# Patient Record
Sex: Male | Born: 1965 | Race: Black or African American | Hispanic: No | Marital: Single | Smoking: Never smoker
Health system: Southern US, Community
[De-identification: ages and names within clinical notes are randomized; demographics above are authoritative.]

## PROBLEM LIST (undated history)

## (undated) DIAGNOSIS — N186 End stage renal disease: Secondary | ICD-10-CM

## (undated) DIAGNOSIS — M4640 Discitis, unspecified, site unspecified: Secondary | ICD-10-CM

## (undated) DIAGNOSIS — E119 Type 2 diabetes mellitus without complications: Secondary | ICD-10-CM

## (undated) DIAGNOSIS — I10 Essential (primary) hypertension: Secondary | ICD-10-CM

## (undated) DIAGNOSIS — Z992 Dependence on renal dialysis: Secondary | ICD-10-CM

## (undated) DIAGNOSIS — Z9189 Other specified personal risk factors, not elsewhere classified: Secondary | ICD-10-CM

## (undated) DIAGNOSIS — G4730 Sleep apnea, unspecified: Secondary | ICD-10-CM

## (undated) DIAGNOSIS — J9621 Acute and chronic respiratory failure with hypoxia: Secondary | ICD-10-CM

## (undated) HISTORY — PX: OTHER SURGICAL HISTORY: SHX169

## (undated) HISTORY — DX: Other specified personal risk factors, not elsewhere classified: Z91.89

---
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
## 2000-02-13 ENCOUNTER — Emergency Department (HOSPITAL_COMMUNITY): Admission: EM | Admit: 2000-02-13 | Discharge: 2000-02-13 | Payer: Self-pay | Admitting: Emergency Medicine

## 2003-08-15 ENCOUNTER — Emergency Department (HOSPITAL_COMMUNITY): Admission: EM | Admit: 2003-08-15 | Discharge: 2003-08-15 | Payer: Self-pay | Admitting: Emergency Medicine

## 2003-10-16 ENCOUNTER — Emergency Department (HOSPITAL_COMMUNITY): Admission: EM | Admit: 2003-10-16 | Discharge: 2003-10-16 | Payer: Self-pay | Admitting: Emergency Medicine

## 2003-10-18 ENCOUNTER — Emergency Department (HOSPITAL_COMMUNITY): Admission: EM | Admit: 2003-10-18 | Discharge: 2003-10-18 | Payer: Self-pay | Admitting: Emergency Medicine

## 2003-10-27 ENCOUNTER — Emergency Department (HOSPITAL_COMMUNITY): Admission: EM | Admit: 2003-10-27 | Discharge: 2003-10-27 | Payer: Self-pay | Admitting: *Deleted

## 2004-05-30 ENCOUNTER — Emergency Department (HOSPITAL_COMMUNITY): Admission: AD | Admit: 2004-05-30 | Discharge: 2004-05-31 | Payer: Self-pay | Admitting: Emergency Medicine

## 2004-06-02 ENCOUNTER — Emergency Department (HOSPITAL_COMMUNITY): Admission: EM | Admit: 2004-06-02 | Discharge: 2004-06-02 | Payer: Self-pay | Admitting: Emergency Medicine

## 2004-06-19 ENCOUNTER — Emergency Department (HOSPITAL_COMMUNITY): Admission: EM | Admit: 2004-06-19 | Discharge: 2004-06-19 | Payer: Self-pay | Admitting: Emergency Medicine

## 2004-06-28 ENCOUNTER — Ambulatory Visit: Payer: Self-pay | Admitting: *Deleted

## 2004-07-20 ENCOUNTER — Ambulatory Visit: Payer: Self-pay | Admitting: Family Medicine

## 2004-07-21 ENCOUNTER — Emergency Department (HOSPITAL_COMMUNITY): Admission: EM | Admit: 2004-07-21 | Discharge: 2004-07-21 | Payer: Self-pay | Admitting: Emergency Medicine

## 2004-07-28 ENCOUNTER — Ambulatory Visit: Payer: Self-pay | Admitting: Family Medicine

## 2004-11-08 ENCOUNTER — Ambulatory Visit: Payer: Self-pay | Admitting: Family Medicine

## 2005-04-01 ENCOUNTER — Emergency Department (HOSPITAL_COMMUNITY): Admission: EM | Admit: 2005-04-01 | Discharge: 2005-04-01 | Payer: Self-pay | Admitting: Emergency Medicine

## 2005-05-25 ENCOUNTER — Ambulatory Visit: Payer: Self-pay | Admitting: Family Medicine

## 2005-07-13 ENCOUNTER — Ambulatory Visit: Payer: Self-pay | Admitting: Family Medicine

## 2005-12-02 ENCOUNTER — Ambulatory Visit: Payer: Self-pay | Admitting: Family Medicine

## 2005-12-17 ENCOUNTER — Emergency Department (HOSPITAL_COMMUNITY): Admission: EM | Admit: 2005-12-17 | Discharge: 2005-12-17 | Payer: Self-pay | Admitting: *Deleted

## 2005-12-18 ENCOUNTER — Emergency Department (HOSPITAL_COMMUNITY): Admission: EM | Admit: 2005-12-18 | Discharge: 2005-12-18 | Payer: Self-pay | Admitting: *Deleted

## 2006-02-03 ENCOUNTER — Ambulatory Visit: Payer: Self-pay | Admitting: Family Medicine

## 2006-03-30 ENCOUNTER — Ambulatory Visit: Payer: Self-pay | Admitting: Family Medicine

## 2006-04-06 ENCOUNTER — Ambulatory Visit: Payer: Self-pay | Admitting: Family Medicine

## 2007-06-07 ENCOUNTER — Telehealth (INDEPENDENT_AMBULATORY_CARE_PROVIDER_SITE_OTHER): Payer: Self-pay | Admitting: *Deleted

## 2007-06-13 ENCOUNTER — Ambulatory Visit: Payer: Self-pay | Admitting: Family Medicine

## 2007-06-20 DIAGNOSIS — I10 Essential (primary) hypertension: Secondary | ICD-10-CM

## 2007-06-20 DIAGNOSIS — Z9189 Other specified personal risk factors, not elsewhere classified: Secondary | ICD-10-CM

## 2007-06-20 DIAGNOSIS — R809 Proteinuria, unspecified: Secondary | ICD-10-CM

## 2007-06-20 DIAGNOSIS — E1159 Type 2 diabetes mellitus with other circulatory complications: Secondary | ICD-10-CM | POA: Insufficient documentation

## 2007-06-20 DIAGNOSIS — E785 Hyperlipidemia, unspecified: Secondary | ICD-10-CM

## 2007-06-20 DIAGNOSIS — E1169 Type 2 diabetes mellitus with other specified complication: Secondary | ICD-10-CM

## 2007-06-20 HISTORY — DX: Other specified personal risk factors, not elsewhere classified: Z91.89

## 2007-06-27 ENCOUNTER — Encounter (INDEPENDENT_AMBULATORY_CARE_PROVIDER_SITE_OTHER): Payer: Self-pay | Admitting: *Deleted

## 2007-11-02 ENCOUNTER — Ambulatory Visit: Payer: Self-pay | Admitting: Nurse Practitioner

## 2007-11-27 ENCOUNTER — Ambulatory Visit: Payer: Self-pay | Admitting: Family Medicine

## 2007-12-03 ENCOUNTER — Encounter (INDEPENDENT_AMBULATORY_CARE_PROVIDER_SITE_OTHER): Payer: Self-pay | Admitting: Family Medicine

## 2007-12-03 LAB — CONVERTED CEMR LAB
ALT: 12 units/L (ref 0–53)
AST: 12 units/L (ref 0–37)
Albumin: 3.7 g/dL (ref 3.5–5.2)
Alkaline Phosphatase: 49 units/L (ref 39–117)
BUN: 16 mg/dL (ref 6–23)
Basophils Absolute: 0 10*3/uL (ref 0.0–0.1)
Basophils Relative: 0 % (ref 0–1)
CO2: 27 meq/L (ref 19–32)
Calcium: 9.3 mg/dL (ref 8.4–10.5)
Chloride: 101 meq/L (ref 96–112)
Cholesterol: 187 mg/dL (ref 0–200)
Creatinine, Ser: 1 mg/dL (ref 0.40–1.50)
Eosinophils Absolute: 0.5 10*3/uL (ref 0.0–0.7)
Eosinophils Relative: 5 % (ref 0–5)
Glucose, Bld: 207 mg/dL — ABNORMAL HIGH (ref 70–99)
HCT: 42.8 % (ref 39.0–52.0)
HDL: 64 mg/dL (ref >39)
Hemoglobin: 13.4 g/dL (ref 13.0–17.0)
LDL Cholesterol: 91 mg/dL (ref 0–99)
Lymphocytes Relative: 36 % (ref 12–46)
Lymphs Abs: 3.2 10*3/uL (ref 0.7–4.0)
MCHC: 31.3 g/dL (ref 30.0–36.0)
MCV: 89.7 fL (ref 78.0–100.0)
Monocytes Absolute: 0.5 10*3/uL (ref 0.1–1.0)
Monocytes Relative: 5 % (ref 3–12)
Neutro Abs: 4.7 10*3/uL (ref 1.7–7.7)
Neutrophils Relative %: 54 % (ref 43–77)
Platelets: 329 10*3/uL (ref 150–400)
Potassium: 4.8 meq/L (ref 3.5–5.3)
RBC: 4.77 M/uL (ref 4.22–5.81)
RDW: 13.3 % (ref 11.5–15.5)
Sodium: 139 meq/L (ref 135–145)
TSH: 2.431 microintl units/mL (ref 0.350–5.50)
Total Bilirubin: 0.5 mg/dL (ref 0.3–1.2)
Total CHOL/HDL Ratio: 2.9
Total Protein: 7.4 g/dL (ref 6.0–8.3)
Triglycerides: 162 mg/dL — ABNORMAL HIGH (ref <150)
VLDL: 32 mg/dL (ref 0–40)
WBC: 8.8 10*3/uL (ref 4.0–10.5)

## 2007-12-11 LAB — CONVERTED CEMR LAB: Hgb A1c MFr Bld: 9.7 % — ABNORMAL HIGH (ref 4.6–6.1)

## 2007-12-17 ENCOUNTER — Ambulatory Visit: Payer: Self-pay | Admitting: Nurse Practitioner

## 2007-12-17 LAB — CONVERTED CEMR LAB: Blood Glucose, Fingerstick: 222

## 2008-06-20 ENCOUNTER — Ambulatory Visit: Payer: Self-pay | Admitting: Nurse Practitioner

## 2008-06-20 LAB — CONVERTED CEMR LAB
Bilirubin Urine: NEGATIVE
Blood Glucose, Fingerstick: 136
Glucose, Urine, Semiquant: NEGATIVE
Hgb A1c MFr Bld: 9.3 %
Nitrite: NEGATIVE
Protein, U semiquant: >=300
Specific Gravity, Urine: >=1.03
Urobilinogen, UA: 1
WBC Urine, dipstick: NEGATIVE
pH: 5.5

## 2011-08-30 ENCOUNTER — Emergency Department (INDEPENDENT_AMBULATORY_CARE_PROVIDER_SITE_OTHER)
Admission: EM | Admit: 2011-08-30 | Discharge: 2011-08-30 | Disposition: A | Discharge status: Nursing Home (Includes ICF) | Payer: Self-pay | Source: Home / Self Care | Attending: Emergency Medicine | Admitting: Emergency Medicine

## 2011-08-30 ENCOUNTER — Encounter: Payer: Self-pay | Admitting: Emergency Medicine

## 2011-08-30 ENCOUNTER — Encounter (HOSPITAL_COMMUNITY): Payer: Self-pay | Admitting: Emergency Medicine

## 2011-08-30 ENCOUNTER — Emergency Department (HOSPITAL_COMMUNITY)
Admission: EM | Admit: 2011-08-30 | Discharge: 2011-08-31 | Disposition: A | Discharge status: Home / Self Care | Payer: Self-pay | Attending: Emergency Medicine | Admitting: Emergency Medicine

## 2011-08-30 DIAGNOSIS — N179 Acute kidney failure, unspecified: Secondary | ICD-10-CM | POA: Insufficient documentation

## 2011-08-30 DIAGNOSIS — Z79899 Other long term (current) drug therapy: Secondary | ICD-10-CM | POA: Insufficient documentation

## 2011-08-30 DIAGNOSIS — I10 Essential (primary) hypertension: Secondary | ICD-10-CM | POA: Insufficient documentation

## 2011-08-30 DIAGNOSIS — E119 Type 2 diabetes mellitus without complications: Secondary | ICD-10-CM | POA: Insufficient documentation

## 2011-08-30 DIAGNOSIS — N19 Unspecified kidney failure: Secondary | ICD-10-CM

## 2011-08-30 DIAGNOSIS — Z794 Long term (current) use of insulin: Secondary | ICD-10-CM | POA: Insufficient documentation

## 2011-08-30 HISTORY — DX: Essential (primary) hypertension: I10

## 2011-08-30 LAB — POCT I-STAT, CHEM 8
BUN: 46 mg/dL — ABNORMAL HIGH (ref 6–23)
Calcium, Ion: 1.1 mmol/L — ABNORMAL LOW (ref 1.12–1.32)
Chloride: 103 mEq/L (ref 96–112)
Creatinine, Ser: 4 mg/dL — ABNORMAL HIGH (ref 0.50–1.35)
Glucose, Bld: 116 mg/dL — ABNORMAL HIGH (ref 70–99)
HCT: 35 % — ABNORMAL LOW (ref 39.0–52.0)
Hemoglobin: 11.9 g/dL — ABNORMAL LOW (ref 13.0–17.0)
Potassium: 4.4 mEq/L (ref 3.5–5.1)
Sodium: 139 mEq/L (ref 135–145)
TCO2: 30 mmol/L (ref 0–100)

## 2011-08-30 LAB — GLUCOSE, CAPILLARY
Glucose-Capillary: 135 mg/dL — ABNORMAL HIGH (ref 70–99)
Glucose-Capillary: 126 mg/dL — ABNORMAL HIGH (ref 70–99)

## 2011-08-30 MED ORDER — PREDNISOLONE SODIUM PHOSPHATE 15 MG/5ML PO SOLN
ORAL | Status: AC
  Filled 2011-08-30: qty 1

## 2011-08-30 MED ORDER — ALBUTEROL SULFATE (5 MG/ML) 0.5% IN NEBU
INHALATION_SOLUTION | RESPIRATORY_TRACT | Status: AC
  Filled 2011-08-30: qty 0.5

## 2011-08-30 NOTE — ED Provider Notes (Cosign Needed)
History     CSN: [id number] Arrival date & time: 08/30/2011  6:12 PM   First MD Initiated Contact with Patient 08/30/11 1632      Chief Complaint  Patient presents with  . Allergic Reaction    HPI Comments: Pt with intermittent tingling in hands, thigh cramps lasting seconds and resolving x 2 weeks. Pt recently started on lasix, amlodipine, vitamin D2 by PMD at the [place]. Also started exercising and has changed his diet during this time. No CP, palpitations, cough, wheeze, SOB, abd pain, urinary c/o, change in urine color, periorbital tingling, diarrhea, constipation. No presyncope, syncope. States glucose greatly improved since he started exercising and watching what he ate. Has been running in 100's- 130's.   Patient is a 45 y.o. male presenting with allergic reaction. The history is provided by the patient.  Allergic Reaction The primary symptoms do not include wheezing, shortness of breath, cough or dizziness. The current episode started more than 2 days ago. The problem has not changed since onset.This is a new problem.  The onset of the reaction was associated with a new medication. Significant symptoms that are not present include eye redness, flushing, rhinorrhea or itching.    Past Medical History  Diagnosis Date  . Diabetes mellitus   . Hypertension     History reviewed. No pertinent past surgical history.  History reviewed. No pertinent family history.  History  Substance Use Topics  . Smoking status: Never Smoker   . Smokeless tobacco: Not on file  . Alcohol Use: No      Review of Systems  Constitutional: Negative for fever and fatigue.  HENT: Negative for rhinorrhea, trouble swallowing and voice change.   Eyes: Negative for redness.  Respiratory: Negative for cough, chest tightness, shortness of breath and wheezing.   Cardiovascular: Negative for chest pain and leg swelling.  Gastrointestinal: Negative.   Genitourinary: Negative.   Skin: Negative for  flushing, itching and pallor.  Neurological: Negative for dizziness, tremors and weakness.    Allergies  Review of patient's allergies indicates no known allergies.  Home Medications   Current Outpatient Rx  Name Route Sig Dispense Refill  . AMLODIPINE BESYLATE 5 MG PO TABS Oral Take 5 mg by mouth daily.      . ERGOCALCIFEROL 50000 UNITS PO CAPS Oral Take 50,000 Units by mouth once a week.      . FE FUMARATE-B12-VIT C-FA-IFC PO CAPS Oral Take 1 capsule by mouth daily before breakfast.      . FUROSEMIDE 20 MG PO TABS Oral Take 20 mg by mouth 2 (two) times daily.      Marland Kitchen PRAVASTATIN SODIUM 20 MG PO TABS Oral Take 20 mg by mouth daily.      Marland Kitchen VITAMIN D (ERGOCALCIFEROL) 50000 UNITS PO CAPS Oral Take 50,000 Units by mouth.        BP 180/82  Pulse 97  Temp(Src) 97.9 F (36.6 C) (Oral)  Resp 18  SpO2 98%  Physical Exam  Nursing note and vitals reviewed. Constitutional: He is oriented to person, place, and time. He appears well-developed and well-nourished.  HENT:  Head: Normocephalic and atraumatic.  Nose: Nose normal.  Eyes: Conjunctivae and EOM are normal. Pupils are equal, round, and reactive to light.  Neck: Normal range of motion. Neck supple.  Cardiovascular: Normal rate, regular rhythm, normal heart sounds and intact distal pulses.  Exam reveals no gallop and no friction rub.   No murmur heard. Pulmonary/Chest: Effort normal and breath sounds normal. No  respiratory distress.  Abdominal: Soft. Bowel sounds are normal. He exhibits no distension. There is no tenderness. There is no rebound and no guarding.  Musculoskeletal: Normal range of motion. He exhibits no edema and no tenderness.  Neurological: He is alert and oriented to person, place, and time. Coordination normal.  Skin: Skin is warm and dry.  Psychiatric: He has a normal mood and affect. His behavior is normal. Judgment and thought content normal.    ED Course  Procedures (including critical care time)  Labs  Reviewed  GLUCOSE, CAPILLARY - Abnormal; Notable for the following:    Glucose-Capillary 135 (*)    All other components within normal limits  POCT I-STAT, CHEM 8 - Abnormal; Notable for the following:    BUN 46 (*)    Creatinine, Ser 4.00 (*)    Glucose, Bld 116 (*)    Calcium, Ion 1.10 (*)    Hemoglobin 11.9 (*)    HCT 35.0 (*)    All other components within normal limits  POCT CBG MONITORING  I-STAT, CHEM 8   No results found.   1. Acute renal failure (ARF)     MDM   Pt in ARF. Suspect lasix, dehydration from increased physical activity.  Prev BUN/cr normal. K normal. Transferring to ED.  Cherly Beach, MD 08/30/11 2049

## 2011-08-30 NOTE — ED Notes (Signed)
Pt. Stated, I think I'm having a allergic reaction or something to the new medicine I'm taking Lasix now.  My hands feel tight and my legs will cramp.

## 2011-08-30 NOTE — ED Notes (Signed)
PT. TRANSFERRED FROM [facility] FOR FURTHER EVALUATION , BLOOD TEST DONE AT URGENT CARE - ELEVATED BUN/CREATININE , DENIES PAIN OR DISCOMFORT.

## 2011-08-31 LAB — GLUCOSE, CAPILLARY: Glucose-Capillary: 126 mg/dL — ABNORMAL HIGH (ref 70–99)

## 2011-08-31 MED ORDER — AZITHROMYCIN 250 MG PO TABS: 250.0000 mg | ORAL_TABLET | Freq: Every day | ORAL | Status: DC

## 2011-08-31 MED ORDER — ALBUTEROL SULFATE HFA 108 (90 BASE) MCG/ACT IN AERS: 1.0000 | INHALATION_SPRAY | Freq: Four times a day (QID) | RESPIRATORY_TRACT | Status: DC | PRN

## 2011-08-31 NOTE — ED Notes (Signed)
Pt states he was feeling jittery earlier, but feels fine now. Pt denies pain, sob, or any other discomfort. Pt states [facility] and he is following up in 3 days as per his and PA's discussion.

## 2011-08-31 NOTE — ED Provider Notes (Signed)
History     CSN: [id number] Arrival date & time: 08/30/2011  9:42 PM   First MD Initiated Contact with Patient 08/31/11 0011      Chief Complaint  Patient presents with  . Allergic Reaction    (Consider location/radiation/quality/duration/timing/severity/associated sxs/prior treatment) Patient is a 45 y.o. male presenting with allergic reaction. The history is provided by the patient.  Allergic Reaction    Pt presents to the Emergency Department as a transfer from the Urgent Care for Acute Renal failure. The patient states that he takes lasix and that he recently started some other medications. He says that he has a PCP at the [place]. He denies chest pain, coughing, wheezing, lower extremity pain.  Past Medical History  Diagnosis Date  . Diabetes mellitus   . Hypertension     History reviewed. No pertinent past surgical history.  No family history on file.  History  Substance Use Topics  . Smoking status: Never Smoker   . Smokeless tobacco: Not on file  . Alcohol Use: No      Review of Systems  All other systems reviewed and are negative.    Allergies  Review of patient's allergies indicates no known allergies.  Home Medications   Current Outpatient Rx  Name Route Sig Dispense Refill  . AMLODIPINE BESYLATE 5 MG PO TABS Oral Take 5 mg by mouth daily.      . ERGOCALCIFEROL 50000 UNITS PO CAPS Oral Take 50,000 Units by mouth once a week.      . FE FUMARATE-B12-VIT C-FA-IFC PO CAPS Oral Take 1 capsule by mouth daily before breakfast.      . FUROSEMIDE 20 MG PO TABS Oral Take 20 mg by mouth daily.     . INSULIN ASPART 100 UNIT/ML [place] SOLN Subcutaneous Inject 20 Units into the skin 3 (three) times daily before meals. Sliding scale based on cbg     . INSULIN GLARGINE 100 UNIT/ML [place] SOLN Subcutaneous Inject 60 Units into the skin at bedtime.      Marland Kitchen PRAVASTATIN SODIUM 20 MG PO TABS Oral Take 20 mg by mouth daily.      Marland Kitchen VITAMIN D (ERGOCALCIFEROL) 50000 UNITS PO CAPS Oral  Take 50,000 Units by mouth daily.       BP 198/105  Pulse 86  Temp(Src) 97.4 F (36.3 C) (Oral)  SpO2 100%  Physical Exam  Nursing note and vitals reviewed. Constitutional: He is oriented to person, place, and time. He appears well-developed and well-nourished.  HENT:  Head: Normocephalic and atraumatic.  Eyes: EOM are normal. Pupils are equal, round, and reactive to light.  Neck: Normal range of motion.  Cardiovascular: Normal rate and regular rhythm.   Pulmonary/Chest: Effort normal and breath sounds normal.  Musculoskeletal: Normal range of motion.  Neurological: He is alert and oriented to person, place, and time.  Skin: Skin is warm and dry.    ED Course  Procedures (including critical care time)  Labs Reviewed  GLUCOSE, CAPILLARY - Abnormal; Notable for the following:    Glucose-Capillary 126 (*)    All other components within normal limits  POCT CBG MONITORING   No results found.   No diagnosis found.    MDM  Pt has known renal failure, no complaints at the moment, felt jittery earlier today. Will see PCP Dec 10, has agreed to return to ER or [facility]gent Care for lab recheck in 2-3 days and sooner if complications. Has been instructed to D/C lasix. Pt is stable. Pts potassium  is within normal range.        Linus Mako, PA 08/31/11 [place], PA 08/31/11 [id number]

## 2011-08-31 NOTE — ED Provider Notes (Signed)
Medical screening examination/treatment/procedure(s) were performed by non-physician practitioner and as supervising physician I was immediately available for consultation/collaboration.   Blanchie Dessert, MD 08/31/11 [phone]

## 2011-09-05 ENCOUNTER — Emergency Department (INDEPENDENT_AMBULATORY_CARE_PROVIDER_SITE_OTHER)
Admission: EM | Admit: 2011-09-05 | Discharge: 2011-09-05 | Disposition: A | Discharge status: Home / Self Care | Payer: Self-pay | Source: Home / Self Care | Attending: Family Medicine | Admitting: Family Medicine

## 2011-09-05 ENCOUNTER — Encounter (HOSPITAL_COMMUNITY): Payer: Self-pay

## 2011-09-05 DIAGNOSIS — N289 Disorder of kidney and ureter, unspecified: Secondary | ICD-10-CM

## 2011-09-05 LAB — POCT I-STAT, CHEM 8
Creatinine, Ser: 3.7 mg/dL — ABNORMAL HIGH (ref 0.50–1.35)
Hemoglobin: 12.9 g/dL — ABNORMAL LOW (ref 13.0–17.0)
Sodium: 137 mEq/L (ref 135–145)
TCO2: 29 mmol/L (ref 0–100)

## 2011-09-05 NOTE — ED Provider Notes (Signed)
History     CSN: [id number] Arrival date & time: 09/05/2011  1:42 PM   First MD Initiated Contact with Patient 09/05/11 1322      Chief Complaint  Patient presents with  . Follow-up    (Consider location/radiation/quality/duration/timing/severity/associated sxs/prior treatment) HPI Comments: Patient here for recheck of bun /creatine. Hx of acute renal failure. See old notes . Denies problems. [facility] Has stopped his [facility].   The history is provided by the patient.    Past Medical History  Diagnosis Date  . Diabetes mellitus   . Hypertension     History reviewed. No pertinent past surgical history.  History reviewed. No pertinent family history.  History  Substance Use Topics  . Smoking status: Never Smoker   . Smokeless tobacco: Not on file  . Alcohol Use: No      Review of Systems  Constitutional: Negative.   HENT: Negative.   Respiratory: Negative.   Cardiovascular: Negative.   Gastrointestinal: Negative.   Musculoskeletal: Negative.   Neurological: Negative.     Allergies  Review of patient's allergies indicates no known allergies.  Home Medications   Current Outpatient Rx  Name Route Sig Dispense Refill  . AMLODIPINE BESYLATE 5 MG PO TABS Oral Take 5 mg by mouth daily.      . ERGOCALCIFEROL 50000 UNITS PO CAPS Oral Take 50,000 Units by mouth once a week.      . FE FUMARATE-B12-VIT C-FA-IFC PO CAPS Oral Take 1 capsule by mouth daily before breakfast.      . INSULIN ASPART 100 UNIT/ML [place] SOLN Subcutaneous Inject 20 Units into the skin 3 (three) times daily before meals. Sliding scale based on cbg     . INSULIN GLARGINE 100 UNIT/ML [place] SOLN Subcutaneous Inject 60 Units into the skin at bedtime.      Marland Kitchen PRAVASTATIN SODIUM 20 MG PO TABS Oral Take 20 mg by mouth daily.      Marland Kitchen VITAMIN D (ERGOCALCIFEROL) 50000 UNITS PO CAPS Oral Take 50,000 Units by mouth daily.     . FUROSEMIDE 20 MG PO TABS Oral Take 20 mg by mouth daily.       BP  174/91  Pulse 80  Temp(Src) 98.8 F (37.1 C) (Oral)  Resp 16  SpO2 95%  Physical Exam  Nursing note and vitals reviewed. Constitutional: He appears well-developed and well-nourished. No distress.  Cardiovascular: Normal rate.   Pulmonary/Chest: Effort normal.  Musculoskeletal: He exhibits no edema.    ED Course  Procedures (including critical care time)  Labs Reviewed  POCT I-STAT, CHEM 8 - Abnormal; Notable for the following:    BUN 43 (*)    Creatinine, Ser 3.70 (*)    Glucose, Bld 127 (*)    Calcium, Ion 1.10 (*)    Hemoglobin 12.9 (*)    HCT 38.0 (*)    All other components within normal limits  I-STAT, CHEM 8   No results found.   1. Renal insufficiency       MDM          Luna Glasgow, MD 09/05/11 1430

## 2011-09-05 NOTE — ED Notes (Signed)
Pt states he was seen here 5 days ago and told his kidneys were not functioning properly.  He was instructed to stop furosemide and return in 5 days for recheck

## 2012-01-12 ENCOUNTER — Emergency Department (INDEPENDENT_AMBULATORY_CARE_PROVIDER_SITE_OTHER)
Admission: EM | Admit: 2012-01-12 | Discharge: 2012-01-12 | Disposition: A | Discharge status: Home Health | Payer: Self-pay | Source: Home / Self Care | Attending: Family Medicine | Admitting: Family Medicine

## 2012-01-12 ENCOUNTER — Encounter (HOSPITAL_COMMUNITY): Payer: Self-pay

## 2012-01-12 DIAGNOSIS — J40 Bronchitis, not specified as acute or chronic: Secondary | ICD-10-CM

## 2012-01-12 DIAGNOSIS — J069 Acute upper respiratory infection, unspecified: Secondary | ICD-10-CM

## 2012-01-12 MED ORDER — GUAIFENESIN-CODEINE 100-10 MG/5ML PO SYRP: ORAL_SOLUTION | ORAL | Status: DC

## 2012-01-12 MED ORDER — AZITHROMYCIN 250 MG PO TABS: 250.0000 mg | ORAL_TABLET | Freq: Every day | ORAL | Status: AC

## 2012-01-12 NOTE — Discharge Instructions (Signed)
Tylenol or motrin  As needed. Only take chlorcedin hbp for decongestant. Avoid caffeine and milk products.

## 2012-01-12 NOTE — ED Provider Notes (Signed)
History     CSN: [id number]  Arrival date & time 01/12/12  1048   First MD Initiated Contact with Patient 01/12/12 1149      Chief Complaint  Patient presents with  . Cough    (Consider location/radiation/quality/duration/timing/severity/associated sxs/prior treatment) HPI Comments: Patient reports he has had a slight cough for the past 2 wks. Now with sinus congestin. Has a lot of post nasal drainage. No fever. 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 He took a decongestant his boss gave him today. Does not know the name. He states his cough has occasionally been productive.   The history is provided by the patient.    Past Medical History  Diagnosis Date  . Diabetes mellitus   . Hypertension     History reviewed. No pertinent past surgical history.  History reviewed. No pertinent family history.  History  Substance Use Topics  . Smoking status: Never Smoker   . Smokeless tobacco: Not on file  . Alcohol Use: No      Review of Systems  Constitutional: Negative.   HENT: Positive for congestion, rhinorrhea, sneezing and postnasal drip. Negative for sore throat.   Respiratory: Positive for cough. Negative for wheezing.   Cardiovascular: Negative.   Gastrointestinal: Negative.   Genitourinary: Negative.   Skin: Negative.     Allergies  Review of patient's allergies indicates no known allergies.  Home Medications   Current Outpatient Rx  Name Route Sig Dispense Refill  . AMLODIPINE BESYLATE 5 MG PO TABS Oral Take 5 mg by mouth daily.      . AZITHROMYCIN 250 MG PO TABS Oral Take 1 tablet (250 mg total) by mouth daily. Take first 2 tablets together, then 1 every day until finished. 6 tablet 0  . ERGOCALCIFEROL 50000 UNITS PO CAPS Oral Take 50,000 Units by mouth once a week.      . FE FUMARATE-B12-VIT C-FA-IFC PO CAPS Oral Take 1 capsule by mouth daily before breakfast.      . GUAIFENESIN-CODEINE 100-10 MG/5ML PO  SYRP  1-2 tsp po q 6 hrs prn cough 120 mL 0  . INSULIN ASPART 100 UNIT/ML [place] SOLN Subcutaneous Inject 20 Units into the skin 3 (three) times daily before meals. Sliding scale based on cbg     . INSULIN GLARGINE 100 UNIT/ML [place] SOLN Subcutaneous Inject 60 Units into the skin at bedtime.      Marland Kitchen PRAVASTATIN SODIUM 20 MG PO TABS Oral Take 20 mg by mouth daily.      Marland Kitchen VITAMIN D (ERGOCALCIFEROL) 50000 UNITS PO CAPS Oral Take 50,000 Units by mouth daily.       BP 196/71  Pulse 90  Temp(Src) 98.2 F (36.8 C) (Oral)  Resp 20  SpO2 96%  Physical Exam  Nursing note and vitals reviewed. Constitutional: He appears well-developed and well-nourished. No distress.       Obese. bp elevated. States he took medication today. Admits to taking a decongestant his boss gave him.   HENT:       Ears clear, nose congested, throat mild erythema. Post nasal drainage noted.   Cardiovascular: Normal rate and regular rhythm.   Pulmonary/Chest: Effort normal and breath sounds normal. He has no wheezes.       Congested cough  Skin: Skin is warm and dry.    ED Course  Procedures (including critical care time)  Labs Reviewed - No data to display No results  found.   1. URI (upper respiratory infection)   2. Bronchitis       MDM          Luna Glasgow, MD 01/12/12 [phone]

## 2012-01-12 NOTE — ED Notes (Signed)
Pt c/o cough for past couple of weeks.  Pt states cough is productive with white sputum.  Pt denies fever.

## 2012-04-30 ENCOUNTER — Inpatient Hospital Stay (HOSPITAL_COMMUNITY)
Admission: EM | Admit: 2012-04-30 | Discharge: 2012-05-05 | DRG: 673 | Disposition: A | Discharge status: Home / Self Care | Payer: Non-veteran care | Attending: Internal Medicine | Admitting: Internal Medicine

## 2012-04-30 ENCOUNTER — Inpatient Hospital Stay (HOSPITAL_COMMUNITY): Payer: Non-veteran care

## 2012-04-30 ENCOUNTER — Emergency Department (HOSPITAL_COMMUNITY): Payer: Non-veteran care

## 2012-04-30 ENCOUNTER — Encounter (HOSPITAL_COMMUNITY): Payer: Self-pay | Admitting: Emergency Medicine

## 2012-04-30 DIAGNOSIS — I10 Essential (primary) hypertension: Secondary | ICD-10-CM | POA: Diagnosis present

## 2012-04-30 DIAGNOSIS — M948X9 Other specified disorders of cartilage, unspecified sites: Secondary | ICD-10-CM | POA: Diagnosis present

## 2012-04-30 DIAGNOSIS — E1169 Type 2 diabetes mellitus with other specified complication: Secondary | ICD-10-CM | POA: Diagnosis present

## 2012-04-30 DIAGNOSIS — Z79899 Other long term (current) drug therapy: Secondary | ICD-10-CM

## 2012-04-30 DIAGNOSIS — Z7982 Long term (current) use of aspirin: Secondary | ICD-10-CM

## 2012-04-30 DIAGNOSIS — Z6841 Body Mass Index (BMI) 40.0 and over, adult: Secondary | ICD-10-CM

## 2012-04-30 DIAGNOSIS — Z794 Long term (current) use of insulin: Secondary | ICD-10-CM

## 2012-04-30 DIAGNOSIS — D649 Anemia, unspecified: Secondary | ICD-10-CM

## 2012-04-30 DIAGNOSIS — E875 Hyperkalemia: Secondary | ICD-10-CM | POA: Diagnosis present

## 2012-04-30 DIAGNOSIS — N185 Chronic kidney disease, stage 5: Secondary | ICD-10-CM

## 2012-04-30 DIAGNOSIS — I120 Hypertensive chronic kidney disease with stage 5 chronic kidney disease or end stage renal disease: Principal | ICD-10-CM | POA: Diagnosis present

## 2012-04-30 DIAGNOSIS — N186 End stage renal disease: Secondary | ICD-10-CM | POA: Diagnosis present

## 2012-04-30 DIAGNOSIS — Z992 Dependence on renal dialysis: Secondary | ICD-10-CM | POA: Diagnosis present

## 2012-04-30 DIAGNOSIS — E162 Hypoglycemia, unspecified: Secondary | ICD-10-CM

## 2012-04-30 DIAGNOSIS — I509 Heart failure, unspecified: Secondary | ICD-10-CM

## 2012-04-30 DIAGNOSIS — D509 Iron deficiency anemia, unspecified: Secondary | ICD-10-CM | POA: Diagnosis present

## 2012-04-30 DIAGNOSIS — D631 Anemia in chronic kidney disease: Secondary | ICD-10-CM | POA: Diagnosis present

## 2012-04-30 DIAGNOSIS — I152 Hypertension secondary to endocrine disorders: Secondary | ICD-10-CM | POA: Diagnosis present

## 2012-04-30 DIAGNOSIS — E46 Unspecified protein-calorie malnutrition: Secondary | ICD-10-CM | POA: Diagnosis present

## 2012-04-30 DIAGNOSIS — E119 Type 2 diabetes mellitus without complications: Secondary | ICD-10-CM

## 2012-04-30 DIAGNOSIS — N179 Acute kidney failure, unspecified: Secondary | ICD-10-CM

## 2012-04-30 LAB — PRO B NATRIURETIC PEPTIDE: Pro B Natriuretic peptide (BNP): 15902 pg/mL — ABNORMAL HIGH (ref 0–125)

## 2012-04-30 LAB — COMPREHENSIVE METABOLIC PANEL
ALT: 31 U/L (ref 0–53)
AST: 29 U/L (ref 0–37)
Albumin: 2.9 g/dL — ABNORMAL LOW (ref 3.5–5.2)
Calcium: 6.3 mg/dL — CL (ref 8.4–10.5)
Sodium: 138 mEq/L (ref 135–145)
Total Protein: 7.3 g/dL (ref 6.0–8.3)

## 2012-04-30 LAB — GLUCOSE, CAPILLARY
Glucose-Capillary: 127 mg/dL — ABNORMAL HIGH (ref 70–99)
Glucose-Capillary: 86 mg/dL (ref 70–99)
Glucose-Capillary: 69 mg/dL — ABNORMAL LOW (ref 70–99)
Glucose-Capillary: 85 mg/dL (ref 70–99)
Glucose-Capillary: 52 mg/dL — ABNORMAL LOW (ref 70–99)
Glucose-Capillary: 103 mg/dL — ABNORMAL HIGH (ref 70–99)
Glucose-Capillary: 75 mg/dL (ref 70–99)

## 2012-04-30 LAB — APTT: aPTT: 46 seconds — ABNORMAL HIGH (ref 24–37)

## 2012-04-30 LAB — URINALYSIS, ROUTINE W REFLEX MICROSCOPIC
Glucose, UA: NEGATIVE mg/dL
Ketones, ur: NEGATIVE mg/dL
Protein, ur: >300 mg/dL — AB
Urobilinogen, UA: 0.2 mg/dL (ref 0.0–1.0)

## 2012-04-30 LAB — CBC WITH DIFFERENTIAL/PLATELET
Basophils Absolute: 0.1 10*3/uL (ref 0.0–0.1)
Basophils Relative: 1 % (ref 0–1)
Eosinophils Absolute: 0.1 10*3/uL (ref 0.0–0.7)
Eosinophils Relative: 1 % (ref 0–5)
Lymphocytes Relative: 11 % — ABNORMAL LOW (ref 12–46)
MCHC: 33.1 g/dL (ref 30.0–36.0)
MCV: 84.7 fL (ref 78.0–100.0)
Platelets: 267 10*3/uL (ref 150–400)
RDW: 14.4 % (ref 11.5–15.5)
WBC: 9.8 10*3/uL (ref 4.0–10.5)

## 2012-04-30 LAB — URINE MICROSCOPIC-ADD ON

## 2012-04-30 LAB — FOLATE: Folate: 12.4 ng/mL

## 2012-04-30 LAB — PHOSPHORUS: Phosphorus: 7.9 mg/dL — ABNORMAL HIGH (ref 2.3–4.6)

## 2012-04-30 LAB — VITAMIN B12: Vitamin B-12: 735 pg/mL (ref 211–911)

## 2012-04-30 LAB — RETICULOCYTES
RBC.: 3.08 MIL/uL — ABNORMAL LOW (ref 4.22–5.81)
Retic Count, Absolute: 55.4 10*3/uL (ref 19.0–186.0)

## 2012-04-30 LAB — ALT: ALT: 25 U/L (ref 0–53)

## 2012-04-30 LAB — PROTIME-INR: INR: 1.11 (ref 0.00–1.49)

## 2012-04-30 MED ORDER — SODIUM CHLORIDE 0.9 % IJ SOLN
3.0000 mL | Freq: Two times a day (BID) | INTRAMUSCULAR | Status: DC
  Administered 2012-04-30 – 2012-05-04 (×9): 3 mL via INTRAVENOUS

## 2012-04-30 MED ORDER — DOCUSATE SODIUM 100 MG PO CAPS
100.0000 mg | ORAL_CAPSULE | Freq: Two times a day (BID) | ORAL | Status: DC
  Administered 2012-04-30 – 2012-05-04 (×7): 100 mg via ORAL
  Filled 2012-04-30 (×12): qty 1

## 2012-04-30 MED ORDER — SODIUM CHLORIDE 0.9 % IJ SOLN: 3.0000 mL | Freq: Two times a day (BID) | INTRAMUSCULAR | Status: DC

## 2012-04-30 MED ORDER — FUROSEMIDE 10 MG/ML IJ SOLN
40.0000 mg | Freq: Once | INTRAMUSCULAR | Status: AC
  Administered 2012-04-30: 40 mg via INTRAVENOUS
  Filled 2012-04-30: qty 4

## 2012-04-30 MED ORDER — FUROSEMIDE 10 MG/ML IJ SOLN
160.0000 mg | Freq: Four times a day (QID) | INTRAVENOUS | Status: DC
  Administered 2012-04-30 – 2012-05-03 (×11): 160 mg via INTRAVENOUS
  Filled 2012-04-30 (×19): qty 16

## 2012-04-30 MED ORDER — MIDAZOLAM HCL 2 MG/2ML IJ SOLN
INTRAMUSCULAR | Status: AC
  Filled 2012-04-30: qty 4

## 2012-04-30 MED ORDER — ONDANSETRON HCL 4 MG/2ML IJ SOLN: 4.0000 mg | Freq: Four times a day (QID) | INTRAMUSCULAR | Status: DC | PRN

## 2012-04-30 MED ORDER — MIDAZOLAM HCL 5 MG/5ML IJ SOLN
INTRAMUSCULAR | Status: DC | PRN
  Administered 2012-04-30: 1 mg via INTRAVENOUS

## 2012-04-30 MED ORDER — ACETAMINOPHEN 325 MG PO TABS
650.0000 mg | ORAL_TABLET | Freq: Four times a day (QID) | ORAL | Status: DC | PRN
  Administered 2012-05-03: 650 mg via ORAL
  Filled 2012-04-30: qty 2

## 2012-04-30 MED ORDER — FENTANYL CITRATE 0.05 MG/ML IJ SOLN
INTRAMUSCULAR | Status: AC | PRN
  Administered 2012-04-30 (×2): 50 ug via INTRAVENOUS

## 2012-04-30 MED ORDER — INSULIN ASPART 100 UNIT/ML ~~LOC~~ SOLN
0.0000 [IU] | Freq: Three times a day (TID) | SUBCUTANEOUS | Status: DC
  Administered 2012-05-01: 2 [IU] via SUBCUTANEOUS

## 2012-04-30 MED ORDER — DEXTROSE 50 % IV SOLN
25.0000 mL | Freq: Once | INTRAVENOUS | Status: AC | PRN
  Administered 2012-04-30: 25 mL via INTRAVENOUS

## 2012-04-30 MED ORDER — CALCIUM CARBONATE 1250 (500 CA) MG PO TABS
500.0000 mg | ORAL_TABLET | Freq: Three times a day (TID) | ORAL | Status: DC
  Administered 2012-05-01 – 2012-05-05 (×10): 500 mg via ORAL
  Filled 2012-04-30 (×17): qty 1

## 2012-04-30 MED ORDER — CEFAZOLIN SODIUM 1-5 GM-% IV SOLN
1.0000 g | Freq: Three times a day (TID) | INTRAVENOUS | Status: DC
  Administered 2012-04-30 – 2012-05-02 (×6): 1 g via INTRAVENOUS
  Filled 2012-04-30 (×8): qty 50

## 2012-04-30 MED ORDER — FENTANYL CITRATE 0.05 MG/ML IJ SOLN
INTRAMUSCULAR | Status: AC
  Filled 2012-04-30: qty 4

## 2012-04-30 MED ORDER — MIDAZOLAM HCL 5 MG/5ML IJ SOLN
INTRAMUSCULAR | Status: AC | PRN
  Administered 2012-04-30: 2 mg via INTRAVENOUS
  Administered 2012-04-30: 1 mg via INTRAVENOUS

## 2012-04-30 MED ORDER — DEXTROSE 50 % IV SOLN
INTRAVENOUS | Status: AC
  Administered 2012-04-30: 25 mL
  Filled 2012-04-30: qty 50

## 2012-04-30 MED ORDER — ASPIRIN EC 81 MG PO TBEC
81.0000 mg | DELAYED_RELEASE_TABLET | Freq: Every day | ORAL | Status: DC
  Administered 2012-04-30 – 2012-05-05 (×6): 81 mg via ORAL
  Filled 2012-04-30 (×6): qty 1

## 2012-04-30 MED ORDER — CEFAZOLIN SODIUM 1-5 GM-% IV SOLN
INTRAVENOUS | Status: AC
  Filled 2012-04-30: qty 50

## 2012-04-30 MED ORDER — ONDANSETRON HCL 4 MG PO TABS: 4.0000 mg | ORAL_TABLET | Freq: Four times a day (QID) | ORAL | Status: DC | PRN

## 2012-04-30 MED ORDER — ACETAMINOPHEN 650 MG RE SUPP: 650.0000 mg | Freq: Four times a day (QID) | RECTAL | Status: DC | PRN

## 2012-04-30 MED ORDER — HEPARIN SODIUM (PORCINE) 1000 UNIT/ML IJ SOLN
INTRAMUSCULAR | Status: AC
  Filled 2012-04-30: qty 1

## 2012-04-30 MED ORDER — ONDANSETRON HCL 4 MG/2ML IJ SOLN: 4.0000 mg | Freq: Three times a day (TID) | INTRAMUSCULAR | Status: DC | PRN

## 2012-04-30 MED ORDER — SODIUM CHLORIDE 0.9 % IJ SOLN: 3.0000 mL | INTRAMUSCULAR | Status: DC | PRN

## 2012-04-30 MED ORDER — INSULIN GLARGINE 100 UNIT/ML ~~LOC~~ SOLN: 50.0000 [IU] | Freq: Every day | SUBCUTANEOUS | Status: DC

## 2012-04-30 NOTE — Procedures (Signed)
Successful placement of right IJ approach dialysis catheter with tip at superior aspect of the right atrium. The catheter is ready for immediate use. No immediate post procedural complications.

## 2012-04-30 NOTE — Progress Notes (Signed)
CBG: 52  Treatment: D50 IV 25 mL  Symptoms: None  Follow-up CBG: [id number] CBG Result:70  Possible Reasons for Event: Other: NPO  Comments/MD notified:Monitor, Pt off unit for recheck    Seth [name] Michael [place]

## 2012-04-30 NOTE — H&P (Signed)
Hospital Admission Note Date: 04/30/2012  Patient name: Seth [name] Medical record number: [id number] Date of birth: July 16, 1966 Age: 46 y.o. Gender: male PCP: [facility] Service: Internal Medicine Teaching Service, [facility] Service  Attending physician:  Dr. Larey Dresser    1st Contact: Dr. Jule Ser  Pager: [phone] 2nd Contact: Dr. Chilton Greathouse Pager: [phone] After 5 pm or weekends: 1st Contact:      Pager: [phone] 2nd Contact:      Pager: [phone]  Chief Complaint: Shortness of breath, increased lower extremity swelling  History of Present Illness:Seth [name] is a 46 year old man with a PMH significant for Diabetes Mellitus type II, Hypertension, Hyperlipidemia, morbid obesity, and chronic kidney disease with a baseline creatinine of about 3.5-4 who presents to the [facility] ED with complaints of about 1 week of increasing shortness of breath with minimal exertion.  He states that over the last week he has had problems with becoming very short of breath with minimal exertion, problems breathing at night, problems breathing while lying flat, and increased swelling in his legs bilaterally.  He denies any chest pain over this time, increasing fullness in his abdomen, fever, chills, vomiting, sleepiness, or cough.  He does state that he has been feeling more weak lately, more tired during the day, and has had a vague feeling of nausea.  He follows with the [facility] and gets his medications through them.    Meds: Medications Prior to Admission  Medication Sig Dispense Refill  . amLODipine (NORVASC) 10 MG tablet Take 10 mg by mouth daily.      Marland Kitchen aspirin EC 81 MG tablet Take 81 mg by mouth daily.      Marland Kitchen CALCIUM CARBONATE PO Take 1 tablet by mouth 3 (three) times daily.      . ferrous sulfate 325 (65 FE) MG tablet Take 325 mg by mouth daily with breakfast.      . insulin aspart (NOVOLOG) 100 UNIT/ML injection Inject 20-30 Units into the skin 3 (three) times daily before  meals. Sliding scale based on cbg      . insulin glargine (LANTUS) 100 UNIT/ML injection Inject 60 Units into the skin at bedtime.        . Tamsulosin HCl (FLOMAX) 0.4 MG CAPS Take 0.4 mg by mouth daily.       Allergies: Allergies as of 04/30/2012  . (No Known Allergies)   Past Medical History  Diagnosis Date  . Diabetes mellitus   . Hypertension    No past surgical history on file. Family History  Problem Relation Age of Onset  . Diabetes Mother   . Diabetes Sister   . Diabetes Brother    History   Social History  . Marital Status: Single    Spouse Name: N/A    Number of Children: N/A  . Years of Education: N/A   Occupational History  . Not on file.   Social History Main Topics  . Smoking status: Never Smoker   . Smokeless tobacco: Not on file  . Alcohol Use: No  . Drug Use: Yes    Special: Marijuana  . Sexually Active:    Other Topics Concern  . 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  Divorce with 5 children, 3 boys and 2 girls.  Lives in  [place].    Review of Systems: Constitutional: Positive for fatigue.  Denies fever, chills, diaphoresis, appetite change.  HEENT: Denies photophobia, eye pain, redness, hearing loss, ear pain, congestion, sore throat, rhinorrhea, sneezing, mouth sores, trouble swallowing, neck pain, neck stiffness and tinnitus.   Respiratory: Positive for SOB.  Denies DOE, cough, chest tightness,  and wheezing.   Cardiovascular: Denies chest pain, palpitations and leg swelling.  Gastrointestinal: Positive for nausea.  Denies vomiting, abdominal pain, diarrhea, constipation, blood in stool and abdominal distention.  Genitourinary: Denies dysuria, urgency, frequency, hematuria, flank pain and difficulty urinating.  Musculoskeletal: Denies myalgias, back pain, joint swelling,  arthralgias and gait problem.  Skin: Denies pallor, rash and wound.  Neurological: Denies dizziness, seizures, syncope, weakness, light-headedness, numbness and headaches.  Hematological: Denies adenopathy. Easy bruising, personal or family bleeding history  Psychiatric/Behavioral: Denies suicidal ideation, mood changes, confusion, nervousness, sleep disturbance and agitation  Physical Exam: Blood pressure 142/89, pulse 80, temperature 98.4 F (36.9 C), temperature source Oral, resp. rate 23, SpO2 98.00%. Constitutional: Vital signs reviewed.  Patient is a well-developed and well-nourished Ple[place], morbidly obese man in no acute distress and cooperative with exam. Alert and oriented x3.  Head: Normocephalic and atraumatic Ear: TM normal bilaterally Mouth: no erythema or exudates, MMM Eyes: PERRL, EOMI, conjunctivae normal, No scleral icterus.  Neck: Supple, JVD to the angle of the jaw. Trachea midline normal ROM, No mass, thyromegaly, or carotid bruit present.  Cardiovascular: RRR, S1 normal, S2 normal, no MRG, pulses symmetric and intact bilaterally Pulmonary/Chest: difficult to hear given body habitus but mild crackles in the bilateral bases. no wheezes, or rhonchi Abdominal: Soft. Obese, Non-tender, non-distended, bowel sounds are normal, no masses, organomegaly, or guarding present.  GU: no CVA tenderness Musculoskeletal: 3+ pitting edema in the bilateral lower extremities to the mid thigh.  No joint deformities, erythema, or stiffness, ROM full and no nontender Hematology: no cervical, inginal, or axillary adenopathy.  Neurological: A&O x3, Strength is normal and symmetric bilaterally, cranial nerve II-XII are grossly intact, no focal motor deficit, sensory intact to light touch bilaterally.  Skin: Warm, dry and intact. No rash, cyanosis, or clubbing.  Psychiatric: Normal mood and affect. speech and behavior is normal. Judgment and thought content normal. Cognition and memory are normal.    Lab results: Basic Metabolic Panel:  [facility] 04/30/12 0516  NA 138  K 5.4*  CL 99  CO2 23  GLUCOSE 114*  BUN 90*  CREATININE 9.27*  CALCIUM 6.3*  MG --  PHOS --   Liver Function Tests:  [facility] 04/30/12 0516  AST 29  ALT 31  ALKPHOS 55  BILITOT 0.3  PROT 7.3  ALBUMIN 2.9*   CBC:  Basename 04/30/12 0516  WBC 9.8  NEUTROABS 8.2*  HGB 8.6*  HCT 26.0*  MCV 84.7  PLT 267   Cardiac Enzymes:  Basename 04/30/12 0516  CKTOTAL --  CKMB --  CKMBINDEX --  TROPONINI <0.30   BNP:  Basename 04/30/12 0516  PROBNP 15902.0*   CBG:  Basename 04/30/12 0814 04/30/12 0555 04/30/12 0426  GLUCAP 86 127* 47*   Imaging results:  Dg Chest 2 View  04/30/2012  *RADIOLOGY REPORT*  Clinical Data: Drop in blood sugar  CHEST - 2 VIEW  Comparison: Chest radiograph 04/30/2012  Findings: Stable enlarged heart silhouette.  There is central venous congestion similar to prior.  No focal consolidation.  There is mild interstitial edema.  There is ill-defined density in the right upper lobe  IMPRESSION:  1.  Cardiomegaly.  2.  Central venous congestion and mild pulmonary edema. 3.  Nodular density in the left upper lobe.  Recommend follow-up radiographs after acute symptomology resolves to determine if this is a true lesion.  Original Report Authenticated By: Suzy Bouchard, M.D.   Other results: EKG: Sinus rhythm with rate of 86.  QTc 512.  Left atrial enlargement. Lateral T wave inversions, no previous EKG  Assessment & Plan by Problem: 1. Chronic kidney disease, Stage V:  Seth [name] is a 46 year old man with the PMH listed above who presents with increasing shortness of breath and increased lower extremity swelling.  Creatinine today is 9.27 and BUN is 90.  His last known baseline was Cr of 3.70ish and BUN of 43.  He is fluid overloaded on exam today and is having orthopnea because of the fluid overload.  He continues to saturate well on [place] O2 and is very short of breath currently.  His  potassium is 5.4 which is not bad and he has no EKG changes consistent with hyperkalemia.  Bicarb is 23 so he is not overly acidotic.  He has some nausea, weakness, and fatigue but is alert and oriented appropriately.  I spoke with Dr. Justin Mend from [facility] who will see him.  He doesn't have any requirement for emergent dialysis at this time but will likely need to get the ball rolling during this admission.    - Admit to Tele  - Consult Renal (done)  - Attempt to get records from the [facility] intact PTH, renal U/S, Urine protein/creatinine ratio, SPEP, and UPEP.  - Lasix 160 mg IV Q6  - Likely discuss case with Vascular surgery for placement of temporary dialysis access as well as AV fistula  2. Diabetes Mellitus type II:  He is insulin controlled diabetes with no last known A1C.  On arrival to the ED his blood sugar was low.  We will cut back his Lantus today to 50 units and cover him with SSI during his stay.  If he does start dialysis he may need adjustment of his insulin requirements during his stay.  3.  Hypocalcemia:  Likely secondary to his ESRD.  He is on calcium supplements with meals.  Plan to check Intact PTH and phosphorus.    4.  Hypertension:  Blood pressure on arrival to the ED had SBP in the 140s but with administration of Lasix he had a drop in his SBP to 110.  We will hold his Amlodipine and Tamsulosin with the Lasix diuresis and continue to monitor.    5. VTE: SCDs.  Signed: Yazmine Sorey 04/30/2012, 11:54 AM

## 2012-04-30 NOTE — H&P (Signed)
Seth [name] is an 46 y.o. male.   Chief Complaint: aggressive renal failure N; SOB Scheduled for permanent hemodialysis catheter placement HPI: DM; HTN; obese; renal failure  Past Medical History  Diagnosis Date  . Diabetes mellitus   . Hypertension     No past surgical history on file.  Family History  Problem Relation Age of Onset  . Diabetes Mother   . Diabetes Sister   . Diabetes Brother    Social History:  reports that he has never smoked. He does not have any smokeless tobacco history on file. He reports that he uses illicit drugs (Marijuana). He reports that he does not drink alcohol.  Allergies: No Known Allergies  Medications Prior to Admission  Medication Sig Dispense Refill  . amLODipine (NORVASC) 10 MG tablet Take 10 mg by mouth daily.      Marland Kitchen aspirin EC 81 MG tablet Take 81 mg by mouth daily.      Marland Kitchen CALCIUM CARBONATE PO Take 1 tablet by mouth 3 (three) times daily.      . ferrous sulfate 325 (65 FE) MG tablet Take 325 mg by mouth daily with breakfast.      . insulin aspart (NOVOLOG) 100 UNIT/ML injection Inject 20-30 Units into the skin 3 (three) times daily before meals. Sliding scale based on cbg      . insulin glargine (LANTUS) 100 UNIT/ML injection Inject 60 Units into the skin at bedtime.        . Tamsulosin HCl (FLOMAX) 0.4 MG CAPS Take 0.4 mg by mouth daily.        Results for orders placed during the hospital encounter of 04/30/12 (from the past 48 hour(s))  GLUCOSE, CAPILLARY     Status: Abnormal   Collection Time   04/30/12  4:26 AM      Component Value Range Comment   Glucose-Capillary 47 (*) 70 - 99 mg/dL   CBC WITH DIFFERENTIAL     Status: Abnormal   Collection Time   04/30/12  5:16 AM      Component Value Range Comment   WBC 9.8  4.0 - 10.5 K/uL    RBC 3.07 (*) 4.22 - 5.81 MIL/uL    Hemoglobin 8.6 (*) 13.0 - 17.0 g/dL    HCT 26.0 (*) 39.0 - 52.0 %    MCV 84.7  78.0 - 100.0 fL    MCH 28.0  26.0 - 34.0 pg    MCHC 33.1  30.0 - 36.0 g/dL    RDW 14.4  11.5 - 15.5 %    Platelets 267  150 - 400 K/uL    Neutrophils Relative 83 (*) 43 - 77 %    Neutro Abs 8.2 (*) 1.7 - 7.7 K/uL    Lymphocytes Relative 11 (*) 12 - 46 %    Lymphs Abs 1.1  0.7 - 4.0 K/uL    Monocytes Relative 5  3 - 12 %    Monocytes Absolute 0.5  0.1 - 1.0 K/uL    Eosinophils Relative 1  0 - 5 %    Eosinophils Absolute 0.1  0.0 - 0.7 K/uL    Basophils Relative 1  0 - 1 %    Basophils Absolute 0.1  0.0 - 0.1 K/uL   COMPREHENSIVE METABOLIC PANEL     Status: Abnormal   Collection Time   04/30/12  5:16 AM      Component Value Range Comment   Sodium 138  135 - 145 mEq/L    Potassium 5.4 (*)  3.5 - 5.1 mEq/L    Chloride 99  96 - 112 mEq/L    CO2 23  19 - 32 mEq/L    Glucose, Bld 114 (*) 70 - 99 mg/dL    BUN 90 (*) 6 - 23 mg/dL    Creatinine, Ser 9.27 (*) 0.50 - 1.35 mg/dL    Calcium 6.3 (*) 8.4 - 10.5 mg/dL    Total Protein 7.3  6.0 - 8.3 g/dL    Albumin 2.9 (*) 3.5 - 5.2 g/dL    AST 29  0 - 37 U/L    ALT 31  0 - 53 U/L    Alkaline Phosphatase 55  39 - 117 U/L    Total Bilirubin 0.3  0.3 - 1.2 mg/dL    GFR calc non Af Amer 6 (*) >90 mL/min    GFR calc Af Amer 7 (*) >90 mL/min   TROPONIN I     Status: Normal   Collection Time   04/30/12  5:16 AM      Component Value Range Comment   Troponin I <0.30  <0.30 ng/mL   PRO B NATRIURETIC PEPTIDE     Status: Abnormal   Collection Time   04/30/12  5:16 AM      Component Value Range Comment   Pro B Natriuretic peptide (BNP) 15902.0 (*) 0 - 125 pg/mL   GLUCOSE, CAPILLARY     Status: Abnormal   Collection Time   04/30/12  5:55 AM      Component Value Range Comment   Glucose-Capillary 127 (*) 70 - 99 mg/dL   GLUCOSE, CAPILLARY     Status: Normal   Collection Time   04/30/12  8:14 AM      Component Value Range Comment   Glucose-Capillary 86  70 - 99 mg/dL    Dg Chest 2 View  04/30/2012  *RADIOLOGY REPORT*  Clinical Data: Drop in blood sugar  CHEST - 2 VIEW  Comparison: Chest radiograph 04/30/2012  Findings: Stable  enlarged heart silhouette.  There is central venous congestion similar to prior.  No focal consolidation.  There is mild interstitial edema.  There is ill-defined density in the right upper lobe  IMPRESSION:  1.  Cardiomegaly. 2.  Central venous congestion and mild pulmonary edema. 3.  Nodular density in the left upper lobe.  Recommend follow-up radiographs after acute symptomology resolves to determine if this is a true lesion.  Original Report Authenticated By: Suzy Bouchard, M.D.    Review of Systems  Constitutional: Negative for fever.  Respiratory: Positive for shortness of breath.   Cardiovascular: Positive for chest pain.  Gastrointestinal: Positive for nausea. Negative for vomiting and abdominal pain.  Neurological: Positive for weakness and headaches.  Psychiatric/Behavioral: The patient is nervous/anxious.     Blood pressure 167/89, pulse 82, temperature 97.4 F (36.3 C), temperature source Oral, resp. rate 26, height 5\' 8"  (1.727 m), weight 378 lb 12 oz (171.8 kg), SpO2 100.00%. Physical Exam  Constitutional: He is oriented to person, place, and time.       Obese; 378 lbs  Cardiovascular: Normal rate and regular rhythm.   No murmur heard.      Distant   Respiratory: Effort normal. He has wheezes.  GI: Soft. Bowel sounds are normal. He exhibits distension. There is no tenderness.  Musculoskeletal: Normal range of motion.       Weak; slow  Neurological: He is alert and oriented to person, place, and time.  Psychiatric: He has a normal mood and  affect. His behavior is normal. Judgment and thought content normal.     Assessment/Plan 46 yo male with aggressive renal failure Nausea; short of breath Scheduled for Permanent HD cath per Dr Justin Mend Pt aware of procedure benefits and risks and agreeable to proceed Consent signed and in chart  [name] A 04/30/2012, 12:33 PM

## 2012-04-30 NOTE — ED Notes (Signed)
62/m brought to er by ems for c/o sob x 30 min pta. Pt also hypoglycemic, fsbs 64. EMS administered oral glucose pta for hypoglycemia. SPO2 PTA 88%, current SPO2 92% 2L [place].

## 2012-04-30 NOTE — ED Notes (Signed)
Pt given 2 more orange juices

## 2012-04-30 NOTE — Consult Note (Signed)
Referring Provider: No ref. provider found Primary Care Physician:  [facility]phrologist[facility]  Reason for Consultation:  Progressive renal failure  HPI: [name] is a 46 year old man with a PMH significant for Diabetes Mellitus type II, Hypertension, Hyperlipidemia, morbid obesity, and chronic kidney disease with a baseline creatinine of about 3.5-4 who presents to the [facility] ED with complaints of about 1 week of increasing shortness of breath with minimal exertion.   Past Medical History  Diagnosis Date  . Diabetes mellitus   . Hypertension     No past surgical history on file.  Prior to Admission medications   Medication Sig Start Date End Date Taking? Authorizing Provider  amLODipine (NORVASC) 10 MG tablet Take 10 mg by mouth daily.   Yes Historical Provider, MD  aspirin EC 81 MG tablet Take 81 mg by mouth daily.   Yes Historical Provider, MD  CALCIUM CARBONATE PO Take 1 tablet by mouth 3 (three) times daily.   Yes Historical Provider, MD  ferrous sulfate 325 (65 FE) MG tablet Take 325 mg by mouth daily with breakfast.   Yes Historical Provider, MD  insulin aspart (NOVOLOG) 100 UNIT/ML injection Inject 20-30 Units into the skin 3 (three) times daily before meals. Sliding scale based on cbg   Yes Historical Provider, MD  insulin glargine (LANTUS) 100 UNIT/ML injection Inject 60 Units into the skin at bedtime.     Yes Historical Provider, MD  Tamsulosin HCl (FLOMAX) 0.4 MG CAPS Take 0.4 mg by mouth daily.   Yes Historical Provider, MD    Current Facility-Administered Medications  Medication Dose Route Frequency Provider Last Rate Last Dose  . acetaminophen (TYLENOL) tablet 650 mg  650 mg Oral Q6H PRN Trish Fountain, MD       Or  . acetaminophen (TYLENOL) suppository 650 mg  650 mg Rectal Q6H PRN Trish Fountain, MD      . aspirin EC tablet 81 mg  81 mg Oral Daily Trish Fountain, MD      . calcium carbonate (OS-CAL - dosed in mg of elemental calcium)  tablet 500 mg of elemental calcium  500 mg of elemental calcium Oral TID WC Trish Fountain, MD      . docusate sodium (COLACE) capsule 100 mg  100 mg Oral BID Trish Fountain, MD      . furosemide (LASIX) 160 mg in dextrose 5 % 50 mL IVPB  160 mg Intravenous Q6H Trish Fountain, MD      . furosemide (LASIX) injection 40 mg  40 mg Intravenous Once Delora Fuel, MD   40 mg at [id number]  . insulin aspart (novoLOG) injection 0-15 Units  0-15 Units Subcutaneous TID WC Trish Fountain, MD      . insulin glargine (LANTUS) injection 50 Units  50 Units Subcutaneous QHS Trish Fountain, MD      . ondansetron [facility]) tablet 4 mg  4 mg Oral Q6H PRN Trish Fountain, MD       Or  . ondansetron [facility]) injection 4 mg  4 mg Intravenous Q6H PRN Trish Fountain, MD      . sodium chloride 0.9 % injection 3 mL  3 mL Intravenous Q12H Trish Fountain, MD      . sodium chloride 0.9 % injection 3 mL  3 mL Intravenous Q12H Trish Fountain, MD      . sodium chloride 0.9 % injection 3 mL  3 mL Intravenous PRN Trish Fountain, MD      . DISCONTD:  ondansetron (ZOFRAN) injection 4 mg  4 mg Intravenous [id number] PRN Delora Fuel, MD        Allergies as of 04/30/2012  . (No Known Allergies)    Family History  Problem Relation Age of Onset  . Diabetes Mother   . Diabetes Sister   . Diabetes Brother     History   Social History  . Marital Status: Single    Spouse Name: N/A    Number of Children: N/A  . Years of Education: N/A   Occupational History  . Not on file.   Social History Main Topics  . Smoking status: Never Smoker   . Smokeless tobacco: Not on file  . Alcohol Use: No  . Drug Use: Yes    Special: Marijuana  . Sexually Active:    Other Topics Concern  . 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  Divorce with 5 children, 3 boys and 2 girls.  Lives in [place].     Review of Systems: Gen: Fatigue and weakness HEENT: No visual complaints, No history of Retinopathy. Normal external appearance No Epistaxis or Sore throat. No sinusitis.   CV: Dyspnea on minimal exertion Resp dyspnea at rest, dyspnea with exercise,no  cough, sputum, wheezing, coughing up blood, and pleurisy. GI: Denies vomiting blood, jaundice, and fecal incontinence.   Denies dysphagia or odynophagia. GU : Denies urinary burning, blood in urine, urinary frequency, urinary hesitancy, nocturnal urination, and urinary incontinence.  No renal calculi. MS:Not been ambulating for several days Derm: Denies rash, itching, dry skin, hives, moles, warts, or unhealing ulcers.  Psych: Denies depression, anxiety, memory loss, suicidal ideation, hallucinations, paranoia, and confusion. Heme: Denies bruising, bleeding, and enlarged lymph nodes. Neuro: No headache.  No diplopia. No dysarthria.  No dysphasia.  No history of CVA.  No Seizures. No paresthesias.  No weakness. Endocrine Poorly controlled dialbetes  Physical Exam: Vital signs in last 24 hours: Temp:  [97.4 F (36.3 C)-98.4 F (36.9 C)] 97.4 F (36.3 C) (07/22 1156) Pulse Rate:  [78-95] 82  (07/22 1156) Resp:  [16-26] 26  (07/22 1156) BP: (110-167)/(78-104) 167/89 mmHg (07/22 1156) SpO2:  [96 %-100 %] 100 % (07/22 1156) Weight:  [171.8 kg (378 lb 12 oz)] 171.8 kg (378 lb 12 oz) (07/22 1156) Last BM Date: 04/29/12 General:  Morbid obese Head:  Normocephalic and atraumatic. Eyes:  Sclera clear, no icterus.   Conjunctiva pink. Ears:  Normal auditory acuity. Nose:  No deformity, discharge,  or lesions. Mouth:  No deformity or lesions, dentition normal. Neck:  Supple; no masses or thyromegaly. JVP mildly elevated Lungs:  Diminished throughout Heart:  Regular rate and rhythm; no murmurs, clicks, rubs,  or gallops. Abdomen:  Obese but non tender Msk:   Symmetrical without gross deformities. Normal posture. Pulses:  No carotid, renal, femoral bruits. DP and PT symmetrical and equal Extremities:  3+ EDEMA Neurologic:  Alert and  oriented x4;  grossly normal neurologically. Skin:  Intact without significant lesions or rashes. Cervical Nodes:  No significant cervical adenopathy. Psych:  Alert and cooperative. Normal mood and affect.  Intake/Output from previous day:   Intake/Output this shift: Total I/O In: -  Out: 850 [Urine:850]  Lab Results:  [facility] 04/30/12 0516  WBC 9.8  HGB 8.6*  HCT 26.0*  PLT 267   BMET  Basename 04/30/12 0516  NA 138  K 5.4*  CL 99  CO2 23  GLUCOSE 114*  BUN 90*  CREATININE 9.27*  CALCIUM 6.3*  PHOS --   LFT  Basename 04/30/12 0516  PROT 7.3  ALBUMIN 2.9*  AST 29  ALT 31  ALKPHOS 55  BILITOT 0.3  BILIDIR --  IBILI --   PT/INR No results found for this basename: LABPROT:2,INR:2 in the last 72 hours Hepatitis Panel No results found for this basename: HEPBSAG,HCVAB,HEPAIGM,HEPBIGM in the last 72 hours  Studies/Results: Dg Chest 2 View  04/30/2012  *RADIOLOGY REPORT*  Clinical Data: Drop in blood sugar  CHEST - 2 VIEW  Comparison: Chest radiograph 04/30/2012  Findings: Stable enlarged heart silhouette.  There is central venous congestion similar to prior.  No focal consolidation.  There is mild interstitial edema.  There is ill-defined density in the right upper lobe  IMPRESSION:  1.  Cardiomegaly. 2.  Central venous congestion and mild pulmonary edema. 3.  Nodular density in the left upper lobe.  Recommend follow-up radiographs after acute symptomology resolves to determine if this is a true lesion.  Original Report Authenticated By: Suzy Bouchard, M.D.    Assessment/Plan:  ESRD- progressive renal failure with a fairly rapid decline in renal function agree with SPEP and UPEP will aslso check HIV and Hepatitis status  ANEMIA-check iron levels  MBD-check PTH  HTN/VOL-Appears  clinically volume overloaded will need dialysis  ACCESS-Permcath to be placed will have [facility]VS see in AM   LOS: 0 Karysa Heft,Heine W @TODAY @12 :20 PM

## 2012-04-30 NOTE — Progress Notes (Signed)
Utilization review completed.  

## 2012-04-30 NOTE — Progress Notes (Signed)
Initial review for inpatient status is complete. 

## 2012-04-30 NOTE — Progress Notes (Signed)
CBG: 69  Treatment: D50 IV 25 mL  Symptoms: None  Follow-up CBG: Time:1419 CBG Result:85  Possible Reasons for Event: Other: NPO  Comments/MD notified: Monitior    Lotoya Casella, Michael Boston

## 2012-04-30 NOTE — ED Provider Notes (Signed)
History     CSN: [id number]  Arrival date & time 04/30/12  0417   First MD Initiated Contact with Patient 04/30/12 [phone]      Chief Complaint  Patient presents with  . Shortness of Breath    (Consider location/radiation/quality/duration/timing/severity/associated sxs/prior treatment) Patient is a 46 y.o. male presenting with shortness of breath. The history is provided by the patient.  Shortness of Breath  Associated symptoms include shortness of breath.   46 year old male is brought to the ED by ambulance after waking up about one hour ago with dyspnea. He also felt weak. EMS noted CBG 69 and he was given oral glucose. He has had a non-productive cough. No chest pain, fever, chills, sweats, nausea, vomiting. Nothing made him feel better, nothing made him feel worse. He has noted increasing leg swelling over the last week. Past Medical History  Diagnosis Date  . Diabetes mellitus   . Hypertension     No past surgical history on file.  No family history on file.  History  Substance Use Topics  . Smoking status: Never Smoker   . Smokeless tobacco: Not on file  . Alcohol Use: No      Review of Systems  Respiratory: Positive for shortness of breath.   All other systems reviewed and are negative.    Allergies  Review of patient's allergies indicates no known allergies.  Home Medications   Current Outpatient Rx  Name Route Sig Dispense Refill  . AMLODIPINE BESYLATE 5 MG PO TABS Oral Take 5 mg by mouth daily.      . ERGOCALCIFEROL 50000 UNITS PO CAPS Oral Take 50,000 Units by mouth once a week.      . FE FUMARATE-B12-VIT C-FA-IFC PO CAPS Oral Take 1 capsule by mouth daily before breakfast.      . GUAIFENESIN-CODEINE 100-10 MG/5ML PO SYRP  1-2 tsp po q 6 hrs prn cough 120 mL 0  . INSULIN ASPART 100 UNIT/ML [place] SOLN Subcutaneous Inject 20 Units into the skin 3 (three) times daily before meals. Sliding scale based on cbg     . INSULIN GLARGINE 100 UNIT/ML [place] SOLN  Subcutaneous Inject 60 Units into the skin at bedtime.      Marland Kitchen PRAVASTATIN SODIUM 20 MG PO TABS Oral Take 20 mg by mouth daily.      Marland Kitchen VITAMIN D (ERGOCALCIFEROL) 50000 UNITS PO CAPS Oral Take 50,000 Units by mouth daily.       BP 157/91  Pulse 90  Temp 98 F (36.7 C) (Oral)  Resp 20  SpO2 96%  Physical Exam  Nursing note and vitals reviewed. Morbidly obese 46 year old male who appears dyspneic. Vital signs are significant for hypertension with BP 157//91. Respiratory rate is recorded at 20, but was 30 when I examined him. Oxygen saturation is 96% which is normal. Head is normocephalic, atraumatic. PERRLA, EOMI. Neck is nontender and supple, no JVD. Back is nontender. Lungs are clear without rales, wheezes or rhonchi. Heart has RRR, no murmur. Abdomen is obese, soft, nontender without masses or organomegaly. Extremities have full ROM, 3+ pitting edema. Skin is warm and dry without rash. Neurologic: Mental status is normal, CN are intact, there are no motor or sensory deficits.  ED Course  Procedures (including critical care time)  Results for orders placed during the hospital encounter of 04/30/12  GLUCOSE, CAPILLARY      Component Value Range   Glucose-Capillary 47 (*) 70 - 99 mg/dL  CBC WITH DIFFERENTIAL  Component Value Range   WBC 9.8  4.0 - 10.5 K/uL   RBC 3.07 (*) 4.22 - 5.81 MIL/uL   Hemoglobin 8.6 (*) 13.0 - 17.0 g/dL   HCT 26.0 (*) 39.0 - 52.0 %   MCV 84.7  78.0 - 100.0 fL   MCH 28.0  26.0 - 34.0 pg   MCHC 33.1  30.0 - 36.0 g/dL   RDW 14.4  11.5 - 15.5 %   Platelets 267  150 - 400 K/uL   Neutrophils Relative 83 (*) 43 - 77 %   Neutro Abs 8.2 (*) 1.7 - 7.7 K/uL   Lymphocytes Relative 11 (*) 12 - 46 %   Lymphs Abs 1.1  0.7 - 4.0 K/uL   Monocytes Relative 5  3 - 12 %   Monocytes Absolute 0.5  0.1 - 1.0 K/uL   Eosinophils Relative 1  0 - 5 %   Eosinophils Absolute 0.1  0.0 - 0.7 K/uL   Basophils Relative 1  0 - 1 %   Basophils Absolute 0.1  0.0 - 0.1 K/uL    COMPREHENSIVE METABOLIC PANEL      Component Value Range   Sodium 138  135 - 145 mEq/L   Potassium 5.4 (*) 3.5 - 5.1 mEq/L   Chloride 99  96 - 112 mEq/L   CO2 23  19 - 32 mEq/L   Glucose, Bld 114 (*) 70 - 99 mg/dL   BUN 90 (*) 6 - 23 mg/dL   Creatinine, Ser 9.27 (*) 0.50 - 1.35 mg/dL   Calcium 6.3 (*) 8.4 - 10.5 mg/dL   Total Protein 7.3  6.0 - 8.3 g/dL   Albumin 2.9 (*) 3.5 - 5.2 g/dL   AST 29  0 - 37 U/L   ALT 31  0 - 53 U/L   Alkaline Phosphatase 55  39 - 117 U/L   Total Bilirubin 0.3  0.3 - 1.2 mg/dL   GFR calc non Af Amer 6 (*) >90 mL/min   GFR calc Af Amer 7 (*) >90 mL/min  TROPONIN I      Component Value Range   Troponin I <0.30  <0.30 ng/mL  PRO B NATRIURETIC PEPTIDE      Component Value Range   Pro B Natriuretic peptide (BNP) 15902.0 (*) 0 - 125 pg/mL  GLUCOSE, CAPILLARY      Component Value Range   Glucose-Capillary 127 (*) 70 - 99 mg/dL   Dg Chest 2 View  04/30/2012  *RADIOLOGY REPORT*  Clinical Data: Drop in blood sugar  CHEST - 2 VIEW  Comparison: Chest radiograph 04/30/2012  Findings: Stable enlarged heart silhouette.  There is central venous congestion similar to prior.  No focal consolidation.  There is mild interstitial edema.  There is ill-defined density in the right upper lobe  IMPRESSION:  1.  Cardiomegaly. 2.  Central venous congestion and mild pulmonary edema. 3.  Nodular density in the left upper lobe.  Recommend follow-up radiographs after acute symptomology resolves to determine if this is a true lesion.  Original Report Authenticated By: Suzy Bouchard, M.D.      Date: 04/30/2012  Rate: 86  Rhythm: normal sinus rhythm  QRS Axis: normal  Intervals: QT prolonged  ST/T Wave abnormalities: normal  Conduction Disutrbances:none  Narrative Interpretation: Prolonged QT interval, no old ECG available for comparison.  Old EKG Reviewed: none available    1. CHF (congestive heart failure)   2. Acute renal failure   3. Hypoglycemia   4. Hypocalcemia  MDM  Acute dyspnea, most likely CHF exacerbation. Laboratory workup, CXR, and ECG have been ordered.   Hypoglycemia. He is given a meal and glucose will be followed.  Old charts reviewed. He has an urgent care visit for bronchitis.  He was given a dose of Lasix, and has had a moderate diuresis. Labs show renal failure. Elevated BNP is, at least partly, due to renal failure. Hypoglycemia is also related to renal failure, with longer half life of Insulin. Anemia suggests renal failure is chronic. When informed about lab findings, patient says his PCP knows his kidneys are failing and that he may need to go on dialysis one day. Baseline creatinine is 4. He will need hospitalization.  Hypocalcemia is partly due to hypoalbuminemia, but there is true hypocalcemia present.  Case is discussed with Dr. Obie Dredge of [place], who agrees to admit the patient.   Delora Fuel, MD [id number]

## 2012-04-30 NOTE — ED Notes (Signed)
Patient transported to X-ray 

## 2012-04-30 NOTE — Progress Notes (Signed)
CBG: 60  Treatment: 15 GM carbohydrate snack  Symptoms: None  Follow-up CBG: Time:1938 CBG Result:103  Possible Reasons for Event: Other: NPO  Comments/MD notified:Monitor    Aking Klabunde, Michael [place]

## 2012-04-30 NOTE — Progress Notes (Signed)
Patient arrived to room via stretcher from ED.  Placed on monitor.Oriented to unit

## 2012-04-30 NOTE — ED Notes (Signed)
MD at bedside. 

## 2012-04-30 NOTE — ED Notes (Signed)
Pt eating sandwich and drinking juice. 

## 2012-05-01 ENCOUNTER — Encounter (HOSPITAL_COMMUNITY): Payer: Self-pay | Admitting: *Deleted

## 2012-05-01 ENCOUNTER — Inpatient Hospital Stay (HOSPITAL_COMMUNITY): Payer: Non-veteran care

## 2012-05-01 DIAGNOSIS — N186 End stage renal disease: Secondary | ICD-10-CM

## 2012-05-01 LAB — HEPATITIS B SURFACE ANTIBODY,QUALITATIVE

## 2012-05-01 LAB — HEPATITIS B SURFACE ANTIGEN: Hepatitis B Surface Ag: NEGATIVE

## 2012-05-01 LAB — PTH, INTACT AND CALCIUM: PTH: 337.2 pg/mL — ABNORMAL HIGH (ref 14.0–72.0)

## 2012-05-01 LAB — BASIC METABOLIC PANEL
CO2: 28 mEq/L (ref 19–32)
Chloride: 101 mEq/L (ref 96–112)
Creatinine, Ser: 8.69 mg/dL — ABNORMAL HIGH (ref 0.50–1.35)
GFR calc Af Amer: 7 mL/min — ABNORMAL LOW (ref >90)
Potassium: 5.3 mEq/L — ABNORMAL HIGH (ref 3.5–5.1)
Sodium: 143 mEq/L (ref 135–145)

## 2012-05-01 LAB — HIV ANTIBODY (ROUTINE TESTING W REFLEX): HIV: NONREACTIVE

## 2012-05-01 LAB — HEMOGLOBIN A1C: Hgb A1c MFr Bld: 5.8 % — ABNORMAL HIGH (ref <5.7)

## 2012-05-01 LAB — PROTEIN / CREATININE RATIO, URINE
Creatinine, Urine: 36.82 mg/dL
Protein Creatinine Ratio: 6.11 — ABNORMAL HIGH (ref 0.00–0.15)

## 2012-05-01 LAB — GLUCOSE, CAPILLARY
Glucose-Capillary: 133 mg/dL — ABNORMAL HIGH (ref 70–99)
Glucose-Capillary: 109 mg/dL — ABNORMAL HIGH (ref 70–99)

## 2012-05-01 MED ORDER — BIOTENE DRY MOUTH MT LIQD
15.0000 mL | Freq: Two times a day (BID) | OROMUCOSAL | Status: DC
  Administered 2012-05-01 – 2012-05-04 (×5): 15 mL via OROMUCOSAL

## 2012-05-01 MED ORDER — INSULIN ASPART 100 UNIT/ML ~~LOC~~ SOLN
0.0000 [IU] | Freq: Three times a day (TID) | SUBCUTANEOUS | Status: DC
  Administered 2012-05-02: 2 [IU] via SUBCUTANEOUS
  Administered 2012-05-02: 1 [IU] via SUBCUTANEOUS
  Administered 2012-05-02 – 2012-05-03 (×2): 2 [IU] via SUBCUTANEOUS
  Administered 2012-05-04: 3 [IU] via SUBCUTANEOUS
  Administered 2012-05-04: 1 [IU] via SUBCUTANEOUS
  Administered 2012-05-04: 2 [IU] via SUBCUTANEOUS

## 2012-05-01 MED ORDER — SODIUM CHLORIDE 0.9 % IV SOLN
125.0000 mg | INTRAVENOUS | Status: DC
  Administered 2012-05-01 – 2012-05-05 (×3): 125 mg via INTRAVENOUS
  Filled 2012-05-01 (×4): qty 10

## 2012-05-01 NOTE — H&P (Signed)
Internal Medicine Teaching Service Attending Note Date: 05/01/2012  Patient name: Seth [name]  Medical record number: [id number]  Date of birth: 04/14/1966   I have seen and evaluated Seth [name] and discussed their care with the Residency Team. Please see Dr Jill Alexanders H&P for full details. Briefly, Seth [name] was admitted for acute on chronic renal failure. He has been dialyzed and is getting vascular access. He feels better and is being educated on HD.   PMHx, Meds, allergies, soc hx, and fam hx were reviewed  Filed Vitals:   05/01/12 1100 05/01/12 1121 05/01/12 1130 05/01/12 1558  BP: 148/86 156/87 173/89 178/105  Pulse: 71 72 74 82  Temp:   97.8 F (36.6 C) 98 F (36.7 C)  TempSrc:   Oral Oral  Resp: 17 19 17 20   Height:      Weight:   356 lb 7.7 oz (161.7 kg)   SpO2: 100% 100% 100% 97%   GEN : sitting SOB and in NAD Obese HRRR LCTAB Neuro : no focal  Labs and imaging reviewed  Assessment and Plan: I agree with the formulated Assessment and Plan with the following changes:   1. Acute on chronic renal failure - now on HD and symptomatically better. Access is being arranged. Outpt HD being arranged.   2. DM II - A1C was 5.8 and insulin has had to be held 2/2 hypoglycemia. Follow CBG's  3. HTN - meds are being held 2.2 hypotension. Will need to follow since pt is starting HD.   4. Dispo - pt will F/U with Dr Justin Mend and is interested in bc a pt in the [facility] for primary care. We will arrange that prior to D/C.  Bartholomew Crews, MD 7/23/20134:38 PM

## 2012-05-01 NOTE — Progress Notes (Signed)
  Echocardiogram 2D Echocardiogram has been performed.  Seth [name] FRANCES 05/01/2012, 5:58 PM

## 2012-05-01 NOTE — Progress Notes (Signed)
CRITICAL VALUE ALERT  Critical value received:  PTH intact and calcium. Calcium 6.2  Date of notification:  05/01/2012  Time of notification:  1521  Critical value read back:yes  Nurse who received alert:  Gae Gallop RN   MD notified (1st page):  Dr. Juleen China   Time of first page:  1537  MD notified (2nd page):  Resident on call after 5pm  Time of second page:  1700  Responding MD:  Resident on call after 5pm   Time MD responded:  1703

## 2012-05-01 NOTE — Consult Note (Signed)
[facility] [place] CONSULT NOTE 05/01/2012 DOB: [id number] MRN : [id number]  CC:CKD who needs AVF or AVG placement Referring Physician:Dr. Justin Mend  History of Present Illness: Seth [name] is a 46 year old man with a PMH significant for Diabetes Mellitus type II, Hypertension, Hyperlipidemia, morbid obesity, and chronic kidney disease with a baseline creatinine of about 3.5-4 who presents to the [facility] ED with complaints of about 1 week of increasing shortness of breath with minimal exertion. ESRD- progressive renal failure with a fairly rapid decline in renal function agree with SPEP and UPEP will aslso check HIV and Hepatitis status.      Past Medical History  Diagnosis Date  . Diabetes mellitus   . Hypertension     History reviewed. No pertinent past surgical history.   ROS: [x]  Positive  [ ]  Denies    General: [ ]  Weight loss, [ ]  Fever, [ ]  chills Neurologic: [ ]  Dizziness, [ ]  Blackouts, [ ]  Seizure [ ]  Stroke, [ ]  "Mini stroke", [ ]  Slurred speech, [ ]  Temporary blindness; [ ]  weakness in arms or legs, [ ]  Hoarseness Cardiac: [ ]  Chest pain/pressure, [ ]  Shortness of breath at rest [x ] Shortness of breath with exertion, [ ]  Atrial fibrillation or irregular heartbeat Vascular: [ ]  Pain in legs with walking, [ ]  Pain in legs at rest, [ ]  Pain in legs at night,  [ ]  Non-healing ulcer, [ ]  Blood clot in vein/DVT,   Pulmonary: [ ]  Home oxygen, [ ]  Productive cough, [ ]  Coughing up blood, [ ]  Asthma,  [ ]  Wheezing Musculoskeletal:  [ ]  Arthritis, [ ]  Low back pain, [ ]  Joint pain Hematologic: [ ]  Easy Bruising, [ ]  Anemia; [ ]  Hepatitis Gastrointestinal: [ ]  Blood in stool, [ ]  Gastroesophageal Reflux/heartburn, [ ]  Trouble swallowing Urinary: [ ]  chronic Kidney disease, [x ] on HD - [ ]  MWF or [x ] TTHS, [ ]  Burning with urination, [ ]  Difficulty urinating Skin: [ ]  Rashes, [ ]  Wounds Psychological: [ ]  Anxiety, [ ]  Depression  Social History History  Substance  Use Topics  . Smoking status: Never Smoker   . Smokeless tobacco: Not on file  . Alcohol Use: 0.6 oz/week    1 Cans of beer per week    Family History Family History  Problem Relation Age of Onset  . Diabetes Mother   . Diabetes Sister   . Diabetes Brother     No Known Allergies  Current Facility-Administered Medications  Medication Dose Route Frequency Provider Last Rate Last Dose  . acetaminophen (TYLENOL) tablet 650 mg  650 mg Oral Q6H PRN Trish Fountain, MD       Or  . acetaminophen (TYLENOL) suppository 650 mg  650 mg Rectal Q6H PRN Trish Fountain, MD      . antiseptic oral rinse (BIOTENE) solution 15 mL  15 mL Mouth Rinse BID Trish Fountain, MD      . aspirin EC tablet 81 mg  81 mg Oral Daily Trish Fountain, MD   81 mg at 04/30/12 1410  . calcium carbonate (OS-CAL - dosed in mg of elemental calcium) tablet 500 mg of elemental calcium  500 mg of elemental calcium Oral TID WC Trish Fountain, MD      . ceFAZolin (ANCEF) 1-5 GM-% IVPB           . ceFAZolin (ANCEF) IVPB 1 g/50 mL premix  1 g Intravenous Q8H Lavonia Drafts, PA   1  g at 04/30/12 2337  . dextrose 50 % solution 25 mL  25 mL Intravenous Once PRN Bartholomew Crews, MD   25 mL at 04/30/12 1351  . dextrose 50 % solution        25 mL at 04/30/12 1612  . docusate sodium (COLACE) capsule 100 mg  100 mg Oral BID Trish Fountain, MD   100 mg at 04/30/12 2336  . fentaNYL (SUBLIMAZE) 0.05 MG/ML injection           . fentaNYL (SUBLIMAZE) injection   Intravenous PRN Sandi Mariscal, MD   50 mcg at 04/30/12 1709  . furosemide (LASIX) 160 mg in dextrose 5 % 50 mL IVPB  160 mg Intravenous Q6H Trish Fountain, MD   160 mg at 05/01/12 0048  . heparin 1000 UNIT/ML injection           . insulin aspart (novoLOG) injection 0-15 Units  0-15 Units Subcutaneous TID WC Trish Fountain, MD      . midazolam (VERSED) 2 MG/2ML injection           . midazolam (VERSED) 5 MG/5ML injection   Intravenous PRN Sandi Mariscal, MD   1 mg at 04/30/12 1709  . midazolam (VERSED) 5 MG/5ML injection   Intravenous PRN Sandi Mariscal, MD   1 mg at 04/30/12 1710  . ondansetron (ZOFRAN) tablet 4 mg  4 mg Oral Q6H PRN Trish Fountain, MD       Or  . ondansetron [facility]) injection 4 mg  4 mg Intravenous Q6H PRN Trish Fountain, MD      . sodium chloride 0.9 % injection 3 mL  3 mL Intravenous Q12H Trish Fountain, MD   3 mL at 04/30/12 2357  . sodium chloride 0.9 % injection 3 mL  3 mL Intravenous Q12H Trish Fountain, MD      . sodium chloride 0.9 % injection 3 mL  3 mL Intravenous PRN Trish Fountain, MD      . DISCONTD: insulin glargine (LANTUS) injection 50 Units  50 Units Subcutaneous QHS Trish Fountain, MD      . DISCONTD: ondansetron [facility]) injection 4 mg  4 mg Intravenous [id number] PRN Delora Fuel, MD         Imaging: Dg Chest 2 View  04/30/2012  *RADIOLOGY REPORT*  Clinical Data: Drop in blood sugar  CHEST - 2 VIEW  Comparison: Chest radiograph 04/30/2012  Findings: Stable enlarged heart silhouette.  There is central venous congestion similar to prior.  No focal consolidation.  There is mild interstitial edema.  There is ill-defined density in the right upper lobe  IMPRESSION:  1.  Cardiomegaly. 2.  Central venous congestion and mild pulmonary edema. 3.  Nodular density in the left upper lobe.  Recommend follow-up radiographs after acute symptomology resolves to determine if this is a true lesion.  Original Report Authenticated By: Suzy Bouchard, M.D.   US Renal  04/30/2012  *RADIOLOGY REPORT*  Clinical Data: Renal failure  RENAL/URINARY TRACT ULTRASOUND COMPLETE  Comparison:  None.  Findings:  Right Kidney:  12.0 cm.  Negative for obstruction.  Suboptimal anatomical visualization due to patient size.  Left Kidney:  11.7 cm in length.  Negative for obstruction.  Left kidney is poorly visualized due to body habitus.  Bladder:  Negative  IMPRESSION: Limited evaluation of the kidneys.  Negative for  hydronephrosis.  Original Report Authenticated By: Truett Perna, M.D.   Ir Fluoro Guide Cv Line Right  04/30/2012  *RADIOLOGY REPORT*  Indication: Acute renal  insufficiency, in need of access for hemodialysis.  TUNNELED CENTRAL VENOUS HEMODIALYSIS CATHETER PLACEMENT WITH ULTRASOUND AND FLUOROSCOPIC GUIDANCE  Medications: Versed 4 mg IV; Fentanyl 100 mcg IV; Ancef 1 gm IV; The IV antibiotic was given in an appropriate time interval prior to skin puncture.  Contrast: None  Total Moderate Sedation Time: 20 minutes.  Fluoroscopy Time: 1 minutes.  Complications:  None immediate  Findings / Procedure:  Informed written consent was obtained from the patient after a discussion of the risks, benefits, and alternatives to treatment. Questions regarding the procedure were encouraged and answered. The right neck and chest were prepped with chlorhexidine in a sterile fashion, and a sterile drape was applied covering the operative field.  Maximum barrier sterile technique with sterile gowns and gloves were used for the procedure.  A timeout was performed prior to the initiation of the procedure.  After creating a small venotomy incision, a micropuncture kit was utilized to access the right internal jugular vein under direct, real-time ultrasound guidance after the overlying soft tissues were anesthetized with 1% lidocaine with epinephrine.  Ultrasound image documentation was performed.  The microwire was kinked to measure appropriate catheter length.  A stiff glidewire was advanced to the level of the IVC and the micropuncture sheath was exchanged for a peel-away sheath.  A HemoSplit tunneled hemodialysis catheter measuring 23 cm from tip to cuff was tunneled in a retrograde fashion from the anterior chest wall to the venotomy incision.  The catheter was then placed through the peel-away sheath with tips ultimately positioned within the superior aspect of the right atrium.  Final catheter positioning was confirmed and  documented with a spot radiographic image.  The catheter aspirates and flushes normally.  The catheter was flushed with appropriate volume heparin dwells.  The catheter exit site was secured with a 0-Prolene retention suture.  The venotomy incision was closed with an interrupted 4-0 Vicryl, Dermabond and Steri-strips.  Dressings were applied.  The patient tolerated the procedure well without immediate post procedural complication.  IMPRESSION:  Successful placement of 23 cm tip to cuff tunneled hemodialysis catheter via the right internal jugular vein with tips terminating within the superior aspect of the right atrium.  The catheter is ready for immediate use.  Original Report Authenticated By: Rachel Moulds, M.D.   Ir US Guide Vasc Access Right  04/30/2012  *RADIOLOGY REPORT*  Indication: Acute renal insufficiency, in need of access for hemodialysis.  TUNNELED CENTRAL VENOUS HEMODIALYSIS CATHETER PLACEMENT WITH ULTRASOUND AND FLUOROSCOPIC GUIDANCE  Medications: Versed 4 mg IV; Fentanyl 100 mcg IV; Ancef 1 gm IV; The IV antibiotic was given in an appropriate time interval prior to skin puncture.  Contrast: None  Total Moderate Sedation Time: 20 minutes.  Fluoroscopy Time: 1 minutes.  Complications:  None immediate  Findings / Procedure:  Informed written consent was obtained from the patient after a discussion of the risks, benefits, and alternatives to treatment. Questions regarding the procedure were encouraged and answered. The right neck and chest were prepped with chlorhexidine in a sterile fashion, and a sterile drape was applied covering the operative field.  Maximum barrier sterile technique with sterile gowns and gloves were used for the procedure.  A timeout was performed prior to the initiation of the procedure.  After creating a small venotomy incision, a micropuncture kit was utilized to access the right internal jugular vein under direct, real-time ultrasound guidance after the overlying soft  tissues were anesthetized with 1% lidocaine with epinephrine.  Ultrasound  image documentation was performed.  The microwire was kinked to measure appropriate catheter length.  A stiff glidewire was advanced to the level of the IVC and the micropuncture sheath was exchanged for a peel-away sheath.  A HemoSplit tunneled hemodialysis catheter measuring 23 cm from tip to cuff was tunneled in a retrograde fashion from the anterior chest wall to the venotomy incision.  The catheter was then placed through the peel-away sheath with tips ultimately positioned within the superior aspect of the right atrium.  Final catheter positioning was confirmed and documented with a spot radiographic image.  The catheter aspirates and flushes normally.  The catheter was flushed with appropriate volume heparin dwells.  The catheter exit site was secured with a 0-Prolene retention suture.  The venotomy incision was closed with an interrupted 4-0 Vicryl, Dermabond and Steri-strips.  Dressings were applied.  The patient tolerated the procedure well without immediate post procedural complication.  IMPRESSION:  Successful placement of 23 cm tip to cuff tunneled hemodialysis catheter via the right internal jugular vein with tips terminating within the superior aspect of the right atrium.  The catheter is ready for immediate use.  Original Report Authenticated By: Rachel Moulds, M.D.    Significant Diagnostic Studies: CBC Lab Results  Component Value Date   WBC 9.8 04/30/2012   HGB 8.6* 04/30/2012   HCT 26.0* 04/30/2012   MCV 84.7 04/30/2012   PLT 267 04/30/2012    BMET    Component Value Date/Time   NA 143 05/01/2012 0528   K 5.3* 05/01/2012 0528   CL 101 05/01/2012 0528   CO2 28 05/01/2012 0528   GLUCOSE 90 05/01/2012 0528   BUN 80* 05/01/2012 0528   CREATININE 8.69* 05/01/2012 0528   CALCIUM 6.8* 05/01/2012 0528   GFRNONAA 6* 05/01/2012 0528   GFRAA 7* 05/01/2012 0528    COAG Lab Results  Component Value Date   INR 1.11  04/30/2012   No results found for this basename: PTT     Physical Examination BP Readings from Last 3 Encounters:  05/01/12 145/81  01/12/12 196/71  09/05/11 174/91   Temp Readings from Last 3 Encounters:  05/01/12 97.1 F (36.2 C) Oral  01/12/12 98.2 F (36.8 C) Oral  09/05/11 98.8 F (37.1 C) Oral   SpO2 Readings from Last 3 Encounters:  05/01/12 99%  01/12/12 96%  09/05/11 95%   Pulse Readings from Last 3 Encounters:  05/01/12 79  01/12/12 90  09/05/11 80    General:  WDWN in NAD HENT: WNL Eyes: Pupils equal Pulmonary: normal non-labored breathing , without Rales, rhonchi,  wheezing Cardiac: RRR Abdomen: soft, NT, no masses Skin: no rashes, ulcers noted Vascular Exam/Pulses: bilateral upper extremity radial and ulnar pulses intact and equal Left hand dominant Extremities without ischemic changes, no Gangrene , no cellulitis; no open wounds;  Musculoskeletal: no muscle wasting or atrophy  Neurologic: A&O X 3; Appropriate Affect ;  SENSATION: normal; MOTOR FUNCTION: Pt has good and equal strength in all extremities - 5/5 Speech is fluent/normal  Non-Invasive Vascular Imaging: Pending vein mapping of bilateral upper extremities.  ASSESSMENT/PLAN: Accessment based on up coming vein mapping for AVF or AVG based on Dr. Nicole Cella opinion. COLLINS, EMMA MAUREEN PA-C   Agree with above. Has an IV in his right antecubital vein. He is left handed.  Vein map is pending.  Has [employer]  Judeth Cornfield. Scot Dock, [place], [place] [phone] 05/01/2012

## 2012-05-01 NOTE — Progress Notes (Signed)
Subjective:    Interval Events:  Seth [name] says he is feeling good this morning.  He underwent dialysis again this morning, and says he feels much better, especially after dialysis.  He denies chest pain, dyspnea, dizziness, headaches, and abdominal pain.  I spoke with nephrologist.  Patient will need fistula for dialysis.  He gets his healthcare from the [place], and he will need placement for outpatient dialysis before discharge.    Objective:    Vital Signs:   Temp:  [97 F (36.1 C)-98 F (36.7 C)] 97.8 F (36.6 C) (07/23 1130) Pulse Rate:  [71-91] 74  (07/23 1130) Resp:  [12-25] 17  (07/23 1130) BP: (145-204)/(77-139) 173/89 mmHg (07/23 1130) SpO2:  [89 %-100 %] 100 % (07/23 1130) Weight:  [356 lb 7.7 oz (161.7 kg)-378 lb 15.5 oz (171.9 kg)] 356 lb 7.7 oz (161.7 kg) (07/23 1130) Last BM Date: 04/30/12   24-hour weight change: Weight change:  -3.7kg  Filed Weights   05/01/12 0553 05/01/12 0830 05/01/12 1130  Weight: 364 lb 10.3 oz (165.4 kg) 361 lb 8.9 oz (164 kg) 356 lb 7.7 oz (161.7 kg)    Intake/Output:   Intake/Output Summary (Last 24 hours) at 05/01/12 1638 Last data filed at 05/01/12 1322  Gross per 24 hour  Intake    363 ml  Output   8574 ml  Net  -8211 ml      Physical Exam: General appearance: alert, cooperative and no distress Resp: rales bibasilar Cardio: regular rate and rhythm, S1, S2 normal, no murmur, click, rub or gallop GI: soft, non-tender; bowel sounds normal; no masses,  no organomegaly    Labs: Basic Metabolic Panel:  Lab [id number] 0528 04/30/12 1233 04/30/12 0516  NA 143 -- 138  K 5.3* -- 5.4*  CL 101 -- 99  CO2 28 -- 23  GLUCOSE 90 -- 114*  BUN 80* -- 90*  CREATININE 8.69* -- 9.27*  CALCIUM 6.8* 6.2* 6.3*  MG -- 1.9 --  PHOS -- 7.9* --   CBG:  Lab 05/01/12 0550 04/30/12 2201 04/30/12 1938 04/30/12 1845 04/30/12 1641  GLUCAP 86 75 103* 60* 70     Medications:     Scheduled Medications:    . antiseptic oral rinse  15  mL Mouth Rinse BID  . aspirin EC  81 mg Oral Daily  . calcium carbonate  500 mg of elemental calcium Oral TID WC  . ceFAZolin      .  ceFAZolin (ANCEF) IV  1 g Intravenous Q8H  . dextrose      . docusate sodium  100 mg Oral BID  . fentaNYL      . ferric gluconate (FERRLECIT/NULECIT) IV  125 mg Intravenous Q T,Th,Sa-HD  . furosemide  160 mg Intravenous Q6H  . heparin      . insulin aspart  0-15 Units Subcutaneous TID WC  . midazolam      . sodium chloride  3 mL Intravenous Q12H  . sodium chloride  3 mL Intravenous Q12H  . DISCONTD: insulin glargine  50 Units Subcutaneous QHS     PRN Medications: acetaminophen, acetaminophen, dextrose, fentaNYL, midazolam, midazolam, ondansetron (ZOFRAN) IV, ondansetron, sodium chloride   Assessment/ Plan:    1.   Chronic kidney disease, Stage V:  Seth [name] is a 46 year old man who presents with increasing shortness of breath and increased lower extremity swelling. Creatinine today is 9.27 and BUN is 90. His last known baseline Cr was ~ 3.70 and BUN of ~  43.  UPC ratio was significantly elevated at 6.11.  Dr. Justin Mend of nephrology was consulted, and an IJ dialysis catheter was placed and the patient underwent HD.  A second round of HD occurred this morning.  He will need a fistula made for long-term dialysis as an outpatient and placement for same.  HBV and HIV were negative.  Renal ultrasound, B12, folate, and TSH were normal. - SPEP and UPEP pending - Continue Lasix 160mg  IV q6 - Vascular surgery will likely place AV fistula - Daily RFP  2.   Metabolic bone disease:  Secondary to #1.  Phosphorus is high at 7.9, correct calcium is low at 7.2, and PTH is very high at 337. - Continue OS-CAL - Check Vit-D tomorrow morning  3.   Anemia:  Baseline hemoglobin was ~12 in November, 2012.  Now it is 8.6 with an MCV of 85. Iron low at 29, TIBC normal at 283, and %Sat low at 10.  Reticulocyte % is 1.8 (55.4 absolute), indicating poor hematopoetic response to  this anemia.  There is likely a component of iron deficiency as well as anemia secondary to CKD. - IV ferric gluconate with HD - CBC tomorrow  4.   Diabetes Mellitus type II:  He is insulin controlled diabetes with no last known A1C. On arrival to the ED his blood sugar was low. We have stopped his Lantus and are covering him with SSI.  His CBG was 133 at lunchtime and required 2 units of aspart.  It was 109 this evening. - Continue SSI - Continue aspirin  5. Hypertension: Blood pressure on arrival to the ED had SBP in the 140s but with administration of Lasix he had a drop in his SBP to 110. We are holding his amlodipine and tamsulosin, but he was hypertensive today up to 178/105.  6.   Prophylaxis: - SCDs - docusate  7.   Disposition:  Pending fistula placement and outpatient HD coordination.  ANCEF IRON   Length of Stay: 1 days   Signed by:  Shann Medal. Juleen China, MD PGY-I, Internal Medicine Pager [phone]  05/01/2012, 12:31 PM

## 2012-05-01 NOTE — Progress Notes (Signed)
I cosign for Seth [name]'s assessment, med administration, notes, I/O, and care plan education. 

## 2012-05-01 NOTE — Progress Notes (Signed)
[facility] ROUNDING NOTE   Subjective:   Interval History:much better today  Objective:  Vital signs in last 24 hours:  Temp:  [97 F (36.1 C)-98 F (36.7 C)] 97.1 F (36.2 C) (07/23 0830) Pulse Rate:  [77-91] 77  (07/23 0905) Resp:  [12-26] 18  (07/23 0905) BP: (142-204)/(77-139) 152/83 mmHg (07/23 0905) SpO2:  [89 %-100 %] 99 % (07/23 0905) Weight:  [164 kg (361 lb 8.9 oz)-171.9 kg (378 lb 15.5 oz)] 164 kg (361 lb 8.9 oz) (07/23 0830)  Weight change:  Filed Weights   04/30/12 2125 05/01/12 0553 05/01/12 0830  Weight: 168.2 kg (370 lb 13 oz) 165.4 kg (364 lb 10.3 oz) 164 kg (361 lb 8.9 oz)    Intake/Output: I/O last 3 completed shifts: In: 3 [I.V.:3] Out: 6136 [Urine:3125; Other:3011]   Intake/Output this shift:  Total I/O In: 120 [P.O.:120] Out: 425 [Urine:425]  CVS- RRR RS- CTA ABD- BS present soft non-distended EXT- no edema   Basic Metabolic Panel:  Lab [id number] 0528 04/30/12 1233 04/30/12 0516  NA 143 -- 138  K 5.3* -- 5.4*  CL 101 -- 99  CO2 28 -- 23  GLUCOSE 90 -- 114*  BUN 80* -- 90*  CREATININE 8.69* -- 9.27*  CALCIUM 6.8* -- 6.3*  MG -- 1.9 --  PHOS -- 7.9* --    Liver Function Tests:  Lab 04/30/12 1924 04/30/12 0516  AST -- 29  ALT 25 31  ALKPHOS -- 55  BILITOT -- 0.3  PROT -- 7.3  ALBUMIN -- 2.9*   No results found for this basename: LIPASE:5,AMYLASE:5 in the last 168 hours No results found for this basename: AMMONIA:3 in the last 168 hours  CBC:  Lab 04/30/12 0516  WBC 9.8  NEUTROABS 8.2*  HGB 8.6*  HCT 26.0*  MCV 84.7  PLT 267    Cardiac Enzymes:  Lab 04/30/12 0516  CKTOTAL --  CKMB --  CKMBINDEX --  TROPONINI <0.30    BNP: No components found with this basename: POCBNP:5  CBG:  Lab 05/01/12 0550 04/30/12 2201 04/30/12 1938 04/30/12 1845 04/30/12 1641  GLUCAP 86 75 103* 60* 70    Microbiology: No results found for this or any previous visit.  Coagulation Studies:  Basename 04/30/12  1233  LABPROT 14.5  INR 1.11    Urinalysis:  Basename 04/30/12 2310  COLORURINE YELLOW  LABSPEC 1.008  PHURINE 6.5  GLUCOSEU NEGATIVE  HGBUR MODERATE*  BILIRUBINUR NEGATIVE  KETONESUR NEGATIVE  PROTEINUR >300*  UROBILINOGEN 0.2  NITRITE NEGATIVE  LEUKOCYTESUR TRACE*      Imaging: Dg Chest 2 View  04/30/2012  *RADIOLOGY REPORT*  Clinical Data: Drop in blood sugar  CHEST - 2 VIEW  Comparison: Chest radiograph 04/30/2012  Findings: Stable enlarged heart silhouette.  There is central venous congestion similar to prior.  No focal consolidation.  There is mild interstitial edema.  There is ill-defined density in the right upper lobe  IMPRESSION:  1.  Cardiomegaly. 2.  Central venous congestion and mild pulmonary edema. 3.  Nodular density in the left upper lobe.  Recommend follow-up radiographs after acute symptomology resolves to determine if this is a true lesion.  Original Report Authenticated By: Suzy Bouchard, M.D.   US Renal  04/30/2012  *RADIOLOGY REPORT*  Clinical Data: Renal failure  RENAL/URINARY TRACT ULTRASOUND COMPLETE  Comparison:  None.  Findings:  Right Kidney:  12.0 cm.  Negative for obstruction.  Suboptimal anatomical visualization due to patient size.  Left Kidney:  11.7  cm in length.  Negative for obstruction.  Left kidney is poorly visualized due to body habitus.  Bladder:  Negative  IMPRESSION: Limited evaluation of the kidneys.  Negative for hydronephrosis.  Original Report Authenticated By: Truett Perna, M.D.   Ir Fluoro Guide Cv Line Right  04/30/2012  *RADIOLOGY REPORT*  Indication: Acute renal insufficiency, in need of access for hemodialysis.  TUNNELED CENTRAL VENOUS HEMODIALYSIS CATHETER PLACEMENT WITH ULTRASOUND AND FLUOROSCOPIC GUIDANCE  Medications: Versed 4 mg IV; Fentanyl 100 mcg IV; Ancef 1 gm IV; The IV antibiotic was given in an appropriate time interval prior to skin puncture.  Contrast: None  Total Moderate Sedation Time: 20 minutes.  Fluoroscopy  Time: 1 minutes.  Complications:  None immediate  Findings / Procedure:  Informed written consent was obtained from the patient after a discussion of the risks, benefits, and alternatives to treatment. Questions regarding the procedure were encouraged and answered. The right neck and chest were prepped with chlorhexidine in a sterile fashion, and a sterile drape was applied covering the operative field.  Maximum barrier sterile technique with sterile gowns and gloves were used for the procedure.  A timeout was performed prior to the initiation of the procedure.  After creating a small venotomy incision, a micropuncture kit was utilized to access the right internal jugular vein under direct, real-time ultrasound guidance after the overlying soft tissues were anesthetized with 1% lidocaine with epinephrine.  Ultrasound image documentation was performed.  The microwire was kinked to measure appropriate catheter length.  A stiff glidewire was advanced to the level of the IVC and the micropuncture sheath was exchanged for a peel-away sheath.  A HemoSplit tunneled hemodialysis catheter measuring 23 cm from tip to cuff was tunneled in a retrograde fashion from the anterior chest wall to the venotomy incision.  The catheter was then placed through the peel-away sheath with tips ultimately positioned within the superior aspect of the right atrium.  Final catheter positioning was confirmed and documented with a spot radiographic image.  The catheter aspirates and flushes normally.  The catheter was flushed with appropriate volume heparin dwells.  The catheter exit site was secured with a 0-Prolene retention suture.  The venotomy incision was closed with an interrupted 4-0 Vicryl, Dermabond and Steri-strips.  Dressings were applied.  The patient tolerated the procedure well without immediate post procedural complication.  IMPRESSION:  Successful placement of 23 cm tip to cuff tunneled hemodialysis catheter via the right  internal jugular vein with tips terminating within the superior aspect of the right atrium.  The catheter is ready for immediate use.  Original Report Authenticated By: Rachel Moulds, M.D.   Ir US Guide Vasc Access Right  04/30/2012  *RADIOLOGY REPORT*  Indication: Acute renal insufficiency, in need of access for hemodialysis.  TUNNELED CENTRAL VENOUS HEMODIALYSIS CATHETER PLACEMENT WITH ULTRASOUND AND FLUOROSCOPIC GUIDANCE  Medications: Versed 4 mg IV; Fentanyl 100 mcg IV; Ancef 1 gm IV; The IV antibiotic was given in an appropriate time interval prior to skin puncture.  Contrast: None  Total Moderate Sedation Time: 20 minutes.  Fluoroscopy Time: 1 minutes.  Complications:  None immediate  Findings / Procedure:  Informed written consent was obtained from the patient after a discussion of the risks, benefits, and alternatives to treatment. Questions regarding the procedure were encouraged and answered. The right neck and chest were prepped with chlorhexidine in a sterile fashion, and a sterile drape was applied covering the operative field.  Maximum barrier sterile technique with sterile  gowns and gloves were used for the procedure.  A timeout was performed prior to the initiation of the procedure.  After creating a small venotomy incision, a micropuncture kit was utilized to access the right internal jugular vein under direct, real-time ultrasound guidance after the overlying soft tissues were anesthetized with 1% lidocaine with epinephrine.  Ultrasound image documentation was performed.  The microwire was kinked to measure appropriate catheter length.  A stiff glidewire was advanced to the level of the IVC and the micropuncture sheath was exchanged for a peel-away sheath.  A HemoSplit tunneled hemodialysis catheter measuring 23 cm from tip to cuff was tunneled in a retrograde fashion from the anterior chest wall to the venotomy incision.  The catheter was then placed through the peel-away sheath with tips  ultimately positioned within the superior aspect of the right atrium.  Final catheter positioning was confirmed and documented with a spot radiographic image.  The catheter aspirates and flushes normally.  The catheter was flushed with appropriate volume heparin dwells.  The catheter exit site was secured with a 0-Prolene retention suture.  The venotomy incision was closed with an interrupted 4-0 Vicryl, Dermabond and Steri-strips.  Dressings were applied.  The patient tolerated the procedure well without immediate post procedural complication.  IMPRESSION:  Successful placement of 23 cm tip to cuff tunneled hemodialysis catheter via the right internal jugular vein with tips terminating within the superior aspect of the right atrium.  The catheter is ready for immediate use.  Original Report Authenticated By: Rachel Moulds, M.D.     Medications:        . antiseptic oral rinse  15 mL Mouth Rinse BID  . aspirin EC  81 mg Oral Daily  . calcium carbonate  500 mg of elemental calcium Oral TID WC  . ceFAZolin      .  ceFAZolin (ANCEF) IV  1 g Intravenous Q8H  . dextrose      . docusate sodium  100 mg Oral BID  . fentaNYL      . furosemide  160 mg Intravenous Q6H  . heparin      . insulin aspart  0-15 Units Subcutaneous TID WC  . midazolam      . sodium chloride  3 mL Intravenous Q12H  . sodium chloride  3 mL Intravenous Q12H  . DISCONTD: insulin glargine  50 Units Subcutaneous QHS   acetaminophen, acetaminophen, dextrose, fentaNYL, midazolam, midazolam, ondansetron (ZOFRAN) IV, ondansetron, sodium chloride, DISCONTD: ondansetron (ZOFRAN) IV  Assessment/ Plan:  Mr. Winckler is a 46 year old man with a PMH significant for Diabetes Mellitus type II, Hypertension, Hyperlipidemia, morbid obesity, and chronic kidney disease with a baseline creatinine of about 3.5-4 who presents to the [facility] ED with complaints of about 1 week of increasing shortness of breath with minimal exertion    ESRD-  progressive renal failure with a fairly rapid decline in renal function agree with SPEP and UPEP will aslso check HIV negative. Hep B negative  ANEMIA-check iron levels, iron deficient replete MBD-check PTH  HTN/VOL-improved  ACCESS-Permcath to be placed will have VVS see, discussed with Dr Early   LOS: 1 Seth [name],Seth [name] @TODAY @10 :04 AM

## 2012-05-02 DIAGNOSIS — Z01810 Encounter for preprocedural cardiovascular examination: Secondary | ICD-10-CM

## 2012-05-02 LAB — PROTEIN ELECTROPHORESIS, SERUM
Albumin ELP: 45 % — ABNORMAL LOW (ref 55.8–66.1)
Alpha-1-Globulin: 7 % — ABNORMAL HIGH (ref 2.9–4.9)
Alpha-2-Globulin: 16.9 % — ABNORMAL HIGH (ref 7.1–11.8)
Beta 2: 6 % (ref 3.2–6.5)
Beta Globulin: 5.6 % (ref 4.7–7.2)
Gamma Globulin: 19.5 % — ABNORMAL HIGH (ref 11.1–18.8)
M-Spike, %: NOT DETECTED g/dL
Total Protein ELP: 6.8 g/dL (ref 6.0–8.3)

## 2012-05-02 LAB — GLUCOSE, CAPILLARY

## 2012-05-02 LAB — CBC
HCT: 28.7 % — ABNORMAL LOW (ref 39.0–52.0)
Hemoglobin: 9.1 g/dL — ABNORMAL LOW (ref 13.0–17.0)
MCH: 27.2 pg (ref 26.0–34.0)
MCHC: 31.7 g/dL (ref 30.0–36.0)

## 2012-05-02 LAB — RENAL FUNCTION PANEL
Albumin: 3 g/dL — ABNORMAL LOW (ref 3.5–5.2)
Calcium: 7.1 mg/dL — ABNORMAL LOW (ref 8.4–10.5)
Creatinine, Ser: 7.84 mg/dL — ABNORMAL HIGH (ref 0.50–1.35)
GFR calc Af Amer: 8 mL/min — ABNORMAL LOW (ref >90)
GFR calc non Af Amer: 7 mL/min — ABNORMAL LOW (ref >90)
Phosphorus: 7.3 mg/dL — ABNORMAL HIGH (ref 2.3–4.6)
Sodium: 142 mEq/L (ref 135–145)

## 2012-05-02 MED ORDER — ALTEPLASE 2 MG IJ SOLR
2.0000 mg | Freq: Once | INTRAMUSCULAR | Status: DC | PRN
  Filled 2012-05-02: qty 2

## 2012-05-02 MED ORDER — LIDOCAINE-PRILOCAINE 2.5-2.5 % EX CREA
1.0000 "application " | TOPICAL_CREAM | CUTANEOUS | Status: DC | PRN
  Filled 2012-05-02: qty 5

## 2012-05-02 MED ORDER — SODIUM CHLORIDE 0.9 % IV SOLN: 100.0000 mL | INTRAVENOUS | Status: DC | PRN

## 2012-05-02 MED ORDER — FENTANYL CITRATE 0.05 MG/ML IJ SOLN: 50.0000 ug | INTRAMUSCULAR | Status: DC | PRN

## 2012-05-02 MED ORDER — HEPARIN SODIUM (PORCINE) 1000 UNIT/ML DIALYSIS
1000.0000 [IU] | INTRAMUSCULAR | Status: DC | PRN
  Filled 2012-05-02: qty 1

## 2012-05-02 MED ORDER — DEXTROSE 5 % IV SOLN
1.5000 g | INTRAVENOUS | Status: AC
  Administered 2012-05-03: 1.5 g via INTRAVENOUS
  Filled 2012-05-02: qty 1.5

## 2012-05-02 MED ORDER — PENTAFLUOROPROP-TETRAFLUOROETH EX AERO: 1.0000 "application " | INHALATION_SPRAY | CUTANEOUS | Status: DC | PRN

## 2012-05-02 MED ORDER — LIDOCAINE HCL (PF) 1 % IJ SOLN: 5.0000 mL | INTRAMUSCULAR | Status: DC | PRN

## 2012-05-02 MED ORDER — AMLODIPINE BESYLATE 10 MG PO TABS
10.0000 mg | ORAL_TABLET | Freq: Every day | ORAL | Status: DC
  Administered 2012-05-02 – 2012-05-05 (×4): 10 mg via ORAL
  Filled 2012-05-02 (×5): qty 1

## 2012-05-02 MED ORDER — MIDAZOLAM HCL 2 MG/2ML IJ SOLN: 1.0000 mg | INTRAMUSCULAR | Status: DC | PRN

## 2012-05-02 MED ORDER — NEPRO/CARBSTEADY PO LIQD
237.0000 mL | ORAL | Status: DC | PRN
  Filled 2012-05-02: qty 237

## 2012-05-02 MED ORDER — SODIUM CHLORIDE 0.9 % IJ SOLN
3.0000 mL | INTRAMUSCULAR | Status: DC | PRN
  Administered 2012-05-02: 3 mL via INTRAVENOUS

## 2012-05-02 NOTE — Progress Notes (Signed)
Subjective:    Interval Events:  Patient feels great this morning.  He is without complaint and specifically denies dyspnea, chest pain, abdominal pain, headache, and dizziness.  He was dialyzed yesterday.      Objective:    Vital Signs:   Temp:  [97.3 F (36.3 C)-98 F (36.7 C)] 97.7 F (36.5 C) (07/24 0458) Pulse Rate:  [71-91] 81  (07/24 0653) Resp:  [16-22] 20  (07/24 0458) BP: (145-178)/(77-105) 151/91 mmHg (07/24 0653) SpO2:  [95 %-100 %] 96 % (07/24 0458) Weight:  [353 lb 6.4 oz (160.3 kg)-356 lb 7.7 oz (161.7 kg)] 353 lb 6.4 oz (160.3 kg) (07/24 0458) Last BM Date: 05/01/12   24-hour weight change: Weight change: -17 lb 3.1 oz (-7.8 kg)  Filed Weights   05/01/12 0830 05/01/12 1130 05/02/12 0458  Weight: 361 lb 8.9 oz (164 kg) 356 lb 7.7 oz (161.7 kg) 353 lb 6.4 oz (160.3 kg)    Intake/Output:   Intake/Output Summary (Last 24 hours) at 05/02/12 0845 Last data filed at 05/02/12 0820  Gross per 24 hour  Intake   1175 ml  Output   6963 ml  Net  -5788 ml     Net since admission:  -12.2L  Physical Exam: General appearance: alert, cooperative and no distress Resp: clear to auscultation bilaterally Cardio: regular rate and rhythm, S1, S2 normal, no murmur, click, rub or gallop GI: soft, non-tender; bowel sounds normal; no masses,  no organomegaly Extremities: edema 2+    Labs: Basic Metabolic Panel:  Lab [id number] 0625 05/01/12 0528 04/30/12 1233  NA 142 143 --  K 4.9 5.3* --  CL 99 101 --  CO2 28 28 --  GLUCOSE 118* 90 --  BUN 67* 80* --  CREATININE 7.84* 8.69* --  CALCIUM 7.1* 6.8* 6.2*  MG -- -- 1.9  PHOS 7.3* -- 7.9*   Liver Function Tests:  Lab 05/02/12 0625 04/30/12 1924 04/30/12 0516  AST -- -- 29  ALT -- 25 31  ALKPHOS -- -- 55  BILITOT -- -- 0.3  PROT -- -- 7.3  ALBUMIN 3.0* -- 2.9*   CBC:  Lab 05/02/12 0625 04/30/12 0516  WBC 8.7 9.8  NEUTROABS -- 8.2*  HGB 9.1* 8.6*  HCT 28.7* 26.0*  MCV 85.9 84.7  PLT 281 267    CBG:  Lab 05/02/12 0607 05/01/12 2054 05/01/12 1641 05/01/12 1231 05/01/12 0550  GLUCAP 122* 183* 109* 133* 86     Medications:    Scheduled Medications:    . antiseptic oral rinse  15 mL Mouth Rinse BID  . aspirin EC  81 mg Oral Daily  . calcium carbonate  500 mg of elemental calcium Oral TID WC  .  ceFAZolin (ANCEF) IV  1 g Intravenous Q8H  . docusate sodium  100 mg Oral BID  . ferric gluconate (FERRLECIT/NULECIT) IV  125 mg Intravenous Q T,Th,Sa-HD  . furosemide  160 mg Intravenous Q6H  . insulin aspart  0-9 Units Subcutaneous TID WC  . sodium chloride  3 mL Intravenous Q12H  . DISCONTD: insulin aspart  0-15 Units Subcutaneous TID WC  . DISCONTD: sodium chloride  3 mL Intravenous Q12H    PRN Medications: acetaminophen, acetaminophen, midazolam, ondansetron (ZOFRAN) IV, ondansetron, sodium chloride, DISCONTD: sodium chloride   Assessment/ Plan:    1.   Chronic kidney disease, Stage V:  Mr. Nivar is a 46 year old man who presents with increasing shortness of breath and increased lower extremity swelling. Creatinine today is  7.8 and BUN is 67. His last known baseline Cr was ~ 3.70 and BUN of ~ 43. UPC ratio was significantly elevated at 6.11. Dr. Justin Mend of nephrology was consulted, and an IJ dialysis catheter was placed and HD was initiated. He will need a fistula made for long-term dialysis as an outpatient and placement for same. HBV and HIV were negative. Renal ultrasound, B12, folate, and TSH were normal.  - SPEP and UPEP are pending  - Continue Lasix 160mg  IV q6  - Vascular mapping pending - Dr. Scot Dock to make AVF tomorrow around 10:00 - Daily RFP   2.   Metabolic bone disease:  Secondary to #1. Phosphorus is high at 7.9, correct calcium is low at 7.2, and PTH is very high at 337.  - Continue OS-CAL  - Vitamin D pending   3.   Anemia:  Baseline hemoglobin was ~12 in November, 2012. Now it is 8.6 with an MCV of 85. Iron low at 29, TIBC normal at 283, and %Sat low at  10. Reticulocyte % is 1.8 (55.4 absolute), indicating poor hematopoetic response to this anemia. There is likely a component of iron deficiency as well as anemia secondary to CKD.  Hgb is up to 9.1 this morning. - IV ferric gluconate with HD   4.   Diabetes Mellitus type II:  He is insulin controlled diabetes with no last known A1C. On arrival to the ED his blood sugar was low. We have stopped his Lantus and are covering him with SSI.  CBG this AM was ~120 and he received 1 unit of aspart. - Continue SSI  - Continue aspirin   5.   Hypertension:  Blood pressure on arrival to the ED had SBP in the 140s but with administration of Lasix he had a drop in his SBP to 110. We are holding his amlodipine and tamsulosin, but he is now hypertensive. - Consider restarting amlodipine and tamsulosin  6.   Prophylaxis:  - SCDs  - docusate  7.   Disposition:  Pending fistula placement and outpatient HD coordination.   Length of Stay: 2 days   Signed by:  Shann Medal. Juleen China, MD PGY-I, Internal Medicine Pager [phone]  05/02/2012, 8:45 AM

## 2012-05-02 NOTE — Plan of Care (Signed)
Problem: Phase III Progression Outcomes Goal: Voiding independently Outcome: Completed/Met Date Met:  05/02/12 Pt voids independently, no c/o pain or discomfort with urination, urine clear/yellow, goal met 05/02/2012 Montez Hageman, RN

## 2012-05-02 NOTE — Plan of Care (Signed)
Problem: Phase III Progression Outcomes Goal: Foley discontinued Outcome: Not Applicable Date Met:  [id number] Pt does not have foley, goal NA 05/02/2012 Montez Hageman, RN

## 2012-05-02 NOTE — Plan of Care (Signed)
Problem: Food- and Nutrition-Related Knowledge Deficit (NB-1.1) Goal: Nutrition education Formal process to instruct or train a patient/client in a skill or to impart knowledge to help patients/clients voluntarily manage or modify food choices and eating behavior to maintain or improve health.  Outcome: Completed/Met Date Met:  05/02/12 Nutrition Education Note  RD picked up pt for Renal Education through progression rounds, pt is new to HD and has not had diet education previously for a renal diet. Provided Choose-A-Meal Booklet to patient. Reviewed food groups and provided written recommended serving sizes specifically determined for patient's current nutritional status.   Explained why diet restrictions are needed and provided lists of foods to limit/avoid that are high potassium, sodium, and phosphorus. Provided specific recommendations on safer alternatives of these foods. Strongly encouraged compliance of this diet.   Discussed importance of protein intake at each meal and snack. Provided examples of how to maximize protein intake throughout the day. Discussed need for fluid restriction with dialysis, importance of minimizing weight gain between HD treatments, and renal-friendly beverage options.  Encouraged pt to discuss specific diet questions/concerns with RD at HD outpatient facility.   Pt with limited participation in education, did not read booklet provided. Closed eyes several times during education. Did not ask any questions, but did say "your taking away all of my food" several times. Pt seemed a little overwhelmed with needed dietary changed, encouraged pt to continue to talk with RD at HD center to help with diet changes. Encouraged pt to have slow weight loss. Pt denied any needs for DM education. States his BS is usually between 120-180 at home.  Lab Results  Component Value Date    HGBA1C 5.8* 04/30/2012     Expect fair compliance.  Body mass index is 53.73 kg/(m^2). Pt  meets criteria for morbid obesity based on current BMI.  Current diet order is Renal 60/70 2-2-2 1200, patient is consuming approximately 100% of meals at this time. Labs and medications reviewed. No further nutrition interventions warranted at this time. RD contact information provided. If additional nutrition issues arise, please re-consult RD.  Orson Slick RD, LDN Pager [phone] After Hours pager [phone]

## 2012-05-02 NOTE — Progress Notes (Signed)
[facility] ROUNDING NOTE   Subjective:   Interval History: none.  Objective:  Vital signs in last 24 hours:  Temp:  [97.3 F (36.3 C)-98 F (36.7 C)] 97.7 F (36.5 C) (07/24 0458) Pulse Rate:  [71-91] 81  (07/24 0653) Resp:  [17-22] 20  (07/24 0458) BP: (148-178)/(77-105) 151/91 mmHg (07/24 0653) SpO2:  [95 %-100 %] 96 % (07/24 0458) Weight:  [160.3 kg (353 lb 6.4 oz)-161.7 kg (356 lb 7.7 oz)] 160.3 kg (353 lb 6.4 oz) (07/24 0458)  Weight change: -7.8 kg (-17 lb 3.1 oz) Filed Weights   05/01/12 0830 05/01/12 1130 05/02/12 0458  Weight: 164 kg (361 lb 8.9 oz) 161.7 kg (356 lb 7.7 oz) 160.3 kg (353 lb 6.4 oz)    Intake/Output: I/O last 3 completed shifts: In: 1178 [P.O.:940; I.V.:6; IV Piggyback:232] Out: [id number] [Urine:5500; Other:6274]   Intake/Output this shift:  Total I/O In: 123 [P.O.:120; I.V.:3] Out: 500 [Urine:500]  CVS- RRR RS- CTA ABD- BS present soft non-distended EXT- no edema   Basic Metabolic Panel:  Lab [id number] 0625 05/01/12 0528 04/30/12 1233  NA 142 143 --  K 4.9 5.3* --  CL 99 101 --  CO2 28 28 --  GLUCOSE 118* 90 --  BUN 67* 80* --  CREATININE 7.84* 8.69* --  CALCIUM 7.1* 6.8* 6.2*  MG -- -- 1.9  PHOS 7.3* -- 7.9*    Liver Function Tests:  Lab 05/02/12 0625 04/30/12 1924 04/30/12 0516  AST -- -- 29  ALT -- 25 31  ALKPHOS -- -- 55  BILITOT -- -- 0.3  PROT -- -- 7.3  ALBUMIN 3.0* -- 2.9*   No results found for this basename: LIPASE:5,AMYLASE:5 in the last 168 hours No results found for this basename: AMMONIA:3 in the last 168 hours  CBC:  Lab 05/02/12 0625 04/30/12 0516  WBC 8.7 9.8  NEUTROABS -- 8.2*  HGB 9.1* 8.6*  HCT 28.7* 26.0*  MCV 85.9 84.7  PLT 281 267    Cardiac Enzymes:  Lab 04/30/12 0516  CKTOTAL --  CKMB --  CKMBINDEX --  TROPONINI <0.30    BNP: No components found with this basename: POCBNP:5  CBG:  Lab 05/02/12 0607 05/01/12 2054 05/01/12 1641 05/01/12 1231 05/01/12 0550  GLUCAP  122* 183* 109* 133* 86    Microbiology: No results found for this or any previous visit.  Coagulation Studies:  Basename 04/30/12 1233  LABPROT 14.5  INR 1.11    Urinalysis:  Basename 04/30/12 2310  COLORURINE YELLOW  LABSPEC 1.008  PHURINE 6.5  GLUCOSEU NEGATIVE  HGBUR MODERATE*  BILIRUBINUR NEGATIVE  KETONESUR NEGATIVE  PROTEINUR >300*  UROBILINOGEN 0.2  NITRITE NEGATIVE  LEUKOCYTESUR TRACE*      Imaging: X-ray Chest Pa And Lateral   05/01/2012  *RADIOLOGY REPORT*  Clinical Data: Shortness of breath, dialysis patient, diabetes, hypertension  CHEST - 2 VIEW  Comparison: 04/30/2012  Findings: Cardiomegaly noted with vascular and interstitial prominence, improved since yesterday consistent with resolving CHF. No enlarging effusion or pneumothorax.  Right IJ dialysis catheter tips in the SVC RA junction.  Trachea midline.  Degenerative changes of the spine.  IMPRESSION: Improving CHF pattern  Original Report Authenticated By: Jerilynn Mages. Daryll Brod, M.D.   US Renal  04/30/2012  *RADIOLOGY REPORT*  Clinical Data: Renal failure  RENAL/URINARY TRACT ULTRASOUND COMPLETE  Comparison:  None.  Findings:  Right Kidney:  12.0 cm.  Negative for obstruction.  Suboptimal anatomical visualization due to patient size.  Left Kidney:  11.7 cm  in length.  Negative for obstruction.  Left kidney is poorly visualized due to body habitus.  Bladder:  Negative  IMPRESSION: Limited evaluation of the kidneys.  Negative for hydronephrosis.  Original Report Authenticated By: Truett Perna, M.D.   Ir Fluoro Guide Cv Line Right  04/30/2012  *RADIOLOGY REPORT*  Indication: Acute renal insufficiency, in need of access for hemodialysis.  TUNNELED CENTRAL VENOUS HEMODIALYSIS CATHETER PLACEMENT WITH ULTRASOUND AND FLUOROSCOPIC GUIDANCE  Medications: Versed 4 mg IV; Fentanyl 100 mcg IV; Ancef 1 gm IV; The IV antibiotic was given in an appropriate time interval prior to skin puncture.  Contrast: None  Total Moderate  Sedation Time: 20 minutes.  Fluoroscopy Time: 1 minutes.  Complications:  None immediate  Findings / Procedure:  Informed written consent was obtained from the patient after a discussion of the risks, benefits, and alternatives to treatment. Questions regarding the procedure were encouraged and answered. The right neck and chest were prepped with chlorhexidine in a sterile fashion, and a sterile drape was applied covering the operative field.  Maximum barrier sterile technique with sterile gowns and gloves were used for the procedure.  A timeout was performed prior to the initiation of the procedure.  After creating a small venotomy incision, a micropuncture kit was utilized to access the right internal jugular vein under direct, real-time ultrasound guidance after the overlying soft tissues were anesthetized with 1% lidocaine with epinephrine.  Ultrasound image documentation was performed.  The microwire was kinked to measure appropriate catheter length.  A stiff glidewire was advanced to the level of the IVC and the micropuncture sheath was exchanged for a peel-away sheath.  A HemoSplit tunneled hemodialysis catheter measuring 23 cm from tip to cuff was tunneled in a retrograde fashion from the anterior chest wall to the venotomy incision.  The catheter was then placed through the peel-away sheath with tips ultimately positioned within the superior aspect of the right atrium.  Final catheter positioning was confirmed and documented with a spot radiographic image.  The catheter aspirates and flushes normally.  The catheter was flushed with appropriate volume heparin dwells.  The catheter exit site was secured with a 0-Prolene retention suture.  The venotomy incision was closed with an interrupted 4-0 Vicryl, Dermabond and Steri-strips.  Dressings were applied.  The patient tolerated the procedure well without immediate post procedural complication.  IMPRESSION:  Successful placement of 23 cm tip to cuff tunneled  hemodialysis catheter via the right internal jugular vein with tips terminating within the superior aspect of the right atrium.  The catheter is ready for immediate use.  Original Report Authenticated By: Rachel Moulds, M.D.   Ir US Guide Vasc Access Right  04/30/2012  *RADIOLOGY REPORT*  Indication: Acute renal insufficiency, in need of access for hemodialysis.  TUNNELED CENTRAL VENOUS HEMODIALYSIS CATHETER PLACEMENT WITH ULTRASOUND AND FLUOROSCOPIC GUIDANCE  Medications: Versed 4 mg IV; Fentanyl 100 mcg IV; Ancef 1 gm IV; The IV antibiotic was given in an appropriate time interval prior to skin puncture.  Contrast: None  Total Moderate Sedation Time: 20 minutes.  Fluoroscopy Time: 1 minutes.  Complications:  None immediate  Findings / Procedure:  Informed written consent was obtained from the patient after a discussion of the risks, benefits, and alternatives to treatment. Questions regarding the procedure were encouraged and answered. The right neck and chest were prepped with chlorhexidine in a sterile fashion, and a sterile drape was applied covering the operative field.  Maximum barrier sterile technique with sterile gowns  and gloves were used for the procedure.  A timeout was performed prior to the initiation of the procedure.  After creating a small venotomy incision, a micropuncture kit was utilized to access the right internal jugular vein under direct, real-time ultrasound guidance after the overlying soft tissues were anesthetized with 1% lidocaine with epinephrine.  Ultrasound image documentation was performed.  The microwire was kinked to measure appropriate catheter length.  A stiff glidewire was advanced to the level of the IVC and the micropuncture sheath was exchanged for a peel-away sheath.  A HemoSplit tunneled hemodialysis catheter measuring 23 cm from tip to cuff was tunneled in a retrograde fashion from the anterior chest wall to the venotomy incision.  The catheter was then placed  through the peel-away sheath with tips ultimately positioned within the superior aspect of the right atrium.  Final catheter positioning was confirmed and documented with a spot radiographic image.  The catheter aspirates and flushes normally.  The catheter was flushed with appropriate volume heparin dwells.  The catheter exit site was secured with a 0-Prolene retention suture.  The venotomy incision was closed with an interrupted 4-0 Vicryl, Dermabond and Steri-strips.  Dressings were applied.  The patient tolerated the procedure well without immediate post procedural complication.  IMPRESSION:  Successful placement of 23 cm tip to cuff tunneled hemodialysis catheter via the right internal jugular vein with tips terminating within the superior aspect of the right atrium.  The catheter is ready for immediate use.  Original Report Authenticated By: Rachel Moulds, M.D.     Medications:        . amLODipine  10 mg Oral Daily  . antiseptic oral rinse  15 mL Mouth Rinse BID  . aspirin EC  81 mg Oral Daily  . calcium carbonate  500 mg of elemental calcium Oral TID WC  .  ceFAZolin (ANCEF) IV  1 g Intravenous Q8H  . docusate sodium  100 mg Oral BID  . ferric gluconate (FERRLECIT/NULECIT) IV  125 mg Intravenous Q T,Th,Sa-HD  . furosemide  160 mg Intravenous Q6H  . insulin aspart  0-9 Units Subcutaneous TID WC  . sodium chloride  3 mL Intravenous Q12H  . DISCONTD: insulin aspart  0-15 Units Subcutaneous TID WC  . DISCONTD: sodium chloride  3 mL Intravenous Q12H   acetaminophen, acetaminophen, midazolam, ondansetron (ZOFRAN) IV, ondansetron, sodium chloride, DISCONTD: sodium chloride  Assessment/ Plan:  Mr. Muccio is a 46 year old man with a PMH significant for Diabetes Mellitus type II, Hypertension, Hyperlipidemia, morbid obesity, and chronic kidney disease with a baseline creatinine of about 3.5-4 who presents to the [facility] ED with complaints of about 1 week of increasing shortness of breath with  minimal exertion  ESRD- progressive renal failure with a fairly rapid decline in renal function agree with SPEP and UPEP will aslso check HIV negative. Hep B negative  ANEMIA-check iron levels, iron deficient replete  MBD-check PTH  HTN/VOL-improved  ACCESS-Permcath to be placed will have VVS see, discussed with Dr Early  AVF to be placed in AM and dialysis tomorrow     LOS: 2 Seth [name],[place] W @TODAY @10 :35 AM

## 2012-05-02 NOTE — Progress Notes (Signed)
VASCULAR LAB PRELIMINARY  PRELIMINARY  PRELIMINARY  PRELIMINARY  Right  Upper Extremity Vein Map    Cephalic  Segment Diameter Depth Comment  1. Axilla 3.76mm    2. Mid upper arm 3.86mm    3. Above AC 5.26mm    4. In AC 3.65mm    5. Below AC 3.45mm    6. Mid forearm 3.22mm    7. Wrist 2.46mm                    Basilic  Segment Diameter Depth Comment       2. Mid upper arm 5.62mm 36mm Enters Brachial  3. Above AC 4.24mm 6.94mm   4. In AC 4.39mm 4.2mm   5. Below AC 3.20mm 2.34mm   6. Mid forearm 2.20mm 2.35mm   7. Wrist 2.66mm 4.20mm                    Left Upper Extremity Vein Map    Cephalic  Segment Diameter Depth Comment  1. Axilla 3.50mm    2. Mid upper arm 3.33mm    3. Above AC 3.76mm    4. In [facility] 3.34mm    5. Below AC 3.68mm    6. Mid forearm 2.21mm    7. Wrist 2.34mm                    Basilic  Segment Diameter Depth Comment       2. Mid upper arm 5.32mm 31mm Enters Brachial  3. Above [facility] 5.60mm 7.72mm Branch  4. In [facility] 2.38mm 3.58mm Multiple Branching  5. Below AC 3.54mm 2.66mm   6. Mid forearm 2.75mm 2.44mm   7. Wrist 3.4mm 2.39mm                   Bilateral cephalic and basilic veins appear adequate is size, compressible, and thrombus free.   Manly Nestle, RVS 05/02/2012, 1:45 PM

## 2012-05-02 NOTE — Progress Notes (Signed)
VASCULAR PROGRESS NOTE  SUBJECTIVE: No complaints  PHYSICAL EXAM: Filed Vitals:   05/01/12 2056 05/02/12 0030 05/02/12 0458 05/02/12 0653  BP: 167/97 155/77 178/79 151/91  Pulse: 85  91 81  Temp: 97.3 F (36.3 C)  97.7 F (36.5 C)   TempSrc: Oral  Oral   Resp: 20  20   Height:      Weight:   353 lb 6.4 oz (160.3 kg)   SpO2: 95%  96%    No change in exam. Palpable radial pulses.  LABS: Lab Results  Component Value Date   WBC 8.7 05/02/2012   HGB 9.1* 05/02/2012   HCT 28.7* 05/02/2012   MCV 85.9 05/02/2012   PLT 281 05/02/2012   Lab Results  Component Value Date   CREATININE 8.69* 05/01/2012   Lab Results  Component Value Date   INR 1.11 04/30/2012   CBG (last 3)   Basename 05/02/12 0607 05/01/12 2054 05/01/12 1641  GLUCAP 122* 183* 109*     ASSESSMENT/PLAN: 1.Vein map pending 2. Plan left AVF/AVG tomorrow pending the results of the vein map. 3. Please dialyze Friday if possible instead of tomorrow or early AM Thursday. His surgery will be around 10 AM tomorrow.   Deitra Mayo, MD, FACS Beeper: [phone] 05/02/2012

## 2012-05-03 ENCOUNTER — Inpatient Hospital Stay (HOSPITAL_COMMUNITY): Payer: Non-veteran care | Admitting: Anesthesiology

## 2012-05-03 ENCOUNTER — Encounter (HOSPITAL_COMMUNITY): Payer: Self-pay | Admitting: Anesthesiology

## 2012-05-03 ENCOUNTER — Inpatient Hospital Stay (HOSPITAL_COMMUNITY): Payer: Non-veteran care

## 2012-05-03 ENCOUNTER — Encounter (HOSPITAL_COMMUNITY)
Admission: EM | Disposition: A | Discharge status: Home / Self Care | Payer: Self-pay | Source: Home / Self Care | Attending: Internal Medicine

## 2012-05-03 HISTORY — PX: AV FISTULA PLACEMENT: SHX1204

## 2012-05-03 LAB — GLUCOSE, CAPILLARY
Glucose-Capillary: 202 mg/dL — ABNORMAL HIGH (ref 70–99)
Glucose-Capillary: 123 mg/dL — ABNORMAL HIGH (ref 70–99)
Glucose-Capillary: 111 mg/dL — ABNORMAL HIGH (ref 70–99)

## 2012-05-03 LAB — CBC
HCT: 27.9 % — ABNORMAL LOW (ref 39.0–52.0)
Hemoglobin: 9 g/dL — ABNORMAL LOW (ref 13.0–17.0)
MCH: 27.5 pg (ref 26.0–34.0)
Platelets: 280 10*3/uL (ref 150–400)
RBC: 3.27 MIL/uL — ABNORMAL LOW (ref 4.22–5.81)

## 2012-05-03 LAB — UIFE/LIGHT CHAINS/TP QN, 24-HR UR
Albumin, U: DETECTED
Alpha 2, Urine: DETECTED — AB
Beta, Urine: DETECTED — AB
Gamma Globulin, Urine: DETECTED — AB

## 2012-05-03 LAB — RENAL FUNCTION PANEL
CO2: 28 mEq/L (ref 19–32)
Chloride: 95 mEq/L — ABNORMAL LOW (ref 96–112)
Creatinine, Ser: 8.68 mg/dL — ABNORMAL HIGH (ref 0.50–1.35)
GFR calc Af Amer: 7 mL/min — ABNORMAL LOW (ref >90)
GFR calc non Af Amer: 6 mL/min — ABNORMAL LOW (ref >90)
Sodium: 139 mEq/L (ref 135–145)

## 2012-05-03 LAB — OCCULT BLOOD X 1 CARD TO LAB, STOOL: Fecal Occult Bld: NEGATIVE

## 2012-05-03 SURGERY — ARTERIOVENOUS (AV) FISTULA CREATION
Anesthesia: Monitor Anesthesia Care | Site: Arm Lower | Laterality: Right | Wound class: Clean

## 2012-05-03 MED ORDER — HYDROCODONE-ACETAMINOPHEN 10-325 MG PO TABS: 1.0000 | ORAL_TABLET | Freq: Four times a day (QID) | ORAL | Status: DC | PRN

## 2012-05-03 MED ORDER — SODIUM CHLORIDE 0.9 % IV SOLN
INTRAVENOUS | Status: DC | PRN
  Administered 2012-05-03: 10:00:00 via INTRAVENOUS

## 2012-05-03 MED ORDER — VITAMIN D (ERGOCALCIFEROL) 1.25 MG (50000 UNIT) PO CAPS
50000.0000 [IU] | ORAL_CAPSULE | ORAL | Status: DC
  Administered 2012-05-03: 50000 [IU] via ORAL
  Filled 2012-05-03 (×2): qty 1

## 2012-05-03 MED ORDER — LIDOCAINE HCL (CARDIAC) 20 MG/ML IV SOLN
INTRAVENOUS | Status: DC | PRN
  Administered 2012-05-03: 60 mg via INTRAVENOUS

## 2012-05-03 MED ORDER — SODIUM CHLORIDE 0.9 % IR SOLN
Status: DC | PRN
  Administered 2012-05-03: 11:00:00

## 2012-05-03 MED ORDER — 0.9 % SODIUM CHLORIDE (POUR BTL) OPTIME
TOPICAL | Status: DC | PRN
  Administered 2012-05-03: 1000 mL

## 2012-05-03 MED ORDER — LIDOCAINE HCL (PF) 1 % IJ SOLN
INTRAMUSCULAR | Status: AC
  Filled 2012-05-03: qty 30

## 2012-05-03 MED ORDER — MIDAZOLAM HCL 5 MG/5ML IJ SOLN
INTRAMUSCULAR | Status: DC | PRN
  Administered 2012-05-03: 2 mg via INTRAVENOUS

## 2012-05-03 MED ORDER — PROPOFOL 10 MG/ML IV EMUL
INTRAVENOUS | Status: DC | PRN
  Administered 2012-05-03: 30 mg via INTRAVENOUS

## 2012-05-03 MED ORDER — PROPOFOL 10 MG/ML IV EMUL
INTRAVENOUS | Status: DC | PRN
  Administered 2012-05-03: 25 ug/kg/min via INTRAVENOUS

## 2012-05-03 MED ORDER — FENTANYL CITRATE 0.05 MG/ML IJ SOLN
INTRAMUSCULAR | Status: DC | PRN
  Administered 2012-05-03: 100 ug via INTRAVENOUS

## 2012-05-03 MED ORDER — MEPERIDINE HCL 25 MG/ML IJ SOLN: 6.2500 mg | INTRAMUSCULAR | Status: DC | PRN

## 2012-05-03 MED ORDER — TAMSULOSIN HCL 0.4 MG PO CAPS
0.4000 mg | ORAL_CAPSULE | Freq: Every day | ORAL | Status: DC
  Administered 2012-05-03 – 2012-05-05 (×3): 0.4 mg via ORAL
  Filled 2012-05-03 (×3): qty 1

## 2012-05-03 MED ORDER — PROMETHAZINE HCL 25 MG/ML IJ SOLN
6.2500 mg | INTRAMUSCULAR | Status: DC | PRN
  Filled 2012-05-03: qty 1

## 2012-05-03 MED ORDER — LIDOCAINE-EPINEPHRINE (PF) 1 %-1:200000 IJ SOLN
INTRAMUSCULAR | Status: DC | PRN
  Administered 2012-05-03: 30 mL

## 2012-05-03 MED ORDER — FENTANYL CITRATE 0.05 MG/ML IJ SOLN: 25.0000 ug | INTRAMUSCULAR | Status: DC | PRN

## 2012-05-03 MED ORDER — HYDROCODONE-ACETAMINOPHEN 7.5-325 MG PO TABS
1.0000 | ORAL_TABLET | Freq: Four times a day (QID) | ORAL | Status: DC | PRN
  Administered 2012-05-03 – 2012-05-04 (×3): 1 via ORAL
  Filled 2012-05-03 (×3): qty 1

## 2012-05-03 MED ORDER — LIDOCAINE-EPINEPHRINE (PF) 1 %-1:200000 IJ SOLN
INTRAMUSCULAR | Status: AC
  Filled 2012-05-03: qty 10

## 2012-05-03 SURGICAL SUPPLY — 35 items
CANISTER SUCTION 2500CC (MISCELLANEOUS) ×2 IMPLANT
CLIP TI MEDIUM 6 (CLIP) ×2 IMPLANT
CLIP TI WIDE RED SMALL 6 (CLIP) ×2 IMPLANT
CLOTH BEACON ORANGE TIMEOUT ST (SAFETY) ×2 IMPLANT
COVER PROBE W GEL 5X96 (DRAPES) IMPLANT
COVER SURGICAL LIGHT HANDLE (MISCELLANEOUS) ×2 IMPLANT
DERMABOND ADVANCED (GAUZE/BANDAGES/DRESSINGS) ×1
DERMABOND ADVANCED .7 DNX12 (GAUZE/BANDAGES/DRESSINGS) ×1 IMPLANT
DRAIN PENROSE 1/2X12 LTX STRL (WOUND CARE) ×4 IMPLANT
ELECT REM PT RETURN 9FT ADLT (ELECTROSURGICAL) ×2
ELECTRODE REM PT RTRN 9FT ADLT (ELECTROSURGICAL) ×1 IMPLANT
GEL ULTRASOUND 20GR AQUASONIC (MISCELLANEOUS) IMPLANT
GLOVE BIO SURGEON STRL SZ 6.5 (GLOVE) ×2 IMPLANT
GLOVE BIOGEL PI IND STRL 6.5 (GLOVE) ×1 IMPLANT
GLOVE BIOGEL PI IND STRL 7.0 (GLOVE) ×2 IMPLANT
GLOVE BIOGEL PI IND STRL 7.5 (GLOVE) ×1 IMPLANT
GLOVE BIOGEL PI INDICATOR 6.5 (GLOVE) ×1
GLOVE BIOGEL PI INDICATOR 7.0 (GLOVE) ×2
GLOVE BIOGEL PI INDICATOR 7.5 (GLOVE) ×1
GLOVE SS BIOGEL STRL SZ 7 (GLOVE) ×2 IMPLANT
GLOVE SUPERSENSE BIOGEL SZ 7 (GLOVE) ×2
GOWN STRL NON-REIN LRG LVL3 (GOWN DISPOSABLE) ×6 IMPLANT
KIT BASIN OR (CUSTOM PROCEDURE TRAY) ×2 IMPLANT
KIT ROOM TURNOVER OR (KITS) ×2 IMPLANT
NS IRRIG 1000ML POUR BTL (IV SOLUTION) ×2 IMPLANT
PACK CV ACCESS (CUSTOM PROCEDURE TRAY) ×2 IMPLANT
PAD ARMBOARD 7.5X6 YLW CONV (MISCELLANEOUS) ×4 IMPLANT
SPONGE GAUZE 4X4 12PLY (GAUZE/BANDAGES/DRESSINGS) ×2 IMPLANT
SUT PROLENE 6 0 BV (SUTURE) ×2 IMPLANT
SUT VIC AB 3-0 SH 27 (SUTURE) ×1
SUT VIC AB 3-0 SH 27X BRD (SUTURE) ×1 IMPLANT
TOWEL OR 17X24 6PK STRL BLUE (TOWEL DISPOSABLE) ×2 IMPLANT
TOWEL OR 17X26 10 PK STRL BLUE (TOWEL DISPOSABLE) ×2 IMPLANT
UNDERPAD 30X30 INCONTINENT (UNDERPADS AND DIAPERS) ×2 IMPLANT
WATER STERILE IRR 1000ML POUR (IV SOLUTION) ×2 IMPLANT

## 2012-05-03 NOTE — Op Note (Signed)
OPERATIVE REPORT  Date of Surgery: 04/30/2012 - 05/03/2012  Surgeon: Tinnie Gens, MD  Assistant: Gerri Lins pa  Pre-op Diagnosis: End Stage Renal Disease  Post-op Diagnosis: End Stage Renal Disease  Procedure: Procedure(s): ARTERIOVENOUS (AV) FISTULA CREATION-right radial artery to cephalic vein oCimino Anesthesia: General  EBL: Minimal  Complications: None  Procedure Details: Patient to the operating room placed in supine position at which time right upper extremity was prepped with Betadine scrub and solution draped in routine sterile manner. After infiltration with 1% Xylocaine with epinephrine a short longitudinal incision was made proximal to the wrist carried down through subcutaneous tissue the cephalic vein was identified and dissected free. His branches were ligated with 3 and 4-0 silk ties and divided. There was an approximate 3 mm vein. Radial artery was exposed beneath the fascia and it was a 3 mm artery with a good pulse. Was then occluded temporarily proximally and distally with vascular clamps opened longitudinally with a 15 blade extended with Potts scissors. A 3 dilator would pass proximally without difficulty. Vein was carefully measured spatulated and anastomosed end to side 6-0 Prolene. Clamps were then released there was good pulse and thrill in the fistula with good Doppler flow up to the antecubital area. Adequate hemostasis was achieved wound closed in layers with Vicryl in a subcuticular fashion with Dermabond the patient to the recovery room in stable condition  Tinnie Gens, MD 05/03/2012 11:22 AM

## 2012-05-03 NOTE — Preoperative (Signed)
Beta Blockers   Reason not to administer Beta Blockers:Not Applicable. No home beta blockers 

## 2012-05-03 NOTE — Interval H&P Note (Signed)
History and Physical Interval Note:  05/03/2012 9:46 AM  Seth [name]  has presented today for surgery, with the diagnosis of End Stage Renal Disease  The various methods of treatment have been discussed with the patient and family. After consideration of risks, benefits and other options for treatment, the patient has consented to  Procedure(s) (LRB): ARTERIOVENOUS (AV) FISTULA CREATION (Right) as a surgical intervention .  The patient's history has been reviewed, patient examined, no change in status, stable for surgery.  I have reviewed the patient's chart and labs.  Questions were answered to the patient's satisfaction.   Patient is left-handed and requested attempt at fistula in right upper extremity. Vein mapping appears possibly satisfactory for either forearm or upper arm fistula on the right side. Plan attempted AV fistula right upper extremity  LAWSON, JAMES

## 2012-05-03 NOTE — Procedures (Signed)
I have seen and examined this patient and agree with the plan of care seen on dialysis.  Azan Maneri,[place] W 05/03/2012, 9:15 AM

## 2012-05-03 NOTE — H&P (View-Only) (Signed)
VASCULAR PROGRESS NOTE  SUBJECTIVE: No complaints  PHYSICAL EXAM: Filed Vitals:   05/01/12 2056 05/02/12 0030 05/02/12 0458 05/02/12 0653  BP: 167/97 155/77 178/79 151/91  Pulse: 85  91 81  Temp: 97.3 F (36.3 C)  97.7 F (36.5 C)   TempSrc: Oral  Oral   Resp: 20  20   Height:      Weight:   353 lb 6.4 oz (160.3 kg)   SpO2: 95%  96%    No change in exam. Palpable radial pulses.  LABS: Lab Results  Component Value Date   WBC 8.7 05/02/2012   HGB 9.1* 05/02/2012   HCT 28.7* 05/02/2012   MCV 85.9 05/02/2012   PLT 281 05/02/2012   Lab Results  Component Value Date   CREATININE 8.69* 05/01/2012   Lab Results  Component Value Date   INR 1.11 04/30/2012   CBG (last 3)   Basename 05/02/12 0607 05/01/12 2054 05/01/12 1641  GLUCAP 122* 183* 109*     ASSESSMENT/PLAN: 1.Vein map pending 2. Plan left AVF/AVG tomorrow pending the results of the vein map. 3. Please dialyze Friday if possible instead of tomorrow or early AM Thursday. His surgery will be around 10 AM tomorrow.   Deitra Mayo, MD, FACS Beeper: [phone] 05/02/2012

## 2012-05-03 NOTE — Anesthesia Postprocedure Evaluation (Signed)
  Anesthesia Post-op Note  Patient: Seth [name]  Procedure(s) Performed: Procedure(s) (LRB): ARTERIOVENOUS (AV) FISTULA CREATION (Right)  Patient Location: PACU  Anesthesia Type: General  Level of Consciousness: awake  Airway and Oxygen Therapy: Patient Spontanous Breathing  Post-op Pain: mild  Post-op Assessment: Post-op Vital signs reviewed  Post-op Vital Signs: stable  Complications: No apparent anesthesia complications

## 2012-05-03 NOTE — Progress Notes (Addendum)
05/03/2012 12:36 PM Hemodialysis Outpatient note: This patient has been accepted at the [facility] dialysis center on a MWF 2nd shift schedule. The center can begin treatment on Monday 07.29.13 at 10:30 am. Patient's insurance has been approved and verified. Thank you. Gordy Savers 05/03/2012 2:41 PM Hemodialysis addendum: please have patient bring his home meds with him to the dialysis center on his first day of treatment. Thank you. Gordy Savers

## 2012-05-03 NOTE — Anesthesia Preprocedure Evaluation (Addendum)
Anesthesia Evaluation  Patient identified by MRN, date of birth, ID band Patient awake    Reviewed: Allergy & Precautions, H&P , NPO status , Patient's Chart, lab work & pertinent test results, reviewed documented beta blocker date and time   History of Anesthesia Complications Negative for: history of anesthetic complications  Airway Mallampati: II  Neck ROM: Full    Dental  (+) Teeth Intact and Dental Advisory Given   Pulmonary    + decreased breath sounds      Cardiovascular hypertension, Pt. on medications Rhythm:Regular     Neuro/Psych    GI/Hepatic   Endo/Other  Well Controlled, Insulin DependentMorbid obesity  Renal/GU      Musculoskeletal   Abdominal (+) + obese,   Peds  Hematology   Anesthesia Other Findings   Reproductive/Obstetrics                          Anesthesia Physical Anesthesia Plan  ASA: IV  Anesthesia Plan: MAC   Post-op Pain Management:    Induction: Intravenous  Airway Management Planned: Simple Face Mask  Additional Equipment:   Intra-op Plan:   Post-operative Plan:   Informed Consent: I have reviewed the patients History and Physical, chart, labs and discussed the procedure including the risks, benefits and alternatives for the proposed anesthesia with the patient or authorized representative who has indicated his/her understanding and acceptance.   Dental advisory given  Plan Discussed with: CRNA and Surgeon  Anesthesia Plan Comments:         Anesthesia Quick Evaluation

## 2012-05-03 NOTE — Progress Notes (Signed)
Utilization review completed.  

## 2012-05-03 NOTE — OR Nursing (Signed)
Patient's consent originally stated left arm. Upon Dr. Evelena Leyden pre-op assessment, [name] and Dr. Kellie Simmering discussed and concluded that creation of AV fistula would instead be created on the right arm. Consent updated. Patient's questions were answered and with no further questions. Pre-procedural verification and time out performed for right arm as well as the time out in the operating room.

## 2012-05-03 NOTE — Progress Notes (Signed)
[facility] ROUNDING NOTE   Subjective:   Interval History:none  Objective:  Vital signs in last 24 hours:  Temp:  [97.1 F (36.2 C)-97.8 F (36.6 C)] 97.1 F (36.2 C) (07/25 0919) Pulse Rate:  [78-95] 86  (07/25 0919) Resp:  [13-25] 22  (07/25 0919) BP: (133-195)/(62-107) 164/82 mmHg (07/25 0919) SpO2:  [94 %-100 %] 98 % (07/25 0919) Weight:  [155.5 kg (342 lb 13 oz)-159.9 kg (352 lb 8.3 oz)] 155.5 kg (342 lb 13 oz) (07/25 0919)  Weight change: -5.105 kg (-11 lb 4.1 oz) Filed Weights   05/03/12 0449 05/03/12 0530 05/03/12 0919  Weight: 158.895 kg (350 lb 4.8 oz) 159.9 kg (352 lb 8.3 oz) 155.5 kg (342 lb 13 oz)    Intake/Output: I/O last 3 completed shifts: In: 1387 [P.O.:1080; I.V.:9; IV Piggyback:298] Out: 5526 [Urine:5525; Stool:1]   Intake/Output this shift:  Total I/O In: -  Out: 3300 [Other:3300]  CVS- RRR RS- CTA ABD- BS present soft non-distended EXT- no edema   Basic Metabolic Panel:  Lab [id number] 0635 05/02/12 0625 05/01/12 0528 04/30/12 1233 04/30/12 0516  NA 139 142 143 -- 138  K 4.2 4.9 5.3* -- 5.4*  CL 95* 99 101 -- 99  CO2 28 28 28  -- 23  GLUCOSE 129* 118* 90 -- 114*  BUN 74* 67* 80* -- 90*  CREATININE 8.68* 7.84* 8.69* -- 9.27*  CALCIUM 6.9* 7.1* 6.8* -- --  MG -- -- -- 1.9 --  PHOS 7.3* 7.3* -- 7.9* --    Liver Function Tests:  Lab 05/03/12 0635 05/02/12 0625 04/30/12 1924 04/30/12 0516  AST -- -- -- 29  ALT -- -- 25 31  ALKPHOS -- -- -- 55  BILITOT -- -- -- 0.3  PROT -- -- -- 7.3  ALBUMIN 3.0* 3.0* -- 2.9*   No results found for this basename: LIPASE:5,AMYLASE:5 in the last 168 hours No results found for this basename: AMMONIA:3 in the last 168 hours  CBC:  Lab 05/03/12 0635 05/02/12 0625 04/30/12 0516  WBC 8.3 8.7 9.8  NEUTROABS -- -- 8.2*  HGB 9.0* 9.1* 8.6*  HCT 27.9* 28.7* 26.0*  MCV 85.3 85.9 84.7  PLT 280 281 267    Cardiac Enzymes:  Lab 04/30/12 0516  CKTOTAL --  CKMB --  CKMBINDEX --    TROPONINI <0.30    BNP: No components found with this basename: POCBNP:5  CBG:  Lab 05/03/12 0512 05/02/12 2153 05/02/12 1623 05/02/12 1126 05/02/12 0607  GLUCAP 123* 202* 182* 193* 122*    Microbiology: Results for orders placed during the hospital encounter of 04/30/12  SURGICAL PCR SCREEN     Status: Normal   Collection Time   05/03/12  4:56 AM      Component Value Range Status Comment   MRSA, PCR NEGATIVE  NEGATIVE Final    Staphylococcus aureus NEGATIVE  NEGATIVE Final     Coagulation Studies:  Basename 04/30/12 1233  LABPROT 14.5  INR 1.11    Urinalysis:  Basename 04/30/12 2310  COLORURINE YELLOW  LABSPEC 1.008  PHURINE 6.5  GLUCOSEU NEGATIVE  HGBUR MODERATE*  BILIRUBINUR NEGATIVE  KETONESUR NEGATIVE  PROTEINUR >300*  UROBILINOGEN 0.2  NITRITE NEGATIVE  LEUKOCYTESUR TRACE*      Imaging: X-ray Chest Pa And Lateral   05/01/2012  *RADIOLOGY REPORT*  Clinical Data: Shortness of breath, dialysis patient, diabetes, hypertension  CHEST - 2 VIEW  Comparison: 04/30/2012  Findings: Cardiomegaly noted with vascular and interstitial prominence, improved since yesterday consistent with  resolving CHF. No enlarging effusion or pneumothorax.  Right IJ dialysis catheter tips in the SVC RA junction.  Trachea midline.  Degenerative changes of the spine.  IMPRESSION: Improving CHF pattern  Original Report Authenticated By: Jerilynn Mages. Daryll Brod, M.D.     Medications:        . amLODipine  10 mg Oral Daily  . antiseptic oral rinse  15 mL Mouth Rinse BID  . aspirin EC  81 mg Oral Daily  . calcium carbonate  500 mg of elemental calcium Oral TID WC  . cefUROXime (ZINACEF)  IV  1.5 g Intravenous On Call to OR  . docusate sodium  100 mg Oral BID  . ferric gluconate (FERRLECIT/NULECIT) IV  125 mg Intravenous Q T,Th,Sa-HD  . furosemide  160 mg Intravenous Q6H  . insulin aspart  0-9 Units Subcutaneous TID WC  . sodium chloride  3 mL Intravenous Q12H  . Vitamin D (Ergocalciferol)   50,000 Units Oral Q7 days  . DISCONTD:  ceFAZolin (ANCEF) IV  1 g Intravenous Q8H   sodium chloride, sodium chloride, acetaminophen, acetaminophen, alteplase, feeding supplement (NEPRO CARB STEADY), fentaNYL, heparin, lidocaine, lidocaine-prilocaine, midazolam, midazolam, ondansetron (ZOFRAN) IV, ondansetron, pentafluoroprop-tetrafluoroeth, sodium chloride, DISCONTD: sodium chloride, DISCONTD: sodium chloride, DISCONTD: alteplase, DISCONTD: feeding supplement (NEPRO CARB STEADY), DISCONTD: heparin, DISCONTD: lidocaine DISCONTD: lidocaine-prilocaine, DISCONTD: pentafluoroprop-tetrafluoroeth  Assessment/ Plan:  Mr. Pierog is a 46 year old man with a PMH significant for Diabetes Mellitus type II, Hypertension, Hyperlipidemia, morbid obesity, and chronic kidney disease with a baseline creatinine of about 3.5-4 who presents to the [facility] ED with complaints of about 1 week of increasing shortness of breath with minimal exertion   ESRD- progressive renal failure with a fairly rapid decline in renal function agree with SPEP and UPEP will aslso check HIV negative. Hep B negative   ANEMIA-check iron levels, iron deficient replete   MBD-check PTH   HTN/VOL-improved   ACCESS- AVF to be placed today  Seen in dialysis today   LOS: 3 Zakara Parkey,[place] W @TODAY @9 :56 AM

## 2012-05-03 NOTE — Progress Notes (Signed)
Subjective:    Interval Events:  Patient feels great this morning. He is without complaint and specifically denies dyspnea, chest pain, abdominal pain, headache, and dizziness. He was dialyzed this morning and had a radial to cephalic AVF created by Dr. Kellie Simmering.    Objective:    Vital Signs:   Temp:  [97 F (36.1 C)-98.1 F (36.7 C)] 97.7 F (36.5 C) (07/25 1423) Pulse Rate:  [78-90] 90  (07/25 1423) Resp:  [13-25] 20  (07/25 1423) BP: (133-195)/(62-107) 191/98 mmHg (07/25 1423) SpO2:  [93 %-100 %] 97 % (07/25 1423) Weight:  [342 lb 13 oz (155.5 kg)-352 lb 8.3 oz (159.9 kg)] 342 lb 13 oz (155.5 kg) (07/25 0919) Last BM Date: 05/02/12   24-hour weight change: Admission weight:  172kg  Weight change: -11 lb 4.1 oz (-5.105 kg)  Filed Weights   05/03/12 0449 05/03/12 0530 05/03/12 0919  Weight: 350 lb 4.8 oz (158.895 kg) 352 lb 8.3 oz (159.9 kg) 342 lb 13 oz (155.5 kg)    Intake/Output:   Intake/Output Summary (Last 24 hours) at 05/03/12 1425 Last data filed at 05/03/12 1316  Gross per 24 hour  Intake    914 ml  Output   5630 ml  Net  -4716 ml       Physical Exam: General appearance: alert, cooperative and no distress  Resp: clear to auscultation bilaterally  Cardio: regular rate and rhythm, S1, S2 normal, no murmur, click, rub or gallop  GI: soft, non-tender; bowel sounds normal; no masses, no organomegaly  Extremities: incision over right arm from AVF creation; lower extremity edema 2+   Labs: Basic Metabolic Panel:  Lab [id number] 0635 05/02/12 0625 05/01/12 0528 04/30/12 1233 04/30/12 0516  NA 139 142 143 -- 138  K 4.2 4.9 5.3* -- 5.4*  CL 95* 99 101 -- 99  CO2 28 28 28  -- 23  GLUCOSE 129* 118* 90 -- 114*  BUN 74* 67* 80* -- 90*  CREATININE 8.68* 7.84* 8.69* -- 9.27*  CALCIUM 6.9* 7.1* 6.8* -- --  MG -- -- -- 1.9 --  PHOS 7.3* 7.3* -- 7.9* --    Liver Function Tests:  Lab 05/03/12 0635 05/02/12 0625 04/30/12 1924 04/30/12 0516  AST -- -- -- 29    ALT -- -- 25 31  ALKPHOS -- -- -- 55  BILITOT -- -- -- 0.3  PROT -- -- -- 7.3  ALBUMIN 3.0* 3.0* -- 2.9*   CBC:  Lab 05/03/12 0635 05/02/12 0625 04/30/12 0516  WBC 8.3 8.7 9.8  NEUTROABS -- -- 8.2*  HGB 9.0* 9.1* 8.6*  HCT 27.9* 28.7* 26.0*  MCV 85.3 85.9 84.7  PLT 280 281 267   Cardiac Enzymes:  Lab 04/30/12 0516  CKTOTAL --  CKMB --  CKMBINDEX --  TROPONINI <0.30   CBG:  Lab 05/03/12 1134 05/03/12 0512 05/02/12 2153 05/02/12 1623 05/02/12 1126  GLUCAP 111* 123* 202* 182* 193*   Vitamin-D Vit D, 25-Hydroxy 13    Medications:    Scheduled Medications:    . amLODipine  10 mg Oral Daily  . antiseptic oral rinse  15 mL Mouth Rinse BID  . aspirin EC  81 mg Oral Daily  . calcium carbonate  500 mg of elemental calcium Oral TID WC  . cefUROXime (ZINACEF)  IV  1.5 g Intravenous On Call to OR  . docusate sodium  100 mg Oral BID  . ferric gluconate (FERRLECIT/NULECIT) IV  125 mg Intravenous Q T,Th,Sa-HD  . furosemide  160 mg Intravenous Q6H  . insulin aspart  0-9 Units Subcutaneous TID WC  . sodium chloride  3 mL Intravenous Q12H  . Tamsulosin HCl  0.4 mg Oral Daily  . Vitamin D (Ergocalciferol)  50,000 Units Oral Q7 days     PRN Medications: acetaminophen, acetaminophen, ondansetron (ZOFRAN) IV, ondansetron, sodium chloride, DISCONTD: sodium chloride, DISCONTD: sodium chloride, DISCONTD: 0.9 % irrigation (POUR BTL), DISCONTD: alteplase, DISCONTD: feeding supplement (NEPRO CARB STEADY), DISCONTD: fentaNYL, DISCONTD: fentaNYL, DISCONTD: heparin 6000 unit irrigation, DISCONTD: heparin, DISCONTD: lidocaine, DISCONTD: lidocaine-EPINEPHrine, DISCONTD: lidocaine-prilocaine DISCONTD: meperidine (DEMEROL) injection, DISCONTD: midazolam, DISCONTD: midazolam, DISCONTD: pentafluoroprop-tetrafluoroeth, DISCONTD: promethazine   Assessment/ Plan:    1.  Chronic kidney disease, Stage V:  Seth [name] is a 46 year old man who presents with increasing shortness of breath and  increased lower extremity swelling. Creatinine at admission was 9.3. His last known baseline Cr was ~ 3.70. UPC ratio was significantly elevated at 6.11. Dr. Justin Mend of nephrology was consulted, and an IJ dialysis catheter was placed and HD was initiated. A radial-cephalic AVF was created by Dr. Kellie Simmering of [facility] on July 25, and outpatient dialysis was arranged at [facility] for M/W/F to begin July 29.  HBV and HIV were negative.  Renal ultrasound, B12, folate, and TSH were normal.  SPEP and UPEP are still pending. - SPEP and UPEP are pending  - Continue Lasix 160mg  IV q6  - Daily RFP   2.   Metabolic bone disease:  Secondary to #1. Phosphorus is high at 7.9, correct calcium is low at 7.2, and PTH is very high at 337.  Vitamin D was low at 13.  - Continue OS-CAL, 1 tablet, qAC - Vitamin D 50,000U weekly for six weeks  3.   Anemia:  Baseline hemoglobin was ~12 in November, 2012. Now it is 8.6 with an MCV of 85. Iron low at 29, TIBC normal at 283, and %Sat low at 10. Reticulocyte % is 1.8 (55.4 absolute), indicating poor hematopoetic response to this anemia. There is likely a component of iron deficiency as well as anemia secondary to CKD. Hgb is down to 9.0 this morning.  FOBT is negative x1. - IV ferric gluconate 125mg  3 times weekly with HD - FOBT x2  4.   Diabetes Mellitus type II:  He is insulin controlled diabetes with no last known A1C. On arrival to the ED his blood sugar was low. We have stopped his Lantus and are covering him with SSI. - Continue SSI  - Continue aspirin   5.   Hypertension:  Blood pressure on arrival to the ED had SBP in the 140s but with administration of Lasix he had a drop in his SBP to 110.  Since then, he has become hypertensive.  We have restarted his amlodipine 10mg  and tamsulosin 0.4mg .  6.   Prophylaxis:  - SCDs  - docusate  7.   Disposition:   Possible discharge tomorrow after HD.    Length of Stay: 3 days   Signed by:  Shann Medal. Juleen China,  MD PGY-I, Internal Medicine Pager [phone]  05/03/2012, 2:25 PM

## 2012-05-03 NOTE — Transfer of Care (Signed)
Immediate Anesthesia Transfer of Care Note  Patient: Seth [name]  Procedure(s) Performed: Procedure(s) (LRB): ARTERIOVENOUS (AV) FISTULA CREATION (Right)  Patient Location: PACU  Anesthesia Type: MAC  Level of Consciousness: awake, alert  and oriented  Airway & Oxygen Therapy: Patient Spontanous Breathing and Patient connected to nasal cannula oxygen  Post-op Assessment: Report given to PACU RN and Post -op Vital signs reviewed and stable  Post vital signs: Reviewed and stable  Complications: No apparent anesthesia complications

## 2012-05-04 ENCOUNTER — Encounter (HOSPITAL_COMMUNITY): Payer: Self-pay | Admitting: Vascular Surgery

## 2012-05-04 LAB — RENAL FUNCTION PANEL
BUN: 48 mg/dL — ABNORMAL HIGH (ref 6–23)
CO2: 29 mEq/L (ref 19–32)
Calcium: 7.6 mg/dL — ABNORMAL LOW (ref 8.4–10.5)
Creatinine, Ser: 7.01 mg/dL — ABNORMAL HIGH (ref 0.50–1.35)
Glucose, Bld: 163 mg/dL — ABNORMAL HIGH (ref 70–99)
Phosphorus: 6.7 mg/dL — ABNORMAL HIGH (ref 2.3–4.6)

## 2012-05-04 LAB — GLUCOSE, CAPILLARY: Glucose-Capillary: 136 mg/dL — ABNORMAL HIGH (ref 70–99)

## 2012-05-04 MED ORDER — CALCIUM ACETATE 667 MG PO CAPS
667.0000 mg | ORAL_CAPSULE | Freq: Three times a day (TID) | ORAL | Status: DC
  Administered 2012-05-04 – 2012-05-05 (×4): 667 mg via ORAL
  Filled 2012-05-04 (×6): qty 1

## 2012-05-04 MED ORDER — HEPARIN SODIUM (PORCINE) 1000 UNIT/ML DIALYSIS
20.0000 [IU]/kg | INTRAMUSCULAR | Status: DC | PRN
  Administered 2012-05-05: 3100 [IU] via INTRAVENOUS_CENTRAL
  Filled 2012-05-04: qty 4

## 2012-05-04 NOTE — Progress Notes (Signed)
Pt in [place] this morning Page MD to inform of change from NSR to [place]. Order 12 EKG showed NSR

## 2012-05-04 NOTE — Progress Notes (Signed)
[facility] ROUNDING NOTE   Subjective:   Interval History: improved  Objective:  Vital signs in last 24 hours:  Temp:  [97 F (36.1 C)-98.1 F (36.7 C)] 97.3 F (36.3 C) (07/26 0616) Pulse Rate:  [80-94] 89  (07/26 0616) Resp:  [17-22] 20  (07/26 0616) BP: (118-191)/(67-98) 146/67 mmHg (07/26 0616) SpO2:  [93 %-100 %] 98 % (07/26 0616) Weight:  [155.5 kg (342 lb 13 oz)-156.2 kg (344 lb 5.7 oz)] 156.2 kg (344 lb 5.7 oz) (07/26 0616)  Weight change: -3.395 kg (-7 lb 7.8 oz) Filed Weights   05/03/12 0530 05/03/12 0919 05/04/12 0616  Weight: 159.9 kg (352 lb 8.3 oz) 155.5 kg (342 lb 13 oz) 156.2 kg (344 lb 5.7 oz)    Intake/Output: I/O last 3 completed shifts: In: [id number] [P.O.:1320; I.V.:131; IV Piggyback:198] Out: [id number] [Urine:2550; Other:3300; Blood:5]   Intake/Output this shift:  Total I/O In: 360 [P.O.:360] Out: 400 [Urine:400]  CVS- RRR RS- CTA ABD- BS present soft non-distended EXT- no edema   Basic Metabolic Panel:  Lab 0000000 0630 05/03/12 0635 05/02/12 0625 05/01/12 0528 04/30/12 1233 04/30/12 0516  NA 138 139 142 143 -- 138  K 4.7 4.2 4.9 5.3* -- 5.4*  CL 95* 95* 99 101 -- 99  CO2 29 28 28 28  -- 23  GLUCOSE 163* 129* 118* 90 -- 114*  BUN 48* 74* 67* 80* -- 90*  CREATININE 7.01* 8.68* 7.84* 8.69* -- 9.27*  CALCIUM 7.6* 6.9* 7.1* -- -- --  MG -- -- -- -- 1.9 --  PHOS 6.7* 7.3* 7.3* -- 7.9* --    Liver Function Tests:  Lab 05/04/12 0630 05/03/12 0635 05/02/12 0625 04/30/12 1924 04/30/12 0516  AST -- -- -- -- 29  ALT -- -- -- 25 31  ALKPHOS -- -- -- -- 55  BILITOT -- -- -- -- 0.3  PROT -- -- -- -- 7.3  ALBUMIN 3.1* 3.0* 3.0* -- 2.9*   No results found for this basename: LIPASE:5,AMYLASE:5 in the last 168 hours No results found for this basename: AMMONIA:3 in the last 168 hours  CBC:  Lab 05/03/12 0635 05/02/12 0625 04/30/12 0516  WBC 8.3 8.7 9.8  NEUTROABS -- -- 8.2*  HGB 9.0* 9.1* 8.6*  HCT 27.9* 28.7* 26.0*  MCV 85.3 85.9  84.7  PLT 280 281 267    Cardiac Enzymes:  Lab 04/30/12 0516  CKTOTAL --  CKMB --  CKMBINDEX --  TROPONINI <0.30    BNP: No components found with this basename: POCBNP:5  CBG:  Lab 05/04/12 0621 05/03/12 2134 05/03/12 1632 05/03/12 1134 05/03/12 0512  GLUCAP 136* 193* 197* 111* 123*    Microbiology: Results for orders placed during the hospital encounter of 04/30/12  SURGICAL PCR SCREEN     Status: Normal   Collection Time   05/03/12  4:56 AM      Component Value Range Status Comment   MRSA, PCR NEGATIVE  NEGATIVE Final    Staphylococcus aureus NEGATIVE  NEGATIVE Final     Coagulation Studies: No results found for this basename: LABPROT:5,INR:5 in the last 72 hours  Urinalysis: No results found for this basename: COLORURINE:2,APPERANCEUR:2,LABSPEC:2,PHURINE:2,GLUCOSEU:2,HGBUR:2,BILIRUBINUR:2,KETONESUR:2,PROTEINUR:2,UROBILINOGEN:2,NITRITE:2,LEUKOCYTESUR:2 in the last 72 hours    Imaging: No results found.   Medications:        . amLODipine  10 mg Oral Daily  . antiseptic oral rinse  15 mL Mouth Rinse BID  . aspirin EC  81 mg Oral Daily  . calcium carbonate  500 mg of elemental  calcium Oral TID WC  . cefUROXime (ZINACEF)  IV  1.5 g Intravenous On Call to OR  . docusate sodium  100 mg Oral BID  . ferric gluconate (FERRLECIT/NULECIT) IV  125 mg Intravenous Q T,Th,Sa-HD  . insulin aspart  0-9 Units Subcutaneous TID WC  . sodium chloride  3 mL Intravenous Q12H  . Tamsulosin HCl  0.4 mg Oral Daily  . Vitamin D (Ergocalciferol)  50,000 Units Oral Q7 days  . DISCONTD: furosemide  160 mg Intravenous Q6H   acetaminophen, acetaminophen, heparin, HYDROcodone-acetaminophen, ondansetron (ZOFRAN) IV, ondansetron, sodium chloride, DISCONTD: sodium chloride, DISCONTD: sodium chloride, DISCONTD: 0.9 % irrigation (POUR BTL), DISCONTD: alteplase, DISCONTD: feeding supplement (NEPRO CARB STEADY), DISCONTD: fentaNYL, DISCONTD: fentaNYL, DISCONTD: heparin 6000 unit irrigation,  DISCONTD: heparin, DISCONTD: HYDROcodone-acetaminophen, DISCONTD: lidocaine DISCONTD: lidocaine-EPINEPHrine, DISCONTD: lidocaine-prilocaine, DISCONTD: meperidine (DEMEROL) injection, DISCONTD: midazolam, DISCONTD: midazolam, DISCONTD: pentafluoroprop-tetrafluoroeth, DISCONTD: promethazine  Assessment/ Plan:  [name] is a 46 year old man with a PMH significant for Diabetes Mellitus type II, Hypertension, Hyperlipidemia, morbid obesity, and chronic kidney disease with a baseline creatinine of about 3.5-4 who presents to the [facility] ED with complaints of about 1 week of increasing shortness of breath with minimal exertion  ESRD- progressive renal failure with a fairly rapid decline in renal function agree with SPEP and UPEP will aslso check HIV negative. Hep B negative  ANEMIA- iron deficient replete  MBD- PTH  300   Phos increased start binders HTN/VOL-improved  ACCESS-Permcath and AVF  AVF to be placed in AM and dialysis tomorrow      LOS: 4 Terrina Docter,[place] W @TODAY @8 :44 AM

## 2012-05-04 NOTE — Progress Notes (Signed)
Consult sent to wrong dept for f/up education with Int Medicine clinic.  Entered the order to the correct dept.

## 2012-05-04 NOTE — Progress Notes (Signed)
Met with patient and son. Patient able to verbalize main concepts of previous education on renal diet by inpatient dietitian. He resides with his mother who assists with food shopping and preparation and they have already been discussing changes they will need to make. He agrees to follow up as outpatient for reinforcement. Encouraged patient to self monitor blood sugars frequently during transition from inpatient to outpatient and call office if blood sugars too low or too high. Have scheduled patient with CDE on same day as hospital follow up: 05/17/12 at 10:30 AM.

## 2012-05-04 NOTE — Progress Notes (Signed)
  VASCULAR AND VEIN SURGERY PROGRESS NOTE  POST-OP HEMODIALYSIS ACCESS  Date of Surgery: 04/30/2012 - 05/03/2012 Surgeon: Juliann Mule): Mal [name], MD 1 Day Post-Op Right  ARTERIOVENOUS (AV) FISTULA CREATION   HPI: Seth [name] is a 46 y.o. male who is 1 Day Post-Op creation/revision of right upper extremity Hemodialysis access. The patient denies symptoms of numbness, tingling, weakness; denies pain in the operative limb.   Significant Diagnostic Studies: CBC Lab Results  Component Value Date   WBC 8.3 05/03/2012   HGB 9.0* 05/03/2012   HCT 27.9* 05/03/2012   MCV 85.3 05/03/2012   PLT 280 05/03/2012    BMET    Component Value Date/Time   NA 138 05/04/2012 0630   K 4.7 05/04/2012 0630   CL 95* 05/04/2012 0630   CO2 29 05/04/2012 0630   GLUCOSE 163* 05/04/2012 0630   BUN 48* 05/04/2012 0630   CREATININE 7.01* 05/04/2012 0630   CALCIUM 7.6* 05/04/2012 0630   CALCIUM 6.2* 04/30/2012 1233   GFRNONAA 8* 05/04/2012 0630   GFRAA 10* 05/04/2012 0630    COAG Lab Results  Component Value Date   INR 1.11 04/30/2012   No results found for this basename: PTT    Vital Signs  BP Readings from Last 3 Encounters:  05/04/12 146/67  05/04/12 146/67  01/12/12 196/71   Temp Readings from Last 3 Encounters:  05/04/12 97.3 F (36.3 C) Oral  05/04/12 97.3 F (36.3 C) Oral  01/12/12 98.2 F (36.8 C) Oral   SpO2 Readings from Last 3 Encounters:  05/04/12 98%  05/04/12 98%  01/12/12 96%   Pulse Readings from Last 3 Encounters:  05/04/12 89  05/04/12 89  01/12/12 90     Physical Examination  right upper Incision is healing well, skin color is normal , hand grip is 5/5, sensation in digits is intact;  There is a good thrill and good bruit in the right radiocephalic AVF.  Assessment/Plan Seth [name] is a 46 y.o. year old male who presents s/p creation of right upper extremity Hemodialysis access. Follow-up in 4 weeks  The patient's access will be ready for use in 12  weeks  Call for any further assistance.  [place] J 05/04/2012 8:02 AM

## 2012-05-04 NOTE — Progress Notes (Signed)
Subjective:    Interval Events:  Patient feels great this morning. He is without complaint and specifically denies dyspnea, chest pain, abdominal pain, headache, and dizziness. He was dialyzed yesterday and had a radial to cephalic AVF created by Dr. Kellie Simmering.  His paperwork for outpatient dialysis has not been finalized.    Objective:    Vital Signs:   Temp:  [97 F (36.1 C)-98.1 F (36.7 C)] 97.3 F (36.3 C) (07/26 0616) Pulse Rate:  [80-94] 89  (07/26 0616) Resp:  [15-25] 20  (07/26 0616) BP: (118-191)/(67-102) 146/67 mmHg (07/26 0616) SpO2:  [93 %-100 %] 98 % (07/26 0616) Weight:  [342 lb 13 oz (155.5 kg)-344 lb 5.7 oz (156.2 kg)] 344 lb 5.7 oz (156.2 kg) (07/26 0616) Last BM Date: 05/03/12   Weights: Admission weight: 172kg  24-hour Weight change: -7 lb 7.8 oz (-3.395 kg)  Filed Weights   05/03/12 0530 05/03/12 0919 05/04/12 0616  Weight: 352 lb 8.3 oz (159.9 kg) 342 lb 13 oz (155.5 kg) 344 lb 5.7 oz (156.2 kg)    Intake/Output:   Intake/Output Summary (Last 24 hours) at 05/04/12 0648 Last data filed at 05/04/12 0630  Gross per 24 hour  Intake   1340 ml  Output   4305 ml  Net  -2965 ml      Physical Exam: General appearance: alert, cooperative and no distress  Resp: clear to auscultation bilaterally  Cardio: regular rate and rhythm, S1, S2 normal, no murmur, click, rub or gallop  GI: soft, non-tender; bowel sounds normal; no masses, no organomegaly  Extremities: incision over right arm from AVF creation is without drainage or tenderness; thrill palpated over new fistula; lower extremity edema 2+   Labs: Basic Metabolic Panel:  Lab 0000000 0630 05/03/12 0635 05/02/12 0625 05/01/12 0528 04/30/12 1233 04/30/12 0516  NA 138 139 142 143 -- 138  K 4.7 4.2 4.9 5.3* -- 5.4*  CL 95* 95* 99 101 -- 99  CO2 29 28 28 28  -- 23  GLUCOSE 163* 129* 118* 90 -- 114*  BUN 48* 74* 67* 80* -- 90*  CREATININE 7.01* 8.68* 7.84* 8.69* -- 9.27*  CALCIUM 7.6* 6.9* 7.1* -- --  --  MG -- -- -- -- 1.9 --  PHOS 6.7* 7.3* 7.3* -- 7.9* --   Liver Function Tests:  Lab 05/04/12 0630 05/03/12 0635 05/02/12 0625 04/30/12 1924 04/30/12 0516  AST -- -- -- -- 29  ALT -- -- -- 25 31  ALKPHOS -- -- -- -- 55  BILITOT -- -- -- -- 0.3  PROT -- -- -- -- 7.3  ALBUMIN 3.1* 3.0* 3.0* -- 2.9*   CBG:  Lab 05/04/12 0621 05/03/12 2134 05/03/12 1632 05/03/12 1134 05/03/12 0512  GLUCAP 136* 193* 197* 111* 123*   FOBT: Fecal Occult Bld NEGATIVE NEGATIVE     Medications:    Scheduled Medications:    . amLODipine  10 mg Oral Daily  . antiseptic oral rinse  15 mL Mouth Rinse BID  . aspirin EC  81 mg Oral Daily  . calcium carbonate  500 mg of elemental calcium Oral TID WC  . cefUROXime (ZINACEF)  IV  1.5 g Intravenous On Call to OR  . docusate sodium  100 mg Oral BID  . ferric gluconate (FERRLECIT/NULECIT) IV  125 mg Intravenous Q T,Th,Sa-HD  . furosemide  160 mg Intravenous Q6H  . insulin aspart  0-9 Units Subcutaneous TID WC  . sodium chloride  3 mL Intravenous Q12H  . Tamsulosin  HCl  0.4 mg Oral Daily  . Vitamin D (Ergocalciferol)  50,000 Units Oral Q7 days    PRN Medications: acetaminophen, acetaminophen, HYDROcodone-acetaminophen, ondansetron (ZOFRAN) IV, ondansetron, sodium chloride, DISCONTD: sodium chloride, DISCONTD: sodium chloride, DISCONTD: 0.9 % irrigation (POUR BTL), DISCONTD: alteplase, DISCONTD: feeding supplement (NEPRO CARB STEADY), DISCONTD: fentaNYL, DISCONTD: fentaNYL, DISCONTD: heparin 6000 unit irrigation, DISCONTD: heparin, DISCONTD: HYDROcodone-acetaminophen, DISCONTD: lidocaine DISCONTD: lidocaine-EPINEPHrine, DISCONTD: lidocaine-prilocaine, DISCONTD: meperidine (DEMEROL) injection, DISCONTD: midazolam, DISCONTD: midazolam, DISCONTD: pentafluoroprop-tetrafluoroeth, DISCONTD: promethazine   Assessment/ Plan:    1.   Chronic kidney disease, Stage V:  [name] is a 46 year old man who presents with increasing shortness of breath and increased  lower extremity swelling. Creatinine at admission was 9.3. His last known baseline Cr was ~ 3.70. UPC ratio was significantly elevated at 6.11. Dr. Justin Mend of nephrology was consulted, and an IJ dialysis catheter was placed and HD was initiated. A radial-cephalic AVF was created by Dr. Kellie Simmering of [facility] on July 25, and outpatient dialysis was arranged at [facility] for M/W/F to begin July 29. HBV and HIV were negative. Renal ultrasound, B12, folate, and TSH were normal. SPEP and UPEP are still pending.  - SPEP and UPEP are pending  - Continue Lasix 160mg  IV q6  - Daily RFP pending - HD tomorrow per renal  2.   Metabolic bone disease:  Secondary to #1. Phosphorus is high at 7.9, correct calcium is low at 7.2, and PTH is very high at 337. Vitamin D was low at 13.  - Continue OS-CAL, 1 tablet, qAC  - Vitamin D 50,000U weekly for six weeks   3.   Anemia:  Baseline hemoglobin was ~12 in November, 2012. Now it is 8.6 with an MCV of 85. Iron low at 29, TIBC normal at 283, and %Sat low at 10. Reticulocyte % is 1.8 (55.4 absolute), indicating poor hematopoetic response to this anemia. There is likely a component of iron deficiency as well as anemia secondary to CKD. Hgb is down to 9.0 this morning. FOBT is negative x1.  - IV ferric gluconate 125mg  3 times weekly with HD  - FOBT x1   4.   Diabetes Mellitus type II:  He is insulin controlled diabetes with no last known A1C. On arrival to the ED his blood sugar was low. We have stopped his Lantus and are covering him with SSI.  - Continue SSI  - Continue aspirin - Consider restarting Lantus at a low dose (< 5U)   5.   Hypertension:  Blood pressure on arrival to the ED had SBP in the 140s but with administration of Lasix he had a drop in his SBP to 110. Since then, he has become hypertensive. We have restarted his amlodipine 10mg  and tamsulosin 0.4mg .  Blood pressures are improving.  6.   Prophylaxis:  - SCDs  - docusate   7.   Disposition:   Discharge pending outpatient dialysis coordination.   Length of Stay: 4 days   Signed by:  Shann Medal. Juleen China, MD PGY-I, Internal Medicine Pager [phone]  05/04/2012, 6:48 AM

## 2012-05-05 ENCOUNTER — Inpatient Hospital Stay (HOSPITAL_COMMUNITY): Payer: Non-veteran care

## 2012-05-05 LAB — GLUCOSE, CAPILLARY
Glucose-Capillary: 167 mg/dL — ABNORMAL HIGH (ref 70–99)
Glucose-Capillary: 118 mg/dL — ABNORMAL HIGH (ref 70–99)

## 2012-05-05 LAB — RENAL FUNCTION PANEL
CO2: 27 mEq/L (ref 19–32)
Calcium: 7.6 mg/dL — ABNORMAL LOW (ref 8.4–10.5)
GFR calc Af Amer: 8 mL/min — ABNORMAL LOW (ref >90)
GFR calc non Af Amer: 7 mL/min — ABNORMAL LOW (ref >90)
Phosphorus: 6.2 mg/dL — ABNORMAL HIGH (ref 2.3–4.6)
Sodium: 137 mEq/L (ref 135–145)

## 2012-05-05 MED ORDER — INSULIN PEN NEEDLE 31G X 8 MM MISC: Status: DC

## 2012-05-05 MED ORDER — INSULIN ASPART 100 UNIT/ML ~~LOC~~ SOLN
0.0000 [IU] | Freq: Three times a day (TID) | SUBCUTANEOUS | Status: DC
  Administered 2012-05-05: 3 [IU] via SUBCUTANEOUS

## 2012-05-05 MED ORDER — CALCIUM ACETATE 667 MG PO CAPS: 667.0000 mg | ORAL_CAPSULE | Freq: Three times a day (TID) | ORAL | Status: DC

## 2012-05-05 MED ORDER — VITAMIN D (ERGOCALCIFEROL) 1.25 MG (50000 UNIT) PO CAPS: 50000.0000 [IU] | ORAL_CAPSULE | ORAL | Status: DC

## 2012-05-05 MED ORDER — INSULIN ASPART 100 UNIT/ML ~~LOC~~ SOLN: 0.0000 [IU] | Freq: Three times a day (TID) | SUBCUTANEOUS | Status: DC

## 2012-05-05 MED ORDER — HYDROCODONE-ACETAMINOPHEN 5-325 MG PO TABS
ORAL_TABLET | ORAL | Status: AC
  Administered 2012-05-05: 2
  Filled 2012-05-05: qty 2

## 2012-05-05 NOTE — Discharge Summary (Signed)
Patient Name:  Seth [name] MRN: [id number]  PCP: [facility] DOB:  [birth year]       Date of Admission:  04/30/2012  Date of Discharge:  05/05/2012      Attending Physician: Bartholomew Crews, MD         DISCHARGE DIAGNOSES: 1.   Stage V CKD - Now on dialysis via IJ cath - Fistula placed, maturing  2.   Metabolic bone disease - Started phosphate binder - Replacing vitamin D with 50,000U weekly for 6 weeks  3.   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   Type II diabetes - Insulin requirements recently decreased - We stopped Lantus - Home on NovoLog moderate sliding scale  5.   Hypertension - Continued amlodipine 10mg   DISPOSITION AND FOLLOW-UP: Seth [name] is to follow-up with the listed providers as detailed below, at which time, the following should be addressed:   1. Follow-up visits:  1. INTERNAL MEDICINE 1. DM:  Evaluate insulin requirements.  He may be able to achieve glycemic control on oral medications. 2. HTN:  Evaluate need for additional pharmacotherapy once his is being regularly dialyzed.  2. VASCULAR SURGERY 1. Right radial-cephalic AVF created by Dr. Kellie Simmering on July 25.  2. Labs / imaging needed:  1. RENAL FUNCTION PANEL: 1. Ca/Phos levels for MBD  2. VITAMIN D:  In September after vitamin D replacement finished.   Follow-up Information    Follow up with Vertell Novak, MD on 05/17/2012. (INTERNAL MEDICINE OUTPATIENT CLINIC - Your appointment is on Thursday, August 8 at 9:15 --> please arrive no later than 9:00)    Contact information:   [address] [place] [phone]       Follow up with Tinnie Gens, MD on 05/31/2012. (VASCULAR SURGERY - Your appointment is at 10:15 on Tuesday, August 22.  This is follow-up for your fistula.)    Contact information:   [address] [place] [phone]          Discharge Orders    Future Appointments: Provider: Department: Dept Phone: Center:   05/17/2012 9:15 AM [name], [place] [phone] [facility]   05/17/2012 10:30 AM [place], RD [facility] [phone] [facility]   06/05/2012 10:15 AM Mal [name], MD [facility] [phone] [facility]     Future Orders Please Complete By Expires   Ambulatory Referral to DSME/T      Comments:   Referral sent to diabetes in-pt program to establish pt with IM teaching clinic.  Therefore I am passing this referral to the correct dept.   Questions: Responses:   Check all special needs that apply to patient requiring 1 on 1 DSME/T additional hours requested   DSME/T Content Diabetes as disease process   Complications/Comorbidities Kidney disease   Choose type of training services and number of hours requested Follow-up DSME/T:  enter hours in comments       DISCHARGE MEDICATIONS: Medication List  As of 05/05/2012  2:20 PM   STOP taking these medications         CALCIUM CARBONATE PO      ferrous sulfate 325 (65 FE) MG tablet      insulin glargine 100 UNIT/ML injection         TAKE these medications         amLODipine 10 MG  tablet   Commonly known as: NORVASC   Take 10 mg by mouth daily.      aspirin EC 81 MG tablet   Take 81 mg by mouth daily.      calcium acetate 667 MG capsule   Commonly known as: PHOSLO   Take 1 capsule (667 mg total) by mouth 3 (three) times daily with meals.      insulin aspart 100 UNIT/ML injection   Commonly known as: novoLOG   Inject 0-15 Units into the skin 3 (three) times daily with meals.      Insulin Pen Needle 31G X 8 MM Misc   Use to inject sliding scale insulin three times daily before meals.      Tamsulosin HCl 0.4 MG Caps   Commonly known as: FLOMAX   Take 0.4 mg by mouth daily.      Vitamin D (Ergocalciferol) 50000 UNITS Caps   Commonly known as: DRISDOL   Take 1 capsule (50,000 Units total) by mouth every 7 (seven) days.              CONSULTS: 1. Nephrology 2. Vascular Surgery   PROCEDURES PERFORMED:  X-ray Chest Pa And Lateral  05/01/2012 IMPRESSION: Improving CHF pattern  Dg Chest 2 View 04/30/2012 IMPRESSION:  1.  Cardiomegaly. 2.  Central venous congestion and mild pulmonary edema. 3.  Nodular density in the left upper lobe.  Recommend follow-up radiographs after acute symptomology resolves to determine if this is a true lesion.   US Renal 04/30/2012 IMPRESSION: Limited evaluation of the kidneys.  Negative for hydronephrosis.  Ir Fluoro Guide Cv Line Right 04/30/2012 IMPRESSION:  Successful placement of 23 cm tip to cuff tunneled hemodialysis catheter via the right internal jugular vein with tips terminating within the superior aspect of the right atrium.  The catheter is ready for immediate use.    ADMISSION DATA: H&P: [name] is a 46 year old man with a PMH significant for Diabetes Mellitus type II, Hypertension, Hyperlipidemia, morbid obesity, and chronic kidney disease with a baseline creatinine of about 3.5-4 who presents to the [facility] ED with complaints of about 1 week of increasing shortness of breath with minimal exertion. He states that over the last week he has had problems with becoming very short of breath with minimal exertion, problems breathing at night, problems breathing while lying flat, and increased swelling in his legs bilaterally. He denies any chest pain over this time, increasing fullness in his abdomen, fever, chills, vomiting, sleepiness, or cough. He does state that he has been feeling more weak lately, more tired during the day, and has had a vague feeling of nausea. [facility] with [facility] and gets his medications through them.   Physical Exam: Vitals: Blood pressure 142/89, pulse 80, temperature 98.4 F (36.9 C), temperature source Oral, resp. rate 23, SpO2 98.00%.  Constitutional: Vital signs reviewed. Patient is a well-developed and well-nourished [place],  morbidly obese man in no acute distress and cooperative with exam. Alert and oriented x3.  Head: Normocephalic and atraumatic  Ear: TM normal bilaterally  Mouth: no erythema or exudates, MMM  Eyes: PERRL, EOMI, conjunctivae normal, No scleral icterus.  Neck: Supple, JVD to the angle of the jaw. Trachea midline normal ROM, No mass, thyromegaly, or carotid bruit present.  Cardiovascular: RRR, S1 normal, S2 normal, no MRG, pulses symmetric and intact bilaterally  Pulmonary/Chest: difficult to hear given body habitus but mild crackles in the bilateral bases. no wheezes, or rhonchi  Abdominal:  Soft. Obese, Non-tender, non-distended, bowel sounds are normal, no masses, organomegaly, or guarding present.  GU: no CVA tenderness Musculoskeletal: 3+ pitting edema in the bilateral lower extremities to the mid thigh. No joint deformities, erythema, or stiffness, ROM full and no nontender Hematology: no cervical, inginal, or axillary adenopathy.  Neurological: A&O x3, Strength is normal and symmetric bilaterally, cranial nerve II-XII are grossly intact, no focal motor deficit, sensory intact to light touch bilaterally.  Skin: Warm, dry and intact. No rash, cyanosis, or clubbing.  Psychiatric: Normal mood and affect. speech and behavior is normal. Judgment and thought content normal. Cognition and memory are normal.   Labs: Basic Metabolic Panel:  Basename  04/30/12 0516   NA  138   K  5.4*   CL  99   CO2  23   GLUCOSE  114*   BUN  90*   CREATININE  9.27*   CALCIUM  6.3*   MG  --   PHOS  --    Liver Function Tests:  [facility]  04/30/12 0516   AST  29   ALT  31   ALKPHOS  55   BILITOT  0.3   PROT  7.3   ALBUMIN  2.9*    CBC:  Basename  04/30/12 0516   WBC  9.8   NEUTROABS  8.2*   HGB  8.6*   HCT  26.0*   MCV  84.7   PLT  267    Cardiac Enzymes:  Basename  04/30/12 0516   CKTOTAL  --   CKMB  --   CKMBINDEX  --   TROPONINI  <0.30    BNP:  Basename  04/30/12 0516   PROBNP   15902.0*    CBG:  Basename  04/30/12 0814  04/30/12 0555  04/30/12 0426   GLUCAP  86  127*  47*     HOSPITAL COURSE: 1. Chronic kidney disease, Stage V: Mr. Dinardi is a 47 year old man who presents with increasing shortness of breath and increased lower extremity swelling. Creatinine at admission was 9.3. His last known baseline Cr was ~ 3.70. UPC ratio was significantly elevated at 6.11. Dr. Justin Mend of nephrology was consulted, and an IJ dialysis catheter was placed and HD was initiated. A radial-cephalic AVF was created by Dr. Kellie Simmering of [facility] on July 25, and outpatient dialysis was arranged at [facility] for M/W/F to begin July 29. HBV and HIV were negative. Renal ultrasound, B12, folate, and TSH were normal. SPEP and UPEP were unremarkable.   2. Metabolic bone disease: Secondary to #1. Phosphorus has been high, corrected calcium low, and PTH very high at 337. Vitamin D was low at 13. He was discharged on calcium acetate qAC, and we are replacing his vitamin D with 50,000U ergocalciferol once weekly for six weeks. A repeat vitamin D level will be needed in September.  3. Anemia: Baseline hemoglobin was ~12 in November, 2012. Now it is 8.6 with an MCV of 85. Iron low at 29, TIBC normal at 283, and %Sat low at 10. Reticulocyte % is 1.8 (55.4 absolute), indicating poor hematopoetic response to this anemia. There is likely a component of iron deficiency as well as anemia secondary to CKD. FOBT has been negative x2. He is getting IV ferric gluconate with HD.  4. Diabetes Mellitus type II: He is insulin controlled diabetes with no last known A1C. On arrival to the ED his blood sugar was low. We stopped his Lantus and covered him  with SSI. He was discharge home on a moderate sliding scale with NovoLog. At follow-up, we need to asses his insulin requirements; he may be able to achieve adequate glycemic control on oral medications. He is also taking aspirin daily.   5. Hypertension: Blood pressure  on arrival at the ED was elevated with SBP in the 140s, but with administration of Lasix, this dropped to 110. Since then, he become hypertensive. We restarted his amlodipine 10mg  and tamsulosin 0.4mg . Blood pressures are better but still elevated at times. This lability may be coming from his labile fluid status. We will address blood pressures once a regular dialysis schedule is achieved.  He was discharged home on amlodipine 10mg .   DISCHARGE DATA: Vital Signs: BP 135/88  Pulse 86  Temp 97 F (36.1 C) (Oral)  Resp 18  Ht 5\' 8"  (1.727 m)  Wt 348 lb 1.7 oz (157.9 kg)  BMI 52.93 kg/m2  SpO2 99%  Labs: Results for orders placed during the hospital encounter of 04/30/12 (from the past 24 hour(s))  GLUCOSE, CAPILLARY     Status: Abnormal   Collection Time   05/04/12  4:04 PM      Component Value Range   Glucose-Capillary 174 (*) 70 - 99 mg/dL   Comment 1 Notify RN     Comment 2 Documented in Chart    GLUCOSE, CAPILLARY     Status: Abnormal   Collection Time   05/04/12 10:12 PM      Component Value Range   Glucose-Capillary 185 (*) 70 - 99 mg/dL   Comment 1 Notify RN    GLUCOSE, CAPILLARY     Status: Abnormal   Collection Time   05/05/12  5:31 AM      Component Value Range   Glucose-Capillary 167 (*) 70 - 99 mg/dL  RENAL FUNCTION PANEL     Status: Abnormal   Collection Time   05/05/12  6:15 AM      Component Value Range   Sodium 137  135 - 145 mEq/L   Potassium 4.6  3.5 - 5.1 mEq/L   Chloride 94 (*) 96 - 112 mEq/L   CO2 27  19 - 32 mEq/L   Glucose, Bld 168 (*) 70 - 99 mg/dL   BUN 63 (*) 6 - 23 mg/dL   Creatinine, Ser 8.26 (*) 0.50 - 1.35 mg/dL   Calcium 7.6 (*) 8.4 - 10.5 mg/dL   Phosphorus 6.2 (*) 2.3 - 4.6 mg/dL   Albumin 2.8 (*) 3.5 - 5.2 g/dL   GFR calc non Af Amer 7 (*) >90 mL/min   GFR calc Af Amer 8 (*) >90 mL/min     Time spent on discharge: 30 minutes   Signed by:  Shann Medal. Juleen China, MD PGY-I, Internal Medicine  05/05/2012, 2:20 PM

## 2012-05-05 NOTE — Progress Notes (Signed)
[facility] ROUNDING NOTE   Subjective:   Interval History: improved  Objective:  Vital signs in last 24 hours:  Temp:  [97.5 F (36.4 C)-98.1 F (36.7 C)] 97.6 F (36.4 C) (07/27 0453) Pulse Rate:  [87-93] 87  (07/27 0453) Resp:  [18-19] 18  (07/27 0453) BP: (122-151)/(9-86) 151/82 mmHg (07/27 0453) SpO2:  [92 %-99 %] 99 % (07/27 0453) Weight:  [158.033 kg (348 lb 6.4 oz)] 158.033 kg (348 lb 6.4 oz) (07/27 0453)  Weight change: 2.533 kg (5 lb 9.4 oz) Filed Weights   05/03/12 0919 05/04/12 0616 05/05/12 0453  Weight: 155.5 kg (342 lb 13 oz) 156.2 kg (344 lb 5.7 oz) 158.033 kg (348 lb 6.4 oz)    Intake/Output: I/O last 3 completed shifts: In: 1389 [P.O.:1320; I.V.:3; IV Piggyback:66] Out: 1900 [Urine:1900]   Intake/Output this shift:     CVS- RRR RS- CTA ABD- BS present soft non-distended EXT- no edema   Basic Metabolic Panel:  Lab 0000000 0630 05/03/12 0635 05/02/12 0625 05/01/12 0528 04/30/12 1233 04/30/12 0516  NA 138 139 142 143 -- 138  K 4.7 4.2 4.9 5.3* -- 5.4*  CL 95* 95* 99 101 -- 99  CO2 29 28 28 28  -- 23  GLUCOSE 163* 129* 118* 90 -- 114*  BUN 48* 74* 67* 80* -- 90*  CREATININE 7.01* 8.68* 7.84* 8.69* -- 9.27*  CALCIUM 7.6* 6.9* 7.1* -- -- --  MG -- -- -- -- 1.9 --  PHOS 6.7* 7.3* 7.3* -- 7.9* --    Liver Function Tests:  Lab 05/04/12 0630 05/03/12 0635 05/02/12 0625 04/30/12 1924 04/30/12 0516  AST -- -- -- -- 29  ALT -- -- -- 25 31  ALKPHOS -- -- -- -- 55  BILITOT -- -- -- -- 0.3  PROT -- -- -- -- 7.3  ALBUMIN 3.1* 3.0* 3.0* -- 2.9*   No results found for this basename: LIPASE:5,AMYLASE:5 in the last 168 hours No results found for this basename: AMMONIA:3 in the last 168 hours  CBC:  Lab 05/03/12 0635 05/02/12 0625 04/30/12 0516  WBC 8.3 8.7 9.8  NEUTROABS -- -- 8.2*  HGB 9.0* 9.1* 8.6*  HCT 27.9* 28.7* 26.0*  MCV 85.3 85.9 84.7  PLT 280 281 267    Cardiac Enzymes:  Lab 04/30/12 0516  CKTOTAL --  CKMB --    CKMBINDEX --  TROPONINI <0.30    BNP: No components found with this basename: POCBNP:5  CBG:  Lab 05/05/12 0531 05/04/12 2212 05/04/12 1604 05/04/12 1105 05/04/12 0621  GLUCAP 167* 185* 174* 203* 136*    Microbiology: Results for orders placed during the hospital encounter of 04/30/12  SURGICAL PCR SCREEN     Status: Normal   Collection Time   05/03/12  4:56 AM      Component Value Range Status Comment   MRSA, PCR NEGATIVE  NEGATIVE Final    Staphylococcus aureus NEGATIVE  NEGATIVE Final     Coagulation Studies: No results found for this basename: LABPROT:5,INR:5 in the last 72 hours  Urinalysis: No results found for this basename: COLORURINE:2,APPERANCEUR:2,LABSPEC:2,PHURINE:2,GLUCOSEU:2,HGBUR:2,BILIRUBINUR:2,KETONESUR:2,PROTEINUR:2,UROBILINOGEN:2,NITRITE:2,LEUKOCYTESUR:2 in the last 72 hours    Imaging: No results found.   Medications:        . amLODipine  10 mg Oral Daily  . antiseptic oral rinse  15 mL Mouth Rinse BID  . aspirin EC  81 mg Oral Daily  . calcium acetate  667 mg Oral TID WC  . calcium carbonate  500 mg of elemental calcium Oral  TID WC  . docusate sodium  100 mg Oral BID  . ferric gluconate (FERRLECIT/NULECIT) IV  125 mg Intravenous Q T,Th,Sa-HD  . insulin aspart  0-15 Units Subcutaneous TID WC  . sodium chloride  3 mL Intravenous Q12H  . Tamsulosin HCl  0.4 mg Oral Daily  . Vitamin D (Ergocalciferol)  50,000 Units Oral Q7 days  . DISCONTD: furosemide  160 mg Intravenous Q6H  . DISCONTD: insulin aspart  0-9 Units Subcutaneous TID WC   acetaminophen, acetaminophen, heparin, HYDROcodone-acetaminophen, ondansetron (ZOFRAN) IV, ondansetron, sodium chloride  Assessment/ Plan:  Mr. 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 Will dialyze today   LOS: 5 Thomasenia Dowse,[place] W @TODAY @8 :38 AM

## 2012-05-05 NOTE — Progress Notes (Signed)
Subjective:    Interval Events:  Patient feels great this morning. He is without complaint and specifically denies dyspnea, chest pain, abdominal pain, headache, and dizziness.  He has a slot at [facility] for dialysis M/W/F.    Objective:    Vital Signs:   Temp:  [97 F (36.1 C)-98.1 F (36.7 C)] 97 F (36.1 C) (07/27 0905) Pulse Rate:  [84-93] 84  (07/27 1020) Resp:  [18-19] 18  (07/27 0905) BP: (121-178)/(56-94) 121/66 mmHg (07/27 1020) SpO2:  [92 %-99 %] 99 % (07/27 0905) Weight:  [348 lb 1.7 oz (157.9 kg)-348 lb 6.4 oz (158.033 kg)] 348 lb 1.7 oz (157.9 kg) (07/27 0905) Last BM Date: 05/03/12   Weights: Admission weight: 172kg  24-hour Weight change: 5 lb 9.4 oz (2.533 kg)  Filed Weights   05/04/12 0616 05/05/12 0453 05/05/12 0905  Weight: 344 lb 5.7 oz (156.2 kg) 348 lb 6.4 oz (158.033 kg) 348 lb 1.7 oz (157.9 kg)    Intake/Output:   Intake/Output Summary (Last 24 hours) at 05/05/12 1037 Last data filed at 05/05/12 0800  Gross per 24 hour  Intake    840 ml  Output    900 ml  Net    -60 ml      Physical Exam: General appearance: alert, cooperative and no distress  Resp: clear to auscultation bilaterally  Cardio: regular rate and rhythm, S1, S2 normal, no murmur, click, rub or gallop  GI: soft, non-tender; bowel sounds normal; no masses, no organomegaly  Extremities: incision over right arm from AVF creation is without drainage or tenderness; thrill palpated over new fistula; lower extremity edema 2+   Labs: Basic Metabolic Panel:  Lab [id number] 0615 05/04/12 0630 05/03/12 0635 05/02/12 0625 05/01/12 0528 04/30/12 1233  NA 137 138 139 142 143 --  K 4.6 4.7 4.2 4.9 5.3* --  CL 94* 95* 95* 99 101 --  CO2 27 29 28 28 28  --  GLUCOSE 168* 163* 129* 118* 90 --  BUN 63* 48* 74* 67* 80* --  CREATININE 8.26* 7.01* 8.68* 7.84* 8.69* --  CALCIUM 7.6* 7.6* 6.9* -- -- --  MG -- -- -- -- -- 1.9  PHOS 6.2* 6.7* 7.3* 7.3* -- 7.9*   Liver Function Tests:  Lab  05/05/12 0615 05/04/12 0630 05/03/12 0635 05/02/12 0625 04/30/12 1924 04/30/12 0516  AST -- -- -- -- -- 29  ALT -- -- -- -- 25 31  ALKPHOS -- -- -- -- -- 55  BILITOT -- -- -- -- -- 0.3  PROT -- -- -- -- -- 7.3  ALBUMIN 2.8* 3.1* 3.0* 3.0* -- 2.9*   CBG:  Lab 05/05/12 0531 05/04/12 2212 05/04/12 1604 05/04/12 1105 05/04/12 0621  GLUCAP 167* 185* 174* 203* 136*    Other results: EKG: normal EKG, normal sinus rhythm.   Imaging: No results found.    Medications:    Scheduled Medications:    . amLODipine  10 mg Oral Daily  . antiseptic oral rinse  15 mL Mouth Rinse BID  . aspirin EC  81 mg Oral Daily  . calcium acetate  667 mg Oral TID WC  . calcium carbonate  500 mg of elemental calcium Oral TID WC  . docusate sodium  100 mg Oral BID  . ferric gluconate (FERRLECIT/NULECIT) IV  125 mg Intravenous Q T,Th,Sa-HD  . HYDROcodone-acetaminophen      . insulin aspart  0-15 Units Subcutaneous TID WC  . sodium chloride  3 mL Intravenous Q12H  . Tamsulosin  HCl  0.4 mg Oral Daily  . Vitamin D (Ergocalciferol)  50,000 Units Oral Q7 days  . DISCONTD: insulin aspart  0-9 Units Subcutaneous TID WC     PRN Medications: acetaminophen, acetaminophen, heparin, HYDROcodone-acetaminophen, ondansetron (ZOFRAN) IV, ondansetron, sodium chloride   Assessment/ Plan:    1. Chronic kidney disease, Stage V: [name] is a 46 year old man who presents with increasing shortness of breath and increased lower extremity swelling. Creatinine at admission was 9.3. His last known baseline Cr was ~ 3.70. UPC ratio was significantly elevated at 6.11. Dr. Justin Mend of nephrology was consulted, and an IJ dialysis catheter was placed and HD was initiated. A radial-cephalic AVF was created by Dr. Kellie Simmering of [facility] on July 25, and outpatient dialysis was arranged at [facility] for M/W/F to begin July 29. HBV and HIV were negative. Renal ultrasound, B12, folate, and TSH were normal. SPEP and UPEP were  unremarkable.  2. Metabolic bone disease: Secondary to #1. Phosphorus has been hight, correct calcium has been low, and PTH is very high at 337. Vitamin D was low at 13. He is taking calcium acetate and calcium carbonate qAC, and we are replacing his vitamin D with 50,000U ergocalciferol once weekly for six weeks.  3. Anemia: Baseline hemoglobin was ~12 in November, 2012. Now it is 8.6 with an MCV of 85. Iron low at 29, TIBC normal at 283, and %Sat low at 10. Reticulocyte % is 1.8 (55.4 absolute), indicating poor hematopoetic response to this anemia. There is likely a component of iron deficiency as well as anemia secondary to CKD. FOBT has been negative x2.  He is getting IV ferric gluconate with HD.   4. Diabetes Mellitus type II: He is insulin controlled diabetes with no last known A1C. On arrival to the ED his blood sugar was low. We have stopped his Lantus and are covering him with SSI. We will discharge him home on a moderate sliding scale with NovoLog, and the assess his insulin requirements at follow-up.  He is also taking aspirin daily.  5. Hypertension: Blood pressure on arrival to the ED had SBP in the 140s but with administration of Lasix he had a drop in his SBP to 110. Since then, he has become hypertensive. We have restarted his amlodipine 10mg  and tamsulosin 0.4mg . Blood pressures are better but still elevated at times.  This lability may be coming from his labile fluid status.  We will address blood pressures once a regular dialysis schedule is achieved.  6. Prophylaxis:  - SCDs  - docusate   7. Disposition: Discharge today after HD.   Length of Stay: 5 days   Signed by:  Shann Medal. Juleen China, MD PGY-I, Internal Medicine Pager [phone]  05/05/2012, 10:36 AM

## 2012-05-07 LAB — GLUCOSE, CAPILLARY

## 2012-05-17 ENCOUNTER — Encounter: Payer: Self-pay | Admitting: Dietician

## 2012-05-17 ENCOUNTER — Ambulatory Visit (INDEPENDENT_AMBULATORY_CARE_PROVIDER_SITE_OTHER): Payer: Non-veteran care | Admitting: Dietician

## 2012-05-17 ENCOUNTER — Ambulatory Visit (INDEPENDENT_AMBULATORY_CARE_PROVIDER_SITE_OTHER): Payer: Non-veteran care | Admitting: Internal Medicine

## 2012-05-17 ENCOUNTER — Encounter: Payer: Self-pay | Admitting: Internal Medicine

## 2012-05-17 VITALS — BP 123/71 | HR 93 | Temp 97.1°F | Ht 67.0 in | Wt 334.5 lb

## 2012-05-17 DIAGNOSIS — I120 Hypertensive chronic kidney disease with stage 5 chronic kidney disease or end stage renal disease: Secondary | ICD-10-CM

## 2012-05-17 DIAGNOSIS — N186 End stage renal disease: Secondary | ICD-10-CM

## 2012-05-17 DIAGNOSIS — Z23 Encounter for immunization: Secondary | ICD-10-CM

## 2012-05-17 DIAGNOSIS — E119 Type 2 diabetes mellitus without complications: Secondary | ICD-10-CM

## 2012-05-17 DIAGNOSIS — I10 Essential (primary) hypertension: Secondary | ICD-10-CM

## 2012-05-17 MED ORDER — CALCIUM ACETATE 667 MG PO CAPS: 667.0000 mg | ORAL_CAPSULE | Freq: Three times a day (TID) | ORAL | Status: AC

## 2012-05-17 MED ORDER — CALCIUM ACETATE 667 MG PO CAPS: 667.0000 mg | ORAL_CAPSULE | Freq: Three times a day (TID) | ORAL | Status: DC

## 2012-05-17 NOTE — Assessment & Plan Note (Signed)
Patient was started on dialysis during recent hospital stay and does continue to go on Monday Wednesday Fridays. He does have temporary dialysis catheter in right chest. He also has fistula maturing on right forearm. He will followup with Dr. Kellie Simmering at the end of this month. Did refill his PhosLo at today's visit.

## 2012-05-17 NOTE — Assessment & Plan Note (Signed)
Patient states he has lost some weight since initiating dialysis. I believe that this is water weight however he does seem motivated to lose weight and I did encourage this. He states his goal is 100 pounds.

## 2012-05-17 NOTE — Assessment & Plan Note (Signed)
Blood pressure well controlled on single agent amlodipine. We'll continue to follow.

## 2012-05-17 NOTE — Progress Notes (Signed)
Medical Nutrition Therapy:  Appt start time: 1030 end time:  1100.  Assessment:  Primary concerns today: Meal planning.  Patient started dialysis. Using only correction insulin reports 150-200 3 units, 201-250- 5 units, 251-300- 8 units and so on. Blood sugars 190-244 fasting x 1 week,               lower 127-169 at lunch  High again @197 -227 before dinner Usual eating pattern includes 3 meals and 1 snacks per day. weight decreased 2#.  Usual physical activity includes playing basket ball, walking and ADLs. Everyday foods include eggs, cereal,bread, water.  Avoided foods include salt, convenience foods. 24-hr recall: B   Eggs x2, liver pudding, toast    L - [place] sand, fruit and water   D - baked chicken, rice and broccoli  Snk -Graham crackers  Diet estimated to be appropriate in carbs, protein, potassium, phosphorus and sodium. Likely inadequate in vitamins and minerals. Nephrovite not listed on medication list.  Note vitamin D. Lipids, blood pressure and a1C at target. Albumin 2.8 on 05/05/12.  Progress Towards Goal(s):  In progress.   Nutritional Diagnosis:  [place]-2.2 Altered nutrition-related laboratory As related to protein malnutrition, Chronic kidney disease and recent hospitalization.  As evidenced by low albumin.     Intervention:  1-  Nutrition education encouraging more fruits, dairy and vegetables. 2- Nutrition education about high phosphorus foods to avoid./limit 3- NUtrition education about how  to liven up food with spices, lemon and vinegar 4- Coordination of care- feel patient may benefit form small dose of basal insulin at bedtime to improve his blood sugars and nephrovite vitamin once daily,   Monitoring/Evaluation:  Dietary intake, exercise, blood sugars, and body weight in 2 month(s).

## 2012-05-17 NOTE — Assessment & Plan Note (Signed)
Patient is taking NovoLog per moderate sliding scale. Insulin requirement has diminished since initiation of dialysis. He will follow up with our diabetic educator at today's visit. Did do foot exam and give Pneumovax at today's visit.

## 2012-05-17 NOTE — Patient Instructions (Addendum)
You were seen for a hospital follow up and we are not making any changes to your regimen today. Continue to monitor your sugars and only give yourself insulin as needed. Continue to go to dialysis. Feel free to call our office with any questions or concerns. Our number is [phone]. We did give you a pneumonia vaccine today.

## 2012-05-17 NOTE — Progress Notes (Signed)
Subjective:     [name], male   DOB: [birth year], 46 y.o.   MRN: [id number]  HPI The patient is a 46 year old man who is new to our clinic today after a hospital stay. He did initiate dialysis during this hospital stay and is currently going Monday Wednesday Fridays to dialysis and did have fistula formation during this hospital stay. It is currently maturing. He has not had any complaints since leaving the hospital and states that he feels like a new man after initiating dialysis. He is has more energy, has lost 30 pounds. He states that he is able to play with his kids and walk around now and was previously unable to do this. No falls no concerns no problems breathing at home. No problems with dialysis. He states he maintains a positive attitude in order to remain happy in life. He states that his drug Carmen for insulin has decreased dramatically since starting dialysis. He is using a sliding scale and checking his sugars prior to mealtimes and at bedtime. He states that he only uses about 15 units of Novolin daily as compared to 45 units prior to initiating dialysis. He does need refill of his PhosLo at today's visit. He states that he gets his medications from the [place] however they take several weeks to arrive and therefore he has been having to pay for them out of pocket which is a hardship for him.  Review of Systems  Constitutional: Positive for activity change. Negative for fever, chills, diaphoresis, appetite change, fatigue and unexpected weight change.       Patient has been able to do more activities at home recently.  HENT: Negative.   Eyes: Negative.   Respiratory: Negative for cough, chest tightness, shortness of breath and wheezing.   Cardiovascular: Negative for chest pain, palpitations and leg swelling.  Gastrointestinal: Negative for nausea, vomiting, abdominal pain, diarrhea, constipation and abdominal distention.  Musculoskeletal: Negative.   Skin: Negative.     Neurological: Negative.  Negative for dizziness.  Hematological: Negative.        Objective:   Physical Exam  Nursing note and vitals reviewed. Constitutional: He is oriented to person, place, and time. He appears well-developed and well-nourished.       Morbidly obese.  HENT:  Head: Normocephalic and atraumatic.  Eyes: EOM are normal. Pupils are equal, round, and reactive to light.  Neck: Normal range of motion. Neck supple.  Cardiovascular: Normal rate and regular rhythm.        Patient did have good thrill and bruit in the fistula on right forearm.  Distant heart sounds.  Pulmonary/Chest: Effort normal and breath sounds normal.  Abdominal: Soft. Bowel sounds are normal.  Musculoskeletal: Normal range of motion.       Foot exam done at today's visit, please see documentation.  Neurological: He is alert and oriented to person, place, and time.       Assessment/Plan:   1. Please see problem oriented charting.  2. Disposition-the face and will be seen back in 2 months for followup. He also has appointment with diabetic educator immediately following this visit. He was given Pneumovax at today's visit however none of his other medications were changed.

## 2012-05-18 ENCOUNTER — Encounter: Payer: Non-veteran care | Admitting: Internal Medicine

## 2012-06-04 ENCOUNTER — Encounter: Payer: Self-pay | Admitting: Vascular Surgery

## 2012-06-05 ENCOUNTER — Ambulatory Visit: Payer: Non-veteran care | Admitting: Vascular Surgery

## 2012-06-07 ENCOUNTER — Other Ambulatory Visit: Payer: Self-pay | Admitting: Internal Medicine

## 2012-08-20 ENCOUNTER — Encounter: Payer: Self-pay | Admitting: Vascular Surgery

## 2012-08-21 ENCOUNTER — Ambulatory Visit (INDEPENDENT_AMBULATORY_CARE_PROVIDER_SITE_OTHER): Payer: Non-veteran care | Admitting: Vascular Surgery

## 2012-08-21 ENCOUNTER — Encounter: Payer: Self-pay | Admitting: Vascular Surgery

## 2012-08-21 VITALS — BP 173/81 | HR 81 | Ht 67.0 in | Wt 325.0 lb

## 2012-08-21 DIAGNOSIS — N186 End stage renal disease: Secondary | ICD-10-CM | POA: Insufficient documentation

## 2012-08-21 NOTE — Progress Notes (Signed)
Subjective:     [name], male   DOB: [birth year], 46 y.o.   MRN: [id number]  HPI this 46 year old male returns for initial followup regarding his right radial-cephalic AV fistula which I created 05/03/2012. He denies any numbness in the hand. He has no pain in the right hand. He is left-handed. The official has not been utilized. He started on hemodialysis in July of 2013 and has a catheter in his right IJ.  Past Medical History  Diagnosis Date  . Diabetes mellitus   . Hypertension   . Chronic kidney disease   . RETINAL DETACHMENT, HX OF 06/20/2007    Qualifier: Diagnosis of  By: Vinetta Bergamo RN, [name]      History  Substance Use Topics  . Smoking status: Never Smoker   . Smokeless tobacco: Never Used  . Alcohol Use: 0.6 oz/week    1 Cans of beer per week    Family History  Problem Relation Age of Onset  . Diabetes Mother   . Diabetes Sister   . Diabetes Brother     No Known Allergies  Current outpatient prescriptions:amLODipine (NORVASC) 10 MG tablet, Take 10 mg by mouth daily., Disp: , Rfl: ;  aspirin EC 81 MG tablet, Take 81 mg by mouth daily., Disp: , Rfl: ;  calcium acetate (PHOSLO) 667 MG capsule, Take 1 capsule (667 mg total) by mouth 3 (three) times daily with meals., Disp: 90 capsule, Rfl: 3 insulin aspart (NOVOLOG) 100 UNIT/ML injection, Inject 0-15 Units into the skin 3 (three) times daily with meals., Disp: 1 vial, Rfl: 0;  Insulin Pen Needle 31G X 8 MM MISC, Use to inject sliding scale insulin three times daily before meals., Disp: 100 each, Rfl: 0;  Tamsulosin HCl (FLOMAX) 0.4 MG CAPS, Take 0.4 mg by mouth daily., Disp: , Rfl:  Vitamin D, Ergocalciferol, (DRISDOL) 50000 UNITS CAPS, take 1 capsule by mouth every week, Disp: 5 capsule, Rfl: 0  BP 173/81  Pulse 81  Ht 5\' 7"  (1.702 m)  Wt 325 lb (147.419 kg)  BMI 50.90 kg/m2  SpO2 100%  Body mass index is 50.90 kg/(m^2).           Review of Systems denies chest pain, dyspnea on exertion, PND,  orthopnea. Has lost 50 pounds since starting on dialysis 3-1/2 months ago. Diabetes is under better control.     Objective:   Physical Exam blood pressure 173/81 heart rate 81 respirations 18 General alert and oriented x3 in no apparent distress Lungs no rhonchi or wheezing Cardiovascular regular and no murmurs carotid pulses 3+ no bruits Right upper extremity with well-healed distal incision over radial artery. AV fistula has excellent pulse and palpable thrill at the antecubital area. No evidence of steal distally.     Assessment:     Nicely functioning right radial artery to cephalic vein AV fistula in patient with end-stage renal disease-on hemodialysis since July 2013    Plan:     Okay to use right arm fistula at any time. Fistula has matured nicely. Patient to return to see [place] on when necessary basis

## 2012-10-01 ENCOUNTER — Other Ambulatory Visit (HOSPITAL_COMMUNITY): Payer: Self-pay | Admitting: Nephrology

## 2012-10-01 DIAGNOSIS — N186 End stage renal disease: Secondary | ICD-10-CM

## 2012-10-09 ENCOUNTER — Ambulatory Visit (HOSPITAL_COMMUNITY): Admission: RE | Admit: 2012-10-09 | Payer: Non-veteran care | Source: Ambulatory Visit

## 2012-10-11 ENCOUNTER — Ambulatory Visit (HOSPITAL_COMMUNITY)
Admission: RE | Admit: 2012-10-11 | Discharge: 2012-10-11 | Disposition: A | Discharge status: Home / Self Care | Payer: Non-veteran care | Source: Ambulatory Visit | Attending: Nephrology | Admitting: Nephrology

## 2012-10-11 DIAGNOSIS — N186 End stage renal disease: Secondary | ICD-10-CM

## 2012-10-11 DIAGNOSIS — N19 Unspecified kidney failure: Secondary | ICD-10-CM | POA: Insufficient documentation

## 2012-10-11 DIAGNOSIS — Z4901 Encounter for fitting and adjustment of extracorporeal dialysis catheter: Secondary | ICD-10-CM | POA: Insufficient documentation

## 2012-10-11 MED ORDER — CHLORHEXIDINE GLUCONATE 4 % EX LIQD
CUTANEOUS | Status: AC
  Filled 2012-10-11: qty 45

## 2012-10-11 NOTE — Procedures (Signed)
RIJV HD catheter removal

## 2013-03-14 ENCOUNTER — Encounter (HOSPITAL_COMMUNITY): Payer: Self-pay | Admitting: Emergency Medicine

## 2013-03-14 ENCOUNTER — Emergency Department (INDEPENDENT_AMBULATORY_CARE_PROVIDER_SITE_OTHER)
Admission: EM | Admit: 2013-03-14 | Discharge: 2013-03-14 | Disposition: A | Discharge status: Home / Self Care | Payer: Medicare Other | Source: Home / Self Care | Attending: Family Medicine | Admitting: Family Medicine

## 2013-03-14 DIAGNOSIS — IMO0001 Reserved for inherently not codable concepts without codable children: Secondary | ICD-10-CM | POA: Diagnosis not present

## 2013-03-14 DIAGNOSIS — L089 Local infection of the skin and subcutaneous tissue, unspecified: Secondary | ICD-10-CM

## 2013-03-14 MED ORDER — DOXYCYCLINE HYCLATE 100 MG PO CAPS: 100.0000 mg | ORAL_CAPSULE | Freq: Two times a day (BID) | ORAL | Status: DC

## 2013-03-14 MED ORDER — MUPIROCIN 2 % EX OINT: TOPICAL_OINTMENT | Freq: Three times a day (TID) | CUTANEOUS | Status: DC

## 2013-03-14 MED ORDER — TRIAMCINOLONE ACETONIDE 0.5 % EX OINT: TOPICAL_OINTMENT | Freq: Two times a day (BID) | CUTANEOUS | Status: DC

## 2013-03-14 NOTE — ED Provider Notes (Signed)
History     CSN: [id number]  Arrival date & time 03/14/13  1250   First MD Initiated Contact with Patient 03/14/13 1415      Chief Complaint  Patient presents with  . Abscess    (Consider location/radiation/quality/duration/timing/severity/associated sxs/prior treatment) HPI Comments: 47 year old diabetic male also on hemodialysis. Here complaining of multiple areas in upper extremities where he sustained insect bites while at the beach 1 week ago. Has had some areas with swelling, redness and pus drainage. Otherwise denies malaise, fever or chills. Has not taken any medications for his symptoms. States that an area in his left lower arm drained last night while he was at the shower.   Past Medical History  Diagnosis Date  . Diabetes mellitus   . Hypertension   . Chronic kidney disease   . 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 Av fistula placement  05/03/2012    Procedure: ARTERIOVENOUS (AV) FISTULA CREATION;  Surgeon: Mal [name], MD;  Location: [facility] OR;  Service: Vascular;  Laterality: Right;    Family History  Problem Relation Age of Onset  . Diabetes Mother   . Diabetes Sister   . Diabetes Brother     History  Substance Use Topics  . Smoking status: Never Smoker   . Smokeless tobacco: Never Used  . Alcohol Use: 0.6 oz/week    1 Cans of beer per week      Review of Systems  Constitutional: Negative for fever, chills, diaphoresis, activity change and appetite change.  Gastrointestinal: Negative for nausea and vomiting.  Musculoskeletal: Negative for myalgias and arthralgias.  Skin: Positive for rash.  Neurological: Negative for dizziness.    Allergies  Review of patient's allergies indicates no known allergies.  Home Medications   Current Outpatient Rx  Name  Route  Sig  Dispense  Refill  . amLODipine (NORVASC) 10 MG tablet   Oral   Take 10 mg by  mouth daily.         Marland Kitchen aspirin EC 81 MG tablet   Oral   Take 81 mg by mouth daily.         . calcium acetate (PHOSLO) 667 MG capsule   Oral   Take 1 capsule (667 mg total) by mouth 3 (three) times daily with meals.   90 capsule   3   . doxycycline (VIBRAMYCIN) 100 MG capsule   Oral   Take 1 capsule (100 mg total) by mouth 2 (two) times daily.   20 capsule   0   . insulin aspart (NOVOLOG) 100 UNIT/ML injection   Subcutaneous   Inject 0-15 Units into the skin 3 (three) times daily with meals.   1 vial   0   . Insulin Pen Needle 31G X 8 MM MISC      Use to inject sliding scale insulin three times daily before meals.   100 each   0   . mupirocin ointment (BACTROBAN) 2 %   Topical   Apply topically 3 (three) times daily.   22 g   0   . Tamsulosin HCl (FLOMAX) 0.4 MG CAPS   Oral   Take 0.4 mg by mouth daily.         Marland Kitchen triamcinolone ointment (KENALOG) 0.5 %   Topical   Apply topically 2 (two) times daily.   30 g   0   .  Vitamin D, Ergocalciferol, (DRISDOL) 50000 UNITS CAPS      take 1 capsule by mouth every week   5 capsule   0     BP 115/71  Pulse 92  Temp(Src) 97.5 F (36.4 C) (Oral)  Resp 18  SpO2 94%  Physical Exam  Nursing note and vitals reviewed. Constitutional: He is oriented to person, place, and time.  Morbidly obese  Eyes: Conjunctivae are normal.  Cardiovascular: Normal heart sounds.   Pulmonary/Chest: He has no wheezes.  Coarse breath sounds, no respiratory distress no rales or wheezing.   Neurological: He is alert and oriented to person, place, and time.  Skin:  There are scattered insect bite like lesions in both arms there are few lesions with excoriations and pustules on top. There is a small abscess in left lateral forearm that has drained spontaneously. No areas with tenderness, erythema induration or fluctuations.    ED Course  Procedures (including critical care time)  Labs Reviewed - No data to display No results  found.   1. Infected insect bites of multiple sites       MDM  Treated with doxycycline, mupirocin and triamcinolone ointment. Recommended insect repellent for outdoor activities. Supportive care and red flags that should prompt his return to medical attention discussed with patient and provided in writing.        Randa Spike, MD 03/16/13 0221

## 2013-03-14 NOTE — ED Notes (Signed)
Pt c/o multiple abscess on left arm onset 1 week... Reports getting new tattoos and believes it maybe infected sxs include: pain, pus drainage  Denies: fevers... He is alert and oriented w/no signs of acute distress.

## 2013-08-19 ENCOUNTER — Encounter (HOSPITAL_COMMUNITY): Payer: Self-pay | Admitting: Emergency Medicine

## 2013-08-19 ENCOUNTER — Inpatient Hospital Stay (HOSPITAL_COMMUNITY)
Admission: EM | Admit: 2013-08-19 | Discharge: 2013-08-28 | DRG: 003 | Disposition: A | Discharge status: Other Acute Inpatient Hospital | Payer: Medicare Other | Attending: Internal Medicine | Admitting: Internal Medicine

## 2013-08-19 ENCOUNTER — Emergency Department (HOSPITAL_COMMUNITY): Payer: Medicare Other

## 2013-08-19 DIAGNOSIS — E785 Hyperlipidemia, unspecified: Secondary | ICD-10-CM

## 2013-08-19 DIAGNOSIS — J9620 Acute and chronic respiratory failure, unspecified whether with hypoxia or hypercapnia: Principal | ICD-10-CM

## 2013-08-19 DIAGNOSIS — E871 Hypo-osmolality and hyponatremia: Secondary | ICD-10-CM | POA: Diagnosis present

## 2013-08-19 DIAGNOSIS — E8729 Other acidosis: Secondary | ICD-10-CM

## 2013-08-19 DIAGNOSIS — T884XXA Failed or difficult intubation, initial encounter: Secondary | ICD-10-CM

## 2013-08-19 DIAGNOSIS — R61 Generalized hyperhidrosis: Secondary | ICD-10-CM | POA: Diagnosis present

## 2013-08-19 DIAGNOSIS — Z794 Long term (current) use of insulin: Secondary | ICD-10-CM

## 2013-08-19 DIAGNOSIS — T884XXS Failed or difficult intubation, sequela: Secondary | ICD-10-CM

## 2013-08-19 DIAGNOSIS — N186 End stage renal disease: Secondary | ICD-10-CM

## 2013-08-19 DIAGNOSIS — R0989 Other specified symptoms and signs involving the circulatory and respiratory systems: Secondary | ICD-10-CM | POA: Diagnosis not present

## 2013-08-19 DIAGNOSIS — I498 Other specified cardiac arrhythmias: Secondary | ICD-10-CM | POA: Diagnosis not present

## 2013-08-19 DIAGNOSIS — E872 Acidosis, unspecified: Secondary | ICD-10-CM | POA: Diagnosis present

## 2013-08-19 DIAGNOSIS — Y832 Surgical operation with anastomosis, bypass or graft as the cause of abnormal reaction of the patient, or of later complication, without mention of misadventure at the time of the procedure: Secondary | ICD-10-CM | POA: Diagnosis not present

## 2013-08-19 DIAGNOSIS — F1210 Cannabis abuse, uncomplicated: Secondary | ICD-10-CM | POA: Diagnosis present

## 2013-08-19 DIAGNOSIS — Z6841 Body Mass Index (BMI) 40.0 and over, adult: Secondary | ICD-10-CM

## 2013-08-19 DIAGNOSIS — I120 Hypertensive chronic kidney disease with stage 5 chronic kidney disease or end stage renal disease: Secondary | ICD-10-CM | POA: Diagnosis present

## 2013-08-19 DIAGNOSIS — R0602 Shortness of breath: Secondary | ICD-10-CM | POA: Diagnosis not present

## 2013-08-19 DIAGNOSIS — D649 Anemia, unspecified: Secondary | ICD-10-CM

## 2013-08-19 DIAGNOSIS — N2581 Secondary hyperparathyroidism of renal origin: Secondary | ICD-10-CM | POA: Diagnosis present

## 2013-08-19 DIAGNOSIS — N390 Urinary tract infection, site not specified: Secondary | ICD-10-CM | POA: Diagnosis present

## 2013-08-19 DIAGNOSIS — G9340 Encephalopathy, unspecified: Secondary | ICD-10-CM

## 2013-08-19 DIAGNOSIS — J189 Pneumonia, unspecified organism: Secondary | ICD-10-CM | POA: Diagnosis present

## 2013-08-19 DIAGNOSIS — G4733 Obstructive sleep apnea (adult) (pediatric): Secondary | ICD-10-CM | POA: Diagnosis present

## 2013-08-19 DIAGNOSIS — J9600 Acute respiratory failure, unspecified whether with hypoxia or hypercapnia: Secondary | ICD-10-CM

## 2013-08-19 DIAGNOSIS — E875 Hyperkalemia: Secondary | ICD-10-CM | POA: Diagnosis present

## 2013-08-19 DIAGNOSIS — I509 Heart failure, unspecified: Secondary | ICD-10-CM | POA: Diagnosis present

## 2013-08-19 DIAGNOSIS — J209 Acute bronchitis, unspecified: Secondary | ICD-10-CM | POA: Diagnosis not present

## 2013-08-19 DIAGNOSIS — Z7982 Long term (current) use of aspirin: Secondary | ICD-10-CM

## 2013-08-19 DIAGNOSIS — E662 Morbid (severe) obesity with alveolar hypoventilation: Secondary | ICD-10-CM | POA: Diagnosis present

## 2013-08-19 DIAGNOSIS — E119 Type 2 diabetes mellitus without complications: Secondary | ICD-10-CM | POA: Diagnosis not present

## 2013-08-19 DIAGNOSIS — T82898A Other specified complication of vascular prosthetic devices, implants and grafts, initial encounter: Secondary | ICD-10-CM | POA: Diagnosis not present

## 2013-08-19 DIAGNOSIS — E46 Unspecified protein-calorie malnutrition: Secondary | ICD-10-CM

## 2013-08-19 DIAGNOSIS — I10 Essential (primary) hypertension: Secondary | ICD-10-CM

## 2013-08-19 DIAGNOSIS — T884XXD Failed or difficult intubation, subsequent encounter: Secondary | ICD-10-CM

## 2013-08-19 DIAGNOSIS — D631 Anemia in chronic kidney disease: Secondary | ICD-10-CM | POA: Diagnosis present

## 2013-08-19 DIAGNOSIS — Z833 Family history of diabetes mellitus: Secondary | ICD-10-CM

## 2013-08-19 DIAGNOSIS — IMO0002 Reserved for concepts with insufficient information to code with codable children: Secondary | ICD-10-CM | POA: Diagnosis not present

## 2013-08-19 DIAGNOSIS — R1310 Dysphagia, unspecified: Secondary | ICD-10-CM | POA: Diagnosis not present

## 2013-08-19 DIAGNOSIS — I5033 Acute on chronic diastolic (congestive) heart failure: Secondary | ICD-10-CM | POA: Diagnosis present

## 2013-08-19 DIAGNOSIS — J811 Chronic pulmonary edema: Secondary | ICD-10-CM

## 2013-08-19 DIAGNOSIS — Z79899 Other long term (current) drug therapy: Secondary | ICD-10-CM

## 2013-08-19 DIAGNOSIS — Y9210 Unspecified residential institution as the place of occurrence of the external cause: Secondary | ICD-10-CM | POA: Diagnosis not present

## 2013-08-19 DIAGNOSIS — Z992 Dependence on renal dialysis: Secondary | ICD-10-CM

## 2013-08-19 DIAGNOSIS — G9349 Other encephalopathy: Secondary | ICD-10-CM | POA: Diagnosis present

## 2013-08-19 LAB — POCT I-STAT 3, ART BLOOD GAS (G3+)
TCO2: 30 mmol/L (ref 0–100)
pCO2 arterial: 61.5 mmHg (ref 35.0–45.0)
pH, Arterial: 7.261 — ABNORMAL LOW (ref 7.350–7.450)
pO2, Arterial: 77 mmHg — ABNORMAL LOW (ref 80.0–100.0)

## 2013-08-19 LAB — URINALYSIS, ROUTINE W REFLEX MICROSCOPIC
Glucose, UA: NEGATIVE mg/dL
Specific Gravity, Urine: >1.03 — ABNORMAL HIGH (ref 1.005–1.030)
Urobilinogen, UA: 0.2 mg/dL (ref 0.0–1.0)
pH: 5 (ref 5.0–8.0)

## 2013-08-19 LAB — COMPREHENSIVE METABOLIC PANEL
AST: 17 U/L (ref 0–37)
Albumin: 4.1 g/dL (ref 3.5–5.2)
Chloride: 86 mEq/L — ABNORMAL LOW (ref 96–112)
Creatinine, Ser: 10.88 mg/dL — ABNORMAL HIGH (ref 0.50–1.35)
Potassium: 5.4 mEq/L — ABNORMAL HIGH (ref 3.5–5.1)
Total Bilirubin: 0.3 mg/dL (ref 0.3–1.2)
Total Protein: 9.7 g/dL — ABNORMAL HIGH (ref 6.0–8.3)

## 2013-08-19 LAB — CBC
HCT: 34.9 % — ABNORMAL LOW (ref 39.0–52.0)
Hemoglobin: 11.5 g/dL — ABNORMAL LOW (ref 13.0–17.0)
MCH: 31.1 pg (ref 26.0–34.0)
Platelets: 238 10*3/uL (ref 150–400)
RBC: 3.7 MIL/uL — ABNORMAL LOW (ref 4.22–5.81)

## 2013-08-19 LAB — CG4 I-STAT (LACTIC ACID): Lactic Acid, Venous: 1 mmol/L (ref 0.5–2.2)

## 2013-08-19 LAB — URINE MICROSCOPIC-ADD ON

## 2013-08-19 LAB — LACTIC ACID, PLASMA: Lactic Acid, Venous: 0.7 mmol/L (ref 0.5–2.2)

## 2013-08-19 MED ORDER — VANCOMYCIN HCL 1000 MG IV SOLR
1250.0000 mg | INTRAVENOUS | Status: DC
  Filled 2013-08-19: qty 1250

## 2013-08-19 MED ORDER — VANCOMYCIN HCL 10 G IV SOLR
2500.0000 mg | Freq: Once | INTRAVENOUS | Status: AC
  Administered 2013-08-19: 2500 mg via INTRAVENOUS
  Filled 2013-08-19: qty 2500

## 2013-08-19 MED ORDER — ACETAMINOPHEN 325 MG PO TABS
650.0000 mg | ORAL_TABLET | Freq: Once | ORAL | Status: AC
  Administered 2013-08-19: 650 mg via ORAL
  Filled 2013-08-19: qty 2

## 2013-08-19 MED ORDER — PIPERACILLIN-TAZOBACTAM 3.375 G IVPB 30 MIN
3.3750 g | Freq: Once | INTRAVENOUS | Status: AC
  Administered 2013-08-20: 3.375 g via INTRAVENOUS
  Filled 2013-08-19: qty 50

## 2013-08-19 NOTE — ED Notes (Signed)
EDP into room, sleeping with sonorous resps, arousable to light touch and voice with multiple attempts and coaching, pt with some confusion, sleepy, poor concentration, diaphoretic.

## 2013-08-19 NOTE — ED Notes (Signed)
RT at [facility], pt tolerating Bipap.

## 2013-08-19 NOTE — ED Notes (Signed)
Pt talking about going home, making a case for going home, Dr. Lake Bells discussing risks of leaving and not being admitted, including worsening & death.

## 2013-08-19 NOTE — Consult Note (Signed)
PULMONARY  / CRITICAL CARE MEDICINE  Name: Seth [name] MRN: [id number] DOB: 1966/02/05    ADMISSION DATE:  08/19/2013 CONSULTATION DATE:  08/19/2013  REFERRING MD :  TRH/EDP PRIMARY SERVICE: TRH  CHIEF COMPLAINT:  Cough, congestion  BRIEF PATIENT DESCRIPTION: 47 y/o male with OSA, ESRD presented to the [facility] ED on 11/10 with fever, cough, and chest congestion.  In the ED he developed hypercapnic respiratory failure requiring BIPAP briefly.  PCCM asked to evaluate for ICU.  SIGNIFICANT EVENTS / STUDIES:    LINES / TUBES:   CULTURES: 11/10 blood >>  ANTIBIOTICS: 11/10 vanc >> 11/10 zosyn >>  HISTORY OF PRESENT ILLNESS:  47 y/o male with OSA, ESRD presented to the [facility] ED on 11/10 with fever, cough, and chest congestion.  In the ED he developed hypercapnic respiratory failure requiring BIPAP briefly.  PCCM asked to evaluate for ICU admission.  He notes that he caught a cold from a colleague several days ago at work and has had fever, cough and chest congestion.  He has had a lot of mucus build up in his throat but denies shortness of breath or a sore throat.  He says that his voice has changed mostly because of all the mucus in his throat.  He denies chest pain, headache.  In the ED he was noted to be hypercapnic so BIPAP was started.  His mental status improved greatly within minutes.  He actually removed the BIPAP machine himself.  He notes that he recently started CPAP 2 weeks ago and has been using it regularly since.  He went to dialysis on 11/10.    PAST MEDICAL HISTORY :  Past Medical History  Diagnosis Date  . Diabetes mellitus   . Hypertension   . Chronic kidney disease   . 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 Av fistula placement  05/03/2012    Procedure: ARTERIOVENOUS (AV) FISTULA CREATION;  Surgeon: Mal [name], MD;  Location: [facility];  Service: Vascular;   Laterality: Right;   Prior to Admission medications   Medication Sig Start Date End Date Taking? Authorizing Provider  amLODipine (NORVASC) 10 MG tablet Take 10 mg by mouth daily.   Yes Historical Provider, MD  aspirin EC 81 MG tablet Take 81 mg by mouth daily.   Yes Historical Provider, MD  insulin aspart (NOVOLOG) 100 UNIT/ML injection Inject 0-15 Units into the skin 3 (three) times daily with meals. 05/05/12  Yes [name], DO  Tamsulosin HCl (FLOMAX) 0.4 MG CAPS Take 0.4 mg by mouth daily.   Yes Historical Provider, MD  Vitamin D, Ergocalciferol, (DRISDOL) 50000 UNITS CAPS take 1 capsule by mouth every week 06/07/12  Yes [name], MD  Insulin Pen Needle 31G X 8 MM MISC Use to inject sliding scale insulin three times daily before meals. 05/05/12   [name], DO   No Known Allergies  FAMILY HISTORY:  Family History  Problem Relation Age of Onset  . Diabetes Mother   . Diabetes Sister   . Diabetes Brother    SOCIAL HISTORY:  reports that he has never smoked. He has never used smokeless tobacco. He reports that he drinks about 0.6 ounces of alcohol per week. He reports that he uses illicit drugs (Marijuana).  REVIEW OF SYSTEMS:   Gen: + fever, + chills, weight change, + fatigue, night sweats  HEENT: Denies blurred vision, double vision, hearing loss, tinnitus, sinus congestion, rhinorrhea, sore throat, neck stiffness, dysphagia PULM: Denies shortness of breath, + cough, + sputum production, hemoptysis, wheezing CV: Denies chest pain, edema, orthopnea, paroxysmal nocturnal dyspnea, palpitations GI: Denies abdominal pain, nausea, vomiting, diarrhea, hematochezia, melena, constipation, change in bowel habits GU: Denies dysuria, hematuria, polyuria, oliguria, urethral discharge Endocrine: Denies hot or cold intolerance, polyuria, polyphagia or appetite change Derm: Denies rash, dry skin, scaling or peeling skin change Heme: Denies easy bruising, bleeding,  bleeding gums Neuro: Denies headache, numbness, weakness, slurred speech, loss of memory or consciousness   SUBJECTIVE:   VITAL SIGNS: Temp:  [97.8 F (36.6 C)-100.4 F (38 C)] 100.4 F (38 C) (11/10 2048) Pulse Rate:  [75-103] 98 (11/10 2224) Resp:  [21-31] 21 (11/10 2224) BP: (106-131)/(56-90) 123/81 mmHg (11/10 2130) SpO2:  [79 %-99 %] 97 % (11/10 2224) Weight:  [153.854 kg (339 lb 3 oz)] 153.854 kg (339 lb 3 oz) (11/10 1848) HEMODYNAMICS:   VENTILATOR SETTINGS:   INTAKE / OUTPUT: Intake/Output   None     PHYSICAL EXAMINATION:  Gen: comfortable off of bipap HEENT: NCAT, PERRL EOMI, OP clear  PULM: distant breath sounds, few scattered wheezes CV: RRR, distant heart sounds AB: BS+, soft, nontender Ext: warm, trace edema Derm: multiple tattoos, no rash Neuro: A&Ox4, maew  LABS:  CBC  Recent Labs Lab 08/19/13 1901  WBC 12.5*  HGB 11.5*  HCT 34.9*  PLT 238   Coag's No results found for this basename: APTT, INR,  in the last 168 hours BMET  Recent Labs Lab 08/19/13 1901  NA 130*  K 5.4*  CL 86*  CO2 25  BUN 55*  CREATININE 10.88*  GLUCOSE 248*   Electrolytes  Recent Labs Lab 08/19/13 1901  CALCIUM 8.8   Sepsis Markers  Recent Labs Lab 08/19/13 2312  LATICACIDVEN 1.00   ABG  Recent Labs Lab 08/19/13 2154  PHART 7.261*  PCO2ART 61.5*  PO2ART 77.0*   Liver Enzymes  Recent Labs Lab 08/19/13 1901  AST 17  ALT 19  ALKPHOS 61  BILITOT 0.3  ALBUMIN 4.1   Cardiac Enzymes  Recent Labs Lab 08/19/13 2012  PROBNP 5311.0*   Glucose  Recent Labs Lab 08/19/13 1901  GLUCAP 219*    Imaging Dg Chest 2 View  08/19/2013   CLINICAL DATA:  Shortness of breath.  EXAM: CHEST  2 VIEW  COMPARISON:  05/01/2012  FINDINGS: Cardiomegaly with vascular congestion. Diffuse interstitial prominence could reflect early interstitial edema. No confluent opacities or effusions. No acute bony abnormality.  IMPRESSION: Cardiomegaly with vascular  congestion and possible or interstitial edema.   Electronically Signed   By: Rolm Baptise M.D.   On: 08/19/2013 20:34     CXR: Cardiomegally, interstitial edema  ASSESSMENT / PLAN:  PULMONARY A: Chronic hypercapnic respiratory failure> Most likely due to underlying untreated obesity hypoventilation syndrome.  Currently breathing comfortably and does not appear to have acute decompensation.  Likely has some pulmonary edema and with cough/bronchitis suspect all this is somehow contributing to the slight worsening in his hypercapnea.    Baseline CO2 likely around 50 based on ABG. Acute tracheobronchitis P:   -would use BIPAP prn and qHS and repeat an ABG in the morning -will need BIPAP titration study as an outpatient, would request that he see a sleep specialist after discharge -see ID -mucinex prn -albuterol neb prn  CARDIOVASCULAR A: pulmonary edema likely due to volume overload from ESRD, consider diastolic CHF P:  -  would discuss volume status with renal and dialyze as needed  RENAL A:  ESRD P:   -as above  INFECTIOUS A:  Acute tracheobronchitis P:   -test for flu -considering severity of illness, would treat with antibiotics such as doxycycline 100mg  po bid   NEUROLOGIC A:  Acute encephalopathy in setting of hypercapnea, resolved P:   -BIPAP again overnight   PCCM will see again in morning 11/11 to f/u repeat ABG and provide final recs   TODAY'S SUMMARY: I agree that he should be admitted to step down for observation overnight, treat with antibiotics for tracheobronchitis, and discuss need for more HD with renal prior to discharge.  Needs outpatient sleep evaluation for BIPAP rather than CPAP for obesity hypoventilation syndrome.  Lemmie [facility] Pager: [phone]  08/19/2013, 11:15 PM

## 2013-08-19 NOTE — ED Notes (Signed)
Gave pt. Urinal to use when he got ready so that we could collect urine specimen

## 2013-08-19 NOTE — ED Notes (Signed)
Dr. Fayne Mediate PCCM at [facility], pt took Bipap off of himself, RT called to [facility], upper airway noises continue, congested speech. abx infusing.

## 2013-08-19 NOTE — ED Notes (Signed)
Pt sleeping/ arousable to voice, SPO2 dropping with sonorous resps, mentions having a new CPAP at home (new to CPAP for OSA), head covered with sweat droplets, temp noted to be high, HR 98-106 NSR, [place] O2 placed on pt.

## 2013-08-19 NOTE — ED Notes (Signed)
Pt went to dialysis today, but states that he has been feeling bad and reports bilateral hand tremors, was told his speech does not sound same.  Pt sounds sob, pt states his brother noticed change in his speech a couple of weeks ago.  No pain, no facial droop

## 2013-08-19 NOTE — ED Notes (Signed)
Dr. Hal Hope at [facility], speaking with pt about admission, pt talking about leaving and going home. Pt trialed off of Bipap per RT & PCCM.

## 2013-08-19 NOTE — ED Notes (Signed)
Pt back to room from triage, back via w/c, self transferred from w/c to stretcher, steady gait, obese, NAD, calm, transporter here to take pt to xray.

## 2013-08-19 NOTE — ED Provider Notes (Signed)
CSN: [id number]     Arrival date & time 08/19/13  1832 History   First MD Initiated Contact with Patient 08/19/13 2000     Chief Complaint  Patient presents with  . Shortness of Breath  . speech not right    (Consider location/radiation/quality/duration/timing/severity/associated sxs/prior Treatment) HPI Comments: 47 yo male with DM, ESRD, dialysis today presents with feeling generally unwell and sob since today.  Family noticed thick speech the past two weeks, pt new CPAP at home.  Fever new today with sweating.  No cp.  Mild sob similar to previous.  No ha.  Pt produces urine.  Sxs worse with lying flat.    The history is provided by the patient.    Past Medical History  Diagnosis Date  . Diabetes mellitus   . Hypertension   . Chronic kidney disease   . 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 Av fistula placement  05/03/2012    Procedure: ARTERIOVENOUS (AV) FISTULA CREATION;  Surgeon: Mal [name], MD;  Location: [facility] OR;  Service: Vascular;  Laterality: Right;   Family History  Problem Relation Age of Onset  . Diabetes Mother   . Diabetes Sister   . Diabetes Brother    History  Substance Use Topics  . Smoking status: Never Smoker   . Smokeless tobacco: Never Used  . Alcohol Use: 0.6 oz/week    1 Cans of beer per week    Review of Systems  Constitutional: Positive for fever, appetite change and fatigue. Negative for chills.  HENT: Negative for congestion.   Eyes: Negative for visual disturbance.  Respiratory: Positive for cough and shortness of breath.   Cardiovascular: Negative for chest pain.  Gastrointestinal: Negative for vomiting and abdominal pain.  Genitourinary: Positive for difficulty urinating.  Musculoskeletal: Negative for back pain.  Skin: Negative for rash.  Neurological: Positive for light-headedness. Negative for headaches.    Allergies  Review  of patient's allergies indicates no known allergies.  Home Medications   Current Outpatient Rx  Name  Route  Sig  Dispense  Refill  . amLODipine (NORVASC) 10 MG tablet   Oral   Take 10 mg by mouth daily.         Marland Kitchen aspirin EC 81 MG tablet   Oral   Take 81 mg by mouth daily.         . insulin aspart (NOVOLOG) 100 UNIT/ML injection   Subcutaneous   Inject 0-15 Units into the skin 3 (three) times daily with meals.         . Tamsulosin HCl (FLOMAX) 0.4 MG CAPS   Oral   Take 0.4 mg by mouth daily.         . Vitamin D, Ergocalciferol, (DRISDOL) 50000 UNITS CAPS      take 1 capsule by mouth every week   5 capsule   0   . Insulin Pen Needle 31G X 8 MM MISC      Use to inject sliding scale insulin three times daily before meals.   100 each   0    BP 124/56  Pulse 75  Temp(Src) 100.4 F (38 C) (Oral)  Resp 21  Wt 339 lb 3 oz (153.854 kg)  SpO2 94% Physical Exam  Nursing note and vitals reviewed. Constitutional: He is oriented to person, place, and time. He appears well-developed and well-nourished.  HENT:  Head: Normocephalic and atraumatic.  Thick neck No posterior pharyngeal edema or exudate  Eyes: Conjunctivae are normal. Right eye exhibits no discharge. Left eye exhibits no discharge.  Neck: Normal range of motion. Neck supple. No tracheal deviation present.  Cardiovascular: Normal rate and regular rhythm.   Pulmonary/Chest: Effort normal. He has rales (few at bases bilateral).  Abdominal: Soft. He exhibits no distension. There is no tenderness. There is no guarding.  Musculoskeletal: He exhibits edema (mild bilateral LE).  Neurological: He is alert and oriented to person, place, and time. He has normal strength. No cranial nerve deficit. GCS eye subscore is 4. GCS verbal subscore is 5. GCS motor subscore is 6.  Sleepy but arousable  Skin: Skin is warm.  diaphoresis  Psychiatric: He has a normal mood and affect.    ED Course  Procedures (including  critical care time) Labs Review Labs Reviewed  CBC - Abnormal; Notable for the following:    WBC 12.5 (*)    RBC 3.70 (*)    Hemoglobin 11.5 (*)    HCT 34.9 (*)    All other components within normal limits  COMPREHENSIVE METABOLIC PANEL - Abnormal; Notable for the following:    Sodium 130 (*)    Potassium 5.4 (*)    Chloride 86 (*)    Glucose, Bld 248 (*)    BUN 55 (*)    Creatinine, Ser 10.88 (*)    Total Protein 9.7 (*)    GFR calc non Af Amer 5 (*)    GFR calc Af Amer 6 (*)    All other components within normal limits  GLUCOSE, CAPILLARY - Abnormal; Notable for the following:    Glucose-Capillary 219 (*)    All other components within normal limits  PRO B NATRIURETIC PEPTIDE - Abnormal; Notable for the following:    Pro B Natriuretic peptide (BNP) 5311.0 (*)    All other components within normal limits  URINALYSIS, ROUTINE W REFLEX MICROSCOPIC - Abnormal; Notable for the following:    Color, Urine AMBER (*)    APPearance TURBID (*)    Specific Gravity, Urine >1.030 (*)    Hgb urine dipstick LARGE (*)    Bilirubin Urine SMALL (*)    Ketones, ur 15 (*)    Protein, ur 100 (*)    Nitrite POSITIVE (*)    Leukocytes, UA SMALL (*)    All other components within normal limits  URINE MICROSCOPIC-ADD ON - Abnormal; Notable for the following:    Squamous Epithelial / LPF MANY (*)    Bacteria, UA MANY (*)    Casts HYALINE CASTS (*)    All other components within normal limits  POCT I-STAT 3, BLOOD GAS (G3+) - Abnormal; Notable for the following:    pH, Arterial 7.261 (*)    pCO2 arterial 61.5 (*)    pO2, Arterial 77.0 (*)    Bicarbonate 27.7 (*)    All other components within normal limits  CULTURE, BLOOD (ROUTINE X 2)  CULTURE, BLOOD (ROUTINE X 2)  URINE CULTURE  LACTIC ACID, PLASMA  BLOOD GAS, ARTERIAL  CG4 I-STAT (LACTIC ACID)   Imaging Review Dg Chest 2 View  08/19/2013   CLINICAL DATA:  Shortness of breath.  EXAM: CHEST  2 VIEW  COMPARISON:  05/01/2012  FINDINGS:  Cardiomegaly with vascular congestion. Diffuse interstitial prominence could reflect early interstitial edema. No confluent opacities or effusions. No acute bony abnormality.  IMPRESSION: Cardiomegaly with vascular congestion and possible or interstitial edema.   Electronically  Signed   By: Rolm Baptise M.D.   On: 08/19/2013 20:34    EKG Interpretation   None       MDM   1. Acute-on-chronic respiratory failure   2. Acute encephalopathy   3. Acute tracheobronchitis   4. End stage renal disease   5. Pulmonary edema    Pt in mid 80s, no O2 at home.  Combination of mild pulm edema, possible pneumonia and sleep apnea features. Fever in ED. Tylenol given.  Pt sleepy but arousable, abg ordered.  Non focal neuro exam.   With mild lethargy ABG to look for hypercarbia/ acidosis. BIPAP trial ordered.  Pt on [place], stable in ED.  Rechecked, no acute changes, responds to loud verbal discussion.   ICU rec step down.  TRIAD admitting.  Empiric abx for SIRS.  Filed Vitals:   08/19/13 2224 08/19/13 2345 08/19/13 2346 08/20/13 0033  BP:  138/93 138/93   Pulse: 98 90 90 90  Temp:      TempSrc:      Resp: 21 19  24   Height: 5\' 7"  (1.702 m)     Weight:      SpO2: 97% 100%  98%   The patients results and plan were reviewed and discussed.   Any x-rays performed were personally reviewed by myself.   Differential diagnosis were considered with the presenting HPI.  Diagnosis: Acute respiratory failure, Pulm edema, Acute resp acidosis, ESRD  EKG: reviewed  Admission/ observation were discussed with the admitting physician, patient and/or family and they are comfortable with the plan.    Mariea Clonts, MD 08/20/13 [phone]

## 2013-08-19 NOTE — ED Notes (Addendum)
Denies CP or sob, denies other sx. States, "had HD today, not sure if I had a full tx or not", some increased wob with exertion against body habitus while repositioning. Pt to xray.

## 2013-08-19 NOTE — ED Notes (Signed)
I stat lactic acid results given to Dr. Zavitz by B. Junice Fei, EMT 

## 2013-08-19 NOTE — Progress Notes (Addendum)
ANTIBIOTIC CONSULT NOTE - INITIAL  Pharmacy Consult for vancomycin and cefepime Indication: rule out sepsis  No Known Allergies  Patient Measurements: Height: 5\' 7"  (170.2 cm) Weight: 339 lb 3 oz (153.854 kg) IBW/kg (Calculated) : 66.1  Vital Signs: Temp: 100.4 F (38 C) (11/10 2048) Temp src: Oral (11/10 2048) BP: 123/81 mmHg (11/10 2130) Pulse Rate: 98 (11/10 2224)  Labs:  Recent Labs  08/19/13 1901  WBC 12.5*  HGB 11.5*  PLT 238  CREATININE 10.88*   Estimated Creatinine Clearance: 11.8 ml/min (by C-G formula based on Cr of 10.88).   Microbiology: No results found for this or any previous visit (from the past 720 hour(s)).  Medical History: Past Medical History  Diagnosis Date  . Diabetes mellitus   . Hypertension   . Chronic kidney disease   . RETINAL DETACHMENT, HX OF 06/20/2007    Qualifier: Diagnosis of  By: Vinetta Bergamo RN, [name]      Assessment: 47yo male c/o "feeling bad" with bilateral hand tremors, had HD today but unsure if he rec'd full tx, concern for sepsis, to begin IV ABX.  Goal of Therapy:  Pre-HD vanc level 15-25  Plan:  Will give vancomycin 2500mg  IV x1 then 1250mg  IV after each HD as well as cefepime 2g now and after each HD and monitor CBC, Cx, levels prn.  Wynona Neat, PharmD, BCPS  08/19/2013,10:28 PM

## 2013-08-19 NOTE — ED Notes (Signed)
BC x2 obtained, 1st by PBT, 2nd by RN. vanc received from pharmacy, pt tolerating Bipap.

## 2013-08-20 ENCOUNTER — Inpatient Hospital Stay (HOSPITAL_COMMUNITY): Payer: Medicare Other | Admitting: Anesthesiology

## 2013-08-20 ENCOUNTER — Inpatient Hospital Stay (HOSPITAL_COMMUNITY): Payer: Medicare Other

## 2013-08-20 ENCOUNTER — Encounter (HOSPITAL_COMMUNITY): Payer: Self-pay | Admitting: Internal Medicine

## 2013-08-20 ENCOUNTER — Encounter (HOSPITAL_COMMUNITY)
Admission: EM | Disposition: A | Discharge status: Nursing Home (Includes ICF) | Payer: Self-pay | Source: Home / Self Care | Attending: Internal Medicine

## 2013-08-20 ENCOUNTER — Encounter (HOSPITAL_COMMUNITY): Payer: Medicare Other | Admitting: Anesthesiology

## 2013-08-20 DIAGNOSIS — E872 Acidosis: Secondary | ICD-10-CM

## 2013-08-20 DIAGNOSIS — I5033 Acute on chronic diastolic (congestive) heart failure: Secondary | ICD-10-CM | POA: Diagnosis not present

## 2013-08-20 DIAGNOSIS — E119 Type 2 diabetes mellitus without complications: Secondary | ICD-10-CM

## 2013-08-20 DIAGNOSIS — J189 Pneumonia, unspecified organism: Secondary | ICD-10-CM | POA: Diagnosis not present

## 2013-08-20 DIAGNOSIS — G9349 Other encephalopathy: Secondary | ICD-10-CM | POA: Diagnosis not present

## 2013-08-20 DIAGNOSIS — T889XXS Complication of surgical and medical care, unspecified, sequela: Secondary | ICD-10-CM | POA: Diagnosis not present

## 2013-08-20 DIAGNOSIS — J9819 Other pulmonary collapse: Secondary | ICD-10-CM | POA: Diagnosis not present

## 2013-08-20 DIAGNOSIS — N186 End stage renal disease: Secondary | ICD-10-CM | POA: Diagnosis not present

## 2013-08-20 DIAGNOSIS — T888XXA Other specified complications of surgical and medical care, not elsewhere classified, initial encounter: Secondary | ICD-10-CM

## 2013-08-20 DIAGNOSIS — D631 Anemia in chronic kidney disease: Secondary | ICD-10-CM | POA: Diagnosis not present

## 2013-08-20 DIAGNOSIS — J9600 Acute respiratory failure, unspecified whether with hypoxia or hypercapnia: Secondary | ICD-10-CM

## 2013-08-20 DIAGNOSIS — J209 Acute bronchitis, unspecified: Secondary | ICD-10-CM | POA: Diagnosis not present

## 2013-08-20 DIAGNOSIS — T884XXA Failed or difficult intubation, initial encounter: Secondary | ICD-10-CM

## 2013-08-20 DIAGNOSIS — G9340 Encephalopathy, unspecified: Secondary | ICD-10-CM | POA: Diagnosis not present

## 2013-08-20 DIAGNOSIS — Z4682 Encounter for fitting and adjustment of non-vascular catheter: Secondary | ICD-10-CM | POA: Diagnosis not present

## 2013-08-20 DIAGNOSIS — J9620 Acute and chronic respiratory failure, unspecified whether with hypoxia or hypercapnia: Secondary | ICD-10-CM | POA: Diagnosis not present

## 2013-08-20 DIAGNOSIS — I120 Hypertensive chronic kidney disease with stage 5 chronic kidney disease or end stage renal disease: Secondary | ICD-10-CM | POA: Diagnosis not present

## 2013-08-20 HISTORY — PX: TRACHEOSTOMY TUBE PLACEMENT: SHX814

## 2013-08-20 LAB — GLUCOSE, CAPILLARY
Glucose-Capillary: 280 mg/dL — ABNORMAL HIGH (ref 70–99)
Glucose-Capillary: 250 mg/dL — ABNORMAL HIGH (ref 70–99)

## 2013-08-20 LAB — COMPREHENSIVE METABOLIC PANEL
ALT: 18 U/L (ref 0–53)
ALT: 18 U/L (ref 0–53)
AST: 20 U/L (ref 0–37)
Albumin: 3.8 g/dL (ref 3.5–5.2)
Albumin: 3.6 g/dL (ref 3.5–5.2)
Alkaline Phosphatase: 60 U/L (ref 39–117)
Alkaline Phosphatase: 54 U/L (ref 39–117)
CO2: 24 mEq/L (ref 19–32)
Calcium: 8.4 mg/dL (ref 8.4–10.5)
Chloride: 89 mEq/L — ABNORMAL LOW (ref 96–112)
GFR calc Af Amer: 5 mL/min — ABNORMAL LOW (ref >90)
GFR calc non Af Amer: 4 mL/min — ABNORMAL LOW (ref >90)
GFR calc non Af Amer: 4 mL/min — ABNORMAL LOW (ref >90)
Potassium: 6 mEq/L — ABNORMAL HIGH (ref 3.5–5.1)
Potassium: 7.3 mEq/L (ref 3.5–5.1)
Sodium: 130 mEq/L — ABNORMAL LOW (ref 135–145)
Sodium: 132 mEq/L — ABNORMAL LOW (ref 135–145)
Total Bilirubin: 0.4 mg/dL (ref 0.3–1.2)
Total Protein: 9.2 g/dL — ABNORMAL HIGH (ref 6.0–8.3)

## 2013-08-20 LAB — PHOSPHORUS
Phosphorus: 11.3 mg/dL — ABNORMAL HIGH (ref 2.3–4.6)
Phosphorus: 12.7 mg/dL — ABNORMAL HIGH (ref 2.3–4.6)

## 2013-08-20 LAB — POCT I-STAT 3, ART BLOOD GAS (G3+)
Bicarbonate: 28.8 mEq/L — ABNORMAL HIGH (ref 20.0–24.0)
Bicarbonate: 28.3 mEq/L — ABNORMAL HIGH (ref 20.0–24.0)
O2 Saturation: 96 %
TCO2: 31 mmol/L (ref 0–100)
TCO2: 30 mmol/L (ref 0–100)
pCO2 arterial: 65.6 mmHg (ref 35.0–45.0)
pCO2 arterial: 47.3 mmHg — ABNORMAL HIGH (ref 35.0–45.0)
pH, Arterial: 7.251 — ABNORMAL LOW (ref 7.350–7.450)
pH, Arterial: 7.387 (ref 7.350–7.450)
pO2, Arterial: 86 mmHg (ref 80.0–100.0)

## 2013-08-20 LAB — CBC
HCT: 33.1 % — ABNORMAL LOW (ref 39.0–52.0)
Platelets: 217 10*3/uL (ref 150–400)
RDW: 13.6 % (ref 11.5–15.5)
WBC: 12.2 10*3/uL — ABNORMAL HIGH (ref 4.0–10.5)

## 2013-08-20 LAB — BLOOD GAS, ARTERIAL
Acid-base deficit: 4.1 mmol/L — ABNORMAL HIGH (ref 0.0–2.0)
Bicarbonate: 25.3 mEq/L — ABNORMAL HIGH (ref 20.0–24.0)
FIO2: 0.4 %
Mode: POSITIVE
O2 Saturation: 93 %
TCO2: 28.3 mmol/L (ref 0–100)
pCO2 arterial: 95.4 mmHg (ref 35.0–45.0)
pH, Arterial: 7.053 — CL (ref 7.350–7.450)
pO2, Arterial: 86.9 mmHg (ref 80.0–100.0)

## 2013-08-20 LAB — CBC WITH DIFFERENTIAL/PLATELET
Basophils Absolute: 0 10*3/uL (ref 0.0–0.1)
Basophils Relative: 0 % (ref 0–1)
Eosinophils Relative: 1 % (ref 0–5)
HCT: 34.7 % — ABNORMAL LOW (ref 39.0–52.0)
Hemoglobin: 11.3 g/dL — ABNORMAL LOW (ref 13.0–17.0)
Lymphocytes Relative: 15 % (ref 12–46)
Lymphs Abs: 2 10*3/uL (ref 0.7–4.0)
MCHC: 32.6 g/dL (ref 30.0–36.0)
MCV: 97.5 fL (ref 78.0–100.0)
Monocytes Absolute: 1.4 10*3/uL — ABNORMAL HIGH (ref 0.1–1.0)
Monocytes Relative: 10 % (ref 3–12)
Neutro Abs: 9.8 10*3/uL — ABNORMAL HIGH (ref 1.7–7.7)
Platelets: 230 10*3/uL (ref 150–400)
RBC: 3.56 MIL/uL — ABNORMAL LOW (ref 4.22–5.81)
RDW: 13.7 % (ref 11.5–15.5)

## 2013-08-20 LAB — TROPONIN I: Troponin I: <0.3 ng/mL (ref <0.30)

## 2013-08-20 LAB — MRSA PCR SCREENING: MRSA by PCR: NEGATIVE

## 2013-08-20 SURGERY — CREATION, TRACHEOSTOMY
Anesthesia: General | Site: Neck | Wound class: Contaminated

## 2013-08-20 MED ORDER — SODIUM CHLORIDE 0.9 % IV SOLN: 1.0000 mg/h | INTRAVENOUS | Status: DC

## 2013-08-20 MED ORDER — HEPARIN SODIUM (PORCINE) 1000 UNIT/ML DIALYSIS
1000.0000 [IU] | INTRAMUSCULAR | Status: DC | PRN
  Filled 2013-08-20: qty 1

## 2013-08-20 MED ORDER — PENTAFLUOROPROP-TETRAFLUOROETH EX AERO: 1.0000 "application " | INHALATION_SPRAY | CUTANEOUS | Status: DC | PRN

## 2013-08-20 MED ORDER — ACETAMINOPHEN 325 MG PO TABS
650.0000 mg | ORAL_TABLET | Freq: Four times a day (QID) | ORAL | Status: DC | PRN
  Administered 2013-08-21: 650 mg via ORAL
  Filled 2013-08-20: qty 2

## 2013-08-20 MED ORDER — 0.9 % SODIUM CHLORIDE (POUR BTL) OPTIME
TOPICAL | Status: DC | PRN
  Administered 2013-08-20: 1000 mL

## 2013-08-20 MED ORDER — CEFEPIME HCL 2 G IJ SOLR
2.0000 g | INTRAMUSCULAR | Status: DC
  Administered 2013-08-20 – 2013-08-21 (×2): 2 g via INTRAVENOUS
  Filled 2013-08-20 (×2): qty 2

## 2013-08-20 MED ORDER — FENTANYL CITRATE 0.05 MG/ML IJ SOLN
100.0000 ug | Freq: Once | INTRAMUSCULAR | Status: AC
  Administered 2013-08-20: 100 ug via INTRAVENOUS

## 2013-08-20 MED ORDER — SODIUM CHLORIDE 0.9 % IV SOLN
INTRAVENOUS | Status: DC | PRN
  Administered 2013-08-20: 17:00:00 via INTRAVENOUS

## 2013-08-20 MED ORDER — INSULIN ASPART 100 UNIT/ML ~~LOC~~ SOLN
0.0000 [IU] | Freq: Three times a day (TID) | SUBCUTANEOUS | Status: DC
  Administered 2013-08-20: 3 [IU] via SUBCUTANEOUS
  Administered 2013-08-21: 5 [IU] via SUBCUTANEOUS
  Administered 2013-08-22: 1 [IU] via SUBCUTANEOUS
  Administered 2013-08-22: 3 [IU] via SUBCUTANEOUS
  Administered 2013-08-22: 1 [IU] via SUBCUTANEOUS
  Administered 2013-08-23: 2 [IU] via SUBCUTANEOUS
  Administered 2013-08-24: 1 [IU] via SUBCUTANEOUS
  Administered 2013-08-24: 2 [IU] via SUBCUTANEOUS
  Administered 2013-08-24 – 2013-08-25 (×2): 7 [IU] via SUBCUTANEOUS
  Administered 2013-08-25: 3 [IU] via SUBCUTANEOUS
  Administered 2013-08-25: 5 [IU] via SUBCUTANEOUS

## 2013-08-20 MED ORDER — LIDOCAINE-PRILOCAINE 2.5-2.5 % EX CREA: 1.0000 "application " | TOPICAL_CREAM | CUTANEOUS | Status: DC | PRN

## 2013-08-20 MED ORDER — ONDANSETRON HCL 4 MG PO TABS: 4.0000 mg | ORAL_TABLET | Freq: Four times a day (QID) | ORAL | Status: DC | PRN

## 2013-08-20 MED ORDER — SODIUM CHLORIDE 0.9 % IJ SOLN
3.0000 mL | Freq: Two times a day (BID) | INTRAMUSCULAR | Status: DC
  Administered 2013-08-20: 3 mL via INTRAVENOUS

## 2013-08-20 MED ORDER — DEXTROSE 5 % IV SOLN
2.0000 g | Freq: Once | INTRAVENOUS | Status: AC
  Administered 2013-08-20: 2 g via INTRAVENOUS
  Filled 2013-08-20: qty 2

## 2013-08-20 MED ORDER — SODIUM CHLORIDE 0.9 % IV SOLN: 100.0000 mL | INTRAVENOUS | Status: DC | PRN

## 2013-08-20 MED ORDER — FENTANYL CITRATE 0.05 MG/ML IJ SOLN: 50.0000 ug | Freq: Once | INTRAMUSCULAR | Status: DC

## 2013-08-20 MED ORDER — FENTANYL CITRATE 0.05 MG/ML IJ SOLN
INTRAMUSCULAR | Status: AC
  Administered 2013-08-20: 200 ug
  Filled 2013-08-20: qty 4

## 2013-08-20 MED ORDER — MIDAZOLAM HCL 2 MG/2ML IJ SOLN
INTRAMUSCULAR | Status: AC
  Administered 2013-08-20: 2 mg
  Filled 2013-08-20: qty 4

## 2013-08-20 MED ORDER — SODIUM BICARBONATE 8.4 % IV SOLN
100.0000 meq | Freq: Once | INTRAVENOUS | Status: AC
  Administered 2013-08-20: 50 meq via INTRAVENOUS

## 2013-08-20 MED ORDER — SODIUM POLYSTYRENE SULFONATE 15 GM/60ML PO SUSP
30.0000 g | Freq: Once | ORAL | Status: AC
  Filled 2013-08-20: qty 120

## 2013-08-20 MED ORDER — ROCURONIUM BROMIDE 100 MG/10ML IV SOLN
INTRAVENOUS | Status: DC | PRN
  Administered 2013-08-20: 50 mg via INTRAVENOUS

## 2013-08-20 MED ORDER — BIOTENE DRY MOUTH MT LIQD
15.0000 mL | Freq: Four times a day (QID) | OROMUCOSAL | Status: DC
  Administered 2013-08-21 – 2013-08-28 (×29): 15 mL via OROMUCOSAL

## 2013-08-20 MED ORDER — AMLODIPINE BESYLATE 10 MG PO TABS
10.0000 mg | ORAL_TABLET | Freq: Every day | ORAL | Status: DC
Start: 2013-08-20 — End: 2013-08-20
  Filled 2013-08-20: qty 1

## 2013-08-20 MED ORDER — ROCURONIUM BROMIDE 50 MG/5ML IV SOLN
50.0000 mg | Freq: Once | INTRAVENOUS | Status: AC
  Administered 2013-08-20: 50 mg via INTRAVENOUS
  Filled 2013-08-20: qty 5

## 2013-08-20 MED ORDER — LIDOCAINE-EPINEPHRINE 1 %-1:100000 IJ SOLN
INTRAMUSCULAR | Status: AC
  Filled 2013-08-20: qty 1

## 2013-08-20 MED ORDER — MIDAZOLAM HCL 2 MG/2ML IJ SOLN: 4.0000 mg | Freq: Once | INTRAMUSCULAR | Status: DC

## 2013-08-20 MED ORDER — FENTANYL CITRATE 0.05 MG/ML IJ SOLN
INTRAMUSCULAR | Status: AC
  Administered 2013-08-20: 100 ug
  Filled 2013-08-20: qty 4

## 2013-08-20 MED ORDER — CHLORHEXIDINE GLUCONATE 0.12 % MT SOLN
15.0000 mL | Freq: Two times a day (BID) | OROMUCOSAL | Status: DC
  Administered 2013-08-20 – 2013-08-28 (×15): 15 mL via OROMUCOSAL
  Filled 2013-08-20 (×17): qty 15

## 2013-08-20 MED ORDER — ATROPINE SULFATE 0.1 MG/ML IJ SOLN
INTRAMUSCULAR | Status: AC
  Filled 2013-08-20: qty 10

## 2013-08-20 MED ORDER — FENTANYL BOLUS VIA INFUSION
50.0000 ug | INTRAVENOUS | Status: DC | PRN
  Administered 2013-08-20: 100 ug via INTRAVENOUS
  Administered 2013-08-21: 50 ug via INTRAVENOUS
  Filled 2013-08-20: qty 100

## 2013-08-20 MED ORDER — HEPARIN SODIUM (PORCINE) 1000 UNIT/ML DIALYSIS
100.0000 [IU]/kg | INTRAMUSCULAR | Status: DC | PRN
  Administered 2013-08-21: 3100 [IU] via INTRAVENOUS_CENTRAL
  Filled 2013-08-20: qty 16

## 2013-08-20 MED ORDER — SODIUM CHLORIDE 0.9 % IV SOLN
0.0000 ug/h | INTRAVENOUS | Status: DC
  Administered 2013-08-20: 100 ug/h via INTRAVENOUS
  Filled 2013-08-20 (×2): qty 50

## 2013-08-20 MED ORDER — CALCIUM CHLORIDE 10 % IV SOLN
1.0000 g | Freq: Once | INTRAVENOUS | Status: AC
  Administered 2013-08-20: 1 g via INTRAVENOUS

## 2013-08-20 MED ORDER — ONDANSETRON HCL 4 MG/2ML IJ SOLN: 4.0000 mg | Freq: Four times a day (QID) | INTRAMUSCULAR | Status: DC | PRN

## 2013-08-20 MED ORDER — SODIUM CHLORIDE 0.9 % IV SOLN
0.0000 mg/h | INTRAVENOUS | Status: DC
  Administered 2013-08-20 (×2): 3 mg/h via INTRAVENOUS
  Filled 2013-08-20 (×2): qty 10

## 2013-08-20 MED ORDER — PHENYLEPHRINE HCL 10 MG/ML IJ SOLN
30.0000 ug/min | INTRAMUSCULAR | Status: DC
  Filled 2013-08-20: qty 2

## 2013-08-20 MED ORDER — TAMSULOSIN HCL 0.4 MG PO CAPS
0.4000 mg | ORAL_CAPSULE | Freq: Every day | ORAL | Status: DC
  Filled 2013-08-20: qty 1

## 2013-08-20 MED ORDER — VANCOMYCIN HCL IN DEXTROSE 1-5 GM/200ML-% IV SOLN
1000.0000 mg | Freq: Once | INTRAVENOUS | Status: AC
  Administered 2013-08-20: 1000 mg via INTRAVENOUS
  Filled 2013-08-20: qty 200

## 2013-08-20 MED ORDER — SODIUM CHLORIDE 0.9 % IJ SOLN
3.0000 mL | Freq: Two times a day (BID) | INTRAMUSCULAR | Status: DC
  Administered 2013-08-21 – 2013-08-22 (×3): 3 mL via INTRAVENOUS

## 2013-08-20 MED ORDER — SODIUM CHLORIDE 0.9 % IV SOLN: 25.0000 ug/h | INTRAVENOUS | Status: DC

## 2013-08-20 MED ORDER — LIDOCAINE HCL (PF) 1 % IJ SOLN: 5.0000 mL | INTRAMUSCULAR | Status: DC | PRN

## 2013-08-20 MED ORDER — ETOMIDATE 2 MG/ML IV SOLN
20.0000 mg | Freq: Once | INTRAVENOUS | Status: AC
  Administered 2013-08-20: 20 mg via INTRAVENOUS

## 2013-08-20 MED ORDER — SODIUM POLYSTYRENE SULFONATE 15 GM/60ML PO SUSP
30.0000 g | Freq: Once | ORAL | Status: DC
  Filled 2013-08-20: qty 120

## 2013-08-20 MED ORDER — PANTOPRAZOLE SODIUM 40 MG IV SOLR
40.0000 mg | INTRAVENOUS | Status: DC
  Administered 2013-08-20 – 2013-08-24 (×5): 40 mg via INTRAVENOUS
  Filled 2013-08-20 (×7): qty 40

## 2013-08-20 MED ORDER — ACETAMINOPHEN 650 MG RE SUPP
650.0000 mg | Freq: Four times a day (QID) | RECTAL | Status: DC | PRN
  Administered 2013-08-21: 650 mg via RECTAL
  Filled 2013-08-20: qty 1

## 2013-08-20 MED ORDER — SODIUM BICARBONATE 8.4 % IV SOLN
INTRAVENOUS | Status: DC
  Administered 2013-08-20: 13:00:00 via INTRAVENOUS
  Filled 2013-08-20 (×2): qty 150

## 2013-08-20 MED ORDER — ASPIRIN EC 81 MG PO TBEC
81.0000 mg | DELAYED_RELEASE_TABLET | Freq: Every day | ORAL | Status: DC
  Filled 2013-08-20: qty 1

## 2013-08-20 MED ORDER — ROCURONIUM BROMIDE 50 MG/5ML IV SOLN
50.0000 mg | Freq: Once | INTRAVENOUS | Status: AC
  Administered 2013-08-20: 50 mg via INTRAVENOUS

## 2013-08-20 MED ORDER — SODIUM BICARBONATE 8.4 % IV SOLN
INTRAVENOUS | Status: AC
  Administered 2013-08-20: 50 meq
  Administered 2013-08-20: 50 meq via INTRAVENOUS
  Filled 2013-08-20: qty 100

## 2013-08-20 MED ORDER — ENOXAPARIN SODIUM 30 MG/0.3ML ~~LOC~~ SOLN
30.0000 mg | SUBCUTANEOUS | Status: DC
  Filled 2013-08-20: qty 0.3

## 2013-08-20 SURGICAL SUPPLY — 46 items
1% LIDOCAINE WITH EPINEPHRINE 1:100,000 IMPLANT
BLADE SURG 15 STRL LF DISP TIS (BLADE) ×1 IMPLANT
BLADE SURG 15 STRL SS (BLADE) ×1
BLADE SURG ROTATE 9660 (MISCELLANEOUS) IMPLANT
CANISTER SUCTION 2500CC (MISCELLANEOUS) ×2 IMPLANT
CLEANER TIP ELECTROSURG 2X2 (MISCELLANEOUS) ×2 IMPLANT
CLOTH BEACON ORANGE TIMEOUT ST (SAFETY) ×2 IMPLANT
COVER SURGICAL LIGHT HANDLE (MISCELLANEOUS) ×2 IMPLANT
CRADLE DONUT ADULT HEAD (MISCELLANEOUS) IMPLANT
DECANTER SPIKE VIAL GLASS SM (MISCELLANEOUS) ×2 IMPLANT
ELECT COATED BLADE 2.86 ST (ELECTRODE) ×2 IMPLANT
ELECT REM PT RETURN 9FT ADLT (ELECTROSURGICAL) ×2
ELECTRODE REM PT RTRN 9FT ADLT (ELECTROSURGICAL) ×1 IMPLANT
GAUZE SPONGE 4X4 16PLY XRAY LF (GAUZE/BANDAGES/DRESSINGS) ×2 IMPLANT
GLOVE BIO SURGEON STRL SZ7.5 (GLOVE) ×2 IMPLANT
GLOVE BIOGEL PI IND STRL 6.5 (GLOVE) ×4 IMPLANT
GLOVE BIOGEL PI INDICATOR 6.5 (GLOVE) ×4
GLOVE SS N UNI LF 6.5 STRL (GLOVE) ×6 IMPLANT
GOWN STRL NON-REIN LRG LVL3 (GOWN DISPOSABLE) ×6 IMPLANT
HOLDER TRACH TUBE VELCRO 19.5 (MISCELLANEOUS) ×4 IMPLANT
HOVERMATT SINGLE USE (MISCELLANEOUS) ×2 IMPLANT
KIT BASIN OR (CUSTOM PROCEDURE TRAY) ×2 IMPLANT
KIT ROOM TURNOVER OR (KITS) ×2 IMPLANT
KIT SUCTION CATH 14FR (SUCTIONS) IMPLANT
NEEDLE HYPO 25GX1X1/2 BEV (NEEDLE) ×2 IMPLANT
NS IRRIG 1000ML POUR BTL (IV SOLUTION) ×2 IMPLANT
PACK EENT II TURBAN DRAPE (CUSTOM PROCEDURE TRAY) ×2 IMPLANT
PAD ARMBOARD 7.5X6 YLW CONV (MISCELLANEOUS) ×4 IMPLANT
PENCIL BUTTON HOLSTER BLD 10FT (ELECTRODE) ×2 IMPLANT
SPONGE DRAIN TRACH 4X4 STRL 2S (GAUZE/BANDAGES/DRESSINGS) ×2 IMPLANT
SPONGE INTESTINAL PEANUT (DISPOSABLE) IMPLANT
SUT MON AB 3-0 SH 27 (SUTURE) ×1
SUT MON AB 3-0 SH27 (SUTURE) ×1 IMPLANT
SUT SILK 0 FSL (SUTURE) ×4 IMPLANT
SUT SILK 2 0 REEL (SUTURE) ×2 IMPLANT
SUT SILK 2 0 SH CR/8 (SUTURE) ×2 IMPLANT
SUT VIC AB 2-0 FS1 27 (SUTURE) IMPLANT
SYR 20ML ECCENTRIC (SYRINGE) ×2 IMPLANT
SYR BULB 3OZ (MISCELLANEOUS) ×2 IMPLANT
SYR CONTROL 10ML LL (SYRINGE) ×2 IMPLANT
TOWEL OR 17X24 6PK STRL BLUE (TOWEL DISPOSABLE) ×2 IMPLANT
TOWEL OR 17X26 10 PK STRL BLUE (TOWEL DISPOSABLE) ×2 IMPLANT
TUBE CONNECTING 12X1/4 (SUCTIONS) ×2 IMPLANT
TUBE TRACH 8.0 EXL PROX  CUF (TUBING) ×1
TUBE TRACH 8.0 EXL PROX CUF (TUBING) ×1 IMPLANT
WATER STERILE IRR 1000ML POUR (IV SOLUTION) ×2 IMPLANT

## 2013-08-20 NOTE — Anesthesia Preprocedure Evaluation (Signed)
Anesthesia Evaluation  Patient identified by MRN, date of birth, ID band  Reviewed: Unable to perform ROS - Chart review only  Airway       Dental   Pulmonary shortness of breath, pneumonia -, Recent URI ,  Emergency trach performed this am, now to OR for completion trach   + decreased breath sounds      Cardiovascular hypertension, Rate:Tachycardia     Neuro/Psych    GI/Hepatic   Endo/Other  diabetesMorbid obesity  Renal/GU ESRF and DialysisRenal disease     Musculoskeletal   Abdominal   Peds  Hematology  (+) anemia ,   Anesthesia Other Findings   Reproductive/Obstetrics                           Anesthesia Physical Anesthesia Plan  ASA: IV  Anesthesia Plan: General   Post-op Pain Management:    Induction: Inhalational  Airway Management Planned: Tracheostomy  Additional Equipment:   Intra-op Plan:   Post-operative Plan: Post-operative intubation/ventilation  Informed Consent: I have reviewed the patients History and Physical, chart, labs and discussed the procedure including the risks, benefits and alternatives for the proposed anesthesia with the patient or authorized representative who has indicated his/her understanding and acceptance.   Dental advisory given  Plan Discussed with: CRNA and Surgeon  Anesthesia Plan Comments:         Anesthesia Quick Evaluation

## 2013-08-20 NOTE — Progress Notes (Signed)
[facility] PROGRESS NOTE  Seth [name] [id number] DOB: 1966/06/03 DOA: 08/19/2013 PCP: [facility]  Assessment/Plan: 1. Acute hypercarbic hypoxemic respiratory failure. Patient with history of obstructive sleep apnea, obesity hypoventilation syndrome having a worsening ABG this morning. PH of 7.053. Pulmonary critical care immediately consulted, patient transferred to the intensive care unit, undergoing rapid sequence intubation.his chest x-ray overnight showed cardiomegaly with vascular congestion and possible interstitial edema. Plan to repeat chest x-ray post intubation. Patient will be transferred to the pulmonary critical care service, appreciate assistance. Of note patient on broad-spectrum empiric antibiotic therapy with IV cefepime. 2. Acute encephalopathy, likely secondary from hypercarbic respiratory failure. Patient's lethargic, becoming agitated, confused and disoriented. He has undergone rapid sequence intubation. 3. End-stage renal disease. Nephrology has been consulted, plan to continue hemodialysis during this hospitalization. 4. Hyponatremia. Suspect secondary to volume overload 5. Hyperkalemia. A.m. Lab work showing a potassium of 6.0, will require hemodialysis. Nephrology has been consulted. 6. Urinary tract infection. Will continue empiric IV antibiotic therapy with cefepime, blood cultures and urine cultures will need to be followed.  Code Status: full code Family Communication: family members could not be contacted this morning despite attempts from nursing staff. Disposition Plan: transferring patient to intensive care unit   Consultants:  Pulmonary critical care medicine  Procedures:  Plan for rapid sequence intubation  Antibiotics:  Cefepime  Vancomycin   HPI/Subjective: Patient is a pleasant 47 year old gentleman with a past medical history of morbid obesity, obesity hypoventilation syndrome/obstructive sleep apnea, end-stage renal disease on  hemodialysis who was admitted overnight, presented with complaints of shortness of breath and cough. He had stated not missing hemodialysis recently. On presentation he was found to be lethargic, as ABG showed a pH of 7.26, PCO2 of 61.5 and a PO2 of 77. He was admitted to the step down unit and started on NPPV. This morning patient remaining lethargic, confused, disoriented, as an ABG was repeated this time showing a worsening in his respiratory status. His pH was 7.05 with PCO2 of 95.4 and a PO2 of 86.9. I immediately consulted pulmonary critical care as he was transferred to the intensive care unit for rapid sequence intubation. Nursing staff attempted to get a hold of family members.  Objective: Filed Vitals:   08/20/13 0907  BP:   Pulse: 111  Temp:   Resp: 18    Intake/Output Summary (Last 24 hours) at 08/20/13 1011 Last data filed at 08/20/13 0346  Gross per 24 hour  Intake    718 ml  Output      0 ml  Net    718 ml   Filed Weights   08/19/13 1848 08/20/13 0226  Weight: 153.854 kg (339 lb 3 oz) 155 kg (341 lb 11.4 oz)    Exam:   General:  Patient lethargic, confused disoriented, becoming combative upon transferring to intensive care unit  Cardiovascular: regular rate rhythm normal S1-S2 no murmurs rubs or gallops  Respiratory: diminished breath sounds bilaterally, few expiratory wheezes  Abdomen: obese soft nontender nondistended  Musculoskeletal: positive edema to lower extremities  Data Reviewed: Basic Metabolic Panel:  Recent Labs Lab 08/19/13 1901 08/20/13 0355  NA 130* 130*  K 5.4* 6.0*  CL 86* 89*  CO2 25 24  GLUCOSE 248* 313*  BUN 55* 63*  CREATININE 10.88* 12.27*  CALCIUM 8.8 8.4   Liver Function Tests:  Recent Labs Lab 08/19/13 1901 08/20/13 0355  AST 17 18  ALT 19 18  ALKPHOS 61 60  BILITOT 0.3 0.3  PROT  9.7* 9.2*  ALBUMIN 4.1 3.8   No results found for this basename: LIPASE, AMYLASE,  in the last 168 hours No results found for this  basename: AMMONIA,  in the last 168 hours CBC:  Recent Labs Lab 08/19/13 1901 08/20/13 0355  WBC 12.5* 13.3*  NEUTROABS  --  9.8*  HGB 11.5* 11.3*  HCT 34.9* 34.7*  MCV 94.3 97.5  PLT 238 230   Cardiac Enzymes: No results found for this basename: CKTOTAL, CKMB, CKMBINDEX, TROPONINI,  in the last 168 hours BNP (last 3 results)  Recent Labs  08/19/13 2012  PROBNP 5311.0*   CBG:  Recent Labs Lab 08/19/13 1901 08/20/13 0759  GLUCAP 219* 280*    Recent Results (from the past 240 hour(s))  MRSA PCR SCREENING     Status: None   Collection Time    08/20/13  5:55 AM      Result Value Range Status   MRSA by PCR NEGATIVE  NEGATIVE Final   Comment:            The GeneXpert MRSA Assay (FDA     approved for NASAL specimens     only), is one component of a     comprehensive MRSA colonization     surveillance program. It is not     intended to diagnose MRSA     infection nor to guide or     monitor treatment for     MRSA infections.     Studies: Dg Chest 2 View  08/19/2013   CLINICAL DATA:  Shortness of breath.  EXAM: CHEST  2 VIEW  COMPARISON:  05/01/2012  FINDINGS: Cardiomegaly with vascular congestion. Diffuse interstitial prominence could reflect early interstitial edema. No confluent opacities or effusions. No acute bony abnormality.  IMPRESSION: Cardiomegaly with vascular congestion and possible or interstitial edema.   Electronically Signed   By: Rolm Baptise M.D.   On: 08/19/2013 20:34    Scheduled Meds: . amLODipine  10 mg Oral Daily  . aspirin EC  81 mg Oral Daily  . ceFEPime (MAXIPIME) IV  2 g Intravenous Q M,W,F-2000  . enoxaparin (LOVENOX) injection  30 mg Subcutaneous Q24H  . fentaNYL      . insulin aspart  0-9 Units Subcutaneous TID WC  . midazolam      . sodium chloride  3 mL Intravenous Q12H  . sodium chloride  3 mL Intravenous Q12H  . tamsulosin  0.4 mg Oral Daily  . [START ON 08/21/2013] vancomycin  1,250 mg Intravenous Q M,W,F-HD   Continuous  Infusions:   Principal Problem:   Acute-on-chronic respiratory failure Active Problems:   End stage renal disease   Acute tracheobronchitis   Pulmonary edema   Acute encephalopathy    Time spent: 60 minutes    [name]  [facility] Pager [phone] . If 7PM-7AM, please contact night-coverage at [email or web address], password [facility] 08/20/2013, 10:11 AM  LOS: 1 day

## 2013-08-20 NOTE — Consult Note (Signed)
Seth [name] 08/20/2013 [name]:  [place]  Reason for Consult:  ESRD, Respiratory Failure HPI: 61M ESRD MWF [name] via AVF  Pt attended all treatments last week and yesterday 10/10.  EDW is 148.5 and pt left yesterday at 153.4kg.  Pt with large IDWG on consistent basis and unable to achieve EDW historically.  Treatment yesterday was cut short ending after 3h70min.  He has not been using his CPAP during Tx.  By report pt with 2-3d of cough, lethargy, low grade fevers.  In ED was placed on BiPAP and given ABX for hypercapnea.    Upon my evaluation pt is lethargic and with worsened hypercapenia.  He is being taken to MICU for intubation.  In MICU had resp decompensation and required emergent surgical airway.  Now to go to OR to secure airway.  He received IV HCO3 during treatment.    I/O last 3 completed shifts: In: [id number] [P.O.:118; I.V.:550; IV Piggyback:50] Out: [name] Weights   08/19/13 1848 08/20/13 0226  Weight: 153.854 kg (339 lb 3 oz) 155 kg (341 lb 11.4 oz)    ROS Pt unable to provide  Outpt HD Orders Unit: [place] Time: 5h 79min Dialyzer: F200 EDW: 148.5kg K/Ca:  2k/2.25Ca Access: AVF RFA BFR: 550 UF Proflie: none VDRA: Hectorol 10 qTx EPO: 3000 qTx IV Fe: Venofer 50 qWk  PMH  Past Medical History  Diagnosis Date  . Diabetes mellitus   . Hypertension   . Chronic kidney disease   . 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 Av fistula placement  05/03/2012    Procedure: ARTERIOVENOUS (AV) FISTULA CREATION;  Surgeon: Mal [name], MD;  Location: [facility] OR;  Service: Vascular;  Laterality: Right;   FH  Family History  Problem Relation Age of Onset  . Diabetes Mother   . Diabetes Sister   . Diabetes Brother    SH  reports that he has never smoked. He has never used smokeless tobacco. He reports that he drinks about 0.6  ounces of alcohol per week. He reports that he uses illicit drugs (Marijuana). Allergies No Known Allergies Home medications Prior to Admission medications   Medication Sig Start Date End Date Taking? Authorizing Provider  amLODipine (NORVASC) 10 MG tablet Take 10 mg by mouth daily.   Yes Historical Provider, MD  aspirin EC 81 MG tablet Take 81 mg by mouth daily.   Yes Historical Provider, MD  insulin aspart (NOVOLOG) 100 UNIT/ML injection Inject 0-15 Units into the skin 3 (three) times daily with meals. 05/05/12  Yes [name], DO  Tamsulosin HCl (FLOMAX) 0.4 MG CAPS Take 0.4 mg by mouth daily.   Yes Historical Provider, MD  Vitamin D, Ergocalciferol, (DRISDOL) 50000 UNITS CAPS take 1 capsule by mouth every week 06/07/12  Yes [name], MD  Insulin Pen Needle 31G X 8 MM MISC Use to inject sliding scale insulin three times daily before meals. 05/05/12   [name], DO    Current Medications Current Facility-Administered Medications  Medication Dose Route Frequency Provider Last Rate Last Dose  . acetaminophen (TYLENOL) tablet 650 mg  650 mg Oral Q6H PRN [name], MD       Or  . acetaminophen (TYLENOL) suppository 650 mg  650 mg Rectal Q6H PRN [name], MD      .  amLODipine (NORVASC) tablet 10 mg  10 mg Oral Daily [name], MD      . aspirin EC tablet 81 mg  81 mg Oral Daily [name], MD      . ceFEPIme (MAXIPIME) 2 g in dextrose 5 % 50 mL IVPB  2 g Intravenous Q M,W,F-2000 Rogue Bussing, RPH   2 g at 08/20/13 0346  . enoxaparin (LOVENOX) injection 30 mg  30 mg Subcutaneous Q24H [name], MD      . insulin aspart (novoLOG) injection 0-9 Units  0-9 Units Subcutaneous TID WC [name], MD      . ondansetron [facility]) tablet 4 mg  4 mg Oral Q6H PRN [name], MD       Or  . ondansetron [facility]) injection 4 mg  4 mg Intravenous Q6H PRN [name], MD      . sodium chloride  0.9 % injection 3 mL  3 mL Intravenous Q12H [name], MD      . sodium chloride 0.9 % injection 3 mL  3 mL Intravenous Q12H [name], MD   3 mL at 08/20/13 0347  . tamsulosin (FLOMAX) capsule 0.4 mg  0.4 mg Oral Daily [name], MD      . Derrill Memo ON 08/21/2013] vancomycin (VANCOCIN) 1,250 mg in sodium chloride 0.9 % 250 mL IVPB  1,250 mg Intravenous Q M,W,F-HD Rogue Bussing, [facility]         CBC  Recent Labs Lab 08/19/13 1901 08/20/13 0355  WBC 12.5* 13.3*  NEUTROABS  --  9.8*  HGB 11.5* 11.3*  HCT 34.9* 34.7*  MCV 94.3 97.5  PLT 238 [id number]   Basic Metabolic Panel  Recent Labs Lab 08/19/13 1901 08/20/13 0355  NA 130* 130*  K 5.4* 6.0*  CL 86* 89*  CO2 25 24  GLUCOSE 248* 313*  BUN 55* 63*  CREATININE 10.88* 12.27*  CALCIUM 8.8 8.4    Physical Exam  Blood pressure 139/50, pulse 111, temperature 97.5 F (36.4 C), temperature source Axillary, resp. rate 18, height 5\' 8"  (1.727 m), weight 155 kg (341 lb 11.4 oz), SpO2 100.00%. GEN: morbidly obese,  On BiPAP.  Some agitation ENT: BiPAP mask on.   EYES: eyes open CV: RRR. Nl s1s2 PULM: coarse bs b/l ABD: obese, soft SKIN: ETT in midline of neck.   EXT:no LEE.  RFA AVF +b/t   A/P 1. ESRD: Tentative plan for HD today after OR.  Goal UF 4L.  If tolerates likely will need another session tomorrow.   2. HTN/Vol: Large gains and inability to achieve a reasonable EDW a chronic problem.  Will use this hospitalization to challenge his EDW as much as possible once more stable.   3. 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  Add on Phos.  Pt on home cinacalcet per HD notes.  On PHosLo, can resume once resp status improves.   5. URI/PNA and hypercapneic RF: per CCM: on ABX.   6. Surgical airway: ENT to take to OR today to secure.    [name] MD 08/20/2013, 9:46 AM

## 2013-08-20 NOTE — Procedures (Addendum)
Intubation Procedure Note TRAYSEN SHIERS [id number] 1966-03-07  Procedure: Intubation Indications: Respiratory insufficiency  Procedure Details Consent: Unable to obtain consent because of emergent medical necessity. Time Out: Verified patient identification, verified procedure, site/side was marked, verified correct patient position, special equipment/implants available, medications/allergies/relevent history reviewed, required imaging and test results available.  Performed  Maximum sterile technique was used including antiseptics, gloves, hand hygiene and mask.  MAC    Evaluation Hemodynamic Status: Transient hypotension resolved spontaneously; O2 sats: stable throughout Patient's Current Condition: stable Complications: Complications of Please see separate note.  Unable to intubate. Patient did tolerate procedure well. Chest X-ray ordered to verify placement.  CXR: pending.   YACOUB,WESAM 08/20/2013

## 2013-08-20 NOTE — Significant Event (Signed)
Rapid Response Event Note  Overview:  Called by Nursing Staff to assist with possible intubation on the floor and transfer to ICU Time Called: 0940 Arrival Time: 0942 Event Type: Respiratory  Initial Focused Assessment:  On arrival - patient sitting upright in bed on BiPap.  Dr. Coralyn Pear and Dr. Nelda Marseille present.  W/d.  Arouses.  Confused and somewhat agitated.  MAE x 4.  Follows commands - not co-operative.  Moderate distress.  Currently VSS.  Fighting with the RN re: moving to another unit.  Transferred with RN x 3 and MD and RT - patient pulled off Bipap - speaking full sentences - agitated.     Interventions:  Stat transfer to 2M13 with Dr. Nelda Marseille.  Prepared for intubation with Versed 2mg  IV and 100 mcg Fentanyl per Dr. Nelda Marseille.  Difficult airway - see CCM notes.  Remained with patient throughout intubation event - drugs pushed as ordered.  IV site maintained.  Assisted ICU staff with care of patient.  Handoff to [employer].     Event Summary: Name of Physician Notified: Dr. Coralyn Pear at  [facility])  Name of Consulting Physician Notified: Dr, Nelda Marseille at [phone]  Outcome: Transferred (Comment)  Event End Time: 1245  Quin Hoop

## 2013-08-20 NOTE — ED Notes (Signed)
Pt sitting on edge of bed, "feels OK, feels better", denies pain, c/c is congestion, describes phlegm as hard to get up, white/ yellow thick tenacious hard to get up, agreeable to admit plan, denies questions.

## 2013-08-20 NOTE — Procedures (Signed)
Bronchoscopy Procedure Note EFSTRATIOS NAKASHIMA [id number] 07/02/1966  Procedure: Bronchoscopy Indications: Remove secretions  Procedure Details Consent: Unable to obtain consent because of emergent medical necessity. Time Out: Verified patient identification, verified procedure, site/side was marked, verified correct patient position, special equipment/implants available, medications/allergies/relevent history reviewed, required imaging and test results available.  Performed  Patient difficult to bag, bronch placed through ET tube, large amount of blood clots removed.  In preparation for procedure, patient was given 100% FiO2 and bronchoscope lubricated. Sedation: Benzodiazepines, Muscle relaxants and Etomidate  Airway entered and the following bronchi were examined: RUL, RML, RLL, LUL, LLL and Bronchi.   Procedures performed: Brushings performed Bronchoscope removed.    Evaluation Hemodynamic Status: BP stable throughout; O2 sats: stable throughout Patient's Current Condition: stable Specimens:  Sent serosanguinous fluid Complications: No apparent complications Patient did tolerate procedure well.   YACOUB,WESAM 08/20/2013

## 2013-08-20 NOTE — Procedures (Signed)
I was present at this dialysis session. I have reviewed the session itself and made appropriate changes.   Pt on HD with K 7.3 before going to OR.  Plan for HD x 12min and then check stat K to see if improved.  Can provide more thorough HD after securing airway.  AVF Qb 450 SBP 100s.    [name]  MD 08/20/2013, 2:52 PM

## 2013-08-20 NOTE — Progress Notes (Signed)
ANTIBIOTIC CONSULT NOTE - FOLLOW UP  Pharmacy Consult for Vancomycin, Cefepime Indication: rule out sepsis  No Known Allergies Patient Measurements: Height: 5\' 8"  (172.7 cm) Weight: 340 lb 13.3 oz (154.6 kg) IBW/kg (Calculated) : 68.4 Vital Signs: Temp: 99.2 F (37.3 C) (11/11 1357) Temp src: Axillary (11/11 1357) BP: 93/47 mmHg (11/11 1357) Pulse Rate: 79 (11/11 1400) Intake/Output from previous day: 11/10 0701 - 11/11 0700 In: 718 [P.O.:118; I.V.:550; IV Piggyback:50] Out: -  Labs:  Recent Labs  08/19/13 1901 08/20/13 0355 08/20/13 1131  WBC 12.5* 13.3* 12.2*  HGB 11.5* 11.3* 10.5*  PLT 238 230 217  CREATININE 10.88* 12.27* 13.21*   Estimated Creatinine Clearance: 9.8 ml/min (by C-G formula based on Cr of 13.21). Microbiology: Recent Results (from the past 720 hour(s))  MRSA PCR SCREENING     Status: None   Collection Time    08/20/13  5:55 AM      Result Value Range Status   MRSA by PCR NEGATIVE  NEGATIVE Final   Comment:            The GeneXpert MRSA Assay (FDA     approved for NASAL specimens     only), is one component of a     comprehensive MRSA colonization     surveillance program. It is not     intended to diagnose MRSA     infection nor to guide or     monitor treatment for     MRSA infections.    Anti-infectives   Start     Dose/Rate Route Frequency Ordered Stop   08/21/13 1200  vancomycin (VANCOCIN) 1,250 mg in sodium chloride 0.9 % 250 mL IVPB     1,250 mg 250 mL/hr over 60 Minutes Intravenous Every M-W-F (Hemodialysis) 08/19/13 2231     08/20/13 1415  ceFEPIme (MAXIPIME) 2 g in dextrose 5 % 50 mL IVPB     2 g 100 mL/hr over 30 Minutes Intravenous  Once 08/20/13 1406     08/20/13 1415  vancomycin (VANCOCIN) IVPB 1000 mg/200 mL premix     1,000 mg 200 mL/hr over 60 Minutes Intravenous  Once 08/20/13 1406     08/20/13 0300  ceFEPIme (MAXIPIME) 2 g in dextrose 5 % 50 mL IVPB     2 g 100 mL/hr over 30 Minutes Intravenous Every M-W-F (2000)  08/20/13 0246     08/19/13 2230  vancomycin (VANCOCIN) 2,500 mg in sodium chloride 0.9 % 500 mL IVPB     2,500 mg 250 mL/hr over 120 Minutes Intravenous  Once 08/19/13 2222 08/20/13 0146   08/19/13 2215  piperacillin-tazobactam (ZOSYN) IVPB 3.375 g     3.375 g 100 mL/hr over 30 Minutes Intravenous  Once 08/19/13 2205 08/20/13 0149     Assessment: 56 YOM s/p emergent difficult intubation to go to OR today for trach also with ESRD s/p HD on Monday and for HD again today prior to the OR for hyperkalemia. Patient on vancomycin and cefepime empiric for sepsis. Patient received 2.5g of vancomycin at 22:49 on 11/10. Patient really needs full load close to 3.5g based on TBW. Patient also received 2g Cefepime at 3AM.   Plan for 5hr of HD or 1 hr HD and if K has come down may go directly to OR.   Goal of Therapy:  Vancomycin trough level 15-20 mcg/ml  Plan:  1. Will give another 1g of vancomycin post HD today - regardless of time on HD.  2. Will give another 2g  of Cefepime post-HD today- regardless of time on HD.  3. Will check random vancomycin level with AM labs to reassess further dosing.  4. Follow-up HD plans and need for further antibiotic adjustments. 5. Follow-up clinical status, culture data, and narrow as able.   Sloan Leiter, PharmD, BCPS Clinical Pharmacist [phone] 08/20/2013,2:08 PM

## 2013-08-20 NOTE — Progress Notes (Signed)
Transfer to 89M Attempted to notifiy family- unable to reach numbers in system are wrong. Pt provided number-left messaged no return call at this time.

## 2013-08-20 NOTE — Progress Notes (Signed)
Notified mother of patients new room.

## 2013-08-20 NOTE — Brief Op Note (Signed)
08/19/2013 - 08/20/2013  5:44 PM  PATIENT:  Seth [name]  47 y.o. male  PRE-OPERATIVE DIAGNOSIS:  revision emergency airway  POST-OPERATIVE DIAGNOSIS:  Respiratory failure, Status post slash surgical airway, Revision of emergeny airway  PROCEDURE:  Procedure(s): TRACHEOSTOMY Revision (N/A)  SURGEON:  Surgeon(s) and Role:    * Melida Quitter, MD - Primary  PHYSICIAN ASSISTANT:   ASSISTANTS: none   ANESTHESIA:   general  EBL:  Total I/O In: 612 [I.V.:362; IV Piggyback:250] Out: 754 [Other:754]  BLOOD ADMINISTERED:none  DRAINS: none   LOCAL MEDICATIONS USED:  NONE  SPECIMEN:  No Specimen  DISPOSITION OF SPECIMEN:  N/A  COUNTS:  YES  TOURNIQUET:  * No tourniquets in log *  DICTATION: .Other Dictation: Dictation Number [id number]  PLAN OF CARE: Return to ICU  PATIENT DISPOSITION:  ICU - intubated and hemodynamically stable.   Delay start of Pharmacological VTE agent (>24hrs) due to surgical blood loss or risk of bleeding: no

## 2013-08-20 NOTE — Progress Notes (Signed)
Pt transferred from the ED, admitted to Rm/3S11. Pt comes from home with family. He is alert and oriented, ambulatory with standby assist. No skin breakdown noted. Placed on telemetry, NSR. Placed on BiPap per orders. Oriented to room, instructed to call for assistance before getting out of bed. Resting comfortably at this time, will continue to monitor

## 2013-08-20 NOTE — H&P (Addendum)
[facility] History and Physical  GARED GAZDA [id number] DOB: October 26, 1965 DOA: 08/19/2013  Referring physician: ER physician. PCP: [facility]  Specialists: Nephrologist for dialysis.  Chief Complaint: Shortness of breath.  HPI: Seth [name] is a 47 y.o. male was brought to the ER after patient was found to have increasing shortness of breath. Patient has been having dialysis regularly and states he has not missed his dialysis. He has been having some nonproductive cough for last 2-3 days with some fever and chills. He also was recently placed on CPAP for his obstructive sleep apnea which he has been regularly using for last 2 weeks. He has noticed some voice changes with his cough. In the ER patient was found to be lethargic and ABG showed hypercapnia and patient was placed on BiPAP. After a brief time patient's mental status improved largely. Critical care was consulted and at this time patient has been diabetic for further management. Patient denies any chest pain nausea vomiting abdominal pain. In the ER patient was found to be febrile and was placed on empiric antibiotics after blood cultures were taken. Chest x-ray shows features consistent with chest congestion.   Review of Systems: As presented in the history of presenting illness, rest negative.  Past Medical History  Diagnosis Date  . Diabetes mellitus   . Hypertension   . Chronic kidney disease   . 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 Av fistula placement  05/03/2012    Procedure: ARTERIOVENOUS (AV) FISTULA CREATION;  Surgeon: Mal [name], MD;  Location: [place];  Service: Vascular;  Laterality: Right;   Social History:  reports that he has never smoked. He has never used smokeless tobacco. He reports that he drinks about 0.6 ounces of alcohol per week. He reports that he uses illicit drugs  (Marijuana). Where does patient live home. Can patient participate in ADLs? Yes.  No Known Allergies  Family History:  Family History  Problem Relation Age of Onset  . Diabetes Mother   . Diabetes Sister   . Diabetes Brother       Prior to Admission medications   Medication Sig Start Date End Date Taking? Authorizing Provider  amLODipine (NORVASC) 10 MG tablet Take 10 mg by mouth daily.   Yes Historical Provider, MD  aspirin EC 81 MG tablet Take 81 mg by mouth daily.   Yes Historical Provider, MD  insulin aspart (NOVOLOG) 100 UNIT/ML injection Inject 0-15 Units into the skin 3 (three) times daily with meals. 05/05/12  Yes [name], DO  Tamsulosin HCl (FLOMAX) 0.4 MG CAPS Take 0.4 mg by mouth daily.   Yes Historical Provider, MD  Vitamin D, Ergocalciferol, (DRISDOL) 50000 UNITS CAPS take 1 capsule by mouth every week 06/07/12  Yes [name], MD  Insulin Pen Needle 31G X 8 MM MISC Use to inject sliding scale insulin three times daily before meals. 05/05/12   Annamarie Dawley, DO    Physical Exam: Filed Vitals:   08/19/13 2224 08/19/13 2345 08/19/13 2346 08/20/13 0033  BP:  138/93 138/93   Pulse: 98 90 90 90  Temp:      TempSrc:      Resp: 21 19  24   Height: 5\' 7"  (1.702 m)     Weight:      SpO2: 97% 100%  98%     General:  Well-developed well-nourished.  Eyes: Anicteric no pallor.  ENT: No discharge from the ears eyes nose mouth.  Neck: No mass felt.  Cardiovascular: S1-S2 heard.  Respiratory: No rhonchi or crepitations.  Abdomen: Soft nontender bowel sounds present.  Skin: No rash.  Musculoskeletal: No edema.  Psychiatric: Appears normal.  Neurologic: Alert awake oriented to time place and person. Moves all extremities.  Labs on Admission:  Basic Metabolic Panel:  Recent Labs Lab 08/19/13 1901  NA 130*  K 5.4*  CL 86*  CO2 25  GLUCOSE 248*  BUN 55*  CREATININE 10.88*  CALCIUM 8.8   Liver Function Tests:  Recent  Labs Lab 08/19/13 1901  AST 17  ALT 19  ALKPHOS 61  BILITOT 0.3  PROT 9.7*  ALBUMIN 4.1   No results found for this basename: LIPASE, AMYLASE,  in the last 168 hours No results found for this basename: AMMONIA,  in the last 168 hours CBC:  Recent Labs Lab 08/19/13 1901  WBC 12.5*  HGB 11.5*  HCT 34.9*  MCV 94.3  PLT 238   Cardiac Enzymes: No results found for this basename: CKTOTAL, CKMB, CKMBINDEX, TROPONINI,  in the last 168 hours  BNP (last 3 results)  Recent Labs  08/19/13 2012  PROBNP 5311.0*   CBG:  Recent Labs Lab 08/19/13 1901  GLUCAP 219*    Radiological Exams on Admission: Dg Chest 2 View  08/19/2013   CLINICAL DATA:  Shortness of breath.  EXAM: CHEST  2 VIEW  COMPARISON:  05/01/2012  FINDINGS: Cardiomegaly with vascular congestion. Diffuse interstitial prominence could reflect early interstitial edema. No confluent opacities or effusions. No acute bony abnormality.  IMPRESSION: Cardiomegaly with vascular congestion and possible or interstitial edema.   Electronically Signed   By: Rolm Baptise M.D.   On: 08/19/2013 20:34     Assessment/Plan Principal Problem:   Acute-on-chronic respiratory failure Active Problems:   End stage renal disease   Acute tracheobronchitis   Pulmonary edema   Acute encephalopathy   1. Acute on chronic respiratory failure with hypercapnia - probably secondary to combination of OSA and mild pulmonary edema secondary to his ESRD. Presently we will closely monitor him and step down and as advised by critical care patient will be on when necessary BiPAP and repeat ABG in a.m. Consult nephrologist for dialysis. 2. Acute encephalopathy - probably from hypercarbia which has improved. 3. Fever probably from tracheobronchitis and possible UTI - continue empiric antibiotics for now. Check influenza PCR. Follow cultures. 4. ESRD on hemodialysis - consult nephrologist for dialysis. 5. OSA - presently on BiPAP. 6. Diabetes mellitus  type 2 - on sliding-scale coverage. 7. Anemia probably from ESRD - closely follow CBC.    Code Status: Full code.  Family Communication: None.  Disposition Plan: Admit to inpatient.    Relda Agosto N. [facility] Pager [phone].  If 7PM-7AM, please contact night-coverage [email or web address] Password [email or web address] 08/20/2013, 12:38 AM

## 2013-08-20 NOTE — Progress Notes (Signed)
Report provided to [facility] RN. Belongs brought to unit.

## 2013-08-20 NOTE — Transfer of Care (Signed)
Immediate Anesthesia Transfer of Care Note  Patient: Seth [name]  Procedure(s) Performed: Procedure(s): TRACHEOSTOMY Revision (N/A)  Patient Location: ICU  Anesthesia Type:General  Level of Consciousness: sedated and unresponsive  Airway & Oxygen Therapy: Patient remains intubated per anesthesia plan  Post-op Assessment: Report given to PACU RN and Post -op Vital signs reviewed and stable  Post vital signs: Reviewed and stable  Complications: No apparent anesthesia complications

## 2013-08-20 NOTE — Procedures (Signed)
Central Venous Catheter Insertion Procedure Note Seth [name] [id number] [birth year]  Procedure: Insertion of Central Venous Catheter Indications: Drug and/or fluid administration  Procedure Details Consent: Unable to obtain consent because of emergent medical necessity. Time Out: Verified patient identification, verified procedure, site/side was marked, verified correct patient position, special equipment/implants available, medications/allergies/relevent history reviewed, required imaging and test results available.  Performed  Maximum sterile technique was used including antiseptics, cap, gloves, gown, hand hygiene, mask and sheet. Skin prep: Chlorhexidine; local anesthetic administered A antimicrobial bonded/coated triple lumen catheter was placed in the right femoral vein due to emergent situation using the Seldinger technique.  Evaluation Blood flow good Complications: No apparent complications Patient did tolerate procedure well. Chest X-ray ordered to verify placement.  CXR: pending.  YACOUB,WESAM 08/20/2013, 11:16 AM

## 2013-08-20 NOTE — Procedures (Signed)
Bronchoscopy Procedure Note ODARIUS BAUERMEISTER [id number] 1966/04/18  Procedure: Bronchoscopy Indications: Diagnostic evaluation of the airways  Procedure Details Consent: Unable to obtain consent because of emergent medical necessity. Time Out: Verified patient identification, verified procedure, site/side was marked, verified correct patient position, special equipment/implants available, medications/allergies/relevent history reviewed, required imaging and test results available.  Performed  In preparation for procedure, patient was given 100% FiO2 and bronchoscope lubricated. Sedation: Benzodiazepines, Muscle relaxants and Etomidate  Airway entered and the following bronchi were examined: RUL, RML, RLL, LUL, LLL and Bronchi.   Procedures performed: Brushings performed Bronchoscope removed.  , Patient placed back on 100% FiO2 at conclusion of procedure.    Evaluation Hemodynamic Status: BP stable throughout; O2 sats: stable throughout Patient's Current Condition: stable Specimens:  None Complications: No apparent complications Patient did tolerate procedure well.  Bronchoscope placed through the ET tube and visualized 2 cm above carina.  YACOUB,WESAM 08/20/2013

## 2013-08-20 NOTE — Progress Notes (Signed)
CRITICAL VALUE ALERT  Critical value received:  CO2 95.4 and pH 7.05  Date of notification:  08/20/2013  Time of notification:  0935  Critical value read back: yes  Nurse who received alert:  Cherly Beach, RN  MD notified (1st page):  Dr. Coralyn Pear   Time of first page:  0935  MD notified (2nd page):   Time of second page:  Responding MD:  Dr Coralyn Pear   Time MD responded: [phone]  Nephrology at bedside aware of results Dr. Coralyn Pear notifying CCM of results.   RN at bedside, setting up suction, and await further instruction.

## 2013-08-20 NOTE — Procedures (Signed)
Emergency surgical Airway  Multiple emergent efforts for airway, difficult airway, trauma to airway Loletha Grayer to 30's, desat No time sterilization, attempted chloraprep vertical 1.5 cm incision made over what I thought was cricoid Dissected with kelly clamp to slight above trachea. Very difficult anatomy to discern crich or trach below Placed 18 gauge needle I think below cricoid Air back Placed wire, without resistance Punch dilator placed in /out 14 french Placed progressive rhino dilator over glider and wire in /out Attempted to place 6 trach over 26 dila, did not pass Removed trach Placed 6 ETT over wire and glider easily Placed bronc through , confirmed carina Sutured best can monofilament suture Blood loss 20 cc  Dy Nelda Marseille to call ENT to revise   Lavon Paganini. Titus Mould, MD, [place] Pgr: [facility] '

## 2013-08-20 NOTE — Procedures (Signed)
Arterial Catheter Insertion Procedure Note Seth [name] [id number] September 06, 1966  Procedure: Insertion of Arterial Catheter  Indications: Blood pressure monitoring  Procedure Details Consent: Unable to obtain consent because of emergent medical necessity. Time Out: Verified patient identification, verified procedure, site/side was marked, verified correct patient position, special equipment/implants available, medications/allergies/relevent history reviewed, required imaging and test results available.  Performed  Maximum sterile technique was used including antiseptics, cap, gloves, gown, hand hygiene, mask and sheet. Skin prep: Chlorhexidine; local anesthetic administered 20 gauge catheter was inserted into right radial artery using the Seldinger technique.  Evaluation Blood flow good; BP tracing good. Complications: No apparent complications.   Seth [name] 08/20/2013

## 2013-08-20 NOTE — Progress Notes (Signed)
CRITICAL VALUE ALERT  Critical value received:  K 7.3   Date of notification:  12:28PM  Time of notification:  08/20/2013   Critical value read back:yes  Nurse who received alert:  Vicente Masson RN  MD notified (1st page):  Dr Nelda Marseille  Time of first page:  12:29PM  MD notified (2nd page):  Time of second page:  Responding MD:  Dr. Nelda Marseille  Time MD responded:  12:29PM

## 2013-08-20 NOTE — Consult Note (Addendum)
Reason for Consult:Surgical airway Referring Physician: PCCM  Seth [name] is an 47 y.o. male.  HPI: 47 year old male was admitted last night due to altered mental status.  He had hemodialysis yesterday but came to the ER due to feeling bad and altered mental status.  He was observed overnight in stepdown unit trying to treat respiratory failure with BiPAP without success.  This morning, he was moved to the ICU due to worsening of respiratory status.  He was given Etomidate and intubation was attempted but could not be successfully performed.  With worsening condition, a slash surgical airway was required.  Revision has been requested.  Prior to going to the OR, however, his extremely high potassium level required treatment, per anesthesiology, via hemodialysis.  That has now been completed.  Past Medical History  Diagnosis Date  . Diabetes mellitus   . Hypertension   . Chronic kidney disease   . 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 Av fistula placement  05/03/2012    Procedure: ARTERIOVENOUS (AV) FISTULA CREATION;  Surgeon: Mal [name], MD;  Location: [facility] OR;  Service: Vascular;  Laterality: Right;    Family History  Problem Relation Age of Onset  . Diabetes Mother   . Diabetes Sister   . Diabetes Brother     Social History:  reports that he has never smoked. He has never used smokeless tobacco. He reports that he drinks about 0.6 ounces of alcohol per week. He reports that he uses illicit drugs (Marijuana).  Allergies: No Known Allergies  Medications: I have reviewed the patient's current medications.  Results for orders placed during the hospital encounter of 08/19/13 (from the past 48 hour(s))  CBC     Status: Abnormal   Collection Time    08/19/13  7:01 PM      Result Value Range   WBC 12.5 (*) 4.0 - 10.5 K/uL   RBC 3.70 (*) 4.22 - 5.81 MIL/uL   Hemoglobin 11.5  (*) 13.0 - 17.0 g/dL   HCT 34.9 (*) 39.0 - 52.0 %   MCV 94.3  78.0 - 100.0 fL   MCH 31.1  26.0 - 34.0 pg   MCHC 33.0  30.0 - 36.0 g/dL   RDW 13.5  11.5 - 15.5 %   Platelets 238  150 - 400 K/uL  COMPREHENSIVE METABOLIC PANEL     Status: Abnormal   Collection Time    08/19/13  7:01 PM      Result Value Range   Sodium 130 (*) 135 - 145 mEq/L   Potassium 5.4 (*) 3.5 - 5.1 mEq/L   Chloride 86 (*) 96 - 112 mEq/L   CO2 25  19 - 32 mEq/L   Glucose, Bld 248 (*) 70 - 99 mg/dL   BUN 55 (*) 6 - 23 mg/dL   Creatinine, Ser 10.88 (*) 0.50 - 1.35 mg/dL   Calcium 8.8  8.4 - 10.5 mg/dL   Total Protein 9.7 (*) 6.0 - 8.3 g/dL   Albumin 4.1  3.5 - 5.2 g/dL   AST 17  0 - 37 U/L   ALT 19  0 - 53 U/L   Alkaline Phosphatase 61  39 - 117 U/L   Total Bilirubin 0.3  0.3 - 1.2 mg/dL   GFR calc non Af Amer 5 (*) >90 mL/min   GFR calc Af Amer 6 (*) >  90 mL/min   Comment: (NOTE)     The eGFR has been calculated using the CKD EPI equation.     This calculation has not been validated in all clinical situations.     eGFR's persistently <90 mL/min signify possible Chronic Kidney     Disease.  GLUCOSE, CAPILLARY     Status: Abnormal   Collection Time    08/19/13  7:01 PM      Result Value Range   Glucose-Capillary 219 (*) 70 - 99 mg/dL  PRO B NATRIURETIC PEPTIDE     Status: Abnormal   Collection Time    08/19/13  8:12 PM      Result Value Range   Pro B Natriuretic peptide (BNP) 5311.0 (*) 0 - 125 pg/mL  POCT I-STAT 3, BLOOD GAS (G3+)     Status: Abnormal   Collection Time    08/19/13  9:54 PM      Result Value Range   pH, Arterial 7.261 (*) 7.350 - 7.450   pCO2 arterial 61.5 (*) 35.0 - 45.0 mmHg   pO2, Arterial 77.0 (*) 80.0 - 100.0 mmHg   Bicarbonate 27.7 (*) 20.0 - 24.0 mEq/L   TCO2 30  0 - 100 mmol/L   O2 Saturation 93.0     Acid-base deficit 1.0  0.0 - 2.0 mmol/L   Patient temperature 98.6 F     Collection site RADIAL, ALLEN'S TEST ACCEPTABLE     Drawn by RT     Sample type ARTERIAL      Comment NOTIFIED PHYSICIAN    LACTIC ACID, PLASMA     Status: None   Collection Time    08/19/13 10:45 PM      Result Value Range   Lactic Acid, Venous 0.7  0.5 - 2.2 mmol/L  URINALYSIS, ROUTINE W REFLEX MICROSCOPIC     Status: Abnormal   Collection Time    08/19/13 10:54 PM      Result Value Range   Color, Urine AMBER (*) YELLOW   Comment: BIOCHEMICALS MAY BE AFFECTED BY COLOR   APPearance TURBID (*) CLEAR   Specific Gravity, Urine >1.030 (*) 1.005 - 1.030   pH 5.0  5.0 - 8.0   Glucose, UA NEGATIVE  NEGATIVE mg/dL   Hgb urine dipstick LARGE (*) NEGATIVE   Bilirubin Urine SMALL (*) NEGATIVE   Ketones, ur 15 (*) NEGATIVE mg/dL   Protein, ur 100 (*) NEGATIVE mg/dL   Urobilinogen, UA 0.2  0.0 - 1.0 mg/dL   Nitrite POSITIVE (*) NEGATIVE   Leukocytes, UA SMALL (*) NEGATIVE  URINE MICROSCOPIC-ADD ON     Status: Abnormal   Collection Time    08/19/13 10:54 PM      Result Value Range   Squamous Epithelial / LPF MANY (*) RARE   WBC, UA 21-50  <3 WBC/hpf   RBC / HPF TOO NUMEROUS TO COUNT  <3 RBC/hpf   Bacteria, UA MANY (*) RARE   Casts HYALINE CASTS (*) NEGATIVE   Urine-Other LESS THAN 10 mL OF URINE SUBMITTED    CG4 I-STAT (LACTIC ACID)     Status: None   Collection Time    08/19/13 11:12 PM      Result Value Range   Lactic Acid, Venous 1.00  0.5 - 2.2 mmol/L  COMPREHENSIVE METABOLIC PANEL     Status: Abnormal   Collection Time    08/20/13  3:55 AM      Result Value Range   Sodium 130 (*) 135 - 145 mEq/L  Potassium 6.0 (*) 3.5 - 5.1 mEq/L   Chloride 89 (*) 96 - 112 mEq/L   CO2 24  19 - 32 mEq/L   Glucose, Bld 313 (*) 70 - 99 mg/dL   BUN 63 (*) 6 - 23 mg/dL   Creatinine, Ser 12.27 (*) 0.50 - 1.35 mg/dL   Calcium 8.4  8.4 - 10.5 mg/dL   Total Protein 9.2 (*) 6.0 - 8.3 g/dL   Albumin 3.8  3.5 - 5.2 g/dL   AST 18  0 - 37 U/L   ALT 18  0 - 53 U/L   Alkaline Phosphatase 60  39 - 117 U/L   Total Bilirubin 0.3  0.3 - 1.2 mg/dL   GFR calc non Af Amer 4 (*) >90 mL/min   GFR  calc Af Amer 5 (*) >90 mL/min   Comment: (NOTE)     The eGFR has been calculated using the CKD EPI equation.     This calculation has not been validated in all clinical situations.     eGFR's persistently <90 mL/min signify possible Chronic Kidney     Disease.  CBC WITH DIFFERENTIAL     Status: Abnormal   Collection Time    08/20/13  3:55 AM      Result Value Range   WBC 13.3 (*) 4.0 - 10.5 K/uL   RBC 3.56 (*) 4.22 - 5.81 MIL/uL   Hemoglobin 11.3 (*) 13.0 - 17.0 g/dL   HCT 34.7 (*) 39.0 - 52.0 %   MCV 97.5  78.0 - 100.0 fL   MCH 31.7  26.0 - 34.0 pg   MCHC 32.6  30.0 - 36.0 g/dL   RDW 13.7  11.5 - 15.5 %   Platelets 230  150 - 400 K/uL   Neutrophils Relative % 73  43 - 77 %   Neutro Abs 9.8 (*) 1.7 - 7.7 K/uL   Lymphocytes Relative 15  12 - 46 %   Lymphs Abs 2.0  0.7 - 4.0 K/uL   Monocytes Relative 10  3 - 12 %   Monocytes Absolute 1.4 (*) 0.1 - 1.0 K/uL   Eosinophils Relative 1  0 - 5 %   Eosinophils Absolute 0.1  0.0 - 0.7 K/uL   Basophils Relative 0  0 - 1 %   Basophils Absolute 0.0  0.0 - 0.1 K/uL  TSH     Status: None   Collection Time    08/20/13  3:55 AM      Result Value Range   TSH 0.642  0.350 - 4.500 uIU/mL   Comment: Performed at [facility]  PHOSPHORUS     Status: Abnormal   Collection Time    08/20/13  3:55 AM      Result Value Range   Phosphorus 11.3 (*) 2.3 - 4.6 mg/dL  MRSA PCR SCREENING     Status: None   Collection Time    08/20/13  5:55 AM      Result Value Range   MRSA by PCR NEGATIVE  NEGATIVE   Comment:            The GeneXpert MRSA Assay (FDA     approved for NASAL specimens     only), is one component of a     comprehensive MRSA colonization     surveillance program. It is not     intended to diagnose MRSA     infection nor to guide or     monitor treatment for  MRSA infections.  GLUCOSE, CAPILLARY     Status: Abnormal   Collection Time    08/20/13  7:59 AM      Result Value Range   Glucose-Capillary 280 (*) 70 - 99 mg/dL    Comment 1 Documented in Chart     Comment 2 Notify RN    BLOOD GAS, ARTERIAL     Status: Abnormal   Collection Time    08/20/13  9:18 AM      Result Value Range   FIO2 0.40     Delivery systems BILEVEL POSITIVE AIRWAY PRESSURE     Mode BILEVEL POSITIVE AIRWAY PRESSURE     Inspiratory PAP 12.0     Expiratory PAP 6.0     pH, Arterial 7.053 (*) 7.350 - 7.450   Comment: CRITICAL RESULT CALLED TO, READ BACK BY AND VERIFIED WITH:     Cherly Beach, RN AT [phone] BY Shea Stakes, RRT, RCP ON 08/20/2013   pCO2 arterial 95.4 (*) 35.0 - 45.0 mmHg   Comment: CRITICAL RESULT CALLED TO, READ BACK BY AND VERIFIED WITH:     Cherly Beach, RN AT [phone] BY Shea Stakes, RRT,RCP ON 08/20/2013   pO2, Arterial 86.9  80.0 - 100.0 mmHg   Bicarbonate 25.3 (*) 20.0 - 24.0 mEq/L   TCO2 28.3  0 - 100 mmol/L   Acid-base deficit 4.1 (*) 0.0 - 2.0 mmol/L   O2 Saturation 93.0     Patient temperature 98.6     Collection site LEFT RADIAL     Drawn by [phone]     Sample type ARTERIAL DRAW     Allens test (pass/fail) PASS  PASS  LACTIC ACID, PLASMA     Status: None   Collection Time    08/20/13 11:30 AM      Result Value Range   Lactic Acid, Venous 1.0  0.5 - 2.2 mmol/L  CBC     Status: Abnormal   Collection Time    08/20/13 11:31 AM      Result Value Range   WBC 12.2 (*) 4.0 - 10.5 K/uL   RBC 3.42 (*) 4.22 - 5.81 MIL/uL   Hemoglobin 10.5 (*) 13.0 - 17.0 g/dL   HCT 33.1 (*) 39.0 - 52.0 %   MCV 96.8  78.0 - 100.0 fL   MCH 30.7  26.0 - 34.0 pg   MCHC 31.7  30.0 - 36.0 g/dL   RDW 13.6  11.5 - 15.5 %   Platelets 217  150 - 400 K/uL  COMPREHENSIVE METABOLIC PANEL     Status: Abnormal   Collection Time    08/20/13 11:31 AM      Result Value Range   Sodium 132 (*) 135 - 145 mEq/L   Potassium 7.3 (*) 3.5 - 5.1 mEq/L   Comment: REPEATED TO VERIFY     CRITICAL RESULT CALLED TO, READ BACK BY AND VERIFIED WITH:     C.MCKEOWN,RN 08/20/13 1228 BY BSLADE     NO VISIBLE HEMOLYSIS   Chloride 89 (*) 96 - 112 mEq/L    CO2 28  19 - 32 mEq/L   Glucose, Bld 296 (*) 70 - 99 mg/dL   BUN 69 (*) 6 - 23 mg/dL   Creatinine, Ser 13.21 (*) 0.50 - 1.35 mg/dL   Calcium 8.6  8.4 - 10.5 mg/dL   Total Protein 8.6 (*) 6.0 - 8.3 g/dL   Albumin 3.6  3.5 - 5.2 g/dL   AST 20  0 - 37 U/L   ALT  18  0 - 53 U/L   Alkaline Phosphatase 54  39 - 117 U/L   Total Bilirubin 0.4  0.3 - 1.2 mg/dL   GFR calc non Af Amer 4 (*) >90 mL/min   GFR calc Af Amer 4 (*) >90 mL/min   Comment: (NOTE)     The eGFR has been calculated using the CKD EPI equation.     This calculation has not been validated in all clinical situations.     eGFR's persistently <90 mL/min signify possible Chronic Kidney     Disease.  MAGNESIUM     Status: None   Collection Time    08/20/13 11:31 AM      Result Value Range   Magnesium 2.4  1.5 - 2.5 mg/dL  PHOSPHORUS     Status: Abnormal   Collection Time    08/20/13 11:31 AM      Result Value Range   Phosphorus 12.7 (*) 2.3 - 4.6 mg/dL  PROTIME-INR     Status: None   Collection Time    08/20/13 11:31 AM      Result Value Range   Prothrombin Time 13.9  11.6 - 15.2 seconds   INR 1.09  0.00 - 1.49  TROPONIN I     Status: None   Collection Time    08/20/13 12:16 PM      Result Value Range   Troponin I <0.30  <0.30 ng/mL   Comment:            Due to the release kinetics of cTnI,     a negative result within the first hours     of the onset of symptoms does not rule out     myocardial infarction with certainty.     If myocardial infarction is still suspected,     repeat the test at appropriate intervals.  POCT I-STAT 3, BLOOD GAS (G3+)     Status: Abnormal   Collection Time    08/20/13 12:31 PM      Result Value Range   pH, Arterial 7.251 (*) 7.350 - 7.450   pCO2 arterial 65.6 (*) 35.0 - 45.0 mmHg   pO2, Arterial 332.0 (*) 80.0 - 100.0 mmHg   Bicarbonate 28.8 (*) 20.0 - 24.0 mEq/L   TCO2 31  0 - 100 mmol/L   O2 Saturation 100.0     Patient temperature 98.6 F     Collection site ARTERIAL LINE      Drawn by Operator     Sample type ARTERIAL     Comment NOTIFIED PHYSICIAN    GLUCOSE, CAPILLARY     Status: Abnormal   Collection Time    08/20/13  1:01 PM      Result Value Range   Glucose-Capillary 250 (*) 70 - 99 mg/dL  POTASSIUM     Status: Abnormal   Collection Time    08/20/13  3:06 PM      Result Value Range   Potassium 3.3 (*) 3.5 - 5.1 mEq/L    Dg Chest 2 View  08/19/2013   CLINICAL DATA:  Shortness of breath.  EXAM: CHEST  2 VIEW  COMPARISON:  05/01/2012  FINDINGS: Cardiomegaly with vascular congestion. Diffuse interstitial prominence could reflect early interstitial edema. No confluent opacities or effusions. No acute bony abnormality.  IMPRESSION: Cardiomegaly with vascular congestion and possible or interstitial edema.   Electronically Signed   By: Rolm Baptise M.D.   On: 08/19/2013 20:34   Dg Chest Port 1 View  08/20/2013  CLINICAL DATA:  Endotracheal tube placement  EXAM: PORTABLE CHEST - 1 VIEW  COMPARISON:  08/19/2013  FINDINGS: Endotracheal tube tip is low lying at the carina level and potentially entering the proximal right mainstem bronchus. Recommend retracting endotracheal tube tip by 3.5 cm. No secondary findings of esophageal intubation.  Right upper lobe subsegmental atelectasis. There may be a small amount of fluid within the right fissures.  Pulmonary vascular congestion/ pulmonary edema.  No gross pneumothorax.  Cardiomegaly.  IMPRESSION: Endotracheal tube tip is low lying at the carina level and potentially entering the proximal right mainstem bronchus. Recommend retracting endotracheal tube tip by 3.5 cm. No secondary findings of esophageal intubation.  Right upper lobe subsegmental atelectasis. There may be a small amount of fluid within the right fissures.  Pulmonary vascular congestion/ pulmonary edema.  Cardiomegaly.  These results were called by telephone at the time of interpretation on 03-Sep-2013 at 12:21 PM to [facility] patient's nurse, who verbally  acknowledged these results.   Electronically Signed   By: Chauncey Cruel M.D.   On: 2013/09/03 12:25   Dg Abd Portable 1v  09-03-2013   CLINICAL DATA:  Nasogastric tube place  EXAM: PORTABLE ABDOMEN - 1 VIEW  COMPARISON:  None.  FINDINGS: The nasogastric tube is noted coiled in the fundus of stomach. Air-filled nondilated small bowel loops are identified.  IMPRESSION: Nasogastric tube with distal tip coiled in the fundus of stomach.   Electronically Signed   By: Abelardo Diesel M.D.   On: 2013-09-03 14:38    Review of Systems  Unable to perform ROS: intubated   Blood pressure 106/51, pulse 82, temperature 99.2 F (37.3 C), temperature source Axillary, resp. rate 35, height 5\' 8"  (1.727 m), weight 154.6 kg (340 lb 13.3 oz), SpO2 97.00%. Physical Exam  Constitutional: He appears well-developed and well-nourished. No distress.  Morbidly obese.  Sedated.  HENT:  Head: Normocephalic and atraumatic.  Right Ear: External ear normal.  Left Ear: External ear normal.  Nose: Nose normal.  Mouth/Throat: Oropharynx is clear and moist.  Eyes: Conjunctivae are normal.  Neck: Neck supple.  #6.5 ETT in wound in lower central neck, secured with suture.  Cardiovascular: Normal rate.   Respiratory:  On ventilator.  GI:  Did not examine.  Genitourinary:  Did not examine.  Musculoskeletal:  Does not follow commands.  Neurological:  Sedated.  Skin: Skin is warm and dry.  Psychiatric:  Cannot be assessed.    Assessment/Plan: Respiratory failure s/p slash surgical airway. Having corrected his potassium level, we will proceed to the operating room for revision of his tracheostomy.  I left a telephone message with his mother explaining what was needed but she has not called me back at this point.  We will proceed without written consent due to the emergency nature of his problem.  His intubation is not stable and loss of the tube position would result in death.  Seth [name] September 03, 2013, 4:20 PM     I just spoke with his mother on the phone about his situation and discussed the plan.  She understands and voiced her agreement to proceed.

## 2013-08-20 NOTE — Progress Notes (Addendum)
PULMONARY  / CRITICAL CARE MEDICINE  Name: Seth [name] MRN: [id number] DOB: 08/23/1966    ADMISSION DATE:  08/19/2013 CONSULTATION DATE:  08/19/2013  REFERRING MD :  TRH/EDP PRIMARY SERVICE: TRH-->PCCM  CHIEF COMPLAINT:  Cough, congestion  BRIEF PATIENT DESCRIPTION: 47 y/o male with OSA, ESRD presented to the [facility] ED on 11/10 with fever, cough, and chest congestion.  In the ED he developed hypercapnic respiratory failure requiring BIPAP briefly.  PCCM asked to evaluate for ICU.  SIGNIFICANT EVENTS / STUDIES:  11/11 - Admit to [facility] with SOB, cough, fever, SOB. Decompensated on SDU requiring emergent airway   LINES / TUBES: R Fem TLC (clean) 11/11>>>  R Rad ALine 11/11>>>  OETT in Trach 11/>>>  CULTURES: BCx2 11/11>>>  UC 11/11>>>  ANTIBIOTICS: Cefepime 11/11>>>  Vanco 11/11>>>  SUBJECTIVE: Unable to breath and confused.  VITAL SIGNS: Temp:  [97.5 F (36.4 C)-100.4 F (38 C)] 97.5 F (36.4 C) (11/11 0747) Pulse Rate:  [75-111] 103 (11/11 1100) Resp:  [18-31] 30 (11/11 1100) BP: (105-139)/(50-93) 139/50 mmHg (11/11 0747) SpO2:  [79 %-100 %] 100 % (11/11 1100) FiO2 (%):  [100 %] 100 % (11/11 1100) Weight:  [339 lb 3 oz (153.854 kg)-341 lb 11.4 oz (155 kg)] 341 lb 11.4 oz (155 kg) (11/11 0226)  HEMODYNAMICS:    VENTILATOR SETTINGS: Vent Mode:  [-] PRVC FiO2 (%):  [100 %] 100 % Set Rate:  [30 bmp] 30 bmp Vt Set:  [450 mL] 450 mL PEEP:  [10 cmH20] 10 cmH20 Plateau Pressure:  [31 cmH20] 31 cmH20  INTAKE / OUTPUT: Intake/Output     11/10 0701 - 11/11 0700 11/11 0701 - 11/12 0700   P.O. 118    I.V. (mL/kg) 550 (3.5)    IV Piggyback 50    Total Intake(mL/kg) 718 (4.6)    Net +718           PHYSICAL EXAMINATION:  Gen: comfortable off of bipap HEENT: NCAT, PERRL EOMI, OP clear, ETT in trach site PULM: distant breath sounds, few scattered wheezes CV: RRR, distant heart sounds AB: BS+, soft, nontender Ext: warm, trace edema Derm: multiple tattoos, no  rash Neuro: A&Ox4, maew  LABS:  CBC  Recent Labs Lab 08/19/13 1901 08/20/13 0355 08/20/13 1131  WBC 12.5* 13.3* 12.2*  HGB 11.5* 11.3* 10.5*  HCT 34.9* 34.7* 33.1*  PLT 238 230 217   Coag's  Recent Labs Lab 08/20/13 1131  INR 1.09   BMET  Recent Labs Lab 08/19/13 1901 08/20/13 0355 08/20/13 1131  NA 130* 130* 132*  K 5.4* 6.0* PENDING  CL 86* 89* 89*  CO2 25 24 28   BUN 55* 63* 69*  CREATININE 10.88* 12.27* 13.21*  GLUCOSE 248* 313* 296*   Electrolytes  Recent Labs Lab 08/19/13 1901 08/20/13 0355 08/20/13 1131  CALCIUM 8.8 8.4 8.6  MG  --   --  2.4  PHOS  --  11.3* 12.7*   Sepsis Markers  Recent Labs Lab 08/19/13 2245 08/19/13 2312 08/20/13 1130  LATICACIDVEN 0.7 1.00 1.0   ABG  Recent Labs Lab 08/19/13 2154 08/20/13 0918  PHART 7.261* 7.053*  PCO2ART 61.5* 95.4*  PO2ART 77.0* 86.9   Liver Enzymes  Recent Labs Lab 08/19/13 1901 08/20/13 0355 08/20/13 1131  AST 17 18 20   ALT 19 18 18   ALKPHOS 61 60 54  BILITOT 0.3 0.3 0.4  ALBUMIN 4.1 3.8 3.6   Cardiac Enzymes  Recent Labs Lab 08/19/13 2012  PROBNP 5311.0*   Glucose  Recent  Labs Lab 08/19/13 1901 08/20/13 0759  GLUCAP 219* 280*   Imaging Dg Chest 2 View  08/19/2013   CLINICAL DATA:  Shortness of breath.  EXAM: CHEST  2 VIEW  COMPARISON:  05/01/2012  FINDINGS: Cardiomegaly with vascular congestion. Diffuse interstitial prominence could reflect early interstitial edema. No confluent opacities or effusions. No acute bony abnormality.  IMPRESSION: Cardiomegaly with vascular congestion and possible or interstitial edema.   Electronically Signed   By: Rolm Baptise M.D.   On: 08/19/2013 20:34   CXR: Cardiomegally, interstitial edema  ASSESSMENT / PLAN:  PULMONARY A:  Acute on Chronic Hypercapnic Respiratory Failure - baseline CO2 likely approx 50 based on ABG.  Difficult Airway - ! S/p emergent bedside trach Obesity hypoventilation syndrome  Pulmonary edema and with  cough/bronchitis -  suspect all this is contributing to the slight worsening in his hypercapnea   Acute tracheobronchitis P:   - Full vent support, paralytics if needed to maintain vent synchrony while ETT in stoma. - ENT consulted to establish / explore airway in OR & trach placement, pending OR at 3PM. - ABG now. - PRN BD's. - Sedation protocol. - See ID.  CARDIOVASCULAR A:  Pulmonary edema - likely due to volume overload from ESRD, consider diastolic CHF Transient Hypotension - related to sedation / resolved.  P:  - Nephrology Consult. - Likely will need HD 11/11. - Hold norvasc. - Neosynephrine for MAP >65.   RENAL A:   ESRD Hyperkalemia Metabolic Acidosis P:   - HD 11/11 today. - F/U ABG now. - Repeat labs pending.  INFECTIOUS A:   Acute Tracheobronchitis P:   - Flu studies pending. - Cultures as above. - Abx as above.  GI A: Morbid Obesity P: - NPO. - PPI.  ENDOCRINE A: Diabetes Mellitus P: - SSI.  NEUROLOGIC A:   Acute encephalopathy - initially in of setting of hypercapnea.  Then post difficult airway with sedation. P:   - Monitor mental status. - Supportive care  Noe Gens, NP-C [facility] Pgr: [phone] or [phone]  08/20/2013, 12:20 PM  Please see note regarding intubation conditions.  ENT called to explore neck and place proper trach.    Total CC time of 120 min excluding procedure.  Patient seen and examined, agree with above note.  I dictated the care and orders written for this patient under my direction.  Rush Farmer, MD [phone]

## 2013-08-20 NOTE — Progress Notes (Signed)
Inpatient Diabetes Program Recommendations  AACE/ADA: New Consensus Statement on Inpatient Glycemic Control (2013)  Target Ranges:  Prepandial:   less than 140 mg/dL      Peak postprandial:   less than 180 mg/dL (1-2 hours)      Critically ill patients:  140 - 180 mg/dL  Results for TARUS, ENTIN (MRN [id number]) as of 08/20/2013 09:37  Ref. Range 08/19/2013 19:01 08/20/2013 07:59  Glucose-Capillary Latest Range: 70-99 mg/dL 219 (H) 280 (H)   Inpatient Diabetes Program Recommendations Insulin - Basal: consider low dose basal Levemir 10 units  Thank you  Raoul Pitch BSN, RN,CDE Inpatient Diabetes Coordinator [phone] (team pager)

## 2013-08-20 NOTE — ED Notes (Signed)
pending bed assignment/ clean ready room.  While off Bipap, pt given happy meal. Given [place] sandwich, apple sauce, and apple juice.

## 2013-08-20 NOTE — Progress Notes (Signed)
Called by patient's primary for respiratory failure with a pH of 7.0.  Patient was obtunded.  Patient was moved down to the ICU.  Attempted on multiple occassions to intubate with great deal of difficulty.  Even paralyzed the patient was unable to open mouth properly, tongue, teeth and redundant tissue was very enlarged and patient's body habitus was poor.  After multiple attempts anesthesia was called however in the meantime, patient starting becoming bradycardic and decision was made to place an emergent surgical airway.  See Dr. Janit Pagan note for details on surgical airway placement.  Attempted bronchoscopic intubation but was unable to insert tube with bleeding.  Rush Farmer, M.D. [name] Pulmonary/Critical Care Medicine. Pager: [phone]. After hours pager: [phone].

## 2013-08-21 ENCOUNTER — Inpatient Hospital Stay (HOSPITAL_COMMUNITY): Payer: Medicare Other

## 2013-08-21 ENCOUNTER — Encounter (HOSPITAL_COMMUNITY): Payer: Self-pay | Admitting: Otolaryngology

## 2013-08-21 DIAGNOSIS — J9819 Other pulmonary collapse: Secondary | ICD-10-CM | POA: Diagnosis not present

## 2013-08-21 DIAGNOSIS — J9600 Acute respiratory failure, unspecified whether with hypoxia or hypercapnia: Secondary | ICD-10-CM | POA: Diagnosis not present

## 2013-08-21 DIAGNOSIS — D631 Anemia in chronic kidney disease: Secondary | ICD-10-CM | POA: Diagnosis not present

## 2013-08-21 DIAGNOSIS — I359 Nonrheumatic aortic valve disorder, unspecified: Secondary | ICD-10-CM

## 2013-08-21 DIAGNOSIS — Z4682 Encounter for fitting and adjustment of non-vascular catheter: Secondary | ICD-10-CM | POA: Diagnosis not present

## 2013-08-21 DIAGNOSIS — N186 End stage renal disease: Secondary | ICD-10-CM | POA: Diagnosis not present

## 2013-08-21 DIAGNOSIS — T889XXS Complication of surgical and medical care, unspecified, sequela: Secondary | ICD-10-CM

## 2013-08-21 DIAGNOSIS — I120 Hypertensive chronic kidney disease with stage 5 chronic kidney disease or end stage renal disease: Secondary | ICD-10-CM | POA: Diagnosis not present

## 2013-08-21 LAB — CBC
Hemoglobin: 9.5 g/dL — ABNORMAL LOW (ref 13.0–17.0)
MCH: 30.7 pg (ref 26.0–34.0)
MCV: 94.2 fL (ref 78.0–100.0)
RBC: 3.09 MIL/uL — ABNORMAL LOW (ref 4.22–5.81)
RDW: 13.6 % (ref 11.5–15.5)

## 2013-08-21 LAB — URINE CULTURE: Culture: NO GROWTH

## 2013-08-21 LAB — BASIC METABOLIC PANEL
BUN: 60 mg/dL — ABNORMAL HIGH (ref 6–23)
CO2: 25 mEq/L (ref 19–32)
Calcium: 8.1 mg/dL — ABNORMAL LOW (ref 8.4–10.5)
Chloride: 93 mEq/L — ABNORMAL LOW (ref 96–112)
Creatinine, Ser: 12.13 mg/dL — ABNORMAL HIGH (ref 0.50–1.35)
GFR calc Af Amer: 5 mL/min — ABNORMAL LOW (ref >90)
GFR calc non Af Amer: 4 mL/min — ABNORMAL LOW (ref >90)

## 2013-08-21 LAB — POCT I-STAT 3, ART BLOOD GAS (G3+)
pCO2 arterial: 55.9 mmHg — ABNORMAL HIGH (ref 35.0–45.0)
pH, Arterial: 7.349 — ABNORMAL LOW (ref 7.350–7.450)
pO2, Arterial: 97 mmHg (ref 80.0–100.0)

## 2013-08-21 LAB — GLUCOSE, CAPILLARY
Glucose-Capillary: 156 mg/dL — ABNORMAL HIGH (ref 70–99)
Glucose-Capillary: 153 mg/dL — ABNORMAL HIGH (ref 70–99)
Glucose-Capillary: 168 mg/dL — ABNORMAL HIGH (ref 70–99)
Glucose-Capillary: 103 mg/dL — ABNORMAL HIGH (ref 70–99)
Glucose-Capillary: 125 mg/dL — ABNORMAL HIGH (ref 70–99)

## 2013-08-21 LAB — MAGNESIUM: Magnesium: 1.9 mg/dL (ref 1.5–2.5)

## 2013-08-21 LAB — TROPONIN I: Troponin I: <0.3 ng/mL (ref <0.30)

## 2013-08-21 LAB — VANCOMYCIN, RANDOM: Vancomycin Rm: 39 ug/mL

## 2013-08-21 MED ORDER — DARBEPOETIN ALFA-POLYSORBATE 40 MCG/0.4ML IJ SOLN
40.0000 ug | INTRAMUSCULAR | Status: DC
  Administered 2013-08-21: 40 ug via INTRAVENOUS
  Filled 2013-08-21 (×2): qty 0.4

## 2013-08-21 MED ORDER — ACETAMINOPHEN 160 MG/5ML PO SOLN: 650.0000 mg | Freq: Four times a day (QID) | ORAL | Status: DC | PRN

## 2013-08-21 MED ORDER — HEPARIN SODIUM (PORCINE) 5000 UNIT/ML IJ SOLN
5000.0000 [IU] | Freq: Three times a day (TID) | INTRAMUSCULAR | Status: DC
  Administered 2013-08-21 – 2013-08-28 (×21): 5000 [IU] via SUBCUTANEOUS
  Filled 2013-08-21 (×26): qty 1

## 2013-08-21 MED ORDER — DARBEPOETIN ALFA-POLYSORBATE 40 MCG/0.4ML IJ SOLN: 40.0000 ug | INTRAMUSCULAR | Status: DC

## 2013-08-21 MED ORDER — PERFLUTREN LIPID MICROSPHERE
1.0000 mL | INTRAVENOUS | Status: AC | PRN
  Administered 2013-08-21: 2 mL via INTRAVENOUS
  Filled 2013-08-21: qty 10

## 2013-08-21 NOTE — Progress Notes (Signed)
Fentanyl 200cc wasted via sink. Witnessed by Emmaline Kluver, RN Versed 25 cc wasted via sink. Witnessed by Emmaline Kluver, RN

## 2013-08-21 NOTE — Progress Notes (Signed)
RT attempted ATC at this time. Pt did not tolerate. Pt became very anxious and started to desat to 74% RT increased Fio2 and pt Spo2 did not rise with change of Fio2. Pt was then placed back on the vent on PS/CPAP at 50% and PS of 16, PEEP of 5. Pt had low volumes but is tolerating well. MD made aware. RT will continue to monitor.

## 2013-08-21 NOTE — Progress Notes (Signed)
Report called to receiving nurse via phone (2C10). Past medical history and present hospitalization reported. All questions answered at this time. HD in progress. Will transfer via bed with all belongings post HD treatment.

## 2013-08-21 NOTE — Progress Notes (Signed)
SLP Cancellation Note  Patient Details Name: Seth [name] MRN: [id number] DOB: December 23, 1965   Cancelled evaluation:      Pt with trach; remains on ventilator.  Will review progress and evaluate for PMSV per Dr. Titus Mould when warranted.      Juan Quam Laurice 08/21/2013, 3:52 PM

## 2013-08-21 NOTE — Progress Notes (Signed)
[facility] PROGRESS NOTE  NASHAWN DEALMEIDA [id number] DOB: [birth year] DOA: 08/19/2013 PCP: [facility] Events of 11/11 noted, patient found to be in acute hypercarbic hypoxemic respiratory failure-pH of 7.053 PCCM consulted and he was admitted to ICU>> discussed with Dr. Titus Mould this morning and he is on his service at this time. Appreciate the assistance, please call at [facility] as needed.   [facility] Pager [phone]. If 7PM-7AM, please contact night-coverage at [email or web address], password [facility] 08/21/2013, 9:46 AM  LOS: 2 days

## 2013-08-21 NOTE — Anesthesia Postprocedure Evaluation (Signed)
  Anesthesia Post-op Note  Patient: Seth [name]  Procedure(s) Performed: Procedure(s): TRACHEOSTOMY Revision (N/A)  Patient Location: ICU  Anesthesia Type General  Level of Consciousness: sedated  Airway and Oxygen Therapy: Patient placed on Ventilator (see vital sign flow sheet for setting)  Post-op Pain: none  Post-op Assessment: Post-op Vital signs reviewed, Patient's Cardiovascular Status Stable, Respiratory Function Stable, Patent Airway, No signs of Nausea or vomiting and Pain level controlled  Post-op Vital Signs: Reviewed and stable  Complications: No apparent anesthesia complications

## 2013-08-21 NOTE — Progress Notes (Signed)
Mother made aware of transfer to 2C10 via phone by patients aunt. Nurse at bedside verifying correct room number for transfer.

## 2013-08-21 NOTE — Procedures (Signed)
I was present at this dialysis session. I have reviewed the session itself and made appropriate changes.   Pt at beginning of Tx.  Critline relatively flat and UF goal of 6L written for over a 5h treatment.  SBP in 120s.  Using AVF.    [name]  MD 08/21/2013, 1:47 PM

## 2013-08-21 NOTE — Progress Notes (Signed)
Hemodialysis Tx complete. Goal of 6L not met due to pt cramping and wanting to come out of UF. VS remained stable throughout Tx and resp status improved. Ran for 5hr with a final UF of [id number]

## 2013-08-21 NOTE — Progress Notes (Signed)
PULMONARY  / CRITICAL CARE MEDICINE  Name: Seth [name] MRN: [id number] DOB: 1966-08-13    ADMISSION DATE:  08/19/2013 CONSULTATION DATE:  08/19/2013  REFERRING MD :  TRH/EDP PRIMARY SERVICE: TRH-->PCCM  CHIEF COMPLAINT:  Cough, congestion  BRIEF PATIENT DESCRIPTION: 47 y/o male with OSA, ESRD presented to the [facility] ED on 11/10 with fever, cough, and chest congestion.  In the ED he developed hypercapnic respiratory failure requiring BIPAP briefly.  PCCM asked to evaluate for ICU.  SIGNIFICANT EVENTS / STUDIES:  11/11 - Admit to [facility] with SOB, cough, fever, SOB. Decompensated on SDU requiring emergent surgical  airway 11/11 >> OR for revision of tracheostomy, hyperkalemia 11/12>>>awake, alert, cooperative  LINES / TUBES: R Fem TLC (clean) 11/11>>>  R Rad ALine 11/11>>>  OETT in Trach 11/11>>>  CULTURES: BCx2 11/11>>>  UC 11/11>>>  ANTIBIOTICS: Cefepime 11/11>>>  Vanco 11/11>>>  SUBJECTIVE: Pt on vent s/p emergent airway for respiratory distress. Revised trach successful  VITAL SIGNS: Temp:  [98.9 F (37.2 C)-100.3 F (37.9 C)] 99.4 F (37.4 C) (11/12 0410) Pulse Rate:  [70-111] 86 (11/12 0700) Resp:  [14-35] 35 (11/12 0700) BP: (90-171)/(44-101) 96/44 mmHg (11/12 0320) SpO2:  [91 %-100 %] 96 % (11/12 0700) Arterial Line BP: (70-187)/(42-78) 99/78 mmHg (11/12 0700) FiO2 (%):  [30 %-100 %] 40 % (11/12 0320) Weight:  [339 lb 1.1 oz (153.8 kg)-341 lb 11.4 oz (155 kg)] 341 lb 11.4 oz (155 kg) (11/12 0500)  HEMODYNAMICS:    VENTILATOR SETTINGS: Vent Mode:  [-] PRVC FiO2 (%):  [30 %-100 %] 40 % Set Rate:  [30 bmp-35 bmp] 35 bmp Vt Set:  [450 mL] 450 mL PEEP:  [5 cmH20-10 cmH20] 5 cmH20 Plateau Pressure:  [22 cmH20-31 cmH20] 22 cmH20  INTAKE / OUTPUT: Intake/Output     11/11 0701 - 11/12 0700 11/12 0701 - 11/13 0700   P.O.     I.V. (mL/kg) 1559 (10.1)    IV Piggyback 250    Total Intake(mL/kg) 1809 (11.7)    Other 754    Blood 15    Total Output 769     Net  +1040           PHYSICAL EXAMINATION:  Gen: Trach in place, on vent  HEENT: NCAT, ETT in trach site PULM: minimal wheezing bibasilar, good air movement  CV: RRR, distant heart sounds AB: BS+, soft, nontender Ext: warm, trace edema Derm: no rash Neuro: on Vent, rass 0, nonfocal exam, Alert, Ox 3  LABS:  CBC  Recent Labs Lab 08/20/13 0355 08/20/13 1131 08/21/13 0500  WBC 13.3* 12.2* 13.4*  HGB 11.3* 10.5* 9.5*  HCT 34.7* 33.1* 29.1*  PLT 230 217 200   Coag's  Recent Labs Lab 08/20/13 1131  INR 1.09   BMET  Recent Labs Lab 08/20/13 0355 08/20/13 1131 08/20/13 1506 08/21/13 0500  NA 130* 132*  --  137  K 6.0* 7.3* 3.3* 4.3  CL 89* 89*  --  93*  CO2 24 28  --  25  BUN 63* 69*  --  60*  CREATININE 12.27* 13.21*  --  12.13*  GLUCOSE 313* 296*  --  157*   Electrolytes  Recent Labs Lab 08/20/13 0355 08/20/13 1131 08/21/13 0500  CALCIUM 8.4 8.6 8.1*  MG  --  2.4 1.9  PHOS 11.3* 12.7* 6.1*   Sepsis Markers  Recent Labs Lab 08/19/13 2245 08/19/13 2312 08/20/13 1130  LATICACIDVEN 0.7 1.00 1.0   ABG  Recent Labs Lab 08/20/13 0918 08/20/13  1231 08/20/13 2224  PHART 7.053* 7.251* 7.387  PCO2ART 95.4* 65.6* 47.3*  PO2ART 86.9 332.0* 86.0   Liver Enzymes  Recent Labs Lab 08/19/13 1901 08/20/13 0355 08/20/13 1131  AST 17 18 20   ALT 19 18 18   ALKPHOS 61 60 54  BILITOT 0.3 0.3 0.4  ALBUMIN 4.1 3.8 3.6   Cardiac Enzymes  Recent Labs Lab 08/19/13 2012 08/20/13 0016 08/20/13 1216  TROPONINI  --  <0.30 <0.30  PROBNP 5311.0*  --   --    Glucose  Recent Labs Lab 08/20/13 0759 08/20/13 1301 08/20/13 1626 08/20/13 1937 08/21/13 0036 08/21/13 0410  GLUCAP 280* 250* 167* 152* 158* 156*   Imaging Dg Chest 2 View  08/19/2013   CLINICAL DATA:  Shortness of breath.  EXAM: CHEST  2 VIEW  COMPARISON:  05/01/2012  FINDINGS: Cardiomegaly with vascular congestion. Diffuse interstitial prominence could reflect early interstitial edema. No  confluent opacities or effusions. No acute bony abnormality.  IMPRESSION: Cardiomegaly with vascular congestion and possible or interstitial edema.   Electronically Signed   By: Rolm Baptise M.D.   On: 08/19/2013 20:34   Dg Chest Port 1 View  08/21/2013   CLINICAL DATA:  Tracheostomy, ventilatory support  EXAM: PORTABLE CHEST - 1 VIEW  COMPARISON:  08/20/2013  FINDINGS: Tracheostomy is in the mid trachea at the T3 level, well above the carina. NG tube within the stomach. Cardiomegaly present with vascular congestion and basilar atelectasis. Interval improvement in the right upper lobe atelectasis. No enlarging effusion or pneumothorax.  IMPRESSION: Endotracheal tube has been retracted and is in the midtrachea approximately 5 cm above the carina.  Resolving right upper lobe atelectasis  Stable cardiomegaly with vascular congestion and residual basilar atelectasis   Electronically Signed   By: Daryll Brod M.D.   On: 08/21/2013 07:40   Dg Chest Port 1 View  08/20/2013   CLINICAL DATA:  Endotracheal tube placement  EXAM: PORTABLE CHEST - 1 VIEW  COMPARISON:  08/19/2013  FINDINGS: Endotracheal tube tip is low lying at the carina level and potentially entering the proximal right mainstem bronchus. Recommend retracting endotracheal tube tip by 3.5 cm. No secondary findings of esophageal intubation.  Right upper lobe subsegmental atelectasis. There may be a small amount of fluid within the right fissures.  Pulmonary vascular congestion/ pulmonary edema.  No gross pneumothorax.  Cardiomegaly.  IMPRESSION: Endotracheal tube tip is low lying at the carina level and potentially entering the proximal right mainstem bronchus. Recommend retracting endotracheal tube tip by 3.5 cm. No secondary findings of esophageal intubation.  Right upper lobe subsegmental atelectasis. There may be a small amount of fluid within the right fissures.  Pulmonary vascular congestion/ pulmonary edema.  Cardiomegaly.  These results were  called by telephone at the time of interpretation on 08/20/2013 at 12:21 PM to [facility] patient's nurse, who verbally acknowledged these results.   Electronically Signed   By: Chauncey Cruel M.D.   On: 08/20/2013 12:25   Dg Abd Portable 1v  08/20/2013   CLINICAL DATA:  Nasogastric tube place  EXAM: PORTABLE ABDOMEN - 1 VIEW  COMPARISON:  None.  FINDINGS: The nasogastric tube is noted coiled in the fundus of stomach. Air-filled nondilated small bowel loops are identified.  IMPRESSION: Nasogastric tube with distal tip coiled in the fundus of stomach.   Electronically Signed   By: Abelardo Diesel M.D.   On: 08/20/2013 14:38    ASSESSMENT / PLAN:  PULMONARY A:  Acute on Chronic Hypercapnic Respiratory Failure -  baseline CO2 likely approx 50 based on ABG.   Difficult Airway - ! S/p emergent bedside trach 11/11, revised by ENT 11/11 Obesity hypoventilation syndrome  Pulmonary edema and with cough/bronchitis  Acute tracheobronchitis P:   - on baseline MV, abg wnl -consider trach collar as likely edema improved and osa driving factors -likely life lon trach unless wt loss - PRN BD's. -consider further neg balance on HD per renal -pcxr in am for fluid status-abg on 2-4 hrs TC -goal if TC successful is speech, pmv, cuff down  CARDIOVASCULAR A: trop neg Pulmonary edema - likely due to volume overload from ESRD, consider diastolic CHF P:  - Hold norvasc - Consider 2 D Echo when stable -consider a line after abg on TC    RENAL A:   ESRD Hyperkalemia Metabolic Acidosis P:   - Renal Consult, appreciate recs - F/U ABG on TC -pcxr for volume -KVO  INFECTIOUS A:   Acute Tracheobronchitis Emergent surgical airway without significant prep P:   - Cultures as above. - Abx as above., nosocomial, will narrow -will likely give 5 days abx  GI A: Morbid Obesity P: - NPO -to trach collar, then slp if successful - PPI.  ENDOCRINE A: Diabetes Mellitus P: - SSI Ensure tsh  wnl  NEUROLOGIC A:   Acute encephalopathy - resolving  P:   - Monitor mental status. - Supportive care -dc all sedation, wua Fent prn  Heme: dvt prevention, re add sub q hep Cbc in am   Bryan R. Hess, DO of [facility] 08/21/2013, 8:03 AM  I have fully examined this patient and agree with above findings.    And edited in full  Ccm time 30 min   Lavon Paganini. Titus Mould, MD, [place] Pgr: [place] [facility]

## 2013-08-21 NOTE — Progress Notes (Signed)
ANTIBIOTIC CONSULT NOTE - FOLLOW UP  Pharmacy Consult for Vancomycin Indication: rule out sepsis  No Known Allergies Patient Measurements: Height: 5\' 8"  (172.7 cm) Weight: 341 lb 11.4 oz (155 kg) IBW/kg (Calculated) : 68.4 Vital Signs: Temp: 99.4 F (37.4 C) (11/12 0410) Temp src: Axillary (11/12 0410) BP: 96/44 mmHg (11/12 0320) Pulse Rate: 86 (11/12 0700) Intake/Output from previous day: 11/11 0701 - 11/12 0700 In: 1809 [I.V.:1559; IV Piggyback:250] Out: 769 [Blood:15] Labs:  Recent Labs  08/20/13 0355 08/20/13 1131 08/21/13 0500  WBC 13.3* 12.2* 13.4*  HGB 11.3* 10.5* 9.5*  PLT 230 217 200  CREATININE 12.27* 13.21* 12.13*   Estimated Creatinine Clearance: 10.7 ml/min (by C-G formula based on Cr of 12.13). Microbiology: Recent Results (from the past 720 hour(s))  URINE CULTURE     Status: None   Collection Time    08/19/13 10:54 PM      Result Value Range Status   Specimen Description URINE, RANDOM   Final   Special Requests CX ADDED AT 2329 ON [id number]   Final   Culture  Setup Time     Final   Value: 08/19/2013 23:45     Performed at [place]     Final   Value: NO GROWTH     Performed at [facility]   Report Status 08/21/2013 FINAL   Final  MRSA PCR SCREENING     Status: None   Collection Time    08/20/13  5:55 AM      Result Value Range Status   MRSA by PCR NEGATIVE  NEGATIVE Final   Comment:            The GeneXpert MRSA Assay (FDA     approved for NASAL specimens     only), is one component of a     comprehensive MRSA colonization     surveillance program. It is not     intended to diagnose MRSA     infection nor to guide or     monitor treatment for     MRSA infections.    Anti-infectives   Start     Dose/Rate Route Frequency Ordered Stop   08/21/13 1200  vancomycin (VANCOCIN) 1,250 mg in sodium chloride 0.9 % 250 mL IVPB  Status:  Discontinued     1,250 mg 250 mL/hr over 60 Minutes Intravenous Every M-W-F  (Hemodialysis) 08/19/13 2231 08/21/13 0741   08/20/13 1415  ceFEPIme (MAXIPIME) 2 g in dextrose 5 % 50 mL IVPB     2 g 100 mL/hr over 30 Minutes Intravenous  Once 08/20/13 1406 08/20/13 1627   08/20/13 1415  vancomycin (VANCOCIN) IVPB 1000 mg/200 mL premix     1,000 mg 200 mL/hr over 60 Minutes Intravenous  Once 08/20/13 1406 08/20/13 1740   08/20/13 0300  ceFEPIme (MAXIPIME) 2 g in dextrose 5 % 50 mL IVPB     2 g 100 mL/hr over 30 Minutes Intravenous Every M-W-F (2000) 08/20/13 0246     08/19/13 2230  vancomycin (VANCOCIN) 2,500 mg in sodium chloride 0.9 % 500 mL IVPB     2,500 mg 250 mL/hr over 120 Minutes Intravenous  Once 08/19/13 2222 08/20/13 0146   08/19/13 2215  piperacillin-tazobactam (ZOSYN) IVPB 3.375 g     3.375 g 100 mL/hr over 30 Minutes Intravenous  Once 08/19/13 2205 08/20/13 0149     Assessment: 55 YOM s/p emergent difficult intubation who went to OR yesterday for trach. He is also  with ESRD s/p HD yesterday. He is on empiric vanc + cefepime. A random vanc was checked this AM and is elevated 39. Pts Tmax is 100.3 and WNC is 13.4.  Cefepime 11/11 >> Vanc 11/11 >>   11/11 Blood >> pending 11/11 Urine >> NEG  11/12 VR = 39  Goal of Therapy:  Pre-HD Vancomycin level 15-25 mcg/ml  Plan:  1. Hold vancomycin today 2. F/u HD plans 3. Will reorder a vanc level depending up on renal plans  Salome Arnt, PharmD, BCPS Pager # [phone] 08/21/2013 7:43 AM

## 2013-08-21 NOTE — Progress Notes (Signed)
Echocardiogram 2D Echocardiogram with Definity has been performed.  Seth [name] 08/21/2013, 12:04 PM

## 2013-08-21 NOTE — Care Management Note (Signed)
  Page 1 of 1   08/21/2013     3:52:47 PM   CARE MANAGEMENT NOTE 08/21/2013  Patient:  Seth [name], Seth [name]   Account Number:  [id number]  Date Initiated:  08/20/2013  Documentation initiated by:  Luz Lex  Subjective/Objective Assessment:   SOB - requiring intubation and emergent trach.     Action/Plan:   Anticipated DC Date:  08/27/2013   Anticipated DC Plan:  LONG TERM ACUTE CARE (LTAC)      DC Planning Services  CM consult      Choice offered to / List presented to:             Status of service:  In process, will continue to follow Medicare Important Message given?   (If response is "NO", the following Medicare IM given date fields will be blank) Date Medicare IM given:   Date Additional Medicare IM given:    Discharge Disposition:    Per UR Regulation:  Reviewed for med. necessity/level of care/duration of stay  If discussed at [place] of Stay Meetings, dates discussed:    Comments:  ContactDenyse Amass Mother [phone] - disconnected                                      New number [phone]  08-21-13 2:45am Luz Lex, RNBSN [phone] Phone listed above for mother has been disconnected.  Aunt who is here states she is hospital.  Awaiting for her to return to room. Discussed Lt[facility] option for discharge with physician - physician agrees with good candidate.  [facility]. [facility] B[facility]. Talked with patient  Trach/vent - understands - able to write notes.  Communicated by note that he would like [place] to call and talk to his mother.  Received new number to reach mother from patient.  Message left for call back.

## 2013-08-21 NOTE — Progress Notes (Signed)
Admit: 08/19/2013 LOS: 2  68M ESRD, morbid obesity, admitted with hypercapneic RF, volume overload, required emergent surgical airway (11/11).    Subjective:  Tracheostomy in OR yesterday Today pt awake and alert, interacting with staff Brief HD yesterday to treat hyperkalemia before OR  On vanc/cefepime Normotensive, not on pressors  11/11 0701 - 11/12 0700 In: 1809 [I.V.:1559; IV Piggyback:250] Out: 769 [Blood:15]  Filed Weights   08/20/13 1357 08/20/13 1630 08/21/13 0500  Weight: 154.6 kg (340 lb 13.3 oz) 153.8 kg (339 lb 1.1 oz) 155 kg (341 lb 11.4 oz)    Current meds: reviewed Current Labs: reviewed  Outpt HD Orders  Unit: [place] MWF Time: 5h 36min  Dialyzer: F200  EDW: 148.5kg  K/Ca: 2k/2.25Ca  Access: AVF RFA  BFR: 550  UF Proflie: none  VDRA: Hectorol 10 qTx  EPO: 3000 qTx  IV Fe: Venofer 50 qWk   Physical Exam:  Blood pressure 96/44, pulse 86, temperature 99.4 F (37.4 C), temperature source Axillary, resp. rate 35, height 5\' 8"  (1.727 m), weight 155 kg (341 lb 11.4 oz), SpO2 96.00%. GEN: Morbidly obese, awake and interactive ENT: Trach in place CV: RRR, nl s1s2 LUNGS: coarse bs b/l ABD: obese, soft NEURO: interactive, mae, nf EXT: RFA AVF +b/t EYES: EOMI, muddy sclera  Assessment/Plan 1. ESRD on HD MWF: Plan for full HD today.  Goal EDW 148.5 and will target that but might take more than one session to obtain.  Likely will need to be challenged as well.   2. Anemia of CKD: Cont ESA as aranesp 40 qwk, give today 3. HTN/Vol: as above.  Outpt EDW will be challenged as able in house 4. Hypercapneif RF s/p tracheostomy and URI: per CCM and ENT.  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  Not eating currently.      [name] MD 08/21/2013, 8:00 AM   Recent Labs Lab 08/20/13 0355 08/20/13 1131 08/20/13 1506 08/21/13 0500  NA 130* 132*  --  137  K 6.0* 7.3* 3.3* 4.3  CL 89* 89*  --  93*  CO2 24 28  --  25  GLUCOSE 313* 296*  --  157*  BUN 63*  69*  --  60*  CREATININE 12.27* 13.21*  --  12.13*  CALCIUM 8.4 8.6  --  8.1*  PHOS 11.3* 12.7*  --  6.1*    Recent Labs Lab 08/20/13 0355 08/20/13 1131 08/21/13 0500  WBC 13.3* 12.2* 13.4*  NEUTROABS 9.8*  --   --   HGB 11.3* 10.5* 9.5*  HCT 34.7* 33.1* 29.1*  MCV 97.5 96.8 94.2  PLT 230 217 200    Current Facility-Administered Medications  Medication Dose Route Frequency Provider Last Rate Last Dose  . 0.9 %  sodium chloride infusion  100 mL Intravenous PRN Rexene Agent, MD      . 0.9 %  sodium chloride infusion  100 mL Intravenous PRN Rexene Agent, MD      . acetaminophen (TYLENOL) tablet 650 mg  650 mg Oral Q6H PRN [name], MD       Or  . acetaminophen (TYLENOL) suppository 650 mg  650 mg Rectal Q6H PRN [name], MD      . antiseptic oral rinse (BIOTENE) solution 15 mL  15 mL Mouth Rinse QID Juanito Doom, MD   15 mL at 08/21/13 0400  . ceFEPIme (MAXIPIME) 2 g in dextrose 5 % 50 mL IVPB  2 g Intravenous Q M,W,F-2000  Rogue Bussing, RPH   2 g at 08/20/13 0346  . chlorhexidine (PERIDEX) 0.12 % solution 15 mL  15 mL Mouth Rinse BID Juanito Doom, MD   15 mL at 08/21/13 0752  . fentaNYL (SUBLIMAZE) 10 mcg/mL in sodium chloride 0.9 % 250 mL infusion  0-400 mcg/hr Intravenous Continuous Rush Farmer, MD 20 mL/hr at 08/20/13 2000 200 mcg/hr at 08/20/13 2000  . fentaNYL (SUBLIMAZE) bolus via infusion 50-100 mcg  50-100 mcg Intravenous Q1H PRN Rush Farmer, MD   100 mcg at 08/20/13 1315  . heparin injection 1,000 Units  1,000 Units Dialysis PRN Rexene Agent, MD      . heparin injection 15,500 Units  100 Units/kg Dialysis PRN Rexene Agent, MD      . insulin aspart (novoLOG) injection 0-9 Units  0-9 Units Subcutaneous TID WC [name], MD   3 Units at 08/20/13 1230  . lidocaine (PF) (XYLOCAINE) 1 % injection 5 mL  5 mL Intradermal PRN Rexene Agent, MD      . lidocaine-prilocaine (EMLA) cream 1 application  1 application Topical  PRN Rexene Agent, MD      . midazolam (VERSED) 1 mg/mL in sodium chloride 0.9 % 50 mL infusion  0-10 mg/hr Intravenous Continuous Rush Farmer, MD 3 mL/hr at 08/20/13 2148 3 mg/hr at 08/20/13 2148  . midazolam (VERSED) injection 4 mg  4 mg Intravenous Once Rush Farmer, MD      . ondansetron [facility]) tablet 4 mg  4 mg Oral Q6H PRN [name], MD       Or  . ondansetron [facility]) injection 4 mg  4 mg Intravenous Q6H PRN [name], MD      . pantoprazole (PROTONIX) injection 40 mg  40 mg Intravenous Q24H Donita Brooks, NP   40 mg at 08/20/13 2149  . pentafluoroprop-tetrafluoroeth (GEBAUERS) aerosol 1 application  1 application Topical PRN Rexene Agent, MD      . phenylephrine (NEO-SYNEPHRINE) 20 mg in dextrose 5 % 250 mL infusion  30-200 mcg/min Intravenous Titrated [name], MD      . sodium chloride 0.9 % injection 3 mL  3 mL Intravenous Q12H [name], MD

## 2013-08-22 DIAGNOSIS — D631 Anemia in chronic kidney disease: Secondary | ICD-10-CM | POA: Diagnosis not present

## 2013-08-22 DIAGNOSIS — I120 Hypertensive chronic kidney disease with stage 5 chronic kidney disease or end stage renal disease: Secondary | ICD-10-CM | POA: Diagnosis not present

## 2013-08-22 DIAGNOSIS — J9600 Acute respiratory failure, unspecified whether with hypoxia or hypercapnia: Secondary | ICD-10-CM | POA: Diagnosis not present

## 2013-08-22 DIAGNOSIS — N186 End stage renal disease: Secondary | ICD-10-CM | POA: Diagnosis not present

## 2013-08-22 LAB — GLUCOSE, CAPILLARY
Glucose-Capillary: 133 mg/dL — ABNORMAL HIGH (ref 70–99)
Glucose-Capillary: 135 mg/dL — ABNORMAL HIGH (ref 70–99)
Glucose-Capillary: 137 mg/dL — ABNORMAL HIGH (ref 70–99)
Glucose-Capillary: 150 mg/dL — ABNORMAL HIGH (ref 70–99)

## 2013-08-22 LAB — CBC WITH DIFFERENTIAL/PLATELET
Basophils Absolute: 0.1 10*3/uL (ref 0.0–0.1)
Eosinophils Absolute: 0.3 10*3/uL (ref 0.0–0.7)
Lymphocytes Relative: 11 % — ABNORMAL LOW (ref 12–46)
Lymphs Abs: 1.6 10*3/uL (ref 0.7–4.0)
MCH: 31.8 pg (ref 26.0–34.0)
MCV: 96.4 fL (ref 78.0–100.0)
Neutro Abs: 11.4 10*3/uL — ABNORMAL HIGH (ref 1.7–7.7)
Neutrophils Relative %: 75 % (ref 43–77)
Platelets: 231 10*3/uL (ref 150–400)
RBC: 3.59 MIL/uL — ABNORMAL LOW (ref 4.22–5.81)
WBC: 15.2 10*3/uL — ABNORMAL HIGH (ref 4.0–10.5)

## 2013-08-22 LAB — VANCOMYCIN, RANDOM: Vancomycin Rm: 27 ug/mL

## 2013-08-22 MED ORDER — DEXTROSE 5 % IV SOLN
1.0000 g | INTRAVENOUS | Status: DC
  Administered 2013-08-23 – 2013-08-27 (×5): 1 g via INTRAVENOUS
  Filled 2013-08-22 (×7): qty 1

## 2013-08-22 MED ORDER — ACETAMINOPHEN 650 MG RE SUPP
650.0000 mg | RECTAL | Status: DC | PRN
  Administered 2013-08-22 (×2): 650 mg via RECTAL
  Filled 2013-08-22 (×2): qty 1

## 2013-08-22 MED ORDER — ACETAMINOPHEN 325 MG PO TABS: 650.0000 mg | ORAL_TABLET | Freq: Four times a day (QID) | ORAL | Status: DC | PRN

## 2013-08-22 NOTE — Evaluation (Signed)
Passy-Mu[facility] Speaking Valve - Evaluation Patient Details  Name: Seth [name] MRN: [id number] Date of Birth: 09-10-1966  Today's Date: 08/22/2013 Time: 1510-1606 SLP Time Calculation (min): 35 min  Past Medical History:  Past Medical History  Diagnosis Date  . Diabetes mellitus   . Hypertension   . Chronic kidney disease   . 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 Av fistula placement  05/03/2012    Procedure: ARTERIOVENOUS (AV) FISTULA CREATION;  Surgeon: Mal [name], MD;  Location: [place];  Service: Vascular;  Laterality: Right;  . Tracheostomy tube placement N/A 08/20/2013    Procedure: TRACHEOSTOMY Revision;  Surgeon: Melida Quitter, MD;  Location: [place];  Service: ENT;  Laterality: N/A;   HPI:  Seth [name] is a 47 y.o. male was brought to the ER after patient was found to have increasing shortness of breath. Patient has been having dialysis regularly and states he has not missed his dialysis. He has been having some nonproductive cough for last 2-3 days with some fever and chills. He also was recently placed on CPAP for his obstructive sleep apnea which he has been regularly using for last 2 weeks. He has noticed some voice changes with his cough. In the ER patient was found to be lethargic and ABG showed hypercapnia and patient was placed on BiPAP. After a brief time patient's mental status improved largely. Critical care was consulted and at this time patient has been diabetic for further management. Patient denies any chest pain nausea vomiting abdominal pain. In the ER patient was found to be febrile and was placed on empiric antibiotics after blood cultures were taken. Chest x-ray shows features consistent with chest congestion.   Assessment / Plan / Recommendation Clinical Impression  Pt. had immediate phonation after coughing/clearing secretions.   Has good, strong cough and deep suction was not required.  Pt. required min. cues to take deep breath prior to speaking, and to increase volume.  Pt. stated, "I'm so blessed.  I"m lucky to be alive.  I was so helpless when I couldn't talk."  Pt. called his mother and grandmother on the phone, and would not allow SLP to remove PMSV at end of this session!  Educated pt. to need to have RN remove before he sleeps, and if at any point he feels increased WOB.  Will schedule FEES  for 11/14 am, and will complete education re: placement, removal, and cleaning of PMSV 11/14 as well.    SLP Assessment  Patient needs continued Speech Lanaguage Pathology Services    Follow Up Recommendations  24 hour supervision/assistance    Frequency and Duration min 2x/week  2 weeks   Pertinent Vitals/Pain SpO2  Increased from 98-100% and remained at 100% throughout this exam.    SLP Goals Potential to Achieve Goals: Good   PMSV Trial  PMSV was placed for: 40 minutes Able to redirect subglottic air through upper airway: Yes Able to Attain Phonation: Yes Voice Quality: Low vocal intensity Able to Expectorate Secretions: Yes Level of Secretion Expectoration with PMSV: Oral Breath Support for Phonation: Mildly decreased Intelligibility: Intelligibility reduced Phrase: 75-100% accurate Sentence: 75-100% accurate Conversation: 75-100% accurate Respirations During Trial: 22 SpO2 During Trial: 100 % Pulse During Trial: 97   Tracheostomy Tube       Vent Dependency  FiO2 (%): 35 %  Cuff Deflation Trial Tolerated Cuff Deflation: Yes Length of Time for Cuff Deflation Trial:  (Mostly deflated when slp entered room (sm. amt. removed)) Behavior: Alert;Cooperative;Expresses self well;Good eye contact;Oriented X3;Responsive to questions;Smiling;Tearful   Quinn Axe T 08/22/2013, 4:07 PM

## 2013-08-22 NOTE — Progress Notes (Signed)
Admit: 08/19/2013 LOS: 3  54M ESRD, morbid obesity, admitted with hypercapneic RF, volume overload, required emergent surgical airway (11/11).    Subjective:  Out of MICU to stepdown Fever to 102.3 overnight Pt on trach collar this AM Interactive, communicates by writing HD yesterday with 4.7L UF.  Remains at least 3kg above outpt EDW WBC up slightly  11/12 0701 - 11/13 0700 In: 158.1 [I.V.:48.1; NG/GT:60; IV Piggyback:50] Out: [id number] [Urine:100; Emesis/NG output:750]  Filed Weights   08/21/13 1245 08/21/13 1815 08/22/13 0500  Weight: 156.9 kg (345 lb 14.4 oz) 158.3 kg (348 lb 15.8 oz) 151.7 kg (334 lb 7 oz)    Current meds: reviewed Current Labs: reviewed  Outpt HD Orders  Unit: [place] MWF Time: 5h 92min  Dialyzer: F200  EDW: 148.5kg  K/Ca: 2k/2.25Ca  Access: AVF RFA  BFR: 550  UF Proflie: none  VDRA: Hectorol 10 qTx  EPO: 3000 qTx  IV Fe: Venofer 50 qWk   Physical Exam:  Blood pressure 107/57, pulse 101, temperature 99.4 F (37.4 C), temperature source Oral, resp. rate 31, height 5\' 8"  (1.727 m), weight 151.7 kg (334 lb 7 oz), SpO2 99.00%. GEN: Morbidly obese, awake and interactive ENT: Trach in place, on trach collar CV: RRR, nl s1s2 LUNGS: coarse bs b/l ABD: obese, soft NEURO: interactive, mae, nf EXT: RFA AVF +b/t EYES: EOMI, muddy sclera  Assessment/Plan 1. ESRD on HD MWF: Given soft BP and fever overnight will not have extra HD today.  Challenge EDW tomorrow.   2. Anemia of CKD: Cont ESA as aranesp 40 qwk, next 11/19 3. HTN/Vol: as above.  Outpt EDW will be challenged as able in house 4. Hypercapneif RF s/p tracheostomy and URI: per CCM and ENT.  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  Not eating currently.      [name] MD 08/22/2013, 8:03 AM   Recent Labs Lab 08/20/13 0355 08/20/13 1131 08/20/13 1506 08/21/13 0500  NA 130* 132*  --  137  K 6.0* 7.3* 3.3* 4.3  CL 89* 89*  --  93*  CO2 24 28  --  25  GLUCOSE 313* 296*  --  157*  BUN  63* 69*  --  60*  CREATININE 12.27* 13.21*  --  12.13*  CALCIUM 8.4 8.6  --  8.1*  PHOS 11.3* 12.7*  --  6.1*    Recent Labs Lab 08/20/13 0355 08/20/13 1131 08/21/13 0500 08/22/13 0500  WBC 13.3* 12.2* 13.4* 15.2*  NEUTROABS 9.8*  --   --  11.4*  HGB 11.3* 10.5* 9.5* 11.4*  HCT 34.7* 33.1* 29.1* 34.6*  MCV 97.5 96.8 94.2 96.4  PLT 230 217 200 231    Current Facility-Administered Medications  Medication Dose Route Frequency Provider Last Rate Last Dose  . 0.9 %  sodium chloride infusion  100 mL Intravenous PRN Rexene Agent, MD      . 0.9 %  sodium chloride infusion  100 mL Intravenous PRN Rexene Agent, MD      . acetaminophen (TYLENOL) solution 650 mg  650 mg Oral Q6H PRN Raylene Miyamoto, MD      . acetaminophen (TYLENOL) suppository 650 mg  650 mg Rectal Q4H PRN Rigoberto Noel, MD   650 mg at 08/22/13 0507   Or  . acetaminophen (TYLENOL) tablet 650 mg  650 mg Oral Q6H PRN Rigoberto Noel, MD      . antiseptic oral rinse (BIOTENE) solution 15 mL  15 mL Mouth Rinse  QID Juanito Doom, MD   15 mL at 08/22/13 0400  . ceFEPIme (MAXIPIME) 2 g in dextrose 5 % 50 mL IVPB  2 g Intravenous Q M,W,F-2000 Rogue Bussing, RPH   2 g at 08/21/13 2052  . chlorhexidine (PERIDEX) 0.12 % solution 15 mL  15 mL Mouth Rinse BID Juanito Doom, MD   15 mL at 08/21/13 2017  . darbepoetin (ARANESP) injection 40 mcg  40 mcg Intravenous Q Wed-HD Rexene Agent, MD   40 mcg at 08/21/13 1631  . fentaNYL (SUBLIMAZE) 10 mcg/mL in sodium chloride 0.9 % 250 mL infusion  0-400 mcg/hr Intravenous Continuous Rush Farmer, MD   100 mcg/hr at 08/21/13 0800  . fentaNYL (SUBLIMAZE) bolus via infusion 50-100 mcg  50-100 mcg Intravenous Q1H PRN Rush Farmer, MD   50 mcg at 08/21/13 0905  . heparin injection 1,000 Units  1,000 Units Dialysis PRN Rexene Agent, MD      . heparin injection 15,500 Units  100 Units/kg Dialysis PRN Rexene Agent, MD   3,100 Units at 08/21/13 1351  . heparin injection 5,000  Units  5,000 Units Subcutaneous Q8H Raylene Miyamoto, MD   5,000 Units at 08/22/13 0507  . insulin aspart (novoLOG) injection 0-9 Units  0-9 Units Subcutaneous TID WC [name], MD   5 Units at 08/21/13 1224  . lidocaine (PF) (XYLOCAINE) 1 % injection 5 mL  5 mL Intradermal PRN Rexene Agent, MD      . lidocaine-prilocaine (EMLA) cream 1 application  1 application Topical PRN Rexene Agent, MD      . midazolam (VERSED) 1 mg/mL in sodium chloride 0.9 % 50 mL infusion  0-10 mg/hr Intravenous Continuous Rush Farmer, MD   3 mg/hr at 08/20/13 2148  . midazolam (VERSED) injection 4 mg  4 mg Intravenous Once Rush Farmer, MD      . ondansetron [facility]) tablet 4 mg  4 mg Oral Q6H PRN [name], MD       Or  . ondansetron [facility]) injection 4 mg  4 mg Intravenous Q6H PRN [name], MD      . pantoprazole (PROTONIX) injection 40 mg  40 mg Intravenous Q24H Donita Brooks, NP   40 mg at 08/21/13 2206  . pentafluoroprop-tetrafluoroeth (GEBAUERS) aerosol 1 application  1 application Topical PRN Rexene Agent, MD      . phenylephrine (NEO-SYNEPHRINE) 20 mg in dextrose 5 % 250 mL infusion  30-200 mcg/min Intravenous Titrated [name], MD      . sodium chloride 0.9 % injection 3 mL  3 mL Intravenous Q12H [name], MD   3 mL at 08/21/13 2206

## 2013-08-22 NOTE — Progress Notes (Signed)
PULMONARY  / CRITICAL CARE MEDICINE  Name: Seth [name] MRN: [id number] DOB: 08/22/1966    ADMISSION DATE:  08/19/2013 CONSULTATION DATE:  08/19/2013  REFERRING MD :  EDP PRIMARY SERVICE: PCCM  CHIEF COMPLAINT:  Cough, congestion  BRIEF PATIENT DESCRIPTION: 47 yo with OSA, ESRD presented to the [facility] ED on 11/10 with fever, cough, and chest congestion.  In the ED he developed hypercapnic respiratory failure requiring BIPAP briefly.  PCCM asked to evaluate for ICU.  SIGNIFICANT EVENTS / STUDIES:  11/11   Admited with SOB, cough, fever. Decompensated on SDU requiring emergent surgical airway. 11/11   OR >>> Revision of tracheostomy 11/12   Transferred to SDU  LINES / TUBES: R Fem TLC 11/11>>>  R Rad A line 11/11>>> 11/12 OETT in Trach 11/11>>>11/11 Trach 11/11 >>>  CULTURES: 11/11  Blood >>>  11/11  Urine >>>  ANTIBIOTICS: Cefepime 11/11 >>>  Vanco 11/11 >>> Cefepime 11/11 >>>  SUBJECTIVE: No active issues overnight.  VITAL SIGNS: Temp:  [98.7 F (37.1 C)-102.3 F (39.1 C)] 98.9 F (37.2 C) (11/13 0837) Pulse Rate:  [78-103] 95 (11/13 1204) Resp:  [16-31] 27 (11/13 1204) BP: (107-155)/(57-110) 149/73 mmHg (11/13 1204) SpO2:  [95 %-100 %] 95 % (11/13 1204) Arterial Line BP: (88)/(85) 88/85 mmHg (11/12 1400) FiO2 (%):  [35 %-40 %] 35 % (11/13 1204) Weight:  [151.7 kg (334 lb 7 oz)-158.3 kg (348 lb 15.8 oz)] 151.7 kg (334 lb 7 oz) (11/13 0500)  HEMODYNAMICS:   VENTILATOR SETTINGS: Vent Mode:  [-] PRVC FiO2 (%):  [35 %-40 %] 35 % Set Rate:  [35 bmp] 35 bmp Vt Set:  [450 mL] 450 mL PEEP:  [5 cmH20] 5 cmH20 Pressure Support:  [16 cmH20] 16 cmH20 Plateau Pressure:  [12 cmH20-32 cmH20] 32 cmH20  INTAKE / OUTPUT: Intake/Output     11/12 0701 - 11/13 0700 11/13 0701 - 11/14 0700   I.V. (mL/kg) 48.1 (0.3) 30 (0.2)   NG/GT 60    IV Piggyback 50    Total Intake(mL/kg) 158.1 (1) 30 (0.2)   Urine (mL/kg/hr) 100 (0)    Emesis/NG output 750 (0.2)    Other 4674 (1.3)     Blood     Total Output 5524     Net -5365.9 +30         PHYSICAL EXAMINATION:  Gen: No distress, comfortable HEENT: NCAT, tracheostomy site intact PULM: Bilateral diminished air entry CV: RRR, distant heart sounds AB: Obese, non-tender, bowel sounds present Ext: Warm, trace edema Derm: Skin intact  Neuro: Non focal  LABS:  CBC  Recent Labs Lab 08/20/13 1131 08/21/13 0500 08/22/13 0500  WBC 12.2* 13.4* 15.2*  HGB 10.5* 9.5* 11.4*  HCT 33.1* 29.1* 34.6*  PLT 217 200 231   Coag's  Recent Labs Lab 08/20/13 1131  INR 1.09   BMET  Recent Labs Lab 08/20/13 0355 08/20/13 1131 08/20/13 1506 08/21/13 0500  NA 130* 132*  --  137  K 6.0* 7.3* 3.3* 4.3  CL 89* 89*  --  93*  CO2 24 28  --  25  BUN 63* 69*  --  60*  CREATININE 12.27* 13.21*  --  12.13*  GLUCOSE 313* 296*  --  157*   Electrolytes  Recent Labs Lab 08/20/13 0355 08/20/13 1131 08/21/13 0500  CALCIUM 8.4 8.6 8.1*  MG  --  2.4 1.9  PHOS 11.3* 12.7* 6.1*   Sepsis Markers  Recent Labs Lab 08/19/13 2245 08/19/13 2312 08/20/13 1130  LATICACIDVEN 0.7  1.00 1.0   ABG  Recent Labs Lab 08/20/13 1231 08/20/13 2224 08/21/13 1421  PHART 7.251* 7.387 7.349*  PCO2ART 65.6* 47.3* 55.9*  PO2ART 332.0* 86.0 97.0   Liver Enzymes  Recent Labs Lab 08/19/13 1901 08/20/13 0355 08/20/13 1131  AST 17 18 20   ALT 19 18 18   ALKPHOS 61 60 54  BILITOT 0.3 0.3 0.4  ALBUMIN 4.1 3.8 3.6   Cardiac Enzymes  Recent Labs Lab 08/19/13 2012 08/20/13 0016 08/20/13 1216  TROPONINI  --  <0.30 <0.30  PROBNP 5311.0*  --   --    Glucose  Recent Labs Lab 08/21/13 1215 08/21/13 1547 08/21/13 2119 08/22/13 0029 08/22/13 0449 08/22/13 0834  GLUCAP 168* 103* 125* 133* 150* 227*   CXR:    ASSESSMENT / PLAN:  PULMONARY A: Acute on conic hpercapnic respiratory failure. Difficult Airway, sp emergent bedside trach, revised by ENT.Obesity hypoventilation syndrome. Acute tracheobronchitis.   Pulmonary edema in setting of ESRD / fluid overload - resolved. P:   Goal SpO2>92 Supplemental oxygen via trach collar Vent PRN PMV  CARDIOVASCULAR A: Suspected acute on chronic diastolic heart failure. P:  Hold norvasc TTE D/c Neo-Synephrine   RENAL A:  ESRD on HD. P:   Renal following  INFECTIOUS A:  Acute racheobronchitis P:   Cx / abx as above  GI A: Morbid Obesity P: NPO SLP eval Protonix for GI Px D/c NGT  HEMATOLOGIC A:  Anemia, chronic, stable. P:  Trend CBC Heparin for DVT Px Aranesp  ENDOCRINE A: Diabetes mellitus. P: SSI  NEUROLOGIC A:  Acute encephalopathy - resolving.  P:   D/c Fentanyl / Versed Fentanyl PRN  I have personally obtained history, examined patient, evaluated and interpreted laboratory and imaging results, reviewed medical records, formulated assessment / plan and placed orders.  Doree Fudge, MD Pulmonary and [place] Pager: [phone]  08/22/2013, 12:51 PM

## 2013-08-22 NOTE — Progress Notes (Signed)
ANTIBIOTIC CONSULT NOTE - FOLLOW UP  Pharmacy Consult for Vancomycin Indication: rule out sepsis  No Known Allergies Patient Measurements: Height: 5\' 8"  (172.7 cm) Weight: 334 lb 7 oz (151.7 kg) IBW/kg (Calculated) : 68.4 Vital Signs: Temp: 98.9 F (37.2 C) (11/13 0837) Temp src: Oral (11/13 0837) BP: 129/85 mmHg (11/13 0837) Pulse Rate: 97 (11/13 0837) Intake/Output from previous day: 11/12 0701 - 11/13 0700 In: 158.1 [I.V.:48.1; NG/GT:60; IV Piggyback:50] Out: [id number] [Urine:100; Emesis/NG output:750] Labs:  Recent Labs  08/20/13 0355 08/20/13 1131 08/21/13 0500 08/22/13 0500  WBC 13.3* 12.2* 13.4* 15.2*  HGB 11.3* 10.5* 9.5* 11.4*  PLT 230 217 200 231  CREATININE 12.27* 13.21* 12.13*  --    Estimated Creatinine Clearance: 10.6 ml/min (by C-G formula based on Cr of 12.13). Microbiology: Recent Results (from the past 720 hour(s))  CULTURE, BLOOD (ROUTINE X 2)     Status: None   Collection Time    08/19/13 10:40 PM      Result Value Range Status   Specimen Description BLOOD ARM RIGHT   Final   Special Requests BOTTLES DRAWN AEROBIC ONLY 20CC   Final   Culture  Setup Time     Final   Value: 08/20/2013 02:36     Performed at [facility]   Culture     Final   Value:        BLOOD CULTURE RECEIVED NO GROWTH TO DATE CULTURE WILL BE HELD FOR 5 DAYS BEFORE ISSUING A FINAL NEGATIVE REPORT     Performed at [facility]   Report Status PENDING   Incomplete  CULTURE, BLOOD (ROUTINE X 2)     Status: None   Collection Time    08/19/13 10:47 PM      Result Value Range Status   Specimen Description BLOOD WRIST LEFT   Final   Special Requests BOTTLES DRAWN AEROBIC AND ANAEROBIC 10CC EA   Final   Culture  Setup Time     Final   Value: 08/20/2013 02:36     Performed at [facility]   Culture     Final   Value:        BLOOD CULTURE RECEIVED NO GROWTH TO DATE CULTURE WILL BE HELD FOR 5 DAYS BEFORE ISSUING A FINAL NEGATIVE REPORT     Performed at [facility]   Report Status PENDING   Incomplete  URINE CULTURE     Status: None   Collection Time    08/19/13 10:54 PM      Result Value Range Status   Specimen Description URINE, RANDOM   Final   Special Requests CX ADDED AT 2329 ON [id number]   Final   Culture  Setup Time     Final   Value: 08/19/2013 23:45     Performed at [facility]   Culture     Final   Value: NO GROWTH     Performed at [facility]   Report Status 08/21/2013 FINAL   Final  MRSA PCR SCREENING     Status: None   Collection Time    08/20/13  5:55 AM      Result Value Range Status   MRSA by PCR NEGATIVE  NEGATIVE Final   Comment:            The GeneXpert MRSA Assay (FDA     approved for NASAL specimens     only), is one component of a     comprehensive MRSA  colonization     surveillance program. It is not     intended to diagnose MRSA     infection nor to guide or     monitor treatment for     MRSA infections.    Anti-infectives   Start     Dose/Rate Route Frequency Ordered Stop   08/23/13 1000  ceFEPIme (MAXIPIME) 1 g in dextrose 5 % 50 mL IVPB     1 g 100 mL/hr over 30 Minutes Intravenous Every 24 hours 08/22/13 0924     08/21/13 1200  vancomycin (VANCOCIN) 1,250 mg in sodium chloride 0.9 % 250 mL IVPB  Status:  Discontinued     1,250 mg 250 mL/hr over 60 Minutes Intravenous Every M-W-F (Hemodialysis) 08/19/13 2231 08/21/13 0741   08/20/13 1415  ceFEPIme (MAXIPIME) 2 g in dextrose 5 % 50 mL IVPB     2 g 100 mL/hr over 30 Minutes Intravenous  Once 08/20/13 1406 08/20/13 1627   08/20/13 1415  vancomycin (VANCOCIN) IVPB 1000 mg/200 mL premix     1,000 mg 200 mL/hr over 60 Minutes Intravenous  Once 08/20/13 1406 08/20/13 1740   08/20/13 0300  ceFEPIme (MAXIPIME) 2 g in dextrose 5 % 50 mL IVPB  Status:  Discontinued     2 g 100 mL/hr over 30 Minutes Intravenous Every M-W-F (2000) 08/20/13 0246 08/22/13 0924   08/19/13 2230  vancomycin (VANCOCIN) 2,500 mg in sodium chloride 0.9 % 500 mL  IVPB     2,500 mg 250 mL/hr over 120 Minutes Intravenous  Once 08/19/13 2222 08/20/13 0146   08/19/13 2215  piperacillin-tazobactam (ZOSYN) IVPB 3.375 g     3.375 g 100 mL/hr over 30 Minutes Intravenous  Once 08/19/13 2205 08/20/13 0149     Assessment: 82 YOM s/p emergent difficult intubation who went to OR yesterday for trach. He is also with ESRD s/p HD yesterday. He is on empiric vanc + cefepime. Random vanc is still 27. No plan for HD today. Will change cefepime around too until HD schedule is consistent.   Cefepime 11/11 >> Vanc 11/11 >>   11/11 Blood >> pending 11/11 Urine >> NEG   Goal of Therapy:  Pre-HD Vancomycin level 15-25 mcg/ml  Plan:   Cont to hold vanc Change cefepime to 1g q24

## 2013-08-22 NOTE — Progress Notes (Signed)
SPEECH PATHOLOGY  Order received for swallow evaluation with MBS if needed.  Due to pt's body habitus, a MBS can not be completed.  MD, please order FEES if you agree.  Quinn Axe T

## 2013-08-23 DIAGNOSIS — N186 End stage renal disease: Secondary | ICD-10-CM | POA: Diagnosis not present

## 2013-08-23 DIAGNOSIS — I120 Hypertensive chronic kidney disease with stage 5 chronic kidney disease or end stage renal disease: Secondary | ICD-10-CM | POA: Diagnosis not present

## 2013-08-23 DIAGNOSIS — J9600 Acute respiratory failure, unspecified whether with hypoxia or hypercapnia: Secondary | ICD-10-CM | POA: Diagnosis not present

## 2013-08-23 DIAGNOSIS — D631 Anemia in chronic kidney disease: Secondary | ICD-10-CM | POA: Diagnosis not present

## 2013-08-23 LAB — GLUCOSE, CAPILLARY: Glucose-Capillary: 155 mg/dL — ABNORMAL HIGH (ref 70–99)

## 2013-08-23 MED ORDER — HEPARIN SODIUM (PORCINE) 1000 UNIT/ML DIALYSIS
20.0000 [IU]/kg | INTRAMUSCULAR | Status: DC | PRN
  Filled 2013-08-23: qty 4

## 2013-08-23 MED ORDER — VANCOMYCIN HCL IN DEXTROSE 1-5 GM/200ML-% IV SOLN
1000.0000 mg | Freq: Once | INTRAVENOUS | Status: DC
  Filled 2013-08-23: qty 200

## 2013-08-23 NOTE — Progress Notes (Signed)
SLP Cancellation Note  Patient Details Name: TEXAS HUTTNER MRN: [id number] DOB: 10/20/1965   Cancelled treatment:       Reason Eval/Treat Not Completed: Patient at procedure or test/unavailable (in HD. Will f/u 11/15. )  Gabriel Rainwater [place], [place] [phone]   Analyn Matusek Meryl 08/23/2013, 2:34 PM

## 2013-08-23 NOTE — Progress Notes (Signed)
PULMONARY  / CRITICAL CARE MEDICINE  Name: Seth [name] MRN: [id number] DOB: 12-Jul-1966    ADMISSION DATE:  08/19/2013 CONSULTATION DATE:  08/19/2013  REFERRING MD :  EDP PRIMARY SERVICE: PCCM  CHIEF COMPLAINT:  Cough, congestion  BRIEF PATIENT DESCRIPTION: 47 yo with OSA, ESRD presented to the [facility] ED on 11/10 with fever, cough, and chest congestion.  In the ED he developed hypercapnic respiratory failure requiring BIPAP briefly.  PCCM asked to evaluate for ICU.  SIGNIFICANT EVENTS / STUDIES:  11/11   Admited with SOB, cough, fever. Decompensated on SDU requiring emergent surgical airway. 11/11   OR >>> Revision of tracheostomy 11/12   Transferred to SDU  LINES / TUBES: R Fem TLC 11/11>>>  R Rad A line 11/11>>> 11/12 OETT in Trach 11/11>>>11/11 Trach 11/11 >>>  CULTURES: 11/11  Blood >>>  11/11  Urine >>>neg  ANTIBIOTICS: Cefepime 11/11 >>>  Vanco 11/11 >>>11/14 Cefepime 11/11 >>  SUBJECTIVE: No active issues overnight. Feels at baseline   VITAL SIGNS: Temp:  [98.3 F (36.8 C)-99.2 F (37.3 C)] 98.6 F (37 C) (11/14 0432) Pulse Rate:  [90-100] 97 (11/14 1128) Resp:  [15-27] 20 (11/14 1128) BP: (131-156)/(65-101) 139/101 mmHg (11/14 0800) SpO2:  [10 %-100 %] 100 % (11/14 1128) FiO2 (%):  [28 %-35 %] 28 % (11/14 1128) Weight:  [161.4 kg (355 lb 13.2 oz)] 161.4 kg (355 lb 13.2 oz) (11/14 0500)  HEMODYNAMICS:   VENTILATOR SETTINGS: Vent Mode:  [-]  FiO2 (%):  [28 %-35 %] 28 %  INTAKE / OUTPUT: Intake/Output     11/13 0701 - 11/14 0700 11/14 0701 - 11/15 0700   I.V. (mL/kg) 30 (0.2) 30 (0.2)   NG/GT 0    IV Piggyback     Total Intake(mL/kg) 30 (0.2) 30 (0.2)   Urine (mL/kg/hr)     Emesis/NG output     Other     Total Output       Net +30 +30         PHYSICAL EXAMINATION:  Gen: No distress, comfortable HEENT: NCAT, tracheostomy site intact, no bleeding  PULM: Bilateral diminished air entry CV: RRR, distant heart sounds AB: Obese, non-tender,  bowel sounds present Ext: Warm, trace edema Derm: Skin intact  Neuro: Non focal  LABS:  CBC  Recent Labs Lab 08/20/13 1131 08/21/13 0500 08/22/13 0500  WBC 12.2* 13.4* 15.2*  HGB 10.5* 9.5* 11.4*  HCT 33.1* 29.1* 34.6*  PLT 217 200 231   Coag's  Recent Labs Lab 08/20/13 1131  INR 1.09   BMET  Recent Labs Lab 08/20/13 0355 08/20/13 1131 08/20/13 1506 08/21/13 0500  NA 130* 132*  --  137  K 6.0* 7.3* 3.3* 4.3  CL 89* 89*  --  93*  CO2 24 28  --  25  BUN 63* 69*  --  60*  CREATININE 12.27* 13.21*  --  12.13*  GLUCOSE 313* 296*  --  157*   Electrolytes  Recent Labs Lab 08/20/13 0355 08/20/13 1131 08/21/13 0500  CALCIUM 8.4 8.6 8.1*  MG  --  2.4 1.9  PHOS 11.3* 12.7* 6.1*   Sepsis Markers  Recent Labs Lab 08/19/13 2245 08/19/13 2312 08/20/13 1130  LATICACIDVEN 0.7 1.00 1.0   ABG  Recent Labs Lab 08/20/13 1231 08/20/13 2224 08/21/13 1421  PHART 7.251* 7.387 7.349*  PCO2ART 65.6* 47.3* 55.9*  PO2ART 332.0* 86.0 97.0   Liver Enzymes  Recent Labs Lab 08/19/13 1901 08/20/13 0355 08/20/13 1131  AST 17 18  20  ALT 19 18 18   ALKPHOS 61 60 54  BILITOT 0.3 0.3 0.4  ALBUMIN 4.1 3.8 3.6   Cardiac Enzymes  Recent Labs Lab 08/19/13 2012 08/20/13 0016 08/20/13 1216  TROPONINI  --  <0.30 <0.30  PROBNP 5311.0*  --   --    Glucose  Recent Labs Lab 08/22/13 0449 08/22/13 0834 08/22/13 1319 08/22/13 1619 08/22/13 2036 08/23/13 0813  GLUCAP 150* 227* 139* 135* 137* 155*   CXR:    ASSESSMENT / PLAN:  PULMONARY A: Acute on conic hpercapnic respiratory failure. Difficult Airway, sp emergent bedside trach, revised by ENT.Obesity hypoventilation syndrome. Acute tracheobronchitis.  Pulmonary edema in setting of ESRD / fluid overload - resolved. P:   Not a candidate for de-cannulation  Goal SpO2>92 Supplemental oxygen via trach collar Vent PRN PMV Trach care teaching   CARDIOVASCULAR A: Suspected acute on chronic diastolic heart  failure. P:  Hold norvasc TTE   RENAL A:  ESRD on HD. P:   Renal following  INFECTIOUS A:  Acute tracheobronchitis P:   Cx / abx as above Will d/c vanc   GI A: Morbid obesity. Suspected dysphagia. P: FEES  Advance diet as tolerated   HEMATOLOGIC A:  Anemia, chronic, stable. P:  Trend CBC Heparin for DVT Px Aranesp  ENDOCRINE A: Diabetes mellitus. P: SSI  NEUROLOGIC A:  Acute encephalopathy - resolving.  P:   Supportive care   [place]  08/23/2013, 12:17 PM  I have personally obtained history, examined patient, evaluated and interpreted laboratory and imaging results, reviewed medical records, formulated assessment / plan and placed orders.  Doree Fudge, MD Pulmonary and [place] Pager: [phone]  08/23/2013, 2:35 PM

## 2013-08-23 NOTE — Progress Notes (Signed)
PT Cancellation Note  Patient Details Name: Seth [name] MRN: [id number] DOB: January 19, 1966   Cancelled Treatment:    Reason Eval/Treat Not Completed: Patient at procedure or test/unavailable (pt in HD and unable to be seen at this time)   Melford Aase 08/23/2013, 1:15 PM Elwyn Reach, [place]

## 2013-08-23 NOTE — Procedures (Signed)
I was present at this dialysis session. I have reviewed the session itself and made appropriate changes.   Pt doing well.  Using PM valve and talking.  No c/o.  AVF.    [name]  MD 08/23/2013, 3:26 PM

## 2013-08-23 NOTE — Op Note (Signed)
NAMEMarland Kitchen  Seth, [name] NO.:  [id number]  MEDICAL RECORD NO.:  [id number]  LOCATION:  2M15C                        FACILITY:  [facility]  PHYSICIAN:  Onnie Graham, MD     DATE OF BIRTH:  19-Apr-1966  DATE OF PROCEDURE:  08/19/2013 DATE OF DISCHARGE:                              OPERATIVE REPORT   PREOPERATIVE DIAGNOSIS: 1. Respiratory failure. 2. Status pose emergency surgical airway.  POSTOPERATIVE DIAGNOSIS: 1. Respiratory failure. 2. Status pose emergency surgical airway.  PROCEDURE:  Revision tracheostomy.  SURGEON:  Onnie Graham, MD  ANESTHESIA:  General endotracheal anesthesia.  COMPLICATIONS:  None.  INDICATION:  The patient is a 47 year old male with multiple medical problems including renal failure, and morbid obesity, who was admitted to the hospital last night with altered mental status and breathing difficulty.  He was treated through the night with various methods to try to help him breathe including BiPAP but worsened through the night. This morning, he was brought to the intensive care unit, and attempt was made to intubate the patient but was unsuccessful.  As he was worsening, an emergency surgical airway was performed at the bedside, and an endotracheal tube placed into the airway.  He has been relatively stable since then, sedated on a ventilator and presents to the operating room for revision tracheostomy for stabilizing airway.  Findings:  Orotracheal intubation was performed by the anesthesia team using the GlideScope with a fair amount of difficulty.  The patient has a small mouth, large tongue, and a great deal of redundant tissue throughout his pharynx.  Ultimately, the tube was able to be placed through the glottis and the tube in his neck was able to be removed safely.  Exploring the wound ultimately revealed that the emergency airway was placed through the trachea between rings 3 and 4 in a very precise fashion.  The same  wound of the trachea was used for placement of his tracheostomy tube.  DESCRIPTION OF PROCEDURE:  The patient was identified in the intensive care unit and brought to the operating room and placed in operative suite in supine position.  A shoulder roll was placed, and the patient was positioned.  The anesthesia team then performed laryngoscopy using a glide scope and after a few different attempts was able to visualize the larynx enough to feel comfortable placing endotracheal tube through the larynx.  A small endotracheal tube was placed.  The balloon was inflated and the balloon on the endotracheal tube through his neck was deflated. The patient is able to be successfully ventilated through the orotracheal endotracheal tube.  Thus, the balloon on the orotracheal tube was again deflated and the tube through his neck was removed by removing the sutures and backing the tube out safely.  The cuff of the orotracheal tube was reinflated and the patient was successfully ventilated throughout the remainder of the case without difficulty.  The neck was prepped and draped in sterile fashion.  The neck wound was extended superiorly and inferiorly slightly with Bovie electrocautery. Soft tissues were developed, divided in that direction down to the strap muscles where the division through the strap muscles was also extended with the Bovie electrocautery.  This allowed visualization of the thyroid isthmus which was partially incised already but was completed but the incision was completed with Bovie electrocautery.  The trachea was then easily seen and the wound in the trachea seemed to be in a position seemingly between rings 3 and 4.  The wound was widened slightly in the trachea using Bovie electrocautery.  The upper rounding of the wound was then grabbed with a 2-0 silk suture, which was clamped superiorly.  The same was then done in the inferior ring.  Endotracheal tube balloon was then  deflated and the tube slowly backed out to just above the tracheal incision.  A proximal extended length #8 Shiley trach tube was then placed into the airway and the cuff inflated.  The inner cannula was placed and the patient was then attached to the anesthesia circuit and successfully ventilated.  The stay sutures that were placed in the trachea were then tied with 2 knots above and 1 knot below the tracheostomy site.  The trach flange was secured to the neck using 0 silk suture in 4 quadrants.  The patient was then cleaned off and a trach dressing and trachea tie were added.  The patient was returned to anesthesia for transport and was moved to intensive care unit in stable condition.     Onnie Graham, MD     DDB/MEDQ  D:  08/20/2013  T:  08/21/2013  Job:  [id number]

## 2013-08-23 NOTE — Progress Notes (Signed)
Pt seen by trach team. Pt awake and alert. No plans to discharge at this time. Will follow back up on 08/26/13. Appropriate trach equipment at bedside.

## 2013-08-23 NOTE — Progress Notes (Signed)
Pt has remained off of vent all night on 35% ATC. Was decreased to 28% ATC. HR 96, RR 22, Sats 96%. Pt has rested and appears to be in no distress. RT will continue to monitor

## 2013-08-24 DIAGNOSIS — D631 Anemia in chronic kidney disease: Secondary | ICD-10-CM | POA: Diagnosis not present

## 2013-08-24 DIAGNOSIS — I120 Hypertensive chronic kidney disease with stage 5 chronic kidney disease or end stage renal disease: Secondary | ICD-10-CM | POA: Diagnosis not present

## 2013-08-24 DIAGNOSIS — I10 Essential (primary) hypertension: Secondary | ICD-10-CM

## 2013-08-24 DIAGNOSIS — J9600 Acute respiratory failure, unspecified whether with hypoxia or hypercapnia: Secondary | ICD-10-CM | POA: Diagnosis not present

## 2013-08-24 DIAGNOSIS — E40 Kwashiorkor: Secondary | ICD-10-CM

## 2013-08-24 DIAGNOSIS — N186 End stage renal disease: Secondary | ICD-10-CM | POA: Diagnosis not present

## 2013-08-24 DIAGNOSIS — Z5189 Encounter for other specified aftercare: Secondary | ICD-10-CM

## 2013-08-24 LAB — GLUCOSE, CAPILLARY
Glucose-Capillary: 191 mg/dL — ABNORMAL HIGH (ref 70–99)
Glucose-Capillary: 269 mg/dL — ABNORMAL HIGH (ref 70–99)

## 2013-08-24 LAB — BASIC METABOLIC PANEL
BUN: 76 mg/dL — ABNORMAL HIGH (ref 6–23)
Calcium: 10.7 mg/dL — ABNORMAL HIGH (ref 8.4–10.5)
GFR calc Af Amer: 4 mL/min — ABNORMAL LOW (ref >90)
GFR calc non Af Amer: 4 mL/min — ABNORMAL LOW (ref >90)
Glucose, Bld: 189 mg/dL — ABNORMAL HIGH (ref 70–99)
Potassium: 5.3 mEq/L — ABNORMAL HIGH (ref 3.5–5.1)
Sodium: 135 mEq/L (ref 135–145)

## 2013-08-24 MED ORDER — ALTEPLASE 2 MG IJ SOLR
2.0000 mg | Freq: Once | INTRAMUSCULAR | Status: AC
  Administered 2013-08-24: 2 mg
  Filled 2013-08-24: qty 2

## 2013-08-24 NOTE — Progress Notes (Signed)
Speech Language Pathology Treatment: Dysphagia  Patient Details Name: Seth [name] MRN: [id number] DOB: [birth year] Today's Date: 08/24/2013 Time: 0950-1000 SLP Time Calculation (min): 10 min  Assessment / Plan / Recommendation Clinical Impression  Following bedside swallow evaluation, SLP observed pt. With regular texture and thin liquids.  He needed min verbal reminders to take smaller sips especially via straw and explained reasoning.  No cough, throat clear or wet vocal quality exhibited with numerous boluses.  Continue regular texture and thin liquids.  SLP will continue to treat for dysphagia and PMSV.   HPI HPI: [name] is a 47 y.o. male was brought to the ER after patient was found to have increasing shortness of breath. Patient has been having dialysis regularly and states he has not missed his dialysis. He has been having some nonproductive cough for last 2-3 days with some fever and chills. He also was recently placed on CPAP for his obstructive sleep apnea which he has been regularly using for last 2 weeks. He has noticed some voice changes with his cough. In the ER patient was found to be lethargic and ABG showed hypercapnia and patient was placed on BiPAP. After a brief time patient's mental status improved largely. Critical care was consulted and at this time patient has been diabetic for further management. Patient denies any chest pain nausea vomiting abdominal pain. In the ER patient was found to be febrile and was placed on empiric antibiotics after blood cultures were taken. Chest x-ray shows features consistent with chest congestion.   Pertinent Vitals WDL  SLP Plan  Continue with current plan of care    Recommendations Diet recommendations: Regular;Thin liquid Liquids provided via: Straw;Cup Medication Administration: Whole meds with liquid Supervision: Patient able to self feed;Intermittent supervision to cue for compensatory strategies Compensations: Slow  rate;Small sips/bites (Valve donned with all po's) Postural Changes and/or Swallow Maneuvers: Seated upright 90 degrees;Upright 30-60 min after meal      PMSV Supervision: Full       Oral Care Recommendations: Oral care BID Follow up Recommendations: 24 hour supervision/assistance Plan: Continue with current plan of care    GO     Orbie Pyo Teresa Lemmerman M.Ed [employer] [phone]  08/24/2013

## 2013-08-24 NOTE — Progress Notes (Signed)
PULMONARY  / CRITICAL CARE MEDICINE  Name: Seth [name] MRN: [id number] DOB: 07/07/1966    ADMISSION DATE:  08/19/2013 CONSULTATION DATE:  08/19/2013  REFERRING MD :  EDP PRIMARY SERVICE: PCCM  CHIEF COMPLAINT:  Cough, congestion  BRIEF PATIENT DESCRIPTION: 47 yo with OSA, ESRD presented to the [facility] ED on 11/10 with fever, cough, and chest congestion.  In the ED he developed hypercapnic respiratory failure requiring BIPAP briefly.  PCCM asked to evaluate for ICU.  SIGNIFICANT EVENTS / STUDIES:  11/11   Admited with SOB, cough, fever. Decompensated on SDU requiring emergent surgical airway. 11/11   OR >>> Revision of tracheostomy 11/12   Transferred to SDU  LINES / TUBES: R Fem TLC 11/11>>>  R Rad A line 11/11>>> 11/12 OETT in Trach 11/11>>>11/11 Trach 11/11 >>>  CULTURES: 11/11  Blood >>>  11/11  Urine >>>neg  ANTIBIOTICS: Cefepime 11/11 >>>  Vanco 11/11 >>>11/14 Cefepime 11/11 >>  SUBJECTIVE: No active issues overnight. Feels at baseline   VITAL SIGNS: Temp:  [98.1 F (36.7 C)-99.1 F (37.3 C)] 98.5 F (36.9 C) (11/15 0800) Pulse Rate:  [71-106] 87 (11/15 0810) Resp:  [16-29] 24 (11/15 0810) BP: (87-145)/(55-100) 131/71 mmHg (11/15 0800) SpO2:  [95 %-100 %] 100 % (11/15 0516) FiO2 (%):  [28 %] 28 % (11/15 0810) Weight:  [149.6 kg (329 lb 12.9 oz)] 149.6 kg (329 lb 12.9 oz) (11/15 0408)  HEMODYNAMICS:   VENTILATOR SETTINGS: Vent Mode:  [-]  FiO2 (%):  [28 %] 28 %  INTAKE / OUTPUT: Intake/Output     11/14 0701 - 11/15 0700 11/15 0701 - 11/16 0700   I.V. (mL/kg) 30 (0.2)    IV Piggyback 50    Total Intake(mL/kg) 80 (0.5)    Other 3231    Total Output 3231     Net -3151          Stool Occurrence 1 x     PHYSICAL EXAMINATION:  Gen: No distress, comfortable HEENT: NCAT, tracheostomy site intact, no bleeding  PULM: Bilateral diminished air entry CV: RRR, distant heart sounds AB: Obese, non-tender, bowel sounds present Ext: Warm, trace edema Derm:  Skin intact  Neuro: Non focal  LABS:  CBC  Recent Labs Lab 08/20/13 1131 08/21/13 0500 08/22/13 0500  WBC 12.2* 13.4* 15.2*  HGB 10.5* 9.5* 11.4*  HCT 33.1* 29.1* 34.6*  PLT 217 200 231   Coag's  Recent Labs Lab 08/20/13 1131  INR 1.09   BMET  Recent Labs Lab 08/20/13 1131 08/20/13 1506 08/21/13 0500 08/24/13 0645  NA 132*  --  137 135  K 7.3* 3.3* 4.3 5.3*  CL 89*  --  93* 87*  CO2 28  --  25 21  BUN 69*  --  60* 76*  CREATININE 13.21*  --  12.13* 13.20*  GLUCOSE 296*  --  157* 189*   Electrolytes  Recent Labs Lab 08/20/13 0355 08/20/13 1131 08/21/13 0500 08/24/13 0645  CALCIUM 8.4 8.6 8.1* 10.7*  MG  --  2.4 1.9  --   PHOS 11.3* 12.7* 6.1*  --    Sepsis Markers  Recent Labs Lab 08/19/13 2245 08/19/13 2312 08/20/13 1130  LATICACIDVEN 0.7 1.00 1.0   ABG  Recent Labs Lab 08/20/13 1231 08/20/13 2224 08/21/13 1421  PHART 7.251* 7.387 7.349*  PCO2ART 65.6* 47.3* 55.9*  PO2ART 332.0* 86.0 97.0   Liver Enzymes  Recent Labs Lab 08/19/13 1901 08/20/13 0355 08/20/13 1131  AST 17 18 20   ALT 19 18  18  ALKPHOS 61 60 54  BILITOT 0.3 0.3 0.4  ALBUMIN 4.1 3.8 3.6   Cardiac Enzymes  Recent Labs Lab 08/19/13 2012 08/20/13 0016 08/20/13 1216  TROPONINI  --  <0.30 <0.30  PROBNP 5311.0*  --   --    Glucose  Recent Labs Lab 08/22/13 0834 08/22/13 1319 08/22/13 1619 08/22/13 2036 08/23/13 0813 08/23/13 1257  GLUCAP 227* 139* 135* 137* 155* 149*   CXR 08/21/13>  IMPRESSION:  Endotracheal tube has been retracted and is in the midtrachea approximately 5 cm above the carina.  Resolving right upper lobe atelectasis. Stable cardiomegaly with vascular congestion and residual basilar atelectasis    ASSESSMENT / PLAN:  PULMONARY A: Acute on chronic hypercapnic respiratory failure. Difficult Airway, sp emergent bedside trach, revised by ENT. Obesity hypoventilation syndrome. Acute tracheobronchitis.  Pulmonary edema in setting of  ESRD / fluid overload - resolved. P:   Not a candidate for de-cannulation  Goal SpO2>92 Supplemental oxygen via trach collar Vent PRN PMV Trach care teaching   CARDIOVASCULAR A: Suspected acute on chronic diastolic heart failure. P:  Hold norvasc TTE   RENAL A:  ESRD on HD. P:   Renal following  INFECTIOUS A:  Acute tracheobronchitis P:   Cx / abx as above Will d/c vanc   GI A: Morbid obesity. Suspected dysphagia. P: FEES  Advance diet as tolerated   HEMATOLOGIC A:  Anemia, chronic, stable. P:  Trend CBC Heparin for DVT Px Aranesp  ENDOCRINE A: Diabetes mellitus. P: SSI  NEUROLOGIC A:  Acute encephalopathy - resolving.  P:   Supportive care     Noralee Space, MD [facility] Pager: [phone] 08/24/2013, 10:53 AM

## 2013-08-24 NOTE — Progress Notes (Signed)
Speech Language Pathology  Patient Details Name: MCCORMICK RACHLIN MRN: [id number] DOB: [birth year] Today's Date: 08/24/2013 Time:  -    FEES unavailable to be completed, therefore, bedside swallow assessment performed.  Please see report for details.  Orbie Pyo [place].Ed [employer] [phone]  08/24/2013

## 2013-08-24 NOTE — Procedures (Signed)
Objective Swallowing Evaluation: Bedside swallow evaluation  Patient Details  Name: Seth [name] MRN: [id number] Date of Birth: Feb 22, 1966  Today's Date: 08/24/2013 Time: [id number] SLP Time Calculation (min): 40 min  Past Medical History:  Past Medical History  Diagnosis Date  . Diabetes mellitus   . Hypertension   . Chronic kidney disease   . 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 Av fistula placement  05/03/2012    Procedure: ARTERIOVENOUS (AV) FISTULA CREATION;  Surgeon: Mal [name], MD;  Location: [facility];  Service: Vascular;  Laterality: Right;  . Tracheostomy tube placement N/A 08/20/2013    Procedure: TRACHEOSTOMY Revision;  Surgeon: Melida Quitter, MD;  Location: [facility];  Service: ENT;  Laterality: N/A;   HPI:  Seth [name] is a 47 y.o. male was brought to the ER after patient was found to have increasing shortness of breath. Patient has been having dialysis regularly and states he has not missed his dialysis. He has been having some nonproductive cough for last 2-3 days with some fever and chills. He also was recently placed on CPAP for his obstructive sleep apnea which he has been regularly using for last 2 weeks. He has noticed some voice changes with his cough. In the ER patient was found to be lethargic and ABG showed hypercapnia and patient was placed on BiPAP. After a brief time patient's mental status improved largely. Critical care was consulted and at this time patient has been diabetic for further management. Patient denies any chest pain nausea vomiting abdominal pain. In the ER patient was found to be febrile and was placed on empiric antibiotics after blood cultures were taken. Chest x-ray shows features consistent with chest congestion.     Assessment / Plan / Recommendation Clinical Impression       Treatment Recommendation  Therapy as outlined in treatment plan below    Diet Recommendation Regular;Thin liquid   Liquid Administration via: Cup;Straw Medication Administration: Whole meds with liquid Supervision: Patient able to self feed;Intermittent supervision to cue for compensatory strategies Compensations: Slow rate;Small sips/bites Postural Changes and/or Swallow Maneuvers: Seated upright 90 degrees;Upright 30-60 min after meal    Other  Recommendations Oral Care Recommendations: Oral care BID   Follow Up Recommendations  24 hour supervision/assistance    Frequency and Duration min 2x/week  2 weeks   Pertinent Vitals/Pain WL          Reason for Referral     Oral Phase     Pharyngeal Phase    Cervical Esophageal Phase    GO              Orbie Pyo Briony Parveen M.Ed [employer] [phone]  08/24/2013

## 2013-08-24 NOTE — Progress Notes (Addendum)
Admit: 08/19/2013 LOS: 5  3M ESRD, morbid obesity, admitted with hypercapneic RF, volume overload, required emergent surgical airway (11/11).    Subjective:  Doing well HD yesterday, UF 3.2L limited by some cramping In chair, 28% TC.  Able to ambulate Using PM valve and speaking well  11/14 0701 - 11/15 0700 In: 80 [I.V.:30; IV Piggyback:50] Out: 3231   Filed Weights   08/22/13 0500 08/23/13 0500 08/24/13 0408  Weight: 151.7 kg (334 lb 7 oz) 161.4 kg (355 lb 13.2 oz) 149.6 kg (329 lb 12.9 oz)    Current meds: reviewed including cefepime Current Labs: reviewed  Outpt HD Orders  Unit: [place] MWF Time: 5h 46min  Dialyzer: F200  EDW: 148.5kg  K/Ca: 2k/2.25Ca  Access: AVF RFA  BFR: 550  UF Proflie: none  VDRA: Hectorol 10 qTx  EPO: 3000 qTx  IV Fe: Venofer 50 qWk   Physical Exam:  Blood pressure 131/71, pulse 87, temperature 98.5 F (36.9 C), temperature source Oral, resp. rate 24, height 5\' 8"  (1.727 m), weight 149.6 kg (329 lb 12.9 oz), SpO2 100.00%. GEN: Morbidly obese, awake and interactive, cheerful ENT: Trach in place, on trach collar and with PM valve on CV: RRR, nl s1s2 LUNGS: coarse bs b/l ABD: obese, soft NEURO: interactive, mae, nf EXT: RFA AVF +b/t EYES: EOMI, muddy sclera  Assessment/Plan 1. ESRD on HD MWF: On schedule.  K 5.3 this AM.  Check BMP tomorrow. Nearing outpt EDW at treatments.  Next on Monday for now.   2. Anemia of CKD: Cont ESA as aranesp 40 qwk, next 11/19 3. HTN/Vol: as above.  Outpt EDW will be challenged as able in house 4. Hypercapneif RF s/p tracheostomy and URI: per CCM and ENT. 5. 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  Starting to eat.  AM phos tomorrow.      [name] MD 08/24/2013, 10:11 AM   Recent Labs Lab 08/20/13 0355 08/20/13 1131 08/20/13 1506 08/21/13 0500 08/24/13 0645  NA 130* 132*  --  137 135  K 6.0* 7.3* 3.3* 4.3 5.3*  CL 89* 89*  --  93* 87*  CO2 24 28  --  25 21   GLUCOSE 313* 296*  --  157* 189*  BUN 63* 69*  --  60* 76*  CREATININE 12.27* 13.21*  --  12.13* 13.20*  CALCIUM 8.4 8.6  --  8.1* 10.7*  PHOS 11.3* 12.7*  --  6.1*  --     Recent Labs Lab 08/20/13 0355 08/20/13 1131 08/21/13 0500 08/22/13 0500  WBC 13.3* 12.2* 13.4* 15.2*  NEUTROABS 9.8*  --   --  11.4*  HGB 11.3* 10.5* 9.5* 11.4*  HCT 34.7* 33.1* 29.1* 34.6*  MCV 97.5 96.8 94.2 96.4  PLT 230 217 200 231    Current Facility-Administered Medications  Medication Dose Route Frequency Provider Last Rate Last Dose  . 0.9 %  sodium chloride infusion  100 mL Intravenous PRN Rexene Agent, MD      . 0.9 %  sodium chloride infusion  100 mL Intravenous PRN Rexene Agent, MD      . acetaminophen (TYLENOL) solution 650 mg  650 mg Oral Q6H PRN Raylene Miyamoto, MD      . antiseptic oral rinse (BIOTENE) solution 15 mL  15 mL Mouth Rinse QID Juanito Doom, MD   15 mL at 08/24/13 0400  . ceFEPIme (MAXIPIME) 1 g in dextrose 5 % 50 mL IVPB  1 g Intravenous Q24H Raylene Miyamoto, MD   1 g at 08/23/13 2004  . chlorhexidine (PERIDEX) 0.12 % solution 15 mL  15 mL Mouth Rinse BID Juanito Doom, MD   15 mL at 08/24/13 0844  . darbepoetin (ARANESP) injection 40 mcg  40 mcg Intravenous Q Wed-HD Rexene Agent, MD   40 mcg at 08/21/13 1631  . fentaNYL (SUBLIMAZE) bolus via infusion 50-100 mcg  50-100 mcg Intravenous Q1H PRN Rush Farmer, MD   50 mcg at 08/21/13 0905  . heparin injection 1,000 Units  1,000 Units Dialysis PRN Rexene Agent, MD      . heparin injection 15,500 Units  100 Units/kg Dialysis PRN Rexene Agent, MD   3,100 Units at 08/21/13 1351  . heparin injection 5,000 Units  5,000 Units Subcutaneous Q8H Raylene Miyamoto, MD   5,000 Units at 08/24/13 [phone]  . insulin aspart (novoLOG) injection 0-9 Units  0-9 Units Subcutaneous TID WC [name], MD   1 Units at 08/24/13 [phone]  . lidocaine (PF) (XYLOCAINE) 1 % injection 5 mL  5 mL Intradermal PRN Rexene Agent, MD       . lidocaine-prilocaine (EMLA) cream 1 application  1 application Topical PRN Rexene Agent, MD      . ondansetron [facility]) tablet 4 mg  4 mg Oral Q6H PRN [name], MD       Or  . ondansetron [facility]) injection 4 mg  4 mg Intravenous Q6H PRN [name], MD      . pantoprazole (PROTONIX) injection 40 mg  40 mg Intravenous Q24H Donita Brooks, NP   40 mg at 08/23/13 2240  . pentafluoroprop-tetrafluoroeth (GEBAUERS) aerosol 1 application  1 application Topical PRN Rexene Agent, MD

## 2013-08-24 NOTE — Evaluation (Signed)
Physical Therapy Evaluation Patient Details Name: Seth [name] MRN: [id number] DOB: [birth year] Today's Date: 08/24/2013 Time: [id number] PT Time Calculation (min): 34 min  PT Assessment / Plan / Recommendation History of Present Illness  Pt admit for respiratory failure with trach.  HD M,W,F.    Clinical Impression  Pt admitted with above. Pt currently with functional limitations due to the deficits listed below (see PT Problem List). Pt will need HHPT f/u and bariatric 3N1 and RW.  Overall doing very well.    Pt will benefit from skilled PT to increase their independence and safety with mobility to allow discharge to the venue listed below.     PT Assessment  Patient needs continued PT services    Follow Up Recommendations  Home health PT;Supervision/Assistance - 24 hour (safety eval)                Equipment Recommendations  Rolling walker with 5" wheels;3in1 (PT) (Equipment needs to be bariatric as pt is 329 lbs.  )         Frequency Min 3X/week    Precautions / Restrictions Precautions Precautions: Fall Restrictions Weight Bearing Restrictions: No   Pertinent Vitals/Pain VSS, No pain      Mobility  Bed Mobility Bed Mobility: Not assessed Transfers Transfers: Sit to Stand;Stand to Sit Sit to Stand: 5: Supervision;With upper extremity assist;With armrests;From chair/3-in-1 Stand to Sit: 5: Supervision;With upper extremity assist;With armrests;To chair/3-in-1 Details for Transfer Assistance: Pt needed cues for hand placement.Uses momentum to perform sit to stand. Ambulation/Gait Ambulation/Gait Assistance: 4: Min guard Ambulation Distance (Feet): 165 Feet Assistive device: Rolling walker Ambulation/Gait Assistance Details: Pt ambulated with RW with cues to stay close to RW.  DOE 3/4 at end of walk.  OVerall does well with ambulation.   Gait Pattern: Step-through pattern;Decreased stride length;Wide base of support;Trunk flexed Stairs: No Wheelchair  Mobility Wheelchair Mobility: No         PT Diagnosis: Generalized weakness  PT Problem List: Decreased activity tolerance;Decreased balance;Decreased mobility;Decreased knowledge of use of DME;Decreased safety awareness;Decreased knowledge of precautions PT Treatment Interventions: DME instruction;Gait training;Functional mobility training;Therapeutic activities;Therapeutic exercise;Balance training;Patient/family education     PT Goals(Current goals can be found in the care plan section) Acute Rehab PT Goals Patient Stated Goal: to go home PT Goal Formulation: With patient Time For Goal Achievement: 08/31/13 Potential to Achieve Goals: Good  Visit Information  Last PT Received On: 08/24/13 Assistance Needed: +1 History of Present Illness: Pt admit for respiratory failure with trach.  HD M,W,F.         Prior Functioning  Home Living Family/patient expects to be discharged to:: Private residence Living Arrangements: Other relatives Available Help at Discharge: Family;Available 24 hours/day (mom) Type of Home: House Home Access: Stairs to enter [employer] of Steps: 1 Entrance Stairs-Rails: None Home Layout: One level Home Equipment: None Prior Function Level of Independence: Independent Communication Communication: No difficulties;Tracheostomy;Passy-Muir valve    Cognition  Cognition Arousal/Alertness: Awake/alert Behavior During Therapy: WFL for tasks assessed/performed Overall Cognitive Status: Within Functional Limits for tasks assessed    Extremity/Trunk Assessment Upper Extremity Assessment Upper Extremity Assessment: Defer to OT evaluation Lower Extremity Assessment Lower Extremity Assessment: Generalized weakness Cervical / Trunk Assessment Cervical / Trunk Assessment: Normal   Balance Static Standing Balance Static Standing - Balance Support: Bilateral upper extremity supported;During functional activity Static Standing - Level of Assistance:  5: Stand by assistance Static Standing - Comment/# of Minutes: 2  End of Session PT - End of  Session Equipment Utilized During Treatment: Gait belt;Oxygen Activity Tolerance: Patient limited by fatigue Patient left: in chair;with call bell/phone within reach Nurse Communication: Mobility status       Seth [name] 08/24/2013, 4:03 PM [facility] Acute Rehabilitation [phone] (pager)

## 2013-08-25 ENCOUNTER — Inpatient Hospital Stay (HOSPITAL_COMMUNITY): Payer: Medicare Other

## 2013-08-25 DIAGNOSIS — J9600 Acute respiratory failure, unspecified whether with hypoxia or hypercapnia: Secondary | ICD-10-CM | POA: Diagnosis not present

## 2013-08-25 DIAGNOSIS — I120 Hypertensive chronic kidney disease with stage 5 chronic kidney disease or end stage renal disease: Secondary | ICD-10-CM | POA: Diagnosis not present

## 2013-08-25 DIAGNOSIS — N186 End stage renal disease: Secondary | ICD-10-CM | POA: Diagnosis not present

## 2013-08-25 DIAGNOSIS — D631 Anemia in chronic kidney disease: Secondary | ICD-10-CM | POA: Diagnosis not present

## 2013-08-25 LAB — GLUCOSE, CAPILLARY
Glucose-Capillary: 283 mg/dL — ABNORMAL HIGH (ref 70–99)
Glucose-Capillary: 345 mg/dL — ABNORMAL HIGH (ref 70–99)
Glucose-Capillary: 299 mg/dL — ABNORMAL HIGH (ref 70–99)

## 2013-08-25 LAB — BASIC METABOLIC PANEL
BUN: 103 mg/dL — ABNORMAL HIGH (ref 6–23)
CO2: 19 mEq/L (ref 19–32)
Creatinine, Ser: 14.79 mg/dL — ABNORMAL HIGH (ref 0.50–1.35)
GFR calc Af Amer: 4 mL/min — ABNORMAL LOW (ref >90)
GFR calc non Af Amer: 3 mL/min — ABNORMAL LOW (ref >90)
Glucose, Bld: 219 mg/dL — ABNORMAL HIGH (ref 70–99)
Potassium: 4.8 mEq/L (ref 3.5–5.1)
Sodium: 127 mEq/L — ABNORMAL LOW (ref 135–145)

## 2013-08-25 MED ORDER — CALCIUM ACETATE 667 MG PO CAPS
1334.0000 mg | ORAL_CAPSULE | Freq: Three times a day (TID) | ORAL | Status: DC
  Administered 2013-08-25 – 2013-08-28 (×6): 1334 mg via ORAL
  Filled 2013-08-25 (×12): qty 2

## 2013-08-25 MED ORDER — PANTOPRAZOLE SODIUM 40 MG PO TBEC
40.0000 mg | DELAYED_RELEASE_TABLET | Freq: Every day | ORAL | Status: DC
  Administered 2013-08-25 – 2013-08-28 (×2): 40 mg via ORAL
  Filled 2013-08-25 (×3): qty 1

## 2013-08-25 NOTE — Progress Notes (Signed)
IV team attempted to place a peripheral IV prior to removing the femoral line without success. The femoral line is still in place.

## 2013-08-25 NOTE — Progress Notes (Signed)
Admit: 08/19/2013 LOS: 6  21M ESRD, morbid obesity, admitted with hypercapneic RF, volume overload, required emergent surgical airway (11/11).    Subjective:  Pt cont to feel well TC, 28%, in chair Off Vanc.  Still on cefepime K4.8 this AM   11/15 0701 - 11/16 0700 In: 770 [P.O.:720; IV Piggyback:50] Out: 450 [Urine:450]  Filed Weights   08/23/13 0500 08/24/13 0408 08/25/13 0408  Weight: 161.4 kg (355 lb 13.2 oz) 149.6 kg (329 lb 12.9 oz) 149.1 kg (328 lb 11.3 oz)    Current meds: reviewed including cefepime Current Labs: reviewed  Outpt HD Orders  Unit: [place] MWF Time: 5h 50min  Dialyzer: F200  EDW: 148.5kg  K/Ca: 2k/2.25Ca  Access: AVF RFA  BFR: 550  UF Proflie: none  VDRA: Hectorol 10 qTx  EPO: 3000 qTx  IV Fe: Venofer 50 qWk   Physical Exam:  Blood pressure 136/7, pulse 81, temperature 98 F (36.7 C), temperature source Axillary, resp. rate 23, height 5\' 8"  (1.727 m), weight 149.1 kg (328 lb 11.3 oz), SpO2 96.00%. GEN: Morbidly obese, awake and interactive, cheerful ENT: Trach in place, on trach collar and with PM valve on CV: RRR, nl s1s2 LUNGS: coarse bs b/l ABD: obese, soft NEURO: interactive, mae, nf EXT: RFA AVF +b/t EYES: EOMI, muddy sclera  Assessment/Plan 1. ESRD on HD MWF: On schedule.  Next tx tomorrow.  Will challenge EDW by 1kg.   2. Anemia of CKD: Cont ESA as aranesp 40 qwk, next 11/19 3. HTN/Vol: as above.  As above 4. Hypercapneif RF s/p tracheostomy and URI/PNA: per CCM and ENT. 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  Ca 9.7.  Phos 12.1.  Restart PhosLo.  Have been holding hectorol to this point.  Eating well.      [name] MD 08/25/2013, 8:25 AM   Recent Labs Lab 08/20/13 1131  08/21/13 0500 08/24/13 0645 08/25/13 0700  NA 132*  --  137 135 127*  K 7.3*  < > 4.3 5.3* 4.8  CL 89*  --  93* 87* 81*  CO2 28  --  25 21 19   GLUCOSE 296*  --  157* 189* 219*  BUN 69*  --  60* 76* PENDING  CREATININE 13.21*  --  12.13* 13.20* 14.79*   CALCIUM 8.6  --  8.1* 10.7* 9.7  PHOS 12.7*  --  6.1*  --  12.1*  < > = values in this interval not displayed.  Recent Labs Lab 08/20/13 0355 08/20/13 1131 08/21/13 0500 08/22/13 0500  WBC 13.3* 12.2* 13.4* 15.2*  NEUTROABS 9.8*  --   --  11.4*  HGB 11.3* 10.5* 9.5* 11.4*  HCT 34.7* 33.1* 29.1* 34.6*  MCV 97.5 96.8 94.2 96.4  PLT 230 217 200 231    Current Facility-Administered Medications  Medication Dose Route Frequency Provider Last Rate Last Dose  . 0.9 %  sodium chloride infusion  100 mL Intravenous PRN Rexene Agent, MD      . 0.9 %  sodium chloride infusion  100 mL Intravenous PRN Rexene Agent, MD      . acetaminophen (TYLENOL) solution 650 mg  650 mg Oral Q6H PRN Raylene Miyamoto, MD      . antiseptic oral rinse (BIOTENE) solution 15 mL  15 mL Mouth Rinse QID Juanito Doom, MD   15 mL at 08/25/13 0000  . calcium acetate (PHOSLO) capsule 1,334 mg  1,334 mg Oral TID WC Rexene Agent, MD      .  ceFEPIme (MAXIPIME) 1 g in dextrose 5 % 50 mL IVPB  1 g Intravenous Q24H Raylene Miyamoto, MD   1 g at 08/24/13 2010  . chlorhexidine (PERIDEX) 0.12 % solution 15 mL  15 mL Mouth Rinse BID Juanito Doom, MD   15 mL at 08/24/13 2010  . darbepoetin (ARANESP) injection 40 mcg  40 mcg Intravenous Q Wed-HD Rexene Agent, MD   40 mcg at 08/21/13 1631  . fentaNYL (SUBLIMAZE) bolus via infusion 50-100 mcg  50-100 mcg Intravenous Q1H PRN Rush Farmer, MD   50 mcg at 08/21/13 0905  . heparin injection 1,000 Units  1,000 Units Dialysis PRN Rexene Agent, MD      . heparin injection 15,500 Units  100 Units/kg Dialysis PRN Rexene Agent, MD   3,100 Units at 08/21/13 1351  . heparin injection 5,000 Units  5,000 Units Subcutaneous Q8H Raylene Miyamoto, MD   5,000 Units at 08/25/13 0600  . insulin aspart (novoLOG) injection 0-9 Units  0-9 Units Subcutaneous TID WC [name], MD   2 Units at 08/24/13 1708  . lidocaine (PF) (XYLOCAINE) 1 % injection 5 mL  5 mL Intradermal  PRN Rexene Agent, MD      . lidocaine-prilocaine (EMLA) cream 1 application  1 application Topical PRN Rexene Agent, MD      . ondansetron [facility]) tablet 4 mg  4 mg Oral Q6H PRN [name], MD       Or  . ondansetron [facility]) injection 4 mg  4 mg Intravenous Q6H PRN [name], MD      . pantoprazole (PROTONIX) injection 40 mg  40 mg Intravenous Q24H Donita Brooks, NP   40 mg at 08/24/13 2148  . pentafluoroprop-tetrafluoroeth (GEBAUERS) aerosol 1 application  1 application Topical PRN Rexene Agent, MD

## 2013-08-25 NOTE — Progress Notes (Signed)
PULMONARY  / CRITICAL CARE MEDICINE  Name: Seth [name] MRN: [id number] DOB: 04-06-1966    ADMISSION DATE:  08/19/2013 CONSULTATION DATE:  08/19/2013  REFERRING MD :  EDP PRIMARY SERVICE: PCCM  CHIEF COMPLAINT:  Cough, congestion  BRIEF PATIENT DESCRIPTION: 47 yo with OSA, ESRD presented to the [facility] ED on 11/10 with fever, cough, and chest congestion.  In the ED he developed hypercapnic respiratory failure requiring BIPAP briefly.  PCCM asked to evaluate for ICU.  SIGNIFICANT EVENTS / STUDIES:  11/11   Admited with SOB, cough, fever. Decompensated on SDU requiring emergent surgical airway. 11/11   OR >>> Revision of tracheostomy 11/12   Transferred to SDU  LINES / TUBES: R Fem TLC 11/11>>>  R Rad A line 11/11>>> 11/12 OETT in Trach 11/11>>>11/11 Trach 11/11 >>>  CULTURES: 11/11  Blood >>>  11/11  Urine >>>neg  ANTIBIOTICS: Cefepime 11/11 >>>  Vanco 11/11 >>>11/14 Cefepime 11/11 >>  SUBJECTIVE: No active issues overnight. Feels at baseline   VITAL SIGNS: Temp:  [97.5 F (36.4 C)-98.4 F (36.9 C)] 98 F (36.7 C) (11/16 0408) Pulse Rate:  [78-89] 81 (11/16 0805) Resp:  [17-24] 23 (11/16 0805) BP: (74-136)/(7-84) 136/7 mmHg (11/16 0408) SpO2:  [93 %-100 %] 96 % (11/16 0805) FiO2 (%):  [28 %] 28 % (11/16 0805) Weight:  [149.1 kg (328 lb 11.3 oz)] 149.1 kg (328 lb 11.3 oz) (11/16 0408)  HEMODYNAMICS:   VENTILATOR SETTINGS: Vent Mode:  [-]  FiO2 (%):  [28 %] 28 %  INTAKE / OUTPUT: Intake/Output     11/15 0701 - 11/16 0700 11/16 0701 - 11/17 0700   P.O. 720    I.V. (mL/kg)     IV Piggyback 50    Total Intake(mL/kg) 770 (5.2)    Urine (mL/kg/hr) 450 (0.1)    Other     Total Output 450     Net +320          Stool Occurrence 2 x     PHYSICAL EXAMINATION:  Gen: No distress, comfortable HEENT: NCAT, tracheostomy site intact, no bleeding  PULM: Bilateral diminished air entry CV: RRR, distant heart sounds AB: Obese, non-tender, bowel sounds present Ext:  Warm, trace edema Derm: Skin intact  Neuro: Non focal  LABS:  CBC  Recent Labs Lab 08/20/13 1131 08/21/13 0500 08/22/13 0500  WBC 12.2* 13.4* 15.2*  HGB 10.5* 9.5* 11.4*  HCT 33.1* 29.1* 34.6*  PLT 217 200 231   Coag's  Recent Labs Lab 08/20/13 1131  INR 1.09   BMET  Recent Labs Lab 08/21/13 0500 08/24/13 0645 08/25/13 0700  NA 137 135 127*  K 4.3 5.3* 4.8  CL 93* 87* 81*  CO2 25 21 19   BUN 60* 76* 103*  CREATININE 12.13* 13.20* 14.79*  GLUCOSE 157* 189* 219*   Electrolytes  Recent Labs Lab 08/20/13 1131 08/21/13 0500 08/24/13 0645 08/25/13 0700  CALCIUM 8.6 8.1* 10.7* 9.7  MG 2.4 1.9  --   --   PHOS 12.7* 6.1*  --  12.1*   Sepsis Markers  Recent Labs Lab 08/19/13 2245 08/19/13 2312 08/20/13 1130  LATICACIDVEN 0.7 1.00 1.0   ABG  Recent Labs Lab 08/20/13 1231 08/20/13 2224 08/21/13 1421  PHART 7.251* 7.387 7.349*  PCO2ART 65.6* 47.3* 55.9*  PO2ART 332.0* 86.0 97.0   Liver Enzymes  Recent Labs Lab 08/19/13 1901 08/20/13 0355 08/20/13 1131  AST 17 18 20   ALT 19 18 18   ALKPHOS 61 60 54  BILITOT 0.3 0.3  0.4  ALBUMIN 4.1 3.8 3.6   Cardiac Enzymes  Recent Labs Lab 08/19/13 2012 08/20/13 0016 08/20/13 1216  TROPONINI  --  <0.30 <0.30  PROBNP 5311.0*  --   --    Glucose  Recent Labs Lab 08/23/13 0813 08/23/13 1257 08/24/13 1303 08/24/13 1643 08/24/13 1957 08/25/13 0828  GLUCAP 155* 149* 341* 191* 269* 242*   CXR 08/21/13>  IMPRESSION:  Endotracheal tube has been retracted and is in the midtrachea approximately 5 cm above the carina.  Resolving right upper lobe atelectasis. Stable cardiomegaly with vascular congestion and residual basilar atelectasis    ASSESSMENT / PLAN:  PULMONARY A: Acute on chronic hypercapnic respiratory failure. Difficult Airway, sp emergent bedside trach, revised by ENT. Obesity hypoventilation syndrome. Acute tracheobronchitis.  Pulmonary edema in setting of ESRD / fluid overload -  resolved. P:   Not a candidate for de-cannulation  Goal SpO2>92 Supplemental oxygen via trach collar Vent PRN PMV Trach care teaching  F/u CXR ordered...  CARDIOVASCULAR A: Suspected acute on chronic diastolic heart failure. P:  Hold norvasc TTE   RENAL A:  ESRD on HD. P:   Renal following  INFECTIOUS A:  Acute tracheobronchitis P:   Cx / abx as above Will d/c vanc   GI A: Morbid obesity. Suspected dysphagia. P: FEES  Advance diet as tolerated   HEMATOLOGIC A:  Anemia, chronic, stable. P:  Trend CBC Heparin for DVT Px Aranesp  ENDOCRINE A: Diabetes mellitus. P: SSI  NEUROLOGIC A:  Acute encephalopathy - resolving.  P:   Supportive care     Noralee Space, MD [place] [facility] Pager: [phone] 08/25/2013, 9:31 AM

## 2013-08-26 DIAGNOSIS — N186 End stage renal disease: Secondary | ICD-10-CM | POA: Diagnosis not present

## 2013-08-26 LAB — CULTURE, BLOOD (ROUTINE X 2): Culture: NO GROWTH

## 2013-08-26 LAB — CBC
HCT: 27.3 % — ABNORMAL LOW (ref 39.0–52.0)
MCH: 31.2 pg (ref 26.0–34.0)
MCHC: 34.4 g/dL (ref 30.0–36.0)
MCV: 90.7 fL (ref 78.0–100.0)
Platelets: 189 10*3/uL (ref 150–400)
RBC: 3.01 MIL/uL — ABNORMAL LOW (ref 4.22–5.81)
RDW: 13.1 % (ref 11.5–15.5)

## 2013-08-26 LAB — RENAL FUNCTION PANEL
Albumin: 2.5 g/dL — ABNORMAL LOW (ref 3.5–5.2)
BUN: 96 mg/dL — ABNORMAL HIGH (ref 6–23)
Calcium: 8.1 mg/dL — ABNORMAL LOW (ref 8.4–10.5)
Creatinine, Ser: 13.15 mg/dL — ABNORMAL HIGH (ref 0.50–1.35)
Phosphorus: 9.1 mg/dL — ABNORMAL HIGH (ref 2.3–4.6)

## 2013-08-26 LAB — GLUCOSE, CAPILLARY
Glucose-Capillary: 250 mg/dL — ABNORMAL HIGH (ref 70–99)
Glucose-Capillary: 371 mg/dL — ABNORMAL HIGH (ref 70–99)
Glucose-Capillary: 474 mg/dL — ABNORMAL HIGH (ref 70–99)

## 2013-08-26 MED ORDER — INSULIN GLARGINE 100 UNIT/ML ~~LOC~~ SOLN
15.0000 [IU] | Freq: Every day | SUBCUTANEOUS | Status: DC
  Administered 2013-08-26 – 2013-08-27 (×2): 15 [IU] via SUBCUTANEOUS
  Filled 2013-08-26 (×4): qty 0.15

## 2013-08-26 MED ORDER — INSULIN ASPART 100 UNIT/ML ~~LOC~~ SOLN
0.0000 [IU] | SUBCUTANEOUS | Status: DC
  Administered 2013-08-26: 5 [IU] via SUBCUTANEOUS
  Administered 2013-08-26 (×2): 15 [IU] via SUBCUTANEOUS
  Administered 2013-08-27: 5 [IU] via SUBCUTANEOUS
  Administered 2013-08-27: 8 [IU] via SUBCUTANEOUS
  Administered 2013-08-27 (×2): 3 [IU] via SUBCUTANEOUS
  Administered 2013-08-27 – 2013-08-28 (×2): 5 [IU] via SUBCUTANEOUS
  Administered 2013-08-28: 15 [IU] via SUBCUTANEOUS
  Administered 2013-08-28: 3 [IU] via SUBCUTANEOUS
  Administered 2013-08-28: 5 [IU] via SUBCUTANEOUS
  Administered 2013-08-28: 3 [IU] via SUBCUTANEOUS

## 2013-08-26 MED ORDER — FENTANYL CITRATE 0.05 MG/ML IJ SOLN
50.0000 ug | Freq: Once | INTRAMUSCULAR | Status: AC
  Administered 2013-08-26: 50 ug via INTRAVENOUS
  Filled 2013-08-26: qty 2

## 2013-08-26 MED ORDER — BENZONATATE 100 MG PO CAPS
100.0000 mg | ORAL_CAPSULE | Freq: Two times a day (BID) | ORAL | Status: DC | PRN
  Administered 2013-08-26 – 2013-08-28 (×3): 100 mg via ORAL
  Filled 2013-08-26 (×5): qty 1

## 2013-08-26 NOTE — Progress Notes (Signed)
PULMONARY  / CRITICAL CARE MEDICINE  Name: Seth [name] MRN: [id number] DOB: 02-22-1966    ADMISSION DATE:  08/19/2013 CONSULTATION DATE:  08/19/2013  REFERRING MD :  EDP PRIMARY SERVICE: PCCM  CHIEF COMPLAINT:  Cough, congestion  BRIEF PATIENT DESCRIPTION: 47 yo with OSA, ESRD presented to the [facility] ED on 11/10 with fever, cough, and chest congestion.  In the ED he developed hypercapnic respiratory failure requiring BIPAP briefly.  PCCM asked to evaluate for ICU.  SIGNIFICANT EVENTS / STUDIES:  11/11   Admited with SOB, cough, fever. Decompensated on SDU requiring emergent surgical airway. 11/11   OR >>> Revision of tracheostomy 11/12   Transferred to SDU  LINES / TUBES: R Fem TLC 11/11>>>  R Rad A line 11/11>>> 11/12 OETT in Trach 11/11>>>11/11 Trach 11/11 >>>  CULTURES: 11/11  Blood >>>  11/11  Urine >>>neg  ANTIBIOTICS: Cefepime 11/11 >>>  Vanco 11/11 >>>11/14 Cefepime 11/11 >>  SUBJECTIVE: No active issues overnight. Feels at baseline   VITAL SIGNS: Temp:  [97.8 F (36.6 C)-98.2 F (36.8 C)] 97.8 F (36.6 C) (11/17 0820) Pulse Rate:  [76-86] 83 (11/17 1200) Resp:  [14-29] 29 (11/17 1200) BP: (106-145)/(55-91) 106/64 mmHg (11/17 1200) SpO2:  [94 %-100 %] 95 % (11/17 1200) FiO2 (%):  [28 %] 28 % (11/17 1200)  HEMODYNAMICS:   VENTILATOR SETTINGS: Vent Mode:  [-]  FiO2 (%):  [28 %] 28 %  INTAKE / OUTPUT: Intake/Output     11/16 0701 - 11/17 0700 11/17 0701 - 11/18 0700   P.O. 720    IV Piggyback 50    Total Intake(mL/kg) 770 (5.2)    Urine (mL/kg/hr)     Total Output       Net +770           PHYSICAL EXAMINATION:  Gen: No distress, comfortable HEENT: NCAT, tracheostomy site intact, no bleeding  PULM: Bilateral diminished air entry CV: RRR, distant heart sounds AB: Obese, non-tender, bowel sounds present Ext: Warm, trace edema Derm: Skin intact  Neuro: Non focal  LABS:  CBC  Recent Labs Lab 08/21/13 0500 08/22/13 0500 08/26/13 0903   WBC 13.4* 15.2* 11.9*  HGB 9.5* 11.4* 9.4*  HCT 29.1* 34.6* 27.3*  PLT 200 231 189   Coag's  Recent Labs Lab 08/20/13 1131  INR 1.09   BMET  Recent Labs Lab 08/24/13 0645 08/25/13 0700 08/26/13 0903  NA 135 127* 128*  K 5.3* 4.8 4.5  CL 87* 81* 86*  CO2 21 19 21   BUN 76* 103* 96*  CREATININE 13.20* 14.79* 13.15*  GLUCOSE 189* 219* 233*   Electrolytes  Recent Labs Lab 08/20/13 1131 08/21/13 0500 08/24/13 0645 08/25/13 0700 08/26/13 0903  CALCIUM 8.6 8.1* 10.7* 9.7 8.1*  MG 2.4 1.9  --   --   --   PHOS 12.7* 6.1*  --  12.1* 9.1*   Sepsis Markers  Recent Labs Lab 08/19/13 2245 08/19/13 2312 08/20/13 1130  LATICACIDVEN 0.7 1.00 1.0   ABG  Recent Labs Lab 08/20/13 1231 08/20/13 2224 08/21/13 1421  PHART 7.251* 7.387 7.349*  PCO2ART 65.6* 47.3* 55.9*  PO2ART 332.0* 86.0 97.0   Liver Enzymes  Recent Labs Lab 08/19/13 1901 08/20/13 0355 08/20/13 1131 08/26/13 0903  AST 17 18 20   --   ALT 19 18 18   --   ALKPHOS 61 60 54  --   BILITOT 0.3 0.3 0.4  --   ALBUMIN 4.1 3.8 3.6 2.5*   Cardiac Enzymes  Recent  Labs Lab 08/19/13 2012 08/20/13 0016 08/20/13 1216  TROPONINI  --  <0.30 <0.30  PROBNP 5311.0*  --   --    Glucose  Recent Labs Lab 08/24/13 1643 08/24/13 1957 08/25/13 0828 08/25/13 1209 08/25/13 1645 08/25/13 2154  GLUCAP 191* 269* 242* 283* 345* 299*   CXR 08/21/13>  IMPRESSION:  Endotracheal tube has been retracted and is in the midtrachea approximately 5 cm above the carina.  Resolving right upper lobe atelectasis. Stable cardiomegaly with vascular congestion and residual basilar atelectasis  ASSESSMENT / PLAN:  PULMONARY A: Acute on chronic hypercapnic respiratory failure. Difficult Airway, sp emergent bedside trach, revised by ENT. Obesity hypoventilation syndrome. Acute tracheobronchitis.  Pulmonary edema in setting of ESRD / fluid overload - resolved. P:   - Not a candidate for de-cannulation unless looses a  significant amount of weight. - Goal SpO2>92. - Supplemental oxygen via trach collar. - Vent PRN. - PMV. Lurline Idol care teaching underway.  CARDIOVASCULAR A: Suspected acute on chronic diastolic heart failure. P:  - Hold norvasc. - TTE EF 55-60%.   RENAL A:  ESRD on HD. P:   - Renal following.  INFECTIOUS A:  Acute tracheobronchitis P:   - Cx / abx as above - D/c vanc continue cefepime for now.  GI A: Morbid obesity. Suspected dysphagia. P: - FEES. - Advance diet as tolerated  HEMATOLOGIC A:  Anemia, chronic, stable. P:  - Trend CBC. - Heparin for DVT Px. - Aranesp.  ENDOCRINE A: Diabetes mellitus. P: - SSI.  NEUROLOGIC A:  Acute encephalopathy - resolving.  P:   - Supportive care.  Will likely d/c in AM once trach teaching is complete.  Rush Farmer, M.D. [facility] Pulmonary/Critical Care Medicine. Pager: [phone]. After hours pager: [phone].

## 2013-08-26 NOTE — Progress Notes (Signed)
[place] Progress Note Patient Name: Seth [name] DOB: [birth year] MRN: [id number]  Date of Service  08/26/2013   HPI/Events of Note  C/O of cough and back pain not relieved with tylenol   eICU Interventions  Plan: Order for prn Tessalon Pearles One time dose of fentanyl 50 mcg IV for back pain   Intervention Category Minor Interventions: Routine modifications to care plan (e.g. PRN medications for pain, fever)  DETERDING,ELIZABETH 08/26/2013, 12:17 AM

## 2013-08-26 NOTE — Discharge Summary (Signed)
Physician Discharge Summary  [name] MRN: [id number] DOB/AGE: 1966-02-27 47 y.o.  Admit date: 08/19/2013 Discharge date: 08/28/2013    Discharge Diagnoses:  Acute on Chronic Hypercapnic Respiratory Failure Difficult Airway Tracheostomy Status Obesity Hypoventilation Syndrome Acute Tracheobronchitis Pulmonary Edema / Volume Overload Suspected acute on chronic diastolic CHF ESRD Morbid Obesity Dysphagia Anemia Diabetes Mellitus Acute Encephalopathy                                                                       DISCHARGE PLAN BY DIAGNOSIS     Acute on Chronic Hypercapnic Respiratory Failure Difficult Airway Tracheostomy Status Obesity Hypoventilation Syndrome Acute Tracheobronchitis Pulmonary Edema / Volume Overload  Discharge Plan: -would not consider decannulation until patient had significant weight / risk reduction -continue SLP for ongoing trach needs  -follow up with Dr. Redmond Baseman for trach change in office  -trach care QD with inner cannula change & PRN -suction PRN -add nasal steroid at d/c for congestion -Would recommend he not be de-cannulated unless he has had significant weight reduction (>150lbs) due to the difficultly with airway during intubation.  Even then, would not de-cannulate without coordinated effort of ENT & Pulmonary.      Suspected acute on chronic diastolic CHF  Discharge Plan: -continue norvasc  ESRD  Discharge Plan: -continue HD as scheduled  Morbid Obesity Dysphagia  Discharge Plan: -SLP efforts -regular texture diet / thin liquids, small sips -encourage weight loss   Anemia  Discharge Plan: -follow up with PCP post discharge -follow H/H  Diabetes Mellitus  Discharge Plan: -Continue SSI  Acute Encephalopathy Deconditioning   Discharge Plan: -home PT -mobilize as able                                     DISCHARGE SUMMARY   Seth [name] is a 47 y.o. y/o male with a PMH of OSA,  ESRD, and morbid obesity who presented to the [facility] ED on 11/10 with fever, cough, and chest congestion. In the ED he developed hypercapnic respiratory failure requiring BIPAP briefly with improvement in mental / respiratory status. He was admitted to SDU per TRH.  Findings consistent with pulmonary edema / volume overload in the setting of ESRD complicated by underlying OSA / OSH.  Unfortunately, he clinically worsened from a respiratory standpoint and was transferred to ICU.  Given worsening of clinical status, attempts were made at intubation but were unsuccessful.  Patient is a very difficult airway (small mouth, short thick neck, redundant posterior oral tissue.  Decision was made to emergently place surgical airway at bedside and an endotracheal tube was placed into the airway.  Post stabilization, he was taken to the OR per Dr. Redmond Baseman and revision of emergent airway was placed - #8 proximal extended Shiley trach.  Post OR placement of trach, patient was weaned off sedation and transferred to SDU on 11/12.  Patient was able to remain off mechanical ventilation without distress. Would recommend he not be de-cannulated unless he has had significant weight reduction (>100lbs) due to the difficultly with airway during intubation.  Even then would not without coordinated effort of ENT & Pulmonary.  Prior to discharge due to deconditioning of critical illness, patient was evaluated by physical therapy.  Due to HD & Lurline Idol needs he was cleared for discharge to [facility] for rehab / HD needs. Lurline Idol was changed per Dr. Redmond Baseman prior to discharge.  Recommend ENT follow up for trach change.   CONSULTS ENT -  Dr. Redmond Baseman Nephrology - Dr. Jonnie Finner  SIGNIFICANT EVENTS / STUDIES:  11/11 Admited with SOB, cough, fever. Decompensated on SDU requiring emergent surgical airway.  11/11 OR >>> Revision of tracheostomy  11/12 Transferred to SDU   LINES / TUBES:  R Fem TLC 11/11>>>  R Rad A line 11/11>>> 11/12   OETT in [place] 11/11>>>11/11  Trach 11/11 (Dr. Bates)>>>   CULTURES:  11/11 Blood >>>neg  11/11 Urine >>>neg   ANTIBIOTICS:  Vanco 11/11 >>>11/14  Cefepime 11/11 >>1117  Discharge Exam: Gen: No distress, comfortable  HEENT: NCAT, tracheostomy site intact, no bleeding  PULM: Bilateral diminished air entry  CV: RRR, distant heart sounds  AB: Obese, non-tender, bowel sounds present  Ext: Warm, trace edema  Derm: Skin intact  Neuro: Non focal   Filed Vitals:   08/28/13 0442 08/28/13 0811 08/28/13 0853 08/28/13 1108  BP: 138/85 142/80 142/80   Pulse: 87 84 84 88  Temp: 98.6 F (37 C) 98.4 F (36.9 C)    TempSrc: Oral Oral    Resp: 18 23 30 26   Height:      Weight:      SpO2: 92% 97% 94% 100%   Discharge Labs  BMET  Recent Labs Lab 08/24/13 0645 08/25/13 0700 08/26/13 0903  NA 135 127* 128*  K 5.3* 4.8 4.5  CL 87* 81* 86*  CO2 21 19 21   GLUCOSE 189* 219* 233*  BUN 76* 103* 96*  CREATININE 13.20* 14.79* 13.15*  CALCIUM 10.7* 9.7 8.1*  PHOS  --  12.1* 9.1*   CBC  Recent Labs Lab 08/22/13 0500 08/26/13 0903  HGB 11.4* 9.4*  HCT 34.6* 27.3*  WBC 15.2* 11.9*  PLT 231 189      Medication List    STOP taking these medications       Insulin Pen Needle 31G X 8 MM Misc      TAKE these medications       amLODipine 10 MG tablet  Commonly known as:  NORVASC  Take 10 mg by mouth daily.     aspirin EC 81 MG tablet  Take 81 mg by mouth daily.     benzonatate 100 MG capsule  Commonly known as:  TESSALON  Take 1 capsule (100 mg total) by mouth 2 (two) times daily as needed for cough.     calcium acetate 667 MG capsule  Commonly known as:  PHOSLO  Take 2 capsules (1,334 mg total) by mouth 3 (three) times daily with meals.     darbepoetin 40 MCG/0.4ML Soln injection  Commonly known as:  ARANESP  Inject 0.4 mLs (40 mcg total) into the vein every Wednesday with hemodialysis.     feeding supplement (NEPRO CARB STEADY) Liqd  Take 237 mLs by mouth as  needed (missed meal during dialysis.).     fluticasone 50 MCG/ACT nasal spray  Commonly known as:  FLONASE  Place 2 sprays into both nostrils daily.     heparin 5000 UNIT/ML injection  Inject 1 mL (5,000 Units total) into the skin every 8 (eight) hours.     insulin aspart 100 UNIT/ML injection  Commonly known as:  novoLOG  Inject  0-15 Units into the skin 3 (three) times daily with meals.     insulin glargine 100 UNIT/ML injection  Commonly known as:  LANTUS  Inject 0.15 mLs (15 Units total) into the skin at bedtime.     pantoprazole 40 MG tablet  Commonly known as:  PROTONIX  Take 1 tablet (40 mg total) by mouth daily at 12 noon.     tamsulosin 0.4 MG Caps capsule  Commonly known as:  FLOMAX  Take 0.4 mg by mouth daily.     Vitamin D (Ergocalciferol) 50000 UNITS Caps capsule  Commonly known as:  DRISDOL  take 1 capsule by mouth every week       Follow-up Information   Follow up with Melida Quitter, MD On 10/07/2013. (Appt at 2:10 - arrive 15 minutes prior to appointment)    Specialty:  Otolaryngology   Contact information:   [address] [place]  [address] [phone]       Schedule an appointment as soon as possible for a visit with PARRETT,TAMMY, NP. (Post discharge from [facility] follow up)    Specialty:  Nurse Practitioner   Contact information:   [address] [address] [phone]         Disposition: Discharge to [facility]   Discharged Condition: Seth [name] has met maximum benefit of inpatient care and is medically stable and cleared for discharge.  Patient is pending follow up as above.      Time spent on disposition:  Greater than 35 minutes.   Signed: Noe Gens, NP-C [facility] Pgr: [phone] Office: [phone]

## 2013-08-26 NOTE — Progress Notes (Signed)
Admit: 08/19/2013 LOS: 7  17M ESRD, morbid obesity, admitted with hypercapneic RF, volume overload, required emergent surgical airway (11/11).    Subjective:  On dialysis, dry cough, no sob , wt 149kg yest   11/16 0701 - 11/17 0700 In: 770 [P.O.:720; IV Piggyback:50] Out: -   Filed Weights   08/23/13 0500 08/24/13 0408 08/25/13 0408  Weight: 161.4 kg (355 lb 13.2 oz) 149.6 kg (329 lb 12.9 oz) 149.1 kg (328 lb 11.3 oz)    Current meds: reviewed including cefepime Current Labs: reviewed  Outpt HD Orders  Unit: [place] MWF Time: 5h 21min  Dialyzer: F200  EDW: 148.5kg  K/Ca: 2k/2.25Ca  Access: AVF RFA  BFR: 550  UF Proflie: none  VDRA: Hectorol 10 qTx  EPO: 3000 qTx  IV Fe: Venofer 50 qWk   Physical Exam:  Blood pressure 116/91, pulse 76, temperature 97.8 F (36.6 C), temperature source Oral, resp. rate 18, height 5\' 8"  (1.727 m), weight 149.1 kg (328 lb 11.3 oz), SpO2 99.00%. GEN: Morbidly obese, awake and interactive, cheerful ENT: Trach in place, on trach collar and with PM valve on CV: RRR, nl s1s2 LUNGS: coarse bs b/l ABD: obese, soft NEURO: interactive, mae, nf EXT: RFA AVF +b/t EYES: EOMI, muddy sclera  Assessment/Plan 1. ESRD on HD MWF: HD today, challenge EDW by 1kg.   2. Anemia of CKD: Cont ESA as aranesp 40 qwk, next 11/19 3. HTN/Vol: as above 4. HypercapneicRF s/p tracheostomy and URI/PNA: per CCM and ENT. 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  Ca 8.1, Phos 9.1.  Restarted PhosLo.  Have been holding hectorol to this point      Kelly Splinter MD  pager [phone]    cell [phone]  08/26/2013, 9:34 AM    Recent Labs Lab 08/21/13 0500 08/24/13 0645 08/25/13 0700 08/26/13 0903  NA 137 135 127* 128*  K 4.3 5.3* 4.8 4.5  CL 93* 87* 81* 86*  CO2 25 21 19 21   GLUCOSE 157* 189* 219* 233*  BUN 60* 76* 103* PENDING  CREATININE 12.13* 13.20* 14.79* 13.15*  CALCIUM 8.1* 10.7* 9.7 8.1*  PHOS 6.1*  --  12.1* 9.1*    Recent Labs Lab 08/20/13 0355   08/21/13 0500 08/22/13 0500 08/26/13 0903  WBC 13.3*  < > 13.4* 15.2* 11.9*  NEUTROABS 9.8*  --   --  11.4*  --   HGB 11.3*  < > 9.5* 11.4* 9.4*  HCT 34.7*  < > 29.1* 34.6* 27.3*  MCV 97.5  < > 94.2 96.4 90.7  PLT 230  < > 200 231 189  < > = values in this interval not displayed.  Current Facility-Administered Medications  Medication Dose Route Frequency Provider Last Rate Last Dose  . 0.9 %  sodium chloride infusion  100 mL Intravenous PRN Rexene Agent, MD      . 0.9 %  sodium chloride infusion  100 mL Intravenous PRN Rexene Agent, MD      . acetaminophen (TYLENOL) solution 650 mg  650 mg Oral Q6H PRN Raylene Miyamoto, MD      . antiseptic oral rinse (BIOTENE) solution 15 mL  15 mL Mouth Rinse QID Juanito Doom, MD   15 mL at 08/26/13 0400  . benzonatate (TESSALON) capsule 100 mg  100 mg Oral BID PRN Colbert Coyer, MD      . calcium acetate (PHOSLO) capsule 1,334 mg  1,334 mg Oral TID WC Rexene Agent, MD   1,334 mg at  08/25/13 1658  . ceFEPIme (MAXIPIME) 1 g in dextrose 5 % 50 mL IVPB  1 g Intravenous Q24H Raylene Miyamoto, MD   1 g at 08/25/13 2104  . chlorhexidine (PERIDEX) 0.12 % solution 15 mL  15 mL Mouth Rinse BID Juanito Doom, MD   15 mL at 08/25/13 2104  . darbepoetin (ARANESP) injection 40 mcg  40 mcg Intravenous Q Wed-HD Rexene Agent, MD   40 mcg at 08/21/13 1631  . heparin injection 1,000 Units  1,000 Units Dialysis PRN Rexene Agent, MD      . heparin injection 15,500 Units  100 Units/kg Dialysis PRN Rexene Agent, MD   3,100 Units at 08/21/13 1351  . heparin injection 5,000 Units  5,000 Units Subcutaneous Q8H Raylene Miyamoto, MD   5,000 Units at 08/26/13 [phone]  . insulin aspart (novoLOG) injection 0-9 Units  0-9 Units Subcutaneous TID WC [name], MD   7 Units at 08/25/13 1646  . lidocaine (PF) (XYLOCAINE) 1 % injection 5 mL  5 mL Intradermal PRN Rexene Agent, MD      . lidocaine-prilocaine (EMLA) cream 1 application  1  application Topical PRN Rexene Agent, MD      . ondansetron [facility]) tablet 4 mg  4 mg Oral Q6H PRN [name], MD       Or  . ondansetron [facility]) injection 4 mg  4 mg Intravenous Q6H PRN [name], MD      . pantoprazole (PROTONIX) EC tablet 40 mg  40 mg Oral Q1200 Raylene Miyamoto, MD   40 mg at 08/25/13 1658  . pentafluoroprop-tetrafluoroeth (GEBAUERS) aerosol 1 application  1 application Topical PRN Rexene Agent, MD

## 2013-08-26 NOTE — Procedures (Signed)
I was present at this dialysis session, have reviewed the session itself and made  appropriate changes   Kelly Splinter MD  pager [phone]    cell [phone]  08/26/2013, 9:36 AM

## 2013-08-26 NOTE — Progress Notes (Signed)
Sutures in place, Trach secured no breakdown noted at trach site.

## 2013-08-26 NOTE — Progress Notes (Signed)
Speech Language Pathology    :  Cancel Note for 11/17 Patient Details Name: Seth [name] MRN: [id number] DOB: September 24, 1966 Today's Date: 08/26/2013 Time:  -    Attempted to see for donn/doff training for PMSV and dysphagia.  Pt. Currently in HD.  Will check next date  GO     08/26/2013, 1:58 PM

## 2013-08-26 NOTE — Progress Notes (Signed)
Inpatient Diabetes Program Recommendations  AACE/ADA: New Consensus Statement on Inpatient Glycemic Control (2013)  Target Ranges:  Prepandial:   less than 140 mg/dL      Peak postprandial:   less than 180 mg/dL (1-2 hours)      Critically ill patients:  140 - 180 mg/dL  Results for JOEANGEL, PROROK (MRN [id number]) as of 08/26/2013 12:45  Ref. Range 08/24/2013 19:57 08/25/2013 08:28 08/25/2013 12:09 08/25/2013 16:45 08/25/2013 21:54  Glucose-Capillary Latest Range: 70-99 mg/dL 269 (H) 242 (H) 283 (H) 345 (H) 299 (H)   Inpatient Diabetes Program Recommendations Insulin - Basal: consider low dose basal Levemir 10 units  Thank you  Raoul Pitch BSN, RN,CDE Inpatient Diabetes Coordinator [phone] (team pager)

## 2013-08-26 NOTE — Progress Notes (Signed)
Patient has not required ventilator support > 48 hrs. Tolerating 28% ATC well with no distress.

## 2013-08-27 ENCOUNTER — Inpatient Hospital Stay (HOSPITAL_COMMUNITY): Payer: Medicare Other

## 2013-08-27 ENCOUNTER — Encounter (HOSPITAL_COMMUNITY): Payer: Self-pay | Admitting: Radiology

## 2013-08-27 DIAGNOSIS — I120 Hypertensive chronic kidney disease with stage 5 chronic kidney disease or end stage renal disease: Secondary | ICD-10-CM | POA: Diagnosis not present

## 2013-08-27 DIAGNOSIS — J9600 Acute respiratory failure, unspecified whether with hypoxia or hypercapnia: Secondary | ICD-10-CM | POA: Diagnosis not present

## 2013-08-27 DIAGNOSIS — D631 Anemia in chronic kidney disease: Secondary | ICD-10-CM | POA: Diagnosis not present

## 2013-08-27 DIAGNOSIS — N049 Nephrotic syndrome with unspecified morphologic changes: Secondary | ICD-10-CM | POA: Diagnosis not present

## 2013-08-27 DIAGNOSIS — N186 End stage renal disease: Secondary | ICD-10-CM | POA: Diagnosis not present

## 2013-08-27 DIAGNOSIS — T82898A Other specified complication of vascular prosthetic devices, implants and grafts, initial encounter: Secondary | ICD-10-CM | POA: Diagnosis not present

## 2013-08-27 LAB — GLUCOSE, CAPILLARY
Glucose-Capillary: 295 mg/dL — ABNORMAL HIGH (ref 70–99)
Glucose-Capillary: 162 mg/dL — ABNORMAL HIGH (ref 70–99)

## 2013-08-27 MED ORDER — SODIUM CHLORIDE 0.9 % IV SOLN: 100.0000 mL | INTRAVENOUS | Status: DC | PRN

## 2013-08-27 MED ORDER — HEPARIN SODIUM (PORCINE) 1000 UNIT/ML IJ SOLN
INTRAMUSCULAR | Status: AC | PRN
  Administered 2013-08-27: 3000 [IU] via INTRAVENOUS

## 2013-08-27 MED ORDER — ALTEPLASE 100 MG IV SOLR
2.0000 mg | Freq: Once | INTRAVENOUS | Status: AC
  Administered 2013-08-27: 2 mg
  Filled 2013-08-27: qty 2

## 2013-08-27 MED ORDER — LIDOCAINE HCL (PF) 1 % IJ SOLN: 5.0000 mL | INTRAMUSCULAR | Status: DC | PRN

## 2013-08-27 MED ORDER — ALTEPLASE 2 MG IJ SOLR: 2.0000 mg | Freq: Once | INTRAMUSCULAR | Status: AC | PRN

## 2013-08-27 MED ORDER — HEPARIN SODIUM (PORCINE) 1000 UNIT/ML DIALYSIS
3000.0000 [IU] | INTRAMUSCULAR | Status: DC | PRN
  Administered 2013-08-28: 3000 [IU] via INTRAVENOUS_CENTRAL

## 2013-08-27 MED ORDER — HEPARIN SODIUM (PORCINE) 1000 UNIT/ML IJ SOLN
INTRAMUSCULAR | Status: AC
  Filled 2013-08-27: qty 1

## 2013-08-27 MED ORDER — LIDOCAINE-PRILOCAINE 2.5-2.5 % EX CREA: 1.0000 "application " | TOPICAL_CREAM | CUTANEOUS | Status: DC | PRN

## 2013-08-27 MED ORDER — HEPARIN SODIUM (PORCINE) 1000 UNIT/ML DIALYSIS
1000.0000 [IU] | INTRAMUSCULAR | Status: DC | PRN
  Filled 2013-08-27: qty 1

## 2013-08-27 MED ORDER — MIDAZOLAM HCL 2 MG/2ML IJ SOLN
INTRAMUSCULAR | Status: AC
  Filled 2013-08-27: qty 2

## 2013-08-27 MED ORDER — HEPARIN SODIUM (PORCINE) 1000 UNIT/ML DIALYSIS: 100.0000 [IU]/kg | INTRAMUSCULAR | Status: DC | PRN

## 2013-08-27 MED ORDER — FENTANYL CITRATE 0.05 MG/ML IJ SOLN
INTRAMUSCULAR | Status: AC
  Filled 2013-08-27: qty 2

## 2013-08-27 MED ORDER — DOXERCALCIFEROL 4 MCG/2ML IV SOLN
10.0000 ug | INTRAVENOUS | Status: DC
  Filled 2013-08-27: qty 6

## 2013-08-27 MED ORDER — NEPRO/CARBSTEADY PO LIQD: 237.0000 mL | ORAL | Status: DC | PRN

## 2013-08-27 MED ORDER — PENTAFLUOROPROP-TETRAFLUOROETH EX AERO: 1.0000 "application " | INHALATION_SPRAY | CUTANEOUS | Status: DC | PRN

## 2013-08-27 MED ORDER — IOHEXOL 300 MG/ML  SOLN
100.0000 mL | Freq: Once | INTRAMUSCULAR | Status: AC | PRN
  Administered 2013-08-27: 85 mL via INTRAVENOUS

## 2013-08-27 MED ORDER — CHLORHEXIDINE GLUCONATE 4 % EX LIQD
CUTANEOUS | Status: AC
  Filled 2013-08-27: qty 15

## 2013-08-27 NOTE — Progress Notes (Signed)
Spoke with pt's primary nephrologist at [facility] and he is not a candidate to return to outpatient HD at this time.  Plan is to reassess in a week or so to see how he is handling secretions, what his suctioning requirements are, can he wear a Passy-Muir valve for 6hrs at a time, etc.  Therefore he cannot be d/c'd to home or SNF at this time, only other possibility would be LTAC where they do dialysis in the same facility.     Kelly Splinter MD  pager [phone]    cell [phone]  08/27/2013, 11:44 AM

## 2013-08-27 NOTE — Progress Notes (Signed)
PULMONARY  / CRITICAL CARE MEDICINE  Name: Seth [name] MRN: [id number] DOB: [birth year]    ADMISSION DATE:  08/19/2013 CONSULTATION DATE:  08/19/2013  REFERRING MD :  EDP PRIMARY SERVICE: PCCM  CHIEF COMPLAINT:  Cough, congestion  BRIEF PATIENT DESCRIPTION: 47 yo with OSA, ESRD presented to the [facility] ED on 11/10 with fever, cough, and chest congestion.  In the ED he developed hypercapnic respiratory failure requiring BIPAP briefly.  PCCM asked to evaluate for ICU.  SIGNIFICANT EVENTS / STUDIES:  11/11 -  Admited with SOB, cough, fever. Decompensated on SDU requiring emergent surgical airway. 11/11 -  OR >>> Revision of tracheostomy 11/12 -  Transferred to SDU 11/18 -  Trach changed per Dr. Redmond Baseman, cuffed trach placed  LINES / TUBES: R Fem TLC 11/11>>>  R Rad A line 11/11>>> 11/12 OETT in [place] 11/11>>>11/11 Trach 11/11 (ENT)>>>  CULTURES: 11/11  Blood >>>neg 11/11  Urine >>>neg  ANTIBIOTICS: Cefepime 11/11 >>>11/18  Vanco 11/11 >>>11/14   SUBJECTIVE: No active issues overnight. "Ready to go home"  VITAL SIGNS: Temp:  [97.5 F (36.4 C)-98.6 F (37 C)] 97.8 F (36.6 C) (11/18 1208) Pulse Rate:  [76-110] 76 (11/18 1300) Resp:  [13-28] 20 (11/18 1300) BP: (117-140)/(32-88) 132/74 mmHg (11/18 1208) SpO2:  [96 %-100 %] 96 % (11/18 1300) FiO2 (%):  [28 %] 28 % (11/18 0754) On RA  INTAKE / OUTPUT: Intake/Output     11/17 0701 - 11/18 0700 11/18 0701 - 11/19 0700   P.O. 480    IV Piggyback 50    Total Intake(mL/kg) 530 (3.6)    Other 1900    Total Output 1900     Net -1370          Urine Occurrence 1 x    Stool Occurrence 2 x     PHYSICAL EXAMINATION:  Gen: No distress, comfortable HEENT: NCAT, tracheostomy site intact, no bleeding, tol PMV PULM: Bilateral diminished air entry CV: RRR, distant heart sounds AB: Obese, non-tender, bowel sounds present Ext: Warm, trace edema Derm: Skin intact  Neuro: Non focal  LABS:  CBC  Recent Labs Lab  08/21/13 0500 08/22/13 0500 08/26/13 0903  WBC 13.4* 15.2* 11.9*  HGB 9.5* 11.4* 9.4*  HCT 29.1* 34.6* 27.3*  PLT 200 231 189   BMET  Recent Labs Lab 08/24/13 0645 08/25/13 0700 08/26/13 0903  NA 135 127* 128*  K 5.3* 4.8 4.5  CL 87* 81* 86*  CO2 21 19 21   BUN 76* 103* 96*  CREATININE 13.20* 14.79* 13.15*  GLUCOSE 189* 219* 233*   Electrolytes  Recent Labs Lab 08/21/13 0500 08/24/13 0645 08/25/13 0700 08/26/13 0903  CALCIUM 8.1* 10.7* 9.7 8.1*  MG 1.9  --   --   --   PHOS 6.1*  --  12.1* 9.1*   ABG  Recent Labs Lab 08/20/13 2224 08/21/13 1421  PHART 7.387 7.349*  PCO2ART 47.3* 55.9*  PO2ART 86.0 97.0   Liver Enzymes  Recent Labs Lab 08/26/13 0903  ALBUMIN 2.5*   Glucose  Recent Labs Lab 08/26/13 1705 08/26/13 2056 08/26/13 2351 08/27/13 0518 08/27/13 0758 08/27/13 1207  GLUCAP 474* 371* 250* 163* 227* 295*   CXR 11/16 > resolved atx, no airspace disease  ASSESSMENT / PLAN:  PULMONARY A:  Acute on chronic hypercapnic respiratory failure.  Difficult Airway, sp emergent bedside trach, revised by ENT.  Obesity hypoventilation syndrome.  Acute tracheobronchitis.   Pulmonary edema in setting of ESRD / fluid overload - resolved. P:   -  Not a candidate for de-cannulation unless looses a significant amount of weight. - Goal SpO2>92. - PMV. Lurline Idol care teaching underway - plan for d/c home once HD center arranged - F/U with Dr. Redmond Baseman for trach change  CARDIOVASCULAR A:  Suspected acute on chronic diastolic heart failure. P:  - Hold norvasc. - TTE EF 55-60%.   RENAL A:   ESRD on HD. P:   - Renal following, pending arrangement for outpt HD with trach  INFECTIOUS A:   Acute tracheobronchitis P:   -d/c abx -follow clinically  GI A:  Morbid obesity.  Suspected dysphagia. P: - Advance diet as tolerated  HEMATOLOGIC A:   Anemia, chronic, stable. P:  - Trend CBC. - Heparin for DVT Px. - Aranesp.  ENDOCRINE A:   Diabetes mellitus. P: - SSI.  NEUROLOGIC A:   Acute encephalopathy - resolved.  P:   - Supportive care.  D/C pending placement of outpt HD center that will take trach.   Noe Gens, NP-C [facility] Pgr: [phone] or [phone]  Patient seen and examined, agree with above note.  I dictated the care and orders written for this patient under my direction.  Rush Farmer, MD [phone]

## 2013-08-27 NOTE — Procedures (Signed)
Successful RT FA AVF lysis, 96mm pta, and thrombectomy to restore flow No comp Stable Full report in pacs

## 2013-08-27 NOTE — Progress Notes (Signed)
  [facility] Progress Note    Subjective: No complaints, up in chair. Had probs with AVF late in HD yest, pulling clots, wouldn't run , HD cut short by 1 hour   Exam  Blood pressure 128/78, pulse 83, temperature 97.5 F (36.4 C), temperature source Oral, resp. rate 22, height 5\' 8"  (1.727 m), weight 149.1 kg (328 lb 11.3 oz), SpO2 97.00%. GEN: Morbidly obese, awake and interactive, cheerful  ENT: trach in place, on trach collar and with PM valve on  CV: RRR, nl s1s2  LUNGS: coarse bs b/l  ABD: obese, soft  NEURO: interactive, mae, nf  EXT: no bruit over R forearm AVF  EYES: EOMI, muddy sclera  [facility]patient HD:  [facility] 5hr 35min   148.5kg   F200   2K/2.25Ca   RFA AVF   No profile  Heparin 8000 and 4000 midRx Hect 10ug   EPO 3000   Venofer 50/wk  Assessment/Plan 1. Clotted AVF: consulting IR for this 2. ESRD: HD tomorrow, will give 50% usual hep dose, need to arrange outpt HD where they will take trach pts, we are working on this. Cannot be discharged at this time 3. Anemia of CKD: cont ESA w aranesp 40 qwk, next 11/19  4. HTN/Vol: at dry wt  5. Hypercapneic RF / PNA: per CCM and ENT.  6. 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 Ca 8.1, Phos 9.1. Restarted PhosLo. Have been holding hectorol to this point d/t high phos    Kelly Splinter MD  pager [phone]    cell [phone]  08/27/2013, 10:42 AM   Recent Labs Lab 08/21/13 0500 08/24/13 0645 08/25/13 0700 08/26/13 0903  NA 137 135 127* 128*  K 4.3 5.3* 4.8 4.5  CL 93* 87* 81* 86*  CO2 25 21 19 21   GLUCOSE 157* 189* 219* 233*  BUN 60* 76* 103* 96*  CREATININE 12.13* 13.20* 14.79* 13.15*  CALCIUM 8.1* 10.7* 9.7 8.1*  PHOS 6.1*  --  12.1* 9.1*    Recent Labs Lab 08/20/13 1131 08/26/13 0903  AST 20  --   ALT 18  --   ALKPHOS 54  --   BILITOT 0.4  --   PROT 8.6*  --   ALBUMIN 3.6 2.5*    Recent Labs Lab 08/21/13 0500 08/22/13 0500 08/26/13 0903  WBC 13.4* 15.2* 11.9*  NEUTROABS  --  11.4*  --   HGB  9.5* 11.4* 9.4*  HCT 29.1* 34.6* 27.3*  MCV 94.2 96.4 90.7  PLT 200 231 189   . antiseptic oral rinse  15 mL Mouth Rinse QID  . calcium acetate  1,334 mg Oral TID WC  . ceFEPime (MAXIPIME) IV  1 g Intravenous Q24H  . chlorhexidine  15 mL Mouth Rinse BID  . darbepoetin (ARANESP) injection - DIALYSIS  40 mcg Intravenous Q Wed-HD  . heparin subcutaneous  5,000 Units Subcutaneous Q8H  . insulin aspart  0-15 Units Subcutaneous Q4H  . insulin glargine  15 Units Subcutaneous QHS  . pantoprazole  40 mg Oral Q1200     sodium chloride, sodium chloride, acetaminophen (TYLENOL) oral liquid 160 mg/5 mL, benzonatate, heparin, heparin, lidocaine (PF), lidocaine-prilocaine, ondansetron (ZOFRAN) IV, ondansetron, pentafluoroprop-tetrafluoroeth

## 2013-08-27 NOTE — Progress Notes (Signed)
At 1250, patient was discovered to be sleeping, sitting up in the chair, without the Passy Muir Valve on his trach.  The patient's O2 saturations dropped to 83% while the nurse was in the room.  When woken up, the patient's O2 began to level off.  He was instructed to take some deep breaths in and his O2 sats returned to 98% without requiring supplemental O2. He has been alert and oriented since that time and continues to maintain saturation levels in the upper 90s.  He is stable at this time.  Will continue to monitor. Emilia Beck, RN

## 2013-08-27 NOTE — Progress Notes (Signed)
PT Cancellation Note  Patient Details Name: Seth [name] MRN: [id number] DOB: [birth year]   Cancelled Treatment:    Reason Eval/Treat Not Completed: Other (comment) (refused secondary to going to procedure and pt upset ).  Nursing agreed to just let pt make the decision to get up or not as pt was going home but cannot due to dialysis location issues.  Will check back tomorrow and see as able.  Thanks.   INGOLD,Ilithyia Titzer 08/27/2013, 1:39 PM [facility] [phone] (pager)

## 2013-08-27 NOTE — Progress Notes (Signed)
Subjective: Doing well off ventilator.  Discharge home coming soon.  Breathing comfortably.  Tolerating P-M valve.  Tolerating oral diet.  Objective: Vital signs in last 24 hours: Temp:  [97.5 F (36.4 C)-98.6 F (37 C)] 97.5 F (36.4 C) (11/18 0754) Pulse Rate:  [79-110] 79 (11/18 0754) Resp:  [13-29] 22 (11/18 0754) BP: (98-140)/(32-88) 128/78 mmHg (11/18 0600) SpO2:  [94 %-100 %] 96 % (11/18 0754) FiO2 (%):  [28 %] 28 % (11/18 0754) Weight change:  Last BM Date: 08/25/13  Intake/Output from previous day: 11/17 0701 - 11/18 0700 In: 530 [P.O.:480; IV Piggyback:50] Out: 1900  Intake/Output this shift:    General appearance: alert, cooperative and no distress Neck: #8 proximal extended length cuffed Shiley tube in place.  Some wound breakdown inferiorly.  Lab Results:  Recent Labs  08/26/13 0903  WBC 11.9*  HGB 9.4*  HCT 27.3*  PLT 189   BMET  Recent Labs  08/25/13 0700 08/26/13 0903  NA 127* 128*  K 4.8 4.5  CL 81* 86*  CO2 19 21  GLUCOSE 219* 233*  BUN 103* 96*  CREATININE 14.79* 13.15*  CALCIUM 9.7 8.1*    Studies/Results: Dg Chest Port 1 View  08/25/2013   CLINICAL DATA:  Acute on chronic respiratory failure.  EXAM: PORTABLE CHEST - 1 VIEW  COMPARISON:  08/21/2013 and 08/20/2013  FINDINGS: Tracheostomy tube is unchanged.  NG tube has been removed.  Heart size is within normal limits. There is slight prominence of the pulmonary vascularity. No pulmonary edema. Atelectasis has resolved bilaterally. No effusions.  IMPRESSION: Slight pulmonary vascular prominence. Resolution of atelectasis. No pulmonary edema.   Electronically Signed   By: Rozetta Nunnery M.D.   On: 08/25/2013 15:57    Medications: I have reviewed the patient's current medications.  Assessment/Plan: S/p tracheostomy I changed him to a fresh #8 proximal extended length cuffed Shiley tube without difficulty.  Sutures all removed.  Discharge pending.  Continue trach dressing, wound should  improve.  Trach care instruction.  Follow-up with me next week.   LOS: 8 days   Seth [name] 08/27/2013, 9:01 AM

## 2013-08-27 NOTE — ED Notes (Signed)
Pt comfortable does not want medication for procedure

## 2013-08-27 NOTE — Progress Notes (Signed)
Speech Language Pathology Treatment: Nada Boozer Speaking valve  Patient Details Name: Seth [name] MRN: [id number] DOB: July 10, 1966 Today's Date: 08/27/2013 Time: [id number] SLP Time Calculation (min): 20 min  Assessment / Plan / Recommendation Clinical Impression  Pt. Seen for skilled treatment for education regarding donn and doff of PMSV.  Using a table top mirror, SLP demonstration and mild visual and verbal cues, pt. placed and removed valve without difficulty (leash attached to valve) and demonstrated multiple times.  Vocal quality normal and functional intensity. ST will continue to see pt. For education of PMSV.   HPI HPI: Seth [name] is a 47 y.o. male was brought to the ER after patient was found to have increasing shortness of breath. Patient has been having dialysis regularly and states he has not missed his dialysis. He has been having some nonproductive cough for last 2-3 days with some fever and chills. He also was recently placed on CPAP for his obstructive sleep apnea which he has been regularly using for last 2 weeks. He has noticed some voice changes with his cough. In the ER patient was found to be lethargic and ABG showed hypercapnia and patient was placed on BiPAP. After a brief time patient's mental status improved largely. Critical care was consulted and at this time patient has been diabetic for further management. Patient denies any chest pain nausea vomiting abdominal pain. In the ER patient was found to be febrile and was placed on empiric antibiotics after blood cultures were taken. Chest x-ray shows features consistent with chest congestion.   Pertinent Vitals WDL  SLP Plan  Continue with current plan of care    Recommendations        Patient may use Passy-Muir Speech Valve: During all waking hours (remove during sleep) PMSV Supervision: Intermittent MD: Please consider changing trach tube to : Smaller size       Oral Care Recommendations: Oral care  BID Follow up Recommendations: 24 hour supervision/assistance Plan: Continue with current plan of care    GO    Orbie Pyo Shriya Aker M.Ed [employer] [phone]  08/27/2013

## 2013-08-27 NOTE — H&P (Signed)
Seth [name] is an 47 y.o. male.   Chief Complaint: ESRD; Rt arm fistula running slow and pulling clots per dialysis session yesterday. Pt states last evaluation and intervention was done at outside facility weeks ago. Scheduled now for Rt arm dialysis fistula thrombolysis and possible angioplasty/stent placement. Possible dialysis catheter placement if needed.  HPI: ESRD; DM; HTN; Trach  Past Medical History  Diagnosis Date  . Diabetes mellitus   . Hypertension   . Chronic kidney disease   . 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 Av fistula placement  05/03/2012    Procedure: ARTERIOVENOUS (AV) FISTULA CREATION;  Surgeon: Mal [name], MD;  Location: [place];  Service: Vascular;  Laterality: Right;  . Tracheostomy tube placement N/A 08/20/2013    Procedure: TRACHEOSTOMY Revision;  Surgeon: Melida Quitter, MD;  Location: [facility] OR;  Service: ENT;  Laterality: N/A;    Family History  Problem Relation Age of Onset  . Diabetes Mother   . Diabetes Sister   . Diabetes Brother    Social History:  reports that he has never smoked. He has never used smokeless tobacco. He reports that he drinks about 0.6 ounces of alcohol per week. He reports that he uses illicit drugs (Marijuana).  Allergies: No Known Allergies  Medications Prior to Admission  Medication Sig Dispense Refill  . amLODipine (NORVASC) 10 MG tablet Take 10 mg by mouth daily.      Marland Kitchen aspirin EC 81 MG tablet Take 81 mg by mouth daily.      . insulin aspart (NOVOLOG) 100 UNIT/ML injection Inject 0-15 Units into the skin 3 (three) times daily with meals.      . Tamsulosin HCl (FLOMAX) 0.4 MG CAPS Take 0.4 mg by mouth daily.      . Vitamin D, Ergocalciferol, (DRISDOL) 50000 UNITS CAPS take 1 capsule by mouth every week  5 capsule  0  . Insulin Pen Needle 31G X 8 MM MISC Use to inject sliding scale insulin three times daily before  meals.  100 each  0    Results for orders placed during the hospital encounter of 08/19/13 (from the past 48 hour(s))  GLUCOSE, CAPILLARY     Status: Abnormal   Collection Time    08/25/13 12:09 PM      Result Value Range   Glucose-Capillary 283 (*) 70 - 99 mg/dL  GLUCOSE, CAPILLARY     Status: Abnormal   Collection Time    08/25/13  4:45 PM      Result Value Range   Glucose-Capillary 345 (*) 70 - 99 mg/dL  GLUCOSE, CAPILLARY     Status: Abnormal   Collection Time    08/25/13  9:54 PM      Result Value Range   Glucose-Capillary 299 (*) 70 - 99 mg/dL  RENAL FUNCTION PANEL     Status: Abnormal   Collection Time    08/26/13  9:03 AM      Result Value Range   Sodium 128 (*) 135 - 145 mEq/L   Potassium 4.5  3.5 - 5.1 mEq/L   Chloride 86 (*) 96 - 112 mEq/L   CO2 21  19 - 32 mEq/L   Glucose, Bld 233 (*) 70 - 99 mg/dL   BUN 96 (*) 6 - 23 mg/dL   Creatinine, Ser 13.15 (*) 0.50 - 1.35 mg/dL   Calcium  8.1 (*) 8.4 - 10.5 mg/dL   Phosphorus 9.1 (*) 2.3 - 4.6 mg/dL   Albumin 2.5 (*) 3.5 - 5.2 g/dL   GFR calc non Af Amer 4 (*) >90 mL/min   GFR calc Af Amer 4 (*) >90 mL/min   Comment: (NOTE)     The eGFR has been calculated using the CKD EPI equation.     This calculation has not been validated in all clinical situations.     eGFR's persistently <90 mL/min signify possible Chronic Kidney     Disease.  CBC     Status: Abnormal   Collection Time    08/26/13  9:03 AM      Result Value Range   WBC 11.9 (*) 4.0 - 10.5 K/uL   RBC 3.01 (*) 4.22 - 5.81 MIL/uL   Hemoglobin 9.4 (*) 13.0 - 17.0 g/dL   HCT 27.3 (*) 39.0 - 52.0 %   MCV 90.7  78.0 - 100.0 fL   MCH 31.2  26.0 - 34.0 pg   MCHC 34.4  30.0 - 36.0 g/dL   RDW 13.1  11.5 - 15.5 %   Platelets 189  150 - 400 K/uL  GLUCOSE, CAPILLARY     Status: Abnormal   Collection Time    08/26/13  5:05 PM      Result Value Range   Glucose-Capillary 474 (*) 70 - 99 mg/dL   Comment 1 Notify RN    GLUCOSE, CAPILLARY     Status: Abnormal    Collection Time    08/26/13  8:56 PM      Result Value Range   Glucose-Capillary 371 (*) 70 - 99 mg/dL   Comment 1 Notify RN    GLUCOSE, CAPILLARY     Status: Abnormal   Collection Time    08/26/13 11:51 PM      Result Value Range   Glucose-Capillary 250 (*) 70 - 99 mg/dL   Comment 1 Notify RN    GLUCOSE, CAPILLARY     Status: Abnormal   Collection Time    08/27/13  5:18 AM      Result Value Range   Glucose-Capillary 163 (*) 70 - 99 mg/dL  GLUCOSE, CAPILLARY     Status: Abnormal   Collection Time    08/27/13  7:58 AM      Result Value Range   Glucose-Capillary 227 (*) 70 - 99 mg/dL   Dg Chest Port 1 View  08/25/2013   CLINICAL DATA:  Acute on chronic respiratory failure.  EXAM: PORTABLE CHEST - 1 VIEW  COMPARISON:  08/21/2013 and 08/20/2013  FINDINGS: Tracheostomy tube is unchanged.  NG tube has been removed.  Heart size is within normal limits. There is slight prominence of the pulmonary vascularity. No pulmonary edema. Atelectasis has resolved bilaterally. No effusions.  IMPRESSION: Slight pulmonary vascular prominence. Resolution of atelectasis. No pulmonary edema.   Electronically Signed   By: Rozetta Nunnery M.D.   On: 08/25/2013 15:57    Review of Systems  Constitutional: Negative for fever and weight loss.  Respiratory: Positive for sputum production. Negative for cough.   Cardiovascular: Negative for chest pain.  Gastrointestinal: Negative for nausea and vomiting.  Neurological: Negative for headaches.    Blood pressure 128/78, pulse 83, temperature 97.5 F (36.4 C), temperature source Oral, resp. rate 22, height 5\' 8"  (1.727 m), weight 328 lb 11.3 oz (149.1 kg), SpO2 97.00%. Physical Exam  Constitutional: He is oriented to person, place, and time.  Obese; trach  Neck:  trach  Cardiovascular: Normal rate and regular rhythm.   Respiratory: Effort normal and breath sounds normal. He has no wheezes.  GI: Soft. There is no tenderness.  Musculoskeletal: Normal range of  motion.  Rt arm fistula +pulse  Neurological: He is alert and oriented to person, place, and time.  Skin: Skin is warm.  Psychiatric: He has a normal mood and affect. His behavior is normal. Judgment and thought content normal.     Assessment/Plan Rt arm fistula pulling clots in dialysis yesterday Request for fistulogram with poss declot/ pta/stent. Poss dialysis catheter if needed Pt aware of procedure benefits and risks and agreeable to proceed Consent signed and in chart  Adonis Ryther A 08/27/2013, 11:45 AM

## 2013-08-27 NOTE — Progress Notes (Signed)
AV fistula now declotted. Was NPO prior to procedure. RN Says awake enough tio diet  Plan Resume renal diet   Dr. Brand Males, M.D., [facility].C.P Pulmonary and Critical Care Medicine Staff Physician [place] [facility]monary [facility] Critical [facility] Pager: [phone], If no answer or between  15:00h - 7:00h: call [phone]  08/27/2013 5:48 PM

## 2013-08-28 DIAGNOSIS — G4733 Obstructive sleep apnea (adult) (pediatric): Secondary | ICD-10-CM | POA: Diagnosis not present

## 2013-08-28 DIAGNOSIS — N179 Acute kidney failure, unspecified: Secondary | ICD-10-CM | POA: Diagnosis not present

## 2013-08-28 DIAGNOSIS — M899 Disorder of bone, unspecified: Secondary | ICD-10-CM | POA: Diagnosis not present

## 2013-08-28 DIAGNOSIS — J40 Bronchitis, not specified as acute or chronic: Secondary | ICD-10-CM | POA: Diagnosis not present

## 2013-08-28 DIAGNOSIS — D649 Anemia, unspecified: Secondary | ICD-10-CM | POA: Diagnosis not present

## 2013-08-28 DIAGNOSIS — M1990 Unspecified osteoarthritis, unspecified site: Secondary | ICD-10-CM | POA: Diagnosis not present

## 2013-08-28 DIAGNOSIS — N049 Nephrotic syndrome with unspecified morphologic changes: Secondary | ICD-10-CM | POA: Diagnosis not present

## 2013-08-28 DIAGNOSIS — Z6841 Body Mass Index (BMI) 40.0 and over, adult: Secondary | ICD-10-CM | POA: Diagnosis not present

## 2013-08-28 DIAGNOSIS — E119 Type 2 diabetes mellitus without complications: Secondary | ICD-10-CM | POA: Diagnosis not present

## 2013-08-28 DIAGNOSIS — E875 Hyperkalemia: Secondary | ICD-10-CM | POA: Diagnosis not present

## 2013-08-28 DIAGNOSIS — D631 Anemia in chronic kidney disease: Secondary | ICD-10-CM | POA: Diagnosis not present

## 2013-08-28 DIAGNOSIS — J9509 Other tracheostomy complication: Secondary | ICD-10-CM | POA: Diagnosis not present

## 2013-08-28 DIAGNOSIS — I10 Essential (primary) hypertension: Secondary | ICD-10-CM | POA: Diagnosis not present

## 2013-08-28 DIAGNOSIS — I5033 Acute on chronic diastolic (congestive) heart failure: Secondary | ICD-10-CM | POA: Diagnosis not present

## 2013-08-28 DIAGNOSIS — F4310 Post-traumatic stress disorder, unspecified: Secondary | ICD-10-CM | POA: Diagnosis not present

## 2013-08-28 DIAGNOSIS — N186 End stage renal disease: Secondary | ICD-10-CM | POA: Diagnosis not present

## 2013-08-28 DIAGNOSIS — Z95818 Presence of other cardiac implants and grafts: Secondary | ICD-10-CM | POA: Diagnosis not present

## 2013-08-28 DIAGNOSIS — Z430 Encounter for attention to tracheostomy: Secondary | ICD-10-CM | POA: Diagnosis not present

## 2013-08-28 DIAGNOSIS — R1310 Dysphagia, unspecified: Secondary | ICD-10-CM | POA: Diagnosis not present

## 2013-08-28 DIAGNOSIS — I5030 Unspecified diastolic (congestive) heart failure: Secondary | ICD-10-CM | POA: Diagnosis not present

## 2013-08-28 DIAGNOSIS — Z4682 Encounter for fitting and adjustment of non-vascular catheter: Secondary | ICD-10-CM | POA: Diagnosis not present

## 2013-08-28 DIAGNOSIS — I120 Hypertensive chronic kidney disease with stage 5 chronic kidney disease or end stage renal disease: Secondary | ICD-10-CM | POA: Diagnosis not present

## 2013-08-28 DIAGNOSIS — J9620 Acute and chronic respiratory failure, unspecified whether with hypoxia or hypercapnia: Secondary | ICD-10-CM | POA: Diagnosis not present

## 2013-08-28 DIAGNOSIS — J988 Other specified respiratory disorders: Secondary | ICD-10-CM | POA: Diagnosis not present

## 2013-08-28 DIAGNOSIS — E662 Morbid (severe) obesity with alveolar hypoventilation: Secondary | ICD-10-CM | POA: Diagnosis not present

## 2013-08-28 DIAGNOSIS — R0989 Other specified symptoms and signs involving the circulatory and respiratory systems: Secondary | ICD-10-CM | POA: Diagnosis not present

## 2013-08-28 DIAGNOSIS — J9600 Acute respiratory failure, unspecified whether with hypoxia or hypercapnia: Secondary | ICD-10-CM | POA: Diagnosis not present

## 2013-08-28 LAB — HEPATITIS B SURFACE ANTIGEN: Hepatitis B Surface Ag: NEGATIVE

## 2013-08-28 LAB — RENAL FUNCTION PANEL
BUN: 138 mg/dL — ABNORMAL HIGH (ref 6–23)
Calcium: 9 mg/dL (ref 8.4–10.5)
Glucose, Bld: 231 mg/dL — ABNORMAL HIGH (ref 70–99)
Phosphorus: 10.7 mg/dL — ABNORMAL HIGH (ref 2.3–4.6)
Potassium: 6.1 mEq/L — ABNORMAL HIGH (ref 3.5–5.1)

## 2013-08-28 LAB — CBC
HCT: 29.3 % — ABNORMAL LOW (ref 39.0–52.0)
MCHC: 34.5 g/dL (ref 30.0–36.0)
MCV: 90.2 fL (ref 78.0–100.0)
RDW: 13.2 % (ref 11.5–15.5)

## 2013-08-28 LAB — GLUCOSE, CAPILLARY
Glucose-Capillary: 211 mg/dL — ABNORMAL HIGH (ref 70–99)
Glucose-Capillary: 193 mg/dL — ABNORMAL HIGH (ref 70–99)
Glucose-Capillary: 243 mg/dL — ABNORMAL HIGH (ref 70–99)
Glucose-Capillary: 242 mg/dL — ABNORMAL HIGH (ref 70–99)

## 2013-08-28 MED ORDER — CALCIUM ACETATE 667 MG PO CAPS: 1334.0000 mg | ORAL_CAPSULE | Freq: Three times a day (TID) | ORAL | Status: DC

## 2013-08-28 MED ORDER — FLUTICASONE PROPIONATE 50 MCG/ACT NA SUSP
2.0000 | Freq: Every day | NASAL | Status: DC
  Filled 2013-08-28: qty 16

## 2013-08-28 MED ORDER — NEPRO/CARBSTEADY PO LIQD: 237.0000 mL | ORAL | Status: DC | PRN

## 2013-08-28 MED ORDER — DOXERCALCIFEROL 4 MCG/2ML IV SOLN
INTRAVENOUS | Status: AC
  Administered 2013-08-28: 10 ug
  Filled 2013-08-28: qty 6

## 2013-08-28 MED ORDER — HEPARIN SODIUM (PORCINE) 5000 UNIT/ML IJ SOLN: 5000.0000 [IU] | Freq: Three times a day (TID) | INTRAMUSCULAR | Status: DC

## 2013-08-28 MED ORDER — PANTOPRAZOLE SODIUM 40 MG PO TBEC: 40.0000 mg | DELAYED_RELEASE_TABLET | Freq: Every day | ORAL | Status: DC

## 2013-08-28 MED ORDER — DARBEPOETIN ALFA-POLYSORBATE 40 MCG/0.4ML IJ SOLN
INTRAMUSCULAR | Status: AC
  Administered 2013-08-28: 40 ug
  Filled 2013-08-28: qty 0.4

## 2013-08-28 MED ORDER — INSULIN GLARGINE 100 UNIT/ML ~~LOC~~ SOLN: 15.0000 [IU] | Freq: Every day | SUBCUTANEOUS | Status: DC

## 2013-08-28 MED ORDER — FLUTICASONE PROPIONATE 50 MCG/ACT NA SUSP: 2.0000 | Freq: Every day | NASAL | Status: DC

## 2013-08-28 MED ORDER — DOXERCALCIFEROL 4 MCG/2ML IV SOLN: 10.0000 ug | INTRAVENOUS | Status: DC

## 2013-08-28 MED ORDER — BENZONATATE 100 MG PO CAPS: 100.0000 mg | ORAL_CAPSULE | Freq: Two times a day (BID) | ORAL | Status: DC | PRN

## 2013-08-28 MED ORDER — HEPARIN SODIUM (PORCINE) 1000 UNIT/ML IJ SOLN
3000.0000 [IU] | Freq: Once | INTRAMUSCULAR | Status: AC
  Administered 2013-08-28: 3000 [IU] via INTRAVENOUS

## 2013-08-28 MED ORDER — DARBEPOETIN ALFA-POLYSORBATE 40 MCG/0.4ML IJ SOLN: 40.0000 ug | INTRAMUSCULAR | Status: DC

## 2013-08-28 NOTE — Progress Notes (Signed)
Trach secured. No breakdown at trach site noted.

## 2013-08-28 NOTE — Procedures (Signed)
I was present at this dialysis session, have reviewed the session itself and made  appropriate changes   Kelly Splinter MD  pager [phone]    cell [phone]  08/28/2013, 4:05 PM

## 2013-08-28 NOTE — Progress Notes (Signed)
PT Cancellation Note  Patient Details Name: Seth [name] MRN: [id number] DOB: 1966-03-06   Cancelled Treatment:    Reason Eval/Treat Not Completed: Patient declined, no reason specified ("saving energy for dialysis, and woke up with a head cold.")   Faustino Congress K 08/28/2013, 10:53 AM  Laureen Abrahams, PT, DPT [phone]

## 2013-08-28 NOTE — Progress Notes (Addendum)
Trach secure. No breakdown noted at trach site. Pt in dialysis

## 2013-08-28 NOTE — Progress Notes (Signed)
Trach secure, site WNL, no breakdown noted at trach site. PMV on trach.

## 2013-08-30 ENCOUNTER — Inpatient Hospital Stay: Payer: Medicare Other | Admitting: Adult Health

## 2013-09-07 NOTE — Discharge Summary (Signed)
Patient seen and examined, agree with above note.  I dictated the care and orders written for this patient under my direction.  Brennan Karam G Gedeon Brandow, MD [phone] 

## 2013-09-09 DIAGNOSIS — N186 End stage renal disease: Secondary | ICD-10-CM | POA: Diagnosis not present

## 2013-09-16 ENCOUNTER — Inpatient Hospital Stay (HOSPITAL_COMMUNITY)
Admission: AD | Admit: 2013-09-16 | Discharge: 2013-09-19 | DRG: 205 | Disposition: A | Discharge status: Home / Self Care | Payer: Medicare Other | Source: Other Acute Inpatient Hospital | Attending: Pulmonary Disease | Admitting: Pulmonary Disease

## 2013-09-16 ENCOUNTER — Encounter (HOSPITAL_COMMUNITY): Payer: Self-pay

## 2013-09-16 ENCOUNTER — Inpatient Hospital Stay (HOSPITAL_COMMUNITY): Payer: Medicare Other

## 2013-09-16 ENCOUNTER — Inpatient Hospital Stay: Admit: 2013-09-16 | Payer: Self-pay | Admitting: Internal Medicine

## 2013-09-16 DIAGNOSIS — J811 Chronic pulmonary edema: Secondary | ICD-10-CM

## 2013-09-16 DIAGNOSIS — E46 Unspecified protein-calorie malnutrition: Secondary | ICD-10-CM

## 2013-09-16 DIAGNOSIS — J399 Disease of upper respiratory tract, unspecified: Secondary | ICD-10-CM | POA: Diagnosis present

## 2013-09-16 DIAGNOSIS — J9620 Acute and chronic respiratory failure, unspecified whether with hypoxia or hypercapnia: Secondary | ICD-10-CM

## 2013-09-16 DIAGNOSIS — Z7901 Long term (current) use of anticoagulants: Secondary | ICD-10-CM

## 2013-09-16 DIAGNOSIS — G4733 Obstructive sleep apnea (adult) (pediatric): Secondary | ICD-10-CM | POA: Diagnosis present

## 2013-09-16 DIAGNOSIS — E785 Hyperlipidemia, unspecified: Secondary | ICD-10-CM

## 2013-09-16 DIAGNOSIS — D631 Anemia in chronic kidney disease: Secondary | ICD-10-CM | POA: Diagnosis present

## 2013-09-16 DIAGNOSIS — Z794 Long term (current) use of insulin: Secondary | ICD-10-CM

## 2013-09-16 DIAGNOSIS — Z992 Dependence on renal dialysis: Secondary | ICD-10-CM

## 2013-09-16 DIAGNOSIS — N2581 Secondary hyperparathyroidism of renal origin: Secondary | ICD-10-CM | POA: Diagnosis present

## 2013-09-16 DIAGNOSIS — Z6841 Body Mass Index (BMI) 40.0 and over, adult: Secondary | ICD-10-CM

## 2013-09-16 DIAGNOSIS — N186 End stage renal disease: Secondary | ICD-10-CM

## 2013-09-16 DIAGNOSIS — E8729 Other acidosis: Secondary | ICD-10-CM

## 2013-09-16 DIAGNOSIS — Z930 Tracheostomy status: Secondary | ICD-10-CM

## 2013-09-16 DIAGNOSIS — Z79899 Other long term (current) drug therapy: Secondary | ICD-10-CM

## 2013-09-16 DIAGNOSIS — J988 Other specified respiratory disorders: Secondary | ICD-10-CM

## 2013-09-16 DIAGNOSIS — D649 Anemia, unspecified: Secondary | ICD-10-CM

## 2013-09-16 DIAGNOSIS — J209 Acute bronchitis, unspecified: Secondary | ICD-10-CM

## 2013-09-16 DIAGNOSIS — Z833 Family history of diabetes mellitus: Secondary | ICD-10-CM

## 2013-09-16 DIAGNOSIS — J9503 Malfunction of tracheostomy stoma: Principal | ICD-10-CM | POA: Diagnosis present

## 2013-09-16 DIAGNOSIS — Z7982 Long term (current) use of aspirin: Secondary | ICD-10-CM

## 2013-09-16 DIAGNOSIS — J989 Respiratory disorder, unspecified: Secondary | ICD-10-CM | POA: Diagnosis present

## 2013-09-16 DIAGNOSIS — I10 Essential (primary) hypertension: Secondary | ICD-10-CM

## 2013-09-16 DIAGNOSIS — I120 Hypertensive chronic kidney disease with stage 5 chronic kidney disease or end stage renal disease: Secondary | ICD-10-CM | POA: Diagnosis present

## 2013-09-16 DIAGNOSIS — G9340 Encephalopathy, unspecified: Secondary | ICD-10-CM

## 2013-09-16 DIAGNOSIS — E872 Acidosis: Secondary | ICD-10-CM

## 2013-09-16 DIAGNOSIS — E119 Type 2 diabetes mellitus without complications: Secondary | ICD-10-CM

## 2013-09-16 DIAGNOSIS — J9600 Acute respiratory failure, unspecified whether with hypoxia or hypercapnia: Secondary | ICD-10-CM

## 2013-09-16 DIAGNOSIS — F1210 Cannabis abuse, uncomplicated: Secondary | ICD-10-CM | POA: Diagnosis present

## 2013-09-16 LAB — COMPREHENSIVE METABOLIC PANEL
Albumin: 3.2 g/dL — ABNORMAL LOW (ref 3.5–5.2)
Alkaline Phosphatase: 54 U/L (ref 39–117)
BUN: 34 mg/dL — ABNORMAL HIGH (ref 6–23)
CO2: 29 mEq/L (ref 19–32)
Chloride: 92 mEq/L — ABNORMAL LOW (ref 96–112)
Creatinine, Ser: 10.35 mg/dL — ABNORMAL HIGH (ref 0.50–1.35)
GFR calc Af Amer: 6 mL/min — ABNORMAL LOW (ref >90)
GFR calc non Af Amer: 5 mL/min — ABNORMAL LOW (ref >90)
Glucose, Bld: 107 mg/dL — ABNORMAL HIGH (ref 70–99)
Potassium: 4.5 mEq/L (ref 3.5–5.1)
Sodium: 131 mEq/L — ABNORMAL LOW (ref 135–145)
Total Bilirubin: 0.2 mg/dL — ABNORMAL LOW (ref 0.3–1.2)
Total Protein: 7.6 g/dL (ref 6.0–8.3)

## 2013-09-16 LAB — CBC WITH DIFFERENTIAL/PLATELET
Basophils Absolute: 0.1 10*3/uL (ref 0.0–0.1)
Eosinophils Absolute: 0.4 10*3/uL (ref 0.0–0.7)
Eosinophils Relative: 6 % — ABNORMAL HIGH (ref 0–5)
HCT: 27 % — ABNORMAL LOW (ref 39.0–52.0)
Hemoglobin: 8.6 g/dL — ABNORMAL LOW (ref 13.0–17.0)
Lymphocytes Relative: 24 % (ref 12–46)
Lymphs Abs: 1.6 10*3/uL (ref 0.7–4.0)
MCH: 30.3 pg (ref 26.0–34.0)
MCV: 95.1 fL (ref 78.0–100.0)
Monocytes Relative: 8 % (ref 3–12)
Neutro Abs: 4.2 10*3/uL (ref 1.7–7.7)
Neutrophils Relative %: 62 % (ref 43–77)
Platelets: 200 10*3/uL (ref 150–400)
RBC: 2.84 MIL/uL — ABNORMAL LOW (ref 4.22–5.81)
RDW: 12.7 % (ref 11.5–15.5)
WBC: 6.8 10*3/uL (ref 4.0–10.5)

## 2013-09-16 LAB — MRSA PCR SCREENING: MRSA by PCR: NEGATIVE

## 2013-09-16 LAB — GLUCOSE, CAPILLARY: Glucose-Capillary: 178 mg/dL — ABNORMAL HIGH (ref 70–99)

## 2013-09-16 LAB — APTT: aPTT: 40 seconds — ABNORMAL HIGH (ref 24–37)

## 2013-09-16 LAB — PROTIME-INR
INR: 0.93 (ref 0.00–1.49)
Prothrombin Time: 12.3 seconds (ref 11.6–15.2)

## 2013-09-16 MED ORDER — FLUTICASONE PROPIONATE 50 MCG/ACT NA SUSP
2.0000 | Freq: Every day | NASAL | Status: DC
  Administered 2013-09-17 – 2013-09-19 (×3): 2 via NASAL
  Filled 2013-09-16: qty 16

## 2013-09-16 MED ORDER — HEPARIN SODIUM (PORCINE) 5000 UNIT/ML IJ SOLN
5000.0000 [IU] | Freq: Three times a day (TID) | INTRAMUSCULAR | Status: DC
  Administered 2013-09-16 – 2013-09-17 (×2): 5000 [IU] via SUBCUTANEOUS
  Filled 2013-09-16 (×5): qty 1

## 2013-09-16 MED ORDER — BENZONATATE 100 MG PO CAPS
100.0000 mg | ORAL_CAPSULE | Freq: Two times a day (BID) | ORAL | Status: DC | PRN
  Filled 2013-09-16: qty 1

## 2013-09-16 MED ORDER — LIDOCAINE VISCOUS 2 % MT SOLN
15.0000 mL | Freq: Once | OROMUCOSAL | Status: AC
  Administered 2013-09-16: 2 mL via OROMUCOSAL
  Filled 2013-09-16: qty 15

## 2013-09-16 MED ORDER — CALCIUM ACETATE 667 MG PO CAPS
1334.0000 mg | ORAL_CAPSULE | Freq: Three times a day (TID) | ORAL | Status: DC
  Administered 2013-09-17 – 2013-09-19 (×8): 1334 mg via ORAL
  Filled 2013-09-16 (×11): qty 2

## 2013-09-16 MED ORDER — CHLORHEXIDINE GLUCONATE 0.12 % MT SOLN
15.0000 mL | Freq: Two times a day (BID) | OROMUCOSAL | Status: DC
  Administered 2013-09-16 – 2013-09-17 (×3): 15 mL via OROMUCOSAL
  Filled 2013-09-16 (×2): qty 15

## 2013-09-16 MED ORDER — INSULIN ASPART 100 UNIT/ML ~~LOC~~ SOLN: 0.0000 [IU] | Freq: Three times a day (TID) | SUBCUTANEOUS | Status: DC

## 2013-09-16 MED ORDER — BIOTENE DRY MOUTH MT LIQD
15.0000 mL | Freq: Two times a day (BID) | OROMUCOSAL | Status: DC
  Administered 2013-09-17 (×2): 15 mL via OROMUCOSAL

## 2013-09-16 MED ORDER — PANTOPRAZOLE SODIUM 40 MG IV SOLR: 40.0000 mg | INTRAVENOUS | Status: DC

## 2013-09-16 MED ORDER — ASPIRIN EC 81 MG PO TBEC
81.0000 mg | DELAYED_RELEASE_TABLET | Freq: Every day | ORAL | Status: DC
  Administered 2013-09-16 – 2013-09-19 (×4): 81 mg via ORAL
  Filled 2013-09-16 (×5): qty 1

## 2013-09-16 MED ORDER — MIDAZOLAM HCL 2 MG/2ML IJ SOLN
INTRAMUSCULAR | Status: AC
  Filled 2013-09-16: qty 4

## 2013-09-16 MED ORDER — AMLODIPINE BESYLATE 10 MG PO TABS
10.0000 mg | ORAL_TABLET | Freq: Every day | ORAL | Status: DC
  Administered 2013-09-16 – 2013-09-19 (×4): 10 mg via ORAL
  Filled 2013-09-16 (×4): qty 1

## 2013-09-16 MED ORDER — NEPRO/CARBSTEADY PO LIQD
237.0000 mL | ORAL | Status: DC | PRN
  Filled 2013-09-16: qty 237

## 2013-09-16 MED ORDER — FENTANYL CITRATE 0.05 MG/ML IJ SOLN
INTRAMUSCULAR | Status: AC
  Filled 2013-09-16: qty 4

## 2013-09-16 MED ORDER — INSULIN GLARGINE 100 UNIT/ML ~~LOC~~ SOLN
15.0000 [IU] | Freq: Every day | SUBCUTANEOUS | Status: DC
  Administered 2013-09-16 – 2013-09-18 (×3): 15 [IU] via SUBCUTANEOUS
  Filled 2013-09-16 (×4): qty 0.15

## 2013-09-16 MED ORDER — PANTOPRAZOLE SODIUM 40 MG PO TBEC: 40.0000 mg | DELAYED_RELEASE_TABLET | Freq: Every day | ORAL | Status: DC

## 2013-09-16 MED ORDER — TAMSULOSIN HCL 0.4 MG PO CAPS
0.4000 mg | ORAL_CAPSULE | Freq: Every day | ORAL | Status: DC
  Administered 2013-09-16 – 2013-09-19 (×4): 0.4 mg via ORAL
  Filled 2013-09-16 (×4): qty 1

## 2013-09-16 NOTE — Procedures (Signed)
Bronchoscopy Procedure Note Seth [name] [id number] 1966/08/12  Procedure: Bronchoscopy Indications: Diagnostic evaluation of the airways  Procedure Details Consent: Risks of procedure as well as the alternatives and risks of each were explained to the (patient/caregiver).  Consent for procedure obtained. Time Out: Verified patient identification, verified procedure, site/side was marked, verified correct patient position, special equipment/implants available, medications/allergies/relevent history reviewed, required imaging and test results available.  Performed  In preparation for procedure, patient was given 100% FiO2 and bronchoscope lubricated. Sedation: None  Airway entered and the following bronchi were examined: RUL, RML, RLL, LUL, LLL and Bronchi.   Bronchoscope removed.    Evaluation Hemodynamic Status: BP stable throughout; O2 sats: stable throughout Patient's Current Condition: stable Specimens:  None Complications: No apparent complications Patient did tolerate procedure well.  Laureano Hetzer 09/16/2013

## 2013-09-16 NOTE — H&P (Signed)
PULMONARY  / CRITICAL CARE MEDICINE  Name: Seth [name] MRN: [id number] DOB: Sep 24, 1966    ADMISSION DATE:  09/16/2013  REFERRING MD :  [facility] PRIMARY SERVICE:  PCCM  CHIEF COMPLAINT:  Tracheostomy Malfunction  BRIEF PATIENT DESCRIPTION: 47 y/o M with recent admit for respiratory failure requiring emergent placement of bedside trach / revision per ENT.  Returns post trach change at [facility] with obstructed trach.    SIGNIFICANT EVENTS / STUDIES:  12/08 - Admit with malfunctioning trach   LINES / TUBES: Chronic Trach>>>  ANTIBIOTICS:   HISTORY OF PRESENT ILLNESS:  47 y/o M with PMH of DM, HTN, CKD, Remote Retinal Detachement with recent admit for respiratory failure requiring emergent placement of bedside trach / revision per ENT.  Returns from [facility] with trach malfunction.  Lurline Idol was changed to #4 short.  Unable to pass suction cath. And no air movement from trach.  Patient was up on am of admit, ambulatory, doing yoga.  [facility] had been capping pts trach and placing him on bipap QHS.  Tx to [facility] for eval. No acute complaints.   PAST MEDICAL HISTORY :  Past Medical History  Diagnosis Date  . Diabetes mellitus   . Hypertension   . Chronic kidney disease   . 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 Av fistula placement  05/03/2012    Procedure: ARTERIOVENOUS (AV) FISTULA CREATION;  Surgeon: Mal [name], MD;  Location: [place];  Service: Vascular;  Laterality: Right;  . Tracheostomy tube placement N/A 08/20/2013    Procedure: TRACHEOSTOMY Revision;  Surgeon: Melida Quitter, MD;  Location: [place];  Service: ENT;  Laterality: N/A;   Prior to Admission medications   Medication Sig Start Date End Date Taking? Authorizing Provider  amLODipine (NORVASC) 10 MG tablet Take 10 mg by mouth daily.   Yes Historical Provider, MD  aspirin EC 81 MG tablet Take 81 mg by mouth daily.    Yes Historical Provider, MD  benzonatate (TESSALON) 100 MG capsule Take 100 mg by mouth 2 (two) times daily as needed for cough.   Yes Historical Provider, MD  calcium acetate (PHOSLO) 667 MG capsule Take 1,334 mg by mouth 3 (three) times daily with meals.   Yes Historical Provider, MD  darbepoetin (ARANESP) 40 MCG/0.4ML SOLN injection Inject 40 mcg into the skin every Wednesday. *with hemodialysis*   Yes Historical Provider, MD  fluticasone (FLONASE) 50 MCG/ACT nasal spray Place 2 sprays into both nostrils daily.   Yes Historical Provider, MD  heparin 5000 UNIT/ML injection Inject 5,000 Units into the skin every 8 (eight) hours.   Yes Historical Provider, MD  insulin aspart (NOVOLOG) 100 UNIT/ML injection Inject 0-15 Units into the skin 3 (three) times daily with meals. 05/05/12  Yes [name], DO  insulin glargine (LANTUS) 100 UNIT/ML injection Inject 15 Units into the skin at bedtime.   Yes Historical Provider, MD  Nutritional Supplements (FEEDING SUPPLEMENT, NEPRO CARB STEADY,) LIQD Take 237 mLs by mouth as needed (for missed meal during dialysis).   Yes Historical Provider, MD  pantoprazole (PROTONIX) 40 MG tablet Take 40 mg by mouth daily.   Yes Historical Provider, MD  Tamsulosin HCl (FLOMAX) 0.4 MG CAPS Take 0.4 mg by mouth daily.   Yes Historical Provider, MD  Vitamin D, Ergocalciferol, (DRISDOL) 50000 UNITS CAPS capsule Take 50,000 Units by mouth every 7 (seven)  days.   Yes Historical Provider, MD   No Known Allergies  FAMILY HISTORY:  Family History  Problem Relation Age of Onset  . Diabetes Mother   . Diabetes Sister   . Diabetes Brother    SOCIAL HISTORY:  reports that he has never smoked. He has never used smokeless tobacco. He reports that he drinks about 0.6 ounces of alcohol per week. He reports that he uses illicit drugs (Marijuana).  REVIEW OF SYSTEMS:   Constitutional: Negative for fever, chills, weight loss, malaise/fatigue and diaphoresis.  HENT: Negative  for hearing loss, ear pain, nosebleeds, congestion, sore throat, neck pain, tinnitus and ear discharge.   Eyes: Negative for blurred vision, double vision, photophobia, pain, discharge and redness.  Respiratory: Negative for cough, hemoptysis, sputum production, shortness of breath, wheezing and stridor.   Cardiovascular: Negative for chest pain, palpitations, orthopnea, claudication, leg swelling and PND.  Gastrointestinal: Negative for heartburn, nausea, vomiting, abdominal pain, diarrhea, constipation, blood in stool and melena.  Genitourinary: Negative for dysuria, urgency, frequency, hematuria and flank pain.  Musculoskeletal: Negative for myalgias, back pain, joint pain and falls.  Skin: Negative for itching and rash.  Neurological: Negative for dizziness, tingling, tremors, sensory change, speech change, focal weakness, seizures, loss of consciousness, weakness and headaches.  Endo/Heme/Allergies: Negative for environmental allergies and polydipsia. Does not bruise/bleed easily.  SUBJECTIVE:   VITAL SIGNS:    PHYSICAL EXAMINATION: General:  Obese male in NAD Neuro:  AAOx4, speech clear, MAE HEENT:  Mm pink/moist, short thick neck, #6 proximal XL trach placed at bedside Cardiovascular:  s1s2 rrr, no m/r/g Lungs:  resp's even/non-labored, lungs bilaterally clear Abdomen:  Obese/soft, bsx4 active Musculoskeletal:  No acute deformities Skin:  Warm/dry, no edema  No results found for this basename: NA, K, CL, CO2, BUN, CREATININE, GLUCOSE,  in the last 168 hours No results found for this basename: HGB, HCT, WBC, PLT,  in the last 168 hours No results found.  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  S/P emergent trach placement in 08/2013.  Revision per ENT.    Plan: -viscous lidocaine into trach -change trach over bronch -#6 XLT trach - patient needs proximal length due to body habitus. -would not consider change to standard trach  length unless significant weight reduction (>100lbs) -Would recommend he not be de-cannulated unless he has had significant weight reduction (>100lbs) due to difficult airway history. Even then would not without coordinated effort of ENT & Pulmonary.  -CXR post trach placement   Likely d/c in am back to [facility].    Noe Gens, NP-C [place] [facility] Pgr: [phone] or [phone]  09/16/2013, 5:19 PM  Upper airway obstruction due to tracheostomy placed that is too short and was not in the airway.  Changed as above.  Will hold in the ICU for observation overnight then likely d/c in AM.  CC time 45 min.  Patient seen and examined, agree with above note.  I dictated the care and orders written for this patient under my direction.  Rush Farmer, MD [phone]

## 2013-09-17 DIAGNOSIS — J988 Other specified respiratory disorders: Secondary | ICD-10-CM | POA: Diagnosis not present

## 2013-09-17 DIAGNOSIS — I120 Hypertensive chronic kidney disease with stage 5 chronic kidney disease or end stage renal disease: Secondary | ICD-10-CM | POA: Diagnosis not present

## 2013-09-17 DIAGNOSIS — N186 End stage renal disease: Secondary | ICD-10-CM | POA: Diagnosis not present

## 2013-09-17 DIAGNOSIS — Z930 Tracheostomy status: Secondary | ICD-10-CM | POA: Diagnosis not present

## 2013-09-17 DIAGNOSIS — J9600 Acute respiratory failure, unspecified whether with hypoxia or hypercapnia: Secondary | ICD-10-CM | POA: Diagnosis not present

## 2013-09-17 DIAGNOSIS — D631 Anemia in chronic kidney disease: Secondary | ICD-10-CM | POA: Diagnosis not present

## 2013-09-17 LAB — GLUCOSE, CAPILLARY: Glucose-Capillary: 111 mg/dL — ABNORMAL HIGH (ref 70–99)

## 2013-09-17 MED ORDER — HEPARIN SODIUM (PORCINE) 1000 UNIT/ML DIALYSIS
5000.0000 [IU] | INTRAMUSCULAR | Status: DC | PRN
  Administered 2013-09-17: 5000 [IU] via INTRAVENOUS_CENTRAL
  Filled 2013-09-17: qty 5

## 2013-09-17 MED ORDER — INSULIN ASPART 100 UNIT/ML ~~LOC~~ SOLN
0.0000 [IU] | Freq: Three times a day (TID) | SUBCUTANEOUS | Status: DC
  Administered 2013-09-17: 3 [IU] via SUBCUTANEOUS
  Administered 2013-09-18: 4 [IU] via SUBCUTANEOUS
  Administered 2013-09-19 (×3): 3 [IU] via SUBCUTANEOUS

## 2013-09-17 NOTE — Progress Notes (Signed)
Patient taken to Hemodialysis via wheelchair. Onuchukwu, Lonny Prude, RN

## 2013-09-17 NOTE — Progress Notes (Signed)
PULMONARY  / CRITICAL CARE MEDICINE  Name: Seth [name] MRN: [id number] DOB: 11-Feb-1966    ADMISSION DATE:  09/16/2013  REFERRING MD :  [facility] PRIMARY SERVICE:  PCCM  CHIEF COMPLAINT:  Tracheostomy Malfunction  BRIEF PATIENT DESCRIPTION: 47 y/o M with recent admit for respiratory failure requiring emergent placement of bedside trach / revision per ENT.  Returns post trach change at [facility] with obstructed trach.    SIGNIFICANT EVENTS / STUDIES:  12/08 - Admit with malfunctioning trach.  Trach changed over bronch to #6 XLT  LINES / TUBES: Trach Change #6 XLT 12/8>>>   SUBJECTIVE: Pt denies acute c/o's.  No distress.   VITAL SIGNS: Temp:  [98 F (36.7 C)-99.1 F (37.3 C)] 98 F (36.7 C) (12/09 0909) Pulse Rate:  [83-101] 92 (12/09 0700) Resp:  [17-30] 21 (12/09 0700) BP: (125-147)/(51-78) 147/78 mmHg (12/09 0910) SpO2:  [91 %-100 %] 97 % (12/09 0700) Weight:  [344 lb 2.2 oz (156.1 kg)] 344 lb 2.2 oz (156.1 kg) (12/09 0500)  PHYSICAL EXAMINATION: General:  Obese male in NAD Neuro:  AAOx4, speech clear, MAE HEENT:  Mm pink/moist, short thick neck, #6 proximal XL trach c/d/i Cardiovascular:  s1s2 rrr, no m/r/g Lungs:  resp's even/non-labored, lungs bilaterally clear Abdomen:  Obese/soft, bsx4 active Musculoskeletal:  No acute deformities Skin:  Warm/dry, no edema   Recent Labs Lab 09/16/13 1841  NA 131*  K 4.5  CL 92*  CO2 29  BUN 34*  CREATININE 10.35*  GLUCOSE 107*    Recent Labs Lab 09/16/13 1841  HGB 8.6*  HCT 27.0*  WBC 6.8  PLT 200   Dg Chest Port 1 View  09/16/2013   CLINICAL DATA:  Trach placement  EXAM: PORTABLE CHEST - 1 VIEW  COMPARISON:  08/25/2013  FINDINGS: Tracheostomy tip in the mid trachea at approximately T3. This is roughly 5.4 cm above the carina. Stable cardiomegaly with vascular congestion. No focal pneumonia, collapse or consolidation. No effusion or pneumothorax.  IMPRESSION: Tracheostomy in the mid trachea.  Cardiomegaly with  vascular congestion   Electronically Signed   By: Daryll Brod M.D.   On: 09/16/2013 18:21    ASSESSMENT / PLAN:  Tracheostomy Malfunction - history of extremely difficult airway.  S/P emergent trach placement in 08/2013.  Revision per ENT.   OSA  Plan: -#6 XLT trach - patient needs proximal length due to body habitus.  Would not reduce size further.   -have partnered with patient that if he can lose weight to 275 lbs, we will de-cannulate and then return him to bipap for OSA -wound consult for trach site home care regimen -attempting to arrange for PCP with the [facility] [phone])  -plan for trach clinic follow up   Morbid Obesity   Plan: -have called [facility] and awaiting call for outpt assistance exercise -arrange for home PT to give pt exercise program for home   ESRD  Plan: -renal consult.  Have discussed case with Dr. Clover Mealy regarding outpatient need for HD.  They would like to observe him during HD while inpatient tomorrow before consideration for outpatient HD with tracheostomy status.  -If needed, I will come to the HD Center and provide trach education for the nursing staff Noe Gens - pgr [phone])   HTN  Plan: -continue norvasc -ASA   Transfer to Medical Floor.  Hopeful for discharge to home after HD 12/10.  Feel this is in patients best interest.    Noe Gens, NP-C  [place] [facility] Pgr: [phone] or [phone]  09/17/2013, 9:58 AM   PCCM ATTENDING: I have interviewed and examined the patient and reviewed the database. I have formulated the assessment and plan as reflected in the note above with amendments made by me. 40 mins of direct critical care time provided  Merton Border, MD;  PCCM service; Mobile [phone]

## 2013-09-17 NOTE — Consult Note (Addendum)
WOC wound consult note Reason for Consult: Consult requested for wound near trach.  Wound type:Full thickness wound near trach site; located at 6:00 o'clock and under faceplate. Wound appears to be a result of chronic moisture and pressure; difficult to reduce pressure R/T way pt's neck bends, size of neck, and location of trach plastic faceplate. Has been admitted from another facility. Measurement: 1X1X.1cm Wound bed: 100% red and moist Drainage (amount, consistency, odor) Mod amt yellow drainage, no odor. Periwound:Intact skin surrounding. Dressing procedure/placement/frequency: Aquacel to absorb drainage and provide antimicrobial benefits.  Foam dressing to protect site and reduce pressure. Pt plans to have home health assistance with dressing changes according to progress notes. Please re-consult if further assistance is needed.  Thank-you,  Julien Girt MSN, [place], [place], [place], [place]

## 2013-09-17 NOTE — Progress Notes (Signed)
Patient stated that he didn't know his pressure for CPAP.  Applied the CPAP  With a nasal mask on Auto with a max pressure of 20 and minimum of 6.  Pt vitals are stable and no distress noted. RT will continue to monitor.

## 2013-09-17 NOTE — Progress Notes (Signed)
09/17/2013- Resp Care Note- Trach teaching done with patient.  Booklet on trach teaching reviewed and left with patient.  No issues noted at time of teaching.  Pt very receptive with education provided.  S Zeya Balles rrt, rcp

## 2013-09-17 NOTE — Procedures (Signed)
Patient seen on Hemodialysis. QB 350, UF goal 3.5L Treatment adjusted as needed.  Elmarie Shiley MD [facility]. Office # [phone] Pager # [phone] 4:32 PM

## 2013-09-17 NOTE — Progress Notes (Signed)
Pt admitted to the unit from 47M. Pt is alert and oriented. Pt oriented to room, staff, and call bell. Educated on fall safety plan. Call bell with in reach. Educated to call for assists. Will continue to monitor. Mady Gemma, RN

## 2013-09-17 NOTE — Care Management Note (Signed)
  Page 2 of 2   09/17/2013     2:13:42 PM   CARE MANAGEMENT NOTE 09/17/2013  Patient:  Seth [name], Seth [name]   Account Number:  [id number]  Date Initiated:  09/17/2013  Documentation initiated by:  [facility]  Subjective/Objective Assessment:   Readmission from [facility] with malfunctioning trach.  Placed on bipap - hypercapnea     Action/Plan:   Anticipated DC Date:  09/18/2013   Anticipated DC Plan:  LONG TERM ACUTE CARE (LTAC)      DC Planning Services  CM consult      Choice offered to / List presented to:     DME arranged  [place]      DME agency  [place] arranged  HH-1 RN  HH-10 DISEASE MANAGEMENT  HH-2 PT  [place].   Status of service:  In process, will continue to follow Medicare Important Message given?   (If response is "NO", the following Medicare IM given date fields will be blank) Date Medicare IM given:   Date Additional Medicare IM given:    Discharge Disposition:    Per UR Regulation:  Reviewed for med. necessity/level of care/duration of stay  If discussed at [place] of Stay Meetings, dates discussed:    Comments:  09-17-13 12:10pm Luz Lex, [place] [phone] Plan for patient to go home with [facility] from [facility] per choice after dialysis tomorrow and once confirmed that dialysis center will take trach.  Referral called to [facility]  Lurline Idol is plugged during day and unplugged at night.  Does not need home oxygen.  Talked with respiratory therapy about making sure pateint has had and is able to care for trach at home. Will also be sending home with him for emergency purposes - home suction and a smaller size trach. Appt made at [facility] and Wellness clinic for followup on Jan 5th at 4pm. - On discharge summary. Respiratory confirmed that they have gone over trach care and he is good for discharge.

## 2013-09-17 NOTE — Consult Note (Signed)
Reason for Consult: Continuity of ESRD care Referring Physician: Dr. Karrie Doffing care medicine  HPI:  47 year old African American man with past medical history significant for morbid obesity status post tracheostomy for chronic hypercapnic respiratory failure from severe obstructive sleep apnea. He also has end-stage renal disease on hemodialysis -Monday/Wednesday/Friday most recently at [facility]. He was brought to the hospital yesterday with a blocked/malfunctioning tracheostomy and underwent exchange over bronchoscopy 2 tracheostomy that is now functioning well and currently capped. The patient has done well overnight and not had any respiratory decompensation. He was dialyzed yesterday. The question also arises as to whether he is safe and in a position to be cared for at outpatient hemodialysis.  I reviewed his notes from [facility] and unfortunately do not have any dialysis flow sheets-requested.  Past Medical History  Diagnosis Date  . Diabetes mellitus   . Hypertension   . Chronic kidney disease   . 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 Av fistula placement  05/03/2012    Procedure: ARTERIOVENOUS (AV) FISTULA CREATION;  Surgeon: Mal [name], MD;  Location: [facility];  Service: Vascular;  Laterality: Right;  . Tracheostomy tube placement N/A 08/20/2013    Procedure: TRACHEOSTOMY Revision;  Surgeon: Melida Quitter, MD;  Location: [facility] OR;  Service: ENT;  Laterality: N/A;    Family History  Problem Relation Age of Onset  . Diabetes Mother   . Diabetes Sister   . Diabetes Brother     Social History:  reports that he has never smoked. He has never used smokeless tobacco. He reports that he drinks about 0.6 ounces of alcohol per week. He reports that he uses illicit drugs (Marijuana).  Allergies: No Known Allergies  Medications:  Scheduled: . amLODipine  10 mg  Oral Daily  . antiseptic oral rinse  15 mL Mouth Rinse q12n4p  . aspirin EC  81 mg Oral Daily  . calcium acetate  1,334 mg Oral TID WC  . chlorhexidine  15 mL Mouth Rinse BID  . fluticasone  2 spray Each Nare Daily  . insulin aspart  0-15 Units Subcutaneous TID WC  . insulin glargine  15 Units Subcutaneous QHS  . tamsulosin  0.4 mg Oral Daily    Results for orders placed during the hospital encounter of 09/16/13 (from the past 48 hour(s))  MRSA PCR SCREENING     Status: None   Collection Time    09/16/13  4:52 PM      Result Value Range   MRSA by PCR NEGATIVE  NEGATIVE   Comment:            The GeneXpert MRSA Assay (FDA     approved for NASAL specimens     only), is one component of a     comprehensive MRSA colonization     surveillance program. It is not     intended to diagnose MRSA     infection nor to guide or     monitor treatment for     MRSA infections.  GLUCOSE, CAPILLARY     Status: Abnormal   Collection Time    09/16/13  5:02 PM      Result Value Range   Glucose-Capillary 178 (*) 70 - 99 mg/dL  APTT     Status: Abnormal   Collection Time    09/16/13  6:41 PM  Result Value Range   aPTT 40 (*) 24 - 37 seconds   Comment:            IF BASELINE aPTT IS ELEVATED,     SUGGEST PATIENT RISK ASSESSMENT     BE USED TO DETERMINE APPROPRIATE     ANTICOAGULANT THERAPY.  PROTIME-INR     Status: None   Collection Time    09/16/13  6:41 PM      Result Value Range   Prothrombin Time 12.3  11.6 - 15.2 seconds   INR 0.93  0.00 - 1.49  COMPREHENSIVE METABOLIC PANEL     Status: Abnormal   Collection Time    09/16/13  6:41 PM      Result Value Range   Sodium 131 (*) 135 - 145 mEq/L   Potassium 4.5  3.5 - 5.1 mEq/L   Chloride 92 (*) 96 - 112 mEq/L   CO2 29  19 - 32 mEq/L   Glucose, Bld 107 (*) 70 - 99 mg/dL   BUN 34 (*) 6 - 23 mg/dL   Creatinine, Ser 10.35 (*) 0.50 - 1.35 mg/dL   Calcium 8.4  8.4 - 10.5 mg/dL   Total Protein 7.6  6.0 - 8.3 g/dL   Albumin 3.2 (*)  3.5 - 5.2 g/dL   AST 24  0 - 37 U/L   ALT 29  0 - 53 U/L   Alkaline Phosphatase 54  39 - 117 U/L   Total Bilirubin 0.2 (*) 0.3 - 1.2 mg/dL   GFR calc non Af Amer 5 (*) >90 mL/min   GFR calc Af Amer 6 (*) >90 mL/min   Comment: (NOTE)     The eGFR has been calculated using the CKD EPI equation.     This calculation has not been validated in all clinical situations.     eGFR's persistently <90 mL/min signify possible Chronic Kidney     Disease.  CBC WITH DIFFERENTIAL     Status: Abnormal   Collection Time    09/16/13  6:41 PM      Result Value Range   WBC 6.8  4.0 - 10.5 K/uL   RBC 2.84 (*) 4.22 - 5.81 MIL/uL   Hemoglobin 8.6 (*) 13.0 - 17.0 g/dL   HCT 27.0 (*) 39.0 - 52.0 %   MCV 95.1  78.0 - 100.0 fL   MCH 30.3  26.0 - 34.0 pg   MCHC 31.9  30.0 - 36.0 g/dL   RDW 12.7  11.5 - 15.5 %   Platelets 200  150 - 400 K/uL   Neutrophils Relative % 62  43 - 77 %   Neutro Abs 4.2  1.7 - 7.7 K/uL   Lymphocytes Relative 24  12 - 46 %   Lymphs Abs 1.6  0.7 - 4.0 K/uL   Monocytes Relative 8  3 - 12 %   Monocytes Absolute 0.5  0.1 - 1.0 K/uL   Eosinophils Relative 6 (*) 0 - 5 %   Eosinophils Absolute 0.4  0.0 - 0.7 K/uL   Basophils Relative 1  0 - 1 %   Basophils Absolute 0.1  0.0 - 0.1 K/uL    Dg Chest Port 1 View  09/16/2013   CLINICAL DATA:  Trach placement  EXAM: PORTABLE CHEST - 1 VIEW  COMPARISON:  08/25/2013  FINDINGS: Tracheostomy tip in the mid trachea at approximately T3. This is roughly 5.4 cm above the carina. Stable cardiomegaly with vascular congestion. No focal pneumonia, collapse or consolidation. No  effusion or pneumothorax.  IMPRESSION: Tracheostomy in the mid trachea.  Cardiomegaly with vascular congestion   Electronically Signed   By: Daryll Brod M.D.   On: 09/16/2013 18:21    Review of Systems  Constitutional: Negative.   HENT: Negative.   Eyes: Negative.   Respiratory: Positive for cough and shortness of breath. Negative for hemoptysis, sputum production and  wheezing.   Cardiovascular: Negative.   Gastrointestinal: Negative.   Genitourinary: Negative.   Musculoskeletal: Negative.   Skin: Negative.   Neurological: Negative.   Endo/Heme/Allergies: Negative.   Psychiatric/Behavioral: Negative.    Blood pressure 147/78, pulse 92, temperature 98.7 F (37.1 C), temperature source Oral, resp. rate 21, height 5\' 8"  (1.727 m), weight 156.1 kg (344 lb 2.2 oz), SpO2 97.00%. Physical Exam  Nursing note and vitals reviewed. Constitutional: He is oriented to person, place, and time. He appears well-developed. No distress.  Morbidly obese middle aged man  HENT:  Head: Normocephalic and atraumatic.  Mouth/Throat: Oropharynx is clear and moist.  Eyes: Conjunctivae are normal. Pupils are equal, round, and reactive to light. No scleral icterus.  Neck: No JVD present.  Tracheostomy in-situ  Cardiovascular: Normal rate, regular rhythm and normal heart sounds.  Exam reveals no friction rub.   No murmur heard. Respiratory: Effort normal. No respiratory distress. He has no wheezes. He has no rales.  transmitted sounds bilaterally  GI: Soft. Bowel sounds are normal. He exhibits no distension. There is no tenderness. There is no rebound and no guarding.  Musculoskeletal: Normal range of motion. He exhibits no edema and no tenderness.  Neurological: He is alert and oriented to person, place, and time. No cranial nerve deficit. Coordination normal.  Skin: Skin is warm and dry. No rash noted. No erythema.  Psychiatric: He has a normal mood and affect. His behavior is normal.    Assessment/Plan: 1. Malfunctioning tracheostomy with worsening of hypercarbic respiratory failure-improved rapidly with exchange of tracheostomy. He is currently saturating well on room air and speaking/performing activities without any obvious shortness of breath. He appears capable of self tracheostomy care. With regards to his morbid obesity/OSA-there is currently a plan in place to try  and get him lose weight and eventually wean him off tracheostomy. 2. End-stage renal disease: Will try a short run of hemodialysis today and a conventional hemodialysis tomorrow in order to try and gauge his safety at dialysis. This will also help [place] assess how much active participation in tracheostomy care is required by the dialysis nurses. 3. Anemia of chronic kidney disease: Restart ESA therapy, check iron stores. 4. Metabolic bone disease: We'll check a phosphorus level (add on to previously drawn labs) and adjust binders as needed. Review vitamin D receptor analogs dosing. 5. Hypertension: Blood pressures fairly controlled on the current regime-we'll attempt challenging dry weight.  Seth [name] K. 09/17/2013, 12:23 PM

## 2013-09-17 NOTE — Progress Notes (Signed)
No distress. No complaints  Exam normal except obesity Trach tube functioning  Labs reviewed  I do not think he should be required to go back to [facility] for the sole purpose of undergoing HD  The presence of a trach tube does not, in any way, increase any risks of outpt hemodialysis. This trach is in place solely for treatment of OSA.   I have strongly encouraged Renal Service to find a solution to this problem  The PCCM service has offered to provide any education to the dialysis staff if needed   Merton Border, MD ; [facility] [phone].  After 5:30 PM or weekends, call [phone]

## 2013-09-17 NOTE — Progress Notes (Signed)
Patient instructed on how to care for his trach.  Supplies at bedside.  Handouts on trach care given to patient. All questions answered. Will continue to monitor.  Dorothey Baseman, RN

## 2013-09-17 NOTE — Discharge Summary (Signed)
Physician Discharge Summary  [name] MRN: [id number] DOB/AGE: 1966-08-24 47 y.o.  Admit date: 09/16/2013 Discharge date: 09/19/2013    Discharge Diagnoses:  Tracheostomy Malfunction OSA Difficult Airway Morbid Obesity ESRD Anemia of Chronic Kidney Disease HTN                                                                       DISCHARGE PLAN BY DIAGNOSIS     Tracheostomy Malfunction Tracheostomy Wound OSA Difficult Airway Morbid Obesity  Discharge Plan: -#6 XLT trach - patient needs proximal length due to body habitus. Would not reduce size further.  -have partnered with patient that if he can lose weight to 275 lbs, we will de-cannulate and then return him to bipap for OSA  -Trach follow up with Dr. Redmond Baseman in January for change.  Last change to 6 XLT in ICU on 12/8 over bronchoscopy.   -Pt is going to [employer] for outpt assistance exercise program -Home PT to give pt exercise program for home -Aquacel to absorb drainage and provide antimicrobial benefits. Foam dressing to protect site and reduce pressure.  Change QD & PRN.   -follow up with Pulmonary at end of Feb / first of March for OSA - have called office for follow up call / appt with Dr. Halford Chessman for sleep eval -daily trach care and PRN with inner cannula change -arranged for home suction, trach supplies etc if needed.  Currently, he is not utilizing suction.   ESRD Anemia of Chronic Kidney Disease  Discharge Plan: -outpatient HD at [facility]: he will be temporarily dialyzed on T/Th/S schedule until center can be trach trained and then switched back to his M/W/F schedule at 3PM -patient is willing to come to HD on Sat 12/13.  He will report on Friday to do preliminary paperwork.   HTN  Discharge Plan: -continue norvasc  -ASA                   DISCHARGE SUMMARY   Seth [name] is a 47 y.o. y/o male with a PMH of DM, HTN, CKD, Remote Retinal Detachement with  recent admit (08/2013) for respiratory failure requiring emergent placement of bedside trach / revision per ENT.  Patient was discharged to [facility] for rehab and HD.  He was making great progress with physical activity.  At [facility], they had been capping his tracheostomy and putting him on bipap at night.  With tracheostomy status, he does not need nocturnal bipap.  If ever de-cannulated, he will need nocturnal bipap.  Seth [name] was noted to have a malfunctioning tracheostomy after it was changed to a #4 portex (short).  They were unable to pass suction cath and no air movement from trach. Patient was up on am of admit, ambulatory, doing yoga. He was transferred to [facility] for further evaluation.    At [facility] tracheostomy was evaluated and unable to pass suction catheter.  A bronchoscopy was placed into trach and noted excess tissue at end of tracheostomy obstructing airway.  Patient was not in distress and breathing without distress.  A #6 XLT was placed over tracheostomy and into airway without difficulty.   The regular tracheostomy was simply not long enough to  accommodate his anatomy. Due to his dialysis status, discharge was prolonged in order to find suitable placement for outpatient hemodialysis.  He will not be able to return to his [place] area HD center due to tracheostomy status.  He is agreeable to drive to [facility] for HD.    Patient was noted to have small clean wound under lower portion of tracheostomy phalange.  He was evaluated by [facility] for home wound care recommendations.   See plan as above.    SIGNIFICANT EVENTS / STUDIES:  12/08 - Admit with malfunctioning trach. Trach changed over bronch to #6 XLT   LINES / TUBES:  Trach Change #6 XLT 12/8>>>   CONSULTS Nephrology - Dr. Posey Pronto  Discharge Exam: General: Obese male in NAD  Neuro: AAOx4, speech clear, MAE  HEENT: Mm pink/moist, short thick neck, #6 proximal XL trach in place, capped c/d/i Cardiovascular: s1s2 rrr, no m/r/g   Lungs: resp's even/non-labored, lungs bilaterally clear  Abdomen: Obese/soft, bsx4 active  Musculoskeletal: No acute deformities  Skin: Warm/dry, no edema    Filed Vitals:   09/19/13 0010 09/19/13 0412 09/19/13 0500 09/19/13 0900  BP:   137/76 141/73  Pulse: 90 88 94 91  Temp:   98 F (36.7 C) 97.6 F (36.4 C)  TempSrc:   Oral Oral  Resp: 16 16 18    Height:      Weight:      SpO2: 98% 98% 96%    Discharge Labs  BMET  Recent Labs Lab 09/16/13 1841 09/18/13 0811  NA 131* 135  K 4.5 4.2  CL 92* 93*  CO2 29 29  GLUCOSE 107* 114*  BUN 34* 26*  CREATININE 10.35* 9.56*  CALCIUM 8.4 9.3  PHOS  --  3.3   CBC  Recent Labs Lab 09/16/13 1841 09/18/13 0811  HGB 8.6* 9.0*  HCT 27.0* 28.3*  WBC 6.8 6.8  PLT 200 225   Anti-Coagulation  Recent Labs Lab 09/16/13 1841  INR 0.93       Follow-up Information   Follow up with [employer]. (Present to fill out an application for membership.  They will work with you on potential scholarship for dues.  )    Contact information:   Ask to speak with Sampson Goon - Director       Follow up with [name]     On 10/14/2013. (Appt at 5 PM)    Contact information:   [address] [place] [address] [phone]      Follow up with Melida Quitter, MD On 11/01/2013. (Appt at 2:20 PM.  Please bring new trach with you to appointment.  #6 XLT uncuffed. )    Specialty:  Otolaryngology   Contact information:   [address] [place] [address] [phone]       Follow up with Chesley Mires, MD. (Office should call you at the end of February to be seen in first of March.  If you do not hear from them, please call to be seen regarding sleep apnea. )    Specialty:  Pulmonary Disease   Contact information:   [address] [place] [address] [phone]         Medication List    STOP taking these medications       feeding supplement (NEPRO CARB  STEADY) Liqd     heparin 5000 UNIT/ML injection     pantoprazole 40 MG tablet  Commonly known as:  PROTONIX  TAKE these medications       amLODipine 10 MG tablet  Commonly known as:  NORVASC  Take 1 tablet (10 mg total) by mouth daily.     AQUACEL EXTRA HYDROFIBER 2"X2" Pads  Apply 1 application topically daily as needed.     aspirin EC 81 MG tablet  Take 81 mg by mouth daily.     calcium acetate 667 MG capsule  Commonly known as:  PHOSLO  Take 2 capsules (1,334 mg total) by mouth 3 (three) times daily with meals.     darbepoetin 40 MCG/0.4ML Soln injection  Commonly known as:  ARANESP  Inject 0.4 mLs (40 mcg total) into the skin every Wednesday. **GIVEN AT HEMODIALYSIS**     fluticasone 50 MCG/ACT nasal spray  Commonly known as:  FLONASE  Place 2 sprays into both nostrils daily.     Foam Dressing Bordered Pads  Apply 1 each topically daily.     insulin aspart 100 UNIT/ML injection  Commonly known as:  novoLOG  - Inject 0-15 Units into the skin 3 (three) times daily with meals. CBG < 70: drink orange juice and recheck glucose or use glucose tabs  - CBG > 400: call MD   -   - CBG 70 - 120: 0 units  - CBG 121 - 150: 3 units  - CBG 151 - 200: 4 units  - CBG 201 - 250: 7 units  - CBG 251 - 300: 11 units  - CBG 301 - 350: 15 units  - CBG 351 - 400: 20 units     insulin glargine 100 UNIT/ML injection  Commonly known as:  LANTUS  Inject 0.15 mLs (15 Units total) into the skin at bedtime.     tamsulosin 0.4 MG Caps capsule  Commonly known as:  FLOMAX  Take 1 capsule (0.4 mg total) by mouth daily.     Vitamin D (Ergocalciferol) 50000 UNITS Caps capsule  Commonly known as:  DRISDOL  Take 1 capsule (50,000 Units total) by mouth every 7 (seven) days. *takes on Friday*        Disposition:  Home.  Patient will be set up with home health RN for trach / wound care, RT, and PT for exercise regimen.    Discharged Condition: Seth [name] has met maximum  benefit of inpatient care and is medically stable and cleared for discharge.  Patient is pending follow up as above.      Time spent on disposition:  Greater than 35 minutes.   Signed: Noe Gens, NP-C [facility] Pgr: [phone] Office: [phone]  Independently examined pt, evaluated data & formulated above discharge care plan with NP who scribed this note & edited by me.  Jamielee Mchale V.

## 2013-09-17 NOTE — Progress Notes (Signed)
UR Completed.  Seth [name] [id number] 09/17/2013

## 2013-09-18 ENCOUNTER — Encounter: Payer: Self-pay | Admitting: Pulmonary Disease

## 2013-09-18 DIAGNOSIS — I120 Hypertensive chronic kidney disease with stage 5 chronic kidney disease or end stage renal disease: Secondary | ICD-10-CM | POA: Diagnosis not present

## 2013-09-18 DIAGNOSIS — J9620 Acute and chronic respiratory failure, unspecified whether with hypoxia or hypercapnia: Secondary | ICD-10-CM | POA: Diagnosis not present

## 2013-09-18 DIAGNOSIS — N186 End stage renal disease: Secondary | ICD-10-CM

## 2013-09-18 DIAGNOSIS — Z930 Tracheostomy status: Secondary | ICD-10-CM | POA: Diagnosis not present

## 2013-09-18 DIAGNOSIS — N2581 Secondary hyperparathyroidism of renal origin: Secondary | ICD-10-CM | POA: Diagnosis not present

## 2013-09-18 DIAGNOSIS — J9503 Malfunction of tracheostomy stoma: Secondary | ICD-10-CM | POA: Diagnosis not present

## 2013-09-18 DIAGNOSIS — D631 Anemia in chronic kidney disease: Secondary | ICD-10-CM | POA: Diagnosis not present

## 2013-09-18 DIAGNOSIS — J9600 Acute respiratory failure, unspecified whether with hypoxia or hypercapnia: Secondary | ICD-10-CM | POA: Diagnosis not present

## 2013-09-18 LAB — RENAL FUNCTION PANEL
Albumin: 3.5 g/dL (ref 3.5–5.2)
Calcium: 9.3 mg/dL (ref 8.4–10.5)
Creatinine, Ser: 9.56 mg/dL — ABNORMAL HIGH (ref 0.50–1.35)
GFR calc Af Amer: 7 mL/min — ABNORMAL LOW (ref >90)
GFR calc non Af Amer: 6 mL/min — ABNORMAL LOW (ref >90)
Phosphorus: 3.3 mg/dL (ref 2.3–4.6)

## 2013-09-18 LAB — CBC
MCH: 30.3 pg (ref 26.0–34.0)
MCV: 95.3 fL (ref 78.0–100.0)
Platelets: 225 10*3/uL (ref 150–400)
RDW: 12.8 % (ref 11.5–15.5)

## 2013-09-18 LAB — GLUCOSE, CAPILLARY
Glucose-Capillary: 124 mg/dL — ABNORMAL HIGH (ref 70–99)
Glucose-Capillary: 209 mg/dL — ABNORMAL HIGH (ref 70–99)

## 2013-09-18 MED ORDER — DARBEPOETIN ALFA-POLYSORBATE 40 MCG/0.4ML IJ SOLN: 40.0000 ug | INTRAMUSCULAR | Status: DC

## 2013-09-18 MED ORDER — SODIUM CHLORIDE 0.9 % IV SOLN: 100.0000 mL | INTRAVENOUS | Status: DC | PRN

## 2013-09-18 MED ORDER — LIDOCAINE-PRILOCAINE 2.5-2.5 % EX CREA: 1.0000 "application " | TOPICAL_CREAM | CUTANEOUS | Status: DC | PRN

## 2013-09-18 MED ORDER — LIDOCAINE HCL (PF) 1 % IJ SOLN: 5.0000 mL | INTRAMUSCULAR | Status: DC | PRN

## 2013-09-18 MED ORDER — INSULIN ASPART 100 UNIT/ML ~~LOC~~ SOLN: 0.0000 [IU] | Freq: Three times a day (TID) | SUBCUTANEOUS | Status: DC

## 2013-09-18 MED ORDER — "AQUACEL EXTRA HYDROFIBER 2""X2"" EX PADS": 1.0000 "application " | MEDICATED_PAD | Freq: Every day | CUTANEOUS | Status: DC | PRN

## 2013-09-18 MED ORDER — HEPARIN SODIUM (PORCINE) 1000 UNIT/ML DIALYSIS
5000.0000 [IU] | INTRAMUSCULAR | Status: DC | PRN
  Administered 2013-09-18: 5000 [IU] via INTRAVENOUS_CENTRAL
  Filled 2013-09-18: qty 5

## 2013-09-18 MED ORDER — ALTEPLASE 2 MG IJ SOLR
2.0000 mg | Freq: Once | INTRAMUSCULAR | Status: DC | PRN
  Filled 2013-09-18: qty 2

## 2013-09-18 MED ORDER — PENTAFLUOROPROP-TETRAFLUOROETH EX AERO: 1.0000 "application " | INHALATION_SPRAY | CUTANEOUS | Status: DC | PRN

## 2013-09-18 MED ORDER — TUBERCULIN PPD 5 UNIT/0.1ML ID SOLN
5.0000 [IU] | Freq: Once | INTRADERMAL | Status: DC
  Administered 2013-09-18: 5 [IU] via INTRADERMAL
  Filled 2013-09-18 (×2): qty 0.1

## 2013-09-18 MED ORDER — NEPRO/CARBSTEADY PO LIQD
237.0000 mL | ORAL | Status: DC | PRN
  Filled 2013-09-18: qty 237

## 2013-09-18 MED ORDER — FOAM DRESSING BORDERED EX PADS: 1.0000 | MEDICATED_PAD | Freq: Every day | CUTANEOUS | Status: DC

## 2013-09-18 MED ORDER — HEPARIN SODIUM (PORCINE) 1000 UNIT/ML DIALYSIS: 1000.0000 [IU] | INTRAMUSCULAR | Status: DC | PRN

## 2013-09-18 NOTE — Progress Notes (Signed)
Placed pt. On CPAP auto titrate Min: 6, max: 20 via nasal mask. Pt. Is tolerating CPAP well at this time without any complications.

## 2013-09-18 NOTE — Progress Notes (Signed)
PULMONARY  / CRITICAL CARE MEDICINE  Name: Seth [name] MRN: [id number] DOB: 04-Jun-1966    ADMISSION DATE:  09/16/2013  REFERRING MD :  [facility] PRIMARY SERVICE:  PCCM  CHIEF COMPLAINT:  Tracheostomy Malfunction  BRIEF PATIENT DESCRIPTION: 47 y/o M with recent admit for respiratory failure requiring emergent placement of bedside trach / revision per ENT.  Returns post trach change at [facility] with obstructed trach.    SIGNIFICANT EVENTS / STUDIES:  12/08 - Admit with malfunctioning trach.  Trach changed over bronch to #6 XLT  LINES / TUBES: Trach Change #6 XLT 12/8>>>   SUBJECTIVE: Pt denies acute c/o's.  No distress. Examined on HD Frustrated by lack of progress with HD plans  VITAL SIGNS: Temp:  [97.2 F (36.2 C)-98.2 F (36.8 C)] 97.8 F (36.6 C) (12/10 1313) Pulse Rate:  [83-102] 102 (12/10 1313) Resp:  [18-20] 20 (12/10 1313) BP: (108-170)/(27-88) 115/81 mmHg (12/10 1313) SpO2:  [94 %-100 %] 95 % (12/10 1313) Weight:  [333 lb 1.8 oz (151.1 kg)-339 lb 4.6 oz (153.9 kg)] 333 lb 1.8 oz (151.1 kg) (12/10 1215)  PHYSICAL EXAMINATION: General:  Obese male in NAD Neuro:  AAOx4, speech clear, MAE HEENT:  Mm pink/moist, short thick neck, #6 proximal XL trach c/d/i Cardiovascular:  s1s2 rrr, no m/r/g Lungs:  resp's even/non-labored, lungs bilaterally clear Abdomen:  Obese/soft, bsx4 active Musculoskeletal:  No acute deformities Skin:  Warm/dry, no edema   Recent Labs Lab 09/16/13 1841 09/18/13 0811  NA 131* 135  K 4.5 4.2  CL 92* 93*  CO2 29 29  BUN 34* 26*  CREATININE 10.35* 9.56*  GLUCOSE 107* 114*    Recent Labs Lab 09/16/13 1841 09/18/13 0811  HGB 8.6* 9.0*  HCT 27.0* 28.3*  WBC 6.8 6.8  PLT 200 225   Dg Chest Port 1 View  09/16/2013   CLINICAL DATA:  Trach placement  EXAM: PORTABLE CHEST - 1 VIEW  COMPARISON:  08/25/2013  FINDINGS: Tracheostomy tip in the mid trachea at approximately T3. This is roughly 5.4 cm above the carina. Stable cardiomegaly  with vascular congestion. No focal pneumonia, collapse or consolidation. No effusion or pneumothorax.  IMPRESSION: Tracheostomy in the mid trachea.  Cardiomegaly with vascular congestion   Electronically Signed   By: Daryll Brod M.D.   On: 09/16/2013 18:21    ASSESSMENT / PLAN:  Tracheostomy Malfunction - history of extremely difficult airway.  S/P emergent trach placement in 08/2013.  Revision per ENT.   OSA  Plan: -#6 XLT trach - patient needs proximal length due to body habitus.  Would not reduce size further.   - if loses weight to 275 lbs, can consider de-cannulation and then return him to bipap for OSA -wound consult for trach site home care regimen -attempting to arrange for PCP with the [facility] [phone])  -plan for trach clinic follow up -leave uncapped during sleep/ naps   Morbid Obesity   Plan: -have called [employer] and awaiting call for outpt assistance exercise -arrange for home PT to give pt exercise program for home   ESRD  Plan: -renal consult.  Have discussed case with Dr. Clover Mealy regarding outpatient need for HD.   outpatient HD placement with tracheostomy status is difficult.     HTN  Plan: -continue norvasc -ASA   Transfer to Medical Floor.  Hopeful for discharge to home after outpatient HD placement   Kara Mead MD. FCCP. [facility] Pager [phone] If  no response call [phone]    09/18/2013, 5:27 PM

## 2013-09-18 NOTE — Progress Notes (Signed)
SLP Cancellation Note  Patient Details Name: RUTH ENGELHARD MRN: [id number] DOB: 07/12/1966   Cancelled eval:    Demonstrated to pt how to occlude trach with finger to achieve voice; no PMSV needed.  Will sign off.        Juan Quam Laurice 09/18/2013, 3:08 PM

## 2013-09-18 NOTE — Care Management Note (Signed)
   CARE MANAGEMENT NOTE 09/18/2013  Patient:  Seth [name], Seth [name]   Account Number:  [id number]  Date Initiated:  09/17/2013  Documentation initiated by:  [facility]  Subjective/Objective Assessment:   Readmission from [facility] with malfunctioning trach.  Placed on bipap - hypercapnea     Action/Plan:   Pt will need outpatient HD at a center that will accept pt with trachs.   Anticipated DC Date:     Anticipated DC Plan:  [place]  CM consult      Choice offered to / List presented to:     DME arranged  [place]      DME agency  [place] arranged  HH-1 RN  HH-10 DISEASE MANAGEMENT  HH-2 PT  [place].   Status of service:  In process, will continue to follow Medicare Important Message given?   (If response is "NO", the following Medicare IM given date fields will be blank) Date Medicare IM given:   Date Additional Medicare IM given:    Discharge Disposition:    Per UR Regulation:  Reviewed for med. necessity/level of care/duration of stay  If discussed at [place] of Stay Meetings, dates discussed:    Comments:    09/18/2013 This CM contacted [facility] and was informed that they are able to provide hemodialysis for pt with a trach. Application faxed , this CM completed and with assist of unit staff and other CM A Roland was able to complete the application and fax to [facility] @ approximately 1345. [facility] repre called and confirmed that info had been received and forwarded to their medical director. This case manger called again approx [phone] and was informed that the medical director had not responded and this if approved the center would need to restock trach supplies and review procedures with the nursing staff in preparation for this pt. They would not be able to start serving this pt until early next week.  Brandy PA notified and she will discuss with pt.               Seth Pang RN MPH, case manager, [phone]  09-17-13 12:10pm Luz Lex, RNBSN [phone] Plan for patient to go home with [facility] from [facility] per choice after dialysis tomorrow and once confirmed that dialysis center will take trach.  Referral called to [facility].  Lurline Idol is plugged during day and unplugged at night.  Does not need home oxygen.  Talked with respiratory therapy about making sure pateint has had and is able to care for trach at home. Will also be sending home with him for emergency purposes - home suction and a smaller size trach. Appt made at [facility] for followup on Jan 5th at 4pm. - On discharge summary. Respiratory confirmed that they have gone over trach care and he is good for discharge.

## 2013-09-18 NOTE — Progress Notes (Signed)
[name], male   DOB: 08/05/1966, 47 y.o.   MRN: [id number]  [facility] Progress Note   Assessment/ Plan:   1. Malfunctioning tracheostomy with worsening of hypercarbic respiratory failure-improved rapidly with exchange of tracheostomy. Doing well on HD with limited trach care needed. Unfortunately, cannot be placed at any local [facility] units and will need help from SW (informed) for placement to [facility] (the unit to my knowledge with that has the ability for trach care)  2. End-stage renal disease: Continue MWF HD  3. Anemia of chronic kidney disease: Restarted ESA therapy, will check iron stores.  4. Metabolic bone disease: Phosphorus at goal and VDRA restarted- continue on current binder dose.  5. Hypertension: Blood pressures fairly controlled on the current regime-we'll attempt challenging dry weight.  Subjective:   Reports to be feeling well and "ready to go home as soon as possible"   Objective:   BP 108/55  Pulse 102  Temp(Src) 98.2 F (36.8 C) (Oral)  Resp 20  Ht 5\' 8"  (1.727 m)  Wt 153.9 kg (339 lb 4.6 oz)  BMI 51.60 kg/m2  SpO2 97%  Physical Exam: [id number] resting on recliner at dialysis-not on supplemental oxygen or requiring any trach care. [id number] RRR, Normal S1 with loud S2 Resp:CTA bilaterally, no rales/rhonchi [id number], obese, non-tender, BS normal Ext:No LE edema.  Labs: BMET  Recent Labs Lab 09/16/13 1841 09/18/13 0811  NA 131* 135  K 4.5 4.2  CL 92* 93*  CO2 29 29  GLUCOSE 107* 114*  BUN 34* 26*  CREATININE 10.35* 9.56*  CALCIUM 8.4 9.3  PHOS  --  3.3   CBC  Recent Labs Lab 09/16/13 1841 09/18/13 0811  WBC 6.8 6.8  NEUTROABS 4.2  --   HGB 8.6* 9.0*  HCT 27.0* 28.3*  MCV 95.1 95.3  PLT 200 225   Medications:    . amLODipine  10 mg Oral Daily  . aspirin EC  81 mg Oral Daily  . calcium acetate  1,334 mg Oral TID WC  . fluticasone  2 spray Each Nare Daily  . insulin aspart  0-20 Units  Subcutaneous TID WC  . insulin glargine  15 Units Subcutaneous QHS  . tamsulosin  0.4 mg Oral Daily   Elmarie Shiley, MD 09/18/2013, 10:07 AM

## 2013-09-19 DIAGNOSIS — D631 Anemia in chronic kidney disease: Secondary | ICD-10-CM | POA: Diagnosis not present

## 2013-09-19 DIAGNOSIS — I120 Hypertensive chronic kidney disease with stage 5 chronic kidney disease or end stage renal disease: Secondary | ICD-10-CM | POA: Diagnosis not present

## 2013-09-19 DIAGNOSIS — J9600 Acute respiratory failure, unspecified whether with hypoxia or hypercapnia: Secondary | ICD-10-CM | POA: Diagnosis not present

## 2013-09-19 DIAGNOSIS — J9620 Acute and chronic respiratory failure, unspecified whether with hypoxia or hypercapnia: Secondary | ICD-10-CM | POA: Diagnosis not present

## 2013-09-19 DIAGNOSIS — N186 End stage renal disease: Secondary | ICD-10-CM | POA: Diagnosis not present

## 2013-09-19 DIAGNOSIS — Z930 Tracheostomy status: Secondary | ICD-10-CM | POA: Diagnosis not present

## 2013-09-19 MED ORDER — INSULIN ASPART 100 UNIT/ML ~~LOC~~ SOLN: 0.0000 [IU] | Freq: Three times a day (TID) | SUBCUTANEOUS | Status: DC

## 2013-09-19 MED ORDER — VITAMIN D (ERGOCALCIFEROL) 1.25 MG (50000 UNIT) PO CAPS: 50000.0000 [IU] | ORAL_CAPSULE | ORAL | Status: DC

## 2013-09-19 MED ORDER — FLUTICASONE PROPIONATE 50 MCG/ACT NA SUSP: 2.0000 | Freq: Every day | NASAL | Status: DC

## 2013-09-19 MED ORDER — CALCIUM ACETATE 667 MG PO CAPS: 1334.0000 mg | ORAL_CAPSULE | Freq: Three times a day (TID) | ORAL | Status: DC

## 2013-09-19 MED ORDER — AMLODIPINE BESYLATE 10 MG PO TABS: 10.0000 mg | ORAL_TABLET | Freq: Every day | ORAL | Status: DC

## 2013-09-19 MED ORDER — TAMSULOSIN HCL 0.4 MG PO CAPS: 0.4000 mg | ORAL_CAPSULE | Freq: Every day | ORAL | Status: DC

## 2013-09-19 MED ORDER — ERGOCALCIFEROL 1.25 MG (50000 UT) PO CAPS: 50000.0000 [IU] | ORAL_CAPSULE | ORAL | Status: DC

## 2013-09-19 MED ORDER — INSULIN GLARGINE 100 UNIT/ML ~~LOC~~ SOLN: 15.0000 [IU] | Freq: Every day | SUBCUTANEOUS | Status: DC

## 2013-09-19 NOTE — Progress Notes (Signed)
Pt with multiple episodes of desaturation to 82-84% with trach uncapped while sleeping. Pt placed on the c-pap at bedside and O2 saturations remain at 98% while sleeping. Assessed pt to try again sleeping uncapped on RA again and pt requested to leave on. Dorthey Sawyer, RN

## 2013-09-19 NOTE — Progress Notes (Signed)
Pt. States that MD told him that if he took the cap off of his trach tonight he didn't have to wear CPAP. Pt. Stated that he wanted to take the cap off & not wear CPAP tonight. RN is aware. CPAP is in pt.'s room on standby.

## 2013-09-19 NOTE — Progress Notes (Signed)
Patient discharge Home per Md order.  Discharge instructions reviewed with patient and family.  Copies of all forms given and explained. Patient/family voiced understanding of all instructions.  Discharge in no acute distress. Adelene Idler, RN [facility], BSN, MSN

## 2013-09-19 NOTE — Progress Notes (Signed)
PULMONARY  / CRITICAL CARE MEDICINE  Name: Seth [name] MRN: [id number] DOB: [birth year]    ADMISSION DATE:  09/16/2013  REFERRING MD :  [facility] PRIMARY SERVICE:  PCCM  CHIEF COMPLAINT:  Tracheostomy Malfunction  BRIEF PATIENT DESCRIPTION: 47 y/o M with recent admit for respiratory failure requiring emergent placement of bedside trach / revision per ENT.  Returns post trach change at [facility] with obstructed trach.    SIGNIFICANT EVENTS / STUDIES:  12/08 - Admit with malfunctioning trach.  Trach changed over bronch to #6 XLT  LINES / TUBES: Trach Change #6 XLT 12/8>>>   SUBJECTIVE: Pt denies acute c/o's.  No distress. Examined on HD Frustrated by lack of progress with HD plans  VITAL SIGNS: Temp:  [97.6 F (36.4 C)-98.2 F (36.8 C)] 97.6 F (36.4 C) (12/11 0900) Pulse Rate:  [79-102] 91 (12/11 0900) Resp:  [16-20] 18 (12/11 0500) BP: (115-154)/(53-83) 141/73 mmHg (12/11 0900) SpO2:  [95 %-99 %] 96 % (12/11 0500) Weight:  [333 lb 1.8 oz (151.1 kg)] 333 lb 1.8 oz (151.1 kg) (12/10 1215)  PHYSICAL EXAMINATION: General:  Obese male in NAD Neuro:  AAOx4, speech clear, MAE HEENT:  Mm pink/moist, short thick neck, #6 proximal XL trach c/d/i Cardiovascular:  s1s2 rrr, no m/r/g Lungs:  resp's even/non-labored, lungs bilaterally clear Abdomen:  Obese/soft, bsx4 active Musculoskeletal:  No acute deformities Skin:  Warm/dry, no edema   Recent Labs Lab 09/16/13 1841 09/18/13 0811  NA 131* 135  K 4.5 4.2  CL 92* 93*  CO2 29 29  BUN 34* 26*  CREATININE 10.35* 9.56*  GLUCOSE 107* 114*    Recent Labs Lab 09/16/13 1841 09/18/13 0811  HGB 8.6* 9.0*  HCT 27.0* 28.3*  WBC 6.8 6.8  PLT 200 225   No results found.  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  S/P emergent trach placement in 08/2013.  Revision per ENT.   OSA  Plan: -#6 XLT trach - patient needs proximal length due to body habitus.  Would not reduce size  further.   - if loses weight to 275 lbs, can consider de-cannulation and then return him to bipap for OSA -wound consult for trach site home care regimen -attempting to arrange for PCP with the [place] [phone])  -plan for trach clinic follow up -leave uncapped during sleep/ naps   Morbid Obesity   Plan: -have called [facility] and awaiting call for outpt assistance exercise -arrange for home PT to give pt exercise program for home   ESRD  Plan: -renal consult.    outpatient HD placement with tracheostomy status is difficult - ongoing efforts    HTN  Plan: -continue norvasc -ASA   Transfer to Medical Floor.  Hopeful for discharge to home after outpatient HD placement . Await arrangements.  Kara Mead MD. Shade Flood. [facility] Pager [phone] If no response call [phone]    09/19/2013, 10:18 AM

## 2013-09-19 NOTE — Progress Notes (Signed)
[name], male   DOB: [birth year], 47 y.o.   MRN: [id number]   [facility] Progress Note   Assessment/ Plan:   1. Malfunctioning tracheostomy with worsening of hypercarbic respiratory failure-improved rapidly with exchange of tracheostomy. Minimal tracheostomy care needed since exchange done. 2. End-stage renal disease: Continue MWF HD -it appears that it may be accepted to the [facility] unit capable of tracheostomy care if needed. Paperwork pending acceptance by Market researcher at that facility. 3. Anemia of chronic kidney disease: Restarted ESA therapy, no overt losses-continue to monitor 4. Metabolic bone disease: Phosphorus at goal and VDRA restarted- continue on current binder dose.  5. Hypertension: Blood pressures fairly controlled on the current regime-we'll attempt challenging dry weight.   Subjective:   Reports to be feeling well, anxious for discharge home    Objective:   BP 141/73  Pulse 91  Temp(Src) 97.6 F (36.4 C) (Oral)  Resp 18  Ht 5\' 8"  (1.727 m)  Wt 151.1 kg (333 lb 1.8 oz)  BMI 50.66 kg/m2  SpO2 96%  Physical Exam: Gen: Comfortably sitting up in his recliner, reading Bible CVS: Pulse regular in rate and rhythm, S1 and S2 normal Resp: Transmitted breath sounds bilaterally-no wheeze or rales Abd: Soft, obese, nontender and bowel sounds are normal Ext: No lower extremity edema  Labs: BMET  Recent Labs Lab 09/16/13 1841 09/18/13 0811  NA 131* 135  K 4.5 4.2  CL 92* 93*  CO2 29 29  GLUCOSE 107* 114*  BUN 34* 26*  CREATININE 10.35* 9.56*  CALCIUM 8.4 9.3  PHOS  --  3.3   CBC  Recent Labs Lab 09/16/13 1841 09/18/13 0811  WBC 6.8 6.8  NEUTROABS 4.2  --   HGB 8.6* 9.0*  HCT 27.0* 28.3*  MCV 95.1 95.3  PLT 200 225   Medications:    . amLODipine  10 mg Oral Daily  . aspirin EC  81 mg Oral Daily  . calcium acetate  1,334 mg Oral TID WC  . fluticasone  2 spray Each Nare Daily  . insulin aspart  0-20  Units Subcutaneous TID WC  . insulin glargine  15 Units Subcutaneous QHS  . tamsulosin  0.4 mg Oral Daily  . tuberculin  5 Units Intradermal Once   Elmarie Shiley, MD 09/19/2013, 11:25 AM

## 2013-09-20 DIAGNOSIS — Z930 Tracheostomy status: Secondary | ICD-10-CM | POA: Diagnosis not present

## 2013-09-20 DIAGNOSIS — N186 End stage renal disease: Secondary | ICD-10-CM | POA: Diagnosis not present

## 2013-09-20 DIAGNOSIS — Z992 Dependence on renal dialysis: Secondary | ICD-10-CM | POA: Diagnosis not present

## 2013-09-20 DIAGNOSIS — D631 Anemia in chronic kidney disease: Secondary | ICD-10-CM | POA: Diagnosis not present

## 2013-09-20 DIAGNOSIS — L8992 Pressure ulcer of unspecified site, stage 2: Secondary | ICD-10-CM | POA: Diagnosis not present

## 2013-09-20 DIAGNOSIS — Z794 Long term (current) use of insulin: Secondary | ICD-10-CM | POA: Diagnosis not present

## 2013-09-20 DIAGNOSIS — E1129 Type 2 diabetes mellitus with other diabetic kidney complication: Secondary | ICD-10-CM | POA: Diagnosis not present

## 2013-09-20 DIAGNOSIS — L89899 Pressure ulcer of other site, unspecified stage: Secondary | ICD-10-CM | POA: Diagnosis not present

## 2013-09-23 ENCOUNTER — Telehealth: Payer: Self-pay

## 2013-09-23 DIAGNOSIS — N186 End stage renal disease: Secondary | ICD-10-CM | POA: Diagnosis not present

## 2013-09-23 DIAGNOSIS — L89899 Pressure ulcer of other site, unspecified stage: Secondary | ICD-10-CM | POA: Diagnosis not present

## 2013-09-23 DIAGNOSIS — Z930 Tracheostomy status: Secondary | ICD-10-CM | POA: Diagnosis not present

## 2013-09-23 DIAGNOSIS — D631 Anemia in chronic kidney disease: Secondary | ICD-10-CM | POA: Diagnosis not present

## 2013-09-23 DIAGNOSIS — L8992 Pressure ulcer of unspecified site, stage 2: Secondary | ICD-10-CM | POA: Diagnosis not present

## 2013-09-23 DIAGNOSIS — E1129 Type 2 diabetes mellitus with other diabetic kidney complication: Secondary | ICD-10-CM | POA: Diagnosis not present

## 2013-09-23 NOTE — Telephone Encounter (Signed)
Noted  

## 2013-09-25 DIAGNOSIS — D631 Anemia in chronic kidney disease: Secondary | ICD-10-CM | POA: Diagnosis not present

## 2013-09-25 DIAGNOSIS — N186 End stage renal disease: Secondary | ICD-10-CM | POA: Diagnosis not present

## 2013-09-25 DIAGNOSIS — Z930 Tracheostomy status: Secondary | ICD-10-CM | POA: Diagnosis not present

## 2013-09-25 DIAGNOSIS — L89899 Pressure ulcer of other site, unspecified stage: Secondary | ICD-10-CM | POA: Diagnosis not present

## 2013-09-25 DIAGNOSIS — L8992 Pressure ulcer of unspecified site, stage 2: Secondary | ICD-10-CM | POA: Diagnosis not present

## 2013-09-25 DIAGNOSIS — E1129 Type 2 diabetes mellitus with other diabetic kidney complication: Secondary | ICD-10-CM | POA: Diagnosis not present

## 2013-10-02 DIAGNOSIS — E1129 Type 2 diabetes mellitus with other diabetic kidney complication: Secondary | ICD-10-CM | POA: Diagnosis not present

## 2013-10-02 DIAGNOSIS — Z930 Tracheostomy status: Secondary | ICD-10-CM | POA: Diagnosis not present

## 2013-10-02 DIAGNOSIS — N186 End stage renal disease: Secondary | ICD-10-CM | POA: Diagnosis not present

## 2013-10-02 DIAGNOSIS — L8992 Pressure ulcer of unspecified site, stage 2: Secondary | ICD-10-CM | POA: Diagnosis not present

## 2013-10-02 DIAGNOSIS — L89899 Pressure ulcer of other site, unspecified stage: Secondary | ICD-10-CM | POA: Diagnosis not present

## 2013-10-02 DIAGNOSIS — D631 Anemia in chronic kidney disease: Secondary | ICD-10-CM | POA: Diagnosis not present

## 2013-10-05 DIAGNOSIS — D631 Anemia in chronic kidney disease: Secondary | ICD-10-CM | POA: Diagnosis not present

## 2013-10-05 DIAGNOSIS — L89899 Pressure ulcer of other site, unspecified stage: Secondary | ICD-10-CM | POA: Diagnosis not present

## 2013-10-05 DIAGNOSIS — E1129 Type 2 diabetes mellitus with other diabetic kidney complication: Secondary | ICD-10-CM | POA: Diagnosis not present

## 2013-10-05 DIAGNOSIS — L8992 Pressure ulcer of unspecified site, stage 2: Secondary | ICD-10-CM | POA: Diagnosis not present

## 2013-10-05 DIAGNOSIS — Z930 Tracheostomy status: Secondary | ICD-10-CM | POA: Diagnosis not present

## 2013-10-05 DIAGNOSIS — N186 End stage renal disease: Secondary | ICD-10-CM | POA: Diagnosis not present

## 2013-10-07 ENCOUNTER — Telehealth: Payer: Self-pay

## 2013-10-07 ENCOUNTER — Telehealth: Payer: Self-pay | Admitting: Internal Medicine

## 2013-10-07 NOTE — Telephone Encounter (Signed)
Order to be placed to [facility] Order to be scanned to pt chart.

## 2013-10-07 NOTE — Telephone Encounter (Signed)
lmomtcb x1 for Seth [name] See phone note we spoke with Seth [name] from [facility] as well

## 2013-10-07 NOTE — Telephone Encounter (Signed)
Spoke with rhonda at [facility] Requesting an order to be placed for weekly trach assessments and wound care. Suanne Marker states Per Medicare guidelines, they requires wound checks and assessments on trach sites. Dr Titus Mould is out on vacation until next week and [facility] needs this order.   Order needs to state : Skill Nurse to assess trach site and give wound care weekly. Aquacel AG needs to be applied to wound site. Order to be faxed to [phone]  ATTN: Suanne Marker  Dr Melvyn Novas are you okay with signing this order?

## 2013-10-07 NOTE — Telephone Encounter (Signed)
Ok to send order. 

## 2013-10-08 DIAGNOSIS — E1129 Type 2 diabetes mellitus with other diabetic kidney complication: Secondary | ICD-10-CM | POA: Diagnosis not present

## 2013-10-08 DIAGNOSIS — D631 Anemia in chronic kidney disease: Secondary | ICD-10-CM | POA: Diagnosis not present

## 2013-10-08 DIAGNOSIS — Z930 Tracheostomy status: Secondary | ICD-10-CM | POA: Diagnosis not present

## 2013-10-08 DIAGNOSIS — L89899 Pressure ulcer of other site, unspecified stage: Secondary | ICD-10-CM | POA: Diagnosis not present

## 2013-10-08 DIAGNOSIS — N186 End stage renal disease: Secondary | ICD-10-CM | POA: Diagnosis not present

## 2013-10-08 DIAGNOSIS — L8992 Pressure ulcer of unspecified site, stage 2: Secondary | ICD-10-CM | POA: Diagnosis not present

## 2013-10-08 NOTE — Telephone Encounter (Signed)
ATC, someone answered but then call was disconnected. Called back, NA. Tom Green Bing, CMA

## 2013-10-10 DIAGNOSIS — N186 End stage renal disease: Secondary | ICD-10-CM | POA: Diagnosis not present

## 2013-10-11 NOTE — Telephone Encounter (Signed)
ATC # provided above - NA -- phone continued to ring. WCB

## 2013-10-14 ENCOUNTER — Ambulatory Visit: Payer: Medicare Other | Attending: Internal Medicine | Admitting: Internal Medicine

## 2013-10-14 ENCOUNTER — Encounter: Payer: Self-pay | Admitting: Internal Medicine

## 2013-10-14 VITALS — BP 140/70 | HR 84 | Temp 98.4°F | Resp 16 | Wt 342.8 lb

## 2013-10-14 DIAGNOSIS — I10 Essential (primary) hypertension: Secondary | ICD-10-CM | POA: Diagnosis not present

## 2013-10-14 DIAGNOSIS — Z139 Encounter for screening, unspecified: Secondary | ICD-10-CM

## 2013-10-14 DIAGNOSIS — Z992 Dependence on renal dialysis: Secondary | ICD-10-CM

## 2013-10-14 DIAGNOSIS — E119 Type 2 diabetes mellitus without complications: Secondary | ICD-10-CM | POA: Diagnosis not present

## 2013-10-14 DIAGNOSIS — I120 Hypertensive chronic kidney disease with stage 5 chronic kidney disease or end stage renal disease: Secondary | ICD-10-CM | POA: Insufficient documentation

## 2013-10-14 DIAGNOSIS — Z930 Tracheostomy status: Secondary | ICD-10-CM

## 2013-10-14 DIAGNOSIS — G4733 Obstructive sleep apnea (adult) (pediatric): Secondary | ICD-10-CM

## 2013-10-14 DIAGNOSIS — E559 Vitamin D deficiency, unspecified: Secondary | ICD-10-CM

## 2013-10-14 DIAGNOSIS — N186 End stage renal disease: Secondary | ICD-10-CM | POA: Insufficient documentation

## 2013-10-14 LAB — POCT GLYCOSYLATED HEMOGLOBIN (HGB A1C): Hemoglobin A1C: 7

## 2013-10-14 NOTE — Progress Notes (Signed)
Patient here to establish care History of DM Renal disease Respiratory failure Has a trach

## 2013-10-14 NOTE — Progress Notes (Signed)
MRN: [id number] Name: Seth [name]  Sex: male Age: 48 y.o. DOB: [birth year]  Allergies: Review of patient's allergies indicates no known allergies.  Chief Complaint  Patient presents with  . new patient    HPI: Patient is 48 y.o. male  Who has history of diabetes hypertension CKD. on hemodialysis comes today for first time to establish medical care, recently hospitalized, had respiratory failure requiring immediate bedside trach, EMR reviewed, had continued prolonged stay in the hospital, blood work reviewed, he is on dialysis 3 times a week, already has scheduled appointment to see ENT. Patient denies any acute symptoms denies any chest pain shortness of breath. Patient is compliant with his medications, his hemoglobin A1c today is 7%. Patient is on sliding scale and is on Lantus.  Past Medical History  Diagnosis Date  . Diabetes mellitus   . Hypertension   . Chronic kidney disease   . 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 Av fistula placement  05/03/2012    Procedure: ARTERIOVENOUS (AV) FISTULA CREATION;  Surgeon: Mal [name], MD;  Location: [place];  Service: Vascular;  Laterality: Right;  . Tracheostomy tube placement N/A 08/20/2013    Procedure: TRACHEOSTOMY Revision;  Surgeon: Melida Quitter, MD;  Location: [place];  Service: ENT;  Laterality: N/A;      Medication List       This list is accurate as of: 10/14/13  5:11 PM.  Always use your most recent med list.               amLODipine 10 MG tablet  Commonly known as:  NORVASC  Take 1 tablet (10 mg total) by mouth daily.     AQUACEL EXTRA HYDROFIBER 2"X2" Pads  Apply 1 application topically daily as needed.     aspirin EC 81 MG tablet  Take 81 mg by mouth daily.     calcium acetate 667 MG capsule  Commonly known as:  PHOSLO  Take 2 capsules (1,334 mg total) by mouth 3 (three) times daily with meals.     darbepoetin 40 MCG/0.4ML Soln injection  Commonly known as:  ARANESP  Inject 0.4 mLs (40 mcg total) into the skin every Wednesday. **GIVEN AT HEMODIALYSIS**     fluticasone 50 MCG/ACT nasal spray  Commonly known as:  FLONASE  Place 2 sprays into both nostrils daily.     Foam Dressing Bordered Pads  Apply 1 each topically daily.     insulin aspart 100 UNIT/ML injection  Commonly known as:  novoLOG  - Inject 0-15 Units into the skin 3 (three) times daily with meals. CBG < 70: drink orange juice and recheck glucose or use glucose tabs  - CBG > 400: call MD   -   - CBG 70 - 120: 0 units  - CBG 121 - 150: 3 units  - CBG 151 - 200: 4 units  - CBG 201 - 250: 7 units  - CBG 251 - 300: 11 units  - CBG 301 - 350: 15 units  - CBG 351 - 400: 20 units     insulin glargine 100 UNIT/ML injection  Commonly known as:  LANTUS  Inject 0.15 mLs (15 Units total) into the skin at bedtime.     tamsulosin 0.4 MG Caps capsule  Commonly known as:  FLOMAX  Take 1 capsule (0.4 mg total) by mouth daily.  Vitamin D (Ergocalciferol) 50000 UNITS Caps capsule  Commonly known as:  DRISDOL  Take 1 capsule (50,000 Units total) by mouth every 7 (seven) days. *takes on Friday*        No orders of the defined types were placed in this encounter.    Immunization History  Administered Date(s) Administered  . PPD Test 09/18/2013  . Pneumococcal Polysaccharide-23 05/17/2012  . Td 05/10/2005    Family History  Problem Relation Age of Onset  . Diabetes Mother   . Diabetes Sister   . Diabetes Brother     History  Substance Use Topics  . Smoking status: Never Smoker   . Smokeless tobacco: Never Used  . Alcohol Use: 0.6 oz/week    1 Cans of beer per week    Review of Systems  As noted in HPI  Filed Vitals:   10/14/13 1650  BP: 140/70  Pulse:   Temp:   Resp:     Physical Exam  Physical Exam  Constitutional:  Obese male sitting comfortably not in acute distress  Eyes: EOM  are normal. Pupils are equal, round, and reactive to light.  Neck:  Trach in place  Cardiovascular: Normal rate and regular rhythm.   Pulmonary/Chest: He has no wheezes. He has no rales.  Musculoskeletal: He exhibits edema.    CBC    Component Value Date/Time   WBC 6.8 09/18/2013 0811   RBC 2.97* 09/18/2013 0811   RBC 3.08* 04/30/2012 1233   HGB 9.0* 09/18/2013 0811   HCT 28.3* 09/18/2013 0811   PLT 225 09/18/2013 0811   MCV 95.3 09/18/2013 0811   LYMPHSABS 1.6 09/16/2013 1841   MONOABS 0.5 09/16/2013 1841   EOSABS 0.4 09/16/2013 1841   BASOSABS 0.1 09/16/2013 1841    CMP     Component Value Date/Time   NA 135 09/18/2013 0811   K 4.2 09/18/2013 0811   CL 93* 09/18/2013 0811   CO2 29 09/18/2013 0811   GLUCOSE 114* 09/18/2013 0811   BUN 26* 09/18/2013 0811   CREATININE 9.56* 09/18/2013 0811   CALCIUM 9.3 09/18/2013 0811   CALCIUM 6.2* 04/30/2012 1233   PROT 7.6 09/16/2013 1841   ALBUMIN 3.5 09/18/2013 0811   AST 24 09/16/2013 1841   ALT 29 09/16/2013 1841   ALKPHOS 54 09/16/2013 1841   BILITOT 0.2* 09/16/2013 1841   GFRNONAA 6* 09/18/2013 0811   GFRAA 7* 09/18/2013 0811    Lab Results  Component Value Date/Time   CHOL 187 11/27/2007 10:26 PM    No components found with this basename: hga1c    Lab Results  Component Value Date/Time   AST 24 09/16/2013  6:41 PM    Assessment and Plan  DM (diabetes mellitus) - Plan: HgB A1c7% well-controlled continue with Lantus andNovoLog sliding scale  End stage renal disease on dialysis Follow with nephrology hemodialysis 3 times a week and do blood work  Tracheostomy status Patient scheduled to see ENT.  HYPERTENSION Continue with amlodipine repeat blood pressure is  140/70, advise For low salt diet.  Unspecified vitamin D deficiency  OSA (obstructive sleep apnea)     Return in about 6 weeks (around 11/25/2013).  Lorayne Marek, MD

## 2013-10-15 ENCOUNTER — Telehealth: Payer: Self-pay

## 2013-10-15 NOTE — Telephone Encounter (Signed)
Called # and received VM. Advised tcb if anything further needed

## 2013-10-16 DIAGNOSIS — Z930 Tracheostomy status: Secondary | ICD-10-CM | POA: Diagnosis not present

## 2013-10-16 DIAGNOSIS — N186 End stage renal disease: Secondary | ICD-10-CM | POA: Diagnosis not present

## 2013-10-16 DIAGNOSIS — D631 Anemia in chronic kidney disease: Secondary | ICD-10-CM | POA: Diagnosis not present

## 2013-10-16 DIAGNOSIS — E1165 Type 2 diabetes mellitus with hyperglycemia: Secondary | ICD-10-CM | POA: Diagnosis not present

## 2013-10-16 DIAGNOSIS — E1129 Type 2 diabetes mellitus with other diabetic kidney complication: Secondary | ICD-10-CM | POA: Diagnosis not present

## 2013-10-16 DIAGNOSIS — L8992 Pressure ulcer of unspecified site, stage 2: Secondary | ICD-10-CM | POA: Diagnosis not present

## 2013-10-16 DIAGNOSIS — L89899 Pressure ulcer of other site, unspecified stage: Secondary | ICD-10-CM | POA: Diagnosis not present

## 2013-10-23 DIAGNOSIS — L8992 Pressure ulcer of unspecified site, stage 2: Secondary | ICD-10-CM | POA: Diagnosis not present

## 2013-10-23 DIAGNOSIS — Z930 Tracheostomy status: Secondary | ICD-10-CM | POA: Diagnosis not present

## 2013-10-23 DIAGNOSIS — E1129 Type 2 diabetes mellitus with other diabetic kidney complication: Secondary | ICD-10-CM | POA: Diagnosis not present

## 2013-10-23 DIAGNOSIS — N186 End stage renal disease: Secondary | ICD-10-CM | POA: Diagnosis not present

## 2013-10-23 DIAGNOSIS — D631 Anemia in chronic kidney disease: Secondary | ICD-10-CM | POA: Diagnosis not present

## 2013-10-23 DIAGNOSIS — L89899 Pressure ulcer of other site, unspecified stage: Secondary | ICD-10-CM | POA: Diagnosis not present

## 2013-10-26 ENCOUNTER — Emergency Department (HOSPITAL_COMMUNITY): Payer: Medicare Other

## 2013-10-26 ENCOUNTER — Encounter (HOSPITAL_COMMUNITY): Payer: Self-pay | Admitting: Emergency Medicine

## 2013-10-26 ENCOUNTER — Emergency Department (HOSPITAL_COMMUNITY)
Admission: EM | Admit: 2013-10-26 | Discharge: 2013-10-26 | Disposition: A | Discharge status: Home / Self Care | Payer: Medicare Other | Attending: Emergency Medicine | Admitting: Emergency Medicine

## 2013-10-26 DIAGNOSIS — I129 Hypertensive chronic kidney disease with stage 1 through stage 4 chronic kidney disease, or unspecified chronic kidney disease: Secondary | ICD-10-CM | POA: Insufficient documentation

## 2013-10-26 DIAGNOSIS — N189 Chronic kidney disease, unspecified: Secondary | ICD-10-CM | POA: Diagnosis not present

## 2013-10-26 DIAGNOSIS — G4730 Sleep apnea, unspecified: Secondary | ICD-10-CM | POA: Diagnosis not present

## 2013-10-26 DIAGNOSIS — E119 Type 2 diabetes mellitus without complications: Secondary | ICD-10-CM | POA: Diagnosis not present

## 2013-10-26 DIAGNOSIS — Z794 Long term (current) use of insulin: Secondary | ICD-10-CM | POA: Diagnosis not present

## 2013-10-26 DIAGNOSIS — R0989 Other specified symptoms and signs involving the circulatory and respiratory systems: Secondary | ICD-10-CM | POA: Diagnosis not present

## 2013-10-26 DIAGNOSIS — Z7982 Long term (current) use of aspirin: Secondary | ICD-10-CM | POA: Diagnosis not present

## 2013-10-26 DIAGNOSIS — J9509 Other tracheostomy complication: Secondary | ICD-10-CM | POA: Insufficient documentation

## 2013-10-26 DIAGNOSIS — Z9189 Other specified personal risk factors, not elsewhere classified: Secondary | ICD-10-CM | POA: Diagnosis not present

## 2013-10-26 HISTORY — DX: Sleep apnea, unspecified: G47.30

## 2013-10-26 NOTE — Progress Notes (Signed)
Rt suctioned and lavaged pt and cleaned out inner cannula. Inner cannula is disposable however it was occluded and new XL inner cannulas have been ordered by RN. Pt placed CAP back on trach with no desat or difficulty breathing, pt able to speak and states this feels a lot better. RT will continue to monitor.

## 2013-10-26 NOTE — ED Provider Notes (Signed)
CSN: [id number]     Arrival date & time 10/26/13  1057 History   First MD Initiated Contact with Patient 10/26/13 1120     Chief Complaint  Patient presents with  . Shortness of Breath    When trach cap is placed   (Consider location/radiation/quality/duration/timing/severity/associated sxs/prior Treatment) HPI Comments: Patient presents with shortness of breath. He had a recently placed tracheostomy tube due to respiratory failure this past November. He also has a history of morbid obesity, sleep apnea, diabetes mellitus and end-stage renal disease. He states she's been doing fine with the tracheostomy tube. He is followed by Dr. Redmond Baseman. The plan is that once he loses a certain amount of weight to remove the tracheostomy tube. He currently states he is exercising regularly. He's noted yesterday and today that he has increased shortness of breath when his tracheostomy tube was capped. He states during the daytime he normally leaves the cap on, but occasionally takes it off. He states when the cap is off, his breathing is fine. He feels some shortness of breath only when the tube is capped. He has had a little bit of increased mucous production. He denies any chest pain or change in his orthopnea. He denies any increased leg swelling. No fevers/chills. No increased cough. He just came from dialysis where he finished a complete course. He's been working out at [employer] however yesterday and today he noticed increased shortness of breath when his tracheostomy was capped as he was working out.  Patient is a 48 y.o. male presenting with shortness of breath.  Shortness of Breath Associated symptoms: no abdominal pain, no chest pain, no cough, no diaphoresis, no fever, no headaches, no rash and no vomiting     Past Medical History  Diagnosis Date  . Diabetes mellitus   . Hypertension   . Chronic kidney disease   . 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  Sleep apnea    Past Surgical History  Procedure Laterality Date  . Av fistula placement  05/03/2012    Procedure: ARTERIOVENOUS (AV) FISTULA CREATION;  Surgeon: Mal [name], MD;  Location: [facility];  Service: Vascular;  Laterality: Right;  . Tracheostomy tube placement N/A 08/20/2013    Procedure: TRACHEOSTOMY Revision;  Surgeon: Melida Quitter, MD;  Location: [facility] OR;  Service: ENT;  Laterality: N/A;   Family History  Problem Relation Age of Onset  . Diabetes Mother   . Diabetes Sister   . Diabetes Brother    History  Substance Use Topics  . Smoking status: Never Smoker   . Smokeless tobacco: Never Used  . Alcohol Use: 0.6 oz/week    1 Cans of beer per week    Review of Systems  Constitutional: Negative for fever, chills, diaphoresis and fatigue.  HENT: Negative for congestion, rhinorrhea and sneezing.   Eyes: Negative.   Respiratory: Positive for shortness of breath. Negative for cough and chest tightness.   Cardiovascular: Negative for chest pain and leg swelling.  Gastrointestinal: Negative for nausea, vomiting, abdominal pain, diarrhea and blood in stool.  Genitourinary: Negative for frequency, hematuria, flank pain and difficulty urinating.  Musculoskeletal: Negative for arthralgias and back pain.  Skin: Negative for rash.  Neurological: Negative for dizziness, speech difficulty, weakness, numbness and headaches.    Allergies  Review of patient's allergies indicates no known allergies.  Home Medications   Current Outpatient Rx  Name  Route  Sig  Dispense  Refill  . amLODipine (NORVASC) 10 MG tablet   Oral   Take 1 tablet (10 mg total) by mouth daily.   30 tablet   0   . aspirin EC 81 MG tablet   Oral   Take 81 mg by mouth daily.         . calcium acetate (PHOSLO) 667 MG capsule   Oral   Take 2 capsules (1,334 mg total) by mouth 3 (three) times daily with meals.   180 capsule   0   . darbepoetin (ARANESP) 40 MCG/0.4ML SOLN injection   Subcutaneous    Inject 0.4 mLs (40 mcg total) into the skin every Wednesday. **GIVEN AT HEMODIALYSIS**   8.4 mL      . fluticasone (FLONASE) 50 MCG/ACT nasal spray   Each Nare   Place 2 sprays into both nostrils daily.   1 g   0   . insulin aspart (NOVOLOG) 100 UNIT/ML injection   Subcutaneous   Inject 0-15 Units into the skin 3 (three) times daily with meals. CBG < 70: drink orange juice and recheck glucose or use glucose tabs CBG > 400: call MD   CBG 70 - 120: 0 units CBG 121 - 150: 3 units CBG 151 - 200: 4 units CBG 201 - 250: 7 units CBG 251 - 300: 11 units CBG 301 - 350: 15 units CBG 351 - 400: 20 units   1 vial   12   . insulin glargine (LANTUS) 100 UNIT/ML injection   Subcutaneous   Inject 0.15 mLs (15 Units total) into the skin at bedtime.   10 mL   12   . Silver-Carboxymethylcellulose (AQUACEL EXTRA HYDROFIBER) 2"X2" PADS   Apply externally   Apply 1 application topically daily as needed.   30 each   3     May cut size to fit wound, then cover with foam dr ...   . tamsulosin (FLOMAX) 0.4 MG CAPS capsule   Oral   Take 1 capsule (0.4 mg total) by mouth daily.   30 capsule   0   . Vitamin D, Ergocalciferol, (DRISDOL) 50000 UNITS CAPS capsule   Oral   Take 1 capsule (50,000 Units total) by mouth every 7 (seven) days. *takes on Friday*   30 capsule   0   . Wound Dressings (FOAM DRESSING BORDERED) PADS   Apply externally   Apply 1 each topically daily.   30 each   3     Foam dressing with adhesive boarder 2x2 size.  Cut ...    BP 156/84  Pulse 84  Temp(Src) 97.5 F (36.4 C) (Oral)  Resp 23  Ht 5\' 7"  (1.702 m)  Wt 330 lb 2 oz (149.744 kg)  BMI 51.69 kg/m2  SpO2 93% Physical Exam  Constitutional: He is oriented to person, place, and time. He appears well-developed and well-nourished.  HENT:  Head: Normocephalic and atraumatic.  Eyes: Pupils are equal, round, and reactive to light.  Neck: Normal range of motion. Neck supple.  Tracheostomy tube in place   Cardiovascular: Normal rate, regular rhythm and normal heart sounds.   Pulmonary/Chest: Effort normal and breath sounds normal. No respiratory distress. He has no wheezes. He has no rales. He exhibits no tenderness.  Abdominal: Soft. Bowel sounds are normal. There is no tenderness. There is no rebound and no guarding.  Musculoskeletal: Normal range of motion. He exhibits no edema.  Lymphadenopathy:    He has no cervical adenopathy.  Neurological: He is  alert and oriented to person, place, and time.  Skin: Skin is warm and dry. No rash noted.  Psychiatric: He has a normal mood and affect.    ED Course  Procedures (including critical care time) Labs Review Labs Reviewed - No data to display Imaging Review Dg Chest 2 View  10/26/2013   CLINICAL DATA:  Diabetes. Shortness breath after the in suction through his tracheostomy.  EXAM: CHEST  2 VIEW  COMPARISON:  One-view chest 09/16/2013.  FINDINGS: Mild cardiac enlargement is again noted. Mild pulmonary vascular congestion is similar to the prior exam. No focal airspace disease is evident. Degenerative changes are noted in the thoracic spine. The tracheostomy tube is stable.  IMPRESSION: 1. Borderline cardiomegaly and mild pulmonary vascular congestion are similar to the prior exam. 2. Stable tracheostomy tube.   Electronically Signed   By: Lawrence Santiago M.D.   On: 10/26/2013 13:17    EKG Interpretation   None       MDM   1. Tracheostomy obstruction    Patient had his trach tube suctions. The cannula was changed out by respiratory therapy. He states he feels much better after that. He's able to keep it capped without any problem now. He feels back to baseline. His chest x-ray is unchanged. I encouraged him to followup with ENT as needed if his symptoms continue.    Malvin Johns, MD 10/26/13 [phone]

## 2013-10-26 NOTE — ED Notes (Signed)
Resp at beside, suctioned trach, orders for new inner cannula #6.

## 2013-10-26 NOTE — ED Notes (Signed)
Patient transported to X-ray 

## 2013-10-26 NOTE — ED Notes (Addendum)
Resp at beside to replace inner cannula #6. Pt has red cap on.

## 2013-10-26 NOTE — ED Notes (Signed)
Resp called to see pt for trach asessment.

## 2013-10-26 NOTE — ED Notes (Signed)
Pt states that starting yesterday he has noticed that when he caps his trach he feels SOB.  Pt states that when the cap is off he feels fine.

## 2013-10-26 NOTE — ED Notes (Addendum)
Pt just came from dialysis to get trach checked out.  Pt states he was working out yesterday and breathing felt restricted.  Had cap to trach on.  Took trach cap off and breathing is normal and is able to talk clear with cap off, normally does not talk as clear with cap on.  No c/o pain, shortness of breath or other s/s at this time. Pt did change cannula this am.

## 2013-10-31 DIAGNOSIS — N039 Chronic nephritic syndrome with unspecified morphologic changes: Secondary | ICD-10-CM | POA: Diagnosis not present

## 2013-10-31 DIAGNOSIS — L89899 Pressure ulcer of other site, unspecified stage: Secondary | ICD-10-CM | POA: Diagnosis not present

## 2013-10-31 DIAGNOSIS — D631 Anemia in chronic kidney disease: Secondary | ICD-10-CM | POA: Diagnosis not present

## 2013-10-31 DIAGNOSIS — E1129 Type 2 diabetes mellitus with other diabetic kidney complication: Secondary | ICD-10-CM | POA: Diagnosis not present

## 2013-10-31 DIAGNOSIS — Z930 Tracheostomy status: Secondary | ICD-10-CM | POA: Diagnosis not present

## 2013-10-31 DIAGNOSIS — L8992 Pressure ulcer of unspecified site, stage 2: Secondary | ICD-10-CM | POA: Diagnosis not present

## 2013-10-31 DIAGNOSIS — N186 End stage renal disease: Secondary | ICD-10-CM | POA: Diagnosis not present

## 2013-11-01 DIAGNOSIS — G4733 Obstructive sleep apnea (adult) (pediatric): Secondary | ICD-10-CM | POA: Diagnosis not present

## 2013-11-01 DIAGNOSIS — Z930 Tracheostomy status: Secondary | ICD-10-CM | POA: Diagnosis not present

## 2013-11-01 DIAGNOSIS — J9600 Acute respiratory failure, unspecified whether with hypoxia or hypercapnia: Secondary | ICD-10-CM | POA: Diagnosis not present

## 2013-11-10 DIAGNOSIS — N186 End stage renal disease: Secondary | ICD-10-CM | POA: Diagnosis not present

## 2013-11-13 ENCOUNTER — Ambulatory Visit (INDEPENDENT_AMBULATORY_CARE_PROVIDER_SITE_OTHER): Payer: Medicare Other | Admitting: Pulmonary Disease

## 2013-11-13 ENCOUNTER — Encounter: Payer: Self-pay | Admitting: Pulmonary Disease

## 2013-11-13 VITALS — BP 130/82 | HR 88 | Ht 68.0 in | Wt 333.0 lb

## 2013-11-13 DIAGNOSIS — G4733 Obstructive sleep apnea (adult) (pediatric): Secondary | ICD-10-CM | POA: Diagnosis not present

## 2013-11-13 DIAGNOSIS — Z930 Tracheostomy status: Secondary | ICD-10-CM

## 2013-11-13 NOTE — Progress Notes (Deleted)
   Subjective:    [name], male    DOB: [birth year], 48 y.o.   MRN: [id number]  HPI    Review of Systems  Constitutional: Negative for fever, chills, diaphoresis, activity change, appetite change, fatigue and unexpected weight change.  HENT: Negative for congestion, dental problem, ear discharge, ear pain, facial swelling, hearing loss, mouth sores, nosebleeds, postnasal drip, rhinorrhea, sinus pressure, sneezing, sore throat, tinnitus, trouble swallowing and voice change.   Eyes: Negative for photophobia, discharge, itching and visual disturbance.  Respiratory: Negative for apnea, cough, choking, chest tightness, shortness of breath, wheezing and stridor.   Cardiovascular: Negative for chest pain, palpitations and leg swelling.  Gastrointestinal: Negative for nausea, vomiting, abdominal pain, constipation, blood in stool and abdominal distention.  Genitourinary: Negative for dysuria, urgency, frequency, hematuria, flank pain, decreased urine volume and difficulty urinating.  Musculoskeletal: Negative for arthralgias, back pain, gait problem, joint swelling, myalgias, neck pain and neck stiffness.  Skin: Negative for color change, pallor and rash.  Neurological: Negative for dizziness, tremors, seizures, syncope, speech difficulty, weakness, light-headedness, numbness and headaches.  Hematological: Negative for adenopathy. Does not bruise/bleed easily.  Psychiatric/Behavioral: Negative for confusion, sleep disturbance and agitation. The patient is not nervous/anxious.        Objective:   Physical Exam        Assessment & Plan:

## 2013-11-13 NOTE — Progress Notes (Signed)
Chief Complaint  Patient presents with  . Sleep Consult    referred by Dr. Redmond Baseman for sleep issues. Epworth Score: 7.    History of Present Illness: Seth [name] is a 48 y.o. male with hx of OSA and s/p tracheostomy November 2014.  He has history of sleep apnea, and was using CPAP he got from [place] >> his machine is very old.  He had respiratory failure in November and required tracheostomy.  He has since changed his lifestyle and lost almost 50 lbs.  He still uses his CPAP at night with with tracheostomy capped sometimes.  His tracheostomy is capped during the day.  He has a #6 trach.   He goes to sleep at 1 am.  He falls asleep quickly.  He sleeps through the night.  He gets out of bed at 6 am.  He feels rested in the morning.  He denies morning headache.  He does not use anything to help him fall sleep or stay awake.  He denies sleep walking, sleep talking, bruxism, or nightmares.  There is no history of restless legs.  He denies sleep hallucinations, sleep paralysis, or cataplexy.  He gets dialysis on Monday, Wednesday, Friday.  The Epworth score is 7 out of 24.  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  Seth [name]  has past surgical history that includes AV fistula placement (05/03/2012) and Tracheostomy tube placement (N/A, 08/20/2013).  Prior to Admission medications   Medication Sig Start Date End Date Taking? Authorizing Provider  amLODipine (NORVASC) 10 MG tablet Take 1 tablet (10 mg total) by mouth daily. 09/19/13  Yes Donita Brooks, NP  aspirin EC 81 MG tablet Take 81 mg by mouth daily.   Yes Historical Provider, MD  calcium acetate (PHOSLO) 667 MG capsule Take 2 capsules (1,334 mg total) by mouth 3 (three) times daily with meals. 09/19/13  Yes Donita Brooks, NP  darbepoetin (ARANESP) 40 MCG/0.4ML SOLN injection Inject 0.4 mLs (40 mcg total) into the  skin every Wednesday. **GIVEN AT HEMODIALYSIS** 09/18/13  Yes Donita Brooks, NP  fluticasone (FLONASE) 50 MCG/ACT nasal spray Place 2 sprays into both nostrils daily. 09/19/13  Yes Donita Brooks, NP  insulin aspart (NOVOLOG) 100 UNIT/ML injection Inject 0-15 Units into the skin 3 (three) times daily with meals. CBG < 70: drink orange juice and recheck glucose or use glucose tabs CBG > 400: call MD   CBG 70 - 120: 0 units CBG 121 - 150: 3 units CBG 151 - 200: 4 units CBG 201 - 250: 7 units CBG 251 - 300: 11 units CBG 301 - 350: 15 units CBG 351 - 400: 20 units 09/19/13  Yes Donita Brooks, NP  insulin glargine (LANTUS) 100 UNIT/ML injection Inject 0.15 mLs (15 Units total) into the skin at bedtime. 09/19/13  Yes Donita Brooks, NP  Silver-Carboxymethylcellulose (AQUACEL EXTRA HYDROFIBER) 2"X2" PADS Apply 1 application topically daily as needed. 09/18/13  Yes Donita Brooks, NP  tamsulosin (FLOMAX) 0.4 MG CAPS capsule Take 1 capsule (0.4 mg total) by mouth daily. 09/19/13  Yes Donita Brooks, NP  Vitamin D, Ergocalciferol, (DRISDOL) 50000 UNITS CAPS capsule Take 1 capsule (50,000 Units total) by mouth every 7 (seven) days. *takes on Friday* 09/19/13  Yes Donita Brooks, NP  Wound Dressings (FOAM DRESSING BORDERED) PADS Apply 1 each topically daily. 09/18/13  Yes Brandi  Talbert Forest, NP    No Known Allergies  His family history includes Diabetes in his brother, mother, and sister.  He  reports that he has never smoked. He has never used smokeless tobacco. He reports that he drinks about 0.6 ounces of alcohol per week. He reports that he uses illicit drugs (Marijuana).  Review of Systems  Constitutional: Negative for fever, chills, diaphoresis, activity change, appetite change, fatigue and unexpected weight change.  HENT: Negative for congestion, dental problem, ear discharge, ear pain, facial swelling, hearing loss, mouth sores, nosebleeds, postnasal drip, rhinorrhea, sinus pressure, sneezing,  sore throat, tinnitus, trouble swallowing and voice change.   Eyes: Negative for photophobia, discharge, itching and visual disturbance.  Respiratory: Negative for apnea, cough, choking, chest tightness, shortness of breath, wheezing and stridor.   Cardiovascular: Negative for chest pain, palpitations and leg swelling.  Gastrointestinal: Negative for nausea, vomiting, abdominal pain, constipation, blood in stool and abdominal distention.  Genitourinary: Negative for dysuria, urgency, frequency, hematuria, flank pain, decreased urine volume and difficulty urinating.  Musculoskeletal: Negative for arthralgias, back pain, gait problem, joint swelling, myalgias, neck pain and neck stiffness.  Skin: Negative for color change, pallor and rash.  Neurological: Negative for dizziness, tremors, seizures, syncope, speech difficulty, weakness, light-headedness, numbness and headaches.  Hematological: Negative for adenopathy. Does not bruise/bleed easily.  Psychiatric/Behavioral: Negative for confusion, sleep disturbance and agitation. The patient is not nervous/anxious.    Physical Exam:  General - No distress ENT - No sinus tenderness, no oral exudate, no LAN, no thyromegaly, TM clear, pupils equal/reactive, MP 3, trach site clean Cardiac - s1s2 regular, no murmur, pulses symmetric Chest - No wheeze/rales/dullness, good air entry, normal respiratory excursion Back - No focal tenderness Abd - Soft, non-tender, no organomegaly, + bowel sounds Ext - No edema, AV graft Rt forearm Neuro - Normal strength, cranial nerves intact Skin - No rashes Psych - Normal mood, and behavior  Assessment/plan:  Chesley Mires, MD [place] 11/13/2013, 2:01 PM Pager:  [phone] After 3pm call: [phone]

## 2013-11-13 NOTE — Assessment & Plan Note (Signed)
He is followed by Dr. Redmond Baseman with ENT.  Hopefully, if he continues to lose weight, then he may be candidate for decannulation.

## 2013-11-13 NOTE — Assessment & Plan Note (Signed)
He has history of sleep apnea, and has been using CPAP.  He had acute respiratory failure in November 2014, and required tracheostomy.  He has since lost significant weight.  He is interested in continuing CPAP therapy, and is hopefully to be decannulated once he loses enough weight.  To further assess the status of his sleep apnea will arrange for sleep study with his tracheostomy capped.

## 2013-11-13 NOTE — Patient Instructions (Signed)
Will arrange for sleep study Will call to arrange for follow up after sleep study reviewed 

## 2013-11-18 DIAGNOSIS — D631 Anemia in chronic kidney disease: Secondary | ICD-10-CM | POA: Diagnosis not present

## 2013-11-18 DIAGNOSIS — L89899 Pressure ulcer of other site, unspecified stage: Secondary | ICD-10-CM | POA: Diagnosis not present

## 2013-11-18 DIAGNOSIS — Z930 Tracheostomy status: Secondary | ICD-10-CM | POA: Diagnosis not present

## 2013-11-18 DIAGNOSIS — E1129 Type 2 diabetes mellitus with other diabetic kidney complication: Secondary | ICD-10-CM | POA: Diagnosis not present

## 2013-11-18 DIAGNOSIS — N186 End stage renal disease: Secondary | ICD-10-CM | POA: Diagnosis not present

## 2013-11-18 DIAGNOSIS — L8992 Pressure ulcer of unspecified site, stage 2: Secondary | ICD-10-CM | POA: Diagnosis not present

## 2013-11-25 ENCOUNTER — Ambulatory Visit: Payer: Medicare Other | Admitting: Internal Medicine

## 2013-12-08 DIAGNOSIS — N186 End stage renal disease: Secondary | ICD-10-CM | POA: Diagnosis not present

## 2013-12-16 ENCOUNTER — Encounter (HOSPITAL_BASED_OUTPATIENT_CLINIC_OR_DEPARTMENT_OTHER): Payer: Medicare Other

## 2014-01-08 DIAGNOSIS — N186 End stage renal disease: Secondary | ICD-10-CM | POA: Diagnosis not present

## 2014-02-06 DIAGNOSIS — Z930 Tracheostomy status: Secondary | ICD-10-CM | POA: Diagnosis not present

## 2014-02-06 DIAGNOSIS — J9600 Acute respiratory failure, unspecified whether with hypoxia or hypercapnia: Secondary | ICD-10-CM | POA: Diagnosis not present

## 2014-02-06 DIAGNOSIS — G4733 Obstructive sleep apnea (adult) (pediatric): Secondary | ICD-10-CM | POA: Diagnosis not present

## 2014-02-07 DIAGNOSIS — N186 End stage renal disease: Secondary | ICD-10-CM | POA: Diagnosis not present

## 2014-02-12 DIAGNOSIS — Z930 Tracheostomy status: Secondary | ICD-10-CM | POA: Diagnosis not present

## 2014-02-12 DIAGNOSIS — G4733 Obstructive sleep apnea (adult) (pediatric): Secondary | ICD-10-CM | POA: Diagnosis not present

## 2014-03-10 DIAGNOSIS — N186 End stage renal disease: Secondary | ICD-10-CM | POA: Diagnosis not present

## 2014-04-09 DIAGNOSIS — N186 End stage renal disease: Secondary | ICD-10-CM | POA: Diagnosis not present

## 2014-05-10 DIAGNOSIS — N186 End stage renal disease: Secondary | ICD-10-CM | POA: Diagnosis not present

## 2014-05-10 DIAGNOSIS — Z992 Dependence on renal dialysis: Secondary | ICD-10-CM | POA: Diagnosis not present

## 2014-06-10 DIAGNOSIS — N186 End stage renal disease: Secondary | ICD-10-CM | POA: Diagnosis not present

## 2014-06-10 DIAGNOSIS — Z992 Dependence on renal dialysis: Secondary | ICD-10-CM | POA: Diagnosis not present

## 2015-05-01 ENCOUNTER — Encounter (HOSPITAL_COMMUNITY): Payer: Self-pay | Admitting: Nurse Practitioner

## 2015-05-01 ENCOUNTER — Emergency Department (HOSPITAL_COMMUNITY)
Admission: EM | Admit: 2015-05-01 | Discharge: 2015-05-01 | Disposition: A | Discharge status: Home / Self Care | Payer: Medicare Other | Attending: Emergency Medicine | Admitting: Emergency Medicine

## 2015-05-01 ENCOUNTER — Emergency Department (HOSPITAL_COMMUNITY): Payer: Medicare Other

## 2015-05-01 DIAGNOSIS — Y9289 Other specified places as the place of occurrence of the external cause: Secondary | ICD-10-CM | POA: Diagnosis not present

## 2015-05-01 DIAGNOSIS — R0781 Pleurodynia: Secondary | ICD-10-CM | POA: Diagnosis not present

## 2015-05-01 DIAGNOSIS — Y939 Activity, unspecified: Secondary | ICD-10-CM | POA: Diagnosis not present

## 2015-05-01 DIAGNOSIS — Y998 Other external cause status: Secondary | ICD-10-CM | POA: Diagnosis not present

## 2015-05-01 DIAGNOSIS — Z7982 Long term (current) use of aspirin: Secondary | ICD-10-CM | POA: Insufficient documentation

## 2015-05-01 DIAGNOSIS — X58XXXA Exposure to other specified factors, initial encounter: Secondary | ICD-10-CM | POA: Insufficient documentation

## 2015-05-01 DIAGNOSIS — Z8669 Personal history of other diseases of the nervous system and sense organs: Secondary | ICD-10-CM | POA: Insufficient documentation

## 2015-05-01 DIAGNOSIS — N189 Chronic kidney disease, unspecified: Secondary | ICD-10-CM | POA: Insufficient documentation

## 2015-05-01 DIAGNOSIS — S29001A Unspecified injury of muscle and tendon of front wall of thorax, initial encounter: Secondary | ICD-10-CM | POA: Diagnosis not present

## 2015-05-01 DIAGNOSIS — E119 Type 2 diabetes mellitus without complications: Secondary | ICD-10-CM | POA: Diagnosis not present

## 2015-05-01 DIAGNOSIS — Z79899 Other long term (current) drug therapy: Secondary | ICD-10-CM | POA: Insufficient documentation

## 2015-05-01 DIAGNOSIS — I129 Hypertensive chronic kidney disease with stage 1 through stage 4 chronic kidney disease, or unspecified chronic kidney disease: Secondary | ICD-10-CM | POA: Insufficient documentation

## 2015-05-01 DIAGNOSIS — T148 Other injury of unspecified body region: Secondary | ICD-10-CM | POA: Insufficient documentation

## 2015-05-01 DIAGNOSIS — T148XXA Other injury of unspecified body region, initial encounter: Secondary | ICD-10-CM

## 2015-05-01 DIAGNOSIS — S3992XA Unspecified injury of lower back, initial encounter: Secondary | ICD-10-CM | POA: Diagnosis not present

## 2015-05-01 DIAGNOSIS — Z794 Long term (current) use of insulin: Secondary | ICD-10-CM | POA: Diagnosis not present

## 2015-05-01 DIAGNOSIS — S39012A Strain of muscle, fascia and tendon of lower back, initial encounter: Secondary | ICD-10-CM | POA: Diagnosis not present

## 2015-05-01 MED ORDER — METHOCARBAMOL 500 MG PO TABS: 500.0000 mg | ORAL_TABLET | Freq: Two times a day (BID) | ORAL | Status: DC

## 2015-05-01 NOTE — ED Provider Notes (Signed)
CSN: [id number]     Arrival date & time 05/01/15  1126 History  This chart was scribed for non-physician practitioner, Abigail Butts, PA-C working with Daleen Bo, MD by Tula Nakayama, ED scribe. This patient was seen in room TR08C/TR08C and the patient's care was started at 1:25 PM   Chief Complaint  Patient presents with  . Back Pain   The history is provided by the patient. No language interpreter was used.    HPI Comments: Seth [name] is a 49 y.o. male with a history of DM, HTN and CKD who presents to the Emergency Department complaining of constant, moderate, sharp right-sided rib pain that radiates to his back and started 3 days ago. He states pain becomes worse with laughing, coughing and sneezing. Pt has not tried any treatment for his symptoms PTA. Pt reports onset of pain started after he was lifting weights and swimming at the gym, which is abnormal for him. He denies recent falls or tobacco use. Pt had dialysis this morning and denies any problems with the treatment. He also denies fever, chills, cough, SOB, numbness and tingling as associated symptoms.   Past Medical History  Diagnosis Date  . Diabetes mellitus   . Hypertension   . Chronic kidney disease   . 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 Sleep apnea    Past Surgical History  Procedure Laterality Date  . Av fistula placement  05/03/2012    Procedure: ARTERIOVENOUS (AV) FISTULA CREATION;  Surgeon: Mal [name], MD;  Location: [place];  Service: Vascular;  Laterality: Right;  . Tracheostomy tube placement N/A 08/20/2013    Procedure: TRACHEOSTOMY Revision;  Surgeon: Melida Quitter, MD;  Location: [facility] OR;  Service: ENT;  Laterality: N/A;   Family History  Problem Relation Age of Onset  . Diabetes Mother   . Diabetes Sister   . Diabetes Brother    History  Substance Use Topics  . Smoking status: Never Smoker   . Smokeless tobacco: Never Used  .  Alcohol Use: 0.6 oz/week    1 Cans of beer per week    Review of Systems  Constitutional: Negative for fever and fatigue.  Respiratory: Negative for chest tightness and shortness of breath.   Cardiovascular: Negative for chest pain.  Gastrointestinal: Negative for nausea, vomiting, abdominal pain and diarrhea.  Genitourinary: Negative for dysuria, urgency, frequency and hematuria.  Musculoskeletal: Negative for back pain, joint swelling, gait problem, neck pain and neck stiffness.       Right rib pain  Skin: Negative for rash.  Neurological: Negative for weakness, light-headedness, numbness and headaches.  All other systems reviewed and are negative.  Allergies  Review of patient's allergies indicates no known allergies.  Home Medications   Prior to Admission medications   Medication Sig Start Date End Date Taking? Authorizing Provider  amLODipine (NORVASC) 10 MG tablet Take 1 tablet (10 mg total) by mouth daily. 09/19/13   Donita Brooks, NP  aspirin EC 81 MG tablet Take 81 mg by mouth daily.    Historical Provider, MD  calcium acetate (PHOSLO) 667 MG capsule Take 2 capsules (1,334 mg total) by mouth 3 (three) times daily with meals. 09/19/13   Donita Brooks, NP  darbepoetin (ARANESP) 40 MCG/0.4ML SOLN injection Inject 0.4 mLs (40 mcg total) into the skin every Wednesday. **GIVEN AT HEMODIALYSIS** 09/18/13   Donita Brooks, NP  fluticasone (FLONASE) 50 MCG/ACT nasal  spray Place 2 sprays into both nostrils daily. 09/19/13   Donita Brooks, NP  insulin aspart (NOVOLOG) 100 UNIT/ML injection Inject 0-15 Units into the skin 3 (three) times daily with meals. CBG < 70: drink orange juice and recheck glucose or use glucose tabs CBG > 400: call MD   CBG 70 - 120: 0 units CBG 121 - 150: 3 units CBG 151 - 200: 4 units CBG 201 - 250: 7 units CBG 251 - 300: 11 units CBG 301 - 350: 15 units CBG 351 - 400: 20 units 09/19/13   Donita Brooks, NP  insulin glargine (LANTUS) 100 UNIT/ML  injection Inject 0.15 mLs (15 Units total) into the skin at bedtime. 09/19/13   Donita Brooks, NP  methocarbamol (ROBAXIN) 500 MG tablet Take 1 tablet (500 mg total) by mouth 2 (two) times daily. 05/01/15   Darryl Blumenstein, PA-C  Silver-Carboxymethylcellulose (AQUACEL EXTRA HYDROFIBER) 2"X2" PADS Apply 1 application topically daily as needed. 09/18/13   Donita Brooks, NP  tamsulosin (FLOMAX) 0.4 MG CAPS capsule Take 1 capsule (0.4 mg total) by mouth daily. 09/19/13   Donita Brooks, NP  Vitamin D, Ergocalciferol, (DRISDOL) 50000 UNITS CAPS capsule Take 1 capsule (50,000 Units total) by mouth every 7 (seven) days. *takes on Friday* 09/19/13   Donita Brooks, NP  Wound Dressings (FOAM DRESSING BORDERED) PADS Apply 1 each topically daily. 09/18/13   Donita Brooks, NP   BP 146/79 mmHg  Pulse 88  Temp(Src) 97.9 F (36.6 C) (Oral)  Resp 16  Ht 5\' 8"  (1.727 m)  Wt 320 lb 11.2 oz (145.469 kg)  BMI 48.77 kg/m2  SpO2 98% Physical Exam  Constitutional: He appears well-developed and well-nourished. No distress.  HENT:  Head: Normocephalic and atraumatic.  Mouth/Throat: Oropharynx is clear and moist. No oropharyngeal exudate.  Eyes: Conjunctivae are normal.  Neck: Normal range of motion. Neck supple.  Full ROM without pain  Cardiovascular: Normal rate, regular rhythm and intact distal pulses.   Pulmonary/Chest: Effort normal and breath sounds normal. No respiratory distress. He has no wheezes. He exhibits tenderness.  TTP of ribs 5-8 on the lateral and anterior side; no ecchymosis, contusion, palpable deformity or flail segment Equal chest rise; clear and equal breath sounds  Abdominal: Soft. He exhibits no distension. There is no tenderness.  Musculoskeletal:  Full range of motion of the T-spine and L-spine No tenderness to palpation of the spinous processes of the T-spine or L-spine No tenderness to palpation of the paraspinous muscles of the L-spine  Lymphadenopathy:    He has no  cervical adenopathy.  Neurological: He is alert. He has normal reflexes.  Reflex Scores:      Bicep reflexes are 2+ on the right side and 2+ on the left side.      Brachioradialis reflexes are 2+ on the right side and 2+ on the left side.      Patellar reflexes are 2+ on the right side and 2+ on the left side.      Achilles reflexes are 2+ on the right side and 2+ on the left side. Speech is clear and goal oriented, follows commands Normal 5/5 strength in upper and lower extremities bilaterally including dorsiflexion and plantar flexion, strong and equal grip strength Sensation normal to light and sharp touch Moves extremities without ataxia, coordination intact Normal gait Normal balance No Clonus   Skin: Skin is warm and dry. No rash noted. He is not diaphoretic. No erythema.  Psychiatric: He  has a normal mood and affect. His behavior is normal.  Nursing note and vitals reviewed.   ED Course  Procedures   DIAGNOSTIC STUDIES: Oxygen Saturation is 96% on RA, normal by my interpretation.    COORDINATION OF CARE: 1:31 PM Discussed treatment plan with pt which includes a chest x-ray. Pt agreed to plan.  2:17 PM Discussed x-ray results and discharge with pt, including prescription for muscle relaxant and Tylenol, as needed.  Labs Review Labs Reviewed - No data to display  Imaging Review Dg Ribs Unilateral W/chest Right  05/01/2015   CLINICAL DATA:  Two day history of right lower anterior rib pain. No recent trauma.  EXAM: RIGHT RIBS AND CHEST - 3+ VIEW  COMPARISON:  October 26, 2013 chest radiograph  FINDINGS: Frontal chest as well as oblique and cone-down lower rib images were obtained. There is no edema or consolidation. The heart size and pulmonary vascularity are normal. No adenopathy. No pneumothorax or effusion. There are no demonstrable rib fractures. There is evidence of old trauma involving the lateral right clavicle, stable.  IMPRESSION: No apparent rib fracture. No edema or  consolidation. Stable old trauma involving the lateral right clavicle.   Electronically Signed   By: Lowella Grip III M.D.   On: 05/01/2015 13:59     EKG Interpretation None      MDM   Final diagnoses:  Rib pain on right side  Muscle strain   [name] presents with right sided rib pain after beginning a new exercise regimen.  Pt with Hx of IDDM and ESRD on dialysis (last dialysis today without complication or change in symptoms), but denies central chest pain, SOB, DOE or orthopnea to suggest CHF.  No fevers or cough to indicate pneumonia.  Pain is reproducible with palpation and movement.  Will obtain CXR to r/o pleural effusion, mass or rib fracture.    CXR without acute abnormality.  Pt will be d/c home with muscle relaxer and instruction for conservative management.    BP 146/79 mmHg  Pulse 88  Temp(Src) 97.9 F (36.6 C) (Oral)  Resp 16  Ht 5\' 8"  (1.727 m)  Wt 320 lb 11.2 oz (145.469 kg)  BMI 48.77 kg/m2  SpO2 98%  I personally performed the services described in this documentation, which was scribed in my presence. The recorded information has been reviewed and is accurate.   Jarrett Soho Kennede Lusk, PA-C 05/01/15 [place], MD 05/01/15 [phone]

## 2015-05-01 NOTE — Discharge Instructions (Signed)
1. Medications: Robaxin, usual home medications 2. Treatment: rest, drink plenty of fluids, tylenol as needed for pain with muscle relaxer 3. Follow Up: Please followup with your primary doctor in 3 days for discussion of your diagnoses and further evaluation after today's visit; if you do not have a primary care doctor use the resource guide provided to find one; Please return to the ER for worsening symptoms, difficulty breathing or other concerns   Chest Wall Pain Chest wall pain is pain in or around the bones and muscles of your chest. It may take up to 6 weeks to get better. It may take longer if you must stay physically active in your work and activities.  CAUSES  Chest wall pain may happen on its own. However, it may be caused by:  A viral illness like the flu.  Injury.  Coughing.  Exercise.  Arthritis.  Fibromyalgia.  Shingles. HOME CARE INSTRUCTIONS   Avoid overtiring physical activity. Try not to strain or perform activities that cause pain. This includes any activities using your chest or your abdominal and side muscles, especially if heavy weights are used.  Put ice on the sore area.  Put ice in a plastic bag.  Place a towel between your skin and the bag.  Leave the ice on for 15-20 minutes per hour while awake for the first 2 days.  Only take over-the-counter or prescription medicines for pain, discomfort, or fever as directed by your caregiver. SEEK IMMEDIATE MEDICAL CARE IF:   Your pain increases, or you are very uncomfortable.  You have a fever.  Your chest pain becomes worse.  You have new, unexplained symptoms.  You have nausea or vomiting.  You feel sweaty or lightheaded.  You have a cough with phlegm (sputum), or you cough up blood. MAKE SURE YOU:   Understand these instructions.  Will watch your condition.  Will get help right away if you are not doing well or get worse. Document Released: 09/26/2005 Document Revised: 12/19/2011 Document  Reviewed: 05/23/2011 [facility] Patient Information 2015 [place], [place]. This information is not intended to replace advice given to you by your health care provider. Make sure you discuss any questions you have with your health care provider.

## 2015-05-01 NOTE — ED Notes (Signed)
He went swimming and lifted weights Tuesday, he began to notice sharp pain in Left lower back after that. States 'it feels like a muscle spasm." he has tried nothing for the pain at home

## 2015-06-09 ENCOUNTER — Encounter: Payer: Self-pay | Admitting: Pharmacist

## 2015-06-17 ENCOUNTER — Emergency Department (HOSPITAL_COMMUNITY): Payer: Medicare Other

## 2015-06-17 ENCOUNTER — Emergency Department (HOSPITAL_COMMUNITY)
Admission: EM | Admit: 2015-06-17 | Discharge: 2015-06-18 | Disposition: A | Discharge status: Home / Self Care | Payer: Medicare Other | Attending: Emergency Medicine | Admitting: Emergency Medicine

## 2015-06-17 ENCOUNTER — Encounter (HOSPITAL_COMMUNITY): Payer: Self-pay | Admitting: Family Medicine

## 2015-06-17 DIAGNOSIS — M545 Low back pain, unspecified: Secondary | ICD-10-CM

## 2015-06-17 DIAGNOSIS — Z7982 Long term (current) use of aspirin: Secondary | ICD-10-CM | POA: Diagnosis not present

## 2015-06-17 DIAGNOSIS — N189 Chronic kidney disease, unspecified: Secondary | ICD-10-CM | POA: Diagnosis not present

## 2015-06-17 DIAGNOSIS — M4316 Spondylolisthesis, lumbar region: Secondary | ICD-10-CM | POA: Insufficient documentation

## 2015-06-17 DIAGNOSIS — Z8669 Personal history of other diseases of the nervous system and sense organs: Secondary | ICD-10-CM | POA: Insufficient documentation

## 2015-06-17 DIAGNOSIS — I129 Hypertensive chronic kidney disease with stage 1 through stage 4 chronic kidney disease, or unspecified chronic kidney disease: Secondary | ICD-10-CM | POA: Diagnosis not present

## 2015-06-17 DIAGNOSIS — E119 Type 2 diabetes mellitus without complications: Secondary | ICD-10-CM | POA: Diagnosis not present

## 2015-06-17 DIAGNOSIS — Z79899 Other long term (current) drug therapy: Secondary | ICD-10-CM | POA: Diagnosis not present

## 2015-06-17 DIAGNOSIS — Z794 Long term (current) use of insulin: Secondary | ICD-10-CM | POA: Diagnosis not present

## 2015-06-17 DIAGNOSIS — Z7951 Long term (current) use of inhaled steroids: Secondary | ICD-10-CM | POA: Insufficient documentation

## 2015-06-17 LAB — COMPREHENSIVE METABOLIC PANEL
ALBUMIN: 3.7 g/dL (ref 3.5–5.0)
ALT: 11 U/L — ABNORMAL LOW (ref 17–63)
ANION GAP: 15 (ref 5–15)
AST: 13 U/L — AB (ref 15–41)
Alkaline Phosphatase: 89 U/L (ref 38–126)
BUN: 36 mg/dL — AB (ref 6–20)
CHLORIDE: 91 mmol/L — AB (ref 101–111)
CO2: 29 mmol/L (ref 22–32)
Calcium: 8.6 mg/dL — ABNORMAL LOW (ref 8.9–10.3)
Creatinine, Ser: 7.8 mg/dL — ABNORMAL HIGH (ref 0.61–1.24)
GFR calc Af Amer: 8 mL/min — ABNORMAL LOW (ref >60)
GFR calc non Af Amer: 7 mL/min — ABNORMAL LOW (ref >60)
GLUCOSE: 245 mg/dL — AB (ref 65–99)
POTASSIUM: 4.2 mmol/L (ref 3.5–5.1)
Sodium: 135 mmol/L (ref 135–145)
TOTAL PROTEIN: 9 g/dL — AB (ref 6.5–8.1)
Total Bilirubin: 0.8 mg/dL (ref 0.3–1.2)

## 2015-06-17 LAB — CBC
HEMATOCRIT: 34.2 % — AB (ref 39.0–52.0)
Hemoglobin: 11.1 g/dL — ABNORMAL LOW (ref 13.0–17.0)
MCH: 29.9 pg (ref 26.0–34.0)
MCHC: 32.5 g/dL (ref 30.0–36.0)
MCV: 92.2 fL (ref 78.0–100.0)
Platelets: 290 10*3/uL (ref 150–400)
RBC: 3.71 MIL/uL — ABNORMAL LOW (ref 4.22–5.81)
RDW: 13.7 % (ref 11.5–15.5)
WBC: 8.5 10*3/uL (ref 4.0–10.5)

## 2015-06-17 LAB — SEDIMENTATION RATE: Sed Rate: 110 mm/hr — ABNORMAL HIGH (ref 0–16)

## 2015-06-17 MED ORDER — METHOCARBAMOL 500 MG PO TABS
1000.0000 mg | ORAL_TABLET | Freq: Three times a day (TID) | ORAL | Status: DC | PRN
  Administered 2015-06-17: 1000 mg via ORAL
  Filled 2015-06-17: qty 2

## 2015-06-17 MED ORDER — DIAZEPAM 2 MG PO TABS: 2.0000 mg | ORAL_TABLET | Freq: Three times a day (TID) | ORAL | Status: DC | PRN

## 2015-06-17 NOTE — ED Provider Notes (Signed)
CSN: [id number]     Arrival date & time 06/17/15  1209 History   First MD Initiated Contact with Patient 06/17/15 1353     Chief Complaint  Patient presents with  . Back Pain    Patient is a 49 y.o. male presenting with back pain.  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 He was seen in the ED about 2 weeks ago and given methocarbamolfor muscle spasms which hasn't helped. He's lost about 100lbs in the last year which he says is from diet and exercise. He denies fever, IVDA, paresthesias, leg weakness, bowel/bladder incontinence.  Past Medical History  Diagnosis Date  . Diabetes mellitus   . Hypertension   . Chronic kidney disease   . 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 Sleep apnea    Past Surgical History  Procedure Laterality Date  . Av fistula placement  05/03/2012    Procedure: ARTERIOVENOUS (AV) FISTULA CREATION;  Surgeon: Mal [name], MD;  Location: [place];  Service: Vascular;  Laterality: Right;  . Tracheostomy tube placement N/A 08/20/2013    Procedure: TRACHEOSTOMY Revision;  Surgeon: Melida Quitter, MD;  Location: [facility] OR;  Service: ENT;  Laterality: N/A;   Family History  Problem Relation Age of Onset  . Diabetes Mother   . Diabetes Sister   . Diabetes Brother    Social History  Substance Use Topics  . Smoking status: Never Smoker   . Smokeless tobacco: Never Used  . Alcohol Use: 0.6 oz/week    1 Cans of beer per week    Review of Systems  Constitutional: Positive for activity change and unexpected weight change. Negative for fever and chills.  Respiratory: Negative for chest tightness and shortness of breath.   Cardiovascular: Negative for chest pain and leg swelling.  Gastrointestinal:  Negative for nausea, vomiting, abdominal pain and diarrhea.  Genitourinary: Negative for dysuria, flank pain and difficulty urinating.  Musculoskeletal: Positive for myalgias, back pain and gait problem. Negative for arthralgias, neck pain and neck stiffness.  Skin: Negative for rash.  Neurological: Negative for tremors, seizures, weakness, numbness and headaches.  Hematological: Negative for adenopathy.    Allergies  Review of patient's allergies indicates no known allergies.  Home Medications   Prior to Admission medications   Medication Sig Start Date End Date Taking? Authorizing Provider  amLODipine (NORVASC) 10 MG tablet Take 1 tablet (10 mg total) by mouth daily. 09/19/13   Donita Brooks, NP  aspirin EC 81 MG tablet Take 81 mg by mouth daily.    Historical Provider, MD  calcium acetate (PHOSLO) 667 MG capsule Take 2 capsules (1,334 mg total) by mouth 3 (three) times daily with meals. 09/19/13   Donita Brooks, NP  darbepoetin (ARANESP) 40 MCG/0.4ML SOLN injection Inject 0.4 mLs (40 mcg total) into the skin every Wednesday. **GIVEN AT HEMODIALYSIS** 09/18/13   Donita Brooks, NP  fluticasone (FLONASE) 50 MCG/ACT nasal spray Place 2 sprays into both nostrils daily. 09/19/13   Donita Brooks, NP  insulin aspart (NOVOLOG) 100 UNIT/ML injection Inject 0-15 Units into the skin 3 (three) times daily with meals. CBG < 70: drink orange juice and recheck glucose  or use glucose tabs CBG > 400: call MD   CBG 70 - 120: 0 units CBG 121 - 150: 3 units CBG 151 - 200: 4 units CBG 201 - 250: 7 units CBG 251 - 300: 11 units CBG 301 - 350: 15 units CBG 351 - 400: 20 units 09/19/13   Donita Brooks, NP  insulin glargine (LANTUS) 100 UNIT/ML injection Inject 0.15 mLs (15 Units total) into the skin at bedtime. 09/19/13   Donita Brooks, NP  methocarbamol (ROBAXIN) 500 MG tablet Take 1 tablet (500 mg total) by mouth 2 (two) times daily. 05/01/15   [name] Muthersbaugh, PA-C   Silver-Carboxymethylcellulose (AQUACEL EXTRA HYDROFIBER) 2"X2" PADS Apply 1 application topically daily as needed. 09/18/13   Donita Brooks, NP  tamsulosin (FLOMAX) 0.4 MG CAPS capsule Take 1 capsule (0.4 mg total) by mouth daily. 09/19/13   Donita Brooks, NP  Vitamin D, Ergocalciferol, (DRISDOL) 50000 UNITS CAPS capsule Take 1 capsule (50,000 Units total) by mouth every 7 (seven) days. *takes on Friday* 09/19/13   Donita Brooks, NP  Wound Dressings (FOAM DRESSING BORDERED) PADS Apply 1 each topically daily. 09/18/13   Donita Brooks, NP   BP 91/70 mmHg  Pulse 67  Temp(Src) 98.1 F (36.7 C) (Oral)  Resp 16  Ht 5\' 8"  (1.727 m)  Wt 300 lb (136.079 kg)  BMI 45.63 kg/m2  SpO2 95%   Physical Exam  Constitutional: He appears well-developed and well-nourished.  HENT:  Head: Normocephalic and atraumatic.  Eyes: Conjunctivae and EOM are normal.  Neck: Normal range of motion. Neck supple.  Cardiovascular: Normal rate and regular rhythm.   Pulmonary/Chest: Effort normal and breath sounds normal.  Abdominal: Soft. Bowel sounds are normal.  Musculoskeletal: He exhibits tenderness.  Point tenderness over L4-L5 spinous processes Pain on spinal rotation and flexion  Neurological: He is alert. He has normal reflexes. He displays normal reflexes. No cranial nerve deficit. He exhibits normal muscle tone. Coordination normal.  Skin: Skin is warm. No rash noted.  Psychiatric: He has a normal mood and affect. His behavior is normal.    ED Course  Procedures (including critical care time)  Labs Review Labs Reviewed  COMPREHENSIVE METABOLIC PANEL  SEDIMENTATION RATE  CBC    Imaging Review No results found. I have personally reviewed and evaluated these images and lab results as part of my medical decision-making.   EKG Interpretation None      MDM   Final diagnoses:  Back pain at L4-L5 level  Spondylolisthesis at L4-L5 level    49 yo M with ESRD on dialysis presents with  chronic lower back pain found to have spondylolisthesis on spinal x-ray. We obtained the x-ray because he had point tenderness and 100lb weight loss over the last year which he accredited to diet and exercise; however he did not have any other red flags concerning for infection or cord compromise. He was discharged home with contact information for [facility]ractor and 2mg  valium TID as needed for pain without refill.  Loleta Chance, MD 06/17/15 [id number]  Carmin Muskrat, MD 06/17/15 [phone]

## 2015-06-17 NOTE — Discharge Instructions (Signed)
Spondylolisthesis with Rehab The slipping of one or multiple vertebrae out of the correct anatomical position is a condition known as spondylolisthesis. Spondylolisthesis is most common in adolescents and is caused by a number of different reasons, such as vertebral fracture or something you are born with (congenital). Spondylolisthesis is diagnosed with the use of X-rays. SYMPTOMS   Dull, achy pain in the lower back.  Pain that worsens with extension of the spine.  Tightness of the muscles on the back of the thigh.  Lower back stiffness.  Signs of nerve damage: pain, numbness, or weakness affecting one or both lower extremities.  Muscle wasting (atrophy), uncommon.  Loss of stool (bowel) or urine (bladder) function. CAUSES  The symptoms of spondylolisthesis are caused by one or more vertebrae that are out of alignment, placing pressure on the spinal cord. Common mechanisms of injury include:  Congenital defect of the spine.  Degenerative process.  Stress fracture of the spine.  Fracture due to trauma to the spine. RISK INCREASES WITH:  Activities that have a risk of hyperextending the back.  Activities that have a risk of excessively rotating the spine.  Poor strength and flexibility.  Failure to warm up properly before activity.  Family history of spondylolysis or spondylolisthesis.  Improper sports technique. PREVENTION  Warm up and stretch properly before activity.  Allow for adequate recovery between workouts.  Maintain physical fitness:  Strength, flexibility, and endurance.  Cardiovascular fitness.  Learn and use proper technique. When possible, have a coach correct improper technique. PROGNOSIS  If treated properly, the spondylolisthesis usually resolves. RELATED COMPLICATIONS   Recurrent symptoms that result in a chronic problem.  Inability to compete in athletics.  Prolonged healing time, if improperly treated or reinjured.  Failure of the  fracture to heal (nonunion).  Healing of the fracture in a poor position (malunion). TREATMENT Treatment initially involves resting from any activities that aggravate the symptoms and the use of ice and medications to help reduce pain and inflammation. The use of strengthening and stretching exercises may help reduce pain with activity. These exercises may be performed at home or with referral to a therapist. It is important to learn how to use proper body mechanics as to not place undue stress on your spine. If the injury is severe, then your caregiver may recommend a back brace to allow for healing, or even surgery. Surgery often involves fusing two adjacent vertebrae so no movement is allowed between them.  MEDICATION   If pain medication is necessary, then nonsteroidal anti-inflammatory medications, such as aspirin and ibuprofen, or other minor pain relievers, such as acetaminophen, are often recommended.  Do not take pain medication for 7 days before surgery.  Prescription pain relievers may be given if deemed necessary by your caregiver. Use only as directed and only as much as you need. HEAT AND COLD  Cold treatment (icing) relieves pain and reduces inflammation. Cold treatment should be applied for 10 to 15 minutes every 2 to 3 hours for inflammation and pain and immediately after any activity that aggravates your symptoms. Use ice packs or massage the area with a piece of ice (ice massage).  Heat treatment may be used prior to performing the stretching and strengthening activities prescribed by your caregiver, physical therapist, or athletic trainer. Use a heat pack or soak the injury in warm water. SEEK MEDICAL CARE IF:  Treatment seems to offer no benefit, or the condition worsens.  Any medications produce adverse side effects.  Any complications from surgery  occur:  Pain, numbness, or coldness in the extremity operated upon.  Discoloration of the nail beds (they become blue or  gray) of the extremity operated upon.  Signs of infections (fever, pain, inflammation, redness, or persistent bleeding). EXERCISES RANGE OF MOTION (ROM) AND STRETCHING EXERCISES - Spondylolisthesis Most people with low back pain will find that their symptoms worsen with either excessive bending forward (flexion) or arching at the low back (extension). The exercises which will help resolve your symptoms will focus on the opposite motion. Your physician, physical therapist or athletic trainer will help you determine which exercises will be most helpful to resolve your low back pain. Do not complete any exercises without first consulting with your clinician. Discontinue any exercises which worsen your symptoms until you speak to your clinician. If you have pain, numbness or tingling which travels down into your buttocks, leg, or foot, the goal of the therapy is for these symptoms to move closer to your back and eventually resolve. Occasionally, these leg symptoms will get better, but your low back pain may worsen; this is typically an indication of progress in your rehabilitation. Be certain to be very alert to any changes in your symptoms and the activities in which you participated in the 24 hours prior to the change. Sharing this information with your clinician will allow him/her to most efficiently treat your condition. These exercises may help you when beginning to rehabilitate your injury. Your symptoms may resolve with or without further involvement from your physician, physical therapist or athletic trainer. While completing these exercises, remember:   Restoring tissue flexibility helps normal motion to return to the joints. This allows healthier, less painful movement and activity.  An effective stretch should be held for at least 30 seconds.  A stretch should never be painful. You should only feel a gentle lengthening or release in the stretched tissue. FLEXION RANGE OF MOTION AND STRETCHING  EXERCISES: STRETCH - Flexion, Single Knee to Chest  Lie on a firm bed or floor with both legs extended in front of you.  Keeping one leg in contact with the floor, bring your opposite knee to your chest. Hold your leg in place by either grabbing behind your thigh or at your knee.  Pull until you feel a gentle stretch in your low back. Hold __________ seconds. Slowly release your grasp and repeat the exercise with the opposite side. Repeat __________ times. Complete this exercise __________ times per day.  STRETCH - Flexion, Double Knee to Chest  Lie on a firm bed or floor with both legs extended in front of you.  Keeping one leg in contact with the floor, bring your opposite knee to your chest.  Tense your stomach muscles to support your back and then lift your other knee to your chest. Hold your legs in place by either grabbing behind your thighs or at your knees.  Pull both knees toward your chest until you feel a gentle stretch in your low back. Hold __________ seconds.  Tense your stomach muscles and slowly return one leg at a time to the floor. Repeat __________ times. Complete this exercise __________ times per day.  STRENGTHENING EXERCISES - Spondylolisthesis These exercises may help you when beginning to rehabilitate your injury. These exercises should be done near your "sweet spot." This is the neutral, low-back arch, somewhere between fully rounded and fully arched, that is your least painful position. When performed in this safe range of motion, these exercises can be used for people who have  either a flexion or extension based injury. These exercises may resolve your symptoms with or without further involvement from your physician, physical therapist or athletic trainer. While completing these exercises, remember:   Muscles can gain both the endurance and the strength needed for everyday activities through controlled exercises.  Complete these exercises as instructed by your  physician, physical therapist or athletic trainer. Progress the resistance and repetitions only as guided.  You may experience muscle soreness or fatigue, but the pain or discomfort you are trying to eliminate should never worsen during these exercises. If this pain does worsen, stop and make certain you are following the directions exactly. If the pain is still present after adjustments, discontinue the exercise until you can discuss the trouble with your clinician. STRENGTHENING - Deep Abdominals, Pelvic Tilt   Lie on a firm bed or floor. Keeping your legs in front of you, bend your knees so they are both pointed toward the ceiling and your feet are flat on the floor.  Tense your lower abdominal muscles to press your low back into the floor. This motion will rotate your pelvis so that your tail bone is scooping upwards rather than pointing at your feet or into the floor.  With a gentle tension and even breathing, hold this position for __________ seconds. Repeat __________ times. Complete this exercise __________ times per day.  STRENGTHENING - Abdominals, Crunches   Lie on a firm bed or floor. Keeping your legs in front of you, bend your knees so they are both pointed toward the ceiling and your feet are flat on the floor. Cross your arms over your chest.  Slightly tip your chin down without bending your neck.  Tense your abdominals and slowly lift your trunk high enough to just clear your shoulder blades. Lifting higher can put excessive stress on the low back and does not further strengthen your abdominal muscles.  Control your return to the starting position. Repeat __________ times. Complete this exercise __________ times per day.  STRENGTHENING - Quadruped, Opposite UE/LE Lift   Assume a hands and knees position on a firm surface. Keep your hands under your shoulders and your knees under your hips. You may place padding under your knees for comfort.  Find your neutral spine and  gently tense your abdominal muscles so that you can maintain this position. Your shoulders and hips should form a rectangle that is parallel with the floor and is not twisted.  Keeping your trunk steady, lift your right hand no higher than your shoulder and then your left leg no higher than your hip. Make sure you are not holding your breath. Hold this position __________ seconds.  Continuing to keep your abdominal muscles tense and your back steady, slowly return to your starting position. Repeat with the opposite arm and leg. Repeat __________ times. Complete this exercise __________ times per day.  STRENGTHENING - Lower Abdominals, Double Knee Lift  Lie on a firm bed or floor. Keeping your legs in front of you, bend your knees so they are both pointed toward the ceiling and your feet are flat on the floor.  Tense your abdominal muscles to brace your low back and slowly lift both of your knees until they come over your hips. Be certain not to hold your breath.  Hold __________ seconds. Using your abdominal muscles, return to the starting position in a slow and controlled manner. Repeat __________ times. Complete this exercise __________ times per day.  POSTURE AND BODY MECHANICS CONSIDERATIONS -  Spondylolisthesis Keeping correct posture when sitting, standing or completing your activities will reduce the stress put on different body tissues, allowing injured tissues a chance to heal and limiting painful experiences. The following are general guidelines for improved posture. Your physician or physical therapist will provide you with any instructions specific to your needs. While reading these guidelines, remember:  The exercises prescribed by your provider will help you have the flexibility and strength to maintain correct postures.  The correct posture provides the optimal environment for your joints to work. All of your joints have less wear and tear when properly supported by a spine with good  posture. This means you will experience a healthier, less painful body.  Correct posture must be practiced with all of your activities, especially prolonged sitting and standing. Correct posture is as important when doing repetitive low-stress activities (typing) as it is when doing a single heavy-load activity (lifting). PROPER SITTING POSTURE In order to minimize stress and discomfort on your spine, you must sit with correct posture. Sitting with good posture should be effortless for a healthy body. Returning to good posture is a gradual process. Many people can work toward this most comfortably by using various supports until they have the flexibility and strength to maintain this posture on their own. When sitting with proper posture, your ears will fall over your shoulders and your shoulders will fall over your hips. You should use the back of the chair to support your upper back. Your low back will be in a neutral position, just slightly arched. You may place a small pillow or folded towel at the base of your low back for  support.  When working at a desk, create an environment that supports good, upright posture. Without extra support, muscles fatigue and lead to excessive strain on joints and other tissues. Keep these recommendations in mind: CHAIR:  A chair should be able to slide under your desk when your back makes contact with the back of the chair. This allows you to work closely.  The chair's height should allow your eyes to be level with the upper part of your monitor and your hands to be slightly lower than your elbows. BODY POSITION  Your feet should make contact with the floor. If this is not possible, use a foot rest.  Keep your ears over your shoulders. This will reduce stress on your neck and low back. INCORRECT SITTING POSTURES If you are feeling tired and unable to assume a healthy sitting posture, do not slouch or slump. This puts excessive strain on your back tissues,  causing more damage and pain. Healthier options include:  Using more support, like a lumbar pillow.  Switching tasks to something that requires you to be upright or walking.  Taking a brief walk.  Lying down to rest in a neutral-spine position.  CORRECT LIFTING TECHNIQUES DO:   Assume a wide stance. This will provide you more stability and the opportunity to get as close as possible to the object which you are lifting.  Tense your abdominals to brace your spine; then bend at the knees and hips. Keeping your back locked in a neutral-spine position, lift using your leg muscles. Lift with your legs, keeping your back straight.  Test the weight of unknown objects before attempting to lift them.  Try to keep your elbows locked down at your sides in order get the best strength from your shoulders when carrying an object.  Always ask for help when lifting heavy or  awkward objects. INCORRECT LIFTING TECHNIQUES DO NOT:   Lock your knees when lifting, even if it is a small object.  Bend and twist. Pivot at your feet or move your feet when needing to change directions.  Assume that you cannot safely pick up a paperclip without proper posture. Document Released: 09/26/2005 Document Revised: 02/10/2014 Document Reviewed: 01/08/2009 [facility] Patient Information 2015 [place], [place]. This information is not intended to replace advice given to you by your health care provider. Make sure you discuss any questions you have with your health care provider.

## 2015-06-17 NOTE — ED Notes (Signed)
Pt requested water to drink and something to eat. OK per RN Angie Fava pt given water and [place] sandwich.

## 2015-06-17 NOTE — ED Notes (Signed)
MD at bedside. 

## 2015-06-17 NOTE — ED Notes (Signed)
Pt here for back spasms. sts radiates around to his stomach. Denies any N,V,D. Pt last dialysis this am.

## 2015-06-24 ENCOUNTER — Encounter (HOSPITAL_COMMUNITY): Payer: Self-pay | Admitting: Neurology

## 2015-06-24 ENCOUNTER — Emergency Department (HOSPITAL_COMMUNITY): Payer: Medicare Other

## 2015-06-24 ENCOUNTER — Emergency Department (HOSPITAL_COMMUNITY)
Admission: EM | Admit: 2015-06-24 | Discharge: 2015-06-24 | Disposition: A | Discharge status: Home / Self Care | Payer: Medicare Other | Attending: Emergency Medicine | Admitting: Emergency Medicine

## 2015-06-24 DIAGNOSIS — R52 Pain, unspecified: Secondary | ICD-10-CM | POA: Insufficient documentation

## 2015-06-24 DIAGNOSIS — E119 Type 2 diabetes mellitus without complications: Secondary | ICD-10-CM | POA: Diagnosis not present

## 2015-06-24 DIAGNOSIS — N186 End stage renal disease: Secondary | ICD-10-CM | POA: Insufficient documentation

## 2015-06-24 DIAGNOSIS — R059 Cough, unspecified: Secondary | ICD-10-CM

## 2015-06-24 DIAGNOSIS — Z9189 Other specified personal risk factors, not elsewhere classified: Secondary | ICD-10-CM | POA: Insufficient documentation

## 2015-06-24 DIAGNOSIS — R5383 Other fatigue: Secondary | ICD-10-CM | POA: Diagnosis not present

## 2015-06-24 DIAGNOSIS — R630 Anorexia: Secondary | ICD-10-CM | POA: Insufficient documentation

## 2015-06-24 DIAGNOSIS — Z7951 Long term (current) use of inhaled steroids: Secondary | ICD-10-CM | POA: Insufficient documentation

## 2015-06-24 DIAGNOSIS — E669 Obesity, unspecified: Secondary | ICD-10-CM | POA: Insufficient documentation

## 2015-06-24 DIAGNOSIS — Z7982 Long term (current) use of aspirin: Secondary | ICD-10-CM | POA: Insufficient documentation

## 2015-06-24 DIAGNOSIS — Z992 Dependence on renal dialysis: Secondary | ICD-10-CM | POA: Diagnosis not present

## 2015-06-24 DIAGNOSIS — Z8669 Personal history of other diseases of the nervous system and sense organs: Secondary | ICD-10-CM | POA: Diagnosis not present

## 2015-06-24 DIAGNOSIS — R05 Cough: Secondary | ICD-10-CM | POA: Diagnosis not present

## 2015-06-24 DIAGNOSIS — Z79899 Other long term (current) drug therapy: Secondary | ICD-10-CM | POA: Diagnosis not present

## 2015-06-24 DIAGNOSIS — I120 Hypertensive chronic kidney disease with stage 5 chronic kidney disease or end stage renal disease: Secondary | ICD-10-CM | POA: Insufficient documentation

## 2015-06-24 DIAGNOSIS — Z794 Long term (current) use of insulin: Secondary | ICD-10-CM | POA: Diagnosis not present

## 2015-06-24 LAB — BASIC METABOLIC PANEL
ANION GAP: 17 — AB (ref 5–15)
BUN: 29 mg/dL — AB (ref 6–20)
CALCIUM: 8.8 mg/dL — AB (ref 8.9–10.3)
CO2: 29 mmol/L (ref 22–32)
Chloride: 86 mmol/L — ABNORMAL LOW (ref 101–111)
Creatinine, Ser: 6.53 mg/dL — ABNORMAL HIGH (ref 0.61–1.24)
GFR calc Af Amer: 10 mL/min — ABNORMAL LOW (ref >60)
GFR, EST NON AFRICAN AMERICAN: 9 mL/min — AB (ref >60)
GLUCOSE: 341 mg/dL — AB (ref 65–99)
Potassium: 3.9 mmol/L (ref 3.5–5.1)
SODIUM: 132 mmol/L — AB (ref 135–145)

## 2015-06-24 LAB — CBC WITH DIFFERENTIAL/PLATELET
BASOS ABS: 0 10*3/uL (ref 0.0–0.1)
BASOS PCT: 0 %
EOS ABS: 0.4 10*3/uL (ref 0.0–0.7)
EOS PCT: 4 %
HCT: 34.6 % — ABNORMAL LOW (ref 39.0–52.0)
Hemoglobin: 11.3 g/dL — ABNORMAL LOW (ref 13.0–17.0)
LYMPHS PCT: 13 %
Lymphs Abs: 1.4 10*3/uL (ref 0.7–4.0)
MCH: 29.7 pg (ref 26.0–34.0)
MCHC: 32.7 g/dL (ref 30.0–36.0)
MCV: 91.1 fL (ref 78.0–100.0)
MONO ABS: 0.7 10*3/uL (ref 0.1–1.0)
Monocytes Relative: 7 %
Neutro Abs: 7.7 10*3/uL (ref 1.7–7.7)
Neutrophils Relative %: 76 %
PLATELETS: 283 10*3/uL (ref 150–400)
RBC: 3.8 MIL/uL — ABNORMAL LOW (ref 4.22–5.81)
RDW: 13.8 % (ref 11.5–15.5)
WBC: 10.2 10*3/uL (ref 4.0–10.5)

## 2015-06-24 LAB — I-STAT VENOUS BLOOD GAS, ED
Acid-Base Excess: 7 mmol/L — ABNORMAL HIGH (ref 0.0–2.0)
Bicarbonate: 33.1 mEq/L — ABNORMAL HIGH (ref 20.0–24.0)
O2 SAT: 22 %
PH VEN: 7.392 — AB (ref 7.250–7.300)
TCO2: 35 mmol/L (ref 0–100)
pCO2, Ven: 54.4 mmHg — ABNORMAL HIGH (ref 45.0–50.0)
pO2, Ven: 17 mmHg — CL (ref 30.0–45.0)

## 2015-06-24 LAB — CBG MONITORING, ED: GLUCOSE-CAPILLARY: 183 mg/dL — AB (ref 65–99)

## 2015-06-24 MED ORDER — INSULIN ASPART 100 UNIT/ML ~~LOC~~ SOLN: 5.0000 [IU] | Freq: Once | SUBCUTANEOUS | Status: DC

## 2015-06-24 MED ORDER — ACETAMINOPHEN 325 MG PO TABS
650.0000 mg | ORAL_TABLET | Freq: Once | ORAL | Status: AC
  Administered 2015-06-24: 650 mg via ORAL
  Filled 2015-06-24: qty 2

## 2015-06-24 MED ORDER — ACETAMINOPHEN 500 MG PO TABS: 500.0000 mg | ORAL_TABLET | Freq: Four times a day (QID) | ORAL | Status: DC | PRN

## 2015-06-24 NOTE — ED Notes (Signed)
Pt hooked back up to monitor with BP cuff and pulse ox

## 2015-06-24 NOTE — Discharge Instructions (Signed)
1. Medications: tylenol, usual home medications 2. Treatment: rest, drink plenty of fluids  3. Follow Up: please followup with your primary doctor for discussion of your diagnoses and further evaluation after today's visit;  please return to the ER for high fever, chest pain, shortness of breath, new or worsening symptoms   Cough, Adult  A cough is a reflex. It helps you clear your throat and airways. A cough can help heal your body. A cough can last 2 or 3 weeks (acute) or may last more than 8 weeks (chronic). Some common causes of a cough can include an infection, allergy, or a cold. HOME CARE  Only take medicine as told by your doctor.  If given, take your medicines (antibiotics) as told. Finish them even if you start to feel better.  Use a cold steam vaporizer or humidifier in your home. This can help loosen thick spit (secretions).  Sleep so you are almost sitting up (semi-upright). Use pillows to do this. This helps reduce coughing.  Rest as needed.  Stop smoking if you smoke. GET HELP RIGHT AWAY IF:  You have yellowish-white fluid (pus) in your thick spit.  Your cough gets worse.  Your medicine does not reduce coughing, and you are losing sleep.  You cough up blood.  You have trouble breathing.  Your pain gets worse and medicine does not help.  You have a fever. MAKE SURE YOU:   Understand these instructions.  Will watch your condition.  Will get help right away if you are not doing well or get worse. Document Released: 06/09/2011 Document Revised: 02/10/2014 Document Reviewed: 06/09/2011 [facility] Patient Information 2015 [place], [place]. This information is not intended to replace advice given to you by your health care provider. Make sure you discuss any questions you have with your health care provider.

## 2015-06-24 NOTE — ED Notes (Signed)
Pt had dialysis today, took flu vaccine on Friday. Since then has been having body aches, no fever, non-productive cough. Sent here for antibiotics.

## 2015-06-24 NOTE — ED Provider Notes (Signed)
CSN: [id number]     Arrival date & time 06/24/15  1200 History   First MD Initiated Contact with Patient 06/24/15 1408     Chief Complaint  Patient presents with  . Generalized Body Aches  . Cough    HPI   Seth MARINELARENA is a 49 y.o. male with a PMH of ESRD on HD, DM, HTN who presents to the ED with generalized body aches and cough productive of clear sputum. He states he had his flu vaccine on Friday at dialysis, and reports his symptoms have been present since that time. He has tried robitussin and Copywriter, advertising for symptom relief, which have been minimally effective. He denies fever, though reports intermittent chills. He denies headache, lightheadedness, dizziness, syncope, chest pain, shortness of breath, abdominal pain, N/V/D/C. He states his last dialysis session was today, which he tolerated well.   Past Medical History  Diagnosis Date  . Diabetes mellitus   . Hypertension   . Chronic kidney disease   . 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 Sleep apnea    Past Surgical History  Procedure Laterality Date  . Av fistula placement  05/03/2012    Procedure: ARTERIOVENOUS (AV) FISTULA CREATION;  Surgeon: Mal [name], MD;  Location: [place];  Service: Vascular;  Laterality: Right;  . Tracheostomy tube placement N/A 08/20/2013    Procedure: TRACHEOSTOMY Revision;  Surgeon: Melida Quitter, MD;  Location: [facility] OR;  Service: ENT;  Laterality: N/A;   Family History  Problem Relation Age of Onset  . Diabetes Mother   . Diabetes Sister   . Diabetes Brother    Social History  Substance Use Topics  . Smoking status: Never Smoker   . Smokeless tobacco: Never Used  . Alcohol Use: 0.6 oz/week    1 Cans of beer per week      Review of Systems  Constitutional: Positive for appetite change and fatigue. Negative for fever, chills and activity change.       Reports decreased appetite.  Respiratory: Positive for cough. Negative for  shortness of breath.   Cardiovascular: Negative for chest pain, palpitations and leg swelling.  Gastrointestinal: Negative for nausea, vomiting, abdominal pain, diarrhea, constipation and abdominal distention.  Genitourinary: Negative for dysuria, urgency and frequency.  Musculoskeletal: Positive for myalgias, back pain and arthralgias.       Reports back pain, unchanged from baseline. Reports generalized body aches.  Neurological: Negative for dizziness, syncope, weakness, light-headedness, numbness and headaches.  All other systems reviewed and are negative.     Allergies  Review of patient's allergies indicates no known allergies.  Home Medications   Prior to Admission medications   Medication Sig Start Date End Date Taking? Authorizing Provider  amLODipine (NORVASC) 10 MG tablet Take 1 tablet (10 mg total) by mouth daily. 09/19/13   Donita Brooks, NP  aspirin EC 81 MG tablet Take 81 mg by mouth daily.    Historical Provider, MD  calcium acetate (PHOSLO) 667 MG capsule Take 2 capsules (1,334 mg total) by mouth 3 (three) times daily with meals. 09/19/13   Donita Brooks, NP  darbepoetin (ARANESP) 40 MCG/0.4ML SOLN injection Inject 0.4 mLs (40 mcg total) into the skin every Wednesday. **GIVEN AT HEMODIALYSIS** 09/18/13   Donita Brooks, NP  diazepam (VALIUM) 2 MG tablet Take 1 tablet (2 mg total) by mouth every 8 (eight) hours as needed for muscle spasms. 06/17/15  Loleta Chance, MD  fluticasone [facility]) 50 MCG/ACT nasal spray Place 2 sprays into both nostrils daily. 09/19/13   Donita Brooks, NP  insulin aspart (NOVOLOG) 100 UNIT/ML injection Inject 0-15 Units into the skin 3 (three) times daily with meals. CBG < 70: drink orange juice and recheck glucose or use glucose tabs CBG > 400: call MD   CBG 70 - 120: 0 units CBG 121 - 150: 3 units CBG 151 - 200: 4 units CBG 201 - 250: 7 units CBG 251 - 300: 11 units CBG 301 - 350: 15 units CBG 351 - 400: 20 units 09/19/13   Donita Brooks,  NP  insulin glargine (LANTUS) 100 UNIT/ML injection Inject 0.15 mLs (15 Units total) into the skin at bedtime. 09/19/13   Donita Brooks, NP  Silver-Carboxymethylcellulose (AQUACEL EXTRA HYDROFIBER) 2"X2" PADS Apply 1 application topically daily as needed. 09/18/13   Donita Brooks, NP  tamsulosin (FLOMAX) 0.4 MG CAPS capsule Take 1 capsule (0.4 mg total) by mouth daily. 09/19/13   Donita Brooks, NP  Vitamin D, Ergocalciferol, (DRISDOL) 50000 UNITS CAPS capsule Take 1 capsule (50,000 Units total) by mouth every 7 (seven) days. *takes on Friday* 09/19/13   Donita Brooks, NP  Wound Dressings (FOAM DRESSING BORDERED) PADS Apply 1 each topically daily. 09/18/13   Donita Brooks, NP    BP 162/104 mmHg  Pulse 95  Temp(Src) 98.2 F (36.8 C) (Oral)  Resp 20  SpO2 95% Physical Exam  Constitutional: He is oriented to person, place, and time. No distress.  Obese male in no acute distress.  HENT:  Head: Normocephalic and atraumatic.  Right Ear: External ear normal.  Left Ear: External ear normal.  Nose: Nose normal.  Mouth/Throat: Uvula is midline, oropharynx is clear and moist and mucous membranes are normal.  Eyes: Conjunctivae, EOM and lids are normal. Pupils are equal, round, and reactive to light. Right eye exhibits no discharge. Left eye exhibits no discharge. No scleral icterus.  Neck: Normal range of motion. Neck supple.  Cardiovascular: Normal rate, regular rhythm, normal heart sounds, intact distal pulses and normal pulses.   Pulmonary/Chest: Effort normal. No respiratory distress. He has no wheezes. He has no rales.  Abdominal: Soft. Normal appearance and bowel sounds are normal. He exhibits no distension and no mass. There is no tenderness. There is no rigidity, no rebound and no guarding.  Musculoskeletal: Normal range of motion. He exhibits no edema or tenderness.  Neurological: He is alert and oriented to person, place, and time.  Skin: Skin is warm, dry and intact. No rash noted.  He is not diaphoretic. No erythema. No pallor.  Psychiatric: He has a normal mood and affect. His speech is normal and behavior is normal. Judgment and thought content normal.  Nursing note and vitals reviewed.   ED Course  Procedures (including critical care time)  Labs Review Labs Reviewed  CBC WITH DIFFERENTIAL/PLATELET - Abnormal; Notable for the following:    RBC 3.80 (*)    Hemoglobin 11.3 (*)    HCT 34.6 (*)    All other components within normal limits  BASIC METABOLIC PANEL - Abnormal; Notable for the following:    Sodium 132 (*)    Chloride 86 (*)    Glucose, Bld 341 (*)    BUN 29 (*)    Creatinine, Ser 6.53 (*)    Calcium 8.8 (*)    GFR calc non Af Amer 9 (*)    GFR calc Af Wyvonnia Lora  10 (*)    Anion gap 17 (*)    All other components within normal limits  CBG MONITORING, ED - Abnormal; Notable for the following:    Glucose-Capillary 183 (*)    All other components within normal limits  I-STAT VENOUS BLOOD GAS, ED - Abnormal; Notable for the following:    pH, Ven 7.392 (*)    pCO2, Ven 54.4 (*)    pO2, Ven 17.0 (*)    Bicarbonate 33.1 (*)    Acid-Base Excess 7.0 (*)    All other components within normal limits  BLOOD GAS, VENOUS    Imaging Review Dg Chest 2 View  06/24/2015   CLINICAL DATA:  Cough, body aches  EXAM: CHEST  2 VIEW  COMPARISON:  05/01/2015  FINDINGS: Cardiomediastinal silhouette is stable. There is streaky right basilar atelectasis or early infiltrate. No pulmonary edema. Mild degenerative changes thoracic spine.  IMPRESSION: Streaky right basilar atelectasis or infiltrate. No pulmonary edema. Degenerative changes thoracic spine.   Electronically Signed   By: Lahoma Crocker M.D.   On: 06/24/2015 15:08     I have personally reviewed and evaluated these images and lab results as part of my medical decision-making.   EKG Interpretation None      MDM   Final diagnoses:  Cough  Body aches    49 year old male presents with generalized body aches and  cough productive of clear sputum since getting his flu vaccine on Friday. He states he feels this way each time he gets his flu vaccine. Denies fever, headache, lightheadedness, dizziness, syncope, chest pain, shortness of breath, abdominal pain, N/V/D/C.  Patient is afebrile. Vital signs stable. Heart regular rate and rhythm, lungs clear to auscultation bilaterally, abdomen soft, non-tender, non-distended.   CBC remarkable for anemia with hemoglobin 11.3. BMP with glucose elevated at 341. Creatinine elevated, which appears stable from baseline. Anion gap 17. Repeat blood glucose 183. Venous blood gas demonstrates pH 7.39, CO2 54, bicarb 33. CXR with streaky right basilar atelectasis or infiltrate, no pulmonary edema.  Patient discussed with Dr. Winfred Leeds. Low suspicion for pneumonia at this time given the patient typically experiences these symptoms s/p flu vaccine and has minimal cough with a benign lung exam. Feel patient is stable for discharge. Patient to follow-up with PCP. Return precautions discussed.  BP 132/84 mmHg  Pulse 90  Temp(Src) 98.2 F (36.8 C) (Oral)  Resp 18  Ht 5\' 8"  (1.727 m)  Wt 300 lb (136.079 kg)  BMI 45.63 kg/m2  SpO2 95%       Marella Chimes, PA-C 06/24/15 [place], MD 06/25/15 [phone]

## 2015-06-24 NOTE — ED Notes (Signed)
CHECKED CBG 183

## 2015-06-24 NOTE — ED Provider Notes (Signed)
Patient had flu vaccine 5 days ago. He reports that 3 days ago he developed diffuse myalgias and slight cough. He has felt steadily better each day. He gets similar symptoms each here after getting flu vaccine. On exam patient is no distress lungs clear auscultation abdomen obese nontender. Right upper externa with dialysis fistula with good thrill. Doubt pneumonia. Favor atelectasis patient denies fever cough is minimal. No leukocytosis  Orlie Dakin, MD 06/24/15 [phone]

## 2015-07-03 ENCOUNTER — Encounter (HOSPITAL_COMMUNITY): Payer: Self-pay | Admitting: *Deleted

## 2015-07-03 ENCOUNTER — Inpatient Hospital Stay (HOSPITAL_COMMUNITY)
Admission: EM | Admit: 2015-07-03 | Discharge: 2015-07-14 | DRG: 871 | Disposition: A | Discharge status: Home Health | Payer: Medicare Other | Attending: Internal Medicine | Admitting: Internal Medicine

## 2015-07-03 ENCOUNTER — Emergency Department (HOSPITAL_COMMUNITY): Payer: Medicare Other

## 2015-07-03 DIAGNOSIS — G4733 Obstructive sleep apnea (adult) (pediatric): Secondary | ICD-10-CM | POA: Diagnosis present

## 2015-07-03 DIAGNOSIS — D631 Anemia in chronic kidney disease: Secondary | ICD-10-CM | POA: Diagnosis present

## 2015-07-03 DIAGNOSIS — Z7982 Long term (current) use of aspirin: Secondary | ICD-10-CM | POA: Diagnosis not present

## 2015-07-03 DIAGNOSIS — K029 Dental caries, unspecified: Secondary | ICD-10-CM | POA: Diagnosis present

## 2015-07-03 DIAGNOSIS — E1129 Type 2 diabetes mellitus with other diabetic kidney complication: Secondary | ICD-10-CM | POA: Diagnosis not present

## 2015-07-03 DIAGNOSIS — R059 Cough, unspecified: Secondary | ICD-10-CM

## 2015-07-03 DIAGNOSIS — E119 Type 2 diabetes mellitus without complications: Secondary | ICD-10-CM

## 2015-07-03 DIAGNOSIS — J069 Acute upper respiratory infection, unspecified: Secondary | ICD-10-CM | POA: Diagnosis present

## 2015-07-03 DIAGNOSIS — K089 Disorder of teeth and supporting structures, unspecified: Secondary | ICD-10-CM

## 2015-07-03 DIAGNOSIS — Z794 Long term (current) use of insulin: Secondary | ICD-10-CM

## 2015-07-03 DIAGNOSIS — D591 Other autoimmune hemolytic anemias: Secondary | ICD-10-CM | POA: Diagnosis present

## 2015-07-03 DIAGNOSIS — R7881 Bacteremia: Principal | ICD-10-CM | POA: Diagnosis present

## 2015-07-03 DIAGNOSIS — E875 Hyperkalemia: Secondary | ICD-10-CM | POA: Diagnosis present

## 2015-07-03 DIAGNOSIS — N186 End stage renal disease: Secondary | ICD-10-CM | POA: Diagnosis not present

## 2015-07-03 DIAGNOSIS — D649 Anemia, unspecified: Secondary | ICD-10-CM | POA: Diagnosis not present

## 2015-07-03 DIAGNOSIS — R05 Cough: Secondary | ICD-10-CM | POA: Diagnosis not present

## 2015-07-03 DIAGNOSIS — J40 Bronchitis, not specified as acute or chronic: Secondary | ICD-10-CM | POA: Diagnosis not present

## 2015-07-03 DIAGNOSIS — E662 Morbid (severe) obesity with alveolar hypoventilation: Secondary | ICD-10-CM | POA: Diagnosis present

## 2015-07-03 DIAGNOSIS — R0600 Dyspnea, unspecified: Secondary | ICD-10-CM | POA: Diagnosis not present

## 2015-07-03 DIAGNOSIS — N2581 Secondary hyperparathyroidism of renal origin: Secondary | ICD-10-CM | POA: Diagnosis present

## 2015-07-03 DIAGNOSIS — E1165 Type 2 diabetes mellitus with hyperglycemia: Secondary | ICD-10-CM | POA: Diagnosis present

## 2015-07-03 DIAGNOSIS — I120 Hypertensive chronic kidney disease with stage 5 chronic kidney disease or end stage renal disease: Secondary | ICD-10-CM | POA: Diagnosis present

## 2015-07-03 DIAGNOSIS — B957 Other staphylococcus as the cause of diseases classified elsewhere: Secondary | ICD-10-CM | POA: Diagnosis not present

## 2015-07-03 DIAGNOSIS — Z6841 Body Mass Index (BMI) 40.0 and over, adult: Secondary | ICD-10-CM | POA: Diagnosis not present

## 2015-07-03 DIAGNOSIS — E1122 Type 2 diabetes mellitus with diabetic chronic kidney disease: Secondary | ICD-10-CM | POA: Diagnosis present

## 2015-07-03 DIAGNOSIS — Z833 Family history of diabetes mellitus: Secondary | ICD-10-CM | POA: Diagnosis not present

## 2015-07-03 DIAGNOSIS — K088 Other specified disorders of teeth and supporting structures: Secondary | ICD-10-CM | POA: Diagnosis not present

## 2015-07-03 DIAGNOSIS — Z992 Dependence on renal dialysis: Secondary | ICD-10-CM

## 2015-07-03 DIAGNOSIS — E871 Hypo-osmolality and hyponatremia: Secondary | ICD-10-CM | POA: Diagnosis present

## 2015-07-03 DIAGNOSIS — E1159 Type 2 diabetes mellitus with other circulatory complications: Secondary | ICD-10-CM | POA: Diagnosis present

## 2015-07-03 DIAGNOSIS — D5912 Cold autoimmune hemolytic anemia: Secondary | ICD-10-CM | POA: Diagnosis present

## 2015-07-03 DIAGNOSIS — R0602 Shortness of breath: Secondary | ICD-10-CM | POA: Diagnosis not present

## 2015-07-03 DIAGNOSIS — I10 Essential (primary) hypertension: Secondary | ICD-10-CM

## 2015-07-03 LAB — CBC WITH DIFFERENTIAL/PLATELET
BASOS PCT: 1 %
Basophils Absolute: 0.2 10*3/uL — ABNORMAL HIGH (ref 0.0–0.1)
EOS ABS: 0.9 10*3/uL — AB (ref 0.0–0.7)
Eosinophils Relative: 5 %
HCT: 21.2 % — ABNORMAL LOW (ref 39.0–52.0)
Hemoglobin: 7.5 g/dL — ABNORMAL LOW (ref 13.0–17.0)
LYMPHS PCT: 12 %
Lymphs Abs: 2.2 10*3/uL (ref 0.7–4.0)
MCH: 34.9 pg — ABNORMAL HIGH (ref 26.0–34.0)
MCHC: 35.4 g/dL (ref 30.0–36.0)
MCV: 98.6 fL (ref 78.0–100.0)
MONO ABS: 0.7 10*3/uL (ref 0.1–1.0)
Monocytes Relative: 4 %
NEUTROS PCT: 78 %
Neutro Abs: 14.4 10*3/uL — ABNORMAL HIGH (ref 1.7–7.7)
PLATELETS: 501 10*3/uL — AB (ref 150–400)
RBC: 2.15 MIL/uL — AB (ref 4.22–5.81)
RDW: 18.6 % — AB (ref 11.5–15.5)
WBC: 18.4 10*3/uL — AB (ref 4.0–10.5)

## 2015-07-03 LAB — BASIC METABOLIC PANEL
Anion gap: 15 (ref 5–15)
BUN: 37 mg/dL — ABNORMAL HIGH (ref 6–20)
CALCIUM: 9.5 mg/dL (ref 8.9–10.3)
CO2: 27 mmol/L (ref 22–32)
CREATININE: 5.48 mg/dL — AB (ref 0.61–1.24)
Chloride: 88 mmol/L — ABNORMAL LOW (ref 101–111)
GFR, EST AFRICAN AMERICAN: 13 mL/min — AB (ref >60)
GFR, EST NON AFRICAN AMERICAN: 11 mL/min — AB (ref >60)
Glucose, Bld: 258 mg/dL — ABNORMAL HIGH (ref 65–99)
Potassium: 4.2 mmol/L (ref 3.5–5.1)
SODIUM: 130 mmol/L — AB (ref 135–145)

## 2015-07-03 LAB — MRSA PCR SCREENING: MRSA BY PCR: NEGATIVE

## 2015-07-03 LAB — GLUCOSE, CAPILLARY
Glucose-Capillary: 407 mg/dL — ABNORMAL HIGH (ref 65–99)
Glucose-Capillary: 522 mg/dL — ABNORMAL HIGH (ref 65–99)

## 2015-07-03 LAB — I-STAT TROPONIN, ED: TROPONIN I, POC: 0 ng/mL (ref 0.00–0.08)

## 2015-07-03 MED ORDER — CALCIUM ACETATE 667 MG PO CAPS: 1334.0000 mg | ORAL_CAPSULE | Freq: Three times a day (TID) | ORAL | Status: DC

## 2015-07-03 MED ORDER — DIAZEPAM 2 MG PO TABS: 2.0000 mg | ORAL_TABLET | Freq: Three times a day (TID) | ORAL | Status: DC | PRN

## 2015-07-03 MED ORDER — IPRATROPIUM BROMIDE 0.02 % IN SOLN
0.5000 mg | Freq: Once | RESPIRATORY_TRACT | Status: AC
  Administered 2015-07-03: 0.5 mg via RESPIRATORY_TRACT
  Filled 2015-07-03: qty 2.5

## 2015-07-03 MED ORDER — ASPIRIN EC 81 MG PO TBEC
81.0000 mg | DELAYED_RELEASE_TABLET | Freq: Every day | ORAL | Status: DC
  Administered 2015-07-04 – 2015-07-14 (×11): 81 mg via ORAL
  Filled 2015-07-03 (×12): qty 1

## 2015-07-03 MED ORDER — PREDNISONE 20 MG PO TABS
60.0000 mg | ORAL_TABLET | Freq: Once | ORAL | Status: AC
  Administered 2015-07-03: 60 mg via ORAL
  Filled 2015-07-03: qty 3

## 2015-07-03 MED ORDER — INSULIN ASPART 100 UNIT/ML ~~LOC~~ SOLN
9.0000 [IU] | Freq: Once | SUBCUTANEOUS | Status: AC
Start: 2015-07-03 — End: 2015-07-03
  Administered 2015-07-03: 9 [IU] via SUBCUTANEOUS

## 2015-07-03 MED ORDER — ALBUTEROL SULFATE (2.5 MG/3ML) 0.083% IN NEBU: 2.5000 mg | INHALATION_SOLUTION | RESPIRATORY_TRACT | Status: DC | PRN

## 2015-07-03 MED ORDER — ALBUTEROL SULFATE (2.5 MG/3ML) 0.083% IN NEBU
5.0000 mg | INHALATION_SOLUTION | Freq: Once | RESPIRATORY_TRACT | Status: AC
  Administered 2015-07-03: 5 mg via RESPIRATORY_TRACT
  Filled 2015-07-03: qty 6

## 2015-07-03 MED ORDER — ALBUTEROL SULFATE (2.5 MG/3ML) 0.083% IN NEBU
2.5000 mg | INHALATION_SOLUTION | Freq: Four times a day (QID) | RESPIRATORY_TRACT | Status: DC | PRN
Start: 2015-07-03 — End: 2015-07-14

## 2015-07-03 MED ORDER — ENOXAPARIN SODIUM 30 MG/0.3ML ~~LOC~~ SOLN
30.0000 mg | SUBCUTANEOUS | Status: DC
  Administered 2015-07-03 – 2015-07-08 (×6): 30 mg via SUBCUTANEOUS
  Filled 2015-07-03 (×7): qty 0.3

## 2015-07-03 MED ORDER — PNEUMOCOCCAL VAC POLYVALENT 25 MCG/0.5ML IJ INJ
0.5000 mL | INJECTION | INTRAMUSCULAR | Status: DC
  Filled 2015-07-03: qty 0.5

## 2015-07-03 MED ORDER — ASPIRIN EC 81 MG PO TBEC
81.0000 mg | DELAYED_RELEASE_TABLET | Freq: Every day | ORAL | Status: DC
  Administered 2015-07-03: 81 mg via ORAL

## 2015-07-03 MED ORDER — INSULIN ASPART 100 UNIT/ML ~~LOC~~ SOLN
0.0000 [IU] | Freq: Three times a day (TID) | SUBCUTANEOUS | Status: DC
  Administered 2015-07-04: 7 [IU] via SUBCUTANEOUS
  Administered 2015-07-04 – 2015-07-05 (×2): 3 [IU] via SUBCUTANEOUS
  Administered 2015-07-05 (×2): 7 [IU] via SUBCUTANEOUS

## 2015-07-03 MED ORDER — GUAIFENESIN 200 MG PO TABS
200.0000 mg | ORAL_TABLET | Freq: Once | ORAL | Status: AC
  Administered 2015-07-03: 200 mg via ORAL
  Filled 2015-07-03: qty 1

## 2015-07-03 MED ORDER — CALCIUM ACETATE (PHOS BINDER) 667 MG PO CAPS: 1334.0000 mg | ORAL_CAPSULE | Freq: Three times a day (TID) | ORAL | Status: DC

## 2015-07-03 MED ORDER — IPRATROPIUM-ALBUTEROL 0.5-2.5 (3) MG/3ML IN SOLN: 3.0000 mL | RESPIRATORY_TRACT | Status: DC | PRN

## 2015-07-03 MED ORDER — PREDNISONE 20 MG PO TABS: 40.0000 mg | ORAL_TABLET | Freq: Every day | ORAL | Status: DC

## 2015-07-03 NOTE — ED Notes (Signed)
Attempted report 

## 2015-07-03 NOTE — ED Provider Notes (Signed)
CSN: [id number]     Arrival date & time 07/03/15  1154 History   First MD Initiated Contact with Patient 07/03/15 1221     Chief Complaint  Patient presents with  . Shortness of Breath     (Consider location/radiation/quality/duration/timing/severity/associated sxs/prior Treatment) HPI   Mr. Ketelsen is a 49 year old male with history of diabetes, hypertension, end-stage renal disease with M, W, F dialysis, and OSA, he presents to the emergency department for increasing shortness of breath with cough which he has had for several weeks. On September 14 he was evaluated for cough and myalgias that occurred after getting a flu vaccine. Chart documentation states his lungs were clear with physical exam, and he was sent home with supportive treatment. He states that his symptoms have gradually worsened with productive cough with clear sputum, progressive exertional shortness of breath and generalized fatigue.  He denies any smoking history.  He denies any chest pain, lower extremity edema, orthopnea, PND abdominal pain, nausea, vomiting or diarrhea.    Past Medical History  Diagnosis Date  . Diabetes mellitus   . Hypertension   . Chronic kidney disease   . 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 Sleep apnea    Past Surgical History  Procedure Laterality Date  . Av fistula placement  05/03/2012    Procedure: ARTERIOVENOUS (AV) FISTULA CREATION;  Surgeon: Mal [name], MD;  Location: [place];  Service: Vascular;  Laterality: Right;  . Tracheostomy tube placement N/A 08/20/2013    Procedure: TRACHEOSTOMY Revision;  Surgeon: Melida Quitter, MD;  Location: [facility] OR;  Service: ENT;  Laterality: N/A;   Family History  Problem Relation Age of Onset  . Diabetes Mother   . Diabetes Sister   . Diabetes Brother    Social History  Substance Use Topics  . Smoking status: Never Smoker   . Smokeless tobacco: Never Used  . Alcohol Use: 0.6 oz/week    1  Cans of beer per week    Review of Systems  Constitutional: Positive for activity change and fatigue. Negative for fever and unexpected weight change.  HENT: Negative.   Eyes: Negative.   Respiratory: Positive for cough and shortness of breath. Negative for apnea, chest tightness, wheezing and stridor.   Cardiovascular: Negative for chest pain, palpitations and leg swelling.  Gastrointestinal: Negative.   Endocrine: Negative.   Genitourinary: Negative.   Neurological: Positive for dizziness, weakness and light-headedness. Negative for tremors, seizures, syncope, facial asymmetry, speech difficulty, numbness and headaches.  Hematological: Negative.   Psychiatric/Behavioral: Negative.    10 Systems reviewed and are negative for acute change except as noted in the HPI.      Allergies  Review of patient's allergies indicates no known allergies.  Home Medications   Prior to Admission medications   Medication Sig Start Date End Date Taking? Authorizing Provider  amLODipine (NORVASC) 10 MG tablet Take 1 tablet (10 mg total) by mouth daily. 09/19/13  Yes Donita Brooks, NP  aspirin EC 81 MG tablet Take 81 mg by mouth daily.   Yes Historical Provider, MD  calcium acetate (PHOSLO) 667 MG capsule Take 2 capsules (1,334 mg total) by mouth 3 (three) times daily with meals. 09/19/13  Yes Donita Brooks, NP  diazepam (VALIUM) 2 MG tablet Take 1 tablet (2 mg total) by mouth every 8 (eight) hours as needed for muscle spasms. 06/17/15  Yes Loleta Chance, MD  insulin aspart (NOVOLOG)  100 UNIT/ML injection Inject 0-15 Units into the skin 3 (three) times daily with meals. CBG < 70: drink orange juice and recheck glucose or use glucose tabs CBG > 400: call MD   CBG 70 - 120: 0 units CBG 121 - 150: 3 units CBG 151 - 200: 4 units CBG 201 - 250: 7 units CBG 251 - 300: 11 units CBG 301 - 350: 15 units CBG 351 - 400: 20 units 09/19/13  Yes Donita Brooks, NP  Vitamin D, Ergocalciferol, (DRISDOL) 50000  UNITS CAPS capsule Take 1 capsule (50,000 Units total) by mouth every 7 (seven) days. *takes on Friday* 09/19/13  Yes Donita Brooks, NP   BP 102/79 mmHg  Pulse 105  Temp(Src) 97.8 F (36.6 C) (Oral)  Resp 22  Wt 302 lb 0.5 oz (137 kg)  SpO2 94% Physical Exam  Constitutional: He is oriented to person, place, and time. He appears well-developed and well-nourished. No distress.  Obese, chronically ill-appearing male  HENT:  Head: Normocephalic and atraumatic.  Nose: Nose normal.  Mouth/Throat: Oropharynx is clear and moist. No oropharyngeal exudate.  Eyes: Conjunctivae and EOM are normal. Pupils are equal, round, and reactive to light. Right eye exhibits no discharge. Left eye exhibits no discharge. No scleral icterus.  Neck: Normal range of motion. No JVD present. No tracheal deviation present. No thyromegaly present.  Cardiovascular: Normal rate, regular rhythm, normal heart sounds and intact distal pulses.  Exam reveals no gallop and no friction rub.   No murmur heard. Pulmonary/Chest: Effort normal. He has wheezes. He has no rales. He exhibits no tenderness.  Tachypnea, diffuse rhonchi and expiratory wheeze, no chest wall tenderness, no rales  Abdominal: Soft. Bowel sounds are normal. He exhibits no distension and no mass. There is no tenderness. There is no rebound and no guarding.  Musculoskeletal: Normal range of motion. He exhibits no edema or tenderness.  Lymphadenopathy:    He has no cervical adenopathy.  Neurological: He is alert and oriented to person, place, and time. He has normal reflexes. No cranial nerve deficit. He exhibits normal muscle tone. Coordination normal.  Skin: Skin is warm. No rash noted. He is diaphoretic. No erythema. No pallor.  Psychiatric: He has a normal mood and affect. His behavior is normal. Judgment and thought content normal.  Nursing note and vitals reviewed.   ED Course  Procedures (including critical care time) Labs Review Labs Reviewed   BASIC METABOLIC PANEL - Abnormal; Notable for the following:    Sodium 130 (*)    Chloride 88 (*)    Glucose, Bld 258 (*)    BUN 37 (*)    Creatinine, Ser 5.48 (*)    GFR calc non Af Amer 11 (*)    GFR calc Af Amer 13 (*)    All other components within normal limits  CBC WITH DIFFERENTIAL/PLATELET  Randolm Idol, ED    Imaging Review Dg Chest 2 View  07/03/2015   CLINICAL DATA:  Dry cough for approximately 10 days. Hypotension after dialysis. Tremors today.  EXAM: CHEST  2 VIEW  COMPARISON:  PA and lateral chest 06/24/2015.  FINDINGS: Right basilar atelectasis seen on the comparison examination has improved. The left lung is clear. Heart size is upper normal. No pneumothorax or pleural effusion.  IMPRESSION: No acute disease.  Improved right basilar atelectasis.   Electronically Signed   By: Inge Rise M.D.   On: 07/03/2015 12:40   I have personally reviewed and evaluated these images and lab  results as part of my medical decision-making.   EKG Interpretation None      MDM   Final diagnoses:  None   Patient with shortness of breath, cough, orthopnea, exertional dyspnea, generalized fatigue Chest x-ray compared to one on September 14, he had improved atelectasis versus infiltrate in the RLLwithout antibiotic treatment. On exam he is diffuse rhonchi and wheeze, and clinical worsening. He presented with mild tachycardia, patient is afebrile. He complains of exertional dyspnea generalized fatigue, and also during dialysis today he states he was hypotensive and was lightheaded.  After multiple DuoNeb, patient's breath sounds are unchanged however he did report decreased tightness in his chest, and he ambulated without becoming hypoxic lab results still pending due to difficult lab draw  Labs are significant for leukocytosis of 18.4, anemia 7.5, mild hyponatremia, and mild hyperglycemia Patient is not able to be ruled out for PE with [facility], May need CT angio, however his  dyspnea and tachycardia may also be due to bronchitis and anemia.  Call was sent out to internal medicine for admission for further treatment and workup.         Delsa Grana, PA-C 07/10/15 AN:[id number]  Charlesetta Shanks, MD 07/22/15 [phone]

## 2015-07-03 NOTE — ED Notes (Signed)
Checked pulse oximetry while ambulating, pt remained above 90% the entire time. EDP aware. No significant distress noted and pt reports it felt good to walk around.

## 2015-07-03 NOTE — H&P (Signed)
Date: 07/03/2015               Patient Name:  Seth [name] MRN: [id number]  DOB: 14-Aug-1966 Age / Sex: 49 y.o., male   PCP: Tresa Garter, MD         Medical Service: Internal Medicine Teaching Service         Attending Physician: Dr. Raeanne Gathers, MD    First Contact: Dr. Jule Ser Pager: [phone]  Second Contact: Dr. Dellia Nims Pager: [phone]       After Hours (After 5p/  First Contact Pager: [phone]  weekends / holidays): Second Contact Pager: [phone]   Chief Complaint: shortness of breath  History of Present Illness: Seth [name] is a 49 y.o. AA male with past medical history of DM type II, HTN, ESRD with MWF HD, and OSA who presents to the ED with 2.5 weeks of feeling bad with what he describes as cold like symptoms, runny nose.  Denies any fever.  Also states that since this time he has had a productive cough with clear-like mucus.  Denies any blood in his sputum.  States all these symptoms began around the time he got his flu shot.  Today, he was feeling more sluggish than normal as well as dizzy, lightheaded, and short of breath.  He did have HD today and states they took off an extra 2 kilos from his dry weight.  He has been taking Azithromycin since Monday for these symptoms that he received at his dialysis appointment.  States that he thinks it has helped.  Did not take dose today which was to be his last dose.  Denies any chest pain, fever, chills, n/v/d, urinary complaints.  Only symptoms are as described above.  Meds: Current Facility-Administered Medications  Medication Dose Route Frequency Provider Last Rate Last Dose  . aspirin EC tablet 81 mg  81 mg Oral Daily Tasrif Ahmed, MD      . Derrill Memo ON 07/04/2015] calcium acetate (PHOSLO) capsule 1,334 mg  1,334 mg Oral TID WC Tasrif Ahmed, MD      . diazepam (VALIUM) tablet 2 mg  2 mg Oral Q8H PRN Tasrif Ahmed, MD      . Derrill Memo ON 07/04/2015] insulin aspart (novoLOG) injection 0-9 Units  0-9 Units  Subcutaneous TID WC Tasrif Ahmed, MD       Current Outpatient Prescriptions  Medication Sig Dispense Refill  . amLODipine (NORVASC) 10 MG tablet Take 1 tablet (10 mg total) by mouth daily. 30 tablet 0  . aspirin EC 81 MG tablet Take 81 mg by mouth daily.    . calcium acetate (PHOSLO) 667 MG capsule Take 2 capsules (1,334 mg total) by mouth 3 (three) times daily with meals. 180 capsule 0  . diazepam (VALIUM) 2 MG tablet Take 1 tablet (2 mg total) by mouth every 8 (eight) hours as needed for muscle spasms. 30 tablet 0  . insulin aspart (NOVOLOG) 100 UNIT/ML injection Inject 0-15 Units into the skin 3 (three) times daily with meals. CBG < 70: drink orange juice and recheck glucose or use glucose tabs CBG > 400: call MD   CBG 70 - 120: 0 units CBG 121 - 150: 3 units CBG 151 - 200: 4 units CBG 201 - 250: 7 units CBG 251 - 300: 11 units CBG 301 - 350: 15 units CBG 351 - 400: 20 units 1 vial 12  . Vitamin D, Ergocalciferol, (DRISDOL) 50000 UNITS CAPS capsule Take 1 capsule (50,000  Units total) by mouth every 7 (seven) days. *takes on Friday* 30 capsule 0  . albuterol (PROVENTIL) (2.5 MG/3ML) 0.083% nebulizer solution Take 3 mLs (2.5 mg total) by nebulization every 6 (six) hours as needed for wheezing or shortness of breath. 75 mL 12  . predniSONE (DELTASONE) 20 MG tablet Take 2 tablets (40 mg total) by mouth daily. 10 tablet 0    Allergies: Allergies as of 07/03/2015  . (No Known Allergies)   Past Medical History  Diagnosis Date  . Diabetes mellitus   . Hypertension   . Chronic kidney disease   . 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 Sleep apnea    Past Surgical History  Procedure Laterality Date  . Av fistula placement  05/03/2012    Procedure: ARTERIOVENOUS (AV) FISTULA CREATION;  Surgeon: Mal [name], MD;  Location: [facility];  Service: Vascular;  Laterality: Right;  . Tracheostomy tube placement N/A 08/20/2013    Procedure:  TRACHEOSTOMY Revision;  Surgeon: Melida Quitter, MD;  Location: [facility];  Service: ENT;  Laterality: N/A;   Family History  Problem Relation Age of Onset  . Diabetes Mother   . Diabetes Sister   . Diabetes Brother    Social History   Social History  . Marital Status: Single    Spouse Name: N/A  . Number of Children: N/A  . Years of Education: N/A   Occupational History  . Not on file.   Social History Main Topics  . Smoking status: Never Smoker   . Smokeless tobacco: Never Used  . Alcohol Use: 0.6 oz/week    1 Cans of beer per week  . Drug Use: Yes    Special: Marijuana     Comment: ocassionaly   . Sexual Activity: Not Currently   Other Topics Concern  . 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  Divorce with 5 children, 3 boys and 2 girls.  Lives in [place].     Review of Systems: Pertinent items are noted in HPI.  Physical Exam: Blood pressure 151/92, pulse 103, temperature 97.8 F (36.6 C), temperature source Oral, resp. rate 20, weight 302 lb 0.5 oz (137 kg), SpO2 92 %.  Filed Vitals:   07/03/15 1700 07/03/15 1730 07/03/15 1800 07/03/15 1830  BP:  138/72 141/83 151/92  Pulse: 103 102 104 103  Temp:      TempSrc:      Resp: 25 24 20    Weight:      SpO2: 100% 93% 95% 92%   Head: Normocephalic and atraumatic. Eyes: EOMI, conjunctivae normal, No scleral icterus.  Neck: Supple Cardiovascular: mildly tachycardic, regular rhythm, S1 normal, S2 normal, no murmurs, gallops, or rubs. Pulmonary/Chest: Air entry equal bilaterally, scattered wheezes and rhonchi Abdominal: Soft, non-tender, non-distended, BS +, no masses, organomegaly, or guarding present.  Extremities: No swelling or edema,  pulses symmetric and intact bilaterally. No cyanosis or clubbing. Neurological: A&O x3, Strength is normal and  symmetric bilaterally, cranial nerve II-XII are grossly intact, no focal motor deficit, sensory intact to light touch bilaterally.  Skin: Warm, dry and intact. No rashes or erythema. Psychiatric: Normal mood and affect. speech and behavior is normal. Cognition and memory are normal.    Lab results: Basic Metabolic Panel:  Recent Labs  07/03/15 1206  NA 130*  K 4.2  CL 88*  CO2 27  GLUCOSE 258*  BUN 37*  CREATININE 5.48*  CALCIUM 9.5   Liver Function Tests: No results for input(s): AST, ALT, ALKPHOS, BILITOT, PROT, ALBUMIN in the last 72 hours. No results for input(s): LIPASE, AMYLASE in the last 72 hours. No results for input(s): AMMONIA in the last 72 hours. CBC:  Recent Labs  07/03/15 1455  WBC 18.4*  NEUTROABS 14.4*  HGB 7.5*  HCT 21.2*  MCV 98.6  PLT 501*   Cardiac Enzymes: No results for input(s): CKTOTAL, CKMB, CKMBINDEX, TROPONINI in the last 72 hours. BNP: No results for input(s): PROBNP in the last 72 hours. D-Dimer: No results for input(s): DDIMER in the last 72 hours. CBG: No results for input(s): GLUCAP in the last 72 hours. Hemoglobin A1C: No results for input(s): HGBA1C in the last 72 hours. Fasting Lipid Panel: No results for input(s): CHOL, HDL, LDLCALC, TRIG, CHOLHDL, LDLDIRECT in the last 72 hours. Thyroid Function Tests: No results for input(s): TSH, T4TOTAL, FREET4, T3FREE, THYROIDAB in the last 72 hours. Anemia Panel: No results for input(s): VITAMINB12, FOLATE, FERRITIN, TIBC, IRON, RETICCTPCT in the last 72 hours. Coagulation: No results for input(s): LABPROT, INR in the last 72 hours. Urine Drug Screen: Drugs of Abuse  No results found for: LABOPIA, COCAINSCRNUR, LABBENZ, AMPHETMU, THCU, LABBARB  Alcohol Level: No results for input(s): ETH in the last 72 hours. Urinalysis: No results for input(s): COLORURINE, LABSPEC, PHURINE, GLUCOSEU, HGBUR, BILIRUBINUR, KETONESUR, PROTEINUR, UROBILINOGEN, NITRITE, LEUKOCYTESUR in the last 72  hours.  Invalid input(s): APPERANCEUR   Imaging results:  Dg Chest 2 View  07/03/2015   CLINICAL DATA:  Dry cough for approximately 10 days. Hypotension after dialysis. Tremors today.  EXAM: CHEST  2 VIEW  COMPARISON:  PA and lateral chest 06/24/2015.  FINDINGS: Right basilar atelectasis seen on the comparison examination has improved. The left lung is clear. Heart size is upper normal. No pneumothorax or pleural effusion.  IMPRESSION: No acute disease.  Improved right basilar atelectasis.   Electronically Signed   By: Inge Rise M.D.   On: 07/03/2015 12:40    Other results: EKG: Sinus tachycardia, Minimal voltage criteria for LVH  Assessment & Plan by Problem: Active Problems:   DM II (diabetes mellitus, type II), controlled   Essential hypertension   End stage renal disease on dialysis   Normocytic anemia   OSA (obstructive sleep apnea)   Shortness of breath  49 y.o. AA male with past medical history of DM type II, HTN, ESRD with MWF HD, and OSA who presents to the ED with 2.5 weeks of feeling bad with what he describes as cold like symptoms, runny nose.  Denies any fever.  Shortness of breath -chest x-ray today compared to 9/14 shows improved right bibasilar atelectasis, leukocytosis of 18.4, Hgb 7.5.  Breathing improved with breathing treatments and he was able to ambulate in the ED without hypoxia.  Suspect shortness of breath likely due to bronchitis or anemia.  Do not suspect PE as cause of his dyspnea or tachycardia with his Wells score for PE of 1.5 being low risk.   -received albuterol, duonebs, and steroids in the ED -continue Duonebs q4h prn -CBC in AM  Diabetes mellitus -glucose 258 on admission sliding scale insulin -check a1c  Hypertension -Holding amlodipine  Mild hyponatremia -130 on admission, corrected to 134 when accounting for hyperglycemia -BMP in the morning  OSA -CPAP   Dispo:  Disposition is deferred at this time, awaiting improvement of  current medical problems.   The patient does have a current PCP (Tresa Garter, MD) and does not need an [facility] hospital follow-up appointment after discharge.  The patient does not have transportation limitations that hinder transportation to clinic appointments.  Signed: Jule Ser, DO 07/03/2015, 7:24 PM

## 2015-07-03 NOTE — ED Notes (Signed)
Pt states that he has SOB, Dizziness and cough. Pt state that this has been ongoing for several weeks. Pt reports last dialysis today

## 2015-07-04 LAB — CBC WITH DIFFERENTIAL/PLATELET
BASOS PCT: 0 %
Basophils Absolute: 0 10*3/uL (ref 0.0–0.1)
EOS ABS: 0 10*3/uL (ref 0.0–0.7)
Eosinophils Relative: 0 %
HCT: 18 % — ABNORMAL LOW (ref 39.0–52.0)
Hemoglobin: 6.8 g/dL — CL (ref 13.0–17.0)
Lymphocytes Relative: 9 %
Lymphs Abs: 2.6 10*3/uL (ref 0.7–4.0)
MCH: 37.8 pg — AB (ref 26.0–34.0)
MCHC: 37.8 g/dL — AB (ref 30.0–36.0)
MCV: 100 fL (ref 78.0–100.0)
MONO ABS: 1.4 10*3/uL — AB (ref 0.1–1.0)
Monocytes Relative: 5 %
NEUTROS ABS: 24.4 10*3/uL — AB (ref 1.7–7.7)
NEUTROS PCT: 86 %
RBC: 1.8 MIL/uL — ABNORMAL LOW (ref 4.22–5.81)
WBC: 28.4 10*3/uL — ABNORMAL HIGH (ref 4.0–10.5)

## 2015-07-04 LAB — BASIC METABOLIC PANEL
ANION GAP: 19 — AB (ref 5–15)
Anion gap: 19 — ABNORMAL HIGH (ref 5–15)
Anion gap: 18 — ABNORMAL HIGH (ref 5–15)
BUN: 80 mg/dL — AB (ref 6–20)
BUN: 83 mg/dL — ABNORMAL HIGH (ref 6–20)
BUN: 63 mg/dL — AB (ref 6–20)
CALCIUM: 9.3 mg/dL (ref 8.9–10.3)
CALCIUM: 9.1 mg/dL (ref 8.9–10.3)
CHLORIDE: 84 mmol/L — AB (ref 101–111)
CHLORIDE: 87 mmol/L — AB (ref 101–111)
CO2: 22 mmol/L (ref 22–32)
CO2: 23 mmol/L (ref 22–32)
CO2: 23 mmol/L (ref 22–32)
CREATININE: 8.22 mg/dL — AB (ref 0.61–1.24)
CREATININE: 8.67 mg/dL — AB (ref 0.61–1.24)
CREATININE: 6.76 mg/dL — AB (ref 0.61–1.24)
Calcium: 9.6 mg/dL (ref 8.9–10.3)
Chloride: 82 mmol/L — ABNORMAL LOW (ref 101–111)
GFR calc Af Amer: 8 mL/min — ABNORMAL LOW (ref >60)
GFR calc non Af Amer: 7 mL/min — ABNORMAL LOW (ref >60)
GFR, EST AFRICAN AMERICAN: 7 mL/min — AB (ref >60)
GFR, EST AFRICAN AMERICAN: 10 mL/min — AB (ref >60)
GFR, EST NON AFRICAN AMERICAN: 6 mL/min — AB (ref >60)
GFR, EST NON AFRICAN AMERICAN: 8 mL/min — AB (ref >60)
Glucose, Bld: 402 mg/dL — ABNORMAL HIGH (ref 65–99)
Glucose, Bld: 370 mg/dL — ABNORMAL HIGH (ref 65–99)
Glucose, Bld: 299 mg/dL — ABNORMAL HIGH (ref 65–99)
Potassium: 6.6 mmol/L (ref 3.5–5.1)
Potassium: 4.7 mmol/L (ref 3.5–5.1)
Potassium: 3.5 mmol/L (ref 3.5–5.1)
SODIUM: 125 mmol/L — AB (ref 135–145)
SODIUM: 124 mmol/L — AB (ref 135–145)
SODIUM: 128 mmol/L — AB (ref 135–145)

## 2015-07-04 LAB — OCCULT BLOOD X 1 CARD TO LAB, STOOL: Fecal Occult Bld: NEGATIVE

## 2015-07-04 LAB — CBC
HEMATOCRIT: 17.7 % — AB (ref 39.0–52.0)
Hemoglobin: 6.4 g/dL — CL (ref 13.0–17.0)
MCH: 34 pg (ref 26.0–34.0)
MCHC: 36.2 g/dL — ABNORMAL HIGH (ref 30.0–36.0)
MCV: 94.1 fL (ref 78.0–100.0)
Platelets: 530 10*3/uL — ABNORMAL HIGH (ref 150–400)
RBC: 1.88 MIL/uL — ABNORMAL LOW (ref 4.22–5.81)
RDW: 16.9 % — AB (ref 11.5–15.5)
WBC: 30.1 10*3/uL — AB (ref 4.0–10.5)

## 2015-07-04 LAB — IRON AND TIBC
IRON: 193 ug/dL — AB (ref 45–182)
SATURATION RATIOS: 87 % — AB (ref 17.9–39.5)
TIBC: 221 ug/dL — AB (ref 250–450)
UIBC: 28 ug/dL

## 2015-07-04 LAB — GLUCOSE, CAPILLARY
GLUCOSE-CAPILLARY: 317 mg/dL — AB (ref 65–99)
GLUCOSE-CAPILLARY: 453 mg/dL — AB (ref 65–99)
Glucose-Capillary: 364 mg/dL — ABNORMAL HIGH (ref 65–99)
Glucose-Capillary: 371 mg/dL — ABNORMAL HIGH (ref 65–99)
Glucose-Capillary: 229 mg/dL — ABNORMAL HIGH (ref 65–99)
Glucose-Capillary: 347 mg/dL — ABNORMAL HIGH (ref 65–99)

## 2015-07-04 LAB — RETICULOCYTES
RBC.: 1.88 MIL/uL — AB (ref 4.22–5.81)
RETIC COUNT ABSOLUTE: 77.1 10*3/uL (ref 19.0–186.0)
Retic Ct Pct: 4.1 % — ABNORMAL HIGH (ref 0.4–3.1)

## 2015-07-04 LAB — HEPATIC FUNCTION PANEL
ALT: 19 U/L (ref 17–63)
AST: 19 U/L (ref 15–41)
Albumin: 3.2 g/dL — ABNORMAL LOW (ref 3.5–5.0)
Alkaline Phosphatase: 105 U/L (ref 38–126)
BILIRUBIN DIRECT: 0.3 mg/dL (ref 0.1–0.5)
BILIRUBIN INDIRECT: 1.1 mg/dL — AB (ref 0.3–0.9)
Total Bilirubin: 1.4 mg/dL — ABNORMAL HIGH (ref 0.3–1.2)
Total Protein: 10.1 g/dL — ABNORMAL HIGH (ref 6.5–8.1)

## 2015-07-04 LAB — PREPARE RBC (CROSSMATCH)

## 2015-07-04 LAB — LACTATE DEHYDROGENASE: LDH: 257 U/L — ABNORMAL HIGH (ref 98–192)

## 2015-07-04 LAB — TECHNOLOGIST SMEAR REVIEW

## 2015-07-04 MED ORDER — SODIUM CHLORIDE 0.9 % IV SOLN: 100.0000 mL | INTRAVENOUS | Status: DC | PRN

## 2015-07-04 MED ORDER — CALCIUM ACETATE (PHOS BINDER) 667 MG PO CAPS
1334.0000 mg | ORAL_CAPSULE | Freq: Three times a day (TID) | ORAL | Status: DC
  Administered 2015-07-05 – 2015-07-14 (×23): 1334 mg via ORAL
  Filled 2015-07-04 (×24): qty 2

## 2015-07-04 MED ORDER — SODIUM CHLORIDE 0.9 % IV SOLN: Freq: Once | INTRAVENOUS | Status: DC

## 2015-07-04 MED ORDER — SODIUM POLYSTYRENE SULFONATE 15 GM/60ML PO SUSP
30.0000 g | Freq: Once | ORAL | Status: AC
  Administered 2015-07-04: 30 g via ORAL
  Filled 2015-07-04: qty 120

## 2015-07-04 MED ORDER — LIDOCAINE-PRILOCAINE 2.5-2.5 % EX CREA
1.0000 | TOPICAL_CREAM | CUTANEOUS | Status: DC | PRN
Start: 2015-07-04 — End: 2015-07-04

## 2015-07-04 MED ORDER — SODIUM CHLORIDE 0.9 % IV SOLN
1.0000 g | Freq: Once | INTRAVENOUS | Status: AC
  Administered 2015-07-04: 1 g via INTRAVENOUS
  Filled 2015-07-04: qty 10

## 2015-07-04 MED ORDER — DM-GUAIFENESIN ER 30-600 MG PO TB12
1.0000 | ORAL_TABLET | Freq: Two times a day (BID) | ORAL | Status: DC
  Administered 2015-07-04 (×2): 1 via ORAL
  Filled 2015-07-04 (×2): qty 1

## 2015-07-04 MED ORDER — HEPARIN SODIUM (PORCINE) 1000 UNIT/ML DIALYSIS: 1000.0000 [IU] | INTRAMUSCULAR | Status: DC | PRN

## 2015-07-04 MED ORDER — LIDOCAINE HCL (PF) 1 % IJ SOLN: 5.0000 mL | INTRAMUSCULAR | Status: DC | PRN

## 2015-07-04 MED ORDER — PENTAFLUOROPROP-TETRAFLUOROETH EX AERO: 1.0000 "application " | INHALATION_SPRAY | CUTANEOUS | Status: DC | PRN

## 2015-07-04 MED ORDER — ALTEPLASE 2 MG IJ SOLR
2.0000 mg | Freq: Once | INTRAMUSCULAR | Status: DC | PRN
  Filled 2015-07-04: qty 2

## 2015-07-04 MED ORDER — HEPARIN SODIUM (PORCINE) 1000 UNIT/ML DIALYSIS: 4000.0000 [IU] | Freq: Once | INTRAMUSCULAR | Status: DC

## 2015-07-04 MED ORDER — GUAIFENESIN 200 MG PO TABS
200.0000 mg | ORAL_TABLET | Freq: Four times a day (QID) | ORAL | Status: DC | PRN
  Filled 2015-07-04: qty 1

## 2015-07-04 MED ORDER — INSULIN ASPART 100 UNIT/ML ~~LOC~~ SOLN
10.0000 [IU] | Freq: Once | SUBCUTANEOUS | Status: AC
  Administered 2015-07-04: 10 [IU] via SUBCUTANEOUS

## 2015-07-04 NOTE — Progress Notes (Signed)
CRITICAL VALUE ALERT  Critical value received:  Hgb 7.5  Date of notification:  07/04/15  Time of notification:  7:40  Critical value read back: yes  Nurse who received alert:  Cassell Smiles  MD notified (1st page):  Internal medicine  Time of first page:  07:46  MD notified (2nd page):  Time of second page:  Responding MD:  Ahmed  Time MD responded: 09:11

## 2015-07-04 NOTE — Consult Note (Signed)
Renal Service Consult Note Seth [name]  Seth [name] 07/04/2015 [name] D Requesting Physician:  Dr. Baxter [name], C.   Reason for Consult:  ESRD pt with anemia, fatigue HPI: The patient is a 49 y.o. year-old with hx of DM, HTN and ESRD on HD in [place] at [name] on MWF schedule. Goes to all his HD treatments.  He presented with fatigue, dizzy and SOB, slight cough and some URI symptoms. He had a flu shot 2-3 wks ago and he says his symptoms starting some time later after that. He has been on Zmax w/o any improvement.  In ED Hb was 7.4 last night and today is 6.4.  He had HD yesterday but has poor IV access. We are asked to see pt for transfusion of prbc's with HD.    He denies any SOB, bloody stool, melena, hematemesis recently .  No abd pain, no fevers, chills or sweats.  No rash.    Past Medical History  Past Medical History  Diagnosis Date  . Diabetes mellitus   . Hypertension   . Chronic kidney disease   . RETINAL DETACHMENT, HX OF 06/20/2007    Qualifier: Diagnosis of  By: Seth Bergamo RN, [name]    . Sleep apnea    Past Surgical History  Past Surgical History  Procedure Laterality Date  . Av fistula placement  05/03/2012    Procedure: ARTERIOVENOUS (AV) FISTULA CREATION;  Surgeon: Seth [name], MD;  Location: [facility];  Service: Vascular;  Laterality: Right;  . Tracheostomy tube placement N/A 08/20/2013    Procedure: TRACHEOSTOMY Revision;  Surgeon: Seth Quitter, MD;  Location: [name];  Service: ENT;  Laterality: N/A;   Family History  Family History  Problem Relation Age of Onset  . Diabetes Mother   . Diabetes Sister   . Diabetes Brother    Social History  reports that he has never smoked. He has never used smokeless tobacco. He reports that he drinks about 0.6 oz of alcohol per week. He reports that he uses illicit drugs (Marijuana). Allergies No Known Allergies Home medications Prior to Admission medications   Medication Sig Start Date End Date Taking?  Authorizing Provider  amLODipine (NORVASC) 10 MG tablet Take 1 tablet (10 mg total) by mouth daily. 09/19/13  Yes Seth Brooks, NP  aspirin EC 81 MG tablet Take 81 mg by mouth daily.   Yes Historical Provider, MD  calcium acetate (PHOSLO) 667 MG capsule Take 2 capsules (1,334 mg total) by mouth 3 (three) times daily with meals. 09/19/13  Yes Seth Brooks, NP  diazepam (VALIUM) 2 MG tablet Take 1 tablet (2 mg total) by mouth every 8 (eight) hours as needed for muscle spasms. 06/17/15  Yes Seth Chance, MD  insulin aspart (NOVOLOG) 100 UNIT/ML injection Inject 0-15 Units into the skin 3 (three) times daily with meals. CBG < 70: drink orange juice and recheck glucose or use glucose tabs CBG > 400: call MD   CBG 70 - 120: 0 units CBG 121 - 150: 3 units CBG 151 - 200: 4 units CBG 201 - 250: 7 units CBG 251 - 300: 11 units CBG 301 - 350: 15 units CBG 351 - 400: 20 units 09/19/13  Yes Seth Brooks, NP  Vitamin D, Ergocalciferol, (DRISDOL) 50000 UNITS CAPS capsule Take 1 capsule (50,000 Units total) by mouth every 7 (seven) days. *takes on Friday* 09/19/13  Yes Seth Brooks, NP  albuterol (PROVENTIL) (2.5 MG/3ML) 0.083% nebulizer solution Take 3 mLs (  2.5 mg total) by nebulization every 6 (six) hours as needed for wheezing or shortness of breath. 07/03/15   Seth Grana, Seth [name]  predniSONE (DELTASONE) 20 MG tablet Take 2 tablets (40 mg total) by mouth daily. 07/03/15   Seth Grana, Seth [name]   Liver Function Tests No results for input(s): AST, ALT, ALKPHOS, BILITOT, PROT, ALBUMIN in the last 168 hours. No results for input(s): LIPASE, AMYLASE in the last 168 hours. CBC  Recent Labs Lab 07/03/15 1455 07/04/15 0512 07/04/15 1010  WBC 18.4* 28.4* 30.1*  NEUTROABS 14.4* 24.4*  --   HGB 7.5* 6.8* 6.4*  HCT 21.2* 18.0* 17.7*  MCV 98.6 100.0 94.1  PLT 501* PLATELET CLUMPS NOTED ON SMEAR, COUNT APPEARS INCREASED 123456*   Basic Metabolic Panel  Recent Labs Lab 07/03/15 1206 07/04/15 0512  07/04/15 1010  NA 130* 125* 124*  K 4.2 6.6* 4.7  CL 88* 84* 82*  CO2 27 22 23   GLUCOSE 258* 402* 370*  BUN 37* 80* 83*  CREATININE 5.48* 8.22* 8.67*  CALCIUM 9.5 9.6 9.3    Filed Vitals:   07/03/15 1830 07/03/15 1938 07/04/15 0628 07/04/15 0807  BP: 151/92 153/72 157/62 142/81  Pulse: 103 102 90 97  Temp:  99.2 F (37.3 C) 98.2 F (36.8 C) 97.9 F (36.6 C)  TempSrc:  Oral Oral Oral  Resp:   18 18  Weight:      SpO2: 92% 94%  95%   Exam Alert, pleasant obese AAM no distress, up in chair No rash, cyanosis or gangrene Sclera anicteric, throat clear No jvd Chest clear bilat RRR no MRG Abd obese, soft ntnd no mass or ascites GU normal male Ext no LE edema, wounds or ulcers Neuro is alert , nf, Ox 3 R forearm AVF +patent  MWF [facility] [place]  4h  2/2.5 [place]  14 ga buttonhole   141kg   Heparin 4500 then 1000/hr EPO 1000/ hd Hect 2.5 ug tiw   Assessment: 1 Anemia - uncertain cause, hemolysis/ rule out GIB. 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

## 2015-07-04 NOTE — H&P (Signed)
Date: 07/04/2015  Patient name: Seth [name]  Medical record number: [id number]  Date of birth: 10-Mar-1966    Active Problems:   DM II (diabetes mellitus, type II), controlled   Essential hypertension   End stage renal disease on dialysis   Normocytic anemia   OSA (obstructive sleep apnea)   Shortness of breath    HPI: Seth [name] is a 49 y.o. male  with past medical history of DM type II, HTN, ESRD with MWF HD, and OSA who recently had his flu shot roughly 2-3 wk ago, feeling poorly thereafter. He has had several ED visits based on his symptoms, but not until recently given azithromycin for suspected atypical pulmoanry infection. Admitted on day 4 of 5 azithromycin. Thus, he has had  2.5 weeks of malaise, with  Cough, runny nose. Denies any fever. Also states that  Denies any blood in his sputum. he came into the hospital for feeling  lightheaded, and short of breath after his HD session where they also took extra 2 kilos from his dry weight.he thinks antibiotics has helped. Denies any chest pain, fever, chills, n/v/d, urinary complaints. in the ed, he was given steroids, albuterol for wheezing. His baseline labs showed wbc of 18k, 500 plt, but also hgb 7.5 where his baseline is thought to be 10-11 as noted on CBC from 06/24/15. He denies any blood in his stool. Labs also revealed hyperkalemia of 6.6 despite having HD. He was given kayexelate, which he has had bowel movements. He states that he is feeling better since admit.   Past Medical History  Diagnosis Date  . Diabetes mellitus   . Hypertension   . Chronic kidney disease   . 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 Sleep apnea     Allergies: No Known Allergies   MEDICATIONS: . sodium chloride   Intravenous Once  . sodium chloride   Intravenous Once  . aspirin EC  81 mg Oral Daily  . [START ON 07/05/2015] calcium acetate  1,334 mg Oral TID WC  .  dextromethorphan-guaiFENesin  1 tablet Oral BID  . enoxaparin (LOVENOX) injection  30 mg Subcutaneous Q24H  . insulin aspart  0-9 Units Subcutaneous TID WC  . pneumococcal 23 valent vaccine  0.5 mL Intramuscular Tomorrow-1000    Social History  Substance Use Topics  . Smoking status: Never Smoker   . Smokeless tobacco: Never Used  . Alcohol Use: 0.6 oz/week    1 Cans of beer per week    Family History  Problem Relation Age of Onset  . Diabetes Mother   . Diabetes Sister   . Diabetes Brother      Review of Systems  Constitutional: positive for malaise. Negative for fever, chills, diaphoresis, activity change, appetite change, fatigue and unexpected weight change.  HENT: Negative for congestion, sore throat, rhinorrhea, sneezing, trouble swallowing and sinus pressure.  Eyes: Negative for photophobia and visual disturbance.  Respiratory: positive for cough, shortness of breath but negative for chest tightness, wheezing and stridor.  Cardiovascular: Negative for chest pain, palpitations and leg swelling.  Gastrointestinal: Negative for nausea, vomiting, abdominal pain, diarrhea, constipation, blood in stool, abdominal distention and anal bleeding.  Genitourinary: Negative for dysuria, hematuria, flank pain and difficulty urinating.  Musculoskeletal: Negative for myalgias, back pain, joint swelling, arthralgias and gait problem.  Skin: Negative for color change, pallor, rash and wound.  Neurological: Negative for dizziness, tremors, weakness and  light-headedness.  Hematological: Negative for adenopathy. Does not bruise/bleed easily.  Psychiatric/Behavioral: Negative for behavioral problems, confusion, sleep disturbance, dysphoric mood, decreased concentration and agitation.    OBJECTIVE: Temp:  [97.9 F (36.6 C)-99.2 F (37.3 C)] 97.9 F (36.6 C) (09/24 0807) Pulse Rate:  [90-105] 97 (09/24 0807) Resp:  [13-25] 18 (09/24 0807) BP: (102-157)/(62-92) 142/81 mmHg (09/24  0807) SpO2:  [92 %-100 %] 95 % (09/24 0807) Gen = a xo by 3 in NAD Head: Normocephalic and atraumatic. Eyes: EOMI, conjunctivae normal, No scleral icterus.  Neck: Supple Cardiovascular: mildly tachycardic, regular rhythm, S1 normal, S2 normal, no murmurs, gallops, or rubs. Pulmonary/Chest: Air entry equal bilaterally, scattered wheezes and rhonchi Abdominal: Soft, non-tender, non-distended, BS +, no masses, organomegaly, or guarding present.  Extremities: No swelling or edema, pulses symmetric and intact bilaterally. No cyanosis or clubbing. Neurological: A&O x3, Strength is normal and symmetric bilaterally, cranial nerve II-XII are grossly intact, no focal motor deficit, sensory intact to light touch bilaterally.  Skin: Warm, dry and intact. No rashes or erythema. Psychiatric: Normal mood and affect. speech and behavior is normal. Cognition and memory are normal.  LABS: Results for orders placed or performed during the hospital encounter of 07/03/15 (from the past 48 hour(s))  Basic metabolic panel     Status: Abnormal   Collection Time: 07/03/15 12:06 PM  Result Value Ref Range   Sodium 130 (L) 135 - 145 mmol/L   Potassium 4.2 3.5 - 5.1 mmol/L   Chloride 88 (L) 101 - 111 mmol/L   CO2 27 22 - 32 mmol/L   Glucose, Bld 258 (H) 65 - 99 mg/dL   BUN 37 (H) 6 - 20 mg/dL   Creatinine, Ser 5.48 (H) 0.61 - 1.24 mg/dL   Calcium 9.5 8.9 - 10.3 mg/dL   GFR calc non Af Amer 11 (L) >60 mL/min   GFR calc Af Amer 13 (L) >60 mL/min    Comment: (NOTE) The eGFR has been calculated using the CKD EPI equation. This calculation has not been validated in all clinical situations. eGFR's persistently <60 mL/min signify possible Chronic Kidney Disease.    Anion gap 15 5 - 15  I-stat troponin, ED     Status: None   Collection Time: 07/03/15 12:16 PM  Result Value Ref Range   Troponin i, poc 0.00 0.00 - 0.08 ng/mL   Comment 3            Comment: Due to the release kinetics of cTnI, a negative result  within the first hours of the onset of symptoms does not rule out myocardial infarction with certainty. If myocardial infarction is still suspected, repeat the test at appropriate intervals.   CBC with Differential     Status: Abnormal   Collection Time: 07/03/15  2:55 PM  Result Value Ref Range   WBC 18.4 (H) 4.0 - 10.5 K/uL   RBC 2.15 (L) 4.22 - 5.81 MIL/uL   Hemoglobin 7.5 (L) 13.0 - 17.0 g/dL   HCT 21.2 (L) 39.0 - 52.0 %   MCV 98.6 78.0 - 100.0 fL   MCH 34.9 (H) 26.0 - 34.0 pg   MCHC 35.4 30.0 - 36.0 g/dL    Comment: CORRECTED FOR COLD AGGLUTININS   RDW 18.6 (H) 11.5 - 15.5 %   Platelets 501 (H) 150 - 400 K/uL   Neutrophils Relative % 78 %   Lymphocytes Relative 12 %   Monocytes Relative 4 %   Eosinophils Relative 5 %   Basophils Relative 1 %  Neutro Abs 14.4 (H) 1.7 - 7.7 K/uL   Lymphs Abs 2.2 0.7 - 4.0 K/uL   Monocytes Absolute 0.7 0.1 - 1.0 K/uL   Eosinophils Absolute 0.9 (H) 0.0 - 0.7 K/uL   Basophils Absolute 0.2 (H) 0.0 - 0.1 K/uL   RBC Morphology POLYCHROMASIA PRESENT     Comment: TARGET CELLS   WBC Morphology MILD LEFT SHIFT (1-5% METAS, OCC MYELO, OCC BANDS)   Type and screen     Status: None (Preliminary result)   Collection Time: 07/03/15  4:45 PM  Result Value Ref Range   ABO/RH(D) B POS    Antibody Screen POS    Sample Expiration 07/06/2015    Antibody Identification ANTI I    DAT, IgG NEG    Unit Number [id number]    Blood Component Type RED CELLS,LR    Unit division 00    Status of Unit ALLOCATED    Transfusion Status OK TO TRANSFUSE    Crossmatch Result COMPATIBLE    Unit Number [id number]    Blood Component Type RED CELLS,LR    Unit division 00    Status of Unit ALLOCATED    Transfusion Status OK TO TRANSFUSE    Crossmatch Result COMPATIBLE   MRSA PCR Screening     Status: None   Collection Time: 07/03/15  7:59 PM  Result Value Ref Range   MRSA by PCR NEGATIVE NEGATIVE    Comment:        The GeneXpert MRSA Assay (FDA approved for  NASAL specimens only), is one component of a comprehensive MRSA colonization surveillance program. It is not intended to diagnose MRSA infection nor to guide or monitor treatment for MRSA infections.   Glucose, capillary     Status: Abnormal   Collection Time: 07/03/15  8:13 PM  Result Value Ref Range   Glucose-Capillary 407 (H) 65 - 99 mg/dL  Glucose, capillary     Status: Abnormal   Collection Time: 07/03/15 10:10 PM  Result Value Ref Range   Glucose-Capillary 522 (H) 65 - 99 mg/dL  Glucose, capillary     Status: Abnormal   Collection Time: 07/04/15 12:19 AM  Result Value Ref Range   Glucose-Capillary 453 (H) 65 - 99 mg/dL  Basic metabolic panel     Status: Abnormal   Collection Time: 07/04/15  5:12 AM  Result Value Ref Range   Sodium 125 (L) 135 - 145 mmol/L   Potassium 6.6 (HH) 3.5 - 5.1 mmol/L    Comment: CRITICAL RESULT CALLED TO, READ BACK BY AND VERIFIED WITH: YOCUM,R RN 07/04/2015 0551 JORDANS    Chloride 84 (L) 101 - 111 mmol/L   CO2 22 22 - 32 mmol/L   Glucose, Bld 402 (H) 65 - 99 mg/dL   BUN 80 (H) 6 - 20 mg/dL   Creatinine, Ser 8.22 (H) 0.61 - 1.24 mg/dL   Calcium 9.6 8.9 - 10.3 mg/dL   GFR calc non Af Amer 7 (L) >60 mL/min   GFR calc Af Amer 8 (L) >60 mL/min    Comment: (NOTE) The eGFR has been calculated using the CKD EPI equation. This calculation has not been validated in all clinical situations. eGFR's persistently <60 mL/min signify possible Chronic Kidney Disease.    Anion gap 19 (H) 5 - 15  CBC WITH DIFFERENTIAL     Status: Abnormal   Collection Time: 07/04/15  5:12 AM  Result Value Ref Range   WBC 28.4 (H) 4.0 - 10.5 K/uL    Comment: REPEATED TO  VERIFY WHITE COUNT CONFIRMED ON SMEAR    RBC 1.80 (L) 4.22 - 5.81 MIL/uL   Hemoglobin 6.8 (LL) 13.0 - 17.0 g/dL    Comment: REPEATED TO VERIFY CONSISTENT WITH PREVIOUS RESULT CRITICAL RESULT CALLED TO, READ BACK BY AND VERIFIED WITH: REBECCA GREENWALT,RN AT 9935 07/04/15. K.PAXTON    HCT 18.0 (L)  39.0 - 52.0 %   MCV 100.0 78.0 - 100.0 fL   MCH 37.8 (H) 26.0 - 34.0 pg   MCHC 37.8 (H) 30.0 - 36.0 g/dL    Comment: RULED OUT INTERFERING SUBSTANCES   Platelets  150 - 400 K/uL    PLATELET CLUMPS NOTED ON SMEAR, COUNT APPEARS INCREASED   Neutrophils Relative % 86 %   Lymphocytes Relative 9 %   Monocytes Relative 5 %   Eosinophils Relative 0 %   Basophils Relative 0 %   Neutro Abs 24.4 (H) 1.7 - 7.7 K/uL   Lymphs Abs 2.6 0.7 - 4.0 K/uL   Monocytes Absolute 1.4 (H) 0.1 - 1.0 K/uL   Eosinophils Absolute 0.0 0.0 - 0.7 K/uL   Basophils Absolute 0.0 0.0 - 0.1 K/uL   RBC Morphology POLYCHROMASIA PRESENT   Glucose, capillary     Status: Abnormal   Collection Time: 07/04/15  6:23 AM  Result Value Ref Range   Glucose-Capillary 364 (H) 65 - 99 mg/dL  Glucose, capillary     Status: Abnormal   Collection Time: 07/04/15  7:59 AM  Result Value Ref Range   Glucose-Capillary 371 (H) 65 - 99 mg/dL  Lactate dehydrogenase     Status: Abnormal   Collection Time: 07/04/15 10:10 AM  Result Value Ref Range   LDH 257 (H) 98 - 192 U/L  Technologist smear review     Status: None   Collection Time: 07/04/15 10:10 AM  Result Value Ref Range   Tech Review MORPHOLOGY UNREMARKABLE   Reticulocytes     Status: Abnormal   Collection Time: 07/04/15 10:10 AM  Result Value Ref Range   Retic Ct Pct 4.1 (H) 0.4 - 3.1 %   RBC. 1.88 (L) 4.22 - 5.81 MIL/uL   Retic Count, Manual 77.1 19.0 - 186.0 K/uL  CBC     Status: Abnormal   Collection Time: 07/04/15 10:10 AM  Result Value Ref Range   WBC 30.1 (H) 4.0 - 10.5 K/uL   RBC 1.88 (L) 4.22 - 5.81 MIL/uL   Hemoglobin 6.4 (LL) 13.0 - 17.0 g/dL    Comment: REPEATED TO VERIFY CRITICAL VALUE NOTED.  VALUE IS CONSISTENT WITH PREVIOUSLY REPORTED AND CALLED VALUE.    HCT 17.7 (L) 39.0 - 52.0 %   MCV 94.1 78.0 - 100.0 fL   MCH 34.0 26.0 - 34.0 pg   MCHC 36.2 (H) 30.0 - 36.0 g/dL    Comment: CORRECTED FOR COLD AGGLUTININS   RDW 16.9 (H) 11.5 - 15.5 %   Platelets 530  (H) 150 - 400 K/uL  Basic metabolic panel     Status: Abnormal   Collection Time: 07/04/15 10:10 AM  Result Value Ref Range   Sodium 124 (L) 135 - 145 mmol/L   Potassium 4.7 3.5 - 5.1 mmol/L   Chloride 82 (L) 101 - 111 mmol/L   CO2 23 22 - 32 mmol/L   Glucose, Bld 370 (H) 65 - 99 mg/dL   BUN 83 (H) 6 - 20 mg/dL   Creatinine, Ser 8.67 (H) 0.61 - 1.24 mg/dL   Calcium 9.3 8.9 - 10.3 mg/dL   GFR calc non Af  Amer 6 (L) >60 mL/min   GFR calc Af Amer 7 (L) >60 mL/min    Comment: (NOTE) The eGFR has been calculated using the CKD EPI equation. This calculation has not been validated in all clinical situations. eGFR's persistently <60 mL/min signify possible Chronic Kidney Disease.    Anion gap 19 (H) 5 - 15  Glucose, capillary     Status: Abnormal   Collection Time: 07/04/15 11:40 AM  Result Value Ref Range   Glucose-Capillary 317 (H) 65 - 99 mg/dL  Prepare RBC     Status: None   Collection Time: 07/04/15  1:30 PM  Result Value Ref Range   Order Confirmation ORDER PROCESSED BY BLOOD BANK     IMAGING: Dg Chest 2 View  07/03/2015   CLINICAL DATA:  Dry cough for approximately 10 days. Hypotension after dialysis. Tremors today.  EXAM: CHEST  2 VIEW  COMPARISON:  PA and lateral chest 06/24/2015.  FINDINGS: Right basilar atelectasis seen on the comparison examination has improved. The left lung is clear. Heart size is upper normal. No pneumothorax or pleural effusion.  IMPRESSION: No acute disease.  Improved right basilar atelectasis.   Electronically Signed   By: Inge Rise M.D.   On: 07/03/2015 12:40   i personally reviewed cxr and agree that there is still some atelectesis on right base, no other infiltrate  Assessment and Plan: 49yo M with ESRD on HD, DM2 presents with shortness of breath that is worsened from the week prior, after having hemodialysis. He is finishing course of azithromycin for bronchitis. After receiving steroids, has marked leukocytosis, but no fevers, and improved  clinically  I have seen and evaluated the patient as outlined above. I agree with the formulated Assessment and Plan as detailed in the residents' admission note, with the following changes:   1. Anemia = appears having decreased in < 1 week. Will do anemia work up to see if he is hemolyzing. Or other signs of blood loss. Will consult nephrology to see if can also get blood transfusion via hemodialysis  2. Leukocytosis = likely response to steroids, though marked. Will watch for now to see if it trends down. No other signs of infection presently. Will hold on antibiotics, unless he has SIRS  3. Hyperglycemia in a DM2 patient = likely from steroids, will place on SSI for now  4. URI/bronchitis = he has finished course of azithromycin, though i suspect this might be viral. Will continue to monitor  5. Shortness of breath = now resolved  6. Hyperkalemia = will repeat BMP to ensure potassium has trended down. May need repeat kayexelate if not tredning down  Carlyle Basques, MD 9/24/20161:24 PM

## 2015-07-04 NOTE — Progress Notes (Signed)
Pt places self on/off cpap/ 

## 2015-07-04 NOTE — Procedures (Signed)
  I was present at this dialysis session, have reviewed the session itself and made  appropriate changes Kelly Splinter MD (pgr) [phone]    [phone] 07/04/2015, 3:34 PM

## 2015-07-04 NOTE — Progress Notes (Signed)
Subjective: Patient doing well this morning, only complaint is mild, lingering cough.  Reports having bowel movements after dose of Kayexalate.  Overnight, glucose was elevated to 522 and was asymptomatic.  Given 9 units Novolog and glucose decreased to 453.  This morning, potassium was found to be 6.6.  Given Kayexalate 30g x 1, 10 units of Novolog, and EKG was ordered.  EKG with no peaked T-waves.  Repeat BMP later this morning shows potassium of 4.7.  Other than mild cough, patient denies any chest pain, shortness of breath, urinary complaints.  States he did not have any melena or BRBPR.  No other noticeable bleeding.   Objective: Vital signs in last 24 hours: Filed Vitals:   07/03/15 1830 07/03/15 1938 07/04/15 0628 07/04/15 0807  BP: 151/92 153/72 157/62 142/81  Pulse: 103 102 90 97  Temp:  99.2 F (37.3 C) 98.2 F (36.8 C) 97.9 F (36.6 C)  TempSrc:  Oral Oral Oral  Resp:   18 18  Weight:      SpO2: 92% 94%  95%   Weight change:   Intake/Output Summary (Last 24 hours) at 07/04/15 1231 Last data filed at 07/04/15 1144  Gross per 24 hour  Intake    950 ml  Output      0 ml  Net    950 ml   General: sitting up in chair, no distress HEENT: EOMI, no scleral icterus Cardiac: RRR, no rubs, murmurs or gallops Pulm: clear to auscultation bilaterally, breath sounds improved since last night, moving normal volumes of air Abd: soft, nontender, nondistended, BS present Ext: warm and well perfused, no pedal edema Neuro: alert and oriented X3, cranial nerves II-XII grossly intact  Lab Results: Basic Metabolic Panel:7  Recent Labs Lab 07/04/15 0512 07/04/15 1010  NA 125* 124*  K 6.6* 4.7  CL 84* 82*  CO2 22 23  GLUCOSE 402* 370*  BUN 80* 83*  CREATININE 8.22* 8.67*  CALCIUM 9.6 9.3   Liver Function Tests: No results for input(s): AST, ALT, ALKPHOS, BILITOT, PROT, ALBUMIN in the last 168 hours. No results for input(s): LIPASE, AMYLASE in the last 168 hours. No results  for input(s): AMMONIA in the last 168 hours. CBC:  Recent Labs Lab 07/03/15 1455 07/04/15 0512 07/04/15 1010  WBC 18.4* 28.4* 30.1*  NEUTROABS 14.4* 24.4*  --   HGB 7.5* 6.8* 6.4*  HCT 21.2* 18.0* 17.7*  MCV 98.6 100.0 94.1  PLT 501* PLATELET CLUMPS NOTED ON SMEAR, COUNT APPEARS INCREASED 530*   Cardiac Enzymes: No results for input(s): CKTOTAL, CKMB, CKMBINDEX, TROPONINI in the last 168 hours. BNP: No results for input(s): PROBNP in the last 168 hours. D-Dimer: No results for input(s): DDIMER in the last 168 hours. CBG:  Recent Labs Lab 07/03/15 2013 07/03/15 2210 07/04/15 0019 07/04/15 0623 07/04/15 0759 07/04/15 1140  GLUCAP 407* 522* 453* 364* 371* 317*   Hemoglobin A1C: No results for input(s): HGBA1C in the last 168 hours. Fasting Lipid Panel: No results for input(s): CHOL, HDL, LDLCALC, TRIG, CHOLHDL, LDLDIRECT in the last 168 hours. Thyroid Function Tests: No results for input(s): TSH, T4TOTAL, FREET4, T3FREE, THYROIDAB in the last 168 hours. Coagulation: No results for input(s): LABPROT, INR in the last 168 hours. Anemia Panel:  Recent Labs Lab 07/04/15 1010  RETICCTPCT 4.1*   Urine Drug Screen: Drugs of Abuse  No results found for: LABOPIA, COCAINSCRNUR, LABBENZ, AMPHETMU, THCU, LABBARB  Alcohol Level: No results for input(s): ETH in the last 168 hours. Urinalysis: No  results for input(s): COLORURINE, LABSPEC, PHURINE, GLUCOSEU, HGBUR, BILIRUBINUR, KETONESUR, PROTEINUR, UROBILINOGEN, NITRITE, LEUKOCYTESUR in the last 168 hours.  Invalid input(s): APPERANCEUR  Micro Results: Recent Results (from the past 240 hour(s))  MRSA PCR Screening     Status: None   Collection Time: 07/03/15  7:59 PM  Result Value Ref Range Status   MRSA by PCR NEGATIVE NEGATIVE Final    Comment:        The GeneXpert MRSA Assay (FDA approved for NASAL specimens only), is one component of a comprehensive MRSA colonization surveillance program. It is not intended to  diagnose MRSA infection nor to guide or monitor treatment for MRSA infections.    Studies/Results: Dg Chest 2 View  07/03/2015   CLINICAL DATA:  Dry cough for approximately 10 days. Hypotension after dialysis. Tremors today.  EXAM: CHEST  2 VIEW  COMPARISON:  PA and lateral chest 06/24/2015.  FINDINGS: Right basilar atelectasis seen on the comparison examination has improved. The left lung is clear. Heart size is upper normal. No pneumothorax or pleural effusion.  IMPRESSION: No acute disease.  Improved right basilar atelectasis.   Electronically Signed   By: Inge Rise M.D.   On: 07/03/2015 12:40   Medications:  Scheduled Meds: . sodium chloride   Intravenous Once  . aspirin EC  81 mg Oral Daily  . [START ON 07/05/2015] calcium acetate  1,334 mg Oral TID WC  . dextromethorphan-guaiFENesin  1 tablet Oral BID  . enoxaparin (LOVENOX) injection  30 mg Subcutaneous Q24H  . insulin aspart  0-9 Units Subcutaneous TID WC  . pneumococcal 23 valent vaccine  0.5 mL Intramuscular Tomorrow-1000   Continuous Infusions:  PRN Meds:.ipratropium-albuterol Assessment/Plan: Active Problems:   DM II (diabetes mellitus, type II), controlled   Essential hypertension   End stage renal disease on dialysis   Normocytic anemia   OSA (obstructive sleep apnea)   Shortness of breath  49 y.o. AA male with past medical history of DM type II, HTN, ESRD with MWF HD, and OSA who presents to the ED with 2.5 weeks of feeling bad with what he describes as cold like symptoms, runny nose.  Leukocytosis -WBC at admission of 18.4 with 78% PMNs.  Received prednisone 60mg  in the ED.  WBC this morning 28.4 with 86% PMNs.  Follow up CBC with WBC 30.1.  Had an CBC done on 9/14 with WBC 10.2 -afebrile, no tachycardia, no tachypnea, normotensive, no obvious signs of infection -will check blood cultures x 2  Anemia -Hgb 7.5 on admission, down from 11.3 on 9/14.  Subsequent CBCs with Hgb 6.8 >> 6.4.  HCT 21.2  >>18>>17.7 -MCV 94.1 -LDH 257, Reticulocyte % 4.1 -Smear, haptoglobin pending -check hepatic panel -FOBT obtained, results pending -will tranfuse 2 units today  -check iron, TIBC pre-transfusion  Cough -chest x-ray on admission compared to 9/14 shows improved right bibasilar atelectasis -strep pneumo, legionella pending -Mucinex DM 1 tablet bid -continue Duonebs q4h prn  Diabetes mellitus -glucose 258 on admission -sliding scale insulin -check a1c  Hypertension -Holding amlodipine  Hypnoatremia -124 today with glucose of 370, corrected to 130 -likely due to hyperglycemia combined with possible increased volume from renal disease -continue to monitor  Hyperkalemia -potassium was found to be 6.6 this morning.  Given Kayexalate 30g x 1, 10 units of Novolog, and EKG was ordered.  EKG with no peaked T-waves.  Repeat BMP later this morning shows potassium of 4.7. -continue to monitor  OSA -CPAP  Dispo: Disposition is deferred at this  time, awaiting improvement of current medical problems.    The patient does have a current PCP (Tresa Garter, MD) and does not need an [facility] hospital follow-up appointment after discharge.  The patient does not have transportation limitations that hinder transportation to clinic appointments.    LOS: 1 day   Jule Ser, DO 07/04/2015, 12:31 PM

## 2015-07-04 NOTE — Progress Notes (Signed)
Patient did not receive IV Ca++ gluconate d/t loss of IV access. Patient refusing to have PIV placed.  Dr. Genene Churn aware.

## 2015-07-05 LAB — CBC
HCT: 20.3 % — ABNORMAL LOW (ref 39.0–52.0)
Hemoglobin: 7 g/dL — ABNORMAL LOW (ref 13.0–17.0)
MCH: 29.9 pg (ref 26.0–34.0)
MCHC: 34.5 g/dL (ref 30.0–36.0)
MCV: 86.8 fL (ref 78.0–100.0)
PLATELETS: 520 10*3/uL — AB (ref 150–400)
RBC: 2.34 MIL/uL — ABNORMAL LOW (ref 4.22–5.81)
RDW: 14.8 % (ref 11.5–15.5)
WBC: 20.6 10*3/uL — AB (ref 4.0–10.5)

## 2015-07-05 LAB — COMPREHENSIVE METABOLIC PANEL
ALBUMIN: 3.2 g/dL — AB (ref 3.5–5.0)
ALT: 22 U/L (ref 17–63)
AST: 26 U/L (ref 15–41)
Alkaline Phosphatase: 97 U/L (ref 38–126)
Anion gap: 16 — ABNORMAL HIGH (ref 5–15)
BUN: 77 mg/dL — AB (ref 6–20)
CHLORIDE: 87 mmol/L — AB (ref 101–111)
CO2: 26 mmol/L (ref 22–32)
CREATININE: 8.28 mg/dL — AB (ref 0.61–1.24)
Calcium: 9.3 mg/dL (ref 8.9–10.3)
GFR calc non Af Amer: 7 mL/min — ABNORMAL LOW (ref >60)
GFR, EST AFRICAN AMERICAN: 8 mL/min — AB (ref >60)
Glucose, Bld: 307 mg/dL — ABNORMAL HIGH (ref 65–99)
Potassium: 4.4 mmol/L (ref 3.5–5.1)
SODIUM: 129 mmol/L — AB (ref 135–145)
Total Bilirubin: 1.3 mg/dL — ABNORMAL HIGH (ref 0.3–1.2)
Total Protein: 9.2 g/dL — ABNORMAL HIGH (ref 6.5–8.1)

## 2015-07-05 LAB — CBC WITH DIFFERENTIAL/PLATELET
Basophils Absolute: 0.2 10*3/uL — ABNORMAL HIGH (ref 0.0–0.1)
Basophils Relative: 1 %
EOS ABS: 0.2 10*3/uL (ref 0.0–0.7)
Eosinophils Relative: 1 %
HCT: 21.4 % — ABNORMAL LOW (ref 39.0–52.0)
HEMOGLOBIN: 7.3 g/dL — AB (ref 13.0–17.0)
LYMPHS ABS: 3.6 10*3/uL (ref 0.7–4.0)
Lymphocytes Relative: 16 %
MCH: 30.2 pg (ref 26.0–34.0)
MCHC: 34.1 g/dL (ref 30.0–36.0)
MCV: 88.4 fL (ref 78.0–100.0)
MONOS PCT: 7 %
Monocytes Absolute: 1.5 10*3/uL — ABNORMAL HIGH (ref 0.1–1.0)
NEUTROS PCT: 75 %
Neutro Abs: 16.7 10*3/uL — ABNORMAL HIGH (ref 1.7–7.7)
Platelets: 490 10*3/uL — ABNORMAL HIGH (ref 150–400)
RBC: 2.42 MIL/uL — ABNORMAL LOW (ref 4.22–5.81)
RDW: 15 % (ref 11.5–15.5)
WBC: 22.1 10*3/uL — ABNORMAL HIGH (ref 4.0–10.5)

## 2015-07-05 LAB — GLUCOSE, CAPILLARY
Glucose-Capillary: 301 mg/dL — ABNORMAL HIGH (ref 65–99)
Glucose-Capillary: 325 mg/dL — ABNORMAL HIGH (ref 65–99)
Glucose-Capillary: 259 mg/dL — ABNORMAL HIGH (ref 65–99)
Glucose-Capillary: 283 mg/dL — ABNORMAL HIGH (ref 65–99)

## 2015-07-05 LAB — STREP PNEUMONIAE URINARY ANTIGEN: STREP PNEUMO URINARY ANTIGEN: NEGATIVE

## 2015-07-05 LAB — HAPTOGLOBIN: HAPTOGLOBIN: 231 mg/dL — AB (ref 34–200)

## 2015-07-05 MED ORDER — IPRATROPIUM-ALBUTEROL 0.5-2.5 (3) MG/3ML IN SOLN
3.0000 mL | Freq: Four times a day (QID) | RESPIRATORY_TRACT | Status: DC
  Administered 2015-07-05 – 2015-07-07 (×9): 3 mL via RESPIRATORY_TRACT
  Filled 2015-07-05 (×9): qty 3

## 2015-07-05 MED ORDER — ACETAMINOPHEN-CODEINE #3 300-30 MG PO TABS
2.0000 | ORAL_TABLET | Freq: Once | ORAL | Status: AC
  Administered 2015-07-05: 2 via ORAL
  Filled 2015-07-05: qty 2

## 2015-07-05 MED ORDER — VANCOMYCIN HCL 10 G IV SOLR
2500.0000 mg | Freq: Once | INTRAVENOUS | Status: AC
  Administered 2015-07-05: 2500 mg via INTRAVENOUS
  Filled 2015-07-05: qty 2500

## 2015-07-05 MED ORDER — CODEINE SULFATE 15 MG PO TABS
15.0000 mg | ORAL_TABLET | Freq: Once | ORAL | Status: AC
  Administered 2015-07-06: 15 mg via ORAL
  Filled 2015-07-05: qty 1

## 2015-07-05 MED ORDER — DM-GUAIFENESIN ER 30-600 MG PO TB12: 1.0000 | ORAL_TABLET | Freq: Two times a day (BID) | ORAL | Status: DC | PRN

## 2015-07-05 MED ORDER — INSULIN ASPART 100 UNIT/ML ~~LOC~~ SOLN
4.0000 [IU] | Freq: Three times a day (TID) | SUBCUTANEOUS | Status: DC
  Administered 2015-07-06 – 2015-07-14 (×20): 4 [IU] via SUBCUTANEOUS

## 2015-07-05 MED ORDER — GUAIFENESIN-CODEINE 100-10 MG/5ML PO SOLN
5.0000 mL | ORAL | Status: DC | PRN
  Administered 2015-07-05 – 2015-07-06 (×4): 5 mL via ORAL
  Filled 2015-07-05 (×5): qty 5

## 2015-07-05 MED ORDER — ACETAMINOPHEN-CODEINE #3 300-30 MG PO TABS: 2.0000 | ORAL_TABLET | Freq: Three times a day (TID) | ORAL | Status: DC | PRN

## 2015-07-05 MED ORDER — AMLODIPINE BESYLATE 10 MG PO TABS
10.0000 mg | ORAL_TABLET | Freq: Every day | ORAL | Status: DC
  Administered 2015-07-05 – 2015-07-11 (×6): 10 mg via ORAL
  Filled 2015-07-05 (×7): qty 1

## 2015-07-05 MED ORDER — INSULIN ASPART 100 UNIT/ML ~~LOC~~ SOLN
0.0000 [IU] | Freq: Every day | SUBCUTANEOUS | Status: DC
  Administered 2015-07-05: 3 [IU] via SUBCUTANEOUS
  Administered 2015-07-06: 2 [IU] via SUBCUTANEOUS
  Administered 2015-07-07: 3 [IU] via SUBCUTANEOUS
  Administered 2015-07-08 – 2015-07-09 (×2): 2 [IU] via SUBCUTANEOUS
  Administered 2015-07-10: 3 [IU] via SUBCUTANEOUS
  Administered 2015-07-11: 2 [IU] via SUBCUTANEOUS

## 2015-07-05 MED ORDER — DOXERCALCIFEROL 4 MCG/2ML IV SOLN
2.5000 ug | INTRAVENOUS | Status: DC
  Administered 2015-07-06 – 2015-07-10 (×3): 2.5 ug via INTRAVENOUS
  Filled 2015-07-05 (×4): qty 2

## 2015-07-05 MED ORDER — RENA-VITE PO TABS
1.0000 | ORAL_TABLET | Freq: Every day | ORAL | Status: DC
  Administered 2015-07-05 – 2015-07-13 (×9): 1 via ORAL
  Filled 2015-07-05 (×10): qty 1

## 2015-07-05 MED ORDER — DARBEPOETIN ALFA 150 MCG/0.3ML IJ SOSY
150.0000 ug | PREFILLED_SYRINGE | INTRAMUSCULAR | Status: DC
  Administered 2015-07-06: 150 ug via INTRAVENOUS
  Filled 2015-07-05 (×2): qty 0.3

## 2015-07-05 MED ORDER — INSULIN ASPART 100 UNIT/ML ~~LOC~~ SOLN
0.0000 [IU] | Freq: Three times a day (TID) | SUBCUTANEOUS | Status: DC
  Administered 2015-07-06 (×2): 5 [IU] via SUBCUTANEOUS
  Administered 2015-07-07: 7 [IU] via SUBCUTANEOUS
  Administered 2015-07-07 – 2015-07-08 (×3): 5 [IU] via SUBCUTANEOUS
  Administered 2015-07-08: 7 [IU] via SUBCUTANEOUS
  Administered 2015-07-09 (×3): 5 [IU] via SUBCUTANEOUS
  Administered 2015-07-10: 3 [IU] via SUBCUTANEOUS
  Administered 2015-07-10: 2 [IU] via SUBCUTANEOUS
  Administered 2015-07-11: 3 [IU] via SUBCUTANEOUS
  Administered 2015-07-11 – 2015-07-12 (×3): 2 [IU] via SUBCUTANEOUS
  Administered 2015-07-12: 1 [IU] via SUBCUTANEOUS
  Administered 2015-07-12 – 2015-07-13 (×4): 2 [IU] via SUBCUTANEOUS
  Administered 2015-07-14: 1 [IU] via SUBCUTANEOUS
  Administered 2015-07-14: 2 [IU] via SUBCUTANEOUS

## 2015-07-05 NOTE — Progress Notes (Signed)
CRITICAL VALUE ALERT  Critical value received:  B/c gram positive cocci  Date of notification:  07/05/15  Time of notification:  13:20  Critical value read back: yes  Nurse who received alert:  Nelida Meuse  MD notified (1st page):    Time of first page:  15:53  MD notified (2nd page):  Time of second page:  Responding MD:  Dr. Benjamine Mola  Time MD responded:  15:58

## 2015-07-05 NOTE — Progress Notes (Signed)
Subjective:  Tolerated HD, feels better post transfusion / dry cough  Only co / tolerated CPAP use last pm  Objective Vital signs in last 24 hours: Filed Vitals:   07/04/15 1659 07/04/15 2001 07/05/15 0428 07/05/15 0826  BP: 166/83 132/70 160/75 143/60  Pulse: 97 91 92 91  Temp: 97.7 F (36.5 C) 97.5 F (36.4 C) 98.6 F (37 C) 98.4 F (36.9 C)  TempSrc: Oral Axillary Oral Oral  Resp: 19 20 18 19   Height:      Weight:      SpO2: 97% 100% 99% 93%   Weight change: 4 kg (8 lb 13.1 oz)  Physical Exam: General: alert obese pleasant AA male NAD, [name]  Heart: RRR no mur , rub or gal Lungs: CTA bilat , nonlab breathing  Abdomen: obese , soft NT Extremities: no pedal edema Dialysis Access: R FA AVF pos bruit    OP HD=MWF [facility] 4h 2/2.5 Bath 14 ga buttonhole 141kg Heparin 4500 then 1000/hr EPO 1000/ hd Hect 2.5 ug tiw  Problem/Plan: 1.  Anemia-  Admit team wu/ hem e neg stool/[facility] 10.3 06/22/15/ on epo at  [facility] unit / aranesp  150 in am  Hd  2. ESRD - HD yest  For RBC transfusion / MWF HD schedule ( [facility]) 3. HTN/volume - on Norvasc and OP [facility] has been challenging his edw down per his request  4. Secondary hyperparathyroidism - Phoslo and Hectorol iv on hd 5. Cough / "bronchitis - admit team RX 6. DM  - per admit  7. Obesity with OSA  - use cpap/ trying to lose wt taper edw as bp allows  Ernest Haber, PA-C [place] [phone] 07/05/2015,8:34 AM  LOS: 2 days   Pt seen, examined and agree w A/P as above.  Kelly Splinter MD pager [phone]    cell [phone] 07/05/2015, 5:29 PM    Labs: Basic Metabolic Panel:  Recent Labs Lab 07/04/15 1010 07/04/15 1800 07/05/15 0453  NA 124* 128* 129*  K 4.7 3.5 4.4  CL 82* 87* 87*  CO2 23 23 26   GLUCOSE 370* 299* 307*  BUN 83* 63* 77*  CREATININE 8.67* 6.76* 8.28*  CALCIUM 9.3 9.1 9.3   Liver Function Tests:  Recent Labs Lab 07/04/15 1341  07/05/15 0453  AST 19 26  ALT 19 22  ALKPHOS 105 97  BILITOT 1.4* 1.3*  PROT 10.1* 9.2*  ALBUMIN 3.2* 3.2*    Recent Labs Lab 07/03/15 1455 07/04/15 0512 07/04/15 1010 07/05/15 0453  WBC 18.4* 28.4* 30.1* 22.1*  NEUTROABS 14.4* 24.4*  --  16.7*  HGB 7.5* 6.8* 6.4* 7.3*  HCT 21.2* 18.0* 17.7* 21.4*  MCV 98.6 100.0 94.1 88.4  PLT 501* PLATELET CLUMPS NOTED ON SMEAR, COUNT APPEARS INCREASED 530* 490*   Cardiac Enzymes: No results for input(s): CKTOTAL, CKMB, CKMBINDEX, TROPONINI in the last 168 hours. CBG:  Recent Labs Lab 07/04/15 0759 07/04/15 1140 07/04/15 1658 07/04/15 2000 07/05/15 0824  GLUCAP 371* 317* 229* 347* 301*    Studies/Results: Dg Chest 2 View  07/03/2015   CLINICAL DATA:  Dry cough for approximately 10 days. Hypotension after dialysis. Tremors today.  EXAM: CHEST  2 VIEW  COMPARISON:  PA and lateral chest 06/24/2015.  FINDINGS: Right basilar atelectasis seen on the comparison examination has improved. The left lung is clear. Heart size is upper normal. No pneumothorax or pleural effusion.  IMPRESSION: No acute disease.  Improved right basilar atelectasis.  Electronically Signed   By: Inge Rise M.D.   On: 07/03/2015 12:40   Medications:   . sodium chloride   Intravenous Once  . sodium chloride   Intravenous Once  . amLODipine  10 mg Oral Daily  . aspirin EC  81 mg Oral Daily  . calcium acetate  1,334 mg Oral TID WC  . dextromethorphan-guaiFENesin  1 tablet Oral BID  . enoxaparin (LOVENOX) injection  30 mg Subcutaneous Q24H  . insulin aspart  0-9 Units Subcutaneous TID WC  . pneumococcal 23 valent vaccine  0.5 mL Intramuscular Tomorrow-1000

## 2015-07-05 NOTE — Progress Notes (Signed)
Paged IM team regarding GPC in BC x 1. Received return call. They will discuss with attending and proceed with appropriate orders.  Jocabed Cheese S. Alford Highland, PharmD, [facility] Clinical Staff Pharmacist Pager [phone]

## 2015-07-05 NOTE — Consult Note (Signed)
ANTIBIOTIC CONSULT NOTE - INITIAL  Pharmacy Consult for vancomycin Indication: bacteremia  No Known Allergies  Patient Measurements: Height: 5\' 8"  (172.7 cm) Weight: (!) 310 lb 13.6 oz (141 kg) IBW/kg (Calculated) : 68.4  Vital Signs: Temp: 98.4 F (36.9 C) (09/25 0826) Temp Source: Oral (09/25 0826) BP: 143/60 mmHg (09/25 0826) Pulse Rate: 91 (09/25 0826) Intake/Output from previous day: 09/24 0701 - 09/25 0700 In: 1990 [P.O.:1320; Blood:670] Out: 869  Intake/Output from this shift: Total I/O In: 240 [P.O.:240] Out: 0   Labs:  Recent Labs  07/04/15 1010 07/04/15 1800 07/05/15 0453 07/05/15 1416  WBC 30.1*  --  22.1* 20.6*  HGB 6.4*  --  7.3* 7.0*  PLT 530*  --  490* 520*  CREATININE 8.67* 6.76* 8.28*  --    Estimated Creatinine Clearance: 14.5 mL/min (by C-G formula based on Cr of 8.28). No results for input(s): VANCOTROUGH, VANCOPEAK, VANCORANDOM, GENTTROUGH, GENTPEAK, GENTRANDOM, TOBRATROUGH, TOBRAPEAK, TOBRARND, AMIKACINPEAK, AMIKACINTROU, AMIKACIN in the last 72 hours.   Assessment: 49 yo male 2.5 week hx of malaise with cough runny nose. On Azithromycin PTA  PMH: DM2, HTN, ESRD MWF HD  ID: abx for bacteremia. WBC 20.6, AF  Vancomycin 9/25>>  9/24 BC 1/2 GPC  Renal: SCr 8.28, MWF HD  Goal of Therapy:  Pre-HD level 15 - 25  Plan:  Vancomycin 2500 mg x 1 No further doses (can d/c if result as CONS)  Levester Fresh, PharmD, BCPS Clinical Pharmacist Pager [phone] 07/05/2015 4:18 PM

## 2015-07-05 NOTE — Progress Notes (Addendum)
Subjective: Doing well. Vitals stable overnight. Received 2 units pRBCs yesterday in HD. FOBT neg. No bleeding, coughing still a lot. No sob, has some rib/chest pain when coughs.    Objective: Vital signs in last 24 hours: Filed Vitals:   07/04/15 1625 07/04/15 1659 07/04/15 2001 07/05/15 0428  BP: 138/81 166/83 132/70 160/75  Pulse: 92 97 Seth 92  Temp: 97.4 F (36.3 C) 97.7 F (36.5 C) 97.5 F (36.4 C) 98.6 F (37 C)  TempSrc: Axillary Oral Axillary Oral  Resp: 17 19 20 18   Height:      Weight:      SpO2: 97% 97% 100% 99%   Weight change: 8 lb 13.1 oz (4 kg)  Intake/Output Summary (Last 24 hours) at 07/05/15 0807 Last data filed at 07/05/15 0600  Gross per 24 hour  Intake   1750 ml  Output    869 ml  Net    881 ml   General: sitting up in chair, no distress HEENT: EOMI, no scleral icterus Cardiac: RRR, no rubs, murmurs or gallops Pulm: mild exp wheezing, no resp distress, moving normal volumes of air Abd: soft, nontender, nondistended, BS present Ext: warm and well perfused, no pedal edema Neuro: alert and oriented X3, cranial nerves II-XII grossly intact  Lab Results: Basic Metabolic Panel:7  Recent Labs Lab 07/04/15 1800 07/05/15 0453  NA 128* 129*  K 3.5 4.4  CL 87* 87*  CO2 23 26  GLUCOSE 299* 307*  BUN 63* 77*  CREATININE 6.76* 8.28*  CALCIUM 9.1 9.3   Liver Function Tests:  Recent Labs Lab 07/04/15 1341 07/05/15 0453  AST 19 26  ALT 19 22  ALKPHOS 105 97  BILITOT 1.4* 1.3*  PROT 10.1* 9.2*  ALBUMIN 3.2* 3.2*   No results for input(s): LIPASE, AMYLASE in the last 168 hours. No results for input(s): AMMONIA in the last 168 hours. CBC:  Recent Labs Lab 07/04/15 0512 07/04/15 1010 07/05/15 0453  WBC 28.4* 30.1* 22.1*  NEUTROABS 24.4*  --  16.7*  HGB 6.8* 6.4* 7.3*  HCT 18.0* 17.7* 21.4*  MCV 100.0 94.1 88.4  PLT PLATELET CLUMPS NOTED ON SMEAR, COUNT APPEARS INCREASED 530* 490*   Cardiac Enzymes: No results for input(s):  CKTOTAL, CKMB, CKMBINDEX, TROPONINI in the last 168 hours. BNP: No results for input(s): PROBNP in the last 168 hours. D-Dimer: No results for input(s): DDIMER in the last 168 hours. CBG:  Recent Labs Lab 07/04/15 0019 07/04/15 0623 07/04/15 0759 07/04/15 1140 07/04/15 1658 07/04/15 2000  GLUCAP 453* 364* 371* 317* 229* 347*   Hemoglobin A1C: No results for input(s): HGBA1C in the last 168 hours. Fasting Lipid Panel: No results for input(s): CHOL, HDL, LDLCALC, TRIG, CHOLHDL, LDLDIRECT in the last 168 hours. Thyroid Function Tests: No results for input(s): TSH, T4TOTAL, FREET4, T3FREE, THYROIDAB in the last 168 hours. Coagulation: No results for input(s): LABPROT, INR in the last 168 hours. Anemia Panel:  Recent Labs Lab 07/04/15 1010 07/04/15 1800  TIBC  --  221*  IRON  --  193*  RETICCTPCT 4.1*  --    Urine Drug Screen: Drugs of Abuse  No results found for: LABOPIA, COCAINSCRNUR, LABBENZ, AMPHETMU, THCU, LABBARB  Alcohol Level: No results for input(s): ETH in the last 168 hours. Urinalysis: No results for input(s): COLORURINE, LABSPEC, PHURINE, GLUCOSEU, HGBUR, BILIRUBINUR, KETONESUR, PROTEINUR, UROBILINOGEN, NITRITE, LEUKOCYTESUR in the last 168 hours.  Invalid input(s): APPERANCEUR  Micro Results: Recent Results (from the past 240 hour(s))  MRSA PCR Screening  Status: None   Collection Time: 07/03/15  7:59 PM  Result Value Ref Range Status   MRSA by PCR NEGATIVE NEGATIVE Final    Comment:        The GeneXpert MRSA Assay (FDA approved for NASAL specimens only), is one component of a comprehensive MRSA colonization surveillance program. It is not intended to diagnose MRSA infection nor to guide or monitor treatment for MRSA infections.    Studies/Results: Dg Chest 2 View  07/03/2015   CLINICAL DATA:  Dry cough for approximately 10 days. Hypotension after dialysis. Tremors today.  EXAM: CHEST  2 VIEW  COMPARISON:  PA and lateral chest 06/24/2015.   FINDINGS: Right basilar atelectasis seen on the comparison examination has improved. The left lung is clear. Heart size is upper normal. No pneumothorax or pleural effusion.  IMPRESSION: No acute disease.  Improved right basilar atelectasis.   Electronically Signed   By: Inge Rise M.D.   On: 07/03/2015 12:40   Medications:  Scheduled Meds: . sodium chloride   Intravenous Once  . sodium chloride   Intravenous Once  . aspirin EC  81 mg Oral Daily  . calcium acetate  1,334 mg Oral TID WC  . dextromethorphan-guaiFENesin  1 tablet Oral BID  . enoxaparin (LOVENOX) injection  30 mg Subcutaneous Q24H  . insulin aspart  0-9 Units Subcutaneous TID WC  . pneumococcal 23 valent vaccine  0.5 mL Intramuscular Tomorrow-1000   Continuous Infusions:  PRN Meds:.ipratropium-albuterol Assessment/Plan: Active Problems:   DM II (diabetes mellitus, type II), controlled   Essential hypertension   End stage renal disease on dialysis   Normocytic anemia   OSA (obstructive sleep apnea)   Shortness of breath  49 y.o. AA [name] with past medical history of DM type II, HTN, ESRD with MWF HD, and OSA who presents to the ED with 2.5 weeks of feeling bad with what he describes as cold like symptoms, runny nose.  Leukocytosis - unclear etiology -WBC at admission of 18.4 with 78% PMNs.  Received prednisone 60mg  in the ED. WBC trended up to 30 yesterday. Today down to 22.1.  -afebrile, no tachycardia, no tachypnea, normotensive, no obvious signs of infection -bcx x2 pending from 9/24   Acute worsening on Chronic Anemia Has baseline anemia from CKD, hgb last outpatient HD 10.3 on 9/12. On EPO outpatient HD. -Hgb 7.5 on admission, down from 11.3 on 9/14.  Subsequent CBCs with Hgb 6.8 >> 6.4.  Now s/p 2 units only upto 7.3 -MCV 94.1. Iron 193, LDH 257, Reticulocyte % 4.1, Smear, haptoglobin pending. FOBT neg. Tibil essentially normal 1.3  - monitor CBC closely.   Cough - likely viral bronchitis. S/p Azithromycin  x5 days outpatient. -chest x-ray on admission compared to 9/14 shows improved right bibasilar atelectasis -strep pneumo, legionella pending - Robitussin-codeine  -continue Duonebs q4h   Diabetes mellitus -sliding scale insulin -check a1c  ESRD on M,W,F HD at [facility] - had HD 9/24 for blood tx. Appreciate nephrology recs.  Hypertension -resumed amlodipine.   Hyponatremia - corrected sodium 132.  -likely due to hyperglycemia combined with possible increased volume from renal disease -continue to monitor  Hyperkalemia - improved with kayex + HD -potassium was found to be 6.6 initially. Improved with kayex and also HD.   OSA -CPAP  Dispo: Disposition is deferred at this time, awaiting improvement of current medical problems.    The patient does have a current PCP (Tresa Garter, MD) and does not need an [facility] hospital follow-up  appointment after discharge.  The patient does not have transportation limitations that hinder transportation to clinic appointments.    LOS: 2 days   Dellia Nims, MD 07/05/2015, 8:07 AM

## 2015-07-06 ENCOUNTER — Inpatient Hospital Stay (HOSPITAL_COMMUNITY): Payer: Medicare Other

## 2015-07-06 ENCOUNTER — Other Ambulatory Visit (HOSPITAL_COMMUNITY): Payer: Medicare Other

## 2015-07-06 DIAGNOSIS — J40 Bronchitis, not specified as acute or chronic: Secondary | ICD-10-CM | POA: Diagnosis present

## 2015-07-06 DIAGNOSIS — D591 Other autoimmune hemolytic anemias: Secondary | ICD-10-CM

## 2015-07-06 DIAGNOSIS — R0600 Dyspnea, unspecified: Secondary | ICD-10-CM

## 2015-07-06 DIAGNOSIS — D649 Anemia, unspecified: Secondary | ICD-10-CM

## 2015-07-06 DIAGNOSIS — D5912 Cold autoimmune hemolytic anemia: Secondary | ICD-10-CM | POA: Diagnosis present

## 2015-07-06 LAB — RENAL FUNCTION PANEL
ALBUMIN: 3.3 g/dL — AB (ref 3.5–5.0)
ANION GAP: 18 — AB (ref 5–15)
BUN: 99 mg/dL — ABNORMAL HIGH (ref 6–20)
CALCIUM: 9.2 mg/dL (ref 8.9–10.3)
CO2: 22 mmol/L (ref 22–32)
Chloride: 86 mmol/L — ABNORMAL LOW (ref 101–111)
Creatinine, Ser: 10.11 mg/dL — ABNORMAL HIGH (ref 0.61–1.24)
GFR calc non Af Amer: 5 mL/min — ABNORMAL LOW (ref >60)
GFR, EST AFRICAN AMERICAN: 6 mL/min — AB (ref >60)
GLUCOSE: 286 mg/dL — AB (ref 65–99)
PHOSPHORUS: 7.9 mg/dL — AB (ref 2.5–4.6)
Potassium: 4 mmol/L (ref 3.5–5.1)
SODIUM: 126 mmol/L — AB (ref 135–145)

## 2015-07-06 LAB — CBC WITH DIFFERENTIAL/PLATELET
BASOS PCT: 1 %
Basophils Absolute: 0.1 10*3/uL (ref 0.0–0.1)
Eosinophils Absolute: 0.3 10*3/uL (ref 0.0–0.7)
Eosinophils Relative: 2 %
HCT: 19.2 % — ABNORMAL LOW (ref 39.0–52.0)
HEMOGLOBIN: 6.5 g/dL — AB (ref 13.0–17.0)
LYMPHS ABS: 3.4 10*3/uL (ref 0.7–4.0)
Lymphocytes Relative: 21 %
MCH: 30.1 pg (ref 26.0–34.0)
MCHC: 33.9 g/dL (ref 30.0–36.0)
MCV: 88.9 fL (ref 78.0–100.0)
MONO ABS: 1.1 10*3/uL — AB (ref 0.1–1.0)
Monocytes Relative: 7 %
NEUTROS ABS: 11.6 10*3/uL — AB (ref 1.7–7.7)
Neutrophils Relative %: 69 %
Platelets: 471 10*3/uL — ABNORMAL HIGH (ref 150–400)
RBC: 2.16 MIL/uL — AB (ref 4.22–5.81)
RDW: 15.1 % (ref 11.5–15.5)
WBC: 16.6 10*3/uL — AB (ref 4.0–10.5)

## 2015-07-06 LAB — DIRECT ANTIGLOBULIN TEST (NOT AT ARMC)
DAT, COMPLEMENT: POSITIVE
DAT, IgG: POSITIVE

## 2015-07-06 LAB — HEPATIC FUNCTION PANEL
ALK PHOS: 98 U/L (ref 38–126)
ALT: 20 U/L (ref 17–63)
AST: 21 U/L (ref 15–41)
Albumin: 3.3 g/dL — ABNORMAL LOW (ref 3.5–5.0)
BILIRUBIN DIRECT: 0.4 mg/dL (ref 0.1–0.5)
BILIRUBIN TOTAL: 1.6 mg/dL — AB (ref 0.3–1.2)
Indirect Bilirubin: 1.2 mg/dL — ABNORMAL HIGH (ref 0.3–0.9)
Total Protein: 9 g/dL — ABNORMAL HIGH (ref 6.5–8.1)

## 2015-07-06 LAB — GLUCOSE, CAPILLARY
Glucose-Capillary: 258 mg/dL — ABNORMAL HIGH (ref 65–99)
Glucose-Capillary: 297 mg/dL — ABNORMAL HIGH (ref 65–99)
Glucose-Capillary: 249 mg/dL — ABNORMAL HIGH (ref 65–99)

## 2015-07-06 LAB — RETICULOCYTES
RBC.: 2.34 MIL/uL — ABNORMAL LOW (ref 4.22–5.81)
RETIC COUNT ABSOLUTE: 124 10*3/uL (ref 19.0–186.0)
Retic Ct Pct: 5.3 % — ABNORMAL HIGH (ref 0.4–3.1)

## 2015-07-06 LAB — PREPARE RBC (CROSSMATCH)

## 2015-07-06 LAB — LACTATE DEHYDROGENASE: LDH: 321 U/L — ABNORMAL HIGH (ref 98–192)

## 2015-07-06 LAB — SAVE SMEAR

## 2015-07-06 MED ORDER — SODIUM CHLORIDE 0.9 % IV SOLN: Freq: Once | INTRAVENOUS | Status: DC

## 2015-07-06 MED ORDER — VANCOMYCIN HCL IN DEXTROSE 1-5 GM/200ML-% IV SOLN
1000.0000 mg | INTRAVENOUS | Status: DC
  Administered 2015-07-06: 1000 mg via INTRAVENOUS
  Filled 2015-07-06 (×2): qty 200

## 2015-07-06 MED ORDER — DARBEPOETIN ALFA 150 MCG/0.3ML IJ SOSY
PREFILLED_SYRINGE | INTRAMUSCULAR | Status: AC
  Administered 2015-07-06: 150 ug via INTRAVENOUS
  Filled 2015-07-06: qty 0.3

## 2015-07-06 MED ORDER — PERFLUTREN LIPID MICROSPHERE
1.0000 mL | INTRAVENOUS | Status: AC | PRN
  Administered 2015-07-06: 2 mL via INTRAVENOUS
  Filled 2015-07-06: qty 10

## 2015-07-06 MED ORDER — DOXERCALCIFEROL 4 MCG/2ML IV SOLN
INTRAVENOUS | Status: AC
  Administered 2015-07-06: 2.5 ug via INTRAVENOUS
  Filled 2015-07-06: qty 2

## 2015-07-06 NOTE — Progress Notes (Signed)
Subjective: Seen and examined in dialysis. Doing well, vitals stable overnight.  Feels well.  Misses his children.  FOBT negative, no signs of active bleeding.  Still coughing but feels it has improved.  Does report some continued ribcage pain with cough.  No shortness of breath, n/v/d, chest pain.  Objective: Vital signs in last 24 hours: Filed Vitals:   07/06/15 1100 07/06/15 1121 07/06/15 1321 07/06/15 1415  BP: 107/60 107/60    Pulse: 102 100 106   Temp:  97.4 F (36.3 C)    TempSrc:  Oral    Resp:  19    Height:      Weight:  310 lb 6.5 oz (140.8 kg)    SpO2:  93% 94% 100%   Weight change: -8 lb 12 oz (-3.968 kg)  Intake/Output Summary (Last 24 hours) at 07/06/15 1438 Last data filed at 07/06/15 1207  Gross per 24 hour  Intake    720 ml  Output   3120 ml  Net  -2400 ml   General: sitting up in chair, no distress HEENT: EOMI, no scleral icterus Cardiac: RRR, no rubs, murmurs or gallops Pulm: CTAB, no respiratory distress, moving normal volumes of air Abd: soft, nontender, nondistended, BS present Ext: warm and well perfused, no pedal edema Neuro: alert and oriented X3, cranial nerves II-XII grossly intact  Lab Results: Basic Metabolic Panel:7  Recent Labs Lab 07/05/15 0453 07/06/15 0709  NA 129* 126*  K 4.4 4.0  CL 87* 86*  CO2 26 22  GLUCOSE 307* 286*  BUN 77* 99*  CREATININE 8.28* 10.11*  CALCIUM 9.3 9.2  PHOS  --  7.9*   Liver Function Tests:  Recent Labs Lab 07/05/15 0453 07/06/15 0709 07/06/15 1235  AST 26  --  21  ALT 22  --  20  ALKPHOS 97  --  98  BILITOT 1.3*  --  1.6*  PROT 9.2*  --  9.0*  ALBUMIN 3.2* 3.3* 3.3*   No results for input(s): LIPASE, AMYLASE in the last 168 hours. No results for input(s): AMMONIA in the last 168 hours. CBC:  Recent Labs Lab 07/05/15 0453 07/05/15 1416 07/06/15 0336  WBC 22.1* 20.6* 16.6*  NEUTROABS 16.7*  --  11.6*  HGB 7.3* 7.0* 6.5*  HCT 21.4* 20.3* 19.2*  MCV 88.4 86.8 88.9  PLT 490* 520*  471*   Cardiac Enzymes: No results for input(s): CKTOTAL, CKMB, CKMBINDEX, TROPONINI in the last 168 hours. BNP: No results for input(s): PROBNP in the last 168 hours. D-Dimer: No results for input(s): DDIMER in the last 168 hours. CBG:  Recent Labs Lab 07/04/15 2000 07/05/15 0824 07/05/15 1221 07/05/15 1655 07/05/15 2007 07/06/15 1150  GLUCAP 347* 301* 325* 259* 283* 258*   Hemoglobin A1C: No results for input(s): HGBA1C in the last 168 hours. Fasting Lipid Panel: No results for input(s): CHOL, HDL, LDLCALC, TRIG, CHOLHDL, LDLDIRECT in the last 168 hours. Thyroid Function Tests: No results for input(s): TSH, T4TOTAL, FREET4, T3FREE, THYROIDAB in the last 168 hours. Coagulation: No results for input(s): LABPROT, INR in the last 168 hours. Anemia Panel:  Recent Labs Lab 07/04/15 1800 07/06/15 1130  TIBC 221*  --   IRON 193*  --   RETICCTPCT  --  5.3*   Urine Drug Screen: Drugs of Abuse  No results found for: LABOPIA, COCAINSCRNUR, LABBENZ, AMPHETMU, THCU, LABBARB  Alcohol Level: No results for input(s): ETH in the last 168 hours. Urinalysis: No results for input(s): COLORURINE, LABSPEC, [place], [place], [place],  BILIRUBINUR, KETONESUR, PROTEINUR, UROBILINOGEN, NITRITE, LEUKOCYTESUR in the last 168 hours.  Invalid input(s): APPERANCEUR  Micro Results: Recent Results (from the past 240 hour(s))  MRSA PCR Screening     Status: None   Collection Time: 07/03/15  7:59 PM  Result Value Ref Range Status   MRSA by PCR NEGATIVE NEGATIVE Final    Comment:        The GeneXpert MRSA Assay (FDA approved for NASAL specimens only), is one component of a comprehensive MRSA colonization surveillance program. It is not intended to diagnose MRSA infection nor to guide or monitor treatment for MRSA infections.   Culture, blood (routine x 2)     Status: None (Preliminary result)   Collection Time: 07/04/15  1:30 PM  Result Value Ref Range Status   Specimen Description  BLOOD DIALYSIS  Final   Special Requests BOTTLES DRAWN AEROBIC AND ANAEROBIC 10CC  Final   Culture  Setup Time   Final    GRAM POSITIVE COCCI IN CLUSTERS AEROBIC BOTTLE ONLY CRITICAL RESULT CALLED TO, READ BACK BY AND VERIFIED WITH: A MINTZ 07/05/15 @ 39 M VESTAL    Culture GRAM POSITIVE COCCI  Final   Report Status PENDING  Incomplete  Culture, blood (routine x 2)     Status: None (Preliminary result)   Collection Time: 07/04/15  1:45 PM  Result Value Ref Range Status   Specimen Description BLOOD DIALYSIS  Final   Special Requests BOTTLES DRAWN AEROBIC AND ANAEROBIC 10CC  Final   Culture  Setup Time   Final    GRAM POSITIVE COCCI IN CLUSTERS AEROBIC BOTTLE ONLY CONFIRMED BY V WILKINS CRITICAL RESULT CALLED TO, READ BACK BY AND VERIFIED WITH: K HAYWOOD 07/06/15 @1114  M VESTAL    Culture GRAM POSITIVE COCCI  Final   Report Status PENDING  Incomplete   Studies/Results: No results found. Medications:  Scheduled Meds: . sodium chloride   Intravenous Once  . sodium chloride   Intravenous Once  . amLODipine  10 mg Oral Daily  . aspirin EC  81 mg Oral Daily  . calcium acetate  1,334 mg Oral TID WC  . darbepoetin (ARANESP) injection - DIALYSIS  150 mcg Intravenous Q Mon-HD  . doxercalciferol  2.5 mcg Intravenous Q M,W,F-HD  . enoxaparin (LOVENOX) injection  30 mg Subcutaneous Q24H  . insulin aspart  0-5 Units Subcutaneous QHS  . insulin aspart  0-9 Units Subcutaneous TID WC  . insulin aspart  4 Units Subcutaneous TID WC  . ipratropium-albuterol  3 mL Nebulization Q6H  . multivitamin  1 tablet Oral QHS  . pneumococcal 23 valent vaccine  0.5 mL Intramuscular Tomorrow-1000  . vancomycin  1,000 mg Intravenous Q M,W,F-HD   Continuous Infusions:  PRN Meds:.guaiFENesin-codeine Assessment/Plan: Active Problems:   DM II (diabetes mellitus, type II), controlled   Essential hypertension   End stage renal disease on dialysis   Normocytic anemia   OSA (obstructive sleep apnea)    Shortness of breath  49 y.o. AA male with past medical history of DM type II, HTN, ESRD with MWF HD, and OSA who presents to the ED with 2.5 weeks of feeling bad with what he describes as cold like symptoms, runny nose.  Leukocytosis - unclear etiology -WBC at admission of 18.4 with 78% PMNs.  Received prednisone 60mg  in the ED.  -WBC trend: 18.4 >> 30.1 >> 22.1 >> 20.6 >> 16.6 w/ 69% PMNs -afebrile, no tachycardia, no tachypnea, normotensive, no obvious signs of infection -Blood cultures 2/2 positive for  GPC in clusters, aerobic bottles -Started on Vancomycin per pharmacy pending speciations and sensitivities -Check ECHO for possible endocarditis   Acute worsening on Chronic Anemia Has baseline anemia from CKD, hgb last outpatient HD 10.3 on 9/12. On EPO outpatient HD. -Hgb 7.5 on admission, down from 11.3 on 9/14.  Subsequent CBCs with Hgb 6.8 >> 6.4 >> s/p 2 units up to 7.3 >> 7.0 >> 6.5 today.  Will give 1 unit and check post-transfusion CBC -MCV 94.1. Iron 193, LDH 257, Reticulocyte % 4.1 >> 5.3, Smear has no schistocytes, haptoglobin 430. FOBT neg.  -T-bili 1.3 >> 1.6 -Consult to Hematology, appreciate their recommendations -unlikely a delayed transfusion reaction -will check EBV IgM and IgG, Mycoplasma -use warmer during transfusion to prevent cold-agglutinins -monitor CBC closely.   Cough - likely viral bronchitis  -Recently finished Azithromycin x5 days outpatient. -chest x-ray on admission compared to 9/14 shows improved right bibasilar atelectasis -strep pneumo negative, legionella pending -mycoplasma pending -Robitussin-codeine  -continue Duonebs q4h   Diabetes mellitus -sliding scale insulin -Hbg A1c pending  ESRD on M,W,F HD at [facility] -HD 9/24 for blood transfusion. Appreciate nephrology recs. -HD today  Hypertension -Continue amlodipine.   Hyponatremia  -likely due to hyperglycemia combined with possible increased volume from renal  disease -continue to monitor  Hyperkalemia - improved with kayexalate + HD -Potassium was found to be 6.6 initially. Improved with kayexalate and also HD.  -Potassium 4.0 today prior to HD  OSA -CPAP  Dispo: Disposition is deferred at this time, awaiting improvement of current medical problems.    The patient does have a current PCP (Tresa Garter, MD) and does not need an [facility] hospital follow-up appointment after discharge.  The patient does not have transportation limitations that hinder transportation to clinic appointments.    LOS: 3 days   Jule Ser, DO 07/06/2015, 2:38 PM

## 2015-07-06 NOTE — Progress Notes (Signed)
Pt. States he can place cpap on himself. RT informed pt. To notify if he needed any assistance. 

## 2015-07-06 NOTE — Progress Notes (Signed)
Date: 07/06/2015  Patient name: Seth [name]  Medical record number: [id number]  Date of birth: [birth year]    Date of Admission:  07/03/2015   Total days of antibiotics 2        Day 2 vancomycin           ID: Seth [name] is a 49 y.o. male with ESRD on HD, well controlled DM admitted for malaise flu like illness found to have leukocytosis of 18K, new onset anemia. Initially treated for asthma with steroids. Now found to have bacteremia. insufficient response to blood transfusion Active Problems:   DM II (diabetes mellitus, type II), controlled   Essential hypertension   End stage renal disease on dialysis   Normocytic anemia   OSA (obstructive sleep apnea)   Shortness of breath    Subjective: Feels improved no shortness of breath. Denies fever.   O: started on vancomycin due to [facility] on blood cultures  Medications:  . sodium chloride   Intravenous Once  . sodium chloride   Intravenous Once  . sodium chloride   Intravenous Once  . amLODipine  10 mg Oral Daily  . aspirin EC  81 mg Oral Daily  . calcium acetate  1,334 mg Oral TID WC  . darbepoetin (ARANESP) injection - DIALYSIS  150 mcg Intravenous Q Mon-HD  . doxercalciferol  2.5 mcg Intravenous Q M,W,F-HD  . enoxaparin (LOVENOX) injection  30 mg Subcutaneous Q24H  . insulin aspart  0-5 Units Subcutaneous QHS  . insulin aspart  0-9 Units Subcutaneous TID WC  . insulin aspart  4 Units Subcutaneous TID WC  . ipratropium-albuterol  3 mL Nebulization Q6H  . multivitamin  1 tablet Oral QHS  . pneumococcal 23 valent vaccine  0.5 mL Intramuscular Tomorrow-1000  . vancomycin  1,000 mg Intravenous Q M,W,F-HD    Objective: Vital signs in last 24 hours: Temp:  [97 F (36.1 C)-98.1 F (36.7 C)] 97.4 F (36.3 C) (09/26 1121) Pulse Rate:  [82-106] 106 (09/26 1321) Resp:  [17-20] 19 (09/26 1121) BP: (106-164)/(49-88) 107/60 mmHg (09/26 1121) SpO2:  [93 %-100 %] 100 % (09/26 1415) FiO2 (%):  [21 %] 21 % (09/26  0214) Weight:  [302 lb 1.6 oz (137.032 kg)-316 lb 9.3 oz (143.6 kg)] 310 lb 6.5 oz (140.8 kg) (09/26 1121)  Physical Exam  Constitutional: He is oriented to person, place, and time. He appears well-developed and well-nourished. No distress. Sitting in chair during HD HENT:  Mouth/Throat: Oropharynx is clear and moist. No oropharyngeal exudate.  Cardiovascular: Normal rate, regular rhythm and normal heart sounds. Exam reveals no gallop and no friction rub.  No murmur heard.  Pulmonary/Chest: Effort normal and breath sounds normal. No respiratory distress. He has no wheezes.  Abdominal: Soft. Bowel sounds are normal. He exhibits no distension. There is no tenderness.  Lymphadenopathy:  He has no cervical adenopathy.  Neurological: He is alert and oriented to person, place, and time.  Skin: Skin is warm and dry. No rash noted. No erythema.  Psychiatric: He has a normal mood and affect. His behavior is normal.     Lab Results  Recent Labs  07/05/15 0453 07/05/15 1416 07/06/15 0336 07/06/15 0709  WBC 22.1* 20.6* 16.6*  --   HGB 7.3* 7.0* 6.5*  --   HCT 21.4* 20.3* 19.2*  --   NA 129*  --   --  126*  K 4.4  --   --  4.0  CL 87*  --   --  86*  CO2 26  --   --  22  BUN 77*  --   --  99*  CREATININE 8.28*  --   --  10.11*   Liver Panel  Recent Labs  07/04/15 1341 07/05/15 0453 07/06/15 0709 07/06/15 1235  PROT 10.1* 9.2*  --  9.0*  ALBUMIN 3.2* 3.2* 3.3* 3.3*  AST 19 26  --  21  ALT 19 22  --  20  ALKPHOS 105 97  --  98  BILITOT 1.4* 1.3*  --  1.6*  BILIDIR 0.3  --   --  0.4  IBILI 1.1*  --   --  1.2*    Microbiology: 9/23 blood cx GPCC x 2  Studies/Results: No results found.   Assessment/Plan: Gram positive bacteremia = will repeat blood culture. Continue on vancomycin until we get speciation. May need TTE and further work up if staph aureus. Possible etiology of why he felt so poorly, leukocytosis on admit, though he does report viral/uri clinical  picture  Anemia = patient did not have appropriate increase in his Hgb after blood transfusion and will likely need further work up for hemolysis with increased LDH and slight increase in indirect bili. Will consult hematology for further recs. Getting aranesp injection today.   ESRD on HD = continue with hemodialysis m-w-f, appreciate renal recs  Cough from URI = continue with supportive care   [facility]ax[facility] Flattery [facility] Cell: [phone] Pager: [phone]  07/06/2015, 4:36 PM      This patient's plan of care was discussed with the house staff. Please see their note for complete details. I concur with their findings.   Carlyle Basques, MD 07/06/2015, 4:36 PM

## 2015-07-06 NOTE — Progress Notes (Signed)
Inpatient Diabetes Program Recommendations  AACE/ADA: New Consensus Statement on Inpatient Glycemic Control (2015)  Target Ranges:  Prepandial:   less than 140 mg/dL      Peak postprandial:   less than 180 mg/dL (1-2 hours)      Critically ill patients:  140 - 180 mg/dL   Review of Glycemic Control  Results for Seth [name], [name] (MRN [id number]) as of 07/06/2015 15:22  Ref. Range 07/05/2015 08:24 07/05/2015 12:21 07/05/2015 16:55 07/05/2015 20:07 07/06/2015 11:50  Glucose-Capillary Latest Ref Range: 65-99 mg/dL 301 (H) 325 (H) 259 (H) 283 (H) 258 (H)     Inpatient Diabetes Program Recommendations:  Insulin - Basal: Add Lantus 10 units QHS HgbA1C: Pending   Will continue to follow. Thank you. Lorenda Peck, RD, LDN, CDE Inpatient Diabetes Coordinator [phone]

## 2015-07-06 NOTE — Progress Notes (Addendum)
Hemodialysis- Call from lab. Positive blood cultures (+cocci in clusters) 2nd set. Reported to MD.

## 2015-07-06 NOTE — Consult Note (Addendum)
Referring MD: Dr. Dellia Nims  PCP:  Angelica Chessman, MD   Reason for Referral: Acute anemia with poor response to transfusion   Chief Complaint  Patient presents with  . Shortness of Breath    HPI:  Pleasant, morbidly obese, 49 year old man admitted  here on September 23.  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 He was in the [employer] for 10 years. He traveled around this country, was in [place], and in the [place]. He was told he had malaria when he was in [place]. He was hypertensive. He developed diabetes in his 49s while in the [employer]. He has had most of his medical care at the [facility] By his account, about 2-1/2 years ago he developed acute renal failure and had to go on dialysis. Around the same time his diabetes progressed and he began insulin. He states that he never had a kidney biopsy.  He had a flu vaccine about 2 weeks ago. He began to develop a dry cough, polymyalgias, but no fever. No nausea vomiting or diarrhea. No skin rash. He was given a prescription for azithromycin which he took for about 4 days. He then developed significant fatigue and dyspnea. He presented to the emergency department right after his dialysis treatment for further evaluation.  He reports that he had a previous bad reaction to a flu vaccine with similar symptoms. He has no prior history of hepatitis, or yellow jaundice. No history of mononucleosis. Initial vital signs with temperature 98.1 orally, pulse 67, respirations 16, blood pressure 91/70. Oxygen saturation 95% on room air. other than his morbid obesity and some scattered wheezes and rhonchi over the lungs, admission exam unremarkable. He has been afebrile since admission.  However, survey blood cultures drawn on September 24 on now growing gram-positive cocci in clusters. He has had a persistent, nonproductive, hacking, cough.  A number of laboratory abnormalities noted on admission. Hemoglobin down  from his recent baseline of 11.3 g to admission value of 7.5. Subsequent fall to 6.8. He did receive some steroids in the emergency department for his respiratory symptoms. Initial white count was 18,400 not clear if this was done before the steroids. Platelet count was 501,000 up from September 14 emergency room visit when it was 283,000.  Total bilirubin 1.4, indirect 1.1, direct 0.3, LDH 257 (normal less than 192), reticulocyte count 4.1%, initial potassium 4. 2 repeat the next day 6.6. Initial BUN 37 creatinine 5.5. Serum total protein 10.1, repeat 9.2.  He was given a blood transfusion but peak a hemoglobin only up to 7.3 g and fell back to 6.5 g within 24 hours. He has had no recent blood transfusions prior to this admission. In fact, he does not recall ever receiving an erythropoietin stimulating agent with his dialysis. He did receive a dose of Aranesp today for the first time.  No family history of any blood disorders.    Past Medical History  Diagnosis Date  . Diabetes mellitus   . Hypertension   . Chronic kidney disease   . 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 Sleep apnea   : Emergency tracheostomy for acute respiratory failure 08/20/2013.   Past Surgical History  Procedure Laterality Date  . Av fistula placement  05/03/2012    Procedure: ARTERIOVENOUS (AV) FISTULA CREATION;  Surgeon: Mal [name], MD;  Location: [place];  Service: Vascular;  Laterality: Right;  . Tracheostomy tube  placement N/A 08/20/2013    Procedure: TRACHEOSTOMY Revision;  Surgeon: Melida Quitter, MD;  Location: [place];  Service: ENT;  Laterality: N/A;  :  . sodium chloride   Intravenous Once  . sodium chloride   Intravenous Once  . sodium chloride   Intravenous Once  . amLODipine  10 mg Oral Daily  . aspirin EC  81 mg Oral Daily  . calcium acetate  1,334 mg Oral TID WC  . darbepoetin (ARANESP) injection - DIALYSIS  150 mcg Intravenous Q Mon-HD  .  doxercalciferol  2.5 mcg Intravenous Q M,W,F-HD  . enoxaparin (LOVENOX) injection  30 mg Subcutaneous Q24H  . insulin aspart  0-5 Units Subcutaneous QHS  . insulin aspart  0-9 Units Subcutaneous TID WC  . insulin aspart  4 Units Subcutaneous TID WC  . ipratropium-albuterol  3 mL Nebulization Q6H  . multivitamin  1 tablet Oral QHS  . pneumococcal 23 valent vaccine  0.5 mL Intramuscular Tomorrow-1000  . vancomycin  1,000 mg Intravenous Q M,W,F-HD  : He was given muscle relaxants a few weeks ago in the emergency department when he was evaluated for back pain.   No Known Allergies:  Family History  Problem Relation Age of Onset  . Diabetes Mother   . Diabetes Sister   . Diabetes Brother   : He has 4 sisters who are healthy.   Social History   Social History  . Marital Status:  married     Spouse Name: N/A  . Number of Children:  5 healthy boys  . Years of Education:  through high school    Occupational History  .  currently disabled. 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 .   Social History Main Topics  . Smoking status: Never Smoker   . Smokeless tobacco: Never Used  . Alcohol Use: 0.6 oz/week    1 Cans of beer per week  . Drug Use: Yes    Special: Marijuana     Comment: ocassionaly   . Sexual Activity: Not Currently  He has been vaccinated against hepatitis B. Other Topics Concern  . 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  Divorce with 5 children, 3 boys and 2 girls.  Lives in [place].   :  ROS: See HPI Genitourinary:   Vitals: Filed Vitals:   07/06/15 1657  BP: 123/74  Pulse: 102  Temp: 97.7 F (36.5 C)  Resp:     PHYSICAL EXAM: General appearance: pleasant, morbidly obese, African American man HEENT: multiple carious teeth -  some only stumps; he is wearing earrings bilaterally. Pharynx no erythema, exudate, mass, or ulcer. Lymph Nodes: No cervical, supraclavicular, or axillary adenopathy Resp: Lungs clear to auscultation resonant to percussion Cardio: Regular cardiac rhythm no murmur gallop or rub Vascular: Carotids 2+ no bruits; vascular graft right forearm with palpable bruit Breasts: GI: Abdomen soft, obese, nontender, no obvious mass or organomegaly GU: Extremities: 1-2 plus ankle edema Neurologic: He is alert and oriented, cranial nerves grossly normal, PERRLA, motor strength 5 over 5 Skin: No rash or ecchymoses. Small tattoo right arm  Labs:   Recent Labs  07/05/15 1416 07/06/15 0336  WBC 20.6* 16.6*  HGB 7.0* 6.5*  HCT 20.3* 19.2*  PLT 520* 471*  Recent Labs  07/05/15 0453 07/06/15 0709  NA 129* 126*  K 4.4 4.0  CL 87* 86*  CO2 26 22  GLUCOSE 307* 286*  BUN 77* 99*  CREATININE 8.28* 10.11*  CALCIUM 9.3 9.2    Blood smear review:  Significant rouleaux. 2+ spherocytes. No schistocytes. 2+ polychromasia. Marked elevation of platelets. Neutrophils and lymphocytes appear mature.  Blood bank evaluation: Positive for the presence of a cold agglutinin. Direct antiglobulin test initially weakly positive for IgG and positive for complement. The cold agglutinin has anti-I specificity. It does not react with cord blood cells.(i) The cold agglutinin disappears when the blood is prewarmed. No abnormal antibodies are washed off the cells (negative eluate).  Images Studies/Results:  Normal chest x-ray on admission.       Assessment: Active Problems:   DM II (diabetes mellitus, type II), controlled   Essential hypertension   End stage renal disease on dialysis   Normocytic anemia   OSA (obstructive sleep apnea)   Shortness of breath   Impression: Complex case. He had a viral prodrome which began coincidently with an influenza vaccine. There is a history of previous reaction to  vaccines. He has had a persistent nonproductive cough. He has developed a cold agglutinin. Although this appears to be nonspecific by the blood bank's analysis, it is still possible that this was induced by the flu vaccine or another common viral or non-viral infection. Mononucleosis (EBV) and or mycoplasma would be the most common etiology of an IgM cold agglutinin associated with  hemolysis. In this particular case, I would have to consider that an abnormal antibody reaction was provoked by an atypical reaction to the influenza vaccine. On the other hand, benign cold agglutinins are also seen and usually only troublesome for the blood bank. They were able to cross match units for him when they warmed the sample. This plus the fact that there was no antibody seen on the eluate points to a more benign etiology of the cold agglutinin but does not explain his poor response to transfusion. There does appear to be at least low-grade hemolysis present. We do not appear to be dealing with a delayed hemolytic transfusion reaction based on the blood bank evaluation.  Current situation further complicated by the positive blood cultures for presumably staph. He has multiple carious teeth which may be the source of this infection. Certainly bacteremia could cause bone marrow suppression and partially explain his poor response to transfusion. There is no evidence for overt DIC. He has been started on vancomycin.    Recommendation:  I would use a blood warmer for any subsequent transfusions. I do not see any contraindication to additional transfusions if necessary. I would check a baseline DIC panel. I would check cold agglutinin titers. In general benign cold agglutinins are low titer and malignant cold agglutinins are high titer. I would check IgG and IgM titers against EBV and mycoplasma.  I will talk with the lab about obtaining the specimen so that they can  process them properly. Specimen has to be transported  to the lab warmed for reliable results.   Alyson Locket [facility] 07/06/2015, 6:58 PM

## 2015-07-06 NOTE — Progress Notes (Signed)
Subjective: Patient had HD today, he is frustrated that his hemoglobin is down again today and he will have to stay in the hospital. The patient reports he really misses his family, but they did come visit him yesterday. The patient continues to deny any bleeding or any blood in his stool, but he does report persistent coughing. . No sob, has some rib/chest pain when coughs.    Objective: Vital signs in last 24 hours: Filed Vitals:   07/06/15 1030 07/06/15 1100 07/06/15 1121 07/06/15 1321  BP: 135/68 107/60 107/60   Pulse: 101 102 100 106  Temp:   97.4 F (36.3 C)   TempSrc:   Oral   Resp:   19   Height:      Weight:   140.8 kg (310 lb 6.5 oz)   SpO2:   93% 94%   Weight change: -3.968 kg (-8 lb 12 oz)  Intake/Output Summary (Last 24 hours) at 07/06/15 1344 Last data filed at 07/06/15 1207  Gross per 24 hour  Intake    960 ml  Output   3120 ml  Net  -2160 ml   General: sitting up in chair at dialysis, no distress but tearful HEENT: EOMI, no scleral icterus Cardiac: RRR, no rubs, murmurs or gallops Pulm: mild exp wheezing, normal work of breathing.  Abd: soft, nontender, nondistended, BS present Ext: warm and well perfused, no pedal edema Neuro: alert and oriented X3  Lab Results: Basic Metabolic Panel:7  Recent Labs Lab 07/05/15 0453 07/06/15 0709  NA 129* 126*  K 4.4 4.0  CL 87* 86*  CO2 26 22  GLUCOSE 307* 286*  BUN 77* 99*  CREATININE 8.28* 10.11*  CALCIUM 9.3 9.2  PHOS  --  7.9*   Liver Function Tests:  Recent Labs Lab 07/04/15 1341 07/05/15 0453 07/06/15 0709  AST 19 26  --   ALT 19 22  --   ALKPHOS 105 97  --   BILITOT 1.4* 1.3*  --   PROT 10.1* 9.2*  --   ALBUMIN 3.2* 3.2* 3.3*   No results for input(s): LIPASE, AMYLASE in the last 168 hours. No results for input(s): AMMONIA in the last 168 hours. CBC:  Recent Labs Lab 07/05/15 0453 07/05/15 1416 07/06/15 0336  WBC 22.1* 20.6* 16.6*  NEUTROABS 16.7*  --  11.6*  HGB 7.3* 7.0* 6.5*    HCT 21.4* 20.3* 19.2*  MCV 88.4 86.8 88.9  PLT 490* 520* 471*   Cardiac Enzymes: No results for input(s): CKTOTAL, CKMB, CKMBINDEX, TROPONINI in the last 168 hours. BNP: No results for input(s): PROBNP in the last 168 hours. D-Dimer: No results for input(s): DDIMER in the last 168 hours. CBG:  Recent Labs Lab 07/04/15 2000 07/05/15 0824 07/05/15 1221 07/05/15 1655 07/05/15 2007 07/06/15 1150  GLUCAP 347* 301* 325* 259* 283* 258*   Hemoglobin A1C: No results for input(s): HGBA1C in the last 168 hours. Fasting Lipid Panel: No results for input(s): CHOL, HDL, LDLCALC, TRIG, CHOLHDL, LDLDIRECT in the last 168 hours. Thyroid Function Tests: No results for input(s): TSH, T4TOTAL, FREET4, T3FREE, THYROIDAB in the last 168 hours. Coagulation: No results for input(s): LABPROT, INR in the last 168 hours. Anemia Panel:  Recent Labs Lab 07/04/15 1800 07/06/15 1130  TIBC 221*  --   IRON 193*  --   RETICCTPCT  --  5.3*   Urine Drug Screen: Drugs of Abuse  No results found for: LABOPIA, COCAINSCRNUR, LABBENZ, AMPHETMU, THCU, LABBARB  Alcohol Level: No results for  input(s): ETH in the last 168 hours. Urinalysis: No results for input(s): COLORURINE, LABSPEC, PHURINE, GLUCOSEU, HGBUR, BILIRUBINUR, KETONESUR, PROTEINUR, UROBILINOGEN, NITRITE, LEUKOCYTESUR in the last 168 hours.  Invalid input(s): APPERANCEUR  Micro Results: Recent Results (from the past 240 hour(s))  MRSA PCR Screening     Status: None   Collection Time: 07/03/15  7:59 PM  Result Value Ref Range Status   MRSA by PCR NEGATIVE NEGATIVE Final    Comment:        The GeneXpert MRSA Assay (FDA approved for NASAL specimens only), is one component of a comprehensive MRSA colonization surveillance program. It is not intended to diagnose MRSA infection nor to guide or monitor treatment for MRSA infections.   Culture, blood (routine x 2)     Status: None (Preliminary result)   Collection Time: 07/04/15  1:30  PM  Result Value Ref Range Status   Specimen Description BLOOD DIALYSIS  Final   Special Requests BOTTLES DRAWN AEROBIC AND ANAEROBIC 10CC  Final   Culture  Setup Time   Final    GRAM POSITIVE COCCI IN CLUSTERS AEROBIC BOTTLE ONLY CRITICAL RESULT CALLED TO, READ BACK BY AND VERIFIED WITH: A MINTZ 07/05/15 @ 55 M VESTAL    Culture GRAM POSITIVE COCCI  Final   Report Status PENDING  Incomplete  Culture, blood (routine x 2)     Status: None (Preliminary result)   Collection Time: 07/04/15  1:45 PM  Result Value Ref Range Status   Specimen Description BLOOD DIALYSIS  Final   Special Requests BOTTLES DRAWN AEROBIC AND ANAEROBIC 10CC  Final   Culture  Setup Time   Final    GRAM POSITIVE COCCI IN CLUSTERS AEROBIC BOTTLE ONLY CONFIRMED BY V WILKINS CRITICAL RESULT CALLED TO, READ BACK BY AND VERIFIED WITH: K HAYWOOD 07/06/15 @1114  M VESTAL    Culture GRAM POSITIVE COCCI  Final   Report Status PENDING  Incomplete   Studies/Results: No results found. Medications:  Scheduled Meds: . sodium chloride   Intravenous Once  . sodium chloride   Intravenous Once  . amLODipine  10 mg Oral Daily  . aspirin EC  81 mg Oral Daily  . calcium acetate  1,334 mg Oral TID WC  . darbepoetin (ARANESP) injection - DIALYSIS  150 mcg Intravenous Q Mon-HD  . doxercalciferol  2.5 mcg Intravenous Q M,W,F-HD  . enoxaparin (LOVENOX) injection  30 mg Subcutaneous Q24H  . insulin aspart  0-5 Units Subcutaneous QHS  . insulin aspart  0-9 Units Subcutaneous TID WC  . insulin aspart  4 Units Subcutaneous TID WC  . ipratropium-albuterol  3 mL Nebulization Q6H  . multivitamin  1 tablet Oral QHS  . pneumococcal 23 valent vaccine  0.5 mL Intramuscular Tomorrow-1000  . vancomycin  1,000 mg Intravenous Q M,W,F-HD   Continuous Infusions:  PRN Meds:.guaiFENesin-codeine Assessment/Plan: Active Problems:   DM II (diabetes mellitus, type II), controlled   Essential hypertension   End stage renal disease on dialysis    Normocytic anemia   OSA (obstructive sleep apnea)   Shortness of breath  49 y.o. AA male with past medical history of DM type II, HTN, ESRD with MWF HD, and OSA who presents to the ED with 2.5 weeks of feeling bad with what he describes as cold like symptoms, runny nose.  Leukocytosis - unclear etiology -WBC at admission of 18.4 with 78% PMNs.  Received prednisone 60mg  in the ED. WBC trended up to 30 yesterday. Today down to 22.1.  -afebrile, no tachycardia,  no tachypnea, normotensive, no obvious signs of infection -bcx x2 pending from 9/24: 2nd blood culture from hemodialysis showed gram positive organisms in clusters. - started on vancomycin    Acute worsening on Chronic Anemia Has baseline anemia from CKD, hgb last outpatient HD 10.3 on 9/12. On EPO outpatient HD. -Hgb 7.5 on admission, down from 11.3 on 9/14.  Subsequent CBCs with Hgb 6.8 >> 6.4>s/p 2 unit transfusion> 7.3>>7.0>>6.5 -MCV 94.1. Iron 193, LDH 257>> 321, Reticulocyte % 4.1>> 5.3, Smear- no schistocytes seen, haptoglobin 430. FOBT neg. Tibil 1.3 >>1.6 , urine hemosiderin is pending - C/s hematology on 9/26, appreciate recommendations.             - unlikely this is a delayed transfusion reaction.   - will order EBV and Mycoplasma IgM and IgG, as well as C3/C4             - will use warmer during transfusion to prevent cold-agglutanins          - continue to monitor CBC closely.   Cough - likely viral bronchitis. S/p Azithromycin x5 days outpatient. -chest x-ray on admission compared to 9/14 shows improved right bibasilar atelectasis -strep pneumo, legionella pending - Robitussin-codeine  -continue Duonebs q4h   Diabetes mellitus -sliding scale insulin -a1c pending since 9/23 - elevated daily BG ranging 229-347  ESRD on M,W,F HD at [facility] - HD 9/24 for blood tx. Appreciate nephrology recs. - HD 9/26  Inpatient, will continue MWF   Hypertension -resumed amlodipine.   Hyponatremia - corrected sodium  132.  -likely due to hyperglycemia combined with possible increased volume from renal disease -continue to monitor  Hyperkalemia - improved with kayex + HD -potassium was found to be 6.6 initially. Improved with kayex and also HD.  - K is 4.0 on 9/26 prior to dialysis   OSA -CPAP nightly   Dispo: Disposition is deferred at this time, awaiting improvement of current medical problems.    The patient does have a current PCP (Tresa Garter, MD) and does not need an [facility] hospital follow-up appointment after discharge.  The patient does not have transportation limitations that hinder transportation to clinic appointments.    LOS: 3 days   Colin Ina, Med Student 07/06/2015, 1:44 PM

## 2015-07-06 NOTE — Progress Notes (Signed)
Hemodialysis- Hgb 6.5 today. Reported value to Dr. Justin Mend. Request hematology consult. Order placed via telephone order. Will hold off on transfusion for now per Dr. Justin Mend. Continue to monitor patient.

## 2015-07-06 NOTE — Progress Notes (Signed)
ANTIBIOTIC CONSULT NOTE  Pharmacy Consult for vancomycin Indication: bacteremia  No Known Allergies  Patient Measurements: Height: 5\' 8"  (172.7 cm) Weight: (!) 316 lb 9.3 oz (143.6 kg) IBW/kg (Calculated) : 68.4 Adjusted Body Weight:   Vital Signs: Temp: 97 F (36.1 C) (09/26 0704) Temp Source: Oral (09/26 0704) BP: 130/88 mmHg (09/26 1000) Pulse Rate: 96 (09/26 1000) Intake/Output from previous day: 09/25 0701 - 09/26 0700 In: 1200 [P.O.:1200] Out: 0  Intake/Output from this shift:    Labs:  Recent Labs  07/04/15 1800 07/05/15 0453 07/05/15 1416 07/06/15 0336 07/06/15 0709  WBC  --  22.1* 20.6* PENDING  --   HGB  --  7.3* 7.0* 6.5*  --   PLT  --  490* 520* 471*  --   CREATININE 6.76* 8.28*  --   --  10.11*   Estimated Creatinine Clearance: 12 mL/min (by C-G formula based on Cr of 10.11). No results for input(s): VANCOTROUGH, VANCOPEAK, VANCORANDOM, GENTTROUGH, GENTPEAK, GENTRANDOM, TOBRATROUGH, TOBRAPEAK, TOBRARND, AMIKACINPEAK, AMIKACINTROU, AMIKACIN in the last 72 hours.    Medical History: Past Medical History  Diagnosis Date  . Diabetes mellitus   . Hypertension   . Chronic kidney disease   . 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 Sleep apnea     Medications:  Prescriptions prior to admission  Medication Sig Dispense Refill Last Dose  . amLODipine (NORVASC) 10 MG tablet Take 1 tablet (10 mg total) by mouth daily. 30 tablet 0 07/03/2015 at Unknown time  . aspirin EC 81 MG tablet Take 81 mg by mouth daily.   07/03/2015 at Unknown time  . calcium acetate (PHOSLO) 667 MG capsule Take 2 capsules (1,334 mg total) by mouth 3 (three) times daily with meals. 180 capsule 0 07/03/2015 at Unknown time  . diazepam (VALIUM) 2 MG tablet Take 1 tablet (2 mg total) by mouth every 8 (eight) hours as needed for muscle spasms. 30 tablet 0 Past Month at Unknown time  . insulin aspart (NOVOLOG) 100 UNIT/ML injection Inject  0-15 Units into the skin 3 (three) times daily with meals. CBG < 70: drink orange juice and recheck glucose or use glucose tabs CBG > 400: call MD   CBG 70 - 120: 0 units CBG 121 - 150: 3 units CBG 151 - 200: 4 units CBG 201 - 250: 7 units CBG 251 - 300: 11 units CBG 301 - 350: 15 units CBG 351 - 400: 20 units 1 vial 12 07/02/2015 at Unknown time  . Vitamin D, Ergocalciferol, (DRISDOL) 50000 UNITS CAPS capsule Take 1 capsule (50,000 Units total) by mouth every 7 (seven) days. *takes on Friday* 30 capsule 0 Past Week at Unknown time   Assessment: 49 yo male on vancomycin day #2 for bacteremia. Current blood cultures are 2/2 GPC. Patient received vancomycin 2500 mg load yesterday. Pt is also ESRD on HD-MWF. Leukocytosis to 20.6, pt remains afebrile. Receiving 1 unit pRBC for hgb 6.5.   Goal of Therapy:  Pre-HD vancomycin random level 15-25 mcg/ml    Plan:  -Vancomycin 1 g IV qMWF with HD -Monitor culture speciation and sensitivities  -Monitor HD schedule -Will check VR if MRSA    Hughes Better, PharmD, BCPS Clinical Pharmacist Pager: [phone] 07/06/2015 10:28 AM

## 2015-07-06 NOTE — Progress Notes (Signed)
CRITICAL VALUE ALERT  Critical value received:  Hgb 6.5  Date of notification:  07/06/15  Time of notification:  0740  Critical value read back:yes  Nurse who received alert:  Blase Mess RN  MD notified (1st page):  Justin Mend  Time of first page: 0745  MD notified (2nd page):  Time of second page:  Responding MD:  Justin Mend  Time MD responded: 0800

## 2015-07-06 NOTE — Procedures (Signed)
I have seen and examined this patient and agree with the plan of care. Patient seen in dialysis   Hb 6.5 after transfusing 2 units. This is curious as hemolytic labs are not revealing and no overt blood loss . Reticulocyte count appears to be increased although maybe suboptimal.     WEBB,[place] W 07/06/2015, 12:29 PM

## 2015-07-06 NOTE — Progress Notes (Signed)
Echocardiogram 2D Echocardiogram with Definity has been performed.  Tresa Res 07/06/2015, 3:43 PM

## 2015-07-07 ENCOUNTER — Inpatient Hospital Stay (HOSPITAL_COMMUNITY): Payer: Medicare Other

## 2015-07-07 LAB — CULTURE, BLOOD (ROUTINE X 2)

## 2015-07-07 LAB — CBC WITH DIFFERENTIAL/PLATELET
BASOS PCT: 1 %
Basophils Absolute: 0.1 10*3/uL (ref 0.0–0.1)
Eosinophils Absolute: 0.3 10*3/uL (ref 0.0–0.7)
Eosinophils Relative: 2 %
HEMATOCRIT: 21.9 % — AB (ref 39.0–52.0)
HEMOGLOBIN: 7.4 g/dL — AB (ref 13.0–17.0)
LYMPHS ABS: 2.7 10*3/uL (ref 0.7–4.0)
Lymphocytes Relative: 16 %
MCH: 30.3 pg (ref 26.0–34.0)
MCHC: 33.8 g/dL (ref 30.0–36.0)
MCV: 89.8 fL (ref 78.0–100.0)
MONO ABS: 1.3 10*3/uL — AB (ref 0.1–1.0)
MONOS PCT: 8 %
NEUTROS ABS: 12.9 10*3/uL — AB (ref 1.7–7.7)
NEUTROS PCT: 75 %
Platelets: 454 10*3/uL — ABNORMAL HIGH (ref 150–400)
RBC: 2.44 MIL/uL — ABNORMAL LOW (ref 4.22–5.81)
RDW: 15 % (ref 11.5–15.5)
WBC: 17.3 10*3/uL — ABNORMAL HIGH (ref 4.0–10.5)

## 2015-07-07 LAB — BASIC METABOLIC PANEL
Anion gap: 14 (ref 5–15)
BUN: 66 mg/dL — AB (ref 6–20)
CHLORIDE: 89 mmol/L — AB (ref 101–111)
CO2: 25 mmol/L (ref 22–32)
CREATININE: 7.74 mg/dL — AB (ref 0.61–1.24)
Calcium: 9.1 mg/dL (ref 8.9–10.3)
GFR calc Af Amer: 8 mL/min — ABNORMAL LOW (ref >60)
GFR calc non Af Amer: 7 mL/min — ABNORMAL LOW (ref >60)
GLUCOSE: 277 mg/dL — AB (ref 65–99)
Potassium: 3.9 mmol/L (ref 3.5–5.1)
Sodium: 128 mmol/L — ABNORMAL LOW (ref 135–145)

## 2015-07-07 LAB — TYPE AND SCREEN
ABO/RH(D): B POS
ANTIBODY SCREEN: POSITIVE
DAT, IgG: NEGATIVE
UNIT DIVISION: 0
Unit division: 0
Unit division: 0

## 2015-07-07 LAB — PROTIME-INR
INR: 1.3 (ref 0.00–1.49)
Prothrombin Time: 16.4 seconds — ABNORMAL HIGH (ref 11.6–15.2)

## 2015-07-07 LAB — HAPTOGLOBIN: Haptoglobin: 65 mg/dL (ref 34–200)

## 2015-07-07 LAB — EPSTEIN-BARR VIRUS VCA, IGG: EBV VCA IGG: 195 U/mL — AB (ref 0.0–17.9)

## 2015-07-07 LAB — MYCOPLASMA PNEUMONIAE ANTIBODY, IGM

## 2015-07-07 LAB — GLUCOSE, CAPILLARY
GLUCOSE-CAPILLARY: 260 mg/dL — AB (ref 65–99)
GLUCOSE-CAPILLARY: 321 mg/dL — AB (ref 65–99)
GLUCOSE-CAPILLARY: 273 mg/dL — AB (ref 65–99)
GLUCOSE-CAPILLARY: 257 mg/dL — AB (ref 65–99)

## 2015-07-07 LAB — EPSTEIN-BARR VIRUS VCA, IGM

## 2015-07-07 LAB — FIBRINOGEN: FIBRINOGEN: 789 mg/dL — AB (ref 204–475)

## 2015-07-07 LAB — HEMOGLOBIN A1C
HEMOGLOBIN A1C: 8 % — AB (ref 4.8–5.6)
Mean Plasma Glucose: 183 mg/dL

## 2015-07-07 LAB — APTT: aPTT: 43 seconds — ABNORMAL HIGH (ref 24–37)

## 2015-07-07 LAB — D-DIMER, QUANTITATIVE: D-Dimer, Quant: 4.14 ug/mL-FEU — ABNORMAL HIGH (ref 0.00–0.48)

## 2015-07-07 MED ORDER — BENZONATATE 100 MG PO CAPS
100.0000 mg | ORAL_CAPSULE | Freq: Three times a day (TID) | ORAL | Status: DC
  Administered 2015-07-07 – 2015-07-09 (×6): 100 mg via ORAL
  Filled 2015-07-07 (×7): qty 1

## 2015-07-07 MED ORDER — HYDROCODONE-HOMATROPINE 5-1.5 MG/5ML PO SYRP
5.0000 mL | ORAL_SOLUTION | ORAL | Status: DC | PRN
  Administered 2015-07-07: 5 mL via ORAL
  Filled 2015-07-07: qty 5

## 2015-07-07 MED ORDER — DEXTROSE 5 % IV SOLN
2.0000 g | Freq: Four times a day (QID) | INTRAVENOUS | Status: DC
  Administered 2015-07-07 – 2015-07-08 (×3): 2 g via INTRAVENOUS
  Filled 2015-07-07 (×5): qty 2000

## 2015-07-07 MED ORDER — INSULIN GLARGINE 100 UNIT/ML ~~LOC~~ SOLN
10.0000 [IU] | Freq: Every day | SUBCUTANEOUS | Status: DC
  Administered 2015-07-07 – 2015-07-13 (×7): 10 [IU] via SUBCUTANEOUS
  Filled 2015-07-07 (×8): qty 0.1

## 2015-07-07 MED ORDER — CODEINE SULFATE 15 MG PO TABS
15.0000 mg | ORAL_TABLET | Freq: Once | ORAL | Status: AC
  Administered 2015-07-07: 15 mg via ORAL
  Filled 2015-07-07: qty 1

## 2015-07-07 MED ORDER — HYDROCODONE-HOMATROPINE 5-1.5 MG/5ML PO SYRP
5.0000 mL | ORAL_SOLUTION | ORAL | Status: DC
  Administered 2015-07-07 – 2015-07-09 (×11): 5 mL via ORAL
  Filled 2015-07-07 (×11): qty 5

## 2015-07-07 MED ORDER — IPRATROPIUM-ALBUTEROL 0.5-2.5 (3) MG/3ML IN SOLN
3.0000 mL | Freq: Four times a day (QID) | RESPIRATORY_TRACT | Status: DC | PRN
  Administered 2015-07-09: 3 mL via RESPIRATORY_TRACT
  Filled 2015-07-07: qty 3

## 2015-07-07 MED ORDER — HYDROCODONE-HOMATROPINE 5-1.5 MG/5ML PO SYRP: 5.0000 mL | ORAL_SOLUTION | Freq: Four times a day (QID) | ORAL | Status: DC

## 2015-07-07 NOTE — Care Management Important Message (Signed)
Important Message  Patient Details  Name: Seth [name] MRN: [id number] Date of Birth: December 05, 1965   Medicare Important Message Given:  Yes-second notification given    Delorse Lek 07/07/2015, 2:35 PM

## 2015-07-07 NOTE — Progress Notes (Signed)
[facility] ROUNDING NOTE   Subjective:   Interval History: no complaints today , reading bible   Some mild dry cough  Objective:  Vital signs in last 24 hours:  Temp:  [97.4 F (36.3 C)-98.2 F (36.8 C)] 98.2 F (36.8 C) (09/27 0901) Pulse Rate:  [87-106] 91 (09/27 0901) Resp:  [16-19] 19 (09/27 0901) BP: (102-149)/(44-74) 121/59 mmHg (09/27 0901) SpO2:  [93 %-100 %] 95 % (09/27 0901) Weight:  [140.8 kg (310 lb 6.5 oz)] 140.8 kg (310 lb 6.5 oz) (09/26 1121)  Weight change: 6.568 kg (14 lb 7.7 oz) Filed Weights   07/06/15 0624 07/06/15 0704 07/06/15 1121  Weight: 137.032 kg (302 lb 1.6 oz) 143.6 kg (316 lb 9.3 oz) 140.8 kg (310 lb 6.5 oz)    Intake/Output: I/O last 3 completed shifts: In: [id number] [P.O.:600; Blood:335] Out: 3120 [Urine:120; Other:3000]   Intake/Output this shift:  Total I/O In: 240 [P.O.:240] Out: 0   General: alert obese pleasant AA male NAD, [name]  Heart: RRR no mur , rub or gal Lungs: CTA bilat , nonlab breathing  Abdomen: obese , soft NT Extremities: no pedal edema Dialysis Access: R FA AVF pos bruit    Basic Metabolic Panel:  Recent Labs Lab 07/04/15 1010 07/04/15 1800 07/05/15 0453 07/06/15 0709 07/07/15 0846  NA 124* 128* 129* 126* 128*  K 4.7 3.5 4.4 4.0 3.9  CL 82* 87* 87* 86* 89*  CO2 23 23 26 22 25   GLUCOSE 370* 299* 307* 286* 277*  BUN 83* 63* 77* 99* 66*  CREATININE 8.67* 6.76* 8.28* 10.11* 7.74*  CALCIUM 9.3 9.1 9.3 9.2 9.1  PHOS  --   --   --  7.9*  --     Liver Function Tests:  Recent Labs Lab 07/04/15 1341 07/05/15 0453 07/06/15 0709 07/06/15 1235  AST 19 26  --  21  ALT 19 22  --  20  ALKPHOS 105 97  --  98  BILITOT 1.4* 1.3*  --  1.6*  PROT 10.1* 9.2*  --  9.0*  ALBUMIN 3.2* 3.2* 3.3* 3.3*   No results for input(s): LIPASE, AMYLASE in the last 168 hours. No results for input(s): AMMONIA in the last 168 hours.  CBC:  Recent Labs Lab 07/03/15 1455 07/04/15 0512 07/04/15 1010  07/05/15 0453 07/05/15 1416 07/06/15 0336 07/07/15 0846  WBC 18.4* 28.4* 30.1* 22.1* 20.6* 16.6* 17.3*  NEUTROABS 14.4* 24.4*  --  16.7*  --  11.6* 12.9*  HGB 7.5* 6.8* 6.4* 7.3* 7.0* 6.5* 7.4*  HCT 21.2* 18.0* 17.7* 21.4* 20.3* 19.2* 21.9*  MCV 98.6 100.0 94.1 88.4 86.8 88.9 89.8  PLT 501* PLATELET CLUMPS NOTED ON SMEAR, COUNT APPEARS INCREASED 530* 490* 520* 471* 454*    Cardiac Enzymes: No results for input(s): CKTOTAL, CKMB, CKMBINDEX, TROPONINI in the last 168 hours.  BNP: Invalid input(s): POCBNP  CBG:  Recent Labs Lab 07/05/15 2007 07/06/15 1150 07/06/15 1621 07/06/15 2050 07/07/15 0753  GLUCAP 283* 258* 297* 30* 260*    Microbiology: Results for orders placed or performed during the hospital encounter of 07/03/15  MRSA PCR Screening     Status: None   Collection Time: 07/03/15  7:59 PM  Result Value Ref Range Status   MRSA by PCR NEGATIVE NEGATIVE Final    Comment:        The GeneXpert MRSA Assay (FDA approved for NASAL specimens only), is one component of a comprehensive MRSA colonization surveillance program. It is not intended to diagnose MRSA  infection nor to guide or monitor treatment for MRSA infections.   Culture, blood (routine x 2)     Status: None   Collection Time: 07/04/15  1:30 PM  Result Value Ref Range Status   Specimen Description BLOOD DIALYSIS  Final   Special Requests BOTTLES DRAWN AEROBIC AND ANAEROBIC 10CC  Final   Culture  Setup Time   Final    GRAM POSITIVE COCCI IN CLUSTERS AEROBIC BOTTLE ONLY CRITICAL RESULT CALLED TO, READ BACK BY AND VERIFIED WITH: A MINTZ 07/05/15 @ [place]  Final   Report Status 07/07/2015 FINAL  Final   Organism ID, Bacteria STAPHYLOCOCCUS LUGDUNENSIS  Final      Susceptibility   Staphylococcus lugdunensis - MIC*    CIPROFLOXACIN <=0.5 SENSITIVE Sensitive     ERYTHROMYCIN <=0.25 SENSITIVE Sensitive     GENTAMICIN <=0.5 SENSITIVE Sensitive     OXACILLIN  0.5 SENSITIVE Sensitive     TETRACYCLINE <=1 SENSITIVE Sensitive     VANCOMYCIN <=0.5 SENSITIVE Sensitive     TRIMETH/SULFA <=10 SENSITIVE Sensitive     CLINDAMYCIN <=0.25 SENSITIVE Sensitive     RIFAMPIN <=0.5 SENSITIVE Sensitive     Inducible Clindamycin NEGATIVE Sensitive     * STAPHYLOCOCCUS LUGDUNENSIS  Culture, blood (routine x 2)     Status: None   Collection Time: 07/04/15  1:45 PM  Result Value Ref Range Status   Specimen Description BLOOD DIALYSIS  Final   Special Requests BOTTLES DRAWN AEROBIC AND ANAEROBIC 10CC  Final   Culture  Setup Time   Final    GRAM POSITIVE COCCI IN CLUSTERS AEROBIC BOTTLE ONLY CONFIRMED BY V WILKINS CRITICAL RESULT CALLED TO, READ BACK BY AND VERIFIED WITH: K HAYWOOD 07/06/15 @1114  M VESTAL    Culture   Final    STAPHYLOCOCCUS LUGDUNENSIS SUSCEPTIBILITIES PERFORMED ON PREVIOUS CULTURE WITHIN THE LAST 5 DAYS.    Report Status 07/07/2015 FINAL  Final    Coagulation Studies:  Recent Labs  07/07/15 0846  LABPROT 16.4*  INR 1.30    Urinalysis: No results for input(s): COLORURINE, LABSPEC, PHURINE, GLUCOSEU, HGBUR, BILIRUBINUR, KETONESUR, PROTEINUR, UROBILINOGEN, NITRITE, LEUKOCYTESUR in the last 72 hours.  Invalid input(s): APPERANCEUR    Imaging: No results found.   Medications:     . sodium chloride   Intravenous Once  . sodium chloride   Intravenous Once  . sodium chloride   Intravenous Once  . amLODipine  10 mg Oral Daily  . aspirin EC  81 mg Oral Daily  . calcium acetate  1,334 mg Oral TID WC  . darbepoetin (ARANESP) injection - DIALYSIS  150 mcg Intravenous Q Mon-HD  . doxercalciferol  2.5 mcg Intravenous Q M,W,F-HD  . enoxaparin (LOVENOX) injection  30 mg Subcutaneous Q24H  . HYDROcodone-homatropine  5 mL Oral Q6H  . insulin aspart  0-5 Units Subcutaneous QHS  . insulin aspart  0-9 Units Subcutaneous TID WC  . insulin aspart  4 Units Subcutaneous TID WC  . ipratropium-albuterol  3 mL Nebulization Q6H  . multivitamin   1 tablet Oral QHS  . pneumococcal 23 valent vaccine  0.5 mL Intramuscular Tomorrow-1000  . vancomycin  1,000 mg Intravenous Q M,W,F-HD     Assessment/ Plan:  1. Anemia- Admit team wu/ heme  2. ESRD -MWF HD schedule ( [facility]) 3. HTN/volume - controlled 4. Secondary hyperparathyroidism - Phoslo and Hectorol iv on hd 5. Cough / "bronchitis - admit team RX 6. DM - per admit  7. Obesity with OSA - use cpap/ trying to lose wt taper edw as bp allows   LOS: 4 WEBB,[place] W @TODAY @11 :12 AM

## 2015-07-07 NOTE — Progress Notes (Signed)
Date: 07/07/2015  Patient name: Seth [name]  Medical record number: [id number]  Date of birth: 1966-01-02    Date of Admission:  07/03/2015   Total days of antibiotics 2        Day 2 vancomycin           ID: Seth [name] is a 49 y.o. male with ESRD on HD, well controlled DM admitted for malaise flu like illness found to have leukocytosis of 18K, new onset anemia. Initially treated for asthma with steroids.  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 Denies fever.    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 sodium chloride   Intravenous Once  . sodium chloride   Intravenous Once  . sodium chloride   Intravenous Once  . amLODipine  10 mg Oral Daily  . aspirin EC  81 mg Oral Daily  . benzonatate  100 mg Oral TID  . calcium acetate  1,334 mg Oral TID WC  . darbepoetin (ARANESP) injection - DIALYSIS  150 mcg Intravenous Q Mon-HD  . doxercalciferol  2.5 mcg Intravenous Q M,W,F-HD  . enoxaparin (LOVENOX) injection  30 mg Subcutaneous Q24H  . HYDROcodone-homatropine  5 mL Oral Q4H  . insulin aspart  0-5 Units Subcutaneous QHS  . insulin aspart  0-9 Units Subcutaneous TID WC  . insulin aspart  4 Units Subcutaneous TID WC  . insulin glargine  10 Units Subcutaneous QHS  . ipratropium-albuterol  3 mL Nebulization Q6H  . multivitamin  1 tablet Oral QHS  . nafcillin IV  2 g Intravenous 4 times per day  . pneumococcal 23 valent vaccine  0.5 mL Intramuscular Tomorrow-1000    Objective: Vital signs in last 24 hours: Temp:  [97.4 F (36.3 C)-98.2 F (36.8 C)] 97.4 F (36.3 C) (09/27  1715) Pulse Rate:  [87-95] 90 (09/27 1715) Resp:  [16-19] 18 (09/27 1715) BP: (102-149)/(44-77) 135/77 mmHg (09/27 1715) SpO2:  [95 %-100 %] 100 % (09/27 1715)  Physical Exam  Constitutional: He is oriented to person, place, and time. He appears well-developed and well-nourished. No distress. Sitting in chair in room HENT:  Mouth/Throat: Oropharynx is clear and moist. No oropharyngeal exudate.  Cardiovascular: Normal rate, regular rhythm and normal heart sounds. Exam reveals no gallop and no friction rub.  No murmur heard.  Pulmonary/Chest: Effort normal and breath sounds normal. No respiratory distress. He has no wheezes.  Abdominal: Soft. Bowel sounds are normal. He exhibits no distension. There is no tenderness.  Lymphadenopathy:  He has no cervical adenopathy.  Neurological: He is alert and oriented to person, place, and time.  Skin: Skin is warm and dry. No rash noted. No erythema.  Psychiatric: He has a normal mood and affect. His behavior is normal.     Lab Results  Recent Labs  07/06/15 0336 07/06/15 0709 07/07/15 0846  WBC 16.6*  --  17.3*  HGB 6.5*  --  7.4*  HCT 19.2*  --  21.9*  NA  --  126* 128*  K  --  4.0 3.9  CL  --  86*  89*  CO2  --  22 25  BUN  --  99* 66*  CREATININE  --  10.11* 7.74*   Liver Panel  Recent Labs  07/05/15 0453 07/06/15 0709 07/06/15 1235  PROT 9.2*  --  9.0*  ALBUMIN 3.2* 3.3* 3.3*  AST 26  --  21  ALT 22  --  20  ALKPHOS 97  --  98  BILITOT 1.3*  --  1.6*  BILIDIR  --   --  0.4  IBILI  --   --  1.2*    Microbiology: 9/23 blood cx x2 staph ludgenes.  Studies/Results: Dg Orthopantogram  07/07/2015   CLINICAL DATA:  Decaying teeth.  Cardiac procedure tomorrow.  EXAM: ORTHOPANTOGRAM/PANORAMIC  COMPARISON:  CT 12/17/2005  FINDINGS: Mandible demonstrates no evidence of fracture. There is degenerative change of the left temporomandibular joint. There is poor dentition with multiple dental caries as well as multiple missing  upper and lower teeth. There is a 1.1 cm oval calcification just below the angle of the right mandible known to be within the superior aspect of the right mandibular gland on previous CT scan.  IMPRESSION: Poor dentition with multiple dental caries and multiple missing upper and lower teeth.  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  1.1 cm calcification over the superior aspect of the right mandibular gland.   Electronically Signed   By: Marin Olp M.D.   On: 07/07/2015 17:09     Assessment/Plan: Staph LUGDUNENSIS bacteremia = will narrow antibiotics to nafcillin. will repeat blood culture to document clearance. Will check panorex due to poor dentition. Will need TEE to rule out endocarditis. Likely cause of why he felt so poorly +/- 2nd infection for viral URI  Anemia = poor response to blood transfusion thought to be inpart to cold agglutinin. Still having some work up. Possibly related to recent URI. Mycoplasma and EBV pending. appreciate hematology recs. Getting aranesp injection Q2 wk now since hgb <10.  ESRD on HD = continue with hemodialysis m-w-f, appreciate renal recs  Cough from URI = continue with supportive care, will change cough medication to hycodin vs. Giving oxycodone or vicodin   Baxter Flattery [facility] Cell: [phone] Pager: [phone]  07/07/2015, 5:43 PM      This patient's plan of care was discussed with the house staff. Please see their note for complete details. I concur with their findings.   Carlyle Basques, MD 07/07/2015, 5:43 PM

## 2015-07-07 NOTE — Progress Notes (Signed)
Inpatient Diabetes Program Recommendations  AACE/ADA: New Consensus Statement on Inpatient Glycemic Control (2015)  Target Ranges:  Prepandial:   less than 140 mg/dL      Peak postprandial:   less than 180 mg/dL (1-2 hours)      Critically ill patients:  140 - 180 mg/dL   Review of Glycemic Control  Results for Seth [name], Seth [name] (MRN [id number]) as of 07/07/2015 15:20  Ref. Range 07/06/2015 11:50 07/06/2015 16:21 07/06/2015 20:50 07/07/2015 07:53 07/07/2015 11:19  Glucose-Capillary Latest Ref Range: 65-99 mg/dL 258 (H) 297 (H) 249 (H) 260 (H) 321 (H)  Needs insulin adjustment.  Recommendations: Increase Lantus to 30 units QHS.  Thank you. Lorenda Peck, RD, LDN, CDE Inpatient Diabetes Coordinator [phone]

## 2015-07-07 NOTE — Progress Notes (Signed)
    [facility] has been requested to perform a transesophageal echocardiogram on 07/07/2015 for bacteremia.  After careful review of history and examination, the risks and benefits of transesophageal echocardiogram have been explained including risks of esophageal damage, perforation (1:10,000 risk), bleeding, pharyngeal hematoma as well as other potential complications associated with conscious sedation including aspiration, arrhythmia, respiratory failure and death. Alternatives to treatment were discussed, questions were answered. Patient is willing to proceed.   49 yo male with PMH of ESRD, chronic anemia 2/2 ESRD, HTN and DM presented with SOB and lightheadedness. Lab shows severe leukocytosis, however no clear source. WBC peaked at 30. Blood culture gree GPC in clusters --> staph lugdunensis. TTE done shows normal EF. Cardiology consulted for TEE. Noted, he had hemoglobin of 6.5, received 1 units PRBC, today is 7.4. Platelet 454. Interestingly, patient denies any blood in the stool or urine. He denies any bleeding. Hematology consulted. His SBP 100-140s. Despite only 310 lbs, he appears very obese.  Discussed with card master, will ask Dr. Meda Coffee to review hemoglobin in AM before his procedure. CBC ordered to tomorrow. Also he may need general anesthesia vs sedation, will let Dr. Meda Coffee decide.    Almyra Deforest, PA-C 07/07/2015 2:28 PM

## 2015-07-07 NOTE — Progress Notes (Addendum)
Subjective: Patient doing okay this morning, no acute events overnight.  Received 1 unit of PRBCs overnight.  Only complaint this morning is his dry, non-productive cough that is worse at night.  Otherwise, does not bother him too much. States he is having some ribcage and back pain associated with cough.  No complaint of chest pain, shortness of breath, abdominal pain, n/v/d, urinary symptoms.  Objective: Vital signs in last 24 hours: Filed Vitals:   07/06/15 2249 07/07/15 0159 07/07/15 0519 07/07/15 0901  BP: 102/44 102/48 149/66 121/59  Pulse: 95 92 87 91  Temp: 98.2 F (36.8 C) 97.6 F (36.4 C) 97.8 F (36.6 C) 98.2 F (36.8 C)  TempSrc: Axillary Oral Oral Oral  Resp: 16 16 19 19   Height:      Weight:      SpO2: 99% 99% 97% 95%   Weight change: 14 lb 7.7 oz (6.568 kg)  Intake/Output Summary (Last 24 hours) at 07/07/15 1313 Last data filed at 07/07/15 1119  Gross per 24 hour  Intake    815 ml  Output      0 ml  Net    815 ml   General: sitting up in chair, watching TV, wearing CPAP, no distress HEENT: EOMI, no scleral icterus Cardiac: RRR, no rubs, murmurs or gallops Pulm: CTAB, no respiratory distress, moving normal volumes of air Abd: soft, nontender, nondistended, BS present Ext: warm and well perfused, no pedal edema Neuro: alert and oriented X3, cranial nerves II-XII grossly intact  Lab Results: Basic Metabolic Panel:7  Recent Labs Lab 07/06/15 0709 07/07/15 0846  NA 126* 128*  K 4.0 3.9  CL 86* 89*  CO2 22 25  GLUCOSE 286* 277*  BUN 99* 66*  CREATININE 10.11* 7.74*  CALCIUM 9.2 9.1  PHOS 7.9*  --    Liver Function Tests:  Recent Labs Lab 07/05/15 0453 07/06/15 0709 07/06/15 1235  AST 26  --  21  ALT 22  --  20  ALKPHOS 97  --  98  BILITOT 1.3*  --  1.6*  PROT 9.2*  --  9.0*  ALBUMIN 3.2* 3.3* 3.3*   No results for input(s): LIPASE, AMYLASE in the last 168 hours. No results for input(s): AMMONIA in the last 168 hours. CBC:  Recent  Labs Lab 07/06/15 0336 07/07/15 0846  WBC 16.6* 17.3*  NEUTROABS 11.6* 12.9*  HGB 6.5* 7.4*  HCT 19.2* 21.9*  MCV 88.9 89.8  PLT 471* 454*   Cardiac Enzymes: No results for input(s): CKTOTAL, CKMB, CKMBINDEX, TROPONINI in the last 168 hours. BNP: No results for input(s): PROBNP in the last 168 hours. D-Dimer:  Recent Labs Lab 07/07/15 0846  DDIMER 4.14*   CBG:  Recent Labs Lab 07/05/15 2007 07/06/15 1150 07/06/15 1621 07/06/15 2050 07/07/15 0753 07/07/15 1119  GLUCAP 283* 258* 297* 249* 260* 321*   Hemoglobin A1C:  Recent Labs Lab 07/03/15 1455  HGBA1C 8.0*   Fasting Lipid Panel: No results for input(s): CHOL, HDL, LDLCALC, TRIG, CHOLHDL, LDLDIRECT in the last 168 hours. Thyroid Function Tests: No results for input(s): TSH, T4TOTAL, FREET4, T3FREE, THYROIDAB in the last 168 hours. Coagulation:  Recent Labs Lab 07/07/15 0846  LABPROT 16.4*  INR 1.30   Anemia Panel:  Recent Labs Lab 07/04/15 1800 07/06/15 1130  TIBC 221*  --   IRON 193*  --   RETICCTPCT  --  5.3*   Urine Drug Screen: Drugs of Abuse  No results found for: LABOPIA, [place], [place], AMPHETMU, THCU, LABBARB  Alcohol Level: No results for input(s): ETH in the last 168 hours. Urinalysis: No results for input(s): COLORURINE, LABSPEC, PHURINE, GLUCOSEU, HGBUR, BILIRUBINUR, KETONESUR, PROTEINUR, UROBILINOGEN, NITRITE, LEUKOCYTESUR in the last 168 hours.  Invalid input(s): APPERANCEUR  Micro Results: Recent Results (from the past 240 hour(s))  MRSA PCR Screening     Status: None   Collection Time: 07/03/15  7:59 PM  Result Value Ref Range Status   MRSA by PCR NEGATIVE NEGATIVE Final    Comment:        The GeneXpert MRSA Assay (FDA approved for NASAL specimens only), is one component of a comprehensive MRSA colonization surveillance program. It is not intended to diagnose MRSA infection nor to guide or monitor treatment for MRSA infections.   Culture, blood (routine x  2)     Status: None   Collection Time: 07/04/15  1:30 PM  Result Value Ref Range Status   Specimen Description BLOOD DIALYSIS  Final   Special Requests BOTTLES DRAWN AEROBIC AND ANAEROBIC 10CC  Final   Culture  Setup Time   Final    GRAM POSITIVE COCCI IN CLUSTERS AEROBIC BOTTLE ONLY CRITICAL RESULT CALLED TO, READ BACK BY AND VERIFIED WITH: A MINTZ 07/05/15 @ [place]  Final   Report Status 07/07/2015 FINAL  Final   Organism ID, Bacteria STAPHYLOCOCCUS LUGDUNENSIS  Final      Susceptibility   Staphylococcus lugdunensis - MIC*    CIPROFLOXACIN <=0.5 SENSITIVE Sensitive     ERYTHROMYCIN <=0.25 SENSITIVE Sensitive     GENTAMICIN <=0.5 SENSITIVE Sensitive     OXACILLIN 0.5 SENSITIVE Sensitive     TETRACYCLINE <=1 SENSITIVE Sensitive     VANCOMYCIN <=0.5 SENSITIVE Sensitive     TRIMETH/SULFA <=10 SENSITIVE Sensitive     CLINDAMYCIN <=0.25 SENSITIVE Sensitive     RIFAMPIN <=0.5 SENSITIVE Sensitive     Inducible Clindamycin NEGATIVE Sensitive     * STAPHYLOCOCCUS LUGDUNENSIS  Culture, blood (routine x 2)     Status: None   Collection Time: 07/04/15  1:45 PM  Result Value Ref Range Status   Specimen Description BLOOD DIALYSIS  Final   Special Requests BOTTLES DRAWN AEROBIC AND ANAEROBIC 10CC  Final   Culture  Setup Time   Final    GRAM POSITIVE COCCI IN CLUSTERS AEROBIC BOTTLE ONLY CONFIRMED BY V WILKINS CRITICAL RESULT CALLED TO, READ BACK BY AND VERIFIED WITH: K HAYWOOD 07/06/15 @1114  M VESTAL    Culture   Final    STAPHYLOCOCCUS LUGDUNENSIS SUSCEPTIBILITIES PERFORMED ON PREVIOUS CULTURE WITHIN THE LAST 5 DAYS.    Report Status 07/07/2015 FINAL  Final   Studies/Results: No results found. Medications:  Scheduled Meds: . sodium chloride   Intravenous Once  . sodium chloride   Intravenous Once  . sodium chloride   Intravenous Once  . amLODipine  10 mg Oral Daily  . aspirin EC  81 mg Oral Daily  . calcium acetate  1,334 mg Oral TID  WC  . darbepoetin (ARANESP) injection - DIALYSIS  150 mcg Intravenous Q Mon-HD  . doxercalciferol  2.5 mcg Intravenous Q M,W,F-HD  . enoxaparin (LOVENOX) injection  30 mg Subcutaneous Q24H  . HYDROcodone-homatropine  5 mL Oral Q6H  . insulin aspart  0-5 Units Subcutaneous QHS  . insulin aspart  0-9 Units Subcutaneous TID WC  . insulin aspart  4 Units Subcutaneous TID WC  . ipratropium-albuterol  3 mL Nebulization Q6H  . multivitamin  1 tablet Oral QHS  .  pneumococcal 23 valent vaccine  0.5 mL Intramuscular Tomorrow-1000  . vancomycin  1,000 mg Intravenous Q M,W,F-HD   Continuous Infusions:  PRN Meds:. Assessment/Plan: Active Problems:   DM II (diabetes mellitus, type II), controlled   Essential hypertension   End stage renal disease on dialysis   Normocytic anemia   OSA (obstructive sleep apnea)   Shortness of breath   Absolute anemia   Cold agglutinin disease   Bronchitis  49 y.o. AA [name] with past medical history of DM type II, HTN, ESRD with MWF HD, and OSA who presents to the ED with 2.5 weeks of feeling bad with what he describes as cold like symptoms, runny nose.  Leukocytosis & GPC Bacteremia - unclear source -WBC at admission of 18.4 with 78% PMNs.  Received prednisone 60mg  in the ED.  -WBC trend: 18.4 >> 30.1 >> 22.1 >> 20.6 >> 16.6 >> 17.3 w/ 75% PMNs today -Tmax last 24 hours 98.2, vitals stable, no obvious sign of infection -Blood cultures 2/2 positive for GPC in clusters, aerobic bottles >> Staph Lugdunensis (pan sensitive) -Vancomycin per pharmacy (received 2 days of Vanc) >> Nafcillin 2g IV q6h -Trans thoracic echo without evidence of endocarditis as source of bacteremia -Obtain orthopantogram  -Consult Cardiology for possible TEE -Repeat blood cultures x 2   Acute worsening on Chronic Anemia Has baseline anemia from CKD, hgb last outpatient HD 10.3 on 9/12. On EPO outpatient HD. -Hgb 7.5 on admission, down from 11.3 on 9/14.  Subsequent CBCs with Hgb 6.8 >>  6.4 >> s/p 2 units up to 7.3 >> 7.0 >> 6.5 >> s/p 1 unit up to 7.4 -MCV Seth.1. Iron 193, LDH 257>>321, Reticulocyte % 4.1 >> 5.3, Smear has no schistocytes, haptoglobin 430>>65. FOBT neg.  -T-bili 1.3 >> 1.6 -Consult to Hematology, appreciate their recommendations -unlikely a delayed transfusion reaction -EBV IgM and IgG, Mycoplasma IgM pending -DIC Panel: PT/INR 16.4/1.3, APTT 43, Fibrinogen 789, D-dimer 4.14 -Cold agglutinin titers pending -use blood warmer for subsequent transfusion to prevent cold-agglutinins -monitor CBC closely.   Cough  -Recently finished Azithromycin x5 days outpatient. -chest x-ray on admission compared to 9/14 shows improved right bibasilar atelectasis -strep pneumo negative, legionella pending -mycoplasma pending -Hycodan q4h -Tessalon 100mg  TID -continue Duonebs q4h   Diabetes mellitus -sliding scale insulin -Lantus 10 units qhs -Hbg A1c pending  ESRD on M,W,F HD at [place] yesterday. Appreciate nephrology recs. -Given Aranesp yesterday  Hypertension -Continue amlodipine.   Hyponatremia  -likely due to hyperglycemia combined with possible increased volume from renal disease -continue to monitor  Hyperkalemia - improved with kayexalate + HD -Potassium was found to be 6.6 initially. Improved with kayexalate and also HD.  -Potassium 3.9 today  OSA -CPAP  Dispo: Disposition is deferred at this time, awaiting improvement of current medical problems.    The patient does have a current PCP (Tresa Garter, MD) and does not need an [facility] hospital follow-up appointment after discharge.  The patient does not have transportation limitations that hinder transportation to clinic appointments.    LOS: 4 days   Jule Ser, DO 07/07/2015, 1:13 PM

## 2015-07-07 NOTE — Procedures (Signed)
Pt on CPAP resting comfortably and tolerating well.

## 2015-07-08 ENCOUNTER — Encounter (HOSPITAL_COMMUNITY)
Admission: EM | Disposition: A | Discharge status: Home Health | Payer: Self-pay | Source: Home / Self Care | Attending: Internal Medicine

## 2015-07-08 ENCOUNTER — Other Ambulatory Visit (HOSPITAL_COMMUNITY): Payer: Medicare Other

## 2015-07-08 LAB — RENAL FUNCTION PANEL
ALBUMIN: 3.3 g/dL — AB (ref 3.5–5.0)
Anion gap: 18 — ABNORMAL HIGH (ref 5–15)
BUN: 88 mg/dL — AB (ref 6–20)
CO2: 20 mmol/L — ABNORMAL LOW (ref 22–32)
Calcium: 8.7 mg/dL — ABNORMAL LOW (ref 8.9–10.3)
Chloride: 87 mmol/L — ABNORMAL LOW (ref 101–111)
Creatinine, Ser: 9.84 mg/dL — ABNORMAL HIGH (ref 0.61–1.24)
GFR calc Af Amer: 6 mL/min — ABNORMAL LOW (ref >60)
GFR, EST NON AFRICAN AMERICAN: 5 mL/min — AB (ref >60)
Glucose, Bld: 289 mg/dL — ABNORMAL HIGH (ref 65–99)
PHOSPHORUS: 7.2 mg/dL — AB (ref 2.5–4.6)
POTASSIUM: 3.9 mmol/L (ref 3.5–5.1)
Sodium: 125 mmol/L — ABNORMAL LOW (ref 135–145)

## 2015-07-08 LAB — CBC WITH DIFFERENTIAL/PLATELET
BASOS ABS: 0.1 10*3/uL (ref 0.0–0.1)
BASOS PCT: 1 %
Eosinophils Absolute: 0.4 10*3/uL (ref 0.0–0.7)
Eosinophils Relative: 3 %
HEMATOCRIT: 19.2 % — AB (ref 39.0–52.0)
HEMOGLOBIN: 6.5 g/dL — AB (ref 13.0–17.0)
Lymphocytes Relative: 18 %
Lymphs Abs: 2.8 10*3/uL (ref 0.7–4.0)
MCH: 30.7 pg (ref 26.0–34.0)
MCHC: 33.9 g/dL (ref 30.0–36.0)
MCV: 90.6 fL (ref 78.0–100.0)
Monocytes Absolute: 0.8 10*3/uL (ref 0.1–1.0)
Monocytes Relative: 5 %
NEUTROS ABS: 11.5 10*3/uL — AB (ref 1.7–7.7)
NEUTROS PCT: 74 %
Platelets: 439 10*3/uL — ABNORMAL HIGH (ref 150–400)
RBC: 2.12 MIL/uL — ABNORMAL LOW (ref 4.22–5.81)
RDW: 15.3 % (ref 11.5–15.5)
WBC: 15.5 10*3/uL — ABNORMAL HIGH (ref 4.0–10.5)

## 2015-07-08 LAB — GLUCOSE, CAPILLARY
GLUCOSE-CAPILLARY: 300 mg/dL — AB (ref 65–99)
GLUCOSE-CAPILLARY: 325 mg/dL — AB (ref 65–99)
GLUCOSE-CAPILLARY: 228 mg/dL — AB (ref 65–99)
Glucose-Capillary: 206 mg/dL — ABNORMAL HIGH (ref 65–99)

## 2015-07-08 LAB — BASIC METABOLIC PANEL
ANION GAP: 16 — AB (ref 5–15)
BUN: 87 mg/dL — ABNORMAL HIGH (ref 6–20)
CALCIUM: 8.7 mg/dL — AB (ref 8.9–10.3)
CHLORIDE: 88 mmol/L — AB (ref 101–111)
CO2: 21 mmol/L — AB (ref 22–32)
Creatinine, Ser: 9.91 mg/dL — ABNORMAL HIGH (ref 0.61–1.24)
GFR calc non Af Amer: 5 mL/min — ABNORMAL LOW (ref >60)
GFR, EST AFRICAN AMERICAN: 6 mL/min — AB (ref >60)
Glucose, Bld: 293 mg/dL — ABNORMAL HIGH (ref 65–99)
Potassium: 3.9 mmol/L (ref 3.5–5.1)
Sodium: 125 mmol/L — ABNORMAL LOW (ref 135–145)

## 2015-07-08 LAB — COLD AGGLUTININ TITER: Cold Agglutinin Titer: 1:2048 {titer}

## 2015-07-08 LAB — LEGIONELLA PNEUMOPHILA SEROGP 1 UR AG: L. pneumophila Serogp 1 Ur Ag: NEGATIVE

## 2015-07-08 LAB — PREPARE RBC (CROSSMATCH)

## 2015-07-08 SURGERY — ECHOCARDIOGRAM, TRANSESOPHAGEAL
Anesthesia: Monitor Anesthesia Care

## 2015-07-08 MED ORDER — HEPARIN SODIUM (PORCINE) 1000 UNIT/ML DIALYSIS: 1000.0000 [IU] | INTRAMUSCULAR | Status: DC | PRN

## 2015-07-08 MED ORDER — CEFAZOLIN SODIUM-DEXTROSE 2-3 GM-% IV SOLR
2.0000 g | INTRAVENOUS | Status: DC
  Administered 2015-07-08 – 2015-07-13 (×2): 2 g via INTRAVENOUS
  Filled 2015-07-08 (×3): qty 50

## 2015-07-08 MED ORDER — SODIUM CHLORIDE 0.9 % IV SOLN: 100.0000 mL | INTRAVENOUS | Status: DC | PRN

## 2015-07-08 MED ORDER — PENTAFLUOROPROP-TETRAFLUOROETH EX AERO
1.0000 | INHALATION_SPRAY | CUTANEOUS | Status: DC | PRN
Start: 2015-07-08 — End: 2015-07-14

## 2015-07-08 MED ORDER — NAFCILLIN SODIUM 2 G IJ SOLR
2.0000 g | Freq: Four times a day (QID) | INTRAVENOUS | Status: DC
  Administered 2015-07-08 (×2): 2 g via INTRAVENOUS
  Filled 2015-07-08 (×4): qty 2000

## 2015-07-08 MED ORDER — LIDOCAINE-PRILOCAINE 2.5-2.5 % EX CREA
1.0000 | TOPICAL_CREAM | CUTANEOUS | Status: DC | PRN
Start: 2015-07-08 — End: 2015-07-14

## 2015-07-08 MED ORDER — DOXERCALCIFEROL 4 MCG/2ML IV SOLN
INTRAVENOUS | Status: AC
  Filled 2015-07-08: qty 2

## 2015-07-08 MED ORDER — ALTEPLASE 2 MG IJ SOLR
2.0000 mg | Freq: Once | INTRAMUSCULAR | Status: DC | PRN
  Filled 2015-07-08: qty 2

## 2015-07-08 MED ORDER — LIDOCAINE HCL (PF) 1 % IJ SOLN: 5.0000 mL | INTRAMUSCULAR | Status: DC | PRN

## 2015-07-08 MED ORDER — SENNOSIDES-DOCUSATE SODIUM 8.6-50 MG PO TABS
2.0000 | ORAL_TABLET | Freq: Every day | ORAL | Status: DC
  Administered 2015-07-08 – 2015-07-13 (×6): 2 via ORAL
  Filled 2015-07-08 (×6): qty 2

## 2015-07-08 NOTE — Progress Notes (Addendum)
Date: 07/08/2015  Patient name: Seth [name]  Medical record number: [id number]  Date of birth: [birth year]    Date of Admission:  07/03/2015   Total days of antibiotics 4        Day 2 nafcillin           ID: ZAHIER LOJEWSKI is a 49 y.o. male with ESRD on HD, well controlled DM admitted for malaise flu like illness found to have leukocytosis of 18K, new onset anemia. Initially treated for asthma with steroids.  insufficient response to blood transfusion, and also found to have staph ludgenescens  Active Problems:   DM II (diabetes mellitus, type II), controlled   Essential hypertension   End stage renal disease on dialysis   Normocytic anemia   OSA (obstructive sleep apnea)   Shortness of breath   Absolute anemia   Cold agglutinin disease   Bronchitis    Subjective: Cough is improving. Denies fever.    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 sodium chloride   Intravenous Once  . sodium chloride   Intravenous Once  . sodium chloride   Intravenous Once  . amLODipine  10 mg Oral Daily  . aspirin EC  81 mg Oral Daily  . benzonatate  100 mg Oral TID  . calcium acetate  1,334 mg Oral TID WC  . darbepoetin (ARANESP) injection - DIALYSIS  150 mcg Intravenous Q Mon-HD  . doxercalciferol  2.5 mcg Intravenous Q M,W,F-HD  . enoxaparin (LOVENOX) injection  30 mg Subcutaneous Q24H  . HYDROcodone-homatropine  5 mL Oral Q4H  . insulin aspart  0-5 Units Subcutaneous QHS  . insulin aspart  0-9 Units Subcutaneous TID WC  . insulin aspart  4 Units Subcutaneous TID WC  . insulin glargine  10 Units Subcutaneous QHS  . multivitamin  1 tablet Oral QHS  . nafcillin IV  2 g Intravenous 4 times per day  . pneumococcal 23 valent vaccine  0.5 mL Intramuscular Tomorrow-1000  . senna-docusate  2 tablet Oral QHS    Objective: Vital signs in last 24 hours: Temp:  [97.1 F (36.2 C)-98.5 F (36.9 C)] 97.6 F (36.4 C) (09/28 1645) Pulse Rate:  [85-105] 91  (09/28 1645) Resp:  [16-23] 18 (09/28 1645) BP: (103-146)/(58-91) 124/68 mmHg (09/28 1645) SpO2:  [98 %-100 %] 98 % (09/28 1645) Weight:  [312 lb 6.3 oz (141.7 kg)-319 lb 10.7 oz (145 kg)] 312 lb 6.3 oz (141.7 kg) (09/28 1043)  Physical Exam  Constitutional: He is oriented to person, place, and time. He appears well-developed and well-nourished. No distress. Sitting in chair for hemodialysis HENT:  Mouth/Throat: Oropharynx is clear and moist. No oropharyngeal exudate.  Cardiovascular: Normal rate, regular rhythm and normal heart sounds. Exam reveals no gallop and no friction rub.  No murmur heard.  Pulmonary/Chest: Effort normal and breath sounds normal. No respiratory distress. He has no wheezes.  Neurological: He is alert and oriented to person, place, and time.  Skin: Skin is warm and dry. No rash noted. No erythema.  Psychiatric: He has a normal mood and affect. His behavior is normal.     Lab Results  Recent Labs  07/07/15 0846 07/08/15 0720 07/08/15 0837  WBC 17.3* 15.5*  --   HGB 7.4* 6.5*  --   HCT 21.9* 19.2*  --   NA 128* 125* 125*  K 3.9 3.9 3.9  CL 89* 88* 87*  CO2 25 21* 20*  BUN 66* 87* 88*  CREATININE 7.74* 9.91* 9.84*   Liver Panel  Recent Labs  07/06/15 1235 07/08/15 0837  PROT 9.0*  --   ALBUMIN 3.3* 3.3*  AST 21  --   ALT 20  --   ALKPHOS 98  --   BILITOT 1.6*  --   BILIDIR 0.4  --   IBILI 1.2*  --     Microbiology: 9/24 blood cx x2 staph ludgenes. 9/27 blood cx pending Studies/Results: Dg Orthopantogram  07/07/2015   CLINICAL DATA:  Decaying teeth.  Cardiac procedure tomorrow.  EXAM: ORTHOPANTOGRAM/PANORAMIC  COMPARISON:  CT 12/17/2005  FINDINGS: Mandible demonstrates no evidence of fracture. There is degenerative change of the left temporomandibular joint. There is poor dentition with multiple dental caries as well as multiple missing upper and lower teeth. There is a 1.1 cm oval calcification just below the angle of the right mandible  known to be within the superior aspect of the right mandibular gland on previous CT scan.  IMPRESSION: Poor dentition with multiple dental caries and multiple missing upper and lower teeth.  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  1.1 cm calcification over the superior aspect of the right mandibular gland.   Electronically Signed   By: Marin Olp M.D.   On: 07/07/2015 17:09     Assessment/Plan: Staph LUGDUNENSIS bacteremia = will change antibiotics to cefazolin to minimize any other addn hemolytic anemia associated with nafcillin. Repeat blood pending. Will check panorex due to poor dentition. awaiting TEE to rule out endocarditis, since this isolate carries 50% endocarditis. Sensitivities suggest community acquired isolate. Likely cause of why he felt so poorly +/- 2nd infection for viral URI. If no other source found for cause of infection, we will treat as complicated bacteremia with 4 wk of IV antibiotics.  Anemia = poor response to blood transfusion thought to be inpart to cold agglutinin. Will get one unit today but anticipate to hold off unless symptomatic. Possibly related to recent URI but Mycoplasma and EBV negative. appreciate hematology recs. Getting aranesp injection Q2 wk now since hgb <10.  ESRD on HD = continue with hemodialysis m-w-f, appreciate renal recs  Cough from URI = continue with supportive care, will change cough medication to hycodin vs. Giving oxycodone or vicodin   Baxter Flattery [facility] Cell: [phone] Pager: [phone]  07/08/2015, 5:26 PM      This patient's plan of care was discussed with the house staff. Please see their note for complete details. I concur with their findings.   Carlyle Basques, MD 07/08/2015, 5:26 PM

## 2015-07-08 NOTE — Progress Notes (Signed)
Patient received his prescribed dialysis treatment. 3 kg have been removed successfully. 1 unit of PRBCs could not be given on HD due to time constraints with blood production regarding antibodies/cold agglutinin . Blood warming processes are not yet permitted in HD. PRBCs will be given outside of HD.

## 2015-07-08 NOTE — Progress Notes (Signed)
ANTIBIOTIC CONSULT NOTE - FOLLOW UP  Pharmacy Consult for Cefazolin Indication: bacteremia  No Known Allergies  Patient Measurements: Height: 5\' 8"  (172.7 cm) Weight: (!) 312 lb 6.3 oz (141.7 kg) IBW/kg (Calculated) : 68.4  Vital Signs: Temp: 97.6 F (36.4 C) (09/28 1645) Temp Source: Oral (09/28 1645) BP: 124/68 mmHg (09/28 1645) Pulse Rate: 91 (09/28 1645)  Labs:  Recent Labs  07/06/15 0336  07/07/15 0846 07/08/15 0720 07/08/15 0837  WBC 16.6*  --  17.3* 15.5*  --   HGB 6.5*  --  7.4* 6.5*  --   PLT 471*  --  454* 439*  --   CREATININE  --   < > 7.74* 9.91* 9.84*  < > = values in this interval not displayed.   Microbiology: Recent Results (from the past 720 hour(s))  MRSA PCR Screening     Status: None   Collection Time: 07/03/15  7:59 PM  Result Value Ref Range Status   MRSA by PCR NEGATIVE NEGATIVE Final    Comment:        The GeneXpert MRSA Assay (FDA approved for NASAL specimens only), is one component of a comprehensive MRSA colonization surveillance program. It is not intended to diagnose MRSA infection nor to guide or monitor treatment for MRSA infections.   Culture, blood (routine x 2)     Status: None   Collection Time: 07/04/15  1:30 PM  Result Value Ref Range Status   Specimen Description BLOOD DIALYSIS  Final   Special Requests BOTTLES DRAWN AEROBIC AND ANAEROBIC 10CC  Final   Culture  Setup Time   Final    GRAM POSITIVE COCCI IN CLUSTERS AEROBIC BOTTLE ONLY CRITICAL RESULT CALLED TO, READ BACK BY AND VERIFIED WITH: A MINTZ 07/05/15 @ [place]  Final   Report Status 07/07/2015 FINAL  Final   Organism ID, Bacteria STAPHYLOCOCCUS LUGDUNENSIS  Final      Susceptibility   Staphylococcus lugdunensis - MIC*    CIPROFLOXACIN <=0.5 SENSITIVE Sensitive     ERYTHROMYCIN <=0.25 SENSITIVE Sensitive     GENTAMICIN <=0.5 SENSITIVE Sensitive     OXACILLIN 0.5 SENSITIVE Sensitive     TETRACYCLINE <=1  SENSITIVE Sensitive     VANCOMYCIN <=0.5 SENSITIVE Sensitive     TRIMETH/SULFA <=10 SENSITIVE Sensitive     CLINDAMYCIN <=0.25 SENSITIVE Sensitive     RIFAMPIN <=0.5 SENSITIVE Sensitive     Inducible Clindamycin NEGATIVE Sensitive     * STAPHYLOCOCCUS LUGDUNENSIS  Culture, blood (routine x 2)     Status: None   Collection Time: 07/04/15  1:45 PM  Result Value Ref Range Status   Specimen Description BLOOD DIALYSIS  Final   Special Requests BOTTLES DRAWN AEROBIC AND ANAEROBIC 10CC  Final   Culture  Setup Time   Final    GRAM POSITIVE COCCI IN CLUSTERS AEROBIC BOTTLE ONLY CONFIRMED BY V WILKINS CRITICAL RESULT CALLED TO, READ BACK BY AND VERIFIED WITH: K HAYWOOD 07/06/15 @1114  M VESTAL    Culture   Final    STAPHYLOCOCCUS LUGDUNENSIS SUSCEPTIBILITIES PERFORMED ON PREVIOUS CULTURE WITHIN THE LAST 5 DAYS.    Report Status 07/07/2015 FINAL  Final  Culture, blood (routine x 2)     Status: None (Preliminary result)   Collection Time: 07/07/15  2:35 PM  Result Value Ref Range Status   Specimen Description BLOOD LEFT ANTECUBITAL  Final   Special Requests BOTTLES DRAWN AEROBIC ONLY  5CC  Final   Culture NO GROWTH <  12 HOURS  Final   Report Status PENDING  Incomplete  Culture, blood (routine x 2)     Status: None (Preliminary result)   Collection Time: 07/07/15  3:00 PM  Result Value Ref Range Status   Specimen Description BLOOD LEFT HAND  Final   Special Requests IN PEDIATRIC BOTTLE 1CC  Final   Culture NO GROWTH < 24 HOURS  Final   Report Status PENDING  Incomplete   Assessment:   Day # 4 antibiotics, Vanc 9/25>>9/27; Nafcillin 9/27>>9/28.   Changing from Nafcillin to Cefazolin for Staph lugdunensis bacteremia.   ESRD, usual MWF dialysis.  Already dialyzed this morning, but currently receiving transfusion.    Goal of Therapy:  appropriate Cefazolin dose for renal function and infection  Plan:   Cefazolin 2 grams IV MWF after hemodialysis.  Begin tonight after transfusion  completed.  Arty Baumgartner, [place] Pager: [phone] 07/08/2015,6:02 PM

## 2015-07-08 NOTE — Progress Notes (Signed)
07/08/2015  4:50 PM  This RN and Rapid Response set up the blood warmer for the patient to receive his one unit of blood per MD orders.  Blood warmer was indicated by Blood Bank.  No complications were noted at this time.  Will continue to assess and monitor the patient.   [employer], RN-BC, [employer] [facility] Phone [phone]

## 2015-07-08 NOTE — Progress Notes (Signed)
[name], male   DOB: [birth year], 49 y.o.   MRN: [id number] Hematology: Still with suboptimal response to transfusion. Haptoglobin decreased compared with baseline, retic count 5% so low grade hemolysis still a consideration. PT & PTT both elevated @ 16.4 & 43 seconds off heparin; D-dimer 4.1; fibrinogen 789 but still possible we are seeing low grade DIC to partially explain low grade hemolysis given positive blood cultures for staph. IgM antibody titers vs EBV & Mycoplasma are negative. ID on bug from blood: Staph Lugdunensis, penicillin sensitive. Cold agglutinin titers pending. Low titer would suggest benign etiology. Aranesp started - typically takes 3-4 weeks to show effect.  No new rec at this time. Continue to monitor labs/clinical status. Repeat LDH, bilirubin, haptoglobin Minimize subsequent transfusions since he is clinically asymptomatic despite low Hb.

## 2015-07-08 NOTE — Progress Notes (Signed)
Subjective: Patient seen and examined while receiving HD.  Patient states he continues to feel fine despite low hemoglobin.  States cough and MSK pain associated with cough have improved some today.  No other complaints.  Objective: Vital signs in last 24 hours: Filed Vitals:   07/08/15 0900 07/08/15 0930 07/08/15 1000 07/08/15 1043  BP: 117/77 145/80 143/86 103/80  Pulse: 98 100 105 101  Temp:    97.1 F (36.2 C)  TempSrc:    Oral  Resp: 21 20 22 19   Height:      Weight:    312 lb 6.3 oz (141.7 kg)  SpO2:    100%   Weight change: 3 lb 1.4 oz (1.4 kg)  Intake/Output Summary (Last 24 hours) at 07/08/15 1431 Last data filed at 07/08/15 1043  Gross per 24 hour  Intake    660 ml  Output   3010 ml  Net  -2350 ml   General: sitting up in chair at dialysis, normal mood and affect HEENT: EOMI, no scleral icterus Cardiac: distant heart sounds, RRR, no rubs, murmurs or gallops Pulm: CTAB, normal work of breathing.  Abd: soft, nontender, nondistended, BS present Ext: warm and well perfused, no pedal edema Neuro: alert and oriented X3  Lab Results: Basic Metabolic Panel:7  Recent Labs Lab 07/06/15 0709  07/08/15 0720 07/08/15 0837  NA 126*  < > 125* 125*  K 4.0  < > 3.9 3.9  CL 86*  < > 88* 87*  CO2 22  < > 21* 20*  GLUCOSE 286*  < > 293* 289*  BUN 99*  < > 87* 88*  CREATININE 10.11*  < > 9.91* 9.84*  CALCIUM 9.2  < > 8.7* 8.7*  PHOS 7.9*  --   --  7.2*  < > = values in this interval not displayed. Liver Function Tests:  Recent Labs Lab 07/05/15 0453  07/06/15 1235 07/08/15 0837  AST 26  --  21  --   ALT 22  --  20  --   ALKPHOS 97  --  98  --   BILITOT 1.3*  --  1.6*  --   PROT 9.2*  --  9.0*  --   ALBUMIN 3.2*  < > 3.3* 3.3*  < > = values in this interval not displayed. No results for input(s): LIPASE, AMYLASE in the last 168 hours. No results for input(s): AMMONIA in the last 168 hours. CBC:  Recent Labs Lab 07/07/15 0846 07/08/15 0720  WBC 17.3*  15.5*  NEUTROABS 12.9* 11.5*  HGB 7.4* 6.5*  HCT 21.9* 19.2*  MCV 89.8 90.6  PLT 454* 439*   Cardiac Enzymes: No results for input(s): CKTOTAL, CKMB, CKMBINDEX, TROPONINI in the last 168 hours. BNP: No results for input(s): PROBNP in the last 168 hours. D-Dimer:  Recent Labs Lab 07/07/15 0846  DDIMER 4.14*   CBG:  Recent Labs Lab 07/07/15 0753 07/07/15 1119 07/07/15 1713 07/07/15 2113 07/08/15 1115 07/08/15 1318  GLUCAP 260* 321* 273* 257* 206* 300*   Hemoglobin A1C:  Recent Labs Lab 07/03/15 1455  HGBA1C 8.0*   Fasting Lipid Panel: No results for input(s): CHOL, HDL, LDLCALC, TRIG, CHOLHDL, LDLDIRECT in the last 168 hours. Thyroid Function Tests: No results for input(s): TSH, T4TOTAL, FREET4, T3FREE, THYROIDAB in the last 168 hours. Coagulation:  Recent Labs Lab 07/07/15 0846  LABPROT 16.4*  INR 1.30   Anemia Panel:  Recent Labs Lab 07/04/15 1800 07/06/15 1130  TIBC 221*  --  IRON 193*  --   RETICCTPCT  --  5.3*   Urine Drug Screen: Drugs of Abuse  No results found for: LABOPIA, COCAINSCRNUR, LABBENZ, AMPHETMU, THCU, LABBARB  Alcohol Level: No results for input(s): ETH in the last 168 hours. Urinalysis: No results for input(s): COLORURINE, LABSPEC, PHURINE, GLUCOSEU, HGBUR, BILIRUBINUR, KETONESUR, PROTEINUR, UROBILINOGEN, NITRITE, LEUKOCYTESUR in the last 168 hours.  Invalid input(s): APPERANCEUR  Micro Results: Recent Results (from the past 240 hour(s))  MRSA PCR Screening     Status: None   Collection Time: 07/03/15  7:59 PM  Result Value Ref Range Status   MRSA by PCR NEGATIVE NEGATIVE Final    Comment:        The GeneXpert MRSA Assay (FDA approved for NASAL specimens only), is one component of a comprehensive MRSA colonization surveillance program. It is not intended to diagnose MRSA infection nor to guide or monitor treatment for MRSA infections.   Culture, blood (routine x 2)     Status: None   Collection Time: 07/04/15   1:30 PM  Result Value Ref Range Status   Specimen Description BLOOD DIALYSIS  Final   Special Requests BOTTLES DRAWN AEROBIC AND ANAEROBIC 10CC  Final   Culture  Setup Time   Final    GRAM POSITIVE COCCI IN CLUSTERS AEROBIC BOTTLE ONLY CRITICAL RESULT CALLED TO, READ BACK BY AND VERIFIED WITH: A MINTZ 07/05/15 @ [place]  Final   Report Status 07/07/2015 FINAL  Final   Organism ID, Bacteria STAPHYLOCOCCUS LUGDUNENSIS  Final      Susceptibility   Staphylococcus lugdunensis - MIC*    CIPROFLOXACIN <=0.5 SENSITIVE Sensitive     ERYTHROMYCIN <=0.25 SENSITIVE Sensitive     GENTAMICIN <=0.5 SENSITIVE Sensitive     OXACILLIN 0.5 SENSITIVE Sensitive     TETRACYCLINE <=1 SENSITIVE Sensitive     VANCOMYCIN <=0.5 SENSITIVE Sensitive     TRIMETH/SULFA <=10 SENSITIVE Sensitive     CLINDAMYCIN <=0.25 SENSITIVE Sensitive     RIFAMPIN <=0.5 SENSITIVE Sensitive     Inducible Clindamycin NEGATIVE Sensitive     * STAPHYLOCOCCUS LUGDUNENSIS  Culture, blood (routine x 2)     Status: None   Collection Time: 07/04/15  1:45 PM  Result Value Ref Range Status   Specimen Description BLOOD DIALYSIS  Final   Special Requests BOTTLES DRAWN AEROBIC AND ANAEROBIC 10CC  Final   Culture  Setup Time   Final    GRAM POSITIVE COCCI IN CLUSTERS AEROBIC BOTTLE ONLY CONFIRMED BY V WILKINS CRITICAL RESULT CALLED TO, READ BACK BY AND VERIFIED WITH: K HAYWOOD 07/06/15 @1114  M VESTAL    Culture   Final    STAPHYLOCOCCUS LUGDUNENSIS SUSCEPTIBILITIES PERFORMED ON PREVIOUS CULTURE WITHIN THE LAST 5 DAYS.    Report Status 07/07/2015 FINAL  Final  Culture, blood (routine x 2)     Status: None (Preliminary result)   Collection Time: 07/07/15  2:35 PM  Result Value Ref Range Status   Specimen Description BLOOD LEFT ANTECUBITAL  Final   Special Requests BOTTLES DRAWN AEROBIC ONLY  5CC  Final   Culture NO GROWTH < 24 HOURS  Final   Report Status PENDING  Incomplete  Culture, blood  (routine x 2)     Status: None (Preliminary result)   Collection Time: 07/07/15  3:00 PM  Result Value Ref Range Status   Specimen Description BLOOD LEFT HAND  Final   Special Requests IN PEDIATRIC BOTTLE [place]  Final   Culture  NO GROWTH < 24 HOURS  Final   Report Status PENDING  Incomplete   Studies/Results: Dg Orthopantogram  07/07/2015   CLINICAL DATA:  Decaying teeth.  Cardiac procedure tomorrow.  EXAM: ORTHOPANTOGRAM/PANORAMIC  COMPARISON:  CT 12/17/2005  FINDINGS: Mandible demonstrates no evidence of fracture. There is degenerative change of the left temporomandibular joint. There is poor dentition with multiple dental caries as well as multiple missing upper and lower teeth. There is a 1.1 cm oval calcification just below the angle of the right mandible known to be within the superior aspect of the right mandibular gland on previous CT scan.  IMPRESSION: Poor dentition with multiple dental caries and multiple missing upper and lower teeth.  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  1.1 cm calcification over the superior aspect of the right mandibular gland.   Electronically Signed   By: Marin Olp M.D.   On: 07/07/2015 17:09   Medications:  Scheduled Meds: . sodium chloride   Intravenous Once  . sodium chloride   Intravenous Once  . sodium chloride   Intravenous Once  . amLODipine  10 mg Oral Daily  . aspirin EC  81 mg Oral Daily  . benzonatate  100 mg Oral TID  . calcium acetate  1,334 mg Oral TID WC  . darbepoetin (ARANESP) injection - DIALYSIS  150 mcg Intravenous Q Mon-HD  . doxercalciferol  2.5 mcg Intravenous Q M,W,F-HD  . enoxaparin (LOVENOX) injection  30 mg Subcutaneous Q24H  . HYDROcodone-homatropine  5 mL Oral Q4H  . insulin aspart  0-5 Units Subcutaneous QHS  . insulin aspart  0-9 Units Subcutaneous TID WC  . insulin aspart  4 Units Subcutaneous TID WC  . insulin glargine  10 Units Subcutaneous QHS  . multivitamin  1 tablet Oral QHS  . nafcillin IV   2 g Intravenous 4 times per day  . pneumococcal 23 valent vaccine  0.5 mL Intramuscular Tomorrow-1000   Continuous Infusions:  PRN Meds:.sodium chloride, sodium chloride, alteplase, heparin, ipratropium-albuterol, lidocaine (PF), lidocaine-prilocaine, pentafluoroprop-tetrafluoroeth Assessment/Plan: Active Problems:   DM II (diabetes mellitus, type II), controlled   Essential hypertension   End stage renal disease on dialysis   Normocytic anemia   OSA (obstructive sleep apnea)   Shortness of breath   Absolute anemia   Cold agglutinin disease   Bronchitis  49 y.o. AA male with past medical history of DM type II, HTN, ESRD with MWF HD, and OSA who presents to the ED with 2.5 weeks of feeling bad with what he describes as cold like symptoms, runny nose.  Leukocytosis - likely 2/2 staph Lugdunesis bacteremia -WBC at admission of 18.4 with 78% PMNs trending down to 15.5 w 74% PMNs -Blood cultures 2/2 positive for GPC in clusters, aerobic bottles >> Staph Lugdunensis (pan sensitive) -Vancomycin per pharmacy (received 2 days of Vanc) >> Nafcillin 2g IV QID.  Will plan to treat for 4 weeks. -Trans thoracic echo without evidence of endocarditis as source of bacteremia -Orthopantogram: poor dentition with multiple dental caries and multiple missing and upper lower teeth.  Degenerative changes of the left TMJ -Plan for TEE -Repeat blood cultures x 2 drawn 9/27 NGTD  Acute worsening on Chronic Anemia Has baseline anemia from CKD, hgb last outpatient HD 10.3 on 9/12. -Hgb 7.5 on admission, down from 11.3 on 9/14.  Subsequent CBCs with Hgb 6.8 >> 6.4>s/p 2 unit transfusion> 7.3>>7.0>>6.5<<1 unit transfusion>> 7.4>>6.5, will transfuse 1 unit today -MCV 94.1. Iron 193, LDH 257>> 321, Reticulocyte % 4.1>> 5.3,  Smear- no schistocytes seen, haptoglobin 430. FOBT neg. Tibil 1.3 >>1.6 , urine hemosiderin is pending -Consult hematology on 9/26, appreciate recommendations.             - unlikely this is a  delayed transfusion reaction.   - EBV and Mycoplasma IgM and IgG negative, possibly benign              - will use warmer during transfusion to prevent cold-agglutanins          - continue to monitor CBC closely and transfuse as necessary -recheck LDH, bilirubin, haptoglobin per hematology recs -monitor CBC closely  Cough - likely viral bronchitis. S/p Azithromycin x5 days outpatient.: Patient reports cough is improving -chest x-ray on admission compared to 9/14 shows improved right bibasilar atelectasis -strep pneumo, legionella pending -mycoplasma negative -Hycodan q4h -Tessalon 100mg  TID -Duonebs q6h prn  Diabetes mellitus -sliding scale insulin, Lantus 10 units qhs, Novolog 4units TID with meals -Hgb A1c pending since 9/23  ESRD on M,W,F HD at [facility] recs. - HD 9/28  Inpatient, will continue MWF   Hypertension -Continue amlodipine.   Hyponatremia - corrected sodium 132.  -likely due to hyperglycemia combined with possible increased volume from renal disease -continue to monitor  Hyperkalemia - improved with kayex + HD -potassium was found to be 6.6 initially. Improved with kayex and also HD.  - K 3.9 today  OSA -CPAP nightly   Dispo: Disposition is deferred at this time, awaiting improvement of current medical problems.    The patient does have a current PCP (Tresa Garter, MD) and does not need an [facility] hospital follow-up appointment after discharge.  The patient does not have transportation limitations that hinder transportation to clinic appointments.    LOS: 5 days   Jule Ser, DO 07/08/2015, 2:31 PM

## 2015-07-08 NOTE — Progress Notes (Signed)
[facility] ROUNDING NOTE   Subjective:   Interval History: no complaints  Objective:  Vital signs in last 24 hours:  Temp:  [97.1 F (36.2 C)-98.5 F (36.9 C)] 97.1 F (36.2 C) (09/28 1043) Pulse Rate:  [85-105] 101 (09/28 1043) Resp:  [18-23] 19 (09/28 1043) BP: (103-146)/(58-91) 103/80 mmHg (09/28 1043) SpO2:  [99 %-100 %] 100 % (09/28 1043) Weight:  [141.7 kg (312 lb 6.3 oz)-145 kg (319 lb 10.7 oz)] 141.7 kg (312 lb 6.3 oz) (09/28 1043)  Weight change: 1.4 kg (3 lb 1.4 oz) Filed Weights   07/06/15 1121 07/08/15 0700 07/08/15 1043  Weight: 140.8 kg (310 lb 6.5 oz) 145 kg (319 lb 10.7 oz) 141.7 kg (312 lb 6.3 oz)    Intake/Output: I/O last 3 completed shifts: In: [id number] [P.O.:1080; Blood:335; IV Piggyback:300] Out: 0    Intake/Output this shift:  Total I/O In: -  Out: 3010 [Other:3010]  General: alert obese pleasant AA male NAD, [name]  Heart: RRR no mur , rub or gal Lungs: CTA bilat , nonlab breathing  Abdomen: obese , soft NT Extremities: no pedal edema Dialysis Access: R FA AVF pos bruit    Basic Metabolic Panel:  Recent Labs Lab 07/05/15 0453 07/06/15 0709 07/07/15 0846 07/08/15 0720 07/08/15 0837  NA 129* 126* 128* 125* 125*  K 4.4 4.0 3.9 3.9 3.9  CL 87* 86* 89* 88* 87*  CO2 26 22 25  21* 20*  GLUCOSE 307* 286* 277* 293* 289*  BUN 77* 99* 66* 87* 88*  CREATININE 8.28* 10.11* 7.74* 9.91* 9.84*  CALCIUM 9.3 9.2 9.1 8.7* 8.7*  PHOS  --  7.9*  --   --  7.2*    Liver Function Tests:  Recent Labs Lab 07/04/15 1341 07/05/15 0453 07/06/15 0709 07/06/15 1235 07/08/15 0837  AST 19 26  --  21  --   ALT 19 22  --  20  --   ALKPHOS 105 97  --  98  --   BILITOT 1.4* 1.3*  --  1.6*  --   PROT 10.1* 9.2*  --  9.0*  --   ALBUMIN 3.2* 3.2* 3.3* 3.3* 3.3*   No results for input(s): LIPASE, AMYLASE in the last 168 hours. No results for input(s): AMMONIA in the last 168 hours.  CBC:  Recent Labs Lab 07/04/15 0512  07/05/15 0453  07/05/15 1416 07/06/15 0336 07/07/15 0846 07/08/15 0720  WBC 28.4*  < > 22.1* 20.6* 16.6* 17.3* 15.5*  NEUTROABS 24.4*  --  16.7*  --  11.6* 12.9* 11.5*  HGB 6.8*  < > 7.3* 7.0* 6.5* 7.4* 6.5*  HCT 18.0*  < > 21.4* 20.3* 19.2* 21.9* 19.2*  MCV 100.0  < > 88.4 86.8 88.9 89.8 90.6  PLT PLATELET CLUMPS NOTED ON SMEAR, COUNT APPEARS INCREASED  < > 490* 520* 471* 454* 439*  < > = values in this interval not displayed.  Cardiac Enzymes: No results for input(s): CKTOTAL, CKMB, CKMBINDEX, TROPONINI in the last 168 hours.  BNP: Invalid input(s): POCBNP  CBG:  Recent Labs Lab 07/07/15 0753 07/07/15 1119 07/07/15 1713 07/07/15 2113 07/08/15 1115  GLUCAP 260* 321* 273* 41* 206*    Microbiology: Results for orders placed or performed during the hospital encounter of 07/03/15  MRSA PCR Screening     Status: None   Collection Time: 07/03/15  7:59 PM  Result Value Ref Range Status   MRSA by PCR NEGATIVE NEGATIVE Final    Comment:  The GeneXpert MRSA Assay (FDA approved for NASAL specimens only), is one component of a comprehensive MRSA colonization surveillance program. It is not intended to diagnose MRSA infection nor to guide or monitor treatment for MRSA infections.   Culture, blood (routine x 2)     Status: None   Collection Time: 07/04/15  1:30 PM  Result Value Ref Range Status   Specimen Description BLOOD DIALYSIS  Final   Special Requests BOTTLES DRAWN AEROBIC AND ANAEROBIC 10CC  Final   Culture  Setup Time   Final    GRAM POSITIVE COCCI IN CLUSTERS AEROBIC BOTTLE ONLY CRITICAL RESULT CALLED TO, READ BACK BY AND VERIFIED WITH: A MINTZ 07/05/15 @ [place]  Final   Report Status 07/07/2015 FINAL  Final   Organism ID, Bacteria STAPHYLOCOCCUS LUGDUNENSIS  Final      Susceptibility   Staphylococcus lugdunensis - MIC*    CIPROFLOXACIN <=0.5 SENSITIVE Sensitive     ERYTHROMYCIN <=0.25 SENSITIVE Sensitive     GENTAMICIN  <=0.5 SENSITIVE Sensitive     OXACILLIN 0.5 SENSITIVE Sensitive     TETRACYCLINE <=1 SENSITIVE Sensitive     VANCOMYCIN <=0.5 SENSITIVE Sensitive     TRIMETH/SULFA <=10 SENSITIVE Sensitive     CLINDAMYCIN <=0.25 SENSITIVE Sensitive     RIFAMPIN <=0.5 SENSITIVE Sensitive     Inducible Clindamycin NEGATIVE Sensitive     * STAPHYLOCOCCUS LUGDUNENSIS  Culture, blood (routine x 2)     Status: None   Collection Time: 07/04/15  1:45 PM  Result Value Ref Range Status   Specimen Description BLOOD DIALYSIS  Final   Special Requests BOTTLES DRAWN AEROBIC AND ANAEROBIC 10CC  Final   Culture  Setup Time   Final    GRAM POSITIVE COCCI IN CLUSTERS AEROBIC BOTTLE ONLY CONFIRMED BY V WILKINS CRITICAL RESULT CALLED TO, READ BACK BY AND VERIFIED WITH: K HAYWOOD 07/06/15 @1114  M VESTAL    Culture   Final    STAPHYLOCOCCUS LUGDUNENSIS SUSCEPTIBILITIES PERFORMED ON PREVIOUS CULTURE WITHIN THE LAST 5 DAYS.    Report Status 07/07/2015 FINAL  Final    Coagulation Studies:  Recent Labs  07/07/15 0846  LABPROT 16.4*  INR 1.30    Urinalysis: No results for input(s): COLORURINE, LABSPEC, PHURINE, GLUCOSEU, HGBUR, BILIRUBINUR, KETONESUR, PROTEINUR, UROBILINOGEN, NITRITE, LEUKOCYTESUR in the last 72 hours.  Invalid input(s): APPERANCEUR    Imaging: Dg Orthopantogram  07/07/2015   CLINICAL DATA:  Decaying teeth.  Cardiac procedure tomorrow.  EXAM: ORTHOPANTOGRAM/PANORAMIC  COMPARISON:  CT 12/17/2005  FINDINGS: Mandible demonstrates no evidence of fracture. There is degenerative change of the left temporomandibular joint. There is poor dentition with multiple dental caries as well as multiple missing upper and lower teeth. There is a 1.1 cm oval calcification just below the angle of the right mandible known to be within the superior aspect of the right mandibular gland on previous CT scan.  IMPRESSION: Poor dentition with multiple dental caries and multiple missing upper and lower teeth.  Degenerative  changes of the left temporomandibular joint.  1.1 cm calcification over the superior aspect of the right mandibular gland.   Electronically Signed   By: Marin Olp M.D.   On: 07/07/2015 17:09     Medications:     . sodium chloride   Intravenous Once  . sodium chloride   Intravenous Once  . sodium chloride   Intravenous Once  . amLODipine  10 mg Oral Daily  . aspirin EC  81 mg Oral  Daily  . benzonatate  100 mg Oral TID  . calcium acetate  1,334 mg Oral TID WC  . darbepoetin (ARANESP) injection - DIALYSIS  150 mcg Intravenous Q Mon-HD  . doxercalciferol  2.5 mcg Intravenous Q M,W,F-HD  . enoxaparin (LOVENOX) injection  30 mg Subcutaneous Q24H  . HYDROcodone-homatropine  5 mL Oral Q4H  . insulin aspart  0-5 Units Subcutaneous QHS  . insulin aspart  0-9 Units Subcutaneous TID WC  . insulin aspart  4 Units Subcutaneous TID WC  . insulin glargine  10 Units Subcutaneous QHS  . multivitamin  1 tablet Oral QHS  . nafcillin IV  2 g Intravenous 4 times per day  . pneumococcal 23 valent vaccine  0.5 mL Intramuscular Tomorrow-1000   sodium chloride, sodium chloride, alteplase, heparin, ipratropium-albuterol, lidocaine (PF), lidocaine-prilocaine, pentafluoroprop-tetrafluoroeth  Assessment/ Plan:  1. Anemia- Admit team wu/ heme  2. ESRD -MWF HD schedule ( [facility]) 3. HTN/volume - controlled 4. Secondary hyperparathyroidism - Phoslo and Hectorol iv on hd 5. Cough / "bronchitis - admit team RX 6. DM - per admit  7. Obesity with OSA - use cpap/ trying to lose wt taper edw as bp allows  Patient seen on dialysis with no issuds    LOS: 5 WEBB,[place] W @TODAY @11 :28 AM

## 2015-07-09 ENCOUNTER — Inpatient Hospital Stay (HOSPITAL_COMMUNITY): Payer: Medicare Other

## 2015-07-09 LAB — CBC WITH DIFFERENTIAL/PLATELET
BASOS ABS: 0.1 10*3/uL (ref 0.0–0.1)
BASOS PCT: 1 %
EOS ABS: 0.4 10*3/uL (ref 0.0–0.7)
Eosinophils Relative: 3 %
HEMATOCRIT: 23.8 % — AB (ref 39.0–52.0)
HEMOGLOBIN: 8.5 g/dL — AB (ref 13.0–17.0)
Lymphocytes Relative: 15 %
Lymphs Abs: 2.3 10*3/uL (ref 0.7–4.0)
MCH: 32.6 pg (ref 26.0–34.0)
MCHC: 35.7 g/dL (ref 30.0–36.0)
MCV: 91.2 fL (ref 78.0–100.0)
Monocytes Absolute: 1 10*3/uL (ref 0.1–1.0)
Monocytes Relative: 7 %
NEUTROS ABS: 11.2 10*3/uL — AB (ref 1.7–7.7)
NEUTROS PCT: 75 %
Platelets: 385 10*3/uL (ref 150–400)
RBC: 2.61 MIL/uL — ABNORMAL LOW (ref 4.22–5.81)
RDW: 15.9 % — AB (ref 11.5–15.5)
WBC: 15 10*3/uL — ABNORMAL HIGH (ref 4.0–10.5)

## 2015-07-09 LAB — GLUCOSE, CAPILLARY
GLUCOSE-CAPILLARY: 265 mg/dL — AB (ref 65–99)
GLUCOSE-CAPILLARY: 255 mg/dL — AB (ref 65–99)
GLUCOSE-CAPILLARY: 220 mg/dL — AB (ref 65–99)
Glucose-Capillary: 252 mg/dL — ABNORMAL HIGH (ref 65–99)

## 2015-07-09 LAB — BASIC METABOLIC PANEL
ANION GAP: 15 (ref 5–15)
BUN: 62 mg/dL — ABNORMAL HIGH (ref 6–20)
CALCIUM: 9.2 mg/dL (ref 8.9–10.3)
CO2: 23 mmol/L (ref 22–32)
Chloride: 90 mmol/L — ABNORMAL LOW (ref 101–111)
Creatinine, Ser: 8.01 mg/dL — ABNORMAL HIGH (ref 0.61–1.24)
GFR calc non Af Amer: 7 mL/min — ABNORMAL LOW (ref >60)
GFR, EST AFRICAN AMERICAN: 8 mL/min — AB (ref >60)
Glucose, Bld: 239 mg/dL — ABNORMAL HIGH (ref 65–99)
Potassium: 5 mmol/L (ref 3.5–5.1)
SODIUM: 128 mmol/L — AB (ref 135–145)

## 2015-07-09 LAB — HEPATITIS B SURFACE ANTIGEN: Hepatitis B Surface Ag: NEGATIVE

## 2015-07-09 LAB — LACTATE DEHYDROGENASE: LDH: 356 U/L — AB (ref 98–192)

## 2015-07-09 LAB — COLD AGGLUTININ TITER

## 2015-07-09 LAB — HEMOSIDERIN, URINE: Hemosiderin Qual, Ur: NEGATIVE

## 2015-07-09 MED ORDER — BENZONATATE 100 MG PO CAPS
200.0000 mg | ORAL_CAPSULE | Freq: Three times a day (TID) | ORAL | Status: DC
  Administered 2015-07-09 – 2015-07-14 (×15): 200 mg via ORAL
  Filled 2015-07-09 (×16): qty 2

## 2015-07-09 MED ORDER — GUAIFENESIN-DM 100-10 MG/5ML PO SYRP
5.0000 mL | ORAL_SOLUTION | ORAL | Status: DC
  Administered 2015-07-09 – 2015-07-10 (×5): 5 mL via ORAL
  Filled 2015-07-09 (×5): qty 5

## 2015-07-09 MED ORDER — ALBUTEROL SULFATE (2.5 MG/3ML) 0.083% IN NEBU
2.5000 mg | INHALATION_SOLUTION | Freq: Four times a day (QID) | RESPIRATORY_TRACT | Status: DC
  Administered 2015-07-09: 2.5 mg via RESPIRATORY_TRACT
  Filled 2015-07-09: qty 3

## 2015-07-09 MED ORDER — HYDROCODONE-ACETAMINOPHEN 5-325 MG PO TABS
1.0000 | ORAL_TABLET | Freq: Every day | ORAL | Status: DC
  Administered 2015-07-09 – 2015-07-13 (×5): 1 via ORAL
  Filled 2015-07-09 (×5): qty 1

## 2015-07-09 MED ORDER — IPRATROPIUM-ALBUTEROL 0.5-2.5 (3) MG/3ML IN SOLN
3.0000 mL | RESPIRATORY_TRACT | Status: DC | PRN
  Filled 2015-07-09: qty 3

## 2015-07-09 MED ORDER — HEPARIN SODIUM (PORCINE) 5000 UNIT/ML IJ SOLN
5000.0000 [IU] | Freq: Three times a day (TID) | INTRAMUSCULAR | Status: DC
  Administered 2015-07-09 – 2015-07-14 (×13): 5000 [IU] via SUBCUTANEOUS
  Filled 2015-07-09 (×13): qty 1

## 2015-07-09 NOTE — Interval H&P Note (Signed)
History and Physical Interval Note:  07/09/2015 3:48 PM  Seth [name]  has presented today for surgery, with the diagnosis of BACTERINA  The various methods of treatment have been discussed with the patient and family. After consideration of risks, benefits and other options for treatment, the patient has consented to  Procedure(s): TRANSESOPHAGEAL ECHOCARDIOGRAM (TEE) (N/A) as a surgical intervention .  The patient's history has been reviewed, patient examined, no change in status, stable for surgery.  I have reviewed the patient's chart and labs.  Questions were answered to the patient's satisfaction.     Jenkins Rouge

## 2015-07-09 NOTE — Progress Notes (Signed)
Subjective: Patient received 1 unit PRBCs yesterday.  Only complaint today is continued dry cough, worse at night and associated back pain.  Otherwise feels well.  Plan for TEE tomorrow.  Objective: Vital signs in last 24 hours: Filed Vitals:   07/08/15 2041 07/09/15 0548 07/09/15 1000 07/09/15 1154  BP: 125/84 120/76 136/75   Pulse: 91 96 92   Temp:  98.3 F (36.8 C) 98.7 F (37.1 C)   TempSrc:   Oral   Resp: 23 22 22    Height:      Weight: 317 lb (143.79 kg)     SpO2: 98% 94% 96% 94%   Weight change: -7 lb 4.4 oz (-3.3 kg)  Intake/Output Summary (Last 24 hours) at 07/09/15 1226 Last data filed at 07/09/15 0900  Gross per 24 hour  Intake    845 ml  Output      0 ml  Net    845 ml   General: sitting up in chair in room, normal mood and affect, no distress HEENT: EOMI, no scleral icterus Cardiac: distant heart sounds, RRR, no rubs, murmurs or gallops Pulm: coarse breath sounds on R side, normal work of breathing.  Abd: soft, nontender, nondistended, BS present Ext: warm and well perfused, no pedal edema Neuro: alert and oriented X3  Lab Results: Basic Metabolic Panel:7  Recent Labs Lab 07/06/15 0709  07/08/15 0837 07/09/15 0750  NA 126*  < > 125* 128*  K 4.0  < > 3.9 5.0  CL Seth*  < > 87* 90*  CO2 22  < > 20* 23  GLUCOSE 286*  < > 289* 239*  BUN 99*  < > 88* 62*  CREATININE 10.11*  < > 9.84* 8.01*  CALCIUM 9.2  < > 8.7* 9.2  PHOS 7.9*  --  7.2*  --   < > = values in this interval not displayed. Liver Function Tests:  Recent Labs Lab 07/05/15 0453  07/06/15 1235 07/08/15 0837  AST 26  --  21  --   ALT 22  --  20  --   ALKPHOS 97  --  98  --   BILITOT 1.3*  --  1.6*  --   PROT 9.2*  --  9.0*  --   ALBUMIN 3.2*  < > 3.3* 3.3*  < > = values in this interval not displayed. No results for input(s): LIPASE, AMYLASE in the last 168 hours. No results for input(s): AMMONIA in the last 168 hours. CBC:  Recent Labs Lab 07/08/15 0720 07/09/15 0750  WBC  15.5* 15.0*  NEUTROABS 11.5* 11.2*  HGB 6.5* 8.5*  HCT 19.2* 23.8*  MCV 90.6 91.2  PLT 439* 385   Cardiac Enzymes: No results for input(s): CKTOTAL, CKMB, CKMBINDEX, TROPONINI in the last 168 hours. BNP: No results for input(s): PROBNP in the last 168 hours. D-Dimer:  Recent Labs Lab 07/07/15 0846  DDIMER 4.14*   CBG:  Recent Labs Lab 07/08/15 1115 07/08/15 1318 07/08/15 1621 07/08/15 2036 07/09/15 0735 07/09/15 1139  GLUCAP 206* 300* 325* 228* 265* 255*   Hemoglobin A1C:  Recent Labs Lab 07/03/15 1455  HGBA1C 8.0*   Fasting Lipid Panel: No results for input(s): CHOL, HDL, LDLCALC, TRIG, CHOLHDL, LDLDIRECT in the last 168 hours. Thyroid Function Tests: No results for input(s): TSH, T4TOTAL, FREET4, T3FREE, THYROIDAB in the last 168 hours. Coagulation:  Recent Labs Lab 07/07/15 0846  LABPROT 16.4*  INR 1.30   Anemia Panel:  Recent Labs Lab 07/04/15 1800 07/06/15  1130  TIBC 221*  --   IRON 193*  --   RETICCTPCT  --  5.3*   Urine Drug Screen: Drugs of Abuse  No results found for: LABOPIA, COCAINSCRNUR, LABBENZ, AMPHETMU, THCU, LABBARB  Alcohol Level: No results for input(s): ETH in the last 168 hours. Urinalysis: No results for input(s): COLORURINE, LABSPEC, PHURINE, GLUCOSEU, HGBUR, BILIRUBINUR, KETONESUR, PROTEINUR, UROBILINOGEN, NITRITE, LEUKOCYTESUR in the last 168 hours.  Invalid input(s): APPERANCEUR  Micro Results: Recent Results (from the past 240 hour(s))  MRSA PCR Screening     Status: None   Collection Time: 07/03/15  7:59 PM  Result Value Ref Range Status   MRSA by PCR NEGATIVE NEGATIVE Final    Comment:        The GeneXpert MRSA Assay (FDA approved for NASAL specimens only), is one component of a comprehensive MRSA colonization surveillance program. It is not intended to diagnose MRSA infection nor to guide or monitor treatment for MRSA infections.   Culture, blood (routine x 2)     Status: None   Collection Time:  07/04/15  1:30 PM  Result Value Ref Range Status   Specimen Description BLOOD DIALYSIS  Final   Special Requests BOTTLES DRAWN AEROBIC AND ANAEROBIC 10CC  Final   Culture  Setup Time   Final    GRAM POSITIVE COCCI IN CLUSTERS AEROBIC BOTTLE ONLY CRITICAL RESULT CALLED TO, READ BACK BY AND VERIFIED WITH: A MINTZ 07/05/15 @ [place]  Final   Report Status 07/07/2015 FINAL  Final   Organism ID, Bacteria STAPHYLOCOCCUS LUGDUNENSIS  Final      Susceptibility   Staphylococcus lugdunensis - MIC*    CIPROFLOXACIN <=0.5 SENSITIVE Sensitive     ERYTHROMYCIN <=0.25 SENSITIVE Sensitive     GENTAMICIN <=0.5 SENSITIVE Sensitive     OXACILLIN 0.5 SENSITIVE Sensitive     TETRACYCLINE <=1 SENSITIVE Sensitive     VANCOMYCIN <=0.5 SENSITIVE Sensitive     TRIMETH/SULFA <=10 SENSITIVE Sensitive     CLINDAMYCIN <=0.25 SENSITIVE Sensitive     RIFAMPIN <=0.5 SENSITIVE Sensitive     Inducible Clindamycin NEGATIVE Sensitive     * STAPHYLOCOCCUS LUGDUNENSIS  Culture, blood (routine x 2)     Status: None   Collection Time: 07/04/15  1:45 PM  Result Value Ref Range Status   Specimen Description BLOOD DIALYSIS  Final   Special Requests BOTTLES DRAWN AEROBIC AND ANAEROBIC 10CC  Final   Culture  Setup Time   Final    GRAM POSITIVE COCCI IN CLUSTERS AEROBIC BOTTLE ONLY CONFIRMED BY V WILKINS CRITICAL RESULT CALLED TO, READ BACK BY AND VERIFIED WITH: K HAYWOOD 07/06/15 @1114  M VESTAL    Culture   Final    STAPHYLOCOCCUS LUGDUNENSIS SUSCEPTIBILITIES PERFORMED ON PREVIOUS CULTURE WITHIN THE LAST 5 DAYS.    Report Status 07/07/2015 FINAL  Final  Culture, blood (routine x 2)     Status: None (Preliminary result)   Collection Time: 07/07/15  2:35 PM  Result Value Ref Range Status   Specimen Description BLOOD LEFT ANTECUBITAL  Final   Special Requests BOTTLES DRAWN AEROBIC ONLY  5CC  Final   Culture NO GROWTH < 24 HOURS  Final   Report Status PENDING  Incomplete    Culture, blood (routine x 2)     Status: None (Preliminary result)   Collection Time: 07/07/15  3:00 PM  Result Value Ref Range Status   Specimen Description BLOOD LEFT HAND  Final   Special Requests  IN PEDIATRIC BOTTLE 1CC  Final   Culture NO GROWTH < 24 HOURS  Final   Report Status PENDING  Incomplete   Studies/Results: Dg Orthopantogram  07/07/2015   CLINICAL DATA:  Decaying teeth.  Cardiac procedure tomorrow.  EXAM: ORTHOPANTOGRAM/PANORAMIC  COMPARISON:  CT 12/17/2005  FINDINGS: Mandible demonstrates no evidence of fracture. There is degenerative change of the left temporomandibular joint. There is poor dentition with multiple dental caries as well as multiple missing upper and lower teeth. There is a 1.1 cm oval calcification just below the angle of the right mandible known to be within the superior aspect of the right mandibular gland on previous CT scan.  IMPRESSION: Poor dentition with multiple dental caries and multiple missing upper and lower teeth.  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  1.1 cm calcification over the superior aspect of the right mandibular gland.   Electronically Signed   By: Marin Olp M.D.   On: 07/07/2015 17:09   Medications:  Scheduled Meds: . sodium chloride   Intravenous Once  . sodium chloride   Intravenous Once  . sodium chloride   Intravenous Once  . albuterol  2.5 mg Nebulization Q6H  . amLODipine  10 mg Oral Daily  . aspirin EC  81 mg Oral Daily  . benzonatate  200 mg Oral TID  . calcium acetate  1,334 mg Oral TID WC  .  ceFAZolin (ANCEF) IV  2 g Intravenous Q M,W,F-HD  . darbepoetin (ARANESP) injection - DIALYSIS  150 mcg Intravenous Q Mon-HD  . doxercalciferol  2.5 mcg Intravenous Q M,W,F-HD  . enoxaparin (LOVENOX) injection  30 mg Subcutaneous Q24H  . guaiFENesin-dextromethorphan  5 mL Oral Q4H  . HYDROcodone-acetaminophen  1 tablet Oral QHS  . insulin aspart  0-5 Units Subcutaneous QHS  . insulin aspart  0-9 Units  Subcutaneous TID WC  . insulin aspart  4 Units Subcutaneous TID WC  . insulin glargine  10 Units Subcutaneous QHS  . multivitamin  1 tablet Oral QHS  . pneumococcal 23 valent vaccine  0.5 mL Intramuscular Tomorrow-1000  . senna-docusate  2 tablet Oral QHS   Continuous Infusions:  PRN Meds:.sodium chloride, sodium chloride, alteplase, heparin, ipratropium-albuterol, lidocaine (PF), lidocaine-prilocaine, pentafluoroprop-tetrafluoroeth Assessment/Plan: Active Problems:   DM II (diabetes mellitus, type II), controlled   Essential hypertension   End stage renal disease on dialysis   Normocytic anemia   OSA (obstructive sleep apnea)   Shortness of breath   Absolute anemia   Cold agglutinin disease   Bronchitis  49 y.o. AA [name] with past medical history of DM type II, HTN, ESRD with MWF HD, and OSA who presents to the ED with 2.5 weeks of feeling bad with what he describes as cold like symptoms, runny nose.  Leukocytosis - likely 2/2 staph Lugdunesis bacteremia -WBC at admission of 18.4 with 78% PMNs trending down to 15.0 today -Blood cultures 2/2 positive for GPC in clusters, aerobic bottles >> Staph Lugdunensis (pan sensitive) -Vancomycin per pharmacy (received 2 days of Vanc) >> Nafcillin 2g IV QID >> Cefazolin 2g every MWF w/ HD.  Will plan to treat for 4 weeks. -Trans thoracic echo without evidence of endocarditis as source of bacteremia -Orthopantogram: poor dentition with multiple dental caries and multiple missing and upper lower teeth.  Degenerative changes of the left TMJ -Plan for TEE tomorrow -Repeat blood cultures x 2 drawn 9/27 NGTD  Acute worsening on Chronic Anemia Has baseline anemia from CKD, hgb last outpatient HD 10.3 on 9/12. -Hgb  7.5 on admission, down from 11.3 on 9/14.  Subsequent CBCs with Hgb 6.8 >> 6.4>s/p 2 unit transfusion> 7.3>>7.0>>6.5<<1 unit transfusion>> 7.4>>6.5 >> 8.5 s/p 1 unit PRBCs -MCV 94.1. Iron 193, LDH 257>> 321, Reticulocyte % 4.1>> 5.3, Smear-  no schistocytes seen, haptoglobin 430. FOBT neg. Tibil 1.3 >>1.6 , urine hemosiderin is pending -Consult hematology on 9/26, appreciate recommendations.             - unlikely this is a delayed transfusion reaction.   - EBV and Mycoplasma IgM and IgG negative, possibly benign              - will use warmer during transfusion to prevent cold-agglutanins          - continue to monitor CBC closely and transfuse as necessary -repeat LDH 356. Recheck of bilirubin, haptoglobin per hematology recs pending -monitor CBC closely  Cough - likely viral bronchitis. S/p Azithromycin x5 days outpatient.: Patient reports cough is improving -chest x-ray on admission compared to 9/14 shows improved right bibasilar atelectasis -strep pneumo, legionella pending -mycoplasma negative -Robitussin DM q4h -Tessalon 200mg  TID -Albuterol nebs q6h -Duonebs q6h prn -Norco qhs -Repeat CXR today  Diabetes mellitus -sliding scale insulin, Lantus 10 units qhs, Novolog 4units TID with meals -Hgb A1c 8.0  ESRD on M,W,F HD at [facility] recs. - HD 9/28  Inpatient, will continue MWF   Hypertension -Continue amlodipine.   Hyponatremia  -likely due to hyperglycemia combined with possible increased volume from renal disease -continue to monitor  Hyperkalemia - improved with kayex + HD -potassium was found to be 6.6 initially. Improved with kayex and also HD.  - K 5.0 today  OSA -CPAP nightly   Dispo: Disposition is deferred at this time, awaiting improvement of current medical problems.    The patient does have a current PCP (Tresa Garter, MD) and does not need an [facility] hospital follow-up appointment after discharge.  The patient does not have transportation limitations that hinder transportation to clinic appointments.    LOS: 6 days   Jule Ser, DO 07/09/2015, 12:26 PM

## 2015-07-09 NOTE — Progress Notes (Signed)
Date: 07/09/2015  Patient name: Seth [name]  Medical record number: [id number]  Date of birth: 1965-11-03    Date of Admission:  07/03/2015   Total days of antibiotics 4        Day 2 nafcillin           ID: JAMORIAN HATTER is a 49 y.o. male with ESRD on HD, well controlled DM admitted for malaise flu like illness found to have leukocytosis of 18K, new onset anemia. Initially treated for asthma with steroids.  insufficient response to blood transfusion, and also found to have staph ludgenescens  Active Problems:   DM II (diabetes mellitus, type II), controlled   Essential hypertension   End stage renal disease on dialysis   Normocytic anemia   OSA (obstructive sleep apnea)   Shortness of breath   Absolute anemia   Cold agglutinin disease   Bronchitis    Subjective: He reports poor sleep due to persistent coughing worse at night. Cough syrup not as effective. Denies fever.    He had blood transfusion yesterday for which had appropriate response  Medications:  . sodium chloride   Intravenous Once  . sodium chloride   Intravenous Once  . sodium chloride   Intravenous Once  . amLODipine  10 mg Oral Daily  . aspirin EC  81 mg Oral Daily  . benzonatate  200 mg Oral TID  . calcium acetate  1,334 mg Oral TID WC  .  ceFAZolin (ANCEF) IV  2 g Intravenous Q M,W,F-HD  . darbepoetin (ARANESP) injection - DIALYSIS  150 mcg Intravenous Q Mon-HD  . doxercalciferol  2.5 mcg Intravenous Q M,W,F-HD  . guaiFENesin-dextromethorphan  5 mL Oral Q4H  . heparin subcutaneous  5,000 Units Subcutaneous 3 times per day  . HYDROcodone-acetaminophen  1 tablet Oral QHS  . insulin aspart  0-5 Units Subcutaneous QHS  . insulin aspart  0-9 Units Subcutaneous TID WC  . insulin aspart  4 Units Subcutaneous TID WC  . insulin glargine  10 Units Subcutaneous QHS  . multivitamin  1 tablet Oral QHS  . pneumococcal 23 valent vaccine  0.5 mL Intramuscular Tomorrow-1000  . senna-docusate  2 tablet Oral QHS      Objective: Vital signs in last 24 hours: Temp:  [98.2 F (36.8 C)-98.7 F (37.1 C)] 98.2 F (36.8 C) (09/29 2044) Pulse Rate:  [88-96] 88 (09/29 2044) Resp:  [19-22] 19 (09/29 2044) BP: (120-146)/(75-94) 146/94 mmHg (09/29 2044) SpO2:  [94 %-100 %] 100 % (09/29 2044)  Physical Exam  Constitutional: He is oriented to person, place, and time. He appears well-developed and well-nourished. No distress. Sitting in chair HENT:  Mouth/Throat: Oropharynx is clear and moist. No oropharyngeal exudate.  Cardiovascular: Normal rate, regular rhythm and normal heart sounds. Exam reveals no gallop and no friction rub.  No murmur heard.  Pulmonary/Chest: Effort normal and breath sounds normal. No respiratory distress. He has no wheezes. Decrease breath sounds at left base Neurological: He is alert and oriented to person, place, and time.  Skin: Skin is warm and dry. No rash noted. No erythema.  Psychiatric: He has a normal mood and affect. His behavior is normal.     Lab Results  Recent Labs  07/08/15 0720 07/08/15 0837 07/09/15 0750  WBC 15.5*  --  15.0*  HGB 6.5*  --  8.5*  HCT 19.2*  --  23.8*  NA 125* 125* 128*  K 3.9 3.9 5.0  CL 88* 87* 90*  CO2 21*  20* 23  BUN 87* 88* 62*  CREATININE 9.91* 9.84* 8.01*   Liver Panel  Recent Labs  07/08/15 0837  ALBUMIN 3.3*    Microbiology: 9/24 blood cx x2 staph ludgenes. 9/27 blood cx pending Studies/Results: Dg Chest 2 View  07/09/2015   CLINICAL DATA:  Cough, shortness of breath, hypertension, diabetes mellitus, sleep apnea  EXAM: CHEST  2 VIEW  COMPARISON:  07/03/2015  FINDINGS: Mild enlargement of cardiac silhouette.  Mediastinal contours and pulmonary vascularity normal.  Subsegmental atelectasis at RIGHT lung base versus cysts.  No acute infiltrate, pleural effusion, or pneumothorax.  Chronic compression deformity of the anterior aspect of an upper thoracic vertebral body appears unchanged  IMPRESSION: Persistent RIGHT basilar  atelectasis.  Mild enlargement of cardiac silhouette.   Electronically Signed   By: Lavonia Dana M.D.   On: 07/09/2015 12:59     Assessment/Plan: Staph LUGDUNENSIS bacteremia = currently on cefazolin to minimize any other addn hemolytic anemia associated with nafcillin. Repeat blood pending.  awaiting TEE to rule out endocarditis, since this isolate carries 50% endocarditis. Sensitivities suggest community acquired isolate. Likely cause of why he felt so poorly +/- 2nd infection for viral URI. If no other source found for cause of infection, we will treat as complicated bacteremia with 4 wk of IV antibiotics.  Anemia = improved response to blood transfusion thought to be inpart to cold agglutinin. Will get one unit today but anticipate to hold off unless symptomatic. Possibly related to recent URI but Mycoplasma and EBV negative. appreciate hematology recs. Getting aranesp injection Q2 wk now since hgb <10.  ESRD on HD = continue with hemodialysis m-w-f, appreciate renal recs  Cough from URI = continue with supportive care, will change cough medication to robitussin vs. Giving oxycodone or vicodin tonight to help with sleep. Will repeat cxr to ensure no evolving effusion   Baxter Flattery [facility] Cell: [phone] Pager: [phone]  07/09/2015, 9:15 PM      This patient's plan of care was discussed with the house staff. Please see their note for complete details. I concur with their findings.   Carlyle Basques, MD 07/09/2015, 9:15 PM

## 2015-07-09 NOTE — Progress Notes (Signed)
Patient has 3 labs ordered for today but phlebotomy has been unsuccessful in their attempts. Patient is declining to be stuck again today and is requesting to have them drawn in HD tomorrow. MD on call notified of this and stated this was fine.   Joellen Jersey, RN.

## 2015-07-09 NOTE — Progress Notes (Signed)
Pt stated he will place himself on cpap when ready. RT informed pt to call for RT if he needs any assistance during the night

## 2015-07-09 NOTE — Progress Notes (Signed)
Pt declined signing the consent for a TEE at this time. He stated that he would like to get his cough under control before proceeding with the procedure, and would like to discuss with the the physician in the morning. He understands that he will be NPO until a decision is made.

## 2015-07-09 NOTE — Progress Notes (Signed)
[facility] ROUNDING NOTE   Subjective:   Interval History: no complaints  Objective:  Vital signs in last 24 hours:  Temp:  [97.6 F (36.4 C)-98.7 F (37.1 C)] 98.7 F (37.1 C) (09/29 1000) Pulse Rate:  [91-100] 92 (09/29 1000) Resp:  [16-23] 22 (09/29 1000) BP: (120-140)/(68-90) 136/75 mmHg (09/29 1000) SpO2:  [94 %-99 %] 96 % (09/29 1000) Weight:  [143.79 kg (317 lb)] 143.79 kg (317 lb) (09/28 2041)  Weight change: -3.3 kg (-7 lb 4.4 oz) Filed Weights   07/08/15 0700 07/08/15 1043 07/08/15 2041  Weight: 145 kg (319 lb 10.7 oz) 141.7 kg (312 lb 6.3 oz) 143.79 kg (317 lb)    Intake/Output: I/O last 3 completed shifts: In: 1045 [P.O.:480; Blood:315; IV Piggyback:250] Out: 3010 [Other:3010]   Intake/Output this shift:  Total I/O In: 120 [P.O.:120] Out: -   General: alert obese pleasant AA male NAD, [name]  Heart: RRR no mur , rub or gal Lungs: CTA bilat , nonlab breathing  Abdomen: obese , soft NT Extremities: no pedal edema Dialysis Access: R FA AVF pos bruit    Basic Metabolic Panel:  Recent Labs Lab 07/06/15 0709 07/07/15 0846 07/08/15 0720 07/08/15 0837 07/09/15 0750  NA 126* 128* 125* 125* 128*  K 4.0 3.9 3.9 3.9 5.0  CL 86* 89* 88* 87* 90*  CO2 22 25 21* 20* 23  GLUCOSE 286* 277* 293* 289* 239*  BUN 99* 66* 87* 88* 62*  CREATININE 10.11* 7.74* 9.91* 9.84* 8.01*  CALCIUM 9.2 9.1 8.7* 8.7* 9.2  PHOS 7.9*  --   --  7.2*  --     Liver Function Tests:  Recent Labs Lab 07/04/15 1341 07/05/15 0453 07/06/15 0709 07/06/15 1235 07/08/15 0837  AST 19 26  --  21  --   ALT 19 22  --  20  --   ALKPHOS 105 97  --  98  --   BILITOT 1.4* 1.3*  --  1.6*  --   PROT 10.1* 9.2*  --  9.0*  --   ALBUMIN 3.2* 3.2* 3.3* 3.3* 3.3*   No results for input(s): LIPASE, AMYLASE in the last 168 hours. No results for input(s): AMMONIA in the last 168 hours.  CBC:  Recent Labs Lab 07/05/15 0453 07/05/15 1416 07/06/15 0336 07/07/15 0846  07/08/15 0720 07/09/15 0750  WBC 22.1* 20.6* 16.6* 17.3* 15.5* 15.0*  NEUTROABS 16.7*  --  11.6* 12.9* 11.5* 11.2*  HGB 7.3* 7.0* 6.5* 7.4* 6.5* 8.5*  HCT 21.4* 20.3* 19.2* 21.9* 19.2* 23.8*  MCV 88.4 86.8 88.9 89.8 90.6 91.2  PLT 490* 520* 471* 454* 439* 385    Cardiac Enzymes: No results for input(s): CKTOTAL, CKMB, CKMBINDEX, TROPONINI in the last 168 hours.  BNP: Invalid input(s): POCBNP  CBG:  Recent Labs Lab 07/08/15 1115 07/08/15 1318 07/08/15 1621 07/08/15 2036 07/09/15 0735  GLUCAP 206* 300* 325* 52* 265*    Microbiology: Results for orders placed or performed during the hospital encounter of 07/03/15  MRSA PCR Screening     Status: None   Collection Time: 07/03/15  7:59 PM  Result Value Ref Range Status   MRSA by PCR NEGATIVE NEGATIVE Final    Comment:        The GeneXpert MRSA Assay (FDA approved for NASAL specimens only), is one component of a comprehensive MRSA colonization surveillance program. It is not intended to diagnose MRSA infection nor to guide or monitor treatment for MRSA infections.   Culture, blood (routine x  2)     Status: None   Collection Time: 07/04/15  1:30 PM  Result Value Ref Range Status   Specimen Description BLOOD DIALYSIS  Final   Special Requests BOTTLES DRAWN AEROBIC AND ANAEROBIC 10CC  Final   Culture  Setup Time   Final    GRAM POSITIVE COCCI IN CLUSTERS AEROBIC BOTTLE ONLY CRITICAL RESULT CALLED TO, READ BACK BY AND VERIFIED WITH: A MINTZ 07/05/15 @ [place]  Final   Report Status 07/07/2015 FINAL  Final   Organism ID, Bacteria STAPHYLOCOCCUS LUGDUNENSIS  Final      Susceptibility   Staphylococcus lugdunensis - MIC*    CIPROFLOXACIN <=0.5 SENSITIVE Sensitive     ERYTHROMYCIN <=0.25 SENSITIVE Sensitive     GENTAMICIN <=0.5 SENSITIVE Sensitive     OXACILLIN 0.5 SENSITIVE Sensitive     TETRACYCLINE <=1 SENSITIVE Sensitive     VANCOMYCIN <=0.5 SENSITIVE Sensitive      TRIMETH/SULFA <=10 SENSITIVE Sensitive     CLINDAMYCIN <=0.25 SENSITIVE Sensitive     RIFAMPIN <=0.5 SENSITIVE Sensitive     Inducible Clindamycin NEGATIVE Sensitive     * STAPHYLOCOCCUS LUGDUNENSIS  Culture, blood (routine x 2)     Status: None   Collection Time: 07/04/15  1:45 PM  Result Value Ref Range Status   Specimen Description BLOOD DIALYSIS  Final   Special Requests BOTTLES DRAWN AEROBIC AND ANAEROBIC 10CC  Final   Culture  Setup Time   Final    GRAM POSITIVE COCCI IN CLUSTERS AEROBIC BOTTLE ONLY CONFIRMED BY V WILKINS CRITICAL RESULT CALLED TO, READ BACK BY AND VERIFIED WITH: K HAYWOOD 07/06/15 @1114  M VESTAL    Culture   Final    STAPHYLOCOCCUS LUGDUNENSIS SUSCEPTIBILITIES PERFORMED ON PREVIOUS CULTURE WITHIN THE LAST 5 DAYS.    Report Status 07/07/2015 FINAL  Final  Culture, blood (routine x 2)     Status: None (Preliminary result)   Collection Time: 07/07/15  2:35 PM  Result Value Ref Range Status   Specimen Description BLOOD LEFT ANTECUBITAL  Final   Special Requests BOTTLES DRAWN AEROBIC ONLY  5CC  Final   Culture NO GROWTH < 24 HOURS  Final   Report Status PENDING  Incomplete  Culture, blood (routine x 2)     Status: None (Preliminary result)   Collection Time: 07/07/15  3:00 PM  Result Value Ref Range Status   Specimen Description BLOOD LEFT HAND  Final   Special Requests IN PEDIATRIC BOTTLE 1CC  Final   Culture NO GROWTH < 24 HOURS  Final   Report Status PENDING  Incomplete    Coagulation Studies:  Recent Labs  07/07/15 0846  LABPROT 16.4*  INR 1.30    Urinalysis: No results for input(s): COLORURINE, LABSPEC, PHURINE, GLUCOSEU, HGBUR, BILIRUBINUR, KETONESUR, PROTEINUR, UROBILINOGEN, NITRITE, LEUKOCYTESUR in the last 72 hours.  Invalid input(s): APPERANCEUR    Imaging: Dg Orthopantogram  07/07/2015   CLINICAL DATA:  Decaying teeth.  Cardiac procedure tomorrow.  EXAM: ORTHOPANTOGRAM/PANORAMIC  COMPARISON:  CT 12/17/2005  FINDINGS: Mandible  demonstrates no evidence of fracture. There is degenerative change of the left temporomandibular joint. There is poor dentition with multiple dental caries as well as multiple missing upper and lower teeth. There is a 1.1 cm oval calcification just below the angle of the right mandible known to be within the superior aspect of the right mandibular gland on previous CT scan.  IMPRESSION: Poor dentition with multiple dental caries and multiple missing upper and  lower teeth.  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  1.1 cm calcification over the superior aspect of the right mandibular gland.   Electronically Signed   By: Marin Olp M.D.   On: 07/07/2015 17:09     Medications:     . sodium chloride   Intravenous Once  . sodium chloride   Intravenous Once  . sodium chloride   Intravenous Once  . albuterol  2.5 mg Nebulization Q6H  . amLODipine  10 mg Oral Daily  . aspirin EC  81 mg Oral Daily  . benzonatate  200 mg Oral TID  . calcium acetate  1,334 mg Oral TID WC  .  ceFAZolin (ANCEF) IV  2 g Intravenous Q M,W,F-HD  . darbepoetin (ARANESP) injection - DIALYSIS  150 mcg Intravenous Q Mon-HD  . doxercalciferol  2.5 mcg Intravenous Q M,W,F-HD  . enoxaparin (LOVENOX) injection  30 mg Subcutaneous Q24H  . HYDROcodone-homatropine  5 mL Oral Q4H  . insulin aspart  0-5 Units Subcutaneous QHS  . insulin aspart  0-9 Units Subcutaneous TID WC  . insulin aspart  4 Units Subcutaneous TID WC  . insulin glargine  10 Units Subcutaneous QHS  . multivitamin  1 tablet Oral QHS  . pneumococcal 23 valent vaccine  0.5 mL Intramuscular Tomorrow-1000  . senna-docusate  2 tablet Oral QHS   sodium chloride, sodium chloride, alteplase, heparin, ipratropium-albuterol, lidocaine (PF), lidocaine-prilocaine, pentafluoroprop-tetrafluoroeth  Assessment/ Plan:  1. Anemia- Admit team wu/ heme  2. ESRD -MWF HD schedule ( [facility]) 3. HTN/volume - controlled 4. Secondary hyperparathyroidism -  Phoslo and Hectorol iv on hd 5. Cough / "bronchitis - admit team RX 6. DM - per admit  7. Obesity with OSA - use cpap/ trying to lose wt taper edw as bp allows  Plan dialysis in AM    LOS: 6 WEBB,Fielden W @TODAY @11 :07 AM

## 2015-07-10 ENCOUNTER — Inpatient Hospital Stay (HOSPITAL_COMMUNITY): Payer: Medicare Other

## 2015-07-10 ENCOUNTER — Encounter (HOSPITAL_COMMUNITY): Payer: Self-pay

## 2015-07-10 ENCOUNTER — Encounter (HOSPITAL_COMMUNITY)
Admission: EM | Disposition: A | Discharge status: Home Health | Payer: Medicare Other | Source: Home / Self Care | Attending: Internal Medicine

## 2015-07-10 DIAGNOSIS — R0602 Shortness of breath: Secondary | ICD-10-CM

## 2015-07-10 HISTORY — PX: TEE WITHOUT CARDIOVERSION: SHX5443

## 2015-07-10 LAB — CBC WITH DIFFERENTIAL/PLATELET
BASOS ABS: 0.1 10*3/uL (ref 0.0–0.1)
BASOS PCT: 1 %
EOS PCT: 3 %
Eosinophils Absolute: 0.3 10*3/uL (ref 0.0–0.7)
HCT: 19.6 % — ABNORMAL LOW (ref 39.0–52.0)
Hemoglobin: 7 g/dL — ABNORMAL LOW (ref 13.0–17.0)
Lymphocytes Relative: 18 %
Lymphs Abs: 2 10*3/uL (ref 0.7–4.0)
MCH: 32.9 pg (ref 26.0–34.0)
MCHC: 35.7 g/dL (ref 30.0–36.0)
MCV: 92 fL (ref 78.0–100.0)
MONO ABS: 0.6 10*3/uL (ref 0.1–1.0)
MONOS PCT: 5 %
Neutro Abs: 8.2 10*3/uL — ABNORMAL HIGH (ref 1.7–7.7)
Neutrophils Relative %: 73 %
PLATELETS: 414 10*3/uL — AB (ref 150–400)
RBC: 2.13 MIL/uL — ABNORMAL LOW (ref 4.22–5.81)
RDW: 15.2 % (ref 11.5–15.5)
WBC: 11.1 10*3/uL — ABNORMAL HIGH (ref 4.0–10.5)

## 2015-07-10 LAB — BASIC METABOLIC PANEL
Anion gap: 15 (ref 5–15)
BUN: 80 mg/dL — AB (ref 6–20)
CALCIUM: 8.7 mg/dL — AB (ref 8.9–10.3)
CO2: 24 mmol/L (ref 22–32)
CREATININE: 11.19 mg/dL — AB (ref 0.61–1.24)
Chloride: 88 mmol/L — ABNORMAL LOW (ref 101–111)
GFR calc Af Amer: 5 mL/min — ABNORMAL LOW (ref >60)
GFR, EST NON AFRICAN AMERICAN: 5 mL/min — AB (ref >60)
GLUCOSE: 219 mg/dL — AB (ref 65–99)
Potassium: 3.9 mmol/L (ref 3.5–5.1)
Sodium: 127 mmol/L — ABNORMAL LOW (ref 135–145)

## 2015-07-10 LAB — PREPARE RBC (CROSSMATCH)

## 2015-07-10 LAB — BILIRUBIN, FRACTIONATED(TOT/DIR/INDIR)
BILIRUBIN DIRECT: 0.2 mg/dL (ref 0.1–0.5)
BILIRUBIN INDIRECT: 1.1 mg/dL — AB (ref 0.3–0.9)
BILIRUBIN TOTAL: 1.3 mg/dL — AB (ref 0.3–1.2)

## 2015-07-10 LAB — GLUCOSE, CAPILLARY
GLUCOSE-CAPILLARY: 204 mg/dL — AB (ref 65–99)
GLUCOSE-CAPILLARY: 209 mg/dL — AB (ref 65–99)
Glucose-Capillary: 191 mg/dL — ABNORMAL HIGH (ref 65–99)
Glucose-Capillary: 271 mg/dL — ABNORMAL HIGH (ref 65–99)

## 2015-07-10 SURGERY — ECHOCARDIOGRAM, TRANSESOPHAGEAL
Anesthesia: Moderate Sedation

## 2015-07-10 MED ORDER — GUAIFENESIN ER 600 MG PO TB12
600.0000 mg | ORAL_TABLET | Freq: Two times a day (BID) | ORAL | Status: DC
  Administered 2015-07-10 – 2015-07-14 (×9): 600 mg via ORAL
  Filled 2015-07-10 (×9): qty 1

## 2015-07-10 MED ORDER — FENTANYL CITRATE (PF) 100 MCG/2ML IJ SOLN
INTRAMUSCULAR | Status: DC | PRN
  Administered 2015-07-10: 50 ug via INTRAVENOUS
  Administered 2015-07-10 (×2): 25 ug via INTRAVENOUS

## 2015-07-10 MED ORDER — HYDROCOD POLST-CPM POLST ER 10-8 MG/5ML PO SUER
5.0000 mL | Freq: Two times a day (BID) | ORAL | Status: DC
  Administered 2015-07-10 – 2015-07-14 (×9): 5 mL via ORAL
  Filled 2015-07-10 (×9): qty 5

## 2015-07-10 MED ORDER — BUTAMBEN-TETRACAINE-BENZOCAINE 2-2-14 % EX AERO
INHALATION_SPRAY | CUTANEOUS | Status: DC | PRN
  Administered 2015-07-10: 2 via TOPICAL

## 2015-07-10 MED ORDER — HYDROCOD POLST-CPM POLST ER 10-8 MG/5ML PO SUER: 5.0000 mL | Freq: Four times a day (QID) | ORAL | Status: DC

## 2015-07-10 MED ORDER — POLYETHYLENE GLYCOL 3350 17 G PO PACK
17.0000 g | PACK | Freq: Every day | ORAL | Status: DC
  Administered 2015-07-11 – 2015-07-14 (×4): 17 g via ORAL
  Filled 2015-07-10 (×4): qty 1

## 2015-07-10 MED ORDER — FENTANYL CITRATE (PF) 100 MCG/2ML IJ SOLN
INTRAMUSCULAR | Status: AC
  Filled 2015-07-10: qty 2

## 2015-07-10 MED ORDER — MIDAZOLAM HCL 5 MG/ML IJ SOLN
INTRAMUSCULAR | Status: AC
  Filled 2015-07-10: qty 2

## 2015-07-10 MED ORDER — MIDAZOLAM HCL 10 MG/2ML IJ SOLN
INTRAMUSCULAR | Status: DC | PRN
Start: 2015-07-10 — End: 2015-07-10
  Administered 2015-07-10 (×3): 2 mg via INTRAVENOUS

## 2015-07-10 MED ORDER — SODIUM CHLORIDE 0.9 % IV SOLN: Freq: Once | INTRAVENOUS | Status: DC

## 2015-07-10 MED ORDER — DOXERCALCIFEROL 4 MCG/2ML IV SOLN
INTRAVENOUS | Status: AC
  Administered 2015-07-10: 2.5 ug via INTRAVENOUS
  Filled 2015-07-10: qty 2

## 2015-07-10 NOTE — Progress Notes (Addendum)
Subjective: Patient doing well today, still complaining of cough at night.  States he is trying to get up and walk more because legs are feeling weak from not being up as much.  Had TEE procedure today, going for HD today as well.  Objective: Vital signs in last 24 hours: Filed Vitals:   07/10/15 1110 07/10/15 1115 07/10/15 1120 07/10/15 1130  BP: 190/113 149/83 140/95 161/82  Pulse: 91 92 92 88  Temp:      TempSrc:      Resp: 22 19 25 19   Height:      Weight:      SpO2: 100% 99% 100% 99%   Weight change:   Intake/Output Summary (Last 24 hours) at 07/10/15 1450 Last data filed at 07/10/15 1300  Gross per 24 hour  Intake    580 ml  Output      0 ml  Net    580 ml   General: standing up in hospital room, no distress HEENT: EOMI, no scleral icterus Cardiac: distant heart sounds, RRR, no rubs, murmurs or gallops Pulm: CTAB with slightly diminished breath sounds, normal work of breathing.  Abd: soft, nontender, nondistended, BS present Ext: warm and well perfused, no pedal edema Neuro: alert and oriented X3  Lab Results: Basic Metabolic Panel:7  Recent Labs Lab 07/06/15 0709  07/08/15 0837 07/09/15 0750  NA 126*  < > 125* 128*  K 4.0  < > 3.9 5.0  CL 86*  < > 87* 90*  CO2 22  < > 20* 23  GLUCOSE 286*  < > 289* 239*  BUN 99*  < > 88* 62*  CREATININE 10.11*  < > 9.84* 8.01*  CALCIUM 9.2  < > 8.7* 9.2  PHOS 7.9*  --  7.2*  --   < > = values in this interval not displayed. Liver Function Tests:  Recent Labs Lab 07/05/15 0453  07/06/15 1235 07/08/15 0837  AST 26  --  21  --   ALT 22  --  20  --   ALKPHOS 97  --  98  --   BILITOT 1.3*  --  1.6*  --   PROT 9.2*  --  9.0*  --   ALBUMIN 3.2*  < > 3.3* 3.3*  < > = values in this interval not displayed. No results for input(s): LIPASE, AMYLASE in the last 168 hours. No results for input(s): AMMONIA in the last 168 hours. CBC:  Recent Labs Lab 07/08/15 0720 07/09/15 0750  WBC 15.5* 15.0*  NEUTROABS 11.5*  11.2*  HGB 6.5* 8.5*  HCT 19.2* 23.8*  MCV 90.6 91.2  PLT 439* 385   Cardiac Enzymes: No results for input(s): CKTOTAL, CKMB, CKMBINDEX, TROPONINI in the last 168 hours. BNP: No results for input(s): PROBNP in the last 168 hours. D-Dimer:  Recent Labs Lab 07/07/15 0846  DDIMER 4.14*   CBG:  Recent Labs Lab 07/09/15 0735 07/09/15 1139 07/09/15 1652 07/09/15 2046 07/10/15 0757 07/10/15 1151  GLUCAP 265* 255* 252* 220* 204* 191*   Hemoglobin A1C:  Recent Labs Lab 07/03/15 1455  HGBA1C 8.0*   Fasting Lipid Panel: No results for input(s): CHOL, HDL, LDLCALC, TRIG, CHOLHDL, LDLDIRECT in the last 168 hours. Thyroid Function Tests: No results for input(s): TSH, T4TOTAL, FREET4, T3FREE, THYROIDAB in the last 168 hours. Coagulation:  Recent Labs Lab 07/07/15 0846  LABPROT 16.4*  INR 1.30   Anemia Panel:  Recent Labs Lab 07/04/15 1800 07/06/15 1130  TIBC 221*  --  IRON 193*  --   RETICCTPCT  --  5.3*   Urine Drug Screen: Drugs of Abuse  No results found for: LABOPIA, COCAINSCRNUR, LABBENZ, AMPHETMU, THCU, LABBARB  Alcohol Level: No results for input(s): ETH in the last 168 hours. Urinalysis: No results for input(s): COLORURINE, LABSPEC, PHURINE, GLUCOSEU, HGBUR, BILIRUBINUR, KETONESUR, PROTEINUR, UROBILINOGEN, NITRITE, LEUKOCYTESUR in the last 168 hours.  Invalid input(s): APPERANCEUR  Micro Results: Recent Results (from the past 240 hour(s))  MRSA PCR Screening     Status: None   Collection Time: 07/03/15  7:59 PM  Result Value Ref Range Status   MRSA by PCR NEGATIVE NEGATIVE Final    Comment:        The GeneXpert MRSA Assay (FDA approved for NASAL specimens only), is one component of a comprehensive MRSA colonization surveillance program. It is not intended to diagnose MRSA infection nor to guide or monitor treatment for MRSA infections.   Culture, blood (routine x 2)     Status: None   Collection Time: 07/04/15  1:30 PM  Result Value Ref  Range Status   Specimen Description BLOOD DIALYSIS  Final   Special Requests BOTTLES DRAWN AEROBIC AND ANAEROBIC 10CC  Final   Culture  Setup Time   Final    GRAM POSITIVE COCCI IN CLUSTERS AEROBIC BOTTLE ONLY CRITICAL RESULT CALLED TO, READ BACK BY AND VERIFIED WITH: A MINTZ 07/05/15 @ [place]  Final   Report Status 07/07/2015 FINAL  Final   Organism ID, Bacteria STAPHYLOCOCCUS LUGDUNENSIS  Final      Susceptibility   Staphylococcus lugdunensis - MIC*    CIPROFLOXACIN <=0.5 SENSITIVE Sensitive     ERYTHROMYCIN <=0.25 SENSITIVE Sensitive     GENTAMICIN <=0.5 SENSITIVE Sensitive     OXACILLIN 0.5 SENSITIVE Sensitive     TETRACYCLINE <=1 SENSITIVE Sensitive     VANCOMYCIN <=0.5 SENSITIVE Sensitive     TRIMETH/SULFA <=10 SENSITIVE Sensitive     CLINDAMYCIN <=0.25 SENSITIVE Sensitive     RIFAMPIN <=0.5 SENSITIVE Sensitive     Inducible Clindamycin NEGATIVE Sensitive     * STAPHYLOCOCCUS LUGDUNENSIS  Culture, blood (routine x 2)     Status: None   Collection Time: 07/04/15  1:45 PM  Result Value Ref Range Status   Specimen Description BLOOD DIALYSIS  Final   Special Requests BOTTLES DRAWN AEROBIC AND ANAEROBIC 10CC  Final   Culture  Setup Time   Final    GRAM POSITIVE COCCI IN CLUSTERS AEROBIC BOTTLE ONLY CONFIRMED BY V WILKINS CRITICAL RESULT CALLED TO, READ BACK BY AND VERIFIED WITH: K HAYWOOD 07/06/15 @1114  M VESTAL    Culture   Final    STAPHYLOCOCCUS LUGDUNENSIS SUSCEPTIBILITIES PERFORMED ON PREVIOUS CULTURE WITHIN THE LAST 5 DAYS.    Report Status 07/07/2015 FINAL  Final  Culture, blood (routine x 2)     Status: None (Preliminary result)   Collection Time: 07/07/15  2:35 PM  Result Value Ref Range Status   Specimen Description BLOOD LEFT ANTECUBITAL  Final   Special Requests BOTTLES DRAWN AEROBIC ONLY  5CC  Final   Culture NO GROWTH 3 DAYS  Final   Report Status PENDING  Incomplete  Culture, blood (routine x 2)     Status: None  (Preliminary result)   Collection Time: 07/07/15  3:00 PM  Result Value Ref Range Status   Specimen Description BLOOD LEFT HAND  Final   Special Requests IN PEDIATRIC BOTTLE [place]  Final   Culture NO  GROWTH 3 DAYS  Final   Report Status PENDING  Incomplete   Studies/Results: Dg Chest 2 View  07/09/2015   CLINICAL DATA:  Cough, shortness of breath, hypertension, diabetes mellitus, sleep apnea  EXAM: CHEST  2 VIEW  COMPARISON:  07/03/2015  FINDINGS: Mild enlargement of cardiac silhouette.  Mediastinal contours and pulmonary vascularity normal.  Subsegmental atelectasis at RIGHT lung base versus cysts.  No acute infiltrate, pleural effusion, or pneumothorax.  Chronic compression deformity of the anterior aspect of an upper thoracic vertebral body appears unchanged  IMPRESSION: Persistent RIGHT basilar atelectasis.  Mild enlargement of cardiac silhouette.   Electronically Signed   By: Lavonia Dana M.D.   On: 07/09/2015 12:59   Medications:  Scheduled Meds: . sodium chloride   Intravenous Once  . sodium chloride   Intravenous Once  . sodium chloride   Intravenous Once  . amLODipine  10 mg Oral Daily  . aspirin EC  81 mg Oral Daily  . benzonatate  200 mg Oral TID  . calcium acetate  1,334 mg Oral TID WC  .  ceFAZolin (ANCEF) IV  2 g Intravenous Q M,W,F-HD  . chlorpheniramine-HYDROcodone  5 mL Oral Q12H  . darbepoetin (ARANESP) injection - DIALYSIS  150 mcg Intravenous Q Mon-HD  . doxercalciferol  2.5 mcg Intravenous Q M,W,F-HD  . guaiFENesin  600 mg Oral BID  . heparin subcutaneous  5,000 Units Subcutaneous 3 times per day  . HYDROcodone-acetaminophen  1 tablet Oral QHS  . insulin aspart  0-5 Units Subcutaneous QHS  . insulin aspart  0-9 Units Subcutaneous TID WC  . insulin aspart  4 Units Subcutaneous TID WC  . insulin glargine  10 Units Subcutaneous QHS  . multivitamin  1 tablet Oral QHS  . pneumococcal 23 valent vaccine  0.5 mL Intramuscular Tomorrow-1000  . polyethylene glycol  17 g Oral  Daily  . senna-docusate  2 tablet Oral QHS   Continuous Infusions:  PRN Meds:.sodium chloride, sodium chloride, alteplase, heparin, ipratropium-albuterol, lidocaine (PF), lidocaine-prilocaine, pentafluoroprop-tetrafluoroeth Assessment/Plan: Active Problems:   DM II (diabetes mellitus, type II), controlled   Essential hypertension   End stage renal disease on dialysis   Normocytic anemia   OSA (obstructive sleep apnea)   Shortness of breath   Absolute anemia   Cold agglutinin disease   Bronchitis  49 y.o. AA male with past medical history of DM type II, HTN, ESRD with MWF HD, and OSA who presents to the ED with 2.5 weeks of feeling bad with what he describes as cold like symptoms, runny nose.  Staph Lugdunesis bacteremia -WBC at admission of 18.4 with 78% PMNs trending down to 15.0 yesterday.  No labs from today as they will be drawn in HD -Blood cultures 2/2 positive for GPC in clusters, aerobic bottles >> Staph Lugdunensis (pan sensitive) -will treat for 14 days with Cefazolin 2g every MWF w/ HD.  Using 9/27(repeat blood cx negative at this time) as day 1. To stop antibiotics on Oct 11th. -Orthopantogram: poor dentition with multiple dental caries and multiple missing and upper lower teeth.  Degenerative changes of the left TMJ. Recommend dental follow up -TEE today w/ EF 60%, no evidence of thrombus or vegetation  Acute worsening on Chronic Anemia Has baseline anemia from CKD, hgb last outpatient HD 10.3 on 9/12. -Hgb 7.5 on admission, down from 11.3 on 9/14.  Subsequent CBCs with Hgb 6.8 >> 6.4>s/p 2 unit transfusion> 7.3>>7.0>>6.5<<1 unit transfusion>> 7.4>>6.5 >> 8.5 s/p 1 unit PRBCs >> 7.0 today.  Will transfuse 1 unit. -Consult hematology on 9/26, appreciate recommendations.             - unlikely this is a delayed transfusion reaction.   - EBV and Mycoplasma IgM and IgG negative, possibly benign              - will use warmer during transfusion to prevent cold-agglutanins           - continue to monitor CBC closely and transfuse as necessary -repeat LDH 356. Recheck of bilirubin, haptoglobin per hematology recs pending -monitor CBC closely  Cough - likely viral bronchitis. - S/p Azithromycin x5 days outpatient -chest x-ray on admission compared to 9/14 shows improved right bibasilar atelectasis.  Repeat CXR yesterday shows persistent right basilar atelectasis. -incentive spirometry -strep pneumo negative, legionella negative,-mycoplasma negative - continue with supportive care with Tussionex q12h, Mucinex 600mg  PO BID, Tessalon 200mg  TID, Duonebs q4h prn and Norco qhs  Diabetes mellitus -sliding scale insulin, Lantus 10 units qhs, Novolog 4units TID with meals -Hgb A1c 8.0  ESRD on M,W,F HD at [place] nephrology recs. - HD today inpatient, will continue MWF   Hypertension -Continue amlodipine.   Hyponatremia  -likely due to hyperglycemia combined with possible increased volume from renal disease -continue to monitor  Hyperkalemia - improved with kayex + HD -potassium was found to be 6.6 initially. Improved with kayex and also HD.   OSA -CPAP nightly   Dispo: Disposition is deferred at this time, awaiting improvement of current medical problems.    The patient does have a current PCP (Tresa Garter, MD) and does not need an [facility] hospital follow-up appointment after discharge.  The patient does not have transportation limitations that hinder transportation to clinic appointments.    LOS: 7 days   Jule Ser, DO 07/10/2015, 2:50 PM   ---------------------------------------------------  Date: 07/10/2015  Patient name: Seth [name]  Medical record number: [id number]  Date of birth: 1966/05/07   This patient's plan of care was discussed with the house staff. Please see their note for complete details. I concur with their findings.    Carlyle Basques, MD 07/10/2015, 4:24 PM

## 2015-07-10 NOTE — Procedures (Signed)
Pt will placed self on CPAP when ready.  Pt will notify RT is assistance is needed.  Sterile water added to chamber.

## 2015-07-10 NOTE — CV Procedure (Signed)
TEE: Fentanyl 100ug  Versed 6 mg  Normal EF 60% No aortic debris Trivial AR Normal MV, TV, PV No SBE No LAA thrombus NO ASD/PFO Normal RV No effusion

## 2015-07-10 NOTE — Care Management Important Message (Signed)
Important Message  Patient Details  Name: Seth [name] MRN: [id number] Date of Birth: 01/29/1966   Medicare Important Message Given:  Yes-second notification given    Delorse Lek 07/10/2015, 3:16 PM

## 2015-07-10 NOTE — Progress Notes (Signed)
  Echocardiogram Echocardiogram Transesophageal has been performed.  Donata Clay 07/10/2015, 11:44 AM

## 2015-07-10 NOTE — Consult Note (Addendum)
   [facility] CM Inpatient Consult   07/10/2015  Seth [name] 1965/11/22 [id number]   Went back to speak with patient while in HD. Explained [facility] services. Made him aware that follow up call to [facility] will need to be made on Monday for hospital follow up appointment. Consents signed for [place] [facility] services. He reports he goes to HD on Mondays, Wednesdays, and Fridays. Reports he lives with his mother. He goes to [employer] [facility] [employer]. Will request for patient to be assigned to [place] and [facility]. Discussed with patient that he will need to follow closely with the [facility] representative as well to inquire about his benefits. He states he will.  Confirmed best contact information as [phone].   Marthenia Rolling, MSN-Ed, RN,BSN [facility] Liaison [phone]

## 2015-07-10 NOTE — Consult Note (Signed)
   [facility] CM Inpatient Consult   07/10/2015  Seth [name] 1966-05-23 [id number]   Received referral for [place] [facility] services from inpatient RNCM. Spoke with patient at bedside who endorses he goes to the [facility] in [place]. Although, he has Primary Care MD listed as [facility]. [name] states he has attempted to make an appointment at [place] but was told to call back in October. Call made to [facility] to make appointment today but was told to call back on Monday. [name] apparently has not been to [place] in a year. Went back to room to make him aware of attempts to make appointment and to sign him up with [facility]. However, he was in the bathroom. Will come back at later time. Of note, [name] endorses he has issues affording his medications. States he cannot afford the out of pocket expense when the [facility] [place] has not delivered his medications sooner. He also reports he thinks he will qualify for Medicaid but is not sure. He has not applied for Medicaid as of yet. Will follow up.  Marthenia Rolling, MSN-Ed, RN,BSN [facility] Liaison [phone]

## 2015-07-10 NOTE — Procedures (Signed)
I have seen and examined this patient and agree with the plan of care  Patient seen on dialysis with no issues  WEBB,Mencer W 07/10/2015, 12:16 PM

## 2015-07-11 DIAGNOSIS — I10 Essential (primary) hypertension: Secondary | ICD-10-CM

## 2015-07-11 DIAGNOSIS — M4640 Discitis, unspecified, site unspecified: Secondary | ICD-10-CM

## 2015-07-11 DIAGNOSIS — R7881 Bacteremia: Principal | ICD-10-CM | POA: Diagnosis present

## 2015-07-11 DIAGNOSIS — B958 Unspecified staphylococcus as the cause of diseases classified elsewhere: Secondary | ICD-10-CM

## 2015-07-11 HISTORY — DX: Discitis, unspecified, site unspecified: M46.40

## 2015-07-11 LAB — CBC WITH DIFFERENTIAL/PLATELET
BASOS ABS: 0.1 10*3/uL (ref 0.0–0.1)
Basophils Relative: 1 %
EOS ABS: 0.4 10*3/uL (ref 0.0–0.7)
Eosinophils Relative: 4 %
HCT: 23 % — ABNORMAL LOW (ref 39.0–52.0)
HEMOGLOBIN: 7.8 g/dL — AB (ref 13.0–17.0)
LYMPHS ABS: 1.7 10*3/uL (ref 0.7–4.0)
LYMPHS PCT: 15 %
MCH: 32 pg (ref 26.0–34.0)
MCHC: 33.9 g/dL (ref 30.0–36.0)
MCV: 94.3 fL (ref 78.0–100.0)
Monocytes Absolute: 0.6 10*3/uL (ref 0.1–1.0)
Monocytes Relative: 5 %
NEUTROS PCT: 75 %
Neutro Abs: 8.3 10*3/uL — ABNORMAL HIGH (ref 1.7–7.7)
PLATELETS: 384 10*3/uL (ref 150–400)
RBC: 2.44 MIL/uL — ABNORMAL LOW (ref 4.22–5.81)
RDW: 17.2 % — ABNORMAL HIGH (ref 11.5–15.5)
WBC: 11.1 10*3/uL — AB (ref 4.0–10.5)

## 2015-07-11 LAB — HAPTOGLOBIN: Haptoglobin: <10 mg/dL — ABNORMAL LOW (ref 34–200)

## 2015-07-11 LAB — CBC
HEMATOCRIT: 19.2 % — AB (ref 39.0–52.0)
HEMOGLOBIN: 7 g/dL — AB (ref 13.0–17.0)
MCH: 34 pg (ref 26.0–34.0)
MCHC: 36.5 g/dL — ABNORMAL HIGH (ref 30.0–36.0)
MCV: 93.2 fL (ref 78.0–100.0)
Platelets: 387 10*3/uL (ref 150–400)
RBC: 2.06 MIL/uL — AB (ref 4.22–5.81)
RDW: 14.7 % (ref 11.5–15.5)
WBC: 8.9 10*3/uL (ref 4.0–10.5)

## 2015-07-11 LAB — GLUCOSE, CAPILLARY
GLUCOSE-CAPILLARY: 176 mg/dL — AB (ref 65–99)
GLUCOSE-CAPILLARY: 219 mg/dL — AB (ref 65–99)
GLUCOSE-CAPILLARY: 210 mg/dL — AB (ref 65–99)
Glucose-Capillary: 188 mg/dL — ABNORMAL HIGH (ref 65–99)

## 2015-07-11 LAB — BASIC METABOLIC PANEL
ANION GAP: 13 (ref 5–15)
BUN: 45 mg/dL — AB (ref 6–20)
CHLORIDE: 91 mmol/L — AB (ref 101–111)
CO2: 27 mmol/L (ref 22–32)
Calcium: 9.1 mg/dL (ref 8.9–10.3)
Creatinine, Ser: 7.35 mg/dL — ABNORMAL HIGH (ref 0.61–1.24)
GFR, EST AFRICAN AMERICAN: 9 mL/min — AB (ref >60)
GFR, EST NON AFRICAN AMERICAN: 8 mL/min — AB (ref >60)
Glucose, Bld: 183 mg/dL — ABNORMAL HIGH (ref 65–99)
POTASSIUM: 4.2 mmol/L (ref 3.5–5.1)
SODIUM: 131 mmol/L — AB (ref 135–145)

## 2015-07-11 LAB — HIV ANTIBODY (ROUTINE TESTING W REFLEX): HIV Screen 4th Generation wRfx: NONREACTIVE

## 2015-07-11 MED ORDER — AMLODIPINE BESYLATE 5 MG PO TABS
5.0000 mg | ORAL_TABLET | Freq: Every day | ORAL | Status: DC
  Administered 2015-07-11 – 2015-07-12 (×2): 5 mg via ORAL
  Filled 2015-07-11: qty 1

## 2015-07-11 NOTE — Progress Notes (Signed)
[facility] ROUNDING NOTE   Subjective:   Interval History:  Improved cough  Objective:  Vital signs in last 24 hours:  Temp:  [97.2 F (36.2 C)-98.6 F (37 C)] 98.6 F (37 C) (10/01 0605) Pulse Rate:  [63-103] 91 (10/01 0605) Resp:  [18-24] 22 (10/01 0605) BP: (119-168)/(71-123) 129/84 mmHg (10/01 0605) SpO2:  [98 %-100 %] 100 % (10/01 0605) Weight:  [143.3 kg (315 lb 14.7 oz)-144.8 kg (319 lb 3.6 oz)] 143.3 kg (315 lb 14.7 oz) (09/30 1925)  Weight change:  Filed Weights   07/08/15 2041 07/10/15 1437 07/10/15 1925  Weight: 143.79 kg (317 lb) 144.8 kg (319 lb 3.6 oz) 143.3 kg (315 lb 14.7 oz)    Intake/Output: I/O last 3 completed shifts: In: 760 [P.O.:480; Blood:280] Out: [id number] [Other:1705]   Intake/Output this shift:  Total I/O In: 240 [P.O.:240] Out: 0  obesity CVS- RRR RS- CTA ABD- BS present soft non-distended EXT- no edema   Basic Metabolic Panel:  Recent Labs Lab 07/06/15 0709  07/08/15 0720 07/08/15 0837 07/09/15 0750 07/10/15 1514 07/11/15 1051  NA 126*  < > 125* 125* 128* 127* 131*  K 4.0  < > 3.9 3.9 5.0 3.9 4.2  CL 86*  < > 88* 87* 90* 88* 91*  CO2 22  < > 21* 20* 23 24 27   GLUCOSE 286*  < > 293* 289* 239* 219* 183*  BUN 99*  < > 87* 88* 62* 80* 45*  CREATININE 10.11*  < > 9.91* 9.84* 8.01* 11.19* 7.35*  CALCIUM 9.2  < > 8.7* 8.7* 9.2 8.7* 9.1  PHOS 7.9*  --   --  7.2*  --   --   --   < > = values in this interval not displayed.  Liver Function Tests:  Recent Labs Lab 07/05/15 0453 07/06/15 0709 07/06/15 1235 07/08/15 0837 07/10/15 1514  AST 26  --  21  --   --   ALT 22  --  20  --   --   ALKPHOS 97  --  98  --   --   BILITOT 1.3*  --  1.6*  --  1.3*  PROT 9.2*  --  9.0*  --   --   ALBUMIN 3.2* 3.3* 3.3* 3.3*  --    No results for input(s): LIPASE, AMYLASE in the last 168 hours. No results for input(s): AMMONIA in the last 168 hours.  CBC:  Recent Labs Lab 07/07/15 0846 07/08/15 0720 07/09/15 0750  07/10/15 1514 07/10/15 2340 07/11/15 1051  WBC 17.3* 15.5* 15.0* 11.1* 8.9 11.1*  NEUTROABS 12.9* 11.5* 11.2* 8.2*  --  8.3*  HGB 7.4* 6.5* 8.5* 7.0* 7.0* 7.8*  HCT 21.9* 19.2* 23.8* 19.6* 19.2* 23.0*  MCV 89.8 90.6 91.2 92.0 93.2 94.3  PLT 454* 439* 385 414* 387 384    Cardiac Enzymes: No results for input(s): CKTOTAL, CKMB, CKMBINDEX, TROPONINI in the last 168 hours.  BNP: Invalid input(s): POCBNP  CBG:  Recent Labs Lab 07/10/15 1151 07/10/15 1954 07/10/15 2101 07/11/15 0736 07/11/15 1133  GLUCAP 191* 209* 23* 176* 37*    Microbiology: Results for orders placed or performed during the hospital encounter of 07/03/15  MRSA PCR Screening     Status: None   Collection Time: 07/03/15  7:59 PM  Result Value Ref Range Status   MRSA by PCR NEGATIVE NEGATIVE Final    Comment:        The GeneXpert MRSA Assay (FDA approved for NASAL specimens only),  is one component of a comprehensive MRSA colonization surveillance program. It is not intended to diagnose MRSA infection nor to guide or monitor treatment for MRSA infections.   Culture, blood (routine x 2)     Status: None   Collection Time: 07/04/15  1:30 PM  Result Value Ref Range Status   Specimen Description BLOOD DIALYSIS  Final   Special Requests BOTTLES DRAWN AEROBIC AND ANAEROBIC 10CC  Final   Culture  Setup Time   Final    GRAM POSITIVE COCCI IN CLUSTERS AEROBIC BOTTLE ONLY CRITICAL RESULT CALLED TO, READ BACK BY AND VERIFIED WITH: A MINTZ 07/05/15 @ [place]  Final   Report Status 07/07/2015 FINAL  Final   Organism ID, Bacteria STAPHYLOCOCCUS LUGDUNENSIS  Final      Susceptibility   Staphylococcus lugdunensis - MIC*    CIPROFLOXACIN <=0.5 SENSITIVE Sensitive     ERYTHROMYCIN <=0.25 SENSITIVE Sensitive     GENTAMICIN <=0.5 SENSITIVE Sensitive     OXACILLIN 0.5 SENSITIVE Sensitive     TETRACYCLINE <=1 SENSITIVE Sensitive     VANCOMYCIN <=0.5 SENSITIVE Sensitive      TRIMETH/SULFA <=10 SENSITIVE Sensitive     CLINDAMYCIN <=0.25 SENSITIVE Sensitive     RIFAMPIN <=0.5 SENSITIVE Sensitive     Inducible Clindamycin NEGATIVE Sensitive     * STAPHYLOCOCCUS LUGDUNENSIS  Culture, blood (routine x 2)     Status: None   Collection Time: 07/04/15  1:45 PM  Result Value Ref Range Status   Specimen Description BLOOD DIALYSIS  Final   Special Requests BOTTLES DRAWN AEROBIC AND ANAEROBIC 10CC  Final   Culture  Setup Time   Final    GRAM POSITIVE COCCI IN CLUSTERS AEROBIC BOTTLE ONLY CONFIRMED BY V WILKINS CRITICAL RESULT CALLED TO, READ BACK BY AND VERIFIED WITH: K HAYWOOD 07/06/15 @1114  M VESTAL    Culture   Final    STAPHYLOCOCCUS LUGDUNENSIS SUSCEPTIBILITIES PERFORMED ON PREVIOUS CULTURE WITHIN THE LAST 5 DAYS.    Report Status 07/07/2015 FINAL  Final  Culture, blood (routine x 2)     Status: None (Preliminary result)   Collection Time: 07/07/15  2:35 PM  Result Value Ref Range Status   Specimen Description BLOOD LEFT ANTECUBITAL  Final   Special Requests BOTTLES DRAWN AEROBIC ONLY  5CC  Final   Culture NO GROWTH 3 DAYS  Final   Report Status PENDING  Incomplete  Culture, blood (routine x 2)     Status: None (Preliminary result)   Collection Time: 07/07/15  3:00 PM  Result Value Ref Range Status   Specimen Description BLOOD LEFT HAND  Final   Special Requests IN PEDIATRIC BOTTLE 1CC  Final   Culture NO GROWTH 3 DAYS  Final   Report Status PENDING  Incomplete    Coagulation Studies: No results for input(s): LABPROT, INR in the last 72 hours.  Urinalysis: No results for input(s): COLORURINE, LABSPEC, PHURINE, GLUCOSEU, HGBUR, BILIRUBINUR, KETONESUR, PROTEINUR, UROBILINOGEN, NITRITE, LEUKOCYTESUR in the last 72 hours.  Invalid input(s): APPERANCEUR    Imaging: No results found.   Medications:     . sodium chloride   Intravenous Once  . sodium chloride   Intravenous Once  . sodium chloride   Intravenous Once  . sodium chloride    Intravenous Once  . amLODipine  10 mg Oral Daily  . aspirin EC  81 mg Oral Daily  . benzonatate  200 mg Oral TID  . calcium acetate  1,334 mg  Oral TID WC  .  ceFAZolin (ANCEF) IV  2 g Intravenous Q M,[name],F-HD  . chlorpheniramine-HYDROcodone  5 mL Oral Q12H  . darbepoetin (ARANESP) injection - DIALYSIS  150 mcg Intravenous Q Mon-HD  . doxercalciferol  2.5 mcg Intravenous Q M,[name],F-HD  . guaiFENesin  600 mg Oral BID  . heparin subcutaneous  5,000 Units Subcutaneous 3 times per day  . HYDROcodone-acetaminophen  1 tablet Oral QHS  . insulin aspart  0-5 Units Subcutaneous QHS  . insulin aspart  0-9 Units Subcutaneous TID WC  . insulin aspart  4 Units Subcutaneous TID WC  . insulin glargine  10 Units Subcutaneous QHS  . multivitamin  1 tablet Oral QHS  . pneumococcal 23 valent vaccine  0.5 mL Intramuscular Tomorrow-1000  . polyethylene glycol  17 g Oral Daily  . senna-docusate  2 tablet Oral QHS   sodium chloride, sodium chloride, alteplase, heparin, ipratropium-albuterol, lidocaine (PF), lidocaine-prilocaine, pentafluoroprop-tetrafluoroeth  Assessment/ Plan:   ESRD- MWF dialysis [place]- work up per hematology  MBD-stable  HTN/VOL- controlled  ACCESS-  Fistula  Staph lugdenseis bacteriemia   Ancef MWF    LOS: 8 Seth [name],Seth [name] @TODAY @1 :44 PM

## 2015-07-11 NOTE — Progress Notes (Signed)
[name], male   DOB: 08-01-1966, 49 y.o.   MRN: [id number] Medicine attending: Afebrile day 6 IV antibiotics for penicillin sensitive staph sepsis. Of note, he never had a fever. He continues to require intermittent blood transfusions. There has been a progressive fall in haptoglobin and rise in LDH and, until today, bilirubin to a  peake value of 1.6 consistent with a hemolytic process. Urine hemosiderin is negative arguing against a chronic hemolytic process. As noted, acute titers against EBV and mycoplasma were negative. Staph sepsis would be unusual reason for hemolysis unless there was concomitant DIC. He has mild elevation of PT and PTT and significant elevation of D-dimer but fibrinogen is elevated not decreased. Cold agglutinin titer has returned significantly elevated at 1:2048 drawn on September 27 with titer of 1:512 drawn the next day on September 28. 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 We will continue to monitor his clinical and laboratory status. Repeat coagulation profile, LDH.

## 2015-07-11 NOTE — Progress Notes (Signed)
Subjective: Seth [name]. Patient endorses chest soreness from continued cough.  He has no other complaints.  He is doing well s/p 1 U pRBCs transfused overnight.  He denies fever, chills, chest pain, or SOB.  Objective: Vital signs in last 24 hours: Filed Vitals:   07/11/15 0412 07/11/15 0445 07/11/15 0505 07/11/15 0605  BP: 135/86 168/88 132/73 129/84  Pulse: 93 98 92 91  Temp: 97.7 F (36.5 C) 97.7 F (36.5 C) 97.5 F (36.4 C) 98.6 F (37 C)  TempSrc:  Axillary Axillary Axillary  Resp: 24 20 22 22   Height:      Weight:      SpO2: 99% 98% 98% 100%   Weight change:   Intake/Output Summary (Last 24 hours) at 07/11/15 0939 Last data filed at 07/11/15 0505  Gross per 24 hour  Intake    520 ml  Output   1705 ml  Net  -1185 ml   Physical Exam  Constitutional: He is oriented to person, place, and time and well-developed, well-nourished, and in no distress. No distress.  Morbidly obese, sitting upright in chair.  Pleasant, in NAD  HENT:  Head: Normocephalic and atraumatic.  Eyes: EOM are normal. No scleral icterus.  Neck: No JVD present. No tracheal deviation present.  Cardiovascular: Normal rate, regular rhythm and normal heart sounds.   Heart sounds distant; no adventitious sounds appreciated  Pulmonary/Chest: Effort normal. No respiratory distress.  Moderate scattered wheezes throughout.  Abdominal: Soft. He exhibits no distension. There is no tenderness. There is no rebound and no guarding.  Musculoskeletal: He exhibits no edema.  Neurological: He is alert and oriented to person, place, and time.  Skin: Skin is warm and dry. No rash noted. He is not diaphoretic.    Lab Results: Basic Metabolic Panel:  Recent Labs Lab 07/06/15 0709  07/08/15 0837 07/09/15 0750 07/10/15 1514  NA 126*  < > 125* 128* 127*  K 4.0  < > 3.9 5.0 3.9  CL 86*  < > 87* 90* 88*  CO2 22  < > 20* 23 24  GLUCOSE 286*  < > 289* 239* 219*  BUN 99*  < > 88* 62* 80*  CREATININE 10.11*  < > 9.84*  8.01* 11.19*  CALCIUM 9.2  < > 8.7* 9.2 8.7*  PHOS 7.9*  --  7.2*  --   --   < > = values in this interval not displayed. Liver Function Tests:  Recent Labs Lab 07/05/15 0453  07/06/15 1235 07/08/15 0837 07/10/15 1514  AST 26  --  21  --   --   ALT 22  --  20  --   --   ALKPHOS 97  --  98  --   --   BILITOT 1.3*  --  1.6*  --  1.3*  PROT 9.2*  --  9.0*  --   --   ALBUMIN 3.2*  < > 3.3* 3.3*  --   < > = values in this interval not displayed. No results for input(s): LIPASE, AMYLASE in the last 168 hours. No results for input(s): AMMONIA in the last 168 hours. CBC:  Recent Labs Lab 07/09/15 0750 07/10/15 1514 07/10/15 2340  WBC 15.0* 11.1* 8.9  NEUTROABS 11.2* 8.2*  --   HGB 8.5* 7.0* 7.0*  HCT 23.8* 19.6* 19.2*  MCV 91.2 92.0 93.2  PLT 385 414* 387   Cardiac Enzymes: No results for input(s): CKTOTAL, CKMB, CKMBINDEX, TROPONINI in the last 168 hours. BNP: No results for  input(s): PROBNP in the last 168 hours. D-Dimer:  Recent Labs Lab 07/07/15 0846  DDIMER 4.14*   CBG:  Recent Labs Lab 07/09/15 2046 07/10/15 0757 07/10/15 1151 07/10/15 1954 07/10/15 2101 07/11/15 0736  GLUCAP 220* 204* 191* 209* 271* 176*   Hemoglobin A1C: No results for input(s): HGBA1C in the last 168 hours. Fasting Lipid Panel: No results for input(s): CHOL, HDL, LDLCALC, TRIG, CHOLHDL, LDLDIRECT in the last 168 hours. Thyroid Function Tests: No results for input(s): TSH, T4TOTAL, FREET4, T3FREE, THYROIDAB in the last 168 hours. Coagulation:  Recent Labs Lab 07/07/15 0846  LABPROT 16.4*  INR 1.30   Anemia Panel:  Recent Labs Lab 07/04/15 1800 07/06/15 1130  TIBC 221*  --   IRON 193*  --   RETICCTPCT  --  5.3*   Urine Drug Screen: Drugs of Abuse  No results found for: LABOPIA, COCAINSCRNUR, LABBENZ, AMPHETMU, THCU, LABBARB  Alcohol Level: No results for input(s): ETH in the last 168 hours. Urinalysis: No results for input(s): COLORURINE, LABSPEC, PHURINE,  GLUCOSEU, HGBUR, BILIRUBINUR, KETONESUR, PROTEINUR, UROBILINOGEN, NITRITE, LEUKOCYTESUR in the last 168 hours.  Invalid input(s): APPERANCEUR Misc. Labs:   Micro Results: Recent Results (from the past 240 hour(s))  MRSA PCR Screening     Status: None   Collection Time: 07/03/15  7:59 PM  Result Value Ref Range Status   MRSA by PCR NEGATIVE NEGATIVE Final    Comment:        The GeneXpert MRSA Assay (FDA approved for NASAL specimens only), is one component of a comprehensive MRSA colonization surveillance program. It is not intended to diagnose MRSA infection nor to guide or monitor treatment for MRSA infections.   Culture, blood (routine x 2)     Status: None   Collection Time: 07/04/15  1:30 PM  Result Value Ref Range Status   Specimen Description BLOOD DIALYSIS  Final   Special Requests BOTTLES DRAWN AEROBIC AND ANAEROBIC 10CC  Final   Culture  Setup Time   Final    GRAM POSITIVE COCCI IN CLUSTERS AEROBIC BOTTLE ONLY CRITICAL RESULT CALLED TO, READ BACK BY AND VERIFIED WITH: A MINTZ 07/05/15 @ [place]  Final   Report Status 07/07/2015 FINAL  Final   Organism ID, Bacteria STAPHYLOCOCCUS LUGDUNENSIS  Final      Susceptibility   Staphylococcus lugdunensis - MIC*    CIPROFLOXACIN <=0.5 SENSITIVE Sensitive     ERYTHROMYCIN <=0.25 SENSITIVE Sensitive     GENTAMICIN <=0.5 SENSITIVE Sensitive     OXACILLIN 0.5 SENSITIVE Sensitive     TETRACYCLINE <=1 SENSITIVE Sensitive     VANCOMYCIN <=0.5 SENSITIVE Sensitive     TRIMETH/SULFA <=10 SENSITIVE Sensitive     CLINDAMYCIN <=0.25 SENSITIVE Sensitive     RIFAMPIN <=0.5 SENSITIVE Sensitive     Inducible Clindamycin NEGATIVE Sensitive     * STAPHYLOCOCCUS LUGDUNENSIS  Culture, blood (routine x 2)     Status: None   Collection Time: 07/04/15  1:45 PM  Result Value Ref Range Status   Specimen Description BLOOD DIALYSIS  Final   Special Requests BOTTLES DRAWN AEROBIC AND ANAEROBIC 10CC   Final   Culture  Setup Time   Final    GRAM POSITIVE COCCI IN CLUSTERS AEROBIC BOTTLE ONLY CONFIRMED BY V WILKINS CRITICAL RESULT CALLED TO, READ BACK BY AND VERIFIED WITH: K HAYWOOD 07/06/15 @1114  M VESTAL    Culture   Final    STAPHYLOCOCCUS LUGDUNENSIS SUSCEPTIBILITIES PERFORMED ON PREVIOUS CULTURE WITHIN THE  LAST 5 DAYS.    Report Status 07/07/2015 FINAL  Final  Culture, blood (routine x 2)     Status: None (Preliminary result)   Collection Time: 07/07/15  2:35 PM  Result Value Ref Range Status   Specimen Description BLOOD LEFT ANTECUBITAL  Final   Special Requests BOTTLES DRAWN AEROBIC ONLY  5CC  Final   Culture NO GROWTH 3 DAYS  Final   Report Status PENDING  Incomplete  Culture, blood (routine x 2)     Status: None (Preliminary result)   Collection Time: 07/07/15  3:00 PM  Result Value Ref Range Status   Specimen Description BLOOD LEFT HAND  Final   Special Requests IN PEDIATRIC BOTTLE 1CC  Final   Culture NO GROWTH 3 DAYS  Final   Report Status PENDING  Incomplete   Studies/Results: Dg Chest 2 View  07/09/2015   CLINICAL DATA:  Cough, shortness of breath, hypertension, diabetes mellitus, sleep apnea  EXAM: CHEST  2 VIEW  COMPARISON:  07/03/2015  FINDINGS: Mild enlargement of cardiac silhouette.  Mediastinal contours and pulmonary vascularity normal.  Subsegmental atelectasis at RIGHT lung base versus cysts.  No acute infiltrate, pleural effusion, or pneumothorax.  Chronic compression deformity of the anterior aspect of an upper thoracic vertebral body appears unchanged  IMPRESSION: Persistent RIGHT basilar atelectasis.  Mild enlargement of cardiac silhouette.   Electronically Signed   By: Lavonia Dana M.D.   On: 07/09/2015 12:59   Medications: I have reviewed the patient's current medications. Scheduled Meds: . sodium chloride   Intravenous Once  . sodium chloride   Intravenous Once  . sodium chloride   Intravenous Once  . sodium chloride   Intravenous Once  . amLODipine   10 mg Oral Daily  . aspirin EC  81 mg Oral Daily  . benzonatate  200 mg Oral TID  . calcium acetate  1,334 mg Oral TID WC  .  ceFAZolin (ANCEF) IV  2 g Intravenous Q M,W,F-HD  . chlorpheniramine-HYDROcodone  5 mL Oral Q12H  . darbepoetin (ARANESP) injection - DIALYSIS  150 mcg Intravenous Q Mon-HD  . doxercalciferol  2.5 mcg Intravenous Q M,W,F-HD  . guaiFENesin  600 mg Oral BID  . heparin subcutaneous  5,000 Units Subcutaneous 3 times per day  . HYDROcodone-acetaminophen  1 tablet Oral QHS  . insulin aspart  0-5 Units Subcutaneous QHS  . insulin aspart  0-9 Units Subcutaneous TID WC  . insulin aspart  4 Units Subcutaneous TID WC  . insulin glargine  10 Units Subcutaneous QHS  . multivitamin  1 tablet Oral QHS  . pneumococcal 23 valent vaccine  0.5 mL Intramuscular Tomorrow-1000  . polyethylene glycol  17 g Oral Daily  . 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 male with past medical history of DM type II, HTN, ESRD with MWF HD, and OSA who presents to the ED with 2.5 weeks of feeling bad with what he describes as cold like symptoms, runny nose.  Staph Lugdunesis bacteremia, improving -WBC at admission of 18.4 with 78%  PMNs trending down to 8.9 yesterday. -Blood cultures 2/2 positive for GPC in clusters, aerobic bottles >> Staph Lugdunensis (pan sensitive) -Will treat for 14 days with Cefazolin 2g every MWF w/ HD. Using 9/27(repeat blood cx negative at this  time) as day 1 (9/27-10/11) -Orthopantogram: poor dentition with multiple dental caries and multiple missing and upper lower teeth. Degenerative changes of the left TMJ. Recommend dental follow up -TEE today w/ EF 60%, no evidence of thrombus or vegetation  Acute worsening on Chronic Anemia. Chronic anemia 2/2 CKD.  Hgb 7.0 on 9/30; LDH high, indirect Bilirubin high, haptoglobin low, c/w hemolysis.  Retic percentage elevated.  Cold agglutinin titers 1:512 on 9/28 (1:2048 on 9/27). Etiology remains unclear at this time. -Hgb 7.5 on admission, down from 11.3 on 9/14.Now s/p 5 U pRBCs. -Consult hematology on 9/26, appreciate recommendations.   - unlikely this is a delayed transfusion reaction.  - EBV and Mycoplasma IgM and IgG negative, possibly benign   - will use warmer during transfusion to prevent cold-agglutanins  - continue to monitor CBC closely and transfuse as necessary -monitor CBC closely and transfuse as necessary Hgb < 7.0. [ ]  HCV Ab  Cough, likely viral bronchitis. - S/p Azithromycin x5 days outpatient -chest x-ray on admission compared to 9/14 shows improved right bibasilar atelectasis. Repeat CXR yesterday shows persistent right basilar atelectasis. -incentive spirometry -strep pneumo negative, legionella negative, mycoplasma negative - continue with supportive care with Tussionex q12h, Mucinex 600mg  PO BID, Tessalon 200mg  TID, Duonebs q4h prn and Norco qhs  Diabetes mellitus -sliding scale insulin, Lantus 10 units qhs, Novolog 4units TID with meals -Hgb A1c 8.0  ESRD on M,W,F HD at [place] nephrology recs. - HD today inpatient, will continue MWF   Hypertension -Continue amlodipine.   Hyponatremia  -likely due to hyperglycemia combined with possible increased volume from renal disease -continue to monitor  Hyperkalemia - improved with kayex + HD -potassium was found to be 6.6 initially. Improved  with kayex and also HD.   OSA -CPAP nightly   Dispo: Disposition is deferred at this time, awaiting improvement of current medical problems.   The patient does have a current PCP (Tresa Garter, MD) and does not need an [facility] hospital follow-up appointment after discharge.  The patient does not have transportation limitations that hinder transportation to clinic appointments.    LOS: 8 days   Iline Oven, MD 07/11/2015, 9:39 AM

## 2015-07-11 NOTE — Progress Notes (Signed)
Pt had BP of 162/118. Pt  asymptomatic. MD paged.  Spoke with MD regarding BP. No new orders at this time. Will continue to assess and monitor pt.   Carole Civil, RN

## 2015-07-11 NOTE — Evaluation (Signed)
Physical Therapy Evaluation Patient Details Name: Seth [name] MRN: [id number] DOB: 1965/11/09 Today's Date: 07/11/2015   History of Present Illness  Patient is a 49 yo male admitted 07/03/15 with cough, SOB.  Patient with bacteremia (staph lugdunensis), anemia, hyperkalemia.    PMH:  DM type II, HTN, ESRD with MWF HD, anemia, morbid obesity, and OSA   Clinical Impression  Patient presents with problems listed below.  Will benefit from acute PT to maximize functional mobility prior to return home with family.  Patient with LE weakness impacting gait, balance, and safety.  Will try bari RW at next session.    Follow Up Recommendations Home health PT;Supervision for mobility/OOB    Equipment Recommendations  Rolling walker with 5" wheels;3in1 (PT) (Bariatric equipment)    Recommendations for Other Services       Precautions / Restrictions Precautions Precautions: Fall Restrictions Weight Bearing Restrictions: No      Mobility  Bed Mobility               General bed mobility comments: Patient sitting in bari recliner  Transfers Overall transfer level: Needs assistance Equipment used: 1 person hand held assist Transfers: Sit to/from Stand Sit to Stand: Min assist         General transfer comment: Assist to steady during transfer to standing.  Patient unsteady in stance initially.  Poor control during descent into chair following ambulation.  Ambulation/Gait Ambulation/Gait assistance: Min assist Ambulation Distance (Feet): 24 Feet Assistive device: None (Patient reaching for furniture in room for support) Gait Pattern/deviations: Step-through pattern;Decreased step length - right;Decreased step length - left;Decreased stride length;Shuffle;Staggering left;Staggering right;Trunk flexed Gait velocity: decreased Gait velocity interpretation: Below normal speed for age/gender General Gait Details: Patient with very unsteady gait, reaching for furniture in room for  support.  Patient reports "legs feel like noodles".  Ambulated 12' and required standing rest, leaning on sink in room.  Then able to ambulate 12' back to chair.    Stairs            Wheelchair Mobility    Modified Rankin (Stroke Patients Only)       Balance Overall balance assessment: Needs assistance         Standing balance support: Single extremity supported Standing balance-Leahy Scale: Poor Standing balance comment: Requires UE support to maintain balance.                             Pertinent Vitals/Pain Pain Assessment: 0-10 Pain Score: 10-Worst pain ever Pain Location: Back and Lt shoulder Pain Descriptors / Indicators: Aching;Sore Pain Intervention(s): Monitored during session;Repositioned    Home Living Family/patient expects to be discharged to:: Private residence Living Arrangements: Spouse/significant other;Children Available Help at Discharge: Family;Available 24 hours/day (Per patient, fiance' available 24*) Type of Home: House Home Access: Stairs to enter Entrance Stairs-Rails: None Entrance Stairs-Number of Steps: 3 Home Layout: One level        Prior Function Level of Independence: Independent               Hand Dominance        Extremity/Trunk Assessment   Upper Extremity Assessment: Generalized weakness           Lower Extremity Assessment: Generalized weakness         Communication   Communication: No difficulties  Cognition Arousal/Alertness: Awake/alert Behavior During Therapy: WFL for tasks assessed/performed Overall Cognitive Status: Within Functional Limits for tasks assessed  General Comments      Exercises        Assessment/Plan    PT Assessment Patient needs continued PT services  PT Diagnosis Difficulty walking;Generalized weakness;Acute pain   PT Problem List Decreased strength;Decreased activity tolerance;Decreased balance;Decreased mobility;Decreased  knowledge of use of DME;Cardiopulmonary status limiting activity;Obesity;Pain  PT Treatment Interventions DME instruction;Gait training;Functional mobility training;Therapeutic activities;Therapeutic exercise;Patient/family education   PT Goals (Current goals can be found in the Care Plan section) Acute Rehab PT Goals Patient Stated Goal: To get stronger PT Goal Formulation: With patient Time For Goal Achievement: 07/18/15 Potential to Achieve Goals: Good    Frequency Min 3X/week   Barriers to discharge        Co-evaluation               End of Session Equipment Utilized During Treatment: Gait belt Activity Tolerance: Patient limited by fatigue;Patient limited by pain Patient left: in chair;with call bell/phone within reach;with family/visitor present Nurse Communication: Mobility status;Patient requests pain meds (Poor balance)         Time: 1700-1711 PT Time Calculation (min) (ACUTE ONLY): 11 min   Charges:   PT Evaluation $Initial PT Evaluation Tier I: 1 Procedure     PT G CodesDespina Pole August 07, 2015, 7:34 PM Carita Pian. Sanjuana Kava, [place] Pager [phone]

## 2015-07-12 DIAGNOSIS — E1122 Type 2 diabetes mellitus with diabetic chronic kidney disease: Secondary | ICD-10-CM

## 2015-07-12 LAB — PROTIME-INR
INR: 1.25 (ref 0.00–1.49)
Prothrombin Time: 15.9 seconds — ABNORMAL HIGH (ref 11.6–15.2)

## 2015-07-12 LAB — TYPE AND SCREEN
ABO/RH(D): B POS
ANTIBODY SCREEN: NEGATIVE
DAT, IgG: NEGATIVE
Unit division: 0
Unit division: 0

## 2015-07-12 LAB — CULTURE, BLOOD (ROUTINE X 2)
CULTURE: NO GROWTH
Culture: NO GROWTH

## 2015-07-12 LAB — APTT: aPTT: 51 seconds — ABNORMAL HIGH (ref 24–37)

## 2015-07-12 LAB — BASIC METABOLIC PANEL
Anion gap: 14 (ref 5–15)
BUN: 59 mg/dL — AB (ref 6–20)
CHLORIDE: 91 mmol/L — AB (ref 101–111)
CO2: 25 mmol/L (ref 22–32)
CREATININE: 8.93 mg/dL — AB (ref 0.61–1.24)
Calcium: 8.8 mg/dL — ABNORMAL LOW (ref 8.9–10.3)
GFR calc Af Amer: 7 mL/min — ABNORMAL LOW (ref >60)
GFR, EST NON AFRICAN AMERICAN: 6 mL/min — AB (ref >60)
Glucose, Bld: 154 mg/dL — ABNORMAL HIGH (ref 65–99)
Potassium: 4.3 mmol/L (ref 3.5–5.1)
Sodium: 130 mmol/L — ABNORMAL LOW (ref 135–145)

## 2015-07-12 LAB — HEPATITIS C ANTIBODY: HCV Ab: <0.1 s/co ratio (ref 0.0–0.9)

## 2015-07-12 LAB — GLUCOSE, CAPILLARY
GLUCOSE-CAPILLARY: 124 mg/dL — AB (ref 65–99)
Glucose-Capillary: 158 mg/dL — ABNORMAL HIGH (ref 65–99)
Glucose-Capillary: 188 mg/dL — ABNORMAL HIGH (ref 65–99)
Glucose-Capillary: 164 mg/dL — ABNORMAL HIGH (ref 65–99)

## 2015-07-12 LAB — LACTATE DEHYDROGENASE: LDH: 301 U/L — ABNORMAL HIGH (ref 98–192)

## 2015-07-12 LAB — SAVE SMEAR

## 2015-07-12 MED ORDER — AMLODIPINE BESYLATE 10 MG PO TABS: 10.0000 mg | ORAL_TABLET | Freq: Every day | ORAL | Status: DC

## 2015-07-12 MED ORDER — AMLODIPINE BESYLATE 5 MG PO TABS: 5.0000 mg | ORAL_TABLET | Freq: Every day | ORAL | Status: DC

## 2015-07-12 NOTE — Progress Notes (Signed)
Physical Therapy Treatment Patient Details Name: Seth [name] MRN: [id number] DOB: [birth year] Today's Date: 07/12/2015    History of Present Illness Patient is a 49 yo male admitted 07/03/15 with cough, SOB.  Patient with bacteremia (staph lugdunensis), anemia, hyperkalemia.    PMH:  DM type II, HTN, ESRD with MWF HD, anemia, morbid obesity, and OSA     PT Comments    Encouragement to work on getting up and walking, but pt seemed happy that he pushed himself once walking was done; he became somewhat tearful with me, talking about how frustrating it is to feel like he has lost independence  Agreeable to meet with a Chaplain   Follow Up Recommendations  Home health PT;Supervision for mobility/OOB     Equipment Recommendations  Rolling walker with 5" wheels;3in1 (PT) (Bariatric equipment)    Recommendations for Other Services OT consult     Precautions / Restrictions Precautions Precautions: Fall Restrictions Weight Bearing Restrictions: No    Mobility  Bed Mobility               General bed mobility comments: Patient sitting in bari recliner  Transfers Overall transfer level: Needs assistance Equipment used: Rolling walker (2 wheeled) (Bariatric) Transfers: Sit to/from Stand Sit to Stand: Min assist         General transfer comment: Assist to steady during transfer to standing.  Cues for hand placement and safety and assist to steady RW; Patient unsteady in stance initially.  Poor control during descent into chair following ambulation.  Ambulation/Gait Ambulation/Gait assistance: Min assist;+2 safety/equipment (chair pushed behind) Ambulation Distance (Feet): 40 Feet Assistive device: Rolling walker (2 wheeled) Gait Pattern/deviations: Step-through pattern;Decreased stride length;Trunk flexed Gait velocity: decreased   General Gait Details: Continued reports of significant fatigue in LEs; Pt able to push himself more today -- boosted his confidence by  having a recliner pushed behind him "just in case" he needed to sit quickly (which, thankfully, he did not); very fatigued post amb   Stairs            Wheelchair Mobility    Modified Rankin (Stroke Patients Only)       Balance             Standing balance-Leahy Scale: Poor Standing balance comment: Requires UE support to maintain balance.                    Cognition Arousal/Alertness: Awake/alert Behavior During Therapy: WFL for tasks assessed/performed Overall Cognitive Status: Within Functional Limits for tasks assessed                      Exercises      General Comments        Pertinent Vitals/Pain Pain Assessment: Faces Faces Pain Scale: Hurts little more Pain Location: Back pain Pain Descriptors / Indicators: Aching Pain Intervention(s): Limited activity within patient's tolerance;Monitored during session;Repositioned    Home Living                      Prior Function            PT Goals (current goals can now be found in the care plan section) Acute Rehab PT Goals Patient Stated Goal: To get stronger PT Goal Formulation: With patient Time For Goal Achievement: 07/18/15 Potential to Achieve Goals: Good Progress towards PT goals: Progressing toward goals    Frequency  Min 3X/week    PT Plan Current plan remains appropriate  Co-evaluation             End of Session Equipment Utilized During Treatment: Gait belt Activity Tolerance: Patient limited by fatigue;Patient limited by pain Patient left: in chair;with call bell/phone within reach     Time: 1145-1209 PT Time Calculation (min) (ACUTE ONLY): 24 min  Charges:  $Gait Training: 8-22 mins $Therapeutic Activity: 8-22 mins                    G Codes:      Quin Hoop 07/12/2015, 1:54 PM  Roney Marion, [place] Pager [phone] Office [phone]

## 2015-07-12 NOTE — Progress Notes (Signed)
Pt states he can place CPAP on himself when ready. H2O added. I told him to call for further assistance.

## 2015-07-12 NOTE — Progress Notes (Signed)
[name], male   DOB: 1966-09-10, 49 y.o.   MRN: [id number] Medicine attending: I examined this patient this morning together with resident physician Dr. Viviano Simas and I concur with his evaluation and management plan which we discussed together. LDH is trending down. PT and PTT both remain mildly elevated and I wonder whether the cold agglutinins are causing interference with the lab tests? We reviewed the peripheral blood film from this morning. There is no longer any rouleaux. 1+ polychromasia. 1+ spherocytes. Rare red cell fragment but more than adequate platelets. Mature neutrophils and lymphocytes. No Red cell inclusions. No basophilic stippling. We are asking the laboratory to repeat direct and indirect Coombs testing and also to check for presence of an anti-P(Donath-Landsteiner) antibody to rule out [facility] [place] although this is very rare. He continues on cefazolin for penicillin sensitive Staphylococcus species. Negative TEE. Anticipate 14 day course. He continues on Monday Wednesday Friday dialysis for end-stage renal disease.

## 2015-07-12 NOTE — Progress Notes (Signed)
[facility] ROUNDING NOTE   Subjective:   Interval History: no complaints today  Objective:  Vital signs in last 24 hours:  Temp:  [97.7 F (36.5 C)-98.3 F (36.8 C)] 98.3 F (36.8 C) (10/02 0929) Pulse Rate:  [88-93] 91 (10/02 0929) Resp:  [20-22] 22 (10/02 0929) BP: (149-164)/(83-118) 164/97 mmHg (10/02 0929) SpO2:  [97 %-100 %] 100 % (10/02 0929)  Weight change:  Filed Weights   07/08/15 2041 07/10/15 1437 07/10/15 1925  Weight: 143.79 kg (317 lb) 144.8 kg (319 lb 3.6 oz) 143.3 kg (315 lb 14.7 oz)    Intake/Output: I/O last 3 completed shifts: In: 23 [P.O.:600; Blood:280] Out: [id number] [Other:1705]   Intake/Output this shift:     CVS- RRR RS- CTA ABD- BS present soft non-distended EXT- no edema   Basic Metabolic Panel:  Recent Labs Lab 07/06/15 0709  07/08/15 0837 07/09/15 0750 07/10/15 1514 07/11/15 1051 07/12/15 0345  NA 126*  < > 125* 128* 127* 131* 130*  K 4.0  < > 3.9 5.0 3.9 4.2 4.3  CL 86*  < > 87* 90* 88* 91* 91*  CO2 22  < > 20* 23 24 27 25   GLUCOSE 286*  < > 289* 239* 219* 183* 154*  BUN 99*  < > 88* 62* 80* 45* 59*  CREATININE 10.11*  < > 9.84* 8.01* 11.19* 7.35* 8.93*  CALCIUM 9.2  < > 8.7* 9.2 8.7* 9.1 8.8*  PHOS 7.9*  --  7.2*  --   --   --   --   < > = values in this interval not displayed.  Liver Function Tests:  Recent Labs Lab 07/06/15 0709 07/06/15 1235 07/08/15 0837 07/10/15 1514  AST  --  21  --   --   ALT  --  20  --   --   ALKPHOS  --  98  --   --   BILITOT  --  1.6*  --  1.3*  PROT  --  9.0*  --   --   ALBUMIN 3.3* 3.3* 3.3*  --    No results for input(s): LIPASE, AMYLASE in the last 168 hours. No results for input(s): AMMONIA in the last 168 hours.  CBC:  Recent Labs Lab 07/07/15 0846 07/08/15 0720 07/09/15 0750 07/10/15 1514 07/10/15 2340 07/11/15 1051  WBC 17.3* 15.5* 15.0* 11.1* 8.9 11.1*  NEUTROABS 12.9* 11.5* 11.2* 8.2*  --  8.3*  HGB 7.4* 6.5* 8.5* 7.0* 7.0* 7.8*  HCT 21.9* 19.2*  23.8* 19.6* 19.2* 23.0*  MCV 89.8 90.6 91.2 92.0 93.2 94.3  PLT 454* 439* 385 414* 387 384    Cardiac Enzymes: No results for input(s): CKTOTAL, CKMB, CKMBINDEX, TROPONINI in the last 168 hours.  BNP: Invalid input(s): POCBNP  CBG:  Recent Labs Lab 07/11/15 0736 07/11/15 1133 07/11/15 1659 07/11/15 2127 07/12/15 0741  GLUCAP 176* 188* 219* 210* 158*    Microbiology: Results for orders placed or performed during the hospital encounter of 07/03/15  MRSA PCR Screening     Status: None   Collection Time: 07/03/15  7:59 PM  Result Value Ref Range Status   MRSA by PCR NEGATIVE NEGATIVE Final    Comment:        The GeneXpert MRSA Assay (FDA approved for NASAL specimens only), is one component of a comprehensive MRSA colonization surveillance program. It is not intended to diagnose MRSA infection nor to guide or monitor treatment for MRSA infections.   Culture, blood (routine x 2)  Status: None   Collection Time: 07/04/15  1:30 PM  Result Value Ref Range Status   Specimen Description BLOOD DIALYSIS  Final   Special Requests BOTTLES DRAWN AEROBIC AND ANAEROBIC 10CC  Final   Culture  Setup Time   Final    GRAM POSITIVE COCCI IN CLUSTERS AEROBIC BOTTLE ONLY CRITICAL RESULT CALLED TO, READ BACK BY AND VERIFIED WITH: A MINTZ 07/05/15 @ [place]  Final   Report Status 07/07/2015 FINAL  Final   Organism ID, Bacteria STAPHYLOCOCCUS LUGDUNENSIS  Final      Susceptibility   Staphylococcus lugdunensis - MIC*    CIPROFLOXACIN <=0.5 SENSITIVE Sensitive     ERYTHROMYCIN <=0.25 SENSITIVE Sensitive     GENTAMICIN <=0.5 SENSITIVE Sensitive     OXACILLIN 0.5 SENSITIVE Sensitive     TETRACYCLINE <=1 SENSITIVE Sensitive     VANCOMYCIN <=0.5 SENSITIVE Sensitive     TRIMETH/SULFA <=10 SENSITIVE Sensitive     CLINDAMYCIN <=0.25 SENSITIVE Sensitive     RIFAMPIN <=0.5 SENSITIVE Sensitive     Inducible Clindamycin NEGATIVE Sensitive     *  STAPHYLOCOCCUS LUGDUNENSIS  Culture, blood (routine x 2)     Status: None   Collection Time: 07/04/15  1:45 PM  Result Value Ref Range Status   Specimen Description BLOOD DIALYSIS  Final   Special Requests BOTTLES DRAWN AEROBIC AND ANAEROBIC 10CC  Final   Culture  Setup Time   Final    GRAM POSITIVE COCCI IN CLUSTERS AEROBIC BOTTLE ONLY CONFIRMED BY V WILKINS CRITICAL RESULT CALLED TO, READ BACK BY AND VERIFIED WITH: K HAYWOOD 07/06/15 @1114  M VESTAL    Culture   Final    STAPHYLOCOCCUS LUGDUNENSIS SUSCEPTIBILITIES PERFORMED ON PREVIOUS CULTURE WITHIN THE LAST 5 DAYS.    Report Status 07/07/2015 FINAL  Final  Culture, blood (routine x 2)     Status: None (Preliminary result)   Collection Time: 07/07/15  2:35 PM  Result Value Ref Range Status   Specimen Description BLOOD LEFT ANTECUBITAL  Final   Special Requests BOTTLES DRAWN AEROBIC ONLY  5CC  Final   Culture NO GROWTH 4 DAYS  Final   Report Status PENDING  Incomplete  Culture, blood (routine x 2)     Status: None (Preliminary result)   Collection Time: 07/07/15  3:00 PM  Result Value Ref Range Status   Specimen Description BLOOD LEFT HAND  Final   Special Requests IN PEDIATRIC BOTTLE 1CC  Final   Culture NO GROWTH 4 DAYS  Final   Report Status PENDING  Incomplete    Coagulation Studies:  Recent Labs  07/12/15 0345  LABPROT 15.9*  INR 1.25    Urinalysis: No results for input(s): COLORURINE, LABSPEC, PHURINE, GLUCOSEU, HGBUR, BILIRUBINUR, KETONESUR, PROTEINUR, UROBILINOGEN, NITRITE, LEUKOCYTESUR in the last 72 hours.  Invalid input(s): APPERANCEUR    Imaging: No results found.   Medications:     . sodium chloride   Intravenous Once  . sodium chloride   Intravenous Once  . sodium chloride   Intravenous Once  . sodium chloride   Intravenous Once  . [START ON 07/13/2015] amLODipine  5 mg Oral Daily  . aspirin EC  81 mg Oral Daily  . benzonatate  200 mg Oral TID  . calcium acetate  1,334 mg Oral TID WC  .   ceFAZolin (ANCEF) IV  2 g Intravenous Q M,[name],F-HD  . chlorpheniramine-HYDROcodone  5 mL Oral Q12H  . darbepoetin (ARANESP) injection - DIALYSIS  150  mcg Intravenous Q Mon-HD  . doxercalciferol  2.5 mcg Intravenous Q M,[name],F-HD  . guaiFENesin  600 mg Oral BID  . heparin subcutaneous  5,000 Units Subcutaneous 3 times per day  . HYDROcodone-acetaminophen  1 tablet Oral QHS  . insulin aspart  0-5 Units Subcutaneous QHS  . insulin aspart  0-9 Units Subcutaneous TID WC  . insulin aspart  4 Units Subcutaneous TID WC  . insulin glargine  10 Units Subcutaneous QHS  . multivitamin  1 tablet Oral QHS  . pneumococcal 23 valent vaccine  0.5 mL Intramuscular Tomorrow-1000  . polyethylene glycol  17 g Oral Daily  . 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

## 2015-07-12 NOTE — Progress Notes (Signed)
Subjective: NAEON. Patient endorses chest soreness, though his cough has improved.  He denies fever, chills, chest pain, or SOB.  He has no other complaints.  Objective: Vital signs in last 24 hours: Filed Vitals:   07/11/15 2100 07/12/15 0500 07/12/15 0807 07/12/15 0929  BP: 149/84 150/88 160/94 164/97  Pulse: 91 88 89 91  Temp: 98.2 F (36.8 C) 97.8 F (36.6 C)  98.3 F (36.8 C)  TempSrc: Oral Oral  Oral  Resp: 20 20  22   Height:      Weight:      SpO2: 99% 97%  100%   Weight change:   Intake/Output Summary (Last 24 hours) at 07/12/15 1058 Last data filed at 07/11/15 2200  Gross per 24 hour  Intake    360 ml  Output      0 ml  Net    360 ml   Physical Exam  Constitutional: He is oriented to person, place, and time and well-developed, well-nourished, and in no distress. No distress.  Morbidly obese, sitting upright in chair.  Pleasant, in NAD  HENT:  Head: Normocephalic and atraumatic.  Eyes: EOM are normal. No scleral icterus.  Neck: No JVD present. No tracheal deviation present.  Cardiovascular: Normal rate, regular rhythm and normal heart sounds.   Heart sounds distant; no adventitious sounds appreciated  Pulmonary/Chest: Effort normal. No respiratory distress.  Moderate scattered wheezes throughout.  Abdominal: Soft. He exhibits no distension. There is no tenderness. There is no rebound and no guarding.  Musculoskeletal: He exhibits no edema.  Neurological: He is alert and oriented to person, place, and time.  Skin: Skin is warm and dry. No rash noted. He is not diaphoretic.    Lab Results: Basic Metabolic Panel:  Recent Labs Lab 07/06/15 0709  07/08/15 0837  07/11/15 1051 07/12/15 0345  NA 126*  < > 125*  < > 131* 130*  K 4.0  < > 3.9  < > 4.2 4.3  CL 86*  < > 87*  < > 91* 91*  CO2 22  < > 20*  < > 27 25  GLUCOSE 286*  < > 289*  < > 183* 154*  BUN 99*  < > 88*  < > 45* 59*  CREATININE 10.11*  < > 9.84*  < > 7.35* 8.93*  CALCIUM 9.2  < > 8.7*  < >  9.1 8.8*  PHOS 7.9*  --  7.2*  --   --   --   < > = values in this interval not displayed. Liver Function Tests:  Recent Labs Lab 07/06/15 1235 07/08/15 0837 07/10/15 1514  AST 21  --   --   ALT 20  --   --   ALKPHOS 98  --   --   BILITOT 1.6*  --  1.3*  PROT 9.0*  --   --   ALBUMIN 3.3* 3.3*  --    No results for input(s): LIPASE, AMYLASE in the last 168 hours. No results for input(s): AMMONIA in the last 168 hours. CBC:  Recent Labs Lab 07/10/15 1514 07/10/15 2340 07/11/15 1051  WBC 11.1* 8.9 11.1*  NEUTROABS 8.2*  --  8.3*  HGB 7.0* 7.0* 7.8*  HCT 19.6* 19.2* 23.0*  MCV 92.0 93.2 94.3  PLT 414* 387 384  CBC drawn for smear review shows Hgb 7.8. Smear review with Dr. Beryle Beams: 1+ spherocytes 1+ polychromasia Rare schistocytes Normal platelets No Rouleaux Mature PMNs and lymphocytes  Cardiac Enzymes: No results for input(s):  CKTOTAL, CKMB, CKMBINDEX, TROPONINI in the last 168 hours. BNP: No results for input(s): PROBNP in the last 168 hours. D-Dimer:  Recent Labs Lab 07/07/15 0846  DDIMER 4.14*   CBG:  Recent Labs Lab 07/10/15 2101 07/11/15 0736 07/11/15 1133 07/11/15 1659 07/11/15 2127 07/12/15 0741  GLUCAP 271* 176* 188* 219* 210* 158*   Hemoglobin A1C: No results for input(s): HGBA1C in the last 168 hours. Fasting Lipid Panel: No results for input(s): CHOL, HDL, LDLCALC, TRIG, CHOLHDL, LDLDIRECT in the last 168 hours. Thyroid Function Tests: No results for input(s): TSH, T4TOTAL, FREET4, T3FREE, THYROIDAB in the last 168 hours. Coagulation:  Recent Labs Lab 07/07/15 0846 07/12/15 0345  LABPROT 16.4* 15.9*  INR 1.30 1.25   Anemia Panel:  Recent Labs Lab 07/06/15 1130  RETICCTPCT 5.3*   Urine Drug Screen: Drugs of Abuse  No results found for: LABOPIA, COCAINSCRNUR, LABBENZ, AMPHETMU, THCU, LABBARB  Alcohol Level: No results for input(s): ETH in the last 168 hours. Urinalysis: No results for input(s): COLORURINE, LABSPEC,  PHURINE, GLUCOSEU, HGBUR, BILIRUBINUR, KETONESUR, PROTEINUR, UROBILINOGEN, NITRITE, LEUKOCYTESUR in the last 168 hours.  Invalid input(s): APPERANCEUR Misc. Labs: CBC drawn for smear review shows Hgb 7.8. Smear review with Dr. Beryle Beams: 1+ spherocytes 1+ polychromasia Rare schistocytes Normal platelets No Rouleaux Mature PMNs and lymphocytes  Micro Results: Recent Results (from the past 240 hour(s))  MRSA PCR Screening     Status: None   Collection Time: 07/03/15  7:59 PM  Result Value Ref Range Status   MRSA by PCR NEGATIVE NEGATIVE Final    Comment:        The GeneXpert MRSA Assay (FDA approved for NASAL specimens only), is one component of a comprehensive MRSA colonization surveillance program. It is not intended to diagnose MRSA infection nor to guide or monitor treatment for MRSA infections.   Culture, blood (routine x 2)     Status: None   Collection Time: 07/04/15  1:30 PM  Result Value Ref Range Status   Specimen Description BLOOD DIALYSIS  Final   Special Requests BOTTLES DRAWN AEROBIC AND ANAEROBIC 10CC  Final   Culture  Setup Time   Final    GRAM POSITIVE COCCI IN CLUSTERS AEROBIC BOTTLE ONLY CRITICAL RESULT CALLED TO, READ BACK BY AND VERIFIED WITH: A MINTZ 07/05/15 @ [facility]  Final   Report Status 07/07/2015 FINAL  Final   Organism ID, Bacteria STAPHYLOCOCCUS LUGDUNENSIS  Final      Susceptibility   Staphylococcus lugdunensis - MIC*    CIPROFLOXACIN <=0.5 SENSITIVE Sensitive     ERYTHROMYCIN <=0.25 SENSITIVE Sensitive     GENTAMICIN <=0.5 SENSITIVE Sensitive     OXACILLIN 0.5 SENSITIVE Sensitive     TETRACYCLINE <=1 SENSITIVE Sensitive     VANCOMYCIN <=0.5 SENSITIVE Sensitive     TRIMETH/SULFA <=10 SENSITIVE Sensitive     CLINDAMYCIN <=0.25 SENSITIVE Sensitive     RIFAMPIN <=0.5 SENSITIVE Sensitive     Inducible Clindamycin NEGATIVE Sensitive     * STAPHYLOCOCCUS LUGDUNENSIS  Culture, blood (routine x  2)     Status: None   Collection Time: 07/04/15  1:45 PM  Result Value Ref Range Status   Specimen Description BLOOD DIALYSIS  Final   Special Requests BOTTLES DRAWN AEROBIC AND ANAEROBIC 10CC  Final   Culture  Setup Time   Final    GRAM POSITIVE COCCI IN CLUSTERS AEROBIC BOTTLE ONLY CONFIRMED BY V WILKINS CRITICAL RESULT CALLED TO, READ BACK BY AND VERIFIED  WITH: K HAYWOOD 07/06/15 @1114  M VESTAL    Culture   Final    STAPHYLOCOCCUS LUGDUNENSIS SUSCEPTIBILITIES PERFORMED ON PREVIOUS CULTURE WITHIN THE LAST 5 DAYS.    Report Status 07/07/2015 FINAL  Final  Culture, blood (routine x 2)     Status: None (Preliminary result)   Collection Time: 07/07/15  2:35 PM  Result Value Ref Range Status   Specimen Description BLOOD LEFT ANTECUBITAL  Final   Special Requests BOTTLES DRAWN AEROBIC ONLY  5CC  Final   Culture NO GROWTH 4 DAYS  Final   Report Status PENDING  Incomplete  Culture, blood (routine x 2)     Status: None (Preliminary result)   Collection Time: 07/07/15  3:00 PM  Result Value Ref Range Status   Specimen Description BLOOD LEFT HAND  Final   Special Requests IN PEDIATRIC BOTTLE 1CC  Final   Culture NO GROWTH 4 DAYS  Final   Report Status PENDING  Incomplete   Studies/Results: No results found. Medications: I have reviewed the patient's current medications. Scheduled Meds: . sodium chloride   Intravenous Once  . sodium chloride   Intravenous Once  . sodium chloride   Intravenous Once  . sodium chloride   Intravenous Once  . amLODipine  5 mg Oral Daily  . aspirin EC  81 mg Oral Daily  . benzonatate  200 mg Oral TID  . calcium acetate  1,334 mg Oral TID WC  .  ceFAZolin (ANCEF) IV  2 g Intravenous Q M,W,F-HD  . chlorpheniramine-HYDROcodone  5 mL Oral Q12H  . darbepoetin (ARANESP) injection - DIALYSIS  150 mcg Intravenous Q Mon-HD  . doxercalciferol  2.5 mcg Intravenous Q M,W,F-HD  . guaiFENesin  600 mg Oral BID  . heparin subcutaneous  5,000 Units Subcutaneous 3 times  per day  . HYDROcodone-acetaminophen  1 tablet Oral QHS  . insulin aspart  0-5 Units Subcutaneous QHS  . insulin aspart  0-9 Units Subcutaneous TID WC  . insulin aspart  4 Units Subcutaneous TID WC  . insulin glargine  10 Units Subcutaneous QHS  . multivitamin  1 tablet Oral QHS  . pneumococcal 23 valent vaccine  0.5 mL Intramuscular Tomorrow-1000  . polyethylene glycol  17 g Oral Daily  . 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 male with past medical history of DM type II, HTN, ESRD with MWF HD, and OSA who presents to the ED with 2.5 weeks of feeling bad with what he describes as cold like symptoms, runny nose.  Staph Lugdunesis bacteremia, improving -WBC at admission of 18.4 with 78% PMNs trending down to 8.9 yesterday. -Blood cultures 2/2 positive for GPC in clusters, aerobic bottles >> Staph Lugdunensis (pan sensitive) -Will treat for 14 days with Cefazolin 2g every MWF w/ HD. Using 9/27(repeat blood cx negative at this time) as day 1 (9/27-10/11) -Orthopantogram: poor dentition with multiple dental caries and multiple missing and upper lower teeth. Degenerative changes of the left TMJ. Recommend dental follow up -TEE today w/ EF 60%, no  evidence of thrombus or vegetation  Acute worsening on Chronic Anemia. Stabilizing.  Chronic anemia 2/2 CKD.  Hgb 7.0 on 9/30; LDH high (now downtrending), indirect Bilirubin high, haptoglobin low, c/w  hemolysis.  Cold agglutinin titers 1:512 on 9/28 (1:2048 on 9/27). Etiology remains unclear at this time (benign cold agglutinin vs mixed AIHA vs cryoglobulins). -Hgb 7.5 on admission, down from 11.3 on 9/14.Now s/p 5 U pRBCs.  Hgb improved to 7.5 (10/2). - LDH peak 356 on 9/29.  Now 301 (10/2). - PT improving; PTT remains elevated -Consult hematology on 9/26, appreciate recommendations.   - unlikely this is a delayed transfusion reaction.  - EBV and Mycoplasma IgM and IgG negative, HCV negative, possibly benign   - will use warmer during transfusion to prevent cold agglutanins  - continue to monitor CBC closely and transfuse as necessary -monitor CBC closely and transfuse as necessary Hgb < 7.0. [ ]  anti-P evaluation for Paroxysmal Cold Hemoglobinuria, though low likelihood. [ ]  Repeat DAT/IAT   Cough, likely viral bronchitis. - S/p Azithromycin x5 days outpatient -chest x-ray on admission compared to 9/14 shows improved right bibasilar atelectasis. Repeat CXR yesterday shows persistent right basilar atelectasis. -incentive spirometry -strep pneumo negative, legionella negative, mycoplasma negative - continue with supportive care with Tussionex q12h, Mucinex 600mg  PO BID, Tessalon 200mg  TID, Duonebs q4h prn and Norco qhs  Diabetes mellitus -sliding scale insulin, Lantus 10 units qhs, Novolog 4units TID with meals -Hgb A1c 8.0  ESRD on M,W,F HD at [facility] recs. - HD today inpatient, will continue MWF   Hypertension - Increase to Amlodipine 10mg  daily.   Hyponatremia, chronic  -likely due to hyperglycemia combined with possible increased volume from renal disease -continue to monitor  Hyperkalemia - resolved with kayex + HD -potassium was found to be 6.6 initially. Improved with kayex and also HD.   OSA -CPAP nightly   Dispo: Disposition is deferred at this time, awaiting improvement of current  medical problems.   The patient does have a current PCP (Tresa Garter, MD) and does not need an [facility] hospital follow-up appointment after discharge.  The patient does not have transportation limitations that hinder transportation to clinic appointments.  Patient will need Home Health Physical Therapy and Supervision for mobility/OOB   LOS: 9 days   Iline Oven, MD 07/12/2015, 10:58 AM

## 2015-07-13 ENCOUNTER — Telehealth: Payer: Self-pay

## 2015-07-13 DIAGNOSIS — B957 Other staphylococcus as the cause of diseases classified elsewhere: Secondary | ICD-10-CM

## 2015-07-13 DIAGNOSIS — D591 Other autoimmune hemolytic anemias: Secondary | ICD-10-CM

## 2015-07-13 DIAGNOSIS — N186 End stage renal disease: Secondary | ICD-10-CM

## 2015-07-13 DIAGNOSIS — Z992 Dependence on renal dialysis: Secondary | ICD-10-CM

## 2015-07-13 LAB — GLUCOSE, CAPILLARY
GLUCOSE-CAPILLARY: 164 mg/dL — AB (ref 65–99)
GLUCOSE-CAPILLARY: 163 mg/dL — AB (ref 65–99)
Glucose-Capillary: 163 mg/dL — ABNORMAL HIGH (ref 65–99)
Glucose-Capillary: 140 mg/dL — ABNORMAL HIGH (ref 65–99)

## 2015-07-13 LAB — DIRECT ANTIGLOBULIN TEST (NOT AT ARMC)
DAT, COMPLEMENT: POSITIVE
DAT, IGG: NEGATIVE

## 2015-07-13 LAB — CBC
HCT: 25.3 % — ABNORMAL LOW (ref 39.0–52.0)
HEMOGLOBIN: 8.6 g/dL — AB (ref 13.0–17.0)
MCH: 32.1 pg (ref 26.0–34.0)
MCHC: 34 g/dL (ref 30.0–36.0)
MCV: 94.4 fL (ref 78.0–100.0)
PLATELETS: 370 10*3/uL (ref 150–400)
RBC: 2.68 MIL/uL — AB (ref 4.22–5.81)
RDW: 16.8 % — ABNORMAL HIGH (ref 11.5–15.5)
WBC: 10.8 10*3/uL — ABNORMAL HIGH (ref 4.0–10.5)

## 2015-07-13 MED ORDER — AMLODIPINE BESYLATE 5 MG PO TABS
5.0000 mg | ORAL_TABLET | Freq: Every day | ORAL | Status: DC
  Administered 2015-07-13: 5 mg via ORAL
  Filled 2015-07-13: qty 1

## 2015-07-13 NOTE — Procedures (Signed)
I was present at this session.  I have reviewed the session itself and made appropriate changes.  VOl xs, HTN,  Needs longer HD with higher UF  Seth [name] L 10/3/20169:10 AM

## 2015-07-13 NOTE — Progress Notes (Signed)
Subjective: Interval History: has complaints, weak.  Objective: Vital signs in last 24 hours: Temp:  [97.5 F (36.4 C)-98.4 F (36.9 C)] 97.5 F (36.4 C) (10/03 0500) Pulse Rate:  [87-91] 87 (10/03 0500) Resp:  [20-22] 20 (10/03 0500) BP: (143-164)/(87-97) 160/90 mmHg (10/03 0500) SpO2:  [96 %-100 %] 96 % (10/03 0500) Weight:  [147.102 kg (324 lb 4.8 oz)] 147.102 kg (324 lb 4.8 oz) (10/02 2100) Weight change:   Intake/Output from previous day: 10/02 0701 - 10/03 0700 In: 240 [P.O.:240] Out: 450 [Urine:450] Intake/Output this shift:    General appearance: alert, cooperative and morbidly obese Resp: diminished breath sounds bilaterally and rales bibasilar Cardio: S1, S2 normal and systolic murmur: holosystolic 2/6, blowing at apex GI: massive, pos bs, soft Extremities: edema 2-3+ and AVF RLA  Lab Results:  Recent Labs  07/11/15 1051 07/13/15 0130  WBC 11.1* 10.8*  HGB 7.8* 8.6*  HCT 23.0* 25.3*  PLT 384 370   BMET:  Recent Labs  07/11/15 1051 07/12/15 0345  NA 131* 130*  K 4.2 4.3  CL 91* 91*  CO2 27 25  GLUCOSE 183* 154*  BUN 45* 59*  CREATININE 7.35* 8.93*  CALCIUM 9.1 8.8*   No results for input(s): PTH in the last 72 hours. Iron Studies: No results for input(s): IRON, TIBC, TRANSFERRIN, FERRITIN in the last 72 hours.  Studies/Results: No results found.  I have reviewed the patient's current medications.  Assessment/Plan: 1 ESRD vol xs, HTN , needs longer HD, ^ UF 2 HTn arrange meds around HD 3 Anemia ? Cold agglut, ESA 4 Massive obesity 5 Staph sepsis ? BH, on Ancef 6DM per primary P HD, change timing  bp meds,  AB     LOS: 10 days   Latrisa Hellums L 07/13/2015,9:11 AM

## 2015-07-13 NOTE — Telephone Encounter (Signed)
Notified by Carmela Hurt, CM with [facility] that the patient needs a hospital follow up appointment.  An appointment has been scheduled for 07/23/15 @ 1000 at [facility] and a voice mail message was left for Marthenia Rolling, CM [facility] [address] informing her of the appointment.

## 2015-07-13 NOTE — Progress Notes (Signed)
[name], male   DOB: [birth year], 49 y.o.   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 amLODipine  5 mg Oral QHS  . aspirin EC  81 mg Oral Daily  . benzonatate  200 mg Oral TID  . calcium acetate  1,334 mg Oral TID WC  .  ceFAZolin (ANCEF) IV  2 g Intravenous Q M,W,F-HD  . chlorpheniramine-HYDROcodone  5 mL Oral Q12H  . darbepoetin (ARANESP) injection - DIALYSIS  150 mcg Intravenous Q Mon-HD  . doxercalciferol  2.5 mcg Intravenous Q M,W,F-HD  . guaiFENesin  600 mg Oral BID  . heparin subcutaneous  5,000 Units Subcutaneous 3 times per day  . HYDROcodone-acetaminophen  1 tablet Oral QHS  . insulin aspart  0-5 Units Subcutaneous QHS  . insulin aspart  0-9 Units Subcutaneous TID WC  . insulin aspart  4 Units Subcutaneous TID WC  . insulin glargine  10 Units Subcutaneous QHS  . multivitamin  1 tablet Oral QHS  . pneumococcal 23 valent vaccine  0.5 mL Intramuscular Tomorrow-1000  . polyethylene glycol  17 g Oral Daily  . senna-docusate  2 tablet Oral QHS    SUBJECTIVE: Seth [name] is currently on hemodialysis. He is feeling better.   Past Medical History  Diagnosis Date  . Diabetes mellitus   . Hypertension   . Chronic kidney disease   . 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 Sleep apnea     Social History  Substance Use Topics  . Smoking status: Never Smoker   . Smokeless tobacco: Never Used  . Alcohol Use: 0.6 oz/week    1 Cans of beer per week    Family History  Problem Relation Age of Onset    . Diabetes Mother   . Diabetes Sister   . Diabetes Brother    No Known Allergies  OBJECTIVE: Filed Vitals:   07/13/15 1100 07/13/15 1130 07/13/15 1256 07/13/15 1415  BP: 143/87 141/88 150/87 145/80  Pulse: 81 92 96 88  Temp:   98.1 F (36.7 C) 98.1 F (36.7 C)  TempSrc:   Oral Oral  Resp:   20 20  Height:      Weight:      SpO2:    98%   Body mass index is 49.32 kg/(m^2).  General: he is wearing his CPAP mask. He is in no distress Skin: no rash Lungs: clear Cor: regular S1 and S2 with no murmur  Lab Results Lab Results  Component Value Date   WBC 10.8* 07/13/2015   HGB 8.6* 07/13/2015   HCT 25.3* 07/13/2015   MCV 94.4 07/13/2015   PLT 370 07/13/2015    Lab Results  Component Value Date   CREATININE 8.93* 07/12/2015   BUN 59* 07/12/2015   NA 130* 07/12/2015   K 4.3 07/12/2015   CL 91* 07/12/2015   CO2 25 07/12/2015    Lab Results  Component Value Date  ALT 20 07/06/2015   AST 21 07/06/2015   ALKPHOS 98 07/06/2015   BILITOT 1.3* 07/10/2015     Microbiology: Recent Results (from the past 240 hour(s))  MRSA PCR Screening     Status: None   Collection Time: 07/03/15  7:59 PM  Result Value Ref Range Status   MRSA by PCR NEGATIVE NEGATIVE Final    Comment:        The GeneXpert MRSA Assay (FDA approved for NASAL specimens only), is one component of a comprehensive MRSA colonization surveillance program. It is not intended to diagnose MRSA infection nor to guide or monitor treatment for MRSA infections.   Culture, blood (routine x 2)     Status: None   Collection Time: 07/04/15  1:30 PM  Result Value Ref Range Status   Specimen Description BLOOD DIALYSIS  Final   Special Requests BOTTLES DRAWN AEROBIC AND ANAEROBIC 10CC  Final   Culture  Setup Time   Final    GRAM POSITIVE COCCI IN CLUSTERS AEROBIC BOTTLE ONLY CRITICAL RESULT CALLED TO, READ BACK BY AND VERIFIED WITH: A MINTZ 07/05/15 @ [facility]  Final    Report Status 07/07/2015 FINAL  Final   Organism ID, Bacteria STAPHYLOCOCCUS LUGDUNENSIS  Final      Susceptibility   Staphylococcus lugdunensis - MIC*    CIPROFLOXACIN <=0.5 SENSITIVE Sensitive     ERYTHROMYCIN <=0.25 SENSITIVE Sensitive     GENTAMICIN <=0.5 SENSITIVE Sensitive     OXACILLIN 0.5 SENSITIVE Sensitive     TETRACYCLINE <=1 SENSITIVE Sensitive     VANCOMYCIN <=0.5 SENSITIVE Sensitive     TRIMETH/SULFA <=10 SENSITIVE Sensitive     CLINDAMYCIN <=0.25 SENSITIVE Sensitive     RIFAMPIN <=0.5 SENSITIVE Sensitive     Inducible Clindamycin NEGATIVE Sensitive     * STAPHYLOCOCCUS LUGDUNENSIS  Culture, blood (routine x 2)     Status: None   Collection Time: 07/04/15  1:45 PM  Result Value Ref Range Status   Specimen Description BLOOD DIALYSIS  Final   Special Requests BOTTLES DRAWN AEROBIC AND ANAEROBIC 10CC  Final   Culture  Setup Time   Final    GRAM POSITIVE COCCI IN CLUSTERS AEROBIC BOTTLE ONLY CONFIRMED BY V WILKINS CRITICAL RESULT CALLED TO, READ BACK BY AND VERIFIED WITH: K HAYWOOD 07/06/15 @1114  M VESTAL    Culture   Final    STAPHYLOCOCCUS LUGDUNENSIS SUSCEPTIBILITIES PERFORMED ON PREVIOUS CULTURE WITHIN THE LAST 5 DAYS.    Report Status 07/07/2015 FINAL  Final  Culture, blood (routine x 2)     Status: None   Collection Time: 07/07/15  2:35 PM  Result Value Ref Range Status   Specimen Description BLOOD LEFT ANTECUBITAL  Final   Special Requests BOTTLES DRAWN AEROBIC ONLY  5CC  Final   Culture NO GROWTH 5 DAYS  Final   Report Status 07/12/2015 FINAL  Final  Culture, blood (routine x 2)     Status: None   Collection Time: 07/07/15  3:00 PM  Result Value Ref Range Status   Specimen Description BLOOD LEFT HAND  Final   Special Requests IN PEDIATRIC BOTTLE [place]  Final   Culture NO GROWTH 5 DAYS  Final   Report Status 07/12/2015 FINAL  Final     ASSESSMENT: 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 He had a recent upper respiratory  infection that may be a trigger. The exact cause is uncertain. I doubt that his staphylococcal bacteremia  is the cause. He has no evidence of complications of his bacteremia. He will stay on cefazolin after hemodialysis for 14 days total through 07/21/2015. If his hemoglobin remains stable without further evidence of hemolysis he may be okay for discharge soon.  PLAN: 1. Continue cefazolin through 07/21/2015 2. Monitor hemoglobin    Michel Bickers, [place] for [place] [phone] pager   [phone] cell 07/13/2015, 2:26 PM

## 2015-07-13 NOTE — Progress Notes (Signed)
ANTIBIOTIC CONSULT NOTE - FOLLOW UP  Pharmacy Consult for Cefazolin Indication: bacteremia  No Known Allergies  Patient Measurements: Height: 5\' 8"  (172.7 cm) Weight: (!) 324 lb 4.8 oz (147.102 kg) IBW/kg (Calculated) : 68.4  Vital Signs: Temp: 98.1 F (36.7 C) (10/03 1256) Temp Source: Oral (10/03 1256) BP: 150/87 mmHg (10/03 1256) Pulse Rate: 96 (10/03 1256)  Labs:  Recent Labs  07/10/15 1514 07/10/15 2340 07/11/15 1051 07/12/15 0345 07/13/15 0130  WBC 11.1* 8.9 11.1*  --  10.8*  HGB 7.0* 7.0* 7.8*  --  8.6*  PLT 414* 387 384  --  370  CREATININE 11.19*  --  7.35* 8.93*  --      Microbiology: Recent Results (from the past 720 hour(s))  MRSA PCR Screening     Status: None   Collection Time: 07/03/15  7:59 PM  Result Value Ref Range Status   MRSA by PCR NEGATIVE NEGATIVE Final    Comment:        The GeneXpert MRSA Assay (FDA approved for NASAL specimens only), is one component of a comprehensive MRSA colonization surveillance program. It is not intended to diagnose MRSA infection nor to guide or monitor treatment for MRSA infections.   Culture, blood (routine x 2)     Status: None   Collection Time: 07/04/15  1:30 PM  Result Value Ref Range Status   Specimen Description BLOOD DIALYSIS  Final   Special Requests BOTTLES DRAWN AEROBIC AND ANAEROBIC 10CC  Final   Culture  Setup Time   Final    GRAM POSITIVE COCCI IN CLUSTERS AEROBIC BOTTLE ONLY CRITICAL RESULT CALLED TO, READ BACK BY AND VERIFIED WITH: A MINTZ 07/05/15 @ [place]  Final   Report Status 07/07/2015 FINAL  Final   Organism ID, Bacteria STAPHYLOCOCCUS LUGDUNENSIS  Final      Susceptibility   Staphylococcus lugdunensis - MIC*    CIPROFLOXACIN <=0.5 SENSITIVE Sensitive     ERYTHROMYCIN <=0.25 SENSITIVE Sensitive     GENTAMICIN <=0.5 SENSITIVE Sensitive     OXACILLIN 0.5 SENSITIVE Sensitive     TETRACYCLINE <=1 SENSITIVE Sensitive     VANCOMYCIN  <=0.5 SENSITIVE Sensitive     TRIMETH/SULFA <=10 SENSITIVE Sensitive     CLINDAMYCIN <=0.25 SENSITIVE Sensitive     RIFAMPIN <=0.5 SENSITIVE Sensitive     Inducible Clindamycin NEGATIVE Sensitive     * STAPHYLOCOCCUS LUGDUNENSIS  Culture, blood (routine x 2)     Status: None   Collection Time: 07/04/15  1:45 PM  Result Value Ref Range Status   Specimen Description BLOOD DIALYSIS  Final   Special Requests BOTTLES DRAWN AEROBIC AND ANAEROBIC 10CC  Final   Culture  Setup Time   Final    GRAM POSITIVE COCCI IN CLUSTERS AEROBIC BOTTLE ONLY CONFIRMED BY V WILKINS CRITICAL RESULT CALLED TO, READ BACK BY AND VERIFIED WITH: K HAYWOOD 07/06/15 @1114  M VESTAL    Culture   Final    STAPHYLOCOCCUS LUGDUNENSIS SUSCEPTIBILITIES PERFORMED ON PREVIOUS CULTURE WITHIN THE LAST 5 DAYS.    Report Status 07/07/2015 FINAL  Final  Culture, blood (routine x 2)     Status: None   Collection Time: 07/07/15  2:35 PM  Result Value Ref Range Status   Specimen Description BLOOD LEFT ANTECUBITAL  Final   Special Requests BOTTLES DRAWN AEROBIC ONLY  5CC  Final   Culture NO GROWTH 5 DAYS  Final   Report Status 07/12/2015 FINAL  Final  Culture, blood (  routine x 2)     Status: None   Collection Time: 07/07/15  3:00 PM  Result Value Ref Range Status   Specimen Description BLOOD LEFT HAND  Final   Special Requests IN PEDIATRIC BOTTLE [place]  Final   Culture NO GROWTH 5 DAYS  Final   Report Status 07/12/2015 FINAL  Final   Assessment: 49 y/o male with ESRD on cefazolin for Staph lugdunensis bacteremia. Plan is to treat 2 weeks. He is afebrile and WBC are trending down. TEE neg for thrombus or vegetation.  Vancomycin 9/25 >> 9/27  Nafcillin 9/27>>9/28  Cefazolin 9/28>>   9/24 BCx: staph lugdunensis 2/2 - S-oxacillin  9/23 MRSA PCR neg     Goal of Therapy:  appropriate Cefazolin dose for renal function and infection  Plan:  Continue cefazolin 2 g IV after HD MWF - stop date of 07/21/15 put in place Pharmacy  signing off, please re-consult if needed  [facility], [place].D., BCPS Clinical Pharmacist Pager: [phone] 07/13/2015 1:35 PM

## 2015-07-13 NOTE — Progress Notes (Signed)
Patient stated that he will apply CPAP when he was ready.  RT filled water chamber with sterile water and fixed the straps for the patient. Patient on Auto titrate of 20-8cmH2O.  RT will continue to monitor.

## 2015-07-13 NOTE — Progress Notes (Signed)
   07/13/15 1600  Clinical Encounter Type  Visited With Patient and family together  Visit Type Spiritual support  Referral From (Physical therapist)  Spiritual Encounters  Spiritual Needs Prayer;Emotional  Stress Factors  Patient Stress Factors Family relationships;Financial concerns;Loss of control;Major life changes  Family Stress Factors Loss  Responded to page at 1600 after reviewing chart. Spoke to physical therapist prior to entering room; was informed patient making physical progress but confidence shaken. Patient cried as soon as I entered room. Sister, fiancee, and children were present. Pt shared difficulty of being in hospital when he had a family to support and concerns about fiancee who was also in tears. After some conversation, pt requested prayer. Prayed with him and fiancee. Assured him we were available. Patient's faith strong.

## 2015-07-13 NOTE — Progress Notes (Signed)
Subjective: Seth [name]. Patient denies complaints.  He ambulated with PT yesterday and enjoyed it.  Objective: Vital signs in last 24 hours: Filed Vitals:   07/13/15 1030 07/13/15 1100 07/13/15 1130 07/13/15 1256  BP: 160/80 143/87 141/88 150/87  Pulse: 94 81 92 96  Temp:    98.1 F (36.7 C)  TempSrc:    Oral  Resp:    20  Height:      Weight:      SpO2:       Weight change:   Intake/Output Summary (Last 24 hours) at 07/13/15 1319 Last data filed at 07/13/15 1256  Gross per 24 hour  Intake    240 ml  Output   4450 ml  Net  -4210 ml   Physical Exam  Constitutional: He is oriented to person, place, and time and well-developed, well-nourished, and in no distress. No distress.  Morbidly obese, sitting upright in chair.  Pleasant, in NAD  HENT:  Head: Normocephalic and atraumatic.  Eyes: EOM are normal. No scleral icterus.  Neck: No JVD present. No tracheal deviation present.  Cardiovascular: Normal rate, regular rhythm and normal heart sounds.   Heart sounds distant; no adventitious sounds appreciated  Pulmonary/Chest: Effort normal. No respiratory distress. He has no wheezes.  No wheezes appreciated.  Abdominal: Soft. He exhibits no distension. There is no tenderness. There is no rebound and no guarding.  Musculoskeletal: He exhibits no edema.  Neurological: He is alert and oriented to person, place, and time.  Skin: Skin is warm and dry. No rash noted. He is not diaphoretic.    Lab Results: Basic Metabolic Panel:  Recent Labs Lab 07/08/15 0837  07/11/15 1051 07/12/15 0345  NA 125*  < > 131* 130*  K 3.9  < > 4.2 4.3  CL 87*  < > 91* 91*  CO2 20*  < > 27 25  GLUCOSE 289*  < > 183* 154*  BUN 88*  < > 45* 59*  CREATININE 9.84*  < > 7.35* 8.93*  CALCIUM 8.7*  < > 9.1 8.8*  PHOS 7.2*  --   --   --   < > = values in this interval not displayed. Liver Function Tests:  Recent Labs Lab 07/08/15 0837 07/10/15 1514  BILITOT  --  1.3*  ALBUMIN 3.3*  --    No  results for input(s): LIPASE, AMYLASE in the last 168 hours. No results for input(s): AMMONIA in the last 168 hours. CBC:  Recent Labs Lab 07/10/15 1514  07/11/15 1051 07/13/15 0130  WBC 11.1*  < > 11.1* 10.8*  NEUTROABS 8.2*  --  8.3*  --   HGB 7.0*  < > 7.8* 8.6*  HCT 19.6*  < > 23.0* 25.3*  MCV 92.0  < > 94.3 94.4  PLT 414*  < > 384 370  < > = values in this interval not displayed.CBC drawn for smear review shows Hgb 7.8. Smear review with Dr. Beryle Beams (10/2): 1+ spherocytes 1+ polychromasia Rare schistocytes Normal platelets No Rouleaux Mature PMNs and lymphocytes  Cardiac Enzymes: No results for input(s): CKTOTAL, CKMB, CKMBINDEX, TROPONINI in the last 168 hours. BNP: No results for input(s): PROBNP in the last 168 hours. D-Dimer:  Recent Labs Lab 07/07/15 0846  DDIMER 4.14*   CBG:  Recent Labs Lab 07/11/15 2127 07/12/15 0741 07/12/15 1149 07/12/15 1643 07/12/15 2019 07/13/15 0737  GLUCAP 210* 158* 124* 188* 164* 163*   Hemoglobin A1C: No results for input(s): HGBA1C in the last 168 hours.  Fasting Lipid Panel: No results for input(s): CHOL, HDL, LDLCALC, TRIG, CHOLHDL, LDLDIRECT in the last 168 hours. Thyroid Function Tests: No results for input(s): TSH, T4TOTAL, FREET4, T3FREE, THYROIDAB in the last 168 hours. Coagulation:  Recent Labs Lab 07/07/15 0846 07/12/15 0345  LABPROT 16.4* 15.9*  INR 1.30 1.25   Anemia Panel: No results for input(s): VITAMINB12, FOLATE, FERRITIN, TIBC, IRON, RETICCTPCT in the last 168 hours. Urine Drug Screen: Drugs of Abuse  No results found for: LABOPIA, COCAINSCRNUR, LABBENZ, AMPHETMU, THCU, LABBARB  Alcohol Level: No results for input(s): ETH in the last 168 hours. Urinalysis: No results for input(s): COLORURINE, LABSPEC, PHURINE, GLUCOSEU, HGBUR, BILIRUBINUR, KETONESUR, PROTEINUR, UROBILINOGEN, NITRITE, LEUKOCYTESUR in the last 168 hours.  Invalid input(s): APPERANCEUR Misc. Labs: 07/12/2015 CBC drawn  for smear review shows Hgb 7.8. Smear review with Dr. Beryle Beams: 1+ spherocytes 1+ polychromasia Rare schistocytes Normal platelets No Rouleaux Mature PMNs and lymphocytes  Micro Results: Recent Results (from the past 240 hour(s))  MRSA PCR Screening     Status: None   Collection Time: 07/03/15  7:59 PM  Result Value Ref Range Status   MRSA by PCR NEGATIVE NEGATIVE Final    Comment:        The GeneXpert MRSA Assay (FDA approved for NASAL specimens only), is one component of a comprehensive MRSA colonization surveillance program. It is not intended to diagnose MRSA infection nor to guide or monitor treatment for MRSA infections.   Culture, blood (routine x 2)     Status: None   Collection Time: 07/04/15  1:30 PM  Result Value Ref Range Status   Specimen Description BLOOD DIALYSIS  Final   Special Requests BOTTLES DRAWN AEROBIC AND ANAEROBIC 10CC  Final   Culture  Setup Time   Final    GRAM POSITIVE COCCI IN CLUSTERS AEROBIC BOTTLE ONLY CRITICAL RESULT CALLED TO, READ BACK BY AND VERIFIED WITH: A MINTZ 07/05/15 @ [place]  Final   Report Status 07/07/2015 FINAL  Final   Organism ID, Bacteria STAPHYLOCOCCUS LUGDUNENSIS  Final      Susceptibility   Staphylococcus lugdunensis - MIC*    CIPROFLOXACIN <=0.5 SENSITIVE Sensitive     ERYTHROMYCIN <=0.25 SENSITIVE Sensitive     GENTAMICIN <=0.5 SENSITIVE Sensitive     OXACILLIN 0.5 SENSITIVE Sensitive     TETRACYCLINE <=1 SENSITIVE Sensitive     VANCOMYCIN <=0.5 SENSITIVE Sensitive     TRIMETH/SULFA <=10 SENSITIVE Sensitive     CLINDAMYCIN <=0.25 SENSITIVE Sensitive     RIFAMPIN <=0.5 SENSITIVE Sensitive     Inducible Clindamycin NEGATIVE Sensitive     * STAPHYLOCOCCUS LUGDUNENSIS  Culture, blood (routine x 2)     Status: None   Collection Time: 07/04/15  1:45 PM  Result Value Ref Range Status   Specimen Description BLOOD DIALYSIS  Final   Special Requests BOTTLES DRAWN AEROBIC  AND ANAEROBIC 10CC  Final   Culture  Setup Time   Final    GRAM POSITIVE COCCI IN CLUSTERS AEROBIC BOTTLE ONLY CONFIRMED BY V WILKINS CRITICAL RESULT CALLED TO, READ BACK BY AND VERIFIED WITH: K HAYWOOD 07/06/15 @1114  M VESTAL    Culture   Final    STAPHYLOCOCCUS LUGDUNENSIS SUSCEPTIBILITIES PERFORMED ON PREVIOUS CULTURE WITHIN THE LAST 5 DAYS.    Report Status 07/07/2015 FINAL  Final  Culture, blood (routine x 2)     Status: None   Collection Time: 07/07/15  2:35 PM  Result Value Ref Range Status  Specimen Description BLOOD LEFT ANTECUBITAL  Final   Special Requests BOTTLES DRAWN AEROBIC ONLY  5CC  Final   Culture NO GROWTH 5 DAYS  Final   Report Status 07/12/2015 FINAL  Final  Culture, blood (routine x 2)     Status: None   Collection Time: 07/07/15  3:00 PM  Result Value Ref Range Status   Specimen Description BLOOD LEFT HAND  Final   Special Requests IN PEDIATRIC BOTTLE [place]  Final   Culture NO GROWTH 5 DAYS  Final   Report Status 07/12/2015 FINAL  Final   Studies/Results: No results found. Medications: I have reviewed the patient's current medications. Scheduled Meds: . sodium chloride   Intravenous Once  . sodium chloride   Intravenous Once  . sodium chloride   Intravenous Once  . sodium chloride   Intravenous Once  . amLODipine  5 mg Oral QHS  . aspirin EC  81 mg Oral Daily  . benzonatate  200 mg Oral TID  . calcium acetate  1,334 mg Oral TID WC  .  ceFAZolin (ANCEF) IV  2 g Intravenous Q M,W,F-HD  . chlorpheniramine-HYDROcodone  5 mL Oral Q12H  . darbepoetin (ARANESP) injection - DIALYSIS  150 mcg Intravenous Q Mon-HD  . doxercalciferol  2.5 mcg Intravenous Q M,W,F-HD  . guaiFENesin  600 mg Oral BID  . heparin subcutaneous  5,000 Units Subcutaneous 3 times per day  . HYDROcodone-acetaminophen  1 tablet Oral QHS  . insulin aspart  0-5 Units Subcutaneous QHS  . insulin aspart  0-9 Units Subcutaneous TID WC  . insulin aspart  4 Units Subcutaneous TID WC  . insulin  glargine  10 Units Subcutaneous QHS  . multivitamin  1 tablet Oral QHS  . pneumococcal 23 valent vaccine  0.5 mL Intramuscular Tomorrow-1000  . polyethylene glycol  17 g Oral Daily  . 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 male with past medical history of DM type II, HTN, ESRD with MWF HD, and OSA who presents to the ED with 2.5 weeks of feeling bad with what he describes as cold like symptoms, runny nose.  Acute worsening on Chronic Anemia. improving.  Chronic anemia 2/2 CKD.  Hgb 7.0 on 9/30, now increasing to 8.6 (10/3). LDH peaked and now downtrending.  PT improving.  No transfusions needed for 48 hours.  - LDH high (now downtrending), indirect Bilirubin high, haptoglobin low, c/w hemolysis.  Cold agglutinin titers 1:512 on 9/28 (1:2048 on 9/27). Etiology remains unclear at this time (benign cold agglutinin vs mixed AIHA vs cryoglobulins).   - Repeat DAT/IAT (10/3) shows complement but no IgG. -Hgb 7.5 on admission, down from 11.3 on 9/14.Now s/p 5 U pRBCs.  Hgb improved to 8.6 (10/3). - LDH peak 356 on 9/29.  Now 301 (10/2). - PT improving; PTT remains elevated -Consult hematology on 9/26, appreciate recommendations.   - unlikely this is a delayed transfusion reaction.  - EBV and Mycoplasma IgM and IgG negative, HCV negative, possibly benign   - will  use warmer during transfusion to prevent cold  agglutanins  - continue to monitor CBC closely and transfuse as necessary -monitor CBC closely and transfuse as necessary Hgb < 7.0. [ ]  anti-P evaluation for Paroxysmal Cold Hemoglobinuria, though low likelihood.  Staph Lugdunesis bacteremia, improving.  Cefazolin 2g every MWF with dialysis (9/27 - 10/11). -WBC at admission of 18.4 with 78% PMNs trending down to 8.9 9/30. -Blood cultures 2/2 positive for GPC in clusters, aerobic bottles >> Staph Lugdunensis (pan sensitive) -Will treat for 14 days with Cefazolin 2g every MWF w/ HD. Using 9/27 (repeat blood cx negative at this time) as day 1 (9/27-10/11) -Orthopantogram: poor dentition with multiple dental caries and multiple missing and upper lower teeth. Degenerative changes of the left TMJ. Recommend dental follow up -TEE today w/ EF 60%, no evidence of thrombus or vegetation  Diabetes mellitus -sliding scale insulin, Lantus 10 units qhs, Novolog 4units TID with meals -Hgb A1c 8.0  ESRD on M,W,F HD at [facility] nephrology recs. - HD today inpatient, will continue MWF   Hypertension - Increase to Amlodipine 10mg  daily.   Hyponatremia, chronic  -likely due to hyperglycemia combined with possible increased volume from renal disease -continue to monitor  Cough, likely viral bronchitis. resolved - S/p Azithromycin x5 days outpatient -chest x-ray on admission compared to 9/14 shows improved right bibasilar atelectasis. Repeat CXR yesterday shows persistent right basilar atelectasis. -incentive spirometry -strep pneumo negative, legionella negative, mycoplasma negative - continue with supportive care with Tussionex q12h, Mucinex 600mg  PO BID, Tessalon 200mg  TID, Duonebs q4h prn and Norco qhs  Hyperkalemia, resolved with kayex + HD -potassium was found to be 6.6 initially. Improved with kayex and also HD.   OSA -CPAP nightly   Dispo: Disposition is deferred at this time, awaiting improvement of  current medical problems. Possible discharge in 1 day.  The patient does have a current PCP (Tresa Garter, MD) and does not need an [facility] hospital follow-up appointment after discharge.  The patient does not have transportation limitations that hinder transportation to clinic appointments.  Patient will need Home Health Physical Therapy and Supervision for mobility/OOB   LOS: 10 days   Iline Oven, MD 07/13/2015, 1:19 PM

## 2015-07-13 NOTE — Patient Outreach (Signed)
[facility]) Care Management  07/13/2015  Seth [name] 13-Jul-1966 [id number]   Referral from Marthenia Rolling, RN to assign Pharmacy and Community RN, assigned Harlow Asa, PharmD and Erenest Rasher, RN.  Thanks, Ronnell [place]. [place], [place] Assistant Phone: [phone] Fax: [phone]

## 2015-07-13 NOTE — Progress Notes (Signed)
Physical Therapy Treatment Patient Details Name: Seth [name] MRN: [id number] DOB: 1966-04-13 Today's Date: 07/13/2015    History of Present Illness Patient is a 49 yo male admitted 07/03/15 with cough, SOB.  Patient with bacteremia (staph lugdunensis), anemia, hyperkalemia.    PMH:  DM type II, HTN, ESRD with MWF HD, anemia, morbid obesity, and OSA     PT Comments    Noting continuing progress with endurance and amb distance; Still, quite fatigued and shaky toward end of walk, so continue to recommend chair follow for safety  Follow Up Recommendations  Home health PT;Supervision for mobility/OOB     Equipment Recommendations  Rolling walker with 5" wheels;3in1 (PT) (Bariatric equipment)    Recommendations for Other Services OT consult     Precautions / Restrictions Precautions Precautions: Fall Restrictions Weight Bearing Restrictions: No    Mobility  Bed Mobility               General bed mobility comments: Patient sitting in bari recliner  Transfers Overall transfer level: Needs assistance Equipment used: Rolling walker (2 wheeled) (Bariatric) Transfers: Sit to/from Stand Sit to Stand: Min assist         General transfer comment: Assist to steady during transfer to standing.  Cues for hand placement and safety and assist to steady RW; Patient unsteady in stance initially.  Pt attempted sit to stand without UE support, and was unable due to weakness -- reassured pt that we will work on this  Ambulation/Gait Ambulation/Gait assistance: Min guard;+2 safety/equipment (chair follow) Ambulation Distance (Feet): 80 Feet Assistive device: Rolling walker (2 wheeled) Gait Pattern/deviations: Step-through pattern (much more steady) Gait velocity: decreased   General Gait Details: More steady today, with less reprots of feeling that his legs are like noodles; cues to self-monitor for activity tolerance   Stairs            Wheelchair Mobility    Modified  [place] (Stroke Patients Only)       Balance             Standing balance-Leahy Scale: Poor                      Cognition Arousal/Alertness: Awake/alert Behavior During Therapy: WFL for tasks assessed/performed Overall Cognitive Status: Within Functional Limits for tasks assessed                      Exercises      General Comments General comments (skin integrity, edema, etc.): Pt seemed pleased with progress      Pertinent Vitals/Pain Pain Assessment: Faces Faces Pain Scale: Hurts a little bit Pain Location: did not specify; grimace with getting up Pain Descriptors / Indicators: Grimacing Pain Intervention(s): Monitored during session    Home Living                      Prior Function            PT Goals (current goals can now be found in the care plan section) Acute Rehab PT Goals Patient Stated Goal: To get stronger PT Goal Formulation: With patient Time For Goal Achievement: 07/18/15 Potential to Achieve Goals: Good Progress towards PT goals: Progressing toward goals    Frequency  Min 3X/week    PT Plan Current plan remains appropriate    Co-evaluation             End of Session Equipment Utilized During Treatment: Gait belt Activity  Tolerance: Patient limited by fatigue;Patient limited by pain Patient left: in chair;with call bell/phone within reach     Time: 1500-1517 PT Time Calculation (min) (ACUTE ONLY): 17 min  Charges:  $Gait Training: 8-22 mins                    G Codes:      Quin Hoop 07/13/2015, 4:48 PM  Roney [place], [place] Pager [phone] Office [phone]

## 2015-07-13 NOTE — Telephone Encounter (Signed)
The hospital follow up visit information was added to the A VS. He has an appt at the [facility] on 07/23/15 @1000  with Dr Jarold Song. A voice mail message was received from [facility], [place] with [facility] noting that she received the message about the patient's appointment and she would notify him of the appointment.

## 2015-07-13 NOTE — Consult Note (Signed)
   [facility] CM Inpatient Consult   07/13/2015  Seth [name] 1965-10-13 [id number]   Received voicemail from [facility] Liaison. Fortunately, she was able to make hospital follow up appointment for patient for 07/23/15 at 10 am at [facility]. Called into patient's room to make him aware. Mr. Swopes agreeable to date and time and states he will be able to make it to the appointment. Made him aware that it is appointment is listed on the AVS on his discharge paper work. Also left voicemail for him on his cell number. Made inpatient RNCM aware of appointment as well. Appreciative of assistance from [place].   Marthenia Rolling, MSN-Ed, RN,BSN [facility] Liaison [phone]

## 2015-07-13 NOTE — Care Management Important Message (Signed)
Important Message  Patient Details  Name: Seth [name] MRN: [id number] Date of Birth: 09/23/1966   Medicare Important Message Given:  Yes-third notification given    Delorse Lek 07/13/2015, 12:43 PM

## 2015-07-14 ENCOUNTER — Other Ambulatory Visit: Payer: Self-pay

## 2015-07-14 LAB — CBC
HCT: 23.9 % — ABNORMAL LOW (ref 39.0–52.0)
HEMOGLOBIN: 8 g/dL — AB (ref 13.0–17.0)
MCH: 32.4 pg (ref 26.0–34.0)
MCHC: 33.5 g/dL (ref 30.0–36.0)
MCV: 96.8 fL (ref 78.0–100.0)
Platelets: 325 10*3/uL (ref 150–400)
RBC: 2.47 MIL/uL — AB (ref 4.22–5.81)
RDW: 15.9 % — ABNORMAL HIGH (ref 11.5–15.5)
WBC: 7.6 10*3/uL (ref 4.0–10.5)

## 2015-07-14 LAB — GLUCOSE, CAPILLARY
GLUCOSE-CAPILLARY: 144 mg/dL — AB (ref 65–99)
GLUCOSE-CAPILLARY: 151 mg/dL — AB (ref 65–99)

## 2015-07-14 LAB — PROTIME-INR
INR: 1.32 (ref 0.00–1.49)
PROTHROMBIN TIME: 16.5 s — AB (ref 11.6–15.2)

## 2015-07-14 LAB — APTT: APTT: 45 s — AB (ref 24–37)

## 2015-07-14 LAB — LACTATE DEHYDROGENASE: LDH: 276 U/L — AB (ref 98–192)

## 2015-07-14 MED ORDER — CEFAZOLIN SODIUM-DEXTROSE 2-3 GM-% IV SOLR: 2.0000 g | INTRAVENOUS | Status: DC

## 2015-07-14 MED ORDER — PREDNISONE 20 MG PO TABS: 40.0000 mg | ORAL_TABLET | Freq: Every day | ORAL | Status: DC

## 2015-07-14 MED ORDER — ALBUTEROL SULFATE (2.5 MG/3ML) 0.083% IN NEBU: 2.5000 mg | INHALATION_SOLUTION | Freq: Four times a day (QID) | RESPIRATORY_TRACT | Status: DC | PRN

## 2015-07-14 MED ORDER — DARBEPOETIN ALFA 150 MCG/0.3ML IJ SOSY: 150.0000 ug | PREFILLED_SYRINGE | INTRAMUSCULAR | Status: DC

## 2015-07-14 NOTE — Progress Notes (Signed)
Subjective: Interval History: has no complaint .  Objective: Vital signs in last 24 hours: Temp:  [97.5 F (36.4 C)-98.2 F (36.8 C)] 97.5 F (36.4 C) (10/04 0531) Pulse Rate:  [81-96] 86 (10/04 0531) Resp:  [17-20] 17 (10/04 0531) BP: (121-160)/(77-88) 135/77 mmHg (10/04 0531) SpO2:  [98 %-100 %] 100 % (10/04 0531) Weight change:   Intake/Output from previous day: 10/03 0701 - 10/04 0700 In: 720 [P.O.:720] Out: 4000  Intake/Output this shift:    General appearance: alert, cooperative, no distress and morbidly obese Resp: diminished breath sounds bilaterally Cardio: S1, S2 normal and systolic murmur: holosystolic 2/6, blowing at apex GI: massive , umb hernia, soft Extremities: edema 2+  Lab Results:  Recent Labs  07/13/15 0130 07/14/15 0807  WBC 10.8* 7.6  HGB 8.6* 8.0*  HCT 25.3* 23.9*  PLT 370 325   BMET:  Recent Labs  07/11/15 1051 07/12/15 0345  NA 131* 130*  K 4.2 4.3  CL 91* 91*  CO2 27 25  GLUCOSE 183* 154*  BUN 45* 59*  CREATININE 7.35* 8.93*  CALCIUM 9.1 8.8*   No results for input(s): PTH in the last 72 hours. Iron Studies: No results for input(s): IRON, TIBC, TRANSFERRIN, FERRITIN in the last 72 hours.  Studies/Results: No results found.  I have reviewed the patient's current medications.  Assessment/Plan: 1 ESRD HD MWF needs longer HD and more UF. Will plan in am 2 HTN lower with lower vol. 3 DM per primary 4 Anemia pos Cold agglut, on ESA 5 Obesity 6 OSA CPAP 7 Staph sepsis P HD in am, Ancef at end of HD, d/c when ok with primary,    LOS: 11 days   Taira Knabe L 07/14/2015,10:21 AM

## 2015-07-14 NOTE — Discharge Summary (Signed)
Name: Seth [name] MRN: [id number] DOB: [birth year] 49 y.o. PCP: No primary care Seth [name] on file.  Date of Admission: 07/03/2015 12:15 PM Date of Discharge: 07/14/2015 Attending Physician: Michel Bickers, MD  Discharge Diagnosis: 1. Bacteremia due to Staphylococcus lugdunensis 2. 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     amLODipine 10 MG tablet  Commonly known as:  NORVASC  Take 1 tablet (10 mg total) by mouth daily.     aspirin EC 81 MG tablet  Take 81 mg by mouth daily.     calcium acetate 667 MG capsule  Commonly known as:  PHOSLO  Take 2 capsules (1,334 mg total) by mouth 3 (three) times daily with meals.     ceFAZolin 2-3 GM-% Solr  Commonly known as:  ANCEF  Inject 50 mLs (2 g total) into the vein every Monday, Wednesday, and Friday with hemodialysis.     Darbepoetin Alfa 150 MCG/0.3ML Sosy injection  Commonly known as:  ARANESP  Inject 0.3 mLs (150 mcg total) into the vein every Monday with hemodialysis.     diazepam 2 MG tablet  Commonly known as:  VALIUM  Take 1 tablet (2 mg total) by mouth every 8 (eight) hours as needed for muscle spasms.     insulin aspart 100 UNIT/ML injection  Commonly known as:  novoLOG  Inject 0-15 Units into the skin 3 (three) times daily with meals. CBG < 70: drink orange juice and recheck glucose or use glucose tabs CBG > 400: call MD    CBG 70 - 120: 0 units CBG 121 - 150: 3 units CBG 151 - 200: 4 units CBG 201 - 250: 7 units CBG 251 - 300: 11 units CBG 301 - 350: 15 units CBG 351 - 400: 20 units     predniSONE 20 MG tablet  Commonly known as:  DELTASONE  Take 2 tablets (40 mg total) by mouth daily.     Vitamin D (Ergocalciferol) 50000 UNITS Caps capsule  Commonly known as:  DRISDOL  Take 1 capsule (50,000 Units total) by mouth every 7 (seven) days. *takes on Friday*        Disposition and follow-up:   [name] was discharged from [facility] in Good condition.  At the hospital follow up visit please address:  1.  Anemia, physical therapy  2.  Labs / imaging needed at time of follow-up: CBC, reticulocyte count, LDH  3.  Pending labs/ test needing follow-up: none  Follow-up Appointments:     Follow-up Information    Follow up with Jule Ser, DO. Go on 07/20/2015 at 3:30 PM.   Specialty:  Internal Medicine   Why:  anemia, CBC, retic, LDH   Contact information:   [address] [place] [address] [phone]       Follow up with [facility]  On 07/15/2015.   Why:  for dialysis      Discharge Instructions: Discharge Instructions    AMB Referral to [facility]    Complete by:  As directed   Please assign to [place] and [facility] PharmD. [facility] RNCM for care coordination and disease management for DM. Consents signed. Please assign for PharmD as patient reports he has issues has affording meds. Also has [facility]. Goes to HD on M,W,F. Goes to [facility] on [address]. Please call with questions. Likely dc over the weekend. Thanks. Marthenia Rolling, [place], RN,BSN-[facility] Liaison-[phone]  Reason for consult:  Please assign to [place] and [facility] Pharm D  Diagnoses of:  Diabetes  Expected date of contact:  1-3 days (reserved for hospital discharges)     Call MD for:  difficulty breathing, headache or visual disturbances    Complete by:  As directed       Call MD for:  persistant dizziness or light-headedness    Complete by:  As directed      DME Nebulizer machine    Complete by:  As directed      Diet - low sodium heart healthy    Complete by:  As directed      Increase activity slowly    Complete by:  As directed            Consultations: Treatment Team:  Roney Jaffe, MD Annia Belt, MD  Procedures Performed:  Dg Orthopantogram  07/07/2015   CLINICAL DATA:  Decaying teeth.  Cardiac procedure tomorrow.  EXAM: ORTHOPANTOGRAM/PANORAMIC  COMPARISON:  CT 12/17/2005  FINDINGS: Mandible demonstrates no evidence of fracture. There is degenerative change of the left temporomandibular joint. There is poor dentition with multiple dental caries as well as multiple missing upper and lower teeth. There is a 1.1 cm oval calcification just below the angle of the right mandible known to be within the superior aspect of the right mandibular gland on previous CT scan.  IMPRESSION: Poor dentition with multiple dental caries and multiple missing upper and lower teeth.  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  1.1 cm calcification over the superior aspect of the right mandibular gland.   Electronically Signed   By: Marin Olp M.D.   On: 07/07/2015 17:09   Dg Chest 2 View  07/09/2015   CLINICAL DATA:  Cough, shortness of breath, hypertension, diabetes mellitus, sleep apnea  EXAM: CHEST  2 VIEW  COMPARISON:  07/03/2015  FINDINGS: Mild enlargement of cardiac silhouette.  Mediastinal contours and pulmonary vascularity normal.  Subsegmental atelectasis at RIGHT lung base versus cysts.  No acute infiltrate, pleural effusion, or pneumothorax.  Chronic compression deformity of the anterior aspect of an upper thoracic vertebral body appears unchanged  IMPRESSION: Persistent RIGHT basilar atelectasis.  Mild enlargement of cardiac silhouette.   Electronically Signed   By: Lavonia Dana M.D.   On: 07/09/2015 12:59   Dg Chest 2  View  07/03/2015   CLINICAL DATA:  Dry cough for approximately 10 days. Hypotension after dialysis. Tremors today.  EXAM: CHEST  2 VIEW  COMPARISON:  PA and lateral chest 06/24/2015.  FINDINGS: Right basilar atelectasis seen on the comparison examination has improved. The left lung is clear. Heart size is upper normal. No pneumothorax or pleural effusion.  IMPRESSION: No acute disease.  Improved right basilar atelectasis.   Electronically Signed   By: Inge Rise M.D.   On: 07/03/2015 12:40   Dg Chest 2 View  06/24/2015   CLINICAL DATA:  Cough, body aches  EXAM: CHEST  2 VIEW  COMPARISON:  05/01/2015  FINDINGS: Cardiomediastinal silhouette is stable. There is streaky right basilar atelectasis or early infiltrate. No pulmonary edema. Mild degenerative changes thoracic spine.  IMPRESSION: Streaky right basilar atelectasis or infiltrate. No pulmonary edema. Degenerative changes thoracic spine.   Electronically Signed   By: Lahoma Crocker M.D.   On: 06/24/2015 15:08   Dg Lumbar Spine 2-3 Views  06/17/2015   CLINICAL DATA:  Low back pain  EXAM: LUMBAR SPINE - 2-3 VIEW  COMPARISON:  None.  FINDINGS: Grade 1/2 slip L4-5 with disc and facet degeneration. No pars defect. Remaining alignment is normal. Remaining disc spaces are intact. Negative for fracture or mass.  IMPRESSION: Grade 1/2 slip at L4-5 due to disc and facet degeneration. Otherwise negative.   Electronically Signed   By: Franchot Gallo M.D.   On: 06/17/2015 15:20    2D Echo: none  Cardiac Cath: none  Admission HPI: Mr. ASHFORD MEDCALF is a 49 y.o. AA male with past medical history of DM type II, HTN, ESRD with MWF HD, and OSA who presents to the ED with 2.5 weeks of feeling bad with what he describes as cold like symptoms, runny nose. Denies any fever. Also states that since this time he has had a productive cough with clear-like mucus. Denies any blood in his sputum. States all these symptoms began around the time he got his flu shot.  Today, he was feeling more sluggish than normal as well as dizzy, lightheaded, and short of breath. He did have HD today and states they took off an extra 2 kilos from his dry weight. He has been taking Azithromycin since Monday for these symptoms that he received at his dialysis appointment. States that he thinks it has helped. Did not take dose today which was to be his last dose. Denies any chest pain, fever, chills, n/v/d, urinary complaints. Only symptoms are as described above.  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 Kilday is a 48 y. M with PMH significant for DMII, HTN, ESRD HD MWF, and OSA who presented to the ED on 9/23 with shortness of breath and reported 2.5 week history of malaise, cough, and rhinorrhea.   Acute worsening of Chronic Anemia: stable.  Hgb 7.5 on admission down from baseline Hgb of 11.3.  Haptoglobin low with elevated LDH, consistent with hemolysis.  Patient required 5 U pRBCs before Hgb stabilizing at ~8.  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 EBV, mycoplasma, and hepatitis C antibody tests were negative.  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  However, it appears to be a transient insult resulting in low level hemolysis.  Patient remained asymptomatically with regard to his anemia during hospitalization.  Patient received EPO on 9/26. Will need to continue EPO shots weekly at HD if Hgb is <10.   Staph Lugdunesis Bacteremia: Improving. Likely cause of leukocytosis. Complicated bacteremia as  source is unknown, will treat x 2 weeks with cefazolin 2g MWF at HD (9/24 -  10/11).  TTE and TEE negative for vegetation.   Cough: resolved. Likely viral bronchitis.  Patient s/p Azithromycin, strep pneumo, mycoplasma, legionella negative. Pt treated with duonebs, Tussionex, and Duonebs. Norco qhs. CXR 9/29 shows persistent atelectasis.  DMII: stable. Hyperglycemic during admission. SSI, Lantus 10 units qHs. Novolog 4 Units TID w meals. HbA1c obtained at admission 8.0.  ESRD: Dialysis MWF inpatient, will resume at [facility] outpatient. Patient will receive 2g Ancef during dialysis. Patient should receive EPO shots weekly at dialysis if Hgb < 10.  Discharge Vitals:   BP 137/99 mmHg  Pulse 90  Temp(Src) 98.8 F (37.1 C) (Oral)  Resp 20  Ht 5\' 8"  (1.727 m)  Wt 147.102 kg (324 lb 4.8 oz)  BMI 49.32 kg/m2  SpO2 99%  Discharge Labs:  Results for orders placed or performed during the hospital encounter of 07/03/15 (from the past 24 hour(s))  Glucose, capillary     Status: Abnormal   Collection Time: 07/13/15  4:39 PM  Result Value Ref Range   Glucose-Capillary 163 (H) 65 - 99 mg/dL  Glucose, capillary     Status: Abnormal   Collection Time: 07/13/15  9:17 PM  Result Value Ref Range   Glucose-Capillary 140 (H) 65 - 99 mg/dL  Glucose, capillary     Status: Abnormal   Collection Time: 07/14/15  7:46 AM  Result Value Ref Range   Glucose-Capillary 144 (H) 65 - 99 mg/dL  CBC     Status: Abnormal   Collection Time: 07/14/15  8:07 AM  Result Value Ref Range   WBC 7.6 4.0 - 10.5 K/uL   RBC 2.47 (L) 4.22 - 5.81 MIL/uL   Hemoglobin 8.0 (L) 13.0 - 17.0 g/dL   HCT 23.9 (L) 39.0 - 52.0 %   MCV 96.8 78.0 - 100.0 fL   MCH 32.4 26.0 - 34.0 pg   MCHC 33.5 30.0 - 36.0 g/dL   RDW 15.9 (H) 11.5 - 15.5 %   Platelets 325 150 - 400 K/uL  APTT     Status: Abnormal   Collection Time: 07/14/15  8:07 AM  Result Value Ref Range   aPTT 45 (H) 24 - 37 seconds  Protime-INR     Status: Abnormal    Collection Time: 07/14/15  8:07 AM  Result Value Ref Range   Prothrombin Time 16.5 (H) 11.6 - 15.2 seconds   INR 1.32 0.00 - 1.49  Lactate dehydrogenase     Status: Abnormal   Collection Time: 07/14/15  8:07 AM  Result Value Ref Range   LDH 276 (H) 98 - 192 U/L  Glucose, capillary     Status: Abnormal   Collection Time: 07/14/15 11:46 AM  Result Value Ref Range   Glucose-Capillary 151 (H) 65 - 99 mg/dL    Signed: Iline Oven, MD 07/14/2015, 1:41 PM    Services Ordered on Discharge: [place] on Discharge: Rolling walker with 5" wheels; 3in1 bariatric equipment

## 2015-07-14 NOTE — Progress Notes (Signed)
Physical Therapy Treatment Patient Details Name: Seth [name] MRN: [id number] DOB: May 28, 1966 Today's Date: 07/14/2015    History of Present Illness Patient is a 49 yo male admitted 07/03/15 with cough, SOB.  Patient with bacteremia (staph lugdunensis), anemia, hyperkalemia.    PMH:  DM type II, HTN, ESRD with MWF HD, anemia, morbid obesity, and OSA     PT Comments    Very good improvements in activity tolerance and endurance; Tony's confidence is back as well; Anticipate continuing progress after dc as well  Follow Up Recommendations  Home health PT;Supervision for mobility/OOB     Equipment Recommendations  Rolling walker with 5" wheels;3in1 (PT) (Bariatric equipment)    Recommendations for Other Services OT consult     Precautions / Restrictions Precautions Precaution Comments: Fall risk greatly reduced with use of RW Restrictions Weight Bearing Restrictions: No    Mobility  Bed Mobility               General bed mobility comments: Patient sitting in bari recliner  Transfers Overall transfer level: Needs assistance Equipment used: Rolling walker (2 wheeled) (Bariatric) Transfers: Sit to/from Stand Sit to Stand: Min guard         General transfer comment: Continued dependence on UE assist to stand; did not need anyone to steady RW  Ambulation/Gait Ambulation/Gait assistance: Min guard Ambulation Distance (Feet): 110 Feet Assistive device: Rolling walker (2 wheeled) Gait Pattern/deviations: Step-through pattern     General Gait Details: Cues to self- monitor for activity tolerance   Stairs            Wheelchair Mobility    Modified Rankin (Stroke Patients Only)       Balance Overall balance assessment: Needs assistance           Standing balance-Leahy Scale: Poor (approaching Fair)                      Cognition Arousal/Alertness: Awake/alert Behavior During Therapy: WFL for tasks assessed/performed Overall Cognitive  Status: Within Functional Limits for tasks assessed                      Exercises Other Exercises Other Exercises:  Provided Nicole Kindred with level 3 Theraband, and he performed bicep curls, chest presses, bil shoulder retraction (horizontal and diagonals, tricep extensions;  Return demonstrated well, reported he felt better after UE therex    General Comments        Pertinent Vitals/Pain Pain Assessment: No/denies pain    Home Living                      Prior Function            PT Goals (current goals can now be found in the care plan section) Acute Rehab PT Goals Patient Stated Goal: To get stronger PT Goal Formulation: With patient Time For Goal Achievement: 07/18/15 Potential to Achieve Goals: Good Progress towards PT goals: Progressing toward goals (will likely meet goals next session, consider upgrading goal)    Frequency  Min 3X/week    PT Plan Current plan remains appropriate    Co-evaluation             End of Session Equipment Utilized During Treatment: Gait belt Activity Tolerance: Patient tolerated treatment well Patient left: in chair;with call bell/phone within reach     Time: 1020-1050 PT Time Calculation (min) (ACUTE ONLY): 30 min  Charges:  $Gait Training: 8-22 mins $Therapeutic  Exercise: 8-22 mins                    G Codes:      Roney Marion [facility] 07/14/2015, 12:59 PM  Roney Marion, [place] Pager [phone] Office [phone]

## 2015-07-14 NOTE — Progress Notes (Addendum)
NURSING PROGRESS NOTE  Seth [name] [id number] Discharge Data: 07/14/2015 3:52 PM Attending Provider: Michel Bickers, MD PCP:No primary care provider on file.   [name] to be D/C'd Home per MD order via wheelchair by [facility]. All prescriptions sent to patient preferred pharmacy, patient verbalized understanding of attending follow up appointments and adherence to his medications. Patient refused flu and pneumonia vaccines.    All IV's will be discontinued and monitored for bleeding.  All belongings will be returned to patient for patient to take home.  Last Documented Vital Signs:  Blood pressure 137/99, pulse 90, temperature 98.8 F (37.1 C), temperature source Oral, resp. rate 20, height 5\' 8"  (1.727 m), weight 147.102 kg (324 lb 4.8 oz), SpO2 99 %.  Hendricks Limes RN, BS, BSN

## 2015-07-14 NOTE — Patient Outreach (Signed)
This RNCM was successful in contacting patient by telephone. Patient identified himself using HIPPA identifiers by providing date of birth and address. Patient advised this RNCM he was still a patient in [facility], ancipated discharge date is tomorrow, October 5.  This RNCM and patient agreed to make telephone contact on Friday, October 7 for [facility] Coordination assessment.

## 2015-07-14 NOTE — Discharge Instructions (Signed)
1. Continue physical therapy 2. Follow up with Dr. Juleen China on 07/20/15 at 3:30 pm at the Internal Medicine Clinic on the ground floor of [facility] 3. Go to dialysis tomorrow at [facility]. 4. Avoid flu shots in the future.

## 2015-07-14 NOTE — Progress Notes (Addendum)
Subjective: NAEON. Patient denies complaints.  He ambulated with PT today and enjoyed it.  He believes he is safe for discharge and understands to follow up.    Objective: Vital signs in last 24 hours: Filed Vitals:   07/13/15 1742 07/13/15 2121 07/14/15 0531 07/14/15 1000  BP: 125/87 121/80 135/77 137/99  Pulse: 90 92 86 90  Temp: 98.2 F (36.8 C) 98.2 F (36.8 C) 97.5 F (36.4 C) 98.8 F (37.1 C)  TempSrc: Oral Oral Oral Oral  Resp: 20 18 17 20   Height:      Weight:      SpO2: 98% 98% 100% 99%   Weight change:   Intake/Output Summary (Last 24 hours) at 07/14/15 1327 Last data filed at 07/14/15 0900  Gross per 24 hour  Intake    840 ml  Output      0 ml  Net    840 ml   Physical Exam  Constitutional: He is oriented to person, place, and time and well-developed, well-nourished, and in no distress. No distress.  Morbidly obese, sitting upright in chair.  Pleasant, in NAD  HENT:  Head: Normocephalic and atraumatic.  Eyes: EOM are normal. No scleral icterus.  Neck: No JVD present. No tracheal deviation present.  Cardiovascular: Normal rate, regular rhythm and normal heart sounds.   Heart sounds distant; no adventitious sounds appreciated  Pulmonary/Chest: Effort normal. No respiratory distress. He has no wheezes.  No wheezes appreciated.  Abdominal: Soft. He exhibits no distension. There is no tenderness. There is no rebound and no guarding.  Musculoskeletal: He exhibits no edema.  Neurological: He is alert and oriented to person, place, and time.  Ambulating with walker with PT.  Skin: Skin is warm and dry. No rash noted. He is not diaphoretic.    Lab Results: Basic Metabolic Panel:  Recent Labs Lab 07/08/15 0837  07/11/15 1051 07/12/15 0345  NA 125*  < > 131* 130*  K 3.9  < > 4.2 4.3  CL 87*  < > 91* 91*  CO2 20*  < > 27 25  GLUCOSE 289*  < > 183* 154*  BUN 88*  < > 45* 59*  CREATININE 9.84*  < > 7.35* 8.93*  CALCIUM 8.7*  < > 9.1 8.8*  PHOS 7.2*  --    --   --   < > = values in this interval not displayed. Liver Function Tests:  Recent Labs Lab 07/08/15 0837 07/10/15 1514  BILITOT  --  1.3*  ALBUMIN 3.3*  --    No results for input(s): LIPASE, AMYLASE in the last 168 hours. No results for input(s): AMMONIA in the last 168 hours. CBC:  Recent Labs Lab 07/10/15 1514  07/11/15 1051 07/13/15 0130 07/14/15 0807  WBC 11.1*  < > 11.1* 10.8* 7.6  NEUTROABS 8.2*  --  8.3*  --   --   HGB 7.0*  < > 7.8* 8.6* 8.0*  HCT 19.6*  < > 23.0* 25.3* 23.9*  MCV 92.0  < > 94.3 94.4 96.8  PLT 414*  < > 384 370 325  < > = values in this interval not displayed.CBC drawn for smear review shows Hgb 7.8. Smear review with Dr. Beryle Beams (10/2): 1+ spherocytes 1+ polychromasia Rare schistocytes Normal platelets No Rouleaux Mature PMNs and lymphocytes  Cardiac Enzymes: No results for input(s): CKTOTAL, CKMB, CKMBINDEX, TROPONINI in the last 168 hours. BNP: No results for input(s): PROBNP in the last 168 hours. D-Dimer: No results for input(s): DDIMER  in the last 168 hours. CBG:  Recent Labs Lab 07/13/15 0737 07/13/15 1334 07/13/15 1639 07/13/15 2117 07/14/15 0746 07/14/15 1146  GLUCAP 163* 164* 163* 140* 144* 151*   Hemoglobin A1C: No results for input(s): HGBA1C in the last 168 hours. Fasting Lipid Panel: No results for input(s): CHOL, HDL, LDLCALC, TRIG, CHOLHDL, LDLDIRECT in the last 168 hours. Thyroid Function Tests: No results for input(s): TSH, T4TOTAL, FREET4, T3FREE, THYROIDAB in the last 168 hours. Coagulation:  Recent Labs Lab 07/12/15 0345 07/14/15 0807  LABPROT 15.9* 16.5*  INR 1.25 1.32   Anemia Panel: No results for input(s): VITAMINB12, FOLATE, FERRITIN, TIBC, IRON, RETICCTPCT in the last 168 hours. Urine Drug Screen: Drugs of Abuse  No results found for: LABOPIA, COCAINSCRNUR, LABBENZ, AMPHETMU, THCU, LABBARB  Alcohol Level: No results for input(s): ETH in the last 168 hours. Urinalysis: No results  for input(s): COLORURINE, LABSPEC, PHURINE, GLUCOSEU, HGBUR, BILIRUBINUR, KETONESUR, PROTEINUR, UROBILINOGEN, NITRITE, LEUKOCYTESUR in the last 168 hours.  Invalid input(s): APPERANCEUR Misc. Labs: 07/12/2015 CBC drawn for smear review shows Hgb 7.8. Smear review with Dr. Beryle Beams: 1+ spherocytes 1+ polychromasia Rare schistocytes Normal platelets No Rouleaux Mature PMNs and lymphocytes  07/14/15: Send off blood tests for anti-P antibodies Previous anti-I antibodies present Cold auto-antibodies present No allo-antibodies observed No Anti-P antibodies  Micro Results: Recent Results (from the past 240 hour(s))  Culture, blood (routine x 2)     Status: None   Collection Time: 07/04/15  1:30 PM  Result Value Ref Range Status   Specimen Description BLOOD DIALYSIS  Final   Special Requests BOTTLES DRAWN AEROBIC AND ANAEROBIC 10CC  Final   Culture  Setup Time   Final    GRAM POSITIVE COCCI IN CLUSTERS AEROBIC BOTTLE ONLY CRITICAL RESULT CALLED TO, READ BACK BY AND VERIFIED WITH: A MINTZ 07/05/15 @ [place]  Final   Report Status 07/07/2015 FINAL  Final   Organism ID, Bacteria STAPHYLOCOCCUS LUGDUNENSIS  Final      Susceptibility   Staphylococcus lugdunensis - MIC*    CIPROFLOXACIN <=0.5 SENSITIVE Sensitive     ERYTHROMYCIN <=0.25 SENSITIVE Sensitive     GENTAMICIN <=0.5 SENSITIVE Sensitive     OXACILLIN 0.5 SENSITIVE Sensitive     TETRACYCLINE <=1 SENSITIVE Sensitive     VANCOMYCIN <=0.5 SENSITIVE Sensitive     TRIMETH/SULFA <=10 SENSITIVE Sensitive     CLINDAMYCIN <=0.25 SENSITIVE Sensitive     RIFAMPIN <=0.5 SENSITIVE Sensitive     Inducible Clindamycin NEGATIVE Sensitive     * STAPHYLOCOCCUS LUGDUNENSIS  Culture, blood (routine x 2)     Status: None   Collection Time: 07/04/15  1:45 PM  Result Value Ref Range Status   Specimen Description BLOOD DIALYSIS  Final   Special Requests BOTTLES DRAWN AEROBIC AND ANAEROBIC 10CC  Final     Culture  Setup Time   Final    GRAM POSITIVE COCCI IN CLUSTERS AEROBIC BOTTLE ONLY CONFIRMED BY V WILKINS CRITICAL RESULT CALLED TO, READ BACK BY AND VERIFIED WITH: K HAYWOOD 07/06/15 @1114  M VESTAL    Culture   Final    STAPHYLOCOCCUS LUGDUNENSIS SUSCEPTIBILITIES PERFORMED ON PREVIOUS CULTURE WITHIN THE LAST 5 DAYS.    Report Status 07/07/2015 FINAL  Final  Culture, blood (routine x 2)     Status: None   Collection Time: 07/07/15  2:35 PM  Result Value Ref Range Status   Specimen Description BLOOD LEFT ANTECUBITAL  Final   Special Requests BOTTLES DRAWN AEROBIC  ONLY  5CC  Final   Culture NO GROWTH 5 DAYS  Final   Report Status 07/12/2015 FINAL  Final  Culture, blood (routine x 2)     Status: None   Collection Time: 07/07/15  3:00 PM  Result Value Ref Range Status   Specimen Description BLOOD LEFT HAND  Final   Special Requests IN PEDIATRIC BOTTLE [place]  Final   Culture NO GROWTH 5 DAYS  Final   Report Status 07/12/2015 FINAL  Final   Studies/Results: No results found. Medications: I have reviewed the patient's current medications. Scheduled Meds: . amLODipine  5 mg Oral QHS  . aspirin EC  81 mg Oral Daily  . benzonatate  200 mg Oral TID  . calcium acetate  1,334 mg Oral TID WC  .  ceFAZolin (ANCEF) IV  2 g Intravenous Q M,W,F-HD  . chlorpheniramine-HYDROcodone  5 mL Oral Q12H  . darbepoetin (ARANESP) injection - DIALYSIS  150 mcg Intravenous Q Mon-HD  . doxercalciferol  2.5 mcg Intravenous Q M,W,F-HD  . guaiFENesin  600 mg Oral BID  . heparin subcutaneous  5,000 Units Subcutaneous 3 times per day  . HYDROcodone-acetaminophen  1 tablet Oral QHS  . insulin aspart  0-5 Units Subcutaneous QHS  . insulin aspart  0-9 Units Subcutaneous TID WC  . insulin aspart  4 Units Subcutaneous TID WC  . insulin glargine  10 Units Subcutaneous QHS  . multivitamin  1 tablet Oral QHS  . pneumococcal 23 valent vaccine  0.5 mL Intramuscular Tomorrow-1000  . polyethylene glycol  17 g Oral  Daily  . 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 male with past medical history of DM type II, HTN, ESRD with MWF HD, and OSA who presents to the ED with 2.5 weeks of feeling bad with what he describes as cold like symptoms, runny nose.  Acute worsening on Chronic Anemia. stable.  Chronic anemia 2/2 CKD.  Hgb 7.0 on 9/30, now increasing to 8.6 (10/3). LDH peaked and now downtrending.  Hgb, PT, and aPTT stable.  Low grade cold hemagglutinin hemolysis. No alloantibodies, therefore, unlikely Delayed Hemolytic Transfusion Reaction. - LDH high (now downtrending), indirect Bilirubin high, haptoglobin low, c/w hemolysis.  Cold agglutinin titers 1:512 on 9/28 (1:2048 on 9/27). Etiology still remains unclear at this time (benign cold agglutinin vs mixed AIHA vs cryoglobulins).   - Repeat DAT/IAT (10/3) shows complement but no IgG. -Hgb 7.5 on admission, down from 11.3 on 9/14.Now s/p 5 U pRBCs.  Hgb improved to 8.6 (10/3). - LDH peak 356 on 9/29.  301 (10/2).  276 (10/4). -Consult hematology on 9/26, appreciate recommendations.   - unlikely this is a delayed transfusion reaction.  - EBV and Mycoplasma IgM and IgG negative, HCV negative, likely benign   - Negative anti-P evaluation for Paroxysmal Cold Hemoglobinuria.  Staph Lugdunesis bacteremia, improving.  Cefazolin 2g every MWF with dialysis (9/27 - 10/11). -WBC at admission of 18.4 with 78%  PMNs trending down to 8.9 9/30. -Blood cultures 2/2 positive for GPC in clusters, aerobic bottles >>  Staph Lugdunensis (pan sensitive) -Will treat for 14 days with Cefazolin 2g every MWF w/ HD. Using 9/27 (repeat blood cx negative at this time) as day 1 (9/27-10/11) -Orthopantogram: poor dentition with multiple dental caries and multiple missing and upper lower teeth. Degenerative changes of the left TMJ. Recommend dental follow up -TEE today w/ EF 60%, no evidence of thrombus or vegetation  Diabetes mellitus -sliding scale insulin, Lantus 10 units qhs, Novolog 4units TID with meals -Hgb A1c 8.0  ESRD on M,W,F HD at [facility] recs. - HD today inpatient, will continue MWF   Hypertension - Increase to Amlodipine 10mg  daily.   Hyponatremia, chronic  -likely due to hyperglycemia combined with possible increased volume from renal disease -continue to monitor  Cough, likely viral bronchitis. resolved - S/p Azithromycin x5 days outpatient -chest x-ray on admission compared to 9/14 shows improved right bibasilar atelectasis. Repeat CXR yesterday shows persistent right basilar atelectasis. -incentive spirometry -strep pneumo negative, legionella negative, mycoplasma negative - continue with supportive care with Tussionex q12h, Mucinex 600mg  PO BID, Tessalon 200mg  TID, Duonebs q4h prn and Norco qhs  Hyperkalemia, resolved with kayex + HD -potassium was found to be 6.6 initially. Improved with kayex and also HD.   OSA -CPAP nightly   Dispo: Disposition is deferred at this time, awaiting improvement of current medical problems. Possible discharge in 1 day.  The patient does have a current PCP (Tresa Garter, MD) and does not need an [facility] hospital follow-up appointment after discharge.  The patient does not have transportation limitations that hinder transportation to clinic appointments.  Patient will need Home Health Physical Therapy and  Supervision for mobility/OOB   LOS: 11 days   Iline Oven, MD 07/14/2015, 1:27 PM

## 2015-07-14 NOTE — Care Management Note (Addendum)
Case Management Note  Patient Details  Name: Seth [name] MRN: [id number] Date of Birth: 1966/04/27  Subjective/Objective:     CM following for progression and d/c planning.               Action/Plan: 07/14/2015 Met with pt re d/c needs, pt given HH choices, [facility] selected with services to begin tomorrow 07/15/2015. Wide rolling walker and wide 3:1 ordered from [facility] for delivery to pt room.   Expected Discharge Date:  07/14/2015              Expected Discharge Plan:  [place]  In-House Referral:  NA  Discharge planning Services   [facility] has seen this pt and will follow in the community, pt is also a veteran and is seen at [facility], however plans to change to [facility]. [facility] has assisted this pt with a followup appointment at [place] for care during his transition to [place] and a local PCP. Pt can fill current prescriptions at [facility] as he has an appointment scheduled at that facility. He will later move prescriptions to the [place], however due to the delay with the [facility] he will need to obtain his own scripts at the time of d/c.  07/14/2015 2pm Notified by Dr Varney Daily that this pt will be followed by Internal Medicine and that appointment at [facility] has been cancelled and he has been scheduled at [facility]. Post Acute Care Choice:  Durable Medical Equipment Choice offered to:  Patient  DME Arranged:  3-N-1, Walker wide DME Agency:  [place] Arranged:  RN, PT, OT [facility] Agency:  Other - See comment  Status of Service:  Completed, signed off  Medicare Important Message Given:  Yes-third notification given Date Medicare IM Given:    Medicare IM give by:    Date Additional Medicare IM Given:    Additional Medicare Important Message give by:     If discussed at [place] of Stay Meetings, dates discussed:    Additional Comments: Pt  HHRN, PT and OT to be provided by [facility], with  services to begin tomorrow , 07/15/2015 per agency rep.   Elsworth Ledin, Rory Percy, RN 07/14/2015, 10:52 AM

## 2015-07-14 NOTE — Progress Notes (Signed)
[name], male   DOB: 03/22/1966, 49 y.o.   MRN: [id number]  Date: 07/14/2015  Patient name: Seth [name]  Medical record number: [id number]  Date of birth: 30-May-1966   This patient's plan of care was discussed with the house staff. Please see their note for complete details. I concur with their findings. [name] is currently feeling well and eager to go home. His hemoglobin is relatively stable at 8 and arrangements have been made for close followup with repeat CBC. 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   Michel Bickers, MD 07/14/2015, 1:59 PM

## 2015-07-15 ENCOUNTER — Other Ambulatory Visit: Payer: Self-pay | Admitting: Pharmacist

## 2015-07-15 DIAGNOSIS — D649 Anemia, unspecified: Secondary | ICD-10-CM | POA: Diagnosis not present

## 2015-07-15 DIAGNOSIS — Z794 Long term (current) use of insulin: Secondary | ICD-10-CM | POA: Diagnosis not present

## 2015-07-15 DIAGNOSIS — Z792 Long term (current) use of antibiotics: Secondary | ICD-10-CM | POA: Diagnosis not present

## 2015-07-15 DIAGNOSIS — R262 Difficulty in walking, not elsewhere classified: Secondary | ICD-10-CM | POA: Diagnosis not present

## 2015-07-15 DIAGNOSIS — I120 Hypertensive chronic kidney disease with stage 5 chronic kidney disease or end stage renal disease: Secondary | ICD-10-CM | POA: Diagnosis not present

## 2015-07-15 DIAGNOSIS — M6281 Muscle weakness (generalized): Secondary | ICD-10-CM | POA: Diagnosis not present

## 2015-07-15 DIAGNOSIS — N186 End stage renal disease: Secondary | ICD-10-CM | POA: Diagnosis not present

## 2015-07-15 DIAGNOSIS — E1165 Type 2 diabetes mellitus with hyperglycemia: Secondary | ICD-10-CM | POA: Diagnosis not present

## 2015-07-15 DIAGNOSIS — Z862 Personal history of diseases of the blood and blood-forming organs and certain disorders involving the immune mechanism: Secondary | ICD-10-CM | POA: Diagnosis not present

## 2015-07-15 DIAGNOSIS — Z992 Dependence on renal dialysis: Secondary | ICD-10-CM | POA: Diagnosis not present

## 2015-07-15 LAB — PARVOVIRUS B19 ANTIBODY, IGG AND IGM
PAROVIRUS B19 IGG ABS: 0.5 {index} (ref 0.0–0.8)
PAROVIRUS B19 IGM ABS: 0.1 {index} (ref 0.0–0.8)

## 2015-07-15 NOTE — Patient Outreach (Signed)
Seth [name] is a 49 y.o. male referred to pharmacy for medication assistance. Called and spoke with Seth [name] who states that he has Medicare and also has [employer] ([place]) benefits. Patient states that he does not have his red,white and blue Medicare card or any other Medicare cards on hand. Reports that he normally receives his prescriptions through the [place] by mail, but that he has trouble affording any medications that he needs on a more acute basis. Discuss the local [place] pharmacies from which the patient can pick up prescriptions, the [facility] and the [place].   Seth [name] reports that he was able to get all of the medications that were prescribed for him from his hospital discharge on 07/14/15 from [facility]. Patient reports that he needs to get an appointment scheduled with the [facility] for follow up. Advised patient to call the [place] today to schedule this appointment. Also advised patient to reach out to his [facility] primary care provider to let him know what new medications he is taking and request that his [place] provider prescribe these for him so that he can receive these medications from the [place] mail order as well. Patient states that he will make these phone calls to schedule his appointment and to his [place] primary care provider today when we get off of the phone.  Also spoke with Seth [name] about the Extra Help application, which could help him in the future should he have prescriptions that he needs acutely and is not able to wait to get through the [place]. Seth [name] stated that he would appreciate help with completing this application. Let him know that I will reach out to Care Management Assistant Damita Rhodie to ask her to help him with this. Provided Seth [name] with my contact information. Patient reports that he has no further medication questions for me at this time.  PLAN:  1) Will send and InBasket message to Care Management Assistant Damita Rhodie to ask her to help Seth [name]  with completing the Extra Help application  2) Let Seth [name] know that I will follow up with him in 1 month.  Harlow Asa, PharmD Clinical Pharmacist [place] [facility] [phone]

## 2015-07-16 ENCOUNTER — Other Ambulatory Visit
Admission: RE | Admit: 2015-07-16 | Discharge: 2015-07-16 | Disposition: A | Discharge status: Home / Self Care | Payer: Medicare Other | Source: Ambulatory Visit | Attending: Internal Medicine | Admitting: Internal Medicine

## 2015-07-16 DIAGNOSIS — N186 End stage renal disease: Secondary | ICD-10-CM | POA: Insufficient documentation

## 2015-07-16 DIAGNOSIS — M6281 Muscle weakness (generalized): Secondary | ICD-10-CM | POA: Diagnosis not present

## 2015-07-16 DIAGNOSIS — R262 Difficulty in walking, not elsewhere classified: Secondary | ICD-10-CM | POA: Diagnosis not present

## 2015-07-16 DIAGNOSIS — I120 Hypertensive chronic kidney disease with stage 5 chronic kidney disease or end stage renal disease: Secondary | ICD-10-CM | POA: Diagnosis not present

## 2015-07-16 DIAGNOSIS — D649 Anemia, unspecified: Secondary | ICD-10-CM | POA: Diagnosis not present

## 2015-07-16 DIAGNOSIS — E1165 Type 2 diabetes mellitus with hyperglycemia: Secondary | ICD-10-CM | POA: Diagnosis not present

## 2015-07-17 ENCOUNTER — Telehealth: Payer: Self-pay | Admitting: *Deleted

## 2015-07-17 ENCOUNTER — Other Ambulatory Visit: Payer: Self-pay

## 2015-07-17 DIAGNOSIS — E1165 Type 2 diabetes mellitus with hyperglycemia: Secondary | ICD-10-CM | POA: Diagnosis not present

## 2015-07-17 DIAGNOSIS — I120 Hypertensive chronic kidney disease with stage 5 chronic kidney disease or end stage renal disease: Secondary | ICD-10-CM | POA: Diagnosis not present

## 2015-07-17 DIAGNOSIS — M6281 Muscle weakness (generalized): Secondary | ICD-10-CM | POA: Diagnosis not present

## 2015-07-17 DIAGNOSIS — R262 Difficulty in walking, not elsewhere classified: Secondary | ICD-10-CM | POA: Diagnosis not present

## 2015-07-17 DIAGNOSIS — N186 End stage renal disease: Secondary | ICD-10-CM | POA: Diagnosis not present

## 2015-07-17 DIAGNOSIS — D649 Anemia, unspecified: Secondary | ICD-10-CM | POA: Diagnosis not present

## 2015-07-17 NOTE — Patient Outreach (Signed)
This RNCM reminded patient he has an appointment on October 13 at 10am at [facility].

## 2015-07-17 NOTE — Telephone Encounter (Signed)
Transition Care Management Follow-up Telephone Call   Date discharged?10/4   How have you been since you were released from the hospital? good   Do you understand why you were in the hospital? yes   Do you understand the discharge instructions? yes   Where were you discharged to? home   Items Reviewed:  Medications reviewed: yes  Allergies reviewed: yes  Dietary changes reviewed: yes  Referrals reviewed: yes   Functional Questionnaire:   Activities of Daily Living (ADLs):   He states they are independent in the following: fiancee assist States they require assistance with the following: fiancee assist   Any transportation issues/concerns?: no   Any patient concerns? yes, need a nebulizer machine   Confirmed importance and date/time of follow-up visits scheduled yes  Provider Appointment booked with  Confirmed with patient if condition begins to worsen call PCP or go to the ER.  Patient was given the office number and encouraged to call back with question or concerns.  : yes

## 2015-07-17 NOTE — Telephone Encounter (Signed)
Shana, when pt was called for tcm he stated "they were supposed to get me a nebulizer machine and didn't, would you look into this for him?

## 2015-07-20 ENCOUNTER — Ambulatory Visit (INDEPENDENT_AMBULATORY_CARE_PROVIDER_SITE_OTHER): Payer: Medicare Other | Admitting: Internal Medicine

## 2015-07-20 ENCOUNTER — Encounter: Payer: Self-pay | Admitting: Internal Medicine

## 2015-07-20 VITALS — BP 150/73 | HR 90 | Temp 98.2°F | Wt 310.0 lb

## 2015-07-20 DIAGNOSIS — E119 Type 2 diabetes mellitus without complications: Secondary | ICD-10-CM | POA: Diagnosis not present

## 2015-07-20 DIAGNOSIS — S29011A Strain of muscle and tendon of front wall of thorax, initial encounter: Secondary | ICD-10-CM

## 2015-07-20 DIAGNOSIS — I10 Essential (primary) hypertension: Secondary | ICD-10-CM | POA: Diagnosis not present

## 2015-07-20 DIAGNOSIS — D5912 Cold autoimmune hemolytic anemia: Secondary | ICD-10-CM

## 2015-07-20 DIAGNOSIS — N186 End stage renal disease: Secondary | ICD-10-CM

## 2015-07-20 DIAGNOSIS — E1122 Type 2 diabetes mellitus with diabetic chronic kidney disease: Secondary | ICD-10-CM

## 2015-07-20 DIAGNOSIS — B958 Unspecified staphylococcus as the cause of diseases classified elsewhere: Secondary | ICD-10-CM

## 2015-07-20 DIAGNOSIS — D591 Other autoimmune hemolytic anemias: Secondary | ICD-10-CM | POA: Diagnosis not present

## 2015-07-20 DIAGNOSIS — Z794 Long term (current) use of insulin: Secondary | ICD-10-CM | POA: Diagnosis not present

## 2015-07-20 DIAGNOSIS — E1110 Type 2 diabetes mellitus with ketoacidosis without coma: Secondary | ICD-10-CM | POA: Insufficient documentation

## 2015-07-20 DIAGNOSIS — R7881 Bacteremia: Secondary | ICD-10-CM

## 2015-07-20 DIAGNOSIS — Z992 Dependence on renal dialysis: Secondary | ICD-10-CM

## 2015-07-20 LAB — GLUCOSE, CAPILLARY: Glucose-Capillary: 155 mg/dL — ABNORMAL HIGH (ref 65–99)

## 2015-07-20 LAB — HM DIABETES EYE EXAM

## 2015-07-20 MED ORDER — NAPROXEN 500 MG PO TABS: 500.0000 mg | ORAL_TABLET | Freq: Two times a day (BID) | ORAL | Status: DC

## 2015-07-20 NOTE — Telephone Encounter (Signed)
Thank you.  I do not see an order on [name] EMR for a nebulizer.  He will need an order prior to [place].  Pt has an appointment today at [facility], I will request physician to determine medical necessity and place order if needed for nebulizer.  In addition, will need to confirm pt is establishing care at [facility], as [place] is noted as his PCP.  Thank you.

## 2015-07-20 NOTE — Patient Outreach (Signed)
[facility]) Care Management  07/20/2015  Seth [name] [birth year] [id number]   I called [name] today to enroll him into Extra Help.  He was willing to do it over the phone.  We completed the application online and submitted it today.  He should hear from social security administration in the next 2 to 4 weeks.  He is going to call me when he hears from them. I will follow up in 4 weeks.  [name] L. Maelani Yarbro, [place] Care Management Assistant

## 2015-07-20 NOTE — Progress Notes (Signed)
[name], male   DOB: [birth year], 49 y.o.   MRN: [id number]   Subjective:   [name] male   DOB: 01-31-1966 49 y.o.   MRN: [id number]  HPI: [name] is a 49 y.o. male with past medical history of DM type II, HTN, OSA, ESRD on HD (MWF) who presents to the clinic today for hospital follow up as well as to establish care with [facility].  Patient was admitted for shortness of breath and reported 2.5 week history of malaise, cough, and rhinorrhea.  He was suspected to have a viral bronchitis.  His hospital course was complicated by 1. Acute worsening of chronic anemia and 2. Staph lugdunesis bacteremia.  1.  Acute worsening of chronic anemia: Hgb 7.5 on admission down from baseline Hgb of 11.3. Haptoglobin low with elevated LDH, consistent with hemolysis. Patient required 5 U pRBCs before Hgb stabilizing at ~8. 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 EBV, mycoplasma, and hepatitis C antibody tests were negative. 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 However, it appears to be a transient insult resulting in low level hemolysis. Patient remained asymptomatic with regard to his anemia during hospitalization. Patient received EPO on 9/26. Will need to continue EPO shots weekly at HD if Hgb is <10.   2.  Staph lugdunesis bacteremia: likely the cause of his leukocytosis and malaise.  Unclear what the source of bacteremia was.  TTE and TEE were both negative for vegetations.  Patient being treated with 2 weeks of cefazolin 2g every MWF at HD (9/24-10/11).  Repeat blood cultures have been NGTD.  3.  Today, patient is complaining of chest wall pain that began during hospitalization.  States the pain is aggravated by movement (i.e. Repositioning himself in chair) and occasionally by coughing.  Pain is bilateral, reproducible with  palpation.  States pain can be relieved by stretching his chest and placing hands behind his head.  He has not tried any other remedies to relieve pain.  Denies any dyspnea on exertion, no pain at rest.  Past Medical History  Diagnosis Date  . Diabetes mellitus   . Hypertension   . Chronic kidney disease   . 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 Sleep apnea    Current Outpatient Prescriptions  Medication Sig Dispense Refill  . amLODipine (NORVASC) 10 MG tablet Take 1 tablet (10 mg total) by mouth daily. 30 tablet 0  . aspirin EC 81 MG tablet Take 81 mg by mouth daily.    . calcium acetate (PHOSLO) 667 MG capsule Take 2 capsules (1,334 mg total) by mouth 3 (three) times daily with meals. 180 capsule 0  . ceFAZolin (ANCEF) 2-3 GM-% SOLR Inject 50 mLs (2 g total) into the vein every Monday, Wednesday, and Friday with hemodialysis.    . Darbepoetin Alfa (ARANESP) 150 MCG/0.3ML SOSY injection Inject 0.3 mLs (150 mcg total) into the vein every Monday with hemodialysis. 1.68 mL 0  . insulin aspart (NOVOLOG) 100 UNIT/ML injection Inject 0-15 Units into the skin 3 (three) times daily with meals. CBG < 70: drink orange juice and recheck glucose or use glucose tabs CBG > 400: call MD   CBG 70 - 120: 0 units CBG 121 - 150: 3 units CBG 151 - 200: 4 units CBG 201 - 250: 7 units CBG  251 - 300: 11 units CBG 301 - 350: 15 units CBG 351 - 400: 20 units 1 vial 12  . Vitamin D, Ergocalciferol, (DRISDOL) 50000 UNITS CAPS capsule Take 1 capsule (50,000 Units total) by mouth every 7 (seven) days. *takes on Friday* 30 capsule 0  . albuterol (PROVENTIL) (2.5 MG/3ML) 0.083% nebulizer solution Take 3 mLs (2.5 mg total) by nebulization every 6 (six) hours as needed for wheezing or shortness of breath. (Patient not taking: Reported on 07/17/2015) 75 mL 12  . diazepam (VALIUM) 2 MG tablet Take 1 tablet (2 mg total) by mouth every 8 (eight) hours as needed for muscle  spasms. (Patient not taking: Reported on 07/17/2015) 30 tablet 0  . naproxen (NAPROSYN) 500 MG tablet Take 1 tablet (500 mg total) by mouth 2 (two) times daily with a meal. 20 tablet 0  . predniSONE (DELTASONE) 20 MG tablet Take 2 tablets (40 mg total) by mouth daily. (Patient not taking: Reported on 07/17/2015) 10 tablet 0   No current facility-administered medications for this visit.   Family History  Problem Relation Age of Onset  . Diabetes Mother   . Diabetes Sister   . Diabetes Brother    Social History   Social History  . Marital Status: Single    Spouse Name: N/A  . Number of Children: N/A  . Years of Education: N/A   Social History Main Topics  . Smoking status: Never Smoker   . Smokeless tobacco: Never Used  . Alcohol Use: 0.6 oz/week    1 Cans of beer per week  . Drug Use: Yes    Special: Marijuana     Comment: ocassionaly   . Sexual Activity: Not Currently   Other Topics Concern  . None   Social History Narrative   [employer] man during the early 75s with deployments to the [place].  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  Divorce with 5 children, 3 boys and 2 girls.  Lives in [place].    Review of Systems: Review of Systems  Constitutional: Negative for fever and chills.  HENT: Negative for congestion.   Eyes: Negative for blurred vision and pain.  Respiratory: Positive for cough. Negative for sputum production and wheezing.   Cardiovascular: Negative for chest pain and orthopnea.  Gastrointestinal: Negative for nausea, vomiting, diarrhea and constipation.  Genitourinary: Negative for dysuria and frequency.  Musculoskeletal:       Chest wall pain  Skin: Negative for rash.  Neurological: Negative for dizziness and headaches.  Psychiatric/Behavioral: Negative for depression.    Objective:  Physical Exam: Filed Vitals:   07/20/15 1449  BP: 150/73  Pulse: 90  Temp: 98.2 F (36.8 C)    TempSrc: Oral  Weight: 310 lb (140.615 kg)  SpO2: 100%   Physical Exam  Constitutional: He is oriented to person, place, and time. He appears well-developed and well-nourished. No distress.  Pleasant, obese gentleman sitting in wheel chair  HENT:  Head: Normocephalic and atraumatic.  Poor dentition  Eyes: Conjunctivae and EOM are normal.  Neck: Normal range of motion.  Cardiovascular: Normal rate, regular rhythm and normal heart sounds.   Pulmonary/Chest: Effort normal and breath sounds normal. He exhibits tenderness. He exhibits no swelling.  Abdominal: Soft. Bowel sounds are normal.  Neurological: He is alert and oriented to person, place, and time. No cranial nerve deficit.  Skin: Skin is warm and dry.  Psychiatric: He has a normal mood and affect.  Assessment & Plan:   Please see problem list for assessment and plan.

## 2015-07-20 NOTE — Patient Instructions (Signed)
Naproxen Sodium oral tablet, extended-release What is this medicine? NAPROXEN (na PROX en) is a non-steroidal anti-inflammatory drug (NSAID). It is used to reduce swelling and to treat pain. This medicine may be used for dental pain, headache, or painful monthly periods. It is also used for painful joint and muscular problems such as arthritis, tendinitis, bursitis, and gout. This medicine may be used for other purposes; ask your health care provider or pharmacist if you have questions. What should I tell my health care provider before I take this medicine? They need to know if you have any of these conditions: -asthma -cigarette smoker -drink more than 3 alcohol containing drinks a day -heart disease or circulation problems such as heart failure or leg edema (fluid retention) -high blood pressure -kidney disease -liver disease -stomach bleeding or ulcers -an unusual or allergic reaction to naproxen, aspirin, other NSAIDs, other medicines, foods, dyes, or preservatives -pregnant or trying to get pregnant -breast-feeding How should I use this medicine? Take this medicine by mouth with a glass of water. Follow the directions on the prescription label. Take this medicine with food if it upsets your stomach. Try to not lie down for at least 10 minutes after you take it. Take your medicine at regular intervals. Do not take your medicine more often than directed. Long-term, continuous use may increase the risk of heart attack or stroke. A special MedGuide will be given to you by the pharmacist with each prescription and refill. Be sure to read this information carefully each time. Talk to your pediatrician regarding the use of this medicine in children. Special care may be needed. Overdosage: If you think you have taken too much of this medicine contact a poison control center or emergency room at once. NOTE: This medicine is only for you. Do not share this medicine with others. What if I miss a  dose? If you miss a dose, take it as soon as you can. If it is almost time for your next dose, take only that dose. Do not take double or extra doses. What may interact with this medicine? -alcohol -aspirin -cidofovir -diuretics -lithium -methotrexate -other drugs for inflammation like ketorolac or prednisone -pemetrexed -probenecid -warfarin This list may not describe all possible interactions. Give your health care provider a list of all the medicines, herbs, non-prescription drugs, or dietary supplements you use. Also tell them if you smoke, drink alcohol, or use illegal drugs. Some items may interact with your medicine. What should I watch for while using this medicine? Tell your doctor or health care professional if your pain does not get better. Talk to your doctor before taking another medicine for pain. Do not treat yourself. This medicine does not prevent heart attack or stroke. In fact, this medicine may increase the chance of a heart attack or stroke. The chance may increase with longer use of this medicine and in people who have heart disease. If you take aspirin to prevent heart attack or stroke, talk with your doctor or health care professional. Do not take other medicines that contain aspirin, ibuprofen, or naproxen with this medicine. Side effects such as stomach upset, nausea, or ulcers may be more likely to occur. Many medicines available without a prescription should not be taken with this medicine. This medicine can cause ulcers and bleeding in the stomach and intestines at any time during treatment. Do not smoke cigarettes or drink alcohol. These increase irritation to your stomach and can make it more susceptible to damage from this medicine.  Ulcers and bleeding can happen without warning symptoms and can cause death. You may get drowsy or dizzy. Do not drive, use machinery, or do anything that needs mental alertness until you know how this medicine affects you. Do not stand  or sit up quickly, especially if you are an older patient. This reduces the risk of dizzy or fainting spells. This medicine can cause you to bleed more easily. Try to avoid damage to your teeth and gums when you brush or floss your teeth. What side effects may I notice from receiving this medicine? Side effects that you should report to your doctor or health care professional as soon as possible: -black or bloody stools, blood in the urine or vomit -blurred vision -chest pain -difficulty breathing or wheezing -nausea or vomiting -severe stomach pain -skin rash, skin redness, blistering or peeling skin, hives, or itching -slurred speech or weakness on one side of the body -swelling of eyelids, throat, lips -unexplained weight gain or swelling -unusually weak or tired -yellowing of eyes or skin Side effects that usually do not require medical attention (report to your doctor or health care professional if they continue or are bothersome): -constipation -headache -heartburn This list may not describe all possible side effects. Call your doctor for medical advice about side effects. You may report side effects to FDA at 1-800-FDA-1088. Where should I keep my medicine? Keep out of the reach of children. Store at room temperature between 20 and 25 degrees C (68 and 77 degrees F). Keep container tightly closed. Throw away any unused medicine after the expiration date. NOTE: This sheet is a summary. It may not cover all possible information. If you have questions about this medicine, talk to your doctor, pharmacist, or health care provider.    2016, Elsevier/Gold Standard. (2009-09-28 [id number])

## 2015-07-21 ENCOUNTER — Telehealth: Payer: Self-pay | Admitting: *Deleted

## 2015-07-21 DIAGNOSIS — S29011A Strain of muscle and tendon of front wall of thorax, initial encounter: Secondary | ICD-10-CM | POA: Insufficient documentation

## 2015-07-21 LAB — CULTURE, BLOOD (ROUTINE X 2)
CULTURE: NO GROWTH
Culture: NO GROWTH

## 2015-07-21 NOTE — Assessment & Plan Note (Signed)
Assessment: Hgb 7.5 on admission down from baseline Hgb of 11.3. Haptoglobin low with elevated LDH, consistent with hemolysis. Patient required 5 U pRBCs before Hgb stabilizing at ~8. 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 EBV, mycoplasma, and hepatitis C antibody tests were negative. 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 However, it appears to be a transient insult resulting in low level hemolysis. Patient remained asymptomatic with regard to his anemia during hospitalization. Patient received EPO on 9/26. Will need to continue EPO shots weekly at HD if Hgb is <10.   Plan:  -repeat CBC, LDH, reticulocyte count unable to be obtained at office visit due to difficulty obtaining blood draw -order given to patient to have these labs drawn at next HD session and faxed to [place].

## 2015-07-21 NOTE — Assessment & Plan Note (Signed)
Assessment: Bacteremia was likely the cause of his leukocytosis and malaise.  Unclear what the source of bacteremia was.  TTE and TEE were both negative for vegetations.  Patient being treated with 2 weeks of cefazolin 2g every MWF at HD (9/24-10/11).  Repeat blood cultures have been NGTD.  Plan: -continue course of antibiotics: cefazolin 2g MWF w/ HD (last dose 10/11)

## 2015-07-21 NOTE — Assessment & Plan Note (Signed)
Lab Results  Component Value Date   HGBA1C 8.0* 07/03/2015   HGBA1C 7.0 10/14/2013    Assessment:  Patient with Hgb A1c of 8.0 on 07/03/2015 presents today with CBG of 155.  Patient reports good compliance with medications.  Denies any recent symptomatic hypoglycemia or hyperglycemia.  Diabetes control: not controlled    Progress toward Hgb A1c goal: deteriorated from previous A1c of 7.0 in January 2015 Home glucose monitoring: Yes.  Average CBGs are not known  Frequency: TID  Timing: before meals Comments: patient did not bring meter to determine what his glucose average is at home.  Plan:  -Medication changes: none today.  Encouraged to bring meter to follow up appt.  -Hgb A1c 8.0 on 07/03/15.  Next Hgb A1c due 10/02/15 -BP 150/73 is not at goal <140/90, continue current meds for now -Last lipid panel was 2009.  LDL was 91 then but this will need to be checked at follow up. -Last annual eye exam: done today -Last annual foot exam: 2013 - due at next visit -Continue Aspirin 81mg  daily for secondary CVD prevention -Encourage regular exercise and healthy diet -Other plans: will need to check urine microalbumin at next visit  Medications after today's visit: 1. Novolog sliding scale TID with meals

## 2015-07-21 NOTE — Assessment & Plan Note (Signed)
Assessment: Patient is complaining of chest wall pain that began during hospitalization.  States the pain is aggravated by movement (i.e. Repositioning himself in chair) and occasionally by coughing.  Pain is bilateral, reproducible with palpation.  States pain can be relieved by stretching his chest and placing hands behind his head.  He has not tried any other remedies to relieve pain.  Denies any dyspnea on exertion, no pain at rest.  No tachycardia.  Plan: -likely MSK related given worsening with movement and reproducible with palpation.   -will attempt to treat symptoms with NSAIDs -Naproxen 500mg  BID x 7 days -if Naproxen unsuccessful, can try to treat with another class of NSAID (i.e. Meloxicam or Celebrex)

## 2015-07-21 NOTE — Telephone Encounter (Signed)
Yes. Thanks 

## 2015-07-21 NOTE — Telephone Encounter (Signed)
[facility] OT calls and ask for verbal approval to see pt for 2 weeks, 2x a week, she is given a verbal ok, are you good with this?

## 2015-07-21 NOTE — Assessment & Plan Note (Signed)
BP Readings from Last 3 Encounters:  07/20/15 150/73  07/14/15 137/99  06/24/15 132/84       Component Value Date/Time   NA 130* 07/12/2015 0345   K 4.3 07/12/2015 0345   CREATININE 8.93* 07/12/2015 0345    Assessment:  Patient with HTN compliant with 1 class ([facility]B) anti-hypertensive therapy who presents today with blood pressure of 150/73.  Denies headaches, chest pain, edema, dizziness or lightheadedness  Blood pressure control: not at goal  Plan:   -Medication changes: none today.  Will continue to monitor at future f/u  -Labs: none -Other plans: none -Encourage home BP monitoring 3 times per week or at drug store occasionally  -Encourage regular exercise and healthy diet (decreased salt intake)  Medications after today's visit: 1. Amlodipine 10mg  daily

## 2015-07-22 ENCOUNTER — Telehealth: Payer: Self-pay

## 2015-07-22 NOTE — Telephone Encounter (Signed)
Liji, physical therapist from [facility] called and is requesting verbal order for physical therapy.   She would like 1 time this week, 2 times for 2 weeks, and then 1 additional time, totaling 4 weeks for work on balance, gait, and strength. Will you give verbal order for this?

## 2015-07-22 NOTE — Telephone Encounter (Signed)
Yes, that's fine 

## 2015-07-22 NOTE — Telephone Encounter (Signed)
Hi, I spoke to [name] during his appointment and we determined he does not need nebulizer.  He has a CPAP that he uses at night for sleep.  Not sure why they ordered the nebulizer at his hospital discharge.  Thanks!

## 2015-07-22 NOTE — Telephone Encounter (Signed)
PT called and informed okay to start PT

## 2015-07-23 ENCOUNTER — Inpatient Hospital Stay: Payer: Medicare Other | Admitting: Family Medicine

## 2015-07-23 ENCOUNTER — Telehealth: Payer: Self-pay | Admitting: Internal Medicine

## 2015-07-23 NOTE — Telephone Encounter (Signed)
Yes, that is fine. Thanks [employer].

## 2015-07-23 NOTE — Telephone Encounter (Signed)
Spoke with Seth [name] with [facility], nursing would like to see patient 2/week for 4 weeks. Visits to include: monitoring for improvement of bacteremia, disease process education , diabetic education, monitoring antibiotics. Verbal order given for skilled nursing, is this OK with you?

## 2015-07-23 NOTE — Telephone Encounter (Signed)
Estill Bamberg from [facility] called requesting verbal order for skilled nurse. Please call back.

## 2015-07-24 ENCOUNTER — Other Ambulatory Visit: Payer: Self-pay

## 2015-07-24 ENCOUNTER — Telehealth: Payer: Self-pay | Admitting: Internal Medicine

## 2015-07-24 DIAGNOSIS — I120 Hypertensive chronic kidney disease with stage 5 chronic kidney disease or end stage renal disease: Secondary | ICD-10-CM | POA: Diagnosis not present

## 2015-07-24 DIAGNOSIS — S29011A Strain of muscle and tendon of front wall of thorax, initial encounter: Secondary | ICD-10-CM

## 2015-07-24 DIAGNOSIS — R262 Difficulty in walking, not elsewhere classified: Secondary | ICD-10-CM | POA: Diagnosis not present

## 2015-07-24 DIAGNOSIS — N186 End stage renal disease: Secondary | ICD-10-CM | POA: Diagnosis not present

## 2015-07-24 DIAGNOSIS — M6281 Muscle weakness (generalized): Secondary | ICD-10-CM | POA: Diagnosis not present

## 2015-07-24 DIAGNOSIS — E1165 Type 2 diabetes mellitus with hyperglycemia: Secondary | ICD-10-CM | POA: Diagnosis not present

## 2015-07-24 DIAGNOSIS — D649 Anemia, unspecified: Secondary | ICD-10-CM | POA: Diagnosis not present

## 2015-07-24 MED ORDER — NAPROXEN 500 MG PO TABS: 500.0000 mg | ORAL_TABLET | Freq: Two times a day (BID) | ORAL | Status: DC

## 2015-07-24 NOTE — Telephone Encounter (Signed)
Spoke with Seth [name] via telephone.  States the Naproxen 500mg  BID is not entirely helping.  States that it knocks the edge off but MSK type chest and back pain as described in clinic returns making it difficult to complete his physical therapy.  He started Naproxen on Monday and was provided with 7 days therapy.  Plan: -will send in Rx for Naproxen 500mg  BID x 5 more days = 12 days total treatment. -recommended dividing gap between Naproxen doses with Acetaminophen 325-650mg  q6h prn -recommended alternating heat and ice as tolerated -if no relief after completion of Naproxen, will consider other NSAID (i.e. Meloxicam or Celebrex) or topical NSAID such as Voltaren gel

## 2015-07-24 NOTE — Telephone Encounter (Signed)
Talked with pt  - discomfort chest wall and back is worse. Naproxen is not helping. Request something stronger for discomfort. Talked with Dr Juleen China - no appt in clinic till 07/28/15. Dr Juleen China states he will call pt - pt aware. Hilda Blades Marvelle Caudill RN 07/24/15 3PM

## 2015-07-24 NOTE — Telephone Encounter (Signed)
Elmyra Ricks from [facility] requesting the nurse to call back.

## 2015-07-28 DIAGNOSIS — I120 Hypertensive chronic kidney disease with stage 5 chronic kidney disease or end stage renal disease: Secondary | ICD-10-CM | POA: Diagnosis not present

## 2015-07-28 DIAGNOSIS — N186 End stage renal disease: Secondary | ICD-10-CM | POA: Diagnosis not present

## 2015-07-28 DIAGNOSIS — R262 Difficulty in walking, not elsewhere classified: Secondary | ICD-10-CM | POA: Diagnosis not present

## 2015-07-28 DIAGNOSIS — M6281 Muscle weakness (generalized): Secondary | ICD-10-CM | POA: Diagnosis not present

## 2015-07-28 DIAGNOSIS — D649 Anemia, unspecified: Secondary | ICD-10-CM | POA: Diagnosis not present

## 2015-07-28 DIAGNOSIS — E1165 Type 2 diabetes mellitus with hyperglycemia: Secondary | ICD-10-CM | POA: Diagnosis not present

## 2015-07-28 NOTE — Progress Notes (Signed)
I saw and evaluated the patient.  I personally confirmed the key portions of Dr. Wallace's history and exam and reviewed pertinent patient test results.  The assessment, diagnosis, and plan were formulated together and I agree with the documentation in the resident's note. 

## 2015-07-29 ENCOUNTER — Telehealth: Payer: Self-pay | Admitting: Internal Medicine

## 2015-07-29 ENCOUNTER — Emergency Department (INDEPENDENT_AMBULATORY_CARE_PROVIDER_SITE_OTHER)
Admission: EM | Admit: 2015-07-29 | Discharge: 2015-07-29 | Disposition: A | Discharge status: Home / Self Care | Payer: Medicare Other | Source: Home / Self Care | Attending: Family Medicine | Admitting: Family Medicine

## 2015-07-29 ENCOUNTER — Encounter (HOSPITAL_COMMUNITY): Payer: Self-pay | Admitting: *Deleted

## 2015-07-29 DIAGNOSIS — G8929 Other chronic pain: Secondary | ICD-10-CM

## 2015-07-29 DIAGNOSIS — R0789 Other chest pain: Secondary | ICD-10-CM

## 2015-07-29 MED ORDER — TRAMADOL HCL 50 MG PO TABS: 50.0000 mg | ORAL_TABLET | Freq: Four times a day (QID) | ORAL | Status: DC | PRN

## 2015-07-29 MED ORDER — KETOROLAC TROMETHAMINE 30 MG/ML IJ SOLN
INTRAMUSCULAR | Status: AC
  Filled 2015-07-29: qty 1

## 2015-07-29 MED ORDER — KETOROLAC TROMETHAMINE 30 MG/ML IJ SOLN
30.0000 mg | Freq: Once | INTRAMUSCULAR | Status: AC
  Administered 2015-07-29: 30 mg via INTRAMUSCULAR

## 2015-07-29 MED ORDER — CYCLOBENZAPRINE HCL 5 MG PO TABS: 5.0000 mg | ORAL_TABLET | Freq: Three times a day (TID) | ORAL | Status: DC | PRN

## 2015-07-29 NOTE — Telephone Encounter (Signed)
Pt states muscle relaxer medication did not work for him. He continue to have pain and want to know what he should do. Please call pt back.

## 2015-07-29 NOTE — Telephone Encounter (Signed)
Spoke with patient.  He abruptly disconnected phone call before call completed.  Unable to determine exact needs.   Will wait for return call from patient.

## 2015-07-29 NOTE — Discharge Instructions (Signed)
Heat to back and medicines as needed, see your doctor if further problems.

## 2015-07-29 NOTE — ED Notes (Signed)
PT   REPORTS     LOW  BACK  PAIN     X  2-3   WEEKS     SYMPTOMS   WORSE  ON    MOVEMENT  AND  POSITION      PT  IS     SITTING  UPRIGHT  ON  THE  EXAM TABLE  SPEAKING IN  COMPLETE  SENTANCES  HE DENYS  ANY  SPECEFIC  INJURY  AT  THIS  TIME
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
## 2015-07-30 DIAGNOSIS — M6281 Muscle weakness (generalized): Secondary | ICD-10-CM | POA: Diagnosis not present

## 2015-07-30 DIAGNOSIS — I120 Hypertensive chronic kidney disease with stage 5 chronic kidney disease or end stage renal disease: Secondary | ICD-10-CM | POA: Diagnosis not present

## 2015-07-30 DIAGNOSIS — E1165 Type 2 diabetes mellitus with hyperglycemia: Secondary | ICD-10-CM | POA: Diagnosis not present

## 2015-07-30 DIAGNOSIS — D649 Anemia, unspecified: Secondary | ICD-10-CM | POA: Diagnosis not present

## 2015-07-30 DIAGNOSIS — N186 End stage renal disease: Secondary | ICD-10-CM | POA: Diagnosis not present

## 2015-07-30 DIAGNOSIS — R262 Difficulty in walking, not elsewhere classified: Secondary | ICD-10-CM | POA: Diagnosis not present

## 2015-07-31 ENCOUNTER — Other Ambulatory Visit: Payer: Self-pay

## 2015-07-31 DIAGNOSIS — D649 Anemia, unspecified: Secondary | ICD-10-CM | POA: Diagnosis not present

## 2015-07-31 DIAGNOSIS — R262 Difficulty in walking, not elsewhere classified: Secondary | ICD-10-CM | POA: Diagnosis not present

## 2015-07-31 DIAGNOSIS — E1165 Type 2 diabetes mellitus with hyperglycemia: Secondary | ICD-10-CM | POA: Diagnosis not present

## 2015-07-31 DIAGNOSIS — N186 End stage renal disease: Secondary | ICD-10-CM | POA: Diagnosis not present

## 2015-07-31 DIAGNOSIS — M6281 Muscle weakness (generalized): Secondary | ICD-10-CM | POA: Diagnosis not present

## 2015-07-31 DIAGNOSIS — I120 Hypertensive chronic kidney disease with stage 5 chronic kidney disease or end stage renal disease: Secondary | ICD-10-CM | POA: Diagnosis not present

## 2015-07-31 NOTE — Telephone Encounter (Signed)
Spoke with patient.  He reports taking all medications prescribed for his back as ordered without any change to his pain.  Reports a "burning sharp" pain in his back and chest that is "just aggravating" and keeps him up.  Pt went to urgent care and was advised he may would benefit from pain clinic which he is thinking of looking into.  He will follow up with [place] 11/16 on next appointment and call if he needs [place] before then.

## 2015-07-31 NOTE — Patient Outreach (Addendum)
This RNCM was successful in making telephone contact with patient who identified himself by providing his date of birth and address.  Patient confirmed attendants to his primary care post hospitalization. This RNCM and patient updated the [place] created to assist patient with managing his chronic illnesses with an emphasis on Diabetes.  Home visit planned for 08/06/2015 for initial home visit.

## 2015-08-04 ENCOUNTER — Encounter: Payer: Self-pay | Admitting: Dietician

## 2015-08-04 ENCOUNTER — Telehealth: Payer: Self-pay | Admitting: Internal Medicine

## 2015-08-04 DIAGNOSIS — D649 Anemia, unspecified: Secondary | ICD-10-CM | POA: Diagnosis not present

## 2015-08-04 DIAGNOSIS — E1165 Type 2 diabetes mellitus with hyperglycemia: Secondary | ICD-10-CM | POA: Diagnosis not present

## 2015-08-04 DIAGNOSIS — M6281 Muscle weakness (generalized): Secondary | ICD-10-CM | POA: Diagnosis not present

## 2015-08-04 DIAGNOSIS — N186 End stage renal disease: Secondary | ICD-10-CM | POA: Diagnosis not present

## 2015-08-04 DIAGNOSIS — R262 Difficulty in walking, not elsewhere classified: Secondary | ICD-10-CM | POA: Diagnosis not present

## 2015-08-04 DIAGNOSIS — I120 Hypertensive chronic kidney disease with stage 5 chronic kidney disease or end stage renal disease: Secondary | ICD-10-CM | POA: Diagnosis not present

## 2015-08-04 NOTE — Telephone Encounter (Signed)
LiJi from [facility] [phone]).  Patient complaining of Pain and is being redirected to the pain clinic.  Requesting Verbal to go back and 2 wks and to put patient on hold.

## 2015-08-04 NOTE — Telephone Encounter (Signed)
Called LiJi from [facility] - states pt is not doing any activity due to pain. Told LiJi that I would call pt and probably make an appt if he is having pain.  Called pt and states muscle relaxer and pain med is not helping upper back pain. Has dialysis M-W-F. Bertram Denver states he will call pt regarding an appt this week. Pt states someone suggested pt to go to pain clinic - pt aware needs an appt for referral. Hilda Blades Margi Edmundson RN 08/04/15 2:20PM

## 2015-08-05 ENCOUNTER — Encounter: Payer: Self-pay | Admitting: Internal Medicine

## 2015-08-05 ENCOUNTER — Ambulatory Visit (INDEPENDENT_AMBULATORY_CARE_PROVIDER_SITE_OTHER): Payer: Medicare Other | Admitting: Internal Medicine

## 2015-08-05 VITALS — BP 154/80 | HR 88 | Temp 98.0°F | Ht 68.0 in | Wt 306.5 lb

## 2015-08-05 DIAGNOSIS — D591 Other autoimmune hemolytic anemias: Secondary | ICD-10-CM | POA: Diagnosis not present

## 2015-08-05 DIAGNOSIS — Z6841 Body Mass Index (BMI) 40.0 and over, adult: Secondary | ICD-10-CM | POA: Diagnosis not present

## 2015-08-05 DIAGNOSIS — E1122 Type 2 diabetes mellitus with diabetic chronic kidney disease: Secondary | ICD-10-CM

## 2015-08-05 DIAGNOSIS — S29011A Strain of muscle and tendon of front wall of thorax, initial encounter: Secondary | ICD-10-CM

## 2015-08-05 DIAGNOSIS — N186 End stage renal disease: Secondary | ICD-10-CM

## 2015-08-05 DIAGNOSIS — D5912 Cold autoimmune hemolytic anemia: Secondary | ICD-10-CM

## 2015-08-05 DIAGNOSIS — Z794 Long term (current) use of insulin: Secondary | ICD-10-CM | POA: Diagnosis not present

## 2015-08-05 DIAGNOSIS — Z992 Dependence on renal dialysis: Secondary | ICD-10-CM

## 2015-08-05 DIAGNOSIS — M791 Myalgia: Secondary | ICD-10-CM | POA: Diagnosis not present

## 2015-08-05 DIAGNOSIS — Z8619 Personal history of other infectious and parasitic diseases: Secondary | ICD-10-CM

## 2015-08-05 MED ORDER — CYCLOBENZAPRINE HCL 10 MG PO TABS: 10.0000 mg | ORAL_TABLET | Freq: Three times a day (TID) | ORAL | Status: DC | PRN

## 2015-08-05 NOTE — Progress Notes (Signed)
Medicine attending: I personally interviewed and briefly examined this patient, and reviewed pertinent clinical laboratory and radiographic data  with resident physician Dr.Vishal Posey Pronto  and we discussed a   management plan. Rather complicated morbidly obese man I know from recent hospitalization. He has ESRD on dialysis. He presented with flu like sxs following a flu vaccine. No fever. Significant anemia - hemoglobin as low as 5.4 grams. Positive cold agglutinin with transient hemolysis. Negative serology for EBV & Mycoplasma but blood cultures unexpectedly grew MSSA & he received a full 4 wk course of antibiotics. He has had persistent, muscle tenderness in the shoulder, trapezius, & pectoral muscles not responsive to NSAIDS or low dose flexeril. We will try a dose of flexeril more appropriate for his weight. Check CK & aldolase. Follow up CBC, LDH, Haptoglobin.
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
## 2015-08-05 NOTE — Assessment & Plan Note (Signed)
Hgb A1c on 07/03/15 was 8.0. Patient currently on Novolog sliding scale TID with meals. He reports compliance with his insulin and does not take without a meal and checks his blood sugar at home before meals. He denies any recent hyperglycemic or hypoglycemic readings or symptoms. -Continue Novolog sliding scale TID with meals -Repeat A1C in December

## 2015-08-05 NOTE — Assessment & Plan Note (Signed)
Patient reports 3-4 weeks of constant pain described as burning/aching sensation on his back below his shoulder blades to his anterior upper chest wall bilaterally. He says the pain is bothersome enough where he is not sleeping well. He feels the pain more with movement, touch, and deep breaths. He denies any retrosternal pressure-like pain or radiation to his arms, neck, or jaw. He was seen on 10/10 for similar complaints and started on Naproxen 500 mg BID which provided minimal relief for about 1 hour. He has also tried Acetaminophen which has not helped. On 10/19, he went to urgent care for this pain and was given a Toradol shot 30 mg IM and 4 day prescription of Tramadol 50 mg to take every 6 hours prn for pain, both of which provided no relief. He was also given a prescription of Flexeril 5 mg TID which has not helped much.  I do not think this is a cardiac related ischemic issue as his pain is easily reproducible with palpation, he is in no distress, and without typical chest pain symptoms. His pain is more likely musckuloskeletal in origin. Possibly some neuropathic element as he describes some numbness/tingling and burning sensation. I doubt his pain is due to shingles as his symptoms are bilateral and without rash.  -Will increase his Flexeril to 10 mg TID prn for pain -Check CK, Aldolase

## 2015-08-05 NOTE — Patient Instructions (Signed)
I have sent a prescription for the muscle relaxer Flexeril to your pharmacy. It is a 10 mg pill you can take up to 3 times a day as needed for pain. We will also check some blood tests today.  Continue to try heat packs for your chest and back pain.

## 2015-08-05 NOTE — Assessment & Plan Note (Signed)
Patient was recently admitted to the hospital from 9/23-10/4 and found to have a cold-agglutinin hemolytic anemia of unknown cause as well as Staph lugdenesis bacteremia which was treated with 2 weeks course of Cefazolin (also unknown cause with negative TTE and TEE). He states he is not having any of the symptoms that sent him to the hospital, which he attributed to the flu shot he received around that time.   CBC, LDH, and Reticulocyte count were collected at HD session after last office visit with instructions to be faxed to [place]. We check if we have received those results and will also repeat labs again at his next HD session. -Check CBC, LDH, Reticulocyte count, Haptoglobin, Bilirubin, CK, Aldolase (labs written on prescription note with instructions to fax results to [place] and given to patient)

## 2015-08-06 ENCOUNTER — Other Ambulatory Visit: Payer: Self-pay

## 2015-08-06 DIAGNOSIS — N186 End stage renal disease: Secondary | ICD-10-CM | POA: Diagnosis not present

## 2015-08-06 DIAGNOSIS — M6281 Muscle weakness (generalized): Secondary | ICD-10-CM | POA: Diagnosis not present

## 2015-08-06 DIAGNOSIS — D649 Anemia, unspecified: Secondary | ICD-10-CM | POA: Diagnosis not present

## 2015-08-06 DIAGNOSIS — I120 Hypertensive chronic kidney disease with stage 5 chronic kidney disease or end stage renal disease: Secondary | ICD-10-CM | POA: Diagnosis not present

## 2015-08-06 DIAGNOSIS — R262 Difficulty in walking, not elsewhere classified: Secondary | ICD-10-CM | POA: Diagnosis not present

## 2015-08-06 DIAGNOSIS — E1165 Type 2 diabetes mellitus with hyperglycemia: Secondary | ICD-10-CM | POA: Diagnosis not present
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
## 2015-08-07 ENCOUNTER — Telehealth: Payer: Self-pay

## 2015-08-07 NOTE — Telephone Encounter (Signed)
Received call from [place] physical therapist with [facility].  Seth [name] is wanting a verbal order to temporarily hold on physical therapy services because pt has been refusing visits due to being in pain.   Is it OK to give her this VO?

## 2015-08-10 ENCOUNTER — Encounter: Payer: Self-pay | Admitting: Internal Medicine

## 2015-08-10 ENCOUNTER — Ambulatory Visit (INDEPENDENT_AMBULATORY_CARE_PROVIDER_SITE_OTHER): Payer: Medicare Other | Admitting: Internal Medicine

## 2015-08-10 ENCOUNTER — Ambulatory Visit (HOSPITAL_COMMUNITY)
Admission: RE | Admit: 2015-08-10 | Discharge: 2015-08-10 | Disposition: A | Discharge status: Home / Self Care | Payer: Medicare Other | Source: Ambulatory Visit | Attending: Internal Medicine | Admitting: Internal Medicine

## 2015-08-10 ENCOUNTER — Inpatient Hospital Stay (HOSPITAL_COMMUNITY)
Admission: EM | Admit: 2015-08-10 | Discharge: 2015-08-17 | DRG: 456 | Disposition: A | Discharge status: Rehabilitation Facility | Payer: Medicare Other | Attending: Internal Medicine | Admitting: Internal Medicine

## 2015-08-10 ENCOUNTER — Encounter (HOSPITAL_COMMUNITY): Payer: Self-pay | Admitting: Emergency Medicine

## 2015-08-10 ENCOUNTER — Emergency Department (HOSPITAL_COMMUNITY): Payer: Medicare Other

## 2015-08-10 ENCOUNTER — Other Ambulatory Visit: Payer: Self-pay | Admitting: Internal Medicine

## 2015-08-10 ENCOUNTER — Other Ambulatory Visit: Payer: Self-pay

## 2015-08-10 VITALS — BP 150/75 | HR 95 | Temp 98.4°F | Ht 68.0 in | Wt 307.0 lb

## 2015-08-10 DIAGNOSIS — M4624 Osteomyelitis of vertebra, thoracic region: Secondary | ICD-10-CM | POA: Diagnosis not present

## 2015-08-10 DIAGNOSIS — M792 Neuralgia and neuritis, unspecified: Secondary | ICD-10-CM

## 2015-08-10 DIAGNOSIS — G9340 Encephalopathy, unspecified: Secondary | ICD-10-CM | POA: Diagnosis present

## 2015-08-10 DIAGNOSIS — E1122 Type 2 diabetes mellitus with diabetic chronic kidney disease: Secondary | ICD-10-CM | POA: Diagnosis not present

## 2015-08-10 DIAGNOSIS — G9520 Unspecified cord compression: Secondary | ICD-10-CM

## 2015-08-10 DIAGNOSIS — Z419 Encounter for procedure for purposes other than remedying health state, unspecified: Secondary | ICD-10-CM

## 2015-08-10 DIAGNOSIS — N250 Renal osteodystrophy: Secondary | ICD-10-CM | POA: Diagnosis present

## 2015-08-10 DIAGNOSIS — J9601 Acute respiratory failure with hypoxia: Secondary | ICD-10-CM | POA: Diagnosis not present

## 2015-08-10 DIAGNOSIS — D638 Anemia in other chronic diseases classified elsewhere: Secondary | ICD-10-CM | POA: Diagnosis present

## 2015-08-10 DIAGNOSIS — N186 End stage renal disease: Secondary | ICD-10-CM | POA: Diagnosis not present

## 2015-08-10 DIAGNOSIS — G061 Intraspinal abscess and granuloma: Secondary | ICD-10-CM | POA: Diagnosis not present

## 2015-08-10 DIAGNOSIS — J9690 Respiratory failure, unspecified, unspecified whether with hypoxia or hypercapnia: Secondary | ICD-10-CM

## 2015-08-10 DIAGNOSIS — E662 Morbid (severe) obesity with alveolar hypoventilation: Secondary | ICD-10-CM | POA: Diagnosis present

## 2015-08-10 DIAGNOSIS — E1110 Type 2 diabetes mellitus with ketoacidosis without coma: Secondary | ICD-10-CM

## 2015-08-10 DIAGNOSIS — A419 Sepsis, unspecified organism: Secondary | ICD-10-CM | POA: Diagnosis not present

## 2015-08-10 DIAGNOSIS — M4804 Spinal stenosis, thoracic region: Secondary | ICD-10-CM | POA: Diagnosis present

## 2015-08-10 DIAGNOSIS — M549 Dorsalgia, unspecified: Secondary | ICD-10-CM | POA: Diagnosis not present

## 2015-08-10 DIAGNOSIS — M40209 Unspecified kyphosis, site unspecified: Secondary | ICD-10-CM | POA: Diagnosis present

## 2015-08-10 DIAGNOSIS — Z794 Long term (current) use of insulin: Secondary | ICD-10-CM

## 2015-08-10 DIAGNOSIS — M546 Pain in thoracic spine: Secondary | ICD-10-CM

## 2015-08-10 DIAGNOSIS — M8448XA Pathological fracture, other site, initial encounter for fracture: Secondary | ICD-10-CM | POA: Diagnosis present

## 2015-08-10 DIAGNOSIS — R6521 Severe sepsis with septic shock: Secondary | ICD-10-CM

## 2015-08-10 DIAGNOSIS — Z992 Dependence on renal dialysis: Secondary | ICD-10-CM | POA: Diagnosis not present

## 2015-08-10 DIAGNOSIS — R7881 Bacteremia: Secondary | ICD-10-CM | POA: Diagnosis present

## 2015-08-10 DIAGNOSIS — M7918 Myalgia, other site: Secondary | ICD-10-CM | POA: Diagnosis present

## 2015-08-10 DIAGNOSIS — M4644 Discitis, unspecified, thoracic region: Secondary | ICD-10-CM | POA: Diagnosis not present

## 2015-08-10 DIAGNOSIS — M4640 Discitis, unspecified, site unspecified: Secondary | ICD-10-CM

## 2015-08-10 DIAGNOSIS — I959 Hypotension, unspecified: Secondary | ICD-10-CM | POA: Diagnosis present

## 2015-08-10 DIAGNOSIS — Z6841 Body Mass Index (BMI) 40.0 and over, adult: Secondary | ICD-10-CM

## 2015-08-10 DIAGNOSIS — Z7982 Long term (current) use of aspirin: Secondary | ICD-10-CM

## 2015-08-10 HISTORY — DX: End stage renal disease: N18.6

## 2015-08-10 HISTORY — DX: Discitis, unspecified, site unspecified: M46.40

## 2015-08-10 HISTORY — DX: Dependence on renal dialysis: Z99.2

## 2015-08-10 LAB — COMPREHENSIVE METABOLIC PANEL
ALK PHOS: 99 U/L (ref 38–126)
ALT: 10 U/L — AB (ref 17–63)
AST: 14 U/L — AB (ref 15–41)
Albumin: 3.8 g/dL (ref 3.5–5.0)
Anion gap: 13 (ref 5–15)
BUN: 19 mg/dL (ref 6–20)
CHLORIDE: 88 mmol/L — AB (ref 101–111)
CO2: 31 mmol/L (ref 22–32)
CREATININE: 6.73 mg/dL — AB (ref 0.61–1.24)
Calcium: 8.9 mg/dL (ref 8.9–10.3)
GFR calc Af Amer: 10 mL/min — ABNORMAL LOW (ref >60)
GFR, EST NON AFRICAN AMERICAN: 9 mL/min — AB (ref >60)
Glucose, Bld: 165 mg/dL — ABNORMAL HIGH (ref 65–99)
Potassium: 3.4 mmol/L — ABNORMAL LOW (ref 3.5–5.1)
Sodium: 132 mmol/L — ABNORMAL LOW (ref 135–145)
Total Bilirubin: 0.5 mg/dL (ref 0.3–1.2)
Total Protein: 8.5 g/dL — ABNORMAL HIGH (ref 6.5–8.1)

## 2015-08-10 LAB — CBC WITH DIFFERENTIAL/PLATELET
BASOS ABS: 0 10*3/uL (ref 0.0–0.1)
Basophils Relative: 0 %
EOS PCT: 1 %
Eosinophils Absolute: 0.2 10*3/uL (ref 0.0–0.7)
HEMATOCRIT: 31.3 % — AB (ref 39.0–52.0)
HEMOGLOBIN: 10.1 g/dL — AB (ref 13.0–17.0)
LYMPHS PCT: 14 %
Lymphs Abs: 1.9 10*3/uL (ref 0.7–4.0)
MCH: 31.5 pg (ref 26.0–34.0)
MCHC: 32.3 g/dL (ref 30.0–36.0)
MCV: 97.5 fL (ref 78.0–100.0)
Monocytes Absolute: 0.7 10*3/uL (ref 0.1–1.0)
Monocytes Relative: 5 %
NEUTROS ABS: 10.7 10*3/uL — AB (ref 1.7–7.7)
NEUTROS PCT: 80 %
PLATELETS: 333 10*3/uL (ref 150–400)
RBC: 3.21 MIL/uL — AB (ref 4.22–5.81)
RDW: 15.8 % — ABNORMAL HIGH (ref 11.5–15.5)
WBC: 13.5 10*3/uL — AB (ref 4.0–10.5)

## 2015-08-10 LAB — GLUCOSE, CAPILLARY: GLUCOSE-CAPILLARY: 184 mg/dL — AB (ref 65–99)

## 2015-08-10 LAB — I-STAT CG4 LACTIC ACID, ED: LACTIC ACID, VENOUS: 1.19 mmol/L (ref 0.5–2.0)

## 2015-08-10 MED ORDER — GABAPENTIN 300 MG PO CAPS: 300.0000 mg | ORAL_CAPSULE | Freq: Three times a day (TID) | ORAL | Status: DC

## 2015-08-10 MED ORDER — IBUPROFEN 800 MG PO TABS: 800.0000 mg | ORAL_TABLET | Freq: Once | ORAL | Status: DC

## 2015-08-10 MED ORDER — MORPHINE SULFATE (PF) 4 MG/ML IV SOLN
4.0000 mg | Freq: Once | INTRAVENOUS | Status: AC
  Administered 2015-08-10: 4 mg via INTRAVENOUS
  Filled 2015-08-10: qty 1

## 2015-08-10 NOTE — Telephone Encounter (Signed)
Yes, that is OK. Thanks.

## 2015-08-10 NOTE — Patient Instructions (Signed)
Seth [name] it was nice meeting you today.  -Take Gabapentin 300 mg: 1 capsule by mouth three times daily for nerve pain.   -I have ordered an MRI of your back. Our office will schedule an appointment date for you.   -Return for a follow-up visit in 2-3 weeks after your MRI.  -If your pain or other symptoms worsen, please come in sooner!

## 2015-08-10 NOTE — Patient Outreach (Signed)
Unsuccessful attempt to contact patient for community care coordination on telephone number [phone]. HIPPA compliant message left with this RNCM's contact information.   Will make another attempt to contact patient on Thursday, November 3 if patient does not return call by that date.

## 2015-08-10 NOTE — ED Notes (Signed)
Pt reports sharp pain in  Mid back ongoing x 1 month that's getting worse. Pt sts he fell twice d.t pain. sts pain radiates down both legs. Denies urinary or bowel incontinence. Denies injury.

## 2015-08-10 NOTE — ED Provider Notes (Signed)
CSN: [id number]     Arrival date & time 08/10/15  1935 History   First MD Initiated Contact with Patient 08/10/15 2135     Chief Complaint  Patient presents with  . Back Pain     (Consider location/radiation/quality/duration/timing/severity/associated sxs/prior Treatment) HPI Comments: Patient is a 49 year old male with past medical history of diabetes, hypertension, CKD presents the ED with complaint of mid back pain, onset 3 weeks. Patient reports worsening mid back pain that he describes as a constant sharp pain that radiates laterally across his anterior chest wall. He notes the pain is worse with movement. Denies any trauma, injury. He notes his legs have given out on him due to back pain causing him to fall. Denies head injury or LOC. Pt denies fever, numbness, tingling, loss of bowel or bladder, IVDU, cancer or recent spinal manipulation. Patient notes he has been seen twice in the past few weeks for his back pain and has been given Flexeril, reports no relief. He notes he was scheduled to have an MRI done last week but notes he was unable to complete the MRI due to feeling claustrophobic. Patient was admitted over month ago for bacteremia. He notes he has since completed his course of antibiotics after discharge.   Past Medical History  Diagnosis Date  . Hypertension   . ESRD (end stage renal disease) on dialysis ([place])   . 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 Sleep apnea     USES CPAP  . Diabetes mellitus     INSULIN DEPENDENT DIABETES  . Discitis 07/2015   Past Surgical History  Procedure Laterality Date  . Av fistula placement  05/03/2012    Procedure: ARTERIOVENOUS (AV) FISTULA CREATION;  Surgeon: Mal [name], MD;  Location: [facility];  Service: Vascular;  Laterality: Right;  . Tracheostomy tube placement N/A 08/20/2013    Procedure: TRACHEOSTOMY Revision;  Surgeon: Melida Quitter, MD;  Location: [facility] [place];  Service: ENT;   Laterality: N/A;  . Tee without cardioversion N/A 07/10/2015    Procedure: TRANSESOPHAGEAL ECHOCARDIOGRAM (TEE);  Surgeon: Josue Hector, MD;  Location[facility];  Service: Cardiovascular;  Laterality: N/A;   Family History  Problem Relation Age of Onset  . Diabetes Mother   . Diabetes Sister   . Diabetes Brother    Social History  Substance Use Topics  . Smoking status: Never Smoker   . Smokeless tobacco: Never Used  . Alcohol Use: 0.6 oz/week    1 Cans of beer per week    Review of Systems  Musculoskeletal: Positive for back pain.  Neurological: Positive for weakness and numbness.  All other systems reviewed and are negative.     Allergies  Review of patient's allergies indicates no known allergies.  Home Medications   Prior to Admission medications   Medication Sig Start Date End Date Taking? Authorizing Provider  amLODipine (NORVASC) 10 MG tablet Take 1 tablet (10 mg total) by mouth daily. 09/19/13  Yes Donita Brooks, NP  aspirin EC 81 MG tablet Take 81 mg by mouth daily.   Yes Historical Provider, MD  calcium acetate (PHOSLO) 667 MG capsule Take 2 capsules (1,334 mg total) by mouth 3 (three) times daily with meals. 09/19/13  Yes Donita Brooks, NP  Cholecalciferol (VITAMIN D PO) Take 1 tablet by mouth daily.   Yes Historical Provider, MD  cyclobenzaprine (FLEXERIL) 10 MG tablet Take 1 tablet (10 mg total)  by mouth 3 (three) times daily as needed for muscle spasms. 08/05/15  Yes Zada Finders, MD  insulin aspart (NOVOLOG) 100 UNIT/ML injection Inject 0-15 Units into the skin 3 (three) times daily with meals. CBG < 70: drink orange juice and recheck glucose or use glucose tabs CBG > 400: call MD   CBG 70 - 120: 0 units CBG 121 - 150: 3 units CBG 151 - 200: 4 units CBG 201 - 250: 7 units CBG 251 - 300: 11 units CBG 301 - 350: 15 units CBG 351 - 400: 20 units 09/19/13  Yes Donita Brooks, NP  albuterol (PROVENTIL) (2.5 MG/3ML) 0.083% nebulizer solution Take 3 mLs  (2.5 mg total) by nebulization every 6 (six) hours as needed for wheezing or shortness of breath. Patient not taking: Reported on 07/17/2015 07/14/15   Jones Bales, MD  ceFAZolin (ANCEF) 2-3 GM-% SOLR Inject 50 mLs (2 g total) into the vein every Monday, Wednesday, and Friday with hemodialysis. Patient not taking: Reported on 08/05/2015 07/14/15   Iline Oven, MD  Darbepoetin Alfa (ARANESP) 150 MCG/0.3ML SOSY injection Inject 0.3 mLs (150 mcg total) into the vein every Monday with hemodialysis. 07/14/15   Iline Oven, MD  diazepam (VALIUM) 2 MG tablet Take 1 tablet (2 mg total) by mouth every 8 (eight) hours as needed for muscle spasms. Patient not taking: Reported on 07/17/2015 06/17/15   Loleta Chance, MD  gabapentin (NEURONTIN) 300 MG capsule Take 1 capsule (300 mg total) by mouth 3 (three) times daily. 08/10/15 08/09/16  Shela Leff, MD  naproxen (NAPROSYN) 500 MG tablet Take 1 tablet (500 mg total) by mouth 2 (two) times daily with a meal. Patient not taking: Reported on 08/05/2015 07/24/15 07/23/16  Jule Ser, DO  predniSONE (DELTASONE) 20 MG tablet Take 2 tablets (40 mg total) by mouth daily. Patient not taking: Reported on 07/17/2015 07/14/15   Jones Bales, MD  traMADol (ULTRAM) 50 MG tablet Take 1 tablet (50 mg total) by mouth every 6 (six) hours as needed. For back pain Patient not taking: Reported on 08/05/2015 07/29/15   Billy Fischer, MD  Vitamin D, Ergocalciferol, (DRISDOL) 50000 UNITS CAPS capsule Take 1 capsule (50,000 Units total) by mouth every 7 (seven) days. *takes on Friday* 09/19/13   Donita Brooks, NP   BP 127/57 mmHg  Pulse 77  Temp(Src) 99.2 F (37.3 C) (Axillary)  Resp 18  Ht 5\' 8"  (1.727 m)  Wt 306 lb 3.5 oz (138.9 kg)  BMI 46.57 kg/m2  SpO2 100% Physical Exam  Constitutional: He is oriented to person, place, and time. He appears well-developed and well-nourished. No distress.  Morbidly obese  HENT:  Head: Normocephalic and atraumatic.   Mouth/Throat: Oropharynx is clear and moist. No oropharyngeal exudate.  Eyes: Conjunctivae and EOM are normal. Pupils are equal, round, and reactive to light. Right eye exhibits no discharge. Left eye exhibits no discharge. No scleral icterus.  Neck: Normal range of motion. Neck supple.  Cardiovascular: Normal rate, regular rhythm, normal heart sounds and intact distal pulses.   Pulmonary/Chest: Effort normal and breath sounds normal. No respiratory distress. He has no wheezes. He has no rales. He exhibits tenderness (diffuse TTP).  Abdominal: Soft. Bowel sounds are normal. He exhibits no distension and no mass. There is no tenderness. There is no rebound and no guarding.  Protuberant abdomen  Musculoskeletal: Normal range of motion. He exhibits tenderness. He exhibits no edema.       Cervical back: Normal.  He exhibits normal range of motion, no tenderness, no bony tenderness, no swelling, no edema, no deformity, no laceration and no spasm.       Thoracic back: He exhibits tenderness and bony tenderness. He exhibits normal range of motion, no swelling, no edema, no deformity, no laceration and no spasm.       Lumbar back: Normal. He exhibits normal range of motion, no tenderness, no bony tenderness, no swelling, no edema, no deformity, no laceration and no spasm.  No C, L midline TTP. Thoracic midline and paraspinal muscles TTP. Pt able to stand without assistance but has difficulty ambulating due to pain. Sensation intact. 2+ distal pulses. 5/5 strength in BUE and left leg, 4/5 strength noted to right leg.   Lymphadenopathy:    He has no cervical adenopathy.  Neurological: He is alert and oriented to person, place, and time. He has normal reflexes. No sensory deficit. Coordination and gait normal.  Skin: Skin is warm and dry. No rash noted. He is not diaphoretic.  No track marks.   Nursing note and vitals reviewed.   ED Course  Procedures (including critical care time) Labs Review Labs  Reviewed  CBC WITH DIFFERENTIAL/PLATELET - Abnormal; Notable for the following:    WBC 13.5 (*)    RBC 3.21 (*)    Hemoglobin 10.1 (*)    HCT 31.3 (*)    RDW 15.8 (*)    Neutro Abs 10.7 (*)    All other components within normal limits  COMPREHENSIVE METABOLIC PANEL - Abnormal; Notable for the following:    Sodium 132 (*)    Potassium 3.4 (*)    Chloride 88 (*)    Glucose, Bld 165 (*)    Creatinine, Ser 6.73 (*)    Total Protein 8.5 (*)    AST 14 (*)    ALT 10 (*)    GFR calc non Af Amer 9 (*)    GFR calc Af Amer 10 (*)    All other components within normal limits  C-REACTIVE PROTEIN - Abnormal; Notable for the following:    CRP 13.0 (*)    All other components within normal limits  GLUCOSE, CAPILLARY - Abnormal; Notable for the following:    Glucose-Capillary 147 (*)    All other components within normal limits  GLUCOSE, CAPILLARY - Abnormal; Notable for the following:    Glucose-Capillary 175 (*)    All other components within normal limits  SEDIMENTATION RATE - Abnormal; Notable for the following:    Sed Rate 132 (*)    All other components within normal limits  GLUCOSE, CAPILLARY - Abnormal; Notable for the following:    Glucose-Capillary 225 (*)    All other components within normal limits  GLUCOSE, CAPILLARY - Abnormal; Notable for the following:    Glucose-Capillary 153 (*)    All other components within normal limits  CBC WITH DIFFERENTIAL/PLATELET - Abnormal; Notable for the following:    RBC 3.02 (*)    Hemoglobin 9.6 (*)    HCT 30.0 (*)    RDW 15.6 (*)    All other components within normal limits  RENAL FUNCTION PANEL - Abnormal; Notable for the following:    Sodium 131 (*)    Chloride 86 (*)    Glucose, Bld 219 (*)    BUN 35 (*)    Creatinine, Ser 9.72 (*)    Calcium 8.5 (*)    Phosphorus 7.0 (*)    Albumin 3.1 (*)    GFR calc non  Af Amer 5 (*)    GFR calc Af Amer 6 (*)    All other components within normal limits  GLUCOSE, CAPILLARY - Abnormal;  Notable for the following:    Glucose-Capillary 502 (*)    All other components within normal limits  GLUCOSE, CAPILLARY - Abnormal; Notable for the following:    Glucose-Capillary 221 (*)    All other components within normal limits  SEDIMENTATION RATE - Abnormal; Notable for the following:    Sed Rate 128 (*)    All other components within normal limits  GLUCOSE, CAPILLARY - Abnormal; Notable for the following:    Glucose-Capillary 153 (*)    All other components within normal limits  GLUCOSE, CAPILLARY - Abnormal; Notable for the following:    Glucose-Capillary 140 (*)    All other components within normal limits  GLUCOSE, CAPILLARY - Abnormal; Notable for the following:    Glucose-Capillary 199 (*)    All other components within normal limits  BASIC METABOLIC PANEL - Abnormal; Notable for the following:    Chloride 97 (*)    Glucose, Bld 217 (*)    BUN 22 (*)    Creatinine, Ser 6.79 (*)    Calcium 7.9 (*)    GFR calc non Af Amer 8 (*)    GFR calc Af Amer 10 (*)    All other components within normal limits  BLOOD GAS, ARTERIAL - Abnormal; Notable for the following:    pH, Arterial 7.345 (*)    pCO2 arterial 47.0 (*)    pO2, Arterial 106 (*)    Bicarbonate 24.9 (*)    All other components within normal limits  TRIGLYCERIDES - Abnormal; Notable for the following:    Triglycerides 217 (*)    All other components within normal limits  GLUCOSE, CAPILLARY - Abnormal; Notable for the following:    Glucose-Capillary 159 (*)    All other components within normal limits  CULTURE, BLOOD (ROUTINE X 2)  CULTURE, BLOOD (ROUTINE X 2)  MRSA PCR SCREENING  CULTURE, BLOOD (SINGLE)  WOUND CULTURE  CULTURE, ROUTINE-ABSCESS  HEPATITIS B SURFACE ANTIGEN  PTH, INTACT AND CALCIUM  CALCIUM, IONIZED  I-STAT CG4 LACTIC ACID, ED  TYPE AND SCREEN  PREPARE RBC (CROSSMATCH)  SURGICAL PATHOLOGY    Imaging Review Mr Thoracic Spine Wo Contrast  08/11/2015  CLINICAL DATA:  Increasing back  pain in the upper back. EXAM: MRI THORACIC SPINE WITHOUT CONTRAST TECHNIQUE: Multiplanar, multisequence MR imaging of the thoracic spine was performed. No intravenous contrast was administered. COMPARISON:  None. FINDINGS: There is fluid signal in the T4-5 disc space with endplate destructive changes and vertebral body height loss at at T4 and T5 with associated marrow edema. There is mild paravertebral soft tissue phlegmon. There is no drainable paravertebral fluid collection. There is no definite epidural fluid collection. There is a broad-based disc bulge at T4-5 with retropulsion of the posterior inferior margin of T4 and superior posterior margin of T5 resulting in moderate spinal stenosis and mildly deforming the thoracic spinal cord. There is edema in the posterior paravertebral soft tissues. There is thoracic spine kyphosis centered at T4-5. The alignment is anatomic. There is no acute fracture or static listhesis. The marrow signal is otherwise normal. The remainder of the thoracic spinal cord is normal in size and signal. The disc spaces are maintained. T1-T2: No significant disc protrusion, foraminal stenosis or central canal stenosis. T2-T3: No significant disc protrusion, foraminal stenosis or central canal stenosis. T3-T4: No significant disc protrusion,  foraminal stenosis or central canal stenosis. T5-T6: No significant disc protrusion, foraminal stenosis or central canal stenosis. T6-T7: No significant disc protrusion, foraminal stenosis or central canal stenosis. T7-T8: No significant disc protrusion, foraminal stenosis or central canal stenosis. T8-T9: No significant disc protrusion, foraminal stenosis or central canal stenosis. T9-T10: No significant disc protrusion, foraminal stenosis or central canal stenosis. T10-T11: No significant disc protrusion, foraminal stenosis or central canal stenosis. T11-T12: No significant disc protrusion, foraminal stenosis or central canal stenosis. IMPRESSION: 1.  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 Retropulsion of the inferior posterior margin of T4 and superior posterior margin of T5 impresses upon the spinal canal moderately narrowing the spinal canal and mildly deforming the thoracic spinal cord. There is no evidence of an epidural abscess. There is paravertebral mild phlegmonous changes and posterior paraspinal inflammatory changes. Electronically Signed   By: Kathreen Devoid   On: 08/11/2015 16:27   Dg Thoracic Spine 1 View  08/12/2015  CLINICAL DATA:  49 year old with discitis/osteomyelitis at T4-5. Debridement and fusion. EXAM: OPERATIVE THORACIC SPINE 1 VIEW(S) COMPARISON:  MRI thoracic spine 08/11/2015 and CT thoracic spine 08/10/2015. FINDINGS: Two AP spot images of the thoracic spine were obtained with the C-arm fluoroscopic device and submitted for interpretation postoperatively. These demonstrate bilateral pedicle screws at T1, T2, T3, T6 and T7. Osteolysis of T4 and T5 again noted. The radiologic technologist documented 39 sec of fluoroscopy time. IMPRESSION: Images obtained during debridement of discitis/osteomyelitis at T4-5 and thoracic spine fusion. Electronically Signed   By: Evangeline Dakin M.D.   On: 08/12/2015 22:51   Dg C-arm 61-120 Min  08/12/2015  CLINICAL DATA:  49 year old with discitis/osteomyelitis at T4-5. Debridement and fusion. EXAM: OPERATIVE THORACIC SPINE 1 VIEW(S) COMPARISON:  MRI thoracic spine 08/11/2015 and CT thoracic spine 08/10/2015. FINDINGS: Two AP spot images of the thoracic spine were obtained with the C-arm fluoroscopic device and submitted for interpretation postoperatively. These demonstrate bilateral pedicle screws at T1, T2, T3, T6 and T7. Osteolysis of T4 and T5 again noted. The radiologic technologist documented 39 sec of fluoroscopy time. IMPRESSION: Images obtained during debridement of discitis/osteomyelitis at T4-5 and  thoracic spine fusion. Electronically Signed   By: Evangeline Dakin M.D.   On: 08/12/2015 22:51   I have personally reviewed and evaluated these images and lab results as part of my medical decision-making.  Filed Vitals:   08/13/15 0615  BP: 127/57  Pulse: 77  Temp:   Resp: 18     MDM   Final diagnoses:  Discitis of thoracic region  Spinal cord compression [facility])    Patient presents with constant mid back pain for the past 3 weeks, no relief with with Flexeril. Denies any recent fall, trauma, injury. He has been seen by his PCP twice for this complaint. Unable to complete MRI that was ordered by his PCP due to patient feeling claustrophobic. VSS. Exam revealed tenderness at thoracic midline and paraspinal muscles, weakness noted to right leg. Patient able to stand but unable to ambulate due to pain. Patient given pain meds. WBC 13.5. Lactic acid 1.19.   CT thoracic and lumbar spine pending.   Hand-off to Shari-Upstill, PA-C, CT pending however plan for admission due to right leg weakness.   Chesley Noon [place], [place] 08/13/15 [id number]  Wandra Arthurs, MD 08/13/15 [phone]

## 2015-08-10 NOTE — Addendum Note (Signed)
Addended by: Zada Finders R on: 08/10/2015 01:18 PM   Modules accepted: Orders

## 2015-08-11 ENCOUNTER — Encounter (HOSPITAL_COMMUNITY): Payer: Self-pay | Admitting: Internal Medicine

## 2015-08-11 ENCOUNTER — Observation Stay (HOSPITAL_COMMUNITY): Payer: Medicare Other

## 2015-08-11 DIAGNOSIS — D631 Anemia in chronic kidney disease: Secondary | ICD-10-CM | POA: Diagnosis not present

## 2015-08-11 DIAGNOSIS — N186 End stage renal disease: Secondary | ICD-10-CM | POA: Diagnosis not present

## 2015-08-11 DIAGNOSIS — M546 Pain in thoracic spine: Secondary | ICD-10-CM | POA: Insufficient documentation

## 2015-08-11 DIAGNOSIS — N2581 Secondary hyperparathyroidism of renal origin: Secondary | ICD-10-CM | POA: Diagnosis not present

## 2015-08-11 DIAGNOSIS — M4624 Osteomyelitis of vertebra, thoracic region: Secondary | ICD-10-CM | POA: Diagnosis not present

## 2015-08-11 DIAGNOSIS — M549 Dorsalgia, unspecified: Secondary | ICD-10-CM | POA: Diagnosis present

## 2015-08-11 DIAGNOSIS — Z992 Dependence on renal dialysis: Secondary | ICD-10-CM | POA: Diagnosis not present

## 2015-08-11 DIAGNOSIS — M8448XA Pathological fracture, other site, initial encounter for fracture: Secondary | ICD-10-CM | POA: Diagnosis not present

## 2015-08-11 DIAGNOSIS — M7918 Myalgia, other site: Secondary | ICD-10-CM | POA: Diagnosis present

## 2015-08-11 DIAGNOSIS — M5127 Other intervertebral disc displacement, lumbosacral region: Secondary | ICD-10-CM | POA: Diagnosis not present

## 2015-08-11 DIAGNOSIS — I120 Hypertensive chronic kidney disease with stage 5 chronic kidney disease or end stage renal disease: Secondary | ICD-10-CM | POA: Diagnosis not present

## 2015-08-11 LAB — GLUCOSE, CAPILLARY
GLUCOSE-CAPILLARY: 147 mg/dL — AB (ref 65–99)
GLUCOSE-CAPILLARY: 153 mg/dL — AB (ref 65–99)
GLUCOSE-CAPILLARY: 502 mg/dL — AB (ref 65–99)
Glucose-Capillary: 175 mg/dL — ABNORMAL HIGH (ref 65–99)
Glucose-Capillary: 225 mg/dL — ABNORMAL HIGH (ref 65–99)
Glucose-Capillary: 221 mg/dL — ABNORMAL HIGH (ref 65–99)

## 2015-08-11 LAB — SEDIMENTATION RATE: Sed Rate: 132 mm/hr — ABNORMAL HIGH (ref 0–16)

## 2015-08-11 LAB — C-REACTIVE PROTEIN: CRP: 13 mg/dL — AB (ref <1.0)

## 2015-08-11 MED ORDER — OXYCODONE-ACETAMINOPHEN 5-325 MG PO TABS
1.0000 | ORAL_TABLET | ORAL | Status: DC | PRN
  Administered 2015-08-11 – 2015-08-12 (×3): 2 via ORAL
  Filled 2015-08-11 (×4): qty 2

## 2015-08-11 MED ORDER — ASPIRIN EC 81 MG PO TBEC
81.0000 mg | DELAYED_RELEASE_TABLET | Freq: Every day | ORAL | Status: DC
  Administered 2015-08-11 – 2015-08-12 (×2): 81 mg via ORAL
  Filled 2015-08-11 (×2): qty 1

## 2015-08-11 MED ORDER — INSULIN ASPART 100 UNIT/ML ~~LOC~~ SOLN
0.0000 [IU] | Freq: Three times a day (TID) | SUBCUTANEOUS | Status: DC
  Administered 2015-08-11: 4 [IU] via SUBCUTANEOUS
  Administered 2015-08-11: 7 [IU] via SUBCUTANEOUS
  Administered 2015-08-11 – 2015-08-12 (×2): 4 [IU] via SUBCUTANEOUS

## 2015-08-11 MED ORDER — AMLODIPINE BESYLATE 10 MG PO TABS
10.0000 mg | ORAL_TABLET | Freq: Every day | ORAL | Status: DC
  Administered 2015-08-11 – 2015-08-12 (×2): 10 mg via ORAL
  Filled 2015-08-11 (×2): qty 1

## 2015-08-11 MED ORDER — CALCIUM ACETATE (PHOS BINDER) 667 MG PO CAPS
1334.0000 mg | ORAL_CAPSULE | Freq: Three times a day (TID) | ORAL | Status: DC
  Administered 2015-08-11 – 2015-08-17 (×10): 1334 mg via ORAL
  Filled 2015-08-11 (×9): qty 2

## 2015-08-11 MED ORDER — GABAPENTIN 300 MG PO CAPS
300.0000 mg | ORAL_CAPSULE | Freq: Three times a day (TID) | ORAL | Status: DC
  Administered 2015-08-11 – 2015-08-12 (×4): 300 mg via ORAL
  Filled 2015-08-11 (×4): qty 1

## 2015-08-11 MED ORDER — HYDROMORPHONE HCL 1 MG/ML IJ SOLN
1.0000 mg | INTRAMUSCULAR | Status: DC | PRN
  Administered 2015-08-11: 1 mg via INTRAVENOUS
  Filled 2015-08-11: qty 1

## 2015-08-11 MED ORDER — DIAZEPAM 5 MG/ML IJ SOLN
5.0000 mg | Freq: Once | INTRAMUSCULAR | Status: AC
  Administered 2015-08-11: 5 mg via INTRAVENOUS
  Filled 2015-08-11 (×2): qty 2

## 2015-08-11 MED ORDER — CEFAZOLIN SODIUM-DEXTROSE 2-3 GM-% IV SOLR: 2.0000 g | Freq: Once | INTRAVENOUS | Status: DC

## 2015-08-11 MED ORDER — CALCIUM ACETATE (PHOS BINDER) 667 MG/5ML PO SOLN
1334.0000 mg | Freq: Three times a day (TID) | ORAL | Status: DC
  Administered 2015-08-11: 1334 mg via ORAL
  Filled 2015-08-11 (×8): qty 10

## 2015-08-11 MED ORDER — HEPARIN SODIUM (PORCINE) 5000 UNIT/ML IJ SOLN
5000.0000 [IU] | Freq: Three times a day (TID) | INTRAMUSCULAR | Status: DC
  Administered 2015-08-11 – 2015-08-17 (×17): 5000 [IU] via SUBCUTANEOUS
  Filled 2015-08-11 (×12): qty 1

## 2015-08-11 NOTE — ED Notes (Signed)
Tilda Burrow, RN at bedside attempting to initiate IV access.

## 2015-08-11 NOTE — Progress Notes (Signed)
New Admission Note  Arrival: Pt arrived the unit via stretcher Mental Orientation: Alert & oriented x 4 Telemetry: Assessment:  Non tele Skin: No Skin issue verified by 2nd RN Nikki IV: Left arm  Pain: C/o of pain Pain meds given.  Safety measures:  verbalized understanding. Bed in lowest position. Yellow bracelet on. Non-skid socks on. Bed alarm on. Family: No family at bedside Orders have been reviewed and implemented. Will continue to monitor.

## 2015-08-11 NOTE — ED Provider Notes (Signed)
LBP x 1 month Seen multiple times over that time - treated with flexeril without relief. Tramadol as well.  Increased pain with bilateral LE weakness Exam - weak right LE CT thoracic and lumbar - pending   Plan: ? Admit for pain control and further work up.  Morphine given here with some relief.  Patient refusing MRI despite sedation  Patient's CT read as follows:  Destructive process centered at the T4-5 level with associated osseous destruction and fragmentation of both the T4 and T5 vertebral bodies with focal kyphosis of the thoracic spine. Primary concern is for possible osteomyelitis discitis/infection. Dialysis induced spondyloarthropathy could also be considered in this patient on dialysis. Possible malignancy could also conceivably have this appearance but is less favored given the lack of additional metastatic lesions. There is abnormal soft tissue density within the ventral epidural space at this level with secondary moderate to severe cord compression. Follow-up examination with dedicated MRI may be helpful for further evaluation.  Discussed with Dr. Trenton Gammon (neurosurgery) who advises medical admit for MRI under sedation as required, in order to further clarify patient's condition. Discussed with OPC who accepts the patient for admission and further work up.  Charlann Lange, PA-C 08/14/15 [place] Yao, MD 08/15/15 [phone]

## 2015-08-11 NOTE — Consult Note (Signed)
Reason for Consult: Thoracic back pain Referring Physician: Teaching service  Seth [name] is an 49 y.o. male.  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 No history of trauma. No history of fever or obvious infection. Pain worsens with standing or walking. Reasonably mild at rest. He occasionally note some radiating pain and paresthesias into his chest wall anteriorly. Yesterday he had some transient paresthesias into his lower extremities. He is not having any symptoms of motor loss. He is on renal dialysis. He reports no loss of bowel control.  Past Medical History  Diagnosis Date  . Hypertension   . ESRD (end stage renal disease) on dialysis ([place])   . 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 Sleep apnea     USES CPAP  . Diabetes mellitus     INSULIN DEPENDENT DIABETES    Past Surgical History  Procedure Laterality Date  . Av fistula placement  05/03/2012    Procedure: ARTERIOVENOUS (AV) FISTULA CREATION;  Surgeon: Mal [name], MD;  Location: [facility];  Service: Vascular;  Laterality: Right;  . Tracheostomy tube placement N/A 08/20/2013    Procedure: TRACHEOSTOMY Revision;  Surgeon: Melida Quitter, MD;  Location: [facility];  Service: ENT;  Laterality: N/A;  . Tee without cardioversion N/A 07/10/2015    Procedure: TRANSESOPHAGEAL ECHOCARDIOGRAM (TEE);  Surgeon: Josue Hector, MD;  Location: [facility] ENDOSCOPY;  Service: Cardiovascular;  Laterality: N/A;    Family History  Problem Relation Age of Onset  . Diabetes Mother   . Diabetes Sister   . Diabetes Brother     Social History:  reports that he has never smoked. He has never used smokeless tobacco. He reports that he drinks about 0.6 oz of alcohol per week. He reports that he uses illicit drugs (Marijuana).  Allergies: No Known Allergies  Medications: I have reviewed the patient's  current medications.  Results for orders placed or performed during the hospital encounter of 08/10/15 (from the past 48 hour(s))  CBC with Differential     Status: Abnormal   Collection Time: 08/10/15 10:17 PM  Result Value Ref Range   WBC 13.5 (H) 4.0 - 10.5 K/uL   RBC 3.21 (L) 4.22 - 5.81 MIL/uL   Hemoglobin 10.1 (L) 13.0 - 17.0 g/dL   HCT 31.3 (L) 39.0 - 52.0 %   MCV 97.5 78.0 - 100.0 fL   MCH 31.5 26.0 - 34.0 pg   MCHC 32.3 30.0 - 36.0 g/dL   RDW 15.8 (H) 11.5 - 15.5 %   Platelets 333 150 - 400 K/uL   Neutrophils Relative % 80 %   Neutro Abs 10.7 (H) 1.7 - 7.7 K/uL   Lymphocytes Relative 14 %   Lymphs Abs 1.9 0.7 - 4.0 K/uL   Monocytes Relative 5 %   Monocytes Absolute 0.7 0.1 - 1.0 K/uL   Eosinophils Relative 1 %   Eosinophils Absolute 0.2 0.0 - 0.7 K/uL   Basophils Relative 0 %   Basophils Absolute 0.0 0.0 - 0.1 K/uL  Comprehensive metabolic panel     Status: Abnormal   Collection Time: 08/10/15 10:17 PM  Result Value Ref Range   Sodium 132 (L) 135 - 145 mmol/L   Potassium 3.4 (L) 3.5 - 5.1 mmol/L   Chloride 88 (L) 101 - 111 mmol/L   CO2 31 22 - 32 mmol/L   Glucose,  Bld 165 (H) 65 - 99 mg/dL   BUN 19 6 - 20 mg/dL   Creatinine, Ser 6.73 (H) 0.61 - 1.24 mg/dL   Calcium 8.9 8.9 - 10.3 mg/dL   Total Protein 8.5 (H) 6.5 - 8.1 g/dL   Albumin 3.8 3.5 - 5.0 g/dL   AST 14 (L) 15 - 41 U/L   ALT 10 (L) 17 - 63 U/L   Alkaline Phosphatase 99 38 - 126 U/L   Total Bilirubin 0.5 0.3 - 1.2 mg/dL   GFR calc non Af Amer 9 (L) >60 mL/min   GFR calc Af Amer 10 (L) >60 mL/min    Comment: (NOTE) The eGFR has been calculated using the CKD EPI equation. This calculation has not been validated in all clinical situations. eGFR's persistently <60 mL/min signify possible Chronic Kidney Disease.    Anion gap 13 5 - 15  I-Stat CG4 Lactic Acid, ED     Status: None   Collection Time: 08/10/15 10:23 PM  Result Value Ref Range   Lactic Acid, Venous 1.19 0.5 - 2.0 mmol/L  Glucose, capillary      Status: Abnormal   Collection Time: 08/11/15  5:38 AM  Result Value Ref Range   Glucose-Capillary 147 (H) 65 - 99 mg/dL  Glucose, capillary     Status: Abnormal   Collection Time: 08/11/15  7:46 AM  Result Value Ref Range   Glucose-Capillary 175 (H) 65 - 99 mg/dL  C-reactive protein     Status: Abnormal   Collection Time: 08/11/15  8:27 AM  Result Value Ref Range   CRP 13.0 (H) <1.0 mg/dL    Ct Thoracic Spine Wo Contrast  08/11/2015  CLINICAL DATA:  Initial evaluation for severe upper back pain with tingling in both legs, worse on the right. EXAM: CT THORACIC AND LUMBAR SPINE WITHOUT CONTRAST TECHNIQUE: Multidetector CT imaging of the thoracic and lumbar spine was performed without contrast. Multiplanar CT image reconstructions were also generated. COMPARISON:  None available. FINDINGS: CT THORACIC SPINE FINDINGS First rib-bearing vertebral body is labeled T1. There is abnormal osseous destruction with fragmentation of the T4 and T5 vertebral bodies (series 7, image 21). There is associated severe height loss with osseous fragmentation. There is associated retropulsion of osseous fragments into the spinal canal by at least 8 mm. Additionally, there is probable abnormal soft tissue density within the ventral epidural space at the level of T4 and T5. There is moderate to severe canal stenosis with probable cord compression. Cord appears displaced to the left. Extension into the posterior elements with erosive changes of the bilateral pedicles of T5, and left pedicle of T4. There is abnormal soft tissue about the T4 and T5 vertebral bodies within the paraspinous soft tissue as well. Erosive changes within the bilateral posteromedial fourth ribs. The T4-5 intervertebral disc space is largely obliterated. Findings most concerning for possible osteomyelitis discitis/infection. Underlying malignancy could also be considered. No other worrisome focal osseous lesions identified. Heterogeneous lucent lesion  with sclerotic margins within the T9 vertebral body most consistent with a benign hemangioma. No other acute abnormality within the thoracic spine. No other significant degenerative changes appreciated. Paraspinous soft tissues otherwise within normal limits. CT LUMBAR SPINE FINDINGS Grade 1 anterolisthesis of L4 on L5. Transitional lumbosacral anatomy with partial sacralization of the L5 vertebral body. Vertebral bodies otherwise normally aligned. Vertebral body heights preserved. No fracture. Single 7 mm lucent lesion within the anterior aspect of L3, indeterminate, but of doubtful clinical significance. No other focal osseous lesions.  Paraspinous soft tissues within normal limits. There is question of a a nonobstructive stone within the inferior pole the right kidney. No retroperitoneal adenopathy. L4-5: 4 mm anterolisthesis. No associated pars defect. Severe bilateral facet arthrosis. Diffuse disc bulge without definite focal disc herniation. Ligamentum flavum hypertrophy. Mild to moderate canal stenosis with probable at least moderate bilateral foraminal narrowing. L5-S1: Degenerative changes present at the anterior aspect of the L5 vertebral body. Mild disc bulge without significant stenosis. IMPRESSION: CT THORACIC SPINE IMPRESSION 1. Destructive process centered at the T4-5 level with associated osseous destruction and fragmentation of both the T4 and T5 vertebral bodies with focal kyphosis of the thoracic spine. Primary concern is for possible osteomyelitis discitis/infection. Dialysis induced spondyloarthropathy could also be considered in this patient on dialysis. Possible malignancy could also conceivably have this appearance but is less favored given the lack of additional metastatic lesions. There is abnormal soft tissue density within the ventral epidural space at this level with secondary moderate to severe cord compression. Follow-up examination with dedicated MRI may be helpful for further  evaluation. CT LUMBAR SPINE IMPRESSION: 1. No acute process within the lumbar spine. 2. Grade 1 anterolisthesis at L4-5 with associated disc bulge and facet arthrosis with moderate canal and bilateral foraminal stenosis. 3. Mild degenerative changes at L5-S1 without stenosis. 4. Transitional lumbosacral anatomy with partial sacralization of the L5 vertebral body. Careful correlation with numbering system on this study recommended prior to any potential surgical intervention. Critical Value/emergent results were called by telephone at the time of interpretation on 08/11/2015 at 1:59 am to Dr. Leonides Schanz , who verbally acknowledged these results. Electronically Signed   By: Jeannine Boga M.D.   On: 08/11/2015 02:12   Ct Lumbar Spine Wo Contrast  08/11/2015  CLINICAL DATA:  Initial evaluation for severe upper back pain with tingling in both legs, worse on the right. EXAM: CT THORACIC AND LUMBAR SPINE WITHOUT CONTRAST TECHNIQUE: Multidetector CT imaging of the thoracic and lumbar spine was performed without contrast. Multiplanar CT image reconstructions were also generated. COMPARISON:  None available. FINDINGS: CT THORACIC SPINE FINDINGS First rib-bearing vertebral body is labeled T1. There is abnormal osseous destruction with fragmentation of the T4 and T5 vertebral bodies (series 7, image 21). There is associated severe height loss with osseous fragmentation. There is associated retropulsion of osseous fragments into the spinal canal by at least 8 mm. Additionally, there is probable abnormal soft tissue density within the ventral epidural space at the level of T4 and T5. There is moderate to severe canal stenosis with probable cord compression. Cord appears displaced to the left. Extension into the posterior elements with erosive changes of the bilateral pedicles of T5, and left pedicle of T4. There is abnormal soft tissue about the T4 and T5 vertebral bodies within the paraspinous soft tissue as well. Erosive  changes within the bilateral posteromedial fourth ribs. The T4-5 intervertebral disc space is largely obliterated. Findings most concerning for possible osteomyelitis discitis/infection. Underlying malignancy could also be considered. No other worrisome focal osseous lesions identified. Heterogeneous lucent lesion with sclerotic margins within the T9 vertebral body most consistent with a benign hemangioma. No other acute abnormality within the thoracic spine. No other significant degenerative changes appreciated. Paraspinous soft tissues otherwise within normal limits. CT LUMBAR SPINE FINDINGS Grade 1 anterolisthesis of L4 on L5. Transitional lumbosacral anatomy with partial sacralization of the L5 vertebral body. Vertebral bodies otherwise normally aligned. Vertebral body heights preserved. No fracture. Single 7 mm lucent lesion within the anterior aspect  of L3, indeterminate, but of doubtful clinical significance. No other focal osseous lesions. Paraspinous soft tissues within normal limits. There is question of a a nonobstructive stone within the inferior pole the right kidney. No retroperitoneal adenopathy. L4-5: 4 mm anterolisthesis. No associated pars defect. Severe bilateral facet arthrosis. Diffuse disc bulge without definite focal disc herniation. Ligamentum flavum hypertrophy. Mild to moderate canal stenosis with probable at least moderate bilateral foraminal narrowing. L5-S1: Degenerative changes present at the anterior aspect of the L5 vertebral body. Mild disc bulge without significant stenosis. IMPRESSION: CT THORACIC SPINE IMPRESSION 1. Destructive process centered at the T4-5 level with associated osseous destruction and fragmentation of both the T4 and T5 vertebral bodies with focal kyphosis of the thoracic spine. Primary concern is for possible osteomyelitis discitis/infection. Dialysis induced spondyloarthropathy could also be considered in this patient on dialysis. Possible malignancy could also  conceivably have this appearance but is less favored given the lack of additional metastatic lesions. There is abnormal soft tissue density within the ventral epidural space at this level with secondary moderate to severe cord compression. Follow-up examination with dedicated MRI may be helpful for further evaluation. CT LUMBAR SPINE IMPRESSION: 1. No acute process within the lumbar spine. 2. Grade 1 anterolisthesis at L4-5 with associated disc bulge and facet arthrosis with moderate canal and bilateral foraminal stenosis. 3. Mild degenerative changes at L5-S1 without stenosis. 4. Transitional lumbosacral anatomy with partial sacralization of the L5 vertebral body. Careful correlation with numbering system on this study recommended prior to any potential surgical intervention. Critical Value/emergent results were called by telephone at the time of interpretation on 08/11/2015 at 1:59 am to Dr. Leonides Schanz , who verbally acknowledged these results. Electronically Signed   By: Jeannine Boga M.D.   On: 08/11/2015 02:12    Pertinent items are noted in HPI. Blood pressure 139/75, pulse 89, temperature 97.8 F (36.6 C), temperature source Oral, resp. rate 18, height $RemoveBe'5\' 8"'pGNEbVEFr$  (1.727 m), weight 138.801 kg (306 lb), SpO2 90 %. The patient is awake and alert. He is oriented and appropriate. He is not appear to be toxic. His speech is fluent. Cranial nerve function is intact. Motor examination of the upper and lower extremities are normal albeit his lower extremity motor function is somewhat limited by pain. Sensory examination is nonfocal. Reflexes are hypoactive and symmetric. No evidence of long track signs. Examination head ears eyes others are unremarkable. Chest and abdomen are benign.  Assessment/Plan: Patient has a destructive process ongoing at T4 and T5 with significant pathologic fractures of both vertebral bodies. Differential diagnosis at this point includes osteomyelitis (although unusual for to involve more  than 1 vertebral body), renal osteodystrophy free with secondary pathologic fracture, or metastatic disease with pathologic fracture. Patient needs an MRI scan of his thoracic spine to better characterize this lesion. He probably will also require needle biopsy for sampling. Currently no evidence of impending neurologic loss. Continue close observation.  Lutricia Widjaja A 08/11/2015, 9:50 AM

## 2015-08-11 NOTE — Progress Notes (Signed)
The patient was able to undergo MRI scan of thoracic spine. This confirms the destructive process ongoing at T4 and T5. There is retropulsion of disc material and inflammatory tissue mixed with bone into the spinal canal with moderately severe stenosis and spinal cord compression. 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 The patient continues to do well neurologically. No current symptoms of numbness, paresthesias or weakness but patient has been at bedrest throughout entire day.  The situation is obviously difficult secondary to the patient's overall medical  and body habitus. I am uncomfortable treating this process with antibiotics alone. I believe he is best served by decompression and fusion surgery. I have discussed with him the possibility of undergoing a T4 and T5 laminectomy with evacuation of epidural abscess with transpedicular decompression on the left. This would be followed by posterior segmental fusion from T2-T7 utilizing pedicle screw instrumentation and local autograft. I've discussed the risks and benefits involved with this surgery including but not limited to the risk of anesthesia, bleeding, infection, CSF leak, nerve root injury, spinal cord injury, fusion failure, instrumentation player, continued pain, and non-benefit. The patient has been given the opportunity to ask questions. He appears to understand. He agrees to proceed with surgery. Tentatively plan for surgery around 5:00 tomorrow evening. Patient hopefully may be dialyzed and medically cleared prior to surgery.

## 2015-08-11 NOTE — Assessment & Plan Note (Addendum)
Patient presenting with a one-month history of progressive spinal pain in the thoracic area. Describes the pain as being constant and "nagging, aching" in nature, worse with movement. Denies having any trauma to the back or any history of back problems. Denies having any weakness, numbness, or tingling sensation in his bilateral upper extremities. States he has tried taking Tylenol, Naproxen, Toradol, and Flexeril in the past and none of these medications helped with the pain. Denies having any fevers, chills, nausea, vomiting, chest pain, or shortness of breath. Denies any "alarm symptoms"such as urinary or fecal incontinence. Patient also reports having numbness and tingling in his chest, entire back, groin area, and thighs bilaterally. Physical examination remarkable for tenderness on palpation of the spine and paraspinal muscles in the thoracic area only. Patient is experiencing pain with any movement of the spine. X-ray of spine from September 2016 showing chronic compression deformity on the anterior aspect of an upper thoracic vertebral body. His symptoms are concerning for possible osteomyelitis vs. 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   - The numbness and tingling sensation on his chest, back, groin, and thighs could possibly be neuropathic in nature and patient has been prescribed Gabapentin 300 mg 3 times a day  -Patient was afebrile during the visit. However, leukocytosis had to be ruled out. Labs (CBC and ESR) requested to be done at next dialysis session.   -Stat MRI of the thoracic spine was ordered at this visit, however, could not be done because patient was claustrophobic. CT was done instead and showed a destructive process centered at the T4-5 level with associated osseous destruction and fragmentation of both the T4 and T5 vertebral bodies. In addition, there was abnormal soft tissue density within the ventral  epidural space at this level with secondary moderate to severe cord compression. Findings concerning for osteomyelitis vs. renal osteodystrophy (patient is ESRD on HD) vs. metastatic disease.  -Patient is currently hospitalized and under the care of [facility]. They plan on giving anxiolytic medications/possible sedation so that patient can go for an MRI of his spine.

## 2015-08-11 NOTE — Progress Notes (Signed)
[name], male   DOB: [birth year], 49 y.o.   MRN: [id number]   Subjective:   [name] male   DOB: 03/10/1966 49 y.o.   MRN: [id number]  HPI: [name] is a 49 y.o. male with a past medical history of hypertension, ESRD, diabetes presenting to the clinic with a one-month history of pain between his shoulder blades. States the pain is constant and "nagging, aching" in nature and has been getting progressively worse. States the pain is worse with movement. Denies having any trauma to the back or any history of back problems. Denies having any weakness, numbness, or tingling sensation in his bilateral upper extremities. States he has tried taking Tylenol, Naproxen, Toradol, and Flexeril in the past and none of these medications helped with the pain. Denies having any fevers, chills, nausea, vomiting, chest pain, or shortness of breath. Denies any "alarm symptoms" such as urinary or fecal incontinence. Patient also reports having numbness and tingling in his chest, entire back, groin area, and thighs bilaterally.    Past Medical History  Diagnosis Date  . Hypertension   . ESRD (end stage renal disease) on dialysis ([place])   . 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 Sleep apnea     USES CPAP  . Diabetes mellitus     INSULIN DEPENDENT DIABETES  . Discitis 07/2015   No current facility-administered medications for this visit.   No current outpatient prescriptions on file.   Facility-Administered Medications Ordered in Other Visits  Medication Dose Route Frequency Provider Last Rate Last Dose  . amLODipine (NORVASC) tablet 10 mg  10 mg Oral Daily Rushil Sherrye Payor, MD   10 mg at 08/11/15 0958  . aspirin EC tablet 81 mg  81 mg Oral Daily Rushil Sherrye Payor, MD   81 mg at 08/11/15 0958  . calcium acetate (PHOSLO) capsule 1,334 mg  1,334 mg Oral TID WC Lauren D Bajbus, RPH      . gabapentin (NEURONTIN) capsule  300 mg  300 mg Oral TID Riccardo Dubin, MD   300 mg at 08/11/15 0958  . heparin injection 5,000 Units  5,000 Units Subcutaneous 3 times per day Riccardo Dubin, MD   5,000 Units at 08/11/15 1405  . insulin aspart (novoLOG) injection 0-20 Units  0-20 Units Subcutaneous TID WC Rushil Sherrye Payor, MD   7 Units at 08/11/15 1400  . oxyCODONE-acetaminophen (PERCOCET/ROXICET) 5-325 MG per tablet 1-2 tablet  1-2 tablet Oral Q4H PRN Dellia Nims, MD       Family History  Problem Relation Age of Onset  . Diabetes Mother   . Diabetes Sister   . Diabetes Brother    Social History   Social History  . Marital Status: Single    Spouse Name: N/A  . Number of Children: N/A  . Years of Education: N/A   Social History Main Topics  . Smoking status: Never Smoker   . Smokeless tobacco: Never Used  . Alcohol Use: 0.6 oz/week    1 Cans of beer per week  . Drug Use: Yes    Special: Marijuana     Comment: ocassionaly   . Sexual Activity: Not Currently   Other Topics Concern  . 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  Was in operations control.  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  Divorce with 5 children, 3 boys and 2 girls.  Lives in [place].    Review of Systems: Review of Systems  Constitutional: Negative for fever and chills.  HENT: Negative for ear pain.   Eyes: Negative for blurred vision and pain.  Respiratory: Negative for cough, shortness of breath and wheezing.   Cardiovascular: Negative for chest pain and leg swelling.  Gastrointestinal: Negative for nausea, vomiting and abdominal pain.  Musculoskeletal: Positive for back pain. Negative for myalgias.  Skin: Negative for itching and rash.  Neurological: Positive for tingling and sensory change. Negative for dizziness, focal weakness and headaches.   Objective:  Physical Exam: Filed Vitals:   08/10/15 1343  BP: 150/75  Pulse: 95    Temp: 98.4 F (36.9 C)  TempSrc: Oral  Height: 5\' 8"  (1.727 m)  Weight: 307 lb (139.254 kg)  SpO2: 97%   Physical Exam  Constitutional: He is oriented to person, place, and time. He appears well-developed and well-nourished.  HENT:  Head: Normocephalic and atraumatic.  Eyes: EOM are normal. Pupils are equal, round, and reactive to light.  Neck: Neck supple. No tracheal deviation present.  Cardiovascular: Normal rate, regular rhythm and intact distal pulses.   Pulmonary/Chest: Effort normal and breath sounds normal. No respiratory distress. He has no wheezes. He has no rales.  Abdominal: Soft. Bowel sounds are normal. He exhibits no distension. There is no tenderness.  Musculoskeletal:  Tenderness on palpation of spine and paraspinal muscles in the thoracic area only. Pain worse with any movement of the spine. Strength and sensation grossly intact in bilateral upper extremities.  Neurological: He is alert and oriented to person, place, and time.  Skin: Skin is warm and dry.   Assessment & Plan:

## 2015-08-11 NOTE — Assessment & Plan Note (Addendum)
CBG 153. Diabetes to be discussed at next visit.

## 2015-08-11 NOTE — ED Notes (Signed)
IV team at bedside 

## 2015-08-11 NOTE — Progress Notes (Signed)
ANTIBIOTIC CONSULT NOTE - INITIAL  Pharmacy Consult for cefzolin Indication: osteomyelitis   No Known Allergies  Patient Measurements: Height: 5\' 8"  (172.7 cm) Weight: (!) 306 lb (138.801 kg) IBW/kg (Calculated) : 68.4 Adjusted Body Weight:   Vital Signs: Temp: 97.6 F (36.4 C) (10/31 1949) Temp Source: Oral (10/31 1949) BP: 161/104 mmHg (11/01 0400) Pulse Rate: 86 (11/01 0400) Intake/Output from previous day:   Intake/Output from this shift:    Labs:  Recent Labs  08/10/15 2217  WBC 13.5*  HGB 10.1*  PLT 333  CREATININE 6.73*   Estimated Creatinine Clearance: 17.7 mL/min (by C-G formula based on Cr of 6.73). No results for input(s): VANCOTROUGH, VANCOPEAK, VANCORANDOM, GENTTROUGH, GENTPEAK, GENTRANDOM, TOBRATROUGH, TOBRAPEAK, TOBRARND, AMIKACINPEAK, AMIKACINTROU, AMIKACIN in the last 72 hours.   Microbiology: Recent Results (from the past 720 hour(s))  Culture, blood (routine x 2)     Status: None   Collection Time: 07/16/15 12:00 PM  Result Value Ref Range Status   Specimen Description BLOOD A-LINE DRAW  Final   Special Requests   Final    BOTTLES DRAWN AEROBIC AND ANAEROBIC 3CC AEROBIC,3CC ANAEROBIC   Culture NO GROWTH 5 DAYS  Final   Report Status 07/21/2015 FINAL  Final  Culture, blood (routine x 2)     Status: None   Collection Time: 07/16/15 12:15 PM  Result Value Ref Range Status   Specimen Description BLOOD A-LINE DRAW  Final   Special Requests   Final    BOTTLES DRAWN AEROBIC AND ANAEROBIC 3CC AEROBIC,3CC ANAEROBIC   Culture NO GROWTH 5 DAYS  Final   Report Status 07/21/2015 FINAL  Final    Medical History: Past Medical History  Diagnosis Date  . Diabetes mellitus   . Hypertension   . Chronic kidney disease   . 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 Sleep apnea     Medications:  See EMR  Assessment: 49 yo male ESRD recent staph lugdunesis bacteremia, s/p 4 weeks of treatment, now  initiating abx for osteomyelitis. WBC 13.51, LA 1.1, afebrile.  Goal of Therapy:  Resolution of infection  Plan:  -Cefazolin 2 g IV x1 then 2 g with HD -Monitor cultures, HD schedule  Hughes Better, PharmD, BCPS Clinical Pharmacist Pager: [phone] 08/11/2015 4:29 AM

## 2015-08-11 NOTE — Consult Note (Signed)
Seth [name] 08/12/2015 [name]:  Orie Fisherman  Reason for Consult:  ESRD, Spinal Cord Destruction HPI:  49 year old male seen at the request of Dr. Dareen Piano for the evaluation of the above issues. Patient has ESRD rec iHD MWF via aVF at [facility] with [place]. and was admitted yesterday after evaluation in his primary care office for progressive back pain. CT imaging of the back demonstrated pathologic fractures of T4 and T5 vertebral bodies. He has been seen by neurosurgery and evaluation is ongoing for etiology, presumed osteomyelitis or malignancy.  MRI overnight consistent with OM, tentative plan for decomperssion this evening.    He reports no recent events HD concerns or issues.     Filed Weights   08/10/15 1949 08/11/15 2100 08/12/15 0700  Weight: 138.801 kg (306 lb) 138.8 kg (306 lb) 140.9 kg (310 lb 10.1 oz)    I/O last 3 completed shifts: In: 220 [P.O.:120; Other:100] Out: 0   ROS Balance of 12 systems is negative w/ exceptions as above  Outpt HD Orders Unit: [facility] Days: MWF Time: 4h Dialyzer: Elisio EDW: 138kg K/Ca: 2/2.5 Access: AVF, RFA Needle Size: 14g BFR/DFR: 500/800 VDRA: Hectorol 38mcg qTx EPO: Epogen 5000 units qTx Heparin: 4500 IU at start, then infusion 1000 IU/hr  PMH  Past Medical History  Diagnosis Date  . Hypertension   . ESRD (end stage renal disease) on dialysis ([place])   . 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 Sleep apnea     USES CPAP  . Diabetes mellitus     INSULIN DEPENDENT DIABETES  . Discitis 07/2015   PSH  Past Surgical History  Procedure Laterality Date  . Av fistula placement  05/03/2012    Procedure: ARTERIOVENOUS (AV) FISTULA CREATION;  Surgeon: Mal [name], MD;  Location: [place];  Service: Vascular;  Laterality: Right;  . Tracheostomy tube placement N/A 08/20/2013    Procedure: TRACHEOSTOMY  Revision;  Surgeon: Melida Quitter, MD;  Location: [place];  Service: ENT;  Laterality: N/A;  . Tee without cardioversion N/A 07/10/2015    Procedure: TRANSESOPHAGEAL ECHOCARDIOGRAM (TEE);  Surgeon: Josue Hector, MD;  Location: [facility] ENDOSCOPY;  Service: Cardiovascular;  Laterality: N/A;   FH  Family History  Problem Relation Age of Onset  . Diabetes Mother   . Diabetes Sister   . Diabetes Brother    SH  reports that he has never smoked. He has never used smokeless tobacco. He reports that he drinks about 0.6 oz of alcohol per week. He reports that he uses illicit drugs (Marijuana). Allergies No Known Allergies Home medications Prior to Admission medications   Medication Sig Start Date End Date Taking? Authorizing Provider  amLODipine (NORVASC) 10 MG tablet Take 1 tablet (10 mg total) by mouth daily. 09/19/13  Yes Donita Brooks, NP  aspirin EC 81 MG tablet Take 81 mg by mouth daily.   Yes Historical Provider, MD  calcium acetate (PHOSLO) 667 MG capsule Take 2 capsules (1,334 mg total) by mouth 3 (three) times daily with meals. 09/19/13  Yes Donita Brooks, NP  Cholecalciferol (VITAMIN D PO) Take 1 tablet by mouth daily.   Yes Historical Provider, MD  cyclobenzaprine (FLEXERIL) 10 MG tablet Take 1 tablet (10 mg total) by mouth 3 (three) times daily as needed for muscle spasms. 08/05/15  Yes Zada Finders, MD  insulin aspart (NOVOLOG) 100 UNIT/ML injection  Inject 0-15 Units into the skin 3 (three) times daily with meals. CBG < 70: drink orange juice and recheck glucose or use glucose tabs CBG > 400: call MD   CBG 70 - 120: 0 units CBG 121 - 150: 3 units CBG 151 - 200: 4 units CBG 201 - 250: 7 units CBG 251 - 300: 11 units CBG 301 - 350: 15 units CBG 351 - 400: 20 units 09/19/13  Yes Donita Brooks, NP  albuterol (PROVENTIL) (2.5 MG/3ML) 0.083% nebulizer solution Take 3 mLs (2.5 mg total) by nebulization every 6 (six) hours as needed for wheezing or shortness of breath. Patient not taking:  Reported on 07/17/2015 07/14/15   Jones Bales, MD  ceFAZolin (ANCEF) 2-3 GM-% SOLR Inject 50 mLs (2 g total) into the vein every Monday, Wednesday, and Friday with hemodialysis. Patient not taking: Reported on 08/05/2015 07/14/15   Iline Oven, MD  Darbepoetin Alfa (ARANESP) 150 MCG/0.3ML SOSY injection Inject 0.3 mLs (150 mcg total) into the vein every Monday with hemodialysis. 07/14/15   Iline Oven, MD  diazepam (VALIUM) 2 MG tablet Take 1 tablet (2 mg total) by mouth every 8 (eight) hours as needed for muscle spasms. Patient not taking: Reported on 07/17/2015 06/17/15   Loleta Chance, MD  gabapentin (NEURONTIN) 300 MG capsule Take 1 capsule (300 mg total) by mouth 3 (three) times daily. 08/10/15 08/09/16  Shela Leff, MD  naproxen (NAPROSYN) 500 MG tablet Take 1 tablet (500 mg total) by mouth 2 (two) times daily with a meal. Patient not taking: Reported on 08/05/2015 07/24/15 07/23/16  Jule Ser, DO  predniSONE (DELTASONE) 20 MG tablet Take 2 tablets (40 mg total) by mouth daily. Patient not taking: Reported on 07/17/2015 07/14/15   Jones Bales, MD  traMADol (ULTRAM) 50 MG tablet Take 1 tablet (50 mg total) by mouth every 6 (six) hours as needed. For back pain Patient not taking: Reported on 08/05/2015 07/29/15   Billy Fischer, MD  Vitamin D, Ergocalciferol, (DRISDOL) 50000 UNITS CAPS capsule Take 1 capsule (50,000 Units total) by mouth every 7 (seven) days. *takes on Friday* 09/19/13   Donita Brooks, NP    Current Medications Scheduled Meds: . amLODipine  10 mg Oral Daily  . aspirin EC  81 mg Oral Daily  . calcium acetate  1,334 mg Oral TID WC  . cholecalciferol  1,000 Units Oral Daily  . gabapentin  300 mg Oral TID  . heparin  5,000 Units Subcutaneous 3 times per day  . insulin aspart  0-20 Units Subcutaneous TID WC   Continuous Infusions:  PRN Meds:.sodium chloride, sodium chloride, alteplase, cyclobenzaprine, heparin, heparin, lidocaine (PF),  lidocaine-prilocaine, oxyCODONE-acetaminophen, pentafluoroprop-tetrafluoroeth  CBC  Recent Labs Lab 08/10/15 2217 08/12/15 0737  WBC 13.5* 10.0  NEUTROABS 10.7* 7.3  HGB 10.1* 9.6*  HCT 31.3* 30.0*  MCV 97.5 99.3  PLT 333 [id number]   Basic Metabolic Panel  Recent Labs Lab 08/10/15 2217 08/12/15 0737  NA 132* 131*  K 3.4* 4.0  CL 88* 86*  CO2 31 30  GLUCOSE 165* 219*  BUN 19 35*  CREATININE 6.73* 9.72*  CALCIUM 8.9 8.5*  PHOS  --  7.0*    Physical Exam  Blood pressure 128/82, pulse 93, temperature 97.9 F (36.6 C), temperature source Oral, resp. rate 18, height 5\' 8"  (1.727 m), weight 140.9 kg (310 lb 10.1 oz), SpO2 96 %. GEN: NAD, obese ENT: NCAT EYES: EOMI CV: RRR, no rub PULM: CTAB ABD: s/nt/nd  SKIN: No rashes/lesiosn EXT:No LEE   A/P 1. ESRD:  1. MWF [place] via AVF 2. Keep on schedule 3. Using Tight heparin 2. Pathoogical Fracture at T4 and T5 1. MRI pending 2. IM and NSG eval reviewed 3. 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 HTN/Vol: Aim for EDW, BP stable 4. Anemia: Hb at goal siwtch to aranesp as inpt 5. MBD: cont PhosLo, cinacaclet, check PTH and Phos  [name] MD 08/12/2015, 8:51 AM

## 2015-08-11 NOTE — ED Provider Notes (Signed)
2:00 AM  Pt is a 49 y.o. M with history of end-stage renal disease on hemodialysis, hypertension and diabetes who presents the emergency department with a month of thoracic back pain. Recently admitted to the hospital for bacteremia. Denies any fever. No numbness, tickling or focal weakness. On exam patient has a normal neuro exam with normal reflexes. Has pain with lifting his right leg but normal strength. No sensory deficit. No saddle anesthesia. Denies bowel or bladder incontinence, urinary retention. No history of injury to the back. No fever here but does have a mild leukocytosis. CT imaging shows possible discitis at T4/T5 versus bony destruction which can be seen in dialysis patients. He does have moderate to severe cord compression however. Discussed with Dr. Trenton Gammon with neurosurgery he will see the patient in consult has recommended an MRI. Discussed with teaching service for admission. Neurosurgery would like for [place] to hold on antibiotics at this time.  [place], DO 08/11/15 [phone]

## 2015-08-11 NOTE — Progress Notes (Signed)
   Subjective: Patient continues to have back pain, somewhat relieved by medication. He has difficulty sitting up and pain is worsened with movement. He was to have MRI yesterday, but could not tolerate due to claustrophobia. Says he will try the MRI today with anxiety medication. He denies any fever, chills, or urinary/bowel incontinence. Objective: Vital signs in last 24 hours: Filed Vitals:   08/11/15 0245 08/11/15 0400 08/11/15 0439 08/11/15 0912  BP: 156/88 161/104  139/75  Pulse: 88 86  89  Temp:   98.8 F (37.1 C) 97.8 F (36.6 C)  TempSrc:   Rectal Oral  Resp:    18  Height:      Weight:      SpO2: 97% 88%  90%   Weight change:   Intake/Output Summary (Last 24 hours) at 08/11/15 1119 Last data filed at 08/11/15 0913  Gross per 24 hour  Intake      0 ml  Output      0 ml  Net      0 ml   General: resting in bed Cardiac: RRR, no rubs, murmurs or gallops Pulm: clear to auscultation bilaterally, deep inspiration limited due to pain Abd: soft, nontender, BS present MSK: tender to palpation of the thoracic area at the paraspinal and spinous processes Ext: warm and well perfused, no pedal edema Neuro: alert and oriented X3, lower extremity motor strength limited due to pain, strength of b/l feet intact with flexion and extension. Patella tendon reflexes hypoactive b/l . Sensation intact.  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 He denies any trauma/injury to the area. His pain is worsened with movement and reproducible on palpation. Staph lugdunensis has been reported to cause osteomyelitis of the thoracic and lumbar spine which is a concern in this  patient. WBC prior to admission was slightly elevated at 13.5 which may be a clue towards infection. As patient is ESRD, renal osteodystrophy is another possible cause for his condition. Another consideration is malignancy resulting in his bony destruction/pathologic fracture. Patient counseled on importance of MRI to further evaluate disease process and he states he will try again with help of anxiolytics. No alarm symptoms at this time -MRI thoracic spine, with sedation as necessary -Appreciate Neurosurgery following and rec's -CRP elevated at 13.0, ESR pending. These are non-specific, significant ESR elevation may point towards osteomyelitis. -Hold antibiotics for now as patient is afebrile without signs of sepsis and will likely need biopsy for sampling. -blood cultures pending -Pain control with Percocet 5-325 mg 1-2 tablets po q4h as needed  ESRD on HD: MWF dialysis, nephrology consulted  HTN: Amlodipine 10 mg daily  T2DM: SSI  Dispo: Disposition is deferred at this time, awaiting improvement of current medical problems.    The patient does have a current PCP (Zada Finders, MD) and does need an [facility] hospital follow-up appointment after discharge.    LOS: 0 days   Zada Finders, MD 08/11/2015, 11:19 AM

## 2015-08-11 NOTE — H&P (Signed)
Date: 08/11/2015               Patient Name:  Seth [name] MRN: [id number]  DOB: [birth year] Age / Sex: 49 y.o., male   PCP: Zada Finders, MD         Medical Service: Internal Medicine Teaching Service         Attending Physician: Dr. Aldine Contes, MD    First Contact: Dr. Zada Finders, MD Pager: [phone]  Second Contact: Dr. Dellia Nims, MD Pager: [phone]       After Hours (After 5p/  First Contact Pager: [phone]  weekends / holidays): Second Contact Pager: [phone]   Chief Complaint: Back Pain  History of Present Illness:   [name] is a 49 year old man with a past medical history of ESRD (MWF HD), T2DM, HTN, degenerative disc disease, and recent hospitalization with Staph lugdunesis bacteremia (pan-sensitive, cefazolin 2g MWF at HD 9/24 - 10/11, unknown source) who presents with back pain. This started a day after his discharge on 10/4 while he was in outpatient physical therapy. While performing some exercises, he felt a pinching sensation in his back. Over time, it began to be associated with a tingling sensation that radiated to his chest anteriorly. He he had to be seen by a physician when he could no long walk long distance with a walker and started to have tingling in his lower extremities. He was seen on 10/31 in the [facility] clinic. Based on their findings in the [facility] clinic, an MRI of his back was ordered, and he was prescribed Gabapentin, which was moderately helpful. He found he could not tolerate an MRI due to claustrophobia, so a CT scan (thoracic, lumbar) was performed. A destructive process was identified at the T4-5 level consistent with possible osteomyelitis/discitis or malignancy and moderate to severe cord compression. In the ED, Dr. Trenton Gammon from Neurosurgery was called who will see the patient in consult and recommended an MRI and holding antibiotics. He was noted have a leukocytosis to 13.5 and blood cultures were drawn,  Today, he denies any bowel or bladder  incontinence, spasticity, or falls - with his only complaints per above. He also denies any fever, chills, night sweats, loss of appetite, chest pain, shortness of breath, sore throat, runny nose, nausea, vomiting, diarrhea, constipation, and dysuria. He does not smoke, drink alcohol, or use drugs, IV or otherwise.   Meds: Current Facility-Administered Medications  Medication Dose Route Frequency Provider Last Rate Last Dose  . amLODipine (NORVASC) tablet 10 mg  10 mg Oral Daily Rushil Sherrye Payor, MD      . aspirin EC tablet 81 mg  81 mg Oral Daily Rushil Patel V, MD      . ceFAZolin (ANCEF) IVPB 2 g/50 mL premix  2 g Intravenous Once Jake Church Masters, [facility]      . heparin injection 5,000 Units  5,000 Units Subcutaneous 3 times per day Charlott Rakes V, MD      . insulin aspart (novoLOG) injection 0-20 Units  0-20 Units Subcutaneous TID WC Rushil Sherrye Payor, MD       Current Outpatient Prescriptions  Medication Sig Dispense Refill  . amLODipine (NORVASC) 10 MG tablet Take 1 tablet (10 mg total) by mouth daily. 30 tablet 0  . aspirin EC 81 MG tablet Take 81 mg by mouth daily.    . calcium acetate (PHOSLO) 667 MG capsule Take 2 capsules (1,334 mg total) by mouth 3 (three) times daily with meals. [place]  capsule 0  . Cholecalciferol (VITAMIN D PO) Take 1 tablet by mouth daily.    . cyclobenzaprine (FLEXERIL) 10 MG tablet Take 1 tablet (10 mg total) by mouth 3 (three) times daily as needed for muscle spasms. 42 tablet 0  . insulin aspart (NOVOLOG) 100 UNIT/ML injection Inject 0-15 Units into the skin 3 (three) times daily with meals. CBG < 70: drink orange juice and recheck glucose or use glucose tabs CBG > 400: call MD   CBG 70 - 120: 0 units CBG 121 - 150: 3 units CBG 151 - 200: 4 units CBG 201 - 250: 7 units CBG 251 - 300: 11 units CBG 301 - 350: 15 units CBG 351 - 400: 20 units 1 vial 12  . albuterol (PROVENTIL) (2.5 MG/3ML) 0.083% nebulizer solution Take 3 mLs (2.5 mg total) by nebulization every 6  (six) hours as needed for wheezing or shortness of breath. (Patient not taking: Reported on 07/17/2015) 75 mL 12  . ceFAZolin (ANCEF) 2-3 GM-% SOLR Inject 50 mLs (2 g total) into the vein every Monday, Wednesday, and Friday with hemodialysis. (Patient not taking: Reported on 08/05/2015)    . Darbepoetin Alfa (ARANESP) 150 MCG/0.3ML SOSY injection Inject 0.3 mLs (150 mcg total) into the vein every Monday with hemodialysis. 1.68 mL 0  . diazepam (VALIUM) 2 MG tablet Take 1 tablet (2 mg total) by mouth every 8 (eight) hours as needed for muscle spasms. (Patient not taking: Reported on 07/17/2015) 30 tablet 0  . gabapentin (NEURONTIN) 300 MG capsule Take 1 capsule (300 mg total) by mouth 3 (three) times daily. 90 capsule 0  . naproxen (NAPROSYN) 500 MG tablet Take 1 tablet (500 mg total) by mouth 2 (two) times daily with a meal. (Patient not taking: Reported on 08/05/2015) 10 tablet 0  . predniSONE (DELTASONE) 20 MG tablet Take 2 tablets (40 mg total) by mouth daily. (Patient not taking: Reported on 07/17/2015) 10 tablet 0  . traMADol (ULTRAM) 50 MG tablet Take 1 tablet (50 mg total) by mouth every 6 (six) hours as needed. For back pain (Patient not taking: Reported on 08/05/2015) 15 tablet 0  . Vitamin D, Ergocalciferol, (DRISDOL) 50000 UNITS CAPS capsule Take 1 capsule (50,000 Units total) by mouth every 7 (seven) days. *takes on Friday* 30 capsule 0    Allergies: Allergies as of 08/10/2015  . (No Known Allergies)   Past Medical History  Diagnosis Date  . Diabetes mellitus   . Hypertension   . Chronic kidney disease   . 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 Sleep apnea    Past Surgical History  Procedure Laterality Date  . Av fistula placement  05/03/2012    Procedure: ARTERIOVENOUS (AV) FISTULA CREATION;  Surgeon: Mal [name], MD;  Location: [place];  Service: Vascular;  Laterality: Right;  . Tracheostomy tube placement N/A 08/20/2013     Procedure: TRACHEOSTOMY Revision;  Surgeon: Melida Quitter, MD;  Location: [place];  Service: ENT;  Laterality: N/A;  . Tee without cardioversion N/A 07/10/2015    Procedure: TRANSESOPHAGEAL ECHOCARDIOGRAM (TEE);  Surgeon: Josue Hector, MD;  Location[facility] ENDOSCOPY;  Service: Cardiovascular;  Laterality: N/A;   Family History  Problem Relation Age of Onset  . Diabetes Mother   . Diabetes Sister   . Diabetes Brother    Social History   Social History  . Marital Status: Single    Spouse Name: N/A  .  Number of Children: N/A  . Years of Education: N/A   Occupational History  . Not on file.   Social History Main Topics  . Smoking status: Never Smoker   . Smokeless tobacco: Never Used  . Alcohol Use: 0.6 oz/week    1 Cans of beer per week  . Drug Use: Yes    Special: Marijuana     Comment: ocassionaly   . Sexual Activity: Not Currently   Other Topics Concern  . 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  Divorce with 5 children, 3 boys and 2 girls.  Lives in [place].     Review of Systems: Negative Except per HPI  Physical Exam: Blood pressure 161/104, pulse 86, temperature 98.8 F (37.1 C), temperature source Rectal, resp. rate 22, height 5\' 8"  (1.727 m), weight 306 lb (138.801 kg), SpO2 97 %. General: Morbidly obese lying in bed, in no acute distress HEENT: Moist mucous membranes, no tonsillar exudate or erythema, no scleral icterus Cardiovascular: RRR, no m/r/g Pulmonary: Clear to auscultation bilaterally. Good respiratory effort. Back: Central tenderness to palpation on thoracic spine with paraspinal muscles. Severe back pain with any respositioning Abdomen: Soft, NTND. Normal bowel sounds. Large non-tender ventral, reducible hernia Skin: Warm and dry  Neurological: AAOx3, pupils equal and constricted  but reactive, EOMI. Face symmetric, tongue midline. Normal shoulder shrug. Negative Babinski sign. Hyporeflexic patellar reflexes. Sphincter tone and sensation in tact. Saddle region sensation in tact.Light touch sensation in tact throughout, proprioception in tact in lower extremities.   RUE: 5/5 RLE: 4+/5 LUE: 5/5 LLE: 5/5    Lab results: Basic Metabolic Panel:  Recent Labs  08/10/15 2217  NA 132*  K 3.4*  CL 88*  CO2 31  GLUCOSE 165*  BUN 19  CREATININE 6.73*  CALCIUM 8.9   Liver Function Tests:  Recent Labs  08/10/15 2217  AST 14*  ALT 10*  ALKPHOS 99  BILITOT 0.5  PROT 8.5*  ALBUMIN 3.8   CBC:  Recent Labs  08/10/15 2217  WBC 13.5*  NEUTROABS 10.7*  HGB 10.1*  HCT 31.3*  MCV 97.5  PLT 333    Recent Labs  08/10/15 1358  GLUCAP 184*   Imaging results:  Ct Thoracic Spine Wo Contrast  08/11/2015  CLINICAL DATA:  Initial evaluation for severe upper back pain with tingling in both legs, worse on the right. EXAM: CT THORACIC AND LUMBAR SPINE WITHOUT CONTRAST TECHNIQUE: Multidetector CT imaging of the thoracic and lumbar spine was performed without contrast. Multiplanar CT image reconstructions were also generated. COMPARISON:  None available. FINDINGS: CT THORACIC SPINE FINDINGS First rib-bearing vertebral body is labeled T1. There is abnormal osseous destruction with fragmentation of the T4 and T5 vertebral bodies (series 7, image 21). There is associated severe height loss with osseous fragmentation. There is associated retropulsion of osseous fragments into the spinal canal by at least 8 mm. Additionally, there is probable abnormal soft tissue density within the ventral epidural space at the level of T4 and T5. There is moderate to severe canal stenosis with probable cord compression. Cord appears displaced to the left. Extension into the posterior elements with erosive changes of the bilateral pedicles of T5, and left pedicle of T4. There is abnormal soft  tissue about the T4 and T5 vertebral bodies within the paraspinous soft tissue  as well. Erosive changes within the bilateral posteromedial fourth ribs. The T4-5 intervertebral disc space is largely obliterated. Findings most concerning for possible osteomyelitis discitis/infection. Underlying malignancy could also be considered. No other worrisome focal osseous lesions identified. Heterogeneous lucent lesion with sclerotic margins within the T9 vertebral body most consistent with a benign hemangioma. No other acute abnormality within the thoracic spine. No other significant degenerative changes appreciated. Paraspinous soft tissues otherwise within normal limits. CT LUMBAR SPINE FINDINGS Grade 1 anterolisthesis of L4 on L5. Transitional lumbosacral anatomy with partial sacralization of the L5 vertebral body. Vertebral bodies otherwise normally aligned. Vertebral body heights preserved. No fracture. Single 7 mm lucent lesion within the anterior aspect of L3, indeterminate, but of doubtful clinical significance. No other focal osseous lesions. Paraspinous soft tissues within normal limits. There is question of a a nonobstructive stone within the inferior pole the right kidney. No retroperitoneal adenopathy. L4-5: 4 mm anterolisthesis. No associated pars defect. Severe bilateral facet arthrosis. Diffuse disc bulge without definite focal disc herniation. Ligamentum flavum hypertrophy. Mild to moderate canal stenosis with probable at least moderate bilateral foraminal narrowing. L5-S1: Degenerative changes present at the anterior aspect of the L5 vertebral body. Mild disc bulge without significant stenosis. IMPRESSION: CT THORACIC SPINE IMPRESSION 1. Destructive process centered at the T4-5 level with associated osseous destruction and fragmentation of both the T4 and T5 vertebral bodies with focal kyphosis of the thoracic spine. Primary concern is for possible osteomyelitis discitis/infection. Dialysis induced  spondyloarthropathy could also be considered in this patient on dialysis. Possible malignancy could also conceivably have this appearance but is less favored given the lack of additional metastatic lesions. There is abnormal soft tissue density within the ventral epidural space at this level with secondary moderate to severe cord compression. Follow-up examination with dedicated MRI may be helpful for further evaluation. CT LUMBAR SPINE IMPRESSION: 1. No acute process within the lumbar spine. 2. Grade 1 anterolisthesis at L4-5 with associated disc bulge and facet arthrosis with moderate canal and bilateral foraminal stenosis. 3. Mild degenerative changes at L5-S1 without stenosis. 4. Transitional lumbosacral anatomy with partial sacralization of the L5 vertebral body. Careful correlation with numbering system on this study recommended prior to any potential surgical intervention. Critical Value/emergent results were called by telephone at the time of interpretation on 08/11/2015 at 1:59 am to Dr. Leonides Schanz , who verbally acknowledged these results. Electronically Signed   By: Jeannine Boga M.D.   On: 08/11/2015 02:12   Ct Lumbar Spine Wo Contrast  08/11/2015  CLINICAL DATA:  Initial evaluation for severe upper back pain with tingling in both legs, worse on the right. EXAM: CT THORACIC AND LUMBAR SPINE WITHOUT CONTRAST TECHNIQUE: Multidetector CT imaging of the thoracic and lumbar spine was performed without contrast. Multiplanar CT image reconstructions were also generated. COMPARISON:  None available. FINDINGS: CT THORACIC SPINE FINDINGS First rib-bearing vertebral body is labeled T1. There is abnormal osseous destruction with fragmentation of the T4 and T5 vertebral bodies (series 7, image 21). There is associated severe height loss with osseous fragmentation. There is associated retropulsion of osseous fragments into the spinal canal by at least 8 mm. Additionally, there is probable abnormal soft tissue  density within the ventral epidural space at the level of T4 and T5. There is moderate to severe canal stenosis with probable cord compression. Cord appears displaced to the left. Extension into the posterior elements with erosive changes of the bilateral pedicles of T5, and left pedicle of T4. There is abnormal soft  tissue about the T4 and T5 vertebral bodies within the paraspinous soft tissue as well. Erosive changes within the bilateral posteromedial fourth ribs. The T4-5 intervertebral disc space is largely obliterated. Findings most concerning for possible osteomyelitis discitis/infection. Underlying malignancy could also be considered. No other worrisome focal osseous lesions identified. Heterogeneous lucent lesion with sclerotic margins within the T9 vertebral body most consistent with a benign hemangioma. No other acute abnormality within the thoracic spine. No other significant degenerative changes appreciated. Paraspinous soft tissues otherwise within normal limits. CT LUMBAR SPINE FINDINGS Grade 1 anterolisthesis of L4 on L5. Transitional lumbosacral anatomy with partial sacralization of the L5 vertebral body. Vertebral bodies otherwise normally aligned. Vertebral body heights preserved. No fracture. Single 7 mm lucent lesion within the anterior aspect of L3, indeterminate, but of doubtful clinical significance. No other focal osseous lesions. Paraspinous soft tissues within normal limits. There is question of a a nonobstructive stone within the inferior pole the right kidney. No retroperitoneal adenopathy. L4-5: 4 mm anterolisthesis. No associated pars defect. Severe bilateral facet arthrosis. Diffuse disc bulge without definite focal disc herniation. Ligamentum flavum hypertrophy. Mild to moderate canal stenosis with probable at least moderate bilateral foraminal narrowing. L5-S1: Degenerative changes present at the anterior aspect of the L5 vertebral body. Mild disc bulge without significant stenosis.  IMPRESSION: CT THORACIC SPINE IMPRESSION 1. Destructive process centered at the T4-5 level with associated osseous destruction and fragmentation of both the T4 and T5 vertebral bodies with focal kyphosis of the thoracic spine. Primary concern is for possible osteomyelitis discitis/infection. Dialysis induced spondyloarthropathy could also be considered in this patient on dialysis. Possible malignancy could also conceivably have this appearance but is less favored given the lack of additional metastatic lesions. There is abnormal soft tissue density within the ventral epidural space at this level with secondary moderate to severe cord compression. Follow-up examination with dedicated MRI may be helpful for further evaluation. CT LUMBAR SPINE IMPRESSION: 1. No acute process within the lumbar spine. 2. Grade 1 anterolisthesis at L4-5 with associated disc bulge and facet arthrosis with moderate canal and bilateral foraminal stenosis. 3. Mild degenerative changes at L5-S1 without stenosis. 4. Transitional lumbosacral anatomy with partial sacralization of the L5 vertebral body. Careful correlation with numbering system on this study recommended prior to any potential surgical intervention. Critical Value/emergent results were called by telephone at the time of interpretation on 08/11/2015 at 1:59 am to Dr. Leonides Schanz , who verbally acknowledged these results. Electronically Signed   By: Jeannine Boga M.D.   On: 08/11/2015 02:12    Assessment & Plan by Problem:  T4-T5 Degenerative disc disease with possible osteomyelitis and cord compression: At this time, Mr. Kilmon does not express any new alarming neurologic signs or symptoms that would warrant emergent surgery. Nonetheless, the bony destruction of his vertebrate is concerning. Given his previous Staph lugdunesis bacteremia with out a known source - either osteomyelitis of the vertebrae could have been the source or it could have been seeded. Malignancy should also  been considered, but in the absence of other metastases or constitutional symptoms, this is lower on the differential. A leukocytosis is suggestive of infection, but is relative nonspecific in this scenario. Regardless, a thoracic MRI is required for further workup, and hitherto, Mr. Awad has been aversive due to claustrophobia. We have counseled him on the importance of obtaining this MRI and offered anxiolytics. He told us he'd "think about it." - Thoracic MRI ordered with IV valium 10 mg if needed - Dilaudid 1  mg q4h prn severe pain - Gabapentin 300 mg TID - Blood cultures pending - Holding antibiotics per neurosurgery commendations - Will appreciate neurosurgery recommendations with respect to workup and possible operative management  HTN:  161/104 and did not take amlodipine today. - Amlodipine 10 mg daily - ASA 81 mg  T2DM: Only take sliding scale insulin at home - SSI  ESRD: MWF dialysis at [facility]. Will restart tomorrow.  DVT Prophylaxis: Heparin Code Status: Full  Dispo: Disposition is deferred at this time, awaiting improvement of current medical problems. Anticipated discharge in approximately 2-3 day(s).   The patient does have a current PCP (Zada Finders, MD) and does need an [facility] hospital follow-up appointment after discharge.  The patient does have transportation limitations that hinder transportation to clinic appointments.  Signed: Liberty Handy, MD 08/11/2015, 4:29 AM

## 2015-08-12 ENCOUNTER — Inpatient Hospital Stay (HOSPITAL_COMMUNITY): Payer: Medicare Other

## 2015-08-12 ENCOUNTER — Observation Stay (HOSPITAL_COMMUNITY): Payer: Medicare Other | Admitting: Anesthesiology

## 2015-08-12 ENCOUNTER — Encounter (HOSPITAL_COMMUNITY)
Admission: EM | Disposition: A | Discharge status: Rehabilitation Facility | Payer: Self-pay | Source: Home / Self Care | Attending: Internal Medicine

## 2015-08-12 DIAGNOSIS — G959 Disease of spinal cord, unspecified: Secondary | ICD-10-CM | POA: Diagnosis not present

## 2015-08-12 DIAGNOSIS — M5134 Other intervertebral disc degeneration, thoracic region: Secondary | ICD-10-CM

## 2015-08-12 DIAGNOSIS — M1711 Unilateral primary osteoarthritis, right knee: Secondary | ICD-10-CM | POA: Diagnosis not present

## 2015-08-12 DIAGNOSIS — Z794 Long term (current) use of insulin: Secondary | ICD-10-CM | POA: Diagnosis not present

## 2015-08-12 DIAGNOSIS — M8448XA Pathological fracture, other site, initial encounter for fracture: Secondary | ICD-10-CM | POA: Diagnosis present

## 2015-08-12 DIAGNOSIS — Z992 Dependence on renal dialysis: Secondary | ICD-10-CM | POA: Diagnosis not present

## 2015-08-12 DIAGNOSIS — M4804 Spinal stenosis, thoracic region: Secondary | ICD-10-CM | POA: Diagnosis present

## 2015-08-12 DIAGNOSIS — M869 Osteomyelitis, unspecified: Secondary | ICD-10-CM | POA: Diagnosis not present

## 2015-08-12 DIAGNOSIS — N2581 Secondary hyperparathyroidism of renal origin: Secondary | ICD-10-CM | POA: Diagnosis not present

## 2015-08-12 DIAGNOSIS — Z8619 Personal history of other infectious and parasitic diseases: Secondary | ICD-10-CM

## 2015-08-12 DIAGNOSIS — N186 End stage renal disease: Secondary | ICD-10-CM | POA: Diagnosis not present

## 2015-08-12 DIAGNOSIS — G9340 Encephalopathy, unspecified: Secondary | ICD-10-CM | POA: Diagnosis present

## 2015-08-12 DIAGNOSIS — Z6841 Body Mass Index (BMI) 40.0 and over, adult: Secondary | ICD-10-CM | POA: Diagnosis not present

## 2015-08-12 DIAGNOSIS — D72829 Elevated white blood cell count, unspecified: Secondary | ICD-10-CM | POA: Diagnosis not present

## 2015-08-12 DIAGNOSIS — G061 Intraspinal abscess and granuloma: Secondary | ICD-10-CM | POA: Diagnosis present

## 2015-08-12 DIAGNOSIS — R700 Elevated erythrocyte sedimentation rate: Secondary | ICD-10-CM

## 2015-08-12 DIAGNOSIS — I120 Hypertensive chronic kidney disease with stage 5 chronic kidney disease or end stage renal disease: Secondary | ICD-10-CM | POA: Diagnosis not present

## 2015-08-12 DIAGNOSIS — E118 Type 2 diabetes mellitus with unspecified complications: Secondary | ICD-10-CM | POA: Diagnosis not present

## 2015-08-12 DIAGNOSIS — M549 Dorsalgia, unspecified: Secondary | ICD-10-CM | POA: Diagnosis not present

## 2015-08-12 DIAGNOSIS — D638 Anemia in other chronic diseases classified elsewhere: Secondary | ICD-10-CM | POA: Diagnosis present

## 2015-08-12 DIAGNOSIS — R7881 Bacteremia: Secondary | ICD-10-CM | POA: Diagnosis not present

## 2015-08-12 DIAGNOSIS — R509 Fever, unspecified: Secondary | ICD-10-CM | POA: Diagnosis not present

## 2015-08-12 DIAGNOSIS — N250 Renal osteodystrophy: Secondary | ICD-10-CM | POA: Diagnosis present

## 2015-08-12 DIAGNOSIS — M4624 Osteomyelitis of vertebra, thoracic region: Secondary | ICD-10-CM | POA: Diagnosis not present

## 2015-08-12 DIAGNOSIS — J9601 Acute respiratory failure with hypoxia: Secondary | ICD-10-CM | POA: Diagnosis not present

## 2015-08-12 DIAGNOSIS — D62 Acute posthemorrhagic anemia: Secondary | ICD-10-CM | POA: Diagnosis not present

## 2015-08-12 DIAGNOSIS — J9690 Respiratory failure, unspecified, unspecified whether with hypoxia or hypercapnia: Secondary | ICD-10-CM | POA: Diagnosis not present

## 2015-08-12 DIAGNOSIS — R6521 Severe sepsis with septic shock: Secondary | ICD-10-CM | POA: Diagnosis not present

## 2015-08-12 DIAGNOSIS — A419 Sepsis, unspecified organism: Secondary | ICD-10-CM | POA: Diagnosis not present

## 2015-08-12 DIAGNOSIS — M4644 Discitis, unspecified, thoracic region: Secondary | ICD-10-CM | POA: Diagnosis not present

## 2015-08-12 DIAGNOSIS — I10 Essential (primary) hypertension: Secondary | ICD-10-CM | POA: Diagnosis not present

## 2015-08-12 DIAGNOSIS — E662 Morbid (severe) obesity with alveolar hypoventilation: Secondary | ICD-10-CM | POA: Diagnosis present

## 2015-08-12 DIAGNOSIS — G9520 Unspecified cord compression: Secondary | ICD-10-CM | POA: Diagnosis not present

## 2015-08-12 DIAGNOSIS — I959 Hypotension, unspecified: Secondary | ICD-10-CM | POA: Diagnosis present

## 2015-08-12 DIAGNOSIS — R7982 Elevated C-reactive protein (CRP): Secondary | ICD-10-CM

## 2015-08-12 DIAGNOSIS — M40209 Unspecified kyphosis, site unspecified: Secondary | ICD-10-CM | POA: Diagnosis present

## 2015-08-12 DIAGNOSIS — D631 Anemia in chronic kidney disease: Secondary | ICD-10-CM | POA: Diagnosis not present

## 2015-08-12 DIAGNOSIS — E1122 Type 2 diabetes mellitus with diabetic chronic kidney disease: Secondary | ICD-10-CM | POA: Diagnosis not present

## 2015-08-12 DIAGNOSIS — M545 Low back pain: Secondary | ICD-10-CM | POA: Diagnosis not present

## 2015-08-12 DIAGNOSIS — G062 Extradural and subdural abscess, unspecified: Secondary | ICD-10-CM | POA: Diagnosis not present

## 2015-08-12 DIAGNOSIS — Z9889 Other specified postprocedural states: Secondary | ICD-10-CM | POA: Diagnosis not present

## 2015-08-12 DIAGNOSIS — Z7982 Long term (current) use of aspirin: Secondary | ICD-10-CM | POA: Diagnosis not present

## 2015-08-12 LAB — CBC WITH DIFFERENTIAL/PLATELET
Basophils Absolute: 0 10*3/uL (ref 0.0–0.1)
Basophils Relative: 0 %
Eosinophils Absolute: 0.3 10*3/uL (ref 0.0–0.7)
Eosinophils Relative: 3 %
HCT: 30 % — ABNORMAL LOW (ref 39.0–52.0)
HEMOGLOBIN: 9.6 g/dL — AB (ref 13.0–17.0)
LYMPHS ABS: 1.8 10*3/uL (ref 0.7–4.0)
LYMPHS PCT: 18 %
MCH: 31.8 pg (ref 26.0–34.0)
MCHC: 32 g/dL (ref 30.0–36.0)
MCV: 99.3 fL (ref 78.0–100.0)
MONOS PCT: 5 %
Monocytes Absolute: 0.5 10*3/uL (ref 0.1–1.0)
NEUTROS PCT: 74 %
Neutro Abs: 7.3 10*3/uL (ref 1.7–7.7)
Platelets: 339 10*3/uL (ref 150–400)
RBC: 3.02 MIL/uL — AB (ref 4.22–5.81)
RDW: 15.6 % — ABNORMAL HIGH (ref 11.5–15.5)
WBC: 10 10*3/uL (ref 4.0–10.5)

## 2015-08-12 LAB — RENAL FUNCTION PANEL
ANION GAP: 15 (ref 5–15)
Albumin: 3.1 g/dL — ABNORMAL LOW (ref 3.5–5.0)
BUN: 35 mg/dL — ABNORMAL HIGH (ref 6–20)
CHLORIDE: 86 mmol/L — AB (ref 101–111)
CO2: 30 mmol/L (ref 22–32)
Calcium: 8.5 mg/dL — ABNORMAL LOW (ref 8.9–10.3)
Creatinine, Ser: 9.72 mg/dL — ABNORMAL HIGH (ref 0.61–1.24)
GFR calc non Af Amer: 5 mL/min — ABNORMAL LOW (ref >60)
GFR, EST AFRICAN AMERICAN: 6 mL/min — AB (ref >60)
Glucose, Bld: 219 mg/dL — ABNORMAL HIGH (ref 65–99)
POTASSIUM: 4 mmol/L (ref 3.5–5.1)
Phosphorus: 7 mg/dL — ABNORMAL HIGH (ref 2.5–4.6)
Sodium: 131 mmol/L — ABNORMAL LOW (ref 135–145)

## 2015-08-12 LAB — GLUCOSE, CAPILLARY
Glucose-Capillary: 153 mg/dL — ABNORMAL HIGH (ref 65–99)
Glucose-Capillary: 140 mg/dL — ABNORMAL HIGH (ref 65–99)

## 2015-08-12 LAB — SEDIMENTATION RATE: SED RATE: 128 mm/h — AB (ref 0–16)

## 2015-08-12 LAB — HEPATITIS B SURFACE ANTIGEN: Hepatitis B Surface Ag: NEGATIVE

## 2015-08-12 SURGERY — POSTERIOR LUMBAR FUSION 2 LEVEL
Anesthesia: General | Site: Back

## 2015-08-12 MED ORDER — FENTANYL CITRATE (PF) 250 MCG/5ML IJ SOLN
INTRAMUSCULAR | Status: AC
  Filled 2015-08-12: qty 5

## 2015-08-12 MED ORDER — GLYCOPYRROLATE 0.2 MG/ML IJ SOLN
INTRAMUSCULAR | Status: DC | PRN
  Administered 2015-08-12: 0.4 mg via INTRAVENOUS

## 2015-08-12 MED ORDER — SODIUM CHLORIDE 0.9 % IV SOLN: 100.0000 mL | INTRAVENOUS | Status: DC | PRN

## 2015-08-12 MED ORDER — VANCOMYCIN HCL IN DEXTROSE 1-5 GM/200ML-% IV SOLN
INTRAVENOUS | Status: AC
  Administered 2015-08-12: 1000 mg via INTRAVENOUS
  Filled 2015-08-12: qty 200

## 2015-08-12 MED ORDER — VANCOMYCIN HCL 1000 MG IV SOLR
INTRAVENOUS | Status: AC
  Filled 2015-08-12: qty 1000

## 2015-08-12 MED ORDER — FENTANYL CITRATE (PF) 100 MCG/2ML IJ SOLN
INTRAMUSCULAR | Status: DC | PRN
  Administered 2015-08-12: 100 ug via INTRAVENOUS
  Administered 2015-08-12 (×3): 50 ug via INTRAVENOUS

## 2015-08-12 MED ORDER — MIDAZOLAM HCL 2 MG/2ML IJ SOLN
INTRAMUSCULAR | Status: AC
  Filled 2015-08-12: qty 4

## 2015-08-12 MED ORDER — NEOSTIGMINE METHYLSULFATE 10 MG/10ML IV SOLN
INTRAVENOUS | Status: DC | PRN
  Administered 2015-08-12: 3 mg via INTRAVENOUS

## 2015-08-12 MED ORDER — LIDOCAINE HCL (CARDIAC) 20 MG/ML IV SOLN
INTRAVENOUS | Status: DC | PRN
  Administered 2015-08-12: 80 mg via INTRAVENOUS

## 2015-08-12 MED ORDER — DEXTROSE 5 % IV SOLN
2.0000 g | INTRAVENOUS | Status: AC
  Administered 2015-08-12: 2 g via INTRAVENOUS
  Filled 2015-08-12: qty 2

## 2015-08-12 MED ORDER — SUCCINYLCHOLINE CHLORIDE 20 MG/ML IJ SOLN
INTRAMUSCULAR | Status: DC | PRN
  Administered 2015-08-12: 100 mg via INTRAVENOUS
  Administered 2015-08-12: 120 mg via INTRAVENOUS

## 2015-08-12 MED ORDER — HEPARIN SODIUM (PORCINE) 1000 UNIT/ML DIALYSIS: 1000.0000 [IU] | INTRAMUSCULAR | Status: DC | PRN

## 2015-08-12 MED ORDER — EPHEDRINE SULFATE 50 MG/ML IJ SOLN
INTRAMUSCULAR | Status: DC | PRN
  Administered 2015-08-12 (×3): 5 mg via INTRAVENOUS

## 2015-08-12 MED ORDER — LIDOCAINE-PRILOCAINE 2.5-2.5 % EX CREA: 1.0000 "application " | TOPICAL_CREAM | CUTANEOUS | Status: DC | PRN

## 2015-08-12 MED ORDER — ALTEPLASE 2 MG IJ SOLR
2.0000 mg | Freq: Once | INTRAMUSCULAR | Status: DC | PRN
Start: 2015-08-12 — End: 2015-08-12

## 2015-08-12 MED ORDER — SODIUM CHLORIDE 0.9 % IV SOLN
INTRAVENOUS | Status: DC | PRN
  Administered 2015-08-12 (×2): via INTRAVENOUS

## 2015-08-12 MED ORDER — LIDOCAINE HCL (PF) 1 % IJ SOLN: 5.0000 mL | INTRAMUSCULAR | Status: DC | PRN

## 2015-08-12 MED ORDER — VITAMIN D 1000 UNITS PO TABS
1000.0000 [IU] | ORAL_TABLET | Freq: Every day | ORAL | Status: DC
  Administered 2015-08-12 – 2015-08-16 (×4): 1000 [IU] via ORAL
  Filled 2015-08-12 (×4): qty 1

## 2015-08-12 MED ORDER — ALBUTEROL SULFATE HFA 108 (90 BASE) MCG/ACT IN AERS
INHALATION_SPRAY | RESPIRATORY_TRACT | Status: DC | PRN
  Administered 2015-08-12: 2 via RESPIRATORY_TRACT

## 2015-08-12 MED ORDER — MIDAZOLAM HCL 5 MG/5ML IJ SOLN
INTRAMUSCULAR | Status: DC | PRN
  Administered 2015-08-12: 2 mg via INTRAVENOUS

## 2015-08-12 MED ORDER — ONDANSETRON HCL 4 MG/2ML IJ SOLN
INTRAMUSCULAR | Status: DC | PRN
  Administered 2015-08-12: 4 mg via INTRAVENOUS

## 2015-08-12 MED ORDER — THROMBIN 20000 UNITS EX SOLR
CUTANEOUS | Status: DC | PRN
  Administered 2015-08-12: 20 mL via TOPICAL

## 2015-08-12 MED ORDER — PHENYLEPHRINE HCL 10 MG/ML IJ SOLN
10.0000 mg | INTRAVENOUS | Status: DC | PRN
  Administered 2015-08-12: 30 ug/min via INTRAVENOUS

## 2015-08-12 MED ORDER — VANCOMYCIN HCL 1000 MG IV SOLR
INTRAVENOUS | Status: DC | PRN
  Administered 2015-08-12: 1000 mg

## 2015-08-12 MED ORDER — HEPARIN SODIUM (PORCINE) 1000 UNIT/ML DIALYSIS: 20.0000 [IU]/kg | INTRAMUSCULAR | Status: DC | PRN

## 2015-08-12 MED ORDER — PHENYLEPHRINE HCL 10 MG/ML IJ SOLN
INTRAMUSCULAR | Status: DC | PRN
  Administered 2015-08-12: 180 ug via INTRAVENOUS
  Administered 2015-08-12: 12 ug via INTRAVENOUS

## 2015-08-12 MED ORDER — ROCURONIUM BROMIDE 100 MG/10ML IV SOLN
INTRAVENOUS | Status: DC | PRN
  Administered 2015-08-12: 30 mg via INTRAVENOUS
  Administered 2015-08-12: 50 mg via INTRAVENOUS
  Administered 2015-08-12: 10 mg via INTRAVENOUS

## 2015-08-12 MED ORDER — SODIUM CHLORIDE 0.9 % IR SOLN
Status: DC | PRN
  Administered 2015-08-12: 500 mL

## 2015-08-12 MED ORDER — 0.9 % SODIUM CHLORIDE (POUR BTL) OPTIME
TOPICAL | Status: DC | PRN
  Administered 2015-08-12: 1000 mL

## 2015-08-12 MED ORDER — PROPOFOL 10 MG/ML IV BOLUS
INTRAVENOUS | Status: DC | PRN
  Administered 2015-08-12: 50 mg via INTRAVENOUS
  Administered 2015-08-12: 140 mg via INTRAVENOUS
  Administered 2015-08-12: 200 mg via INTRAVENOUS

## 2015-08-12 MED ORDER — CYCLOBENZAPRINE HCL 10 MG PO TABS: 10.0000 mg | ORAL_TABLET | Freq: Three times a day (TID) | ORAL | Status: DC | PRN

## 2015-08-12 MED ORDER — PENTAFLUOROPROP-TETRAFLUOROETH EX AERO: 1.0000 "application " | INHALATION_SPRAY | CUTANEOUS | Status: DC | PRN

## 2015-08-12 SURGICAL SUPPLY — 67 items
BAG DECANTER FOR FLEXI CONT (MISCELLANEOUS) ×3 IMPLANT
BENZOIN TINCTURE PRP APPL 2/3 (GAUZE/BANDAGES/DRESSINGS) ×3 IMPLANT
BLADE CLIPPER SURG (BLADE) IMPLANT
BRUSH SCRUB EZ PLAIN DRY (MISCELLANEOUS) ×3 IMPLANT
BUR CUTTER 7.0 ROUND (BURR) ×3 IMPLANT
BUR MATCHSTICK NEURO 3.0 LAGG (BURR) ×3 IMPLANT
CAGE CROSSLINK SOLERA (Cage) ×2 IMPLANT
CANISTER SUCT 3000ML PPV (MISCELLANEOUS) ×3 IMPLANT
CLOSURE WOUND 1/2 X4 (GAUZE/BANDAGES/DRESSINGS) ×2
CONT SPEC 4OZ CLIKSEAL STRL BL (MISCELLANEOUS) ×3 IMPLANT
COVER BACK TABLE 60X90IN (DRAPES) ×3 IMPLANT
DECANTER SPIKE VIAL GLASS SM (MISCELLANEOUS) ×3 IMPLANT
DRAPE C-ARM 42X72 X-RAY (DRAPES) ×9 IMPLANT
DRAPE C-ARMOR (DRAPES) ×3 IMPLANT
DRAPE LAPAROTOMY 100X72X124 (DRAPES) ×3 IMPLANT
DRAPE POUCH INSTRU U-SHP 10X18 (DRAPES) ×3 IMPLANT
DRAPE PROXIMA HALF (DRAPES) IMPLANT
DRAPE SURG 17X23 STRL (DRAPES) ×12 IMPLANT
DRSG OPSITE POSTOP 4X10 (GAUZE/BANDAGES/DRESSINGS) ×3 IMPLANT
DURAPREP 26ML APPLICATOR (WOUND CARE) ×3 IMPLANT
ELECT REM PT RETURN 9FT ADLT (ELECTROSURGICAL) ×3
ELECTRODE REM PT RTRN 9FT ADLT (ELECTROSURGICAL) ×1 IMPLANT
EVACUATOR 1/8 PVC DRAIN (DRAIN) ×3 IMPLANT
GAUZE SPONGE 4X4 12PLY STRL (GAUZE/BANDAGES/DRESSINGS) ×3 IMPLANT
GAUZE SPONGE 4X4 16PLY XRAY LF (GAUZE/BANDAGES/DRESSINGS) IMPLANT
GLOVE BIO SURGEON STRL SZ 6.5 (GLOVE) ×2 IMPLANT
GLOVE BIO SURGEON STRL SZ7 (GLOVE) ×3 IMPLANT
GLOVE BIO SURGEONS STRL SZ 6.5 (GLOVE) ×1
GLOVE ECLIPSE 6.5 STRL STRAW (GLOVE) ×3 IMPLANT
GLOVE ECLIPSE 9.0 STRL (GLOVE) ×9 IMPLANT
GLOVE EXAM NITRILE LRG STRL (GLOVE) IMPLANT
GLOVE EXAM NITRILE MD LF STRL (GLOVE) IMPLANT
GLOVE EXAM NITRILE XL STR (GLOVE) IMPLANT
GLOVE EXAM NITRILE XS STR PU (GLOVE) IMPLANT
GLOVE INDICATOR 6.5 STRL GRN (GLOVE) ×6 IMPLANT
GOWN STRL REUS W/ TWL LRG LVL3 (GOWN DISPOSABLE) ×2 IMPLANT
GOWN STRL REUS W/ TWL XL LVL3 (GOWN DISPOSABLE) ×4 IMPLANT
GOWN STRL REUS W/TWL 2XL LVL3 (GOWN DISPOSABLE) IMPLANT
GOWN STRL REUS W/TWL LRG LVL3 (GOWN DISPOSABLE) ×4
GOWN STRL REUS W/TWL XL LVL3 (GOWN DISPOSABLE) ×8
KIT BASIN OR (CUSTOM PROCEDURE TRAY) ×3 IMPLANT
KIT ROOM TURNOVER OR (KITS) ×3 IMPLANT
LIQUID BAND (GAUZE/BANDAGES/DRESSINGS) ×3 IMPLANT
MASTERGRAFT MATX STRIP 36X2X.6 ×3 IMPLANT
MATRIX MASTERGRAFT STR 36X2X.6 ×1 IMPLANT
NEEDLE HYPO 22GX1.5 SAFETY (NEEDLE) ×3 IMPLANT
NS IRRIG 1000ML POUR BTL (IV SOLUTION) ×3 IMPLANT
PACK LAMINECTOMY NEURO (CUSTOM PROCEDURE TRAY) ×3 IMPLANT
ROD SOLERA 4.75 (Rod) ×3 IMPLANT
SCREW MAS 4.5X35 (Screw) ×6 IMPLANT
SCREW MAS 4.5X40 (Screw) ×12 IMPLANT
SCREW MAS 5.5X40 (Screw) ×12 IMPLANT
SCREW SET SOLERA (Screw) ×20 IMPLANT
SCREW SET SOLERA TI (Screw) ×10 IMPLANT
SPONGE SURGIFOAM ABS GEL 100 (HEMOSTASIS) ×3 IMPLANT
STRIP CLOSURE SKIN 1/2X4 (GAUZE/BANDAGES/DRESSINGS) ×4 IMPLANT
STRIP SURGICAL 1/2 X 6 IN (GAUZE/BANDAGES/DRESSINGS) ×3 IMPLANT
SUT VIC AB 0 CT1 18XCR BRD8 (SUTURE) ×4 IMPLANT
SUT VIC AB 0 CT1 8-18 (SUTURE) ×8
SUT VIC AB 2-0 CT1 18 (SUTURE) ×6 IMPLANT
SUT VIC AB 3-0 SH 8-18 (SUTURE) ×6 IMPLANT
TOWEL OR 17X24 6PK STRL BLUE (TOWEL DISPOSABLE) ×3 IMPLANT
TOWEL OR 17X26 10 PK STRL BLUE (TOWEL DISPOSABLE) ×3 IMPLANT
TRAY FOLEY W/METER SILVER 14FR (SET/KITS/TRAYS/PACK) ×3 IMPLANT
TUBE CONNECTING 12'X1/4 (SUCTIONS) ×2
TUBE CONNECTING 12X1/4 (SUCTIONS) ×4 IMPLANT
WATER STERILE IRR 1000ML POUR (IV SOLUTION) ×3 IMPLANT

## 2015-08-12 NOTE — Procedures (Signed)
I was present at this dialysis session. I have reviewed the session itself and made appropriate changes.   [name]  MD 08/12/2015, 8:53 AM

## 2015-08-12 NOTE — Consult Note (Signed)
[facility] for Infectious Disease       Reason for Consult: possible dicitis    Referring Physician: Dr. Dareen Piano  Active Problems:   Cord compression ([place])   Back pain   . amLODipine  10 mg Oral Daily  . aspirin EC  81 mg Oral Daily  . calcium acetate  1,334 mg Oral TID WC  . cholecalciferol  1,000 Units Oral Daily  . gabapentin  300 mg Oral TID  . heparin  5,000 Units Subcutaneous 3 times per day  . insulin aspart  0-20 Units Subcutaneous TID WC    Recommendations: Hold antibiotics pending surgery today Start cefazolin after surgery while waiting for cultures (if OR report suggests infection)   Assessment: He has probable discitis based on elevated ESR/CRP, MRI findings and recent bacteremia with S lugdunensis.  Surgery today.    Antibiotics: None; did get treated for 2 weeks with cefazolin.   HPI: Seth [name] is a 49 y.o. male with ESRD on dialysis, DM, DDD and recent hospitalization for Staph lugdunensis s/p 2 weeks of cefazolin after negative TEE who presented with 2-3 days of weakness in legs, tingling and MRI with destructive endplate changes at V6-7.  Some back pain for a month.  No fever, no chills. Had Staph lugdunensis in recent hospitalization.  Signficant pain. Has been seen by neurosurgery and plan for surgical debridement today.   MRI independently reviewed, no epidural abscess.  PRevious hospitalization reviewed, negative TEE, repeat blood cultures were negative.   Review of Systems:  Constitutional: negative for fevers, chills and fatigue Gastrointestinal: negative for diarrhea All other systems reviewed and are negative   Past Medical History  Diagnosis Date  . Hypertension   . ESRD (end stage renal disease) on dialysis ([place])   . 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 Sleep apnea     USES CPAP  . Diabetes mellitus     INSULIN DEPENDENT DIABETES  . Discitis 07/2015    Social History   Substance Use Topics  . Smoking status: Never Smoker   . Smokeless tobacco: Never Used  . Alcohol Use: 0.6 oz/week    1 Cans of beer per week    Family History  Problem Relation Age of Onset  . Diabetes Mother   . Diabetes Sister   . Diabetes Brother     No Known Allergies  Physical Exam: Constitutional: in no apparent distress and alert  Filed Vitals:   08/12/15 1130  BP: 139/75  Pulse: 96  Temp: 98.4 F (36.9 C)  Resp: 20   EYES: anicteric ENMT: Cardiovascular: Cor RRR and No murmurs Respiratory: CTA B; normal respiratory effort GI: Bowel sounds are normal, soft Musculoskeletal: peripheral pulses normal, no pedal edema, no clubbing or cyanosis Skin: negatives: no rash Hematologic: no cervical lad  Lab Results  Component Value Date   WBC 10.0 08/12/2015   HGB 9.6* 08/12/2015   HCT 30.0* 08/12/2015   MCV 99.3 08/12/2015   PLT 339 08/12/2015    Lab Results  Component Value Date   CREATININE 9.72* 08/12/2015   BUN 35* 08/12/2015   NA 131* 08/12/2015   K 4.0 08/12/2015   CL 86* 08/12/2015   CO2 30 08/12/2015    Lab Results  Component Value Date   ALT 10* 08/10/2015   AST 14* 08/10/2015   ALKPHOS 99 08/10/2015     Microbiology: Recent Results (from the past  240 hour(s))  Blood culture (routine x 2)     Status: None (Preliminary result)   Collection Time: 08/11/15  2:45 AM  Result Value Ref Range Status   Specimen Description BLOOD LEFT ARM  Final   Special Requests BOTTLES DRAWN AEROBIC AND ANAEROBIC 5CC  Final   Culture NO GROWTH 1 DAY  Final   Report Status PENDING  Incomplete  Blood culture (routine x 2)     Status: None (Preliminary result)   Collection Time: 08/11/15  8:27 AM  Result Value Ref Range Status   Specimen Description BLOOD LEFT HAND  Final   Special Requests BOTTLES DRAWN AEROBIC ONLY  5CC  Final   Culture NO GROWTH 1 DAY  Final   Report Status PENDING  Incomplete    Scharlene Gloss, [facility] Infect[facility]  D[facility] [email or web address] [phone] pager  [phone] cell 08/12/2015, 3:52 PM

## 2015-08-12 NOTE — Progress Notes (Signed)
   Subjective: Patient in HD session. Says he is doing fine, pain fairly controlled with medication. He is agreeable to surgery this evening. He denies any fevers, chills, bowel/bladder incontinence, or significant changes overnight. Objective: Vital signs in last 24 hours: Filed Vitals:   08/12/15 0930 08/12/15 1000 08/12/15 1030 08/12/15 1100  BP: 124/73 133/69 119/66 128/77  Pulse: 93 93 94 94  Temp:      TempSrc:      Resp:  20 18   Height:      Weight:      SpO2:       Weight change: -0 oz (-0.001 kg)  Intake/Output Summary (Last 24 hours) at 08/12/15 1141 Last data filed at 08/12/15 0601  Gross per 24 hour  Intake    220 ml  Output      0 ml  Net    220 ml   General: resting in bed, in dialysis Cardiac: RRR, no rubs, murmurs or gallops Pulm: clear to auscultation anteriorly Abd: soft, nontender, BS present Ext: warm and well perfused   Assessment/Plan: Active Problems:   Cord compression ([name])   Back pain  T4-T5 bony destruction: 49 year old male with PMH of ESRD on HD (MWF) and recent Staph lugdunesis bacteremia (of unknown origin, TTE/TEE neg) with progressive back pain found to have T4-T5 bony destruction per CT with moderate to severe canal stenosis. 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 No evidence of epidural abscess. Plan for surgery this evening. Patient will need IV antibiotic treatment for at least 6 weeks. I expect his osteomyelitis is from the Staph lugdunensis which can cause thoracic and lumbar osteomyelitis.  -Appreciate Neurosurgery following and rec's -blood cultures pending -Hold antibiotics for now, can start Cefazolin (per pharm during HD sessions) after surgery and after cultures are sent  -Pain control with Percocet 5-325 mg 1-2 tablets po q4h as  needed  ESRD on HD: MWF dialysis -Dialysis today -Appreciate Nephrology following  HTN: Amlodipine 10 mg daily  T2DM: SSI  Dispo: Disposition is deferred at this time, awaiting improvement of current medical problems.    The patient does have a current PCP (Zada Finders, MD) and does need an [facility] hospital follow-up appointment after discharge.    LOS: 1 day   Zada Finders, MD 08/12/2015, 11:41 AM

## 2015-08-12 NOTE — Op Note (Signed)
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 Microdissection  T1-T7 posterior lateral arthrodesis utilizing segmental pedicle screw fixation and morselized allograft  Surgeon:Hakeen Shipes A.Queena Monrreal, M.D.  Asst. Surgeon: Ronnald Ramp  Anesthesia: General  Indication: 49 year old morbidly obese male with end-stage renal disease presents with worsening back pain. Workup demonstrates evidence of pathologic fractures of T4 and T5 with collapse and kyphosis with significant retropulsion and severe spinal cord compression. Elements consistent with osteomyelitis discitis although given history renal osteodystrophy also a concern. Patient presents now for urgent decompression and fusion in hopes of improving his symptoms.  Operative note: After induction of anesthesia, in prone onto bolsters and appropriately padded. Thoracic region prepped and draped sterilely. Imaging with fluoroscopy was extremely limited secondary to patient's extreme body habitus. Incision made from lower cervical region to mid thoracic region. Subperiosteal dissection performed bilaterally exposing lamina facet joints and transverse processes of C7-T7. Retractor placed. Fluoroscopy used to the best of my ability to localize levels but again this was unsatisfactory. Using surface landmarks I ascertained the vertebral levels and performed a laminectomy at T4 and T5 area did the pedicle of T4 on the left side was eroded severely and the pedicle of T5 on the left side was moderately eroded. Both pedicles were resected. I entered the vertebral body laterally. I removed inflammatory tissue and fractured bone. Specimens were sent to pathology.  Cultures were obtained. There was a significant phlegmon around the thecal sac consistent with a smoldering infection. I did not find frank pus under any pressure however. I performed corpectomies at T4 and T5 through the lateral approach on the left side. This appeared to fully decompress the ventral surface of the spinal cord. I examined the patient's bone quality and examined options for anterior reconstruction. I do not feel that I could get an adequate cage and anteriorly and I thought given the presence of infection and his poor bone quality that methylmethacrylate reconstruction would be a poor option as well. With that in mind I decided to not place any anterior column support relying on significant buttressing of the thoracic cage and the posterior instrumentation. Pedicle entry sites at T1-T2 T3 T6 and T7 were identified bilaterally. These were confirmed to the best of our ability with fluoroscopic guidance and intraoperative landmarks. Pedicles were probed using a pedicle awl each pedicle awl track was probed and found to be solidly within the bone. Each pedicle awl track was then tapped with a screw tap. Medtronic solaris system was used using 4.5 mm screws at T1-T2 and T3 and 5.5 mm screws at T6 and T7. A segment of titanium rod was cut and contoured for use bilaterally. This was placed over the screw heads bilaterally. Locking caps were then placed over the screws. Locking caps were then engaged with the construct under mild compression. A transverse connector was placed. Bone was decorticated using high-speed drill. Master graft bone graft substitute was packed posterior and posterior laterally for hopeful later fusion. A medium Hemovac drain was left in the epidural space. Vancomycin powder was left in the deep wound space. Wound is then irrigated one final time and closed in layers using Vicryl sutures. Steri-Strips and sterile dressing were applied. There were no apparent complications. The patient  tolerated the procedure well  and he returns to the recovery room postop.

## 2015-08-12 NOTE — Anesthesia Procedure Notes (Addendum)
Anesthesia Procedure Note A Line Arrow Pen Left Radial Arterial Line Placement: Chlorhexidine prep,# 22 Ga. Arrow TW introducer needle inserted into the patient's Left radial artery, wire advanced, and # 20 Ga. 4.45 cm. Arrow catheter advanced over the wire and into the radial artery without difficulty.  The needle and wire were withdrawn and there was good blood flow. The catheter was connected via previously flushed high Durometer tubing to the transducer.  Blood was aspirated from the artery and tubing via the stopcock at the transducer.  The line and catheter was then flushed with NS.  Arterial catheter secured with a StatLock stabilization device.  2.5 cm BioPatch disc and sterile Tegaderm dressing applied.  No complications.  Anesthesia Procedure Note IJ Central Line Placement:  Trendelenburg position, head turned, chlorhexidine prep and drape.  #18 (2.5 inch) IV catheter inserted into L IJ vein, seeker needle,  IV stylet removed and blood easily aspirated from catheter.  Negative transduction.  0.89 mm wire inserted through IV catheter and into the IJ vein and IV catheter removed.  Incision made with # 11 scalpel along wire and through skin and subcutaneous tissue.  #2-lumen introducer with dilator inserted over wire and into IJ vein.  Dilator and wire removed.  Blood aspirated from both lumens of introducer that were then flushed with NS.  Table returned to flat position.  Introducer secured with 3.0 silk suture X 3. Sterile dressing applied.  No complications noted.   Procedure Name: Intubation Date/Time: 08/12/2015 6:26 PM Performed by: Shirlyn Goltz Pre-anesthesia Checklist: Emergency Drugs available, Suction available, Patient being monitored and Patient identified Patient Re-evaluated:Patient Re-evaluated prior to inductionOxygen Delivery Method: Circle system utilized Preoxygenation: Pre-oxygenation with 100% oxygen Intubation Type: IV induction Ventilation: Oral airway inserted -  appropriate to patient size and Mask ventilation with difficulty Laryngoscope Size: Glidescope and 4 Grade View: Grade I Tube type: Oral Tube size: 7.5 mm Number of attempts: 1 Airway Equipment and Method: Stylet and Video-laryngoscopy Placement Confirmation: ETT inserted through vocal cords under direct vision,  positive ETCO2 and breath sounds checked- equal and bilateral Secured at: 22 cm Tube secured with: Tape Dental Injury: Teeth and Oropharynx as per pre-operative assessment    Anesthesia Procedure Note A Line Arrow Pen Left Radial Arterial Line Placement: Chlorhexidine prep,# 22 Ga. Arrow TW introducer needle inserted into the patient's Left radial artery, wire advanced, and # 20 Ga. 4.45 cm. Arrow catheter advanced over the wire and into the radial artery without difficulty.  The needle and wire were withdrawn and there was good blood flow. The catheter was connected via previously flushed high Durometer tubing to the transducer.  Blood was aspirated from the artery and tubing via the stopcock at the transducer.  The line and catheter was then flushed with NS.  Arterial catheter secured with a StatLock stabilization device.  2.5 cm BioPatch disc and sterile Tegaderm dressing applied.  No complications.  Anesthesia Procedure Note IJ Central Line Placement:  Trendelenburg position, head turned, chlorhexidine prep and drape.  #18 (2.5 inch) IV catheter inserted into L IJ vein, seeker needle,  IV stylet removed and blood easily aspirated from catheter.  Negative transduction.  0.89 mm wire inserted through IV catheter and into the IJ vein and IV catheter removed.  Incision made with # 11 scalpel along wire and through skin and subcutaneous tissue.  #2-lumen introducer with dilator inserted over wire and into IJ vein.  Dilator and wire removed.  Blood aspirated from both lumens of introducer that  were then flushed with NS.  Table returned to flat position.  Introducer secured with 3.0 silk  suture X 3. Sterile dressing applied.  No complications noted.   Procedure Name: Intubation Date/Time: 08/12/2015 11:45 PM Performed by: Claris Che Pre-anesthesia Checklist: Emergency Drugs available Preoxygenation: Pre-oxygenation with 100% oxygen Intubation Type: Rapid sequence and Cricoid Pressure applied Ventilation: Oral airway inserted - appropriate to patient size Laryngoscope Size: Glidescope Grade View: Grade II Tube type: Subglottic suction tube Tube size: 7.5 mm Number of attempts: 1 Airway Equipment and Method: Stylet Placement Confirmation: positive ETCO2 and breath sounds checked- equal and bilateral Secured at: 23 cm Tube secured with: Tape Dental Injury: Teeth and Oropharynx as per pre-operative assessment

## 2015-08-12 NOTE — Progress Notes (Signed)
I saw and evaluated the patient. I personally confirmed the key portions of Dr. Elon Jester history and exam and reviewed pertinent patient test results. The assessment, diagnosis, and plan were formulated together and I agree with the documentation in the resident's note.  With recent history of Staph bacteremia and progressive pain despite conservative therapy the concern for an osteomyelitis was raised.  Interestingly, he was afebrile and had a non-focal neurologic examination while in the clinic.  Imaging supported this concern and he has subsequently been admitted tot he IMTS for further care.

## 2015-08-12 NOTE — Brief Op Note (Signed)
08/10/2015 - 08/12/2015  11:14 PM  PATIENT:  [name]  49 y.o. male  PRE-OPERATIVE DIAGNOSIS:  Thoracic epidural abscess  POST-OPERATIVE DIAGNOSIS:  Thoracic epidural abscess  PROCEDURE:  Procedure(s) with comments: T1-T7 posterior lateral fusion with pedicle screw instrumentation (N/A) - T1-T7 posterior lateral fusion with pedicle screw instrumentation  SURGEON:  Surgeon(s) and Role:    * Earnie Larsson, MD - Primary    * Eustace Moore, MD - Assisting  PHYSICIAN ASSISTANT:   ASSISTANTS:    ANESTHESIA:   general  EBL:  Total I/O In: [id number] [I.V.:500; Blood:670] Out: 850 [Blood:850]  BLOOD ADMINISTERED:2 units CC PRBC  DRAINS: (med) Hemovact drain(s) in the epidural space with  Suction Open   LOCAL MEDICATIONS USED:  MARCAINE     SPECIMEN:  No Specimen  DISPOSITION OF SPECIMEN:  N/A  COUNTS:  YES  TOURNIQUET:  * No tourniquets in log *  DICTATION: .Dragon Dictation  PLAN OF CARE: Admit to inpatient   PATIENT DISPOSITION:  PACU - hemodynamically stable.   Delay start of Pharmacological VTE agent (>24hrs) due to surgical blood loss or risk of bleeding: yes

## 2015-08-12 NOTE — Anesthesia Preprocedure Evaluation (Addendum)
Anesthesia Evaluation  Patient identified by MRN, date of birth, ID band Patient awake    Reviewed: Allergy & Precautions, NPO status , Patient's Chart, lab work & pertinent test results  Airway Mallampati: II  TM Distance: >3 FB Neck ROM: Full    Dental no notable dental hx. (+) Missing, Poor Dentition   Pulmonary sleep apnea ,    Pulmonary exam normal  + wheezing      Cardiovascular hypertension, Pt. on medications Normal cardiovascular exam Rhythm:Regular Rate:Normal     Neuro/Psych negative neurological ROS  negative psych ROS   GI/Hepatic negative GI ROS, Neg liver ROS,   Endo/Other  diabetes, Type 2  Renal/GU ESRFRenal disease  negative genitourinary   Musculoskeletal  (+) Arthritis ,   Abdominal (+) + obese,   Peds  Hematology negative hematology ROS (+) anemia ,   Anesthesia Other Findings   Reproductive/Obstetrics negative OB ROS                          Anesthesia Physical Anesthesia Plan  ASA: III  Anesthesia Plan: General   Post-op Pain Management:    Induction: Intravenous  Airway Management Planned: Oral ETT  Additional Equipment:   Intra-op Plan:   Post-operative Plan: Extubation in OR  Informed Consent: I have reviewed the patients History and Physical, chart, labs and discussed the procedure including the risks, benefits and alternatives for the proposed anesthesia with the patient or authorized representative who has indicated his/her understanding and acceptance.   Dental advisory given  Plan Discussed with: CRNA  Anesthesia Plan Comments:         Anesthesia Quick Evaluation                                  Anesthesia Evaluation  Patient identified by MRN, date of birth, ID band  Reviewed: Unable to perform ROS - Chart review only  Airway       Dental   Pulmonary shortness of breath, pneumonia -, Recent URI ,  Emergency trach performed  this am, now to OR for completion trach   + decreased breath sounds      Cardiovascular hypertension, Rate:Tachycardia     Neuro/Psych    GI/Hepatic   Endo/Other  diabetesMorbid obesity  Renal/GU ESRF and DialysisRenal disease     Musculoskeletal   Abdominal   Peds  Hematology  (+) anemia ,   Anesthesia Other Findings   Reproductive/Obstetrics                           Anesthesia Physical Anesthesia Plan  ASA: IV  Anesthesia Plan: General   Post-op Pain Management:    Induction: Inhalational  Airway Management Planned: Tracheostomy  Additional Equipment:   Intra-op Plan:   Post-operative Plan: Post-operative intubation/ventilation  Informed Consent: I have reviewed the patients History and Physical, chart, labs and discussed the procedure including the risks, benefits and alternatives for the proposed anesthesia with the patient or authorized representative who has indicated his/her understanding and acceptance.   Dental advisory given  Plan Discussed with: CRNA and Surgeon  Anesthesia Plan Comments:         Anesthesia Quick Evaluation  Anesthesia Evaluation  Patient identified by MRN, date of birth, ID band Patient awake    Reviewed: Allergy & Precautions, H&P , NPO status , Patient's Chart, lab work & pertinent test results, reviewed documented beta blocker date and time   History of Anesthesia Complications Negative for: history of anesthetic complications  Airway Mallampati: II  Neck ROM: Full    Dental  (+) Teeth Intact and Dental Advisory Given   Pulmonary    + decreased breath sounds      Cardiovascular hypertension, Pt. on medications Rhythm:Regular     Neuro/Psych    GI/Hepatic   Endo/Other  Well Controlled, Insulin DependentMorbid obesity  Renal/GU      Musculoskeletal   Abdominal (+) + obese,   Peds  Hematology   Anesthesia Other  Findings   Reproductive/Obstetrics                          Anesthesia Physical Anesthesia Plan  ASA: IV  Anesthesia Plan: MAC   Post-op Pain Management:    Induction: Intravenous  Airway Management Planned: Simple Face Mask  Additional Equipment:   Intra-op Plan:   Post-operative Plan:   Informed Consent: I have reviewed the patients History and Physical, chart, labs and discussed the procedure including the risks, benefits and alternatives for the proposed anesthesia with the patient or authorized representative who has indicated his/her understanding and acceptance.   Dental advisory given  Plan Discussed with: CRNA and Surgeon  Anesthesia Plan Comments:         Anesthesia Quick Evaluation                                   Anesthesia Evaluation  Patient identified by MRN, date of birth, ID band  Reviewed: Unable to perform ROS - Chart review only  Airway       Dental   Pulmonary shortness of breath, pneumonia -, Recent URI ,  Emergency trach performed this am, now to OR for completion trach   + decreased breath sounds      Cardiovascular hypertension, Rate:Tachycardia     Neuro/Psych    GI/Hepatic   Endo/Other  diabetesMorbid obesity  Renal/GU ESRF and DialysisRenal disease     Musculoskeletal   Abdominal   Peds  Hematology  (+) anemia ,   Anesthesia Other Findings   Reproductive/Obstetrics                          Lab Results  Component Value Date   WBC 10.0 08/12/2015   HGB 9.6* 08/12/2015   HCT 30.0* 08/12/2015   MCV 99.3 08/12/2015   PLT 339 08/12/2015   Lab Results  Component Value Date   CREATININE 9.72* 08/12/2015   BUN 35* 08/12/2015   NA 131* 08/12/2015   K 4.0 08/12/2015   CL 86* 08/12/2015   CO2 30 08/12/2015   Lab Results  Component Value Date   INR 1.32 07/14/2015   INR 1.25 07/12/2015   INR 1.30 07/07/2015    Anesthesia Physical Anesthesia  Plan  ASA: IV  Anesthesia Plan: General   Post-op Pain Management:    Induction: Inhalational  Airway Management Planned: Tracheostomy  Additional Equipment:   Intra-op Plan:   Post-operative Plan: Post-operative intubation/ventilation  Informed Consent: I have reviewed the patients History and Physical, chart, labs and discussed the procedure including the risks,  benefits and alternatives for the proposed anesthesia with the patient or authorized representative who has indicated his/her understanding and acceptance.   Dental advisory given  Plan Discussed with: CRNA and Surgeon  Anesthesia Plan Comments:         Anesthesia Quick Evaluation

## 2015-08-12 NOTE — Progress Notes (Signed)
Patient seen and examined. Case d/w residents in detail. I agree with findings and plan as documented in Dr. Serita Grit note  Patient feels well today. Still with some persistent back pain. No fevers. He underwent an MRI of his T spine yesterday with findings at T4-5 consistent with osteomyelitis/discitis. Neurosurgery follow up appreciated - Patient to undergo T4-5 laminectomy with evacuation of epidural abscess and decompression and fusion today. We will start cefazolin after procedure to cover for presumed staph lugdenesis (noted on last admission) and f/u biopsy cultures. C/w pain control for now.   Patient has no medical contraindications for proposed procedure at this time

## 2015-08-13 DIAGNOSIS — R6521 Severe sepsis with septic shock: Secondary | ICD-10-CM

## 2015-08-13 DIAGNOSIS — A419 Sepsis, unspecified organism: Secondary | ICD-10-CM | POA: Diagnosis not present

## 2015-08-13 DIAGNOSIS — J9601 Acute respiratory failure with hypoxia: Secondary | ICD-10-CM

## 2015-08-13 LAB — GLUCOSE, CAPILLARY
GLUCOSE-CAPILLARY: 156 mg/dL — AB (ref 65–99)
GLUCOSE-CAPILLARY: 159 mg/dL — AB (ref 65–99)
GLUCOSE-CAPILLARY: 161 mg/dL — AB (ref 65–99)
GLUCOSE-CAPILLARY: 164 mg/dL — AB (ref 65–99)
GLUCOSE-CAPILLARY: 199 mg/dL — AB (ref 65–99)
Glucose-Capillary: 130 mg/dL — ABNORMAL HIGH (ref 65–99)

## 2015-08-13 LAB — TYPE AND SCREEN
ABO/RH(D): B POS
ANTIBODY SCREEN: NEGATIVE
UNIT DIVISION: 0
Unit division: 0

## 2015-08-13 LAB — BASIC METABOLIC PANEL
ANION GAP: 11 (ref 5–15)
BUN: 22 mg/dL — ABNORMAL HIGH (ref 6–20)
CHLORIDE: 97 mmol/L — AB (ref 101–111)
CO2: 27 mmol/L (ref 22–32)
Calcium: 7.9 mg/dL — ABNORMAL LOW (ref 8.9–10.3)
Creatinine, Ser: 6.79 mg/dL — ABNORMAL HIGH (ref 0.61–1.24)
GFR calc Af Amer: 10 mL/min — ABNORMAL LOW (ref >60)
GFR, EST NON AFRICAN AMERICAN: 8 mL/min — AB (ref >60)
Glucose, Bld: 217 mg/dL — ABNORMAL HIGH (ref 65–99)
POTASSIUM: 4.3 mmol/L (ref 3.5–5.1)
SODIUM: 135 mmol/L (ref 135–145)

## 2015-08-13 LAB — BLOOD GAS, ARTERIAL
ACID-BASE DEFICIT: 0.1 mmol/L (ref 0.0–2.0)
BICARBONATE: 24.9 meq/L — AB (ref 20.0–24.0)
DRAWN BY: 364961
FIO2: 0.6
LHR: 18 {breaths}/min
O2 SAT: 98 %
PEEP/CPAP: 5 cmH2O
Patient temperature: 99.2
TCO2: 26.3 mmol/L (ref 0–100)
VT: 550 mL
pCO2 arterial: 47 mmHg — ABNORMAL HIGH (ref 35.0–45.0)
pH, Arterial: 7.345 — ABNORMAL LOW (ref 7.350–7.450)
pO2, Arterial: 106 mmHg — ABNORMAL HIGH (ref 80.0–100.0)

## 2015-08-13 LAB — TRIGLYCERIDES: TRIGLYCERIDES: 217 mg/dL — AB (ref <150)

## 2015-08-13 LAB — POCT I-STAT 4, (NA,K, GLUC, HGB,HCT)
GLUCOSE: 193 mg/dL — AB (ref 65–99)
Glucose, Bld: 144 mg/dL — ABNORMAL HIGH (ref 65–99)
HEMATOCRIT: 28 % — AB (ref 39.0–52.0)
HEMATOCRIT: 30 % — AB (ref 39.0–52.0)
HEMOGLOBIN: 9.5 g/dL — AB (ref 13.0–17.0)
Hemoglobin: 10.2 g/dL — ABNORMAL LOW (ref 13.0–17.0)
POTASSIUM: 4 mmol/L (ref 3.5–5.1)
Potassium: 3.5 mmol/L (ref 3.5–5.1)
Sodium: 136 mmol/L (ref 135–145)
Sodium: 134 mmol/L — ABNORMAL LOW (ref 135–145)

## 2015-08-13 LAB — POCT I-STAT 7, (LYTES, BLD GAS, ICA,H+H)
Acid-Base Excess: 5 mmol/L — ABNORMAL HIGH (ref 0.0–2.0)
BICARBONATE: 29.6 meq/L — AB (ref 20.0–24.0)
Calcium, Ion: 1.06 mmol/L — ABNORMAL LOW (ref 1.12–1.23)
HCT: 26 % — ABNORMAL LOW (ref 39.0–52.0)
Hemoglobin: 8.8 g/dL — ABNORMAL LOW (ref 13.0–17.0)
O2 Saturation: 100 %
PCO2 ART: 43.7 mmHg (ref 35.0–45.0)
PO2 ART: 316 mmHg — AB (ref 80.0–100.0)
Potassium: 3.8 mmol/L (ref 3.5–5.1)
Sodium: 134 mmol/L — ABNORMAL LOW (ref 135–145)
TCO2: 31 mmol/L (ref 0–100)
pH, Arterial: 7.435 (ref 7.350–7.450)

## 2015-08-13 LAB — PREPARE RBC (CROSSMATCH)

## 2015-08-13 LAB — PTH, INTACT AND CALCIUM
Calcium, Total (PTH): 7.7 mg/dL — ABNORMAL LOW (ref 8.7–10.2)
PTH: 406 pg/mL — AB (ref 15–65)

## 2015-08-13 LAB — MRSA PCR SCREENING: MRSA by PCR: NEGATIVE

## 2015-08-13 MED ORDER — SODIUM CHLORIDE 0.9 % IV SOLN
2.0000 ug/min | INTRAVENOUS | Status: DC
  Administered 2015-08-13: 5 ug/min via INTRAVENOUS
  Filled 2015-08-13: qty 4

## 2015-08-13 MED ORDER — FENTANYL BOLUS VIA INFUSION
50.0000 ug | INTRAVENOUS | Status: DC | PRN
  Filled 2015-08-13: qty 50

## 2015-08-13 MED ORDER — FENTANYL CITRATE (PF) 100 MCG/2ML IJ SOLN: 50.0000 ug | Freq: Once | INTRAMUSCULAR | Status: DC

## 2015-08-13 MED ORDER — PROMETHAZINE HCL 25 MG/ML IJ SOLN: 6.2500 mg | INTRAMUSCULAR | Status: DC | PRN

## 2015-08-13 MED ORDER — CEFAZOLIN SODIUM-DEXTROSE 2-3 GM-% IV SOLR
2.0000 g | Freq: Once | INTRAVENOUS | Status: AC
  Administered 2015-08-13: 2 g via INTRAVENOUS
  Filled 2015-08-13: qty 50

## 2015-08-13 MED ORDER — CEFAZOLIN SODIUM-DEXTROSE 2-3 GM-% IV SOLR
2.0000 g | INTRAVENOUS | Status: DC
  Administered 2015-08-14 – 2015-08-17 (×2): 2 g via INTRAVENOUS
  Filled 2015-08-13 (×4): qty 50

## 2015-08-13 MED ORDER — INSULIN ASPART 100 UNIT/ML ~~LOC~~ SOLN
0.0000 [IU] | SUBCUTANEOUS | Status: DC
  Administered 2015-08-13 – 2015-08-14 (×6): 4 [IU] via SUBCUTANEOUS
  Administered 2015-08-14: 7 [IU] via SUBCUTANEOUS
  Administered 2015-08-14 (×2): 4 [IU] via SUBCUTANEOUS
  Administered 2015-08-14: 7 [IU] via SUBCUTANEOUS
  Administered 2015-08-15: 4 [IU] via SUBCUTANEOUS
  Administered 2015-08-15: 3 [IU] via SUBCUTANEOUS
  Administered 2015-08-15 (×2): 4 [IU] via SUBCUTANEOUS
  Administered 2015-08-15: 7 [IU] via SUBCUTANEOUS
  Administered 2015-08-15: 4 [IU] via SUBCUTANEOUS
  Administered 2015-08-16: 3 [IU] via SUBCUTANEOUS
  Administered 2015-08-16 (×4): 4 [IU] via SUBCUTANEOUS
  Administered 2015-08-17: 3 [IU] via SUBCUTANEOUS
  Administered 2015-08-17: 4 [IU] via SUBCUTANEOUS

## 2015-08-13 MED ORDER — PANTOPRAZOLE SODIUM 40 MG IV SOLR
40.0000 mg | Freq: Every day | INTRAVENOUS | Status: DC
  Administered 2015-08-13: 40 mg via INTRAVENOUS
  Filled 2015-08-13: qty 40

## 2015-08-13 MED ORDER — SODIUM CHLORIDE 0.9 % IV SOLN
25.0000 ug/h | INTRAVENOUS | Status: DC
  Administered 2015-08-13: 50 ug/h via INTRAVENOUS
  Filled 2015-08-13 (×2): qty 50

## 2015-08-13 MED ORDER — MEPERIDINE HCL 25 MG/ML IJ SOLN: 6.2500 mg | INTRAMUSCULAR | Status: DC | PRN

## 2015-08-13 MED ORDER — ALBUTEROL SULFATE (2.5 MG/3ML) 0.083% IN NEBU: 2.5000 mg | INHALATION_SOLUTION | RESPIRATORY_TRACT | Status: DC | PRN

## 2015-08-13 MED ORDER — MIDAZOLAM HCL 2 MG/2ML IJ SOLN: 2.0000 mg | INTRAMUSCULAR | Status: DC | PRN

## 2015-08-13 MED ORDER — SODIUM CHLORIDE 0.9 % IV SOLN
INTRAVENOUS | Status: DC
  Administered 2015-08-13 (×2): via INTRAVENOUS

## 2015-08-13 MED ORDER — CEFAZOLIN SODIUM-DEXTROSE 2-3 GM-% IV SOLR
2.0000 g | Freq: Once | INTRAVENOUS | Status: DC
  Filled 2015-08-13: qty 50

## 2015-08-13 MED ORDER — CEFAZOLIN SODIUM-DEXTROSE 2-3 GM-% IV SOLR: 2.0000 g | Freq: Once | INTRAVENOUS | Status: DC

## 2015-08-13 MED ORDER — MIDAZOLAM HCL 2 MG/2ML IJ SOLN
2.0000 mg | INTRAMUSCULAR | Status: DC | PRN
  Administered 2015-08-13: 2 mg via INTRAVENOUS
  Filled 2015-08-13: qty 2

## 2015-08-13 MED ORDER — HYDROMORPHONE HCL 1 MG/ML IJ SOLN: 0.2500 mg | INTRAMUSCULAR | Status: DC | PRN

## 2015-08-13 MED ORDER — ASPIRIN 81 MG PO CHEW
81.0000 mg | CHEWABLE_TABLET | Freq: Every day | ORAL | Status: DC
  Administered 2015-08-14 – 2015-08-16 (×3): 81 mg via ORAL
  Filled 2015-08-13 (×3): qty 1

## 2015-08-13 MED ORDER — PROPOFOL 1000 MG/100ML IV EMUL
0.0000 ug/kg/min | INTRAVENOUS | Status: DC
  Administered 2015-08-13 (×2): 25 ug/kg/min via INTRAVENOUS
  Filled 2015-08-13 (×2): qty 100

## 2015-08-13 MED ORDER — PROPOFOL 500 MG/50ML IV EMUL
INTRAVENOUS | Status: DC | PRN
  Administered 2015-08-12: 125 ug/kg/min via INTRAVENOUS

## 2015-08-13 MED ORDER — PROPOFOL 1000 MG/100ML IV EMUL: 0.0000 ug/kg/min | INTRAVENOUS | Status: DC

## 2015-08-13 MED ORDER — CHLORHEXIDINE GLUCONATE 0.12% ORAL RINSE (MEDLINE KIT)
15.0000 mL | Freq: Two times a day (BID) | OROMUCOSAL | Status: DC
  Administered 2015-08-13 – 2015-08-17 (×5): 15 mL via OROMUCOSAL

## 2015-08-13 MED ORDER — ANTISEPTIC ORAL RINSE SOLUTION (CORINZ)
7.0000 mL | Freq: Four times a day (QID) | OROMUCOSAL | Status: DC
  Administered 2015-08-13 – 2015-08-16 (×6): 7 mL via OROMUCOSAL

## 2015-08-13 NOTE — Progress Notes (Signed)
Admit: 08/10/2015 LOS: 2  63M ESRD MWF with T4-T5 pathological fracture presumed OM s/p surgical correctomi 08/12/15  Subjective:  Surgery yesterday Required reintubation in PACU Phlegom noted operatively, cultures sent Now on cefazolin HD yesterday, 3L UF    11/02 0701 - 11/03 0700 In: 2256.8 [P.O.:60; I.V.:1526.8; Blood:670] Out: 2985 [Drains:135; Blood:850]  Filed Weights   08/11/15 2100 08/12/15 0700 08/12/15 1130  Weight: 138.8 kg (306 lb) 140.9 kg (310 lb 10.1 oz) 138.9 kg (306 lb 3.5 oz)    Scheduled Meds: . antiseptic oral rinse  7 mL Mouth Rinse QID  . aspirin  81 mg Oral Daily  . calcium acetate  1,334 mg Oral TID WC  . [START ON 08/14/2015]  ceFAZolin (ANCEF) IV  2 g Intravenous Q M,W,F-HD  .  ceFAZolin (ANCEF) IV  2 g Intravenous Once  . chlorhexidine gluconate  15 mL Mouth Rinse BID  . cholecalciferol  1,000 Units Oral Daily  . fentaNYL (SUBLIMAZE) injection  50 mcg Intravenous Once  . heparin  5,000 Units Subcutaneous 3 times per day  . insulin aspart  0-20 Units Subcutaneous 6 times per day  . pantoprazole (PROTONIX) IV  40 mg Intravenous Daily  . vancomycin       Continuous Infusions: . sodium chloride 50 mL/hr at 08/13/15 0036  . fentaNYL infusion INTRAVENOUS 100 mcg/hr (08/13/15 0200)  . norepinephrine (LEVOPHED) Adult infusion 2 mcg/min (08/13/15 0229)  . propofol (DIPRIVAN) infusion 25 mcg/kg/min (08/13/15 0722)   PRN Meds:.albuterol, fentaNYL, midazolam, midazolam  Current Labs: reviewed  B Cx x2 from 11/1: NGTD Surgical Cx 11/2: NGTD   Physical Exam:  Blood pressure 92/55, pulse 74, temperature 99.6 F (37.6 C), temperature source Axillary, resp. rate 18, height 5\' 8"  (1.727 m), weight 138.9 kg (306 lb 3.5 oz), SpO2 100 %. GEN: NAD, obese, intubated ENT: NCAT EYES: EOMI CV: RRR, no rub PULM: CTAB ABD: s/nt/nd SKIN: No rashes/lesiosn EXT:No LEE  Outpt HD Orders Unit: [facility] Days: MWF Time: 4h Dialyzer:  Elisio EDW: 138kg K/Ca: 2/2.5 Access: AVF, RFA Needle Size: 14g BFR/DFR: 500/800 VDRA: Hectorol 3mcg qTx EPO: Epogen 5000 units qTx Heparin: 4500 IU at start, then infusion 1000 IU/hr.  A/P 1. ESRD:  1. MWF [place] via AVF 2. Keep on schedule 3. Holding heparin currently 2. Pathoogical Fracture at T4 and T5 1. S/p laminectomy 11/2 2. Likely OM, Cx pending 3. On cefazolin, recent Stah Lugdunesis bacteremia 4. 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 Postoperative VDRF: per CCM 4. HTN/Vol: Aim for EDW, BP stable 5. Anemia: Hb at goal siwtch to aranesp as inpt 6. MBD: cont PhosLo, cinacaclet,  1. PTH 407 2. Phos mildly increased follow for now  [name] MD 08/13/2015, 8:41 AM   Recent Labs Lab 08/10/15 2217 08/12/15 0737 08/12/15 0738 08/13/15 0105  NA 132* 131*  --  135  K 3.4* 4.0  --  4.3  CL 88* 86*  --  97*  CO2 31 30  --  27  GLUCOSE 165* 219*  --  217*  BUN 19 35*  --  22*  CREATININE 6.73* 9.72*  --  6.79*  CALCIUM 8.9 8.5* 7.7* 7.9*  PHOS  --  7.0*  --   --     Recent Labs Lab 08/10/15 2217 08/12/15 0737  WBC 13.5* 10.0  NEUTROABS 10.7* 7.3  HGB 10.1* 9.6*  HCT 31.3* 30.0*  MCV 97.5 99.3  PLT 333 339

## 2015-08-13 NOTE — Care Management Note (Signed)
Case Management Note  Patient Details  Name: Seth [name] MRN: [id number] Date of Birth: 06/14/1966  Subjective/Objective:  Pt admitted on 08/10/15 s/p T4-T5 decompressive laminectomy with microdiscectomy.  PTA, pt independent of ADLS.                    Action/Plan: Will follow for discharge planning as pt progresses.  Pt extubated today.  Would recommend PT/OT consults when able to tolerate.    Expected Discharge Date:                  Expected Discharge Plan:  [place]  In-House Referral:     Discharge planning Services  CM Consult  Post Acute Care Choice:    Choice offered to:     DME Arranged:    DME Agency:     HH Arranged:    [place] Agency:     Status of Service:  In process, will continue to follow  Medicare Important Message Given:    Date Medicare IM Given:    Medicare IM give by:    Date Additional Medicare IM Given:    Additional Medicare Important Message give by:     If discussed at [place] of Stay Meetings, dates discussed:    Additional Comments:  Reinaldo Raddle, RN, BSN  Trauma/Neuro ICU Case Manager [phone]

## 2015-08-13 NOTE — Progress Notes (Signed)
Postop day 1. Patient awakens easily. Appears aware and appropriate. Follows commands readily with all 4 extremities. Strength appears normal and lower extremities. Patient states pain is under control.  Afebrile. Heart rate and blood pressure acceptable. Urine output not applicable given renal failure. Drain output moderately low.  Patient looks good postoperatively. Plan for extubation per critical care this morning. May begin to mobilize at that point. Probable transfer back to floor tomorrow.

## 2015-08-13 NOTE — Procedures (Signed)
Extubation Procedure Note  Patient Details:   Name: COBIN HIRTZ DOB: 04/29/1966 MRN: [id number]   Airway Documentation:     Evaluation  O2 sats: stable throughout Complications: No apparent complications Patient did tolerate procedure well. Bilateral Breath Sounds: Clear, Diminished Suctioning: Airway, Oral Yes  Hope Pigeon, MA 08/13/2015, 10:38 AM

## 2015-08-13 NOTE — Progress Notes (Addendum)
ANTIBIOTIC CONSULT NOTE - INITIAL  Pharmacy Consult for cefzolin Indication: osteomyelitis   No Known Allergies  Patient Measurements: Height: 5\' 8"  (172.7 cm) Weight: (!) 306 lb 3.5 oz (138.9 kg) IBW/kg (Calculated) : 68.4 Adjusted Body Weight:   Vital Signs: Temp: 98.2 F (36.8 C) (11/02 1706) Temp Source: Oral (11/02 1706) BP: 59/35 mmHg (11/03 0030) Pulse Rate: 80 (11/03 0045) Intake/Output from previous day: 11/02 0701 - 11/03 0700 In: 1630 [P.O.:60; I.V.:900; Blood:670] Out: 2850 [Blood:850] Intake/Output from this shift: Total I/O In: 1570 [I.V.:900; Blood:670] Out: 850 [Blood:850]  Labs:  Recent Labs  08/10/15 2217 08/12/15 0737  WBC 13.5* 10.0  HGB 10.1* 9.6*  PLT 333 339  CREATININE 6.73* 9.72*   Estimated Creatinine Clearance: 12.3 mL/min (by C-G formula based on Cr of 9.72). No results for input(s): VANCOTROUGH, VANCOPEAK, VANCORANDOM, GENTTROUGH, GENTPEAK, GENTRANDOM, TOBRATROUGH, TOBRAPEAK, TOBRARND, AMIKACINPEAK, AMIKACINTROU, AMIKACIN in the last 72 hours.   Microbiology: Recent Results (from the past 720 hour(s))  Culture, blood (routine x 2)     Status: None   Collection Time: 07/16/15 12:00 PM  Result Value Ref Range Status   Specimen Description BLOOD A-LINE DRAW  Final   Special Requests   Final    BOTTLES DRAWN AEROBIC AND ANAEROBIC 3CC AEROBIC,3CC ANAEROBIC   Culture NO GROWTH 5 DAYS  Final   Report Status 07/21/2015 FINAL  Final  Culture, blood (routine x 2)     Status: None   Collection Time: 07/16/15 12:15 PM  Result Value Ref Range Status   Specimen Description BLOOD A-LINE DRAW  Final   Special Requests   Final    BOTTLES DRAWN AEROBIC AND ANAEROBIC 3CC AEROBIC,3CC ANAEROBIC   Culture NO GROWTH 5 DAYS  Final   Report Status 07/21/2015 FINAL  Final  Blood culture (routine x 2)     Status: None (Preliminary result)   Collection Time: 08/11/15  2:45 AM  Result Value Ref Range Status   Specimen Description BLOOD LEFT ARM  Final   Special Requests BOTTLES DRAWN AEROBIC AND ANAEROBIC 5CC  Final   Culture NO GROWTH 1 DAY  Final   Report Status PENDING  Incomplete  Blood culture (routine x 2)     Status: None (Preliminary result)   Collection Time: 08/11/15  8:27 AM  Result Value Ref Range Status   Specimen Description BLOOD LEFT HAND  Final   Special Requests BOTTLES DRAWN AEROBIC ONLY  5CC  Final   Culture NO GROWTH 1 DAY  Final   Report Status PENDING  Incomplete    Medical History: Past Medical History  Diagnosis Date  . Hypertension   . ESRD (end stage renal disease) on dialysis ([place])   . 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 Sleep apnea     USES CPAP  . Diabetes mellitus     INSULIN DEPENDENT DIABETES  . Discitis 07/2015    Medications:  See EMR  Assessment: 49 yo male with ESRD and recent staph lugdunesis bacteremia, s/p 4 weeks of treatment, now initiating abx for osteomyelitis discitis. Ancef ordered yesterday but never given due to decision to wait until after procedure. Pt underwent I&D and decompressive laminectomy at T4-T5 yesterday evening. Received peri-operative ceftriaxone and vancomycin around 2130 yesterday. Vancomycin dose was likely sub-par with body habitus, therefore due to severity of infection, will give Ancef earlier than normal.    Goal of Therapy:  Resolution of infection  Plan:  -Cefazolin 2 g IV x1 at 0900 then 2 g with HD -Monitor cultures, HD schedule  Hughes Better, PharmD, BCPS Clinical Pharmacist Pager: [phone] 08/13/2015 1:36 AM

## 2015-08-13 NOTE — Progress Notes (Signed)
[facility] Progress Note Patient Name: Seth [name] DOB: 07/22/1966 MRN: [id number]   Date of Service  08/13/2015  HPI/Events of Note  OSA - patient extubated today. Uses CPAP at home for OSA.   eICU Interventions  Will order auto titrating CPAP Q HS.     Intervention Category Intermediate Interventions: Other:  Willean Schurman Cornelia Copa 08/13/2015, 9:22 PM

## 2015-08-13 NOTE — Anesthesia Postprocedure Evaluation (Signed)
Anesthesia Post Note  Patient: Seth [name]  Procedure(s) Performed: Procedure(s) (LRB): T1-T7 posterior lateral fusion with pedicle screw instrumentation (N/A)  Anesthesia type: General  Patient location: ICU  Post pain: Pain level controlled  Post assessment: Post-op Vital signs reviewed  Last Vitals:  Filed Vitals:   08/13/15 0045  BP:   Pulse: 80  Temp:   Resp: 18    Post vital signs: stable  Level of consciousness: Patient remains intubated per anesthesia plan  Complications: No apparent anesthesia complications

## 2015-08-13 NOTE — Transfer of Care (Signed)
Immediate Anesthesia Transfer of Care Note  Patient: Seth [name]  Procedure(s) Performed: Procedure(s) with comments: T1-T7 posterior lateral fusion with pedicle screw instrumentation (N/A) - T1-T7 posterior lateral fusion with pedicle screw instrumentation  Patient Location: ICU  Anesthesia Type:General  Level of Consciousness: sedated, unresponsive and Patient remains intubated per anesthesia plan  Airway & Oxygen Therapy: Patient remains intubated per anesthesia plan and Patient placed on Ventilator (see vital sign flow sheet for setting)  Post-op Assessment: Report given to RN and Post -op Vital signs reviewed and stable  Post vital signs: Reviewed and unstable  Last Vitals:  Filed Vitals:   08/13/15 0045  BP:   Pulse: 80  Temp:   Resp: 18    Complications: No apparent anesthesia complications

## 2015-08-13 NOTE — Progress Notes (Signed)
[facility] Progress Note Patient Name: Seth [name] DOB: Jul 22, 1966 MRN: [id number]   Date of Service  08/13/2015  HPI/Events of Note  S/p re-intubation  eICU Interventions  sedation ordered     Intervention Category Minor Interventions: Agitation / anxiety - evaluation and management  Flora Lipps 08/13/2015, 12:27 AM

## 2015-08-13 NOTE — Consult Note (Signed)
PULMONARY / CRITICAL CARE MEDICINE   Name: Seth [name] MRN: [id number] DOB: Jun 10, 1966    ADMISSION DATE:  08/10/2015 CONSULTATION DATE:  08/13/15  REFERRING MD :  Annette Stable  CHIEF COMPLAINT:  Vent management post op  INITIAL PRESENTATION:  49 y.o. M admitted to [facility] 10/31 for back pain and tingling / weakness in legs.  Found to have destructive changes at T4 - T5 concerning for discitis.  Taken to OR 11/2 and required re-intubation in PACU.  PCCM called for vent management.   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 MRI Tspine 11/1 >>> findings c/w discitis / osteo at T4-T5.  SIGNIFICANT EVENTS: 10/31 - admitted. 11/2 - taken to OR for decompressive laminectomy.   HISTORY OF PRESENT ILLNESS:  Pt is encephalopathic; therefore, this HPI is obtained from chart review. Seth [name] is a 49 y.o. M with PMH as outlined below.  He was admitted to [facility] 10/31 with weakness and tingling in his legs as well as back pain.  He had recent hospitalization (07/03/15 through 07/14/15) for staph lugdunensis and is s/p 2 weeks of cefazolin therapy (had negative TEE).  He had MRI that showed destructive changes at T4 - T5 concerning for discitis.  He was seen by neurosurgery in consultation and decision was made to take him to the OR for surgical debridement.  On evening of 11/2, he was taken to the OR and had T4 - T5 decompressive laminectomy with left T4 and T5 transpedicular corpetomy of T4 and T5 with microdissection, T1-T7 posterior lateral arthrodesis utilizing pedicle screw fixation and morselized allograft.  Following the procedure, he was extubated in PACU but required re-intubation.  He returned to the neuro ICU and PCCM was called for vent management.   PAST MEDICAL HISTORY :   has a past medical history of Hypertension; ESRD (end stage renal disease) on dialysis ([place]); RETINAL DETACHMENT,  HX OF (06/20/2007); Sleep apnea; Diabetes mellitus; and Discitis (07/2015).  has past surgical history that includes AV fistula placement (05/03/2012); Tracheostomy tube placement (N/A, 08/20/2013); and TEE without cardioversion (N/A, 07/10/2015). Prior to Admission medications   Medication Sig Start Date End Date Taking? Authorizing Provider  amLODipine (NORVASC) 10 MG tablet Take 1 tablet (10 mg total) by mouth daily. 09/19/13  Yes Donita Brooks, NP  aspirin EC 81 MG tablet Take 81 mg by mouth daily.   Yes Historical Provider, MD  calcium acetate (PHOSLO) 667 MG capsule Take 2 capsules (1,334 mg total) by mouth 3 (three) times daily with meals. 09/19/13  Yes Donita Brooks, NP  Cholecalciferol (VITAMIN D PO) Take 1 tablet by mouth daily.   Yes Historical Provider, MD  cyclobenzaprine (FLEXERIL) 10 MG tablet Take 1 tablet (10 mg total) by mouth 3 (three) times daily as needed for muscle spasms. 08/05/15  Yes Zada Finders, MD  insulin aspart (NOVOLOG) 100 UNIT/ML injection Inject 0-15 Units into the skin 3 (three) times daily with meals. CBG < 70: drink orange juice and recheck glucose or use glucose tabs CBG > 400: call MD   CBG 70 - 120: 0 units CBG 121 - 150: 3 units CBG 151 - 200: 4 units CBG 201 - 250: 7 units CBG 251 - 300: 11 units CBG 301 - 350: 15 units CBG 351 - 400: 20 units 09/19/13  Yes Donita Brooks, NP  albuterol (PROVENTIL) (2.5 MG/3ML) 0.083% nebulizer solution Take 3  mLs (2.5 mg total) by nebulization every 6 (six) hours as needed for wheezing or shortness of breath. Patient not taking: Reported on 07/17/2015 07/14/15   Jones Bales, MD  ceFAZolin (ANCEF) 2-3 GM-% SOLR Inject 50 mLs (2 g total) into the vein every Monday, Wednesday, and Friday with hemodialysis. Patient not taking: Reported on 08/05/2015 07/14/15   Iline Oven, MD  Darbepoetin Alfa (ARANESP) 150 MCG/0.3ML SOSY injection Inject 0.3 mLs (150 mcg total) into the vein every Monday with hemodialysis. 07/14/15    Iline Oven, MD  diazepam (VALIUM) 2 MG tablet Take 1 tablet (2 mg total) by mouth every 8 (eight) hours as needed for muscle spasms. Patient not taking: Reported on 07/17/2015 06/17/15   Loleta Chance, MD  gabapentin (NEURONTIN) 300 MG capsule Take 1 capsule (300 mg total) by mouth 3 (three) times daily. 08/10/15 08/09/16  Shela Leff, MD  naproxen (NAPROSYN) 500 MG tablet Take 1 tablet (500 mg total) by mouth 2 (two) times daily with a meal. Patient not taking: Reported on 08/05/2015 07/24/15 07/23/16  Jule Ser, DO  predniSONE (DELTASONE) 20 MG tablet Take 2 tablets (40 mg total) by mouth daily. Patient not taking: Reported on 07/17/2015 07/14/15   Jones Bales, MD  traMADol (ULTRAM) 50 MG tablet Take 1 tablet (50 mg total) by mouth every 6 (six) hours as needed. For back pain Patient not taking: Reported on 08/05/2015 07/29/15   Billy Fischer, MD  Vitamin D, Ergocalciferol, (DRISDOL) 50000 UNITS CAPS capsule Take 1 capsule (50,000 Units total) by mouth every 7 (seven) days. *takes on Friday* 09/19/13   Donita Brooks, NP   No Known Allergies  FAMILY HISTORY:  Family History  Problem Relation Age of Onset  . Diabetes Mother   . Diabetes Sister   . Diabetes Brother     SOCIAL HISTORY:  reports that he has never smoked. He has never used smokeless tobacco. He reports that he drinks about 0.6 oz of alcohol per week. He reports that he uses illicit drugs (Marijuana).  REVIEW OF SYSTEMS:  Unable to obtain as pt is encephalopathic.  SUBJECTIVE:   VITAL SIGNS: Temp:  [97.9 F (36.6 C)-98.6 F (37 C)] 98.2 F (36.8 C) (11/02 1706) Pulse Rate:  [80-99] 80 (11/03 0045) Resp:  [13-25] 18 (11/03 0045) BP: (59-158)/(35-109) 59/35 mmHg (11/03 0030) SpO2:  [95 %-100 %] 95 % (11/03 0045) Arterial Line BP: (40-58)/(23-30) 58/30 mmHg (11/03 0045) FiO2 (%):  [60 %] 60 % (11/03 0030) Weight:  [138.9 kg (306 lb 3.5 oz)-140.9 kg (310 lb 10.1 oz)] 138.9 kg (306 lb 3.5 oz) (11/02  1130) HEMODYNAMICS:   VENTILATOR SETTINGS: Vent Mode:  [-] PRVC FiO2 (%):  [60 %] 60 % Set Rate:  [18 bmp] 18 bmp Vt Set:  [550 mL] 550 mL PEEP:  [5 cmH20] 5 cmH20 Plateau Pressure:  [21 cmH20] 21 cmH20 INTAKE / OUTPUT: Intake/Output      11/02 0701 - 11/03 0700   P.O. 60   I.V. (mL/kg) 900 (6.5)   Blood 670   Total Intake(mL/kg) 1630 (11.7)   Urine (mL/kg/hr) 0 (0)   Other 2000 (0.6)   Stool 0 (0)   Blood 850 (0.3)   Total Output 2850   Net -1220       Urine Occurrence 0 x   Stool Occurrence 0 x     PHYSICAL EXAMINATION: General: Adult male, in NAD. Neuro: Sedated, somewhat agitated. HEENT: [place]/AT. PERRL, sclerae anicteric. Cardiovascular: RRR, no M/R/G.  Lungs: Respirations even and unlabored.  CTA bilaterally, No W/R/R.  Abdomen: Obese, BS x 4, soft, NT/ND. Ventral hernia noted, easily reducible. Musculoskeletal: No gross deformities, no edema.  Skin: Intact, warm, no rashes.  Wound vac in place post op.  LABS:  CBC  Recent Labs Lab 08/10/15 2217 08/12/15 0737  WBC 13.5* 10.0  HGB 10.1* 9.6*  HCT 31.3* 30.0*  PLT 333 339   Coag's No results for input(s): APTT, INR in the last 168 hours. BMET  Recent Labs Lab 08/10/15 2217 08/12/15 0737  NA 132* 131*  K 3.4* 4.0  CL 88* 86*  CO2 31 30  BUN 19 35*  CREATININE 6.73* 9.72*  GLUCOSE 165* 219*   Electrolytes  Recent Labs Lab 08/10/15 2217 08/12/15 0737  CALCIUM 8.9 8.5*  PHOS  --  7.0*   Sepsis Markers  Recent Labs Lab 08/10/15 2223  LATICACIDVEN 1.19   ABG No results for input(s): PHART, PCO2ART, PO2ART in the last 168 hours. Liver Enzymes  Recent Labs Lab 08/10/15 2217 08/12/15 0737  AST 14*  --   ALT 10*  --   ALKPHOS 99  --   BILITOT 0.5  --   ALBUMIN 3.8 3.1*   Cardiac Enzymes No results for input(s): TROPONINI, PROBNP in the last 168 hours. Glucose  Recent Labs Lab 08/11/15 1627 08/11/15 2056 08/11/15 2103 08/12/15 1215 08/12/15 1630 08/13/15 0030  GLUCAP  153* 502* 221* 153* 140* 199*    Imaging Dg Thoracic Spine 1 View  08/12/2015  CLINICAL DATA:  49 year old with discitis/osteomyelitis at T4-5. Debridement and fusion. EXAM: OPERATIVE THORACIC SPINE 1 VIEW(S) COMPARISON:  MRI thoracic spine 08/11/2015 and CT thoracic spine 08/10/2015. FINDINGS: Two AP spot images of the thoracic spine were obtained with the C-arm fluoroscopic device and submitted for interpretation postoperatively. These demonstrate bilateral pedicle screws at T1, T2, T3, T6 and T7. Osteolysis of T4 and T5 again noted. The radiologic technologist documented 39 sec of fluoroscopy time. IMPRESSION: Images obtained during debridement of discitis/osteomyelitis at T4-5 and thoracic spine fusion. Electronically Signed   By: Evangeline Dakin M.D.   On: 08/12/2015 22:51   Dg C-arm 61-120 Min  08/12/2015  CLINICAL DATA:  49 year old with discitis/osteomyelitis at T4-5. Debridement and fusion. EXAM: OPERATIVE THORACIC SPINE 1 VIEW(S) COMPARISON:  MRI thoracic spine 08/11/2015 and CT thoracic spine 08/10/2015. FINDINGS: Two AP spot images of the thoracic spine were obtained with the C-arm fluoroscopic device and submitted for interpretation postoperatively. These demonstrate bilateral pedicle screws at T1, T2, T3, T6 and T7. Osteolysis of T4 and T5 again noted. The radiologic technologist documented 39 sec of fluoroscopy time. IMPRESSION: Images obtained during debridement of discitis/osteomyelitis at T4-5 and thoracic spine fusion. Electronically Signed   By: Evangeline Dakin M.D.   On: 08/12/2015 22:51     ASSESSMENT / PLAN:  NEUROLOGIC A:   Acute encephalopathy due to sedation. 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 P:   Sedation:  Fentanyl gtt / Midazolam PRN. RASS goal: 0 to -1. Daily WUA. Neurosurgery following. D/c percocet, demerol, dilaudid. Hold outpatient gabapentin, flexeril.  PULMONARY OETT 11/2 >>> A: VDRF  following surgery 11/2. Hx OSA / probable OHS - uses CPAP nocturnally. P:   Full mechanical support, wean as able. VAP prevention measures. SBT in AM with goal extubation. CPAP nocturnally once extubated. Albuterol PRN. CXR in AM.  CARDIOVASCULAR CVL L IJ 11/2 >>> A:  Hypotension following propofol bolus after re-intubation in  PACU. Hx HTN. P:  D/c propofol. Levophed as needed to maintain goal MAP > 65. Hold outpatient amlodipine.  RENAL A:   ESRD (M/W/F). Hyponatremia - chronic. Pseudohypocalcemia - corrects to 9.22. P:   Nephrology following. NS @ 50 > KVO on 11/3 am Check ionized calcium. BMP in AM.  GASTROINTESTINAL A:   GI prophylaxis. Nutrition. P:   SUP: Pantoprazole. NPO.  HEMATOLOGIC A:   Anemia of chronic disease. VTE Prophylaxis. P:  Transfuse for Hgb < 7. SCD's / Heparin. CBC in AM.  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 Hx staph lugdunensis with negative TEE on prior admission (07/03/15 through 07/14/15). P:   BCx2 11/1 > Wound Cx 11/2 >>> Abx: Cefazolin, start date 11/2 >>   ENDOCRINE A:   DM.   P:   SSI.   Family updated: None.  Interdisciplinary Family Meeting v Palliative Care Meeting:  Due by: 11/9.   Montey Hora, [facility] Pager: [phone]  or [phone] 08/13/2015, 1:04 AM   Attending Note:  I have examined patient, reviewed labs, studies and notes. I have discussed the case with [place]s, and I agree with the data and plans as amended above. S/p laminectomy and corpectomy on 11/2 for osteo / discitis. Re-intubated in PACU. Currnetly mildly sedated on my eval, hemodynamically stable on norepi 3, weaning. Plan extubate today if he meets criteria, continue abx, wean norepi to off. Independent critical care time is 40 minutes.   Baltazar Apo, MD, PhD 08/13/2015, 8:33 AM [facility] [phone] or if no answer [phone]

## 2015-08-14 ENCOUNTER — Inpatient Hospital Stay (HOSPITAL_COMMUNITY): Payer: Medicare Other

## 2015-08-14 DIAGNOSIS — D72829 Elevated white blood cell count, unspecified: Secondary | ICD-10-CM

## 2015-08-14 DIAGNOSIS — G8918 Other acute postprocedural pain: Secondary | ICD-10-CM

## 2015-08-14 DIAGNOSIS — I10 Essential (primary) hypertension: Secondary | ICD-10-CM

## 2015-08-14 DIAGNOSIS — M4644 Discitis, unspecified, thoracic region: Secondary | ICD-10-CM

## 2015-08-14 DIAGNOSIS — Z992 Dependence on renal dialysis: Secondary | ICD-10-CM

## 2015-08-14 DIAGNOSIS — G9520 Unspecified cord compression: Secondary | ICD-10-CM

## 2015-08-14 DIAGNOSIS — N186 End stage renal disease: Secondary | ICD-10-CM

## 2015-08-14 DIAGNOSIS — M545 Low back pain: Secondary | ICD-10-CM

## 2015-08-14 DIAGNOSIS — E1165 Type 2 diabetes mellitus with hyperglycemia: Secondary | ICD-10-CM

## 2015-08-14 DIAGNOSIS — D62 Acute posthemorrhagic anemia: Secondary | ICD-10-CM

## 2015-08-14 DIAGNOSIS — Z9889 Other specified postprocedural states: Secondary | ICD-10-CM

## 2015-08-14 DIAGNOSIS — E1129 Type 2 diabetes mellitus with other diabetic kidney complication: Secondary | ICD-10-CM

## 2015-08-14 DIAGNOSIS — R7881 Bacteremia: Secondary | ICD-10-CM

## 2015-08-14 LAB — GLUCOSE, CAPILLARY
GLUCOSE-CAPILLARY: 156 mg/dL — AB (ref 65–99)
Glucose-Capillary: 160 mg/dL — ABNORMAL HIGH (ref 65–99)
Glucose-Capillary: 166 mg/dL — ABNORMAL HIGH (ref 65–99)
Glucose-Capillary: 168 mg/dL — ABNORMAL HIGH (ref 65–99)
Glucose-Capillary: 214 mg/dL — ABNORMAL HIGH (ref 65–99)
Glucose-Capillary: 221 mg/dL — ABNORMAL HIGH (ref 65–99)
Glucose-Capillary: 245 mg/dL — ABNORMAL HIGH (ref 65–99)

## 2015-08-14 LAB — BASIC METABOLIC PANEL
ANION GAP: 13 (ref 5–15)
BUN: 32 mg/dL — ABNORMAL HIGH (ref 6–20)
CHLORIDE: 95 mmol/L — AB (ref 101–111)
CO2: 26 mmol/L (ref 22–32)
Calcium: 7.7 mg/dL — ABNORMAL LOW (ref 8.9–10.3)
Creatinine, Ser: 9.24 mg/dL — ABNORMAL HIGH (ref 0.61–1.24)
GFR calc Af Amer: 7 mL/min — ABNORMAL LOW (ref >60)
GFR calc non Af Amer: 6 mL/min — ABNORMAL LOW (ref >60)
GLUCOSE: 157 mg/dL — AB (ref 65–99)
POTASSIUM: 4.6 mmol/L (ref 3.5–5.1)
Sodium: 134 mmol/L — ABNORMAL LOW (ref 135–145)

## 2015-08-14 LAB — CBC
HEMATOCRIT: 28.4 % — AB (ref 39.0–52.0)
HEMOGLOBIN: 9.3 g/dL — AB (ref 13.0–17.0)
MCH: 32.2 pg (ref 26.0–34.0)
MCHC: 32.7 g/dL (ref 30.0–36.0)
MCV: 98.3 fL (ref 78.0–100.0)
Platelets: 277 10*3/uL (ref 150–400)
RBC: 2.89 MIL/uL — ABNORMAL LOW (ref 4.22–5.81)
RDW: 16.6 % — ABNORMAL HIGH (ref 11.5–15.5)
WBC: 12.9 10*3/uL — ABNORMAL HIGH (ref 4.0–10.5)

## 2015-08-14 LAB — CALCIUM, IONIZED: CALCIUM, IONIZED, SERUM: 4.2 mg/dL — AB (ref 4.5–5.6)

## 2015-08-14 LAB — MAGNESIUM: MAGNESIUM: 1.9 mg/dL (ref 1.7–2.4)

## 2015-08-14 LAB — PHOSPHORUS: Phosphorus: 7 mg/dL — ABNORMAL HIGH (ref 2.5–4.6)

## 2015-08-14 MED ORDER — ACETAMINOPHEN 325 MG PO TABS: 650.0000 mg | ORAL_TABLET | Freq: Four times a day (QID) | ORAL | Status: DC | PRN

## 2015-08-14 MED ORDER — FENTANYL CITRATE (PF) 100 MCG/2ML IJ SOLN
12.5000 ug | INTRAMUSCULAR | Status: DC | PRN
  Administered 2015-08-14: 12.5 ug via INTRAVENOUS
  Filled 2015-08-14: qty 2

## 2015-08-14 MED ORDER — OXYCODONE-ACETAMINOPHEN 5-325 MG PO TABS
1.0000 | ORAL_TABLET | Freq: Four times a day (QID) | ORAL | Status: DC | PRN
  Administered 2015-08-14 – 2015-08-17 (×2): 1 via ORAL
  Filled 2015-08-14 (×2): qty 1

## 2015-08-14 MED ORDER — PANTOPRAZOLE SODIUM 40 MG PO TBEC
40.0000 mg | DELAYED_RELEASE_TABLET | Freq: Every day | ORAL | Status: DC
  Administered 2015-08-14 – 2015-08-16 (×3): 40 mg via ORAL
  Filled 2015-08-14 (×3): qty 1

## 2015-08-14 MED ORDER — TRAMADOL HCL 50 MG PO TABS
50.0000 mg | ORAL_TABLET | Freq: Two times a day (BID) | ORAL | Status: DC
  Administered 2015-08-14 – 2015-08-17 (×7): 50 mg via ORAL
  Filled 2015-08-14 (×7): qty 1

## 2015-08-14 MED ORDER — AMLODIPINE BESYLATE 10 MG PO TABS
10.0000 mg | ORAL_TABLET | Freq: Every day | ORAL | Status: DC
  Administered 2015-08-15 – 2015-08-16 (×2): 10 mg via ORAL
  Filled 2015-08-14 (×2): qty 1

## 2015-08-14 NOTE — Evaluation (Signed)
Physical Therapy Evaluation Patient Details Name: Seth [name] MRN: [id number] DOB: September 08, 1966 Today's Date: 08/14/2015   History of Present Illness  pt presents with Pathologic T4-5 fxs now s/p T1 - T7 Posterior Fusion with T4-5 Decompression and Microdiscectomy.    Clinical Impression  Pt very painful, but agreeable to mobility and PT.  Pt unable to achieve full standing, but was able to come to squat position x2.  Pt would benefit from CIR at D/C to maximize independence prior to returning to home.  Will continue to follow.      Follow Up Recommendations CIR    Equipment Recommendations  None recommended by PT    Recommendations for Other Services Rehab consult     Precautions / Restrictions Precautions Precautions: Fall;Back Precaution Booklet Issued: No Precaution Comments: Discussed back precautions for safety, but not formally ordered.   Restrictions Weight Bearing Restrictions: No      Mobility  Bed Mobility Overal bed mobility: Needs Assistance;+2 for physical assistance Bed Mobility: Rolling;Sidelying to Sit;Sit to Sidelying Rolling: Mod assist;+2 for physical assistance Sidelying to sit: Mod assist;+2 for physical assistance     Sit to sidelying: Max assist;+2 for physical assistance General bed mobility comments: cues for log roll technique and encouragement.  pt very painful with bed mobility.    Transfers Overall transfer level: Needs assistance Equipment used: 2 person hand held assist Transfers: Sit to/from Stand Sit to Stand: Max assist;+2 physical assistance;From elevated surface         General transfer comment: Attempted to come to stand x2 with height of bed elevated and pt only able to achieve squat position both times.  LEs blocked, but unable to extend Bil knees.    Ambulation/Gait                Stairs            Wheelchair Mobility    Modified Rankin (Stroke Patients Only)       Balance Overall balance assessment:  Needs assistance Sitting-balance support: Bilateral upper extremity supported;Feet supported Sitting balance-Leahy Scale: Fair Sitting balance - Comments: Feel UE A more related to pain than balance.   Standing balance support: Bilateral upper extremity supported;During functional activity Standing balance-Leahy Scale: Poor                               Pertinent Vitals/Pain Pain Assessment: 0-10 Pain Score: 9  Pain Location: Back and shoulders. Pain Descriptors / Indicators: Sore Pain Intervention(s): Monitored during session;Repositioned;Premedicated before session;Patient requesting pain meds-RN notified    Home Living Family/patient expects to be discharged to:: Inpatient rehab Living Arrangements: Alone                    Prior Function Level of Independence: Independent               Hand Dominance        Extremity/Trunk Assessment   Upper Extremity Assessment: Defer to OT evaluation           Lower Extremity Assessment: Generalized weakness      Cervical / Trunk Assessment: Kyphotic  Communication   Communication: No difficulties  Cognition Arousal/Alertness: Awake/alert Behavior During Therapy: WFL for tasks assessed/performed Overall Cognitive Status: Within Functional Limits for tasks assessed                      General Comments      Exercises  Assessment/Plan    PT Assessment Patient needs continued PT services  PT Diagnosis Difficulty walking;Acute pain;Generalized weakness   PT Problem List Decreased strength;Decreased activity tolerance;Decreased balance;Decreased mobility;Decreased knowledge of use of DME;Decreased knowledge of precautions;Obesity;Pain  PT Treatment Interventions DME instruction;Gait training;Functional mobility training;Therapeutic activities;Therapeutic exercise;Balance training;Neuromuscular re-education;Patient/family education   PT Goals (Current goals can be found in the  Care Plan section) Acute Rehab PT Goals Patient Stated Goal: Walk again PT Goal Formulation: With patient Time For Goal Achievement: 08/28/15 Potential to Achieve Goals: Good    Frequency Min 5X/week   Barriers to discharge        Co-evaluation               End of Session Equipment Utilized During Treatment: Gait belt Activity Tolerance: Patient limited by pain Patient left: in bed;with call bell/phone within reach Nurse Communication: Mobility status;Need for lift equipment         Time: [phone] PT Time Calculation (min) (ACUTE ONLY): 34 min   Charges:   PT Evaluation $Initial PT Evaluation Tier I: 1 Procedure PT Treatments $Therapeutic Activity: 8-22 mins   PT G CodesCatarina Hartshorn, [place] [phone] 08/14/2015, 11:13 AM

## 2015-08-14 NOTE — Progress Notes (Signed)
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 antiseptic oral rinse  7 mL Mouth Rinse QID  . aspirin  81 mg Oral Daily  . calcium acetate  1,334 mg Oral TID WC  .  ceFAZolin (ANCEF) IV  2 g Intravenous Q M,W,F-HD  . chlorhexidine gluconate  15 mL Mouth Rinse BID  . cholecalciferol  1,000 Units Oral Daily  . heparin  5,000 Units Subcutaneous 3 times per day  . insulin aspart  0-20 Units Subcutaneous 6 times per day  . pantoprazole  40 mg Oral QHS   Continuous Infusions: . sodium chloride 10 mL/hr at 08/14/15 0357  . norepinephrine (LEVOPHED) Adult infusion Stopped (08/13/15 0800)   PRN Meds:.acetaminophen, albuterol, fentaNYL (SUBLIMAZE) injection, oxyCODONE-acetaminophen  Current Labs: reviewed  B Cx x2 from 11/1: NGTD Surgical Cx 11/2: NGTD   Physical Exam:  Blood pressure 137/63, pulse 92, temperature 97.7 F (36.5 C), temperature source Oral, resp. rate 17, height 5\' 8"  (1.727 m), weight 138.9 kg (306 lb 3.5 oz), SpO2 100 %. GEN: NAD, obese, intubated ENT: NCAT EYES: EOMI CV: RRR, no rub PULM: CTAB ABD: s/nt/nd SKIN: No rashes/lesiosn EXT:No LEE  Outpt HD Orders Unit: [facility] Days: MWF Time: 4h Dialyzer: Elisio EDW: 138kg K/Ca: 2/2.5 Access: AVF, RFA Needle Size: 14g BFR/DFR: 500/800 VDRA: Hectorol 79mcg qTx EPO: Epogen 5000 units qTx Heparin: 4500 IU at start, then infusion 1000 IU/hr.  A/P 1. ESRD:  1. MWF [place] via AVF 2. Keep on schedule 3. Holding heparin  currently 2. Pathoogical Fracture at T4 and T5 1. S/p laminectomy 11/2 2. Likely OM, Cx pending in blood and at spine 3. On cefazolin, recent Stah Lugdunesis bacteremia 4. 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 Postoperative VDRF: now extubated 4. HTN/Vol: Aim for EDW, BP stable 5. Anemia: Hb at goal siwtch to aranesp as inpt 6. MBD: cont PhosLo, cinacaclet,  1. PTH 407 2. Phos mildly increased follow for now  [name] MD 08/14/2015, 8:56 AM   Recent Labs Lab 08/12/15 0737 08/12/15 0738  08/12/15 2159 08/13/15 0105 08/14/15 0424  NA 131*  --   < > 134* 135 134*  K 4.0  --   < > 4.0 4.3 4.6  CL 86*  --   --   --  97* 95*  CO2 30  --   --   --  27 26  GLUCOSE 219*  --   < > 193* 217* 157*  BUN 35*  --   --   --  22* 32*  CREATININE 9.72*  --   --   --  6.79* 9.24*  CALCIUM 8.5* 7.7*  --   --  7.9* 7.7*  PHOS 7.0*  --   --   --   --  7.0*  < > = values in this interval not displayed.  Recent Labs Lab 08/10/15 2217 08/12/15 0737  08/12/15 2053 08/12/15 2159 08/14/15 0424  WBC 13.5* 10.0  --   --   --  12.9*  NEUTROABS 10.7* 7.3  --   --   --   --   HGB 10.1* 9.6*  < > 9.5* 10.2* 9.3*  HCT 31.3* 30.0*  < > 28.0* 30.0* 28.4*  MCV 97.5 99.3  --   --   --  98.3  PLT 333 339  --   --   --  277  < > = values in this interval not displayed.

## 2015-08-14 NOTE — Care Management Important Message (Signed)
Important Message  Patient Details  Name: Seth [name] MRN: [id number] Date of Birth: 1966-06-06   Medicare Important Message Given:  Yes-second notification given    Nathen May 08/14/2015, 10:51 AM

## 2015-08-14 NOTE — Progress Notes (Signed)
Postop day 2.  Patient extubated doing well. Pain very well controlled. Patient states that his legs and chest feel much better. He is having the appropriate amount of incisional pain.  Afebrile. Vitals are stable. Drain output moderate. Awake and alert. Oriented and appropriate. Motor and sensory function intact. Dressing dry.  Progressing well. Cultures negative so far. Continue IV antibiotics. Mobilize with physical therapy. Due to patient's body habitus and location of his surgery there is no effective bracing option available. With that in mind we will forego bracing and mobilize without a brace.

## 2015-08-14 NOTE — Consult Note (Signed)
PULMONARY / CRITICAL CARE MEDICINE   Name: Seth [name] MRN: [id number] DOB: September 22, 1966    ADMISSION DATE:  08/10/2015 CONSULTATION DATE:  08/13/15  REFERRING MD :  Annette Stable  CHIEF COMPLAINT:  Vent management post op  INITIAL PRESENTATION: Seth [name] is a 49 y.o. M with PMH as outlined below.  He was admitted to [facility] 10/31 with weakness and tingling in his legs as well as back pain.  He had recent hospitalization (07/03/15 through 07/14/15) for staph lugdunensis and is s/p 2 weeks of cefazolin therapy (had negative TEE).  He had MRI that showed destructive changes at T4 - T5 concerning for discitis.  He was seen by neurosurgery in consultation and decision was made to take him to the OR for surgical debridement.  On evening of 11/2, he was taken to the OR and had T4 - T5 decompressive laminectomy with left T4 and T5 transpedicular corpetomy of T4 and T5 with microdissection, T1-T7 posterior lateral arthrodesis utilizing pedicle screw fixation and morselized allograft.  Following the procedure, he was extubated in PACU but required re-intubation.  He returned to the neuro ICU and PCCM was called for vent management.   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 MRI Tspine 11/1 >>> findings c/w discitis / osteo at T4-T5.  10/31 - admitted. 11/2 - taken to OR for decompressive laminectomy.  SUBJECTIVE:  08/14/15: extubated yesterday. Doing well. PEr RN - patient can transfer per neurosurg. Per renal Dr Joelyn Oms - patient can transfer out. He is hungry and wants to eat. Also having post op back pain - percocet x 1 in 12h  VITAL SIGNS: Temp:  [97.7 F (36.5 C)-100.3 F (37.9 C)] 97.7 F (36.5 C) (11/04 0752) Pulse Rate:  [55-101] 92 (11/04 0700) Resp:  [14-31] 17 (11/04 0700) BP: (95-148)/(51-77) 137/63 mmHg (11/04 0700) SpO2:  [87 %-100 %] 100 % (11/04 0750) Arterial  Line BP: (85-100)/(54-61) 97/58 mmHg (11/03 1100) FiO2 (%):  [40 %] 40 % (11/03 0936) HEMODYNAMICS:   VENTILATOR SETTINGS: Vent Mode:  [-] CPAP;PSV FiO2 (%):  [40 %] 40 % PEEP:  [5 cmH20] 5 cmH20 Pressure Support:  [5 cmH20] 5 cmH20 INTAKE / OUTPUT: Intake/Output      11/03 0701 - 11/04 0700 11/04 0701 - 11/05 0700   P.O. 400    I.V. (mL/kg) 1095.5 (7.9)    Blood     IV Piggyback 50    Total Intake(mL/kg) 1545.5 (11.1)    Urine (mL/kg/hr)     Drains 205 (0.1)    Other     Stool     Blood     Total Output 205     Net +1340.5            PHYSICAL EXAMINATION: General: Adult male, in NAD. Morbidly obese Neuro: RASS 0 to -1 post pain injection. Oriented x 3. Moves all 4s HEENT: [place]/AT. PERRL, sclerae anicteric. Cardiovascular: RRR, no M/R/G.  Lungs: Respirations even and unlabored.  CTA bilaterally, No W/R/R.  Abdomen: Obese, BS x 4, soft, NT/ND. Ventral hernia noted, easily reducible. Musculoskeletal: No gross deformities, no edema.  Skin: Intact, warm, no rashes.  Wound vac in place post op.  LABS:  CBC  Recent Labs Lab 08/10/15 2217 08/12/15 0737  08/12/15 2053 08/12/15 2159 08/14/15 0424  WBC 13.5* 10.0  --   --   --  12.9*  HGB 10.1* 9.6*  < >  9.5* 10.2* 9.3*  HCT 31.3* 30.0*  < > 28.0* 30.0* 28.4*  PLT 333 339  --   --   --  277  < > = values in this interval not displayed. Coag's No results for input(s): APTT, INR in the last 168 hours. BMET  Recent Labs Lab 08/12/15 0737  08/12/15 2159 08/13/15 0105 08/14/15 0424  NA 131*  < > 134* 135 134*  K 4.0  < > 4.0 4.3 4.6  CL 86*  --   --  97* 95*  CO2 30  --   --  27 26  BUN 35*  --   --  22* 32*  CREATININE 9.72*  --   --  6.79* 9.24*  GLUCOSE 219*  < > 193* 217* 157*  < > = values in this interval not displayed. Electrolytes  Recent Labs Lab 08/12/15 0737 08/12/15 0738 08/13/15 0105 08/14/15 0424  CALCIUM 8.5* 7.7* 7.9* 7.7*  MG  --   --   --  1.9  PHOS 7.0*  --   --  7.0*   Sepsis  Markers  Recent Labs Lab 08/10/15 2223  LATICACIDVEN 1.19   ABG  Recent Labs Lab 08/12/15 1915 08/13/15 0320  PHART 7.435 7.345*  PCO2ART 43.7 47.0*  PO2ART 316.0* 106*   Liver Enzymes  Recent Labs Lab 08/10/15 2217 08/12/15 0737  AST 14*  --   ALT 10*  --   ALKPHOS 99  --   BILITOT 0.5  --   ALBUMIN 3.8 3.1*   Cardiac Enzymes No results for input(s): TROPONINI, PROBNP in the last 168 hours. Glucose  Recent Labs Lab 08/13/15 1143 08/13/15 1540 08/13/15 2003 08/13/15 2356 08/14/15 0343 08/14/15 0750  GLUCAP 130* 156* 164* 160* 156* 166*    Imaging Portable Chest Xray  08/14/2015  CLINICAL DATA:  Respiratory failure EXAM: PORTABLE CHEST 1 VIEW COMPARISON:  July 09, 2015 FINDINGS: There is mild atelectatic change in the right base. The lungs elsewhere clear. Heart is upper normal in size with pulmonary vascularity within normal limits. Central catheter tip is in the superior vena cava, just beyond the junction with the left innominate vein. No pneumothorax. There is postoperative change in the thoracic spine with multiple levels of pedicle screw and plate fixation. IMPRESSION: Mild atelectasis right lower lobe. Lungs elsewhere clear. Central catheter as described without pneumothorax. Heart upper normal in size. Electronically Signed   By: Lowella Grip III M.D.   On: 08/14/2015 07:51     ASSESSMENT / PLAN:  NEUROLOGIC A:   #Baseline  =- chronic back pain on ultram, naprizn, neurontin  #ADmit Acute encephalopathy due to sedation. 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   0- having occ. Post op pain needing opioids. P:   Dc fent gtt and versed prn retsart home scheduled ultram Hold home gabapentin and flexeril for now - slowly restart based on need Low dose fent prn for severe pain + percocet prn - care with this in setting of OSA RASS goal: 0 to -1. Daily WUA. Neurosurgery  following.   PULMONARY OETT 11/2 >>>08/13/15 A: VDRF following surgery 11/2. Hx OSA / probable OHS - uses CPAP nocturnally.   - doing well post extubation P:   CPAP nocturnally  Albuterol PRN.   CARDIOVASCULAR CVL L IJ 11/2 >>> A:  Hx HTN.   0 postop hypotension due to diprivan resolved. Now SBP 140  P:  restar home amlodipine.  RENAL  A:   ESRD (M/W/F). Hyponatremia - chronic. Pseudohypocalcemia - corrects to 9.22.   P:   Nephrology following. Marland Kitchen  GASTROINTESTINAL A:   GI prophylaxis. Nutrition. P:   SUP: Pantoprazole. Renal, carb modified , DM diet.  HEMATOLOGIC A:   Anemia of chronic disease. VTE Prophylaxis. P:  Transfuse for Hgb < 7. SCD's / Heparin. CBC in AM.  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 Hx staph lugdunensis with negative TEE on prior admission (07/03/15 through 07/14/15). P:   BCx2 11/1 > Wound Cx 11/2 >>> Abx: Cefazolin, start date 11/2 >>  (? Stop date, ? neurosurg managing)  ENDOCRINE A:   DM.   P:   SSI.   Family updated: None. Patient updated 08/14/2015   Interdisciplinary Family Meeting v Palliative Care Meeting:  Due by: 11/9.   GLOBAL Move to neuro tele. TRH primary from 08/15/15 and PCCM off - d/w Dr Sherral Hammers    Dr. Brand Males, M.D., [facility].C.P Pulmonary and Critical Care Medicine Staff Physician [facility] Pager: [phone], If no answer or between  15:00h - 7:00h: call [phone]  08/14/2015 8:48 AM

## 2015-08-14 NOTE — Progress Notes (Signed)
ANTIBIOTIC CONSULT NOTE -  Follow up  Pharmacy Consult for cefzolin Indication: osteomyelitis   No Known Allergies  Patient Measurements: Height: 5\' 8"  (172.7 cm) Weight: (!) 306 lb 3.5 oz (138.9 kg) IBW/kg (Calculated) : 68.4   Vital Signs: Temp: 97.7 F (36.5 C) (11/04 0752) Temp Source: Oral (11/04 0752) BP: 137/63 mmHg (11/04 0700) Pulse Rate: 92 (11/04 0700) Intake/Output from previous day: 11/03 0701 - 11/04 0700 In: 1545.5 [P.O.:400; I.V.:1095.5; IV Piggyback:50] Out: 205 [Drains:205] Intake/Output from this shift:    Labs:  Recent Labs  08/12/15 0737  08/12/15 2053 08/12/15 2159 08/13/15 0105 08/14/15 0424  WBC 10.0  --   --   --   --  12.9*  HGB 9.6*  < > 9.5* 10.2*  --  9.3*  PLT 339  --   --   --   --  277  CREATININE 9.72*  --   --   --  6.79* 9.24*  < > = values in this interval not displayed. *esimtated CrCl: < 10 ml/min  Microbiology: CX data:  9/24: BCx: Staph lugdunensis  11/1 blood: ngtd  11/1: op cx: px   ABX:  11/2 Ancef <<   Assessment: 49 yo male with ESRD and recent staph lugdunesis bacteremia, s/p 4 weeks of treatment, now initiating abx for osteomyelitis discitis. Pt underwent I&D and decompressive laminectomy at T4-T5 on 11/2.Marland Kitchen    Goal of Therapy:  Resolution of infection   Plan:  -Cefazolin 2 g IV post IHD (currently MWF schedule) -Monitor cultures,  Any changes HD schedule   Vincenza Hews, PharmD, BCPS 08/14/2015, 8:15 AM Pager: [phone]

## 2015-08-14 NOTE — Progress Notes (Signed)
[place] Progress Note Patient Name: Seth [name] DOB: 1966/07/18 MRN: [id number]   Date of Service  08/14/2015  HPI/Events of Note  RN contacted regarding pain control post-op. Pt extubated & on camera chk sleeping comfortably on NIPPV.   eICU Interventions  Percocet 5-325 1tab po q6hr prn & tylenol prn.     Intervention Category Intermediate Interventions: Pain - evaluation and management  Tera Partridge 08/14/2015, 6:27 AM

## 2015-08-14 NOTE — Progress Notes (Signed)
Pt transferred to 6e13 by RN and nurse tech. Pt A&Ox4 and states no pain at this time. Skin was assessed with Amado Coe, RN; honeycomb dressing to upper back, dressing is dry and intact with old drainage noted. Tele box 6e13 applied and CCMD notified. Orders have been reviewed and implemented. Patient oriented to unit and unit staff, call light within reach. Will continue to monitor.  Shelbie Hutching, RN, BSN

## 2015-08-14 NOTE — Progress Notes (Signed)
170ml Fentanyl gtt wasted in sink. Waste verified by charge RN Otho Najjar, RN.

## 2015-08-14 NOTE — Consult Note (Signed)
Physical Medicine and Rehabilitation Consult   Reason for Consult: Osteomyelitis/diskitis T4 and T5 with pathologic fracture and cord compression Referring Physician: Dr. Chase Caller   HPI: Seth [name] is a 49 y.o. male with history of DM type 2, ESRD, OSA, recent  Staph lugdunensis treated with 2 weeks of IV antibiotics. He was admitted on 08/10/15 with three week history of upper back pain progressing to BLE weakness with tingling and difficulty walking. MRI thoracic spine done revealing discitis-osteomyelitis at T4-5 with endplate destructive changes and vertebral body height loss and retropulsion of inferior margin of T4 and superior margin of T5 into spinal canal with moderate stenosis and cord compression. Patient was taken to OR on 11/02 for T4-T5 decompressive laminectomy with T4 and T5 transpedicular corpectomy and arthrodesis by Dr. Annette Stable. Did not tolerate extubation in PACU requiring re-intubation and  placed on Cefzolin for 6 weeks IV therapy per ID input.  He tolerated extubation on 11/03 and PT evaluation done this am. Pt has weakness in his RLE post-op.    Review of Systems  HENT: Negative for hearing loss.   Eyes: Negative for blurred vision and double vision.  Respiratory: Positive for cough. Negative for sputum production.   Cardiovascular: Negative for chest pain and palpitations.  Gastrointestinal: Negative for heartburn, nausea, abdominal pain and constipation.  Musculoskeletal: Positive for myalgias and back pain.  Skin: Negative for itching and rash.  Neurological: Negative for dizziness, tingling, speech change and headaches.  Psychiatric/Behavioral: Negative for depression and memory loss.  All other systems reviewed and are negative.     Past Medical History  Diagnosis Date  . Hypertension   . ESRD (end stage renal disease) on dialysis ([place])   . 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 Sleep apnea     USES CPAP  . Diabetes mellitus     INSULIN DEPENDENT DIABETES  . Discitis 07/2015   Past Surgical History  Procedure Laterality Date  . Av fistula placement  05/03/2012    Procedure: ARTERIOVENOUS (AV) FISTULA CREATION;  Surgeon: Mal [name], MD;  Location: [place];  Service: Vascular;  Laterality: Right;  . Tracheostomy tube placement N/A 08/20/2013    Procedure: TRACHEOSTOMY Revision;  Surgeon: Melida Quitter, MD;  Location: [place];  Service: ENT;  Laterality: N/A;  . Tee without cardioversion N/A 07/10/2015    Procedure: TRANSESOPHAGEAL ECHOCARDIOGRAM (TEE);  Surgeon: Josue Hector, MD;  Location: [facility] ENDOSCOPY;  Service: Cardiovascular;  Laterality: N/A;   Family History  Problem Relation Age of Onset  . Diabetes Mother   . Diabetes Sister   . Diabetes Brother     Social History:  Lives with sister and sons.  He states he has 24/7 support at discharge. 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 Was independent without AD prior to last admission.  Was using a wheelchair for a week PTA.  He reports that he has never smoked. He has never used smokeless tobacco. Per reports that he drinks about 0.6 oz of alcohol per week. Per reports that he uses illicit drugs (Marijuana).    Allergies: No Known Allergies    Medications Prior to Admission  Medication Sig Dispense Refill  . amLODipine (NORVASC) 10 MG tablet Take 1 tablet (10 mg total) by mouth daily. 30 tablet 0  . aspirin EC 81 MG tablet Take 81 mg by mouth daily.    . calcium  acetate (PHOSLO) 667 MG capsule Take 2 capsules (1,334 mg total) by mouth 3 (three) times daily with meals. 180 capsule 0  . Cholecalciferol (VITAMIN D PO) Take 1 tablet by mouth daily.    . cyclobenzaprine (FLEXERIL) 10 MG tablet Take 1 tablet (10 mg total) by mouth 3 (three) times daily as needed for muscle spasms. 42 tablet 0  . insulin aspart (NOVOLOG) 100 UNIT/ML injection Inject 0-15 Units into the skin 3 (three) times daily with meals. CBG < 70:  drink orange juice and recheck glucose or use glucose tabs CBG > 400: call MD   CBG 70 - 120: 0 units CBG 121 - 150: 3 units CBG 151 - 200: 4 units CBG 201 - 250: 7 units CBG 251 - 300: 11 units CBG 301 - 350: 15 units CBG 351 - 400: 20 units 1 vial 12  . albuterol (PROVENTIL) (2.5 MG/3ML) 0.083% nebulizer solution Take 3 mLs (2.5 mg total) by nebulization every 6 (six) hours as needed for wheezing or shortness of breath. (Patient not taking: Reported on 07/17/2015) 75 mL 12  . ceFAZolin (ANCEF) 2-3 GM-% SOLR Inject 50 mLs (2 g total) into the vein every Monday, Wednesday, and Friday with hemodialysis. (Patient not taking: Reported on 08/05/2015)    . Darbepoetin Alfa (ARANESP) 150 MCG/0.3ML SOSY injection Inject 0.3 mLs (150 mcg total) into the vein every Monday with hemodialysis. 1.68 mL 0  . diazepam (VALIUM) 2 MG tablet Take 1 tablet (2 mg total) by mouth every 8 (eight) hours as needed for muscle spasms. (Patient not taking: Reported on 07/17/2015) 30 tablet 0  . gabapentin (NEURONTIN) 300 MG capsule Take 1 capsule (300 mg total) by mouth 3 (three) times daily. 90 capsule 0  . naproxen (NAPROSYN) 500 MG tablet Take 1 tablet (500 mg total) by mouth 2 (two) times daily with a meal. (Patient not taking: Reported on 08/05/2015) 10 tablet 0  . predniSONE (DELTASONE) 20 MG tablet Take 2 tablets (40 mg total) by mouth daily. (Patient not taking: Reported on 07/17/2015) 10 tablet 0  . traMADol (ULTRAM) 50 MG tablet Take 1 tablet (50 mg total) by mouth every 6 (six) hours as needed. For back pain (Patient not taking: Reported on 08/05/2015) 15 tablet 0  . Vitamin D, Ergocalciferol, (DRISDOL) 50000 UNITS CAPS capsule Take 1 capsule (50,000 Units total) by mouth every 7 (seven) days. *takes on Friday* 30 capsule 0    Home: Home Living Family/patient expects to be discharged to:: Inpatient rehab Living Arrangements: Alone  Functional History: Prior Function Level of Independence:  Independent Functional Status:  Mobility: Bed Mobility Overal bed mobility: Needs Assistance, +2 for physical assistance Bed Mobility: Rolling, Sidelying to Sit, Sit to Sidelying Rolling: Mod assist, +2 for physical assistance Sidelying to sit: Mod assist, +2 for physical assistance Sit to sidelying: Max assist, +2 for physical assistance General bed mobility comments: cues for log roll technique and encouragement.  pt very painful with bed mobility.   Transfers Overall transfer level: Needs assistance Equipment used: 2 person hand held assist Transfers: Sit to/from Stand Sit to Stand: Max assist, +2 physical assistance, From elevated surface General transfer comment: Attempted to come to stand x2 with height of bed elevated and pt only able to achieve squat position both times.  LEs blocked, but unable to extend Bil knees.        ADL:    Cognition: Cognition Overall Cognitive Status: Within Functional Limits for tasks assessed Orientation Level: Oriented X4 Cognition Arousal/Alertness:  Awake/alert Behavior During Therapy: WFL for tasks assessed/performed Overall Cognitive Status: Within Functional Limits for tasks assessed  Blood pressure 134/68, pulse 96, temperature 98.6 F (37 C), temperature source Oral, resp. rate 15, height 5\' 8"  (1.727 m), weight 138.9 kg (306 lb 3.5 oz), SpO2 96 %. Physical Exam  Nursing note and vitals reviewed. Constitutional: He is oriented to person, place, and time. He appears well-developed and well-nourished. Nasal cannula in place.  Morbidly obese male lying in bed. NAD.  HENT:  Head: Normocephalic and atraumatic.  Eyes: Conjunctivae and EOM are normal. Pupils are equal, round, and reactive to light.  Neck: Normal range of motion. Neck supple.  Cardiovascular: Normal rate and regular rhythm.   No murmur heard. Respiratory: Effort normal. No respiratory distress. He has no wheezes.  Upper airway sounds  GI: Soft. Bowel sounds are normal.  He exhibits no distension. There is no tenderness.  Musculoskeletal: He exhibits edema. He exhibits no tenderness.  Strength B/l UE 4+/5 throughout LLE hip flexion 2+/5, ankle dorsi/plantar flexion 4/5 RLE: hip flexion 2-/5, ankle dorsi/plantar flexion 3/5  Neurological: He is alert and oriented to person, place, and time. He has normal reflexes.  Intentional tremors BUE. Sensation intact to light touch   Skin: Skin is warm and dry. No rash noted. No erythema.  Psychiatric: He has a normal mood and affect. His behavior is normal. Thought content normal.    Results for orders placed or performed during the hospital encounter of 08/10/15 (from the past 24 hour(s))  Glucose, capillary     Status: Abnormal   Collection Time: 08/13/15  3:40 PM  Result Value Ref Range   Glucose-Capillary 156 (H) 65 - 99 mg/dL  Glucose, capillary     Status: Abnormal   Collection Time: 08/13/15  8:03 PM  Result Value Ref Range   Glucose-Capillary 164 (H) 65 - 99 mg/dL   Comment 1 Notify RN   Glucose, capillary     Status: Abnormal   Collection Time: 08/13/15 11:56 PM  Result Value Ref Range   Glucose-Capillary 160 (H) 65 - 99 mg/dL   Comment 1 Notify RN   Glucose, capillary     Status: Abnormal   Collection Time: 08/14/15  3:43 AM  Result Value Ref Range   Glucose-Capillary 156 (H) 65 - 99 mg/dL   Comment 1 Notify RN   CBC     Status: Abnormal   Collection Time: 08/14/15  4:24 AM  Result Value Ref Range   WBC 12.9 (H) 4.0 - 10.5 K/uL   RBC 2.89 (L) 4.22 - 5.81 MIL/uL   Hemoglobin 9.3 (L) 13.0 - 17.0 g/dL   HCT 28.4 (L) 39.0 - 52.0 %   MCV 98.3 78.0 - 100.0 fL   MCH 32.2 26.0 - 34.0 pg   MCHC 32.7 30.0 - 36.0 g/dL   RDW 16.6 (H) 11.5 - 15.5 %   Platelets 277 150 - 400 K/uL  Magnesium     Status: None   Collection Time: 08/14/15  4:24 AM  Result Value Ref Range   Magnesium 1.9 1.7 - 2.4 mg/dL  Phosphorus     Status: Abnormal   Collection Time: 08/14/15  4:24 AM  Result Value Ref Range    Phosphorus 7.0 (H) 2.5 - 4.6 mg/dL  Basic metabolic panel     Status: Abnormal   Collection Time: 08/14/15  4:24 AM  Result Value Ref Range   Sodium 134 (L) 135 - 145 mmol/L   Potassium 4.6 3.5 -  5.1 mmol/L   Chloride 95 (L) 101 - 111 mmol/L   CO2 26 22 - 32 mmol/L   Glucose, Bld 157 (H) 65 - 99 mg/dL   BUN 32 (H) 6 - 20 mg/dL   Creatinine, Ser 9.24 (H) 0.61 - 1.24 mg/dL   Calcium 7.7 (L) 8.9 - 10.3 mg/dL   GFR calc non Af Amer 6 (L) >60 mL/min   GFR calc Af Amer 7 (L) >60 mL/min   Anion gap 13 5 - 15  Glucose, capillary     Status: Abnormal   Collection Time: 08/14/15  7:50 AM  Result Value Ref Range   Glucose-Capillary 166 (H) 65 - 99 mg/dL  Glucose, capillary     Status: Abnormal   Collection Time: 08/14/15 11:19 AM  Result Value Ref Range   Glucose-Capillary 221 (H) 65 - 99 mg/dL   Dg Thoracic Spine 1 View  08/12/2015  CLINICAL DATA:  49 year old with discitis/osteomyelitis at T4-5. Debridement and fusion. EXAM: OPERATIVE THORACIC SPINE 1 VIEW(S) COMPARISON:  MRI thoracic spine 08/11/2015 and CT thoracic spine 08/10/2015. FINDINGS: Two AP spot images of the thoracic spine were obtained with the C-arm fluoroscopic device and submitted for interpretation postoperatively. These demonstrate bilateral pedicle screws at T1, T2, T3, T6 and T7. Osteolysis of T4 and T5 again noted. The radiologic technologist documented 39 sec of fluoroscopy time. IMPRESSION: Images obtained during debridement of discitis/osteomyelitis at T4-5 and thoracic spine fusion. Electronically Signed   By: Evangeline Dakin M.D.   On: 08/12/2015 22:51   Portable Chest Xray  08/14/2015  CLINICAL DATA:  Respiratory failure EXAM: PORTABLE CHEST 1 VIEW COMPARISON:  July 09, 2015 FINDINGS: There is mild atelectatic change in the right base. The lungs elsewhere clear. Heart is upper normal in size with pulmonary vascularity within normal limits. Central catheter tip is in the superior vena cava, just beyond the junction  with the left innominate vein. No pneumothorax. There is postoperative change in the thoracic spine with multiple levels of pedicle screw and plate fixation. IMPRESSION: Mild atelectasis right lower lobe. Lungs elsewhere clear. Central catheter as described without pneumothorax. Heart upper normal in size. Electronically Signed   By: Lowella Grip III M.D.   On: 08/14/2015 07:51   Dg C-arm 61-120 Min  08/12/2015  CLINICAL DATA:  49 year old with discitis/osteomyelitis at T4-5. Debridement and fusion. EXAM: OPERATIVE THORACIC SPINE 1 VIEW(S) COMPARISON:  MRI thoracic spine 08/11/2015 and CT thoracic spine 08/10/2015. FINDINGS: Two AP spot images of the thoracic spine were obtained with the C-arm fluoroscopic device and submitted for interpretation postoperatively. These demonstrate bilateral pedicle screws at T1, T2, T3, T6 and T7. Osteolysis of T4 and T5 again noted. The radiologic technologist documented 39 sec of fluoroscopy time. IMPRESSION: Images obtained during debridement of discitis/osteomyelitis at T4-5 and thoracic spine fusion. Electronically Signed   By: Evangeline Dakin M.D.   On: 08/12/2015 22:51    Assessment/Plan: Diagnosis: Cord compression secondary to Osteomyelitis/diskitis T4 and T5 with pathologic fracture Labs and images independently reviewed.  Records reviewed and summated above.     Respiratory: encourage early use of incentive spirometry as tolerated, assisted cough and deep breathing techniques. Chest physiotherapy if no contraindications. May consider use of abdominal binder for better     diaphragmatic excursion.      Skin: daily skin checks, turn q2 (care with the spine), PRAFO, continue use pressure relieving mattress      Cardiovascular: anticipate orthostasis when OOB. May use abdominal binder, TEDs or ace wraps  to BLE for this. If ineffective, consider salt tabs, midodrine or fludrocortisone.       Extremities:. pt is at risk for flexion contractures, especially the  hip, also at risk for heterotrophic ossification. Continue ROM.     Psych: psychology consult for adjustment to disability for pt and family     Mobility: PT and OT evaluation for mobility, ADLS, strengthening, and wheel chair training     Electrolyte: at risk for immobilization hypercalcemia, monitor labs.     Pain Management:  control with oral medications if possible  1. Does the need for close, 24 hr/day medical supervision in concert with the patient's rehab needs make it unreasonable for this patient to be served in a less intensive setting? Yes  2. Co-Morbidities requiring supervision/potential complications: ESRD on HD (Follow endurance and strength changes surrounding dialysis schedule and work with nephrology to optimize fluid status), DM (Monitor in accordance with exercise and adjust meds as necessary), HTN (monitor and provide prns in accordance with increased physical exertion and pain), bacteremia (cont to follow labs), post-op pain (attempt to manage with oral meds), leukocytosis (cont to follow labs), ABLA (transfuse if necessary to ensure appropriate perfusion for increased activity tolerance), morbid obesity (encourage weight loss to increase endurance and promote overall health) 3. Due to safety, skin/wound care, disease management, medication administration, pain management and patient education, does the patient require 24 hr/day rehab nursing? Yes 4. Does the patient require coordinated care of a physician, rehab nurse, PT (1.5-2 hrs/day, 5 days/week) and OT (1.5-2 hrs/day, 5 days/week) to address physical and functional deficits in the context of the above medical diagnosis(es)? Yes Addressing deficits in the following areas: balance, endurance, locomotion, strength, transferring, bathing, dressing, grooming, toileting and psychosocial support 5. Can the patient actively participate in an intensive therapy program of at least 3 hrs of therapy per day at least 5 days per week?  Yes 6. The potential for patient to make measurable gains while on inpatient rehab is good 7. Anticipated functional outcomes upon discharge from inpatient rehab are modified independent, supervision and min assist  with PT, modified independent, supervision and min assist with OT, n/a with SLP. 8. Estimated rehab length of stay to reach the above functional goals is: 16-19 days.  9. Does the patient have adequate social supports and living environment to accommodate these discharge functional goals? Yes 10. Anticipated D/C setting: Home 11. Anticipated post D/C treatments: HH therapy and Home excercise program 12. Overall Rehab/Functional Prognosis: good  RECOMMENDATIONS: This patient's condition is appropriate for continued rehabilitative care in the following setting: CIR after medical workup completed (including TTE). Patient has agreed to participate in recommended program. Yes Note that insurance prior authorization may be required for reimbursement for recommended care.  Comment: Rehab Admissions Coordinator to follow up.  Delice Lesch, MD 08/14/2015

## 2015-08-14 NOTE — Progress Notes (Signed)
Rehab Admissions Coordinator Note:  Patient was screened by Retta Diones for appropriateness for an Inpatient Acute Rehab Consult.  At this time, we are recommending Inpatient Rehab consult.  Retta Diones 08/14/2015, 1:25 PM  I can be reached at [phone].

## 2015-08-14 NOTE — Progress Notes (Signed)
    [facility]   Reason for visit: Follow up on discitis  Interval History: he had surgical debridement with T4-5 decompressive laminectomy, transpedicular corpectomy.  He is on cefazolin based on previous Staph lugdunensis.    Physical Exam: Constitutional:  Filed Vitals:   08/14/15 1120  BP:   Pulse:   Temp: 98.6 F (37 C)  Resp:    patient appears in NAD Eyes: anicteric HENT: no thrush Respiratory: Normal respiratory effort; CTA B Cardiovascular: RRR  Review of Systems: Constitutional: negative for fevers and chills Gastrointestinal: negative for diarrhea  Lab Results  Component Value Date   WBC 12.9* 08/14/2015   HGB 9.3* 08/14/2015   HCT 28.4* 08/14/2015   MCV 98.3 08/14/2015   PLT 277 08/14/2015    Lab Results  Component Value Date   CREATININE 9.24* 08/14/2015   BUN 32* 08/14/2015   NA 134* 08/14/2015   K 4.6 08/14/2015   CL 95* 08/14/2015   CO2 26 08/14/2015    Lab Results  Component Value Date   ALT 10* 08/10/2015   AST 14* 08/10/2015   ALKPHOS 99 08/10/2015     Microbiology: Recent Results (from the past 240 hour(s))  Blood culture (routine x 2)     Status: None (Preliminary result)   Collection Time: 08/11/15  2:45 AM  Result Value Ref Range Status   Specimen Description BLOOD LEFT ARM  Final   Special Requests BOTTLES DRAWN AEROBIC AND ANAEROBIC 5CC  Final   Culture NO GROWTH 2 DAYS  Final   Report Status PENDING  Incomplete  Blood culture (routine x 2)     Status: None (Preliminary result)   Collection Time: 08/11/15  8:27 AM  Result Value Ref Range Status   Specimen Description BLOOD LEFT HAND  Final   Special Requests BOTTLES DRAWN AEROBIC ONLY  5CC  Final   Culture NO GROWTH 2 DAYS  Final   Report Status PENDING  Incomplete  Culture, routine-abscess     Status: None (Preliminary result)   Collection Time: 08/12/15  8:59 PM  Result Value Ref Range Status   Specimen Description ABSCESS  Final   Special  Requests THORACIC SPINE  Final   Gram Stain   Final    NO WBC SEEN NO SQUAMOUS EPITHELIAL CELLS SEEN NO ORGANISMS SEEN Performed at [facility]    Culture   Final    NO GROWTH 1 DAY Performed at [facility]    Report Status PENDING  Incomplete  MRSA PCR Screening     Status: None   Collection Time: 08/13/15 12:50 AM  Result Value Ref Range Status   MRSA by PCR NEGATIVE NEGATIVE Final    Comment:        The GeneXpert MRSA Assay (FDA approved for NASAL specimens only), is one component of a comprehensive MRSA colonization surveillance program. It is not intended to diagnose MRSA infection nor to guide or monitor treatment for MRSA infections.     Impression:  1. T4-5 discitis 2.  DDD  Plan: 1. Cefazolin for 6 weeks with HD 2. Repeat TTE to be sure no notable vegetation

## 2015-08-15 LAB — GLUCOSE, CAPILLARY
GLUCOSE-CAPILLARY: 165 mg/dL — AB (ref 65–99)
GLUCOSE-CAPILLARY: 172 mg/dL — AB (ref 65–99)
Glucose-Capillary: 162 mg/dL — ABNORMAL HIGH (ref 65–99)
Glucose-Capillary: 165 mg/dL — ABNORMAL HIGH (ref 65–99)
Glucose-Capillary: 174 mg/dL — ABNORMAL HIGH (ref 65–99)
Glucose-Capillary: 86 mg/dL (ref 65–99)

## 2015-08-15 NOTE — Procedures (Signed)
Pt placed on CPAP without complications.  Pt resting comfortably and resting well.

## 2015-08-15 NOTE — Progress Notes (Signed)
   08/15/15 0110  BiPAP/CPAP/SIPAP  BiPAP/CPAP/SIPAP Pt Type Adult  Mask Type Nasal mask  Mask Size Medium  Respiratory Rate 17 breaths/min  IPAP 10 cmH20  EPAP 10 cmH2O  Oxygen Percent 32 %  Flow Rate 3 lpm  BiPAP/CPAP/SIPAP CPAP  Patient Home Equipment No

## 2015-08-15 NOTE — Progress Notes (Signed)
No acute events AVSS Motor 5/5 BLE Incision c/d/i with bandage Drain in place (170 yesterday) Stable Keep drain at least one more day No new recs

## 2015-08-15 NOTE — Progress Notes (Signed)
Rehab admissions - Please see rehab consult done by Dr. Posey Pronto yesterday.  I will follow progress over weekend and check back on Monday.  Call me for questions.  RC:[phone]

## 2015-08-15 NOTE — Progress Notes (Signed)
Admit: 08/10/2015 LOS: 4  37M ESRD MWF with T4-T5 pathological fracture presumed OM s/p surgical correction 08/12/15  Subjective:  HD yesterday, partial treatment 2/2 infiltration at venous site No new events Need TTE To CIR?    11/04 0701 - 11/05 0700 In: 330 [P.O.:280] Out: 1870 [Drains:170]  Filed Weights   08/14/15 1500 08/14/15 1730 08/14/15 2032  Weight: 142.5 kg (314 lb 2.5 oz) 140.8 kg (310 lb 6.5 oz) 140.2 kg (309 lb 1.4 oz)    Scheduled Meds: . amLODipine  10 mg Oral Daily  . antiseptic oral rinse  7 mL Mouth Rinse QID  . aspirin  81 mg Oral Daily  . calcium acetate  1,334 mg Oral TID WC  .  ceFAZolin (ANCEF) IV  2 g Intravenous Q M,W,F-HD  . chlorhexidine gluconate  15 mL Mouth Rinse BID  . cholecalciferol  1,000 Units Oral Daily  . heparin  5,000 Units Subcutaneous 3 times per day  . insulin aspart  0-20 Units Subcutaneous 6 times per day  . pantoprazole  40 mg Oral QHS  . traMADol  50 mg Oral Q12H   Continuous Infusions:   PRN Meds:.acetaminophen, albuterol, fentaNYL (SUBLIMAZE) injection, oxyCODONE-acetaminophen  Current Labs: reviewed  B Cx x2 from 11/1: NGTD Surgical Cx 11/2: NGTD   Physical Exam:  Blood pressure 131/64, pulse 89, temperature 99 F (37.2 C), temperature source Oral, resp. rate 20, height 5\' 8"  (1.727 m), weight 140.2 kg (309 lb 1.4 oz), SpO2 100 %. GEN: NAD, obese, intubated ENT: NCAT EYES: EOMI CV: RRR, no rub PULM: CTAB ABD: s/nt/nd SKIN: No rashes/lesiosn EXT:No LEE RUE with mild edema, stable  Outpt HD Orders Unit: [facility] Days: MWF Time: 4h Dialyzer: Elisio EDW: 138kg K/Ca: 2/2.5 Access: AVF, RFA Needle Size: 14g BFR/DFR: 500/800 VDRA: Hectorol 62mcg qTx EPO: Epogen 5000 units qTx Heparin: 4500 IU at start, then infusion 1000 IU/hr.  A/P 1. ESRD:  1. MWF [place] via AVF 2. 11/4 Tx curtailed 2/2 infiltration; electrolytes have been stable, volume ok; let AVF rest  until Monday  3. Holding heparin currently 2. Pathogical Fracture at T4 and T5 1. S/p laminectomy 11/2 2. Likely OM, Cx NGTD in blood and at spine 3. On cefazolin, recent Stah Lugdunesis bacteremia 4. 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 Postoperative VDRF: now extubated 4. HTN/Vol: Aim for EDW, BP stable 5. Anemia: Hb at goal siwtch to aranesp as inpt 6. MBD: cont PhosLo, cinacaclet,  1. PTH 407 2. Phos mildly increased follow for now  [name] MD 08/15/2015, 8:17 AM   Recent Labs Lab 08/12/15 0737 08/12/15 0738  08/12/15 2159 08/13/15 0105 08/14/15 0424  NA 131*  --   < > 134* 135 134*  K 4.0  --   < > 4.0 4.3 4.6  CL 86*  --   --   --  97* 95*  CO2 30  --   --   --  27 26  GLUCOSE 219*  --   < > 193* 217* 157*  BUN 35*  --   --   --  22* 32*  CREATININE 9.72*  --   --   --  6.79* 9.24*  CALCIUM 8.5* 7.7*  --   --  7.9* 7.7*  PHOS 7.0*  --   --   --   --  7.0*  < > = values in this interval not displayed.  Recent Labs Lab 08/10/15 2217 08/12/15  [id number]  08/12/15 2053 08/12/15 2159 08/14/15 0424  WBC 13.5* 10.0  --   --   --  12.9*  NEUTROABS 10.7* 7.3  --   --   --   --   HGB 10.1* 9.6*  < > 9.5* 10.2* 9.3*  HCT 31.3* 30.0*  < > 28.0* 30.0* 28.4*  MCV 97.5 99.3  --   --   --  98.3  PLT 333 339  --   --   --  277  < > = values in this interval not displayed.

## 2015-08-15 NOTE — Progress Notes (Signed)
   Transfer Note from ICU Patient post-op day #3 s/p T4-T5 decompressive laminectomy for desctructive changes at T4-T5 level concerning for osteomyelitis on 11/2. Extubated after procedure, but required re-intubation and admitted to Neuro ICU and followed by PCCM for vent management. Extubated on 11/3 and transferred to floor last night.   Subjective: Patient feels well, says pain is controlled. Denies fever, urinary or bowel incontinence. Objective: Vital signs in last 24 hours: Filed Vitals:   08/14/15 1730 08/14/15 2032 08/15/15 0410 08/15/15 0731  BP: 138/78 120/69 131/64 127/54  Pulse: 96 100 89 91  Temp: 98.1 F (36.7 C) 98 F (36.7 C) 99 F (37.2 C) 98.1 F (36.7 C)  TempSrc: Oral Oral Oral Axillary  Resp: 17 20 20 18   Height:      Weight: 310 lb 6.5 oz (140.8 kg) 309 lb 1.4 oz (140.2 kg)    SpO2: 100% 100% 100% 90%   Weight change:   Intake/Output Summary (Last 24 hours) at 08/15/15 1000 Last data filed at 08/15/15 [id number]  Gross per 24 hour  Intake    110 ml  Output   1800 ml  Net  -1690 ml   General: resting in bed Cardiac: RRR, no rubs, murmurs or gallops Pulm: clear to auscultation anteriorly, difficult to assess due to body habitus and limited mobility Abd: soft, nontender, nondistended, BS present Ext: warm and well perfused Neuro: alert and oriented X3  Assessment/Plan: Principal Problem:   Acute respiratory failure with hypoxia ([name]) Active Problems:   Cord compression ([name])   Back pain   Septic shock ([name])   Discitis of thoracic region  T4-T5 discitis/osteomyleitis: Post op day #3, s/p T4-T% decompressive laminectomy, transpedicular corpectomy. Doing well, pain under control. CIR recommended once medically stable and TTE completed. On cefazolin based on prior Staph lugdunensis bacteremia. -Neurosurgery following, appreciate recommendations -Continue Cefazolin for 6 weeks course with HD sessions. Start date 11/3, course to be completed on  12/15 -Appreciate recommendations from Infectious Disease -f/u TTE -Continue pain management -CIR after TTE and when medically stable  ESRD on HD: MWF dialysis, HD yesterday -Nephrology following, appreciate recommendations  HTN: Amlodipine 10 mg daily  T2DM: SSI-resistant   Dispo: Disposition is deferred at this time, awaiting improvement of current medical problems. CIR recommended when bed available and medically stable.   The patient does have a current PCP (Zada Finders, MD) and does need an [facility] hospital follow-up appointment after discharge.     LOS: 4 days   Zada Finders, MD 08/15/2015, 10:00 AM

## 2015-08-16 ENCOUNTER — Inpatient Hospital Stay (HOSPITAL_COMMUNITY): Payer: Medicare Other

## 2015-08-16 DIAGNOSIS — R509 Fever, unspecified: Secondary | ICD-10-CM

## 2015-08-16 DIAGNOSIS — M4624 Osteomyelitis of vertebra, thoracic region: Secondary | ICD-10-CM

## 2015-08-16 LAB — CBC WITH DIFFERENTIAL/PLATELET
BASOS ABS: 0 10*3/uL (ref 0.0–0.1)
BASOS PCT: 0 %
EOS PCT: 2 %
Eosinophils Absolute: 0.3 10*3/uL (ref 0.0–0.7)
HEMATOCRIT: 24.7 % — AB (ref 39.0–52.0)
Hemoglobin: 8 g/dL — ABNORMAL LOW (ref 13.0–17.0)
Lymphocytes Relative: 7 %
Lymphs Abs: 0.9 10*3/uL (ref 0.7–4.0)
MCH: 30.9 pg (ref 26.0–34.0)
MCHC: 32.4 g/dL (ref 30.0–36.0)
MCV: 95.4 fL (ref 78.0–100.0)
MONO ABS: 1 10*3/uL (ref 0.1–1.0)
MONOS PCT: 7 %
NEUTROS ABS: 11.6 10*3/uL — AB (ref 1.7–7.7)
Neutrophils Relative %: 84 %
PLATELETS: 311 10*3/uL (ref 150–400)
RBC: 2.59 MIL/uL — ABNORMAL LOW (ref 4.22–5.81)
RDW: 16.1 % — AB (ref 11.5–15.5)
WBC: 13.8 10*3/uL — ABNORMAL HIGH (ref 4.0–10.5)

## 2015-08-16 LAB — CULTURE, ROUTINE-ABSCESS
CULTURE: NO GROWTH
GRAM STAIN: NONE SEEN

## 2015-08-16 LAB — CULTURE, BLOOD (ROUTINE X 2)
Culture: NO GROWTH
Culture: NO GROWTH

## 2015-08-16 LAB — GLUCOSE, CAPILLARY
GLUCOSE-CAPILLARY: 153 mg/dL — AB (ref 65–99)
GLUCOSE-CAPILLARY: 173 mg/dL — AB (ref 65–99)
GLUCOSE-CAPILLARY: 164 mg/dL — AB (ref 65–99)
Glucose-Capillary: 153 mg/dL — ABNORMAL HIGH (ref 65–99)
Glucose-Capillary: 142 mg/dL — ABNORMAL HIGH (ref 65–99)

## 2015-08-16 MED ORDER — PERFLUTREN LIPID MICROSPHERE
1.0000 mL | INTRAVENOUS | Status: AC | PRN
  Administered 2015-08-16: 3 mL via INTRAVENOUS
  Filled 2015-08-16: qty 10

## 2015-08-16 NOTE — Progress Notes (Signed)
No acute events AVSS Full strength BLE Incision c/d/i with bandage Drain in place (350 yesterday) Stable Keep drain at least one more day No new recs

## 2015-08-16 NOTE — Progress Notes (Signed)
Patient states he will place himself on CPAP.  Water added to humidity chamber, patient advised to have RT called with any concerns with CPAP.

## 2015-08-16 NOTE — Progress Notes (Signed)
Admit: 08/10/2015 LOS: 5  81M ESRD MWF with T4-T5 pathological fracture presumed OM s/p surgical correction 08/12/15  Subjective:  No new events     11/05 0701 - 11/06 0700 In: 480 [P.O.:480] Out: 350 [Drains:350]  Filed Weights   08/14/15 1730 08/14/15 2032 08/15/15 2003  Weight: 140.8 kg (310 lb 6.5 oz) 140.2 kg (309 lb 1.4 oz) 144.743 kg (319 lb 1.6 oz)    Scheduled Meds: . amLODipine  10 mg Oral Daily  . antiseptic oral rinse  7 mL Mouth Rinse QID  . aspirin  81 mg Oral Daily  . calcium acetate  1,334 mg Oral TID WC  .  ceFAZolin (ANCEF) IV  2 g Intravenous Q M,W,F-HD  . chlorhexidine gluconate  15 mL Mouth Rinse BID  . cholecalciferol  1,000 Units Oral Daily  . heparin  5,000 Units Subcutaneous 3 times per day  . insulin aspart  0-20 Units Subcutaneous 6 times per day  . pantoprazole  40 mg Oral QHS  . traMADol  50 mg Oral Q12H   Continuous Infusions:   PRN Meds:.acetaminophen, albuterol, fentaNYL (SUBLIMAZE) injection, oxyCODONE-acetaminophen  Current Labs: reviewed  B Cx x2 from 11/1: NGTD Surgical Cx 11/2: NGTD   Physical Exam:  Blood pressure 148/68, pulse 98, temperature 98.8 F (37.1 C), temperature source Oral, resp. rate 16, height 5\' 8"  (1.727 m), weight 144.743 kg (319 lb 1.6 oz), SpO2 92 %. GEN: NAD, obese, intubated ENT: NCAT EYES: EOMI CV: RRR, no rub PULM: CTAB ABD: s/nt/nd SKIN: No rashes/lesiosn EXT:No LEE RUE with mild edema, stable  Outpt HD Orders Unit: [facility] Days: MWF Time: 4h Dialyzer: Elisio EDW: 138kg K/Ca: 2/2.5 Access: AVF, RFA Needle Size: 14g BFR/DFR: 500/800 VDRA: Hectorol 66mcg qTx EPO: Epogen 5000 units qTx Heparin: 4500 IU at start, then infusion 1000 IU/hr.  A/P 1. ESRD:  1. MWF [place] via AVF 2. 11/4 Tx curtailed 2/2 infiltration in setting of starting new buttonholes; electrolytes have been stable, volume ok; let AVF rest until Monday  3. Holding heparin  currently 2. Pathogical Fracture at T4 and T5 1. S/p laminectomy 11/2 2. Likely OM, Cx NGTD in blood and at spine 3. On cefazolin, recent Stah Lugdunesis bacteremia 4. 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 Postoperative VDRF: now extubated and resolved 4. HTN/Vol: Aim for EDW, BP stable 5. Anemia: Hb at goal siwtch to aranesp as inpt 6. MBD: cont PhosLo, cinacaclet,  1. PTH 407 2. Cont binder  [name] MD 08/16/2015, 7:41 AM   Recent Labs Lab 08/12/15 0737 08/12/15 0738  08/12/15 2159 08/13/15 0105 08/14/15 0424  NA 131*  --   < > 134* 135 134*  K 4.0  --   < > 4.0 4.3 4.6  CL 86*  --   --   --  97* 95*  CO2 30  --   --   --  27 26  GLUCOSE 219*  --   < > 193* 217* 157*  BUN 35*  --   --   --  22* 32*  CREATININE 9.72*  --   --   --  6.79* 9.24*  CALCIUM 8.5* 7.7*  --   --  7.9* 7.7*  PHOS 7.0*  --   --   --   --  7.0*  < > = values in this interval not displayed.  Recent Labs Lab 08/10/15 2217 08/12/15 0737  08/12/15 2053 08/12/15 2159 08/14/15 0424  WBC 13.5* 10.0  --   --   --  12.9*  NEUTROABS 10.7* 7.3  --   --   --   --   HGB 10.1* 9.6*  < > 9.5* 10.2* 9.3*  HCT 31.3* 30.0*  < > 28.0* 30.0* 28.4*  MCV 97.5 99.3  --   --   --  98.3  PLT 333 339  --   --   --  277  < > = values in this interval not displayed.

## 2015-08-16 NOTE — Progress Notes (Signed)
Patient seen and examined. Case d/w residents in detail. I agree with findings and plan as documented in Dr. Serita Grit note.  Patient feels well today. Continue with IV cefazolin for T4-5 osteomyelitis. Will need 6 week course. Neurosurgery follow up appreciated. Likely drain removal in AM. Will f/u 2 D ECHO. C/w HD per nephrology

## 2015-08-16 NOTE — Progress Notes (Signed)
   Subjective: Patient feels well, says pain is controlled. Mentions some mild tremors in his hands overnight. Objective: Vital signs in last 24 hours: Filed Vitals:   08/15/15 1500 08/15/15 2003 08/16/15 0412 08/16/15 0805  BP:  127/70 148/68 124/81  Pulse:  94 98 98  Temp:  98.2 F (36.8 C) 98.8 F (37.1 C) 98.3 F (36.8 C)  TempSrc:  Oral Oral Oral  Resp:  17 16 17   Height:      Weight:  319 lb 1.6 oz (144.743 kg)    SpO2: 90% 93% 92% 93%   Weight change: 4 lb 15.1 oz (2.243 kg)  Intake/Output Summary (Last 24 hours) at 08/16/15 0950 Last data filed at 08/15/15 2200  Gross per 24 hour  Intake    480 ml  Output    350 ml  Net    130 ml   General: pleasant, resting in bed, TTE being performed Ext: warm and well perfused, b/l lower extremities strength intact Neuro: alert and oriented X3  Assessment/Plan: Principal Problem:   Acute respiratory failure with hypoxia ([name]) Active Problems:   Cord compression ([name])   Back pain   Septic shock ([name])   Discitis of thoracic region  T4-T5 discitis/osteomyleitis: Post op day #4, s/p T4-T5 decompressive laminectomy, transpedicular corpectomy. Doing well, pain under control. On cefazolin based on prior Staph lugdunensis bacteremia. -Neurosurgery following, appreciate recommendations -Continue Cefazolin for 6 weeks course with HD sessions. Start date 11/3, course to be completed on 12/15 -Appreciate recommendations from Infectious Disease -f/u TTE results -Continue pain management -CIR tomorrow  ESRD on HD: MWF dialysis -Nephrology following, appreciate recommendations  HTN: Amlodipine 10 mg daily  T2DM: SSI-resistant   Dispo: Disposition is deferred at this time, awaiting improvement of current medical problems. CIR recommended when bed available and medically stable.   The patient does have a current PCP (Zada Finders, MD) and does need an [facility] hospital follow-up appointment after discharge.     LOS: 5 days    Zada Finders, MD 08/16/2015, 9:50 AM

## 2015-08-16 NOTE — Progress Notes (Signed)
Echocardiogram 2D Echocardiogram limited with Definity has been performed.  Tresa Res 08/16/2015, 8:55 AM

## 2015-08-17 ENCOUNTER — Encounter (HOSPITAL_COMMUNITY): Payer: Self-pay | Admitting: *Deleted

## 2015-08-17 ENCOUNTER — Inpatient Hospital Stay (HOSPITAL_COMMUNITY)
Admission: AD | Admit: 2015-08-17 | Discharge: 2015-09-09 | DRG: 539 | Disposition: A | Discharge status: Home Health | Payer: Medicare Other | Source: Intra-hospital | Attending: Physical Medicine & Rehabilitation | Admitting: Physical Medicine & Rehabilitation

## 2015-08-17 DIAGNOSIS — N2581 Secondary hyperparathyroidism of renal origin: Secondary | ICD-10-CM | POA: Diagnosis not present

## 2015-08-17 DIAGNOSIS — D62 Acute posthemorrhagic anemia: Secondary | ICD-10-CM | POA: Diagnosis not present

## 2015-08-17 DIAGNOSIS — K5900 Constipation, unspecified: Secondary | ICD-10-CM | POA: Diagnosis not present

## 2015-08-17 DIAGNOSIS — G4733 Obstructive sleep apnea (adult) (pediatric): Secondary | ICD-10-CM | POA: Diagnosis present

## 2015-08-17 DIAGNOSIS — E662 Morbid (severe) obesity with alveolar hypoventilation: Secondary | ICD-10-CM | POA: Diagnosis present

## 2015-08-17 DIAGNOSIS — E1122 Type 2 diabetes mellitus with diabetic chronic kidney disease: Secondary | ICD-10-CM | POA: Diagnosis present

## 2015-08-17 DIAGNOSIS — G9529 Other cord compression: Secondary | ICD-10-CM | POA: Diagnosis present

## 2015-08-17 DIAGNOSIS — Z992 Dependence on renal dialysis: Secondary | ICD-10-CM | POA: Diagnosis not present

## 2015-08-17 DIAGNOSIS — I120 Hypertensive chronic kidney disease with stage 5 chronic kidney disease or end stage renal disease: Secondary | ICD-10-CM | POA: Diagnosis not present

## 2015-08-17 DIAGNOSIS — T82898A Other specified complication of vascular prosthetic devices, implants and grafts, initial encounter: Secondary | ICD-10-CM | POA: Diagnosis not present

## 2015-08-17 DIAGNOSIS — D631 Anemia in chronic kidney disease: Secondary | ICD-10-CM | POA: Diagnosis not present

## 2015-08-17 DIAGNOSIS — D638 Anemia in other chronic diseases classified elsewhere: Secondary | ICD-10-CM | POA: Diagnosis present

## 2015-08-17 DIAGNOSIS — I152 Hypertension secondary to endocrine disorders: Secondary | ICD-10-CM | POA: Diagnosis present

## 2015-08-17 DIAGNOSIS — G9520 Unspecified cord compression: Secondary | ICD-10-CM | POA: Diagnosis not present

## 2015-08-17 DIAGNOSIS — M4644 Discitis, unspecified, thoracic region: Secondary | ICD-10-CM | POA: Diagnosis not present

## 2015-08-17 DIAGNOSIS — M4624 Osteomyelitis of vertebra, thoracic region: Secondary | ICD-10-CM | POA: Diagnosis not present

## 2015-08-17 DIAGNOSIS — N186 End stage renal disease: Secondary | ICD-10-CM

## 2015-08-17 DIAGNOSIS — I10 Essential (primary) hypertension: Secondary | ICD-10-CM | POA: Diagnosis not present

## 2015-08-17 DIAGNOSIS — M1711 Unilateral primary osteoarthritis, right knee: Secondary | ICD-10-CM

## 2015-08-17 DIAGNOSIS — M8448XA Pathological fracture, other site, initial encounter for fracture: Secondary | ICD-10-CM | POA: Diagnosis present

## 2015-08-17 DIAGNOSIS — Z96649 Presence of unspecified artificial hip joint: Secondary | ICD-10-CM | POA: Diagnosis present

## 2015-08-17 DIAGNOSIS — E118 Type 2 diabetes mellitus with unspecified complications: Secondary | ICD-10-CM | POA: Diagnosis not present

## 2015-08-17 DIAGNOSIS — E1159 Type 2 diabetes mellitus with other circulatory complications: Secondary | ICD-10-CM | POA: Diagnosis present

## 2015-08-17 DIAGNOSIS — E1110 Type 2 diabetes mellitus with ketoacidosis without coma: Secondary | ICD-10-CM

## 2015-08-17 LAB — CBC
HCT: 24.1 % — ABNORMAL LOW (ref 39.0–52.0)
Hemoglobin: 7.7 g/dL — ABNORMAL LOW (ref 13.0–17.0)
MCH: 30.4 pg (ref 26.0–34.0)
MCHC: 32 g/dL (ref 30.0–36.0)
MCV: 95.3 fL (ref 78.0–100.0)
PLATELETS: 302 10*3/uL (ref 150–400)
RBC: 2.53 MIL/uL — AB (ref 4.22–5.81)
RDW: 16.1 % — AB (ref 11.5–15.5)
WBC: 13.4 10*3/uL — AB (ref 4.0–10.5)

## 2015-08-17 LAB — GLUCOSE, CAPILLARY
GLUCOSE-CAPILLARY: 137 mg/dL — AB (ref 65–99)
GLUCOSE-CAPILLARY: 149 mg/dL — AB (ref 65–99)
GLUCOSE-CAPILLARY: 167 mg/dL — AB (ref 65–99)
Glucose-Capillary: 154 mg/dL — ABNORMAL HIGH (ref 65–99)
Glucose-Capillary: 205 mg/dL — ABNORMAL HIGH (ref 65–99)

## 2015-08-17 LAB — RENAL FUNCTION PANEL
ALBUMIN: 2.4 g/dL — AB (ref 3.5–5.0)
Anion gap: 13 (ref 5–15)
BUN: 76 mg/dL — AB (ref 6–20)
CALCIUM: 8.4 mg/dL — AB (ref 8.9–10.3)
CHLORIDE: 92 mmol/L — AB (ref 101–111)
CO2: 24 mmol/L (ref 22–32)
CREATININE: 12.14 mg/dL — AB (ref 0.61–1.24)
GFR, EST AFRICAN AMERICAN: 5 mL/min — AB (ref >60)
GFR, EST NON AFRICAN AMERICAN: 4 mL/min — AB (ref >60)
Glucose, Bld: 161 mg/dL — ABNORMAL HIGH (ref 65–99)
PHOSPHORUS: 8 mg/dL — AB (ref 2.5–4.6)
Potassium: 4.4 mmol/L (ref 3.5–5.1)
Sodium: 129 mmol/L — ABNORMAL LOW (ref 135–145)

## 2015-08-17 MED ORDER — TRAZODONE HCL 50 MG PO TABS: 25.0000 mg | ORAL_TABLET | Freq: Every evening | ORAL | Status: DC | PRN

## 2015-08-17 MED ORDER — ANTISEPTIC ORAL RINSE SOLUTION (CORINZ)
7.0000 mL | Freq: Four times a day (QID) | OROMUCOSAL | Status: DC
  Administered 2015-08-22 – 2015-09-06 (×10): 7 mL via OROMUCOSAL

## 2015-08-17 MED ORDER — CEFAZOLIN SODIUM-DEXTROSE 2-3 GM-% IV SOLR
2.0000 g | INTRAVENOUS | Status: DC
  Administered 2015-08-19 – 2015-09-09 (×7): 2 g via INTRAVENOUS
  Filled 2015-08-17 (×20): qty 50

## 2015-08-17 MED ORDER — PANTOPRAZOLE SODIUM 40 MG PO TBEC: 40.0000 mg | DELAYED_RELEASE_TABLET | Freq: Every day | ORAL | Status: DC

## 2015-08-17 MED ORDER — ASPIRIN 81 MG PO CHEW
81.0000 mg | CHEWABLE_TABLET | Freq: Every day | ORAL | Status: DC
  Administered 2015-08-18 – 2015-09-09 (×23): 81 mg via ORAL
  Filled 2015-08-17 (×23): qty 1

## 2015-08-17 MED ORDER — ENOXAPARIN SODIUM 30 MG/0.3ML ~~LOC~~ SOLN
30.0000 mg | SUBCUTANEOUS | Status: DC
  Administered 2015-08-17 – 2015-09-08 (×21): 30 mg via SUBCUTANEOUS
  Filled 2015-08-17 (×22): qty 0.3

## 2015-08-17 MED ORDER — ACETAMINOPHEN 325 MG PO TABS: 650.0000 mg | ORAL_TABLET | Freq: Four times a day (QID) | ORAL | Status: DC | PRN

## 2015-08-17 MED ORDER — SODIUM CHLORIDE 0.9 % IJ SOLN: 10.0000 mL | Freq: Two times a day (BID) | INTRAMUSCULAR | Status: DC

## 2015-08-17 MED ORDER — PROMETHAZINE HCL 25 MG/ML IJ SOLN: 12.5000 mg | Freq: Four times a day (QID) | INTRAMUSCULAR | Status: DC | PRN

## 2015-08-17 MED ORDER — OXYCODONE HCL 5 MG PO TABS
5.0000 mg | ORAL_TABLET | ORAL | Status: DC | PRN
  Administered 2015-08-18 – 2015-09-08 (×34): 10 mg via ORAL
  Filled 2015-08-17 (×38): qty 2

## 2015-08-17 MED ORDER — VITAMIN D 1000 UNITS PO TABS
1000.0000 [IU] | ORAL_TABLET | Freq: Every day | ORAL | Status: DC
  Administered 2015-08-18 – 2015-08-31 (×14): 1000 [IU] via ORAL
  Filled 2015-08-17 (×15): qty 1

## 2015-08-17 MED ORDER — AMLODIPINE BESYLATE 10 MG PO TABS
10.0000 mg | ORAL_TABLET | Freq: Every day | ORAL | Status: DC
  Administered 2015-08-18 – 2015-08-31 (×12): 10 mg via ORAL
  Filled 2015-08-17 (×13): qty 1

## 2015-08-17 MED ORDER — CALCITRIOL 0.5 MCG PO CAPS: 0.5000 ug | ORAL_CAPSULE | ORAL | Status: DC

## 2015-08-17 MED ORDER — CALCIUM ACETATE (PHOS BINDER) 667 MG PO CAPS: 2001.0000 mg | ORAL_CAPSULE | Freq: Three times a day (TID) | ORAL | Status: DC

## 2015-08-17 MED ORDER — BISACODYL 10 MG RE SUPP
10.0000 mg | Freq: Every day | RECTAL | Status: DC | PRN
  Administered 2015-08-19: 10 mg via RECTAL
  Filled 2015-08-17 (×2): qty 1

## 2015-08-17 MED ORDER — TRAMADOL HCL 50 MG PO TABS
50.0000 mg | ORAL_TABLET | Freq: Two times a day (BID) | ORAL | Status: DC
  Administered 2015-08-17 – 2015-08-18 (×2): 50 mg via ORAL
  Filled 2015-08-17 (×2): qty 1

## 2015-08-17 MED ORDER — SODIUM CHLORIDE 0.9 % IJ SOLN
10.0000 mL | INTRAMUSCULAR | Status: DC | PRN
  Administered 2015-08-17 – 2015-08-18 (×2): 10 mL
  Administered 2015-08-18: 20 mL
  Administered 2015-08-19: 10 mL
  Administered 2015-08-20: 20 mL
  Filled 2015-08-17 (×4): qty 40

## 2015-08-17 MED ORDER — CALCITRIOL 0.5 MCG PO CAPS
0.5000 ug | ORAL_CAPSULE | ORAL | Status: DC
  Administered 2015-08-19: 0.5 ug via ORAL
  Filled 2015-08-17 (×2): qty 1

## 2015-08-17 MED ORDER — SORBITOL 70 % SOLN
30.0000 mL | Freq: Three times a day (TID) | Status: DC | PRN
  Administered 2015-08-25: 30 mL via ORAL
  Filled 2015-08-17: qty 30

## 2015-08-17 MED ORDER — ALBUTEROL SULFATE (2.5 MG/3ML) 0.083% IN NEBU: 2.5000 mg | INHALATION_SOLUTION | RESPIRATORY_TRACT | Status: DC | PRN

## 2015-08-17 MED ORDER — CHLORHEXIDINE GLUCONATE 0.12% ORAL RINSE (MEDLINE KIT)
15.0000 mL | Freq: Two times a day (BID) | OROMUCOSAL | Status: DC
  Administered 2015-08-20 – 2015-09-07 (×17): 15 mL via OROMUCOSAL
  Filled 2015-08-17: qty 15

## 2015-08-17 MED ORDER — PANTOPRAZOLE SODIUM 40 MG PO TBEC
40.0000 mg | DELAYED_RELEASE_TABLET | Freq: Every day | ORAL | Status: DC
  Administered 2015-08-17 – 2015-09-08 (×23): 40 mg via ORAL
  Filled 2015-08-17 (×23): qty 1

## 2015-08-17 MED ORDER — SODIUM CHLORIDE 0.9 % IJ SOLN
10.0000 mL | INTRAMUSCULAR | Status: DC | PRN
  Administered 2015-08-17: 10 mL

## 2015-08-17 MED ORDER — CINACALCET HCL 30 MG PO TABS
30.0000 mg | ORAL_TABLET | Freq: Every day | ORAL | Status: DC
Start: 2015-08-18 — End: 2015-09-09
  Administered 2015-08-18 – 2015-09-09 (×23): 30 mg via ORAL
  Filled 2015-08-17 (×24): qty 1

## 2015-08-17 MED ORDER — DIPHENHYDRAMINE HCL 12.5 MG/5ML PO ELIX: 12.5000 mg | ORAL_SOLUTION | Freq: Four times a day (QID) | ORAL | Status: DC | PRN

## 2015-08-17 MED ORDER — DARBEPOETIN ALFA 100 MCG/0.5ML IJ SOSY
PREFILLED_SYRINGE | INTRAMUSCULAR | Status: AC
  Administered 2015-08-17: 100 ug via INTRAVENOUS
  Filled 2015-08-17: qty 0.5

## 2015-08-17 MED ORDER — PROMETHAZINE HCL 12.5 MG PO TABS: 12.5000 mg | ORAL_TABLET | Freq: Four times a day (QID) | ORAL | Status: DC | PRN

## 2015-08-17 MED ORDER — DARBEPOETIN ALFA 100 MCG/0.5ML IJ SOSY: 100.0000 ug | PREFILLED_SYRINGE | INTRAMUSCULAR | Status: DC

## 2015-08-17 MED ORDER — TRAMADOL HCL 50 MG PO TABS: 50.0000 mg | ORAL_TABLET | Freq: Two times a day (BID) | ORAL | Status: DC | PRN

## 2015-08-17 MED ORDER — GUAIFENESIN-DM 100-10 MG/5ML PO SYRP: 5.0000 mL | ORAL_SOLUTION | Freq: Four times a day (QID) | ORAL | Status: DC | PRN

## 2015-08-17 MED ORDER — CINACALCET HCL 30 MG PO TABS: 30.0000 mg | ORAL_TABLET | Freq: Every day | ORAL | Status: DC

## 2015-08-17 MED ORDER — SORBITOL 70 % SOLN
30.0000 mL | Freq: Once | Status: AC
  Administered 2015-08-17: 30 mL via ORAL
  Filled 2015-08-17: qty 30

## 2015-08-17 MED ORDER — CEFAZOLIN SODIUM-DEXTROSE 2-3 GM-% IV SOLR: 2.0000 g | INTRAVENOUS | Status: AC

## 2015-08-17 MED ORDER — DARBEPOETIN ALFA 100 MCG/0.5ML IJ SOSY
100.0000 ug | PREFILLED_SYRINGE | INTRAMUSCULAR | Status: DC
  Administered 2015-08-25 – 2015-08-30 (×2): 100 ug via INTRAVENOUS
  Filled 2015-08-17 (×2): qty 0.5

## 2015-08-17 MED ORDER — CALCIUM ACETATE (PHOS BINDER) 667 MG PO CAPS
2001.0000 mg | ORAL_CAPSULE | Freq: Three times a day (TID) | ORAL | Status: DC
  Administered 2015-08-18 – 2015-09-09 (×48): 2001 mg via ORAL
  Filled 2015-08-17 (×55): qty 3

## 2015-08-17 MED ORDER — PROMETHAZINE HCL 12.5 MG RE SUPP: 12.5000 mg | Freq: Four times a day (QID) | RECTAL | Status: DC | PRN

## 2015-08-17 MED ORDER — CALCIUM ACETATE (PHOS BINDER) 667 MG PO CAPS
2001.0000 mg | ORAL_CAPSULE | Freq: Three times a day (TID) | ORAL | Status: DC
  Administered 2015-08-17: 2001 mg via ORAL

## 2015-08-17 MED ORDER — FLEET ENEMA 7-19 GM/118ML RE ENEM: 1.0000 | ENEMA | Freq: Once | RECTAL | Status: DC | PRN

## 2015-08-17 MED ORDER — DARBEPOETIN ALFA 100 MCG/0.5ML IJ SOSY
100.0000 ug | PREFILLED_SYRINGE | INTRAMUSCULAR | Status: DC
  Administered 2015-08-17: 100 ug via INTRAVENOUS

## 2015-08-17 NOTE — Progress Notes (Signed)
Ankit Lorie Phenix, MD Physician Signed Physical Medicine and Rehabilitation Consult Note 08/14/2015 2:02 PM  Related encounter: ED to Hosp-Admission (Current) from 08/10/2015 in [place] Collapse All        Physical Medicine and Rehabilitation Consult   Reason for Consult: Osteomyelitis/diskitis T4 and T5 with pathologic fracture and cord compression Referring Physician: Dr. Chase Caller   HPI: Seth [name] is a 49 y.o. male with history of DM type 2, ESRD, OSA, recent Staph lugdunensis treated with 2 weeks of IV antibiotics. He was admitted on 08/10/15 with three week history of upper back pain progressing to BLE weakness with tingling and difficulty walking. MRI thoracic spine done revealing discitis-osteomyelitis at T4-5 with endplate destructive changes and vertebral body height loss and retropulsion of inferior margin of T4 and superior margin of T5 into spinal canal with moderate stenosis and cord compression. Patient was taken to OR on 11/02 for T4-T5 decompressive laminectomy with T4 and T5 transpedicular corpectomy and arthrodesis by Dr. Annette Stable. Did not tolerate extubation in PACU requiring re-intubation and placed on Cefzolin for 6 weeks IV therapy per ID input. He tolerated extubation on 11/03 and PT evaluation done this am. Pt has weakness in his RLE post-op.    Review of Systems  HENT: Negative for hearing loss.  Eyes: Negative for blurred vision and double vision.  Respiratory: Positive for cough. Negative for sputum production.  Cardiovascular: Negative for chest pain and palpitations.  Gastrointestinal: Negative for heartburn, nausea, abdominal pain and constipation.  Musculoskeletal: Positive for myalgias and back pain.  Skin: Negative for itching and rash.  Neurological: Negative for dizziness, tingling, speech change and headaches.  Psychiatric/Behavioral: Negative for depression and memory loss.  All other systems  reviewed and are negative.     Past Medical History  Diagnosis Date  . Hypertension   . ESRD (end stage renal disease) on dialysis ([place])   . 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 Sleep apnea     USES CPAP  . Diabetes mellitus     INSULIN DEPENDENT DIABETES  . Discitis 07/2015   Past Surgical History  Procedure Laterality Date  . Av fistula placement  05/03/2012    Procedure: ARTERIOVENOUS (AV) FISTULA CREATION; Surgeon: Mal [name], MD; Location: [place]; Service: Vascular; Laterality: Right;  . Tracheostomy tube placement N/A 08/20/2013    Procedure: TRACHEOSTOMY Revision; Surgeon: Melida Quitter, MD; Location: [place]; Service: ENT; Laterality: N/A;  . Tee without cardioversion N/A 07/10/2015    Procedure: TRANSESOPHAGEAL ECHOCARDIOGRAM (TEE); Surgeon: Josue Hector, MD; Location: [facility] ENDOSCOPY; Service: Cardiovascular; Laterality: N/A;   Family History  Problem Relation Age of Onset  . Diabetes Mother   . Diabetes Sister   . Diabetes Brother     Social History: Lives with sister and sons. He states he has 24/7 support at discharge. 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 Was independent without AD prior to last admission. Was using a wheelchair for a week PTA. He reports that he has never smoked. He has never used smokeless tobacco. Per reports that he drinks about 0.6 oz of alcohol per week. Per reports that he uses illicit drugs (Marijuana).    Allergies: No Known Allergies    Medications Prior to Admission  Medication Sig Dispense Refill  . amLODipine (NORVASC) 10 MG tablet Take 1 tablet (10 mg total) by mouth daily. 30 tablet 0  .  aspirin EC 81 MG tablet Take 81 mg by mouth daily.    . calcium acetate (PHOSLO) 667 MG capsule Take 2 capsules (1,334 mg total) by mouth 3 (three) times daily with meals. 180  capsule 0  . Cholecalciferol (VITAMIN D PO) Take 1 tablet by mouth daily.    . cyclobenzaprine (FLEXERIL) 10 MG tablet Take 1 tablet (10 mg total) by mouth 3 (three) times daily as needed for muscle spasms. 42 tablet 0  . insulin aspart (NOVOLOG) 100 UNIT/ML injection Inject 0-15 Units into the skin 3 (three) times daily with meals. CBG < 70: drink orange juice and recheck glucose or use glucose tabs CBG > 400: call MD   CBG 70 - 120: 0 units CBG 121 - 150: 3 units CBG 151 - 200: 4 units CBG 201 - 250: 7 units CBG 251 - 300: 11 units CBG 301 - 350: 15 units CBG 351 - 400: 20 units 1 vial 12  . albuterol (PROVENTIL) (2.5 MG/3ML) 0.083% nebulizer solution Take 3 mLs (2.5 mg total) by nebulization every 6 (six) hours as needed for wheezing or shortness of breath. (Patient not taking: Reported on 07/17/2015) 75 mL 12  . ceFAZolin (ANCEF) 2-3 GM-% SOLR Inject 50 mLs (2 g total) into the vein every Monday, Wednesday, and Friday with hemodialysis. (Patient not taking: Reported on 08/05/2015)    . Darbepoetin Alfa (ARANESP) 150 MCG/0.3ML SOSY injection Inject 0.3 mLs (150 mcg total) into the vein every Monday with hemodialysis. 1.68 mL 0  . diazepam (VALIUM) 2 MG tablet Take 1 tablet (2 mg total) by mouth every 8 (eight) hours as needed for muscle spasms. (Patient not taking: Reported on 07/17/2015) 30 tablet 0  . gabapentin (NEURONTIN) 300 MG capsule Take 1 capsule (300 mg total) by mouth 3 (three) times daily. 90 capsule 0  . naproxen (NAPROSYN) 500 MG tablet Take 1 tablet (500 mg total) by mouth 2 (two) times daily with a meal. (Patient not taking: Reported on 08/05/2015) 10 tablet 0  . predniSONE (DELTASONE) 20 MG tablet Take 2 tablets (40 mg total) by mouth daily. (Patient not taking: Reported on 07/17/2015) 10 tablet 0  . traMADol (ULTRAM) 50 MG tablet Take 1 tablet (50 mg total) by mouth every 6 (six) hours as needed. For back pain (Patient not  taking: Reported on 08/05/2015) 15 tablet 0  . Vitamin D, Ergocalciferol, (DRISDOL) 50000 UNITS CAPS capsule Take 1 capsule (50,000 Units total) by mouth every 7 (seven) days. *takes on Friday* 30 capsule 0    Home: Home Living Family/patient expects to be discharged to:: Inpatient rehab Living Arrangements: Alone  Functional History: Prior Function Level of Independence: Independent Functional Status:  Mobility: Bed Mobility Overal bed mobility: Needs Assistance, +2 for physical assistance Bed Mobility: Rolling, Sidelying to Sit, Sit to Sidelying Rolling: Mod assist, +2 for physical assistance Sidelying to sit: Mod assist, +2 for physical assistance Sit to sidelying: Max assist, +2 for physical assistance General bed mobility comments: cues for log roll technique and encouragement. pt very painful with bed mobility.  Transfers Overall transfer level: Needs assistance Equipment used: 2 person hand held assist Transfers: Sit to/from Stand Sit to Stand: Max assist, +2 physical assistance, From elevated surface General transfer comment: Attempted to come to stand x2 with height of bed elevated and pt only able to achieve squat position both times. LEs blocked, but unable to extend Bil knees.       ADL:    Cognition: Cognition Overall Cognitive Status:  Within Functional Limits for tasks assessed Orientation Level: Oriented X4 Cognition Arousal/Alertness: Awake/alert Behavior During Therapy: WFL for tasks assessed/performed Overall Cognitive Status: Within Functional Limits for tasks assessed  Blood pressure 134/68, pulse 96, temperature 98.6 F (37 C), temperature source Oral, resp. rate 15, height 5\' 8"  (1.727 m), weight 138.9 kg (306 lb 3.5 oz), SpO2 96 %. Physical Exam  Nursing note and vitals reviewed. Constitutional: He is oriented to person, place, and time. He appears well-developed and well-nourished. Nasal cannula in place.  Morbidly obese male lying  in bed. NAD.  HENT:  Head: Normocephalic and atraumatic.  Eyes: Conjunctivae and EOM are normal. Pupils are equal, round, and reactive to light.  Neck: Normal range of motion. Neck supple.  Cardiovascular: Normal rate and regular rhythm.  No murmur heard. Respiratory: Effort normal. No respiratory distress. He has no wheezes.  Upper airway sounds  GI: Soft. Bowel sounds are normal. He exhibits no distension. There is no tenderness.  Musculoskeletal: He exhibits edema. He exhibits no tenderness.  Strength B/l UE 4+/5 throughout LLE hip flexion 2+/5, ankle dorsi/plantar flexion 4/5 RLE: hip flexion 2-/5, ankle dorsi/plantar flexion 3/5  Neurological: He is alert and oriented to person, place, and time. He has normal reflexes.  Intentional tremors BUE. Sensation intact to light touch  Skin: Skin is warm and dry. No rash noted. No erythema.  Psychiatric: He has a normal mood and affect. His behavior is normal. Thought content normal.     Lab Results Last 24 Hours    Results for orders placed or performed during the hospital encounter of 08/10/15 (from the past 24 hour(s))  Glucose, capillary Status: Abnormal   Collection Time: 08/13/15 3:40 PM  Result Value Ref Range   Glucose-Capillary 156 (H) 65 - 99 mg/dL  Glucose, capillary Status: Abnormal   Collection Time: 08/13/15 8:03 PM  Result Value Ref Range   Glucose-Capillary 164 (H) 65 - 99 mg/dL   Comment 1 Notify RN   Glucose, capillary Status: Abnormal   Collection Time: 08/13/15 11:56 PM  Result Value Ref Range   Glucose-Capillary 160 (H) 65 - 99 mg/dL   Comment 1 Notify RN   Glucose, capillary Status: Abnormal   Collection Time: 08/14/15 3:43 AM  Result Value Ref Range   Glucose-Capillary 156 (H) 65 - 99 mg/dL   Comment 1 Notify RN   CBC Status: Abnormal   Collection Time: 08/14/15 4:24 AM  Result Value Ref Range   WBC 12.9 (H) 4.0  - 10.5 K/uL   RBC 2.89 (L) 4.22 - 5.81 MIL/uL   Hemoglobin 9.3 (L) 13.0 - 17.0 g/dL   HCT 28.4 (L) 39.0 - 52.0 %   MCV 98.3 78.0 - 100.0 fL   MCH 32.2 26.0 - 34.0 pg   MCHC 32.7 30.0 - 36.0 g/dL   RDW 16.6 (H) 11.5 - 15.5 %   Platelets 277 150 - 400 K/uL  Magnesium Status: None   Collection Time: 08/14/15 4:24 AM  Result Value Ref Range   Magnesium 1.9 1.7 - 2.4 mg/dL  Phosphorus Status: Abnormal   Collection Time: 08/14/15 4:24 AM  Result Value Ref Range   Phosphorus 7.0 (H) 2.5 - 4.6 mg/dL  Basic metabolic panel Status: Abnormal   Collection Time: 08/14/15 4:24 AM  Result Value Ref Range   Sodium 134 (L) 135 - 145 mmol/L   Potassium 4.6 3.5 - 5.1 mmol/L   Chloride 95 (L) 101 - 111 mmol/L   CO2 26 22 - 32 mmol/L  Glucose, Bld 157 (H) 65 - 99 mg/dL   BUN 32 (H) 6 - 20 mg/dL   Creatinine, Ser 9.24 (H) 0.61 - 1.24 mg/dL   Calcium 7.7 (L) 8.9 - 10.3 mg/dL   GFR calc non Af Amer 6 (L) >60 mL/min   GFR calc Af Amer 7 (L) >60 mL/min   Anion gap 13 5 - 15  Glucose, capillary Status: Abnormal   Collection Time: 08/14/15 7:50 AM  Result Value Ref Range   Glucose-Capillary 166 (H) 65 - 99 mg/dL  Glucose, capillary Status: Abnormal   Collection Time: 08/14/15 11:19 AM  Result Value Ref Range   Glucose-Capillary 221 (H) 65 - 99 mg/dL      Imaging Results (Last 48 hours)    Dg Thoracic Spine 1 View  08/12/2015 CLINICAL DATA: 49 year old with discitis/osteomyelitis at T4-5. Debridement and fusion. EXAM: OPERATIVE THORACIC SPINE 1 VIEW(S) COMPARISON: MRI thoracic spine 08/11/2015 and CT thoracic spine 08/10/2015. FINDINGS: Two AP spot images of the thoracic spine were obtained with the C-arm fluoroscopic device and submitted for interpretation postoperatively. These demonstrate bilateral pedicle screws at T1, T2, T3, T6 and T7. Osteolysis  of T4 and T5 again noted. The radiologic technologist documented 39 sec of fluoroscopy time. IMPRESSION: Images obtained during debridement of discitis/osteomyelitis at T4-5 and thoracic spine fusion. Electronically Signed By: Evangeline Dakin M.D. On: 08/12/2015 22:51   Portable Chest Xray  08/14/2015 CLINICAL DATA: Respiratory failure EXAM: PORTABLE CHEST 1 VIEW COMPARISON: July 09, 2015 FINDINGS: There is mild atelectatic change in the right base. The lungs elsewhere clear. Heart is upper normal in size with pulmonary vascularity within normal limits. Central catheter tip is in the superior vena cava, just beyond the junction with the left innominate vein. No pneumothorax. There is postoperative change in the thoracic spine with multiple levels of pedicle screw and plate fixation. IMPRESSION: Mild atelectasis right lower lobe. Lungs elsewhere clear. Central catheter as described without pneumothorax. Heart upper normal in size. Electronically Signed By: Lowella Grip III M.D. On: 08/14/2015 07:51   Dg C-arm 61-120 Min  08/12/2015 CLINICAL DATA: 49 year old with discitis/osteomyelitis at T4-5. Debridement and fusion. EXAM: OPERATIVE THORACIC SPINE 1 VIEW(S) COMPARISON: MRI thoracic spine 08/11/2015 and CT thoracic spine 08/10/2015. FINDINGS: Two AP spot images of the thoracic spine were obtained with the C-arm fluoroscopic device and submitted for interpretation postoperatively. These demonstrate bilateral pedicle screws at T1, T2, T3, T6 and T7. Osteolysis of T4 and T5 again noted. The radiologic technologist documented 39 sec of fluoroscopy time. IMPRESSION: Images obtained during debridement of discitis/osteomyelitis at T4-5 and thoracic spine fusion. Electronically Signed By: Evangeline Dakin M.D. On: 08/12/2015 22:51     Assessment/Plan: Diagnosis: Cord compression secondary to Osteomyelitis/diskitis T4 and T5 with pathologic fracture Labs and images independently  reviewed. Records reviewed and summated above.  Respiratory: encourage early use of incentive spirometry as tolerated, assisted cough and deep breathing techniques. Chest physiotherapy if no contraindications. May consider use of abdominal binder for better diaphragmatic excursion.   Skin: daily skin checks, turn q2 (care with the spine), PRAFO, continue use pressure relieving mattress   Cardiovascular: anticipate orthostasis when OOB. May use abdominal binder, TEDs or ace wraps to BLE for this. If ineffective, consider salt tabs, midodrine or fludrocortisone.   Extremities:. pt is at risk for flexion contractures, especially the hip, also at risk for heterotrophic ossification. Continue ROM.  Psych: psychology consult for adjustment to disability for pt and family  Mobility: PT and OT evaluation for mobility, ADLS, strengthening,  and wheel chair training  Electrolyte: at risk for immobilization hypercalcemia, monitor labs.  Pain Management: control with oral medications if possible  1. Does the need for close, 24 hr/day medical supervision in concert with the patient's rehab needs make it unreasonable for this patient to be served in a less intensive setting? Yes  2. Co-Morbidities requiring supervision/potential complications: ESRD on HD (Follow endurance and strength changes surrounding dialysis schedule and work with nephrology to optimize fluid status), DM (Monitor in accordance with exercise and adjust meds as necessary), HTN (monitor and provide prns in accordance with increased physical exertion and pain), bacteremia (cont to follow labs), post-op pain (attempt to manage with oral meds), leukocytosis (cont to follow labs), ABLA (transfuse if necessary to ensure appropriate perfusion for increased activity tolerance), morbid obesity (encourage weight loss to increase endurance and promote overall health) 3. Due to safety, skin/wound care, disease management,  medication administration, pain management and patient education, does the patient require 24 hr/day rehab nursing? Yes 4. Does the patient require coordinated care of a physician, rehab nurse, PT (1.5-2 hrs/day, 5 days/week) and OT (1.5-2 hrs/day, 5 days/week) to address physical and functional deficits in the context of the above medical diagnosis(es)? Yes Addressing deficits in the following areas: balance, endurance, locomotion, strength, transferring, bathing, dressing, grooming, toileting and psychosocial support 5. Can the patient actively participate in an intensive therapy program of at least 3 hrs of therapy per day at least 5 days per week? Yes 6. The potential for patient to make measurable gains while on inpatient rehab is good 7. Anticipated functional outcomes upon discharge from inpatient rehab are modified independent, supervision and min assist with PT, modified independent, supervision and min assist with OT, n/a with SLP. 8. Estimated rehab length of stay to reach the above functional goals is: 16-19 days.  9. Does the patient have adequate social supports and living environment to accommodate these discharge functional goals? Yes 10. Anticipated D/C setting: Home 11. Anticipated post D/C treatments: HH therapy and Home excercise program 12. Overall Rehab/Functional Prognosis: good  RECOMMENDATIONS: This patient's condition is appropriate for continued rehabilitative care in the following setting: CIR after medical workup completed (including TTE). Patient has agreed to participate in recommended program. Yes Note that insurance prior authorization may be required for reimbursement for recommended care.  Comment: Rehab Admissions Coordinator to follow up.  Delice Lesch, MD 08/14/2015       Revision History     Date/Time User Provider Type Action   08/14/2015 3:05 PM Ankit Lorie Phenix, MD Physician Sign   08/14/2015 2:36 PM Bary Leriche, PA-C Physician Assistant Pend    View Details Report       Routing History     Date/Time From To Method   08/14/2015 3:05 PM Ankit Lorie Phenix, MD Ankit Lorie Phenix, MD In [facility]   08/14/2015 3:05 PM Ankit Lorie Phenix, MD Zada Finders, MD In [facility]

## 2015-08-17 NOTE — Progress Notes (Signed)
Patient seen and examined. Case d/w residents in detail. I agree with findings and plan as documented in Dr. Serita Grit note.  Patient feels well and is scheduled for admission to CIR today. Will c/w IV abx (cefazolin) to complete 6 week course for his thoracic osteomyelitis. Cultures remain negative. C/w HD per nephrology

## 2015-08-17 NOTE — Progress Notes (Signed)
PT Cancellation Note  Patient Details Name: MOHAMMAD OROS MRN: [id number] DOB: Apr 27, 1966   Cancelled Treatment:    Reason Eval/Treat Not Completed: Patient declined, no reason specified (heading to HD and rehab inpt).  Will try later if time allows.   Ramond Dial 08/17/2015, 12:11 PM   Mee Hives, PT MS Acute Rehab Dept. Number: [facility] [id number] and [place] [phone]

## 2015-08-17 NOTE — Progress Notes (Signed)
Received pt. As a transfer from 6 east.Pt. Has been oriented to the unit routine and protocol.Safety plan was sign,fall precaution plan was explained.Welcome video was play.Keep monitoring pt. Closely and assessing his needs.

## 2015-08-17 NOTE — H&P (Signed)
Physical Medicine and Rehabilitation Admission H&P    Chief Complaint  Patient presents with  . 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 male with history of DM type 2, ESRD, OSA, recent Staph lugdunensis treated with 2 weeks of IV antibiotics. He was admitted on 08/10/15 with three week history of upper back pain progressing to BLE weakness with tingling and difficulty walking. MRI thoracic spine done revealing discitis-osteomyelitis at T4-5 with endplate destructive changes and vertebral body height loss and retropulsion of inferior margin of T4 and superior margin of T5 into spinal canal with moderate stenosis and cord compression. Patient was taken to OR on 11/02 for T4-T5 decompressive laminectomy with T4 and T5 transpedicular corpectomy and arthrodesis by Dr. Annette Stable. Did not tolerate extubation in PACU requiring re-intubation and placed on Cefzolin for 6 weeks IV therapy per ID input. He tolerated extubation without difficulty on 11/03. 2D echo done for work up revealing EF 65-70% with no wall abnormality and grade 1 diastolic dysfunction. Pt has greatest weakness in his RLE post-op.    Review of Systems  Constitutional: Negative for malaise/fatigue.  HENT: Negative for hearing loss.   Eyes: Negative for blurred vision and double vision.  Respiratory: Negative for cough and shortness of breath.   Cardiovascular: Negative for chest pain, palpitations and leg swelling.  Gastrointestinal: Positive for constipation (No BM since surgery). Negative for heartburn and nausea.  Genitourinary: Negative for urgency.  Musculoskeletal: Positive for myalgias and back pain.  Skin: Negative for itching and rash.  Neurological: Positive for tingling, focal weakness and weakness. Negative for dizziness, sensory change and headaches.  Psychiatric/Behavioral: Negative for depression. The patient is nervous/anxious. The  patient does not have insomnia.   All other systems reviewed and are negative.     Past Medical History  Diagnosis Date  . Hypertension   . ESRD (end stage renal disease) on dialysis ([place])   . 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 Sleep apnea     USES CPAP  . Diabetes mellitus     INSULIN DEPENDENT DIABETES  . Discitis 07/2015    Past Surgical History  Procedure Laterality Date  . Av fistula placement  05/03/2012    Procedure: ARTERIOVENOUS (AV) FISTULA CREATION;  Surgeon: Mal [name], MD;  Location: [facility];  Service: Vascular;  Laterality: Right;  . Tracheostomy tube placement N/A 08/20/2013    Procedure: TRACHEOSTOMY Revision;  Surgeon: Melida Quitter, MD;  Location: [facility];  Service: ENT;  Laterality: N/A;  . Tee without cardioversion N/A 07/10/2015    Procedure: TRANSESOPHAGEAL ECHOCARDIOGRAM (TEE);  Surgeon: Josue Hector, MD;  Location: [facility] ENDOSCOPY;  Service: Cardiovascular;  Laterality: N/A;    Family History  Problem Relation Age of Onset  . Diabetes Mother   . Diabetes Sister   . Diabetes Brother     Social History:  Lives with sister and sons. He states he has 24/7 support at discharge. 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 Was independent without AD prior to last admission. Was using a wheelchair for a week PTA. He reports that he has never smoked. He has never used smokeless tobacco. Per reports that he drinks about 0.6 oz of alcohol per week. Per reports that he uses illicit drugs (Marijuana).    Allergies: No Known Allergies  Medications Prior to Admission  Medication Sig Dispense Refill  . amLODipine (NORVASC) 10 MG tablet Take 1 tablet (10 mg total) by mouth daily. 30 tablet 0  . aspirin EC 81 MG tablet Take 81 mg by mouth daily.    . calcium acetate (PHOSLO) 667 MG capsule Take 2 capsules (1,334 mg total) by mouth 3 (three) times daily with meals. 180 capsule 0  . Cholecalciferol  (VITAMIN D PO) Take 1 tablet by mouth daily.    . cyclobenzaprine (FLEXERIL) 10 MG tablet Take 1 tablet (10 mg total) by mouth 3 (three) times daily as needed for muscle spasms. 42 tablet 0  . insulin aspart (NOVOLOG) 100 UNIT/ML injection Inject 0-15 Units into the skin 3 (three) times daily with meals. CBG < 70: drink orange juice and recheck glucose or use glucose tabs CBG > 400: call MD   CBG 70 - 120: 0 units CBG 121 - 150: 3 units CBG 151 - 200: 4 units CBG 201 - 250: 7 units CBG 251 - 300: 11 units CBG 301 - 350: 15 units CBG 351 - 400: 20 units 1 vial 12  . albuterol (PROVENTIL) (2.5 MG/3ML) 0.083% nebulizer solution Take 3 mLs (2.5 mg total) by nebulization every 6 (six) hours as needed for wheezing or shortness of breath. (Patient not taking: Reported on 07/17/2015) 75 mL 12  . ceFAZolin (ANCEF) 2-3 GM-% SOLR Inject 50 mLs (2 g total) into the vein every Monday, Wednesday, and Friday with hemodialysis. (Patient not taking: Reported on 08/05/2015)    . Darbepoetin Alfa (ARANESP) 150 MCG/0.3ML SOSY injection Inject 0.3 mLs (150 mcg total) into the vein every Monday with hemodialysis. 1.68 mL 0  . diazepam (VALIUM) 2 MG tablet Take 1 tablet (2 mg total) by mouth every 8 (eight) hours as needed for muscle spasms. (Patient not taking: Reported on 07/17/2015) 30 tablet 0  . gabapentin (NEURONTIN) 300 MG capsule Take 1 capsule (300 mg total) by mouth 3 (three) times daily. 90 capsule 0  . naproxen (NAPROSYN) 500 MG tablet Take 1 tablet (500 mg total) by mouth 2 (two) times daily with a meal. (Patient not taking: Reported on 08/05/2015) 10 tablet 0  . predniSONE (DELTASONE) 20 MG tablet Take 2 tablets (40 mg total) by mouth daily. (Patient not taking: Reported on 07/17/2015) 10 tablet 0  . traMADol (ULTRAM) 50 MG tablet Take 1 tablet (50 mg total) by mouth every 6 (six) hours as needed. For back pain (Patient not taking: Reported on 08/05/2015) 15 tablet 0  . Vitamin D, Ergocalciferol, (DRISDOL)  50000 UNITS CAPS capsule Take 1 capsule (50,000 Units total) by mouth every 7 (seven) days. *takes on Friday* 30 capsule 0    Home: Home Living Family/patient expects to be discharged to:: Inpatient rehab Living Arrangements: Alone   Functional History: Prior Function Level of Independence: Independent  Functional Status:  Mobility: Bed Mobility Overal bed mobility: Needs Assistance, +2 for physical assistance Bed Mobility: Rolling, Sidelying to Sit, Sit to Sidelying Rolling: Mod assist, +2 for physical assistance Sidelying to sit: Mod assist, +2 for physical assistance Sit to sidelying: Max assist, +2 for physical assistance General bed mobility comments: cues for log roll technique and encouragement.  pt very painful with bed mobility.   Transfers Overall transfer level: Needs assistance Equipment used: 2 person hand held assist Transfers: Sit to/from Stand Sit to Stand: Max assist, +2 physical assistance, From elevated surface General transfer comment: Attempted to come to stand x2 with height of bed  elevated and pt only able to achieve squat position both times.  LEs blocked, but unable to extend Bil knees.        ADL:    Cognition: Cognition Overall Cognitive Status: Within Functional Limits for tasks assessed Orientation Level: Oriented X4 Cognition Arousal/Alertness: Awake/alert Behavior During Therapy: WFL for tasks assessed/performed Overall Cognitive Status: Within Functional Limits for tasks assessed   Blood pressure 135/57, pulse 91, temperature 99.3 F (37.4 C), temperature source Oral, resp. rate 16, height _0  (1.727 m), weight 144.7 kg (319 lb 0.1 oz), SpO2 96 %. Physical Exam  Nursing note and vitals reviewed. Constitutional: He is oriented to person, place, and time. He appears well-developed and well-nourished.  Morbidly obese male lying in bed. NAD.  HENT:  Head: Normocephalic and atraumatic.  Eyes: Conjunctivae and EOM are normal. Pupils are  equal, round, and reactive to light.  Neck: Normal range of motion. Neck supple.  Central line left neck  Cardiovascular: Normal rate and regular rhythm.   Respiratory: Effort normal and breath sounds normal. No respiratory distress. He has no wheezes.  GI: Soft. Bowel sounds are normal. He exhibits no distension. There is no tenderness.  Reducible umbilical hernia  Musculoskeletal: He exhibits no edema or tenderness.  Strength B/l UE 4+/5 throughout LLE hip flexion 2+/5, ankle dorsi/plantar flexion 4/5 RLE: hip flexion 2-/5, ankle dorsi/plantar flexion 3+/5   Neurological: He is alert and oriented to person, place, and time.  Speech clear.  Able to follow commands without difficulty.  He is alert and oriented to person, place, and time.  He has normal reflexes.  Intentional tremors BUE. Sensation intact to light touch   Skin: Skin is warm and dry.  Multiple healing scabs BLE. Dry flaky skin bilateral feet and thighs.   Psychiatric: He has a normal mood and affect. His speech is normal. Thought content normal. Cognition and memory are normal.    Results for orders placed or performed during the hospital encounter of 08/10/15 (from the past 48 hour(s))  Glucose, capillary     Status: Abnormal   Collection Time: 08/15/15 11:31 AM  Result Value Ref Range   Glucose-Capillary 165 (H) 65 - 99 mg/dL  Glucose, capillary     Status: Abnormal   Collection Time: 08/15/15  4:52 PM  Result Value Ref Range   Glucose-Capillary 165 (H) 65 - 99 mg/dL  Glucose, capillary     Status: Abnormal   Collection Time: 08/15/15  8:01 PM  Result Value Ref Range   Glucose-Capillary 172 (H) 65 - 99 mg/dL  Glucose, capillary     Status: None   Collection Time: 08/15/15 11:47 PM  Result Value Ref Range   Glucose-Capillary 86 65 - 99 mg/dL  Glucose, capillary     Status: Abnormal   Collection Time: 08/16/15  4:06 AM  Result Value Ref Range   Glucose-Capillary 153 (H) 65 - 99 mg/dL  Glucose, capillary      Status: Abnormal   Collection Time: 08/16/15  8:05 AM  Result Value Ref Range   Glucose-Capillary 153 (H) 65 - 99 mg/dL  Glucose, capillary     Status: Abnormal   Collection Time: 08/16/15 11:55 AM  Result Value Ref Range   Glucose-Capillary 173 (H) 65 - 99 mg/dL  CBC with Differential/Platelet     Status: Abnormal   Collection Time: 08/16/15  3:20 PM  Result Value Ref Range   WBC 13.8 (H) 4.0 - 10.5 K/uL   RBC 2.59 (L) 4.22 - 5.81  MIL/uL   Hemoglobin 8.0 (L) 13.0 - 17.0 g/dL   HCT 24.7 (L) 39.0 - 52.0 %   MCV 95.4 78.0 - 100.0 fL   MCH 30.9 26.0 - 34.0 pg   MCHC 32.4 30.0 - 36.0 g/dL   RDW 16.1 (H) 11.5 - 15.5 %   Platelets 311 150 - 400 K/uL   Neutrophils Relative % 84 %   Neutro Abs 11.6 (H) 1.7 - 7.7 K/uL   Lymphocytes Relative 7 %   Lymphs Abs 0.9 0.7 - 4.0 K/uL   Monocytes Relative 7 %   Monocytes Absolute 1.0 0.1 - 1.0 K/uL   Eosinophils Relative 2 %   Eosinophils Absolute 0.3 0.0 - 0.7 K/uL   Basophils Relative 0 %   Basophils Absolute 0.0 0.0 - 0.1 K/uL  Glucose, capillary     Status: Abnormal   Collection Time: 08/16/15  3:50 PM  Result Value Ref Range   Glucose-Capillary 164 (H) 65 - 99 mg/dL  Glucose, capillary     Status: Abnormal   Collection Time: 08/16/15  8:19 PM  Result Value Ref Range   Glucose-Capillary 142 (H) 65 - 99 mg/dL  Glucose, capillary     Status: Abnormal   Collection Time: 08/17/15 12:09 AM  Result Value Ref Range   Glucose-Capillary 137 (H) 65 - 99 mg/dL  Renal function panel     Status: Abnormal   Collection Time: 08/17/15  3:55 AM  Result Value Ref Range   Sodium 129 (L) 135 - 145 mmol/L   Potassium 4.4 3.5 - 5.1 mmol/L   Chloride 92 (L) 101 - 111 mmol/L   CO2 24 22 - 32 mmol/L   Glucose, Bld 161 (H) 65 - 99 mg/dL   BUN 76 (H) 6 - 20 mg/dL   Creatinine, Ser 12.14 (H) 0.61 - 1.24 mg/dL   Calcium 8.4 (L) 8.9 - 10.3 mg/dL   Phosphorus 8.0 (H) 2.5 - 4.6 mg/dL   Albumin 2.4 (L) 3.5 - 5.0 g/dL   GFR calc non Af Amer 4 (L) >60 mL/min     GFR calc Af Amer 5 (L) >60 mL/min    Comment: (NOTE) The eGFR has been calculated using the CKD EPI equation. This calculation has not been validated in all clinical situations. eGFR's persistently <60 mL/min signify possible Chronic Kidney Disease.    Anion gap 13 5 - 15  CBC     Status: Abnormal   Collection Time: 08/17/15  3:55 AM  Result Value Ref Range   WBC 13.4 (H) 4.0 - 10.5 K/uL   RBC 2.53 (L) 4.22 - 5.81 MIL/uL   Hemoglobin 7.7 (L) 13.0 - 17.0 g/dL   HCT 24.1 (L) 39.0 - 52.0 %   MCV 95.3 78.0 - 100.0 fL   MCH 30.4 26.0 - 34.0 pg   MCHC 32.0 30.0 - 36.0 g/dL   RDW 16.1 (H) 11.5 - 15.5 %   Platelets 302 150 - 400 K/uL  Glucose, capillary     Status: Abnormal   Collection Time: 08/17/15  4:44 AM  Result Value Ref Range   Glucose-Capillary 167 (H) 65 - 99 mg/dL  Glucose, capillary     Status: Abnormal   Collection Time: 08/17/15  8:03 AM  Result Value Ref Range   Glucose-Capillary 149 (H) 65 - 99 mg/dL   Comment 1 Notify RN    No results found.     Medical Problem List and Plan: 1. Functional deficits secondary to Cord compression secondary to Osteomyelitis/diskitis T4  and T5 with pathologic fracture 2.  DVT Prophylaxis/Anticoagulation: Pharmaceutical: Lovenox 3. Pain Management: Continue tramadol bid with oxycodone prn. 4. Mood: LCSW to follow for evaluation and support.  5. Neuropsych: This patient is capable of making decisions on his own behalf. 6. Skin/Wound Care: Monitor wound daily.   7. Fluids/Electrolytes/Nutrition: Monitor I/O.  ON renal diet with 1200 cc/FR.  8. ESRD: On HD MWF. Schedule sessions later in afternoons to help with therapy tolerance.  9. HTN: Monitor BID. Continue Norvasc.   10. Anemia of chronic disease: Has had drop in Hgb to 7.7. Was on aranesp weekly PTA. Renal following of input on transfusion and treatment. Monitor for symptoms.  11. DM type 2:  Monitor BS ac/hs and use SSI for elevated BS.  12. OSA:  Continue use of CPAP.     Post Admission Physician Evaluation: 1. Functional deficits secondary  to Cord compression secondary to Osteomyelitis/diskitis T4 and T5 with pathologic fracture 2. Patient is admitted to receive collaborative, interdisciplinary care between the physiatrist, rehab nursing staff, and therapy team. 3. Patient's level of medical complexity and substantial therapy needs in context of that medical necessity cannot be provided at a lesser intensity of care such as a SNF. 4. Patient has experienced substantial functional loss from his/her baseline which was documented above under the "Functional History" and "Functional Status" headings.  Judging by the patient's diagnosis, physical exam, and functional history, the patient has potential for functional progress which will result in measurable gains while on inpatient rehab.  These gains will be of substantial and practical use upon discharge  in facilitating mobility and self-care at the household level. 5. Physiatrist will provide 24 hour management of medical needs as well as oversight of the therapy plan/treatment and provide guidance as appropriate regarding the interaction of the two. 6. 24 hour rehab nursing will assist with safety, skin/wound care, disease management, medication administration, pain management and patient education and help integrate therapy concepts, techniques,education, etc. 7. PT will assess and treat for/with: Lower extremity strength, range of motion, stamina, balance, functional mobility, safety, adaptive techniques and equipment, woundcare, coping skills, pain control, disease specific and general health education.   Goals are: Min A/Supervision. 8. OT will assess and treat for/with: ADL's, functional mobility, safety, upper extremity strength, adaptive techniques and equipment, wound mgt, ego support, and community reintegration.   Goals are: Mod I/Supervision. Therapy may not proceed with showering this patient. 9. Case  Management and Social Worker will assess and treat for psychological issues and discharge planning. 10. Team conference will be held weekly to assess progress toward goals and to determine barriers to discharge. 11. Patient will receive at least 3 hours of therapy per day at least 5 days per week. 12. ELOS: 16-19 days.       13. Prognosis:  good  Delice Lesch, MD  08/17/2015

## 2015-08-17 NOTE — PMR Pre-admission (Signed)
PMR Admission Coordinator Pre-Admission Assessment  Patient: Seth [name] is an 49 y.o., male MRN: [id number] DOB: 09-16-1966 Height: 5\' 8"  (172.7 cm) Weight: (!) 144.7 kg (319 lb 0.1 oz)              Insurance Information HMO: No    PPO:       PCP:       IPA:       80/20:       OTHER:   PRIMARY:  Medicare A/B      Policy#: [id number]      Subscriber: Rosendo Gros CM Name:        Phone#:       Fax#:   Pre-Cert#:        Employer: Not employed Benefits:  Phone #:       Name: Checked in [place]. Date: 07/10/12     Deduct: $1288      Out of Pocket Max: none      Life Max: unlimited CIR: 100%      SNF: 100 days Outpatient: 80%     Co-Pay: 20% Home Health: 100%      Co-Pay: none DME: 80%     Co-Pay: 20% Providers: patient's choice  Medicaid Application Date:        Case Manager:   Disability Application Date:        Case Worker:    Emergency Contact Information Contact Information    Name Relation Home Work Mobile   Brock,Theresa Mother [phone]  [phone]   Riley,Robin Relative [phone]       Current Medical History  Patient Admitting Diagnosis: Cord compression secondary to Osteomyelitis/diskitis T4 and T5 with pathologic fracture    History of Present Illness:A 49 y.o. male with history of DM type 2, ESRD, OSA, recent Staph lugdunensis treated with 2 weeks of IV antibiotics. He was admitted on 08/10/15 with three week history of upper back pain progressing to BLE weakness with tingling and difficulty walking. MRI thoracic spine done revealing discitis-osteomyelitis at T4-5 with endplate destructive changes and vertebral body height loss and retropulsion of inferior margin of T4 and superior margin of T5 into spinal canal with moderate stenosis and cord compression. Patient was taken to OR on 11/02 for T4-T5 decompressive laminectomy with T4 and T5 transpedicular corpectomy and arthrodesis by Dr. Annette Stable. Did not tolerate extubation in PACU requiring re-intubation and  placed on Cefzolin for 6 weeks IV therapy per ID input. He tolerated extubation on 11/03 and PT evaluation done this am. Pt has weakness in his RLE post-op.   Past Medical History  Past Medical History  Diagnosis Date  . Hypertension   . ESRD (end stage renal disease) on dialysis ([place])   . 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 Sleep apnea     USES CPAP  . Diabetes mellitus     INSULIN DEPENDENT DIABETES  . Discitis 07/2015    Family History  family history includes Diabetes in his brother, mother, and sister.  Prior Rehab/Hospitalizations: He had some HHPT 2 weeks ago for weak legs.  Has the patient had major surgery during 100 days prior to admission? No.  However, had back surgery during current hospitalization.  Current Medications   Current facility-administered medications:  .  acetaminophen (TYLENOL) tablet 650 mg, 650 mg, Oral, Q6H PRN, Javier Glazier, MD .  albuterol (PROVENTIL) (2.5 MG/3ML) 0.083% nebulizer solution 2.5 mg, 2.5  mg, Nebulization, Q3H PRN, Rahul P Desai, PA-C .  amLODipine (NORVASC) tablet 10 mg, 10 mg, Oral, Daily, Brand Males, MD, 10 mg at 08/16/15 0810 .  antiseptic oral rinse solution (CORINZ), 7 mL, Mouth Rinse, QID, Chesley Mires, MD, 7 mL at 08/16/15 0000 .  aspirin chewable tablet 81 mg, 81 mg, Oral, Daily, Chesley Mires, MD, 81 mg at 08/16/15 0810 .  calcium acetate (PHOSLO) capsule 1,334 mg, 1,334 mg, Oral, TID WC, Lauren D Bajbus, RPH, 1,334 mg at 08/17/15 0809 .  ceFAZolin (ANCEF) IVPB 2 g/50 mL premix, 2 g, Intravenous, Q M,W,F-HD, Jake Church Masters, RPH, 2 g at 08/14/15 1630 .  chlorhexidine gluconate (PERIDEX) 0.12 % solution 15 mL, 15 mL, Mouth Rinse, BID, Chesley Mires, MD, 15 mL at 08/17/15 0809 .  cholecalciferol (VITAMIN D) tablet 1,000 Units, 1,000 Units, Oral, Daily, Tasrif Ahmed, MD, 1,000 Units at 08/16/15 0810 .  fentaNYL (SUBLIMAZE) injection 12.5 mcg, 12.5 mcg, Intravenous, Q2H  PRN, Brand Males, MD, 12.5 mcg at 08/14/15 0923 .  heparin injection 5,000 Units, 5,000 Units, Subcutaneous, 3 times per day, Riccardo Dubin, MD, 5,000 Units at 08/17/15 0600 .  insulin aspart (novoLOG) injection 0-20 Units, 0-20 Units, Subcutaneous, 6 times per day, Rahul P Desai, PA-C, 3 Units at 08/17/15 0809 .  oxyCODONE-acetaminophen (PERCOCET/ROXICET) 5-325 MG per tablet 1 tablet, 1 tablet, Oral, Q6H PRN, Brand Males, MD, 1 tablet at 08/17/15 0808 .  pantoprazole (PROTONIX) EC tablet 40 mg, 40 mg, Oral, QHS, Chesley Mires, MD, 40 mg at 08/16/15 2125 .  sodium chloride 0.9 % injection 10-40 mL, 10-40 mL, Intracatheter, Q12H, Nischal Narendra, MD, 10 mL at 08/17/15 1107 .  sodium chloride 0.9 % injection 10-40 mL, 10-40 mL, Intracatheter, PRN, Aldine Contes, MD, 10 mL at 08/17/15 0418 .  traMADol (ULTRAM) tablet 50 mg, 50 mg, Oral, Q12H, Brand Males, MD, 50 mg at 08/17/15 1110  Patients Current Diet: Diet renal/carb modified with fluid restriction Diet-HS Snack?: Nothing; Room service appropriate?: Yes; Fluid consistency:: Thin  Precautions / Restrictions Precautions Precautions: Fall, Back Precaution Booklet Issued: Yes (comment) Precaution Comments: reviewed back precautions Restrictions Weight Bearing Restrictions: No   Has the patient had 2 or more falls or a fall with injury in the past year?No  Prior Activity Level Limited Community (1-2x/wk): Went out 1-2 X a week most recently.  Home Assistive Devices / Equipment Home Assistive Devices/Equipment: CBG Meter  Prior Device Use: Indicate devices/aids used by the patient prior to current illness, exacerbation or injury? [employer]  Prior Functional Level Prior Function Level of Independence: Independent  Self Care: Did the patient need help bathing, dressing, using the toilet or eating?  Independent  Indoor Mobility: Did the patient need assistance with walking from room to room (with or without  device)? Independent  Stairs: Did the patient need assistance with internal or external stairs (with or without device)? Needed some help  Functional Cognition: Did the patient need help planning regular tasks such as shopping or remembering to take medications? Needed some help  Current Functional Level Cognition  Overall Cognitive Status: Within Functional Limits for tasks assessed Orientation Level: Oriented X4    Extremity Assessment (includes Sensation/Coordination)  Upper Extremity Assessment: Overall WFL for tasks assessed  Lower Extremity Assessment: Defer to PT evaluation    ADLs  Overall ADL's : Needs assistance/impaired Eating/Feeding: Independent, Sitting Grooming: Wash/dry face, Oral care, Sitting, Bed level Grooming Details (indicate cue type and reason): pt educated in two cup method for oral  care to assist with maintaining back precautions General ADL Comments: Pt educated in adaptive equipment for ADLs. Pt educated in back precautions and techniques for completing ADLs and safety.    Mobility  Overal bed mobility: Needs Assistance Bed Mobility: Rolling, Sidelying to Sit, Sit to Supine Rolling: +2 for physical assistance, Max assist Sidelying to sit: +2 for physical assistance, Max assist Sit to sidelying: +2 for physical assistance, Max assist General bed mobility comments: Pt reported pain with bed mobility. Pt required verbal and tactile cues for log roll technique and constant encouragement to complete.    Transfers  Overall transfer level: Needs assistance Equipment used: 2 person hand held assist Transfer via Lift Equipment: Stedy Transfers: Sit to/from Stand Sit to Stand: +2 physical assistance, Total assist General transfer comment: Attempted to come to stand x2 with bed slightly elevated and pt only able to achieve squat position with both trials. Pt reporting Rt LE pain and weakness, no noted buckling in Rt LE    Ambulation / Gait / Stairs / Wheelchair  Mobility       Posture / Balance Dynamic Sitting Balance Sitting balance - Comments: Feel UE A more related to pain than balance. Balance Overall balance assessment: Needs assistance Sitting-balance support: Bilateral upper extremity supported, Feet supported Sitting balance-Leahy Scale: Poor Sitting balance - Comments: Feel UE A more related to pain than balance. Postural control: Left lateral lean Standing balance support: Bilateral upper extremity supported Standing balance-Leahy Scale: Zero    Special needs/care consideration BiPAP/CPAP Yes, has CPAP in hospital and at home. CPM No Continuous Drip IV KVO Dialysis Yes        DaysM-W-F Life Vest No Oxygen No Special Bed No Trach Size No Wound Vac (area) No Skin Back incision                             Bowel mgmt: Last BM 08/12/15 Bladder mgmt: Says he voids daily usually during a BM. Diabetic mgmt Yes, on insulin at home.    Previous Home Environment Living Arrangements: Other relatives Home Care Services: No Additional Comments: Pt lives with sister, nephew and son. Pt reports he was driving prior to admission  Discharge Living Setting Plans for Discharge Living Setting: Patient's home, House, Lives with (comment) (Sister, son and nephew live with the patient.) Type of Home at Discharge: House Discharge Home Layout: One level Discharge Home Access: Stairs to enter Entrance Stairs-Number of Steps: 3 at side porch entrance. Does the patient have any problems obtaining your medications?: No  Social/Family/Support Systems Patient Roles: Parent, Other (Comment) (Sister and his 37 yo son and his 25 yo nephew live with him.) Contact Information: Denyse Amass - mother [phone] Anticipated Caregiver: Sister, son and a male friend Ability/Limitations of Caregiver: Sister works.  Male friend stops in to assist. Caregiver Availability: Intermittent Discharge Plan Discussed with Primary Caregiver: Yes Is Caregiver In  Agreement with Plan?: Yes Does Caregiver/Family have Issues with Lodging/Transportation while Pt is in Rehab?: No  Goals/Additional Needs Patient/Family Goal for Rehab: PT/OT supervision and min assist goals Expected length of stay: 16-19 days Cultural Considerations: None Dietary Needs: Renal, carb mod, thin liquids diet Equipment Needs: TBD Special Service Needs: Has HD M-W-F Pt/Family Agrees to Admission and willing to participate: Yes Program Orientation Provided & Reviewed with Pt/Caregiver Including Roles  & Responsibilities: Yes  Decrease burden of Care through IP rehab admission:  N/A  Possible need for SNF placement upon  discharge: yes, if he cannot progress to where he can be managed at home by family/friend.  Patient Condition: This patient's medical and functional status has changed since the consult dated: 08/14/15 in which the Rehabilitation Physician determined and documented that the patient's condition is appropriate for intensive rehabilitative care in an inpatient rehabilitation facility. See "History of Present Illness" (above) for medical update. Functional changes are: Currently requiring max assist for sit to stand, no ambulation yet. Patient's medical and functional status update has been discussed with the Rehabilitation physician and patient remains appropriate for inpatient rehabilitation. Will admit to inpatient rehab today.  Preadmission Screen Completed By:  Retta Diones, 08/17/2015 11:26 AM ______________________________________________________________________   Discussed status with Dr. Posey Pronto on 08/17/15 at 1126 and received telephone approval for admission today.  Admission Coordinator:  Retta Diones, time1126/Date11/07/16

## 2015-08-17 NOTE — Procedures (Signed)
CPAP set up and turned on for pt.  Pt will placed mask on self when ready.  Instructed to notify RT is assistance is needed.

## 2015-08-17 NOTE — Progress Notes (Signed)
Rehab admissions - I met with patient this am.  Initially he was angry with me and did not understand my questions.  However, MD talked with patient and he is now apologetic and ageable to inpatient rehab admission later today.  He is scheduled for HD today.  Bed available today and will admit to acute inpatient rehab later today.  Call me for questions.  #[phone]

## 2015-08-17 NOTE — Progress Notes (Signed)
   Subjective: Patient feeling well, says he was able to sleep fine. States that his pain is under control. Denies any fevers. Objective: Vital signs in last 24 hours: Filed Vitals:   08/16/15 0805 08/16/15 1715 08/16/15 2039 08/16/15 2040  BP: 124/81 134/64 125/57   Pulse: 98 92 93   Temp: 98.3 F (36.8 C) 98.6 F (37 C) 98.7 F (37.1 C)   TempSrc: Oral Oral Oral   Resp: 17 18 20 18   Height:      Weight:   319 lb 0.1 oz (144.7 kg)   SpO2: 93% 92% 93%    Weight change: -1.5 oz (-0.043 kg)  Intake/Output Summary (Last 24 hours) at 08/17/15 0851 Last data filed at 08/17/15 0700  Gross per 24 hour  Intake    490 ml  Output     85 ml  Net    405 ml   General: resting in bed, pleasant Cardiac: RRR, no rubs, murmurs or gallops Pulm: difficult to assess due to body habitus/mobility, but clear to auscultation anteriorly Abd: soft, nontender, nondistended, BS present Ext: SCDs in place Neuro: alert and oriented X3   Assessment/Plan: Principal Problem:   Osteomyelitis of thoracic spine ([name]) Active Problems:   Morbid obesity ([place])   Controlled type 2 diabetes mellitus with chronic kidney disease on chronic dialysis ([place])   Cord compression ([name])   Back pain   Septic shock ([place])   Acute respiratory failure with hypoxia ([name])   Discitis of thoracic region  T4-T5 discitis/osteomyleitis: Post op day #5, s/p T4-T5 decompressive laminectomy, transpedicular corpectomy. Pain under control. On cefazolin based on prior Staph lugdunensis bacteremia. Cultures NGTD. Likely drain removal today. -Neurosurgery following, appreciate recommendations -Continue Cefazolin for 6 weeks course with HD sessions. Start date 11/3, course to be completed on 12/15 -TTE poorly visualized -Appreciate recommendations from Infectious Disease -Continue pain management -CIR today if bed available  ESRD on HD: MWF dialysis -Nephrology following, appreciate recommendations  HTN: Amlodipine 10 mg  daily  T2DM: SSI-resistant   Dispo: Disposition is deferred at this time, awaiting improvement of current medical problems. CIR recommended when bed available and medically stable.   The patient does have a current PCP (Zada Finders, MD) and does need an [facility] hospital follow-up appointment after discharge.     LOS: 6 days   Zada Finders, MD 08/17/2015, 8:51 AM

## 2015-08-17 NOTE — Discharge Instructions (Signed)
Your antibiotics will be continued with your Hemodialysis sessions for a total of 6 weeks with last administration likely on December 14th or 15th.

## 2015-08-17 NOTE — Progress Notes (Signed)
Admit: 08/10/2015 LOS: 6  55M ESRD MWF with T4-T5 pathological fracture presumed OM s/p surgical correction 08/12/15  Subjective:  No new events Planning for HD today, then going to rehab Frustrated regarding hospital things- timing of dialysis and move to rehab Arm feels better      11/06 0701 - 11/07 0700 In: 730 [P.O.:720; I.V.:10] Out: 74 [Urine:25; Drains:60]  Filed Weights   08/14/15 2032 08/15/15 2003 08/16/15 2039  Weight: 140.2 kg (309 lb 1.4 oz) 144.743 kg (319 lb 1.6 oz) 144.7 kg (319 lb 0.1 oz)    Scheduled Meds: . amLODipine  10 mg Oral Daily  . antiseptic oral rinse  7 mL Mouth Rinse QID  . aspirin  81 mg Oral Daily  . calcium acetate  1,334 mg Oral TID WC  .  ceFAZolin (ANCEF) IV  2 g Intravenous Q M,W,F-HD  . chlorhexidine gluconate  15 mL Mouth Rinse BID  . cholecalciferol  1,000 Units Oral Daily  . heparin  5,000 Units Subcutaneous 3 times per day  . insulin aspart  0-20 Units Subcutaneous 6 times per day  . pantoprazole  40 mg Oral QHS  . sodium chloride  10-40 mL Intracatheter Q12H  . traMADol  50 mg Oral Q12H   Continuous Infusions:   PRN Meds:.acetaminophen, albuterol, fentaNYL (SUBLIMAZE) injection, oxyCODONE-acetaminophen, sodium chloride  Current Labs: reviewed  B Cx x2 from 11/1: NGTD Surgical Cx 11/2: NGTD   Physical Exam:  Blood pressure 135/57, pulse 91, temperature 99.3 F (37.4 C), temperature source Oral, resp. rate 16, height 5\' 8"  (1.727 m), weight 144.7 kg (319 lb 0.1 oz), SpO2 96 %. GEN: NAD, obese, intubated ENT: NCAT EYES: EOMI CV: RRR, no rub PULM: CTAB ABD: s/nt/nd SKIN: No rashes/lesiosn EXT:No LEE RUE with mild edema, stable  Outpt HD Orders Unit: [facility] Days: MWF Time: 4h Dialyzer: Elisio EDW: 138kg K/Ca: 2/2.5 Access: AVF, RFA Needle Size: 14g BFR/DFR: 500/800 VDRA: Hectorol 4mcg qTx EPO: Epogen 5000 units qTx Heparin: 4500 IU at start, then infusion 1000 IU/hr.  A/P 1. ESRD:   1. MWF [place] via AVF- due later today  2. 11/4 Tx curtailed 2/2 infiltration in setting of starting new buttonholes; electrolytes have been stable, volume ok; let AVF rest until Monday  3. Holding heparin currently 2. Pathogical Fracture at T4 and T5 1. S/p laminectomy 11/2 2. Likely OM, Cx NGTD in blood and at spine 3. On cefazolin, recent Stah Lugdunesis bacteremia- abx will need to be continued at least 6 weeks 4. 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 Postoperative VDRF: now extubated and resolved 4. HTN/Vol: Aim for EDW, BP stable 5. Anemia: Hb dropping- add aranesp  6. MBD: cont PhosLo, calcitriol and cinacaclet,  1. PTH 407 2. Cont binder- increase to 3 with meals  Wilda Wetherell A  08/17/2015, 11:22 AM   Recent Labs Lab 08/12/15 0737  08/13/15 0105 08/14/15 0424 08/17/15 0355  NA 131*  < > 135 134* 129*  K 4.0  < > 4.3 4.6 4.4  CL 86*  --  97* 95* 92*  CO2 30  --  27 26 24   GLUCOSE 219*  < > 217* 157* 161*  BUN 35*  --  22* 32* 76*  CREATININE 9.72*  --  6.79* 9.24* 12.14*  CALCIUM 8.5*  < > 7.9* 7.7* 8.4*  PHOS 7.0*  --   --  7.0* 8.0*  < > = values in this interval not displayed.  Recent Labs Lab 08/10/15 2217 08/12/15 0737  08/14/15 0424 08/16/15 1520 08/17/15 0355  WBC 13.5* 10.0  --  12.9* 13.8* 13.4*  NEUTROABS 10.7* 7.3  --   --  11.6*  --   HGB 10.1* 9.6*  < > 9.3* 8.0* 7.7*  HCT 31.3* 30.0*  < > 28.4* 24.7* 24.1*  MCV 97.5 99.3  --  98.3 95.4 95.3  PLT 333 339  --  277 311 302  < > = values in this interval not displayed.

## 2015-08-17 NOTE — Progress Notes (Signed)
Postop day 5. Patient overall stable. Pain well controlled. Denies radicular pain. Lower extremities remain improved in strength and sensation. No new issues or problems. Currently undergoing hemodialysis.  Vertebral biopsy consistent with osteomyelitis. Cultures no growth.  Awake and alert. Oriented and appropriate. Motor and sensory function stable. Wound dressing clean and dry. Drain output low.  Overall stable. Will remove drain. Continue efforts at mobilization. Continue antibiotics.

## 2015-08-17 NOTE — Progress Notes (Signed)
[facility] called to get report for patient will be going to rehab [address] room # 10.

## 2015-08-17 NOTE — Procedures (Signed)
Patient was seen on dialysis and the procedure was supervised.  BFR 400  Via AVF BP is  137/74.   Patient appears to be tolerating treatment well  Seth [name] A 08/17/2015

## 2015-08-17 NOTE — Discharge Summary (Signed)
Name: Seth [name] MRN: [id number] DOB: 1966/08/03 49 y.o. PCP: Zada Finders, MD  Date of Admission: 08/10/2015  9:23 PM Date of Discharge: 08/17/2015 Attending Physician: Aldine Contes, MD  Discharge Diagnosis: 1. Osteomyelitis of thoracic spine Principal Problem:   Osteomyelitis of thoracic spine ([name]) Active Problems:   Morbid obesity ([place])   Controlled type 2 diabetes mellitus with chronic kidney disease on chronic dialysis ([place])   Cord compression ([name])   Back pain   Septic shock ([place])   Acute respiratory failure with hypoxia ([name])   Discitis of thoracic region  Discharge Medications:   Medication List    STOP taking these medications        diazepam 2 MG tablet  Commonly known as:  VALIUM     naproxen 500 MG tablet  Commonly known as:  NAPROSYN     predniSONE 20 MG tablet  Commonly known as:  DELTASONE     Vitamin D (Ergocalciferol) 50000 UNITS Caps capsule  Commonly known as:  DRISDOL      TAKE these medications        albuterol (2.5 MG/3ML) 0.083% nebulizer solution  Commonly known as:  PROVENTIL  Take 3 mLs (2.5 mg total) by nebulization every 6 (six) hours as needed for wheezing or shortness of breath.     amLODipine 10 MG tablet  Commonly known as:  NORVASC  Take 1 tablet (10 mg total) by mouth daily.     aspirin EC 81 MG tablet  Take 81 mg by mouth daily.     calcitRIOL 0.5 MCG capsule  Commonly known as:  ROCALTROL  Take 1 capsule (0.5 mcg total) by mouth every Monday, Wednesday, and Friday.     calcium acetate 667 MG capsule  Commonly known as:  PHOSLO  Take 2 capsules (1,334 mg total) by mouth 3 (three) times daily with meals.     calcium acetate 667 MG capsule  Commonly known as:  PHOSLO  Take 3 capsules (2,001 mg total) by mouth 3 (three) times daily with meals.     ceFAZolin 2-3 GM-% Solr  Commonly known as:  ANCEF  Inject 50 mLs (2 g total) into the vein every Monday, Wednesday, and Friday with hemodialysis.     cinacalcet 30 MG tablet  Commonly known as:  SENSIPAR  Take 1 tablet (30 mg total) by mouth daily with breakfast.  Start taking on:  08/18/2015     cyclobenzaprine 10 MG tablet  Commonly known as:  FLEXERIL  Take 1 tablet (10 mg total) by mouth 3 (three) times daily as needed for muscle spasms.     Darbepoetin Alfa 150 MCG/0.3ML Sosy injection  Commonly known as:  ARANESP  Inject 0.3 mLs (150 mcg total) into the vein every Monday with hemodialysis.     Darbepoetin Alfa 100 MCG/0.5ML Sosy injection  Commonly known as:  ARANESP  Inject 0.5 mLs (100 mcg total) into the vein every Monday with hemodialysis.     gabapentin 300 MG capsule  Commonly known as:  NEURONTIN  Take 1 capsule (300 mg total) by mouth 3 (three) times daily.     insulin aspart 100 UNIT/ML injection  Commonly known as:  novoLOG  Inject 0-15 Units into the skin 3 (three) times daily with meals. CBG < 70: drink orange juice and recheck glucose or use glucose tabs CBG > 400: call MD   CBG 70 - 120: 0 units CBG 121 - 150: 3 units CBG 151 - 200: 4 units CBG  201 - 250: 7 units CBG 251 - 300: 11 units CBG 301 - 350: 15 units CBG 351 - 400: 20 units     pantoprazole 40 MG tablet  Commonly known as:  PROTONIX  Take 1 tablet (40 mg total) by mouth at bedtime.     traMADol 50 MG tablet  Commonly known as:  ULTRAM  Take 1 tablet (50 mg total) by mouth every 12 (twelve) hours as needed. For back pain     VITAMIN D PO  Take 1 tablet by mouth daily.        Disposition and follow-up:   Mr.Avyukth B Liddy was discharged from [facility] in Stable condition.  At the hospital follow up visit please address:  1.  Osteomyelitis: Patient needs to continue Cefazolin antibiotics for at least 6 weeks duration with Hemodialysis sessions with anticipated end date of 12/14 - 12/15.  ESRD on HD: Continue MWF dialysis per Nephrology  2.  Labs / imaging needed at time of follow-up: consider weekly ESR, CRP for  normalization (may not decrease until 2 weeks after starting antibiotic therapy).  3.  Pending labs/ test needing follow-up: blood and abscess cultures  Follow-up Appointments: Follow-up Information    Follow up with Zada Finders, MD.   Specialty:  Internal Medicine   Contact information:   [address] [place] [address] [phone]       Discharge Instructions: Discharge Instructions    Call MD for:  difficulty breathing, headache or visual disturbances    Complete by:  As directed      Call MD for:  extreme fatigue    Complete by:  As directed      Call MD for:  persistant dizziness or light-headedness    Complete by:  As directed      Call MD for:  persistant nausea and vomiting    Complete by:  As directed      Call MD for:  redness, tenderness, or signs of infection (pain, swelling, redness, odor or green/yellow discharge around incision site)    Complete by:  As directed      Call MD for:  severe uncontrolled pain    Complete by:  As directed      Call MD for:  temperature >100.4    Complete by:  As directed      Diet - low sodium heart healthy    Complete by:  As directed      Increase activity slowly    Complete by:  As directed            Consultations: Treatment Team:  Earnie Larsson, MD Rexene Agent, MD Thayer Headings, MD PCCM  Procedures Performed:  Ct Thoracic Spine Wo Contrast  08/11/2015  CLINICAL DATA:  Initial evaluation for severe upper back pain with tingling in both legs, worse on the right. EXAM: CT THORACIC AND LUMBAR SPINE WITHOUT CONTRAST TECHNIQUE: Multidetector CT imaging of the thoracic and lumbar spine was performed without contrast. Multiplanar CT image reconstructions were also generated. COMPARISON:  None available. FINDINGS: CT THORACIC SPINE FINDINGS First rib-bearing vertebral body is labeled T1. There is abnormal osseous destruction with fragmentation of the T4 and T5 vertebral bodies (series 7, image 21). There is associated  severe height loss with osseous fragmentation. There is associated retropulsion of osseous fragments into the spinal canal by at least 8 mm. Additionally, there is probable abnormal soft tissue density within the ventral epidural space at the level  of T4 and T5. There is moderate to severe canal stenosis with probable cord compression. Cord appears displaced to the left. Extension into the posterior elements with erosive changes of the bilateral pedicles of T5, and left pedicle of T4. There is abnormal soft tissue about the T4 and T5 vertebral bodies within the paraspinous soft tissue as well. Erosive changes within the bilateral posteromedial fourth ribs. The T4-5 intervertebral disc space is largely obliterated. Findings most concerning for possible osteomyelitis discitis/infection. Underlying malignancy could also be considered. No other worrisome focal osseous lesions identified. Heterogeneous lucent lesion with sclerotic margins within the T9 vertebral body most consistent with a benign hemangioma. No other acute abnormality within the thoracic spine. No other significant degenerative changes appreciated. Paraspinous soft tissues otherwise within normal limits. CT LUMBAR SPINE FINDINGS Grade 1 anterolisthesis of L4 on L5. Transitional lumbosacral anatomy with partial sacralization of the L5 vertebral body. Vertebral bodies otherwise normally aligned. Vertebral body heights preserved. No fracture. Single 7 mm lucent lesion within the anterior aspect of L3, indeterminate, but of doubtful clinical significance. No other focal osseous lesions. Paraspinous soft tissues within normal limits. There is question of a a nonobstructive stone within the inferior pole the right kidney. No retroperitoneal adenopathy. L4-5: 4 mm anterolisthesis. No associated pars defect. Severe bilateral facet arthrosis. Diffuse disc bulge without definite focal disc herniation. Ligamentum flavum hypertrophy. Mild to moderate canal stenosis  with probable at least moderate bilateral foraminal narrowing. L5-S1: Degenerative changes present at the anterior aspect of the L5 vertebral body. Mild disc bulge without significant stenosis. IMPRESSION: CT THORACIC SPINE IMPRESSION 1. Destructive process centered at the T4-5 level with associated osseous destruction and fragmentation of both the T4 and T5 vertebral bodies with focal kyphosis of the thoracic spine. Primary concern is for possible osteomyelitis discitis/infection. Dialysis induced spondyloarthropathy could also be considered in this patient on dialysis. Possible malignancy could also conceivably have this appearance but is less favored given the lack of additional metastatic lesions. There is abnormal soft tissue density within the ventral epidural space at this level with secondary moderate to severe cord compression. Follow-up examination with dedicated MRI may be helpful for further evaluation. CT LUMBAR SPINE IMPRESSION: 1. No acute process within the lumbar spine. 2. Grade 1 anterolisthesis at L4-5 with associated disc bulge and facet arthrosis with moderate canal and bilateral foraminal stenosis. 3. Mild degenerative changes at L5-S1 without stenosis. 4. Transitional lumbosacral anatomy with partial sacralization of the L5 vertebral body. Careful correlation with numbering system on this study recommended prior to any potential surgical intervention. Critical Value/emergent results were called by telephone at the time of interpretation on 08/11/2015 at 1:59 am to Dr. Leonides Schanz , who verbally acknowledged these results. Electronically Signed   By: Jeannine Boga M.D.   On: 08/11/2015 02:12   Ct Lumbar Spine Wo Contrast  08/11/2015  CLINICAL DATA:  Initial evaluation for severe upper back pain with tingling in both legs, worse on the right. EXAM: CT THORACIC AND LUMBAR SPINE WITHOUT CONTRAST TECHNIQUE: Multidetector CT imaging of the thoracic and lumbar spine was performed without contrast.  Multiplanar CT image reconstructions were also generated. COMPARISON:  None available. FINDINGS: CT THORACIC SPINE FINDINGS First rib-bearing vertebral body is labeled T1. There is abnormal osseous destruction with fragmentation of the T4 and T5 vertebral bodies (series 7, image 21). There is associated severe height loss with osseous fragmentation. There is associated retropulsion of osseous fragments into the spinal canal by at least 8 mm. Additionally, there is  probable abnormal soft tissue density within the ventral epidural space at the level of T4 and T5. There is moderate to severe canal stenosis with probable cord compression. Cord appears displaced to the left. Extension into the posterior elements with erosive changes of the bilateral pedicles of T5, and left pedicle of T4. There is abnormal soft tissue about the T4 and T5 vertebral bodies within the paraspinous soft tissue as well. Erosive changes within the bilateral posteromedial fourth ribs. The T4-5 intervertebral disc space is largely obliterated. Findings most concerning for possible osteomyelitis discitis/infection. Underlying malignancy could also be considered. No other worrisome focal osseous lesions identified. Heterogeneous lucent lesion with sclerotic margins within the T9 vertebral body most consistent with a benign hemangioma. No other acute abnormality within the thoracic spine. No other significant degenerative changes appreciated. Paraspinous soft tissues otherwise within normal limits. CT LUMBAR SPINE FINDINGS Grade 1 anterolisthesis of L4 on L5. Transitional lumbosacral anatomy with partial sacralization of the L5 vertebral body. Vertebral bodies otherwise normally aligned. Vertebral body heights preserved. No fracture. Single 7 mm lucent lesion within the anterior aspect of L3, indeterminate, but of doubtful clinical significance. No other focal osseous lesions. Paraspinous soft tissues within normal limits. There is question of a a  nonobstructive stone within the inferior pole the right kidney. No retroperitoneal adenopathy. L4-5: 4 mm anterolisthesis. No associated pars defect. Severe bilateral facet arthrosis. Diffuse disc bulge without definite focal disc herniation. Ligamentum flavum hypertrophy. Mild to moderate canal stenosis with probable at least moderate bilateral foraminal narrowing. L5-S1: Degenerative changes present at the anterior aspect of the L5 vertebral body. Mild disc bulge without significant stenosis. IMPRESSION: CT THORACIC SPINE IMPRESSION 1. Destructive process centered at the T4-5 level with associated osseous destruction and fragmentation of both the T4 and T5 vertebral bodies with focal kyphosis of the thoracic spine. Primary concern is for possible osteomyelitis discitis/infection. Dialysis induced spondyloarthropathy could also be considered in this patient on dialysis. Possible malignancy could also conceivably have this appearance but is less favored given the lack of additional metastatic lesions. There is abnormal soft tissue density within the ventral epidural space at this level with secondary moderate to severe cord compression. Follow-up examination with dedicated MRI may be helpful for further evaluation. CT LUMBAR SPINE IMPRESSION: 1. No acute process within the lumbar spine. 2. Grade 1 anterolisthesis at L4-5 with associated disc bulge and facet arthrosis with moderate canal and bilateral foraminal stenosis. 3. Mild degenerative changes at L5-S1 without stenosis. 4. Transitional lumbosacral anatomy with partial sacralization of the L5 vertebral body. Careful correlation with numbering system on this study recommended prior to any potential surgical intervention. Critical Value/emergent results were called by telephone at the time of interpretation on 08/11/2015 at 1:59 am to Dr. Leonides Schanz , who verbally acknowledged these results. Electronically Signed   By: Jeannine Boga M.D.   On: 08/11/2015 02:12    Mr Thoracic Spine Wo Contrast  08/11/2015  CLINICAL DATA:  Increasing back pain in the upper back. EXAM: MRI THORACIC SPINE WITHOUT CONTRAST TECHNIQUE: Multiplanar, multisequence MR imaging of the thoracic spine was performed. No intravenous contrast was administered. COMPARISON:  None. FINDINGS: There is fluid signal in the T4-5 disc space with endplate destructive changes and vertebral body height loss at at T4 and T5 with associated marrow edema. There is mild paravertebral soft tissue phlegmon. There is no drainable paravertebral fluid collection. There is no definite epidural fluid collection. There is a broad-based disc bulge at T4-5 with retropulsion of the posterior  inferior margin of T4 and superior posterior margin of T5 resulting in moderate spinal stenosis and mildly deforming the thoracic spinal cord. There is edema in the posterior paravertebral soft tissues. There is thoracic spine kyphosis centered at T4-5. The alignment is anatomic. There is no acute fracture or static listhesis. The marrow signal is otherwise normal. The remainder of the thoracic spinal cord is normal in size and signal. The disc spaces are maintained. T1-T2: No significant disc protrusion, foraminal stenosis or central canal stenosis. T2-T3: No significant disc protrusion, foraminal stenosis or central canal stenosis. T3-T4: No significant disc protrusion, foraminal stenosis or central canal stenosis. T5-T6: No significant disc protrusion, foraminal stenosis or central canal stenosis. T6-T7: No significant disc protrusion, foraminal stenosis or central canal stenosis. T7-T8: No significant disc protrusion, foraminal stenosis or central canal stenosis. T8-T9: No significant disc protrusion, foraminal stenosis or central canal stenosis. T9-T10: No significant disc protrusion, foraminal stenosis or central canal stenosis. T10-T11: No significant disc protrusion, foraminal stenosis or central canal stenosis. T11-T12: No  significant disc protrusion, foraminal stenosis or central canal stenosis. IMPRESSION: 1. 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 Retropulsion of the inferior posterior margin of T4 and superior posterior margin of T5 impresses upon the spinal canal moderately narrowing the spinal canal and mildly deforming the thoracic spinal cord. There is no evidence of an epidural abscess. There is paravertebral mild phlegmonous changes and posterior paraspinal inflammatory changes. Electronically Signed   By: Kathreen Devoid   On: 08/11/2015 16:27   Dg Thoracic Spine 1 View  08/12/2015  CLINICAL DATA:  49 year old with discitis/osteomyelitis at T4-5. Debridement and fusion. EXAM: OPERATIVE THORACIC SPINE 1 VIEW(S) COMPARISON:  MRI thoracic spine 08/11/2015 and CT thoracic spine 08/10/2015. FINDINGS: Two AP spot images of the thoracic spine were obtained with the C-arm fluoroscopic device and submitted for interpretation postoperatively. These demonstrate bilateral pedicle screws at T1, T2, T3, T6 and T7. Osteolysis of T4 and T5 again noted. The radiologic technologist documented 39 sec of fluoroscopy time. IMPRESSION: Images obtained during debridement of discitis/osteomyelitis at T4-5 and thoracic spine fusion. Electronically Signed   By: Evangeline Dakin M.D.   On: 08/12/2015 22:51   Portable Chest Xray  08/14/2015  CLINICAL DATA:  Respiratory failure EXAM: PORTABLE CHEST 1 VIEW COMPARISON:  July 09, 2015 FINDINGS: There is mild atelectatic change in the right base. The lungs elsewhere clear. Heart is upper normal in size with pulmonary vascularity within normal limits. Central catheter tip is in the superior vena cava, just beyond the junction with the left innominate vein. No pneumothorax. There is postoperative change in the thoracic spine with multiple levels of pedicle screw and plate fixation. IMPRESSION: Mild atelectasis  right lower lobe. Lungs elsewhere clear. Central catheter as described without pneumothorax. Heart upper normal in size. Electronically Signed   By: Lowella Grip III M.D.   On: 08/14/2015 07:51   Dg C-arm 61-120 Min  08/12/2015  CLINICAL DATA:  49 year old with discitis/osteomyelitis at T4-5. Debridement and fusion. EXAM: OPERATIVE THORACIC SPINE 1 VIEW(S) COMPARISON:  MRI thoracic spine 08/11/2015 and CT thoracic spine 08/10/2015. FINDINGS: Two AP spot images of the thoracic spine were obtained with the C-arm fluoroscopic device and submitted for interpretation postoperatively. These demonstrate bilateral pedicle screws at T1, T2, T3, T6 and T7. Osteolysis of T4 and T5 again noted. The radiologic technologist documented 39 sec of fluoroscopy time. IMPRESSION: Images obtained during debridement of discitis/osteomyelitis at T4-5 and thoracic  spine fusion. Electronically Signed   By: Evangeline Dakin M.D.   On: 08/12/2015 22:51    2D Echo: TTE 08/16/15 - Procedure narrative: Transthoracic echocardiography. Image quality was poor. The study was technically difficult, as a result of poor acoustic windows, poor sound wave transmission, restricted patient mobility, and body habitus. Intravenous contrast (Definity) was administered. - Left ventricle: The cavity size was normal. Wall thickness was normal. Systolic function was vigorous. The estimated ejection fraction was in the range of 65% to 70%. Wall motion was normal; there were no regional wall motion abnormalities. Doppler parameters are consistent with abnormal left ventricular relaxation (grade 1 diastolic dysfunction).  Cardiac Cath: n/a  Admission HPI: Mr. Cebastian Neis is a 49 year old man with a past medical history of ESRD (MWF HD), T2DM, HTN, degenerative disc disease, and recent hospitalization with Staph lugdunesis bacteremia (pan-sensitive, cefazolin 2g MWF at HD 9/24 - 10/11, unknown source) who presents with  back pain. This started a day after his discharge on 10/4 while he was in outpatient physical therapy. While performing some exercises, he felt a pinching sensation in his back. Over time, it began to be associated with a tingling sensation that radiated to his chest anteriorly. He he had to be seen by a physician when he could no long walk long distance with a walker and started to have tingling in his lower extremities. He was seen on 10/31 in the [facility] clinic. Based on their findings in the [facility] clinic, an MRI of his back was ordered, and he was prescribed Gabapentin, which was moderately helpful. He found he could not tolerate an MRI due to claustrophobia, so a CT scan (thoracic, lumbar) was performed. A destructive process was identified at the T4-5 level consistent with possible osteomyelitis/discitis or malignancy and moderate to severe cord compression. In the ED, Dr. Trenton Gammon from Neurosurgery was called who will see the patient in consult and recommended an MRI and holding antibiotics. He was noted have a leukocytosis to 13.5 and blood cultures were drawn, Today, he denies any bowel or bladder incontinence, spasticity, or falls - with his only complaints per above. He also denies any fever, chills, night sweats, loss of appetite, chest pain, shortness of breath, sore throat, runny nose, nausea, vomiting, diarrhea, constipation, and dysuria. He does not smoke, drink alcohol, or use drugs, IV or otherwise.  Hospital Course by problem list: Principal Problem:   Osteomyelitis of thoracic spine ([place]) Active Problems:   Morbid obesity ([place])   Controlled type 2 diabetes mellitus with chronic kidney disease on chronic dialysis ([place])   Cord compression ([name])   Back pain   Septic shock ([place])   Acute respiratory failure with hypoxia ([name])   Discitis of thoracic region   Osteomyelitis of thoracic spine: Patient with one month of progressive back pain after recent hospital stay for Staph lugdunensis  bacteremia. He was found to have T4-T5 bony destruction per CT with moderate to severe canal stenosis. MRI of thoracic spine showed findings consistent with osteomyelitis-discitis at the T4-T5 level with retropulsion of inferior T4 and superior T5 moderately narrowing the spinal canal and deforming the thoracic spinal cord without evidence of epidural abscess. Underwent T4-T5 decompressive laminectomy with left T4 and T5 transpedicular orpectomy of T4 and T5 and Microdissection of T1-T7 posterior lateral arthrodesis on 08/12/15. Specimens and cultures obtained during surgery. Patient then started on Cefazolin for presumed Staph lugdunensis osteomyelitis administered during hemodialysis sessions. Patient required re-intubation post-op and admitted to ICU. Extubated following day  and transferred back to floor after another day in ICU. Patient did well and continued with HD sessions and transferred to Inpatient Rehab on post-op day #5.  ESRD on HD: Continued MWF dialysis per Nephrology.  T2DM: SSI-resistant  HTN: Continued home Amlodipine 10 mg  Discharge Vitals:   BP 137/74 mmHg  Pulse 94  Temp(Src) 97.5 F (36.4 C) (Oral)  Resp 16  Ht _0  (1.727 m)  Wt 317 lb 3.9 oz (143.9 kg)  BMI 48.25 kg/m2  SpO2 96%  Discharge Labs:  Results for orders placed or performed during the hospital encounter of 08/10/15 (from the past 24 hour(s))  CBC with Differential/Platelet     Status: Abnormal   Collection Time: 08/16/15  3:20 PM  Result Value Ref Range   WBC 13.8 (H) 4.0 - 10.5 K/uL   RBC 2.59 (L) 4.22 - 5.81 MIL/uL   Hemoglobin 8.0 (L) 13.0 - 17.0 g/dL   HCT 24.7 (L) 39.0 - 52.0 %   MCV 95.4 78.0 - 100.0 fL   MCH 30.9 26.0 - 34.0 pg   MCHC 32.4 30.0 - 36.0 g/dL   RDW 16.1 (H) 11.5 - 15.5 %   Platelets 311 150 - 400 K/uL   Neutrophils Relative % 84 %   Neutro Abs 11.6 (H) 1.7 - 7.7 K/uL   Lymphocytes Relative 7 %   Lymphs Abs 0.9 0.7 - 4.0 K/uL   Monocytes Relative 7 %   Monocytes Absolute  1.0 0.1 - 1.0 K/uL   Eosinophils Relative 2 %   Eosinophils Absolute 0.3 0.0 - 0.7 K/uL   Basophils Relative 0 %   Basophils Absolute 0.0 0.0 - 0.1 K/uL  Glucose, capillary     Status: Abnormal   Collection Time: 08/16/15  3:50 PM  Result Value Ref Range   Glucose-Capillary 164 (H) 65 - 99 mg/dL  Glucose, capillary     Status: Abnormal   Collection Time: 08/16/15  8:19 PM  Result Value Ref Range   Glucose-Capillary 142 (H) 65 - 99 mg/dL  Glucose, capillary     Status: Abnormal   Collection Time: 08/17/15 12:09 AM  Result Value Ref Range   Glucose-Capillary 137 (H) 65 - 99 mg/dL  Renal function panel     Status: Abnormal   Collection Time: 08/17/15  3:55 AM  Result Value Ref Range   Sodium 129 (L) 135 - 145 mmol/L   Potassium 4.4 3.5 - 5.1 mmol/L   Chloride 92 (L) 101 - 111 mmol/L   CO2 24 22 - 32 mmol/L   Glucose, Bld 161 (H) 65 - 99 mg/dL   BUN 76 (H) 6 - 20 mg/dL   Creatinine, Ser 12.14 (H) 0.61 - 1.24 mg/dL   Calcium 8.4 (L) 8.9 - 10.3 mg/dL   Phosphorus 8.0 (H) 2.5 - 4.6 mg/dL   Albumin 2.4 (L) 3.5 - 5.0 g/dL   GFR calc non Af Amer 4 (L) >60 mL/min   GFR calc Af Amer 5 (L) >60 mL/min   Anion gap 13 5 - 15  CBC     Status: Abnormal   Collection Time: 08/17/15  3:55 AM  Result Value Ref Range   WBC 13.4 (H) 4.0 - 10.5 K/uL   RBC 2.53 (L) 4.22 - 5.81 MIL/uL   Hemoglobin 7.7 (L) 13.0 - 17.0 g/dL   HCT 24.1 (L) 39.0 - 52.0 %   MCV 95.3 78.0 - 100.0 fL   MCH 30.4 26.0 - 34.0 pg   MCHC 32.0 30.0 -  36.0 g/dL   RDW 16.1 (H) 11.5 - 15.5 %   Platelets 302 150 - 400 K/uL  Glucose, capillary     Status: Abnormal   Collection Time: 08/17/15  4:44 AM  Result Value Ref Range   Glucose-Capillary 167 (H) 65 - 99 mg/dL  Glucose, capillary     Status: Abnormal   Collection Time: 08/17/15  8:03 AM  Result Value Ref Range   Glucose-Capillary 149 (H) 65 - 99 mg/dL   Comment 1 Notify RN   Glucose, capillary     Status: Abnormal   Collection Time: 08/17/15 11:21 AM  Result Value  Ref Range   Glucose-Capillary 154 (H) 65 - 99 mg/dL   Comment 1 Notify RN     Signed: Zada Finders, MD 08/17/2015, 1:59 PM    Services Ordered on Discharge: CIR Equipment Ordered on Discharge: none

## 2015-08-17 NOTE — Consult Note (Addendum)
Seth [name], [facility] Pharmacist Signed Pharmacy Progress Notes 08/17/2015 3:09 PM    Expand All Collapse All   ANTIBIOTIC CONSULT NOTE - Follow up  Pharmacy Consult for cefzolin Indication: osteomyelitis   No Known Allergies  Patient Measurements: Height: 5\' 8"  (172.7 cm) Weight: (!) 317 lb 3.9 oz (143.9 kg) IBW/kg (Calculated) : 68.4   Vital Signs: Temp: 97.5 F (36.4 C) (11/07 1241) Temp Source: Oral (11/07 1241) BP: 150/79 mmHg (11/07 1500) Pulse Rate: 93 (11/07 1500) Intake/Output from previous day: 11/06 0701 - 11/07 0700 In: 730 [P.O.:720; I.V.:10] Out: 56 [Urine:25; Drains:60] Intake/Output from this shift: Total I/O In: 600 [P.O.:600] Out: -   Labs:  Recent Labs (last 2 labs)      Recent Labs  08/16/15 1520 08/17/15 0355  WBC 13.8* 13.4*  HGB 8.0* 7.7*  PLT 311 302  CREATININE --  12.14*     Assessment: 49 yo male with ESRD and recent staph lugdunesis bacteremia, s/p 4 weeks of treatment, now initiating abx for osteomyelitis discitis. Pt underwent I&D and decompressive laminectomy at T4-T5 on 11/2. Patient transferred to inpatient Rehab unit.  9/24: BCx: Staph lugdunensis  11/1 blood: neg 11/1: op cx: neg  Ancef 11/2 >> (09/24/15)  Goal of Therapy:  Resolution of infection  Plan: -Will continue Cefazolin as started prior to transfer. -Cefazolin 2g IV qHD(currently MWF schedule) -follow any changes to HD schedule  Marthenia Rolling,  Pharm.D   08/17/2015,  6:31 PM

## 2015-08-17 NOTE — Interval H&P Note (Signed)
Seth [name] was admitted today to Inpatient Rehabilitation with the diagnosis of Cord compression secondary to Osteomyelitis/diskitis T4 and T5 with pathologic fracture.  The patient's history has been reviewed, patient examined, and there is no change in status.  Patient continues to be appropriate for intensive inpatient rehabilitation.  I have reviewed the patient's chart and labs.  Questions were answered to the patient's satisfaction. The PAPE has been reviewed and assessment remains appropriate.  Ankit Lorie Phenix 08/17/2015, 7:38 PM

## 2015-08-17 NOTE — Evaluation (Signed)
Occupational Therapy Evaluation Patient Details Name: Seth [name] MRN: [id number] DOB: [birth year] Today's Date: 08/17/2015    History of Present Illness pt presents with Pathologic T4-5 fxs now s/p T1 - T7 Posterior Fusion with T4-5 Decompression and Microdiscectomy.     Clinical Impression   Pt admitted to hospital due to reason stated above. Pt currently with functional limitiations due to deficits listed below (see OT problem list). Prior to admission pt was driving and independent with ADLs/IADLs. Pt currently requires set up to total assistance for ADLs. Pt was educated in back precautions and handout provided. Pt was able to recall 1 out of 3 using teach back method. Deferring all further OT needs to CIR, pt will benefit from skilled OT to increase his independence and safety with ADLs and balance.    Follow Up Recommendations  CIR    Equipment Recommendations  Other (comment) (TBD next venue)    Recommendations for Other Services       Precautions / Restrictions Precautions Precautions: Fall;Back Precaution Booklet Issued: Yes (comment) Precaution Comments: reviewed back precautions Restrictions Weight Bearing Restrictions: No      Mobility Bed Mobility Overal bed mobility: Needs Assistance Bed Mobility: Rolling;Sidelying to Sit;Sit to Supine Rolling: +2 for physical assistance;Max assist Sidelying to sit: +2 for physical assistance;Max assist     Sit to sidelying: +2 for physical assistance;Max assist General bed mobility comments: Pt reported pain with bed mobility. Pt required verbal and tactile cues for log roll technique and constant encouragement to complete.  Transfers Overall transfer level: Needs assistance   Transfers: Sit to/from Stand Sit to Stand: +2 physical assistance;Total assist         General transfer comment: Attempted to come to stand x2 with bed slightly elevated and pt only able to achieve squat position with both trials. Pt  reporting Rt LE pain and weakness.    Balance Overall balance assessment: Needs assistance Sitting-balance support: Bilateral upper extremity supported;Feet supported Sitting balance-Leahy Scale: Poor   Postural control: Left lateral lean Standing balance support: Bilateral upper extremity supported Standing balance-Leahy Scale: Zero                              ADL Overall ADL's : Needs assistance/impaired Eating/Feeding: Independent;Sitting   Grooming: Wash/dry face;Oral care;Sitting;Bed level Grooming Details (indicate cue type and reason): pt educated in two cup method for oral care to assist with maintaining back precautions                               General ADL Comments: Pt educated in adaptive equipment for ADLs. Pt educated in back precautions and techniques for completing ADLs and safety.     Vision     Perception     Praxis      Pertinent Vitals/Pain Pain Assessment: 0-10 Pain Score: 9  Pain Location: back and shoulder Pain Descriptors / Indicators: Aching;Grimacing Pain Intervention(s): Monitored during session;Repositioned;Patient requesting pain meds-RN notified     Hand Dominance Right   Extremity/Trunk Assessment Upper Extremity Assessment Upper Extremity Assessment: Overall WFL for tasks assessed   Lower Extremity Assessment Lower Extremity Assessment: Defer to PT evaluation   Cervical / Trunk Assessment Cervical / Trunk Assessment: Kyphotic   Communication Communication Communication: No difficulties   Cognition Arousal/Alertness: Awake/alert Behavior During Therapy: WFL for tasks assessed/performed Overall Cognitive Status: Within Functional Limits for tasks assessed  General Comments       Exercises       Shoulder Instructions      Home Living Family/patient expects to be discharged to:: Inpatient rehab Living Arrangements: Other relatives                                Additional Comments: Pt lives with sister, nephew and son. Pt reports he was driving prior to admission      Prior Functioning/Environment Level of Independence: Independent             OT Diagnosis: Generalized weakness;Acute pain   OT Problem List: Decreased strength;Decreased activity tolerance;Impaired balance (sitting and/or standing);Decreased safety awareness;Decreased knowledge of use of DME or AE;Pain   OT Treatment/Interventions:      OT Goals(Current goals can be found in the care plan section) Acute Rehab OT Goals Patient Stated Goal: to get out of bed Potential to Achieve Goals: Fair  OT Frequency:     Barriers to D/C:            Co-evaluation              End of Session Equipment Utilized During Treatment: Gait belt Nurse Communication: Patient requests pain meds  Activity Tolerance: Patient limited by pain Patient left: in bed;with call bell/phone within reach;with chair alarm set   Time: [id number] OT Time Calculation (min): 54 min Charges:  OT General Charges $OT Visit: 1 Procedure OT Evaluation $Initial OT Evaluation Tier I: 1 Procedure OT Treatments $Self Care/Home Management : 23-37 mins G-Codes:    Lin Landsman 08-25-15, 12:47 PM

## 2015-08-17 NOTE — Consult Note (Signed)
Seth [name], [facility] Pharmacist Signed Pharmacy Progress Notes 08/17/2015 3:09 PM    Expand All Collapse All   ANTIBIOTIC CONSULT NOTE - Follow up  Pharmacy Consult for cefzolin Indication: osteomyelitis   No Known Allergies  Patient Measurements: Height: 5\' 8"  (172.7 cm) Weight: (!) 317 lb 3.9 oz (143.9 kg) IBW/kg (Calculated) : 68.4   Vital Signs: Temp: 97.5 F (36.4 C) (11/07 1241) Temp Source: Oral (11/07 1241) BP: 150/79 mmHg (11/07 1500) Pulse Rate: 93 (11/07 1500) Intake/Output from previous day: 11/06 0701 - 11/07 0700 In: 730 [P.O.:720; I.V.:10] Out: 75 [Urine:25; Drains:60] Intake/Output from this shift: Total I/O In: 600 [P.O.:600] Out: -   Labs:  Recent Labs (last 2 labs)      Recent Labs  08/16/15 1520 08/17/15 0355  WBC 13.8* 13.4*  HGB 8.0* 7.7*  PLT 311 302  CREATININE --  12.14*     Assessment: 49 yo male with ESRD and recent staph lugdunesis bacteremia, s/p 4 weeks of treatment, now initiating abx for osteomyelitis discitis. Pt underwent I&D and decompressive laminectomy at T4-T5 on 11/2. Patient transferred to inpatient Rehab unit.  9/24: BCx: Staph lugdunensis  11/1 blood: neg 11/1: op cx: neg  Ancef 11/2 >> (09/24/15)  Goal of Therapy:  Resolution of infection  Plan: -Will continue Cefazolin as started prior to transfer. -Cefazolin 2g IV qHD(currently MWF schedule) -follow any changes to HD schedule  Marthenia Rolling,  Pharm.D   08/17/2015,  6:27 PM

## 2015-08-17 NOTE — Care Management Important Message (Signed)
Important Message  Patient Details  Name: Seth [name] MRN: [id number] Date of Birth: 1966/03/12   Medicare Important Message Given:  Yes-third notification given    Loann Quill 08/17/2015, 1:45 PM

## 2015-08-17 NOTE — Progress Notes (Signed)
ANTIBIOTIC CONSULT NOTE -  Follow up  Pharmacy Consult for cefzolin Indication: osteomyelitis   No Known Allergies  Patient Measurements: Height: 5\' 8"  (172.7 cm) Weight: (!) 317 lb 3.9 oz (143.9 kg) IBW/kg (Calculated) : 68.4   Vital Signs: Temp: 97.5 F (36.4 C) (11/07 1241) Temp Source: Oral (11/07 1241) BP: 150/79 mmHg (11/07 1500) Pulse Rate: 93 (11/07 1500) Intake/Output from previous day: 11/06 0701 - 11/07 0700 In: 730 [P.O.:720; I.V.:10] Out: 37 [Urine:25; Drains:60] Intake/Output from this shift: Total I/O In: 600 [P.O.:600] Out: -   Labs:  Recent Labs  08/16/15 1520 08/17/15 0355  WBC 13.8* 13.4*  HGB 8.0* 7.7*  PLT 311 302  CREATININE  --  12.14*   Assessment: 49 yo male with ESRD and recent staph lugdunesis bacteremia, s/p 4 weeks of treatment, now initiating abx for osteomyelitis discitis. Pt underwent I&D and decompressive laminectomy at T4-T5 on 11/2.  9/24: BCx: Staph lugdunensis  11/1 blood: neg 11/1: op cx: neg  Ancef 11/2 >> (09/24/15)  Goal of Therapy:  Resolution of infection  Plan:  -Cefazolin 2g IV qHD(currently MWF schedule) -follow any changes to HD schedule -stop date is not yet entered-patient to transfer to CIR today, enter after transfer orders  Lutie Pickler D. Hanaan Gancarz, PharmD, BCPS Clinical Pharmacist Pager: [phone] 08/17/2015 3:11 PM

## 2015-08-17 NOTE — H&P (View-Only) (Signed)
Physical Medicine and Rehabilitation Admission H&P    Chief Complaint  Patient presents with  . 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 male with history of DM type 2, ESRD, OSA, recent Staph lugdunensis treated with 2 weeks of IV antibiotics. He was admitted on 08/10/15 with three week history of upper back pain progressing to BLE weakness with tingling and difficulty walking. MRI thoracic spine done revealing discitis-osteomyelitis at T4-5 with endplate destructive changes and vertebral body height loss and retropulsion of inferior margin of T4 and superior margin of T5 into spinal canal with moderate stenosis and cord compression. Patient was taken to OR on 11/02 for T4-T5 decompressive laminectomy with T4 and T5 transpedicular corpectomy and arthrodesis by Dr. Annette Stable. Did not tolerate extubation in PACU requiring re-intubation and placed on Cefzolin for 6 weeks IV therapy per ID input. He tolerated extubation without difficulty on 11/03. 2D echo done for work up revealing EF 65-70% with no wall abnormality and grade 1 diastolic dysfunction. Pt has greatest weakness in his RLE post-op.    Review of Systems  Constitutional: Negative for malaise/fatigue.  HENT: Negative for hearing loss.   Eyes: Negative for blurred vision and double vision.  Respiratory: Negative for cough and shortness of breath.   Cardiovascular: Negative for chest pain, palpitations and leg swelling.  Gastrointestinal: Positive for constipation (No BM since surgery). Negative for heartburn and nausea.  Genitourinary: Negative for urgency.  Musculoskeletal: Positive for myalgias and back pain.  Skin: Negative for itching and rash.  Neurological: Positive for tingling, focal weakness and weakness. Negative for dizziness, sensory change and headaches.  Psychiatric/Behavioral: Negative for depression. The patient is nervous/anxious. The  patient does not have insomnia.   All other systems reviewed and are negative.     Past Medical History  Diagnosis Date  . Hypertension   . ESRD (end stage renal disease) on dialysis ([place])   . 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 Sleep apnea     USES CPAP  . Diabetes mellitus     INSULIN DEPENDENT DIABETES  . Discitis 07/2015    Past Surgical History  Procedure Laterality Date  . Av fistula placement  05/03/2012    Procedure: ARTERIOVENOUS (AV) FISTULA CREATION;  Surgeon: Mal [name], MD;  Location: [facility];  Service: Vascular;  Laterality: Right;  . Tracheostomy tube placement N/A 08/20/2013    Procedure: TRACHEOSTOMY Revision;  Surgeon: Melida Quitter, MD;  Location: [facility];  Service: ENT;  Laterality: N/A;  . Tee without cardioversion N/A 07/10/2015    Procedure: TRANSESOPHAGEAL ECHOCARDIOGRAM (TEE);  Surgeon: Josue Hector, MD;  Location: [facility] ENDOSCOPY;  Service: Cardiovascular;  Laterality: N/A;    Family History  Problem Relation Age of Onset  . Diabetes Mother   . Diabetes Sister   . Diabetes Brother     Social History:  Lives with sister and sons. He states he has 24/7 support at discharge. 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 Was independent without AD prior to last admission. Was using a wheelchair for a week PTA. He reports that he has never smoked. He has never used smokeless tobacco. Per reports that he drinks about 0.6 oz of alcohol per week. Per reports that he uses illicit drugs (Marijuana).    Allergies: No Known Allergies  Medications Prior to Admission  Medication Sig Dispense Refill  . amLODipine (NORVASC) 10 MG tablet Take 1 tablet (10 mg total) by mouth daily. 30 tablet 0  . aspirin EC 81 MG tablet Take 81 mg by mouth daily.    . calcium acetate (PHOSLO) 667 MG capsule Take 2 capsules (1,334 mg total) by mouth 3 (three) times daily with meals. 180 capsule 0  . Cholecalciferol  (VITAMIN D PO) Take 1 tablet by mouth daily.    . cyclobenzaprine (FLEXERIL) 10 MG tablet Take 1 tablet (10 mg total) by mouth 3 (three) times daily as needed for muscle spasms. 42 tablet 0  . insulin aspart (NOVOLOG) 100 UNIT/ML injection Inject 0-15 Units into the skin 3 (three) times daily with meals. CBG < 70: drink orange juice and recheck glucose or use glucose tabs CBG > 400: call MD   CBG 70 - 120: 0 units CBG 121 - 150: 3 units CBG 151 - 200: 4 units CBG 201 - 250: 7 units CBG 251 - 300: 11 units CBG 301 - 350: 15 units CBG 351 - 400: 20 units 1 vial 12  . albuterol (PROVENTIL) (2.5 MG/3ML) 0.083% nebulizer solution Take 3 mLs (2.5 mg total) by nebulization every 6 (six) hours as needed for wheezing or shortness of breath. (Patient not taking: Reported on 07/17/2015) 75 mL 12  . ceFAZolin (ANCEF) 2-3 GM-% SOLR Inject 50 mLs (2 g total) into the vein every Monday, Wednesday, and Friday with hemodialysis. (Patient not taking: Reported on 08/05/2015)    . Darbepoetin Alfa (ARANESP) 150 MCG/0.3ML SOSY injection Inject 0.3 mLs (150 mcg total) into the vein every Monday with hemodialysis. 1.68 mL 0  . diazepam (VALIUM) 2 MG tablet Take 1 tablet (2 mg total) by mouth every 8 (eight) hours as needed for muscle spasms. (Patient not taking: Reported on 07/17/2015) 30 tablet 0  . gabapentin (NEURONTIN) 300 MG capsule Take 1 capsule (300 mg total) by mouth 3 (three) times daily. 90 capsule 0  . naproxen (NAPROSYN) 500 MG tablet Take 1 tablet (500 mg total) by mouth 2 (two) times daily with a meal. (Patient not taking: Reported on 08/05/2015) 10 tablet 0  . predniSONE (DELTASONE) 20 MG tablet Take 2 tablets (40 mg total) by mouth daily. (Patient not taking: Reported on 07/17/2015) 10 tablet 0  . traMADol (ULTRAM) 50 MG tablet Take 1 tablet (50 mg total) by mouth every 6 (six) hours as needed. For back pain (Patient not taking: Reported on 08/05/2015) 15 tablet 0  . Vitamin D, Ergocalciferol, (DRISDOL)  50000 UNITS CAPS capsule Take 1 capsule (50,000 Units total) by mouth every 7 (seven) days. *takes on Friday* 30 capsule 0    Home: Home Living Family/patient expects to be discharged to:: Inpatient rehab Living Arrangements: Alone   Functional History: Prior Function Level of Independence: Independent  Functional Status:  Mobility: Bed Mobility Overal bed mobility: Needs Assistance, +2 for physical assistance Bed Mobility: Rolling, Sidelying to Sit, Sit to Sidelying Rolling: Mod assist, +2 for physical assistance Sidelying to sit: Mod assist, +2 for physical assistance Sit to sidelying: Max assist, +2 for physical assistance General bed mobility comments: cues for log roll technique and encouragement.  pt very painful with bed mobility.   Transfers Overall transfer level: Needs assistance Equipment used: 2 person hand held assist Transfers: Sit to/from Stand Sit to Stand: Max assist, +2 physical assistance, From elevated surface General transfer comment: Attempted to come to stand x2 with height of bed  elevated and pt only able to achieve squat position both times.  LEs blocked, but unable to extend Bil knees.        ADL:    Cognition: Cognition Overall Cognitive Status: Within Functional Limits for tasks assessed Orientation Level: Oriented X4 Cognition Arousal/Alertness: Awake/alert Behavior During Therapy: WFL for tasks assessed/performed Overall Cognitive Status: Within Functional Limits for tasks assessed   Blood pressure 135/57, pulse 91, temperature 99.3 F (37.4 C), temperature source Oral, resp. rate 16, height _0  (1.727 m), weight 144.7 kg (319 lb 0.1 oz), SpO2 96 %. Physical Exam  Nursing note and vitals reviewed. Constitutional: He is oriented to person, place, and time. He appears well-developed and well-nourished.  Morbidly obese male lying in bed. NAD.  HENT:  Head: Normocephalic and atraumatic.  Eyes: Conjunctivae and EOM are normal. Pupils are  equal, round, and reactive to light.  Neck: Normal range of motion. Neck supple.  Central line left neck  Cardiovascular: Normal rate and regular rhythm.   Respiratory: Effort normal and breath sounds normal. No respiratory distress. He has no wheezes.  GI: Soft. Bowel sounds are normal. He exhibits no distension. There is no tenderness.  Reducible umbilical hernia  Musculoskeletal: He exhibits no edema or tenderness.  Strength B/l UE 4+/5 throughout LLE hip flexion 2+/5, ankle dorsi/plantar flexion 4/5 RLE: hip flexion 2-/5, ankle dorsi/plantar flexion 3+/5   Neurological: He is alert and oriented to person, place, and time.  Speech clear.  Able to follow commands without difficulty.  He is alert and oriented to person, place, and time.  He has normal reflexes.  Intentional tremors BUE. Sensation intact to light touch   Skin: Skin is warm and dry.  Multiple healing scabs BLE. Dry flaky skin bilateral feet and thighs.   Psychiatric: He has a normal mood and affect. His speech is normal. Thought content normal. Cognition and memory are normal.    Results for orders placed or performed during the hospital encounter of 08/10/15 (from the past 48 hour(s))  Glucose, capillary     Status: Abnormal   Collection Time: 08/15/15 11:31 AM  Result Value Ref Range   Glucose-Capillary 165 (H) 65 - 99 mg/dL  Glucose, capillary     Status: Abnormal   Collection Time: 08/15/15  4:52 PM  Result Value Ref Range   Glucose-Capillary 165 (H) 65 - 99 mg/dL  Glucose, capillary     Status: Abnormal   Collection Time: 08/15/15  8:01 PM  Result Value Ref Range   Glucose-Capillary 172 (H) 65 - 99 mg/dL  Glucose, capillary     Status: None   Collection Time: 08/15/15 11:47 PM  Result Value Ref Range   Glucose-Capillary 86 65 - 99 mg/dL  Glucose, capillary     Status: Abnormal   Collection Time: 08/16/15  4:06 AM  Result Value Ref Range   Glucose-Capillary 153 (H) 65 - 99 mg/dL  Glucose, capillary      Status: Abnormal   Collection Time: 08/16/15  8:05 AM  Result Value Ref Range   Glucose-Capillary 153 (H) 65 - 99 mg/dL  Glucose, capillary     Status: Abnormal   Collection Time: 08/16/15 11:55 AM  Result Value Ref Range   Glucose-Capillary 173 (H) 65 - 99 mg/dL  CBC with Differential/Platelet     Status: Abnormal   Collection Time: 08/16/15  3:20 PM  Result Value Ref Range   WBC 13.8 (H) 4.0 - 10.5 K/uL   RBC 2.59 (L) 4.22 - 5.81  MIL/uL   Hemoglobin 8.0 (L) 13.0 - 17.0 g/dL   HCT 24.7 (L) 39.0 - 52.0 %   MCV 95.4 78.0 - 100.0 fL   MCH 30.9 26.0 - 34.0 pg   MCHC 32.4 30.0 - 36.0 g/dL   RDW 16.1 (H) 11.5 - 15.5 %   Platelets 311 150 - 400 K/uL   Neutrophils Relative % 84 %   Neutro Abs 11.6 (H) 1.7 - 7.7 K/uL   Lymphocytes Relative 7 %   Lymphs Abs 0.9 0.7 - 4.0 K/uL   Monocytes Relative 7 %   Monocytes Absolute 1.0 0.1 - 1.0 K/uL   Eosinophils Relative 2 %   Eosinophils Absolute 0.3 0.0 - 0.7 K/uL   Basophils Relative 0 %   Basophils Absolute 0.0 0.0 - 0.1 K/uL  Glucose, capillary     Status: Abnormal   Collection Time: 08/16/15  3:50 PM  Result Value Ref Range   Glucose-Capillary 164 (H) 65 - 99 mg/dL  Glucose, capillary     Status: Abnormal   Collection Time: 08/16/15  8:19 PM  Result Value Ref Range   Glucose-Capillary 142 (H) 65 - 99 mg/dL  Glucose, capillary     Status: Abnormal   Collection Time: 08/17/15 12:09 AM  Result Value Ref Range   Glucose-Capillary 137 (H) 65 - 99 mg/dL  Renal function panel     Status: Abnormal   Collection Time: 08/17/15  3:55 AM  Result Value Ref Range   Sodium 129 (L) 135 - 145 mmol/L   Potassium 4.4 3.5 - 5.1 mmol/L   Chloride 92 (L) 101 - 111 mmol/L   CO2 24 22 - 32 mmol/L   Glucose, Bld 161 (H) 65 - 99 mg/dL   BUN 76 (H) 6 - 20 mg/dL   Creatinine, Ser 12.14 (H) 0.61 - 1.24 mg/dL   Calcium 8.4 (L) 8.9 - 10.3 mg/dL   Phosphorus 8.0 (H) 2.5 - 4.6 mg/dL   Albumin 2.4 (L) 3.5 - 5.0 g/dL   GFR calc non Af Amer 4 (L) >60 mL/min     GFR calc Af Amer 5 (L) >60 mL/min    Comment: (NOTE) The eGFR has been calculated using the CKD EPI equation. This calculation has not been validated in all clinical situations. eGFR's persistently <60 mL/min signify possible Chronic Kidney Disease.    Anion gap 13 5 - 15  CBC     Status: Abnormal   Collection Time: 08/17/15  3:55 AM  Result Value Ref Range   WBC 13.4 (H) 4.0 - 10.5 K/uL   RBC 2.53 (L) 4.22 - 5.81 MIL/uL   Hemoglobin 7.7 (L) 13.0 - 17.0 g/dL   HCT 24.1 (L) 39.0 - 52.0 %   MCV 95.3 78.0 - 100.0 fL   MCH 30.4 26.0 - 34.0 pg   MCHC 32.0 30.0 - 36.0 g/dL   RDW 16.1 (H) 11.5 - 15.5 %   Platelets 302 150 - 400 K/uL  Glucose, capillary     Status: Abnormal   Collection Time: 08/17/15  4:44 AM  Result Value Ref Range   Glucose-Capillary 167 (H) 65 - 99 mg/dL  Glucose, capillary     Status: Abnormal   Collection Time: 08/17/15  8:03 AM  Result Value Ref Range   Glucose-Capillary 149 (H) 65 - 99 mg/dL   Comment 1 Notify RN    No results found.     Medical Problem List and Plan: 1. Functional deficits secondary to Cord compression secondary to Osteomyelitis/diskitis T4  and T5 with pathologic fracture 2.  DVT Prophylaxis/Anticoagulation: Pharmaceutical: Lovenox 3. Pain Management: Continue tramadol bid with oxycodone prn. 4. Mood: LCSW to follow for evaluation and support.  5. Neuropsych: This patient is capable of making decisions on his own behalf. 6. Skin/Wound Care: Monitor wound daily.   7. Fluids/Electrolytes/Nutrition: Monitor I/O.  ON renal diet with 1200 cc/FR.  8. ESRD: On HD MWF. Schedule sessions later in afternoons to help with therapy tolerance.  9. HTN: Monitor BID. Continue Norvasc.   10. Anemia of chronic disease: Has had drop in Hgb to 7.7. Was on aranesp weekly PTA. Renal following of input on transfusion and treatment. Monitor for symptoms.  11. DM type 2:  Monitor BS ac/hs and use SSI for elevated BS.  12. OSA:  Continue use of CPAP.     Post Admission Physician Evaluation: 1. Functional deficits secondary  to Cord compression secondary to Osteomyelitis/diskitis T4 and T5 with pathologic fracture 2. Patient is admitted to receive collaborative, interdisciplinary care between the physiatrist, rehab nursing staff, and therapy team. 3. Patient's level of medical complexity and substantial therapy needs in context of that medical necessity cannot be provided at a lesser intensity of care such as a SNF. 4. Patient has experienced substantial functional loss from his/her baseline which was documented above under the "Functional History" and "Functional Status" headings.  Judging by the patient's diagnosis, physical exam, and functional history, the patient has potential for functional progress which will result in measurable gains while on inpatient rehab.  These gains will be of substantial and practical use upon discharge  in facilitating mobility and self-care at the household level. 5. Physiatrist will provide 24 hour management of medical needs as well as oversight of the therapy plan/treatment and provide guidance as appropriate regarding the interaction of the two. 6. 24 hour rehab nursing will assist with safety, skin/wound care, disease management, medication administration, pain management and patient education and help integrate therapy concepts, techniques,education, etc. 7. PT will assess and treat for/with: Lower extremity strength, range of motion, stamina, balance, functional mobility, safety, adaptive techniques and equipment, woundcare, coping skills, pain control, disease specific and general health education.   Goals are: Min A/Supervision. 8. OT will assess and treat for/with: ADL's, functional mobility, safety, upper extremity strength, adaptive techniques and equipment, wound mgt, ego support, and community reintegration.   Goals are: Mod I/Supervision. Therapy may not proceed with showering this patient. 9. Case  Management and Social Worker will assess and treat for psychological issues and discharge planning. 10. Team conference will be held weekly to assess progress toward goals and to determine barriers to discharge. 11. Patient will receive at least 3 hours of therapy per day at least 5 days per week. 12. ELOS: 16-19 days.       13. Prognosis:  good  Delice Lesch, MD  08/17/2015

## 2015-08-17 NOTE — Progress Notes (Signed)
Retta Diones, RN Rehab Admission Coordinator Signed Physical Medicine and Rehabilitation PMR Pre-admission 08/17/2015 9:59 AM  Related encounter: ED to Hosp-Admission (Current) from 08/10/2015 in [place] Collapse All   PMR Admission Coordinator Pre-Admission Assessment  Patient: Seth [name] is an 49 y.o., male MRN: [id number] DOB: 1966-09-03 Height: 5\' 8"  (172.7 cm) Weight: (!) 144.7 kg (319 lb 0.1 oz)  Insurance Information HMO: No PPO: PCP: IPA: 80/20: OTHER:  PRIMARY: Medicare A/B Policy#: [id number] Subscriber: Rosendo Gros CM Name: Phone#: Fax#:  Pre-Cert#: Employer: Not employed Benefits: Phone #: Name: Checked in [place]. 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 male with history of DM type 2, ESRD, OSA, recent Staph lugdunensis treated with 2 weeks of IV antibiotics. He was admitted on 08/10/15 with three week history of upper back pain progressing to BLE weakness with tingling and difficulty walking. MRI thoracic spine done revealing  discitis-osteomyelitis at T4-5 with endplate destructive changes and vertebral body height loss and retropulsion of inferior margin of T4 and superior margin of T5 into spinal canal with moderate stenosis and cord compression. Patient was taken to OR on 11/02 for T4-T5 decompressive laminectomy with T4 and T5 transpedicular corpectomy and arthrodesis by Dr. Annette Stable. Did not tolerate extubation in PACU requiring re-intubation and placed on Cefzolin for 6 weeks IV therapy per ID input. He tolerated extubation on 11/03 and PT evaluation done this am. Pt has weakness in his RLE post-op.   Past Medical History  Past Medical History  Diagnosis Date  . Hypertension   . ESRD (end stage renal disease) on dialysis ([place])   . 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 Sleep apnea     USES CPAP  . Diabetes mellitus     INSULIN DEPENDENT DIABETES  . Discitis 07/2015    Family History  family history includes Diabetes in his brother, mother, and sister.  Prior Rehab/Hospitalizations: He had some HHPT 2 weeks ago for weak legs.  Has the patient had major surgery during 100 days prior to admission? No. However, had back surgery during current hospitalization.  Current Medications   Current facility-administered medications:  . acetaminophen (TYLENOL) tablet 650 mg, 650 mg, Oral, Q6H PRN, Javier Glazier, MD . albuterol (PROVENTIL) (2.5 MG/3ML) 0.083% nebulizer solution 2.5 mg, 2.5 mg, Nebulization, Q3H PRN, Rahul P Desai, PA-C . amLODipine (NORVASC) tablet 10 mg, 10 mg, Oral, Daily, Brand Males, MD, 10 mg at 08/16/15 0810 . antiseptic oral rinse solution (CORINZ), 7 mL, Mouth Rinse, QID, Chesley Mires, MD, 7 mL at 08/16/15 0000 . aspirin chewable tablet 81 mg, 81 mg, Oral, Daily, Chesley Mires, MD, 81 mg at 08/16/15 0810 . calcium acetate (PHOSLO) capsule 1,334 mg, 1,334 mg, Oral, TID WC, Lauren D Bajbus, RPH, 1,334  mg at 08/17/15 0809 . ceFAZolin (ANCEF) IVPB 2 g/50 mL  premix, 2 g, Intravenous, Q M,W,F-HD, Jake Church Masters, RPH, 2 g at 08/14/15 1630 . chlorhexidine gluconate (PERIDEX) 0.12 % solution 15 mL, 15 mL, Mouth Rinse, BID, Chesley Mires, MD, 15 mL at 08/17/15 0809 . cholecalciferol (VITAMIN D) tablet 1,000 Units, 1,000 Units, Oral, Daily, Tasrif Ahmed, MD, 1,000 Units at 08/16/15 0810 . fentaNYL (SUBLIMAZE) injection 12.5 mcg, 12.5 mcg, Intravenous, Q2H PRN, Brand Males, MD, 12.5 mcg at 08/14/15 0923 . heparin injection 5,000 Units, 5,000 Units, Subcutaneous, 3 times per day, Riccardo Dubin, MD, 5,000 Units at 08/17/15 0600 . insulin aspart (novoLOG) injection 0-20 Units, 0-20 Units, Subcutaneous, 6 times per day, Rahul P Desai, PA-C, 3 Units at 08/17/15 0809 . oxyCODONE-acetaminophen (PERCOCET/ROXICET) 5-325 MG per tablet 1 tablet, 1 tablet, Oral, Q6H PRN, Brand Males, MD, 1 tablet at 08/17/15 0808 . pantoprazole (PROTONIX) EC tablet 40 mg, 40 mg, Oral, QHS, Chesley Mires, MD, 40 mg at 08/16/15 2125 . sodium chloride 0.9 % injection 10-40 mL, 10-40 mL, Intracatheter, Q12H, Nischal Narendra, MD, 10 mL at 08/17/15 1107 . sodium chloride 0.9 % injection 10-40 mL, 10-40 mL, Intracatheter, PRN, Aldine Contes, MD, 10 mL at 08/17/15 0418 . traMADol (ULTRAM) tablet 50 mg, 50 mg, Oral, Q12H, Brand Males, MD, 50 mg at 08/17/15 1110  Patients Current Diet: Diet renal/carb modified with fluid restriction Diet-HS Snack?: Nothing; Room service appropriate?: Yes; Fluid consistency:: Thin  Precautions / Restrictions Precautions Precautions: Fall, Back Precaution Booklet Issued: Yes (comment) Precaution Comments: reviewed back precautions Restrictions Weight Bearing Restrictions: No   Has the patient had 2 or more falls or a fall with injury in the past year?No  Prior Activity Level Limited Community (1-2x/wk): Went out 1-2 X a week most recently.  Home Assistive Devices /  Equipment Home Assistive Devices/Equipment: CBG Meter  Prior Device Use: Indicate devices/aids used by the patient prior to current illness, exacerbation or injury? [employer]  Prior Functional Level Prior Function Level of Independence: Independent  Self Care: Did the patient need help bathing, dressing, using the toilet or eating? Independent  Indoor Mobility: Did the patient need assistance with walking from room to room (with or without device)? Independent  Stairs: Did the patient need assistance with internal or external stairs (with or without device)? Needed some help  Functional Cognition: Did the patient need help planning regular tasks such as shopping or remembering to take medications? Needed some help  Current Functional Level Cognition  Overall Cognitive Status: Within Functional Limits for tasks assessed Orientation Level: Oriented X4   Extremity Assessment (includes Sensation/Coordination)  Upper Extremity Assessment: Overall WFL for tasks assessed  Lower Extremity Assessment: Defer to PT evaluation    ADLs  Overall ADL's : Needs assistance/impaired Eating/Feeding: Independent, Sitting Grooming: Wash/dry face, Oral care, Sitting, Bed level Grooming Details (indicate cue type and reason): pt educated in two cup method for oral care to assist with maintaining back precautions General ADL Comments: Pt educated in adaptive equipment for ADLs. Pt educated in back precautions and techniques for completing ADLs and safety.    Mobility  Overal bed mobility: Needs Assistance Bed Mobility: Rolling, Sidelying to Sit, Sit to Supine Rolling: +2 for physical assistance, Max assist Sidelying to sit: +2 for physical assistance, Max assist Sit to sidelying: +2 for physical assistance, Max assist General bed mobility comments: Pt reported pain with bed mobility. Pt required verbal and tactile cues for log roll technique and constant encouragement to complete.     Transfers  Overall transfer level: Needs assistance  Equipment used: 2 person hand held Nurse, learning disability: Stedy Transfers: Sit to/from Stand Sit to Stand: +2 physical assistance, Total assist General transfer comment: Attempted to come to stand x2 with bed slightly elevated and pt only able to achieve squat position with both trials. Pt reporting Rt LE pain and weakness, no noted buckling in Rt LE    Ambulation / Gait / Stairs / Wheelchair Mobility       Posture / Balance Dynamic Sitting Balance Sitting balance - Comments: Feel UE A more related to pain than balance. Balance Overall balance assessment: Needs assistance Sitting-balance support: Bilateral upper extremity supported, Feet supported Sitting balance-Leahy Scale: Poor Sitting balance - Comments: Feel UE A more related to pain than balance. Postural control: Left lateral lean Standing balance support: Bilateral upper extremity supported Standing balance-Leahy Scale: Zero    Special needs/care consideration BiPAP/CPAP Yes, has CPAP in hospital and at home. CPM No Continuous Drip IV KVO Dialysis Yes DaysM-W-F Life Vest No Oxygen No Special Bed No Trach Size No Wound Vac (area) No Skin Back incision  Bowel mgmt: Last BM 08/12/15 Bladder mgmt: Says he voids daily usually during a BM. Diabetic mgmt Yes, on insulin at home.    Previous Home Environment Living Arrangements: Other relatives Home Care Services: No Additional Comments: Pt lives with sister, nephew and son. Pt reports he was driving prior to admission  Discharge Living Setting Plans for Discharge Living Setting: Patient's home, House, Lives with (comment) (Sister, son and nephew live with the patient.) Type of Home at Discharge: House Discharge Home Layout: One level Discharge Home Access: Stairs to enter Entrance Stairs-Number of Steps: 3 at side porch entrance. Does the patient have  any problems obtaining your medications?: No  Social/Family/Support Systems Patient Roles: Parent, Other (Comment) (Sister and his 44 yo son and his 75 yo nephew live with him.) Contact Information: Denyse Amass - mother [phone] Anticipated Caregiver: Sister, son and a male friend Ability/Limitations of Caregiver: Sister works. Male friend stops in to assist. Caregiver Availability: Intermittent Discharge Plan Discussed with Primary Caregiver: Yes Is Caregiver In Agreement with Plan?: Yes Does Caregiver/Family have Issues with Lodging/Transportation while Pt is in Rehab?: No  Goals/Additional Needs Patient/Family Goal for Rehab: PT/OT supervision and min assist goals Expected length of stay: 16-19 days Cultural Considerations: None Dietary Needs: Renal, carb mod, thin liquids diet Equipment Needs: TBD Special Service Needs: Has HD M-W-F Pt/Family Agrees to Admission and willing to participate: Yes Program Orientation Provided & Reviewed with Pt/Caregiver Including Roles & Responsibilities: Yes  Decrease burden of Care through IP rehab admission: N/A  Possible need for SNF placement upon discharge: yes, if he cannot progress to where he can be managed at home by family/friend.  Patient Condition: This patient's medical and functional status has changed since the consult dated: 08/14/15 in which the Rehabilitation Physician determined and documented that the patient's condition is appropriate for intensive rehabilitative care in an inpatient rehabilitation facility. See "History of Present Illness" (above) for medical update. Functional changes are: Currently requiring max assist for sit to stand, no ambulation yet. Patient's medical and functional status update has been discussed with the Rehabilitation physician and patient remains appropriate for inpatient rehabilitation. Will admit to inpatient rehab today.  Preadmission Screen Completed By: Retta Diones, 08/17/2015  11:26 AM ______________________________________________________________________  Discussed status with Dr. Posey Pronto on 08/17/15 at 1126 and received telephone approval for admission today.  Admission Coordinator: Retta Diones, time1126/Date11/07/16  Cosigned by: Ankit Lorie Phenix, MD at 08/17/2015 11:59 AM  Revision History     Date/Time User Provider Type Action   08/17/2015 11:59 AM Ankit Lorie Phenix, MD Physician Cosign   08/17/2015 11:27 AM Retta Diones, RN Rehab Admission Coordinator Sign

## 2015-08-18 ENCOUNTER — Inpatient Hospital Stay (HOSPITAL_COMMUNITY): Payer: Non-veteran care | Admitting: Occupational Therapy

## 2015-08-18 ENCOUNTER — Inpatient Hospital Stay (HOSPITAL_COMMUNITY): Payer: Medicare Other | Admitting: *Deleted

## 2015-08-18 ENCOUNTER — Inpatient Hospital Stay (HOSPITAL_COMMUNITY): Payer: Medicare Other | Admitting: Occupational Therapy

## 2015-08-18 ENCOUNTER — Encounter (HOSPITAL_COMMUNITY): Payer: Self-pay | Admitting: Neurosurgery

## 2015-08-18 DIAGNOSIS — Z992 Dependence on renal dialysis: Secondary | ICD-10-CM

## 2015-08-18 DIAGNOSIS — G9520 Unspecified cord compression: Secondary | ICD-10-CM

## 2015-08-18 DIAGNOSIS — D62 Acute posthemorrhagic anemia: Secondary | ICD-10-CM

## 2015-08-18 DIAGNOSIS — M4624 Osteomyelitis of vertebra, thoracic region: Principal | ICD-10-CM

## 2015-08-18 DIAGNOSIS — N186 End stage renal disease: Secondary | ICD-10-CM

## 2015-08-18 LAB — GLUCOSE, CAPILLARY: Glucose-Capillary: 186 mg/dL — ABNORMAL HIGH (ref 65–99)

## 2015-08-18 MED ORDER — HYDROCERIN EX CREA
TOPICAL_CREAM | Freq: Two times a day (BID) | CUTANEOUS | Status: DC
  Administered 2015-08-18: 1 via TOPICAL
  Administered 2015-08-18 – 2015-09-09 (×38): via TOPICAL
  Filled 2015-08-18 (×2): qty 113

## 2015-08-18 MED ORDER — SENNOSIDES-DOCUSATE SODIUM 8.6-50 MG PO TABS
2.0000 | ORAL_TABLET | Freq: Every day | ORAL | Status: DC
  Administered 2015-08-18 – 2015-09-08 (×19): 2 via ORAL
  Filled 2015-08-18 (×21): qty 2

## 2015-08-18 NOTE — Progress Notes (Signed)
Admit: 08/17/2015 LOS: 1  2M ESRD MWF with T4-T5 pathological fracture presumed OM s/p surgical correction 08/12/15  Subjective:  No new events- not sure he really understands what rehab is about     11/07 0701 - 11/08 0700 In: 40 [I.V.:40] Out: 220 [Urine:100; Drains:120]  Filed Weights   08/18/15 0500  Weight: 139 kg (306 lb 7 oz)    Scheduled Meds: . amLODipine  10 mg Oral Daily  . antiseptic oral rinse  7 mL Mouth Rinse QID  . aspirin  81 mg Oral Daily  . [START ON 08/19/2015] calcitRIOL  0.5 mcg Oral Q M,W,F  . calcium acetate  2,001 mg Oral TID WC  . [START ON 08/19/2015]  ceFAZolin (ANCEF) IV  2 g Intravenous Q M,W,F-HD  . chlorhexidine gluconate  15 mL Mouth Rinse BID  . cholecalciferol  1,000 Units Oral Daily  . cinacalcet  30 mg Oral Q breakfast  . [START ON 08/24/2015] darbepoetin (ARANESP) injection - DIALYSIS  100 mcg Intravenous Q Mon-HD  . enoxaparin (LOVENOX) injection  30 mg Subcutaneous Q24H  . hydrocerin   Topical BID  . pantoprazole  40 mg Oral QHS  . senna-docusate  2 tablet Oral QHS   Continuous Infusions:   PRN Meds:.acetaminophen, albuterol, bisacodyl, diphenhydrAMINE, guaiFENesin-dextromethorphan, oxyCODONE, promethazine **OR** promethazine **OR** promethazine, sodium chloride, sodium phosphate, sorbitol, traZODone  Current Labs: reviewed  B Cx x2 from 11/1: NGTD Surgical Cx 11/2: NGTD   Physical Exam:  Blood pressure 146/71, pulse 92, temperature 98 F (36.7 C), temperature source Oral, resp. rate 17, weight 139 kg (306 lb 7 oz), SpO2 92 %. GEN: NAD, obese, intubated ENT: NCAT EYES: EOMI CV: RRR, no rub PULM: CTAB ABD: s/nt/nd SKIN: No rashes/lesiosn EXT:No LEE RUE with mild edema, stable  Outpt HD Orders Unit: [facility] Days: MWF Time: 4h Dialyzer: Elisio EDW: 138kg K/Ca: 2/2.5 Access: AVF, RFA Needle Size: 14g BFR/DFR: 500/800 VDRA: Hectorol 34mcg qTx EPO: Epogen 5000 units qTx Heparin: 4500 IU at start,  then infusion 1000 IU/hr.  A/P 1. ESRD:  1. MWF [place] via AVF- due tomorrow 2. 11/4 Tx curtailed 2/2 infiltration in setting of starting new buttonholes; electrolytes have been stable, volume ok; AVF now seems fine 3. Holding heparin currently 2. Pathogical Fracture at T4 and T5 1. S/p laminectomy 11/2 2. Likely OM, Cx NGTD in blood and at spine 3. On cefazolin, recent Stah Lugdunesis bacteremia- abx will need to be continued at least 6 weeks 4. 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 Postoperative VDRF: now extubated and resolved 4. HTN/Vol: Aim for EDW, BP stable- requiring huge amounts of UF per tx 5. Anemia: Hb dropping- added aranesp  6. MBD: cont PhosLo, calcitriol and cinacaclet,  1. PTH 407 2. Cont binder- increased phoslo to 3 with meals  Massiah Minjares A  08/18/2015, 2:49 PM   Recent Labs Lab 08/12/15 0737  08/13/15 0105 08/14/15 0424 08/17/15 0355  NA 131*  < > 135 134* 129*  K 4.0  < > 4.3 4.6 4.4  CL 86*  --  97* 95* 92*  CO2 30  --  27 26 24   GLUCOSE 219*  < > 217* 157* 161*  BUN 35*  --  22* 32* 76*  CREATININE 9.72*  --  6.79* 9.24* 12.14*  CALCIUM 8.5*  < > 7.9* 7.7* 8.4*  PHOS 7.0*  --   --  7.0* 8.0*  < > = values in this interval  not displayed.  Recent Labs Lab 08/12/15 0737  08/14/15 0424 08/16/15 1520 08/17/15 0355  WBC 10.0  --  12.9* 13.8* 13.4*  NEUTROABS 7.3  --   --  11.6*  --   HGB 9.6*  < > 9.3* 8.0* 7.7*  HCT 30.0*  < > 28.4* 24.7* 24.1*  MCV 99.3  --  98.3 95.4 95.3  PLT 339  --  277 311 302  < > = values in this interval not displayed.

## 2015-08-18 NOTE — Progress Notes (Signed)
Social Work [name], male   DOB: Mar 08, 1966, 49 y.o.   MRN: [id number]   Seth Curb, LCSW Social Worker Signed  Patient Care Conference 08/18/2015  4:20 PM    Expand All Collapse All   Inpatient RehabilitationTeam Conference and Plan of Care Update Date: 08/18/2015   Time: 2:10 PM     Patient Name: Seth [name]       Medical Record Number: [id number]  Date of Birth: 06-18-1966 Sex: Male         Room/Bed: 4W10C/4W10C-01 Payor Info: Payor: MEDICARE / Plan: MEDICARE PART A AND B / Product Type: *No Product type* /    Admitting Diagnosis: 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  Osteomyelitis of thoracic spine ([place])  08/16/2015   .  Discitis of thoracic region     .  Septic shock ([place])  08/13/2015   .  Acute respiratory failure with hypoxia ([place])  08/13/2015   .  Cord compression ([place])  08/11/2015   .  Back pain  08/11/2015   .  Thoracic spine pain  08/11/2015   .  Muscle strain of chest wall  07/21/2015   .  Controlled type 2 diabetes mellitus with chronic kidney disease on chronic dialysis ([place])  07/20/2015   .  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  Absolute anemia     .  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  OSA (obstructive sleep apnea)  10/14/2013   .  Tracheostomy status ([place])  09/17/2013   .  Difficult airway for intubation  08/20/2013   .  End stage renal disease on dialysis ([place])  04/30/2012   .  Hypocalcemia  04/30/2012   .  Normocytic anemia  04/30/2012   .  DM II (diabetes mellitus, type II), controlled ([place])  06/20/2007   .  DYSLIPIDEMIA  06/20/2007   .  Morbid obesity ([place])  06/20/2007   .  Essential hypertension   06/20/2007     Expected Discharge Date: Expected Discharge Date: 09/09/15  Team Members Present: Physician leading conference: Dr. Alger Simons Social Worker Present: Lennart Pall, LCSW Nurse Present: Heather Roberts, RN PT Present: Canary Brim, PT OT Present: Napoleon Form, OT SLP Present: Weston Anna, SLP PPS Coordinator present : Daiva Nakayama, RN, CRRN        Current Status/Progress  Goal  Weekly Team Focus   Medical     t4 osteomyelitis with myelopathy. ESRD. having difficulty with pain  improve ability to perform self-care   pain mgt, cognitive rx?, wound care    Bowel/Bladder     Continent of bowel and bladder.   Pt to remain continent of bowel and bladder   Monitor   Swallow/Nutrition/ Hydration               ADL's     Max A+2 functional transfers, toileting, LB dressing; min A UB dressing; set-up grooming  min- mod overall  Sit <> stands in prep for toileting task and functional transfers; activity tolerance   Mobility     max assist to total +2  min to mod assist overall w/c  level   transfers, endurance, pain management, endurance, strengthening, sit to stands, balance   Communication               Safety/Cognition/ Behavioral Observations              Pain     Tramadol 50mg  scheduled, Oxy IR 10mg  q 4hrs   <5  Monitor for nonverbal cues of pain    Skin     Honey dressing to T4-T5 lamin, OTA, visible old blood, IJ cath to R IJ  No additional skin breakdown  Monitor and assess q shift    Rehab Goals Patient on target to meet rehab goals: Yes *See Care Plan and progress notes for long and short-term goals.    Barriers to Discharge:  premorbid sedentary living status? pain, cognition?     Possible Resolutions to Barriers:   continued NMR, adaptive equipment training, repeittion, care giver ed     Discharge Planning/Teaching Needs:   Pt reports he will dc home with family and friends providing "any assistance I need." - Still need to confirm  will schedule as  caregivers are confirmed    Team Discussion:    MD notes not as impaired as he would expect neurologically.  Having difficult time with therapies, however, and determining best options for moving, transferring, etc.  Initial eval today and needed as much as 3+ assist.  MD to trial a decreased on pain meds.  Tx hope to aim for min assist w/c level goals but likely to alter as clearer picture of abilities emerges.   Revisions to Treatment Plan:    NA    Continued Need for Acute Rehabilitation Level of Care: The patient requires daily medical management by a physician with specialized training in physical medicine and rehabilitation for the following conditions: Daily direction of a multidisciplinary physical rehabilitation program to ensure safe treatment while eliciting the highest outcome that is of practical value to the patient.: Yes Daily medical management of patient stability for increased activity during participation in an intensive rehabilitation regime.: Yes Daily analysis of laboratory values and/or radiology reports with any subsequent need for medication adjustment of medical intervention for : Neurological problems;Post surgical problems;Other  Seth [name], [place] 08/18/2015, 4:55 PM

## 2015-08-18 NOTE — Progress Notes (Signed)
[facility] PHYSICAL MEDICINE & REHABILITATION     PROGRESS NOTE    Subjective/Complaints: Had a reasonable night. Back sore but tolerable. Had an accident with urinal. Anxious to get up with therapies today.   ROS: Pt denies fever, rash/itching, headache, blurred or double vision, nausea, vomiting, abdominal pain, diarrhea, chest pain, shortness of breath, palpitations, dysuria, dizziness, bleeding, anxiety, or depression   Objective: Vital Signs: Blood pressure 146/71, pulse 92, temperature 98 F (36.7 C), temperature source Oral, resp. rate 17, weight 139 kg (306 lb 7 oz), SpO2 92 %. No results found.  Recent Labs  08/16/15 1520 08/17/15 0355  WBC 13.8* 13.4*  HGB 8.0* 7.7*  HCT 24.7* 24.1*  PLT 311 302    Recent Labs  08/17/15 0355  NA 129*  K 4.4  CL 92*  GLUCOSE 161*  BUN 76*  CREATININE 12.14*  CALCIUM 8.4*   CBG (last 3)   Recent Labs  08/17/15 1121 08/17/15 2034 08/18/15 0654  GLUCAP 154* 205* 186*    Wt Readings from Last 3 Encounters:  08/18/15 139 kg (306 lb 7 oz)  08/17/15 136.8 kg (301 lb 9.4 oz)  08/10/15 139.254 kg (307 lb)    Physical Exam:  Constitutional: He is oriented to person, place, and time. He appears well-developed and well-nourished.  Morbidly obese male lying in bed. NAD.  HENT:  Head: Normocephalic and atraumatic.  Eyes: Conjunctivae and EOM are normal. Pupils are equal, round, and reactive to light.  Neck: Normal range of motion. Neck supple.  Central line left neck remains in place Cardiovascular: Normal rate and regular rhythm.  Respiratory: Effort normal and breath sounds normal. No respiratory distress. He has no wheezes.  GI: Soft. Bowel sounds are normal. He exhibits no distension. There is no tenderness.  Reducible umbilical hernia  Musculoskeletal: He exhibits no edema or tenderness.  Strength B/l UE 4+/5 throughout LLE hip flexion 3/5, ankle dorsi/plantar flexion 4/5 RLE: hip flexion 3-/5, ankle  dorsi/plantar flexion 4-/5  Neurological: He is alert and oriented to person, place, and time.  Speech clear.  Able to follow commands without difficulty.  He is alert and oriented to person, place, and time.  He has normal reflexes.  Intentional tremors BUE. Sensation intact to light touchin all 4 limbs  Skin: Skin is warm and dry. Surgical site with honeycomb dressing, drain with minimal drainage. Multiple healing scabs BLE. Dry flaky skin bilateral feet and thighs.  Psychiatric: He has a normal mood and affect. His speech is normal. Thought content normal. Cognition and memory are normal.     Assessment/Plan: 1. Functional deficits secondary to T4 osteomyelitis with cord compression/myelopathy which require 3+ hours per day of interdisciplinary therapy in a comprehensive inpatient rehab setting. Physiatrist is providing close team supervision and 24 hour management of active medical problems listed below. Physiatrist and rehab team continue to assess barriers to discharge/monitor patient progress toward functional and medical goals.  Function:  Bathing Bathing position   Position: Bed  Bathing parts Body parts bathed by patient: Right arm, Left arm, Chest, Abdomen Body parts bathed by helper: Front perineal area, Buttocks, Right upper leg, Left upper leg, Right lower leg, Left lower leg, Back  Bathing assist        Upper Body Dressing/Undressing Upper body dressing   What is the patient wearing?: Pull over shirt/dress     Pull over shirt/dress - Perfomed by patient: Thread/unthread right sleeve, Thread/unthread left sleeve, Put head through opening Pull over shirt/dress - Perfomed  by helper: Pull shirt over trunk        Upper body assist        Lower Body Dressing/Undressing Lower body dressing   What is the patient wearing?: Underwear, Pants, Non-skid slipper socks, Shoes Underwear - Performed by patient: Thread/unthread right underwear leg Underwear -  Performed by helper: Thread/unthread left underwear leg, Pull underwear up/down   Pants- Performed by helper: Thread/unthread right pants leg, Thread/unthread left pants leg, Pull pants up/down   Non-skid slipper socks- Performed by helper: Don/doff right sock, Don/doff left sock       Shoes - Performed by helper: Don/doff right shoe, Don/doff left shoe, Fasten right, Fasten left          Lower body assist Assist for lower body dressing: 2 Advertising copywriter assist     Transfers Chair/bed transfer   Chair/bed transfer method: Lateral scoot Chair/bed transfer assist level: 2 helpers (3 helpers) Chair/bed transfer assistive device: Education officer, community Comprehension Comprehension assist level: Understands basic 25 - 49% of the time/ requires cueing 50 - 75% of the time  Expression Expression assist level: Expresses basic 75 - 89% of the time/requires cueing 10 - 24% of the time. Needs helper to occlude trach/needs to repeat words.  Social Interaction    Problem Solving    Memory Memory assist level: Complete Independence: No helper   Medical Problem List and Plan: 1. Functional deficits secondary to Cord compression secondary to Osteomyelitis/diskitis T4 and T5 with pathologic fracture 2. DVT Prophylaxis/Anticoagulation: Pharmaceutical: Lovenox to continue 3. Pain Management: Continue tramadol bid with oxycodone prn. Reasonable control at present. 4. Mood: LCSW to follow for evaluation and support.  5. Neuropsych: This patient is capable of making decisions on his own behalf. 6. Skin/Wound Care: Monitor wound daily.local care  7. Fluids/Electrolytes/Nutrition: Monitor I/O. ON renal diet with 1200 cc/FR.   -labs with HD 8. ESRD: On HD MWF. Schedule sessions later in afternoons to help with therapy tolerance.  9. HTN: Monitor BID. Continue Norvasc.  10. Anemia of  chronic disease: Has had drop in Hgb to 7.7. Was on aranesp weekly PTA. Renal following of input on transfusion and treatment. Monitor for symptoms with activity. 11. DM type 2: Monitor BS ac/hs and continue to use SSI for elevated BS.   -has had fair control at times---follow for pattern before making changes 12. OSA: Continue use of CPAP.  LOS (Days) 1 A FACE TO FACE EVALUATION WAS PERFORMED  Breeanne Oblinger T 08/18/2015 8:57 AM

## 2015-08-18 NOTE — Patient Care Conference (Signed)
Inpatient RehabilitationTeam Conference and Plan of Care Update Date: 08/18/2015   Time: 2:10 PM    Patient Name: Seth [name]      Medical Record Number: [id number]  Date of Birth: [birth year] Sex: Male         Room/Bed: 4W10C/4W10C-01 Payor Info: Payor: MEDICARE / Plan: MEDICARE PART A AND B / Product Type: *No Product type* /    Admitting Diagnosis: T4-5 cord compression with osteomyelitis diskitis   Admit Date/Time:  08/17/2015  5:57 PM Admission Comments: No comment available   Primary Diagnosis:  <principal problem not specified> Principal Problem: <principal problem not specified>  Patient Active Problem List   Diagnosis Date Noted  . 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 Osteomyelitis of thoracic spine ([place]) 08/16/2015  . Discitis of thoracic region   . Septic shock ([place]) 08/13/2015  . Acute respiratory failure with hypoxia ([place]) 08/13/2015  . Cord compression ([place]) 08/11/2015  . Back pain 08/11/2015  . Thoracic spine pain 08/11/2015  . Muscle strain of chest wall 07/21/2015  . Controlled type 2 diabetes mellitus with chronic kidney disease on chronic dialysis ([place]) 07/20/2015  . 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 Absolute anemia   . 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 OSA (obstructive sleep apnea) 10/14/2013  . Tracheostomy status ([place]) 09/17/2013  . Difficult airway for intubation 08/20/2013  . End stage renal disease on dialysis ([place]) 04/30/2012  . Hypocalcemia 04/30/2012  . Normocytic anemia 04/30/2012  . DM II (diabetes mellitus, type II), controlled ([place]) 06/20/2007  . DYSLIPIDEMIA 06/20/2007  . Morbid obesity ([place]) 06/20/2007  . Essential hypertension 06/20/2007    Expected Discharge Date: Expected Discharge Date: 09/09/15  Team Members Present: Physician leading conference: Dr. Alger Simons Social Worker Present: Lennart Pall, LCSW Nurse Present: Heather Roberts, RN PT Present: Canary Brim, PT OT Present: Napoleon Form, OT SLP  Present: Weston Anna, SLP PPS Coordinator present : Daiva Nakayama, RN, CRRN     Current Status/Progress Goal Weekly Team Focus  Medical   t4 osteomyelitis with myelopathy. ESRD. having difficulty with pain  improve ability to perform self-care  pain mgt, cognitive rx?, wound care   Bowel/Bladder   Continent of bowel and bladder.   Pt to remain continent of bowel and bladder  Monitor   Swallow/Nutrition/ Hydration             ADL's   Max A+2 functional transfers, toileting, LB dressing; min A UB dressing; set-up grooming  min- mod overall  Sit <> stands in prep for toileting task and functional transfers; activity tolerance   Mobility   max assist to total +2  min to mod assist overall w/c level  transfers, endurance, pain management, endurance, strengthening, sit to stands, balance   Communication             Safety/Cognition/ Behavioral Observations            Pain   Tramadol 50mg  scheduled, Oxy IR 10mg  q 4hrs  <5  Monitor for nonverbal cues of pain   Skin   Honey dressing to T4-T5 lamin, OTA, visible old blood, IJ cath to R IJ  No additional skin breakdown  Monitor and assess q shift    Rehab Goals Patient on target to meet rehab goals: Yes *See Care Plan and progress notes for long and short-term goals.  Barriers to Discharge: premorbid sedentary living status? pain, cognition?    Possible Resolutions to Barriers:  continued NMR, adaptive equipment training, repeittion, care  giver ed    Discharge Planning/Teaching Needs:  Pt reports he will dc home with family and friends providing "any assistance I need." - Still need to confirm  will schedule as caregivers are confirmed   Team Discussion:  MD notes not as impaired as he would expect neurologically.  Having difficult time with therapies, however, and determining best options for moving, transferring, etc.  Initial eval today and needed as much as 3+ assist.  MD to trial a decreased on pain meds.  Tx hope to aim for  min assist w/c level goals but likely to alter as clearer picture of abilities emerges.  Revisions to Treatment Plan:  NA   Continued Need for Acute Rehabilitation Level of Care: The patient requires daily medical management by a physician with specialized training in physical medicine and rehabilitation for the following conditions: Daily direction of a multidisciplinary physical rehabilitation program to ensure safe treatment while eliciting the highest outcome that is of practical value to the patient.: Yes Daily medical management of patient stability for increased activity during participation in an intensive rehabilitation regime.: Yes Daily analysis of laboratory values and/or radiology reports with any subsequent need for medication adjustment of medical intervention for : Neurological problems;Post surgical problems;Other  Glada Wickstrom, [place] 08/18/2015, 4:55 PM

## 2015-08-18 NOTE — Care Management Note (Signed)
[facility] Individual Statement of Services  Patient Name:  Seth [name]  Date:  08/18/2015  Welcome to the [facility].  Our goal is to provide you with an individualized program based on your diagnosis and situation, designed to meet your specific needs.  With this comprehensive rehabilitation program, you will be expected to participate in at least 3 hours of rehabilitation therapies Monday-Friday, with modified therapy programming on the weekends.  Your rehabilitation program will include the following services:  Physical Therapy (PT), Occupational Therapy (OT), 24 hour per day rehabilitation nursing, Therapeutic Recreaction (TR), Neuropsychology, Case Management (Social Worker), Rehabilitation Medicine, Nutrition Services and Pharmacy Services  Weekly team conferences will be held on Tuesdays to discuss your progress.  Your Social Worker will talk with you frequently to get your input and to update you on team discussions.  Team conferences with you and your family in attendance may also be held.  Expected length of stay: 3 weeks  Overall anticipated outcome: minimal assistance  Depending on your progress and recovery, your program may change. Your Social Worker will coordinate services and will keep you informed of any changes. Your Social Worker's name and contact numbers are listed  below.  The following services may also be recommended but are not provided by the [facility] will be made to provide these services after discharge if needed.  Arrangements include referral to agencies that provide these services.  Your insurance has been verified to be:  Medicare and VA Your primary doctor is:  Dr. Zada Finders  Pertinent information will be shared with your doctor and your insurance company.  Social  Worker:  [place], [place] or ([phone]   Information discussed with and copy given to patient by: Lennart Pall, 08/18/2015, 3:46 PM

## 2015-08-18 NOTE — Progress Notes (Signed)
Pt has CPAP set up bedside and states he will place himself on later.

## 2015-08-18 NOTE — Evaluation (Signed)
Physical Therapy Assessment and Plan  Patient Details  Name: Seth [name] MRN: [id number] Date of Birth: 1966-07-09  PT Diagnosis: Abnormal posture, Difficulty walking, Impaired sensation, Muscle weakness and Pain in back, shoulders, and lower extremities Rehab Potential: Good ELOS: 21-25 days   Today's Date: 08/18/2015 PT Individual Time: 0900-1000 PT Individual Time Calculation (min): 60 min    Problem List:  Patient Active Problem List   Diagnosis Date Noted  . 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 Osteomyelitis of thoracic spine ([place]) 08/16/2015  . Discitis of thoracic region   . Septic shock ([place]) 08/13/2015  . Acute respiratory failure with hypoxia ([place]) 08/13/2015  . Cord compression ([place]) 08/11/2015  . Back pain 08/11/2015  . Thoracic spine pain 08/11/2015  . Muscle strain of chest wall 07/21/2015  . Controlled type 2 diabetes mellitus with chronic kidney disease on chronic dialysis ([place]) 07/20/2015  . 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 Absolute anemia   . 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 OSA (obstructive sleep apnea) 10/14/2013  . Tracheostomy status ([place]) 09/17/2013  . Difficult airway for intubation 08/20/2013  . End stage renal disease on dialysis ([place]) 04/30/2012  . Hypocalcemia 04/30/2012  . Normocytic anemia 04/30/2012  . DM II (diabetes mellitus, type II), controlled ([place]) 06/20/2007  . DYSLIPIDEMIA 06/20/2007  . Morbid obesity ([place]) 06/20/2007  . Essential hypertension 06/20/2007    Past Medical History:  Past Medical History  Diagnosis Date  . Hypertension   . ESRD (end stage renal disease) on dialysis ([place])   . 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 Sleep apnea     USES CPAP  . Diabetes mellitus     INSULIN DEPENDENT DIABETES  . Discitis 07/2015   Past Surgical History:  Past Surgical History  Procedure Laterality Date  . Av fistula placement  05/03/2012     Procedure: ARTERIOVENOUS (AV) FISTULA CREATION;  Surgeon: Mal [name], MD;  Location: [facility];  Service: Vascular;  Laterality: Right;  . Tracheostomy tube placement N/A 08/20/2013    Procedure: TRACHEOSTOMY Revision;  Surgeon: Melida Quitter, MD;  Location: [facility];  Service: ENT;  Laterality: N/A;  . Tee without cardioversion N/A 07/10/2015    Procedure: TRANSESOPHAGEAL ECHOCARDIOGRAM (TEE);  Surgeon: Josue Hector, MD;  [facility] ENDOSCOPY;  Service: Cardiovascular;  Laterality: N/A;    Assessment & Plan Clinical Impression: Patient is a 49 y.o. year old male with recent admission to the hospital with history of DM type 2, ESRD, OSA, recent Staph lugdunensis treated with 2 weeks of IV antibiotics. He was admitted on 08/10/15 with three week history of upper back pain progressing to BLE weakness with tingling and difficulty walking. MRI thoracic spine done revealing discitis-osteomyelitis at T4-5 with endplate destructive changes and vertebral body height loss and retropulsion of inferior margin of T4 and superior margin of T5 into spinal canal with moderate stenosis and cord compression. Patient was taken to OR on 11/02 for T4-T5 decompressive laminectomy with T4 and T5 transpedicular corpectomy and arthrodesis by Dr. Annette Stable. Did not tolerate extubation in PACU requiring re-intubation and placed on Cefzolin for 6 weeks IV therapy per ID input. He tolerated extubation without difficulty on 11/03. 2D echo done for work up revealing EF 65-70% with no wall abnormality and grade 1 diastolic dysfunction. Pt has greatest weakness in his RLE post-op.  Patient transferred to CIR on 08/17/2015 .   Patient  currently requires +2 assistance with mobility secondary to muscle weakness, muscle joint tightness and body habitus, decreased cardiorespiratoy endurance and decreased sitting balance, decreased standing balance, decreased postural control, decreased balance strategies and difficulty maintaining  precautions.  Prior to hospitalization, patient was modified independent  with mobility and lived with Family in a House home.  Home access is 3Stairs to enter.  Patient will benefit from skilled PT intervention to maximize safe functional mobility, minimize fall risk and decrease caregiver burden for planned discharge home with 24 hour supervision.  Anticipate patient will benefit from follow up [place] at discharge.  PT - End of Session Activity Tolerance: Decreased this session (Pain limiting) Endurance Deficit: Yes PT Assessment Rehab Potential (ACUTE/IP ONLY): Good Barriers to Discharge: Inaccessible home environment PT Patient demonstrates impairments in the following area(s): Balance;Endurance;Motor;Pain;Skin Integrity;Sensory;Safety PT Transfers Functional Problem(s): Bed Mobility;Bed to Chair;Car;Furniture PT Locomotion Functional Problem(s): Ambulation;Wheelchair Mobility;Stairs PT Plan PT Intensity: Minimum of 1-2 x/day ,45 to 90 minutes PT Frequency: 5 out of 7 days PT Duration Estimated Length of Stay: 21-25 days PT Treatment/Interventions: Ambulation/gait training;Balance/vestibular training;Cognitive remediation/compensation;Community reintegration;Discharge planning;Disease management/prevention;DME/adaptive equipment instruction;Functional mobility training;Neuromuscular re-education;Pain management;Patient/family education;Psychosocial support;Skin care/wound management;Splinting/orthotics;Stair training;Therapeutic Activities;Therapeutic Exercise;UE/LE Strength taining/ROM;UE/LE Coordination activities;Wheelchair propulsion/positioning PT Transfers Anticipated Outcome(s): min assist w/c level; mod assist car PT Locomotion Anticipated Outcome(s): modified independent w/c mobility; mod assist gait with therapy PT Recommendation Follow Up Recommendations: Home health PT;24 hour supervision/assistance Patient destination: Home Equipment Recommended: To be determined;Wheelchair  (measurements);Wheelchair cushion (measurements) Equipment Details: Pt owns standard walker  Skilled Therapeutic Intervention Individual treatment initiated with focus on various transfer techniques (scoot pivot, sit to stands with Stedy lift, and slideboard transfer) with emphasis on pt performing as much of transfer as possible and determining safest method, dynamic sitting balance and scooting on edge of mat for carryover with transfers, sit to stands/partial sit to stand with Stedy lift from elevated mat table with +2 assist, bed mobility re-training, and addressing overall positioning. Using recliner at this time for best positioning and comfort of patient with pressure relieving cushion in recliner.   PT Evaluation Precautions/Restrictions Precautions Precautions: Fall;Back Restrictions Weight Bearing Restrictions: No Pain C/o pain in back, shoulders, and BLE - RN made aware and pain medication given. Home Living/Prior Functioning Home Living Available Help at Discharge: Family;Available 24 hours/day Type of Home: House Home Access: Stairs to enter [employer] of Steps: 3 Entrance Stairs-Rails: Left Home Layout: One level Additional Comments: Pt lives with sister, nephew and son. Pt reports he was driving to HD prior to admission  Lives With: Family Prior Function Level of Independence: Requires assistive device for independence;Independent with transfers;Independent with gait  Able to Take Stairs?: Yes Driving: Yes Comments: Pt inconsistent reporting on when he would use standard walker for mobility Vision/Perception  Perception Perception: Within Functional Limits  Cognition Comments: Pt slightly impulsive during evaluation and cues for problem solving. Some inconsistent reporting noted with history intake. Sensation Sensation Light Touch: Appears Intact Proprioception: Impaired by gross assessment Coordination Gross Motor Movements are Fluid and Coordinated:  No Motor  Motor Motor: Abnormal postural alignment and control;Motor impersistence  Trunk/Postural Assessment  Cervical Assessment Cervical Assessment: Within Functional Limits Thoracic Assessment Thoracic Assessment: Exceptions to [facility] (flexed posture; back precautions) Lumbar Assessment Lumbar Assessment: Exceptions to [facility] (posterior tilt - body habitus limiting assessment; back prec) Postural Control Postural Control: Deficits on evaluation (decreased balance; flexed posture and body habitus limiting)  Balance Balance Balance Assessed: Yes Static Sitting Balance Static Sitting - Level of Assistance:  5: Stand by assistance Dynamic Sitting Balance Dynamic Sitting - Level of Assistance: 4: Min assist Sitting balance - Comments: limited due to pain Static Standing Balance Static Standing - Level of Assistance: 1: +2 Total assist;Other (comment) (used Stedy lift) Extremity Assessment  RUE Assessment RUE Assessment: Exceptions to [facility] RUE AROM (degrees) Overall AROM Right Upper Extremity: Deficits (Required numerous trials to "warm Up" in order to achieve full ROM, however, was able to achieve. ) RUE Strength RUE Overall Strength: Deficits;Due to pain   RLE Assessment RLE Assessment: Exceptions to [facility] RLE Strength RLE Overall Strength Comments: grossly 3-/5; decreased control LLE Assessment LLE Assessment: Exceptions to [facility] LLE Strength LLE Overall Strength Comments: grossly 3+/5   See Function Navigator for Current Functional Status.   Refer to Care Plan for Long Term Goals  Recommendations for other services: None  Discharge Criteria: Patient will be discharged from PT if patient refuses treatment 3 consecutive times without medical reason, if treatment goals not met, if there is a change in medical status, if patient makes no progress towards goals or if patient is discharged from hospital.  The above assessment, treatment plan, treatment alternatives and goals were  discussed and mutually agreed upon: by patient  Juanna Cao, PT, DPT  08/18/2015, 12:18 PM

## 2015-08-18 NOTE — Progress Notes (Signed)
Social Work  Social Work Assessment and Plan  Patient Details  Name: Seth [name] MRN: [id number] Date of Birth: 1966-03-25  Today's Date: 08/18/2015  Problem List:  Patient Active Problem List   Diagnosis Date Noted  . 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 Osteomyelitis of thoracic spine ([place]) 08/16/2015  . Discitis of thoracic region   . Septic shock ([place]) 08/13/2015  . Acute respiratory failure with hypoxia ([place]) 08/13/2015  . Cord compression ([place]) 08/11/2015  . Back pain 08/11/2015  . Thoracic spine pain 08/11/2015  . Muscle strain of chest wall 07/21/2015  . Controlled type 2 diabetes mellitus with chronic kidney disease on chronic dialysis ([place]) 07/20/2015  . 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 Absolute anemia   . 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 OSA (obstructive sleep apnea) 10/14/2013  . Tracheostomy status ([place]) 09/17/2013  . Difficult airway for intubation 08/20/2013  . End stage renal disease on dialysis ([place]) 04/30/2012  . Hypocalcemia 04/30/2012  . Normocytic anemia 04/30/2012  . DM II (diabetes mellitus, type II), controlled ([place]) 06/20/2007  . DYSLIPIDEMIA 06/20/2007  . Morbid obesity ([place]) 06/20/2007  . Essential hypertension 06/20/2007   Past Medical History:  Past Medical History  Diagnosis Date  . Hypertension   . ESRD (end stage renal disease) on dialysis ([place])   . 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 Sleep apnea     USES CPAP  . Diabetes mellitus     INSULIN DEPENDENT DIABETES  . Discitis 07/2015   Past Surgical History:  Past Surgical History  Procedure Laterality Date  . Av fistula placement  05/03/2012    Procedure: ARTERIOVENOUS (AV) FISTULA CREATION;  Surgeon: Mal [name], MD;  Location: [place] [facility];  Service: Vascular;  Laterality: Right;  . Tracheostomy tube placement N/A 08/20/2013    Procedure: TRACHEOSTOMY Revision;  Surgeon: Melida Quitter, MD;   Location: [place];  Service: ENT;  Laterality: N/A;  . Tee without cardioversion N/A 07/10/2015    Procedure: TRANSESOPHAGEAL ECHOCARDIOGRAM (TEE);  Surgeon: Josue Hector, MD;  Location: [facility] ENDOSCOPY;  Service: Cardiovascular;  Laterality: N/A;   Social History:  reports that he has never smoked. He has never used smokeless tobacco. He reports that he drinks about 0.6 oz of alcohol per week. He reports that he uses illicit drugs (Marijuana).  Family / Support Systems Marital Status: Single Patient Roles: Parent Children: Pt reports sons, Seth [name] (Siren (26) live in the home with him. Other Supports: Also in the home are his sister, Seth [name] @ [phone] and her 9 yo son.  Notes friends, Seth [name] @ [phone] and Seth [name] @ [phone] are "...my 'in case of emergency' people" Anticipated Caregiver: Sister, son and a male friend Ability/Limitations of Caregiver: per pt, his sister is not currently working and he feels that she and friends can "pretty much be with me all the time." Caregiver Availability: 24/7 (need to confirm) Family Dynamics: Pt describes good relationship with family and friends, however, when asked who would be primary contact he quickly states, "Me.  I am the only one who needs to know what's going on."  Social History Preferred language: English Religion: Baptist Cultural Background: NA Read: Yes Write: Yes Employment Status: Disabled Date Retired/Disabled/Unemployed: "for awhile" Freight forwarder Issues: None Guardian/Conservator: None - per MD, pt is capable of making decisions on his own behalf.  Abuse/Neglect Physical Abuse: Denies Verbal Abuse: Denies Sexual Abuse: Denies Exploitation of patient/patient's resources: Denies Self-Neglect: Denies  Emotional Status Pt's affect, behavior adn adjustment status: Pt able to complete assessment interview, but self corrects multiple times while blaming  "medications I just took" for reason he is unclear at times.  He denies any s/s of any emotional distress or any concerns about home d/c.  No s/s of depression or anxiety noted or reported.  Will defer formal depression screen at this time, however ,will monitor need for neuropsychology referral. Recent Psychosocial Issues: None Pyschiatric History: None Substance Abuse History: None  Patient / Family Perceptions, Expectations & Goals Pt/Family understanding of illness & functional limitations: Pt able to give a general report on his cord compression/ need for surgery.  Good, basic understanding of his current functional limitations/ need for CIR. Premorbid pt/family roles/activities: Pt was completely independent and driving himself to HD. Anticipated changes in roles/activities/participation: Per min assist goals, pt will now require physical assist at home.  Need to confirme which family members will be his primary supports. Pt/family expectations/goals: "I need to be a lot stronger."  [employer]: Other (Comment) (HD @ [facility] on [address] Premorbid Home Care/DME Agencies: Other (Comment) ([facility] following just PTA) Transportation available at discharge: yes  Discharge Planning Living Arrangements: Other relatives Support Systems: Children, Other relatives, Friends/neighbors Type of Residence: Private residence Insurance Resources: [employer], Multimedia programmer (specify) ([place]) Financial Resources: [employer] Financial Screen Referred: No Living Expenses: Education officer, community Management: Patient Does the patient have any problems obtaining your medications?: No Home Management: pt and family Patient/Family Preliminary Plans: Pt intends to return to his home with family and friends providing any needed assistance. Barriers to Discharge: Steps, Family Support (3 STE;  need to confirm 24/7 availability) Social Work Anticipated Follow Up Needs: HH/OP Expected length  of stay: 21 days  Clinical Impression Very physically debilitated gentleman here following spinal surgery.  Stressing several times that he is his own primary support and vaguely answers that friends and family "will do anything I need" without confirming a primary caregiver.  He completes the interview assessment despite having to self correct at times (which he blames meds for causing his confusion).  Will need to confirm caregiver team as goals have been initially set for min assist overall.  Pt denies any s/s of any emotional distress - will monitor.  Follow for d/c planning and support needs.  Coy Vandoren 08/18/2015, 4:07 PM

## 2015-08-18 NOTE — Progress Notes (Signed)
Patient information reviewed and entered into eRehab system by Christie Viscomi, RN, CRRN, PPS Coordinator.  Information including medical coding and functional independence measure will be reviewed and updated through discharge.     Per nursing patient was given "Data Collection Information Summary for Patients in Inpatient Rehabilitation Facilities with attached "Privacy Act Statement-Health Care Records" upon admission.  

## 2015-08-18 NOTE — Evaluation (Signed)
Occupational Therapy Assessment and Plan  Patient Details  Name: Seth [name] MRN: [id number] Date of Birth: June 26, 1966  OT Diagnosis: abnormal posture, acute pain, cognitive deficits and pain in thoracic spine Rehab Potential: Rehab Potential (ACUTE ONLY): Good ELOS: 21-23 days   Today's Date: 08/18/2015 OT Individual Time: 7628-3151 and 1100-1220 OT Individual Time Calculation (min): 60 min   And 80 min  Problem List:  Patient Active Problem List   Diagnosis Date Noted  . 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 Osteomyelitis of thoracic spine ([place]) 08/16/2015  . Discitis of thoracic region   . Septic shock ([place]) 08/13/2015  . Acute respiratory failure with hypoxia ([place]) 08/13/2015  . Cord compression ([place]) 08/11/2015  . Back pain 08/11/2015  . Thoracic spine pain 08/11/2015  . Muscle strain of chest wall 07/21/2015  . Controlled type 2 diabetes mellitus with chronic kidney disease on chronic dialysis ([place]) 07/20/2015  . 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 Absolute anemia   . 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 OSA (obstructive sleep apnea) 10/14/2013  . Tracheostomy status ([place]) 09/17/2013  . Difficult airway for intubation 08/20/2013  . End stage renal disease on dialysis ([place]) 04/30/2012  . Hypocalcemia 04/30/2012  . Normocytic anemia 04/30/2012  . DM II (diabetes mellitus, type II), controlled ([place]) 06/20/2007  . DYSLIPIDEMIA 06/20/2007  . Morbid obesity ([place]) 06/20/2007  . Essential hypertension 06/20/2007    Past Medical History:  Past Medical History  Diagnosis Date  . Hypertension   . ESRD (end stage renal disease) on dialysis ([place])   . 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 Sleep apnea     USES CPAP  . Diabetes mellitus     INSULIN DEPENDENT DIABETES  . Discitis 07/2015   Past Surgical History:  Past Surgical History  Procedure Laterality Date  . Av fistula placement   05/03/2012    Procedure: ARTERIOVENOUS (AV) FISTULA CREATION;  Surgeon: Mal [name], MD;  Location: [place] [facility];  Service: Vascular;  Laterality: Right;  . Tracheostomy tube placement N/A 08/20/2013    Procedure: TRACHEOSTOMY Revision;  Surgeon: Melida Quitter, MD;  Location: [place];  Service: ENT;  Laterality: N/A;  . Tee without cardioversion N/A 07/10/2015    Procedure: TRANSESOPHAGEAL ECHOCARDIOGRAM (TEE);  Surgeon: Josue Hector, MD;  Location: [facility] ENDOSCOPY;  Service: Cardiovascular;  Laterality: N/A;    Assessment & Plan Clinical Impression: Seth [name] is a 49 y.o. male with history of DM type 2, ESRD, OSA, recent Staph lugdunensis treated with 2 weeks of IV antibiotics. He was admitted on 08/10/15 with three week history of upper back pain progressing to BLE weakness with tingling and difficulty walking. MRI thoracic spine done revealing discitis-osteomyelitis at T4-5 with endplate destructive changes and vertebral body height loss and retropulsion of inferior margin of T4 and superior margin of T5 into spinal canal with moderate stenosis and cord compression. Patient was taken to OR on 11/02 for T4-T5 decompressive laminectomy with T4 and T5 transpedicular corpectomy and arthrodesis by Dr. Annette Stable. Did not tolerate extubation in PACU requiring re-intubation and placed on Cefzolin for 6 weeks IV therapy per ID input. He tolerated extubation without difficulty on 11/03. 2D echo done for work up revealing EF 65-70% with no wall abnormality and grade 1 diastolic dysfunction. Pt has greatest weakness in his RLE post-op.  Patient transferred to CIR on 08/17/2015 .    Patient currently  requires max with basic self-care skills secondary to muscle weakness, decreased cardiorespiratoy endurance and decreased sitting balance, decreased standing balance, decreased postural control, decreased balance strategies and difficulty maintaining precautions.  Prior to hospitalization, patient could complete ADLs with  min, per pt report. However, unclear of reliability of facts at this time.   Patient will benefit from skilled intervention to decrease level of assist with basic self-care skills and increase independence with basic self-care skills prior to discharge home with care partner.  Anticipate patient will require moderate physical assestance and follow up home health.  OT - End of Session Activity Tolerance: Tolerates < 10 min activity, no significant change in vital signs Endurance Deficit: Yes OT Assessment Rehab Potential (ACUTE ONLY): Good OT Patient demonstrates impairments in the following area(s): Balance;Behavior;Endurance;Motor;Pain;Safety OT Basic ADL's Functional Problem(s): Bathing;Dressing;Toileting OT Transfers Functional Problem(s): Toilet;Tub/Shower OT Additional Impairment(s): Fuctional Use of Upper Extremity OT Plan OT Intensity: Minimum of 1-2 x/day, 45 to 90 minutes OT Frequency: 5 out of 7 days OT Duration/Estimated Length of Stay: 21-23 days OT Treatment/Interventions: Balance/vestibular training;Discharge planning;DME/adaptive equipment instruction;Functional mobility training;Patient/family education;Neuromuscular re-education;Therapeutic Activities;Self Care/advanced ADL retraining;Therapeutic Exercise;UE/LE Strength taining/ROM;UE/LE Coordination activities OT Self Feeding Anticipated Outcome(s): Independent OT Basic Self-Care Anticipated Outcome(s): Min A OT Toileting Anticipated Outcome(s): Mod A OT Bathroom Transfers Anticipated Outcome(s): Mod A OT Recommendation Patient destination: Home Follow Up Recommendations: Home health OT Equipment Recommended: To be determined   Skilled Therapeutic Intervention Pt seen for OT eval and ADL  Bathing/ dressing session. Pt in supine upon arrival, agreeable to tx session. +2 assist required to come to EOB, and pt able to maintain static sitting balance sitting unsupported on EOB. +3 required for lateral scoot to w/c. He  dressed seated in w/c, standing at sink with +2 assist to have pants pulled up. VCs and education provided for maintain spinal pre-cautions during functional tasks. He completed grooming tasks with set-up. He demonstrated ability to bring self upright off back of w/c, however, fatigues quickly and is unable to maintain unsupported static sitting for extended periods. Pt left sitting in w/c at end of session, all needs in reach. Pt educated throughout session regarding OT goals, POC, CIR, spinal pre-cautions, and d/c planning.  Session Two: Pt seen for OT therapy session focusing on UE strengthening, functional transfers, and ADL re-training. Pt sitting in recliner upon arrival, agreeable to tx session. He voiced pain in shoulders. With permission from PA, heat pack applied to B shoulders. Pt taken in recliner to therapy gym, as have not yet found w/c for proper size. In therapy gym, pt completed UE strengthening exercises using level I theraband with verbal and tactile cues for technique. Light PROM provided for B UEs. Emphasis on pt sitting up from w/c, to sit unsupported on edge of recliner in order to increase core strength/ stability.  In pt's room, completed sliding board transfer to drop arm BSC, min A for transfer with +2 assist available to stabilize equipment. Used combination of lateral leans and modified squat to assist with clothing management with +2 total A. Pt unable to maintain unsupported sitting on BSC, requiring steadying assist and frequent repositioning due to body habitus. +3 required for lateral scoot to return to recliner and manage clothing. Pt's drain pulled out during transfer, RN and PA made aware. Pt required max cuing throughout transfer for safety awareness, sequencing and technique. Pt left sitting in recliner at end of session, PA, RN, and NT present.  OT Evaluation Precautions/Restrictions  Precautions Precautions: Fall;Back Precaution  Comments: reviewed/ demonstrated  back precautions, however, pt with 0 carry over of education and unable to recall pre-cautions after 5 minutes Restrictions Weight Bearing Restrictions: No General Chart Reviewed: Yes Pain Pain Assessment Pain Assessment: 0-10 Pain Score: 10  Pain Location: Back Pain Intervention(s): RN made aware, repositioned Home Living/Prior [place] expects to be discharged to:: Private residence Living Arrangements: Other relatives Available Help at Discharge: Family, Available 24 hours/day Type of Home: House Home Access: Stairs to enter [employer] of Steps: 3 Entrance Stairs-Rails: Left Home Layout: One level Bathroom Shower/Tub: Walk-in shower Additional Comments: Pt lives with sister, nephew and son. Pt reports he was driving to HD prior to admission; unclear if he required physical assist PTA  Lives With: Family IADL History Current License: Yes Mode of Transportation: Car Occupation: On disability Prior Function Level of Independence: Requires assistive device for independence, Independent with transfers, Independent with gait, Needs assistance with ADLs, Needs assistance with homemaking  Able to Take Stairs?: Yes Driving: Yes Comments: Pt inconsistent reporting amount of assist required for ADLs Vision/Perception  Vision- History Baseline Vision/History: No visual deficits Patient Visual Report: No change from baseline  Cognition Overall Cognitive Status: Within Functional Limits for tasks assessed Arousal/Alertness: Awake/alert Orientation Level: Person;Place;Situation Person: Oriented Place: Oriented Situation: Oriented Year: 2016 Month: November Day of Week: Correct Memory: Appears intact Immediate Memory Recall: Sock;Blue;Bed Memory Recall: Sock;Blue;Bed Memory Recall Sock: Without Cue Memory Recall Blue: Without Cue Memory Recall Bed: Without Cue Attention: Sustained Focused Attention: Impaired Focused Attention  Impairment: Functional basic;Verbal basic Sustained Attention: Impaired Sustained Attention Impairment: Verbal basic;Functional basic Awareness: Impaired Awareness Impairment: Intellectual impairment Problem Solving: Impaired Problem Solving Impairment: Functional basic;Verbal complex Safety/Judgment: Impaired Comments: Pt slightly impulsive during evaluation and cues for problem solving. Some inconsistent reporting noted with history intake. Sensation Sensation Light Touch: Appears Intact Proprioception: Impaired by gross assessment Additional Comments: B LEs Coordination Gross Motor Movements are Fluid and Coordinated: No Fine Motor Movements are Fluid and Coordinated: Yes Motor  Motor Motor: Abnormal postural alignment and control;Motor impersistence Trunk/Postural Assessment  Cervical Assessment Cervical Assessment: Within Functional Limits Thoracic Assessment Thoracic Assessment: Exceptions to [facility] (Flexed posture; spinal pre-cautions) Lumbar Assessment Lumbar Assessment: Exceptions to [facility] (Extreme posterior tlit) Postural Control Postural Control: Deficits on evaluation (Decreased ability to maintain upright posture; body habitus limiting assessment)  Balance Static Sitting Balance Static Sitting - Level of Assistance: 5: Stand by assistance Dynamic Sitting Balance Dynamic Sitting - Level of Assistance: 4: Min assist Sitting balance - Comments: limited due to pain, posterior tilt, and inability to maintain upright position Static Standing Balance Static Standing - Level of Assistance: 1: +2 Total assist;Other (comment) (Standing at sink) Extremity/Trunk Assessment RUE Assessment RUE Assessment: Exceptions to [facility] RUE AROM (degrees) Overall AROM Right Upper Extremity: Deficits (required numerous "warm up" trials, however, able to complete full ROM following trials) RUE Strength RUE Overall Strength: Deficits;Due to pain LUE Assessment LUE Assessment: Exceptions to  [facility] LUE AROM (degrees) Overall AROM Left Upper Extremity: Deficits;Due to precautions;Due to pain (Able to achieve ~3/4 AROM) LUE Strength LUE Overall Strength: Deficits (3/5 shoulder flexion; 4/5 elbow flex/ext)   See Function Navigator for Current Functional Status.   Refer to Care Plan for Long Term Goals  Recommendations for other services: None  Discharge Criteria: Patient will be discharged from OT if patient refuses treatment 3 consecutive times without medical reason, if treatment goals not met, if there is a change in medical status, if patient makes no progress  towards goals or if patient is discharged from hospital.  The above assessment, treatment plan, treatment alternatives and goals were discussed and mutually agreed upon: by patient  Ernestina Patches 08/18/2015, 4:06 PM

## 2015-08-18 NOTE — Progress Notes (Signed)
Physical Therapy Session Note  Patient Details  Name: Seth [name] MRN: [id number] Date of Birth: 05-05-1966  Today's Date: 08/18/2015 PT Individual Time: 1300-1345 PT Individual Time Calculation (min): 45 min   Short Term Goals: Week 1:  PT Short Term Goal 1 (Week 1): Pt will be able to perform transfer with 1 person assist with LRAD PT Short Term Goal 2 (Week 1): Pt will be able to initiate w/c propulsion PT Short Term Goal 3 (Week 1): Pt will be able to perform sit to stand with +2 assist without use of lift equipment  Skilled Therapeutic Interventions/Progress Updates:  Session focused on functional standing, activity tolerance/endurance, and use of Sara Plus for transfer back to bed with +2 assist for safety and use of equipment. Addressed scooting in the recliner, upright tolerance, repositioning, and bed mobility. Cues for upright posture, hip and trunk extension, and UE positioning while in [employer].    Therapy Documentation Precautions:  Precautions Precautions: Fall, Back Restrictions Weight Bearing Restrictions: No Pain: Premedicated for back and shoulder pain.    See Function Navigator for Current Functional Status.   Therapy/Group: Individual Therapy and Co-Treatment with TR  Canary Brim Ivory Broad, PT, DPT  08/18/2015, 1:49 PM

## 2015-08-19 ENCOUNTER — Inpatient Hospital Stay (HOSPITAL_COMMUNITY): Payer: Medicare Other | Admitting: Physical Therapy

## 2015-08-19 ENCOUNTER — Inpatient Hospital Stay (HOSPITAL_COMMUNITY): Payer: Non-veteran care | Admitting: Occupational Therapy

## 2015-08-19 DIAGNOSIS — E1165 Type 2 diabetes mellitus with hyperglycemia: Secondary | ICD-10-CM

## 2015-08-19 DIAGNOSIS — E118 Type 2 diabetes mellitus with unspecified complications: Secondary | ICD-10-CM

## 2015-08-19 LAB — RENAL FUNCTION PANEL
Albumin: 2.6 g/dL — ABNORMAL LOW (ref 3.5–5.0)
Anion gap: 13 (ref 5–15)
BUN: 68 mg/dL — ABNORMAL HIGH (ref 6–20)
CALCIUM: 9 mg/dL (ref 8.9–10.3)
CO2: 24 mmol/L (ref 22–32)
CREATININE: 9.75 mg/dL — AB (ref 0.61–1.24)
Chloride: 94 mmol/L — ABNORMAL LOW (ref 101–111)
GFR calc non Af Amer: 5 mL/min — ABNORMAL LOW (ref >60)
GFR, EST AFRICAN AMERICAN: 6 mL/min — AB (ref >60)
GLUCOSE: 156 mg/dL — AB (ref 65–99)
Phosphorus: 4.7 mg/dL — ABNORMAL HIGH (ref 2.5–4.6)
Potassium: 3.9 mmol/L (ref 3.5–5.1)
SODIUM: 131 mmol/L — AB (ref 135–145)

## 2015-08-19 LAB — CBC
HCT: 25.3 % — ABNORMAL LOW (ref 39.0–52.0)
Hemoglobin: 8.1 g/dL — ABNORMAL LOW (ref 13.0–17.0)
MCH: 30.1 pg (ref 26.0–34.0)
MCHC: 32 g/dL (ref 30.0–36.0)
MCV: 94.1 fL (ref 78.0–100.0)
PLATELETS: 354 10*3/uL (ref 150–400)
RBC: 2.69 MIL/uL — ABNORMAL LOW (ref 4.22–5.81)
RDW: 15.6 % — AB (ref 11.5–15.5)
WBC: 15.3 10*3/uL — ABNORMAL HIGH (ref 4.0–10.5)

## 2015-08-19 LAB — GLUCOSE, CAPILLARY
GLUCOSE-CAPILLARY: 229 mg/dL — AB (ref 65–99)
GLUCOSE-CAPILLARY: 227 mg/dL — AB (ref 65–99)

## 2015-08-19 MED ORDER — INSULIN ASPART 100 UNIT/ML ~~LOC~~ SOLN
0.0000 [IU] | Freq: Every day | SUBCUTANEOUS | Status: DC
  Administered 2015-08-19 – 2015-08-28 (×4): 2 [IU] via SUBCUTANEOUS
  Administered 2015-08-31 – 2015-09-01 (×2): 3 [IU] via SUBCUTANEOUS
  Administered 2015-09-04: 2 [IU] via SUBCUTANEOUS

## 2015-08-19 MED ORDER — INSULIN ASPART 100 UNIT/ML ~~LOC~~ SOLN
0.0000 [IU] | Freq: Three times a day (TID) | SUBCUTANEOUS | Status: DC
  Administered 2015-08-19: 5 [IU] via SUBCUTANEOUS
  Administered 2015-08-20 (×3): 3 [IU] via SUBCUTANEOUS
  Administered 2015-08-21: 5 [IU] via SUBCUTANEOUS
  Administered 2015-08-21 – 2015-08-22 (×2): 3 [IU] via SUBCUTANEOUS
  Administered 2015-08-22: 2 [IU] via SUBCUTANEOUS
  Administered 2015-08-23 – 2015-08-24 (×4): 3 [IU] via SUBCUTANEOUS
  Administered 2015-08-24: 2 [IU] via SUBCUTANEOUS
  Administered 2015-08-24 – 2015-08-27 (×8): 3 [IU] via SUBCUTANEOUS
  Administered 2015-08-28: 2 [IU] via SUBCUTANEOUS
  Administered 2015-08-29: 3 [IU] via SUBCUTANEOUS
  Administered 2015-08-29: 2 [IU] via SUBCUTANEOUS
  Administered 2015-08-30: 8 [IU] via SUBCUTANEOUS
  Administered 2015-08-30: 3 [IU] via SUBCUTANEOUS
  Administered 2015-08-31: 8 [IU] via SUBCUTANEOUS
  Administered 2015-08-31: 3 [IU] via SUBCUTANEOUS
  Administered 2015-09-01: 2 [IU] via SUBCUTANEOUS
  Administered 2015-09-01 – 2015-09-02 (×3): 3 [IU] via SUBCUTANEOUS
  Administered 2015-09-03 – 2015-09-04 (×5): 2 [IU] via SUBCUTANEOUS
  Administered 2015-09-05 (×2): 3 [IU] via SUBCUTANEOUS
  Administered 2015-09-06 – 2015-09-07 (×3): 2 [IU] via SUBCUTANEOUS
  Administered 2015-09-08 – 2015-09-09 (×2): 3 [IU] via SUBCUTANEOUS

## 2015-08-19 MED ORDER — SORBITOL 70 % SOLN
60.0000 mL | Status: AC
  Administered 2015-08-19: 60 mL via ORAL
  Filled 2015-08-19: qty 60

## 2015-08-19 MED ORDER — FENTANYL 12 MCG/HR TD PT72
12.5000 ug | MEDICATED_PATCH | TRANSDERMAL | Status: DC
  Administered 2015-08-19 – 2015-09-06 (×7): 12.5 ug via TRANSDERMAL
  Filled 2015-08-19 (×7): qty 1

## 2015-08-19 MED ORDER — CALCITRIOL 0.5 MCG PO CAPS
ORAL_CAPSULE | ORAL | Status: AC
  Administered 2015-08-19: 19:00:00
  Filled 2015-08-19: qty 1

## 2015-08-19 NOTE — Progress Notes (Signed)
Patient refused suppository. LBM 08/11/15 Patient stated he would like physical therapy to teach him how to transfer out of bed safely to use commode. Patient wants privacy while using bathroom. Patient assisted to edge of bed to use urinal with 2+ assistance. adm

## 2015-08-19 NOTE — Progress Notes (Signed)
Physical Therapy Session Note  Patient Details  Name: Seth [name] MRN: [id number] Date of Birth: 1966/06/07  Today's Date: 08/19/2015 PT Individual Time: [id number] PT Individual Time Calculation (min): 83 min   Short Term Goals: Week 1:  PT Short Term Goal 1 (Week 1): Pt will be able to perform transfer with 1 person assist with LRAD PT Short Term Goal 2 (Week 1): Pt will be able to initiate w/c propulsion PT Short Term Goal 3 (Week 1): Pt will be able to perform sit to stand with +2 assist without use of lift equipment  Skilled Therapeutic Interventions/Progress Updates:  Session focused on functional transfers including slide board and sit <> stand, generalized strengthening, adherence to precautions, and activity tolerance. Patient transported to gym in recliner and performed slide board transfer from recliner to level mat table with assist to place slide board and supervision overall. Patient able to recall 1/3 back precautions, continued to reinforce education throughout session with minimal carryover. Patient requested to rest, reclined on wedge and pillows. In semi reclined position, performed BLE therex with 3# wts: LAQ and hip flexion x 20 each LE. RN present to administer pain patch. Patient performed sit <> stand transfers from 16" mat table using bariatric RW with mod A x 1 and able to maintain standing x 120 sec + 18 sec + 90 sec with close supervision and max multimodal cues for upright posture due to forward flexion. Patient with uncontrolled descent with RLE buckling terminating standing trials. Patient required prolonged rest break following standing trials while therapist retrieved 22 x 18 wheelchair and wheelchair cushion. Patient performed slide board transfer from mat to wheelchair and propelled wheelchair using BUE to room x 160 ft with increased time and verbal cues for technique. Patient left sitting in wheelchair with all needs within reach.   Therapy  Documentation Precautions:  Precautions Precautions: Fall, Back Precaution Comments: reviewed/ demonstrated back precautions, however, pt with 0 carry over of education and unable to recall pre-cautions after 5 minutes Restrictions Weight Bearing Restrictions: No Pain: Pain Assessment Pain Assessment: 0-10 Pain Score: 10-Worst pain ever Pain Type: Acute pain Pain Location: Back Pain Orientation: Left;Mid Pain Descriptors / Indicators: Aching Pain Onset: With Activity Pain Intervention(s): Repositioned;Rest;RN made aware   See Function Navigator for Current Functional Status.   Therapy/Group: Individual Therapy  Laretta Alstrom 08/19/2015, 12:11 PM

## 2015-08-19 NOTE — Procedures (Signed)
Patient was seen on dialysis and the procedure was supervised.  BFR 250  Via AVF BP is  138/57.   Patient appears to be tolerating treatment well  Seth [name] A 08/19/2015

## 2015-08-19 NOTE — Progress Notes (Signed)
Occupational Therapy Session Note  Patient Details  Name: Seth [name] MRN: [id number] Date of Birth: Feb 16, 1966  Today's Date: 08/19/2015 OT Individual Time: [id number] and 1300-1400 OT Individual Time Calculation (min): 83 min and 60 min   Short Term Goals: Week 1:  OT Short Term Goal 1 (Week 1): Pt will complete toileting task with max A +1 OT Short Term Goal 2 (Week 1): Pt will demonstrate carry over of education of spinal pre-cautions with min VCs OT Short Term Goal 3 (Week 1): Pt will complete sit <> stand at sink with max A +1 OT Short Term Goal 4 (Week 1): Pt will transfer to [place] with +1 assist  Skilled Therapeutic Interventions/Progress Updates:    Session One: Pt seen for OT therapy session focusing on functional transfers, sit <> stands, and ADL re-training. Pt in supine upon arrival, agreeable to tx session. He voiced desire to figure out way for toilet transfer as he wants more independence for toileting tasks as he required +3 assist yesterday for clothing management and transfer to [facility]. Initially attempted anterior/posterior transfer, however due to body habitus and decreased functional endurance, pt unable to tolerate full transfer. He completed sliding board transfer from EOB> recliner with min A and +2 available to stabilize equipment. He stood at sink with mod A +2 to stand at sink. Pt unable to come into full upright stance, as he was bent over sink, however, demonstrated functional static standing balance with min A. While standing, recliner replaced with bariatric roll in shower chair. Pt able to sit in chair mod I in attempt for BM. Pt unsuccessful in having BM, however, educated pt and nursing staff in best way to transfer when toileting needs arise.  Pt requires increased cuing throughout session for safety, transfer techniques, need for assist, and current deficits.  Pt educated throughout regarding CIR, OT goals, POC, and d/c planning.  Pt became  slightly agitated in regards to hemo schedule and therapy schedule, worried all of his responsibilities in CIR would limit his time with family. Education and reassurance provided and followed up with CSW.    Session Two: Pt seen for OT therapy session focusing on functional activity tolerance, UE strengthening, functional sitting balance, and upright posture. Pt sitting in w/c upon arrival, agreeable to tx session. He self propelled w/c using B UEs and LEs throughout unit for strengthening, requiring increased time and rest breaks throughout. In therapy gym, he transferred to therapy mat with assist to stabilize equipment. Pt sat on EOM with physioball placed behind working on chest expansion as pt with flexed sitting posture. Pt voiced increased muscle tightness in B shoulders and neck. Kinesiotape applied and light massage performed for comfort. Pt sat on edge of mat unsupported for ball toss task with emphasis on functional sitting balance and unsupported sitting tolerance in prep for unsupported seated tasks (i.e. Toileting). Pt returned to room in same manner as described above. Encouragement provided for independence and education regarding working towards OT goals. Sliding board transfer completed back to bed at end of session. Pt left in supine with all needs in reach.   Therapy Documentation Precautions:  Precautions Precautions: Fall, Back Precaution Comments: reviewed/ demonstrated back precautions, however, pt with 0 carry over of education and unable to recall pre-cautions after 5 minutes Restrictions Weight Bearing Restrictions: No Pain: Pain Assessment Pain Assessment: 0-10 Pain Score: 10-Worst pain ever Pain Type: Acute pain Pain Location: Back Pain Orientation: Left;Mid Pain Descriptors / Indicators: Aching Pain  Onset: With Activity Pain Intervention(s): Repositioned;Rest;RN made aware  See Function Navigator for Current Functional Status.   Therapy/Group: Individual  Therapy  Lewis, Adrionna Delcid C 08/19/2015, 7:06 AM

## 2015-08-19 NOTE — Plan of Care (Signed)
Problem: SCI BOWEL ELIMINATION Goal: RH STG SCI MANAGE BOWEL WITH MEDICATION WITH ASSISTANCE STG SCI Manage bowel with medication with assistance. Mod I  Outcome: Not Progressing Patient refused suppository. LBM 08/11/15. Sorbitol given during previous shift. No results

## 2015-08-19 NOTE — Progress Notes (Signed)
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 amLODipine  10 mg Oral Daily  . antiseptic oral rinse  7 mL Mouth Rinse QID  . aspirin  81 mg Oral Daily  . calcitRIOL  0.5 mcg Oral Q M,W,F  . calcium acetate  2,001 mg Oral TID WC  .  ceFAZolin (ANCEF) IV  2 g Intravenous Q M,W,F-HD  . chlorhexidine gluconate  15 mL Mouth Rinse BID  . cholecalciferol  1,000 Units Oral Daily  . cinacalcet  30 mg Oral Q breakfast  . [START ON 08/24/2015] darbepoetin (ARANESP) injection - DIALYSIS  100 mcg Intravenous Q Mon-HD  . enoxaparin (LOVENOX) injection  30 mg Subcutaneous Q24H  . hydrocerin   Topical BID  . pantoprazole  40 mg Oral QHS  . senna-docusate  2 tablet Oral QHS   Continuous Infusions:   PRN Meds:.acetaminophen, albuterol, bisacodyl, diphenhydrAMINE, guaiFENesin-dextromethorphan, oxyCODONE, promethazine **OR** promethazine **OR** promethazine, sodium chloride, sodium phosphate, sorbitol, traZODone  Current Labs: reviewed  B Cx x2 from 11/1: NGTD Surgical Cx 11/2: NGTD   Physical Exam:  Blood pressure 135/75, pulse 80, temperature 97.7 F (36.5 C), temperature source Oral, resp. rate 18, weight 135.9 kg (299 lb 9.7 oz), SpO2 93 %. GEN: NAD, obese, intubated ENT: NCAT EYES: EOMI CV: RRR, no rub PULM: CTAB ABD: s/nt/nd SKIN: No rashes/lesiosn EXT:No LEE RUE with mild edema, stable  Outpt HD Orders Unit: [facility] Days: MWF Time: 4h Dialyzer: Elisio EDW: 138kg K/Ca: 2/2.5 Access: AVF, RFA Needle Size: 14g BFR/DFR: 500/800 VDRA: Hectorol 36mcg qTx EPO: Epogen 5000 units qTx Heparin: 4500 IU at start, then infusion 1000  IU/hr.  A/P 1. ESRD:  1. MWF [place] via AVF- due later today 2. VF now seems fine 3. Holding heparin currently 2. Pathogical Fracture at T4 and T5 1. S/p laminectomy 11/2 2. Likely OM, Cx NGTD in blood and at spine 3. On cefazolin, recent Stah Lugdunesis bacteremia- abx will need to be continued at least 6 weeks 4. 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 Postoperative VDRF: now extubated and resolved 4. HTN/Vol: Aim for EDW- lower than previous, BP stable- requiring huge amounts of UF per tx 5. Anemia: Hb dropping- added aranesp - transfuse if needed 6. MBD: cont PhosLo, calcitriol and cinacaclet,  1. PTH 407 2. Cont binder- increased phoslo to 3 with meals  Seth [name] A  08/19/2015, 8:48 AM   Recent Labs Lab 08/13/15 0105 08/14/15 0424 08/17/15 0355  NA 135 134* 129*  K 4.3 4.6 4.4  CL 97* 95* 92*  CO2 27 26 24   GLUCOSE 217* 157* 161*  BUN 22* 32* 76*  CREATININE 6.79* 9.24* 12.14*  CALCIUM 7.9* 7.7* 8.4*  PHOS  --  7.0* 8.0*    Recent Labs Lab 08/14/15 0424 08/16/15 1520 08/17/15 0355  WBC 12.9* 13.8* 13.4*  NEUTROABS  --  11.6*  --   HGB 9.3* 8.0* 7.7*  HCT 28.4* 24.7* 24.1*  MCV 98.3 95.4 95.3  PLT 277 311 302

## 2015-08-19 NOTE — Progress Notes (Signed)
[place] PHYSICAL MEDICINE & REHABILITATION     PROGRESS NOTE    Subjective/Complaints: Struggled with therapies yesterday===?due to pain. Needs bm. Back most sore between shoulder blades.   ROS: Pt denies fever, rash/itching, headache, blurred or double vision, nausea, vomiting, abdominal pain, diarrhea, chest pain, shortness of breath, palpitations, dysuria, dizziness, bleeding, anxiety, or depression   Objective: Vital Signs: Blood pressure 135/75, pulse 80, temperature 97.7 F (36.5 C), temperature source Oral, resp. rate 18, weight 135.9 kg (299 lb 9.7 oz), SpO2 93 %. No results found.  Recent Labs  08/16/15 1520 08/17/15 0355  WBC 13.8* 13.4*  HGB 8.0* 7.7*  HCT 24.7* 24.1*  PLT 311 302    Recent Labs  08/17/15 0355  NA 129*  K 4.4  CL 92*  GLUCOSE 161*  BUN 76*  CREATININE 12.14*  CALCIUM 8.4*   CBG (last 3)   Recent Labs  08/17/15 1121 08/17/15 2034 08/18/15 0654  GLUCAP 154* 205* 186*    Wt Readings from Last 3 Encounters:  08/19/15 135.9 kg (299 lb 9.7 oz)  08/17/15 136.8 kg (301 lb 9.4 oz)  08/10/15 139.254 kg (307 lb)    Physical Exam:  Constitutional: He is oriented to person, place, and time. He appears well-developed and well-nourished.  Morbidly obese male lying in bed. NAD.  HENT:  Head: Normocephalic and atraumatic.  Eyes: Conjunctivae and EOM are normal. Pupils are equal, round, and reactive to light.  Neck: Normal range of motion. Neck supple.  Central line left neck remains in place Cardiovascular: Normal rate and regular rhythm.  Respiratory: Effort normal and breath sounds normal. No respiratory distress. He has no wheezes.  GI: Soft. Bowel sounds are normal. He exhibits no distension. There is no tenderness.  Reducible umbilical hernia  Musculoskeletal: He exhibits no edema or tenderness.  Strength B/l UE 4+/5 throughout LLE hip flexion 3/5, ankle dorsi/plantar flexion 4/5 RLE: hip flexion 3-/5, ankle dorsi/plantar  flexion 4-/5  Neurological: He is alert and oriented to person, place, and time.  Speech clear.  Able to follow commands without difficulty.  He is alert and oriented to person, place, and time.  He has normal reflexes.  Intentional tremors BUE. Sensation intact to light touchin all 4 limbs  Skin: Skin is warm and dry. Surgical site with honeycomb dressing, drain with minimal drainage. Multiple healing scabs BLE. Dry flaky skin bilateral feet and thighs.  Psychiatric: He has a normal mood and affect. His speech is normal. Thought content normal. Cognition and memory are normal.     Assessment/Plan: 1. Functional deficits secondary to T4 osteomyelitis with cord compression/myelopathy which require 3+ hours per day of interdisciplinary therapy in a comprehensive inpatient rehab setting. Physiatrist is providing close team supervision and 24 hour management of active medical problems listed below. Physiatrist and rehab team continue to assess barriers to discharge/monitor patient progress toward functional and medical goals.  Function:  Bathing Bathing position   Position: Bed  Bathing parts Body parts bathed by patient: Right arm, Left arm, Chest, Abdomen Body parts bathed by helper: Front perineal area, Buttocks, Right upper leg, Left upper leg, Right lower leg, Left lower leg, Back  Bathing assist        Upper Body Dressing/Undressing Upper body dressing   What is the patient wearing?: Pull over shirt/dress     Pull over shirt/dress - Perfomed by patient: Thread/unthread right sleeve, Thread/unthread left sleeve, Put head through opening Pull over shirt/dress - Perfomed by helper: Pull shirt over trunk  Upper body assist        Lower Body Dressing/Undressing Lower body dressing   What is the patient wearing?: Underwear, Pants, Non-skid slipper socks, Shoes Underwear - Performed by patient: Thread/unthread right underwear leg Underwear - Performed by helper:  Thread/unthread left underwear leg, Pull underwear up/down   Pants- Performed by helper: Thread/unthread right pants leg, Thread/unthread left pants leg, Pull pants up/down   Non-skid slipper socks- Performed by helper: Don/doff right sock, Don/doff left sock       Shoes - Performed by helper: Don/doff right shoe, Don/doff left shoe, Fasten right, Fasten left          Lower body assist Assist for lower body dressing: 2 Medical sales representative     Toileting steps completed by helper: Adjust clothing prior to toileting, Performs perineal hygiene, Adjust clothing after toileting    Toileting assist Assist level: Two helpers (3 helpers)   Transfers Chair/bed transfer   Chair/bed transfer method: Lateral scoot Chair/bed transfer assist level: 2 helpers (3 helpers) Chair/bed transfer assistive device: Air cabin crew Ambulation activity did not occur: Safety/medical concerns         Wheelchair   Type: Manual   Assist Level: Dependent (Pt equals 0%) (unable to propel w/c due to body habitus and  w/c not appropriately sized)  Cognition Comprehension Comprehension assist level: Understands complex 90% of the time/cues 10% of the time  Expression Expression assist level: Expresses complex 90% of the time/cues < 10% of the time  Social Interaction Social Interaction assist level: Interacts appropriately 75 - 89% of the time - Needs redirection for appropriate language or to initiate interaction.  Problem Solving Problem solving assist level: Solves basic 50 - 74% of the time/requires cueing 25 - 49% of the time  Memory Memory assist level: Recognizes or recalls 90% of the time/requires cueing < 10% of the time   Medical Problem List and Plan: 1. Functional deficits secondary to Cord compression secondary to Osteomyelitis/diskitis T4 and T5 with pathologic fracture 2. DVT Prophylaxis/Anticoagulation: Pharmaceutical: Lovenox to continue 3. Pain  Management: Continue oxy IR prn. Add low dose fentanyl patch---keep close eye on mentation 4. Mood: LCSW to follow for evaluation and support.  5. Neuropsych: This patient is capable of making decisions on his own behalf. 6. Skin/Wound Care: Monitor wound daily.local care  7. Fluids/Electrolytes/Nutrition: Monitor I/O. ON renal diet with 1200 cc/FR.   -labs with HD 8. ESRD: On HD MWF. Schedule sessions later in afternoons to help with therapy tolerance.  9. HTN: Monitor BID. Continue Norvasc.  10. Anemia of chronic disease: Has had drop in Hgb to 7.7. Was on aranesp weekly PTA. Renal following of input on transfusion and treatment. Monitor for symptoms with activity. 11. DM type 2:   use SSI for elevated BS.   -consider oral agent  -has had fair control at times---f  12. OSA: Continue use of CPAP.  13. Constipation: sorbitol, SSE today if no bm.  LOS (Days) 2 A FACE TO FACE EVALUATION WAS PERFORMED  SWARTZ,ZACHARY T 08/19/2015 9:07 AM

## 2015-08-20 ENCOUNTER — Inpatient Hospital Stay (HOSPITAL_COMMUNITY): Payer: Non-veteran care | Admitting: Occupational Therapy

## 2015-08-20 ENCOUNTER — Inpatient Hospital Stay (HOSPITAL_COMMUNITY): Payer: Medicare Other

## 2015-08-20 DIAGNOSIS — E1122 Type 2 diabetes mellitus with diabetic chronic kidney disease: Secondary | ICD-10-CM

## 2015-08-20 LAB — GLUCOSE, CAPILLARY
GLUCOSE-CAPILLARY: 178 mg/dL — AB (ref 65–99)
GLUCOSE-CAPILLARY: 163 mg/dL — AB (ref 65–99)
Glucose-Capillary: 178 mg/dL — ABNORMAL HIGH (ref 65–99)
Glucose-Capillary: 153 mg/dL — ABNORMAL HIGH (ref 65–99)
Glucose-Capillary: 155 mg/dL — ABNORMAL HIGH (ref 65–99)

## 2015-08-20 MED ORDER — PENTAFLUOROPROP-TETRAFLUOROETH EX AERO
INHALATION_SPRAY | CUTANEOUS | Status: AC
  Filled 2015-08-20: qty 103.5

## 2015-08-20 NOTE — Progress Notes (Signed)
Before bed, got patient up to toilet, gave suppository. Small, hard bowel. In am, gave soap suds enema and manual disimpaction. Small/medium results. More opportunity needed to evacuate bowels.

## 2015-08-20 NOTE — Progress Notes (Signed)
Physical Therapy Session Note  Patient Details  Name: Seth [name] MRN: [id number] Date of Birth: 1966-01-14  Today's Date: 08/20/2015 PT Individual Time: [id number] PT Individual Time Calculation (min): 30 min   Short Term Goals: Week 1:  PT Short Term Goal 1 (Week 1): Pt will be able to perform transfer with 1 person assist with LRAD PT Short Term Goal 2 (Week 1): Pt will be able to initiate w/c propulsion PT Short Term Goal 3 (Week 1): Pt will be able to perform sit to stand with +2 assist without use of lift equipment  Skilled Therapeutic Interventions/Progress Updates:   Session focused on functional w/c mobility with BUE and BLE for strengthening and endurance training and addressed sit <-> stands with focus on technique and eccentric control to aid with overall transfers and mobility. Pt required mod assist x 2 sit to stands and min assist for the last once with very limited standing tolerance (up to 10 seconds) before requiring seated rest break and poor eccentric control noted.   Therapy Documentation Precautions:  Precautions Precautions: Fall, Back Precaution Comments: reviewed/ demonstrated back precautions, however, pt with 0 carry over of education and unable to recall pre-cautions after 5 minutes Restrictions Weight Bearing Restrictions: No  Pain:  c/o pain in back and shoulders - premedicated.   See Function Navigator for Current Functional Status.   Therapy/Group: Individual Therapy  Canary Brim Ivory Broad, PT, DPT  08/20/2015, 3:42 PM

## 2015-08-20 NOTE — Progress Notes (Signed)
Admit: 08/17/2015 LOS: 3  69M ESRD MWF with T4-T5 pathological fracture presumed OM s/p surgical correction 08/12/15  Subjective:  S/p HD yest-no issues     11/09 0701 - 11/10 0700 In: 480 [P.O.:480] Out: 5000   Filed Weights   08/18/15 0500 08/19/15 0500 08/20/15 0512  Weight: 139 kg (306 lb 7 oz) 135.9 kg (299 lb 9.7 oz) 136.1 kg (300 lb 0.7 oz)    Scheduled Meds: . amLODipine  10 mg Oral Daily  . antiseptic oral rinse  7 mL Mouth Rinse QID  . aspirin  81 mg Oral Daily  . calcitRIOL  0.5 mcg Oral Q M,W,F  . calcium acetate  2,001 mg Oral TID WC  .  ceFAZolin (ANCEF) IV  2 g Intravenous Q M,W,F-HD  . chlorhexidine gluconate  15 mL Mouth Rinse BID  . cholecalciferol  1,000 Units Oral Daily  . cinacalcet  30 mg Oral Q breakfast  . [START ON 08/24/2015] darbepoetin (ARANESP) injection - DIALYSIS  100 mcg Intravenous Q Mon-HD  . enoxaparin (LOVENOX) injection  30 mg Subcutaneous Q24H  . fentaNYL  12.5 mcg Transdermal Q72H  . hydrocerin   Topical BID  . insulin aspart  0-15 Units Subcutaneous TID WC  . insulin aspart  0-5 Units Subcutaneous QHS  . pantoprazole  40 mg Oral QHS  . pentafluoroprop-tetrafluoroeth      . senna-docusate  2 tablet Oral QHS   Continuous Infusions:   PRN Meds:.acetaminophen, albuterol, bisacodyl, diphenhydrAMINE, guaiFENesin-dextromethorphan, oxyCODONE, promethazine **OR** promethazine **OR** promethazine, sodium chloride, sodium phosphate, sorbitol, traZODone  Current Labs: reviewed  B Cx x2 from 11/1: NGTD Surgical Cx 11/2: NGTD   Physical Exam:  Blood pressure 120/62, pulse 82, temperature 98.4 F (36.9 C), temperature source Oral, resp. rate 16, weight 136.1 kg (300 lb 0.7 oz), SpO2 94 %. GEN: NAD, obese, intubated ENT: NCAT EYES: EOMI CV: RRR, no rub PULM: CTAB ABD: s/nt/nd SKIN: No rashes/lesiosn EXT:No LEE RUE with mild edema, stable  Outpt HD Orders Unit: [facility] Days: MWF Time: 4h Dialyzer: Elisio EDW:  138kg K/Ca: 2/2.5 Access: AVF, RFA Needle Size: 14g BFR/DFR: 500/800 VDRA: Hectorol 53mcg qTx EPO: Epogen 5000 units qTx Heparin: 4500 IU at start, then infusion 1000 IU/hr.  A/P 1. ESRD:  1. MWF [place] via AVF- due later today 2. AVF now seems fine 3. Holding heparin currently 2. Pathogical Fracture at T4 and T5 1. S/p laminectomy 11/2 2. Likely OM, Cx NGTD in blood and at spine 3. On cefazolin, recent Stah Lugdunesis bacteremia- abx will need to be continued at least 6 weeks 4. 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 HTN/Vol: Aim for EDW- lower than previous, BP stable- requiring huge amounts of UF per tx- now at around 136 4. Anemia: Hb dropping- added aranesp - transfuse if needed 5. MBD: cont PhosLo, calcitriol and cinacaclet,  1. PTH 407 2. Cont binder- increased phoslo to 3 with meals- is better  Seth [name] A  08/20/2015, 8:21 AM   Recent Labs Lab 08/14/15 0424 08/17/15 0355 08/19/15 1522  NA 134* 129* 131*  K 4.6 4.4 3.9  CL 95* 92* 94*  CO2 26 24 24   GLUCOSE 157* 161* 156*  BUN 32* 76* 68*  CREATININE 9.24* 12.14* 9.75*  CALCIUM 7.7* 8.4* 9.0  PHOS 7.0* 8.0* 4.7*    Recent Labs Lab 08/16/15 1520 08/17/15 0355 08/19/15 1522  WBC 13.8* 13.4* 15.3*  NEUTROABS 11.6*  --   --  HGB 8.0* 7.7* 8.1*  HCT 24.7* 24.1* 25.3*  MCV 95.4 95.3 94.1  PLT 311 302 354

## 2015-08-20 NOTE — Progress Notes (Signed)
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 ID saw- plan Cefazolin for 6 weeks with HD. WBC 15.3 on 11/09, Afeb.  Needs TTE to be sure no notable vegetation. Echo done 11/6- poor study d/t habitus, pt moving. No vegetation noted on results  9/24: BCx: Staph lugdunensis  11/1 blood: neg 11/1: op cx: neg  Ancef 11/2 >>  (09/24/15)  Plan -Ancef 2 gms post HD on days dialysis received (currently on MWF schedule)

## 2015-08-20 NOTE — Progress Notes (Signed)
Physical Therapy Session Note  Patient Details  Name: [name] MRN: [id number] Date of Birth: 10-19-1965  Today's Date: 08/20/2015 PT Individual Time: 0810-0920 PT Individual Time Calculation (min): 70 min   Short Term Goals: Week 1:  PT Short Term Goal 1 (Week 1): Pt will be able to perform transfer with 1 person assist with LRAD PT Short Term Goal 2 (Week 1): Pt will be able to initiate w/c propulsion PT Short Term Goal 3 (Week 1): Pt will be able to perform sit to stand with +2 assist without use of lift equipment  Skilled Therapeutic Interventions/Progress Updates:    Pt making phone calls due to "emergency at home last night" (causing session to start late) and reporting he has had a rough night with trying to have a BM, back pain, and just too sore from therapies yesterday. Pt reports he "told them I just can't do much today" and declines OOB or attempting transfers at this time.  With encouragement, assisted to EOB with min assist using rails and HOB elevated to attempt to eat breakfast but pt declines stating he will just drink some coffee. Educated on importance of continued mobility despite being sore from yesterday's therapies and importance of OOB to increase endurance as well as help with moving bowels and upright tolerance while pt maintaining sitting balance with UE support EOB at supervision level with cues to adhere to precautions and maintain upright posture.   Agreeable to seated BLE therex for functional strengthening including heel raises, toe raises, LAQ with 5 second hold, and marches x 20 reps each with rest breaks as needed. Pt complaining of back and shoulder pain and requesting to lay back down; required min assist to get back into supine and educated on using BLE with knees bent in bridge position to aid with scooting up in the bed and repositioning. Supine heel slides, hip abduction, SAQ, and quad sets x 20 reps each side. Handout given and instructed to work on  during "down time" and pt in agreement. Pt requesting to be excused to make more phone calls and ended session earlier causing more missed time.   Pt missed 20 min of skilled PT time due to dealing with personal issues on the phone.  Therapy Documentation Precautions:  Precautions Precautions: Fall, Back Precaution Comments: reviewed/ demonstrated back precautions, however, pt with 0 carry over of education and unable to recall pre-cautions after 5 minutes Restrictions Weight Bearing Restrictions: No General: PT Amount of Missed Time (min): 20 Minutes PT Missed Treatment Reason: Other (Comment) (Pt dealing with personal issues/phone calls)  Pain:  Premedicated for back and shoulder pain.    See Function Navigator for Current Functional Status.   Therapy/Group: Individual Therapy  Canary Brim Ivory Broad, PT, DPT  08/20/2015, 9:24 AM

## 2015-08-20 NOTE — IPOC Note (Signed)
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[facility]) Active Problems:   Morbid obesity ([place])   Essential hypertension   End stage renal disease on dialysis ([place])   Controlled type 2 diabetes mellitus with chronic kidney disease on chronic dialysis ([place])   Discitis of thoracic region   Osteomyelitis of thoracic spine ([name])     Functional Problem List: Nursing Endurance, Motor, Pain, Safety  PT Balance, Endurance, Motor, Pain, Skin Integrity, Sensory, Safety  OT Balance, Behavior, Endurance, Motor, Pain, Safety  SLP    TR         Basic ADL's: OT Bathing, Dressing, Toileting     Advanced  ADL's: OT       Transfers: PT Bed Mobility, Bed to Chair, Car, Manufacturing systems engineer, Metallurgist: PT Ambulation, Emergency planning/management officer, Stairs     Additional Impairments: OT Fuctional Use of Upper Extremity  SLP        TR      Anticipated Outcomes Item Anticipated Outcome  Self Feeding Independent  Swallowing      Basic self-care  Min A  Toileting  Mod A   Bathroom Transfers Mod A  Bowel/Bladder  Continent to bowel and bladder with min. assisst.  Transfers  min assist w/c level; mod assist car  Locomotion  modified independent w/c mobility; mod assist gait with therapy  Communication     Cognition     Pain  Less than 3 on 1 to 10 scale.  Safety/Judgment  Free from fall during his stay in rehab.   Therapy Plan: PT Intensity: Minimum of 1-2 x/day ,45 to 90 minutes PT Frequency: 5 out of 7 days PT Duration Estimated Length of Stay: 21-25 days OT Intensity: Minimum of 1-2 x/day, 45 to 90 minutes OT Frequency: 5 out of 7 days OT Duration/Estimated Length of Stay: 21-23 days         Team Interventions: Nursing Interventions Patient/Family Education, Pain  Management, Skin Care/Wound Management, Medication Management  PT interventions Ambulation/gait training, Balance/vestibular training, Cognitive remediation/compensation, Community reintegration, Discharge planning, Disease management/prevention, DME/adaptive equipment instruction, Functional mobility training, Neuromuscular re-education, Pain management, Patient/family education, Psychosocial support, Skin care/wound management, Splinting/orthotics, Stair training, Therapeutic Activities, Therapeutic Exercise, UE/LE Strength taining/ROM, UE/LE Coordination activities, Wheelchair propulsion/positioning  OT Interventions Training and development officer, Discharge planning, DME/adaptive equipment instruction, Functional mobility training, Patient/family education, Neuromuscular re-education, Therapeutic Activities, Self Care/advanced ADL retraining, Therapeutic Exercise, UE/LE Strength taining/ROM, UE/LE Coordination activities  SLP Interventions    TR Interventions    SW/CM Interventions Discharge Planning, Psychosocial Support, Patient/Family Education    Team Discharge Planning: Destination: PT-Home ,OT- Home , SLP-  Projected Follow-up: PT-Home health PT, 24 hour supervision/assistance, OT-  Home health OT, SLP-  Projected Equipment Needs: PT-To be determined, Wheelchair (measurements), Wheelchair cushion (measurements), OT- To be determined, SLP-  Equipment Details: PT-Pt owns standard walker, OT-  Patient/family involved in discharge planning: PT- Patient,  OT-Patient, SLP-   MD ELOS: 16-19d Medical Rehab Prognosis:  Good Assessment: 49 y.o. male with history of DM type 2, ESRD, OSA, recent Staph lugdunensis treated with 2 weeks of IV antibiotics. He was admitted on 08/10/15 with three week history of upper back pain progressing to BLE weakness with tingling and difficulty walking. MRI thoracic spine done revealing discitis-osteomyelitis at T4-5 with endplate destructive changes and vertebral body  height loss and retropulsion of inferior margin of T4 and superior margin of T5 into spinal canal with moderate stenosis and cord compression. Patient was taken to OR on 11/02 for T4-T5 decompressive laminectomy with T4 and T5 transpedicular corpectomy and arthrodesis by Dr. Annette Stable. Did not tolerate extubation in PACU requiring re-intubation and placed on Cefzolin for 6 weeks IV therapy per ID input.   Now requiring 24/7 Rehab RN,MD, as well as CIR level PT, OT   Treatment team will focus on ADLs and mobility with goals set at [facility] /Mod A   See Team Conference Notes for weekly updates to the plan of care

## 2015-08-20 NOTE — Progress Notes (Signed)
   08/20/15 2354  BiPAP/CPAP/SIPAP  BiPAP/CPAP/SIPAP Pt Type Adult  Mask Type Nasal mask  Mask Size Medium  BiPAP/CPAP/SIPAP CPAP  Patient Home Equipment No  Auto Titrate No  Patient placed CPAP on by himself.

## 2015-08-20 NOTE — Progress Notes (Signed)
Occupational Therapy Session Note  Patient Details  Name: Seth [name] MRN: [id number] Date of Birth: 1966-07-09  Today's Date: 08/20/2015 OT Individual Time: 1040-1200 and 1300-1326 OT Individual Time Calculation (min): 80 min  And 26 min  and Today's Date: 08/20/2015 OT Missed Time: 10 Minutes Missed Time Reason: Other (comment) (Pt on phone refusing therapy at that time)   Short Term Goals: Week 1:  OT Short Term Goal 1 (Week 1): Pt will complete toileting task with max A +1 OT Short Term Goal 2 (Week 1): Pt will demonstrate carry over of education of spinal pre-cautions with min VCs OT Short Term Goal 3 (Week 1): Pt will complete sit <> stand at sink with max A +1 OT Short Term Goal 4 (Week 1): Pt will transfer to [place] with +1 assist  Skilled Therapeutic Interventions/Progress Updates:    Session One: Pt seen for OT therapy session focusing on functional transfers and ADL re-training. Pt on phone upon arrival, requesting therapist to return at later time, pt missing 10 minutes of skilled tx. Therapist returned 10 minutes later and pt sitting EOB upon arrival. Pt completed sliding board transfer to w/[name] with assist to stabilize equipment. Pt requires max VCs throughout session for redirection to task and how to safely complete tasks. Pt very insistent on doing tasks his way despite therapist educating regarding best/ safest way to complete tasks.  Pt completed stand pivot transfer to toilet with + 2 assist. Educated regarding use of 3-1 BSC over toilet for elevated surface and handrails, however, pt adamentally, refused BSC and thus completed task on standard toilet using B rails. Total A +2 required for toileting. He stood at sink with max A to finish clothing management. Educated regarding importance of independence with tasks and practicing new ways of completing tasks. He completed x3 sit <> stand transfers with max A to stand and mod A for static standing balance and  blocking of R knee. 3 trials completed for 2 min, 2 min and 30 seconds, and ~5 seconds. On last trial, pt noted to be leaning against back of w/[name]. W/[name] removed and pt's R knee buckled requiring increased assist for controlled sit to w/[name]. Pt declined leaning on w/[name] on previous trails and attributed R knee buckling to fatigue. Pt left sitting in w/[name] at end of session, all needs in reach. During session, pt described how nursing staff assisted pt to toilet for toileting task yesterday. This was not what was written on pt's safety plan. Educated pt on reason for recommendation, and RN made aware.    Session Two: Pt seen for OT therapy session focusing on LE strengthening. Pt in w/[name] upon arriva, agreeable to tx session, however, voicing he only wanted to work on LE strengthening exercises. Encouraged pt to use B LEs to therapy gym for strengthening, however, pt declined stating it would take too long. Educated regarding using task as form of LE strengthening, however, pt cont to refuse. Pt taken to therapy gym total A. In gym, pt completed LE strengthening exercises consisting of knee extension, hip flexion, and ball squeeze btwn legs. All completed x3 sets of 10 reps with rest breaks provided throughout and VCs for proper form and technique. Education provided regarding functional implications of exercises. Pt returned to room at end of session, left sitting in w/[name] with all needs in reach. Pt educated regarding role of OT, OT goals, POC, energy conservation, and d/[name] planning.   Therapy Documentation Precautions:  Precautions  Precautions: Fall, Back Precaution Comments: reviewed/ demonstrated back precautions, however, pt with 0 carry over of education and unable to recall pre-cautions after 5 minutes Restrictions Weight Bearing Restrictions: No Pain:   Voiced discomfort in back, however, no formal number given. Pt repositioned.   See Function Navigator for Current Functional Status.   Therapy/Group:  Individual Therapy  Seth [name] 08/20/2015, 7:09 AM

## 2015-08-20 NOTE — Progress Notes (Signed)
Social Work [name], male   DOB: 1965-11-13, 49 y.o.   MRN: [id number]   Attempting to review team conference information with pt this morning, however, he was highly distracted with trying to "take care of my bills".  He is aware that d/c date targeted for 11/30 but was not able to get him to focus enough to review goals or to discuss d/c assist plans.  At this point, pt has not been agreeable to my contact with any other people who might be part of his care team.  Will speak with him again tomorrow and hope he is less distracted - need to confirm 24/7 as well as home accessibility to w/c.    Arbutus Nelligan, LCSW

## 2015-08-20 NOTE — Progress Notes (Signed)
[place] PHYSICAL MEDICINE & REHABILITATION     PROGRESS NOTE    Subjective/Complaints: Had a better day. Moved bowels. Feels that pain is better controlled. Slept well last night.   ROS: Pt denies fever, rash/itching, headache, blurred or double vision, nausea, vomiting, abdominal pain, diarrhea, chest pain, shortness of breath, palpitations, dysuria, dizziness, bleeding, anxiety, or depression   Objective: Vital Signs: Blood pressure 120/62, pulse 82, temperature 98.4 F (36.9 C), temperature source Oral, resp. rate 16, weight 136.1 kg (300 lb 0.7 oz), SpO2 94 %. No results found.  Recent Labs  08/19/15 1522  WBC 15.3*  HGB 8.1*  HCT 25.3*  PLT 354    Recent Labs  08/19/15 1522  NA 131*  K 3.9  CL 94*  GLUCOSE 156*  BUN 68*  CREATININE 9.75*  CALCIUM 9.0   CBG (last 3)   Recent Labs  08/19/15 2121 08/20/15 0339 08/20/15 0649  GLUCAP 227* 178* 178*    Wt Readings from Last 3 Encounters:  08/20/15 136.1 kg (300 lb 0.7 oz)  08/17/15 136.8 kg (301 lb 9.4 oz)  08/10/15 139.254 kg (307 lb)    Physical Exam:  Constitutional: He is oriented to person, place, and time. He appears well-developed and well-nourished.  Morbidly obese male lying in bed. NAD.  HENT:  Head: Normocephalic and atraumatic.  Eyes: Conjunctivae and EOM are normal. Pupils are equal, round, and reactive to light.  Neck: Normal range of motion. Neck supple.  Central line left neck remains in place Cardiovascular: Normal rate and regular rhythm.  Respiratory: Effort normal and breath sounds normal. No respiratory distress. He has no wheezes.  GI: Soft. Bowel sounds are normal. He exhibits no distension. There is no tenderness.  Reducible umbilical hernia  Musculoskeletal: He exhibits no edema or tenderness.  Strength B/l UE 4+/5 throughout LLE hip flexion 3/5, ankle dorsi/plantar flexion 4/5 RLE: hip flexion 3-/5, ankle dorsi/plantar flexion 4-/5  Neurological: He is alert and  oriented to person, place, and time.  Speech clear.  Able to follow commands without difficulty.  He is alert and oriented to person, place, and time.  He has normal reflexes.  Intentional tremors BUE. Sensation intact to light touchin all 4 limbs  Skin: Skin is warm and dry. Surgical site with honeycomb dressing, drain with minimal drainage. Multiple healing scabs BLE. Dry flaky skin bilateral feet and thighs.  Psychiatric: He has a normal mood and affect. His speech is normal. Thought content normal. Cognition and memory are normal.     Assessment/Plan: 1. Functional deficits secondary to T4 osteomyelitis with cord compression/myelopathy which require 3+ hours per day of interdisciplinary therapy in a comprehensive inpatient rehab setting. Physiatrist is providing close team supervision and 24 hour management of active medical problems listed below. Physiatrist and rehab team continue to assess barriers to discharge/monitor patient progress toward functional and medical goals.  Function:  Bathing Bathing position   Position: Bed  Bathing parts Body parts bathed by patient: Right arm, Left arm, Chest, Abdomen Body parts bathed by helper: Front perineal area, Buttocks, Right upper leg, Left upper leg, Right lower leg, Left lower leg, Back  Bathing assist        Upper Body Dressing/Undressing Upper body dressing   What is the patient wearing?: Pull over shirt/dress     Pull over shirt/dress - Perfomed by patient: Thread/unthread right sleeve, Thread/unthread left sleeve, Put head through opening Pull over shirt/dress - Perfomed by helper: Pull shirt over trunk  Upper body assist        Lower Body Dressing/Undressing Lower body dressing   What is the patient wearing?: Underwear, Pants, Non-skid slipper socks, Shoes Underwear - Performed by patient: Thread/unthread right underwear leg Underwear - Performed by helper: Thread/unthread left underwear leg, Pull  underwear up/down   Pants- Performed by helper: Thread/unthread right pants leg, Thread/unthread left pants leg, Pull pants up/down   Non-skid slipper socks- Performed by helper: Don/doff right sock, Don/doff left sock       Shoes - Performed by helper: Don/doff right shoe, Don/doff left shoe, Fasten right, Fasten left          Lower body assist Assist for lower body dressing: 2 Medical sales representative   Toileting steps completed by patient: Performs perineal hygiene Toileting steps completed by helper: Adjust clothing prior to toileting, Adjust clothing after toileting Toileting Assistive Devices: Grab bar or rail  Toileting assist Assist level: Two helpers (Standing at sink)   Transfers Chair/bed transfer   Chair/bed transfer method: Lateral scoot Chair/bed transfer assist level: Supervision or verbal cues Chair/bed transfer assistive device: Sliding board, Armrests     Locomotion Ambulation Ambulation activity did not occur: Safety/medical concerns         Wheelchair   Type: Manual Max wheelchair distance: 160 ft Assist Level: Supervision or verbal cues  Cognition Comprehension Comprehension assist level: Understands complex 90% of the time/cues 10% of the time  Expression Expression assist level: Expresses complex 90% of the time/cues < 10% of the time  Social Interaction Social Interaction assist level: Interacts appropriately 75 - 89% of the time - Needs redirection for appropriate language or to initiate interaction.  Problem Solving Problem solving assist level: Solves basic 50 - 74% of the time/requires cueing 25 - 49% of the time  Memory Memory assist level: Recognizes or recalls 90% of the time/requires cueing < 10% of the time   Medical Problem List and Plan: 1. Functional deficits secondary to Cord compression secondary to Osteomyelitis/diskitis T4 and T5 with pathologic fracture 2. DVT Prophylaxis/Anticoagulation: Pharmaceutical: Lovenox to  continue 3. Pain Management: Continue oxy IR prn. Added low dose fentanyl patch  4. Mood: LCSW to follow for evaluation and support.  5. Neuropsych: This patient is capable of making decisions on his own behalf. 6. Skin/Wound Care: continuelocal care  7. Fluids/Electrolytes/Nutrition: Monitor I/O. ON renal diet with 1200 cc/FR.   -labs with HD 8. ESRD: On HD MWF. Schedule sessions later in afternoons to help with therapy tolerance.  9. HTN: Monitor BID. Continue Norvasc.  10. Anemia of chronic disease:following up with HD (8.1) 11. DM type 2:   use SSI for elevated BS.   -consider oral agent  -has had fair control at times--- 12. OSA: Continue use of CPAP.  13. Constipation: continue to be aggressive with regimen as needed. LOS (Days) 3 A FACE TO FACE EVALUATION WAS PERFORMED  Seth [name] T 08/20/2015 9:13 AM

## 2015-08-21 ENCOUNTER — Inpatient Hospital Stay (HOSPITAL_COMMUNITY): Payer: Non-veteran care | Admitting: Occupational Therapy

## 2015-08-21 ENCOUNTER — Inpatient Hospital Stay (HOSPITAL_COMMUNITY): Payer: Medicare Other

## 2015-08-21 ENCOUNTER — Inpatient Hospital Stay (HOSPITAL_COMMUNITY): Payer: Non-veteran care | Admitting: Physical Therapy

## 2015-08-21 ENCOUNTER — Inpatient Hospital Stay (HOSPITAL_COMMUNITY): Payer: Non-veteran care

## 2015-08-21 DIAGNOSIS — M4644 Discitis, unspecified, thoracic region: Secondary | ICD-10-CM

## 2015-08-21 LAB — GLUCOSE, CAPILLARY
GLUCOSE-CAPILLARY: 170 mg/dL — AB (ref 65–99)
Glucose-Capillary: 205 mg/dL — ABNORMAL HIGH (ref 65–99)
Glucose-Capillary: 163 mg/dL — ABNORMAL HIGH (ref 65–99)

## 2015-08-21 LAB — CBC
HEMATOCRIT: 26.3 % — AB (ref 39.0–52.0)
Hemoglobin: 8.5 g/dL — ABNORMAL LOW (ref 13.0–17.0)
MCH: 30.1 pg (ref 26.0–34.0)
MCHC: 32.3 g/dL (ref 30.0–36.0)
MCV: 93.3 fL (ref 78.0–100.0)
PLATELETS: 439 10*3/uL — AB (ref 150–400)
RBC: 2.82 MIL/uL — ABNORMAL LOW (ref 4.22–5.81)
RDW: 15.4 % (ref 11.5–15.5)
WBC: 16.2 10*3/uL — ABNORMAL HIGH (ref 4.0–10.5)

## 2015-08-21 LAB — RENAL FUNCTION PANEL
Albumin: 2.8 g/dL — ABNORMAL LOW (ref 3.5–5.0)
Anion gap: 14 (ref 5–15)
BUN: 72 mg/dL — AB (ref 6–20)
CHLORIDE: 93 mmol/L — AB (ref 101–111)
CO2: 23 mmol/L (ref 22–32)
CREATININE: 9.82 mg/dL — AB (ref 0.61–1.24)
Calcium: 9.2 mg/dL (ref 8.9–10.3)
GFR calc Af Amer: 6 mL/min — ABNORMAL LOW (ref >60)
GFR, EST NON AFRICAN AMERICAN: 5 mL/min — AB (ref >60)
GLUCOSE: 141 mg/dL — AB (ref 65–99)
POTASSIUM: 4.2 mmol/L (ref 3.5–5.1)
Phosphorus: 3.9 mg/dL (ref 2.5–4.6)
Sodium: 130 mmol/L — ABNORMAL LOW (ref 135–145)

## 2015-08-21 MED ORDER — CALCITRIOL 0.5 MCG PO CAPS
0.5000 ug | ORAL_CAPSULE | ORAL | Status: DC
  Administered 2015-08-24 – 2015-09-07 (×7): 0.5 ug via ORAL
  Filled 2015-08-21 (×9): qty 1

## 2015-08-21 NOTE — Progress Notes (Signed)
Occupational Therapy Session Note  Patient Details  Name: Seth [name] MRN: [id number] Date of Birth: 07-16-1966  Today's Date: 08/21/2015 OT Individual Time: 1100-1200 and 1400-1500 OT Individual Time Calculation (min): 60 min and 60 min   Short Term Goals: Week 1:  OT Short Term Goal 1 (Week 1): Pt will complete toileting task with max A +1 OT Short Term Goal 2 (Week 1): Pt will demonstrate carry over of education of spinal pre-cautions with min VCs OT Short Term Goal 3 (Week 1): Pt will complete sit <> stand at sink with max A +1 OT Short Term Goal 4 (Week 1): Pt will transfer to [place] with +1 assist  Skilled Therapeutic Interventions/Progress Updates:    Session One: Pt seen for OT ADL bathing and dressing session. Pt in supine upon arrival, agreeable to tx session. With bed elevated, pt completed stand pivot transfer at [facility] with +2 assist available and blocking of R knee to w/c. He completed bathing/dressing task seated in w/c at the sink. He completed sit <> stands at RW to pull pants up with assist to block R knee and stabilize equipment. Assist required for controlled descent due to decreased decentric control. He demonstrated good functional standing balance, able to pull up pants while steadying assist and blocking of R knee provided. Pt educated regarding AE for LB dressing, able to successfully use sock aid following demonstration. Pt required increased rest breaks throughout session following standing trials and VCs for re-direction to task as he is very distractable.   Pt left sitting in w/c at end of session, set-up with lunch tray and all needs in reach.   Session Two: Pt seen for OT therapy session focusing on functional mobility and UE strengthening/ activity tolerance. Pt sitting up in w/c upon arrival, voicing fatigue from PT session, however, agreeable to tx session. In ADL apartment, attempted to ambulate into bathroom to practice toilet transfers. He  required max A to stand from w/c at [facility]. Upon standing, pt voiced considerable weakness in R LE and requested to sit. Attempted 2 more standing trial tasks, however, pt unable to tolerate standing >30 seconds. He requested to stop standing/ ambulation trials due to fatigue. In therapy gym, pt completed UE strengthening exercises using level I theraband and #2 dowel. Exercises completed to all major muscle groups with verbal and demonstrational cues provided for proper form and technique.  Pt returned to room at end of session, left sitting in recliner at end of session, all needs in reach.   Therapy Documentation Precautions:  Precautions Precautions: Fall, Back Precaution Comments: reviewed/ demonstrated back precautions, however, pt with 0 carry over of education and unable to recall pre-cautions after 5 minutes Restrictions Weight Bearing Restrictions: No Pain:   No/ denies pain  See Function Navigator for Current Functional Status.   Therapy/Group: Individual Therapy  Lewis, Alicja Everitt C 08/21/2015, 7:09 AM

## 2015-08-21 NOTE — Progress Notes (Signed)
[facility] PHYSICAL MEDICINE & REHABILITATION     PROGRESS NOTE    Subjective/Complaints: Activity tolerance improving. Slept well. Pain improved.   ROS: Pt denies fever, rash/itching, headache, blurred or double vision, nausea, vomiting, abdominal pain, diarrhea, chest pain, shortness of breath, palpitations, dysuria, dizziness, bleeding, anxiety, or depression   Objective: Vital Signs: Blood pressure 134/72, pulse 79, temperature 97.8 F (36.6 C), temperature source Oral, resp. rate 18, weight 134.1 kg (295 lb 10.2 oz), SpO2 95 %. No results found.  Recent Labs  08/19/15 1522  WBC 15.3*  HGB 8.1*  HCT 25.3*  PLT 354    Recent Labs  08/19/15 1522  NA 131*  K 3.9  CL 94*  GLUCOSE 156*  BUN 68*  CREATININE 9.75*  CALCIUM 9.0   CBG (last 3)   Recent Labs  08/20/15 1706 08/20/15 2153 08/21/15 0644  GLUCAP 153* 155* 205*    Wt Readings from Last 3 Encounters:  08/21/15 134.1 kg (295 lb 10.2 oz)  08/17/15 136.8 kg (301 lb 9.4 oz)  08/10/15 139.254 kg (307 lb)    Physical Exam:  Constitutional: He is oriented to person, place, and time. He appears well-developed and well-nourished.  Morbidly obese male lying in bed. NAD.  HENT:  Head: Normocephalic and atraumatic.  Eyes: Conjunctivae and EOM are normal. Pupils are equal, round, and reactive to light.  Neck: Normal range of motion. Neck supple.  Central line left neck remains in place Cardiovascular: Normal rate and regular rhythm.  Respiratory: Effort normal and breath sounds normal. No respiratory distress. He has no wheezes.  GI: Soft. Bowel sounds are normal. He exhibits no distension. There is no tenderness.  Reducible umbilical hernia unchanged Musculoskeletal: He exhibits no edema or tenderness.  Neuro: Strength B/l UE 4+/5 throughout LLE hip flexion 3/5, ankle dorsi/plantar flexion 4/5--unchanged RLE: hip flexion 3-/5, ankle dorsi/plantar flexion 4-/5-unchanged  : He is alert and oriented to  person, place, and time.  Speech clear.  Able to follow commands without difficulty.  He is alert and oriented to person, place, and time.  He has normal reflexes.  Intentional tremors BUE. Sensation intact to light touchin all 4 limbs  Skin: Skin is warm and dry. Surgical site with honeycomb dressing, drain with minimal drainage. Multiple healing scabs BLE. Dry flaky skin bilateral feet and thighs.  Psychiatric: He has a normal mood and affect. His speech is normal. Thought content normal. Cognition and memory are normal.     Assessment/Plan: 1. Functional deficits secondary to T4 osteomyelitis with cord compression/myelopathy which require 3+ hours per day of interdisciplinary therapy in a comprehensive inpatient rehab setting. Physiatrist is providing close team supervision and 24 hour management of active medical problems listed below. Physiatrist and rehab team continue to assess barriers to discharge/monitor patient progress toward functional and medical goals.  Function:  Bathing Bathing position   Position: Bed  Bathing parts Body parts bathed by patient: Right arm, Left arm, Chest, Abdomen Body parts bathed by helper: Front perineal area, Buttocks, Right upper leg, Left upper leg, Right lower leg, Left lower leg, Back  Bathing assist        Upper Body Dressing/Undressing Upper body dressing   What is the patient wearing?: Pull over shirt/dress     Pull over shirt/dress - Perfomed by patient: Thread/unthread right sleeve, Thread/unthread left sleeve, Put head through opening Pull over shirt/dress - Perfomed by helper: Pull shirt over trunk        Upper body assist  Lower Body Dressing/Undressing Lower body dressing   What is the patient wearing?: Underwear, Pants, Non-skid slipper socks, Shoes Underwear - Performed by patient: Thread/unthread right underwear leg Underwear - Performed by helper: Thread/unthread left underwear leg, Pull underwear  up/down   Pants- Performed by helper: Thread/unthread right pants leg, Thread/unthread left pants leg, Pull pants up/down   Non-skid slipper socks- Performed by helper: Don/doff right sock, Don/doff left sock       Shoes - Performed by helper: Don/doff right shoe, Don/doff left shoe, Fasten right, Fasten left          Lower body assist Assist for lower body dressing: 2 Medical sales representative   Toileting steps completed by patient: Performs perineal hygiene Toileting steps completed by helper: Adjust clothing prior to toileting, Performs perineal hygiene, Adjust clothing after toileting Toileting Assistive Devices: Grab bar or rail  Toileting assist Assist level: Two helpers   Transfers Chair/bed transfer   Chair/bed transfer method: Lateral scoot Chair/bed transfer assist level: Touching or steadying assistance (Pt > 75%) Chair/bed transfer assistive device: Sliding board, Armrests     Locomotion Ambulation Ambulation activity did not occur: Safety/medical concerns         Wheelchair   Type: Manual Max wheelchair distance: 150 Assist Level: Supervision or verbal cues  Cognition Comprehension Comprehension assist level: Understands complex 90% of the time/cues 10% of the time  Expression Expression assist level: Expresses complex 90% of the time/cues < 10% of the time  Social Interaction Social Interaction assist level: Interacts appropriately 75 - 89% of the time - Needs redirection for appropriate language or to initiate interaction.  Problem Solving Problem solving assist level: Solves basic 50 - 74% of the time/requires cueing 25 - 49% of the time  Memory Memory assist level: Recognizes or recalls 90% of the time/requires cueing < 10% of the time   Medical Problem List and Plan: 1. Functional deficits secondary to Cord compression secondary to Osteomyelitis/diskitis T4 and T5 with pathologic fracture 2. DVT Prophylaxis/Anticoagulation: Pharmaceutical:  Lovenox to continue 3. Pain Management: Continue oxy IR prn. Continue 12.mcg fentanyl patch  -activity tolerance improving 4. Mood: LCSW to follow for evaluation and support.  5. Neuropsych: This patient is capable of making decisions on his own behalf. 6. Skin/Wound Care: continuelocal care  7. Fluids/Electrolytes/Nutrition: Monitor I/O. ON renal diet with 1200 cc/FR.   -labs with HD 8. ESRD: On HD MWF. Schedule sessions later in afternoons to help with therapy tolerance.  9. HTN: Monitor BID. Continue Norvasc.  10. Anemia of chronic disease:following up with HD (8.1) 11. DM type 2:   use SSI for elevated BS.   -will discuss oral agent given inadequate control with diet 12. OSA: Continue use of CPAP.  13. Constipation: moved bowels yesterday  -continue lax/soft  -prn sorbitol   LOS (Days) 4 A FACE TO FACE EVALUATION WAS PERFORMED  Talene Glastetter T 08/21/2015 9:14 AM

## 2015-08-21 NOTE — Progress Notes (Signed)
Physical Therapy Session Note  Patient Details  Name: Seth [name] MRN: [id number] Date of Birth: March 15, 1966  Today's Date: 08/21/2015 PT Individual Time: 1300-1400 PT Individual Time Calculation (min): 60 min   Short Term Goals: Week 1:  PT Short Term Goal 1 (Week 1): Pt will be able to perform transfer with 1 person assist with LRAD PT Short Term Goal 2 (Week 1): Pt will be able to initiate w/c propulsion PT Short Term Goal 3 (Week 1): Pt will be able to perform sit to stand with +2 assist without use of lift equipment  Skilled Therapeutic Interventions/Progress Updates:   Session focused on w/c mobility training for strengthening and endurance at supervision level, stand pivot transfer training with RW with mod assist and cues for hand placement and technique (pt wanting to pull up on RW despite multiple cues), and gait training with bariatric RW with mod assist with +2 for close w/c follow for safety due to R knee buckling (though with ACE wrap only mild buckle and able to correct with UE support) x 10' and then x 32'. Pt became tearful and emotional due to progress with gait. Encouragement provided   Pt making great progress today and LTG's upgraded to reflect more ambulatory goals and supervision level transfers.   Therapy Documentation Precautions:  Precautions Precautions: Fall, Back Precaution Comments: reviewed/ demonstrated back precautions, however, pt with 0 carry over of education and unable to recall pre-cautions after 5 minutes Restrictions Weight Bearing Restrictions: No  Pain:  c/o back pain but able to work through it.   See Function Navigator for Current Functional Status.   Therapy/Group: Individual Therapy  Canary Brim Ivory Broad, PT, DPT  08/21/2015, 2:51 PM

## 2015-08-21 NOTE — Plan of Care (Signed)
Problem: SCI BOWEL ELIMINATION Goal: RH STG MANAGE BOWEL WITH ASSISTANCE STG Manage Bowel with Assistance. Mod I  Outcome: Not Progressing Required disimpaction

## 2015-08-21 NOTE — Procedures (Signed)
Patient seen on Hemodialysis. QB 350, UF goal 3.5L Treatment adjusted as needed.  Elmarie Shiley MD [name]. Office # [phone] Pager # [phone] 4:23 PM

## 2015-08-21 NOTE — Progress Notes (Signed)
Admit: 08/17/2015 LOS: 4  48M ESRD MWF with T4-T5 pathological fracture presumed OM s/p surgical correction 08/12/15  Subjective:  Seen in rehab- doing well- dur for HD later today      11/10 0701 - 11/11 0700 In: 500 [P.O.:480; I.V.:20] Out: 55 [Drains:55]  Filed Weights   08/19/15 0500 08/20/15 0512 08/21/15 0523  Weight: 135.9 kg (299 lb 9.7 oz) 136.1 kg (300 lb 0.7 oz) 134.1 kg (295 lb 10.2 oz)    Scheduled Meds: . amLODipine  10 mg Oral Daily  . antiseptic oral rinse  7 mL Mouth Rinse QID  . aspirin  81 mg Oral Daily  . calcitRIOL  0.5 mcg Oral Q M,W,F  . calcium acetate  2,001 mg Oral TID WC  .  ceFAZolin (ANCEF) IV  2 g Intravenous Q M,W,F-HD  . chlorhexidine gluconate  15 mL Mouth Rinse BID  . cholecalciferol  1,000 Units Oral Daily  . cinacalcet  30 mg Oral Q breakfast  . [START ON 08/24/2015] darbepoetin (ARANESP) injection - DIALYSIS  100 mcg Intravenous Q Mon-HD  . enoxaparin (LOVENOX) injection  30 mg Subcutaneous Q24H  . fentaNYL  12.5 mcg Transdermal Q72H  . hydrocerin   Topical BID  . insulin aspart  0-15 Units Subcutaneous TID WC  . insulin aspart  0-5 Units Subcutaneous QHS  . pantoprazole  40 mg Oral QHS  . senna-docusate  2 tablet Oral QHS   Continuous Infusions:   PRN Meds:.acetaminophen, albuterol, bisacodyl, diphenhydrAMINE, guaiFENesin-dextromethorphan, oxyCODONE, promethazine **OR** promethazine **OR** promethazine, sodium chloride, sodium phosphate, sorbitol, traZODone  Current Labs: reviewed  B Cx x2 from 11/1: NGTD Surgical Cx 11/2: NGTD   Physical Exam:  Blood pressure 134/72, pulse 79, temperature 97.8 F (36.6 C), temperature source Oral, resp. rate 18, weight 134.1 kg (295 lb 10.2 oz), SpO2 95 %. GEN: NAD, obese, intubated ENT: NCAT EYES: EOMI CV: RRR, no rub PULM: CTAB ABD: s/nt/nd SKIN: No rashes/lesiosn EXT:No LEE RUE with mild edema, stable  Outpt HD Orders Unit: [facility] Days: MWF Time:  4h Dialyzer: Elisio EDW: 138kg K/Ca: 2/2.5 Access: AVF, RFA Needle Size: 14g BFR/DFR: 500/800 VDRA: Hectorol 55mcg qTx EPO: Epogen 5000 units qTx Heparin: 4500 IU at start, then infusion 1000 IU/hr.  A/P 1. ESRD:  1. MWF [place] via AVF- due later today 2. AVF now seems fine 3. Holding heparin currently 2. Pathogical Fracture at T4 and T5 1. S/p laminectomy 11/2 2. Likely OM, Cx NGTD in blood and at spine 3. On cefazolin, recent Stah Lugdunesis bacteremia- abx will need to be continued at least 6 weeks- anticipated end date around 12/15 4. 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 HTN/Vol: Aim for EDW- lower than previous, BP stable- requiring huge amounts of UF per tx- now at around 136 4. Anemia: Hb dropping- added aranesp - transfuse if needed 5. MBD: cont PhosLo, calcitriol and cinacaclet,  1. PTH 407 2. Cont binder- increased phoslo to 3 with meals- is better  Seth [name] A  08/21/2015, 1:28 PM   Recent Labs Lab 08/17/15 0355 08/19/15 1522  NA 129* 131*  K 4.4 3.9  CL 92* 94*  CO2 24 24  GLUCOSE 161* 156*  BUN 76* 68*  CREATININE 12.14* 9.75*  CALCIUM 8.4* 9.0  PHOS 8.0* 4.7*    Recent Labs Lab 08/16/15 1520 08/17/15 0355 08/19/15 1522  WBC 13.8* 13.4* 15.3*  NEUTROABS 11.6*  --   --   HGB 8.0* 7.7*  8.1*  HCT 24.7* 24.1* 25.3*  MCV 95.4 95.3 94.1  PLT [phone]

## 2015-08-21 NOTE — Plan of Care (Signed)
Problem: RH PAIN MANAGEMENT Goal: RH STG PAIN MANAGED AT OR BELOW PT'S PAIN GOAL Less than 3  Outcome: Not Progressing Reports pain as 6

## 2015-08-21 NOTE — Progress Notes (Signed)
CPAP is bedside and ready for use.  Patient said he would place on himself when ready for bed. Patient aware to ask RN to call Respiratory if he needs further assistance.

## 2015-08-21 NOTE — Progress Notes (Signed)
Physical Therapy Session Note  Patient Details  Name: Seth [name] MRN: [id number] Date of Birth: 09-03-1966  Today's Date: 08/21/2015 PT Individual Time: 0800-0900 PT Individual Time Calculation (min): 60 min   Short Term Goals: Week 1:  PT Short Term Goal 1 (Week 1): Pt will be able to perform transfer with 1 person assist with LRAD PT Short Term Goal 2 (Week 1): Pt will be able to initiate w/c propulsion PT Short Term Goal 3 (Week 1): Pt will be able to perform sit to stand with +2 assist without use of lift equipment  Skilled Therapeutic Interventions/Progress Updates:    Session focused on functional transfer training (slideboard initially and progressing to squat pivot technique on level surface with min assist and +2 to stabilize w/c and then stand pivot with mod assist +2 and uncontrolled descent into w/c due to R knee buckling), w/c mobility for strengthening and endurance, sit <> stands with bariatric RW with focus on hand placement and technique with min assist, and standing balance/tolerance to be able to progress to standing transfers including marching in place and attempted side stepping. Pt prefers to direct therapist rather than take feedback from therapist for more efficient and safer techniques.  W/c gloves issued to aid with w/c mobility and pt reports they are helpful.   Therapy Documentation Precautions:  Precautions Precautions: Fall, Back Precaution Comments: reviewed/ demonstrated back precautions, however, pt with 0 carry over of education and unable to recall pre-cautions after 5 minutes Restrictions Weight Bearing Restrictions: No  Pain:  Premedicated for back pain.    See Function Navigator for Current Functional Status.   Therapy/Group: Individual Therapy  Canary Brim Ivory Broad, PT, DPT  08/21/2015, 9:00 AM

## 2015-08-22 ENCOUNTER — Inpatient Hospital Stay (HOSPITAL_COMMUNITY): Payer: Medicare Other | Admitting: Physical Therapy

## 2015-08-22 ENCOUNTER — Inpatient Hospital Stay (HOSPITAL_COMMUNITY): Payer: Medicare Other

## 2015-08-22 DIAGNOSIS — I10 Essential (primary) hypertension: Secondary | ICD-10-CM

## 2015-08-22 LAB — GLUCOSE, CAPILLARY
GLUCOSE-CAPILLARY: 115 mg/dL — AB (ref 65–99)
GLUCOSE-CAPILLARY: 130 mg/dL — AB (ref 65–99)
Glucose-Capillary: 168 mg/dL — ABNORMAL HIGH (ref 65–99)
Glucose-Capillary: 143 mg/dL — ABNORMAL HIGH (ref 65–99)

## 2015-08-22 NOTE — Progress Notes (Signed)
Patient can place self on and off CPAP. Aware to call if needing assistance.

## 2015-08-22 NOTE — Progress Notes (Signed)
Admit: 08/17/2015 LOS: 5  71M ESRD MWF with T4-T5 pathological fracture presumed OM s/p surgical correction 08/12/15  Subjective:  Seen in rehab- doing well- Hd yesterday      11/11 0701 - 11/12 0700 In: 720 [P.O.:720] Out: 2683   [facility] Weights   08/21/15 2006 08/21/15 2104 08/22/15 0544  Weight: 131.9 kg (290 lb 12.6 oz) 131.2 kg (289 lb 3.9 oz) 130.4 kg (287 lb 7.7 oz)    Scheduled Meds: . amLODipine  10 mg Oral Daily  . antiseptic oral rinse  7 mL Mouth Rinse QID  . aspirin  81 mg Oral Daily  . calcitRIOL  0.5 mcg Oral Q M,W,F  . calcium acetate  2,001 mg Oral TID WC  .  ceFAZolin (ANCEF) IV  2 g Intravenous Q M,W,F-HD  . chlorhexidine gluconate  15 mL Mouth Rinse BID  . cholecalciferol  1,000 Units Oral Daily  . cinacalcet  30 mg Oral Q breakfast  . [START ON 08/24/2015] darbepoetin (ARANESP) injection - DIALYSIS  100 mcg Intravenous Q Mon-HD  . enoxaparin (LOVENOX) injection  30 mg Subcutaneous Q24H  . fentaNYL  12.5 mcg Transdermal Q72H  . hydrocerin   Topical BID  . insulin aspart  0-15 Units Subcutaneous TID WC  . insulin aspart  0-5 Units Subcutaneous QHS  . pantoprazole  40 mg Oral QHS  . senna-docusate  2 tablet Oral QHS   Continuous Infusions:   PRN Meds:.acetaminophen, albuterol, bisacodyl, diphenhydrAMINE, guaiFENesin-dextromethorphan, oxyCODONE, promethazine **OR** promethazine **OR** promethazine, sodium chloride, sodium phosphate, sorbitol, traZODone  Current Labs: reviewed  B Cx x2 from 11/1: NGTD Surgical Cx 11/2: NGTD   Physical Exam:  Blood pressure 146/73, pulse 79, temperature 98.6 F (37 C), temperature source Oral, resp. rate 16, weight 130.4 kg (287 lb 7.7 oz), SpO2 99 %. GEN: NAD, obese, intubated ENT: NCAT EYES: EOMI CV: RRR, no rub PULM: CTAB ABD: s/nt/nd SKIN: No rashes/lesiosn EXT:No LEE RUE with AVF  Outpt HD Orders Unit: [facility] Days: MWF Time: 4h Dialyzer: Elisio EDW: 138kg K/Ca: 2/2.5 Access:  AVF, RFA Needle Size: 14g BFR/DFR: 500/800 VDRA: Hectorol 28mcg qTx EPO: Epogen 5000 units qTx Heparin: 4500 IU at start, then infusion 1000 IU/hr.  A/P 1. ESRD:  1. MWF [place] via AVF- next due Monday 2. AVF now seems fine 3. Holding heparin currently 2. Pathogical Fracture at T4 and T5 1. S/p laminectomy 11/2 2. Likely OM, Cx NGTD in blood and at spine 3. On cefazolin, recent Stah Lugdunesis bacteremia- abx will need to be continued at least 6 weeks- anticipated end date around 12/15 4. 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 HTN/Vol: Aim for EDW lower than previous, BP stable- requiring huge amounts of UF per tx-  4. Anemia: Hb dropping- added aranesp - transfuse if needed 5. MBD: cont PhosLo, calcitriol and cinacaclet,  1. Last PTH 407 2. Cont binder- increased phoslo to 3 with meals- is better  I will not see him on Sunday- will revisit on Monday- call with any concerns tomorrow  Tarica Harl A  08/22/2015, 11:28 AM   Recent Labs Lab 08/17/15 0355 08/19/15 1522 08/21/15 1645  NA 129* 131* 130*  K 4.4 3.9 4.2  CL 92* 94* 93*  CO2 24 24 23   GLUCOSE 161* 156* 141*  BUN 76* 68* 72*  CREATININE 12.14* 9.75* 9.82*  CALCIUM 8.4* 9.0 9.2  PHOS 8.0* 4.7* 3.9    Recent Labs Lab 08/16/15 1520 08/17/15 0355  08/19/15 1522 08/21/15 1645  WBC 13.8* 13.4* 15.3* 16.2*  NEUTROABS 11.6*  --   --   --   HGB 8.0* 7.7* 8.1* 8.5*  HCT 24.7* 24.1* 25.3* 26.3*  MCV 95.4 95.3 94.1 93.3  PLT [phone] 439*

## 2015-08-22 NOTE — Progress Notes (Signed)
Physical Therapy Session Note  Patient Details  Name: Seth [name] MRN: [id number] Date of Birth: 01/08/1966  Today's Date: 08/22/2015 PT Individual Time: 1415-1435 PT Individual Time Calculation (min): 20 min   Short Term Goals: Week 1:  PT Short Term Goal 1 (Week 1): Pt will be able to perform transfer with 1 person assist with LRAD PT Short Term Goal 2 (Week 1): Pt will be able to initiate w/c propulsion PT Short Term Goal 3 (Week 1): Pt will be able to perform sit to stand with +2 assist without use of lift equipment  Skilled Therapeutic Interventions/Progress Updates:  Pt was seen bedside in the pm. Pt transferred sit to stand wih rolling walker x 2, required min A and verbal cues. Pt transferred w/c to edge of bed with sliding board, min A for board placement and S for transfer. Pt transferred edge of bed to supine with S and bedrails. Pt moved up in bed with bed controls, bed rails and S. Pt left sitting up in bed with call bell within reach.   Therapy Documentation Precautions:  Precautions Precautions: Fall, Back Precaution Comments: reviewed/ demonstrated back precautions, however, pt with 0 carry over of education and unable to recall pre-cautions after 5 minutes Restrictions Weight Bearing Restrictions: No General:   Pain: Pt c/o mod pain in R knee.   See Function Navigator for Current Functional Status.   Therapy/Group: Individual Therapy  Dub Amis 08/22/2015, 3:16 PM

## 2015-08-22 NOTE — Progress Notes (Signed)
Orthopedic Tech Progress Note Patient Details:  Seth [name] Aug 14, 1966 [id number]  Ortho Devices Type of Ortho Device: Knee Sleeve Ortho Device/Splint Location: rle Ortho Device/Splint Interventions: Application   Jabree Pernice 08/22/2015, 9:02 AM

## 2015-08-22 NOTE — Progress Notes (Signed)
Occupational Therapy Session Note  Patient Details  Name: Seth [name] MRN: [id number] Date of Birth: [birth year]  Today's Date: 08/22/2015 OT Individual Time: 0700-0800 OT Individual Time Calculation (min): 60 min    Short Term Goals: Week 1:  OT Short Term Goal 1 (Week 1): Pt will complete toileting task with max A +1 OT Short Term Goal 2 (Week 1): Pt will demonstrate carry over of education of spinal pre-cautions with min VCs OT Short Term Goal 3 (Week 1): Pt will complete sit <> stand at sink with max A +1 OT Short Term Goal 4 (Week 1): Pt will transfer to [place] with +1 assist  Skilled Therapeutic Interventions/Progress Updates:    OT session focused on sit<>stand, activity tolerance, BUE strengthening, and functional transfers. Pt received supine in bed requiring increased time and moderate cues for participation in therapy. Completed SBT bed>w/c with CGA and cues for safety and sequencing. Pt propelled self to therapy gym to increase BUE strength and activity tolerance. He required extended time and 2 rest breaks to complete task. Completed sit<>stand 3x at parallel bars with mod assist for first attempt and 2 trials with min assist. Provided verbal cues and manual facilitation for upright posture in standing. Pt propelled self back to room and left with all needs in reach.  Therapy Documentation Precautions:  Precautions Precautions: Fall, Back Precaution Comments: reviewed/ demonstrated back precautions, however, pt with 0 carry over of education and unable to recall pre-cautions after 5 minutes Restrictions Weight Bearing Restrictions: No General:   Vital Signs: Therapy Vitals BP: (!) 146/73 mmHg Pain: Pain Assessment Pain Assessment: 0-10 Pain Score: 10-Worst pain ever Pain Type: Acute pain Pain Location: Knee Pain Orientation: Right Pain Descriptors / Indicators: Sharp;Other (Comment) (buckling) Pain Onset: On-going Pain Intervention(s):  Rest;Emotional support;Distraction Multiple Pain Sites: Yes 2nd Pain Site Pain Score: 10 Pain Type: Acute pain;Surgical pain Pain Location: Back Pain Orientation: Mid Pain Descriptors / Indicators: Sharp Pain Onset: On-going Pain Intervention(s): Rest;Emotional support;Distraction ADL:   Exercises:   Other Treatments:    See Function Navigator for Current Functional Status.   Therapy/Group: Individual Therapy  Duayne Cal 08/22/2015, 10:59 AM

## 2015-08-22 NOTE — Progress Notes (Signed)
Physical Therapy Session Note  Patient Details  Name: Seth [name] MRN: [id number] Date of Birth: December 07, 1965  Today's Date: 08/22/2015 PT Individual Time: 1030-1200 PT Individual Time Calculation (min): 90 min   Short Term Goals: Week 1:  PT Short Term Goal 1 (Week 1): Pt will be able to perform transfer with 1 person assist with LRAD PT Short Term Goal 2 (Week 1): Pt will be able to initiate w/c propulsion PT Short Term Goal 3 (Week 1): Pt will be able to perform sit to stand with +2 assist without use of lift equipment  Skilled Therapeutic Interventions/Progress Updates:  Pt transferred supine to edge of bed with side rail, head of bed elevated and min A with verbal cues. Pt transferred edge of bed to w/c with sliding board, min A for board placement and S to complete transfer. Pt propelled w/c about 150 feet with B UEs and S. Pt c/o increased fatigue, but willing to participate with therapy. Pt has brace for R knee however today c/o increased weakness. Pt performed multiple sit to stand transfers with min A and verbal cues with rolling walker. While standing worked on weight shifting, upright posture and pre gait activities. Pt tolerated standing for up to 2 minutes. Pt ambulated 3 and 5 feet with rolling walker and min A with second person following closely with w/c. Pt performed Kinetron x 5 minutes at 20 cm/second without rest break. Pt propelled w/c back to room with B UEs and S following treatment.   Therapy Documentation Precautions:  Precautions Precautions: Fall, Back Precaution Comments: reviewed/ demonstrated back precautions, however, pt with 0 carry over of education and unable to recall pre-cautions after 5 minutes Restrictions Weight Bearing Restrictions: No General:   Pain: Pt c/o 10/10 pain R knee with WB when asked, bit no c/o pain while WBing.   See Function Navigator for Current Functional Status.   Therapy/Group: Individual Therapy  Dub Amis 08/22/2015, 12:47 PM

## 2015-08-22 NOTE — Progress Notes (Signed)
[place] [facility] PHYSICAL MEDICINE & REHABILITATION     PROGRESS NOTE    Subjective/Complaints: No issues overnite, late night from HD R knee pain but relief with ACE wrap  ROS: Pt denies fever, rash/itching, headache, blurred or double vision, nausea, vomiting, abdominal pain, diarrhea, chest pain, shortness of breath, palpitations, dysuria, dizziness, bleeding, anxiety, or depression   Objective: Vital Signs: Blood pressure 134/79, pulse 79, temperature 98.6 F (37 C), temperature source Oral, resp. rate 16, weight 130.4 kg (287 lb 7.7 oz), SpO2 99 %. No results found.  Recent Labs  08/19/15 1522 08/21/15 1645  WBC 15.3* 16.2*  HGB 8.1* 8.5*  HCT 25.3* 26.3*  PLT 354 439*    Recent Labs  08/19/15 1522 08/21/15 1645  NA 131* 130*  K 3.9 4.2  CL 94* 93*  GLUCOSE 156* 141*  BUN 68* 72*  CREATININE 9.75* 9.82*  CALCIUM 9.0 9.2   CBG (last 3)   Recent Labs  08/21/15 1205 08/21/15 2100 08/22/15 0635  GLUCAP 170* 163* 168*    Wt Readings from Last 3 Encounters:  08/22/15 130.4 kg (287 lb 7.7 oz)  08/17/15 136.8 kg (301 lb 9.4 oz)  08/10/15 139.254 kg (307 lb)    Physical Exam:  Constitutional: He is oriented to person, place, and time. He appears well-developed and well-nourished.  Morbidly obese male lying in bed. NAD.  HENT:  Head: Normocephalic and atraumatic.  Eyes: Conjunctivae and EOM are normal. Pupils are equal, round, and reactive to light.  Neck: Normal range of motion. Neck supple.  Central line left neck remains in place Cardiovascular: Normal rate and regular rhythm.  Respiratory: Effort normal and breath sounds normal. No respiratory distress. He has no wheezes.  GI: Soft. Bowel sounds are normal. He exhibits no distension. There is no tenderness.  Reducible umbilical hernia unchanged Musculoskeletal: He exhibits no edema or tenderness.  Neuro: Strength B/l UE 4+/5 throughout LLE hip flexion 3/5, ankle dorsi/plantar flexion  4/5--unchanged RLE: hip flexion 3-/5, ankle dorsi/plantar flexion 4-/5-unchanged  : He is alert and oriented to person, place, and time.  Speech clear.  Able to follow commands without difficulty.  He is alert and oriented to person, place, and time.  He has normal reflexes.  Intentional tremors BUE. Sensation intact to light touchin all 4 limbs  Skin: Skin is warm and dry. Surgical site with honeycomb dressing, drain with minimal drainage. Multiple healing scabs BLE. Dry flaky skin bilateral feet and thighs.  Psychiatric: He has a normal mood and affect. His speech is normal. Thought content normal. Cognition and memory are normal.     Assessment/Plan: 1. Functional deficits secondary to T4 osteomyelitis with cord compression/myelopathy which require 3+ hours per day of interdisciplinary therapy in a comprehensive inpatient rehab setting. Physiatrist is providing close team supervision and 24 hour management of active medical problems listed below. Physiatrist and rehab team continue to assess barriers to discharge/monitor patient progress toward functional and medical goals.  Function:  Bathing Bathing position   Position: Wheelchair/chair at sink  Bathing parts Body parts bathed by patient: Right arm, Left arm, Chest, Abdomen, Front perineal area, Right upper leg, Left upper leg, Right lower leg, Left lower leg, Buttocks Body parts bathed by helper: Back  Bathing assist Assist Level: 2 helpers      Upper Body Dressing/Undressing Upper body dressing   What is the patient wearing?: Pull over shirt/dress     Pull over shirt/dress - Perfomed by patient: Thread/unthread right sleeve, Thread/unthread left sleeve, Put  head through opening Pull over shirt/dress - Perfomed by helper: Pull shirt over trunk        Upper body assist        Lower Body Dressing/Undressing Lower body dressing   What is the patient wearing?: Underwear, Pants, Shoes Underwear - Performed by  patient: Thread/unthread right underwear leg, Thread/unthread left underwear leg, Pull underwear up/down Underwear - Performed by helper: Thread/unthread left underwear leg, Pull underwear up/down Pants- Performed by patient: Thread/unthread right pants leg, Thread/unthread left pants leg, Pull pants up/down Pants- Performed by helper: Thread/unthread right pants leg, Thread/unthread left pants leg, Pull pants up/down Non-skid slipper socks- Performed by patient: Don/doff right sock, Don/doff left sock (Sock Aid) Non-skid slipper socks- Performed by helper: Don/doff right sock, Don/doff left sock     Shoes - Performed by patient: Don/doff right shoe, Don/doff left shoe Shoes - Performed by helper: Don/doff right shoe, Don/doff left shoe, Fasten right, Fasten left          Lower body assist Assist for lower body dressing: Touching or steadying assistance (Pt > 75%)      Toileting Toileting   Toileting steps completed by patient: Performs perineal hygiene Toileting steps completed by helper: Adjust clothing prior to toileting, Performs perineal hygiene, Adjust clothing after toileting Toileting Assistive Devices: Grab bar or rail  Toileting assist Assist level: Two helpers   Transfers Chair/bed transfer   Chair/bed transfer method: Stand pivot Chair/bed transfer assist level: 2 helpers Chair/bed transfer assistive device: Environmental consultant, Air cabin crew Ambulation activity did not occur: Safety/medical concerns   Max distance: 32 Assist level: 2 helpers (+2 for w/c follow)   Wheelchair   Type: Manual Max wheelchair distance: 150 Assist Level: Supervision or verbal cues  Cognition Comprehension Comprehension assist level: Understands complex 90% of the time/cues 10% of the time  Expression Expression assist level: Expresses complex 90% of the time/cues < 10% of the time  Social Interaction Social Interaction assist level: Interacts appropriately 75 - 89% of the  time - Needs redirection for appropriate language or to initiate interaction.  Problem Solving Problem solving assist level: Solves basic 50 - 74% of the time/requires cueing 25 - 49% of the time  Memory Memory assist level: Recognizes or recalls 75 - 89% of the time/requires cueing 10 - 24% of the time   Medical Problem List and Plan: 1. 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 DVT Prophylaxis/Anticoagulation: Pharmaceutical: Lovenox to continue 3. Pain Management: Continue oxy IR prn. Continue 12.mcg fentanyl patch, right knee sleeve ordered  -activity tolerance improving 4. Mood: LCSW to follow for evaluation and support.  5. Neuropsych: This patient is capable of making decisions on his own behalf. 6. Skin/Wound Care: continuelocal care  7. Fluids/Electrolytes/Nutrition: Monitor I/O. ON renal diet with 1200 cc/FR.   -labs with HD 8. ESRD: On HD MWF. Schedule sessions later in afternoons to help with therapy tolerance.  9. HTN: Monitor BID. Continue Norvasc.  10. Anemia of chronic disease:following up with HD (8.1) 11. DM type 2:   use SSI for elevated BS.   -will discuss oral agent given inadequate control with diet 12. OSA: Continue use of CPAP.  13. Constipation: moved bowels yesterday  -continue lax/soft  -prn sorbitol   LOS (Days) 5 A FACE TO FACE EVALUATION WAS PERFORMED  KIRSTEINS,ANDREW E 08/22/2015 8:00 AM

## 2015-08-22 NOTE — Progress Notes (Signed)
Physical Therapy Session Note  Patient Details  Name: DAVI UMANSKY MRN: [id number] Date of Birth: [birth year]  Today's Date: 08/22/2015 PT Individual Time: 1000-1025 PT Individual Time Calculation (min): 25 min   Short Term Goals: Week 1:  PT Short Term Goal 1 (Week 1): Pt will be able to perform transfer with 1 person assist with LRAD PT Short Term Goal 2 (Week 1): Pt will be able to initiate w/c propulsion PT Short Term Goal 3 (Week 1): Pt will be able to perform sit to stand with +2 assist without use of lift equipment  Skilled Therapeutic Interventions/Progress Updates:    Pt received in bed with R knee brace (soft) in place. Pt initially attempting to refuse PT reporting he did too much the previous day and was up late due to dialysis and is in significant amounts of pain, but eventually agrees to bed level exercises. Therapeutic Exercise - PT instructs pt in B LE ROM and strengthening exercises: ankle pumps, heels slides (gravity minimized with knee in partial ER on R side), supine hip abduction/adduction, hip ER/IR in B hook lie with PT assisting in foot placement: x 10 reps each. Pt ended in bed to rest. Continue per PT POC.   Therapy Documentation Precautions:  Precautions Precautions: Fall, Back Precaution Comments: reviewed/ demonstrated back precautions, however, pt with 0 carry over of education and unable to recall pre-cautions after 5 minutes Restrictions Weight Bearing Restrictions: No Pain: Pain Assessment Pain Assessment: 0-10 Pain Score: 10-Worst pain ever Pain Type: Acute pain Pain Location: Knee Pain Orientation: Right Pain Descriptors / Indicators: Sharp;Other (Comment) (buckling) Pain Onset: On-going Pain Intervention(s): Rest;Emotional support;Distraction Multiple Pain Sites: Yes 2nd Pain Site Pain Score: 10 Pain Type: Acute pain;Surgical pain Pain Location: Back Pain Orientation: Mid Pain Descriptors / Indicators: Sharp Pain Onset: On-going Pain  Intervention(s): Rest;Emotional support;Distraction   See Function Navigator for Current Functional Status.   Therapy/Group: Individual Therapy  Kairon Shock M 08/22/2015, 10:11 AM

## 2015-08-23 ENCOUNTER — Inpatient Hospital Stay (HOSPITAL_COMMUNITY): Payer: Medicare Other | Admitting: Physical Therapy

## 2015-08-23 LAB — GLUCOSE, CAPILLARY
Glucose-Capillary: 168 mg/dL — ABNORMAL HIGH (ref 65–99)
Glucose-Capillary: 171 mg/dL — ABNORMAL HIGH (ref 65–99)
Glucose-Capillary: 174 mg/dL — ABNORMAL HIGH (ref 65–99)
Glucose-Capillary: 206 mg/dL — ABNORMAL HIGH (ref 65–99)

## 2015-08-23 NOTE — Progress Notes (Signed)
Patient states that he will place CPAP on himself, RT filled humidification chamber with sterile water and will continue to monitor.

## 2015-08-23 NOTE — Progress Notes (Signed)
[place] PHYSICAL MEDICINE & REHABILITATION     PROGRESS NOTE    Subjective/Complaints: Patient had a good night last night. Visiting with his son this morning. He vowed to eat more healthy even when he goes home. He has been losing some weight  ROS: Pt denies fever, rash/itching, headache, blurred or double vision, nausea, vomiting, abdominal pain, diarrhea, chest pain, shortness of breath, palpitations, dysuria, dizziness, bleeding, anxiety, or depression   Objective: Vital Signs: Blood pressure 133/70, pulse 90, temperature 97.8 F (36.6 C), temperature source Oral, resp. rate 20, weight 130.1 kg (286 lb 13.1 oz), SpO2 99 %. No results found.  Recent Labs  08/21/15 1645  WBC 16.2*  HGB 8.5*  HCT 26.3*  PLT 439*    Recent Labs  08/21/15 1645  NA 130*  K 4.2  CL 93*  GLUCOSE 141*  BUN 72*  CREATININE 9.82*  CALCIUM 9.2   CBG (last 3)   Recent Labs  08/22/15 1633 08/22/15 2024 08/23/15 0639  GLUCAP 115* 130* 168*    Wt Readings from Last 3 Encounters:  08/23/15 130.1 kg (286 lb 13.1 oz)  08/17/15 136.8 kg (301 lb 9.4 oz)  08/10/15 139.254 kg (307 lb)    Physical Exam:  Constitutional: He is oriented to person, place, and time. He appears well-developed and well-nourished.  Morbidly obese male lying in bed. NAD.  HENT:  Head: Normocephalic and atraumatic.  Eyes: Conjunctivae and EOM are normal. Pupils are equal, round, and reactive to light.  Neck: Normal range of motion. Neck supple.   Cardiovascular: Normal rate and regular rhythm.  Respiratory: Effort normal and breath sounds normal. No respiratory distress. He has no wheezes.  GI: Soft. Bowel sounds are normal. He exhibits no distension. There is no tenderness.   Musculoskeletal: He exhibits no edema or tenderness.  Neuro: Strength B/l UE 4+/5 throughout LLE hip flexion 3/5, ankle dorsi/plantar flexion 4/5--unchanged RLE: hip flexion 3-/5, ankle dorsi/plantar flexion 4-/5-unchanged  : He is  alert and oriented to person, place, and time.  Speech clear.  Able to follow commands without difficulty.  He is alert and oriented to person, place, and time.  He has normal reflexes.  Intentional tremors BUE. Sensation intact to light touchin all 4 limbs  Skin: Skin is warm and dry. Surgical site with honeycomb dressing, drain with minimal drainage. Multiple healing scabs BLE. Dry flaky skin bilateral feet and thighs.  Psychiatric: He has a normal mood and affect. His speech is normal. Thought content normal. Cognition and memory are normal.     Assessment/Plan: 1. Functional deficits secondary to T4 osteomyelitis with cord compression/myelopathy which require 3+ hours per day of interdisciplinary therapy in a comprehensive inpatient rehab setting. Physiatrist is providing close team supervision and 24 hour management of active medical problems listed below. Physiatrist and rehab team continue to assess barriers to discharge/monitor patient progress toward functional and medical goals.  Function:  Bathing Bathing position   Position: Wheelchair/chair at sink  Bathing parts Body parts bathed by patient: Right arm, Left arm, Chest, Abdomen, Front perineal area, Right upper leg, Left upper leg, Right lower leg, Left lower leg, Buttocks Body parts bathed by helper: Back  Bathing assist Assist Level: 2 helpers      Upper Body Dressing/Undressing Upper body dressing   What is the patient wearing?: Pull over shirt/dress     Pull over shirt/dress - Perfomed by patient: Thread/unthread right sleeve, Thread/unthread left sleeve, Put head through opening Pull over shirt/dress - Perfomed by  helper: Pull shirt over trunk        Upper body assist        Lower Body Dressing/Undressing Lower body dressing   What is the patient wearing?: Underwear, Pants, Shoes Underwear - Performed by patient: Thread/unthread right underwear leg, Thread/unthread left underwear leg, Pull underwear  up/down Underwear - Performed by helper: Thread/unthread left underwear leg, Pull underwear up/down Pants- Performed by patient: Thread/unthread right pants leg, Thread/unthread left pants leg, Pull pants up/down Pants- Performed by helper: Thread/unthread right pants leg, Thread/unthread left pants leg, Pull pants up/down Non-skid slipper socks- Performed by patient: Don/doff right sock, Don/doff left sock (Sock Aid) Non-skid slipper socks- Performed by helper: Don/doff right sock, Don/doff left sock     Shoes - Performed by patient: Don/doff right shoe, Don/doff left shoe Shoes - Performed by helper: Don/doff right shoe, Don/doff left shoe, Fasten right, Fasten left          Lower body assist Assist for lower body dressing: Touching or steadying assistance (Pt > 75%)      Toileting Toileting   Toileting steps completed by patient: Performs perineal hygiene Toileting steps completed by helper: Adjust clothing prior to toileting, Performs perineal hygiene, Adjust clothing after toileting Toileting Assistive Devices: Grab bar or rail  Toileting assist Assist level: Two helpers   Transfers Chair/bed transfer   Chair/bed transfer method: Lateral scoot Chair/bed transfer assist level: Touching or steadying assistance (Pt > 75%) Chair/bed transfer assistive device: Sliding board     Locomotion Ambulation Ambulation activity did not occur: Safety/medical concerns   Max distance: 5 Assist level: 2 helpers   Wheelchair   Type: Manual Max wheelchair distance: 150 Assist Level: Supervision or verbal cues  Cognition Comprehension Comprehension assist level: Understands complex 90% of the time/cues 10% of the time  Expression Expression assist level: Expresses complex 90% of the time/cues < 10% of the time  Social Interaction Social Interaction assist level: Interacts appropriately 75 - 89% of the time - Needs redirection for appropriate language or to initiate interaction.  Problem  Solving Problem solving assist level: Solves basic 75 - 89% of the time/requires cueing 10 - 24% of the time  Memory Memory assist level: Recognizes or recalls 75 - 89% of the time/requires cueing 10 - 24% of the time   Medical Problem List and Plan: 1. 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 DVT Prophylaxis/Anticoagulation: Pharmaceutical: Lovenox to continue 3. Pain Management: Continue oxy IR prn. Continue 12.mcg fentanyl patch, right knee sleeve ordered  -activity tolerance improving 4. Mood: LCSW to follow for evaluation and support.  5. Neuropsych: This patient is capable of making decisions on his own behalf. 6. Skin/Wound Care: continuelocal care  7. Fluids/Electrolytes/Nutrition: Monitor I/O. ON renal diet with 1200 cc/FR.   -labs with HD 8. ESRD: On HD MWF. Schedule sessions later in afternoons to help with therapy tolerance.  9. HTN: Monitor BID. Continue Norvasc.  10. Anemia of chronic disease:following up with HD (8.1) 11. DM type 2:   use SSI for elevated BS.   -will discuss oral agent given inadequate control with diet 12. OSA: Continue use of CPAP.  13. Constipation: moved bowels yesterday  -continue lax/soft  -prn sorbitol   LOS (Days) 6 A FACE TO FACE EVALUATION WAS PERFORMED  Alysia Penna E 08/23/2015 10:24 AM

## 2015-08-23 NOTE — Progress Notes (Signed)
ANTIBIOTIC CONSULT NOTE - FOLLOW UP  Pharmacy Consult for ancef Indication: Staph lugdunensis  No Known Allergies  Patient Measurements: Weight: 286 lb 13.1 oz (130.1 kg)  Vital Signs: Temp: 97.8 F (36.6 C) (11/13 0504) Temp Source: Oral (11/13 0504) BP: 133/70 mmHg (11/13 0821) Pulse Rate: 90 (11/13 0504) Intake/Output from previous day: 11/12 0701 - 11/13 0700 In: 960 [P.O.:960] Out: -   Labs:  Recent Labs  08/21/15 1645  WBC 16.2*  HGB 8.5*  PLT 439*  CREATININE 9.82*   Estimated Creatinine Clearance: 11.7 mL/min (by C-G formula based on Cr of 9.82). No results for input(s): VANCOTROUGH, VANCOPEAK, VANCORANDOM, GENTTROUGH, GENTPEAK, GENTRANDOM, TOBRATROUGH, TOBRAPEAK, TOBRARND, AMIKACINPEAK, AMIKACINTROU, AMIKACIN in the last 72 hours.   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 ID saw- plan Cefazolin for 6 weeks with HD. WBC 16.2 on 11/11, Afeb. Needs TTE to be sure no notable vegetation. Echo done 11/6- poor study d/t habitus, pt moving. No vegetation noted on results  9/24: BCx: Staph lugdunensis  11/1 blood: neg 11/1: op cx: neg  Ancef 11/2 >>(09/24/15)  Plan:  - Continue Ancef 2 grams IV QMWF with HD - Follow up HD schedule and tolerance - F/u ID plans for stop date - Monitor [place], PharmD Clinical Pharmacy Resident Pager: [phone] 08/23/2015 9:14 AM

## 2015-08-23 NOTE — Progress Notes (Signed)
Physical Therapy Session Note  Patient Details  Name: Seth [name] MRN: [id number] Date of Birth: 10-Jul-1966  Today's Date: 08/23/2015 PT Individual Time: 1000-1100 PT Individual Time Calculation (min): 60 min   Short Term Goals: Week 1:  PT Short Term Goal 1 (Week 1): Pt will be able to perform transfer with 1 person assist with LRAD PT Short Term Goal 2 (Week 1): Pt will be able to initiate w/c propulsion PT Short Term Goal 3 (Week 1): Pt will be able to perform sit to stand with +2 assist without use of lift equipment  Skilled Therapeutic Interventions/Progress Updates:  Pt was seen bedside in the am. Pt transferred supine to edge of bed with side rail, head of bed elevated and S with verbal cues. Donned R knee support and ace wrap as per pt request as well as shoes at edge of bed. Pt transferred edge of bed to w/c with sliding board and S with verbal cues. Pt propelled w/c to gym about 150 feet with B UEs and S. Pt transferred sit to stand with rolling walker and min A with verbal cues. Pt ambulated 8, 10 and 46 feet with rolling walker and min A with 2nd person w/c follow for safety and verbal cues. Pt utilized Kinetron x 10 minutes at 20 cm/sec without rest break. Pt propelled w/c back to room with B UEs and S.   Therapy Documentation Precautions:  Precautions Precautions: Fall, Back Precaution Comments: reviewed/ demonstrated back precautions, however, pt with 0 carry over of education and unable to recall pre-cautions after 5 minutes Restrictions Weight Bearing Restrictions: No General:   Pain: Pt c/o mod back and R knee pain.   See Function Navigator for Current Functional Status.   Therapy/Group: Individual Therapy  Dub Amis 08/23/2015, 12:46 PM

## 2015-08-24 ENCOUNTER — Inpatient Hospital Stay (HOSPITAL_COMMUNITY): Payer: Medicare Other | Admitting: Occupational Therapy

## 2015-08-24 ENCOUNTER — Inpatient Hospital Stay (HOSPITAL_COMMUNITY): Payer: Medicare Other

## 2015-08-24 LAB — RENAL FUNCTION PANEL
ALBUMIN: 3 g/dL — AB (ref 3.5–5.0)
ANION GAP: 19 — AB (ref 5–15)
BUN: 88 mg/dL — ABNORMAL HIGH (ref 6–20)
CALCIUM: 8.8 mg/dL — AB (ref 8.9–10.3)
CO2: 20 mmol/L — ABNORMAL LOW (ref 22–32)
Chloride: 90 mmol/L — ABNORMAL LOW (ref 101–111)
Creatinine, Ser: 11.28 mg/dL — ABNORMAL HIGH (ref 0.61–1.24)
GFR, EST AFRICAN AMERICAN: 5 mL/min — AB (ref >60)
GFR, EST NON AFRICAN AMERICAN: 5 mL/min — AB (ref >60)
GLUCOSE: 144 mg/dL — AB (ref 65–99)
PHOSPHORUS: 4.4 mg/dL (ref 2.5–4.6)
POTASSIUM: 4.9 mmol/L (ref 3.5–5.1)
Sodium: 129 mmol/L — ABNORMAL LOW (ref 135–145)

## 2015-08-24 LAB — GLUCOSE, CAPILLARY
GLUCOSE-CAPILLARY: 177 mg/dL — AB (ref 65–99)
GLUCOSE-CAPILLARY: 127 mg/dL — AB (ref 65–99)
GLUCOSE-CAPILLARY: 130 mg/dL — AB (ref 65–99)
Glucose-Capillary: 171 mg/dL — ABNORMAL HIGH (ref 65–99)

## 2015-08-24 LAB — CBC
HEMATOCRIT: 26.4 % — AB (ref 39.0–52.0)
HEMOGLOBIN: 8.9 g/dL — AB (ref 13.0–17.0)
MCH: 30.9 pg (ref 26.0–34.0)
MCHC: 33.7 g/dL (ref 30.0–36.0)
MCV: 91.7 fL (ref 78.0–100.0)
Platelets: 458 10*3/uL — ABNORMAL HIGH (ref 150–400)
RBC: 2.88 MIL/uL — AB (ref 4.22–5.81)
RDW: 15.1 % (ref 11.5–15.5)
WBC: 16 10*3/uL — ABNORMAL HIGH (ref 4.0–10.5)

## 2015-08-24 NOTE — Progress Notes (Signed)
Physical Therapy Session Note  Patient Details  Name: Seth [name] MRN: [id number] Date of Birth: Feb 05, 1966  Today's Date: 08/24/2015 PT Individual Time: 1100-1200 PT Individual Time Calculation (min): 60 min   Short Term Goals: Week 1:  PT Short Term Goal 1 (Week 1): Pt will be able to perform transfer with 1 person assist with LRAD PT Short Term Goal 2 (Week 1): Pt will be able to initiate w/c propulsion PT Short Term Goal 3 (Week 1): Pt will be able to perform sit to stand with +2 assist without use of lift equipment  Skilled Therapeutic Interventions/Progress Updates:    Session focused on addressing functional transfers, stair negotiation, sit <-> stands, and endurance and strengthening. Pt performed squat pivot transfer from mat to w/c with extra time and supervision with therapist to stabilize w/c and cues for hand placement. Stair training (3" steps) to prepare for home entry and to address overall strength up/down 1 step x 2, then 3 steps, and then progressed to up/down 7 steps (ascending forward and descending backwards). Discussed progressing to taller steps and trials with single rail on L to simulate home entry though pt denies concerns about being able to do this though required +2 for safety with stair negotiation today. Gait x 2' and then x 10' with min assist with bariatric RW and cues for sequencing with +2 for close w/c follow due to need to sit down suddenly demonstrating improved R knee control when cued to "tighten" up at knee during stance phase. Seated in w/c perform Kinetron x 5 min on 40 cm/sec resistance for functional strengthening.   Therapy Documentation Precautions:  Precautions Precautions: Fall, Back Precaution Comments: reviewed/ demonstrated back precautions, however, pt with 0 carry over of education and unable to recall pre-cautions after 5 minutes Restrictions Weight Bearing Restrictions: No  Pain: C/o back pain - RN made aware at start of session  by previous OT.  See Function Navigator for Current Functional Status.   Therapy/Group: Individual Therapy and Co-Treatment with Recreational Therapy  Canary Brim Ivory Broad, PT, DPT  08/24/2015, 12:13 PM

## 2015-08-24 NOTE — Progress Notes (Signed)
[facility] ROUNDING NOTE   Subjective:   Interval History: patient appears to be doing much better     Objective:  Vital signs in last 24 hours:  Temp:  [98 F (36.7 C)-98.1 F (36.7 C)] 98 F (36.7 C) (11/14 0600) Pulse Rate:  [80-84] 84 (11/14 0600) Resp:  [16-18] 18 (11/14 0600) BP: (139-143)/(69-74) 143/74 mmHg (11/14 0600) SpO2:  [98 %-100 %] 98 % (11/14 0600) Weight:  [137 kg (302 lb 0.5 oz)] 137 kg (302 lb 0.5 oz) (11/14 0600)  Weight change: 6.9 kg (15 lb 3.4 oz) Filed Weights   08/22/15 0544 08/23/15 0504 08/24/15 0600  Weight: 130.4 kg (287 lb 7.7 oz) 130.1 kg (286 lb 13.1 oz) 137 kg (302 lb 0.5 oz)    Intake/Output: I/O last 3 completed shifts: In: 600 [P.O.:600] Out: -    Intake/Output this shift:  Total I/O In: 120 [P.O.:120] Out: -    Obese male  CVS- RRR RS- CTA ABD- BS present soft non-distended EXT- no edema AVF thrill and bruit   Basic Metabolic Panel:  Recent Labs Lab 08/19/15 1522 08/21/15 1645  NA 131* 130*  K 3.9 4.2  CL 94* 93*  CO2 24 23  GLUCOSE 156* 141*  BUN 68* 72*  CREATININE 9.75* 9.82*  CALCIUM 9.0 9.2  PHOS 4.7* 3.9    Liver Function Tests:  Recent Labs Lab 08/19/15 1522 08/21/15 1645  ALBUMIN 2.6* 2.8*   No results for input(s): LIPASE, AMYLASE in the last 168 hours. No results for input(s): AMMONIA in the last 168 hours.  CBC:  Recent Labs Lab 08/19/15 1522 08/21/15 1645  WBC 15.3* 16.2*  HGB 8.1* 8.5*  HCT 25.3* 26.3*  MCV 94.1 93.3  PLT 354 439*    Cardiac Enzymes: No results for input(s): CKTOTAL, CKMB, CKMBINDEX, TROPONINI in the last 168 hours.  BNP: Invalid input(s): POCBNP  CBG:  Recent Labs Lab 08/23/15 0639 08/23/15 1128 08/23/15 1631 08/23/15 2110 08/24/15 0714  GLUCAP 168* 171* 174* 206* 171*    Microbiology: Results for orders placed or performed during the hospital encounter of 08/10/15  Blood culture (routine x 2)     Status: None   Collection Time:  08/11/15  2:45 AM  Result Value Ref Range Status   Specimen Description BLOOD LEFT ARM  Final   Special Requests BOTTLES DRAWN AEROBIC AND ANAEROBIC 5CC  Final   Culture NO GROWTH 5 DAYS  Final   Report Status 08/16/2015 FINAL  Final  Blood culture (routine x 2)     Status: None   Collection Time: 08/11/15  8:27 AM  Result Value Ref Range Status   Specimen Description BLOOD LEFT HAND  Final   Special Requests BOTTLES DRAWN AEROBIC ONLY  5CC  Final   Culture NO GROWTH 5 DAYS  Final   Report Status 08/16/2015 FINAL  Final  Culture, routine-abscess     Status: None   Collection Time: 08/12/15  8:59 PM  Result Value Ref Range Status   Specimen Description ABSCESS  Final   Special Requests THORACIC SPINE  Final   Gram Stain   Final    NO WBC SEEN NO SQUAMOUS EPITHELIAL CELLS SEEN NO ORGANISMS SEEN Performed at [facility]    Culture   Final    NO GROWTH 3 DAYS Performed at [facility]    Report Status 08/16/2015 FINAL  Final  MRSA PCR Screening     Status: None   Collection Time: 08/13/15 12:50 AM  Result Value Ref Range Status   MRSA by PCR NEGATIVE NEGATIVE Final    Comment:        The GeneXpert MRSA Assay (FDA approved for NASAL specimens only), is one component of a comprehensive MRSA colonization surveillance program. It is not intended to diagnose MRSA infection nor to guide or monitor treatment for MRSA infections.     Coagulation Studies: No results for input(s): LABPROT, INR in the last 72 hours.  Urinalysis: No results for input(s): COLORURINE, LABSPEC, PHURINE, GLUCOSEU, HGBUR, BILIRUBINUR, KETONESUR, PROTEINUR, UROBILINOGEN, NITRITE, LEUKOCYTESUR in the last 72 hours.  Invalid input(s): APPERANCEUR    Imaging: No results found.   Medications:     . amLODipine  10 mg Oral Daily  . antiseptic oral rinse  7 mL Mouth Rinse QID  . aspirin  81 mg Oral Daily  . calcitRIOL  0.5 mcg Oral Q M,W,F  . calcium acetate  2,001 mg Oral TID WC   .  ceFAZolin (ANCEF) IV  2 g Intravenous Q M,W,F-HD  . chlorhexidine gluconate  15 mL Mouth Rinse BID  . cholecalciferol  1,000 Units Oral Daily  . cinacalcet  30 mg Oral Q breakfast  . darbepoetin (ARANESP) injection - DIALYSIS  100 mcg Intravenous Q Mon-HD  . enoxaparin (LOVENOX) injection  30 mg Subcutaneous Q24H  . fentaNYL  12.5 mcg Transdermal Q72H  . hydrocerin   Topical BID  . insulin aspart  0-15 Units Subcutaneous TID WC  . insulin aspart  0-5 Units Subcutaneous QHS  . pantoprazole  40 mg Oral QHS  . 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 ESRD:  1. MWF [place] via AVF   Dialysis today 2. AVF   2. Holding heparin currently  Post operatively 3. Pathogical Fracture at T4 and T5 1. S/p laminectomy 11/2 2. 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 On cefazolin, recent Stah Lugdunesis bacteremia- abx will need to be continued at least 6 weeks- anticipated end date around 12/15 4. Only on HD for 2y, seems unlikely for renal osteodystrophy 4. HTN/Vol: Aim for EDW lower than previous, BP stable- requiring huge amounts of UF per tx- 5. Anemia: Hb improved still in the 8 range  added aranesp - transfuse if needed 6. MBD: cont PhosLo, calcitriol and cinacaclet,  calcium 9.2  1. Last PTH 407 2. Cont binder- increased phoslo to 3 with meals- is better   Dialysis has been planned for after rehab at after PM this afternoon   LOS: 7 Charna Neeb,Temme W @TODAY @11 :11 AM

## 2015-08-24 NOTE — Progress Notes (Signed)
Patient states that he can place self on and off CPAP. RT will continue to monitor.

## 2015-08-24 NOTE — Progress Notes (Signed)
Occupational Therapy Session Note  Patient Details  Name: Seth [name] MRN: [id number] Date of Birth: 05-15-1966  Today's Date: 08/24/2015 OT Individual Time: 1000-1100 OT Individual Time Calculation (min): 60 min    Short Term Goals: Week 1:  OT Short Term Goal 1 (Week 1): Pt will complete toileting task with max A +1 OT Short Term Goal 2 (Week 1): Pt will demonstrate carry over of education of spinal pre-cautions with min VCs OT Short Term Goal 3 (Week 1): Pt will complete sit <> stand at sink with max A +1 OT Short Term Goal 4 (Week 1): Pt will transfer to [place] with +1 assist      Skilled Therapeutic Interventions/Progress Updates:    Pt seen this session to develop UE strength and activity tolerance. Pt stated he did not have a clean set of clothing and opted to stay in what he is wearing. Pt wanted to check out the ADL apt setup. He propelled his w/c over 200 ft with extra time. He stated his home bathroom is set up in exactly the same position and he has a tub bench at home. He then was taken to the gym to transfer to the mat with close S as he used a scoot transfer.  Pt sat EOM and worked on UE AROM with various exercises. Limited active shoulder flexion limited by "tight back muscles".  Pt began to have back pain at end of session. PT arrived and nurse informed pt was in pain, but he had just received his medication.  Therapy Documentation Precautions:  Precautions Precautions: Fall, Back Precaution Comments: reviewed/ demonstrated back precautions, however, pt with 0 carry over of education and unable to recall pre-cautions after 5 minutes Restrictions Weight Bearing Restrictions: No     Pain: Pain Assessment Pain Assessment: Faces Pain Score: 8  Faces Pain Scale: Hurts a little bit Pain Type: Acute pain Pain Location: Knee Pain Orientation: Right Pain Descriptors / Indicators: Aching;Sore Pain Frequency: Intermittent Pain Onset: On-going Patients  Stated Pain Goal: 2 Pain Intervention(s): Repositioned;Emotional support ADL:    See Function Navigator for Current Functional Status.   Therapy/Group: Individual Therapy  [place] 08/24/2015, 1:23 PM

## 2015-08-24 NOTE — Progress Notes (Signed)
Occupational Therapy Session Note  Patient Details  Name: Seth [name] MRN: [id number] Date of Birth: [birth year]  Today's Date: 08/24/2015 OT Individual Time: [id number] OT Individual Time Calculation (min): 57 min    Short Term Goals: Week 1:  OT Short Term Goal 1 (Week 1): Pt will complete toileting task with max A +1 OT Short Term Goal 2 (Week 1): Pt will demonstrate carry over of education of spinal pre-cautions with min VCs OT Short Term Goal 3 (Week 1): Pt will complete sit <> stand at sink with max A +1 OT Short Term Goal 4 (Week 1): Pt will transfer to [place] with +1 assist  Skilled Therapeutic Interventions/Progress Updates:    Pt sitting EOB during session.  Had pulled scab off of the right knee and was bleeding slightly.  Therapist applied band aid to site as well as wrapping with ace bandage that pt uses for support.  Performed squat pivot from bed to wheelchair with max assist.  Pt needed to attempts to complete.  Once in the chair pt completed grooming tasks with independence from wheelchair level.  He was able to roll his wheelchair down to the gym with supervision and increased time.  Once in the gym, worked on standing intervals at parallel bars.  Pt needing max assist for standing for intervals of 1 minute.  Pt with increased trunk flexion in standing noted.  Pt needs mod instructional cueing for sequencing standing to sit.  Pt with decreased control of descend when sitting down.    Therapy Documentation Precautions:  Precautions Precautions: Fall, Back Precaution Comments: reviewed/ demonstrated back precautions, however, pt with 0 carry over of education and unable to recall pre-cautions after 5 minutes Restrictions Weight Bearing Restrictions: No  Pain: Pain Assessment Pain Assessment: Faces Pain Score: 5  Faces Pain Scale: Hurts a little bit Pain Type: Acute pain Pain Location: Knee Pain Orientation: Right Pain Descriptors / Indicators:  Aching;Sore Pain Frequency: Intermittent Pain Onset: On-going Patients Stated Pain Goal: 2 Pain Intervention(s): Repositioned;Emotional support ADL: See Function Navigator for Current Functional Status.   Therapy/Group: Individual Therapy  Annielee Jemmott OTR/L 08/24/2015, 12:12 PM

## 2015-08-24 NOTE — Progress Notes (Addendum)
Physical Therapy Session Note  Patient Details  Name: Seth [name] MRN: [id number] Date of Birth: 11/07/1965  Today's Date: 08/24/2015 PT Individual Time: [id number] PT Individual Time Calculation (min): 60 min   Short Term Goals: Week 1:  PT Short Term Goal 1 (Week 1): Pt will be able to perform transfer with 1 person assist with LRAD PT Short Term Goal 2 (Week 1): Pt will be able to initiate w/c propulsion PT Short Term Goal 3 (Week 1): Pt will be able to perform sit to stand with +2 assist without use of lift equipment  Skilled Therapeutic Interventions/Progress Updates:    Session focused on functional w/c mobility to address strength and endurance at supervision level, simulated car transfer to SUV height, and seated LE strengthening exercises with 2# ankle weights. Car transfer required mod assist for sit to stands and assist to bring BLE into the car (due to body habitus and size of this car difficulty getting both LE's in without physical A) and cues to adhere to back precautions. Therex included heel/toe raises, LAQ with 3 second hold and marches x 20 reps each x 2 sets. Discussed overall goals and d/c planning to prepare for conference with team tomorrow.   Therapy Documentation Precautions:  Precautions Precautions: Fall, Back Precaution Comments: reviewed/ demonstrated back precautions, however, pt with 0 carry over of education and unable to recall pre-cautions after 5 minutes Restrictions Weight Bearing Restrictions: No   Pain: Ongoing back and R knee pain - RN administered pain medication.   See Function Navigator for Current Functional Status.   Therapy/Group: Individual Therapy  Canary Brim Ivory Broad, PT, DPT  08/24/2015, 3:20 PM

## 2015-08-24 NOTE — Progress Notes (Signed)
Recreational Therapy Assessment and Plan  Patient Details  Name: Seth [name] MRN: [id number] Date of Birth: 1965/12/11 Today's Date: 08/24/2015  Rehab Potential: Good ELOS: 3 weeks   Assessment Clinical Impression:  Problem List:  Patient Active Problem List   Diagnosis Date Noted  . 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 Discitis of thoracic region   . Septic shock ([place]) 08/13/2015  . Acute respiratory failure with hypoxia ([place]) 08/13/2015  . Cord compression ([place]) 08/11/2015  . Back pain 08/11/2015  . Thoracic spine pain 08/11/2015  . Muscle strain of chest wall 07/21/2015  . Controlled type 2 diabetes mellitus with chronic kidney disease on chronic dialysis ([place]) 07/20/2015  . 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 Absolute anemia   . 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 OSA (obstructive sleep apnea) 10/14/2013  . Tracheostomy status ([place]) 09/17/2013  . Difficult airway for intubation 08/20/2013  . End stage renal disease on dialysis ([place]) 04/30/2012  . Hypocalcemia 04/30/2012  . Normocytic anemia 04/30/2012  . DM II (diabetes mellitus, type II), controlled ([place]) 06/20/2007  . DYSLIPIDEMIA 06/20/2007  . Morbid obesity ([place]) 06/20/2007  . Essential hypertension 06/20/2007    Past Medical History:  Past Medical History  Diagnosis Date  . Hypertension   . ESRD (end stage renal disease) on dialysis ([place])   . 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 Sleep apnea     USES CPAP  . Diabetes mellitus     INSULIN DEPENDENT DIABETES  . Discitis 07/2015   Past Surgical History:  Past Surgical History  Procedure Laterality Date  . Av fistula placement  05/03/2012    Procedure: ARTERIOVENOUS (AV) FISTULA CREATION; Surgeon: Mal [name], MD; Location: [place]; Service: Vascular; Laterality: Right;  . Tracheostomy tube placement N/A 08/20/2013    Procedure: TRACHEOSTOMY Revision; Surgeon: Melida Quitter, MD; Location: [place]; Service: ENT; Laterality: N/A;  . Tee without cardioversion N/A 07/10/2015    Procedure: TRANSESOPHAGEAL ECHOCARDIOGRAM (TEE); Surgeon: Josue Hector, MD; Location[facility] END[facility]COPY; Service: Cardiovascular; Laterality: N/A;    Assessment & Plan Clinical Impression: Patient is a 49 y.o. year old male with recent admission to the hospital with history of DM type 2, ESRD, OSA, recent Staph lugdunensis treated with 2 weeks of IV antibiotics. He was admitted on 08/10/15 with three week history of upper back pain progressing to BLE weakness with tingling and difficulty walking. MRI thoracic spine done revealing discitis-osteomyelitis at T4-5 with endplate destructive changes and vertebral body height loss and retropulsion of inferior margin of T4 and superior margin of T5 into spinal canal with moderate stenosis and cord compression. Patient was taken to OR on 11/02 for T4-T5 decompressive laminectomy with T4 and T5 transpedicular corpectomy and arthrodesis by Dr. Annette Stable. Did not tolerate extubation in PACU requiring re-intubation and placed on Cefzolin for 6 weeks IV therapy per ID input. He tolerated extubation without difficulty on 11/03. 2D echo done for work up revealing EF 65-70% with no wall abnormality and grade 1 diastolic dysfunction. Pt has greatest weakness in his RLE post-op. Patient transferred to CIR on 08/17/2015.     Pt presents with decreased activity tolerance, decreased functional mobility, decreased balance Limiting pt's independence with leisure/community pursuits.   Leisure History/Participation Premorbid leisure interest/current participation: Games - Cards;Community - Grocery store;Community - Glass blower/designer - Other (Comment) Expression Interests: Music  (Comment) Other  Leisure Interests: Television;Movies;Cooking/Baking Leisure Participation Style: With Family/Friends Awareness of Community Resources: Good-identify 3 post discharge leisure resources Psychosocial / Spiritual Patient agreeable to Pet Therapy: Yes Does patient have pets?: Yes Social interaction - Mood/Behavior: Cooperative Engineer, drilling for Education?: Yes Strengths/Weaknesses Patient Strengths/Abilities: Willingness to participate Patient weaknesses: Physical limitations TR Patient demonstrates impairments in the following area(s): Endurance;Motor;Pain;Safety;Skin Integrity  Plan Rec Therapy Plan Is patient appropriate for Therapeutic Recreation?: Yes Rehab Potential: Good Treatment times per week: Min 1 time per week >20 minutes Estimated Length of Stay: 3 weeks TR Treatment/Interventions: Adaptive equipment instruction;1:1 session;Balance/vestibular training;Functional mobility training;Community reintegration;Patient/family education;Therapeutic activities;Recreation/leisure participation;Therapeutic exercise;UE/LE Coordination activities;Wheelchair propulsion/positioning  Recommendations for other services: None  Discharge Criteria: Patient will be discharged from TR if patient refuses treatment 3 consecutive times without medical reason.  If treatment goals not met, if there is a change in medical status, if patient makes no progress towards goals or if patient is discharged from hospital.  The above assessment, treatment plan, treatment alternatives and goals were discussed and mutually agreed upon: by patient  [place] 08/24/2015, 2:15 PM

## 2015-08-24 NOTE — Progress Notes (Signed)
[place] PHYSICAL MEDICINE & REHABILITATION     PROGRESS NOTE    Subjective/Complaints: Good weekend. Pain improving. Confidence increasing. Slept well last night. Ice helpful for right knee   ROS: Pt denies fever, rash/itching, headache, blurred or double vision, nausea, vomiting, abdominal pain, diarrhea, chest pain, shortness of breath, palpitations, dysuria, dizziness, bleeding, anxiety, or depression   Objective: Vital Signs: Blood pressure 143/74, pulse 84, temperature 98 F (36.7 C), temperature source Oral, resp. rate 18, weight 137 kg (302 lb 0.5 oz), SpO2 98 %. No results found.  Recent Labs  08/21/15 1645  WBC 16.2*  HGB 8.5*  HCT 26.3*  PLT 439*    Recent Labs  08/21/15 1645  NA 130*  K 4.2  CL 93*  GLUCOSE 141*  BUN 72*  CREATININE 9.82*  CALCIUM 9.2   CBG (last 3)   Recent Labs  08/23/15 1631 08/23/15 2110 08/24/15 0714  GLUCAP 174* 206* 171*    Wt Readings from Last 3 Encounters:  08/24/15 137 kg (302 lb 0.5 oz)  08/17/15 136.8 kg (301 lb 9.4 oz)  08/10/15 139.254 kg (307 lb)    Physical Exam:  Constitutional: He is oriented to person, place, and time. He appears well-developed and well-nourished.  Morbidly obese male lying in bed. NAD.  HENT:  Head: Normocephalic and atraumatic.  Eyes: Conjunctivae and EOM are normal. Pupils are equal, round, and reactive to light.  Neck: Normal range of motion. Neck supple.   Cardiovascular: Normal rate and regular rhythm.  Respiratory: Effort normal and breath sounds normal. No respiratory distress. He has no wheezes.  GI: Soft. Bowel sounds are normal. He exhibits no distension. There is no tenderness.   Musculoskeletal: He exhibits no edema or tenderness.  Neuro: Strength B/l UE 4+/5 throughout LLE hip flexion 3/5, ankle dorsi/plantar flexion 4/5 RLE: hip flexion 3/5, ankle dorsi/plantar flexion 4/5  He is alert and oriented to person, place, and time.  Speech clear.  Able to follow  commands without difficulty.  He is alert and oriented to person, place, and time.  He has normal reflexes.  Intentional tremors BUE. Sensation intact to light touchin all 4 limbs  Skin: Skin is warm and dry. Surgical site with honeycomb dressing, drain with minimal drainage. Multiple healing scabs BLE are improving. Dry flaky skin still present feet and thighs.  Psychiatric: He has a normal mood and affect. His speech is normal. Thought content normal. Cognition and memory are normal.     Assessment/Plan: 1. Functional deficits secondary to T4 osteomyelitis with cord compression/myelopathy which require 3+ hours per day of interdisciplinary therapy in a comprehensive inpatient rehab setting. Physiatrist is providing close team supervision and 24 hour management of active medical problems listed below. Physiatrist and rehab team continue to assess barriers to discharge/monitor patient progress toward functional and medical goals.  Function:  Bathing Bathing position   Position: Wheelchair/chair at sink  Bathing parts Body parts bathed by patient: Right arm, Left arm, Chest, Abdomen, Front perineal area, Right upper leg, Left upper leg, Right lower leg, Left lower leg, Buttocks Body parts bathed by helper: Back  Bathing assist Assist Level: 2 helpers      Upper Body Dressing/Undressing Upper body dressing   What is the patient wearing?: Pull over shirt/dress     Pull over shirt/dress - Perfomed by patient: Thread/unthread right sleeve, Thread/unthread left sleeve, Put head through opening Pull over shirt/dress - Perfomed by helper: Pull shirt over trunk  Upper body assist        Lower Body Dressing/Undressing Lower body dressing   What is the patient wearing?: Underwear, Pants, Shoes Underwear - Performed by patient: Thread/unthread right underwear leg, Thread/unthread left underwear leg, Pull underwear up/down Underwear - Performed by helper: Thread/unthread left  underwear leg, Pull underwear up/down Pants- Performed by patient: Thread/unthread right pants leg, Thread/unthread left pants leg, Pull pants up/down Pants- Performed by helper: Thread/unthread right pants leg, Thread/unthread left pants leg, Pull pants up/down Non-skid slipper socks- Performed by patient: Don/doff right sock, Don/doff left sock (Sock Aid) Non-skid slipper socks- Performed by helper: Don/doff right sock, Don/doff left sock     Shoes - Performed by patient: Don/doff right shoe, Don/doff left shoe Shoes - Performed by helper: Don/doff right shoe, Don/doff left shoe, Fasten right, Fasten left          Lower body assist Assist for lower body dressing: Touching or steadying assistance (Pt > 75%)      Toileting Toileting Toileting activity did not occur: No continent bowel/bladder event (no bowel, urinal for urine) Toileting steps completed by patient: Performs perineal hygiene Toileting steps completed by helper: Adjust clothing prior to toileting, Performs perineal hygiene, Adjust clothing after toileting Toileting Assistive Devices: Grab bar or rail  Toileting assist Assist level: Two helpers   Transfers Chair/bed transfer   Chair/bed transfer method: Lateral scoot Chair/bed transfer assist level: Supervision or verbal cues Chair/bed transfer assistive device: Sliding board     Locomotion Ambulation Ambulation activity did not occur: Safety/medical concerns   Max distance: 46 Assist level: 2 helpers   Wheelchair   Type: Manual Max wheelchair distance: 150 Assist Level: Supervision or verbal cues  Cognition Comprehension Comprehension assist level: Understands complex 90% of the time/cues 10% of the time  Expression Expression assist level: Expresses complex 90% of the time/cues < 10% of the time  Social Interaction Social Interaction assist level: Interacts appropriately 75 - 89% of the time - Needs redirection for appropriate language or to initiate  interaction.  Problem Solving Problem solving assist level: Solves basic 75 - 89% of the time/requires cueing 10 - 24% of the time  Memory Memory assist level: Recognizes or recalls 75 - 89% of the time/requires cueing 10 - 24% of the time   Medical Problem List and Plan: 1. 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 DVT Prophylaxis/Anticoagulation: Pharmaceutical: Lovenox to continue 3. Pain Management: Continue oxy IR prn. Continue 12.mcg fentanyl patch, right knee sleeve ordered---using ice effectively also  -activity tolerance improving 4. Mood: LCSW to follow for evaluation and support.  5. Neuropsych: This patient is capable of making decisions on his own behalf. 6. Skin/Wound Care: continuelocal care  7. Fluids/Electrolytes/Nutrition: Monitor I/O. ON renal diet with 1200 cc/FR.   -labs with HD 8. ESRD: On HD MWF. Schedule sessions later in afternoons to help with therapy tolerance.  9. HTN: Monitor BID. Continue Norvasc.  10. Anemia of chronic disease:following up with HD (8.1) 11. DM type 2:   use SSI for elevated BS.   -will need an oral agent likely. Discuss with him today 12. OSA: Continue use of CPAP.  13. Constipation: trying to stay ahead  -continue lax/soft  -prn sorbitol   LOS (Days) 7 A FACE TO FACE EVALUATION WAS PERFORMED  SWARTZ,ZACHARY T 08/24/2015 9:10 AM

## 2015-08-25 ENCOUNTER — Inpatient Hospital Stay (HOSPITAL_COMMUNITY): Payer: Non-veteran care | Admitting: Occupational Therapy

## 2015-08-25 ENCOUNTER — Inpatient Hospital Stay (HOSPITAL_COMMUNITY): Payer: Medicare Other | Admitting: Physical Therapy

## 2015-08-25 ENCOUNTER — Inpatient Hospital Stay (HOSPITAL_COMMUNITY): Payer: Non-veteran care

## 2015-08-25 ENCOUNTER — Inpatient Hospital Stay (HOSPITAL_COMMUNITY): Payer: Medicare Other

## 2015-08-25 ENCOUNTER — Inpatient Hospital Stay (HOSPITAL_COMMUNITY): Payer: Medicare Other | Admitting: *Deleted

## 2015-08-25 LAB — GLUCOSE, CAPILLARY
GLUCOSE-CAPILLARY: 169 mg/dL — AB (ref 65–99)
GLUCOSE-CAPILLARY: 181 mg/dL — AB (ref 65–99)
Glucose-Capillary: 163 mg/dL — ABNORMAL HIGH (ref 65–99)
Glucose-Capillary: 201 mg/dL — ABNORMAL HIGH (ref 65–99)

## 2015-08-25 LAB — RENAL FUNCTION PANEL
Albumin: 2.8 g/dL — ABNORMAL LOW (ref 3.5–5.0)
Anion gap: 18 — ABNORMAL HIGH (ref 5–15)
BUN: 108 mg/dL — AB (ref 6–20)
CHLORIDE: 90 mmol/L — AB (ref 101–111)
CO2: 20 mmol/L — AB (ref 22–32)
CREATININE: 13.04 mg/dL — AB (ref 0.61–1.24)
Calcium: 8.6 mg/dL — ABNORMAL LOW (ref 8.9–10.3)
GFR calc Af Amer: 4 mL/min — ABNORMAL LOW (ref >60)
GFR, EST NON AFRICAN AMERICAN: 4 mL/min — AB (ref >60)
GLUCOSE: 198 mg/dL — AB (ref 65–99)
POTASSIUM: 5.2 mmol/L — AB (ref 3.5–5.1)
Phosphorus: 5.3 mg/dL — ABNORMAL HIGH (ref 2.5–4.6)
Sodium: 128 mmol/L — ABNORMAL LOW (ref 135–145)

## 2015-08-25 LAB — CBC
HEMATOCRIT: 25 % — AB (ref 39.0–52.0)
Hemoglobin: 8.1 g/dL — ABNORMAL LOW (ref 13.0–17.0)
MCH: 29.8 pg (ref 26.0–34.0)
MCHC: 32.4 g/dL (ref 30.0–36.0)
MCV: 91.9 fL (ref 78.0–100.0)
PLATELETS: 434 10*3/uL — AB (ref 150–400)
RBC: 2.72 MIL/uL — ABNORMAL LOW (ref 4.22–5.81)
RDW: 15 % (ref 11.5–15.5)
WBC: 13.8 10*3/uL — AB (ref 4.0–10.5)

## 2015-08-25 MED ORDER — IOHEXOL 300 MG/ML  SOLN
100.0000 mL | Freq: Once | INTRAMUSCULAR | Status: DC | PRN
  Administered 2015-08-25: 32 mL via INTRAVENOUS
  Filled 2015-08-25: qty 100

## 2015-08-25 MED ORDER — DARBEPOETIN ALFA 100 MCG/0.5ML IJ SOSY
PREFILLED_SYRINGE | INTRAMUSCULAR | Status: AC
  Filled 2015-08-25: qty 0.5

## 2015-08-25 NOTE — Progress Notes (Signed)
[place] PHYSICAL MEDICINE & REHABILITATION     PROGRESS NOTE    Subjective/Complaints: Continues to progress in therapy. Had difficulties in HD--states they couldn't get access and that he will go back today. (i see no documentation as such in chart from HD).   ROS: Pt denies fever, rash/itching, headache, blurred or double vision, nausea, vomiting, abdominal pain, diarrhea, chest pain, shortness of breath, palpitations, dysuria, dizziness, bleeding, anxiety, or depression   Objective: Vital Signs: Blood pressure 140/78, pulse 80, temperature 97.6 F (36.4 C), temperature source Oral, resp. rate 18, weight 139.9 kg (308 lb 6.8 oz), SpO2 100 %. No results found.  Recent Labs  08/24/15 1600  WBC 16.0*  HGB 8.9*  HCT 26.4*  PLT 458*    Recent Labs  08/24/15 1600  NA 129*  K 4.9  CL 90*  GLUCOSE 144*  BUN 88*  CREATININE 11.28*  CALCIUM 8.8*   CBG (last 3)   Recent Labs  08/24/15 1645 08/24/15 2043 08/25/15 0647  GLUCAP 127* 130* 163*    Wt Readings from Last 3 Encounters:  08/25/15 139.9 kg (308 lb 6.8 oz)  08/17/15 136.8 kg (301 lb 9.4 oz)  08/10/15 139.254 kg (307 lb)    Physical Exam:  Constitutional: He is oriented to person, place, and time. He appears well-developed and well-nourished.  Morbidly obese male lying in bed. NAD.  HENT:  Head: Normocephalic and atraumatic.  Eyes: Conjunctivae and EOM are normal. Pupils are equal, round, and reactive to light.  Neck: Normal range of motion. Neck supple.   Cardiovascular: Normal rate and regular rhythm.  Respiratory: Effort normal and breath sounds normal. No respiratory distress. He has no wheezes.  GI: Soft. Bowel sounds are normal. He exhibits no distension. There is no tenderness.   Musculoskeletal: right knee with pain during PROM.  Neuro: Strength B/l UE 4+/5 throughout LLE hip flexion 4/5, ankle dorsi/plantar flexion 4+/5 RLE: hip flexion 4-/5, ankle dorsi/plantar flexion 4+/5  He is alert  and oriented to person, place, and time.  Speech clear.  Able to follow commands without difficulty.  He is alert and oriented to person, place, and time.  He has normal reflexes.  Intentional tremors BUE. Sensation intact to light touchin all 4 limbs  Skin: Skin is warm and dry. Surgical site with honeycomb dressing, drain with minimal drainage.   BLE are improving. Dry flaky skin still present feet and thighs.  Psychiatric: He has a normal mood and affect. His speech is normal. Thought content normal. Cognition and memory are normal.     Assessment/Plan: 1. Functional deficits secondary to T4 osteomyelitis with cord compression/myelopathy which require 3+ hours per day of interdisciplinary therapy in a comprehensive inpatient rehab setting. Physiatrist is providing close team supervision and 24 hour management of active medical problems listed below. Physiatrist and rehab team continue to assess barriers to discharge/monitor patient progress toward functional and medical goals.  Function:  Bathing Bathing position   Position: Shower  Bathing parts Body parts bathed by patient: Right arm, Left arm, Chest, Abdomen, Front perineal area, Right upper leg, Left upper leg, Right lower leg, Left lower leg, Back Body parts bathed by helper: Buttocks  Bathing assist Assist Level: Touching or steadying assistance(Pt > 75%)      Upper Body Dressing/Undressing Upper body dressing   What is the patient wearing?: Pull over shirt/dress     Pull over shirt/dress - Perfomed by patient: Thread/unthread right sleeve, Thread/unthread left sleeve, Put head through opening, Pull shirt  over trunk Pull over shirt/dress - Perfomed by helper: Pull shirt over trunk        Upper body assist Assist Level: Supervision or verbal cues      Lower Body Dressing/Undressing Lower body dressing   What is the patient wearing?: Underwear, Pants, Shoes, Socks Underwear - Performed by patient:  Thread/unthread right underwear leg, Thread/unthread left underwear leg Underwear - Performed by helper: Pull underwear up/down Pants- Performed by patient: Thread/unthread left pants leg Pants- Performed by helper: Thread/unthread right pants leg, Pull pants up/down Non-skid slipper socks- Performed by patient: Don/doff right sock, Don/doff left sock Non-skid slipper socks- Performed by helper: Don/doff right sock, Don/doff left sock     Shoes - Performed by patient: Don/doff left shoe, Don/doff right shoe Shoes - Performed by helper: Fasten right, Fasten left, Don/doff right shoe, Don/doff left shoe          Lower body assist Assist for lower body dressing: Touching or steadying assistance (Pt > 75%)      Toileting Toileting Toileting activity did not occur: No continent bowel/bladder event (no bowel, urinal for urine) Toileting steps completed by patient: Performs perineal hygiene Toileting steps completed by helper: Adjust clothing prior to toileting, Performs perineal hygiene, Adjust clothing after toileting Toileting Assistive Devices: Grab bar or rail  Toileting assist Assist level: Two helpers   Transfers Chair/bed transfer   Chair/bed transfer method: Squat pivot Chair/bed transfer assist level: Supervision or verbal cues Chair/bed transfer assistive device: Armrests     Locomotion Ambulation Ambulation activity did not occur: Safety/medical concerns   Max distance: 10 Assist level: 2 helpers   Wheelchair   Type: Manual Max wheelchair distance: 150 Assist Level: Supervision or verbal cues  Cognition Comprehension Comprehension assist level: Understands complex 90% of the time/cues 10% of the time  Expression Expression assist level: Expresses complex 90% of the time/cues < 10% of the time  Social Interaction Social Interaction assist level: Interacts appropriately 75 - 89% of the time - Needs redirection for appropriate language or to initiate interaction.   Problem Solving Problem solving assist level: Solves basic 75 - 89% of the time/requires cueing 10 - 24% of the time  Memory Memory assist level: Recognizes or recalls 75 - 89% of the time/requires cueing 10 - 24% of the time   Medical Problem List and Plan: 1. 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 DVT Prophylaxis/Anticoagulation: Pharmaceutical: Lovenox to continue 3. Pain Management: Continue oxy IR prn. Continue 12.mcg fentanyl patch, right knee sleeve ordered---using ice effectively also  -activity tolerance improving  -sleeve/brace for right knee 4. Mood: LCSW to follow for evaluation and support.  5. Neuropsych: This patient is capable of making decisions on his own behalf. 6. Skin/Wound Care: continuelocal care  7. Fluids/Electrolytes/Nutrition: Monitor I/O. ON renal diet with 1200 cc/FR.   -labs with HD 8. ESRD: On HD MWF. Schedule sessions later in afternoons to help with therapy tolerance.  9. HTN: Monitor BID. Continue Norvasc.  10. Anemia of chronic disease:following up with HD (8.1) 11. DM type 2:   use SSI for elevated BS.   -consider an oral agent although sugars improved yesterday 12. OSA: Continue use of CPAP.  13. Constipation: trying to stay ahead  -continue lax/soft  -prn sorbitol   LOS (Days) 8 A FACE TO FACE EVALUATION WAS PERFORMED  SWARTZ,ZACHARY T 08/25/2015 9:25 AM

## 2015-08-25 NOTE — Progress Notes (Signed)
Physical Therapy Session Note  Patient Details  Name: Seth [name] MRN: [id number] Date of Birth: 29-Jul-1966  Today's Date: 08/25/2015 PT Individual Time: 0900-1000 &   1100-1200 PT Individual Time Calculation (min): 60 min and 60 min total of 120 minutes.   Short Term Goals: Week 1:  PT Short Term Goal 1 (Week 1): Pt will be able to perform transfer with 1 person assist with LRAD PT Short Term Goal 2 (Week 1): Pt will be able to initiate w/c propulsion PT Short Term Goal 3 (Week 1): Pt will be able to perform sit to stand with +2 assist without use of lift equipment   Therapy Documentation Precautions:  Precautions Precautions: Fall, Back Precaution Comments: reviewed/ demonstrated back precautions, however, pt with 0 carry over of education and unable to recall pre-cautions after 5 minutes Restrictions Weight Bearing Restrictions: No Pain: Pain Assessment Pain Assessment: 0-10 Pain Score: 10-Worst pain ever Pain Type: Acute pain Pain Location: Knee Pain Orientation: Right Pain Descriptors / Indicators: Aching Pain Intervention(s): Therapeutic touch;Rest;Repositioned;Distraction Multiple Pain Sites: Yes 2nd Pain Site Pain Score: 7 Pain Type: Surgical pain Pain Location: Back Pain Orientation: Mid;Upper Pain Frequency: Intermittent   Session 1: 0900-1000:  Patient received in wheelchair; patient reporting increased pain however declined to have RN provide pain medication. Patient propelled wheelchair 200 feet to therapy gym with supervision and verbal cues for obstacle negotiation and environmental awareness. Patient with increased time to complete task. Patient performed squat pivot transfer to and from couch mod assist to the left and max assist to the right. Patient educated and returned demonstration on wheelchair management, sequence and technique for squat pivot transfer. Patient patient required min verbal cues and assistance for wheelchair management. Sit to and  from stand transfer with heavy duty RW min-mod assist fluctuating throughout session secondary to increased pain and improved technique and sequencing. Patient performed dynamic standing activities in one minute and two minute trials including weight shifting and concentric and eccentric quad control bilaterally with min to mod assist. Patient performed stand pivot transfer with heavy duty RW to and from mat mod assist. Patient brought back to room at end of session with all needs met. Patient able to recall and demonstrate spinal precautions at all times. Patient required frequent rest breaks throughout session.  Session 2: 1100-1200:  Patient received in wheelchair reports fatigue from previous sessions. Patient propelled wheelchair 150 feet to therapy gym with supervision and verbal cues for obstacle negotiation and environmental awareness. Patient with increased time to complete task. Sit to and from stand transfer with heavy duty RW min-mod assist fluctuating throughout session secondary to increased pain and improved technique and sequencing. Patient performed two trials of ambulation 3 feet and 8 feet respectively mod to max assist with +2 for wheelchair follow. Patient limited significantly by pain in right knee reporting he did not want to take any pain medication. Patient educated on use of prescribed pain medication from MD in accordance with therapy may improve participation and exercise tolerance. Patient in agreement and stated he would ask his RN after session. Patient performed dynamic standing activityx3 with Rec therapist and PT playing carding focusing on reaching, posture, coordination, balance, and standing tolerance. Patient performed wheelchair push-up for 30 seconds and education to perform every 30 minutes with assistance for safety at this time. Patient educated on making sure wheelchair  Was locked prior to attempting and to have therapist or nursing staff present. Patient verbalized  understanding. Patient provided J3 cushion for improved  positioning and pressure relief. Patient brought back to room at end of session with all needs met. Patient able to recall and demonstrate spinal precautions at all times.  Patient required frequent rest breaks throughout session.  See Function Navigator for Current Functional Status.   Therapy/Group: Individual Therapy  Retta Diones 08/25/2015, 12:09 PM

## 2015-08-25 NOTE — Progress Notes (Addendum)
Occupational Therapy Session Note  Patient Details  Name: Seth [name] MRN: [id number] Date of Birth: 08/11/1966  Today's Date: 08/25/2015 OT Individual Time:  - 7:30-8:30 (61min)      Short Term Goals: Week 1:  OT Short Term Goal 1 (Week 1): Pt will complete toileting task with max A +1 OT Short Term Goal 2 (Week 1): Pt will demonstrate carry over of education of spinal pre-cautions with min VCs OT Short Term Goal 3 (Week 1): Pt will complete sit <> stand at sink with max A +1 OT Short Term Goal 4 (Week 1): Pt will transfer to [place] with +1 assist  Skilled Therapeutic Interventions/Progress Updates:    1:1 Pt at the EOB when arrived. Self care retraining at shower level in the ADL apartment. Pt performed slide board transfer bed to w/c with supervision with A for proper board placement and to stabilize w/c. Pt able to propel self down to the ADL apartment with supervision with extra time. Pt perform scoot pivot transfer from w/c to tub bench with mod A with clothing on.  Performed lateral leans to doff clothing with assistance for right LE due to discomfort in the knee. Pt bathed with supervision with long handled sponge 9/10 parts.  Pt requires A for bathing  Bottom. Pt threading pants sitting in the shower and then transitioned out of the tub with A for management of right LE. Pt performed sit to stand with mod A with RW and total A for pulling up underwear and pants.  Pt perform squat pivot transfer into w/c with mod A with VC for proper setup. Pt performed grooming at sink mod I. Left in w/c with call bell.   Therapy Documentation Precautions:  Precautions Precautions: Fall, Back Precaution Comments: reviewed/ demonstrated back precautions, however, pt with 0 carry over of education and unable to recall pre-cautions after 5 minutes Restrictions Weight Bearing Restrictions: No Pain:  soreness in right knee but able to continue   See Function Navigator for Current  Functional Status.   Therapy/Group: Individual Therapy  Willeen Cass [facility] 08/25/2015, 9:12 AM

## 2015-08-25 NOTE — Patient Care Conference (Signed)
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 Cord compression myelopathy ([place]) 08/17/2015  . 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 Discitis of thoracic region   . Septic shock ([place]) 08/13/2015  . Acute respiratory failure with hypoxia ([place]) 08/13/2015  . Cord compression ([place]) 08/11/2015  . Back pain 08/11/2015  . Thoracic spine pain 08/11/2015  . Muscle strain of chest wall 07/21/2015  . Controlled type 2 diabetes mellitus with chronic kidney disease on chronic dialysis ([place]) 07/20/2015  . 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 Absolute anemia   . 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 OSA (obstructive sleep apnea) 10/14/2013  . Tracheostomy status ([place]) 09/17/2013  . Difficult airway for intubation 08/20/2013  . End stage renal disease on dialysis ([place]) 04/30/2012  . Hypocalcemia 04/30/2012  . Normocytic anemia 04/30/2012  . DM II (diabetes mellitus, type II), controlled ([place]) 06/20/2007  . DYSLIPIDEMIA 06/20/2007  . Morbid obesity ([place]) 06/20/2007  . Essential hypertension 06/20/2007    Expected Discharge Date: Expected Discharge Date: 09/09/15  Team Members Present: Physician leading conference: Dr. Alger Simons Social Worker Present: Lennart Pall, LCSW Nurse Present: Heather Roberts, RN PT Present: Jorge Mandril, PT OT Present: Willeen Cass, OT SLP Present: Weston Anna, SLP PPS Coordinator present : Daiva Nakayama, RN, CRRN     Current Status/Progress Goal Weekly Team Focus  Medical   improved  movement. still reluctant  to take pain meds. shunt issues currently  improve strength, improved pain control  shunt patency, right knee pain---sleeve   Bowel/Bladder   continent of bowel and bladder; LBM 08/22/15, Pt. Oliguic HD MWF  Pt to remain continent of bowel and bladder  Monitor for s/s of constipation r/t pain medication   Swallow/Nutrition/ Hydration             ADL's   mod A stand pivot transfer with RW, LB dressing with min to mod A,   min overall; supervision for toilet transfers   sit to stands, standing tolerance and upright posture, activity tolerance   Mobility   min to mod assist for transfers; min assist +2 for gait short distances (+2 for close w/c follow); +2 assist for stairs   S to min A overall; upgraded goals for ambulation short distances and added stair goal for home entry (mod A)  tranfsers, endurance, pain management, strengthening, sit to stands, balance, stairs, gait,   Communication             Safety/Cognition/ Behavioral Observations            Pain   5-10mg  Oxy IR q 4hr PRN,  <5  assess pain q 4 hr and prn, monitor for nonverbal cues of pain   Skin   Steri's to T4-T5 lami, OTA, visible old blood  No additional skin breakdown  assess skin q shift and  prn    Rehab Goals Patient on target to meet rehab goals: Yes *See Care Plan and progress notes for long and short-term goals.  Barriers to Discharge: pain, renal issues    Possible Resolutions to Barriers:  using pain meds before therapy, continued positive reinforcement, orthotics    Discharge Planning/Teaching Needs:  Pt reports he will dc home with family and friends providing "any assistance I need." Making gains with tx and appears some goals are being upgraded.  Ed sessions to be scheduled.   Team Discussion:  Doing better  the past few days.  Most goals have been upgraded to supervision/min assist gait.  Pt  Continues to be reluctant to take pain meds.  On track for goals and d/c date.  Revisions to Treatment Plan:  Several goals upgraded.   Continued Need for Acute Rehabilitation Level of Care: The patient requires daily medical management by a physician with specialized training in physical medicine and rehabilitation for the following conditions: Daily direction of a multidisciplinary physical rehabilitation program to ensure safe treatment while eliciting the highest outcome that is of practical value to the patient.: Yes Daily medical management of patient stability for increased activity during participation in an intensive rehabilitation regime.: Yes Daily analysis of laboratory values and/or radiology reports with any subsequent need for medication adjustment of medical intervention for : Neurological problems;Post surgical problems;Other  Ahmari Garton, [place] 08/25/2015, 3:41 PM

## 2015-08-25 NOTE — Progress Notes (Signed)
CPAP if bedside and ready for use.  Patient said he would place on himself when ready for bed.

## 2015-08-25 NOTE — Procedures (Signed)
I have seen and examined this patient and agree with the plan of care . Patient seen on dialysis this afternoon . Fistulogram performed earlier with no intervention   Vitals  BP 140/80   No edema   K 4.9   Hb 8.9    darbe  15mcg weekly  Seth [name],Seth [name] 08/25/2015, 4:36 PM

## 2015-08-25 NOTE — Progress Notes (Signed)
Social Work Patient ID: [name], male   DOB: 11-26-1965, 49 y.o.   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  Cord compression myelopathy ([place])  08/17/2015   .  Osteomyelitis of thoracic spine ([place])  08/16/2015   .  Discitis of thoracic region     .  Septic shock ([place])  08/13/2015   .  Acute respiratory failure with hypoxia ([place])  08/13/2015   .  Cord compression ([place])  08/11/2015   .  Back pain  08/11/2015   .  Thoracic spine pain  08/11/2015   .  Muscle strain of chest wall  07/21/2015   .  Controlled type 2 diabetes mellitus with chronic kidney disease on chronic dialysis ([place])  07/20/2015   .  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  Absolute anemia     .  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  OSA (obstructive sleep apnea)  10/14/2013   .  Tracheostomy status ([place])  09/17/2013   .  Difficult airway for intubation  08/20/2013   .  End stage renal disease on dialysis ([place])  04/30/2012   .  Hypocalcemia  04/30/2012   .  Normocytic anemia  04/30/2012   .  DM II (diabetes mellitus, type II), controlled ([place])  06/20/2007   .  DYSLIPIDEMIA  06/20/2007   .  Morbid obesity ([place])  06/20/2007   .  Essential  hypertension  06/20/2007     Expected Discharge Date: Expected Discharge Date: 09/09/15  Team Members Present: Physician leading conference: Dr. Alger Simons Social Worker Present: Lennart Pall, LCSW Nurse Present: Heather Roberts, RN PT Present: Jorge Mandril, PT OT Present: Willeen Cass, OT SLP Present: Weston Anna, SLP PPS Coordinator present : Daiva Nakayama, RN, CRRN        Current Status/Progress  Goal  Weekly Team Focus   Medical     improved movement. still reluctant  to take pain meds. shunt issues currently  improve strength, improved pain control   shunt patency, right knee pain---sleeve    Bowel/Bladder     continent of bowel and bladder; LBM 08/22/15, Pt. Oliguic HD MWF  Pt to remain continent of bowel and bladder   Monitor for s/s of constipation r/t pain medication    Swallow/Nutrition/ Hydration               ADL's     mod A stand pivot transfer with RW, LB dressing with min to mod A,   min overall; supervision for toilet transfers   sit to stands, standing tolerance and upright posture, activity tolerance  Mobility     min to mod assist for transfers; min assist +2 for gait short distances (+2 for close w/c follow); +2 assist for stairs    S to min A overall; upgraded goals for ambulation short distances and added stair goal for home entry (mod A)  tranfsers, endurance, pain management, strengthening, sit to stands, balance, stairs, gait,   Communication               Safety/Cognition/ Behavioral Observations              Pain     5-10mg  Oxy IR q 4hr PRN,  <5  assess pain q 4 hr and prn, monitor for nonverbal cues of pain    Skin     Steri's to T4-T5 lami, OTA, visible old blood   No additional skin breakdown  assess skin q shift and prn    Rehab Goals Patient on target to meet rehab goals: Yes *See Care Plan and progress notes for long and short-term goals.    Barriers to Discharge:  pain, renal issues     Possible Resolutions to Barriers:   using  pain meds before therapy, continued positive reinforcement, orthotics     Discharge Planning/Teaching Needs:   Pt reports he will dc home with family and friends providing "any assistance I need." Making gains with tx and appears some goals are being upgraded.  Ed sessions to be scheduled.    Team Discussion:    Doing better the past few days.  Most goals have been upgraded to supervision/min assist gait.  Pt  Continues to be reluctant to take pain meds.  On track for goals and d/c date.   Revisions to Treatment Plan:    Several goals upgraded.    Continued Need for Acute Rehabilitation Level of Care: The patient requires daily medical management by a physician with specialized training in physical medicine and rehabilitation for the following conditions: Daily direction of a multidisciplinary physical rehabilitation program to ensure safe treatment while eliciting the highest outcome that is of practical value to the patient.: Yes Daily medical management of patient stability for increased activity during participation in an intensive rehabilitation regime.: Yes Daily analysis of laboratory values and/or radiology reports with any subsequent need for medication adjustment of medical intervention for : Neurological problems;Post surgical problems;Other  Sherlonda Flater 08/25/2015, 3:41 PM                 Lowella Curb, LCSW Social Worker Signed  Patient Care Conference 08/18/2015  4:20 PM    Expand All Collapse All   Inpatient RehabilitationTeam Conference and Plan of Care Update Date: 08/18/2015   Time: 2:10 PM     Patient Name: Seth [name]       Medical Record Number: [id number]  Date of Birth: Feb 27, 1966 Sex: Male         Room/Bed: 4W10C/4W10C-01 Payor Info: Payor: MEDICARE / Plan: MEDICARE PART A AND B / Product Type: *No Product type* /    Admitting Diagnosis: 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  Cord compression myelopathy ([place])  08/17/2015   .  Osteomyelitis of thoracic spine ([place])  08/16/2015   .  Discitis of thoracic region     .  Septic shock ([place])  08/13/2015   .  Acute respiratory failure with hypoxia ([place])  08/13/2015   .  Cord compression ([place])  08/11/2015   .  Back pain  08/11/2015   .  Thoracic spine pain  08/11/2015   .  Muscle strain of chest wall  07/21/2015   .  Controlled type 2 diabetes mellitus with chronic kidney disease on chronic dialysis ([place])  07/20/2015   .  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  Absolute anemia     .  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  OSA (obstructive sleep apnea)  10/14/2013   .  Tracheostomy status ([place])  09/17/2013   .  Difficult airway for intubation  08/20/2013   .  End stage renal disease on dialysis ([place])  04/30/2012   .  Hypocalcemia  04/30/2012   .  Normocytic anemia  04/30/2012   .  DM II (diabetes mellitus, type II), controlled ([place])  06/20/2007   .  DYSLIPIDEMIA  06/20/2007   .  Morbid obesity ([place])  06/20/2007   .  Essential hypertension  06/20/2007     Expected Discharge Date: Expected Discharge Date: 09/09/15  Team Members Present: Physician leading conference: Dr. Alger Simons Social Worker Present: Lennart Pall, LCSW Nurse Present: Heather Roberts, RN PT Present: Canary Brim, PT OT Present: Napoleon Form, OT SLP Present: Weston Anna, SLP PPS Coordinator present : Daiva Nakayama, RN, CRRN        Current Status/Progress  Goal  Weekly Team Focus   Medical     t4 osteomyelitis with myelopathy. ESRD. having difficulty with pain  improve ability to perform self-care   pain mgt, cognitive rx?, wound care    Bowel/Bladder     Continent of bowel and bladder.   Pt to remain continent of bowel and bladder   Monitor    Swallow/Nutrition/ Hydration               ADL's     Max A+2 functional transfers, toileting, LB dressing; min A UB dressing; set-up grooming  min- mod overall  Sit <> stands in prep for toileting task and functional transfers; activity tolerance   Mobility     max assist to total +2  min to mod assist overall w/c level   transfers, endurance, pain management, endurance, strengthening, sit to stands, balance   Communication               Safety/Cognition/ Behavioral Observations              Pain     Tramadol 50mg  scheduled, Oxy IR 10mg  q 4hrs   <5  Monitor for nonverbal cues of pain    Skin     Honey dressing to T4-T5 lamin, OTA, visible old blood, IJ cath to R IJ  No additional skin breakdown  Monitor and assess q shift    Rehab Goals Patient on target to meet rehab goals: Yes *See Care Plan and progress notes for long and short-term goals.    Barriers to Discharge:  premorbid sedentary living status? pain, cognition?     Possible Resolutions to Barriers:   continued NMR, adaptive equipment training, repeittion, care giver ed     Discharge Planning/Teaching Needs:   Pt reports he will dc home with family and friends providing "any assistance I need." - Still need to confirm  will schedule as caregivers are confirmed    Team Discussion:    MD  notes not as impaired as he would expect neurologically.  Having difficult time with therapies, however, and determining best options for moving, transferring, etc.  Initial eval today and needed as much as 3+ assist.  MD to trial a decreased on pain meds.  Tx hope to aim for min assist w/c level goals but likely to alter as clearer picture of abilities emerges.   Revisions to Treatment Plan:    NA    Continued Need for Acute Rehabilitation Level of Care: The patient requires daily medical management by a physician with specialized training in physical medicine and rehabilitation for the following conditions: Daily direction of a  multidisciplinary physical rehabilitation program to ensure safe treatment while eliciting the highest outcome that is of practical value to the patient.: Yes Daily medical management of patient stability for increased activity during participation in an intensive rehabilitation regime.: Yes Daily analysis of laboratory values and/or radiology reports with any subsequent need for medication adjustment of medical intervention for : Neurological problems;Post surgical problems;Other  Melannie Metzner, [place] 08/18/2015, 4:55 PM

## 2015-08-25 NOTE — Procedures (Signed)
Normally functioning right forearm native AV fistula without peripheral or central stenosis.  No intervention performed.  No immediate post procedural complication.   Ronny Bacon, MD Pager #: [phone]

## 2015-08-25 NOTE — Progress Notes (Signed)
Occupational Therapy Note  Patient Details  Name: Seth [name] MRN: [id number] Date of Birth: 10-13-1965  Today's Date: 08/25/2015 OT Individual Time: 1300-1400 OT Individual Time Calculation (min): 60 min   Pt c/o 4/10 pain in Rt knee (incrased to 8/10 with activity); repositioned Individual Therapy  Pt resting in w/c upon arrival with relative present.  Pt stated he was going down soon for a procedure (confirmed by RN) and didn't want to leave his room.  Pt agreed to therapy in room.  Pt performed sit<>stand X 4 with mod A and attempted to take steps to simulate STS.  Pt was unable to complete a stand pivot transfer.  Pt returned to bed in preparation for transport for procedure.  Pt required more than a reasonable amount of time with multiple rest breaks. Focus on activity tolerance, sit<>stand, standing balance, functional transfers, bed mobility, and safety awareness.   Leotis Shames [facility] 08/25/2015, 2:05 PM

## 2015-08-25 NOTE — Progress Notes (Signed)
Orthopedic Tech Progress Note Patient Details:  Seth [name] Dec 15, 1965 [id number]  [name], male   DOB: 1966-08-03, 49 y.o.   MRN: [id number]   [place] Pink 08/25/2015, 9:32 AMCalled [employer] for neoprene hinged knee brace.

## 2015-08-25 NOTE — Progress Notes (Signed)
Recreational Therapy Session Note  Patient Details  Name: Seth [name] MRN: [id number] Date of Birth: Oct 18, 1965 Today's Date: 08/25/2015  Pain: 7/10 R knee, neck/back pain, pt declined pain meds-education provided on importance of pain control and how it's effects mobility, pt stated understanding Skilled Therapeutic Interventions/Progress Updates: Session focused on activity tolerance, dynamic standing balance, standing tolerance during tabletop card game.  Pt stood x3 with 1UE support to play card game, verbal cuing for upright posture & min-mod assist for sit-stand & min for balance.   Therapy/Group: Co-Treatment   Chayanne Filippi 08/25/2015, 4:36 PM

## 2015-08-26 ENCOUNTER — Inpatient Hospital Stay (HOSPITAL_COMMUNITY): Payer: Medicare Other | Admitting: Physical Therapy

## 2015-08-26 ENCOUNTER — Inpatient Hospital Stay (HOSPITAL_COMMUNITY): Payer: Non-veteran care | Admitting: Occupational Therapy

## 2015-08-26 ENCOUNTER — Encounter: Payer: Medicare Other | Admitting: Internal Medicine

## 2015-08-26 LAB — RENAL FUNCTION PANEL
ALBUMIN: 2.9 g/dL — AB (ref 3.5–5.0)
Anion gap: 14 (ref 5–15)
BUN: 63 mg/dL — AB (ref 6–20)
CALCIUM: 8.5 mg/dL — AB (ref 8.9–10.3)
CO2: 24 mmol/L (ref 22–32)
CREATININE: 9.17 mg/dL — AB (ref 0.61–1.24)
Chloride: 92 mmol/L — ABNORMAL LOW (ref 101–111)
GFR calc Af Amer: 7 mL/min — ABNORMAL LOW (ref >60)
GFR, EST NON AFRICAN AMERICAN: 6 mL/min — AB (ref >60)
GLUCOSE: 186 mg/dL — AB (ref 65–99)
PHOSPHORUS: 4.1 mg/dL (ref 2.5–4.6)
Potassium: 4.6 mmol/L (ref 3.5–5.1)
SODIUM: 130 mmol/L — AB (ref 135–145)

## 2015-08-26 LAB — GLUCOSE, CAPILLARY
GLUCOSE-CAPILLARY: 160 mg/dL — AB (ref 65–99)
GLUCOSE-CAPILLARY: 166 mg/dL — AB (ref 65–99)
GLUCOSE-CAPILLARY: 196 mg/dL — AB (ref 65–99)
Glucose-Capillary: 178 mg/dL — ABNORMAL HIGH (ref 65–99)

## 2015-08-26 LAB — CBC
HEMATOCRIT: 26.3 % — AB (ref 39.0–52.0)
Hemoglobin: 8.8 g/dL — ABNORMAL LOW (ref 13.0–17.0)
MCH: 31.4 pg (ref 26.0–34.0)
MCHC: 33.5 g/dL (ref 30.0–36.0)
MCV: 93.9 fL (ref 78.0–100.0)
PLATELETS: 422 10*3/uL — AB (ref 150–400)
RBC: 2.8 MIL/uL — ABNORMAL LOW (ref 4.22–5.81)
RDW: 15.2 % (ref 11.5–15.5)
WBC: 14.6 10*3/uL — AB (ref 4.0–10.5)

## 2015-08-26 MED ORDER — PENTAFLUOROPROP-TETRAFLUOROETH EX AERO: 1.0000 "application " | INHALATION_SPRAY | CUTANEOUS | Status: DC | PRN

## 2015-08-26 MED ORDER — SODIUM CHLORIDE 0.9 % IV SOLN: 100.0000 mL | INTRAVENOUS | Status: DC | PRN

## 2015-08-26 MED ORDER — GLIMEPIRIDE 2 MG PO TABS
1.0000 mg | ORAL_TABLET | Freq: Every day | ORAL | Status: DC
Start: 2015-08-27 — End: 2015-09-07
  Administered 2015-08-27 – 2015-09-07 (×12): 1 mg via ORAL
  Filled 2015-08-26 (×12): qty 1

## 2015-08-26 MED ORDER — HEPARIN SODIUM (PORCINE) 1000 UNIT/ML DIALYSIS: 1000.0000 [IU] | INTRAMUSCULAR | Status: DC | PRN

## 2015-08-26 MED ORDER — ALTEPLASE 2 MG IJ SOLR
2.0000 mg | Freq: Once | INTRAMUSCULAR | Status: DC | PRN
  Filled 2015-08-26: qty 2

## 2015-08-26 MED ORDER — LIDOCAINE-PRILOCAINE 2.5-2.5 % EX CREA
1.0000 "application " | TOPICAL_CREAM | CUTANEOUS | Status: DC | PRN
  Filled 2015-08-26: qty 5

## 2015-08-26 MED ORDER — LIDOCAINE HCL (PF) 1 % IJ SOLN: 5.0000 mL | INTRAMUSCULAR | Status: DC | PRN

## 2015-08-26 NOTE — Progress Notes (Signed)
Recreational Therapy Session Note  Patient Details  Name: Seth [name] MRN: [id number] Date of Birth: [birth year] Today's Date: 08/26/2015  Pain: 10/10, premedicated, ice packs applied to both knees prior to session and reapplied at end of session, RN made aware of c/o pain  Session:  Pt propelled w/c to & from gym using BUE's with supervision and extra time.  Pt stood in [facility] lift x4 with +2 assist for bilateral knee support.  When standing focused on standing tolerance, midline stance, upright posture and minimally dynamic standing balance lifting 1 UE off of the walker.  Pt motivated and willing to participate in spite of knee pain.    Therapy/Group: Co-Treatment Dollye Glasser 08/26/2015, 12:27 PM

## 2015-08-26 NOTE — Procedures (Signed)
I have seen and examined this patient and agree with the plan of care . Patient seen on dialysis with no complaints  Removed 3 L  BP stable during dialysis treatment  AVF buttonhole technique no issues  Fistulogram 11/15 no issues with access  Jeyli Zwicker,[place] 08/26/2015, 6:19 PM

## 2015-08-26 NOTE — Progress Notes (Signed)
Physical Therapy Session Note  Patient Details  Name: Seth [name] MRN: [id number] Date of Birth: Nov 01, 1965  Today's Date: 08/26/2015 PT Individual Time: 4854-6270 PT Individual Time Calculation (min): 30 min   Short Term Goals: Week 1:  PT Short Term Goal 1 (Week 1): Pt will be able to perform transfer with 1 person assist with LRAD PT Short Term Goal 1 - Progress (Week 1): Met PT Short Term Goal 2 (Week 1): Pt will be able to initiate w/c propulsion PT Short Term Goal 2 - Progress (Week 1): Met PT Short Term Goal 3 (Week 1): Pt will be able to perform sit to stand with +2 assist without use of lift equipment PT Short Term Goal 3 - Progress (Week 1): Met  Skilled Therapeutic Interventions/Progress Updates:   Patient sitting in wheelchair, reporting 10/10 R > L knee pain and swelling and requesting to defer physical therapy today as he "needs a day to rest." Patient reported he did take pain medication approx 1 hour prior to therapy and therapist reinforced need to utilize pain medication as adjunct to be able to fully participate in therapy and continue to make progress with functional mobility. Patient verbalized understanding and in agreement. Patient propelled wheelchair using BUE 2 x 270 ft to and from ortho gym to obtain cold packs and therapist positioned on B knees with towels/ace wrap, patient instructed to remove ice packs after 20 minutes and educated on 20 min on, 20 min off wearing schedule. Patient verbalized agreement to participate fully in next therapy session at 11 am.   Therapy Documentation Precautions:  Precautions Precautions: Fall, Back Precaution Comments: reviewed/ demonstrated back precautions, however, pt with 0 carry over of education and unable to recall pre-cautions after 5 minutes Restrictions Weight Bearing Restrictions: No General: PT Amount of Missed Time (min): 30 Minutes PT Missed Treatment Reason: Pain (R > L knee pain) Pain: Pain  Assessment Pain Assessment: 0-10 Pain Score: 10-Worst pain ever Pain Type: Acute pain Pain Location: Knee Pain Orientation: Right Pain Descriptors / Indicators: Aching;Throbbing Pain Onset: On-going Pain Intervention(s): Cold applied;Emotional support (premedicated)   See Function Navigator for Current Functional Status.   Therapy/Group: Individual Therapy  Laretta Alstrom 08/26/2015, 9:50 AM

## 2015-08-26 NOTE — Progress Notes (Signed)
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 ID saw- plan Cefazolin for 6 weeks with HD.  WBC 13.8 K, Afeb.   Needs TTE to be sure no notable vegetation. Echo done 11/6- poor study d/t habitus, pt moving. No vegetation noted on results  9/24: BCx: Staph lugdunensis  11/1 blood: neg 11/1: op cx: neg  Ancef 11/2 >>  (09/24/15)  Plan -Ancef 2 gms post HD on days dialysis received - f/u HD tolerance

## 2015-08-26 NOTE — Progress Notes (Signed)
Social Work [name], male   DOB: 1966/03/16, 49 y.o.   MRN: [id number]   Met with pt this morning to review team conference.  Aware and agreeable with continuing to aim toward 11/30 d/c date.  He feels he has begun making good progress and "surprising myself.Marland KitchenMarland KitchenThanks to all these ladies here.  I cannot say enough about how blessed I feel to be here."  (tearful).  Pt reports that his sister, brother and children are ready to provide any needed assistance.  Will continue to follow for support and d/c planning needs.  Azriella Mattia, LCSW

## 2015-08-26 NOTE — Progress Notes (Signed)
[facility]     PROGRESS NOTE    Subjective/Complaints: Pt a little frustrated regarding issues with his AVF. Otherwise making progress. Feels that his strength and confidence are improving. Sugars remain elevated  ROS: Pt denies fever, rash/itching, headache, blurred or double vision, nausea, vomiting, abdominal pain, diarrhea, chest pain, shortness of breath, palpitations, dysuria, dizziness, bleeding, anxiety, or depression   Objective: Vital Signs: Blood pressure 164/76, pulse 91, temperature 98 F (36.7 C), temperature source Oral, resp. rate 18, weight 137.2 kg (302 lb 7.5 oz), SpO2 100 %. Ir Shuntogram/ Fistulagram Right Mod Sed  08/25/2015  INDICATION: Decreased flows at dialysis. EXAM: DIALYSIS AV SHUNTOGRAM/FISTULAGRAM COMPARISON:  Right forearm native AV fistulalysis - 08/27/2013 MEDICATIONS: None. CONTRAST:  12mL OMNIPAQUE IOHEXOL 300 MG/ML  SOLN ANESTHESIA/SEDATION: None FLUOROSCOPY TIME:  1 minute (56 mGy) COMPLICATIONS: None immediate PROCEDURE: The skin overlying the right forearm native AV fistula was prepped with Betadine in a sterile fashion, and a sterile drape was applied covering the operative field. A diagnostic shunt study was performed via an 18 gauge angiocatheter introduced into venous outflow. Venous drainage was assessed to the level of the central veins in the chest. Proximal shunt was studied by reflux maneuver with temporary compression of venous outflow. The angiocath was removed and hemostasis was achieved with manual compression. A dressing was placed. The patient tolerated the procedure well without immediate post procedural complication. FINDINGS: The right forearm native AV fistula is widely patent. There is minimal competitive flow from the main draining vein at the level of the forearm though there is preferential flow through the deep venous system of the right upper extremity at the level of the upper arm. The main draining  venous limb is widely patent throughout its imaged course without evidence of a hemodynamically significant stenosis. The central venous system of the right upper extremity is widely patent to the level of the right atrium. Reflux fistulagram demonstrates wide patency of the arterial limb and anastomosis. IMPRESSION: IMPRESSION Normally functioning right forearm native AV fistula without peripheral or central stenosis. ACCESS: This access remains amenable to future percutaneous interventions as clinically indicated. Electronically Signed   By: Sandi Mariscal M.D.   On: 08/25/2015 16:11    Recent Labs  08/24/15 1600 08/25/15 1730  WBC 16.0* 13.8*  HGB 8.9* 8.1*  HCT 26.4* 25.0*  PLT 458* 434*    Recent Labs  08/24/15 1600 08/25/15 1730  NA 129* 128*  K 4.9 5.2*  CL 90* 90*  GLUCOSE 144* 198*  BUN 88* 108*  CREATININE 11.28* 13.04*  CALCIUM 8.8* 8.6*   CBG (last 3)   Recent Labs  08/25/15 2144 08/26/15 0650 08/26/15 1113  GLUCAP 201* 160* 178*    Wt Readings from Last 3 Encounters:  08/25/15 137.2 kg (302 lb 7.5 oz)  08/17/15 136.8 kg (301 lb 9.4 oz)  08/10/15 139.254 kg (307 lb)    Physical Exam:  Constitutional: He is oriented to person, place, and time. He appears well-developed and well-nourished.  Morbidly obese male lying in bed. NAD.  HENT:  Head: Normocephalic and atraumatic.  Eyes: Conjunctivae and EOM are normal. Pupils are equal, round, and reactive to light.  Neck: Normal range of motion. Neck supple.   Cardiovascular: Normal rate and regular rhythm.  Respiratory: Effort normal and breath sounds normal. No respiratory distress. He has no wheezes.  GI: Soft. Bowel sounds are normal. He exhibits no distension. There is no tenderness.   Musculoskeletal: right knee with  pain during PROM.  Neuro: Strength B/l UE 4+/5 throughout LLE hip flexion 4/5, ankle dorsi/plantar flexion 4+/5 RLE: hip flexion 4-/5, ankle dorsi/plantar flexion 4+/5  He is alert and  oriented to person, place, and time.  Speech clear.  Able to follow commands without difficulty.  He is alert and oriented to person, place, and time.  He has normal reflexes.  Intentional tremors BUE. Sensation intact to light touchin all 4 limbs  Skin: Skin is warm and dry. Surgical site with honeycomb dressing, drain with minimal drainage.   BLE are improving. Dry flaky skin still present feet and thighs.  Psychiatric: He has a normal mood and affect. His speech is normal. Thought content normal. Cognition and memory are normal.     Assessment/Plan: 1. Functional deficits secondary to T4 osteomyelitis with cord compression/myelopathy which require 3+ hours per day of interdisciplinary therapy in a comprehensive inpatient rehab setting. Physiatrist is providing close team supervision and 24 hour management of active medical problems listed below. Physiatrist and rehab team continue to assess barriers to discharge/monitor patient progress toward functional and medical goals.  Function:  Bathing Bathing position   Position: Shower  Bathing parts Body parts bathed by patient: Right arm, Left arm, Chest, Abdomen, Front perineal area, Right upper leg, Left upper leg, Right lower leg, Left lower leg, Back Body parts bathed by helper: Buttocks  Bathing assist Assist Level: Touching or steadying assistance(Pt > 75%)      Upper Body Dressing/Undressing Upper body dressing   What is the patient wearing?: Pull over shirt/dress     Pull over shirt/dress - Perfomed by patient: Thread/unthread right sleeve, Thread/unthread left sleeve, Put head through opening, Pull shirt over trunk Pull over shirt/dress - Perfomed by helper: Pull shirt over trunk        Upper body assist Assist Level: Supervision or verbal cues      Lower Body Dressing/Undressing Lower body dressing   What is the patient wearing?: Shoes (Refused remainder LB dressing) Underwear - Performed by patient:  Thread/unthread right underwear leg, Thread/unthread left underwear leg Underwear - Performed by helper: Pull underwear up/down Pants- Performed by patient: Thread/unthread left pants leg Pants- Performed by helper: Thread/unthread right pants leg, Pull pants up/down Non-skid slipper socks- Performed by patient: Don/doff right sock, Don/doff left sock Non-skid slipper socks- Performed by helper: Don/doff right sock, Don/doff left sock     Shoes - Performed by patient: Don/doff left shoe, Don/doff right shoe Shoes - Performed by helper: Fasten right, Fasten left, Don/doff right shoe, Don/doff left shoe          Lower body assist Assist for lower body dressing: Touching or steadying assistance (Pt > 75%)      Toileting Toileting Toileting activity did not occur: No continent bowel/bladder event (no bowel, urinal for urine) Toileting steps completed by patient: Performs perineal hygiene Toileting steps completed by helper: Adjust clothing prior to toileting, Performs perineal hygiene, Adjust clothing after toileting Toileting Assistive Devices: Grab bar or rail  Toileting assist Assist level: Two helpers   Transfers Chair/bed transfer   Chair/bed transfer method: Lateral scoot, Stand pivot Chair/bed transfer assist level: Supervision or verbal cues Chair/bed transfer assistive device: Armrests, Medical sales representative Ambulation activity did not occur: Safety/medical concerns   Max distance: 8 Assist level: 2 helpers   Wheelchair   Type: Manual Max wheelchair distance: 200 Assist Level: Supervision or verbal cues  Cognition Comprehension Comprehension assist level: Understands complex 90% of the time/cues  10% of the time  Expression Expression assist level: Expresses complex 90% of the time/cues < 10% of the time  Social Interaction Social Interaction assist level: Interacts appropriately 90% of the time - Needs monitoring or encouragement for participation or  interaction.  Problem Solving Problem solving assist level: Solves basic 75 - 89% of the time/requires cueing 10 - 24% of the time  Memory Memory assist level: Recognizes or recalls 75 - 89% of the time/requires cueing 10 - 24% of the time   Medical Problem List and Plan: 1. 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 DVT Prophylaxis/Anticoagulation: Pharmaceutical: Lovenox to continue 3. Pain Management: Continue oxy IR prn. Continue 12.mcg fentanyl patch, right knee sleeve ordered---using ice effectively also  -activity tolerance improving  -hinged neoprene knee brace RLE--hanger to deliver today 4. Mood: LCSW to follow for evaluation and support.  5. Neuropsych: This patient is capable of making decisions on his own behalf. 6. Skin/Wound Care: continuelocal care  7. Fluids/Electrolytes/Nutrition: Monitor I/O. ON renal diet with 1200 cc/FR.   -labs with HD 8. ESRD: On HD MWF. Schedule sessions later in afternoons to help with therapy tolerance.   -shuntogram revealed normal flow yesterday 9. HTN: Monitor BID. Continue Norvasc.   -pressures improve post HD 10. Anemia of chronic disease:following up with HD (8.1) 11. DM type 2:   use SSI for elevated BS.   -add amaryl for hyperglemica---1mg  daily to start 12. OSA: Continue use of CPAP.  13. Constipation: trying to stay ahead  -continue lax/soft  -prn sorbitol   LOS (Days) 9 A FACE TO FACE EVALUATION WAS PERFORMED  SWARTZ,ZACHARY T 08/26/2015 12:45 PM

## 2015-08-26 NOTE — Progress Notes (Signed)
Occupational Therapy Weekly Progress Note  Patient Details  Name: Seth [name] MRN: [id number] Date of Birth: 05-16-1966  Beginning of progress report period: August 18, 2015 End of progress report period: August 26, 2015  Today's Date: 08/26/2015 OT Individual Time: 2694-8546 and 1300-1355  OT Individual Time Calculation (min): 60 min and 55 min   Patient has met 3 of 3 short term goals.    Patient continues to demonstrate the following deficits: abnormal posture, acute pain, cognitive deficits and pain in thoracic spine and therefore will continue to benefit from skilled OT intervention to enhance overall performance with BADL and Reduce care partner burden.  Patient progressing toward long term goals..  Continue plan of care.  OT Short Term Goals Week 1:  OT Short Term Goal 1 (Week 1): Pt will complete toileting task with max A +1 OT Short Term Goal 1 - Progress (Week 1): Met OT Short Term Goal 2 (Week 1): Pt will demonstrate carry over of education of spinal pre-cautions with min VCs OT Short Term Goal 2 - Progress (Week 1): Met OT Short Term Goal 3 (Week 1): Pt will complete sit <> stand at sink with max A +1 OT Short Term Goal 3 - Progress (Week 1): Met OT Short Term Goal 4 (Week 1): Pt will transfer to [place] with +1 assist OT Short Term Goal 4 - Progress (Week 1): Updated due to goal met (Pt no longer requiring mechanic lift for transfers) Week 2:  OT Short Term Goal 1 (Week 2): Pt willl complete stand pivot transfer to toilet with min A OT Short Term Goal 2 (Week 2): Pt will don pants sit <> stand with min A OT Short Term Goal 3 (Week 2): Pt will complete toileting task with max A  Skilled Therapeutic Interventions/Progress Updates:    Pt seen for OT session focusing on functional mobility and AE training. Pt sitting EOB upon arrival eating breakfast. Pt voicing 10/10 pain in R knee, RN made aware. Pt requesting using sliding board for transfer to w/c,  declining to stand due to knee pain- transfer completed with assist to steady equipment. Pt completed grooming task seated in w/c at sink mod I. Pt introduced to elastic shoelaces, and shoethreaded into shoes. He cont to require assist donning shoes due to decreased ability to reach feet and R knee pain. Reviewed spinal precautions during functional tasks.  He self propelled w/c throughout unit for UE strengthening and activity tolerance. Pt navigated over various surfaces to replicate home w/c use. VCs provided for w/c management and propulsion technique. Pt left sitting in w/c at end of session, all needs in reach. Pt provided with home measurement sheet for family to fill out in prep for planned d/c home.   Session Two: Pt seen for OT therapy session focusing on functional activity tolerance and LE stretching/ strengthening. Pt in w/c upon arrival, agreeable to tx session. Knee brace had just been delivered. Education and demonstration provided for proper donning technique. Pt unable to get proper fit due to R knee edema, unable to fasten top brace in proper position.  Educated pt regarding use of ice to assist with edema management.  Pt self propelled w/c to therapy gym for UE strengthening and endurance, able to complete in more timely manner compared to previous sessions. Attempted sit <> stand from w/c, however, due to knee pain, was unable to come into full erect standing position, and declined all further attempts of standing. HE transferred  to therapy mat and AAROM/ passive stretching applied to B hamstrings, as pt voiced increased "stiffness" in muscle group. Also provided pt with theraband and educated on self stretching techniques from w/c level. Pt returned to room at end of session, left sitting in w/c with family members present.   Therapy Documentation Precautions:  Precautions Precautions: Fall, Back Precaution Comments: reviewed/ demonstrated back precautions, however, pt with 0 carry  over of education and unable to recall pre-cautions after 5 minutes Restrictions Weight Bearing Restrictions: No Pain: Pain Assessment Pain Score: 6 Pain Type: Chronic pain Pain Location: Knee Pain Orientation: Right;Left Pain Descriptors / Indicators: Throbbing;Sore;Tightness Pain Onset: On-going Pain Intervention(s): Repositioned;Other (Comment) (stretching)  See Function Navigator for Current Functional Status.   Therapy/Group: Individual Therapy  Lewis, Amy C 08/26/2015, 7:08 AM

## 2015-08-26 NOTE — Progress Notes (Signed)
Physical Therapy Weekly Progress Note  Patient Details  Name: Seth [name] MRN: [id number] Date of Birth: 1966-04-09  Beginning of progress report period: August 18, 2015 End of progress report period: August 26, 2015  Today's Date: 08/26/2015 PT Individual Time: 1105-1200 PT Individual Time Calculation (min): 55 min   Patient has made good progress and has met 3 of 3 short term goals.  Pt is currently supervision for w/c mobility in controlled environment and requires fluctuating levels of assistance for basic transfers(supervision for lateral scooting and mod-max A for stand pivot), gait with RW and +2 A for stair negotiation.  Pt continues to be limited by pain and knee instability.  Pt continues to be educated on appropriate pain management and a knee support brace has been ordered by the MD.    Patient continues to demonstrate the following deficits: pain, impaired strength, motor control, impaired trunk/postural control, impaired balance, gait, decreased activity tolerance/endurance and therefore will continue to benefit from skilled PT intervention to enhance overall performance with activity tolerance, balance, postural control, functional use of  right lower extremity and left lower extremity, coordination and knowledge of precautions.  Patient progressing toward long term goals..  Plan of care revisions: Goals upgraded due to progress.  PT Short Term Goals Week 1:  PT Short Term Goal 1 (Week 1): Pt will be able to perform transfer with 1 person assist with LRAD PT Short Term Goal 1 - Progress (Week 1): Met PT Short Term Goal 2 (Week 1): Pt will be able to initiate w/c propulsion PT Short Term Goal 2 - Progress (Week 1): Met PT Short Term Goal 3 (Week 1): Pt will be able to perform sit to stand with +2 assist without use of lift equipment PT Short Term Goal 3 - Progress (Week 1): Met Week 2:  PT Short Term Goal 1 (Week 2): Pt will perform dynamic standing balance activities  with consistent min A and improved knee control with brace PT Short Term Goal 2 (Week 2): Pt will perform basic and car transfers with consistent min A  PT Short Term Goal 3 (Week 2): Pt will perform w/c mobility x 150' in controlled and simulated home environment 150' Mod I PT Short Term Goal 4 (Week 2): Pt will perform ambulation x 50' in controlled environment with LRAD and min A of one person PT Short Term Goal 5 (Week 2): Pt will negotiate 4 stairs with L rail with mod A of one person  Skilled Therapeutic Interventions/Progress Updates:   Pt received in w/c with ice packs applied to bilateral knees.  Pt continues to report 10/10 pain in bilateral knees despite taking pain medication this am and use of ice packs.  Discussed with pt use of Maxi Sky lift to provide body weight supported standing and gait training.  Pt agreeable to therapy.  Pt performed w/c mobility x 150' to gym with supervision and extra time.  Pt performed set up of w/c and transfer w/c > mat lateral scooting with supervision.  Pt set up in [employer] walking sling for body weight support.  Pt performed sit > stand from elevated mat x 4 reps with max A-+2 A for support at bilat knees.  In standing performed trunk/postural control training, weight shifting and maintaining upright trunk while unweighting UE and performing dynamic UE movements.  Pt with one episode of R knee buckling.  During standing rest breaks pt engaged in bilat LE open chain LAQ and hamstring stretches.  Performed  stand pivot mat > w/c with Maxi Sky and RW and +2 for safety with no episodes of knee buckling.  Returned to room in w/c with supervision.  Pt left with all items within reach and orthotist present to deliver hinged knee brace.       Therapy Documentation Precautions:  Precautions Precautions: Fall, Back Precaution Comments: reviewed/ demonstrated back precautions, however, pt with 0 carry over of education and unable to recall pre-cautions after 5  minutes Restrictions Weight Bearing Restrictions: No General: PT Amount of Missed Time (min): 30 Minutes PT Missed Treatment Reason: Pain (R > L knee pain) Pain: Pain Assessment Pain Assessment: 0-10 Pain Score: 10-Worst pain ever Pain Type: Chronic pain Pain Location: Knee Pain Orientation: Right;Left Pain Descriptors / Indicators: Throbbing;Sore;Tightness Pain Frequency: Constant Pain Onset: On-going Patients Stated Pain Goal: 4 Pain Intervention(s): Cold applied;Other (Comment) (stretching and premedicated)  See Function Navigator for Current Functional Status.  Therapy/Group: Co-Treatment with recreation therapist  Raylene Everts [facility] 08/26/2015, 12:15 PM

## 2015-08-27 ENCOUNTER — Inpatient Hospital Stay (HOSPITAL_COMMUNITY): Payer: Non-veteran care | Admitting: Occupational Therapy

## 2015-08-27 ENCOUNTER — Inpatient Hospital Stay (HOSPITAL_COMMUNITY): Payer: Medicare Other

## 2015-08-27 DIAGNOSIS — M1711 Unilateral primary osteoarthritis, right knee: Secondary | ICD-10-CM

## 2015-08-27 LAB — GLUCOSE, CAPILLARY
GLUCOSE-CAPILLARY: 166 mg/dL — AB (ref 65–99)
GLUCOSE-CAPILLARY: 131 mg/dL — AB (ref 65–99)
GLUCOSE-CAPILLARY: 95 mg/dL (ref 65–99)
GLUCOSE-CAPILLARY: 152 mg/dL — AB (ref 65–99)

## 2015-08-27 NOTE — Progress Notes (Signed)
Occupational Therapy Session Note  Patient Details  Name: Seth [name] MRN: [id number] Date of Birth: 1966-07-24  Today's Date: 08/27/2015 OT Individual Time: 1000-1100 and 1300-1400 OT Individual Time Calculation (min): 60 min and 60 min   Short Term Goals: Week 2:  OT Short Term Goal 1 (Week 2): Pt willl complete stand pivot transfer to toilet with min A OT Short Term Goal 2 (Week 2): Pt will don pants sit <> stand with min A OT Short Term Goal 3 (Week 2): Pt will complete toileting task with max A  Skilled Therapeutic Interventions/Progress Updates:    Session One: Pt seen for OT session, focusing on sit <> stands in prep for functional tasks. Pt sitting EOB upon arrival, voicing he had already completed bathing/ dressing at sink. He reported he stood at sink independnently to complete hygiene. Educated regarding fall risk and need for assist with standing tasks. He completed stand pivot transfer using RW to w/c. He completed sit <> stands to RW with min A. Focused on standing without UE support. He stood with steadying assist ~1 minute each trial. HE required increased seated rest breaks following each standing trial due to decreased functional activity tolerance. He stood at sink to complete oral care with steadying assist while using B UEs during oral care task.  He completed LB dressing, standing at sink with supervision to pull pants up. Increased time required to don B shoes.  He was left sitting in w/c at end of session, all needs in reach. Pt educated regarding home layout, energy conservation, and d/c planning.   Session Two: Pt seen for OT therapy session focusing on functional transfers. Pt in bathroom upon arrival, attempting to stand up to complete hygiene without assistance. OT assist pt in return to w/c with assist required due to decreased balance. Educated pt regarding need to ask for assistance during standing tasks as he is hihg fall risk. Pt voiced understanding,  however, insistent he wants to "test" himself and his abilities. Cont to provide education and strong recommendation for assist for standing tasks. He self propelled w/c to ADL apartment for UE strengthening. He completed simulated tub transfer bench transfer standing at RW with min- mod A with VCs for sequencing.  In therapy gym, pt completed sit <> stands at RW to complete functional task using UE with emphasis on functional standing balance and endurance. He tolerated ~2 minutes of static standing with steadying assist. Increased assist needed for controlled sit due to decreased decentric control. Pt verbalized increased comfort and confidence in R knee with addition of brace. Pt left sitting in w/c at end of session in therapy gym with hand off to PT. Pt required increased encouragement and explanation for functional tasks this session. He has decreased awareness of deficits, not understanding why he needed to practice tub transfer bench transfer as he stated " I did it just fine at home, why do I need to practice here". Educated regarding current deficits and current need for assist as well as OT goals and POC.  Pt initiate rest break to " clear his mind" during shower transfer. Following break, pt willingly participated in all aspects of tx session.   Therapy Documentation Precautions:  Precautions Precautions: Fall, Back Precaution Comments: reviewed/ demonstrated back precautions, however, pt with 0 carry over of education and unable to recall pre-cautions after 5 minutes Restrictions Weight Bearing Restrictions: No Pain: Pain Assessment Pain Score: 6   See Function Navigator for Current Functional Status.  Therapy/Group: Individual Therapy  Lewis, Christi Wirick C 08/27/2015, 7:16 AM

## 2015-08-27 NOTE — Progress Notes (Signed)
RT filled CPAP with sterile water. Patient stated he would put the CPAP on when he is ready to sleep.

## 2015-08-27 NOTE — Progress Notes (Signed)
[facility] ROUNDING NOTE   Subjective:   Interval History: no complaints  Objective:  Vital signs in last 24 hours:  Temp:  [98.2 F (36.8 C)-98.5 F (36.9 C)] 98.3 F (36.8 C) (11/17 0555) Pulse Rate:  [62-90] 90 (11/17 0555) Resp:  [18-20] 18 (11/17 0555) BP: (121-160)/(51-100) 151/74 mmHg (11/17 0555) SpO2:  [98 %-100 %] 100 % (11/17 0555) Weight:  [133.5 kg (294 lb 5 oz)] 133.5 kg (294 lb 5 oz) (11/17 0555)  Weight change: -6.4 kg (-14 lb 1.8 oz) Filed Weights   08/25/15 1700 08/25/15 2110 08/27/15 0555  Weight: 139.9 kg (308 lb 6.8 oz) 137.2 kg (302 lb 7.5 oz) 133.5 kg (294 lb 5 oz)    Intake/Output: I/O last 3 completed shifts: In: 480 [P.O.:480] Out: 6826 [Other:6826]   Intake/Output this shift:  Total I/O In: 120 [P.O.:120] Out: -   Obese male  CVS- RRR RS- CTA ABD- BS present soft non-distended EXT- no edema AVF thrill and bruit    Basic Metabolic Panel:  Recent Labs Lab 08/21/15 1645 08/24/15 1600 08/25/15 1730 08/26/15 1450  NA 130* 129* 128* 130*  K 4.2 4.9 5.2* 4.6  CL 93* 90* 90* 92*  CO2 23 20* 20* 24  GLUCOSE 141* 144* 198* 186*  BUN 72* 88* 108* 63*  CREATININE 9.82* 11.28* 13.04* 9.17*  CALCIUM 9.2 8.8* 8.6* 8.5*  PHOS 3.9 4.4 5.3* 4.1    Liver Function Tests:  Recent Labs Lab 08/21/15 1645 08/24/15 1600 08/25/15 1730 08/26/15 1450  ALBUMIN 2.8* 3.0* 2.8* 2.9*   No results for input(s): LIPASE, AMYLASE in the last 168 hours. No results for input(s): AMMONIA in the last 168 hours.  CBC:  Recent Labs Lab 08/21/15 1645 08/24/15 1600 08/25/15 1730 08/26/15 1450  WBC 16.2* 16.0* 13.8* 14.6*  HGB 8.5* 8.9* 8.1* 8.8*  HCT 26.3* 26.4* 25.0* 26.3*  MCV 93.3 91.7 91.9 93.9  PLT 439* 458* 434* 422*    Cardiac Enzymes: No results for input(s): CKTOTAL, CKMB, CKMBINDEX, TROPONINI in the last 168 hours.  BNP: Invalid input(s): POCBNP  CBG:  Recent Labs Lab 08/26/15 0650 08/26/15 1113  08/26/15 1946 08/26/15 2207 08/27/15 0645  GLUCAP 160* 178* 166* 196* 48*    Microbiology: Results for orders placed or performed during the hospital encounter of 08/10/15  Blood culture (routine x 2)     Status: None   Collection Time: 08/11/15  2:45 AM  Result Value Ref Range Status   Specimen Description BLOOD LEFT ARM  Final   Special Requests BOTTLES DRAWN AEROBIC AND ANAEROBIC 5CC  Final   Culture NO GROWTH 5 DAYS  Final   Report Status 08/16/2015 FINAL  Final  Blood culture (routine x 2)     Status: None   Collection Time: 08/11/15  8:27 AM  Result Value Ref Range Status   Specimen Description BLOOD LEFT HAND  Final   Special Requests BOTTLES DRAWN AEROBIC ONLY  5CC  Final   Culture NO GROWTH 5 DAYS  Final   Report Status 08/16/2015 FINAL  Final  Culture, routine-abscess     Status: None   Collection Time: 08/12/15  8:59 PM  Result Value Ref Range Status   Specimen Description ABSCESS  Final   Special Requests THORACIC SPINE  Final   Gram Stain   Final    NO WBC SEEN NO SQUAMOUS EPITHELIAL CELLS SEEN NO ORGANISMS SEEN Performed at [facility]    Culture   Final    NO  GROWTH 3 DAYS Performed at [facility]    Report Status 08/16/2015 FINAL  Final  MRSA PCR Screening     Status: None   Collection Time: 08/13/15 12:50 AM  Result Value Ref Range Status   MRSA by PCR NEGATIVE NEGATIVE Final    Comment:        The GeneXpert MRSA Assay (FDA approved for NASAL specimens only), is one component of a comprehensive MRSA colonization surveillance program. It is not intended to diagnose MRSA infection nor to guide or monitor treatment for MRSA infections.     Coagulation Studies: No results for input(s): LABPROT, INR in the last 72 hours.  Urinalysis: No results for input(s): COLORURINE, LABSPEC, PHURINE, GLUCOSEU, HGBUR, BILIRUBINUR, KETONESUR, PROTEINUR, UROBILINOGEN, NITRITE, LEUKOCYTESUR in the last 72 hours.  Invalid input(s):  APPERANCEUR    Imaging: Ir Shuntogram/ Fistulagram Right Mod Sed  08/25/2015  INDICATION: Decreased flows at dialysis. EXAM: DIALYSIS AV SHUNTOGRAM/FISTULAGRAM COMPARISON:  Right forearm native AV fistulalysis - 08/27/2013 MEDICATIONS: None. CONTRAST:  50mL OMNIPAQUE IOHEXOL 300 MG/ML  SOLN ANESTHESIA/SEDATION: None FLUOROSCOPY TIME:  1 minute (56 mGy) COMPLICATIONS: None immediate PROCEDURE: The skin overlying the right forearm native AV fistula was prepped with Betadine in a sterile fashion, and a sterile drape was applied covering the operative field. A diagnostic shunt study was performed via an 18 gauge angiocatheter introduced into venous outflow. Venous drainage was assessed to the level of the central veins in the chest. Proximal shunt was studied by reflux maneuver with temporary compression of venous outflow. The angiocath was removed and hemostasis was achieved with manual compression. A dressing was placed. The patient tolerated the procedure well without immediate post procedural complication. FINDINGS: The right forearm native AV fistula is widely patent. There is minimal competitive flow from the main draining vein at the level of the forearm though there is preferential flow through the deep venous system of the right upper extremity at the level of the upper arm. The main draining venous limb is widely patent throughout its imaged course without evidence of a hemodynamically significant stenosis. The central venous system of the right upper extremity is widely patent to the level of the right atrium. Reflux fistulagram demonstrates wide patency of the arterial limb and anastomosis. IMPRESSION: IMPRESSION Normally functioning right forearm native AV fistula without peripheral or central stenosis. ACCESS: This access remains amenable to future percutaneous interventions as clinically indicated. Electronically Signed   By: Sandi Mariscal M.D.   On: 08/25/2015 16:11     Medications:     .  amLODipine  10 mg Oral Daily  . antiseptic oral rinse  7 mL Mouth Rinse QID  . aspirin  81 mg Oral Daily  . calcitRIOL  0.5 mcg Oral Q M,W,F  . calcium acetate  2,001 mg Oral TID WC  .  ceFAZolin (ANCEF) IV  2 g Intravenous Q M,W,F-HD  . chlorhexidine gluconate  15 mL Mouth Rinse BID  . cholecalciferol  1,000 Units Oral Daily  . cinacalcet  30 mg Oral Q breakfast  . darbepoetin (ARANESP) injection - DIALYSIS  100 mcg Intravenous Q Mon-HD  . enoxaparin (LOVENOX) injection  30 mg Subcutaneous Q24H  . fentaNYL  12.5 mcg Transdermal Q72H  . glimepiride  1 mg Oral Q breakfast  . hydrocerin   Topical BID  . insulin aspart  0-15 Units Subcutaneous TID WC  . insulin aspart  0-5 Units Subcutaneous QHS  . pantoprazole  40 mg Oral QHS  . 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 ESRD:  1. MWF [place] via AVF Dialysis tomorrow 2. AVF  2. Holding heparin currently Post operatively 3. Pathogical Fracture at T4 and T5 1. S/p laminectomy 11/2 2. 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 On cefazolin, recent Stah Lugdunesis bacteremia- abx will need to be continued at least 6 weeks- anticipated end date around 12/15 4. Only on HD for 2y, seems unlikely for renal osteodystrophy 4. HTN/Vol: Aim for EDW lower than previous, BP stable- requiring huge amounts of UF per tx- 5. Anemia: Hb improved still in the 8.8  range added aranesp - transfuse if needed 6. MBD: cont PhosLo, calcitriol and cinacaclet,  calcium and phosphorus controlled 1. Last PTH 407 2. Cont binder- increased phoslo to 3 with meals- is better  Will plan dialysis in AM    LOS:  10 Kekai Geter,[place] W @TODAY @10 :53 AM

## 2015-08-27 NOTE — Plan of Care (Signed)
Problem: RH PAIN MANAGEMENT Goal: RH STG PAIN MANAGED AT OR BELOW PT'S PAIN GOAL Less than 3  Outcome: Not Progressing Reports pain as 10

## 2015-08-27 NOTE — Progress Notes (Signed)
Physical Therapy Session Note  Patient Details  Name: Seth [name] MRN: [id number] Date of Birth: 1966/09/07  Today's Date: 08/27/2015 PT Individual Time: [id number] PT Individual Time Calculation (min): 55 min   Short Term Goals: Week 2:  PT Short Term Goal 1 (Week 2): Pt will perform dynamic standing balance activities with consistent min A and improved knee control with brace PT Short Term Goal 2 (Week 2): Pt will perform basic and car transfers with consistent min A  PT Short Term Goal 3 (Week 2): Pt will perform w/c mobility x 150' in controlled and simulated home environment 150' Mod I PT Short Term Goal 4 (Week 2): Pt will perform ambulation x 50' in controlled environment with LRAD and min A of one person PT Short Term Goal 5 (Week 2): Pt will negotiate 4 stairs with L rail with mod A of one person  Skilled Therapeutic Interventions/Progress Updates:   Session focused on functional sit <-> stands, gait training, stair negotiation to prepare for home entry, and d/c planning. Pt required increased assist for sit to stands today with poor controlled descent into w/c on all occasions requiring mod to max assist overall which pt attributes it to pain in knees and hoping "shots tomorrow will help" as well as him "working my legs out" hard today. Very limited gait trials due to increased pain x 3' and x 8' with close w/c follow (+2) for safety. Stair negotiation on 3" step with with max assist (+2 for safety) but only able to go up/down 1 time. Attempted to trial with L rail only to simulate home set-up but pt unable to perform and required both rails. Discussed option to have someone install a second rail on his stairs at home or suggested a ramp for increased independence with access, especially given fatigue levels in regards to HD 3 x a week. Pt eager about ramp idea and plans to talk with landlord tonight. Family brought in pt's personal walker which is a rollator and pt reports he never  felt comfortable using this. At this time, do not feel this is a safe AD and will recommend pt d/c with wide based heavy duty RW for safe mobility and pt in agreement. Also discussed w/c accessibility due to requiring a wider w/c and the rollator did not fit through doors (26" wide) and current w/c is ~ 31" wide using on CIR. Goals for pt to be able to ambulate in and out of doorways and short distances in household by time of d/c. Pt in agreement. Self propelled back to pt room end of session and ice packs applied to bilateral knees for pain relief.   Therapy Documentation Precautions:  Precautions Precautions: Fall, Back Precaution Comments: cues to adhere to back precautions Required Braces or Orthoses: Other Brace/Splint Other Brace/Splint: R knee brace Restrictions Weight Bearing Restrictions: No   Pain:  reports "high" bilateral knee pain - premedicated and applied ice packs to bilateral knees at end of session.   See Function Navigator for Current Functional Status.   Therapy/Group: Individual Therapy  Canary Brim Ivory Broad, PT, DPT  08/27/2015, 3:52 PM

## 2015-08-27 NOTE — Progress Notes (Signed)
[facility]IC[facility] REHABILITATION     PROGRESS NOTE    Subjective/Complaints: In good spirits today. Happy with knee brace. Right knee still tender and swells after activity.   ROS: Pt denies fever, rash/itching, headache, blurred or double vision, nausea, vomiting, abdominal pain, diarrhea, chest pain, shortness of breath, palpitations, dysuria, dizziness, bleeding, anxiety, or depression   Objective: Vital Signs: Blood pressure 151/74, pulse 90, temperature 98.3 F (36.8 C), temperature source Oral, resp. rate 18, weight 133.5 kg (294 lb 5 oz), SpO2 100 %. Ir Shuntogram/ Fistulagram Right Mod Sed  08/25/2015  INDICATION: Decreased flows at dialysis. EXAM: DIALYSIS AV SHUNTOGRAM/FISTULAGRAM COMPARISON:  Right forearm native AV fistulalysis - 08/27/2013 MEDICATIONS: None. CONTRAST:  76mL OMNIPAQUE IOHEXOL 300 MG/ML  SOLN ANESTHESIA/SEDATION: None FLUOROSCOPY TIME:  1 minute (56 mGy) COMPLICATIONS: None immediate PROCEDURE: The skin overlying the right forearm native AV fistula was prepped with Betadine in a sterile fashion, and a sterile drape was applied covering the operative field. A diagnostic shunt study was performed via an 18 gauge angiocatheter introduced into venous outflow. Venous drainage was assessed to the level of the central veins in the chest. Proximal shunt was studied by reflux maneuver with temporary compression of venous outflow. The angiocath was removed and hemostasis was achieved with manual compression. A dressing was placed. The patient tolerated the procedure well without immediate post procedural complication. FINDINGS: The right forearm native AV fistula is widely patent. There is minimal competitive flow from the main draining vein at the level of the forearm though there is preferential flow through the deep venous system of the right upper extremity at the level of the upper arm. The main draining venous limb is widely patent throughout its imaged course  without evidence of a hemodynamically significant stenosis. The central venous system of the right upper extremity is widely patent to the level of the right atrium. Reflux fistulagram demonstrates wide patency of the arterial limb and anastomosis. IMPRESSION: IMPRESSION Normally functioning right forearm native AV fistula without peripheral or central stenosis. ACCESS: This access remains amenable to future percutaneous interventions as clinically indicated. Electronically Signed   By: Sandi Mariscal M.D.   On: 08/25/2015 16:11    Recent Labs  08/25/15 1730 08/26/15 1450  WBC 13.8* 14.6*  HGB 8.1* 8.8*  HCT 25.0* 26.3*  PLT 434* 422*    Recent Labs  08/25/15 1730 08/26/15 1450  NA 128* 130*  K 5.2* 4.6  CL 90* 92*  GLUCOSE 198* 186*  BUN 108* 63*  CREATININE 13.04* 9.17*  CALCIUM 8.6* 8.5*   CBG (last 3)   Recent Labs  08/26/15 1946 08/26/15 2207 08/27/15 0645  GLUCAP 166* 196* 166*    Wt Readings from Last 3 Encounters:  08/27/15 133.5 kg (294 lb 5 oz)  08/17/15 136.8 kg (301 lb 9.4 oz)  08/10/15 139.254 kg (307 lb)    Physical Exam:  Constitutional: He is oriented to person, place, and time. He appears well-developed and well-nourished.  Morbidly obese male lying in bed. NAD.  HENT:  Head: Normocephalic and atraumatic.  Eyes: Conjunctivae and EOM are normal. Pupils are equal, round, and reactive to light.  Neck: Normal range of motion. Neck supple.   Cardiovascular: Normal rate and regular rhythm.  Respiratory: Effort normal and breath sounds normal. No respiratory distress. He has no wheezes.  GI: Soft. Bowel sounds are normal. He exhibits no distension. There is no tenderness.   Musculoskeletal: right knee with pain during PROM. Mild peripatellar swelling  Neuro: Strength B/l UE 4+/5 throughout LLE hip flexion 4/5, ankle dorsi/plantar flexion 4+/5 RLE: hip flexion 4-/5, ankle dorsi/plantar flexion 4+/5  He is alert and oriented to person, place, and time.   Speech clear.  Able to follow commands without difficulty.  He is alert and oriented to person, place, and time.  He has normal reflexes.  Intentional tremors BUE. Sensation intact to light touchin all 4 limbs  Skin: Skin is warm and dry. Surgical site with honeycomb dressing, drain with minimal drainage.   BLE are improving. Dry flaky skin still present feet and thighs.  Psychiatric: He has a normal mood and affect. His speech is normal. Thought content normal. Cognition and memory are normal.     Assessment/Plan: 1. Functional deficits secondary to T4 osteomyelitis with cord compression/myelopathy which require 3+ hours per day of interdisciplinary therapy in a comprehensive inpatient rehab setting. Physiatrist is providing close team supervision and 24 hour management of active medical problems listed below. Physiatrist and rehab team continue to assess barriers to discharge/monitor patient progress toward functional and medical goals.  Function:  Bathing Bathing position   Position: Shower  Bathing parts Body parts bathed by patient: Right arm, Left arm, Chest, Abdomen, Front perineal area, Right upper leg, Left upper leg, Right lower leg, Left lower leg, Back Body parts bathed by helper: Buttocks  Bathing assist Assist Level: Touching or steadying assistance(Pt > 75%)      Upper Body Dressing/Undressing Upper body dressing   What is the patient wearing?: Pull over shirt/dress     Pull over shirt/dress - Perfomed by patient: Thread/unthread right sleeve, Thread/unthread left sleeve, Put head through opening, Pull shirt over trunk Pull over shirt/dress - Perfomed by helper: Pull shirt over trunk        Upper body assist Assist Level: Supervision or verbal cues      Lower Body Dressing/Undressing Lower body dressing   What is the patient wearing?: Shoes (Refused remainder LB dressing) Underwear - Performed by patient: Thread/unthread right underwear leg,  Thread/unthread left underwear leg Underwear - Performed by helper: Pull underwear up/down Pants- Performed by patient: Thread/unthread left pants leg Pants- Performed by helper: Thread/unthread right pants leg, Pull pants up/down Non-skid slipper socks- Performed by patient: Don/doff right sock, Don/doff left sock Non-skid slipper socks- Performed by helper: Don/doff right sock, Don/doff left sock     Shoes - Performed by patient: Don/doff left shoe, Don/doff right shoe Shoes - Performed by helper: Fasten right, Fasten left, Don/doff right shoe, Don/doff left shoe          Lower body assist Assist for lower body dressing: Touching or steadying assistance (Pt > 75%)      Toileting Toileting Toileting activity did not occur: No continent bowel/bladder event (no bowel, urinal for urine) Toileting steps completed by patient: Performs perineal hygiene Toileting steps completed by helper: Adjust clothing prior to toileting, Performs perineal hygiene, Adjust clothing after toileting Toileting Assistive Devices: Grab bar or rail  Toileting assist Assist level: Two helpers   Transfers Chair/bed transfer   Chair/bed transfer method: Lateral scoot, Stand pivot Chair/bed transfer assist level: Supervision or verbal cues Chair/bed transfer assistive device: Armrests, Medical sales representative Ambulation activity did not occur: Safety/medical concerns   Max distance: 8 Assist level: 2 helpers   Wheelchair   Type: Manual Max wheelchair distance: 200 Assist Level: Supervision or verbal cues  Cognition Comprehension Comprehension assist level: Understands complex 90% of the time/cues 10% of the time  Expression Expression assist level: Expresses complex 90% of the time/cues < 10% of the time  Social Interaction Social Interaction assist level: Interacts appropriately 90% of the time - Needs monitoring or encouragement for participation or interaction.  Problem Solving Problem  solving assist level: Solves basic 75 - 89% of the time/requires cueing 10 - 24% of the time  Memory Memory assist level: Recognizes or recalls 75 - 89% of the time/requires cueing 10 - 24% of the time   Medical Problem List and Plan: 1. 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 DVT Prophylaxis/Anticoagulation: Pharmaceutical: Lovenox indicated 3. Pain Management: Continue oxy IR prn. Continue 12.mcg fentanyl patch,  -ice prn  -activity tolerance improving  -hinged neoprene knee brace RLE-- helpful  -pt is interested in steroid injection. Discussed the fact that it will elevate his sugars initially. Will consider for tomorrow 4. Mood: LCSW to follow for evaluation and support.  5. Neuropsych: This patient is capable of making decisions on his own behalf. 6. Skin/Wound Care: continuelocal care  7. Fluids/Electrolytes/Nutrition: Monitor I/O. ON renal diet with 1200 cc/FR.   -labs with HD 8. ESRD: On HD MWF. Schedule sessions later in afternoons to help with therapy tolerance.   -shuntogram revealed normal flow yesterday 9. HTN: Monitor BID. Continue Norvasc.   -pressures improve post HD 10. Anemia of chronic disease:following up with HD (8.1) 11. DM type 2:   use SSI for elevated BS.   -added amaryl for hyperglemica---1mg  daily, pt on board and udnerstandst the importance of improved glycemic control. education provided 12. OSA: Continue use of CPAP.  13. Constipation:   -continue lax/soft  -prn sorbitol   LOS (Days) 10 A FACE TO FACE EVALUATION WAS PERFORMED  Alayza Pieper T 08/27/2015 8:57 AM

## 2015-08-27 NOTE — Progress Notes (Signed)
Physical Therapy Session Note  Patient Details  Name: Seth [name] MRN: [id number] Date of Birth: [birth year]  Today's Date: 08/27/2015 PT Individual Time: 0900-1000 PT Individual Time Calculation (min): 60 min   Short Term Goals: Week 2:  PT Short Term Goal 1 (Week 2): Pt will perform dynamic standing balance activities with consistent min A and improved knee control with brace PT Short Term Goal 2 (Week 2): Pt will perform basic and car transfers with consistent min A  PT Short Term Goal 3 (Week 2): Pt will perform w/c mobility x 150' in controlled and simulated home environment 150' Mod I PT Short Term Goal 4 (Week 2): Pt will perform ambulation x 50' in controlled environment with LRAD and min A of one person PT Short Term Goal 5 (Week 2): Pt will negotiate 4 stairs with L rail with mod A of one person  Skilled Therapeutic Interventions/Progress Updates:    Pt with pants in wash so declined OOB/leaving room/standing exercises until he has that back due to feeling uncomfortable (declined wearing a gown). Seated EOB addressed pt donning knee brace while maintaining precautions, seated therex for functional LE strengthening, and d/c planning discussion in regards to equipment (called family to bring in personal walker and obtain measurements) and home accessibility. Seated therex included heel raises, toe raises, LAQ, and marches x 20 reps each BLE and then with 2# ankle weights the same exercises repeated for the second set. Also seated hip adduction with ball between knees x 20 reps with 5 second hold.   Therapy Documentation Precautions:  Precautions Precautions: Fall, Back Precaution Comments: cues to adhere to back precautions Required Braces or Orthoses: Other Brace/Splint Other Brace/Splint: R knee brace Restrictions Weight Bearing Restrictions: No  Pain: Received pain medication for knees and back rated at 10/10.  Donned R knee brace while seated EOB.   See Function  Navigator for Current Functional Status.   Therapy/Group: Individual Therapy  Canary Brim Ivory Broad, PT, DPT  08/27/2015, 11:55 AM

## 2015-08-28 ENCOUNTER — Inpatient Hospital Stay (HOSPITAL_COMMUNITY): Payer: Non-veteran care | Admitting: Occupational Therapy

## 2015-08-28 ENCOUNTER — Inpatient Hospital Stay (HOSPITAL_COMMUNITY): Payer: Medicare Other

## 2015-08-28 ENCOUNTER — Other Ambulatory Visit: Payer: Self-pay

## 2015-08-28 LAB — CBC
HCT: 27.7 % — ABNORMAL LOW (ref 39.0–52.0)
Hemoglobin: 8.8 g/dL — ABNORMAL LOW (ref 13.0–17.0)
MCH: 29.8 pg (ref 26.0–34.0)
MCHC: 31.8 g/dL (ref 30.0–36.0)
MCV: 93.9 fL (ref 78.0–100.0)
PLATELETS: 454 10*3/uL — AB (ref 150–400)
RBC: 2.95 MIL/uL — AB (ref 4.22–5.81)
RDW: 15 % (ref 11.5–15.5)
WBC: 13.8 10*3/uL — ABNORMAL HIGH (ref 4.0–10.5)

## 2015-08-28 LAB — RENAL FUNCTION PANEL
ALBUMIN: 3.1 g/dL — AB (ref 3.5–5.0)
Anion gap: 15 (ref 5–15)
BUN: 64 mg/dL — ABNORMAL HIGH (ref 6–20)
CALCIUM: 8.9 mg/dL (ref 8.9–10.3)
CO2: 24 mmol/L (ref 22–32)
CREATININE: 10.25 mg/dL — AB (ref 0.61–1.24)
Chloride: 94 mmol/L — ABNORMAL LOW (ref 101–111)
GFR, EST AFRICAN AMERICAN: 6 mL/min — AB (ref >60)
GFR, EST NON AFRICAN AMERICAN: 5 mL/min — AB (ref >60)
Glucose, Bld: 117 mg/dL — ABNORMAL HIGH (ref 65–99)
PHOSPHORUS: 4.4 mg/dL (ref 2.5–4.6)
Potassium: 4.7 mmol/L (ref 3.5–5.1)
SODIUM: 133 mmol/L — AB (ref 135–145)

## 2015-08-28 LAB — GLUCOSE, CAPILLARY
GLUCOSE-CAPILLARY: 95 mg/dL (ref 65–99)
Glucose-Capillary: 133 mg/dL — ABNORMAL HIGH (ref 65–99)
Glucose-Capillary: 112 mg/dL — ABNORMAL HIGH (ref 65–99)
Glucose-Capillary: 225 mg/dL — ABNORMAL HIGH (ref 65–99)

## 2015-08-28 MED ORDER — ALTEPLASE 2 MG IJ SOLR
2.0000 mg | Freq: Once | INTRAMUSCULAR | Status: DC | PRN
  Filled 2015-08-28: qty 2

## 2015-08-28 MED ORDER — SODIUM CHLORIDE 0.9 % IV SOLN: 100.0000 mL | INTRAVENOUS | Status: DC | PRN

## 2015-08-28 MED ORDER — LIDOCAINE HCL 1 % IJ SOLN
5.0000 mL | Freq: Once | INTRAMUSCULAR | Status: AC
  Administered 2015-08-31: 5 mL
  Filled 2015-08-28: qty 5

## 2015-08-28 MED ORDER — PENTAFLUOROPROP-TETRAFLUOROETH EX AERO: 1.0000 "application " | INHALATION_SPRAY | CUTANEOUS | Status: DC | PRN

## 2015-08-28 MED ORDER — TRIAMCINOLONE ACETONIDE 40 MG/ML IJ SUSP
40.0000 mg | Freq: Once | INTRAMUSCULAR | Status: AC
  Administered 2015-08-31: 40 mg via INTRA_ARTICULAR
  Filled 2015-08-28: qty 1

## 2015-08-28 MED ORDER — LIDOCAINE HCL (PF) 1 % IJ SOLN
5.0000 mL | INTRAMUSCULAR | Status: DC | PRN
  Filled 2015-08-28: qty 5

## 2015-08-28 MED ORDER — HEPARIN SODIUM (PORCINE) 1000 UNIT/ML DIALYSIS
1000.0000 [IU] | INTRAMUSCULAR | Status: DC | PRN
  Filled 2015-08-28: qty 1

## 2015-08-28 MED ORDER — LIDOCAINE-PRILOCAINE 2.5-2.5 % EX CREA: 1.0000 "application " | TOPICAL_CREAM | CUTANEOUS | Status: DC | PRN

## 2015-08-28 NOTE — Patient Outreach (Signed)
Unsuccessful attempt made to contact patient via telephone at [phone]. HIPPA compliant message left for patient with this RNCM's contact information.  Will make another attempt to contact patient on Tuesday, November 22.

## 2015-08-28 NOTE — Progress Notes (Signed)
[facility] ROUNDING NOTE   Subjective:   Interval History: appears stable and participating in rehab  Objective:  Vital signs in last 24 hours:  Temp:  [97.5 F (36.4 C)-98.1 F (36.7 C)] 98.1 F (36.7 C) (11/18 0453) Pulse Rate:  [81-88] 81 (11/18 0453) Resp:  [18] 18 (11/18 0453) BP: (138-142)/(69-70) 142/70 mmHg (11/18 0453) SpO2:  [100 %] 100 % (11/18 0453) Weight:  [135.7 kg (299 lb 2.6 oz)] 135.7 kg (299 lb 2.6 oz) (11/18 0453)  Weight change: 2.2 kg (4 lb 13.6 oz) Filed Weights   08/25/15 2110 08/27/15 0555 08/28/15 0453  Weight: 137.2 kg (302 lb 7.5 oz) 133.5 kg (294 lb 5 oz) 135.7 kg (299 lb 2.6 oz)    Intake/Output: I/O last 3 completed shifts: In: 6 [P.O.:720] Out: 0    Intake/Output this shift:     Obese male  CVS- RRR RS- CTA ABD- BS present soft non-distended EXT- no edema AVF thrill and bruit   Basic Metabolic Panel:  Recent Labs Lab 08/21/15 1645 08/24/15 1600 08/25/15 1730 08/26/15 1450  NA 130* 129* 128* 130*  K 4.2 4.9 5.2* 4.6  CL 93* 90* 90* 92*  CO2 23 20* 20* 24  GLUCOSE 141* 144* 198* 186*  BUN 72* 88* 108* 63*  CREATININE 9.82* 11.28* 13.04* 9.17*  CALCIUM 9.2 8.8* 8.6* 8.5*  PHOS 3.9 4.4 5.3* 4.1    Liver Function Tests:  Recent Labs Lab 08/21/15 1645 08/24/15 1600 08/25/15 1730 08/26/15 1450  ALBUMIN 2.8* 3.0* 2.8* 2.9*   No results for input(s): LIPASE, AMYLASE in the last 168 hours. No results for input(s): AMMONIA in the last 168 hours.  CBC:  Recent Labs Lab 08/21/15 1645 08/24/15 1600 08/25/15 1730 08/26/15 1450  WBC 16.2* 16.0* 13.8* 14.6*  HGB 8.5* 8.9* 8.1* 8.8*  HCT 26.3* 26.4* 25.0* 26.3*  MCV 93.3 91.7 91.9 93.9  PLT 439* 458* 434* 422*    Cardiac Enzymes: No results for input(s): CKTOTAL, CKMB, CKMBINDEX, TROPONINI in the last 168 hours.  BNP: Invalid input(s): POCBNP  CBG:  Recent Labs Lab 08/27/15 1140 08/27/15 1658 08/27/15 2142 08/28/15 0648 08/28/15 1136   GLUCAP 131* 95 152* 133* 112*    Microbiology: Results for orders placed or performed during the hospital encounter of 08/10/15  Blood culture (routine x 2)     Status: None   Collection Time: 08/11/15  2:45 AM  Result Value Ref Range Status   Specimen Description BLOOD LEFT ARM  Final   Special Requests BOTTLES DRAWN AEROBIC AND ANAEROBIC 5CC  Final   Culture NO GROWTH 5 DAYS  Final   Report Status 08/16/2015 FINAL  Final  Blood culture (routine x 2)     Status: None   Collection Time: 08/11/15  8:27 AM  Result Value Ref Range Status   Specimen Description BLOOD LEFT HAND  Final   Special Requests BOTTLES DRAWN AEROBIC ONLY  5CC  Final   Culture NO GROWTH 5 DAYS  Final   Report Status 08/16/2015 FINAL  Final  Culture, routine-abscess     Status: None   Collection Time: 08/12/15  8:59 PM  Result Value Ref Range Status   Specimen Description ABSCESS  Final   Special Requests THORACIC SPINE  Final   Gram Stain   Final    NO WBC SEEN NO SQUAMOUS EPITHELIAL CELLS SEEN NO ORGANISMS SEEN Performed at [facility]    Culture   Final    NO GROWTH 3 DAYS Performed  at [facility]    Report Status 08/16/2015 FINAL  Final  MRSA PCR Screening     Status: None   Collection Time: 08/13/15 12:50 AM  Result Value Ref Range Status   MRSA by PCR NEGATIVE NEGATIVE Final    Comment:        The GeneXpert MRSA Assay (FDA approved for NASAL specimens only), is one component of a comprehensive MRSA colonization surveillance program. It is not intended to diagnose MRSA infection nor to guide or monitor treatment for MRSA infections.     Coagulation Studies: No results for input(s): LABPROT, INR in the last 72 hours.  Urinalysis: No results for input(s): COLORURINE, LABSPEC, PHURINE, GLUCOSEU, HGBUR, BILIRUBINUR, KETONESUR, PROTEINUR, UROBILINOGEN, NITRITE, LEUKOCYTESUR in the last 72 hours.  Invalid input(s): APPERANCEUR    Imaging: No results  found.   Medications:     . amLODipine  10 mg Oral Daily  . antiseptic oral rinse  7 mL Mouth Rinse QID  . aspirin  81 mg Oral Daily  . calcitRIOL  0.5 mcg Oral Q M,W,F  . calcium acetate  2,001 mg Oral TID WC  .  ceFAZolin (ANCEF) IV  2 g Intravenous Q M,W,F-HD  . chlorhexidine gluconate  15 mL Mouth Rinse BID  . cholecalciferol  1,000 Units Oral Daily  . cinacalcet  30 mg Oral Q breakfast  . darbepoetin (ARANESP) injection - DIALYSIS  100 mcg Intravenous Q Mon-HD  . enoxaparin (LOVENOX) injection  30 mg Subcutaneous Q24H  . fentaNYL  12.5 mcg Transdermal Q72H  . glimepiride  1 mg Oral Q breakfast  . hydrocerin   Topical BID  . insulin aspart  0-15 Units Subcutaneous TID WC  . insulin aspart  0-5 Units Subcutaneous QHS  . lidocaine  5 mL Other Once  . pantoprazole  40 mg Oral QHS  . senna-docusate  2 tablet Oral QHS  . 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 ESRD:  1. MWF [place] via AVF Dialysis tomorrow 2. AVF  2. Holding heparin currently Post operatively 3. Pathogical Fracture at T4 and T5 1. S/p laminectomy 11/2 2. 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 On cefazolin, recent Stah Lugdunesis bacteremia- abx will need to be continued at least 6 weeks- anticipated end date around 12/15 4. Only on HD for 2y, seems unlikely for renal osteodystrophy 4. HTN/Vol: Aim for EDW lower than previous, BP stable- requiring huge amounts of UF per tx- 5. Anemia: Hb improved still in the 8.8 range added aranesp - transfuse if needed 6. MBD: cont PhosLo, calcitriol and cinacaclet,  calcium and phosphorus  controlled 1. Last PTH 407 2. Cont binder- increased phoslo to 3 with meals- is better  Will plan dialysis in AM     LOS: 11 Ellary Casamento,Senft W @TODAY @11 :59 AM

## 2015-08-28 NOTE — Progress Notes (Signed)
Occupational Therapy Session Note  Patient Details  Name: SHILO DODGEN MRN: [id number] Date of Birth: 1966/09/03  Today's Date: 08/28/2015 OT Individual Time: [id number] and 1345-1430 OT Individual Time Calculation (min): 60 min and 45 min OT Missed Time: 45 min (pt fatigue)   Short Term Goals: Week 2:  OT Short Term Goal 1 (Week 2): Pt willl complete stand pivot transfer to toilet with min A OT Short Term Goal 2 (Week 2): Pt will don pants sit <> stand with min A OT Short Term Goal 3 (Week 2): Pt will complete toileting task with max A  Skilled Therapeutic Interventions/Progress Updates:     Session One: Pt seen for OT therapy session focusing on ADL re-training, functional activity tolerance, and functional ambulation.  Pt sitting EOB upon arrival, voicing 10/10 pain in R knee, reporting being pre-medicated and agreeable to tx session. He transferred to w/c via stand pivot using RW with min A and increased time.  He completed grooming tasks at sink, completing in sitting/ standing. Pt declined some standing tasks due to "morning stiffness' and not wanting to tire himself out this early in the morning. Seated grooming tasks completed mod I, and standing grooming tasks completed with steadying assist. Pt able to tolerate ~30 seconds of static standing before requesting seated rest break.  He self propelled w/c to therapy gym for UE strengthening. In gym, pt ambulated 14ft using RW with min A and VCs for posture. He required extended seated rest break following functional ambulation due to decreased functional endurance. Pt left sitting in w/c at end of session with hand off to PT.  Pt educated throughout session regarding energy conservation, pacing self throughout tasks, home access, planning for "bad days", and d/c planning.   Session Two: Pt seen for OT ADL bathing and dressing session. Pt sitting in w/c upon arrival, ready for showering task. Attempted to ambulate into bathroom, however,  due to R knee stiffness from just having ice applied, pt  Unable to ambulate that distance. He was taken into bathroom in w/c and completed stand pivot transfer onto roll in shower chair using grab bars with steadying assist. He bathed seated, completed buttock hygiene via toilet hole cut out in chair. He dressed from w/c level, standing at sink with steadying assist to pull pants up. He recalled technique for using sock aid to don B socks and completed grooming tasks seated in w/c at sink mod I. Pt refused all other OT tx session following B&D due to fatigue and awaiting dialysis this afternoon.  Pt left sitting in w/c at end of session, all needs in reach.   Therapy Documentation Precautions:  Precautions Precautions: Fall, Back Precaution Comments: cues to adhere to back precautions Required Braces or Orthoses: Other Brace/Splint Other Brace/Splint: R knee brace Restrictions Weight Bearing Restrictions: No Pain: Pain Assessment Pain Score: 10-Worst pain ever Pain Location: Knee Pain Descriptors / Indicators: Aching ( ) Pain Intervention(s): Repositioned;Ambulation/increased activity (Pt reported being pre-medicated 15 min prior to therapy)  See Function Navigator for Current Functional Status.   Therapy/Group: Individual Therapy  Lewis, Sanjit Mcmichael C 08/28/2015, 7:11 AM

## 2015-08-28 NOTE — Progress Notes (Signed)
Pt. Places cpap on himself. No issues at this time.

## 2015-08-28 NOTE — Progress Notes (Signed)
Physical Therapy Session Note  Patient Details  Name: Seth [name] MRN: [id number] Date of Birth: [birth year]  Today's Date: 08/28/2015 PT Individual Time: 0930-1100 PT Individual Time Calculation (min): 90 min   Short Term Goals: Week 2:  PT Short Term Goal 1 (Week 2): Pt will perform dynamic standing balance activities with consistent min A and improved knee control with brace PT Short Term Goal 2 (Week 2): Pt will perform basic and car transfers with consistent min A  PT Short Term Goal 3 (Week 2): Pt will perform w/c mobility x 150' in controlled and simulated home environment 150' Mod I PT Short Term Goal 4 (Week 2): Pt will perform ambulation x 50' in controlled environment with LRAD and min A of one person PT Short Term Goal 5 (Week 2): Pt will negotiate 4 stairs with L rail with mod A of one person  Skilled Therapeutic Interventions/Progress Updates:    Session focused on functional transfers for sit <-> stands (min assist) and squat pivot transfers (supervision), gait training with RW, stair negotiation training for home entry practice, strength and endurance training for BLE therex, and w/c mobility for functional mobility training and parts management. Seated therex with 2# weights including heel/toe raises, LAQ, and marches x 20 reps each BLE. Seated hip adduction squeezes with soft ball between knees with 5 second hold x 20 reps. Kinetron from w/c in seated position for functional strengthening x 10 min with rest breaks as needed on 25 cm/sec resistance. Gait with RW with overall min assist x 83' with pt recalling cues for sequencing ("push, step, tighen") and +2 for close w/c follow for safety. Stairs with max assist on 3" steps with bilateral rails with cues for foot placement and sequence. Pt reports he is planning to talk with his landlord about a second rail or ramp to increase safety with stair negotiation. Ice packs applied to bilateral knees at end of session for inflammation  control and pain relief. Recommended pt to use throughout the day today between therapy sessions. Pt continues to make good progress.   Therapy Documentation Precautions:  Precautions Precautions: Fall, Back Precaution Comments: cues to adhere to back precautions Required Braces or Orthoses: Other Brace/Splint Other Brace/Splint: R knee brace Restrictions Weight Bearing Restrictions: No  Pain: Pain tolerable today.   See Function Navigator for Current Functional Status.   Therapy/Group: Individual Therapy  Canary Brim Ivory Broad, PT, DPT  08/28/2015, 11:43 AM

## 2015-08-28 NOTE — Progress Notes (Signed)
[facility]     PROGRESS NOTE    Subjective/Complaints: Very pleased with brace and overall therapy progress. Pain improved. Would still like steroid injection in right knee   ROS: Pt denies fever, rash/itching, headache, blurred or double vision, nausea, vomiting, abdominal pain, diarrhea, chest pain, shortness of breath, palpitations, dysuria, dizziness, bleeding, anxiety, or depression   Objective: Vital Signs: Blood pressure 142/70, pulse 81, temperature 98.1 F (36.7 C), temperature source Oral, resp. rate 18, weight 135.7 kg (299 lb 2.6 oz), SpO2 100 %. No results found.  Recent Labs  08/25/15 1730 08/26/15 1450  WBC 13.8* 14.6*  HGB 8.1* 8.8*  HCT 25.0* 26.3*  PLT 434* 422*    Recent Labs  08/25/15 1730 08/26/15 1450  NA 128* 130*  K 5.2* 4.6  CL 90* 92*  GLUCOSE 198* 186*  BUN 108* 63*  CREATININE 13.04* 9.17*  CALCIUM 8.6* 8.5*   CBG (last 3)   Recent Labs  08/27/15 1658 08/27/15 2142 08/28/15 0648  GLUCAP 95 152* 133*    Wt Readings from Last 3 Encounters:  08/28/15 135.7 kg (299 lb 2.6 oz)  08/17/15 136.8 kg (301 lb 9.4 oz)  08/10/15 139.254 kg (307 lb)    Physical Exam:  Constitutional: He is oriented to person, place, and time. He appears well-developed and well-nourished.  Morbidly obese male lying in bed. NAD.  HENT:  Head: Normocephalic and atraumatic.  Eyes: Conjunctivae and EOM are normal. Pupils are equal, round, and reactive to light.  Neck: Normal range of motion. Neck supple.   Cardiovascular: Normal rate and regular rhythm.  Respiratory: Effort normal and breath sounds normal. No respiratory distress. He has no wheezes.  GI: Soft. Bowel sounds are normal. He exhibits no distension. There is no tenderness.   Musculoskeletal: right knee with pain during PROM. Mild peripatellar swelling  Neuro: Strength B/l UE 4+/5 throughout LLE hip flexion 4/5, ankle dorsi/plantar flexion 4+/5 RLE: hip  flexion 4-/5, ankle dorsi/plantar flexion 4+/5  He is alert and oriented to person, place, and time.  Speech clear.  Able to follow commands without difficulty.  He is alert and oriented to person, place, and time.  He has normal reflexes.  Intentional tremors BUE. Sensation intact to light touchin all 4 limbs  Skin: Skin is warm and dry. Surgical site with honeycomb dressing, drain with minimal drainage.   BLE are improving. Dry flaky skin still present feet and thighs.  Psychiatric: He has a normal mood and affect. His speech is normal. Thought content normal. Cognition and memory are normal.     Assessment/Plan: 1. Functional deficits secondary to T4 osteomyelitis with cord compression/myelopathy which require 3+ hours per day of interdisciplinary therapy in a comprehensive inpatient rehab setting. Physiatrist is providing close team supervision and 24 hour management of active medical problems listed below. Physiatrist and rehab team continue to assess barriers to discharge/monitor patient progress toward functional and medical goals.  Function:  Bathing Bathing position   Position: Shower  Bathing parts Body parts bathed by patient: Right arm, Left arm, Chest, Abdomen, Front perineal area, Right upper leg, Left upper leg, Right lower leg, Left lower leg, Back Body parts bathed by helper: Buttocks  Bathing assist Assist Level: Touching or steadying assistance(Pt > 75%)      Upper Body Dressing/Undressing Upper body dressing   What is the patient wearing?: Pull over shirt/dress     Pull over shirt/dress - Perfomed by patient: Thread/unthread right sleeve, Thread/unthread left sleeve,  Put head through opening, Pull shirt over trunk Pull over shirt/dress - Perfomed by helper: Pull shirt over trunk        Upper body assist Assist Level: Supervision or verbal cues      Lower Body Dressing/Undressing Lower body dressing   What is the patient wearing?: Pants,  Shoes Underwear - Performed by patient: Thread/unthread right underwear leg, Thread/unthread left underwear leg Underwear - Performed by helper: Pull underwear up/down Pants- Performed by patient: Thread/unthread right pants leg, Thread/unthread left pants leg Pants- Performed by helper: Thread/unthread right pants leg, Pull pants up/down Non-skid slipper socks- Performed by patient: Don/doff right sock, Don/doff left sock Non-skid slipper socks- Performed by helper: Don/doff right sock, Don/doff left sock     Shoes - Performed by patient: Don/doff right shoe, Don/doff left shoe Shoes - Performed by helper: Fasten right, Fasten left, Don/doff right shoe, Don/doff left shoe          Lower body assist Assist for lower body dressing: Touching or steadying assistance (Pt > 75%)      Toileting Toileting Toileting activity did not occur: No continent bowel/bladder event (no bowel, urinal for urine) Toileting steps completed by patient: Performs perineal hygiene Toileting steps completed by helper: Adjust clothing prior to toileting, Performs perineal hygiene, Adjust clothing after toileting Toileting Assistive Devices: Grab bar or rail  Toileting assist Assist level: Two helpers   Transfers Chair/bed transfer   Chair/bed transfer method: Lateral scoot, Stand pivot Chair/bed transfer assist level: Supervision or verbal cues Chair/bed transfer assistive device: Armrests, Medical sales representative Ambulation activity did not occur: Safety/medical concerns   Max distance: 8 Assist level: 2 helpers   Wheelchair   Type: Manual Max wheelchair distance: 150 Assist Level: No help, No cues, assistive device, takes more than reasonable amount of time  Cognition Comprehension Comprehension assist level: Understands complex 90% of the time/cues 10% of the time  Expression Expression assist level: Expresses complex 90% of the time/cues < 10% of the time  Social Interaction Social  Interaction assist level: Interacts appropriately 90% of the time - Needs monitoring or encouragement for participation or interaction.  Problem Solving Problem solving assist level: Solves basic 75 - 89% of the time/requires cueing 10 - 24% of the time  Memory Memory assist level: Recognizes or recalls 75 - 89% of the time/requires cueing 10 - 24% of the time   Medical Problem List and Plan: 1. 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 DVT Prophylaxis/Anticoagulation: Pharmaceutical: Lovenox indicated 3. Pain Management: Continue oxy IR prn. Continue 12.mcg fentanyl patch,  -ice prn  -activity tolerance improving  -hinged neoprene knee brace RLE has been quite beneficial for support  -will inject right knee later today.  4. Mood: LCSW to follow for evaluation and support.  5. Neuropsych: This patient is capable of making decisions on his own behalf. 6. Skin/Wound Care: continuelocal care  7. Fluids/Electrolytes/Nutrition: Monitor I/O. ON renal diet with 1200 cc/FR.   -labs with HD 8. ESRD: On HD MWF. Schedule sessions later in afternoons to help with therapy tolerance.   -shuntogram revealed normal flow  9. HTN: Monitor BID. Continue Norvasc.   -pressures improve post HD 10. Anemia of chronic disease:following up with HD (8.1) 11. DM type 2:   use SSI for elevated BS.   -added amaryl for hyperglemica---1mg  daily with improvement 12. OSA: Continue use of CPAP.  13. Constipation:   -  continue lax/soft  -prn sorbitol   LOS (Days) 11 A FACE TO FACE EVALUATION WAS PERFORMED  SWARTZ,ZACHARY T 08/28/2015 9:16 AM

## 2015-08-29 ENCOUNTER — Inpatient Hospital Stay (HOSPITAL_COMMUNITY): Payer: Non-veteran care | Admitting: Occupational Therapy

## 2015-08-29 ENCOUNTER — Inpatient Hospital Stay (HOSPITAL_COMMUNITY): Payer: Medicare Other | Admitting: Physical Therapy

## 2015-08-29 DIAGNOSIS — K5901 Slow transit constipation: Secondary | ICD-10-CM

## 2015-08-29 LAB — GLUCOSE, CAPILLARY
Glucose-Capillary: 130 mg/dL — ABNORMAL HIGH (ref 65–99)
Glucose-Capillary: 144 mg/dL — ABNORMAL HIGH (ref 65–99)
Glucose-Capillary: 97 mg/dL (ref 65–99)
Glucose-Capillary: 112 mg/dL — ABNORMAL HIGH (ref 65–99)

## 2015-08-29 MED ORDER — LIDOCAINE-PRILOCAINE 2.5-2.5 % EX CREA: 1.0000 "application " | TOPICAL_CREAM | CUTANEOUS | Status: DC | PRN

## 2015-08-29 MED ORDER — ALTEPLASE 2 MG IJ SOLR: 2.0000 mg | Freq: Once | INTRAMUSCULAR | Status: DC | PRN

## 2015-08-29 MED ORDER — LIDOCAINE HCL (PF) 1 % IJ SOLN: 5.0000 mL | INTRAMUSCULAR | Status: DC | PRN

## 2015-08-29 MED ORDER — SODIUM CHLORIDE 0.9 % IV SOLN: 100.0000 mL | INTRAVENOUS | Status: DC | PRN

## 2015-08-29 MED ORDER — PENTAFLUOROPROP-TETRAFLUOROETH EX AERO: 1.0000 "application " | INHALATION_SPRAY | CUTANEOUS | Status: DC | PRN

## 2015-08-29 MED ORDER — HEPARIN SODIUM (PORCINE) 1000 UNIT/ML DIALYSIS: 1000.0000 [IU] | INTRAMUSCULAR | Status: DC | PRN

## 2015-08-29 NOTE — Progress Notes (Signed)
[facility]     PROGRESS NOTE    Subjective/Complaints: Patient seen and examined this AM sitting up in his chair watching TV. He is looking forward to watching football tomorrow. He is ready for his therapist to begin today.  ROS: Denies CP, SOB, N/V/D.   Objective: Vital Signs: Blood pressure 159/85, pulse 87, temperature 98.3 F (36.8 C), temperature source Oral, resp. rate 18, weight 135.172 kg (298 lb), SpO2 100 %. No results found.  Recent Labs  08/26/15 1450 08/28/15 1700  WBC 14.6* 13.8*  HGB 8.8* 8.8*  HCT 26.3* 27.7*  PLT 422* 454*    Recent Labs  08/26/15 1450 08/28/15 1708  NA 130* 133*  K 4.6 4.7  CL 92* 94*  GLUCOSE 186* 117*  BUN 63* 64*  CREATININE 9.17* 10.25*  CALCIUM 8.5* 8.9   CBG (last 3)   Recent Labs  08/28/15 1638 08/28/15 2104 08/29/15 0651  GLUCAP 95 225* 130*    Wt Readings from Last 3 Encounters:  08/28/15 135.172 kg (298 lb)  08/17/15 136.8 kg (301 lb 9.4 oz)  08/10/15 139.254 kg (307 lb)    Physical Exam:  Constitutional: He appears well-developed and well-nourished.  Morbidly obese male. NAD.  Head: Normocephalic and atraumatic.  Eyes: Conjunctivae and EOM are normal.  Neck: Normal range of motion.  Cardiovascular: Normal rate and regular rhythm.  Respiratory: Effort normal and breath sounds normal. No respiratory distress. He has no wheezes.  GI: Soft. Bowel sounds are normal. He exhibits no distension. There is no tenderness.  Musculoskeletal: Mild edema right knee. Mildly TTP.  Neuro:  Antigravity strength throughout. Mild decrease in strength proximal right lower extremity versus bilateral upper extremities and left lower extremity.  He is alert and oriented.  Speech clear.  Able to follow commands without difficulty.  Skin: Skin is warm and dry. Surgical site C/D/I . Psychiatric: He has a normal mood and affect. His speech is normal. Thought content normal. Cognition and  memory are normal.   Assessment/Plan: 1. Functional deficits secondary to T4 osteomyelitis with cord compression/myelopathy which require 3+ hours per day of interdisciplinary therapy in a comprehensive inpatient rehab setting. Physiatrist is providing close team supervision and 24 hour management of active medical problems listed below. Physiatrist and rehab team continue to assess barriers to discharge/monitor patient progress toward functional and medical goals.  Function:  Bathing Bathing position   Position: Shower  Bathing parts Body parts bathed by patient: Right arm, Left arm, Chest, Abdomen, Front perineal area, Right upper leg, Left upper leg, Right lower leg, Left lower leg, Back, Buttocks Body parts bathed by helper: Buttocks  Bathing assist Assist Level: Supervision or verbal cues      Upper Body Dressing/Undressing Upper body dressing   What is the patient wearing?: Pull over shirt/dress     Pull over shirt/dress - Perfomed by patient: Thread/unthread right sleeve, Thread/unthread left sleeve, Put head through opening, Pull shirt over trunk Pull over shirt/dress - Perfomed by helper: Pull shirt over trunk        Upper body assist Assist Level: More than reasonable time      Lower Body Dressing/Undressing Lower body dressing   What is the patient wearing?: Pants, Shoes, Underwear, Non-skid slipper socks Underwear - Performed by patient: Thread/unthread right underwear leg, Thread/unthread left underwear leg, Pull underwear up/down Underwear - Performed by helper: Pull underwear up/down Pants- Performed by patient: Thread/unthread right pants leg, Thread/unthread left pants leg, Pull pants up/down Pants-  Performed by helper: Thread/unthread right pants leg, Pull pants up/down Non-skid slipper socks- Performed by patient: Don/doff right sock, Don/doff left sock Non-skid slipper socks- Performed by helper: Don/doff right sock, Don/doff left sock     Shoes -  Performed by patient: Don/doff right shoe, Don/doff left shoe Shoes - Performed by helper: Fasten right, Fasten left          Lower body assist Assist for lower body dressing: Touching or steadying assistance (Pt > 75%)      Toileting Toileting Toileting activity did not occur: No continent bowel/bladder event (no bowel, urinal for urine) Toileting steps completed by patient: Performs perineal hygiene Toileting steps completed by helper: Adjust clothing prior to toileting, Performs perineal hygiene, Adjust clothing after toileting Toileting Assistive Devices: Grab bar or rail  Toileting assist Assist level: Two helpers   Transfers Chair/bed transfer   Chair/bed transfer method: Squat pivot Chair/bed transfer assist level: Supervision or verbal cues Chair/bed transfer assistive device: Armrests, Other (R knee brace)     Locomotion Ambulation Ambulation activity did not occur: Safety/medical concerns   Max distance: 8 Assist level: 2 helpers (min assist wiht +2 for close w/c follow)   Wheelchair   Type: Manual Max wheelchair distance: 150 Assist Level: No help, No cues, assistive device, takes more than reasonable amount of time  Cognition Comprehension Comprehension assist level: Understands complex 90% of the time/cues 10% of the time  Expression Expression assist level: Expresses complex 90% of the time/cues < 10% of the time  Social Interaction Social Interaction assist level: Interacts appropriately 90% of the time - Needs monitoring or encouragement for participation or interaction.  Problem Solving Problem solving assist level: Solves basic 75 - 89% of the time/requires cueing 10 - 24% of the time  Memory Memory assist level: Recognizes or recalls 75 - 89% of the time/requires cueing 10 - 24% of the time   Medical Problem List and Plan: 1. Functional deficits secondary to Cord compression secondary to Osteomyelitis/diskitis T4 and T5 with pathologic fracture, able to  move both LEs 2. DVT Prophylaxis/Anticoagulation: Pharmaceutical: Lovenox indicated 3. Pain Management: Continue oxy IR prn. Continue 12.mcg fentanyl patch,  -ice prn  -activity tolerance improving  -hinged neoprene knee brace RLE has been quite beneficial for support 4. Mood: LCSW to follow for evaluation and support.  5. Neuropsych: This patient is capable of making decisions on his own behalf. 6. Skin/Wound Care: continuelocal care  7. Fluids/Electrolytes/Nutrition: Monitor I/O. ON renal diet with 1200 cc/FR.   -labs with HD, stable 8. ESRD: On HD MWF. Schedule sessions later in afternoons to help with therapy tolerance.   -shuntogram revealed normal flow  9. HTN: Monitor BID. Continue Norvasc.   -pressures improve post HD 10. Anemia of chronic disease:following up with HD   - Hb 8.8 on 11/18 11. DM type 2:  use SSI for elevated BS.   -added amaryl for hyperglemica---1mg  daily with improvement  - Fasting CBGs 130 this AM 12. OSA: Continue use of CPAP.  13. Constipation:   -continue lax/soft  -prn sorbitol  LOS (Days) 12 A FACE TO FACE EVALUATION WAS PERFORMED  Mea Ozga Lorie Phenix 08/29/2015 11:46 AM

## 2015-08-29 NOTE — Progress Notes (Signed)
[facility] ROUNDING NOTE   Subjective:   Interval History:  Cliford is doing well  --- actually had dialysis yesterday and scheduled for tomorrow  Objective:  Vital signs in last 24 hours:  Temp:  [97.4 F (36.3 C)-98.3 F (36.8 C)] 97.7 F (36.5 C) (11/19 1404) Pulse Rate:  [77-94] 87 (11/19 1404) Resp:  [16-18] 18 (11/19 1404) BP: (108-159)/(58-85) 135/70 mmHg (11/19 1404) SpO2:  [99 %-100 %] 99 % (11/19 1404) Weight:  [135 kg (297 lb 9.9 oz)-135.626 kg (299 lb)] 135.626 kg (299 lb) (11/19 1500)  Weight change: -0.7 kg (-1 lb 8.7 oz) Filed Weights   08/28/15 2030 08/28/15 2104 08/29/15 1500  Weight: 135 kg (297 lb 9.9 oz) 135.172 kg (298 lb) 135.626 kg (299 lb)    Intake/Output: I/O last 3 completed shifts: In: 240 [P.O.:240] Out: 2800 [Other:2800]   Intake/Output this shift:  Total I/O In: 361 [P.O.:361] Out: 1 [Urine:1]  CVS- RRR RS- CTA ABD- BS present soft non-distended EXT- no edema   Basic Metabolic Panel:  Recent Labs Lab 08/24/15 1600 08/25/15 1730 08/26/15 1450 08/28/15 1708  NA 129* 128* 130* 133*  K 4.9 5.2* 4.6 4.7  CL 90* 90* 92* 94*  CO2 20* 20* 24 24  GLUCOSE 144* 198* 186* 117*  BUN 88* 108* 63* 64*  CREATININE 11.28* 13.04* 9.17* 10.25*  CALCIUM 8.8* 8.6* 8.5* 8.9  PHOS 4.4 5.3* 4.1 4.4    Liver Function Tests:  Recent Labs Lab 08/24/15 1600 08/25/15 1730 08/26/15 1450 08/28/15 1708  ALBUMIN 3.0* 2.8* 2.9* 3.1*   No results for input(s): LIPASE, AMYLASE in the last 168 hours. No results for input(s): AMMONIA in the last 168 hours.  CBC:  Recent Labs Lab 08/24/15 1600 08/25/15 1730 08/26/15 1450 08/28/15 1700  WBC 16.0* 13.8* 14.6* 13.8*  HGB 8.9* 8.1* 8.8* 8.8*  HCT 26.4* 25.0* 26.3* 27.7*  MCV 91.7 91.9 93.9 93.9  PLT 458* 434* 422* 454*    Cardiac Enzymes: No results for input(s): CKTOTAL, CKMB, CKMBINDEX, TROPONINI in the last 168 hours.  BNP: Invalid input(s): POCBNP  CBG:  Recent  Labs Lab 08/28/15 1638 08/28/15 2104 08/29/15 0651 08/29/15 1123 08/29/15 1624  GLUCAP 95 225* 130* 144* 97    Microbiology: Results for orders placed or performed during the hospital encounter of 08/10/15  Blood culture (routine x 2)     Status: None   Collection Time: 08/11/15  2:45 AM  Result Value Ref Range Status   Specimen Description BLOOD LEFT ARM  Final   Special Requests BOTTLES DRAWN AEROBIC AND ANAEROBIC 5CC  Final   Culture NO GROWTH 5 DAYS  Final   Report Status 08/16/2015 FINAL  Final  Blood culture (routine x 2)     Status: None   Collection Time: 08/11/15  8:27 AM  Result Value Ref Range Status   Specimen Description BLOOD LEFT HAND  Final   Special Requests BOTTLES DRAWN AEROBIC ONLY  5CC  Final   Culture NO GROWTH 5 DAYS  Final   Report Status 08/16/2015 FINAL  Final  Culture, routine-abscess     Status: None   Collection Time: 08/12/15  8:59 PM  Result Value Ref Range Status   Specimen Description ABSCESS  Final   Special Requests THORACIC SPINE  Final   Gram Stain   Final    NO WBC SEEN NO SQUAMOUS EPITHELIAL CELLS SEEN NO ORGANISMS SEEN Performed at [employer]   Final  NO GROWTH 3 DAYS Performed at [facility]    Report Status 08/16/2015 FINAL  Final  MRSA PCR Screening     Status: None   Collection Time: 08/13/15 12:50 AM  Result Value Ref Range Status   MRSA by PCR NEGATIVE NEGATIVE Final    Comment:        The GeneXpert MRSA Assay (FDA approved for NASAL specimens only), is one component of a comprehensive MRSA colonization surveillance program. It is not intended to diagnose MRSA infection nor to guide or monitor treatment for MRSA infections.     Coagulation Studies: No results for input(s): LABPROT, INR in the last 72 hours.  Urinalysis: No results for input(s): COLORURINE, LABSPEC, PHURINE, GLUCOSEU, HGBUR, BILIRUBINUR, KETONESUR, PROTEINUR, UROBILINOGEN, NITRITE, LEUKOCYTESUR in the last 72  hours.  Invalid input(s): APPERANCEUR    Imaging: No results found.   Medications:     . amLODipine  10 mg Oral Daily  . antiseptic oral rinse  7 mL Mouth Rinse QID  . aspirin  81 mg Oral Daily  . calcitRIOL  0.5 mcg Oral Q M,W,F  . calcium acetate  2,001 mg Oral TID WC  .  ceFAZolin (ANCEF) IV  2 g Intravenous Q M,W,F-HD  . chlorhexidine gluconate  15 mL Mouth Rinse BID  . cholecalciferol  1,000 Units Oral Daily  . cinacalcet  30 mg Oral Q breakfast  . darbepoetin (ARANESP) injection - DIALYSIS  100 mcg Intravenous Q Mon-HD  . enoxaparin (LOVENOX) injection  30 mg Subcutaneous Q24H  . fentaNYL  12.5 mcg Transdermal Q72H  . glimepiride  1 mg Oral Q breakfast  . hydrocerin   Topical BID  . insulin aspart  0-15 Units Subcutaneous TID WC  . insulin aspart  0-5 Units Subcutaneous QHS  . lidocaine  5 mL Other Once  . pantoprazole  40 mg Oral QHS  . senna-docusate  2 tablet Oral QHS  . triamcinolone acetonide  40 mg Intra-articular Once   sodium chloride, sodium chloride, acetaminophen, albuterol, alteplase, bisacodyl, diphenhydrAMINE, guaiFENesin-dextromethorphan, heparin, iohexol, lidocaine (PF), lidocaine-prilocaine, oxyCODONE, pentafluoroprop-tetrafluoroeth, promethazine **OR** promethazine **OR** promethazine, sodium chloride, sodium phosphate, sorbitol, traZODone  Assessment/ Plan:  24M ESRD MWF with T4-T5 pathological fracture presumed OM s/p surgical correction 08/12/15 1. ESRD:  1. MWF [facility] via AVF Dialysis tomorrow 2. AVF  2. Holding heparin currently Post operatively 3. Pathogical Fracture at T4 and T5 1. S/p laminectomy 11/2 2. 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 On cefazolin, recent Stah Lugdunesis bacteremia- abx will need to be continued at least 6 weeks- anticipated end date around 12/15 4. Only on HD for 2y, seems unlikely for renal osteodystrophy 4. HTN/Vol:  Continues with ultrafiltration and volume status very hard  to assess due to patients habitus 5. Anemia: Hb improved still in the 8.8 range added aranesp - transfuse if needed 6. MBD: cont PhosLo, calcitriol and cinacaclet,  calcium and phosphorus controlled 1. Last PTH 407 2. Cont binder- increased phoslo to 3 with meals- is better Dialysis will be tomorrow which is the holiday schedule MWF patients    LOS: 12 Laina Guerrieri,Mattioli W @TODAY @6 :19 PM

## 2015-08-29 NOTE — Progress Notes (Signed)
Physical Therapy Session Note  Patient Details  Name: Seth [name] MRN: [id number] Date of Birth: January 14, 1966  Today's Date: 08/29/2015 PT Individual Time: 0900-1000 PT Individual Time Calculation (min): 60 min   Short Term Goals: Week 1:  PT Short Term Goal 1 (Week 1): Pt will be able to perform transfer with 1 person assist with LRAD PT Short Term Goal 1 - Progress (Week 1): Met PT Short Term Goal 2 (Week 1): Pt will be able to initiate w/c propulsion PT Short Term Goal 2 - Progress (Week 1): Met PT Short Term Goal 3 (Week 1): Pt will be able to perform sit to stand with +2 assist without use of lift equipment PT Short Term Goal 3 - Progress (Week 1): Met  Skilled Therapeutic Interventions/Progress Updates:  Pt was seen bedside in the am. Donned R knee brace prior to treatment. Pt propelled w/c about 150 feet for UEs strengthening and endurance. Pt performed multiple sit to stand transfers with rolling walker and S with verbal cues. Pt ambulated with rolling walker and c/s to min guard for distances of 15 and 25 feet with second person w/c follow for safety. Pt utilized Kinetron in seated position x 5 mins at 30 cm per second without rest break. Pt returned to room and left sitting up in w/c with call bell within reach.   Therapy Documentation Precautions:  Precautions Precautions: Fall, Back Precaution Comments: cues to adhere to back precautions Required Braces or Orthoses: Other Brace/Splint Other Brace/Splint: R knee brace Restrictions Weight Bearing Restrictions: No General:   Pain: Pt c/o mod to severe R knee pain.   See Function Navigator for Current Functional Status.   Therapy/Group: Individual Therapy  Dub Amis 08/29/2015, 2:45 PM

## 2015-08-29 NOTE — Progress Notes (Signed)
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 ID saw- plan Cefazolin for 6 weeks with HD.  WBC 13.8 K, Afeb.   Echo done 11/6- poor study d/t habitus, pt moving. No vegetation noted on results  9/24: BCx: Staph lugdunensis  11/1 blood: neg 11/1: op cx: neg  Ancef 11/2 >>  (09/24/15)  Plan -Ancef 2 gms post HD on days dialysis received; has been scheduled appropriately - f/u HD tolerance

## 2015-08-29 NOTE — Progress Notes (Signed)
Occupational Therapy Session Note  Patient Details  Name: Seth [name] MRN: [id number] Date of Birth: 1966-06-03  Today's Date: 08/29/2015 OT Individual Time: 1100-1130 OT Individual Time Calculation (min): 30 min    Short Term Goals: Week 2:  OT Short Term Goal 1 (Week 2): Pt willl complete stand pivot transfer to toilet with min A OT Short Term Goal 2 (Week 2): Pt will don pants sit <> stand with min A OT Short Term Goal 3 (Week 2): Pt will complete toileting task with max A  Skilled Therapeutic Interventions/Progress Updates:    Pt seen for OT session focusing on functional activity tolerance and functional standing balance. Pt in w/c upon arrival, agreeable to tx session, however declined wanting to leave room as he was "expecting phone calls".  Session focused on pt standing at sink, working to increase standing tolerance and balance, progressing from requiring B UE support to only using one UE support in prep for functional task. Pt completed sit >stand with supervision. He required increased assist for stand > sit due to decreased decentric control. Pt unable to stand completely off of edge of w/c due to R knee buckling. He was able to tolerate ~15 seconds of standing with one UE support while he combed hair and ~1-2 seconds of no UE support. Pt participated in game of hang man while standing at sink for distraction during standing trials. He completed ~6 sit <> stands throughout session, able to tolerate at most ~2 minutes of static standing before requesting seated rest break.  Pt left sitting in w/c at end of session, family members entering as therapist exited.  Pt educated throughout session regarding activity progression, OT goals, and d/c planning.   Therapy Documentation Precautions:  Precautions Precautions: Fall, Back Precaution Comments: cues to adhere to back precautions Required Braces or Orthoses: Other Brace/Splint Other Brace/Splint: R knee  brace Restrictions Weight Bearing Restrictions: No Pain: Pain Assessment Pain Assessment:Voiced soreness in R knee, reported being pre-medicated prior to tx session.   See Function Navigator for Current Functional Status.   Therapy/Group: Individual Therapy  Lewis, Demani Weyrauch C 08/29/2015, 7:10 AM

## 2015-08-30 ENCOUNTER — Inpatient Hospital Stay (HOSPITAL_COMMUNITY): Payer: Medicare Other | Admitting: Occupational Therapy

## 2015-08-30 LAB — RENAL FUNCTION PANEL
ALBUMIN: 3 g/dL — AB (ref 3.5–5.0)
Anion gap: 13 (ref 5–15)
BUN: 55 mg/dL — ABNORMAL HIGH (ref 6–20)
CALCIUM: 8.9 mg/dL (ref 8.9–10.3)
CO2: 24 mmol/L (ref 22–32)
CREATININE: 9.53 mg/dL — AB (ref 0.61–1.24)
Chloride: 95 mmol/L — ABNORMAL LOW (ref 101–111)
GFR calc Af Amer: 6 mL/min — ABNORMAL LOW (ref >60)
GFR calc non Af Amer: 6 mL/min — ABNORMAL LOW (ref >60)
GLUCOSE: 145 mg/dL — AB (ref 65–99)
Phosphorus: 4.8 mg/dL — ABNORMAL HIGH (ref 2.5–4.6)
Potassium: 4.6 mmol/L (ref 3.5–5.1)
SODIUM: 132 mmol/L — AB (ref 135–145)

## 2015-08-30 LAB — CBC
HCT: 27.9 % — ABNORMAL LOW (ref 39.0–52.0)
HEMOGLOBIN: 8.6 g/dL — AB (ref 13.0–17.0)
MCH: 29.3 pg (ref 26.0–34.0)
MCHC: 30.8 g/dL (ref 30.0–36.0)
MCV: 94.9 fL (ref 78.0–100.0)
Platelets: 416 10*3/uL — ABNORMAL HIGH (ref 150–400)
RBC: 2.94 MIL/uL — AB (ref 4.22–5.81)
RDW: 14.7 % (ref 11.5–15.5)
WBC: 10.9 10*3/uL — ABNORMAL HIGH (ref 4.0–10.5)

## 2015-08-30 LAB — GLUCOSE, CAPILLARY
GLUCOSE-CAPILLARY: 153 mg/dL — AB (ref 65–99)
GLUCOSE-CAPILLARY: 133 mg/dL — AB (ref 65–99)
Glucose-Capillary: 279 mg/dL — ABNORMAL HIGH (ref 65–99)
Glucose-Capillary: 107 mg/dL — ABNORMAL HIGH (ref 65–99)

## 2015-08-30 MED ORDER — ALTEPLASE 2 MG IJ SOLR: 2.0000 mg | Freq: Once | INTRAMUSCULAR | Status: DC | PRN

## 2015-08-30 MED ORDER — SODIUM CHLORIDE 0.9 % IV SOLN: 100.0000 mL | INTRAVENOUS | Status: DC | PRN

## 2015-08-30 MED ORDER — LIDOCAINE HCL (PF) 1 % IJ SOLN: 5.0000 mL | INTRAMUSCULAR | Status: DC | PRN

## 2015-08-30 MED ORDER — DARBEPOETIN ALFA 100 MCG/0.5ML IJ SOSY
PREFILLED_SYRINGE | INTRAMUSCULAR | Status: AC
  Administered 2015-08-30: 100 ug via INTRAVENOUS
  Filled 2015-08-30: qty 0.5

## 2015-08-30 MED ORDER — HEPARIN SODIUM (PORCINE) 1000 UNIT/ML DIALYSIS: 1000.0000 [IU] | INTRAMUSCULAR | Status: DC | PRN

## 2015-08-30 MED ORDER — PENTAFLUOROPROP-TETRAFLUOROETH EX AERO: 1.0000 "application " | INHALATION_SPRAY | CUTANEOUS | Status: DC | PRN

## 2015-08-30 MED ORDER — LIDOCAINE-PRILOCAINE 2.5-2.5 % EX CREA: 1.0000 "application " | TOPICAL_CREAM | CUTANEOUS | Status: DC | PRN

## 2015-08-30 NOTE — Procedures (Signed)
I have seen and examined this patient and agree with the plan of care . Seen on dialysis with no complaints   Vitals stable  AVF with no difficulty in cannulation  Underwent fistulogram  11/ 15   Seth [name],Seth [name] 08/30/2015, 11:31 AM

## 2015-08-30 NOTE — Progress Notes (Signed)
RT checked sterile water it is full. Patient stated he would put the CPAP on when he is ready to sleep.

## 2015-08-30 NOTE — Progress Notes (Signed)
[facility] PHYSICAL MEDICINE & REHABILITATION     PROGRESS NOTE    Subjective/Complaints: Patient seen and examined this morning lying in bed watching TV. He is excited today as it is, "football Sunday". He states the critic say the [place] ravens will beat the [employer] but he does not believe this to be the case.  ROS: Denies CP, SOB, N/V/D.  Objective: Vital Signs: Blood pressure 150/79, pulse 87, temperature 97.7 F (36.5 C), temperature source Oral, resp. rate 18, weight 136.805 kg (301 lb 9.6 oz), SpO2 99 %. No results found.  Recent Labs  08/28/15 1700  WBC 13.8*  HGB 8.8*  HCT 27.7*  PLT 454*    Recent Labs  08/28/15 1708  NA 133*  K 4.7  CL 94*  GLUCOSE 117*  BUN 64*  CREATININE 10.25*  CALCIUM 8.9   CBG (last 3)   Recent Labs  08/29/15 1624 08/29/15 2053 08/30/15 0656  GLUCAP 97 112* 153*    Wt Readings from Last 3 Encounters:  08/30/15 136.805 kg (301 lb 9.6 oz)  08/17/15 136.8 kg (301 lb 9.4 oz)  08/10/15 139.254 kg (307 lb)    Physical Exam:  Constitutional: He appears well-developed and well-nourished.  Morbidly obese male. NAD.  Head: Normocephalic and atraumatic.  Eyes: Conjunctivae and EOM are normal.  Neck: Normal range of motion.  Cardiovascular: Normal rate and regular rhythm.  Respiratory: Effort normal and breath sounds normal. No respiratory distress. He has no wheezes.  GI: Soft. Bowel sounds are normal. He exhibits no distension. There is no tenderness.  Musculoskeletal: Mild edema right knee. Mildly TTP.  Neuro:  Antigravity strength throughout. Mild decrease in strength proximal right lower extremity versus bilateral upper extremities and left lower extremity.  He is alert and oriented.  Speech clear.  Able to follow commands without difficulty.  Skin: Skin is warm and dry. Surgical site C/D/I . Psychiatric: He has a normal mood and affect. His speech is normal. Thought content normal. Cognition and memory are  normal.   Assessment/Plan: 1. Functional deficits secondary to T4 osteomyelitis with cord compression/myelopathy which require 3+ hours per day of interdisciplinary therapy in a comprehensive inpatient rehab setting. Physiatrist is providing close team supervision and 24 hour management of active medical problems listed below. Physiatrist and rehab team continue to assess barriers to discharge/monitor patient progress toward functional and medical goals.  Function:  Bathing Bathing position   Position: Shower  Bathing parts Body parts bathed by patient: Right arm, Left arm, Chest, Abdomen, Front perineal area, Right upper leg, Left upper leg, Right lower leg, Left lower leg, Back, Buttocks Body parts bathed by helper: Buttocks  Bathing assist Assist Level: Supervision or verbal cues      Upper Body Dressing/Undressing Upper body dressing   What is the patient wearing?: Pull over shirt/dress     Pull over shirt/dress - Perfomed by patient: Thread/unthread right sleeve, Thread/unthread left sleeve, Put head through opening, Pull shirt over trunk Pull over shirt/dress - Perfomed by helper: Pull shirt over trunk        Upper body assist Assist Level: More than reasonable time      Lower Body Dressing/Undressing Lower body dressing   What is the patient wearing?: Pants, Shoes, Underwear, Non-skid slipper socks Underwear - Performed by patient: Thread/unthread right underwear leg, Thread/unthread left underwear leg, Pull underwear up/down Underwear - Performed by helper: Pull underwear up/down Pants- Performed by patient: Thread/unthread right pants leg, Thread/unthread left pants leg, Pull pants up/down  Pants- Performed by helper: Thread/unthread right pants leg, Pull pants up/down Non-skid slipper socks- Performed by patient: Don/doff right sock, Don/doff left sock Non-skid slipper socks- Performed by helper: Don/doff right sock, Don/doff left sock     Shoes - Performed by  patient: Don/doff right shoe, Don/doff left shoe Shoes - Performed by helper: Fasten right, Fasten left          Lower body assist Assist for lower body dressing: Touching or steadying assistance (Pt > 75%)      Toileting Toileting Toileting activity did not occur: No continent bowel/bladder event (no bowel, urinal for urine) Toileting steps completed by patient: Performs perineal hygiene Toileting steps completed by helper: Adjust clothing prior to toileting, Performs perineal hygiene, Adjust clothing after toileting Toileting Assistive Devices: Grab bar or rail  Toileting assist Assist level: Two helpers   Transfers Chair/bed transfer   Chair/bed transfer method: Squat pivot Chair/bed transfer assist level: Supervision or verbal cues Chair/bed transfer assistive device: Armrests, Other     Locomotion Ambulation Ambulation activity did not occur: Safety/medical concerns   Max distance: 25 Assist level: 2 helpers   Wheelchair   Type: Manual Max wheelchair distance: 150 Assist Level: No help, No cues, assistive device, takes more than reasonable amount of time  Cognition Comprehension Comprehension assist level: Understands complex 90% of the time/cues 10% of the time  Expression Expression assist level: Expresses complex 90% of the time/cues < 10% of the time  Social Interaction Social Interaction assist level: Interacts appropriately 90% of the time - Needs monitoring or encouragement for participation or interaction.  Problem Solving Problem solving assist level: Solves basic 75 - 89% of the time/requires cueing 10 - 24% of the time  Memory Memory assist level: Recognizes or recalls 75 - 89% of the time/requires cueing 10 - 24% of the time   Medical Problem List and Plan: 1. 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 DVT Prophylaxis/Anticoagulation: Pharmaceutical:  Lovenox indicated 3. Pain Management: Continue oxy IR prn. Continue 12.mcg fentanyl patch,  -ice prn  -activity tolerance improving  -hinged neoprene knee brace RLE has been quite beneficial for support 4. Mood: LCSW to follow for evaluation and support.  5. Neuropsych: This patient is capable of making decisions on his own behalf. 6. Skin/Wound Care: continuelocal care  7. Fluids/Electrolytes/Nutrition: Monitor I/O. ON renal diet with 1200 cc/FR.   -labs with HD, stable 8. ESRD: On HD MWF. Schedule sessions later in afternoons to help with therapy tolerance.   -shuntogram revealed normal flow   - 150/79 this AM 9. HTN: Monitor BID. Continue Norvasc.   -pressures improve post HD 10. Anemia of chronic disease:following up with HD   - Hb 8.8 on 11/18 11. DM type 2:  use SSI for elevated BS.   -added amaryl for hyperglemica---1mg  daily with improvement  - Fasting CBGs 153 this AM 12. OSA: Continue use of CPAP.  13. Constipation:   -continue lax/soft  -prn sorbitol  LOS (Days) 13 A FACE TO FACE EVALUATION WAS PERFORMED  Ankit Lorie Phenix 08/30/2015 9:34 AM

## 2015-08-30 NOTE — Progress Notes (Signed)
Occupational Therapy Session Note  Patient Details  Name: KALVEN GANIM MRN: [id number] Date of Birth: 1966-02-19  Today's Date: 08/30/2015 OT Individual Time:  - 0800-0900  60 min      Short Term Goals: Week 1:  OT Short Term Goal 1 (Week 1): Pt will complete toileting task with max A +1 OT Short Term Goal 1 - Progress (Week 1): Met OT Short Term Goal 2 (Week 1): Pt will demonstrate carry over of education of spinal pre-cautions with min VCs OT Short Term Goal 2 - Progress (Week 1): Met OT Short Term Goal 3 (Week 1): Pt will complete sit <> stand at sink with max A +1 OT Short Term Goal 3 - Progress (Week 1): Met OT Short Term Goal 4 (Week 1): Pt will transfer to [place] with +1 assist OT Short Term Goal 4 - Progress (Week 1): Updated due to goal met (Pt no longer requiring use of mechanical lift for transfers) Week 2:  OT Short Term Goal 1 (Week 2): Pt willl complete stand pivot transfer to toilet with min A OT Short Term Goal 2 (Week 2): Pt will don pants sit <> stand with min A OT Short Term Goal 3 (Week 2): Pt will complete toileting task with max A  Skilled Therapeutic Interventions/Progress Updates:    Pt wanted RN to view buttocks.  He did not want to bathe with this OT.   Pt agreed to to AROM EOB.  Activity for UE, head and neck, and LE.  Pt engaged in activities for strength.  Transferred bed>wc with SBA. Left pt at sink to complete bathing with son in room.    Therapy Documentation Precautions:  Precautions Precautions: Fall, Back Precaution Comments: cues to adhere to back precautions Required Braces or Orthoses: Other Brace/Splint Other Brace/Splint: R knee brace Restrictions Weight Bearing Restrictions: No      Pain:  4/10  back         :    See Function Navigator for Current Functional Status.   Therapy/Group: Individual Therapy  Lisa Roca 08/30/2015, 8:27 AM

## 2015-08-31 ENCOUNTER — Inpatient Hospital Stay (HOSPITAL_COMMUNITY): Payer: Medicare Other

## 2015-08-31 ENCOUNTER — Inpatient Hospital Stay (HOSPITAL_COMMUNITY): Payer: Non-veteran care | Admitting: Occupational Therapy

## 2015-08-31 ENCOUNTER — Ambulatory Visit: Payer: Self-pay

## 2015-08-31 DIAGNOSIS — M1711 Unilateral primary osteoarthritis, right knee: Secondary | ICD-10-CM | POA: Insufficient documentation

## 2015-08-31 LAB — GLUCOSE, CAPILLARY
GLUCOSE-CAPILLARY: 290 mg/dL — AB (ref 65–99)
GLUCOSE-CAPILLARY: 279 mg/dL — AB (ref 65–99)
Glucose-Capillary: 113 mg/dL — ABNORMAL HIGH (ref 65–99)
Glucose-Capillary: 164 mg/dL — ABNORMAL HIGH (ref 65–99)

## 2015-08-31 MED ORDER — AMLODIPINE BESYLATE 10 MG PO TABS
10.0000 mg | ORAL_TABLET | Freq: Every day | ORAL | Status: DC
  Administered 2015-09-01 – 2015-09-08 (×8): 10 mg via ORAL
  Filled 2015-08-31 (×8): qty 1

## 2015-08-31 MED ORDER — DARBEPOETIN ALFA 200 MCG/0.4ML IJ SOSY
200.0000 ug | PREFILLED_SYRINGE | INTRAMUSCULAR | Status: DC
  Administered 2015-09-07: 200 ug via INTRAVENOUS
  Filled 2015-08-31: qty 0.4

## 2015-08-31 MED ORDER — RENA-VITE PO TABS
1.0000 | ORAL_TABLET | Freq: Every day | ORAL | Status: DC
  Administered 2015-08-31 – 2015-09-08 (×9): 1 via ORAL
  Filled 2015-08-31 (×9): qty 1

## 2015-08-31 NOTE — Progress Notes (Signed)
[facility] PHYSICAL MEDICINE & REHABILITATION     PROGRESS NOTE    Subjective/Complaints: No new complaints. Right knee better but still sore. Slept well. Pleased that he's getting stronger!  ROS: Pt denies fever, rash/itching, headache, blurred or double vision, nausea, vomiting, abdominal pain, diarrhea, chest pain, shortness of breath, palpitations, dysuria, dizziness, bleeding, anxiety, or depression   Objective: Vital Signs: Blood pressure 161/70, pulse 84, temperature 97.8 F (36.6 C), temperature source Oral, resp. rate 18, weight 130.8 kg (288 lb 5.8 oz), SpO2 99 %. No results found.  Recent Labs  08/28/15 1700 08/30/15 1130  WBC 13.8* 10.9*  HGB 8.8* 8.6*  HCT 27.7* 27.9*  PLT 454* 416*    Recent Labs  08/28/15 1708 08/30/15 1129  NA 133* 132*  K 4.7 4.6  CL 94* 95*  GLUCOSE 117* 145*  BUN 64* 55*  CREATININE 10.25* 9.53*  CALCIUM 8.9 8.9   CBG (last 3)   Recent Labs  08/30/15 1653 08/30/15 2119 08/31/15 0658  GLUCAP 279* 107* 113*    Wt Readings from Last 3 Encounters:  08/31/15 130.8 kg (288 lb 5.8 oz)  08/17/15 136.8 kg (301 lb 9.4 oz)  08/10/15 139.254 kg (307 lb)    Physical Exam:  Constitutional: He is oriented to person, place, and time. He appears well-developed and well-nourished.  Morbidly obese male lying in bed. NAD.  HENT:  Head: Normocephalic and atraumatic.  Eyes: Conjunctivae and EOM are normal. Pupils are equal, round, and reactive to light.  Neck: Normal range of motion. Neck supple.   Cardiovascular: Normal rate and regular rhythm.  Respiratory: Effort normal and breath sounds normal. No respiratory distress. He has no wheezes.  GI: Soft. Bowel sounds are normal. He exhibits no distension. There is no tenderness.   Musculoskeletal: right knee with pain during PROM. Mild peripatellar swelling. Crepitus with PROM/AROM Neuro: Strength B/l UE 4+/5 throughout LLE hip flexion 4/5, ankle dorsi/plantar flexion 4+/5 RLE: hip  flexion 4-/5, ankle dorsi/plantar flexion 4+/5  He is alert and oriented to person, place, and time.  Speech clear.  Able to follow commands without difficulty.  He is alert and oriented to person, place, and time.  He has normal reflexes.  Intentional tremors BUE. Sensation intact to light touchin all 4 limbs  Skin: Skin is warm and dry. Surgical site with honeycomb dressing, drain with minimal drainage.   BLE are improving.   Psychiatric: He has a normal mood and affect. His speech is normal. Thought content normal. Cognition and memory are normal.     Assessment/Plan: 1. Functional deficits secondary to T4 osteomyelitis with cord compression/myelopathy which require 3+ hours per day of interdisciplinary therapy in a comprehensive inpatient rehab setting. Physiatrist is providing close team supervision and 24 hour management of active medical problems listed below. Physiatrist and rehab team continue to assess barriers to discharge/monitor patient progress toward functional and medical goals.  Function:  Bathing Bathing position   Position: Shower  Bathing parts Body parts bathed by patient: Right arm, Left arm, Chest, Abdomen, Front perineal area, Right upper leg, Left upper leg, Right lower leg, Left lower leg, Back Body parts bathed by helper: Buttocks  Bathing assist Assist Level: Supervision or verbal cues      Upper Body Dressing/Undressing Upper body dressing   What is the patient wearing?: Pull over shirt/dress     Pull over shirt/dress - Perfomed by patient: Thread/unthread right sleeve, Thread/unthread left sleeve, Put head through opening, Pull shirt over trunk Pull over  shirt/dress - Perfomed by helper: Pull shirt over trunk        Upper body assist Assist Level: More than reasonable time      Lower Body Dressing/Undressing Lower body dressing   What is the patient wearing?: Pants, Shoes, Underwear, Non-skid slipper socks Underwear - Performed by  patient: Thread/unthread right underwear leg, Thread/unthread left underwear leg, Pull underwear up/down Underwear - Performed by helper: Pull underwear up/down Pants- Performed by patient: Thread/unthread right pants leg, Thread/unthread left pants leg, Pull pants up/down Pants- Performed by helper: Thread/unthread right pants leg, Pull pants up/down Non-skid slipper socks- Performed by patient: Don/doff right sock, Don/doff left sock Non-skid slipper socks- Performed by helper: Don/doff right sock, Don/doff left sock     Shoes - Performed by patient: Don/doff right shoe, Don/doff left shoe Shoes - Performed by helper: Fasten right, Fasten left          Lower body assist Assist for lower body dressing: Touching or steadying assistance (Pt > 75%)      Toileting Toileting Toileting activity did not occur: No continent bowel/bladder event (no bowel, urinal for urine) Toileting steps completed by patient: Performs perineal hygiene Toileting steps completed by helper: Adjust clothing prior to toileting, Performs perineal hygiene, Adjust clothing after toileting Toileting Assistive Devices: Grab bar or rail  Toileting assist Assist level: Two helpers   Transfers Chair/bed transfer   Chair/bed transfer method: Squat pivot Chair/bed transfer assist level: Supervision or verbal cues Chair/bed transfer assistive device: Armrests, Other     Locomotion Ambulation Ambulation activity did not occur: Safety/medical concerns   Max distance: 25 Assist level: 2 helpers   Wheelchair   Type: Manual Max wheelchair distance: 150 Assist Level: No help, No cues, assistive device, takes more than reasonable amount of time  Cognition Comprehension Comprehension assist level: Understands complex 90% of the time/cues 10% of the time  Expression Expression assist level: Expresses complex 90% of the time/cues < 10% of the time  Social Interaction Social Interaction assist level: Interacts  appropriately 90% of the time - Needs monitoring or encouragement for participation or interaction.  Problem Solving Problem solving assist level: Solves basic 75 - 89% of the time/requires cueing 10 - 24% of the time  Memory Memory assist level: Recognizes or recalls 75 - 89% of the time/requires cueing 10 - 24% of the time   Medical Problem List and Plan: 1. 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 DVT Prophylaxis/Anticoagulation: Pharmaceutical: Lovenox indicated 3. Pain Management: Continue oxy IR prn. Continue 12.mcg fentanyl patch,  -ice prn  -activity tolerance improving  -hinged neoprene knee brace RLE has been quite beneficial for support  -After informed consent and preparation of the skin with betadine and isopropyl alcohol, I injected 40mg  kenalog and 4cc of 1% lidocaine into the right knee via anterolateral approach. Additionally, aspiration was performed prior to injection. The patient tolerated well, and no complications were encountered. Afterward the area was cleaned and dressed. Post- injection instructions were provided.  Advised him that we will likely see elevations of his CBG's as a result of this injection. 4. Mood: LCSW to follow for evaluation and support.  5. Neuropsych: This patient is capable of making decisions on his own behalf. 6. Skin/Wound Care: continuelocal care  7. Fluids/Electrolytes/Nutrition: Monitor I/O. ON renal diet with 1200 cc/FR.   -labs with HD 8. ESRD: On HD MWF. Schedule sessions later in afternoons to help  with therapy tolerance.   -shuntogram revealed normal flow  9. HTN: Monitor BID. Continue Norvasc.   -pressures improve post HD 10. Anemia of chronic disease:following up with HD (8.1) 11. DM type 2:   use SSI for elevated BS.   -added amaryl for hyperglemica---1mg  daily with improvement 12. OSA: Continue use of CPAP.  13.  Constipation:   -continue lax/soft  -prn sorbitol   LOS (Days) 14 A FACE TO FACE EVALUATION WAS PERFORMED  Henrry Feil T 08/31/2015 9:00 AM

## 2015-08-31 NOTE — Progress Notes (Signed)
Subjective: Interval History: has no complaint.  Objective: Vital signs in last 24 hours: Temp:  [97.4 F (36.3 C)-97.9 F (36.6 C)] 97.8 F (36.6 C) (11/21 0545) Pulse Rate:  [84-92] 84 (11/21 0545) Resp:  [18-20] 18 (11/21 0545) BP: (125-164)/(70-86) 161/70 mmHg (11/21 0748) SpO2:  [98 %-99 %] 99 % (11/21 0545) Weight:  [130.8 kg (288 lb 5.8 oz)-133.6 kg (294 lb 8.6 oz)] 130.8 kg (288 lb 5.8 oz) (11/21 0545) Weight change: 1.175 kg (2 lb 9.4 oz)  Intake/Output from previous day: 11/20 0701 - 11/21 0700 In: 480 [P.O.:480] Out: 4000  Intake/Output this shift:    General appearance: alert, cooperative and morbidly obese Resp: diminished breath sounds bilaterally Cardio: S1, S2 normal and systolic murmur: holosystolic 2/6, blowing at apex GI: massive, pos bs, liver down 6 cm Extremities: AVF RLA  Lab Results:  Recent Labs  08/28/15 1700 08/30/15 1130  WBC 13.8* 10.9*  HGB 8.8* 8.6*  HCT 27.7* 27.9*  PLT 454* 416*   BMET:  Recent Labs  08/28/15 1708 08/30/15 1129  NA 133* 132*  K 4.7 4.6  CL 94* 95*  CO2 24 24  GLUCOSE 117* 145*  BUN 64* 55*  CREATININE 10.25* 9.53*  CALCIUM 8.9 8.9   No results for input(s): PTH in the last 72 hours. Iron Studies: No results for input(s): IRON, TIBC, TRANSFERRIN, FERRITIN in the last 72 hours.  Studies/Results: No results found.  I have reviewed the patient's current medications.  Assessment/Plan: 1 ESRD for HD tomorrow. 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    LOS: 14 days   Esbeydi Manago L 08/31/2015,2:07 PM

## 2015-08-31 NOTE — Progress Notes (Signed)
Physical Therapy Session Note  Patient Details  Name: Seth [name] MRN: [id number] Date of Birth: 1966/05/19  Today's Date: 08/31/2015 PT Individual Time: 1100-1200 PT Individual Time Calculation (min): 60 min   Short Term Goals: Week 2:  PT Short Term Goal 1 (Week 2): Pt will perform dynamic standing balance activities with consistent min A and improved knee control with brace PT Short Term Goal 2 (Week 2): Pt will perform basic and car transfers with consistent min A  PT Short Term Goal 3 (Week 2): Pt will perform w/c mobility x 150' in controlled and simulated home environment 150' Mod I PT Short Term Goal 4 (Week 2): Pt will perform ambulation x 50' in controlled environment with LRAD and min A of one person PT Short Term Goal 5 (Week 2): Pt will negotiate 4 stairs with L rail with mod A of one person  Skilled Therapeutic Interventions/Progress Updates:    Session focused on w/c mobility training, seated therex for BLE HEP, squat pivot transfers with emphasis on technique, and sit to stands with RW. Therex including heel/toe raises, LAQ, marches, and hip adduction with ball squeeze with 5 second hold all 30 reps each. Also performed marching in standing with one set with 2# ankle weights on second without x 30 reps each to increase foot clearance and aid with stair negotiation. Supervision to min assist for sit to stands during session with therapist providing facilitation at R knee during transitional movement.    Therapy Documentation Precautions:  Precautions Precautions: Fall, Back Precaution Comments: cues to adhere to back precautions Required Braces or Orthoses: Other Brace/Splint Other Brace/Splint: R knee brace Restrictions Weight Bearing Restrictions: No   Pain: 3/10 pain in knees - premedicated.    See Function Navigator for Current Functional Status.   Therapy/Group: Individual Therapy  Canary Brim Ivory Broad, PT, DPT  08/31/2015, 12:14 PM

## 2015-08-31 NOTE — Progress Notes (Signed)
Physical Therapy Session Note  Patient Details  Name: Seth [name] MRN: [id number] Date of Birth: 1966/02/20  Today's Date: 08/31/2015 PT Individual Time: 0900-1000 PT Individual Time Calculation (min): 60 min   Short Term Goals: Week 2:  PT Short Term Goal 1 (Week 2): Pt will perform dynamic standing balance activities with consistent min A and improved knee control with brace PT Short Term Goal 2 (Week 2): Pt will perform basic and car transfers with consistent min A  PT Short Term Goal 3 (Week 2): Pt will perform w/c mobility x 150' in controlled and simulated home environment 150' Mod I PT Short Term Goal 4 (Week 2): Pt will perform ambulation x 50' in controlled environment with LRAD and min A of one person PT Short Term Goal 5 (Week 2): Pt will negotiate 4 stairs with L rail with mod A of one person  Skilled Therapeutic Interventions/Progress Updates:    Session focused on functional w/c mobility for endurance and strengthening, gait training with RW, stair negotiation, basic transfers, and BLE strengthening on [place] (seated in w/c x 5 min on resistance 30 cm/sec). Gait with RW x 120' with min assist overall with improved gait pattern noted (step through) and w/c follow initially but likely not needed in future unless pt very fatigued. Progressed stair negotiation today to 6" steps with bilateral rails (pt spoke with landlord this weekend and planning to install a second rail for home entry) with max assist of 1 (+2 nearby for safety initially due to first attempt at standard step height) ascending forward and descending backwards. Pt making good progress. LTG for stairs modified to reflect plan to have 2 rails for home entry.   Therapy Documentation Precautions:  Precautions Precautions: Fall, Back Precaution Comments: cues to adhere to back precautions Required Braces or Orthoses: Other Brace/Splint Other Brace/Splint: R knee brace Restrictions Weight Bearing Restrictions:  No  Pain: Denies pain.   See Function Navigator for Current Functional Status.   Therapy/Group: Individual Therapy  Canary Brim Ivory Broad, PT, DPT  08/31/2015, 10:58 AM

## 2015-08-31 NOTE — Progress Notes (Signed)
Occupational Therapy Session Note  Patient Details  Name: Seth [name] MRN: [id number] Date of Birth: 1966-08-16  Today's Date: 08/31/2015 OT Individual Time: [id number] and 1400-1500 OT Individual Time Calculation (min): 60 min and 60 min   Short Term Goals: Week 2:  OT Short Term Goal 1 (Week 2): Pt willl complete stand pivot transfer to toilet with min A OT Short Term Goal 2 (Week 2): Pt will don pants sit <> stand with min A OT Short Term Goal 3 (Week 2): Pt will complete toileting task with max A  Skilled Therapeutic Interventions/Progress Updates:    Session One: Pt seen for OT therapy session focusing on ADL re-training.  Pt sitting EOB upon arrival with RN present. He sat EOB to eat breakfast. He required mod-max VCs to attend to task and complete tasks as pt very distracted this morning. He reports being very anxious about upcoming cortizone shot to knee. Plan for session was to complete bathing task at shower level in ADL apartment in order to utilize tub bench for transfer as he will do at d/c. However, due to pt wanting to eat breakfast and nursing care taking place, shower completed in pt's room with pt sitting on roll in shower chair.Will practice simulated tub bench transfer at next session  He completed stand pivot transfer with steadying assist onto shower chair. Pt wanting to do things "his way", not following VCs for safest transfer technique/ sequence.   Pt dressed seated in w/c at sink with increased time. VCs provided for sequencing of task to ease LB dressing (threading pants before placing non skid socks, etc). He stood at sink and pulled pants up with supervision. Socks and shoes donned mod I using AE. Pt left sitting in w/c at end of session, all needs in reach.  Discussion while pt eating breakfast regarding d/c planning, pt reporting that family plans to have carpet pulled up in house and hard wood floors placed for ease of use with w/c and RW. Talked about ahving  ramp placed, however, pt reports not wanting to become dependent on ramp and thus not wanting to have one placed. Therapist educated regarding having ramp placed for "bad days" and times when he is tired including after dyalysis.   Session Two: Pt seen for OT therapy session focusing on functional transfers and functional standing balance/ endurance. Pt in w/c upon arrival, agreeable to tx session. He self propelled w/c throughout unit for UE strengthening and endurance. Pt ambulated into bathroom to complete simulated tub bench transfer with supervision and cues for sequencing and technique. Pt able to complete sit <> stand with supervision to RW. Pt refused to practice toilet transfer, stating that the toilet in apartment was lower than the toilet at his house and therefore refused to practice. Education provided regarding use of 3-1 BSC over toilet for ease of sit <> stand, however, pt refused, stating that it showed he was "handicapped" and not wanting to use. Educated regarding benefits and temporary use, however, pt cont to decline.  In therapy gym, pt stood to complete functional task using B UEs simultaneously for standing balance in prep for LB dressing. Pt able to cross midline and shift weight, tolerating ~2 minutes of standing to complete task. Extended seated rest break required standing. Pt ambulated 86ft towards room at end of session, w/c follow for safety. Pt left in room at end of session, sitting in w/c with all needs in reach.   Therapy Documentation Precautions:  Precautions Precautions:  Fall, Back Precaution Comments: cues to adhere to back precautions Required Braces or Orthoses: Other Brace/Splint Other Brace/Splint: R knee brace Restrictions Weight Bearing Restrictions: No Pain:   Mentioned overall soreness, however, no increase in pain.   See Function Navigator for Current Functional Status.   Therapy/Group: Individual Therapy  Lewis, Ashleigh Arya C 08/31/2015, 7:09 AM

## 2015-09-01 ENCOUNTER — Inpatient Hospital Stay (HOSPITAL_COMMUNITY): Payer: Medicare Other | Admitting: Occupational Therapy

## 2015-09-01 ENCOUNTER — Inpatient Hospital Stay (HOSPITAL_COMMUNITY): Payer: Medicare Other

## 2015-09-01 ENCOUNTER — Inpatient Hospital Stay (HOSPITAL_COMMUNITY): Payer: Medicare Other | Admitting: *Deleted

## 2015-09-01 ENCOUNTER — Inpatient Hospital Stay (HOSPITAL_COMMUNITY): Payer: Non-veteran care | Admitting: Occupational Therapy

## 2015-09-01 DIAGNOSIS — M1711 Unilateral primary osteoarthritis, right knee: Secondary | ICD-10-CM

## 2015-09-01 LAB — GLUCOSE, CAPILLARY
GLUCOSE-CAPILLARY: 175 mg/dL — AB (ref 65–99)
Glucose-Capillary: 160 mg/dL — ABNORMAL HIGH (ref 65–99)
Glucose-Capillary: 261 mg/dL — ABNORMAL HIGH (ref 65–99)

## 2015-09-01 LAB — CBC
HEMATOCRIT: 27.1 % — AB (ref 39.0–52.0)
HEMOGLOBIN: 8.7 g/dL — AB (ref 13.0–17.0)
MCH: 29.5 pg (ref 26.0–34.0)
MCHC: 32.1 g/dL (ref 30.0–36.0)
MCV: 91.9 fL (ref 78.0–100.0)
PLATELETS: 376 10*3/uL (ref 150–400)
RBC: 2.95 MIL/uL — ABNORMAL LOW (ref 4.22–5.81)
RDW: 14.6 % (ref 11.5–15.5)
WBC: 16.7 10*3/uL — ABNORMAL HIGH (ref 4.0–10.5)

## 2015-09-01 LAB — RENAL FUNCTION PANEL
ALBUMIN: 3.3 g/dL — AB (ref 3.5–5.0)
Anion gap: 14 (ref 5–15)
BUN: 70 mg/dL — ABNORMAL HIGH (ref 6–20)
CHLORIDE: 95 mmol/L — AB (ref 101–111)
CO2: 24 mmol/L (ref 22–32)
CREATININE: 10.52 mg/dL — AB (ref 0.61–1.24)
Calcium: 9.2 mg/dL (ref 8.9–10.3)
GFR, EST AFRICAN AMERICAN: 6 mL/min — AB (ref >60)
GFR, EST NON AFRICAN AMERICAN: 5 mL/min — AB (ref >60)
Glucose, Bld: 195 mg/dL — ABNORMAL HIGH (ref 65–99)
PHOSPHORUS: 3.2 mg/dL (ref 2.5–4.6)
Potassium: 4.8 mmol/L (ref 3.5–5.1)
Sodium: 133 mmol/L — ABNORMAL LOW (ref 135–145)

## 2015-09-01 LAB — PHOSPHORUS: PHOSPHORUS: 3.1 mg/dL (ref 2.5–4.6)

## 2015-09-01 LAB — URIC ACID: URIC ACID, SERUM: 8.3 mg/dL — AB (ref 4.4–7.6)

## 2015-09-01 MED ORDER — CEFAZOLIN SODIUM-DEXTROSE 2-3 GM-% IV SOLR
2.0000 g | INTRAVENOUS | Status: AC
  Administered 2015-09-01: 2 g via INTRAVENOUS
  Filled 2015-09-01: qty 50

## 2015-09-01 MED ORDER — LIDOCAINE-PRILOCAINE 2.5-2.5 % EX CREA
1.0000 "application " | TOPICAL_CREAM | CUTANEOUS | Status: DC | PRN
  Filled 2015-09-01: qty 5

## 2015-09-01 MED ORDER — SODIUM CHLORIDE 0.9 % IV SOLN: 100.0000 mL | INTRAVENOUS | Status: DC | PRN

## 2015-09-01 MED ORDER — CALCITRIOL 0.5 MCG PO CAPS
ORAL_CAPSULE | ORAL | Status: AC
  Filled 2015-09-01: qty 1

## 2015-09-01 MED ORDER — ALTEPLASE 2 MG IJ SOLR
2.0000 mg | Freq: Once | INTRAMUSCULAR | Status: DC | PRN
  Filled 2015-09-01: qty 2

## 2015-09-01 MED ORDER — HEPARIN SODIUM (PORCINE) 1000 UNIT/ML DIALYSIS
100.0000 [IU]/kg | INTRAMUSCULAR | Status: DC | PRN
Start: 2015-09-01 — End: 2015-09-01
  Filled 2015-09-01: qty 14

## 2015-09-01 MED ORDER — LIDOCAINE HCL (PF) 1 % IJ SOLN: 5.0000 mL | INTRAMUSCULAR | Status: DC | PRN

## 2015-09-01 MED ORDER — PENTAFLUOROPROP-TETRAFLUOROETH EX AERO: 1.0000 "application " | INHALATION_SPRAY | CUTANEOUS | Status: DC | PRN

## 2015-09-01 MED ORDER — HEPARIN SODIUM (PORCINE) 1000 UNIT/ML DIALYSIS: 1000.0000 [IU] | INTRAMUSCULAR | Status: DC | PRN

## 2015-09-01 NOTE — Progress Notes (Signed)
[place] PHYSICAL MEDICINE & REHABILITATION     PROGRESS NOTE    Subjective/Complaints: Right knee feels better after injection. Has some ice on it today. Very pleased with progress.  ROS: Pt denies fever, rash/itching, headache, blurred or double vision, nausea, vomiting, abdominal pain, diarrhea, chest pain, shortness of breath, palpitations, dysuria, dizziness, bleeding, anxiety, or depression   Objective: Vital Signs: Blood pressure 134/68, pulse 79, temperature 97.6 F (36.4 C), temperature source Oral, resp. rate 18, weight 130.9 kg (288 lb 9.3 oz), SpO2 100 %. No results found.  Recent Labs  08/30/15 1130  WBC 10.9*  HGB 8.6*  HCT 27.9*  PLT 416*    Recent Labs  08/30/15 1129  NA 132*  K 4.6  CL 95*  GLUCOSE 145*  BUN 55*  CREATININE 9.53*  CALCIUM 8.9   CBG (last 3)   Recent Labs  08/31/15 1638 08/31/15 2100 09/01/15 0711  GLUCAP 290* 279* 175*    Wt Readings from Last 3 Encounters:  09/01/15 130.9 kg (288 lb 9.3 oz)  08/17/15 136.8 kg (301 lb 9.4 oz)  08/10/15 139.254 kg (307 lb)    Physical Exam:  Constitutional: He is oriented to person, place, and time. He appears well-developed and well-nourished.  Morbidly obese male lying in bed. NAD.  HENT:  Head: Normocephalic and atraumatic.  Eyes: Conjunctivae and EOM are normal. Pupils are equal, round, and reactive to light.  Neck: Normal range of motion. Neck supple.   Cardiovascular: Normal rate and regular rhythm.  Respiratory: Effort normal and breath sounds normal. No respiratory distress. He has no wheezes.  GI: Soft. Bowel sounds are normal. He exhibits no distension. There is no tenderness.   Musculoskeletal: right knee with pain during PROM. Mild peripatellar swelling. Crepitus with PROM/AROM Neuro: Strength B/l UE 4+/5 throughout LLE hip flexion 4/5, ankle dorsi/plantar flexion 4+/5 RLE: hip flexion 4-/5, ankle dorsi/plantar flexion 4+/5  He is alert and oriented to person,  place, and time.  Speech clear.  Able to follow commands without difficulty.  He is alert and oriented to person, place, and time.  He has normal reflexes.  Intentional tremors BUE. Sensation intact to light touchin all 4 limbs  Skin: Skin is warm and dry. Surgical site with honeycomb dressing, drain with minimal drainage.   BLE are improving.   Psychiatric: He has a normal mood and affect. His speech is normal. Thought content normal. Cognition and memory are normal.     Assessment/Plan: 1. Functional deficits secondary to T4 osteomyelitis with cord compression/myelopathy which require 3+ hours per day of interdisciplinary therapy in a comprehensive inpatient rehab setting. Physiatrist is providing close team supervision and 24 hour management of active medical problems listed below. Physiatrist and rehab team continue to assess barriers to discharge/monitor patient progress toward functional and medical goals.  Function:  Bathing Bathing position   Position: Shower  Bathing parts Body parts bathed by patient: Right arm, Left arm, Chest, Abdomen, Front perineal area, Right upper leg, Left upper leg, Right lower leg, Left lower leg, Back Body parts bathed by helper: Buttocks  Bathing assist Assist Level: Supervision or verbal cues      Upper Body Dressing/Undressing Upper body dressing   What is the patient wearing?: Pull over shirt/dress     Pull over shirt/dress - Perfomed by patient: Thread/unthread right sleeve, Thread/unthread left sleeve, Put head through opening, Pull shirt over trunk Pull over shirt/dress - Perfomed by helper: Pull shirt over trunk  Upper body assist Assist Level: More than reasonable time      Lower Body Dressing/Undressing Lower body dressing   What is the patient wearing?: Socks, Shoes Underwear - Performed by patient: Thread/unthread right underwear leg, Thread/unthread left underwear leg, Pull underwear up/down Underwear - Performed  by helper: Pull underwear up/down Pants- Performed by patient: Thread/unthread right pants leg, Thread/unthread left pants leg, Pull pants up/down Pants- Performed by helper: Thread/unthread right pants leg, Pull pants up/down Non-skid slipper socks- Performed by patient: Don/doff right sock, Don/doff left sock Non-skid slipper socks- Performed by helper: Don/doff right sock, Don/doff left sock   Socks - Performed by helper: Don/doff right sock, Don/doff left sock Shoes - Performed by patient: Don/doff right shoe, Don/doff left shoe Shoes - Performed by helper: Fasten right, Fasten left          Lower body assist Assist for lower body dressing: Supervision or verbal cues      Toileting Toileting Toileting activity did not occur: No continent bowel/bladder event (no bowel, urinal for urine) Toileting steps completed by patient: Performs perineal hygiene Toileting steps completed by helper: Adjust clothing prior to toileting, Performs perineal hygiene, Adjust clothing after toileting Toileting Assistive Devices: Grab bar or rail  Toileting assist Assist level: Two helpers   Transfers Chair/bed transfer   Chair/bed transfer method: Squat pivot Chair/bed transfer assist level: Supervision or verbal cues Chair/bed transfer assistive device: Armrests, Other     Locomotion Ambulation Ambulation activity did not occur: Safety/medical concerns   Max distance: 120 Assist level: 2 helpers   Wheelchair   Type: Manual Max wheelchair distance: 150 Assist Level: No help, No cues, assistive device, takes more than reasonable amount of time  Cognition Comprehension Comprehension assist level: Understands complex 90% of the time/cues 10% of the time  Expression Expression assist level: Expresses complex 90% of the time/cues < 10% of the time  Social Interaction Social Interaction assist level: Interacts appropriately 90% of the time - Needs monitoring or encouragement for participation or  interaction.  Problem Solving Problem solving assist level: Solves basic 75 - 89% of the time/requires cueing 10 - 24% of the time  Memory Memory assist level: Recognizes or recalls 75 - 89% of the time/requires cueing 10 - 24% of the time   Medical Problem List and Plan: 1. Functional deficits secondary to Cord compression secondary to Osteomyelitis/diskitis T4 and T5 with pathologic fracture, able to move both LEs  -team conference today 2. DVT Prophylaxis/Anticoagulation: continue lovenox 3. Pain Management: Continue oxy IR prn. Continue 12.mcg fentanyl patch,  -ice prn  -activity tolerance improving  -hinged neoprene knee brace RLE has been quite beneficial for support (wants one for his left knee also)  -s/p right knee steroid injection 4. Mood: LCSW to follow for evaluation and support.  5. Neuropsych: This patient is capable of making decisions on his own behalf. 6. Skin/Wound Care: continuelocal care  7. Fluids/Electrolytes/Nutrition: Monitor I/O. ON renal diet with 1200 cc/FR.   -labs with HD 8. ESRD: On HD MWF. Schedule sessions later in afternoons to help with therapy tolerance.   -shuntogram revealed normal flow  9. HTN: Monitor BID. Continue Norvasc.   -pressures improve post HD 10. Anemia of chronic disease:following up with HD (8.1) 11. DM type 2:   use SSI for elevated BS.   -added amaryl for hyperglemica---1mg  daily with improvement  -expected hyperglycemia after joint injection---SSI covg for now 12. OSA: Continue use of CPAP.  13. Constipation:   -continue lax/soft  -  prn sorbitol   LOS (Days) 15 A FACE TO FACE EVALUATION WAS PERFORMED  Seth [name] T 09/01/2015 9:05 AM

## 2015-09-01 NOTE — Progress Notes (Signed)
Occupational Therapy Session Note  Patient Details  Name: Seth [name] MRN: [id number] Date of Birth: Oct 08, 1966  Today's Date: 09/01/2015 OT Individual Time: 1130-1200 OT Individual Time Calculation (min): 30 min    Short Term Goals: Week 3:  OT Short Term Goal 1 (Week 3): STG=LTG due to LOS  Skilled Therapeutic Interventions/Progress Updates:  Upon entering the room, pt seated on EOB awaiting therapist this session. Pt with no c/o pain but fatigued from previous therapies. Pt with questions regarding conference with MD and discharge recommendations. OT answered all questions until pt verbalizing understanding. Pt requesting ambulation this session. Sit <>stand from standard height bed with supervision. Pt ambulating with RW and R knee brace donned with steady assist for safety 80'. Pt with 1 R knee buckle but pt able to self correct with min A. Wheelchair follow for safety with ambulation and pt requiring rest break after 80'. Pt returned to room in same manner and seated on EOB to await lunch tray. Call bell and all needed items within reach upon exiting the room.    Therapy Documentation Precautions:  Precautions Precautions: Fall, Back Precaution Comments: cues to adhere to back precautions Required Braces or Orthoses: Other Brace/Splint Other Brace/Splint: R knee brace Restrictions Weight Bearing Restrictions: No Pain: Pain Assessment Pain Assessment: 0-10 Pain Score: 2  Pain Type: Acute pain Pain Location: Knee Pain Orientation: Right;Left Pain Descriptors / Indicators: Aching;Jabbing Pain Frequency: Intermittent Pain Intervention(s): Medication (See eMAR)  See Function Navigator for Current Functional Status.   Therapy/Group: Individual Therapy  Phineas Semen 09/01/2015, 12:06 PM

## 2015-09-01 NOTE — Progress Notes (Signed)
Occupational Therapy Weekly Progress Note  Patient Details  Name: Seth [name] MRN: [id number] Date of Birth: [birth year]  Beginning of progress report period: August 25, 2015 End of progress report period: September 01, 2015  Today's Date: 09/01/2015 OT Individual Time: 1898-4210 OT Individual Time Calculation (min): 55 min    Patient has met 3 of 3 short term goals.  Pt making excellent progress this week with functional ambulation, functional standing balance/ endurance and standing ADL tasks.   Patient continues to demonstrate the following deficits: abnormal posture, acute pain, cognitive deficits and pain in thoracic spine and therefore will continue to benefit from skilled OT intervention to enhance overall performance with BADL, iADL and Reduce care partner burden.  Patient progressing toward long term goals..  Plan of care revisions: Pt's goals have been upgraded to mod I- supervision overall, with min A bathing. See POC for goal details.   OT Short Term Goals Week 2:  OT Short Term Goal 1 (Week 2): Pt willl complete stand pivot transfer to toilet with min A OT Short Term Goal 1 - Progress (Week 2): Met OT Short Term Goal 2 (Week 2): Pt will don pants sit <> stand with min A OT Short Term Goal 2 - Progress (Week 2): Met OT Short Term Goal 3 (Week 2): Pt will complete toileting task with max A OT Short Term Goal 3 - Progress (Week 2): Met Week 3:  OT Short Term Goal 1 (Week 3): STG=LTG due to LOS  Skilled Therapeutic Interventions/Progress Updates:    Pt seen for OT therapy session focusingon functional mobility and activity tolerance. Pt sitting EOB upon arrival. Declining bathing and dressing, voicing instead to walk. He walked 120ft from EOB in controlled environment with supervision using RW with standing rest breaks taken throughout.  He self propelledw/c remainder of way to ADL apartment. Pt ambulated throughout kitchen and removed/ replaced items from overhead kitchen  cabinet, crossing lidline and reaching overhead without LOB, using one UE for stabilizing support. Increased seated rest breaks required following extending standing/ ambulation trials.  He then ambulated in hallway with RW, required to weave in/out of cones in simulation of environmental obstacles. Pt noted to have more knee bucking when turning to R when fatigued, with one LOB episode requiring min A to regain balance. Pt left in therapy gym at end of session with hand off to PT. Pt educated throughout session regarding energy conservation, RW management, and d/c planning.   Therapy Documentation Precautions:  Precautions Precautions: Fall, Back Precaution Comments: cues to adhere to back precautions Required Braces or Orthoses: Other Brace/Splint Other Brace/Splint: R knee brace Restrictions Weight Bearing Restrictions: No Pain: Pain Assessment Pain Assessment: 0-10 Pain Score: 2  Pain Type: Acute pain Pain Location: Knee Pain Orientation: Right;Left Pain Descriptors / Indicators: Aching;Jabbing Pain Frequency: Intermittent Pain Intervention(s): Ambulation/ increased activity; repositioned  See Function Navigator for Current Functional Status.   Therapy/Group: Individual Therapy  Lewis, Lilleigh Hechavarria C 09/01/2015, 7:12 AM

## 2015-09-01 NOTE — Progress Notes (Signed)
Subjective: Interval History: has no complaint.  Objective: Vital signs in last 24 hours: Temp:  [97.6 F (36.4 C)] 97.6 F (36.4 C) (11/22 0622) Pulse Rate:  [79] 79 (11/22 0622) Resp:  [18] 18 (11/22 0622) BP: (134)/(68) 134/68 mmHg (11/22 0622) SpO2:  [100 %] 100 % (11/22 0622) Weight:  [130.9 kg (288 lb 9.3 oz)] 130.9 kg (288 lb 9.3 oz) (11/22 0622) Weight change: -5.9 kg (-13 lb 0.1 oz)  Intake/Output from previous day: 11/21 0701 - 11/22 0700 In: 940 [P.O.:940] Out: -  Intake/Output this shift:    General appearance: alert, cooperative, no distress and morbidly obese Resp: diminished breath sounds bilaterally Cardio: S1, S2 normal GI: massive, pos bs, soft Extremities: AVF RFA  Lab Results:  Recent Labs  08/30/15 1130  WBC 10.9*  HGB 8.6*  HCT 27.9*  PLT 416*   BMET:  Recent Labs  08/30/15 1129  NA 132*  K 4.6  CL 95*  CO2 24  GLUCOSE 145*  BUN 55*  CREATININE 9.53*  CALCIUM 8.9   No results for input(s): PTH in the last 72 hours. Iron Studies: No results for input(s): IRON, TIBC, TRANSFERRIN, FERRITIN in the last 72 hours.  Studies/Results: No results found.  I have reviewed the patient's current medications.  Assessment/Plan: 1 ESRD for HD 2 HTN lower vol 3 Morbid obesity 4 Osteo of spine rehab 5 Anemia esa/fe 6 OSA 7 HPTH meds P HD, esa, rehab.    LOS: 15 days   Seth [name] L 09/01/2015,12:09 PM

## 2015-09-01 NOTE — Progress Notes (Signed)
Physical Therapy Weekly Progress Note  Patient Details  Name: Seth [name] MRN: [id number] Date of Birth: 02-24-1966  Beginning of progress report period: August 27, 2015 End of progress report period: September 01, 2015  Today's Date: 09/01/2015   Session #1: PT Individual Time: 0930-1030 PT Individual Time Calculation (min): 60 min  Premedicated for pain in knees and back. Session focused on simulated car transfer with RW, stair negotiation training for home entry simulation, seated strengthening activity on Kinetron (25 cm/sec resistance) x 10 min, w/c propulsion for UE strengthening and endurance at mod I, and gait training with RW x 175' with min assist (3 standing rest breaks) with cues for posture. Pt with improvements in transfers, endurance, and overall mobility this session. Overall S to steady assist for sit to stands with RW and min assist for stairs. Rest breaks needed throughout. Long term goals uprgraded.     Session #2: PT Individual Time: 1300-1400 (60 min) Co-treatment with TR. Premedicated for back and knee pain. Session focused on functional transfers, w/c mobility for endurance to therapy gym, and dynamic standing balance without UE support. Dynamic standing balance addressed while playing Wiii Dance with various lengths of songs to increase standing tolerance with 1 or no UE support and supervision to steady assist for balance. Pt preoccupied with personal/legal issues that pt just became aware of. Returned to room and transferred to EOB with supervision.    Patient has met 4 of 5 short term goals. Pt is making steady functional progress with mobility. Knee pain (R > L) has improved with use of hinged knee brace during all mobility. Pt currently can transfer squat pivot from w/c with supervision and is min assist for gait/standing activities. Pt's main limitation is still fluctuating levels of fatigue, especially with his HD schedule, in which pt requires increased  assist by later afternoons and is limited with standing/gait activities. Have discussed home set-up (still awaiting measurements) and recommendation for at least a second rail on stairs. Pt is progressing on the stairs when he is able to use bilateral rails for support.   Patient continues to demonstrate the following deficits: decreased strength, decreased endurance, decreased balance, decreased functional mobility, pain, and therefore will continue to benefit from skilled PT intervention to enhance overall performance with activity tolerance, balance, ability to compensate for deficits, functional use of  right lower extremity, attention, coordination and knowledge of precautions.  Patient progressing toward long term goals..  Plan of care revisions: goals uprgraded due to progress..  PT Short Term Goals Week 2:  PT Short Term Goal 1 (Week 2): Pt will perform dynamic standing balance activities with consistent min A and improved knee control with brace PT Short Term Goal 1 - Progress (Week 2): Met PT Short Term Goal 2 (Week 2): Pt will perform basic and car transfers with consistent min A  PT Short Term Goal 2 - Progress (Week 2): Met PT Short Term Goal 3 (Week 2): Pt will perform w/c mobility x 150' in controlled and simulated home environment 150' Mod I PT Short Term Goal 3 - Progress (Week 2): Met PT Short Term Goal 4 (Week 2): Pt will perform ambulation x 50' in controlled environment with LRAD and min A of one person PT Short Term Goal 4 - Progress (Week 2): Met PT Short Term Goal 5 (Week 2): Pt will negotiate 4 stairs with L rail with mod A of one person PT Short Term Goal 5 - Progress (Week 2): Not met (  Pt requires use of Bilateral rails at this time and max assist) Week 3:  PT Short Term Goal 1 (Week 3): = LTGs due to d/c planned 11/30  Skilled Therapeutic Interventions/Progress Updates:  Ambulation/gait training;Balance/vestibular training;Community reintegration;Discharge  planning;Disease management/prevention;DME/adaptive equipment instruction;Functional mobility training;Neuromuscular re-education;Pain management;Patient/family education;Psychosocial support;Skin care/wound management;Splinting/orthotics;Stair training;Therapeutic Activities;Therapeutic Exercise;UE/LE Strength taining/ROM;UE/LE Coordination activities;Wheelchair propulsion/positioning   Therapy Documentation Precautions:  Precautions Precautions: Fall, Back Precaution Comments: cues to adhere to back precautions Required Braces or Orthoses: Other Brace/Splint Other Brace/Splint: R knee brace Restrictions Weight Bearing Restrictions: No   See Function Navigator for Current Functional Status.  Therapy/Group: Individual Therapy  Canary Brim Ivory Broad, PT, DPT  09/01/2015, 2:46 PM

## 2015-09-01 NOTE — Procedures (Signed)
I was present at this session.  I have reviewed the session itself and made appropriate changes.  HD via RLA AVG bp tol vol off.   Seth [name] L 11/22/20164:43 PM

## 2015-09-01 NOTE — Progress Notes (Signed)
Recreational Therapy Session Note  Patient Details  Name: Seth [name] MRN: [id number] Date of Birth: 10/03/1966 Today's Date: 09/01/2015  Pain: premedicated for back & knee pain Skilled Therapeutic Interventions/Progress Updates: session focused on dynamic standing balance with and without UE support and standing tolerance.  Pt propelled w/c to gym using BUE's with supervision.  Pt stood to play Wii "just dance" with 1 UE or no UE support with close supervision-min assist.  Therapy/Group: Co-Treatment  Meygan Kyser 09/01/2015, 4:32 PM

## 2015-09-01 NOTE — Progress Notes (Signed)
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 ID saw- plan Cefazolin for 6 weeks with HD. Needs TTE to be sure no notable vegetation. Echo done 11/6- poor study d/t habitus, pt moving. No vegetation noted on results. HD off schedule this week d/t holiday, no dose charted for Sunday after HD, but per pt, he saw the dose was given at the end of HD. Will have HD again today.  Afeb, WBC 10.9  9/24: BCx: Staph lugdunensis (pan sens) 11/1 blood: neg 11/1: op cx: neg  Ancef 11/2 >>  (09/24/15)  Plan - Sheduled one time ancef 2g with HD on 10/22 - Ancef 2 gms post HD on days dialysis received - Continue f/u HD schedule/plans around holiday time and schedule dose appropriately

## 2015-09-01 NOTE — Patient Care Conference (Signed)
Inpatient RehabilitationTeam Conference and Plan of Care Update Date: 09/01/2015   Time: 2:05 PM    Patient Name: Seth [name]      Medical Record Number: [id number]  Date of Birth: Oct 27, 1965 Sex: Male         Room/Bed: 4W24C/4W24C-01 Payor Info: Payor: MEDICARE / Plan: MEDICARE PART A AND B / Product Type: *No Product type* /    Admitting Diagnosis: T4-5 cord compression with osteomyelitis diskitis   Admit Date/Time:  08/17/2015  5:57 PM Admission Comments: No comment available   Primary Diagnosis:  Cord compression myelopathy ([name]) Principal Problem: Cord compression myelopathy [facility])  Patient Active Problem List   Diagnosis Date Noted  . Osteoarthritis of right knee 09/01/2015  . Primary osteoarthritis of right knee   . Cord compression myelopathy ([place]) 08/17/2015  . Osteomyelitis of thoracic spine ([place]) 08/16/2015  . Discitis of thoracic region   . Septic shock ([place]) 08/13/2015  . Acute respiratory failure with hypoxia ([place]) 08/13/2015  . Cord compression ([place]) 08/11/2015  . Back pain 08/11/2015  . Thoracic spine pain 08/11/2015  . Muscle strain of chest wall 07/21/2015  . Controlled type 2 diabetes mellitus with chronic kidney disease on chronic dialysis ([place]) 07/20/2015  . 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 Absolute anemia   . 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 OSA (obstructive sleep apnea) 10/14/2013  . Tracheostomy status ([place]) 09/17/2013  . Difficult airway for intubation 08/20/2013  . End stage renal disease on dialysis ([place]) 04/30/2012  . Hypocalcemia 04/30/2012  . Normocytic anemia 04/30/2012  . DM II (diabetes mellitus, type II), controlled ([place]) 06/20/2007  . DYSLIPIDEMIA 06/20/2007  . Morbid obesity ([place]) 06/20/2007  . Essential hypertension 06/20/2007    Expected Discharge Date: Expected Discharge Date: 09/09/15  Team Members Present: Physician leading conference: Dr. Alger Simons Social Worker Present: Lennart Pall,  LCSW Nurse Present: Heather Roberts, RN PT Present: Canary Brim, PT OT Present: Napoleon Form, OT SLP Present: Weston Anna, SLP PPS Coordinator present : Daiva Nakayama, RN, CRRN     Current Status/Progress Goal Weekly Team Focus  Medical   right knee injected, brace---both helping pain. strength and balance improed  prepare medically for dc  renal, bp, dm mgt, pain control   Bowel/Bladder   continent B&B HD  M W F oliguric . LBM 11-21  Pt to remain continent of bowel and bladder  continue with plan of care    Swallow/Nutrition/ Hydration             ADL's   Min A- supervision sit <> stand and functional transfers. Steadying assist LB dressing.  Supervision overall; Min A bathing  Standing tolerance and endurance; functional transfers;    Mobility   S squat pivot and min to mod assist with RW; min assist gait (moving away from needing w/c follow); max A stairs  S to min A overall; upgraded goals for ambulation short distances and added stair goal for home entry (mod A)  strengthening, endurance, sit to stands, balance, stairs, gait   Communication             Safety/Cognition/ Behavioral Observations  n/a         Pain   fentanyl patch 12.5 mcg , oxycodone 5-10mg  po every 4 hours   less than 4  monitor effectiveness of pain medications and educate on pain management    Skin   back incision with skin glue intact , shearing noted on  right inner buttocks ,  MASD   no new breakdown   educate on skin care and incision care with patient and family     Rehab Goals Patient on target to meet rehab goals: Yes *See Care Plan and progress notes for long and short-term goals.  Barriers to Discharge: pain, size,    Possible Resolutions to Barriers:  orthotics, pain control, adaptive equipment and techniques    Discharge Planning/Teaching Needs:  Home with family to provide up to 24/7 assistance.  Ed sessions to be scheduled.   Team Discussion:  Continues to make good progress;   Pt feels  more confident with brace bil knees.  Upgrading most goals to mod independent.  No concerns.  On target for d/c next week.  Revisions to Treatment Plan:  Upgraded tx goals   Continued Need for Acute Rehabilitation Level of Care: The patient requires daily medical management by a physician with specialized training in physical medicine and rehabilitation for the following conditions: Daily direction of a multidisciplinary physical rehabilitation program to ensure safe treatment while eliciting the highest outcome that is of practical value to the patient.: Yes Daily medical management of patient stability for increased activity during participation in an intensive rehabilitation regime.: Yes Daily analysis of laboratory values and/or radiology reports with any subsequent need for medication adjustment of medical intervention for : Neurological problems;Other  Seth [name] 09/02/2015, 4:39 PM

## 2015-09-01 NOTE — Progress Notes (Signed)
Physical Therapy Session Note  Patient Details  Name: Seth [name] MRN: [id number] Date of Birth: 13-Feb-1966  Today's Date: 09/01/2015 PT Individual Time: [id number] PT Individual Time Calculation (min): 30 min   Short Term Goals: Week 3:  PT Short Term Goal 1 (Week 3): = LTGs due to d/c planned 11/30     Skilled Therapeutic Interventions/Progress Updates:  Pt reported that his R knee was sore from ambulation, rated 10/10.  Pt premedicated this AM, declined meds now.  Pt performed in sitting- 2 x 10 R/L ankle circles, hip flexion, long arc quad knee ext with 5 second hold at end range, bil hip adduction against resistance. Sit><stand with supervision, using R bedrail.   In standing--10 x 2  calf raises, alternating L/R toe raises. Self stretching x 30 seconds x 2 L/R hamstrings/heel cords in sitting with hip flexion and ankle DF, and avoiding trunk flexion due to back precautions. Pt reported that bil LEs felt better after self stretching. PT provided pt with ice pack to place on R knee.  Pt left resting EOB with all needs within reach..    Therapy Documentation Precautions:  Precautions Precautions: Fall, Back Precaution Comments: cues to adhere to back precautions Required Braces or Orthoses: Other Brace/Splint Other Brace/Splint: R knee brace Restrictions Weight Bearing Restrictions: No    See Function Navigator for Current Functional Status.   Therapy/Group: Individual Therapy  Janea Schwenn 09/01/2015, 4:33 PM

## 2015-09-01 NOTE — Plan of Care (Signed)
LTG's upgraded due to progress. Mod I for transfers, Supervision for gait, and min assist stairs. See Care Plan for details.  Lars Masson, PT, DPT

## 2015-09-02 ENCOUNTER — Inpatient Hospital Stay (HOSPITAL_COMMUNITY): Payer: Medicare Other

## 2015-09-02 ENCOUNTER — Inpatient Hospital Stay (HOSPITAL_COMMUNITY): Payer: Non-veteran care | Admitting: Physical Therapy

## 2015-09-02 ENCOUNTER — Inpatient Hospital Stay (HOSPITAL_COMMUNITY): Payer: Medicare Other | Admitting: Physical Therapy

## 2015-09-02 ENCOUNTER — Inpatient Hospital Stay (HOSPITAL_COMMUNITY): Payer: Medicare Other | Admitting: Occupational Therapy

## 2015-09-02 LAB — GLUCOSE, CAPILLARY
GLUCOSE-CAPILLARY: 163 mg/dL — AB (ref 65–99)
Glucose-Capillary: 171 mg/dL — ABNORMAL HIGH (ref 65–99)
Glucose-Capillary: 73 mg/dL (ref 65–99)
Glucose-Capillary: 185 mg/dL — ABNORMAL HIGH (ref 65–99)

## 2015-09-02 NOTE — Progress Notes (Signed)
Physical Therapy Session Note  Patient Details  Name: Seth [name] MRN: [id number] Date of Birth: Sep 23, 1966  Today's Date: 09/02/2015 PT Individual Time: 1115-1145 PT Individual Time Calculation (min): 30 min   Short Term Goals: Week 3:  PT Short Term Goal 1 (Week 3): = LTGs due to d/c planned 11/30  Skilled Therapeutic Interventions/Progress Updates:    Pt received seated in w/c with c/o R knee pain as below and agreeable to treatment. W/c propulsion x150' with BUEs for UE strengthening and aerobic endurance. Standing LE strengthening exercises in parallel bars including hip flexion marching, hip abduction, heel rises, hamstring curls. 2 sets 15 reps each, and occasional rest breaks due to fatigue. W/c propulsion with BUEs to return to room. Remained seated in w/c with all needs in reach at completion of session.   Therapy Documentation Precautions:  Precautions Precautions: Fall, Back Precaution Comments: cues to adhere to back precautions Required Braces or Orthoses: Other Brace/Splint Other Brace/Splint: R knee brace Restrictions Weight Bearing Restrictions: No Pain: Pain Assessment Pain Assessment: 0-10 Pain Score: 7  Pain Type: Acute pain Pain Location: Knee Pain Orientation: Right Pain Descriptors / Indicators: Aching Pain Onset: On-going Patients Stated Pain Goal: 4 Pain Intervention(s): Emotional support;Distraction Multiple Pain Sites: No   See Function Navigator for Current Functional Status.   Therapy/Group: Individual Therapy  Luberta Mutter 09/02/2015, 12:05 PM

## 2015-09-02 NOTE — Progress Notes (Signed)
Occupational Therapy Session Note  Patient Details  Name: Seth [name] MRN: [id number] Date of Birth: 04-15-1966  Today's Date: 09/02/2015 OT Individual Time: 1000-1100 OT Individual Time Calculation (min): 60 min    Short Term Goals: Week 3:  OT Short Term Goal 1 (Week 3): STG=LTG due to LOS  Skilled Therapeutic Interventions/Progress Updates:    Pt resting in w/c upon arrival.  Pt stated he had already washed up and changed clothing.  Pt initially engaged in w/c mobility in cluttered envirionment.  Pt transitioned to BUE therex on [employer] (Random at workload 5 for 10 mins each direction). Pt propelled w/c back to room.  Pt requires more than a reasonable amount of time to propel w/c with multiple rest breaks.  Focus on activity tolerance, BUE strengthining, w/c mobility, and safety awareness to increased independence with BADLs.  Therapy Documentation Precautions:  Precautions Precautions: Fall, Back Precaution Comments: cues to adhere to back precautions Required Braces or Orthoses: Other Brace/Splint Other Brace/Splint: R knee brace Restrictions Weight Bearing Restrictions: No  Pain: Pain Assessment Pain Assessment: 0-10 Pain Score: 8  Pain Type: Acute pain Pain Location: Knee Pain Orientation: Right Pain Descriptors / Indicators: Aching Pain Onset: On-going Pain Intervention(s): Rest;Emotional support  See Function Navigator for Current Functional Status.   Therapy/Group: Individual Therapy  Leroy Libman 09/02/2015, 11:13 AM

## 2015-09-02 NOTE — Progress Notes (Signed)
Occupational Therapy Session Note  Patient Details  Name: Seth [name] MRN: [id number] Date of Birth: May 14, 1966  Today's Date: 09/02/2015 OT Individual Time: 0830-0900 OT Individual Time Calculation (min): 30 min    Short Term Goals: Week 1:  OT Short Term Goal 1 (Week 1): Pt will complete toileting task with max A +1 OT Short Term Goal 1 - Progress (Week 1): Met OT Short Term Goal 2 (Week 1): Pt will demonstrate carry over of education of spinal pre-cautions with min VCs OT Short Term Goal 2 - Progress (Week 1): Met OT Short Term Goal 3 (Week 1): Pt will complete sit <> stand at sink with max A +1 OT Short Term Goal 3 - Progress (Week 1): Met OT Short Term Goal 4 (Week 1): Pt will transfer to [place] with +1 assist OT Short Term Goal 4 - Progress (Week 1): Updated due to goal met (Pt no longer requiring use of mechanical lift for transfers) Week 2:  OT Short Term Goal 1 (Week 2): Pt willl complete stand pivot transfer to toilet with min A OT Short Term Goal 1 - Progress (Week 2): Met OT Short Term Goal 2 (Week 2): Pt will don pants sit <> stand with min A OT Short Term Goal 2 - Progress (Week 2): Met OT Short Term Goal 3 (Week 2): Pt will complete toileting task with max A OT Short Term Goal 3 - Progress (Week 2): Met Week 3:  OT Short Term Goal 1 (Week 3): STG=LTG due to LOS  Skilled Therapeutic Interventions/Progress Updates:    Pt seen this session for skilled OT to facilitate ADL skills and functional mobility training. Pt was able to use his RW to ambulate in room to sink and completed all of his self care with Supervision using AE as needed. Pt demonstrated good activity tolerance and good standing balance with use of the RW. Pt's PT arrived for the next session.   Therapy Documentation Precautions:  Precautions Precautions: Fall, Back Precaution Comments: cues to adhere to back precautions Required Braces or Orthoses: Other Brace/Splint Other Brace/Splint:  R knee brace Restrictions Weight Bearing Restrictions: No    Pain: Pain Assessment Pain Assessment: 0-10 Pain Score: 8  Pain Type: Acute pain Pain Location: Knee Pain Orientation: Right Pain Descriptors / Indicators: Aching Pain Onset: On-going Pain Intervention(s): Rest;Emotional support ADL:  See Function Navigator for Current Functional Status.   Therapy/Group: Individual Therapy  Hiren Peplinski 09/02/2015, 9:59 AM

## 2015-09-02 NOTE — Progress Notes (Signed)
Subjective: Interval History: has no complaint , feels good.  Objective: Vital signs in last 24 hours: Temp:  [98.1 F (36.7 C)-98.6 F (37 C)] 98.2 F (36.8 C) (11/23 0520) Pulse Rate:  [80-89] 86 (11/23 0520) Resp:  [16-18] 18 (11/23 0520) BP: (143-177)/(77-102) 161/84 mmHg (11/23 0520) SpO2:  [98 %-100 %] 100 % (11/23 0520) Weight:  [137.5 kg (303 lb 2.1 oz)-138.5 kg (305 lb 5.4 oz)] 137.5 kg (303 lb 2.1 oz) (11/23 0520) Weight change: 7.6 kg (16 lb 12.1 oz)  Intake/Output from previous day: 11/22 0701 - 11/23 0700 In: 240 [P.O.:240] Out: 3500  Intake/Output this shift:    General appearance: alert, cooperative and morbidly obese Resp: diminished breath sounds bilaterally Cardio: S1, S2 normal and systolic murmur: holosystolic 2/6, blowing at apex GI: massive, pos bs, soft Extremities: avf RLA B&T  Lab Results:  Recent Labs  08/30/15 1130 09/01/15 1645  WBC 10.9* 16.7*  HGB 8.6* 8.7*  HCT 27.9* 27.1*  PLT 416* 376   BMET:  Recent Labs  08/30/15 1129 09/01/15 1645  NA 132* 133*  K 4.6 4.8  CL 95* 95*  CO2 24 24  GLUCOSE 145* 195*  BUN 55* 70*  CREATININE 9.53* 10.52*  CALCIUM 8.9 9.2   No results for input(s): PTH in the last 72 hours. Iron Studies: No results for input(s): IRON, TIBC, TRANSFERRIN, FERRITIN in the last 72 hours.  Studies/Results: No results found.  I have reviewed the patient's current medications.  Assessment/Plan: 1 ESRD did well on Hd 2 HTN meds voloff 3 Obesity 4 DM controlled 5 Osteo of spine  6 Anemia ESA, Fe 7 HPHT meds P HD on Fri, bp meds, control DM    LOS: 16 days   Johanny Segers L 09/02/2015,11:21 AM

## 2015-09-02 NOTE — Progress Notes (Signed)
Physical Therapy Session Note  Patient Details  Name: LARK SZETO MRN: [id number] Date of Birth: 1965-11-14  Today's Date: 09/02/2015 PT Individual Time: 0900-1000 PT Individual Time Calculation (min): 60 min   Short Term Goals: Week 3:  PT Short Term Goal 1 (Week 3): = LTGs due to d/c planned 11/30  Skilled Therapeutic Interventions/Progress Updates:   Patient seated edge of bed following OT session, donning socks, shoes and knee braces with setup assist. Patient performed sit > stand from bed with supervision and ambulated out of room using heavy duty RW with min guard. Patient requesting to propel wheelchair to gym using BUE instead of ambulate and stated he was "preoccupied" due to upcoming holiday and missing family. Stair training up/down 4 (6") stairs using 2 rails with step-to pattern and min guard. Gait using heavy duty RW x 110 ft with close supervision. Kinetron from wheelchair level at 10 cm/sec x 10 minutes for BLE  NMR and activity tolerance. Patient propelled wheelchair back to room and left sitting in wheelchair with all needs within reach.   Therapy Documentation Precautions:  Precautions Precautions: Fall, Back Precaution Comments: cues to adhere to back precautions Required Braces or Orthoses: Other Brace/Splint Other Brace/Splint: R knee brace Restrictions Weight Bearing Restrictions: No Pain: Pain Assessment Pain Assessment: 0-10 Pain Score: 8  Pain Type: Acute pain Pain Location: Knee Pain Orientation: Right Pain Descriptors / Indicators: Aching Pain Onset: On-going Pain Intervention(s): Rest;Emotional support   See Function Navigator for Current Functional Status.   Therapy/Group: Individual Therapy  Laretta Alstrom 09/02/2015, 9:49 AM

## 2015-09-02 NOTE — Progress Notes (Signed)
Patient stated he would put cpap on when he is ready for bed. RT informed patient to call if assistance is needed.

## 2015-09-02 NOTE — Progress Notes (Signed)
Social Work [name], male   DOB: Feb 22, 1966, 49 y.o.   MRN: [id number]   Met with pt to review team conference.  Pt very pleased with progress he is making and feels he will be ready for d/c on the 11/30.  Very grateful to staff.  Does have questions about how/ if he might be able to get prescriptions earlier than d/c day as he has to get meds through the [place] system - will discuss with PA.  Planning on [facility] f/u to start.  Continue to follow.  Burak Zerbe, LCSW

## 2015-09-02 NOTE — Progress Notes (Signed)
Physical Therapy Session Note  Patient Details  Name: Seth [name] MRN: [id number] Date of Birth: 1966-09-21  Today's Date: 09/02/2015 PT Individual Time: [id number] :PT Individual Time Calculation (min): 30 min   Short Term Goals: Week 3:  PT Short Term Goal 1 (Week 3): = LTGs due to d/c planned 11/30  Skilled Therapeutic Interventions/Progress Updates:    Pt received seated in w/c, c/o pain as below and agreeable to treatment. Pt declines ambulation due to increased knee pain. W/c propulsion to gym with BUEs for strengthening and endurance. Kinetron x10 min with BLEs for strengthening. Educated pt in w/c propulsion to reduce skidding with wheel. UE strengthening exercises with 5# weighted bar including chest presses, bicep curls, overhead press. Returned to room with w/c propulsion as above, remained seated in w/c at completion of session all needs in reach.   Therapy Documentation Precautions:  Precautions Precautions: Fall, Back Precaution Comments: cues to adhere to back precautions Required Braces or Orthoses: Other Brace/Splint Other Brace/Splint: R knee brace Restrictions Weight Bearing Restrictions: No  Pain: Pain Assessment Pain Assessment: No/denies pain Pain Score: 6  Pain Type: Acute pain Pain Location: Knee Pain Orientation: Right Pain Descriptors / Indicators: Aching Pain Onset: On-going Patients Stated Pain Goal: 4 Pain Intervention(s): Distraction Multiple Pain Sites: No   See Function Navigator for Current Functional Status.   Therapy/Group: Individual Therapy  Luberta Mutter 09/02/2015, 3:40 PM

## 2015-09-02 NOTE — Progress Notes (Signed)
Physical Therapy Session Note  Patient Details  Name: Seth [name] MRN: [id number] Date of Birth: [birth year]  Today's Date: 09/02/2015 PT Individual Time: 1300-1330 PT Individual Time Calculation (min): 30 min   Short Term Goals: Week 3:  PT Short Term Goal 1 (Week 3): = LTGs due to d/c planned 11/30  Skilled Therapeutic Interventions/Progress Updates:    Pt received resting in w/c and agreeable to therapy session.  Session focus on UE strengthening and endurance and functional transfers.  Pt propelled w/c 200'x2 using BUEs with short rest breaks as needed.  PT instructed patient in car transfer using RW and ambulatory approach with supervision for safety.  Pt returned to room at end of session and positioned in w/c with call bell in reach and needs met.   Therapy Documentation Precautions:  Precautions Precautions: Fall, Back Precaution Comments: cues to adhere to back precautions Required Braces or Orthoses: Other Brace/Splint Other Brace/Splint: R knee brace Restrictions Weight Bearing Restrictions: No Pain: Pain Assessment Pain Assessment: No/denies pain   See Function Navigator for Current Functional Status.   Therapy/Group: Individual Therapy  Earnest Conroy Penven-Crew 09/02/2015, 3:40 PM

## 2015-09-02 NOTE — Progress Notes (Signed)
[place] PHYSICAL MEDICINE & REHABILITATION     PROGRESS NOTE    Subjective/Complaints: HD went a little late last night but no difficulties. Right knee feeling better. Slept well.   ROS: Pt denies fever, rash/itching, headache, blurred or double vision, nausea, vomiting, abdominal pain, diarrhea, chest pain, shortness of breath, palpitations, dysuria, dizziness, bleeding, anxiety, or depression   Objective: Vital Signs: Blood pressure 161/84, pulse 86, temperature 98.2 F (36.8 C), temperature source Oral, resp. rate 18, weight 137.5 kg (303 lb 2.1 oz), SpO2 100 %. No results found.  Recent Labs  08/30/15 1130 09/01/15 1645  WBC 10.9* 16.7*  HGB 8.6* 8.7*  HCT 27.9* 27.1*  PLT 416* 376    Recent Labs  08/30/15 1129 09/01/15 1645  NA 132* 133*  K 4.6 4.8  CL 95* 95*  GLUCOSE 145* 195*  BUN 55* 70*  CREATININE 9.53* 10.52*  CALCIUM 8.9 9.2   CBG (last 3)   Recent Labs  09/01/15 1629 09/01/15 2206 09/02/15 0744  GLUCAP 160* 261* 171*    Wt Readings from Last 3 Encounters:  09/02/15 137.5 kg (303 lb 2.1 oz)  08/17/15 136.8 kg (301 lb 9.4 oz)  08/10/15 139.254 kg (307 lb)    Physical Exam:  Constitutional: He is oriented to person, place, and time. He appears well-developed and well-nourished.  Morbidly obese male lying in bed. NAD.  HENT:  Head: Normocephalic and atraumatic.  Eyes: Conjunctivae and EOM are normal. Pupils are equal, round, and reactive to light.  Neck: Normal range of motion. Neck supple.   Cardiovascular: Normal rate and regular rhythm.  Respiratory: Effort normal and breath sounds normal. No respiratory distress. He has no wheezes.  GI: Soft. Bowel sounds are normal. He exhibits no distension. There is no tenderness.   Musculoskeletal: right knee with pain during PROM. Mild peripatellar swelling. Crepitus with PROM/AROM Neuro: Strength B/l UE 4+/5 throughout LLE hip flexion 4/5, ankle dorsi/plantar flexion 4+/5 RLE: hip flexion  4-/5, ankle dorsi/plantar flexion 4+/5  He is alert and oriented to person, place, and time.  Speech clear.  Able to follow commands without difficulty.  He is alert and oriented to person, place, and time.  He has normal reflexes.  Intentional tremors BUE. Sensation intact to light touchin all 4 limbs  Skin: Skin is warm and dry. Surgical site with honeycomb dressing, drain with minimal drainage.   BLE are improving.   Psychiatric: He has a normal mood and affect. His speech is normal. Thought content normal. Cognition and memory are normal.     Assessment/Plan: 1. Functional deficits secondary to T4 osteomyelitis with cord compression/myelopathy which require 3+ hours per day of interdisciplinary therapy in a comprehensive inpatient rehab setting. Physiatrist is providing close team supervision and 24 hour management of active medical problems listed below. Physiatrist and rehab team continue to assess barriers to discharge/monitor patient progress toward functional and medical goals.  Function:  Bathing Bathing position   Position: Shower  Bathing parts Body parts bathed by patient: Right arm, Left arm, Chest, Abdomen, Front perineal area, Right upper leg, Left upper leg, Right lower leg, Left lower leg, Back Body parts bathed by helper: Buttocks  Bathing assist Assist Level: Supervision or verbal cues      Upper Body Dressing/Undressing Upper body dressing   What is the patient wearing?: Pull over shirt/dress     Pull over shirt/dress - Perfomed by patient: Thread/unthread right sleeve, Thread/unthread left sleeve, Put head through opening, Pull shirt over trunk Pull  over shirt/dress - Perfomed by helper: Pull shirt over trunk        Upper body assist Assist Level: More than reasonable time      Lower Body Dressing/Undressing Lower body dressing   What is the patient wearing?: Socks, Shoes Underwear - Performed by patient: Thread/unthread right underwear leg,  Thread/unthread left underwear leg, Pull underwear up/down Underwear - Performed by helper: Pull underwear up/down Pants- Performed by patient: Thread/unthread right pants leg, Thread/unthread left pants leg, Pull pants up/down Pants- Performed by helper: Thread/unthread right pants leg, Pull pants up/down Non-skid slipper socks- Performed by patient: Don/doff right sock, Don/doff left sock Non-skid slipper socks- Performed by helper: Don/doff right sock, Don/doff left sock   Socks - Performed by helper: Don/doff right sock, Don/doff left sock Shoes - Performed by patient: Don/doff right shoe, Don/doff left shoe Shoes - Performed by helper: Fasten right, Fasten left          Lower body assist Assist for lower body dressing: Supervision or verbal cues      Toileting Toileting Toileting activity did not occur: No continent bowel/bladder event (no bowel, urinal for urine) Toileting steps completed by patient: Performs perineal hygiene Toileting steps completed by helper: Adjust clothing prior to toileting, Performs perineal hygiene, Adjust clothing after toileting Toileting Assistive Devices: Grab bar or rail  Toileting assist Assist level: Two helpers   Transfers Chair/bed transfer   Chair/bed transfer method: Squat pivot Chair/bed transfer assist level: Supervision or verbal cues Chair/bed transfer assistive device: Walker, Other (knee brace)     Locomotion Ambulation Ambulation activity did not occur: Safety/medical concerns   Max distance: 80 Assist level: Touching or steadying assistance (Pt > 75%)   Wheelchair   Type: Manual Max wheelchair distance: 150 Assist Level: No help, No cues, assistive device, takes more than reasonable amount of time  Cognition Comprehension Comprehension assist level: Understands complex 90% of the time/cues 10% of the time  Expression Expression assist level: Expresses complex 90% of the time/cues < 10% of the time  Social Interaction Social  Interaction assist level: Interacts appropriately 90% of the time - Needs monitoring or encouragement for participation or interaction.  Problem Solving Problem solving assist level: Solves basic 75 - 89% of the time/requires cueing 10 - 24% of the time  Memory Memory assist level: Recognizes or recalls 75 - 89% of the time/requires cueing 10 - 24% of the time   Medical Problem List and Plan: 1. 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 DVT Prophylaxis/Anticoagulation: continue lovenox 3. Pain Management: Continue oxy IR prn. Continue 12.mcg fentanyl patch,  -ice prn  -activity tolerance improving  -hinged neoprene knee brace RLE has been quite beneficial for support (wants one for his left knee also)  -s/p right knee steroid injection 4. Mood: LCSW to follow for evaluation and support.  5. Neuropsych: This patient is capable of making decisions on his own behalf. 6. Skin/Wound Care: continuelocal care  7. Fluids/Electrolytes/Nutrition: Monitor I/O. ON renal diet with 1200 cc/FR.   -labs with HD 8. ESRD: On HD MWF. Schedule sessions later in afternoons to help with therapy tolerance.     9. HTN: Monitor BID. Continue Norvasc.   -pressures improve post HD generally---consider b-blocker 10. Anemia of chronic disease:following up with HD (8.1) 11. DM type 2:   use SSI for elevated BS.   -added amaryl for  hyperglemica---1mg  daily  -expect sugars to drop over the next 24-48 hours. SSI covg for now 12. OSA: Continue use of CPAP.  13. Constipation:   -continue lax/soft  -prn sorbitol   LOS (Days) 16 A FACE TO FACE EVALUATION WAS PERFORMED  Leola Fiore T 09/02/2015 9:12 AM

## 2015-09-03 DIAGNOSIS — D72829 Elevated white blood cell count, unspecified: Secondary | ICD-10-CM

## 2015-09-03 LAB — GLUCOSE, CAPILLARY
GLUCOSE-CAPILLARY: 128 mg/dL — AB (ref 65–99)
Glucose-Capillary: 95 mg/dL (ref 65–99)
Glucose-Capillary: 126 mg/dL — ABNORMAL HIGH (ref 65–99)
Glucose-Capillary: 197 mg/dL — ABNORMAL HIGH (ref 65–99)

## 2015-09-03 NOTE — Progress Notes (Signed)
Pt said he was fine sleeping without his CPAP. RN offered to call respiratory, and pt assured RN he was well educated on how to place it if he felt he needed it later in the night.

## 2015-09-03 NOTE — Progress Notes (Signed)
Subjective: Interval History: has no complaint .  Objective: Vital signs in last 24 hours: Temp:  [97.8 F (36.6 C)-98 F (36.7 C)] 97.8 F (36.6 C) (11/24 0523) Pulse Rate:  [74-84] 74 (11/24 0523) Resp:  [16-18] 16 (11/24 0523) BP: (149-158)/(67-76) 158/76 mmHg (11/24 0523) SpO2:  [100 %] 100 % (11/24 0523) Weight:  [132.5 kg (292 lb 1.8 oz)] 132.5 kg (292 lb 1.8 oz) (11/24 0523) Weight change: -6 kg (-13 lb 3.6 oz)  Intake/Output from previous day: 11/23 0701 - 11/24 0700 In: 600 [P.O.:600] Out: -  Intake/Output this shift: Total I/O In: 360 [P.O.:360] Out: -   General appearance: alert, cooperative, no distress and morbidly obese Resp: diminished breath sounds bilaterally Cardio: S1, S2 normal and systolic murmur: holosystolic 2/6, blowing at apex GI: obese, pos bs, soft Extremities: AVF RLA B&T  Lab Results:  Recent Labs  09/01/15 1645  WBC 16.7*  HGB 8.7*  HCT 27.1*  PLT 376   BMET:  Recent Labs  09/01/15 1645  NA 133*  K 4.8  CL 95*  CO2 24  GLUCOSE 195*  BUN 70*  CREATININE 10.52*  CALCIUM 9.2   No results for input(s): PTH in the last 72 hours. Iron Studies: No results for input(s): IRON, TIBC, TRANSFERRIN, FERRITIN in the last 72 hours.  Studies/Results: No results found.  I have reviewed the patient's current medications.  Assessment/Plan: 1 ESRD for HD on Fri . Vol xs 2 anemia on ESA 3 DMcontrolled 4 Morbid obesity 5 Osteo of spine 6 HPTH meds P HD, bp control, lower vol    LOS: 17 days   Hatcher Froning L 09/03/2015,10:21 AM

## 2015-09-03 NOTE — Progress Notes (Signed)
[place] PHYSICAL MEDICINE & REHABILITATION     PROGRESS NOTE    Subjective/Complaints: No new issues. Received second brace. In good spirits  ROS: Pt denies fever, rash/itching, headache, blurred or double vision, nausea, vomiting, abdominal pain, diarrhea, chest pain, shortness of breath, palpitations, dysuria, dizziness, bleeding, anxiety, or depression   Objective: Vital Signs: Blood pressure 158/76, pulse 74, temperature 97.8 F (36.6 C), temperature source Oral, resp. rate 16, weight 132.5 kg (292 lb 1.8 oz), SpO2 100 %. No results found.  Recent Labs  09/01/15 1645  WBC 16.7*  HGB 8.7*  HCT 27.1*  PLT 376    Recent Labs  09/01/15 1645  NA 133*  K 4.8  CL 95*  GLUCOSE 195*  BUN 70*  CREATININE 10.52*  CALCIUM 9.2   CBG (last 3)   Recent Labs  09/02/15 1646 09/02/15 2057 09/03/15 0657  GLUCAP 185* 163* 128*    Wt Readings from Last 3 Encounters:  09/03/15 132.5 kg (292 lb 1.8 oz)  08/17/15 136.8 kg (301 lb 9.4 oz)  08/10/15 139.254 kg (307 lb)    Physical Exam:  Constitutional: He is oriented to person, place, and time. He appears well-developed and well-nourished.  Morbidly obese male lying in bed. NAD.  HENT:  Head: Normocephalic and atraumatic.  Eyes: Conjunctivae and EOM are normal. Pupils are equal, round, and reactive to light.  Neck: Normal range of motion. Neck supple.   Cardiovascular: Normal rate and regular rhythm.  Respiratory: Effort normal and breath sounds normal. No respiratory distress. He has no wheezes.  GI: Soft. Bowel sounds are normal. He exhibits no distension. There is no tenderness.   Musculoskeletal: right knee with pain during PROM. Mild peripatellar swelling. Crepitus with PROM/AROM Neuro: Strength B/l UE 4+/5 throughout LLE hip flexion 4/5, ankle dorsi/plantar flexion 4+/5 RLE: hip flexion 4-/5, ankle dorsi/plantar flexion 4+/5  He is alert and oriented to person, place, and time.  Speech clear.  Able to  follow commands without difficulty.  He is alert and oriented to person, place, and time.  He has normal reflexes.  Intentional tremors BUE. Sensation intact to light touchin all 4 limbs  Skin: Skin is warm and dry. Surgical site with honeycomb dressing, drain with minimal drainage.   BLE are improving.   Psychiatric: He has a normal mood and affect. His speech is normal. Thought content normal. Cognition and memory are normal.     Assessment/Plan: 1. Functional deficits secondary to T4 osteomyelitis with cord compression/myelopathy which require 3+ hours per day of interdisciplinary therapy in a comprehensive inpatient rehab setting. Physiatrist is providing close team supervision and 24 hour management of active medical problems listed below. Physiatrist and rehab team continue to assess barriers to discharge/monitor patient progress toward functional and medical goals.  Function:  Bathing Bathing position   Position: Wheelchair/chair at sink  Bathing parts Body parts bathed by patient: Right arm, Left arm, Chest, Abdomen, Front perineal area, Right upper leg, Left upper leg, Right lower leg, Left lower leg, Back, Buttocks Body parts bathed by helper: Buttocks  Bathing assist Assist Level: Supervision or verbal cues      Upper Body Dressing/Undressing Upper body dressing   What is the patient wearing?: Pull over shirt/dress     Pull over shirt/dress - Perfomed by patient: Thread/unthread right sleeve, Thread/unthread left sleeve, Put head through opening, Pull shirt over trunk Pull over shirt/dress - Perfomed by helper: Pull shirt over trunk        Upper body assist  Assist Level: More than reasonable time      Lower Body Dressing/Undressing Lower body dressing   What is the patient wearing?: Socks, Underwear, Pants Underwear - Performed by patient: Thread/unthread right underwear leg, Thread/unthread left underwear leg, Pull underwear up/down Underwear - Performed by  helper: Pull underwear up/down Pants- Performed by patient: Thread/unthread right pants leg, Thread/unthread left pants leg, Pull pants up/down Pants- Performed by helper: Thread/unthread right pants leg, Pull pants up/down Non-skid slipper socks- Performed by patient: Don/doff right sock, Don/doff left sock Non-skid slipper socks- Performed by helper: Don/doff right sock, Don/doff left sock   Socks - Performed by helper: Don/doff right sock, Don/doff left sock Shoes - Performed by patient: Don/doff right shoe, Don/doff left shoe Shoes - Performed by helper: Fasten right, Fasten left          Lower body assist Assist for lower body dressing: Supervision or verbal cues      Toileting Toileting Toileting activity did not occur: No continent bowel/bladder event (no bowel, urinal for urine) Toileting steps completed by patient: Performs perineal hygiene Toileting steps completed by helper: Adjust clothing prior to toileting, Performs perineal hygiene, Adjust clothing after toileting Toileting Assistive Devices: Grab bar or rail  Toileting assist Assist level: Two helpers   Transfers Chair/bed transfer   Chair/bed transfer method: Squat pivot Chair/bed transfer assist level: Supervision or verbal cues Chair/bed transfer assistive device: Walker, Other (knee brace)     Locomotion Ambulation Ambulation activity did not occur: Safety/medical concerns   Max distance: 110 ft Assist level: Supervision or verbal cues   Wheelchair   Type: Manual Max wheelchair distance: 200 Assist Level: No help, No cues, assistive device, takes more than reasonable amount of time  Cognition Comprehension Comprehension assist level: Understands complex 90% of the time/cues 10% of the time  Expression Expression assist level: Expresses complex 90% of the time/cues < 10% of the time  Social Interaction Social Interaction assist level: Interacts appropriately 90% of the time - Needs monitoring or  encouragement for participation or interaction.  Problem Solving Problem solving assist level: Solves basic 75 - 89% of the time/requires cueing 10 - 24% of the time  Memory Memory assist level: Recognizes or recalls 75 - 89% of the time/requires cueing 10 - 24% of the time   Medical Problem List and Plan: 1. 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 DVT Prophylaxis/Anticoagulation: continue lovenox 3. Pain Management: Continue oxy IR prn. Continue 12.mcg fentanyl patch,  -ice prn  -activity tolerance improving  -hinged neoprene knee brace RLE has been quite beneficial for support. Received second brace also for left  -s/p right knee steroid injection 4. Mood: LCSW to follow for evaluation and support.  5. Neuropsych: This patient is capable of making decisions on his own behalf. 6. Skin/Wound Care: continuelocal care  7. Fluids/Electrolytes/Nutrition: Monitor I/O. ON renal diet with 1200 cc/FR.   -labs with HD 8. ESRD: On HD MWF. Scheduled sessions later in afternoons to help with therapy tolerance.     9. HTN: Monitor BID. Continue Norvasc.   -pressures improve post HD generally---consider b-blocker if further elevation 10. Anemia of chronic disease:following up with HD (8.1) 11. DM type 2:   use SSI for elevated BS.   -added amaryl for hyperglemica---1mg  daily  -sugars have fallen back to baseline---continue current plan 12. OSA: Continue use of CPAP.  13.  Constipation:   -continue lax/soft  -prn sorbitol 14. Leukocytosis: back up to 16k---has ranged from 10-16k over the last 2 weeks  -will d/w renal tomorrow  -no change in plan  -keflex with HD  LOS (Days) 17 A FACE TO FACE EVALUATION WAS PERFORMED  Mahaila Tischer T 09/03/2015 7:46 AM

## 2015-09-03 NOTE — Progress Notes (Signed)
Patient stated he would put cpap on when he is ready for bed. RT filled humidifier with water. RT informed patient to call if assistance is needed.

## 2015-09-04 ENCOUNTER — Inpatient Hospital Stay (HOSPITAL_COMMUNITY): Payer: Medicare Other

## 2015-09-04 ENCOUNTER — Inpatient Hospital Stay (HOSPITAL_COMMUNITY): Payer: Medicare Other | Admitting: Physical Therapy

## 2015-09-04 ENCOUNTER — Inpatient Hospital Stay (HOSPITAL_COMMUNITY): Payer: Non-veteran care | Admitting: Occupational Therapy

## 2015-09-04 LAB — GLUCOSE, CAPILLARY
GLUCOSE-CAPILLARY: 218 mg/dL — AB (ref 65–99)
Glucose-Capillary: 131 mg/dL — ABNORMAL HIGH (ref 65–99)
Glucose-Capillary: 129 mg/dL — ABNORMAL HIGH (ref 65–99)
Glucose-Capillary: 145 mg/dL — ABNORMAL HIGH (ref 65–99)

## 2015-09-04 NOTE — Progress Notes (Signed)
Subjective: Interval History: has no complaint .  Objective: Vital signs in last 24 hours: Temp:  [98 F (36.7 C)-98.2 F (36.8 C)] 98.2 F (36.8 C) (11/25 0500) Pulse Rate:  [73-76] 76 (11/25 0500) Resp:  [16-18] 16 (11/25 0500) BP: (150-154)/(78-82) 154/78 mmHg (11/25 0500) SpO2:  [100 %] 100 % (11/25 0500) Weight:  [140.1 kg (308 lb 13.8 oz)] 140.1 kg (308 lb 13.8 oz) (11/25 0500) Weight change: 7.6 kg (16 lb 12.1 oz)  Intake/Output from previous day: 11/24 0701 - 11/25 0700 In: 840 [P.O.:840] Out: -  Intake/Output this shift: Total I/O In: 360 [P.O.:360] Out: -   General appearance: alert, cooperative and morbidly obese Resp: diminished breath sounds bilaterally Cardio: S1, S2 normal and systolic murmur: holosystolic 2/6, blowing at apex GI: massive,soft Extremities: edema 2+ and AVF RFA  Lab Results:  Recent Labs  09/01/15 1645  WBC 16.7*  HGB 8.7*  HCT 27.1*  PLT 376   BMET:  Recent Labs  09/01/15 1645  NA 133*  K 4.8  CL 95*  CO2 24  GLUCOSE 195*  BUN 70*  CREATININE 10.52*  CALCIUM 9.2   No results for input(s): PTH in the last 72 hours. Iron Studies: No results for input(s): IRON, TIBC, TRANSFERRIN, FERRITIN in the last 72 hours.  Studies/Results: No results found.  I have reviewed the patient's current medications.  Assessment/Plan: 1 ESRD for HD, has vol xs 2 HTN vol xs 3 Anemia ESA 4 HPTH vit D, cinn 5 massive obesity 6 Osteo of spine P HD,lower vol, esa, bp meds    LOS: 18 days   Pier Bosher L 09/04/2015,11:54 AM

## 2015-09-04 NOTE — Progress Notes (Signed)
Occupational Therapy Session Note  Patient Details  Name: Seth [name] MRN: [id number] Date of Birth: 1966/09/27  Today's Date: 09/04/2015 OT Individual Time: 8676-1950 OT Individual Time Calculation (min): 55 min   Short Term Goals: Week 1:  OT Short Term Goal 1 (Week 1): Pt will complete toileting task with max A +1 OT Short Term Goal 1 - Progress (Week 1): Met OT Short Term Goal 2 (Week 1): Pt will demonstrate carry over of education of spinal pre-cautions with min VCs OT Short Term Goal 2 - Progress (Week 1): Met OT Short Term Goal 3 (Week 1): Pt will complete sit <> stand at sink with max A +1 OT Short Term Goal 3 - Progress (Week 1): Met OT Short Term Goal 4 (Week 1): Pt will transfer to [place] with +1 assist OT Short Term Goal 4 - Progress (Week 1): Updated due to goal met (Pt no longer requiring use of mechanical lift for transfers)   Week 2:  OT Short Term Goal 1 (Week 2): Pt willl complete stand pivot transfer to toilet with min A OT Short Term Goal 1 - Progress (Week 2): Met OT Short Term Goal 2 (Week 2): Pt will don pants sit <> stand with min A OT Short Term Goal 2 - Progress (Week 2): Met OT Short Term Goal 3 (Week 2): Pt will complete toileting task with max A OT Short Term Goal 3 - Progress (Week 2): Met   Week 3:  OT Short Term Goal 1 (Week 3): STG=LTG due to LOS  Skilled Therapeutic Interventions/Progress Updates:  Patient received seated in w/c. Pt willing and eager to work with therapist. Pt ambulated from room to therapy gym using RW. Therapist bringing w/c if needed, but patient ambulated entire distance without seated rest break. Pt sat on therapy mat and engaged in BUE therapeutic exercise to focus on increasing overall strength and endurance using 4lb weighted bar. Engaged patient in shoulder flexion,  shoulder press, rowing, and elbow exercises. Pt completed 3 sets of 10 exercises. Pt required verbal cues for correct body mechanics and technique  during exercises. Went over back precautions of no bending, arching, twisting, or lifting more than 5lbs. Therapist assisted patient back to room and left patient seated in w/c with all needs within reach.   Therapy Documentation Precautions:  Precautions Precautions: Fall, Back Precaution Comments: cues to adhere to back precautions Required Braces or Orthoses: Other Brace/Splint Other Brace/Splint: R knee brace Restrictions Weight Bearing Restrictions: No  Vital Signs: Therapy Vitals Temp: 98.2 F (36.8 C) Temp Source: Oral Pulse Rate: 76 Resp: 16 BP: (!) 154/78 mmHg Patient Position (if appropriate): Lying Oxygen Therapy SpO2: 100 % O2 Device: Not Delivered  See Function Navigator for Current Functional Status.  Therapy/Group: Individual Therapy  Vicky Schleich , MS, OTR/L, CLT Pager: [phone]  09/04/2015, 12:06 PM

## 2015-09-04 NOTE — Progress Notes (Signed)
Occupational Therapy Session Note  Patient Details  Name: Seth [name] MRN: [id number] Date of Birth: 02/18/1966  Today's Date: 09/04/2015 OT Individual Time: 0700-0800 OT Individual Time Calculation (min): 60 min    Short Term Goals: Week 3:  OT Short Term Goal 1 (Week 3): STG=LTG due to LOS  Skilled Therapeutic Interventions/Progress Updates:    Pt sitting EOB upon arrival.  Pt engaged in BADL retraining including toilet transfers, toileting, shower transfers, bathing in shower, and dressing with sit<>stand from EOB.  Pt amb with RW and completed all transfers and tasks with supervision in bathroom.  Pt required assistance with tieing shoes.  Pt stood at sink to brush teeth.  Pt did not exhibit any unsafe behaviors.  Pt donned bilateral knee supports without assistance.  Focus on activity tolerance, sit<>stand, standing balance, functional transfers, and safety awareness to increase independence with BADLs. Therapy Documentation Precautions:  Precautions Precautions: Fall, Back Precaution Comments: cues to adhere to back precautions Required Braces or Orthoses: Other Brace/Splint Other Brace/Splint: R knee brace Restrictions Weight Bearing Restrictions: No Pain:  Pt denied pain  See Function Navigator for Current Functional Status.   Therapy/Group: Individual Therapy  Leroy Libman 09/04/2015, 8:00 AM

## 2015-09-04 NOTE — Progress Notes (Signed)
Physical Therapy Session Note  Patient Details  Name: Seth [name] MRN: [id number] Date of Birth: Jun 26, 1966  Today's Date: 09/04/2015 PT Individual Time: 0900-1000 PT Individual Time Calculation (min): 60 min   Short Term Goals: Week 3:  PT Short Term Goal 1 (Week 3): = LTGs due to d/c planned 11/30  Skilled Therapeutic Interventions/Progress Updates:    Gait with RW x202' from room to therapy gym with w/c follow and S, occasional short standing rest breaks <10 seconds for deep breathing. Repetitive sit <>stand from mat table with card matching to address LE strengthening and standing balance with dynamic UE reaching LE strengthening exercises including hip flexion marching, long arc quad, hip adduction isometric, hip abduction with light orange theraband. 1 set 10 reps sit <>stand without UEs, close S and RW available for balance however pt did not need to use it. Squat pivot transfer mat table <>w/c with S. Remained seated in w/c at completion of session with all needs in reach.   Therapy Documentation Precautions:  Precautions Precautions: Fall, Back Precaution Comments: cues to adhere to back precautions Required Braces or Orthoses: Other Brace/Splint Other Brace/Splint: R knee brace Restrictions Weight Bearing Restrictions: No Pain: Pain Assessment Pain Assessment: 0-10 Pain Score: 7  Pain Type: Acute pain Pain Location: Back Pain Orientation: Left Pain Descriptors / Indicators: Aching;Sore Pain Onset: On-going Patients Stated Pain Goal: 4 Pain Intervention(s): Repositioned;Ambulation/increased activity;Distraction Multiple Pain Sites: No   See Function Navigator for Current Functional Status.   Therapy/Group: Individual Therapy  Luberta Mutter 09/04/2015, 9:51 AM

## 2015-09-04 NOTE — Progress Notes (Signed)
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 ID saw- plan Cefazolin for 6 weeks with HD. Echo done 11/6- poor study, No vegetation noted on results. Afeb, WBC 10.9 > 16.7  HD off schedule this week d/t holiday, no dose charted for Sunday 11/20 after HD, but per pt, he saw the dose was given at the end of HD. Received dose Tuesday after HD. Back to normal schedule Today  9/24: BCx: Staph lugdunensis (pan sens) 11/1 blood: neg 11/1: op cx: neg  Ancef 11/2 >>  (09/24/15)  Plan - Continue scheduled Ancef 2g qHD (MWF), to resume normal schedule 11/25 - Note completed 11/25 - f/u on HD schedule & tolerance

## 2015-09-04 NOTE — Progress Notes (Signed)
Social Work [name], male   DOB: [birth year], 49 y.o.   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  Osteoarthritis of right knee  09/01/2015   .  Primary osteoarthritis of right knee     .  Cord compression myelopathy ([place])  08/17/2015   .  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  Discitis of thoracic region     .  Septic shock ([place])  08/13/2015   .  Acute respiratory failure with hypoxia ([place])  08/13/2015   .  Cord compression ([place])  08/11/2015   .  Back pain  08/11/2015   .  Thoracic spine pain  08/11/2015   .  Muscle strain of chest wall  07/21/2015   .  Controlled type 2 diabetes mellitus with chronic kidney disease on chronic dialysis ([place])  07/20/2015   .  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  Absolute anemia     .  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  OSA (obstructive sleep apnea)  10/14/2013   .  Tracheostomy status ([place])  09/17/2013   .  Difficult airway for intubation  08/20/2013   .  End stage renal disease on dialysis ([place])  04/30/2012   .  Hypocalcemia  04/30/2012   .  Normocytic anemia  04/30/2012   .  DM II (diabetes mellitus, type II), controlled ([place])  06/20/2007   .   DYSLIPIDEMIA  06/20/2007   .  Morbid obesity ([place])  06/20/2007   .  Essential hypertension  06/20/2007     Expected Discharge Date: Expected Discharge Date: 09/09/15  Team Members Present: Physician leading conference: Dr. Alger Simons Social Worker Present: Lennart Pall, LCSW Nurse Present: Heather Roberts, RN PT Present: Canary Brim, PT OT Present: Napoleon Form, OT SLP Present: Weston Anna, SLP PPS Coordinator present : Daiva Nakayama, RN, CRRN        Current Status/Progress  Goal  Weekly Team Focus   Medical     right knee injected, brace---both helping pain. strength and balance improed  prepare medically for dc  renal, bp, dm mgt, pain control   Bowel/Bladder     continent B&B HD  M W F oliguric . LBM 11-21   Pt to remain continent of bowel and bladder   continue with plan of care    Swallow/Nutrition/ Hydration               ADL's     Min A- supervision sit <> stand and functional transfers. Steadying assist LB dressing.  Supervision overall; Min A bathing  Standing tolerance and endurance; functional transfers;    Mobility     S squat pivot and min to mod assist with RW; min assist gait (moving away from needing w/c follow); max A stairs  S to min A overall; upgraded goals for ambulation short distances and added stair goal for home entry (mod A)  strengthening, endurance, sit to stands, balance, stairs, gait   Communication               Safety/Cognition/ Behavioral Observations    n/a         Pain     fentanyl patch 12.5 mcg , oxycodone 5-10mg  po every 4 hours    less than 4  monitor effectiveness of pain medications and educate on pain management    Skin     back incision with skin glue intact , shearing noted on  right inner buttocks , MASD   no new breakdown   educate on skin care and incision care with patient and family      Rehab Goals Patient on target to meet rehab goals: Yes *See Care Plan and progress notes for long and short-term goals.    Barriers to  Discharge:  pain, size,     Possible Resolutions to Barriers:   orthotics, pain control, adaptive equipment and techniques      Discharge Planning/Teaching Needs:   Home with family to provide up to 24/7 assistance.  Ed sessions to be scheduled.    Team Discussion:    Continues to make good progress;   Pt feels more confident with brace bil knees.  Upgrading most goals to mod independent.  No concerns.  On target for d/c next week.   Revisions to Treatment Plan:    Upgraded tx goals    Continued Need for Acute Rehabilitation Level of Care: The patient requires daily medical management by a physician with specialized training in physical medicine and rehabilitation for the following conditions: Daily direction of a multidisciplinary physical rehabilitation program to ensure safe treatment while eliciting the highest outcome that is of practical value to the patient.: Yes Daily medical management of patient stability for increased activity during participation in an intensive rehabilitation regime.: Yes Daily analysis of laboratory values and/or radiology reports with any subsequent need for medication adjustment of medical intervention for : Neurological problems;Marda Stalker 09/02/2015, 4:39 PM                 Lowella Curb, LCSW Social Worker Signed  Patient Care Conference 08/25/2015  3:41 PM    Expand All Collapse All   Inpatient RehabilitationTeam Conference and Plan of Care Update Date: 08/25/2015   Time: 2:05 PM     Patient Name: Seth [name]       Medical Record Number: [id number]  Date of Birth: June 22, 1966 Sex: Male         Room/Bed: 4W10C/4W10C-01 Payor Info: Payor: MEDICARE / Plan: MEDICARE PART A AND B / Product Type: *No Product type* /    Admitting Diagnosis: 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  Cord compression myelopathy ([place])  08/17/2015   .  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  Discitis of thoracic region     .  Septic shock ([place])  08/13/2015   .  Acute respiratory failure with hypoxia ([place])  08/13/2015   .  Cord compression ([place])  08/11/2015   .  Back pain  08/11/2015   .  Thoracic spine pain  08/11/2015   .  Muscle strain of chest wall  07/21/2015   .  Controlled type 2 diabetes mellitus with chronic kidney disease on chronic dialysis ([place])  07/20/2015   .  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  Absolute anemia     .  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  OSA (obstructive sleep apnea)  10/14/2013   .  Tracheostomy status ([place])  09/17/2013   .  Difficult airway for intubation  08/20/2013   .  End stage renal disease on dialysis ([place])  04/30/2012   .  Hypocalcemia  04/30/2012   .  Normocytic anemia  04/30/2012   .  DM II (diabetes mellitus, type II), controlled ([place])  06/20/2007   .  DYSLIPIDEMIA  06/20/2007   .  Morbid obesity ([place])  06/20/2007   .  Essential hypertension  06/20/2007     Expected Discharge Date: Expected Discharge Date: 09/09/15  Team Members Present: Physician leading conference: Dr. Alger Simons Social Worker Present: Lennart Pall, LCSW Nurse Present: Heather Roberts, RN PT Present: Jorge Mandril, PT OT Present: Willeen Cass, OT SLP Present: Weston Anna, SLP PPS Coordinator present : Daiva Nakayama, RN, CRRN        Current Status/Progress  Goal  Weekly Team Focus   Medical     improved movement. still reluctant  to take pain meds. shunt issues currently  improve strength, improved pain control   shunt patency, right knee pain---sleeve    Bowel/Bladder     continent of bowel and bladder; LBM 08/22/15, Pt. Oliguic HD MWF  Pt to remain continent of bowel and bladder   Monitor for s/s of constipation  r/t pain medication    Swallow/Nutrition/ Hydration               ADL's     mod A stand pivot transfer with RW, LB dressing with min to mod A,   min overall; supervision for toilet transfers   sit to stands, standing tolerance and upright posture, activity tolerance   Mobility     min to mod assist for transfers; min assist +2 for gait short distances (+2 for close w/c follow); +2 assist for stairs    S to min A overall; upgraded goals for ambulation short distances and added stair goal for home entry (mod A)  tranfsers, endurance, pain management, strengthening, sit to stands, balance, stairs, gait,   Communication               Safety/Cognition/ Behavioral Observations              Pain     5-10mg  Oxy IR q 4hr PRN,  <5  assess pain q 4 hr and prn, monitor for nonverbal cues of pain    Skin     Steri's to T4-T5 lami, OTA, visible old blood   No additional skin breakdown  assess skin q shift and prn    Rehab Goals Patient on target to meet rehab goals: Yes *See Care Plan and progress notes for long and short-term goals.    Barriers to Discharge:  pain, renal issues     Possible Resolutions to Barriers:   using pain meds before therapy, continued positive  reinforcement, orthotics     Discharge Planning/Teaching Needs:   Pt reports he will dc home with family and friends providing "any assistance I need." Making gains with tx and appears some goals are being upgraded.  Ed sessions to be scheduled.    Team Discussion:    Doing better the past few days.  Most goals have been upgraded to supervision/min assist gait.  Pt  Continues to be reluctant to take pain meds.  On track for goals and d/c date.   Revisions to Treatment Plan:    Several goals upgraded.    Continued Need for Acute Rehabilitation Level of Care: The patient requires daily medical management by a physician with specialized training in physical medicine and rehabilitation for the following conditions: Daily  direction of a multidisciplinary physical rehabilitation program to ensure safe treatment while eliciting the highest outcome that is of practical value to the patient.: Yes Daily medical management of patient stability for increased activity during participation in an intensive rehabilitation regime.: Yes Daily analysis of laboratory values and/or radiology reports with any subsequent need for medication adjustment of medical intervention for : Neurological problems;Post surgical problems;Other  Suzanna Zahn 08/25/2015, 3:41 PM                 Lowella Curb, LCSW Social Worker Signed  Patient Care Conference 08/18/2015  4:20 PM    Expand All Collapse All   Inpatient RehabilitationTeam Conference and Plan of Care Update Date: 08/18/2015   Time: 2:10 PM     Patient Name: Seth [name]       Medical Record Number: [id number]  Date of Birth: [birth year] Sex: Male         Room/Bed: 4W10C/4W10C-01 Payor Info: Payor: MEDICARE / Plan: MEDICARE PART A AND B / Product Type: *No Product type* /    Admitting Diagnosis: 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  Cord compression myelopathy ([place])  08/17/2015   .  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  Discitis of thoracic region     .  Septic shock ([place])  08/13/2015   .  Acute respiratory failure with hypoxia ([place])  08/13/2015   .  Cord compression ([place])  08/11/2015   .  Back pain  08/11/2015   .  Thoracic spine pain  08/11/2015   .  Muscle strain of chest wall  07/21/2015   .  Controlled type 2 diabetes mellitus with chronic kidney disease on chronic dialysis ([place])  07/20/2015   .  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  Absolute anemia     .   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  OSA (obstructive sleep apnea)  10/14/2013   .  Tracheostomy status ([place])  09/17/2013   .  Difficult airway for intubation  08/20/2013   .  End stage renal disease on dialysis ([place])  04/30/2012   .  Hypocalcemia  04/30/2012   .  Normocytic anemia  04/30/2012   .  DM II (diabetes mellitus, type II), controlled ([place])  06/20/2007   .  DYSLIPIDEMIA  06/20/2007   .  Morbid obesity ([place])  06/20/2007   .  Essential hypertension  06/20/2007     Expected Discharge Date: Expected Discharge Date: 09/09/15  Team Members Present: Physician leading conference: Dr. Alger Simons Social Worker Present: Lennart Pall, LCSW Nurse Present: Heather Roberts, RN PT Present: Canary Brim, PT OT Present: Napoleon Form, OT SLP Present: Weston Anna, SLP PPS Coordinator present : Daiva Nakayama, RN, CRRN        Current Status/Progress  Goal  Weekly Team Focus   Medical     t4 osteomyelitis with myelopathy. ESRD. having difficulty with pain  improve ability to perform self-care   pain mgt, cognitive rx?, wound care    Bowel/Bladder     Continent of bowel and bladder.   Pt to remain continent of bowel and bladder   Monitor   Swallow/Nutrition/ Hydration               ADL's     Max A+2 functional transfers, toileting, LB dressing; min A UB dressing; set-up grooming  min- mod overall  Sit <> stands in prep for toileting task and functional transfers; activity tolerance   Mobility     max assist to total +2  min to mod assist overall w/c level   transfers, endurance, pain management, endurance, strengthening, sit to stands, balance   Communication               Safety/Cognition/ Behavioral Observations              Pain     Tramadol 50mg  scheduled, Oxy IR 10mg  q 4hrs   <5  Monitor for nonverbal cues of pain    Skin     Honey dressing to T4-T5 lamin, OTA, visible old blood, IJ cath to R IJ  No additional skin breakdown  Monitor and assess q shift    Rehab Goals Patient on  target to meet rehab goals: Yes *See Care Plan and progress notes for long and short-term goals.    Barriers to Discharge:  premorbid sedentary living status? pain, cognition?     Possible Resolutions to Barriers:   continued NMR, adaptive equipment training, repeittion, care giver ed     Discharge Planning/Teaching Needs:   Pt reports he will dc home with family and friends providing "any assistance I need." - Still need to confirm  will schedule as caregivers are confirmed    Team Discussion:    MD notes not as impaired as he would expect neurologically.  Having difficult time with therapies, however, and determining best options for moving, transferring, etc.  Initial eval today and needed as much as 3+ assist.  MD to trial a decreased on pain meds.  Tx hope to aim for min assist w/c level goals but likely to alter as clearer picture of abilities emerges.   Revisions to Treatment Plan:    NA    Continued Need for Acute Rehabilitation Level of Care: The patient requires daily medical management by a physician with specialized training in physical medicine and rehabilitation for the following conditions: Daily direction of a multidisciplinary physical rehabilitation program to ensure safe treatment while eliciting the highest outcome that is of practical value to the patient.: Yes Daily medical management of patient stability for increased activity during participation in an intensive rehabilitation regime.: Yes Daily analysis of laboratory values and/or radiology reports with any subsequent need for medication adjustment of medical intervention for : Neurological problems;Post surgical problems;Other  Mccayla Shimada, [place] 08/18/2015, 4:55 PM

## 2015-09-04 NOTE — Progress Notes (Signed)
Physical Therapy Session Note  Patient Details  Name: Seth [name] MRN: [id number] Date of Birth: 1966/07/05  Today's Date: 09/04/2015 PT Individual Time: 1330-1430 PT Individual Time Calculation (min): 60 min   Short Term Goals: Week 3:  PT Short Term Goal 1 (Week 3): = LTGs due to d/c planned 11/30  Skilled Therapeutic Interventions/Progress Updates:    Patient in w/c discussed progress in therapy and plans after d/c with young son present.  Stood from w/c supervision and ambulated to therapy gym with RW supervision and w/c following due to pt reports fatigued from prior therapy sessions.  Patient seated in w/c for kinetron 2 x 5 min at 60 cm/sec for LE strengthening.  Patient seated in w/c for stretch to bilat hamstrings 2 x 30 sec hold.  Patient performed standing balance activities without UE support including ball toss, bounce catch, dribbling ball all with close supervision.  Patient fatigued after activities and asked if son could push in w/c back to room.  Accompanied pt back to room and left with son in room and all needs in reach.  Therapy Documentation Precautions:  Precautions Precautions: Fall, Back Precaution Comments: cues to adhere to back precautions Required Braces or Orthoses: Other Brace/Splint Other Brace/Splint: R knee brace Restrictions Weight Bearing Restrictions: No Vital Signs: Therapy Vitals Temp: 97.5 F (36.4 C) Temp Source: Oral Pulse Rate: 88 Resp: 20 BP: (!) 183/78 mmHg (Pt just came from bathroom on walker) Patient Position (if appropriate): Sitting Oxygen Therapy SpO2: 100 % O2 Device: Not Delivered Pain: Pain Assessment Faces Pain Scale: Hurts even more Pain Type: Acute pain Pain Location: Back Pain Descriptors / Indicators: Sore Pain Onset: With Activity Pain Intervention(s): Rest   See Function Navigator for Current Functional Status.   Therapy/Group: Individual Therapy  [place],  [place] 09/04/2015  09/04/2015, 4:21 PM

## 2015-09-04 NOTE — Progress Notes (Signed)
Patient stated he would put the cpap on when he is ready to go to bed. RT informed patient to have nurse call RT if any assistance is needed.

## 2015-09-04 NOTE — Progress Notes (Signed)
[place] PHYSICAL MEDICINE & REHABILITATION     PROGRESS NOTE    Subjective/Complaints: No new complaints  ROS: Pt denies fever, rash/itching, headache, blurred or double vision, nausea, vomiting, abdominal pain, diarrhea, chest pain, shortness of breath, palpitations, dysuria, dizziness, bleeding, anxiety, or depression   Objective: Vital Signs: Blood pressure 154/78, pulse 76, temperature 98.2 F (36.8 C), temperature source Oral, resp. rate 16, weight 140.1 kg (308 lb 13.8 oz), SpO2 100 %. No results found.  Recent Labs  09/01/15 1645  WBC 16.7*  HGB 8.7*  HCT 27.1*  PLT 376    Recent Labs  09/01/15 1645  NA 133*  K 4.8  CL 95*  GLUCOSE 195*  BUN 70*  CREATININE 10.52*  CALCIUM 9.2   CBG (last 3)   Recent Labs  09/03/15 1625 09/03/15 2041 09/04/15 0657  GLUCAP 126* 197* 131*    Wt Readings from Last 3 Encounters:  09/04/15 140.1 kg (308 lb 13.8 oz)  08/17/15 136.8 kg (301 lb 9.4 oz)  08/10/15 139.254 kg (307 lb)    Physical Exam:  Constitutional: He is oriented to person, place, and time. He appears well-developed and well-nourished.  Morbidly obese male . nad  HENT:  Head: Normocephalic and atraumatic.  Eyes: Conjunctivae and EOM are normal. Pupils are equal, round, and reactive to light.  Neck: Normal range of motion. Neck supple.   Cardiovascular: Normal rate and regular rhythm.  Respiratory: Effort normal and breath sounds normal. No respiratory distress. He has no wheezes.  GI: Soft. Bowel sounds are normal. He exhibits no distension. There is no tenderness.   Musculoskeletal: right knee with much less pain during AROM/PROM Neuro: Strength B/l UE 4+/5 throughout LLE hip flexion 4/5, ankle dorsi/plantar flexion 4+/5 RLE: hip flexion 4-/5, ankle dorsi/plantar flexion 4+/5  He is alert and oriented to person, place, and time.  Speech clear.  Able to follow commands without difficulty.  He is alert and oriented to person, place, and  time.  He has normal reflexes.  Intentional tremors BUE. Sensation intact to light touchin all 4 limbs  Skin: Skin is warm and dry. Surgical site with honeycomb dressing, drain with minimal drainage.   BLE are improving.   Psychiatric: He has a normal mood and affect. His speech is normal. Thought content normal. Cognition and memory are normal.     Assessment/Plan: 1. Functional deficits secondary to T4 osteomyelitis with cord compression/myelopathy which require 3+ hours per day of interdisciplinary therapy in a comprehensive inpatient rehab setting. Physiatrist is providing close team supervision and 24 hour management of active medical problems listed below. Physiatrist and rehab team continue to assess barriers to discharge/monitor patient progress toward functional and medical goals.  Function:  Bathing Bathing position   Position: Shower  Bathing parts Body parts bathed by patient: Right arm, Left arm, Chest, Abdomen, Front perineal area, Right upper leg, Left upper leg, Right lower leg, Left lower leg, Back, Buttocks Body parts bathed by helper: Buttocks  Bathing assist Assist Level: Supervision or verbal cues      Upper Body Dressing/Undressing Upper body dressing   What is the patient wearing?: Pull over shirt/dress     Pull over shirt/dress - Perfomed by patient: Thread/unthread right sleeve, Thread/unthread left sleeve, Put head through opening, Pull shirt over trunk Pull over shirt/dress - Perfomed by helper: Pull shirt over trunk        Upper body assist Assist Level: No help, No cues      Lower Body Dressing/Undressing  Lower body dressing   What is the patient wearing?: Socks, Underwear, Pants Underwear - Performed by patient: Thread/unthread right underwear leg, Thread/unthread left underwear leg, Pull underwear up/down Underwear - Performed by helper: Pull underwear up/down Pants- Performed by patient: Thread/unthread right pants leg, Thread/unthread  left pants leg, Pull pants up/down Pants- Performed by helper: Thread/unthread right pants leg, Pull pants up/down Non-skid slipper socks- Performed by patient: Don/doff right sock, Don/doff left sock Non-skid slipper socks- Performed by helper: Don/doff right sock, Don/doff left sock   Socks - Performed by helper: Don/doff right sock, Don/doff left sock Shoes - Performed by patient: Don/doff right shoe, Don/doff left shoe Shoes - Performed by helper: Fasten right, Fasten left          Lower body assist Assist for lower body dressing: Touching or steadying assistance (Pt > 75%)      Toileting Toileting Toileting activity did not occur: No continent bowel/bladder event (no bowel, urinal for urine) Toileting steps completed by patient: Adjust clothing prior to toileting, Performs perineal hygiene, Adjust clothing after toileting Toileting steps completed by helper: Adjust clothing prior to toileting, Performs perineal hygiene, Adjust clothing after toileting Toileting Assistive Devices: Grab bar or rail  Toileting assist Assist level: More than reasonable time   Transfers Chair/bed transfer   Chair/bed transfer method: Squat pivot Chair/bed transfer assist level: Supervision or verbal cues Chair/bed transfer assistive device: Walker, Other (knee brace)     Locomotion Ambulation Ambulation activity did not occur: Safety/medical concerns   Max distance: 110 ft Assist level: Supervision or verbal cues   Wheelchair   Type: Manual Max wheelchair distance: 200 Assist Level: No help, No cues, assistive device, takes more than reasonable amount of time  Cognition Comprehension Comprehension assist level: Understands complex 90% of the time/cues 10% of the time  Expression Expression assist level: Expresses complex 90% of the time/cues < 10% of the time  Social Interaction Social Interaction assist level: Interacts appropriately 90% of the time - Needs monitoring or encouragement for  participation or interaction.  Problem Solving Problem solving assist level: Solves basic 75 - 89% of the time/requires cueing 10 - 24% of the time  Memory Memory assist level: Recognizes or recalls 75 - 89% of the time/requires cueing 10 - 24% of the time   Medical Problem List and Plan: 1. 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 DVT Prophylaxis/Anticoagulation: continue lovenox 3. Pain Management: Continue oxy IR prn. Continue 12.mcg fentanyl patch,  -ice prn  -activity tolerance improving  -hinged neoprene knee brace RLE has been quite beneficial for support. Received second brace also for left  -s/p right knee steroid injection 4. Mood: LCSW to follow for evaluation and support.  5. Neuropsych: This patient is capable of making decisions on his own behalf. 6. Skin/Wound Care: continuelocal care  7. Fluids/Electrolytes/Nutrition: Monitor I/O. ON renal diet with 1200 cc/FR.   -labs with HD 8. ESRD: On HD MWF. Scheduled sessions later in afternoons to help with therapy tolerance.     9. HTN: Monitor BID. Continue Norvasc.   -pressures improve post HD generally---consider b-blocker if further elevation 10. Anemia of chronic disease:following up with HD (8.1) 11. DM type 2:   use SSI for elevated BS.   -added amaryl for hyperglemica---1mg  daily  -sugars have fallen back to baseline---continue current plan 12. OSA: Continue use of CPAP.  13.  Constipation:   -continue lax/soft  -prn sorbitol 14. Leukocytosis: back up to 16k most recently---has ranged from 10-16k over the last 2 weeks  -labs today with HD  -no change in plan  -keflex with HD  LOS (Days) 18 A FACE TO FACE EVALUATION WAS PERFORMED  SWARTZ,ZACHARY T 09/04/2015 9:30 AM

## 2015-09-05 ENCOUNTER — Inpatient Hospital Stay (HOSPITAL_COMMUNITY): Payer: Medicare Other | Admitting: *Deleted

## 2015-09-05 ENCOUNTER — Inpatient Hospital Stay (HOSPITAL_COMMUNITY): Payer: Medicare Other | Admitting: Occupational Therapy

## 2015-09-05 LAB — GLUCOSE, CAPILLARY
GLUCOSE-CAPILLARY: 168 mg/dL — AB (ref 65–99)
GLUCOSE-CAPILLARY: 174 mg/dL — AB (ref 65–99)
Glucose-Capillary: 114 mg/dL — ABNORMAL HIGH (ref 65–99)
Glucose-Capillary: 120 mg/dL — ABNORMAL HIGH (ref 65–99)

## 2015-09-05 MED ORDER — CEFAZOLIN SODIUM-DEXTROSE 2-3 GM-% IV SOLR
2.0000 g | Freq: Once | INTRAVENOUS | Status: DC
  Filled 2015-09-05 (×2): qty 50

## 2015-09-05 NOTE — Progress Notes (Signed)
Occupational Therapy Session Note  Patient Details  Name: Seth [name] MRN: [id number] Date of Birth: 05-29-1966  Today's Date: 09/05/2015 OT Individual Time: [id number] and [id number] OT Individual Time Calculation (min): 60 min and 30 min   Short Term Goals: Week 3:  OT Short Term Goal 1 (Week 3): STG=LTG due to LOS  Skilled Therapeutic Interventions/Progress Updates:    1) ADL retraining with pt completing dressing tasks at sit > stand level from EOB.  Pt able to complete LB dressing with supervision with sit > stand when pulling pants over hips and required assist with tying shoes.  Ambulated to therapy gym with RW and supervision.  Engaged in 3 sets of 10 chest presses with 4# medicine ball followed by 3 sets of 10 PNF pattern reaching.  Returned to room via w/c with pt propelling for additional UE strengthening and endurance.  2) Treatment session with focus on standing balance and endurance without UE support and general strengthening.  Engaged in ball toss in standing with beach ball to challenge standing balance without UE support and with light weight, inconsistent ball.  Pt demonstrating good standing tolerance without UE support even with reaching outside BOS with ball toss.  Utilized rebounder in sitting to challenge reaction time and trunk control.  Pt ambulated back to room with RW and supervision.  Therapy Documentation Precautions:  Precautions Precautions: Fall, Back Precaution Comments: cues to adhere to back precautions Required Braces or Orthoses: Other Brace/Splint Other Brace/Splint: R knee brace Restrictions Weight Bearing Restrictions: No General:   Vital Signs: Therapy Vitals Temp: 97.3 F (36.3 C) Temp Source: Oral Pulse Rate: 87 Resp: 18 BP: (!) 147/82 mmHg Patient Position (if appropriate): Lying Oxygen Therapy SpO2: 100 % O2 Device: Not Delivered Pain: Pain Assessment Pain Assessment: 0-10 Pain Score: 9  Pain Type: Surgical pain Pain  Location: Back Pain Orientation: Upper Pain Descriptors / Indicators: Aching Pain Frequency: Intermittent Pain Onset: With Activity Pain Intervention(s): Medication (See eMAR)  See Function Navigator for Current Functional Status.   Therapy/Group: Individual Therapy  Simonne Come 09/05/2015, 8:45 AM

## 2015-09-05 NOTE — Progress Notes (Signed)
Physical Therapy Session Note  Patient Details  Name: Seth [name] MRN: [id number] Date of Birth: Apr 25, 1966  Today's Date: 09/05/2015 PT Individual Time: 1000-1100 PT Individual Time Calculation (min): 60 min    Skilled Therapeutic Interventions/Progress Updates:   Gait Training: 1 x from room to therapy gym with Rw with Supervision and cues for postural correction forward gaze and heel toe progression of gait. Short distance gait training in the therapy gym with focus on quality of gait and decreased dependency on UE. Training in maneuvering up and down 4 6 inch steps up and down with b rails with min A and cues for sequencing. Patient very fearful of stair training.  TE: Exercises sitting with 8 lbs on B ankles-LAQ 2 x 10 with 3 s hold at the end of range, hamstring curls, hip abd and add, in standing ,step ups and minin lunges on foam board, marching in place TA: training in sit to stand from mat placed at 19 inches in order to facilitate strength and balance on initial stance 5 x w/o UE support and 5 x with UE push. Training in transfer between w/c and mat with Supervision and increased time to complete. Increased rest time needed between activities due to SOB and fatigue.  Patient propelled his w/c back to room and was left with all needs within reach.   Therapy Documentation Precautions:  Precautions Precautions: Fall, Back Precaution Comments: cues to adhere to back precautions Required Braces or Orthoses: Other Brace/Splint Other Brace/Splint: R knee brace Restrictions Weight Bearing Restrictions: No Pain: Pain Assessment Pain Score: 3   See Function Navigator for Current Functional Status.   Therapy/Group: Individual Therapy  Guadlupe Spanish 09/05/2015, 12:36 PM

## 2015-09-05 NOTE — Progress Notes (Signed)
Subjective: Interval History: has no complaint .  Objective: Vital signs in last 24 hours: Temp:  [97.3 F (36.3 C)-98.4 F (36.9 C)] 97.3 F (36.3 C) (11/26 0536) Pulse Rate:  [77-88] 87 (11/26 0536) Resp:  [18-20] 18 (11/26 0536) BP: (126-185)/(70-99) 147/82 mmHg (11/26 0536) SpO2:  [100 %] 100 % (11/26 0536) Weight:  [134.3 kg (296 lb 1.2 oz)-140.5 kg (309 lb 11.9 oz)] 134.3 kg (296 lb 1.2 oz) (11/26 0500) Weight change: 0.4 kg (14.1 oz)  Intake/Output from previous day: 11/25 0701 - 11/26 0700 In: 1080 [P.O.:1080] Out: 5000  Intake/Output this shift: Total I/O In: 360 [P.O.:360] Out: -   General appearance: alert, cooperative and morbidly obese Resp: diminished breath sounds bilaterally Cardio: S1, S2 normal and systolic murmur: holosystolic 2/6, blowing at apex GI: abnormal findings:  massive, pos bs Extremities: edema 2+ and AVF RFA  Lab Results: No results for input(s): WBC, HGB, HCT, PLT in the last 72 hours. BMET: No results for input(s): NA, K, CL, CO2, GLUCOSE, BUN, CREATININE, CALCIUM in the last 72 hours. No results for input(s): PTH in the last 72 hours. Iron Studies: No results for input(s): IRON, TIBC, TRANSFERRIN, FERRITIN in the last 72 hours.  Studies/Results: No results found.  I have reviewed the patient's current medications.  Assessment/Plan: 1 ESRD still vol xs but got 5 [name] off . xs intake here 2 Dm controlled 3 Anemia ESA, 4 HPTH vit D 5 HTN lower with lower vol 6 massive obesity 7 osteo of spine P HD MON, lower vol , cont esa, Vit D    LOS: 19 days   Seth [name] 09/05/2015,1:02 PM

## 2015-09-05 NOTE — Progress Notes (Signed)
Pt refused dose of ancef today. Dr. Angelique Holm and Dr. Jimmy Footman contacted and aware.

## 2015-09-05 NOTE — Progress Notes (Signed)
Patient states he places himself on and off of CPAP when ready without assistance. RT made sure water chamber was full. Informed patient if he needed any help have RN contact RT.

## 2015-09-05 NOTE — Progress Notes (Signed)
PHYSICAL MEDICINE & REHABILITATION     PROGRESS NOTE    Subjective/Complaints: Up at EOB. No complaints. Saw walking with therapy later.  ROS: Pt denies fever, rash/itching, headache, blurred or double vision, nausea, vomiting, abdominal pain, diarrhea, chest pain, shortness of breath, palpitations, dysuria, dizziness, bleeding, anxiety, or depression   Objective: Vital Signs: Blood pressure 147/82, pulse 87, temperature 97.3 F (36.3 C), temperature source Oral, resp. rate 18, weight 134.3 kg (296 lb 1.2 oz), SpO2 100 %. No results found. No results for input(s): WBC, HGB, HCT, PLT in the last 72 hours. No results for input(s): NA, K, CL, GLUCOSE, BUN, CREATININE, CALCIUM in the last 72 hours.  Invalid input(s): CO CBG (last 3)   Recent Labs  09/04/15 1628 09/04/15 2303 09/05/15 0647  GLUCAP 145* 218* 168*    Wt Readings from Last 3 Encounters:  09/05/15 134.3 kg (296 lb 1.2 oz)  08/17/15 136.8 kg (301 lb 9.4 oz)  08/10/15 139.254 kg (307 lb)    Physical Exam:  Constitutional: He is oriented to person, place, and time. He appears well-developed and well-nourished.  Morbidly obese male . nad  HENT:  Head: Normocephalic and atraumatic.  Eyes: Conjunctivae and EOM are normal. Pupils are equal, round, and reactive to light.  Neck: Normal range of motion. Neck supple.   Cardiovascular: Normal rate and regular rhythm.  Respiratory: Effort normal and breath sounds normal. No respiratory distress. He has no wheezes.  GI: Soft. Bowel sounds are normal. He exhibits no distension. There is no tenderness.   Musculoskeletal: right knee with much less pain during AROM/PROM Neuro: Strength B/l UE 4+/5 throughout LLE hip flexion 4/5, ankle dorsi/plantar flexion 4+/5 RLE: hip flexion 4-/5, ankle dorsi/plantar flexion 4+/5  He is alert and oriented to person, place, and time.  Speech clear.  Able to follow commands without difficulty.  He is alert and oriented to  person, place, and time.  He has normal reflexes.  Intentional tremors BUE. Sensation intact to light touchin all 4 limbs  Skin: Skin is warm and dry. Surgical site with honeycomb dressing, drain with minimal drainage.   BLE are improving.   Psychiatric: He has a normal mood and affect. His speech is normal. Thought content normal. Cognition and memory are normal.     Assessment/Plan: 1. Functional deficits secondary to T4 osteomyelitis with cord compression/myelopathy which require 3+ hours per day of interdisciplinary therapy in a comprehensive inpatient rehab setting. Physiatrist is providing close team supervision and 24 hour management of active medical problems listed below. Physiatrist and rehab team continue to assess barriers to discharge/monitor patient progress toward functional and medical goals.  Function:  Bathing Bathing position   Position: Shower  Bathing parts Body parts bathed by patient: Right arm, Left arm, Chest, Abdomen, Front perineal area, Right upper leg, Left upper leg, Right lower leg, Left lower leg, Back, Buttocks Body parts bathed by helper: Buttocks  Bathing assist Assist Level: Supervision or verbal cues      Upper Body Dressing/Undressing Upper body dressing   What is the patient wearing?: Pull over shirt/dress     Pull over shirt/dress - Perfomed by patient: Thread/unthread right sleeve, Thread/unthread left sleeve, Put head through opening, Pull shirt over trunk Pull over shirt/dress - Perfomed by helper: Pull shirt over trunk        Upper body assist Assist Level: No help, No cues      Lower Body Dressing/Undressing Lower body dressing   What is the patient  wearing?: Pants, Shoes, Non-skid slipper socks Underwear - Performed by patient: Thread/unthread right underwear leg, Thread/unthread left underwear leg, Pull underwear up/down Underwear - Performed by helper: Pull underwear up/down Pants- Performed by patient: Thread/unthread  right pants leg, Thread/unthread left pants leg, Pull pants up/down Pants- Performed by helper: Thread/unthread right pants leg, Pull pants up/down Non-skid slipper socks- Performed by patient: Don/doff right sock, Don/doff left sock Non-skid slipper socks- Performed by helper: Don/doff right sock, Don/doff left sock   Socks - Performed by helper: Don/doff right sock, Don/doff left sock Shoes - Performed by patient: Don/doff right shoe, Don/doff left shoe Shoes - Performed by helper: Fasten right, Fasten left          Lower body assist Assist for lower body dressing: Touching or steadying assistance (Pt > 75%)      Toileting Toileting Toileting activity did not occur: No continent bowel/bladder event (no bowel, urinal for urine) Toileting steps completed by patient: Adjust clothing prior to toileting, Performs perineal hygiene, Adjust clothing after toileting Toileting steps completed by helper: Adjust clothing prior to toileting, Performs perineal hygiene, Adjust clothing after toileting Toileting Assistive Devices: Grab bar or rail  Toileting assist Assist level: More than reasonable time   Transfers Chair/bed transfer   Chair/bed transfer method: Squat pivot Chair/bed transfer assist level: Supervision or verbal cues Chair/bed transfer assistive device: Walker, Other (knee brace)     Locomotion Ambulation Ambulation activity did not occur: Safety/medical concerns   Max distance: 180 Assist level: Supervision or verbal cues   Wheelchair   Type: Manual Max wheelchair distance: 30 Assist Level: No help, No cues, assistive device, takes more than reasonable amount of time  Cognition Comprehension Comprehension assist level: Understands complex 90% of the time/cues 10% of the time  Expression Expression assist level: Expresses complex 90% of the time/cues < 10% of the time  Social Interaction Social Interaction assist level: Interacts appropriately 90% of the time - Needs  monitoring or encouragement for participation or interaction.  Problem Solving Problem solving assist level: Solves basic 75 - 89% of the time/requires cueing 10 - 24% of the time  Memory Memory assist level: Recognizes or recalls 75 - 89% of the time/requires cueing 10 - 24% of the time   Medical Problem List and Plan: 1. 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 DVT Prophylaxis/Anticoagulation: continue lovenox 3. Pain Management: Continue oxy IR prn. Continue 12.mcg fentanyl patch,  -ice prn  -activity tolerance improving  -hinged neoprene knee brace RLE has been quite beneficial for support. Received second brace also for left  -s/p right knee steroid injection with good results 4. Mood: LCSW to follow for evaluation and support.  5. Neuropsych: This patient is capable of making decisions on his own behalf. 6. Skin/Wound Care: continuelocal care  7. Fluids/Electrolytes/Nutrition: Monitor I/O. ON renal diet with 1200 cc/FR.   -labs with HD 8. ESRD: On HD MWF. Scheduled sessions later in afternoons to help with therapy tolerance.     9. HTN: Monitor BID. Continue Norvasc.   -overall improved 10. Anemia of chronic disease:following up with HD (8.1) 11. DM type 2:   use SSI for elevated BS.   -added amaryl for hyperglemica---increase to 2mg  daily    12. OSA: Continue use of CPAP.  13. Constipation:   -continue lax/soft  -prn sorbitol 14. Leukocytosis: back up to 16k most recently---has  ranged from 10-16k over the last 2 weeks  -labs  with HD  -no change in plan  -keflex with HD  LOS (Days) 19 A FACE TO FACE EVALUATION WAS PERFORMED  Seth [name] 09/05/2015 10:17 AM

## 2015-09-05 NOTE — Progress Notes (Signed)
Physical Therapy Session Note  Patient Details  Name: Seth [name] MRN: [id number] Date of Birth: Aug 03, 1966  Today's Date: 09/05/2015 PT Individual Time: [id number] PT Individual Time Calculation (min): 45 min     Skilled Therapeutic Interventions/Progress Updates:  Patient in room , agitated after is conversation with family memeber, refuses to participate in any type of gait training. Self propelled to therapy gym. Seated exercises included: boxing ,weighted exercises for core strengthening with 7 lbs weights in b UE ,kinetron x 10 min ,per patient request. Patient returned to room with all needs within reach.  Therapy Documentation Precautions:  Precautions Precautions: Fall, Back Precaution Comments: cues to adhere to back precautions Required Braces or Orthoses: Other Brace/Splint Other Brace/Splint: R knee brace Restrictions Weight Bearing Restrictions: No Vital Signs: Therapy Vitals Temp: 98.2 F (36.8 C) Temp Source: Oral Pulse Rate: 82 Resp: 18 BP: (!) 163/68 mmHg Patient Position (if appropriate): Sitting Oxygen Therapy SpO2: 97 % O2 Device: Not Delivered   See Function Navigator for Current Functional Status.   Therapy/Group: Individual Therapy  Guadlupe Spanish 09/05/2015, 3:50 PM

## 2015-09-06 ENCOUNTER — Inpatient Hospital Stay (HOSPITAL_COMMUNITY): Payer: Medicare Other | Admitting: Physical Therapy

## 2015-09-06 LAB — GLUCOSE, CAPILLARY
GLUCOSE-CAPILLARY: 138 mg/dL — AB (ref 65–99)
GLUCOSE-CAPILLARY: 117 mg/dL — AB (ref 65–99)
GLUCOSE-CAPILLARY: 127 mg/dL — AB (ref 65–99)
GLUCOSE-CAPILLARY: 124 mg/dL — AB (ref 65–99)

## 2015-09-06 NOTE — Progress Notes (Signed)
Subjective: Interval History: has no complaint, getting stronger.  Objective: Vital signs in last 24 hours: Temp:  [97.8 F (36.6 C)-98.2 F (36.8 C)] 97.8 F (36.6 C) (11/27 0511) Pulse Rate:  [73-82] 73 (11/27 0511) Resp:  [18-20] 20 (11/27 0511) BP: (138-178)/(68-82) 138/77 mmHg (11/27 0511) SpO2:  [97 %-99 %] 99 % (11/27 0511) Weight:  [137.1 kg (302 lb 4 oz)] 137.1 kg (302 lb 4 oz) (11/27 0511) Weight change: -3.4 kg (-7 lb 7.9 oz)  Intake/Output from previous day: 11/26 0701 - 11/27 0700 In: 1080 [P.O.:1080] Out: -  Intake/Output this shift: Total I/O In: 240 [P.O.:240] Out: -   General appearance: alert, cooperative, no distress and morbidly obese Resp: diminished breath sounds bilaterally Cardio: S1, S2 normal and systolic murmur: holosystolic 2/6, blowing at apex GI: obese, soft, pos bs Extremities: edema 1 +, AVF RLA B&T  Lab Results: No results for input(s): WBC, HGB, HCT, PLT in the last 72 hours. BMET: No results for input(s): NA, K, CL, CO2, GLUCOSE, BUN, CREATININE, CALCIUM in the last 72 hours. No results for input(s): PTH in the last 72 hours. Iron Studies: No results for input(s): IRON, TIBC, TRANSFERRIN, FERRITIN in the last 72 hours.  Studies/Results: No results found.  I have reviewed the patient's current medications.  Assessment/Plan: 1 ESRD for HD tomorrow, lower vol furthur. xs intake 2 HTn lower with lower vol 3 Anemia esa/fe 4 HPTH vit D, cinnalcalcet 5 osteo of spine AB 6 DM controlled 7 massive obesity P HD, lower vol, follow meds closely with lower vol, control DM, cont esa    LOS: 20 days   Seth [name] L 09/06/2015,10:32 AM

## 2015-09-06 NOTE — Progress Notes (Signed)
Patient places self on and off of CPAP when ready. 

## 2015-09-06 NOTE — Progress Notes (Signed)
Physical Therapy Session Note  Patient Details  Name: Seth [name] MRN: [id number] Date of Birth: 08-24-1966  Today's Date: 09/06/2015 PT Individual Time: 1530-1600 PT Individual Time Calculation (min): 30 min   Short Term Goals: Week 3:  PT Short Term Goal 1 (Week 3): = LTGs due to d/c planned 11/30  Skilled Therapeutic Interventions/Progress Updates:   Focus on standing HEP for BLE strengthening, standing balance, and activity tolerance. Patient received sitting edge of bed, requesting to stay in room for therapy due to "working out all day (with weighted bar and seated BUE/BLE therex)." Standing therex using RW for BUE support x 20 each exercise with supervision: marching, heel raises, knee flexion, hip abduction (using sink for UE support), and mini squats with focus on keeping back straight, hip abduction, and full hip extension when returning to stand. Patient required 3 seated rest breaks due to fatigue. Patient left sitting edge of bed with dinner tray set up with all needs within reach.   Therapy Documentation Precautions:  Precautions Precautions: Fall, Back Precaution Comments: cues to adhere to back precautions Required Braces or Orthoses: Other Brace/Splint Other Brace/Splint: R knee brace Restrictions Weight Bearing Restrictions: No Pain: Pain Assessment Pain Assessment: No/denies pain  See Function Navigator for Current Functional Status.   Therapy/Group: Individual Therapy  Laretta Alstrom 09/06/2015, 5:00 PM

## 2015-09-06 NOTE — Progress Notes (Signed)
[facility] PHYSICAL MEDICINE & REHABILITATION     PROGRESS NOTE    Subjective/Complaints: Up at EOB. No complaints again.   ROS: Pt denies fever, rash/itching, headache, blurred or double vision, nausea, vomiting, abdominal pain, diarrhea, chest pain, shortness of breath, palpitations, dysuria, dizziness, bleeding, anxiety, or depression   Objective: Vital Signs: Blood pressure 138/77, pulse 73, temperature 97.8 F (36.6 C), temperature source Oral, resp. rate 20, weight 137.1 kg (302 lb 4 oz), SpO2 99 %. No results found. No results for input(s): WBC, HGB, HCT, PLT in the last 72 hours. No results for input(s): NA, K, CL, GLUCOSE, BUN, CREATININE, CALCIUM in the last 72 hours.  Invalid input(s): CO CBG (last 3)   Recent Labs  09/05/15 1632 09/05/15 2103 09/06/15 0631  GLUCAP 174* 120* 138*    Wt Readings from Last 3 Encounters:  09/06/15 137.1 kg (302 lb 4 oz)  08/17/15 136.8 kg (301 lb 9.4 oz)  08/10/15 139.254 kg (307 lb)    Physical Exam:  Constitutional: He is oriented to person, place, and time. He appears well-developed and well-nourished.  Morbidly obese male . nad  HENT:  Head: Normocephalic and atraumatic.  Eyes: Conjunctivae and EOM are normal. Pupils are equal, round, and reactive to light.  Neck: Normal range of motion. Neck supple.   Cardiovascular: Normal rate and regular rhythm. systolic murmur Respiratory: Effort normal and breath sounds normal. No respiratory distress. He has no wheezes.  GI: Soft. Bowel sounds are normal. He exhibits no distension. There is no tenderness.   Musculoskeletal: right knee with much less pain during AROM/PROM Neuro: Strength B/l UE 4+/5 throughout LLE hip flexion 4/5, ankle dorsi/plantar flexion 4+/5 RLE: hip flexion 4-/5, ankle dorsi/plantar flexion 4+/5  He is alert and oriented to person, place, and time.  Speech clear.  Able to follow commands without difficulty.  He is alert and oriented to person, place,  and time.  He has normal reflexes.  Intentional tremors BUE. Sensation intact to light touchin all 4 limbs  Skin: Skin is warm and dry. Surgical site with honeycomb dressing, drain with minimal drainage.   BLE are improving.   Psychiatric: He has a normal mood and affect. His speech is normal. Thought content normal. Cognition and memory are normal.     Assessment/Plan: 1. Functional deficits secondary to T4 osteomyelitis with cord compression/myelopathy which require 3+ hours per day of interdisciplinary therapy in a comprehensive inpatient rehab setting. Physiatrist is providing close team supervision and 24 hour management of active medical problems listed below. Physiatrist and rehab team continue to assess barriers to discharge/monitor patient progress toward functional and medical goals.  Function:  Bathing Bathing position   Position: Shower  Bathing parts Body parts bathed by patient: Right arm, Left arm, Chest, Abdomen, Front perineal area, Right upper leg, Left upper leg, Right lower leg, Left lower leg, Back, Buttocks Body parts bathed by helper: Buttocks  Bathing assist Assist Level: Supervision or verbal cues      Upper Body Dressing/Undressing Upper body dressing   What is the patient wearing?: Pull over shirt/dress     Pull over shirt/dress - Perfomed by patient: Thread/unthread right sleeve, Thread/unthread left sleeve, Put head through opening, Pull shirt over trunk Pull over shirt/dress - Perfomed by helper: Pull shirt over trunk        Upper body assist Assist Level: No help, No cues      Lower Body Dressing/Undressing Lower body dressing   What is the patient wearing?: Pants,  Shoes, Non-skid slipper socks Underwear - Performed by patient: Thread/unthread right underwear leg, Thread/unthread left underwear leg, Pull underwear up/down Underwear - Performed by helper: Pull underwear up/down Pants- Performed by patient: Thread/unthread right pants leg,  Thread/unthread left pants leg, Pull pants up/down Pants- Performed by helper: Thread/unthread right pants leg, Pull pants up/down Non-skid slipper socks- Performed by patient: Don/doff right sock, Don/doff left sock Non-skid slipper socks- Performed by helper: Don/doff right sock, Don/doff left sock   Socks - Performed by helper: Don/doff right sock, Don/doff left sock Shoes - Performed by patient: Don/doff right shoe, Don/doff left shoe Shoes - Performed by helper: Fasten right, Fasten left          Lower body assist Assist for lower body dressing: Touching or steadying assistance (Pt > 75%)      Toileting Toileting Toileting activity did not occur: No continent bowel/bladder event (no bowel, urinal for urine) Toileting steps completed by patient: Adjust clothing prior to toileting, Performs perineal hygiene, Adjust clothing after toileting Toileting steps completed by helper: Adjust clothing prior to toileting, Performs perineal hygiene, Adjust clothing after toileting Toileting Assistive Devices: Grab bar or rail  Toileting assist Assist level: More than reasonable time   Transfers Chair/bed transfer   Chair/bed transfer method: Squat pivot Chair/bed transfer assist level: Supervision or verbal cues Chair/bed transfer assistive device: Walker, Other (knee brace)     Locomotion Ambulation Ambulation activity did not occur: Safety/medical concerns   Max distance: 180 Assist level: Supervision or verbal cues   Wheelchair   Type: Manual Max wheelchair distance: 30 Assist Level: No help, No cues, assistive device, takes more than reasonable amount of time  Cognition Comprehension Comprehension assist level: Understands complex 90% of the time/cues 10% of the time  Expression Expression assist level: Expresses complex 90% of the time/cues < 10% of the time  Social Interaction Social Interaction assist level: Interacts appropriately 90% of the time - Needs monitoring or  encouragement for participation or interaction.  Problem Solving Problem solving assist level: Solves basic 75 - 89% of the time/requires cueing 10 - 24% of the time  Memory Memory assist level: Recognizes or recalls 75 - 89% of the time/requires cueing 10 - 24% of the time   Medical Problem List and Plan: 1. 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 DVT Prophylaxis/Anticoagulation: continue lovenox 3. Pain Management: Continue oxy IR prn. Continue 12.mcg fentanyl patch,  -ice prn  -activity tolerance improving  -hinged neoprene knee brace RLE has been quite beneficial for support. Received second brace also for left  -s/p right knee steroid injection with good results 4. Mood: LCSW to follow for evaluation and support.  5. Neuropsych: This patient is capable of making decisions on his own behalf. 6. Skin/Wound Care: continuelocal care  7. Fluids/Electrolytes/Nutrition: Monitor I/O. ON renal diet with 1200 cc/FR.   -labs with HD 8. ESRD: On HD MWF. Scheduled sessions later in afternoons to help with therapy tolerance.     9. HTN: Monitor BID. Continue Norvasc.   -overall improved 10. Anemia of chronic disease:following up with HD (8.1) 11. DM type 2:    SSI for elevated BS.   -  amaryl increased to 2mg  daily    12. OSA: Continue use of CPAP.  13. Constipation:   -continue lax/soft  -prn sorbitol 14. Leukocytosis: back up to 16k most recently---has ranged from  10-16k over the last 2 weeks  -labs  with HD  -no change in plan  -keflex with HD per nephrology  LOS (Days) 20 A FACE TO FACE EVALUATION WAS PERFORMED  SWARTZ,ZACHARY T 09/06/2015 8:23 AM

## 2015-09-07 ENCOUNTER — Inpatient Hospital Stay (HOSPITAL_COMMUNITY): Payer: Non-veteran care | Admitting: Occupational Therapy

## 2015-09-07 ENCOUNTER — Inpatient Hospital Stay (HOSPITAL_COMMUNITY): Payer: Medicare Other

## 2015-09-07 LAB — RENAL FUNCTION PANEL
Albumin: 3.5 g/dL (ref 3.5–5.0)
Anion gap: 14 (ref 5–15)
BUN: 97 mg/dL — ABNORMAL HIGH (ref 6–20)
CHLORIDE: 88 mmol/L — AB (ref 101–111)
CO2: 23 mmol/L (ref 22–32)
CREATININE: 11.14 mg/dL — AB (ref 0.61–1.24)
Calcium: 8.2 mg/dL — ABNORMAL LOW (ref 8.9–10.3)
GFR calc non Af Amer: 5 mL/min — ABNORMAL LOW (ref >60)
GFR, EST AFRICAN AMERICAN: 5 mL/min — AB (ref >60)
Glucose, Bld: 96 mg/dL (ref 65–99)
PHOSPHORUS: 4.3 mg/dL (ref 2.5–4.6)
Potassium: 4.7 mmol/L (ref 3.5–5.1)
Sodium: 125 mmol/L — ABNORMAL LOW (ref 135–145)

## 2015-09-07 LAB — GLUCOSE, CAPILLARY
GLUCOSE-CAPILLARY: 112 mg/dL — AB (ref 65–99)
GLUCOSE-CAPILLARY: 133 mg/dL — AB (ref 65–99)
Glucose-Capillary: 146 mg/dL — ABNORMAL HIGH (ref 65–99)

## 2015-09-07 LAB — CBC
HEMATOCRIT: 27 % — AB (ref 39.0–52.0)
Hemoglobin: 8.6 g/dL — ABNORMAL LOW (ref 13.0–17.0)
MCH: 29.6 pg (ref 26.0–34.0)
MCHC: 31.9 g/dL (ref 30.0–36.0)
MCV: 92.8 fL (ref 78.0–100.0)
Platelets: 262 10*3/uL (ref 150–400)
RBC: 2.91 MIL/uL — AB (ref 4.22–5.81)
RDW: 15.4 % (ref 11.5–15.5)
WBC: 10.7 10*3/uL — AB (ref 4.0–10.5)

## 2015-09-07 MED ORDER — CALCITRIOL 0.5 MCG PO CAPS
ORAL_CAPSULE | ORAL | Status: AC
Start: 2015-09-07 — End: 2015-09-07
  Administered 2015-09-07: 0.5 ug via ORAL
  Filled 2015-09-07: qty 1

## 2015-09-07 MED ORDER — DARBEPOETIN ALFA 200 MCG/0.4ML IJ SOSY
PREFILLED_SYRINGE | INTRAMUSCULAR | Status: AC
  Filled 2015-09-07: qty 0.4

## 2015-09-07 MED ORDER — LIDOCAINE-PRILOCAINE 2.5-2.5 % EX CREA: 1.0000 "application " | TOPICAL_CREAM | CUTANEOUS | Status: DC | PRN

## 2015-09-07 MED ORDER — SODIUM CHLORIDE 0.9 % IV SOLN: 100.0000 mL | INTRAVENOUS | Status: DC | PRN

## 2015-09-07 MED ORDER — ALTEPLASE 2 MG IJ SOLR
2.0000 mg | Freq: Once | INTRAMUSCULAR | Status: DC | PRN
  Filled 2015-09-07: qty 2

## 2015-09-07 MED ORDER — LIDOCAINE HCL (PF) 1 % IJ SOLN: 5.0000 mL | INTRAMUSCULAR | Status: DC | PRN

## 2015-09-07 MED ORDER — PENTAFLUOROPROP-TETRAFLUOROETH EX AERO: 1.0000 "application " | INHALATION_SPRAY | CUTANEOUS | Status: DC | PRN

## 2015-09-07 MED ORDER — HEPARIN SODIUM (PORCINE) 1000 UNIT/ML DIALYSIS: 1000.0000 [IU] | INTRAMUSCULAR | Status: DC | PRN

## 2015-09-07 MED ORDER — HEPARIN SODIUM (PORCINE) 1000 UNIT/ML DIALYSIS
100.0000 [IU]/kg | INTRAMUSCULAR | Status: DC | PRN
  Filled 2015-09-07: qty 14

## 2015-09-07 MED ORDER — GLIMEPIRIDE 2 MG PO TABS
2.0000 mg | ORAL_TABLET | Freq: Every day | ORAL | Status: DC
  Administered 2015-09-08 – 2015-09-09 (×2): 2 mg via ORAL
  Filled 2015-09-07 (×2): qty 1

## 2015-09-07 MED ORDER — GLIMEPIRIDE 2 MG PO TABS
1.0000 mg | ORAL_TABLET | Freq: Once | ORAL | Status: AC
  Administered 2015-09-07: 1 mg via ORAL
  Filled 2015-09-07: qty 1

## 2015-09-07 NOTE — Progress Notes (Signed)
Physical Therapy Session Note  Patient Details  Name: Seth [name] MRN: [id number] Date of Birth: Aug 08, 1966  Today's Date: 09/07/2015 PT Individual Time: 1300-1400 PT Individual Time Calculation (min): 60 min   Short Term Goals: Week 3:  PT Short Term Goal 1 (Week 3): = LTGs due to d/c planned 11/30  Skilled Therapeutic Interventions/Progress Updates:   Session focused on functional strengthening and endurance to aid with overall mobility. Pt return demonstrated HEP including standing hamstring curls x 20 reps each, seated marches x 10 reps each, sit to stands without UE support x 10 reps with focus on eccentric control, seated core strengthening with blue medicine ball x 20 reps diagonals within range to limit back precautions in each direction, standing heel raises x 20 reps, mini squats x 10 reps, and hip adduction ball squeezes with 5 second hold x 20 reps each. Pt using 5# ankle weights on each LE. Pt propelled w/c to and from therapy gym.   Therapy Documentation Precautions:  Precautions Precautions: Fall, Back Precaution Comments: recalls 3/3 back precautions Required Braces or Orthoses: Other Brace/Splint Other Brace/Splint: bilateral knee braces Restrictions Weight Bearing Restrictions: No   Pain:  Denies pain.   See Function Navigator for Current Functional Status.   Therapy/Group: Individual Therapy  Canary Brim Ivory Broad, PT, DPT  09/07/2015, 3:32 PM

## 2015-09-07 NOTE — Progress Notes (Signed)
Tx completed with post assessment unchanged.  Report called to bedside RN.   Goal of 4663mL, net removal of 4L.  Pt persistently requested UF turned off with 40 minutes left at goal of 5542mL due to cramping.

## 2015-09-07 NOTE — Progress Notes (Signed)
Occupational Therapy Session Note  Patient Details  Name: Seth [name] MRN: [id number] Date of Birth: 1966-02-22  Today's Date: 09/07/2015 OT Individual Time: [id number] and 1100-1200 OT Individual Time Calculation (min): 60 min and 60 min   Short Term Goals: Week 3:  OT Short Term Goal 1 (Week 3): STG=LTG due to LOS  Skilled Therapeutic Interventions/Progress Updates:    Session One: Pt seen for OT therapy session focusing on functional activity tolerance, sit <> stands, functional mobility, and education. Pt sitting EOB upon arrival, awaiting breakfast tray. He ambulated throughout room using RW with supervision to stand at sink to wash face, alternating from standing straight up to resting with B UEs on sink ledge.   While awaiting breakfast tray, pt ambulated in hallway with distant supervision using RW. He then completed UE strengthening exercises using #4 dowel, recalling exercises and breathing techniques taught in previous sessions.  In therapy gym, pt completed 9x sit <> stands for therapy mat at various height levels without UE support for LE strengthening and in prep for functional transfers. Rest breaks required throughout. Pt self propelled w/c back to room at end of session, left sitting in w/c with all needs in reach.  Education completed throughout session regarding continnum of care, d/c services, energy conservation, community mobility, OT goals, and d/c planning.   Session Two: Pt seen for OT therapy session focusing on functional ambulation and functional activity tolerance. Pt sitting in w/c upon arrival, agreeable to tx session. He self propelled w/c to off unit hospital gift store with VCs for managing elevator. He ambulated throughout gift store with supervision and cues for awareness to change in flooring surface. He practiced navigating RW over uneven surfaces and carpet surface. Educated regarding functional implication for community mobility and visiting dr. Vertis Kelch at  d/c. Pt then ambulated outside managing RW over brick walk way. Pt with 1 episode of knee buckling, able to self correct with min steadying assist.  Education and demonstration provided regarding use of anti-tipper device on w/c and functional implications for community mobility and how to direct family with use. Pt returned to room at end of session, left sitting in w/c with all needs in reach.   Therapy Documentation Precautions:  Precautions Precautions: Fall, Back Precaution Comments: cues to adhere to back precautions Required Braces or Orthoses: Other Brace/Splint Other Brace/Splint: R knee brace Restrictions Weight Bearing Restrictions: No Pain: Pain Assessment Pain Score: 6  (back) Repositioned/ increased activity  See Function Navigator for Current Functional Status.   Therapy/Group: Individual Therapy  Lewis, Jethro Radke C 09/07/2015, 7:09 AM

## 2015-09-07 NOTE — Progress Notes (Signed)
  Assessment/Plan: 1 ESRD for HD today, lower vol furthur. xs intake 2 HTN, suboptimal lower with lower vol 3 Anemia esa/fe 4 HPTH vit D, cinnalcalcet 5 osteo of spine AB 6 DM controlled 7 massive obesity  P HD, lower vol, follow meds closely with lower vol, control DM, cont esa  Subjective: Interval History: Working with rehab  Objective: Vital signs in last 24 hours: Temp:  [97.7 F (36.5 C)] 97.7 F (36.5 C) (11/28 0601) Pulse Rate:  [80] 80 (11/28 0601) Resp:  [19] 19 (11/28 0601) BP: (161)/(78) 161/78 mmHg (11/28 0601) SpO2:  [98 %] 98 % (11/28 0601) Weight:  [138.5 kg (305 lb 5.4 oz)] 138.5 kg (305 lb 5.4 oz) (11/28 0601) Weight change: 1.4 kg (3 lb 1.4 oz)  Intake/Output from previous day: 11/27 0701 - 11/28 0700 In: 600 [P.O.:600] Out: -  Intake/Output this shift: Total I/O In: 240 [P.O.:240] Out: -   General appearance: alert and cooperative Resp: clear to auscultation bilaterally Cardio: regular rate and rhythm, S1, S2 normal, no murmur, click, rub or gallop Extremities: edema tr  Lab Results: No results for input(s): WBC, HGB, HCT, PLT in the last 72 hours. BMET: No results for input(s): NA, K, CL, CO2, GLUCOSE, BUN, CREATININE, CALCIUM in the last 72 hours. No results for input(s): PTH in the last 72 hours. Iron Studies: No results for input(s): IRON, TIBC, TRANSFERRIN, FERRITIN in the last 72 hours. Studies/Results: No results found.  Scheduled: . amLODipine  10 mg Oral QHS  . antiseptic oral rinse  7 mL Mouth Rinse QID  . aspirin  81 mg Oral Daily  . calcitRIOL  0.5 mcg Oral Q M,W,F  . calcium acetate  2,001 mg Oral TID WC  .  ceFAZolin (ANCEF) IV  2 g Intravenous Q M,W,F-HD  .  ceFAZolin (ANCEF) IV  2 g Intravenous Once  . chlorhexidine gluconate  15 mL Mouth Rinse BID  . cinacalcet  30 mg Oral Q breakfast  . darbepoetin (ARANESP) injection - DIALYSIS  200 mcg Intravenous Q Mon-HD  . enoxaparin (LOVENOX) injection  30 mg Subcutaneous Q24H  .  fentaNYL  12.5 mcg Transdermal Q72H  . [START ON 09/08/2015] glimepiride  2 mg Oral Q breakfast  . hydrocerin   Topical BID  . insulin aspart  0-15 Units Subcutaneous TID WC  . insulin aspart  0-5 Units Subcutaneous QHS  . multivitamin  1 tablet Oral QHS  . pantoprazole  40 mg Oral QHS  . senna-docusate  2 tablet Oral QHS     LOS: 21 days   Merrie Epler C 09/07/2015,4:24 PM

## 2015-09-07 NOTE — Progress Notes (Signed)
[facility] PHYSICAL MEDICINE & REHABILITATION     PROGRESS NOTE    Subjective/Complaints: No new complaints.   ROS: Pt denies fever, rash/itching, headache, blurred or double vision, nausea, vomiting, abdominal pain, diarrhea, chest pain, shortness of breath, palpitations, dysuria, dizziness, bleeding, anxiety, or depression   Objective: Vital Signs: Blood pressure 161/78, pulse 80, temperature 97.7 F (36.5 C), temperature source Oral, resp. rate 19, weight 138.5 kg (305 lb 5.4 oz), SpO2 98 %. No results found. No results for input(s): WBC, HGB, HCT, PLT in the last 72 hours. No results for input(s): NA, K, CL, GLUCOSE, BUN, CREATININE, CALCIUM in the last 72 hours.  Invalid input(s): CO CBG (last 3)   Recent Labs  09/06/15 1619 09/06/15 2116 09/07/15 0712  GLUCAP 127* 124* 112*    Wt Readings from Last 3 Encounters:  09/07/15 138.5 kg (305 lb 5.4 oz)  08/17/15 136.8 kg (301 lb 9.4 oz)  08/10/15 139.254 kg (307 lb)    Physical Exam:  Constitutional: He is oriented to person, place, and time. He appears well-developed and well-nourished.  Morbidly obese male . nad  HENT:  Head: Normocephalic and atraumatic.  Eyes: Conjunctivae and EOM are normal. Pupils are equal, round, and reactive to light.  Neck: Normal range of motion. Neck supple.   Cardiovascular: Normal rate and regular rhythm. systolic murmur Respiratory: Effort normal and breath sounds normal. No respiratory distress. He has no wheezes.  GI: Soft. Bowel sounds are normal. He exhibits no distension. There is no tenderness.   Musculoskeletal: right knee with much less pain during AROM/PROM Neuro: Strength B/l UE 4+/5 throughout LLE hip flexion 4/5, ankle dorsi/plantar flexion 4+/5 RLE: hip flexion 4-/5, ankle dorsi/plantar flexion 4+/5  He is alert and oriented to person, place, and time.  Speech clear.  Able to follow commands without difficulty.  He is alert and oriented to person, place, and time.   He has normal reflexes.  Intentional tremors BUE. Sensation intact to light touchin all 4 limbs  Skin: Skin is warm and dry. Surgical site with honeycomb dressing, drain with minimal drainage.   BLE are improving.   Psychiatric: He has a normal mood and affect. His speech is normal. Thought content normal. Cognition and memory are normal.     Assessment/Plan: 1. Functional deficits secondary to T4 osteomyelitis with cord compression/myelopathy which require 3+ hours per day of interdisciplinary therapy in a comprehensive inpatient rehab setting. Physiatrist is providing close team supervision and 24 hour management of active medical problems listed below. Physiatrist and rehab team continue to assess barriers to discharge/monitor patient progress toward functional and medical goals.  Function:  Bathing Bathing position   Position: Shower  Bathing parts Body parts bathed by patient: Right arm, Left arm, Chest, Abdomen, Front perineal area, Right upper leg, Left upper leg, Right lower leg, Left lower leg, Back, Buttocks Body parts bathed by helper: Buttocks  Bathing assist Assist Level: Supervision or verbal cues      Upper Body Dressing/Undressing Upper body dressing   What is the patient wearing?: Pull over shirt/dress     Pull over shirt/dress - Perfomed by patient: Thread/unthread right sleeve, Thread/unthread left sleeve, Put head through opening, Pull shirt over trunk Pull over shirt/dress - Perfomed by helper: Pull shirt over trunk        Upper body assist Assist Level: No help, No cues      Lower Body Dressing/Undressing Lower body dressing   What is the patient wearing?: Pants, Shoes, Non-skid slipper  socks Underwear - Performed by patient: Thread/unthread right underwear leg, Thread/unthread left underwear leg, Pull underwear up/down Underwear - Performed by helper: Pull underwear up/down Pants- Performed by patient: Thread/unthread right pants leg,  Thread/unthread left pants leg, Pull pants up/down Pants- Performed by helper: Thread/unthread right pants leg, Pull pants up/down Non-skid slipper socks- Performed by patient: Don/doff right sock, Don/doff left sock Non-skid slipper socks- Performed by helper: Don/doff right sock, Don/doff left sock   Socks - Performed by helper: Don/doff right sock, Don/doff left sock Shoes - Performed by patient: Don/doff right shoe, Don/doff left shoe Shoes - Performed by helper: Fasten right, Fasten left          Lower body assist Assist for lower body dressing: Touching or steadying assistance (Pt > 75%)      Toileting Toileting Toileting activity did not occur: No continent bowel/bladder event (no bowel, urinal for urine) Toileting steps completed by patient: Adjust clothing prior to toileting, Performs perineal hygiene, Adjust clothing after toileting Toileting steps completed by helper: Adjust clothing prior to toileting, Performs perineal hygiene, Adjust clothing after toileting Toileting Assistive Devices: Grab bar or rail  Toileting assist Assist level: More than reasonable time   Transfers Chair/bed transfer   Chair/bed transfer method: Squat pivot Chair/bed transfer assist level: Supervision or verbal cues Chair/bed transfer assistive device: Walker, Other (knee brace)     Locomotion Ambulation Ambulation activity did not occur: Safety/medical concerns   Max distance: 180 Assist level: Supervision or verbal cues   Wheelchair   Type: Manual Max wheelchair distance: 30 Assist Level: No help, No cues, assistive device, takes more than reasonable amount of time  Cognition Comprehension Comprehension assist level: Understands complex 90% of the time/cues 10% of the time  Expression Expression assist level: Expresses complex 90% of the time/cues < 10% of the time  Social Interaction Social Interaction assist level: Interacts appropriately 90% of the time - Needs monitoring or  encouragement for participation or interaction.  Problem Solving Problem solving assist level: Solves basic 75 - 89% of the time/requires cueing 10 - 24% of the time  Memory Memory assist level: Recognizes or recalls 75 - 89% of the time/requires cueing 10 - 24% of the time   Medical Problem List and Plan: 1. Functional deficits secondary to Cord compression secondary to Osteomyelitis/diskitis T4 and T5 with pathologic fracture, able to move both LEs  -abx with HD 2. DVT Prophylaxis/Anticoagulation: continue lovenox 3. Pain Management: Continue oxy IR prn. Continue 12.mcg fentanyl patch,  -ice prn  -activity tolerance improving  -hinged neoprene knee brace RLE has been quite beneficial for support. Received second brace also for left  -s/p right knee steroid injection with good results 4. Mood: LCSW to follow for evaluation and support.  5. Neuropsych: This patient is capable of making decisions on his own behalf. 6. Skin/Wound Care: continuelocal care  7. Fluids/Electrolytes/Nutrition: Monitor I/O. ON renal diet with 1200 cc/FR.   -labs with HD 8. ESRD: On HD MWF. Scheduled sessions later in afternoons to help with therapy tolerance.     9. HTN: Monitor BID. Continue Norvasc.   -overall improved 10. Anemia of chronic disease:following up with HD (8.1) 11. DM type 2:    SSI for elevated BS.   -  amaryl increased to 2mg  daily    12. OSA: Continue use of CPAP.  13. Constipation:   -continue lax/soft  -prn sorbitol 14. Leukocytosis: back up to 16k most recently---has ranged from 10-16k over the last 2 weeks  -  labs  with HD  -no change in plan  -keflex with HD per nephrology  LOS (Days) 21 A FACE TO FACE EVALUATION WAS PERFORMED  SWARTZ,ZACHARY T 09/07/2015 8:38 AM

## 2015-09-07 NOTE — Progress Notes (Signed)
Physical Therapy Session Note  Patient Details  Name: Seth [name] MRN: [id number] Date of Birth: 11-06-1965  Today's Date: 09/07/2015 PT Individual Time: 0900-1000 PT Individual Time Calculation (min): 60 min   Short Term Goals: Week 3:  PT Short Term Goal 1 (Week 3): = LTGs due to d/c planned 11/30  Skilled Therapeutic Interventions/Progress Updates:    Session focused on functional gait training at supervision level, stair negotiation training for home entry simulation with bilateral rails x 2 reps (min assist progressing to close supervision on second trial), dynamic gait through obstacle course to simulate home and community environment navigating turns, stepping over simulated threshold and gait over uneven surfaces with RW with cues for positioning of RW during turns, and discussion/education on community mobility and planning for community outing prior to discharge with the recreational therapist. Pt self propelled w/c for UE strengthening and endurance at mod I level on unit.  Therapy Documentation Precautions:  Precautions Precautions: Fall, Back Precaution Comments: recalls 3/3 back precautions Required Braces or Orthoses: Other Brace/Splint Other Brace/Splint: bilateral knee braces Restrictions Weight Bearing Restrictions: No   Pain: Premedicated for intermittent low back and knee pain.   See Function Navigator for Current Functional Status.   Therapy/Group: Individual Therapy  Canary Brim Ivory Broad, PT, DPT  09/07/2015, 12:01 PM

## 2015-09-07 NOTE — Progress Notes (Signed)
Recreational Therapy Session Note  Patient Details  Name: Seth [name] MRN: [id number] Date of Birth: 02/12/1966 Today's Date: 09/07/2015  Pain: no c/o Skilled Therapeutic Interventions/Progress Updates:  Met with pt about possibility of participating in community reintegration tomorrow, pt agreeable.   Chun Sellen 09/07/2015, 10:55 AM

## 2015-09-07 NOTE — Progress Notes (Signed)
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 ID saw- plan Cefazolin for 6 weeks with HD. Echo done 11/6- poor study, No vegetation noted on results. Afeb, WBC 10.9 > 16.7  His HD schedule is now back to regular MWF, plan for HD today. The 11/25 doses was not charted completely, but likely did give (per Rx dispensing record, additional dose dispensed on 11/25 PM which was during the end of HD)  9/24: BCx: Staph lugdunensis (pan sens) 11/1 blood: neg 11/1: op cx: neg  Ancef 11/2 >>  (09/24/15)  Plan - Continue scheduled Ancef 2g qHD (MWF) - f/u on HD schedule & tolerance

## 2015-09-08 ENCOUNTER — Inpatient Hospital Stay (HOSPITAL_COMMUNITY): Payer: Non-veteran care | Admitting: Occupational Therapy

## 2015-09-08 ENCOUNTER — Inpatient Hospital Stay (HOSPITAL_COMMUNITY): Payer: Medicare Other | Admitting: *Deleted

## 2015-09-08 ENCOUNTER — Inpatient Hospital Stay (HOSPITAL_COMMUNITY): Payer: Medicare Other

## 2015-09-08 LAB — GLUCOSE, CAPILLARY
GLUCOSE-CAPILLARY: 170 mg/dL — AB (ref 65–99)
GLUCOSE-CAPILLARY: 157 mg/dL — AB (ref 65–99)
Glucose-Capillary: 101 mg/dL — ABNORMAL HIGH (ref 65–99)

## 2015-09-08 NOTE — Progress Notes (Signed)
Physical Therapy Discharge Summary  Patient Details  Name: Seth [name] MRN: [id number] Date of Birth: 12-05-1965  Today's Date: 09/08/2015 PT Individual Time: 2947-6546 PT Individual Time Calculation (min): 30 min  Denies pain. Session focused on addressing functional w/c mobility on unit and over incline at modified independent level for overall strength and endurance, simulated car transfer to truck height modified independent with RW to prepare for d/c home tomorrow, stair negotiation with bilateral rails with supervision for safety and cues for which foot to lead with, and performed d/c assessment (see below for details). Pt made modified independent in room with RW. Denies any concerns in regards to planned d/c tomorrow. Pt appreciative of rehab experience as well as outing he participated in today.   Patient has met 10 of 10 long term goals due to improved activity tolerance, improved balance, improved postural control, increased strength, decreased pain, ability to compensate for deficits, functional use of  right lower extremity, improved attention, improved awareness and improved coordination.  Patient to discharge at an ambulatory level supervision to modified independent with RW .   Patient will have family available 24/7 to provide supervision assist as needed.   Reasons goals not met: n/a- all goals met at this time.  Recommendation:  Patient will benefit from ongoing skilled PT services in home health setting to continue to advance safe functional mobility, address ongoing impairments in endurance, strength, balance, functional mobility, and minimize fall risk.  Equipment: double wheeled wide RW, 22x18 w/c with basic cushion, bilateral knee braces  Reasons for discharge: treatment goals met and discharge from hospital  Patient/family agrees with progress made and goals achieved: Yes  PT Discharge Precautions/Restrictions Precautions Precautions: Fall;Back Precaution  Booklet Issued: Yes (comment) Precaution Comments: recalls 3/3 back precautions Required Braces or Orthoses: Other Brace/Splint Other Brace/Splint: bilateral knee braces Restrictions Weight Bearing Restrictions: No Cognition Overall Cognitive Status: Within Functional Limits for tasks assessed Arousal/Alertness: Awake/alert Orientation Level: Oriented X4 Focused Attention: Appears intact Sustained Attention: Appears intact Problem Solving: Appears intact Safety/Judgment: Appears intact Sensation Sensation Light Touch: Appears Intact Proprioception: Appears Intact Coordination Gross Motor Movements are Fluid and Coordinated: Yes Fine Motor Movements are Fluid and Coordinated: Yes Motor  Motor Motor: Within Functional Limits Motor - Discharge Observations: Pt with limitations due to body habitus and general deconditioning.   Trunk/Postural Assessment  Cervical Assessment Cervical Assessment: Within Functional Limits Thoracic Assessment Thoracic Assessment: Exceptions to [facility] (flexed posture, back precautions) Lumbar Assessment Lumbar Assessment: Exceptions to [facility] (body habitus limiting assessment; back precautions) Postural Control Postural Control:  (improved since admission; decreased balance without UE suppo)  Balance Balance Balance Assessed: Yes Static Sitting Balance Static Sitting - Level of Assistance: 6: Modified independent (Device/Increase time) Dynamic Sitting Balance Dynamic Sitting - Level of Assistance: 6: Modified independent (Device/Increase time) Static Standing Balance Static Standing - Level of Assistance: 6: Modified independent (Device/Increase time) Dynamic Standing Balance Dynamic Standing - Level of Assistance: 6: Modified independent (Device/Increase time) Extremity Assessment  RUE Assessment RUE Assessment: Within Functional Limits RUE AROM (degrees) Overall AROM Right Upper Extremity: Within functional limits for tasks performed RUE  Strength RUE Overall Strength: Within Functional Limits for tasks performed LUE Assessment LUE Assessment: Within Functional Limits LUE AROM (degrees) Overall AROM Left Upper Extremity: Within functional limits for tasks assessed LUE Strength LUE Overall Strength: Within Functional Limits for tasks assessed RLE Assessment RLE Assessment: Exceptions to [facility] RLE Strength RLE Overall Strength Comments: grossly 4-/5 LLE Assessment LLE Assessment: Within Functional Limits  See Function Navigator for Current Functional Status.  Canary Brim Ivory Broad, PT, DPT  09/08/2015, 3:40 PM

## 2015-09-08 NOTE — Progress Notes (Signed)
Physical Therapy Session Note  Patient Details  Name: Seth [name] MRN: [id number] Date of Birth: Mar 26, 1966  Today's Date: 09/08/2015 PT Individual Time: 1100-1300 PT Individual Time Calculation (min): 120 min   Short Term Goals: Week 3:  PT Short Term Goal 1 (Week 3): = LTGs due to d/c planned 11/30  Pt participated in community outing to lunch with focus on community mobility with RW and w/c over uneven surfaces and navigating obstacles, education on energy conservation, safety in community, and d/c planning discussion. Pt performed transfers from [place] as well as to regular chair in restaurant. Goals and further details on goal sheet. Pt performed mobility at overall supervision level in community with RW  Therapy Documentation Precautions:  Precautions Precautions: Fall, Back Precaution Comments: recalls 3/3 back precautions Required Braces or Orthoses: Other Brace/Splint Other Brace/Splint: bilateral knee braces Restrictions Weight Bearing Restrictions: No  Pain:  Denies pain.   See Function Navigator for Current Functional Status.   Therapy/Group: Individual Therapy and Community Reintegration Outing  Canary Brim [facility] 09/08/2015, 1:34 PM

## 2015-09-08 NOTE — Progress Notes (Signed)
[facility] PHYSICAL MEDICINE & REHABILITATION     PROGRESS NOTE    Subjective/Complaints: Tired from late evening HD, but otherwise feeling well.   ROS: Pt denies fever, rash/itching, headache, blurred or double vision, nausea, vomiting, abdominal pain, diarrhea, chest pain, shortness of breath, palpitations, dysuria, dizziness, bleeding, anxiety, or depression   Objective: Vital Signs: Blood pressure 156/91, pulse 86, temperature 97.5 F (36.4 C), temperature source Oral, resp. rate 18, weight 136.4 kg (300 lb 11.3 oz), SpO2 100 %. No results found.  Recent Labs  09/07/15 1739  WBC 10.7*  HGB 8.6*  HCT 27.0*  PLT 262    Recent Labs  09/07/15 1737  NA 125*  K 4.7  CL 88*  GLUCOSE 96  BUN 97*  CREATININE 11.14*  CALCIUM 8.2*   CBG (last 3)   Recent Labs  09/07/15 1216 09/07/15 2131 09/08/15 0652  GLUCAP 133* 146* 101*    Wt Readings from Last 3 Encounters:  09/08/15 136.4 kg (300 lb 11.3 oz)  08/17/15 136.8 kg (301 lb 9.4 oz)  08/10/15 139.254 kg (307 lb)    Physical Exam:  Constitutional: He is oriented to person, place, and time. He appears well-developed and well-nourished.  Morbidly obese male . nad  HENT:  Head: Normocephalic and atraumatic.  Eyes: Conjunctivae and EOM are normal. Pupils are equal, round, and reactive to light.  Neck: Normal range of motion. Neck supple.   Cardiovascular: Normal rate and regular rhythm. systolic murmur Respiratory: Effort normal and breath sounds normal. No respiratory distress. He has no wheezes.  GI: Soft. Bowel sounds are normal. He exhibits no distension. There is no tenderness.   Musculoskeletal: right knee with much less pain during AROM/PROM Neuro: Strength B/l UE 4+/5 throughout LLE hip flexion 4/5, ankle dorsi/plantar flexion 4+/5 RLE: hip flexion 4-/5, ankle dorsi/plantar flexion 4+/5  He is alert and oriented to person, place, and time.  Speech clear.  Able to follow commands without difficulty.   He is alert and oriented to person, place, and time.  He has normal reflexes.  Intentional tremors BUE. Sensation intact to light touchin all 4 limbs  Skin: Skin is warm and dry. Surgical site with honeycomb dressing, drain with minimal drainage.   BLE are improving.   Psychiatric: He has a normal mood and affect. His speech is normal. Thought content normal. Cognition and memory are normal.     Assessment/Plan: 1. Functional deficits secondary to T4 osteomyelitis with cord compression/myelopathy which require 3+ hours per day of interdisciplinary therapy in a comprehensive inpatient rehab setting. Physiatrist is providing close team supervision and 24 hour management of active medical problems listed below. Physiatrist and rehab team continue to assess barriers to discharge/monitor patient progress toward functional and medical goals.  Function:  Bathing Bathing position   Position: Shower  Bathing parts Body parts bathed by patient: Right arm, Left arm, Chest, Abdomen, Front perineal area, Right upper leg, Left upper leg, Right lower leg, Left lower leg, Back, Buttocks Body parts bathed by helper: Buttocks  Bathing assist Assist Level: Supervision or verbal cues      Upper Body Dressing/Undressing Upper body dressing   What is the patient wearing?: Pull over shirt/dress     Pull over shirt/dress - Perfomed by patient: Thread/unthread right sleeve, Thread/unthread left sleeve, Put head through opening, Pull shirt over trunk Pull over shirt/dress - Perfomed by helper: Pull shirt over trunk        Upper body assist Assist Level: No help, No cues  Lower Body Dressing/Undressing Lower body dressing   What is the patient wearing?: Pants, Shoes, Non-skid slipper socks Underwear - Performed by patient: Thread/unthread right underwear leg, Thread/unthread left underwear leg, Pull underwear up/down Underwear - Performed by helper: Pull underwear up/down Pants- Performed  by patient: Thread/unthread right pants leg, Thread/unthread left pants leg, Pull pants up/down Pants- Performed by helper: Thread/unthread right pants leg, Pull pants up/down Non-skid slipper socks- Performed by patient: Don/doff right sock, Don/doff left sock Non-skid slipper socks- Performed by helper: Don/doff right sock, Don/doff left sock   Socks - Performed by helper: Don/doff right sock, Don/doff left sock Shoes - Performed by patient: Don/doff right shoe, Don/doff left shoe Shoes - Performed by helper: Fasten right, Fasten left          Lower body assist Assist for lower body dressing: Touching or steadying assistance (Pt > 75%)      Toileting Toileting Toileting activity did not occur: No continent bowel/bladder event (no bowel, urinal for urine) Toileting steps completed by patient: Adjust clothing prior to toileting, Performs perineal hygiene, Adjust clothing after toileting Toileting steps completed by helper: Adjust clothing prior to toileting, Performs perineal hygiene, Adjust clothing after toileting Toileting Assistive Devices: Grab bar or rail  Toileting assist Assist level: Supervision or verbal cues   Transfers Chair/bed transfer   Chair/bed transfer method: Stand pivot Chair/bed transfer assist level: Supervision or verbal cues Chair/bed transfer assistive device: Walker, Orthosis     Locomotion Ambulation Ambulation activity did not occur: Safety/medical concerns   Max distance: 180 Assist level: Supervision or verbal cues   Wheelchair   Type: Manual Max wheelchair distance: 30 Assist Level: No help, No cues, assistive device, takes more than reasonable amount of time  Cognition Comprehension Comprehension assist level: Understands complex 90% of the time/cues 10% of the time  Expression Expression assist level: Expresses complex 90% of the time/cues < 10% of the time  Social Interaction Social Interaction assist level: Interacts appropriately with  others with medication or extra time (anti-anxiety, antidepressant).  Problem Solving Problem solving assist level: Solves basic problems with no assist  Memory Memory assist level: Recognizes or recalls 90% of the time/requires cueing < 10% of the time   Medical Problem List and Plan: 1. 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 DVT Prophylaxis/Anticoagulation: continue lovenox 3. Pain Management: Continue oxy IR prn. Continue 12.mcg fentanyl patch,  -ice prn  -activity tolerance improving  -hinged neoprene knee brace RLE has been quite beneficial for support. Received second brace also for left  -s/p right knee steroid injection with good results 4. Mood: LCSW to follow for evaluation and support.  5. Neuropsych: This patient is capable of making decisions on his own behalf. 6. Skin/Wound Care: continuelocal care  7. Fluids/Electrolytes/Nutrition: Monitor I/O. ON renal diet with 1200 cc/FR.   -labs with HD 8. ESRD: On HD MWF. Scheduled sessions later in afternoons to help with therapy tolerance.     9. HTN: Monitor BID. Continue Norvasc.   -overall improved 10. Anemia of chronic disease:following up with HD (8.1) 11. DM type 2:    SSI for elevated BS.   -  amaryl increased to 2mg  daily with good results    12. OSA: Continue use of CPAP.  13. Constipation:   -continue lax/soft  -prn sorbitol 14. Leukocytosis: 10.7 yesterday--has ranged from 10-16k over the  last 2 weeks  -labs  with HD  -no change in plan  -ancef with HD per nephrology  LOS (Days) 22 A FACE TO FACE EVALUATION WAS PERFORMED  Seth [name] T 09/08/2015 8:46 AM

## 2015-09-08 NOTE — Progress Notes (Signed)
Occupational Therapy Session Note  Patient Details  Name: Seth [name] MRN: [id number] Date of Birth: 10-18-1965  Today's Date: 09/08/2015 OT Individual Time: 0830-1000 OT Individual Time Calculation (min): 90 min    Short Term Goals: Week 3:  OT Short Term Goal 1 (Week 3): STG=LTG due to LOS  Skilled Therapeutic Interventions/Progress Updates:     Pt seen for OT ADL bathing and dressing session. Pt sitting EOB upon arrival, agreeable to tx session. He self propelled w/[name] to ADL apartment for full showering task using tub bench as he will do at d/[name].  Bathroom transfers including from the toilet and tub transfer bench completed at supervision- mod I level using RW. He bathed seated on tub bench mod I. LB dressing completed via sit <> stands at mod I level. Education provided regarding safe set-up of sit <> stand at [place] as pt stood at side of RW to dress. Sock aid used to don socks. Pt requested removal of elastic shoe laces as he didn't feel feet felt secure with elastic shoe laces.  He returned to room and donned B knee braces independently, supporting LE on bed to reach. Educated pt regarding decreasing bending of back during functional tasks for back support and decrease pain.  Pt educated throughout session regarding safety awareness, energy conservation, and d/[name] planning.   Therapy Documentation Precautions:  Precautions Precautions: Fall, Back Precaution Comments: recalls 3/3 back precautions Required Braces or Orthoses: Other Brace/Splint Other Brace/Splint: bilateral knee braces Restrictions Weight Bearing Restrictions: No Pain: Pain Assessment Pain Assessment: 0-10 Pain Score: 5  Pain Type: Acute pain Pain Location: Back Pain Orientation: Mid;Upper Pain Descriptors / Indicators: Aching;Tightness Pain Onset: Gradual Patients Stated Pain Goal: 2 Pain Intervention(s): Shower;Repositioned Multiple Pain Sites: No  See Function Navigator for Current Functional  Status.   Therapy/Group: Individual Therapy  Seth [name] 09/08/2015, 7:11 AM

## 2015-09-08 NOTE — Progress Notes (Signed)
  Assessment/Plan: 1 ESRD  2 HTN, improving 3 Anemia esa/fe 4 HPTH vit D, cinnalcalcet 5 osteo of spine AB 6 DM controlled 7 obesity  P HD in AM before discharge  Subjective: Interval History: Working with PT  Objective: Vital signs in last 24 hours: Temp:  [97.3 F (36.3 C)-98.3 F (36.8 C)] 97.3 F (36.3 C) (11/29 1335) Pulse Rate:  [74-86] 83 (11/29 1335) Resp:  [18-20] 18 (11/29 1335) BP: (128-178)/(74-96) 139/74 mmHg (11/29 1335) SpO2:  [98 %-100 %] 98 % (11/29 1335) Weight:  [136.4 kg (300 lb 11.3 oz)-141.5 kg (311 lb 15.2 oz)] 136.4 kg (300 lb 11.3 oz) (11/29 0549) Weight change: 3 kg (6 lb 9.8 oz)  Intake/Output from previous day: 11/28 0701 - 11/29 0700 In: 600 [P.O.:600] Out: 4000  Intake/Output this shift: Total I/O In: 240 [P.O.:240] Out: -   Ambulating up and douwn stairsalert and approrpiate  Lab Results:  Recent Labs  09/07/15 1739  WBC 10.7*  HGB 8.6*  HCT 27.0*  PLT 262   BMET:  Recent Labs  09/07/15 1737  NA 125*  K 4.7  CL 88*  CO2 23  GLUCOSE 96  BUN 97*  CREATININE 11.14*  CALCIUM 8.2*   No results for input(s): PTH in the last 72 hours. Iron Studies: No results for input(s): IRON, TIBC, TRANSFERRIN, FERRITIN in the last 72 hours. Studies/Results: No results found.  Scheduled: . amLODipine  10 mg Oral QHS  . aspirin  81 mg Oral Daily  . calcitRIOL  0.5 mcg Oral Q M,W,F  . calcium acetate  2,001 mg Oral TID WC  .  ceFAZolin (ANCEF) IV  2 g Intravenous Q M,W,F-HD  . cinacalcet  30 mg Oral Q breakfast  . darbepoetin (ARANESP) injection - DIALYSIS  200 mcg Intravenous Q Mon-HD  . enoxaparin (LOVENOX) injection  30 mg Subcutaneous Q24H  . fentaNYL  12.5 mcg Transdermal Q72H  . glimepiride  2 mg Oral Q breakfast  . hydrocerin   Topical BID  . insulin aspart  0-15 Units Subcutaneous TID WC  . insulin aspart  0-5 Units Subcutaneous QHS  . multivitamin  1 tablet Oral QHS  . pantoprazole  40 mg Oral QHS  . senna-docusate  2  tablet Oral QHS     LOS: 22 days   Sanskriti Greenlaw C 09/08/2015,4:24 PM

## 2015-09-08 NOTE — Progress Notes (Signed)
Recreational Therapy Session Note  Patient Details  Name: Seth [name] MRN: [id number] Date of Birth: 1966-04-04 Today's Date: 09/08/2015  Pain: no c/o Skilled Therapeutic Interventions/Progress Updates: Pt participated in community reintegration/outing to [employer] for lunch at overall supervision ambulatory level using RW.  Goals/discussion focused on safe mobility during community pursuits, identification and negotiation of obstacles, energy conservation & discharge planning.  See outing goal sheet in shadow chart for full details.  Therapy/Group: [employer]  Sian Rockers 09/08/2015, 4:00 PM

## 2015-09-08 NOTE — Progress Notes (Signed)
Occupational Therapy Discharge Summary  Patient Details  Name: Seth [name] MRN: [id number] Date of Birth: Dec 23, 1965   Patient has met 10 of 10 long term goals due to improved activity tolerance, improved balance, postural control and improved coordination.  Patient to discharge at overall Modified Independent level.  Patient's care partner is independent to provide the necessary physical assistance at discharge.    Recommendation:  Patient with no further OT needs at this time.   Equipment: No equipment provided . Pt already has tub transfer bench. He declined need for 3-1 [facility] and demonstrated ability to complete sit <> stand to standard tolet seat.   Reasons for discharge: treatment goals met and discharge from hospital  Patient/family agrees with progress made and goals achieved: Yes  OT Discharge Precautions/Restrictions  Precautions Precautions: Fall;Back Precaution Booklet Issued: Yes (comment) Precaution Comments: recalls 3/3 back precautions Required Braces or Orthoses: Other Brace/Splint Other Brace/Splint: bilateral knee braces Restrictions Weight Bearing Restrictions: No Vision/Perception  Vision- History Baseline Vision/History: No visual deficits Patient Visual Report: No change from baseline  Cognition Overall Cognitive Status: Within Functional Limits for tasks assessed Arousal/Alertness: Awake/alert Orientation Level: Oriented X4 Focused Attention: Appears intact Sustained Attention: Appears intact Problem Solving: Appears intact Safety/Judgment: Appears intact Sensation Sensation Light Touch: Appears Intact Coordination Gross Motor Movements are Fluid and Coordinated: Yes Fine Motor Movements are Fluid and Coordinated: Yes Motor  Motor Motor: Within Functional Limits Motor - Discharge Observations: Pt with limitations due to body habitus and general deconditioning.    Trunk/Postural Assessment  Cervical Assessment Cervical Assessment: Within  Functional Limits Thoracic Assessment Thoracic Assessment:  (Flexed posture; back precautions) Lumbar Assessment Lumbar Assessment: Exceptions to [facility] (Back precautions) Postural Control Postural Control: Deficits on evaluation (Improved since admission; uses UE support for upright posture)  Balance Balance Balance Assessed: Yes Static Sitting Balance Static Sitting - Level of Assistance: 6: Modified independent (Device/Increase time) Dynamic Sitting Balance Dynamic Sitting - Level of Assistance: 6: Modified independent (Device/Increase time) Static Standing Balance Static Standing - Level of Assistance: 6: Modified independent (Device/Increase time) Dynamic Standing Balance Dynamic Standing - Level of Assistance: 6: Modified independent (Device/Increase time) Extremity/Trunk Assessment RUE Assessment RUE Assessment: Within Functional Limits RUE AROM (degrees) Overall AROM Right Upper Extremity: Within functional limits for tasks performed RUE Strength RUE Overall Strength: Within Functional Limits for tasks performed LUE Assessment LUE Assessment: Within Functional Limits LUE AROM (degrees) Overall AROM Left Upper Extremity: Within functional limits for tasks assessed LUE Strength LUE Overall Strength: Within Functional Limits for tasks assessed   See Function Navigator for Current Functional Status.  Lewis, Radha Coggins C 09/08/2015, 3:14 PM

## 2015-09-08 NOTE — Progress Notes (Signed)
Patient refused CPAP for the night. RT will continue to monitor as needed  

## 2015-09-09 LAB — RENAL FUNCTION PANEL
ALBUMIN: 3.4 g/dL — AB (ref 3.5–5.0)
Anion gap: 13 (ref 5–15)
BUN: 81 mg/dL — AB (ref 6–20)
CALCIUM: 8.6 mg/dL — AB (ref 8.9–10.3)
CO2: 25 mmol/L (ref 22–32)
CREATININE: 9.22 mg/dL — AB (ref 0.61–1.24)
Chloride: 94 mmol/L — ABNORMAL LOW (ref 101–111)
GFR, EST AFRICAN AMERICAN: 7 mL/min — AB (ref >60)
GFR, EST NON AFRICAN AMERICAN: 6 mL/min — AB (ref >60)
Glucose, Bld: 138 mg/dL — ABNORMAL HIGH (ref 65–99)
Phosphorus: 5.1 mg/dL — ABNORMAL HIGH (ref 2.5–4.6)
Potassium: 5.3 mmol/L — ABNORMAL HIGH (ref 3.5–5.1)
SODIUM: 132 mmol/L — AB (ref 135–145)

## 2015-09-09 LAB — GLUCOSE, CAPILLARY
GLUCOSE-CAPILLARY: 109 mg/dL — AB (ref 65–99)
GLUCOSE-CAPILLARY: 189 mg/dL — AB (ref 65–99)

## 2015-09-09 MED ORDER — HYDROCERIN EX CREA: 1.0000 "application " | TOPICAL_CREAM | Freq: Two times a day (BID) | CUTANEOUS | Status: DC

## 2015-09-09 MED ORDER — DARBEPOETIN ALFA 100 MCG/0.5ML IJ SOSY
200.0000 ug | PREFILLED_SYRINGE | INTRAMUSCULAR | Status: DC
Start: 2015-09-09 — End: 2018-05-31

## 2015-09-09 MED ORDER — RENA-VITE PO TABS: 1.0000 | ORAL_TABLET | Freq: Every day | ORAL | Status: DC

## 2015-09-09 MED ORDER — GLIMEPIRIDE 2 MG PO TABS: 2.0000 mg | ORAL_TABLET | Freq: Every day | ORAL | Status: DC

## 2015-09-09 MED ORDER — OXYCODONE HCL 10 MG PO TABS: 5.0000 mg | ORAL_TABLET | Freq: Two times a day (BID) | ORAL | Status: DC | PRN

## 2015-09-09 MED ORDER — SENNOSIDES-DOCUSATE SODIUM 8.6-50 MG PO TABS: 2.0000 | ORAL_TABLET | Freq: Every day | ORAL | Status: DC

## 2015-09-09 NOTE — Progress Notes (Signed)
Discharge Note  The overall goal for the admission was met for:   Discharge location: Yes - home with family to assist  Length of Stay: Yes - 23 days  Discharge activity level: Yes - modified independent  Home/community participation: Yes  Services provided included: MD, RD, PT, OT, RN, TR, Pharmacy and SW  Financial Services: Medicare and Other: [employer]  Follow-up services arranged: Home Health: RN, PT, OT via [facility], DME: 22x18 wheelchair, cushion, wide rolling walker via [facility] and Patient/Family has no preference for HH/DME agencies  Comments (or additional information):  Patient/Family verbalized understanding of follow-up arrangements: Yes  Individual responsible for coordination of the follow-up plan: pt  Confirmed correct DME delivered: HOYLE, LUCY 09/09/2015    HOYLE, LUCY 

## 2015-09-09 NOTE — Discharge Instructions (Addendum)
Inpatient Rehab Discharge Instructions  Seth [name] Discharge date and time: 09/09/15   Activities/Precautions/ Functional Status: Activity: activity as tolerated Diet: diabetic diet and renal diet 1200 fluid/day Wound Care: keep wound clean and dry   Functional status:  ___ No restrictions     ___ Walk up steps independently ___ 24/7 supervision/assistance   ___ Walk up steps with assistance _X__ Intermittent supervision/assistance  _X__ Bathe/dress independently ___ Walk with walker     ___ Bathe/dress with assistance ___ Walk Independently    ___ Shower independently ___ Walk with assistance    ___ Shower with assistance _X__ No alcohol     ___ Return to work/school ________     COMMUNITY REFERRALS UPON DISCHARGE:    Home Health:   PT     OT                     Agency:  [facility] Phone:  [phone]   Medical Equipment/Items Ordered:  Wheelchair and walker                                                      Agency/Supplier:  [facility] @ [phone]        Special Instructions: 1. Check blood sugars twice a day before meals and record.    My questions have been answered and I understand these instructions. I will adhere to these goals and the provided educational materials after my discharge from the hospital.  Patient/Caregiver Signature _______________________________ Date __________  Clinician Signature _______________________________________ Date __________  Please bring this form and your medication list with you to all your follow-up doctor's appointments.

## 2015-09-09 NOTE — Consult Note (Signed)
   [facility] CM Inpatient Consult   09/09/2015  Seth [name] [birth year] [id number] Patient was assessed for [facility] for community services. Patient was previously active with [facility].  Met with patient at bedside regarding being restarted with [facility] services. Patient is being discharge from inpatient rehabilitation today.  He just finished his hemodialysis treatment today.  He remains on a Monday, Wednesday and Friday schedule.  Patient wants to continue services with [facility].  He did express some concerns regarding his medications when he returns home.  Will have care manager follow up, as well.   Of note, [facility] Care Management services does not replace or interfere with any services that are arranged by inpatient case management or social work. For additional questions or referrals please contact: Natividad Brood, RN BSN [facility] Liaison  [phone] business mobile phone

## 2015-09-09 NOTE — Progress Notes (Signed)
[place] PHYSICAL MEDICINE & REHABILITATION     PROGRESS NOTE    Subjective/Complaints: Sitting EOB waiting to go to HD when i arrived. Feels prepared to go home..   ROS: Pt denies fever, rash/itching, headache, blurred or double vision, nausea, vomiting, abdominal pain, diarrhea, chest pain, shortness of breath, palpitations, dysuria, dizziness, bleeding, anxiety, or depression   Objective: Vital Signs: Blood pressure 128/81, pulse 86, temperature 97.8 F (36.6 C), temperature source Oral, resp. rate 20, weight 134.6 kg (296 lb 11.8 oz), SpO2 98 %. No results found.  Recent Labs  09/07/15 1739  WBC 10.7*  HGB 8.6*  HCT 27.0*  PLT 262    Recent Labs  09/07/15 1737  NA 125*  K 4.7  CL 88*  GLUCOSE 96  BUN 97*  CREATININE 11.14*  CALCIUM 8.2*   CBG (last 3)   Recent Labs  09/08/15 1636 09/08/15 2038 09/09/15 0642  GLUCAP 170* 157* 109*    Wt Readings from Last 3 Encounters:  09/09/15 134.6 kg (296 lb 11.8 oz)  08/17/15 136.8 kg (301 lb 9.4 oz)  08/10/15 139.254 kg (307 lb)    Physical Exam:  Constitutional: He is oriented to person, place, and time. He appears well-developed and well-nourished.  Morbidly obese male . nad  HENT:  Head: Normocephalic and atraumatic.  Eyes: Conjunctivae and EOM are normal. Pupils are equal, round, and reactive to light.  Neck: Normal range of motion. Neck supple.   Cardiovascular: Normal rate and regular rhythm. systolic murmur Respiratory: Effort normal and breath sounds normal. No respiratory distress. He has no wheezes.  GI: Soft. Bowel sounds are normal. He exhibits no distension. There is no tenderness.   Musculoskeletal: right knee with much less pain during AROM/PROM Neuro: Strength B/l UE 4+/5 throughout LLE hip flexion 4/5, ankle dorsi/plantar flexion 4+/5 RLE: hip flexion 4-/5, ankle dorsi/plantar flexion 4+/5  He is alert and oriented to person, place, and time.  Speech clear.  Able to follow commands  without difficulty.  He is alert and oriented to person, place, and time.  He has normal reflexes.  Intentional tremors BUE. Sensation intact to light touchin all 4 limbs  Skin: Skin is warm and dry.  Surgical site intact.       Psychiatric: He has a normal mood and affect. His speech is normal. Thought content normal. Cognition and memory are normal.     Assessment/Plan: 1. Functional deficits secondary to T4 osteomyelitis with cord compression/myelopathy which require 3+ hours per day of interdisciplinary therapy in a comprehensive inpatient rehab setting. Physiatrist is providing close team supervision and 24 hour management of active medical problems listed below. Physiatrist and rehab team continue to assess barriers to discharge/monitor patient progress toward functional and medical goals.  Function:  Bathing Bathing position   Position: Shower  Bathing parts Body parts bathed by patient: Right arm, Left arm, Chest, Abdomen, Front perineal area, Right upper leg, Left upper leg, Right lower leg, Left lower leg, Back, Buttocks Body parts bathed by helper: Buttocks  Bathing assist Assist Level: More than reasonable time      Upper Body Dressing/Undressing Upper body dressing   What is the patient wearing?: Pull over shirt/dress     Pull over shirt/dress - Perfomed by patient: Thread/unthread right sleeve, Thread/unthread left sleeve, Put head through opening, Pull shirt over trunk Pull over shirt/dress - Perfomed by helper: Pull shirt over trunk        Upper body assist Assist Level: No help, No cues  Lower Body Dressing/Undressing Lower body dressing   What is the patient wearing?: Pants, Shoes, Non-skid slipper socks, Underwear Underwear - Performed by patient: Thread/unthread right underwear leg, Thread/unthread left underwear leg, Pull underwear up/down Underwear - Performed by helper: Pull underwear up/down Pants- Performed by patient: Thread/unthread right  pants leg, Thread/unthread left pants leg, Pull pants up/down Pants- Performed by helper: Thread/unthread right pants leg, Pull pants up/down Non-skid slipper socks- Performed by patient: Don/doff right sock, Don/doff left sock Non-skid slipper socks- Performed by helper: Don/doff right sock, Don/doff left sock   Socks - Performed by helper: Don/doff right sock, Don/doff left sock Shoes - Performed by patient: Don/doff right shoe, Don/doff left shoe Shoes - Performed by helper: Fasten right, Fasten left          Lower body assist Assist for lower body dressing: More than reasonable time Assistive Device Comment: Conservation officer, historic buildings activity did not occur: No continent bowel/bladder event (no bowel, urinal for urine) Toileting steps completed by patient: Adjust clothing prior to toileting, Performs perineal hygiene, Adjust clothing after toileting Toileting steps completed by helper: Adjust clothing prior to toileting, Performs perineal hygiene, Adjust clothing after toileting Toileting Assistive Devices: Grab bar or rail  Toileting assist Assist level: More than reasonable time   Transfers Chair/bed transfer   Chair/bed transfer method: Stand pivot Chair/bed transfer assist level: No Help, no cues, assistive device, takes more than a reasonable amount of time Chair/bed transfer assistive device: Walker, Orthosis     Locomotion Ambulation Ambulation activity did not occur: Safety/medical concerns   Max distance: 180 Assist level: Supervision or verbal cues   Wheelchair   Type: Manual Max wheelchair distance: 150 Assist Level: No help, No cues, assistive device, takes more than reasonable amount of time  Cognition Comprehension Comprehension assist level: Follows complex conversation/direction with extra time/assistive device  Expression Expression assist level: Expresses complex ideas: With extra time/assistive device  Social Interaction Social  Interaction assist level: Interacts appropriately with others with medication or extra time (anti-anxiety, antidepressant).  Problem Solving Problem solving assist level: Solves complex 90% of the time/cues < 10% of the time  Memory Memory assist level: More than reasonable amount of time   Medical Problem List and Plan: 1. Functional deficits secondary to Cord compression secondary to Osteomyelitis/diskitis T4 and T5 with pathologic fracture, able to move both LEs  -abx with HD--for another 2+ weeks  -dc home today after HD  -follow up with nephrology, surgery, me as outpt, HH therapies 2. DVT Prophylaxis/Anticoagulation: continue lovenox 3. Pain Management: Continue oxy IR prn. Continue 12.mcg fentanyl patch---can dc fentanyl patch at discharge  -ice prn  -activity tolerance improving  -hinged neoprene knee brace RLE has been quite beneficial for support. Received second brace also for left  -s/p right knee steroid injection with good results 4. Mood: LCSW to follow for evaluation and support.  5. Neuropsych: This patient is capable of making decisions on his own behalf. 6. Skin/Wound Care: continuelocal care  7. Fluids/Electrolytes/Nutrition: Monitor I/O. ON renal diet with 1200 cc/FR.   -labs with HD 8. ESRD: On HD MWF. Scheduled sessions later in afternoons to help with therapy tolerance.     9. HTN: Monitor BID. Continue Norvasc.   -overall improved 10. Anemia of chronic disease:following up with HD (8.1) 11. DM type 2:    SSI for elevated BS.   -  amaryl increased to 2mg  daily with good results--continue at home    12. OSA:  Continue use of CPAP.  13. Constipation:   -continue lax/soft  -prn sorbitol 14. Leukocytosis: 10.7 yesterday--has ranged from 10-16k over the last 2 weeks  -labs  with HD  -no change in plan  -ancef with HD per nephrology  LOS (Days) 23 A FACE TO FACE EVALUATION WAS PERFORMED  Shermika Balthaser T 09/09/2015 8:37 AM

## 2015-09-09 NOTE — Patient Care Conference (Signed)
Inpatient RehabilitationTeam Conference and Plan of Care Update Date: 09/08/2015   Time: 2:00 PM    Patient Name: Seth [name]      Medical Record Number: [id number]  Date of Birth: Apr 13, 1966 Sex: Male         Room/Bed: 4W24C/4W24C-01 Payor Info: Payor: MEDICARE / Plan: MEDICARE PART A AND B / Product Type: *No Product type* /    Admitting Diagnosis: T4-5 cord compression with osteomyelitis diskitis   Admit Date/Time:  08/17/2015  5:57 PM Admission Comments: No comment available   Primary Diagnosis:  Cord compression myelopathy ([name]) Principal Problem: Cord compression myelopathy [facility])  Patient Active Problem List   Diagnosis Date Noted  . Osteoarthritis of right knee 09/01/2015  . Primary osteoarthritis of right knee   . 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 Osteomyelitis of thoracic spine ([place]) 08/16/2015  . Discitis of thoracic region   . Septic shock ([place]) 08/13/2015  . Acute respiratory failure with hypoxia ([place]) 08/13/2015  . Cord compression ([place]) 08/11/2015  . Back pain 08/11/2015  . Thoracic spine pain 08/11/2015  . Muscle strain of chest wall 07/21/2015  . Controlled type 2 diabetes mellitus with chronic kidney disease on chronic dialysis ([place]) 07/20/2015  . 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 Absolute anemia   . 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 OSA (obstructive sleep apnea) 10/14/2013  . Tracheostomy status ([place]) 09/17/2013  . Difficult airway for intubation 08/20/2013  . End stage renal disease on dialysis ([place]) 04/30/2012  . Hypocalcemia 04/30/2012  . Normocytic anemia 04/30/2012  . DM II (diabetes mellitus, type II), controlled ([place]) 06/20/2007  . DYSLIPIDEMIA 06/20/2007  . Morbid obesity ([place]) 06/20/2007  . Essential hypertension 06/20/2007    Expected Discharge Date: Expected Discharge Date: 09/09/15  Team Members Present: Physician leading conference: Dr. Alger Simons Social Worker Present: Lennart Pall,  LCSW Nurse Present: Dorien Chihuahua, RN PT Present: Raylene Everts, PT;Rebecca Jari Favre, PT OT Present: Roanna Epley, COTA SLP Present: Weston Anna, SLP PPS Coordinator present : Daiva Nakayama, RN, CRRN     Current Status/Progress Goal Weekly Team Focus  Medical   continued improvement, bp a little high, pain controlled dm control better also  improve knee rom and activity tolerance  pain control, finalize dc planning   Bowel/Bladder   continent of bowel and bladder. Pt oliguric; HD m/w/f  Mod I  conitune POC with bowel meds   Swallow/Nutrition/ Hydration             ADL's   Supervision- mod I functional transfers; mod I seated bathing  Supervision - mod I  D/c planning Tx goals met; d/c from OT   Mobility   supervision overall  supervision to modified independent overall; min assist for stairs  finalizing d/c planning, endurance, strengthening, community mobility, stair negotiation, gait   Communication             Safety/Cognition/ Behavioral Observations            Pain   oxycodone 5-54m Q4hrs PRN  less than 4  assess pain and medicate as needed. assess effectiveness   Skin              Rehab Goals Patient on target to meet rehab goals: Yes *See Care Plan and progress notes for long and short-term goals.  Barriers to Discharge: see prior    Possible Resolutions to Barriers:  see prior, ready for dc tomorrow    Discharge Planning/Teaching Needs:  Home with family  to provide up to 24/7 assistance.      Team Discussion:  Pt is ready for d/c tomorrow.  Had successful outing today.  Mod i goals met.  Revisions to Treatment Plan:  NA   Continued Need for Acute Rehabilitation Level of Care: The patient requires daily medical management by a physician with specialized training in physical medicine and rehabilitation for the following conditions: Daily direction of a multidisciplinary physical rehabilitation program to ensure safe treatment while eliciting the highest outcome  that is of practical value to the patient.: Yes Daily medical management of patient stability for increased activity during participation in an intensive rehabilitation regime.: Yes Daily analysis of laboratory values and/or radiology reports with any subsequent need for medication adjustment of medical intervention for : Neurological problems;Other;Post surgical problems  Merryl Buckels 09/09/2015, 12:37 PM

## 2015-09-09 NOTE — Procedures (Signed)
Tolerating hemodialysis. Large volume attempted to be removed.  Stable hemodynamics.  Will return to home unit upon discharge today. Seth [name] C

## 2015-09-09 NOTE — Progress Notes (Signed)
Recreational Therapy Discharge Summary Patient Details  Name: Seth [name] MRN: [id number] Date of Birth: Jun 26, 1966  Today's Date: 09/09/2015  Long term goals set: 2  Long term goals met: 2  Comments on progress toward goals: Pt has made excellent progress toward goals and is ready for discharge home today with Mod I for TR tasks seated, supervision-mod I level for standing activities and supervision for community reintegration.  Education provided on energy conservations, use of leisure time post discharge, community resources Paramedic).  Pt is anxious to return home and work towards greater independence.  Reasons for discharge: discharge from hospital   Patient/family agrees with progress made and goals achieved: Yes  [name] 09/09/2015, 11:02 AM

## 2015-09-09 NOTE — Discharge Summary (Signed)
Physician Discharge Summary  [name] MRN: [id number] DOB/AGE: Feb 04, 1966 49 y.o.  Admit date: 08/17/2015 Discharge date: 09/09/2015  Discharge Diagnoses:  Principal Problem:   Cord compression myelopathy [facility]) Active Problems:   Morbid obesity ([place])   Essential hypertension   End stage renal disease on dialysis [facility])   Controlled type 2 diabetes mellitus with chronic kidney disease on chronic dialysis ([place])   Discitis of thoracic region   Osteomyelitis of thoracic spine ([name])   Primary osteoarthritis of right knee   Osteoarthritis of right knee   Discharged Condition:  Stable.    Labs:  Basic Metabolic Panel:  Recent Labs Lab 09/07/15 1737 09/09/15 0812  NA 125* 132*  K 4.7 5.3*  CL 88* 94*  CO2 23 25  GLUCOSE 96 138*  BUN 97* 81*  CREATININE 11.14* 9.22*  CALCIUM 8.2* 8.6*  PHOS 4.3 5.1*    CBC: CBC Latest Ref Rng 09/07/2015 09/01/2015 08/30/2015  WBC 4.0 - 10.5 K/uL 10.7(H) 16.7(H) 10.9(H)  Hemoglobin 13.0 - 17.0 g/dL 8.6(L) 8.7(L) 8.6(L)  Hematocrit 39.0 - 52.0 % 27.0(L) 27.1(L) 27.9(L)  Platelets 150 - 400 K/uL 262 376 416(H)     CBG:  Recent Labs Lab 09/08/15 0652 09/08/15 1636 09/08/15 2038 09/09/15 0642 09/09/15 1338  GLUCAP 101* 170* 157* 109* 189*    Brief HPI:   Seth [name] is a 49 y.o. male with history of DM type 2, ESRD, OSA, recent Staph lugdunensis treated with 2 weeks of IV antibiotics. He was admitted on 08/10/15 with three week history of upper back pain progressing to BLE weakness with tingling and difficulty walking. MRI thoracic spine done revealing discitis-osteomyelitis at T4-5 with endplate destructive changes and vertebral body height loss and retropulsion of inferior margin of T4 and superior margin of T5 into spinal canal with moderate stenosis and cord compression. Patient was taken to OR on 11/02 for T4-T5 decompressive laminectomy with T4 and T5 transpedicular corpectomy and arthrodesis by Dr. Annette Stable.  Did not tolerate extubation in PACU requiring re-intubation and placed on Cefzolin for 6 weeks IV therapy per ID input. He tolerated extubation without difficulty on 11/03.  He had weakness in his RLE post-op and had difficulty with ability to carry out selfcare tasks as well as mobility. CIR was recommended for follow up therapy.    Hospital Course: Seth [name] was admitted to rehab 08/17/2015 for inpatient therapies to consist of PT and OT at least three hours five days a week. Past admission physiatrist, therapy team and rehab RN have worked together to provide customized collaborative inpatient rehab. Blood pressures have been stable and he was afebrile during his rehab stay. Serial check of CBC shows white count has decreased to 10.7.  HD has been ongoing at the end of the day to help with tolerance of therapy.  He is to continue on IV ancef with HD for 2 additional weeks to complete his antibiotic regimen.  Back incision has healed well without signs or symptoms of infection. Back pain has gradually improved and fentanyl patch was discontinued with recommendations to wean off oxycodone over next few weeks.  Mood, anxiety levels and outlook has greatly improved by discharge.  Po intake has been good and constipation was managed with use of Senna S at bedtime.  Diabetes has been managed on ac/hs basis and blood sugars were noted to be poorly controlled. He was started on Amaryl which was titrated to 2 mg daily. He was advised to continue blood  sugars on bid basis and follow up with primary MD for further adjustment after discharge. He was fitted with right knee hinged neoprene braces for support of bilateral knees with good results. Right knee was also injected with steroids to help with symptoms. He has made steady progress and was modified independent at discharge. He will continue to receive follow HHPT and HHOT by [facility] after discharge.    Rehab course: During patient's stay in  rehab weekly team conferences were held to monitor patient's progress, set goals and discuss barriers to discharge. At admission, patient required max assist with ADL tasks and +2 assist with mobility. He has had improvement in activity tolerance, balance, postural control, as well as ability to compensate for deficits. He is able to complete ADL tasks independently.  He is modified independent for transfers and for ambulating short distances in supervised setting. He requires supervision for ambulating 180' with RW.     Disposition: 01-Home or Self Care  Diet: Renal diet/diabetic restrictions.   Special Instructions: 1. Ancef to continue thorough 09/24/15   Discharge Instructions    AMB Referral to [place] Management    Complete by:  As directed   Reason for consult:  Restart services with [facility] Care Management  Diagnoses of:  Kidney Failure  Expected date of contact:  1-3 days (reserved for hospital discharges)  Please assign to community nurse for restart of services and transition of care calls and assess for home visits. Please check on patient medications he states he may need medication assistance until he gets his medications from the [employer].  Questions please call:  Natividad Brood, RN BSN [facility] Liaison  [phone] business mobile phone     Ambulatory referral to Physical Medicine Rehab    Complete by:  As directed   Follow up in 6 weeks--needs to be Tue/thur--cord compression/myelopathy            Medication List    STOP taking these medications        gabapentin 300 MG capsule  Commonly known as:  NEURONTIN     traMADol 50 MG tablet  Commonly known as:  ULTRAM     VITAMIN D PO      TAKE these medications        albuterol (2.5 MG/3ML) 0.083% nebulizer solution  Commonly known as:  PROVENTIL  Take 3 mLs (2.5 mg total) by nebulization every 6 (six) hours as needed for wheezing or shortness of breath.     amLODipine 10 MG  tablet  Commonly known as:  NORVASC  Take 1 tablet (10 mg total) by mouth daily.     aspirin EC 81 MG tablet  Take 81 mg by mouth daily.     calcitRIOL 0.5 MCG capsule  Commonly known as:  ROCALTROL  Take 1 capsule (0.5 mcg total) by mouth every Monday, Wednesday, and Friday.     calcium acetate 667 MG capsule  Commonly known as:  PHOSLO  Take 3 capsules (2,001 mg total) by mouth 3 (three) times daily with meals.     ceFAZolin 2-3 GM-% Solr  Commonly known as:  ANCEF  Inject 50 mLs (2 g total) into the vein every Monday, Wednesday, and Friday with hemodialysis.  Notes to Patient:  To continue till 09/24/15     cinacalcet 30 MG tablet  Commonly known as:  SENSIPAR  Take 1 tablet (30 mg total) by mouth daily with breakfast.     cyclobenzaprine 10  MG tablet  Commonly known as:  FLEXERIL  Take 1 tablet (10 mg total) by mouth 3 (three) times daily as needed for muscle spasms.     Darbepoetin Alfa 100 MCG/0.5ML Sosy injection  Commonly known as:  ARANESP  Inject 1 mL (200 mcg total) into the vein every Monday with hemodialysis.     glimepiride 2 MG tablet  Commonly known as:  AMARYL  Take 1 tablet (2 mg total) by mouth daily with breakfast.     hydrocerin Crea  Apply 1 application topically 2 (two) times daily.     insulin aspart 100 UNIT/ML injection  Commonly known as:  novoLOG  Inject 0-15 Units into the skin 3 (three) times daily with meals. CBG < 70: drink orange juice and recheck glucose or use glucose tabs CBG > 400: call MD   CBG 70 - 120: 0 units CBG 121 - 150: 3 units CBG 151 - 200: 4 units CBG 201 - 250: 7 units CBG 251 - 300: 11 units CBG 301 - 350: 15 units CBG 351 - 400: 20 units     multivitamin Tabs tablet  Take 1 tablet by mouth at bedtime.     Oxycodone HCl 10 MG Tabs--Rx #60  Take 0.5-1 tablets (5-10 mg total) by mouth 2 (two) times daily as needed.     pantoprazole 40 MG tablet  Commonly known as:  PROTONIX  Take 1 tablet (40 mg total) by mouth at  bedtime.     senna-docusate 8.6-50 MG tablet  Commonly known as:  Senokot-S  Take 2 tablets by mouth at bedtime.           Follow-up Information    Follow up with Zada Finders, MD On 09/30/2015.   Specialty:  Internal Medicine   Why:  @ 2:15 pm for hospital follow up appt   Contact information:   [place] [address] [phone]       Follow up with Meredith Staggers, MD.   Specialty:  Physical Medicine and Rehabilitation   Why:  office will call you with appointment   Contact information:   [address], [address] [place] [address] [phone]       Follow up with Charlie Pitter, MD. Call today.   Specialty:  Neurosurgery   Why:  for follow up appointment   Contact information:   [address] [place] [address] [phone]       Follow up with Scharlene Gloss, MD On 10/13/2015.   Specialty:  Infectious Diseases   Why:  Be there at 2 pm for follow up appointment   Contact information:   [place] [place]  [address] [phone]       Signed: Bary Leriche 09/09/2015, 4:18 PM

## 2015-09-10 ENCOUNTER — Other Ambulatory Visit: Payer: Self-pay

## 2015-09-10 DIAGNOSIS — I10 Essential (primary) hypertension: Secondary | ICD-10-CM

## 2015-09-10 NOTE — Progress Notes (Signed)
Pt. d'ced to home accompanied by daughter.  Verbalizes understanding of d/c instructions given by [facility] Pam Love.  CBG prior to D/C 115, denies pain at present, VSS.

## 2015-09-10 NOTE — Patient Outreach (Signed)
[place] [facility]) Care Management  09/10/2015  RAMELLO COWICK [birth year] [id number]   Telephone outreach to patient to check the status of the extra help application submitted. Patient states while he was in the hospital someone was messing through his mail and he didn't receive anything from social security. I checked the Medicare website and it shows patient was approved for full subsidy extra help. Once his Medicare Part D plan starts in 2017, his prescription drug coverage will be at a reduced cost. I am happy to assist in any future pharmacy assistance needs. I am removing myself from the care team.  Beryle Bagsby L. Williams [place], [place] Care Management Assistant

## 2015-09-11 NOTE — Patient Outreach (Signed)
Patient discharged from [place] to home on 11/30 Patient answered telephone and was agreeable to this transition of care call after identifying himself by providing date of birth and address.   Patient reports having all his medications and has the support of his sister to assist him by cooking and cleaning.  This RNCM and patient reviewed care plan, updated to reflect his post acute care discharge   Plan: Home visit on 09/16/2015

## 2015-09-15 ENCOUNTER — Other Ambulatory Visit: Payer: Self-pay | Admitting: Licensed Clinical Social Worker

## 2015-09-15 NOTE — Patient Outreach (Signed)
[name]) Care Management  09/15/2015  Seth [name] CLUSTER 02-Dec-1965 [id number]   Assessment-CSW completed outreach to patient on 09/14/15 after receiving referral and was able to successfully reach him. HIPPA verifications provided. However, patient reported that he needed to get off of his phone and will contact CSW back on a different number. CSW did not hear back from patient. CSW completed an additional outreach on 09/15/15 but was unable to reach him. CSW left a HIPPA compliant voice message encouraging patient to return call once available.  Plan-CSW will complete second outreach in one week if return call has not been placed.  Seth [name], Seth [name], Seth [name], [name] Phone: [phone] Fax: [phone]

## 2015-09-15 NOTE — Patient Outreach (Addendum)
[place]) Care Management  09/15/2015  GRADIE TRUMPY 11/27/1965 [id number]   Assessment-CSW received call back from patient on 09/15/15. CSW introduced self, reason for call and of [place] services. Patient agreeable to social work services but reports only having one main social issue which is financial troubles. Patient shares that his sister is supportive and has been assisting him since his discharge. Patient states that he has been eating regularly. Patient reports that he has not been getting along with significant other and that he has "kicked her out because she was causing me so much trouble." Patient reports that she had used a lot of his finances which has caused him financial difficulties. Patient agreeable to CSW mailing out financial assistance resources and community resource guide for seniors to his address. Patient states "I can handle my bills but it will be hard." Patient reports no depressive feelings of symptoms and no substance usage. Patient communicates that he has not completed an advance directive and is unsure if he would like. However, patient is agreeable to [place] mailing out an advance directive for him to review as well. Patient reports that his mains concerns are that physical therapy has not started yet and his c pap machine broke last night. CSW informed patient that she will update RNCM. CSW suggested that patient contact the agency that he received c pap machine and inform them of it needed to be replaced. CSW will contact patient in two weeks to follow up on advance directives and financial assistance resources.  Plan-CSW contacted RNCM Erenest Rasher and updated her on physical therapy and C pap machine. CSW will send message through in basket to Care Management Assistant in order for advance directive and financial assistance resources to be mailed to his residence.  Eula Fried, BSW, MSW, [email or web address] Phone: [phone] Fax: [phone]

## 2015-09-16 NOTE — Patient Outreach (Signed)
[place] [facility]) Care Management  09/16/2015  Seth [name] 1965-11-20 [id number]   Received request from Eula Fried, LCSW to mail patient information on financial assistance resources, advanced directives, and a Health and safety inspector. Mailed information on 09/16/15.  Jacqulynn Cadet  [facility] Care Management Assistant

## 2015-09-17 ENCOUNTER — Other Ambulatory Visit: Payer: Self-pay

## 2015-09-18 ENCOUNTER — Telehealth: Payer: Self-pay

## 2015-09-18 NOTE — Telephone Encounter (Signed)
Barbara Ring-[facility]-called to request verbal orders for pt to start PT and OT on Tuesday 09/22/15. Called and left message for Pamala Hurry to approve verbal orders.

## 2015-09-18 NOTE — Patient Outreach (Signed)
[facility]) Care Management  09/17/2015    Seth [name] 04/20/1966 [id number]  Successful contact made with patient for transition of care. Patient identified himself using HIPPA identiifers by providing date of birth and address.  Patient states he has made contact with some community resources from the list given to him by Eula Fried, LCSW in hopes of finding resources to prevent foreclosure of his house.  Patient states his income is about $1000 per month, states he has been approved for medicaid. Patient advised that if he is approved for medicaid, he is eligible for a  Program through [facility] Program. Patient is a English as a second language teacher and is eligible for benefits through the [employer].  He uses these services for his medications.  Patient states he is advised to get connected with the [employer] in [place], however, because of his recent hospitalizations he has not completed this process yet.    Plan: Home visit next week for continued assessment of community care coordination needs.  Obtain information on medicaid eligibility and for [facility] Program.

## 2015-09-21 ENCOUNTER — Telehealth: Payer: Self-pay | Admitting: Internal Medicine

## 2015-09-21 ENCOUNTER — Other Ambulatory Visit: Payer: Self-pay | Admitting: Pharmacist

## 2015-09-21 NOTE — Patient Outreach (Signed)
Per 09/10/15 encounter from Care Management Assistant Damita Rhodie, per the Medicare.gov website patient was approved for full subsidy extra help. Patient notified of this status by Care Management Assistant during this encounter.  Will close pharmacy episode at this time.  Harlow Asa, PharmD Clinical Pharmacist [place] Management [phone]

## 2015-09-21 NOTE — Telephone Encounter (Signed)
Liji from [facility] called requesting the nurse to call back.

## 2015-09-21 NOTE — Telephone Encounter (Signed)
Per Liji, pt is not home bound, therefore she cannot bill for services.  She recommends OP therapy.  Pt agreeable per Liji.  Is referral to OP PT appropriate?

## 2015-09-21 NOTE — Telephone Encounter (Signed)
Yes, that is appropriate. Thank you

## 2015-09-22 ENCOUNTER — Ambulatory Visit: Payer: Self-pay

## 2015-09-23 ENCOUNTER — Other Ambulatory Visit: Payer: Self-pay | Admitting: Licensed Clinical Social Worker

## 2015-09-23 NOTE — Patient Outreach (Signed)
[name]) Care Management  09/23/2015  Seth [name] 29-Aug-1966 [id number]   Assessment-CSW completed outreach to patient on 09/23/15. Patient shares that "for the most part all of my issues are resolved. I just need to catch up on my bills." CSW questioned updates on the foreclosure of his house as previously documented by [facility]. Patient shares that his house will not be foreclosed and that he does not wish to obtain housing resources. Patient reports that it is not a good time to talk at this moment but is agreeable to CSW making contact next week to review the community resources and financial assistance resources over the phone as patient has confirmed that he received these successfully by mail.  Plan-CSW will complete outreach on 10/01/15 to review these resources.  Eula Fried, BSW, MSW, [email or web address] Phone: [phone] Fax: [phone]

## 2015-09-24 ENCOUNTER — Other Ambulatory Visit: Payer: Self-pay

## 2015-09-24 NOTE — Patient Outreach (Signed)
[place] [facility]) Care Management  09/24/2015  Seth [name] 06-11-1966 [id number]      Patient and RNCM discussed his HgbA1C was 8.0 on 9/23.  Patient educated on ways to decrease glucose levels by increasing his activity, decreasing the number of carbohydrates.  Patient also assigned EMMI videos to assist with broadening his knowledge base related to diabetes.    Need outpatient physical therapy as he was discharged form acute inpatient therapy over 2 weeks ago, did not qualify for   Patient gave this RNCM contact information on his VA representative-Ms. Mariella Saa [phone] to discuss having an in home aide.  Call made to representative, message left with this RNCM's contact.  Patient and this RNCM reviewed care plan and updated goals & interventions.   Ms. Landry Mellow returned this RNCM call.  Stated she is a Education officer, museum working with patient on housing.  Ms. Landry Mellow stated she is working to find [place] resources to assist patient with catching up on his rent payment.  Patient is receiving section 8, stated he got behind with his bills when he was hospitalized, and from his sister using the money to pay the bills to purchase recreation drugs.  This RNCM requested assisting in finding resources for in home aide assistance.  Ms. Landry Mellow stated she would consult with patient's RNCM for resources and return call.  Patient states he has been approved for Medicaid starting January 17, however, patient has been unable to produce letter.   This RNCM did look in [place] for information on Medicaid approval.  Patient does have recipient number, however, message states patient does not have coverage.    Plan: Home visit On October 14, 2015 for follow up

## 2015-09-28 ENCOUNTER — Other Ambulatory Visit: Payer: Self-pay | Admitting: Licensed Clinical Social Worker

## 2015-09-28 NOTE — Patient Outreach (Signed)
[place] [facility]) Care Management  09/28/2015  MEDGAR BECHEN April 08, 1966 [id number]   Assessment-CSW completed assessment on 09/28/15 and was able to reach patient successfully. Patient reports that he is "feeling well" but is still in need of physical therapy. Patient's RNCM is aware of this. Patient states to CSW that while he was hospitalized he provided rent money to his sister to provide to landlord and she was currently staying in his residence. However, patient reports that sister used money for illegal substances as she has a history of substance abuse. Patient reports that he is working with his landlord and has good rapport with him. Patient reports that landlord is understanding of situation. Patient reports that he is currently working with the [place] in order to gain needed financial resources to assist him during this time. CSW took time to review financial resources within the community that was mailed to his residence that he could benefit from. Patient reports appreciation for financial assistance resources. Patient shares that he believes he will start receiving Medicaid on 10/27/15 as he received a letter in the mailing informing him of this. RNCM was unable to locate medicaid approval on [place] tracks. Patient shares that he is in need of personal care services "for a few hours a week." CSW educated patient on personal care services and Medicaid eligibility for being the pay source for this service. Patient expresses interest in this service.  Plan-CSW will continue to be available to patient for all social work needs. CSW will work with [facility] on patient's goals. CSW unable to complete joint home visit on 10/14/15 but will receive updates from RNCM's home visit.  Eula Fried, BSW, MSW, [email or web address] Phone: [phone] Fax: [phone]

## 2015-09-30 ENCOUNTER — Encounter: Payer: Self-pay | Admitting: Internal Medicine

## 2015-09-30 ENCOUNTER — Ambulatory Visit (INDEPENDENT_AMBULATORY_CARE_PROVIDER_SITE_OTHER): Payer: Medicare Other | Admitting: Internal Medicine

## 2015-09-30 VITALS — BP 137/75 | HR 83 | Temp 97.6°F | Ht 68.0 in | Wt 294.6 lb

## 2015-09-30 DIAGNOSIS — Z7984 Long term (current) use of oral hypoglycemic drugs: Secondary | ICD-10-CM

## 2015-09-30 DIAGNOSIS — Z794 Long term (current) use of insulin: Secondary | ICD-10-CM | POA: Diagnosis not present

## 2015-09-30 DIAGNOSIS — Z992 Dependence on renal dialysis: Secondary | ICD-10-CM | POA: Diagnosis not present

## 2015-09-30 DIAGNOSIS — Z0000 Encounter for general adult medical examination without abnormal findings: Secondary | ICD-10-CM

## 2015-09-30 DIAGNOSIS — M4624 Osteomyelitis of vertebra, thoracic region: Secondary | ICD-10-CM

## 2015-09-30 DIAGNOSIS — I120 Hypertensive chronic kidney disease with stage 5 chronic kidney disease or end stage renal disease: Secondary | ICD-10-CM

## 2015-09-30 DIAGNOSIS — I10 Essential (primary) hypertension: Secondary | ICD-10-CM

## 2015-09-30 DIAGNOSIS — N186 End stage renal disease: Secondary | ICD-10-CM | POA: Diagnosis not present

## 2015-09-30 DIAGNOSIS — E1122 Type 2 diabetes mellitus with diabetic chronic kidney disease: Secondary | ICD-10-CM | POA: Diagnosis not present

## 2015-09-30 DIAGNOSIS — Z79899 Other long term (current) drug therapy: Secondary | ICD-10-CM | POA: Diagnosis not present

## 2015-09-30 MED ORDER — ATORVASTATIN CALCIUM 40 MG PO TABS: 40.0000 mg | ORAL_TABLET | Freq: Every day | ORAL | Status: DC

## 2015-09-30 NOTE — Patient Instructions (Addendum)
It was a pleasure to see you again [name].  I have sent a prescription for a cholesterol medication Atorvastatin 40 mg to your pharmacy.  We will try to figure out why you have not received physical therapy yet. Please also call Dr. Naaman Plummer office for your follow up.  I have sent a referral to our nutritionist Ms. Butch Penny Plyler as well.  Good job on your improved Hgb A1C!

## 2015-10-01 ENCOUNTER — Other Ambulatory Visit: Payer: Self-pay | Admitting: Licensed Clinical Social Worker

## 2015-10-01 DIAGNOSIS — Z0000 Encounter for general adult medical examination without abnormal findings: Secondary | ICD-10-CM | POA: Insufficient documentation

## 2015-10-01 NOTE — Assessment & Plan Note (Signed)
Patient was admitted to hospital from 9/23-10/4 for Staph lugdunensis bacteremia and treated with 2 weeks IV Cefazolin. He had progressive back pain for 1 month afterwards without relief from muscle relaxers or pain medication. He was sent for CT and MRI imaging of the spine which revealed discitis-osteomyelitis at T4-5 with endplate destructive changes and vertebral body height loss and retropulsion of inferior margin of T4 and superior margin of T5 into spinal canal with moderate stenosis and cord compression. Patient was taken to OR on 11/02 for T4-T5 decompressive laminectomy with T4 and T5 transpedicular corpectomy and arthrodesis by Dr. Annette Stable. Tissue and abscess cultures were obtained without growth. His osteomyelitis was presumed to be from Staph lugdunensis and he was started on 6 weeks course of IV Cefazolin administered during his HD sessions (MWF) with last dose on 09/15/15. He was in inpatient rehab for 3 weeks post-op and discharged on 11/30. He says he has been doing well since, with some continued soreness at his back, but much improved compared to when he was admitted. He is able to ambulate using a walker at home, denies any falls. He says he goes to the gym with his good friend, using the bike machine and light weights. He was to have physical therapy at home, but as he is not homebound has not received this. He has not had any outpatient rehab since leaving the hospital either. He is frustrated about this as he wants to continue to make progress towards his mobility. He denies any fevers, chills, N/V/D/C, loss of bowel/bladder, weakness in arms or legs.  A/P: Patient progressing well, completed 6 weeks of IV Cefazolin. He will follow up with ID Dr. Linus Salmons on 10/13/15. He will call to follow up with Neurosurgery Dr. Annette Stable and PM&R Dr. Naaman Plummer. I agree patient would benefit from physical therapy and as he is not homebound, he was not able to receive home PT. For some reason, he has not been set up for  outpatient PT. We will try to see if this is already in process, but I will send a referral for PT now just in case. -Referral outpatient PT -f/u with ID, Neurosurgery, PM&R

## 2015-10-01 NOTE — Addendum Note (Signed)
Addended by: Oval Linsey D on: 10/01/2015 09:38 AM   Modules accepted: Level of Service

## 2015-10-01 NOTE — Assessment & Plan Note (Signed)
BP Readings from Last 3 Encounters:  09/30/15 137/75  09/09/15 148/81  08/17/15 173/88  Currently on Amlodipine 10 mg, reports compliance.  A/P: Blood pressure stable and controlled at this time. -Continue Amlodipine 10 mg daily

## 2015-10-01 NOTE — Assessment & Plan Note (Signed)
Patient not currently on statin. Has previously been on Pravastatin which was discontinued for some reason. No recent lipid panel on file, but based on prior, he has a 10 year ASCVD risk of 19.7%. Will not check lipid panel at this time as guidelines do not recommend with significant risk (and patient has difficulty blood draw). Instead will go ahead and start on high-intensity statin.  A/P: ASCVD risk 19.7%, not currently on statin. Will start high-intensity today. -Start Atorvastatin 40 mg daily

## 2015-10-01 NOTE — Progress Notes (Signed)
I saw and evaluated the patient.  I personally confirmed the key portions of Dr. Patel's history and exam and reviewed pertinent patient test results.  The assessment, diagnosis, and plan were formulated together and I agree with the documentation in the resident's note. 

## 2015-10-01 NOTE — Progress Notes (Signed)
[name], male   DOB: [birth year], 49 y.o.   MRN: [id number]   Subjective:   [name] male   DOB: 21-Mar-1966 49 y.o.   MRN: [id number]  HPI: [name] is a 49 y.o. 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 Please see problem list for status of patient's chronic medical conditions.   Past Medical History  Diagnosis Date  . Hypertension   . ESRD (end stage renal disease) on dialysis ([place])   . 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 Sleep apnea     USES CPAP  . Diabetes mellitus     INSULIN DEPENDENT DIABETES  . Discitis 07/2015   Current Outpatient Prescriptions  Medication Sig Dispense Refill  . amLODipine (NORVASC) 10 MG tablet Take 1 tablet (10 mg total) by mouth daily. 30 tablet 0  . aspirin EC 81 MG tablet Take 81 mg by mouth daily.    . calcitRIOL (ROCALTROL) 0.5 MCG capsule Take 1 capsule (0.5 mcg total) by mouth every Monday, Wednesday, and Friday.    . calcium acetate (PHOSLO) 667 MG capsule Take 3 capsules (2,001 mg total) by mouth 3 (three) times daily with meals.    . cinacalcet (SENSIPAR) 30 MG tablet Take 1 tablet (30 mg total) by mouth daily with breakfast. 60 tablet   . Darbepoetin Alfa (ARANESP) 100 MCG/0.5ML SOSY injection Inject 1 mL (200 mcg total) into the vein every Monday with hemodialysis. 4.2 mL   . glimepiride (AMARYL) 2 MG tablet Take 1 tablet (2 mg total) by mouth daily with breakfast. 30 tablet 0  . hydrocerin (EUCERIN) CREA Apply 1 application topically 2 (two) times daily. 454 g 0  . insulin aspart (NOVOLOG) 100 UNIT/ML injection Inject 0-15 Units into the skin 3 (three) times daily with meals. CBG < 70: drink orange juice and recheck glucose or use glucose tabs CBG > 400: call MD   CBG 70  - 120: 0 units CBG 121 - 150: 3 units CBG 151 - 200: 4 units CBG 201 - 250: 7 units CBG 251 - 300: 11 units CBG 301 - 350: 15 units CBG 351 - 400: 20 units 1 vial 12  . multivitamin (RENA-VIT) TABS tablet Take 1 tablet by mouth at bedtime. 30 tablet 0  . Oxycodone HCl 10 MG TABS Take 0.5-1 tablets (5-10 mg total) by mouth 2 (two) times daily as needed. 60 tablet 0  . senna-docusate (SENOKOT-S) 8.6-50 MG tablet Take 2 tablets by mouth at bedtime. 100 tablet 0  . albuterol (PROVENTIL) (2.5 MG/3ML) 0.083% nebulizer solution Take 3 mLs (2.5 mg total) by nebulization every 6 (six) hours as needed for wheezing or shortness of breath. (Patient not taking: Reported on 09/30/2015) 75 mL 12  . atorvastatin (LIPITOR) 40 MG tablet Take 1 tablet (40 mg total) by mouth daily. 30 tablet 11  . cyclobenzaprine (FLEXERIL) 10 MG tablet Take 1 tablet (10 mg total) by mouth 3 (three) times daily as needed for muscle spasms. (Patient not taking: Reported on 09/30/2015) 42 tablet 0  . pantoprazole (PROTONIX) 40 MG tablet Take 1 tablet (40 mg total) by mouth at bedtime. (Patient not taking: Reported on 09/30/2015) 30 tablet 0   No current facility-administered medications for  this visit.   Family History  Problem Relation Age of Onset  . Diabetes Mother   . Diabetes Sister   . Diabetes Brother    Social History   Social History  . Marital Status: Single    Spouse Name: N/A  . Number of Children: N/A  . Years of Education: N/A   Social History Main Topics  . Smoking status: Never Smoker   . Smokeless tobacco: Never Used  . Alcohol Use: 0.6 oz/week    1 Cans of beer per week  . Drug Use: Yes    Special: Marijuana     Comment: ocassionaly   . Sexual Activity: Not Currently   Other Topics Concern  . None   Social History Narrative   [employer] man during the early 64s with deployments to the [place].  Was in operations control.  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  Divorce with 5 children, 3 boys and 2 girls.  Lives in [place].    Review of Systems: Review of Systems  Constitutional: Negative for fever and chills.  Respiratory: Negative for cough, sputum production and shortness of breath.   Cardiovascular: Negative for chest pain and palpitations.  Gastrointestinal: Negative for nausea, vomiting, abdominal pain, diarrhea and constipation.  Genitourinary: Negative for dysuria and hematuria.  Musculoskeletal: Positive for back pain. Negative for falls.  Skin: Negative for rash.  Neurological: Positive for tingling. Negative for dizziness and headaches.    Objective:  Physical Exam: Filed Vitals:   09/30/15 1437  BP: 137/75  Pulse: 83  Temp: 97.6 F (36.4 C)  TempSrc: Oral  Height: 5\' 8"  (1.727 m)  Weight: 294 lb 9.6 oz (133.63 kg)  SpO2: 97%   Physical Exam  Constitutional: He is oriented to person, place, and time. He appears well-developed and well-nourished. No distress.  Pleasant, sitting in wheelchair  HENT:  Head: Normocephalic and atraumatic.  Cardiovascular: Normal rate and regular rhythm.   Pulmonary/Chest:  Distant breath sounds, clear, no rales or wheezing heard  Abdominal: Soft. There is no tenderness.  Musculoskeletal:       Thoracic back: He exhibits tenderness.  Strength in lower extremities intact. Patient able to stand up for me, but cannot ambulate without walker due to back pain  Neurological: He is alert and oriented to person, place, and time.  Skin: Skin is warm.  Surgical scar on back is well-healed  Psychiatric: He has a normal mood and affect.    Assessment & Plan:  Please see problem based charting for assessment and plan.

## 2015-10-01 NOTE — Assessment & Plan Note (Signed)
Hgb A1C on 07/03/15 was 8.0. He had A1C rechecked at dialysis, which is improved to 6.2. He is currently on Novolog sliding scale TID with meals and Glimepiride 2 mg qd. He brought his meter, but was not able to be downloaded. He reports average numbers of 105-130 with low around 74. He is motivated to continue to improve his diet and exercise.  A/P: Improved and controlled well at this time. He has expressed interest in continuing improve his diet and lose weight. Will refer to Debera Lat, RD for further counseling. -Continue current meds -Refer to Debera Lat, RD

## 2015-10-02 ENCOUNTER — Other Ambulatory Visit: Payer: Self-pay | Admitting: Licensed Clinical Social Worker

## 2015-10-08 ENCOUNTER — Other Ambulatory Visit: Payer: Self-pay | Admitting: Internal Medicine

## 2015-10-08 ENCOUNTER — Ambulatory Visit: Payer: Medicare Other | Attending: Student | Admitting: Physical Therapy

## 2015-10-08 DIAGNOSIS — N186 End stage renal disease: Principal | ICD-10-CM

## 2015-10-08 DIAGNOSIS — M6281 Muscle weakness (generalized): Secondary | ICD-10-CM | POA: Insufficient documentation

## 2015-10-08 DIAGNOSIS — R262 Difficulty in walking, not elsewhere classified: Secondary | ICD-10-CM | POA: Diagnosis not present

## 2015-10-08 DIAGNOSIS — I10 Essential (primary) hypertension: Secondary | ICD-10-CM

## 2015-10-08 DIAGNOSIS — E1169 Type 2 diabetes mellitus with other specified complication: Secondary | ICD-10-CM | POA: Insufficient documentation

## 2015-10-08 DIAGNOSIS — M4324 Fusion of spine, thoracic region: Secondary | ICD-10-CM | POA: Insufficient documentation

## 2015-10-08 DIAGNOSIS — M869 Osteomyelitis, unspecified: Secondary | ICD-10-CM | POA: Diagnosis not present

## 2015-10-08 DIAGNOSIS — E1122 Type 2 diabetes mellitus with diabetic chronic kidney disease: Secondary | ICD-10-CM

## 2015-10-08 DIAGNOSIS — Z992 Dependence on renal dialysis: Principal | ICD-10-CM

## 2015-10-08 NOTE — Telephone Encounter (Signed)
Pt requesting oxycodone to be filled. °

## 2015-10-09 NOTE — Therapy (Signed)
[place], [place], [address] Phone: [phone]   Fax:  [phone]  Physical Therapy Evaluation  Patient Details  Name: Seth [name] MRN: [id number] Date of Birth: 1965/10/23 Referring Provider: Dr. Zada Finders  Encounter Date: 10/08/2015      PT End of Session - 10/09/15 1151    Visit Number 1   Number of Visits 24   Date for PT Re-Evaluation 12/03/15   Authorization Type Medicare; G code at visit 10;  [place] at visit 15   PT Start Time 1020   PT Stop Time 1100   PT Time Calculation (min) 40 min   Activity Tolerance Patient tolerated treatment well      Past Medical History  Diagnosis Date  . Hypertension   . ESRD (end stage renal disease) on dialysis ([place])   . 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 Sleep apnea     USES CPAP  . Diabetes mellitus     INSULIN DEPENDENT DIABETES  . Discitis 07/2015    Past Surgical History  Procedure Laterality Date  . Av fistula placement  05/03/2012    Procedure: ARTERIOVENOUS (AV) FISTULA CREATION;  Surgeon: Mal [name], MD;  Location: [facility];  Service: Vascular;  Laterality: Right;  . Tracheostomy tube placement N/A 08/20/2013    Procedure: TRACHEOSTOMY Revision;  Surgeon: Melida Quitter, MD;  Location: [facility];  Service: ENT;  Laterality: N/A;  . Tee without cardioversion N/A 07/10/2015    Procedure: TRANSESOPHAGEAL ECHOCARDIOGRAM (TEE);  Surgeon: Josue Hector, MD;  [facility] END[facility]COPY;  Service: Cardiovascular;  Laterality: N/A;    There were no vitals filed for this visit.  Visit Diagnosis:  Fusion of spine of thoracic region - Plan: PT plan of care cert/re-cert  Difficulty in walking - Plan: PT plan of care cert/re-cert  Muscle weakness (generalized) - Plan: PT plan of care cert/re-cert  Diabetic osteomyelitis ([place]) - Plan: PT plan of care cert/re-cert      Subjective Assessment - 10/08/15  1024    Subjective Oct had a flu shot and got very sick and went to the hospital several times,  very low hemoglobin.  In hospital for a month.   Had transfusions.  Upon discharge, had back pain, "infection got in my spine."  Had surgery 08/12/2015 for T1-T7 fusion by Dr. Annette Stable .  Went to inpatient rehab discharge 11/30.  Able to walk with RW in house, uses wheelchair.  Previously could walk without assistive device   Patient is accompained by: Family member  son   Pertinent History ESRD on dialysis; right knee   Limitations Walking;Standing   How long can you walk comfortably? 75 yards   Diagnostic tests CT scan   Patient Stated Goals walk without a walker   Currently in Pain? Yes   Pain Score 8    Pain Location Back   Pain Orientation Mid   Pain Type Surgical pain   Pain Onset More than a month ago   Pain Frequency Constant   Aggravating Factors  turning the wrong way, night time (usually sleeps in the recliner now)   Pain Relieving Factors moving around; pain medicine             [facility] PT Assessment - 10/08/15 1034    Assessment   Medical Diagnosis T1-T7 fusion   Referring Provider Dr. Zada Finders   Onset Date/Surgical Date 08/12/15  Hand Dominance Right   Next MD Visit 10/14/15   Prior Therapy Inpatient rehab   Precautions   Precautions Back   Precaution Comments no lift > 5#; no bend, no twist, no brace   Restrictions   Weight Bearing Restrictions No   Balance Screen   Has the patient fallen in the past 6 months No   Has the patient had a decrease in activity level because of a fear of falling?  No   Is the patient reluctant to leave their home because of a fear of falling?  No   Home Environment   Living Environment Private residence   Living Arrangements Children  51 yo   Home Access Stairs to enter   Entrance Stairs-Number of Steps Max Right   Home Layout One level   [place] - 2 wheels;Shower seat;Wheelchair - manual   Prior  Function   Level of Independence Independent with basic ADLs   Vocation On disability   Leisure watch TV, go to church, hang out with kids   Observation/Other Assessments   Focus on Therapeutic Outcomes (FOTO)  60% limitation   ROM / Strength   AROM / PROM / Strength AROM;Strength   AROM   AROM Assessment Site Hip;Knee;Ankle;Lumbar   Right/Left Hip Right;Left   Right Hip Flexion 90   Left Hip Flexion 90   Right/Left Knee Right;Left   Right Knee Extension 0   Right Knee Flexion 110   Left Knee Extension 0   Left Knee Flexion 110   Right/Left Ankle Right;Left   Right Ankle Dorsiflexion 0   Left Ankle Dorsiflexion 0   Lumbar Flexion lumbar not tested secondary to recent fusion   Strength   Strength Assessment Site Shoulder;Elbow;Forearm;Wrist;Hip;Knee;Ankle;Lumbar;Thoracic   Right/Left Shoulder Right;Left   Right Shoulder Flexion 3+/5   Right Shoulder Extension 3+/5   Right Shoulder ABduction 3+/5   Right Shoulder Internal Rotation 3+/5   Right Shoulder External Rotation 3+/5   Right Shoulder Horizontal ABduction 3+/5   Left Shoulder Flexion 3+/5   Left Shoulder Extension 3+/5   Left Shoulder ABduction 3+/5   Left Shoulder Internal Rotation 3+/5   Left Shoulder External Rotation 3+/5   Left Shoulder Horizontal ABduction 3+/5   Right/Left Elbow Right;Left   Right Elbow Flexion 4/5   Right Elbow Extension 4/5   Left Elbow Flexion 4/5   Left Elbow Extension 4/5   Right/Left Wrist Right;Left   Right Wrist Flexion 5/5   Right Wrist Extension 5/5   Left Wrist Flexion 5/5   Left Wrist Extension 5/5   Lumbar Flexion 3/5   Lumbar Extension 3/5   Thoracic Flexion 3/5   Thoracic Extension 3/5   Ambulation/Gait   Ambulation Distance (Feet) 100 Feet   Assistive device Rolling walker   Gait velocity .20m/sec  10 m walk test   Timed Up and Go Test   TUG Comments 25.9 with RW                             PT Short Term Goals - 10/09/15 1208    PT SHORT  TERM GOAL #1   Title The patient will be able to ambulate 300 feet with RW needed for short distance community ambulation for appointments to PT and dialysis   11/05/15   Time 4   Period Weeks   Status New   PT SHORT TERM GOAL #2   Title The patient  will report an overall improvement in pain and function by 25%   Time 4   Period Weeks   Status New   PT SHORT TERM GOAL #3   Title Gait speed with RW improved to .58m/sec for community ambulation    Time 4   Period Weeks   Status New           PT Long Term Goals - 10/09/15 1212    PT LONG TERM GOAL #1   Title The patient will be independent in safe self progression of HEP for further strengthening   12/03/15   Time 8   Period Weeks   Status New   PT LONG TERM GOAL #2   Title Shoulder, trunk and hip strength improved to 4-/5 grossly needed for standing longer periods of time for bathing, meal prep   Time 8   Period Weeks   Status New   PT LONG TERM GOAL #3   Title Timed up and Go test with appropriate assistive device improved to 13 sec indicating improved strength and gait speed for community mobility.     Time 8   Period Weeks   Status New   PT LONG TERM GOAL #4   Title Patient will be able to ambulate 600 feet with appropriate assistive device  needed for grocery shopping, going to church   Time 8   Period Weeks   Status New   PT LONG TERM GOAL #5   Title BERG balance test improved by 4 points indicating decreased risk of falls   Time 8   Period Weeks   Status New   Additional Long Term Goals   Additional Long Term Goals Yes   PT LONG TERM GOAL #6   Title FOTO functional outcome score improved to 42% indicating decreased pain and improved function   Time 8   Period Weeks   Status New               Plan - 10/09/15 1153    Clinical Impression Statement Patient arrives by manual wheelchair pushed by one of his teenage sons.  He reports that in Oct had a flu shot and got very sick and went to the hospital  several times and was in the hospital for a month.   He had very low hemoglobin and had several transfusions.  Upon discharge, he had back pain and states he  "infection got in my spine."  He was diagnosed with osteomyelitis of thoracic spine and had surgery 08/12/2015 for T1-T7 fusion by Dr. Annette Stable .  Went to inpatient rehab and reports he progressed very well from being non-ambulatory to walking with a rolling walker.  He was discharged home 11/30.  Able to walk with RW in house, uses wheelchair.  Previously could walk without assistive device.  He states he has noticed a decline in his function since leaving rehab and is highly motivated to return to PT for strengthening and states he is eager to come 3x/week, even on his dialysis days.  UE/LE ROM is grossly WFLs.  Spinal movements not tested secondaryto recent fusion.  Proximal weakness especially in bilateral shoulders 3/5 and hips.  Core weakness as well.  Gait with RW with decreased gait speed of .59 m/sec.  He would benefit from PT for core, UE, LE strengthening, gait training and balance assessment.  His goal is to ambulate without an assistive device and be able to take care of his sons.     Pt will benefit  from skilled therapeutic intervention in order to improve on the following deficits Decreased activity tolerance;Decreased mobility;Decreased range of motion;Decreased strength;Difficulty walking;Pain;Impaired UE functional use   Rehab Potential Good   Clinical Impairments Affecting Rehab Potential Dialysis M,W,F;  ESRD; morbid obesity   PT Frequency 3x / week   PT Duration 8 weeks   PT Treatment/Interventions ADLs/Self Care Home Management;Electrical Stimulation;Moist Heat;Therapeutic exercise;Therapeutic activities;Balance training;Gait training;Stair training;Neuromuscular re-education;Patient/family education;Manual techniques   PT Next Visit Plan Do BERG;  start Nu-Step;  supine scapular and core low level muscle recruitment           G-Codes - 10-23-2015 1221    Functional Assessment Tool Used FOTO; clinical judgement   Functional Limitation Mobility: Walking and moving around   Mobility: Walking and Moving Around Current Status [id number]) At least 60 percent but less than 80 percent impaired, limited or restricted   Mobility: Walking and Moving Around Goal Status [phone]) At least 40 percent but less than 60 percent impaired, limited or restricted       Problem List Patient Active Problem List   Diagnosis Date Noted  . Preventative health care 10/01/2015  . Osteoarthritis of right knee 09/01/2015  . Primary osteoarthritis of right knee   . 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 Osteomyelitis of thoracic spine ([place]) 08/16/2015  . Discitis of thoracic region   . Septic shock ([place]) 08/13/2015  . Acute respiratory failure with hypoxia ([place]) 08/13/2015  . Cord compression ([place]) 08/11/2015  . Back pain 08/11/2015  . Thoracic spine pain 08/11/2015  . Muscle strain of chest wall 07/21/2015  . Controlled type 2 diabetes mellitus with chronic kidney disease on chronic dialysis ([place]) 07/20/2015  . 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 Absolute anemia   . 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 OSA (obstructive sleep apnea) 10/14/2013  . Tracheostomy status ([place]) 09/17/2013  . Difficult airway for intubation 08/20/2013  . End stage renal disease on dialysis ([place]) 04/30/2012  . Hypocalcemia 04/30/2012  . Normocytic anemia 04/30/2012  . DM II (diabetes mellitus, type II), controlled ([place]) 06/20/2007  . DYSLIPIDEMIA 06/20/2007  . Morbid obesity ([place]) 06/20/2007  . Essential hypertension 06/20/2007    Alvera Singh 10/09/2015, 12:26 PM  [facility] [address] [place], [place], [address] Phone: [phone]   Fax:  [phone]  Name: Seth [name] MRN: [id number] Date of Birth: Jul 27, 1966   Ruben Im, PT 10/09/2015 12:26  PM Phone: [phone] Fax: [phone]

## 2015-10-13 ENCOUNTER — Ambulatory Visit: Payer: Medicare Other | Attending: Student

## 2015-10-13 ENCOUNTER — Encounter: Payer: Self-pay | Admitting: Internal Medicine

## 2015-10-13 ENCOUNTER — Ambulatory Visit (INDEPENDENT_AMBULATORY_CARE_PROVIDER_SITE_OTHER): Payer: Medicare Other | Admitting: Internal Medicine

## 2015-10-13 VITALS — BP 155/82 | HR 88 | Temp 98.2°F | Ht 68.0 in | Wt 310.4 lb

## 2015-10-13 DIAGNOSIS — R262 Difficulty in walking, not elsewhere classified: Secondary | ICD-10-CM | POA: Insufficient documentation

## 2015-10-13 DIAGNOSIS — R2681 Unsteadiness on feet: Secondary | ICD-10-CM | POA: Diagnosis not present

## 2015-10-13 DIAGNOSIS — M6281 Muscle weakness (generalized): Secondary | ICD-10-CM | POA: Insufficient documentation

## 2015-10-13 DIAGNOSIS — M4324 Fusion of spine, thoracic region: Secondary | ICD-10-CM | POA: Diagnosis not present

## 2015-10-13 DIAGNOSIS — M4624 Osteomyelitis of vertebra, thoracic region: Secondary | ICD-10-CM

## 2015-10-13 DIAGNOSIS — M545 Low back pain, unspecified: Secondary | ICD-10-CM

## 2015-10-13 NOTE — Telephone Encounter (Signed)
Pt requesting cholesterol med and oxycodone to be filled.

## 2015-10-13 NOTE — Progress Notes (Signed)
   Subjective:    [name], male    DOB: 05/11/1966, 50 y.o.   MRN: [id number]  HPI Here for hospital follow up.   Initially had Staph lugdunesis bacteremia and TEE negative and treated with 2 weeks IV cefazolin.  He then returned in November with back pain and T4-5 pathologic fracture with concern for infection with increased ESR.  He then completed 6 more weeks of cefazolin with dialysis and finished about 3-4 weeks ago.  No new issues.  No fever, no diarrhea, no new problems.  Feels well.  Getting rehab outpatient now.     Review of Systems  Constitutional: Negative for fever.  Gastrointestinal: Negative for nausea.  Skin: Negative for rash.  Neurological: Negative for dizziness.       Objective:   Physical Exam  Constitutional: He appears well-developed and well-nourished. No distress.  Obese, in wheelchair  Eyes: No scleral icterus.  Musculoskeletal:  No tenderness over spinous processes  Skin: No rash noted.          Assessment & Plan:

## 2015-10-13 NOTE — Assessment & Plan Note (Signed)
Is going to talk to his PCP for more pain medications

## 2015-10-13 NOTE — Assessment & Plan Note (Signed)
Resolved, now off of treatment one month without issues.  No follow up needed unless new concerns develop.

## 2015-10-13 NOTE — Therapy (Signed)
[place], [place], [address] Phone: [phone]   Fax:  [phone]  Physical Therapy Treatment  Patient Details  Name: Seth [name] MRN: [id number] Date of Birth: [birth year] Referring Provider: Dr. Zada Finders  Encounter Date: 10/13/2015      PT End of Session - 10/13/15 1536    Visit Number 2   Number of Visits 24   Date for PT Re-Evaluation 12/03/15   PT Start Time 0245   PT Stop Time 0335   PT Time Calculation (min) 50 min   Activity Tolerance Patient tolerated treatment well;Patient limited by pain   Behavior During Therapy [facility] for tasks assessed/performed      Past Medical History  Diagnosis Date  . Hypertension   . ESRD (end stage renal disease) on dialysis ([place])   . 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 Sleep apnea     USES CPAP  . Diabetes mellitus     INSULIN DEPENDENT DIABETES  . Discitis 07/2015    Past Surgical History  Procedure Laterality Date  . Av fistula placement  05/03/2012    Procedure: ARTERIOVENOUS (AV) FISTULA CREATION;  Surgeon: Mal [name], MD;  Location: [place];  Service: Vascular;  Laterality: Right;  . Tracheostomy tube placement N/A 08/20/2013    Procedure: TRACHEOSTOMY Revision;  Surgeon: Melida Quitter, MD;  Location: [place];  Service: ENT;  Laterality: N/A;  . Tee without cardioversion N/A 07/10/2015    Procedure: TRANSESOPHAGEAL ECHOCARDIOGRAM (TEE);  Surgeon: Josue Hector, MD;  Location: [facility] ENDOSCOPY;  Service: Cardiovascular;  Laterality: N/A;    There were no vitals filed for this visit.  Visit Diagnosis:  Difficulty in walking  Muscle weakness (generalized)  Fusion of spine of thoracic region  Unsteadiness on feet      Subjective Assessment - 10/13/15 1446    Subjective Pain 8/10 but lering to deal with pain at area of incision.   Doing HEP.    Patient is accompained by: Family member    Currently in Pain? Yes   Pain Score 8             [facility] PT Assessment - 10/13/15 1510    Standardized Balance Assessment   Standardized Balance Assessment Berg Balance Test   Berg Balance Test   Sit to Stand Able to stand without using hands and stabilize independently   Standing Unsupported Able to stand 2 minutes with supervision   Sitting with Back Unsupported but Feet Supported on Floor or Stool Able to sit safely and securely 2 minutes   Stand to Sit Controls descent by using hands   Transfers Able to transfer safely, definite need of hands   Standing Unsupported with Eyes Closed Able to stand 10 seconds with supervision   Standing Ubsupported with Feet Together Able to place feet together independently and stand for 1 minute with supervision   From Standing, Reach Forward with Outstretched Arm Can reach forward >12 cm safely (5")   From Standing Position, Pick up Object from Floor Able to pick up shoe, needs supervision   From Standing Position, Turn to Look Behind Over each Shoulder Turn sideways only but maintains balance   Turn 360 Degrees Needs close supervision or verbal cueing   Standing Unsupported, Alternately Place Feet on Step/Stool Needs assistance to keep from falling or unable to try   Standing Unsupported, One Foot in [employer]  balance while stepping or standing   Standing on One Leg Unable to try or needs assist to prevent fall   Total Score 32   Berg comment: Discussed score with patient for need to use wal;ker and how we measure progress with the test.                      [facility] Adult PT Treatment/Exercise - 10/13/15 0001    Knee/Hip Exercises: Aerobic   Nustep L7 5 min UE and LE   Knee/Hip Exercises: Standing   Hip Flexion Right;Left;10 reps  3 pounds each leg   Knee/Hip Exercises: Seated   Long Arc Quad Right;Left;15 reps   Long Arc Quad Weight 3 lbs.  5 sec   Sit to Sand 5 reps  stopped due to knee pain.    Shoulder Exercises: Seated    External Rotation Both;10 reps  2 sets   Theraband Level (Shoulder External Rotation) Level 1 (Yellow)   Internal Rotation 15 reps;Both  yellow soft ball 3 sec hold   Abduction Right;Left;10 reps   ABduction Weight (lbs) 1  and 10 reps with no weight to 90 degrees   ABduction Limitations Lt shoulder pain.    Other Seated Exercises over head press x10 no weight ,  x10 with 1 pound weight and x 10 with 2 pound weights, x10 with 2 pound weight RT and LT    Other Seated Exercises bicep curls x 20 with 2 pound weights.                 PT Education - 10/13/15 1541    Education provided Yes   Education Details BERG results and need for walker   Person(s) Educated Patient   Methods Explanation   Comprehension Verbalized understanding          PT Short Term Goals - 10/09/15 1208    PT SHORT TERM GOAL #1   Title The patient will be able to ambulate 300 feet with RW needed for short distance community ambulation for appointments to PT and dialysis   11/05/15   Time 4   Period Weeks   Status New   PT SHORT TERM GOAL #2   Title The patient will report an overall improvement in pain and function by 25%   Time 4   Period Weeks   Status New   PT SHORT TERM GOAL #3   Title Gait speed with RW improved to .70m/sec for community ambulation    Time 4   Period Weeks   Status New           PT Long Term Goals - 10/09/15 1212    PT LONG TERM GOAL #1   Title The patient will be independent in safe self progression of HEP for further strengthening   12/03/15   Time 8   Period Weeks   Status New   PT LONG TERM GOAL #2   Title Shoulder, trunk and hip strength improved to 4-/5 grossly needed for standing longer periods of time for bathing, meal prep   Time 8   Period Weeks   Status New   PT LONG TERM GOAL #3   Title Timed up and Go test with appropriate assistive device improved to 13 sec indicating improved strength and gait speed for community mobility.     Time 8   Period  Weeks   Status New   PT LONG TERM GOAL #4   Title Patient will be able  to ambulate 600 feet with appropriate assistive device  needed for grocery shopping, going to church   Time 8   Period Weeks   Status New   PT LONG TERM GOAL #5   Title BERG balance test improved by 4 points indicating decreased risk of falls   Time 8   Period Weeks   Status New   Additional Long Term Goals   Additional Long Term Goals Yes   PT LONG TERM GOAL #6   Title FOTO functional outcome score improved to 42% indicating decreased pain and improved function   Time 8   Period Weeks   Status New               Plan - 10/13/15 1537    Clinical Impression Statement Mr  Belschner walked into clinic from wait room with RW and tennis ball applied to back legs. Pain in LT shoulder and knee limited exercise with weights ,   BERG score indicated use of walker full time and he is doing this.     PT Next Visit Plan Continue strength and static balance , core strength low level.    PT Home Exercise Plan Asked him to bring in any HEP he has on paper   Consulted and Agree with Plan of Care Patient;Family member/caregiver        Problem List Patient Active Problem List   Diagnosis Date Noted  . Preventative health care 10/01/2015  . Osteoarthritis of right knee 09/01/2015  . Primary osteoarthritis of right knee   . 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 Osteomyelitis of thoracic spine ([place]) 08/16/2015  . Discitis of thoracic region   . Septic shock ([place]) 08/13/2015  . Acute respiratory failure with hypoxia ([place]) 08/13/2015  . Cord compression ([place]) 08/11/2015  . Back pain 08/11/2015  . Thoracic spine pain 08/11/2015  . Muscle strain of chest wall 07/21/2015  . Controlled type 2 diabetes mellitus with chronic kidney disease on chronic dialysis ([place]) 07/20/2015  . 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 Absolute anemia   . 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 OSA (obstructive sleep  apnea) 10/14/2013  . Tracheostomy status ([place]) 09/17/2013  . Difficult airway for intubation 08/20/2013  . End stage renal disease on dialysis ([place]) 04/30/2012  . Hypocalcemia 04/30/2012  . Normocytic anemia 04/30/2012  . DM II (diabetes mellitus, type II), controlled ([place]) 06/20/2007  . DYSLIPIDEMIA 06/20/2007  . Morbid obesity ([place]) 06/20/2007  . Essential hypertension 06/20/2007    Darrel Hoover PT 10/13/2015, 3:43 PM  [facility] [address] [place], [place], [address] Phone: [phone]   Fax:  [phone]  Name: Seth [name] MRN: [id number] Date of Birth: 07/20/1966

## 2015-10-14 ENCOUNTER — Ambulatory Visit: Payer: Medicare Other | Admitting: Physical Therapy

## 2015-10-14 DIAGNOSIS — M6281 Muscle weakness (generalized): Secondary | ICD-10-CM | POA: Diagnosis not present

## 2015-10-14 DIAGNOSIS — R262 Difficulty in walking, not elsewhere classified: Secondary | ICD-10-CM | POA: Diagnosis not present

## 2015-10-14 DIAGNOSIS — R2681 Unsteadiness on feet: Secondary | ICD-10-CM | POA: Diagnosis not present

## 2015-10-14 DIAGNOSIS — M4324 Fusion of spine, thoracic region: Secondary | ICD-10-CM

## 2015-10-14 NOTE — Therapy (Signed)
[place], [place], [address] Phone: [phone]   Fax:  [phone]  Physical Therapy Treatment  Patient Details  Name: Seth [name] MRN: [id number] Date of Birth: Sep 15, 1966 Referring Provider: Dr. Zada Finders  Encounter Date: 10/14/2015      PT End of Session - 10/14/15 1458    Visit Number 3   Number of Visits 24   Date for PT Re-Evaluation 12/03/15   Authorization Type Medicare; G code at visit 10;  [place] at visit 15   PT Start Time 1414   PT Stop Time 1455   PT Time Calculation (min) 41 min   Activity Tolerance Patient tolerated treatment well   Behavior During Therapy [facility] for tasks assessed/performed      Past Medical History  Diagnosis Date  . Hypertension   . ESRD (end stage renal disease) on dialysis ([place])   . 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 Sleep apnea     USES CPAP  . Diabetes mellitus     INSULIN DEPENDENT DIABETES  . Discitis 07/2015    Past Surgical History  Procedure Laterality Date  . Av fistula placement  05/03/2012    Procedure: ARTERIOVENOUS (AV) FISTULA CREATION;  Surgeon: Mal [name], MD;  Location: [place];  Service: Vascular;  Laterality: Right;  . Tracheostomy tube placement N/A 08/20/2013    Procedure: TRACHEOSTOMY Revision;  Surgeon: Melida Quitter, MD;  Location: [facility];  Service: ENT;  Laterality: N/A;  . Tee without cardioversion N/A 07/10/2015    Procedure: TRANSESOPHAGEAL ECHOCARDIOGRAM (TEE);  Surgeon: Josue Hector, MD;  [facility] ENDOSCOPY;  Service: Cardiovascular;  Laterality: N/A;    There were no vitals filed for this visit.  Visit Diagnosis:  Difficulty in walking  Muscle weakness (generalized)  Fusion of spine of thoracic region  Unsteadiness on feet      Subjective Assessment - 10/14/15 1418    Subjective Pain 8/10 in back; Doing HEP at home.   Patient Stated Goals walk without a  walker   Currently in Pain? Yes   Pain Score 8    Pain Location Back   Pain Orientation Mid   Pain Descriptors / Indicators Aching   Pain Type Surgical pain   Pain Onset More than a month ago   Pain Frequency Constant   Aggravating Factors  turning the wrong way, night time (sleeping in recliner)   Pain Relieving Factors moving around, medication            [facility] PT Assessment - 10/13/15 1510    Standardized Balance Assessment   Standardized Balance Assessment Berg Balance Test   Berg Balance Test   Sit to Stand Able to stand without using hands and stabilize independently   Standing Unsupported Able to stand 2 minutes with supervision   Sitting with Back Unsupported but Feet Supported on Floor or Stool Able to sit safely and securely 2 minutes   Stand to Sit Controls descent by using hands   Transfers Able to transfer safely, definite need of hands   Standing Unsupported with Eyes Closed Able to stand 10 seconds with supervision   Standing Ubsupported with Feet Together Able to place feet together independently and stand for 1 minute with supervision   From Standing, Reach Forward with Outstretched Arm Can reach forward >12 cm safely (5")   From Standing Position, Pick up Object from [place]  to pick up shoe, needs supervision   From Standing Position, Turn to Look Behind Over each Shoulder Turn sideways only but maintains balance   Turn 360 Degrees Needs close supervision or verbal cueing   Standing Unsupported, Alternately Place Feet on Step/Stool Needs assistance to keep from falling or unable to try   Standing Unsupported, One Foot in Front Loses balance while stepping or standing   Standing on One Leg Unable to try or needs assist to prevent fall   Total Score 32   Berg comment: Discussed score with patient for need to use wal;ker and how we measure progress with the test.                      [facility] Adult PT Treatment/Exercise - 10/14/15 1419    Knee/Hip  Exercises: Aerobic   Nustep L7 6 min UE and LE   Knee/Hip Exercises: Seated   Long Arc Quad Right;Left;Weights;10 reps;2 sets   [employer] Weight 3 lbs.   Ball Squeeze 5 sec x 20   Clamshell with TheraBand Green  2x10   Marching Limitations 3# 2x10 bil   Hamstring Curl Both;15 reps   Hamstring Limitations green theraband   Sit to Sand 10 reps;with UE support   Shoulder Exercises: Seated   External Rotation Both;15 reps;Weights   External Rotation Weight (lbs) 2   Internal Rotation 15 reps;Both;Weights   Internal Rotation Weight (lbs) 2   Abduction Both;10 reps   ABduction Weight (lbs) 2 on RUE: 0 on LUE   Other Seated Exercises overhead press 3# 2x10                PT Education - 10/13/15 1541    Education provided Yes   Education Details BERG results and need for walker   Person(s) Educated Patient   Methods Explanation   Comprehension Verbalized understanding          PT Short Term Goals - 10/09/15 1208    PT SHORT TERM GOAL #1   Title The patient will be able to ambulate 300 feet with RW needed for short distance community ambulation for appointments to PT and dialysis   11/05/15   Time 4   Period Weeks   Status New   PT SHORT TERM GOAL #2   Title The patient will report an overall improvement in pain and function by 25%   Time 4   Period Weeks   Status New   PT SHORT TERM GOAL #3   Title Gait speed with RW improved to .36m/sec for community ambulation    Time 4   Period Weeks   Status New           PT Long Term Goals - 10/09/15 1212    PT LONG TERM GOAL #1   Title The patient will be independent in safe self progression of HEP for further strengthening   12/03/15   Time 8   Period Weeks   Status New   PT LONG TERM GOAL #2   Title Shoulder, trunk and hip strength improved to 4-/5 grossly needed for standing longer periods of time for bathing, meal prep   Time 8   Period Weeks   Status New   PT LONG TERM GOAL #3   Title Timed up and Go  test with appropriate assistive device improved to 13 sec indicating improved strength and gait speed for community mobility.     Time 8   Period Weeks  Status New   PT LONG TERM GOAL #4   Title Patient will be able to ambulate 600 feet with appropriate assistive device  needed for grocery shopping, going to church   Time 8   Period Weeks   Status New   PT LONG TERM GOAL #5   Title BERG balance test improved by 4 points indicating decreased risk of falls   Time 8   Period Weeks   Status New   Additional Long Term Goals   Additional Long Term Goals Yes   PT LONG TERM GOAL #6   Title FOTO functional outcome score improved to 42% indicating decreased pain and improved function   Time 8   Period Weeks   Status New               Plan - 10/14/15 1458    Clinical Impression Statement Pt fatigued after session but tolerated exercises well without complaints.  Will continue to benefit from PT to maximize function   PT Next Visit Plan Continue strength and static balance , core strength low level. Standing exercises   Consulted and Agree with Plan of Care Patient        Problem List Patient Active Problem List   Diagnosis Date Noted  . Preventative health care 10/01/2015  . Osteoarthritis of right knee 09/01/2015  . Primary osteoarthritis of right knee   . 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 Osteomyelitis of thoracic spine ([place]) 08/16/2015  . Discitis of thoracic region   . Septic shock ([place]) 08/13/2015  . Acute respiratory failure with hypoxia ([place]) 08/13/2015  . Cord compression ([place]) 08/11/2015  . Back pain 08/11/2015  . Thoracic spine pain 08/11/2015  . Muscle strain of chest wall 07/21/2015  . Controlled type 2 diabetes mellitus with chronic kidney disease on chronic dialysis ([place]) 07/20/2015  . 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 Absolute anemia   . 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 OSA (obstructive sleep apnea) 10/14/2013  .  Tracheostomy status ([place]) 09/17/2013  . Difficult airway for intubation 08/20/2013  . End stage renal disease on dialysis ([place]) 04/30/2012  . Hypocalcemia 04/30/2012  . Normocytic anemia 04/30/2012  . DM II (diabetes mellitus, type II), controlled ([place]) 06/20/2007  . DYSLIPIDEMIA 06/20/2007  . Morbid obesity ([place]) 06/20/2007  . Essential hypertension 06/20/2007   Laureen Abrahams, PT, DPT 10/14/2015 3:00 PM  [facility] [address] [place], [place], [address] Phone: [phone]   Fax:  [phone]  Name: DESTRY DAWN MRN: [id number] Date of Birth: [birth year]

## 2015-10-15 ENCOUNTER — Inpatient Hospital Stay: Payer: Non-veteran care | Admitting: Internal Medicine

## 2015-10-15 ENCOUNTER — Ambulatory Visit: Payer: Medicare Other

## 2015-10-15 DIAGNOSIS — R262 Difficulty in walking, not elsewhere classified: Secondary | ICD-10-CM

## 2015-10-15 DIAGNOSIS — M4324 Fusion of spine, thoracic region: Secondary | ICD-10-CM | POA: Diagnosis not present

## 2015-10-15 DIAGNOSIS — M6281 Muscle weakness (generalized): Secondary | ICD-10-CM

## 2015-10-15 DIAGNOSIS — R2681 Unsteadiness on feet: Secondary | ICD-10-CM | POA: Diagnosis not present

## 2015-10-15 NOTE — Patient Instructions (Signed)
A from cabinet abdominal strength with reach to ankle lifting head and arm reach to ceiling RT and LT x 15 with head lift.

## 2015-10-15 NOTE — Therapy (Signed)
[place], [place], [address] Phone: [phone]   Fax:  [phone]  Physical Therapy Treatment  Patient Details  Name: Seth [name] MRN: [id number] Date of Birth: Jun 03, 1966 Referring Provider: Dr. Zada Finders  Encounter Date: 10/15/2015      PT End of Session - 10/15/15 1056    Visit Number 4   Number of Visits 24   Date for PT Re-Evaluation 12/03/15   PT Start Time [id number]   PT Stop Time 1058   PT Time Calculation (min) 43 min   Activity Tolerance Patient tolerated treatment well   Behavior During Therapy [facility] for tasks assessed/performed      Past Medical History  Diagnosis Date  . Hypertension   . ESRD (end stage renal disease) on dialysis ([place])   . 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 Sleep apnea     USES CPAP  . Diabetes mellitus     INSULIN DEPENDENT DIABETES  . Discitis 07/2015    Past Surgical History  Procedure Laterality Date  . Av fistula placement  05/03/2012    Procedure: ARTERIOVENOUS (AV) FISTULA CREATION;  Surgeon: Mal [name], MD;  Location: [place];  Service: Vascular;  Laterality: Right;  . Tracheostomy tube placement N/A 08/20/2013    Procedure: TRACHEOSTOMY Revision;  Surgeon: Melida Quitter, MD;  Location: [place];  Service: ENT;  Laterality: N/A;  . Tee without cardioversion N/A 07/10/2015    Procedure: TRANSESOPHAGEAL ECHOCARDIOGRAM (TEE);  Surgeon: Josue Hector, MD;  Location: [facility] ENDOSCOPY;  Service: Cardiovascular;  Laterality: N/A;    There were no vitals filed for this visit.  Visit Diagnosis:  Difficulty in walking  Muscle weakness (generalized)  Fusion of spine of thoracic region  Unsteadiness on feet      Subjective Assessment - 10/15/15 1028    Subjective RT knee sore . Having 3 PT days  in a row   Currently in Pain? Yes   Pain Score 8    Pain Location Back  and knee RT   Pain Orientation Right   knee across back   Pain Descriptors / Indicators Aching;Sore   Pain Type Surgical pain;Chronic pain   Pain Onset More than a month ago   Pain Frequency Constant                         [facility] Adult PT Treatment/Exercise - 10/15/15 1031    Knee/Hip Exercises: Aerobic   Nustep L4 UE and LE 7  min   Knee/Hip Exercises: Supine   Hip Adduction Isometric Both;15 reps  with flexion to 90 deg 2 pounds both arms   Bridges Limitations 25 reps   Straight Leg Raises Right;Left;2 sets;10 reps   Other Supine Knee/Hip Exercises Clams red band x 15 with flexion arms to 90 degrees, followed by hor adduction /abduction x 15 with 2 pounds.  followed by marching with red band arround knees x 1 each leg  , followed with red band row with band around feet x 15 and bicep curls x 15   Other Supine Knee/Hip Exercises Abdominal work with reach to ankles and lifting head with instruction on breathing in prior to reach and exhale with reaching x 12, followed by reaching to ceiling RT and LT arm x 15  with breathing cues.  Followed by 20 reps red band ER and IR press into  ball                 PT Education - 10/15/15 1056    Education provided Yes   Education Details Abdomnal strength   Person(s) Educated Patient   Methods Explanation;Tactile cues;Verbal cues;Handout   Comprehension Returned demonstration;Verbalized understanding          PT Short Term Goals - 10/15/15 1100    PT SHORT TERM GOAL #1   Title The patient will be able to ambulate 300 feet with RW needed for short distance community ambulation for appointments to PT and dialysis   11/05/15           PT Long Term Goals - 10/09/15 1212    PT LONG TERM GOAL #1   Title The patient will be independent in safe self progression of HEP for further strengthening   12/03/15   Time 8   Period Weeks   Status New   PT LONG TERM GOAL #2   Title Shoulder, trunk and hip strength improved to 4-/5 grossly needed for standing longer  periods of time for bathing, meal prep   Time 8   Period Weeks   Status New   PT LONG TERM GOAL #3   Title Timed up and Go test with appropriate assistive device improved to 13 sec indicating improved strength and gait speed for community mobility.     Time 8   Period Weeks   Status New   PT LONG TERM GOAL #4   Title Patient will be able to ambulate 600 feet with appropriate assistive device  needed for grocery shopping, going to church   Time 8   Period Weeks   Status New   PT LONG TERM GOAL #5   Title BERG balance test improved by 4 points indicating decreased risk of falls   Time 8   Period Weeks   Status New   Additional Long Term Goals   Additional Long Term Goals Yes   PT LONG TERM GOAL #6   Title FOTO functional outcome score improved to 42% indicating decreased pain and improved function   Time 8   Period Weeks   Status New               Plan - 10/15/15 1057    Clinical Impression Statement He is doing well with the exercise but is limited with pain  in knee today.  He was able to do the abdominal exercises.    PT Next Visit Plan Continue strength and static balance , core strength low level. Standing and mat exercises, review goals   Consulted and Agree with Plan of Care Patient        Problem List Patient Active Problem List   Diagnosis Date Noted  . Preventative health care 10/01/2015  . Osteoarthritis of right knee 09/01/2015  . Primary osteoarthritis of right knee   . 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 Osteomyelitis of thoracic spine ([place]) 08/16/2015  . Discitis of thoracic region   . Septic shock ([place]) 08/13/2015  . Acute respiratory failure with hypoxia ([place]) 08/13/2015  . Cord compression ([place]) 08/11/2015  . Back pain 08/11/2015  . Thoracic spine pain 08/11/2015  . Muscle strain of chest wall 07/21/2015  . Controlled type 2 diabetes mellitus with chronic kidney disease on chronic dialysis ([place]) 07/20/2015  . Bacteremia due to  Staphylococcus lugdunensis 07/11/2015  . Absolute anemia   . 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 OSA (obstructive sleep apnea)  10/14/2013  . Tracheostomy status ([place]) 09/17/2013  . Difficult airway for intubation 08/20/2013  . End stage renal disease on dialysis ([place]) 04/30/2012  . Hypocalcemia 04/30/2012  . Normocytic anemia 04/30/2012  . DM II (diabetes mellitus, type II), controlled ([place]) 06/20/2007  . DYSLIPIDEMIA 06/20/2007  . Morbid obesity ([place]) 06/20/2007  . Essential hypertension 06/20/2007    Darrel Hoover PT 10/15/2015, 11:02 AM  [facility] [address] [place], [place], [address] Phone: [phone]   Fax:  [phone]  Name: Seth [name] MRN: [id number] Date of Birth: 1966/02/21

## 2015-10-15 NOTE — Telephone Encounter (Signed)
Pt called about 2 meds- lipitor and oxycodone lipitor was filled 12/21, it was on shelf for pick up at [facility] for 9 days and reshelved due to not being picked up Oxycodone is filled by dr Naaman Plummer, pt offered appt in [facility] for eval for pain med, refused appt, he is advised to call dr Naaman Plummer office for refill

## 2015-10-16 ENCOUNTER — Other Ambulatory Visit: Payer: Self-pay | Admitting: *Deleted

## 2015-10-16 ENCOUNTER — Telehealth: Payer: Self-pay

## 2015-10-16 MED ORDER — OXYCODONE HCL 10 MG PO TABS: 5.0000 mg | ORAL_TABLET | Freq: Two times a day (BID) | ORAL | Status: DC | PRN

## 2015-10-16 MED FILL — ATORVASTATIN 40 MG TABLET: 40 | 30 days supply | Qty: 30 | Fill #0

## 2015-10-16 MED FILL — oxyCODONE HCL 10 MG TABS: 10 | 30 days supply | Qty: 60 | Fill #0

## 2015-10-16 NOTE — Telephone Encounter (Signed)
Pt called requesting a refill for his Oxycodone. He was D/C on 08/17/15. Reesa Chew wrote the original script. He has a hospital f/u on 11/03/15 with ZS. Please advise on refill?

## 2015-10-16 NOTE — Telephone Encounter (Signed)
Medication refilled by Lysbeth Penner NP and he will keep his appt with Dr Naaman Plummer 11/03/15 to receive any further refills.  Needs to sign a CSA/ UDS  See telephone message

## 2015-10-16 NOTE — Telephone Encounter (Signed)
I spoke with [name]. Seth [name] reviewed last prescription of Oxycodone was picked up on 09/10/2015. Prescription will be printed, he's scheduled to see Dr. Naaman Plummer on 11/03/2015 for hospital follow up, He verbalizes understanding. He was instructed to have ID and verbalizes understanding.

## 2015-10-19 ENCOUNTER — Ambulatory Visit: Payer: Medicare Other

## 2015-10-19 DIAGNOSIS — Z794 Long term (current) use of insulin: Secondary | ICD-10-CM | POA: Diagnosis not present

## 2015-10-19 DIAGNOSIS — E119 Type 2 diabetes mellitus without complications: Secondary | ICD-10-CM | POA: Diagnosis not present

## 2015-10-20 ENCOUNTER — Ambulatory Visit: Payer: Medicare Other

## 2015-10-20 DIAGNOSIS — M6281 Muscle weakness (generalized): Secondary | ICD-10-CM | POA: Diagnosis not present

## 2015-10-20 DIAGNOSIS — M4324 Fusion of spine, thoracic region: Secondary | ICD-10-CM

## 2015-10-20 DIAGNOSIS — R2681 Unsteadiness on feet: Secondary | ICD-10-CM | POA: Diagnosis not present

## 2015-10-20 DIAGNOSIS — R262 Difficulty in walking, not elsewhere classified: Secondary | ICD-10-CM | POA: Diagnosis not present

## 2015-10-20 NOTE — Therapy (Signed)
[place], [place], [address] Phone: [phone]   Fax:  [phone]  Physical Therapy Treatment  Patient Details  Name: Seth [name] MRN: [id number] Date of Birth: 08/05/1966 Referring Provider: Dr. Zada Finders  Encounter Date: 10/20/2015      PT End of Session - 10/20/15 1058    Visit Number 5   Number of Visits 24   Date for PT Re-Evaluation 12/03/15   PT Start Time 1015   PT Stop Time 1057   PT Time Calculation (min) 42 min   Activity Tolerance Patient tolerated treatment well   Behavior During Therapy [facility] for tasks assessed/performed      Past Medical History  Diagnosis Date  . Hypertension   . ESRD (end stage renal disease) on dialysis ([place])   . 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 Sleep apnea     USES CPAP  . Diabetes mellitus     INSULIN DEPENDENT DIABETES  . Discitis 07/2015    Past Surgical History  Procedure Laterality Date  . Av fistula placement  05/03/2012    Procedure: ARTERIOVENOUS (AV) FISTULA CREATION;  Surgeon: Mal [name], MD;  Location: [place];  Service: Vascular;  Laterality: Right;  . Tracheostomy tube placement N/A 08/20/2013    Procedure: TRACHEOSTOMY Revision;  Surgeon: Melida Quitter, MD;  Location: [place];  Service: ENT;  Laterality: N/A;  . Tee without cardioversion N/A 07/10/2015    Procedure: TRANSESOPHAGEAL ECHOCARDIOGRAM (TEE);  Surgeon: Josue Hector, MD;  Location: [facility] ENDOSCOPY;  Service: Cardiovascular;  Laterality: N/A;    There were no vitals filed for this visit.  Visit Diagnosis:  Difficulty in walking  Muscle weakness (generalized)  Fusion of spine of thoracic region  Unsteadiness on feet                       [facility] Adult PT Treatment/Exercise - 10/20/15 1031    Lumbar Exercises: Supine   Ab Set 10 reps  5 sec   Large Ball Abdominal Isometric 10 reps;2 seconds   Large  Ball Abdominal Isometric Limitations Supine reaching with head and shoulder lift   Other Supine Lumbar Exercises Active  body rotation with arm and head leading to RT and LT x8   Knee/Hip Exercises: Aerobic   Nustep L6 UE and LE 7  min   Knee/Hip Exercises: Supine   Bridges with Ball Squeeze Both;20 reps   Bridges with Clamshell Both;20 reps  blue band   Straight Leg Raises Right;Left;2 sets  12 reps   Other Supine Knee/Hip Exercises Clams blue band x 15 with flexion arms to 90 degrees with 3 pound weight, followed by punches to ceiling with 3 pounds x12  RT and LT  followed by hor adduction /abduction x 15 with 3 pounds.  followed by marching with red band around knees x 1 each leg  , followed with red band row with band around feet x 15 and bicep curls x 15                  PT Short Term Goals - 10/15/15 1100    PT SHORT TERM GOAL #1   Title The patient will be able to ambulate 300 feet with RW needed for short distance community ambulation for appointments to PT and dialysis   11/05/15  PT Long Term Goals - 10/09/15 1212    PT LONG TERM GOAL #1   Title The patient will be independent in safe self progression of HEP for further strengthening   12/03/15   Time 8   Period Weeks   Status New   PT LONG TERM GOAL #2   Title Shoulder, trunk and hip strength improved to 4-/5 grossly needed for standing longer periods of time for bathing, meal prep   Time 8   Period Weeks   Status New   PT LONG TERM GOAL #3   Title Timed up and Go test with appropriate assistive device improved to 13 sec indicating improved strength and gait speed for community mobility.     Time 8   Period Weeks   Status New   PT LONG TERM GOAL #4   Title Patient will be able to ambulate 600 feet with appropriate assistive device  needed for grocery shopping, going to church   Time 8   Period Weeks   Status New   PT LONG TERM GOAL #5   Title BERG balance test improved by 4 points indicating  decreased risk of falls   Time 8   Period Weeks   Status New   Additional Long Term Goals   Additional Long Term Goals Yes   PT LONG TERM GOAL #6   Title FOTO functional outcome score improved to 42% indicating decreased pain and improved function   Time 8   Period Weeks   Status New               Plan - 10/20/15 1058    Clinical Impression Statement We didn't do sit to stand due to knees sore but he was able to do all others and reported the rolling and basic abdominal exercises worked him well.      PT Next Visit Plan Continue strength and static balance , core strength low level. Standing and mat exercises, review goals   Consulted and Agree with Plan of Care Patient        Problem List Patient Active Problem List   Diagnosis Date Noted  . Preventative health care 10/01/2015  . Osteoarthritis of right knee 09/01/2015  . Primary osteoarthritis of right knee   . Cord compression myelopathy ([place]) 08/17/2015  . Osteomyelitis of thoracic spine ([place]) 08/16/2015  . Discitis of thoracic region   . Septic shock ([place]) 08/13/2015  . Acute respiratory failure with hypoxia ([place]) 08/13/2015  . Cord compression ([place]) 08/11/2015  . Back pain 08/11/2015  . Thoracic spine pain 08/11/2015  . Muscle strain of chest wall 07/21/2015  . Controlled type 2 diabetes mellitus with chronic kidney disease on chronic dialysis ([place]) 07/20/2015  . 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 Absolute anemia   . 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 OSA (obstructive sleep apnea) 10/14/2013  . Tracheostomy status ([place]) 09/17/2013  . Difficult airway for intubation 08/20/2013  . End stage renal disease on dialysis ([place]) 04/30/2012  . Hypocalcemia 04/30/2012  . Normocytic anemia 04/30/2012  . DM II (diabetes mellitus, type II), controlled ([place]) 06/20/2007  . DYSLIPIDEMIA 06/20/2007  . Morbid obesity ([place]) 06/20/2007  . Essential hypertension 06/20/2007    Darrel Hoover  PT 10/20/2015, 11:01 AM  [facility] [address] [place], [place], [address] Phone: [phone]   Fax:  [phone]  Name: Seth [name] MRN: [id number] Date of Birth: 1966/01/14

## 2015-10-22 ENCOUNTER — Ambulatory Visit: Payer: Medicare Other

## 2015-10-22 DIAGNOSIS — M4324 Fusion of spine, thoracic region: Secondary | ICD-10-CM

## 2015-10-22 DIAGNOSIS — M6281 Muscle weakness (generalized): Secondary | ICD-10-CM

## 2015-10-22 DIAGNOSIS — R2681 Unsteadiness on feet: Secondary | ICD-10-CM

## 2015-10-22 DIAGNOSIS — R262 Difficulty in walking, not elsewhere classified: Secondary | ICD-10-CM | POA: Diagnosis not present

## 2015-10-22 NOTE — Therapy (Signed)
[facility] [address] [place], [place], [address] Phone: [phone]   Fax:  [phone]  Physical Therapy Treatment  Patient Details  Name: Seth [name] MRN: [id number] Date of Birth: 08/09/1966 Referring Provider: Dr. Vishal Patel  Encounter Date: 10/22/2015      PT End of Session - 10/22/15 1124    Visit Number 6   Number of Visits 24   Date for PT Re-Evaluation 12/03/15   PT Start Time 1018   PT Stop Time 1115   PT Time Calculation (min) 57 min   Activity Tolerance Patient tolerated treatment well   Behavior During Therapy WFL for tasks assessed/performed      Past Medical History  Diagnosis Date  . Hypertension   . ESRD (end stage renal disease) on dialysis ([name])   . RETINAL DETACHMENT, HX OF 06/20/2007    Qualifier: Diagnosis of  By: Mims RN, [name]    . Sleep apnea     USES CPAP  . Diabetes mellitus     INSULIN DEPENDENT DIABETES  . Discitis 07/2015    Past Surgical History  Procedure Laterality Date  . Av fistula placement  05/03/2012    Procedure: ARTERIOVENOUS (AV) FISTULA CREATION;  Surgeon: James D Lawson, MD;  Location: [facility] OR;  Service: Vascular;  Laterality: Right;  . Tracheostomy tube placement N/A 08/20/2013    Procedure: TRACHEOSTOMY Revision;  Surgeon: Dwight Bates, MD;  Location: [facility] OR;  Service: ENT;  Laterality: N/A;  . Tee without cardioversion N/A 07/10/2015    Procedure: TRANSESOPHAGEAL ECHOCARDIOGRAM (TEE);  Surgeon: Peter C Nishan, MD;  Location: [facility] ENDOSCOPY;  Service: Cardiovascular;  Laterality: N/A;    There were no vitals filed for this visit.  Visit Diagnosis:  Muscle weakness (generalized)  Difficulty in walking  Unsteadiness on feet  Fusion of spine of thoracic region          [facility] PT Assessment - 10/22/15 1018    Strength   Right Shoulder Flexion 4/5   Right Shoulder Extension 4+/5   Right Shoulder ABduction 4+/5   Right Shoulder Internal Rotation 4+/5   Right Shoulder External Rotation 4+/5   Right Shoulder Horizontal ABduction 4+/5   Left Shoulder Flexion 4/5   Left Shoulder Extension 4+/5   Left Shoulder ABduction 4+/5   Left Shoulder Internal Rotation 4+/5   Left Shoulder External Rotation 4+/5   Left Shoulder Horizontal ABduction 4/5   Right Elbow Flexion 4+/5   Right Elbow Extension 4+/5   Left Elbow Flexion 4+/5   Left Elbow Extension 4+/5                     [facility] Adult PT Treatment/Exercise - 10/22/15 1018    Ambulation/Gait   Ambulation Distance (Feet) 600 Feet   Assistive device Rolling walker   Gait Pattern Step-through pattern   Ambulation Surface Level;Indoor   Gait velocity ..83m/sec   Pre-Gait Activities He worked on wlking without support in parallel bars with light touch to railing  foreward and back 6x.    Gait Comments Also worked on walking with increased speed with RW with turns RT and LT   Neuro Re-ed    Neuro Re-ed Details  He worked on balancing with diagonals RT and LT in step stand position LT and RT foot in front and body rotation LT and RT 8-10 reps RT and LT.    Knee/Hip Exercises: Aerobic   Nustep L6 LE 6  min                    PT Short Term Goals - 10/22/15 1027    PT SHORT TERM GOAL #1   Title The patient will be able to ambulate 300 feet with RW needed for short distance community ambulation for appointments to PT and dialysis   11/05/15   Status Achieved   PT SHORT TERM GOAL #3   Title Gait speed with RW improved to .8m/sec for community ambulation    Baseline .83 m/sec today   Status Partially Met           PT Long Term Goals - 10/22/15 1042    PT LONG TERM GOAL #1   Title The patient will be independent in safe self progression of HEP for further strengthening   12/03/15   Status On-going   PT LONG TERM GOAL #2   Title Shoulder, trunk and hip strength improved to 4-/5 grossly needed for standing longer periods of time for bathing, meal prep   Baseline Shoulder  strength 4+/5 except flexion 4/5   Status Partially Met   PT LONG TERM GOAL #3   Title Timed up and Go test with appropriate assistive device improved to 13 sec indicating improved strength and gait speed for community mobility.     Baseline 17 sec today   Status On-going   PT LONG TERM GOAL #4   Title Patient will be able to ambulate 600 feet with appropriate assistive device  needed for grocery shopping, going to church   Baseline with rolling walker   Status Achieved   PT LONG TERM GOAL #5   Title BERG balance test improved by 4 points indicating decreased risk of falls   Status Unable to assess   PT LONG TERM GOAL #6   Title FOTO functional outcome score improved to 42% indicating decreased pain and improved function   Status Unable to assess               Plan - 10/22/15 1124    Clinical Impression Statement He has met goals for walking with RW and improved gait speed. He is more steady on feet but not ready for walking without walker. Need BERG in next 2-3 sessions.   PT Next Visit Plan Continue strength and static balance , core strength low level. Standing and mat exercises,    Consulted and Agree with Plan of Care Patient        Problem List Patient Active Problem List   Diagnosis Date Noted  . Preventative health care 10/01/2015  . Osteoarthritis of right knee 09/01/2015  . Primary osteoarthritis of right knee   . Cord compression myelopathy ([name]) 08/17/2015  . Osteomyelitis of thoracic spine ([name]) 08/16/2015  . Discitis of thoracic region   . Septic shock ([name]) 08/13/2015  . Acute respiratory failure with hypoxia ([name]) 08/13/2015  . Cord compression ([name]) 08/11/2015  . Back pain 08/11/2015  . Thoracic spine pain 08/11/2015  . Muscle strain of chest wall 07/21/2015  . Controlled type 2 diabetes mellitus with chronic kidney disease on chronic dialysis ([name]) 07/20/2015  . 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 Absolute anemia   . Cold  agglutinin disease ([name])   . OSA (obstructive sleep apnea) 10/14/2013  . Tracheostomy status ([name]) 09/17/2013  . Difficult airway for intubation 08/20/2013  . End stage renal disease on dialysis ([name]) 04/30/2012  . Hypocalcemia 04/30/2012  . Normocytic anemia 04/30/2012  . DM II (diabetes mellitus, type II), controlled ([name]) 06/20/2007  . DYSLIPIDEMIA 06/20/2007  . Morbid obesity ([name]) 06/20/2007  .   Essential hypertension 06/20/2007    Darrel Hoover PT 10/22/2015, 11:28 AM  [facility] [address] [place], [place], [address] Phone: [phone]   Fax:  [phone]  Name: Seth [name] MRN: [id number] Date of Birth: Aug 07, 1966

## 2015-10-26 ENCOUNTER — Ambulatory Visit: Payer: Medicare Other

## 2015-10-26 ENCOUNTER — Other Ambulatory Visit: Payer: Self-pay | Admitting: Licensed Clinical Social Worker

## 2015-10-26 DIAGNOSIS — M4324 Fusion of spine, thoracic region: Secondary | ICD-10-CM | POA: Diagnosis not present

## 2015-10-26 DIAGNOSIS — R262 Difficulty in walking, not elsewhere classified: Secondary | ICD-10-CM

## 2015-10-26 DIAGNOSIS — R2681 Unsteadiness on feet: Secondary | ICD-10-CM | POA: Diagnosis not present

## 2015-10-26 DIAGNOSIS — M6281 Muscle weakness (generalized): Secondary | ICD-10-CM | POA: Diagnosis not present

## 2015-10-26 NOTE — Therapy (Signed)
[place], [place], [address] Phone: [phone]   Fax:  [phone]  Physical Therapy Treatment  Patient Details  Name: Seth [name] MRN: [id number] Date of Birth: 24-Apr-1966 Referring Provider: Dr. Zada Finders  Encounter Date: 10/26/2015      PT End of Session - 10/26/15 1357    Visit Number 7   Number of Visits 24   Date for PT Re-Evaluation 12/03/15   PT Start Time 0134   PT Stop Time 0227   PT Time Calculation (min) 53 min   Activity Tolerance Patient tolerated treatment well   Behavior During Therapy [facility] for tasks assessed/performed      Past Medical History  Diagnosis Date  . Hypertension   . ESRD (end stage renal disease) on dialysis ([place])   . 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 Sleep apnea     USES CPAP  . Diabetes mellitus     INSULIN DEPENDENT DIABETES  . Discitis 07/2015    Past Surgical History  Procedure Laterality Date  . Av fistula placement  05/03/2012    Procedure: ARTERIOVENOUS (AV) FISTULA CREATION;  Surgeon: Mal [name], MD;  Location: [place];  Service: Vascular;  Laterality: Right;  . Tracheostomy tube placement N/A 08/20/2013    Procedure: TRACHEOSTOMY Revision;  Surgeon: Melida Quitter, MD;  Location: [facility];  Service: ENT;  Laterality: N/A;  . Tee without cardioversion N/A 07/10/2015    Procedure: TRANSESOPHAGEAL ECHOCARDIOGRAM (TEE);  Surgeon: Josue Hector, MD;  Location: [facility] ENDOSCOPY;  Service: Cardiovascular;  Laterality: N/A;    There were no vitals filed for this visit.  Visit Diagnosis:  Muscle weakness (generalized)  Difficulty in walking  Unsteadiness on feet  Fusion of spine of thoracic region      Subjective Assessment - 10/26/15 1338    Subjective RT knee killing me. was walking on it at home. IT does that with pain for a few days then eases off.    Currently in Pain? Yes   Pain Score 8     Pain Location Back   Pain Orientation Right   Pain Descriptors / Indicators Pressure;Aching;Sore   Pain Type Surgical pain;Chronic pain   Pain Onset More than a month ago   Pain Frequency Constant   Multiple Pain Sites Yes   Pain Score 7   Pain Location Knee   Pain Orientation Right   Pain Descriptors / Indicators Aching   Pain Type Chronic pain   Pain Frequency Constant                         [facility] Adult PT Treatment/Exercise - 10/26/15 1341    Ambulation/Gait   Ambulation Distance (Feet) 75 Feet  3 x,    HR 100-120 bpm post   Assistive device Straight cane   Gait Pattern Decreased step length - right;Decreased step length - left;Wide base of support   Ambulation Surface Level;Indoor   Gait Comments Guard contact +1    Neuro Re-ed    Neuro Re-ed Details  He worked on weight shifts with rotation using UE ranger for support LT and RT  with one foot foreward . He had difficulty shifting weight forward and back . He worked on stability with good postur and gluteal setting with arm movements and LT and RT rotation with feet patrallell. He also die march and side  kicks standing and  decreasing hyper extension of kj=knees  x 12 reps 5 sec                   PT Short Term Goals - 10/26/15 1439    PT SHORT TERM GOAL #1   Title The patient will be able to ambulate 300 feet with RW needed for short distance community ambulation for appointments to PT and dialysis   11/05/15   Status Achieved   PT SHORT TERM GOAL #2   Title The patient will report an overall improvement in pain and function by 25%   Status On-going   PT SHORT TERM GOAL #3   Title Gait speed with RW improved to .31msec for community ambulation    Status On-going           PT Long Term Goals - 10/22/15 1042    PT LONG TERM GOAL #1   Title The patient will be independent in safe self progression of HEP for further strengthening   12/03/15   Status On-going   PT LONG TERM GOAL #2   Title  Shoulder, trunk and hip strength improved to 4-/5 grossly needed for standing longer periods of time for bathing, meal prep   Baseline Shoulder strength 4+/5 except flexion 4/5   Status Partially Met   PT LONG TERM GOAL #3   Title Timed up and Go test with appropriate assistive device improved to 13 sec indicating improved strength and gait speed for community mobility.     Baseline 17 sec today   Status On-going   PT LONG TERM GOAL #4   Title Patient will be able to ambulate 600 feet with appropriate assistive device  needed for grocery shopping, going to church   Baseline with rolling walker   Status Achieved   PT LONG TERM GOAL #5   Title BERG balance test improved by 4 points indicating decreased risk of falls   Status Unable to assess   PT LONG TERM GOAL #6   Title FOTO functional outcome score improved to 42% indicating decreased pain and improved function   Status Unable to assess               Plan - 10/26/15 1358    Clinical Impression Statement He is not ready to walk alone with single hand device due to fall risk and poor endurance. He continues with poor single leg balance and weight shift form one leg to other.  H ei progressing but not safe off walker yet    PT Next Visit Plan Continue strength and static balance , core strength low level. Standing and mat exercises, gait as tolerated with SPC   Consulted and Agree with Plan of Care Patient        Problem List Patient Active Problem List   Diagnosis Date Noted  . Preventative health care 10/01/2015  . Osteoarthritis of right knee 09/01/2015  . Primary osteoarthritis of right knee   . Cord compression myelopathy ([place] 08/17/2015  . Osteomyelitis of thoracic spine ([place] 08/16/2015  . Discitis of thoracic region   . Septic shock ([place] 08/13/2015  . Acute respiratory failure with hypoxia ([place] 08/13/2015  . Cord compression ([place] 08/11/2015  . Back pain 08/11/2015  . Thoracic spine pain 08/11/2015  . Muscle  strain of chest wall 07/21/2015  . Controlled type 2 diabetes mellitus with chronic kidney disease on chronic dialysis ([place] 07/20/2015  . 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 Absolute  anemia   . 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 OSA (obstructive sleep apnea) 10/14/2013  . Tracheostomy status ([place]) 09/17/2013  . Difficult airway for intubation 08/20/2013  . End stage renal disease on dialysis ([place]) 04/30/2012  . Hypocalcemia 04/30/2012  . Normocytic anemia 04/30/2012  . DM II (diabetes mellitus, type II), controlled ([place]) 06/20/2007  . DYSLIPIDEMIA 06/20/2007  . Morbid obesity ([place]) 06/20/2007  . Essential hypertension 06/20/2007    Darrel Hoover PT 10/26/2015, 2:42 PM  [facility] [address] [place], [place], [address] Phone: [phone]   Fax:  [phone]  Name: Seth [name] MRN: [id number] Date of Birth: 10/17/1965

## 2015-10-26 NOTE — Patient Outreach (Signed)
[facility]) Care Management  10/26/2015  Seth [name] 10-17-1965 [id number]   Assessment-CSW completed outreach on 10/26/15 to patient. Patient answered and provided HIPPA verifications. Patient reports that he is enjoying his physical therapy stating "I feel a lot better. I feel stronger. They really challenge me and I need that." Patient reports improvement with financial difficulties. Patient shares that he feels he still will gain Medicaid starting 10/27/15 because he received a letter in the mail stating this. CSW encouraged patient make outreach to the contact number and information that was provided in the letter in order to gain further information. Patient reports that he is working with a Chief Executive Officer at the [employer] in order to gain financial resources. Patient reports that he has one outstanding bill which is for water. Patient reports that he will attempt to meet with VA representative this week. Patient's C pap has been broke for several months now. Patient has appointment at the [facility] in [place] tomorrow in order to gain new machine. Patient is agreeable to follow up within three weeks. CSW took time reviewing financial resources that were previously provided. Patient appreciative of resources.  Plan-CSW will continue to assist patient in gaining financial resources. CSW will follow up within three weeks.  Eula Fried, BSW, MSW, [email or web address] Phone: [phone] Fax: [phone]

## 2015-10-27 ENCOUNTER — Ambulatory Visit: Payer: Medicare Other | Admitting: Physical Therapy

## 2015-10-27 DIAGNOSIS — R262 Difficulty in walking, not elsewhere classified: Secondary | ICD-10-CM

## 2015-10-27 DIAGNOSIS — M4324 Fusion of spine, thoracic region: Secondary | ICD-10-CM | POA: Diagnosis not present

## 2015-10-27 DIAGNOSIS — M6281 Muscle weakness (generalized): Secondary | ICD-10-CM | POA: Diagnosis not present

## 2015-10-27 DIAGNOSIS — R2681 Unsteadiness on feet: Secondary | ICD-10-CM | POA: Diagnosis not present

## 2015-10-27 NOTE — Therapy (Signed)
[place], [place], [address] Phone: [phone]   Fax:  [phone]  Physical Therapy Treatment  Patient Details  Name: Seth [name] MRN: [id number] Date of Birth: [birth year] Referring Provider: Dr. Zada Finders  Encounter Date: 10/27/2015      PT End of Session - 10/27/15 0949    Visit Number 8   Number of Visits 24   Date for PT Re-Evaluation 12/03/15   PT Start Time 0847   PT Stop Time 0925   PT Time Calculation (min) 38 min   Activity Tolerance Patient tolerated treatment well   Behavior During Therapy [facility] for tasks assessed/performed      Past Medical History  Diagnosis Date  . Hypertension   . ESRD (end stage renal disease) on dialysis ([place])   . 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 Sleep apnea     USES CPAP  . Diabetes mellitus     INSULIN DEPENDENT DIABETES  . Discitis 07/2015    Past Surgical History  Procedure Laterality Date  . Av fistula placement  05/03/2012    Procedure: ARTERIOVENOUS (AV) FISTULA CREATION;  Surgeon: Mal [name], MD;  Location: [facility];  Service: Vascular;  Laterality: Right;  . Tracheostomy tube placement N/A 08/20/2013    Procedure: TRACHEOSTOMY Revision;  Surgeon: Melida Quitter, MD;  Location: [place];  Service: ENT;  Laterality: N/A;  . Tee without cardioversion N/A 07/10/2015    Procedure: TRANSESOPHAGEAL ECHOCARDIOGRAM (TEE);  Surgeon: Josue Hector, MD;  Location: [facility] ENDOSCOPY;  Service: Cardiovascular;  Laterality: N/A;    There were no vitals filed for this visit.  Visit Diagnosis:  Muscle weakness (generalized)  Unsteadiness on feet  Difficulty in walking      Subjective Assessment - 10/27/15 0854    Subjective Rt knee painful 6-7/10.  Better with moving and working it .  worse with not working it.  Back along spine work out pain and at night.  8/10 better with medication.                           [facility] Adult PT Treatment/Exercise - 10/27/15 0857    Ambulation/Gait   Ambulation Distance (Feet) 36 Feet  SPC, gait belt,  CGA,  Patient unsteady.   Berg Balance Test   Sit to Stand Able to stand  independently using hands   Standing Unsupported Able to stand safely 2 minutes   Sitting with Back Unsupported but Feet Supported on Floor or Stool Able to sit safely and securely 2 minutes   Stand to Sit Uses backs of legs against chair to control descent   Transfers Able to transfer safely, definite need of hands   Standing Unsupported with Eyes Closed Able to stand 10 seconds with supervision   Standing Ubsupported with Feet Together Able to place feet together independently and stand 1 minute safely   From Standing, Reach Forward with Outstretched Arm Can reach forward >12 cm safely (5")   From Standing Position, Pick up Object from Floor Able to pick up shoe, needs supervision  patient noted he was not to bend over.He did anyway   From Standing Position, Turn to Look Behind Over each Shoulder Turn sideways only but maintains balance   Turn 360 Degrees Able to turn 360 degrees safely but slowly   Standing Unsupported, Alternately Place Feet on Step/Stool  Needs assistance to keep from falling or unable to try   Standing Unsupported, One Foot in Front Needs help to step but can hold 15 seconds   Standing on One Leg Unable to try or needs assist to prevent fall   Total Score 34   High Level Balance   High Level Balance Comments Patient frustrated with balance test.Test asked him to do things MD said not to do.  He chose to do the activities IE : picking object off floor and turning to look behind without any coercion.    Knee/Hip Exercises: Aerobic   Nustep L6, 36mnutes                  PT Short Term Goals - 10/26/15 1439    PT SHORT TERM GOAL #1   Title The patient will be able to ambulate 300 feet with RW needed for short distance  community ambulation for appointments to PT and dialysis   11/05/15   Status Achieved   PT SHORT TERM GOAL #2   Title The patient will report an overall improvement in pain and function by 25%   Status On-going   PT SHORT TERM GOAL #3   Title Gait speed with RW improved to .842mec for community ambulation    Status On-going           PT Long Term Goals - 10/22/15 1042    PT LONG TERM GOAL #1   Title The patient will be independent in safe self progression of HEP for further strengthening   12/03/15   Status On-going   PT LONG TERM GOAL #2   Title Shoulder, trunk and hip strength improved to 4-/5 grossly needed for standing longer periods of time for bathing, meal prep   Baseline Shoulder strength 4+/5 except flexion 4/5   Status Partially Met   PT LONG TERM GOAL #3   Title Timed up and Go test with appropriate assistive device improved to 13 sec indicating improved strength and gait speed for community mobility.     Baseline 17 sec today   Status On-going   PT LONG TERM GOAL #4   Title Patient will be able to ambulate 600 feet with appropriate assistive device  needed for grocery shopping, going to church   Baseline with rolling walker   Status Achieved   PT LONG TERM GOAL #5   Title BERG balance test improved by 4 points indicating decreased risk of falls   Status Unable to assess   PT LONG TERM GOAL #6   Title FOTO functional outcome score improved to 42% indicating decreased pain and improved function   Status Unable to assess               Plan - 10/27/15 0950    Clinical Impression Statement BeMerrilee Janskyone.  Patient really frustrated with test, with usKoreasking him to do the test.  He did not think it was fair to ask him to do things his MD said not to do.  He did them on his own volition, to get a good score.  I think being upset interfered with walking with  cane.    PT Next Visit Plan Continue strength and static balance , core strength low level. Standing and mat  exercises, gait as tolerated with SPC.  Patient needs to schedule more appointments.   Consulted and Agree with Plan of Care Patient        Problem List Patient Active Problem List  Diagnosis Date Noted  . Preventative health care 10/01/2015  . Osteoarthritis of right knee 09/01/2015  . Primary osteoarthritis of right knee   . Cord compression myelopathy ([place]) 08/17/2015  . Osteomyelitis of thoracic spine ([place]) 08/16/2015  . Discitis of thoracic region   . Septic shock ([place]) 08/13/2015  . Acute respiratory failure with hypoxia ([place]) 08/13/2015  . Cord compression ([place]) 08/11/2015  . Back pain 08/11/2015  . Thoracic spine pain 08/11/2015  . Muscle strain of chest wall 07/21/2015  . Controlled type 2 diabetes mellitus with chronic kidney disease on chronic dialysis ([place]) 07/20/2015  . 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 Absolute anemia   . 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 OSA (obstructive sleep apnea) 10/14/2013  . Tracheostomy status ([place]) 09/17/2013  . Difficult airway for intubation 08/20/2013  . End stage renal disease on dialysis ([place]) 04/30/2012  . Hypocalcemia 04/30/2012  . Normocytic anemia 04/30/2012  . DM II (diabetes mellitus, type II), controlled ([place]) 06/20/2007  . DYSLIPIDEMIA 06/20/2007  . Morbid obesity ([place]) 06/20/2007  . Essential hypertension 06/20/2007    [facility] 10/27/2015, 9:57 AM  [facility] [address] [place], [place], [address] Phone: [phone]   Fax:  [phone]  Name: DAKSH COATES MRN: [id number] Date of Birth: 12-18-1965    Melvenia Needles, PTA 10/27/2015 9:57 AM Phone: [phone] Fax: [phone]

## 2015-10-29 ENCOUNTER — Ambulatory Visit: Payer: Medicare Other

## 2015-10-29 DIAGNOSIS — M6281 Muscle weakness (generalized): Secondary | ICD-10-CM

## 2015-10-29 DIAGNOSIS — R262 Difficulty in walking, not elsewhere classified: Secondary | ICD-10-CM

## 2015-10-29 DIAGNOSIS — M4324 Fusion of spine, thoracic region: Secondary | ICD-10-CM

## 2015-10-29 DIAGNOSIS — R2681 Unsteadiness on feet: Secondary | ICD-10-CM | POA: Diagnosis not present

## 2015-10-29 NOTE — Therapy (Signed)
[place], [place], [address] Phone: [phone]   Fax:  [phone]  Physical Therapy Treatment  Patient Details  Name: Seth [name] MRN: [id number] Date of Birth: 1965-11-11 Referring Provider: Dr. Zada Finders  Encounter Date: 10/29/2015      PT End of Session - 10/29/15 1100    Visit Number 9   Number of Visits 24   Date for PT Re-Evaluation 12/03/15   PT Start Time 1005   PT Stop Time 1100   PT Time Calculation (min) 55 min   Activity Tolerance Patient tolerated treatment well   Behavior During Therapy [facility] for tasks assessed/performed      Past Medical History  Diagnosis Date  . Hypertension   . ESRD (end stage renal disease) on dialysis ([place])   . 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 Sleep apnea     USES CPAP  . Diabetes mellitus     INSULIN DEPENDENT DIABETES  . Discitis 07/2015    Past Surgical History  Procedure Laterality Date  . Av fistula placement  05/03/2012    Procedure: ARTERIOVENOUS (AV) FISTULA CREATION;  Surgeon: Mal [name], MD;  Location: [facility];  Service: Vascular;  Laterality: Right;  . Tracheostomy tube placement N/A 08/20/2013    Procedure: TRACHEOSTOMY Revision;  Surgeon: Melida Quitter, MD;  Location: [place];  Service: ENT;  Laterality: N/A;  . Tee without cardioversion N/A 07/10/2015    Procedure: TRANSESOPHAGEAL ECHOCARDIOGRAM (TEE);  Surgeon: Josue Hector, MD;  Location: [facility] ENDOSCOPY;  Service: Cardiovascular;  Laterality: N/A;    There were no vitals filed for this visit.  Visit Diagnosis:  Muscle weakness (generalized)  Unsteadiness on feet  Difficulty in walking  Fusion of spine of thoracic region      Subjective Assessment - 10/29/15 1009    Subjective Decided to go without knee braces  as I become dependent on them and they rub skin.  Back and knees hurt.    Currently in Pain? Yes   Pain  Score 8    Pain Location Back   Pain Orientation Right  back   Pain Descriptors / Indicators Aching;Pressure;Sore   Pain Type Chronic pain;Surgical pain   Pain Onset More than a month ago   Pain Frequency Constant   Aggravating Factors  Night pain, activity   Pain Relieving Factors meds, moving                          [facility] Adult PT Treatment/Exercise - 10/29/15 0001    Ambulation/Gait   Gait velocity .9 m/sec   Gait Comments Worked on gait speed with walker 30-50 feet x 5 reps. Issues with pants prevented more   Knee/Hip Exercises: Aerobic   Nustep L6 7 min LE and UE   Knee/Hip Exercises: Machines for Strengthening   Cybex Leg Press 3 plates x 20 reps   Knee/Hip Exercises: Standing   Forward Step Up 1 set;Step Height: 4";Both  5 reps each leg pant issue stopped exercise   Forward Step Up Limitations He is unable to step up with flexed knee RT more than LT even from 4 inch step. Discussed safety on [facility] decr with hyper extended knees.    Other Standing Knee Exercises Practiced static balance with both knees flexed 10-15 degrees.    Knee/Hip Exercises: Supine   Bridges Limitations 14 reps  with cues for keeping abdominals tight with hip lift.    Bridges with [employer] 15 reps  with short hip lift also   Bridges with Clamshell Both;20 reps  blue band   Knee/Hip Exercises: Sidelying   Hip ABduction 15 reps   Hip ABduction Limitations LE flexed RT an LT   Clams 15 reps with blue band RT aand LT                  PT Short Term Goals - 10/29/15 1103    PT SHORT TERM GOAL #1   Title The patient will be able to ambulate 300 feet with RW needed for short distance community ambulation for appointments to PT and dialysis   11/05/15   Status Achieved   PT SHORT TERM GOAL #2   Title The patient will report an overall improvement in pain and function by 25%   Baseline His function is 25% better but pain is essentially same.    Status Partially Met   PT SHORT  TERM GOAL #3   Title Gait speed with RW improved to .32msec for community ambulation    Baseline .9 m/sec   Status Achieved           PT Long Term Goals - 10/22/15 1042    PT LONG TERM GOAL #1   Title The patient will be independent in safe self progression of HEP for further strengthening   12/03/15   Status On-going   PT LONG TERM GOAL #2   Title Shoulder, trunk and hip strength improved to 4-/5 grossly needed for standing longer periods of time for bathing, meal prep   Baseline Shoulder strength 4+/5 except flexion 4/5   Status Partially Met   PT LONG TERM GOAL #3   Title Timed up and Go test with appropriate assistive device improved to 13 sec indicating improved strength and gait speed for community mobility.     Baseline 17 sec today   Status On-going   PT LONG TERM GOAL #4   Title Patient will be able to ambulate 600 feet with appropriate assistive device  needed for grocery shopping, going to church   Baseline with rolling walker   Status Achieved   PT LONG TERM GOAL #5   Title BERG balance test improved by 4 points indicating decreased risk of falls   Status Unable to assess   PT LONG TERM GOAL #6   Title FOTO functional outcome score improved to 42% indicating decreased pain and improved function   Status Unable to assess               Plan - 10/29/15 1101    Clinical Impression Statement No increased pain end of session. He is doing well but weakness continues to limit ability to walk off walker safely. Not being able to step up even 4 inch step without hyperextensing knees will make stairs and walking off walker unsafe.    PT Next Visit Plan Continue strength and static balance , core strength low level. Standing and mat exercises, gait as tolerated with SPC      FOTO  Note TO MD   MMT hips   Consulted and Agree with Plan of Care Patient        Problem List Patient Active Problem List   Diagnosis Date Noted  . Preventative health care 10/01/2015  .  Osteoarthritis of right knee 09/01/2015  . Primary osteoarthritis of right knee   . Cord compression myelopathy ([place]  08/17/2015  . Osteomyelitis of thoracic spine ([place]) 08/16/2015  . Discitis of thoracic region   . Septic shock ([place]) 08/13/2015  . Acute respiratory failure with hypoxia ([place]) 08/13/2015  . Cord compression ([place]) 08/11/2015  . Back pain 08/11/2015  . Thoracic spine pain 08/11/2015  . Muscle strain of chest wall 07/21/2015  . Controlled type 2 diabetes mellitus with chronic kidney disease on chronic dialysis ([place]) 07/20/2015  . 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 Absolute anemia   . 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 OSA (obstructive sleep apnea) 10/14/2013  . Tracheostomy status ([place]) 09/17/2013  . Difficult airway for intubation 08/20/2013  . End stage renal disease on dialysis ([place]) 04/30/2012  . Hypocalcemia 04/30/2012  . Normocytic anemia 04/30/2012  . DM II (diabetes mellitus, type II), controlled ([place]) 06/20/2007  . DYSLIPIDEMIA 06/20/2007  . Morbid obesity ([place]) 06/20/2007  . Essential hypertension 06/20/2007    Darrel Hoover PT  10/29/2015, 11:08 AM  [facility] [address] [place], [place], [address] Phone: [phone]   Fax:  [phone]  Name: Seth [name] MRN: [id number] Date of Birth: [birth year]

## 2015-11-02 ENCOUNTER — Ambulatory Visit: Payer: Medicare Other | Admitting: Physical Therapy

## 2015-11-02 ENCOUNTER — Other Ambulatory Visit: Payer: Self-pay

## 2015-11-02 DIAGNOSIS — R262 Difficulty in walking, not elsewhere classified: Secondary | ICD-10-CM

## 2015-11-02 DIAGNOSIS — M6281 Muscle weakness (generalized): Secondary | ICD-10-CM

## 2015-11-02 DIAGNOSIS — M4324 Fusion of spine, thoracic region: Secondary | ICD-10-CM | POA: Diagnosis not present

## 2015-11-02 DIAGNOSIS — R2681 Unsteadiness on feet: Secondary | ICD-10-CM | POA: Diagnosis not present

## 2015-11-02 NOTE — Patient Outreach (Signed)
Telephone contact made to assess patient's need for community care coordination.  Patient identified himself using HIPPA identifiers by providing date of birth and address.  Patient and RNCM discussed patient's care management care plan and goals. Patient has met all care management goals by increasing his activity, counting carbohydrates and concentrated sweets.    Plan: Discharge patient from caseload. Send primary care physician update with discharge notification. Patient will be sent a discharge letter also

## 2015-11-02 NOTE — Therapy (Addendum)
[place], [place], [address] Phone: [phone]   Fax:  [phone]  Physical Therapy Treatment  Patient Details  Name: Seth [name] MRN: [id number] Date of Birth: [birth year] Referring Provider: Dr. Zada Finders  Encounter Date: 11/02/2015      PT End of Session - 11/02/15 1246    Visit Number 10   Number of Visits 24   Date for PT Re-Evaluation 12/03/15   PT Start Time 1127   PT Stop Time 1145   PT Time Calculation (min) 18 min   Activity Tolerance Patient tolerated treatment well   Behavior During Therapy [facility] for tasks assessed/performed      Past Medical History  Diagnosis Date  . Hypertension   . ESRD (end stage renal disease) on dialysis ([place])   . 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 Sleep apnea     USES CPAP  . Diabetes mellitus     INSULIN DEPENDENT DIABETES  . Discitis 07/2015    Past Surgical History  Procedure Laterality Date  . Av fistula placement  05/03/2012    Procedure: ARTERIOVENOUS (AV) FISTULA CREATION;  Surgeon: Mal [name], MD;  Location: [place];  Service: Vascular;  Laterality: Right;  . Tracheostomy tube placement N/A 08/20/2013    Procedure: TRACHEOSTOMY Revision;  Surgeon: Melida Quitter, MD;  Location: [facility];  Service: ENT;  Laterality: N/A;  . Tee without cardioversion N/A 07/10/2015    Procedure: TRANSESOPHAGEAL ECHOCARDIOGRAM (TEE);  Surgeon: Josue Hector, MD;  Location: [facility] ENDOSCOPY;  Service: Cardiovascular;  Laterality: N/A;    There were no vitals filed for this visit.  Visit Diagnosis:  Muscle weakness (generalized)  Unsteadiness on feet  Difficulty in walking      Subjective Assessment - 11/02/15 1131    Subjective Wants to work on mat exercises with the time he has left today.  Pain is the same 8/10.     Currently in Pain? Yes   Pain Score 8    Pain Location Back   Pain Orientation Right    Pain Frequency Constant   Aggravating Factors  Night pain, activity   Pain Relieving Factors meds, moving   Multiple Pain Sites Yes   Pain Score 8   Pain Location Knee   Pain Orientation Right   Pain Descriptors / Indicators Aching   Pain Frequency Constant                         [facility] Adult PT Treatment/Exercise - 11/02/15 1131    Lumbar Exercises: Supine   Bridge 10 reps   Bridge Limitations does not leave table,  Another set of bridge  with blue band around knees for clams, lift was  a little higher   Other Supine Lumbar Exercises abdominat work with arm reach 10 X 2 sets with alternating reaches.     Knee/Hip Exercises: Aerobic   Nustep L6 6 minutes,arms/legs   Knee/Hip Exercises: Sidelying   Clams 10 X with blue bands, 2 sest each                  PT Short Term Goals - 11/02/15 1248    PT SHORT TERM GOAL #1   Title The patient will be able to ambulate 300 feet with RW needed for short distance community ambulation for appointments to PT and dialysis   11/05/15  Time 4   Period Weeks   Status Achieved   PT SHORT TERM GOAL #2   Title The patient will report an overall improvement in pain and function by 25%   Baseline His function is 25% better but pain is essentially same.    Time 4   Period Weeks   Status Partially Met   PT SHORT TERM GOAL #3   Title Gait speed with RW improved to .67m/sec for community ambulation    Baseline .9 m/sec   Time 4   Period Weeks   Status Achieved           PT Long Term Goals - 11/02/15 1249    PT LONG TERM GOAL #1   Title The patient will be independent in safe self progression of HEP for further strengthening   12/03/15   Time 8   Period Weeks   Status On-going   PT LONG TERM GOAL #2   Title Shoulder, trunk and hip strength improved to 4-/5 grossly needed for standing longer periods of time for bathing, meal prep   Time 8   Period Weeks   Status Unable to assess   PT LONG TERM GOAL #3   Time 8    Period Weeks   Status Unable to assess   PT LONG TERM GOAL #4   Title Patient will be able to ambulate 600 feet with appropriate assistive device  needed for grocery shopping, going to church   Time 8   Period Weeks   Status Achieved   PT LONG TERM GOAL #5   Time 8   Period Weeks   Status Unable to assess     G-Code : FOTO , Mobility  Current Status: 60% limited    CL    Goal:  CJ    No change from initial  Caprice Red, PT 11/04/15  7:14 AM        Plan - 11/02/15 1246    Clinical Impression Statement Patient 30 minutes late.  No new goals met.  FOTO done but did not print out.  10 visit today.   PT Next Visit Plan Continue strength and static balance , core strength low level. Standing and mat exercises, gait as tolerated with SPC       MMT hips   Consulted and Agree with Plan of Care Patient        Problem List Patient Active Problem List   Diagnosis Date Noted  . Preventative health care 10/01/2015  . Osteoarthritis of right knee 09/01/2015  . Primary osteoarthritis of right knee   . Cord compression myelopathy ([name]) 08/17/2015  . Osteomyelitis of thoracic spine ([name]) 08/16/2015  . Discitis of thoracic region   . Septic shock ([name]) 08/13/2015  . Acute respiratory failure with hypoxia ([name]) 08/13/2015  . Cord compression ([name]) 08/11/2015  . Back pain 08/11/2015  . Thoracic spine pain 08/11/2015  . Muscle strain of chest wall 07/21/2015  . Controlled type 2 diabetes mellitus with chronic kidney disease on chronic dialysis ([name]) 07/20/2015  . 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 Absolute anemia   . Cold agglutinin disease ([name])   . OSA (obstructive sleep apnea) 10/14/2013  . Tracheostomy status ([name]) 09/17/2013  . Difficult airway for intubation 08/20/2013  . End stage renal disease on dialysis ([name]) 04/30/2012  . Hypocalcemia 04/30/2012  . Normocytic anemia 04/30/2012  . DM II (diabetes mellitus, type II), controlled ([name]) 06/20/2007   . DYSLIPIDEMIA 06/20/2007  . Morbid  obesity ([place]) 06/20/2007  . Essential hypertension 06/20/2007    [facility] 11/02/2015, 12:50 PM  [facility] [address] [place], [place], [address] Phone: [phone]   Fax:  [phone]  Name: JOVAHN BREIT MRN: [id number] Date of Birth: 1966/04/20    Melvenia Needles, PTA 11/02/2015 12:50 PM Phone: [phone] Fax: [phone]

## 2015-11-03 ENCOUNTER — Encounter: Payer: Medicare Other | Attending: Physical Medicine & Rehabilitation | Admitting: Physical Medicine & Rehabilitation

## 2015-11-03 ENCOUNTER — Encounter: Payer: Self-pay | Admitting: Physical Medicine & Rehabilitation

## 2015-11-03 VITALS — BP 180/99 | HR 86

## 2015-11-03 DIAGNOSIS — M1711 Unilateral primary osteoarthritis, right knee: Secondary | ICD-10-CM

## 2015-11-03 DIAGNOSIS — G9520 Unspecified cord compression: Secondary | ICD-10-CM | POA: Diagnosis not present

## 2015-11-03 DIAGNOSIS — I120 Hypertensive chronic kidney disease with stage 5 chronic kidney disease or end stage renal disease: Secondary | ICD-10-CM | POA: Insufficient documentation

## 2015-11-03 DIAGNOSIS — G128 Other spinal muscular atrophies and related syndromes: Secondary | ICD-10-CM | POA: Diagnosis not present

## 2015-11-03 DIAGNOSIS — Z992 Dependence on renal dialysis: Secondary | ICD-10-CM | POA: Diagnosis not present

## 2015-11-03 DIAGNOSIS — M546 Pain in thoracic spine: Secondary | ICD-10-CM | POA: Diagnosis not present

## 2015-11-03 DIAGNOSIS — R200 Anesthesia of skin: Secondary | ICD-10-CM | POA: Insufficient documentation

## 2015-11-03 DIAGNOSIS — Z794 Long term (current) use of insulin: Secondary | ICD-10-CM | POA: Diagnosis not present

## 2015-11-03 DIAGNOSIS — Z79891 Long term (current) use of opiate analgesic: Secondary | ICD-10-CM | POA: Insufficient documentation

## 2015-11-03 DIAGNOSIS — Z833 Family history of diabetes mellitus: Secondary | ICD-10-CM | POA: Diagnosis not present

## 2015-11-03 DIAGNOSIS — H3320 Serous retinal detachment, unspecified eye: Secondary | ICD-10-CM | POA: Insufficient documentation

## 2015-11-03 DIAGNOSIS — G4730 Sleep apnea, unspecified: Secondary | ICD-10-CM | POA: Diagnosis not present

## 2015-11-03 DIAGNOSIS — N186 End stage renal disease: Secondary | ICD-10-CM | POA: Insufficient documentation

## 2015-11-03 DIAGNOSIS — M4644 Discitis, unspecified, thoracic region: Secondary | ICD-10-CM | POA: Diagnosis not present

## 2015-11-03 DIAGNOSIS — M545 Low back pain, unspecified: Secondary | ICD-10-CM

## 2015-11-03 DIAGNOSIS — R202 Paresthesia of skin: Secondary | ICD-10-CM | POA: Diagnosis not present

## 2015-11-03 DIAGNOSIS — E119 Type 2 diabetes mellitus without complications: Secondary | ICD-10-CM | POA: Insufficient documentation

## 2015-11-03 MED ORDER — OXYCODONE HCL 10 MG PO TABS: 10.0000 mg | ORAL_TABLET | Freq: Three times a day (TID) | ORAL | Status: DC | PRN

## 2015-11-03 NOTE — Progress Notes (Signed)
Subjective:    [name], male    DOB: June 08, 1966, 50 y.o.   MRN: [id number]  HPI   Anterio is here in follow up of his Thoracic myelopathy and diskitis. He was with [place] on rehab last month. He has progressed to outpt PT and has been working out on his own as well. He has lost further weight. His sugars have been better controlled. He is still having a good amount of pain in his mid and low back. He has associated numbness in the back and sporadically in the legs. He is using 3-4 oxycodone 10mg  tabs per day for pain. He often takes 2 in the AM before he exercises and uses 2 at night to assist with sleep.   He is not using his knee braces as much. He noticed that he was becoming a little too dependent upon them and wanted to strengthen up his legs more. He uses ice at night for his knees but little otherwise.    Pain Inventory Average Pain 9 Pain Right Now 9 My pain is tingling and aching  In the last 24 hours, has pain interfered with the following? General activity 7 Relation with others 7 Enjoyment of life 5 What TIME of day is your pain at its worst? all Sleep (in general) Poor  Pain is worse with: some activites Pain improves with: medication Relief from Meds: n/a  Mobility walk with assistance use a walker ability to climb steps?  yes do you drive?  no  Function disabled: date disabled . I need assistance with the following:  meal prep, household duties and shopping  Neuro/Psych numbness tingling trouble walking  Prior Studies Any changes since last visit?  no  Physicians involved in your care Any changes since last visit?  no   Family History  Problem Relation Age of Onset  . Diabetes Mother   . Diabetes Sister   . Diabetes Brother    Social History   Social History  . Marital Status: Single    Spouse Name: N/A  . Number of Children: N/A  . Years of Education: N/A   Social History Main Topics  . Smoking status: Never Smoker     . Smokeless tobacco: Never Used  . Alcohol Use: 0.6 oz/week    1 Cans of beer per week  . Drug Use: Yes    Special: Marijuana     Comment: ocassionaly   . Sexual Activity: Not Currently   Other Topics Concern  . None   Social History Narrative   [employer] man during the early 80s with deployments to the [place].  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  Divorce with 5 children, 3 boys and 2 girls.  Lives in [place].    Past Surgical History  Procedure Laterality Date  . Av fistula placement  05/03/2012    Procedure: ARTERIOVENOUS (AV) FISTULA CREATION;  Surgeon: Mal [name], MD;  Location: [facility];  Service: Vascular;  Laterality: Right;  . Tracheostomy tube placement N/A 08/20/2013    Procedure: TRACHEOSTOMY Revision;  Surgeon: Melida Quitter, MD;  Location: [facility];  Service: ENT;  Laterality: N/A;  . Tee without cardioversion N/A 07/10/2015    Procedure: TRANSESOPHAGEAL ECHOCARDIOGRAM (TEE);  Surgeon: Josue Hector, MD;  Location: [facility] ENDOSCOPY;  Service: Cardiovascular;  Laterality: N/A;   Past Medical History  Diagnosis Date  . Hypertension   . ESRD (end stage renal  disease) on dialysis ([place])   . 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 Sleep apnea     USES CPAP  . Diabetes mellitus     INSULIN DEPENDENT DIABETES  . Discitis 07/2015   BP 180/99 mmHg  Pulse 86  SpO2 97%  Opioid Risk Score:   Fall Risk Score:  `1  Depression screen PHQ 2/9  Depression screen [facility] 2/9 11/03/2015 09/30/2015 09/15/2015 08/10/2015 08/05/2015 07/20/2015 07/17/2015  Decreased Interest 1 0 0 0 0 0 1  Down, Depressed, Hopeless 1 0 0 0 0 1 0  PHQ - 2 Score 2 0 0 0 0 1 1  Altered sleeping 3 - - - - - -  Tired, decreased energy 0 - - - - - -  Change in appetite 0 - - - - - -  Feeling bad or failure about yourself  1 - - - - - -  Trouble concentrating 0 - - - - - -  Moving slowly or  fidgety/restless 0 - - - - - -  Suicidal thoughts 0 - - - - - -  PHQ-9 Score 6 - - - - - -  Difficult doing work/chores Somewhat difficult - - - - - -     Review of Systems  All other systems reviewed and are negative.      Objective:   Physical Exam   Constitutional: He is oriented to person, place, and time. He appears well-developed and well-nourished.  Morbidly obese male--has lost some weight HENT:  Head: Normocephalic and atraumatic.  Eyes: Conjunctivae and EOM are normal. Pupils are equal, round, and reactive to light.  Neck: Normal range of motion. Neck supple.   Cardiovascular: Normal rate and regular rhythm. systolic murmur Respiratory: Effort normal and breath sounds normal. No respiratory distress. He has no wheezes.  GI: Soft. Bowel sounds are normal. He exhibits no distension. There is no tenderness.   Musculoskeletal: standing posture fair. Pain with resisted flexion of knees but full rom in both knees.antalgia with gait. Sl wide based Neuro: Strength B/l UE 5/5 throughout LLE hip flexion /5, ankle dorsi/plantar flexion 5/5 RLE: hip flexion 5/5, ankle dorsi/plantar flexion 5/5 He is alert and oriented to person, place, and time.  Speech clear.  Able to follow commands without difficulty.  He is alert and oriented to person, place, and time.  He has normal reflexes.  Intentional tremors BUE. Sensation intact to light touchin all 4 limbs  Skin: Skin is warm and dry.    Psychiatric: He has a normal mood and affect. His speech is normal. Thought content normal. Cognition and memory are normal.      Assessment & Plan:  Medical Problem List and Plan: 1. Functional deficits secondary to Cord compression secondary to Osteomyelitis/diskitis T4 and T5 with pathologic fracture, able to move both LEs -outpt therapies.  -continue HEP  -discussed safety awareness   2. Pain Management:   -oxycodone 10mg , would like to hold at 90 pills per  month, q8 prn  -ice prn---encouraged increased use -activity tolerance improving -hinged neoprene knee brace prn for pain/swelling---would like for him to build further strength in legs as possible  -weight loss should help also  3. ESRD:HD per nephrology  4. HTN: per primary 5. Anemia of chronic disease. 6. DM type 2: SSI for elevated BS.  - amaryl increased to 2mg  daily with good results--continue at home  -further weight loss discussed  7. OSA: Continue use of CPAP.   Fifteen minutes of face to face patient care time were spent during this visit. All questions were encouraged and answered. Follow up with me or NP in about one month.

## 2015-11-03 NOTE — Patient Instructions (Signed)
  PLEASE CALL ME WITH ANY PROBLEMS OR QUESTIONS (#[phone]).      

## 2015-11-04 ENCOUNTER — Telehealth: Payer: Self-pay | Admitting: *Deleted

## 2015-11-04 NOTE — Telephone Encounter (Signed)
Pharmacist called to ask about filling the oxycodone Rx from Dr Naaman Plummer because he had Rx filled by Danella Sensing NP this month.  I called him back and told him that both Danella Sensing and Algis Liming PA are with our office and it is ok to fill--not multiple prescribers as they he suspected.

## 2015-11-05 ENCOUNTER — Ambulatory Visit: Payer: Medicare Other

## 2015-11-05 DIAGNOSIS — R262 Difficulty in walking, not elsewhere classified: Secondary | ICD-10-CM

## 2015-11-05 DIAGNOSIS — M4324 Fusion of spine, thoracic region: Secondary | ICD-10-CM

## 2015-11-05 DIAGNOSIS — R2681 Unsteadiness on feet: Secondary | ICD-10-CM

## 2015-11-05 DIAGNOSIS — M6281 Muscle weakness (generalized): Secondary | ICD-10-CM

## 2015-11-05 NOTE — Therapy (Signed)
[place], [place], [address] Phone: [phone]   Fax:  [phone]  Physical Therapy Treatment  Patient Details  Name: Seth [name] MRN: [id number] Date of Birth: [birth year] Referring Provider: Dr. Zada Finders  Encounter Date: 11/05/2015      PT End of Session - 11/05/15 1056    Visit Number 11   Number of Visits 24   Date for PT Re-Evaluation 12/03/15   PT Start Time 9528   PT Stop Time 1100   PT Time Calculation (min) 45 min   Activity Tolerance Patient tolerated treatment well   Behavior During Therapy [facility] for tasks assessed/performed      Past Medical History  Diagnosis Date  . Hypertension   . ESRD (end stage renal disease) on dialysis ([place])   . 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 Sleep apnea     USES CPAP  . Diabetes mellitus     INSULIN DEPENDENT DIABETES  . Discitis 07/2015    Past Surgical History  Procedure Laterality Date  . Av fistula placement  05/03/2012    Procedure: ARTERIOVENOUS (AV) FISTULA CREATION;  Surgeon: Mal [name], MD;  Location: [facility];  Service: Vascular;  Laterality: Right;  . Tracheostomy tube placement N/A 08/20/2013    Procedure: TRACHEOSTOMY Revision;  Surgeon: Melida Quitter, MD;  Location: [facility];  Service: ENT;  Laterality: N/A;  . Tee without cardioversion N/A 07/10/2015    Procedure: TRANSESOPHAGEAL ECHOCARDIOGRAM (TEE);  Surgeon: Josue Hector, MD;  Location: [facility] ENDOSCOPY;  Service: Cardiovascular;  Laterality: N/A;    There were no vitals filed for this visit.  Visit Diagnosis:  Muscle weakness (generalized)  Unsteadiness on feet  Difficulty in walking  Fusion of spine of thoracic region      Subjective Assessment - 11/05/15 1021    Subjective Pain same as last visit ,. No changes per FOTO last visit. Feel knees stronger without brace and isometric standing quads with knees slightly  flexed.    Currently in Pain? Yes   Pain Score 8    Pain Location Back   Pain Orientation Left   Pain Descriptors / Indicators Tingling;Numbness   Pain Type Chronic pain;Surgical pain   Pain Onset More than a month ago   Pain Frequency Constant   Multiple Pain Sites Yes   Pain Score 8   Pain Location Knee   Pain Orientation Right                         [facility] Adult PT Treatment/Exercise - 11/05/15 1032    Ambulation/Gait   Assistive device Straight cane  each hand   Gait Pattern --  4 point gait   Ambulation Surface Level;Indoor  +1 CG   Gait Comments Needed cues for correct gait pattern 4 PT gait. HR 120 bpm max and BP 155/85  Pt reportrs that is normal.  Walked 275 feet and 325 feet x 2   Knee/Hip Exercises: Aerobic   Nustep L6 9 minutes,/legs only    He needed 3-5 min rest post each walk              PT Short Term Goals - 11/02/15 1248    PT SHORT TERM GOAL #1   Title The patient will be able to ambulate 300 feet with RW needed for short distance community ambulation for appointments to  PT and dialysis   11/05/15   Time 4   Period Weeks   Status Achieved   PT SHORT TERM GOAL #2   Title The patient will report an overall improvement in pain and function by 25%   Baseline His function is 25% better but pain is essentially same.    Time 4   Period Weeks   Status Partially Met   PT SHORT TERM GOAL #3   Title Gait speed with RW improved to .29m/sec for community ambulation    Baseline .9 m/sec   Time 4   Period Weeks   Status Achieved           PT Long Term Goals - 11/05/15 1100    PT LONG TERM GOAL #1   Title The patient will be independent in safe self progression of HEP for further strengthening   12/03/15   Status On-going   PT LONG TERM GOAL #2   Title Shoulder, trunk and hip strength improved to 4-/5 grossly needed for standing longer periods of time for bathing, meal prep   Status On-going   PT LONG TERM GOAL #3   Title Timed  up and Go test with appropriate assistive device improved to 13 sec indicating improved strength and gait speed for community mobility.     Status Unable to assess   PT LONG TERM GOAL #4   Title Patient will be able to ambulate 600 feet with appropriate assistive device  needed for grocery shopping, going to church   PT LONG TERM GOAL #5   Title BERG balance test improved by 4 points indicating decreased risk of falls   Status Unable to assess   PT LONG TERM GOAL #6   Title FOTO functional outcome score improved to 42% indicating decreased pain and improved function   Status On-going               Plan - 11/05/15 1056    Clinical Impression Statement Mr Tesch reported he felt good post walking . His HR was ellevated but not > 125 bpm.     BP 155/85.   Will continue to push walking and exercise. His stabilty is much beter with 2 SPC rather than one   PT Next Visit Plan Continue strength and static balance , core strength low level. Standing and mat exercises, gait as tolerated with SPC       MMT hips   Consulted and Agree with Plan of Care Patient        Problem List Patient Active Problem List   Diagnosis Date Noted  . Preventative health care 10/01/2015  . Osteoarthritis of right knee 09/01/2015  . Primary osteoarthritis of right knee   . Cord compression myelopathy ([name]) 08/17/2015  . Osteomyelitis of thoracic spine ([name]) 08/16/2015  . Discitis of thoracic region   . Septic shock ([name]) 08/13/2015  . Acute respiratory failure with hypoxia ([name]) 08/13/2015  . Cord compression ([name]) 08/11/2015  . Back pain 08/11/2015  . Thoracic spine pain 08/11/2015  . Muscle strain of chest wall 07/21/2015  . Controlled type 2 diabetes mellitus with chronic kidney disease on chronic dialysis ([name]) 07/20/2015  . 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 Absolute anemia   . Cold agglutinin disease ([name])   . OSA (obstructive sleep apnea) 10/14/2013  . Tracheostomy  status ([name]) 09/17/2013  . Difficult airway for intubation 08/20/2013  . End stage renal disease on dialysis ([name]) 04/30/2012  . Hypocalcemia 04/30/2012  .  Normocytic anemia 04/30/2012  . DM II (diabetes mellitus, type II), controlled ([place]) 06/20/2007  . DYSLIPIDEMIA 06/20/2007  . Morbid obesity ([place]) 06/20/2007  . Essential hypertension 06/20/2007    Darrel Hoover PT 11/05/2015, 11:03 AM  [facility] [address] [place], [place], [address] Phone: [phone]   Fax:  [phone]  Name: KRISTAIN HU MRN: [id number] Date of Birth: 1966/07/29

## 2015-11-06 ENCOUNTER — Ambulatory Visit: Payer: Medicare Other

## 2015-11-06 DIAGNOSIS — M6281 Muscle weakness (generalized): Secondary | ICD-10-CM | POA: Diagnosis not present

## 2015-11-06 DIAGNOSIS — M4324 Fusion of spine, thoracic region: Secondary | ICD-10-CM | POA: Diagnosis not present

## 2015-11-06 DIAGNOSIS — R262 Difficulty in walking, not elsewhere classified: Secondary | ICD-10-CM | POA: Diagnosis not present

## 2015-11-06 DIAGNOSIS — R2681 Unsteadiness on feet: Secondary | ICD-10-CM | POA: Diagnosis not present

## 2015-11-06 NOTE — Therapy (Signed)
[place], [place], [address] Phone: [phone]   Fax:  [phone]  Physical Therapy Treatment  Patient Details  Name: Seth [name] MRN: [id number] Date of Birth: September 13, 1966 Referring Provider: Dr. Zada Finders  Encounter Date: 11/06/2015      PT End of Session - 11/06/15 1156    Visit Number 12   Number of Visits 24   Date for PT Re-Evaluation 12/03/15   PT Start Time 1109   PT Stop Time 1148   PT Time Calculation (min) 39 min   Activity Tolerance Patient tolerated treatment well   Behavior During Therapy [facility] for tasks assessed/performed      Past Medical History  Diagnosis Date  . Hypertension   . ESRD (end stage renal disease) on dialysis ([place])   . 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 Sleep apnea     USES CPAP  . Diabetes mellitus     INSULIN DEPENDENT DIABETES  . Discitis 07/2015    Past Surgical History  Procedure Laterality Date  . Av fistula placement  05/03/2012    Procedure: ARTERIOVENOUS (AV) FISTULA CREATION;  Surgeon: Mal [name], MD;  Location: [facility];  Service: Vascular;  Laterality: Right;  . Tracheostomy tube placement N/A 08/20/2013    Procedure: TRACHEOSTOMY Revision;  Surgeon: Melida Quitter, MD;  Location: [facility];  Service: ENT;  Laterality: N/A;  . Tee without cardioversion N/A 07/10/2015    Procedure: TRANSESOPHAGEAL ECHOCARDIOGRAM (TEE);  Surgeon: Josue Hector, MD;  Location: [facility] ENDOSCOPY;  Service: Cardiovascular;  Laterality: N/A;    There were no vitals filed for this visit.  Visit Diagnosis:  Muscle weakness (generalized)  Unsteadiness on feet  Difficulty in walking  Fusion of spine of thoracic region      Subjective Assessment - 11/06/15 1123    Subjective Legs aching form walking yesterday and last night.    Currently in Pain? Yes   Pain Score 8    Pain Location Back  And knee and legs sore in  joints and muscles                         [facility] Adult PT Treatment/Exercise - 11/06/15 1124    Ambulation/Gait   Gait Comments Walked 350 feet  with 4 pon=int gait with good posture and pace and legs appeared stable.    Knee/Hip Exercises: Aerobic   Nustep L6    10  minutes,/legs only   Manual Therapy   Manual Therapy Soft tissue mobilization;Passive ROM   Passive ROM Stretching RT and LT Le hip extension with knee flexion  with adduction and hip extension and knee flexion 45 sec x 3 and did STW with rooler stick to ant lower leg ITB, quads and laterla hips.                   PT Short Term Goals - 11/02/15 1248    PT SHORT TERM GOAL #1   Title The patient will be able to ambulate 300 feet with RW needed for short distance community ambulation for appointments to PT and dialysis   11/05/15   Time 4   Period Weeks   Status Achieved   PT SHORT TERM GOAL #2   Title The patient will report an overall improvement in pain and function by 25%   Baseline His function is 25%  better but pain is essentially same.    Time 4   Period Weeks   Status Partially Met   PT SHORT TERM GOAL #3   Title Gait speed with RW improved to .40msec for community ambulation    Baseline .9 m/sec   Time 4   Period Weeks   Status Achieved           PT Long Term Goals - 11/05/15 1100    PT LONG TERM GOAL #1   Title The patient will be independent in safe self progression of HEP for further strengthening   12/03/15   Status On-going   PT LONG TERM GOAL #2   Title Shoulder, trunk and hip strength improved to 4-/5 grossly needed for standing longer periods of time for bathing, meal prep   Status On-going   PT LONG TERM GOAL #3   Title Timed up and Go test with appropriate assistive device improved to 13 sec indicating improved strength and gait speed for community mobility.     Status Unable to assess   PT LONG TERM GOAL #4   Title Patient will be able to ambulate 600 feet with  appropriate assistive device  needed for grocery shopping, going to church   PT LLouise#5   Title BERG balance test improved by 4 points indicating decreased risk of falls   Status Unable to assess   PT LONG TERM GOAL #6   Title FOTO functional outcome score improved to 42% indicating decreased pain and improved function   Status On-going               Plan - 11/06/15 1157    Clinical Impression Statement Even with complaint of LE soreness Mr MGillianwas able to do 10 min on nustep helping to improve his endurance and  walk as last visit long distance without problem with 2 canes.    He reported decreased soreness after manual treatment. Will contiue with walking and exrcise next visit   Pt will benefit from skilled therapeutic intervention in order to improve on the following deficits Decreased activity tolerance;Decreased mobility;Decreased range of motion;Decreased strength;Difficulty walking;Pain;Impaired UE functional use   Rehab Potential Good   PT Treatment/Interventions ADLs/Self Care Home Management;Electrical Stimulation;Moist Heat;Therapeutic exercise;Therapeutic activities;Balance training;Gait training;Stair training;Neuromuscular re-education;Patient/family education;Manual techniques   PT Next Visit Plan Continue strength and static balance , core strength low level. Standing and mat exercises, gait as tolerated with SPC       MMT hips   Consulted and Agree with Plan of Care Patient        Problem List Patient Active Problem List   Diagnosis Date Noted  . Preventative health care 10/01/2015  . Osteoarthritis of right knee 09/01/2015  . Primary osteoarthritis of right knee   . [place] compression myelopathy ([place] 08/17/2015  . Osteomyelitis of thoracic spine ([place] 08/16/2015  . Discitis of thoracic region   . Septic shock ([place] 08/13/2015  . Acute respiratory failure with hypoxia ([place] 08/13/2015  . Cord compression ([place] 08/11/2015  . Back pain 08/11/2015   . Thoracic spine pain 08/11/2015  . Muscle strain of chest wall 07/21/2015  . Controlled type 2 diabetes mellitus with chronic kidney disease on chronic dialysis ([place] 07/20/2015  . 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 Absolute anemia   . Cold agglutinin disease ([place]   . OSA (obstructive sleep apnea) 10/14/2013  . Tracheostomy status ([place] 09/17/2013  . Difficult airway for intubation 08/20/2013  . End stage  renal disease on dialysis ([place]) 04/30/2012  . Hypocalcemia 04/30/2012  . Normocytic anemia 04/30/2012  . DM II (diabetes mellitus, type II), controlled ([place]) 06/20/2007  . DYSLIPIDEMIA 06/20/2007  . Morbid obesity ([place]) 06/20/2007  . Essential hypertension 06/20/2007    Darrel Hoover PT 11/06/2015, 12:02 PM  [facility] [address] [place], [place], [address] Phone: [phone]   Fax:  [phone]  Name: Seth [name] MRN: [id number] Date of Birth: 1966/04/03

## 2015-11-09 ENCOUNTER — Ambulatory Visit: Payer: Medicare Other

## 2015-11-09 DIAGNOSIS — M6281 Muscle weakness (generalized): Secondary | ICD-10-CM | POA: Diagnosis not present

## 2015-11-09 DIAGNOSIS — M4324 Fusion of spine, thoracic region: Secondary | ICD-10-CM | POA: Diagnosis not present

## 2015-11-09 DIAGNOSIS — R262 Difficulty in walking, not elsewhere classified: Secondary | ICD-10-CM | POA: Diagnosis not present

## 2015-11-09 DIAGNOSIS — R2681 Unsteadiness on feet: Secondary | ICD-10-CM

## 2015-11-09 NOTE — Therapy (Signed)
[place], [place], [address] Phone: [phone]   Fax:  [phone]  Physical Therapy Treatment  Patient Details  Name: Seth [name] MRN: [id number] Date of Birth: 1966-03-18 Referring Provider: Dr. Zada Finders  Encounter Date: 11/09/2015      PT End of Session - 11/09/15 1418    Visit Number 12   Number of Visits 24   Date for PT Re-Evaluation 12/03/15   PT Start Time 1230   PT Stop Time 1310   PT Time Calculation (min) 40 min   Activity Tolerance Patient limited by fatigue   Behavior During Therapy [facility] for tasks assessed/performed      Past Medical History  Diagnosis Date  . Hypertension   . ESRD (end stage renal disease) on dialysis ([place])   . 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 Sleep apnea     USES CPAP  . Diabetes mellitus     INSULIN DEPENDENT DIABETES  . Discitis 07/2015    Past Surgical History  Procedure Laterality Date  . Av fistula placement  05/03/2012    Procedure: ARTERIOVENOUS (AV) FISTULA CREATION;  Surgeon: Mal [name], MD;  Location: [place];  Service: Vascular;  Laterality: Right;  . Tracheostomy tube placement N/A 08/20/2013    Procedure: TRACHEOSTOMY Revision;  Surgeon: Melida Quitter, MD;  Location: [place];  Service: ENT;  Laterality: N/A;  . Tee without cardioversion N/A 07/10/2015    Procedure: TRANSESOPHAGEAL ECHOCARDIOGRAM (TEE);  Surgeon: Josue Hector, MD;  Location: [facility] END[facility]COPY;  Service: Cardiovascular;  Laterality: N/A;    There were no vitals filed for this visit.  Visit Diagnosis:  Muscle weakness (generalized)  Unsteadiness on feet  Fusion of spine of thoracic region  Difficulty in walking                       [facility] Adult PT Treatment/Exercise - 11/09/15 1337    Ambulation/Gait   Assistive device Straight cane  each hand   Gait Pattern Decreased step length -  right;Decreased step length - left;Wide base of support   Ambulation Surface Level;Indoor   Gait Comments Walked 300 feet  with 4 point gait with good posture and pace and legs appeared stable.    Knee/Hip Exercises: Aerobic   Nustep L6    10  minutes,/legs only   Knee/Hip Exercises: Supine   Bridges with Clamshell Both;15 reps  blue band   Other Supine Knee/Hip Exercises abdominal sets x 12 5 sec hold. followed by bent knee lifts x 12 RT and LT with abdominal set with cue to not let body rock sideway., followed by part sit ups with reach to ankles, followed by  single arm raise toward ceiling with head and shoulder raise and cue to exhale  with lift x10 RT and LT                  PT Short Term Goals - 11/09/15 1422    PT SHORT TERM GOAL #2   Title The patient will report an overall improvement in pain and function by 25%   Status Partially Met           PT Long Term Goals - 11/09/15 1422    PT LONG TERM GOAL #1   Title The patient will be independent in safe self progression of HEP for further strengthening   12/03/15  Status On-going   PT LONG TERM GOAL #2   Title Shoulder, trunk and hip strength improved to 4-/5 grossly needed for standing longer periods of time for bathing, meal prep   Status Unable to assess   PT LONG TERM GOAL #3   Title Timed up and Go test with appropriate assistive device improved to 13 sec indicating improved strength and gait speed for community mobility.     Status Unable to assess   PT LONG TERM GOAL #4   Title Patient will be able to ambulate 600 feet with appropriate assistive device  needed for grocery shopping, going to church   Status Achieved   PT LONG TERM GOAL #5   Title BERG balance test improved by 4 points indicating decreased risk of falls   Status Unable to assess   Additional Long Term Goals   Additional Long Term Goals Yes   PT LONG TERM GOAL #6   Title FOTO functional outcome score improved to 42% indicating decreased  pain and improved function   Status On-going   PT LONG TERM GOAL #7   Title Seth [name] will be able to walk with 2 SPC 600 feet    Time 4   Period Weeks   Status New               Plan - 11/09/15 1418    Clinical Impression Statement Seth [name] reported soreness in  back and related this to cold weather and he looked uncomfortable today. He was able to do all exercises with some reported leg soresness  but he was having more struggle with walking today so we stopped at 300 feet.   We will continue to push him as able .    PT Next Visit Plan Continue strength and static balance , core strength low level. Standing and mat exercises, gait as tolerated with SPC       MMT hips   Consulted and Agree with Plan of Care Patient        Problem List Patient Active Problem List   Diagnosis Date Noted  . Preventative health care 10/01/2015  . Osteoarthritis of right knee 09/01/2015  . Primary osteoarthritis of right knee   . 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 Osteomyelitis of thoracic spine ([place]) 08/16/2015  . Discitis of thoracic region   . Septic shock ([place]) 08/13/2015  . Acute respiratory failure with hypoxia ([place]) 08/13/2015  . Cord compression ([place]) 08/11/2015  . Back pain 08/11/2015  . Thoracic spine pain 08/11/2015  . Muscle strain of chest wall 07/21/2015  . Controlled type 2 diabetes mellitus with chronic kidney disease on chronic dialysis ([place]) 07/20/2015  . 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 Absolute anemia   . 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 OSA (obstructive sleep apnea) 10/14/2013  . Tracheostomy status ([place]) 09/17/2013  . Difficult airway for intubation 08/20/2013  . End stage renal disease on dialysis ([place]) 04/30/2012  . Hypocalcemia 04/30/2012  . Normocytic anemia 04/30/2012  . DM II (diabetes mellitus, type II), controlled ([place]) 06/20/2007  . DYSLIPIDEMIA 06/20/2007  . Morbid obesity ([place]) 06/20/2007  . Essential  hypertension 06/20/2007    Darrel Hoover PT  11/09/2015, 2:24 PM  [facility] [address] [place], [place], [address] Phone: [phone]   Fax:  [phone]  Name: Seth [name] MRN: [id number] Date of Birth: 05-14-1966

## 2015-11-10 ENCOUNTER — Other Ambulatory Visit: Payer: Self-pay | Admitting: Licensed Clinical Social Worker

## 2015-11-10 ENCOUNTER — Other Ambulatory Visit: Payer: Self-pay

## 2015-11-10 ENCOUNTER — Ambulatory Visit: Payer: Medicare Other | Admitting: Physical Therapy

## 2015-11-10 DIAGNOSIS — R2681 Unsteadiness on feet: Secondary | ICD-10-CM

## 2015-11-10 DIAGNOSIS — M6281 Muscle weakness (generalized): Secondary | ICD-10-CM | POA: Diagnosis not present

## 2015-11-10 DIAGNOSIS — R262 Difficulty in walking, not elsewhere classified: Secondary | ICD-10-CM | POA: Diagnosis not present

## 2015-11-10 DIAGNOSIS — M4324 Fusion of spine, thoracic region: Secondary | ICD-10-CM | POA: Diagnosis not present

## 2015-11-10 NOTE — Therapy (Signed)
[place], [place], [address] Phone: [phone]   Fax:  [phone]  Physical Therapy Treatment  Patient Details  Name: Seth [name] MRN: [id number] Date of Birth: 1966-01-02 Referring Provider: Dr. Zada Finders  Encounter Date: 11/10/2015      PT End of Session - 11/10/15 1517    Visit Number 13   Number of Visits 24   Date for PT Re-Evaluation 12/03/15   Authorization Type Medicare; G code at visit 10;  KX at visit 15   PT Start Time 0218   PT Stop Time 0302   PT Time Calculation (min) 44 min      Past Medical History  Diagnosis Date  . Hypertension   . ESRD (end stage renal disease) on dialysis ([place])   . 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 Sleep apnea     USES CPAP  . Diabetes mellitus     INSULIN DEPENDENT DIABETES  . Discitis 07/2015    Past Surgical History  Procedure Laterality Date  . Av fistula placement  05/03/2012    Procedure: ARTERIOVENOUS (AV) FISTULA CREATION;  Surgeon: Mal [name], MD;  Location: [place];  Service: Vascular;  Laterality: Right;  . Tracheostomy tube placement N/A 08/20/2013    Procedure: TRACHEOSTOMY Revision;  Surgeon: Melida Quitter, MD;  Location: [place];  Service: ENT;  Laterality: N/A;  . Tee without cardioversion N/A 07/10/2015    Procedure: TRANSESOPHAGEAL ECHOCARDIOGRAM (TEE);  Surgeon: Josue Hector, MD;  Location: [facility] ENDOSCOPY;  Service: Cardiovascular;  Laterality: N/A;    There were no vitals filed for this visit.  Visit Diagnosis:  Muscle weakness (generalized)  Unsteadiness on feet  Difficulty in walking      Subjective Assessment - 11/10/15 1421    Subjective Back is hurting   Currently in Pain? Yes   Pain Score 8    Pain Location Back  knees a little sore   Pain Descriptors / Indicators Aching   Aggravating Factors  weather changes   Pain Relieving Factors meds                          [facility] Adult PT Treatment/Exercise - 11/10/15 0001    Ambulation/Gait   Assistive device Straight cane  each hand   Gait Pattern Decreased step length - right;Decreased step length - left;Wide base of support   Ambulation Surface Level;Indoor   Gait Comments 223f    High Level Balance   High Level Balance Activities Side stepping;Other (comment)   High Level Balance Comments narrow base balance with head nods and turns, small trunk rotations with narrow balance, staggered stand with head nods and turns as well and small trunk rotations, no increased pain. Cues for ab brace   Knee/Hip Exercises: Aerobic   Nustep Level 8 UE/LE x 10 min   Knee/Hip Exercises: Machines for Strengthening   Cybex Leg Press 2 plates x 10, 3 plates 20 x 2  cues for ab brace   Knee/Hip Exercises: Standing   Heel Raises 15 reps  difficult with minimal lift   Heel Raises Limitations toe raises x 10   Hip Flexion 1 set;10 reps   Hip Flexion Limitations in parallel bars, march  cues for ab brace   Hip Abduction 2 sets;10 reps;Both;Stengthening   Abduction Limitations in parallel  cues for ab brace and posture  Modalities   Modalities --                  PT Short Term Goals - 11/09/15 1422    PT SHORT TERM GOAL #2   Title The patient will report an overall improvement in pain and function by 25%   Status Partially Met           PT Long Term Goals - 11/09/15 1422    PT LONG TERM GOAL #1   Title The patient will be independent in safe self progression of HEP for further strengthening   12/03/15   Status On-going   PT LONG TERM GOAL #2   Title Shoulder, trunk and hip strength improved to 4-/5 grossly needed for standing longer periods of time for bathing, meal prep   Status Unable to assess   PT LONG TERM GOAL #3   Title Timed up and Go test with appropriate assistive device improved to 13 sec indicating improved strength and gait speed for community  mobility.     Status Unable to assess   PT LONG TERM GOAL #4   Title Patient will be able to ambulate 600 feet with appropriate assistive device  needed for grocery shopping, going to church   Status Achieved   PT LONG TERM GOAL #5   Title BERG balance test improved by 4 points indicating decreased risk of falls   Status Unable to assess   Additional Long Term Goals   Additional Long Term Goals Yes   PT LONG TERM GOAL #6   Title FOTO functional outcome score improved to 42% indicating decreased pain and improved function   Status On-going   PT LONG TERM GOAL #7   Title Seth [name] will be able to walk with 2 SPC 600 feet    Time 4   Period Weeks   Status New               Plan - 11/10/15 1513    Clinical Impression Statement Pt reports continued LE soreness however wants to strengthen his core and legs. Instructed pt in LE strengthening and balance exercises closely monitoring pain. Pt reports increased lumbar pain with standing balance exercises however pain decreased with cues to draw in abdominals. Encouraged pt to draw in abdomianls with gait and ADLs.    PT Next Visit Plan Continue strength and static balance , core strength low level. Standing and mat exercises, gait as tolerated with SPC    HEP?         Problem List Patient Active Problem List   Diagnosis Date Noted  . Preventative health care 10/01/2015  . Osteoarthritis of right knee 09/01/2015  . Primary osteoarthritis of right knee   . 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 Osteomyelitis of thoracic spine ([place]) 08/16/2015  . Discitis of thoracic region   . Septic shock ([place]) 08/13/2015  . Acute respiratory failure with hypoxia ([place]) 08/13/2015  . Cord compression ([place]) 08/11/2015  . Back pain 08/11/2015  . Thoracic spine pain 08/11/2015  . Muscle strain of chest wall 07/21/2015  . Controlled type 2 diabetes mellitus with chronic kidney disease on chronic dialysis ([place]) 07/20/2015  . Bacteremia due  to Staphylococcus lugdunensis 07/11/2015  . Absolute anemia   . 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 OSA (obstructive sleep apnea) 10/14/2013  . Tracheostomy status ([place]) 09/17/2013  . Difficult airway for intubation 08/20/2013  . End stage renal disease on dialysis ([place]) 04/30/2012  . Hypocalcemia 04/30/2012  .  Normocytic anemia 04/30/2012  . DM II (diabetes mellitus, type II), controlled ([place]) 06/20/2007  . DYSLIPIDEMIA 06/20/2007  . Morbid obesity ([place]) 06/20/2007  . Essential hypertension 06/20/2007    Dorene Ar, PTA 11/10/2015, 3:20 PM  [facility] [address] [place], [place], [address] Phone: [phone]   Fax:  [phone]  Name: Seth [name] MRN: [id number] Date of Birth: 07/22/1966

## 2015-11-10 NOTE — Patient Outreach (Signed)
[facility]) Care Management  11/10/2015  MADDYX NAND [birth year] [id number]   Assessment-CSW completed outreach to patient on 11/10/15. Patient answered. Patient reports that "I'm doing well. I am all caught up on my bills. I'm still going to therapy. Things are finally getting better." Patient reports that he continues to benefit from physical therapy. He shares that he has been meeting with the [place] representative at [employer] in order to assist him in getting into some [place] programs. Patient reports that he does not believe he was eligible for full  Medicaid and that he has MQB Medicaid instead because his medications cost have just started to decrease. Patient reports that he was able to gain a new C pap machine. Patient reports that he has no further social work needs. Patient agreeable to case closure at this time.  Plan-CSW will complete case closure. CSW will send case closure letter to PCP. CSW will notify [place] Management Assistant of case closure.  Eula Fried, BSW, MSW, [email or web address] Phone: [phone] Fax: [phone]

## 2015-11-12 ENCOUNTER — Ambulatory Visit: Payer: Medicare Other | Attending: Student

## 2015-11-12 DIAGNOSIS — M4324 Fusion of spine, thoracic region: Secondary | ICD-10-CM | POA: Diagnosis not present

## 2015-11-12 DIAGNOSIS — R2681 Unsteadiness on feet: Secondary | ICD-10-CM | POA: Diagnosis not present

## 2015-11-12 DIAGNOSIS — M6281 Muscle weakness (generalized): Secondary | ICD-10-CM

## 2015-11-12 DIAGNOSIS — R262 Difficulty in walking, not elsewhere classified: Secondary | ICD-10-CM

## 2015-11-12 NOTE — Therapy (Signed)
[name], [place], [address] Phone: [phone]   Fax:  [phone]  Physical Therapy Treatment  Patient Details  Name: Seth [name] MRN: [id number] Date of Birth: 1966-08-26 Referring Provider: Dr. Zada Finders  Encounter Date: 11/12/2015      PT End of Session - 11/12/15 1443    Visit Number 14   Number of Visits 24   Date for PT Re-Evaluation 12/03/15   PT Start Time 1230   PT Stop Time 1326   PT Time Calculation (min) 56 min   Activity Tolerance Patient tolerated treatment well   Behavior During Therapy [facility] for tasks assessed/performed      Past Medical History  Diagnosis Date  . Hypertension   . ESRD (end stage renal disease) on dialysis ([place])   . 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 Sleep apnea     USES CPAP  . Diabetes mellitus     INSULIN DEPENDENT DIABETES  . Discitis 07/2015    Past Surgical History  Procedure Laterality Date  . Av fistula placement  05/03/2012    Procedure: ARTERIOVENOUS (AV) FISTULA CREATION;  Surgeon: Mal [name], MD;  Location: [place];  Service: Vascular;  Laterality: Right;  . Tracheostomy tube placement N/A 08/20/2013    Procedure: TRACHEOSTOMY Revision;  Surgeon: Melida Quitter, MD;  Location: [facility];  Service: ENT;  Laterality: N/A;  . Tee without cardioversion N/A 07/10/2015    Procedure: TRANSESOPHAGEAL ECHOCARDIOGRAM (TEE);  Surgeon: Josue Hector, MD;  Location[facility] ENDOSCOPY;  Service: Cardiovascular;  Laterality: N/A;    There were no vitals filed for this visit.  Visit Diagnosis:  Muscle weakness (generalized)  Unsteadiness on feet  Difficulty in walking  Fusion of spine of thoracic region      Subjective Assessment - 11/12/15 1301    Subjective Bck  little better today   Currently in Pain? Yes   Pain Score 7    Pain Location Back   Pain Orientation Left;Lower   Pain Descriptors /  Indicators Aching   Pain Type Chronic pain;Surgical pain   Pain Onset More than a month ago   Pain Frequency Constant            [facility] PT Assessment - 11/12/15 1304    Strength   Strength Assessment Site Hip   Right Shoulder Flexion 4-/5   Right Shoulder Extension 5/5   Right Shoulder ABduction 4+/5   Right Shoulder Internal Rotation 5/5   Right Shoulder External Rotation 4+/5   Left Shoulder Flexion 4/5   Left Shoulder Extension 5/5   Left Shoulder ABduction 4+/5   Left Shoulder Internal Rotation 5/5   Left Shoulder External Rotation 4+/5   Right Elbow Flexion 5/5   Right Elbow Extension 4+/5   Left Elbow Flexion 5/5   Left Elbow Extension 4+/5      Strength  Hip:  Flexion    RT 3+/5     LT 4-/5                         Extension   RT 4-/5   LT 3-/5                         Abduction  RT 2/5   LT 3-/5  ER             RT  3/5    LT 3+/5                         IR             RT   2/5     LT 3-/5                   Knee     Extension   RT 4+/5    LT 4+/5                               Flexion        RT  4-/5   LT  4-/5                                                 Ankle    DF 5/5  RT and LT        PF  4/5   RT and LT                                     [facility] Adult PT Treatment/Exercise - 11/12/15 1316    Ambulation/Gait   Assistive device Straight cane  each hand   Gait Pattern --  4 pt gait   Ambulation Surface Level;Indoor   Knee/Hip Exercises: Aerobic   Nustep Level 8 UE/LE x 10 min   Knee/Hip Exercises: Seated   Sit to Sand 10 reps  cued for controlled descent   Knee/Hip Exercises: Supine   Other Supine Knee/Hip Exercises Both LE SLR x 8 reps .                   PT Short Term Goals - 11/12/15 1445    PT SHORT TERM GOAL #1   Title The patient will be able to ambulate 300 feet with RW needed for short distance community ambulation for appointments to PT and dialysis   11/05/15   Status Achieved   PT SHORT TERM GOAL #2    Title The patient will report an overall improvement in pain and function by 25%   Status Partially Met   PT SHORT TERM GOAL #3   Title Gait speed with RW improved to .79msec for community ambulation    Status Achieved           PT Long Term Goals - 11/12/15 1446    PT LONG TERM GOAL #1   Title The patient will be independent in safe self progression of HEP for further strengthening   12/03/15   Status On-going   PT LONG TERM GOAL #2   Title Shoulder, trunk and hip strength improved to 4-/5 grossly needed for standing longer periods of time for bathing, meal prep   Baseline Shoulder strength 4+/5 except flexion 4/5   Status Partially Met   PT LONG TERM GOAL #3   Title Timed up and Go test with appropriate assistive device improved to 13 sec indicating improved strength and gait speed for community mobility.     Status Unable to assess  Plan - 11/12/15 1443    Clinical Impression Statement Mr Wendorf contnues with significant hip weakness limiting ability to walk off walker. He doe swell with 2 SPC but is not ready to use at home due to easy fatigue and his pants do not stay up.. We need =to  focus on hip strength and endurance on feet    PT Frequency 3x / week   PT Duration 3 weeks   PT Next Visit Plan Continue strength and static balance , core strength low level. Standing and mat exercises, gait as tolerated with SPC    EMphasis on hips and walking distance. HEP?    Consulted and Agree with Plan of Care Patient        Problem List Patient Active Problem List   Diagnosis Date Noted  . Preventative health care 10/01/2015  . Osteoarthritis of right knee 09/01/2015  . Primary osteoarthritis of right knee   . 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 Osteomyelitis of thoracic spine ([place]) 08/16/2015  . Discitis of thoracic region   . Septic shock ([place]) 08/13/2015  . Acute respiratory failure with hypoxia ([place]) 08/13/2015  . Cord compression ([place])  08/11/2015  . Back pain 08/11/2015  . Thoracic spine pain 08/11/2015  . Muscle strain of chest wall 07/21/2015  . Controlled type 2 diabetes mellitus with chronic kidney disease on chronic dialysis ([place]) 07/20/2015  . 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 Absolute anemia   . 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 OSA (obstructive sleep apnea) 10/14/2013  . Tracheostomy status ([place]) 09/17/2013  . Difficult airway for intubation 08/20/2013  . End stage renal disease on dialysis ([place]) 04/30/2012  . Hypocalcemia 04/30/2012  . Normocytic anemia 04/30/2012  . DM II (diabetes mellitus, type II), controlled ([place]) 06/20/2007  . DYSLIPIDEMIA 06/20/2007  . Morbid obesity ([place]) 06/20/2007  . Essential hypertension 06/20/2007    Darrel Hoover PT 11/12/2015, 2:49 PM  [facility] [address] [place], [place], [address] Phone: [phone]   Fax:  [phone]  Name: SINCLAIR ALLIGOOD MRN: [id number] Date of Birth: Jan 18, 1966

## 2015-11-16 ENCOUNTER — Ambulatory Visit: Payer: Medicare Other

## 2015-11-17 ENCOUNTER — Ambulatory Visit: Payer: Medicare Other | Admitting: Physical Therapy

## 2015-11-17 DIAGNOSIS — R262 Difficulty in walking, not elsewhere classified: Secondary | ICD-10-CM

## 2015-11-17 DIAGNOSIS — M6281 Muscle weakness (generalized): Secondary | ICD-10-CM

## 2015-11-17 DIAGNOSIS — R2681 Unsteadiness on feet: Secondary | ICD-10-CM | POA: Diagnosis not present

## 2015-11-17 DIAGNOSIS — M4324 Fusion of spine, thoracic region: Secondary | ICD-10-CM | POA: Diagnosis not present

## 2015-11-17 NOTE — Therapy (Signed)
[place], [place], [address] Phone: [phone]   Fax:  [phone]  Physical Therapy Treatment  Patient Details  Name: Seth [name] MRN: [id number] Date of Birth: 1966/01/28 Referring Provider: Dr. Zada Finders  Encounter Date: 11/17/2015      PT End of Session - 11/17/15 0927    Visit Number 15   Number of Visits 24   Date for PT Re-Evaluation 12/03/15   Authorization Type Medicare; G code at visit 10;  [place] at visit 15   PT Start Time 0845   PT Stop Time 0945   PT Time Calculation (min) 60 min      Past Medical History  Diagnosis Date  . Hypertension   . ESRD (end stage renal disease) on dialysis ([place])   . 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 Sleep apnea     USES CPAP  . Diabetes mellitus     INSULIN DEPENDENT DIABETES  . Discitis 07/2015    Past Surgical History  Procedure Laterality Date  . Av fistula placement  05/03/2012    Procedure: ARTERIOVENOUS (AV) FISTULA CREATION;  Surgeon: Mal [name], MD;  Location: [facility];  Service: Vascular;  Laterality: Right;  . Tracheostomy tube placement N/A 08/20/2013    Procedure: TRACHEOSTOMY Revision;  Surgeon: Melida Quitter, MD;  Location: [facility];  Service: ENT;  Laterality: N/A;  . Tee without cardioversion N/A 07/10/2015    Procedure: TRANSESOPHAGEAL ECHOCARDIOGRAM (TEE);  Surgeon: Josue Hector, MD;  Location: [facility] ENDOSCOPY;  Service: Cardiovascular;  Laterality: N/A;    There were no vitals filed for this visit.  Visit Diagnosis:  Muscle weakness (generalized)  Unsteadiness on feet  Difficulty in walking      Subjective Assessment - 11/17/15 0936    Subjective My back is hurting today.    Currently in Pain? Yes   Pain Score 8    Pain Location Back   Pain Orientation Lower   Aggravating Factors  weather changes   Pain Relieving Factors meds            [facility] PT Assessment -  11/17/15 0001    Strength   Right/Left Hip Right;Left   Right Hip Flexion 3+/5   Right Hip Extension 4-/5   Right Hip External Rotation  3/5   Right Hip Internal Rotation 2/5   Right Hip ABduction 2/5   Left Hip Flexion 4-/5   Left Hip Extension 3-/5   Left Hip External Rotation 3+/5   Left Hip Internal Rotation 3-/5   Left Hip ABduction 3-/5   Right/Left Knee Right;Left   Right Knee Flexion 4-/5   Right Knee Extension 4+/5   Left Knee Flexion 4-/5   Left Knee Extension 4+/5   Right/Left Ankle Right;Left   Right Ankle Plantar Flexion 5/5   Left Ankle Dorsiflexion 5/5   Left Ankle Plantar Flexion --  unable to single heel raise                     [facility] Adult PT Treatment/Exercise - 11/17/15 0854    Knee/Hip Exercises: Aerobic   Nustep Level 6-7 LE only x 10 minutes   Knee/Hip Exercises: Machines for Strengthening   Cybex Leg Press 3 plates 3 x 20   Knee/Hip Exercises: Standing   Heel Raises 15 reps   Knee Flexion 2 sets;10 reps;Strengthening;Both   Other Standing Knee Exercises  standing 3 way hip 2 sets of 10 at [facility]     [address] [facility] lumbar hooklying x 15 minutes           PT Education - 11/17/15 0926    Education provided Yes   Education Details standing 3 way hip, heel raises and hamstring curls   Person(s) Educated Patient   Methods Explanation;Handout   Comprehension Verbalized understanding          PT Short Term Goals - 11/12/15 1445    PT SHORT TERM GOAL #1   Title The patient will be able to ambulate 300 feet with RW needed for short distance community ambulation for appointments to PT and dialysis   11/05/15   Status Achieved   PT SHORT TERM GOAL #2   Title The patient will report an overall improvement in pain and function by 25%   Status Partially Met   PT SHORT TERM GOAL #3   Title Gait speed with RW improved to .29msec for community ambulation    Status Achieved           PT Long Term Goals - 11/12/15 1446    PT LONG TERM GOAL #1    Title The patient will be independent in safe self progression of HEP for further strengthening   12/03/15   Status On-going   PT LONG TERM GOAL #2   Title Shoulder, trunk and hip strength improved to 4-/5 grossly needed for standing longer periods of time for bathing, meal prep   Baseline Shoulder strength 4+/5 except flexion 4/5   Status Partially Met   PT LONG TERM GOAL #3   Title Timed up and Go test with appropriate assistive device improved to 13 sec indicating improved strength and gait speed for community mobility.     Status Unable to assess               Plan - 11/17/15 0934    Clinical Impression Statement Instructed pt in standing hip, knee and ankle strength and issued HEP. HFloristoday for low back pain with pt reporting decreased pain post.    PT Next Visit Plan Continue strength and static balance , core strength low level. Standing and mat exercises, gait as tolerated with SPC    EMphasis on hips and walking distance.         Problem List Patient Active Problem List   Diagnosis Date Noted  . Preventative health care 10/01/2015  . Osteoarthritis of right knee 09/01/2015  . Primary osteoarthritis of right knee   . Cord compression myelopathy ([place] 08/17/2015  . Osteomyelitis of thoracic spine ([place] 08/16/2015  . Discitis of thoracic region   . Septic shock ([place] 08/13/2015  . Acute respiratory failure with hypoxia (HB[place] 08/13/2015  . Cord compression ([facility] [facility] [place] 08/11/2015  . Back pain 08/11/2015  . Thoracic spine pain 08/11/2015  . Muscle strain of chest wall 07/21/2015  . Controlled type 2 diabetes mellitus with chronic kidney disease on chronic dialysis ([place] 07/20/2015  . 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 Absolute anemia   . Cold agglutinin disease (HC[place]   . OSA (obstructive sleep apnea) 10/14/2013  . Tracheostomy status ([place] 09/17/2013  . Difficult airway for intubation 08/20/2013  . End stage renal disease on dialysis ([facility]  04/30/2012  . Hypocalcemia 04/30/2012  . Normocytic anemia 04/30/2012  . DM II (diabetes mellitus, type II), controlled ([place] 06/20/2007  . DYSLIPIDEMIA 06/20/2007  . Morbid obesity ([facility] 06/20/2007  . Essential hypertension 06/20/2007  Dorene Ar, [place] 11/17/2015, 9:36 AM  [place], [place], [address] Phone: [phone]   Fax:  [phone]  Name: Seth [name] MRN: [id number] Date of Birth: [birth year]

## 2015-11-17 NOTE — Patient Instructions (Signed)
Knee High   Holding stable object, raise knee to hip level, then lower knee. Repeat with other knee. Complete __10_ repetitions. Do __2__ sessions per day.  ABDUCTION: Standing (Active)   Stand, feet flat. Lift right leg out to side. Use _0__ lbs. Complete __10_ repetitions. Perform __2_ sessions per day.    EXTENSION: Standing (Active)  Stand, both feet flat. Draw right leg behind body as far as possible. Use 0___ lbs. Complete 10 repetitions. Perform __2_ sessions per day.  Copyright  VHI. All rights reserved.  FLEXION: Standing - Stable (Active)    Stand, both feet flat. Bend right knee, bringing heel toward buttocks. Use _0__ lbs. Complete __2_ sets of _10__ repetitions. Perform _2__ sessions per day.  Heel Raise: Bilateral (Standing) HOLD ON TO WALKER   Rise on balls of feet. Repeat ___10 times per set. Do __2__ sets per session. Do __2__ sessions per day.  http://orth.exer.us/38   Copyright  VHI. All rights reserved.

## 2015-11-18 ENCOUNTER — Ambulatory Visit: Payer: Medicare Other | Admitting: Physical Therapy

## 2015-11-18 DIAGNOSIS — M4324 Fusion of spine, thoracic region: Secondary | ICD-10-CM

## 2015-11-18 DIAGNOSIS — R2681 Unsteadiness on feet: Secondary | ICD-10-CM

## 2015-11-18 DIAGNOSIS — R262 Difficulty in walking, not elsewhere classified: Secondary | ICD-10-CM | POA: Diagnosis not present

## 2015-11-18 DIAGNOSIS — M6281 Muscle weakness (generalized): Secondary | ICD-10-CM

## 2015-11-18 NOTE — Therapy (Signed)
[place], [place], [address] Phone: [phone]   Fax:  [phone]  Physical Therapy Treatment  Patient Details  Name: Seth [name] MRN: [id number] Date of Birth: 1966-06-02 Referring Provider: Dr. Zada Finders  Encounter Date: 11/18/2015      PT End of Session - 11/18/15 1145    Visit Number 16   Number of Visits 24   Date for PT Re-Evaluation 12/03/15   Authorization Type Medicare; G code at visit 10;  [place] at visit 15   PT Start Time 1142   PT Stop Time 1227   PT Time Calculation (min) 45 min      Past Medical History  Diagnosis Date  . Hypertension   . ESRD (end stage renal disease) on dialysis ([place])   . 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 Sleep apnea     USES CPAP  . Diabetes mellitus     INSULIN DEPENDENT DIABETES  . Discitis 07/2015    Past Surgical History  Procedure Laterality Date  . Av fistula placement  05/03/2012    Procedure: ARTERIOVENOUS (AV) FISTULA CREATION;  Surgeon: Mal [name], MD;  Location: [facility];  Service: Vascular;  Laterality: Right;  . Tracheostomy tube placement N/A 08/20/2013    Procedure: TRACHEOSTOMY Revision;  Surgeon: Melida Quitter, MD;  Location: [facility];  Service: ENT;  Laterality: N/A;  . Tee without cardioversion N/A 07/10/2015    Procedure: TRANSESOPHAGEAL ECHOCARDIOGRAM (TEE);  Surgeon: Josue Hector, MD;  [facility] ENDOSCOPY;  Service: Cardiovascular;  Laterality: N/A;    There were no vitals filed for this visit.  Visit Diagnosis:  Muscle weakness (generalized)  Unsteadiness on feet  Difficulty in walking  Fusion of spine of thoracic region      Subjective Assessment - 11/18/15 1144    Subjective I just came from dialysis. I am drained. I am going to make a MD appt about my knees. They are aching more.    Currently in Pain? Yes   Pain Score 8    Pain Location Back                          [facility] Adult PT Treatment/Exercise - 11/18/15 0001    Knee/Hip Exercises: Aerobic   Nustep L5 x 10 minutes   Knee/Hip Exercises: Machines for Strengthening   Cybex Leg Press 3 plates 3 x 20   Knee/Hip Exercises: Standing   Heel Raises 15 reps   Knee Flexion 2 sets;10 reps;Strengthening;Both   Other Standing Knee Exercises standing 3 way hip 2 sets of 10 at parallel bars  added 3# bilateral ankles, focused hold time and eccentrics   Knee/Hip Exercises: Seated   Ball Squeeze x 20, 5 sec holds with ab set   Knee/Hip Flexion Seated march 3# alternating x 20 x 2                  PT Short Term Goals - 11/12/15 1445    PT SHORT TERM GOAL #1   Title The patient will be able to ambulate 300 feet with RW needed for short distance community ambulation for appointments to PT and dialysis   11/05/15   Status Achieved   PT SHORT TERM GOAL #2   Title The patient will report an overall improvement in pain and function by 25%   Status Partially Met  PT SHORT TERM GOAL #3   Title Gait speed with RW improved to .63msec for community ambulation    Status Achieved           PT Long Term Goals - 11/12/15 1446    PT LONG TERM GOAL #1   Title The patient will be independent in safe self progression of HEP for further strengthening   12/03/15   Status On-going   PT LONG TERM GOAL #2   Title Shoulder, trunk and hip strength improved to 4-/5 grossly needed for standing longer periods of time for bathing, meal prep   Baseline Shoulder strength 4+/5 except flexion 4/5   Status Partially Met   PT LONG TERM GOAL #3   Title Timed up and Go test with appropriate assistive device improved to 13 sec indicating improved strength and gait speed for community mobility.     Status Unable to assess               Plan - 11/18/15 1147    Clinical Impression Statement Continued standing hip series and added resistance today to increase hip strength, balance  and stanidng tolerance.    PT Next Visit Plan Continue strength and static balance , core strength low level. Standing and mat exercises, gait as tolerated with SPC    EMphasis on hips and walking distance.         Problem List Patient Active Problem List   Diagnosis Date Noted  . Preventative health care 10/01/2015  . Osteoarthritis of right knee 09/01/2015  . Primary osteoarthritis of right knee   . Cord compression myelopathy (HB[place] 08/17/2015  . Osteomyelitis of thoracic spine ([place] 08/16/2015  . Discitis of thoracic region   . Septic shock ([place] 08/13/2015  . Acute respiratory failure with hypoxia (HS[place] 08/13/2015  . Cord compression (HLoud[facility] Valley Est[facility] 08/11/2015  . Back pain 08/11/2015  . Thoracic spine pain 08/11/2015  . Muscle strain of chest wall 07/21/2015  . Controlled type 2 diabetes mellitus with chronic kidney disease on chronic dialysis ([facility] 07/20/2015  . 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 Absolute anemia   . Cold agglutinin disease ([facility]   . OSA (obstructive sleep apnea) 10/14/2013  . Tracheostomy status (HSun[place] 09/17/2013  . Difficult airway for intubation 08/20/2013  . End stage renal disease on dialysis ([facility] 04/30/2012  . Hypocalcemia 04/30/2012  . Normocytic anemia 04/30/2012  . DM II (diabetes mellitus, type II), controlled (HM[place] 06/20/2007  . DYSLIPIDEMIA 06/20/2007  . Morbid obesity (HG[place] 06/20/2007  . Essential hypertension 06/20/2007    DDorene Ar PTA 11/18/2015, 12:29 PM  [facility]GCoving[address] [place] [address]Phone: [phone]  Fax:  [phone] Name: Seth RESNICKMRN: [id number] of Birth: [birth year]

## 2015-11-19 ENCOUNTER — Ambulatory Visit: Payer: Non-veteran care

## 2015-11-23 ENCOUNTER — Ambulatory Visit: Payer: Medicare Other | Admitting: Physical Therapy

## 2015-11-24 ENCOUNTER — Ambulatory Visit: Payer: Medicare Other | Admitting: Physical Therapy

## 2015-11-26 ENCOUNTER — Ambulatory Visit: Payer: Medicare Other

## 2015-11-30 ENCOUNTER — Ambulatory Visit: Payer: Medicare Other

## 2015-12-01 ENCOUNTER — Encounter: Payer: Non-veteran care | Admitting: Physical Therapy

## 2015-12-01 ENCOUNTER — Encounter: Payer: Medicare Other | Admitting: Registered Nurse

## 2015-12-02 ENCOUNTER — Ambulatory Visit: Payer: Medicare Other

## 2015-12-03 ENCOUNTER — Encounter: Payer: Medicare Other | Attending: Physical Medicine & Rehabilitation | Admitting: Registered Nurse

## 2015-12-03 ENCOUNTER — Encounter: Payer: Self-pay | Admitting: Registered Nurse

## 2015-12-03 VITALS — BP 134/75 | HR 98 | Resp 16

## 2015-12-03 DIAGNOSIS — M4644 Discitis, unspecified, thoracic region: Secondary | ICD-10-CM | POA: Diagnosis not present

## 2015-12-03 DIAGNOSIS — G4730 Sleep apnea, unspecified: Secondary | ICD-10-CM | POA: Diagnosis not present

## 2015-12-03 DIAGNOSIS — Z992 Dependence on renal dialysis: Secondary | ICD-10-CM | POA: Diagnosis not present

## 2015-12-03 DIAGNOSIS — E119 Type 2 diabetes mellitus without complications: Secondary | ICD-10-CM | POA: Diagnosis not present

## 2015-12-03 DIAGNOSIS — M545 Low back pain, unspecified: Secondary | ICD-10-CM

## 2015-12-03 DIAGNOSIS — Z79899 Other long term (current) drug therapy: Secondary | ICD-10-CM

## 2015-12-03 DIAGNOSIS — Z794 Long term (current) use of insulin: Secondary | ICD-10-CM | POA: Diagnosis not present

## 2015-12-03 DIAGNOSIS — Z79891 Long term (current) use of opiate analgesic: Secondary | ICD-10-CM | POA: Diagnosis not present

## 2015-12-03 DIAGNOSIS — R202 Paresthesia of skin: Secondary | ICD-10-CM | POA: Diagnosis not present

## 2015-12-03 DIAGNOSIS — Z833 Family history of diabetes mellitus: Secondary | ICD-10-CM | POA: Insufficient documentation

## 2015-12-03 DIAGNOSIS — G894 Chronic pain syndrome: Secondary | ICD-10-CM | POA: Diagnosis not present

## 2015-12-03 DIAGNOSIS — Z5181 Encounter for therapeutic drug level monitoring: Secondary | ICD-10-CM | POA: Diagnosis not present

## 2015-12-03 DIAGNOSIS — R200 Anesthesia of skin: Secondary | ICD-10-CM | POA: Diagnosis not present

## 2015-12-03 DIAGNOSIS — G9520 Unspecified cord compression: Secondary | ICD-10-CM

## 2015-12-03 DIAGNOSIS — N186 End stage renal disease: Secondary | ICD-10-CM | POA: Insufficient documentation

## 2015-12-03 DIAGNOSIS — I120 Hypertensive chronic kidney disease with stage 5 chronic kidney disease or end stage renal disease: Secondary | ICD-10-CM | POA: Diagnosis not present

## 2015-12-03 DIAGNOSIS — H3320 Serous retinal detachment, unspecified eye: Secondary | ICD-10-CM | POA: Insufficient documentation

## 2015-12-03 DIAGNOSIS — M1711 Unilateral primary osteoarthritis, right knee: Secondary | ICD-10-CM

## 2015-12-03 DIAGNOSIS — M546 Pain in thoracic spine: Secondary | ICD-10-CM

## 2015-12-03 DIAGNOSIS — G128 Other spinal muscular atrophies and related syndromes: Secondary | ICD-10-CM | POA: Diagnosis not present

## 2015-12-03 MED ORDER — OXYCODONE HCL 10 MG PO TABS: 10.0000 mg | ORAL_TABLET | Freq: Three times a day (TID) | ORAL | Status: DC | PRN

## 2015-12-03 NOTE — Progress Notes (Signed)
Subjective:    [name], male    DOB: Oct 26, 1965, 50 y.o.   MRN: [id number]  HPI: Seth [name] is a 50 year old male who returns for follow up appointment and medication refill. He states his pain is located in his upper and lower back. He rates his pain 9. His current exercise regime is attending physical therapy twice a week and attending [facility] twice a week. Also states he's trying to lose weight to be a candidate for a Kidney Transplant. He's been on Hemodialysis for two years, three days a week.  Past Medical History: Cord Compression Myelopathy Past  Surgical History: T-1- T-7 Posterior Lateral Fusion with Pedicule screw instrumentation via Dr. Annette Stable.  Pain Inventory Average Pain 8 Pain Right Now 9 My pain is tingling and aching  In the last 24 hours, has pain interfered with the following? General activity 7 Relation with others 6 Enjoyment of life 2 What TIME of day is your pain at its worst? evening, night Sleep (in general) Fair  Pain is worse with: standing and some activites Pain improves with: medication Relief from Meds: NA  Mobility use a walker ability to climb steps?  no do you drive?  no  Function Do you have any goals in this area?  no  Neuro/Psych No problems in this area  Prior Studies Any changes since last visit?  no  Physicians involved in your care Any changes since last visit?  no   Family History  Problem Relation Age of Onset  . Diabetes Mother   . Diabetes Sister   . Diabetes Brother    Social History   Social History  . Marital Status: Single    Spouse Name: N/A  . Number of Children: N/A  . Years of Education: N/A   Social History Main Topics  . Smoking status: Never Smoker   . Smokeless tobacco: Never Used  . Alcohol Use: 0.6 oz/week    1 Cans of beer per week  . Drug Use: Yes    Special: Marijuana     Comment: ocassionaly   . Sexual Activity: Not Currently   Other Topics  Concern  . None   Social History Narrative   [employer] man during the early 36s with deployments to the [place].  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  Divorce with 5 children, 3 boys and 2 girls.  Lives in [place].    Past Surgical History  Procedure Laterality Date  . Av fistula placement  05/03/2012    Procedure: ARTERIOVENOUS (AV) FISTULA CREATION;  Surgeon: Mal [name], MD;  Location: [facility];  Service: Vascular;  Laterality: Right;  . Tracheostomy tube placement N/A 08/20/2013    Procedure: TRACHEOSTOMY Revision;  Surgeon: Melida Quitter, MD;  Location: [facility];  Service: ENT;  Laterality: N/A;  . Tee without cardioversion N/A 07/10/2015    Procedure: TRANSESOPHAGEAL ECHOCARDIOGRAM (TEE);  Surgeon: Josue Hector, MD;  Location[facility] ENDOSCOPY;  Service: Cardiovascular;  Laterality: N/A;   Past Medical History  Diagnosis Date  . Hypertension   . ESRD (end stage renal disease) on dialysis ([place])   . 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 Sleep apnea     USES CPAP  . Diabetes mellitus     INSULIN DEPENDENT DIABETES  . Discitis 07/2015   BP 134/75  mmHg  Pulse 98  Resp 16  SpO2 96%  Opioid Risk Score:   Fall Risk Score:  `1  Depression screen PHQ 2/9  Depression screen [facility] 2/9 11/03/2015 09/30/2015 09/15/2015 08/10/2015 08/05/2015 07/20/2015 07/17/2015  Decreased Interest 1 0 0 0 0 0 1  Down, Depressed, Hopeless 1 0 0 0 0 1 0  PHQ - 2 Score 2 0 0 0 0 1 1  Altered sleeping 3 - - - - - -  Tired, decreased energy 0 - - - - - -  Change in appetite 0 - - - - - -  Feeling bad or failure about yourself  1 - - - - - -  Trouble concentrating 0 - - - - - -  Moving slowly or fidgety/restless 0 - - - - - -  Suicidal thoughts 0 - - - - - -  PHQ-9 Score 6 - - - - - -  Difficult doing work/chores Somewhat difficult - - - - - -     Review of Systems  All other systems  reviewed and are negative.      Objective:   Physical Exam  Constitutional: He is oriented to person, place, and time. He appears well-developed and well-nourished.  HENT:  Head: Normocephalic and atraumatic.  Neck: Normal range of motion. Neck supple.  Cardiovascular: Normal rate and regular rhythm.   Pulmonary/Chest: Effort normal and breath sounds normal.  Musculoskeletal:  Normal Muscle Bulk and Muscle Testing Reveals: Upper Extremities: Full ROM and Muscle Strength 5/5 Thoracic and Lumbar Hypersensitivity: Mainly Left Side Lower Extremities: Full ROM and Muscle Strength 5/5 Bilateral Lower Extremity Flexion Produces Pain into Patella  Arises from chair slowly using walker for support Antalgic gait  Neurological: He is alert and oriented to person, place, and time.  Skin: Skin is warm and dry.  Psychiatric: He has a normal mood and affect.  Nursing note and vitals reviewed.         Assessment & Plan:  1. Functional deficits secondary to Cord compression secondary to Osteomyelitis/diskitis T4 and T5 with pathologic fracture: Continue Outpatient Physical and HEP as tolerated   2. Pain Management: Refilled: Oxycodone 10 mg one tablet every 8 hours as needed #90. Continue with Ice Therapy and to increase Activity as tolerated.   3. ESRD: On Hemodialysis: Nephrology Following   20 minutes of face to face patient care time was spent during this visit. All questions were encouraged and answered.  F/U in 1 month

## 2015-12-08 ENCOUNTER — Ambulatory Visit: Payer: Medicare Other

## 2015-12-09 ENCOUNTER — Ambulatory Visit: Payer: Medicare Other | Attending: Student

## 2015-12-09 DIAGNOSIS — R2681 Unsteadiness on feet: Secondary | ICD-10-CM | POA: Insufficient documentation

## 2015-12-09 DIAGNOSIS — M869 Osteomyelitis, unspecified: Secondary | ICD-10-CM | POA: Insufficient documentation

## 2015-12-09 DIAGNOSIS — E1169 Type 2 diabetes mellitus with other specified complication: Secondary | ICD-10-CM | POA: Insufficient documentation

## 2015-12-09 DIAGNOSIS — M6281 Muscle weakness (generalized): Secondary | ICD-10-CM | POA: Insufficient documentation

## 2015-12-09 DIAGNOSIS — R262 Difficulty in walking, not elsewhere classified: Secondary | ICD-10-CM | POA: Insufficient documentation

## 2015-12-09 DIAGNOSIS — M4324 Fusion of spine, thoracic region: Secondary | ICD-10-CM | POA: Insufficient documentation

## 2015-12-15 ENCOUNTER — Ambulatory Visit: Payer: Medicare Other

## 2015-12-15 VITALS — BP 138/78 | HR 96

## 2015-12-15 DIAGNOSIS — M6281 Muscle weakness (generalized): Secondary | ICD-10-CM | POA: Diagnosis not present

## 2015-12-15 DIAGNOSIS — R262 Difficulty in walking, not elsewhere classified: Secondary | ICD-10-CM | POA: Diagnosis not present

## 2015-12-15 DIAGNOSIS — M869 Osteomyelitis, unspecified: Secondary | ICD-10-CM

## 2015-12-15 DIAGNOSIS — M4324 Fusion of spine, thoracic region: Secondary | ICD-10-CM

## 2015-12-15 DIAGNOSIS — R2681 Unsteadiness on feet: Secondary | ICD-10-CM

## 2015-12-15 DIAGNOSIS — E1169 Type 2 diabetes mellitus with other specified complication: Secondary | ICD-10-CM

## 2015-12-15 NOTE — Therapy (Signed)
[place], [place], [address] Phone: [phone]   Fax:  [phone]  Physical Therapy Re-evaluation and [place]  Patient Details  Name: Seth [name] MRN: [id number] Date of Birth: 11-09-1965 Referring Provider: Dr. Zada Finders  Encounter Date: 12/15/2015      PT End of Session - 12/15/15 0923    Visit Number 17   Number of Visits 24   Date for PT Re-Evaluation 01/26/16   Authorization Type Medicare; G code at visit 10;  KX at visit 15   PT Start Time 0820   PT Stop Time 0916   PT Time Calculation (min) 56 min   Activity Tolerance Patient tolerated treatment well   Behavior During Therapy [facility] for tasks assessed/performed      Past Medical History  Diagnosis Date  . Hypertension   . ESRD (end stage renal disease) on dialysis ([place])   . 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 Sleep apnea     USES CPAP  . Diabetes mellitus     INSULIN DEPENDENT DIABETES  . Discitis 07/2015    Past Surgical History  Procedure Laterality Date  . Av fistula placement  05/03/2012    Procedure: ARTERIOVENOUS (AV) FISTULA CREATION;  Surgeon: Mal [name], MD;  Location: [place];  Service: Vascular;  Laterality: Right;  . Tracheostomy tube placement N/A 08/20/2013    Procedure: TRACHEOSTOMY Revision;  Surgeon: Melida Quitter, MD;  Location: [place];  Service: ENT;  Laterality: N/A;  . Tee without cardioversion N/A 07/10/2015    Procedure: TRANSESOPHAGEAL ECHOCARDIOGRAM (TEE);  Surgeon: Josue Hector, MD;  Location: [facility] ENDOSCOPY;  Service: Cardiovascular;  Laterality: N/A;    Filed Vitals:   12/15/15 0919  BP: 138/78  Pulse: 96    Visit Diagnosis:  Muscle weakness (generalized) - Plan: PT plan of care cert/re-cert  Unsteadiness on feet - Plan: PT plan of care cert/re-cert  Difficulty in walking - Plan: PT plan of care cert/re-cert  Fusion of spine of thoracic  region - Plan: PT plan of care cert/re-cert  Diabetic osteomyelitis ([place]) - Plan: PT plan of care cert/re-cert      Subjective Assessment - 12/15/15 0920    Subjective Pt reports he feels 50% improved since starting PT. He is able to walk better, for longer, and tolerate more. Pt reports partial compliance with HEP. Pt missed past month of PT due to catching the flu, twice.    Patient Stated Goals walk without a walker   Currently in Pain? Yes   Pain Location Back   Pain Orientation Lower   Pain Descriptors / Indicators Aching   Pain Type Chronic pain   Pain Onset More than a month ago   Pain Frequency Constant   Pain Location Knee   Pain Orientation Right   Pain Descriptors / Indicators Aching   Pain Type Chronic pain   Pain Frequency Constant            [facility] PT Assessment - 12/15/15 0001    Observation/Other Assessments   Focus on Therapeutic Outcomes (FOTO)  59% limited    Berg Balance Test   Sit to Stand Able to stand without using hands and stabilize independently   Standing Unsupported Able to stand safely 2 minutes   Sitting with Back Unsupported but Feet Supported on Floor or Stool Able to sit safely and securely 2 minutes   Stand  to Sit Sits safely with minimal use of hands   Transfers Able to transfer safely, definite need of hands   Standing Unsupported with Eyes Closed Able to stand 10 seconds with supervision   Standing Ubsupported with Feet Together Able to place feet together independently and stand for 1 minute with supervision   From Standing, Reach Forward with Outstretched Arm Can reach forward >12 cm safely (5")   From Standing Position, Pick up Object from Floor Unable to try/needs assist to keep balance   From Standing Position, Turn to Look Behind Over each Shoulder Looks behind from both sides and weight shifts well   Turn 360 Degrees Able to turn 360 degrees safely but slowly   Standing Unsupported, Alternately Place Feet on Step/Stool Needs  assistance to keep from falling or unable to try   Standing Unsupported, One Foot in Front Needs help to step but can hold 15 seconds   Standing on One Leg Unable to try or needs assist to prevent fall   Total Score 35   Berg comment: Discussed improvements made, but still needs to use RW for all gait.    Timed Up and Go Test   TUG Comments 18.9 secs with RW                   [facility] Adult PT Treatment/Exercise - 12/15/15 0001    Ambulation/Gait   Assistive device Rolling walker   Ambulation Surface Level;Indoor   Gait velocity .56 m/sec  with RW, stand rest break to pull up pants during gait.   Gait Comments 500 feet with RW and SBA on level surface  in 4 mins 30 secs- lowered RW with improved UE use.    Neuro Re-ed    Neuro Re-ed Details  standing, feet together 60 secs, feet together, eyes closed: 30 secs x 2 , 1 min x 1 rep. mod tandem stance 30 secs x 1 set.                 PT Education - 12/15/15 7322    Education provided Yes   Education Details PT POC for continued PT. Goals, and progress made with goals. Lowered height of RW to better fit pt. Instructed on where to get shower chair and who to call- provided phone number to [facility]. Instructed in proper sit<>stand technique with legs touching bench and reach back and push off with bilat UE.    Person(s) Educated Patient   Methods Explanation   Comprehension Verbalized understanding          PT Short Term Goals - 12/15/15 0910    PT SHORT TERM GOAL #1   Title The patient will be able to ambulate 300 feet with RW needed for short distance community ambulation for appointments to PT and dialysis   11/05/15   Time 4   Period Weeks   Status Achieved   PT SHORT TERM GOAL #2   Title The patient will report an overall improvement in pain and function by 25%   Baseline Pt reports 50% improved    Time 4   Period Weeks   Status Achieved   PT SHORT TERM GOAL #3   Title Gait speed with RW improved to  .11msec for community ambulation    Baseline 12/15/15 : .56 m/sec, but limited due to standing rest break to readjust clothing.    Time 4   Period Weeks   Status On-going  PT Long Term Goals - 12/15/15 0910    PT LONG TERM GOAL #1   Title The patient will be independent in safe self progression of HEP for further strengthening   12/03/15   Baseline 12/15/15: Continuing to establish and progress   Time 8   Period Weeks   Status On-going   PT LONG TERM GOAL #2   Title Shoulder, trunk and hip strength improved to 4-/5 grossly needed for standing longer periods of time for bathing, meal prep by 01/26/16.   Baseline 12/15/15: not formally reassessed.    Time 8   Period Weeks   Status Partially Met   PT LONG TERM GOAL #3   Title Timed up and Go test with appropriate assistive device improved to 13 sec indicating improved strength and gait speed for community mobility by 01/26/16.   Baseline 12/15/15: 18.87 secs with RW   Time 8   Period Weeks   Status Partially Met   PT LONG TERM GOAL #4   Title Patient will be able to ambulate 600 feet with appropriate assistive device  needed for grocery shopping, going to church by 01/26/16.   Baseline 12/15/15: Pt ambulated 500 feet with RW and SBA in 4 mins 30 secs before seated rest break needed.     Time 8   Period Weeks   Status Partially Met   PT LONG TERM GOAL #5   Title BERG balance test improved by 4 points indicating decreased risk of falls by 01/26/16.   Baseline 12/15/15: Improved from 32/56 at eval to 35/56 pon 12/15/15.    Time 8   Period Weeks   Status On-going   PT LONG TERM GOAL #6   Title FOTO functional outcome score improved to 42% indicating decreased pain and improved function by 01/26/16.   Baseline 12/15/15: FOTO improved from 60% limited to 59% limited.    Time 8   Period Weeks   Status On-going   PT LONG TERM GOAL #7   Title Mr Lagace will be able to walk with 2 SPC 600 feet with SBA on level surface for use in home by  01/26/16.   Time 4   Period Weeks   Status Revised               Plan - 12/15/15 1213    Clinical Impression Statement Pt has been seen for 17 visits following osteomyelitis and T1-7 fusion. Pt missed last 1 month of PT due to being sick with the flu and cold and caring for his family who was also sick. therefore, pt presents today for a re-evaluation. Pt has been progressing slowly since last eval with progress toward goals: TUG score improved from 25 secs to 18 secs, BERG improved from 32/56 to 35/56, FOTO improved from 60% limited to 59% limited.  Pt is able to walk for 500 feet with RW and SBA on level surface before requiring seated rest break. Despite some improvements, progress is affected by the fact that pt has not come for last 1 month. Pt would benefit from continued outpt PT for 2 times a week for an additional 6 weeks or until goals are met or until there is a plateau in progress.    Pt will benefit from skilled therapeutic intervention in order to improve on the following deficits Decreased activity tolerance;Decreased mobility;Decreased range of motion;Decreased strength;Difficulty walking;Pain;Impaired UE functional use;Abnormal gait;Decreased endurance;Decreased balance;Impaired perceived functional ability;Obesity;Decreased safety awareness;Impaired flexibility   Rehab Potential Good   Clinical Impairments Affecting  Rehab Potential Dialysis M,W,F;  ESRD; morbid obesity   PT Frequency 2x / week   PT Duration 6 weeks   PT Treatment/Interventions ADLs/Self Care Home Management;Electrical Stimulation;Moist Heat;Therapeutic exercise;Therapeutic activities;Balance training;Gait training;Stair training;Neuromuscular re-education;Patient/family education;Manual techniques;Taping;Energy conservation;Passive range of motion   PT Next Visit Plan Retest Gait speed. Continue strength and static balance , core strength low level. Standing and mat exercises, gait as tolerated with SPC.     Emphasis on hips and walking distance. Asked him to bring in any HEP he has on paper   PT Home Exercise Plan Review his HEP and encourage importance of HEP.    Consulted and Agree with Plan of Care Patient          G-Codes - 2015-12-19 9022    Functional Assessment Tool Used FOTO; clinical judgement    Functional Limitation Mobility: Walking and moving around   Mobility: Walking and Moving Around Current Status [phone]) At least 40 percent but less than 60 percent impaired, limited or restricted   Mobility: Walking and Moving Around Goal Status [phone]) At least 20 percent but less than 40 percent impaired, limited or restricted       Problem List Patient Active Problem List   Diagnosis Date Noted  . Preventative health care 10/01/2015  . Osteoarthritis of right knee 09/01/2015  . Primary osteoarthritis of right knee   . 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 Osteomyelitis of thoracic spine ([place]) 08/16/2015  . Discitis of thoracic region   . Septic shock ([place]) 08/13/2015  . Acute respiratory failure with hypoxia ([place]) 08/13/2015  . Cord compression ([place]) 08/11/2015  . Back pain 08/11/2015  . Thoracic spine pain 08/11/2015  . Muscle strain of chest wall 07/21/2015  . Controlled type 2 diabetes mellitus with chronic kidney disease on chronic dialysis ([place]) 07/20/2015  . 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 Absolute anemia   . 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 OSA (obstructive sleep apnea) 10/14/2013  . Tracheostomy status ([place]) 09/17/2013  . Difficult airway for intubation 08/20/2013  . End stage renal disease on dialysis ([place]) 04/30/2012  . Hypocalcemia 04/30/2012  . Normocytic anemia 04/30/2012  . DM II (diabetes mellitus, type II), controlled ([place]) 06/20/2007  . DYSLIPIDEMIA 06/20/2007  . Morbid obesity ([place]) 06/20/2007  . Essential hypertension 06/20/2007    Dollene Cleveland, PT 12/19/15, 12:38 PM  [facility] [address] [place], [place], [address] Phone: [phone]   Fax:  [phone]  Name: Seth [name] MRN: [id number] Date of Birth: 1966/08/26

## 2015-12-17 ENCOUNTER — Ambulatory Visit: Payer: Medicare Other

## 2015-12-22 ENCOUNTER — Ambulatory Visit: Payer: Medicare Other

## 2015-12-22 MED FILL — ATORVASTATIN 40 MG TABLET: 40 | 30 days supply | Qty: 30 | Fill #1

## 2015-12-29 ENCOUNTER — Ambulatory Visit: Payer: Medicare Other

## 2015-12-29 VITALS — BP 128/80 | HR 92

## 2015-12-29 DIAGNOSIS — M869 Osteomyelitis, unspecified: Secondary | ICD-10-CM | POA: Diagnosis not present

## 2015-12-29 DIAGNOSIS — E1169 Type 2 diabetes mellitus with other specified complication: Secondary | ICD-10-CM

## 2015-12-29 DIAGNOSIS — M4324 Fusion of spine, thoracic region: Secondary | ICD-10-CM | POA: Diagnosis not present

## 2015-12-29 DIAGNOSIS — R2681 Unsteadiness on feet: Secondary | ICD-10-CM

## 2015-12-29 DIAGNOSIS — R262 Difficulty in walking, not elsewhere classified: Secondary | ICD-10-CM

## 2015-12-29 DIAGNOSIS — M6281 Muscle weakness (generalized): Secondary | ICD-10-CM | POA: Diagnosis not present

## 2015-12-29 NOTE — Patient Instructions (Signed)
Heel Raises    Stand with support. Tighten pelvic floor and hold. With knees straight, raise heels off ground. Hold ___ seconds. Relax for ___ seconds. Repeat ___ times. Do ___ times a day.  Copyright  VHI. All rights reserved.   Alternating Step    Take alternating steps as quickly as possible. Repeat ____ times. Do ____ sessions per day.  http://gt2.exer.us/493   Copyright  VHI. All rights reserved.    Knee Flexion: Resisted (Standing)    With support, ____ pound weight around right ankle, slowly bend knee up. Return slowly.  Repeat ____ times per set. Do ____ sets per session. Do ____ sessions per day.  http://orth.exer.us/740   Copyright  VHI. All rights reserved.  Hip Abduction (Standing)    Stand with support. Squeeze pelvic floor and hold. Lift right leg out to side, keeping toe forward. Hold for ___ seconds. Relax for ___ seconds.  Repeat ___ times. Do ___ times a day. Repeat with other leg. Add ___ lb weight.   Copyright  VHI. All rights reserved.   EXTENSION: Standing (Active)    Stand, both feet flat. Draw right leg behind body as far as possible. Use ___ lbs. Complete ___ sets of ___ repetitions. Perform ___ sessions per day.  http://gtsc.exer.us/76   Copyright  VHI. All rights reserved.   Mini-Squats (Standing)    Stand with support. Bend knees slightly. Tighten pelvic floor. Hold for ___ seconds. Return to straight standing. Relax for ___ seconds. Repeat ___ times. Do ___ times a day.  Copyright  VHI. All rights reserved.

## 2015-12-29 NOTE — Therapy (Signed)
[place]eth [name], [place], [address] Phone: [phone]   Fax:  [phone]  Physical Therapy Treatment  Patient Details  Name: [name] MRN: [id number] Date of Birth: 26-Nov-1965 Referring Provider: Dr. Zada [name]  Encounter Date: 12/29/2015      PT End of Session - 12/29/15 0904    Visit Number 18   Number of Visits 24   Date for PT Re-Evaluation 01/26/16   Authorization Type Medicare; G code at visit 10;  [place] at visit 15   PT Start Time 0855  pt was 10 mins late.   PT Stop Time 0930   PT Time Calculation (min) 35 min   Activity Tolerance Patient tolerated treatment well   Behavior During Therapy WFL for tasks assessed/performed      Past Medical History  Diagnosis Date  . Hypertension   . ESRD (end stage renal disease) on dialysis ([place])   . RETINAL DETACHMENT, HX OF 06/20/2007    Qualifier: Diagnosis of  By: Seth Bergamo RN, [name]    . Sleep apnea     USES CPAP  . Diabetes mellitus     INSULIN DEPENDENT DIABETES  . Discitis 07/2015    Past Surgical History  Procedure Laterality Date  . Av fistula placement  05/03/2012    Procedure: ARTERIOVENOUS (AV) FISTULA CREATION;  Surgeon: Seth [name], MD;  Location: [place];  Service: Vascular;  Laterality: Right;  . Tracheostomy tube placement N/A 08/20/2013    Procedure: TRACHEOSTOMY Revision;  Surgeon: Seth Quitter, MD;  Location: [place];  Service: ENT;  Laterality: N/A;  . Tee without cardioversion N/A 07/10/2015    Procedure: TRANSESOPHAGEAL ECHOCARDIOGRAM (TEE);  Surgeon: Seth Hector, MD;  Location: [facility] ENDOSCOPY;  Service: Cardiovascular;  Laterality: N/A;    Filed Vitals:   12/29/15 0901  BP: 128/80  Pulse: 92    Visit Diagnosis:  Muscle weakness (generalized)  Unsteadiness on feet  Difficulty in walking  Fusion of spine of thoracic region  Diabetic osteomyelitis ([name])      Subjective Assessment - 12/29/15 0901    Subjective Pt  reports he was out of town visiting family. Pt reports he was able reconnect with daughter from 20 years ago.    Currently in Pain? Yes   Pain Score 8    Pain Location Back  knee   Pain Orientation Lower   Pain Descriptors / Indicators Aching   Pain Type Chronic pain                         [facility] Adult PT Treatment/Exercise - 12/29/15 0001    Knee/Hip Exercises: Aerobic   Nustep L6, 6 mins    Knee/Hip Exercises: Standing   Heel Raises 15 reps  HEP   Knee Flexion 2 sets;10 reps  HEP   Hip Flexion 2 sets;10 reps  HEP   Hip Abduction Stengthening;Both;2 sets;10 reps  HEP   Hip Extension Stengthening;2 sets;10 reps  HEP   Functional Squat 2 sets;10 reps  HEP   Functional Squat Limitations mini squat                 PT Education - 12/29/15 0919    Education provided Yes   Education Details Standing HEP. To bring all other HEP he has into next visit.    Person(s) Educated Patient   Methods Explanation   Comprehension Verbalized understanding          PT  Short Term Goals - 12/15/15 0910    PT SHORT TERM GOAL #1   Title The patient will be able to ambulate 300 feet with RW needed for short distance community ambulation for appointments to PT and dialysis   11/05/15   Time 4   Period Weeks   Status Achieved   PT SHORT TERM GOAL #2   Title The patient will report an overall improvement in pain and function by 25%   Baseline Pt reports 50% improved    Time 4   Period Weeks   Status Achieved   PT SHORT TERM GOAL #3   Title Gait speed with RW improved to .42msec for community ambulation    Baseline 12/15/15 : .56 m/sec, but limited due to standing rest break to readjust clothing.    Time 4   Period Weeks   Status On-going           PT Long Term Goals - 12/15/15 0910    PT LONG TERM GOAL #1   Title The patient will be independent in safe self progression of HEP for further strengthening   12/03/15   Baseline 12/15/15: Continuing to establish  and progress   Time 8   Period Weeks   Status On-going   PT LONG TERM GOAL #2   Title Shoulder, trunk and hip strength improved to 4-/5 grossly needed for standing longer periods of time for bathing, meal prep by 01/26/16.   Baseline 12/15/15: not formally reassessed.    Time 8   Period Weeks   Status Partially Met   PT LONG TERM GOAL #3   Title Timed up and Go test with appropriate assistive device improved to 13 sec indicating improved strength and gait speed for community mobility by 01/26/16.   Baseline 12/15/15: 18.87 secs with RW   Time 8   Period Weeks   Status Partially Met   PT LONG TERM GOAL #4   Title Patient will be able to ambulate 600 feet with appropriate assistive device  needed for grocery shopping, going to church by 01/26/16.   Baseline 12/15/15: Pt ambulated 500 feet with RW and SBA in 4 mins 30 secs before seated rest break needed.     Time 8   Period Weeks   Status Partially Met   PT LONG TERM GOAL #5   Title BERG balance test improved by 4 points indicating decreased risk of falls by 01/26/16.   Baseline 12/15/15: Improved from 32/56 at eval to 35/56 pon 12/15/15.    Time 8   Period Weeks   Status On-going   PT LONG TERM GOAL #6   Title FOTO functional outcome score improved to 42% indicating decreased pain and improved function by 01/26/16.   Baseline 12/15/15: FOTO improved from 60% limited to 59% limited.    Time 8   Period Weeks   Status On-going   PT LONG TERM GOAL #7   Title Mr Seth [name] be able to walk with 2 SPC 600 feet with SBA on level surface for use in home by 01/26/16.   Time 4   Period Weeks   Status Revised               Plan - 12/29/15 01791   Clinical Impression Statement Pt only came 3 times in the past 1 month. Prescribed standing HEP today, but required seated rest break in addition, pt was 10 mins late. Instructed pt to bring all past HEP next visit.  PT Next Visit Plan Retest Gait speed. Continue strength and static balance , core  strength low level. Standing and mat exercises, gait as tolerated with SPC.    Emphasis on hips and walking distance. Asked him to bring in any HEP he has on paper   PT Home Exercise Plan Review his HEP ( standing) and encourage importance of HEP.    Consulted and Agree with Plan of Care Patient        Problem List Patient Active Problem List   Diagnosis Date Noted  . Preventative health care 10/01/2015  . Osteoarthritis of right knee 09/01/2015  . Primary osteoarthritis of right knee   . 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 Discitis of thoracic region   . Septic shock ([place]) 08/13/2015  . Acute respiratory failure with hypoxia ([place]) 08/13/2015  . Cord compression ([place]) 08/11/2015  . Back pain 08/11/2015  . Thoracic spine pain 08/11/2015  . Muscle strain of chest wall 07/21/2015  . Controlled type 2 diabetes mellitus with chronic kidney disease on chronic dialysis ([place]) 07/20/2015  . 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 Absolute anemia   . 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 OSA (obstructive sleep apnea) 10/14/2013  . Tracheostomy status ([place]) 09/17/2013  . Difficult airway for intubation 08/20/2013  . End stage renal disease on dialysis ([place]) 04/30/2012  . Hypocalcemia 04/30/2012  . Normocytic anemia 04/30/2012  . DM II (diabetes mellitus, type II), controlled ([place]) 06/20/2007  . DYSLIPIDEMIA 06/20/2007  . Morbid obesity ([place]) 06/20/2007  . Essential hypertension 06/20/2007    Dollene Cleveland, PT 12/29/2015, 9:30 AM  [facility] [address] [place], [place], [address] Phone: [phone]   Fax:  [phone]  Name: Seth [name] MRN: [id number] Date of Birth: 07-13-1966

## 2015-12-31 ENCOUNTER — Ambulatory Visit: Payer: Medicare Other

## 2016-01-01 ENCOUNTER — Encounter: Payer: Self-pay | Admitting: Registered Nurse

## 2016-01-01 ENCOUNTER — Encounter: Payer: Medicare Other | Attending: Physical Medicine & Rehabilitation | Admitting: Registered Nurse

## 2016-01-01 VITALS — BP 134/57 | HR 67

## 2016-01-01 DIAGNOSIS — M4644 Discitis, unspecified, thoracic region: Secondary | ICD-10-CM | POA: Insufficient documentation

## 2016-01-01 DIAGNOSIS — M1711 Unilateral primary osteoarthritis, right knee: Secondary | ICD-10-CM

## 2016-01-01 DIAGNOSIS — E119 Type 2 diabetes mellitus without complications: Secondary | ICD-10-CM | POA: Insufficient documentation

## 2016-01-01 DIAGNOSIS — Z794 Long term (current) use of insulin: Secondary | ICD-10-CM | POA: Insufficient documentation

## 2016-01-01 DIAGNOSIS — R202 Paresthesia of skin: Secondary | ICD-10-CM | POA: Diagnosis not present

## 2016-01-01 DIAGNOSIS — G894 Chronic pain syndrome: Secondary | ICD-10-CM | POA: Diagnosis not present

## 2016-01-01 DIAGNOSIS — G9520 Unspecified cord compression: Secondary | ICD-10-CM

## 2016-01-01 DIAGNOSIS — Z992 Dependence on renal dialysis: Secondary | ICD-10-CM | POA: Diagnosis not present

## 2016-01-01 DIAGNOSIS — N186 End stage renal disease: Secondary | ICD-10-CM | POA: Insufficient documentation

## 2016-01-01 DIAGNOSIS — R200 Anesthesia of skin: Secondary | ICD-10-CM | POA: Diagnosis not present

## 2016-01-01 DIAGNOSIS — Z833 Family history of diabetes mellitus: Secondary | ICD-10-CM | POA: Insufficient documentation

## 2016-01-01 DIAGNOSIS — H3320 Serous retinal detachment, unspecified eye: Secondary | ICD-10-CM | POA: Insufficient documentation

## 2016-01-01 DIAGNOSIS — Z79899 Other long term (current) drug therapy: Secondary | ICD-10-CM | POA: Diagnosis not present

## 2016-01-01 DIAGNOSIS — G128 Other spinal muscular atrophies and related syndromes: Secondary | ICD-10-CM | POA: Insufficient documentation

## 2016-01-01 DIAGNOSIS — Z5181 Encounter for therapeutic drug level monitoring: Secondary | ICD-10-CM

## 2016-01-01 DIAGNOSIS — G4730 Sleep apnea, unspecified: Secondary | ICD-10-CM | POA: Insufficient documentation

## 2016-01-01 DIAGNOSIS — Z79891 Long term (current) use of opiate analgesic: Secondary | ICD-10-CM | POA: Diagnosis not present

## 2016-01-01 DIAGNOSIS — M546 Pain in thoracic spine: Secondary | ICD-10-CM | POA: Diagnosis not present

## 2016-01-01 DIAGNOSIS — M545 Low back pain, unspecified: Secondary | ICD-10-CM

## 2016-01-01 DIAGNOSIS — I120 Hypertensive chronic kidney disease with stage 5 chronic kidney disease or end stage renal disease: Secondary | ICD-10-CM | POA: Insufficient documentation

## 2016-01-01 MED ORDER — OXYCODONE HCL 10 MG PO TABS: 10.0000 mg | ORAL_TABLET | Freq: Three times a day (TID) | ORAL | Status: DC | PRN

## 2016-01-01 NOTE — Progress Notes (Signed)
Subjective:    [name], male    DOB: Mar 03, 1966, 50 y.o.   MRN: [id number]  HPI: [name] is a 50 year old male who returns for follow up appointment and medication refill. He states his pain is located in his upper and lower back. He rates his pain 8. His current exercise regime is attending physical therapy twice a week and attending [facility] once a week.   He's been on Hemodialysis for two years, three days a week.  Past Medical History: Cord Compression Myelopathy Past Surgical History: T-1- T-7 Posterior Lateral Fusion with Pedicule screw instrumentation via Dr. Annette [name].  Pain Inventory Average Pain 8 Pain Right Now 8 My pain is tingling and aching  In the last 24 hours, has pain interfered with the following? General activity 3 Relation with others 7 Enjoyment of life 7 What TIME of day is your pain at its worst? varies Sleep (in general) Fair  Pain is worse with: walking, standing and some activites Pain improves with: rest and medication Relief from Meds: na  Mobility use a walker how many minutes can you walk? 10 ability to climb steps?  no do you drive?  no  Function I need assistance with the following:  household duties and shopping  Neuro/Psych No problems in this area  Prior Studies Any changes since last visit?  no  Physicians involved in your care Any changes since last visit?  no   Family History  Problem Relation Age of Onset  . Diabetes Mother   . Diabetes Sister   . Diabetes Brother    Social History   Social History  . Marital Status: Single    Spouse Name: N/A  . Number of Children: N/A  . Years of Education: N/A   Social History Main Topics  . Smoking status: Never Smoker   . Smokeless tobacco: Never Used  . Alcohol Use: 0.6 oz/week    1 Cans of beer per week  . Drug Use: Yes    Special: Marijuana     Comment: ocassionaly   . Sexual Activity: Not Currently   Other Topics Concern    . None   Social History Narrative   [employer] man during the early 88s with deployments to the [place].  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  Divorce with 5 children, 3 boys and 2 girls.  Lives in [place].    Past Surgical History  Procedure Laterality Date  . Av fistula placement  05/03/2012    Procedure: ARTERIOVENOUS (AV) FISTULA CREATION;  Surgeon: Seth [name], MD;  Location: [facility];  Service: Vascular;  Laterality: Right;  . Tracheostomy tube placement N/A 08/20/2013    Procedure: TRACHEOSTOMY Revision;  Surgeon: Seth Quitter, MD;  Location: [facility];  Service: ENT;  Laterality: N/A;  . Tee without cardioversion N/A 07/10/2015    Procedure: TRANSESOPHAGEAL ECHOCARDIOGRAM (TEE);  Surgeon: Seth Hector, MD;  Location: [facility] ENDOSCOPY;  Service: Cardiovascular;  Laterality: N/A;   Past Medical History  Diagnosis Date  . Hypertension   . ESRD (end stage renal disease) on dialysis ([place])   . RETINAL DETACHMENT, HX OF 06/20/2007    Qualifier: Diagnosis of  By: Seth Bergamo RN, [name]    . Sleep apnea     USES CPAP  . Diabetes mellitus     INSULIN DEPENDENT DIABETES  . Discitis 07/2015   BP 134/57 mmHg  Pulse  67  SpO2 96%  Opioid Risk Score:   Fall Risk Score:  `1  Depression screen PHQ 2/9  Depression screen [facility] 2/9 01/01/2016 11/03/2015 09/30/2015 09/15/2015 08/10/2015 08/05/2015 07/20/2015  Decreased Interest 1 1 0 0 0 0 0  Down, Depressed, Hopeless 1 1 0 0 0 0 1  PHQ - 2 Score 2 2 0 0 0 0 1  Altered sleeping - 3 - - - - -  Tired, decreased energy - 0 - - - - -  Change in appetite - 0 - - - - -  Feeling bad or failure about yourself  - 1 - - - - -  Trouble concentrating - 0 - - - - -  Moving slowly or fidgety/restless - 0 - - - - -  Suicidal thoughts - 0 - - - - -  PHQ-9 Score - 6 - - - - -  Difficult doing work/chores - Somewhat difficult - - - - -    Review of Systems  All other systems reviewed and are  negative.      Objective:   Physical Exam  Constitutional: He is oriented to person, place, and time. He appears well-developed and well-nourished.  HENT:  Head: Normocephalic and atraumatic.  Neck: Normal range of motion. Neck supple.  Cardiovascular: Normal rate and regular rhythm.   Pulmonary/Chest: Effort normal and breath sounds normal.  Musculoskeletal:   Using walker for Normal Muscle Bulk and Muscle Testing Reveals: Upper Extremities: Full ROM and Muscle Strength 5/5 Thoracic Paraspinal Tenderness: T-3- T-5 T-t- T-9 Lower Extremities: Full ROM and Muscle Strength 5/5 Arises from chair with ease using walker for support Narrow Based Gait  Neurological: He is alert and oriented to person, place, and time.  Skin: Skin is warm and dry.  Psychiatric: He has a normal mood and affect.  Nursing note and vitals reviewed.         Assessment & Plan:  1. Functional deficits secondary to Cord compression secondary to Osteomyelitis/diskitis T4 and T5 with pathologic fracture: Continue Outpatient Physical and HEP as tolerated  2. Pain Management: Refilled: Oxycodone 10 mg one tablet every 8 hours as needed #90. Continue with Ice Therapy and to increase Activity as tolerated. Second script given to accommodate scheduled appointment  3. ESRD: On Hemodialysis: Nephrology Following   20 minutes of face to face patient care time was spent during this visit. All questions were encouraged and answered.  F/U in 1 month

## 2016-01-05 ENCOUNTER — Ambulatory Visit: Payer: Medicare Other

## 2016-01-05 DIAGNOSIS — M6281 Muscle weakness (generalized): Secondary | ICD-10-CM

## 2016-01-05 DIAGNOSIS — M4324 Fusion of spine, thoracic region: Secondary | ICD-10-CM | POA: Diagnosis not present

## 2016-01-05 DIAGNOSIS — R2681 Unsteadiness on feet: Secondary | ICD-10-CM

## 2016-01-05 DIAGNOSIS — E1169 Type 2 diabetes mellitus with other specified complication: Secondary | ICD-10-CM | POA: Diagnosis not present

## 2016-01-05 DIAGNOSIS — R262 Difficulty in walking, not elsewhere classified: Secondary | ICD-10-CM

## 2016-01-05 DIAGNOSIS — M869 Osteomyelitis, unspecified: Secondary | ICD-10-CM

## 2016-01-05 NOTE — Therapy (Signed)
[place], [place], [address] Phone: [phone]   Fax:  [phone]  Physical Therapy Treatment  Patient Details  Name: Seth [name] MRN: [id number] Date of Birth: Feb 20, 1966 Referring Provider: Dr. Zada Finders  Encounter Date: 01/05/2016      PT End of Session - 01/05/16 0855    Visit Number 19   Number of Visits 24   Date for PT Re-Evaluation 01/26/16   Authorization Type Medicare; G code at visit 10;  [place] at visit 15   PT Start Time 0855  pt was 10 mins late and requires increased rest time.    PT Stop Time 0935   PT Time Calculation (min) 40 min   Activity Tolerance Patient tolerated treatment well   Behavior During Therapy [facility] for tasks assessed/performed      Past Medical History  Diagnosis Date  . Hypertension   . ESRD (end stage renal disease) on dialysis ([place])   . 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 Sleep apnea     USES CPAP  . Diabetes mellitus     INSULIN DEPENDENT DIABETES  . Discitis 07/2015    Past Surgical History  Procedure Laterality Date  . Av fistula placement  05/03/2012    Procedure: ARTERIOVENOUS (AV) FISTULA CREATION;  Surgeon: Mal [name], MD;  Location: [facility];  Service: Vascular;  Laterality: Right;  . Tracheostomy tube placement N/A 08/20/2013    Procedure: TRACHEOSTOMY Revision;  Surgeon: Melida Quitter, MD;  Location: [place] [facility];  Service: ENT;  Laterality: N/A;  . Tee without cardioversion N/A 07/10/2015    Procedure: TRANSESOPHAGEAL ECHOCARDIOGRAM (TEE);  Surgeon: Josue Hector, MD;  [facility] ENDOSCOPY;  Service: Cardiovascular;  Laterality: N/A;    There were no vitals filed for this visit.  Visit Diagnosis:  Muscle weakness (generalized)  Unsteadiness on feet  Difficulty in walking  Fusion of spine of thoracic region  Diabetic osteomyelitis ([name])      Subjective Assessment - 01/05/16 0858     Subjective Pt reports compliance with HEP. Pt reports increased knee pain today, but attributes it to weather. Pain rates 8/10 in bilat knees.    Limitations Standing;Walking   Currently in Pain? Yes   Pain Score 8    Pain Location Knee   Pain Orientation Right;Left   Pain Descriptors / Indicators Aching   Pain Type Chronic pain   Pain Onset More than a month ago   Pain Frequency Constant                         [facility] Adult PT Treatment/Exercise - 01/05/16 0001    Ambulation/Gait   Assistive device Rolling walker   Ambulation Surface Level;Indoor   Gait velocity .65 meters/sec    Gait Comments 500 feet in 3 mins 55 secs    High Level Balance   High Level Balance Comments --   Neuro Re-ed    Neuro Re-ed Details  static balance: feet together, feet apart, modified tandem, with head rotation and looking up and down.   30 secs x 2 each , increased sway with head rotation.    Knee/Hip Exercises: Aerobic   Nustep L6, 6 mins    Knee/Hip Exercises: Standing   Heel Raises 20 reps   Knee Flexion 20 reps   Hip Flexion 20 reps   Hip Abduction 20 reps  Hip Extension 20 reps   Functional Squat --  held squat due to knee pain mini squat   Gait Training 40 feet walking in parallel bars with reciprocal UE use on rail during gait.                 PT Education - 01/05/16 1111    Education provided Yes   Education Details Bring al HEP to next visit to review   Person(s) Educated Patient   Methods Explanation   Comprehension Verbalized understanding          PT Short Term Goals - 12/15/15 0910    PT SHORT TERM GOAL #1   Title The patient will be able to ambulate 300 feet with RW needed for short distance community ambulation for appointments to PT and dialysis   11/05/15   Time 4   Period Weeks   Status Achieved   PT SHORT TERM GOAL #2   Title The patient will report an overall improvement in pain and function by 25%   Baseline Pt reports 50% improved     Time 4   Period Weeks   Status Achieved   PT SHORT TERM GOAL #3   Title Gait speed with RW improved to .27msec for community ambulation    Baseline 12/15/15 : .56 m/sec, but limited due to standing rest break to readjust clothing.    Time 4   Period Weeks   Status On-going           PT Long Term Goals - 12/15/15 0910    PT LONG TERM GOAL #1   Title The patient will be independent in safe self progression of HEP for further strengthening   12/03/15   Baseline 12/15/15: Continuing to establish and progress   Time 8   Period Weeks   Status On-going   PT LONG TERM GOAL #2   Title Shoulder, trunk and hip strength improved to 4-/5 grossly needed for standing longer periods of time for bathing, meal prep by 01/26/16.   Baseline 12/15/15: not formally reassessed.    Time 8   Period Weeks   Status Partially Met   PT LONG TERM GOAL #3   Title Timed up and Go test with appropriate assistive device improved to 13 sec indicating improved strength and gait speed for community mobility by 01/26/16.   Baseline 12/15/15: 18.87 secs with RW   Time 8   Period Weeks   Status Partially Met   PT LONG TERM GOAL #4   Title Patient will be able to ambulate 600 feet with appropriate assistive device  needed for grocery shopping, going to church by 01/26/16.   Baseline 12/15/15: Pt ambulated 500 feet with RW and SBA in 4 mins 30 secs before seated rest break needed.     Time 8   Period Weeks   Status Partially Met   PT LONG TERM GOAL #5   Title BERG balance test improved by 4 points indicating decreased risk of falls by 01/26/16.   Baseline 12/15/15: Improved from 32/56 at eval to 35/56 pon 12/15/15.    Time 8   Period Weeks   Status On-going   PT LONG TERM GOAL #6   Title FOTO functional outcome score improved to 42% indicating decreased pain and improved function by 01/26/16.   Baseline 12/15/15: FOTO improved from 60% limited to 59% limited.    Time 8   Period Weeks   Status On-going   PT LONG TERM GOAL #7  Title Seth [name] will be able to walk with 2 SPC 600 feet with SBA on level surface for use in home by 01/26/16.   Time 4   Period Weeks   Status Revised               Plan - 01/05/16 0901    Clinical Impression Statement Retested gait speed and improved to .65 m/sec with RW for 3 mins 55 secs and 500 feet. . Therefore, goal for gait speed not yet met. Pt demonstrated understanidng and memorization of stanidng HEP as he performed without VCs and was able to perform 20 reps of each, improved from 10 x at last visit.    PT Next Visit Plan  Continue strength and static balance, core strength low level. Standing and mat exercises, gait as tolerated with SPC.    Emphasis on hips and walking distance. Asked him to bring in any HEP he has on paper.   PT Home Exercise Plan Review his HEP ( standing) and encourage importance of HEP.    Consulted and Agree with Plan of Care Patient        Problem List Patient Active Problem List   Diagnosis Date Noted  . Preventative health care 10/01/2015  . Osteoarthritis of right knee 09/01/2015  . Primary osteoarthritis of right knee   . Cord compression myelopathy ([employer]) 08/17/2015  . Osteomyelitis of thoracic spine ([place]) 08/16/2015  . Discitis of thoracic region   . Septic shock ([place]) 08/13/2015  . Acute respiratory failure with hypoxia ([place]) 08/13/2015  . Cord compression ([place]) 08/11/2015  . Back pain 08/11/2015  . Thoracic spine pain 08/11/2015  . Muscle strain of chest wall 07/21/2015  . Controlled type 2 diabetes mellitus with chronic kidney disease on chronic dialysis ([place]) 07/20/2015  . 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 Absolute anemia   . 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 OSA (obstructive sleep apnea) 10/14/2013  . Tracheostomy status ([place]) 09/17/2013  . Difficult airway for intubation 08/20/2013  . End stage renal disease on dialysis ([place]) 04/30/2012  . Hypocalcemia 04/30/2012  . Normocytic anemia  04/30/2012  . DM II (diabetes mellitus, type II), controlled ([place]) 06/20/2007  . DYSLIPIDEMIA 06/20/2007  . Morbid obesity ([place]) 06/20/2007  . Essential hypertension 06/20/2007    Seth [name], PT 01/05/2016, 11:17 AM  [facility] [address] [place], [place], [address] Phone: [phone]   Fax:  [phone]  Name: Seth [name] MRN: [id number] Date of Birth: 1965-10-27

## 2016-01-07 ENCOUNTER — Ambulatory Visit: Payer: Medicare Other | Admitting: Physical Therapy

## 2016-01-07 DIAGNOSIS — E1169 Type 2 diabetes mellitus with other specified complication: Secondary | ICD-10-CM | POA: Diagnosis not present

## 2016-01-07 DIAGNOSIS — R2681 Unsteadiness on feet: Secondary | ICD-10-CM

## 2016-01-07 DIAGNOSIS — M869 Osteomyelitis, unspecified: Secondary | ICD-10-CM | POA: Diagnosis not present

## 2016-01-07 DIAGNOSIS — R262 Difficulty in walking, not elsewhere classified: Secondary | ICD-10-CM

## 2016-01-07 DIAGNOSIS — M4324 Fusion of spine, thoracic region: Secondary | ICD-10-CM | POA: Diagnosis not present

## 2016-01-07 DIAGNOSIS — M6281 Muscle weakness (generalized): Secondary | ICD-10-CM | POA: Diagnosis not present

## 2016-01-07 NOTE — Therapy (Addendum)
[place], [place], [address] Phone: [phone]   Fax:  [phone]  Physical Therapy Treatment / Discharge Note  Patient Details  Name: Seth [name] MRN: [id number] Date of Birth: 1966-08-10 Referring Provider: Dr. Zada Finders  Encounter Date: 01/07/2016      PT End of Session - 01/07/16 1241    Visit Number 20   Number of Visits 24   Date for PT Re-Evaluation 01/26/16   Authorization Type Medicare; G code at visit 10;  KX at visit 15   PT Start Time 1110  pt was 10 minutes late   PT Stop Time 1149   PT Time Calculation (min) 39 min   Activity Tolerance Patient limited by pain   Behavior During Therapy [facility] for tasks assessed/performed      Past Medical History  Diagnosis Date  . Hypertension   . ESRD (end stage renal disease) on dialysis ([place])   . 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 Sleep apnea     USES CPAP  . Diabetes mellitus     INSULIN DEPENDENT DIABETES  . Discitis 07/2015    Past Surgical History  Procedure Laterality Date  . Av fistula placement  05/03/2012    Procedure: ARTERIOVENOUS (AV) FISTULA CREATION;  Surgeon: Mal [name], MD;  Location: [place];  Service: Vascular;  Laterality: Right;  . Tracheostomy tube placement N/A 08/20/2013    Procedure: TRACHEOSTOMY Revision;  Surgeon: Melida Quitter, MD;  Location: [place];  Service: ENT;  Laterality: N/A;  . Tee without cardioversion N/A 07/10/2015    Procedure: TRANSESOPHAGEAL ECHOCARDIOGRAM (TEE);  Surgeon: Josue Hector, MD;  Location[facility] ENDOSCOPY;  Service: Cardiovascular;  Laterality: N/A;    There were no vitals filed for this visit.  Visit Diagnosis:  Unsteadiness on feet  Difficulty in walking  Muscle weakness (generalized)      Subjective Assessment - 01/07/16 1114    Subjective " I am feeling more sore today in the knee's, but I think it's due to weather"     Currently in Pain? Yes   Pain Score 8    Pain Location Knee   Pain Orientation Right;Left   Pain Descriptors / Indicators Aching   Pain Type Chronic pain   Pain Onset More than a month ago   Pain Frequency Constant   Aggravating Factors  weather changes   Pain Relieving Factors meds,                          [facility] Adult PT Treatment/Exercise - 01/07/16 1116    Self-Care   Self-Care Other Self-Care Comments   Other Self-Care Comments  how to perform proper sit to stand using "nose over toes" technique educated to start from a higher surface and progressing to a lower surface.    Knee/Hip Exercises: Aerobic   Nustep L6 x 8 min   Knee/Hip Exercises: Standing   Heel Raises 20 reps   Knee Flexion --   Hip Flexion --   Knee/Hip Exercises: Seated   Sit to Sand 10 reps;Other (comment)  broke down into rocking x 10 with hands on back of chair    Knee/Hip Exercises: Supine   Bridges with Diona Foley Squeeze AROM;Strengthening;Both;2 sets;10 reps   Straight Leg Raises Right;2 sets;10 reps  PT Education - 01/07/16 1242    Education provided Yes   Education Details how to perform sit to stand with proper mechanics, updated HEP   Person(s) Educated Patient   Methods Explanation   Comprehension Verbalized understanding          PT Short Term Goals - 12/15/15 0910    PT SHORT TERM GOAL #1   Title The patient will be able to ambulate 300 feet with RW needed for short distance community ambulation for appointments to PT and dialysis   11/05/15   Time 4   Period Weeks   Status Achieved   PT SHORT TERM GOAL #2   Title The patient will report an overall improvement in pain and function by 25%   Baseline Pt reports 50% improved    Time 4   Period Weeks   Status Achieved   PT SHORT TERM GOAL #3   Title Gait speed with RW improved to .28msec for community ambulation    Baseline 12/15/15 : .56 m/sec, but limited due to standing rest break to readjust  clothing.    Time 4   Period Weeks   Status On-going           PT Long Term Goals - 12/15/15 0910    PT LONG TERM GOAL #1   Title The patient will be independent in safe self progression of HEP for further strengthening   12/03/15   Baseline 12/15/15: Continuing to establish and progress   Time 8   Period Weeks   Status On-going   PT LONG TERM GOAL #2   Title Shoulder, trunk and hip strength improved to 4-/5 grossly needed for standing longer periods of time for bathing, meal prep by 01/26/16.   Baseline 12/15/15: not formally reassessed.    Time 8   Period Weeks   Status Partially Met   PT LONG TERM GOAL #3   Title Timed up and Go test with appropriate assistive device improved to 13 sec indicating improved strength and gait speed for community mobility by 01/26/16.   Baseline 12/15/15: 18.87 secs with RW   Time 8   Period Weeks   Status Partially Met   PT LONG TERM GOAL #4   Title Patient will be able to ambulate 600 feet with appropriate assistive device  needed for grocery shopping, going to church by 01/26/16.   Baseline 12/15/15: Pt ambulated 500 feet with RW and SBA in 4 mins 30 secs before seated rest break needed.     Time 8   Period Weeks   Status Partially Met   PT LONG TERM GOAL #5   Title BERG balance test improved by 4 points indicating decreased risk of falls by 01/26/16.   Baseline 12/15/15: Improved from 32/56 at eval to 35/56 pon 12/15/15.    Time 8   Period Weeks   Status On-going   PT LONG TERM GOAL #6   Title FOTO functional outcome score improved to 42% indicating decreased pain and improved function by 01/26/16.   Baseline 12/15/15: FOTO improved from 60% limited to 59% limited.    Time 8   Period Weeks   Status On-going   PT LONG TERM GOAL #7   Title Seth [name] be able to walk with 2 SPC 600 feet with SBA on level surface for use in home by 01/26/16.   Time 4   Period Weeks   Status Revised        G:code - FOTO/ clinical Judgement  Mobility; Walking and  moving around Goal Status: CK Discharge Status: CK        Plan - 01/07/16 1242    Clinical Impression Statement Seth [name] was 10 minutes late todadays session. He reported incresaed pain which he attributed to the weather. Focused on strengthening of hips. Worked on sit to stand exercise, which he required breaking down of the sit to stand exercise into rocking acitivites with hands on chair for support to work on getting the "nose over toes" concept. Following practiceing rocking he was able to do 10 reps with hands on chair. post session he reported pain dropped to a 6/10.    PT Next Visit Plan  Continue strength and static balance, core strength low level. Standing and mat exercises, gait as tolerated with SPC.    Emphasis on hips and walking distance. Asked him to bring in any HEP he has on paper.   PT Home Exercise Plan sit to stand, bridges   Consulted and Agree with Plan of Care Patient        Problem List Patient Active Problem List   Diagnosis Date Noted  . Preventative health care 10/01/2015  . Osteoarthritis of right knee 09/01/2015  . Primary osteoarthritis of right knee   . Cord compression myelopathy ([place]) 08/17/2015  . Osteomyelitis of thoracic spine ([place]) 08/16/2015  . Discitis of thoracic region   . Septic shock ([place]) 08/13/2015  . Acute respiratory failure with hypoxia ([place]) 08/13/2015  . Cord compression ([place]) 08/11/2015  . Back pain 08/11/2015  . Thoracic spine pain 08/11/2015  . Muscle strain of chest wall 07/21/2015  . Controlled type 2 diabetes mellitus with chronic kidney disease on chronic dialysis ([place]) 07/20/2015  . 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 Absolute anemia   . 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 OSA (obstructive sleep apnea) 10/14/2013  . Tracheostomy status ([place]) 09/17/2013  . Difficult airway for intubation 08/20/2013  . End stage renal disease on dialysis ([place]) 04/30/2012  . Hypocalcemia 04/30/2012  . Normocytic  anemia 04/30/2012  . DM II (diabetes mellitus, type II), controlled ([place]) 06/20/2007  . DYSLIPIDEMIA 06/20/2007  . Morbid obesity ([place]) 06/20/2007  . Essential hypertension 06/20/2007   Starr Lake PT, DPT, LAT, ATC  01/07/2016  12:48 PM      [facility] [address] [place], [place], [address] Phone: [phone]   Fax:  [phone]  Name: Seth [name] MRN: [id number] Date of Birth: [birth year]    PHYSICAL THERAPY DISCHARGE SUMMARY  Visits from Start of Care: 20  Current functional level related to goals / functional outcomes: See goals   Remaining deficits: Unknown due to pt not returning.    Education / Equipment: HEP, theraband  Plan: Patient agrees to discharge.  Patient goals were partially met. Patient is being discharged due to not returning since the last visit.  ?????       Amiracle Neises PT, DPT, LAT, ATC  02/16/2016  2:37 PM

## 2016-01-12 ENCOUNTER — Ambulatory Visit: Payer: Medicare Other

## 2016-01-13 ENCOUNTER — Ambulatory Visit (INDEPENDENT_AMBULATORY_CARE_PROVIDER_SITE_OTHER): Payer: Medicare Other | Admitting: Internal Medicine

## 2016-01-13 ENCOUNTER — Encounter: Payer: Self-pay | Admitting: Internal Medicine

## 2016-01-13 ENCOUNTER — Ambulatory Visit (INDEPENDENT_AMBULATORY_CARE_PROVIDER_SITE_OTHER): Payer: Medicare Other | Admitting: Dietician

## 2016-01-13 ENCOUNTER — Telehealth: Payer: Self-pay | Admitting: Dietician

## 2016-01-13 ENCOUNTER — Encounter: Payer: Self-pay | Admitting: Dietician

## 2016-01-13 VITALS — BP 124/71 | HR 82 | Temp 97.3°F | Wt 303.3 lb

## 2016-01-13 DIAGNOSIS — Z713 Dietary counseling and surveillance: Secondary | ICD-10-CM

## 2016-01-13 DIAGNOSIS — Z6841 Body Mass Index (BMI) 40.0 and over, adult: Secondary | ICD-10-CM | POA: Diagnosis not present

## 2016-01-13 DIAGNOSIS — G9520 Unspecified cord compression: Secondary | ICD-10-CM

## 2016-01-13 DIAGNOSIS — N186 End stage renal disease: Secondary | ICD-10-CM

## 2016-01-13 DIAGNOSIS — Z79899 Other long term (current) drug therapy: Secondary | ICD-10-CM | POA: Diagnosis not present

## 2016-01-13 DIAGNOSIS — G9529 Other cord compression: Secondary | ICD-10-CM

## 2016-01-13 DIAGNOSIS — Z992 Dependence on renal dialysis: Secondary | ICD-10-CM

## 2016-01-13 DIAGNOSIS — I120 Hypertensive chronic kidney disease with stage 5 chronic kidney disease or end stage renal disease: Secondary | ICD-10-CM

## 2016-01-13 DIAGNOSIS — Z794 Long term (current) use of insulin: Secondary | ICD-10-CM | POA: Diagnosis not present

## 2016-01-13 DIAGNOSIS — E1122 Type 2 diabetes mellitus with diabetic chronic kidney disease: Secondary | ICD-10-CM | POA: Diagnosis present

## 2016-01-13 DIAGNOSIS — I10 Essential (primary) hypertension: Secondary | ICD-10-CM

## 2016-01-13 DIAGNOSIS — Z0000 Encounter for general adult medical examination without abnormal findings: Secondary | ICD-10-CM

## 2016-01-13 LAB — POCT GLYCOSYLATED HEMOGLOBIN (HGB A1C): Hemoglobin A1C: 7.8

## 2016-01-13 LAB — GLUCOSE, CAPILLARY: Glucose-Capillary: 232 mg/dL — ABNORMAL HIGH (ref 65–99)

## 2016-01-13 NOTE — Assessment & Plan Note (Signed)
Secondary to Staph lugdunensis osteomyelitis/diskitis s/p T1-T7 posterior lateral fusion in November 2016. Patient completed 6 weeks antibiotics. Patient currently working with PT 2-3x per week and with Rehab Medicine. He says he is gaining some strength and mobility, but is not where he wants to be. He realizes this will be a long process and he is motivated to continue therapy. He says he will be at a lower weight when he follows up on next visit. He says his pain is controlled fairly well.  A/P: Slowly improving. Patient seems motivated to continue therapy and lose weight. He understands that it will be at least several months before he is at his desired mobility. His pain medication is provided by Rehab Medicine. -Continue working with PT and PMR -Encouraged weight loss

## 2016-01-13 NOTE — Progress Notes (Signed)
[name], male   DOB: [birth year], 50 y.o.   MRN: [id number]   Subjective:   [name] male   DOB: 03/26/1966 50 y.o.   MRN: [id number]  HPI: Seth [name] is a 50 y.o. male with PMH as listed below who presents for management of his T2DM and HTN. Please see problem based charting for status of patient's chronic medical issues.   Past Medical History  Diagnosis Date  . Hypertension   . ESRD (end stage renal disease) on dialysis ([place])   . 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 Sleep apnea     USES CPAP  . Diabetes mellitus     INSULIN DEPENDENT DIABETES  . Discitis 07/2015   Current Outpatient Prescriptions  Medication Sig Dispense Refill  . amLODipine (NORVASC) 10 MG tablet Take 1 tablet (10 mg total) by mouth daily. 30 tablet 0  . aspirin EC 81 MG tablet Take 81 mg by mouth daily.    Marland Kitchen atorvastatin (LIPITOR) 40 MG tablet Take 1 tablet (40 mg total) by mouth daily. 30 tablet 11  . calcium acetate (PHOSLO) 667 MG capsule Take 3 capsules (2,001 mg total) by mouth 3 (three) times daily with meals.    . cinacalcet (SENSIPAR) 30 MG tablet Take 1 tablet (30 mg total) by mouth daily with breakfast. 60 tablet   . Darbepoetin Alfa (ARANESP) 100 MCG/0.5ML SOSY injection Inject 1 mL (200 mcg total) into the vein every Monday with hemodialysis. 4.2 mL   . insulin aspart (NOVOLOG) 100 UNIT/ML injection Inject 0-15 Units into the skin 3 (three) times daily with meals. CBG < 70: drink orange juice and recheck glucose or use glucose tabs CBG > 400: call MD   CBG 70 - 120: 0 units CBG 121 - 150: 3 units CBG 151 - 200: 4 units CBG 201 - 250: 7 units CBG 251 - 300: 11 units CBG 301 - 350: 15 units CBG 351 - 400: 20 units 1 vial 12  . glimepiride (AMARYL) 2 MG tablet Take 1 tablet (2 mg total) by mouth daily with breakfast. 30 tablet 0  . hydrocerin (EUCERIN) CREA Apply 1 application topically 2 (two) times  daily. 454 g 0  . multivitamin (RENA-VIT) TABS tablet Take 1 tablet by mouth at bedtime. 30 tablet 0  . Oxycodone HCl 10 MG TABS Take 1 tablet (10 mg total) by mouth every 8 (eight) hours as needed. 90 tablet 0  . senna-docusate (SENOKOT-S) 8.6-50 MG tablet Take 2 tablets by mouth at bedtime. 100 tablet 0   No current facility-administered medications for this visit.   Family History  Problem Relation Age of Onset  . Diabetes Mother   . Diabetes Sister   . Diabetes Brother    Social History   Social History  . Marital Status: Single    Spouse Name: N/A  . Number of Children: N/A  . Years of Education: N/A   Social History Main Topics  . Smoking status: Never Smoker   . Smokeless tobacco: Never Used  . Alcohol Use: 0.6 oz/week    1 Cans of beer per week  . Drug Use: Yes    Special: Marijuana     Comment: ocassionaly   . Sexual Activity: Not Currently   Other Topics Concern  . 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  Divorce with 5 children, 3 boys and 2 girls.  Lives in [place].    Review of Systems: Review of Systems  Constitutional: Negative for fever and chills.  Respiratory: Negative for shortness of breath.   Cardiovascular: Negative for chest pain and leg swelling.  Gastrointestinal: Negative for nausea, vomiting, abdominal pain and diarrhea.  Musculoskeletal: Positive for back pain. Negative for falls.  Neurological: Negative for sensory change.    Objective:  Physical Exam: Filed Vitals:   01/13/16 1324  BP: 124/71  Pulse: 82  Temp: 97.3 F (36.3 C)  TempSrc: Oral  Weight: 303 lb 4.8 oz (137.576 kg)  SpO2: 98%   Physical Exam  Constitutional: He is oriented to person, place, and time.  Pleasant, obese male, in wheelchair  HENT:  Head: Normocephalic and atraumatic.  Cardiovascular:  Normal rate and regular rhythm.   Pulses:      Dorsalis pedis pulses are 2+ on the right side, and 2+ on the left side.  Pulmonary/Chest: Effort normal. No respiratory distress. He has no wheezes. He exhibits no tenderness.  Abdominal: Soft. There is no tenderness.  Musculoskeletal:       Thoracic back: He exhibits tenderness.  Trace pitting edema bilateral lower extremities  Neurological: He is alert and oriented to person, place, and time.  Psychiatric: He has a normal mood and affect.    Assessment & Plan:  Please see problem list for current assessment and plan.

## 2016-01-13 NOTE — Assessment & Plan Note (Signed)
Discussed option for screening colonoscopy at length with patient. He tells me his mother was diagnosed with colon cancer in her late 75s. He is ESRD on HD and at this time may not benefit from this procedure. He is hesitant to undergo a colonoscopy and we will defer at this time. If patient were to progress with weight reduction and become a good candidate for renal transplant, then we would reconsider screening.  A/P:  Screening colonoscopy deferred at this time as it may not provide the long-term benefit in this end-stage renal disease patient on dialysis.

## 2016-01-13 NOTE — Patient Instructions (Signed)
It was a pleasure to see you again Seth [name]. Please continue your dialysis and physical therapy sessions. Please take your Amlodipine once day, not twice a day.   Your Hemoglobin A1C is 7.8 today. Please continue your exercise and diet modifications.  Follow up with [place] in 3 months.

## 2016-01-13 NOTE — Assessment & Plan Note (Addendum)
Patient currently on Novolog sliding scale TID with meals and Glimepiride 2 mg daily. His last A1C was 6.2. Patient is unsure if he is taking the Glimepiride. A1C today is 7.8.  A/P: A1C showing worsened diabetic control. He is unsure if he has been taking Glimepiride which may contribute. However, may need to consider a switch to Glipizide or other medication such as Januvia as Glimepiride can increase risk for hypoglycemia in renal/dialysis patients. Have continued for now, but will readdress this on next visit. -Continue Novolog SSI -Continue Glimepiride 2 mg daily -> consider switch to Glipizide, DPP4, or other agent -Hgb A1C in 3 months -Foot exam completed today and DP pulses +2 bilaterally

## 2016-01-13 NOTE — Assessment & Plan Note (Signed)
BP Readings from Last 3 Encounters:  01/13/16 124/71  01/01/16 134/57  12/29/15 128/80   Currently on Amlodipine 10 mg daily, however patient thinks he is taking it twice a day.   A/P: BPs stable and controlled. Advised patient to take Amlodipine once daily. -Continue Amlodipine 10 mg daily

## 2016-01-13 NOTE — Progress Notes (Signed)
  Medical Nutrition Therapy:  Appt start time: [id number] end time:  [id number]. Visit # 1  Last visit for MNT was 05/2012  Assessment:  Primary concerns today: weight loss Patient had limited time today so kept assessment brief and visit short. He goes to dialysis 3 times a week, has children for whom he cares and his goal is for weight loss so he can be around for them and to walk his daughters up the [place] when they marry. He would also like to get off insulin.  Preferred Learning Style: No preference indicated  Learning Readiness: Change in progress, but not ready for faster weight loss  ANTHROPOMETRICS: weight-303# , height-68", BMI-46 WEIGHT HISTORY:he has lost ~33# since 2013 SLEEP:not discussed today MEDICATIONS: amaryl 2 mg and Novolog with meals 3-4 times a day ~ 5  Units each time BLOOD SUGAR:A1C 7.8%- Goal is < 7% DIETARY INTAKE: Usual eating pattern includes 3 meals and ?- need to assess further about his  snacks per day..   24-hr recall:  B ( AM): 3 boiled eggs, 2 slices toast water  L ( PM): grilled chicken salad with [place] dressing, crackers D ( PM): 3 slices domino's large pizza with chicken, pineapple and mushroom and banana pepper toppings, diet soda Beverages: water, diet clear sodas  Usual physical activity: ADLs, need to discuss further in the future  Estimated energy needs:   Progress Towards Goal(s):  In progress.   Nutritional Diagnosis:  [place]-3.3 Overweight/obesity As related to excess calorie balance in past.  As evidenced by his BMI of 46.    Intervention:  Nutrition education about lifestyle change process, lower salt options. Coordination of care: suggest patient medications be transitioned to ones that will assist him with his goal  of coming off insulin and wanting weight loss which is very appropriate.   Teaching Method Utilized: Visual,Auditory, Hands on Handouts given during visit include: appointment for group diabetes and weight loss Barriers to  learning/adherence to lifestyle change: competing values- had to pick up children today Demonstrated degree of understanding via:  Teach Back   Monitoring/Evaluation:  Dietary intake, exercise, meter, and body weight in 2 week(s). For group weight management and diabetes meeting

## 2016-01-14 ENCOUNTER — Ambulatory Visit: Payer: Medicare Other | Attending: Student | Admitting: Physical Therapy

## 2016-01-15 NOTE — Progress Notes (Signed)
Internal Medicine Clinic Attending  Case discussed with Dr. Patel,Vishal at the time of the visit.  We reviewed the resident's history and exam and pertinent patient test results.  I agree with the assessment, diagnosis, and plan of care documented in the resident's note.  

## 2016-01-25 ENCOUNTER — Telehealth: Payer: Self-pay | Admitting: Pharmacist

## 2016-01-25 NOTE — Telephone Encounter (Signed)
Calling to follow up after starting atorvastatin 09/30/15. Patient reports tolerability and states he has no concerns at this time. Consistent refills per [facility] outpatient pharmacy

## 2016-01-26 ENCOUNTER — Ambulatory Visit: Payer: Medicare Other | Admitting: Physical Therapy

## 2016-01-26 NOTE — Telephone Encounter (Signed)
Reminded patient of appointment this Thursday. He said he may not be abel to make it and that he would call and let us know.

## 2016-01-28 ENCOUNTER — Encounter: Payer: Non-veteran care | Admitting: Dietician

## 2016-02-04 ENCOUNTER — Encounter: Payer: Medicare Other | Admitting: Registered Nurse

## 2016-02-09 ENCOUNTER — Encounter: Payer: Self-pay | Admitting: Registered Nurse

## 2016-02-09 ENCOUNTER — Encounter: Payer: Medicare Other | Attending: Physical Medicine & Rehabilitation | Admitting: Registered Nurse

## 2016-02-09 ENCOUNTER — Ambulatory Visit: Payer: Medicare Other | Admitting: Registered Nurse

## 2016-02-09 VITALS — BP 142/58 | HR 84 | Resp 14

## 2016-02-09 DIAGNOSIS — M1711 Unilateral primary osteoarthritis, right knee: Secondary | ICD-10-CM

## 2016-02-09 DIAGNOSIS — R202 Paresthesia of skin: Secondary | ICD-10-CM | POA: Insufficient documentation

## 2016-02-09 DIAGNOSIS — N186 End stage renal disease: Secondary | ICD-10-CM | POA: Insufficient documentation

## 2016-02-09 DIAGNOSIS — E119 Type 2 diabetes mellitus without complications: Secondary | ICD-10-CM | POA: Diagnosis not present

## 2016-02-09 DIAGNOSIS — G894 Chronic pain syndrome: Secondary | ICD-10-CM | POA: Diagnosis not present

## 2016-02-09 DIAGNOSIS — M546 Pain in thoracic spine: Secondary | ICD-10-CM

## 2016-02-09 DIAGNOSIS — M545 Low back pain, unspecified: Secondary | ICD-10-CM

## 2016-02-09 DIAGNOSIS — Z833 Family history of diabetes mellitus: Secondary | ICD-10-CM | POA: Insufficient documentation

## 2016-02-09 DIAGNOSIS — Z992 Dependence on renal dialysis: Secondary | ICD-10-CM | POA: Insufficient documentation

## 2016-02-09 DIAGNOSIS — G9520 Unspecified cord compression: Secondary | ICD-10-CM

## 2016-02-09 DIAGNOSIS — G128 Other spinal muscular atrophies and related syndromes: Secondary | ICD-10-CM | POA: Insufficient documentation

## 2016-02-09 DIAGNOSIS — R200 Anesthesia of skin: Secondary | ICD-10-CM | POA: Diagnosis not present

## 2016-02-09 DIAGNOSIS — I120 Hypertensive chronic kidney disease with stage 5 chronic kidney disease or end stage renal disease: Secondary | ICD-10-CM | POA: Insufficient documentation

## 2016-02-09 DIAGNOSIS — H3320 Serous retinal detachment, unspecified eye: Secondary | ICD-10-CM | POA: Insufficient documentation

## 2016-02-09 DIAGNOSIS — G4730 Sleep apnea, unspecified: Secondary | ICD-10-CM | POA: Diagnosis not present

## 2016-02-09 DIAGNOSIS — Z5181 Encounter for therapeutic drug level monitoring: Secondary | ICD-10-CM

## 2016-02-09 DIAGNOSIS — Z79899 Other long term (current) drug therapy: Secondary | ICD-10-CM | POA: Diagnosis not present

## 2016-02-09 DIAGNOSIS — M4644 Discitis, unspecified, thoracic region: Secondary | ICD-10-CM | POA: Diagnosis not present

## 2016-02-09 DIAGNOSIS — Z794 Long term (current) use of insulin: Secondary | ICD-10-CM | POA: Diagnosis not present

## 2016-02-09 DIAGNOSIS — Z79891 Long term (current) use of opiate analgesic: Secondary | ICD-10-CM | POA: Insufficient documentation

## 2016-02-09 MED ORDER — OXYCODONE HCL 10 MG PO TABS: 10.0000 mg | ORAL_TABLET | Freq: Three times a day (TID) | ORAL | Status: DC | PRN

## 2016-02-09 NOTE — Progress Notes (Signed)
Subjective:    [name], male    DOB: 05/31/1966, 50 y.o.   MRN: [id number]  HPI: Seth [name] is a 50 year old male who returns for follow up appointment and medication refill. He states his pain is located in his mid- back. He rates his pain 7. His current exercise regime is attending physical therapy twice a week and attending [facility] once a week.   He's on Hemodialysis for  three days a week.  Past Medical History: Cord Compression Myelopathy Past Surgical History: T-1- T-7 Posterior Lateral Fusion with Pedicule screw instrumentation via Dr. Annette Stable.  Pain Inventory Average Pain 7 Pain Right Now 7 My pain is sharp and aching  In the last 24 hours, has pain interfered with the following? General activity 6 Relation with others 9 Enjoyment of life 8 What TIME of day is your pain at its worst? all Sleep (in general) Fair  Pain is worse with: walking, bending, standing and some activites Pain improves with: rest and medication Relief from Meds: 7  Mobility walk with assistance use a walker ability to climb steps?  no do you drive?  no  Function disabled: date disabled .  Neuro/Psych No problems in this area  Prior Studies Any changes since last visit?  no  Physicians involved in your care Any changes since last visit?  no   Family History  Problem Relation Age of Onset  . Diabetes Mother   . Diabetes Sister   . Diabetes Brother    Social History   Social History  . Marital Status: Single    Spouse Name: N/A  . Number of Children: N/A  . Years of Education: N/A   Social History Main Topics  . Smoking status: Never Smoker   . Smokeless tobacco: Never Used  . Alcohol Use: 0.6 oz/week    1 Cans of beer per week  . Drug Use: Yes    Special: Marijuana     Comment: ocassionaly   . Sexual Activity: Not Currently   Other Topics Concern  . 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  Divorce with 5 children, 3 boys and 2 girls.  Lives in [place].    Past Surgical History  Procedure Laterality Date  . Av fistula placement  05/03/2012    Procedure: ARTERIOVENOUS (AV) FISTULA CREATION;  Surgeon: Mal [name], MD;  Location: [facility];  Service: Vascular;  Laterality: Right;  . Tracheostomy tube placement N/A 08/20/2013    Procedure: TRACHEOSTOMY Revision;  Surgeon: Melida Quitter, MD;  Location: [facility];  Service: ENT;  Laterality: N/A;  . Tee without cardioversion N/A 07/10/2015    Procedure: TRANSESOPHAGEAL ECHOCARDIOGRAM (TEE);  Surgeon: Josue Hector, MD;  Location[facility];  Service: Cardiovascular;  Laterality: N/A;   Past Medical History  Diagnosis Date  . Hypertension   . ESRD (end stage renal disease) on dialysis ([place])   . 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 Sleep apnea     USES CPAP  . Diabetes mellitus     INSULIN DEPENDENT DIABETES  . Discitis 07/2015   BP 142/58 mmHg  Pulse 84  Resp 14  SpO2 97%  Opioid Risk Score:   Fall Risk  Score:  `1  Depression screen PHQ 2/9  Depression screen [facility] 2/9 01/13/2016 01/01/2016 11/03/2015 09/30/2015 09/15/2015 08/10/2015 08/05/2015  Decreased Interest 0 1 1 0 0 0 0  Down, Depressed, Hopeless 0 1 1 0 0 0 0  PHQ - 2 Score 0 2 2 0 0 0 0  Altered sleeping - - 3 - - - -  Tired, decreased energy - - 0 - - - -  Change in appetite - - 0 - - - -  Feeling bad or failure about yourself  - - 1 - - - -  Trouble concentrating - - 0 - - - -  Moving slowly or fidgety/restless - - 0 - - - -  Suicidal thoughts - - 0 - - - -  PHQ-9 Score - - 6 - - - -  Difficult doing work/chores - - Somewhat difficult - - - -      Review of Systems  All other systems reviewed and are negative.      Objective:   Physical Exam  Constitutional: He is  oriented to person, place, and time. He appears well-developed and well-nourished.  HENT:  Head: Normocephalic and atraumatic.  Neck: Normal range of motion. Neck supple.  Cardiovascular: Normal rate and regular rhythm.   Pulmonary/Chest: Effort normal and breath sounds normal.  Musculoskeletal:  Normal Muscle Bulk and Muscle Testing Reveals: Upper Extremities: Full ROM and Muscle Strength 5/5 Thoracic Paraspinal Tenderness: T-7- T-9 Mainly Left Side Lower Extremities: Full ROM and Muscle Strength 5/5 Arises from chair slowly using walker for support Narrow Based Gait  Neurological: He is alert and oriented to person, place, and time.  Skin: Skin is warm and dry.  Psychiatric: He has a normal mood and affect.  Nursing note and vitals reviewed.         Assessment & Plan:  1. 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 Pain Management: Refilled: Oxycodone 10 mg one tablet every 8 hours as needed #90. We will continue the opioid monitoring program, this consists of regular clinic visits, examinations, urine drug screen, pill counts as well as use of [place] Controlled Substance Reporting System.   Continue with Ice Therapy and to increase Activity as tolerated.  3. ESRD: On Hemodialysis: Nephrology Following   20 minutes of face to face patient care time was spent during this visit. All questions were encouraged and answered.  F/U in 1 month

## 2016-02-10 MED FILL — AMLODIPINE BESYLATE 10 MG T: 10 | 30 days supply | Qty: 60 | Fill #1

## 2016-02-10 MED FILL — ATORVASTATIN 40 MG TABLET: 40 | 30 days supply | Qty: 30 | Fill #2

## 2016-02-16 ENCOUNTER — Ambulatory Visit: Payer: Medicare Other | Admitting: Physical Therapy

## 2016-02-18 ENCOUNTER — Encounter: Payer: Non-veteran care | Admitting: Physical Therapy

## 2016-02-23 ENCOUNTER — Encounter: Payer: Non-veteran care | Admitting: Physical Therapy

## 2016-02-25 ENCOUNTER — Encounter: Payer: Non-veteran care | Admitting: Physical Therapy

## 2016-03-01 IMAGING — CT CT L SPINE W/O CM
3 of 4 series · 12 of 33 positions shown, 14 images · non-contrast
Comparison: None available.

CLINICAL DATA: Initial evaluation for severe upper back pain with
tingling in both legs, worse on the right.

EXAM:
CT THORACIC AND LUMBAR SPINE WITHOUT CONTRAST
TECHNIQUE: Multidetector CT imaging of the thoracic and lumbar spine was
performed without contrast. Multiplanar CT image reconstructions
were also generated.

[Series 4: l-spine 2.0 st · axial · 0.39mm/px · z∈[+855,+1041]mm · 4 of 135 slices shown, 5 images]
[im 21/135  soft-tissue]
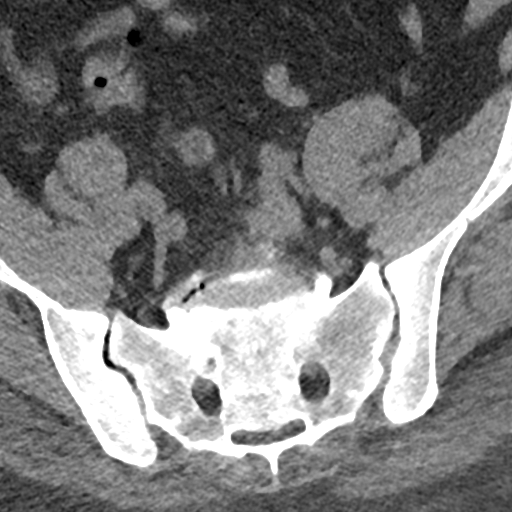
[im 21/135  bone]
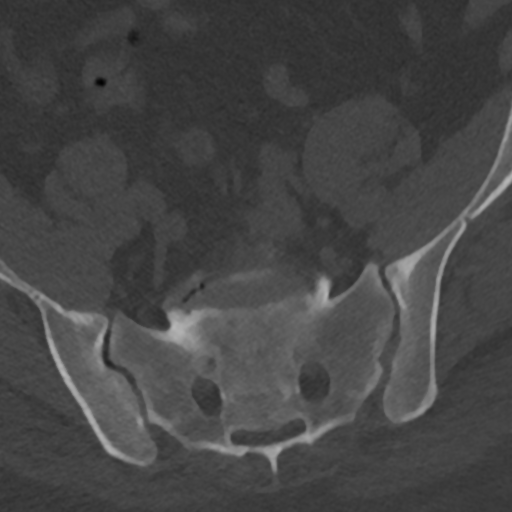
[im 52/135  bone]
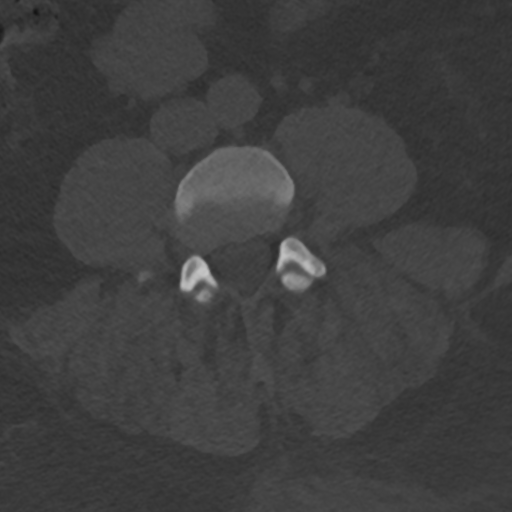
[im 83/135  bone]
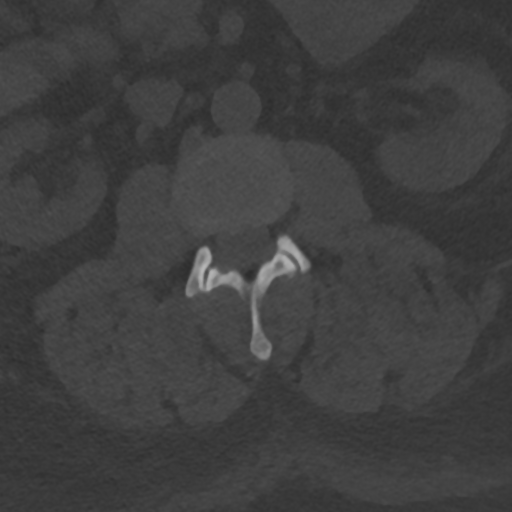
[im 114/135  bone]
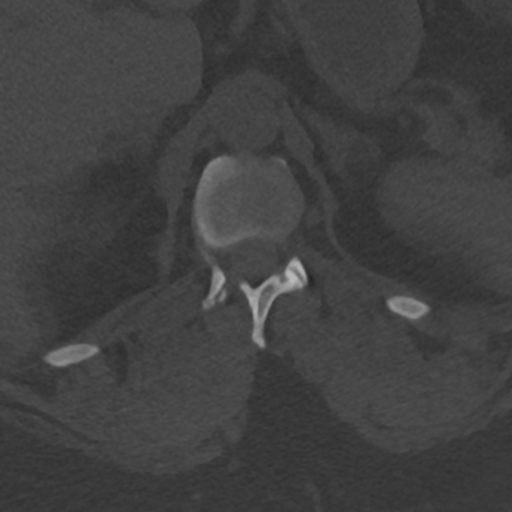

[Series 6: l-spine 2.0 cor bone · coronal · 0.40mm/px · 3 of 74 slices shown]
[im 19/74  bone]
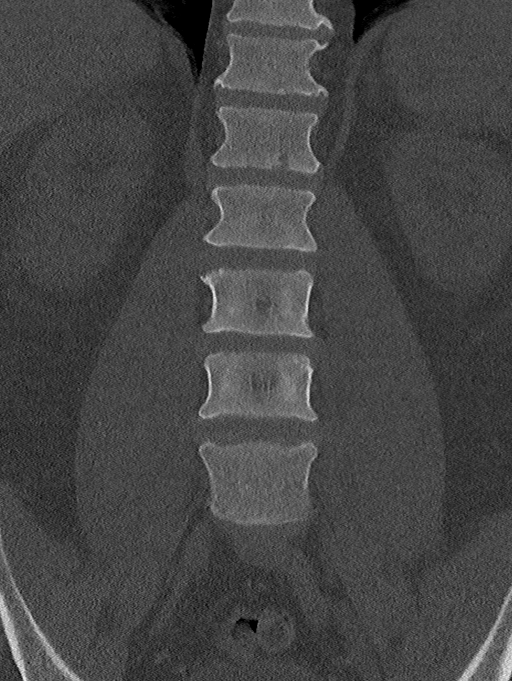
[im 37/74  bone]
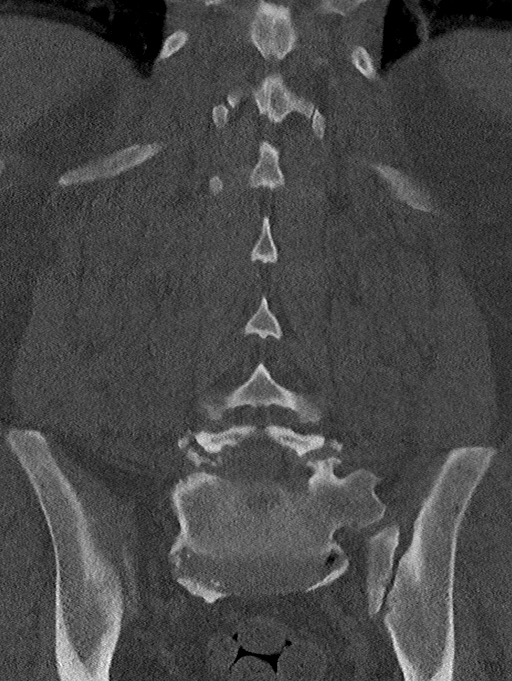
[im 55/74  bone]
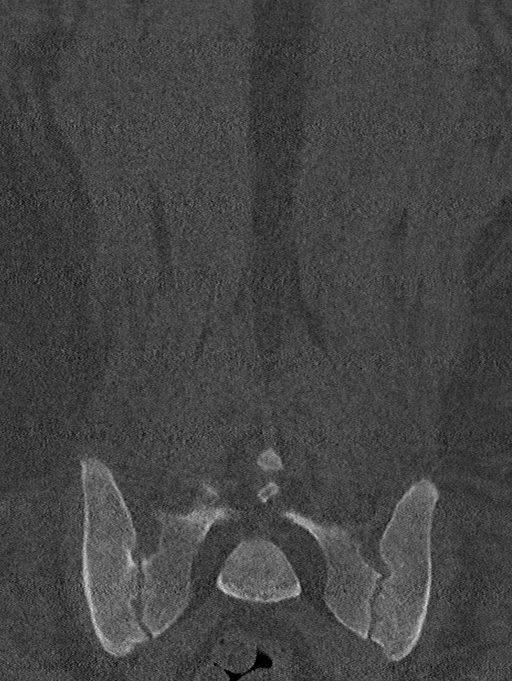

[Series 7: l-spine 2.0 sag bone · sagittal · 0.43mm/px · 5 of 52 slices shown, 6 images]
[im 18/52  bone]
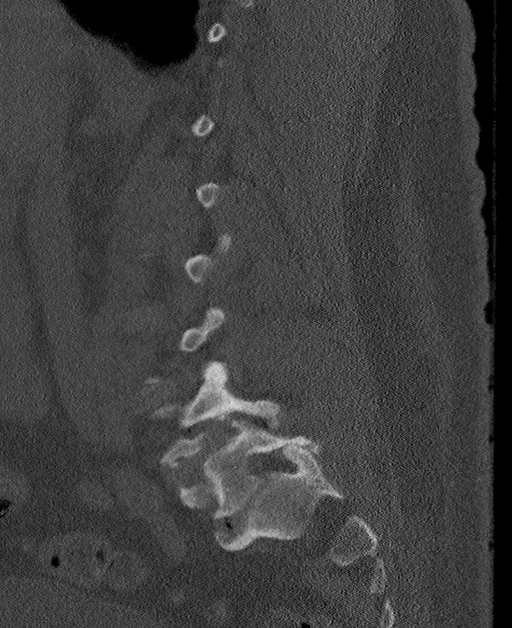
[im 22/52  bone]
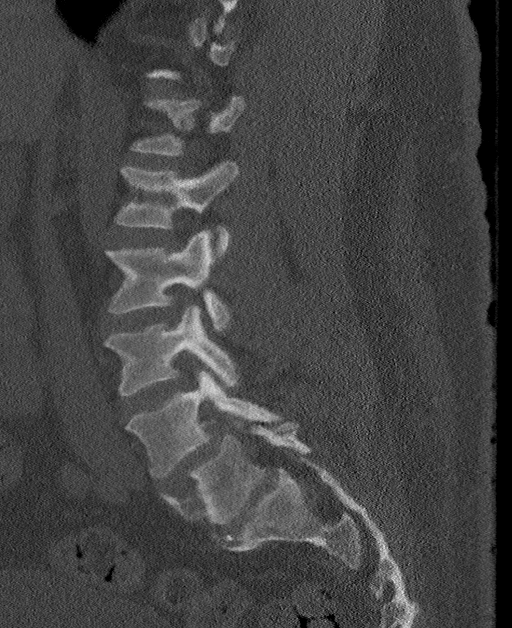
[im 26/52  soft-tissue]
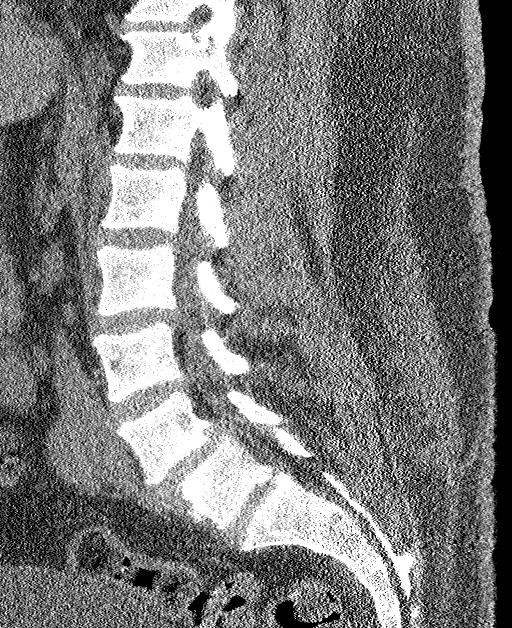
[im 26/52  bone]
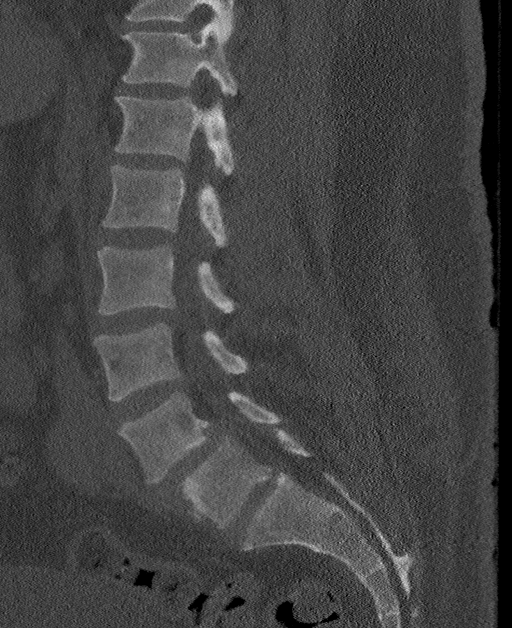
[im 30/52  bone]
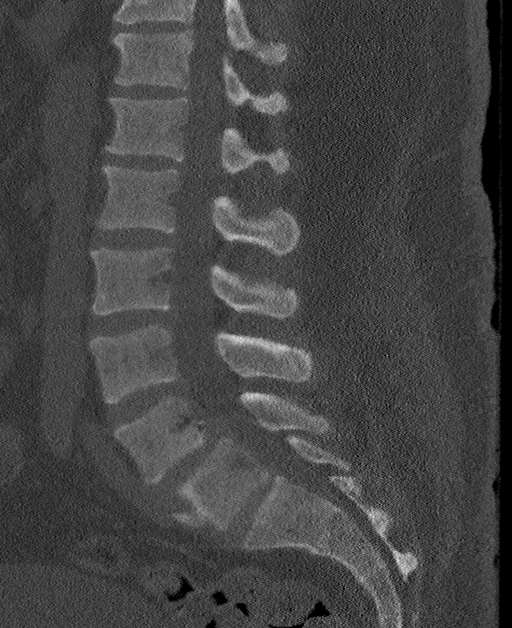
[im 35/52  bone]
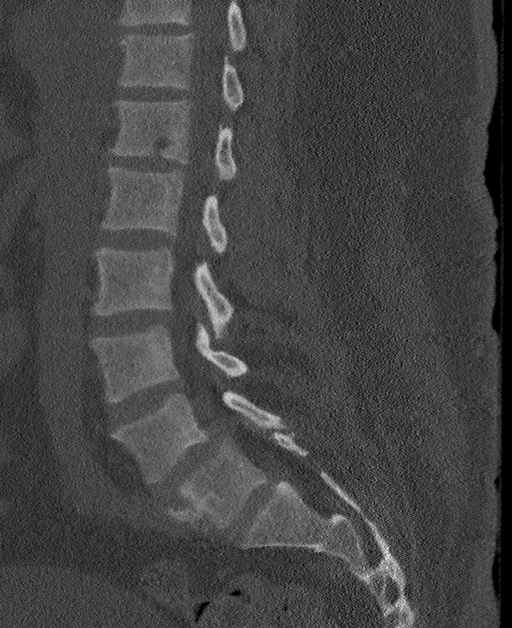

[12 of 33 positions shown; findings below may reference images not displayed]

FINDINGS: CT THORACIC SPINE FINDINGS

First rib-bearing vertebral body is labeled T1.

There is abnormal osseous destruction with fragmentation of the T4
and T5 vertebral bodies (series 7, image 21). There is associated
severe height loss with osseous fragmentation. There is associated
retropulsion of osseous fragments into the spinal canal by at least
8 mm. Additionally, there is probable abnormal soft tissue density
within the ventral epidural space at the level of T4 and T5. There
is moderate to severe canal stenosis with probable cord compression.
Cord appears displaced to the left. Extension into the posterior
elements with erosive changes of the bilateral pedicles of T5, and
left pedicle of T4. There is abnormal soft tissue about the T4 and
T5 vertebral bodies within the paraspinous soft tissue as well.
Erosive changes within the bilateral posteromedial fourth ribs. The
T4-5 intervertebral disc space is largely obliterated. Findings most
concerning for possible osteomyelitis discitis/infection. Underlying
malignancy could also be considered.

No other worrisome focal osseous lesions identified. Heterogeneous
lucent lesion with sclerotic margins within the T9 vertebral body
most consistent with a benign hemangioma.

No other acute abnormality within the thoracic spine. No other
significant degenerative changes appreciated.

Paraspinous soft tissues otherwise within normal limits.

CT LUMBAR SPINE FINDINGS

Grade 1 anterolisthesis of L4 on L5. Transitional lumbosacral
anatomy with partial sacralization of the L5 vertebral body.
Vertebral bodies otherwise normally aligned. Vertebral body heights
preserved. No fracture.

Single 7 mm lucent lesion within the anterior aspect of L3,
indeterminate, but of doubtful clinical significance. No other focal
osseous lesions.

Paraspinous soft tissues within normal limits. There is question of
a a nonobstructive stone within the inferior pole the right kidney.
No retroperitoneal adenopathy.

L4-5: 4 mm anterolisthesis. No associated pars defect. Severe
bilateral facet arthrosis. Diffuse disc bulge without definite focal
disc herniation. Ligamentum flavum hypertrophy. Mild to moderate
canal stenosis with probable at least moderate bilateral foraminal
narrowing.

L5-S1: Degenerative changes present at the anterior aspect of the L5
vertebral body. Mild disc bulge without significant stenosis.
IMPRESSION: CT THORACIC SPINE IMPRESSION

1. Destructive process centered at the T4-5 level with associated
osseous destruction and fragmentation of both the T4 and T5
vertebral bodies with focal kyphosis of the thoracic spine. Primary
concern is for possible osteomyelitis discitis/infection. Dialysis
induced spondyloarthropathy could also be considered in this patient
on dialysis. Possible malignancy could also conceivably have this
appearance but is less favored given the lack of additional
metastatic lesions. There is abnormal soft tissue density within the
ventral epidural space at this level with secondary moderate to
severe cord compression. Follow-up examination with dedicated MRI
may be helpful for further evaluation.
CT LUMBAR SPINE IMPRESSION:

1. No acute process within the lumbar spine.
2. Grade 1 anterolisthesis at L4-5 with associated disc bulge and
facet arthrosis with moderate canal and bilateral foraminal
stenosis.
3. Mild degenerative changes at L5-S1 without stenosis.
4. Transitional lumbosacral anatomy with partial sacralization of
the L5 vertebral body. Careful correlation with numbering system on
this study recommended prior to any potential surgical intervention.
Critical Value/emergent results were called by telephone at the time
of interpretation on 08/11/2015 at [DATE] to Dr. Ghotra , who verbally
acknowledged these results.

## 2016-03-15 ENCOUNTER — Ambulatory Visit (HOSPITAL_COMMUNITY)
Admission: EM | Admit: 2016-03-15 | Discharge: 2016-03-15 | Disposition: A | Discharge status: Home / Self Care | Payer: Medicare Other | Attending: Emergency Medicine | Admitting: Emergency Medicine

## 2016-03-15 ENCOUNTER — Ambulatory Visit (HOSPITAL_COMMUNITY): Payer: Medicare Other

## 2016-03-15 ENCOUNTER — Encounter (HOSPITAL_COMMUNITY): Payer: Self-pay | Admitting: *Deleted

## 2016-03-15 DIAGNOSIS — K5900 Constipation, unspecified: Secondary | ICD-10-CM | POA: Insufficient documentation

## 2016-03-15 DIAGNOSIS — Z794 Long term (current) use of insulin: Secondary | ICD-10-CM | POA: Insufficient documentation

## 2016-03-15 DIAGNOSIS — G4730 Sleep apnea, unspecified: Secondary | ICD-10-CM | POA: Insufficient documentation

## 2016-03-15 DIAGNOSIS — R140 Abdominal distension (gaseous): Secondary | ICD-10-CM | POA: Insufficient documentation

## 2016-03-15 DIAGNOSIS — Z79899 Other long term (current) drug therapy: Secondary | ICD-10-CM | POA: Diagnosis not present

## 2016-03-15 DIAGNOSIS — N186 End stage renal disease: Secondary | ICD-10-CM | POA: Diagnosis not present

## 2016-03-15 DIAGNOSIS — Z833 Family history of diabetes mellitus: Secondary | ICD-10-CM | POA: Insufficient documentation

## 2016-03-15 DIAGNOSIS — I120 Hypertensive chronic kidney disease with stage 5 chronic kidney disease or end stage renal disease: Secondary | ICD-10-CM | POA: Insufficient documentation

## 2016-03-15 DIAGNOSIS — E1122 Type 2 diabetes mellitus with diabetic chronic kidney disease: Secondary | ICD-10-CM | POA: Diagnosis not present

## 2016-03-15 DIAGNOSIS — Z7982 Long term (current) use of aspirin: Secondary | ICD-10-CM | POA: Insufficient documentation

## 2016-03-15 DIAGNOSIS — Z992 Dependence on renal dialysis: Secondary | ICD-10-CM | POA: Insufficient documentation

## 2016-03-15 MED ORDER — PEG 3350-KCL-NABCB-NACL-NASULF 236 G PO SOLR: 1000.0000 mL | Freq: Once | ORAL | Status: DC

## 2016-03-15 MED ORDER — POLYETHYLENE GLYCOL 3350 17 G PO PACK: 17.0000 g | PACK | Freq: Once | ORAL | Status: DC

## 2016-03-15 NOTE — ED Notes (Addendum)
Pt  Reports      Gas     Pain    And     Stomach  Pain    With  Symptoms      Of  Constipation  Pt  Reports  He  Has  Not  Had  A  Good  bm in  sev  Days  Pt is  A  diaysis  Pt dialysed  Yesterday       he  Is  Alert  And  Oriented     Ambulated  To  Room  With a  Walker        He  Is  Requesting      A  Laxative

## 2016-03-15 NOTE — ED Provider Notes (Signed)
CSN: [id number]     Arrival date & time 03/15/16  1712 History   First MD Initiated Contact with Patient 03/15/16 1751     Chief Complaint  Patient presents with  . Bloated   (Consider location/radiation/quality/duration/timing/severity/associated sxs/prior Treatment) HPI  The pt is a 50yo male with hx of ESRD on dialysis Mon/Wed/Fri, has been going as scheduled and doing well, presenting to [facility] today with c/o abdominal discomfort described as gas and bloating for 2 days.  He feels constipated so wanted to make sure he was "okay." he took a laxative with only minimal relief and a few small episodes of loose stools but still feels gasy.  He went to dialysis yesterday and has his labs checked, everything was good. Denies abdominal pain, nausea, vomiting or fever. No sick contacts. Reports hx of chronic abdominal hernia, denies hx of abdominal surgeries. No change in his hernia.   Past Medical History  Diagnosis Date  . Hypertension   . ESRD (end stage renal disease) on dialysis ([place])   . 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 Sleep apnea     USES CPAP  . Diabetes mellitus     INSULIN DEPENDENT DIABETES  . Discitis 07/2015   Past Surgical History  Procedure Laterality Date  . Av fistula placement  05/03/2012    Procedure: ARTERIOVENOUS (AV) FISTULA CREATION;  Surgeon: Mal [name], MD;  Location: [place];  Service: Vascular;  Laterality: Right;  . Tracheostomy tube placement N/A 08/20/2013    Procedure: TRACHEOSTOMY Revision;  Surgeon: Melida Quitter, MD;  Location: [place];  Service: ENT;  Laterality: N/A;  . Tee without cardioversion N/A 07/10/2015    Procedure: TRANSESOPHAGEAL ECHOCARDIOGRAM (TEE);  Surgeon: Josue Hector, MD;  Location: [facility] ENDOSCOPY;  Service: Cardiovascular;  Laterality: N/A;   Family History  Problem Relation Age of Onset  . Diabetes Mother   . Diabetes Sister   . Diabetes Brother    Social History  Substance Use  Topics  . Smoking status: Never Smoker   . Smokeless tobacco: Never Used  . Alcohol Use: 0.6 oz/week    1 Cans of beer per week    Review of Systems  Constitutional: Negative for fever, chills and appetite change.  Gastrointestinal: Positive for diarrhea ( "loose stools, took laxative"), constipation and abdominal distention ( bloating). Negative for nausea, vomiting, abdominal pain and blood in stool.  Genitourinary: Negative for dysuria, frequency, hematuria and flank pain.  Musculoskeletal: Negative for myalgias and back pain.    Allergies  Review of patient's allergies indicates no known allergies.  Home Medications   Prior to Admission medications   Medication Sig Start Date End Date Taking? Authorizing Provider  amLODipine (NORVASC) 10 MG tablet Take 1 tablet (10 mg total) by mouth daily. 09/19/13   Donita Brooks, NP  aspirin EC 81 MG tablet Take 81 mg by mouth daily.    Historical Provider, MD  atorvastatin (LIPITOR) 40 MG tablet Take 1 tablet (40 mg total) by mouth daily. 09/30/15   Zada Finders, MD  calcium acetate (PHOSLO) 667 MG capsule Take 3 capsules (2,001 mg total) by mouth 3 (three) times daily with meals. 08/17/15   Zada Finders, MD  cinacalcet (SENSIPAR) 30 MG tablet Take 1 tablet (30 mg total) by mouth daily with breakfast. 08/18/15   Zada Finders, MD  Darbepoetin Alfa (ARANESP) 100 MCG/0.5ML SOSY injection Inject 1 mL (200 mcg total) into  the vein every Monday with hemodialysis. 09/09/15   Bary Leriche, PA-C  glimepiride (AMARYL) 2 MG tablet Take 1 tablet (2 mg total) by mouth daily with breakfast. 09/09/15   Ivan Anchors Love, PA-C  hydrocerin (EUCERIN) CREA Apply 1 application topically 2 (two) times daily. 09/09/15   Ivan Anchors Love, PA-C  insulin aspart (NOVOLOG) 100 UNIT/ML injection Inject 0-15 Units into the skin 3 (three) times daily with meals. CBG < 70: drink orange juice and recheck glucose or use glucose tabs CBG > 400: call MD   CBG 70 - 120: 0 units CBG  121 - 150: 3 units CBG 151 - 200: 4 units CBG 201 - 250: 7 units CBG 251 - 300: 11 units CBG 301 - 350: 15 units CBG 351 - 400: 20 units 09/19/13   Donita Brooks, NP  multivitamin (RENA-VIT) TABS tablet Take 1 tablet by mouth at bedtime. 09/09/15   Bary Leriche, PA-C  Oxycodone HCl 10 MG TABS Take 1 tablet (10 mg total) by mouth every 8 (eight) hours as needed. 02/09/16   Bayard Hugger, NP  polyethylene glycol (GAVILYTE-G) 236 g solution Take 1,000 mLs by mouth once. Divided in two 500mg  doses over 4-6 hours 03/15/16   Noland Fordyce, PA-C  polyethylene glycol (MIRALAX / GLYCOLAX) packet Take 17 g by mouth once. 03/15/16   Noland Fordyce, PA-C  senna-docusate (SENOKOT-S) 8.6-50 MG tablet Take 2 tablets by mouth at bedtime. 09/09/15   Bary Leriche, PA-C   Meds Ordered and Administered this Visit  Medications - No data to display  BP 128/74 mmHg  Pulse 72  Temp(Src) 98.6 F (37 C) (Oral)  Resp 18  SpO2 100% No data found.   Physical Exam  Constitutional: He appears well-developed and well-nourished.  Obese male sitting in exam chair, NAD  HENT:  Head: Normocephalic and atraumatic.  Mouth/Throat: Oropharynx is clear and moist.  Eyes: Conjunctivae are normal. No scleral icterus.  Neck: Normal range of motion.  Cardiovascular: Normal rate, regular rhythm and normal heart sounds.   Pulmonary/Chest: Effort normal and breath sounds normal. No respiratory distress. He has no wheezes. He has no rales. He exhibits no tenderness.  Abdominal: Soft. Bowel sounds are normal. He exhibits no distension and no mass. There is no tenderness. There is no rebound and no guarding.  Obese abdomen. Soft, non-tender. Ventral hernia noted, soft, non-tender. Easily reducible.   Musculoskeletal: Normal range of motion.  Neurological: He is alert.  Skin: Skin is warm and dry.  Nursing note and vitals reviewed.   ED Course  Procedures (including critical care time)  Labs Review Labs Reviewed - No data to  display  Imaging Review Dg Abd 1 View  03/15/2016  CLINICAL DATA:  Bloated sensations with gas buildup for 2 days, possible constipation. No complaints of pain. EXAM: ABDOMEN - 1 VIEW COMPARISON:  None. FINDINGS: No dilated large or small bowel. There is gas in the rectum. No pathologic calcifications. No organomegaly. IMPRESSION: No bowel obstruction. Electronically Signed   By: Suzy Bouchard M.D.   On: 03/15/2016 18:32      MDM   1. Bloating   2. Constipation, unspecified constipation type    C/o bloating and constipation for about 2 days. He did go to dialysis yesterday and labs were normal. Pt appears well, non-toxic, is afebrile. Denies nausea or vomiting.  Abd- soft, non-tender. No hx of abdominal surgeries.  KUB: no evidence of bowel obstructions. Pt was passing gas during exam.  Encouraged to continue to use stool softeners. Rx: miralax packet to use daily or every other day for constipation maintenance, Gavilyte-G solution for more instant relief.   Encouraged f/u with PCP if not improving by the end of the week.  Discussed symptoms that warrant emergent care in the ED. Patient verbalized understanding and agreement with treatment plan.     Noland Fordyce, PA-C 03/15/16 [id number]

## 2016-03-15 NOTE — Discharge Instructions (Signed)
You may try the solution or the packets to help with your constipation.  The prescription solution for polyethylene glycol may cause more immediate relief, whereas the packets may be used daily or every other day for more gradual relief.     Constipation, Adult Constipation is when a person has fewer than three bowel movements a week, has difficulty having a bowel movement, or has stools that are dry, hard, or larger than normal. As people grow older, constipation is more common. A low-fiber diet, not taking in enough fluids, and taking certain medicines may make constipation worse.  CAUSES   Certain medicines, such as antidepressants, pain medicine, iron supplements, antacids, and water pills.   Certain diseases, such as diabetes, irritable bowel syndrome (IBS), thyroid disease, or depression.   Not drinking enough water.   Not eating enough fiber-rich foods.   Stress or travel.   Lack of physical activity or exercise.   Ignoring the urge to have a bowel movement.   Using laxatives too much.  SIGNS AND SYMPTOMS   Having fewer than three bowel movements a week.   Straining to have a bowel movement.   Having stools that are hard, dry, or larger than normal.   Feeling full or bloated.   Pain in the lower abdomen.   Not feeling relief after having a bowel movement.  DIAGNOSIS  Your health care provider will take a medical history and perform a physical exam. Further testing may be done for severe constipation. Some tests may include:  A barium enema X-ray to examine your rectum, colon, and, sometimes, your small intestine.   A sigmoidoscopy to examine your lower colon.   A colonoscopy to examine your entire colon. TREATMENT  Treatment will depend on the severity of your constipation and what is causing it. Some dietary treatments include drinking more fluids and eating more fiber-rich foods. Lifestyle treatments may include regular exercise. If these diet and  lifestyle recommendations do not help, your health care provider may recommend taking over-the-counter laxative medicines to help you have bowel movements. Prescription medicines may be prescribed if over-the-counter medicines do not work.  HOME CARE INSTRUCTIONS   Eat foods that have a lot of fiber, such as fruits, vegetables, whole grains, and beans.  Limit foods high in fat and processed sugars, such as french fries, hamburgers, cookies, candies, and soda.   A fiber supplement may be added to your diet if you cannot get enough fiber from foods.   Drink enough fluids to keep your urine clear or pale yellow.   Exercise regularly or as directed by your health care provider.   Go to the restroom when you have the urge to go. Do not hold it.   Only take over-the-counter or prescription medicines as directed by your health care provider. Do not take other medicines for constipation without talking to your health care provider first.  [place] IF:   You have bright red blood in your stool.   Your constipation lasts for more than 4 days or gets worse.   You have abdominal or rectal pain.   You have thin, pencil-like stools.   You have unexplained weight loss. MAKE SURE YOU:   Understand these instructions.  Will watch your condition.  Will get help right away if you are not doing well or get worse.   This information is not intended to replace advice given to you by your health care provider. Make sure you discuss any questions you have  with your health care provider.   Document Released: 06/24/2004 Document Revised: 10/17/2014 Document Reviewed: 07/08/2013 Elsevier Interactive Patient Education [employer].

## 2016-03-18 ENCOUNTER — Ambulatory Visit (HOSPITAL_COMMUNITY)
Admission: EM | Admit: 2016-03-18 | Discharge: 2016-03-18 | Disposition: A | Discharge status: Home / Self Care | Payer: Medicare Other | Attending: Family Medicine | Admitting: Family Medicine

## 2016-03-18 ENCOUNTER — Encounter (HOSPITAL_COMMUNITY): Payer: Self-pay | Admitting: Emergency Medicine

## 2016-03-18 DIAGNOSIS — K5900 Constipation, unspecified: Secondary | ICD-10-CM | POA: Diagnosis not present

## 2016-03-18 MED ORDER — POLYETHYLENE GLYCOL 3350 17 G PO PACK: 17.0000 g | PACK | Freq: Every day | ORAL | Status: DC

## 2016-03-18 MED ORDER — PEG 3350-KCL-NABCB-NACL-NASULF 236 G PO SOLR: 1000.0000 mL | Freq: Once | ORAL | Status: DC

## 2016-03-18 NOTE — ED Provider Notes (Signed)
CSN: [id number]     Arrival date & time 03/18/16  1301 History   None    Chief Complaint  Patient presents with  . Constipation   (Consider location/radiation/quality/duration/timing/severity/associated sxs/prior Treatment) HPI Comments: Patient is a 50 yo black male who presents with some ongoing constipation. He was seen 3 days ago and given go lytely and instructed to take Miralax. He did take half of the go lytely, and has not started miralax. He did have a bowel movement, but not a "big one". He continues to feel "blaoted". He has no abdominal pain. No nausea or vomiting is noted. No fevers or chills.   Patient is a 50 y.o. male presenting with constipation. The history is provided by the patient.  Constipation Associated symptoms: no abdominal pain, no diarrhea, no fever, no nausea and no vomiting     Past Medical History  Diagnosis Date  . Hypertension   . ESRD (end stage renal disease) on dialysis ([place])   . 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 Sleep apnea     USES CPAP  . Diabetes mellitus     INSULIN DEPENDENT DIABETES  . Discitis 07/2015   Past Surgical History  Procedure Laterality Date  . Av fistula placement  05/03/2012    Procedure: ARTERIOVENOUS (AV) FISTULA CREATION;  Surgeon: Mal [name], MD;  Location: [place];  Service: Vascular;  Laterality: Right;  . Tracheostomy tube placement N/A 08/20/2013    Procedure: TRACHEOSTOMY Revision;  Surgeon: Melida Quitter, MD;  Location: [place];  Service: ENT;  Laterality: N/A;  . Tee without cardioversion N/A 07/10/2015    Procedure: TRANSESOPHAGEAL ECHOCARDIOGRAM (TEE);  Surgeon: Josue Hector, MD;  Location: [facility] ENDOSCOPY;  Service: Cardiovascular;  Laterality: N/A;   Family History  Problem Relation Age of Onset  . Diabetes Mother   . Diabetes Sister   . Diabetes Brother    Social History  Substance Use Topics  . Smoking status: Never Smoker   . Smokeless tobacco:  Never Used  . Alcohol Use: No    Review of Systems  Constitutional: Negative for fever and chills.  Gastrointestinal: Positive for constipation. Negative for nausea, vomiting, abdominal pain, diarrhea and rectal pain.    Allergies  Review of patient's allergies indicates no known allergies.  Home Medications   Prior to Admission medications   Medication Sig Start Date End Date Taking? Authorizing Provider  amLODipine (NORVASC) 10 MG tablet Take 1 tablet (10 mg total) by mouth daily. 09/19/13  Yes Donita Brooks, NP  aspirin EC 81 MG tablet Take 81 mg by mouth daily.   Yes Historical Provider, MD  atorvastatin (LIPITOR) 40 MG tablet Take 1 tablet (40 mg total) by mouth daily. 09/30/15  Yes Zada Finders, MD  calcium acetate (PHOSLO) 667 MG capsule Take 3 capsules (2,001 mg total) by mouth 3 (three) times daily with meals. 08/17/15  Yes Zada Finders, MD  cinacalcet (SENSIPAR) 30 MG tablet Take 1 tablet (30 mg total) by mouth daily with breakfast. 08/18/15  Yes Zada Finders, MD  Darbepoetin Alfa (ARANESP) 100 MCG/0.5ML SOSY injection Inject 1 mL (200 mcg total) into the vein every Monday with hemodialysis. 09/09/15  Yes Ivan Anchors Love, PA-C  glimepiride (AMARYL) 2 MG tablet Take 1 tablet (2 mg total) by mouth daily with breakfast. 09/09/15  Yes [place], PA-C  hydrocerin (EUCERIN) CREA Apply 1 application topically 2 (two) times daily. 09/09/15  Yes Ivan Anchors Love, PA-C  insulin aspart (NOVOLOG) 100 UNIT/ML injection Inject 0-15 Units into the skin 3 (three) times daily with meals. CBG < 70: drink orange juice and recheck glucose or use glucose tabs CBG > 400: call MD   CBG 70 - 120: 0 units CBG 121 - 150: 3 units CBG 151 - 200: 4 units CBG 201 - 250: 7 units CBG 251 - 300: 11 units CBG 301 - 350: 15 units CBG 351 - 400: 20 units 09/19/13  Yes Donita Brooks, NP  multivitamin (RENA-VIT) TABS tablet Take 1 tablet by mouth at bedtime. 09/09/15  Yes Ivan Anchors Love, PA-C  Oxycodone HCl 10  MG TABS Take 1 tablet (10 mg total) by mouth every 8 (eight) hours as needed. 02/09/16  Yes Bayard Hugger, NP  senna-docusate (SENOKOT-S) 8.6-50 MG tablet Take 2 tablets by mouth at bedtime. 09/09/15  Yes Ivan Anchors Love, PA-C  polyethylene glycol (GAVILYTE-G) 236 g solution Take 1,000 mLs by mouth once. Divided in two 500mg  doses over 4-6 hours 03/18/16   Bjorn Pippin, PA-C  polyethylene glycol (MIRALAX / GLYCOLAX) packet Take 17 g by mouth daily. 03/18/16   Bjorn Pippin, PA-C   Meds Ordered and Administered this Visit  Medications - No data to display  BP 102/74 mmHg  Pulse 91  Temp(Src) 98.1 F (36.7 C) (Oral)  Resp 18  SpO2 95% No data found.   Physical Exam  Constitutional: He is oriented to person, place, and time. He appears well-developed and well-nourished. No distress.  Abdominal: Soft. Bowel sounds are normal. He exhibits mass. He exhibits no distension. There is no tenderness. There is no rebound and no guarding.  Non-obstructing umbilical hernia, reproducible  Neurological: He is alert and oriented to person, place, and time.  Skin: Skin is warm and dry. He is not diaphoretic.  Psychiatric: His behavior is normal.  Nursing note and vitals reviewed.   ED Course  Procedures (including critical care time)  Labs Review Labs Reviewed - No data to display  Imaging Review No results found.   Visual Acuity Review  Right Eye Distance:   Left Eye Distance:   Bilateral Distance:    Right Eye Near:   Left Eye Near:    Bilateral Near:         MDM   1. Constipation, unspecified constipation type    No emergent needs today. He has had a BM. Past KUB was non-obstructive. Ok to prescribe another Golytely (he requested) and suggest initiation of Miralax daily in his coffee. Miralax is safe in dialysis patient's as explained that constipation is  Common because of fluid restriction. Should he develop any worsening symptoms then suggest f/u in the ER, otherwise  increase fiber, miralax and this should be helpful.     Bjorn Pippin, PA-C 03/18/16 1335

## 2016-03-18 NOTE — Discharge Instructions (Signed)
Nice to meet you! Sorry you are having this problem. Ok to take ALL of the go lytely and use the Miralax daily for continued regularity. Feel better. If you start worsening or vomiting then that is emergent need and go to the ER.   Constipation, Adult Constipation is when a person has fewer than three bowel movements a week, has difficulty having a bowel movement, or has stools that are dry, hard, or larger than normal. As people grow older, constipation is more common. A low-fiber diet, not taking in enough fluids, and taking certain medicines may make constipation worse.  CAUSES   Certain medicines, such as antidepressants, pain medicine, iron supplements, antacids, and water pills.   Certain diseases, such as diabetes, irritable bowel syndrome (IBS), thyroid disease, or depression.   Not drinking enough water.   Not eating enough fiber-rich foods.   Stress or travel.   Lack of physical activity or exercise.   Ignoring the urge to have a bowel movement.   Using laxatives too much.  SIGNS AND SYMPTOMS   Having fewer than three bowel movements a week.   Straining to have a bowel movement.   Having stools that are hard, dry, or larger than normal.   Feeling full or bloated.   Pain in the lower abdomen.   Not feeling relief after having a bowel movement.  DIAGNOSIS  Your health care provider will take a medical history and perform a physical exam. Further testing may be done for severe constipation. Some tests may include:  A barium enema X-ray to examine your rectum, colon, and, sometimes, your small intestine.   A sigmoidoscopy to examine your lower colon.   A colonoscopy to examine your entire colon. TREATMENT  Treatment will depend on the severity of your constipation and what is causing it. Some dietary treatments include drinking more fluids and eating more fiber-rich foods. Lifestyle treatments may include regular exercise. If these diet and lifestyle  recommendations do not help, your health care provider may recommend taking over-the-counter laxative medicines to help you have bowel movements. Prescription medicines may be prescribed if over-the-counter medicines do not work.  HOME CARE INSTRUCTIONS   Eat foods that have a lot of fiber, such as fruits, vegetables, whole grains, and beans.  Limit foods high in fat and processed sugars, such as french fries, hamburgers, cookies, candies, and soda.   A fiber supplement may be added to your diet if you cannot get enough fiber from foods.   Drink enough fluids to keep your urine clear or pale yellow.   Exercise regularly or as directed by your health care provider.   Go to the restroom when you have the urge to go. Do not hold it.   Only take over-the-counter or prescription medicines as directed by your health care provider. Do not take other medicines for constipation without talking to your health care provider first.  [place] IF:   You have bright red blood in your stool.   Your constipation lasts for more than 4 days or gets worse.   You have abdominal or rectal pain.   You have thin, pencil-like stools.   You have unexplained weight loss. MAKE SURE YOU:   Understand these instructions.  Will watch your condition.  Will get help right away if you are not doing well or get worse.   This information is not intended to replace advice given to you by your health care provider. Make sure you discuss any questions  you have with your health care provider.   Document Released: 06/24/2004 Document Revised: 10/17/2014 Document Reviewed: 07/08/2013 Elsevier Interactive Patient Education [employer].

## 2016-03-18 NOTE — ED Notes (Signed)
Pt was here three days ago.  He states he completed the medication he was given here but he has still not had a solid movement.  Pt is not having the same pain as the other day, but is still uncomfortable.

## 2016-03-22 ENCOUNTER — Encounter: Payer: Medicare Other | Attending: Physical Medicine & Rehabilitation | Admitting: Registered Nurse

## 2016-03-22 ENCOUNTER — Encounter: Payer: Self-pay | Admitting: Registered Nurse

## 2016-03-22 VITALS — BP 119/73 | HR 89 | Resp 16

## 2016-03-22 DIAGNOSIS — G4730 Sleep apnea, unspecified: Secondary | ICD-10-CM | POA: Insufficient documentation

## 2016-03-22 DIAGNOSIS — Z79891 Long term (current) use of opiate analgesic: Secondary | ICD-10-CM | POA: Insufficient documentation

## 2016-03-22 DIAGNOSIS — Z833 Family history of diabetes mellitus: Secondary | ICD-10-CM | POA: Diagnosis not present

## 2016-03-22 DIAGNOSIS — H3320 Serous retinal detachment, unspecified eye: Secondary | ICD-10-CM | POA: Insufficient documentation

## 2016-03-22 DIAGNOSIS — M546 Pain in thoracic spine: Secondary | ICD-10-CM | POA: Diagnosis not present

## 2016-03-22 DIAGNOSIS — M545 Low back pain, unspecified: Secondary | ICD-10-CM

## 2016-03-22 DIAGNOSIS — G894 Chronic pain syndrome: Secondary | ICD-10-CM | POA: Diagnosis not present

## 2016-03-22 DIAGNOSIS — R202 Paresthesia of skin: Secondary | ICD-10-CM | POA: Diagnosis not present

## 2016-03-22 DIAGNOSIS — Z5181 Encounter for therapeutic drug level monitoring: Secondary | ICD-10-CM | POA: Diagnosis not present

## 2016-03-22 DIAGNOSIS — G9520 Unspecified cord compression: Secondary | ICD-10-CM

## 2016-03-22 DIAGNOSIS — Z992 Dependence on renal dialysis: Secondary | ICD-10-CM | POA: Insufficient documentation

## 2016-03-22 DIAGNOSIS — M1711 Unilateral primary osteoarthritis, right knee: Secondary | ICD-10-CM

## 2016-03-22 DIAGNOSIS — E119 Type 2 diabetes mellitus without complications: Secondary | ICD-10-CM | POA: Insufficient documentation

## 2016-03-22 DIAGNOSIS — I120 Hypertensive chronic kidney disease with stage 5 chronic kidney disease or end stage renal disease: Secondary | ICD-10-CM | POA: Insufficient documentation

## 2016-03-22 DIAGNOSIS — G128 Other spinal muscular atrophies and related syndromes: Secondary | ICD-10-CM | POA: Diagnosis not present

## 2016-03-22 DIAGNOSIS — M4644 Discitis, unspecified, thoracic region: Secondary | ICD-10-CM | POA: Diagnosis not present

## 2016-03-22 DIAGNOSIS — Z79899 Other long term (current) drug therapy: Secondary | ICD-10-CM | POA: Diagnosis not present

## 2016-03-22 DIAGNOSIS — Z794 Long term (current) use of insulin: Secondary | ICD-10-CM | POA: Insufficient documentation

## 2016-03-22 DIAGNOSIS — N186 End stage renal disease: Secondary | ICD-10-CM | POA: Insufficient documentation

## 2016-03-22 DIAGNOSIS — R200 Anesthesia of skin: Secondary | ICD-10-CM | POA: Diagnosis not present

## 2016-03-22 MED ORDER — OXYCODONE HCL 10 MG PO TABS: 10.0000 mg | ORAL_TABLET | Freq: Three times a day (TID) | ORAL | Status: DC | PRN

## 2016-03-22 NOTE — Progress Notes (Signed)
Subjective:    [name], male    DOB: 1966-01-29, 50 y.o.   MRN: [id number]  HPI: Seth [name] is a 50 year old male who returns for follow up appointment and medication refill. He states his pain is located in his mid- back mainly left side. He rates his pain 7. His current exercise regime is  attending [employer] twice a week and walking with his walker and cane.  He's on Hemodialysis  three days a week, Monday, Wednesday and Friday.  Past Medical History: Cord Compression Myelopathy Past Surgical History: T-1- T-7 Posterior Lateral Fusion with Pedicule screw instrumentation via Dr. Annette Stable.  Pain Inventory Average Pain 6 Pain Right Now 7 My pain is sharp, tingling and aching  In the last 24 hours, has pain interfered with the following? General activity 5 Relation with others 8 Enjoyment of life 8 What TIME of day is your pain at its worst? All Sleep (in general) Fair  Pain is worse with: walking, bending, standing and some activites Pain improves with: rest and medication Relief from Meds: 6  Mobility use a cane use a walker ability to climb steps?  no do you drive?  yes Do you have any goals in this area?  yes  Function Do you have any goals in this area?  no  Neuro/Psych No problems in this area  Prior Studies Any changes since last visit?  no  Physicians involved in your care Any changes since last visit?  no   Family History  Problem Relation Age of Onset  . Diabetes Mother   . Diabetes Sister   . Diabetes Brother    Social History   Social History  . Marital Status: Single    Spouse Name: N/A  . Number of Children: N/A  . Years of Education: N/A   Social History Main Topics  . Smoking status: Never Smoker   . Smokeless tobacco: Never Used  . Alcohol Use: No  . Drug Use: Yes    Special: Marijuana     Comment: ocassionaly   . Sexual Activity: Not Currently   Other Topics Concern  . 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  Divorce with 5 children, 3 boys and 2 girls.  Lives in [place].    Past Surgical History  Procedure Laterality Date  . Av fistula placement  05/03/2012    Procedure: ARTERIOVENOUS (AV) FISTULA CREATION;  Surgeon: Mal [name], MD;  Location: [place];  Service: Vascular;  Laterality: Right;  . Tracheostomy tube placement N/A 08/20/2013    Procedure: TRACHEOSTOMY Revision;  Surgeon: Melida Quitter, MD;  Location: [place];  Service: ENT;  Laterality: N/A;  . Tee without cardioversion N/A 07/10/2015    Procedure: TRANSESOPHAGEAL ECHOCARDIOGRAM (TEE);  Surgeon: Josue Hector, MD;  Location[facility] ENDOSCOPY;  Service: Cardiovascular;  Laterality: N/A;   Past Medical History  Diagnosis Date  . Hypertension   . ESRD (end stage renal disease) on dialysis ([place])   . 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 Sleep apnea     USES CPAP  . Diabetes mellitus     INSULIN DEPENDENT DIABETES  . Discitis 07/2015   BP 119/73 mmHg  Pulse 89  Resp 16  SpO2 96%  Opioid Risk Score:   Fall Risk Score:  `1  Depression screen PHQ 2/9  Depression screen [facility] 2/9 03/22/2016 01/13/2016 01/01/2016 11/03/2015 09/30/2015 09/15/2015 08/10/2015  Decreased Interest 0 0 1 1 0 0 0  Down, Depressed, Hopeless 0 0 1 1 0 0 0  PHQ - 2 Score 0 0 2 2 0 0 0  Altered sleeping - - - 3 - - -  Tired, decreased energy - - - 0 - - -  Change in appetite - - - 0 - - -  Feeling bad or failure about yourself  - - - 1 - - -  Trouble concentrating - - - 0 - - -  Moving slowly or fidgety/restless - - - 0 - - -  Suicidal thoughts - - - 0 - - -  PHQ-9 Score - - - 6 - - -  Difficult doing work/chores - - - Somewhat difficult - - -       Review of Systems  All other systems reviewed and are  negative.      Objective:   Physical Exam  Constitutional: He is oriented to person, place, and time. He appears well-developed and well-nourished.  HENT:  Head: Normocephalic and atraumatic.  Neck: Normal range of motion. Neck supple.  Cardiovascular: Normal rate and regular rhythm.   Pulmonary/Chest: Effort normal and breath sounds normal.  Musculoskeletal:  Normal Muscle Bulk and Muscle Testing Reveals: Upper Extremities: Full ROM and Muscle Strength 5/5 Right AVF + Bruit  Thoracic Paraspinal Tenderness: T-5- T-7 Mainly Left Side Lower Extremities: Full ROM and Muscle Strength 5/5 Arises from chair slowly    Neurological: He is alert and oriented to person, place, and time.  Skin: Skin is warm and dry.  Psychiatric: He has a normal mood and affect.  Nursing note and vitals reviewed.         Assessment & Plan:  1. 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 Pain Management: Refilled: Oxycodone 10 mg one tablet every 8 hours as needed #90. We will continue the opioid monitoring program, this consists of regular clinic visits, examinations, urine drug screen, pill counts as well as use of [place] Controlled Substance Reporting System.  Continue with Ice Therapy and to increase Activity as tolerated.  3. ESRD: On Hemodialysis: Nephrology Following   20 minutes of face to face patient care time was spent during this visit. All questions were encouraged and answered.  F/U in 1 month

## 2016-03-24 MED FILL — ATORVASTATIN 40 MG TABLET: 40 | 30 days supply | Qty: 30 | Fill #3

## 2016-04-13 ENCOUNTER — Encounter: Payer: Non-veteran care | Admitting: Internal Medicine

## 2016-04-21 ENCOUNTER — Encounter: Payer: Self-pay | Admitting: Registered Nurse

## 2016-04-21 ENCOUNTER — Encounter: Payer: Medicare Other | Attending: Physical Medicine & Rehabilitation | Admitting: Registered Nurse

## 2016-04-21 VITALS — BP 125/82 | HR 93 | Resp 14

## 2016-04-21 DIAGNOSIS — G128 Other spinal muscular atrophies and related syndromes: Secondary | ICD-10-CM | POA: Diagnosis not present

## 2016-04-21 DIAGNOSIS — Z992 Dependence on renal dialysis: Secondary | ICD-10-CM | POA: Insufficient documentation

## 2016-04-21 DIAGNOSIS — N186 End stage renal disease: Secondary | ICD-10-CM | POA: Diagnosis not present

## 2016-04-21 DIAGNOSIS — I120 Hypertensive chronic kidney disease with stage 5 chronic kidney disease or end stage renal disease: Secondary | ICD-10-CM | POA: Diagnosis not present

## 2016-04-21 DIAGNOSIS — Z794 Long term (current) use of insulin: Secondary | ICD-10-CM | POA: Insufficient documentation

## 2016-04-21 DIAGNOSIS — Z5181 Encounter for therapeutic drug level monitoring: Secondary | ICD-10-CM

## 2016-04-21 DIAGNOSIS — G894 Chronic pain syndrome: Secondary | ICD-10-CM

## 2016-04-21 DIAGNOSIS — M4644 Discitis, unspecified, thoracic region: Secondary | ICD-10-CM | POA: Insufficient documentation

## 2016-04-21 DIAGNOSIS — M1711 Unilateral primary osteoarthritis, right knee: Secondary | ICD-10-CM | POA: Diagnosis not present

## 2016-04-21 DIAGNOSIS — M546 Pain in thoracic spine: Secondary | ICD-10-CM

## 2016-04-21 DIAGNOSIS — G9520 Unspecified cord compression: Secondary | ICD-10-CM | POA: Diagnosis not present

## 2016-04-21 DIAGNOSIS — M545 Low back pain, unspecified: Secondary | ICD-10-CM

## 2016-04-21 DIAGNOSIS — G4730 Sleep apnea, unspecified: Secondary | ICD-10-CM | POA: Insufficient documentation

## 2016-04-21 DIAGNOSIS — Z79891 Long term (current) use of opiate analgesic: Secondary | ICD-10-CM | POA: Diagnosis not present

## 2016-04-21 DIAGNOSIS — R202 Paresthesia of skin: Secondary | ICD-10-CM | POA: Insufficient documentation

## 2016-04-21 DIAGNOSIS — R200 Anesthesia of skin: Secondary | ICD-10-CM | POA: Diagnosis not present

## 2016-04-21 DIAGNOSIS — H3320 Serous retinal detachment, unspecified eye: Secondary | ICD-10-CM | POA: Insufficient documentation

## 2016-04-21 DIAGNOSIS — Z833 Family history of diabetes mellitus: Secondary | ICD-10-CM | POA: Insufficient documentation

## 2016-04-21 DIAGNOSIS — E119 Type 2 diabetes mellitus without complications: Secondary | ICD-10-CM | POA: Insufficient documentation

## 2016-04-21 DIAGNOSIS — Z79899 Other long term (current) drug therapy: Secondary | ICD-10-CM

## 2016-04-21 MED ORDER — OXYCODONE HCL 10 MG PO TABS: 10.0000 mg | ORAL_TABLET | Freq: Three times a day (TID) | ORAL | Status: DC | PRN

## 2016-04-21 NOTE — Progress Notes (Signed)
Subjective:    [name], male    DOB: November 30, 1965, 50 y.o.   MRN: [id number]  HPI:Mr. Joseff Forseth is a 50 year old male who returns for follow up appointment and medication refill. He states his pain is located in his mid- back mainly left side. He rates his pain 6. His current exercise regime is attending [place] twice a week, walking on the treadmill for 7 minutes and walking with his walker and cane.  He's on Hemodialysis three days a week, Monday, Wednesday and Friday. Right AVF+ bruit Past Medical History: Cord Compression Myelopathy Past Surgical History: T-1- T-7 Posterior Lateral Fusion with Pedicule screw instrumentation via Dr. Annette Stable.   Pain Inventory Average Pain 6 Pain Right Now 6 My pain is tingling and aching  In the last 24 hours, has pain interfered with the following? General activity 5 Relation with others 7 Enjoyment of life 7 What TIME of day is your pain at its worst? all Sleep (in general) Fair  Pain is worse with: walking, bending, standing and some activites Pain improves with: medication Relief from Meds: 6  Mobility walk with assistance use a walker how many minutes can you walk? 5-7 ability to climb steps?  no do you drive?  no  Function disabled: date disabled .  Neuro/Psych bladder control problems numbness tingling trouble walking  Prior Studies Any changes since last visit?  no  Physicians involved in your care Any changes since last visit?  no   Family History  Problem Relation Age of Onset  . Diabetes Mother   . Diabetes Sister   . Diabetes Brother    Social History   Social History  . Marital Status: Single    Spouse Name: N/A  . Number of Children: N/A  . Years of Education: N/A   Social History Main Topics  . Smoking status: Never Smoker   . Smokeless tobacco: Never Used  . Alcohol Use: No  . Drug Use: Yes    Special: Marijuana     Comment: ocassionaly   . Sexual  Activity: Not Currently   Other Topics Concern  . 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  Divorce with 5 children, 3 boys and 2 girls.  Lives in [place].    Past Surgical History  Procedure Laterality Date  . Av fistula placement  05/03/2012    Procedure: ARTERIOVENOUS (AV) FISTULA CREATION;  Surgeon: Mal [name], MD;  Location: [place];  Service: Vascular;  Laterality: Right;  . Tracheostomy tube placement N/A 08/20/2013    Procedure: TRACHEOSTOMY Revision;  Surgeon: Melida Quitter, MD;  Location: [place];  Service: ENT;  Laterality: N/A;  . Tee without cardioversion N/A 07/10/2015    Procedure: TRANSESOPHAGEAL ECHOCARDIOGRAM (TEE);  Surgeon: Josue Hector, MD;  Location: [facility] ENDOSCOPY;  Service: Cardiovascular;  Laterality: N/A;   Past Medical History  Diagnosis Date  . Hypertension   . ESRD (end stage renal disease) on dialysis ([place])   . 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 Sleep apnea     USES CPAP  . Diabetes mellitus     INSULIN DEPENDENT DIABETES  . Discitis 07/2015   BP 125/82 mmHg  Pulse 93  Resp 14  SpO2 92%  Opioid Risk Score:   Fall Risk Score:  `1  Depression screen PHQ 2/9  Depression screen [facility] 2/9 03/22/2016 01/13/2016 01/01/2016 11/03/2015 09/30/2015 09/15/2015 08/10/2015  Decreased Interest 0 0 1 1 0 0 0  Down, Depressed, Hopeless 0 0 1 1 0 0 0  PHQ - 2 Score 0 0 2 2 0 0 0  Altered sleeping - - - 3 - - -  Tired, decreased energy - - - 0 - - -  Change in appetite - - - 0 - - -  Feeling bad or failure about yourself  - - - 1 - - -  Trouble concentrating - - - 0 - - -  Moving slowly or fidgety/restless - - - 0 - - -  Suicidal thoughts - - - 0 - - -  PHQ-9 Score - - - 6 - - -  Difficult doing work/chores - - - Somewhat difficult - - -      Review of Systems  Constitutional: Negative.   HENT: Negative.   Eyes: Negative.   Respiratory: Positive for cough.   Endocrine: Negative.   Genitourinary: Positive for difficulty urinating.  Musculoskeletal: Positive for back pain and gait problem.  Skin: Negative.   Allergic/Immunologic: Negative.   Neurological:       Tingling   Psychiatric/Behavioral: Negative.   All other systems reviewed and are negative.      Objective:   Physical Exam  Constitutional: He is oriented to person, place, and time. He appears well-developed and well-nourished.  HENT:  Head: Normocephalic and atraumatic.  Neck: Normal range of motion. Neck supple.  Cardiovascular: Normal rate and regular rhythm.   Pulmonary/Chest: Effort normal and breath sounds normal.  Musculoskeletal:  Normal Muscle Bulk and Muscle Testing Reveals: Upper Extremities: Full ROM and Muscle Strength 5/5 Thoracic Paraspinal Tenderness: T-1- T-6 Mainly Left Side Lower Extremities: Full ROM and Muscle Strength 5/5 Arises from Table with ease using walker for support Narrow Based Gait   Neurological: He is alert and oriented to person, place, and time.  Skin: Skin is warm and dry.  Psychiatric: He has a normal mood and affect.          Assessment & Plan:  1. 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 Pain Management: Refilled: Oxycodone 10 mg one tablet every 8 hours as needed #90. We will continue the opioid monitoring program, this consists of regular clinic visits, examinations, urine drug screen, pill counts as well as use of [place] Controlled Substance Reporting System.  Continue with Ice Therapy and to increase Activity as tolerated.  3. ESRD: On Hemodialysis: Nephrology Following   20 minutes of face to face patient care time was spent during this visit. All questions were  encouraged and answered.  F/U in 1 month

## 2016-04-26 ENCOUNTER — Ambulatory Visit: Payer: Non-veteran care | Admitting: Physical Medicine & Rehabilitation

## 2016-05-04 ENCOUNTER — Encounter: Payer: Self-pay | Admitting: Internal Medicine

## 2016-05-04 ENCOUNTER — Encounter: Payer: Medicare Other | Admitting: Internal Medicine

## 2016-05-13 MED FILL — ATORVASTATIN 40 MG TABLET: 40 | 30 days supply | Qty: 30 | Fill #4

## 2016-05-13 MED FILL — AMLODIPINE BESYLATE 10 MG T: 10 | 30 days supply | Qty: 60 | Fill #2

## 2016-05-24 ENCOUNTER — Encounter: Payer: Medicare Other | Admitting: Physical Medicine & Rehabilitation

## 2016-06-07 ENCOUNTER — Encounter: Payer: Self-pay | Admitting: Physical Medicine & Rehabilitation

## 2016-06-07 ENCOUNTER — Encounter: Payer: Medicare Other | Attending: Physical Medicine & Rehabilitation | Admitting: Physical Medicine & Rehabilitation

## 2016-06-07 DIAGNOSIS — R202 Paresthesia of skin: Secondary | ICD-10-CM | POA: Diagnosis not present

## 2016-06-07 DIAGNOSIS — G128 Other spinal muscular atrophies and related syndromes: Secondary | ICD-10-CM | POA: Insufficient documentation

## 2016-06-07 DIAGNOSIS — I120 Hypertensive chronic kidney disease with stage 5 chronic kidney disease or end stage renal disease: Secondary | ICD-10-CM | POA: Diagnosis not present

## 2016-06-07 DIAGNOSIS — Z794 Long term (current) use of insulin: Secondary | ICD-10-CM | POA: Diagnosis not present

## 2016-06-07 DIAGNOSIS — E119 Type 2 diabetes mellitus without complications: Secondary | ICD-10-CM | POA: Insufficient documentation

## 2016-06-07 DIAGNOSIS — Z79891 Long term (current) use of opiate analgesic: Secondary | ICD-10-CM | POA: Diagnosis not present

## 2016-06-07 DIAGNOSIS — M1711 Unilateral primary osteoarthritis, right knee: Secondary | ICD-10-CM

## 2016-06-07 DIAGNOSIS — H3320 Serous retinal detachment, unspecified eye: Secondary | ICD-10-CM | POA: Insufficient documentation

## 2016-06-07 DIAGNOSIS — R200 Anesthesia of skin: Secondary | ICD-10-CM | POA: Insufficient documentation

## 2016-06-07 DIAGNOSIS — N186 End stage renal disease: Secondary | ICD-10-CM | POA: Diagnosis not present

## 2016-06-07 DIAGNOSIS — G9520 Unspecified cord compression: Secondary | ICD-10-CM

## 2016-06-07 DIAGNOSIS — Z833 Family history of diabetes mellitus: Secondary | ICD-10-CM | POA: Diagnosis not present

## 2016-06-07 DIAGNOSIS — M545 Low back pain, unspecified: Secondary | ICD-10-CM

## 2016-06-07 DIAGNOSIS — Z992 Dependence on renal dialysis: Secondary | ICD-10-CM | POA: Diagnosis not present

## 2016-06-07 DIAGNOSIS — M4644 Discitis, unspecified, thoracic region: Secondary | ICD-10-CM | POA: Diagnosis not present

## 2016-06-07 DIAGNOSIS — M546 Pain in thoracic spine: Secondary | ICD-10-CM | POA: Diagnosis not present

## 2016-06-07 DIAGNOSIS — G4730 Sleep apnea, unspecified: Secondary | ICD-10-CM | POA: Diagnosis not present

## 2016-06-07 MED ORDER — OXYCODONE HCL 10 MG PO TABS: 10.0000 mg | ORAL_TABLET | Freq: Three times a day (TID) | ORAL | 0 refills | Status: DC | PRN

## 2016-06-07 NOTE — Progress Notes (Signed)
Subjective:    [name], male    DOB: 11/04/1965, 50 y.o.   MRN: [id number]  HPI   Seth [name] is here in follow up of his thoracic myelopathy and associated pain/functional. He is now on a cane. He goes to the gym and stays active with his kids. He wants to get a new kidney and has been working to lose weight by being more active and eating better. Marland Kitchen His meds are effective. His bowels are regulated.   For pain he is using oxycodone IR which has remained effective.    Pain Inventory Average Pain 7 Pain Right Now 7 My pain is sharp, tingling and aching  In the last 24 hours, has pain interfered with the following? General activity 5 Relation with others 7 Enjoyment of life 7 What TIME of day is your pain at its worst? all Sleep (in general) NA  Pain is worse with: walking, bending, standing and some activites Pain improves with: rest and medication Relief from Meds: not answered--says he has taken more than 3/day on some days  Mobility use a cane use a walker ability to climb steps?  yes do you drive?  yes  Function Do you have any goals in this area?  no  Neuro/Psych trouble walking  Prior Studies Any changes since last visit?  no  Physicians involved in your care Any changes since last visit?  no   Family History  Problem Relation Age of Onset  . Diabetes Mother   . Diabetes Sister   . Diabetes Brother    Social History   Social History  . Marital status: Single    Spouse name: N/A  . Number of children: N/A  . Years of education: N/A   Social History Main Topics  . Smoking status: Never Smoker  . Smokeless tobacco: Never Used  . Alcohol use No  . Drug use:     Types: Marijuana     Comment: ocassionaly   . Sexual activity: Not Currently   Other Topics Concern  . 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  Divorce with 5 children, 3 boys and 2 girls.  Lives in [place].    Past Surgical History:  Procedure Laterality Date  . AV FISTULA PLACEMENT  05/03/2012   Procedure: ARTERIOVENOUS (AV) FISTULA CREATION;  Surgeon: Mal [name], MD;  Location: [place];  Service: Vascular;  Laterality: Right;  . TEE WITHOUT CARDIOVERSION N/A 07/10/2015   Procedure: TRANSESOPHAGEAL ECHOCARDIOGRAM (TEE);  Surgeon: Josue Hector, MD;  Location: [place];  Service: Cardiovascular;  Laterality: N/A;  . TRACHEOSTOMY TUBE PLACEMENT N/A 08/20/2013   Procedure: TRACHEOSTOMY Revision;  Surgeon: Melida Quitter, MD;  Location: [place];  Service: ENT;  Laterality: N/A;   Past Medical History:  Diagnosis Date  . Diabetes mellitus    INSULIN DEPENDENT DIABETES  . 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 ESRD (end stage renal disease) on dialysis ([place])   . Hypertension   . 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 Sleep apnea    USES CPAP   BP 112/75 (BP Location: Left Arm, Patient Position: Sitting, Cuff Size: Large)   Pulse 88   Resp 16   SpO2 95%   Opioid Risk Score:  Fall Risk Score:  `1  Depression screen PHQ 2/9  Depression screen [facility] 2/9 06/07/2016 03/22/2016 01/13/2016 01/01/2016 11/03/2015 09/30/2015 09/15/2015  Decreased Interest 0 0 0 1 1 0 0  Down, Depressed, Hopeless 0 0 0 1 1 0 0  PHQ - 2 Score 0 0 0 2 2 0 0  Altered sleeping - - - - 3 - -  Tired, decreased energy - - - - 0 - -  Change in appetite - - - - 0 - -  Feeling bad or failure about yourself  - - - - 1 - -  Trouble concentrating - - - - 0 - -  Moving slowly or fidgety/restless - - - - 0 - -  Suicidal thoughts - - - - 0 - -  PHQ-9 Score - - - - 6 - -  Difficult doing work/chores - - - - Somewhat difficult - -    Review of Systems  All other systems reviewed and are negative.      Objective:   Physical Exam  Constitutional: He is oriented to person, place, and  time. He appears well-developed and well-nourished.  Morbidly obese male--has lost some weight HENT:  Head: Normocephalic and atraumatic.  Eyes: Conjunctivae and EOM are normal. Pupils are equal, round, and reactive to light.  Neck: Normal range of motion. Neck supple.   Cardiovascular: Normal rate and regular rhythm. systolic murmur Respiratory: Effort normal and breath sounds normal. No respiratory distress. He has no wheezes.  GI: Soft. Bowel sounds are normal. He exhibits no distension. There is no tenderness.   Musculoskeletal: standing posture fair. Pain with resisted flexion of knees but full rom in both knees.antalgia with gait. Sl wide based Neuro: Strength B/l UE 5/5 throughout LLE hip flexion %/5, ankle dorsi/plantar flexion 5/5 RLE: hip flexion 5/5, ankle dorsi/plantar flexion 5/5 He is alert and oriented to person, place, and time.  Speech clear.  Skin: Skin is warm and dry.    Psychiatric: He has a normal mood and affect. His speech is normal. Thought content normal. Cognition and memory are normal.      Assessment & Plan:  Medical Problem List and Plan: 1. 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 Pain Management:              -oxycodone 10mg ,   90 pills per month, q8 prn  -ice prn -activity tolerance improving -encouraged weight loss  3. ESRD:HD per nephrology  4. HTN: per primary 5.. DM type 2: SSI for elevated BS.  - amaryl increased to 2mg  daily with good results--continue at home                       -further weight loss discussed   7. OSA: Continue use of CPAP.   Fifteen minutes of face to face patient care time were spent during this visit. All questions were encouraged and answered. Follow up with me or NP in about two  months.

## 2016-06-07 NOTE — Patient Instructions (Signed)
PLEASE CALL ME WITH ANY PROBLEMS OR QUESTIONS ([phone])  

## 2016-07-12 MED FILL — ATORVASTATIN 40 MG TABLET: 40 | 30 days supply | Qty: 30 | Fill #5

## 2016-08-04 ENCOUNTER — Ambulatory Visit: Payer: Medicare Other | Admitting: Registered Nurse

## 2016-08-11 ENCOUNTER — Encounter (INDEPENDENT_AMBULATORY_CARE_PROVIDER_SITE_OTHER): Payer: Self-pay

## 2016-08-11 ENCOUNTER — Encounter: Payer: Self-pay | Admitting: Registered Nurse

## 2016-08-11 ENCOUNTER — Encounter: Payer: Medicare Other | Attending: Physical Medicine & Rehabilitation | Admitting: Registered Nurse

## 2016-08-11 VITALS — BP 111/62 | HR 89 | Resp 14

## 2016-08-11 DIAGNOSIS — E119 Type 2 diabetes mellitus without complications: Secondary | ICD-10-CM | POA: Diagnosis not present

## 2016-08-11 DIAGNOSIS — Z992 Dependence on renal dialysis: Secondary | ICD-10-CM | POA: Insufficient documentation

## 2016-08-11 DIAGNOSIS — G128 Other spinal muscular atrophies and related syndromes: Secondary | ICD-10-CM | POA: Insufficient documentation

## 2016-08-11 DIAGNOSIS — Z794 Long term (current) use of insulin: Secondary | ICD-10-CM | POA: Diagnosis not present

## 2016-08-11 DIAGNOSIS — N186 End stage renal disease: Secondary | ICD-10-CM | POA: Diagnosis not present

## 2016-08-11 DIAGNOSIS — M4644 Discitis, unspecified, thoracic region: Secondary | ICD-10-CM | POA: Insufficient documentation

## 2016-08-11 DIAGNOSIS — Z5181 Encounter for therapeutic drug level monitoring: Secondary | ICD-10-CM

## 2016-08-11 DIAGNOSIS — Z833 Family history of diabetes mellitus: Secondary | ICD-10-CM | POA: Diagnosis not present

## 2016-08-11 DIAGNOSIS — M170 Bilateral primary osteoarthritis of knee: Secondary | ICD-10-CM

## 2016-08-11 DIAGNOSIS — R202 Paresthesia of skin: Secondary | ICD-10-CM | POA: Insufficient documentation

## 2016-08-11 DIAGNOSIS — I120 Hypertensive chronic kidney disease with stage 5 chronic kidney disease or end stage renal disease: Secondary | ICD-10-CM | POA: Diagnosis not present

## 2016-08-11 DIAGNOSIS — H3320 Serous retinal detachment, unspecified eye: Secondary | ICD-10-CM | POA: Insufficient documentation

## 2016-08-11 DIAGNOSIS — M546 Pain in thoracic spine: Secondary | ICD-10-CM | POA: Diagnosis not present

## 2016-08-11 DIAGNOSIS — M545 Low back pain, unspecified: Secondary | ICD-10-CM

## 2016-08-11 DIAGNOSIS — M1711 Unilateral primary osteoarthritis, right knee: Secondary | ICD-10-CM

## 2016-08-11 DIAGNOSIS — G9520 Unspecified cord compression: Secondary | ICD-10-CM | POA: Diagnosis not present

## 2016-08-11 DIAGNOSIS — R200 Anesthesia of skin: Secondary | ICD-10-CM | POA: Insufficient documentation

## 2016-08-11 DIAGNOSIS — G4730 Sleep apnea, unspecified: Secondary | ICD-10-CM | POA: Insufficient documentation

## 2016-08-11 DIAGNOSIS — Z79891 Long term (current) use of opiate analgesic: Secondary | ICD-10-CM | POA: Diagnosis not present

## 2016-08-11 DIAGNOSIS — G8929 Other chronic pain: Secondary | ICD-10-CM

## 2016-08-11 DIAGNOSIS — Z79899 Other long term (current) drug therapy: Secondary | ICD-10-CM

## 2016-08-11 DIAGNOSIS — G894 Chronic pain syndrome: Secondary | ICD-10-CM

## 2016-08-11 MED ORDER — OXYCODONE HCL 10 MG PO TABS: 10.0000 mg | ORAL_TABLET | Freq: Three times a day (TID) | ORAL | 0 refills | Status: DC | PRN

## 2016-08-11 NOTE — Progress Notes (Signed)
Subjective:    [name], male    DOB: [birth year], 50 y.o.   MRN: [id number]  HPI: [name] is a 50 year old male who returns for follow up appointment and medication refill. He states his pain is located in his mid- back mainly left side. He rates his pain 7. His current exercise regime is attending [place] twice a week, walking on the treadmill and performing stretching exercises and walking with  cane.  He's on Hemodialysis three days a week, Monday, Wednesday and Friday. Right AVF+ bruit Past Medical History: Cord Compression Myelopathy Past Surgical History: T-1- T-7 Posterior Lateral Fusion with Pedicule screw instrumentation via Dr. Annette Stable.  Pain Inventory Average Pain 7 Pain Right Now 7 My pain is tingling and aching  In the last 24 hours, has pain interfered with the following? General activity 6 Relation with others 9 Enjoyment of life 9 What TIME of day is your pain at its worst? all Sleep (in general) Fair  Pain is worse with: walking, bending, standing and some activites Pain improves with: rest and medication Relief from Meds: n/a  Mobility use a cane ability to climb steps?  no do you drive?  yes  Function what is your job? n/a  Neuro/Psych No problems in this area  Prior Studies Any changes since last visit?  no  Physicians involved in your care Any changes since last visit?  no   Family History  Problem Relation Age of Onset  . Diabetes Mother   . Diabetes Sister   . Diabetes Brother    Social History   Social History  . Marital status: Single    Spouse name: N/A  . Number of children: N/A  . Years of education: N/A   Social History Main Topics  . Smoking status: Never Smoker  . Smokeless tobacco: Never Used  . Alcohol use No  . Drug use:     Types: Marijuana     Comment: ocassionaly   . Sexual activity: Not Currently   Other Topics Concern  . 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  Divorce with 5 children, 3 boys and 2 girls.  Lives in [place].    Past Surgical History:  Procedure Laterality Date  . AV FISTULA PLACEMENT  05/03/2012   Procedure: ARTERIOVENOUS (AV) FISTULA CREATION;  Surgeon: Mal [name], MD;  Location: [place];  Service: Vascular;  Laterality: Right;  . TEE WITHOUT CARDIOVERSION N/A 07/10/2015   Procedure: TRANSESOPHAGEAL ECHOCARDIOGRAM (TEE);  Surgeon: Josue Hector, MD;  Location: [place];  Service: Cardiovascular;  Laterality: N/A;  . TRACHEOSTOMY TUBE PLACEMENT N/A 08/20/2013   Procedure: TRACHEOSTOMY Revision;  Surgeon: Melida Quitter, MD;  Location: [place];  Service: ENT;  Laterality: N/A;   Past Medical History:  Diagnosis Date  . Diabetes mellitus    INSULIN DEPENDENT DIABETES  . 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 ESRD (end stage renal disease) on dialysis ([place])   . Hypertension   . 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 Sleep apnea    USES CPAP   BP 111/62   Pulse 89   Resp 14   SpO2 94%   Opioid Risk Score:   Fall Risk Score:  `1  Depression  screen PHQ 2/9  Depression screen [facility] 2/9 06/07/2016 03/22/2016 01/13/2016 01/01/2016 11/03/2015 09/30/2015 09/15/2015  Decreased Interest 0 0 0 1 1 0 0  Down, Depressed, Hopeless 0 0 0 1 1 0 0  PHQ - 2 Score 0 0 0 2 2 0 0  Altered sleeping - - - - 3 - -  Tired, decreased energy - - - - 0 - -  Change in appetite - - - - 0 - -  Feeling bad or failure about yourself  - - - - 1 - -  Trouble concentrating - - - - 0 - -  Moving slowly or fidgety/restless - - - - 0 - -  Suicidal thoughts - - - - 0 - -  PHQ-9 Score - - - - 6 - -  Difficult doing work/chores - - - - Somewhat difficult - -     Review of Systems  All other systems reviewed and are negative.      Objective:   Physical  Exam  Constitutional: He is oriented to person, place, and time. He appears well-developed and well-nourished.  HENT:  Head: Normocephalic and atraumatic.  Neck: Normal range of motion. Neck supple.  Cardiovascular: Normal rate and regular rhythm.   Pulmonary/Chest: Effort normal and breath sounds normal.  Musculoskeletal:  Normal Muscle Bulk and Muscle Testing Reveals: Upper Extremities: Full ROM and Muscle Strength 5/5 Thoracic Paraspinal Tenderness: T-3- T-5 Lower Extremities: Full ROM and Muscle Strength 5/5 Arises from Table slowly using straight cane for support Narrow Based Gait  Neurological: He is alert and oriented to person, place, and time.  Skin: Skin is warm and dry.  Psychiatric: He has a normal mood and affect.  Nursing note and vitals reviewed.         Assessment & Plan:  1. 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 Pain Management: Refilled: Oxycodone 10 mg one tablet every 8 hours as needed #90. Second script given for the following month. We will continue the opioid monitoring program, this consists of regular clinic visits, examinations, urine drug screen, pill counts as well as use of [place] Controlled Substance Reporting System.  Continue with Ice Therapy and to increase Activity as tolerated.  3. ESRD: On Hemodialysis: Nephrology Following   20 minutes of face to face patient care time was spent during this visit. All questions were encouraged and answered.  F/U in 2 months

## 2016-09-02 MED FILL — AMLODIPINE BESYLATE 10 MG T: 10 | 30 days supply | Qty: 60 | Fill #3

## 2016-09-02 MED FILL — ATORVASTATIN 40 MG TABLET: 40 | 30 days supply | Qty: 30 | Fill #6

## 2016-10-11 ENCOUNTER — Encounter: Payer: Self-pay | Admitting: Registered Nurse

## 2016-10-11 ENCOUNTER — Encounter: Payer: Medicare Other | Attending: Physical Medicine & Rehabilitation | Admitting: Registered Nurse

## 2016-10-11 VITALS — BP 124/81 | HR 85 | Resp 14

## 2016-10-11 DIAGNOSIS — G9520 Unspecified cord compression: Secondary | ICD-10-CM

## 2016-10-11 DIAGNOSIS — R202 Paresthesia of skin: Secondary | ICD-10-CM | POA: Insufficient documentation

## 2016-10-11 DIAGNOSIS — M545 Low back pain, unspecified: Secondary | ICD-10-CM

## 2016-10-11 DIAGNOSIS — R200 Anesthesia of skin: Secondary | ICD-10-CM | POA: Insufficient documentation

## 2016-10-11 DIAGNOSIS — Z79891 Long term (current) use of opiate analgesic: Secondary | ICD-10-CM | POA: Insufficient documentation

## 2016-10-11 DIAGNOSIS — Z992 Dependence on renal dialysis: Secondary | ICD-10-CM | POA: Diagnosis not present

## 2016-10-11 DIAGNOSIS — G128 Other spinal muscular atrophies and related syndromes: Secondary | ICD-10-CM | POA: Diagnosis not present

## 2016-10-11 DIAGNOSIS — Z833 Family history of diabetes mellitus: Secondary | ICD-10-CM | POA: Insufficient documentation

## 2016-10-11 DIAGNOSIS — M1711 Unilateral primary osteoarthritis, right knee: Secondary | ICD-10-CM

## 2016-10-11 DIAGNOSIS — H3320 Serous retinal detachment, unspecified eye: Secondary | ICD-10-CM | POA: Diagnosis not present

## 2016-10-11 DIAGNOSIS — N186 End stage renal disease: Secondary | ICD-10-CM | POA: Insufficient documentation

## 2016-10-11 DIAGNOSIS — Z5181 Encounter for therapeutic drug level monitoring: Secondary | ICD-10-CM

## 2016-10-11 DIAGNOSIS — Z794 Long term (current) use of insulin: Secondary | ICD-10-CM | POA: Diagnosis not present

## 2016-10-11 DIAGNOSIS — E119 Type 2 diabetes mellitus without complications: Secondary | ICD-10-CM | POA: Insufficient documentation

## 2016-10-11 DIAGNOSIS — I120 Hypertensive chronic kidney disease with stage 5 chronic kidney disease or end stage renal disease: Secondary | ICD-10-CM | POA: Insufficient documentation

## 2016-10-11 DIAGNOSIS — Z79899 Other long term (current) drug therapy: Secondary | ICD-10-CM

## 2016-10-11 DIAGNOSIS — G8929 Other chronic pain: Secondary | ICD-10-CM

## 2016-10-11 DIAGNOSIS — M546 Pain in thoracic spine: Secondary | ICD-10-CM

## 2016-10-11 DIAGNOSIS — G894 Chronic pain syndrome: Secondary | ICD-10-CM

## 2016-10-11 DIAGNOSIS — G4730 Sleep apnea, unspecified: Secondary | ICD-10-CM | POA: Insufficient documentation

## 2016-10-11 DIAGNOSIS — M4644 Discitis, unspecified, thoracic region: Secondary | ICD-10-CM | POA: Insufficient documentation

## 2016-10-11 MED ORDER — OXYCODONE HCL 10 MG PO TABS: 10.0000 mg | ORAL_TABLET | Freq: Three times a day (TID) | ORAL | 0 refills | Status: DC | PRN

## 2016-10-11 NOTE — [facility] Notes (Signed)
Subjective:    [name], male    DOB: 12-Feb-1966, 51 y.o.   MRN: [id number]  HPI: [name] is a 51 year old male who returns for follow up appointment and medication refill. He states his pain is located in his mid- back mainly left side. He rates his pain 8. His current exercise regime is performing stretching exercises and walking with his cane.  He's on Hemodialysis three days a week, Monday, Wednesday and Friday.  Past Medical History: Cord Compression Myelopathy Past Surgical History: T-1- T-7 Posterior Lateral Fusion with Pedicule screw instrumentation via Dr. Annette Stable.   Pain Inventory Average Pain 7 Pain Right Now 8 My pain is sharp, dull, tingling and aching  In the last 24 hours, has pain interfered with the following? General activity 6 Relation with others 10 Enjoyment of life 9 What TIME of day is your pain at its worst? all Sleep (in general) Fair  Pain is worse with: walking, bending and standing Pain improves with: rest and medication Relief from Meds: 8  Mobility walk with assistance use a cane ability to climb steps?  no do you drive?  yes  Function disabled: date disabled .  Neuro/Psych trouble walking  Prior Studies Any changes since last visit?  no  Physicians involved in your care Any changes since last visit?  no   Family History  Problem Relation Age of Onset  . Diabetes Mother   . Diabetes Sister   . Diabetes Brother    Social History   Social History  . Marital status: Single    Spouse name: N/A  . Number of children: N/A  . Years of education: N/A   Social History Main Topics  . Smoking status: Never Smoker  . Smokeless tobacco: Never Used  . Alcohol use No  . Drug use:     Types: Marijuana     Comment: ocassionaly   . Sexual activity: Not Currently   Other Topics Concern  . 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  Divorce with 5 children, 3 boys and 2 girls.  Lives in [place].    Past Surgical History:  Procedure Laterality Date  . AV FISTULA PLACEMENT  05/03/2012   Procedure: ARTERIOVENOUS (AV) FISTULA CREATION;  Surgeon: Mal [name], MD;  Location: [facility];  Service: Vascular;  Laterality: Right;  . TEE WITHOUT CARDIOVERSION N/A 07/10/2015   Procedure: TRANSESOPHAGEAL ECHOCARDIOGRAM (TEE);  Surgeon: Josue Hector, MD;  Location: [facility];  Service: Cardiovascular;  Laterality: N/A;  . TRACHEOSTOMY TUBE PLACEMENT N/A 08/20/2013   Procedure: TRACHEOSTOMY Revision;  Surgeon: Melida Quitter, MD;  Location: [facility];  Service: ENT;  Laterality: N/A;   Past Medical History:  Diagnosis Date  . Diabetes mellitus    INSULIN DEPENDENT DIABETES  . 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 ESRD (end stage renal disease) on dialysis ([place])   . Hypertension   . 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 Sleep apnea    USES CPAP   BP 124/81   Pulse 85   Resp 14   SpO2 96%   Opioid Risk Score:   Fall Risk Score:  `1  Depression screen [facility] 2/9  Depression screen [facility] 2/9 06/07/2016 03/22/2016 01/13/2016 01/01/2016 11/03/2015  09/30/2015 09/15/2015  Decreased Interest 0 0 0 1 1 0 0  Down, Depressed, Hopeless 0 0 0 1 1 0 0  PHQ - 2 Score 0 0 0 2 2 0 0  Altered sleeping - - - - 3 - -  Tired, decreased energy - - - - 0 - -  Change in appetite - - - - 0 - -  Feeling bad or failure about yourself  - - - - 1 - -  Trouble concentrating - - - - 0 - -  Moving slowly or fidgety/restless - - - - 0 - -  Suicidal thoughts - - - - 0 - -  PHQ-9 Score - - - - 6 - -  Difficult doing work/chores - - - - Somewhat difficult - -    Review of Systems  Constitutional: Negative.   HENT: Negative.   Eyes: Negative.   Respiratory: Negative.   Cardiovascular: Negative.   Gastrointestinal: Negative.     Endocrine: Negative.   Genitourinary: Negative.   Musculoskeletal: Positive for back pain and gait problem.  Allergic/Immunologic: Negative.   Hematological: Negative.   Psychiatric/Behavioral: Negative.   All other systems reviewed and are negative.      Objective:   Physical Exam  Constitutional: He is oriented to person, place, and time. He appears well-developed and well-nourished.  HENT:  Head: Normocephalic and atraumatic.  Neck: Normal range of motion. Neck supple.  Cardiovascular: Normal rate and regular rhythm.   Pulmonary/Chest: Effort normal and breath sounds normal.  Musculoskeletal:  Normal Muscle Bulk and Muscle Testing Reveals: Upper Extremities: Full ROM and Muscle Strength 5/5 Thoracic Paraspinal Tenderness: T-3- T-7 Lower Extremities: Full ROM and Muscle Strength 5/5 Arises from Table slowly using 3 prong cane for support Narrow Based Gait  Neurological: He is alert and oriented to person, place, and time.  Skin: Skin is warm and dry.  Psychiatric: He has a normal mood and affect.  Nursing note and vitals reviewed.         Assessment & Plan:  1. 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 Pain Management: Refilled: Oxycodone 10 mg one tablet every 8 hours as needed #90.  We will continue the opioid monitoring program, this consists of regular clinic visits, examinations, urine drug screen, pill counts as well as use of [place] Controlled Substance Reporting System.  Continue with Ice Therapy and to increase Activity as tolerated.  3. ESRD: On Hemodialysis: Nephrology Following   20 minutes of face to face patient care time was spent during this visit. All questions were encouraged and answered.  F/U in 1 month

## 2016-10-25 ENCOUNTER — Other Ambulatory Visit: Payer: Self-pay | Admitting: *Deleted

## 2016-10-25 DIAGNOSIS — I10 Essential (primary) hypertension: Secondary | ICD-10-CM

## 2016-10-25 DIAGNOSIS — N186 End stage renal disease: Principal | ICD-10-CM

## 2016-10-25 DIAGNOSIS — E1122 Type 2 diabetes mellitus with diabetic chronic kidney disease: Secondary | ICD-10-CM

## 2016-10-25 DIAGNOSIS — Z992 Dependence on renal dialysis: Principal | ICD-10-CM

## 2016-10-26 MED ORDER — ATORVASTATIN CALCIUM 40 MG PO TABS: 40.0000 mg | ORAL_TABLET | Freq: Every day | ORAL | 11 refills | Status: DC

## 2016-10-26 MED FILL — ATORVASTATIN 40 MG TABLET: 40 | 30 days supply | Qty: 30 | Fill #0

## 2016-11-07 ENCOUNTER — Telehealth: Payer: Self-pay

## 2016-11-07 ENCOUNTER — Encounter: Payer: Self-pay | Admitting: Internal Medicine

## 2016-11-07 NOTE — Telephone Encounter (Signed)
On November 07, 2016 [place] was reviewed. No conflict was seen on the [place] with multiple prescribers. Kobi Mario has signed a Narcotic Contract with our office. If there are any discrepancies this will be reported to his Physician.

## 2016-11-08 ENCOUNTER — Encounter (HOSPITAL_BASED_OUTPATIENT_CLINIC_OR_DEPARTMENT_OTHER): Payer: Medicare Other | Admitting: Registered Nurse

## 2016-11-08 ENCOUNTER — Telehealth: Payer: Self-pay | Admitting: Registered Nurse

## 2016-11-08 ENCOUNTER — Encounter: Payer: Self-pay | Admitting: Registered Nurse

## 2016-11-08 VITALS — BP 129/83 | HR 85 | Resp 16

## 2016-11-08 DIAGNOSIS — M545 Low back pain, unspecified: Secondary | ICD-10-CM

## 2016-11-08 DIAGNOSIS — G894 Chronic pain syndrome: Secondary | ICD-10-CM

## 2016-11-08 DIAGNOSIS — R202 Paresthesia of skin: Secondary | ICD-10-CM | POA: Diagnosis not present

## 2016-11-08 DIAGNOSIS — G9520 Unspecified cord compression: Secondary | ICD-10-CM

## 2016-11-08 DIAGNOSIS — G8929 Other chronic pain: Secondary | ICD-10-CM

## 2016-11-08 DIAGNOSIS — M4644 Discitis, unspecified, thoracic region: Secondary | ICD-10-CM | POA: Diagnosis not present

## 2016-11-08 DIAGNOSIS — I120 Hypertensive chronic kidney disease with stage 5 chronic kidney disease or end stage renal disease: Secondary | ICD-10-CM | POA: Diagnosis not present

## 2016-11-08 DIAGNOSIS — Z79899 Other long term (current) drug therapy: Secondary | ICD-10-CM

## 2016-11-08 DIAGNOSIS — R200 Anesthesia of skin: Secondary | ICD-10-CM | POA: Diagnosis not present

## 2016-11-08 DIAGNOSIS — M546 Pain in thoracic spine: Secondary | ICD-10-CM | POA: Diagnosis not present

## 2016-11-08 DIAGNOSIS — Z5181 Encounter for therapeutic drug level monitoring: Secondary | ICD-10-CM

## 2016-11-08 DIAGNOSIS — G128 Other spinal muscular atrophies and related syndromes: Secondary | ICD-10-CM | POA: Diagnosis not present

## 2016-11-08 DIAGNOSIS — M1711 Unilateral primary osteoarthritis, right knee: Secondary | ICD-10-CM

## 2016-11-08 DIAGNOSIS — Z79891 Long term (current) use of opiate analgesic: Secondary | ICD-10-CM | POA: Diagnosis not present

## 2016-11-08 MED ORDER — OXYCODONE HCL 10 MG PO TABS: 10.0000 mg | ORAL_TABLET | Freq: Three times a day (TID) | ORAL | 0 refills | Status: DC | PRN

## 2016-11-08 NOTE — Telephone Encounter (Signed)
On January 30/2018 the [place] was reviewed no conflict was seen on the [place] with multiple prescribers. Seth [name] has a signed narcotic contract with our office. If there were any discrepancies this would have been reported to his physician.

## 2016-11-08 NOTE — [facility] Notes (Signed)
Subjective:    [name], male    DOB: 08-27-1966, 51 y.o.   MRN: [id number]  HPI:  [name] is a 51 year old male who returns for follow up appointment and medication refill. He states his pain is located in his mid- back mainly left side. He rates his pain 8. His current exercise regime is performing stretching exercises, walking with his cane, and attending the [place] twice a week   He's on Hemodialysis three days a week, Monday, Wednesday and Friday.  Past Medical History: Cord Compression Myelopathy Past Surgical History: T-1- T-7 Posterior Lateral Fusion with Pedicule screw instrumentation via Dr. Annette Stable.  Pain Inventory Average Pain 7 Pain Right Now 8 My pain is dull  In the last 24 hours, has pain interfered with the following? General activity 5 Relation with others 5 Enjoyment of life 5 What TIME of day is your pain at its worst? morning, evening, night Sleep (in general) Fair  Pain is worse with: walking and some activites Pain improves with: rest and medication Relief from Meds: 7  Mobility use a cane ability to climb steps?  yes do you drive?  yes  Function disabled: date disabled /a  Neuro/Psych numbness tingling trouble walking spasms  Prior Studies Any changes since last visit?  no  Physicians involved in your care Any changes since last visit?  no   Family History  Problem Relation Age of Onset  . Diabetes Mother   . Diabetes Sister   . Diabetes Brother    Social History   Social History  . Marital status: Single    Spouse name: N/A  . Number of children: N/A  . Years of education: N/A   Social History Main Topics  . Smoking status: Never Smoker  . Smokeless tobacco: Never Used  . Alcohol use No  . Drug use: Yes    Types: Marijuana     Comment: ocassionaly   . Sexual activity: Not Currently   Other Topics Concern  . 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  Divorce with 5 children, 3 boys and 2 girls.  Lives in [place].    Past Surgical History:  Procedure Laterality Date  . AV FISTULA PLACEMENT  05/03/2012   Procedure: ARTERIOVENOUS (AV) FISTULA CREATION;  Surgeon: Mal [name], MD;  Location: [place];  Service: Vascular;  Laterality: Right;  . TEE WITHOUT CARDIOVERSION N/A 07/10/2015   Procedure: TRANSESOPHAGEAL ECHOCARDIOGRAM (TEE);  Surgeon: Josue Hector, MD;  Location: [place];  Service: Cardiovascular;  Laterality: N/A;  . TRACHEOSTOMY TUBE PLACEMENT N/A 08/20/2013   Procedure: TRACHEOSTOMY Revision;  Surgeon: Melida Quitter, MD;  Location: [place];  Service: ENT;  Laterality: N/A;   Past Medical History:  Diagnosis Date  . Diabetes mellitus    INSULIN DEPENDENT DIABETES  . 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 ESRD (end stage renal disease) on dialysis ([place])   . Hypertension   . 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 Sleep apnea    USES CPAP   BP 129/83   Pulse 85   Resp 16   SpO2 96%   Opioid Risk Score:   Fall Risk Score:  `1  Depression screen PHQ 2/9  Depression screen PHQ  2/9 06/07/2016 03/22/2016 01/13/2016 01/01/2016 11/03/2015 09/30/2015 09/15/2015  Decreased Interest 0 0 0 1 1 0 0  Down, Depressed, Hopeless 0 0 0 1 1 0 0  PHQ - 2 Score 0 0 0 2 2 0 0  Altered sleeping - - - - 3 - -  Tired, decreased energy - - - - 0 - -  Change in appetite - - - - 0 - -  Feeling bad or failure about yourself  - - - - 1 - -  Trouble concentrating - - - - 0 - -  Moving slowly or fidgety/restless - - - - 0 - -  Suicidal thoughts - - - - 0 - -  PHQ-9 Score - - - - 6 - -  Difficult doing work/chores - - - - Somewhat difficult - -       Review of Systems  Constitutional: Negative.   HENT: Negative.   Eyes: Negative.   Respiratory: Negative.   Cardiovascular:  Negative.   Gastrointestinal: Negative.   Endocrine: Negative.   Genitourinary: Negative.   Musculoskeletal: Negative.   Skin: Negative.   Allergic/Immunologic: Negative.   Neurological: Negative.   Hematological: Negative.   Psychiatric/Behavioral: Negative.   All other systems reviewed and are negative.      Objective:   Physical Exam  Constitutional: He is oriented to person, place, and time. He appears well-developed and well-nourished.  HENT:  Head: Normocephalic and atraumatic.  Neck: Normal range of motion. Neck supple.  Cardiovascular: Normal rate and regular rhythm.   Pulmonary/Chest: Effort normal and breath sounds normal.  Musculoskeletal:  Normal Muscle Bulk and Muscle Testing Reveals: Upper Extremities: Full ROM and Muscle Strength 5/5 Thoracic Paraspinal Tenderness: T-3-T-7 Mainly Left Side Lower Extremities: Full ROM and Muscle Strength 5/5 Arises from Table with ease Narrow Based Gait  Neurological: He is alert and oriented to person, place, and time.  Skin: Skin is warm and dry.  Psychiatric: He has a normal mood and affect.  Nursing note and vitals reviewed.         Assessment & Plan:  1. 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 Pain Management: Refilled: Oxycodone 10 mg one tablet every 8 hours as needed #90.  We will continue the opioid monitoring program, this consists of regular clinic visits, examinations, urine drug screen, pill counts as well as use of [place] Controlled Substance Reporting System.  Continue with Ice Therapy and to increase Activity as tolerated.  3. ESRD: On Hemodialysis: Nephrology Following   20 minutes of face to face patient care time was spent during this visit. All questions were encouraged and answered.  F/U in 56month

## 2016-11-09 ENCOUNTER — Telehealth: Payer: Self-pay | Admitting: Internal Medicine

## 2016-11-09 ENCOUNTER — Encounter: Payer: Self-pay | Admitting: Internal Medicine

## 2016-11-09 ENCOUNTER — Ambulatory Visit (INDEPENDENT_AMBULATORY_CARE_PROVIDER_SITE_OTHER): Payer: Medicare Other | Admitting: Internal Medicine

## 2016-11-09 VITALS — BP 122/60 | HR 93 | Temp 97.8°F | Ht 68.0 in | Wt 312.4 lb

## 2016-11-09 DIAGNOSIS — R05 Cough: Secondary | ICD-10-CM | POA: Diagnosis not present

## 2016-11-09 DIAGNOSIS — G9529 Other cord compression: Secondary | ICD-10-CM | POA: Diagnosis not present

## 2016-11-09 DIAGNOSIS — J3489 Other specified disorders of nose and nasal sinuses: Secondary | ICD-10-CM | POA: Diagnosis not present

## 2016-11-09 DIAGNOSIS — G9520 Unspecified cord compression: Secondary | ICD-10-CM

## 2016-11-09 DIAGNOSIS — E1122 Type 2 diabetes mellitus with diabetic chronic kidney disease: Secondary | ICD-10-CM | POA: Diagnosis not present

## 2016-11-09 DIAGNOSIS — Z0000 Encounter for general adult medical examination without abnormal findings: Secondary | ICD-10-CM

## 2016-11-09 DIAGNOSIS — R51 Headache: Secondary | ICD-10-CM

## 2016-11-09 DIAGNOSIS — I10 Essential (primary) hypertension: Secondary | ICD-10-CM

## 2016-11-09 DIAGNOSIS — E1159 Type 2 diabetes mellitus with other circulatory complications: Secondary | ICD-10-CM

## 2016-11-09 DIAGNOSIS — Z794 Long term (current) use of insulin: Secondary | ICD-10-CM | POA: Diagnosis not present

## 2016-11-09 DIAGNOSIS — L853 Xerosis cutis: Secondary | ICD-10-CM | POA: Diagnosis not present

## 2016-11-09 DIAGNOSIS — Z79899 Other long term (current) drug therapy: Secondary | ICD-10-CM

## 2016-11-09 DIAGNOSIS — Z992 Dependence on renal dialysis: Secondary | ICD-10-CM | POA: Diagnosis not present

## 2016-11-09 DIAGNOSIS — Z6841 Body Mass Index (BMI) 40.0 and over, adult: Secondary | ICD-10-CM

## 2016-11-09 DIAGNOSIS — J069 Acute upper respiratory infection, unspecified: Secondary | ICD-10-CM

## 2016-11-09 DIAGNOSIS — N186 End stage renal disease: Secondary | ICD-10-CM

## 2016-11-09 DIAGNOSIS — I152 Hypertension secondary to endocrine disorders: Secondary | ICD-10-CM

## 2016-11-09 DIAGNOSIS — I120 Hypertensive chronic kidney disease with stage 5 chronic kidney disease or end stage renal disease: Secondary | ICD-10-CM | POA: Diagnosis not present

## 2016-11-09 LAB — GLUCOSE, CAPILLARY: GLUCOSE-CAPILLARY: 263 mg/dL — AB (ref 65–99)

## 2016-11-09 LAB — POCT GLYCOSYLATED HEMOGLOBIN (HGB A1C): HEMOGLOBIN A1C: 9.4

## 2016-11-09 MED ORDER — INSULIN ASPART 100 UNIT/ML ~~LOC~~ SOLN: 0.0000 [IU] | Freq: Three times a day (TID) | SUBCUTANEOUS | 12 refills | Status: DC

## 2016-11-09 NOTE — Telephone Encounter (Signed)
Pt is here today for appointment to see pcp.  Discarded letter from 11/07/16.

## 2016-11-09 NOTE — [facility] Notes (Signed)
   CC: T2DM  HPI:  [name] is a 51 y.o. 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  T2DM: Patient currently takes Glimepiride 2 mg daily and Novolog SSI TIDAC. His last A1c was 7.8 in April 2017. He does check his blood sugar at home and sees most values between 180-235. He does report that his home SSI instructs him to use a lower dose on insulin than is prescribed here. He denies any hypoglycemic or hyperglycemic episodes.  HTN: Patient reports adherence to Amlodipine 10 mg daily.  ESRD on MWF HD: Patient reports tolerating HD well without missed sessions.  Cord Compression Myelopathy: Patient follows with Rehab Medicine and continues to report improved mobility. He no longer requires a wheelchair and uses a cane as needed. He denies any falls. His pain is well controlled with Oxycodone prescribed by Rehab Medicine. He denies any focal weakness or bowel/bladder dysfunction.  Viral URI: Patient reports recent symptoms of dry cough and clear rhinorrhea with mild headache. He has not tried anything for his symptoms which are not particularly bothersome.   Dry Skin: Patient reports noticing dry skin recently after he changed soap to dial and recent detergent change.   Preventative Health Care: Patient reports receiving the flu shot about 2 months ago.   Past Medical History:  Diagnosis Date  . Diabetes mellitus    INSULIN DEPENDENT DIABETES  . 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 ESRD (end stage renal disease) on dialysis ([place])   . Hypertension   . 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 Sleep apnea    USES CPAP    Review of Systems:   Review of Systems  Constitutional: Negative for chills and fever.  HENT:   Clear rhinorrhea  Respiratory: Positive for cough. Negative for hemoptysis, sputum production and shortness of breath.   Cardiovascular: Negative for chest pain.  Musculoskeletal: Positive for back pain. Negative for falls.  Neurological: Negative for focal weakness and loss of consciousness.       Decreased sensation on back in the thoracic area at prior surgical site. Mild occasional headache.     Physical Exam:  Vitals:   11/09/16 1442  BP: 122/60  Pulse: 93  Temp: 97.8 F (36.6 C)  TempSrc: Oral  SpO2: 100%  Weight: (!) 312 lb 6.4 oz (141.7 kg)  Height: 5\' 8"  (1.727 m)   Physical Exam  Constitutional: He is oriented to person, place, and time. He appears well-developed and well-nourished. No distress.  Obese man  HENT:  Head: Normocephalic and atraumatic.  Cardiovascular: Normal rate and regular rhythm.   RUE AVF with palpable thrill  Pulmonary/Chest: Effort normal. No respiratory distress. He has no wheezes. He has no rales.  Musculoskeletal:  Strength 5/5 b/l LE with knee extension and hip flexion against resistance. No spinous tenderness on palpation. Ambulates with assistance from cane.   Neurological: He is alert and oriented to person, place, and time.  Skin: Skin is dry.     Assessment & Plan:   See Encounters Tab for problem based charting.  Patient discussed with Dr. Daryll Drown

## 2016-11-09 NOTE — Telephone Encounter (Signed)
Thank you :)

## 2016-11-09 NOTE — Patient Instructions (Addendum)
It was a pleasure to see you again Seth [name].  Try over the counter allergy and cough medicine for your cold. Try soups/broths, hot showers, and sinus irrigation (Neti-Pot) as well.  Take your Novolog Sliding Scale as follows:  Inject 0-15 Units into the skin 3 (three) times daily with meals. CBG < 70: drink orange juice and recheck glucose or use glucose tabs CBG > 400: call MD   CBG 70 - 120: 0 units CBG 121 - 150: 3 units CBG 151 - 200: 4 units CBG 201 - 250: 7 units CBG 251 - 300: 11 units CBG 301 - 350: 15 units CBG 351 - 400: 20 units  You can use Cetaphil lotion for your dry skin.  Please follow up with me in 3 months.

## 2016-11-14 DIAGNOSIS — L853 Xerosis cutis: Secondary | ICD-10-CM | POA: Insufficient documentation

## 2016-11-14 DIAGNOSIS — J069 Acute upper respiratory infection, unspecified: Secondary | ICD-10-CM | POA: Insufficient documentation

## 2016-11-14 NOTE — Assessment & Plan Note (Signed)
Patient follows with Rehab Medicine and continues to report improved mobility. He no longer requires a wheelchair and uses a cane as needed. He denies any falls. His pain is well controlled with Oxycodone prescribed by Rehab Medicine. He denies any focal weakness or bowel/bladder dysfunction.  Patient continues to progress well and will continue to follow with Rehab Medicine.

## 2016-11-14 NOTE — Assessment & Plan Note (Signed)
Patient reports tolerating HD well without missed sessions. He will continue dialysis per nephrology.

## 2016-11-14 NOTE — Assessment & Plan Note (Signed)
Patient currently takes Glimepiride 2 mg daily and Novolog SSI TIDAC. His last A1c was 7.8 in April 2017. He does check his blood sugar at home and sees most values between 180-235. He does report that his home SSI instructs him to use a lower dose on insulin than is prescribed here. He denies any hypoglycemic or hyperglycemic episodes.  Repeat A1c this visit is 9.4 indicating worsening diabetic control. He does report using 3-4 units of his Novolog SSI less than prescribed. I have advised him to use his SSI as prescribed and provided him with the instructions on his AVS as follows: CBG 70 - 120: 0 units CBG 121 - 150: 3 units CBG 151 - 200: 4 units CBG 201 - 250: 7 units CBG 251 - 300: 11 units CBG 301 - 350: 15 units CBG 351 - 400: 20 units  He will continue his Glimepiride 2 mg daily and follow up in 3 months for repeat A1c.

## 2016-11-14 NOTE — Assessment & Plan Note (Signed)
Patient reports receiving the flu shot about 2 months ago.

## 2016-11-14 NOTE — Assessment & Plan Note (Signed)
Patient reports adherence to Amlodipine 10 mg daily.  BP this visit is 122/60, well controlled. Will continue current medications.

## 2016-11-14 NOTE — Assessment & Plan Note (Signed)
Patient reports recent symptoms of dry cough and clear rhinorrhea with mild headache. He has not tried anything for his symptoms which are not particularly bothersome.  His symptoms are consistent with a mild viral URI. He will try conservative supportive measures with saline irrigation, OTC allergy medications, and tylenol as needed.

## 2016-11-14 NOTE — Assessment & Plan Note (Signed)
Patient reports noticing dry skin recently after he changed soap to dial and recent detergent change.  Dry skin likely from recent change in soap and/or detergent. I have recommended he stop dial soap and try dove soap and/or Cetaphil lotion. If symptoms persist can consider further workup.

## 2016-11-15 NOTE — [facility] Notes (Signed)
Internal Medicine Clinic Attending  Case discussed with Dr. Patel,Vishal soon after the resident saw the patient.  We reviewed the resident's history and exam and pertinent patient test results.  I agree with the assessment, diagnosis, and plan of care documented in the resident's note. 

## 2016-11-24 MED FILL — ATORVASTATIN 40 MG TABLET: 40 | 30 days supply | Qty: 30 | Fill #1

## 2016-11-25 MED FILL — AMLODIPINE BESYLATE 10 MG T: 10 | 90 days supply | Qty: 180 | Fill #0

## 2016-11-29 ENCOUNTER — Telehealth: Payer: Self-pay | Admitting: *Deleted

## 2016-11-29 ENCOUNTER — Telehealth: Payer: Self-pay | Admitting: Internal Medicine

## 2016-11-29 MED FILL — UNIFINE PENTIPS 31GX3/16": 31G X 5 MM | 30 days supply | Qty: 100 | Fill #0

## 2016-11-29 MED FILL — NOVOLOG FLEXPEN SYRINGE: 100 | 25 days supply | Qty: 15 | Fill #0

## 2016-11-29 MED FILL — UNIFINE PENTIPS 31GX3/16: 31G X 5 MM | 30 days supply | Qty: 100 | Fill #0

## 2016-11-29 NOTE — Telephone Encounter (Signed)
Called pt, clarified script at [facility]

## 2016-11-29 NOTE — Telephone Encounter (Signed)
Pt has question about med

## 2016-11-29 NOTE — Telephone Encounter (Signed)
Dr patel, pt called this am and wanted to ask where his insulin script was sent, hadnt picked it up yet, after reviewing I saw it was at [facility], but actually it had been printed, I called it in to [facility] and they ask if he could have pens, VO for pens given also elizabeth the pharm there entered an order for pen needles for a year, he has now picked the novolog up with pen needles, just an [place]

## 2016-11-30 NOTE — Telephone Encounter (Signed)
Thank you Seth [name] 

## 2016-12-01 ENCOUNTER — Encounter: Payer: Medicare Other | Attending: Physical Medicine & Rehabilitation | Admitting: Registered Nurse

## 2016-12-01 ENCOUNTER — Encounter: Payer: Self-pay | Admitting: Registered Nurse

## 2016-12-01 VITALS — BP 111/77 | HR 86

## 2016-12-01 DIAGNOSIS — Z5181 Encounter for therapeutic drug level monitoring: Secondary | ICD-10-CM

## 2016-12-01 DIAGNOSIS — R200 Anesthesia of skin: Secondary | ICD-10-CM | POA: Insufficient documentation

## 2016-12-01 DIAGNOSIS — G4730 Sleep apnea, unspecified: Secondary | ICD-10-CM | POA: Insufficient documentation

## 2016-12-01 DIAGNOSIS — G8929 Other chronic pain: Secondary | ICD-10-CM | POA: Diagnosis not present

## 2016-12-01 DIAGNOSIS — Z794 Long term (current) use of insulin: Secondary | ICD-10-CM | POA: Insufficient documentation

## 2016-12-01 DIAGNOSIS — M1711 Unilateral primary osteoarthritis, right knee: Secondary | ICD-10-CM | POA: Diagnosis not present

## 2016-12-01 DIAGNOSIS — Z833 Family history of diabetes mellitus: Secondary | ICD-10-CM | POA: Insufficient documentation

## 2016-12-01 DIAGNOSIS — G894 Chronic pain syndrome: Secondary | ICD-10-CM

## 2016-12-01 DIAGNOSIS — H3320 Serous retinal detachment, unspecified eye: Secondary | ICD-10-CM | POA: Insufficient documentation

## 2016-12-01 DIAGNOSIS — G9520 Unspecified cord compression: Secondary | ICD-10-CM

## 2016-12-01 DIAGNOSIS — G128 Other spinal muscular atrophies and related syndromes: Secondary | ICD-10-CM | POA: Diagnosis not present

## 2016-12-01 DIAGNOSIS — R202 Paresthesia of skin: Secondary | ICD-10-CM | POA: Insufficient documentation

## 2016-12-01 DIAGNOSIS — M546 Pain in thoracic spine: Secondary | ICD-10-CM | POA: Diagnosis not present

## 2016-12-01 DIAGNOSIS — M4644 Discitis, unspecified, thoracic region: Secondary | ICD-10-CM | POA: Diagnosis not present

## 2016-12-01 DIAGNOSIS — M545 Low back pain: Secondary | ICD-10-CM

## 2016-12-01 DIAGNOSIS — Z79899 Other long term (current) drug therapy: Secondary | ICD-10-CM

## 2016-12-01 DIAGNOSIS — N186 End stage renal disease: Secondary | ICD-10-CM | POA: Insufficient documentation

## 2016-12-01 DIAGNOSIS — E119 Type 2 diabetes mellitus without complications: Secondary | ICD-10-CM | POA: Insufficient documentation

## 2016-12-01 DIAGNOSIS — I120 Hypertensive chronic kidney disease with stage 5 chronic kidney disease or end stage renal disease: Secondary | ICD-10-CM | POA: Diagnosis not present

## 2016-12-01 DIAGNOSIS — Z79891 Long term (current) use of opiate analgesic: Secondary | ICD-10-CM | POA: Insufficient documentation

## 2016-12-01 DIAGNOSIS — Z992 Dependence on renal dialysis: Secondary | ICD-10-CM | POA: Insufficient documentation

## 2016-12-01 MED ORDER — OXYCODONE HCL 10 MG PO TABS: 10.0000 mg | ORAL_TABLET | Freq: Three times a day (TID) | ORAL | 0 refills | Status: DC | PRN

## 2016-12-01 NOTE — Progress Notes (Signed)
Subjective:    [name], male    DOB: [birth year], 51 y.o.   MRN: [id number]  HPI:  [name] is a 51 year old male who returns for follow up appointment and medication refill. He states his pain is located in his mid- back mainly left side. He rates his pain 7. His current exercise regime is performing stretching exercises, walking with his cane, and attending the [place] twice a week   He's on Hemodialysis three days a week, Monday, Wednesday and Friday.  Past Medical History: Cord Compression Myelopathy Past Surgical History: T-1- T-7 Posterior Lateral Fusion with Pedicule screw instrumentation via Dr. Annette Stable.   Pain Inventory Average Pain 7 Pain Right Now 7 My pain is sharp, dull, tingling and aching  In the last 24 hours, has pain interfered with the following? General activity 7 Relation with others 9 Enjoyment of life 9 What TIME of day is your pain at its worst? all Sleep (in general) Fair  Pain is worse with: walking, bending, standing and some activites Pain improves with: rest and medication Relief from Meds: 5  Mobility use a cane ability to climb steps?  no do you drive?  yes  Function Do you have any goals in this area?  no  Neuro/Psych No problems in this area  Prior Studies Any changes since last visit?  no  Physicians involved in your care Any changes since last visit?  no   Family History  Problem Relation Age of Onset  . Diabetes Mother   . Diabetes Sister   . Diabetes Brother    Social History   Social History  . Marital status: Single    Spouse name: N/A  . Number of children: N/A  . Years of education: N/A   Social History Main Topics  . Smoking status: Never Smoker  . Smokeless tobacco: Never Used  . Alcohol use No  . Drug use: Yes    Types: Marijuana     Comment: ocassionaly   . Sexual activity: Not Currently   Other Topics Concern  . 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  Divorce with 5 children, 3 boys and 2 girls.  Lives in [place].    Past Surgical History:  Procedure Laterality Date  . AV FISTULA PLACEMENT  05/03/2012   Procedure: ARTERIOVENOUS (AV) FISTULA CREATION;  Surgeon: Mal [name], MD;  Location: [place];  Service: Vascular;  Laterality: Right;  . TEE WITHOUT CARDIOVERSION N/A 07/10/2015   Procedure: TRANSESOPHAGEAL ECHOCARDIOGRAM (TEE);  Surgeon: Josue Hector, MD;  Location: [facility];  Service: Cardiovascular;  Laterality: N/A;  . TRACHEOSTOMY TUBE PLACEMENT N/A 08/20/2013   Procedure: TRACHEOSTOMY Revision;  Surgeon: Melida Quitter, MD;  Location: [place] [place];  Service: ENT;  Laterality: N/A;   Past Medical History:  Diagnosis Date  . Diabetes mellitus    INSULIN DEPENDENT DIABETES  . 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 ESRD (end stage renal disease) on dialysis ([place])   . Hypertension   . 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 Sleep apnea    USES CPAP   There were no vitals taken for this visit.  Opioid Risk Score:   Fall Risk Score:  `1  Depression screen  PHQ 2/9  Depression screen PHQ 2/9 11/09/2016 06/07/2016 03/22/2016 01/13/2016 01/01/2016 11/03/2015 09/30/2015  Decreased Interest 0 0 0 0 1 1 0  Down, Depressed, Hopeless 0 0 0 0 1 1 0  PHQ - 2 Score 0 0 0 0 2 2 0  Altered sleeping - - - - - 3 -  Tired, decreased energy - - - - - 0 -  Change in appetite - - - - - 0 -  Feeling bad or failure about yourself  - - - - - 1 -  Trouble concentrating - - - - - 0 -  Moving slowly or fidgety/restless - - - - - 0 -  Suicidal thoughts - - - - - 0 -  PHQ-9 Score - - - - - 6 -  Difficult doing work/chores - - - - - Somewhat difficult -    Review of Systems  Constitutional: Negative.   HENT: Negative.   Eyes: Negative.   Respiratory: Negative.     Cardiovascular: Negative.   Gastrointestinal: Negative.   Endocrine: Negative.   Genitourinary: Negative.   Musculoskeletal: Positive for back pain.  Skin: Negative.   Allergic/Immunologic: Negative.   Neurological: Negative.   Hematological: Negative.   Psychiatric/Behavioral: Negative.   All other systems reviewed and are negative.      Objective:   Physical Exam  Constitutional: He is oriented to person, place, and time. He appears well-developed and well-nourished.  HENT:  Head: Normocephalic and atraumatic.  Neck: Normal range of motion. Neck supple.  Cardiovascular: Normal rate and regular rhythm.   Pulmonary/Chest: Effort normal and breath sounds normal.  Musculoskeletal:  Normal Muscle Bulk and Muscle Testing Reveals:  Upper Extremities: Full ROM and Muscle Strength 5/5 Thoracic Paraspinal Tenderness: T-3-T-7 Lower Extremities: Full ROM and Muscle Strength 5/5 Arises from Table with ease, using 3 prong cane for support Narrow Based Gait  Neurological: He is alert and oriented to person, place, and time.  Skin: Skin is warm and dry.  Psychiatric: He has a normal mood and affect.  Nursing note and vitals reviewed.         Assessment & Plan:  1. 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 12/01/2016 2. Pain Management: Refilled: Oxycodone 10 mg one tablet every 8 hours as needed #90. 12/01/2016 We will continue the opioid monitoring program, this consists of regular clinic visits, examinations, urine drug screen, pill counts as well as use of [place] Controlled Substance Reporting System.  Continue with Ice Therapy and to increase Activity as tolerated.  3. ESRD: On Hemodialysis: Nephrology Following. 12/01/2016   20 minutes of face to face patient care time was spent during this visit. All questions were encouraged and  answered.    F/U in 46month

## 2016-12-28 ENCOUNTER — Ambulatory Visit: Payer: Medicare Other | Admitting: Physical Medicine & Rehabilitation

## 2016-12-29 ENCOUNTER — Encounter: Payer: Medicare Other | Attending: Physical Medicine & Rehabilitation | Admitting: Registered Nurse

## 2016-12-29 ENCOUNTER — Encounter: Payer: Self-pay | Admitting: Registered Nurse

## 2016-12-29 VITALS — BP 135/82 | HR 80

## 2016-12-29 DIAGNOSIS — G9520 Unspecified cord compression: Secondary | ICD-10-CM

## 2016-12-29 DIAGNOSIS — R202 Paresthesia of skin: Secondary | ICD-10-CM | POA: Diagnosis not present

## 2016-12-29 DIAGNOSIS — G128 Other spinal muscular atrophies and related syndromes: Secondary | ICD-10-CM | POA: Insufficient documentation

## 2016-12-29 DIAGNOSIS — Z833 Family history of diabetes mellitus: Secondary | ICD-10-CM | POA: Insufficient documentation

## 2016-12-29 DIAGNOSIS — Z992 Dependence on renal dialysis: Secondary | ICD-10-CM | POA: Insufficient documentation

## 2016-12-29 DIAGNOSIS — H3320 Serous retinal detachment, unspecified eye: Secondary | ICD-10-CM | POA: Diagnosis not present

## 2016-12-29 DIAGNOSIS — G894 Chronic pain syndrome: Secondary | ICD-10-CM

## 2016-12-29 DIAGNOSIS — Z5181 Encounter for therapeutic drug level monitoring: Secondary | ICD-10-CM | POA: Diagnosis not present

## 2016-12-29 DIAGNOSIS — Z794 Long term (current) use of insulin: Secondary | ICD-10-CM | POA: Diagnosis not present

## 2016-12-29 DIAGNOSIS — M546 Pain in thoracic spine: Secondary | ICD-10-CM

## 2016-12-29 DIAGNOSIS — M4644 Discitis, unspecified, thoracic region: Secondary | ICD-10-CM | POA: Insufficient documentation

## 2016-12-29 DIAGNOSIS — M1711 Unilateral primary osteoarthritis, right knee: Secondary | ICD-10-CM | POA: Diagnosis not present

## 2016-12-29 DIAGNOSIS — G4730 Sleep apnea, unspecified: Secondary | ICD-10-CM | POA: Diagnosis not present

## 2016-12-29 DIAGNOSIS — Z79899 Other long term (current) drug therapy: Secondary | ICD-10-CM

## 2016-12-29 DIAGNOSIS — I120 Hypertensive chronic kidney disease with stage 5 chronic kidney disease or end stage renal disease: Secondary | ICD-10-CM | POA: Insufficient documentation

## 2016-12-29 DIAGNOSIS — R200 Anesthesia of skin: Secondary | ICD-10-CM | POA: Diagnosis not present

## 2016-12-29 DIAGNOSIS — N186 End stage renal disease: Secondary | ICD-10-CM | POA: Insufficient documentation

## 2016-12-29 DIAGNOSIS — Z79891 Long term (current) use of opiate analgesic: Secondary | ICD-10-CM | POA: Insufficient documentation

## 2016-12-29 DIAGNOSIS — E119 Type 2 diabetes mellitus without complications: Secondary | ICD-10-CM | POA: Insufficient documentation

## 2016-12-29 MED ORDER — OXYCODONE HCL 10 MG PO TABS: 10.0000 mg | ORAL_TABLET | Freq: Three times a day (TID) | ORAL | 0 refills | Status: DC | PRN

## 2016-12-29 MED FILL — ATORVASTATIN 40 MG TABLET: 40 | 30 days supply | Qty: 30 | Fill #2

## 2016-12-29 NOTE — Progress Notes (Signed)
Subjective:    [name], male    DOB: [birth year], 51 y.o.   MRN: [id number]  HPI: [name] is a 51 year old male who returns for follow up appointment and medication refill. He states his pain is located in his mid- back mainly left side. He rates his pain 8. His current exercise regime is performing stretching exercises,walking and attending the [facility] twice a week   He's on Hemodialysis three days a week, Monday, Wednesday and Friday.  Past Medical History: Cord Compression Myelopathy Past Surgical History: T-1- T-7 Posterior Lateral Fusion with Pedicule screw instrumentation via Dr. Annette Stable.   Pain Inventory Average Pain 7 Pain Right Now 8 My pain is sharp, dull, tingling and aching  In the last 24 hours, has pain interfered with the following? General activity 10 Relation with others 9 Enjoyment of life 10 What TIME of day is your pain at its worst? all Sleep (in general) Fair  Pain is worse with: walking, bending, standing and some activites Pain improves with: rest and medication Relief from Meds: 7  Mobility walk without assistance ability to climb steps?  no do you drive?  yes  Function Do you have any goals in this area?  no  Neuro/Psych No problems in this area  Prior Studies Any changes since last visit?  no  Physicians involved in your care Any changes since last visit?  no   Family History  Problem Relation Age of Onset  . Diabetes Mother   . Diabetes Sister   . Diabetes Brother    Social History   Social History  . Marital status: Single    Spouse name: N/A  . Number of children: N/A  . Years of education: N/A   Social History Main Topics  . Smoking status: Never Smoker  . Smokeless tobacco: Never Used  . Alcohol use No  . Drug use: Yes    Types: Marijuana     Comment: ocassionaly   . Sexual activity: Not Currently   Other Topics Concern  . 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  Divorce with 5 children, 3 boys and 2 girls.  Lives in [place].    Past Surgical History:  Procedure Laterality Date  . AV FISTULA PLACEMENT  05/03/2012   Procedure: ARTERIOVENOUS (AV) FISTULA CREATION;  Surgeon: Mal [name], MD;  Location: [place] [facility];  Service: Vascular;  Laterality: Right;  . TEE WITHOUT CARDIOVERSION N/A 07/10/2015   Procedure: TRANSESOPHAGEAL ECHOCARDIOGRAM (TEE);  Surgeon: Josue Hector, MD;  Location: [place];  Service: Cardiovascular;  Laterality: N/A;  . TRACHEOSTOMY TUBE PLACEMENT N/A 08/20/2013   Procedure: TRACHEOSTOMY Revision;  Surgeon: Melida Quitter, MD;  Location: [facility];  Service: ENT;  Laterality: N/A;   Past Medical History:  Diagnosis Date  . Diabetes mellitus    INSULIN DEPENDENT DIABETES  . 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 ESRD (end stage renal disease) on dialysis ([place])   . Hypertension   . 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 Sleep apnea    USES CPAP   BP 135/82 (BP Location: Left Arm, Patient Position: Sitting, Cuff Size: Large)   Pulse 80   SpO2 94%   Opioid Risk Score:   Fall Risk  Score:  `1  Depression screen PHQ 2/9  Depression screen [facility] 2/9 11/09/2016 06/07/2016 03/22/2016 01/13/2016 01/01/2016 11/03/2015 09/30/2015  Decreased Interest 0 0 0 0 1 1 0  Down, Depressed, Hopeless 0 0 0 0 1 1 0  PHQ - 2 Score 0 0 0 0 2 2 0  Altered sleeping - - - - - 3 -  Tired, decreased energy - - - - - 0 -  Change in appetite - - - - - 0 -  Feeling bad or failure about yourself  - - - - - 1 -  Trouble concentrating - - - - - 0 -  Moving slowly or fidgety/restless - - - - - 0 -  Suicidal thoughts - - - - - 0 -  PHQ-9 Score - - - - - 6 -  Difficult doing work/chores - - - - - Somewhat difficult -    Review of Systems  Constitutional: Negative.   HENT:  Negative.   Eyes: Negative.   Respiratory: Negative.   Cardiovascular: Negative.   Gastrointestinal: Negative.   Endocrine: Negative.   Genitourinary: Negative.   Musculoskeletal: Negative.   Skin: Negative.   Allergic/Immunologic: Negative.   Neurological: Negative.   Hematological: Negative.   Psychiatric/Behavioral: Negative.   All other systems reviewed and are negative.      Objective:   Physical Exam  Constitutional: He is oriented to person, place, and time. He appears well-developed and well-nourished.  HENT:  Head: Normocephalic and atraumatic.  Neck: Normal range of motion. Neck supple.  Cardiovascular: Normal rate and regular rhythm.   Pulmonary/Chest: Effort normal and breath sounds normal.  Musculoskeletal:  Normal Muscle Bulk and Muscle Testing Reveals: Upper Extremities: Full ROM and Muscle Strength 5/5 Thoracic Paraspinal Tenderness: T-3-T-5 Lower Extremities: Full ROM and Muscle Strength 5/5 Arises from Table with ease Narrow Based Gait   Neurological: He is alert and oriented to person, place, and time.  Skin: Skin is warm and dry.  Psychiatric: He has a normal mood and affect.  Nursing note and vitals reviewed.         Assessment & Plan:  1. 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 12/29/2016 2. Pain Management: Refilled: Oxycodone 10 mg one tablet every 8 hours as needed #90. 12/29/2016 We will continue the opioid monitoring program, this consists of regular clinic visits, examinations, urine drug screen, pill counts as well as use of [place] Controlled Substance Reporting System.  Continue with Ice Therapy and to increase Activity as tolerated.  3. ESRD: On Hemodialysis: Nephrology Following. 12/29/2016 + AVF    20 minutes of face to face patient care time was spent during this visit. All questions were encouraged and  answered.    F/U in 21month

## 2017-01-31 ENCOUNTER — Encounter: Payer: Self-pay | Admitting: Registered Nurse

## 2017-01-31 ENCOUNTER — Telehealth: Payer: Self-pay | Admitting: Registered Nurse

## 2017-01-31 ENCOUNTER — Encounter: Payer: Medicare Other | Attending: Physical Medicine & Rehabilitation | Admitting: Registered Nurse

## 2017-01-31 VITALS — BP 135/68 | HR 80

## 2017-01-31 DIAGNOSIS — M1711 Unilateral primary osteoarthritis, right knee: Secondary | ICD-10-CM | POA: Diagnosis not present

## 2017-01-31 DIAGNOSIS — G4730 Sleep apnea, unspecified: Secondary | ICD-10-CM | POA: Diagnosis not present

## 2017-01-31 DIAGNOSIS — Z5181 Encounter for therapeutic drug level monitoring: Secondary | ICD-10-CM

## 2017-01-31 DIAGNOSIS — E119 Type 2 diabetes mellitus without complications: Secondary | ICD-10-CM | POA: Insufficient documentation

## 2017-01-31 DIAGNOSIS — Z794 Long term (current) use of insulin: Secondary | ICD-10-CM | POA: Diagnosis not present

## 2017-01-31 DIAGNOSIS — R200 Anesthesia of skin: Secondary | ICD-10-CM | POA: Diagnosis not present

## 2017-01-31 DIAGNOSIS — Z79899 Other long term (current) drug therapy: Secondary | ICD-10-CM

## 2017-01-31 DIAGNOSIS — Z79891 Long term (current) use of opiate analgesic: Secondary | ICD-10-CM | POA: Diagnosis not present

## 2017-01-31 DIAGNOSIS — M546 Pain in thoracic spine: Secondary | ICD-10-CM

## 2017-01-31 DIAGNOSIS — Z992 Dependence on renal dialysis: Secondary | ICD-10-CM | POA: Diagnosis not present

## 2017-01-31 DIAGNOSIS — I120 Hypertensive chronic kidney disease with stage 5 chronic kidney disease or end stage renal disease: Secondary | ICD-10-CM | POA: Diagnosis not present

## 2017-01-31 DIAGNOSIS — G128 Other spinal muscular atrophies and related syndromes: Secondary | ICD-10-CM | POA: Diagnosis not present

## 2017-01-31 DIAGNOSIS — M4644 Discitis, unspecified, thoracic region: Secondary | ICD-10-CM | POA: Insufficient documentation

## 2017-01-31 DIAGNOSIS — G9520 Unspecified cord compression: Secondary | ICD-10-CM | POA: Diagnosis not present

## 2017-01-31 DIAGNOSIS — G894 Chronic pain syndrome: Secondary | ICD-10-CM | POA: Diagnosis not present

## 2017-01-31 DIAGNOSIS — H3320 Serous retinal detachment, unspecified eye: Secondary | ICD-10-CM | POA: Insufficient documentation

## 2017-01-31 DIAGNOSIS — Z833 Family history of diabetes mellitus: Secondary | ICD-10-CM | POA: Insufficient documentation

## 2017-01-31 DIAGNOSIS — R202 Paresthesia of skin: Secondary | ICD-10-CM | POA: Insufficient documentation

## 2017-01-31 DIAGNOSIS — N186 End stage renal disease: Secondary | ICD-10-CM | POA: Diagnosis not present

## 2017-01-31 MED ORDER — OXYCODONE HCL 10 MG PO TABS: 10.0000 mg | ORAL_TABLET | Freq: Three times a day (TID) | ORAL | 0 refills | Status: DC | PRN

## 2017-01-31 NOTE — Progress Notes (Signed)
Subjective:    [name], male    DOB: 1966/05/03, 51 y.o.   MRN: [id number]  HPI: [name] is a 51 year old male who returns for follow up appointment and medication refill. He states his pain is located in his mid- back mainly left side. He rates his pain 8. His current exercise regime is performing stretching exercises,walking and attending the [facility] twice a week   He's on Hemodialysis three days a week, Monday, Wednesday and Friday.  Past Medical History: Cord Compression Myelopathy Past Surgical History: T-1- T-7 Posterior Lateral Fusion with Pedicule screw instrumentation via Dr. Annette Stable.   No UDS because of dialysis and anuric  Pain Inventory Average Pain 7 Pain Right Now 8 My pain is sharp, burning, dull, tingling and aching  In the last 24 hours, has pain interfered with the following? General activity 7 Relation with others 9 Enjoyment of life 10 What TIME of day is your pain at its worst? all Sleep (in general) Fair  Pain is worse with: walking, bending, standing and some activites Pain improves with: rest and medication Relief from Meds: not answered  Mobility walk without assistance  Function Do you have any goals in this area?  not answered  Neuro/Psych trouble walking  Prior Studies Any changes since last visit?  no  Physicians involved in your care Any changes since last visit?  no   Family History  Problem Relation Age of Onset  . Diabetes Mother   . Diabetes Sister   . Diabetes Brother    Social History   Social History  . Marital status: Single    Spouse name: N/A  . Number of children: N/A  . Years of education: N/A   Social History Main Topics  . Smoking status: Never Smoker  . Smokeless tobacco: Never Used  . Alcohol use No  . Drug use: Yes    Types: Marijuana     Comment: ocassionaly   . Sexual activity: Not Currently   Other Topics Concern  . 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  Divorce with 5 children, 3 boys and 2 girls.  Lives in [place].    Past Surgical History:  Procedure Laterality Date  . AV FISTULA PLACEMENT  05/03/2012   Procedure: ARTERIOVENOUS (AV) FISTULA CREATION;  Surgeon: Mal [name], MD;  Location: [place];  Service: Vascular;  Laterality: Right;  . TEE WITHOUT CARDIOVERSION N/A 07/10/2015   Procedure: TRANSESOPHAGEAL ECHOCARDIOGRAM (TEE);  Surgeon: Josue Hector, MD;  Location: [place];  Service: Cardiovascular;  Laterality: N/A;  . TRACHEOSTOMY TUBE PLACEMENT N/A 08/20/2013   Procedure: TRACHEOSTOMY Revision;  Surgeon: Melida Quitter, MD;  Location: [facility];  Service: ENT;  Laterality: N/A;   Past Medical History:  Diagnosis Date  . Diabetes mellitus    INSULIN DEPENDENT DIABETES  . 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 ESRD (end stage renal disease) on dialysis ([place])   . Hypertension   . 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 Sleep apnea    USES CPAP   BP 135/68   Pulse 80   SpO2 95%   Opioid Risk Score:   Fall Risk Score:  `1  Depression screen PHQ 2/9  Depression screen [facility] 2/9  01/31/2017 11/09/2016 06/07/2016 03/22/2016 01/13/2016 01/01/2016 11/03/2015  Decreased Interest 0 0 0 0 0 1 1  Down, Depressed, Hopeless 0 0 0 0 0 1 1  PHQ - 2 Score 0 0 0 0 0 2 2  Altered sleeping - - - - - - 3  Tired, decreased energy - - - - - - 0  Change in appetite - - - - - - 0  Feeling bad or failure about yourself  - - - - - - 1  Trouble concentrating - - - - - - 0  Moving slowly or fidgety/restless - - - - - - 0  Suicidal thoughts - - - - - - 0  PHQ-9 Score - - - - - - 6  Difficult doing work/chores - - - - - - Somewhat difficult   Review of Systems  Constitutional: Negative.   HENT: Negative.   Eyes: Negative.   Respiratory: Negative.     Cardiovascular: Negative.   Gastrointestinal: Negative.   Endocrine: Negative.   Genitourinary: Negative.   Musculoskeletal: Negative.   Skin: Negative.   Neurological: Negative.   Hematological: Negative.   Psychiatric/Behavioral: Negative.   All other systems reviewed and are negative.      Objective:   Physical Exam  Constitutional: He is oriented to person, place, and time. He appears well-developed and well-nourished.  HENT:  Head: Normocephalic and atraumatic.  Neck: Normal range of motion. Neck supple.  Cardiovascular: Normal rate and regular rhythm.   Pulmonary/Chest: Effort normal and breath sounds normal.  Musculoskeletal:  Normal Muscle Bulk and Muscle Testing Reveals: Upper Extremities: Full ROM and Muscle Strength 5/5 Thoracic Paraspinal Tenderness: T-7-T-9 Lower Extremities: Full ROM and Muscle Strength 5/5 Arises from Table with ease Narrow Based Gait   Neurological: He is alert and oriented to person, place, and time.  Skin: Skin is warm and dry.  Psychiatric: He has a normal mood and affect.  Nursing note and vitals reviewed.         Assessment & Plan:  1. 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 01/31/2017 2. Pain Management: Refilled: Oxycodone 10 mg one tablet every 8 hours as needed #90. 01/31/2017 We will continue the opioid monitoring program, this consists of regular clinic visits, examinations, urine drug screen, pill counts as well as use of [place] Controlled Substance Reporting System.  Continue with Ice Therapy and to increase Activity as tolerated.  3. ESRD: On Hemodialysis: Nephrology Following. 01/31/2017 4.. OA of Right Knee: Continue to Monitor  20 minutes of face to face patient care time was spent during this visit. All questions were encouraged and answered.    F/U in 63month

## 2017-01-31 NOTE — Telephone Encounter (Signed)
On 01/31/2017 the [place] was reviewed no conflict was seen on the [place] with multiple prescribers. Mr. Blanck has a signed narcotic contract with our office. If there were any discrepancies this would have been reported to his physician.

## 2017-02-01 ENCOUNTER — Encounter: Payer: Self-pay | Admitting: Internal Medicine

## 2017-02-01 ENCOUNTER — Ambulatory Visit (INDEPENDENT_AMBULATORY_CARE_PROVIDER_SITE_OTHER): Payer: Medicare Other | Admitting: Internal Medicine

## 2017-02-01 ENCOUNTER — Encounter: Payer: Self-pay | Admitting: Gastroenterology

## 2017-02-01 ENCOUNTER — Encounter: Payer: Medicare Other | Admitting: Internal Medicine

## 2017-02-01 VITALS — BP 132/68 | HR 82 | Temp 97.9°F | Wt 307.3 lb

## 2017-02-01 DIAGNOSIS — Z992 Dependence on renal dialysis: Secondary | ICD-10-CM

## 2017-02-01 DIAGNOSIS — G9520 Unspecified cord compression: Secondary | ICD-10-CM

## 2017-02-01 DIAGNOSIS — E1159 Type 2 diabetes mellitus with other circulatory complications: Secondary | ICD-10-CM | POA: Diagnosis not present

## 2017-02-01 DIAGNOSIS — Z794 Long term (current) use of insulin: Secondary | ICD-10-CM | POA: Diagnosis not present

## 2017-02-01 DIAGNOSIS — E669 Obesity, unspecified: Secondary | ICD-10-CM | POA: Diagnosis not present

## 2017-02-01 DIAGNOSIS — Z0000 Encounter for general adult medical examination without abnormal findings: Secondary | ICD-10-CM

## 2017-02-01 DIAGNOSIS — N186 End stage renal disease: Secondary | ICD-10-CM | POA: Diagnosis not present

## 2017-02-01 DIAGNOSIS — E1122 Type 2 diabetes mellitus with diabetic chronic kidney disease: Secondary | ICD-10-CM

## 2017-02-01 DIAGNOSIS — I152 Hypertension secondary to endocrine disorders: Secondary | ICD-10-CM | POA: Diagnosis not present

## 2017-02-01 DIAGNOSIS — I10 Essential (primary) hypertension: Secondary | ICD-10-CM

## 2017-02-01 NOTE — Assessment & Plan Note (Signed)
Patient's BP is improved on repeat at 136/68. We will continue his current Amlodipine 10 mg daily. He is encouraged to continue efforts to lose weight.

## 2017-02-01 NOTE — Assessment & Plan Note (Signed)
He is continuing to progress well. His pain is well-managed by Rehab Medicine.

## 2017-02-01 NOTE — Assessment & Plan Note (Addendum)
He is tolerating dialysis well. He is encouraged in his efforts to continue to lose weight to start the conversation of transplant candidacy. - Continue HD per Nephrology

## 2017-02-01 NOTE — Progress Notes (Signed)
CC: T2DM  HPI:  [name] is a 51 y.o. 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 He is accompanied by his mother.  T2DM: Patient currently takes Glimepiride 2 mg daily and Novolog SSI TIDAC.  His last A1c was 9.4 on 11/09/16. He was not taking enough insulin with his sliding scale on last visit which he has now corrected. He checks his blood sugar at home before each meal and reports most CBS < 200 with a range of 105-218. He brought a copy of the lab work drawn at dialysis which shows that his most recent A1c was 8.1 on 01/16/17. He denies any hypoglycemic or hyperglycemic episodes. He says he is working on losing weight. His weight is 307 lbs this visit compared to 312 lbs ~ 3 months ago. He says he has an eye appointment at the [facility] in [place] on May 8th, 2018.  HTN: Patient reports adherence to Amlodipine 10 mg daily. His BP was 142/89 on arrival and 136/68 on repeat.  ESRD on MWF HD: Patient reports tolerating HD well without missed sessions. He says his kidney doctor thinks he might be considered a transplant candidate if he can continue to lose weight. His access is at his right forearm.  Cord Compression Myelopathy: Patient follows with Rehab Medicine and continues to report improved mobility. He is now walking on his own without assistance. He denies any falls. His pain is well controlled with Oxycodone prescribed by Rehab Medicine. He denies any focal weakness or bowel/bladder dysfunction.   Preventative Health Care: Patient has not had a colonoscopy and is requesting a referral for one. He reports a history of colon cancer in his mother. He is unsure about receiving the Tdap vaccination and will check with his dialysis center to see if he already received this.  Past Medical  History:  Diagnosis Date  . Diabetes mellitus    INSULIN DEPENDENT DIABETES  . 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 ESRD (end stage renal disease) on dialysis ([place])   . Hypertension   . 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 Sleep apnea    USES CPAP    Review of Systems:   Review of Systems  Constitutional: Negative for chills and fever.  Respiratory: Negative for cough, hemoptysis and shortness of breath.   Cardiovascular: Negative for chest pain, orthopnea and leg swelling.  Gastrointestinal: Negative for blood in stool, constipation, diarrhea, melena, nausea and vomiting.  Genitourinary: Negative for hematuria.  Musculoskeletal: Positive for back pain. Negative for falls.  Neurological: Negative for dizziness and sensory change.     Physical Exam:  Vitals:   02/01/17 1345 02/01/17 1412  BP: (!) 142/89 132/68  Pulse: 82 82  Temp: 97.9 F (36.6 C)   TempSrc: Oral   SpO2: 100%   Weight: (!) 307 lb 4.8 oz (139.4 kg)    Physical Exam  Constitutional: He is oriented to person, place, and time. He appears well-developed and well-nourished. No distress.  Pleasant, obese male  Cardiovascular: Normal rate and regular rhythm.   No murmur heard. AVF Right forearmwith palpable thrill  Pulmonary/Chest: Effort normal. No respiratory distress. He has no wheezes. He has no rales.  Musculoskeletal:  Mild tenderness thoracic spine, well healed surgical scar present  Neurological: He is alert  and oriented to person, place, and time.  Decreased sensation left back at ~T4 dermatome  Skin: He is not diaphoretic.    Assessment & Plan:   See Encounters Tab for problem based charting.  Patient discussed with Dr. Lynnae January  Controlled type 2 diabetes mellitus with chronic kidney disease on chronic dialysis [facility]) His diabetic control is improving. He did not bring his meter to verify, but he reports improved daily glycemic control. His P9K is certainly  falsely lower than his actual A1c since he is on dialysis, however he does seem to getting better control of his diabetes. Will continue his current treatments. - Continue Glimepiride 2 mg daily - Continue Novolog SSI - f/u in 3 months - Patient has eye appointment scheduled for May 8th, 2018  Hypertension associated with diabetes ([place]) Patient's BP is improved on repeat at 136/68. We will continue his current Amlodipine 10 mg daily. He is encouraged to continue efforts to lose weight.  End stage renal disease on dialysis [facility]) He is tolerating dialysis well. He is encouraged in his efforts to continue to lose weight to start the conversation of transplant candidacy. - Continue HD per Nephrology  Cord compression myelopathy [facility]) He is continuing to progress well. His pain is well-managed by Rehab Medicine.  Preventative health care Given patient's motivation to continue to lose weight in efforts to become a transplant candidate, I agree that referral for screening colonoscopy are indicated. He understands that screening may result in further workup/procedures if there is any suspicion for malignant lesions. He will let us know if he has had the Tdap shot at dialysis. - Refer to GI for screening Colonoscopy

## 2017-02-01 NOTE — Assessment & Plan Note (Signed)
His diabetic control is improving. He did not bring his meter to verify, but he reports improved daily glycemic control. His O0A is certainly falsely lower than his actual A1c since he is on dialysis, however he does seem to getting better control of his diabetes. Will continue his current treatments. - Continue Glimepiride 2 mg daily - Continue Novolog SSI - f/u in 3 months - Patient has eye appointment scheduled for May 8th, 2018

## 2017-02-01 NOTE — Assessment & Plan Note (Addendum)
Given patient's motivation to continue to lose weight in efforts to become a transplant candidate, I agree that referral for screening colonoscopy are indicated. He understands that screening may result in further workup/procedures if there is any suspicion for malignant lesions. He will let us know if he has had the Tdap shot at dialysis. - Refer to GI for screening Colonoscopy

## 2017-02-01 NOTE — Patient Instructions (Addendum)
It was a pleasure to see you again Seth [name].  I am glad you are getting around better. Keep up the the work towards weight loss.  Your Hgb A1c is coming down, keep up the good work! Continue your Glimepiride and insulin as prescribed.  Your blood pressure today was 132/68, continue your Amlodipine 10 mg once a day.    Colorectal Cancer Screening Colorectal cancer screening is a group of tests used to check for colorectal cancer. Colorectal refers to your colon and rectum. Your colon and rectum are located at the end of your large intestine and carry your bowel movements out of your body. Why is colorectal cancer screening done? It is common for abnormal growths (polyps) to form in the lining of your colon, especially as you get older. These polyps can be cancerous or become cancerous. If colorectal cancer is found at an early stage, it is treatable. Who should be screened for colorectal cancer? Screening is recommended for all adults at average risk starting at age 57. Tests may be recommended every 1 to 10 years. Your health care provider may recommend earlier or more frequent screening if you have:  A history of colorectal cancer or polyps.  A family member with a history of colorectal cancer or polyps.  Inflammatory bowel disease, such as ulcerative colitis or Crohn disease.  A type of hereditary colon cancer syndrome.  Colorectal cancer symptoms. Types of screening tests There are several types of colorectal screening tests. They include:  Guaiac-based fecal occult blood testing.  Fecal immunochemical test (FIT).  Stool DNA test.  Barium enema.  Virtual colonoscopy.  Sigmoidoscopy. During this test, a sigmoidoscope is used to examine your rectum and lower colon. A sigmoidoscope is a flexible tube with a camera that is inserted through your anus into your rectum and lower colon.  Colonoscopy. During this test, a colonoscope is used to examine your entire colon. A  colonoscope is a long, thin, flexible tube with a camera. This test examines your entire colon and rectum. This information is not intended to replace advice given to you by your health care provider. Make sure you discuss any questions you have with your health care provider. Document Released: 03/16/2010 Document Revised: 05/05/2016 Document Reviewed: 01/02/2014 Elsevier Interactive Patient Education  2017 [employer].

## 2017-02-03 NOTE — Progress Notes (Signed)
Internal Medicine Clinic Attending  Case discussed with Dr. Patel soon after the resident saw the patient.  We reviewed the resident's history and exam and pertinent patient test results.  I agree with the assessment, diagnosis, and plan of care documented in the resident's note. 

## 2017-02-06 MED FILL — ATORVASTATIN 40 MG TABLET: 40 | 30 days supply | Qty: 30 | Fill #3

## 2017-02-20 MED FILL — NOVOLOG FLEXPEN SYRINGE: 100 | 25 days supply | Qty: 15 | Fill #1

## 2017-02-24 LAB — HEMOGLOBIN A1C: Hemoglobin A1C: 8.1

## 2017-02-28 ENCOUNTER — Encounter: Payer: Medicare Other | Attending: Physical Medicine & Rehabilitation | Admitting: Physical Medicine & Rehabilitation

## 2017-02-28 ENCOUNTER — Encounter: Payer: Self-pay | Admitting: Physical Medicine & Rehabilitation

## 2017-02-28 DIAGNOSIS — N186 End stage renal disease: Secondary | ICD-10-CM | POA: Diagnosis not present

## 2017-02-28 DIAGNOSIS — I120 Hypertensive chronic kidney disease with stage 5 chronic kidney disease or end stage renal disease: Secondary | ICD-10-CM | POA: Insufficient documentation

## 2017-02-28 DIAGNOSIS — M1711 Unilateral primary osteoarthritis, right knee: Secondary | ICD-10-CM

## 2017-02-28 DIAGNOSIS — H3320 Serous retinal detachment, unspecified eye: Secondary | ICD-10-CM | POA: Insufficient documentation

## 2017-02-28 DIAGNOSIS — G9520 Unspecified cord compression: Secondary | ICD-10-CM | POA: Diagnosis not present

## 2017-02-28 DIAGNOSIS — R200 Anesthesia of skin: Secondary | ICD-10-CM | POA: Diagnosis not present

## 2017-02-28 DIAGNOSIS — Z833 Family history of diabetes mellitus: Secondary | ICD-10-CM | POA: Diagnosis not present

## 2017-02-28 DIAGNOSIS — Z794 Long term (current) use of insulin: Secondary | ICD-10-CM | POA: Insufficient documentation

## 2017-02-28 DIAGNOSIS — G4730 Sleep apnea, unspecified: Secondary | ICD-10-CM | POA: Insufficient documentation

## 2017-02-28 DIAGNOSIS — M546 Pain in thoracic spine: Secondary | ICD-10-CM

## 2017-02-28 DIAGNOSIS — M4644 Discitis, unspecified, thoracic region: Secondary | ICD-10-CM | POA: Diagnosis not present

## 2017-02-28 DIAGNOSIS — G128 Other spinal muscular atrophies and related syndromes: Secondary | ICD-10-CM | POA: Diagnosis not present

## 2017-02-28 DIAGNOSIS — Z79891 Long term (current) use of opiate analgesic: Secondary | ICD-10-CM | POA: Insufficient documentation

## 2017-02-28 DIAGNOSIS — E119 Type 2 diabetes mellitus without complications: Secondary | ICD-10-CM | POA: Insufficient documentation

## 2017-02-28 DIAGNOSIS — Z79899 Other long term (current) drug therapy: Secondary | ICD-10-CM

## 2017-02-28 DIAGNOSIS — R202 Paresthesia of skin: Secondary | ICD-10-CM | POA: Diagnosis not present

## 2017-02-28 DIAGNOSIS — Z992 Dependence on renal dialysis: Secondary | ICD-10-CM | POA: Insufficient documentation

## 2017-02-28 DIAGNOSIS — Z5181 Encounter for therapeutic drug level monitoring: Secondary | ICD-10-CM

## 2017-02-28 MED ORDER — OXYCODONE HCL 10 MG PO TABS: 10.0000 mg | ORAL_TABLET | Freq: Three times a day (TID) | ORAL | 0 refills | Status: DC | PRN

## 2017-02-28 NOTE — Progress Notes (Signed)
Subjective:    [name], male    DOB: Mar 21, 1966, 51 y.o.   MRN: [id number]  HPI   Seth [name] is here in follow up of his thoracic myelopathy. He continues to work hard on his home program. He works out twice a week and walks daily. He is noticing improvements in his back strength and balance. He no longer needs a device for walking.   Pain is generally in his back but he also has chronic right knee pain.  He is uses oxycodone for breakthrough pain.   He stays regular with his bowels and is on HD 3x per week.    Pain Inventory Average Pain 7 Pain Right Now 7 My pain is sharp, burning, dull, tingling and aching  In the last 24 hours, has pain interfered with the following? General activity 7 Relation with others 9 Enjoyment of life 9 What TIME of day is your pain at its worst? all Sleep (in general) Fair  Pain is worse with: walking, bending, standing and some activites Pain improves with: rest and medication Relief from Meds: 5  Mobility walk without assistance ability to climb steps?  no do you drive?  no  Function disabled: date disabled .  Neuro/Psych No problems in this area  Prior Studies Any changes since last visit?  no  Physicians involved in your care Any changes since last visit?  no   Family History  Problem Relation Age of Onset  . Diabetes Mother   . Diabetes Sister   . Diabetes Brother    Social History   Social History  . Marital status: Single    Spouse name: N/A  . Number of children: N/A  . Years of education: N/A   Social History Main Topics  . Smoking status: Never Smoker  . Smokeless tobacco: Never Used  . Alcohol use No  . Drug use: Yes    Types: Marijuana     Comment: ocassionaly   . Sexual activity: Not Currently   Other Topics Concern  . 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  Divorce with 5 children, 3 boys and 2 girls.  Lives in [place].    Past Surgical History:  Procedure Laterality Date  . AV FISTULA PLACEMENT  05/03/2012   Procedure: ARTERIOVENOUS (AV) FISTULA CREATION;  Surgeon: Mal [name], MD;  Location: [facility];  Service: Vascular;  Laterality: Right;  . TEE WITHOUT CARDIOVERSION N/A 07/10/2015   Procedure: TRANSESOPHAGEAL ECHOCARDIOGRAM (TEE);  Surgeon: Josue Hector, MD;  Location: [facility];  Service: Cardiovascular;  Laterality: N/A;  . TRACHEOSTOMY TUBE PLACEMENT N/A 08/20/2013   Procedure: TRACHEOSTOMY Revision;  Surgeon: Melida Quitter, MD;  Location: [facility];  Service: ENT;  Laterality: N/A;   Past Medical History:  Diagnosis Date  . Diabetes mellitus    INSULIN DEPENDENT DIABETES  . 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 ESRD (end stage renal disease) on dialysis ([place])   . Hypertension   . 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 Sleep apnea    USES CPAP   BP (!) 151/92 (BP Location: Left Wrist, Patient Position: Sitting, Cuff Size: Normal)   Pulse 76   Resp 14   SpO2 96%   Opioid Risk Score:  Fall Risk Score:  `1  Depression screen PHQ 2/9  Depression screen [facility] 2/9 01/31/2017 11/09/2016 06/07/2016 03/22/2016 01/13/2016 01/01/2016 11/03/2015  Decreased Interest 0 0 0 0 0 1 1  Down, Depressed, Hopeless 0 0 0 0 0 1 1  PHQ - 2 Score 0 0 0 0 0 2 2  Altered sleeping - - - - - - 3  Tired, decreased energy - - - - - - 0  Change in appetite - - - - - - 0  Feeling bad or failure about yourself  - - - - - - 1  Trouble concentrating - - - - - - 0  Moving slowly or fidgety/restless - - - - - - 0  Suicidal thoughts - - - - - - 0  PHQ-9 Score - - - - - - 6  Difficult doing work/chores - - - - - - Somewhat difficult    Review of Systems  Constitutional: Negative.   HENT: Negative.   Eyes: Negative.   Respiratory: Negative.   Cardiovascular: Negative.     Gastrointestinal: Negative.   Endocrine: Negative.   Genitourinary: Negative.   Musculoskeletal: Positive for arthralgias and back pain.  Skin: Negative.   Allergic/Immunologic: Negative.   Neurological: Negative.   Hematological: Negative.   Psychiatric/Behavioral: Negative.   All other systems reviewed and are negative.      Objective:   Physical Exam  Constitutional: He is oriented to person, place, and time. He appears well-developed and well-nourished.  Morbidly obese male--weight stable.  HENT:  Head: Normocephalic and atraumatic.  Eyes: Conjunctivae and EOM are normal. Pupils are equal, round, and reactive to light.  Neck: Normal range of motion. Neck supple.  Cardiovascular: RRR Respiratory: cta b.  GI: Soft. Bowel sounds are normal. He exhibits no distension. There is no tenderness.   Musculoskeletal: gait wide based. Strength stable. Knee tender but has full rom. Mild antalgia RLE.  Neuro: Strength B/l UE 5/5 throughout LLE hip flexion 5/5, ankle dorsi/plantar flexion 5/5 RLE: hip flexion 5/5, ankle dorsi/plantar flexion 5/5 He is alert and oriented to person, place, and time.  Speech clear.  Skin: Skin is warm and dry.    Psychiatric: He has a normal mood and affect. His speech is normal. Thought content normal. Cognition and memory are normal.    Assessment & Plan:  Medical Problem List and Plan: 1. 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 Pain Management:  -oxycodone 10mg ,   90 pills per month, q8 prn. We will continue the opioid monitoring program, this consists of regular clinic visits, examinations, urine drug screen, pill counts as well as use of [place] Controlled Substance Reporting System. [place] was reviewed today.   -ORAL SWAB for drug testing today   -ice prn, heat, topical ointments -encouraged ongoing weight loss  3. ESRD:HD per nephrology  4. HTN: per primary 5.. DM type 2: SSI for elevated BS.  6. OSA: Continue use of CPAP.   15 minutes of face to face patient care time were spent during this visit. All questions were encouraged and answered. Follow up with me or NP in about 1 month. .Greater than 50% of time during this encounter was spent counseling patient/family in regard to pain mgt strategies, diet. Marland Kitchen

## 2017-02-28 NOTE — Patient Instructions (Signed)
PLEASE FEEL FREE TO CALL OUR OFFICE WITH ANY PROBLEMS OR QUESTIONS ([phone])    DON'T FORGET TO USE ICE AND HEAT TO HELP WITH YOUR PAIN, TYLENOL TOO.  [facility]

## 2017-03-03 ENCOUNTER — Encounter: Payer: Self-pay | Admitting: Nurse Practitioner

## 2017-03-05 LAB — DRUG TOX MONITOR 1 W/CONF, ORAL FLD
Amphetamines: NEGATIVE ng/mL (ref <10)
BARBITURATES: NEGATIVE ng/mL (ref <10)
Benzodiazepines: NEGATIVE ng/mL (ref <0.50)
Buprenorphine: NEGATIVE ng/mL (ref <0.025)
Cocaine: NEGATIVE ng/mL (ref <2.5)
Fentanyl: NEGATIVE ng/mL (ref <0.10)
HEROIN METABOLITE: NEGATIVE ng/mL (ref <1.0)
MDMA: NEGATIVE ng/mL (ref <10)
MEPROBAMATE: NEGATIVE ng/mL (ref <2.5)
Marijuana: POSITIVE ng/mL — AB (ref <2.5)
Meperidine: NEGATIVE ng/mL (ref <5.0)
Methadone: NEGATIVE ng/mL (ref <5.0)
Nicotine Metabolite: NEGATIVE ng/mL (ref <5.0)
Opiates: NEGATIVE ng/mL (ref <2.5)
PROPOXYPHENE: NEGATIVE ng/mL (ref <5.0)
Phencyclidine: NEGATIVE ng/mL (ref <10)
THC: 118.6 ng/mL — ABNORMAL HIGH (ref <2.5)
Tapentadol: NEGATIVE ng/mL (ref <5.0)
Tramadol: NEGATIVE ng/mL (ref <5.0)
Zolpidem: NEGATIVE ng/mL (ref <5.0)

## 2017-03-05 LAB — DRUG TOX ALC METAB W/CON, ORAL FLD: ALCOHOL METABOLITE: NEGATIVE ng/mL (ref <25)

## 2017-03-08 ENCOUNTER — Telehealth: Payer: Self-pay | Admitting: *Deleted

## 2017-03-08 DIAGNOSIS — M546 Pain in thoracic spine: Secondary | ICD-10-CM

## 2017-03-08 DIAGNOSIS — G9520 Unspecified cord compression: Secondary | ICD-10-CM

## 2017-03-08 DIAGNOSIS — M1711 Unilateral primary osteoarthritis, right knee: Secondary | ICD-10-CM

## 2017-03-08 NOTE — Telephone Encounter (Signed)
Oral drug screen for this encounter is negative for oxycodone even though he reported his last dose was 02/28/17 on the day of the test.  It is also positive for marijuana.

## 2017-03-08 NOTE — Telephone Encounter (Signed)
Non-narcotic management from here on. I can offer him a few oxycodone to transition.

## 2017-03-09 ENCOUNTER — Encounter: Payer: Medicare Other | Admitting: Gastroenterology

## 2017-03-09 ENCOUNTER — Ambulatory Visit: Payer: Medicare Other | Admitting: Nurse Practitioner

## 2017-03-09 NOTE — Telephone Encounter (Signed)
Must define "a few".

## 2017-03-09 NOTE — Telephone Encounter (Signed)
30

## 2017-03-10 MED ORDER — OXYCODONE HCL 10 MG PO TABS: 10.0000 mg | ORAL_TABLET | Freq: Three times a day (TID) | ORAL | 0 refills | Status: DC | PRN

## 2017-03-10 NOTE — Telephone Encounter (Signed)
I spoke with Mr Seth [name] and he says his wife does smoke and he does kiss and make love to his wife.  I explained that second hand smoke does not create a positive drug test for marijuana. He went on about the body fluids but I reinforced that at present marijuana is an illegal drug in [place] and therefore Dr Seth [name] is going to treat him non narcotic only. If he does not want to continue that will be his prerogative.  He does want to continue,  He states that he does take his oxycodone but I told him it was not in his system or any metabolites. Dr Seth [name] will give him a final amount of #30.  His appt is with Seth [name] 03/28/17 so she will issue the wean down script at that time. The oxycodone RX was reordered to reflect the new dispense and final Rx. Seth [name] will print at appt.

## 2017-03-16 ENCOUNTER — Ambulatory Visit: Payer: Medicare Other | Admitting: Nurse Practitioner

## 2017-03-17 MED FILL — ATORVASTATIN 40 MG TABLET: 40 | 30 days supply | Qty: 30 | Fill #4

## 2017-03-23 ENCOUNTER — Ambulatory Visit: Payer: Medicare Other | Admitting: Physician Assistant

## 2017-03-28 ENCOUNTER — Encounter: Payer: Medicare Other | Attending: Physical Medicine & Rehabilitation | Admitting: Registered Nurse

## 2017-03-28 ENCOUNTER — Encounter: Payer: Self-pay | Admitting: Registered Nurse

## 2017-03-28 ENCOUNTER — Telehealth: Payer: Self-pay | Admitting: Registered Nurse

## 2017-03-28 VITALS — BP 134/87 | HR 83

## 2017-03-28 DIAGNOSIS — R200 Anesthesia of skin: Secondary | ICD-10-CM | POA: Diagnosis not present

## 2017-03-28 DIAGNOSIS — G894 Chronic pain syndrome: Secondary | ICD-10-CM

## 2017-03-28 DIAGNOSIS — G4730 Sleep apnea, unspecified: Secondary | ICD-10-CM | POA: Insufficient documentation

## 2017-03-28 DIAGNOSIS — M1711 Unilateral primary osteoarthritis, right knee: Secondary | ICD-10-CM

## 2017-03-28 DIAGNOSIS — Z5181 Encounter for therapeutic drug level monitoring: Secondary | ICD-10-CM | POA: Diagnosis not present

## 2017-03-28 DIAGNOSIS — H3320 Serous retinal detachment, unspecified eye: Secondary | ICD-10-CM | POA: Diagnosis not present

## 2017-03-28 DIAGNOSIS — G128 Other spinal muscular atrophies and related syndromes: Secondary | ICD-10-CM | POA: Diagnosis not present

## 2017-03-28 DIAGNOSIS — G9520 Unspecified cord compression: Secondary | ICD-10-CM

## 2017-03-28 DIAGNOSIS — N186 End stage renal disease: Secondary | ICD-10-CM | POA: Diagnosis not present

## 2017-03-28 DIAGNOSIS — Z79891 Long term (current) use of opiate analgesic: Secondary | ICD-10-CM | POA: Diagnosis not present

## 2017-03-28 DIAGNOSIS — M546 Pain in thoracic spine: Secondary | ICD-10-CM | POA: Diagnosis not present

## 2017-03-28 DIAGNOSIS — Z992 Dependence on renal dialysis: Secondary | ICD-10-CM | POA: Insufficient documentation

## 2017-03-28 DIAGNOSIS — Z833 Family history of diabetes mellitus: Secondary | ICD-10-CM | POA: Diagnosis not present

## 2017-03-28 DIAGNOSIS — Z794 Long term (current) use of insulin: Secondary | ICD-10-CM | POA: Diagnosis not present

## 2017-03-28 DIAGNOSIS — E119 Type 2 diabetes mellitus without complications: Secondary | ICD-10-CM | POA: Diagnosis not present

## 2017-03-28 DIAGNOSIS — Z79899 Other long term (current) drug therapy: Secondary | ICD-10-CM | POA: Diagnosis not present

## 2017-03-28 DIAGNOSIS — I120 Hypertensive chronic kidney disease with stage 5 chronic kidney disease or end stage renal disease: Secondary | ICD-10-CM | POA: Diagnosis not present

## 2017-03-28 DIAGNOSIS — R202 Paresthesia of skin: Secondary | ICD-10-CM | POA: Diagnosis not present

## 2017-03-28 DIAGNOSIS — M4644 Discitis, unspecified, thoracic region: Secondary | ICD-10-CM | POA: Diagnosis not present

## 2017-03-28 MED ORDER — OXYCODONE HCL 10 MG PO TABS: 10.0000 mg | ORAL_TABLET | Freq: Three times a day (TID) | ORAL | 0 refills | Status: DC | PRN

## 2017-03-28 NOTE — Progress Notes (Signed)
Subjective:    [name], male    DOB: April 30, 1966, 51 y.o.   MRN: [id number]  HPI: [name] is a 51 year old male who returns for follow up appointment and medication refill. He states his pain is located in his mid- back mainly left side. He rates his pain 7. His current exercise regime is performing stretching exercises,walking and attending the [place] twice a week.   Last Drug Screen was on 02/28/2017 inconsistent, positive for marijuana.  Today is [name] last appointment in our office, received his wean prescription today. He didn't bring in his Oxycodone, according to [facility] he picked up his Oxycodone on 03/03/2017.  He's on Hemodialysis three days a week, Monday, Wednesday and Friday.  Past Medical History: Cord Compression Myelopathy Past Surgical History: T-1- T-7 Posterior Lateral Fusion with Pedicule screw instrumentation via Dr. Annette Stable.   Pain Inventory Average Pain 7 Pain Right Now 7 My pain is sharp, dull, tingling and aching  In the last 24 hours, has pain interfered with the following? General activity 8 Relation with others 10 Enjoyment of life 10 What TIME of day is your pain at its worst? n/a Sleep (in general) NA  Pain is worse with: walking, bending and standing Pain improves with: rest and medication Relief from Meds: n/a  Mobility walk without assistance ability to climb steps?  no  Function Do you have any goals in this area?  no  Neuro/Psych No problems in this area  Prior Studies Any changes since last visit?  no  Physicians involved in your care Any changes since last visit?  no   Family History  Problem Relation Age of Onset  . Diabetes Mother   . Diabetes Sister   . Diabetes Brother    Social History   Social History  . Marital status: Single    Spouse name: N/A  . Number of children: N/A  . Years of education: N/A   Social History Main Topics  . Smoking status: Never Smoker  .  Smokeless tobacco: Never Used  . Alcohol use No  . Drug use: Yes    Types: Marijuana     Comment: ocassionaly   . Sexual activity: Not Currently   Other Topics Concern  . 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  Divorce with 5 children, 3 boys and 2 girls.  Lives in [place].    Past Surgical History:  Procedure Laterality Date  . AV FISTULA PLACEMENT  05/03/2012   Procedure: ARTERIOVENOUS (AV) FISTULA CREATION;  Surgeon: Mal [name], MD;  Location: [place];  Service: Vascular;  Laterality: Right;  . TEE WITHOUT CARDIOVERSION N/A 07/10/2015   Procedure: TRANSESOPHAGEAL ECHOCARDIOGRAM (TEE);  Surgeon: Josue Hector, MD;  Location: [place];  Service: Cardiovascular;  Laterality: N/A;  . TRACHEOSTOMY TUBE PLACEMENT N/A 08/20/2013   Procedure: TRACHEOSTOMY Revision;  Surgeon: Melida Quitter, MD;  Location: [place];  Service: ENT;  Laterality: N/A;   Past Medical History:  Diagnosis Date  . Diabetes mellitus    INSULIN DEPENDENT DIABETES  . 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 ESRD (end stage renal disease) on dialysis ([place])   . Hypertension   . 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 Sleep  apnea    USES CPAP   BP 134/87   Pulse 83   SpO2 95%   Opioid Risk Score:  0 Fall Risk Score:  `1  Depression screen PHQ 2/9  Depression screen [facility] 2/9 03/28/2017 01/31/2017 11/09/2016 06/07/2016 03/22/2016 01/13/2016 01/01/2016  Decreased Interest 0 0 0 0 0 0 1  Down, Depressed, Hopeless 0 0 0 0 0 0 1  PHQ - 2 Score 0 0 0 0 0 0 2  Altered sleeping - - - - - - -  Tired, decreased energy - - - - - - -  Change in appetite - - - - - - -  Feeling bad or failure about yourself  - - - - - - -  Trouble concentrating - - - - - - -  Moving slowly or fidgety/restless - - - - - - -  Suicidal  thoughts - - - - - - -  PHQ-9 Score - - - - - - -  Difficult doing work/chores - - - - - - -    Review of Systems  Constitutional: Negative.   HENT: Negative.   Eyes: Negative.   Respiratory: Negative.   Cardiovascular: Negative.   Gastrointestinal: Negative.   Endocrine: Negative.   Genitourinary: Negative.   Musculoskeletal: Negative.   Skin: Negative.   Allergic/Immunologic: Negative.   Neurological: Negative.   Hematological: Negative.   Psychiatric/Behavioral: Negative.   All other systems reviewed and are negative.      Objective:   Physical Exam  Constitutional: He is oriented to person, place, and time. He appears well-developed and well-nourished.  HENT:  Head: Normocephalic and atraumatic.  Neck: Normal range of motion. Neck supple.  Cardiovascular: Normal rate and regular rhythm.   Pulmonary/Chest: Effort normal and breath sounds normal.  Musculoskeletal:  Normal Muscle Bulk and Muscle Testing Reveals: Upper Extremities: Full ROM and Muscle Strength 5/5 Thoracic Paraspinal Tenderness: T-5-T-7 Lower Extremities: Full ROM and Muscle Strength 5/5 Arises from Table with ease Narrow Based Gait  Neurological: He is alert and oriented to person, place, and time.  Skin: Skin is warm and dry.  Psychiatric: He has a normal mood and affect.  Nursing note and vitals reviewed.         Assessment & Plan:  1. 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 03/28/2017 2. Pain Management: Refilled: Oxycodone 10 mg one tablet daily as needed #30. 03/28/2017 We will continue the opioid monitoring program, this consists of regular clinic visits, examinations, urine drug screen, pill counts as well as use of [place] Controlled Substance Reporting System.  Continue with Ice Therapy and to increase Activity as tolerated.  3. ESRD: On Hemodialysis: Nephrology  Following. 03/28/2017 4.. OA of Right Knee: Continue HEP and Continue to Monitor  20 minutes of face to face patient care time was spent during this visit. All questions were encouraged and answered.     F/U in 54month

## 2017-03-28 NOTE — Telephone Encounter (Signed)
Placed a call to Mr. Strycharz his medication Lorin Picket was found on the floor, He would like to pick them up.  On 03/28/2017 the [place] was reviewed no conflict was seen on the [place] with multiple prescribers. Mr. Bartling has a signed narcotic contract with our office. If there were any discrepancies this would have been reported to his physician.

## 2017-03-30 ENCOUNTER — Ambulatory Visit: Payer: Medicare Other | Admitting: Physician Assistant

## 2017-03-31 NOTE — Addendum Note (Signed)
Addended by: Hulan Fray on: 03/31/2017 12:21 PM   Modules accepted: Orders

## 2017-04-07 ENCOUNTER — Encounter: Payer: Self-pay | Admitting: Dietician

## 2017-04-07 ENCOUNTER — Ambulatory Visit (INDEPENDENT_AMBULATORY_CARE_PROVIDER_SITE_OTHER): Payer: Medicare Other | Admitting: Internal Medicine

## 2017-04-07 DIAGNOSIS — L819 Disorder of pigmentation, unspecified: Secondary | ICD-10-CM | POA: Diagnosis not present

## 2017-04-07 NOTE — Assessment & Plan Note (Signed)
History of present illness Patient reports noticing areas of depigmentation on his face 2 weeks ago after he used hair dye on his beard. Reports having itching and burning in these areas initially which has now resolved. Denies having any pain, itching, or burning now. Denies noticing any change in size. Denies having any fevers or chills.  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 Depigmented macules noted on the face. No hair loss, erythema, or scaling in these areas. Please see image.  Plan -Advised patient to stop using hair dye. Explained to him he might get pigmentation back in these areas but there is always the possibility of the depigmentation being permanent. -Advised him to return to the clinic in 4 weeks or sooner if he notices the areas of depigmentation getting larger in size.

## 2017-04-07 NOTE — Progress Notes (Signed)
Patient reports that he was recently at an eye doctor at the [facility] in [place] where he saw Dr. Ricki Miller.

## 2017-04-07 NOTE — Progress Notes (Signed)
   CC: Patient is here to discuss skin depigmentation he noticed on his face.  HPI:  [name] is a 51 y.o. male with a past medical history of conditions listed below presenting to the clinic to discuss skin depigmentation he noticed on his face. Please see problem based charting for the status of the patient's current and chronic medical conditions.   Past Medical History:  Diagnosis Date  . Diabetes mellitus    INSULIN DEPENDENT DIABETES  . 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 ESRD (end stage renal disease) on dialysis ([place])   . Hypertension   . 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 Sleep apnea    USES CPAP   Review of Systems: Pertinent positives mentioned in HPI. Remainder of all ROS negative.   Physical Exam:  Vitals:   04/07/17 1411  BP: (!) 113/95  Pulse: 91  Temp: 97.8 F (36.6 C)  SpO2: 97%  Weight: (!) 305 lb 14.4 oz (138.8 kg)   Physical Exam  Constitutional: He is oriented to person, place, and time. He appears well-developed and well-nourished. No distress.  HENT:  Head: Normocephalic and atraumatic.  Eyes: Right eye exhibits no discharge. Left eye exhibits no discharge.  Cardiovascular: Normal rate, regular rhythm and intact distal pulses.   Pulmonary/Chest: Effort normal and breath sounds normal. No respiratory distress. He has no wheezes. He has no rales.  Abdominal: Soft. Bowel sounds are normal. He exhibits no distension. There is no tenderness.  Musculoskeletal: He exhibits no edema.  Neurological: He is alert and oriented to person, place, and time.  Skin: Skin is warm and dry.  Depigmented macules on the face. No hair loss, erythema, or scaling noted in these areas. Please see image.      Assessment & Plan:   See Encounters Tab for problem based charting.  Patient discussed with Dr. Daryll Drown

## 2017-04-07 NOTE — Patient Instructions (Signed)
Seth [name] it was nice meeting you today.  -Do not use hair dye  -Return to the clinic in 1 month for a follow-up. If the area of depigmentation (light colored) on your skin starts becoming bigger in size, please return to the clinic sooner.

## 2017-04-10 ENCOUNTER — Telehealth: Payer: Self-pay | Admitting: Dietician

## 2017-04-10 NOTE — Progress Notes (Signed)
Internal Medicine Clinic Attending  Case discussed with Dr. Marlowe Sax at the time of the visit.  We reviewed the resident's history and exam and pertinent patient test results.  I agree with the assessment, diagnosis, and plan of care documented in the resident's note.  There is a small risk this could represent a malignancy, so re-evaluation is reasonable.

## 2017-04-11 NOTE — Telephone Encounter (Signed)
Spoke with patient who is willing to sign a HIPPA release form for [place] to be able to request his eye exan results from the [facility] when he comes for his appointment in September.

## 2017-04-28 MED FILL — ATORVASTATIN 40 MG TABLET: 40 | 30 days supply | Qty: 30 | Fill #5

## 2017-05-09 MED FILL — NOVOLOG FLEXPEN SYRINGE: 100 | 25 days supply | Qty: 15 | Fill #2

## 2017-06-02 MED FILL — ATORVASTATIN 40 MG TABLET: 40 | 30 days supply | Qty: 30 | Fill #6

## 2017-06-02 MED FILL — AMLODIPINE BESYLATE 10 MG T: 10 | 90 days supply | Qty: 180 | Fill #1

## 2017-06-13 DIAGNOSIS — N186 End stage renal disease: Secondary | ICD-10-CM | POA: Diagnosis not present

## 2017-06-13 DIAGNOSIS — I871 Compression of vein: Secondary | ICD-10-CM | POA: Diagnosis not present

## 2017-06-13 DIAGNOSIS — Z992 Dependence on renal dialysis: Secondary | ICD-10-CM | POA: Diagnosis not present

## 2017-06-14 ENCOUNTER — Encounter: Payer: Medicare Other | Admitting: Internal Medicine

## 2017-07-07 MED FILL — NOVOLOG FLEXPEN SYRINGE: 100 | 25 days supply | Qty: 15 | Fill #3

## 2017-07-07 MED FILL — ATORVASTATIN 40 MG TABLET: 40 | 30 days supply | Qty: 30 | Fill #7

## 2017-07-19 ENCOUNTER — Encounter: Payer: Self-pay | Admitting: Internal Medicine

## 2017-07-19 ENCOUNTER — Ambulatory Visit (INDEPENDENT_AMBULATORY_CARE_PROVIDER_SITE_OTHER): Payer: Medicare Other | Admitting: Internal Medicine

## 2017-07-19 VITALS — BP 110/61 | HR 84 | Temp 98.1°F | Ht 68.0 in | Wt 307.7 lb

## 2017-07-19 DIAGNOSIS — Z9889 Other specified postprocedural states: Secondary | ICD-10-CM

## 2017-07-19 DIAGNOSIS — E1122 Type 2 diabetes mellitus with diabetic chronic kidney disease: Secondary | ICD-10-CM | POA: Diagnosis not present

## 2017-07-19 DIAGNOSIS — E669 Obesity, unspecified: Secondary | ICD-10-CM | POA: Diagnosis not present

## 2017-07-19 DIAGNOSIS — Z6841 Body Mass Index (BMI) 40.0 and over, adult: Secondary | ICD-10-CM

## 2017-07-19 DIAGNOSIS — I120 Hypertensive chronic kidney disease with stage 5 chronic kidney disease or end stage renal disease: Secondary | ICD-10-CM | POA: Diagnosis not present

## 2017-07-19 DIAGNOSIS — Z794 Long term (current) use of insulin: Secondary | ICD-10-CM | POA: Diagnosis not present

## 2017-07-19 DIAGNOSIS — E1159 Type 2 diabetes mellitus with other circulatory complications: Secondary | ICD-10-CM

## 2017-07-19 DIAGNOSIS — Z992 Dependence on renal dialysis: Secondary | ICD-10-CM

## 2017-07-19 DIAGNOSIS — M5104 Intervertebral disc disorders with myelopathy, thoracic region: Secondary | ICD-10-CM

## 2017-07-19 DIAGNOSIS — I10 Essential (primary) hypertension: Secondary | ICD-10-CM

## 2017-07-19 DIAGNOSIS — Z0000 Encounter for general adult medical examination without abnormal findings: Secondary | ICD-10-CM

## 2017-07-19 DIAGNOSIS — G9520 Unspecified cord compression: Secondary | ICD-10-CM

## 2017-07-19 DIAGNOSIS — Z79899 Other long term (current) drug therapy: Secondary | ICD-10-CM | POA: Diagnosis not present

## 2017-07-19 DIAGNOSIS — M4324 Fusion of spine, thoracic region: Secondary | ICD-10-CM | POA: Diagnosis not present

## 2017-07-19 DIAGNOSIS — N186 End stage renal disease: Secondary | ICD-10-CM

## 2017-07-19 DIAGNOSIS — I152 Hypertension secondary to endocrine disorders: Secondary | ICD-10-CM

## 2017-07-19 LAB — GLUCOSE, CAPILLARY: Glucose-Capillary: 212 mg/dL — ABNORMAL HIGH (ref 65–99)

## 2017-07-19 LAB — POCT GLYCOSYLATED HEMOGLOBIN (HGB A1C): HEMOGLOBIN A1C: 7.2

## 2017-07-19 MED ORDER — OXYCODONE HCL 5 MG PO TABS: 5.0000 mg | ORAL_TABLET | Freq: Two times a day (BID) | ORAL | 0 refills | Status: DC | PRN

## 2017-07-19 MED ORDER — SENNA 8.6 MG PO TABS: 1.0000 | ORAL_TABLET | Freq: Every day | ORAL | 0 refills | Status: DC

## 2017-07-19 NOTE — Progress Notes (Deleted)
   CC: ***  HPI:  [name] is a 51 y.o.   Past Medical History:  Diagnosis Date  . Diabetes mellitus    INSULIN DEPENDENT DIABETES  . 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 ESRD (end stage renal disease) on dialysis ([place])   . Hypertension   . 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 Sleep apnea    USES CPAP   Review of Systems:  ***  Physical Exam:  Vitals:   07/19/17 1443  BP: 110/61  Pulse: 84  Temp: 98.1 F (36.7 C)  TempSrc: Oral  SpO2: 99%  Weight: (!) 307 lb 11.2 oz (139.6 kg)  Height: 5\' 8"  (1.727 m)   ***  Assessment & Plan:   See Encounters Tab for problem based charting.  Patient discussed with Dr. Evette Doffing
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
## 2017-07-19 NOTE — Patient Instructions (Addendum)
It was a pleasure to see you again Seth [name].  Your A1c is 7.2 today. Please continue your current medications as prescribed.  We will try to help control your pain further with Oxycodone 5 mg every 12 hours as needed.  Please follow up with [place] in 4-6 weeks or sooner if needed.

## 2017-07-20 NOTE — Assessment & Plan Note (Signed)
Patient reports continued stable back pain which is more bothersome now that his pain medications have been weaned off. He says he has not had a significant change or increase in his back pain either over the last 3 months time or acutely. He is not having fevers or chills. He says he is walking on his own and trying to exercise more. His pain is worse when exercising. He has not had bowel/bladder incontinence, fevers, chills, focal weakness, or new sensory changes.  I am not suspecting that his known vertebral disease has progressed or that he has a recurrence of his osteomyelitis at this time to warrant further investigation with MRI imaging of his spine. I do feel that he is going to have continued pain at baseline at that he would benefit from continued pain management to improve his pain in order for him to continue to complete his ADLs and increase his activity to improve his overall health. We will attempt to improve his pain control and start with Oxycodone IR 5 mg every 12 hours only as needed. He is counseled on side effects and advised to follow up with [place] in 4 weeks for reassessment of his pain control. [place] Controlled substances database is reviewed and appears appropriate. - Oxycodone IR 5 mg every 12 hours only as needed #60 Rx printed, signed, and handed to patient - Senokot for bowel regimen - f/u in 4 weeks for reassessment - Low threshold for repeat imaging if significant change in pain/sensation, focal weakness, incontinence, or systemic sign of illness

## 2017-07-20 NOTE — Assessment & Plan Note (Signed)
He is receiving routine immunizations with dialysis. Will try to obtain records for immunization status and eye exams.

## 2017-07-20 NOTE — Assessment & Plan Note (Signed)
BP Readings from Last 3 Encounters:  07/19/17 110/61  04/07/17 (!) 113/95  03/28/17 134/87   BP well controlled. Will continue Amlodipine 10 mg daily.

## 2017-07-20 NOTE — Assessment & Plan Note (Signed)
Repeat A1c is 7.2, improved from 8.1 in April 2018. His diabetic control is improved. Will continue current management and encourage continued effort towards healthy eating patterns and exercise as tolerated. - Continue Glimepiride 2 mg daily - Continue Novolog SSI - f/u 3 months

## 2017-07-21 NOTE — Progress Notes (Signed)
Internal Medicine Clinic Attending  Case discussed with Dr. Patel at the time of the visit.  We reviewed the resident's history and exam and pertinent patient test results.  I agree with the assessment, diagnosis, and plan of care documented in the resident's note.  

## 2017-08-17 MED FILL — ATORVASTATIN 40 MG TABLET: 40 | 30 days supply | Qty: 30 | Fill #8

## 2017-08-28 ENCOUNTER — Other Ambulatory Visit: Payer: Self-pay

## 2017-08-28 DIAGNOSIS — G9520 Unspecified cord compression: Secondary | ICD-10-CM

## 2017-08-28 NOTE — Telephone Encounter (Signed)
oxyCODONE (OXY IR/ROXICODONE) 5 MG immediate release tablet, Refill request.

## 2017-08-29 ENCOUNTER — Other Ambulatory Visit: Payer: Self-pay | Admitting: *Deleted

## 2017-08-29 DIAGNOSIS — G9520 Unspecified cord compression: Secondary | ICD-10-CM

## 2017-08-29 MED ORDER — OXYCODONE HCL 5 MG PO TABS: 5.0000 mg | ORAL_TABLET | Freq: Two times a day (BID) | ORAL | 0 refills | Status: DC | PRN

## 2017-08-29 NOTE — Telephone Encounter (Signed)
This has been approved and pt must keep appt for future considerations

## 2017-09-01 MED FILL — NOVOLOG FLEXPEN SYRINGE: 100 | 25 days supply | Qty: 15 | Fill #4

## 2017-09-25 ENCOUNTER — Encounter (INDEPENDENT_AMBULATORY_CARE_PROVIDER_SITE_OTHER): Payer: Self-pay

## 2017-09-26 MED FILL — ATORVASTATIN 40 MG TABLET: 40 | 30 days supply | Qty: 30 | Fill #9

## 2017-09-27 ENCOUNTER — Other Ambulatory Visit (INDEPENDENT_AMBULATORY_CARE_PROVIDER_SITE_OTHER): Payer: Self-pay | Admitting: Vascular Surgery

## 2017-10-05 MED ORDER — CEFAZOLIN SODIUM-DEXTROSE 1-4 GM/50ML-% IV SOLN: 1.0000 g | Freq: Once | INTRAVENOUS | Status: DC

## 2017-10-06 ENCOUNTER — Ambulatory Visit: Admission: RE | Admit: 2017-10-06 | Payer: Medicare Other | Source: Ambulatory Visit | Admitting: Vascular Surgery

## 2017-10-06 ENCOUNTER — Encounter: Admission: RE | Payer: Self-pay | Source: Ambulatory Visit

## 2017-10-06 SURGERY — A/V FISTULAGRAM
Anesthesia: Moderate Sedation | Laterality: Right

## 2017-10-06 MED FILL — UNIFINE PENTIPS 31GX3/16": 31G X 5 MM | 30 days supply | Qty: 100 | Fill #1

## 2017-10-06 MED FILL — UNIFINE PENTIPS 31GX3/16: 31G X 5 MM | 30 days supply | Qty: 100 | Fill #1

## 2017-10-11 ENCOUNTER — Ambulatory Visit (INDEPENDENT_AMBULATORY_CARE_PROVIDER_SITE_OTHER): Payer: Medicare Other | Admitting: Internal Medicine

## 2017-10-11 ENCOUNTER — Encounter: Payer: Self-pay | Admitting: Internal Medicine

## 2017-10-11 ENCOUNTER — Other Ambulatory Visit: Payer: Self-pay

## 2017-10-11 VITALS — BP 113/61 | HR 90 | Temp 98.0°F | Ht 68.0 in | Wt 312.5 lb

## 2017-10-11 DIAGNOSIS — E1159 Type 2 diabetes mellitus with other circulatory complications: Secondary | ICD-10-CM

## 2017-10-11 DIAGNOSIS — E1169 Type 2 diabetes mellitus with other specified complication: Secondary | ICD-10-CM

## 2017-10-11 DIAGNOSIS — E669 Obesity, unspecified: Secondary | ICD-10-CM | POA: Diagnosis not present

## 2017-10-11 DIAGNOSIS — E785 Hyperlipidemia, unspecified: Secondary | ICD-10-CM | POA: Diagnosis not present

## 2017-10-11 DIAGNOSIS — Z6841 Body Mass Index (BMI) 40.0 and over, adult: Secondary | ICD-10-CM | POA: Diagnosis not present

## 2017-10-11 DIAGNOSIS — Z8739 Personal history of other diseases of the musculoskeletal system and connective tissue: Secondary | ICD-10-CM | POA: Diagnosis not present

## 2017-10-11 DIAGNOSIS — Z992 Dependence on renal dialysis: Secondary | ICD-10-CM

## 2017-10-11 DIAGNOSIS — Z79899 Other long term (current) drug therapy: Secondary | ICD-10-CM | POA: Diagnosis not present

## 2017-10-11 DIAGNOSIS — M4644 Discitis, unspecified, thoracic region: Secondary | ICD-10-CM | POA: Diagnosis not present

## 2017-10-11 DIAGNOSIS — G9520 Unspecified cord compression: Secondary | ICD-10-CM

## 2017-10-11 DIAGNOSIS — Z981 Arthrodesis status: Secondary | ICD-10-CM

## 2017-10-11 DIAGNOSIS — Z794 Long term (current) use of insulin: Secondary | ICD-10-CM

## 2017-10-11 DIAGNOSIS — G8929 Other chronic pain: Secondary | ICD-10-CM | POA: Diagnosis not present

## 2017-10-11 DIAGNOSIS — N186 End stage renal disease: Secondary | ICD-10-CM | POA: Diagnosis not present

## 2017-10-11 DIAGNOSIS — I10 Essential (primary) hypertension: Secondary | ICD-10-CM

## 2017-10-11 DIAGNOSIS — Z79891 Long term (current) use of opiate analgesic: Secondary | ICD-10-CM

## 2017-10-11 DIAGNOSIS — I120 Hypertensive chronic kidney disease with stage 5 chronic kidney disease or end stage renal disease: Secondary | ICD-10-CM

## 2017-10-11 DIAGNOSIS — E1122 Type 2 diabetes mellitus with diabetic chronic kidney disease: Secondary | ICD-10-CM | POA: Diagnosis not present

## 2017-10-11 LAB — POCT GLYCOSYLATED HEMOGLOBIN (HGB A1C): HEMOGLOBIN A1C: 7.9

## 2017-10-11 LAB — GLUCOSE, CAPILLARY: GLUCOSE-CAPILLARY: 184 mg/dL — AB (ref 65–99)

## 2017-10-11 MED ORDER — OXYCODONE HCL 5 MG PO TABS: 5.0000 mg | ORAL_TABLET | Freq: Two times a day (BID) | ORAL | 0 refills | Status: DC | PRN

## 2017-10-11 MED ORDER — ATORVASTATIN CALCIUM 40 MG PO TABS: 40.0000 mg | ORAL_TABLET | Freq: Every day | ORAL | 3 refills | Status: DC

## 2017-10-11 NOTE — Assessment & Plan Note (Signed)
He has chronic back pain secondary to a history of thoracic vertebral osteomyelitis now s/p laminectomy and T1-T7 fusion. He has been taking Oxycodone IR 5 mg q12h prn for pain which he says does provide benefit and allows him to complete his daily activities and attend his childrens' school events. He is walking on his own but does have continued trouble going up stairs. He denies any change in his pain, incontinence, fevers, chills, focal weakness, or new sensory changes.  A/P: His back pain is currently stable without alarm symptoms. I have refilled his pain medicine as it seems to allow him to complete his ADLs and increase social activity without side effects. The benefits outweight the risks at this time. The [place] Controlled substance database is reviewed and appears appropriate. - Refilled Oxycodone IR 5 mg q12h prn #60/month - Will establish pain contract on follow up if pain remains controlled on this plan

## 2017-10-11 NOTE — Assessment & Plan Note (Signed)
He reports adherence to Amlodipine 10 mg daily. BP Readings from Last 3 Encounters:  10/11/17 113/61  07/19/17 110/61  04/07/17 (!) 113/95   A/P: His BP is well controlled. Will continue Amlodipine 10 mg daily.

## 2017-10-11 NOTE — Assessment & Plan Note (Addendum)
His last A1c was 7.2 on 07/19/17. He has been taking Glimepiride 2 mg daily and Novolog sliding scale insulin three times daily with meals. He did not bring his meter this visit. He reports checking his blood sugars three times daily before meals with most readings below 200. He denies any hypoglycemic or hyperglycemic symptoms. He admits to dietary indiscretion over the holidays. He says he has his eye exams completed at the [facility] in [place], [place]. A/P: Repeat A1c this visit is 7.9. We will continue current management for now however should consider adding basal insulin on next visit and discontinuing Glimepiride due to risk for hypoglycemia without adequate benefit in the setting of dialysis.
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
## 2017-10-11 NOTE — Assessment & Plan Note (Signed)
He reports tolerating HD well and continues with a MWF schedule. He did go to HD this morning. He prefers the current schedule as it allows him to be free on weekends to attend his childrens' extracurricular events. He receives dialysis via a RUE AVF. A/P: Tolerating HD well. Continue HD per Nephrology.

## 2017-10-11 NOTE — Assessment & Plan Note (Signed)
He was previously on Atorvastatin however reports not taking it now and he is not sure why. He did tolerate it well in the past. A/P: A high intensity statin is indicated due to an elevated 10 year ASCVD risk in the setting of diabetes and hypertension.  - Refilled Atorvastatin 40 mg daily

## 2017-10-11 NOTE — Patient Instructions (Signed)
It was a pleasure to see you again Seth [name].  Please continue your Amlodipine 10 mg once a day for blood pressure.  I have refilled your pain medicine to take only if needed every 12 hours.  Your Hemoglobin A1c today was 7.9%. We will like to keep this under 8%. Please continue to work on healthy eating patterns and exercise as tolerated.  Continue your Amaryl (Glimepiride) 2 mg once a day and Novolog sliding scale insulin before meals.  I have refilled the cholesterol medicine Atorvastatin (lipitor) 40 mg once daily.  Please follow up with [place] again in 3 months or sooner if needed.

## 2017-10-13 NOTE — Progress Notes (Signed)
Internal Medicine Clinic Attending  Case discussed with Dr. Patel at the time of the visit.  We reviewed the resident's history and exam and pertinent patient test results.  I agree with the assessment, diagnosis, and plan of care documented in the resident's note.  

## 2017-11-03 MED FILL — NOVOLOG FLEXPEN SYRINGE: 100 | 25 days supply | Qty: 15 | Fill #5

## 2017-11-06 ENCOUNTER — Emergency Department (HOSPITAL_COMMUNITY): Payer: Medicare Other

## 2017-11-06 ENCOUNTER — Inpatient Hospital Stay (HOSPITAL_COMMUNITY)
Admission: EM | Admit: 2017-11-06 | Discharge: 2017-11-14 | DRG: 131 | Disposition: A | Discharge status: Home / Self Care | Payer: Medicare Other | Attending: General Surgery | Admitting: General Surgery

## 2017-11-06 ENCOUNTER — Other Ambulatory Visit: Payer: Self-pay

## 2017-11-06 ENCOUNTER — Encounter (HOSPITAL_COMMUNITY): Payer: Self-pay | Admitting: Emergency Medicine

## 2017-11-06 ENCOUNTER — Other Ambulatory Visit: Payer: Self-pay | Admitting: Internal Medicine

## 2017-11-06 DIAGNOSIS — S1189XA Other open wound of other specified part of neck, initial encounter: Secondary | ICD-10-CM | POA: Diagnosis not present

## 2017-11-06 DIAGNOSIS — Z23 Encounter for immunization: Secondary | ICD-10-CM | POA: Diagnosis not present

## 2017-11-06 DIAGNOSIS — S199XXA Unspecified injury of neck, initial encounter: Secondary | ICD-10-CM | POA: Diagnosis not present

## 2017-11-06 DIAGNOSIS — S20302A Unspecified superficial injuries of left front wall of thorax, initial encounter: Secondary | ICD-10-CM | POA: Diagnosis present

## 2017-11-06 DIAGNOSIS — Z981 Arthrodesis status: Secondary | ICD-10-CM

## 2017-11-06 DIAGNOSIS — S098XXA Other specified injuries of head, initial encounter: Secondary | ICD-10-CM | POA: Diagnosis not present

## 2017-11-06 DIAGNOSIS — Z992 Dependence on renal dialysis: Secondary | ICD-10-CM

## 2017-11-06 DIAGNOSIS — S31104A Unspecified open wound of abdominal wall, left lower quadrant without penetration into peritoneal cavity, initial encounter: Secondary | ICD-10-CM | POA: Diagnosis not present

## 2017-11-06 DIAGNOSIS — E1122 Type 2 diabetes mellitus with diabetic chronic kidney disease: Secondary | ICD-10-CM

## 2017-11-06 DIAGNOSIS — S51032A Puncture wound without foreign body of left elbow, initial encounter: Secondary | ICD-10-CM | POA: Diagnosis present

## 2017-11-06 DIAGNOSIS — I10 Essential (primary) hypertension: Secondary | ICD-10-CM

## 2017-11-06 DIAGNOSIS — W3400XA Accidental discharge from unspecified firearms or gun, initial encounter: Secondary | ICD-10-CM

## 2017-11-06 DIAGNOSIS — T1490XA Injury, unspecified, initial encounter: Secondary | ICD-10-CM

## 2017-11-06 DIAGNOSIS — Z452 Encounter for adjustment and management of vascular access device: Secondary | ICD-10-CM

## 2017-11-06 DIAGNOSIS — N186 End stage renal disease: Secondary | ICD-10-CM | POA: Diagnosis present

## 2017-11-06 DIAGNOSIS — S02609A Fracture of mandible, unspecified, initial encounter for closed fracture: Secondary | ICD-10-CM | POA: Diagnosis not present

## 2017-11-06 DIAGNOSIS — S21102A Unspecified open wound of left front wall of thorax without penetration into thoracic cavity, initial encounter: Secondary | ICD-10-CM | POA: Diagnosis not present

## 2017-11-06 DIAGNOSIS — Z6841 Body Mass Index (BMI) 40.0 and over, adult: Secondary | ICD-10-CM | POA: Diagnosis not present

## 2017-11-06 DIAGNOSIS — S21109A Unspecified open wound of unspecified front wall of thorax without penetration into thoracic cavity, initial encounter: Secondary | ICD-10-CM | POA: Diagnosis not present

## 2017-11-06 DIAGNOSIS — S02609B Fracture of mandible, unspecified, initial encounter for open fracture: Secondary | ICD-10-CM

## 2017-11-06 DIAGNOSIS — S0993XA Unspecified injury of face, initial encounter: Secondary | ICD-10-CM | POA: Diagnosis not present

## 2017-11-06 DIAGNOSIS — S02600B Fracture of unspecified part of body of mandible, initial encounter for open fracture: Secondary | ICD-10-CM

## 2017-11-06 DIAGNOSIS — S31609A Unspecified open wound of abdominal wall, unspecified quadrant with penetration into peritoneal cavity, initial encounter: Secondary | ICD-10-CM | POA: Diagnosis not present

## 2017-11-06 DIAGNOSIS — S02602A Fracture of unspecified part of body of left mandible, initial encounter for closed fracture: Secondary | ICD-10-CM | POA: Diagnosis not present

## 2017-11-06 DIAGNOSIS — S0266XB Fracture of symphysis of mandible, initial encounter for open fracture: Secondary | ICD-10-CM | POA: Diagnosis present

## 2017-11-06 DIAGNOSIS — S31144A Puncture wound of abdominal wall with foreign body, left lower quadrant without penetration into peritoneal cavity, initial encounter: Secondary | ICD-10-CM | POA: Diagnosis not present

## 2017-11-06 DIAGNOSIS — F17200 Nicotine dependence, unspecified, uncomplicated: Secondary | ICD-10-CM | POA: Diagnosis present

## 2017-11-06 DIAGNOSIS — S41102A Unspecified open wound of left upper arm, initial encounter: Secondary | ICD-10-CM | POA: Diagnosis not present

## 2017-11-06 DIAGNOSIS — E119 Type 2 diabetes mellitus without complications: Secondary | ICD-10-CM | POA: Diagnosis not present

## 2017-11-06 DIAGNOSIS — S02600A Fracture of unspecified part of body of mandible, initial encounter for closed fracture: Secondary | ICD-10-CM | POA: Diagnosis not present

## 2017-11-06 DIAGNOSIS — S0180XA Unspecified open wound of other part of head, initial encounter: Secondary | ICD-10-CM | POA: Diagnosis not present

## 2017-11-06 DIAGNOSIS — S0990XA Unspecified injury of head, initial encounter: Secondary | ICD-10-CM | POA: Diagnosis not present

## 2017-11-06 DIAGNOSIS — S0266XA Fracture of symphysis of mandible, initial encounter for closed fracture: Secondary | ICD-10-CM | POA: Diagnosis not present

## 2017-11-06 DIAGNOSIS — S299XXA Unspecified injury of thorax, initial encounter: Secondary | ICD-10-CM | POA: Diagnosis not present

## 2017-11-06 LAB — COMPREHENSIVE METABOLIC PANEL
ALK PHOS: 59 U/L (ref 38–126)
ALT: 14 U/L — ABNORMAL LOW (ref 17–63)
AST: 20 U/L (ref 15–41)
Albumin: 3 g/dL — ABNORMAL LOW (ref 3.5–5.0)
Anion gap: 19 — ABNORMAL HIGH (ref 5–15)
BUN: 82 mg/dL — ABNORMAL HIGH (ref 6–20)
CALCIUM: 8.1 mg/dL — AB (ref 8.9–10.3)
CO2: 23 mmol/L (ref 22–32)
Chloride: 92 mmol/L — ABNORMAL LOW (ref 101–111)
Creatinine, Ser: 13.89 mg/dL — ABNORMAL HIGH (ref 0.61–1.24)
GFR, EST AFRICAN AMERICAN: 4 mL/min — AB (ref >60)
GFR, EST NON AFRICAN AMERICAN: 3 mL/min — AB (ref >60)
Glucose, Bld: 279 mg/dL — ABNORMAL HIGH (ref 65–99)
Potassium: 4.5 mmol/L (ref 3.5–5.1)
SODIUM: 134 mmol/L — AB (ref 135–145)
Total Bilirubin: 0.5 mg/dL (ref 0.3–1.2)
Total Protein: 6.3 g/dL — ABNORMAL LOW (ref 6.5–8.1)

## 2017-11-06 LAB — PREPARE FRESH FROZEN PLASMA
UNIT DIVISION: 0
Unit division: 0

## 2017-11-06 LAB — BPAM FFP
BLOOD PRODUCT EXPIRATION DATE: 201902172359
Blood Product Expiration Date: 201902172359
ISSUE DATE / TIME: 201901280547
ISSUE DATE / TIME: 201901280547
UNIT TYPE AND RH: 6200
UNIT TYPE AND RH: 6200

## 2017-11-06 LAB — GLUCOSE, CAPILLARY
Glucose-Capillary: 203 mg/dL — ABNORMAL HIGH (ref 65–99)
Glucose-Capillary: 194 mg/dL — ABNORMAL HIGH (ref 65–99)

## 2017-11-06 LAB — I-STAT CHEM 8, ED
BUN: 76 mg/dL — ABNORMAL HIGH (ref 6–20)
CHLORIDE: 93 mmol/L — AB (ref 101–111)
CREATININE: 14.5 mg/dL — AB (ref 0.61–1.24)
Calcium, Ion: 0.96 mmol/L — ABNORMAL LOW (ref 1.15–1.40)
GLUCOSE: 272 mg/dL — AB (ref 65–99)
HCT: 31 % — ABNORMAL LOW (ref 39.0–52.0)
Hemoglobin: 10.5 g/dL — ABNORMAL LOW (ref 13.0–17.0)
Potassium: 4.4 mmol/L (ref 3.5–5.1)
Sodium: 134 mmol/L — ABNORMAL LOW (ref 135–145)
TCO2: 27 mmol/L (ref 22–32)

## 2017-11-06 LAB — CBC
HCT: 31.9 % — ABNORMAL LOW (ref 39.0–52.0)
HEMOGLOBIN: 10.4 g/dL — AB (ref 13.0–17.0)
MCH: 31 pg (ref 26.0–34.0)
MCHC: 32.6 g/dL (ref 30.0–36.0)
MCV: 95.2 fL (ref 78.0–100.0)
PLATELETS: 229 10*3/uL (ref 150–400)
RBC: 3.35 MIL/uL — AB (ref 4.22–5.81)
RDW: 13.1 % (ref 11.5–15.5)
WBC: 17.5 10*3/uL — AB (ref 4.0–10.5)

## 2017-11-06 LAB — ABO/RH: ABO/RH(D): B POS

## 2017-11-06 LAB — I-STAT CG4 LACTIC ACID, ED: LACTIC ACID, VENOUS: 2.7 mmol/L — AB (ref 0.5–1.9)

## 2017-11-06 LAB — PROTIME-INR
INR: 1.02
PROTHROMBIN TIME: 13.3 s (ref 11.4–15.2)

## 2017-11-06 LAB — ETHANOL

## 2017-11-06 LAB — MRSA PCR SCREENING: MRSA by PCR: NEGATIVE

## 2017-11-06 LAB — BLOOD PRODUCT ORDER (VERBAL) VERIFICATION

## 2017-11-06 MED ORDER — CLINDAMYCIN PHOSPHATE 300 MG/50ML IV SOLN
300.0000 mg | Freq: Four times a day (QID) | INTRAVENOUS | Status: DC
  Administered 2017-11-06 – 2017-11-10 (×14): 300 mg via INTRAVENOUS
  Filled 2017-11-06 (×19): qty 50

## 2017-11-06 MED ORDER — SODIUM CHLORIDE 0.9 % IV SOLN
INTRAVENOUS | Status: DC
  Administered 2017-11-06 – 2017-11-10 (×5): via INTRAVENOUS

## 2017-11-06 MED ORDER — INSULIN ASPART 100 UNIT/ML ~~LOC~~ SOLN
0.0000 [IU] | Freq: Every day | SUBCUTANEOUS | Status: DC
  Administered 2017-11-09: 3 [IU] via SUBCUTANEOUS
  Administered 2017-11-10 – 2017-11-12 (×2): 2 [IU] via SUBCUTANEOUS

## 2017-11-06 MED ORDER — INSULIN ASPART 100 UNIT/ML ~~LOC~~ SOLN
0.0000 [IU] | Freq: Three times a day (TID) | SUBCUTANEOUS | Status: DC
  Administered 2017-11-06: 3 [IU] via SUBCUTANEOUS
  Administered 2017-11-07: 2 [IU] via SUBCUTANEOUS
  Administered 2017-11-07: 3 [IU] via SUBCUTANEOUS
  Administered 2017-11-08: 2 [IU] via SUBCUTANEOUS
  Administered 2017-11-08: 3 [IU] via SUBCUTANEOUS
  Administered 2017-11-08: 1 [IU] via SUBCUTANEOUS
  Administered 2017-11-09: 2 [IU] via SUBCUTANEOUS
  Administered 2017-11-09: 1 [IU] via SUBCUTANEOUS
  Administered 2017-11-10 (×2): 3 [IU] via SUBCUTANEOUS
  Administered 2017-11-11 (×2): 2 [IU] via SUBCUTANEOUS
  Administered 2017-11-11 – 2017-11-12 (×4): 1 [IU] via SUBCUTANEOUS
  Administered 2017-11-13: 2 [IU] via SUBCUTANEOUS
  Administered 2017-11-13 – 2017-11-14 (×3): 1 [IU] via SUBCUTANEOUS

## 2017-11-06 MED ORDER — CLINDAMYCIN PHOSPHATE 600 MG/50ML IV SOLN
600.0000 mg | Freq: Once | INTRAVENOUS | Status: AC
  Administered 2017-11-06: 600 mg via INTRAVENOUS
  Filled 2017-11-06: qty 50

## 2017-11-06 MED ORDER — CHLORHEXIDINE GLUCONATE 0.12 % MT SOLN
5.0000 mL | Freq: Four times a day (QID) | OROMUCOSAL | Status: DC
  Administered 2017-11-06 – 2017-11-10 (×13): 5 mL via OROMUCOSAL
  Filled 2017-11-06 (×12): qty 15

## 2017-11-06 MED ORDER — INSULIN ASPART 100 UNIT/ML ~~LOC~~ SOLN: 0.0000 [IU] | Freq: Three times a day (TID) | SUBCUTANEOUS | Status: DC

## 2017-11-06 MED ORDER — IOPAMIDOL (ISOVUE-300) INJECTION 61%
INTRAVENOUS | Status: AC
  Administered 2017-11-06: 100 mL via INTRAVENOUS
  Filled 2017-11-06: qty 100

## 2017-11-06 MED ORDER — MORPHINE SULFATE (PF) 4 MG/ML IV SOLN
2.0000 mg | INTRAVENOUS | Status: DC | PRN
  Administered 2017-11-09 (×2): 2 mg via INTRAVENOUS
  Filled 2017-11-06 (×2): qty 1

## 2017-11-06 MED ORDER — TETANUS-DIPHTH-ACELL PERTUSSIS 5-2.5-18.5 LF-MCG/0.5 IM SUSP
INTRAMUSCULAR | Status: AC
  Filled 2017-11-06: qty 0.5

## 2017-11-06 MED ORDER — TRIPLE ANTIBIOTIC 3.5-400-5000 EX OINT: 1.0000 "application " | TOPICAL_OINTMENT | Freq: Once | CUTANEOUS | Status: DC | PRN

## 2017-11-06 MED ORDER — INSULIN ASPART 100 UNIT/ML ~~LOC~~ SOLN: 0.0000 [IU] | Freq: Every day | SUBCUTANEOUS | Status: DC

## 2017-11-06 MED ORDER — TETANUS-DIPHTH-ACELL PERTUSSIS 5-2.5-18.5 LF-MCG/0.5 IM SUSP
0.5000 mL | Freq: Once | INTRAMUSCULAR | Status: AC
  Administered 2017-11-06: 0.5 mL via INTRAMUSCULAR

## 2017-11-06 MED ORDER — SILVER NITRATE-POT NITRATE 75-25 % EX MISC
1.0000 | Freq: Once | CUTANEOUS | Status: DC | PRN
  Filled 2017-11-06: qty 1

## 2017-11-06 MED ORDER — ONDANSETRON HCL 4 MG/2ML IJ SOLN
4.0000 mg | Freq: Four times a day (QID) | INTRAMUSCULAR | Status: DC | PRN
  Administered 2017-11-10: 4 mg via INTRAVENOUS

## 2017-11-06 MED ORDER — LIDOCAINE HCL 2 % EX GEL
1.0000 "application " | Freq: Once | CUTANEOUS | Status: DC | PRN
  Filled 2017-11-06: qty 5

## 2017-11-06 MED ORDER — FENTANYL CITRATE (PF) 100 MCG/2ML IJ SOLN
INTRAMUSCULAR | Status: AC
  Filled 2017-11-06: qty 2

## 2017-11-06 MED ORDER — BACITRACIN ZINC 500 UNIT/GM EX OINT
1.0000 "application " | TOPICAL_OINTMENT | Freq: Two times a day (BID) | CUTANEOUS | Status: DC
  Administered 2017-11-06 – 2017-11-10 (×9): 1 via TOPICAL
  Filled 2017-11-06 (×2): qty 28.35

## 2017-11-06 MED ORDER — HYDRALAZINE HCL 20 MG/ML IJ SOLN: 10.0000 mg | INTRAMUSCULAR | Status: DC | PRN

## 2017-11-06 MED ORDER — LIDOCAINE HCL 4 % EX SOLN
0.0000 mL | Freq: Once | CUTANEOUS | Status: DC | PRN
  Filled 2017-11-06: qty 50

## 2017-11-06 MED ORDER — LIDOCAINE-EPINEPHRINE (PF) 1 %-1:200000 IJ SOLN
0.0000 mL | Freq: Once | INTRAMUSCULAR | Status: DC | PRN
  Filled 2017-11-06: qty 30

## 2017-11-06 MED ORDER — FENTANYL CITRATE (PF) 100 MCG/2ML IJ SOLN
100.0000 ug | Freq: Once | INTRAMUSCULAR | Status: AC
  Administered 2017-11-06: 25 ug via INTRAVENOUS

## 2017-11-06 MED ORDER — OXYMETAZOLINE HCL 0.05 % NA SOLN
1.0000 | Freq: Once | NASAL | Status: DC | PRN
  Filled 2017-11-06: qty 15

## 2017-11-06 MED ORDER — ONDANSETRON 4 MG PO TBDP: 4.0000 mg | ORAL_TABLET | Freq: Four times a day (QID) | ORAL | Status: DC | PRN

## 2017-11-06 MED ORDER — HYDROMORPHONE HCL 1 MG/ML IJ SOLN
1.0000 mg | INTRAMUSCULAR | Status: DC | PRN
  Administered 2017-11-06 – 2017-11-14 (×48): 1 mg via INTRAVENOUS
  Filled 2017-11-06 (×50): qty 1

## 2017-11-06 MED ORDER — TRIPLE ANTIBIOTIC 3.5-400-5000 EX OINT
1.0000 "application " | TOPICAL_OINTMENT | Freq: Once | CUTANEOUS | Status: DC | PRN
  Filled 2017-11-06: qty 1
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
## 2017-11-06 NOTE — ED Notes (Signed)
Pt in CT at this time.
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
## 2017-11-06 NOTE — Procedures (Signed)
Central Venous Catheter Insertion Procedure Note Seth [name] [id number] [birth year]  Procedure: Insertion of Central Venous Catheter Indications: Drug and/or fluid administration  Procedure Details Consent: Unable to obtain consent because of emergent medical necessity. Time Out: Verified patient identification, verified procedure, site/side was marked, verified correct patient position, special equipment/implants available, medications/allergies/relevent history reviewed, required imaging and test results available.  Performed  Maximum sterile technique was used including antiseptics, cap, gloves, gown, hand hygiene, mask and sheet. Skin prep: Chlorhexidine; local anesthetic administered A antimicrobial bonded/coated triple lumen catheter was placed in the left subclavian vein using the Seldinger technique.  Evaluation Blood flow good Complications: No apparent complications Patient did tolerate procedure well. Chest X-ray ordered to verify placement.  CXR: good placement.  Seth [name] 11/06/2017, 8:22 AM
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
## 2017-11-06 NOTE — ED Notes (Signed)
Attempted Report x1.   
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
## 2017-11-06 NOTE — Progress Notes (Signed)
Inpatient Diabetes Program Recommendations  AACE/ADA: New Consensus Statement on Inpatient Glycemic Control (2015)  Target Ranges:  Prepandial:   less than 140 mg/dL      Peak postprandial:   less than 180 mg/dL (1-2 hours)      Critically ill patients:  140 - 180 mg/dL   Results for TORRIAN, CANION (MRN [id number]) as of 11/06/2017 11:37  Ref. Range 11/06/2017 06:55 11/06/2017 07:20  Glucose Latest Ref Range: 65 - 99 mg/dL 279 (H) 272 (H)   Review of Glycemic Control  Diabetes history: DM2 Outpatient Diabetes medications: Amaryl 4 mg QAM, Novolog correction TID with meals Current orders for Inpatient glycemic control: None  Inpatient Diabetes Program Recommendations: Correction ([facility]): While inpatient, please consider ordering CBGs with Novolog correction scale 0-9 units TID with meals and Novolog 0-5 units QHS.  Thanks, Barnie Alderman, RN, MSN, CDE Diabetes Coordinator Inpatient Diabetes Program [phone] (Team Pager from 8am to 5pm)
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
## 2017-11-06 NOTE — ED Notes (Signed)
[place] at the bedside

## 2017-11-06 NOTE — ED Triage Notes (Signed)
All history will be imported when charts merged

## 2017-11-07 ENCOUNTER — Inpatient Hospital Stay (HOSPITAL_COMMUNITY): Payer: Medicare Other

## 2017-11-07 LAB — BASIC METABOLIC PANEL
ANION GAP: 16 — AB (ref 5–15)
BUN: 59 mg/dL — ABNORMAL HIGH (ref 6–20)
CHLORIDE: 92 mmol/L — AB (ref 101–111)
CO2: 24 mmol/L (ref 22–32)
CREATININE: 9.68 mg/dL — AB (ref 0.61–1.24)
Calcium: 8.1 mg/dL — ABNORMAL LOW (ref 8.9–10.3)
GFR calc non Af Amer: 5 mL/min — ABNORMAL LOW (ref >60)
GFR, EST AFRICAN AMERICAN: 6 mL/min — AB (ref >60)
Glucose, Bld: 108 mg/dL — ABNORMAL HIGH (ref 65–99)
POTASSIUM: 3.6 mmol/L (ref 3.5–5.1)
SODIUM: 132 mmol/L — AB (ref 135–145)

## 2017-11-07 LAB — BPAM RBC
BLOOD PRODUCT EXPIRATION DATE: 201902212359
BLOOD PRODUCT EXPIRATION DATE: 201902212359
ISSUE DATE / TIME: 201901280547
ISSUE DATE / TIME: 201901280547
UNIT TYPE AND RH: 9500
UNIT TYPE AND RH: 9500

## 2017-11-07 LAB — TYPE AND SCREEN
ABO/RH(D): B POS
ANTIBODY SCREEN: NEGATIVE
Unit division: 0
Unit division: 0

## 2017-11-07 LAB — GLUCOSE, CAPILLARY
GLUCOSE-CAPILLARY: 225 mg/dL — AB (ref 65–99)
GLUCOSE-CAPILLARY: 165 mg/dL — AB (ref 65–99)
GLUCOSE-CAPILLARY: 125 mg/dL — AB (ref 65–99)
GLUCOSE-CAPILLARY: 144 mg/dL — AB (ref 65–99)

## 2017-11-07 LAB — CBC
HEMATOCRIT: 26.3 % — AB (ref 39.0–52.0)
HEMOGLOBIN: 8.7 g/dL — AB (ref 13.0–17.0)
MCH: 30.3 pg (ref 26.0–34.0)
MCHC: 33.1 g/dL (ref 30.0–36.0)
MCV: 91.6 fL (ref 78.0–100.0)
Platelets: 196 10*3/uL (ref 150–400)
RBC: 2.87 MIL/uL — AB (ref 4.22–5.81)
RDW: 12.9 % (ref 11.5–15.5)
WBC: 10.6 10*3/uL — ABNORMAL HIGH (ref 4.0–10.5)

## 2017-11-07 LAB — HIV ANTIBODY (ROUTINE TESTING W REFLEX): HIV SCREEN 4TH GENERATION: NONREACTIVE

## 2017-11-07 MED ORDER — LIDOCAINE HCL (PF) 1 % IJ SOLN: 5.0000 mL | INTRAMUSCULAR | Status: DC | PRN

## 2017-11-07 MED ORDER — HEPARIN SODIUM (PORCINE) 1000 UNIT/ML DIALYSIS: 1000.0000 [IU] | INTRAMUSCULAR | Status: DC | PRN

## 2017-11-07 MED ORDER — SODIUM CHLORIDE 0.9 % IV SOLN
100.0000 mL | INTRAVENOUS | Status: DC | PRN
Start: 2017-11-07 — End: 2017-11-07

## 2017-11-07 MED ORDER — CHLORHEXIDINE GLUCONATE CLOTH 2 % EX PADS
6.0000 | MEDICATED_PAD | Freq: Every day | CUTANEOUS | Status: DC
  Administered 2017-11-07 – 2017-11-14 (×7): 6 via TOPICAL

## 2017-11-07 MED ORDER — METHOCARBAMOL 1000 MG/10ML IJ SOLN
500.0000 mg | Freq: Three times a day (TID) | INTRAVENOUS | Status: DC | PRN
  Filled 2017-11-07: qty 5

## 2017-11-07 MED ORDER — PENTAFLUOROPROP-TETRAFLUOROETH EX AERO: 1.0000 "application " | INHALATION_SPRAY | CUTANEOUS | Status: DC | PRN

## 2017-11-07 MED ORDER — ALTEPLASE 2 MG IJ SOLR: 2.0000 mg | Freq: Once | INTRAMUSCULAR | Status: DC | PRN

## 2017-11-07 MED ORDER — LIDOCAINE-PRILOCAINE 2.5-2.5 % EX CREA: 1.0000 "application " | TOPICAL_CREAM | CUTANEOUS | Status: DC | PRN

## 2017-11-07 MED ORDER — HEPARIN SODIUM (PORCINE) 1000 UNIT/ML DIALYSIS: 20.0000 [IU]/kg | INTRAMUSCULAR | Status: DC | PRN
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
## 2017-11-07 NOTE — Progress Notes (Signed)
HD tx completed @ 2149 w/o problem, UF goal met, blood rinsed back, VSS, pt is pain and is requesting primary nurse come and get him to go back to room rather than wait on transport who has been taking extended amount of time to pick up pts, report called to primary nurse
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
## 2017-11-07 NOTE — Progress Notes (Signed)
Received pt from [facility], RN, HD tx initiated by her @ 1719 via 15G x2 w/o problem, VSS, will cont to monitor while on HD tx
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
## 2017-11-08 LAB — GLUCOSE, CAPILLARY
GLUCOSE-CAPILLARY: 165 mg/dL — AB (ref 65–99)
GLUCOSE-CAPILLARY: 212 mg/dL — AB (ref 65–99)
GLUCOSE-CAPILLARY: 153 mg/dL — AB (ref 65–99)
GLUCOSE-CAPILLARY: 140 mg/dL — AB (ref 65–99)
Glucose-Capillary: 180 mg/dL — ABNORMAL HIGH (ref 65–99)

## 2017-11-08 LAB — HEPATITIS B SURFACE ANTIGEN: Hepatitis B Surface Ag: NEGATIVE

## 2017-11-08 LAB — CDS SEROLOGY

## 2017-11-08 MED ORDER — AMLODIPINE BESYLATE 10 MG PO TABS
10.0000 mg | ORAL_TABLET | Freq: Every day | ORAL | Status: DC
  Administered 2017-11-08 – 2017-11-14 (×6): 10 mg via ORAL
  Filled 2017-11-08 (×6): qty 1

## 2017-11-08 MED ORDER — FERRIC CITRATE 1 GM 210 MG(FE) PO TABS
630.0000 mg | ORAL_TABLET | Freq: Three times a day (TID) | ORAL | Status: DC
  Administered 2017-11-08 – 2017-11-14 (×12): 630 mg via ORAL
  Filled 2017-11-08 (×21): qty 3
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
## 2017-11-08 NOTE — H&P (View-Only) (Signed)
11/08/2017 10:00 AM  Florina Ou [id number]  Hosp  Day 3    Temp:  [97.4 F (36.3 C)-98.5 F (36.9 C)] 98.3 F (36.8 C) (01/30 0751) Pulse Rate:  [80-107] 92 (01/30 0800) Resp:  [13-26] 20 (01/30 0800) BP: (102-175)/(33-109) 159/109 (01/30 0800) SpO2:  [91 %-100 %] 92 % (01/30 0800) Weight:  [144.3 kg (318 lb 2 oz)-151.1 kg (333 lb 1.8 oz)] 144.3 kg (318 lb 2 oz) (01/29 2216),     Intake/Output Summary (Last 24 hours) at 11/08/2017 1000 Last data filed at 11/08/2017 0400 Gross per 24 hour  Intake 1450 ml  Output 6804 ml  Net -5354 ml    Results for orders placed or performed during the hospital encounter of 11/06/17 (from the past 24 hour(s))  Glucose, capillary     Status: Abnormal   Collection Time: 11/07/17 12:06 PM  Result Value Ref Range   Glucose-Capillary 165 (H) 65 - 99 mg/dL  Glucose, capillary     Status: Abnormal   Collection Time: 11/07/17  4:12 PM  Result Value Ref Range   Glucose-Capillary 125 (H) 65 - 99 mg/dL  CBC     Status: Abnormal   Collection Time: 11/07/17  6:33 PM  Result Value Ref Range   WBC 10.6 (H) 4.0 - 10.5 K/uL   RBC 2.87 (L) 4.22 - 5.81 MIL/uL   Hemoglobin 8.7 (L) 13.0 - 17.0 g/dL   HCT 26.3 (L) 39.0 - 52.0 %   MCV 91.6 78.0 - 100.0 fL   MCH 30.3 26.0 - 34.0 pg   MCHC 33.1 30.0 - 36.0 g/dL   RDW 12.9 11.5 - 15.5 %   Platelets 196 150 - 400 K/uL  Basic metabolic panel     Status: Abnormal   Collection Time: 11/07/17  6:33 PM  Result Value Ref Range   Sodium 132 (L) 135 - 145 mmol/L   Potassium 3.6 3.5 - 5.1 mmol/L   Chloride 92 (L) 101 - 111 mmol/L   CO2 24 22 - 32 mmol/L   Glucose, Bld 108 (H) 65 - 99 mg/dL   BUN 59 (H) 6 - 20 mg/dL   Creatinine, Ser 9.68 (H) 0.61 - 1.24 mg/dL   Calcium 8.1 (L) 8.9 - 10.3 mg/dL   GFR calc non Af Amer 5 (L) >60 mL/min   GFR calc Af Amer 6 (L) >60 mL/min   Anion gap 16 (H) 5 - 15  Hepatitis B surface antigen     Status: None   Collection Time: 11/07/17  7:53 PM  Result Value Ref Range    Hepatitis B Surface Ag Negative Negative  Glucose, capillary     Status: Abnormal   Collection Time: 11/07/17 10:48 PM  Result Value Ref Range   Glucose-Capillary 144 (H) 65 - 99 mg/dL  Glucose, capillary     Status: Abnormal   Collection Time: 11/08/17  3:30 AM  Result Value Ref Range   Glucose-Capillary 165 (H) 65 - 99 mg/dL  Glucose, capillary     Status: Abnormal   Collection Time: 11/08/17  7:55 AM  Result Value Ref Range   Glucose-Capillary 212 (H) 65 - 99 mg/dL    SUBJECTIVE:  Mod pain.  Breathing OK.  Taking po fluids  OBJECTIVE:  Still mod FOM swelling and tongue elevation.    IMPRESSION:  Comminuted mandib fx  PLAN:  For ORIF Friday, 1 FEB.  Discussed with pt.  Questions answered and informed consent obtained.  Orders written. Could be ready for discharge Saturday.  Jodi Marble
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
## 2017-11-08 NOTE — Progress Notes (Signed)
11/08/2017 10:00 AM  Florina Ou [id number]  Hosp  Day 3    Temp:  [97.4 F (36.3 C)-98.5 F (36.9 C)] 98.3 F (36.8 C) (01/30 0751) Pulse Rate:  [80-107] 92 (01/30 0800) Resp:  [13-26] 20 (01/30 0800) BP: (102-175)/(33-109) 159/109 (01/30 0800) SpO2:  [91 %-100 %] 92 % (01/30 0800) Weight:  [144.3 kg (318 lb 2 oz)-151.1 kg (333 lb 1.8 oz)] 144.3 kg (318 lb 2 oz) (01/29 2216),     Intake/Output Summary (Last 24 hours) at 11/08/2017 1000 Last data filed at 11/08/2017 0400 Gross per 24 hour  Intake 1450 ml  Output 6804 ml  Net -5354 ml    Results for orders placed or performed during the hospital encounter of 11/06/17 (from the past 24 hour(s))  Glucose, capillary     Status: Abnormal   Collection Time: 11/07/17 12:06 PM  Result Value Ref Range   Glucose-Capillary 165 (H) 65 - 99 mg/dL  Glucose, capillary     Status: Abnormal   Collection Time: 11/07/17  4:12 PM  Result Value Ref Range   Glucose-Capillary 125 (H) 65 - 99 mg/dL  CBC     Status: Abnormal   Collection Time: 11/07/17  6:33 PM  Result Value Ref Range   WBC 10.6 (H) 4.0 - 10.5 K/uL   RBC 2.87 (L) 4.22 - 5.81 MIL/uL   Hemoglobin 8.7 (L) 13.0 - 17.0 g/dL   HCT 26.3 (L) 39.0 - 52.0 %   MCV 91.6 78.0 - 100.0 fL   MCH 30.3 26.0 - 34.0 pg   MCHC 33.1 30.0 - 36.0 g/dL   RDW 12.9 11.5 - 15.5 %   Platelets 196 150 - 400 K/uL  Basic metabolic panel     Status: Abnormal   Collection Time: 11/07/17  6:33 PM  Result Value Ref Range   Sodium 132 (L) 135 - 145 mmol/L   Potassium 3.6 3.5 - 5.1 mmol/L   Chloride 92 (L) 101 - 111 mmol/L   CO2 24 22 - 32 mmol/L   Glucose, Bld 108 (H) 65 - 99 mg/dL   BUN 59 (H) 6 - 20 mg/dL   Creatinine, Ser 9.68 (H) 0.61 - 1.24 mg/dL   Calcium 8.1 (L) 8.9 - 10.3 mg/dL   GFR calc non Af Amer 5 (L) >60 mL/min   GFR calc Af Amer 6 (L) >60 mL/min   Anion gap 16 (H) 5 - 15  Hepatitis B surface antigen     Status: None   Collection Time: 11/07/17  7:53 PM  Result Value Ref Range    Hepatitis B Surface Ag Negative Negative  Glucose, capillary     Status: Abnormal   Collection Time: 11/07/17 10:48 PM  Result Value Ref Range   Glucose-Capillary 144 (H) 65 - 99 mg/dL  Glucose, capillary     Status: Abnormal   Collection Time: 11/08/17  3:30 AM  Result Value Ref Range   Glucose-Capillary 165 (H) 65 - 99 mg/dL  Glucose, capillary     Status: Abnormal   Collection Time: 11/08/17  7:55 AM  Result Value Ref Range   Glucose-Capillary 212 (H) 65 - 99 mg/dL    SUBJECTIVE:  Mod pain.  Breathing OK.  Taking po fluids  OBJECTIVE:  Still mod FOM swelling and tongue elevation.    IMPRESSION:  Comminuted mandib fx  PLAN:  For ORIF Friday, 1 FEB.  Discussed with pt.  Questions answered and informed consent obtained.  Orders written. Could be ready for discharge Saturday.  Jodi Marble
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
## 2017-11-09 LAB — RENAL FUNCTION PANEL
ALBUMIN: 3.1 g/dL — AB (ref 3.5–5.0)
ANION GAP: 17 — AB (ref 5–15)
BUN: 62 mg/dL — AB (ref 6–20)
CALCIUM: 7.6 mg/dL — AB (ref 8.9–10.3)
CO2: 20 mmol/L — AB (ref 22–32)
Chloride: 94 mmol/L — ABNORMAL LOW (ref 101–111)
Creatinine, Ser: 12.32 mg/dL — ABNORMAL HIGH (ref 0.61–1.24)
GFR calc Af Amer: 5 mL/min — ABNORMAL LOW (ref >60)
GFR calc non Af Amer: 4 mL/min — ABNORMAL LOW (ref >60)
GLUCOSE: 222 mg/dL — AB (ref 65–99)
PHOSPHORUS: 7.2 mg/dL — AB (ref 2.5–4.6)
Potassium: 4.5 mmol/L (ref 3.5–5.1)
SODIUM: 131 mmol/L — AB (ref 135–145)

## 2017-11-09 LAB — CBC
HCT: 21.8 % — ABNORMAL LOW (ref 39.0–52.0)
Hemoglobin: 7.2 g/dL — ABNORMAL LOW (ref 13.0–17.0)
MCH: 31.3 pg (ref 26.0–34.0)
MCHC: 33 g/dL (ref 30.0–36.0)
MCV: 94.8 fL (ref 78.0–100.0)
PLATELETS: 194 10*3/uL (ref 150–400)
RBC: 2.3 MIL/uL — ABNORMAL LOW (ref 4.22–5.81)
RDW: 13.6 % (ref 11.5–15.5)
WBC: 9.2 10*3/uL (ref 4.0–10.5)

## 2017-11-09 LAB — GLUCOSE, CAPILLARY
GLUCOSE-CAPILLARY: 176 mg/dL — AB (ref 65–99)
GLUCOSE-CAPILLARY: 150 mg/dL — AB (ref 65–99)
Glucose-Capillary: 169 mg/dL — ABNORMAL HIGH (ref 65–99)
Glucose-Capillary: 272 mg/dL — ABNORMAL HIGH (ref 65–99)

## 2017-11-09 LAB — HEPATITIS B SURFACE ANTIBODY,QUALITATIVE: Hep B S Ab: REACTIVE

## 2017-11-09 MED ORDER — LIDOCAINE-PRILOCAINE 2.5-2.5 % EX CREA
1.0000 "application " | TOPICAL_CREAM | CUTANEOUS | Status: DC | PRN
  Filled 2017-11-09: qty 5

## 2017-11-09 MED ORDER — CHLORHEXIDINE GLUCONATE CLOTH 2 % EX PADS
6.0000 | MEDICATED_PAD | Freq: Once | CUTANEOUS | Status: AC
  Administered 2017-11-09: 6 via TOPICAL

## 2017-11-09 MED ORDER — OXYMETAZOLINE HCL 0.05 % NA SOLN
2.0000 | NASAL | Status: DC | PRN
  Administered 2017-11-10: 2 via NASAL
  Filled 2017-11-09 (×2): qty 15

## 2017-11-09 MED ORDER — ALTEPLASE 2 MG IJ SOLR: 2.0000 mg | Freq: Once | INTRAMUSCULAR | Status: DC | PRN

## 2017-11-09 MED ORDER — CLINDAMYCIN PHOSPHATE 900 MG/50ML IV SOLN
900.0000 mg | INTRAVENOUS | Status: AC
  Administered 2017-11-10: 900 mg via INTRAVENOUS

## 2017-11-09 MED ORDER — LIDOCAINE HCL (PF) 1 % IJ SOLN: 5.0000 mL | INTRAMUSCULAR | Status: DC | PRN

## 2017-11-09 MED ORDER — HEPARIN SODIUM (PORCINE) 1000 UNIT/ML DIALYSIS: 20.0000 [IU]/kg | INTRAMUSCULAR | Status: DC | PRN

## 2017-11-09 MED ORDER — PENTAFLUOROPROP-TETRAFLUOROETH EX AERO: 1.0000 "application " | INHALATION_SPRAY | CUTANEOUS | Status: DC | PRN

## 2017-11-09 MED ORDER — HEPARIN SODIUM (PORCINE) 1000 UNIT/ML DIALYSIS
1000.0000 [IU] | INTRAMUSCULAR | Status: DC | PRN
  Filled 2017-11-09: qty 1

## 2017-11-09 MED ORDER — SODIUM CHLORIDE 0.9 % IV SOLN: 100.0000 mL | INTRAVENOUS | Status: DC | PRN
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
## 2017-11-10 ENCOUNTER — Encounter (HOSPITAL_COMMUNITY): Payer: Self-pay | Admitting: Certified Registered Nurse Anesthetist

## 2017-11-10 ENCOUNTER — Inpatient Hospital Stay (HOSPITAL_COMMUNITY): Payer: Medicare Other | Admitting: Certified Registered Nurse Anesthetist

## 2017-11-10 ENCOUNTER — Encounter (HOSPITAL_COMMUNITY): Admission: EM | Disposition: A | Discharge status: Home / Self Care | Payer: Self-pay | Source: Home / Self Care

## 2017-11-10 HISTORY — PX: ORIF MANDIBULAR FRACTURE: SHX2127

## 2017-11-10 LAB — RENAL FUNCTION PANEL
Albumin: 3.3 g/dL — ABNORMAL LOW (ref 3.5–5.0)
Anion gap: 17 — ABNORMAL HIGH (ref 5–15)
BUN: 42 mg/dL — AB (ref 6–20)
CALCIUM: 8.2 mg/dL — AB (ref 8.9–10.3)
CHLORIDE: 95 mmol/L — AB (ref 101–111)
CO2: 22 mmol/L (ref 22–32)
CREATININE: 10.34 mg/dL — AB (ref 0.61–1.24)
GFR calc non Af Amer: 5 mL/min — ABNORMAL LOW (ref >60)
GFR, EST AFRICAN AMERICAN: 6 mL/min — AB (ref >60)
Glucose, Bld: 209 mg/dL — ABNORMAL HIGH (ref 65–99)
Phosphorus: 6.7 mg/dL — ABNORMAL HIGH (ref 2.5–4.6)
Potassium: 4.6 mmol/L (ref 3.5–5.1)
SODIUM: 134 mmol/L — AB (ref 135–145)

## 2017-11-10 LAB — CBC
HCT: 24.4 % — ABNORMAL LOW (ref 39.0–52.0)
Hemoglobin: 8 g/dL — ABNORMAL LOW (ref 13.0–17.0)
MCH: 31.6 pg (ref 26.0–34.0)
MCHC: 32.8 g/dL (ref 30.0–36.0)
MCV: 96.4 fL (ref 78.0–100.0)
PLATELETS: 254 10*3/uL (ref 150–400)
RBC: 2.53 MIL/uL — ABNORMAL LOW (ref 4.22–5.81)
RDW: 13.3 % (ref 11.5–15.5)
WBC: 12.9 10*3/uL — AB (ref 4.0–10.5)

## 2017-11-10 LAB — GLUCOSE, CAPILLARY
GLUCOSE-CAPILLARY: 169 mg/dL — AB (ref 65–99)
GLUCOSE-CAPILLARY: 220 mg/dL — AB (ref 65–99)
GLUCOSE-CAPILLARY: 241 mg/dL — AB (ref 65–99)
Glucose-Capillary: 220 mg/dL — ABNORMAL HIGH (ref 65–99)

## 2017-11-10 SURGERY — OPEN REDUCTION INTERNAL FIXATION (ORIF) MANDIBULAR FRACTURE
Anesthesia: General

## 2017-11-10 MED ORDER — PROPOFOL 10 MG/ML IV BOLUS
INTRAVENOUS | Status: AC
  Filled 2017-11-10: qty 20

## 2017-11-10 MED ORDER — NEOSTIGMINE METHYLSULFATE 10 MG/10ML IV SOLN
INTRAVENOUS | Status: DC | PRN
  Administered 2017-11-10: 5 mg via INTRAVENOUS

## 2017-11-10 MED ORDER — CHLORHEXIDINE GLUCONATE 0.12 % MT SOLN
OROMUCOSAL | Status: AC
  Filled 2017-11-10: qty 15

## 2017-11-10 MED ORDER — MIDAZOLAM HCL 5 MG/5ML IJ SOLN
INTRAMUSCULAR | Status: DC | PRN
  Administered 2017-11-10: 2 mg via INTRAVENOUS

## 2017-11-10 MED ORDER — HYDROMORPHONE HCL 1 MG/ML IJ SOLN
INTRAMUSCULAR | Status: AC
  Administered 2017-11-10: 15:00:00
  Filled 2017-11-10: qty 1

## 2017-11-10 MED ORDER — MIDAZOLAM HCL 2 MG/2ML IJ SOLN
INTRAMUSCULAR | Status: AC
  Filled 2017-11-10: qty 2

## 2017-11-10 MED ORDER — ROCURONIUM BROMIDE 100 MG/10ML IV SOLN
INTRAVENOUS | Status: DC | PRN
  Administered 2017-11-10: 70 mg via INTRAVENOUS

## 2017-11-10 MED ORDER — HYDROCODONE-ACETAMINOPHEN 7.5-325 MG/15ML PO SOLN
10.0000 mL | ORAL | Status: DC | PRN
  Administered 2017-11-10 – 2017-11-12 (×6): 15 mL via ORAL
  Administered 2017-11-12: 20 mL via ORAL
  Administered 2017-11-13: 15 mL via ORAL
  Administered 2017-11-14: 20 mL via ORAL
  Filled 2017-11-10 (×5): qty 15
  Filled 2017-11-10 (×4): qty 30

## 2017-11-10 MED ORDER — PHENYLEPHRINE 40 MCG/ML (10ML) SYRINGE FOR IV PUSH (FOR BLOOD PRESSURE SUPPORT)
PREFILLED_SYRINGE | INTRAVENOUS | Status: AC
  Filled 2017-11-10: qty 10

## 2017-11-10 MED ORDER — HEPARIN SODIUM (PORCINE) 5000 UNIT/ML IJ SOLN
5000.0000 [IU] | Freq: Three times a day (TID) | INTRAMUSCULAR | Status: DC
  Administered 2017-11-10 – 2017-11-14 (×11): 5000 [IU] via SUBCUTANEOUS
  Filled 2017-11-10 (×11): qty 1

## 2017-11-10 MED ORDER — FENTANYL CITRATE (PF) 100 MCG/2ML IJ SOLN
INTRAMUSCULAR | Status: DC | PRN
  Administered 2017-11-10: 50 ug via INTRAVENOUS
  Administered 2017-11-10: 100 ug via INTRAVENOUS

## 2017-11-10 MED ORDER — GLYCOPYRROLATE 0.2 MG/ML IJ SOLN
INTRAMUSCULAR | Status: DC | PRN
  Administered 2017-11-10: .8 mg via INTRAVENOUS

## 2017-11-10 MED ORDER — HYDROMORPHONE HCL 1 MG/ML IJ SOLN
0.2500 mg | INTRAMUSCULAR | Status: DC | PRN
  Administered 2017-11-10 (×4): 0.5 mg via INTRAVENOUS

## 2017-11-10 MED ORDER — SODIUM CHLORIDE 0.9 % IV SOLN
INTRAVENOUS | Status: DC
  Administered 2017-11-10: 10:00:00 via INTRAVENOUS

## 2017-11-10 MED ORDER — GELATIN ADSORBABLE OP FILM
ORAL_FILM | OPHTHALMIC | Status: AC
  Filled 2017-11-10: qty 1

## 2017-11-10 MED ORDER — IBUPROFEN 100 MG/5ML PO SUSP
400.0000 mg | Freq: Four times a day (QID) | ORAL | Status: DC | PRN
  Administered 2017-11-11: 400 mg via ORAL
  Filled 2017-11-10 (×2): qty 20

## 2017-11-10 MED ORDER — FENTANYL CITRATE (PF) 250 MCG/5ML IJ SOLN
INTRAMUSCULAR | Status: AC
  Filled 2017-11-10: qty 5

## 2017-11-10 MED ORDER — DEXAMETHASONE SODIUM PHOSPHATE 10 MG/ML IJ SOLN
INTRAMUSCULAR | Status: DC | PRN
  Administered 2017-11-10: 4 mg via INTRAVENOUS

## 2017-11-10 MED ORDER — LIDOCAINE-EPINEPHRINE 1 %-1:100000 IJ SOLN
INTRAMUSCULAR | Status: AC
  Filled 2017-11-10: qty 1

## 2017-11-10 MED ORDER — ONDANSETRON HCL 4 MG/2ML IJ SOLN: 4.0000 mg | INTRAMUSCULAR | Status: DC | PRN

## 2017-11-10 MED ORDER — BACIT-POLY-NEO HC 1 % EX OINT
TOPICAL_OINTMENT | CUTANEOUS | Status: AC
  Filled 2017-11-10: qty 15

## 2017-11-10 MED ORDER — PROPOFOL 10 MG/ML IV BOLUS
INTRAVENOUS | Status: DC | PRN
  Administered 2017-11-10: 200 mg via INTRAVENOUS

## 2017-11-10 MED ORDER — BACITRACIN ZINC 500 UNIT/GM EX OINT
TOPICAL_OINTMENT | CUTANEOUS | Status: AC
  Filled 2017-11-10: qty 28.35

## 2017-11-10 MED ORDER — PHENYLEPHRINE HCL 10 MG/ML IJ SOLN
INTRAVENOUS | Status: DC | PRN
  Administered 2017-11-10: 15 ug/min via INTRAVENOUS

## 2017-11-10 MED ORDER — CLINDAMYCIN PHOSPHATE 600 MG/50ML IV SOLN
600.0000 mg | Freq: Three times a day (TID) | INTRAVENOUS | Status: DC
  Administered 2017-11-10 – 2017-11-14 (×11): 600 mg via INTRAVENOUS
  Filled 2017-11-10 (×12): qty 50

## 2017-11-10 MED ORDER — DEXMEDETOMIDINE HCL IN NACL 200 MCG/50ML IV SOLN
INTRAVENOUS | Status: DC | PRN
  Administered 2017-11-10 (×5): 8 ug via INTRAVENOUS

## 2017-11-10 MED ORDER — ONDANSETRON HCL 4 MG/2ML IJ SOLN
INTRAMUSCULAR | Status: AC
  Filled 2017-11-10: qty 2

## 2017-11-10 MED ORDER — DEXTROSE-NACL 5-0.45 % IV SOLN
INTRAVENOUS | Status: DC
  Administered 2017-11-10: 17:00:00 via INTRAVENOUS

## 2017-11-10 MED ORDER — SUGAMMADEX SODIUM 200 MG/2ML IV SOLN: INTRAVENOUS | Status: DC | PRN

## 2017-11-10 MED ORDER — SUCCINYLCHOLINE CHLORIDE 20 MG/ML IJ SOLN
INTRAMUSCULAR | Status: DC | PRN
  Administered 2017-11-10: 160 mg via INTRAVENOUS

## 2017-11-10 MED ORDER — HYDROMORPHONE HCL 1 MG/ML IJ SOLN
INTRAMUSCULAR | Status: AC
  Administered 2017-11-10: 16:00:00
  Filled 2017-11-10: qty 1

## 2017-11-10 MED ORDER — BACITRACIN ZINC 500 UNIT/GM EX OINT
1.0000 "application " | TOPICAL_OINTMENT | Freq: Three times a day (TID) | CUTANEOUS | Status: DC
  Administered 2017-11-10 – 2017-11-14 (×11): 1 via TOPICAL
  Filled 2017-11-10 (×3): qty 28.35

## 2017-11-10 MED ORDER — LIDOCAINE HCL (CARDIAC) 20 MG/ML IV SOLN
INTRAVENOUS | Status: DC | PRN
  Administered 2017-11-10: 60 mg via INTRAVENOUS

## 2017-11-10 MED ORDER — CLINDAMYCIN PHOSPHATE 900 MG/50ML IV SOLN
INTRAVENOUS | Status: AC
  Filled 2017-11-10: qty 50

## 2017-11-10 MED ORDER — OXYMETAZOLINE HCL 0.05 % NA SOLN
NASAL | Status: DC | PRN
  Administered 2017-11-10: 1

## 2017-11-10 MED ORDER — ONDANSETRON HCL 4 MG PO TABS: 4.0000 mg | ORAL_TABLET | ORAL | Status: DC | PRN

## 2017-11-10 MED ORDER — CHLORHEXIDINE GLUCONATE 0.12 % MT SOLN
5.0000 mL | Freq: Four times a day (QID) | OROMUCOSAL | Status: DC
  Administered 2017-11-10 – 2017-11-14 (×13): 5 mL via OROMUCOSAL
  Filled 2017-11-10 (×12): qty 15

## 2017-11-10 MED ORDER — LIDOCAINE-EPINEPHRINE 1 %-1:100000 IJ SOLN
INTRAMUSCULAR | Status: DC | PRN
  Administered 2017-11-10: 20 mL

## 2017-11-10 SURGICAL SUPPLY — 49 items
ATTRACTOMAT 16X20 MAGNETIC DRP (DRAPES) ×3 IMPLANT
BIT DRILL 11X150 CANN QR (BIT) ×3 IMPLANT
BLADE SURG 15 STRL LF DISP TIS (BLADE) IMPLANT
BLADE SURG 15 STRL SS (BLADE)
CANISTER SUCT 3000ML PPV (MISCELLANEOUS) ×3 IMPLANT
CONFORMERS SILICONE 5649 (OPHTHALMIC RELATED) IMPLANT
COVER BACK TABLE 60X90IN (DRAPES) IMPLANT
COVER SURGICAL LIGHT HANDLE (MISCELLANEOUS) ×3 IMPLANT
CRADLE DONUT ADULT HEAD (MISCELLANEOUS) IMPLANT
DRAIN PENROSE 1/2X12 LTX STRL (WOUND CARE) ×3 IMPLANT
DRAPE HALF SHEET 40X57 (DRAPES) IMPLANT
ELECT COATED BLADE 2.86 ST (ELECTRODE) IMPLANT
ELECT NEEDLE TIP 2.8 STRL (NEEDLE) IMPLANT
ELECT REM PT RETURN 9FT ADLT (ELECTROSURGICAL) ×3
ELECTRODE REM PT RTRN 9FT ADLT (ELECTROSURGICAL) ×1 IMPLANT
GLOVE ECLIPSE 8.0 STRL XLNG CF (GLOVE) ×6 IMPLANT
GOWN STRL REUS W/ TWL LRG LVL3 (GOWN DISPOSABLE) ×2 IMPLANT
GOWN STRL REUS W/ TWL XL LVL3 (GOWN DISPOSABLE) ×1 IMPLANT
GOWN STRL REUS W/TWL LRG LVL3 (GOWN DISPOSABLE) ×4
GOWN STRL REUS W/TWL XL LVL3 (GOWN DISPOSABLE) ×2
KIT BASIN OR (CUSTOM PROCEDURE TRAY) ×3 IMPLANT
KIT ROOM TURNOVER OR (KITS) ×3 IMPLANT
NEEDLE HYPO 25GX1X1/2 BEV (NEEDLE) IMPLANT
NS IRRIG 1000ML POUR BTL (IV SOLUTION) ×3 IMPLANT
PAD ARMBOARD 7.5X6 YLW CONV (MISCELLANEOUS) ×6 IMPLANT
PENCIL BUTTON HOLSTER BLD 10FT (ELECTRODE) ×3 IMPLANT
PLATE MNDBLE FRACTURE 14H (Plate) ×3 IMPLANT
PLATE MNDBLE MINI 16H (Orthopedic Implant) ×3 IMPLANT
SCISSORS WIRE ANG 4 3/4 DISP (INSTRUMENTS) IMPLANT
SCREW MNDBLE 2.3X10MM BONE (Screw) ×12 IMPLANT
SCREW MNDBLE 2.3X6MM BONE (Screw) ×12 IMPLANT
SCREW MNDBLE 2.3X8MM BONE (Screw) ×12 IMPLANT
STAPLER VISISTAT 35W (STAPLE) IMPLANT
SUT CHROMIC 3 0 PS 2 (SUTURE) ×6 IMPLANT
SUT CHROMIC 4 0 P 3 18 (SUTURE) IMPLANT
SUT CHROMIC 5 0 P 3 (SUTURE) IMPLANT
SUT ETHILON 2 0 FS 18 (SUTURE) ×3 IMPLANT
SUT ETHILON 5 0 P 3 18 (SUTURE)
SUT ETHILON 6 0 P 1 (SUTURE) IMPLANT
SUT NYLON ETHILON 5-0 P-3 1X18 (SUTURE) IMPLANT
SUT SILK 2 0 PERMA HAND 18 BK (SUTURE) ×3 IMPLANT
SUT STEEL 0 (SUTURE)
SUT STEEL 0 18XMFL TIE 17 (SUTURE) IMPLANT
SUT STEEL 1 (SUTURE) IMPLANT
SUT STEEL 2 (SUTURE) IMPLANT
SUT STEEL 4 (SUTURE) IMPLANT
TOWEL OR 17X24 6PK STRL BLUE (TOWEL DISPOSABLE) ×3 IMPLANT
TRAY ENT MC OR (CUSTOM PROCEDURE TRAY) ×3 IMPLANT
WATER STERILE IRR 1000ML POUR (IV SOLUTION) ×3 IMPLANT

## 2017-11-10 NOTE — Transfer of Care (Signed)
Immediate Anesthesia Transfer of Care Note  Patient: Seth [name]  Procedure(s) Performed: OPEN REDUCTION INTERNAL FIXATION (ORIF) MANDIBULAR FRACTURE POSSIBLE MAXILLARY MANDIBULAR FIXATION (N/A )  Patient Location: PACU  Anesthesia Type:General  Level of Consciousness: awake, alert , oriented and patient cooperative  Airway & Oxygen Therapy: Patient Spontanous Breathing and Patient connected to face mask oxygen  Post-op Assessment: Report given to RN and Post -op Vital signs reviewed and stable  Post vital signs: Reviewed and stable  Last Vitals:  Vitals:   11/10/17 1022 11/10/17 1415  BP:    Pulse: (!) 114   Resp:    Temp:  36.7 C  SpO2:      Last Pain:  Vitals:   11/10/17 0837  TempSrc:   PainSc: 9       Patients Stated Pain Goal: 4 (39/12/25 8346)  Complications: No apparent anesthesia complications
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
## 2017-11-10 NOTE — Progress Notes (Signed)
Patient is going off the unit to the OR. Report called to short stay
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
## 2017-11-10 NOTE — Progress Notes (Signed)
Inpatient Diabetes Program Recommendations  AACE/ADA: New Consensus Statement on Inpatient Glycemic Control (2015)  Target Ranges:  Prepandial:   less than 140 mg/dL      Peak postprandial:   less than 180 mg/dL (1-2 hours)      Critically ill patients:  140 - 180 mg/dL   Lab Results  Component Value Date   GLUCAP 220 (H) 11/10/2017    Review of Glycemic Control Results for Seth [name], Seth [name] (MRN [id number]) as of 11/10/2017 14:18  Ref. Range 11/09/2017 07:39 11/09/2017 11:35 11/09/2017 19:48 11/09/2017 21:55 11/10/2017 08:26  Glucose-Capillary Latest Ref Range: 65 - 99 mg/dL 176 (H) 150 (H) 169 (H) 272 (H) 220 (H)    Diabetes history: Type 2 DM Outpatient Diabetes medications: Novolog moderate 0-15 units tid with meals Current orders for Inpatient glycemic control:  Novolog sensitive tid with meals and HS  Inpatient Diabetes Program Recommendations:   Consider adding Levemir 10 units daily while in the hospital.   Thanks,  Adah Perl, RN, BC-ADM Inpatient Diabetes Coordinator Pager [phone] (8a-5p)

## 2017-11-10 NOTE — Anesthesia Procedure Notes (Signed)
Procedure Name: Intubation Date/Time: 11/10/2017 12:19 PM Performed by: Inda Coke, CRNA Pre-anesthesia Checklist: Patient identified, Emergency Drugs available, Suction available and Patient being monitored Patient Re-evaluated:Patient Re-evaluated prior to induction Oxygen Delivery Method: Circle System Utilized Preoxygenation: Pre-oxygenation with 100% oxygen Induction Type: IV induction Ventilation: Two handed mask ventilation required, Oral airway inserted - appropriate to patient size and Mask ventilation with difficulty Laryngoscope Size: Glidescope and 4 Grade View: Grade II Nasal Tubes: Right, Nasal prep performed and Magill forceps- large, utilized Tube size: 7.5 mm Number of attempts: 1 Airway Equipment and Method: Oral airway Placement Confirmation: ETT inserted through vocal cords under direct vision,  positive ETCO2 and breath sounds checked- equal and bilateral Tube secured with: Tape Dental Injury: Teeth and Oropharynx as per pre-operative assessment  Future Recommendations: Recommend- induction with short-acting agent, and alternative techniques readily available Comments: DL X 1 with glidescope MAC 4 blade and unable to pass nasal tube through cords. Mask ventilate pt with oral airway and two handed mask.  DL X 2 by Dr. [name] Robert with MAC 4 laryngoscope blade and unable to pass nasal ETT through cords. DL X 3 with glidescope MAC 4 blade and able to pass nasal ETT through cords successful.
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
## 2017-11-10 NOTE — Anesthesia Preprocedure Evaluation (Signed)
Anesthesia Evaluation  Patient identified by MRN, date of birth, ID band Patient awake    Reviewed: Allergy & Precautions, H&P , NPO status , Patient's Chart, lab work & pertinent test results  Airway Mallampati: IV  TM Distance: >3 FB Neck ROM: Full    Dental no notable dental hx. (+) Partial Lower, Partial Upper, Dental Advisory Given   Pulmonary sleep apnea and Continuous Positive Airway Pressure Ventilation , Current Smoker,    Pulmonary exam normal breath sounds clear to auscultation       Cardiovascular negative cardio ROS   Rhythm:Regular Rate:Normal     Neuro/Psych negative neurological ROS  negative psych ROS   GI/Hepatic negative GI ROS, Neg liver ROS,   Endo/Other  Morbid obesity  Renal/GU ESRF and DialysisRenal disease  negative genitourinary   Musculoskeletal   Abdominal   Peds  Hematology negative hematology ROS (+)   Anesthesia Other Findings   Reproductive/Obstetrics negative OB ROS                             Anesthesia Physical Anesthesia Plan  ASA: III  Anesthesia Plan: General   Post-op Pain Management:    Induction: Intravenous  PONV Risk Score and Plan: 2 and Ondansetron and Midazolam  Airway Management Planned: Oral ETT and Video Laryngoscope Planned  Additional Equipment:   Intra-op Plan:   Post-operative Plan: Extubation in OR  Informed Consent: I have reviewed the patients History and Physical, chart, labs and discussed the procedure including the risks, benefits and alternatives for the proposed anesthesia with the patient or authorized representative who has indicated his/her understanding and acceptance.   Dental advisory given  Plan Discussed with: CRNA  Anesthesia Plan Comments:         Anesthesia Quick Evaluation

## 2017-11-11 ENCOUNTER — Inpatient Hospital Stay (HOSPITAL_COMMUNITY): Payer: Medicare Other

## 2017-11-11 LAB — BASIC METABOLIC PANEL
ANION GAP: 17 — AB (ref 5–15)
BUN: 56 mg/dL — ABNORMAL HIGH (ref 6–20)
CO2: 21 mmol/L — ABNORMAL LOW (ref 22–32)
Calcium: 7.9 mg/dL — ABNORMAL LOW (ref 8.9–10.3)
Chloride: 93 mmol/L — ABNORMAL LOW (ref 101–111)
Creatinine, Ser: 12.44 mg/dL — ABNORMAL HIGH (ref 0.61–1.24)
GFR calc Af Amer: 5 mL/min — ABNORMAL LOW (ref >60)
GFR calc non Af Amer: 4 mL/min — ABNORMAL LOW (ref >60)
GLUCOSE: 164 mg/dL — AB (ref 65–99)
POTASSIUM: 5 mmol/L (ref 3.5–5.1)
Sodium: 131 mmol/L — ABNORMAL LOW (ref 135–145)

## 2017-11-11 LAB — CBC
HEMATOCRIT: 21.6 % — AB (ref 39.0–52.0)
Hemoglobin: 7.2 g/dL — ABNORMAL LOW (ref 13.0–17.0)
MCH: 31.9 pg (ref 26.0–34.0)
MCHC: 33.3 g/dL (ref 30.0–36.0)
MCV: 95.6 fL (ref 78.0–100.0)
Platelets: 252 10*3/uL (ref 150–400)
RBC: 2.26 MIL/uL — AB (ref 4.22–5.81)
RDW: 13.8 % (ref 11.5–15.5)
WBC: 15.2 10*3/uL — AB (ref 4.0–10.5)

## 2017-11-11 LAB — GLUCOSE, CAPILLARY
Glucose-Capillary: 182 mg/dL — ABNORMAL HIGH (ref 65–99)
Glucose-Capillary: 156 mg/dL — ABNORMAL HIGH (ref 65–99)
Glucose-Capillary: 123 mg/dL — ABNORMAL HIGH (ref 65–99)
Glucose-Capillary: 142 mg/dL — ABNORMAL HIGH (ref 65–99)

## 2017-11-11 MED ORDER — SODIUM CHLORIDE 0.9 % IV SOLN: 100.0000 mL | INTRAVENOUS | Status: DC | PRN

## 2017-11-11 MED ORDER — SODIUM CHLORIDE 0.9 % IV SOLN
10.0000 mL/h | INTRAVENOUS | Status: DC
  Administered 2017-11-13: 10 mL/h via INTRAVENOUS
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
## 2017-11-12 ENCOUNTER — Inpatient Hospital Stay (HOSPITAL_COMMUNITY): Payer: Medicare Other

## 2017-11-12 LAB — GLUCOSE, CAPILLARY
GLUCOSE-CAPILLARY: 143 mg/dL — AB (ref 65–99)
GLUCOSE-CAPILLARY: 149 mg/dL — AB (ref 65–99)
GLUCOSE-CAPILLARY: 134 mg/dL — AB (ref 65–99)
GLUCOSE-CAPILLARY: 207 mg/dL — AB (ref 65–99)
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
## 2017-11-12 NOTE — Plan of Care (Signed)
  Health Behavior/Discharge Planning: Ability to manage health-related needs will improve 11/12/2017 2012 - Progressing by Irish Lack, RN   Education: Knowledge of General Education information will improve 11/12/2017 2012 - Progressing by Irish Lack, RN   Pain Managment: General experience of comfort will improve 11/12/2017 2012 - Progressing by Irish Lack, RN
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
## 2017-11-12 NOTE — Progress Notes (Addendum)
Pt transferred to 5M15. Pt stable alert and oriented.
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
## 2017-11-13 ENCOUNTER — Encounter (HOSPITAL_COMMUNITY): Payer: Self-pay | Admitting: Otolaryngology

## 2017-11-13 LAB — CBC
HCT: 20.9 % — ABNORMAL LOW (ref 39.0–52.0)
HEMOGLOBIN: 6.8 g/dL — AB (ref 13.0–17.0)
MCH: 30.6 pg (ref 26.0–34.0)
MCHC: 32.5 g/dL (ref 30.0–36.0)
MCV: 94.1 fL (ref 78.0–100.0)
Platelets: 309 10*3/uL (ref 150–400)
RBC: 2.22 MIL/uL — AB (ref 4.22–5.81)
RDW: 13.8 % (ref 11.5–15.5)
WBC: 14.7 10*3/uL — AB (ref 4.0–10.5)

## 2017-11-13 LAB — GLUCOSE, CAPILLARY
Glucose-Capillary: 174 mg/dL — ABNORMAL HIGH (ref 65–99)
Glucose-Capillary: 129 mg/dL — ABNORMAL HIGH (ref 65–99)
Glucose-Capillary: 160 mg/dL — ABNORMAL HIGH (ref 65–99)

## 2017-11-13 LAB — RENAL FUNCTION PANEL
ALBUMIN: 3 g/dL — AB (ref 3.5–5.0)
ANION GAP: 17 — AB (ref 5–15)
BUN: 80 mg/dL — ABNORMAL HIGH (ref 6–20)
CALCIUM: 8.1 mg/dL — AB (ref 8.9–10.3)
CO2: 20 mmol/L — AB (ref 22–32)
Chloride: 94 mmol/L — ABNORMAL LOW (ref 101–111)
Creatinine, Ser: 16.33 mg/dL — ABNORMAL HIGH (ref 0.61–1.24)
GFR calc non Af Amer: 3 mL/min — ABNORMAL LOW (ref >60)
GFR, EST AFRICAN AMERICAN: 3 mL/min — AB (ref >60)
Glucose, Bld: 144 mg/dL — ABNORMAL HIGH (ref 65–99)
PHOSPHORUS: 9.2 mg/dL — AB (ref 2.5–4.6)
Potassium: 5 mmol/L (ref 3.5–5.1)
SODIUM: 131 mmol/L — AB (ref 135–145)

## 2017-11-13 LAB — PREPARE RBC (CROSSMATCH)

## 2017-11-13 MED ORDER — HYDROMORPHONE HCL 1 MG/ML IJ SOLN
INTRAMUSCULAR | Status: AC
  Filled 2017-11-13: qty 1

## 2017-11-13 MED ORDER — SODIUM CHLORIDE 0.9 % IV SOLN: Freq: Once | INTRAVENOUS | Status: DC
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
## 2017-11-13 NOTE — Anesthesia Postprocedure Evaluation (Addendum)
Anesthesia Post Note  Patient: Seth [name]  Procedure(s) Performed: OPEN REDUCTION INTERNAL FIXATION (ORIF) MANDIBULAR FRACTURE POSSIBLE MAXILLARY MANDIBULAR FIXATION (N/A )     Patient location during evaluation: PACU Anesthesia Type: General Level of consciousness: awake Pain management: pain level controlled Vital Signs Assessment: post-procedure vital signs reviewed and stable Respiratory status: spontaneous breathing Cardiovascular status: stable Postop Assessment: no apparent nausea or vomiting Anesthetic complications: no    Last Vitals:  Vitals:   11/13/17 0543 11/13/17 0822  BP: (!) 161/93 (!) 150/77  Pulse: 82 95  Resp: 20 20  Temp: 37 C 37.1 C  SpO2: 97% 92%    Last Pain:  Vitals:   11/13/17 0822  TempSrc: Oral  PainSc:    Pain Goal: Patients Stated Pain Goal: 9 (11/13/17 0106)               Deerica Waszak JR,JOHN Mateo Flow
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
## 2017-11-13 NOTE — Progress Notes (Signed)
11/13/2017 8:44 AM  Florina Ou [phone]  Post-Op Day 3    Temp:  [98.1 F (36.7 C)-98.7 F (37.1 C)] 98.7 F (37.1 C) (02/04 0822) Pulse Rate:  [82-104] 95 (02/04 0822) Resp:  [18-20] 20 (02/04 0822) BP: (149-161)/(77-145) 150/77 (02/04 0822) SpO2:  [92 %-97 %] 92 % (02/04 0822),     Intake/Output Summary (Last 24 hours) at 11/13/2017 0844 Last data filed at 11/13/2017 0600 Gross per 24 hour  Intake 580 ml  Output 0 ml  Net 580 ml    Results for orders placed or performed during the hospital encounter of 11/06/17 (from the past 24 hour(s))  Glucose, capillary     Status: Abnormal   Collection Time: 11/12/17 12:18 PM  Result Value Ref Range   Glucose-Capillary 149 (H) 65 - 99 mg/dL  Glucose, capillary     Status: Abnormal   Collection Time: 11/12/17  4:56 PM  Result Value Ref Range   Glucose-Capillary 134 (H) 65 - 99 mg/dL  Glucose, capillary     Status: Abnormal   Collection Time: 11/12/17  9:06 PM  Result Value Ref Range   Glucose-Capillary 207 (H) 65 - 99 mg/dL  Glucose, capillary     Status: Abnormal   Collection Time: 11/13/17  7:28 AM  Result Value Ref Range   Glucose-Capillary 174 (H) 65 - 99 mg/dL   Post Op CT with 3D recon looks good.  SUBJECTIVE:  Mod pain, controlled.  Still mod-large oozing from Penrose  OBJECTIVE:  Min trismus.  Min swelling ext and int.  IMPRESSION:  Satisfactory check  PLAN:   Could go home with Penrose in place to be removed in my office later this week.  Needs po Clinda, Hydrocodone please.   Seth [name]

## 2017-11-13 NOTE — Procedures (Signed)
Patient seen on Hemodialysis. QB 400, UF goal 6L Treatment adjusted as needed.  Elmarie Shiley MD [facility]. Office # [phone] Pager # [phone] 3:22 PM

## 2017-11-13 NOTE — Progress Notes (Addendum)
Paged via [facility] # listed in note. Hgb 7.2 on 11/11/17. Awaiting response.    1127 Per Trauma MD, pt to have CBC drawn at HD today.  Paulla Fore, RN

## 2017-11-13 NOTE — Care Management Important Message (Signed)
Important Message  Patient Details  Name: Seth [name] MRN: [id number] Date of Birth: [birth year]   Medicare Important Message Given:  Yes    Abisai Coble P [place] 11/13/2017, 3:52 PM

## 2017-11-13 NOTE — Progress Notes (Signed)
CRITICAL VALUE STICKER  CRITICAL VALUE: Hgb= 6.8  RECEIVER (on-site recipient of call): Emilio Math, RN   [place] NOTIFIED: [phone] 11/13/17   MESSENGER (representative from lab): Thayer Jew   MD NOTIFIED: Posey Pronto MD   TIME OF NOTIFICATION: 7078  RESPONSE: Awaiting response.

## 2017-11-14 LAB — CBC
HCT: 25.7 % — ABNORMAL LOW (ref 39.0–52.0)
HEMOGLOBIN: 8.7 g/dL — AB (ref 13.0–17.0)
MCH: 31.2 pg (ref 26.0–34.0)
MCHC: 33.9 g/dL (ref 30.0–36.0)
MCV: 92.1 fL (ref 78.0–100.0)
PLATELETS: 290 10*3/uL (ref 150–400)
RBC: 2.79 MIL/uL — ABNORMAL LOW (ref 4.22–5.81)
RDW: 15.1 % (ref 11.5–15.5)
WBC: 15.4 10*3/uL — AB (ref 4.0–10.5)

## 2017-11-14 LAB — BPAM RBC
BLOOD PRODUCT EXPIRATION DATE: 201902192359
BLOOD PRODUCT EXPIRATION DATE: 201902222359
ISSUE DATE / TIME: 201902041728
ISSUE DATE / TIME: 201902041728
UNIT TYPE AND RH: 7300
Unit Type and Rh: 7300

## 2017-11-14 LAB — TYPE AND SCREEN
ABO/RH(D): B POS
ANTIBODY SCREEN: NEGATIVE
UNIT DIVISION: 0
Unit division: 0

## 2017-11-14 LAB — GLUCOSE, CAPILLARY
GLUCOSE-CAPILLARY: 137 mg/dL — AB (ref 65–99)
Glucose-Capillary: 133 mg/dL — ABNORMAL HIGH (ref 65–99)

## 2017-11-14 MED ORDER — CLINDAMYCIN HCL 300 MG PO CAPS: 300.0000 mg | ORAL_CAPSULE | Freq: Three times a day (TID) | ORAL | 0 refills | Status: DC

## 2017-11-14 MED ORDER — BACITRACIN ZINC 500 UNIT/GM EX OINT: 1.0000 "application " | TOPICAL_OINTMENT | Freq: Three times a day (TID) | CUTANEOUS | 0 refills | Status: DC

## 2017-11-14 MED ORDER — HYDROCODONE-ACETAMINOPHEN 7.5-325 MG PO TABS: 1.0000 | ORAL_TABLET | Freq: Four times a day (QID) | ORAL | 0 refills | Status: DC | PRN

## 2017-11-14 MED FILL — HYDROCODON-APAP 7.5-325: 7.5-325 | 7 days supply | Qty: 30 | Fill #0

## 2017-11-14 MED FILL — CLINDAMYCIN HCL 300 MG CAPS: 300 | 7 days supply | Qty: 21 | Fill #0
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
## 2017-11-14 NOTE — Progress Notes (Signed)
Pt given discharge instructions with understanding. No questions at this time. Pt has no IV access at time of discharge paperwork given. Family at bedside.

## 2017-11-15 ENCOUNTER — Other Ambulatory Visit: Payer: Self-pay | Admitting: Internal Medicine

## 2017-11-15 DIAGNOSIS — E1122 Type 2 diabetes mellitus with diabetic chronic kidney disease: Secondary | ICD-10-CM

## 2017-11-15 DIAGNOSIS — N186 End stage renal disease: Principal | ICD-10-CM

## 2017-11-15 DIAGNOSIS — Z992 Dependence on renal dialysis: Principal | ICD-10-CM

## 2017-11-15 DIAGNOSIS — I10 Essential (primary) hypertension: Secondary | ICD-10-CM

## 2017-11-17 ENCOUNTER — Encounter: Payer: Self-pay | Admitting: Internal Medicine

## 2017-11-17 MED FILL — CLINDAMYCIN HCL 300 MG CAPS: 300 | 7 days supply | Qty: 21 | Fill #0

## 2017-11-21 ENCOUNTER — Other Ambulatory Visit: Payer: Self-pay

## 2017-11-21 DIAGNOSIS — E1122 Type 2 diabetes mellitus with diabetic chronic kidney disease: Secondary | ICD-10-CM

## 2017-11-21 DIAGNOSIS — S1190XA Unspecified open wound of unspecified part of neck, initial encounter: Secondary | ICD-10-CM | POA: Diagnosis not present

## 2017-11-21 DIAGNOSIS — E1169 Type 2 diabetes mellitus with other specified complication: Secondary | ICD-10-CM

## 2017-11-21 DIAGNOSIS — Z992 Dependence on renal dialysis: Secondary | ICD-10-CM

## 2017-11-21 DIAGNOSIS — E785 Hyperlipidemia, unspecified: Principal | ICD-10-CM

## 2017-11-21 DIAGNOSIS — N186 End stage renal disease: Secondary | ICD-10-CM

## 2017-11-21 MED ORDER — ATORVASTATIN CALCIUM 40 MG PO TABS: 40.0000 mg | ORAL_TABLET | Freq: Every day | ORAL | 3 refills | Status: DC

## 2017-11-21 MED ORDER — GLIMEPIRIDE 2 MG PO TABS: 2.0000 mg | ORAL_TABLET | Freq: Every day | ORAL | 2 refills | Status: DC

## 2017-11-21 MED ORDER — AMLODIPINE BESYLATE 10 MG PO TABS: 10.0000 mg | ORAL_TABLET | Freq: Every day | ORAL | 1 refills | Status: DC

## 2017-11-21 MED ORDER — INSULIN ASPART 100 UNIT/ML ~~LOC~~ SOLN
0.0000 [IU] | Freq: Three times a day (TID) | SUBCUTANEOUS | 12 refills | Status: DC
Start: 2017-11-21 — End: 2018-02-28

## 2017-11-21 MED FILL — HYDROCODON-APAP 7.5-325: 7.5-325 | 3 days supply | Qty: 15 | Fill #0

## 2017-11-21 NOTE — Telephone Encounter (Signed)
Refill request on all meds @ outpatient pharmacy.

## 2017-11-22 MED FILL — ATORVASTATIN 40 MG TABLET: 40 | 90 days supply | Qty: 90 | Fill #0

## 2017-11-22 MED FILL — GLIMEPIRIDE 2 MG TABLET: 2 | 30 days supply | Qty: 30 | Fill #0

## 2017-11-22 MED FILL — AMLODIPINE BESYLATE 10 MG T: 10 | 90 days supply | Qty: 90 | Fill #0

## 2017-12-28 ENCOUNTER — Ambulatory Visit (INDEPENDENT_AMBULATORY_CARE_PROVIDER_SITE_OTHER): Payer: Medicare Other | Admitting: Internal Medicine

## 2017-12-28 ENCOUNTER — Other Ambulatory Visit: Payer: Self-pay

## 2017-12-28 VITALS — BP 193/89 | HR 77 | Temp 97.9°F | Ht 67.0 in | Wt 318.2 lb

## 2017-12-28 DIAGNOSIS — E1159 Type 2 diabetes mellitus with other circulatory complications: Secondary | ICD-10-CM

## 2017-12-28 DIAGNOSIS — W3400XA Accidental discharge from unspecified firearms or gun, initial encounter: Secondary | ICD-10-CM

## 2017-12-28 DIAGNOSIS — Z992 Dependence on renal dialysis: Secondary | ICD-10-CM

## 2017-12-28 DIAGNOSIS — Z79899 Other long term (current) drug therapy: Secondary | ICD-10-CM | POA: Diagnosis not present

## 2017-12-28 DIAGNOSIS — I10 Essential (primary) hypertension: Secondary | ICD-10-CM

## 2017-12-28 DIAGNOSIS — F515 Nightmare disorder: Secondary | ICD-10-CM | POA: Diagnosis not present

## 2017-12-28 DIAGNOSIS — F419 Anxiety disorder, unspecified: Secondary | ICD-10-CM | POA: Diagnosis not present

## 2017-12-28 DIAGNOSIS — G9520 Unspecified cord compression: Secondary | ICD-10-CM | POA: Diagnosis not present

## 2017-12-28 DIAGNOSIS — Z87828 Personal history of other (healed) physical injury and trauma: Secondary | ICD-10-CM

## 2017-12-28 DIAGNOSIS — Z79891 Long term (current) use of opiate analgesic: Secondary | ICD-10-CM

## 2017-12-28 DIAGNOSIS — F17200 Nicotine dependence, unspecified, uncomplicated: Secondary | ICD-10-CM

## 2017-12-28 MED ORDER — OXYCODONE HCL 5 MG PO TABS: 5.0000 mg | ORAL_TABLET | Freq: Two times a day (BID) | ORAL | 0 refills | Status: DC | PRN

## 2017-12-28 MED FILL — oxyCODONE HCL 5 MG TABS: 5 | 30 days supply | Qty: 60 | Fill #0
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
## 2017-12-29 NOTE — Progress Notes (Signed)
Internal Medicine Clinic Attending  Case discussed with Dr. Melvin  at the time of the visit.  We reviewed the resident's history and exam and pertinent patient test results.  I agree with the assessment, diagnosis, and plan of care documented in the resident's note.  

## 2018-01-15 MED FILL — GLIMEPIRIDE 2 MG TABLET: 2 | 30 days supply | Qty: 30 | Fill #1

## 2018-01-25 ENCOUNTER — Ambulatory Visit (INDEPENDENT_AMBULATORY_CARE_PROVIDER_SITE_OTHER): Payer: Medicare Other | Admitting: Internal Medicine

## 2018-01-25 ENCOUNTER — Other Ambulatory Visit: Payer: Self-pay

## 2018-01-25 VITALS — BP 183/89 | HR 88 | Temp 97.6°F | Wt 318.8 lb

## 2018-01-25 DIAGNOSIS — Z79891 Long term (current) use of opiate analgesic: Secondary | ICD-10-CM

## 2018-01-25 DIAGNOSIS — M4644 Discitis, unspecified, thoracic region: Secondary | ICD-10-CM | POA: Diagnosis not present

## 2018-01-25 DIAGNOSIS — G8929 Other chronic pain: Secondary | ICD-10-CM | POA: Diagnosis present

## 2018-01-25 DIAGNOSIS — Z87891 Personal history of nicotine dependence: Secondary | ICD-10-CM

## 2018-01-25 DIAGNOSIS — N186 End stage renal disease: Secondary | ICD-10-CM | POA: Diagnosis not present

## 2018-01-25 DIAGNOSIS — G9520 Unspecified cord compression: Secondary | ICD-10-CM

## 2018-01-25 DIAGNOSIS — M4624 Osteomyelitis of vertebra, thoracic region: Secondary | ICD-10-CM | POA: Diagnosis not present

## 2018-01-25 DIAGNOSIS — Z992 Dependence on renal dialysis: Secondary | ICD-10-CM

## 2018-01-25 MED ORDER — OXYCODONE-ACETAMINOPHEN 5-325 MG PO TABS: 1.0000 | ORAL_TABLET | Freq: Two times a day (BID) | ORAL | 0 refills | Status: DC | PRN

## 2018-01-25 NOTE — Patient Instructions (Addendum)
FOLLOW-UP INSTRUCTIONS When: PCP next available (June 19th) For: Pain contract What to bring: Medications  [name],  It was a pleasure to meet you. I am sorry to hear about your back pain. I sent refills of your oxycodone to your [facility] pharmacy. Please follow up with you primary care doctor on there next available appointment to sign a controlled substance contract. If you have any questions or concerns, call our clinic at [phone] or after hours call [phone] and ask for the internal medicine resident on call. Thank you!  - Dr. Philipp Ovens

## 2018-01-25 NOTE — Assessment & Plan Note (Signed)
Patient has been receiving oxycodone from our clinic now for approximately 6 months.  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  He continues to have back pain, but reports adequate relief with twice daily PRN oxycodone.  He has not signed a controlled substance contract with our clinic.  I reviewed the database today, patient failed 3 separate prescriptions in February, 2 for hydrocodone-acetaminophen (#30 & #15) and one prescription for oxycodone 5 mg (#30), all from 3 separate providers.  Patient initially denied any knowledge of these prescriptions, but when presented with the database information stated that he does not remember but may have been prescriptions from his recent hospitalization.  Today we went over expectations for controlled substance contract if he wishes to continue receiving oxycodone from our clinic.  Informed patient that he will be expected to receive her scription's only from our clinic, and inform us prior to filling any outside prescriptions.  He is aware that we reserve the right to drug test him at random and that he must fill with the same pharmacy every month.  Of note he is ESRD on dialysis and would require a blood tox screen.  He is agreeable to these terms.  I have changed his oxycodone IR prescription to oxycodone-acetaminophen 5-325 mg to reduce abuse potential.  He will follow-up next available PCP appointment for pain contract.  -- Oxycodone-acetaminophen 5-325 mg BID prn (#60), 2 refills provided -- Follow-up PCP in June for next available appointment -- Pain contract at follow up
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
## 2018-01-29 NOTE — Progress Notes (Signed)
Case discussed with Dr. Guilloud at the time of the visit.  We reviewed the resident's history and exam and pertinent patient test results.  I agree with the assessment, diagnosis and plan of care documented in the resident's note. 

## 2018-02-06 ENCOUNTER — Ambulatory Visit: Payer: Medicare Other | Admitting: Clinical

## 2018-02-27 ENCOUNTER — Other Ambulatory Visit: Payer: Self-pay | Admitting: Internal Medicine

## 2018-02-27 DIAGNOSIS — Z992 Dependence on renal dialysis: Principal | ICD-10-CM

## 2018-02-27 DIAGNOSIS — E1122 Type 2 diabetes mellitus with diabetic chronic kidney disease: Secondary | ICD-10-CM

## 2018-02-27 DIAGNOSIS — N186 End stage renal disease: Principal | ICD-10-CM

## 2018-02-28 MED ORDER — INSULIN ASPART 100 UNIT/ML ~~LOC~~ SOLN: 0.0000 [IU] | Freq: Three times a day (TID) | SUBCUTANEOUS | 12 refills | Status: DC

## 2018-03-01 ENCOUNTER — Other Ambulatory Visit: Payer: Self-pay | Admitting: Internal Medicine

## 2018-03-01 DIAGNOSIS — G9520 Unspecified cord compression: Secondary | ICD-10-CM

## 2018-03-01 NOTE — Telephone Encounter (Signed)
Patient is calling requesting refill on pain medicine

## 2018-03-01 NOTE — Telephone Encounter (Signed)
Confirmed w/ pharm, pt has scripts on file

## 2018-03-02 NOTE — Telephone Encounter (Signed)
Spoke with pharmacist at [facility] who states received electronic refill sent on 03/01/2018 for novolog flexpen with different directions and it is ready for pick up. Hubbard Hartshorn, RN, BSN

## 2018-03-12 ENCOUNTER — Other Ambulatory Visit: Payer: Self-pay | Admitting: Internal Medicine

## 2018-03-12 MED FILL — NOVOLOG FLEXPEN SYRINGE: 100 | 25 days supply | Qty: 15 | Fill #0

## 2018-03-12 NOTE — Telephone Encounter (Signed)
Confirmed with patient that he has sliding scale directions as listed below. Hubbard Hartshorn, RN, BSN

## 2018-03-22 MED FILL — GLIMEPIRIDE 2 MG TABLET: 2 | 30 days supply | Qty: 30 | Fill #2

## 2018-03-28 ENCOUNTER — Other Ambulatory Visit: Payer: Self-pay

## 2018-03-28 ENCOUNTER — Ambulatory Visit (INDEPENDENT_AMBULATORY_CARE_PROVIDER_SITE_OTHER): Payer: Medicare Other | Admitting: Internal Medicine

## 2018-03-28 ENCOUNTER — Encounter (INDEPENDENT_AMBULATORY_CARE_PROVIDER_SITE_OTHER): Payer: Self-pay

## 2018-03-28 ENCOUNTER — Encounter: Payer: Self-pay | Admitting: Internal Medicine

## 2018-03-28 VITALS — BP 149/97 | HR 90 | Temp 98.1°F | Ht 67.0 in | Wt 315.8 lb

## 2018-03-28 DIAGNOSIS — Z992 Dependence on renal dialysis: Secondary | ICD-10-CM | POA: Diagnosis not present

## 2018-03-28 DIAGNOSIS — E1122 Type 2 diabetes mellitus with diabetic chronic kidney disease: Secondary | ICD-10-CM

## 2018-03-28 DIAGNOSIS — I10 Essential (primary) hypertension: Secondary | ICD-10-CM

## 2018-03-28 DIAGNOSIS — I120 Hypertensive chronic kidney disease with stage 5 chronic kidney disease or end stage renal disease: Secondary | ICD-10-CM

## 2018-03-28 DIAGNOSIS — I152 Hypertension secondary to endocrine disorders: Secondary | ICD-10-CM

## 2018-03-28 DIAGNOSIS — N186 End stage renal disease: Secondary | ICD-10-CM | POA: Diagnosis not present

## 2018-03-28 DIAGNOSIS — G9520 Unspecified cord compression: Secondary | ICD-10-CM

## 2018-03-28 DIAGNOSIS — E1159 Type 2 diabetes mellitus with other circulatory complications: Secondary | ICD-10-CM | POA: Diagnosis not present

## 2018-03-28 LAB — GLUCOSE, CAPILLARY: Glucose-Capillary: 185 mg/dL — ABNORMAL HIGH (ref 65–99)

## 2018-03-28 LAB — POCT GLYCOSYLATED HEMOGLOBIN (HGB A1C): Hemoglobin A1C: 8.1 % — AB (ref 4.0–5.6)

## 2018-03-28 MED ORDER — OXYCODONE-ACETAMINOPHEN 10-325 MG PO TABS: 1.0000 | ORAL_TABLET | Freq: Two times a day (BID) | ORAL | 0 refills | Status: DC | PRN

## 2018-03-28 MED ORDER — SENNA 8.6 MG PO TABS: 1.0000 | ORAL_TABLET | Freq: Every day | ORAL | 0 refills | Status: DC

## 2018-03-28 MED ORDER — DICLOFENAC SODIUM 1 % TD GEL: 4.0000 g | Freq: Four times a day (QID) | TRANSDERMAL | 2 refills | Status: DC

## 2018-03-28 MED ORDER — HYDRALAZINE HCL 25 MG PO TABS: 25.0000 mg | ORAL_TABLET | Freq: Two times a day (BID) | ORAL | 2 refills | Status: DC

## 2018-03-28 MED FILL — DICLOFENAC SODIUM 1% GEL: 1 | 6 days supply | Qty: 100 | Fill #0

## 2018-03-28 MED FILL — hydrALAZINE HCL 25 MG TABS: 25 | 30 days supply | Qty: 60 | Fill #0

## 2018-03-28 MED FILL — OXYCODONE-ACETAMINOPHEN 10-: 10-325 | 30 days supply | Qty: 60 | Fill #0

## 2018-03-28 NOTE — Assessment & Plan Note (Addendum)
His last A1c was 7.9 on 10/11/2017.  He is currently using NovoLog sliding scale insulin 3 times daily with meals.  He did not bring his meter today.  He is checking his blood sugars at home and says most readings are between 150-200s.  He has seen a high in the 300s.  He denies any symptoms of hyperglycemia or hypoglycemia. A/P: Repeat A1c today is 8.1.  His glycemic control is higher than goal.  I did discuss adding basal insulin on to help improve his 24-hour glycemic control however he is resistant and wants to work on improving his diet as he was to limit the amount times he is sticking himself.  Advised he continue his current NovoLog sliding scale insulin 3 times daily before meals for now and follow-up with [place] in 4 weeks and bring his meter at that time.
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
## 2018-03-28 NOTE — Assessment & Plan Note (Addendum)
After just undergoing dialysis.He is currently taking amlodipine 10 mg daily.  His BP on arrival was 149/97. BP Readings from Last 3 Encounters:  03/28/18 (!) 149/97  01/25/18 (!) 183/89  12/28/17 (!) 193/89   A/P: Blood pressure is elevated directly after dialysis. His BP was significantly elevated on last two clinic visits which were non-dialysis days. He is currently taking Amlodipine 10 mg daily. I will add Hydralazine 25 mg BID and we can titrate up as needed for BP control.

## 2018-03-28 NOTE — Patient Instructions (Addendum)
It was a pleasure to see you [name].  I have increased your pain medicine to Oxycodone-Acetaminophen 10-325 mg twice a day as needed. Please follow up with [place] again in about 4 weeks to assess your pain control.  Your Hemoglobin A1c is 8.1 today. Please work on [employer], otherwise we should consider adding long acting insulin if your A1c stays high.  All the best, Dr. Posey Pronto Diabetes Mellitus and Nutrition When you have diabetes (diabetes mellitus), it is very important to have healthy eating habits because your blood sugar (glucose) levels are greatly affected by what you eat and drink. Eating healthy foods in the appropriate amounts, at about the same times every day, can help you:  Control your blood glucose.  Lower your risk of heart disease.  Improve your blood pressure.  Reach or maintain a healthy weight.  Every person with diabetes is different, and each person has different needs for a meal plan. Your health care provider may recommend that you work with a diet and nutrition specialist (dietitian) to make a meal plan that is best for you. Your meal plan may vary depending on factors such as:  The calories you need.  The medicines you take.  Your weight.  Your blood glucose, blood pressure, and cholesterol levels.  Your activity level.  Other health conditions you have, such as heart or kidney disease.  How do carbohydrates affect me? Carbohydrates affect your blood glucose level more than any other type of food. Eating carbohydrates naturally increases the amount of glucose in your blood. Carbohydrate counting is a method for keeping track of how many carbohydrates you eat. Counting carbohydrates is important to keep your blood glucose at a healthy level, especially if you use insulin or take certain oral diabetes medicines. It is important to know how many carbohydrates you can safely have in each meal. This is different for every person. Your dietitian can help you  calculate how many carbohydrates you should have at each meal and for snack. Foods that contain carbohydrates include:  Bread, cereal, rice, pasta, and crackers.  Potatoes and corn.  Peas, beans, and lentils.  Milk and yogurt.  Fruit and juice.  Desserts, such as cakes, cookies, ice cream, and candy.  How does alcohol affect me? Alcohol can cause a sudden decrease in blood glucose (hypoglycemia), especially if you use insulin or take certain oral diabetes medicines. Hypoglycemia can be a life-threatening condition. Symptoms of hypoglycemia (sleepiness, dizziness, and confusion) are similar to symptoms of having too much alcohol. If your health care provider says that alcohol is safe for you, follow these guidelines:  Limit alcohol intake to no more than 1 drink per day for nonpregnant women and 2 drinks per day for men. One drink equals 12 oz of beer, 5 oz of wine, or 1 oz of hard liquor.  Do not drink on an empty stomach.  Keep yourself hydrated with water, diet soda, or unsweetened iced tea.  Keep in mind that regular soda, juice, and other mixers may contain a lot of sugar and must be counted as carbohydrates.  What are tips for following this plan? Reading food labels  Start by checking the serving size on the label. The amount of calories, carbohydrates, fats, and other nutrients listed on the label are based on one serving of the food. Many foods contain more than one serving per package.  Check the total grams (g) of carbohydrates in one serving. You can calculate the number of servings of  carbohydrates in one serving by dividing the total carbohydrates by 15. For example, if a food has 30 g of total carbohydrates, it would be equal to 2 servings of carbohydrates.  Check the number of grams (g) of saturated and trans fats in one serving. Choose foods that have low or no amount of these fats.  Check the number of milligrams (mg) of sodium in one serving. Most people should  limit total sodium intake to less than 2,300 mg per day.  Always check the nutrition information of foods labeled as "low-fat" or "nonfat". These foods may be higher in added sugar or refined carbohydrates and should be avoided.  Talk to your dietitian to identify your daily goals for nutrients listed on the label. Shopping  Avoid buying canned, premade, or processed foods. These foods tend to be high in fat, sodium, and added sugar.  Shop around the outside edge of the grocery store. This includes fresh fruits and vegetables, bulk grains, fresh meats, and fresh dairy. Cooking  Use low-heat cooking methods, such as baking, instead of high-heat cooking methods like deep frying.  Cook using healthy oils, such as olive, canola, or sunflower oil.  Avoid cooking with butter, cream, or high-fat meats. Meal planning  Eat meals and snacks regularly, preferably at the same times every day. Avoid going long periods of time without eating.  Eat foods high in fiber, such as fresh fruits, vegetables, beans, and whole grains. Talk to your dietitian about how many servings of carbohydrates you can eat at each meal.  Eat 4-6 ounces of lean protein each day, such as lean meat, chicken, fish, eggs, or tofu. 1 ounce is equal to 1 ounce of meat, chicken, or fish, 1 egg, or 1/4 cup of tofu.  Eat some foods each day that contain healthy fats, such as avocado, nuts, seeds, and fish. Lifestyle   Check your blood glucose regularly.  Exercise at least 30 minutes 5 or more days each week, or as told by your health care provider.  Take medicines as told by your health care provider.  Do not use any products that contain nicotine or tobacco, such as cigarettes and e-cigarettes. If you need help quitting, ask your health care provider.  Work with a Social worker or diabetes educator to identify strategies to manage stress and any emotional and social challenges. What are some questions to ask my health care  provider?  Do I need to meet with a diabetes educator?  Do I need to meet with a dietitian?  What number can I call if I have questions?  When are the best times to check my blood glucose? Where to find more information:  American Diabetes Association: diabetes.org/food-and-fitness/food  Academy of Nutrition and Dietetics: PokerClues.dk  [employer] of Diabetes and Digestive and Kidney Diseases (NIH): ContactWire.be Summary  A healthy meal plan will help you control your blood glucose and maintain a healthy lifestyle.  Working with a diet and nutrition specialist (dietitian) can help you make a meal plan that is best for you.  Keep in mind that carbohydrates and alcohol have immediate effects on your blood glucose levels. It is important to count carbohydrates and to use alcohol carefully. This information is not intended to replace advice given to you by your health care provider. Make sure you discuss any questions you have with your health care provider. Document Released: 06/23/2005 Document Revised: 10/31/2016 Document Reviewed: 10/31/2016 Elsevier Interactive Patient Education  [employer].

## 2018-03-28 NOTE — Assessment & Plan Note (Signed)
He is ESRD on MWF HD and reports tolerating it well with last session prior to this visit. His access is via a RUE AVF. A/P: Tolerating HD well. Continue HD per Nephrology.

## 2018-04-04 NOTE — Progress Notes (Signed)
Internal Medicine Clinic Attending  Case discussed with Dr. Patel at the time of the visit.  We reviewed the resident's history and exam and pertinent patient test results.  I agree with the assessment, diagnosis, and plan of care documented in the resident's note.  

## 2018-04-20 ENCOUNTER — Telehealth: Payer: Self-pay | Admitting: Internal Medicine

## 2018-04-25 ENCOUNTER — Ambulatory Visit: Payer: Self-pay

## 2018-04-26 ENCOUNTER — Encounter: Payer: Self-pay | Admitting: Internal Medicine

## 2018-04-26 ENCOUNTER — Ambulatory Visit (INDEPENDENT_AMBULATORY_CARE_PROVIDER_SITE_OTHER): Payer: Medicare Other | Admitting: Internal Medicine

## 2018-04-26 VITALS — BP 109/79 | HR 92 | Temp 97.6°F | Wt 317.0 lb

## 2018-04-26 DIAGNOSIS — Z992 Dependence on renal dialysis: Secondary | ICD-10-CM

## 2018-04-26 DIAGNOSIS — Z833 Family history of diabetes mellitus: Secondary | ICD-10-CM

## 2018-04-26 DIAGNOSIS — Z794 Long term (current) use of insulin: Secondary | ICD-10-CM

## 2018-04-26 DIAGNOSIS — G8929 Other chronic pain: Secondary | ICD-10-CM

## 2018-04-26 DIAGNOSIS — N186 End stage renal disease: Secondary | ICD-10-CM

## 2018-04-26 DIAGNOSIS — G9520 Unspecified cord compression: Secondary | ICD-10-CM

## 2018-04-26 DIAGNOSIS — Z87891 Personal history of nicotine dependence: Secondary | ICD-10-CM | POA: Diagnosis not present

## 2018-04-26 DIAGNOSIS — Z79891 Long term (current) use of opiate analgesic: Secondary | ICD-10-CM

## 2018-04-26 DIAGNOSIS — E1122 Type 2 diabetes mellitus with diabetic chronic kidney disease: Secondary | ICD-10-CM | POA: Diagnosis not present

## 2018-04-26 MED ORDER — OXYCODONE-ACETAMINOPHEN 10-325 MG PO TABS: 1.0000 | ORAL_TABLET | Freq: Two times a day (BID) | ORAL | 0 refills | Status: DC | PRN

## 2018-04-26 MED FILL — OXYCODONE-ACETAMINOPHEN 10-: 10-325 | 30 days supply | Qty: 60 | Fill #0

## 2018-04-26 NOTE — Assessment & Plan Note (Addendum)
Patient still dealing with pain from his cord compression myelopathy.  Pain medicine helping him be more active takes the edge off the pain. Referral for PT auto cancelled due to them not being able to contact the patient.  Pt did not request any escalation of regimen today.    -pmp database appropriate, will prescribe one month supply of percocet 10-325 -follow up with pcp in 2 months for diabetes can discuss pain contract consider uds

## 2018-04-26 NOTE — Assessment & Plan Note (Signed)
Pt did not bring his meter today, he did bring some dialysis labs including an a1c which 7.3 a few days ago which is down from his A1C one month ago that was 8.1.  He credits this to dietary change.  He is still using the same sliding scale for his novolog.  -continue sliding scale novolog -repeat A1C in two months, pt instructed to bring meter with values as A1C falsely lower with dialysis

## 2018-04-26 NOTE — Patient Instructions (Addendum)
[name], you are doing well.  I will resubmit that referral for physical therapy.  If you don't hear from them in one week please call us.  I will leave your sliding scale as is continue to work on your diet.  I will refill another one month supply of your pain meds, I am glad they're helping you get around.  We will have you return in two months for repeat A1C please bring your meter during that visit.
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
## 2018-04-27 ENCOUNTER — Other Ambulatory Visit: Payer: Self-pay | Admitting: Internal Medicine

## 2018-04-27 MED FILL — NOVOLOG FLEXPEN SYRINGE: 100 | 25 days supply | Qty: 15 | Fill #1

## 2018-04-27 NOTE — Telephone Encounter (Signed)
Called pt -informed to stop taking Glimepiride,while on dialysis per Dr Daryll Drown. Stated he did not this and thanks for calling.

## 2018-04-27 NOTE — Telephone Encounter (Signed)
Not on current med list; ? Discontinued or changed on 6/19 office visit.

## 2018-04-27 NOTE — Telephone Encounter (Signed)
Yes.  He is not supposed to be taking this while on dialysis.  Will refuse.  Can you please call the patient and ensure he is not taking?  He can bring in his bottles for [place] to dispose of.  Thanks

## 2018-04-27 NOTE — Progress Notes (Signed)
Internal Medicine Clinic Attending  Case discussed with Dr. Winfrey  at the time of the visit.  We reviewed the resident's history and exam and pertinent patient test results.  I agree with the assessment, diagnosis, and plan of care documented in the resident's note.  

## 2018-05-01 ENCOUNTER — Encounter: Payer: Self-pay | Admitting: *Deleted

## 2018-05-03 ENCOUNTER — Telehealth: Payer: Self-pay | Admitting: Internal Medicine

## 2018-05-03 DIAGNOSIS — Z992 Dependence on renal dialysis: Secondary | ICD-10-CM | POA: Diagnosis not present

## 2018-05-03 DIAGNOSIS — G8929 Other chronic pain: Secondary | ICD-10-CM | POA: Diagnosis not present

## 2018-05-03 DIAGNOSIS — I120 Hypertensive chronic kidney disease with stage 5 chronic kidney disease or end stage renal disease: Secondary | ICD-10-CM | POA: Diagnosis not present

## 2018-05-03 DIAGNOSIS — N186 End stage renal disease: Secondary | ICD-10-CM | POA: Diagnosis not present

## 2018-05-03 DIAGNOSIS — G4730 Sleep apnea, unspecified: Secondary | ICD-10-CM | POA: Diagnosis not present

## 2018-05-03 DIAGNOSIS — G9589 Other specified diseases of spinal cord: Secondary | ICD-10-CM | POA: Diagnosis not present

## 2018-05-03 DIAGNOSIS — Z794 Long term (current) use of insulin: Secondary | ICD-10-CM | POA: Diagnosis not present

## 2018-05-03 DIAGNOSIS — E1122 Type 2 diabetes mellitus with diabetic chronic kidney disease: Secondary | ICD-10-CM | POA: Diagnosis not present

## 2018-05-03 NOTE — Telephone Encounter (Signed)
Returned call to [employer], [place] with [facility]. Verbal auth given for [facility] PT twice weekly for 6 weeks to work on strengthening, balance and activity tolerance. Will route to PCP for agreement/denial. Hubbard Hartshorn, RN, BSN

## 2018-05-03 NOTE — Telephone Encounter (Signed)
Rip Harbour from [facility] homecare [phone], needs verbal orders; home pt for twice a week for 6 week

## 2018-05-08 DIAGNOSIS — G8929 Other chronic pain: Secondary | ICD-10-CM | POA: Diagnosis not present

## 2018-05-08 DIAGNOSIS — G9589 Other specified diseases of spinal cord: Secondary | ICD-10-CM | POA: Diagnosis not present

## 2018-05-08 DIAGNOSIS — G4730 Sleep apnea, unspecified: Secondary | ICD-10-CM | POA: Diagnosis not present

## 2018-05-08 DIAGNOSIS — E1122 Type 2 diabetes mellitus with diabetic chronic kidney disease: Secondary | ICD-10-CM | POA: Diagnosis not present

## 2018-05-08 DIAGNOSIS — I120 Hypertensive chronic kidney disease with stage 5 chronic kidney disease or end stage renal disease: Secondary | ICD-10-CM | POA: Diagnosis not present

## 2018-05-08 DIAGNOSIS — N186 End stage renal disease: Secondary | ICD-10-CM | POA: Diagnosis not present

## 2018-05-09 NOTE — Telephone Encounter (Signed)
I agree with authorization PT for strengthening. Is there something specific that I need to do to complete this authorization?

## 2018-05-10 NOTE — Telephone Encounter (Signed)
No, this is all we need, there will be written orders sent over now and more than likely the attending will need to sign

## 2018-05-10 NOTE — Telephone Encounter (Signed)
Thanks

## 2018-05-11 ENCOUNTER — Ambulatory Visit (INDEPENDENT_AMBULATORY_CARE_PROVIDER_SITE_OTHER): Payer: Medicare Other | Admitting: Vascular Surgery

## 2018-05-11 ENCOUNTER — Other Ambulatory Visit: Payer: Self-pay

## 2018-05-11 ENCOUNTER — Encounter: Payer: Self-pay | Admitting: Vascular Surgery

## 2018-05-11 VITALS — BP 114/75 | HR 91 | Temp 97.1°F | Resp 20 | Ht 68.0 in | Wt 312.0 lb

## 2018-05-11 DIAGNOSIS — N186 End stage renal disease: Secondary | ICD-10-CM | POA: Diagnosis not present

## 2018-05-11 DIAGNOSIS — Z992 Dependence on renal dialysis: Secondary | ICD-10-CM
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
## 2018-05-15 ENCOUNTER — Other Ambulatory Visit: Payer: Self-pay | Admitting: *Deleted

## 2018-05-22 ENCOUNTER — Other Ambulatory Visit: Payer: Self-pay | Admitting: Internal Medicine

## 2018-05-22 DIAGNOSIS — N186 End stage renal disease: Secondary | ICD-10-CM | POA: Diagnosis not present

## 2018-05-22 DIAGNOSIS — G8929 Other chronic pain: Secondary | ICD-10-CM | POA: Diagnosis not present

## 2018-05-22 DIAGNOSIS — E1122 Type 2 diabetes mellitus with diabetic chronic kidney disease: Secondary | ICD-10-CM | POA: Diagnosis not present

## 2018-05-22 DIAGNOSIS — I120 Hypertensive chronic kidney disease with stage 5 chronic kidney disease or end stage renal disease: Secondary | ICD-10-CM | POA: Diagnosis not present

## 2018-05-22 DIAGNOSIS — G9589 Other specified diseases of spinal cord: Secondary | ICD-10-CM | POA: Diagnosis not present

## 2018-05-22 DIAGNOSIS — G4730 Sleep apnea, unspecified: Secondary | ICD-10-CM | POA: Diagnosis not present

## 2018-05-22 MED FILL — hydrALAZINE HCL 25 MG TABS: 25 | 30 days supply | Qty: 60 | Fill #1

## 2018-05-24 ENCOUNTER — Telehealth: Payer: Self-pay

## 2018-05-24 ENCOUNTER — Other Ambulatory Visit: Payer: Self-pay | Admitting: Internal Medicine

## 2018-05-24 DIAGNOSIS — G9520 Unspecified cord compression: Secondary | ICD-10-CM

## 2018-05-24 MED ORDER — OXYCODONE-ACETAMINOPHEN 10-325 MG PO TABS: 1.0000 | ORAL_TABLET | Freq: Two times a day (BID) | ORAL | 0 refills | Status: DC | PRN

## 2018-05-24 NOTE — Telephone Encounter (Signed)
oxyCODONE-acetaminophen (PERCOCET) 10-325 MG tablet, refill request @  [facility], [place] AT [address] [phone] (Phone) [phone] (Fax)

## 2018-05-25 ENCOUNTER — Telehealth: Payer: Self-pay

## 2018-05-25 NOTE — Telephone Encounter (Signed)
Rtc, should have gone to [employer] records, janet states "the PA signed these orders and a PA cant sign, his name is david swaim, informed her there is not a david swaim associated w/ [facility] clinic, checked fax# and she states she sent the paperwork to [phone], she states she will refax it

## 2018-05-25 NOTE — Telephone Encounter (Signed)
Marcie Bal with [facility] requesting to speak with a nurse about getting an attending name on plan of care ordered. Please call back.

## 2018-05-29 ENCOUNTER — Encounter (HOSPITAL_COMMUNITY): Payer: Self-pay | Admitting: *Deleted

## 2018-05-29 ENCOUNTER — Other Ambulatory Visit: Payer: Self-pay

## 2018-05-29 NOTE — Telephone Encounter (Signed)
Dr. Alfonse Spruce is scheduled to be in [facility] for the month of September.  Patient is scheduled in [facility] on 06/19/18 at 9:45 am.  Appointment letter will be mailed.

## 2018-05-29 NOTE — Progress Notes (Signed)
Spoke with pt for pre-op call. Pt denies cardiac history. Pt is a type 2 diabetic. Last A1C was 8.1 on 03/28/18. States his fasting blood sugar is usually between 120-150. Instructed pt to check his blood sugar when he gets up Thursday AM and every 2 hours until he leaves for the hospital. If blood sugar is >220 take 1/2 of usual correction dose of Novolog insulin. If blood sugar is 70 or below, treat with 1/2 cup of clear juice (apple or cranberry) and recheck blood sugar 15 minutes after drinking juice. If blood sugar continues to be 70 or below, call the Short Stay department and ask to speak to a nurse. Pt voiced understanding.

## 2018-05-30 MED ORDER — DEXTROSE 5 % IV SOLN
3.0000 g | INTRAVENOUS | Status: AC
  Administered 2018-05-31: 3 g via INTRAVENOUS
  Filled 2018-05-30: qty 3

## 2018-05-31 ENCOUNTER — Ambulatory Visit (HOSPITAL_COMMUNITY): Payer: Medicare Other | Admitting: Certified Registered Nurse Anesthetist

## 2018-05-31 ENCOUNTER — Ambulatory Visit (HOSPITAL_COMMUNITY)
Admission: RE | Admit: 2018-05-31 | Discharge: 2018-05-31 | Disposition: A | Discharge status: Home / Self Care | Payer: Medicare Other | Source: Ambulatory Visit | Attending: Vascular Surgery | Admitting: Vascular Surgery

## 2018-05-31 ENCOUNTER — Telehealth: Payer: Self-pay | Admitting: Vascular Surgery

## 2018-05-31 ENCOUNTER — Encounter (HOSPITAL_COMMUNITY): Payer: Self-pay | Admitting: Certified Registered Nurse Anesthetist

## 2018-05-31 ENCOUNTER — Encounter (HOSPITAL_COMMUNITY)
Admission: RE | Disposition: A | Discharge status: Home / Self Care | Payer: Self-pay | Source: Ambulatory Visit | Attending: Vascular Surgery

## 2018-05-31 DIAGNOSIS — Z79899 Other long term (current) drug therapy: Secondary | ICD-10-CM | POA: Insufficient documentation

## 2018-05-31 DIAGNOSIS — Z7982 Long term (current) use of aspirin: Secondary | ICD-10-CM | POA: Diagnosis not present

## 2018-05-31 DIAGNOSIS — I120 Hypertensive chronic kidney disease with stage 5 chronic kidney disease or end stage renal disease: Secondary | ICD-10-CM | POA: Insufficient documentation

## 2018-05-31 DIAGNOSIS — T82898A Other specified complication of vascular prosthetic devices, implants and grafts, initial encounter: Secondary | ICD-10-CM | POA: Insufficient documentation

## 2018-05-31 DIAGNOSIS — Y828 Other medical devices associated with adverse incidents: Secondary | ICD-10-CM | POA: Insufficient documentation

## 2018-05-31 DIAGNOSIS — E669 Obesity, unspecified: Secondary | ICD-10-CM | POA: Insufficient documentation

## 2018-05-31 DIAGNOSIS — Z6841 Body Mass Index (BMI) 40.0 and over, adult: Secondary | ICD-10-CM | POA: Insufficient documentation

## 2018-05-31 DIAGNOSIS — Z794 Long term (current) use of insulin: Secondary | ICD-10-CM | POA: Diagnosis not present

## 2018-05-31 DIAGNOSIS — Z992 Dependence on renal dialysis: Secondary | ICD-10-CM | POA: Insufficient documentation

## 2018-05-31 DIAGNOSIS — G9520 Unspecified cord compression: Secondary | ICD-10-CM

## 2018-05-31 DIAGNOSIS — E1122 Type 2 diabetes mellitus with diabetic chronic kidney disease: Secondary | ICD-10-CM | POA: Insufficient documentation

## 2018-05-31 DIAGNOSIS — Z87891 Personal history of nicotine dependence: Secondary | ICD-10-CM | POA: Insufficient documentation

## 2018-05-31 DIAGNOSIS — N186 End stage renal disease: Secondary | ICD-10-CM | POA: Insufficient documentation

## 2018-05-31 HISTORY — PX: REVISON OF ARTERIOVENOUS FISTULA: SHX6074

## 2018-05-31 LAB — POCT I-STAT 4, (NA,K, GLUC, HGB,HCT)
Glucose, Bld: 208 mg/dL — ABNORMAL HIGH (ref 70–99)
HEMATOCRIT: 40 % (ref 39.0–52.0)
Hemoglobin: 13.6 g/dL (ref 13.0–17.0)
Potassium: 5.1 mmol/L (ref 3.5–5.1)
Sodium: 134 mmol/L — ABNORMAL LOW (ref 135–145)

## 2018-05-31 LAB — GLUCOSE, CAPILLARY
Glucose-Capillary: 232 mg/dL — ABNORMAL HIGH (ref 70–99)
Glucose-Capillary: 147 mg/dL — ABNORMAL HIGH (ref 70–99)

## 2018-05-31 SURGERY — REVISON OF ARTERIOVENOUS FISTULA
Anesthesia: Choice | Site: Arm Lower | Laterality: Right

## 2018-05-31 MED ORDER — OXYCODONE HCL 5 MG PO TABS
ORAL_TABLET | ORAL | Status: AC
  Filled 2018-05-31: qty 1

## 2018-05-31 MED ORDER — SODIUM CHLORIDE 0.9 % IV SOLN
INTRAVENOUS | Status: DC | PRN
  Administered 2018-05-31: 25 ug/min via INTRAVENOUS

## 2018-05-31 MED ORDER — 0.9 % SODIUM CHLORIDE (POUR BTL) OPTIME
TOPICAL | Status: DC | PRN
  Administered 2018-05-31: 1000 mL

## 2018-05-31 MED ORDER — NEOSTIGMINE METHYLSULFATE 5 MG/5ML IV SOSY
PREFILLED_SYRINGE | INTRAVENOUS | Status: DC | PRN
  Administered 2018-05-31: 5 mg via INTRAVENOUS

## 2018-05-31 MED ORDER — PHENYLEPHRINE 40 MCG/ML (10ML) SYRINGE FOR IV PUSH (FOR BLOOD PRESSURE SUPPORT)
PREFILLED_SYRINGE | INTRAVENOUS | Status: DC | PRN
  Administered 2018-05-31 (×3): 120 ug via INTRAVENOUS

## 2018-05-31 MED ORDER — LIDOCAINE 2% (20 MG/ML) 5 ML SYRINGE
INTRAMUSCULAR | Status: DC | PRN
  Administered 2018-05-31: 60 mg via INTRAVENOUS

## 2018-05-31 MED ORDER — ONDANSETRON HCL 4 MG/2ML IJ SOLN
INTRAMUSCULAR | Status: DC | PRN
  Administered 2018-05-31: 4 mg via INTRAVENOUS

## 2018-05-31 MED ORDER — OXYCODONE HCL 5 MG/5ML PO SOLN: 5.0000 mg | Freq: Once | ORAL | Status: AC | PRN

## 2018-05-31 MED ORDER — PROTAMINE SULFATE 10 MG/ML IV SOLN
INTRAVENOUS | Status: DC | PRN
  Administered 2018-05-31: 50 mg via INTRAVENOUS

## 2018-05-31 MED ORDER — SODIUM CHLORIDE 0.9 % IV SOLN
INTRAVENOUS | Status: DC
  Administered 2018-05-31: 08:00:00 via INTRAVENOUS

## 2018-05-31 MED ORDER — GLYCOPYRROLATE PF 0.2 MG/ML IJ SOSY
PREFILLED_SYRINGE | INTRAMUSCULAR | Status: DC | PRN
  Administered 2018-05-31: .6 mg via INTRAVENOUS

## 2018-05-31 MED ORDER — ROCURONIUM BROMIDE 50 MG/5ML IV SOSY
PREFILLED_SYRINGE | INTRAVENOUS | Status: AC
  Filled 2018-05-31: qty 5

## 2018-05-31 MED ORDER — PROTAMINE SULFATE 10 MG/ML IV SOLN
INTRAVENOUS | Status: AC
  Filled 2018-05-31: qty 5

## 2018-05-31 MED ORDER — SODIUM CHLORIDE 0.9 % IV SOLN
INTRAVENOUS | Status: AC
  Filled 2018-05-31: qty 1.2

## 2018-05-31 MED ORDER — LIDOCAINE 2% (20 MG/ML) 5 ML SYRINGE
INTRAMUSCULAR | Status: AC
  Filled 2018-05-31: qty 5

## 2018-05-31 MED ORDER — FENTANYL CITRATE (PF) 100 MCG/2ML IJ SOLN: 25.0000 ug | INTRAMUSCULAR | Status: DC | PRN

## 2018-05-31 MED ORDER — EPHEDRINE 5 MG/ML INJ
INTRAVENOUS | Status: AC
  Filled 2018-05-31: qty 10

## 2018-05-31 MED ORDER — EPHEDRINE SULFATE-NACL 50-0.9 MG/10ML-% IV SOSY
PREFILLED_SYRINGE | INTRAVENOUS | Status: DC | PRN
  Administered 2018-05-31 (×2): 5 mg via INTRAVENOUS

## 2018-05-31 MED ORDER — FENTANYL CITRATE (PF) 250 MCG/5ML IJ SOLN
INTRAMUSCULAR | Status: AC
  Filled 2018-05-31: qty 5

## 2018-05-31 MED ORDER — OXYCODONE-ACETAMINOPHEN 10-325 MG PO TABS: 1.0000 | ORAL_TABLET | Freq: Four times a day (QID) | ORAL | 0 refills | Status: DC | PRN

## 2018-05-31 MED ORDER — SUCCINYLCHOLINE CHLORIDE 200 MG/10ML IV SOSY
PREFILLED_SYRINGE | INTRAVENOUS | Status: DC | PRN
  Administered 2018-05-31: 140 mg via INTRAVENOUS

## 2018-05-31 MED ORDER — PROPOFOL 10 MG/ML IV BOLUS
INTRAVENOUS | Status: DC | PRN
  Administered 2018-05-31: 170 mg via INTRAVENOUS
  Administered 2018-05-31: 30 mg via INTRAVENOUS

## 2018-05-31 MED ORDER — SODIUM CHLORIDE 0.9 % IV SOLN
INTRAVENOUS | Status: DC | PRN
  Administered 2018-05-31: 09:00:00

## 2018-05-31 MED ORDER — ROCURONIUM BROMIDE 10 MG/ML (PF) SYRINGE
PREFILLED_SYRINGE | INTRAVENOUS | Status: DC | PRN
  Administered 2018-05-31: 30 mg via INTRAVENOUS

## 2018-05-31 MED ORDER — MIDAZOLAM HCL 5 MG/5ML IJ SOLN
INTRAMUSCULAR | Status: DC | PRN
  Administered 2018-05-31: 1 mg via INTRAVENOUS

## 2018-05-31 MED ORDER — GLYCOPYRROLATE PF 0.2 MG/ML IJ SOSY
PREFILLED_SYRINGE | INTRAMUSCULAR | Status: AC
  Filled 2018-05-31: qty 2

## 2018-05-31 MED ORDER — OXYCODONE HCL 5 MG PO TABS
5.0000 mg | ORAL_TABLET | Freq: Once | ORAL | Status: AC | PRN
  Administered 2018-05-31: 5 mg via ORAL

## 2018-05-31 MED ORDER — ONDANSETRON HCL 4 MG/2ML IJ SOLN: 4.0000 mg | Freq: Once | INTRAMUSCULAR | Status: DC | PRN

## 2018-05-31 MED ORDER — HEPARIN SODIUM (PORCINE) 1000 UNIT/ML IJ SOLN
INTRAMUSCULAR | Status: DC | PRN
  Administered 2018-05-31: 10000 [IU] via INTRAVENOUS

## 2018-05-31 MED ORDER — MIDAZOLAM HCL 2 MG/2ML IJ SOLN
INTRAMUSCULAR | Status: AC
  Filled 2018-05-31: qty 2

## 2018-05-31 MED ORDER — ONDANSETRON HCL 4 MG/2ML IJ SOLN
INTRAMUSCULAR | Status: AC
  Filled 2018-05-31: qty 2

## 2018-05-31 MED ORDER — HEPARIN SODIUM (PORCINE) 1000 UNIT/ML IJ SOLN
INTRAMUSCULAR | Status: AC
  Filled 2018-05-31: qty 1

## 2018-05-31 MED ORDER — PHENYLEPHRINE 40 MCG/ML (10ML) SYRINGE FOR IV PUSH (FOR BLOOD PRESSURE SUPPORT)
PREFILLED_SYRINGE | INTRAVENOUS | Status: AC
  Filled 2018-05-31: qty 10

## 2018-05-31 MED ORDER — SUGAMMADEX SODIUM 200 MG/2ML IV SOLN
INTRAVENOUS | Status: DC | PRN
  Administered 2018-05-31: 300 mg via INTRAVENOUS

## 2018-05-31 MED ORDER — FENTANYL CITRATE (PF) 250 MCG/5ML IJ SOLN
INTRAMUSCULAR | Status: DC | PRN
  Administered 2018-05-31 (×3): 50 ug via INTRAVENOUS

## 2018-05-31 SURGICAL SUPPLY — 43 items
ARMBAND PINK RESTRICT EXTREMIT (MISCELLANEOUS) ×2 IMPLANT
CANISTER SUCT 3000ML PPV (MISCELLANEOUS) ×2 IMPLANT
CATH EMB 4FR 40CM (CATHETERS) ×2 IMPLANT
CLIP VESOCCLUDE MED 6/CT (CLIP) ×2 IMPLANT
CLIP VESOCCLUDE SM WIDE 6/CT (CLIP) ×2 IMPLANT
COVER PROBE W GEL 5X96 (DRAPES) ×2 IMPLANT
DERMABOND ADHESIVE PROPEN (GAUZE/BANDAGES/DRESSINGS) ×1
DERMABOND ADVANCED (GAUZE/BANDAGES/DRESSINGS) ×1
DERMABOND ADVANCED .7 DNX12 (GAUZE/BANDAGES/DRESSINGS) ×1 IMPLANT
DERMABOND ADVANCED .7 DNX6 (GAUZE/BANDAGES/DRESSINGS) ×1 IMPLANT
DRAPE ORTHO SPLIT 77X108 STRL (DRAPES) ×1
DRAPE SURG ORHT 6 SPLT 77X108 (DRAPES) ×1 IMPLANT
ELECT CAUTERY BLADE 6.4 (BLADE) ×2 IMPLANT
ELECT REM PT RETURN 9FT ADLT (ELECTROSURGICAL) ×2
ELECTRODE REM PT RTRN 9FT ADLT (ELECTROSURGICAL) ×1 IMPLANT
GLOVE BIO SURGEON STRL SZ7.5 (GLOVE) ×2 IMPLANT
GLOVE BIOGEL PI IND STRL 7.5 (GLOVE) ×1 IMPLANT
GLOVE BIOGEL PI INDICATOR 7.5 (GLOVE) ×1
GLOVE ECLIPSE 7.0 STRL STRAW (GLOVE) ×2 IMPLANT
GOWN STRL REUS W/ TWL LRG LVL3 (GOWN DISPOSABLE) ×2 IMPLANT
GOWN STRL REUS W/ TWL XL LVL3 (GOWN DISPOSABLE) ×1 IMPLANT
GOWN STRL REUS W/TWL LRG LVL3 (GOWN DISPOSABLE) ×2
GOWN STRL REUS W/TWL XL LVL3 (GOWN DISPOSABLE) ×1
GRAFT COLLAGEN VASCULAR 6X15 (Vascular Products) ×1 IMPLANT
GRAFT COLLAGEN VASCULAR 6X19 (Vascular Products) ×1 IMPLANT
KIT BASIN OR (CUSTOM PROCEDURE TRAY) ×2 IMPLANT
KIT TURNOVER KIT B (KITS) ×2 IMPLANT
NS IRRIG 1000ML POUR BTL (IV SOLUTION) ×4 IMPLANT
PACK CV ACCESS (CUSTOM PROCEDURE TRAY) ×2 IMPLANT
PAD ARMBOARD 7.5X6 YLW CONV (MISCELLANEOUS) ×4 IMPLANT
SUT MNCRL AB 4-0 PS2 18 (SUTURE) ×2 IMPLANT
SUT PROLENE 5 0 C 1 24 (SUTURE) ×4 IMPLANT
SUT PROLENE 6 0 BV (SUTURE) ×2 IMPLANT
SUT SILK 2 0 PERMA HAND 18 BK (SUTURE) ×2 IMPLANT
SUT VIC AB 2-0 CT1 27 (SUTURE) ×1
SUT VIC AB 2-0 CT1 TAPERPNT 27 (SUTURE) ×1 IMPLANT
SUT VIC AB 3-0 SH 27 (SUTURE) ×1
SUT VIC AB 3-0 SH 27X BRD (SUTURE) ×1 IMPLANT
SYR 3ML LL SCALE MARK (SYRINGE) ×2 IMPLANT
SYR TOOMEY 50ML (SYRINGE) ×2 IMPLANT
TOWEL GREEN STERILE (TOWEL DISPOSABLE) ×2 IMPLANT
UNDERPAD 30X30 (UNDERPADS AND DIAPERS) ×2 IMPLANT
WATER STERILE IRR 1000ML POUR (IV SOLUTION) ×2 IMPLANT

## 2018-05-31 NOTE — Transfer of Care (Signed)
Immediate Anesthesia Transfer of Care Note  Patient: Seth [name]  Procedure(s) Performed: REVISION OF ARTERIOVENOUS FISTULA  RIGHT ARM WITH INTERPOSITION ARTEGRAFT 6MM X 16CM GRAFT (Right Arm Lower)  Patient Location: PACU  Anesthesia Type:General  Level of Consciousness: awake, alert , oriented and patient cooperative  Airway & Oxygen Therapy: Patient Spontanous Breathing and Patient connected to face mask oxygen  Post-op Assessment: Report given to RN, Post -op Vital signs reviewed and stable and Patient moving all extremities X 4  Post vital signs: Reviewed and stable  Last Vitals:  Vitals Value Taken Time  BP 119/56 05/31/2018 10:52 AM  Temp    Pulse 89 05/31/2018 10:53 AM  Resp 21 05/31/2018 10:53 AM  SpO2 97 % 05/31/2018 10:53 AM  Vitals shown include unvalidated device data.  Last Pain:  Vitals:   05/31/18 0707  TempSrc: Oral      Patients Stated Pain Goal: 0 (37/94/44 6190)  Complications: No apparent anesthesia complications

## 2018-05-31 NOTE — Anesthesia Postprocedure Evaluation (Signed)
Anesthesia Post Note  Patient: Seth [name]  Procedure(s) Performed: REVISION OF ARTERIOVENOUS FISTULA  RIGHT ARM WITH INTERPOSITION ARTEGRAFT 6MM X 16CM GRAFT (Right Arm Lower)     Patient location during evaluation: PACU Anesthesia Type: General Level of consciousness: awake and alert Pain management: pain level controlled Vital Signs Assessment: post-procedure vital signs reviewed and stable Respiratory status: spontaneous breathing, nonlabored ventilation, respiratory function stable and patient connected to nasal cannula oxygen Cardiovascular status: blood pressure returned to baseline and stable Postop Assessment: no apparent nausea or vomiting Anesthetic complications: no    Last Vitals:  Vitals:   05/31/18 1345 05/31/18 1415  BP: 109/64 120/72  Pulse: 76 78  Resp: 18 (!) 22  Temp:  (!) 36.4 C  SpO2: (!) 89% 92%    Last Pain:  Vitals:   05/31/18 0707  TempSrc: Oral                 Mardell Cragg COKER

## 2018-05-31 NOTE — Anesthesia Procedure Notes (Signed)
Procedure Name: Intubation Date/Time: 05/31/2018 9:00 AM Performed by: Colin Benton, CRNA Pre-anesthesia Checklist: Patient identified, Emergency Drugs available, Suction available and Patient being monitored Patient Re-evaluated:Patient Re-evaluated prior to induction Oxygen Delivery Method: Circle system utilized Preoxygenation: Pre-oxygenation with 100% oxygen Induction Type: Rapid sequence and IV induction Laryngoscope Size: Glidescope and 4 Grade View: Grade I Tube type: Oral Tube size: 7.5 mm Number of attempts: 1 Airway Equipment and Method: Stylet Placement Confirmation: ETT inserted through vocal cords under direct vision,  positive ETCO2 and breath sounds checked- equal and bilateral Secured at: 23 cm Tube secured with: Tape Dental Injury: Teeth and Oropharynx as per pre-operative assessment  Difficulty Due To: Difficulty was anticipated, Difficult Airway- due to large tongue and Difficult Airway- due to limited oral opening Future Recommendations: Recommend- induction with short-acting agent, and alternative techniques readily available

## 2018-05-31 NOTE — Op Note (Signed)
Patient name: Seth [name] MRN: [id number] DOB: [birth year] Sex: male  05/31/2018 Pre-operative Diagnosis: End-stage renal disease, malfunction right arm AV fistula Post-operative diagnosis:  Same Surgeon:  Eda Paschal. Donzetta Matters, MD Assistant: Laurence Slate, PA Procedure Performed: Revision of right arm AV fistula with 6 mm interposition Artegraft and partial excision of AV fistula and associated skin  Indications: 52 year old male with history end-stage renal disease was previously on dialysis via a right radiocephalic fistula.  He has some large pseudoaneurysmal degeneration that is very weak pulsatility across it.  He was unable to have a fistulogram performed secondary to thrombus in the pseudoaneurysm and has been unable to complete dialysis and is now requiring catheter.  He is indicated for revision of his fistula.  Findings: The pseudoaneurysmal degeneration aspects were nearly completely thrombosed and very friable.  Given the degeneration of skin around this we elected to tunnel Artegraft laterally.  I completion there was still pulsatility throughout the fistula blood pressure was very low.   It is possible that this fistula cannot be used up for couple weeks he will need a fistulogram to evaluate the entirety of the fistula since this could not be performed preprocedure.    Procedure:  The patient was identified in the holding area and taken to the operating room where general anesthesia was induced he was sterilely prepped draped the right upper extremity usual fashion given antibiotics and timeout was called.  I used ultrasound to evaluate the fistula which demonstrated the areas of pseudoaneurysmal degeneration with significant thrombus throughout.  I then made a longitudinal incision overlying these 2 areas was dissecting out proximal control when we had some breakdown of the fistula itself and bleeding.  Pressure was held I quickly dissected out approximately was able to get a clamp on  the fistula.  Patient was given 10,000 units of heparin at this time.  We then opened the fistula longitudinally and were able to get a clamp distally.  I then excised all of this thrombus and then the fistula itself given how friable it was at the level of the pseudoaneurysm.  In total I excised approximately 8 cm in length.  With this he was going to need interposition grafting I elected to tunnel laterally.  Interposition graft with tunneled marked for orientation.  I opened the inflow artery and did have we can flow passed a 4 Fogarty proximally and did improve although was not terribly strong.  I flushed with heparinized saline and reclamped.  I did then flushed with heparinized saline and the venous outflow component and reclamped.  I sewed the venous outflow first and 2 and with 5-0 Prolene and then turned my attention to the arterial end.  The Artegraft was trimmed to size and sewn end-to-end.  This time prior to completing anastomosis I allowed forward and backbleeding.  Upon completion the fistula had a palpable pulse.  It did have continuous flow throughout diastole with Doppler.  I can use ultrasound did not identify any areas to evaluate.  We will see if this fistula can work in the near future when he can heal the wound.  We irrigated and gave 50 mg of protamine obtained hemostasis and closed in 2 layers of Vicryl Monocryl.  Dermabond was placed to level the skin.  He was allowed away from anesthesia having tolerated procedure without immediate complication.  Next  EBL 250 cc.    Alizae Bechtel C. Donzetta Matters, MD [facility] [place] Office: [phone] Pager: [phone]

## 2018-05-31 NOTE — Telephone Encounter (Signed)
sch appt lvm 4pm p/o PA

## 2018-05-31 NOTE — Progress Notes (Signed)
2 attempts at IV, no success.

## 2018-05-31 NOTE — Discharge Instructions (Signed)
° °  [facility] [place] ° °Discharge Instructions ° °AV Fistula or Graft Surgery for Dialysis Access ° °Please refer to the following instructions for your post-procedure care. Your surgeon or physician assistant will discuss any changes with you. ° °Activity ° °You may drive the day following your surgery, if you are comfortable and no longer taking prescription pain medication. Resume full activity as the soreness in your incision resolves. ° °Bathing/Showering ° °You may shower after you go home. Keep your incision dry for 48 hours. Do not soak in a bathtub, hot tub, or swim until the incision heals completely. You may not shower if you have a hemodialysis catheter. ° °Incision Care ° °Clean your incision with mild soap and water after 48 hours. Pat the area dry with a clean towel. You do not need a bandage unless otherwise instructed. Do not apply any ointments or creams to your incision. You may have skin glue on your incision. Do not peel it off. It will come off on its own in about one week. Your arm may swell a bit after surgery. To reduce swelling use pillows to elevate your arm so it is above your heart. Your doctor will tell you if you need to lightly wrap your arm with an ACE bandage. ° °Diet ° °Resume your normal diet. There are not special food restrictions following this procedure. In order to heal from your surgery, it is CRITICAL to get adequate nutrition. Your body requires vitamins, minerals, and protein. Vegetables are the best source of vitamins and minerals. Vegetables also provide the perfect balance of protein. Processed food has little nutritional value, so try to avoid this. ° °Medications ° °Resume taking all of your medications. If your incision is causing pain, you may take over-the counter pain relievers such as acetaminophen (Tylenol). If you were prescribed a stronger pain medication, please be aware these medications can cause nausea and constipation. Prevent  nausea by taking the medication with a snack or meal. Avoid constipation by drinking plenty of fluids and eating foods with high amount of fiber, such as fruits, vegetables, and grains. Do not take Tylenol if you are taking prescription pain medications. ° ° ° ° °Follow up °Your surgeon may want to see you in the office following your access surgery. If so, this will be arranged at the time of your surgery. ° °Please call us immediately for any of the following conditions: ° °Increased pain, redness, drainage (pus) from your incision site °Fever of 101 degrees or higher °Severe or worsening pain at your incision site °Hand pain or numbness. ° °Reduce your risk of vascular disease: ° °Stop smoking. If you would like help, call [facility] at 1-800-QUIT-NOW (1-800-784-8669) or  at 336-586-4000 ° °Manage your cholesterol °Maintain a desired weight °Control your diabetes °Keep your blood pressure down ° °Dialysis ° °It will take several weeks to several months for your new dialysis access to be ready for use. Your surgeon will determine when it is OK to use it. Your nephrologist will continue to direct your dialysis. You can continue to use your Permcath until your new access is ready for use. ° °If you have any questions, please call the office at [phone]. ° °

## 2018-05-31 NOTE — H&P (Signed)
   History and Physical Update  The patient was interviewed and re-examined.  The patient's previous History and Physical has been reviewed and is unchanged from recent office visit. Plan for revision of right arm avf with possible interposition grafting.   Aliesha Dolata C. Donzetta Matters, MD [facility] [place] Office: [phone] Pager: [phone]  05/31/2018, 7:20 AM

## 2018-05-31 NOTE — Progress Notes (Signed)
Evet, PACU tech, transferred Seth [name] to the valet to meet his fiancee at the car.  Seth [name] refused to get in passenger side and proceeded to get in driver's seat, fiancee informed him he was not supposed to drive, he proceeded to get in driver's seat anyway, and then began driving them home.  CJ, PACU RN, fully informed Seth Glymph and [name] that he should under no circumstances drive today, that it is not legal and his fiancee was required to drive him home.

## 2018-05-31 NOTE — Anesthesia Preprocedure Evaluation (Signed)
Anesthesia Evaluation  Patient identified by MRN, date of birth, ID band Patient awake    Reviewed: Allergy & Precautions, NPO status , Patient's Chart, lab work & pertinent test results  Airway Mallampati: III   Neck ROM: Full    Dental   Pulmonary former smoker,     + decreased breath sounds      Cardiovascular hypertension,  Rhythm:Regular Rate:Normal     Neuro/Psych    GI/Hepatic   Endo/Other  diabetes  Renal/GU      Musculoskeletal   Abdominal (+) + obese,   Peds  Hematology   Anesthesia Other Findings   Reproductive/Obstetrics                             Anesthesia Physical Anesthesia Plan  ASA: III  Anesthesia Plan: General   Post-op Pain Management:    Induction: Intravenous  PONV Risk Score and Plan: Ondansetron and Dexamethasone  Airway Management Planned: Oral ETT  Additional Equipment:   Intra-op Plan:   Post-operative Plan: Extubation in OR  Informed Consent: I have reviewed the patients History and Physical, chart, labs and discussed the procedure including the risks, benefits and alternatives for the proposed anesthesia with the patient or authorized representative who has indicated his/her understanding and acceptance.     Plan Discussed with: CRNA and Anesthesiologist  Anesthesia Plan Comments:         Anesthesia Quick Evaluation

## 2018-06-01 ENCOUNTER — Encounter (HOSPITAL_COMMUNITY): Payer: Self-pay | Admitting: Vascular Surgery

## 2018-06-01 LAB — POCT I-STAT 4, (NA,K, GLUC, HGB,HCT)
GLUCOSE: 228 mg/dL — AB (ref 70–99)
HCT: 40 % (ref 39.0–52.0)
Hemoglobin: 13.6 g/dL (ref 13.0–17.0)
Sodium: 122 mmol/L — ABNORMAL LOW (ref 135–145)

## 2018-06-09 DIAGNOSIS — N186 End stage renal disease: Secondary | ICD-10-CM | POA: Diagnosis not present

## 2018-06-09 DIAGNOSIS — Z992 Dependence on renal dialysis: Secondary | ICD-10-CM | POA: Diagnosis not present

## 2018-06-14 DIAGNOSIS — G4730 Sleep apnea, unspecified: Secondary | ICD-10-CM | POA: Diagnosis not present

## 2018-06-14 DIAGNOSIS — I120 Hypertensive chronic kidney disease with stage 5 chronic kidney disease or end stage renal disease: Secondary | ICD-10-CM | POA: Diagnosis not present

## 2018-06-14 DIAGNOSIS — N186 End stage renal disease: Secondary | ICD-10-CM | POA: Diagnosis not present

## 2018-06-14 DIAGNOSIS — G8929 Other chronic pain: Secondary | ICD-10-CM | POA: Diagnosis not present

## 2018-06-14 DIAGNOSIS — G9589 Other specified diseases of spinal cord: Secondary | ICD-10-CM | POA: Diagnosis not present

## 2018-06-14 DIAGNOSIS — E1122 Type 2 diabetes mellitus with diabetic chronic kidney disease: Secondary | ICD-10-CM | POA: Diagnosis not present

## 2018-06-19 ENCOUNTER — Ambulatory Visit: Payer: Self-pay

## 2018-06-21 ENCOUNTER — Other Ambulatory Visit: Payer: Self-pay

## 2018-06-21 ENCOUNTER — Ambulatory Visit (INDEPENDENT_AMBULATORY_CARE_PROVIDER_SITE_OTHER): Payer: Medicare Other | Admitting: Internal Medicine

## 2018-06-21 DIAGNOSIS — Z981 Arthrodesis status: Secondary | ICD-10-CM

## 2018-06-21 DIAGNOSIS — M4644 Discitis, unspecified, thoracic region: Secondary | ICD-10-CM | POA: Diagnosis not present

## 2018-06-21 DIAGNOSIS — Z79891 Long term (current) use of opiate analgesic: Secondary | ICD-10-CM

## 2018-06-21 DIAGNOSIS — Z992 Dependence on renal dialysis: Secondary | ICD-10-CM | POA: Diagnosis not present

## 2018-06-21 DIAGNOSIS — I120 Hypertensive chronic kidney disease with stage 5 chronic kidney disease or end stage renal disease: Secondary | ICD-10-CM

## 2018-06-21 DIAGNOSIS — M4624 Osteomyelitis of vertebra, thoracic region: Secondary | ICD-10-CM | POA: Diagnosis not present

## 2018-06-21 DIAGNOSIS — G8929 Other chronic pain: Secondary | ICD-10-CM

## 2018-06-21 DIAGNOSIS — G9520 Unspecified cord compression: Secondary | ICD-10-CM | POA: Diagnosis not present

## 2018-06-21 DIAGNOSIS — N186 End stage renal disease: Secondary | ICD-10-CM

## 2018-06-21 DIAGNOSIS — G4733 Obstructive sleep apnea (adult) (pediatric): Secondary | ICD-10-CM

## 2018-06-21 DIAGNOSIS — E1122 Type 2 diabetes mellitus with diabetic chronic kidney disease: Secondary | ICD-10-CM | POA: Diagnosis not present

## 2018-06-21 DIAGNOSIS — Z6841 Body Mass Index (BMI) 40.0 and over, adult: Secondary | ICD-10-CM | POA: Diagnosis not present

## 2018-06-21 DIAGNOSIS — Z87828 Personal history of other (healed) physical injury and trauma: Secondary | ICD-10-CM

## 2018-06-21 DIAGNOSIS — M25561 Pain in right knee: Secondary | ICD-10-CM | POA: Diagnosis not present

## 2018-06-21 DIAGNOSIS — Z9989 Dependence on other enabling machines and devices: Secondary | ICD-10-CM | POA: Diagnosis not present

## 2018-06-21 MED ORDER — OXYCODONE-ACETAMINOPHEN 10-325 MG PO TABS: 1.0000 | ORAL_TABLET | Freq: Three times a day (TID) | ORAL | 0 refills | Status: DC | PRN
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
## 2018-06-21 NOTE — Assessment & Plan Note (Addendum)
Assessment: Mr. Masten back and knee pain does seems to be secondary to his cord compression myelopathy. I do however believe that his morbid obesity is contributing to his pain. His back and knee pain is improved with the current Oxycodone 10-325 mg BID prn #60/month. He does however seem to have breakthrough pain which I believe can be relieved with increasing the frequency of his oxycodone.  Plan: 1. Increased frequency of pain medicine to Oxycodone 10-325 mg q8h PRN pain 2. Patient established a pain contract today 3. Recommended increasing exercise to three times a week to help him lose weight. I will consider a consult to the nutritionist at his next routine clinic visit.  3. Follow-up in 2 months 4. Order a blood tox screen at next clinic visit.
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
## 2018-06-21 NOTE — Patient Instructions (Signed)
Please take your Oxycodone every 8 hours as needed for the pain.   Please follow-up in 2-3 months to go over your other medical conditions.
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
## 2018-06-27 ENCOUNTER — Ambulatory Visit (INDEPENDENT_AMBULATORY_CARE_PROVIDER_SITE_OTHER): Payer: Self-pay | Admitting: Physician Assistant

## 2018-06-27 VITALS — BP 109/78 | HR 85 | Temp 97.4°F | Resp 18

## 2018-06-27 DIAGNOSIS — N186 End stage renal disease: Secondary | ICD-10-CM

## 2018-06-27 DIAGNOSIS — Z992 Dependence on renal dialysis: Secondary | ICD-10-CM

## 2018-06-27 NOTE — Progress Notes (Addendum)
    Postoperative Access Visit   History of Present Illness   Seth [name] is a 52 y.o. year old male who presents for postoperative follow-up for  revision of right arm radiocephalic fistula with interposition Artegraft and partial excision of AV fistula and associated skin by Dr. Donzetta Matters on 05/31/2018.  The patient's wounds are  healed.  The patient denies steal symptoms.  The patient is able to complete their activities of daily living.  He is dialyzing from right IJ tunneled dialysis catheter on a Tuesday Thursday Saturday schedule without complication.  Physical Examination   Vitals:   06/27/18 1604  BP: 109/78  Pulse: 85  Resp: 18  Temp: (!) 97.4 F (36.3 C)  TempSrc: Oral  SpO2: 93%   right arm Incision is healed, hand grip is 5/5, sensation in digits is intact, pulsatility through fistula in right forearm    Medical Decision Making   Seth [name] is a 52 y.o. year old male who presents s/p revision of right arm radiocephalic fistula with interposition Artegraft and partial excision of AV fistula and associated skin by Dr. Donzetta Matters on 05/31/2018.   Patent revised right radiocephalic fistula however flow is only pulsatile by my exam  Patient may require fistulogram prior to allowing use during hemodialysis  This case will be discussed with Dr. Donzetta Matters  If fistulogram is required, patient will be notified and scheduled by phone  Seth Ligas PA-C [facility] Office: [phone]  ADDENDUM 06/29/18: This case was discussed with Dr. Donzetta Matters.  Plan will be to allow dialysis center to use revised r arm fistula.  If not functioning properly, we will then schedule patient for a fistulogram.

## 2018-07-02 ENCOUNTER — Telehealth: Payer: Self-pay | Admitting: *Deleted

## 2018-07-02 NOTE — Telephone Encounter (Signed)
I spoke to Mickel Baas at [facility] [phone], and told her Dr. Claretha Cooper instruction regarding cannulation of his Rt arm AVF. She said they would try to use it tomorrow and will call me back if any complication.

## 2018-07-02 NOTE — Telephone Encounter (Signed)
-----   Message from Dagoberto Ligas, PA-C sent at 06/29/2018  3:07 PM EDT -----  Can someone notify this patient that Dr. Donzetta Matters would like his dialysis center to try using his R arm fistula.  If this is unsuccessful, patient will then require a fistulogram. Thanks, [facility]

## 2018-07-09 DIAGNOSIS — N186 End stage renal disease: Secondary | ICD-10-CM | POA: Diagnosis not present

## 2018-07-09 DIAGNOSIS — Z992 Dependence on renal dialysis: Secondary | ICD-10-CM | POA: Diagnosis not present

## 2018-07-10 ENCOUNTER — Ambulatory Visit (INDEPENDENT_AMBULATORY_CARE_PROVIDER_SITE_OTHER): Payer: Medicare Other | Admitting: Internal Medicine

## 2018-07-10 ENCOUNTER — Other Ambulatory Visit: Payer: Self-pay

## 2018-07-10 ENCOUNTER — Encounter: Payer: Self-pay | Admitting: Internal Medicine

## 2018-07-10 ENCOUNTER — Encounter (INDEPENDENT_AMBULATORY_CARE_PROVIDER_SITE_OTHER): Payer: Self-pay

## 2018-07-10 DIAGNOSIS — L259 Unspecified contact dermatitis, unspecified cause: Secondary | ICD-10-CM | POA: Insufficient documentation

## 2018-07-10 DIAGNOSIS — L2489 Irritant contact dermatitis due to other agents: Secondary | ICD-10-CM | POA: Diagnosis not present

## 2018-07-10 DIAGNOSIS — Z794 Long term (current) use of insulin: Secondary | ICD-10-CM | POA: Diagnosis not present

## 2018-07-10 DIAGNOSIS — Z992 Dependence on renal dialysis: Secondary | ICD-10-CM

## 2018-07-10 MED ORDER — TRIAMCINOLONE ACETONIDE 0.1 % EX CREA: 1.0000 "application " | TOPICAL_CREAM | Freq: Two times a day (BID) | CUTANEOUS | 0 refills | Status: DC

## 2018-07-10 NOTE — Patient Instructions (Signed)
Mr. Memmott,  It was a pleasure to see you. I have sent a prescription for a steroid cream to your pharmacy. Please apply this to the area two times a day for the next two weeks. You may also use a hypoallergenic moisturizing cream like Eucerin, apply this after the steroid. Follow up with [place] at your earliest convenience for your diabetes follow up. If you have any questions or concerns, call our clinic at [phone] or after hours call [phone] and ask for the internal medicine resident on call. Thank you!  Dr. Philipp Ovens   Novolog Sliding Scale: CBG 70 - 120: 0 units CBG 121 - 150: 3 units CBG 151 - 200: 4 units CBG 201 - 250: 7 units CBG 251 - 300: 11 units CBG 301 - 350: 15 units CBG 351 - 400: 20 units
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
## 2018-07-10 NOTE — Assessment & Plan Note (Signed)
Patient is here for an itchy, dry rash surrounding his newly placed right tunneled dialysis catheter.  On exam he has a localized area that appears consistent with a contact dermatitis.  Reports having the area cleaned with an alcohol solution 3 times a week for his dialysis session.  Area does not appear to be infected, there is no drainage or increased warmth.  Educated patient on diagnosis of contact dermatitis.  Instructed to use a topical corticosteroid twice daily x2 weeks.  Instructed to avoid insertion site of catheter by leaving a 5 mm margin.  Also advised hypoallergenic moisturizing creams. --Triamcinolone 0.1% cream twice daily x2 weeks --Hypoallergenic moisturizing creams (Eucerin) --Instructed to ask dialysis center to use a different cleaning solution

## 2018-07-11 NOTE — Progress Notes (Signed)
Internal Medicine Clinic Attending  Case discussed with Dr. Guilloud at the time of the visit.  We reviewed the resident's history and exam and pertinent patient test results.  I agree with the assessment, diagnosis, and plan of care documented in the resident's note.  

## 2018-07-18 ENCOUNTER — Other Ambulatory Visit: Payer: Self-pay | Admitting: Internal Medicine

## 2018-07-18 DIAGNOSIS — G9520 Unspecified cord compression: Secondary | ICD-10-CM

## 2018-07-18 NOTE — Telephone Encounter (Signed)
Needs refill on oxyCODONE-acetaminophen (PERCOCET) 10-325 MG tablet at [place] [facility]   ;pt contact [phone]

## 2018-07-20 MED ORDER — OXYCODONE-ACETAMINOPHEN 10-325 MG PO TABS: 1.0000 | ORAL_TABLET | Freq: Three times a day (TID) | ORAL | 0 refills | Status: AC | PRN

## 2018-07-20 MED FILL — OXYCODONE-ACETAMINOPHEN 10-: 10-325 | 30 days supply | Qty: 90 | Fill #0

## 2018-07-20 NOTE — Telephone Encounter (Signed)
Pt called back to check if   oxyCODONE-acetaminophen (PERCOCET) 10-325 MG tablet  Is ready. Please call pt back.

## 2018-08-09 DIAGNOSIS — Z992 Dependence on renal dialysis: Secondary | ICD-10-CM | POA: Diagnosis not present

## 2018-08-09 DIAGNOSIS — N186 End stage renal disease: Secondary | ICD-10-CM | POA: Diagnosis not present

## 2018-08-20 MED FILL — OXYCODONE-ACETAMINOPHEN 10-: 10-325 | 30 days supply | Qty: 60 | Fill #0

## 2018-08-31 MED FILL — hydrALAZINE HCL 25 MG TABS: 25 | 30 days supply | Qty: 60 | Fill #2

## 2018-08-31 MED FILL — AMLODIPINE BESYLATE 10 MG T: 10 | 90 days supply | Qty: 90 | Fill #1

## 2018-08-31 MED FILL — ATORVASTATIN 40 MG TABLET: 40 | 90 days supply | Qty: 90 | Fill #1

## 2018-09-03 ENCOUNTER — Non-Acute Institutional Stay (HOSPITAL_COMMUNITY)
Admission: EM | Admit: 2018-09-03 | Discharge: 2018-09-03 | Disposition: A | Discharge status: Home / Self Care | Payer: Medicare Other | Attending: Emergency Medicine | Admitting: Emergency Medicine

## 2018-09-03 ENCOUNTER — Emergency Department (HOSPITAL_COMMUNITY): Payer: Medicare Other

## 2018-09-03 ENCOUNTER — Encounter (HOSPITAL_COMMUNITY): Payer: Self-pay | Admitting: Emergency Medicine

## 2018-09-03 ENCOUNTER — Other Ambulatory Visit: Payer: Self-pay

## 2018-09-03 DIAGNOSIS — G4733 Obstructive sleep apnea (adult) (pediatric): Secondary | ICD-10-CM | POA: Diagnosis not present

## 2018-09-03 DIAGNOSIS — E875 Hyperkalemia: Secondary | ICD-10-CM | POA: Diagnosis present

## 2018-09-03 DIAGNOSIS — N186 End stage renal disease: Secondary | ICD-10-CM | POA: Insufficient documentation

## 2018-09-03 DIAGNOSIS — R05 Cough: Secondary | ICD-10-CM | POA: Diagnosis not present

## 2018-09-03 DIAGNOSIS — Z992 Dependence on renal dialysis: Secondary | ICD-10-CM | POA: Diagnosis not present

## 2018-09-03 DIAGNOSIS — Z794 Long term (current) use of insulin: Secondary | ICD-10-CM | POA: Diagnosis not present

## 2018-09-03 DIAGNOSIS — E785 Hyperlipidemia, unspecified: Secondary | ICD-10-CM | POA: Insufficient documentation

## 2018-09-03 DIAGNOSIS — R42 Dizziness and giddiness: Secondary | ICD-10-CM | POA: Diagnosis not present

## 2018-09-03 DIAGNOSIS — Z7982 Long term (current) use of aspirin: Secondary | ICD-10-CM | POA: Insufficient documentation

## 2018-09-03 DIAGNOSIS — I499 Cardiac arrhythmia, unspecified: Secondary | ICD-10-CM | POA: Diagnosis not present

## 2018-09-03 DIAGNOSIS — Z79899 Other long term (current) drug therapy: Secondary | ICD-10-CM | POA: Insufficient documentation

## 2018-09-03 DIAGNOSIS — I120 Hypertensive chronic kidney disease with stage 5 chronic kidney disease or end stage renal disease: Secondary | ICD-10-CM | POA: Insufficient documentation

## 2018-09-03 DIAGNOSIS — E1122 Type 2 diabetes mellitus with diabetic chronic kidney disease: Secondary | ICD-10-CM | POA: Diagnosis not present

## 2018-09-03 DIAGNOSIS — I440 Atrioventricular block, first degree: Secondary | ICD-10-CM | POA: Diagnosis not present

## 2018-09-03 DIAGNOSIS — E1165 Type 2 diabetes mellitus with hyperglycemia: Secondary | ICD-10-CM | POA: Diagnosis not present

## 2018-09-03 DIAGNOSIS — R531 Weakness: Secondary | ICD-10-CM | POA: Insufficient documentation

## 2018-09-03 HISTORY — DX: Dependence on renal dialysis: Z99.2

## 2018-09-03 LAB — CBC WITH DIFFERENTIAL/PLATELET
Abs Immature Granulocytes: 0.05 10*3/uL (ref 0.00–0.07)
Basophils Absolute: 0.1 10*3/uL (ref 0.0–0.1)
Basophils Relative: 1 %
Eosinophils Absolute: 0.2 10*3/uL (ref 0.0–0.5)
Eosinophils Relative: 2 %
HCT: 43.7 % (ref 39.0–52.0)
Hemoglobin: 13.5 g/dL (ref 13.0–17.0)
Immature Granulocytes: 1 %
Lymphocytes Relative: 13 %
Lymphs Abs: 1.4 10*3/uL (ref 0.7–4.0)
MCH: 29.4 pg (ref 26.0–34.0)
MCHC: 30.9 g/dL (ref 30.0–36.0)
MCV: 95.2 fL (ref 80.0–100.0)
Monocytes Absolute: 0.6 10*3/uL (ref 0.1–1.0)
Monocytes Relative: 5 %
Neutro Abs: 8.6 10*3/uL — ABNORMAL HIGH (ref 1.7–7.7)
Neutrophils Relative %: 78 %
Platelets: 203 10*3/uL (ref 150–400)
RBC: 4.59 MIL/uL (ref 4.22–5.81)
RDW: 13.3 % (ref 11.5–15.5)
WBC: 10.9 10*3/uL — ABNORMAL HIGH (ref 4.0–10.5)
nRBC: 0 % (ref 0.0–0.2)

## 2018-09-03 LAB — BASIC METABOLIC PANEL
Anion gap: 21 — ABNORMAL HIGH (ref 5–15)
BUN: 117 mg/dL — ABNORMAL HIGH (ref 6–20)
CO2: 17 mmol/L — ABNORMAL LOW (ref 22–32)
Calcium: 8.1 mg/dL — ABNORMAL LOW (ref 8.9–10.3)
Chloride: 91 mmol/L — ABNORMAL LOW (ref 98–111)
Creatinine, Ser: 17.4 mg/dL — ABNORMAL HIGH (ref 0.61–1.24)
GFR calc Af Amer: 3 mL/min — ABNORMAL LOW (ref >60)
GFR calc non Af Amer: 3 mL/min — ABNORMAL LOW (ref >60)
Glucose, Bld: 259 mg/dL — ABNORMAL HIGH (ref 70–99)
Potassium: 7.2 mmol/L (ref 3.5–5.1)
Sodium: 129 mmol/L — ABNORMAL LOW (ref 135–145)

## 2018-09-03 LAB — CBG MONITORING, ED: GLUCOSE-CAPILLARY: 153 mg/dL — AB (ref 70–99)

## 2018-09-03 MED ORDER — HEPARIN SODIUM (PORCINE) 1000 UNIT/ML IJ SOLN
INTRAMUSCULAR | Status: AC
  Administered 2018-09-03: 3800 [IU] via INTRAVENOUS
  Filled 2018-09-03: qty 4

## 2018-09-03 MED ORDER — SODIUM CHLORIDE 0.9 % IV SOLN: 100.0000 mL | INTRAVENOUS | Status: DC | PRN

## 2018-09-03 MED ORDER — ALTEPLASE 2 MG IJ SOLR: 2.0000 mg | Freq: Once | INTRAMUSCULAR | Status: DC | PRN

## 2018-09-03 MED ORDER — SODIUM BICARBONATE 8.4 % IV SOLN
50.0000 meq | Freq: Once | INTRAVENOUS | Status: AC
  Administered 2018-09-03: 50 meq via INTRAVENOUS
  Filled 2018-09-03: qty 50

## 2018-09-03 MED ORDER — HEPARIN SODIUM (PORCINE) 1000 UNIT/ML DIALYSIS: 1000.0000 [IU] | INTRAMUSCULAR | Status: DC | PRN

## 2018-09-03 MED ORDER — LIDOCAINE-PRILOCAINE 2.5-2.5 % EX CREA: 1.0000 "application " | TOPICAL_CREAM | CUTANEOUS | Status: DC | PRN

## 2018-09-03 MED ORDER — INSULIN ASPART 100 UNIT/ML IV SOLN
10.0000 [IU] | Freq: Once | INTRAVENOUS | Status: AC
  Administered 2018-09-03: 10 [IU] via INTRAVENOUS

## 2018-09-03 MED ORDER — LIDOCAINE HCL (PF) 1 % IJ SOLN: 5.0000 mL | INTRAMUSCULAR | Status: DC | PRN

## 2018-09-03 MED ORDER — CHLORHEXIDINE GLUCONATE CLOTH 2 % EX PADS: 6.0000 | MEDICATED_PAD | Freq: Every day | CUTANEOUS | Status: DC

## 2018-09-03 MED ORDER — HEPARIN SODIUM (PORCINE) 1000 UNIT/ML IJ SOLN
3800.0000 [IU] | Freq: Once | INTRAMUSCULAR | Status: AC
  Administered 2018-09-03: 3800 [IU] via INTRAVENOUS

## 2018-09-03 MED ORDER — CALCIUM GLUCONATE-NACL 1-0.675 GM/50ML-% IV SOLN
1.0000 g | Freq: Once | INTRAVENOUS | Status: AC
  Administered 2018-09-03: 1000 mg via INTRAVENOUS
  Filled 2018-09-03: qty 50

## 2018-09-03 MED ORDER — PENTAFLUOROPROP-TETRAFLUOROETH EX AERO
1.0000 "application " | INHALATION_SPRAY | CUTANEOUS | Status: DC | PRN
  Filled 2018-09-03: qty 116

## 2018-09-03 NOTE — ED Notes (Signed)
Pt transported to XR.  

## 2018-09-03 NOTE — Progress Notes (Signed)
HD tx completed @ 1949 w/o problem UF goal met Blood rinsed back VSS PIV removed Pt d/c from HD via w/c w/ transport to ED where he has ride waiting for transport home

## 2018-09-03 NOTE — Progress Notes (Signed)
Received pt from [facility], RN HD tx initiated via HD cath w/o problem @ 1549 per report Bilat ports: pull/push/flush equally w/o problem per report VSS per report Will continue to monitor while on HD tx
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
## 2018-09-03 NOTE — ED Triage Notes (Addendum)
Dialysis pt,  Last one yesterday , took him down to dry weight ,Feeling weak the last day or 2 , has a dry cough,  denies sob or cp ,  Feels like no energy, states last time this happened his hgb was low,

## 2018-09-03 NOTE — ED Notes (Signed)
Pt ok to have cup of ice per MD.

## 2018-09-03 NOTE — Progress Notes (Addendum)
Pt. Arrived to HD unit alert and oriented. Denies any distress. Pt. Fistula absent of bruit and thrill Dr. Jonnie Finner made aware at bedside. Orders to use pt. TDC.

## 2018-09-03 NOTE — ED Notes (Signed)
CRITICAL VALUE ALERT  Critical Value:  Potassium 7.2  Date & Time Notied:  09/03/18 @ 1313  Provider Notified: Dr. Vanita Panda  Orders Received/Actions taken: MD aware, orders pending

## 2018-09-04 ENCOUNTER — Other Ambulatory Visit
Admission: RE | Admit: 2018-09-04 | Discharge: 2018-09-04 | Disposition: A | Discharge status: Home / Self Care | Payer: Medicare Other | Source: Ambulatory Visit | Attending: Nephrology | Admitting: Nephrology

## 2018-09-04 DIAGNOSIS — N186 End stage renal disease: Secondary | ICD-10-CM | POA: Insufficient documentation

## 2018-09-04 LAB — HEPATITIS B SURFACE ANTIGEN: HEP B S AG: NEGATIVE

## 2018-09-04 LAB — POTASSIUM: Potassium: 5.8 mmol/L — ABNORMAL HIGH (ref 3.5–5.1)

## 2018-09-08 DIAGNOSIS — Z992 Dependence on renal dialysis: Secondary | ICD-10-CM | POA: Diagnosis not present

## 2018-09-08 DIAGNOSIS — N186 End stage renal disease: Secondary | ICD-10-CM | POA: Diagnosis not present

## 2018-09-19 MED FILL — OXYCODONE-ACETAMINOPHEN 10-: 10-325 | 30 days supply | Qty: 60 | Fill #0

## 2018-10-04 ENCOUNTER — Other Ambulatory Visit: Payer: Self-pay

## 2018-10-04 DIAGNOSIS — Z992 Dependence on renal dialysis: Principal | ICD-10-CM

## 2018-10-04 DIAGNOSIS — N186 End stage renal disease: Secondary | ICD-10-CM

## 2018-10-09 DIAGNOSIS — Z992 Dependence on renal dialysis: Secondary | ICD-10-CM | POA: Diagnosis not present

## 2018-10-09 DIAGNOSIS — N186 End stage renal disease: Secondary | ICD-10-CM | POA: Diagnosis not present

## 2018-10-11 ENCOUNTER — Encounter: Payer: Self-pay | Admitting: Family

## 2018-10-11 ENCOUNTER — Other Ambulatory Visit: Payer: Self-pay | Admitting: Vascular Surgery

## 2018-10-11 ENCOUNTER — Other Ambulatory Visit: Payer: Self-pay

## 2018-10-11 ENCOUNTER — Ambulatory Visit (INDEPENDENT_AMBULATORY_CARE_PROVIDER_SITE_OTHER): Payer: Medicare Other | Admitting: Family

## 2018-10-11 ENCOUNTER — Ambulatory Visit (HOSPITAL_COMMUNITY)
Admission: RE | Admit: 2018-10-11 | Discharge: 2018-10-11 | Disposition: A | Discharge status: Home / Self Care | Payer: Medicare Other | Source: Ambulatory Visit | Attending: Family | Admitting: Family

## 2018-10-11 ENCOUNTER — Encounter: Payer: Self-pay | Admitting: Vascular Surgery

## 2018-10-11 VITALS — BP 112/71 | HR 79 | Temp 97.8°F | Resp 20 | Ht 67.0 in | Wt 322.0 lb

## 2018-10-11 DIAGNOSIS — T82590A Other mechanical complication of surgically created arteriovenous fistula, initial encounter: Secondary | ICD-10-CM | POA: Diagnosis not present

## 2018-10-11 DIAGNOSIS — Z992 Dependence on renal dialysis: Secondary | ICD-10-CM

## 2018-10-11 DIAGNOSIS — N186 End stage renal disease: Secondary | ICD-10-CM

## 2018-10-11 DIAGNOSIS — Z6841 Body Mass Index (BMI) 40.0 and over, adult: Secondary | ICD-10-CM | POA: Diagnosis not present

## 2018-10-11 NOTE — Progress Notes (Signed)
CC: malfunction of AV fistula, Established Dialysis Access  History of Present Illness  Seth [name] is a 53 y.o. (1966-05-11) male who is s/p revision of right arm radiocephalic fistula with interposition Artegraft and partial excision of AV fistula and associated skin by Dr. Donzetta Matters on 05/31/2018.   Pt was last evaluated on 06-27-18 by M. Eveland PA-C. At that time plan was to allow dialysis center to use revised right arm fistula.  If not functioning properly, we will then schedule patient for a fistulogram.  Pt returns today at the request of Dr. Augustin Coupe for difficulty cannulating.  Pt states Dr. Augustin Coupe recently tried to open up the blockage in his AVF.  He is dialyzing M-W-F via right IJ TDC, which has no dressing on it, pt states the dressing fell off last night.  He uses a C-PAP.    Past Medical History:  Diagnosis Date  . Diabetes mellitus    INSULIN DEPENDENT DIABETES  . Dialysis patient ([place])   . 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 ESRD (end stage renal disease) on dialysis [facility])    Dialysis M/W/F  . Hypertension   . 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 Sleep apnea    USES CPAP    Social History Social History   Tobacco Use  . Smoking status: Never Smoker  . Smokeless tobacco: Never Used  Substance Use Topics  . Alcohol use: No    Frequency: Never  . Drug use: Not Currently    Types: Marijuana    Comment: ocassionaly     Family History Family History  Problem Relation Age of Onset  . Diabetes Mother   . Cancer Mother   . Diabetes Sister   . Diabetes Brother     Surgical History Past Surgical History:  Procedure Laterality Date  . AV FISTULA PLACEMENT  05/03/2012   Procedure: ARTERIOVENOUS (AV) FISTULA CREATION;  Surgeon: Mal [name], MD;  Location: [place];  Service: Vascular;  Laterality: Right;  . ORIF MANDIBULAR FRACTURE N/A 11/10/2017   Procedure: OPEN REDUCTION INTERNAL FIXATION (ORIF) MANDIBULAR FRACTURE POSSIBLE  MAXILLARY MANDIBULAR FIXATION;  Surgeon: Jodi Marble, MD;  Location: [place];  Service: ENT;  Laterality: N/A;  . REVISON OF ARTERIOVENOUS FISTULA Right 05/31/2018   Procedure: REVISION OF ARTERIOVENOUS FISTULA  RIGHT ARM WITH INTERPOSITION ARTEGRAFT 6MM X 16CM GRAFT;  Surgeon: Waynetta Sandy, MD;  Location: [place];  Service: Vascular;  Laterality: Right;  . TEE WITHOUT CARDIOVERSION N/A 07/10/2015   Procedure: TRANSESOPHAGEAL ECHOCARDIOGRAM (TEE);  Surgeon: Josue Hector, MD;  Location: [facility] ENDOSCOPY;  Service: Cardiovascular;  Laterality: N/A;  . TRACHEOSTOMY TUBE PLACEMENT N/A 08/20/2013   Procedure: TRACHEOSTOMY Revision;  Surgeon: Melida Quitter, MD;  Location: [place];  Service: ENT;  Laterality: N/A;    No Known Allergies  Current Outpatient Medications  Medication Sig Dispense Refill  . amLODipine (NORVASC) 10 MG tablet Take 1 tablet (10 mg total) by mouth daily. 90 tablet 1  . aspirin EC 81 MG tablet Take 81 mg by mouth daily.    Marland Kitchen atorvastatin (LIPITOR) 40 MG tablet Take 1 tablet (40 mg total) by mouth daily at 6 PM. 90 tablet 3  . AURYXIA 1 GM 210 MG(Fe) tablet Take 630 mg by mouth 3 (three) times daily with meals.     . diclofenac sodium (VOLTAREN) 1 % GEL Apply 4 g topically 4 (four) times daily. (Patient taking differently: Apply 4  g topically daily as needed (knee pain). ) 100 g 2  . hydrALAZINE (APRESOLINE) 25 MG tablet Take 25 mg by mouth 2 (two) times daily.   2  . insulin aspart (NOVOLOG FLEXPEN) 100 UNIT/ML FlexPen Inject 0-20 Units into the skin 3 (three) times daily with meals. CBG < 70: drink orange juice and recheck glucose or use glucose tabs. (Patient taking differently: Inject 0-30 Units into the skin 3 (three) times daily with meals. CBG < 70: drink orange juice and recheck glucose or use glucose tabs.) 15 mL PRN  . oxyCODONE-acetaminophen (PERCOCET) 10-325 MG tablet Take 1 tablet by mouth 2 (two) times daily as needed for pain.  0  . triamcinolone cream  (KENALOG) 0.1 % Apply 1 application topically 2 (two) times daily. 45 g 0   No current facility-administered medications for this visit.      REVIEW OF SYSTEMS: see HPI for pertinent positives and negatives    PHYSICAL EXAMINATION:  Vitals:   10/11/18 1215  BP: 112/71  Pulse: 79  Resp: 20  Temp: 97.8 F (36.6 C)  SpO2: 93%  Weight: (!) 322 lb (146.1 kg)  Height: 5\' 7"  (1.702 m)   Body mass index is 50.43 kg/m.  General: Severely morbidly obese male  HEENT:  Large neck.  Pulmonary: Respirations are snorting, somewhat labored at rest.  Abdomen: Soft and non-tender, obese Musculoskeletal: There are no major deformities.   Neurologic: No focal weakness or paresthesias are detected Skin: There are no ulcer or rashes noted. Psychiatric: The patient has normal affect. Cardiovascular: There is a regular rate and rhythm without significant murmur appreciated.  Right radial pulse is 1+ palpable, left is 2+ palpable.  Right AVF with audible and palpable pulse, but no bruit, no thrill.   DATA  Right forearm AV fistula Duplex (10-11-18): Findings: +--------------------+----------+-----------------+--------+ AVF                 PSV (cm/s)Flow Vol (mL/min)Comments +--------------------+----------+-----------------+--------+ Native artery inflow    79           377                +--------------------+----------+-----------------+--------+ AVF Anastomosis        823                              +--------------------+----------+-----------------+--------+    +------------+----------+-------------+----------+--------+ OUTFLOW VEINPSV (cm/s)Diameter (cm)Depth (cm)Describe +------------+----------+-------------+----------+--------+ AC Fossa        55        0.71        1.53            +------------+----------+-------------+----------+--------+ Prox Forearm    71        0.72        0.59             +------------+----------+-------------+----------+--------+ Mid Forearm    198        0.78        0.50            +------------+----------+-------------+----------+--------+ Dist Forearm   278        0.58        0.52            +------------+----------+-------------+----------+--------+  Summary: Patent AVF with low flow volume in to the AVF and suspicious high peak systolic velocity at the anastomosis. (823 cm/sec).    Medical Decision Making  Seth [name] is a 53 y.o. male who is  s/p revision of right arm radiocephalic fistula with interposition Artegraft and partial excision of AV fistula and associated skin by Dr. Donzetta Matters on 05/31/2018.    Pt returns today at the request of Dr. Augustin Coupe for difficulty cannulating.  Pt states Dr. Augustin Coupe recently tried to open up the blockage in his AVF.   Right arm AVF has a palpable pulse, but no thrill, no bruit.    Today's right forearm AVF duplex shows low flow volume in to the AVF and suspicious high peak systolic velocity at the anastomosis. (823 cm/sec).   He is dialyzing M-W-F via right IJ TDC.   I discussed pt HPI and exam results with Dr. Scot Dock, will schedule for fistula gram ASAP for next week, first available surgeon. He dialyzes tomorrow (Friday).   Pt BMI is 50.   Clemon Chambers, RN, MSN, FNP-C [facility] Office: [phone]  10/11/2018, 12:53 PM  Clinic MD: Scot Dock in vein clinic, Early on call

## 2018-10-18 ENCOUNTER — Encounter (HOSPITAL_COMMUNITY): Payer: Self-pay | Admitting: Vascular Surgery

## 2018-10-18 ENCOUNTER — Encounter: Payer: Self-pay | Admitting: *Deleted

## 2018-10-18 ENCOUNTER — Ambulatory Visit (HOSPITAL_BASED_OUTPATIENT_CLINIC_OR_DEPARTMENT_OTHER): Payer: Medicare Other

## 2018-10-18 ENCOUNTER — Ambulatory Visit (HOSPITAL_COMMUNITY)
Admission: RE | Admit: 2018-10-18 | Discharge: 2018-10-18 | Disposition: A | Discharge status: Home / Self Care | Payer: Medicare Other | Attending: Vascular Surgery | Admitting: Vascular Surgery

## 2018-10-18 ENCOUNTER — Encounter (HOSPITAL_COMMUNITY)
Admission: RE | Disposition: A | Discharge status: Home / Self Care | Payer: Self-pay | Source: Home / Self Care | Attending: Vascular Surgery

## 2018-10-18 ENCOUNTER — Other Ambulatory Visit: Payer: Self-pay | Admitting: *Deleted

## 2018-10-18 DIAGNOSIS — T82510A Breakdown (mechanical) of surgically created arteriovenous fistula, initial encounter: Secondary | ICD-10-CM | POA: Diagnosis not present

## 2018-10-18 DIAGNOSIS — Z833 Family history of diabetes mellitus: Secondary | ICD-10-CM | POA: Diagnosis not present

## 2018-10-18 DIAGNOSIS — Z01810 Encounter for preprocedural cardiovascular examination: Secondary | ICD-10-CM

## 2018-10-18 DIAGNOSIS — Z6841 Body Mass Index (BMI) 40.0 and over, adult: Secondary | ICD-10-CM | POA: Insufficient documentation

## 2018-10-18 DIAGNOSIS — N186 End stage renal disease: Secondary | ICD-10-CM | POA: Insufficient documentation

## 2018-10-18 DIAGNOSIS — I120 Hypertensive chronic kidney disease with stage 5 chronic kidney disease or end stage renal disease: Secondary | ICD-10-CM | POA: Diagnosis not present

## 2018-10-18 DIAGNOSIS — G4730 Sleep apnea, unspecified: Secondary | ICD-10-CM | POA: Diagnosis not present

## 2018-10-18 DIAGNOSIS — Z794 Long term (current) use of insulin: Secondary | ICD-10-CM | POA: Insufficient documentation

## 2018-10-18 DIAGNOSIS — Z992 Dependence on renal dialysis: Secondary | ICD-10-CM | POA: Diagnosis not present

## 2018-10-18 DIAGNOSIS — Y832 Surgical operation with anastomosis, bypass or graft as the cause of abnormal reaction of the patient, or of later complication, without mention of misadventure at the time of the procedure: Secondary | ICD-10-CM | POA: Insufficient documentation

## 2018-10-18 DIAGNOSIS — E1122 Type 2 diabetes mellitus with diabetic chronic kidney disease: Secondary | ICD-10-CM | POA: Insufficient documentation

## 2018-10-18 DIAGNOSIS — T82898A Other specified complication of vascular prosthetic devices, implants and grafts, initial encounter: Secondary | ICD-10-CM | POA: Diagnosis not present

## 2018-10-18 DIAGNOSIS — Z79899 Other long term (current) drug therapy: Secondary | ICD-10-CM | POA: Insufficient documentation

## 2018-10-18 DIAGNOSIS — Z955 Presence of coronary angioplasty implant and graft: Secondary | ICD-10-CM | POA: Diagnosis not present

## 2018-10-18 DIAGNOSIS — Z7982 Long term (current) use of aspirin: Secondary | ICD-10-CM | POA: Insufficient documentation

## 2018-10-18 HISTORY — PX: A/V FISTULAGRAM: CATH118298

## 2018-10-18 LAB — POCT I-STAT, CHEM 8
BUN: 60 mg/dL — ABNORMAL HIGH (ref 6–20)
CREATININE: 10.7 mg/dL — AB (ref 0.61–1.24)
Calcium, Ion: 1.01 mmol/L — ABNORMAL LOW (ref 1.15–1.40)
Chloride: 98 mmol/L (ref 98–111)
GLUCOSE: 271 mg/dL — AB (ref 70–99)
HEMATOCRIT: 43 % (ref 39.0–52.0)
Hemoglobin: 14.6 g/dL (ref 13.0–17.0)
Potassium: 5.1 mmol/L (ref 3.5–5.1)
Sodium: 133 mmol/L — ABNORMAL LOW (ref 135–145)
TCO2: 27 mmol/L (ref 22–32)

## 2018-10-18 LAB — GLUCOSE, CAPILLARY: Glucose-Capillary: 256 mg/dL — ABNORMAL HIGH (ref 70–99)

## 2018-10-18 SURGERY — A/V FISTULAGRAM
Anesthesia: LOCAL

## 2018-10-18 MED ORDER — HEPARIN (PORCINE) IN NACL 1000-0.9 UT/500ML-% IV SOLN
INTRAVENOUS | Status: DC | PRN
  Administered 2018-10-18: 500 mL

## 2018-10-18 MED ORDER — HEPARIN (PORCINE) IN NACL 1000-0.9 UT/500ML-% IV SOLN
INTRAVENOUS | Status: AC
  Filled 2018-10-18: qty 500

## 2018-10-18 MED ORDER — SODIUM CHLORIDE 0.9 % IV SOLN: 250.0000 mL | INTRAVENOUS | Status: DC | PRN

## 2018-10-18 MED ORDER — LIDOCAINE HCL (PF) 1 % IJ SOLN
INTRAMUSCULAR | Status: AC
  Filled 2018-10-18: qty 30

## 2018-10-18 MED ORDER — SODIUM CHLORIDE 0.9% FLUSH: 3.0000 mL | INTRAVENOUS | Status: DC | PRN

## 2018-10-18 MED ORDER — SODIUM CHLORIDE 0.9% FLUSH: 3.0000 mL | Freq: Two times a day (BID) | INTRAVENOUS | Status: DC

## 2018-10-18 MED ORDER — LIDOCAINE HCL (PF) 1 % IJ SOLN
INTRAMUSCULAR | Status: DC | PRN
  Administered 2018-10-18: 2 mL

## 2018-10-18 MED ORDER — IODIXANOL 320 MG/ML IV SOLN
INTRAVENOUS | Status: DC | PRN
  Administered 2018-10-18: 120 mL via INTRAVENOUS

## 2018-10-18 SURGICAL SUPPLY — 14 items
BAG SNAP BAND KOVER 36X36 (MISCELLANEOUS) ×2 IMPLANT
CATH BEACON 5 .035 65 KMP TIP (CATHETERS) ×1 IMPLANT
COVER DOME SNAP 22 D (MISCELLANEOUS) ×2 IMPLANT
KIT MICROPUNCTURE NIT STIFF (SHEATH) ×1 IMPLANT
KIT PV (KITS) ×1 IMPLANT
PROTECTION STATION PRESSURIZED (MISCELLANEOUS) ×2
SHEATH PINNACLE R/O II 5F 6CM (SHEATH) ×1 IMPLANT
SHEATH PROBE COVER 6X72 (BAG) ×3 IMPLANT
STATION PROTECTION PRESSURIZED (MISCELLANEOUS) ×1 IMPLANT
STOPCOCK MORSE 400PSI 3WAY (MISCELLANEOUS) ×3 IMPLANT
TRAY PV CATH (CUSTOM PROCEDURE TRAY) ×2 IMPLANT
TUBING CIL FLEX 10 FLL-RA (TUBING) ×1 IMPLANT
WIRE BENTSON .035X145CM (WIRE) ×1 IMPLANT
WIRE TORQFLEX AUST .018X40CM (WIRE) ×2 IMPLANT

## 2018-10-18 NOTE — H&P (Signed)
History and Physical Interval Note:  10/18/2018 7:42 AM  Seth [name]  has presented today for surgery, with the diagnosis of Renal disease  The various methods of treatment have been discussed with the patient and family. After consideration of risks, benefits and other options for treatment, the patient has consented to  Procedure(s): A/V FISTULAGRAM (N/A) as a surgical intervention .  The patient's history has been reviewed, patient examined, no change in status, stable for surgery.  I have reviewed the patient's chart and labs.  Questions were answered to the patient's satisfaction.    Right upper extremity fistulogram.  Seth [name]  CC: malfunction of AV fistula, Established Dialysis Access  History of Present Illness  Seth [name] is a 53 y.o. (Jun 23, 1966) male who is s/p revision of right arm radiocephalic fistula with interposition Artegraft and partial excision of AV fistula and associated skin by Dr. Donzetta Matters on 05/31/2018.  Pt was last evaluated on 06-27-18 by M. Eveland PA-C. At that time plan was to allow dialysis center to use revised right arm fistula. If not functioning properly, we will then schedule patient for a fistulogram.  Pt returns today at the request of Dr. Augustin Coupe for difficulty cannulating.  Pt states Dr. Augustin Coupe recently tried to open up the blockage in his AVF.  He is dialyzing M-W-F via right IJ TDC, which has no dressing on it, pt states the dressing fell off last night.  He uses a C-PAP.        Past Medical History:  Diagnosis Date  . Diabetes mellitus    INSULIN DEPENDENT DIABETES  . Dialysis patient ([place])   . 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 ESRD (end stage renal disease) on dialysis [facility])    Dialysis M/W/F  . Hypertension   . 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 Sleep apnea    USES CPAP    Social History Social History        Tobacco Use  . Smoking status: Never Smoker  .  Smokeless tobacco: Never Used  Substance Use Topics  . Alcohol use: No    Frequency: Never  . Drug use: Not Currently    Types: Marijuana    Comment: ocassionaly     Family History      Family History  Problem Relation Age of Onset  . Diabetes Mother   . Cancer Mother   . Diabetes Sister   . Diabetes Brother     Surgical History      Past Surgical History:  Procedure Laterality Date  . AV FISTULA PLACEMENT  05/03/2012   Procedure: ARTERIOVENOUS (AV) FISTULA CREATION;  Surgeon: Mal [name], MD;  Location: [place];  Service: Vascular;  Laterality: Right;  . ORIF MANDIBULAR FRACTURE N/A 11/10/2017   Procedure: OPEN REDUCTION INTERNAL FIXATION (ORIF) MANDIBULAR FRACTURE POSSIBLE MAXILLARY MANDIBULAR FIXATION;  Surgeon: Jodi Marble, MD;  Location: [place];  Service: ENT;  Laterality: N/A;  . REVISON OF ARTERIOVENOUS FISTULA Right 05/31/2018   Procedure: REVISION OF ARTERIOVENOUS FISTULA  RIGHT ARM WITH INTERPOSITION ARTEGRAFT 6MM X 16CM GRAFT;  Surgeon: Waynetta Sandy, MD;  Location: [place];  Service: Vascular;  Laterality: Right;  . TEE WITHOUT CARDIOVERSION N/A 07/10/2015   Procedure: TRANSESOPHAGEAL ECHOCARDIOGRAM (TEE);  Surgeon: Josue Hector, MD;  Location: [place];  Service: Cardiovascular;  Laterality: N/A;  . TRACHEOSTOMY TUBE PLACEMENT N/A 08/20/2013   Procedure: TRACHEOSTOMY Revision;  Surgeon: Melida Quitter, MD;  Location: [facility];  Service: ENT;  Laterality: N/A;    No Known Allergies        Current Outpatient Medications  Medication Sig Dispense Refill  . amLODipine (NORVASC) 10 MG tablet Take 1 tablet (10 mg total) by mouth daily. 90 tablet 1  . aspirin EC 81 MG tablet Take 81 mg by mouth daily.    Marland Kitchen atorvastatin (LIPITOR) 40 MG tablet Take 1 tablet (40 mg total) by mouth daily at 6 PM. 90 tablet 3  . AURYXIA 1 GM 210 MG(Fe) tablet Take 630 mg by mouth 3 (three) times daily with meals.     . diclofenac sodium (VOLTAREN) 1 %  GEL Apply 4 g topically 4 (four) times daily. (Patient taking differently: Apply 4 g topically daily as needed (knee pain). ) 100 g 2  . hydrALAZINE (APRESOLINE) 25 MG tablet Take 25 mg by mouth 2 (two) times daily.   2  . insulin aspart (NOVOLOG FLEXPEN) 100 UNIT/ML FlexPen Inject 0-20 Units into the skin 3 (three) times daily with meals. CBG < 70: drink orange juice and recheck glucose or use glucose tabs. (Patient taking differently: Inject 0-30 Units into the skin 3 (three) times daily with meals. CBG < 70: drink orange juice and recheck glucose or use glucose tabs.) 15 mL PRN  . oxyCODONE-acetaminophen (PERCOCET) 10-325 MG tablet Take 1 tablet by mouth 2 (two) times daily as needed for pain.  0  . triamcinolone cream (KENALOG) 0.1 % Apply 1 application topically 2 (two) times daily. 45 g 0   No current facility-administered medications for this visit.      REVIEW OF SYSTEMS: see HPI for pertinent positives and negatives    PHYSICAL EXAMINATION:     Vitals:   10/11/18 1215  BP: 112/71  Pulse: 79  Resp: 20  Temp: 97.8 F (36.6 C)  SpO2: 93%  Weight: (!) 322 lb (146.1 kg)  Height: 5\' 7"  (1.702 m)   Body mass index is 50.43 kg/m.  General: Severely morbidly obese male  HEENT:  Large neck.  Pulmonary: Respirations are snorting, somewhat labored at rest.  Abdomen: Soft and non-tender, obese Musculoskeletal: There are no major deformities.   Neurologic: No focal weakness or paresthesias are detected Skin: There are no ulcer or rashes noted. Psychiatric: The patient has normal affect. Cardiovascular: There is a regular rate and rhythm without significant murmur appreciated.  Right radial pulse is 1+ palpable, left is 2+ palpable.  Right AVF with audible and palpable pulse, but no bruit, no thrill.   DATA  Right forearm AV fistula Duplex (10-11-18): Findings: +--------------------+----------+-----------------+--------+ AVF PSV (cm/s)Flow Vol  (mL/min)Comments +--------------------+----------+-----------------+--------+ Native artery inflow 79  377   +--------------------+----------+-----------------+--------+ AVF Anastomosis  823    +--------------------+----------+-----------------+--------+   +------------+----------+-------------+----------+--------+ OUTFLOW VEINPSV (cm/s)Diameter (cm)Depth (cm)Describe +------------+----------+-------------+----------+--------+ AC Fossa  55  0.71  1.53   +------------+----------+-------------+----------+--------+ Prox Forearm 71  0.72  0.59   +------------+----------+-------------+----------+--------+ Mid Forearm  198  0.78  0.50   +------------+----------+-------------+----------+--------+ Dist Forearm 278  0.58  0.52   +------------+----------+-------------+----------+--------+  Summary: Patent AVF with low flow volume in to the AVF and suspicious high peak systolic velocity at the anastomosis. (823 cm/sec).    Medical Decision Making  Seth [name] is a 53 y.o. male who is s/p revision of right arm radiocephalic fistula with interposition Artegraft and partial excision of AV fistula and associated skin by Dr. Donzetta Matters on 05/31/2018.   Pt returns today at the request of Dr. Augustin Coupe for difficulty cannulating.  Pt states Dr. Augustin Coupe recently tried to open up the blockage in his AVF.   Right arm AVF has a palpable pulse, but no thrill, no bruit.    Today's right forearm AVF duplex shows low flow volume in to the AVF and suspicious high peak systolic velocity at the anastomosis. (823 cm/sec).   He is dialyzing M-W-F via right IJ TDC.   I discussed pt HPI and exam results with Dr. Scot Dock, will schedule for fistula gram ASAP for next week, first available surgeon. He dialyzes tomorrow  (Friday).   Pt BMI is 50.   Clemon Chambers, RN, MSN, FNP-C [facility] [place] Office: [phone]  10/11/2018, 12:53 PM  Clinic MD: Scot Dock in vein clinic, Early on call

## 2018-10-18 NOTE — Op Note (Signed)
OPERATIVE NOTE   PROCEDURE: 1. right radiocephalic arteriovenous cannulation under ultrasound guidance 2. right arm fistulogram including central venogram 3. Left upper extremity venogram  PRE-OPERATIVE DIAGNOSIS: Malfunctioning right arteriovenous fistula  POST-OPERATIVE DIAGNOSIS: same as above   SURGEON: Marty Heck, MD  ANESTHESIA: local  ESTIMATED BLOOD LOSS: 5 cc  FINDING(S): 1. Patient previously had a right radiocephalic AV fistula that was then revised with Artegraft interposition in the right forearm this summer by Dr. Donzetta Matters.  He presents as a referral from [facility] because of low flow volumes and difficulty cannulating his fistula.  Right upper extremity fistulogram shows a patent Artegraft in the right forearm that drains dominantly through the deep brachial system with more sluggish flow up the cephalic vein in the upper arm.  The patient has a tunneled right IJ dialysis catheter with an apparent right innominate occlusion and collaterals in the chest wall the fill the superior vena cava.  After evaluating the pictures the patient will probably be best served with left-sided upper extremity access.  Left upper extremity venogram was obtained that showed no central venous stenosis on the left and appears to have a patent deep brachial system as well as fairly decent basilic vein.  We will get vein mapping to confirm.  SPECIMEN(S):  None  INDICATIONS: Seth [name] is a 53 y.o. male who  presents with malfunctioning right radiocephalic arteriovenous fistula.  The patient is scheduled for right upper extremity fistulogram.  The patient is aware of the risks include but are not limited to: bleeding, infection, thrombosis of the cannulated access, and possible anaphylactic reaction to the contrast.  The patient is aware of the risks of the procedure and elects to proceed forward.  DESCRIPTION: After full informed written consent was obtained, the patient was  brought back to the angiography suite and placed supine upon the angiography table.  The patient was connected to monitoring equipment.  The right arm was prepped and draped in the standard fashion for a right fistulogram.  The artegraft was evaluated with ultrasound and it was patent and an image was saved.  Under ultrasound guidance, the right radiocephalic arteriovenous fistula was cannulated with a micropuncture needle.  The microwire was advanced into the fistula and the needle was exchanged for the a microsheath, which was lodged 2 cm into the access.  The wire was removed and the sheath was connected to the IV extension tubing.  Hand injections were completed to image the access from the antecubitum up to the level of axilla.  The central venous structures were also imaged by hand injections.  Given there was concern for central venous occlusion that I cannot fully evaluate by injecting the fistula in the forearm I did upsized my sheath over a Bentson wire to a short 5 [place] sheath and then used a Bentson wire and KMP catheter to get my catheter up the brachial vein into the axillary vein where I obtain some additional hand injections.  This confirmed that the patient had a right innominate occlusion where the right IJ tunnel catheter crosses and has multiple collaterals in the chest wall that ultimately feel the superior vena cava.  Based on the images, this patient will need: left arm access.  A 4-0 Monocryl purse-string suture was sewn around the sheath.  The sheath was removed while tying down the suture.  A sterile bandage was applied to the puncture site. We then used the IV in his left hand to obtain left upper  extremity venogram given that he will likely be best served with left arm access.  Initially the central venous structures were imaged with no evidence of a central venous stenosis.  His deep brachial veins are patent and he also appeared to have a decent basilic vein in the left upper arm.  I  did not see good cephalic vein based on the venogram imaging.  I will get vein mapping to confirm.  We will arrange for left upper extremity access in the near future.  COMPLICATIONS: None  CONDITION: Stable  Marty Heck, MD [facility] [place] Office: [phone] Pager: [phone]  10/18/2018 8:34 AM

## 2018-10-18 NOTE — Discharge Instructions (Signed)

## 2018-10-18 NOTE — Progress Notes (Addendum)
Left upper extremity vein mapping completed.

## 2018-10-22 ENCOUNTER — Encounter (HOSPITAL_COMMUNITY): Payer: Self-pay | Admitting: *Deleted

## 2018-10-22 ENCOUNTER — Other Ambulatory Visit: Payer: Self-pay

## 2018-10-22 MED ORDER — DEXTROSE 5 % IV SOLN
3.0000 g | INTRAVENOUS | Status: AC
  Administered 2018-10-23: 3 g via INTRAVENOUS
  Filled 2018-10-22: qty 3

## 2018-10-22 MED ORDER — OXYCODONE-ACETAMINOPHEN 10-325 MG PO TABS: 1.0000 | ORAL_TABLET | Freq: Two times a day (BID) | ORAL | 0 refills | Status: DC | PRN

## 2018-10-22 MED FILL — OXYCODONE-ACETAMINOPHEN 10-: 10-325 | 15 days supply | Qty: 30 | Fill #0

## 2018-10-22 NOTE — Telephone Encounter (Signed)
Called pt - informed he needs to scheduled an appt in 4-6 weeks with his PCP - stated no problem. Apt scheduled per front office - 2/19 @ 1345 PM with Dr Alfonse Spruce.

## 2018-10-22 NOTE — Anesthesia Preprocedure Evaluation (Addendum)
Anesthesia Evaluation  Patient identified by MRN, date of birth, ID band Patient awake    Reviewed: Allergy & Precautions, NPO status , Patient's Chart, lab work & pertinent test results  Airway Mallampati: III  TM Distance: >3 FB Neck ROM: Full    Dental  (+) Poor Dentition, Chipped, Missing   Pulmonary sleep apnea and Continuous Positive Airway Pressure Ventilation ,    breath sounds clear to auscultation + decreased breath sounds      Cardiovascular hypertension, Pt. on medications Normal cardiovascular exam Rhythm:Regular Rate:Normal     Neuro/Psych PSYCHIATRIC DISORDERS Anxiety negative neurological ROS     GI/Hepatic   Endo/Other  diabetes, Well Controlled, Type 2, Insulin DependentMorbid Obesity Hyperlipidemia  Renal/GU ESRF and DialysisRenal disease  negative genitourinary   Musculoskeletal  (+) Arthritis , Osteoarthritis,    Abdominal (+) + obese,   Peds  Hematology  (+) anemia ,   Anesthesia Other Findings   Reproductive/Obstetrics                           Anesthesia Physical Anesthesia Plan  ASA: IV  Anesthesia Plan: General   Post-op Pain Management:    Induction: Intravenous  PONV Risk Score and Plan: 2 and Midazolam, Propofol infusion, Ondansetron and Treatment may vary due to age or medical condition  Airway Management Planned: LMA  Additional Equipment:   Intra-op Plan:   Post-operative Plan: Extubation in OR  Informed Consent: I have reviewed the patients History and Physical, chart, labs and discussed the procedure including the risks, benefits and alternatives for the proposed anesthesia with the patient or authorized representative who has indicated his/her understanding and acceptance.   Dental advisory given  Plan Discussed with: CRNA and Surgeon  Anesthesia Plan Comments:       Anesthesia Quick Evaluation

## 2018-10-22 NOTE — Telephone Encounter (Signed)
Last rx written 08/20/18. Last OV 10/1 with Dr Philipp Ovens; 9/12 with Dr Alfonse Spruce. UDS 12/03/15.

## 2018-10-22 NOTE — Telephone Encounter (Signed)
oxyCODONE-acetaminophen (PERCOCET) 10-325 MG tablet   REFILL REQUEST @ [facility].

## 2018-10-22 NOTE — Progress Notes (Signed)
Denies CP, Shob, or cardiology visit. Given the below instructions regarding DM.   How do I manage my blood sugar before surgery? . Check your blood sugar at least 4 times a day, starting 2 days before surgery, to make sure that the level is not too high or low. o Check your blood sugar the morning of your surgery when you wake up and every 2 hours until you get to the Short Stay unit. . If your blood sugar is less than 70 mg/dL, you will need to treat for low blood sugar: o Do not take insulin. o Treat a low blood sugar (less than 70 mg/dL) with  cup of clear juice (cranberry or apple), 4 glucose tablets, OR glucose gel. Recheck blood sugar in 15 minutes after treatment (to make sure it is greater than 70 mg/dL). If your blood sugar is not greater than 70 mg/dL on recheck, call [phone] o  for further instructions. . Report your blood sugar to the short stay nurse when you get to Short Stay.  . If you are admitted to the hospital after surgery: o Your blood sugar will be checked by the staff and you will probably be given insulin after surgery (instead of oral diabetes medicines) to make sure you have good blood sugar levels. o The goal for blood sugar control after surgery is 80-180 mg/dL.    . If your CBG is greater than 220 mg/dL, you may take  of your sliding scale (correction) dose of insulin.  Other Instructions:          Patient Signature:  Date:   Nurse Signature:  Date:   Reviewed and Endorsed by [facility] Patient Education Committee, August 2015

## 2018-10-22 NOTE — Telephone Encounter (Signed)
Last evaluated by me 07/18/18. He does not have a future appointment with me set up. He will need to schedule a follow-up visit in 4-6 weeks for a repeat UDS and to re-evaluate pain control. Can someone help set this up with me?

## 2018-10-23 ENCOUNTER — Ambulatory Visit (HOSPITAL_COMMUNITY): Payer: Medicare Other | Admitting: Anesthesiology

## 2018-10-23 ENCOUNTER — Other Ambulatory Visit: Payer: Self-pay

## 2018-10-23 ENCOUNTER — Ambulatory Visit (HOSPITAL_COMMUNITY)
Admission: RE | Admit: 2018-10-23 | Discharge: 2018-10-23 | Disposition: A | Discharge status: Home / Self Care | Payer: Medicare Other | Attending: Vascular Surgery | Admitting: Vascular Surgery

## 2018-10-23 ENCOUNTER — Encounter (HOSPITAL_COMMUNITY): Payer: Self-pay | Admitting: Vascular Surgery

## 2018-10-23 ENCOUNTER — Encounter (HOSPITAL_COMMUNITY)
Admission: RE | Disposition: A | Discharge status: Home / Self Care | Payer: Self-pay | Source: Home / Self Care | Attending: Vascular Surgery

## 2018-10-23 DIAGNOSIS — E785 Hyperlipidemia, unspecified: Secondary | ICD-10-CM | POA: Insufficient documentation

## 2018-10-23 DIAGNOSIS — T82898A Other specified complication of vascular prosthetic devices, implants and grafts, initial encounter: Secondary | ICD-10-CM | POA: Diagnosis not present

## 2018-10-23 DIAGNOSIS — Z79899 Other long term (current) drug therapy: Secondary | ICD-10-CM | POA: Diagnosis not present

## 2018-10-23 DIAGNOSIS — E118 Type 2 diabetes mellitus with unspecified complications: Secondary | ICD-10-CM | POA: Diagnosis not present

## 2018-10-23 DIAGNOSIS — Z794 Long term (current) use of insulin: Secondary | ICD-10-CM | POA: Diagnosis not present

## 2018-10-23 DIAGNOSIS — E1122 Type 2 diabetes mellitus with diabetic chronic kidney disease: Secondary | ICD-10-CM | POA: Insufficient documentation

## 2018-10-23 DIAGNOSIS — N186 End stage renal disease: Secondary | ICD-10-CM | POA: Diagnosis not present

## 2018-10-23 DIAGNOSIS — Z87828 Personal history of other (healed) physical injury and trauma: Secondary | ICD-10-CM | POA: Insufficient documentation

## 2018-10-23 DIAGNOSIS — Z992 Dependence on renal dialysis: Secondary | ICD-10-CM | POA: Insufficient documentation

## 2018-10-23 DIAGNOSIS — I120 Hypertensive chronic kidney disease with stage 5 chronic kidney disease or end stage renal disease: Secondary | ICD-10-CM | POA: Diagnosis not present

## 2018-10-23 DIAGNOSIS — G4733 Obstructive sleep apnea (adult) (pediatric): Secondary | ICD-10-CM | POA: Diagnosis not present

## 2018-10-23 DIAGNOSIS — Z6841 Body Mass Index (BMI) 40.0 and over, adult: Secondary | ICD-10-CM | POA: Diagnosis not present

## 2018-10-23 DIAGNOSIS — Z7982 Long term (current) use of aspirin: Secondary | ICD-10-CM | POA: Diagnosis not present

## 2018-10-23 HISTORY — PX: AV FISTULA PLACEMENT: SHX1204

## 2018-10-23 LAB — POCT I-STAT 4, (NA,K, GLUC, HGB,HCT)
Glucose, Bld: 250 mg/dL — ABNORMAL HIGH (ref 70–99)
HCT: 38 % — ABNORMAL LOW (ref 39.0–52.0)
Hemoglobin: 12.9 g/dL — ABNORMAL LOW (ref 13.0–17.0)
Potassium: 4.7 mmol/L (ref 3.5–5.1)
Sodium: 134 mmol/L — ABNORMAL LOW (ref 135–145)

## 2018-10-23 LAB — GLUCOSE, CAPILLARY: Glucose-Capillary: 203 mg/dL — ABNORMAL HIGH (ref 70–99)

## 2018-10-23 SURGERY — ARTERIOVENOUS (AV) FISTULA CREATION
Anesthesia: General | Site: Arm Upper | Laterality: Left

## 2018-10-23 MED ORDER — 0.9 % SODIUM CHLORIDE (POUR BTL) OPTIME
TOPICAL | Status: DC | PRN
  Administered 2018-10-23: 1000 mL

## 2018-10-23 MED ORDER — HYDROCODONE-ACETAMINOPHEN 7.5-325 MG PO TABS: 1.0000 | ORAL_TABLET | Freq: Once | ORAL | Status: DC | PRN

## 2018-10-23 MED ORDER — LIDOCAINE 2% (20 MG/ML) 5 ML SYRINGE
INTRAMUSCULAR | Status: DC | PRN
  Administered 2018-10-23: 80 mg via INTRAVENOUS

## 2018-10-23 MED ORDER — DEXMEDETOMIDINE HCL IN NACL 200 MCG/50ML IV SOLN
INTRAVENOUS | Status: AC
  Filled 2018-10-23: qty 50

## 2018-10-23 MED ORDER — ONDANSETRON HCL 4 MG/2ML IJ SOLN
INTRAMUSCULAR | Status: AC
  Filled 2018-10-23: qty 2

## 2018-10-23 MED ORDER — PROPOFOL 10 MG/ML IV BOLUS
INTRAVENOUS | Status: DC | PRN
  Administered 2018-10-23: 150 mg via INTRAVENOUS
  Administered 2018-10-23: 50 mg via INTRAVENOUS

## 2018-10-23 MED ORDER — SUCCINYLCHOLINE CHLORIDE 200 MG/10ML IV SOSY
PREFILLED_SYRINGE | INTRAVENOUS | Status: AC
  Filled 2018-10-23: qty 10

## 2018-10-23 MED ORDER — ONDANSETRON HCL 4 MG/2ML IJ SOLN: 4.0000 mg | Freq: Once | INTRAMUSCULAR | Status: DC | PRN

## 2018-10-23 MED ORDER — LIDOCAINE 2% (20 MG/ML) 5 ML SYRINGE
INTRAMUSCULAR | Status: AC
  Filled 2018-10-23: qty 5

## 2018-10-23 MED ORDER — FENTANYL CITRATE (PF) 100 MCG/2ML IJ SOLN
INTRAMUSCULAR | Status: DC | PRN
  Administered 2018-10-23: 50 ug via INTRAVENOUS

## 2018-10-23 MED ORDER — ONDANSETRON HCL 4 MG/2ML IJ SOLN
INTRAMUSCULAR | Status: DC | PRN
  Administered 2018-10-23: 4 mg via INTRAVENOUS

## 2018-10-23 MED ORDER — DEXMEDETOMIDINE HCL IN NACL 200 MCG/50ML IV SOLN
INTRAVENOUS | Status: DC | PRN
  Administered 2018-10-23: 8 ug via INTRAVENOUS
  Administered 2018-10-23 (×2): 4 ug via INTRAVENOUS

## 2018-10-23 MED ORDER — SODIUM CHLORIDE 0.9 % IV SOLN
INTRAVENOUS | Status: DC | PRN
  Administered 2018-10-23: 08:00:00

## 2018-10-23 MED ORDER — DEXAMETHASONE SODIUM PHOSPHATE 10 MG/ML IJ SOLN
INTRAMUSCULAR | Status: DC | PRN
  Administered 2018-10-23: 4 mg via INTRAVENOUS

## 2018-10-23 MED ORDER — LIDOCAINE-EPINEPHRINE (PF) 1 %-1:200000 IJ SOLN
INTRAMUSCULAR | Status: DC | PRN
  Administered 2018-10-23: 2 mL

## 2018-10-23 MED ORDER — INSULIN ASPART 100 UNIT/ML ~~LOC~~ SOLN
4.0000 [IU] | Freq: Once | SUBCUTANEOUS | Status: AC
  Administered 2018-10-23: 4 [IU] via SUBCUTANEOUS
  Filled 2018-10-23: qty 1

## 2018-10-23 MED ORDER — LIDOCAINE-EPINEPHRINE 1 %-1:100000 IJ SOLN
INTRAMUSCULAR | Status: AC
  Filled 2018-10-23: qty 1

## 2018-10-23 MED ORDER — SODIUM CHLORIDE 0.9 % IV SOLN
INTRAVENOUS | Status: DC | PRN
  Administered 2018-10-23: 25 ug/min via INTRAVENOUS

## 2018-10-23 MED ORDER — MIDAZOLAM HCL 2 MG/2ML IJ SOLN
INTRAMUSCULAR | Status: AC
  Filled 2018-10-23: qty 2

## 2018-10-23 MED ORDER — PROPOFOL 1000 MG/100ML IV EMUL
INTRAVENOUS | Status: AC
  Filled 2018-10-23: qty 100

## 2018-10-23 MED ORDER — LIDOCAINE-EPINEPHRINE 1 %-1:100000 IJ SOLN
INTRAMUSCULAR | Status: AC
  Filled 2018-10-23: qty 2

## 2018-10-23 MED ORDER — SODIUM CHLORIDE 0.9 % IV SOLN
INTRAVENOUS | Status: AC
  Filled 2018-10-23: qty 1.2

## 2018-10-23 MED ORDER — PROPOFOL 10 MG/ML IV BOLUS
INTRAVENOUS | Status: AC
  Filled 2018-10-23: qty 20

## 2018-10-23 MED ORDER — FENTANYL CITRATE (PF) 250 MCG/5ML IJ SOLN
INTRAMUSCULAR | Status: AC
  Filled 2018-10-23: qty 5

## 2018-10-23 MED ORDER — FENTANYL CITRATE (PF) 100 MCG/2ML IJ SOLN: 25.0000 ug | INTRAMUSCULAR | Status: DC | PRN

## 2018-10-23 MED ORDER — SODIUM CHLORIDE 0.9 % IV SOLN
INTRAVENOUS | Status: DC
  Administered 2018-10-23: 07:00:00 via INTRAVENOUS

## 2018-10-23 SURGICAL SUPPLY — 29 items
ARMBAND PINK RESTRICT EXTREMIT (MISCELLANEOUS) ×3 IMPLANT
CANISTER SUCT 3000ML PPV (MISCELLANEOUS) ×3 IMPLANT
CLIP VESOCCLUDE MED 6/CT (CLIP) ×3 IMPLANT
CLIP VESOCCLUDE SM WIDE 6/CT (CLIP) ×3 IMPLANT
COVER PROBE W GEL 5X96 (DRAPES) ×2 IMPLANT
COVER WAND RF STERILE (DRAPES) ×1 IMPLANT
DERMABOND ADVANCED (GAUZE/BANDAGES/DRESSINGS) ×2
DERMABOND ADVANCED .7 DNX12 (GAUZE/BANDAGES/DRESSINGS) ×1 IMPLANT
ELECT REM PT RETURN 9FT ADLT (ELECTROSURGICAL) ×3
ELECTRODE REM PT RTRN 9FT ADLT (ELECTROSURGICAL) ×1 IMPLANT
GLOVE BIO SURGEON STRL SZ7.5 (GLOVE) ×3 IMPLANT
GOWN STRL REUS W/ TWL LRG LVL3 (GOWN DISPOSABLE) ×2 IMPLANT
GOWN STRL REUS W/ TWL XL LVL3 (GOWN DISPOSABLE) ×1 IMPLANT
GOWN STRL REUS W/TWL LRG LVL3 (GOWN DISPOSABLE) ×4
GOWN STRL REUS W/TWL XL LVL3 (GOWN DISPOSABLE) ×2
INSERT FOGARTY SM (MISCELLANEOUS) IMPLANT
KIT BASIN OR (CUSTOM PROCEDURE TRAY) ×3 IMPLANT
KIT TURNOVER KIT B (KITS) ×3 IMPLANT
NS IRRIG 1000ML POUR BTL (IV SOLUTION) ×3 IMPLANT
PACK CV ACCESS (CUSTOM PROCEDURE TRAY) ×3 IMPLANT
PAD ARMBOARD 7.5X6 YLW CONV (MISCELLANEOUS) ×6 IMPLANT
SUT ETHILON 3 0 PS 1 (SUTURE) ×2 IMPLANT
SUT MNCRL AB 4-0 PS2 18 (SUTURE) ×5 IMPLANT
SUT PROLENE 6 0 BV (SUTURE) ×5 IMPLANT
SUT VIC AB 3-0 SH 27 (SUTURE) ×4
SUT VIC AB 3-0 SH 27X BRD (SUTURE) ×1 IMPLANT
TOWEL GREEN STERILE (TOWEL DISPOSABLE) ×3 IMPLANT
UNDERPAD 30X30 (UNDERPADS AND DIAPERS) ×3 IMPLANT
WATER STERILE IRR 1000ML POUR (IV SOLUTION) ×3 IMPLANT

## 2018-10-23 NOTE — Anesthesia Postprocedure Evaluation (Signed)
Anesthesia Post Note  Patient: Seth [name]  Procedure(s) Performed: ARTERIOVENOUS (AV) FISTULA CREATION (Left Arm Upper)     Patient location during evaluation: PACU Anesthesia Type: General Level of consciousness: awake and alert and oriented Pain management: pain level controlled Vital Signs Assessment: post-procedure vital signs reviewed and stable Respiratory status: spontaneous breathing, nonlabored ventilation and respiratory function stable Cardiovascular status: blood pressure returned to baseline and stable Postop Assessment: no apparent nausea or vomiting Anesthetic complications: no    Last Vitals:  Vitals:   10/23/18 0856 10/23/18 0901  BP: (!) 143/60   Pulse: 78 80  Resp: 17 (!) 23  Temp:    SpO2:  90%    Last Pain:  Vitals:   10/23/18 0842  TempSrc:   PainSc: 0-No pain                 [place] Luppino A.

## 2018-10-23 NOTE — Transfer of Care (Signed)
Immediate Anesthesia Transfer of Care Note  Patient: Seth [name]  Procedure(s) Performed: ARTERIOVENOUS (AV) FISTULA CREATION (Left Arm Upper)  Patient Location: PACU  Anesthesia Type:General  Level of Consciousness: awake, drowsy and patient cooperative  Airway & Oxygen Therapy: Patient Spontanous Breathing and Patient connected to face mask oxygen  Post-op Assessment: Report given to RN and Post -op Vital signs reviewed and stable  Post vital signs: Reviewed and stable  Last Vitals:  Vitals Value Taken Time  BP 166/59 10/23/2018  8:42 AM  Temp    Pulse 77 10/23/2018  8:43 AM  Resp 20 10/23/2018  8:43 AM  SpO2 98 % 10/23/2018  8:43 AM  Vitals shown include unvalidated device data.  Last Pain:  Vitals:   10/23/18 0653  TempSrc:   PainSc: 5       Patients Stated Pain Goal: 3 (67/25/50 0164)  Complications: No apparent anesthesia complications

## 2018-10-23 NOTE — H&P (Signed)
   History and Physical Update  The patient was interviewed and re-examined.  The patient's previous History and Physical has been reviewed and is unchanged from recent evaluation. Plan for left arm avf vs graft.   Verma Grothaus C. Donzetta Matters, MD [facility] Office: [phone] Pager: [phone]   10/23/2018, 7:24 AM
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
## 2018-10-23 NOTE — Anesthesia Procedure Notes (Signed)
Procedure Name: LMA Insertion Date/Time: 10/23/2018 7:38 AM Performed by: Renato Shin, CRNA Pre-anesthesia Checklist: Patient identified, Emergency Drugs available, Suction available and Patient being monitored Patient Re-evaluated:Patient Re-evaluated prior to induction Oxygen Delivery Method: Circle system utilized Preoxygenation: Pre-oxygenation with 100% oxygen Induction Type: IV induction LMA: LMA inserted LMA Size: 5.0 Number of attempts: 2 Placement Confirmation: positive ETCO2 and breath sounds checked- equal and bilateral Tube secured with: Tape Dental Injury: Teeth and Oropharynx as per pre-operative assessment

## 2018-10-23 NOTE — Discharge Instructions (Signed)
° °  [facility]  Discharge Instructions  AV Fistula or Graft Surgery for Dialysis Access  Please refer to the following instructions for your post-procedure care. Your surgeon or physician assistant will discuss any changes with you.  Activity  You may drive the day following your surgery, if you are comfortable and no longer taking prescription pain medication. Resume full activity as the soreness in your incision resolves.  Bathing/Showering  You may shower after you go home. Keep your incision dry for 48 hours. Do not soak in a bathtub, hot tub, or swim until the incision heals completely. You may not shower if you have a hemodialysis catheter.  Incision Care  Clean your incision with mild soap and water after 48 hours. Pat the area dry with a clean towel. You do not need a bandage unless otherwise instructed. Do not apply any ointments or creams to your incision. You may have skin glue on your incision. Do not peel it off. It will come off on its own in about one week. Your arm may swell a bit after surgery. To reduce swelling use pillows to elevate your arm so it is above your heart. Your doctor will tell you if you need to lightly wrap your arm with an ACE bandage.  Diet  Resume your normal diet. There are not special food restrictions following this procedure. In order to heal from your surgery, it is CRITICAL to get adequate nutrition. Your body requires vitamins, minerals, and protein. Vegetables are the best source of vitamins and minerals. Vegetables also provide the perfect balance of protein. Processed food has little nutritional value, so try to avoid this.  Medications  Resume taking all of your medications. If your incision is causing pain, you may take over-the counter pain relievers such as acetaminophen (Tylenol). If you were prescribed a stronger pain medication, please be aware these medications can cause nausea and constipation. Prevent  nausea by taking the medication with a snack or meal. Avoid constipation by drinking plenty of fluids and eating foods with high amount of fiber, such as fruits, vegetables, and grains.  Do not take Tylenol if you are taking prescription pain medications.  Follow up Your surgeon may want to see you in the office following your access surgery. If so, this will be arranged at the time of your surgery.  Please call us immediately for any of the following conditions:  Increased pain, redness, drainage (pus) from your incision site Fever of 101 degrees or higher Severe or worsening pain at your incision site Hand pain or numbness.  Reduce your risk of vascular disease:  Stop smoking. If you would like help, call [facility] at 1-800-QUIT-NOW 956-720-9175) or [place] at [place] your cholesterol Maintain a desired weight Control your diabetes Keep your blood pressure down  Dialysis  It will take several weeks to several months for your new dialysis access to be ready for use. Your surgeon will determine when it is okay to use it. Your nephrologist will continue to direct your dialysis. You can continue to use your Permcath until your new access is ready for use.   10/23/2018 KHOURY SIEMON [id number] 27-Jun-1966  Surgeon(s): Waynetta Sandy, MD  Procedure(s): Creation of 1st stage basilic vein transposition  x Do not stick fistula for 12 weeks    If you have any questions, please call the office at [phone].

## 2018-10-23 NOTE — Progress Notes (Signed)
Dr. Royce Macadamia made aware of glucose from [facility]--orders for insulin given.

## 2018-10-24 ENCOUNTER — Telehealth: Payer: Self-pay | Admitting: Vascular Surgery

## 2018-10-24 ENCOUNTER — Encounter (HOSPITAL_COMMUNITY): Payer: Self-pay | Admitting: Vascular Surgery

## 2018-10-24 NOTE — Telephone Encounter (Signed)
-----   Message from Gabriel Earing, [place] sent at 10/23/2018  8:33 AM EST ----- S/p 1st stage left BVT 10/23/2018.  F/u with Dr. Donzetta Matters in 4-6 weeks with duplex to discuss 2nd stage BVT.  Thanks

## 2018-10-24 NOTE — Telephone Encounter (Signed)
sch appt spk to pt mld ltr 11/30/2018 11am Dialysis duplex 1130am p/o MD

## 2018-11-01 ENCOUNTER — Other Ambulatory Visit: Payer: Self-pay

## 2018-11-01 DIAGNOSIS — N186 End stage renal disease: Secondary | ICD-10-CM

## 2018-11-01 DIAGNOSIS — Z992 Dependence on renal dialysis: Principal | ICD-10-CM

## 2018-11-09 DIAGNOSIS — Z992 Dependence on renal dialysis: Secondary | ICD-10-CM | POA: Diagnosis not present

## 2018-11-09 DIAGNOSIS — N186 End stage renal disease: Secondary | ICD-10-CM | POA: Diagnosis not present

## 2018-11-20 ENCOUNTER — Encounter: Payer: Self-pay | Admitting: Internal Medicine

## 2018-11-20 ENCOUNTER — Ambulatory Visit (INDEPENDENT_AMBULATORY_CARE_PROVIDER_SITE_OTHER): Payer: Medicare Other | Admitting: Internal Medicine

## 2018-11-20 ENCOUNTER — Other Ambulatory Visit: Payer: Self-pay

## 2018-11-20 VITALS — BP 162/105 | HR 88 | Temp 97.6°F | Ht 67.0 in | Wt 321.6 lb

## 2018-11-20 DIAGNOSIS — Z9989 Dependence on other enabling machines and devices: Secondary | ICD-10-CM | POA: Diagnosis not present

## 2018-11-20 DIAGNOSIS — G8929 Other chronic pain: Secondary | ICD-10-CM

## 2018-11-20 DIAGNOSIS — M4640 Discitis, unspecified, site unspecified: Secondary | ICD-10-CM

## 2018-11-20 DIAGNOSIS — G9520 Unspecified cord compression: Secondary | ICD-10-CM | POA: Diagnosis not present

## 2018-11-20 DIAGNOSIS — N186 End stage renal disease: Secondary | ICD-10-CM

## 2018-11-20 DIAGNOSIS — Z6841 Body Mass Index (BMI) 40.0 and over, adult: Secondary | ICD-10-CM | POA: Diagnosis not present

## 2018-11-20 DIAGNOSIS — E1122 Type 2 diabetes mellitus with diabetic chronic kidney disease: Secondary | ICD-10-CM

## 2018-11-20 DIAGNOSIS — I10 Essential (primary) hypertension: Secondary | ICD-10-CM

## 2018-11-20 DIAGNOSIS — E1159 Type 2 diabetes mellitus with other circulatory complications: Secondary | ICD-10-CM

## 2018-11-20 DIAGNOSIS — I120 Hypertensive chronic kidney disease with stage 5 chronic kidney disease or end stage renal disease: Secondary | ICD-10-CM | POA: Diagnosis not present

## 2018-11-20 DIAGNOSIS — Z992 Dependence on renal dialysis: Secondary | ICD-10-CM

## 2018-11-20 DIAGNOSIS — G4733 Obstructive sleep apnea (adult) (pediatric): Secondary | ICD-10-CM | POA: Diagnosis not present

## 2018-11-20 DIAGNOSIS — M4620 Osteomyelitis of vertebra, site unspecified: Secondary | ICD-10-CM

## 2018-11-20 DIAGNOSIS — M25561 Pain in right knee: Secondary | ICD-10-CM

## 2018-11-20 DIAGNOSIS — Z79899 Other long term (current) drug therapy: Secondary | ICD-10-CM | POA: Diagnosis not present

## 2018-11-20 DIAGNOSIS — Z79891 Long term (current) use of opiate analgesic: Secondary | ICD-10-CM | POA: Diagnosis not present

## 2018-11-20 DIAGNOSIS — Z981 Arthrodesis status: Secondary | ICD-10-CM | POA: Diagnosis not present

## 2018-11-20 MED ORDER — OXYCODONE-ACETAMINOPHEN 10-325 MG PO TABS: 1.0000 | ORAL_TABLET | Freq: Three times a day (TID) | ORAL | 0 refills | Status: DC | PRN

## 2018-11-20 NOTE — Assessment & Plan Note (Signed)
Assessment: Mr. Steele back and knee pain is a 10 out of 10 today.  He states that he ran out of his Oxycodone-acetaminophen 10-325 mg yesterday.  He does believe that his back and knee pain was well controlled on the 3 times daily dosing.  He denies oversedation or other side effects while on this medication.  Pain contract was established on 06-21-2018.  Narcotics database reviewed and patient's filling patterns are within normal limits.  I do believe that it is appropriate to refill his oxycodone-acetaminophen prescription today as benefits outweigh risks.  Plan: 1.  Refilled oxycodone- acetaminophen 10-325 mg q8h PRN pain 2.  Ordered blood tox screen.  Patient is has end-stage renal disease and does not make urine for utox.
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
## 2018-11-20 NOTE — Assessment & Plan Note (Signed)
Assessment: He is currently taking amlodipine 10 mg daily but did not take this medication today.  Per chart review hydralazine 25 mg twice daily was added in June 2019 but he has not taken this medication in over several months.  We will plan on repeating his blood pressure at next clinic visit while on amlodipine.  Advised him to take this medication every day including his clinic today.  He expressed understanding and agreement.  Plan: 1.  Continue amlodipine 10 mg daily 2.  Repeat blood pressure at next clinic visit with likely addition of another antihypertensive if blood pressures continue to remain elevated.

## 2018-11-21 NOTE — Progress Notes (Signed)
Case discussed with Dr. Alfonse Spruce at the time of the visit.  We reviewed the resident's history and exam and pertinent patient test results.  I agree with the assessment, diagnosis and plan of care documented in the resident's note.  The decision today was to increase the monthly oxycodone-acetaminophen to #90/month.  We will likely need to add an antihypertensive medication to the amlodipine at the follow-up visit.

## 2018-11-28 ENCOUNTER — Inpatient Hospital Stay (HOSPITAL_COMMUNITY)
Admission: EM | Admit: 2018-11-28 | Discharge: 2018-12-08 | DRG: 280 | Disposition: A | Discharge status: Home Health | Payer: Medicare Other | Attending: Internal Medicine | Admitting: Internal Medicine

## 2018-11-28 ENCOUNTER — Encounter: Payer: Self-pay | Admitting: Internal Medicine

## 2018-11-28 ENCOUNTER — Other Ambulatory Visit: Payer: Self-pay

## 2018-11-28 ENCOUNTER — Emergency Department (HOSPITAL_COMMUNITY): Payer: Medicare Other

## 2018-11-28 ENCOUNTER — Encounter (HOSPITAL_COMMUNITY): Payer: Self-pay | Admitting: Emergency Medicine

## 2018-11-28 DIAGNOSIS — I952 Hypotension due to drugs: Secondary | ICD-10-CM | POA: Diagnosis not present

## 2018-11-28 DIAGNOSIS — B9561 Methicillin susceptible Staphylococcus aureus infection as the cause of diseases classified elsewhere: Secondary | ICD-10-CM | POA: Diagnosis present

## 2018-11-28 DIAGNOSIS — R0902 Hypoxemia: Secondary | ICD-10-CM

## 2018-11-28 DIAGNOSIS — Y92230 Patient room in hospital as the place of occurrence of the external cause: Secondary | ICD-10-CM | POA: Diagnosis not present

## 2018-11-28 DIAGNOSIS — I252 Old myocardial infarction: Secondary | ICD-10-CM

## 2018-11-28 DIAGNOSIS — Z992 Dependence on renal dialysis: Secondary | ICD-10-CM

## 2018-11-28 DIAGNOSIS — J385 Laryngeal spasm: Secondary | ICD-10-CM | POA: Diagnosis not present

## 2018-11-28 DIAGNOSIS — R7881 Bacteremia: Secondary | ICD-10-CM | POA: Diagnosis present

## 2018-11-28 DIAGNOSIS — G8929 Other chronic pain: Secondary | ICD-10-CM | POA: Diagnosis not present

## 2018-11-28 DIAGNOSIS — Z6841 Body Mass Index (BMI) 40.0 and over, adult: Secondary | ICD-10-CM | POA: Diagnosis not present

## 2018-11-28 DIAGNOSIS — R7989 Other specified abnormal findings of blood chemistry: Secondary | ICD-10-CM

## 2018-11-28 DIAGNOSIS — M25561 Pain in right knee: Secondary | ICD-10-CM | POA: Diagnosis not present

## 2018-11-28 DIAGNOSIS — Z01810 Encounter for preprocedural cardiovascular examination: Secondary | ICD-10-CM | POA: Diagnosis not present

## 2018-11-28 DIAGNOSIS — G4733 Obstructive sleep apnea (adult) (pediatric): Secondary | ICD-10-CM | POA: Diagnosis not present

## 2018-11-28 DIAGNOSIS — E785 Hyperlipidemia, unspecified: Secondary | ICD-10-CM | POA: Diagnosis not present

## 2018-11-28 DIAGNOSIS — R0602 Shortness of breath: Secondary | ICD-10-CM | POA: Diagnosis not present

## 2018-11-28 DIAGNOSIS — J9602 Acute respiratory failure with hypercapnia: Secondary | ICD-10-CM | POA: Diagnosis not present

## 2018-11-28 DIAGNOSIS — E1165 Type 2 diabetes mellitus with hyperglycemia: Secondary | ICD-10-CM | POA: Diagnosis not present

## 2018-11-28 DIAGNOSIS — Z794 Long term (current) use of insulin: Secondary | ICD-10-CM | POA: Diagnosis not present

## 2018-11-28 DIAGNOSIS — E1169 Type 2 diabetes mellitus with other specified complication: Secondary | ICD-10-CM | POA: Diagnosis present

## 2018-11-28 DIAGNOSIS — N186 End stage renal disease: Secondary | ICD-10-CM | POA: Diagnosis present

## 2018-11-28 DIAGNOSIS — I132 Hypertensive heart and chronic kidney disease with heart failure and with stage 5 chronic kidney disease, or end stage renal disease: Secondary | ICD-10-CM | POA: Diagnosis present

## 2018-11-28 DIAGNOSIS — T424X5A Adverse effect of benzodiazepines, initial encounter: Secondary | ICD-10-CM | POA: Diagnosis not present

## 2018-11-28 DIAGNOSIS — E1122 Type 2 diabetes mellitus with diabetic chronic kidney disease: Secondary | ICD-10-CM

## 2018-11-28 DIAGNOSIS — T827XXA Infection and inflammatory reaction due to other cardiac and vascular devices, implants and grafts, initial encounter: Secondary | ICD-10-CM | POA: Diagnosis present

## 2018-11-28 DIAGNOSIS — E8770 Fluid overload, unspecified: Secondary | ICD-10-CM | POA: Diagnosis present

## 2018-11-28 DIAGNOSIS — E874 Mixed disorder of acid-base balance: Secondary | ICD-10-CM | POA: Diagnosis not present

## 2018-11-28 DIAGNOSIS — I152 Hypertension secondary to endocrine disorders: Secondary | ICD-10-CM | POA: Diagnosis present

## 2018-11-28 DIAGNOSIS — F419 Anxiety disorder, unspecified: Secondary | ICD-10-CM | POA: Diagnosis present

## 2018-11-28 DIAGNOSIS — M25461 Effusion, right knee: Secondary | ICD-10-CM | POA: Diagnosis not present

## 2018-11-28 DIAGNOSIS — M4620 Osteomyelitis of vertebra, site unspecified: Secondary | ICD-10-CM | POA: Diagnosis not present

## 2018-11-28 DIAGNOSIS — R079 Chest pain, unspecified: Secondary | ICD-10-CM | POA: Diagnosis not present

## 2018-11-28 DIAGNOSIS — Z79891 Long term (current) use of opiate analgesic: Secondary | ICD-10-CM | POA: Diagnosis not present

## 2018-11-28 DIAGNOSIS — R05 Cough: Secondary | ICD-10-CM | POA: Diagnosis not present

## 2018-11-28 DIAGNOSIS — Z9119 Patient's noncompliance with other medical treatment and regimen: Secondary | ICD-10-CM | POA: Diagnosis not present

## 2018-11-28 DIAGNOSIS — R739 Hyperglycemia, unspecified: Secondary | ICD-10-CM

## 2018-11-28 DIAGNOSIS — E875 Hyperkalemia: Secondary | ICD-10-CM | POA: Diagnosis not present

## 2018-11-28 DIAGNOSIS — I5083 High output heart failure: Secondary | ICD-10-CM | POA: Diagnosis present

## 2018-11-28 DIAGNOSIS — Z833 Family history of diabetes mellitus: Secondary | ICD-10-CM

## 2018-11-28 DIAGNOSIS — Z79899 Other long term (current) drug therapy: Secondary | ICD-10-CM

## 2018-11-28 DIAGNOSIS — E1159 Type 2 diabetes mellitus with other circulatory complications: Secondary | ICD-10-CM | POA: Diagnosis present

## 2018-11-28 DIAGNOSIS — E1110 Type 2 diabetes mellitus with ketoacidosis without coma: Secondary | ICD-10-CM

## 2018-11-28 DIAGNOSIS — E662 Morbid (severe) obesity with alveolar hypoventilation: Secondary | ICD-10-CM | POA: Diagnosis present

## 2018-11-28 DIAGNOSIS — Z8619 Personal history of other infectious and parasitic diseases: Secondary | ICD-10-CM | POA: Diagnosis not present

## 2018-11-28 DIAGNOSIS — I959 Hypotension, unspecified: Secondary | ICD-10-CM | POA: Diagnosis not present

## 2018-11-28 DIAGNOSIS — M549 Dorsalgia, unspecified: Secondary | ICD-10-CM | POA: Diagnosis not present

## 2018-11-28 DIAGNOSIS — R0689 Other abnormalities of breathing: Secondary | ICD-10-CM | POA: Diagnosis not present

## 2018-11-28 DIAGNOSIS — I4510 Unspecified right bundle-branch block: Secondary | ICD-10-CM | POA: Diagnosis not present

## 2018-11-28 DIAGNOSIS — Z452 Encounter for adjustment and management of vascular access device: Secondary | ICD-10-CM | POA: Diagnosis not present

## 2018-11-28 DIAGNOSIS — M4644 Discitis, unspecified, thoracic region: Secondary | ICD-10-CM | POA: Diagnosis not present

## 2018-11-28 DIAGNOSIS — N185 Chronic kidney disease, stage 5: Secondary | ICD-10-CM | POA: Diagnosis not present

## 2018-11-28 DIAGNOSIS — Z23 Encounter for immunization: Secondary | ICD-10-CM

## 2018-11-28 DIAGNOSIS — R0989 Other specified symptoms and signs involving the circulatory and respiratory systems: Secondary | ICD-10-CM | POA: Diagnosis not present

## 2018-11-28 DIAGNOSIS — J9601 Acute respiratory failure with hypoxia: Secondary | ICD-10-CM | POA: Diagnosis not present

## 2018-11-28 DIAGNOSIS — Z7982 Long term (current) use of aspirin: Secondary | ICD-10-CM

## 2018-11-28 DIAGNOSIS — R059 Cough, unspecified: Secondary | ICD-10-CM

## 2018-11-28 DIAGNOSIS — R778 Other specified abnormalities of plasma proteins: Secondary | ICD-10-CM

## 2018-11-28 DIAGNOSIS — M4807 Spinal stenosis, lumbosacral region: Secondary | ICD-10-CM | POA: Diagnosis not present

## 2018-11-28 DIAGNOSIS — B9562 Methicillin resistant Staphylococcus aureus infection as the cause of diseases classified elsewhere: Secondary | ICD-10-CM | POA: Diagnosis present

## 2018-11-28 DIAGNOSIS — Z9989 Dependence on other enabling machines and devices: Secondary | ICD-10-CM | POA: Diagnosis not present

## 2018-11-28 DIAGNOSIS — I214 Non-ST elevation (NSTEMI) myocardial infarction: Secondary | ICD-10-CM | POA: Diagnosis present

## 2018-11-28 DIAGNOSIS — I10 Essential (primary) hypertension: Secondary | ICD-10-CM

## 2018-11-28 DIAGNOSIS — Z9115 Patient's noncompliance with renal dialysis: Secondary | ICD-10-CM

## 2018-11-28 DIAGNOSIS — I120 Hypertensive chronic kidney disease with stage 5 chronic kidney disease or end stage renal disease: Secondary | ICD-10-CM | POA: Diagnosis not present

## 2018-11-28 DIAGNOSIS — E871 Hypo-osmolality and hyponatremia: Secondary | ICD-10-CM | POA: Diagnosis present

## 2018-11-28 DIAGNOSIS — E861 Hypovolemia: Secondary | ICD-10-CM | POA: Diagnosis not present

## 2018-11-28 LAB — RESPIRATORY PANEL BY PCR

## 2018-11-28 LAB — CBC
HCT: 36.5 % — ABNORMAL LOW (ref 39.0–52.0)
Hemoglobin: 11.3 g/dL — ABNORMAL LOW (ref 13.0–17.0)
MCH: 30 pg (ref 26.0–34.0)
MCHC: 31 g/dL (ref 30.0–36.0)
MCV: 96.8 fL (ref 80.0–100.0)
Platelets: 173 10*3/uL (ref 150–400)
RBC: 3.77 MIL/uL — ABNORMAL LOW (ref 4.22–5.81)
RDW: 14.3 % (ref 11.5–15.5)
WBC: 16.8 10*3/uL — ABNORMAL HIGH (ref 4.0–10.5)
nRBC: 0 % (ref 0.0–0.2)

## 2018-11-28 LAB — GLUCOSE, CAPILLARY
Glucose-Capillary: 153 mg/dL — ABNORMAL HIGH (ref 70–99)
Glucose-Capillary: 147 mg/dL — ABNORMAL HIGH (ref 70–99)
Glucose-Capillary: 134 mg/dL — ABNORMAL HIGH (ref 70–99)
Glucose-Capillary: 137 mg/dL — ABNORMAL HIGH (ref 70–99)
Glucose-Capillary: 136 mg/dL — ABNORMAL HIGH (ref 70–99)
Glucose-Capillary: 138 mg/dL — ABNORMAL HIGH (ref 70–99)

## 2018-11-28 LAB — BASIC METABOLIC PANEL
Anion gap: 18 — ABNORMAL HIGH (ref 5–15)
Anion gap: 16 — ABNORMAL HIGH (ref 5–15)
BUN: 49 mg/dL — ABNORMAL HIGH (ref 6–20)
BUN: 51 mg/dL — ABNORMAL HIGH (ref 6–20)
CO2: 22 mmol/L (ref 22–32)
CO2: 25 mmol/L (ref 22–32)
Calcium: 8.5 mg/dL — ABNORMAL LOW (ref 8.9–10.3)
Calcium: 8.2 mg/dL — ABNORMAL LOW (ref 8.9–10.3)
Chloride: 91 mmol/L — ABNORMAL LOW (ref 98–111)
Chloride: 90 mmol/L — ABNORMAL LOW (ref 98–111)
Creatinine, Ser: 9.59 mg/dL — ABNORMAL HIGH (ref 0.61–1.24)
Creatinine, Ser: 9.93 mg/dL — ABNORMAL HIGH (ref 0.61–1.24)
GFR calc Af Amer: 6 mL/min — ABNORMAL LOW (ref >60)
GFR calc Af Amer: 6 mL/min — ABNORMAL LOW (ref >60)
GFR calc non Af Amer: 6 mL/min — ABNORMAL LOW (ref >60)
GFR calc non Af Amer: 5 mL/min — ABNORMAL LOW (ref >60)
Glucose, Bld: 150 mg/dL — ABNORMAL HIGH (ref 70–99)
Glucose, Bld: 149 mg/dL — ABNORMAL HIGH (ref 70–99)
Potassium: 5.3 mmol/L — ABNORMAL HIGH (ref 3.5–5.1)
Potassium: 5.4 mmol/L — ABNORMAL HIGH (ref 3.5–5.1)
Sodium: 131 mmol/L — ABNORMAL LOW (ref 135–145)
Sodium: 131 mmol/L — ABNORMAL LOW (ref 135–145)

## 2018-11-28 LAB — CBC WITH DIFFERENTIAL/PLATELET
Abs Immature Granulocytes: 0.24 10*3/uL — ABNORMAL HIGH (ref 0.00–0.07)
Basophils Absolute: 0.1 10*3/uL (ref 0.0–0.1)
Basophils Relative: 0 %
Eosinophils Absolute: 0 10*3/uL (ref 0.0–0.5)
Eosinophils Relative: 0 %
HCT: 35.2 % — ABNORMAL LOW (ref 39.0–52.0)
Hemoglobin: 10.8 g/dL — ABNORMAL LOW (ref 13.0–17.0)
Immature Granulocytes: 1 %
Lymphocytes Relative: 5 %
Lymphs Abs: 0.9 10*3/uL (ref 0.7–4.0)
MCH: 29.8 pg (ref 26.0–34.0)
MCHC: 30.7 g/dL (ref 30.0–36.0)
MCV: 97 fL (ref 80.0–100.0)
Monocytes Absolute: 1.3 10*3/uL — ABNORMAL HIGH (ref 0.1–1.0)
Monocytes Relative: 8 %
Neutro Abs: 15 10*3/uL — ABNORMAL HIGH (ref 1.7–7.7)
Neutrophils Relative %: 86 %
Platelets: 175 10*3/uL (ref 150–400)
RBC: 3.63 MIL/uL — ABNORMAL LOW (ref 4.22–5.81)
RDW: 14.2 % (ref 11.5–15.5)
WBC: 17.4 10*3/uL — ABNORMAL HIGH (ref 4.0–10.5)
nRBC: 0 % (ref 0.0–0.2)

## 2018-11-28 LAB — TYPE AND SCREEN
ABO/RH(D): B POS
Antibody Screen: NEGATIVE

## 2018-11-28 LAB — COMPREHENSIVE METABOLIC PANEL
ALT: 78 U/L — ABNORMAL HIGH (ref 0–44)
AST: 142 U/L — ABNORMAL HIGH (ref 15–41)
Albumin: 3.1 g/dL — ABNORMAL LOW (ref 3.5–5.0)
Alkaline Phosphatase: 91 U/L (ref 38–126)
Anion gap: 22 — ABNORMAL HIGH (ref 5–15)
BUN: 90 mg/dL — ABNORMAL HIGH (ref 6–20)
CO2: 17 mmol/L — ABNORMAL LOW (ref 22–32)
Calcium: 8.5 mg/dL — ABNORMAL LOW (ref 8.9–10.3)
Chloride: 85 mmol/L — ABNORMAL LOW (ref 98–111)
Creatinine, Ser: 13.88 mg/dL — ABNORMAL HIGH (ref 0.61–1.24)
GFR calc Af Amer: 4 mL/min — ABNORMAL LOW (ref >60)
GFR, EST NON AFRICAN AMERICAN: 4 mL/min — AB (ref >60)
Glucose, Bld: 298 mg/dL — ABNORMAL HIGH (ref 70–99)
Potassium: 7.1 mmol/L (ref 3.5–5.1)
Sodium: 124 mmol/L — ABNORMAL LOW (ref 135–145)
Total Bilirubin: 1.7 mg/dL — ABNORMAL HIGH (ref 0.3–1.2)
Total Protein: 8.2 g/dL — ABNORMAL HIGH (ref 6.5–8.1)

## 2018-11-28 LAB — BLOOD GAS, VENOUS
Acid-Base Excess: 0.5 mmol/L (ref 0.0–2.0)
Bicarbonate: 26.7 mmol/L (ref 20.0–28.0)
O2 Saturation: 57.6 %
Patient temperature: 98.6
pCO2, Ven: 60.9 mmHg — ABNORMAL HIGH (ref 44.0–60.0)
pH, Ven: 7.265 (ref 7.250–7.430)
pO2, Ven: 34.8 mmHg (ref 32.0–45.0)

## 2018-11-28 LAB — POCT I-STAT EG7
Acid-base deficit: 5 mmol/L — ABNORMAL HIGH (ref 0.0–2.0)
Bicarbonate: 24.4 mmol/L (ref 20.0–28.0)
CALCIUM ION: 1.08 mmol/L — AB (ref 1.15–1.40)
HCT: 38 % — ABNORMAL LOW (ref 39.0–52.0)
Hemoglobin: 12.9 g/dL — ABNORMAL LOW (ref 13.0–17.0)
O2 SAT: 89 %
Potassium: 6.9 mmol/L (ref 3.5–5.1)
Sodium: 122 mmol/L — ABNORMAL LOW (ref 135–145)
TCO2: 26 mmol/L (ref 22–32)
pCO2, Ven: 64.2 mmHg — ABNORMAL HIGH (ref 44.0–60.0)
pH, Ven: 7.187 — CL (ref 7.250–7.430)
pO2, Ven: 73 mmHg — ABNORMAL HIGH (ref 32.0–45.0)

## 2018-11-28 LAB — LIPASE, BLOOD: Lipase: 22 U/L (ref 11–51)

## 2018-11-28 LAB — BETA-HYDROXYBUTYRIC ACID: Beta-Hydroxybutyric Acid: 0.66 mmol/L — ABNORMAL HIGH (ref 0.05–0.27)

## 2018-11-28 LAB — I-STAT TROPONIN, ED
TROPONIN I, POC: 0.3 ng/mL — AB (ref 0.00–0.08)
Troponin i, poc: 0.29 ng/mL (ref 0.00–0.08)

## 2018-11-28 LAB — TSH: TSH: 1.582 u[IU]/mL (ref 0.350–4.500)

## 2018-11-28 LAB — INFLUENZA PANEL BY PCR (TYPE A & B)
Influenza A By PCR: NEGATIVE
Influenza B By PCR: NEGATIVE

## 2018-11-28 LAB — CBG MONITORING, ED: Glucose-Capillary: 232 mg/dL — ABNORMAL HIGH (ref 70–99)

## 2018-11-28 LAB — TROPONIN I
Troponin I: 0.62 ng/mL (ref <0.03)
Troponin I: 0.75 ng/mL (ref <0.03)

## 2018-11-28 LAB — LACTIC ACID, PLASMA: Lactic Acid, Venous: 1.8 mmol/L (ref 0.5–1.9)

## 2018-11-28 LAB — MRSA PCR SCREENING: MRSA by PCR: NEGATIVE

## 2018-11-28 LAB — ETHANOL: Alcohol, Ethyl (B): <10 mg/dL (ref <10)

## 2018-11-28 LAB — POTASSIUM: Potassium: 6.5 mmol/L (ref 3.5–5.1)

## 2018-11-28 MED ORDER — FERRIC CITRATE 1 GM 210 MG(FE) PO TABS
630.0000 mg | ORAL_TABLET | Freq: Three times a day (TID) | ORAL | Status: DC
  Administered 2018-11-29 – 2018-12-08 (×19): 630 mg via ORAL
  Filled 2018-11-28 (×28): qty 3

## 2018-11-28 MED ORDER — INSULIN ASPART 100 UNIT/ML IV SOLN
5.0000 [IU] | Freq: Once | INTRAVENOUS | Status: AC
  Administered 2018-11-28: 5 [IU] via INTRAVENOUS

## 2018-11-28 MED ORDER — DEXTROSE-NACL 5-0.45 % IV SOLN
INTRAVENOUS | Status: DC
  Administered 2018-11-28 (×2): via INTRAVENOUS

## 2018-11-28 MED ORDER — OXYCODONE-ACETAMINOPHEN 5-325 MG PO TABS
1.0000 | ORAL_TABLET | Freq: Three times a day (TID) | ORAL | Status: DC | PRN
  Administered 2018-12-01 – 2018-12-08 (×5): 1 via ORAL
  Filled 2018-11-28 (×5): qty 1

## 2018-11-28 MED ORDER — ASPIRIN EC 81 MG PO TBEC
81.0000 mg | DELAYED_RELEASE_TABLET | Freq: Every day | ORAL | Status: DC
  Administered 2018-11-29 – 2018-12-08 (×9): 81 mg via ORAL
  Filled 2018-11-28 (×9): qty 1

## 2018-11-28 MED ORDER — HEPARIN SODIUM (PORCINE) 5000 UNIT/ML IJ SOLN
5000.0000 [IU] | Freq: Three times a day (TID) | INTRAMUSCULAR | Status: DC
  Administered 2018-11-28 – 2018-12-08 (×26): 5000 [IU] via SUBCUTANEOUS
  Filled 2018-11-28 (×26): qty 1

## 2018-11-28 MED ORDER — ALBUTEROL (5 MG/ML) CONTINUOUS INHALATION SOLN
10.0000 mg | INHALATION_SOLUTION | RESPIRATORY_TRACT | Status: DC
  Administered 2018-11-28: 10 mg via RESPIRATORY_TRACT
  Filled 2018-11-28: qty 20

## 2018-11-28 MED ORDER — DEXTROSE 5 % IV SOLN
250.0000 mg | Freq: Once | INTRAVENOUS | Status: AC
  Administered 2018-11-28: 250 mg via INTRAVENOUS
  Filled 2018-11-28: qty 250

## 2018-11-28 MED ORDER — ORAL CARE MOUTH RINSE
15.0000 mL | Freq: Two times a day (BID) | OROMUCOSAL | Status: DC
  Administered 2018-11-29 – 2018-12-06 (×11): 15 mL via OROMUCOSAL

## 2018-11-28 MED ORDER — SODIUM CHLORIDE 0.9 % IV SOLN: 500.0000 mg | INTRAVENOUS | Status: DC

## 2018-11-28 MED ORDER — ATORVASTATIN CALCIUM 40 MG PO TABS
40.0000 mg | ORAL_TABLET | Freq: Every day | ORAL | Status: DC
  Administered 2018-11-29 – 2018-12-07 (×9): 40 mg via ORAL
  Filled 2018-11-28 (×9): qty 1

## 2018-11-28 MED ORDER — HEPARIN SODIUM (PORCINE) 1000 UNIT/ML DIALYSIS
4000.0000 [IU] | INTRAMUSCULAR | Status: DC | PRN
  Filled 2018-11-28: qty 4

## 2018-11-28 MED ORDER — FERRIC CITRATE 1 GM 210 MG(FE) PO TABS
420.0000 mg | ORAL_TABLET | Freq: Three times a day (TID) | ORAL | Status: DC | PRN
  Filled 2018-11-28: qty 2

## 2018-11-28 MED ORDER — OXYCODONE HCL 5 MG PO TABS
5.0000 mg | ORAL_TABLET | Freq: Three times a day (TID) | ORAL | Status: DC | PRN
  Administered 2018-11-29 – 2018-12-08 (×8): 5 mg via ORAL
  Filled 2018-11-28 (×8): qty 1

## 2018-11-28 MED ORDER — SODIUM CHLORIDE 0.9 % IV SOLN
1.0000 g | INTRAVENOUS | Status: DC
  Administered 2018-11-28: 1 g via INTRAVENOUS
  Filled 2018-11-28: qty 10

## 2018-11-28 MED ORDER — INSULIN ASPART 100 UNIT/ML ~~LOC~~ SOLN: 0.0000 [IU] | Freq: Three times a day (TID) | SUBCUTANEOUS | Status: DC

## 2018-11-28 MED ORDER — SODIUM CHLORIDE 0.9 % IV SOLN
1.0000 g | Freq: Once | INTRAVENOUS | Status: DC
  Filled 2018-11-28 (×2): qty 10

## 2018-11-28 MED ORDER — SODIUM CHLORIDE 0.9 % IV SOLN: INTRAVENOUS | Status: DC

## 2018-11-28 MED ORDER — CHLORHEXIDINE GLUCONATE 0.12 % MT SOLN
15.0000 mL | Freq: Two times a day (BID) | OROMUCOSAL | Status: DC
  Administered 2018-11-28 – 2018-12-08 (×18): 15 mL via OROMUCOSAL
  Filled 2018-11-28 (×18): qty 15

## 2018-11-28 MED ORDER — INSULIN REGULAR(HUMAN) IN NACL 100-0.9 UT/100ML-% IV SOLN
INTRAVENOUS | Status: DC
  Administered 2018-11-28: 0.9 [IU]/h via INTRAVENOUS
  Administered 2018-11-29: 0.5 [IU]/h via INTRAVENOUS
  Filled 2018-11-28 (×2): qty 100

## 2018-11-28 MED ORDER — PNEUMOCOCCAL VAC POLYVALENT 25 MCG/0.5ML IJ INJ
0.5000 mL | INJECTION | INTRAMUSCULAR | Status: AC
  Administered 2018-11-29: 0.5 mL via INTRAMUSCULAR
  Filled 2018-11-28: qty 0.5

## 2018-11-28 MED ORDER — ALBUTEROL SULFATE (2.5 MG/3ML) 0.083% IN NEBU
10.0000 mg | INHALATION_SOLUTION | Freq: Once | RESPIRATORY_TRACT | Status: DC
  Filled 2018-11-28: qty 12

## 2018-11-28 MED ORDER — CHLORHEXIDINE GLUCONATE CLOTH 2 % EX PADS
6.0000 | MEDICATED_PAD | Freq: Every day | CUTANEOUS | Status: DC
  Administered 2018-11-28 – 2018-11-29 (×2): 6 via TOPICAL

## 2018-11-28 MED ORDER — OXYCODONE-ACETAMINOPHEN 10-325 MG PO TABS: 1.0000 | ORAL_TABLET | Freq: Three times a day (TID) | ORAL | Status: DC | PRN

## 2018-11-28 NOTE — ED Notes (Signed)
Patient transported to X-ray 
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
## 2018-11-28 NOTE — Progress Notes (Signed)
RN called d/t cpap order.  Came to room to s/u cpap.  Noted pt w/ OSA.  Pt does wake up and answer questions, but falls back asleep.  VSS currently, no distress noted, but apnea noted once pt falls back asleep.   Placed pt on auto-titrate mode d/t pt unsure of cpap settings, 6 lpm bled in (found pt on 5 lpm [place]).  RN paged MD to request bipap order.  Waiting on MD order.  VSS currently.

## 2018-11-28 NOTE — ED Triage Notes (Signed)
Pt arrives via EMS from home with reports of weakness x2 days. MWF HD pt, pt states he missed Monday but wife reports he went to HD. Pt reports hx of low hgb and thinks its the reason he is weak. Endorses cold for multiple days. Both arms restricted due to HD.

## 2018-11-28 NOTE — Progress Notes (Signed)
Switched pt out from nasal dream station BIPAP to V60 full face mask BIPAP.  Patient wears full face mask at home.  Patient placed on new mask tolerating well at this time. RN aware of changes.

## 2018-11-28 NOTE — Consult Note (Signed)
Renal Service Consult Note [facility]  Seth [name] 11/28/2018 Seth [name] Requesting Physician:  Dr Sherry Ruffing  Reason for Consult:  ESRD pt w/ SOB HPI: The patient is a 53 y.o. year-old with hx of DM2, HTN and eSRD on HD presented to ED w/ cough and SOB.  CXR showed minimal edema, K+ was 7.1 and EKG w/o any changes.  ABG showed CO2 retention pH 7.18/ 63/73 asked to see for dialysis.   Patient missed MOnday HD, +cough and URI symptoms. No fevers or chills.  +orthopnea. No sig chest pain.  Trop 0.03. K 7.1, Na 124 CO2 17  BUN 90  CR 13.8  AG 22  Alb 3.1.  LFT's up some.  Ca 8.5   ROS  denies CP  no joint pain   no HA  no blurry vision  no rash  no diarrhea  no nausea/ vomiting   Past Medical History  Past Medical History:  Diagnosis Date  . Diabetes mellitus    INSULIN DEPENDENT DIABETES  . Dialysis patient ([place])   . 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 ESRD (end stage renal disease) on dialysis [facility])    [facility] M/W/F  . Hypertension   . 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 Sleep apnea    USES CPAP   Past Surgical History  Past Surgical History:  Procedure Laterality Date  . A/V FISTULAGRAM N/A 10/18/2018   Procedure: A/V FISTULAGRAM;  Surgeon: Marty Heck, MD;  Location: [place] CV LAB;  Service: Cardiovascular;  Laterality: N/A;  . AV FISTULA PLACEMENT  05/03/2012   Procedure: ARTERIOVENOUS (AV) FISTULA CREATION;  Surgeon: Mal [name], MD;  Location: [place];  Service: Vascular;  Laterality: Right;  . AV FISTULA PLACEMENT Left 10/23/2018   Procedure: ARTERIOVENOUS (AV) FISTULA CREATION;  Surgeon: Waynetta Sandy, MD;  Location: [place];  Service: Vascular;  Laterality: Left;  . ORIF MANDIBULAR FRACTURE N/A 11/10/2017   Procedure: OPEN REDUCTION INTERNAL FIXATION (ORIF) MANDIBULAR FRACTURE POSSIBLE MAXILLARY MANDIBULAR FIXATION;  Surgeon: Jodi Marble, MD;  Location: [place];   Service: ENT;  Laterality: N/A;  . REVISON OF ARTERIOVENOUS FISTULA Right 05/31/2018   Procedure: REVISION OF ARTERIOVENOUS FISTULA  RIGHT ARM WITH INTERPOSITION ARTEGRAFT 6MM X 16CM GRAFT;  Surgeon: Waynetta Sandy, MD;  Location: [place];  Service: Vascular;  Laterality: Right;  . TEE WITHOUT CARDIOVERSION N/A 07/10/2015   Procedure: TRANSESOPHAGEAL ECHOCARDIOGRAM (TEE);  Surgeon: Josue Hector, MD;  Location: [place];  Service: Cardiovascular;  Laterality: N/A;  . tracheostomy removal    . TRACHEOSTOMY TUBE PLACEMENT N/A 08/20/2013   Procedure: TRACHEOSTOMY Revision;  Surgeon: Melida Quitter, MD;  Location: [facility] OR;  Service: ENT;  Laterality: N/A;   Family History  Family History  Problem Relation Age of Onset  . Diabetes Mother   . Cancer Mother   . Diabetes Sister   . Diabetes Brother    Social History  reports that he has never smoked. He has never used smokeless tobacco. He reports previous drug use. Drug: Marijuana. He reports that he does not drink alcohol. Allergies No Known Allergies Home medications Prior to Admission medications   Medication Sig Start Date End Date Taking? Authorizing Provider  amLODipine (NORVASC) 10 MG tablet Take 1 tablet (10 mg total) by mouth daily. 11/21/17  Yes Lenore Cordia, MD  aspirin EC 81 MG tablet Take 81 mg by mouth daily.  Yes [provider]  atorvastatin (LIPITOR) 40 MG tablet Take 1 tablet (40 mg total) by mouth daily at 6 PM. 11/21/17  Yes Patel, Cleaster Corin, MD  AURYXIA 1 GM 210 MG(Fe) tablet Take 630 mg by mouth See admin instructions. Take 630 mg 3 times daily with each meal and 420 mg with each snack 11/02/16  Yes [provider]  diclofenac sodium (VOLTAREN) 1 % GEL Apply 4 g topically 4 (four) times daily. Patient taking differently: Apply 4 g topically daily as needed (knee pain).  03/28/18  Yes Patel, Roxanne Mins R, MD  insulin aspart (NOVOLOG FLEXPEN) 100 UNIT/ML FlexPen Inject 0-20 Units into the skin 3 (three)  times daily with meals. CBG < 70: drink orange juice and recheck glucose or use glucose tabs. Patient taking differently: Inject 5-15 Units into the skin 3 (three) times daily with meals. CBG < 70: drink orange juice and recheck glucose or use glucose tabs. 03/01/18  Yes Lenore Cordia, MD  oxyCODONE-acetaminophen (PERCOCET) 10-325 MG tablet Take 1 tablet by mouth 3 (three) times daily as needed for pain. 11/20/18  Yes Carroll Sage, MD  triamcinolone cream (KENALOG) 0.1 % Apply 1 application topically 2 (two) times daily. Patient taking differently: Apply 1 application topically 2 (two) times daily as needed (rash).  07/10/18  Yes Velna Ochs, MD   Liver Function Tests Recent Labs  Lab 11/28/18 0808  AST 142*  ALT 78*  ALKPHOS 91  BILITOT 1.7*  PROT 8.2*  ALBUMIN 3.1*   Recent Labs  Lab 11/28/18 0808  LIPASE 22   CBC Recent Labs  Lab 11/28/18 0808  WBC 17.4*  NEUTROABS 15.0*  HGB 10.8*  HCT 35.2*  MCV 97.0  PLT 505   Basic Metabolic Panel Recent Labs  Lab 11/28/18 0808  NA 124*  K 7.1*  CL 85*  CO2 17*  GLUCOSE 298*  BUN 90*  CREATININE 13.88*  CALCIUM 8.5*   Iron/TIBC/Ferritin/ %Sat    Component Value Date/Time   IRON 193 (H) 07/04/2015 1800   TIBC 221 (L) 07/04/2015 1800   FERRITIN 129 04/30/2012 1233   IRONPCTSAT 87 (H) 07/04/2015 1800    Vitals:   11/28/18 0830 11/28/18 0845 11/28/18 0900 11/28/18 0945  BP:  108/68  127/68  Pulse: 72 78 75 75  Resp: (!) 25 (!) 24  (!) 24  Temp:      TempSrc:      SpO2: 99% 99%  98%  Weight:      Height:       Exam Gen on FM O2, not in distress, slightly ^wob No rash, cyanosis or gangrene Sclera anicteric, throat clear  No jvd or bruits Chest coarse rhonchi, bibasilar rales, occ wheezing RRR no MRG Abd soft ntnd no mass or ascites +bs very obese GU normal male defer MS no joint effusions or deformity Ext no pitting LE or UE edema, no wounds or ulcers Neuro is alert, Ox 3 , nf R IJ TDC exit site  c/d, old access scars bilat UE's   Home meds:  - amlodipine 10  - aspirin 81/ atorvastatin 40 hs  - insulin aspart 5- 15 u tid ac  - auryxia 630 ac tid/ oxy-aceta 10-325 tid prn   Dialysis: [facility] [address]  4h   138.5kg   Heparin 4500 + 1000/hr   R IJ TDC     Assessment: 1. SOB/ hypoxemia - mild edema on CXR, +rales on exam, will need HD today 2. Hyperkalemia -  d/t missed HD Monday.  No EKG changes.  3. DM2 on insluin  4. HTN cont meds 5. 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 HD access:  Had new LUE AVF 10/23/18 here. Prior access R arm AVF recently failed late 2019.      P: 1. HD upstairs , low K+ bath, max UF      [facility] 11/28/2018, 10:02 AM
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
## 2018-11-28 NOTE — ED Notes (Signed)
SUGAR CHECKED, 232. RN Maggie notified.

## 2018-11-28 NOTE — Procedures (Signed)
   I was present at this dialysis session, have reviewed the session itself and made  appropriate changes Kelly Splinter MD [place] pager [phone]   11/28/2018, 1:55 PM

## 2018-11-29 ENCOUNTER — Encounter (HOSPITAL_COMMUNITY): Payer: Self-pay

## 2018-11-29 ENCOUNTER — Inpatient Hospital Stay (HOSPITAL_COMMUNITY): Payer: Medicare Other

## 2018-11-29 DIAGNOSIS — E871 Hypo-osmolality and hyponatremia: Secondary | ICD-10-CM | POA: Diagnosis present

## 2018-11-29 DIAGNOSIS — Z79899 Other long term (current) drug therapy: Secondary | ICD-10-CM

## 2018-11-29 DIAGNOSIS — G4733 Obstructive sleep apnea (adult) (pediatric): Secondary | ICD-10-CM

## 2018-11-29 DIAGNOSIS — E1122 Type 2 diabetes mellitus with diabetic chronic kidney disease: Secondary | ICD-10-CM

## 2018-11-29 DIAGNOSIS — B9561 Methicillin susceptible Staphylococcus aureus infection as the cause of diseases classified elsewhere: Secondary | ICD-10-CM

## 2018-11-29 DIAGNOSIS — G8929 Other chronic pain: Secondary | ICD-10-CM

## 2018-11-29 DIAGNOSIS — R079 Chest pain, unspecified: Secondary | ICD-10-CM

## 2018-11-29 DIAGNOSIS — M549 Dorsalgia, unspecified: Secondary | ICD-10-CM

## 2018-11-29 DIAGNOSIS — Z9989 Dependence on other enabling machines and devices: Secondary | ICD-10-CM

## 2018-11-29 DIAGNOSIS — M4644 Discitis, unspecified, thoracic region: Secondary | ICD-10-CM

## 2018-11-29 DIAGNOSIS — R7881 Bacteremia: Secondary | ICD-10-CM | POA: Diagnosis present

## 2018-11-29 DIAGNOSIS — Z8619 Personal history of other infectious and parasitic diseases: Secondary | ICD-10-CM

## 2018-11-29 DIAGNOSIS — N186 End stage renal disease: Secondary | ICD-10-CM

## 2018-11-29 DIAGNOSIS — J9601 Acute respiratory failure with hypoxia: Secondary | ICD-10-CM

## 2018-11-29 DIAGNOSIS — Z792 Long term (current) use of antibiotics: Secondary | ICD-10-CM

## 2018-11-29 DIAGNOSIS — E874 Mixed disorder of acid-base balance: Secondary | ICD-10-CM

## 2018-11-29 DIAGNOSIS — I214 Non-ST elevation (NSTEMI) myocardial infarction: Secondary | ICD-10-CM | POA: Diagnosis present

## 2018-11-29 DIAGNOSIS — M4620 Osteomyelitis of vertebra, site unspecified: Secondary | ICD-10-CM

## 2018-11-29 DIAGNOSIS — R7989 Other specified abnormal findings of blood chemistry: Secondary | ICD-10-CM

## 2018-11-29 DIAGNOSIS — M25561 Pain in right knee: Secondary | ICD-10-CM

## 2018-11-29 DIAGNOSIS — R05 Cough: Secondary | ICD-10-CM

## 2018-11-29 DIAGNOSIS — J9602 Acute respiratory failure with hypercapnia: Secondary | ICD-10-CM | POA: Diagnosis present

## 2018-11-29 DIAGNOSIS — M25461 Effusion, right knee: Secondary | ICD-10-CM

## 2018-11-29 DIAGNOSIS — Z992 Dependence on renal dialysis: Secondary | ICD-10-CM

## 2018-11-29 DIAGNOSIS — Z794 Long term (current) use of insulin: Secondary | ICD-10-CM

## 2018-11-29 LAB — BLOOD CULTURE ID PANEL (REFLEXED)
Acinetobacter baumannii: NOT DETECTED
CANDIDA KRUSEI: NOT DETECTED
CANDIDA PARAPSILOSIS: NOT DETECTED
Candida albicans: NOT DETECTED
Candida glabrata: NOT DETECTED
Candida tropicalis: NOT DETECTED
Enterobacter cloacae complex: NOT DETECTED
Enterobacteriaceae species: NOT DETECTED
Enterococcus species: NOT DETECTED
Escherichia coli: NOT DETECTED
Haemophilus influenzae: NOT DETECTED
KLEBSIELLA OXYTOCA: NOT DETECTED
Klebsiella pneumoniae: NOT DETECTED
Listeria monocytogenes: NOT DETECTED
Methicillin resistance: NOT DETECTED
Neisseria meningitidis: NOT DETECTED
Proteus species: NOT DETECTED
Pseudomonas aeruginosa: NOT DETECTED
Serratia marcescens: NOT DETECTED
Staphylococcus aureus (BCID): DETECTED — AB
Staphylococcus species: DETECTED — AB
Streptococcus agalactiae: NOT DETECTED
Streptococcus pneumoniae: NOT DETECTED
Streptococcus pyogenes: NOT DETECTED
Streptococcus species: NOT DETECTED

## 2018-11-29 LAB — RENAL FUNCTION PANEL
ANION GAP: 17 — AB (ref 5–15)
Albumin: 2.8 g/dL — ABNORMAL LOW (ref 3.5–5.0)
BUN: 64 mg/dL — ABNORMAL HIGH (ref 6–20)
CO2: 24 mmol/L (ref 22–32)
Calcium: 7.4 mg/dL — ABNORMAL LOW (ref 8.9–10.3)
Chloride: 88 mmol/L — ABNORMAL LOW (ref 98–111)
Creatinine, Ser: 11.43 mg/dL — ABNORMAL HIGH (ref 0.61–1.24)
GFR calc non Af Amer: 4 mL/min — ABNORMAL LOW (ref >60)
GFR, EST AFRICAN AMERICAN: 5 mL/min — AB (ref >60)
Glucose, Bld: 197 mg/dL — ABNORMAL HIGH (ref 70–99)
POTASSIUM: 5.4 mmol/L — AB (ref 3.5–5.1)
Phosphorus: 5.8 mg/dL — ABNORMAL HIGH (ref 2.5–4.6)
Sodium: 129 mmol/L — ABNORMAL LOW (ref 135–145)

## 2018-11-29 LAB — GLUCOSE, CAPILLARY
Glucose-Capillary: 117 mg/dL — ABNORMAL HIGH (ref 70–99)
Glucose-Capillary: 123 mg/dL — ABNORMAL HIGH (ref 70–99)
Glucose-Capillary: 127 mg/dL — ABNORMAL HIGH (ref 70–99)
Glucose-Capillary: 131 mg/dL — ABNORMAL HIGH (ref 70–99)
Glucose-Capillary: 133 mg/dL — ABNORMAL HIGH (ref 70–99)
Glucose-Capillary: 139 mg/dL — ABNORMAL HIGH (ref 70–99)
Glucose-Capillary: 144 mg/dL — ABNORMAL HIGH (ref 70–99)
Glucose-Capillary: 157 mg/dL — ABNORMAL HIGH (ref 70–99)
Glucose-Capillary: 161 mg/dL — ABNORMAL HIGH (ref 70–99)
Glucose-Capillary: 169 mg/dL — ABNORMAL HIGH (ref 70–99)
Glucose-Capillary: 207 mg/dL — ABNORMAL HIGH (ref 70–99)

## 2018-11-29 LAB — CBC
HCT: 33.9 % — ABNORMAL LOW (ref 39.0–52.0)
HEMOGLOBIN: 10.4 g/dL — AB (ref 13.0–17.0)
MCH: 30 pg (ref 26.0–34.0)
MCHC: 30.7 g/dL (ref 30.0–36.0)
MCV: 97.7 fL (ref 80.0–100.0)
Platelets: 161 10*3/uL (ref 150–400)
RBC: 3.47 MIL/uL — AB (ref 4.22–5.81)
RDW: 14.2 % (ref 11.5–15.5)
WBC: 14.6 10*3/uL — ABNORMAL HIGH (ref 4.0–10.5)
nRBC: 0.1 % (ref 0.0–0.2)

## 2018-11-29 LAB — BASIC METABOLIC PANEL
Anion gap: 18 — ABNORMAL HIGH (ref 5–15)
BUN: 54 mg/dL — ABNORMAL HIGH (ref 6–20)
CO2: 22 mmol/L (ref 22–32)
Calcium: 8.1 mg/dL — ABNORMAL LOW (ref 8.9–10.3)
Chloride: 90 mmol/L — ABNORMAL LOW (ref 98–111)
Creatinine, Ser: 10.67 mg/dL — ABNORMAL HIGH (ref 0.61–1.24)
GFR calc Af Amer: 6 mL/min — ABNORMAL LOW (ref >60)
GFR calc non Af Amer: 5 mL/min — ABNORMAL LOW (ref >60)
Glucose, Bld: 156 mg/dL — ABNORMAL HIGH (ref 70–99)
Potassium: 5.6 mmol/L — ABNORMAL HIGH (ref 3.5–5.1)
Sodium: 130 mmol/L — ABNORMAL LOW (ref 135–145)

## 2018-11-29 LAB — BETA-HYDROXYBUTYRIC ACID: Beta-Hydroxybutyric Acid: 0.36 mmol/L — ABNORMAL HIGH (ref 0.05–0.27)

## 2018-11-29 LAB — TROPONIN I: Troponin I: 0.74 ng/mL (ref <0.03)

## 2018-11-29 MED ORDER — INSULIN ASPART 100 UNIT/ML ~~LOC~~ SOLN
0.0000 [IU] | Freq: Three times a day (TID) | SUBCUTANEOUS | Status: DC
  Administered 2018-11-29: 3 [IU] via SUBCUTANEOUS
  Administered 2018-11-29: 2 [IU] via SUBCUTANEOUS

## 2018-11-29 MED ORDER — INSULIN GLARGINE 100 UNIT/ML ~~LOC~~ SOLN
3.0000 [IU] | Freq: Every day | SUBCUTANEOUS | Status: DC
  Administered 2018-11-29 – 2018-11-30 (×2): 3 [IU] via SUBCUTANEOUS
  Filled 2018-11-29 (×2): qty 0.03

## 2018-11-29 MED ORDER — INSULIN ASPART 100 UNIT/ML ~~LOC~~ SOLN
0.0000 [IU] | Freq: Every day | SUBCUTANEOUS | Status: DC
  Administered 2018-11-30 – 2018-12-01 (×2): 4 [IU] via SUBCUTANEOUS
  Administered 2018-12-02: 3 [IU] via SUBCUTANEOUS
  Administered 2018-12-03: 4 [IU] via SUBCUTANEOUS
  Administered 2018-12-04 – 2018-12-06 (×2): 3 [IU] via SUBCUTANEOUS

## 2018-11-29 MED ORDER — MIDODRINE HCL 5 MG PO TABS
10.0000 mg | ORAL_TABLET | Freq: Once | ORAL | Status: AC
  Administered 2018-11-29: 10 mg via ORAL
  Filled 2018-11-29: qty 2

## 2018-11-29 MED ORDER — SODIUM CHLORIDE 0.9 % IV SOLN: 1.0000 g | Freq: Once | INTRAVENOUS | Status: DC

## 2018-11-29 MED ORDER — HEPARIN SODIUM (PORCINE) 1000 UNIT/ML DIALYSIS
4000.0000 [IU] | INTRAMUSCULAR | Status: DC | PRN
  Administered 2018-11-29 – 2018-11-30 (×2): 4000 [IU] via INTRAVENOUS_CENTRAL

## 2018-11-29 MED ORDER — SODIUM CHLORIDE 0.9 % IV SOLN: 2.0000 g | INTRAVENOUS | Status: DC

## 2018-11-29 MED ORDER — CHLORHEXIDINE GLUCONATE CLOTH 2 % EX PADS
6.0000 | MEDICATED_PAD | Freq: Every day | CUTANEOUS | Status: DC
  Administered 2018-12-01: 6 via TOPICAL

## 2018-11-29 MED ORDER — CEFAZOLIN SODIUM-DEXTROSE 2-4 GM/100ML-% IV SOLN: 2.0000 g | INTRAVENOUS | Status: DC

## 2018-11-29 MED ORDER — CHLORHEXIDINE GLUCONATE CLOTH 2 % EX PADS
6.0000 | MEDICATED_PAD | Freq: Every day | CUTANEOUS | Status: DC
  Administered 2018-11-29: 6 via TOPICAL

## 2018-11-29 MED ORDER — CEFAZOLIN SODIUM-DEXTROSE 2-4 GM/100ML-% IV SOLN
2.0000 g | INTRAVENOUS | Status: AC
  Administered 2018-11-29: 2 g via INTRAVENOUS
  Filled 2018-11-29: qty 100

## 2018-11-29 MED ORDER — CEFAZOLIN SODIUM-DEXTROSE 2-4 GM/100ML-% IV SOLN
2.0000 g | INTRAVENOUS | Status: DC
  Administered 2018-11-29: 2 g via INTRAVENOUS
  Filled 2018-11-29 (×2): qty 100

## 2018-11-29 MED ORDER — HEPARIN SODIUM (PORCINE) 1000 UNIT/ML IJ SOLN
INTRAMUSCULAR | Status: AC
  Filled 2018-11-29: qty 4

## 2018-11-29 MED ORDER — CEFAZOLIN SODIUM-DEXTROSE 2-4 GM/100ML-% IV SOLN: 2.0000 g | Freq: Once | INTRAVENOUS | Status: DC

## 2018-11-29 MED ORDER — HEPARIN SODIUM (PORCINE) 1000 UNIT/ML IJ SOLN
INTRAMUSCULAR | Status: AC
  Filled 2018-11-29: qty 2

## 2018-11-29 MED ORDER — INSULIN REGULAR(HUMAN) IN NACL 100-0.9 UT/100ML-% IV SOLN
INTRAVENOUS | Status: DC
  Administered 2018-11-29: 0.5 [IU]/h via INTRAVENOUS

## 2018-11-29 MED ORDER — DEXTROSE-NACL 5-0.45 % IV SOLN
INTRAVENOUS | Status: DC
  Administered 2018-11-29: 03:00:00 via INTRAVENOUS

## 2018-11-29 NOTE — Progress Notes (Signed)
Subjective: Feels better today. Denies chest pain. Much more awake and alert. Answered questions about what was going on. Discussed plan for continued HD, antibiotics, and repeat blood work and CXR.   Objective:  Vital signs in last 24 hours: Vitals:   11/29/18 0520 11/29/18 0545 11/29/18 0756 11/29/18 0800  BP:   (!) 143/81 (!) 143/81  Pulse: 89  100 100  Resp: (!) 27  (!) 22   Temp:   100.2 F (37.9 C) 100.2 F (37.9 C)  TempSrc:      SpO2: (!) 89% 98% 99% 99%  Weight:      Height:       Gen: obese male laying in bed, NAD, alert and talkative Cardiac: RRR, difficult to appreciate any m/r/g due to body habitus Pulm: decreased breath sounds throughout, CTAB Ext: no LEE Abd: soft, NT, ND  Assessment/Plan:  Active Problems:   Hyperkalemia  [name] with HTN, hyperlipidemia, type 2 diabetes, ESRD on MWF HD, OSA on cpap, who presented with hyperkalemia 7.1 after missing one HD session, MSSA bacteremia, acute hypoxic respiratory failure with respiratory acidosis and mixed anion gap metabolic acidosis.   ESRD on HD, hyperkalemia: Hyperkalemia improved to 5.8 after HD. Also treated with albuterol, IV gluconate, and novolog in the ED. Repeat EKG this morning is reassuring.  - HD today  - repeat ekg done this morning   MSSA Bacteremia: started on cefazolin overnight. Vital signs are stable this morning. Most likely source is temporary right IJ dialysis catheter. Spoke with ID and nephrology. Plan is to do HD today and tomorrow and then take out the catheter over the weekend. ID also notes need for TEE and MR right knee and MR lumbar spine at some point to evaluate for osteomyelitis since he is endorsing pain in those areas. He has a previous history of discitis.  - appreciate ID and nephrology's assistance  - continue cefazolin - f/u BCx - check lactate  - MR right knee and lumbar spine once patient is more stable - TTE today, will likely also need a TEE once he is more stable  -  remove right IJ catheter once he can take a few days off HD  Anion Gap Metabolic Acidosis: slightly improved after HD and DKA protocol. Beta hydroxybutyrate was elevated on admission but I suspect his elevated anion gap is also due to lactic acidosis in the setting of bacteremia and also electrolyte abnormalities from ESRD and missing HD sessions.  - discontinue insulin drip and D5 1/2 NS - start short acting novolog. Mr. Statler denies using long acting at home. We will monitor his CBGs today and consider adding long acting tonight  - repeat beta hydroxybutyric acid  - HD as above - f/u lactate as above - repeat BMP tomorrow   Respiratory Acidosis: initial VBG pH 7.1, pCO2 64, bicarb 24. Suspect mixed respiratory and metabolic acidosis. Placed on bipap overnight. He is alert and awake this morning, satting well on [place].  - repeat VBG - continue cpap qhs and bipap prn decreased mental status   Possible CAP: given azithromycin and ceftriaxone on admission for fever and cough. Lung exam is difficult due to body habitus but I do not hear any crackles. CXR on admission confounded by pulmonary edema. Will get repeat CXR after HD today to further evaluate for possible pneumonia. RVP was negative. I do not think we need to continue atypical coverage with azithromycin at this time.  - f/u repeat CXR after HD  Elevated  Troponins: 0.30 --> 0.62 --> 0.75. Most likely demand ischemia, but definitely has risk factors for CAD.  No active chest pain. No prior heart caths. Last echo in 2016 showed normal EF, g1dd, without wall motion abnormalities.  - continue trending troponins q12h until they peak  - if active chest pain, repeat EKG and troponin   HTN: holding home amlodipine HLD: continue home statin Chronic pain: continue home oxycodone-acetaminophen with hold parameters   Dispo: Anticipated discharge in several days   Isabelle Course, MD 11/29/2018, 10:38 AM Pager: [phone]

## 2018-11-29 NOTE — Consult Note (Signed)
Date of Admission:  11/28/2018          Reason for Consult: MSSA bacteremia    Referring Provider: ""[facility] consult" and Dr. Rebeca Alert   Assessment:  1. MSSAB with HD catheter in place 2. Right knee pain 3. 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 T spine diskitis and vertebral osteomyelitis that required surgical debridement with T4-5 decompressive laminectomy, transpedicular corpectomy  Plan:  1. Narrowed to cefazolin 2. Once he has had sufficient hemodialysis his hemodialysis catheter will be removed and we will have him have a central line holiday and recheck blood cultures after line has been removed 3. He needs to have his knee imaged and/or aspirated for cell count and differential and culture 4. Want to reimage his spine because he is complaining of back pain we would get an MRI of his T and L-spine  Active Problems:   Hyperkalemia   Scheduled Meds: . aspirin EC  81 mg Oral Daily  . atorvastatin  40 mg Oral q1800  . chlorhexidine  15 mL Mouth Rinse BID  . Chlorhexidine Gluconate Cloth  6 each Topical Q0600  . ferric citrate  630 mg Oral TID WC  . heparin  5,000 Units Subcutaneous Q8H  . insulin aspart  0-5 Units Subcutaneous QHS  . insulin aspart  0-9 Units Subcutaneous TID WC  . mouth rinse  15 mL Mouth Rinse q12n4p  . midodrine  10 mg Oral Once  . pneumococcal 23 valent vaccine  0.5 mL Intramuscular Tomorrow-1000   Continuous Infusions: . sodium chloride    . albuterol Stopped (11/28/18 1100)  . calcium chloride  IV Stopped (11/28/18 1100)  .  ceFAZolin (ANCEF) IV    . dextrose 5 % and 0.45% NaCl Stopped (11/29/18 0700)   PRN Meds:.ferric citrate, heparin, oxyCODONE-acetaminophen **AND** oxyCODONE  HPI: Seth [name] is a 53 y.o. male diabetes mellitus end-stage renal disease on hemodialysis with prior history of Staphylococcus lugdunensis bacteremia in 2016.  At that time  he underwent aggressive work-up including a transesophageal echocardiogram that failed to show evidence of endocarditis.  He was given a 2-week course of cefazolin but then found to have severe T-spine infection that required surgical debridement with T4-5 decompressive laminectomy, transpedicular corpectomy.  And he was treated with an appropriately long course of IV antibiotics for this with cefazolin.  He is me that his right knee is been hurting him for " a minute."  He also has had worsening low back pain.  Most acute symptom however he had was onset of profound fatigue last weekend.  He then began coughing a fair amount in particular after he missed his dialysis on Monday.  He then felt so poor that he called EMS due to worsening weakness.  He was hypoxic in the field requiring oxygen in the ER he was febrile infiltrates on chest x-ray.  He was initially had blood cultures taken and then was started on antibiotics for possible pneumonia with ceftriaxone and azithromycin.  His blood cultures are now Positive for methicillin sensitive staph aureus as identified by [facility] ID.  We will have narrowed him to cefazolin.  He will need infection worked up in his knee with either an MRI and/or aspirate of the joint sent for cell count and differential) higher priority) and culture.  Also given his back pain he will need imaging of his knee and L-spine with an MRI.  He tells me he  needs oxycodone to build to tolerate the MRI.  He also need his heart valves evaluated and I would start with a 2D echocardiogram for this.  My understanding is he will have his central line discontinued after he has had sufficient dialysis actions and with likely no access this weekend.  Hopefully we can show that his blood cultures are becoming sterile by taking them after his dialysis catheter is been removed and it will then be safer to place a new catheter next week.      Review of Systems: Review of Systems  Constitutional:  Positive for chills, diaphoresis, fever and malaise/fatigue.  Eyes: Negative for blurred vision, double vision, photophobia, pain, discharge and redness.  Respiratory: Positive for cough and shortness of breath.   Cardiovascular: Positive for chest pain.  Gastrointestinal: Negative for abdominal pain, diarrhea, heartburn, melena, nausea and vomiting.  Musculoskeletal: Positive for back pain, joint pain and myalgias.  Neurological: Positive for dizziness and weakness.  Psychiatric/Behavioral: Negative for depression, hallucinations, substance abuse and suicidal ideas.    Past Medical History:  Diagnosis Date  . Diabetes mellitus    INSULIN DEPENDENT DIABETES  . Dialysis patient ([place])   . 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 ESRD (end stage renal disease) on dialysis [facility])    [facility] M/W/F  . Hypertension   . 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 Sleep apnea    USES CPAP    Social History   Tobacco Use  . Smoking status: Never Smoker  . Smokeless tobacco: Never Used  Substance Use Topics  . Alcohol use: No    Frequency: Never  . Drug use: Not Currently    Types: Marijuana    Family History  Problem Relation Age of Onset  . Diabetes Mother   . Cancer Mother   . Diabetes Sister   . Diabetes Brother    No Known Allergies  OBJECTIVE: Blood pressure (!) 143/81, pulse 100, temperature 100.2 F (37.9 C), resp. rate (!) 22, height 5\' 7"  (1.702 m), weight (!) 145.2 kg, SpO2 99 %.  Physical Exam Constitutional:      General: He is not in acute distress.    Appearance: Normal appearance. He is well-developed. He is obese. He is not ill-appearing or diaphoretic.  HENT:     Head: Normocephalic and atraumatic.     Right Ear: Hearing and external ear normal.     Left Ear: Hearing and external ear normal.     Nose: Nose normal. No nasal deformity or rhinorrhea.  Eyes:     General: No scleral icterus.       Right eye: No  discharge.        Left eye: No discharge.     Conjunctiva/sclera: Conjunctivae normal.     Right eye: Right conjunctiva is not injected.     Left eye: Left conjunctiva is not injected.  Neck:     Musculoskeletal: Normal range of motion and neck supple.     Vascular: No JVD.  Cardiovascular:     Rate and Rhythm: Normal rate and regular rhythm.     Heart sounds: Normal heart sounds, S1 normal and S2 normal. No murmur. No friction rub. No gallop.   Pulmonary:     Effort: Pulmonary effort is normal. No respiratory distress.     Breath sounds: Normal breath sounds. No stridor. No wheezing, rhonchi or rales.  Abdominal:     General: Abdomen is  flat. Bowel sounds are normal. There is no distension.     Palpations: Abdomen is soft. There is no mass.     Tenderness: There is no abdominal tenderness.  Musculoskeletal:     Right shoulder: Normal.     Left shoulder: Normal.     Right hip: Normal.     Left hip: Normal.     Right knee: He exhibits effusion. Tenderness found.     Left knee: Normal.  Lymphadenopathy:     Head:     Right side of head: No submandibular, preauricular or posterior auricular adenopathy.     Left side of head: No submandibular, preauricular or posterior auricular adenopathy.     Cervical: No cervical adenopathy.     Right cervical: No superficial or deep cervical adenopathy.    Left cervical: No superficial or deep cervical adenopathy.  Skin:    General: Skin is warm and dry.     Coloration: Skin is not pale.     Findings: No abrasion, bruising, ecchymosis, erythema, lesion or rash.     Nails: There is no clubbing.   Neurological:     Mental Status: He is alert and oriented to person, place, and time.     Sensory: No sensory deficit.     Coordination: Coordination normal.     Gait: Gait normal.  Psychiatric:        Attention and Perception: He is attentive.        Speech: Speech normal.        Behavior: Behavior normal. Behavior is cooperative.         Thought Content: Thought content normal.        Judgment: Judgment normal.    HD catheter in upper chest  Lab Results Lab Results  Component Value Date   WBC 16.8 (H) 11/28/2018   HGB 11.3 (L) 11/28/2018   HCT 36.5 (L) 11/28/2018   MCV 96.8 11/28/2018   PLT 173 11/28/2018    Lab Results  Component Value Date   CREATININE 10.67 (H) 11/29/2018   BUN 54 (H) 11/29/2018   NA 130 (L) 11/29/2018   K 5.6 (H) 11/29/2018   CL 90 (L) 11/29/2018   CO2 22 11/29/2018    Lab Results  Component Value Date   ALT 78 (H) 11/28/2018   AST 142 (H) 11/28/2018   ALKPHOS 91 11/28/2018   BILITOT 1.7 (H) 11/28/2018     Microbiology: Recent Results (from the past 240 hour(s))  Culture, blood (Routine X 2) w Reflex to ID Panel     Status: Abnormal (Preliminary result)   Collection Time: 11/28/18  8:08 AM  Result Value Ref Range Status   Specimen Description BLOOD RIGHT ANTECUBITAL  Final   Special Requests   Final    BOTTLES DRAWN AEROBIC AND ANAEROBIC Blood Culture adequate volume   Culture  Setup Time   Final    GRAM POSITIVE COCCI IN BOTH AEROBIC AND ANAEROBIC BOTTLES CRITICAL RESULT CALLED TO, READ BACK BY AND VERIFIED WITH: C AMEND PHARMD 11/29/18 0010 JDW    Culture (A)  Final    STAPHYLOCOCCUS AUREUS SUSCEPTIBILITIES TO FOLLOW Performed at [facility], [address] [place], [place] [address]    Report Status PENDING  Incomplete  Blood Culture ID Panel (Reflexed)     Status: Abnormal   Collection Time: 11/28/18  8:08 AM  Result Value Ref Range Status   Enterococcus species NOT DETECTED NOT DETECTED Final   Listeria monocytogenes NOT DETECTED NOT  DETECTED Final   Staphylococcus species DETECTED (A) NOT DETECTED Final    Comment: CRITICAL RESULT CALLED TO, READ BACK BY AND VERIFIED WITH: C AMEND PHARMD 11/29/18 0010 JDW    Staphylococcus aureus (BCID) DETECTED (A) NOT DETECTED Final    Comment: Methicillin (oxacillin) susceptible Staphylococcus aureus (MSSA). Preferred  therapy is anti staphylococcal beta lactam antibiotic (Cefazolin or Nafcillin), unless clinically contraindicated. CRITICAL RESULT CALLED TO, READ BACK BY AND VERIFIED WITH: C AMEND PHARMD 11/29/18 0010 JDW    Methicillin resistance NOT DETECTED NOT DETECTED Final   Streptococcus species NOT DETECTED NOT DETECTED Final   Streptococcus agalactiae NOT DETECTED NOT DETECTED Final   Streptococcus pneumoniae NOT DETECTED NOT DETECTED Final   Streptococcus pyogenes NOT DETECTED NOT DETECTED Final   Acinetobacter baumannii NOT DETECTED NOT DETECTED Final   Enterobacteriaceae species NOT DETECTED NOT DETECTED Final   Enterobacter cloacae complex NOT DETECTED NOT DETECTED Final   Escherichia coli NOT DETECTED NOT DETECTED Final   Klebsiella oxytoca NOT DETECTED NOT DETECTED Final   Klebsiella pneumoniae NOT DETECTED NOT DETECTED Final   Proteus species NOT DETECTED NOT DETECTED Final   Serratia marcescens NOT DETECTED NOT DETECTED Final   Haemophilus influenzae NOT DETECTED NOT DETECTED Final   Neisseria meningitidis NOT DETECTED NOT DETECTED Final   Pseudomonas aeruginosa NOT DETECTED NOT DETECTED Final   Candida albicans NOT DETECTED NOT DETECTED Final   Candida glabrata NOT DETECTED NOT DETECTED Final   Candida krusei NOT DETECTED NOT DETECTED Final   Candida parapsilosis NOT DETECTED NOT DETECTED Final   Candida tropicalis NOT DETECTED NOT DETECTED Final    Comment: Performed at [facility], [address] [place], [place] [address]  Culture, blood (Routine X 2) w Reflex to ID Panel     Status: None (Preliminary result)   Collection Time: 11/28/18 11:13 AM  Result Value Ref Range Status   Specimen Description BLOOD RIGHT HAND  Final   Special Requests   Final    BOTTLES DRAWN AEROBIC AND ANAEROBIC Blood Culture adequate volume   Culture  Setup Time   Final    GRAM POSITIVE COCCI IN BOTH AEROBIC AND ANAEROBIC BOTTLES CRITICAL VALUE NOTED.  VALUE IS CONSISTENT WITH PREVIOUSLY  REPORTED AND CALLED VALUE. Performed at [facility], [place] [address], [place], [place] [address]    Culture GRAM POSITIVE COCCI  Final   Report Status PENDING  Incomplete  Respiratory Panel by PCR     Status: None   Collection Time: 11/28/18  8:29 PM  Result Value Ref Range Status   Adenovirus NOT DETECTED NOT DETECTED Final   Coronavirus 229E NOT DETECTED NOT DETECTED Final    Comment: (NOTE) The Coronavirus on the Respiratory Panel, DOES NOT test for the novel  Coronavirus (2019 nCoV)    Coronavirus HKU1 NOT DETECTED NOT DETECTED Final   Coronavirus NL63 NOT DETECTED NOT DETECTED Final   Coronavirus OC43 NOT DETECTED NOT DETECTED Final   Metapneumovirus NOT DETECTED NOT DETECTED Final   Rhinovirus / Enterovirus NOT DETECTED NOT DETECTED Final   Influenza A NOT DETECTED NOT DETECTED Final   Influenza B NOT DETECTED NOT DETECTED Final   Parainfluenza Virus 1 NOT DETECTED NOT DETECTED Final   Parainfluenza Virus 2 NOT DETECTED NOT DETECTED Final   Parainfluenza Virus 3 NOT DETECTED NOT DETECTED Final   Parainfluenza Virus 4 NOT DETECTED NOT DETECTED Final   Respiratory Syncytial Virus NOT DETECTED NOT DETECTED Final   Bordetella pertussis NOT DETECTED NOT DETECTED  Final   Chlamydophila pneumoniae NOT DETECTED NOT DETECTED Final   Mycoplasma pneumoniae NOT DETECTED NOT DETECTED Final    Comment: Performed at [facility], [place] [address] [place], [place] [address]  MRSA PCR Screening     Status: None   Collection Time: 11/28/18  8:29 PM  Result Value Ref Range Status   MRSA by PCR NEGATIVE NEGATIVE Final    Comment:        The GeneXpert MRSA Assay (FDA approved for NASAL specimens only), is one component of a comprehensive MRSA colonization surveillance program. It is not intended to diagnose MRSA infection nor to guide or monitor treatment for MRSA infections. Performed at [facility], [place] [address] [place], [place] [address]     Alcide Evener,  [place] [facility] [phone] pager  11/29/2018, 11:07 AM

## 2018-11-29 NOTE — Consult Note (Signed)
Hospital Consult    Reason for Consult: [facility]; evaluation of left arm AV fistula Requesting Physician: Dr. Jonnie Finner MRN #:  [id number]  History of Present Illness: This is a 53 y.o. male with past medical history significant for end-stage renal disease on hemodialysis who was admitted to the hospital with MSSA bacteremia.  He is status post left arm first stage brachiobasilic fistula by Dr. Donzetta Matters on 10/23/2018.  He has had several dialysis access on both arms that have since occluded.  He is currently dialyzing via right IJ tunneled dialysis catheter.  TDC is suspected to be infected.  Patient was seen on hemodialysis this afternoon and appears to be confused and unable to provide pertinent history.  Infectious disease has also been consulted and agree with removal of tunneled dialysis catheter.  He is currently on Ancef.  Incisions of left arm have since healed and he denies signs or symptoms of steal syndrome.  He is not taking any blood thinners.  Per nephrology, plan is for removal of [facility] after hemodialysis treatment tomorrow 11/30/2018 with replacement of catheter on Monday, 12/03/2018.  Past Medical History:  Diagnosis Date  . Diabetes mellitus    INSULIN DEPENDENT DIABETES  . Dialysis patient ([place])   . 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 ESRD (end stage renal disease) on dialysis [facility])    [facility] M/W/F  . Hypertension   . 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 Sleep apnea    USES CPAP    Past Surgical History:  Procedure Laterality Date  . A/V FISTULAGRAM N/A 10/18/2018   Procedure: A/V FISTULAGRAM;  Surgeon: Marty Heck, MD;  Location: [place] [facility];  Service: Cardiovascular;  Laterality: N/A;  . AV FISTULA PLACEMENT  05/03/2012   Procedure: ARTERIOVENOUS (AV) FISTULA CREATION;  Surgeon: Mal [name], MD;  Location: [place];  Service: Vascular;  Laterality: Right;  . AV FISTULA PLACEMENT Left 10/23/2018   Procedure: ARTERIOVENOUS (AV) FISTULA CREATION;  Surgeon: Waynetta Sandy, MD;  Location: [place];  Service: Vascular;  Laterality: Left;  . ORIF MANDIBULAR FRACTURE N/A 11/10/2017   Procedure: OPEN REDUCTION INTERNAL FIXATION (ORIF) MANDIBULAR FRACTURE POSSIBLE MAXILLARY MANDIBULAR FIXATION;  Surgeon: Jodi Marble, MD;  Location: [place];  Service: ENT;  Laterality: N/A;  . REVISON OF ARTERIOVENOUS FISTULA Right 05/31/2018   Procedure: REVISION OF ARTERIOVENOUS FISTULA  RIGHT ARM WITH INTERPOSITION ARTEGRAFT 6MM X 16CM GRAFT;  Surgeon: Waynetta Sandy, MD;  Location: [place];  Service: Vascular;  Laterality: Right;  . TEE WITHOUT CARDIOVERSION N/A 07/10/2015   Procedure: TRANSESOPHAGEAL ECHOCARDIOGRAM (TEE);  Surgeon: Josue Hector, MD;  Location: [place];  Service: Cardiovascular;  Laterality: N/A;  . tracheostomy removal    . TRACHEOSTOMY TUBE PLACEMENT N/A 08/20/2013   Procedure: TRACHEOSTOMY Revision;  Surgeon: Melida Quitter, MD;  Location: [place];  Service: ENT;  Laterality: N/A;    No Known Allergies  Prior to Admission medications   Medication Sig Start Date End Date Taking? Authorizing Provider  amLODipine (NORVASC) 10 MG tablet Take 1 tablet (10 mg total) by mouth daily. 11/21/17  Yes Lenore Cordia, MD  aspirin EC 81 MG tablet Take 81 mg by mouth daily.   Yes [provider]  atorvastatin (LIPITOR) 40 MG tablet Take 1 tablet (40 mg total) by mouth daily at 6 PM. 11/21/17  Yes Patel, Cleaster Corin, MD  AURYXIA 1 GM 210 MG(Fe) tablet Take 630 mg  by mouth See admin instructions. Take 630 mg 3 times daily with each meal and 420 mg with each snack 11/02/16  Yes [provider]  diclofenac sodium (VOLTAREN) 1 % GEL Apply 4 g topically 4 (four) times daily. Patient taking differently: Apply 4 g topically daily as needed (knee pain).  03/28/18  Yes Patel, Roxanne Mins R, MD  insulin aspart (NOVOLOG FLEXPEN) 100 UNIT/ML FlexPen Inject 0-20 Units into the skin 3 (three)  times daily with meals. CBG < 70: drink orange juice and recheck glucose or use glucose tabs. Patient taking differently: Inject 5-15 Units into the skin 3 (three) times daily with meals. CBG < 70: drink orange juice and recheck glucose or use glucose tabs. 03/01/18  Yes Lenore Cordia, MD  oxyCODONE-acetaminophen (PERCOCET) 10-325 MG tablet Take 1 tablet by mouth 3 (three) times daily as needed for pain. 11/20/18  Yes Carroll Sage, MD  triamcinolone cream (KENALOG) 0.1 % Apply 1 application topically 2 (two) times daily. Patient taking differently: Apply 1 application topically 2 (two) times daily as needed (rash).  07/10/18  Yes Velna Ochs, MD    Social History   Socioeconomic History  . Marital status: Single    Spouse name: Not on file  . Number of children: Not on file  . Years of education: Not on file  . Highest education level: Not on file  Occupational History  . Not on file  Social Needs  . Financial resource strain: Not on file  . Food insecurity:    Worry: Not on file    Inability: Not on file  . Transportation needs:    Medical: Not on file    Non-medical: Not on file  Tobacco Use  . Smoking status: Never Smoker  . Smokeless tobacco: Never Used  Substance and Sexual Activity  . Alcohol use: No    Frequency: Never  . Drug use: Not Currently    Types: Marijuana  . Sexual activity: Not Currently  Lifestyle  . Physical activity:    Days per week: Not on file    Minutes per session: Not on file  . Stress: Not on file  Relationships  . Social connections:    Talks on phone: Not on file    Gets together: Not on file    Attends religious service: Not on file    Active member of club or organization: Not on file    Attends meetings of clubs or organizations: Not on file    Relationship status: Not on file  . Intimate partner violence:    Fear of current or ex partner: Not on file    Emotionally abused: Not on file    Physically abused: Not on file     Forced sexual activity: Not on file  Other Topics Concern  . 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  Divorce with 5 children, 3 boys and 2 girls.  Lives in [place].     Family History  Problem Relation Age of Onset  . Diabetes Mother   . Cancer Mother   . Diabetes Sister   . Diabetes Brother     ROS: Otherwise negative unless mentioned in HPI  Physical Examination  Vitals:   11/29/18 1530 11/29/18 1541  BP: Marland Kitchen)  99/39 (!) 104/42  Pulse: 86 87  Resp:  20  Temp:  98.3 F (36.8 C)  SpO2:  98%   Body mass index is 48.44 kg/m.  General:  WDWN confused on HD Gait: Not observed Pulmonary: normal non-labored breathing Cardiac: regular Abdomen: soft, NT/ND, no masses Skin: without rashes Vascular Exam/Pulses: palpable radial pulses Extremities: without ischemic changes, without Gangrene , without cellulitis; without open wounds; no palpable thrill or audible bruit over fistula in left arm; left arm incisions well-healed Musculoskeletal: no muscle wasting or atrophy Neurologic: confused Lymph:  Unremarkable  CBC    Component Value Date/Time   WBC 14.6 (H) 11/29/2018 1310   RBC 3.47 (L) 11/29/2018 1310   HGB 10.4 (L) 11/29/2018 1310   HCT 33.9 (L) 11/29/2018 1310   PLT 161 11/29/2018 1310   MCV 97.7 11/29/2018 1310   MCH 30.0 11/29/2018 1310   MCHC 30.7 11/29/2018 1310   RDW 14.2 11/29/2018 1310   LYMPHSABS 0.9 11/28/2018 0808   MONOABS 1.3 (H) 11/28/2018 0808   EOSABS 0.0 11/28/2018 0808   BASOSABS 0.1 11/28/2018 0808    BMET    Component Value Date/Time   NA 129 (L) 11/29/2018 1301   K 5.4 (H) 11/29/2018 1301   CL 88 (L) 11/29/2018 1301   CO2 24 11/29/2018 1301   GLUCOSE 197 (H) 11/29/2018 1301   BUN 64 (H) 11/29/2018 1301   CREATININE 11.43 (H)  11/29/2018 1301   CALCIUM 7.4 (L) 11/29/2018 1301   CALCIUM 7.7 (L) 08/12/2015 0738   GFRNONAA 4 (L) 11/29/2018 1301   GFRAA 5 (L) 11/29/2018 1301    COAGS: Lab Results  Component Value Date   INR 1.02 11/06/2017   INR 1.32 07/14/2015   INR 1.25 07/12/2015     ASSESSMENT/PLAN: This is a 53 y.o. male with MSSA bacteremia; suspected infected TDC  - Plan is for removal of right IJ tunneled dialysis catheter after hemodialysis treatment tomorrow 11/30/2018 with replacement of catheter, after catheter holiday, on Monday 12/03/2018 - Venogram performed 10/2018 demonstrates an occluded innominate vein; will attempt for placement of TDC in right IJ however patient may need left sided TDC - Left arm basilic vein fistula also likely occluded based on physical exam - We will allow bacteremia to clear prior to discussing elective surgery for new access left arm fistula versus graft - Explained risks and benefits of TDC removal; consent to be obtained - Case will be discussed with Dr. Trula Slade who will evaluate the patient later today  Dagoberto Ligas PA-C [facility] [phone]

## 2018-11-29 NOTE — Progress Notes (Signed)
PT Cancellation Note  Patient Details Name: Seth [name] MRN: [id number] DOB: October 10, 1966   Cancelled Treatment:    Reason Eval/Treat Not Completed: Patient at procedure or test/unavailable Patient at HD  Reinaldo Berber, [place], DPT Acute Rehabilitation Services Pager: [phone] Office: [place] 11/29/2018, 1:29 PM

## 2018-11-29 NOTE — Progress Notes (Signed)
[facility] Progress Note  Subjective: 3.5 L off on HD yesterday, still looks SOB and confused today  Vitals:   11/29/18 1100 11/29/18 1230 11/29/18 1241 11/29/18 1300  BP: (!) 119/48 (!) 98/32 (!) 127/53 (!) 85/44  Pulse: (!) 111 (!) 110 (!) 111 (!) 111  Resp: (!) 25 (!) 22    Temp: 99 F (37.2 C) 100 F (37.8 C)    TempSrc:  Oral    SpO2: 97% 96%    Weight:  (!) 142.9 kg    Height:        Inpatient medications: . aspirin EC  81 mg Oral Daily  . atorvastatin  40 mg Oral q1800  . chlorhexidine  15 mL Mouth Rinse BID  . Chlorhexidine Gluconate Cloth  6 each Topical Q0600  . ferric citrate  630 mg Oral TID WC  . heparin      . heparin  5,000 Units Subcutaneous Q8H  . insulin aspart  0-5 Units Subcutaneous QHS  . insulin aspart  0-9 Units Subcutaneous TID WC  . mouth rinse  15 mL Mouth Rinse q12n4p   . albuterol Stopped (11/28/18 1100)  . calcium chloride  IV Stopped (11/28/18 1100)  .  ceFAZolin (ANCEF) IV     ferric citrate, heparin, heparin, oxyCODONE-acetaminophen **AND** oxyCODONE  Iron/TIBC/Ferritin/ %Sat    Component Value Date/Time   IRON 193 (H) 07/04/2015 1800   TIBC 221 (L) 07/04/2015 1800   FERRITIN 129 04/30/2012 1233   IRONPCTSAT 87 (H) 07/04/2015 1800    Exam: Gen on FM O2, not in distress, slightly ^wob +JVD Chest coarse rhonchi, bibasilar rales, occ wheezing RRR no MRG Abd soft ntnd no mass or ascites +bs very obese Ext no LE edema Neuro is alert, Ox 3 , nf R IJ TDC exit site c/d, old access scars bilat UE's   Home meds:  - amlodipine 10  - aspirin 81/ atorvastatin 40 hs  - insulin aspart 5- 15 u tid ac  - auryxia 630 ac tid/ oxy-aceta 10-325 tid prn   Dialysis: [facility] MWF [address]  4h   138.5kg   Heparin 4500 + 1000/hr   R IJ TDC    Assessment: 1. SOB/ hypoxemia - mild edema on CXR. HD yest 3.5 L off, needs extra HD today. Will get labs on HD as difficult stick. Cont to lower volume as tolerated.   2. Hyperkalemia - persistent 3. +MSSA bacteremia - may be due to HD cath infection. On IV abx. Will plan on HD today and tomorrow am.  Will consult [facility] for removal of TDC after HD tomorrow w/ replacement on Monday.  4. DM2 on insulin  5. HTN cont meds 6. 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 HD access:  Had new LUE AVF 10/23/18 here. Prior access R arm AVF recently failed late 2019.      P: 1. As above.       [facility] 11/29/2018, 1:17 PM  Recent Labs  Lab 11/28/18 0808  11/28/18 2220 11/29/18 0146  NA 124*   < > 131* 130*  K 7.1*   < > 5.4* 5.6*  CL 85*   < > 90* 90*  CO2 17*   < > 25 22  GLUCOSE 298*   < > 149* 156*  BUN 90*   < > 51* 54*  CREATININE 13.88*   < > 9.93* 10.67*  CALCIUM 8.5*   < > 8.2* 8.1*  ALBUMIN  3.1*  --   --   --    < > = values in this interval not displayed.   Recent Labs  Lab 11/28/18 0808  AST 142*  ALT 78*  ALKPHOS 91  BILITOT 1.7*  PROT 8.2*   Recent Labs  Lab 11/28/18 0808  11/28/18 1924 11/29/18 1310  WBC 17.4*  --  16.8* 14.6*  NEUTROABS 15.0*  --   --   --   HGB 10.8*   < > 11.3* 10.4*  HCT 35.2*   < > 36.5* 33.9*  MCV 97.0  --  96.8 97.7  PLT 175  --  173 161   < > = values in this interval not displayed.

## 2018-11-29 NOTE — Progress Notes (Signed)
  Date: 11/29/2018  Patient name: Seth [name]  Medical record number: [id number]  Date of birth: 03-26-1966   I have seen and evaluated this patient and I have discussed the plan of care with the house staff. Please see their note for complete details. I concur with their findings with the following additions/corrections:   Please see my separate attestation of the H&P from 11/28/2018.  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  He will need imaging of both his back and his knee, evidently he has required anxiolytics for MRI in the past, will try to obtain these tomorrow if his hypercapnic respiratory failure has improved by then.  Lenice Pressman, M.D., Ph.D. 11/29/2018, 2:01 PM

## 2018-11-29 NOTE — Progress Notes (Addendum)
Pharmacy Antibiotic Note  Seth [name] is a 53 y.o. male admitted on 11/28/2018 with CAP, now BCID shows MSSA bacteremia.  Pharmacy has been consulted for Ancef dosing.  Plan: Ancef 2gm now then qHD Will f/u HD schedule and pt's clinical condition  Height: 5\' 7"  (170.2 cm) Weight: (!) 320 lb (145.2 kg) IBW/kg (Calculated) : 66.1  Temp (24hrs), Avg:99.8 F (37.7 C), Min:98.4 F (36.9 C), Max:100.7 F (38.2 C)  Recent Labs  Lab 11/28/18 0808 11/28/18 1924 11/28/18 2220  WBC 17.4* 16.8*  --   CREATININE 13.88* 9.59* 9.93*  LATICACIDVEN 1.8  --   --     Estimated Creatinine Clearance: 11.8 mL/min (A) (by C-G formula based on SCr of 9.93 mg/dL (H)).    No Known Allergies  Antimicrobials this admission: 2/19 Azith>>2/20 2/19 Rocephin>>2/20 2/20 Ancef>>  Microbiology results: 2/19 BCx: MSSA 2/19 MRSA PCR: negative  Thank you for allowing pharmacy to be a part of this patient's care.  Sherlon Handing, PharmD, BCPS Clinical pharmacist  **Pharmacist phone directory can now be found on [email or web address] ([email or web address]).  Listed under [place]. 11/29/2018 12:32 AM  PHARMACY - PHYSICIAN COMMUNICATION CRITICAL VALUE ALERT - BLOOD CULTURE IDENTIFICATION (BCID)  Seth [name] is an 53 y.o. male who presented to [facility] on 11/28/2018 with a chief complaint of CAP  Assessment:  Pt with 2/4 blood cultures growing MSSA - lung is likely source  Name of physician (or Provider) Contacted: Dr. Harlow Ohms  Current antibiotics: Ceftriaxone 1gm q24h and Azithromycin 500mg  IV q24h  Changes to prescribed antibiotics recommended:  MD narrowing abx to Ancef per pharmacy. ID will also automatically be consulted.  Results for orders placed or performed during the hospital encounter of 11/28/18  Blood Culture ID Panel (Reflexed) (Collected: 11/28/2018  8:08 AM)  Result Value Ref Range   Enterococcus species NOT DETECTED NOT DETECTED   Listeria monocytogenes NOT DETECTED NOT DETECTED   Staphylococcus species DETECTED (A) NOT DETECTED   Staphylococcus aureus (BCID) DETECTED (A) NOT DETECTED   Methicillin resistance NOT DETECTED NOT DETECTED   Streptococcus species NOT DETECTED NOT DETECTED   Streptococcus agalactiae NOT DETECTED NOT DETECTED   Streptococcus pneumoniae NOT DETECTED NOT DETECTED   Streptococcus pyogenes NOT DETECTED NOT DETECTED   Acinetobacter baumannii NOT DETECTED NOT DETECTED   Enterobacteriaceae species NOT DETECTED NOT DETECTED   Enterobacter cloacae complex NOT DETECTED NOT DETECTED   Escherichia coli NOT DETECTED NOT DETECTED   Klebsiella oxytoca NOT DETECTED NOT DETECTED   Klebsiella pneumoniae NOT DETECTED NOT DETECTED   Proteus species NOT DETECTED NOT DETECTED   Serratia marcescens NOT DETECTED NOT DETECTED   Haemophilus influenzae NOT DETECTED NOT DETECTED   Neisseria meningitidis NOT DETECTED NOT DETECTED   Pseudomonas aeruginosa NOT DETECTED NOT DETECTED   Candida albicans NOT DETECTED NOT DETECTED   Candida glabrata NOT DETECTED NOT DETECTED   Candida krusei NOT DETECTED NOT DETECTED   Candida parapsilosis NOT DETECTED NOT DETECTED   Candida tropicalis NOT DETECTED NOT DETECTED    Sherlon Handing, PharmD, BCPS Clinical pharmacist  **Pharmacist phone directory can now be found on [email or web address] (PW [email or web address]).  Listed under [place]. 11/29/2018  12:27 AM

## 2018-11-29 NOTE — Progress Notes (Addendum)
[facility] Progress Note  Subjective: 3.5 L off on HD yesterday, still looks SOB and confused today  Vitals:   11/29/18 0520 11/29/18 0545 11/29/18 0756 11/29/18 0800  BP:   (!) 143/81 (!) 143/81  Pulse: 89  100 100  Resp: (!) 27  (!) 22   Temp:   100.2 F (37.9 C) 100.2 F (37.9 C)  TempSrc:      SpO2: (!) 89% 98% 99% 99%  Weight:      Height:        Inpatient medications: . aspirin EC  81 mg Oral Daily  . atorvastatin  40 mg Oral q1800  . chlorhexidine  15 mL Mouth Rinse BID  . Chlorhexidine Gluconate Cloth  6 each Topical Q0600  . ferric citrate  630 mg Oral TID WC  . heparin  5,000 Units Subcutaneous Q8H  . insulin aspart  0-5 Units Subcutaneous QHS  . insulin aspart  0-9 Units Subcutaneous TID WC  . mouth rinse  15 mL Mouth Rinse q12n4p  . pneumococcal 23 valent vaccine  0.5 mL Intramuscular Tomorrow-1000   . sodium chloride    . albuterol Stopped (11/28/18 1100)  . calcium chloride  IV Stopped (11/28/18 1100)  . [START ON 12/01/2018]  ceFAZolin (ANCEF) IV    . dextrose 5 % and 0.45% NaCl 10 mL/hr at 11/29/18 0659   ferric citrate, heparin, oxyCODONE-acetaminophen **AND** oxyCODONE  Iron/TIBC/Ferritin/ %Sat    Component Value Date/Time   IRON 193 (H) 07/04/2015 1800   TIBC 221 (L) 07/04/2015 1800   FERRITIN 129 04/30/2012 1233   IRONPCTSAT 87 (H) 07/04/2015 1800    Exam: Gen on FM O2, not in distress, slightly ^wob +JVD Chest coarse rhonchi, bibasilar rales, occ wheezing RRR no MRG Abd soft ntnd no mass or ascites +bs very obese Ext no LE edema Neuro is alert, Ox 3 , nf R IJ TDC exit site c/d, old access scars bilat UE's   Home meds:  - amlodipine 10  - aspirin 81/ atorvastatin 40 hs  - insulin aspart 5- 15 u tid ac  - auryxia 630 ac tid/ oxy-aceta 10-325 tid prn   Dialysis: [facility] MWF [address]  4h   138.5kg   Heparin 4500 + 1000/hr   R IJ TDC   Assessment: 1. SOB/ hypoxemia - mild edema on CXR. HD yest 3.5 L  off, needs extra HD today. Will get labs on HD as difficult stick.  2. Hyperkalemia - persistent 3. +MSSA bacteremia - may be due to HD cath infection. On IV abx.  4. DM2 on insluin  5. HTN cont meds 6. 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 HD access:  Had new LUE AVF 10/23/18 here. Prior access R arm AVF recently failed late 2019.      P: 1. As above.       [facility] 11/29/2018, 9:26 AM  Recent Labs  Lab 11/28/18 0808  11/28/18 2220 11/29/18 0146  NA 124*   < > 131* 130*  K 7.1*   < > 5.4* 5.6*  CL 85*   < > 90* 90*  CO2 17*   < > 25 22  GLUCOSE 298*   < > 149* 156*  BUN 90*   < > 51* 54*  CREATININE 13.88*   < > 9.93* 10.67*  CALCIUM 8.5*   < > 8.2* 8.1*  ALBUMIN 3.1*  --   --   --    < > =  values in this interval not displayed.   Recent Labs  Lab 11/28/18 0808  AST 142*  ALT 78*  ALKPHOS 91  BILITOT 1.7*  PROT 8.2*   Recent Labs  Lab 11/28/18 0808 11/28/18 0959 11/28/18 1924  WBC 17.4*  --  16.8*  NEUTROABS 15.0*  --   --   HGB 10.8* 12.9* 11.3*  HCT 35.2* 38.0* 36.5*  MCV 97.0  --  96.8  PLT 175  --  173

## 2018-11-29 NOTE — Progress Notes (Addendum)
  Echocardiogram 2D Echocardiogram has been performed.  Poor patient compliance.  Technically difficult study due to body habitus.  Patient refused to finish exam.   Seth [name] L Androw 11/29/2018, 12:13 PM

## 2018-11-30 ENCOUNTER — Encounter (HOSPITAL_COMMUNITY): Payer: Self-pay

## 2018-11-30 ENCOUNTER — Encounter: Payer: Self-pay | Admitting: Vascular Surgery

## 2018-11-30 DIAGNOSIS — E871 Hypo-osmolality and hyponatremia: Secondary | ICD-10-CM

## 2018-11-30 DIAGNOSIS — N185 Chronic kidney disease, stage 5: Secondary | ICD-10-CM

## 2018-11-30 DIAGNOSIS — I214 Non-ST elevation (NSTEMI) myocardial infarction: Secondary | ICD-10-CM

## 2018-11-30 DIAGNOSIS — F4310 Post-traumatic stress disorder, unspecified: Secondary | ICD-10-CM

## 2018-11-30 LAB — BASIC METABOLIC PANEL
Anion gap: 16 — ABNORMAL HIGH (ref 5–15)
BUN: 43 mg/dL — ABNORMAL HIGH (ref 6–20)
CO2: 24 mmol/L (ref 22–32)
Calcium: 7.9 mg/dL — ABNORMAL LOW (ref 8.9–10.3)
Chloride: 91 mmol/L — ABNORMAL LOW (ref 98–111)
Creatinine, Ser: 8.3 mg/dL — ABNORMAL HIGH (ref 0.61–1.24)
GFR calc Af Amer: 8 mL/min — ABNORMAL LOW (ref >60)
GFR calc non Af Amer: 7 mL/min — ABNORMAL LOW (ref >60)
Glucose, Bld: 183 mg/dL — ABNORMAL HIGH (ref 70–99)
Potassium: 4.9 mmol/L (ref 3.5–5.1)
Sodium: 131 mmol/L — ABNORMAL LOW (ref 135–145)

## 2018-11-30 LAB — CBC
HCT: 36.6 % — ABNORMAL LOW (ref 39.0–52.0)
Hemoglobin: 10.9 g/dL — ABNORMAL LOW (ref 13.0–17.0)
MCH: 29.2 pg (ref 26.0–34.0)
MCHC: 29.8 g/dL — ABNORMAL LOW (ref 30.0–36.0)
MCV: 98.1 fL (ref 80.0–100.0)
Platelets: 158 10*3/uL (ref 150–400)
RBC: 3.73 MIL/uL — ABNORMAL LOW (ref 4.22–5.81)
RDW: 14.3 % (ref 11.5–15.5)
WBC: 14.2 10*3/uL — ABNORMAL HIGH (ref 4.0–10.5)
nRBC: 0.1 % (ref 0.0–0.2)

## 2018-11-30 LAB — GLUCOSE, CAPILLARY
Glucose-Capillary: 252 mg/dL — ABNORMAL HIGH (ref 70–99)
Glucose-Capillary: 248 mg/dL — ABNORMAL HIGH (ref 70–99)

## 2018-11-30 MED ORDER — CEFAZOLIN SODIUM-DEXTROSE 2-4 GM/100ML-% IV SOLN
2.0000 g | INTRAVENOUS | Status: DC
  Filled 2018-11-30: qty 100

## 2018-11-30 MED ORDER — HEPARIN SODIUM (PORCINE) 1000 UNIT/ML IJ SOLN
2000.0000 [IU] | INTRAMUSCULAR | Status: DC | PRN
  Filled 2018-11-30: qty 2

## 2018-11-30 MED ORDER — CEFAZOLIN SODIUM-DEXTROSE 1-4 GM/50ML-% IV SOLN
1.0000 g | INTRAVENOUS | Status: DC
  Administered 2018-11-30 – 2018-12-02 (×3): 1 g via INTRAVENOUS
  Filled 2018-11-30 (×5): qty 50

## 2018-11-30 MED ORDER — LIDOCAINE HCL (PF) 2 % IJ SOLN
0.0000 mL | Freq: Once | INTRAMUSCULAR | Status: DC | PRN
  Filled 2018-11-30: qty 20

## 2018-11-30 MED ORDER — INSULIN ASPART 100 UNIT/ML ~~LOC~~ SOLN
0.0000 [IU] | Freq: Three times a day (TID) | SUBCUTANEOUS | Status: DC
  Administered 2018-11-30: 5 [IU] via SUBCUTANEOUS
  Administered 2018-11-30: 8 [IU] via SUBCUTANEOUS
  Administered 2018-12-01: 5 [IU] via SUBCUTANEOUS
  Administered 2018-12-01 (×2): 8 [IU] via SUBCUTANEOUS
  Administered 2018-12-02 (×2): 11 [IU] via SUBCUTANEOUS
  Administered 2018-12-02: 5 [IU] via SUBCUTANEOUS
  Administered 2018-12-03: 15 [IU] via SUBCUTANEOUS
  Administered 2018-12-04: 5 [IU] via SUBCUTANEOUS
  Administered 2018-12-04: 8 [IU] via SUBCUTANEOUS
  Administered 2018-12-04: 3 [IU] via SUBCUTANEOUS
  Administered 2018-12-05 – 2018-12-06 (×3): 5 [IU] via SUBCUTANEOUS
  Administered 2018-12-06 (×2): 3 [IU] via SUBCUTANEOUS
  Administered 2018-12-07: 8 [IU] via SUBCUTANEOUS
  Administered 2018-12-08 (×2): 3 [IU] via SUBCUTANEOUS

## 2018-11-30 MED ORDER — HEPARIN SODIUM (PORCINE) 1000 UNIT/ML DIALYSIS
2000.0000 [IU] | INTRAMUSCULAR | Status: DC | PRN
  Filled 2018-11-30: qty 2

## 2018-11-30 MED ORDER — CHLORHEXIDINE GLUCONATE CLOTH 2 % EX PADS
6.0000 | MEDICATED_PAD | Freq: Every day | CUTANEOUS | Status: DC
  Administered 2018-12-02: 6 via TOPICAL

## 2018-11-30 MED ORDER — HEPARIN SODIUM (PORCINE) 1000 UNIT/ML IJ SOLN
INTRAMUSCULAR | Status: AC
  Administered 2018-11-30: 4000 [IU] via INTRAVENOUS_CENTRAL
  Filled 2018-11-30: qty 6

## 2018-11-30 NOTE — Progress Notes (Signed)
Pharmacy Antibiotic Note  Seth [name] is a 53 y.o. male admitted on 11/28/2018 with CAP, now BCID shows MSSA bacteremia.  Pharmacy has been consulted for Ancef dosing.  Changed to ancef 1gm IV q24h for weekend per peripheral line (TDC to be removed 2/21 and replaced on Monday 2/24). Once regular HD schedule restarted would change back to dosing with HD.  Plan: Change Ancef to 1gm IV q24h until back on regular HD schedule Will f/u HD schedule and pt's clinical condition  Height: 5\' 7"  (170.2 cm) Weight: (!) 307 lb 1.6 oz (139.3 kg) IBW/kg (Calculated) : 66.1  Temp (24hrs), Avg:98.4 F (36.9 C), Min:96.6 F (35.9 C), Max:100 F (37.8 C)  Recent Labs  Lab 11/28/18 0808 11/28/18 1924 11/28/18 2220 11/29/18 0146 11/29/18 1301 11/29/18 1310 11/30/18 0251  WBC 17.4* 16.8*  --   --   --  14.6* 14.2*  CREATININE 13.88* 9.59* 9.93* 10.67* 11.43*  --  8.30*  LATICACIDVEN 1.8  --   --   --   --   --   --     Estimated Creatinine Clearance: 13.7 mL/min (A) (by C-G formula based on SCr of 8.3 mg/dL (H)).    No Known Allergies  Antimicrobials this admission: 2/19 Azith>>2/20 2/19 Rocephin>>2/20 2/20 Ancef>>  Microbiology results: 2/19 BCx: MSSA 2/19 MRSA PCR: negative  Seth [name] A. Levada Dy, PharmD, [place] Pager: [phone] Please utilize Amion for appropriate phone number to reach the unit pharmacist ([place])

## 2018-11-30 NOTE — Procedures (Signed)
PRE-OPERATIVE DIAGNOSIS:   ESRD, suspected infected TDC.   POST-OPERATIVE DIAGNOSIS:  same.  PROCEDURE:  Removal of Right IJ perm cath.   PROVIDER:  Dagoberto Ligas PA-C  ANESTHESIA:  Local.  The risks and benefits of this procedure were explained to the patient and the patient consented.   OPERATIVE PROCEDURE:  The following procedure was performed at the bedside.  The right side of patient's neck and chest were prepped and draped in standard fashion.  Local anesthesia was infiltrated over the tunneled catheter and its cuff.  The cuff was loosened using a combination of blunt and sharp dissection.  The catheter was removed in its entirety, and hemostasis was achieved with local compression.    The patient tolerated the procedure well and did not have any complications.  RECOMMENDATIONS:   Catheter tip sent for culture.  Catheter exit site was dressed with dry dressing.  Discussed with RN where to hold pressure if bleeding is noted from catheter exit site.  Patient will be scheduled for new TDC insertion Monday 12/03/18 as long as blood cultures are negative.   Dagoberto Ligas, PA-C [facility] [phone] 11/30/2018  2:48 PM

## 2018-11-30 NOTE — Progress Notes (Signed)
RT placed pt on CPAP dream station for the night. Pt tolerating well. Pt on auto titrate high 20 low 8  With 6 lpm bled into the system. RT will continue to monitor.

## 2018-11-30 NOTE — Progress Notes (Signed)
PT Cancellation Note  Patient Details Name: Seth [name] MRN: [id number] DOB: 1965-12-25   Cancelled Treatment:    Reason Eval/Treat Not Completed: Patient at procedure or test/unavailable. Pt in HD. Plan for removal of dialysis catheter upon return to room. PT to re-attempt eval as time allows.   Lorriane Shire 11/30/2018, 8:37 AM   Lorrin Goodell, PT  Office # [phone] Pager [phone]

## 2018-11-30 NOTE — Progress Notes (Signed)
  Date: 11/30/2018  Patient name: Seth [name]  Medical record number: [id number]  Date of birth: [birth year]   I have seen and evaluated this patient and I have discussed the plan of care with the house staff. Please see their note for complete details. I concur with their findings with the following additions/corrections:   Seen on rounds with the team this morning and dialysis.  He reports feeling well and looks more energetic today.  He thinks he slept well using the CPAP machine and would like to get 1 for use at home.  As noted by Dr. Donne Hazel, we also had an extensive discussion about his PTSD from his time in combat during operation [facility].  We encouraged him to look into [place] PTSD programs.  Respiratory status has improved significantly with fluid removal by UF.  RN reports he is still desatted significantly with any movement and also had some desats noted overnight during sleep, likely related to OSA.  He may require BiPAP instead of CPAP overnight given his obesity combined with OSA.  Ideally, we would get another blood gas soon to reevaluate his hypercapnia.  His acid-base status overall has improved significantly, with a normal bicarb now.  On last check, he still had a mildly elevated beta hydroxybutyrate, but I believe his DKA has largely resolved.  He was started on long-acting insulin and will continue with short acting insulin.  We may need to give him a few extra units of insulin to ensure we clear those ketones.  Plan for temporary dialysis catheter removal today.  We will continue antibiotics for his MSSA bacteremia and follow blood cultures to ensure clearance before placement of a new dialysis catheter.  Evidently, his recently placed fistula is occluded, and prior venogram showed occluded innominate vein, complicating access for him.  He will need a TEE as well as MRI of the back to evaluate for MSSA embolism.  ID team was also concerned about his right knee, but on his  description to [place] today, he feels it is consistent with his chronic pain.  There is some evidence of mild effusion of the knee, but it is difficult to evaluate given his obesity.  We can look with point-of-care ultrasound later today.  Troponins have peaked, likely type II NSTEMI, no further intervention needed at this time, but would not plan ischemic evaluation in the near future.  Lenice Pressman, M.D., Ph.D. 11/30/2018, 11:53 AM

## 2018-11-30 NOTE — Progress Notes (Signed)
Subjective: Seen in HD. Has more energy today. He reports his knee pain as chronic and unchanged. His breathing and sleep is much better when he wears the face mask instead of nasal cannula. Long discussion about PTSD. Provided a listening ear and encouraged him to continue participating in the [place] programs. Discussed plan for continued antibiotics and removal of right IJ catheter today after HD.   Objective:  Vital signs in last 24 hours: Vitals:   11/30/18 0830 11/30/18 0900 11/30/18 0930 11/30/18 1000  BP: (!) 121/56 (!) 153/68 (!) 104/54 124/64  Pulse: 79 80 81 81  Resp: 17 18 18 18   Temp:      TempSrc:      SpO2: 98%     Weight:      Height:       Gen: obese male laying in bed, NAD, alert and talkative Cardiac: RRR, difficult to appreciate any m/r/g due to body habitus MSK: right knee mildly more swollen around the patellar tendon compared to the left. No warmth, redness, or tenderness appreciated.   Antibiotics: Azithromycin 2/19-2/19 Ceftriaxone 2/19-2/19 Cefazolin 2/20 - present   Micro:  BCx right antecubital: Staph aureus, pan sensitive  BCx right hand: Staph aureus, sensitivities pending   Assessment/Plan:  Principal Problem:   Bacteremia due to methicillin susceptible Staphylococcus aureus (MSSA) Active Problems:   Hyperlipidemia associated with type 2 diabetes mellitus ([place])   Morbid obesity ([place])   Hypertension associated with diabetes ([place])   End stage renal disease on dialysis ([name])   OSA (obstructive sleep apnea)   Hyperkalemia   NSTEMI (non-ST elevated myocardial infarction) ([place])   Acute respiratory failure with hypoxia and hypercapnia ([name])   Hyponatremia  [name] with HTN, hyperlipidemia, type 2 diabetes, ESRD on MWF HD, OSA on cpap, who presented with hyperkalemia 7.1 after missing one HD session, MSSA bacteremia, acute hypoxic respiratory failure with respiratory acidosis and mixed anion gap metabolic acidosis.   ESRD on HD, hyperkalemia:  Hyperkalemia resolved after serial HD. Continue HD today. Remove right IJ catheter after HD today and give dialysis holiday over the weekend. Will repeat blood cultures after catheter is removed and if negative, plan to replace catheter on Monday to continue HD. Appreciate nephro and vascular surgery's assistance.   MSSA Bacteremia:  Afebrile, leukocytosis slowly improving. Initially hypotensive on admission but that is improving today as well. Unable to get repeat lactate. Most likely source is right IJ catheter. Concern for osteomyelitis given back and right knee pain. However, right knee pain sounds chronic and stable. TTE attempted but unable to complete because Mr. Kobashigawa could not tolerate it. Will try for TEE next week and MR spine +/- right knee next week once respiratory status improves. He requires ativan for the MR.  - appreciate ID and nephrology's assistance  - continue cefazolin - f/u BCx sensitivities  - MR thoracic and lumbar spine +/- right knee once patient is more stable - remove right IJ catheter today   Anion Gap Metabolic Acidosis: Improving. Metabolic acidosis has resolved. Anion gap remains elevated. Unable to get repeat lactate. Repeat beta hydroxybutyrate is improved but still slightly elevated. Low magnesium and high phosphorus can also contribute to elevated anion gap.  - lantus 3U qhs - increase SSI from sensitive to moderate - HD as above - repeat BMP and beta hydroxybutyrate tomorrow  - check Mg and phosphorus with am labs  Respiratory Acidosis: initial VBG pH 7.1, pCO2 64, bicarb 24. Suspect mixed respiratory and metabolic acidosis. He is  alert and awake this morning, satting well on [place]. Unable to get repeat VBG, not sure that it would change management at this point anyways.  - bipap overnight for pulmonary edema on top of OSA, OHS, and mouth breathing - [place] during the day   Possible CAP: repeat CXR shows mildly improved pulmonary edema. No focal consolidations.  Afebrile, leukocytosis improving. I do not think he has a pneumonia.   - continue current antibiotic for bacteremia.   Elevated Troponins: 0.30 on admission, peaked at 0.75 and down trended. Most likely demand ischemia, but definitely has risk factors for CAD.  No active chest pain. No prior heart caths. Last echo in 2016 showed normal EF, g1dd, without wall motion abnormalities.  - no need to continue trending - if active chest pain, repeat EKG and troponin   HTN: holding home amlodipine HLD: continue home statin Chronic pain: continue home oxycodone-acetaminophen with hold parameters   Dispo: Anticipated discharge in several days   Isabelle Course, MD 11/30/2018, 10:09 AM Pager: [phone]

## 2018-11-30 NOTE — Progress Notes (Signed)
Catheter removed today. We will plan for placement of a new catheter, likely from the left side on Monday.  The patient will be vein mapped over the weekend to see if he has any options for fistula creation on the left.  More than likely he is going to require a graft, therefore I would make sure all of his infectious issues have resolved before planning permanent access.  We will just place a catheter on Monday.  He will need to be n.p.o. after midnight on Sunday.  Annamarie Major

## 2018-11-30 NOTE — Progress Notes (Signed)
[facility] Progress Note  Subjective: more alert daily w/ serial HD.  3L off yest and again 3L off today. Unable to stand for wts.   Vitals:   11/30/18 0900 11/30/18 0930 11/30/18 1000 11/30/18 1016  BP: (!) 153/68 (!) 104/54 124/64 101/62  Pulse: 80 81 81 79  Resp: 18 18 18  (!) 21  Temp:    98.5 F (36.9 C)  TempSrc:    Oral  SpO2:    100%  Weight:      Height:        Inpatient medications: . aspirin EC  81 mg Oral Daily  . atorvastatin  40 mg Oral q1800  . chlorhexidine  15 mL Mouth Rinse BID  . Chlorhexidine Gluconate Cloth  6 each Topical Q0600  . ferric citrate  630 mg Oral TID WC  . heparin  5,000 Units Subcutaneous Q8H  . insulin aspart  0-15 Units Subcutaneous TID WC  . insulin aspart  0-5 Units Subcutaneous QHS  . insulin glargine  3 Units Subcutaneous QHS  . mouth rinse  15 mL Mouth Rinse q12n4p   . albuterol Stopped (11/28/18 1100)  .  ceFAZolin (ANCEF) IV     ferric citrate, heparin, lidocaine, oxyCODONE-acetaminophen **AND** oxyCODONE  Iron/TIBC/Ferritin/ %Sat    Component Value Date/Time   IRON 193 (H) 07/04/2015 1800   TIBC 221 (L) 07/04/2015 1800   FERRITIN 129 04/30/2012 1233   IRONPCTSAT 87 (H) 07/04/2015 1800    Exam: Gen on FM O2, not in distress, slightly Seth [name] +JVD Chest coarse rhonchi, bibasilar rales, occ wheezing RRR no MRG Abd soft ntnd no mass or ascites +bs very obese Ext no LE edema Neuro is alert, Ox 3 , nf R IJ TDC exit site c/d, old access scars bilat UE's   Home meds:  - amlodipine 10  - aspirin 81/ atorvastatin 40 hs  - insulin aspart 5- 15 u tid ac  - auryxia 630 ac tid/ oxy-aceta 10-325 tid prn   Dialysis: [facility] MWF [address]  4h   138.5kg   Heparin 4500 + 1000/hr   R IJ TDC    Assessment/ Plan: 1. SOB/ hypoxemia - +CHF/ vol overload, 9 L off w/ HD last 3d including today. Breathing better. Down to dry wt.  2. Hyperkalemia - improved 3. ESRD on HD MWF: HD today on schedule.   4. +MSSA bacteremia - IV abx, needs HD cath removed/ replaced, consulted VVS for this.  5. DM2 on insulin  6. HTN cont meds 7. 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 HD access: R arm AVF recently failed late 2019, then had new LUE AVF 10/23/18 but this also looks occluded.  VVS is evaluating this as well.    [facility] 11/30/2018, 11:02 AM  Recent Labs  Lab 11/28/18 0808  11/29/18 1301 11/30/18 0251  NA 124*   < > 129* 131*  K 7.1*   < > 5.4* 4.9  CL 85*   < > 88* 91*  CO2 17*   < > 24 24  GLUCOSE 298*   < > 197* 183*  BUN 90*   < > 64* 43*  CREATININE 13.88*   < > 11.43* 8.30*  CALCIUM 8.5*   < > 7.4* 7.9*  PHOS  --   --  5.8*  --   ALBUMIN 3.1*  --  2.8*  --    < > = values in this interval not displayed.  Recent Labs  Lab 11/28/18 0808  AST 142*  ALT 78*  ALKPHOS 91  BILITOT 1.7*  PROT 8.2*   Recent Labs  Lab 11/28/18 0808  11/29/18 1310 11/30/18 0251  WBC 17.4*   < > 14.6* 14.2*  NEUTROABS 15.0*  --   --   --   HGB 10.8*   < > 10.4* 10.9*  HCT 35.2*   < > 33.9* 36.6*  MCV 97.0   < > 97.7 98.1  PLT 175   < > 161 158   < > = values in this interval not displayed.

## 2018-12-01 LAB — BASIC METABOLIC PANEL
Anion gap: 17 — ABNORMAL HIGH (ref 5–15)
BUN: 54 mg/dL — ABNORMAL HIGH (ref 6–20)
CO2: 24 mmol/L (ref 22–32)
Calcium: 7.9 mg/dL — ABNORMAL LOW (ref 8.9–10.3)
Chloride: 87 mmol/L — ABNORMAL LOW (ref 98–111)
Creatinine, Ser: 7.5 mg/dL — ABNORMAL HIGH (ref 0.61–1.24)
GFR calc Af Amer: 9 mL/min — ABNORMAL LOW (ref >60)
GFR calc non Af Amer: 7 mL/min — ABNORMAL LOW (ref >60)
Glucose, Bld: 306 mg/dL — ABNORMAL HIGH (ref 70–99)
Potassium: 4.6 mmol/L (ref 3.5–5.1)
Sodium: 128 mmol/L — ABNORMAL LOW (ref 135–145)

## 2018-12-01 LAB — CULTURE, BLOOD (ROUTINE X 2)
SPECIAL REQUESTS: ADEQUATE
Special Requests: ADEQUATE

## 2018-12-01 LAB — PHOSPHORUS: Phosphorus: 5.2 mg/dL — ABNORMAL HIGH (ref 2.5–4.6)

## 2018-12-01 LAB — CBC
HCT: 35.8 % — ABNORMAL LOW (ref 39.0–52.0)
Hemoglobin: 11 g/dL — ABNORMAL LOW (ref 13.0–17.0)
MCH: 30 pg (ref 26.0–34.0)
MCHC: 30.7 g/dL (ref 30.0–36.0)
MCV: 97.5 fL (ref 80.0–100.0)
Platelets: 189 10*3/uL (ref 150–400)
RBC: 3.67 MIL/uL — ABNORMAL LOW (ref 4.22–5.81)
RDW: 14.4 % (ref 11.5–15.5)
WBC: 12 10*3/uL — ABNORMAL HIGH (ref 4.0–10.5)
nRBC: 0.3 % — ABNORMAL HIGH (ref 0.0–0.2)

## 2018-12-01 LAB — BLOOD GAS, ARTERIAL
Acid-base deficit: 0.7 mmol/L (ref 0.0–2.0)
Bicarbonate: 25.2 mmol/L (ref 20.0–28.0)
Drawn by: 347191
O2 Content: 6 L/min
O2 Saturation: 81.4 %
Patient temperature: 98.4
pCO2 arterial: 54.6 mmHg — ABNORMAL HIGH (ref 32.0–48.0)
pH, Arterial: 7.285 — ABNORMAL LOW (ref 7.350–7.450)
pO2, Arterial: 49.7 mmHg — ABNORMAL LOW (ref 83.0–108.0)

## 2018-12-01 LAB — GLUCOSE, CAPILLARY
Glucose-Capillary: 341 mg/dL — ABNORMAL HIGH (ref 70–99)
Glucose-Capillary: 285 mg/dL — ABNORMAL HIGH (ref 70–99)
Glucose-Capillary: 235 mg/dL — ABNORMAL HIGH (ref 70–99)
Glucose-Capillary: 286 mg/dL — ABNORMAL HIGH (ref 70–99)
Glucose-Capillary: 312 mg/dL — ABNORMAL HIGH (ref 70–99)

## 2018-12-01 LAB — MAGNESIUM: Magnesium: 2.3 mg/dL (ref 1.7–2.4)

## 2018-12-01 LAB — BETA-HYDROXYBUTYRIC ACID: Beta-Hydroxybutyric Acid: 0.08 mmol/L (ref 0.05–0.27)

## 2018-12-01 MED ORDER — INSULIN GLARGINE 100 UNIT/ML ~~LOC~~ SOLN
5.0000 [IU] | Freq: Every day | SUBCUTANEOUS | Status: DC
  Administered 2018-12-01: 5 [IU] via SUBCUTANEOUS
  Filled 2018-12-01: qty 0.05

## 2018-12-01 NOTE — Progress Notes (Signed)
Order seen for d/c cardic monitoring but no change to level of care. Called MD to clarify. They will check with intern and let us know.

## 2018-12-01 NOTE — Progress Notes (Signed)
Subjective: No new complaints this morning. Upon walking in the room, his nasal canula was on his forehead and his oxygen was in the 70s. He said the oxygen was drying out his nose so he didn't like wearing it all the time. We will order him humidified oxygen. He did not wear cpap last night. Discussed home cpap more. He has a cpap machine at home but doesn't like how it fits so he doesn't wear it. Encourage him to wear the cpap in the hospital so we could figure out what settings his home cpap should be on. Discussed plan for continued antibiotics and on Monday will place another dialysis catheter. Continues to endorse back pain. Discussed plan to get MRI next week as well.   Objective:  Vital signs in last 24 hours: Vitals:   12/01/18 1000 12/01/18 1045 12/01/18 1100 12/01/18 1200  BP:    122/64  Pulse: 79 81 76 80  Resp: 18 (!) 34 (!) 23 (!) 24  Temp:      TempSrc:      SpO2: (!) 85% (S) (!) 60% 94%   Weight:      Height:       Gen: obese male laying in bed, NAD, alert and talkative Cardiac: RRR, difficult to appreciate any m/r/g due to body habitus Abd: soft, NT, ND  Antibiotics: Azithromycin 2/19-2/19 Ceftriaxone 2/19-2/19 Cefazolin 2/20 - present   Micro:  2/19 BC x2: Staph aureus, pan sensitive   2/21 Cath Tip culture: Stap aureus   2/22 repeat BC: in process  Assessment/Plan:  Principal Problem:   Bacteremia due to methicillin susceptible Staphylococcus aureus (MSSA) Active Problems:   Hyperlipidemia associated with type 2 diabetes mellitus ([place])   Morbid obesity ([place])   Hypertension associated with diabetes ([place])   End stage renal disease on dialysis ([place])   OSA (obstructive sleep apnea)   Hyperkalemia   NSTEMI (non-ST elevated myocardial infarction) ([place])   Acute respiratory failure with hypoxia and hypercapnia ([name])   Hyponatremia  [name] with HTN, hyperlipidemia, type 2 diabetes, ESRD on MWF HD, OSA on cpap, who presented with hyperkalemia 7.1 after  missing one HD session, MSSA bacteremia, acute hypoxic respiratory failure with respiratory acidosis and mixed anion gap metabolic acidosis.   ESRD on HD: Potassium normal today. Removed right IJ catheter 2/21. Repeat blood cultures obtained after catheter was removed. Catheter tip culture growing stap aureus, confirming source of bacteremia. Plan to replace catheter on Monday to continue HD. Appreciate nephro and vascular surgery's assistance.  - cardiac monitoring while off of dialysis - HD Monday  MSSA Bacteremia:  Afebrile, leukocytosis improving. VSS. HD cath culture growing staph aureus.  - appreciate ID and nephrology's assistance  - continue cefazolin - f/u cath tip culture and repeat blood cultures  - MR thoracic and lumbar spine +/- right knee once patient is more stable. May need ativan for MR - TEE next week   Type 2 DM: CBGs 252-285 - increase lantus from 3 to 5U qhs - SSI moderate  Respiratory Acidosis: initial VBG pH 7.1, pCO2 64, bicarb 24. Suspect mixed respiratory and metabolic acidosis. Oxygen saturation was in the 70s when I walked in. Nasal cannula was on his forehead due to oxygen drying out his nose. Repeat ABG this morning with pH 7.28 pCO2 54, pO2 49. Encouraged cpap/bipap use qhs.  - bipap overnight for pulmonary edema on top of OSA, OHS, and mouth breathing - [place] during the day   Anion Gap Metabolic Acidosis: Improving.  Metabolic acidosis has resolved. Anion gap remains elevated. Beta hydroxybutyrate is now normal. Mg is normal. Phos is slightly elevated.  - HD as above   HTN: holding home amlodipine HLD: continue home statin Chronic pain: continue home oxycodone-acetaminophen with hold parameters   Dispo: Anticipated discharge in several days   Isabelle Course, MD 12/01/2018, 1:15 PM Pager: [phone]

## 2018-12-01 NOTE — Progress Notes (Signed)
RT went to place pt on dream station (bipap mode) and pt stated he would call when he was ready to go on and that he was not ready at this time. RT has approached pt about placing him on NIV x2 and was unable to convince pt to go on. RT will continue to monitor.

## 2018-12-01 NOTE — Progress Notes (Signed)
Rehab Admissions Coordinator Note:  Patient was screened by Retta Diones for appropriateness for an Inpatient Acute Rehab Consult.  At this time, we are recommending Inpatient Rehab consult.  Jodell Cipro M 12/01/2018, 2:22 PM  I can be reached at [phone].

## 2018-12-01 NOTE — Progress Notes (Signed)
[facility] Progress Note  Subjective: seen in room.  TDC removed yest per VVS.  No problems w/ HD yesterday.   Vitals:   12/01/18 0900 12/01/18 1000 12/01/18 1045 12/01/18 1100  BP:      Pulse:  79 81 76  Resp: (!) 21 18 (!) 34 (!) 23  Temp:      TempSrc:      SpO2: 93% (!) 85% (S) (!) 60% 94%  Weight:      Height:        Inpatient medications: . aspirin EC  81 mg Oral Daily  . atorvastatin  40 mg Oral q1800  . chlorhexidine  15 mL Mouth Rinse BID  . Chlorhexidine Gluconate Cloth  6 each Topical Q0600  . Chlorhexidine Gluconate Cloth  6 each Topical Q0600  . ferric citrate  630 mg Oral TID WC  . heparin  5,000 Units Subcutaneous Q8H  . insulin aspart  0-15 Units Subcutaneous TID WC  . insulin aspart  0-5 Units Subcutaneous QHS  . insulin glargine  3 Units Subcutaneous QHS  . mouth rinse  15 mL Mouth Rinse q12n4p   . albuterol Stopped (11/28/18 1100)  .  ceFAZolin (ANCEF) IV 1 g (11/30/18 1748)   ferric citrate, heparin, heparin, lidocaine, oxyCODONE-acetaminophen **AND** oxyCODONE  Iron/TIBC/Ferritin/ %Sat    Component Value Date/Time   IRON 193 (H) 07/04/2015 1800   TIBC 221 (L) 07/04/2015 1800   FERRITIN 129 04/30/2012 1233   IRONPCTSAT 87 (H) 07/04/2015 1800    Exam: Gen alert, no distress Chest CTA bilat , no rales or wheezing RRR no MRG Abd soft ntnd no mass or ascites +bs very obese Ext no LE edema Neuro is alert, Ox 3 , nf R IJ TDC exit site c/d, old access scars bilat UE's   Home meds:  - amlodipine 10  - aspirin 81/ atorvastatin 40 hs  - insulin aspart 5- 15 u tid ac  - auryxia 630 ac tid/ oxy-aceta 10-325 tid prn   Dialysis: [facility] [place] MWF [address]  4h   138.5kg   Heparin 4500 + 1000/hr   R IJ TDC    Assessment/ Plan: 1. SOB/ hypoxemia - +CHF/ vol overload resolved, slightly under dry wt 2. +MSSA bacteremia - IV abx, TDC out new cath on Monday per VVS 3. ESRD HD MWF  4. DM2 on insulin  5. HTN cont meds 6. Hx  T 4-5 myelopathy - treated surgically in 08/2015 7. HD access: R arm AVF recently failed late 2019, then had new LUE AVF 10/23/18 but this is also occluded.  Per vasc surg.    [facility] 12/01/2018, 11:56 AM  Recent Labs  Lab 11/28/18 0808  11/29/18 1301 11/30/18 0251 12/01/18 0602  NA 124*   < > 129* 131* 128*  K 7.1*   < > 5.4* 4.9 4.6  CL 85*   < > 88* 91* 87*  CO2 17*   < > 24 24 24   GLUCOSE 298*   < > 197* 183* 306*  BUN 90*   < > 64* 43* 54*  CREATININE 13.88*   < > 11.43* 8.30* 7.50*  CALCIUM 8.5*   < > 7.4* 7.9* 7.9*  PHOS  --   --  5.8*  --  5.2*  ALBUMIN 3.1*  --  2.8*  --   --    < > = values in this interval not displayed.   Recent Labs  Lab 11/28/18 0808  AST  142*  ALT 78*  ALKPHOS 91  BILITOT 1.7*  PROT 8.2*   Recent Labs  Lab 11/28/18 0808  11/30/18 0251 12/01/18 0602  WBC 17.4*   < > 14.2* 12.0*  NEUTROABS 15.0*  --   --   --   HGB 10.8*   < > 10.9* 11.0*  HCT 35.2*   < > 36.6* 35.8*  MCV 97.0   < > 98.1 97.5  PLT 175   < > 158 189   < > = values in this interval not displayed.

## 2018-12-01 NOTE — Evaluation (Signed)
Physical Therapy Evaluation Patient Details Name: Seth [name] MRN: [id number] DOB: 24-Jul-1966 Today's Date: 12/01/2018   History of Present Illness  53yoM with HTN, hyperlipidemia, type 2 diabetes, ESRD on MWF HD, OSA on cpap, who presented with hyperkalemia 7.1 after missing one HD session, MSSA bacteremia, acute hypoxic respiratory failure with respiratory acidosis and mixed anion gap metabolic acidosis.     Clinical Impression  Pt admitted with above diagnosis. Pt currently with functional limitations due to the deficits listed below (see PT Problem List). PTA, pt home with children mod I with mobility and ADL utilizing SPC. Today patient weaker than baseline, standing and side stepping EOB with min A. Will trial gait next session, desat to 80% with EOB activity, returns with rest.  Pt will benefit from skilled PT to increase their independence and safety with mobility to allow discharge to the venue listed below.       Follow Up Recommendations CIR    Equipment Recommendations  (TBD)    Recommendations for Other Services OT consult;Rehab consult     Precautions / Restrictions Precautions Precautions: Fall Restrictions Weight Bearing Restrictions: No      Mobility  Bed Mobility               General bed mobility comments: sitting EOB upon entry  Transfers Overall transfer level: Needs assistance Equipment used: Rolling walker (2 wheeled) Transfers: Sit to/from Stand Sit to Stand: Min assist         General transfer comment: min A to stand from elevated bed, side stepping along EOB several times until fatigue, desat to 80% on RA. returns to 90 with 1 minute rest  Ambulation/Gait                Stairs            Wheelchair Mobility    Modified Rankin (Stroke Patients Only)       Balance Overall balance assessment: Needs assistance   Sitting balance-Leahy Scale: Good       Standing balance-Leahy Scale: Poor                                Pertinent Vitals/Pain Pain Assessment: No/denies pain    Home Living Family/patient expects to be discharged to:: Private residence Living Arrangements: Spouse/significant other Available Help at Discharge: Family;Available 24 hours/day Type of Home: House Home Access: Level entry     Home Layout: One level Home Equipment: Cane - single point      Prior Function Level of Independence: Independent with assistive device(s)         Comments: SPC at home for ambulation     Hand Dominance   Dominant Hand: Right    Extremity/Trunk Assessment   Upper Extremity Assessment Upper Extremity Assessment: Overall WFL for tasks assessed    Lower Extremity Assessment Lower Extremity Assessment: Overall WFL for tasks assessed       Communication   Communication: No difficulties  Cognition Arousal/Alertness: Awake/alert Behavior During Therapy: WFL for tasks assessed/performed Overall Cognitive Status: Within Functional Limits for tasks assessed                                        General Comments      Exercises     Assessment/Plan    PT Assessment Patient needs continued PT services  PT Problem List Decreased strength       PT Treatment Interventions      PT Goals (Current goals can be found in the Care Plan section)  Acute Rehab PT Goals Patient Stated Goal: get stronger return home PT Goal Formulation: With patient Time For Goal Achievement: 12/15/18 Potential to Achieve Goals: Good    Frequency Min 3X/week   Barriers to discharge        Co-evaluation               AM-PAC PT "6 Clicks" Mobility  Outcome Measure Help needed turning from your back to your side while in a flat bed without using bedrails?: None Help needed moving from lying on your back to sitting on the side of a flat bed without using bedrails?: A Little Help needed moving to and from a bed to a chair (including a wheelchair)?: A Lot Help  needed standing up from a chair using your arms (e.g., wheelchair or bedside chair)?: A Lot Help needed to walk in hospital room?: Total Help needed climbing 3-5 steps with a railing? : Total 6 Click Score: 13    End of Session Equipment Utilized During Treatment: Gait belt Activity Tolerance: Patient tolerated treatment well Patient left: in bed;with call bell/phone within reach Nurse Communication: Mobility status PT Visit Diagnosis: Unsteadiness on feet (R26.81)    Time: 9449-6759 PT Time Calculation (min) (ACUTE ONLY): 24 min   Charges:   PT Evaluation $PT Eval Low Complexity: 1 Low PT Treatments $Therapeutic Activity: 8-22 mins       Reinaldo Berber, PT, DPT Acute Rehabilitation Services Pager: [phone] Office: [phone]    Reinaldo Berber 12/01/2018, 2:02 PM

## 2018-12-02 DIAGNOSIS — E662 Morbid (severe) obesity with alveolar hypoventilation: Secondary | ICD-10-CM

## 2018-12-02 DIAGNOSIS — Z79891 Long term (current) use of opiate analgesic: Secondary | ICD-10-CM

## 2018-12-02 LAB — BASIC METABOLIC PANEL
Anion gap: 20 — ABNORMAL HIGH (ref 5–15)
BUN: 83 mg/dL — ABNORMAL HIGH (ref 6–20)
CO2: 21 mmol/L — ABNORMAL LOW (ref 22–32)
Calcium: 8.2 mg/dL — ABNORMAL LOW (ref 8.9–10.3)
Chloride: 87 mmol/L — ABNORMAL LOW (ref 98–111)
Creatinine, Ser: 9.57 mg/dL — ABNORMAL HIGH (ref 0.61–1.24)
GFR calc Af Amer: 6 mL/min — ABNORMAL LOW (ref >60)
GFR calc non Af Amer: 6 mL/min — ABNORMAL LOW (ref >60)
Glucose, Bld: 262 mg/dL — ABNORMAL HIGH (ref 70–99)
Potassium: 4.5 mmol/L (ref 3.5–5.1)
Sodium: 128 mmol/L — ABNORMAL LOW (ref 135–145)

## 2018-12-02 LAB — GLUCOSE, CAPILLARY
Glucose-Capillary: 323 mg/dL — ABNORMAL HIGH (ref 70–99)
Glucose-Capillary: 358 mg/dL — ABNORMAL HIGH (ref 70–99)
Glucose-Capillary: 247 mg/dL — ABNORMAL HIGH (ref 70–99)
Glucose-Capillary: 307 mg/dL — ABNORMAL HIGH (ref 70–99)
Glucose-Capillary: 286 mg/dL — ABNORMAL HIGH (ref 70–99)

## 2018-12-02 LAB — CBC
HCT: 35.8 % — ABNORMAL LOW (ref 39.0–52.0)
Hemoglobin: 11.1 g/dL — ABNORMAL LOW (ref 13.0–17.0)
MCH: 29.8 pg (ref 26.0–34.0)
MCHC: 31 g/dL (ref 30.0–36.0)
MCV: 96.2 fL (ref 80.0–100.0)
Platelets: 206 10*3/uL (ref 150–400)
RBC: 3.72 MIL/uL — ABNORMAL LOW (ref 4.22–5.81)
RDW: 14.1 % (ref 11.5–15.5)
WBC: 15.3 10*3/uL — ABNORMAL HIGH (ref 4.0–10.5)
nRBC: 0.1 % (ref 0.0–0.2)

## 2018-12-02 MED ORDER — INSULIN ASPART 100 UNIT/ML ~~LOC~~ SOLN
4.0000 [IU] | Freq: Three times a day (TID) | SUBCUTANEOUS | Status: DC
  Administered 2018-12-02: 4 [IU] via SUBCUTANEOUS

## 2018-12-02 MED ORDER — CHLORHEXIDINE GLUCONATE CLOTH 2 % EX PADS
6.0000 | MEDICATED_PAD | Freq: Every day | CUTANEOUS | Status: DC
  Administered 2018-12-03: 6 via TOPICAL

## 2018-12-02 MED ORDER — INSULIN GLARGINE 100 UNIT/ML ~~LOC~~ SOLN
15.0000 [IU] | Freq: Every day | SUBCUTANEOUS | Status: DC
  Administered 2018-12-02: 15 [IU] via SUBCUTANEOUS
  Filled 2018-12-02: qty 0.15

## 2018-12-02 NOTE — Evaluation (Signed)
Occupational Therapy Evaluation Patient Details Name: Seth [name] MRN: [id number] DOB: 03/05/1966 Today's Date: 12/02/2018    History of Present Illness 51yoM with HTN, hyperlipidemia, type 2 diabetes, ESRD on MWF HD, OSA on cpap, who presented with hyperkalemia 7.1 after missing one HD session, MSSA bacteremia, acute hypoxic respiratory failure with respiratory acidosis and mixed anion gap metabolic acidosis.    Clinical Impression   PTA, pt was independent with assistive devices for ADL and functional mobility. He currently presents with poor activity tolerance, decreased functional shoulder AROM bilaterally, lower back pain, and generalized weakness impacting his ability to participate in ADL at [facility]. He would benefit from continued OT services while admitted to improve independence and safety with ADL and functional mobility. Recommend CIR level therapies post-acute D/C to maximize return to functional independence.     Follow Up Recommendations  CIR;Supervision/Assistance - 24 hour    Equipment Recommendations  3 in 1 bedside commode;Tub/shower seat    Recommendations for Other Services Rehab consult     Precautions / Restrictions Precautions Precautions: Fall Restrictions Weight Bearing Restrictions: No      Mobility Bed Mobility Overal bed mobility: Modified Independent             General bed mobility comments: Sitting up in recliner on my arrival  Transfers Overall transfer level: Needs assistance Equipment used: Rolling walker (2 wheeled) Transfers: Sit to/from Stand Sit to Stand: Min guard         General transfer comment: Min guard assist and increased time    Balance Overall balance assessment: Needs assistance Sitting-balance support: No upper extremity supported;Feet supported Sitting balance-Leahy Scale: Good     Standing balance support: Bilateral upper extremity supported;During functional activity Standing balance-Leahy Scale: Poor                              ADL either performed or assessed with clinical judgement   ADL Overall ADL's : Needs assistance/impaired Eating/Feeding: Set up;Sitting   Grooming: Min guard;Standing   Upper Body Bathing: Set up;Sitting   Lower Body Bathing: Maximal assistance;Sit to/from stand   Upper Body Dressing : Set up;Sitting   Lower Body Dressing: Maximal assistance;Sit to/from stand   Toilet Transfer: Min guard;Ambulation;RW   Toileting- Clothing Manipulation and Hygiene: Minimal assistance;Sit to/from stand       Functional mobility during ADLs: Min guard;Rolling walker General ADL Comments: Pt limited by lower back pain and decreased activity tolerance     Vision Patient Visual Report: No change from baseline Vision Assessment?: No apparent visual deficits     Perception     Praxis      Pertinent Vitals/Pain Pain Assessment: No/denies pain     Hand Dominance Right   Extremity/Trunk Assessment Upper Extremity Assessment Upper Extremity Assessment: RUE deficits/detail;LUE deficits/detail RUE Deficits / Details: Decreased shoulder AROM due to pain LUE Deficits / Details: Decreased shoulder AROM due to pain   Lower Extremity Assessment Lower Extremity Assessment: Overall WFL for tasks assessed       Communication Communication Communication: No difficulties   Cognition Arousal/Alertness: Awake/alert Behavior During Therapy: WFL for tasks assessed/performed Overall Cognitive Status: Within Functional Limits for tasks assessed                                     General Comments  Pt is eager to return to independence. He used  to have AE for LB ADL and would benefit from this again.     Exercises     Shoulder Instructions      Home Living Family/patient expects to be discharged to:: Private residence Living Arrangements: Spouse/significant other;Children("my girl" is in and out a few days a week) Available Help at Discharge:  Family;Available PRN/intermittently Type of Home: House Home Access: Level entry     Home Layout: One level     Bathroom Shower/Tub: Teacher, early years/pre: Standard     Home Equipment: Cane - single point   Additional Comments: fluctuates use of cane and RW depending on weakness      Prior Functioning/Environment Level of Independence: Independent with assistive device(s)        Comments: SPC at home for ambulation. Does his best with ADL.        OT Problem List: Decreased strength;Decreased range of motion;Decreased activity tolerance;Impaired balance (sitting and/or standing);Decreased safety awareness;Decreased knowledge of use of DME or AE;Cardiopulmonary status limiting activity;Decreased knowledge of precautions;Obesity;Impaired UE functional use;Pain      OT Treatment/Interventions: Self-care/ADL training;Therapeutic exercise;Energy conservation;DME and/or AE instruction;Therapeutic activities;Patient/family education;Balance training    OT Goals(Current goals can be found in the care plan section) Acute Rehab OT Goals Patient Stated Goal: get stronger return home OT Goal Formulation: With patient Time For Goal Achievement: 12/16/18 Potential to Achieve Goals: Good ADL Goals Pt Will Perform Grooming: with modified independence;standing Pt Will Perform Lower Body Dressing: with modified independence;with adaptive equipment;sit to/from stand Pt Will Transfer to Toilet: with modified independence;ambulating Pt Will Perform Toileting - Clothing Manipulation and hygiene: with modified independence;sit to/from stand Pt Will Perform Tub/Shower Transfer: with modified independence;ambulating;rolling walker Additional ADL Goal #1: Pt will complete 3 consecutive standing grooming tasks with no rest breaks to indicate improved activity tolerance for ADL.  OT Frequency: Min 3X/week   Barriers to D/C:            Co-evaluation              AM-PAC OT "6  Clicks" Daily Activity     Outcome Measure Help from another person eating meals?: None Help from another person taking care of personal grooming?: A Little Help from another person toileting, which includes using toliet, bedpan, or urinal?: A Lot Help from another person bathing (including washing, rinsing, drying)?: A Lot Help from another person to put on and taking off regular upper body clothing?: A Little Help from another person to put on and taking off regular lower body clothing?: A Lot 6 Click Score: 16   End of Session Equipment Utilized During Treatment: Rolling walker;Gait belt Nurse Communication: Mobility status(nurse tech)  Activity Tolerance: Patient tolerated treatment well Patient left: in chair;with call bell/phone within reach  OT Visit Diagnosis: Other abnormalities of gait and mobility (R26.89);Muscle weakness (generalized) (M62.81);Pain Pain - Right/Left: (bilateral) Pain - part of body: Shoulder                Time: 1030-1050 OT Time Calculation (min): 20 min Charges:  OT General Charges $OT Visit: 1 Visit OT Evaluation $OT Eval Moderate Complexity: [place] A Rahima Fleishman 12/02/2018, 11:54 AM

## 2018-12-02 NOTE — Progress Notes (Signed)
Internal Medicine Attending  Date: 12/02/2018  Patient name: Seth [name] Medical record number: [id number] Date of birth: 1966/08/12 Age: 53 y.o. Gender: male  I saw and evaluated the patient. I reviewed the resident's note by Dr. Donne Hazel and I agree with the resident's findings and plans as documented in her progress note.  When seen on rounds this morning Seth [name] was subjectively feeling better.  He was most impressed with the nocturnal CPAP which he states has made a marked improvement in his ability to get a restful night sleep.  Given the success, I think he is brought into the importance and benefit of nocturnal CPAP for his obstructive sleep apnea.  He has remained afebrile and the early blood cultures have been no growth to date.  He continues on the antibiotics for the MSSA bacteremia and is scheduled to undergo replacement of the percutaneous hemodialysis catheter tomorrow.

## 2018-12-02 NOTE — Progress Notes (Signed)
   Subjective: Feeling well this morning. Up in the chair. Took nasal cannula off during our visit and he maintained adequate O2 saturations. Discussed use of cpap while asleep, not while awake. He understood and notes significant improvement in the quality of sleep when he wears the cpap. Discussed plan for continued antibiotics and replacement of dialysis catheter tomorrow. Plan to get an MRI of his spine this coming week.   Objective:  Vital signs in last 24 hours: Vitals:   12/01/18 2349 12/02/18 0351 12/02/18 0820 12/02/18 0850  BP: 130/66 134/70  121/70  Pulse: 79 83  73  Resp: (!) 22 19  18   Temp: (!) 97.5 F (36.4 C) 98 F (36.7 C)  98 F (36.7 C)  TempSrc: Axillary Oral  Oral  SpO2: 98% 97% 94% 94%  Weight:      Height:       Gen: obese male sitting up in the chair, NAD, alert and talkative Pulm: CTAB, breathing comfortably on RA  Antibiotics: Azithromycin 2/19-2/19 Ceftriaxone 2/19-2/19 Cefazolin 2/20 - present   Micro:  2/19 BC x2: Staph aureus, pan sensitive   2/21 Cath Tip culture: Stap aureus   2/22 repeat BC: NG at 1d   Assessment/Plan:  Principal Problem:   Bacteremia due to methicillin susceptible Staphylococcus aureus (MSSA) Active Problems:   Hyperlipidemia associated with type 2 diabetes mellitus ([name])   Morbid obesity ([name])   Hypertension associated with diabetes ([name])   End stage renal disease on dialysis ([name])   OSA (obstructive sleep apnea)   Hyperkalemia   NSTEMI (non-ST elevated myocardial infarction) ([name])   Acute respiratory failure with hypoxia and hypercapnia ([name])   Hyponatremia  [name] with HTN, hyperlipidemia, type 2 diabetes, ESRD on MWF HD, OSA on cpap, who presented with hyperkalemia 7.1 after missing one HD session, MSSA bacteremia, acute hypoxic respiratory failure with respiratory acidosis and mixed anion gap metabolic acidosis.   ESRD on HD: Potassium normal today.  Plan to replace catheter on Monday to continue HD. Appreciate  nephro and vascular surgery's assistance.  - NPO at [facility] for temporary HD catheter placement  - HD Monday  MSSA Bacteremia:  Leukocytosis increased this morning but he remains afebrile and feels well. VSS. Removed right IJ catheter 2/21.  Catheter tip culture growing stap aureus, confirming source of bacteremia. HD cath culture growing staph aureus. Repeat blood cultures obtained after catheter was removed have been no growth at one day. - appreciate ID and nephrology's assistance  - continue cefazolin - f/u cath tip culture and repeat blood cultures  - MR thoracic and lumbar spine +/- right knee once patient is more stable. May need ativan for MR - TEE next week   Type 2 DM: CBGs in the 200-300s - increase lantus from 5U to 15U qhs - add novolog 4U tid wc - continue SSI moderate  OSA, OHS: maintaining oxygen saturations > 90% on RA during my evaluation. Does not wear home oxygen  - cpap qhs  - [name] during the day, wean as toleratead    HTN: holding home amlodipine HLD: continue home statin Chronic pain: continue home oxycodone-acetaminophen with hold parameters   Dispo: Anticipated discharge in several days   Isabelle Course, MD 12/02/2018, 11:59 AM Pager: [phone]

## 2018-12-02 NOTE — Progress Notes (Signed)
   VASCULAR SURGERY ASSESSMENT & PLAN:   He is for placement of a new tunneled dialysis catheter tomorrow by Dr. Carlis Abbott.  His infected catheter was removed on 11/30/2018.  I have discussed this with the patient and all of his questions were answered.   I have written preop orders.   SUBJECTIVE:   No complaints.  PHYSICAL EXAM:   Vitals:   12/01/18 2020 12/01/18 2344 12/01/18 2349 12/02/18 0351  BP: (!) 155/68  130/66 134/70  Pulse: 75  79 83  Resp: 19 (!) 29 (!) 22 19  Temp: 97.6 F (36.4 C)  (!) 97.5 F (36.4 C) 98 F (36.7 C)  TempSrc: Oral  Axillary Oral  SpO2: 98% 98% 98% 97%  Weight:      Height:       Lungs clear.  LABS:   Lab Results  Component Value Date   WBC 15.3 (H) 12/02/2018   HGB 11.1 (L) 12/02/2018   HCT 35.8 (L) 12/02/2018   MCV 96.2 12/02/2018   PLT 206 12/02/2018   CBG (last 3)  Recent Labs    12/01/18 1633 12/01/18 2218 12/02/18 0620  GLUCAP 286* 312* 323*    PROBLEM LIST:    Principal Problem:   Bacteremia due to methicillin susceptible Staphylococcus aureus (MSSA) Active Problems:   Hyperlipidemia associated with type 2 diabetes mellitus ([place])   Morbid obesity ([place])   Hypertension associated with diabetes ([place])   End stage renal disease on dialysis ([place])   OSA (obstructive sleep apnea)   Hyperkalemia   NSTEMI (non-ST elevated myocardial infarction) ([place])   Acute respiratory failure with hypoxia and hypercapnia ([name])   Hyponatremia   CURRENT MEDS:   . aspirin EC  81 mg Oral Daily  . atorvastatin  40 mg Oral q1800  . chlorhexidine  15 mL Mouth Rinse BID  . Chlorhexidine Gluconate Cloth  6 each Topical Q0600  . Chlorhexidine Gluconate Cloth  6 each Topical Q0600  . ferric citrate  630 mg Oral TID WC  . heparin  5,000 Units Subcutaneous Q8H  . insulin aspart  0-15 Units Subcutaneous TID WC  . insulin aspart  0-5 Units Subcutaneous QHS  . insulin glargine  5 Units Subcutaneous QHS  . mouth rinse  15 mL Mouth Rinse q12n4p     Deitra Mayo Beeper: [phone] Office: [phone] 12/02/2018

## 2018-12-02 NOTE — Progress Notes (Signed)
Physical Therapy Treatment Patient Details Name: Seth [name] MRN: [id number] DOB: 1965-11-10 Today's Date: 12/02/2018    History of Present Illness 38yoM with HTN, hyperlipidemia, type 2 diabetes, ESRD on MWF HD, OSA on cpap, who presented with hyperkalemia 7.1 after missing one HD session, MSSA bacteremia, acute hypoxic respiratory failure with respiratory acidosis and mixed anion gap metabolic acidosis.     PT Comments    Patient excited to work with therapy today, happy with progress he's been making and to "finally be getting out of this bed". Ambulating short distance to door and back, desat to 86 returns to 93% quickly on RA with rest. Standing with contact guard from elevated surface, will require more assistance from bedside chair. Cont to rec CIR.      Follow Up Recommendations  CIR     Equipment Recommendations  (TBD)    Recommendations for Other Services OT consult;Rehab consult     Precautions / Restrictions Precautions Precautions: Fall Restrictions Weight Bearing Restrictions: No    Mobility  Bed Mobility Overal bed mobility: Modified Independent                Transfers Overall transfer level: Needs assistance Equipment used: Rolling walker (2 wheeled) Transfers: Sit to/from Stand Sit to Stand: Min guard         General transfer comment: min guard from elevated surface, suspect from chair will require more assistance   Ambulation/Gait Ambulation/Gait assistance: Min guard Gait Distance (Feet): 15 Feet Assistive device: Rolling walker (2 wheeled) Gait Pattern/deviations: Step-to pattern Gait velocity: decreased   General Gait Details: pt ambulating on RA, de sat to 86% by trip to door and back to chair. RW needed for stability.    Stairs             Wheelchair Mobility    Modified Rankin (Stroke Patients Only)       Balance Overall balance assessment: Needs assistance   Sitting balance-Leahy Scale: Good        Standing balance-Leahy Scale: Poor                              Cognition Arousal/Alertness: Awake/alert Behavior During Therapy: WFL for tasks assessed/performed Overall Cognitive Status: Within Functional Limits for tasks assessed                                        Exercises      General Comments        Pertinent Vitals/Pain Pain Assessment: No/denies pain    Home Living                      Prior Function            PT Goals (current goals can now be found in the care plan section) Acute Rehab PT Goals Patient Stated Goal: get stronger return home PT Goal Formulation: With patient Time For Goal Achievement: 12/15/18 Potential to Achieve Goals: Good Progress towards PT goals: Progressing toward goals    Frequency    Min 3X/week      PT Plan Current plan remains appropriate    Co-evaluation              AM-PAC PT "6 Clicks" Mobility   Outcome Measure  Help needed turning from your back to your side while in a  flat bed without using bedrails?: None Help needed moving from lying on your back to sitting on the side of a flat bed without using bedrails?: A Little Help needed moving to and from a bed to a chair (including a wheelchair)?: A Lot Help needed standing up from a chair using your arms (e.g., wheelchair or bedside chair)?: A Lot Help needed to walk in hospital room?: Total Help needed climbing 3-5 steps with a railing? : Total 6 Click Score: 13    End of Session Equipment Utilized During Treatment: Gait belt Activity Tolerance: Patient tolerated treatment well Patient left: with call bell/phone within reach;in chair Nurse Communication: Mobility status PT Visit Diagnosis: Unsteadiness on feet (R26.81)     Time: 5537-4827 PT Time Calculation (min) (ACUTE ONLY): 22 min  Charges:  $Gait Training: 8-22 mins                    Reinaldo Berber, PT, DPT Acute Rehabilitation Services Pager:  [phone] Office: [place] 12/02/2018, 9:02 AM

## 2018-12-02 NOTE — Progress Notes (Signed)
[facility] Progress Note  Subjective: seen in room. IN good spirits, no c/o.   Vitals:   12/02/18 0351 12/02/18 0820 12/02/18 0850 12/02/18 1201  BP: 134/70  121/70 (!) 145/98  Pulse: 83  73 70  Resp: 19  18 18   Temp: 98 F (36.7 C)  98 F (36.7 C) 97.9 F (36.6 C)  TempSrc: Oral  Oral Oral  SpO2: 97% 94% 94% 92%  Weight:      Height:        Inpatient medications: . aspirin EC  81 mg Oral Daily  . atorvastatin  40 mg Oral q1800  . chlorhexidine  15 mL Mouth Rinse BID  . Chlorhexidine Gluconate Cloth  6 each Topical Q0600  . ferric citrate  630 mg Oral TID WC  . heparin  5,000 Units Subcutaneous Q8H  . insulin aspart  0-15 Units Subcutaneous TID WC  . insulin aspart  0-5 Units Subcutaneous QHS  . insulin aspart  4 Units Subcutaneous TID WC  . insulin glargine  15 Units Subcutaneous QHS  . mouth rinse  15 mL Mouth Rinse q12n4p   . albuterol Stopped (11/28/18 1100)  .  ceFAZolin (ANCEF) IV Stopped (12/02/18 0206)   ferric citrate, heparin, heparin, lidocaine, oxyCODONE-acetaminophen **AND** oxyCODONE  Iron/TIBC/Ferritin/ %Sat    Component Value Date/Time   IRON 193 (H) 07/04/2015 1800   TIBC 221 (L) 07/04/2015 1800   FERRITIN 129 04/30/2012 1233   IRONPCTSAT 87 (H) 07/04/2015 1800    Exam: Gen alert, no distress Chest CTA bilat , no rales or wheezing RRR no MRG Abd soft ntnd no mass or ascites +bs very obese Ext no LE edema Neuro is alert, Ox 3 , nf R IJ TDC exit site c/d, old access scars bilat UE's   Home meds:  - amlodipine 10  - aspirin 81/ atorvastatin 40 hs  - insulin aspart 5- 15 u tid ac  - auryxia 630 ac tid/ oxy-aceta 10-325 tid prn   Dialysis: [facility] MWF [address]  4h   138.5kg   Heparin 4500 + 1000/hr   R IJ TDC    Assessment/ Plan: 1. +MSSA bacteremia - IV abx, TDC out , for new cath on Monday per [facility] 2. +CHF/ vol overload resolved 3. ESRD HD MWF 4. HD access: R arm AVF recently failed late 2019, then had  new LUE AVF 10/23/18 but this is also occluded.  Seen by [facility]. 5. DM2 on insulin  6. HTN cont meds 7. 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   Recent Labs  Lab 11/28/18 0808  AST 142*  ALT 78*  ALKPHOS 91  BILITOT 1.7*  PROT 8.2*   Recent Labs  Lab 11/28/18 0808  12/01/18 0602 12/02/18 0326  WBC 17.4*   < > 12.0* 15.3*  NEUTROABS 15.0*  --   --   --   HGB 10.8*   < > 11.0* 11.1*  HCT 35.2*   < > 35.8* 35.8*  MCV 97.0   < > 97.5 96.2  PLT 175   < > 189 206   < > = values in this interval not displayed.

## 2018-12-03 ENCOUNTER — Inpatient Hospital Stay (HOSPITAL_COMMUNITY): Payer: Medicare Other

## 2018-12-03 ENCOUNTER — Encounter (HOSPITAL_COMMUNITY)
Admission: EM | Disposition: A | Discharge status: Home Health | Payer: Self-pay | Source: Home / Self Care | Attending: Internal Medicine

## 2018-12-03 ENCOUNTER — Encounter (HOSPITAL_COMMUNITY): Payer: Self-pay

## 2018-12-03 DIAGNOSIS — E875 Hyperkalemia: Secondary | ICD-10-CM

## 2018-12-03 DIAGNOSIS — J9601 Acute respiratory failure with hypoxia: Secondary | ICD-10-CM

## 2018-12-03 DIAGNOSIS — E785 Hyperlipidemia, unspecified: Secondary | ICD-10-CM

## 2018-12-03 DIAGNOSIS — J9602 Acute respiratory failure with hypercapnia: Secondary | ICD-10-CM

## 2018-12-03 DIAGNOSIS — I120 Hypertensive chronic kidney disease with stage 5 chronic kidney disease or end stage renal disease: Secondary | ICD-10-CM

## 2018-12-03 HISTORY — PX: INSERTION OF DIALYSIS CATHETER: SHX1324

## 2018-12-03 LAB — RENAL FUNCTION PANEL
Albumin: 3 g/dL — ABNORMAL LOW (ref 3.5–5.0)
Anion gap: 16 — ABNORMAL HIGH (ref 5–15)
BUN: 107 mg/dL — ABNORMAL HIGH (ref 6–20)
CO2: 24 mmol/L (ref 22–32)
Calcium: 8.3 mg/dL — ABNORMAL LOW (ref 8.9–10.3)
Chloride: 89 mmol/L — ABNORMAL LOW (ref 98–111)
Creatinine, Ser: 11.43 mg/dL — ABNORMAL HIGH (ref 0.61–1.24)
GFR calc Af Amer: 5 mL/min — ABNORMAL LOW (ref >60)
GFR calc non Af Amer: 4 mL/min — ABNORMAL LOW (ref >60)
Glucose, Bld: 221 mg/dL — ABNORMAL HIGH (ref 70–99)
Phosphorus: 6.3 mg/dL — ABNORMAL HIGH (ref 2.5–4.6)
Potassium: 4.8 mmol/L (ref 3.5–5.1)
Sodium: 129 mmol/L — ABNORMAL LOW (ref 135–145)

## 2018-12-03 LAB — GLUCOSE, CAPILLARY
Glucose-Capillary: 301 mg/dL — ABNORMAL HIGH (ref 70–99)
Glucose-Capillary: 304 mg/dL — ABNORMAL HIGH (ref 70–99)
Glucose-Capillary: 312 mg/dL — ABNORMAL HIGH (ref 70–99)
Glucose-Capillary: 368 mg/dL — ABNORMAL HIGH (ref 70–99)
Glucose-Capillary: 340 mg/dL — ABNORMAL HIGH (ref 70–99)

## 2018-12-03 LAB — CATH TIP CULTURE

## 2018-12-03 LAB — CBC
HCT: 37.8 % — ABNORMAL LOW (ref 39.0–52.0)
Hemoglobin: 11.9 g/dL — ABNORMAL LOW (ref 13.0–17.0)
MCH: 30.4 pg (ref 26.0–34.0)
MCHC: 31.5 g/dL (ref 30.0–36.0)
MCV: 96.4 fL (ref 80.0–100.0)
Platelets: 247 10*3/uL (ref 150–400)
RBC: 3.92 MIL/uL — ABNORMAL LOW (ref 4.22–5.81)
RDW: 14.2 % (ref 11.5–15.5)
WBC: 14.5 10*3/uL — ABNORMAL HIGH (ref 4.0–10.5)
nRBC: 0.1 % (ref 0.0–0.2)

## 2018-12-03 SURGERY — INSERTION OF DIALYSIS CATHETER
Anesthesia: General | Site: Chest | Laterality: Left

## 2018-12-03 MED ORDER — INSULIN ASPART 100 UNIT/ML ~~LOC~~ SOLN
6.0000 [IU] | Freq: Three times a day (TID) | SUBCUTANEOUS | Status: DC
  Administered 2018-12-03 – 2018-12-08 (×12): 6 [IU] via SUBCUTANEOUS

## 2018-12-03 MED ORDER — HEPARIN SODIUM (PORCINE) 1000 UNIT/ML DIALYSIS: 1500.0000 [IU] | INTRAMUSCULAR | Status: DC | PRN

## 2018-12-03 MED ORDER — MIDAZOLAM HCL 2 MG/2ML IJ SOLN
INTRAMUSCULAR | Status: AC
  Filled 2018-12-03: qty 2

## 2018-12-03 MED ORDER — ONDANSETRON HCL 4 MG/2ML IJ SOLN
INTRAMUSCULAR | Status: AC
  Filled 2018-12-03: qty 2

## 2018-12-03 MED ORDER — LIDOCAINE 2% (20 MG/ML) 5 ML SYRINGE
INTRAMUSCULAR | Status: AC
  Filled 2018-12-03: qty 5

## 2018-12-03 MED ORDER — HEPARIN SODIUM (PORCINE) 1000 UNIT/ML IJ SOLN
INTRAMUSCULAR | Status: AC
  Filled 2018-12-03: qty 4

## 2018-12-03 MED ORDER — DEXAMETHASONE SODIUM PHOSPHATE 10 MG/ML IJ SOLN
INTRAMUSCULAR | Status: AC
  Filled 2018-12-03: qty 1

## 2018-12-03 MED ORDER — SODIUM CHLORIDE 0.9 % IV SOLN
INTRAVENOUS | Status: DC | PRN
  Administered 2018-12-03: 500 mL

## 2018-12-03 MED ORDER — MUPIROCIN 2 % EX OINT
1.0000 "application " | TOPICAL_OINTMENT | Freq: Two times a day (BID) | CUTANEOUS | Status: AC
  Administered 2018-12-03 – 2018-12-07 (×9): 1 via NASAL
  Filled 2018-12-03 (×2): qty 22

## 2018-12-03 MED ORDER — CEFAZOLIN SODIUM-DEXTROSE 2-4 GM/100ML-% IV SOLN
2.0000 g | INTRAVENOUS | Status: DC
  Administered 2018-12-03 – 2018-12-07 (×3): 2 g via INTRAVENOUS
  Filled 2018-12-03 (×3): qty 100

## 2018-12-03 MED ORDER — ONDANSETRON HCL 4 MG/2ML IJ SOLN
INTRAMUSCULAR | Status: DC | PRN
  Administered 2018-12-03: 4 mg via INTRAVENOUS

## 2018-12-03 MED ORDER — ONDANSETRON HCL 4 MG/2ML IJ SOLN: 4.0000 mg | Freq: Once | INTRAMUSCULAR | Status: DC | PRN

## 2018-12-03 MED ORDER — PROPOFOL 10 MG/ML IV BOLUS
INTRAVENOUS | Status: AC
  Filled 2018-12-03: qty 20

## 2018-12-03 MED ORDER — DEXAMETHASONE SODIUM PHOSPHATE 10 MG/ML IJ SOLN
INTRAMUSCULAR | Status: DC | PRN
  Administered 2018-12-03: 4 mg via INTRAVENOUS

## 2018-12-03 MED ORDER — SODIUM CHLORIDE 0.9 % IV SOLN
INTRAVENOUS | Status: DC | PRN
  Administered 2018-12-03: 07:00:00 via INTRAVENOUS

## 2018-12-03 MED ORDER — LIDOCAINE HCL (PF) 1 % IJ SOLN
INTRAMUSCULAR | Status: AC
  Filled 2018-12-03: qty 30

## 2018-12-03 MED ORDER — INSULIN ASPART 100 UNIT/ML ~~LOC~~ SOLN
SUBCUTANEOUS | Status: AC
  Filled 2018-12-03: qty 1

## 2018-12-03 MED ORDER — ALBUMIN HUMAN 5 % IV SOLN
INTRAVENOUS | Status: DC | PRN
  Administered 2018-12-03: 08:00:00 via INTRAVENOUS

## 2018-12-03 MED ORDER — VASOPRESSIN 20 UNIT/ML IV SOLN
INTRAVENOUS | Status: DC | PRN
  Administered 2018-12-03: 1 [IU] via INTRAVENOUS
  Administered 2018-12-03: 2 [IU] via INTRAVENOUS

## 2018-12-03 MED ORDER — SUCCINYLCHOLINE CHLORIDE 200 MG/10ML IV SOSY
PREFILLED_SYRINGE | INTRAVENOUS | Status: AC
  Filled 2018-12-03: qty 10

## 2018-12-03 MED ORDER — PROPOFOL 10 MG/ML IV BOLUS
INTRAVENOUS | Status: DC | PRN
  Administered 2018-12-03: 130 mg via INTRAVENOUS
  Administered 2018-12-03: 30 mg via INTRAVENOUS
  Administered 2018-12-03: 40 mg via INTRAVENOUS

## 2018-12-03 MED ORDER — INSULIN GLARGINE 100 UNIT/ML ~~LOC~~ SOLN
22.0000 [IU] | Freq: Every day | SUBCUTANEOUS | Status: DC
  Administered 2018-12-03 – 2018-12-07 (×5): 22 [IU] via SUBCUTANEOUS
  Filled 2018-12-03 (×5): qty 0.22

## 2018-12-03 MED ORDER — SODIUM CHLORIDE 0.9 % IV SOLN
INTRAVENOUS | Status: DC | PRN
  Administered 2018-12-03: 50 ug/min via INTRAVENOUS

## 2018-12-03 MED ORDER — PHENYLEPHRINE 40 MCG/ML (10ML) SYRINGE FOR IV PUSH (FOR BLOOD PRESSURE SUPPORT)
PREFILLED_SYRINGE | INTRAVENOUS | Status: AC
  Filled 2018-12-03: qty 20

## 2018-12-03 MED ORDER — EPHEDRINE 5 MG/ML INJ
INTRAVENOUS | Status: AC
  Filled 2018-12-03: qty 20

## 2018-12-03 MED ORDER — LIDOCAINE HCL 1 % IJ SOLN
INTRAMUSCULAR | Status: DC | PRN
  Administered 2018-12-03: 20 mL

## 2018-12-03 MED ORDER — CHLORHEXIDINE GLUCONATE CLOTH 2 % EX PADS
6.0000 | MEDICATED_PAD | Freq: Every day | CUTANEOUS | Status: AC
  Administered 2018-12-04 – 2018-12-08 (×5): 6 via TOPICAL

## 2018-12-03 MED ORDER — CEFAZOLIN SODIUM-DEXTROSE 2-4 GM/100ML-% IV SOLN: 2.0000 g | INTRAVENOUS | Status: DC

## 2018-12-03 MED ORDER — PHENYLEPHRINE 40 MCG/ML (10ML) SYRINGE FOR IV PUSH (FOR BLOOD PRESSURE SUPPORT)
PREFILLED_SYRINGE | INTRAVENOUS | Status: DC | PRN
  Administered 2018-12-03: 200 ug via INTRAVENOUS
  Administered 2018-12-03: 80 ug via INTRAVENOUS
  Administered 2018-12-03: 120 ug via INTRAVENOUS

## 2018-12-03 MED ORDER — FENTANYL CITRATE (PF) 100 MCG/2ML IJ SOLN: 25.0000 ug | INTRAMUSCULAR | Status: DC | PRN

## 2018-12-03 MED ORDER — LIDOCAINE HCL (CARDIAC) PF 100 MG/5ML IV SOSY
PREFILLED_SYRINGE | INTRAVENOUS | Status: DC | PRN
  Administered 2018-12-03: 80 mg via INTRATRACHEAL

## 2018-12-03 MED ORDER — PROPOFOL 1000 MG/100ML IV EMUL
INTRAVENOUS | Status: AC
  Filled 2018-12-03: qty 100

## 2018-12-03 MED ORDER — ROCURONIUM BROMIDE 50 MG/5ML IV SOSY
PREFILLED_SYRINGE | INTRAVENOUS | Status: AC
  Filled 2018-12-03: qty 5

## 2018-12-03 MED ORDER — VASOPRESSIN 20 UNIT/ML IV SOLN
INTRAVENOUS | Status: AC
  Filled 2018-12-03: qty 1

## 2018-12-03 MED ORDER — EPHEDRINE SULFATE-NACL 50-0.9 MG/10ML-% IV SOSY
PREFILLED_SYRINGE | INTRAVENOUS | Status: DC | PRN
  Administered 2018-12-03 (×5): 10 mg via INTRAVENOUS

## 2018-12-03 MED ORDER — HEPARIN SODIUM (PORCINE) 1000 UNIT/ML IJ SOLN
INTRAMUSCULAR | Status: DC | PRN
  Administered 2018-12-03: 3800 [IU]

## 2018-12-03 MED ORDER — INSULIN ASPART 100 UNIT/ML ~~LOC~~ SOLN
7.0000 [IU] | SUBCUTANEOUS | Status: AC
  Administered 2018-12-03: 7 [IU] via SUBCUTANEOUS

## 2018-12-03 MED ORDER — FENTANYL CITRATE (PF) 250 MCG/5ML IJ SOLN
INTRAMUSCULAR | Status: AC
  Filled 2018-12-03: qty 5

## 2018-12-03 MED ORDER — SODIUM CHLORIDE 0.9 % IV SOLN
INTRAVENOUS | Status: AC
  Filled 2018-12-03: qty 1.2

## 2018-12-03 MED ORDER — HEPARIN SODIUM (PORCINE) 1000 UNIT/ML IJ SOLN
INTRAMUSCULAR | Status: AC
  Filled 2018-12-03: qty 1

## 2018-12-03 SURGICAL SUPPLY — 47 items
BAG BANDED W/RUBBER/TAPE 36X54 (MISCELLANEOUS) ×2 IMPLANT
BAG DECANTER FOR FLEXI CONT (MISCELLANEOUS) ×3 IMPLANT
BIOPATCH RED 1 DISK 7.0 (GAUZE/BANDAGES/DRESSINGS) ×2 IMPLANT
BIOPATCH RED 1IN DISK 7.0MM (GAUZE/BANDAGES/DRESSINGS) ×1
CATH PALINDROME RT-P 15FX19CM (CATHETERS) IMPLANT
CATH PALINDROME RT-P 15FX23CM (CATHETERS) IMPLANT
CATH PALINDROME RT-P 15FX28CM (CATHETERS) ×2 IMPLANT
CATH PALINDROME RT-P 15FX55CM (CATHETERS) IMPLANT
CATH STRAIGHT 5FR 65CM (CATHETERS) IMPLANT
COVER DOME SNAP 22 D (MISCELLANEOUS) ×2 IMPLANT
COVER PROBE W GEL 5X96 (DRAPES) ×3 IMPLANT
COVER SURGICAL LIGHT HANDLE (MISCELLANEOUS) ×3 IMPLANT
COVER WAND RF STERILE (DRAPES) ×1 IMPLANT
DECANTER SPIKE VIAL GLASS SM (MISCELLANEOUS) ×3 IMPLANT
DERMABOND ADVANCED (GAUZE/BANDAGES/DRESSINGS) ×2
DERMABOND ADVANCED .7 DNX12 (GAUZE/BANDAGES/DRESSINGS) ×1 IMPLANT
DRAPE C-ARM 42X72 X-RAY (DRAPES) ×1 IMPLANT
DRAPE CHEST BREAST 15X10 FENES (DRAPES) ×3 IMPLANT
GAUZE 4X4 16PLY RFD (DISPOSABLE) ×3 IMPLANT
GLOVE BIO SURGEON STRL SZ7.5 (GLOVE) ×3 IMPLANT
GLOVE BIOGEL PI IND STRL 8 (GLOVE) ×1 IMPLANT
GLOVE BIOGEL PI INDICATOR 8 (GLOVE) ×2
GOWN STRL REUS W/ TWL LRG LVL3 (GOWN DISPOSABLE) ×2 IMPLANT
GOWN STRL REUS W/ TWL XL LVL3 (GOWN DISPOSABLE) ×2 IMPLANT
GOWN STRL REUS W/TWL LRG LVL3 (GOWN DISPOSABLE) ×4
GOWN STRL REUS W/TWL XL LVL3 (GOWN DISPOSABLE) ×4
KIT BASIN OR (CUSTOM PROCEDURE TRAY) ×3 IMPLANT
KIT TURNOVER KIT B (KITS) ×3 IMPLANT
NDL 18GX1X1/2 (RX/OR ONLY) (NEEDLE) ×1 IMPLANT
NDL HYPO 25GX1X1/2 BEV (NEEDLE) ×1 IMPLANT
NEEDLE 18GX1X1/2 (RX/OR ONLY) (NEEDLE) ×3 IMPLANT
NEEDLE HYPO 25GX1X1/2 BEV (NEEDLE) ×3 IMPLANT
NS IRRIG 1000ML POUR BTL (IV SOLUTION) ×3 IMPLANT
PACK SURGICAL SETUP 50X90 (CUSTOM PROCEDURE TRAY) ×3 IMPLANT
PAD ARMBOARD 7.5X6 YLW CONV (MISCELLANEOUS) ×6 IMPLANT
SET MICROPUNCTURE 5F STIFF (MISCELLANEOUS) IMPLANT
SOAP 2 % CHG 4 OZ (WOUND CARE) ×3 IMPLANT
SUT ETHILON 3 0 PS 1 (SUTURE) ×3 IMPLANT
SUT MNCRL AB 4-0 PS2 18 (SUTURE) ×3 IMPLANT
SYR 10ML LL (SYRINGE) ×3 IMPLANT
SYR 20CC LL (SYRINGE) ×6 IMPLANT
SYR 5ML LL (SYRINGE) ×3 IMPLANT
SYR CONTROL 10ML LL (SYRINGE) ×3 IMPLANT
TOWEL GREEN STERILE (TOWEL DISPOSABLE) ×3 IMPLANT
TOWEL GREEN STERILE FF (TOWEL DISPOSABLE) ×3 IMPLANT
WATER STERILE IRR 1000ML POUR (IV SOLUTION) ×3 IMPLANT
WIRE AMPLATZ SS-J .035X180CM (WIRE) IMPLANT

## 2018-12-03 NOTE — Progress Notes (Signed)
  Date: 12/03/2018  Patient name: Seth [name]  Medical record number: [id number]  Date of birth: April 07, 1966   I have seen and evaluated this patient and I have discussed the plan of care with the house staff. Please see their note for complete details. I concur with their findings with the following additions/corrections:   He reports feeling well today, but a little bit frustrated about the MRSA detected on his catheter and a little worried about having a TEE this week.  We provided education and reassurance regarding these factors.  New dialysis catheter was placed today by the vascular team and used for dialysis this afternoon.  Unfortunately, he still does not have a long-term dialysis access, and this will need to be addressed in the future.  For his MSSA bacteremia, we are continuing cefazolin, repeat blood cultures remain clear and no sign of MRSA on any of his blood cultures, so we will continue with MSSA coverage for now.  TEE still needs to be done.  There has been some concern about his chronic right knee pain.  On exam, there continues to be evidence of a small effusion, although exam is limited by his obesity.  On bedside ultrasound today, there is clear evidence for a joint effusion, and we will discuss with ID team whether an MRI or arthrocentesis is preferred to further evaluate this.  We also will need a MRI of his spine as he has a history of epidural abscess and has back pain.  His respiratory status has improved significantly with UF, even after HD holiday over the weekend.  Hyperkalemia has resolved, as has his metabolic acidosis.  It appears he is benefiting from wearing CPAP for his OSA.  Lenice Pressman, M.D., Ph.D. 12/03/2018, 3:28 PM

## 2018-12-03 NOTE — Anesthesia Preprocedure Evaluation (Signed)
Anesthesia Evaluation  Patient identified by MRN, date of birth, ID band Patient awake    Reviewed: Allergy & Precautions, NPO status , Patient's Chart, lab work & pertinent test results  Airway Mallampati: III  TM Distance: >3 FB Neck ROM: Full    Dental  (+) Missing   Pulmonary sleep apnea and Continuous Positive Airway Pressure Ventilation ,    Pulmonary exam normal breath sounds clear to auscultation       Cardiovascular hypertension, Pt. on medications Normal cardiovascular exam Rhythm:Regular Rate:Normal  ECG: rate 96. Normal sinus rhythm Left anterior fascicular block Possible Anterolateral infarct , age undetermined Left atrial abnormality First degree heart block  ECHO: 1. The left ventricle has hyperdynamic systolic function, with an ejection fraction of >65%. The cavity size was normal. Left ventricular diastolic Doppler parameters are consistent with impaired relaxation.  2. The right ventricle has normal systolic function. The cavity was normal. There is no increase in right ventricular wall thickness.  3. The tricuspid valve is normal in structure.  4. The aortic valve was not well visualized Mild thickening of the aortic valve Mild calcification of the aortic valve.  5. The pulmonic valve was normal in structure.  6. No vegetations detected.   Neuro/Psych PSYCHIATRIC DISORDERS Anxiety negative neurological ROS     GI/Hepatic negative GI ROS, Neg liver ROS,   Endo/Other  diabetes, Insulin DependentMorbid obesityhyponatremia  Renal/GU ESRF and DialysisRenal diseaseOn M, W, F     Musculoskeletal negative musculoskeletal ROS (+)   Abdominal (+) + obese,   Peds  Hematology  (+) anemia , HLD   Anesthesia Other Findings END STAGE RENAL DISEASE  Reproductive/Obstetrics                             Anesthesia Physical Anesthesia Plan  ASA: IV  Anesthesia Plan: General    Post-op Pain Management:    Induction: Intravenous  PONV Risk Score and Plan: 2 and Ondansetron and Treatment may vary due to age or medical condition  Airway Management Planned: LMA  Additional Equipment:   Intra-op Plan:   Post-operative Plan: Extubation in OR  Informed Consent: I have reviewed the patients History and Physical, chart, labs and discussed the procedure including the risks, benefits and alternatives for the proposed anesthesia with the patient or authorized representative who has indicated his/her understanding and acceptance.     Dental advisory given  Plan Discussed with: CRNA  Anesthesia Plan Comments:         Anesthesia Quick Evaluation

## 2018-12-03 NOTE — Progress Notes (Signed)
Patient is in hemodialysis, therefore unable to perform upper extremity vein mapping. Will attempt again tomorrow 2/25.  12/03/2018 3:32 PM Maudry Mayhew, MHA, RVT, RDCS, RDMS

## 2018-12-03 NOTE — Progress Notes (Signed)
[facility]  Subjective  - No complaints.   Objective 140/70 76 98.2 F (36.8 C) (Axillary) 16 93%  Intake/Output Summary (Last 24 hours) at 12/03/2018 0717 Last data filed at 12/02/2018 2024 Gross per 24 hour  Intake 340 ml  Output 0 ml  Net 340 ml    PE:  Resting, no acute distress RIJ tunneled cathter removed  Laboratory Lab Results: Recent Labs    12/02/18 0326 12/03/18 0324  WBC 15.3* 14.5*  HGB 11.1* 11.9*  HCT 35.8* 37.8*  PLT 206 247   BMET Recent Labs    12/02/18 0326 12/03/18 0324  NA 128* 129*  K 4.5 4.8  CL 87* 89*  CO2 21* 24  GLUCOSE 262* 221*  BUN 83* 107*  CREATININE 9.57* 11.43*  CALCIUM 8.2* 8.3*    COAG Lab Results  Component Value Date   INR 1.02 11/06/2017   INR 1.32 07/14/2015   INR 1.25 07/12/2015   No results found for: PTT  Assessment/Planning:  Plan for tunneled dialysis cathter placement after line holiday.  Consent signed and questions answered.  Marty Heck 12/03/2018 7:17 AM --

## 2018-12-03 NOTE — Progress Notes (Signed)
[name] for Infectious Disease    Date of Admission:  11/28/2018   Total days of antibiotics 6 cefazolin          ID: Seth [name] is a 53 y.o. male with MSSA bacteremia associated with ESRD , but also noted to have MRSA & c.albicans on c.tip culture. Had line holiday now new HD catheter in place Principal Problem:   Bacteremia due to methicillin susceptible Staphylococcus aureus (MSSA) Active Problems:   Hyperlipidemia associated with type 2 diabetes mellitus ([place])   Morbid obesity ([place])   Hypertension associated with diabetes ([place])   End stage renal disease on dialysis ([name])   OSA (obstructive sleep apnea)   Hyperkalemia   NSTEMI (non-ST elevated myocardial infarction) ([place])   Acute respiratory failure with hypoxia and hypercapnia ([name])   Hyponatremia    Subjective: Afebrile, undergoing HD without difficulties  24hr events: had new HD placed on 2/23  ROS: 12 point ros is negative. Denies blurry vision. No f/chills/nightsweats or cough Medications:  . aspirin EC  81 mg Oral Daily  . atorvastatin  40 mg Oral q1800  . chlorhexidine  15 mL Mouth Rinse BID  . Chlorhexidine Gluconate Cloth  6 each Topical Q0600  . [START ON 12/04/2018] Chlorhexidine Gluconate Cloth  6 each Topical Q0600  . ferric citrate  630 mg Oral TID WC  . heparin  5,000 Units Subcutaneous Q8H  . insulin aspart      . insulin aspart  0-15 Units Subcutaneous TID WC  . insulin aspart  0-5 Units Subcutaneous QHS  . insulin aspart  6 Units Subcutaneous TID WC  . insulin glargine  22 Units Subcutaneous QHS  . mouth rinse  15 mL Mouth Rinse q12n4p  . mupirocin ointment  1 application Nasal BID    Objective: Vital signs in last 24 hours: Temp:  [97.6 F (36.4 C)-98.2 F (36.8 C)] 97.6 F (36.4 C) (02/24 1240) Pulse Rate:  [74-89] 79 (02/24 1530) Resp:  [18-24] 20 (02/24 1255) BP: (104-152)/(53-70) 119/54 (02/24 1530) SpO2:  [88 %-96 %] 88 % (02/24 1240) Weight:  [145.3 kg] 145.3 kg (02/24  1240)  Physical Exam  Constitutional: He is oriented to person, place, and time. He appears well-developed and well-nourished. No distress.  HENT:  Mouth/Throat: Oropharynx is clear and moist. No oropharyngeal exudate.  Cardiovascular: Normal rate, regular rhythm and normal heart sounds. Exam reveals no gallop and no friction rub.  No murmur heard.  Chest wall: left sided HD catheter. Pulmonary/Chest: Effort normal and breath sounds normal. No respiratory distress. He has no wheezes.  Abdominal: Soft. Bowel sounds are normal. He exhibits no distension. There is no tenderness.  Lymphadenopathy:  He has no cervical adenopathy.  Neurological: He is alert and oriented to person, place, and time.  Skin: Skin is warm and dry. No rash noted. No erythema.  Psychiatric: He has a normal mood and affect. His behavior is normal.     Lab Results Recent Labs    12/02/18 0326 12/03/18 0324  WBC 15.3* 14.5*  HGB 11.1* 11.9*  HCT 35.8* 37.8*  NA 128* 129*  K 4.5 4.8  CL 87* 89*  CO2 21* 24  BUN 83* 107*  CREATININE 9.57* 11.43*   Liver Panel Recent Labs    12/03/18 0324  ALBUMIN 3.0*    Microbiology: 2/22 blood cx ngtd 2/21 cath tip MRSA, c.albicans 2/19 blood cx MSSA Studies/Results: Dg Chest Port 1 View  Result Date: 12/03/2018 CLINICAL DATA:  53 year old  male status post placement of a left-sided tunneled hemodialysis catheter. EXAM: PORTABLE CHEST 1 VIEW COMPARISON:  Prior chest x-ray 11/29/2018 FINDINGS: Interval removal of the right tunneled hemodialysis catheter and placement of a left-sided tunneled hemodialysis catheter via an internal jugular venous approach. The catheter tip overlies the distal SVC. Stable cardiomegaly. Moderate pulmonary vascular congestion without overt pulmonary edema. Small volume fluid layers along the minor fissure. No evidence of pneumothorax or other complication. No acute osseous abnormality. Surgical changes of prior thoracic stabilization surgery.  IMPRESSION: 1. New left IJ approach tunneled hemodialysis catheter with the catheter tip overlying the distal SVC. 2. No evidence of immediate complication. 3. Pulmonary vascular congestion without overt pulmonary edema. Electronically Signed   By: Jacqulynn Cadet M.D.   On: 12/03/2018 09:32     Assessment/Plan: MSSA bacteremia  - likely secondary to his HD catehter, would still recommend getting TEE to evaluated for endocarditis. Continue on cefazolin with HD adminstration  Relevance of cath tip cx = can have colonization of cath tip. His isolates of MRSA and c.albicans have been found on cath tip cx but not on other cx. Will continue on cefazolin and await results of repeat blood cx. He appears non-toxic, afebrile. If he clinically changes - becomes unstable, then will change to vancomycin plus fluconazole.  Leukocytosis = appears to be baseline. Will continue to monitor  [facility] Cell: [phone] Pager: [phone]  12/03/2018, 4:01 PM

## 2018-12-03 NOTE — Progress Notes (Signed)
Spoke with dr ellender/mda re: cbg 301 / orders rec'd

## 2018-12-03 NOTE — Anesthesia Procedure Notes (Signed)
Procedure Name: LMA Insertion Date/Time: 12/03/2018 7:54 AM Performed by: Julieta Bellini, CRNA Pre-anesthesia Checklist: Patient identified, Emergency Drugs available, Suction available and Patient being monitored Patient Re-evaluated:Patient Re-evaluated prior to induction Oxygen Delivery Method: Circle system utilized Preoxygenation: Pre-oxygenation with 100% oxygen Induction Type: IV induction LMA: LMA inserted LMA Size: 5.0 Number of attempts: 1 Placement Confirmation: positive ETCO2 and breath sounds checked- equal and bilateral Tube secured with: Tape Dental Injury: Teeth and Oropharynx as per pre-operative assessment  Comments: Performed by Ruben Gottron

## 2018-12-03 NOTE — Op Note (Addendum)
OPERATIVE NOTE  PROCEDURE: 1. Left internal jugular vein cannulation under ultrasound guidance 2. Left internal jugular vein tunneled dialysis catheter placement (28 cm palindrome catheter)  PRE-OPERATIVE DIAGNOSIS: end-stage renal failure  POST-OPERATIVE DIAGNOSIS: same as above  SURGEON: Marty Heck, MD  ANESTHESIA: LMA  ESTIMATED BLOOD LOSS: 30 cc  FINDING(S): 1.  Tips of the catheter in the right atrium on fluoroscopy 2.  No obvious pneumothorax on fluoroscopy  SPECIMEN(S):  none  INDICATIONS:   Seth [name] is a 53 y.o. male who presents with end stage renal disease and recent MSSA bacteremia with RIJ tunneled catheter in place.  The catheter was removed last week with line holiday through the weekend.  The patient presents for tunneled dialysis catheter replacement today.  The patient is aware the risks of tunneled dialysis catheter placement include but are not limited to: bleeding, infection, central venous injury, pneumothorax, possible venous stenosis, possible malpositioning in the venous system, and possible infections related to long-term catheter presence.  The patient was aware of these risks and agreed to proceed.  DESCRIPTION: After written full informed consent was obtained from the patient, the patient was taken back to the operating room.  Prior to induction, the patient was given IV antibiotics.  After obtaining adequate sedation, the patient was prepped and draped in the standard fashion for a chest or neck tunneled dialysis catheter placement.   A prep timeout was performed to identify patient, procedure, and site.  The cannulation site, the catheter exit site, and tract for the subcutaneous tunnel were then anesthestized with a total of 10 cc of 1% lidocaine without epinephrine.  Under ultrasound guidance, the left internal jugular vein was evaluated, it was patent, an image was saved.  Under ultrasound guidance the left internal jugular vein was  cannulated with the 18 gauge needle.  A J-wire was then placed down into the right atrium under fluoroscopic guidance.  The wire was then secured in place with a clamp to the drapes.  I then made stab incisions at the neck and exit sites after measuring the catheter length on the chest wall.   I dissected from the exit site to the cannulation site with a tunneler.   A 28 cm Palindrome catheter was selected, flushed, transected and tunneled retrograde across the chest wall with the tunneler.  I then unclamped and I removed the needle.  The skin tract and venotomy was dilated serially with dilators.  Finally, the dilator-sheath was placed under fluoroscopic guidance into the superior vena cava.  The dilator and wire were removed.  A 28 cm Diatek catheter was placed under fluoroscopic guidance down into the right atrium.  The sheath was broken and peeled away while holding the catheter cuff at the level of the skin.  The catheter was transected a second time, revealing the two lumens of this catheter.  The ports were docked onto these two lumens.  The catheter collar was then snapped into place.  Each port was tested by aspirating and flushing.  No resistance was noted.  Each port was then thoroughly flushed with heparinized saline.  The catheter was secured in placed with two interrupted stitches of 3-0 Nylon tied to the catheter.  The neck incision was closed with a U-stitch of 4-0 Monocryl.  The neck and chest incision were cleaned and sterile bandages applied.  Each port was then loaded with concentrated heparin (1000 Units/mL) at the manufacturer recommended volumes to each port.  Sterile caps were applied  to each port.    On completion fluoroscopy, the tips of the catheter were in the right atrium, and there was no evidence of pneumothorax.  COMPLICATIONS: None  CONDITION: Stable  Marty Heck, MD [facility] Office: [phone] Pager:  [phone]   12/03/2018, 8:33 AM

## 2018-12-03 NOTE — Progress Notes (Signed)
   Subjective: Continues to feel well. Tolerated tunneled HD catheter procedure well this morning. Pain is well controlled. No new symptoms. Discussed plan for TEE and MR spine in the coming days. Has HD this afternoon. Will do bedside knee ultrasound to evaluate for effusion.   Objective:  Vital signs in last 24 hours: Vitals:   12/03/18 0900 12/03/18 0915 12/03/18 0930 12/03/18 1229  BP:  122/60 (!) 133/54 133/63  Pulse: 79 82 86 89  Resp: 19 (!) 24 18 18   Temp:      TempSrc:      SpO2: 94% 90% 92% 91%  Weight:      Height:       Gen: obese male sitting on edge of bed, NAD, alert and talkative Pulm: CTAB, breathing comfortably on RA MSK: left tunneled HC catheter in place, dressing is clean and dry, not TTP  Antibiotics: Azithromycin 2/19-2/19 Ceftriaxone 2/19-2/19 Cefazolin 2/20 - present   Micro:  2/19 BC x2: Staph aureus, pan sensitive   2/21 Cath Tip culture: MRSA, candida albicans   2/22 repeat BC: NG at 1d   Assessment/Plan:  Principal Problem:   Bacteremia due to methicillin susceptible Staphylococcus aureus (MSSA) Active Problems:   Hyperlipidemia associated with type 2 diabetes mellitus ([place])   Morbid obesity ([place])   Hypertension associated with diabetes ([place])   End stage renal disease on dialysis ([place])   OSA (obstructive sleep apnea)   Hyperkalemia   NSTEMI (non-ST elevated myocardial infarction) ([place])   Acute respiratory failure with hypoxia and hypercapnia ([name])   Hyponatremia  [name] with HTN, hyperlipidemia, type 2 diabetes, ESRD on MWF HD, OSA on cpap, who presented with hyperkalemia 7.1 after missing one HD session, MSSA bacteremia, acute hypoxic respiratory failure with respiratory acidosis and mixed anion gap metabolic acidosis.   ESRD on HD: Potassium normal today.  Left tunneled IJ catheter placed this morning. Appreciate nephro and vascular surgery's assistance.  - HD today  MSSA Bacteremia:  Leukocytosis has been slow to resolve but he  remains afebrile and feels well. VSS. Removed right IJ catheter 2/21.  Catheter tip culture now growing MRSA which is odd since his original blood cultures grew MSSA. Repeat blood cultures obtained after catheter was removed have been no growth at one day. - appreciate ID and nephrology's assistance  - continue cefazolin, f/u ID recs  - f/u repeat blood cultures  - MR thoracic and lumbar spine +/- right knee once patient is more stable. May need ativan for MR - bedside knee ultrasound today  - consult cardiology for TEE  Type 2 DM: CBGs in the upper 200s and low 300s  - increase lantus from 15U to 22U qhs - add novolog 6U tid wc - continue SSI moderate  OSA, OHS: maintaining oxygen saturations > 90% on RA during my evaluation. Does not wear home oxygen  - cpap qhs  -  during the day, wean as toleratead    HTN: holding home amlodipine HLD: continue home statin Chronic pain: continue home oxycodone-acetaminophen with hold parameters   Dispo: Anticipated discharge in several days   Isabelle Course, MD 12/03/2018, 12:32 PM Pager: [phone]

## 2018-12-03 NOTE — Care Management Important Message (Signed)
Important Message  Patient Details  Name: Seth [name] MRN: [id number] Date of Birth: April 27, 1966   Medicare Important Message Given:  Yes    Tishanna Dunford 12/03/2018, 4:10 PM

## 2018-12-03 NOTE — Progress Notes (Signed)
Inpatient Diabetes Program Recommendations  AACE/ADA: New Consensus Statement on Inpatient Glycemic Control (2015)  Target Ranges:  Prepandial:   less than 140 mg/dL      Peak postprandial:   less than 180 mg/dL (1-2 hours)      Critically ill patients:  140 - 180 mg/dL   Lab Results  Component Value Date   GLUCAP 301 (H) 12/03/2018   HGBA1C 8.1 (A) 03/28/2018    Review of Glycemic Control Results for Seth [name], Seth [name] (MRN [id number]) as of 12/03/2018 11:30  Ref. Range 12/02/2018 17:07 12/02/2018 21:16 12/03/2018 08:39  Glucose-Capillary Latest Ref Range: 70 - 99 mg/dL 307 (H) 286 (H) 301 (H)   Diabetes history: Type 2 DM Outpatient Diabetes medications: Novolog 0-20 units TID Current orders for Inpatient glycemic control: Lantus 15 units QHS, Novolog 0-15 units TID, Novolog 0-5 units QHS, Novolog 4 units TID  Inpatient Diabetes Program Recommendations:   Noted Decadron 4 mg x1 intra-op, thus anticipate glucose trends to increase.   Last A1C was 8.1 from 03/2018, consider repeating A1C?  Consider increasing Lantus 22 units QHS and increasing meal coverage to Novolog 6 units TID (assuming patient is consuming >50% of meal).  Additionally, when ready to tolerate intake. Consider carb modified diet.   Thanks, Bronson Curb, MSN, RNC-OB Diabetes Coordinator [phone] (8a-5p)

## 2018-12-03 NOTE — Transfer of Care (Signed)
Immediate Anesthesia Transfer of Care Note  Patient: Seth [name]  Procedure(s) Performed: INSERTION OF DIALYSIS CATHETER (Left Chest)  Patient Location: PACU  Anesthesia Type:General  Level of Consciousness: awake, alert , patient cooperative and responds to stimulation  Airway & Oxygen Therapy: Patient Spontanous Breathing and Patient connected to face mask oxygen  Post-op Assessment: Report given to RN, Post -op Vital signs reviewed and stable and Patient moving all extremities X 4  Post vital signs: Reviewed and stable  Last Vitals:  Vitals Value Taken Time  BP 151/62 12/03/2018  8:39 AM  Temp    Pulse 80 12/03/2018  8:41 AM  Resp 25 12/03/2018  8:41 AM  SpO2 99 % 12/03/2018  8:41 AM  Vitals shown include unvalidated device data.  Last Pain:  Vitals:   12/03/18 0515  TempSrc:   PainSc: 5       Patients Stated Pain Goal: 0 (63/01/60 1093)  Complications: No apparent anesthesia complications

## 2018-12-03 NOTE — Procedures (Signed)
I was present at this dialysis session. I have reviewed the session itself and made appropriate changes.   Vital signs in last 24 hours:  Temp:  [97.5 F (36.4 C)-98.2 F (36.8 C)] 97.6 F (36.4 C) (02/24 1240) Pulse Rate:  [74-89] 74 (02/24 1255) Resp:  [16-24] 20 (02/24 1255) BP: (115-151)/(53-95) 133/53 (02/24 1255) SpO2:  [88 %-96 %] 88 % (02/24 1240) Weight:  [145.3 kg] 145.3 kg (02/24 1240) Weight change:  Filed Weights   11/30/18 0704 11/30/18 1016 12/03/18 1240  Weight: (!) 139.3 kg (!) 136.5 kg (!) 145.3 kg    Recent Labs  Lab 12/03/18 0324  NA 129*  K 4.8  CL 89*  CO2 24  GLUCOSE 221*  BUN 107*  CREATININE 11.43*  CALCIUM 8.3*  PHOS 6.3*    Recent Labs  Lab 11/28/18 0808  12/01/18 0602 12/02/18 0326 12/03/18 0324  WBC 17.4*   < > 12.0* 15.3* 14.5*  NEUTROABS 15.0*  --   --   --   --   HGB 10.8*   < > 11.0* 11.1* 11.9*  HCT 35.2*   < > 35.8* 35.8* 37.8*  MCV 97.0   < > 97.5 96.2 96.4  PLT 175   < > 189 206 247   < > = values in this interval not displayed.    Scheduled Meds: . aspirin EC  81 mg Oral Daily  . atorvastatin  40 mg Oral q1800  . chlorhexidine  15 mL Mouth Rinse BID  . Chlorhexidine Gluconate Cloth  6 each Topical Q0600  . [START ON 12/04/2018] Chlorhexidine Gluconate Cloth  6 each Topical Q0600  . ferric citrate  630 mg Oral TID WC  . heparin  5,000 Units Subcutaneous Q8H  . insulin aspart      . insulin aspart  0-15 Units Subcutaneous TID WC  . insulin aspart  0-5 Units Subcutaneous QHS  . insulin aspart  6 Units Subcutaneous TID WC  . insulin glargine  22 Units Subcutaneous QHS  . mouth rinse  15 mL Mouth Rinse q12n4p  . mupirocin ointment  1 application Nasal BID   Continuous Infusions: . albuterol Stopped (11/28/18 1100)  . [START ON 12/05/2018]  ceFAZolin (ANCEF) IV     PRN Meds:.ferric citrate, [START ON 12/04/2018] heparin, heparin, heparin, lidocaine, oxyCODONE-acetaminophen **AND** oxyCODONE     Dialysis:[facility]  [place] MWF [address] 4h 138.5kg Heparin 4500 + 1000/hr R IJ TDC  Assessment and plan:  1. MSSA bacteremia- had cath holiday over weekend now with new tunneled HD cath today.   2. ESRD- continue with HD q MWF 3. CHF/volume overload- UF as tolerated 4. DM on insulin 5. HTN- stable 6. Disposition- hopeful discharge to home soon.  Donetta Potts,  MD 12/03/2018, 1:49 PM

## 2018-12-03 NOTE — Anesthesia Postprocedure Evaluation (Signed)
Anesthesia Post Note  Patient: Seth [name]  Procedure(s) Performed: INSERTION OF DIALYSIS CATHETER (Left Chest)     Patient location during evaluation: PACU Anesthesia Type: General Level of consciousness: awake and alert Pain management: pain level controlled Vital Signs Assessment: post-procedure vital signs reviewed and stable Respiratory status: spontaneous breathing, nonlabored ventilation, respiratory function stable and patient connected to nasal cannula oxygen Cardiovascular status: blood pressure returned to baseline and stable Postop Assessment: no apparent nausea or vomiting Anesthetic complications: no    Last Vitals:  Vitals:   12/03/18 0930 12/03/18 1229  BP: (!) 133/54 133/63  Pulse: 86 89  Resp: 18 18  Temp:    SpO2: 92% 91%    Last Pain:  Vitals:   12/03/18 0930  TempSrc:   PainSc: 0-No pain                 Ryan P Ellender

## 2018-12-04 ENCOUNTER — Inpatient Hospital Stay (HOSPITAL_COMMUNITY): Payer: Medicare Other

## 2018-12-04 ENCOUNTER — Encounter (HOSPITAL_COMMUNITY): Payer: Self-pay | Admitting: Vascular Surgery

## 2018-12-04 DIAGNOSIS — Z01810 Encounter for preprocedural cardiovascular examination: Secondary | ICD-10-CM

## 2018-12-04 LAB — CBC
HCT: 34.4 % — ABNORMAL LOW (ref 39.0–52.0)
HEMOGLOBIN: 10.5 g/dL — AB (ref 13.0–17.0)
MCH: 29.3 pg (ref 26.0–34.0)
MCHC: 30.5 g/dL (ref 30.0–36.0)
MCV: 96.1 fL (ref 80.0–100.0)
Platelets: 281 10*3/uL (ref 150–400)
RBC: 3.58 MIL/uL — ABNORMAL LOW (ref 4.22–5.81)
RDW: 14.4 % (ref 11.5–15.5)
WBC: 16 10*3/uL — AB (ref 4.0–10.5)
nRBC: 0 % (ref 0.0–0.2)

## 2018-12-04 LAB — GLUCOSE, CAPILLARY
Glucose-Capillary: 274 mg/dL — ABNORMAL HIGH (ref 70–99)
Glucose-Capillary: 185 mg/dL — ABNORMAL HIGH (ref 70–99)
Glucose-Capillary: 201 mg/dL — ABNORMAL HIGH (ref 70–99)
Glucose-Capillary: 269 mg/dL — ABNORMAL HIGH (ref 70–99)

## 2018-12-04 MED ORDER — LIDOCAINE HCL (PF) 1 % IJ SOLN: 5.0000 mL | INTRAMUSCULAR | Status: DC | PRN

## 2018-12-04 MED ORDER — POLYETHYLENE GLYCOL 3350 17 G PO PACK: 17.0000 g | PACK | Freq: Every day | ORAL | Status: DC | PRN

## 2018-12-04 MED ORDER — SODIUM CHLORIDE 0.9 % IV SOLN: 100.0000 mL | INTRAVENOUS | Status: DC | PRN

## 2018-12-04 MED ORDER — HEPARIN SODIUM (PORCINE) 1000 UNIT/ML DIALYSIS
1000.0000 [IU] | INTRAMUSCULAR | Status: DC | PRN
  Filled 2018-12-04: qty 1

## 2018-12-04 MED ORDER — HEPARIN SODIUM (PORCINE) 1000 UNIT/ML DIALYSIS: 20.0000 [IU]/kg | INTRAMUSCULAR | Status: DC | PRN

## 2018-12-04 MED ORDER — HEPARIN SODIUM (PORCINE) 1000 UNIT/ML DIALYSIS
9000.0000 [IU] | INTRAMUSCULAR | Status: DC | PRN
  Filled 2018-12-04: qty 9

## 2018-12-04 MED ORDER — LORAZEPAM 2 MG/ML IJ SOLN
1.0000 mg | Freq: Once | INTRAMUSCULAR | Status: AC | PRN
  Administered 2018-12-04: 1 mg via INTRAVENOUS
  Filled 2018-12-04: qty 1

## 2018-12-04 MED ORDER — ALTEPLASE 2 MG IJ SOLR: 2.0000 mg | Freq: Once | INTRAMUSCULAR | Status: DC | PRN

## 2018-12-04 MED ORDER — LIDOCAINE-PRILOCAINE 2.5-2.5 % EX CREA: 1.0000 "application " | TOPICAL_CREAM | CUTANEOUS | Status: DC | PRN

## 2018-12-04 MED ORDER — PENTAFLUOROPROP-TETRAFLUOROETH EX AERO: 1.0000 "application " | INHALATION_SPRAY | CUTANEOUS | Status: DC | PRN

## 2018-12-04 MED ORDER — SENNOSIDES-DOCUSATE SODIUM 8.6-50 MG PO TABS: 1.0000 | ORAL_TABLET | Freq: Every evening | ORAL | Status: DC | PRN

## 2018-12-04 NOTE — Progress Notes (Signed)
Patient ID: OSLO HUNTSMAN, male   DOB: 1966/04/01, 53 y.o.   MRN: [id number]  [facility] Progress Note    Subjective:   Feels well, no complaints   Objective:   BP (!) 154/89 (BP Location: Left Leg)   Pulse 79   Temp (!) 97.5 F (36.4 C) (Oral)   Resp 19   Ht 5\' 7"  (1.702 m)   Wt (!) 141.7 kg   SpO2 92%   BMI 48.93 kg/m   Intake/Output: I/O last 3 completed shifts: In: 1230.1 [P.O.:480; I.V.:400; IV Piggyback:350.1] Out: -3545 [Blood:5]   Intake/Output this shift:  Total I/O In: 240 [P.O.:240] Out: -  Weight change:   Physical Exam: Gen: NAD CVS: no rub Resp: cta Abd: obese, +BS, soft, NT Ext: no edema  Labs: BMET Recent Labs  Lab 11/28/18 0808  11/28/18 2220 11/29/18 0146 11/29/18 1301 11/30/18 0251 12/01/18 0602 12/02/18 0326 12/03/18 0324  NA 124*   < > 131* 130* 129* 131* 128* 128* 129*  K 7.1*   < > 5.4* 5.6* 5.4* 4.9 4.6 4.5 4.8  CL 85*   < > 90* 90* 88* 91* 87* 87* 89*  CO2 17*   < > 25 22 24 24 24  21* 24  GLUCOSE 298*   < > 149* 156* 197* 183* 306* 262* 221*  BUN 90*   < > 51* 54* 64* 43* 54* 83* 107*  CREATININE 13.88*   < > 9.93* 10.67* 11.43* 8.30* 7.50* 9.57* 11.43*  ALBUMIN 3.1*  --   --   --  2.8*  --   --   --  3.0*  CALCIUM 8.5*   < > 8.2* 8.1* 7.4* 7.9* 7.9* 8.2* 8.3*  PHOS  --   --   --   --  5.8*  --  5.2*  --  6.3*   < > = values in this interval not displayed.   CBC Recent Labs  Lab 11/28/18 0808  11/30/18 0251 12/01/18 0602 12/02/18 0326 12/03/18 0324  WBC 17.4*   < > 14.2* 12.0* 15.3* 14.5*  NEUTROABS 15.0*  --   --   --   --   --   HGB 10.8*   < > 10.9* 11.0* 11.1* 11.9*  HCT 35.2*   < > 36.6* 35.8* 35.8* 37.8*  MCV 97.0   < > 98.1 97.5 96.2 96.4  PLT 175   < > 158 189 206 247   < > = values in this interval not displayed.    @IMGRELPRIORS @ Medications:    . aspirin EC  81 mg Oral Daily  . atorvastatin  40 mg Oral q1800  . chlorhexidine  15 mL Mouth Rinse BID  . Chlorhexidine Gluconate Cloth   6 each Topical Q0600  . Chlorhexidine Gluconate Cloth  6 each Topical Q0600  . ferric citrate  630 mg Oral TID WC  . heparin  5,000 Units Subcutaneous Q8H  . insulin aspart  0-15 Units Subcutaneous TID WC  . insulin aspart  0-5 Units Subcutaneous QHS  . insulin aspart  6 Units Subcutaneous TID WC  . insulin glargine  22 Units Subcutaneous QHS  . mouth rinse  15 mL Mouth Rinse q12n4p  . mupirocin ointment  1 application Nasal BID   Dialysis:[facility] MWF [address] 4h 138.5kg Heparin 4500 + 1000/hr L IJ TDC  Assessment/ Plan:   1. MSSA bacteremia- had catheter holiday over weekend and new RIJ tdc placed yesterday prior to HD.  TEE pending  to r/o SBE. Continue ancef per ID 2. ESRD continue with HD q MWF 3. Anemia: stable Hgb 4. CHF/volume overload- cont to UF with HD to edw  5. CKD-MBD: phos mildly elevated, will need to initiate binders (was taking [place] as an outpatient). 6. Nutrition: renal diet 7. Hypertension: stable 8. Vascular access- new LIJ TDC placed 12/03/18  Donetta Potts, MD [place] Pager [phone] 12/04/2018, 10:48 AM

## 2018-12-04 NOTE — Progress Notes (Signed)
OT Cancellation Note  Patient Details Name: Seth [name] MRN: [id number] DOB: April 04, 1966   Cancelled Treatment:     Pt declined OT intervention this session because he wanted to eat before doing therapy. OT will attempt to see pt again when available.   Gypsy Decant, MS, OTR/L 12/04/2018, 3:24 PM

## 2018-12-04 NOTE — Progress Notes (Addendum)
Physical Therapy Treatment Patient Details Name: Seth [name] MRN: [id number] DOB: [birth year] Today's Date: 12/04/2018    History of Present Illness 1yoM with HTN, hyperlipidemia, type 2 diabetes, ESRD on MWF HD, OSA on cpap, who presented to the hospital on 11/28/18 with hyperkalemia 7.1 after missing one HD session, MSSA bacteremia (likely due to infected R IJ HD catheter (removed by vascular surgery 11/30/18), acute hypoxic respiratory failure with respiratory acidosis and mixed anion gap metabolic acidosis. L internal jugular vein tunneled dialysis catherter placement on 12/03/18.      PT Comments    Pt is making great progress with his mobility/gait tolerance, walking the unit with RW.  O2 sats dipped to 88% on RA during mobility, however, he rebounded quickly with standing rest break and pursed lip breathing.  Pt is excited to continue to progress his mobility and return home.  He is no longer appropriate for CIR level therapies.  PT will continue to follow acutely for safe mobility progression  Follow Up Recommendations  Home health PT     Equipment Recommendations  Rolling walker with 5" wheels    Recommendations for Other Services   NA     Precautions / Restrictions Precautions Precautions: Fall;Other (comment) Precaution Comments: monitor O2 sats with mobility.     Mobility  Bed Mobility               General bed mobility comments: Pt seated EOB when PT enetered the room.   Transfers Overall transfer level: Needs assistance Equipment used: Rolling walker (2 wheeled) Transfers: Sit to/from Stand Sit to Stand: Supervision         General transfer comment: close supervision for safety as it took pt multiple attempts to get to standing and stabilize his balance from low bed.   Ambulation/Gait Ambulation/Gait assistance: Min guard Gait Distance (Feet): 150 Feet Assistive device: Rolling walker (2 wheeled) Gait Pattern/deviations: Step-through pattern      General Gait Details: Pt self cueing light hands, and upright posture.  Standing rest break x2 for pursed lip breathing.  Pt's O2 sats dropped to 88% on RA during gait, but quickly rebounded (<30 seconds) with standing rest break and pursed lip breathing.     Stairs Stairs: Yes       General stair comments: We discussed that he has at least two stairs to enter his home with handrails.  We will practice in future sessions before he d/c home.           Balance Overall balance assessment: Needs assistance Sitting-balance support: Feet supported;No upper extremity supported Sitting balance-Leahy Scale: Good     Standing balance support: Bilateral upper extremity supported;No upper extremity supported;Single extremity supported Standing balance-Leahy Scale: Fair                              Cognition Arousal/Alertness: Awake/alert Behavior During Therapy: WFL for tasks assessed/performed Overall Cognitive Status: Within Functional Limits for tasks assessed                                               Pertinent Vitals/Pain Pain Assessment: No/denies pain           PT Goals (current goals can now be found in the care plan section) Acute Rehab PT Goals Patient Stated Goal: get stronger return home  Progress towards PT goals: Progressing toward goals    Frequency    Min 3X/week      PT Plan Discharge plan needs to be updated       AM-PAC PT "6 Clicks" Mobility   Outcome Measure  Help needed turning from your back to your side while in a flat bed without using bedrails?: None Help needed moving from lying on your back to sitting on the side of a flat bed without using bedrails?: A Little Help needed moving to and from a bed to a chair (including a wheelchair)?: None Help needed standing up from a chair using your arms (e.g., wheelchair or bedside chair)?: None Help needed to walk in hospital room?: A Little Help needed climbing 3-5  steps with a railing? : A Little 6 Click Score: 21    End of Session Equipment Utilized During Treatment: Gait belt Activity Tolerance: Patient limited by fatigue Patient left: in chair;with call bell/phone within reach   PT Visit Diagnosis: Unsteadiness on feet (R26.81)     Time: 2111-5520 PT Time Calculation (min) (ACUTE ONLY): 19 min  Charges:  $Gait Training: 8-22 mins                       Tashyra Adduci B. Jamae Tison, PT, DPT  Acute Rehabilitation [phone] pager #[phone] office   12/04/2018, 3:13 PM

## 2018-12-04 NOTE — Progress Notes (Signed)
Subjective: No new complaints. Had HD yesterday via new LIJ tunneled catheter. He has been walking the halls with his walker and his right knee is feeling better. Discussed plan to try and get MRI T and L spine today. Stated that he gets very claustrophobic in the MR machine and will need some anxiolytic therapy. Will order prn ativan to be given pre-MRI.   Objective:  Vital signs in last 24 hours: Vitals:   12/03/18 1809 12/03/18 2332 12/03/18 2337 12/04/18 0720  BP: (!) 157/99  (!) 128/58 (!) 154/89  Pulse: 83 82 79 79  Resp: 18 (!) 22 10 19   Temp: 98.3 F (36.8 C)  98 F (36.7 C) (!) 97.5 F (36.4 C)  TempSrc: Oral  Oral Oral  SpO2: 91% 97% 95% 92%  Weight:      Height:       Gen: obese male sitting on edge of bed, NAD, alert and talkative Pulm: CTAB, breathing comfortably on RA MSK: left tunneled HC catheter in place, dressing is clean and dry, not TTP. Right knee is less swollen today without tenderess, warmth, or erythema.   Antibiotics: Azithromycin 2/19-2/19 Ceftriaxone 2/19-2/19 Cefazolin 2/20 - present   Micro:  2/19 BC x2: Staph aureus, pan sensitive   2/21 Cath Tip culture: MRSA, candida albicans   2/22 repeat BC: NG at 2d   Assessment/Plan:  Principal Problem:   Bacteremia due to methicillin susceptible Staphylococcus aureus (MSSA) Active Problems:   Hyperlipidemia associated with type 2 diabetes mellitus ([place])   Morbid obesity ([place])   Hypertension associated with diabetes ([place])   End stage renal disease on dialysis ([place])   OSA (obstructive sleep apnea)   Hyperkalemia   NSTEMI (non-ST elevated myocardial infarction) ([place])   Acute respiratory failure with hypoxia and hypercapnia ([name])   Hyponatremia  [name] with HTN, hyperlipidemia, type 2 diabetes, ESRD on MWF HD, OSA on cpap, who presented with hyperkalemia 7.1 after missing one HD session, MSSA bacteremia, acute hypoxic respiratory failure with respiratory acidosis and mixed anion gap metabolic  acidosis.   ESRD on HD: Left tunneled IJ catheter used in HD yesterday without issues. Vascular surgery working on vein mapping to plan for permanent access - HD per nephro  MSSA Bacteremia: Stable. Remains afebrile with good vital signs. Feels well. Repeat blood cultures after infected RIJ catheter was removed are now no growth at 2 days. Needs imaging of spine and heart. Bedside ultrasound of the right knee yesterday was concerning for joint effusion with hyper and hypoechoic fluid. However, his knee pain is improved today after walking and on exam, no effusion is appreciated with palpation. There is no increased redness or warmth.  - appreciate ID and nephrology's assistance  - continue cefazolin - f/u repeat blood cultures  - MR thoracic and lumbar spine without contrast, ordered one time dose of IV ativan 1 mg to be given for MRI - Will forgo knee MRI or aspiration at this time given improvement in symptoms - cardiology planning for TEE, date to be determined   Type 2 DM: CBGs better controlled with increasing lantus - lantus 22U qhs - novolog 6U tid wc - continue SSI moderate  OSA, OHS: maintaining oxygen saturations > 90% on RA during my evaluation. Does not wear home oxygen  - cpap qhs  - [place] during the day, wean as toleratead    HTN: holding home amlodipine HLD: continue home statin Chronic pain: continue home oxycodone-acetaminophen with hold parameters   Dispo: Anticipated discharge in  several days   Isabelle Course, MD 12/04/2018, 11:49 AM Pager: [phone]

## 2018-12-04 NOTE — Progress Notes (Signed)
Patient may be a candidate for a  Repeat attempt at a basilic vein fistula, but will likely need a AVGG.  Therefore, all infectious issues will need to be resolved before attempting a new left arm dialysis access.  Annamarie Major
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
## 2018-12-04 NOTE — Progress Notes (Signed)
[facility]  Subjective  - No complaints.  LIJ tunneled catheter worked in dialysis yesterday.   Objective (!) 154/89 79 (!) 97.5 F (36.4 C) (Oral) 19 92%  Intake/Output Summary (Last 24 hours) at 12/04/2018 0819 Last data filed at 12/03/2018 2300 Gross per 24 hour  Intake 990.12 ml  Output -3495 ml  Net 4485.12 ml    LIJ tunneled catheter   Laboratory Lab Results: Recent Labs    12/02/18 0326 12/03/18 0324  WBC 15.3* 14.5*  HGB 11.1* 11.9*  HCT 35.8* 37.8*  PLT 206 247   BMET Recent Labs    12/02/18 0326 12/03/18 0324  NA 128* 129*  K 4.5 4.8  CL 87* 89*  CO2 21* 24  GLUCOSE 262* 221*  BUN 83* 107*  CREATININE 9.57* 11.43*  CALCIUM 8.2* 8.3*    COAG Lab Results  Component Value Date   INR 1.02 11/06/2017   INR 1.32 07/14/2015   INR 1.25 07/12/2015   No results found for: PTT  Assessment/Planning:  POD#1 s/p LIJ tunneled catheter that is working well.  Blood cultures growing MSSA.  Has TEE pending per medicine notes and MR T/L spine.  Will allow ongoing infectious work-up prior to placement of new access as documented by Dr. Trula Slade in case needs graft.  Appears new vein mapping ordered.    Marty Heck 12/04/2018 8:19 AM --

## 2018-12-04 NOTE — Progress Notes (Signed)
  Date: 12/04/2018  Patient name: JHONATHAN DESROCHES  Medical record number: [id number]  Date of birth: [birth year]   I have seen and evaluated this patient and I have discussed the plan of care with the house staff. Please see their note for complete details. I concur with their findings with the following additions/corrections:   Mr. Remache reports doing well today.  He was able to be dialyzed yesterday through the new temporary dialysis catheter.  We are continuing IV cefazolin for his MSSA bacteremia.  Appreciate ID consultation and recommendations.  Although his catheter tip culture grew MRSA and Candida, he seems to have responded well to narrow antibiotics and no need to broaden at this time.  If he has clinical worsening, we would consider adding on vancomycin and antifungal coverage.  He still needs TEE for evaluation of his heart valves and an MRI of his spine given his history of epidural abscess and chronic back pain.  We had discussed previously evaluating his right knee for possible septic arthritis.  At this time, I do not believe this is warranted as he reports the knee pain is chronic, actually slightly better than usual, and on exam, has never felt warm or been red.  He did have a small effusion of the right knee earlier this hospitalization, which we were able to visualize on bedside ultrasound yesterday, but today the effusion is not detectable on exam and may be too small to effectively aspirate given his obesity.  In any case, given his clinical improvement, I do not know that further investigation is warranted at this time.  We will discuss further with infectious disease.  Vascular surgery is evaluating options for more permanent dialysis access but would like his bacteremia well treated and fully resolved before any further procedures.  Overall volume status and respiratory status appears good after HD yesterday.  He should continue CPAP at night for his OSA.  His sugars have  been creeping up to his hospitalization, and we will adjust his regimen and ensure he goes home on an appropriate diabetes regimen.  Lenice Pressman, M.D., Ph.D. 12/04/2018, 1:04 PM

## 2018-12-04 NOTE — Care Management Important Message (Signed)
Important Message  Patient Details  Name: Seth [name] MRN: [id number] Date of Birth: [birth year]   Medicare Important Message Given:  Yes    Zenon Mayo, RN 12/04/2018, 11:49 AM

## 2018-12-04 NOTE — Progress Notes (Signed)
[facility] for Infectious Disease    Date of Admission:  11/28/2018   Total days of antibiotics 7           ID: Seth [name] is a 53 y.o. male with MSSA bacteremia 2/2 HD catheter but also concerning for risk of endocarditis +/- discitis Principal Problem:   Bacteremia due to methicillin susceptible Staphylococcus aureus (MSSA) Active Problems:   Hyperlipidemia associated with type 2 diabetes mellitus ([place])   Morbid obesity ([facility])   Hypertension associated with diabetes ([place])   End stage renal disease on dialysis ([place])   OSA (obstructive sleep apnea)   Controlled type 2 diabetes mellitus with chronic kidney disease on chronic dialysis ([name])   Hyperkalemia   NSTEMI (non-ST elevated myocardial infarction) ([place])   Acute respiratory failure with hypoxia and hypercapnia ([name])   Hyponatremia    Subjective: Patient remains afebrile. Tolerated HD with new temp line without difficulty. Reports still having ongoing back pain. Denies worsening knee pain  Medications:  . aspirin EC  81 mg Oral Daily  . atorvastatin  40 mg Oral q1800  . chlorhexidine  15 mL Mouth Rinse BID  . Chlorhexidine Gluconate Cloth  6 each Topical Q0600  . Chlorhexidine Gluconate Cloth  6 each Topical Q0600  . ferric citrate  630 mg Oral TID WC  . heparin  5,000 Units Subcutaneous Q8H  . insulin aspart  0-15 Units Subcutaneous TID WC  . insulin aspart  0-5 Units Subcutaneous QHS  . insulin aspart  6 Units Subcutaneous TID WC  . insulin glargine  22 Units Subcutaneous QHS  . mouth rinse  15 mL Mouth Rinse q12n4p  . mupirocin ointment  1 application Nasal BID    Objective: Vital signs in last 24 hours: Temp:  [97.5 F (36.4 C)-98 F (36.7 C)] 97.5 F (36.4 C) (02/25 1642) Pulse Rate:  [74-82] 74 (02/25 1642) Resp:  [10-22] 19 (02/25 1642) BP: (128-154)/(58-89) 152/87 (02/25 1642) SpO2:  [92 %-97 %] 95 % (02/25 1642) Physical Exam  Constitutional: He is oriented to person, place, and time. He  appears well-developed and well-nourished sitting in chair eating snack. No distress.  HENT:  Mouth/Throat: Oropharynx is clear and moist. No oropharyngeal exudate.  Cardiovascular: Normal rate, regular rhythm and normal heart sounds. Exam reveals no gallop and no friction rub.  No murmur heard.  Pulmonary/Chest: Effort normal and breath sounds normal. No respiratory distress. He has no wheezes.  Abdominal: Soft. Bowel sounds are normal. He exhibits no distension. There is no tenderness.  Lymphadenopathy:  He has no cervical adenopathy.  Neurological: He is alert and oriented to person, place, and time.  Skin: Skin is warm and dry. No rash noted. No erythema.  Psychiatric: He has a normal mood and affect. His behavior is normal.     Lab Results Recent Labs    12/02/18 0326 12/03/18 0324 12/04/18 1156  WBC 15.3* 14.5* 16.0*  HGB 11.1* 11.9* 10.5*  HCT 35.8* 37.8* 34.4*  NA 128* 129*  --   K 4.5 4.8  --   CL 87* 89*  --   CO2 21* 24  --   BUN 83* 107*  --   CREATININE 9.57* 11.43*  --    Liver Panel Recent Labs    12/03/18 0324  ALBUMIN 3.0*    Microbiology: reviewd Studies/Results: Dg Chest Port 1 View  Result Date: 12/03/2018 CLINICAL DATA:  53 year old male status post placement of a left-sided tunneled hemodialysis catheter. EXAM: PORTABLE CHEST  1 VIEW COMPARISON:  Prior chest x-ray 11/29/2018 FINDINGS: Interval removal of the right tunneled hemodialysis catheter and placement of a left-sided tunneled hemodialysis catheter via an internal jugular venous approach. The catheter tip overlies the distal SVC. Stable cardiomegaly. Moderate pulmonary vascular congestion without overt pulmonary edema. Small volume fluid layers along the minor fissure. No evidence of pneumothorax or other complication. No acute osseous abnormality. Surgical changes of prior thoracic stabilization surgery. IMPRESSION: 1. New left IJ approach tunneled hemodialysis catheter with the catheter tip  overlying the distal SVC. 2. No evidence of immediate complication. 3. Pulmonary vascular congestion without overt pulmonary edema. Electronically Signed   By: Jacqulynn Cadet M.D.   On: 12/03/2018 09:32   Vas [place] Upper Ext Vein Mapping (pre-op Avf)  Result Date: 12/04/2018 UPPER EXTREMITY VEIN MAPPING  Indications: Pre-access. Performing Technologist: Toma Copier RVS  Examination Guidelines: A complete evaluation includes B-mode imaging, spectral Doppler, color Doppler, and power Doppler as needed of all accessible portions of each vessel. Bilateral testing is considered an integral part of a complete examination. Limited examinations for reoccurring indications may be performed as noted. +-----------------+-------------+----------+----------+ Left Cephalic    Diameter (cm)Depth (cm) Findings  +-----------------+-------------+----------+----------+ Shoulder             0.23        0.91              +-----------------+-------------+----------+----------+ Prox upper arm       0.24        0.47   branching  +-----------------+-------------+----------+----------+ Mid upper arm        0.09        0.32              +-----------------+-------------+----------+----------+ Dist upper arm       0.14        0.63              +-----------------+-------------+----------+----------+ Antecubital fossa    0.27        0.39              +-----------------+-------------+----------+----------+ Prox forearm         0.28        0.95              +-----------------+-------------+----------+----------+ Mid forearm          0.16        0.63              +-----------------+-------------+----------+----------+ Wrist                0.08        0.39   Thrombosed +-----------------+-------------+----------+----------+ +-----------------+-------------+----------+---------------+ Left Basilic     Diameter (cm)Depth (cm)   Findings      +-----------------+-------------+----------+---------------+ Mid upper arm        0.49        2.38   enters brachial +-----------------+-------------+----------+---------------+ Dist upper arm       0.47        1.96                   +-----------------+-------------+----------+---------------+ Antecubital fossa    0.27        0.65      branching    +-----------------+-------------+----------+---------------+ Prox forearm         0.30        0.47      branching    +-----------------+-------------+----------+---------------+ Mid forearm          0.24  0.24                   +-----------------+-------------+----------+---------------+ Wrist                0.12        0.26                   +-----------------+-------------+----------+---------------+ *See table(s) above for measurements and observations.  Diagnosing physician: Harold Barban MD Electronically signed by Harold Barban MD on 12/04/2018 at 7:08:40 PM.    Final      Assessment/Plan: Complicated MSSA bacteremia thought to 2/2 the HD catheter - though the catheter tip cx showed MRSA and c.albicans. interesting those 2 isolates not found in peripheral cx. Continue on cefazolin. Plan to treat as complicated bacteremia. But  Course yet to be determined since need for TEE and MR imaging of spine to see if any signs of discitis   [facility] for Infectious Diseases Cell: [phone] Pager: [phone]  12/04/2018, 10:25 PM

## 2018-12-04 NOTE — Progress Notes (Signed)
Left upper extremity vein mapping completed. Preliminary resukts in Chart review CV Proc. [place] Weslee Prestage,RVS 12/04/2018, 12:19 PM

## 2018-12-05 ENCOUNTER — Inpatient Hospital Stay (HOSPITAL_COMMUNITY): Payer: Medicare Other

## 2018-12-05 ENCOUNTER — Encounter (HOSPITAL_COMMUNITY): Payer: Self-pay | Admitting: Radiology

## 2018-12-05 LAB — RENAL FUNCTION PANEL
ANION GAP: 18 — AB (ref 5–15)
Albumin: 3 g/dL — ABNORMAL LOW (ref 3.5–5.0)
BUN: 107 mg/dL — ABNORMAL HIGH (ref 6–20)
CALCIUM: 8.6 mg/dL — AB (ref 8.9–10.3)
CO2: 20 mmol/L — ABNORMAL LOW (ref 22–32)
Chloride: 93 mmol/L — ABNORMAL LOW (ref 98–111)
Creatinine, Ser: 10.23 mg/dL — ABNORMAL HIGH (ref 0.61–1.24)
GFR calc Af Amer: 6 mL/min — ABNORMAL LOW (ref >60)
GFR calc non Af Amer: 5 mL/min — ABNORMAL LOW (ref >60)
Glucose, Bld: 209 mg/dL — ABNORMAL HIGH (ref 70–99)
Phosphorus: 5.9 mg/dL — ABNORMAL HIGH (ref 2.5–4.6)
Potassium: 4.7 mmol/L (ref 3.5–5.1)
SODIUM: 131 mmol/L — AB (ref 135–145)

## 2018-12-05 LAB — CBC
HCT: 35.2 % — ABNORMAL LOW (ref 39.0–52.0)
Hemoglobin: 10.9 g/dL — ABNORMAL LOW (ref 13.0–17.0)
MCH: 29.7 pg (ref 26.0–34.0)
MCHC: 31 g/dL (ref 30.0–36.0)
MCV: 95.9 fL (ref 80.0–100.0)
Platelets: 299 10*3/uL (ref 150–400)
RBC: 3.67 MIL/uL — AB (ref 4.22–5.81)
RDW: 14.7 % (ref 11.5–15.5)
WBC: 13.2 10*3/uL — ABNORMAL HIGH (ref 4.0–10.5)
nRBC: 0 % (ref 0.0–0.2)

## 2018-12-05 LAB — GLUCOSE, CAPILLARY
Glucose-Capillary: 158 mg/dL — ABNORMAL HIGH (ref 70–99)
Glucose-Capillary: 245 mg/dL — ABNORMAL HIGH (ref 70–99)
Glucose-Capillary: 249 mg/dL — ABNORMAL HIGH (ref 70–99)
Glucose-Capillary: 182 mg/dL — ABNORMAL HIGH (ref 70–99)

## 2018-12-05 MED ORDER — LORAZEPAM 2 MG/ML IJ SOLN
1.0000 mg | Freq: Once | INTRAMUSCULAR | Status: AC
  Administered 2018-12-05: 1 mg via INTRAVENOUS
  Filled 2018-12-05: qty 1

## 2018-12-05 MED ORDER — HEPARIN SODIUM (PORCINE) 1000 UNIT/ML IJ SOLN
INTRAMUSCULAR | Status: AC
  Administered 2018-12-05: 14:00:00
  Filled 2018-12-05: qty 4

## 2018-12-05 MED ORDER — LORAZEPAM 0.5 MG PO TABS
1.0000 mg | ORAL_TABLET | Freq: Once | ORAL | Status: AC | PRN
  Administered 2018-12-05: 1 mg via ORAL
  Filled 2018-12-05: qty 2

## 2018-12-05 NOTE — Progress Notes (Signed)
OT Cancellation    12/05/18 1000  OT Visit Information  Last OT Received On 12/05/18  Reason Eval/Treat Not Completed Patient at procedure or test/ unavailable (HD)  Maurie Boettcher, OT/L   Acute OT Clinical Specialist [place] Pager [phone] Office [phone]

## 2018-12-05 NOTE — Progress Notes (Signed)
  Date: 12/05/2018  Patient name: Seth [name]  Medical record number: [id number]  Date of birth: [birth year]   I have seen and evaluated this patient and I have discussed the plan of care with the house staff. Please see their note for complete details. I concur with their findings with the following additions/corrections:   Doing well on IV cefazolin for his MSSA bacteremia.  We will try again to get an MRI of his spine using oral lorazepam that may last a little bit longer.  We are also awaiting TEE for further evaluation for endocarditis.  Once these evaluations are complete, he likely will be ready for discharge.  Lenice Pressman, M.D., Ph.D. 12/05/2018, 2:43 PM

## 2018-12-05 NOTE — Progress Notes (Signed)
Pharmacy Antibiotic Note  Seth [name] is a 53 y.o. male admitted on 11/28/2018 with CAP,  BCID showed MSSA bacteremia.  Pharmacy was consulted on 11/29/18  for Ancef dosing.  Afebrile, Tm 98.2 , WBC 14.5>16>13.2k.  TDC  removed 2/21  POD#2 s/p LIJ tunneled catheter that is working well. HD done 2/24 and currently patient having HD this morning 2/26. 2/24  HD cath tip cx (removed)  + for MRSA and Candida. ID consulted and notes could be colonization , isolates are not on other cultures.  2/22 blood cultures are no growth to date x 4 days.  ID recommended to continue IV Ancef and if patientclinically changes ,becomes unstable, then will ID will change to vancomycin plus fluconazole.  ID noted  patient still having back pain.  Antibiotic course yet to be determined since need for TEE and MR imaging of spine to see if any signs of discitis.   Plan: Continue Ancef 2gm IV after each HD -MWF  Will f/u HD schedule and pt's clinical condition  Height: 5\' 7"  (170.2 cm) Weight: (!) 313 lb 7.9 oz (142.2 kg) IBW/kg (Calculated) : 66.1  Temp (24hrs), Avg:98.1 F (36.7 C), Min:97.5 F (36.4 C), Max:98.5 F (36.9 C)  Recent Labs  Lab 11/30/18 0251 12/01/18 0602 12/02/18 0326 12/03/18 0324 12/04/18 1156 12/05/18 0303  WBC 14.2* 12.0* 15.3* 14.5* 16.0* 13.2*  CREATININE 8.30* 7.50* 9.57* 11.43*  --  10.23*    Estimated Creatinine Clearance: 11.3 mL/min (A) (by C-G formula based on SCr of 10.23 mg/dL (H)).    No Known Allergies  Antimicrobials this admission: 2/19 Azith>>2/20 2/19 Rocephin>>2/20 2/20 Ancef>>  Microbiology results: 2/19 Resp PCR: negative 2/19 MRSA PCR: negative 2/19 BCx x2:  staph aureus, methicillin sensative   BCID: growing 2/4 +MSSA 2/22 BC x 2: NGTD x 4 days 2/21 HD cath tip MRSA, candida albicans  ?colonization per ID  Thank you for allowing pharmacy to be part of this patients care team.  Nicole Cella, [place] [phone] Please utilize Amion for appropriate phone number to reach the unit pharmacist ([place]) 12/05/2018 10:12 AM

## 2018-12-05 NOTE — Progress Notes (Signed)
Pt was very nervous and shaking about MRI. MD notified and new order obtained; prn medications given effective and pt more relaxed and sleeping. Pt transported off unit to MRI. Delia Heady RN

## 2018-12-05 NOTE — Care Management Note (Signed)
Case Management Note  Patient Details  Name: Seth [name] MRN: [id number] Date of Birth: Jan 24, 1966  Subjective/Objective:  From home, HD patient for MRI today, on 5 liters [place], per pt eval rec HHPT, NCM offered choice , he chose [facility], referral given to [employer] with [place].  Soc will begin 24-48 hrs post dc. will need HHPT order with face to face prior to dc.                    Action/Plan: NCM will follow for transition of care needs.   Expected Discharge Date:                  Expected Discharge Plan:  [place]  In-House Referral:     Discharge planning Services  CM Consult  Post Acute Care Choice:  Home Health Choice offered to:  Patient  DME Arranged:    DME Agency:     HH Arranged:  PT Outlook:  [place]  Status of Service:  Completed, signed off  If discussed at [place] of Stay Meetings, dates discussed:    Additional Comments:  Zenon Mayo, RN 12/05/2018, 2:34 PM

## 2018-12-05 NOTE — Progress Notes (Signed)
Physical Therapy Treatment Patient Details Name: Seth [name] MRN: [id number] DOB: [birth year] Today's Date: 12/05/2018    History of Present Illness 26yoM with HTN, hyperlipidemia, type 2 diabetes, ESRD on MWF HD, OSA on cpap, who presented to the hospital on 11/28/18 with hyperkalemia 7.1 after missing one HD session, MSSA bacteremia (likely due to infected R IJ HD catheter (removed by vascular surgery 11/30/18), acute hypoxic respiratory failure with respiratory acidosis and mixed anion gap metabolic acidosis. L internal jugular vein tunneled dialysis catherter placement on 12/03/18.      PT Comments    Patient cont to progress well with therapy now supervision for all mobility. Will plan to do stairs next session before discharge, agree with updated recs for HHPT as he has progressed past need for CIR.      Follow Up Recommendations  Home health PT     Equipment Recommendations  Rolling walker with 5" wheels    Recommendations for Other Services OT consult;Rehab consult     Precautions / Restrictions Precautions Precautions: Fall;Other (comment) Precaution Comments: monitor O2 sats with mobility.  Restrictions Weight Bearing Restrictions: No    Mobility  Bed Mobility               General bed mobility comments: patient sitting in bedside chair at entry  Transfers Overall transfer level: Needs assistance Equipment used: Rolling walker (2 wheeled) Transfers: Sit to/from Stand Sit to Stand: Supervision            Ambulation/Gait Ambulation/Gait assistance: Supervision Gait Distance (Feet): 200 Feet Assistive device: Rolling walker (2 wheeled) Gait Pattern/deviations: Step-through pattern Gait velocity: decreased   General Gait Details: progressing distance ambulating unit on RA, DOE 1/4 by end of visit. VSS.    Stairs             Wheelchair Mobility    Modified Rankin (Stroke Patients Only)       Balance Overall balance assessment: Needs  assistance Sitting-balance support: Feet supported;No upper extremity supported Sitting balance-Leahy Scale: Good     Standing balance support: Bilateral upper extremity supported;No upper extremity supported;Single extremity supported Standing balance-Leahy Scale: Fair                              Cognition Arousal/Alertness: Awake/alert Behavior During Therapy: WFL for tasks assessed/performed Overall Cognitive Status: Within Functional Limits for tasks assessed                                        Exercises      General Comments        Pertinent Vitals/Pain Pain Assessment: No/denies pain    Home Living                      Prior Function            PT Goals (current goals can now be found in the care plan section) Acute Rehab PT Goals Patient Stated Goal: get stronger return home PT Goal Formulation: With patient Time For Goal Achievement: 12/15/18 Potential to Achieve Goals: Good Progress towards PT goals: Progressing toward goals    Frequency    Min 3X/week      PT Plan Discharge plan needs to be updated    Co-evaluation              AM-PAC  PT "6 Clicks" Mobility   Outcome Measure  Help needed turning from your back to your side while in a flat bed without using bedrails?: None Help needed moving from lying on your back to sitting on the side of a flat bed without using bedrails?: A Little Help needed moving to and from a bed to a chair (including a wheelchair)?: None Help needed standing up from a chair using your arms (e.g., wheelchair or bedside chair)?: None Help needed to walk in hospital room?: A Little Help needed climbing 3-5 steps with a railing? : A Little 6 Click Score: 21    End of Session Equipment Utilized During Treatment: Gait belt Activity Tolerance: Patient limited by fatigue Patient left: in chair;with call bell/phone within reach Nurse Communication: Mobility status PT Visit  Diagnosis: Unsteadiness on feet (R26.81)     Time: 8325-4982 PT Time Calculation (min) (ACUTE ONLY): 14 min  Charges:  $Gait Training: 8-22 mins           Reinaldo Berber, PT, DPT Acute Rehabilitation Services Pager: [phone] Office: [phone]     Reinaldo Berber 12/05/2018, 3:55 PM

## 2018-12-05 NOTE — Progress Notes (Signed)
Pt back to unit from MRI. Pt sleeping; call light within reach and bed alarm on. Will closely monitor. Delia Heady RN

## 2018-12-05 NOTE — Progress Notes (Signed)
Subjective: Doing well. Tried to get MRI yesterday but the IV ativan wore off by the time they were ready to put him on the table. He also complained that the table was much smaller than he was expecting and he wasn't able to get enough cushions around him to make him comfortable. I spoke with the radiology tech who said there are other techniques they can try to make him more comfortable. We will give po ativan today which should last longer than the IV formulation. Seth [name] is agreeable to giving the MRI another try this afternoon after HD. Also discussed need for TEE prior to discharge. He understands the process and is "taking it one day at a time."  Objective:  Vital signs in last 24 hours: Vitals:   12/04/18 2313 12/05/18 0755 12/05/18 0805 12/05/18 0900  BP: 117/72 (!) 148/81 133/64 (!) 108/55  Pulse: 74 72 72 75  Resp: (!) 21     Temp: 98.5 F (36.9 C) 98.2 F (36.8 C)    TempSrc:  Oral    SpO2: 95% 95%    Weight:  (!) 142.2 kg    Height:       Gen: obese male sitting on edge of bed, NAD, alert and talkative Cardiac: RRR Pulm: breathing comfortably on RA MSK: left tunneled HC catheter in place, dressing is clean and dry, not TTP. Ext: no LEE   Antibiotics: Azithromycin 2/19-2/19 Ceftriaxone 2/19-2/19 Cefazolin 2/20 - present   Micro:  2/19 BC x2: Staph aureus, pan sensitive   2/21 Cath Tip culture: MRSA, candida albicans   2/22 repeat BC: NG at 4d   Assessment/Plan:  Principal Problem:   Bacteremia due to methicillin susceptible Staphylococcus aureus (MSSA) Active Problems:   Hyperlipidemia associated with type 2 diabetes mellitus ([place])   Morbid obesity ([place])   Hypertension associated with diabetes ([place])   End stage renal disease on dialysis ([place])   OSA (obstructive sleep apnea)   Controlled type 2 diabetes mellitus with chronic kidney disease on chronic dialysis ([place])   Hyperkalemia   NSTEMI (non-ST elevated myocardial infarction) ([place])   Acute  respiratory failure with hypoxia and hypercapnia ([name])   Hyponatremia  [name] with HTN, hyperlipidemia, type 2 diabetes, ESRD on MWF HD, OSA on cpap, who presented with hyperkalemia 7.1 after missing one HD session, MSSA bacteremia, acute hypoxic respiratory failure with respiratory acidosis and mixed anion gap metabolic acidosis.   ESRD on HD: Left tunneled IJ catheter in place. Vascular surgery planning for most likely AVG at a later date, after this current infection is completely treated. - HD per nephro, at EDW  MSSA Bacteremia: Stable. Remains afebrile with good vital signs. Feels well. Repeat blood cultures after infected RIJ catheter was removed are now no growth at 4 days. Needs imaging of spine and heart. Will try MRI again today - appreciate ID and nephrology's assistance  - continue cefazolin - f/u repeat blood cultures: no growth at 4 days - MR thoracic and lumbar spine without contrast: 1mg  po ativan prior to imaging  - cardiology planning for TEE, date to be determined   Type 2 DM: CBGs well controlled  - lantus 22U qhs - novolog 6U tid wc - continue SSI moderate  OSA, OHS: maintaining oxygen saturations > 90% on RA during my evaluation. Does not wear home oxygen  - cpap qhs  - [place] during the day, wean as toleratead    HTN: holding home amlodipine HLD: continue home statin Chronic pain: continue home  oxycodone-acetaminophen with hold parameters   Dispo: Anticipated discharge in 2-3 days  Seth Course, MD 12/05/2018, 10:43 AM Pager: [phone]

## 2018-12-05 NOTE — Procedures (Signed)
I was present at this dialysis session. I have reviewed the session itself and made appropriate changes.   Vital signs in last 24 hours:  Temp:  [97.5 F (36.4 C)-98.5 F (36.9 C)] 98.2 F (36.8 C) (02/26 0755) Pulse Rate:  [72-74] 72 (02/26 0805) Resp:  [19-21] 21 (02/25 2313) BP: (117-152)/(64-87) 133/64 (02/26 0805) SpO2:  [95 %] 95 % (02/26 0755) Weight:  [142.2 kg] 142.2 kg (02/26 0755) Weight change:  Filed Weights   12/03/18 1240 12/03/18 1650 12/05/18 0755  Weight: (!) 145.3 kg (!) 141.7 kg (!) 142.2 kg    Recent Labs  Lab 12/05/18 0303  NA 131*  K 4.7  CL 93*  CO2 20*  GLUCOSE 209*  BUN 107*  CREATININE 10.23*  CALCIUM 8.6*  PHOS 5.9*    Recent Labs  Lab 12/03/18 0324 12/04/18 1156 12/05/18 0303  WBC 14.5* 16.0* 13.2*  HGB 11.9* 10.5* 10.9*  HCT 37.8* 34.4* 35.2*  MCV 96.4 96.1 95.9  PLT 247 281 299    Scheduled Meds: . aspirin EC  81 mg Oral Daily  . atorvastatin  40 mg Oral q1800  . chlorhexidine  15 mL Mouth Rinse BID  . Chlorhexidine Gluconate Cloth  6 each Topical Q0600  . Chlorhexidine Gluconate Cloth  6 each Topical Q0600  . ferric citrate  630 mg Oral TID WC  . heparin  5,000 Units Subcutaneous Q8H  . insulin aspart  0-15 Units Subcutaneous TID WC  . insulin aspart  0-5 Units Subcutaneous QHS  . insulin aspart  6 Units Subcutaneous TID WC  . insulin glargine  22 Units Subcutaneous QHS  . mouth rinse  15 mL Mouth Rinse q12n4p  . mupirocin ointment  1 application Nasal BID   Continuous Infusions: . sodium chloride    . sodium chloride    . albuterol Stopped (11/28/18 1100)  .  ceFAZolin (ANCEF) IV Stopped (12/03/18 1646)   PRN Meds:.sodium chloride, sodium chloride, alteplase, ferric citrate, heparin, lidocaine (PF), lidocaine, lidocaine-prilocaine, LORazepam, oxyCODONE-acetaminophen **AND** oxyCODONE, pentafluoroprop-tetrafluoroeth, polyethylene glycol, senna-docusate    Dialysis:[facility] MWF [address] 4h 138.5kg  Heparin 4500 + 1000/hr L IJ TDC  Assessment/ Plan:   1. MSSA bacteremia- had catheter holiday over weekend and new RIJ tdc placed yesterday prior to HD.  TEE pending to r/o SBE as well as MRI of thoracic spine. Continue ancef per ID 2. ESRD continue with HD q MWF 3. Anemia: stable Hgb 4. CHF/volume overload- cont to UF with HD to edw  5. CKD-MBD: phos mildly elevated, will need to initiate binders (was taking [place] as an outpatient). 6. Nutrition: renal diet 7. Hypertension: stable 8. Vascular access- new LIJ TDC placed 12/03/18 and appreciate VVS to evaluate for possible BVT vs avg  Donetta Potts,  MD 12/05/2018, 8:41 AM

## 2018-12-06 LAB — CBC
HCT: 36.7 % — ABNORMAL LOW (ref 39.0–52.0)
Hemoglobin: 11.2 g/dL — ABNORMAL LOW (ref 13.0–17.0)
MCH: 29.6 pg (ref 26.0–34.0)
MCHC: 30.5 g/dL (ref 30.0–36.0)
MCV: 97.1 fL (ref 80.0–100.0)
Platelets: 320 10*3/uL (ref 150–400)
RBC: 3.78 MIL/uL — ABNORMAL LOW (ref 4.22–5.81)
RDW: 14.6 % (ref 11.5–15.5)
WBC: 12.6 10*3/uL — ABNORMAL HIGH (ref 4.0–10.5)
nRBC: 0.2 % (ref 0.0–0.2)

## 2018-12-06 LAB — CULTURE, BLOOD (ROUTINE X 2)
Culture: NO GROWTH
Culture: NO GROWTH
Special Requests: ADEQUATE
Special Requests: ADEQUATE

## 2018-12-06 LAB — GLUCOSE, CAPILLARY
Glucose-Capillary: 198 mg/dL — ABNORMAL HIGH (ref 70–99)
Glucose-Capillary: 211 mg/dL — ABNORMAL HIGH (ref 70–99)
Glucose-Capillary: 165 mg/dL — ABNORMAL HIGH (ref 70–99)
Glucose-Capillary: 286 mg/dL — ABNORMAL HIGH (ref 70–99)

## 2018-12-06 NOTE — Discharge Summary (Signed)
Name: MANDEL SEIDEN MRN: [id number] DOB: [birth year] 53 y.o. PCP: Carroll Sage, MD  Date of Admission: 11/28/2018  7:42 AM Date of Discharge: 12/08/2018 Attending Physician: Dr. Gilles Chiquito  Discharge Diagnosis: 1. MSSA Bacteremai 2. ESRD on HD 3. Type 2 DM 4. OSA, OHS 5. HTN 6. Chronic pain   Discharge Medications: Allergies as of 12/08/2018   No Known Allergies     Medication List    TAKE these medications   amLODipine 10 MG tablet Commonly known as:  NORVASC Take 1 tablet (10 mg total) by mouth daily.   aspirin EC 81 MG tablet Take 81 mg by mouth daily.   atorvastatin 40 MG tablet Commonly known as:  LIPITOR Take 1 tablet (40 mg total) by mouth daily at 6 PM.   AURYXIA 1 GM 210 MG(Fe) tablet Generic drug:  ferric citrate Take 630 mg by mouth See admin instructions. Take 630 mg 3 times daily with each meal and 420 mg with each snack   ceFAZolin  IVPB Commonly known as:  ANCEF Inject 2 g into the vein every Monday, Wednesday, and Friday with hemodialysis for 5 days. Indication:  Bacteremia Last Day of Therapy:  12/15/2018 Labs - Once weekly:  CBC/D and BMP, Labs - Every other week:  ESR and CRP Start taking on:  December 10, 2018   diclofenac sodium 1 % Gel Commonly known as:  VOLTAREN Apply 4 g topically 4 (four) times daily. What changed:    when to take this  reasons to take this   insulin aspart 100 UNIT/ML FlexPen Commonly known as:  NOVOLOG FLEXPEN Inject 0-20 Units into the skin 3 (three) times daily with meals. CBG < 70: drink orange juice and recheck glucose or use glucose tabs. What changed:    how much to take  how to take this  when to take this  additional instructions   oxyCODONE-acetaminophen 10-325 MG tablet Commonly known as:  PERCOCET Take 1 tablet by mouth 3 (three) times daily as needed for pain.   triamcinolone cream 0.1 % Commonly known as:  KENALOG Apply 1 application topically 2 (two) times daily. What changed:     when to take this  reasons to take this            Home Infusion Instuctions  (From admission, onward)         Start     Ordered   12/08/18 0000  Home infusion instructions [facility] May follow [place] Dosing Protocol; May administer Cathflo as needed to maintain patency of vascular access device.; Flushing of vascular access device: per [facility] Protocol: 0.9% NaCl pre/post medica...    Question Answer Comment  Instructions May follow [place] Dosing Protocol   Instructions May administer Cathflo as needed to maintain patency of vascular access device.   Instructions Flushing of vascular access device: per [facility] Protocol: 0.9% NaCl pre/post medication administration and prn patency; Heparin 100 u/ml, 53m for implanted ports and Heparin 10u/ml, 543mfor all other central venous catheters.   Instructions May follow [facility] Anaphylaxis Protocol for First Dose Administration in the home: 0.9% NaCl at 25-50 ml/hr to maintain IV access for protocol meds. Epinephrine 0.3 ml IV/IM PRN and Benadryl 25-50 IV/IM PRN s/s of anaphylaxis.   Instructions [facility] Infusion Coordinator (RN) to assist per patient IV care needs in the home PRN.      12/08/18 1102           Durable Medical  Equipment  (From admission, onward)         Start     Ordered   12/07/18 1446  For home use only DME Tub bench  Once     12/07/18 1445   12/07/18 1445  For home use only DME 3 n 1  Once     12/07/18 1445   12/07/18 1445  For home use only DME Walker rolling  Once    Comments:  5 inch wheels  Question:  Patient needs a walker to treat with the following condition  Answer:  Obesity   12/07/18 1445          Disposition and follow-up:   Mr.Kanoa B Youngblood was discharged from [facility] in Stable condition.  At the hospital follow up visit please address:  MSSA Bactermia: ensure completion of IV antibiotics, dosed with HD  ESRD on HD: needs vascular surgery f/u  for permanent access  Type 2 DM: Please determine need for long acting insulin on follow-up as A1c is unreliable, f/u compliance and adjust regiment if needed  OSA on CPAP: Patient made assistance with obtaining new CPAP device but continues to use his currently.  2.  Labs / imaging needed at time of follow-up: CBC to ensure leukocytosis resolves   3.  Pending labs/ test needing follow-up: none   Follow-up Appointments: Follow-up Information    [place] [place] Follow up.   Specialty:  Home Health Services Why:  HHPT Contact information: [place] [address] [place] by problem list: 1. MSSA Bacteremia: 2/2 infected right IJ temporary HD catheter. Blood cultures grew MSSA and he was treated with cefepime. RIJ catheter was removed and catheter tip cultures grew MRSA and candida. However, given patient's clinical improvement on cefepime, antibiotics were not changed. Repeat blood cultures were no growth at 4 days. Given low back pain, there was concern for osteomyelitis of the spine. However, MRI lumbar spine did not show any evidence of infection. TTE was difficult to assess valves given body habitus and TEE failed to demonstrate vegetation. Leukocytosis improved. He was afebrile and well appearing. He was discharged on IV cefazolin to complete an treatment course on March 7.   2. ESRD on HD: presented with hyperkalemia to 7.1 after missing one HD session. Taking urgently to HD and potassium remained WNLs during the rest of the hospitalization. Right IJ catheter was removed, as above, and he was discharged with left IJ tunneled catheter. This was accessed via inpatient HD without issues. Vascular surgery was consulted during admission and plans to place AVG after his current infection is completely treated.   3. DM 2: only on short acting insulin at home. CBGs were elevated during admission. Started on long acting  insulin and discharged with sliding scale insulin.  4. OSA, OHS: has cpap at home but noncompliant. Trialed cpap during admission and loved it. Will need f/u to ensure home cpap settings are adjusted appropriately.  Will need assistance with new CPAP device.  5. HTN: home amlodipine was held during admission and resumed on discharge 6. HLD: continued home statin  7. Chronic Pain: continued home percocet   Discharge Vitals:   BP (!) 121/55 (BP Location: Left Leg)   Pulse 79   Temp (!) 97.4 F (36.3 C) (Oral)   Resp 18   Ht '5\' 7"'$  (1.702 m)   Wt (!) 140.3 kg  SpO2 95%   BMI 48.44 kg/m   Pertinent Labs, Studies, and Procedures:   MR Lumbar spine: 1. Limited 3 sequence MRI of the lumbar spine without image findings of infection. 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 2. Grade 2 L5-S1 anterolisthesis.  No spondylolysis. 3. Moderate canal stenosis L5-S1 and severe L5-S1 neural foraminal Narrowing.  Results for orders placed or performed during the hospital encounter of 11/28/18  Culture, blood (Routine X 2) w Reflex to ID Panel     Status: Abnormal   Collection Time: 11/28/18  8:08 AM  Result Value Ref Range Status   Specimen Description BLOOD RIGHT ANTECUBITAL  Final   Special Requests   Final    BOTTLES DRAWN AEROBIC AND ANAEROBIC Blood Culture adequate volume   Culture  Setup Time   Final    GRAM POSITIVE COCCI IN BOTH AEROBIC AND ANAEROBIC BOTTLES CRITICAL RESULT CALLED TO, READ BACK BY AND VERIFIED WITH: C AMEND PHARMD 11/29/18 0010 JDW Performed at [facility], [address], [place], [place] [address]    Culture STAPHYLOCOCCUS AUREUS (A)  Final   Report Status 12/01/2018 FINAL  Final   Organism ID, Bacteria STAPHYLOCOCCUS AUREUS  Final      Susceptibility   Staphylococcus aureus - MIC*    CIPROFLOXACIN <=0.5 SENSITIVE Sensitive     ERYTHROMYCIN <=0.25 SENSITIVE Sensitive     GENTAMICIN <=0.5 SENSITIVE Sensitive      OXACILLIN 0.5 SENSITIVE Sensitive     TETRACYCLINE <=1 SENSITIVE Sensitive     VANCOMYCIN <=0.5 SENSITIVE Sensitive     TRIMETH/SULFA <=10 SENSITIVE Sensitive     CLINDAMYCIN <=0.25 SENSITIVE Sensitive     RIFAMPIN <=0.5 SENSITIVE Sensitive     Inducible Clindamycin NEGATIVE Sensitive     * STAPHYLOCOCCUS AUREUS  Blood Culture ID Panel (Reflexed)     Status: Abnormal   Collection Time: 11/28/18  8:08 AM  Result Value Ref Range Status   Enterococcus species NOT DETECTED NOT DETECTED Final   Listeria monocytogenes NOT DETECTED NOT DETECTED Final   Staphylococcus species DETECTED (A) NOT DETECTED Final    Comment: CRITICAL RESULT CALLED TO, READ BACK BY AND VERIFIED WITH: C AMEND PHARMD 11/29/18 0010 JDW    Staphylococcus aureus (BCID) DETECTED (A) NOT DETECTED Final    Comment: Methicillin (oxacillin) susceptible Staphylococcus aureus (MSSA). Preferred therapy is anti staphylococcal beta lactam antibiotic (Cefazolin or Nafcillin), unless clinically contraindicated. CRITICAL RESULT CALLED TO, READ BACK BY AND VERIFIED WITH: C AMEND PHARMD 11/29/18 0010 JDW    Methicillin resistance NOT DETECTED NOT DETECTED Final   Streptococcus species NOT DETECTED NOT DETECTED Final   Streptococcus agalactiae NOT DETECTED NOT DETECTED Final   Streptococcus pneumoniae NOT DETECTED NOT DETECTED Final   Streptococcus pyogenes NOT DETECTED NOT DETECTED Final   Acinetobacter baumannii NOT DETECTED NOT DETECTED Final   Enterobacteriaceae species NOT DETECTED NOT DETECTED Final   Enterobacter cloacae complex NOT DETECTED NOT DETECTED Final   Escherichia coli NOT DETECTED NOT DETECTED Final   Klebsiella oxytoca NOT DETECTED NOT DETECTED Final   Klebsiella pneumoniae NOT DETECTED NOT DETECTED Final   Proteus species NOT DETECTED NOT DETECTED Final   Serratia marcescens NOT DETECTED NOT DETECTED Final   Haemophilus influenzae NOT DETECTED NOT DETECTED Final   Neisseria meningitidis NOT DETECTED NOT DETECTED  Final   Pseudomonas aeruginosa NOT DETECTED NOT DETECTED Final   Candida albicans NOT DETECTED NOT DETECTED Final   Candida glabrata NOT DETECTED NOT DETECTED Final  Candida krusei NOT DETECTED NOT DETECTED Final   Candida parapsilosis NOT DETECTED NOT DETECTED Final   Candida tropicalis NOT DETECTED NOT DETECTED Final    Comment: Performed at [facility], [place] [address] [place], [place] [address]  Culture, blood (Routine X 2) w Reflex to ID Panel     Status: Abnormal   Collection Time: 11/28/18 11:13 AM  Result Value Ref Range Status   Specimen Description BLOOD RIGHT HAND  Final   Special Requests   Final    BOTTLES DRAWN AEROBIC AND ANAEROBIC Blood Culture adequate volume   Culture  Setup Time   Final    GRAM POSITIVE COCCI IN BOTH AEROBIC AND ANAEROBIC BOTTLES CRITICAL VALUE NOTED.  VALUE IS CONSISTENT WITH PREVIOUSLY REPORTED AND CALLED VALUE. Performed at [facility], [place] [address] [place], [place] [address]    Culture (A)  Final    STAPHYLOCOCCUS AUREUS SUSCEPTIBILITIES PERFORMED ON PREVIOUS CULTURE WITHIN THE LAST 5 DAYS.    Report Status 12/01/2018 FINAL  Final  Respiratory Panel by PCR     Status: None   Collection Time: 11/28/18  8:29 PM  Result Value Ref Range Status   Adenovirus NOT DETECTED NOT DETECTED Final   Coronavirus 229E NOT DETECTED NOT DETECTED Final    Comment: (NOTE) The Coronavirus on the Respiratory Panel, DOES NOT test for the novel  Coronavirus (2019 nCoV)    Coronavirus HKU1 NOT DETECTED NOT DETECTED Final   Coronavirus NL63 NOT DETECTED NOT DETECTED Final   Coronavirus OC43 NOT DETECTED NOT DETECTED Final   Metapneumovirus NOT DETECTED NOT DETECTED Final   Rhinovirus / Enterovirus NOT DETECTED NOT DETECTED Final   Influenza A NOT DETECTED NOT DETECTED Final   Influenza B NOT DETECTED NOT DETECTED Final   Parainfluenza Virus 1 NOT DETECTED NOT DETECTED Final   Parainfluenza Virus 2 NOT DETECTED NOT DETECTED Final    Parainfluenza Virus 3 NOT DETECTED NOT DETECTED Final   Parainfluenza Virus 4 NOT DETECTED NOT DETECTED Final   Respiratory Syncytial Virus NOT DETECTED NOT DETECTED Final   Bordetella pertussis NOT DETECTED NOT DETECTED Final   Chlamydophila pneumoniae NOT DETECTED NOT DETECTED Final   Mycoplasma pneumoniae NOT DETECTED NOT DETECTED Final    Comment: Performed at [facility], [address] [place], [place] [address]  MRSA PCR Screening     Status: None   Collection Time: 11/28/18  8:29 PM  Result Value Ref Range Status   MRSA by PCR NEGATIVE NEGATIVE Final    Comment:        The GeneXpert MRSA Assay (FDA approved for NASAL specimens only), is one component of a comprehensive MRSA colonization surveillance program. It is not intended to diagnose MRSA infection nor to guide or monitor treatment for MRSA infections. Performed at [facility], [place] [address], [place], [place] [address]   Cath Tip Culture     Status: Abnormal   Collection Time: 11/30/18  1:55 PM  Result Value Ref Range Status   Specimen Description CATH TIP  Final   Special Requests   Final    NONE Performed at [facility], [place] [address], [place],  [address]    Culture (A)  Final    METHICILLIN RESISTANT STAPHYLOCOCCUS AUREUS CANDIDA ALBICANS    Report Status 12/03/2018 FINAL  Final   Organism ID, Bacteria METHICILLIN RESISTANT STAPHYLOCOCCUS AUREUS  Final      Susceptibility   Methicillin resistant staphylococcus aureus - MIC*  CIPROFLOXACIN 1 SENSITIVE Sensitive     ERYTHROMYCIN <=0.25 SENSITIVE Sensitive     GENTAMICIN <=0.5 SENSITIVE Sensitive     OXACILLIN >=4 RESISTANT Resistant     TETRACYCLINE <=1 SENSITIVE Sensitive     VANCOMYCIN 2 SENSITIVE Sensitive     TRIMETH/SULFA <=10 SENSITIVE Sensitive     CLINDAMYCIN <=0.25 SENSITIVE Sensitive     RIFAMPIN <=0.5 SENSITIVE Sensitive     Inducible Clindamycin NEGATIVE Sensitive     * METHICILLIN RESISTANT STAPHYLOCOCCUS  AUREUS  Culture, blood (routine x 2)     Status: None   Collection Time: 12/01/18  6:00 AM  Result Value Ref Range Status   Specimen Description BLOOD RIGHT HAND  Final   Special Requests   Final    BOTTLES DRAWN AEROBIC ONLY Blood Culture adequate volume   Culture   Final    NO GROWTH 5 DAYS Performed at [facility], [place] [address] [place], [place] [address]    Report Status 12/06/2018 FINAL  Final  Culture, blood (routine x 2)     Status: None   Collection Time: 12/01/18  6:08 AM  Result Value Ref Range Status   Specimen Description BLOOD RIGHT HAND  Final   Special Requests   Final    BOTTLES DRAWN AEROBIC ONLY Blood Culture adequate volume   Culture   Final    NO GROWTH 5 DAYS Performed at [facility], [place] [address], [place], [place] [address]    Report Status 12/06/2018 FINAL  Final     Discharge Instructions: Discharge Instructions    Diet - low sodium heart healthy   Complete by:  As directed    Home infusion instructions [facility] May follow [place] Dosing Protocol; May administer Cathflo as needed to maintain patency of vascular access device.; Flushing of vascular access device: per [facility] Protocol: 0.9% NaCl pre/post medica...   Complete by:  As directed    Instructions:  May follow [place] Dosing Protocol   Instructions:  May administer Cathflo as needed to maintain patency of vascular access device.   Instructions:  Flushing of vascular access device: per [facility] Protocol: 0.9% NaCl pre/post medication administration and prn patency; Heparin 100 u/ml, 59m for implanted ports and Heparin 10u/ml, 57mfor all other central venous catheters.   Instructions:  May follow [facility] Anaphylaxis Protocol for First Dose Administration in the home: 0.9% NaCl at 25-50 ml/hr to maintain IV access for protocol meds. Epinephrine 0.3 ml IV/IM PRN and Benadryl 25-50 IV/IM PRN s/s of anaphylaxis.   Instructions:  [facility] Coord[facility] (RN) to  assist per patient IV care needs in the home PRN.   Increase activity slowly   Complete by:  As directed       Signed: HaKathi LudwigMD 12/08/2018, 11:07 AM   Pager: [phone]

## 2018-12-06 NOTE — Progress Notes (Signed)
   [place] has been requested to perform a transesophageal echocardiogram on Seth [name] for bacteremia.  After careful review of history and examination, the risks and benefits of transesophageal echocardiogram have been explained including risks of esophageal damage, perforation (1:10,000 risk), bleeding, pharyngeal hematoma as well as other potential complications associated with conscious sedation including aspiration, arrhythmia, respiratory failure and death. Alternatives to treatment were discussed, questions were answered. Patient is willing to proceed.   Procedure is scheduled for Friday 12/07/2018 at 13:45 with Dr. Acie Fredrickson.  Darreld Mclean, PA-C 12/06/2018 3:22 PM

## 2018-12-06 NOTE — Progress Notes (Signed)
Patient places self on CPAP.  RT assistance not needed at this time. 

## 2018-12-06 NOTE — Progress Notes (Signed)
  Date: 12/06/2018  Patient name: Seth [name]  Medical record number: [id number]  Date of birth: 06-03-1966   I have seen and evaluated this patient and I have discussed the plan of care with the house staff. Please see their note for complete details. I concur with their findings with the following additions/corrections:   Able to get MRI of his lumbar spine last night, but unable to complete T-spine as well.  Lumbar spine shows no evidence of osteomyelitis or discitis.  We are still awaiting TEE for evaluation for endocarditis, but after that is done, I think he will be ready for discharge on a prolonged course of IV cefazolin to be given at dialysis.  Lenice Pressman, M.D., Ph.D. 12/06/2018, 11:40 AM

## 2018-12-06 NOTE — Progress Notes (Signed)
[name], male   DOB: 1966/06/23, 53 y.o.   MRN: [id number] S: Feels well, no complaints O:BP 136/73 (BP Location: Left Leg)   Pulse 81   Temp 97.7 F (36.5 C) (Oral)   Resp 19   Ht 5\' 7"  (1.702 m)   Wt (!) 139.2 kg   SpO2 95%   BMI 48.06 kg/m   Intake/Output Summary (Last 24 hours) at 12/06/2018 1143 Last data filed at 12/06/2018 0000 Gross per 24 hour  Intake 100 ml  Output 3700 ml  Net -3600 ml   Intake/Output: I/O last 3 completed shifts: In: 320 [P.O.:320] Out: 3700 [Other:3700]  Intake/Output this shift:  No intake/output data recorded. Weight change:  Gen: nad CVS: no rub Resp:cta Abd: benign Ext: no edema  Recent Labs  Lab 11/29/18 1301 11/30/18 0251 12/01/18 0602 12/02/18 0326 12/03/18 0324 12/05/18 0303  NA 129* 131* 128* 128* 129* 131*  K 5.4* 4.9 4.6 4.5 4.8 4.7  CL 88* 91* 87* 87* 89* 93*  CO2 24 24 24  21* 24 20*  GLUCOSE 197* 183* 306* 262* 221* 209*  BUN 64* 43* 54* 83* 107* 107*  CREATININE 11.43* 8.30* 7.50* 9.57* 11.43* 10.23*  ALBUMIN 2.8*  --   --   --  3.0* 3.0*  CALCIUM 7.4* 7.9* 7.9* 8.2* 8.3* 8.6*  PHOS 5.8*  --  5.2*  --  6.3* 5.9*   Liver Function Tests: Recent Labs  Lab 11/29/18 1301 12/03/18 0324 12/05/18 0303  ALBUMIN 2.8* 3.0* 3.0*   No results for input(s): LIPASE, AMYLASE in the last 168 hours. No results for input(s): AMMONIA in the last 168 hours. CBC: Recent Labs  Lab 12/02/18 0326 12/03/18 0324 12/04/18 1156 12/05/18 0303 12/06/18 0627  WBC 15.3* 14.5* 16.0* 13.2* 12.6*  HGB 11.1* 11.9* 10.5* 10.9* 11.2*  HCT 35.8* 37.8* 34.4* 35.2* 36.7*  MCV 96.2 96.4 96.1 95.9 97.1  PLT 206 247 281 299 320   Cardiac Enzymes: Recent Labs  Lab 11/29/18 1301  TROPONINI 0.74*   CBG: Recent Labs  Lab 12/05/18 1309 12/05/18 1629 12/05/18 2108 12/06/18 0714 12/06/18 1124  GLUCAP 245* 249* 182* 198* 211*    Iron Studies: No results for input(s): IRON, TIBC, TRANSFERRIN, FERRITIN in the last 72  hours. Studies/Results: Mr Lumbar Spine Wo Contrast  Result Date: 12/05/2018 CLINICAL DATA:  Back pain, suspect infection. History of diabetes, end-stage renal disease on dialysis, MSSA bacteremia. EXAM: MRI LUMBAR SPINE WITHOUT CONTRAST TECHNIQUE: Sagittal STIR, sagittal T2 and axial T2 MR sequences of the lumbar spine was performed. No intravenous contrast was administered. COMPARISON:  CT abdomen and pelvis November 06, 2017 FINDINGS: SEGMENTATION: For the purposes of this report, transitional anatomy with lumbarized S1 vertebral body. ALIGNMENT: Maintained lumbar lordosis. Similar grade 2 L5-S1 anterolisthesis, no definite spondylolysis. VERTEBRAE:Vertebral bodies are intact. Similar moderate to severe L5-S1 disc height loss with moderate chronic discogenic endplate changes, minimal acute component. Mild disc desiccation. No suspicious bone marrow signal though limited assessment by lack of T1. Generally decreased bone marrow signal corresponding to sclerosis on prior CT. T11 suspected hemangioma. CONUS MEDULLARIS AND CAUDA EQUINA: Conus medullaris terminates at L1-2 and demonstrates normal morphology and signal characteristics. Cauda equina is normal. PARASPINAL AND OTHER SOFT TISSUES: Limited assessment. Moderate paraspinal muscle atrophy. Atrophic kidneys. DISC LEVELS: Moderate canal stenosis L5-S1 due to anterolisthesis. Limited assessment of neural foraminal narrowing due to lack of T1 sequences. Moderate suspected RIGHT L3-4 and severe bilateral L5-S1 neural foraminal narrowing. IMPRESSION: 1. Limited 3  sequence MRI of the lumbar spine without image findings of infection. 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 2. Grade 2 L5-S1 anterolisthesis.  No spondylolysis. 3. Moderate canal stenosis L5-S1 and severe L5-S1 neural foraminal narrowing. Electronically Signed   By: Elon Alas M.D.   On: 12/05/2018 22:56   Vas [place] Upper Ext Vein Mapping (pre-op  Avf)  Result Date: 12/04/2018 UPPER EXTREMITY VEIN MAPPING  Indications: Pre-access. Performing Technologist: Toma Copier RVS  Examination Guidelines: A complete evaluation includes B-mode imaging, spectral Doppler, color Doppler, and power Doppler as needed of all accessible portions of each vessel. Bilateral testing is considered an integral part of a complete examination. Limited examinations for reoccurring indications may be performed as noted. +-----------------+-------------+----------+----------+ Left Cephalic    Diameter (cm)Depth (cm) Findings  +-----------------+-------------+----------+----------+ Shoulder             0.23        0.91              +-----------------+-------------+----------+----------+ Prox upper arm       0.24        0.47   branching  +-----------------+-------------+----------+----------+ Mid upper arm        0.09        0.32              +-----------------+-------------+----------+----------+ Dist upper arm       0.14        0.63              +-----------------+-------------+----------+----------+ Antecubital fossa    0.27        0.39              +-----------------+-------------+----------+----------+ Prox forearm         0.28        0.95              +-----------------+-------------+----------+----------+ Mid forearm          0.16        0.63              +-----------------+-------------+----------+----------+ Wrist                0.08        0.39   Thrombosed +-----------------+-------------+----------+----------+ +-----------------+-------------+----------+---------------+ Left Basilic     Diameter (cm)Depth (cm)   Findings     +-----------------+-------------+----------+---------------+ Mid upper arm        0.49        2.38   enters brachial +-----------------+-------------+----------+---------------+ Dist upper arm       0.47        1.96                    +-----------------+-------------+----------+---------------+ Antecubital fossa    0.27        0.65      branching    +-----------------+-------------+----------+---------------+ Prox forearm         0.30        0.47      branching    +-----------------+-------------+----------+---------------+ Mid forearm          0.24        0.24                   +-----------------+-------------+----------+---------------+ Wrist                0.12        0.26                   +-----------------+-------------+----------+---------------+ *  See table(s) above for measurements and observations.  Diagnosing physician: Harold Barban MD Electronically signed by Harold Barban MD on 12/04/2018 at 7:08:40 PM.    Final    . aspirin EC  81 mg Oral Daily  . atorvastatin  40 mg Oral q1800  . chlorhexidine  15 mL Mouth Rinse BID  . Chlorhexidine Gluconate Cloth  6 each Topical Q0600  . Chlorhexidine Gluconate Cloth  6 each Topical Q0600  . ferric citrate  630 mg Oral TID WC  . heparin  5,000 Units Subcutaneous Q8H  . insulin aspart  0-15 Units Subcutaneous TID WC  . insulin aspart  0-5 Units Subcutaneous QHS  . insulin aspart  6 Units Subcutaneous TID WC  . insulin glargine  22 Units Subcutaneous QHS  . mouth rinse  15 mL Mouth Rinse q12n4p  . mupirocin ointment  1 application Nasal BID    BMET    Component Value Date/Time   NA 131 (L) 12/05/2018 0303   K 4.7 12/05/2018 0303   CL 93 (L) 12/05/2018 0303   CO2 20 (L) 12/05/2018 0303   GLUCOSE 209 (H) 12/05/2018 0303   BUN 107 (H) 12/05/2018 0303   CREATININE 10.23 (H) 12/05/2018 0303   CALCIUM 8.6 (L) 12/05/2018 0303   CALCIUM 7.7 (L) 08/12/2015 0738   GFRNONAA 5 (L) 12/05/2018 0303   GFRAA 6 (L) 12/05/2018 0303   CBC    Component Value Date/Time   WBC 12.6 (H) 12/06/2018 0627   RBC 3.78 (L) 12/06/2018 0627   HGB 11.2 (L) 12/06/2018 0627   HCT 36.7 (L) 12/06/2018 0627   PLT 320 12/06/2018 0627   MCV 97.1 12/06/2018 0627   MCH 29.6  12/06/2018 0627   MCHC 30.5 12/06/2018 0627   RDW 14.6 12/06/2018 0627   LYMPHSABS 0.9 11/28/2018 0808   MONOABS 1.3 (H) 11/28/2018 0808   EOSABS 0.0 11/28/2018 0808   BASOSABS 0.1 11/28/2018 0808    Dialysis:[facility] MWF [address] 4h 138.5kg Heparin 4500 + 1000/hr LIJ TDC  Assessment/ Plan:  1. MSSA bacteremia- had catheter holiday over weekend and new RIJ tdc placed yesterday prior to HD.  1. MRI of spine negative for osteo or diskitis 2. TEE still pending to r/o SBE 3. Continue with ancef per ID and can be administered at outpatient HD 2. ESRDcontinue with HD q MWF and plan for HD tomorrow if he remains an inpatient. 3. Anemia:stable Hgb 4. CHF/volume overload- cont to UF with HD to edw 5. CKD-MBD:phos mildly elevated, will need to initiate binders (was taking [place] as an outpatient). 6. Nutrition:renal diet 7. Hypertension:stable 8. Vascular access- new LIJ TDC placed 12/03/18 and appreciate VVS to evaluate for possible BVT vs avg 9. Disposition - discharge pending TEE.  Donetta Potts, MD [employer] [phone]

## 2018-12-06 NOTE — Progress Notes (Signed)
   Subjective: No new complaints. Was able to get lumbar spine MRI done last night. Thanked him for his efforts. Discussed plan for TEE and then discharge home. Will reach out to cardiology about scheduling the TEE. Also, asked Mr. Reinitz to ask his respiratory therapist about his home cpap settings   Objective:  Vital signs in last 24 hours: Vitals:   12/05/18 2232 12/05/18 2235 12/05/18 2312 12/06/18 0714  BP: (!) 91/51 (!) 97/52 114/68 136/73  Pulse: 84 84 87 81  Resp: 20 20 20 19   Temp: 98.1 F (36.7 C)  98.3 F (36.8 C) 97.7 F (36.5 C)  TempSrc: Oral   Oral  SpO2: 98% 98% 95% 95%  Weight:      Height:       Gen: obese male sitting on edge of bed, NAD, alert and talkative Cardiac: RRR Pulm: breathing comfortably on RA, CTAB MSK: left tunneled HC catheter in place, dressing is clean and dry, not TTP.  Antibiotics: Azithromycin 2/19-2/19 Ceftriaxone 2/19-2/19 Cefazolin 2/20 - present   Micro:  2/19 BC x2: Staph aureus, pan sensitive   2/21 Cath Tip culture: MRSA, candida albicans   2/22 repeat BC: NG at 4d   Assessment/Plan:  Principal Problem:   Bacteremia due to methicillin susceptible Staphylococcus aureus (MSSA) Active Problems:   Hyperlipidemia associated with type 2 diabetes mellitus ([place])   Morbid obesity ([place])   Hypertension associated with diabetes ([place])   End stage renal disease on dialysis ([place])   OSA (obstructive sleep apnea)   Controlled type 2 diabetes mellitus with chronic kidney disease on chronic dialysis ([place])   Hyperkalemia   NSTEMI (non-ST elevated myocardial infarction) ([place])   Acute respiratory failure with hypoxia and hypercapnia ([name])   Hyponatremia  [name] with HTN, hyperlipidemia, type 2 diabetes, ESRD on MWF HD, OSA on cpap, who presented with hyperkalemia 7.1 after missing one HD session, MSSA bacteremia, acute hypoxic respiratory failure with respiratory acidosis and mixed anion gap metabolic acidosis.   ESRD on HD: Left tunneled  IJ catheter in place. Vascular surgery planning for most likely AVG at a later date, after this current infection is completely treated. - HD per nephro, at EDW  MSSA Bacteremia: Stable. Remains afebrile with good vital signs. Leukocytosis improving. Feels well. Repeat blood cultures after infected RIJ catheter was removed are now no growth at 4 days. MRI lumbar spine without evidence of osteomyelitis. Unable to get T spine due to hardware from previous surgery and patient's anxiety made it difficult to continue the MRI.  - appreciate ID and nephrology's assistance  - continue cefazolin, ID plans to treat as complicated bacteremia. Course will be determined pending TEE results.  - f/u repeat blood cultures: no growth at 4 days - cardiology planning for TEE, date to be determined   Type 2 DM: CBGs well controlled  - lantus 22U qhs - novolog 6U tid wc - continue SSI moderate  OSA, OHS: maintaining oxygen saturations > 90% on RA during my evaluation. Does not wear home oxygen  - cpap qhs  - [place] during the day, wean as toleratead    HTN: holding home amlodipine. Hypotensive overnight after receiving ativan. Normotensive this morning.  HLD: continue home statin Chronic pain: continue home oxycodone-acetaminophen with hold parameters   Dispo: Anticipated discharge in 2 days pending TEE  Isabelle Course, MD 12/06/2018, 10:11 AM Pager: [phone]

## 2018-12-06 NOTE — Progress Notes (Signed)
Physical Therapy Treatment Patient Details Name: Seth [name] MRN: [id number] DOB: [birth year] Today's Date: 12/06/2018    History of Present Illness 38yoM with HTN, hyperlipidemia, type 2 diabetes, ESRD on MWF HD, OSA on cpap, who presented to the hospital on 11/28/18 with hyperkalemia 7.1 after missing one HD session, MSSA bacteremia (likely due to infected R IJ HD catheter (removed by vascular surgery 11/30/18), acute hypoxic respiratory failure with respiratory acidosis and mixed anion gap metabolic acidosis. L internal jugular vein tunneled dialysis catherter placement on 12/03/18.      PT Comments    Patient conts to do well with therapy increasding independence and stability with transfers from lower height surfaces and gait today. Feel patient will benefit from [place] before progressing to OP PT. Pt expresses he is looking forward to d/c.     Follow Up Recommendations  Home health PT     Equipment Recommendations  Rolling walker with 5" wheels    Recommendations for Other Services OT consult;Rehab consult     Precautions / Restrictions Precautions Precautions: Fall;Other (comment) Precaution Comments: monitor O2 sats with mobility.  Restrictions Weight Bearing Restrictions: No    Mobility  Bed Mobility               General bed mobility comments: patient sitting in bedside chair at entry  Transfers Overall transfer level: Needs assistance Equipment used: Rolling walker (2 wheeled) Transfers: Sit to/from Stand Sit to Stand: Supervision         General transfer comment: close supervision for safety as it took pt multiple attempts to get to standing and stabilize his balance from low bed.   Ambulation/Gait Ambulation/Gait assistance: Supervision Gait Distance (Feet): 150 Feet Assistive device: Rolling walker (2 wheeled) Gait Pattern/deviations: Step-through pattern Gait velocity: decreased   General Gait Details: ambulating with increased independence and  stability   Stairs             Wheelchair Mobility    Modified Rankin (Stroke Patients Only)       Balance Overall balance assessment: Needs assistance Sitting-balance support: Feet supported;No upper extremity supported Sitting balance-Leahy Scale: Good     Standing balance support: Bilateral upper extremity supported;No upper extremity supported;Single extremity supported Standing balance-Leahy Scale: Fair                              Cognition Arousal/Alertness: Awake/alert Behavior During Therapy: WFL for tasks assessed/performed Overall Cognitive Status: Within Functional Limits for tasks assessed                                        Exercises      General Comments        Pertinent Vitals/Pain Pain Assessment: No/denies pain    Home Living                      Prior Function            PT Goals (current goals can now be found in the care plan section) Acute Rehab PT Goals Patient Stated Goal: get stronger return home PT Goal Formulation: With patient Time For Goal Achievement: 12/15/18 Potential to Achieve Goals: Good Progress towards PT goals: Progressing toward goals    Frequency    Min 3X/week      PT Plan Discharge plan needs to be updated  Co-evaluation              AM-PAC PT "6 Clicks" Mobility   Outcome Measure  Help needed turning from your back to your side while in a flat bed without using bedrails?: None Help needed moving from lying on your back to sitting on the side of a flat bed without using bedrails?: A Little Help needed moving to and from a bed to a chair (including a wheelchair)?: None Help needed standing up from a chair using your arms (e.g., wheelchair or bedside chair)?: None Help needed to walk in hospital room?: A Little Help needed climbing 3-5 steps with a railing? : A Little 6 Click Score: 21    End of Session Equipment Utilized During Treatment: Gait  belt Activity Tolerance: Patient limited by fatigue Patient left: in chair;with call bell/phone within reach Nurse Communication: Mobility status PT Visit Diagnosis: Unsteadiness on feet (R26.81)     Time: 8185-6314 PT Time Calculation (min) (ACUTE ONLY): 18 min  Charges:  $Gait Training: 8-22 mins                     Reinaldo Berber, PT, DPT Acute Rehabilitation Services Pager: [phone] Office: [phone]     Reinaldo Berber 12/06/2018, 10:45 AM

## 2018-12-06 NOTE — Progress Notes (Signed)
Occupational Therapy Treatment Patient Details Name: Seth [name] MRN: [id number] DOB: 1965/12/11 Today's Date: 12/06/2018    History of present illness 85yoM with HTN, hyperlipidemia, type 2 diabetes, ESRD on MWF HD, OSA on cpap, who presented to the hospital on 11/28/18 with hyperkalemia 7.1 after missing one HD session, MSSA bacteremia (likely due to infected R IJ HD catheter (removed by vascular surgery 11/30/18), acute hypoxic respiratory failure with respiratory acidosis and mixed anion gap metabolic acidosis. L internal jugular vein tunneled dialysis catherter placement on 12/03/18.     OT comments  Upon entering the room, pt seated in recliner chair and fully dressed. He reports he washed and dressed self this morning with set up A to obtain needed items. Pt demonstrated use of B UEs for strengthening with use of level 2 resistive band with min cuing for proper technique. OT providing education regarding energy conservation with self care and community mobility tasks as well. Pt excited about upcoming discharge home. Education to continue.  Follow Up Recommendations  Home health OT;Supervision - Intermittent    Equipment Recommendations  3 in 1 bedside commode;Tub/shower seat    Recommendations for Other Services      Precautions / Restrictions Precautions Precautions: Fall;Other (comment) Precaution Comments: monitor O2 sats with mobility.  Restrictions Weight Bearing Restrictions: No       Mobility Bed Mobility    General bed mobility comments: patient sitting in bedside chair at entry  Transfers Overall transfer level: Needs assistance Equipment used: Rolling walker (2 wheeled) Transfers: Sit to/from Stand Sit to Stand: Supervision    General transfer comment: close supervision for safety as it took pt multiple attempts to get to standing and stabilize his balance from low bed.     Balance Overall balance assessment: Needs assistance Sitting-balance support: Feet  supported;No upper extremity supported Sitting balance-Leahy Scale: Good     Standing balance support: Bilateral upper extremity supported;No upper extremity supported;Single extremity supported Standing balance-Leahy Scale: Fair      ADL either performed or assessed with clinical judgement        Vision Patient Visual Report: No change from baseline            Cognition Arousal/Alertness: Awake/alert Behavior During Therapy: WFL for tasks assessed/performed Overall Cognitive Status: Within Functional Limits for tasks assessed                      Pertinent Vitals/ Pain       Pain Assessment: No/denies pain         Frequency  Min 2X/week        Progress Toward Goals  OT Goals(current goals can now be found in the care plan section)  Progress towards OT goals: Progressing toward goals  Acute Rehab OT Goals Patient Stated Goal: get stronger return home OT Goal Formulation: With patient Time For Goal Achievement: 12/20/18 Potential to Achieve Goals: Good  Plan Discharge plan remains appropriate       AM-PAC OT "6 Clicks" Daily Activity     Outcome Measure   Help from another person eating meals?: None Help from another person taking care of personal grooming?: A Little Help from another person toileting, which includes using toliet, bedpan, or urinal?: A Little Help from another person bathing (including washing, rinsing, drying)?: A Little Help from another person to put on and taking off regular upper body clothing?: A Little   6 Click Score: 16    End of Session  OT Visit Diagnosis: Other abnormalities of gait and mobility (R26.89);Muscle weakness (generalized) (M62.81)   Activity Tolerance Patient tolerated treatment well   Patient Left in chair;with call bell/phone within reach           Time: 5525-8948 OT Time Calculation (min): 16 min  Charges: OT General Charges $OT Visit: 1 Visit OT Treatments $Therapeutic Activity: 8-22  mins  Kenzie Flakes P, MS, OTR/L 12/06/2018, 11:31 AM

## 2018-12-07 ENCOUNTER — Encounter (HOSPITAL_COMMUNITY)
Admission: EM | Disposition: A | Discharge status: Home Health | Payer: Self-pay | Source: Home / Self Care | Attending: Internal Medicine

## 2018-12-07 ENCOUNTER — Encounter (HOSPITAL_COMMUNITY): Payer: Self-pay | Admitting: *Deleted

## 2018-12-07 ENCOUNTER — Inpatient Hospital Stay (HOSPITAL_COMMUNITY): Payer: Medicare Other | Admitting: Anesthesiology

## 2018-12-07 ENCOUNTER — Inpatient Hospital Stay (HOSPITAL_COMMUNITY): Payer: Medicare Other

## 2018-12-07 DIAGNOSIS — I120 Hypertensive chronic kidney disease with stage 5 chronic kidney disease or end stage renal disease: Secondary | ICD-10-CM | POA: Diagnosis not present

## 2018-12-07 DIAGNOSIS — R7881 Bacteremia: Secondary | ICD-10-CM

## 2018-12-07 DIAGNOSIS — N186 End stage renal disease: Secondary | ICD-10-CM | POA: Diagnosis not present

## 2018-12-07 HISTORY — PX: TEE WITHOUT CARDIOVERSION: SHX5443

## 2018-12-07 LAB — RENAL FUNCTION PANEL
Albumin: 2.9 g/dL — ABNORMAL LOW (ref 3.5–5.0)
Anion gap: 15 (ref 5–15)
BUN: 93 mg/dL — ABNORMAL HIGH (ref 6–20)
CO2: 23 mmol/L (ref 22–32)
Calcium: 8.5 mg/dL — ABNORMAL LOW (ref 8.9–10.3)
Chloride: 92 mmol/L — ABNORMAL LOW (ref 98–111)
Creatinine, Ser: 9.99 mg/dL — ABNORMAL HIGH (ref 0.61–1.24)
GFR calc Af Amer: 6 mL/min — ABNORMAL LOW (ref >60)
GFR calc non Af Amer: 5 mL/min — ABNORMAL LOW (ref >60)
Glucose, Bld: 215 mg/dL — ABNORMAL HIGH (ref 70–99)
Phosphorus: 7.4 mg/dL — ABNORMAL HIGH (ref 2.5–4.6)
Potassium: 4.7 mmol/L (ref 3.5–5.1)
Sodium: 130 mmol/L — ABNORMAL LOW (ref 135–145)

## 2018-12-07 LAB — CBC
HCT: 33.9 % — ABNORMAL LOW (ref 39.0–52.0)
HCT: 34.5 % — ABNORMAL LOW (ref 39.0–52.0)
Hemoglobin: 10.5 g/dL — ABNORMAL LOW (ref 13.0–17.0)
Hemoglobin: 10.5 g/dL — ABNORMAL LOW (ref 13.0–17.0)
MCH: 29.8 pg (ref 26.0–34.0)
MCH: 29.2 pg (ref 26.0–34.0)
MCHC: 31 g/dL (ref 30.0–36.0)
MCHC: 30.4 g/dL (ref 30.0–36.0)
MCV: 96.3 fL (ref 80.0–100.0)
MCV: 96.1 fL (ref 80.0–100.0)
Platelets: 321 10*3/uL (ref 150–400)
Platelets: 294 10*3/uL (ref 150–400)
RBC: 3.52 MIL/uL — ABNORMAL LOW (ref 4.22–5.81)
RBC: 3.59 MIL/uL — ABNORMAL LOW (ref 4.22–5.81)
RDW: 14.6 % (ref 11.5–15.5)
RDW: 14.5 % (ref 11.5–15.5)
WBC: 13.2 10*3/uL — ABNORMAL HIGH (ref 4.0–10.5)
WBC: 11.9 10*3/uL — AB (ref 4.0–10.5)
nRBC: 0 % (ref 0.0–0.2)
nRBC: 0 % (ref 0.0–0.2)

## 2018-12-07 LAB — POCT I-STAT 4, (NA,K, GLUC, HGB,HCT)
Glucose, Bld: 153 mg/dL — ABNORMAL HIGH (ref 70–99)
HCT: 35 % — ABNORMAL LOW (ref 39.0–52.0)
Hemoglobin: 11.9 g/dL — ABNORMAL LOW (ref 13.0–17.0)
Potassium: 4.5 mmol/L (ref 3.5–5.1)
Sodium: 132 mmol/L — ABNORMAL LOW (ref 135–145)

## 2018-12-07 LAB — GLUCOSE, CAPILLARY
Glucose-Capillary: 180 mg/dL — ABNORMAL HIGH (ref 70–99)
Glucose-Capillary: 273 mg/dL — ABNORMAL HIGH (ref 70–99)
Glucose-Capillary: 167 mg/dL — ABNORMAL HIGH (ref 70–99)

## 2018-12-07 SURGERY — ECHOCARDIOGRAM, TRANSESOPHAGEAL
Anesthesia: Monitor Anesthesia Care

## 2018-12-07 MED ORDER — HEPARIN SODIUM (PORCINE) 1000 UNIT/ML IJ SOLN
INTRAMUSCULAR | Status: AC
  Administered 2018-12-07: 3900 [IU]
  Filled 2018-12-07: qty 4

## 2018-12-07 MED ORDER — SODIUM CHLORIDE 0.9 % IV SOLN
INTRAVENOUS | Status: DC
  Administered 2018-12-07 (×2): via INTRAVENOUS

## 2018-12-07 MED ORDER — LIDOCAINE HCL (CARDIAC) PF 100 MG/5ML IV SOSY
PREFILLED_SYRINGE | INTRAVENOUS | Status: DC | PRN
  Administered 2018-12-07: 30 mg via INTRAVENOUS

## 2018-12-07 MED ORDER — LANTHANUM CARBONATE 500 MG PO CHEW
1000.0000 mg | CHEWABLE_TABLET | Freq: Three times a day (TID) | ORAL | Status: DC
  Administered 2018-12-07: 1000 mg via ORAL
  Filled 2018-12-07 (×5): qty 2

## 2018-12-07 MED ORDER — PROPOFOL 10 MG/ML IV BOLUS
INTRAVENOUS | Status: DC | PRN
  Administered 2018-12-07 (×2): 50 mg via INTRAVENOUS
  Administered 2018-12-07: 30 mg via INTRAVENOUS
  Administered 2018-12-07: 40 mg via INTRAVENOUS
  Administered 2018-12-07: 50 mg via INTRAVENOUS

## 2018-12-07 NOTE — Anesthesia Procedure Notes (Addendum)
Procedure Name: Intubation Date/Time: 12/07/2018 1:50 PM Performed by: [name], MD Pre-anesthesia Checklist: Timeout performed, Patient being monitored, Suction available, Emergency Drugs available and Patient identified Patient Re-evaluated:Patient Re-evaluated prior to induction Oxygen Delivery Method: Circle system utilized Preoxygenation: Pre-oxygenation with 100% oxygen Ventilation: Two handed mask ventilation required and Oral airway inserted - appropriate to patient size Laryngoscope Size: Glidescope and 3 Grade View: Grade I Tube type: Oral Tube size: 8.0 mm Number of attempts: 2 (Attempt by CRNA with mac4 DL, grade IV) Placement Confirmation: breath sounds checked- equal and bilateral,  positive ETCO2 and ETT inserted through vocal cords under direct vision Secured at: 23 cm Tube secured with: Tape Dental Injury: Injury to lip  Difficulty Due To: Difficulty was unanticipated

## 2018-12-07 NOTE — Anesthesia Procedure Notes (Addendum)
Procedure Name: MAC Date/Time: 12/07/2018 1:28 PM Performed by: Neldon Newport, CRNA Pre-anesthesia Checklist: Timeout performed, Patient being monitored, Suction available, Emergency Drugs available and Patient identified Patient Re-evaluated:Patient Re-evaluated prior to induction Oxygen Delivery Method: Nasal cannula

## 2018-12-07 NOTE — Progress Notes (Signed)
ID PROGRESS NOTE   24hr event - patient underwent TEE which ruled out endocarditis. Remains afebrile  A/P: MRSA bacteremia related to HD catheter now removed. No evidence of enodcarditis or discitis - plan for 2 wks of cefazolin post HD - use 2/22 as day 1 and march 7th would be last day of IV abtx  Will sign off. Call if questions  Caren Griffins [facility] [phone]

## 2018-12-07 NOTE — Progress Notes (Signed)
Physical Therapy Treatment Patient Details Name: Seth [name] MRN: [id number] DOB: 03/09/1966 Today's Date: 12/07/2018    History of Present Illness 62yoM with HTN, hyperlipidemia, type 2 diabetes, ESRD on MWF HD, OSA on cpap, who presented to the hospital on 11/28/18 with hyperkalemia 7.1 after missing one HD session, MSSA bacteremia (likely due to infected R IJ HD catheter (removed by vascular surgery 11/30/18), acute hypoxic respiratory failure with respiratory acidosis and mixed anion gap metabolic acidosis. L internal jugular vein tunneled dialysis catherter placement on 12/03/18.      PT Comments    Patient limited today reports fatigue from HD and TEE. Walking shorter distances today, supervision level for all mobility. Cont to rec HHPT for reconditioning.    Follow Up Recommendations  Home health PT     Equipment Recommendations  Rolling walker with 5" wheels    Recommendations for Other Services OT consult;Rehab consult     Precautions / Restrictions Precautions Precautions: Fall;Other (comment) Precaution Comments: monitor O2 sats with mobility.  Restrictions Weight Bearing Restrictions: No    Mobility  Bed Mobility Overal bed mobility: Modified Independent             General bed mobility comments: patient sitting in bedside chair at entry  Transfers Overall transfer level: Needs assistance Equipment used: Rolling walker (2 wheeled) Transfers: Sit to/from Stand Sit to Stand: Supervision         General transfer comment: close supervision for safety as it took pt multiple attempts to get to standing and stabilize his balance from low bed.   Ambulation/Gait Ambulation/Gait assistance: Supervision Gait Distance (Feet): 40 Feet Assistive device: Rolling walker (2 wheeled) Gait Pattern/deviations: Step-through pattern Gait velocity: decreased   General Gait Details: pt ambulating short distances feels fatigued after HD and TEE   Stairs              Wheelchair Mobility    Modified Rankin (Stroke Patients Only)       Balance Overall balance assessment: Needs assistance Sitting-balance support: Feet supported;No upper extremity supported Sitting balance-Leahy Scale: Good     Standing balance support: Bilateral upper extremity supported;No upper extremity supported;Single extremity supported Standing balance-Leahy Scale: Fair                              Cognition Arousal/Alertness: Awake/alert Behavior During Therapy: WFL for tasks assessed/performed Overall Cognitive Status: Within Functional Limits for tasks assessed                                        Exercises      General Comments        Pertinent Vitals/Pain Pain Assessment: No/denies pain    Home Living                      Prior Function            PT Goals (current goals can now be found in the care plan section) Acute Rehab PT Goals Patient Stated Goal: get stronger return home PT Goal Formulation: With patient Time For Goal Achievement: 12/15/18 Potential to Achieve Goals: Good Progress towards PT goals: Progressing toward goals    Frequency    Min 3X/week      PT Plan Discharge plan needs to be updated    Co-evaluation  AM-PAC PT "6 Clicks" Mobility   Outcome Measure  Help needed turning from your back to your side while in a flat bed without using bedrails?: None Help needed moving from lying on your back to sitting on the side of a flat bed without using bedrails?: A Little Help needed moving to and from a bed to a chair (including a wheelchair)?: None Help needed standing up from a chair using your arms (e.g., wheelchair or bedside chair)?: None Help needed to walk in hospital room?: A Little Help needed climbing 3-5 steps with a railing? : A Little 6 Click Score: 21    End of Session Equipment Utilized During Treatment: Gait belt Activity Tolerance: Patient  limited by fatigue Patient left: in chair;with call bell/phone within reach Nurse Communication: Mobility status PT Visit Diagnosis: Unsteadiness on feet (R26.81)     Time: 1700-1720 PT Time Calculation (min) (ACUTE ONLY): 20 min  Charges:  $Gait Training: 8-22 mins                     Reinaldo Berber, PT, DPT Acute Rehabilitation Services Pager: [phone] Office: [phone]     Reinaldo Berber 12/07/2018, 4:53 PM

## 2018-12-07 NOTE — Anesthesia Preprocedure Evaluation (Signed)
Anesthesia Evaluation  Patient identified by MRN, date of birth, ID band Patient awake    Reviewed: Allergy & Precautions, NPO status , Patient's Chart, lab work & pertinent test results  Airway Mallampati: III  TM Distance: >3 FB Neck ROM: Full    Dental  (+) Missing   Pulmonary sleep apnea and Continuous Positive Airway Pressure Ventilation ,    Pulmonary exam normal breath sounds clear to auscultation       Cardiovascular hypertension, Pt. on medications + Past MI  Normal cardiovascular exam Rhythm:Regular Rate:Normal  ECG: rate 96. Normal sinus rhythm Left anterior fascicular block Possible Anterolateral infarct , age undetermined Left atrial abnormality First degree heart block  ECHO: 1. The left ventricle has hyperdynamic systolic function, with an ejection fraction of >65%. The cavity size was normal. Left ventricular diastolic Doppler parameters are consistent with impaired relaxation.  2. The right ventricle has normal systolic function. The cavity was normal. There is no increase in right ventricular wall thickness.  3. The tricuspid valve is normal in structure.  4. The aortic valve was not well visualized Mild thickening of the aortic valve Mild calcification of the aortic valve.  5. The pulmonic valve was normal in structure.  6. No vegetations detected.   Neuro/Psych PSYCHIATRIC DISORDERS Anxiety negative neurological ROS     GI/Hepatic negative GI ROS, Neg liver ROS,   Endo/Other  diabetes, Insulin DependentMorbid obesityhyponatremia  Renal/GU ESRF and DialysisRenal diseaseOn M, W, F     Musculoskeletal negative musculoskeletal ROS (+)   Abdominal (+) + obese,   Peds  Hematology  (+) anemia , HLD   Anesthesia Other Findings END STAGE RENAL DISEASE  Reproductive/Obstetrics                             Anesthesia Physical  Anesthesia Plan  ASA: IV  Anesthesia Plan: MAC    Post-op Pain Management:    Induction: Intravenous  PONV Risk Score and Plan: 1 and Ondansetron, Treatment may vary due to age or medical condition and Propofol infusion  Airway Management Planned:   Additional Equipment:   Intra-op Plan:   Post-operative Plan:   Informed Consent: I have reviewed the patients History and Physical, chart, labs and discussed the procedure including the risks, benefits and alternatives for the proposed anesthesia with the patient or authorized representative who has indicated his/her understanding and acceptance.     Dental advisory given  Plan Discussed with: CRNA  Anesthesia Plan Comments:         Anesthesia Quick Evaluation

## 2018-12-07 NOTE — Progress Notes (Signed)
  Date: 12/07/2018  Patient name: BERNIS SCHREUR  Medical record number: [id number]  Date of birth: 05-22-1966   I have seen and evaluated this patient and I have discussed the plan of care with the house staff. Please see their note for complete details. I concur with their findings with the following additions/corrections:   Went for TEE today, unfortunately had laryngospasm and apnea, required intubation. I was not able to examine him after this event, but evidently he was extubated about 30 minutes later and has since been weaned to room air. TEE was incomplete, but did not show signs of vegetation.   At this point, seems to be straightforward case of MSSA bacteremia related to temporary dialysis catheter, which has been removed and replaced by vascular surgery after a line holiday. Plan to continue IV cefazolin with dialysis until 3/7 per ID. Likely discharge in am if doing well.  Lenice Pressman, M.D., Ph.D. 12/07/2018, 10:23 PM

## 2018-12-07 NOTE — Transfer of Care (Signed)
Immediate Anesthesia Transfer of Care Note  Patient: Seth [name]  Procedure(s) Performed: TRANSESOPHAGEAL ECHOCARDIOGRAM (TEE) (N/A )  Patient Location: Endoscopy Unit  Anesthesia Type:MAC and General  Level of Consciousness: awake, alert  and oriented  Airway & Oxygen Therapy: Patient Spontanous Breathing and Patient connected to nasal cannula oxygen  Post-op Assessment: Report given to RN, Post -op Vital signs reviewed and stable and Patient moving all extremities X 4  Post vital signs: Reviewed and stable  Last Vitals:  Vitals Value Taken Time  BP 126/68 12/07/2018  2:22 PM  Temp    Pulse 91 12/07/2018  2:30 PM  Resp 22 12/07/2018  2:30 PM  SpO2 97 % 12/07/2018  2:30 PM  Vitals shown include unvalidated device data.  Last Pain:  Vitals:   12/07/18 1307  TempSrc: Oral  PainSc: 0-No pain      Patients Stated Pain Goal: 0 (85/63/14 9702)  Complications: No apparent anesthesia complications

## 2018-12-07 NOTE — Procedures (Signed)
I was present at this dialysis session. I have reviewed the session itself and made appropriate changes.   Vital signs in last 24 hours:  Temp:  [97.4 F (36.3 C)-97.6 F (36.4 C)] 97.5 F (36.4 C) (02/28 0655) Pulse Rate:  [77-83] 83 (02/28 0800) Resp:  [16-20] 16 (02/28 0655) BP: (97-170)/(57-107) 97/57 (02/28 0800) SpO2:  [93 %-100 %] 96 % (02/28 0655) Weight:  [142.3 kg] 142.3 kg (02/28 0655) Weight change: 0.1 kg Filed Weights   12/05/18 0755 12/05/18 1207 12/07/18 0655  Weight: (!) 142.2 kg (!) 139.2 kg (!) 142.3 kg    Recent Labs  Lab 12/07/18 0247  NA 130*  K 4.7  CL 92*  CO2 23  GLUCOSE 215*  BUN 93*  CREATININE 9.99*  CALCIUM 8.5*  PHOS 7.4*    Recent Labs  Lab 12/05/18 0303 12/06/18 0627 12/07/18 0247  WBC 13.2* 12.6* 13.2*  HGB 10.9* 11.2* 10.5*  HCT 35.2* 36.7* 33.9*  MCV 95.9 97.1 96.3  PLT 299 320 321    Scheduled Meds: . aspirin EC  81 mg Oral Daily  . atorvastatin  40 mg Oral q1800  . chlorhexidine  15 mL Mouth Rinse BID  . Chlorhexidine Gluconate Cloth  6 each Topical Q0600  . Chlorhexidine Gluconate Cloth  6 each Topical Q0600  . ferric citrate  630 mg Oral TID WC  . heparin  5,000 Units Subcutaneous Q8H  . insulin aspart  0-15 Units Subcutaneous TID WC  . insulin aspart  0-5 Units Subcutaneous QHS  . insulin aspart  6 Units Subcutaneous TID WC  . insulin glargine  22 Units Subcutaneous QHS  . mouth rinse  15 mL Mouth Rinse q12n4p  . mupirocin ointment  1 application Nasal BID   Continuous Infusions: . albuterol Stopped (11/28/18 1100)  .  ceFAZolin (ANCEF) IV Stopped (12/06/18 0351)   PRN Meds:.ferric citrate, oxyCODONE-acetaminophen **AND** oxyCODONE, polyethylene glycol, senna-docusate    Dialysis:[facility] MWF [address] 4h 138.5kg Heparin 4500 + 1000/hr LIJ TDC  Assessment/ Plan:  1. MSSA bacteremia- had catheter holiday over weekend and new RIJ tdc placed yesterday prior to HD.  1. MRI of spine negative  for osteo or diskitis 2. TEE still pending to r/o SBE 3. Continue with ancef per ID and can be administered at outpatient HD 2. ESRDcontinue with HD q MWF and plan for HD tomorrow if he remains an inpatient. 3. Anemia:stable Hgb 4. CHF/volume overload- cont to UF with HD to edw 5. CKD-MBD:phos elevated, will add fosrenol (was taking [place] as an outpatient). 6. Nutrition:renal diet 7. Hypertension:stable 8. Vascular access- new LIJ TDC placed 2/24/20and appreciate VVS to evaluate for possible BVT vs avg 9. Disposition - discharge pending TEE.   Seth Potts,  MD 12/07/2018, 8:25 AM

## 2018-12-07 NOTE — Progress Notes (Signed)
Patient places himself on CPAP and will call if any additional help needed

## 2018-12-07 NOTE — CV Procedure (Signed)
    Transesophageal Echocardiogram Note  Seth [name] [id number] 02-01-1966  Procedure: Transesophageal Echocardiogram Indications: Bacteremia   Procedure Details Consent: Obtained Time Out: Verified patient identification, verified procedure, site/side was marked, verified correct patient position, special equipment/implants available, Radiology Safety Procedures followed,  medications/allergies/relevent history reviewed, required imaging and test results available.  Performed  Medications:  During this procedure the patient is administered a  profofol drip by CRNA, Jenny Reichmann.  He had respiratory arrest during the procedure and also received Succinate 250 mg and Epinephrine IV 100 mcg.   The patient had conscious sedation with the assistance of anesthesia He is morbidly obese and has a thick neck.   He had severe apnea and had lyngospasm during the procedure . It required stopping the procedure early and intubation .   Left Ventrical:  Normal   Mitral Valve: no vegetation   Aortic Valve:  No vegetation   Tricuspid Valve: no vegetation   Pulmonic Valve: no vegetation   Left Atrium/ Left atrial appendage: no thrombi   Atrial septum: poorly visualized   Aorta:   Dialysis catheter :  Not well visualized due to having to abort the procedure    Complications: Complications of respiratory arrest during the procedure requiring intubation .   The procedure was stopped at that point.   Patient did not tolerate procedure well.   Thayer Headings, Brooke Bonito., MD, [facility] 12/07/2018, 2:31 PM

## 2018-12-07 NOTE — Progress Notes (Signed)
   Subjective: Seems a little down today. States it has not been a good day since he was npo at midnight. He is eager to go home, but understands the importance of getting the TEE. Discussed possibility of discharge if TEE is normal.   Objective:  Vital signs in last 24 hours: Vitals:   12/07/18 1000 12/07/18 1030 12/07/18 1100 12/07/18 1106  BP: (!) 85/59 (!) 89/59 101/65 104/60  Pulse: 82 82 88 84  Resp:    16  Temp:    98.5 F (36.9 C)  TempSrc:    Oral  SpO2:    96%  Weight:      Height:       Gen: NAD, in HD Cardiac: RRR Pulm: breathing comfortably on [place]  Antibiotics: Azithromycin 2/19-2/19 Ceftriaxone 2/19-2/19 Cefazolin 2/20 - present   Micro:  2/19 BC x2: Staph aureus, pan sensitive   2/21 Cath Tip culture: MRSA, candida albicans   2/22 repeat BC: NG at 5d, final   Assessment/Plan:  Principal Problem:   Bacteremia due to methicillin susceptible Staphylococcus aureus (MSSA) Active Problems:   Hyperlipidemia associated with type 2 diabetes mellitus ([place])   Morbid obesity ([place])   Hypertension associated with diabetes ([place])   End stage renal disease on dialysis ([place])   OSA (obstructive sleep apnea)   Controlled type 2 diabetes mellitus with chronic kidney disease on chronic dialysis ([name])   Hyperkalemia   NSTEMI (non-ST elevated myocardial infarction) ([place])   Acute respiratory failure with hypoxia and hypercapnia ([name])   Hyponatremia  [name] with HTN, hyperlipidemia, type 2 diabetes, ESRD on MWF HD, OSA on cpap, who presented with hyperkalemia 7.1 after missing one HD session, MSSA bacteremia, acute hypoxic respiratory failure with respiratory acidosis and mixed anion gap metabolic acidosis.   ESRD on HD: Left tunneled IJ catheter in place. Vascular surgery planning for most likely AVG at a later date, after this current infection is completely treated. - HD per nephro, at EDW  MSSA Bacteremia: Stable. Remains afebrile with good vital signs. Leukocytosis  improved from admission but continues to be elevated. Feels well. Repeat blood cultures after infected RIJ catheter was removed are now no growth at 5 days and final. MRI lumbar spine without evidence of osteomyelitis. Unable to get T spine due to hardware from previous surgery and patient's anxiety made it difficult to continue the MRI. Planning for TEE this afternoon.  - appreciate ID and nephrology's assistance  - continue cefazolin, ID plans to treat as complicated bacteremia. Course will be determined pending TEE results.  - repeat blood cultures are final and no growth  - f/u TEE results today   Type 2 DM: CBGs well controlled  - lantus 22U qhs - novolog 6U tid wc - continue SSI moderate  OSA, OHS: maintaining oxygen saturations > 90% on RA during my evaluation. Does not wear home oxygen  - cpap qhs  - [place] during the day, wean as toleratead    HTN: holding home amlodipine. Has been hypotensive over the last 24hrs HLD: continue home statin Chronic pain: continue home oxycodone-acetaminophen with hold parameters   Dispo: Anticipated discharge pending TEE results   Isabelle Course, MD 12/07/2018, 11:30 AM Pager: [phone]

## 2018-12-08 DIAGNOSIS — N186 End stage renal disease: Secondary | ICD-10-CM | POA: Diagnosis not present

## 2018-12-08 DIAGNOSIS — Z9119 Patient's noncompliance with other medical treatment and regimen: Secondary | ICD-10-CM

## 2018-12-08 DIAGNOSIS — Z992 Dependence on renal dialysis: Secondary | ICD-10-CM | POA: Diagnosis not present

## 2018-12-08 DIAGNOSIS — Z6841 Body Mass Index (BMI) 40.0 and over, adult: Secondary | ICD-10-CM

## 2018-12-08 LAB — GLUCOSE, CAPILLARY
Glucose-Capillary: 180 mg/dL — ABNORMAL HIGH (ref 70–99)
Glucose-Capillary: 152 mg/dL — ABNORMAL HIGH (ref 70–99)

## 2018-12-08 MED ORDER — CEFAZOLIN IV (FOR PTA / DISCHARGE USE ONLY): 2.0000 g | INTRAVENOUS | 0 refills | Status: AC

## 2018-12-08 NOTE — Discharge Instructions (Signed)
You will be called with an appointment to follow-up with your primary care provider for hospital discharge appointment.  Please make certain that this is completed by the end of next week.  If you have any questions please feel free to call your primary care clinic. As we discussed, I recommend discussing obtaining a new CPAP machine with your home health service and discussing this with your primary care provider.

## 2018-12-08 NOTE — Progress Notes (Signed)
  Date: 12/08/2018  Patient name: Seth [name]  Medical record number: [id number]  Date of birth: 02-26-1966   I have seen and evaluated this patient and I have discussed the plan of care with the house staff. Please see Dr. Nelma Rothman note for complete details. I concur with his findings with the following additions/corrections:   Please note incorrect dragon text in Dr. Nelma Rothman note.  Last day of antibiotics will be March 7 per ID.  Discharge today.   Sid Falcon, MD 12/08/2018, 3:18 PM

## 2018-12-08 NOTE — Progress Notes (Signed)
   Subjective: Patient was sitting upright on the edge of his bed today upon entering the room.  He denies acute concerns including dyspnea.  He feels well overall is able to breathe absent difficulty.  He is in agreement with the plan to be discharged home today is aware that he is to continue to bite therapy hemodialysis Monday Wednesday and Friday.  Objective:  Vital signs in last 24 hours: Vitals:   12/07/18 1512 12/07/18 1752 12/07/18 2332 12/08/18 0741  BP: (!) 147/107 (!) 144/86 109/65 (!) 121/55  Pulse: 90 84 85 79  Resp:  20  18  Temp:  (!) 97.4 F (36.3 C) 98.2 F (36.8 C) (!) 97.4 F (36.3 C)  TempSrc:  Oral Oral Oral  SpO2:  93% 96% 95%  Weight:      Height:       General: A/O x4, in no acute distress, afebrile, nondiaphoretic Cardio: RRR, no mrg's Pulmonary: CTA bilaterally Neuro: Alert, conversational Psych: Appropriate affect, not depressed in appearance, engages well  Assessment/Plan:  Principal Problem:   Bacteremia due to methicillin susceptible Staphylococcus aureus (MSSA) Active Problems:   Hyperlipidemia associated with type 2 diabetes mellitus ([place])   Morbid obesity ([name])   Hypertension associated with diabetes ([place])   End stage renal disease on dialysis ([name])   OSA (obstructive sleep apnea)   Controlled type 2 diabetes mellitus with chronic kidney disease on chronic dialysis ([name])   Hyperkalemia   NSTEMI (non-ST elevated myocardial infarction) ([name])   Acute respiratory failure with hypoxia and hypercapnia ([name])   Hyponatremia  53 year old male hypertension, HLD, type 2 diabetes, he said he went HD MWF, OSA on home CPAP, and with hyperkalemia 7.1 to missing HD session, MSSA bacteremia, and acute hypoxic respiratory failure secondary respiratory doses and mixed anion gap metabolic acidosis.  Plan: ESRD on HD MWF: Continue on dialysis on discharge patient currently appears stable similar hypoxic is improved symptomatically.  Nephrology recommended  continue home dialysis.  MSSA bacteremia: Patient is remained afebrile, with normal vital signs.  Plan is to complete 2 weeks of IV antibiotics.  This will be completed in his HD sessions Monday Wednesday and Friday with 2 g cefazolin.  OPAT completed by pharmacy, nephrology is aware.  TEE unremarkable.  Last dose of Dilaudid smart seventh as per ID.  Continue to trend blood cultures.  Dispo: Anticipated discharge in approximately today.   Kathi Ludwig, MD 12/08/2018, 10:22 AM Pager: Pager# [phone]

## 2018-12-08 NOTE — Care Management Note (Addendum)
Case Management Note  Patient Details  Name: MARVYN TORREZ MRN: [id number] Date of Birth: 21-Sep-1966  Subjective/Objective:     From home, HD patient for MRI today, on 5 liters , per pt eval rec HHPT,   HHOT rec also. NCM offered choice , he chose [place], referral given to [employer] with [place].  Soc will begin 24-48 hrs post dc.     2/29 Tomi Bamberger RN, BSN- Dian Situ with [facility] notified to add [facility] and that patient is for dc today.                               Action/Plan: DC home when ready.   Expected Discharge Date:  12/08/18               Expected Discharge Plan:  [place]  In-House Referral:     Discharge planning Services  CM Consult  Post Acute Care Choice:  Home Health Choice offered to:  Patient  DME Arranged:  [facility] D[facility] Agency:  [place] Arranged:  PT, OT [facility] Agency:  [place]  Status of Service:  Completed, signed off  If discussed at [place] of Stay Meetings, dates discussed:    Additional Comments:  Zenon Mayo, RN 12/08/2018, 11:29 AM

## 2018-12-08 NOTE — Progress Notes (Signed)
[name], male   DOB: Jul 06, 1966, 53 y.o.   MRN: [id number] S: Feels well, no complaints O:BP (!) 121/55 (BP Location: Left Leg)   Pulse 79   Temp (!) 97.4 F (36.3 C) (Oral)   Resp 18   Ht 5\' 7"  (1.702 m)   Wt (!) 140.3 kg   SpO2 95%   BMI 48.44 kg/m   Intake/Output Summary (Last 24 hours) at 12/08/2018 1014 Last data filed at 12/07/2018 2200 Gross per 24 hour  Intake 522 ml  Output 2001 ml  Net -1479 ml   Intake/Output: I/O last 3 completed shifts: In: 700 [P.O.:100; I.V.:522] Out: 2001 [Urine:1; Other:2000]  Intake/Output this shift:  No intake/output data recorded. Weight change: -2 kg Gen:NAD Ext: no edema  Recent Labs  Lab 12/02/18 0326 12/03/18 0324 12/05/18 0303 12/07/18 0247 12/07/18 1322  NA 128* 129* 131* 130* 132*  K 4.5 4.8 4.7 4.7 4.5  CL 87* 89* 93* 92*  --   CO2 21* 24 20* 23  --   GLUCOSE 262* 221* 209* 215* 153*  BUN 83* 107* 107* 93*  --   CREATININE 9.57* 11.43* 10.23* 9.99*  --   ALBUMIN  --  3.0* 3.0* 2.9*  --   CALCIUM 8.2* 8.3* 8.6* 8.5*  --   PHOS  --  6.3* 5.9* 7.4*  --    Liver Function Tests: Recent Labs  Lab 12/03/18 0324 12/05/18 0303 12/07/18 0247  ALBUMIN 3.0* 3.0* 2.9*   No results for input(s): LIPASE, AMYLASE in the last 168 hours. No results for input(s): AMMONIA in the last 168 hours. CBC: Recent Labs  Lab 12/04/18 1156 12/05/18 0303 12/06/18 0627 12/07/18 0247 12/07/18 1322 12/07/18 1829  WBC 16.0* 13.2* 12.6* 13.2*  --  11.9*  HGB 10.5* 10.9* 11.2* 10.5* 11.9* 10.5*  HCT 34.4* 35.2* 36.7* 33.9* 35.0* 34.5*  MCV 96.1 95.9 97.1 96.3  --  96.1  PLT 281 299 320 321  --  294   Cardiac Enzymes: No results for input(s): CKTOTAL, CKMB, CKMBINDEX, TROPONINI in the last 168 hours. CBG: Recent Labs  Lab 12/06/18 2113 12/07/18 1423 12/07/18 1657 12/07/18 2113 12/08/18 0739  GLUCAP 286* 180* 273* 167* 180*    Iron Studies: No results for input(s): IRON, TIBC, TRANSFERRIN, FERRITIN in the  last 72 hours. Studies/Results: No results found. [place] Kitchen aspirin EC  81 mg Oral Daily  . atorvastatin  40 mg Oral q1800  . chlorhexidine  15 mL Mouth Rinse BID  . Chlorhexidine Gluconate Cloth  6 each Topical Q0600  . ferric citrate  630 mg Oral TID WC  . heparin  5,000 Units Subcutaneous Q8H  . insulin aspart  0-15 Units Subcutaneous TID WC  . insulin aspart  0-5 Units Subcutaneous QHS  . insulin aspart  6 Units Subcutaneous TID WC  . insulin glargine  22 Units Subcutaneous QHS  . lanthanum  1,000 mg Oral TID WC  . mouth rinse  15 mL Mouth Rinse q12n4p    BMET    Component Value Date/Time   NA 132 (L) 12/07/2018 1322   K 4.5 12/07/2018 1322   CL 92 (L) 12/07/2018 0247   CO2 23 12/07/2018 0247   GLUCOSE 153 (H) 12/07/2018 1322   BUN 93 (H) 12/07/2018 0247   CREATININE 9.99 (H) 12/07/2018 0247   CALCIUM 8.5 (L) 12/07/2018 0247   CALCIUM 7.7 (L) 08/12/2015 0738   GFRNONAA 5 (L) 12/07/2018 0247   GFRAA 6 (L) 12/07/2018 1749  CBC    Component Value Date/Time   WBC 11.9 (H) 12/07/2018 1829   RBC 3.59 (L) 12/07/2018 1829   HGB 10.5 (L) 12/07/2018 1829   HCT 34.5 (L) 12/07/2018 1829   PLT 294 12/07/2018 1829   MCV 96.1 12/07/2018 1829   MCH 29.2 12/07/2018 1829   MCHC 30.4 12/07/2018 1829   RDW 14.5 12/07/2018 1829   LYMPHSABS 0.9 11/28/2018 0808   MONOABS 1.3 (H) 11/28/2018 0808   EOSABS 0.0 11/28/2018 0808   BASOSABS 0.1 11/28/2018 0808     Dialysis:[facility] MWF [address] 4h 138.5kg Heparin 4500 + 1000/hr LIJ TDC  Assessment/ Plan:  1. MSSA bacteremia- had catheter holiday over weekend and new RIJ tdc placed yesterday prior to HD.  1. MRI of spine negative for osteo or diskitis 2. TEE difficult but negative for vegetation 3. Continue with ancef through 12/15/18 per ID and can be administered at outpatient HD 2. ESRDcontinue with HD q MWF. 3. Anemia:stable Hgb 4. CHF/volume overload- cont to UF with HD to edw 5. CKD-MBD:phos elevated, will  add fosrenol (was taking [place] as an outpatient). 6. Nutrition:renal diet 7. Hypertension:stable 8. Vascular access- new LIJ TDC placed 2/24/20and appreciate VVS to evaluate for possible BVT vs avg 9. Disposition - likely discharge to home today and will f/u with his outpatient HD on Monday  Donetta Potts, MD [facility] [phone]

## 2018-12-08 NOTE — Progress Notes (Signed)
PHARMACY CONSULT NOTE FOR:  OUTPATIENT  PARENTERAL ANTIBIOTIC THERAPY (OPAT)  Indication: Bacteremia  Regimen: Cefazolin 2gm IV q MWF with HD    End date: 12/15/2018  IV antibiotic discharge orders are pended. To discharging provider:  please sign these orders via discharge navigator,  Select New Orders & click on the button choice - Manage This Unsigned Work.     Gyneth Hubka A. Levada Dy, PharmD, [place] Pager: [phone] Please utilize Amion for appropriate phone number to reach the unit pharmacist ([place])   12/08/2018, 10:29 AM

## 2018-12-08 NOTE — Progress Notes (Signed)
Pharmacy Antibiotic Note  Seth [name] is a 53 y.o. male admitted on 11/28/2018 with CAP,  BCID showed MSSA bacteremia.  Pharmacy was consulted on 11/29/18  for Ancef dosing.   ID recommended to continue IV Ancef   Plan: Continue Ancef 2gm IV after each HD -MWF until 12/15/18  Will f/u HD schedule and pt's clinical condition  Height: 5\' 7"  (170.2 cm) Weight: (!) 309 lb 4.9 oz (140.3 kg) IBW/kg (Calculated) : 66.1  Temp (24hrs), Avg:98 F (36.7 C), Min:97.4 F (36.3 C), Max:98.8 F (37.1 C)  Recent Labs  Lab 12/02/18 0326 12/03/18 0324 12/04/18 1156 12/05/18 0303 12/06/18 0627 12/07/18 0247 12/07/18 1829  WBC 15.3* 14.5* 16.0* 13.2* 12.6* 13.2* 11.9*  CREATININE 9.57* 11.43*  --  10.23*  --  9.99*  --     Estimated Creatinine Clearance: 11.5 mL/min (A) (by C-G formula based on SCr of 9.99 mg/dL (H)).    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 Levada Dy, PharmD, [place] Pager: [phone] Please utilize Amion for appropriate phone number to reach the unit pharmacist ([place])   12/08/2018 10:32 AM

## 2018-12-09 ENCOUNTER — Encounter (HOSPITAL_COMMUNITY): Payer: Self-pay | Admitting: Cardiovascular Disease

## 2018-12-10 ENCOUNTER — Telehealth: Payer: Self-pay | Admitting: *Deleted

## 2018-12-10 NOTE — Telephone Encounter (Signed)
Received call from [place] with [facility] requesting name of Attending who can sign orders for [facility] PT/OT for Dr. Alfonse Spruce. Today's Attending given Dareen Piano). Hubbard Hartshorn, RN, BSN

## 2018-12-10 NOTE — Telephone Encounter (Signed)
I agree

## 2018-12-10 NOTE — Anesthesia Postprocedure Evaluation (Signed)
Anesthesia Post Note  Patient: Seth [name]  Procedure(s) Performed: TRANSESOPHAGEAL ECHOCARDIOGRAM (TEE) (N/A )     Patient location during evaluation: PACU Anesthesia Type: General Level of consciousness: sedated and patient cooperative Pain management: pain level controlled Vital Signs Assessment: post-procedure vital signs reviewed and stable Respiratory status: spontaneous breathing Cardiovascular status: stable Anesthetic complications: no Comments: Mac converted to GA due to laryngospasm and hypoxemia.    Last Vitals:  Vitals:   12/07/18 2332 12/08/18 0741  BP: 109/65 (!) 121/55  Pulse: 85 79  Resp:  18  Temp: 36.8 C (!) 36.3 C  SpO2: 96% 95%    Last Pain:  Vitals:   12/08/18 0741  TempSrc: Oral  PainSc:                  [name]

## 2018-12-11 DIAGNOSIS — Z7982 Long term (current) use of aspirin: Secondary | ICD-10-CM | POA: Diagnosis not present

## 2018-12-11 DIAGNOSIS — M545 Low back pain: Secondary | ICD-10-CM | POA: Diagnosis not present

## 2018-12-11 DIAGNOSIS — G8929 Other chronic pain: Secondary | ICD-10-CM | POA: Diagnosis not present

## 2018-12-11 DIAGNOSIS — I120 Hypertensive chronic kidney disease with stage 5 chronic kidney disease or end stage renal disease: Secondary | ICD-10-CM | POA: Diagnosis not present

## 2018-12-11 DIAGNOSIS — M25561 Pain in right knee: Secondary | ICD-10-CM | POA: Diagnosis not present

## 2018-12-11 DIAGNOSIS — Z794 Long term (current) use of insulin: Secondary | ICD-10-CM | POA: Diagnosis not present

## 2018-12-11 DIAGNOSIS — G4733 Obstructive sleep apnea (adult) (pediatric): Secondary | ICD-10-CM | POA: Diagnosis not present

## 2018-12-11 DIAGNOSIS — Z79891 Long term (current) use of opiate analgesic: Secondary | ICD-10-CM | POA: Diagnosis not present

## 2018-12-11 DIAGNOSIS — T827XXD Infection and inflammatory reaction due to other cardiac and vascular devices, implants and grafts, subsequent encounter: Secondary | ICD-10-CM | POA: Diagnosis not present

## 2018-12-11 DIAGNOSIS — Z792 Long term (current) use of antibiotics: Secondary | ICD-10-CM | POA: Diagnosis not present

## 2018-12-11 DIAGNOSIS — M9933 Osseous stenosis of neural canal of lumbar region: Secondary | ICD-10-CM | POA: Diagnosis not present

## 2018-12-11 DIAGNOSIS — E1122 Type 2 diabetes mellitus with diabetic chronic kidney disease: Secondary | ICD-10-CM | POA: Diagnosis not present

## 2018-12-11 DIAGNOSIS — Z992 Dependence on renal dialysis: Secondary | ICD-10-CM | POA: Diagnosis not present

## 2018-12-11 DIAGNOSIS — N186 End stage renal disease: Secondary | ICD-10-CM | POA: Diagnosis not present

## 2018-12-11 DIAGNOSIS — Z6841 Body Mass Index (BMI) 40.0 and over, adult: Secondary | ICD-10-CM | POA: Diagnosis not present

## 2018-12-11 DIAGNOSIS — B9561 Methicillin susceptible Staphylococcus aureus infection as the cause of diseases classified elsewhere: Secondary | ICD-10-CM | POA: Diagnosis not present

## 2018-12-12 ENCOUNTER — Other Ambulatory Visit: Payer: Self-pay | Admitting: Internal Medicine

## 2018-12-12 DIAGNOSIS — Z79891 Long term (current) use of opiate analgesic: Secondary | ICD-10-CM | POA: Diagnosis not present

## 2018-12-13 ENCOUNTER — Telehealth: Payer: Self-pay | Admitting: Internal Medicine

## 2018-12-13 LAB — GLUCOSE, CAPILLARY
Glucose-Capillary: 159 mg/dL — ABNORMAL HIGH (ref 70–99)
Glucose-Capillary: 143 mg/dL — ABNORMAL HIGH (ref 70–99)

## 2018-12-13 NOTE — Telephone Encounter (Signed)
VO [facility]:  PT OT HHN for eval and treat for disease and medication management Do you agree?

## 2018-12-13 NOTE — Telephone Encounter (Signed)
Hardie Pulley [phone] VO; pls call Pt dx was STD per Shanon Brow pt denies the dx

## 2018-12-14 DIAGNOSIS — B9561 Methicillin susceptible Staphylococcus aureus infection as the cause of diseases classified elsewhere: Secondary | ICD-10-CM | POA: Diagnosis not present

## 2018-12-14 DIAGNOSIS — G8929 Other chronic pain: Secondary | ICD-10-CM | POA: Diagnosis not present

## 2018-12-14 DIAGNOSIS — M9933 Osseous stenosis of neural canal of lumbar region: Secondary | ICD-10-CM | POA: Diagnosis not present

## 2018-12-14 DIAGNOSIS — M545 Low back pain: Secondary | ICD-10-CM | POA: Diagnosis not present

## 2018-12-14 DIAGNOSIS — M25561 Pain in right knee: Secondary | ICD-10-CM | POA: Diagnosis not present

## 2018-12-14 DIAGNOSIS — T827XXD Infection and inflammatory reaction due to other cardiac and vascular devices, implants and grafts, subsequent encounter: Secondary | ICD-10-CM | POA: Diagnosis not present

## 2018-12-14 NOTE — Telephone Encounter (Signed)
I agree

## 2018-12-15 DIAGNOSIS — M25561 Pain in right knee: Secondary | ICD-10-CM | POA: Diagnosis not present

## 2018-12-15 DIAGNOSIS — M545 Low back pain: Secondary | ICD-10-CM | POA: Diagnosis not present

## 2018-12-15 DIAGNOSIS — B9561 Methicillin susceptible Staphylococcus aureus infection as the cause of diseases classified elsewhere: Secondary | ICD-10-CM | POA: Diagnosis not present

## 2018-12-15 DIAGNOSIS — G8929 Other chronic pain: Secondary | ICD-10-CM | POA: Diagnosis not present

## 2018-12-15 DIAGNOSIS — M9933 Osseous stenosis of neural canal of lumbar region: Secondary | ICD-10-CM | POA: Diagnosis not present

## 2018-12-15 DIAGNOSIS — T827XXD Infection and inflammatory reaction due to other cardiac and vascular devices, implants and grafts, subsequent encounter: Secondary | ICD-10-CM | POA: Diagnosis not present

## 2018-12-16 LAB — DRUG SCREEN 10 W/CONF, WB
AMPHETAMINES, IA: NEGATIVE ng/mL
Barbiturates, IA: NEGATIVE ug/mL
Benzodiazepines, IA: NEGATIVE ng/mL
COCAINE/METABOLITE,IA: NEGATIVE ng/mL
Methadone, IA: NEGATIVE ng/mL
OPIATES, IA: NEGATIVE ng/mL
Oxycodones, IA: NEGATIVE ng/mL
Phencyclidine, IA: NEGATIVE ng/mL
Propoxyphene, IA: NEGATIVE ng/mL
THC (Marijuana) Mtb, IA: NEGATIVE ng/mL

## 2018-12-17 MED FILL — NOVOLOG FLEXPEN SYRINGE: 100 | 25 days supply | Qty: 15 | Fill #2 | Status: TO

## 2018-12-18 ENCOUNTER — Other Ambulatory Visit: Payer: Self-pay

## 2018-12-18 ENCOUNTER — Other Ambulatory Visit: Payer: Self-pay | Admitting: *Deleted

## 2018-12-18 ENCOUNTER — Telehealth: Payer: Self-pay | Admitting: *Deleted

## 2018-12-18 DIAGNOSIS — M545 Low back pain: Secondary | ICD-10-CM | POA: Diagnosis not present

## 2018-12-18 DIAGNOSIS — G8929 Other chronic pain: Secondary | ICD-10-CM | POA: Diagnosis not present

## 2018-12-18 DIAGNOSIS — M9933 Osseous stenosis of neural canal of lumbar region: Secondary | ICD-10-CM | POA: Diagnosis not present

## 2018-12-18 DIAGNOSIS — T827XXD Infection and inflammatory reaction due to other cardiac and vascular devices, implants and grafts, subsequent encounter: Secondary | ICD-10-CM | POA: Diagnosis not present

## 2018-12-18 DIAGNOSIS — G9520 Unspecified cord compression: Secondary | ICD-10-CM

## 2018-12-18 DIAGNOSIS — M25561 Pain in right knee: Secondary | ICD-10-CM | POA: Diagnosis not present

## 2018-12-18 DIAGNOSIS — B9561 Methicillin susceptible Staphylococcus aureus infection as the cause of diseases classified elsewhere: Secondary | ICD-10-CM | POA: Diagnosis not present

## 2018-12-18 MED ORDER — OXYCODONE-ACETAMINOPHEN 10-325 MG PO TABS: 1.0000 | ORAL_TABLET | Freq: Three times a day (TID) | ORAL | 0 refills | Status: DC | PRN

## 2018-12-18 NOTE — Telephone Encounter (Signed)
[facility] OT, ask for VO 2x week for 4 weeks, 1x week for 2 weeks for safety, adl's managing environment. Do you agree?

## 2018-12-18 NOTE — Telephone Encounter (Signed)
I agree with verbal order. Thank you.

## 2018-12-18 NOTE — Telephone Encounter (Signed)
Last rx written  11/20/18. Last OV  11/20/18. Next OV 3/12 in [place]. UDS 11/20/18.

## 2018-12-18 NOTE — Telephone Encounter (Signed)
oxyCODONE-acetaminophen (PERCOCET) 10-325 MG tablet   REFILL REQUEST @  [id number] - [place], [place] AT [place] [phone] (Phone) [phone] (Fax)

## 2018-12-19 DIAGNOSIS — G8929 Other chronic pain: Secondary | ICD-10-CM | POA: Diagnosis not present

## 2018-12-19 DIAGNOSIS — B9561 Methicillin susceptible Staphylococcus aureus infection as the cause of diseases classified elsewhere: Secondary | ICD-10-CM | POA: Diagnosis not present

## 2018-12-19 DIAGNOSIS — T827XXD Infection and inflammatory reaction due to other cardiac and vascular devices, implants and grafts, subsequent encounter: Secondary | ICD-10-CM | POA: Diagnosis not present

## 2018-12-19 DIAGNOSIS — M25561 Pain in right knee: Secondary | ICD-10-CM | POA: Diagnosis not present

## 2018-12-19 DIAGNOSIS — M545 Low back pain: Secondary | ICD-10-CM | POA: Diagnosis not present

## 2018-12-19 DIAGNOSIS — M9933 Osseous stenosis of neural canal of lumbar region: Secondary | ICD-10-CM | POA: Diagnosis not present

## 2018-12-20 ENCOUNTER — Ambulatory Visit: Payer: Self-pay

## 2018-12-20 DIAGNOSIS — G8929 Other chronic pain: Secondary | ICD-10-CM | POA: Diagnosis not present

## 2018-12-20 DIAGNOSIS — M9933 Osseous stenosis of neural canal of lumbar region: Secondary | ICD-10-CM | POA: Diagnosis not present

## 2018-12-20 DIAGNOSIS — B9561 Methicillin susceptible Staphylococcus aureus infection as the cause of diseases classified elsewhere: Secondary | ICD-10-CM | POA: Diagnosis not present

## 2018-12-20 DIAGNOSIS — M25561 Pain in right knee: Secondary | ICD-10-CM | POA: Diagnosis not present

## 2018-12-20 DIAGNOSIS — T827XXD Infection and inflammatory reaction due to other cardiac and vascular devices, implants and grafts, subsequent encounter: Secondary | ICD-10-CM | POA: Diagnosis not present

## 2018-12-20 DIAGNOSIS — M545 Low back pain: Secondary | ICD-10-CM | POA: Diagnosis not present

## 2018-12-20 LAB — BLOOD GAS, VENOUS

## 2018-12-25 ENCOUNTER — Ambulatory Visit: Payer: Self-pay

## 2018-12-26 DIAGNOSIS — M25561 Pain in right knee: Secondary | ICD-10-CM | POA: Diagnosis not present

## 2018-12-26 DIAGNOSIS — M9933 Osseous stenosis of neural canal of lumbar region: Secondary | ICD-10-CM | POA: Diagnosis not present

## 2018-12-26 DIAGNOSIS — B9561 Methicillin susceptible Staphylococcus aureus infection as the cause of diseases classified elsewhere: Secondary | ICD-10-CM | POA: Diagnosis not present

## 2018-12-26 DIAGNOSIS — M545 Low back pain: Secondary | ICD-10-CM | POA: Diagnosis not present

## 2018-12-26 DIAGNOSIS — G8929 Other chronic pain: Secondary | ICD-10-CM | POA: Diagnosis not present

## 2018-12-26 DIAGNOSIS — T827XXD Infection and inflammatory reaction due to other cardiac and vascular devices, implants and grafts, subsequent encounter: Secondary | ICD-10-CM | POA: Diagnosis not present

## 2018-12-27 DIAGNOSIS — T827XXD Infection and inflammatory reaction due to other cardiac and vascular devices, implants and grafts, subsequent encounter: Secondary | ICD-10-CM | POA: Diagnosis not present

## 2018-12-27 DIAGNOSIS — M9933 Osseous stenosis of neural canal of lumbar region: Secondary | ICD-10-CM | POA: Diagnosis not present

## 2018-12-27 DIAGNOSIS — M545 Low back pain: Secondary | ICD-10-CM | POA: Diagnosis not present

## 2018-12-27 DIAGNOSIS — G8929 Other chronic pain: Secondary | ICD-10-CM | POA: Diagnosis not present

## 2018-12-27 DIAGNOSIS — B9561 Methicillin susceptible Staphylococcus aureus infection as the cause of diseases classified elsewhere: Secondary | ICD-10-CM | POA: Diagnosis not present

## 2018-12-27 DIAGNOSIS — M25561 Pain in right knee: Secondary | ICD-10-CM | POA: Diagnosis not present

## 2018-12-28 ENCOUNTER — Telehealth: Payer: Self-pay

## 2018-12-28 DIAGNOSIS — T827XXD Infection and inflammatory reaction due to other cardiac and vascular devices, implants and grafts, subsequent encounter: Secondary | ICD-10-CM | POA: Diagnosis not present

## 2018-12-28 DIAGNOSIS — M25561 Pain in right knee: Secondary | ICD-10-CM | POA: Diagnosis not present

## 2018-12-28 DIAGNOSIS — G8929 Other chronic pain: Secondary | ICD-10-CM | POA: Diagnosis not present

## 2018-12-28 DIAGNOSIS — M545 Low back pain: Secondary | ICD-10-CM | POA: Diagnosis not present

## 2018-12-28 DIAGNOSIS — B9561 Methicillin susceptible Staphylococcus aureus infection as the cause of diseases classified elsewhere: Secondary | ICD-10-CM | POA: Diagnosis not present

## 2018-12-28 DIAGNOSIS — M9933 Osseous stenosis of neural canal of lumbar region: Secondary | ICD-10-CM | POA: Diagnosis not present

## 2018-12-28 NOTE — Telephone Encounter (Signed)
Seth [name] with [facility] want to informed the doctor, pt missed OT visit 12/28/2018.

## 2018-12-29 NOTE — Telephone Encounter (Signed)
Thank you :)

## 2019-01-01 DIAGNOSIS — T827XXD Infection and inflammatory reaction due to other cardiac and vascular devices, implants and grafts, subsequent encounter: Secondary | ICD-10-CM | POA: Diagnosis not present

## 2019-01-01 DIAGNOSIS — G8929 Other chronic pain: Secondary | ICD-10-CM | POA: Diagnosis not present

## 2019-01-01 DIAGNOSIS — M9933 Osseous stenosis of neural canal of lumbar region: Secondary | ICD-10-CM | POA: Diagnosis not present

## 2019-01-01 DIAGNOSIS — M25561 Pain in right knee: Secondary | ICD-10-CM | POA: Diagnosis not present

## 2019-01-01 DIAGNOSIS — B9561 Methicillin susceptible Staphylococcus aureus infection as the cause of diseases classified elsewhere: Secondary | ICD-10-CM | POA: Diagnosis not present

## 2019-01-01 DIAGNOSIS — M545 Low back pain: Secondary | ICD-10-CM | POA: Diagnosis not present

## 2019-01-02 DIAGNOSIS — M25561 Pain in right knee: Secondary | ICD-10-CM | POA: Diagnosis not present

## 2019-01-02 DIAGNOSIS — M9933 Osseous stenosis of neural canal of lumbar region: Secondary | ICD-10-CM | POA: Diagnosis not present

## 2019-01-02 DIAGNOSIS — G8929 Other chronic pain: Secondary | ICD-10-CM | POA: Diagnosis not present

## 2019-01-02 DIAGNOSIS — B9561 Methicillin susceptible Staphylococcus aureus infection as the cause of diseases classified elsewhere: Secondary | ICD-10-CM | POA: Diagnosis not present

## 2019-01-02 DIAGNOSIS — T827XXD Infection and inflammatory reaction due to other cardiac and vascular devices, implants and grafts, subsequent encounter: Secondary | ICD-10-CM | POA: Diagnosis not present

## 2019-01-02 DIAGNOSIS — M545 Low back pain: Secondary | ICD-10-CM | POA: Diagnosis not present

## 2019-01-03 DIAGNOSIS — G8929 Other chronic pain: Secondary | ICD-10-CM | POA: Diagnosis not present

## 2019-01-03 DIAGNOSIS — M545 Low back pain: Secondary | ICD-10-CM | POA: Diagnosis not present

## 2019-01-03 DIAGNOSIS — M9933 Osseous stenosis of neural canal of lumbar region: Secondary | ICD-10-CM | POA: Diagnosis not present

## 2019-01-03 DIAGNOSIS — M25561 Pain in right knee: Secondary | ICD-10-CM | POA: Diagnosis not present

## 2019-01-03 DIAGNOSIS — B9561 Methicillin susceptible Staphylococcus aureus infection as the cause of diseases classified elsewhere: Secondary | ICD-10-CM | POA: Diagnosis not present

## 2019-01-03 DIAGNOSIS — T827XXD Infection and inflammatory reaction due to other cardiac and vascular devices, implants and grafts, subsequent encounter: Secondary | ICD-10-CM | POA: Diagnosis not present

## 2019-01-07 ENCOUNTER — Telehealth: Payer: Self-pay

## 2019-01-07 ENCOUNTER — Telehealth: Payer: Self-pay | Admitting: Internal Medicine

## 2019-01-07 NOTE — Telephone Encounter (Signed)
Per Sharyn Lull from [facility] requesting VO; [phone]

## 2019-01-07 NOTE — Telephone Encounter (Signed)
Returned call to [place], [place] with [facility]. Requesting to d/c patient early from, [facility] OT as he as met all his goals. Verbal auth given. Will route to PCP for agreement/denial. Hubbard Hartshorn, RN, BSN

## 2019-01-07 NOTE — Telephone Encounter (Signed)
I agree with order.

## 2019-01-07 NOTE — Telephone Encounter (Signed)
Seth [name] with [facility] requesting VO for PT. Please call back.

## 2019-01-08 DIAGNOSIS — M25561 Pain in right knee: Secondary | ICD-10-CM | POA: Diagnosis not present

## 2019-01-08 DIAGNOSIS — B9561 Methicillin susceptible Staphylococcus aureus infection as the cause of diseases classified elsewhere: Secondary | ICD-10-CM | POA: Diagnosis not present

## 2019-01-08 DIAGNOSIS — G8929 Other chronic pain: Secondary | ICD-10-CM | POA: Diagnosis not present

## 2019-01-08 DIAGNOSIS — Z992 Dependence on renal dialysis: Secondary | ICD-10-CM | POA: Diagnosis not present

## 2019-01-08 DIAGNOSIS — T827XXD Infection and inflammatory reaction due to other cardiac and vascular devices, implants and grafts, subsequent encounter: Secondary | ICD-10-CM | POA: Diagnosis not present

## 2019-01-08 DIAGNOSIS — M545 Low back pain: Secondary | ICD-10-CM | POA: Diagnosis not present

## 2019-01-08 DIAGNOSIS — N186 End stage renal disease: Secondary | ICD-10-CM | POA: Diagnosis not present

## 2019-01-08 DIAGNOSIS — M9933 Osseous stenosis of neural canal of lumbar region: Secondary | ICD-10-CM | POA: Diagnosis not present

## 2019-01-08 NOTE — Telephone Encounter (Signed)
Lowella Fairy, PT [facility] - requesting verbal order to "Extend PT to twice a week x 2 more weeks"  To continue strength improvement so he can do his ADL's. VO given - if not appropriate, let me know.

## 2019-01-09 DIAGNOSIS — T827XXD Infection and inflammatory reaction due to other cardiac and vascular devices, implants and grafts, subsequent encounter: Secondary | ICD-10-CM | POA: Diagnosis not present

## 2019-01-09 DIAGNOSIS — B9561 Methicillin susceptible Staphylococcus aureus infection as the cause of diseases classified elsewhere: Secondary | ICD-10-CM | POA: Diagnosis not present

## 2019-01-09 DIAGNOSIS — G8929 Other chronic pain: Secondary | ICD-10-CM | POA: Diagnosis not present

## 2019-01-09 DIAGNOSIS — M9933 Osseous stenosis of neural canal of lumbar region: Secondary | ICD-10-CM | POA: Diagnosis not present

## 2019-01-09 DIAGNOSIS — M545 Low back pain: Secondary | ICD-10-CM | POA: Diagnosis not present

## 2019-01-09 DIAGNOSIS — M25561 Pain in right knee: Secondary | ICD-10-CM | POA: Diagnosis not present

## 2019-01-09 NOTE — Telephone Encounter (Signed)
I agree with the [facility] order. Thank you.

## 2019-01-10 DIAGNOSIS — Z794 Long term (current) use of insulin: Secondary | ICD-10-CM | POA: Diagnosis not present

## 2019-01-10 DIAGNOSIS — Z7982 Long term (current) use of aspirin: Secondary | ICD-10-CM | POA: Diagnosis not present

## 2019-01-10 DIAGNOSIS — B9561 Methicillin susceptible Staphylococcus aureus infection as the cause of diseases classified elsewhere: Secondary | ICD-10-CM | POA: Diagnosis not present

## 2019-01-10 DIAGNOSIS — Z792 Long term (current) use of antibiotics: Secondary | ICD-10-CM | POA: Diagnosis not present

## 2019-01-10 DIAGNOSIS — M545 Low back pain: Secondary | ICD-10-CM | POA: Diagnosis not present

## 2019-01-10 DIAGNOSIS — T827XXD Infection and inflammatory reaction due to other cardiac and vascular devices, implants and grafts, subsequent encounter: Secondary | ICD-10-CM | POA: Diagnosis not present

## 2019-01-10 DIAGNOSIS — G8929 Other chronic pain: Secondary | ICD-10-CM | POA: Diagnosis not present

## 2019-01-10 DIAGNOSIS — E1122 Type 2 diabetes mellitus with diabetic chronic kidney disease: Secondary | ICD-10-CM | POA: Diagnosis not present

## 2019-01-10 DIAGNOSIS — Z79891 Long term (current) use of opiate analgesic: Secondary | ICD-10-CM | POA: Diagnosis not present

## 2019-01-10 DIAGNOSIS — M9933 Osseous stenosis of neural canal of lumbar region: Secondary | ICD-10-CM | POA: Diagnosis not present

## 2019-01-10 DIAGNOSIS — I120 Hypertensive chronic kidney disease with stage 5 chronic kidney disease or end stage renal disease: Secondary | ICD-10-CM | POA: Diagnosis not present

## 2019-01-10 DIAGNOSIS — Z992 Dependence on renal dialysis: Secondary | ICD-10-CM | POA: Diagnosis not present

## 2019-01-10 DIAGNOSIS — M25561 Pain in right knee: Secondary | ICD-10-CM | POA: Diagnosis not present

## 2019-01-10 DIAGNOSIS — G4733 Obstructive sleep apnea (adult) (pediatric): Secondary | ICD-10-CM | POA: Diagnosis not present

## 2019-01-10 DIAGNOSIS — N186 End stage renal disease: Secondary | ICD-10-CM | POA: Diagnosis not present

## 2019-01-10 DIAGNOSIS — Z6841 Body Mass Index (BMI) 40.0 and over, adult: Secondary | ICD-10-CM | POA: Diagnosis not present

## 2019-01-15 ENCOUNTER — Telehealth: Payer: Self-pay | Admitting: *Deleted

## 2019-01-15 NOTE — Telephone Encounter (Signed)
Seth [name] [facility] PT missed visit due to family illness of pt

## 2019-01-16 ENCOUNTER — Other Ambulatory Visit: Payer: Self-pay

## 2019-01-16 DIAGNOSIS — G9520 Unspecified cord compression: Secondary | ICD-10-CM

## 2019-01-16 DIAGNOSIS — M9933 Osseous stenosis of neural canal of lumbar region: Secondary | ICD-10-CM | POA: Diagnosis not present

## 2019-01-16 DIAGNOSIS — B9561 Methicillin susceptible Staphylococcus aureus infection as the cause of diseases classified elsewhere: Secondary | ICD-10-CM | POA: Diagnosis not present

## 2019-01-16 DIAGNOSIS — T827XXD Infection and inflammatory reaction due to other cardiac and vascular devices, implants and grafts, subsequent encounter: Secondary | ICD-10-CM | POA: Diagnosis not present

## 2019-01-16 DIAGNOSIS — M25561 Pain in right knee: Secondary | ICD-10-CM | POA: Diagnosis not present

## 2019-01-16 DIAGNOSIS — M545 Low back pain: Secondary | ICD-10-CM | POA: Diagnosis not present

## 2019-01-16 DIAGNOSIS — G8929 Other chronic pain: Secondary | ICD-10-CM | POA: Diagnosis not present

## 2019-01-16 MED ORDER — OXYCODONE-ACETAMINOPHEN 10-325 MG PO TABS: 1.0000 | ORAL_TABLET | Freq: Three times a day (TID) | ORAL | 0 refills | Status: DC | PRN

## 2019-01-16 NOTE — Telephone Encounter (Signed)
oxyCODONE-acetaminophen (PERCOCET) 10-325 MG tablet   Refill request @  [facility] [place], [place] AT [place] [phone] (Phone) [phone] (Fax)

## 2019-01-23 ENCOUNTER — Other Ambulatory Visit: Payer: Self-pay | Admitting: *Deleted

## 2019-01-23 DIAGNOSIS — E785 Hyperlipidemia, unspecified: Principal | ICD-10-CM

## 2019-01-23 DIAGNOSIS — E1169 Type 2 diabetes mellitus with other specified complication: Secondary | ICD-10-CM

## 2019-01-23 MED ORDER — AMLODIPINE BESYLATE 10 MG PO TABS: 10.0000 mg | ORAL_TABLET | Freq: Every day | ORAL | 1 refills | Status: DC

## 2019-01-23 MED ORDER — ATORVASTATIN CALCIUM 40 MG PO TABS: 40.0000 mg | ORAL_TABLET | Freq: Every day | ORAL | 3 refills | Status: DC

## 2019-01-23 MED FILL — ATORVASTATIN 40 MG TABLET: 40 | 90 days supply | Qty: 90 | Fill #0

## 2019-01-23 MED FILL — AMLODIPINE BESYLATE 10 MG T: 10 | 90 days supply | Qty: 90 | Fill #0

## 2019-02-07 DIAGNOSIS — N186 End stage renal disease: Secondary | ICD-10-CM | POA: Diagnosis not present

## 2019-02-07 DIAGNOSIS — Z992 Dependence on renal dialysis: Secondary | ICD-10-CM | POA: Diagnosis not present

## 2019-02-13 ENCOUNTER — Other Ambulatory Visit: Payer: Self-pay | Admitting: Internal Medicine

## 2019-02-13 DIAGNOSIS — G9520 Unspecified cord compression: Secondary | ICD-10-CM

## 2019-02-13 MED ORDER — OXYCODONE-ACETAMINOPHEN 10-325 MG PO TABS: 1.0000 | ORAL_TABLET | Freq: Three times a day (TID) | ORAL | 0 refills | Status: DC | PRN

## 2019-02-13 NOTE — Telephone Encounter (Signed)
Last rx written  01/16/19. Last OV  11/20/18. Next OV - no f/u appt  UDS  12/12/18.

## 2019-02-13 NOTE — Telephone Encounter (Signed)
Needs refill on oxyCODONE-acetaminophen (PERCOCET) 10-325 MG tablet ;pt [place], [place]

## 2019-02-15 ENCOUNTER — Other Ambulatory Visit: Payer: Self-pay | Admitting: Internal Medicine

## 2019-02-15 NOTE — Telephone Encounter (Signed)
REFILL REQUEST oxyCODONE-acetaminophen (PERCOCET) 10-325 MG tablet AT  [facility] #[id number] - [place], [place] AT [place] [phone] (Phone) [phone] (Fax)

## 2019-02-15 NOTE — Telephone Encounter (Signed)
Have called pharm, script can be filled 5/9 and he will be notified

## 2019-02-21 ENCOUNTER — Other Ambulatory Visit: Payer: Self-pay

## 2019-02-21 DIAGNOSIS — N186 End stage renal disease: Secondary | ICD-10-CM

## 2019-02-21 DIAGNOSIS — Z992 Dependence on renal dialysis: Secondary | ICD-10-CM

## 2019-02-22 MED FILL — NOVOLOG FLEXPEN SYRINGE: 100 | 25 days supply | Qty: 15 | Fill #0

## 2019-02-28 ENCOUNTER — Telehealth (HOSPITAL_COMMUNITY): Payer: Self-pay | Admitting: Rehabilitation

## 2019-02-28 NOTE — Telephone Encounter (Signed)
The above patient or their representative was contacted and gave the following answers to these questions:         Do you have any of the following symptoms? No  Fever                    Cough                   Shortness of breath  Do  you have any of the following other symptoms? No   muscle pain         vomiting,        diarrhea        rash         weakness        red eye        abdominal pain         bruising          bruising or bleeding              joint pain           severe headache    Have you been in contact with someone who was or has been sick in the past 2 weeks? No  Yes                 Unsure                         Unable to assess   Does the person that you were in contact with have any of the following symptoms?   Cough         shortness of breath           muscle pain         vomiting,            diarrhea            rash            weakness           fever            red eye           abdominal pain           bruising  or  bleeding                joint pain                severe headache               Have you  or someone you have been in contact with traveled internationally in th last month? No        If yes, which countries?   Have you  or someone you have been in contact with traveled outside [place] in th last month? No         If yes, which state and city?   COMMENTS OR ACTION PLAN FOR THIS PATIENT:          

## 2019-03-01 ENCOUNTER — Other Ambulatory Visit: Payer: Self-pay | Admitting: *Deleted

## 2019-03-01 ENCOUNTER — Other Ambulatory Visit: Payer: Self-pay

## 2019-03-01 ENCOUNTER — Ambulatory Visit (HOSPITAL_COMMUNITY)
Admission: RE | Admit: 2019-03-01 | Discharge: 2019-03-01 | Disposition: A | Discharge status: Home / Self Care | Payer: Medicare Other | Source: Ambulatory Visit | Attending: Vascular Surgery | Admitting: Vascular Surgery

## 2019-03-01 ENCOUNTER — Encounter: Payer: Self-pay | Admitting: Vascular Surgery

## 2019-03-01 ENCOUNTER — Ambulatory Visit (INDEPENDENT_AMBULATORY_CARE_PROVIDER_SITE_OTHER): Payer: Medicare Other | Admitting: Vascular Surgery

## 2019-03-01 VITALS — BP 147/85 | HR 74 | Temp 98.8°F | Resp 20 | Ht 67.0 in | Wt 311.3 lb

## 2019-03-01 DIAGNOSIS — N186 End stage renal disease: Secondary | ICD-10-CM | POA: Diagnosis not present

## 2019-03-01 DIAGNOSIS — Z992 Dependence on renal dialysis: Secondary | ICD-10-CM | POA: Insufficient documentation

## 2019-03-01 NOTE — Progress Notes (Signed)
Seth [name] will go to [facility] for Covid Testing on Friday 03-08-2019 in preparation for his venograms at [facility] lab on Monday 03-11-2019 with Dr. Donzetta Matters

## 2019-03-01 NOTE — Progress Notes (Signed)
[name], male   DOB: [birth year], 53 y.o.   MRN: [id number]  Reason for Consult: Follow-up   Referred by Seth Sage, MD  Subjective:     HPI:  Seth [name] is a 53 y.o. male had a previous radiocephalic fistula on the right that was revised with Artegraft subsequently fistulogram when this was not workig and was subsequently set up for left arm fistula which was brachiobasilic.  That time it was thought he could then be revised with above the elbow fistula.  He re-presented with MSSA bacteremia from a catheter.  Catheter was replaced.  He is now been out of the hospital with a working left IJ catheter.  Has no further infectious concerns.  He is wondering if possibly his right arm fistula would be usable in the future.  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/2015   ESRD (end stage renal disease) on dialysis [facility])    [facility] M[facility]F   Hypertension    RETINAL DETACHMENT, HX OF 06/20/2007   Qualifier: Diagnosis of  By: Vinetta Bergamo RN, [name]     Sleep apnea    USES CPAP   Family History  Problem Relation Age of Onset   Diabetes Mother    Cancer Mother    Diabetes Sister    Diabetes Brother    Past Surgical History:  Procedure Laterality Date   A/V FISTULAGRAM N/A 10/18/2018   Procedure: A/V FISTULAGRAM;  Surgeon: Marty Heck, MD;  Location: [facility];  Service: Cardiovascular;  Laterality: N/A;   AV FISTULA PLACEMENT  05/03/2012   Procedure: ARTERIOVENOUS (AV) FISTULA CREATION;  Surgeon: Mal [name], MD;  Location: [place];  Service: Vascular;  Laterality: Right;   AV FISTULA PLACEMENT Left 10/23/2018   Procedure: ARTERIOVENOUS (AV) FISTULA CREATION;  Surgeon: Waynetta Sandy, MD;  Location: [place];  Service: Vascular;  Laterality: Left;   INSERTION OF DIALYSIS CATHETER Left 12/03/2018   Procedure: INSERTION OF  DIALYSIS CATHETER;  Surgeon: Marty Heck, MD;  Location: [place];  Service: Vascular;  Laterality: Left;   ORIF MANDIBULAR FRACTURE N/A 11/10/2017   Procedure: OPEN REDUCTION INTERNAL FIXATION (ORIF) MANDIBULAR FRACTURE POSSIBLE MAXILLARY MANDIBULAR FIXATION;  Surgeon: Jodi Marble, MD;  Location: [place];  Service: ENT;  Laterality: N/A;   REVISON OF ARTERIOVENOUS FISTULA Right 05/31/2018   Procedure: REVISION OF ARTERIOVENOUS FISTULA  RIGHT ARM WITH INTERPOSITION ARTEGRAFT 6MM X 16CM GRAFT;  Surgeon: Waynetta Sandy, MD;  Location: [place];  Service: Vascular;  Laterality: Right;   TEE WITHOUT CARDIOVERSION N/A 07/10/2015   Procedure: TRANSESOPHAGEAL ECHOCARDIOGRAM (TEE);  Surgeon: Josue Hector, MD;  Location: [facility];  Service: Cardiovascular;  Laterality: N/A;   TEE WITHOUT CARDIOVERSION N/A 12/07/2018   Procedure: TRANSESOPHAGEAL ECHOCARDIOGRAM (TEE);  Surgeon: Acie Fredrickson Wonda Cheng, MD;  Location: [facility];  Service: Cardiovascular;  Laterality: N/A;   tracheostomy removal     TRACHEOSTOMY TUBE PLACEMENT N/A 08/20/2013   Procedure: TRACHEOSTOMY Revision;  Surgeon: Melida Quitter, MD;  Location: [place];  Service: ENT;  Laterality: N/A;    Short Social History:  Social History   Tobacco Use   Smoking status: Never Smoker   Smokeless tobacco: Never Used  Substance Use Topics   Alcohol use: No    Frequency: Never    No Known Allergies  Current Outpatient Medications  Medication Sig  Dispense Refill   amLODipine (NORVASC) 10 MG tablet Take 1 tablet (10 mg total) by mouth daily. 90 tablet 1   aspirin EC 81 MG tablet Take 81 mg by mouth daily.     atorvastatin (LIPITOR) 40 MG tablet Take 1 tablet (40 mg total) by mouth daily at 6 PM. 90 tablet 3   AURYXIA 1 GM 210 MG(Fe) tablet Take 630 mg by mouth See admin instructions. Take 630 mg 3 times daily with each meal and 420 mg with each snack     diclofenac sodium (VOLTAREN) 1 % GEL Apply 4 g topically 4 (four)  times daily. (Patient taking differently: Apply 4 g topically daily as needed (knee pain). ) 100 g 2   insulin aspart (NOVOLOG FLEXPEN) 100 UNIT/ML FlexPen Inject 0-20 Units into the skin 3 (three) times daily with meals. CBG < 70: drink orange juice and recheck glucose or use glucose tabs. (Patient taking differently: Inject 5-15 Units into the skin 3 (three) times daily with meals. CBG < 70: drink orange juice and recheck glucose or use glucose tabs.) 15 mL PRN   oxyCODONE-acetaminophen (PERCOCET) 10-325 MG tablet Take 1 tablet by mouth 3 (three) times daily as needed for pain. Please refill 30 days after last RX fill. 90 tablet 0   triamcinolone cream (KENALOG) 0.1 % Apply 1 application topically 2 (two) times daily. (Patient taking differently: Apply 1 application topically 2 (two) times daily as needed (rash). ) 45 g 0   No current facility-administered medications for this visit.     Review of Systems  Constitutional:  Constitutional negative. HENT: HENT negative.  Eyes: Eyes negative.  Respiratory: Respiratory negative.  Cardiovascular: Cardiovascular negative.  GI: Gastrointestinal negative.  Musculoskeletal: Musculoskeletal negative.  Skin: Skin negative.  Neurological: Neurological negative. Hematologic: Hematologic/lymphatic negative.  Psychiatric: Psychiatric negative.        Objective:  Objective   Vitals:   03/01/19 1150  BP: (!) 147/85  Pulse: 74  Resp: 20  Temp: 98.8 F (37.1 C)  SpO2: 94%  Weight: (!) 311 lb 4.8 oz (141.2 kg)  Height: 5\' 7"  (1.702 m)   Body mass index is 48.76 kg/m.  Physical Exam Eyes:     Pupils: Pupils are equal, round, and reactive to light.  Cardiovascular:     Rate and Rhythm: Normal rate.     Pulses:          Radial pulses are 2+ on the right side and 2+ on the left side.  Pulmonary:     Effort: Pulmonary effort is normal.  Musculoskeletal: Normal range of motion.        General: No swelling.     Comments: There is a  strong palpable thrill in the right forearm.  I cannot feel any palpable thrill in the left upper arm.  Skin:    General: Skin is warm and dry.  Neurological:     Mental Status: He is alert.  Psychiatric:        Mood and Affect: Mood normal.        Behavior: Behavior normal.        Thought Content: Thought content normal.        Judgment: Judgment normal.     Data: I have independently interpreted his left upper arm fistula duplex.  There is a stenosis noted at the anastomosis the diameter is up to 0.71 cm proximally flow volumes are down to 287.   Assessment/Plan:     53 year old male with end-stage renal  disease currently on dialysis via left IJ catheter.  Appears that his basilic vein fistula on the left is maturing but he also has a fistula on the right which may be more mature given that I cannot feel a thrill in the left upper arm.  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  After venography he can either get a left second stage basilic vein fistula or graft if that vein does not appear to be maturing or quite possibly we could perform a right-sided second stage basilic vein fistula as his previous radiocephalic fistula did drain to the basilic vein and it should be quite mature.  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  Patient is on board with any procedure that gets him access and can get his catheter out soon.  We will get him set up for the venography with possible fistulogram on the right and possible intervention on the right.    Waynetta Sandy MD [facility]

## 2019-03-08 ENCOUNTER — Other Ambulatory Visit (HOSPITAL_COMMUNITY): Payer: Medicare Other

## 2019-03-10 DIAGNOSIS — Z992 Dependence on renal dialysis: Secondary | ICD-10-CM | POA: Diagnosis not present

## 2019-03-10 DIAGNOSIS — N186 End stage renal disease: Secondary | ICD-10-CM | POA: Diagnosis not present

## 2019-03-11 ENCOUNTER — Other Ambulatory Visit (HOSPITAL_COMMUNITY): Payer: Medicare Other

## 2019-03-15 ENCOUNTER — Other Ambulatory Visit: Payer: Self-pay

## 2019-03-15 ENCOUNTER — Other Ambulatory Visit (HOSPITAL_COMMUNITY)
Admission: RE | Admit: 2019-03-15 | Discharge: 2019-03-15 | Disposition: A | Discharge status: Home / Self Care | Payer: Medicare Other | Source: Ambulatory Visit | Attending: Vascular Surgery | Admitting: Vascular Surgery

## 2019-03-15 DIAGNOSIS — Z01812 Encounter for preprocedural laboratory examination: Secondary | ICD-10-CM | POA: Diagnosis not present

## 2019-03-15 DIAGNOSIS — Z1159 Encounter for screening for other viral diseases: Secondary | ICD-10-CM | POA: Diagnosis not present

## 2019-03-16 LAB — NOVEL CORONAVIRUS, NAA (HOSP ORDER, SEND-OUT TO REF LAB; TAT 18-24 HRS): SARS-CoV-2, NAA: NOT DETECTED

## 2019-03-18 ENCOUNTER — Other Ambulatory Visit: Payer: Self-pay | Admitting: Internal Medicine

## 2019-03-18 ENCOUNTER — Ambulatory Visit (HOSPITAL_COMMUNITY)
Admission: RE | Admit: 2019-03-18 | Discharge: 2019-03-18 | Disposition: A | Discharge status: Home / Self Care | Payer: Medicare Other | Attending: Vascular Surgery | Admitting: Vascular Surgery

## 2019-03-18 ENCOUNTER — Encounter (HOSPITAL_COMMUNITY)
Admission: RE | Disposition: A | Discharge status: Home / Self Care | Payer: Self-pay | Source: Home / Self Care | Attending: Vascular Surgery

## 2019-03-18 DIAGNOSIS — G9520 Unspecified cord compression: Secondary | ICD-10-CM

## 2019-03-18 SURGERY — UPPER EXTREMITY VENOGRAPHY
Anesthesia: LOCAL

## 2019-03-18 MED ORDER — SODIUM CHLORIDE 0.9 % IV SOLN: 250.0000 mL | INTRAVENOUS | Status: DC | PRN

## 2019-03-18 MED ORDER — SODIUM CHLORIDE 0.9% FLUSH: 3.0000 mL | INTRAVENOUS | Status: DC | PRN

## 2019-03-18 MED ORDER — SODIUM CHLORIDE 0.9% FLUSH: 3.0000 mL | Freq: Two times a day (BID) | INTRAVENOUS | Status: DC

## 2019-03-18 NOTE — Telephone Encounter (Signed)
Needs refill on oxyCODONE-acetaminophen (PERCOCET) 10-325 MG tablet ;pt contact [place], [place]

## 2019-03-18 NOTE — Progress Notes (Signed)
Pt states he doesn't feel right have procedure today. Request to have procedure on a day that is not a dialysis day. Rennis Harding, Rn called and informed. States she will check to see if he can come tomorrow.

## 2019-03-19 MED ORDER — OXYCODONE-ACETAMINOPHEN 10-325 MG PO TABS: 1.0000 | ORAL_TABLET | Freq: Three times a day (TID) | ORAL | 0 refills | Status: DC | PRN

## 2019-03-19 NOTE — H&P (Signed)
   Patient presented for bilateral upper extremity venogram with possible right-sided fistulogram.  He was unfortunately not feeling well having just completed dialysis and requested to reschedule his procedure.  He was subsequently discharged.  Brandon C. Donzetta Matters, MD [facility] [place] Office: [phone] Pager: [phone]

## 2019-03-20 ENCOUNTER — Other Ambulatory Visit: Payer: Self-pay

## 2019-03-20 DIAGNOSIS — T82590A Other mechanical complication of surgically created arteriovenous fistula, initial encounter: Secondary | ICD-10-CM

## 2019-03-22 ENCOUNTER — Encounter (HOSPITAL_COMMUNITY): Payer: Self-pay

## 2019-03-22 ENCOUNTER — Ambulatory Visit: Payer: Medicare Other | Admitting: Vascular Surgery

## 2019-03-22 NOTE — Progress Notes (Signed)
Spoke to patient about Covid 19 screen prior to procedure on 6/16. Patient stated he had the Covid 19 test before and it was painful and refuses to have the test again. Patient stated he is going to call his Dr. And cancel procedure.

## 2019-03-25 ENCOUNTER — Other Ambulatory Visit (HOSPITAL_COMMUNITY)
Admission: RE | Admit: 2019-03-25 | Discharge: 2019-03-25 | Disposition: A | Discharge status: Home / Self Care | Payer: Medicare Other | Source: Ambulatory Visit | Attending: Surgery | Admitting: Surgery

## 2019-03-25 DIAGNOSIS — Z1159 Encounter for screening for other viral diseases: Secondary | ICD-10-CM | POA: Diagnosis not present

## 2019-03-25 LAB — SARS CORONAVIRUS 2 BY RT PCR (HOSPITAL ORDER, PERFORMED IN ~~LOC~~ HOSPITAL LAB): SARS Coronavirus 2: NEGATIVE

## 2019-03-26 ENCOUNTER — Encounter (HOSPITAL_COMMUNITY): Admission: RE | Payer: Self-pay | Source: Home / Self Care

## 2019-03-26 ENCOUNTER — Ambulatory Visit (HOSPITAL_COMMUNITY): Admission: RE | Admit: 2019-03-26 | Payer: Medicare Other | Source: Home / Self Care | Admitting: Surgery

## 2019-03-26 ENCOUNTER — Other Ambulatory Visit: Payer: Self-pay

## 2019-03-26 SURGERY — UPPER EXTREMITY VENOGRAPHY
Anesthesia: LOCAL | Laterality: Bilateral

## 2019-03-28 ENCOUNTER — Other Ambulatory Visit: Payer: Self-pay | Admitting: *Deleted

## 2019-03-28 ENCOUNTER — Ambulatory Visit (HOSPITAL_COMMUNITY)
Admission: RE | Admit: 2019-03-28 | Discharge: 2019-03-28 | Disposition: A | Discharge status: Home / Self Care | Payer: Medicare Other | Attending: Vascular Surgery | Admitting: Vascular Surgery

## 2019-03-28 ENCOUNTER — Ambulatory Visit (HOSPITAL_COMMUNITY)
Admission: RE | Disposition: A | Discharge status: Home / Self Care | Payer: Self-pay | Source: Home / Self Care | Attending: Vascular Surgery

## 2019-03-28 ENCOUNTER — Encounter (HOSPITAL_COMMUNITY): Payer: Self-pay | Admitting: Vascular Surgery

## 2019-03-28 ENCOUNTER — Other Ambulatory Visit: Payer: Self-pay

## 2019-03-28 DIAGNOSIS — E1122 Type 2 diabetes mellitus with diabetic chronic kidney disease: Secondary | ICD-10-CM | POA: Insufficient documentation

## 2019-03-28 DIAGNOSIS — T82510A Breakdown (mechanical) of surgically created arteriovenous fistula, initial encounter: Secondary | ICD-10-CM | POA: Insufficient documentation

## 2019-03-28 DIAGNOSIS — Z79899 Other long term (current) drug therapy: Secondary | ICD-10-CM | POA: Insufficient documentation

## 2019-03-28 DIAGNOSIS — Z992 Dependence on renal dialysis: Secondary | ICD-10-CM | POA: Diagnosis not present

## 2019-03-28 DIAGNOSIS — Z794 Long term (current) use of insulin: Secondary | ICD-10-CM | POA: Insufficient documentation

## 2019-03-28 DIAGNOSIS — I120 Hypertensive chronic kidney disease with stage 5 chronic kidney disease or end stage renal disease: Secondary | ICD-10-CM | POA: Insufficient documentation

## 2019-03-28 DIAGNOSIS — N186 End stage renal disease: Secondary | ICD-10-CM | POA: Diagnosis not present

## 2019-03-28 DIAGNOSIS — Z791 Long term (current) use of non-steroidal anti-inflammatories (NSAID): Secondary | ICD-10-CM | POA: Insufficient documentation

## 2019-03-28 DIAGNOSIS — G4730 Sleep apnea, unspecified: Secondary | ICD-10-CM | POA: Diagnosis not present

## 2019-03-28 DIAGNOSIS — T82898A Other specified complication of vascular prosthetic devices, implants and grafts, initial encounter: Secondary | ICD-10-CM | POA: Diagnosis not present

## 2019-03-28 DIAGNOSIS — T82590A Other mechanical complication of surgically created arteriovenous fistula, initial encounter: Secondary | ICD-10-CM

## 2019-03-28 DIAGNOSIS — Y841 Kidney dialysis as the cause of abnormal reaction of the patient, or of later complication, without mention of misadventure at the time of the procedure: Secondary | ICD-10-CM | POA: Diagnosis not present

## 2019-03-28 DIAGNOSIS — N185 Chronic kidney disease, stage 5: Secondary | ICD-10-CM | POA: Diagnosis not present

## 2019-03-28 DIAGNOSIS — Z7982 Long term (current) use of aspirin: Secondary | ICD-10-CM | POA: Insufficient documentation

## 2019-03-28 HISTORY — PX: A/V FISTULAGRAM: CATH118298

## 2019-03-28 HISTORY — PX: UPPER EXTREMITY VENOGRAPHY: CATH118272

## 2019-03-28 LAB — POCT I-STAT, CHEM 8
BUN: 98 mg/dL — ABNORMAL HIGH (ref 6–20)
Calcium, Ion: 1.03 mmol/L — ABNORMAL LOW (ref 1.15–1.40)
Chloride: 96 mmol/L — ABNORMAL LOW (ref 98–111)
Creatinine, Ser: 11 mg/dL — ABNORMAL HIGH (ref 0.61–1.24)
Glucose, Bld: 187 mg/dL — ABNORMAL HIGH (ref 70–99)
HCT: 46 % (ref 39.0–52.0)
Hemoglobin: 15.6 g/dL (ref 13.0–17.0)
Potassium: 6.8 mmol/L (ref 3.5–5.1)
Sodium: 133 mmol/L — ABNORMAL LOW (ref 135–145)
TCO2: 29 mmol/L (ref 22–32)

## 2019-03-28 LAB — POCT I-STAT EG7
Acid-base deficit: 4 mmol/L — ABNORMAL HIGH (ref 0.0–2.0)
Bicarbonate: 24 mmol/L (ref 20.0–28.0)
Calcium, Ion: 1.05 mmol/L — ABNORMAL LOW (ref 1.15–1.40)
HCT: 43 % (ref 39.0–52.0)
Hemoglobin: 14.6 g/dL (ref 13.0–17.0)
O2 Saturation: 89 %
Potassium: 5.8 mmol/L — ABNORMAL HIGH (ref 3.5–5.1)
Sodium: 133 mmol/L — ABNORMAL LOW (ref 135–145)
TCO2: 26 mmol/L (ref 22–32)
pCO2, Ven: 52 mmHg (ref 44.0–60.0)
pH, Ven: 7.271 (ref 7.250–7.430)
pO2, Ven: 64 mmHg — ABNORMAL HIGH (ref 32.0–45.0)

## 2019-03-28 LAB — POTASSIUM: Potassium: 6.4 mmol/L (ref 3.5–5.1)

## 2019-03-28 SURGERY — A/V FISTULAGRAM
Anesthesia: LOCAL

## 2019-03-28 MED ORDER — HEPARIN (PORCINE) IN NACL 1000-0.9 UT/500ML-% IV SOLN
INTRAVENOUS | Status: DC | PRN
  Administered 2019-03-28: 500 mL

## 2019-03-28 MED ORDER — SODIUM CHLORIDE 0.9% FLUSH: 3.0000 mL | Freq: Two times a day (BID) | INTRAVENOUS | Status: DC

## 2019-03-28 MED ORDER — SODIUM CHLORIDE 0.9% FLUSH: 3.0000 mL | INTRAVENOUS | Status: DC | PRN

## 2019-03-28 MED ORDER — LIDOCAINE HCL (PF) 1 % IJ SOLN
INTRAMUSCULAR | Status: DC | PRN
  Administered 2019-03-28: 2 mL via INTRADERMAL

## 2019-03-28 MED ORDER — IODIXANOL 320 MG/ML IV SOLN
INTRAVENOUS | Status: DC | PRN
  Administered 2019-03-28: 100 mL via INTRAVENOUS

## 2019-03-28 MED ORDER — SODIUM CHLORIDE 0.9 % IV SOLN: 250.0000 mL | INTRAVENOUS | Status: DC | PRN

## 2019-03-28 MED ORDER — LIDOCAINE HCL (PF) 1 % IJ SOLN
INTRAMUSCULAR | Status: AC
  Filled 2019-03-28: qty 30

## 2019-03-28 MED ORDER — HEPARIN (PORCINE) IN NACL 1000-0.9 UT/500ML-% IV SOLN
INTRAVENOUS | Status: AC
  Filled 2019-03-28: qty 500

## 2019-03-28 SURGICAL SUPPLY — 5 items
KIT MICROPUNCTURE NIT STIFF (SHEATH) ×4 IMPLANT
SHEATH PROBE COVER 6X72 (BAG) ×4 IMPLANT
STOPCOCK MORSE 400PSI 3WAY (MISCELLANEOUS) ×4 IMPLANT
TRAY PV CATH (CUSTOM PROCEDURE TRAY) ×4 IMPLANT
TUBING CIL FLEX 10 FLL-RA (TUBING) ×4 IMPLANT

## 2019-03-28 NOTE — H&P (Signed)
History and Physical Interval Note:  03/28/2019 11:33 AM  Seth [name]  has presented today for surgery, with the diagnosis of complication of vascular implants.  The various methods of treatment have been discussed with the patient and family. After consideration of risks, benefits and other options for treatment, the patient has consented to  Procedure(s): UPPER EXTREMITY ANGIOGRAPHY (N/A) as a surgical intervention.  The patient's history has been reviewed, patient examined, no change in status, stable for surgery.  I have reviewed the patient's chart and labs.  Questions were answered to the patient's satisfaction.     Marty Heck    [name], male   DOB: 05-28-1966, 52 y.o.   MRN: [id number]  Reason for Consult: Follow-up   Referred by Carroll Sage, MD  Subjective:    Subjective [] Expand by Default   HPI:  Seth [name] is a 53 y.o. male had a previous radiocephalic fistula on the right that was revised with Artegraft subsequently fistulogram when this was not workig and was subsequently set up for left arm fistula which was brachiobasilic.  That time it was thought he could then be revised with above the elbow fistula.  He re-presented with MSSA bacteremia from a catheter.  Catheter was replaced.  He is now been out of the hospital with a working left IJ catheter.  Has no further infectious concerns.  He is wondering if possibly his right arm fistula would be usable in the future.      Past Medical History:  Diagnosis Date  . Diabetes mellitus    INSULIN DEPENDENT DIABETES  . Dialysis patient ([place])   . 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 ESRD (end stage renal disease) on dialysis [facility])    [facility] M/W/F  . Hypertension   . 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 Sleep apnea    USES CPAP        Family History  Problem Relation Age of Onset  . Diabetes Mother   .  Cancer Mother   . Diabetes Sister   . Diabetes Brother         Past Surgical History:  Procedure Laterality Date  . A/V FISTULAGRAM N/A 10/18/2018   Procedure: A/V FISTULAGRAM;  Surgeon: Marty Heck, MD;  Location: [place] [facility];  Service: Cardiovascular;  Laterality: N/A;  . AV FISTULA PLACEMENT  05/03/2012   Procedure: ARTERIOVENOUS (AV) FISTULA CREATION;  Surgeon: Mal [name], MD;  Location: [place];  Service: Vascular;  Laterality: Right;  . AV FISTULA PLACEMENT Left 10/23/2018   Procedure: ARTERIOVENOUS (AV) FISTULA CREATION;  Surgeon: Waynetta Sandy, MD;  Location: [place];  Service: Vascular;  Laterality: Left;  . INSERTION OF DIALYSIS CATHETER Left 12/03/2018   Procedure: INSERTION OF DIALYSIS CATHETER;  Surgeon: Marty Heck, MD;  Location: [place];  Service: Vascular;  Laterality: Left;  . ORIF MANDIBULAR FRACTURE N/A 11/10/2017   Procedure: OPEN REDUCTION INTERNAL FIXATION (ORIF) MANDIBULAR FRACTURE POSSIBLE MAXILLARY MANDIBULAR FIXATION;  Surgeon: Jodi Marble, MD;  Location: [place];  Service: ENT;  Laterality: N/A;  . REVISON OF ARTERIOVENOUS FISTULA Right 05/31/2018   Procedure: REVISION OF ARTERIOVENOUS FISTULA  RIGHT ARM WITH INTERPOSITION ARTEGRAFT 6MM X 16CM GRAFT;  Surgeon: Waynetta Sandy, MD;  Location: [place];  Service: Vascular;  Laterality: Right;  . TEE WITHOUT CARDIOVERSION N/A 07/10/2015   Procedure: TRANSESOPHAGEAL ECHOCARDIOGRAM (TEE);  Surgeon: Collier Salina  Tommy Rainwater, MD;  Location: [place] [facility];  Service: Cardiovascular;  Laterality: N/A;  . TEE WITHOUT CARDIOVERSION N/A 12/07/2018   Procedure: TRANSESOPHAGEAL ECHOCARDIOGRAM (TEE);  Surgeon: Acie Fredrickson Wonda Cheng, MD;  Location: [place];  Service: Cardiovascular;  Laterality: N/A;  . tracheostomy removal    . TRACHEOSTOMY TUBE PLACEMENT N/A 08/20/2013   Procedure: TRACHEOSTOMY Revision;  Surgeon: Melida Quitter, MD;  Location: [facility] OR;  Service: ENT;  Laterality: N/A;     Short Social History:  Social History        Tobacco Use  . Smoking status: Never Smoker  . Smokeless tobacco: Never Used  Substance Use Topics  . Alcohol use: No    Frequency: Never    No Known Allergies        Current Outpatient Medications  Medication Sig Dispense Refill  . amLODipine (NORVASC) 10 MG tablet Take 1 tablet (10 mg total) by mouth daily. 90 tablet 1  . aspirin EC 81 MG tablet Take 81 mg by mouth daily.    Marland Kitchen atorvastatin (LIPITOR) 40 MG tablet Take 1 tablet (40 mg total) by mouth daily at 6 PM. 90 tablet 3  . AURYXIA 1 GM 210 MG(Fe) tablet Take 630 mg by mouth See admin instructions. Take 630 mg 3 times daily with each meal and 420 mg with each snack    . diclofenac sodium (VOLTAREN) 1 % GEL Apply 4 g topically 4 (four) times daily. (Patient taking differently: Apply 4 g topically daily as needed (knee pain). ) 100 g 2  . insulin aspart (NOVOLOG FLEXPEN) 100 UNIT/ML FlexPen Inject 0-20 Units into the skin 3 (three) times daily with meals. CBG < 70: drink orange juice and recheck glucose or use glucose tabs. (Patient taking differently: Inject 5-15 Units into the skin 3 (three) times daily with meals. CBG < 70: drink orange juice and recheck glucose or use glucose tabs.) 15 mL PRN  . oxyCODONE-acetaminophen (PERCOCET) 10-325 MG tablet Take 1 tablet by mouth 3 (three) times daily as needed for pain. Please refill 30 days after last RX fill. 90 tablet 0  . triamcinolone cream (KENALOG) 0.1 % Apply 1 application topically 2 (two) times daily. (Patient taking differently: Apply 1 application topically 2 (two) times daily as needed (rash). ) 45 g 0   No current facility-administered medications for this visit.     Review of Systems  Constitutional:  Constitutional negative. HENT: HENT negative.  Eyes: Eyes negative.  Respiratory: Respiratory negative.  Cardiovascular: Cardiovascular negative.  GI: Gastrointestinal negative.  Musculoskeletal: Musculoskeletal  negative.  Skin: Skin negative.  Neurological: Neurological negative. Hematologic: Hematologic/lymphatic negative.  Psychiatric: Psychiatric negative.        Objective:   Objective        Vitals:   03/01/19 1150  BP: (!) 147/85  Pulse: 74  Resp: 20  Temp: 98.8 F (37.1 C)  SpO2: 94%  Weight: (!) 311 lb 4.8 oz (141.2 kg)  Height: 5\' 7"  (1.702 m)   Body mass index is 48.76 kg/m.  Physical Exam Eyes:     Pupils: Pupils are equal, round, and reactive to light.  Cardiovascular:     Rate and Rhythm: Normal rate.     Pulses:          Radial pulses are 2+ on the right side and 2+ on the left side.  Pulmonary:     Effort: Pulmonary effort is normal.  Musculoskeletal: Normal range of motion.        General: No swelling.  Comments: There is a strong palpable thrill in the right forearm.  I cannot feel any palpable thrill in the left upper arm.  Skin:    General: Skin is warm and dry.  Neurological:     Mental Status: He is alert.  Psychiatric:        Mood and Affect: Mood normal.        Behavior: Behavior normal.        Thought Content: Thought content normal.        Judgment: Judgment normal.     Data: I have independently interpreted his left upper arm fistula duplex.  There is a stenosis noted at the anastomosis the diameter is up to 0.71 cm proximally flow volumes are down to 287.   Assessment/Plan:   Assessment    52 year old male with end-stage renal disease currently on dialysis via left IJ catheter.  Appears that his basilic vein fistula on the left is maturing but he also has a fistula on the right which may be more mature given that I cannot feel a thrill in the left upper arm.  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  After venography he can either get a left second stage basilic vein fistula or graft if  that vein does not appear to be maturing or quite possibly we could perform a right-sided second stage basilic vein fistula as his previous radiocephalic fistula did drain to the basilic vein and it should be quite mature.  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  Patient is on board with any procedure that gets him access and can get his catheter out soon.  We will get him set up for the venography with possible fistulogram on the right and possible intervention on the right.   Waynetta Sandy MD [facility]         Electronically signed by Waynetta Sandy, MD at 03/01/2019 1:04 PM   Office Visit on 03/01/2019     Detailed Report     Note shared with patient

## 2019-03-28 NOTE — Progress Notes (Signed)
ISTAT K 6.8.  K sent to lab to verify.  Repeat K 6.4 per lab.  Anderson Malta, RN, [facility] Cath lab and Dr. Carlis Abbott notified.

## 2019-03-28 NOTE — Discharge Instructions (Signed)
Venogram, Care After This sheet gives you information about how to care for yourself after your procedure. Your health care provider may also give you more specific instructions. If you have problems or questions, contact your health care provider. What can I expect after the procedure? After the procedure, it is common to have:  Bruising or mild discomfort in the area where the IV was inserted (insertion site). Follow these instructions at home: Eating and drinking   Follow instructions from your health care provider about eating or drinking restrictions.  Drink a lot of fluids for the first several days after the procedure, as directed by your health care provider. This helps to wash (flush) the contrast out of your body. Examples of healthy fluids include water or low-calorie drinks. General instructions  Check your IV insertion area every day for signs of infection. Check for: ? Redness, swelling, or pain. ? Fluid or blood. ? Warmth. ? Pus or a bad smell.  Take over-the-counter and prescription medicines only as told by your health care provider.  Rest and return to your normal activities as told by your health care provider. Ask your health care provider what activities are safe for you.  Do not drive for 24 hours if you were given a medicine to help you relax (sedative), or until your health care provider approves.  Keep all follow-up visits as told by your health care provider. This is important. Contact a health care provider if:  Your skin becomes itchy or you develop a rash or hives.  You have a fever that does not get better with medicine.  You feel nauseous.  You vomit.  You have redness, swelling, or pain around the insertion site.  You have fluid or blood coming from the insertion site.  Your insertion area feels warm to the touch.  You have pus or a bad smell coming from the insertion site. Get help right away if:  You have difficulty breathing or  shortness of breath.  You develop chest pain.  You faint.  You feel very dizzy. These symptoms may represent a serious problem that is an emergency. Do not wait to see if the symptoms will go away. Get medical help right away. Call your local emergency services (911 in the [place] Do not drive yourself to the hospital. Summary  After your procedure, it is common to have bruising or mild discomfort in the area where the IV was inserted.  You should check your IV insertion area every day for signs of infection.  Take over-the-counter and prescription medicines only as told by your health care provider.  You should drink a lot of fluids for the first several days after the procedure to help flush the contrast from your body. This information is not intended to replace advice given to you by your health care provider. Make sure you discuss any questions you have with your health care provider. Document Released: 07/17/2013 Document Revised: 08/20/2016 Document Reviewed: 08/20/2016 Elsevier Interactive Patient Education  2019 [employer].

## 2019-03-28 NOTE — Op Note (Signed)
Date: March 28, 2019  Preoperative diagnosis: Need for permanent hemodialysis access  Postoperative diagnosis: Same  Procedure: 1.  Bilateral upper extremity venogram 2.  Ultrasound-guided access of the left brachiobasilic fistula 3.  Left upper extremity fistulogram  Surgeon: Dr. Marty Heck, MD  Indications: Patient is a 53 year old male who has undergone multiple previous access procedures including a right radiocephalic AV fistula that required revision with an Artegraft jump graft.  He has also recently undergone left upper extremity first stage brachiobasilic fistula placement but has no appreciable thrill in the upper arm (but does have a thrill at the elbow).  He presents today for bilateral upper extremity venograms and to sort out what his options are for access moving forward after risks and benefits are discussed.  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  The left appears to be a more durable option with no evidence of central venous stenosis or occlusion even though there was limited evaluation around the catheter given that he has a left IJ tunneled dialysis catheter in place.  Ultimately the left brachiobasilic fistula was accessed with ultrasound guidance and it appears to be maturing and fills robustly into the deep brachial and central veins.  Even on reflux imaging there is no evidence of flow-limiting stenosis.  I think his best option would likely be converting his left brachiobasilic fistula to a second stage and if this fails potentially left upper arm graft.  Details: The patient was identified in preoperative holding.  He had a IV placed in his right antecubitum and his left hand for bilateral upper extremity venograms.  He was subsequently taken back to [facility] lab and placed on the table in supine position.  A preop timeout was performed to identify  patient, procedure and site.  There we started on the left by injecting IV in his hand and central structures were evaluated as well as left upper extremity pertinent findings noted above.  We then turned our attention to the right side injected IV at his antecubitum with contrast and then it was apparent that he had a central occlusion likely and subclavian vein on the right which I do not think would be a durable option.  Given the fact that we could not feel a thrill in his upper arm on the left and there was somewhat limited evaluation injecting a small IV in his wrist.  I elected to access his left brachiobasilic fistula.  I then use ultrasound guidance and evaluated the fistula was patent and image was saved.  I then placed a micro access needle into the fistula under ultrasound guidance on the left arm after been prepped and draped.  I then inserted a microwire and the micro sheath that was large about 2 cm in the access.  We then imaged the basilic vein fistula including the central structures with pertinent findings noted above.  Inflated blood pressure cuff on left arm and got a retrograde shot that showed no evidence of stenosis proximally with a widely patent arterial anastomosis.  That point in time a 4-0 pursestring was placed around the micro sheath and it was removed.  He tolerated the procedure without any complications.  Complications: None  Condition: Stable  Contrast: 100 mL  Marty Heck, MD [facility] [place] Office: [phone] Pager: [place]

## 2019-03-29 MED FILL — Lidocaine HCl Local Preservative Free (PF) Inj 1%: INTRAMUSCULAR | Qty: 30 | Status: AC

## 2019-03-30 ENCOUNTER — Other Ambulatory Visit: Payer: Self-pay

## 2019-03-30 ENCOUNTER — Encounter (HOSPITAL_COMMUNITY): Payer: Self-pay | Admitting: *Deleted

## 2019-03-30 NOTE — Progress Notes (Signed)
Denies chest pain, shob, or cardiologist. States his blood sugar never drops below 70 and did not want instructions on how to treat a low blood sugar.

## 2019-03-31 ENCOUNTER — Encounter: Payer: Self-pay | Admitting: *Deleted

## 2019-04-01 MED ORDER — DEXTROSE 5 % IV SOLN
3.0000 g | INTRAVENOUS | Status: AC
  Administered 2019-04-02: 3 g via INTRAVENOUS
  Filled 2019-04-01: qty 3

## 2019-04-02 ENCOUNTER — Ambulatory Visit (HOSPITAL_COMMUNITY): Payer: Medicare Other | Admitting: Certified Registered Nurse Anesthetist

## 2019-04-02 ENCOUNTER — Ambulatory Visit (HOSPITAL_COMMUNITY)
Admission: RE | Admit: 2019-04-02 | Discharge: 2019-04-02 | Disposition: A | Discharge status: Home / Self Care | Payer: Medicare Other | Attending: Vascular Surgery | Admitting: Vascular Surgery

## 2019-04-02 ENCOUNTER — Encounter (HOSPITAL_COMMUNITY)
Admission: RE | Disposition: A | Discharge status: Home / Self Care | Payer: Self-pay | Source: Home / Self Care | Attending: Vascular Surgery

## 2019-04-02 ENCOUNTER — Other Ambulatory Visit: Payer: Self-pay

## 2019-04-02 ENCOUNTER — Encounter (HOSPITAL_COMMUNITY): Payer: Self-pay | Admitting: *Deleted

## 2019-04-02 DIAGNOSIS — Z992 Dependence on renal dialysis: Secondary | ICD-10-CM | POA: Insufficient documentation

## 2019-04-02 DIAGNOSIS — Z1159 Encounter for screening for other viral diseases: Secondary | ICD-10-CM | POA: Diagnosis not present

## 2019-04-02 DIAGNOSIS — N186 End stage renal disease: Secondary | ICD-10-CM | POA: Insufficient documentation

## 2019-04-02 DIAGNOSIS — G4730 Sleep apnea, unspecified: Secondary | ICD-10-CM | POA: Insufficient documentation

## 2019-04-02 DIAGNOSIS — Z794 Long term (current) use of insulin: Secondary | ICD-10-CM | POA: Diagnosis not present

## 2019-04-02 DIAGNOSIS — Z79899 Other long term (current) drug therapy: Secondary | ICD-10-CM | POA: Diagnosis not present

## 2019-04-02 DIAGNOSIS — Z7982 Long term (current) use of aspirin: Secondary | ICD-10-CM | POA: Diagnosis not present

## 2019-04-02 DIAGNOSIS — N185 Chronic kidney disease, stage 5: Secondary | ICD-10-CM | POA: Diagnosis not present

## 2019-04-02 DIAGNOSIS — I120 Hypertensive chronic kidney disease with stage 5 chronic kidney disease or end stage renal disease: Secondary | ICD-10-CM | POA: Diagnosis not present

## 2019-04-02 DIAGNOSIS — Z6841 Body Mass Index (BMI) 40.0 and over, adult: Secondary | ICD-10-CM | POA: Diagnosis not present

## 2019-04-02 DIAGNOSIS — G9520 Unspecified cord compression: Secondary | ICD-10-CM

## 2019-04-02 DIAGNOSIS — E1122 Type 2 diabetes mellitus with diabetic chronic kidney disease: Secondary | ICD-10-CM | POA: Insufficient documentation

## 2019-04-02 HISTORY — DX: Type 2 diabetes mellitus without complications: E11.9

## 2019-04-02 HISTORY — PX: BASCILIC VEIN TRANSPOSITION: SHX5742

## 2019-04-02 LAB — GLUCOSE, CAPILLARY
Glucose-Capillary: 209 mg/dL — ABNORMAL HIGH (ref 70–99)
Glucose-Capillary: 145 mg/dL — ABNORMAL HIGH (ref 70–99)

## 2019-04-02 LAB — SARS CORONAVIRUS 2 BY RT PCR (HOSPITAL ORDER, PERFORMED IN ~~LOC~~ HOSPITAL LAB): SARS Coronavirus 2: NEGATIVE

## 2019-04-02 LAB — POCT I-STAT 4, (NA,K, GLUC, HGB,HCT)
Glucose, Bld: 194 mg/dL — ABNORMAL HIGH (ref 70–99)
HCT: 46 % (ref 39.0–52.0)
Hemoglobin: 15.6 g/dL (ref 13.0–17.0)
Potassium: 5.6 mmol/L — ABNORMAL HIGH (ref 3.5–5.1)
Sodium: 131 mmol/L — ABNORMAL LOW (ref 135–145)

## 2019-04-02 SURGERY — TRANSPOSITION, VEIN, BASILIC
Anesthesia: General | Site: Arm Upper | Laterality: Left

## 2019-04-02 MED ORDER — OXYCODONE HCL 5 MG PO TABS
5.0000 mg | ORAL_TABLET | Freq: Once | ORAL | Status: AC | PRN
  Administered 2019-04-02: 5 mg via ORAL

## 2019-04-02 MED ORDER — OXYCODONE HCL 5 MG PO TABS
ORAL_TABLET | ORAL | Status: AC
  Filled 2019-04-02: qty 1

## 2019-04-02 MED ORDER — SODIUM CHLORIDE 0.9 % IV SOLN
INTRAVENOUS | Status: DC
  Administered 2019-04-02 (×2): via INTRAVENOUS

## 2019-04-02 MED ORDER — FENTANYL CITRATE (PF) 250 MCG/5ML IJ SOLN
INTRAMUSCULAR | Status: AC
  Filled 2019-04-02: qty 5

## 2019-04-02 MED ORDER — PROPOFOL 10 MG/ML IV BOLUS
INTRAVENOUS | Status: AC
  Filled 2019-04-02: qty 20

## 2019-04-02 MED ORDER — MIDAZOLAM HCL 2 MG/2ML IJ SOLN
INTRAMUSCULAR | Status: DC | PRN
  Administered 2019-04-02 (×2): 1 mg via INTRAVENOUS

## 2019-04-02 MED ORDER — OXYCODONE-ACETAMINOPHEN 10-325 MG PO TABS: 1.0000 | ORAL_TABLET | Freq: Four times a day (QID) | ORAL | 0 refills | Status: DC | PRN

## 2019-04-02 MED ORDER — PROPOFOL 10 MG/ML IV BOLUS
INTRAVENOUS | Status: DC | PRN
  Administered 2019-04-02: 200 mg via INTRAVENOUS

## 2019-04-02 MED ORDER — FENTANYL CITRATE (PF) 100 MCG/2ML IJ SOLN: 25.0000 ug | INTRAMUSCULAR | Status: DC | PRN

## 2019-04-02 MED ORDER — ONDANSETRON HCL 4 MG/2ML IJ SOLN: 4.0000 mg | Freq: Once | INTRAMUSCULAR | Status: DC | PRN

## 2019-04-02 MED ORDER — ONDANSETRON HCL 4 MG/2ML IJ SOLN
INTRAMUSCULAR | Status: AC
  Filled 2019-04-02: qty 2

## 2019-04-02 MED ORDER — SODIUM CHLORIDE 0.9 % IV SOLN: INTRAVENOUS | Status: DC

## 2019-04-02 MED ORDER — ONDANSETRON HCL 4 MG/2ML IJ SOLN
INTRAMUSCULAR | Status: DC | PRN
  Administered 2019-04-02: 4 mg via INTRAVENOUS

## 2019-04-02 MED ORDER — LIDOCAINE HCL (PF) 1 % IJ SOLN
INTRAMUSCULAR | Status: AC
  Filled 2019-04-02: qty 30

## 2019-04-02 MED ORDER — LIDOCAINE 2% (20 MG/ML) 5 ML SYRINGE
INTRAMUSCULAR | Status: DC | PRN
  Administered 2019-04-02: 100 mg via INTRAVENOUS

## 2019-04-02 MED ORDER — MIDAZOLAM HCL 2 MG/2ML IJ SOLN
INTRAMUSCULAR | Status: AC
  Filled 2019-04-02: qty 2

## 2019-04-02 MED ORDER — SODIUM CHLORIDE 0.9 % IV SOLN
INTRAVENOUS | Status: AC
  Filled 2019-04-02: qty 1.2

## 2019-04-02 MED ORDER — FENTANYL CITRATE (PF) 250 MCG/5ML IJ SOLN
INTRAMUSCULAR | Status: DC | PRN
  Administered 2019-04-02 (×4): 50 ug via INTRAVENOUS

## 2019-04-02 MED ORDER — SODIUM CHLORIDE 0.9 % IV SOLN
INTRAVENOUS | Status: DC | PRN
  Administered 2019-04-02: 500 mL

## 2019-04-02 MED ORDER — OXYCODONE HCL 5 MG/5ML PO SOLN: 5.0000 mg | Freq: Once | ORAL | Status: AC | PRN

## 2019-04-02 MED ORDER — DEXAMETHASONE SODIUM PHOSPHATE 10 MG/ML IJ SOLN
INTRAMUSCULAR | Status: DC | PRN
  Administered 2019-04-02: 5 mg via INTRAVENOUS

## 2019-04-02 MED ORDER — PHENYLEPHRINE HCL (PRESSORS) 10 MG/ML IV SOLN
INTRAVENOUS | Status: DC | PRN
  Administered 2019-04-02: 200 ug via INTRAVENOUS
  Administered 2019-04-02 (×2): 120 ug via INTRAVENOUS
  Administered 2019-04-02: 80 ug via INTRAVENOUS
  Administered 2019-04-02: 120 ug via INTRAVENOUS
  Administered 2019-04-02: 80 ug via INTRAVENOUS
  Administered 2019-04-02: 120 ug via INTRAVENOUS
  Administered 2019-04-02: 200 ug via INTRAVENOUS
  Administered 2019-04-02: 120 ug via INTRAVENOUS

## 2019-04-02 MED ORDER — 0.9 % SODIUM CHLORIDE (POUR BTL) OPTIME
TOPICAL | Status: DC | PRN
  Administered 2019-04-02: 1000 mL

## 2019-04-02 MED ORDER — PAPAVERINE HCL 30 MG/ML IJ SOLN
INTRAMUSCULAR | Status: AC
  Filled 2019-04-02: qty 2

## 2019-04-02 SURGICAL SUPPLY — 30 items
ARMBAND PINK RESTRICT EXTREMIT (MISCELLANEOUS) ×3 IMPLANT
CANISTER SUCT 3000ML PPV (MISCELLANEOUS) ×3 IMPLANT
CLIP VESOCCLUDE MED 24/CT (CLIP) ×2 IMPLANT
CLIP VESOCCLUDE SM WIDE 24/CT (CLIP) ×2 IMPLANT
COVER PROBE W GEL 5X96 (DRAPES) ×3 IMPLANT
COVER WAND RF STERILE (DRAPES) ×3 IMPLANT
DERMABOND ADVANCED (GAUZE/BANDAGES/DRESSINGS) ×4
DERMABOND ADVANCED .7 DNX12 (GAUZE/BANDAGES/DRESSINGS) ×1 IMPLANT
ELECT REM PT RETURN 9FT ADLT (ELECTROSURGICAL) ×3
ELECTRODE REM PT RTRN 9FT ADLT (ELECTROSURGICAL) ×1 IMPLANT
GLOVE BIO SURGEON STRL SZ7.5 (GLOVE) ×3 IMPLANT
GOWN STRL REUS W/ TWL LRG LVL3 (GOWN DISPOSABLE) ×2 IMPLANT
GOWN STRL REUS W/ TWL XL LVL3 (GOWN DISPOSABLE) ×1 IMPLANT
GOWN STRL REUS W/TWL LRG LVL3 (GOWN DISPOSABLE) ×4
GOWN STRL REUS W/TWL XL LVL3 (GOWN DISPOSABLE) ×2
KIT BASIN OR (CUSTOM PROCEDURE TRAY) ×3 IMPLANT
KIT TURNOVER KIT B (KITS) ×3 IMPLANT
NS IRRIG 1000ML POUR BTL (IV SOLUTION) ×3 IMPLANT
PACK CV ACCESS (CUSTOM PROCEDURE TRAY) ×3 IMPLANT
PAD ARMBOARD 7.5X6 YLW CONV (MISCELLANEOUS) ×6 IMPLANT
SUT MNCRL AB 4-0 PS2 18 (SUTURE) ×3 IMPLANT
SUT PROLENE 5 0 C 1 24 (SUTURE) ×2 IMPLANT
SUT PROLENE 6 0 BV (SUTURE) ×7 IMPLANT
SUT SILK 2 0 SH (SUTURE) IMPLANT
SUT VIC AB 3-0 SH 27 (SUTURE) ×4
SUT VIC AB 3-0 SH 27X BRD (SUTURE) ×1 IMPLANT
SUT VIC AB 4-0 PS2 18 (SUTURE) ×2 IMPLANT
TOWEL GREEN STERILE (TOWEL DISPOSABLE) ×3 IMPLANT
UNDERPAD 30X30 (UNDERPADS AND DIAPERS) ×3 IMPLANT
WATER STERILE IRR 1000ML POUR (IV SOLUTION) ×3 IMPLANT

## 2019-04-02 NOTE — H&P (Signed)
HPI:  Seth [name] a 53 y.o.malehad a previous radiocephalic fistula on the right that was revised with Artegraft subsequently fistulogram when this was not workigand was subsequently set up for left arm fistula which was brachiobasilic. That time it was thought he could then be revised with above the elbow fistula. He re-presented with MSSA bacteremia from a catheter. Catheter was replaced. He is now been out of the hospital with a working left IJ catheter. Has no further infectious concerns. He is wondering if possibly his right arm fistula would be usable in the future.      Past Medical History:  Diagnosis Date  . Diabetes mellitus    INSULIN DEPENDENT DIABETES  . Dialysis patient ([place])   . 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 ESRD (end stage renal disease) on dialysis [facility])    [facility] M/W/F  . Hypertension   . RETINAL DETACHMENT, HX OF 06/20/2007   Qualifier: Diagnosis of By: Vinetta Bergamo RN, [name]   . Sleep apnea    USES CPAP        Family History  Problem Relation Age of Onset  . Diabetes Mother   . Cancer Mother   . Diabetes Sister   . Diabetes Brother         Past Surgical History:  Procedure Laterality Date  . A/V FISTULAGRAM N/A 10/18/2018   Procedure: A/V FISTULAGRAM; Surgeon: Marty Heck, MD; Location: [place] [facility]; Service: Cardiovascular; Laterality: N/A;  . AV FISTULA PLACEMENT  05/03/2012   Procedure: ARTERIOVENOUS (AV) FISTULA CREATION; Surgeon: Mal [name], MD; Location: [place]; Service: Vascular; Laterality: Right;  . AV FISTULA PLACEMENT Left 10/23/2018   Procedure: ARTERIOVENOUS (AV) FISTULA CREATION; Surgeon: Waynetta Sandy, MD; Location: [place]; Service: Vascular; Laterality: Left;  . INSERTION OF DIALYSIS CATHETER Left 12/03/2018   Procedure: INSERTION OF DIALYSIS CATHETER; Surgeon: Marty Heck, MD; Location: [place]; Service: Vascular; Laterality: Left;   . ORIF MANDIBULAR FRACTURE N/A 11/10/2017   Procedure: OPEN REDUCTION INTERNAL FIXATION (ORIF) MANDIBULAR FRACTURE POSSIBLE MAXILLARY MANDIBULAR FIXATION; Surgeon: Jodi Marble, MD; Location: [place]; Service: ENT; Laterality: N/A;  . REVISON OF ARTERIOVENOUS FISTULA Right 05/31/2018   Procedure: REVISION OF ARTERIOVENOUS FISTULA RIGHT ARM WITH INTERPOSITION ARTEGRAFT 6MM X 16CM GRAFT; Surgeon: Waynetta Sandy, MD; Location: [place]; Service: Vascular; Laterality: Right;  . TEE WITHOUT CARDIOVERSION N/A 07/10/2015   Procedure: TRANSESOPHAGEAL ECHOCARDIOGRAM (TEE); Surgeon: Josue Hector, MD; Location: [place]; Service: Cardiovascular; Laterality: N/A;  . TEE WITHOUT CARDIOVERSION N/A 12/07/2018   Procedure: TRANSESOPHAGEAL ECHOCARDIOGRAM (TEE); Surgeon: Acie Fredrickson Wonda Cheng, MD; Location: [place]; Service: Cardiovascular; Laterality: N/A;  . tracheostomy removal    . TRACHEOSTOMY TUBE PLACEMENT N/A 08/20/2013   Procedure: TRACHEOSTOMY Revision; Surgeon: Melida Quitter, MD; Location: [facility] OR; Service: ENT; Laterality: N/A;    Short Social History: Social History        Tobacco Use  . Smoking status: Never Smoker  . Smokeless tobacco: Never Used  Substance Use Topics  . Alcohol use: No    Frequency: Never    No Known Allergies        Current Outpatient Medications  Medication Sig Dispense Refill  . amLODipine (NORVASC) 10 MG tablet Take 1 tablet (10 mg total) by mouth daily. 90 tablet 1  . aspirin EC 81 MG tablet Take 81 mg by mouth daily.    Marland Kitchen atorvastatin (LIPITOR) 40 MG tablet Take 1 tablet (40 mg total) by mouth daily at 6 PM. 90 tablet 3  .  AURYXIA 1 GM 210 MG(Fe) tablet Take 630 mg by mouth See admin instructions. Take 630 mg 3 times daily with each meal and 420 mg with each snack    . diclofenac sodium (VOLTAREN) 1 % GEL Apply 4 g topically 4 (four) times daily. (Patient taking differently: Apply 4 g topically daily as  needed (knee pain). ) 100 g 2  . insulin aspart (NOVOLOG FLEXPEN) 100 UNIT/ML FlexPen Inject 0-20 Units into the skin 3 (three) times daily with meals. CBG < 70: drink orange juice and recheck glucose or use glucose tabs. (Patient taking differently: Inject 5-15 Units into the skin 3 (three) times daily with meals. CBG < 70: drink orange juice and recheck glucose or use glucose tabs.) 15 mL PRN  . oxyCODONE-acetaminophen (PERCOCET) 10-325 MG tablet Take 1 tablet by mouth 3 (three) times daily as needed for pain. Please refill 30 days after last RX fill. 90 tablet 0  . triamcinolone cream (KENALOG) 0.1 % Apply 1 application topically 2 (two) times daily. (Patient taking differently: Apply 1 application topically 2 (two) times daily as needed (rash). ) 45 g 0   No current facility-administered medications for this visit.    Review of Systems Constitutional:Constitutional negative. HENT:HENT negative.  Eyes:Eyes negative.  Respiratory:Respiratory negative.  Cardiovascular:Cardiovascular negative.  YB:[id number] negative.  Musculoskeletal:Musculoskeletal negative.  Skin:Skin negative.  Neurological:Neurological negative. Hematologic:Hematologic/lymphatic negative.  Psychiatric:Psychiatric negative.     Objective:   Vitals:   04/02/19 0927  BP: (!) 168/93  Pulse: (!) 103  Resp: 20  Temp: 98.1 F (36.7 C)  SpO2: 97%     Physical Exam Eyes:  Pupils: Pupils are equal, round, and reactive to light.  Cardiovascular:  Rate and Rhythm: Normal rate.  Pulses:  Radial pulses are 2+on the right side and 2+on the left side.  Pulmonary:  Effort: Pulmonary effort is normal.  Musculoskeletal:Normal range of motion.  General: No swelling.  Comments: There is a strong palpable thrill in the right forearm. I cannot feel any palpable thrill in the left upper arm. Skin: General: Skin is warmand dry.  Neurological:   Mental Status: He is alert.  Psychiatric:  Mood and Affect: Moodnormal.  Behavior: Behaviornormal.  Thought Content: Thought contentnormal.  Judgment: Judgmentnormal.     Data: I have independently interpreted his left upper arm fistula duplex. There is a stenosis noted at the anastomosis the diameter is up to 0.71 cm proximally flow volumes are down to 287.   Assessment/Plan:     53 year old male with end-stage renal disease currently on dialysis via left IJ catheter. Appears that his basilic vein fistula on the left is maturing but he also has a fistula on the right which may be more mature given that I cannot feel a thrill in the left upper arm.  He is undergone fistulogram which demonstrates that the left basilic vein is likely the best for fistula creation.  We will proceed with left arm second stage basilic vein versus graft placement today in the operating room.   [place]ia Dom CainMD [facility]

## 2019-04-02 NOTE — Anesthesia Procedure Notes (Signed)
Procedure Name: LMA Insertion Date/Time: 04/02/2019 11:58 AM Performed by: Bryson Corona, CRNA Pre-anesthesia Checklist: Patient identified, Emergency Drugs available, Suction available and Patient being monitored Patient Re-evaluated:Patient Re-evaluated prior to induction Oxygen Delivery Method: Circle System Utilized Preoxygenation: Pre-oxygenation with 100% oxygen Induction Type: IV induction Ventilation: Mask ventilation without difficulty LMA: LMA inserted LMA Size: 5.0 Number of attempts: 1 Airway Equipment and Method: Bite block Placement Confirmation: positive ETCO2 Tube secured with: Tape Dental Injury: Teeth and Oropharynx as per pre-operative assessment

## 2019-04-02 NOTE — Transfer of Care (Signed)
Immediate Anesthesia Transfer of Care Note  Patient: Seth [name]  Procedure(s) Performed: BASILIC VEIN TRANSPOSITION SECOND STAGE LEFT (Left Arm Upper)  Patient Location: PACU  Anesthesia Type:General  Level of Consciousness: drowsy and patient cooperative  Airway & Oxygen Therapy: Patient Spontanous Breathing and Patient connected to face mask oxygen  Post-op Assessment: Report given to RN and Post -op Vital signs reviewed and stable  Post vital signs: Reviewed and stable  Last Vitals:  Vitals Value Taken Time  BP 150/73 04/02/19 1424  Temp    Pulse 80 04/02/19 1425  Resp 24 04/02/19 1425  SpO2 100 % 04/02/19 1425  Vitals shown include unvalidated device data.  Last Pain:  Vitals:   04/02/19 1029  TempSrc:   PainSc: 0-No pain      Patients Stated Pain Goal: 2 (56/15/37 9432)  Complications: No apparent anesthesia complications

## 2019-04-02 NOTE — OR Nursing (Signed)
One pair of small yellow colored hoop style earrings removed, placed in specimen bag, tagged with patient sticker and attached to inside of patient chart for transfer to PACU at case end.

## 2019-04-02 NOTE — Op Note (Signed)
° ° °  Patient name: Seth [name] MRN: [id number] DOB: 1966/08/03 Sex: male  04/02/2019 Pre-operative Diagnosis: esrd Post-operative diagnosis:  Same Surgeon:  Erlene Quan C. Donzetta Matters, MD Assistant: OR nurses  Procedure Performed: Left upper arm second stage basilic vein fistula   Indications: 53 year old male history of end-stage renal disease currently on dialysis via catheter.  He has a first stage basilic vein fistula in his left upper arm.  He is undergone fistulogram demonstrates patency and is indicated for second stage procedure.  Findings: Basilic vein was quite deep in his upper arm.  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  Given the size of his upper arm I tied off the previous anastomosis re-tunneled and so to his brachial artery which was larger several centimeters higher in the arm.  At completion there was flow in the fistula although was somewhat pulsatile.  He did have a good signal at the radial artery at the wrist.  If this does not work patient will need left upper arm AV graft.   Procedure:  The patient was identified in the holding area and taken to the operating room was placed by operative when general anesthesia induced.  He was sterilely prepped draped left upper extremity usual fashion antibiotics were minister and timeout called.  Ultrasound was used to identify the fistula which was noted to be quite deep in his upper arm.  I made an incision above the antecubitum.  I was able to dissect all her back to the anastomosis which happened to be to a very small artery.  I could feel a pulse deeper.  I made a counterincision near the axilla.  I dissected out the entirety the vein taking branches between clips and ties.  I preserved most of the nerve although one branch did have to be taken of the sensory nerve attached to the vein.  I then dissected deeper to identify the brachial artery above the antecubitum.  This was much larger than the accessory artery  had previously been anastomosed to.  I then transected my previous anastomosis and oversewed this with 5-0 Prolene suture with a vein cuff.  I flushed my vein margin for orientation and tunneled it laterally.  Given the size of his arm I could not get this to lateral.  I then flushed again and clamped the vein.  I clamped the brachial artery distally proximally opened longitudinally.  I sewed the vein end-to-side with 6-0 Prolene suture.  Prior to completion anastomosis allowed flushing all directions.  Upon completion I did have a good signal but only weak thrill and pulsatility in the graft.  There were no palpable radial or ulnar arteries at the wrist which was consistent with preoperative exam.  I did have signals there that augmented with compression of the fistula.  Satisfied with this I irrigated the wounds and obtain hemostasis.  I closed the layers of Vicryl Monocryl.  Dermabond is placed in the level the skin.  He was then away from anesthesia having tolerated procedure without immediate complication.  All counts were correct at completion.  EBL: 100 cc   Jaunice Mirza C. Donzetta Matters, MD [facility] Office: [phone] Pager: [phone]

## 2019-04-02 NOTE — Anesthesia Preprocedure Evaluation (Addendum)
Anesthesia Evaluation  Patient identified by MRN, date of birth, ID band Patient awake    Reviewed: Allergy & Precautions, NPO status , Patient's Chart, lab work & pertinent test results  History of Anesthesia Complications Negative for: history of anesthetic complications  Airway Mallampati: III  TM Distance: >3 FB Neck ROM: Full    Dental  (+) Poor Dentition, Missing   Pulmonary sleep apnea and Continuous Positive Airway Pressure Ventilation ,    Pulmonary exam normal        Cardiovascular hypertension, Pt. on medications Normal cardiovascular exam     Neuro/Psych negative neurological ROS  negative psych ROS   GI/Hepatic negative GI ROS, Neg liver ROS,   Endo/Other  diabetes, Type 2, Insulin DependentMorbid obesity  Renal/GU ESRF and DialysisRenal disease  negative genitourinary   Musculoskeletal negative musculoskeletal ROS (+)   Abdominal   Peds  Hematology negative hematology ROS (+)   Anesthesia Other Findings   Reproductive/Obstetrics                            Anesthesia Physical Anesthesia Plan  ASA: IV  Anesthesia Plan: General   Post-op Pain Management:    Induction: Intravenous  PONV Risk Score and Plan: 2 and Ondansetron, Dexamethasone, Midazolam and Treatment may vary due to age or medical condition  Airway Management Planned: LMA and Oral ETT  Additional Equipment: None  Intra-op Plan:   Post-operative Plan: Extubation in OR  Informed Consent: I have reviewed the patients History and Physical, chart, labs and discussed the procedure including the risks, benefits and alternatives for the proposed anesthesia with the patient or authorized representative who has indicated his/her understanding and acceptance.     Dental advisory given  Plan Discussed with:   Anesthesia Plan Comments:        Anesthesia Quick Evaluation

## 2019-04-02 NOTE — Discharge Instructions (Signed)
° °  [facility] ° °Discharge Instructions ° °AV Fistula or Graft Surgery for Dialysis Access ° °Please refer to the following instructions for your post-procedure care. Your surgeon or physician assistant will discuss any changes with you. ° °Activity ° °You may drive the day following your surgery, if you are comfortable and no longer taking prescription pain medication. Resume full activity as the soreness in your incision resolves. ° °Bathing/Showering ° °You may shower after you go home. Keep your incision dry for 48 hours. Do not soak in a bathtub, hot tub, or swim until the incision heals completely. You may not shower if you have a hemodialysis catheter. ° °Incision Care ° °Clean your incision with mild soap and water after 48 hours. Pat the area dry with a clean towel. You do not need a bandage unless otherwise instructed. Do not apply any ointments or creams to your incision. You may have skin glue on your incision. Do not peel it off. It will come off on its own in about one week. Your arm may swell a bit after surgery. To reduce swelling use pillows to elevate your arm so it is above your heart. Your doctor will tell you if you need to lightly wrap your arm with an ACE bandage. ° °Diet ° °Resume your normal diet. There are not special food restrictions following this procedure. In order to heal from your surgery, it is CRITICAL to get adequate nutrition. Your body requires vitamins, minerals, and protein. Vegetables are the best source of vitamins and minerals. Vegetables also provide the perfect balance of protein. Processed food has little nutritional value, so try to avoid this. ° °Medications ° °Resume taking all of your medications. If your incision is causing pain, you may take over-the counter pain relievers such as acetaminophen (Tylenol). If you were prescribed a stronger pain medication, please be aware these medications can cause nausea and constipation. Prevent  nausea by taking the medication with a snack or meal. Avoid constipation by drinking plenty of fluids and eating foods with high amount of fiber, such as fruits, vegetables, and grains. Do not take Tylenol if you are taking prescription pain medications. ° ° ° ° °Follow up °Your surgeon may want to see you in the office following your access surgery. If so, this will be arranged at the time of your surgery. ° °Please call us immediately for any of the following conditions: ° °Increased pain, redness, drainage (pus) from your incision site °Fever of 101 degrees or higher °Severe or worsening pain at your incision site °Hand pain or numbness. ° °Reduce your risk of vascular disease: ° °Stop smoking. If you would like help, call [facility] at 1-800-QUIT-NOW (1-800-784-8669) or [facility] at 336-586-4000 ° °Manage your cholesterol °Maintain a desired weight °Control your diabetes °Keep your blood pressure down ° °Dialysis ° °It will take several weeks to several months for your new dialysis access to be ready for use. Your surgeon will determine when it is OK to use it. Your nephrologist will continue to direct your dialysis. You can continue to use your Permcath until your new access is ready for use. ° °If you have any questions, please call the office at [phone]. ° °

## 2019-04-03 ENCOUNTER — Encounter (HOSPITAL_COMMUNITY): Payer: Self-pay | Admitting: Vascular Surgery

## 2019-04-03 NOTE — Anesthesia Postprocedure Evaluation (Signed)
Anesthesia Post Note  Patient: Seth [name]  Procedure(s) Performed: BASILIC VEIN TRANSPOSITION SECOND STAGE LEFT (Left Arm Upper)     Patient location during evaluation: PACU Anesthesia Type: General Level of consciousness: awake and alert Pain management: pain level controlled Vital Signs Assessment: post-procedure vital signs reviewed and stable Respiratory status: spontaneous breathing, nonlabored ventilation and respiratory function stable Cardiovascular status: blood pressure returned to baseline and stable Postop Assessment: no apparent nausea or vomiting Anesthetic complications: no    Last Vitals:  Vitals:   04/02/19 1424 04/02/19 1445  BP: (!) 150/73   Pulse: 78   Resp: (!) 23   Temp: (!) 36.2 C (!) 36.1 C  SpO2: 100% 92%    Last Pain:  Vitals:   04/02/19 1424  TempSrc:   PainSc: 10-Worst pain ever                 Lidia Collum

## 2019-04-09 DIAGNOSIS — Z992 Dependence on renal dialysis: Secondary | ICD-10-CM | POA: Diagnosis not present

## 2019-04-09 DIAGNOSIS — N186 End stage renal disease: Secondary | ICD-10-CM | POA: Diagnosis not present

## 2019-04-16 ENCOUNTER — Other Ambulatory Visit: Payer: Self-pay | Admitting: Internal Medicine

## 2019-04-16 DIAGNOSIS — G9520 Unspecified cord compression: Secondary | ICD-10-CM

## 2019-04-16 NOTE — Telephone Encounter (Signed)
PDMP review.  Looks like he gets 90 tabs from [place] monthly.  He did not fill the 15 tab supply, appropriately.  I will fill this at my next opportunity.  Thanks!

## 2019-04-16 NOTE — Telephone Encounter (Signed)
Refill Request   oxyCODONE-acetaminophen (PERCOCET) 10-325 MG tablet   [facility] [place], [place]

## 2019-04-17 ENCOUNTER — Telehealth: Payer: Self-pay | Admitting: *Deleted

## 2019-04-17 DIAGNOSIS — G9520 Unspecified cord compression: Secondary | ICD-10-CM

## 2019-04-17 MED ORDER — OXYCODONE-ACETAMINOPHEN 10-325 MG PO TABS: 1.0000 | ORAL_TABLET | Freq: Four times a day (QID) | ORAL | 0 refills | Status: DC | PRN

## 2019-04-17 NOTE — Telephone Encounter (Signed)
#  75 refilled.

## 2019-04-17 NOTE — Telephone Encounter (Signed)
Pt went to pharm to pick up pain med and was given the 15 prescribed by dr Donzetta Matters, he is upset, could you send a scrip[t for 75 to the pharmacy, he usually get #90, he states he did not know dr Donzetta Matters had sent in some or he would have not taken the bag at the pharm, he didn't look til he got home.

## 2019-04-19 ENCOUNTER — Telehealth: Payer: Self-pay

## 2019-04-19 NOTE — Telephone Encounter (Signed)
rtc to pt, pt answered, stated evrything is straight thanks and hung up

## 2019-04-19 NOTE — Telephone Encounter (Signed)
Requesting to speak with a nurse about meds. Please call back.  

## 2019-04-25 ENCOUNTER — Telehealth (HOSPITAL_COMMUNITY): Payer: Self-pay | Admitting: Rehabilitation

## 2019-04-25 NOTE — Telephone Encounter (Signed)

## 2019-04-26 ENCOUNTER — Other Ambulatory Visit: Payer: Self-pay

## 2019-04-26 ENCOUNTER — Encounter: Payer: Self-pay | Admitting: Vascular Surgery

## 2019-04-26 ENCOUNTER — Encounter (HOSPITAL_COMMUNITY): Payer: Self-pay | Admitting: Emergency Medicine

## 2019-04-26 ENCOUNTER — Ambulatory Visit (INDEPENDENT_AMBULATORY_CARE_PROVIDER_SITE_OTHER): Payer: Self-pay | Admitting: Vascular Surgery

## 2019-04-26 ENCOUNTER — Emergency Department (HOSPITAL_COMMUNITY)
Admission: EM | Admit: 2019-04-26 | Discharge: 2019-04-26 | Disposition: A | Discharge status: Home / Self Care | Payer: No Typology Code available for payment source | Attending: Emergency Medicine | Admitting: Emergency Medicine

## 2019-04-26 ENCOUNTER — Ambulatory Visit: Payer: Medicare Other | Admitting: Vascular Surgery

## 2019-04-26 ENCOUNTER — Emergency Department (HOSPITAL_COMMUNITY): Payer: No Typology Code available for payment source

## 2019-04-26 VITALS — BP 140/99 | HR 85 | Temp 97.8°F | Resp 20 | Ht 67.0 in | Wt 312.0 lb

## 2019-04-26 DIAGNOSIS — I120 Hypertensive chronic kidney disease with stage 5 chronic kidney disease or end stage renal disease: Secondary | ICD-10-CM | POA: Diagnosis not present

## 2019-04-26 DIAGNOSIS — N186 End stage renal disease: Secondary | ICD-10-CM | POA: Diagnosis not present

## 2019-04-26 DIAGNOSIS — R918 Other nonspecific abnormal finding of lung field: Secondary | ICD-10-CM | POA: Diagnosis not present

## 2019-04-26 DIAGNOSIS — L0292 Furuncle, unspecified: Secondary | ICD-10-CM | POA: Insufficient documentation

## 2019-04-26 DIAGNOSIS — Z794 Long term (current) use of insulin: Secondary | ICD-10-CM | POA: Insufficient documentation

## 2019-04-26 DIAGNOSIS — E1122 Type 2 diabetes mellitus with diabetic chronic kidney disease: Secondary | ICD-10-CM | POA: Diagnosis not present

## 2019-04-26 DIAGNOSIS — I252 Old myocardial infarction: Secondary | ICD-10-CM | POA: Diagnosis not present

## 2019-04-26 DIAGNOSIS — Z992 Dependence on renal dialysis: Secondary | ICD-10-CM | POA: Insufficient documentation

## 2019-04-26 DIAGNOSIS — Z79899 Other long term (current) drug therapy: Secondary | ICD-10-CM | POA: Diagnosis not present

## 2019-04-26 DIAGNOSIS — L02811 Cutaneous abscess of head [any part, except face]: Secondary | ICD-10-CM | POA: Diagnosis not present

## 2019-04-26 DIAGNOSIS — L089 Local infection of the skin and subcutaneous tissue, unspecified: Secondary | ICD-10-CM | POA: Insufficient documentation

## 2019-04-26 DIAGNOSIS — L0291 Cutaneous abscess, unspecified: Secondary | ICD-10-CM | POA: Diagnosis not present

## 2019-04-26 DIAGNOSIS — Z7982 Long term (current) use of aspirin: Secondary | ICD-10-CM | POA: Insufficient documentation

## 2019-04-26 LAB — LACTIC ACID, PLASMA
Lactic Acid, Venous: 2.4 mmol/L (ref 0.5–1.9)
Lactic Acid, Venous: 1.7 mmol/L (ref 0.5–1.9)

## 2019-04-26 LAB — CBC WITH DIFFERENTIAL/PLATELET
Abs Immature Granulocytes: 0.04 10*3/uL (ref 0.00–0.07)
Basophils Absolute: 0 10*3/uL (ref 0.0–0.1)
Basophils Relative: 0 %
Eosinophils Absolute: 0.2 10*3/uL (ref 0.0–0.5)
Eosinophils Relative: 2 %
HCT: 41.3 % (ref 39.0–52.0)
Hemoglobin: 13.2 g/dL (ref 13.0–17.0)
Immature Granulocytes: 1 %
Lymphocytes Relative: 14 %
Lymphs Abs: 1.1 10*3/uL (ref 0.7–4.0)
MCH: 30.9 pg (ref 26.0–34.0)
MCHC: 32 g/dL (ref 30.0–36.0)
MCV: 96.7 fL (ref 80.0–100.0)
Monocytes Absolute: 0.6 10*3/uL (ref 0.1–1.0)
Monocytes Relative: 8 %
Neutro Abs: 6.2 10*3/uL (ref 1.7–7.7)
Neutrophils Relative %: 75 %
Platelets: 246 10*3/uL (ref 150–400)
RBC: 4.27 MIL/uL (ref 4.22–5.81)
RDW: 13.8 % (ref 11.5–15.5)
WBC: 8.1 10*3/uL (ref 4.0–10.5)
nRBC: 0 % (ref 0.0–0.2)

## 2019-04-26 LAB — COMPREHENSIVE METABOLIC PANEL
ALT: 10 U/L (ref 0–44)
AST: 11 U/L — ABNORMAL LOW (ref 15–41)
Albumin: 3.5 g/dL (ref 3.5–5.0)
Alkaline Phosphatase: 123 U/L (ref 38–126)
Anion gap: 20 — ABNORMAL HIGH (ref 5–15)
BUN: 62 mg/dL — ABNORMAL HIGH (ref 6–20)
CO2: 23 mmol/L (ref 22–32)
Calcium: 7.4 mg/dL — ABNORMAL LOW (ref 8.9–10.3)
Chloride: 89 mmol/L — ABNORMAL LOW (ref 98–111)
Creatinine, Ser: 10.77 mg/dL — ABNORMAL HIGH (ref 0.61–1.24)
GFR calc Af Amer: 6 mL/min — ABNORMAL LOW (ref >60)
GFR calc non Af Amer: 5 mL/min — ABNORMAL LOW (ref >60)
Glucose, Bld: 338 mg/dL — ABNORMAL HIGH (ref 70–99)
Potassium: 4.6 mmol/L (ref 3.5–5.1)
Sodium: 132 mmol/L — ABNORMAL LOW (ref 135–145)
Total Bilirubin: 0.6 mg/dL (ref 0.3–1.2)
Total Protein: 8.5 g/dL — ABNORMAL HIGH (ref 6.5–8.1)

## 2019-04-26 LAB — POCT I-STAT EG7
Acid-Base Excess: 1 mmol/L (ref 0.0–2.0)
Bicarbonate: 25.7 mmol/L (ref 20.0–28.0)
Calcium, Ion: 0.83 mmol/L — CL (ref 1.15–1.40)
HCT: 46 % (ref 39.0–52.0)
Hemoglobin: 15.6 g/dL (ref 13.0–17.0)
O2 Saturation: 100 %
Potassium: 5.2 mmol/L — ABNORMAL HIGH (ref 3.5–5.1)
Sodium: 131 mmol/L — ABNORMAL LOW (ref 135–145)
TCO2: 27 mmol/L (ref 22–32)
pCO2, Ven: 42.4 mmHg — ABNORMAL LOW (ref 44.0–60.0)
pH, Ven: 7.391 (ref 7.250–7.430)
pO2, Ven: 175 mmHg — ABNORMAL HIGH (ref 32.0–45.0)

## 2019-04-26 LAB — CBG MONITORING, ED: Glucose-Capillary: 187 mg/dL — ABNORMAL HIGH (ref 70–99)

## 2019-04-26 MED ORDER — LIDOCAINE HCL (PF) 1 % IJ SOLN
5.0000 mL | Freq: Once | INTRAMUSCULAR | Status: AC
  Administered 2019-04-26: 20:00:00 5 mL via INTRADERMAL
  Filled 2019-04-26: qty 5

## 2019-04-26 MED ORDER — SODIUM CHLORIDE 0.9% FLUSH: 3.0000 mL | Freq: Once | INTRAVENOUS | Status: DC

## 2019-04-26 MED ORDER — CEPHALEXIN 500 MG PO CAPS: 500.0000 mg | ORAL_CAPSULE | Freq: Two times a day (BID) | ORAL | 0 refills | Status: AC

## 2019-04-26 NOTE — ED Triage Notes (Addendum)
Has an abscess on right side of head, not draining, also one on upper lip-- has 2 or 3 under panus on lower abd Dialysis M, W, F --

## 2019-04-26 NOTE — Progress Notes (Signed)
    Subjective:     [name], male   DOB: 12-23-1965, 53 y.o.   MRN: [id number]  HPI 53 year old male follows up after left basilic vein transposition fistula.  Not having any left hand symptoms.  Has had some drainage particularly from the wound above his antecubitum.  Currently on dialysis via catheter.   Review of Systems Drainage left arm incision    Objective:   Physical Exam Vitals:   04/26/19 1140  BP: (!) 140/99  Pulse: 85  Resp: 20  Temp: 97.8 F (36.6 C)  SpO2: 95%  Awake alert oriented Nonlabored respirations Strong thrill left arm there is swelling in the upper arm Left hand neuromotor intact To left upper arm incisions have minimal breakdown with drainage that does not appear to be purulent from the antecubital incision.  There is no surrounding erythema.     Assessment/plan     53 year old male on dialysis via catheter.  Recently had left second stage basilic vein fistula created.  There is a thrill although does not feel fully matured and has 2 wounds that have yet to heal so would not release him to use this yet.  Will repeat dialysis duplex to evaluate and evaluate for wound healing in a few weeks prior to allowing them to use fistula.  He demonstrates good understanding we will follow-up in a few weeks    Brandon C. Donzetta Matters, MD [facility] Office: [phone] Pager: [phone]
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
## 2019-04-26 NOTE — ED Notes (Signed)
Pt stated he does not make urine.

## 2019-04-29 ENCOUNTER — Other Ambulatory Visit: Payer: Self-pay

## 2019-04-29 ENCOUNTER — Ambulatory Visit: Payer: Medicare Other | Admitting: Pharmacist

## 2019-04-29 ENCOUNTER — Ambulatory Visit (INDEPENDENT_AMBULATORY_CARE_PROVIDER_SITE_OTHER): Payer: No Typology Code available for payment source | Admitting: Internal Medicine

## 2019-04-29 VITALS — BP 107/76 | HR 85 | Temp 98.6°F | Ht 67.0 in | Wt 315.2 lb

## 2019-04-29 DIAGNOSIS — Z992 Dependence on renal dialysis: Secondary | ICD-10-CM | POA: Diagnosis not present

## 2019-04-29 DIAGNOSIS — L0292 Furuncle, unspecified: Secondary | ICD-10-CM | POA: Diagnosis not present

## 2019-04-29 DIAGNOSIS — N186 End stage renal disease: Secondary | ICD-10-CM | POA: Diagnosis not present

## 2019-04-29 DIAGNOSIS — E1122 Type 2 diabetes mellitus with diabetic chronic kidney disease: Secondary | ICD-10-CM

## 2019-04-29 LAB — POCT GLYCOSYLATED HEMOGLOBIN (HGB A1C): Hemoglobin A1C: 9.1 % — AB (ref 4.0–5.6)

## 2019-04-29 LAB — GLUCOSE, CAPILLARY: Glucose-Capillary: 193 mg/dL — ABNORMAL HIGH (ref 70–99)

## 2019-04-29 MED ORDER — LIRAGLUTIDE 18 MG/3ML ~~LOC~~ SOPN: 0.3000 mg | PEN_INJECTOR | Freq: Every day | SUBCUTANEOUS | 0 refills | Status: DC

## 2019-04-29 NOTE — Progress Notes (Signed)
Documentation for Freestyle Libre Pro Continuous glucose monitoring Freestyle Libre Pro CGM sensor placed today. Patient was educated about wearing sensor, keeping food, activity and medication log and when to call office. Patient was educated about how to care for the sensor and not to have an MRI, CT or Diathermy while wearing the sensor. Follow up was arranged with the patient for 1 week.   Lot #: [id number] Serial #: [id number] Expiration Date: 06/10/2019  Flossie Dibble, PharmD 04/29/2019 4:21 PM.

## 2019-04-29 NOTE — Patient Instructions (Addendum)
Thank you for allowing [place] to provide your care today. Today we discussed your abscess and type II diabetes.   I have ordered the following labs for you:    I will call if any are abnormal.    Today we made the following changes to your medications:   Please START taking  liraglutide (VICTOZA) 18 mg/68mL - inject .05cc (.3mg  total) into the skin daily)  Please continue to take your sliding scale insulin. If you have symptoms of hypoglycemia such as dizziness, sudden sweating, or nausea, please check your glucose and contact the clinic for an appointment.   Please follow-up in two weeks to go over CGM and to make sure your previous abscess is healing.    I have placed a telehealth referral with our registered dietician to help with improving your diet.   Should you have any questions or concerns please call the internal medicine clinic at [phone].    Please record the time, amount and what food drinks and activities you have while wearing the continuous glucose monitor(CGM) in the folder provided.  Bring the folder with you to follow up appointments  Do not have a CT or an MRI while wearing the CGM.   Please make an appointment for 1 week with me and a doctor for the first of two CGM downloads..   You will also return in 2 weeks to have your second download and the CGM removed.
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
## 2019-04-29 NOTE — Progress Notes (Signed)
Patient was seen today in a co-visit. See documentation under Dr. Aurelio Jew visit for details.

## 2019-04-30 ENCOUNTER — Other Ambulatory Visit: Payer: Self-pay | Admitting: Internal Medicine

## 2019-04-30 DIAGNOSIS — L0292 Furuncle, unspecified: Secondary | ICD-10-CM | POA: Insufficient documentation

## 2019-04-30 NOTE — Progress Notes (Signed)
Internal Medicine Clinic Attending  Case discussed with Dr. Seawell at the time of the visit.  We reviewed the resident's history and exam and pertinent patient test results.  I agree with the assessment, diagnosis, and plan of care documented in the resident's note.  Alexander Raines, M.D., Ph.D.  

## 2019-04-30 NOTE — Assessment & Plan Note (Signed)
He is presenting after recent I&D of abscess on right side of head within the ER. He states about a week ago he had swellings appear on his head, above his lip, on his lower abdomen, and along his intergluteal cleft. They hurt when they first appeared but the pain has resolved now after taking antibiotics from the ER. The abscess on the right side of his head was drained with a small amount of fluid coming out, and just seems to be hardening since that time. He was placed on antibiotics and he thinks they have helped. The one above his lip drained previously on it's own. He denies fever, chills, nausea, diarrhea, or constipation.  These swelling appear to be multiple furuncles that recently appeared, most likely secondary to uncontrolled diabetes. Infection appears to have resolved with antibiotics. The lesion in the temporal region does not appear to need further drainage and is hard, non-infected appearing and is healing but he is at risk for repeat infection. Discussed that this may need to be removed surgically if it does not heal as it still quite large.   - f/u two weeks to reassess furuncles  - keep areas clean and dry, especially along gluteal cleft where there is small open excoriation.   Please see A&P for assessment of the patient's acute and chronic medical conditions.

## 2019-04-30 NOTE — Telephone Encounter (Signed)
Pt seen in Clinic yesterday and has been unable to pick up his medication.  Please call patient.  liraglutide ([place]) 18 MG/3ML [place], [place] [address] AT [place]

## 2019-04-30 NOTE — Telephone Encounter (Signed)
Pt stated the rx was incorrect per the pharmacy.  I called [facility] - stated sig was incorrect on Victoza. Fax from [facility]  - " pen is 0.6 mg, 1.2 mg and 1.8 mg" Please send new rx. Thanks

## 2019-04-30 NOTE — Assessment & Plan Note (Signed)
He takes novolog 2-6U per day sliding scale. He states he has been checking his glucose three-four times a day prior to meals. It runs between 180-220. He has been trying to eat a healthier diet and has been eating a lot more fruit recently. He denies increased urination or thirst.   Hemoglobin a1c today is 9.1 from 8.1 last year. This is likely falsely low because of his ESRD. Will place CGM monitor and have him follow-up in two weeks. With his morbid obesity and increased cardiovascular risk will start on very low dose of victoza .3mg  qd with consideration of his ESRD.   - hemoglobin a1c - start victoza .3mg  qd  - continue novolog 2-6U sliding scale - counseled on symptoms of hypoglycemia and to return if symptomatic - referral to [facility]na placed - also given information on diet and exercise with TIIDM  - placed on CGM monitor  - f/u two weeks

## 2019-04-30 NOTE — Telephone Encounter (Signed)
Hi Dr. Sharon Seller, I spoke to the pharmacist at [facility] to explain the dosing and they state they will process the prescription with the 0.3 mg as sent. Thank you.  [name]

## 2019-04-30 NOTE — Telephone Encounter (Signed)
He has been instructed in how to use the pen to obtain .3mg , which is five clicks. It's just not written on the pen. I am forwarding this to Dr. Maudie Mercury as well so that she can confirm.

## 2019-05-06 ENCOUNTER — Ambulatory Visit: Payer: No Typology Code available for payment source | Admitting: Pharmacist

## 2019-05-10 DIAGNOSIS — Z992 Dependence on renal dialysis: Secondary | ICD-10-CM | POA: Diagnosis not present

## 2019-05-10 DIAGNOSIS — N186 End stage renal disease: Secondary | ICD-10-CM | POA: Diagnosis not present

## 2019-05-13 ENCOUNTER — Ambulatory Visit: Payer: Medicare Other | Admitting: Pharmacist

## 2019-05-13 ENCOUNTER — Other Ambulatory Visit: Payer: Self-pay

## 2019-05-13 ENCOUNTER — Encounter: Payer: Self-pay | Admitting: Dietician

## 2019-05-13 ENCOUNTER — Telehealth: Payer: Self-pay | Admitting: Dietician

## 2019-05-13 ENCOUNTER — Ambulatory Visit: Payer: Medicare Other | Admitting: Dietician

## 2019-05-13 DIAGNOSIS — E1122 Type 2 diabetes mellitus with diabetic chronic kidney disease: Secondary | ICD-10-CM

## 2019-05-13 DIAGNOSIS — Z992 Dependence on renal dialysis: Secondary | ICD-10-CM

## 2019-05-13 MED ORDER — OZEMPIC (0.25 OR 0.5 MG/DOSE) 2 MG/1.5ML ~~LOC~~ SOPN: 0.2500 mg | PEN_INJECTOR | SUBCUTANEOUS | 0 refills | Status: DC

## 2019-05-13 MED ORDER — TRESIBA FLEXTOUCH 100 UNIT/ML ~~LOC~~ SOPN: 14.0000 [IU] | PEN_INJECTOR | Freq: Every day | SUBCUTANEOUS | 3 refills | Status: DC

## 2019-05-13 NOTE — Progress Notes (Signed)
S: Seth [name] is a 53 y.o. male reports to clinical pharmacist appointment for DM management follow up per PCP consult. Patient was accompanied by significant other who assists at home in his care.  No Known Allergies  Current Outpatient Medications:  .  amLODipine (NORVASC) 10 MG tablet, Take 1 tablet (10 mg total) by mouth daily., Disp: 90 tablet, Rfl: 1 .  aspirin EC 81 MG tablet, Take 81 mg by mouth daily., Disp: , Rfl:  .  atorvastatin (LIPITOR) 40 MG tablet, Take 1 tablet (40 mg total) by mouth daily at 6 PM., Disp: 90 tablet, Rfl: 3 .  diclofenac sodium (VOLTAREN) 1 % GEL, Apply 4 g topically 4 (four) times daily. (Patient taking differently: Apply 4 g topically daily as needed (knee pain). ), Disp: 100 g, Rfl: 2 .  ferric citrate (AURYXIA) 1 GM 210 MG(Fe) tablet, Take 420-630 mg by mouth See admin instructions. Take 3 tablets (630 mg) by mouth with meals and 2 tablets (420 mg) with snacks, Disp: , Rfl:  .  insulin degludec (TRESIBA FLEXTOUCH) 100 UNIT/ML SOPN FlexTouch Pen, Inject 0.14 mLs (14 Units total) into the skin daily., Disp: 2 pen, Rfl: 3 .  oxyCODONE-acetaminophen (PERCOCET) 10-325 MG tablet, Take 1 tablet by mouth every 6 (six) hours as needed for pain. Please refill 30 days after last RX fill. (Patient taking differently: Take 1 tablet by mouth 3 (three) times daily. Please refill 30 days after last RX fill.), Disp: 75 tablet, Rfl: 0 .  PRESCRIPTION MEDICATION, Inhale into the lungs at bedtime., Disp: , Rfl:  .  Semaglutide,0.25 or 0.5MG /DOS, (OZEMPIC, 0.25 OR 0.5 MG/DOSE,) 2 MG/1.5ML SOPN, Inject 0.25 mg into the skin once a week., Disp: 1 pen, Rfl: 0 Past Medical History:  Diagnosis Date  . Diabetes mellitus    INSULIN DEPENDENT DIABETES  . Dialysis patient ([place])   . 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 ESRD (end stage renal disease) on dialysis [facility])    [facility] M/W/F  . Hypertension   . RETINAL DETACHMENT, HX OF 06/20/2007   Qualifier: Diagnosis of  By: Seth Bergamo  RN, Seth [name]    . Sleep apnea    USES CPAP  . Type 2 diabetes mellitus ([place])    Social History   Socioeconomic History  . Marital status: Single    Spouse name: Not on file  . Number of children: Not on file  . Years of education: Not on file  . Highest education level: Not on file  Occupational History  . Not on file  Social Needs  . Financial resource strain: Not on file  . Food insecurity    Worry: Not on file    Inability: Not on file  . Transportation needs    Medical: Not on file    Non-medical: Not on file  Tobacco Use  . Smoking status: Never Smoker  . Smokeless tobacco: Never Used  Substance and Sexual Activity  . Alcohol use: No    Frequency: Never  . Drug use: Not Currently    Types: Marijuana  . Sexual activity: Not Currently  Lifestyle  . Physical activity    Days per week: Not on file    Minutes per session: Not on file  . Stress: Not on file  Relationships  . Social Herbalist on phone: Not on file    Gets together: Not on file    Attends religious service: Not on file    Active member of club or organization:  Not on file    Attends meetings of clubs or organizations: Not on file    Relationship status: Not on file  Other Topics Concern  . 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  Divorce with 5 children, 3 boys and 2 girls.  Lives in [place].    Family History  Problem Relation Age of Onset  . Diabetes Mother   . Cancer Mother   . Diabetes Sister   . Diabetes Brother    O:    Component Value Date/Time   CHOL 187 11/27/2007 2226   HDL 64 11/27/2007 2226   LDLCALC 91 11/27/2007 2226   TRIG 217 (H) 08/13/2015 0500   GLUCOSE 338 (H) 04/26/2019 1422   HGBA1C 9.1 (A) 04/29/2019 1532   HGBA1C 8.1 01/16/2017   NA 131 (L)  04/26/2019 2001   K 5.2 (H) 04/26/2019 2001   CL 89 (L) 04/26/2019 1422   CO2 23 04/26/2019 1422   BUN 62 (H) 04/26/2019 1422   CREATININE 10.77 (H) 04/26/2019 1422   CALCIUM 7.4 (L) 04/26/2019 1422   CALCIUM 7.7 (L) 08/12/2015 0738   GFRNONAA 5 (L) 04/26/2019 1422   GFRAA 6 (L) 04/26/2019 1422   AST 11 (L) 04/26/2019 1422   ALT 10 04/26/2019 1422   WBC 8.1 04/26/2019 1422   HGB 15.6 04/26/2019 2001   HCT 46.0 04/26/2019 2001   PLT 246 04/26/2019 1422   TSH 1.582 11/28/2018 0808   TSH 0.642 08/20/2013 0355   Ht Readings from Last 2 Encounters:  04/29/19 5\' 7"  (1.702 m)  04/26/19 5\' 7"  (1.702 m)   Wt Readings from Last 2 Encounters:  04/29/19 (!) 315 lb 3.2 oz (143 kg)  04/26/19 (!) 309 lb (140.2 kg)   There is no height or weight on file to calculate BMI. BP Readings from Last 3 Encounters:  04/29/19 107/76  04/26/19 (!) 124/92  04/26/19 (!) 140/99   A/P: Seth [name] Libre Results Downloaded and Reviewed  Freestyle Libre sensor placed 2 weeks  14-day BG average 180, within target range 20% of the time  Above 180 mg/dL 50% of the time  Below 70 mg/dL 0% of the time  This correlates with A1C 8% (improvement from previous A1C of 9.1%). Patient reports taking liraglutide 0.3 mg daily and insulin aspart 5-7 units TID with meals. Discussed therapy options with patient and switched to semaglutide 0.25 mg weekly and insulin degludec 14 units daily every morning. He reports he will start semaglutide on Sunday for convenience in remembering the dose (will continue liraglutide 0.3 mg daily for now). Prescriptions were sent for semaglutide and insulin degludec, and medication list was updated. Will follow up with patient in 2 weeks and notify PCP of findings.  An after visit summary was provided and patient advised to follow up if any changes in condition or questions regarding medications arise.   The patient and significant other verbalized understanding of information provided by  repeating back concepts discussed.

## 2019-05-13 NOTE — Telephone Encounter (Signed)
Mr. Seago agreed to an appointment this afternoon after he meets with Dr. Maudie Mercury. Debera Lat, RD 05/13/2019 5:05 PM.

## 2019-05-13 NOTE — Progress Notes (Signed)
Documentation: patient and significant other's questions answered about what foods to eat and drink to help with with weight loss and blood sugar control. He can follow up with dietitian at dialysis center for more support. His activity is limited by his back problems. Debera Lat, RD 05/13/2019 5:02 PM.

## 2019-05-16 ENCOUNTER — Other Ambulatory Visit: Payer: Self-pay

## 2019-05-16 DIAGNOSIS — Z992 Dependence on renal dialysis: Secondary | ICD-10-CM

## 2019-05-16 DIAGNOSIS — N186 End stage renal disease: Secondary | ICD-10-CM

## 2019-05-17 ENCOUNTER — Telehealth (HOSPITAL_COMMUNITY): Payer: Self-pay | Admitting: Rehabilitation

## 2019-05-17 NOTE — Telephone Encounter (Signed)

## 2019-05-20 ENCOUNTER — Encounter: Payer: Self-pay | Admitting: Family

## 2019-05-20 ENCOUNTER — Other Ambulatory Visit: Payer: Self-pay | Admitting: Internal Medicine

## 2019-05-20 ENCOUNTER — Ambulatory Visit (HOSPITAL_COMMUNITY)
Admission: RE | Admit: 2019-05-20 | Discharge: 2019-05-20 | Disposition: A | Discharge status: Home / Self Care | Payer: Medicare Other | Source: Ambulatory Visit | Attending: Family | Admitting: Family

## 2019-05-20 ENCOUNTER — Other Ambulatory Visit: Payer: Self-pay

## 2019-05-20 ENCOUNTER — Ambulatory Visit (INDEPENDENT_AMBULATORY_CARE_PROVIDER_SITE_OTHER): Payer: Self-pay | Admitting: Family

## 2019-05-20 VITALS — BP 139/84 | Temp 97.7°F | Resp 14 | Ht 67.0 in | Wt 315.0 lb

## 2019-05-20 DIAGNOSIS — N186 End stage renal disease: Secondary | ICD-10-CM

## 2019-05-20 DIAGNOSIS — Z992 Dependence on renal dialysis: Secondary | ICD-10-CM

## 2019-05-20 DIAGNOSIS — G9520 Unspecified cord compression: Secondary | ICD-10-CM

## 2019-05-20 DIAGNOSIS — I770 Arteriovenous fistula, acquired: Secondary | ICD-10-CM

## 2019-05-20 MED ORDER — OXYCODONE-ACETAMINOPHEN 10-325 MG PO TABS: 1.0000 | ORAL_TABLET | Freq: Four times a day (QID) | ORAL | 0 refills | Status: DC | PRN

## 2019-05-20 NOTE — Progress Notes (Signed)
CC: Follow up 2nd stage basilic vein AVF revision  History of Present Illness  Seth [name] is a 53 y.o. (07-05-1966) male who is s/p left upper arm second stage basilic vein fistula revision on 04-02-19 by Dr. Donzetta Matters. He has undergone multiple previous access procedures including a right radiocephalic AV fistula that required revision with an Artegraft jump graft.  He also recently underwent left upper extremity first stage brachiobasilic fistula placement but had no appreciable thrill in the upper arm (but did have a thrill at the elbow).  He presented that day for bilateral upper extremity venograms and to sort out what his options are for access moving forward after risks and benefits are discussed. 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  The left appeared to be a more durable option with no evidence of central venous stenosis or occlusion even though there was limited evaluation around the catheter given that he has a left IJ tunneled dialysis catheter in place.  Ultimately the left brachiobasilic fistula was accessed with ultrasound guidance and it appeared to be maturing and filled robustly into the deep brachial and central veins.  Even on reflux imaging there was no evidence of flow-limiting stenosis.  Dr. Donzetta Matters indicated that his best option would likely be converting his left brachiobasilic fistula to a second stage and if this fails potentially left upper arm graft.  He currently dialyzes via left IJ TDC on MWF. He is sleepy, states he has been awake since 4 am, he dialyzed this morning  He denies any steal type symptoms in his left upper extremity.  He is right hand dominant.    Past Medical History:  Diagnosis Date  . Diabetes mellitus    INSULIN DEPENDENT DIABETES  . Dialysis patient ([place])   . 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 ESRD (end stage renal disease) on  dialysis [facility])    [facility] M/W/F  . Hypertension   . 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 Sleep apnea    USES CPAP  . Type 2 diabetes mellitus ([place])     Social History Social History   Tobacco Use  . Smoking status: Never Smoker  . Smokeless tobacco: Never Used  Substance Use Topics  . Alcohol use: No    Frequency: Never  . Drug use: Not Currently    Types: Marijuana    Family History Family History  Problem Relation Age of Onset  . Diabetes Mother   . Cancer Mother   . Diabetes Sister   . Diabetes Brother     Surgical History Past Surgical History:  Procedure Laterality Date  . A/V FISTULAGRAM N/A 10/18/2018   Procedure: A/V FISTULAGRAM;  Surgeon: Marty Heck, MD;  Location: [facility];  Service: Cardiovascular;  Laterality: N/A;  . A/V FISTULAGRAM Left 03/28/2019   Procedure: A/V Fistulagram;  Surgeon: Marty Heck, MD;  Location: [place] [facility] [facility];  Service: Cardiovascular;  Laterality: Left;  . AV FISTULA PLACEMENT  05/03/2012   Procedure: ARTERIOVENOUS (AV) FISTULA CREATION;  Surgeon: Mal [name], MD;  Location: [facility];  Service: Vascular;  Laterality: Right;  . AV FISTULA PLACEMENT Left 10/23/2018   Procedure: ARTERIOVENOUS (AV) FISTULA CREATION;  Surgeon: Waynetta Sandy, MD;  Location: [facility];  Service: Vascular;  Laterality: Left;  . BASCILIC  VEIN TRANSPOSITION Left 04/02/2019   Procedure: BASILIC VEIN TRANSPOSITION SECOND STAGE LEFT;  Surgeon: Waynetta Sandy, MD;  Location: [facility];  Service: Vascular;  Laterality: Left;  . INSERTION OF DIALYSIS CATHETER Left 12/03/2018   Procedure: INSERTION OF DIALYSIS CATHETER;  Surgeon: Marty Heck, MD;  Location: [facility];  Service: Vascular;  Laterality: Left;  . ORIF MANDIBULAR FRACTURE N/A 11/10/2017   Procedure: OPEN REDUCTION INTERNAL FIXATION (ORIF) MANDIBULAR FRACTURE POSSIBLE MAXILLARY MANDIBULAR  FIXATION;  Surgeon: Jodi Marble, MD;  Location: [place];  Service: ENT;  Laterality: N/A;  . REVISON OF ARTERIOVENOUS FISTULA Right 05/31/2018   Procedure: REVISION OF ARTERIOVENOUS FISTULA  RIGHT ARM WITH INTERPOSITION ARTEGRAFT 6MM X 16CM GRAFT;  Surgeon: Waynetta Sandy, MD;  Location: [place];  Service: Vascular;  Laterality: Right;  . TEE WITHOUT CARDIOVERSION N/A 07/10/2015   Procedure: TRANSESOPHAGEAL ECHOCARDIOGRAM (TEE);  Surgeon: Josue Hector, MD;  Location: [facility];  Service: Cardiovascular;  Laterality: N/A;  . TEE WITHOUT CARDIOVERSION N/A 12/07/2018   Procedure: TRANSESOPHAGEAL ECHOCARDIOGRAM (TEE);  Surgeon: Acie Fredrickson Wonda Cheng, MD;  [facility];  Service: Cardiovascular;  Laterality: N/A;  . tracheostomy removal    . TRACHEOSTOMY TUBE PLACEMENT N/A 08/20/2013   Procedure: TRACHEOSTOMY Revision;  Surgeon: Melida Quitter, MD;  Location: [place];  Service: ENT;  Laterality: N/A;  . UPPER EXTREMITY VENOGRAPHY Bilateral 03/28/2019   Procedure: UPPER EXTREMITY VENOGRAPHY;  Surgeon: Marty Heck, MD;  Location: [facility];  Service: Cardiovascular;  Laterality: Bilateral;    No Known Allergies  Current Outpatient Medications  Medication Sig Dispense Refill  . amLODipine (NORVASC) 10 MG tablet Take 1 tablet (10 mg total) by mouth daily. 90 tablet 1  . aspirin EC 81 MG tablet Take 81 mg by mouth daily.    . ferric citrate (AURYXIA) 1 GM 210 MG(Fe) tablet Take 420-630 mg by mouth See admin instructions. Take 3 tablets (630 mg) by mouth with meals and 2 tablets (420 mg) with snacks    . insulin degludec (TRESIBA FLEXTOUCH) 100 UNIT/ML SOPN FlexTouch Pen Inject 0.14 mLs (14 Units total) into the skin daily. 2 pen 3  . oxyCODONE-acetaminophen (PERCOCET) 10-325 MG tablet Take 1 tablet by mouth every 6 (six) hours as needed for pain. Please refill 30 days after last RX fill. 75 tablet 0  . PRESCRIPTION MEDICATION Inhale into the lungs at bedtime.    .  Semaglutide,0.25 or 0.5MG /DOS, (OZEMPIC, 0.25 OR 0.5 MG/DOSE,) 2 MG/1.5ML SOPN Inject 0.25 mg into the skin once a week. 1 pen 0   No current facility-administered medications for this visit.      REVIEW OF SYSTEMS: see HPI for pertinent positives and negatives    PHYSICAL EXAMINATION:  Vitals:   05/20/19 1521  BP: 139/84  Resp: 14  Temp: 97.7 F (36.5 C)  TempSrc: Temporal  SpO2: 96%  Weight: (!) 315 lb (142.9 kg)  Height: 5\' 7"  (1.702 m)   Body mass index is 49.34 kg/m.  General: Morbidly obese male, occasionally falling asleep seated in w/c HEENT:  Large neck Pulmonary: Respirations are slightly labored at rest Abdomen: large, soft Musculoskeletal: There are no major deformities.   Neurologic: No focal weakness or paresthesias are detected Skin: There are no ulcer or rashes noted. Psychiatric: The patient has normal affect. Cardiovascular: There is a regular rate and rhythm. Left radial pulse is 1+ palpable  Left upper arm AVF has a brisk bruit and thrill at the distal and mid sections.  Non-Invasive Vascular Imaging  Left upper arm access Duplex  (Date: 05/20/2019):  Findings: +--------------------+----------+-----------------+--------+ AVF                 PSV (cm/s)Flow Vol (mL/min)Comments +--------------------+----------+-----------------+--------+ Native artery inflow   340           852                +--------------------+----------+-----------------+--------+ AVF Anastomosis        715                              +--------------------+----------+-----------------+--------+    +------------+----------+-------------+----------+----------------+ OUTFLOW VEINPSV (cm/s)Diameter (cm)Depth (cm)    Describe     +------------+----------+-------------+----------+----------------+ Prox UA        103        0.64        0.43                    +------------+----------+-------------+----------+----------------+ Mid UA         148         0.59        0.36   competing branch +------------+----------+-------------+----------+----------------+ Dist UA        183        0.64        0.24                    +------------+----------+-------------+----------+----------------+ AC Fossa       334        0.79        0.49                    +------------+----------+-------------+----------+----------------+  Summary: Patent arteriovenous fistula.  Inflow artery sub-optimally visualized due to depth of vessel.    Medical Decision Making  TYRELL STARCK is a 53 y.o. male who is s/p left upper arm second stage basilic vein fistula revision on 04-02-19 by Dr. Donzetta Matters. He has no steal sx's in his left UE. AVF duplex today shows that depths are shallow enough and diameters are large enough to access. May access left UE AVF as soon as possible.   Follow up with [place] as needed.    Clemon Chambers, RN, MSN, FNP-C [facility] Office: [phone]  05/20/2019, 3:39 PM  Clinic MD: Trula Slade

## 2019-05-20 NOTE — Telephone Encounter (Signed)
Refill Request   oxyCODONE-acetaminophen (PERCOCET) 10-325 MG tablet  Seth [name]  Fri Apr 26, 2019 6:21 PM  #75 filled 04/19/19 [facility] per PMP AWARE (#15 filled 04/17/19)   [place], [place] AT [place]

## 2019-05-20 NOTE — Telephone Encounter (Signed)
Please schedule an [place] appointment in the next 2 weeks for diabetes management.  Please also indicate in the appointment reason that he needs a blood toxicology screen.  Please do not tell patient he is going to get this test.  He is on oxycodone chronically.  He had a blood toxicology screen March 4 that was inappropriately negative for his oxycodone.  There is no documentation regarding this test but he did get his oxycodone on February 11 and March 10th so he could have had oxycodone in his blood on the day of the toxicology screen.

## 2019-05-21 ENCOUNTER — Telehealth: Payer: Self-pay | Admitting: Internal Medicine

## 2019-05-21 ENCOUNTER — Telehealth (HOSPITAL_COMMUNITY): Payer: Self-pay | Admitting: Pharmacist

## 2019-05-21 NOTE — Telephone Encounter (Signed)
Pt is requesting a nurse to callback on why his pain medicine was decrease [phone]

## 2019-05-21 NOTE — Telephone Encounter (Signed)
Diabetes Management Follow Up Seth [name] is a 53 y.o. male who was contacted for DM management. Identity was verified using date of birth.  Diabetes medications: Tresiba 14 units QAM and Ozempic 0.25mg  weekly - Patient reports liking the regimen switch to once daily insulin - Pt reports injecting Ozempic QHS which started on 8/9  Home BG average 180 mg/dL (correlates with A1C of around 8%) Hypoglycemia symptoms: none Hyperglycemia symptoms: none   A/P Blood glucose control: stable  Discussed with the patient that Ozempic is only once weekly and he should not inject anymore until this next Sunday. He has some health literacy concerns as I explained this to him extensively at the time of the regimen switch. He was thinking that it was similar to Victoza but I explained the difference.  Advised to contact clinic or seek medical attention if concerns or symptoms arise.  Will notify PCP of findings  The patient verbalized understanding of information provided by repeating back concepts discussed.  Follow-up 1 week  Kennon Holter, PharmD PGY1 Ambulatory Care Pharmacy Resident [employer] Phone: [phone]

## 2019-05-21 NOTE — Telephone Encounter (Signed)
rtc to pt, informed him that in visit notes pain issues and treatment need to be discussed now and at last appt it wasn't discussed, will be 8/25. He was agreeable.no discussion about toxocology

## 2019-05-28 ENCOUNTER — Emergency Department (HOSPITAL_COMMUNITY): Payer: No Typology Code available for payment source

## 2019-05-28 ENCOUNTER — Inpatient Hospital Stay (HOSPITAL_COMMUNITY)
Admission: EM | Admit: 2019-05-28 | Discharge: 2019-06-06 | DRG: 919 | Disposition: A | Discharge status: Home Health | Payer: No Typology Code available for payment source | Attending: Internal Medicine | Admitting: Internal Medicine

## 2019-05-28 ENCOUNTER — Other Ambulatory Visit: Payer: Self-pay

## 2019-05-28 ENCOUNTER — Telehealth: Payer: Self-pay | Admitting: Pharmacist

## 2019-05-28 ENCOUNTER — Encounter (HOSPITAL_COMMUNITY): Payer: Self-pay

## 2019-05-28 DIAGNOSIS — Z794 Long term (current) use of insulin: Secondary | ICD-10-CM

## 2019-05-28 DIAGNOSIS — D72829 Elevated white blood cell count, unspecified: Secondary | ICD-10-CM | POA: Diagnosis not present

## 2019-05-28 DIAGNOSIS — Z992 Dependence on renal dialysis: Secondary | ICD-10-CM

## 2019-05-28 DIAGNOSIS — E1159 Type 2 diabetes mellitus with other circulatory complications: Secondary | ICD-10-CM | POA: Diagnosis present

## 2019-05-28 DIAGNOSIS — E871 Hypo-osmolality and hyponatremia: Secondary | ICD-10-CM | POA: Diagnosis present

## 2019-05-28 DIAGNOSIS — M549 Dorsalgia, unspecified: Secondary | ICD-10-CM | POA: Diagnosis present

## 2019-05-28 DIAGNOSIS — R4020 Unspecified coma: Secondary | ICD-10-CM | POA: Diagnosis not present

## 2019-05-28 DIAGNOSIS — Z833 Family history of diabetes mellitus: Secondary | ICD-10-CM

## 2019-05-28 DIAGNOSIS — R05 Cough: Secondary | ICD-10-CM | POA: Diagnosis not present

## 2019-05-28 DIAGNOSIS — R7881 Bacteremia: Secondary | ICD-10-CM | POA: Diagnosis present

## 2019-05-28 DIAGNOSIS — F1290 Cannabis use, unspecified, uncomplicated: Secondary | ICD-10-CM | POA: Diagnosis present

## 2019-05-28 DIAGNOSIS — R4182 Altered mental status, unspecified: Secondary | ICD-10-CM | POA: Diagnosis not present

## 2019-05-28 DIAGNOSIS — T8579XA Infection and inflammatory reaction due to other internal prosthetic devices, implants and grafts, initial encounter: Principal | ICD-10-CM | POA: Diagnosis present

## 2019-05-28 DIAGNOSIS — E875 Hyperkalemia: Secondary | ICD-10-CM

## 2019-05-28 DIAGNOSIS — R404 Transient alteration of awareness: Secondary | ICD-10-CM | POA: Diagnosis not present

## 2019-05-28 DIAGNOSIS — E8770 Fluid overload, unspecified: Secondary | ICD-10-CM | POA: Diagnosis present

## 2019-05-28 DIAGNOSIS — J9602 Acute respiratory failure with hypercapnia: Secondary | ICD-10-CM

## 2019-05-28 DIAGNOSIS — E1122 Type 2 diabetes mellitus with diabetic chronic kidney disease: Secondary | ICD-10-CM

## 2019-05-28 DIAGNOSIS — G9340 Encephalopathy, unspecified: Secondary | ICD-10-CM | POA: Diagnosis present

## 2019-05-28 DIAGNOSIS — Y828 Other medical devices associated with adverse incidents: Secondary | ICD-10-CM | POA: Diagnosis present

## 2019-05-28 DIAGNOSIS — R0902 Hypoxemia: Secondary | ICD-10-CM | POA: Diagnosis not present

## 2019-05-28 DIAGNOSIS — G9349 Other encephalopathy: Secondary | ICD-10-CM | POA: Diagnosis present

## 2019-05-28 DIAGNOSIS — I959 Hypotension, unspecified: Secondary | ICD-10-CM | POA: Diagnosis not present

## 2019-05-28 DIAGNOSIS — J9622 Acute and chronic respiratory failure with hypercapnia: Secondary | ICD-10-CM | POA: Diagnosis present

## 2019-05-28 DIAGNOSIS — N186 End stage renal disease: Secondary | ICD-10-CM | POA: Diagnosis present

## 2019-05-28 DIAGNOSIS — D591 Other autoimmune hemolytic anemias: Secondary | ICD-10-CM | POA: Diagnosis present

## 2019-05-28 DIAGNOSIS — I1311 Hypertensive heart and chronic kidney disease without heart failure, with stage 5 chronic kidney disease, or end stage renal disease: Secondary | ICD-10-CM | POA: Diagnosis present

## 2019-05-28 DIAGNOSIS — Z978 Presence of other specified devices: Secondary | ICD-10-CM

## 2019-05-28 DIAGNOSIS — E11319 Type 2 diabetes mellitus with unspecified diabetic retinopathy without macular edema: Secondary | ICD-10-CM | POA: Diagnosis present

## 2019-05-28 DIAGNOSIS — R0689 Other abnormalities of breathing: Secondary | ICD-10-CM | POA: Diagnosis not present

## 2019-05-28 DIAGNOSIS — Z7982 Long term (current) use of aspirin: Secondary | ICD-10-CM

## 2019-05-28 DIAGNOSIS — J9601 Acute respiratory failure with hypoxia: Secondary | ICD-10-CM | POA: Diagnosis not present

## 2019-05-28 DIAGNOSIS — Z79899 Other long term (current) drug therapy: Secondary | ICD-10-CM

## 2019-05-28 DIAGNOSIS — Z20828 Contact with and (suspected) exposure to other viral communicable diseases: Secondary | ICD-10-CM | POA: Diagnosis present

## 2019-05-28 DIAGNOSIS — E872 Acidosis: Secondary | ICD-10-CM | POA: Diagnosis present

## 2019-05-28 DIAGNOSIS — E1110 Type 2 diabetes mellitus with ketoacidosis without coma: Secondary | ICD-10-CM

## 2019-05-28 DIAGNOSIS — E662 Morbid (severe) obesity with alveolar hypoventilation: Secondary | ICD-10-CM | POA: Diagnosis present

## 2019-05-28 DIAGNOSIS — B9561 Methicillin susceptible Staphylococcus aureus infection as the cause of diseases classified elsewhere: Secondary | ICD-10-CM | POA: Diagnosis present

## 2019-05-28 DIAGNOSIS — Z6841 Body Mass Index (BMI) 40.0 and over, adult: Secondary | ICD-10-CM

## 2019-05-28 DIAGNOSIS — M1711 Unilateral primary osteoarthritis, right knee: Secondary | ICD-10-CM | POA: Diagnosis present

## 2019-05-28 DIAGNOSIS — E1169 Type 2 diabetes mellitus with other specified complication: Secondary | ICD-10-CM | POA: Diagnosis present

## 2019-05-28 DIAGNOSIS — I252 Old myocardial infarction: Secondary | ICD-10-CM

## 2019-05-28 DIAGNOSIS — D631 Anemia in chronic kidney disease: Secondary | ICD-10-CM | POA: Diagnosis present

## 2019-05-28 DIAGNOSIS — R34 Anuria and oliguria: Secondary | ICD-10-CM | POA: Diagnosis present

## 2019-05-28 DIAGNOSIS — I152 Hypertension secondary to endocrine disorders: Secondary | ICD-10-CM | POA: Diagnosis present

## 2019-05-28 DIAGNOSIS — J9621 Acute and chronic respiratory failure with hypoxia: Secondary | ICD-10-CM | POA: Diagnosis present

## 2019-05-28 DIAGNOSIS — R0602 Shortness of breath: Secondary | ICD-10-CM | POA: Diagnosis not present

## 2019-05-28 DIAGNOSIS — E785 Hyperlipidemia, unspecified: Secondary | ICD-10-CM | POA: Diagnosis present

## 2019-05-28 DIAGNOSIS — K429 Umbilical hernia without obstruction or gangrene: Secondary | ICD-10-CM | POA: Diagnosis present

## 2019-05-28 DIAGNOSIS — E874 Mixed disorder of acid-base balance: Secondary | ICD-10-CM | POA: Diagnosis present

## 2019-05-28 DIAGNOSIS — G8929 Other chronic pain: Secondary | ICD-10-CM | POA: Diagnosis present

## 2019-05-28 LAB — COMPREHENSIVE METABOLIC PANEL
ALT: 12 U/L (ref 0–44)
AST: 16 U/L (ref 15–41)
Albumin: 3.4 g/dL — ABNORMAL LOW (ref 3.5–5.0)
Alkaline Phosphatase: 111 U/L (ref 38–126)
Anion gap: 17 — ABNORMAL HIGH (ref 5–15)
BUN: 90 mg/dL — ABNORMAL HIGH (ref 6–20)
CO2: 21 mmol/L — ABNORMAL LOW (ref 22–32)
Calcium: 7.1 mg/dL — ABNORMAL LOW (ref 8.9–10.3)
Chloride: 89 mmol/L — ABNORMAL LOW (ref 98–111)
Creatinine, Ser: 14.91 mg/dL — ABNORMAL HIGH (ref 0.61–1.24)
GFR calc Af Amer: 4 mL/min — ABNORMAL LOW (ref >60)
GFR calc non Af Amer: 3 mL/min — ABNORMAL LOW (ref >60)
Glucose, Bld: 177 mg/dL — ABNORMAL HIGH (ref 70–99)
Potassium: 7.2 mmol/L (ref 3.5–5.1)
Sodium: 127 mmol/L — ABNORMAL LOW (ref 135–145)
Total Bilirubin: 0.6 mg/dL (ref 0.3–1.2)
Total Protein: 9.1 g/dL — ABNORMAL HIGH (ref 6.5–8.1)

## 2019-05-28 LAB — CBC WITH DIFFERENTIAL/PLATELET
Abs Immature Granulocytes: 0.12 10*3/uL — ABNORMAL HIGH (ref 0.00–0.07)
Basophils Absolute: 0 10*3/uL (ref 0.0–0.1)
Basophils Relative: 0 %
Eosinophils Absolute: 0 10*3/uL (ref 0.0–0.5)
Eosinophils Relative: 0 %
HCT: 39.2 % (ref 39.0–52.0)
Hemoglobin: 12.3 g/dL — ABNORMAL LOW (ref 13.0–17.0)
Immature Granulocytes: 1 %
Lymphocytes Relative: 5 %
Lymphs Abs: 0.7 10*3/uL (ref 0.7–4.0)
MCH: 30.8 pg (ref 26.0–34.0)
MCHC: 31.4 g/dL (ref 30.0–36.0)
MCV: 98 fL (ref 80.0–100.0)
Monocytes Absolute: 1.1 10*3/uL — ABNORMAL HIGH (ref 0.1–1.0)
Monocytes Relative: 8 %
Neutro Abs: 12 10*3/uL — ABNORMAL HIGH (ref 1.7–7.7)
Neutrophils Relative %: 86 %
Platelets: 192 10*3/uL (ref 150–400)
RBC: 4 MIL/uL — ABNORMAL LOW (ref 4.22–5.81)
RDW: 14.6 % (ref 11.5–15.5)
WBC: 14 10*3/uL — ABNORMAL HIGH (ref 4.0–10.5)
nRBC: 0.1 % (ref 0.0–0.2)

## 2019-05-28 LAB — POCT I-STAT 7, (LYTES, BLD GAS, ICA,H+H)
Acid-base deficit: 6 mmol/L — ABNORMAL HIGH (ref 0.0–2.0)
Bicarbonate: 23.9 mmol/L (ref 20.0–28.0)
Calcium, Ion: 0.95 mmol/L — ABNORMAL LOW (ref 1.15–1.40)
HCT: 42 % (ref 39.0–52.0)
Hemoglobin: 14.3 g/dL (ref 13.0–17.0)
O2 Saturation: 91 %
Patient temperature: 98.6
Potassium: 7 mmol/L (ref 3.5–5.1)
Sodium: 125 mmol/L — ABNORMAL LOW (ref 135–145)
TCO2: 26 mmol/L (ref 22–32)
pCO2 arterial: 63.6 mmHg — ABNORMAL HIGH (ref 32.0–48.0)
pH, Arterial: 7.183 — CL (ref 7.350–7.450)
pO2, Arterial: 77 mmHg — ABNORMAL LOW (ref 83.0–108.0)

## 2019-05-28 LAB — SARS CORONAVIRUS 2 BY RT PCR (HOSPITAL ORDER, PERFORMED IN ~~LOC~~ HOSPITAL LAB): SARS Coronavirus 2: NEGATIVE

## 2019-05-28 LAB — CBG MONITORING, ED: Glucose-Capillary: 201 mg/dL — ABNORMAL HIGH (ref 70–99)

## 2019-05-28 MED ORDER — SODIUM CHLORIDE 0.9 % IV SOLN: 100.0000 mL | INTRAVENOUS | Status: DC | PRN

## 2019-05-28 MED ORDER — LIDOCAINE-PRILOCAINE 2.5-2.5 % EX CREA
1.0000 "application " | TOPICAL_CREAM | CUTANEOUS | Status: DC | PRN
  Filled 2019-05-28: qty 5

## 2019-05-28 MED ORDER — HEPARIN SODIUM (PORCINE) 1000 UNIT/ML DIALYSIS
5000.0000 [IU] | Freq: Once | INTRAMUSCULAR | Status: AC
  Administered 2019-05-29: 5000 [IU] via INTRAVENOUS_CENTRAL
  Filled 2019-05-28: qty 5

## 2019-05-28 MED ORDER — SODIUM BICARBONATE 8.4 % IV SOLN
50.0000 meq | Freq: Once | INTRAVENOUS | Status: AC
  Administered 2019-05-28: 50 meq via INTRAVENOUS
  Filled 2019-05-28: qty 50

## 2019-05-28 MED ORDER — NALOXONE HCL 4 MG/0.1ML NA LIQD
1.0000 | Freq: Once | NASAL | Status: AC
  Administered 2019-05-28: 1 via NASAL
  Filled 2019-05-28: qty 4

## 2019-05-28 MED ORDER — CHLORHEXIDINE GLUCONATE CLOTH 2 % EX PADS
6.0000 | MEDICATED_PAD | Freq: Every day | CUTANEOUS | Status: DC
  Administered 2019-05-30 – 2019-06-03 (×2): 6 via TOPICAL

## 2019-05-28 MED ORDER — ALTEPLASE 2 MG IJ SOLR
2.0000 mg | Freq: Once | INTRAMUSCULAR | Status: DC | PRN
  Filled 2019-05-28: qty 2

## 2019-05-28 MED ORDER — LIDOCAINE HCL (PF) 1 % IJ SOLN
5.0000 mL | INTRAMUSCULAR | Status: DC | PRN
  Filled 2019-05-28 (×2): qty 5

## 2019-05-28 MED ORDER — HEPARIN SODIUM (PORCINE) 1000 UNIT/ML DIALYSIS
1000.0000 [IU] | INTRAMUSCULAR | Status: DC | PRN
  Filled 2019-05-28 (×3): qty 1

## 2019-05-28 MED ORDER — PENTAFLUOROPROP-TETRAFLUOROETH EX AERO
1.0000 "application " | INHALATION_SPRAY | CUTANEOUS | Status: DC | PRN
  Filled 2019-05-28: qty 116

## 2019-05-28 MED ORDER — INSULIN ASPART 100 UNIT/ML IV SOLN
5.0000 [IU] | Freq: Once | INTRAVENOUS | Status: AC
  Administered 2019-05-28: 5 [IU] via INTRAVENOUS

## 2019-05-28 MED ORDER — ALBUTEROL SULFATE (2.5 MG/3ML) 0.083% IN NEBU
10.0000 mg | INHALATION_SOLUTION | Freq: Once | RESPIRATORY_TRACT | Status: DC
  Filled 2019-05-28: qty 12

## 2019-05-28 MED ORDER — CALCIUM GLUCONATE 10 % IV SOLN
1.0000 g | Freq: Once | INTRAVENOUS | Status: AC
  Administered 2019-05-28: 23:00:00 1 g via INTRAVENOUS
  Filled 2019-05-28: qty 10

## 2019-05-28 NOTE — ED Triage Notes (Signed)
Pt here from home due to AMS per family.  BIB [facility] alert on 6l o2.. per family his mental status has been declining for the past 2 weeks . Pt alert oriented x2.

## 2019-05-28 NOTE — ED Notes (Signed)
Patient transported to CT 

## 2019-05-28 NOTE — ED Notes (Signed)
Patient sister tiffany calling asking for an update on patient [phone]
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
## 2019-05-28 NOTE — ED Notes (Addendum)
Critical value potassium 7.2.... PA aware

## 2019-05-29 ENCOUNTER — Observation Stay (HOSPITAL_COMMUNITY): Payer: No Typology Code available for payment source

## 2019-05-29 DIAGNOSIS — Z79891 Long term (current) use of opiate analgesic: Secondary | ICD-10-CM | POA: Diagnosis not present

## 2019-05-29 DIAGNOSIS — G9340 Encephalopathy, unspecified: Secondary | ICD-10-CM | POA: Diagnosis not present

## 2019-05-29 DIAGNOSIS — E861 Hypovolemia: Secondary | ICD-10-CM

## 2019-05-29 DIAGNOSIS — Z452 Encounter for adjustment and management of vascular access device: Secondary | ICD-10-CM | POA: Diagnosis not present

## 2019-05-29 DIAGNOSIS — E872 Acidosis: Secondary | ICD-10-CM | POA: Diagnosis not present

## 2019-05-29 DIAGNOSIS — Z4682 Encounter for fitting and adjustment of non-vascular catheter: Secondary | ICD-10-CM | POA: Diagnosis not present

## 2019-05-29 DIAGNOSIS — E1169 Type 2 diabetes mellitus with other specified complication: Secondary | ICD-10-CM | POA: Diagnosis present

## 2019-05-29 DIAGNOSIS — D72829 Elevated white blood cell count, unspecified: Secondary | ICD-10-CM

## 2019-05-29 DIAGNOSIS — E662 Morbid (severe) obesity with alveolar hypoventilation: Secondary | ICD-10-CM

## 2019-05-29 DIAGNOSIS — J9601 Acute respiratory failure with hypoxia: Secondary | ICD-10-CM | POA: Diagnosis not present

## 2019-05-29 DIAGNOSIS — B9561 Methicillin susceptible Staphylococcus aureus infection as the cause of diseases classified elsewhere: Secondary | ICD-10-CM | POA: Diagnosis not present

## 2019-05-29 DIAGNOSIS — Z8614 Personal history of Methicillin resistant Staphylococcus aureus infection: Secondary | ICD-10-CM | POA: Diagnosis not present

## 2019-05-29 DIAGNOSIS — Z992 Dependence on renal dialysis: Secondary | ICD-10-CM | POA: Diagnosis not present

## 2019-05-29 DIAGNOSIS — E11319 Type 2 diabetes mellitus with unspecified diabetic retinopathy without macular edema: Secondary | ICD-10-CM

## 2019-05-29 DIAGNOSIS — Z978 Presence of other specified devices: Secondary | ICD-10-CM

## 2019-05-29 DIAGNOSIS — Z794 Long term (current) use of insulin: Secondary | ICD-10-CM | POA: Diagnosis not present

## 2019-05-29 DIAGNOSIS — I252 Old myocardial infarction: Secondary | ICD-10-CM | POA: Diagnosis not present

## 2019-05-29 DIAGNOSIS — Z79899 Other long term (current) drug therapy: Secondary | ICD-10-CM

## 2019-05-29 DIAGNOSIS — Z20828 Contact with and (suspected) exposure to other viral communicable diseases: Secondary | ICD-10-CM | POA: Diagnosis present

## 2019-05-29 DIAGNOSIS — J9602 Acute respiratory failure with hypercapnia: Secondary | ICD-10-CM

## 2019-05-29 DIAGNOSIS — I361 Nonrheumatic tricuspid (valve) insufficiency: Secondary | ICD-10-CM | POA: Diagnosis not present

## 2019-05-29 DIAGNOSIS — Z6841 Body Mass Index (BMI) 40.0 and over, adult: Secondary | ICD-10-CM | POA: Diagnosis not present

## 2019-05-29 DIAGNOSIS — I517 Cardiomegaly: Secondary | ICD-10-CM | POA: Diagnosis not present

## 2019-05-29 DIAGNOSIS — K0889 Other specified disorders of teeth and supporting structures: Secondary | ICD-10-CM | POA: Diagnosis not present

## 2019-05-29 DIAGNOSIS — R34 Anuria and oliguria: Secondary | ICD-10-CM | POA: Diagnosis present

## 2019-05-29 DIAGNOSIS — D591 Other autoimmune hemolytic anemias: Secondary | ICD-10-CM | POA: Diagnosis present

## 2019-05-29 DIAGNOSIS — T827XXA Infection and inflammatory reaction due to other cardiac and vascular devices, implants and grafts, initial encounter: Secondary | ICD-10-CM | POA: Diagnosis not present

## 2019-05-29 DIAGNOSIS — J9621 Acute and chronic respiratory failure with hypoxia: Secondary | ICD-10-CM | POA: Diagnosis not present

## 2019-05-29 DIAGNOSIS — Z9911 Dependence on respirator [ventilator] status: Secondary | ICD-10-CM

## 2019-05-29 DIAGNOSIS — E871 Hypo-osmolality and hyponatremia: Secondary | ICD-10-CM | POA: Diagnosis present

## 2019-05-29 DIAGNOSIS — N186 End stage renal disease: Secondary | ICD-10-CM | POA: Diagnosis not present

## 2019-05-29 DIAGNOSIS — E874 Mixed disorder of acid-base balance: Secondary | ICD-10-CM | POA: Diagnosis present

## 2019-05-29 DIAGNOSIS — M549 Dorsalgia, unspecified: Secondary | ICD-10-CM | POA: Diagnosis not present

## 2019-05-29 DIAGNOSIS — T82898A Other specified complication of vascular prosthetic devices, implants and grafts, initial encounter: Secondary | ICD-10-CM | POA: Diagnosis not present

## 2019-05-29 DIAGNOSIS — G4733 Obstructive sleep apnea (adult) (pediatric): Secondary | ICD-10-CM | POA: Diagnosis not present

## 2019-05-29 DIAGNOSIS — J9622 Acute and chronic respiratory failure with hypercapnia: Secondary | ICD-10-CM | POA: Diagnosis not present

## 2019-05-29 DIAGNOSIS — I1311 Hypertensive heart and chronic kidney disease without heart failure, with stage 5 chronic kidney disease, or end stage renal disease: Secondary | ICD-10-CM | POA: Diagnosis present

## 2019-05-29 DIAGNOSIS — E1122 Type 2 diabetes mellitus with diabetic chronic kidney disease: Secondary | ICD-10-CM | POA: Diagnosis not present

## 2019-05-29 DIAGNOSIS — G8929 Other chronic pain: Secondary | ICD-10-CM | POA: Diagnosis not present

## 2019-05-29 DIAGNOSIS — G9349 Other encephalopathy: Secondary | ICD-10-CM | POA: Diagnosis present

## 2019-05-29 DIAGNOSIS — Z8739 Personal history of other diseases of the musculoskeletal system and connective tissue: Secondary | ICD-10-CM | POA: Diagnosis not present

## 2019-05-29 DIAGNOSIS — I120 Hypertensive chronic kidney disease with stage 5 chronic kidney disease or end stage renal disease: Secondary | ICD-10-CM | POA: Diagnosis not present

## 2019-05-29 DIAGNOSIS — Y828 Other medical devices associated with adverse incidents: Secondary | ICD-10-CM | POA: Diagnosis present

## 2019-05-29 DIAGNOSIS — E875 Hyperkalemia: Secondary | ICD-10-CM

## 2019-05-29 DIAGNOSIS — E785 Hyperlipidemia, unspecified: Secondary | ICD-10-CM | POA: Diagnosis present

## 2019-05-29 DIAGNOSIS — T8579XA Infection and inflammatory reaction due to other internal prosthetic devices, implants and grafts, initial encounter: Secondary | ICD-10-CM | POA: Diagnosis present

## 2019-05-29 DIAGNOSIS — D631 Anemia in chronic kidney disease: Secondary | ICD-10-CM

## 2019-05-29 DIAGNOSIS — R7881 Bacteremia: Secondary | ICD-10-CM | POA: Diagnosis not present

## 2019-05-29 DIAGNOSIS — R4182 Altered mental status, unspecified: Secondary | ICD-10-CM | POA: Diagnosis not present

## 2019-05-29 HISTORY — DX: Acute and chronic respiratory failure with hypercapnia: J96.21

## 2019-05-29 LAB — BLOOD CULTURE ID PANEL (REFLEXED)

## 2019-05-29 LAB — RENAL FUNCTION PANEL
Albumin: 3.3 g/dL — ABNORMAL LOW (ref 3.5–5.0)
Albumin: 2.8 g/dL — ABNORMAL LOW (ref 3.5–5.0)
Anion gap: 17 — ABNORMAL HIGH (ref 5–15)
Anion gap: 18 — ABNORMAL HIGH (ref 5–15)
BUN: 92 mg/dL — ABNORMAL HIGH (ref 6–20)
BUN: 51 mg/dL — ABNORMAL HIGH (ref 6–20)
CO2: 22 mmol/L (ref 22–32)
CO2: 19 mmol/L — ABNORMAL LOW (ref 22–32)
Calcium: 7.4 mg/dL — ABNORMAL LOW (ref 8.9–10.3)
Calcium: 7.3 mg/dL — ABNORMAL LOW (ref 8.9–10.3)
Chloride: 90 mmol/L — ABNORMAL LOW (ref 98–111)
Chloride: 93 mmol/L — ABNORMAL LOW (ref 98–111)
Creatinine, Ser: 15.09 mg/dL — ABNORMAL HIGH (ref 0.61–1.24)
Creatinine, Ser: 10.91 mg/dL — ABNORMAL HIGH (ref 0.61–1.24)
GFR calc Af Amer: 4 mL/min — ABNORMAL LOW (ref >60)
GFR calc Af Amer: 5 mL/min — ABNORMAL LOW (ref >60)
GFR calc non Af Amer: 3 mL/min — ABNORMAL LOW (ref >60)
GFR calc non Af Amer: 5 mL/min — ABNORMAL LOW (ref >60)
Glucose, Bld: 81 mg/dL (ref 70–99)
Glucose, Bld: 127 mg/dL — ABNORMAL HIGH (ref 70–99)
Phosphorus: 7.3 mg/dL — ABNORMAL HIGH (ref 2.5–4.6)
Phosphorus: 6.2 mg/dL — ABNORMAL HIGH (ref 2.5–4.6)
Potassium: 6.2 mmol/L — ABNORMAL HIGH (ref 3.5–5.1)
Potassium: 5.9 mmol/L — ABNORMAL HIGH (ref 3.5–5.1)
Sodium: 129 mmol/L — ABNORMAL LOW (ref 135–145)
Sodium: 130 mmol/L — ABNORMAL LOW (ref 135–145)

## 2019-05-29 LAB — CBC
HCT: 39.3 % (ref 39.0–52.0)
HCT: 40.8 % (ref 39.0–52.0)
HCT: 36.8 % — ABNORMAL LOW (ref 39.0–52.0)
Hemoglobin: 12 g/dL — ABNORMAL LOW (ref 13.0–17.0)
Hemoglobin: 12.6 g/dL — ABNORMAL LOW (ref 13.0–17.0)
Hemoglobin: 11.5 g/dL — ABNORMAL LOW (ref 13.0–17.0)
MCH: 30 pg (ref 26.0–34.0)
MCH: 30.3 pg (ref 26.0–34.0)
MCH: 30.3 pg (ref 26.0–34.0)
MCHC: 30.5 g/dL (ref 30.0–36.0)
MCHC: 30.9 g/dL (ref 30.0–36.0)
MCHC: 31.3 g/dL (ref 30.0–36.0)
MCV: 98.3 fL (ref 80.0–100.0)
MCV: 98.1 fL (ref 80.0–100.0)
MCV: 96.8 fL (ref 80.0–100.0)
Platelets: 182 10*3/uL (ref 150–400)
Platelets: 190 10*3/uL (ref 150–400)
Platelets: 167 10*3/uL (ref 150–400)
RBC: 4 MIL/uL — ABNORMAL LOW (ref 4.22–5.81)
RBC: 4.16 MIL/uL — ABNORMAL LOW (ref 4.22–5.81)
RBC: 3.8 MIL/uL — ABNORMAL LOW (ref 4.22–5.81)
RDW: 14.6 % (ref 11.5–15.5)
RDW: 14.8 % (ref 11.5–15.5)
RDW: 14.4 % (ref 11.5–15.5)
WBC: 13.2 10*3/uL — ABNORMAL HIGH (ref 4.0–10.5)
WBC: 17.5 10*3/uL — ABNORMAL HIGH (ref 4.0–10.5)
WBC: 19.8 10*3/uL — ABNORMAL HIGH (ref 4.0–10.5)
nRBC: 0.2 % (ref 0.0–0.2)
nRBC: 0.2 % (ref 0.0–0.2)
nRBC: 0.2 % (ref 0.0–0.2)

## 2019-05-29 LAB — GLUCOSE, CAPILLARY
Glucose-Capillary: 105 mg/dL — ABNORMAL HIGH (ref 70–99)
Glucose-Capillary: 108 mg/dL — ABNORMAL HIGH (ref 70–99)
Glucose-Capillary: 111 mg/dL — ABNORMAL HIGH (ref 70–99)
Glucose-Capillary: 127 mg/dL — ABNORMAL HIGH (ref 70–99)
Glucose-Capillary: 129 mg/dL — ABNORMAL HIGH (ref 70–99)
Glucose-Capillary: 138 mg/dL — ABNORMAL HIGH (ref 70–99)
Glucose-Capillary: 89 mg/dL (ref 70–99)

## 2019-05-29 LAB — LACTIC ACID, PLASMA
Lactic Acid, Venous: 1.1 mmol/L (ref 0.5–1.9)
Lactic Acid, Venous: 1.3 mmol/L (ref 0.5–1.9)

## 2019-05-29 LAB — BLOOD GAS, ARTERIAL
Acid-base deficit: 2.7 mmol/L — ABNORMAL HIGH (ref 0.0–2.0)
Bicarbonate: 27 mmol/L (ref 20.0–28.0)
Drawn by: 350431
O2 Content: 10 L/min
O2 Saturation: 95.8 %
Patient temperature: 98.6
pCO2 arterial: 103 mmHg (ref 32.0–48.0)
pH, Arterial: 7.048 — CL (ref 7.350–7.450)
pO2, Arterial: 111 mmHg — ABNORMAL HIGH (ref 83.0–108.0)

## 2019-05-29 LAB — COMPREHENSIVE METABOLIC PANEL
ALT: 14 U/L (ref 0–44)
AST: 24 U/L (ref 15–41)
Albumin: 3.4 g/dL — ABNORMAL LOW (ref 3.5–5.0)
Alkaline Phosphatase: 103 U/L (ref 38–126)
Anion gap: 17 — ABNORMAL HIGH (ref 5–15)
BUN: 51 mg/dL — ABNORMAL HIGH (ref 6–20)
CO2: 21 mmol/L — ABNORMAL LOW (ref 22–32)
Calcium: 7.5 mg/dL — ABNORMAL LOW (ref 8.9–10.3)
Chloride: 93 mmol/L — ABNORMAL LOW (ref 98–111)
Creatinine, Ser: 10.59 mg/dL — ABNORMAL HIGH (ref 0.61–1.24)
GFR calc Af Amer: 6 mL/min — ABNORMAL LOW (ref >60)
GFR calc non Af Amer: 5 mL/min — ABNORMAL LOW (ref >60)
Glucose, Bld: 116 mg/dL — ABNORMAL HIGH (ref 70–99)
Potassium: 6.3 mmol/L (ref 3.5–5.1)
Sodium: 131 mmol/L — ABNORMAL LOW (ref 135–145)
Total Bilirubin: 1.5 mg/dL — ABNORMAL HIGH (ref 0.3–1.2)
Total Protein: 9 g/dL — ABNORMAL HIGH (ref 6.5–8.1)

## 2019-05-29 LAB — POCT I-STAT 7, (LYTES, BLD GAS, ICA,H+H)
Acid-base deficit: 3 mmol/L — ABNORMAL HIGH (ref 0.0–2.0)
Bicarbonate: 24.7 mmol/L (ref 20.0–28.0)
Calcium, Ion: 0.99 mmol/L — ABNORMAL LOW (ref 1.15–1.40)
HCT: 40 % (ref 39.0–52.0)
Hemoglobin: 13.6 g/dL (ref 13.0–17.0)
O2 Saturation: 99 %
Patient temperature: 98.6
Potassium: 5.7 mmol/L — ABNORMAL HIGH (ref 3.5–5.1)
Sodium: 131 mmol/L — ABNORMAL LOW (ref 135–145)
TCO2: 26 mmol/L (ref 22–32)
pCO2 arterial: 57 mmHg — ABNORMAL HIGH (ref 32.0–48.0)
pH, Arterial: 7.245 — ABNORMAL LOW (ref 7.350–7.450)
pO2, Arterial: 169 mmHg — ABNORMAL HIGH (ref 83.0–108.0)

## 2019-05-29 LAB — ACETAMINOPHEN LEVEL: Acetaminophen (Tylenol), Serum: <10 ug/mL — ABNORMAL LOW (ref 10–30)

## 2019-05-29 LAB — HIV ANTIBODY (ROUTINE TESTING W REFLEX): HIV Screen 4th Generation wRfx: NONREACTIVE

## 2019-05-29 LAB — ETHANOL: Alcohol, Ethyl (B): <10 mg/dL (ref <10)

## 2019-05-29 LAB — SALICYLATE LEVEL: Salicylate Lvl: <7 mg/dL (ref 2.8–30.0)

## 2019-05-29 LAB — MRSA PCR SCREENING: MRSA by PCR: POSITIVE — AB

## 2019-05-29 LAB — PHOSPHORUS: Phosphorus: 6.9 mg/dL — ABNORMAL HIGH (ref 2.5–4.6)

## 2019-05-29 LAB — OSMOLALITY: Osmolality: 301 mOsm/kg — ABNORMAL HIGH (ref 275–295)

## 2019-05-29 LAB — PROCALCITONIN: Procalcitonin: 16.5 ng/mL

## 2019-05-29 MED ORDER — SODIUM CHLORIDE 0.9 % IV SOLN
INTRAVENOUS | Status: DC
  Administered 2019-05-29: 10:00:00 via INTRAVENOUS

## 2019-05-29 MED ORDER — SODIUM CHLORIDE 0.9 % IV SOLN
1.0000 g | Freq: Once | INTRAVENOUS | Status: AC
  Administered 2019-05-29: 1 g via INTRAVENOUS
  Filled 2019-05-29: qty 1

## 2019-05-29 MED ORDER — ORAL CARE MOUTH RINSE
15.0000 mL | OROMUCOSAL | Status: DC
  Administered 2019-05-29 – 2019-05-30 (×10): 15 mL via OROMUCOSAL

## 2019-05-29 MED ORDER — VANCOMYCIN HCL 10 G IV SOLR
2500.0000 mg | Freq: Once | INTRAVENOUS | Status: AC
  Administered 2019-05-29: 2500 mg via INTRAVENOUS
  Filled 2019-05-29: qty 2500

## 2019-05-29 MED ORDER — MIDAZOLAM HCL 2 MG/2ML IJ SOLN
2.0000 mg | INTRAMUSCULAR | Status: DC | PRN
  Administered 2019-05-29: 2 mg via INTRAVENOUS

## 2019-05-29 MED ORDER — DOXERCALCIFEROL 0.5 MCG PO CAPS
1.0000 ug | ORAL_CAPSULE | ORAL | Status: DC
  Administered 2019-05-29 – 2019-06-05 (×3): 1 ug via ORAL
  Filled 2019-05-29 (×4): qty 2

## 2019-05-29 MED ORDER — CHLORHEXIDINE GLUCONATE 0.12% ORAL RINSE (MEDLINE KIT)
15.0000 mL | Freq: Two times a day (BID) | OROMUCOSAL | Status: DC
  Administered 2019-05-29: 15 mL via OROMUCOSAL

## 2019-05-29 MED ORDER — FERRIC CITRATE 1 GM 210 MG(FE) PO TABS
630.0000 mg | ORAL_TABLET | Freq: Three times a day (TID) | ORAL | Status: DC
  Administered 2019-05-30 – 2019-06-05 (×16): 630 mg via ORAL
  Filled 2019-05-29 (×21): qty 3

## 2019-05-29 MED ORDER — OXYCODONE-ACETAMINOPHEN 5-325 MG PO TABS: 1.0000 | ORAL_TABLET | Freq: Four times a day (QID) | ORAL | Status: DC | PRN

## 2019-05-29 MED ORDER — MIDAZOLAM HCL 2 MG/2ML IJ SOLN
INTRAMUSCULAR | Status: AC
  Administered 2019-05-29: 2 mg
  Filled 2019-05-29: qty 2

## 2019-05-29 MED ORDER — INSULIN ASPART 100 UNIT/ML ~~LOC~~ SOLN
0.0000 [IU] | SUBCUTANEOUS | Status: DC
  Administered 2019-05-29 (×2): 2 [IU] via SUBCUTANEOUS
  Administered 2019-05-30 (×2): 3 [IU] via SUBCUTANEOUS
  Administered 2019-05-30: 16:00:00 5 [IU] via SUBCUTANEOUS
  Administered 2019-05-31 (×2): 2 [IU] via SUBCUTANEOUS

## 2019-05-29 MED ORDER — CHLORHEXIDINE GLUCONATE CLOTH 2 % EX PADS: 6.0000 | MEDICATED_PAD | Freq: Every day | CUTANEOUS | Status: DC

## 2019-05-29 MED ORDER — ATORVASTATIN CALCIUM 40 MG PO TABS
40.0000 mg | ORAL_TABLET | Freq: Every day | ORAL | Status: DC
  Administered 2019-05-29 – 2019-06-06 (×9): 40 mg via ORAL
  Filled 2019-05-29 (×9): qty 1

## 2019-05-29 MED ORDER — FERRIC CITRATE 1 GM 210 MG(FE) PO TABS
420.0000 mg | ORAL_TABLET | Freq: Two times a day (BID) | ORAL | Status: DC | PRN
  Filled 2019-05-29: qty 2

## 2019-05-29 MED ORDER — DEXMEDETOMIDINE HCL IN NACL 400 MCG/100ML IV SOLN
0.4000 ug/kg/h | INTRAVENOUS | Status: DC
  Administered 2019-05-29: 0.5 ug/kg/h via INTRAVENOUS
  Administered 2019-05-29 (×2): 0.8 ug/kg/h via INTRAVENOUS
  Filled 2019-05-29 (×3): qty 100

## 2019-05-29 MED ORDER — ACETAMINOPHEN 650 MG RE SUPP: 650.0000 mg | Freq: Four times a day (QID) | RECTAL | Status: DC | PRN

## 2019-05-29 MED ORDER — CHLORHEXIDINE GLUCONATE CLOTH 2 % EX PADS
6.0000 | MEDICATED_PAD | Freq: Every day | CUTANEOUS | Status: AC
  Administered 2019-05-29 – 2019-05-30 (×2): 6 via TOPICAL

## 2019-05-29 MED ORDER — HEPARIN SODIUM (PORCINE) 1000 UNIT/ML IJ SOLN
INTRAMUSCULAR | Status: AC
  Filled 2019-05-29: qty 5

## 2019-05-29 MED ORDER — SENNOSIDES-DOCUSATE SODIUM 8.6-50 MG PO TABS: 1.0000 | ORAL_TABLET | Freq: Every evening | ORAL | Status: DC | PRN

## 2019-05-29 MED ORDER — OXYCODONE-ACETAMINOPHEN 10-325 MG PO TABS: 1.0000 | ORAL_TABLET | Freq: Four times a day (QID) | ORAL | Status: DC | PRN

## 2019-05-29 MED ORDER — SODIUM CHLORIDE 0.9 % IV SOLN
1.0000 g | Freq: Every day | INTRAVENOUS | Status: DC
  Filled 2019-05-29: qty 1

## 2019-05-29 MED ORDER — PANTOPRAZOLE SODIUM 40 MG IV SOLR
40.0000 mg | INTRAVENOUS | Status: DC
  Administered 2019-05-30: 40 mg via INTRAVENOUS
  Filled 2019-05-29: qty 40

## 2019-05-29 MED ORDER — MUPIROCIN 2 % EX OINT
1.0000 "application " | TOPICAL_OINTMENT | Freq: Two times a day (BID) | CUTANEOUS | Status: AC
  Administered 2019-05-29 – 2019-06-02 (×10): 1 via NASAL
  Filled 2019-05-29 (×4): qty 22

## 2019-05-29 MED ORDER — OXYCODONE HCL 5 MG PO TABS: 5.0000 mg | ORAL_TABLET | Freq: Four times a day (QID) | ORAL | Status: DC | PRN

## 2019-05-29 MED ORDER — HEPARIN SODIUM (PORCINE) 5000 UNIT/ML IJ SOLN
5000.0000 [IU] | Freq: Three times a day (TID) | INTRAMUSCULAR | Status: DC
  Administered 2019-05-29 – 2019-06-06 (×23): 5000 [IU] via SUBCUTANEOUS
  Filled 2019-05-29 (×24): qty 1

## 2019-05-29 MED ORDER — HEPARIN SODIUM (PORCINE) 1000 UNIT/ML IJ SOLN
INTRAMUSCULAR | Status: AC
  Administered 2019-05-29: 04:00:00 4000 mL
  Filled 2019-05-29: qty 4

## 2019-05-29 MED ORDER — DOCUSATE SODIUM 50 MG/5ML PO LIQD: 100.0000 mg | Freq: Two times a day (BID) | ORAL | Status: DC | PRN

## 2019-05-29 MED ORDER — FENTANYL CITRATE (PF) 100 MCG/2ML IJ SOLN: 50.0000 ug | INTRAMUSCULAR | Status: DC | PRN

## 2019-05-29 MED ORDER — MIDAZOLAM HCL 2 MG/2ML IJ SOLN
2.0000 mg | INTRAMUSCULAR | Status: DC | PRN
  Filled 2019-05-29: qty 2

## 2019-05-29 MED ORDER — ASPIRIN EC 81 MG PO TBEC
81.0000 mg | DELAYED_RELEASE_TABLET | Freq: Every day | ORAL | Status: DC
  Administered 2019-05-29 – 2019-06-06 (×9): 81 mg via ORAL
  Filled 2019-05-29 (×9): qty 1

## 2019-05-29 MED ORDER — VANCOMYCIN HCL IN DEXTROSE 1-5 GM/200ML-% IV SOLN: 1000.0000 mg | INTRAVENOUS | Status: DC

## 2019-05-29 MED ORDER — SODIUM CHLORIDE 0.9 % IV SOLN
2.0000 g | Freq: Once | INTRAVENOUS | Status: DC
  Filled 2019-05-29 (×2): qty 2

## 2019-05-29 MED ORDER — BISACODYL 10 MG RE SUPP: 10.0000 mg | Freq: Every day | RECTAL | Status: DC | PRN

## 2019-05-29 MED ORDER — ACETAMINOPHEN 325 MG PO TABS: 650.0000 mg | ORAL_TABLET | Freq: Four times a day (QID) | ORAL | Status: DC | PRN

## 2019-05-29 MED ORDER — FENTANYL CITRATE (PF) 100 MCG/2ML IJ SOLN
INTRAMUSCULAR | Status: AC
  Administered 2019-05-29: 100 ug
  Filled 2019-05-29: qty 2

## 2019-05-29 MED ORDER — FERRIC CITRATE 1 GM 210 MG(FE) PO TABS: 420.0000 mg | ORAL_TABLET | ORAL | Status: DC

## 2019-05-29 MED ORDER — PROMETHAZINE HCL 12.5 MG PO TABS
12.5000 mg | ORAL_TABLET | Freq: Four times a day (QID) | ORAL | Status: DC | PRN
  Filled 2019-05-29: qty 1

## 2019-05-29 MED ORDER — INSULIN ASPART 100 UNIT/ML ~~LOC~~ SOLN: 0.0000 [IU] | Freq: Three times a day (TID) | SUBCUTANEOUS | Status: DC

## 2019-05-29 MED FILL — Heparin Sodium (Porcine) Inj 1000 Unit/ML: INTRAMUSCULAR | Qty: 4 | Status: AC

## 2019-05-29 NOTE — Progress Notes (Addendum)
Pt. Stated that he would like Tiffany, his roommate "who is like his sister" to be his visitor for his hospital stay. Zella Ball and [facility], listed in emergency contacts, will remain his decision makers if he were unable to make decisions for himself.   Tiffany has been a visitor for him today. She stated that she was his "sister." Visitor understands visitor guidelines.   Asked for HCPA paperwork and no one can present documentation.    Asked pt. what happened to cause his hospital stay and he stated "bipap machine broke and he has been trying to get it fixed for months."

## 2019-05-29 NOTE — Plan of Care (Signed)
Weaning vent

## 2019-05-29 NOTE — Progress Notes (Signed)
Pharmacy Antibiotic Note  Seth [name] is a 53 y.o. male admitted on 05/28/2019 with AMS. He is ESRD on HD - currently receiving HD. Starting empiric antitiobics. He received cefepime 2 g IV x1 in the ED and vancomycin 2500 mg IV x1 in HD.    Plan: -Vancomycin 2500 mg IV x1 then 1 g IV qHD -F/u continuation of cefepime -Monitor HD schedule    Temp (24hrs), Avg:100.3 F (37.9 C), Min:100.3 F (37.9 C), Max:100.3 F (37.9 C)  Recent Labs  Lab 05/28/19 2157  WBC 14.0*  CREATININE 14.91*     Antimicrobials this admission: 8/19 vancomycin > 8/19 cefepime >  Dose adjustments this admission: N/A  Microbiology results: 8/18 Covid-19: neg 8/18 blood cx:   Harvel Quale 05/29/2019 12:55 AM
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
## 2019-05-29 NOTE — Procedures (Signed)
Intubation Procedure Note Seth [name] [id number] Feb 18, 1966  Procedure: Intubation Indications: Respiratory insufficiency  Procedure Details Consent: Unable to obtain consent because of emergent medical necessity. Time Out: Verified patient identification, verified procedure, site/side was marked, verified correct patient position, special equipment/implants available, medications/allergies/relevent history reviewed, required imaging and test results available.  Performed  Maximum sterile technique was used including antiseptics, cap, gloves, hand hygiene and mask.  MAC and 4 ETT size 8    Evaluation Hemodynamic Status: BP stable throughout; O2 sats: transiently fell during during procedure Patient's Current Condition: stable Complications: No apparent complications Patient did tolerate procedure well. Chest X-ray ordered to verify placement.  CXR: pending.   [name] [name] 05/29/2019

## 2019-05-29 NOTE — Progress Notes (Addendum)
Pharmacy Antibiotic Note  Seth [name] is a 53 y.o. male with PMH ESRD w/ HD on MWF, IDDM, HTN, OSA, h/o MSSA bacteremia  admitted on 05/28/2019 with altered mental status. At presentation, patient's O2 sats were 70% with a WBC of 14 and an elevated temperature. Pharmacy has been consulted for cefepime dosing.  WBC has elevated to 19.8. Patient had a Tmax of 100.3. Cefepime was ordered in the ED on 8/18 but no dose charted. Vancomycin given in ED but now discontinued. HD planned for today.  Plan: - Give Cefepime IV 1g now due to no dose chartered yesterday - Schedule cefepime IV 1g q24 hours until patient on stable HD schedule - PCT ordered to assess need for antibiotics - follow patient's clinical status, PCT, WBCs, temp, and cultures - F/U on HD schedule  Weight: (!) 315 lb 4.1 oz (143 kg)  Temp (24hrs), Avg:99.5 F (37.5 C), Min:98.6 F (37 C), Max:100.3 F (37.9 C)  Recent Labs  Lab 05/28/19 2157 05/29/19 0054 05/29/19 0055 05/29/19 0415 05/29/19 0422 05/29/19 0731  WBC 14.0*  --  13.2* 17.5*  --  19.8*  CREATININE 14.91* 15.09*  --  10.59*  --  10.91*  LATICACIDVEN  --   --   --   --  1.1 1.3    Estimated Creatinine Clearance: 10.5 mL/min (A) (by C-G formula based on SCr of 10.91 mg/dL (H)).    No Known Allergies  Antimicrobials this admission: 08/19 cefepime >>  08/18 vancomycin x1  Microbiology results: 08/19 MRSA PCR: positive 08/19 Bcxs: sent 8/18 COVID: negative  Thank you for allowing pharmacy to be a part of this patient's care.  Sherren Kerns, PharmD PGY1 Acute Care Pharmacy Resident [phone] 05/29/2019 10:23 AM

## 2019-05-29 NOTE — Progress Notes (Addendum)
Paged by floor nurse stating that patient is unresponsive. Went to bedside to evaluate. Could hear patient with audible stertor/snoring respiratory breaths from distance and on auscultation could appreciate rhonchi. Patient was reactive to noxious stimuli in left and right thigh. Corneal reflex absent, absent bilateral pupillary reflex, does not blink to threat.  Ordered stat abg: ph 7.048, pco2 103, po2 111, bicarb 27- primary respiratory acidosis with secondary metabolic acidosis 2 narcan given with minimal change   Called pccm Dr. Richardean Canal who accepted transfer to icu, patient signed out to critical care team.   Patient appears to have a progressively worsening mentation throughout the night. According to ED PA sign out the patient was speaking in full sentences and was oriented. As ED provider was signing out patient she mentioned that the patient was OTF. IMTS assessed patient in HD at approximately 12:30am during which time the patient was responsive to verbal stimuli, was attempting to answer questions but would doze back to sleep. He was able to constrict pupils, and was responsive to commands. IMTS was not paged regarding worsening mentation or changes in his respiratory status till patient reached floor. Patient's worsening acute encephalopathy is likely being driven largely by acute hypercapneic respiratory failure.   Lars Mage, MD Internal Medicine PGY3 Pager:[phone] 05/29/2019, 4:54 AM

## 2019-05-29 NOTE — Progress Notes (Addendum)
0350 MD at bedside to assess patient.Patient oxygen saturations down 70-80 on 6 liters nasal canula.Orders received for arterial blood gas and respiratory paged.[id number] Respiratory therapy and lab at bedside.Rapid response paged to come see patient.Patient placed on non rebreather mask.Patient continues to remain unresponsive.59 narcan given IV and 0424 second dose of narcan given IV with no response.0426 Patient to transfer to ICU. [phone] Patient transferred to unit 2 MW bed 03.
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
## 2019-05-29 NOTE — Progress Notes (Signed)
Patient arrived to unit on strecher unresponsive with snoring respirations.Patient only responded to painful stimuli.Assisted patient to bed by nursing staff and MD call to come assess patient.

## 2019-05-29 NOTE — Consult Note (Signed)
[facility] Renal Consultation Note  Requesting MD: Joni Reining DO Indication for Consultation:  ESRD  Chief complaint: altered mental status   HPI:  Seth [name] is a 53 y.o. male with a history of end-stage renal disease on hemodialysis at [facility] Monday Wednesday Friday, diabetes mellitus, and hypertension, and sleep apnea who presented to the ER with altered mental status.  Per charting family stated that pt was confused and depressed that he may have taken something and they report that he is not compliant with dialysis.  (contrary to what patient reported and also contrary to his outpatient unit reporting as below).  He was hyperkalemic with an initial potassium of 7.2.  He was initially on supplemental oxygen and was to start bipap per nursing.  After HD he was altered and then was emergently intubated.  The patient received dialysis overnight per the on-call provider and had 2 kg net UF.  Spoke with outpatient HD RN and his last treatment was on 8/17 and he completed a full treatment.  HD unit reports that he is usually early and doesn't miss treatment often.  Departure weight was 138.0 kg after pulling 3.9 kg with 8/17 treatment.  They have not started using his AVF but do have orders to start - they have been using his catheter.    Dialysis orders:  4 hours EDW 138.5 kg  BF 400; DF 800 Bath: 2K, Ca 2.5 Heparin 4500 units load and 1000 units hourly  hectoral 1 mcg each tx Normally 50 mg benadryl PO (itching), 650 mg tylenol (for cramps) and 1500 mg tums (for calcium) each treatment Not on iron or ESA Nephrologist - Dr. Holley Raring   PMHx:   Past Medical History:  Diagnosis Date  . Diabetes mellitus    INSULIN DEPENDENT DIABETES  . Dialysis patient ([place])   . 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 ESRD (end stage renal disease) on dialysis [facility])    [facility] M/W/F  . Hypertension   . 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 Sleep apnea    USES CPAP  . Type 2 diabetes mellitus ([place])     Past Surgical History:  Procedure Laterality Date  . A/V FISTULAGRAM N/A 10/18/2018   Procedure: A/V FISTULAGRAM;  Surgeon: Marty Heck, MD;  Location: [place] CV LAB;  Service: Cardiovascular;  Laterality: N/A;  . A/V FISTULAGRAM Left 03/28/2019   Procedure: A/V Fistulagram;  Surgeon: Marty Heck, MD;  Location: [place] CV LAB;  Service: Cardiovascular;  Laterality: Left;  . AV FISTULA PLACEMENT  05/03/2012   Procedure: ARTERIOVENOUS (AV) FISTULA CREATION;  Surgeon: Mal [name], MD;  Location: [place];  Service: Vascular;  Laterality: Right;  . AV FISTULA PLACEMENT Left 10/23/2018   Procedure: ARTERIOVENOUS (AV) FISTULA CREATION;  Surgeon: Waynetta Sandy, MD;  Location: [place];  Service: Vascular;  Laterality: Left;  . BASCILIC VEIN TRANSPOSITION Left 04/02/2019   Procedure: BASILIC VEIN TRANSPOSITION SECOND STAGE LEFT;  Surgeon: Waynetta Sandy, MD;  Location: [place];  Service: Vascular;  Laterality: Left;  . INSERTION OF DIALYSIS CATHETER Left 12/03/2018   Procedure: INSERTION OF DIALYSIS CATHETER;  Surgeon: Marty Heck, MD;  Location: [employer];  Service: Vascular;  Laterality: Left;  . ORIF MANDIBULAR FRACTURE N/A 11/10/2017   Procedure: OPEN REDUCTION INTERNAL FIXATION (ORIF) MANDIBULAR FRACTURE POSSIBLE MAXILLARY MANDIBULAR FIXATION;  Surgeon: Jodi Marble, MD;  Location: [employer];  Service: ENT;  Laterality: N/A;  . REVISON OF ARTERIOVENOUS FISTULA Right 05/31/2018   Procedure: REVISION OF ARTERIOVENOUS FISTULA  RIGHT ARM WITH INTERPOSITION ARTEGRAFT 6MM X 16CM GRAFT;  Surgeon: Waynetta Sandy, MD;  Location: [place];  Service: Vascular;  Laterality: Right;  . TEE WITHOUT CARDIOVERSION N/A 07/10/2015   Procedure: TRANSESOPHAGEAL ECHOCARDIOGRAM (TEE);  Surgeon: Josue Hector, MD;  Location: [place];  Service: Cardiovascular;  Laterality: N/A;  .  TEE WITHOUT CARDIOVERSION N/A 12/07/2018   Procedure: TRANSESOPHAGEAL ECHOCARDIOGRAM (TEE);  Surgeon: Acie Fredrickson Wonda Cheng, MD;  Location: [facility] ENDOSCOPY;  Service: Cardiovascular;  Laterality: N/A;  . tracheostomy removal    . TRACHEOSTOMY TUBE PLACEMENT N/A 08/20/2013   Procedure: TRACHEOSTOMY Revision;  Surgeon: Melida Quitter, MD;  Location: [place];  Service: ENT;  Laterality: N/A;  . UPPER EXTREMITY VENOGRAPHY Bilateral 03/28/2019   Procedure: UPPER EXTREMITY VENOGRAPHY;  Surgeon: Marty Heck, MD;  Location: [place] CV LAB;  Service: Cardiovascular;  Laterality: Bilateral;    Family Hx:  Family History  Problem Relation Age of Onset  . Diabetes Mother   . Cancer Mother   . Diabetes Sister   . Diabetes Brother     Social History:  reports that he has never smoked. He has never used smokeless tobacco. He reports previous drug use. Drug: Marijuana. He reports that he does not drink alcohol.  Allergies: No Known Allergies  Medications: Prior to Admission medications   Medication Sig Start Date End Date Taking? Authorizing Provider  amLODipine (NORVASC) 10 MG tablet Take 1 tablet (10 mg total) by mouth daily. 01/23/19  Yes Annia Belt, MD  aspirin EC 81 MG tablet Take 81 mg by mouth daily.   Yes [provider]  atorvastatin (LIPITOR) 40 MG tablet Take 40 mg by mouth daily.   Yes [provider]  diclofenac sodium (VOLTAREN) 1 % GEL Apply 4 g topically 4 (four) times daily.   Yes [provider]  ferric citrate (AURYXIA) 1 GM 210 MG(Fe) tablet Take 420-630 mg by mouth See admin instructions. Take 3 tablets (630 mg) by mouth with meals and 2 tablets (420 mg) with snacks   Yes [provider]  insulin aspart (NOVOLOG FLEXPEN) 100 UNIT/ML FlexPen Inject 0-20 Units into the skin 3 (three) times daily with meals.   Yes [provider]  senna (SENOKOT) 8.6 MG tablet Take 1 tablet by mouth daily.   Yes [provider]  insulin  degludec (TRESIBA FLEXTOUCH) 100 UNIT/ML SOPN FlexTouch Pen Inject 0.14 mLs (14 Units total) into the skin daily. 05/13/19   Mitzi Hansen, MD  oxyCODONE-acetaminophen (PERCOCET) 10-325 MG tablet Take 1 tablet by mouth every 6 (six) hours as needed for pain. Please refill 30 days after last RX fill. Patient taking differently: Take 1 tablet by mouth 3 (three) times daily as needed for pain. For pain 05/20/19   Bartholomew Crews, MD  PRESCRIPTION MEDICATION Inhale into the lungs at bedtime.    [provider]  Semaglutide,0.25 or 0.5MG /DOS, (OZEMPIC, 0.25 OR 0.5 MG/DOSE,) 2 MG/1.5ML SOPN Inject 0.25 mg into the skin once a week. 05/13/19   Mitzi Hansen, MD    I have reviewed the patient's current medications.  Labs:  BMP Latest Ref Rng & Units 05/29/2019 05/29/2019 05/29/2019  Glucose 70 - 99 mg/dL - 116(H) 81  BUN 6 - 20 mg/dL - 51(H) 92(H)  Creatinine 0.61 - 1.24 mg/dL - 10.59(H) 15.09(H)  Sodium 135 - 145 mmol/L 131(L) 131(L) 129(L)  Potassium 3.5 - 5.1 mmol/L 5.7(H) 6.3(HH) 6.2(H)  Chloride 98 - 111 mmol/L - 93(L) 90(L)  CO2 22 - 32 mmol/L - 21(L) 22  Calcium 8.9 - 10.3 mg/dL - 7.5(L) 7.4(L)    Urinalysis    Component Value Date/Time   COLORURINE AMBER (A) 08/19/2013 2254   APPEARANCEUR TURBID (A) 08/19/2013 2254   LABSPEC >1.030 (H) 08/19/2013 2254   PHURINE 5.0 08/19/2013 2254   GLUCOSEU NEGATIVE 08/19/2013 2254   HGBUR LARGE (A) 08/19/2013 2254   HGBUR moderate 06/20/2008 1410   BILIRUBINUR SMALL (A) 08/19/2013 2254   KETONESUR 15 (A) 08/19/2013 2254   PROTEINUR 100 (A) 08/19/2013 2254   UROBILINOGEN 0.2 08/19/2013 2254   NITRITE POSITIVE (A) 08/19/2013 2254   LEUKOCYTESUR SMALL (A) 08/19/2013 2254     ROS:   Unable to obtain secondary to intubated  Physical Exam: Vitals:   05/29/19 0600 05/29/19 0726  BP: 106/64 133/64  Pulse: 80   Resp: (!) 21 (!) 24  Temp:    SpO2: 100%      General: adult male intubated - 5 PEEP and FIO2 70 HEENT: NCAT Eyes:  tracks; EOMI Neck:  increased neck circumference Heart: RRR no rub Lungs: clear to auscultation anteriorly Abdomen: obese habitus; softly distended/ NT Extremities: no pitting edema Skin: no rash on extremities exposed Neuro: on sedation but starting to nod appropriately and follow commands  Assessment/Plan:  # ESRD  - normally on HD per MWF schedule; HD again today, 8/19, to optimize   # Hyperkalemia - HD again today per his schedule  # Acute hypoxic respiratory failure  - on mechanical ventilation  - optimize volume status with HD to assist with extubation   # AMS - CT head no acute intracranial process - Per primary team    # HTN  - acceptable control   # Hyperphosphatemia  - HD today  - note ordered Kellie Moor 05/29/2019, 7:29 AM

## 2019-05-29 NOTE — Progress Notes (Signed)
[facility] Progress Note Patient Name: Seth [name] DOB: 1965/12/07 MRN: [id number]   Date of Service  05/29/2019  HPI/Events of Note  Pt with blood cultures + for MSSA  eICU Interventions  Pharmacist instructed to d/c Vancomycin but continue Cefepime for now.        [name] 05/29/2019, 9:40 PM

## 2019-05-29 NOTE — Progress Notes (Signed)
Patient seen and examined.  Awake and alert on vent.  We will do another round of dialysis today then would proceed with extubation and liberal use of BiPAP.  Unless obtunded, further ABGs are not needed.  Erskine Emery MD PCCM

## 2019-05-29 NOTE — Progress Notes (Addendum)
PHARMACY - PHYSICIAN COMMUNICATION CRITICAL VALUE ALERT - BLOOD CULTURE IDENTIFICATION (BCID)  Seth [name] is an 53 y.o. male who presented to [facility] on 05/28/2019 with a chief complaint of AMS and acute on chronic respiratory failure.   Assessment:  Patient has a history of MSSA bacteremia. Current blood cultures (2 out of 3 bottles- one aerobic, one anaerobic) positive for MSSA again. Patient has leukocytosis up to 19.8, elevated PCT, MRSA pcr is positive, and Tmax of 100.3. Patient has ESRD on HD.   Name of physician (or Provider) Contacted: Ogan  Current antibiotics: Vancomycin x1 and Cefepime  Changes to prescribed antibiotics recommended:  No further Vancomycin. Continue Cefepime for now per Dr. Lucile Shutters -- discussed narrowing to Cefazolin but wants to continue broader coverage for now.   Results for orders placed or performed during the hospital encounter of 11/28/18  Blood Culture ID Panel (Reflexed) (Collected: 11/28/2018  8:08 AM)  Result Value Ref Range   Enterococcus species NOT DETECTED NOT DETECTED   Listeria monocytogenes NOT DETECTED NOT DETECTED   Staphylococcus species DETECTED (A) NOT DETECTED   Staphylococcus aureus (BCID) DETECTED (A) NOT DETECTED   Methicillin resistance NOT DETECTED NOT DETECTED   Streptococcus species NOT DETECTED NOT DETECTED   Streptococcus agalactiae NOT DETECTED NOT DETECTED   Streptococcus pneumoniae NOT DETECTED NOT DETECTED   Streptococcus pyogenes NOT DETECTED NOT DETECTED   Acinetobacter baumannii NOT DETECTED NOT DETECTED   Enterobacteriaceae species NOT DETECTED NOT DETECTED   Enterobacter cloacae complex NOT DETECTED NOT DETECTED   Escherichia coli NOT DETECTED NOT DETECTED   Klebsiella oxytoca NOT DETECTED NOT DETECTED   Klebsiella pneumoniae NOT DETECTED NOT DETECTED   Proteus species NOT DETECTED NOT DETECTED   Serratia marcescens NOT DETECTED NOT DETECTED   Haemophilus influenzae NOT DETECTED NOT DETECTED   Neisseria  meningitidis NOT DETECTED NOT DETECTED   Pseudomonas aeruginosa NOT DETECTED NOT DETECTED   Candida albicans NOT DETECTED NOT DETECTED   Candida glabrata NOT DETECTED NOT DETECTED   Candida krusei NOT DETECTED NOT DETECTED   Candida parapsilosis NOT DETECTED NOT DETECTED   Candida tropicalis NOT DETECTED NOT DETECTED    Sloan Leiter, PharmD, BCPS, BCCCP Clinical Pharmacist Please refer to [facility] for [place] numbers 05/29/2019  9:09 PM

## 2019-05-29 NOTE — Procedures (Signed)
Extubation Procedure Note  Patient Details:   Name: Seth [name] DOB: [birth year] MRN: [id number]   Airway Documentation:    Vent end date: 05/29/19 Vent end time: 1606   Evaluation  O2 sats: stable throughout Complications: No apparent complications Patient did tolerate procedure well. Bilateral Breath Sounds: Clear   Yes.  Pt extubated to 6 l/m [name] per Dr. Thompson Caul order.  Earney Navy 05/29/2019, 4:11 PM

## 2019-05-29 NOTE — Progress Notes (Signed)
RT came to place patient on BIPAP per MD order. Upon entering room patient being transported to dialysis. RN stated to wait on BIPAP due to patient being taken to dialysis. Patient transported on nasal cannula.

## 2019-05-29 NOTE — Progress Notes (Signed)
Pharmacy Student Learning Progress Note - for learning purposes ONLY.   HPI: 57 YOM who presented to ED w/ AMS for ~1 wk.   PMH: ESRD on HD, DM, NSTEMI, HTN, h/o MSSA bacteremia   Dialysis orders: 400/800 x 4 hrs, Bath: 2K, Ca 2.5. Hep-4500 load & 1000/hr EDW 138.5 kg, hectoral 1 mcg each tx (calcimimetic) Not on iron or ESA   Plan:  Renal- lytes were trending well until 8/20 labs.  Auryxia restarted d/t high Phos. Monitor HD-Trial AVF for HD tomorrow - remove TDC if successful.    Renal:  . Renal fxn: 8/21 o Baseline SCr - SCr trend: up (10.91>>9.52>>11.64) o Baseline BUN - BUN trend - up (51>>47>>73) o UOP trend: - Diuresis (diuretics, IVF): - Weight trend:    HD: MWF - 2 sessions on 8/19. Another today (waiting results) - Vascular access: Cath - Tolerability: yes . Duration: 3.5  . BFR/DFW: 350/800 . BP: stable . UFnet = 3934>>1700 . Wt change: -2.9kg >> 1.7kg . Pt status: stable  Electrolytes:  - Na - t-down (130>>133>>131) - K - wnl now (5.4>5.0) - Kayexalate 30g x1 8/20 - Ca (osteo) - Mg - Phos (osteo)  Renal Osteo: Auryxia restarted d/t high Phos. Cal should be fine. Marland Kitchen PTH - Hectorol 70mcg qMWF - Ca - worsened (7.2) - cCa - 8.0 - ignore d/t h/o calciphylaxis o Alb - low & t-down (2.7) . Phos - worsening (7.5>>9.1) - Auryxia 630 TID 8/20>> . Vit D . Renal Diet: 1282mL fluid restriction . Renal does adjustments:  IP meds - Cal-glu x1 8/18 Urgent use. Hectorol (doxecalciferol). NaHCO3 x1 8/18  Heme/Onc: Not iron or ESA therapy. (besides Auryxia) . Anemia: o Iron panel: (n/a)  - Fe - TSAT - Ferr o CBC: (8/21) - Hgb 12.0 . Fe therapy . ESA . PRBC  Anticoag: aspirin 81  ID: PCR(+) MSSA. WBC 19.8. Afebrile but T-up. Abx narrowed to Cefazolin. Dose appropriate  Cefepime 1g 8/19>>8/20 Cefazolin 1g 8/20>>  CV: atorva-40 . HTN: o BP trend: o PTA meds:  Endo: glu up (191)  GI/Nutrition: protonix . Diet:  Neuro:  Pulm: albuterol.  intubated  PTA Meds: Ozempic, atorva-40, Auryxia (ferric citrate) 1g 210mg  Fe tab, percocet

## 2019-05-29 NOTE — Significant Event (Signed)
Rapid Response Event Note  Overview:Called d/t pt with snoring respirations and responsive only to painful stimuli, SpO2-80s. Time Called: 0405 Arrival Time: 0406 Event Type: Respiratory  Initial Focused Assessment: Pt laying in bed with snoring respirations, unresponsive to sternal rub, pupils-1. Lungs with rhonchi t/o. Pt not protecting airway. HR-99, BP-122/76, RR-36, SpO2-55% on 6L [place]. Pt placed on NRB with SpO2 increasing to 96%. Narcan 0.4mg  given and pt opened eyes with repeated stimulation. Narcan repeated and pt became minimally responsive to deep sternal rub and opened eyes, but would not track, move all extremities, or follow commands.  Interventions: CBG-105 NRB Narcan 0.4mg  IV X 2 ABG-7.048/103/111/27 Emergent transfer to ICU(59m03) and intubated.  Plan of Care (if not transferred):  Event Summary: Name of Physician Notified: Chundi, MD at (PTA RRT)  Name of Consulting Physician Notified: Oletta Darter, PCCM MD at [phone]  Outcome: Transferred (Comment)(2M03)  Event End Time: [id number]  Dillard Essex

## 2019-05-30 ENCOUNTER — Inpatient Hospital Stay (HOSPITAL_COMMUNITY): Payer: No Typology Code available for payment source

## 2019-05-30 DIAGNOSIS — J9621 Acute and chronic respiratory failure with hypoxia: Secondary | ICD-10-CM

## 2019-05-30 DIAGNOSIS — Z8739 Personal history of other diseases of the musculoskeletal system and connective tissue: Secondary | ICD-10-CM

## 2019-05-30 DIAGNOSIS — K0889 Other specified disorders of teeth and supporting structures: Secondary | ICD-10-CM

## 2019-05-30 DIAGNOSIS — I120 Hypertensive chronic kidney disease with stage 5 chronic kidney disease or end stage renal disease: Secondary | ICD-10-CM

## 2019-05-30 DIAGNOSIS — R7881 Bacteremia: Secondary | ICD-10-CM

## 2019-05-30 DIAGNOSIS — B9561 Methicillin susceptible Staphylococcus aureus infection as the cause of diseases classified elsewhere: Secondary | ICD-10-CM

## 2019-05-30 DIAGNOSIS — Z8614 Personal history of Methicillin resistant Staphylococcus aureus infection: Secondary | ICD-10-CM

## 2019-05-30 DIAGNOSIS — E1122 Type 2 diabetes mellitus with diabetic chronic kidney disease: Secondary | ICD-10-CM

## 2019-05-30 DIAGNOSIS — G9340 Encephalopathy, unspecified: Secondary | ICD-10-CM

## 2019-05-30 DIAGNOSIS — J9622 Acute and chronic respiratory failure with hypercapnia: Secondary | ICD-10-CM

## 2019-05-30 DIAGNOSIS — Z8619 Personal history of other infectious and parasitic diseases: Secondary | ICD-10-CM

## 2019-05-30 LAB — HEPATITIS B E ANTIGEN: Hep B E Ag: NEGATIVE

## 2019-05-30 LAB — GLUCOSE, CAPILLARY
Glucose-Capillary: 85 mg/dL (ref 70–99)
Glucose-Capillary: 106 mg/dL — ABNORMAL HIGH (ref 70–99)
Glucose-Capillary: 169 mg/dL — ABNORMAL HIGH (ref 70–99)
Glucose-Capillary: 227 mg/dL — ABNORMAL HIGH (ref 70–99)
Glucose-Capillary: 192 mg/dL — ABNORMAL HIGH (ref 70–99)

## 2019-05-30 LAB — ECHOCARDIOGRAM COMPLETE: Weight: 4938.3 oz

## 2019-05-30 LAB — RENAL FUNCTION PANEL
Albumin: 2.7 g/dL — ABNORMAL LOW (ref 3.5–5.0)
Anion gap: 17 — ABNORMAL HIGH (ref 5–15)
BUN: 47 mg/dL — ABNORMAL HIGH (ref 6–20)
CO2: 20 mmol/L — ABNORMAL LOW (ref 22–32)
Calcium: 7.9 mg/dL — ABNORMAL LOW (ref 8.9–10.3)
Chloride: 96 mmol/L — ABNORMAL LOW (ref 98–111)
Creatinine, Ser: 9.52 mg/dL — ABNORMAL HIGH (ref 0.61–1.24)
GFR calc Af Amer: 6 mL/min — ABNORMAL LOW (ref >60)
GFR calc non Af Amer: 6 mL/min — ABNORMAL LOW (ref >60)
Glucose, Bld: 105 mg/dL — ABNORMAL HIGH (ref 70–99)
Phosphorus: 7.5 mg/dL — ABNORMAL HIGH (ref 2.5–4.6)
Potassium: 5.4 mmol/L — ABNORMAL HIGH (ref 3.5–5.1)
Sodium: 133 mmol/L — ABNORMAL LOW (ref 135–145)

## 2019-05-30 LAB — HEPATITIS B SURFACE ANTIGEN: Hepatitis B Surface Ag: NEGATIVE

## 2019-05-30 LAB — HEPATITIS B SURFACE ANTIBODY,QUALITATIVE: Hep B S Ab: REACTIVE

## 2019-05-30 LAB — HEPATITIS B CORE ANTIBODY, TOTAL: Hep B Core Total Ab: NEGATIVE

## 2019-05-30 MED ORDER — ORAL CARE MOUTH RINSE
15.0000 mL | Freq: Two times a day (BID) | OROMUCOSAL | Status: DC
  Administered 2019-05-30 – 2019-06-05 (×10): 15 mL via OROMUCOSAL

## 2019-05-30 MED ORDER — DOCUSATE SODIUM 50 MG/5ML PO LIQD: 100.0000 mg | Freq: Two times a day (BID) | ORAL | Status: DC | PRN

## 2019-05-30 MED ORDER — CHLORHEXIDINE GLUCONATE CLOTH 2 % EX PADS
6.0000 | MEDICATED_PAD | Freq: Every day | CUTANEOUS | Status: DC
  Administered 2019-05-31: 6 via TOPICAL

## 2019-05-30 MED ORDER — CEFAZOLIN SODIUM-DEXTROSE 1-4 GM/50ML-% IV SOLN
1.0000 g | Freq: Every day | INTRAVENOUS | Status: DC
  Administered 2019-05-30 – 2019-06-04 (×6): 1 g via INTRAVENOUS
  Filled 2019-05-30 (×5): qty 50

## 2019-05-30 MED ORDER — SODIUM POLYSTYRENE SULFONATE 15 GM/60ML PO SUSP
30.0000 g | Freq: Once | ORAL | Status: AC
  Administered 2019-05-30: 30 g via ORAL
  Filled 2019-05-30: qty 120

## 2019-05-30 NOTE — Progress Notes (Signed)
  Echocardiogram 2D Echocardiogram has been performed.  Seth [name] 05/30/2019, 10:14 AM

## 2019-05-30 NOTE — Progress Notes (Addendum)
11:45am: CSW spoke with patient at bedside to discuss information obtained from [place]. Patient reports that his CPAP machine is broken into several pieces and that he does not know where they are. Patient reports that he cannot go home without and new machine stating it is his "lifeline." CSW raised the possibility of utilizing his insurance benefits to obtain a new machine. CSW will speak with RN CM for assistance and will assist with obtaining a new machine for this patient.  10am: CSW spoke with Caryl Pina at the [facility] in the oxygen department to discuss this patent's CPAP machine being broken. Caryl Pina stated that per the patient's chart he spoke to a [place] staff member on 05/22/2019 regarding instructions on how to get a new machine. Caryl Pina stated that with the patient's permission a friend or family member can go to the [place] and exchange it. CSW will inform patient of that information.  Madilyn Fireman, MSW, LCSW-A Clinical Social Worker [facility] [phone]

## 2019-05-30 NOTE — Progress Notes (Signed)
RT went to place patient on BiPAP.  Patient awake not ready to go to bed.  RT instructed patient to have RN call when patient is ready.  Patient showed understanding.

## 2019-05-30 NOTE — Progress Notes (Signed)
[facility] Progress Note  Name: Seth [name] MRN: [id number] DOB: 1966-08-01  Chief Complaint:  Altered mental status   Subjective:  Patient has blood cultures positive for MSSA and has been on cefepime for same.  Feels ok today.  Extubated.  He has been on BIPAP overnight.  States that his CPAP machine at home has been broken for 8 months and he has contacted the [place] to get a new one.    Review of systems:  Denies n/v Denies shortness of breath  Denies chest pain   ---------------------- Background on consult:  Seth [name] is a 53 y.o. male with a history of end-stage renal disease on hemodialysis at [facility] Monday Wednesday Friday, diabetes mellitus, and hypertension, and sleep apnea who presented to the ER with altered mental status.  Per charting family stated that pt was confused and depressed that he may have taken something and they report that he is not compliant with dialysis.  (contrary to what patient reported and also contrary to his outpatient unit reporting as below).  He was hyperkalemic with an initial potassium of 7.2.  He was initially on supplemental oxygen and was to start bipap per nursing.  After HD he was altered and then was emergently intubated.  The patient received dialysis overnight per the on-call provider and had 2 kg net UF.  Spoke with outpatient HD RN and his last treatment was on 8/17 and he completed a full treatment.  HD unit reports that he is usually early and doesn't miss treatment often.  Departure weight was 138.0 kg after pulling 3.9 kg with 8/17 treatment.  They have not started using his AVF but do have orders to start - they have been using his catheter.     Intake/Output Summary (Last 24 hours) at 05/30/2019 0540 Last data filed at 05/30/2019 0500 Gross per 24 hour  Intake 446.03 ml  Output 2934 ml  Net -2487.97 ml    Vitals:  Vitals:   05/30/19 0200 05/30/19 0300 05/30/19 0400 05/30/19 0500  BP:   (!)  120/53   Pulse: 88 90 92 92  Resp: 18 (!) 21 (!) 22 20  Temp:   100.2 F (37.9 C)   TempSrc:   Axillary   SpO2: 95% 99% 99% 99%  Weight:         Physical Exam:  General adult male in bed in no acute distress; on BIPAP - removed for exam/interview per nursing and patient comfortable HEENT normocephalic atraumatic extraocular movements intact sclera anicteric Neck increased neck circumference; trachea midline Lungs clear to auscultation bilaterally normal work of breathing at rest  Heart tachycardia, no rub  Abdomen soft nontender nondistended Extremities no lower extremity edema  Psych normal mood and affect   Medications reviewed   Labs:  BMP Latest Ref Rng & Units 05/29/2019 05/29/2019 05/29/2019  Glucose 70 - 99 mg/dL 127(H) - 116(H)  BUN 6 - 20 mg/dL 51(H) - 51(H)  Creatinine 0.61 - 1.24 mg/dL 10.91(H) - 10.59(H)  Sodium 135 - 145 mmol/L 130(L) 131(L) 131(L)  Potassium 3.5 - 5.1 mmol/L 5.9(H) 5.7(H) 6.3(HH)  Chloride 98 - 111 mmol/L 93(L) - 93(L)  CO2 22 - 32 mmol/L 19(L) - 21(L)  Calcium 8.9 - 10.3 mg/dL 7.3(L) - 7.5(L)    Dialysis orders:  4 hours EDW 138.5 kg  BF 400; DF 800 Bath: 2K, Ca 2.5 Heparin 4500 units load and 1000 units hourly  hectoral 1 mcg each tx Normally 50 mg benadryl  PO (itching), 650 mg tylenol (for cramps) and 1500 mg tums (for calcium) each treatment Not on iron or ESA Nephrologist - Dr. Holley Raring    Assessment/Plan:   # ESRD  - normally on HD per MWF schedule - renal panel today  - Will attempt to access his AVF tomorrow, 8/21 and if able to do so successfully will plan for removal of tunneled catheter soon   # MSSA bacteremia  - Will attempt to access his AVF tomorrow, 8/21 and if able to do so successfully will plan for removal of tunneled catheter soon   # Hyperkalemia - s/p HD - update renal panel   # Acute hypoxic respiratory failure  - off mech vent and on CPAP currently  - optimize volume status with HD   # AMS - CT head  no acute intracranial process - Per primary team  - improved    # HTN  - acceptable control   # Hyperphosphatemia  - continue HD  - note ordered Kellie Moor, MD 05/30/2019 5:40 AM
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
## 2019-05-30 NOTE — Consult Note (Signed)
[facility] for Infectious Disease    Date of Admission:  05/28/2019     Total days of antibiotics                Reason for Consult: MSSA Bacteremia    Referring Provider: Tivis Ringer / [employer] Primary Care Provider: Mitzi Hansen, MD   Assessment/Plan:  [name] is a 53 year old male with MSSA bacteremia with likely source of infection being his dialysis catheter with course complicated by obesity hypoventilation syndrome and acute on chronic respiratory failure with hypoxia and hypercapnia initially requiring mechanical ventilation and now extubated and resolving. Antibiotic therapy appropriately narrowed to Cefazolin. Will check TTE and will likely need TEE to rule out endocarditis. There are plans for line removal tomorrow. He will need at minimum a 48 hour line holiday pending interrogation of his fistula. Will repeat blood cultures once line is removed. He will require prolonged therapy with Ancef that can be accomplished with dialysis.  1. Continue Cefazolin 2. Check TTE. 3. Remove dialysis catheter when able. 4. Dialysis and ESRD care per nephrology.  5. Diabetes management per primary team. 6. Acute respiratory failure per CCM and primary team.    Active Problems:   Obesity hypoventilation syndrome ([place])   Hypertension associated with diabetes ([place])   End stage renal disease on dialysis ([place])   Controlled type 2 diabetes mellitus with chronic kidney disease on chronic dialysis ([name])   Hyperkalemia   Bacteremia due to methicillin susceptible Staphylococcus aureus (MSSA)   Acute encephalopathy   Endotracheal tube present   Acute on chronic respiratory failure with hypoxia and hypercapnia ([place])   . albuterol  10 mg Nebulization Once  . aspirin EC  81 mg Oral Daily  . atorvastatin  40 mg Oral Daily  . Chlorhexidine Gluconate Cloth  6 each Topical Q0600  . Chlorhexidine Gluconate Cloth  6 each Topical Q0600  . doxercalciferol  1 mcg Oral Q M,W,F-HD  .  ferric citrate  630 mg Oral TID WC  . heparin  5,000 Units Subcutaneous Q8H  . insulin aspart  0-15 Units Subcutaneous Q4H  . mouth rinse  15 mL Mouth Rinse 10 times per day  . mupirocin ointment  1 application Nasal BID  . sodium polystyrene  30 g Oral Once     HPI: Seth [name] is a 53 y.o. male with previous medical history of type 2 diabetes, sleep apnea, hypertension, end-stage renal disease on dialysis (Monday/Wednesday/Friday), and previous discitis in 2016 MSSA bacteremia in February 2020 associated with end-stage renal disease (also noted to have MRSA and C albicans on culture tip) admitted with altered mental status of 2-week duration with family concern for depression and possibly suicidal ideation following placement of mother in a skilled nursing facility.   EMS noted O2 saturation at 70% prior to arrival. Low grade temperature of 100.3 on admission with WBC count of 17.5. Chest x-ray with cardiomegaly and vascular congestion and CT of the head was without intracranial abnormality. He was hyperkalemic and hypervolemic despite completion of dialysis the prior day and taken for hemodialysis. Admitted with acute encephalopathy with concern for hypercapnic and hypoxic respiratory failure. Course has been complicated by continued worsening mental status and levels of consciousness. Critical care medicine consulted and requiring mechanical ventilation.   Blood cultures are positive for MSSA. Initially placed on Vancomycin and Cefepime and now has been narrowed to Cefazolin. Temperature overnight of 101 and leukocytosis with WBC count of 19.8. He has a  hemodialysis cathter located in his left internal jugular. Per critical care there is plan for interrogation and potential line removal tomorrow per nephrology. He is now extubated and maintaining his airway.    Review of Systems: Review of Systems  Constitutional: Negative for chills, fever and weight loss.  Respiratory: Negative for  cough, shortness of breath and wheezing.   Cardiovascular: Negative for chest pain and leg swelling.  Gastrointestinal: Negative for abdominal pain, constipation, diarrhea, nausea and vomiting.  Skin: Negative for rash.     Past Medical History:  Diagnosis Date  . Diabetes mellitus    INSULIN DEPENDENT DIABETES  . Dialysis patient ([place])   . 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 ESRD (end stage renal disease) on dialysis [facility])    [facility] M/W/F  . Hypertension   . 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 Sleep apnea    USES CPAP  . Type 2 diabetes mellitus ([name])     Social History   Tobacco Use  . Smoking status: Never Smoker  . Smokeless tobacco: Never Used  Substance Use Topics  . Alcohol use: No    Frequency: Never  . Drug use: Not Currently    Types: Marijuana    Family History  Problem Relation Age of Onset  . Diabetes Mother   . Cancer Mother   . Diabetes Sister   . Diabetes Brother     No Known Allergies  OBJECTIVE: Blood pressure (!) 120/53, pulse 93, temperature 99.1 F (37.3 C), temperature source Oral, resp. rate 20, weight (!) 140 kg, SpO2 (!) 87 %.  Physical Exam Constitutional:      General: He is not in acute distress.    Appearance: He is well-developed. He is obese. He is ill-appearing.  HENT:     Head:     Comments: Poor dentition.  Cardiovascular:     Rate and Rhythm: Regular rhythm. Tachycardia present.     Heart sounds: Heart sounds are distant.     Comments: Bruit and thrill located in left upper arm fistula.  Pulmonary:     Effort: Pulmonary effort is normal.     Breath sounds: Normal breath sounds.  Skin:    General: Skin is warm and dry.  Neurological:     Mental Status: He is alert and oriented to person, place, and time.  Psychiatric:        Mood and Affect: Mood normal.        Behavior: Behavior normal.     Lab Results Lab Results  Component Value Date   WBC 19.8 (H)  05/29/2019   HGB 11.5 (L) 05/29/2019   HCT 36.8 (L) 05/29/2019   MCV 96.8 05/29/2019   PLT 167 05/29/2019    Lab Results  Component Value Date   CREATININE 9.52 (H) 05/30/2019   BUN 47 (H) 05/30/2019   NA 133 (L) 05/30/2019   K 5.4 (H) 05/30/2019   CL 96 (L) 05/30/2019   CO2 20 (L) 05/30/2019    Lab Results  Component Value Date   ALT 14 05/29/2019   AST 24 05/29/2019   ALKPHOS 103 05/29/2019   BILITOT 1.5 (H) 05/29/2019     Microbiology: Recent Results (from the past 240 hour(s))  SARS Coronavirus 2 [facility] order, Performed in [facility] hospital lab) Nasopharyngeal Nasopharyngeal Swab     Status: None   Collection Time: 05/28/19  9:14 PM   Specimen: Nasopharyngeal Swab  Result  Value Ref Range Status   SARS Coronavirus 2 NEGATIVE NEGATIVE Final    Comment: (NOTE) If result is NEGATIVE SARS-CoV-2 target nucleic acids are NOT DETECTED. The SARS-CoV-2 RNA is generally detectable in upper and lower  respiratory specimens during the acute phase of infection. The lowest  concentration of SARS-CoV-2 viral copies this assay can detect is 250  copies / mL. A negative result does not preclude SARS-CoV-2 infection  and should not be used as the sole basis for treatment or other  patient management decisions.  A negative result may occur with  improper specimen collection / handling, submission of specimen other  than nasopharyngeal swab, presence of viral mutation(s) within the  areas targeted by this assay, and inadequate number of viral copies  (<250 copies / mL). A negative result must be combined with clinical  observations, patient history, and epidemiological information. If result is POSITIVE SARS-CoV-2 target nucleic acids are DETECTED. The SARS-CoV-2 RNA is generally detectable in upper and lower  respiratory specimens dur ing the acute phase of infection.  Positive  results are indicative of active infection with SARS-CoV-2.  Clinical  correlation with patient  history and other diagnostic information is  necessary to determine patient infection status.  Positive results do  not rule out bacterial infection or co-infection with other viruses. If result is PRESUMPTIVE POSTIVE SARS-CoV-2 nucleic acids MAY BE PRESENT.   A presumptive positive result was obtained on the submitted specimen  and confirmed on repeat testing.  While 2019 novel coronavirus  (SARS-CoV-2) nucleic acids may be present in the submitted sample  additional confirmatory testing may be necessary for epidemiological  and / or clinical management purposes  to differentiate between  SARS-CoV-2 and other Sarbecovirus currently known to infect humans.  If clinically indicated additional testing with an alternate test  methodology [phone]) is advised. The SARS-CoV-2 RNA is generally  detectable in upper and lower respiratory sp ecimens during the acute  phase of infection. The expected result is Negative. Fact Sheet for Patients:  StrictlyIdeas.no Fact Sheet for Healthcare Providers: BankingDealers.co.za This test is not yet approved or cleared by the [place] FDA and has been authorized for detection and/or diagnosis of SARS-CoV-2 by FDA under an Emergency Use Authorization (EUA).  This EUA will remain in effect (meaning this test can be used) for the duration of the COVID-19 declaration under Section 564(b)(1) of the Act, 21 U.S.C. section 360bbb-3(b)(1), unless the authorization is terminated or revoked sooner. Performed at [facility], [place] [address], [place], [place] [address]   Blood culture (routine x 2)     Status: Abnormal (Preliminary result)   Collection Time: 05/29/19  4:15 AM   Specimen: BLOOD RIGHT HAND  Result Value Ref Range Status   Specimen Description BLOOD RIGHT HAND  Final   Special Requests   Final    BOTTLES DRAWN AEROBIC ONLY Blood Culture results may not be optimal due to an inadequate volume  of blood received in culture bottles   Culture  Setup Time   Final    AEROBIC BOTTLE ONLY GRAM POSITIVE COCCI CRITICAL RESULT CALLED TO, READ BACK BY AND VERIFIED WITH: Dion Body [facility] 05/29/19 2107 JDW    Culture (A)  Final    STAPHYLOCOCCUS AUREUS SUSCEPTIBILITIES TO FOLLOW Performed at [facility], [place] [address] [place],  [address]    Report Status PENDING  Incomplete  Blood Culture ID Panel (Reflexed)     Status: Abnormal   Collection Time: 05/29/19  4:15 AM  Result Value Ref Range Status   Enterococcus species NOT DETECTED NOT DETECTED Final   Listeria monocytogenes NOT DETECTED NOT DETECTED Final   Staphylococcus species DETECTED (A) NOT DETECTED Final    Comment: CRITICAL RESULT CALLED TO, READ BACK BY AND VERIFIED WITH: J MILLEN PHARMD 05/29/19 2107 JDW    Staphylococcus aureus (BCID) DETECTED (A) NOT DETECTED Final    Comment: Methicillin (oxacillin) susceptible Staphylococcus aureus (MSSA). Preferred therapy is anti staphylococcal beta lactam antibiotic (Cefazolin or Nafcillin), unless clinically contraindicated. CRITICAL RESULT CALLED TO, READ BACK BY AND VERIFIED WITH: Dion Body [facility] 05/29/19 2107 JDW    Methicillin resistance NOT DETECTED NOT DETECTED Final   Streptococcus species NOT DETECTED NOT DETECTED Final   Streptococcus agalactiae NOT DETECTED NOT DETECTED Final   Streptococcus pneumoniae NOT DETECTED NOT DETECTED Final   Streptococcus pyogenes NOT DETECTED NOT DETECTED Final   Acinetobacter baumannii NOT DETECTED NOT DETECTED Final   Enterobacteriaceae species NOT DETECTED NOT DETECTED Final   Enterobacter cloacae complex NOT DETECTED NOT DETECTED Final   Escherichia coli NOT DETECTED NOT DETECTED Final   Klebsiella oxytoca NOT DETECTED NOT DETECTED Final   Klebsiella pneumoniae NOT DETECTED NOT DETECTED Final   Proteus species NOT DETECTED NOT DETECTED Final   Serratia marcescens NOT DETECTED NOT DETECTED Final   Haemophilus influenzae NOT  DETECTED NOT DETECTED Final   Neisseria meningitidis NOT DETECTED NOT DETECTED Final   Pseudomonas aeruginosa NOT DETECTED NOT DETECTED Final   Candida albicans NOT DETECTED NOT DETECTED Final   Candida glabrata NOT DETECTED NOT DETECTED Final   Candida krusei NOT DETECTED NOT DETECTED Final   Candida parapsilosis NOT DETECTED NOT DETECTED Final   Candida tropicalis NOT DETECTED NOT DETECTED Final    Comment: Performed at [facility], [place] [address] [place], [place] [address]  MRSA PCR Screening     Status: Abnormal   Collection Time: 05/29/19  4:17 AM   Specimen: Nasal Mucosa; Nasopharyngeal  Result Value Ref Range Status   MRSA by PCR POSITIVE (A) NEGATIVE Final    Comment:        The GeneXpert MRSA Assay (FDA approved for NASAL specimens only), is one component of a comprehensive MRSA colonization surveillance program. It is not intended to diagnose MRSA infection nor to guide or monitor treatment for MRSA infections. RESULT CALLED TO, READ BACK BY AND VERIFIED WITH: PHILLIPS,T RN [phone] 05/29/2019 MITCHELL,L Performed at [facility], [place] [address] [place], [place] [address]   Blood culture (routine x 2)     Status: Abnormal (Preliminary result)   Collection Time: 05/29/19  4:23 AM   Specimen: BLOOD  Result Value Ref Range Status   Specimen Description BLOOD LEFT ANTECUBITAL  Final   Special Requests   Final    BOTTLES DRAWN AEROBIC AND ANAEROBIC Blood Culture results may not be optimal due to an inadequate volume of blood received in culture bottles   Culture  Setup Time   Final    GRAM POSITIVE COCCI CRITICAL VALUE NOTED.  VALUE IS CONSISTENT WITH PREVIOUSLY REPORTED AND CALLED VALUE. IN BOTH AEROBIC AND ANAEROBIC BOTTLES Performed at [facility], [place] [address], [place], [place] [address]    Culture STAPHYLOCOCCUS AUREUS (A)  Final   Report Status PENDING  Incomplete     Terri Piedra, NP [facility] [phone] Pager  05/30/2019  9:40 AM

## 2019-05-30 NOTE — Progress Notes (Signed)
This patient has chronic respiratory insufficiency secondary to obesity hypoventilation syndrome and renal failure. This has resulted in recurrent admissions. He will need a noninvasive ventilator to reduce his chances of readmission. BiPAP is not a viable alternative at this time.

## 2019-05-30 NOTE — Progress Notes (Signed)
Patient discussed with PCCM Dr. Tamala Julian. Patient stable for transfer out of the ICU. IMTS will assume care on 8/21 at [facility], DO IM PGY-3

## 2019-05-31 DIAGNOSIS — Z6841 Body Mass Index (BMI) 40.0 and over, adult: Secondary | ICD-10-CM

## 2019-05-31 LAB — CBC
HCT: 38.4 % — ABNORMAL LOW (ref 39.0–52.0)
Hemoglobin: 12 g/dL — ABNORMAL LOW (ref 13.0–17.0)
MCH: 30.3 pg (ref 26.0–34.0)
MCHC: 31.3 g/dL (ref 30.0–36.0)
MCV: 97 fL (ref 80.0–100.0)
Platelets: 191 10*3/uL (ref 150–400)
RBC: 3.96 MIL/uL — ABNORMAL LOW (ref 4.22–5.81)
RDW: 14.6 % (ref 11.5–15.5)
WBC: 11 10*3/uL — ABNORMAL HIGH (ref 4.0–10.5)
nRBC: 0.3 % — ABNORMAL HIGH (ref 0.0–0.2)

## 2019-05-31 LAB — RENAL FUNCTION PANEL
Albumin: 2.7 g/dL — ABNORMAL LOW (ref 3.5–5.0)
Anion gap: 19 — ABNORMAL HIGH (ref 5–15)
BUN: 73 mg/dL — ABNORMAL HIGH (ref 6–20)
CO2: 19 mmol/L — ABNORMAL LOW (ref 22–32)
Calcium: 7.2 mg/dL — ABNORMAL LOW (ref 8.9–10.3)
Chloride: 93 mmol/L — ABNORMAL LOW (ref 98–111)
Creatinine, Ser: 11.64 mg/dL — ABNORMAL HIGH (ref 0.61–1.24)
GFR calc Af Amer: 5 mL/min — ABNORMAL LOW (ref >60)
GFR calc non Af Amer: 4 mL/min — ABNORMAL LOW (ref >60)
Glucose, Bld: 191 mg/dL — ABNORMAL HIGH (ref 70–99)
Phosphorus: 9.1 mg/dL — ABNORMAL HIGH (ref 2.5–4.6)
Potassium: 5 mmol/L (ref 3.5–5.1)
Sodium: 131 mmol/L — ABNORMAL LOW (ref 135–145)

## 2019-05-31 LAB — CULTURE, BLOOD (ROUTINE X 2)

## 2019-05-31 LAB — GLUCOSE, CAPILLARY
Glucose-Capillary: 130 mg/dL — ABNORMAL HIGH (ref 70–99)
Glucose-Capillary: 131 mg/dL — ABNORMAL HIGH (ref 70–99)
Glucose-Capillary: 143 mg/dL — ABNORMAL HIGH (ref 70–99)
Glucose-Capillary: 209 mg/dL — ABNORMAL HIGH (ref 70–99)
Glucose-Capillary: 231 mg/dL — ABNORMAL HIGH (ref 70–99)

## 2019-05-31 MED ORDER — LIDOCAINE HCL (PF) 2 % IJ SOLN
10.0000 mL | Freq: Once | INTRAMUSCULAR | Status: DC
  Filled 2019-05-31 (×2): qty 10

## 2019-05-31 MED ORDER — DOXERCALCIFEROL 0.5 MCG PO CAPS
ORAL_CAPSULE | ORAL | Status: AC
  Filled 2019-05-31: qty 2

## 2019-05-31 MED ORDER — ALBUMIN HUMAN 25 % IV SOLN
25.0000 g | Freq: Once | INTRAVENOUS | Status: AC
  Administered 2019-05-31: 25 g via INTRAVENOUS

## 2019-05-31 MED ORDER — INSULIN ASPART 100 UNIT/ML ~~LOC~~ SOLN
0.0000 [IU] | Freq: Every day | SUBCUTANEOUS | Status: DC
  Administered 2019-05-31 – 2019-06-05 (×2): 2 [IU] via SUBCUTANEOUS

## 2019-05-31 MED ORDER — HEPARIN SODIUM (PORCINE) 1000 UNIT/ML IJ SOLN
3.8000 mL | Freq: Once | INTRAMUSCULAR | Status: AC
  Administered 2019-05-31: 3800 [IU] via INTRAVENOUS

## 2019-05-31 MED ORDER — ALBUMIN HUMAN 25 % IV SOLN
INTRAVENOUS | Status: AC
  Filled 2019-05-31: qty 100

## 2019-05-31 MED ORDER — HEPARIN SODIUM (PORCINE) 1000 UNIT/ML IJ SOLN
INTRAMUSCULAR | Status: AC
  Filled 2019-05-31: qty 4

## 2019-05-31 MED ORDER — INSULIN ASPART 100 UNIT/ML ~~LOC~~ SOLN
0.0000 [IU] | Freq: Three times a day (TID) | SUBCUTANEOUS | Status: DC
  Administered 2019-05-31: 7 [IU] via SUBCUTANEOUS
  Administered 2019-05-31: 3 [IU] via SUBCUTANEOUS
  Administered 2019-06-01 – 2019-06-02 (×5): 4 [IU] via SUBCUTANEOUS
  Administered 2019-06-02: 3 [IU] via SUBCUTANEOUS
  Administered 2019-06-03: 4 [IU] via SUBCUTANEOUS
  Administered 2019-06-04: 3 [IU] via SUBCUTANEOUS
  Administered 2019-06-04 – 2019-06-05 (×3): 7 [IU] via SUBCUTANEOUS
  Administered 2019-06-06: 3 [IU] via SUBCUTANEOUS

## 2019-05-31 NOTE — Progress Notes (Addendum)
[facility] Progress Note  Name: Seth [name] MRN: [id number] DOB: 12-05-1965  Chief Complaint:  Altered mental status   Subjective:  Seen on dialysis this AM at 10:10 am  BP 94/56 and HR 80; UF was on hold earlier due to hypotension.   Two nurses attempted to stick AVF and were unable to do so.  He is receiving dialysis via tunneled left IJ catheter.  Blood flow increased to 400 ml/min 2 hours into treatment as not using AVF.  Added 30 minutes to treatment time. Patient on 4.5 liters oxygen.  Albumin x 1 given to improve UF.  We discussed recommendations for catheter removal for line holiday.   Review of systems:   Denies n/v Denies shortness of breath  Denies chest pain   ---------------------- Background on consult:  Seth [name] is a 53 y.o. male with a history of end-stage renal disease on hemodialysis at [facility] Monday Wednesday Friday, diabetes mellitus, and hypertension, and sleep apnea who presented to the ER with altered mental status.  Per charting family stated that pt was confused and depressed that he may have taken something and they report that he is not compliant with dialysis.  (contrary to what patient reported and also contrary to his outpatient unit reporting as below).  He was hyperkalemic with an initial potassium of 7.2.  He was initially on supplemental oxygen and was to start bipap per nursing.  After HD he was altered and then was emergently intubated.  The patient received dialysis overnight per the on-call provider and had 2 kg net UF.  Spoke with outpatient HD RN and his last treatment was on 8/17 and he completed a full treatment.  HD unit reports that he is usually early and doesn't miss treatment often.  Departure weight was 138.0 kg after pulling 3.9 kg with 8/17 treatment.  They have not started using his AVF but do have orders to start - they have been using his catheter.  Per last vascular note 05/20/19 may access LUE AVF as soon  as possible.    Intake/Output Summary (Last 24 hours) at 05/31/2019 0949 Last data filed at 05/30/2019 2230 Gross per 24 hour  Intake 1360 ml  Output -  Net 1360 ml    Vitals:  Vitals:   05/31/19 0820 05/31/19 0825 05/31/19 0900 05/31/19 0930  BP: 125/61 94/61 (!) 70/46 (!) 98/34  Pulse: 75 78 76 93  Resp: 17 18 18 15   Temp:      TempSrc:      SpO2: 95% 97% 93% 93%  Weight:         Physical Exam:   General adult male in bed in no acute distress HEENT normocephalic atraumatic extraocular movements intact sclera anicteric Neck increased neck circumference; trachea midline Lungs clear to auscultation bilaterally normal work of breathing at rest  Heart tachycardia, no rub  Abdomen soft nontender obese habitus  Extremities no lower extremity edema  Psych normal mood and affect Access: LIJ tunneled catheter; LUE AVF with bruit and thrill     Medications reviewed   Labs:  BMP Latest Ref Rng & Units 05/31/2019 05/30/2019 05/29/2019  Glucose 70 - 99 mg/dL 191(H) 105(H) 127(H)  BUN 6 - 20 mg/dL 73(H) 47(H) 51(H)  Creatinine 0.61 - 1.24 mg/dL 11.64(H) 9.52(H) 10.91(H)  Sodium 135 - 145 mmol/L 131(L) 133(L) 130(L)  Potassium 3.5 - 5.1 mmol/L 5.0 5.4(H) 5.9(H)  Chloride 98 - 111 mmol/L 93(L) 96(L) 93(L)  CO2 22 - 32  mmol/L 19(L) 20(L) 19(L)  Calcium 8.9 - 10.3 mg/dL 7.2(L) 7.9(L) 7.3(L)    Dialysis orders:  4 hours EDW 138.5 kg  BF 400; DF 800 Bath: 2K, Ca 2.5 Heparin 4500 units load and 1000 units hourly  hectoral 1 mcg each tx Normally 50 mg benadryl PO (itching), 650 mg tylenol (for cramps) and 1500 mg tums (for calcium) each treatment Not on iron or ESA Nephrologist - Dr. Holley Raring    Assessment/Plan:   # ESRD  - HD per MWF schedule with line holiday over the weekend as below   # MSSA bacteremia  - on cefazolin per primary team  - Consulted vascular surgery for removal of tunneled catheter today for line holiday over the weekend  - repeat blood cultures ordered  8/21  # Hyperkalemia - HD today as above   # Acute hypoxic respiratory failure  - extubated. Team working to set up out patient therapy for OSA - optimize volume status with HD   # AMS - CT head no acute intracranial process - Per primary team  - improved    # HTN  - acceptable control   # Hyperphosphatemia  - continue HD  - on auryxia   - Renal diet when taking PO   Claudia Desanctis, MD 05/31/2019 9:49 AM

## 2019-05-31 NOTE — Progress Notes (Signed)
53 year old male with end-stage renal disease and left IJ tunneled dialysis catheter.  Vascular surgery was asked to remove his tunneled catheter today given MSSA bacteremia.  We will plan to remove his catheter this afternoon after he finishes dialysis.  He is posted for Monday for a new tunneled dialysis catheter placement.  I did discuss with him there is some risk that we would have to placed in his groin given multiple IJ catheters in the past and morbid obesity.  Marty Heck, MD [facility] Office: [phone] Pager: [place]

## 2019-05-31 NOTE — Progress Notes (Signed)
PT Cancellation Note  Patient Details Name: Seth [name] MRN: [id number] DOB: 07-01-1966   Cancelled Treatment:     pt to HD today and then to surgery for his catheter.  Will attempt PT eval another day.   Loyal Buba 05/31/2019, 2:29 PM

## 2019-05-31 NOTE — Procedures (Signed)
PRE-OPERATIVE DIAGNOSIS:   Bacteremia; ESRD   POST-OPERATIVE DIAGNOSIS:  Bacteremia; ESRD  PROCEDURE:  Removal of L IJ perm cath   PROVIDER:   Dagoberto Ligas PA-C  ANESTHESIA:  Local with 1% lidocaine  The risks and benefits of this procedure were explained to the patient and the patient consented.   OPERATIVE PROCEDURE:  This procedure was performed at the bedside.  The patient was positioned supine on the table.  Local anesthesia was infiltrated over the tunneled catheter and its cuff over the left side of patient's chest.  The cuff was loosened using a combination of blunt and sharp dissection.  The catheter was removed in its entirety, and hemostasis was achieved with local compression.    The patient tolerated the procedure well, did not have any intraoperative complications and was transferred to recovery room in stable condition.  RECOMMENDATIONS:   Pilar Plate puss noted during removal from tunneled tract.  Plan is for catheter holiday over the weekend and new catheter placement early next week.   Dagoberto Ligas, PA-C [facility] [phone] 05/31/2019  3:51 PM

## 2019-05-31 NOTE — Progress Notes (Signed)
    [facility] has been requested to perform a transesophageal echocardiogram on this patient for bacteremia, scheduled with anesthesia.  After careful review of history and examination, the risks and benefits of transesophageal echocardiogram have been explained including risks of esophageal damage, perforation (1:10,000 risk), bleeding, pharyngeal hematoma as well as other potential complications associated with conscious sedation including aspiration, arrhythmia, respiratory failure and death. Alternatives to treatment were discussed, questions were answered. Patient is willing to proceed. This is scheduled with Dr. Acie Fredrickson on Monday 8/24 at 2pm. Orders written.  Charlie Pitter, PA-C 05/31/2019 4:29 PM

## 2019-05-31 NOTE — Progress Notes (Signed)
[name] for Infectious Disease  Date of Admission:  05/28/2019     Total days of antibiotics 4         ASSESSMENT/PLAN  Seth [name] continues to receive treatment for the MSSA bacteremia with likely source of infection being his dialysis catheter located in his left chest.  Acute encephalopathy likely associated with obesity hypoventilation syndrome and acute on chronic respiratory failure with hypoxia and hypercapnia is improved with stable mental status at present.  TTE negative for vegetations with preserved heart valve functions.  TEE ordered today.  Dialysis catheter line removal remains pending interrogation of fistula/graft.  Continue current dose of Ancef.  Repeat blood cultures today.  1.  Continue cefazolin. 2.  TEE scheduled for Monday 8/24. 3.  Awaiting dialysis catheter removal and possible line holiday pending interrogation of other access. 4.  Repeat blood cultures today. 5.  Continue diabetes management per primary team. 6. Contact precautions for MRSA nasal PCR.      Principal Problem:   Bacteremia due to methicillin susceptible Staphylococcus aureus (MSSA) Active Problems:   Obesity hypoventilation syndrome ([place])   Hypertension associated with diabetes ([place])   End stage renal disease on dialysis ([place])   Controlled type 2 diabetes mellitus with chronic kidney disease on chronic dialysis ([name])   Hyperkalemia   Acute encephalopathy   Endotracheal tube present   Acute on chronic respiratory failure with hypoxia and hypercapnia ([place])   . albuterol  10 mg Nebulization Once  . aspirin EC  81 mg Oral Daily  . atorvastatin  40 mg Oral Daily  . Chlorhexidine Gluconate Cloth  6 each Topical Q0600  . Chlorhexidine Gluconate Cloth  6 each Topical Q0600  . doxercalciferol  1 mcg Oral Q M,W,F-HD  . ferric citrate  630 mg Oral TID WC  . heparin      . heparin  3.8 mL Intravenous Once  . heparin  5,000 Units Subcutaneous Q8H  . insulin aspart  0-20 Units  Subcutaneous TID WC  . insulin aspart  0-5 Units Subcutaneous QHS  . lidocaine  10 mL Intradermal Once  . mouth rinse  15 mL Mouth Rinse BID  . mupirocin ointment  1 application Nasal BID    SUBJECTIVE:  Afebrile overnight with no acute complaints/concerns.  He is frustrated today due to attempts of access into his graft/fistula and seen during dialysis.  No Known Allergies   Review of Systems: Review of Systems  Constitutional: Negative for chills, fever and weight loss.  Respiratory: Negative for cough, shortness of breath and wheezing.   Cardiovascular: Negative for chest pain and leg swelling.  Gastrointestinal: Negative for abdominal pain, constipation, diarrhea, nausea and vomiting.  Skin: Negative for rash.      OBJECTIVE: Vitals:   05/31/19 1000 05/31/19 1030 05/31/19 1045 05/31/19 1100  BP: (!) 94/56 (!) 99/50 (!) (P) 72/42 (!) (P) 112/51  Pulse: 75 76 (P) 73 (P) 73  Resp: 16 17 (P) 16 (P) 15  Temp:      TempSrc:      SpO2: 95% 98% (P) 98% (P) 96%  Weight:       Body mass index is 48.36 kg/m.  Physical Exam Constitutional:      General: He is not in acute distress.    Appearance: He is well-developed. He is morbidly obese.  HENT:     Mouth/Throat:     Comments: Poor dentition Cardiovascular:     Rate and Rhythm: Normal rate and regular rhythm.  Heart sounds: Normal heart sounds.     Comments: Dialysis catheter located in left upper chest appears with dressing that is clean and dry.  Site is without significant evidence of infection.  Currently being used for dialysis Pulmonary:     Effort: Pulmonary effort is normal.     Breath sounds: Normal breath sounds.  Skin:    General: Skin is warm and dry.  Neurological:     Mental Status: He is alert and oriented to person, place, and time.  Psychiatric:        Mood and Affect: Mood normal.        Behavior: Behavior normal.        Thought Content: Thought content normal.        Judgment: Judgment  normal.     Lab Results Lab Results  Component Value Date   WBC 11.0 (H) 05/31/2019   HGB 12.0 (L) 05/31/2019   HCT 38.4 (L) 05/31/2019   MCV 97.0 05/31/2019   PLT 191 05/31/2019    Lab Results  Component Value Date   CREATININE 11.64 (H) 05/31/2019   BUN 73 (H) 05/31/2019   NA 131 (L) 05/31/2019   K 5.0 05/31/2019   CL 93 (L) 05/31/2019   CO2 19 (L) 05/31/2019    Lab Results  Component Value Date   ALT 14 05/29/2019   AST 24 05/29/2019   ALKPHOS 103 05/29/2019   BILITOT 1.5 (H) 05/29/2019     Microbiology: Recent Results (from the past 240 hour(s))  SARS Coronavirus 2 [facility] order, Performed in [facility] hospital lab) Nasopharyngeal Nasopharyngeal Swab     Status: None   Collection Time: 05/28/19  9:14 PM   Specimen: Nasopharyngeal Swab  Result Value Ref Range Status   SARS Coronavirus 2 NEGATIVE NEGATIVE Final    Comment: (NOTE) If result is NEGATIVE SARS-CoV-2 target nucleic acids are NOT DETECTED. The SARS-CoV-2 RNA is generally detectable in upper and lower  respiratory specimens during the acute phase of infection. The lowest  concentration of SARS-CoV-2 viral copies this assay can detect is 250  copies / mL. A negative result does not preclude SARS-CoV-2 infection  and should not be used as the sole basis for treatment or other  patient management decisions.  A negative result may occur with  improper specimen collection / handling, submission of specimen other  than nasopharyngeal swab, presence of viral mutation(s) within the  areas targeted by this assay, and inadequate number of viral copies  (<250 copies / mL). A negative result must be combined with clinical  observations, patient history, and epidemiological information. If result is POSITIVE SARS-CoV-2 target nucleic acids are DETECTED. The SARS-CoV-2 RNA is generally detectable in upper and lower  respiratory specimens dur ing the acute phase of infection.  Positive  results are indicative  of active infection with SARS-CoV-2.  Clinical  correlation with patient history and other diagnostic information is  necessary to determine patient infection status.  Positive results do  not rule out bacterial infection or co-infection with other viruses. If result is PRESUMPTIVE POSTIVE SARS-CoV-2 nucleic acids MAY BE PRESENT.   A presumptive positive result was obtained on the submitted specimen  and confirmed on repeat testing.  While 2019 novel coronavirus  (SARS-CoV-2) nucleic acids may be present in the submitted sample  additional confirmatory testing may be necessary for epidemiological  and / or clinical management purposes  to differentiate between  SARS-CoV-2 and other Sarbecovirus currently known to infect humans.  If clinically  indicated additional testing with an alternate test  methodology [phone]) is advised. The SARS-CoV-2 RNA is generally  detectable in upper and lower respiratory sp ecimens during the acute  phase of infection. The expected result is Negative. Fact Sheet for Patients:  StrictlyIdeas.no Fact Sheet for Healthcare Providers: BankingDealers.co.za This test is not yet approved or cleared by the [place] FDA and has been authorized for detection and/or diagnosis of SARS-CoV-2 by FDA under an Emergency Use Authorization (EUA).  This EUA will remain in effect (meaning this test can be used) for the duration of the COVID-19 declaration under Section 564(b)(1) of the Act, 21 U.S.C. section 360bbb-3(b)(1), unless the authorization is terminated or revoked sooner. Performed at [facility], [place] [address] [place], [place] [address]   Blood culture (routine x 2)     Status: Abnormal   Collection Time: 05/29/19  4:15 AM   Specimen: BLOOD RIGHT HAND  Result Value Ref Range Status   Specimen Description BLOOD RIGHT HAND  Final   Special Requests   Final    BOTTLES DRAWN AEROBIC ONLY Blood Culture  results may not be optimal due to an inadequate volume of blood received in culture bottles   Culture  Setup Time   Final    AEROBIC BOTTLE ONLY GRAM POSITIVE COCCI CRITICAL RESULT CALLED TO, READ BACK BY AND VERIFIED WITHDion Body [facility] 05/29/19 2107 JDW Performed at [facility], [place] [address] [place], [place] [address]    Culture STAPHYLOCOCCUS AUREUS (A)  Final   Report Status 05/31/2019 FINAL  Final   Organism ID, Bacteria STAPHYLOCOCCUS AUREUS  Final      Susceptibility   Staphylococcus aureus - MIC*    CIPROFLOXACIN <=0.5 SENSITIVE Sensitive     ERYTHROMYCIN <=0.25 SENSITIVE Sensitive     GENTAMICIN <=0.5 SENSITIVE Sensitive     OXACILLIN 0.5 SENSITIVE Sensitive     TETRACYCLINE <=1 SENSITIVE Sensitive     VANCOMYCIN <=0.5 SENSITIVE Sensitive     TRIMETH/SULFA <=10 SENSITIVE Sensitive     CLINDAMYCIN <=0.25 SENSITIVE Sensitive     RIFAMPIN <=0.5 SENSITIVE Sensitive     Inducible Clindamycin NEGATIVE Sensitive     * STAPHYLOCOCCUS AUREUS  Blood Culture ID Panel (Reflexed)     Status: Abnormal   Collection Time: 05/29/19  4:15 AM  Result Value Ref Range Status   Enterococcus species NOT DETECTED NOT DETECTED Final   Listeria monocytogenes NOT DETECTED NOT DETECTED Final   Staphylococcus species DETECTED (A) NOT DETECTED Final    Comment: CRITICAL RESULT CALLED TO, READ BACK BY AND VERIFIED WITH: J MILLEN PHARMD 05/29/19 2107 JDW    Staphylococcus aureus (BCID) DETECTED (A) NOT DETECTED Final    Comment: Methicillin (oxacillin) susceptible Staphylococcus aureus (MSSA). Preferred therapy is anti staphylococcal beta lactam antibiotic (Cefazolin or Nafcillin), unless clinically contraindicated. CRITICAL RESULT CALLED TO, READ BACK BY AND VERIFIED WITH: Dion Body [facility] 05/29/19 2107 JDW    Methicillin resistance NOT DETECTED NOT DETECTED Final   Streptococcus species NOT DETECTED NOT DETECTED Final   Streptococcus agalactiae NOT DETECTED NOT DETECTED Final    Streptococcus pneumoniae NOT DETECTED NOT DETECTED Final   Streptococcus pyogenes NOT DETECTED NOT DETECTED Final   Acinetobacter baumannii NOT DETECTED NOT DETECTED Final   Enterobacteriaceae species NOT DETECTED NOT DETECTED Final   Enterobacter cloacae complex NOT DETECTED NOT DETECTED Final   Escherichia coli NOT DETECTED NOT DETECTED Final   Klebsiella oxytoca NOT DETECTED NOT DETECTED Final   Klebsiella pneumoniae NOT  DETECTED NOT DETECTED Final   Proteus species NOT DETECTED NOT DETECTED Final   Serratia marcescens NOT DETECTED NOT DETECTED Final   Haemophilus influenzae NOT DETECTED NOT DETECTED Final   Neisseria meningitidis NOT DETECTED NOT DETECTED Final   Pseudomonas aeruginosa NOT DETECTED NOT DETECTED Final   Candida albicans NOT DETECTED NOT DETECTED Final   Candida glabrata NOT DETECTED NOT DETECTED Final   Candida krusei NOT DETECTED NOT DETECTED Final   Candida parapsilosis NOT DETECTED NOT DETECTED Final   Candida tropicalis NOT DETECTED NOT DETECTED Final    Comment: Performed at [facility], [place] [address], [place], [place] [address]  MRSA PCR Screening     Status: Abnormal   Collection Time: 05/29/19  4:17 AM   Specimen: Nasal Mucosa; Nasopharyngeal  Result Value Ref Range Status   MRSA by PCR POSITIVE (A) NEGATIVE Final    Comment:        The GeneXpert MRSA Assay (FDA approved for NASAL specimens only), is one component of a comprehensive MRSA colonization surveillance program. It is not intended to diagnose MRSA infection nor to guide or monitor treatment for MRSA infections. RESULT CALLED TO, READ BACK BY AND VERIFIED WITH: PHILLIPS,T RN [phone] 05/29/2019 MITCHELL,L Performed at [facility], [place] [address], [place], [place] [address]   Blood culture (routine x 2)     Status: Abnormal   Collection Time: 05/29/19  4:23 AM   Specimen: BLOOD  Result Value Ref Range Status   Specimen Description BLOOD LEFT ANTECUBITAL  Final   Special Requests    Final    BOTTLES DRAWN AEROBIC AND ANAEROBIC Blood Culture results may not be optimal due to an inadequate volume of blood received in culture bottles   Culture  Setup Time   Final    GRAM POSITIVE COCCI CRITICAL VALUE NOTED.  VALUE IS CONSISTENT WITH PREVIOUSLY REPORTED AND CALLED VALUE. IN BOTH AEROBIC AND ANAEROBIC BOTTLES    Culture (A)  Final    STAPHYLOCOCCUS AUREUS SUSCEPTIBILITIES PERFORMED ON PREVIOUS CULTURE WITHIN THE LAST 5 DAYS. Performed at [facility], [place] [address], [place], [place] [address]    Report Status 05/31/2019 FINAL  Final     Terri Piedra, NP [facility] [phone] Pager  05/31/2019  11:28 AM
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
## 2019-06-01 DIAGNOSIS — Z9622 Myringotomy tube(s) status: Secondary | ICD-10-CM

## 2019-06-01 LAB — RENAL FUNCTION PANEL
Albumin: 3 g/dL — ABNORMAL LOW (ref 3.5–5.0)
Anion gap: 19 — ABNORMAL HIGH (ref 5–15)
BUN: 49 mg/dL — ABNORMAL HIGH (ref 6–20)
CO2: 17 mmol/L — ABNORMAL LOW (ref 22–32)
Calcium: 7.6 mg/dL — ABNORMAL LOW (ref 8.9–10.3)
Chloride: 97 mmol/L — ABNORMAL LOW (ref 98–111)
Creatinine, Ser: 8.56 mg/dL — ABNORMAL HIGH (ref 0.61–1.24)
GFR calc Af Amer: 7 mL/min — ABNORMAL LOW (ref >60)
GFR calc non Af Amer: 6 mL/min — ABNORMAL LOW (ref >60)
Glucose, Bld: 170 mg/dL — ABNORMAL HIGH (ref 70–99)
Phosphorus: 6.4 mg/dL — ABNORMAL HIGH (ref 2.5–4.6)
Potassium: 4.4 mmol/L (ref 3.5–5.1)
Sodium: 133 mmol/L — ABNORMAL LOW (ref 135–145)

## 2019-06-01 LAB — GLUCOSE, CAPILLARY
Glucose-Capillary: 155 mg/dL — ABNORMAL HIGH (ref 70–99)
Glucose-Capillary: 190 mg/dL — ABNORMAL HIGH (ref 70–99)
Glucose-Capillary: 166 mg/dL — ABNORMAL HIGH (ref 70–99)
Glucose-Capillary: 188 mg/dL — ABNORMAL HIGH (ref 70–99)

## 2019-06-01 NOTE — Progress Notes (Signed)
[facility] Progress Note  Name: Seth [name] MRN: [id number] DOB: 1966/05/31  Chief Complaint:  Altered mental status   Subjective:  Had HD on 8/21 with 1.7 kg UF.  He has been on 2.5 liters oxygen and states "my breathing is fine".  He had his oxygen off for a bath a few minutes ago.  Called NA to replace tubing as has fallen on floor.  Tunneled left IJ catheter.  He is trying to coordinate his outpatient CPAP.    Review of systems:   Denies n/v Denies shortness of breath  Denies chest pain  Eating well   ---------------------- Background on consult:  Seth [name] is a 53 y.o. male with a history of end-stage renal disease on hemodialysis at [facility] Monday Wednesday Friday, diabetes mellitus, and hypertension, and sleep apnea who presented to the ER with altered mental status.  Per charting family stated that pt was confused and depressed that he may have taken something and they report that he is not compliant with dialysis.  (contrary to what patient reported and also contrary to his outpatient unit reporting as below).  He was hyperkalemic with an initial potassium of 7.2.  He was initially on supplemental oxygen and was to start bipap per nursing.  After HD he was altered and then was emergently intubated.  The patient received dialysis overnight per the on-call provider and had 2 kg net UF.  Spoke with outpatient HD RN and his last treatment was on 8/17 and he completed a full treatment.  HD unit reports that he is usually early and doesn't miss treatment often.  Departure weight was 138.0 kg after pulling 3.9 kg with 8/17 treatment.  They have not started using his AVF but do have orders to start - they have been using his catheter.  Per last vascular note 05/20/19 may access LUE AVF as soon as possible.    Intake/Output Summary (Last 24 hours) at 06/01/2019 1221 Last data filed at 06/01/2019 0100 Gross per 24 hour  Intake 780 ml  Output 1700 ml  Net  -920 ml    Vitals:  Vitals:   05/31/19 1347 05/31/19 2100 06/01/19 0032 06/01/19 0614  BP: 128/81 123/79  (!) 117/53  Pulse: 91 85 69 73  Resp: 14 17 (!) 27 20  Temp:    98.5 F (36.9 C)  TempSrc:  Oral  Oral  SpO2: 92% 96% 100% 98%  Weight:         Physical Exam:   General adult male in bed in no acute distress HEENT normocephalic atraumatic extraocular movements intact sclera anicteric Neck increased neck circumference; trachea midline Lungs clear to auscultation bilaterally normal work of breathing at rest  Heart tachycardia, no rub  Abdomen soft nontender obese habitus  Extremities no pitting lower extremity edema  Psych normal mood and affect Neuro - alert and oriented x 3 Access: bandage over old LIJ tunneled catheter; LUE AVF with bruit and thrill     Medications reviewed   Labs:  BMP Latest Ref Rng & Units 06/01/2019 05/31/2019 05/30/2019  Glucose 70 - 99 mg/dL 170(H) 191(H) 105(H)  BUN 6 - 20 mg/dL 49(H) 73(H) 47(H)  Creatinine 0.61 - 1.24 mg/dL 8.56(H) 11.64(H) 9.52(H)  Sodium 135 - 145 mmol/L 133(L) 131(L) 133(L)  Potassium 3.5 - 5.1 mmol/L 4.4 5.0 5.4(H)  Chloride 98 - 111 mmol/L 97(L) 93(L) 96(L)  CO2 22 - 32 mmol/L 17(L) 19(L) 20(L)  Calcium 8.9 - 10.3 mg/dL 7.6(L) 7.2(L)  7.9(L)    Dialysis orders:  4 hours EDW 138.5 kg  BF 400; DF 800 Bath: 2K, Ca 2.5 Heparin 4500 units load and 1000 units hourly  hectoral 1 mcg each tx Normally 50 mg benadryl PO (itching), 650 mg tylenol (for cramps) and 1500 mg tums (for calcium) each treatment Not on iron or ESA Nephrologist - Dr. Holley Raring    Assessment/Plan:   # ESRD  - HD per MWF schedule with line holiday over the weekend as below   # MSSA bacteremia  - on cefazolin per primary team  - vascular surgery removed tunneled catheter 8/21 for line holiday over the weekend with replacement on 8/24 scheduled  - repeat blood cultures 8/21 NGTD   # Hyperkalemia - improved with HD  # Acute hypoxic respiratory  failure  - Team working to set up out patient therapy for OSA - optimize volume status with HD   # AMS - CT head no acute intracranial process - Per primary team  - resolved   # HTN  - acceptable control     # Hyperphosphatemia  - continue HD  - on auryxia   - Renal diet   Claudia Desanctis, MD 06/01/2019 12:21 PM

## 2019-06-01 NOTE — Progress Notes (Signed)
  Date: 06/01/2019  Patient name: Seth [name]  Medical record number: [id number]  Date of birth: [birth year]   I have seen and evaluated this patient and I have discussed the plan of care with the house staff. Please see their note for complete details. I concur with their findings.  Lenice Pressman, M.D., Ph.D. 06/01/2019, 4:27 PM

## 2019-06-01 NOTE — Plan of Care (Signed)
  Problem: Respiratory: Goal: Ability to maintain a clear airway and adequate ventilation will improve Outcome: Progressing   Problem: Clinical Measurements: Goal: Ability to maintain clinical measurements within normal limits will improve Outcome: Progressing

## 2019-06-01 NOTE — Progress Notes (Signed)
   Subjective: Seth [name] is doing well today. He used the BiPAP for approximately four hours last night. He seems to have a good understanding of the plan moving forward and why he is currently in the hospital. He has no questions or concerns today. We discussed that we have repeated his blood cultures and we are waiting to see if they become positive. We will pursue a TEE on Monday and possibly replace is hemodialysis catheter. All questions and concerns were addressed.  Objective: Vital signs in last 24 hours: Vitals:   05/31/19 1347 05/31/19 2100 06/01/19 0032 06/01/19 0614  BP: 128/81 123/79  (!) 117/53  Pulse: 91 85 69 73  Resp: 14 17 (!) 27 20  Temp:    98.5 F (36.9 C)  TempSrc:  Oral  Oral  SpO2: 92% 96% 100% 98%  Weight:       General: Morbidly obese male in no acute distress Pulm: Good air movement with no wheezing or crackles  CV: RRR, no murmurs, no rubs   Assessment/Plan:  12 you M with Hx of OSA, OHS, ESRD on HD, and DM who presented with Acute Encephalopathy 2/2 Hypercarbia in the setting of Acute on Chronic Hypercarbic Respiratory Failure caused by broken home CPAP and MSSA Bacteremia presumably secondary to line infection.  MSSA Bacteremia  - Continuing IV Ancef  - TTE negative for endocarditis. Plan for TEE on 8/24 at 2pm  - Tunneled HD catheter removed on 8/21 by vascular surgery. 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   Ina Homes, MD 06/01/2019, 11:25 AM

## 2019-06-01 NOTE — Progress Notes (Signed)
Patient not ready for BiPAP at this time, Will call when he is ready to be placed on.
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
## 2019-06-02 LAB — RENAL FUNCTION PANEL
Albumin: 3.3 g/dL — ABNORMAL LOW (ref 3.5–5.0)
Anion gap: 18 — ABNORMAL HIGH (ref 5–15)
BUN: 80 mg/dL — ABNORMAL HIGH (ref 6–20)
CO2: 22 mmol/L (ref 22–32)
Calcium: 8 mg/dL — ABNORMAL LOW (ref 8.9–10.3)
Chloride: 92 mmol/L — ABNORMAL LOW (ref 98–111)
Creatinine, Ser: 10.64 mg/dL — ABNORMAL HIGH (ref 0.61–1.24)
GFR calc Af Amer: 6 mL/min — ABNORMAL LOW (ref >60)
GFR calc non Af Amer: 5 mL/min — ABNORMAL LOW (ref >60)
Glucose, Bld: 162 mg/dL — ABNORMAL HIGH (ref 70–99)
Phosphorus: 5 mg/dL — ABNORMAL HIGH (ref 2.5–4.6)
Potassium: 4.7 mmol/L (ref 3.5–5.1)
Sodium: 132 mmol/L — ABNORMAL LOW (ref 135–145)

## 2019-06-02 LAB — GLUCOSE, CAPILLARY
Glucose-Capillary: 177 mg/dL — ABNORMAL HIGH (ref 70–99)
Glucose-Capillary: 147 mg/dL — ABNORMAL HIGH (ref 70–99)
Glucose-Capillary: 175 mg/dL — ABNORMAL HIGH (ref 70–99)
Glucose-Capillary: 171 mg/dL — ABNORMAL HIGH (ref 70–99)

## 2019-06-02 MED ORDER — LIDOCAINE 4 % EX CREA
TOPICAL_CREAM | Freq: Three times a day (TID) | CUTANEOUS | Status: DC | PRN
  Filled 2019-06-02: qty 5

## 2019-06-02 MED ORDER — OXYCODONE-ACETAMINOPHEN 5-325 MG PO TABS
1.0000 | ORAL_TABLET | Freq: Four times a day (QID) | ORAL | Status: DC | PRN
  Administered 2019-06-02 – 2019-06-04 (×4): 1 via ORAL
  Filled 2019-06-02 (×3): qty 1

## 2019-06-02 MED ORDER — OXYCODONE HCL 5 MG PO TABS
5.0000 mg | ORAL_TABLET | Freq: Four times a day (QID) | ORAL | Status: DC | PRN
  Administered 2019-06-02 – 2019-06-04 (×4): 5 mg via ORAL
  Filled 2019-06-02 (×4): qty 1

## 2019-06-02 MED ORDER — CHLORHEXIDINE GLUCONATE CLOTH 2 % EX PADS
6.0000 | MEDICATED_PAD | Freq: Every day | CUTANEOUS | Status: DC
  Administered 2019-06-03 – 2019-06-05 (×3): 6 via TOPICAL

## 2019-06-02 NOTE — Progress Notes (Signed)
[facility] Progress Note  Name: Seth [name] MRN: [id number] DOB: [birth year]  Chief Complaint:  Altered mental status   Subjective:  Feels ok today.  They have come in to draw his labs.  Last HD on 8/21 with 2.3 kg UF.  Spoke with vascular and patient on schedule for replacement of tunneled catheter tomorrow after this weekend's line holiday; patient states he is aware.  Has been 92% on room air this morning per charting.  States he is to have TEE   Review of systems: No shortness of breath or chest pain  No nausea or vomiting  Good appetite No dizziness or cramping  ---------------------- Background on consult:  Seth [name] is a 53 y.o. male with a history of end-stage renal disease on hemodialysis at [facility] Monday Wednesday Friday, diabetes mellitus, and hypertension, and sleep apnea who presented to the ER with altered mental status.  Per charting family stated that pt was confused and depressed that he may have taken something and they report that he is not compliant with dialysis.  (contrary to what patient reported and also contrary to his outpatient unit reporting as below).  He was hyperkalemic with an initial potassium of 7.2.  He was initially on supplemental oxygen and was to start bipap per nursing.  After HD he was altered and then was emergently intubated.  The patient received dialysis overnight per the on-call provider and had 2 kg net UF.  Spoke with outpatient HD RN and his last treatment was on 8/17 and he completed a full treatment.  HD unit reports that he is usually early and doesn't miss treatment often.  Departure weight was 138.0 kg after pulling 3.9 kg with 8/17 treatment.  They have not started using his AVF but do have orders to start - they have been using his catheter.  Per last vascular note 05/20/19 may access LUE AVF as soon as possible.    Intake/Output Summary (Last 24 hours) at 06/02/2019 1149 Last data filed at 06/02/2019  [id number] Gross per 24 hour  Intake 251.12 ml  Output -  Net 251.12 ml    Vitals:  Vitals:   06/01/19 0614 06/01/19 1502 06/01/19 2235 06/02/19 0559  BP: (!) 117/53 129/86 (!) 167/95 136/64  Pulse: 73 72 74 78  Resp: 20 16 20 18   Temp: 98.5 F (36.9 C) 97.9 F (36.6 C) 97.6 F (36.4 C) 98.2 F (36.8 C)  TempSrc: Oral Oral Oral   SpO2: 98% 97% 100% 92%  Weight:         Physical Exam:   General adult male in bed in no acute distress HEENT normocephalic atraumatic extraocular movements intact sclera anicteric Neck increased neck circumference; trachea midline Lungs clear and unlabored  Heart RRR; no rub Abdomen soft nontender obese habitus  Extremities no to trace lower extremity edema  Psych normal mood and affect Neuro - alert and oriented x 3 Access: LUE AVF with bruit and thrill     Medications reviewed   Labs:  BMP Latest Ref Rng & Units 06/01/2019 05/31/2019 05/30/2019  Glucose 70 - 99 mg/dL 170(H) 191(H) 105(H)  BUN 6 - 20 mg/dL 49(H) 73(H) 47(H)  Creatinine 0.61 - 1.24 mg/dL 8.56(H) 11.64(H) 9.52(H)  Sodium 135 - 145 mmol/L 133(L) 131(L) 133(L)  Potassium 3.5 - 5.1 mmol/L 4.4 5.0 5.4(H)  Chloride 98 - 111 mmol/L 97(L) 93(L) 96(L)  CO2 22 - 32 mmol/L 17(L) 19(L) 20(L)  Calcium 8.9 - 10.3 mg/dL 7.6(L) 7.2(L)  7.9(L)    Dialysis orders:  4 hours EDW 138.5 kg  BF 400; DF 800 Bath: 2K, Ca 2.5 Heparin 4500 units load and 1000 units hourly  hectoral 1 mcg each tx Normally 50 mg benadryl PO (itching), 650 mg tylenol (for cramps) and 1500 mg tums (for calcium) each treatment Not on iron or ESA Nephrologist - Dr. Holley Raring    Assessment/Plan:   # ESRD  - HD per MWF schedule and has had line holiday over the weekend as below  - replacing tunneled catheter on 8/24 with vascular and plan for HD on 8/24 afterward - no acute indication for dialysis todayper exam; will follow up labs  # MSSA bacteremia  - on cefazolin per primary team  - vascular surgery removed  tunneled catheter 8/21 for line holiday over the weekend with replacement on 8/24 scheduled  - repeat blood cultures 8/21 NGTD - note plans for TEE  # Hyperkalemia - resolved with HD  # Acute hypoxic respiratory failure  - Team working to set up out patient therapy for OSA - optimizing volume status with HD   # AMS - CT head no acute intracranial process - Per primary team  - resolved   # HTN  - acceptable control     # Hyperphosphatemia - improving  - continue HD  - on auryxia   - Renal diet   Claudia Desanctis, MD 06/02/2019 11:49 AM

## 2019-06-02 NOTE — Progress Notes (Signed)
[facility]  Subjective  -no complaints.  Remains afebrile.   Objective 136/64 78 98.2 F (36.8 C) 18 92%  Intake/Output Summary (Last 24 hours) at 06/02/2019 0920 Last data filed at 06/02/2019 [id number] Gross per 24 hour  Intake 251.12 ml  Output -  Net 99991111 ml    Left basilic vein fistula with good thrill  Laboratory Lab Results: Recent Labs    05/31/19 0700  WBC 11.0*  HGB 12.0*  HCT 38.4*  PLT 191   BMET Recent Labs    05/31/19 0700 06/01/19 0813  NA 131* 133*  K 5.0 4.4  CL 93* 97*  CO2 19* 17*  GLUCOSE 191* 170*  BUN 73* 49*  CREATININE 11.64* 8.56*  CALCIUM 7.2* 7.6*    COAG Lab Results  Component Value Date   INR 1.02 11/06/2017   INR 1.32 07/14/2015   INR 1.25 07/12/2015   No results found for: PTT  Assessment/Planning:  53 year old male that had bacteremia and vascular surgery was asked to remove his left IJ tunneled TDC on Friday.  He had a line holiday through the weekend.  Remains afebrile.  Looks like his last set of blood cultures from 2 days ago have no growth.  He is on the schedule tomorrow for placement of a new TDC after line holiday.  Please keep him n.p.o. after midnight.  Consent ordered in the chart.    Marty Heck 06/02/2019 9:20 AM --

## 2019-06-02 NOTE — Progress Notes (Signed)
RT has offered to place pt on BIPAP V60 multiple times tonight and pt states he will do it later. RT asked pt to call when ready to go on BIPAP for the night. RT will continue to monitor.

## 2019-06-02 NOTE — Progress Notes (Signed)
   Subjective: Seth [name] was seen at bedside this morning. He states that he is doing well today. He states that his back pain is coming back and he takes oxycodone for it at home. He also has complaints of pain where with catheter was removed. We discussed his procedures for tomorrow. All questions and concerns were addressed.  Objective: Vital signs in last 24 hours: Vitals:   06/01/19 0614 06/01/19 1502 06/01/19 2235 06/02/19 0559  BP: (!) 117/53 129/86 (!) 167/95 136/64  Pulse: 73 72 74 78  Resp: 20 16 20 18   Temp: 98.5 F (36.9 C) 97.9 F (36.6 C) 97.6 F (36.4 C) 98.2 F (36.8 C)  TempSrc: Oral Oral Oral   SpO2: 98% 97% 100% 92%  Weight:       Physical Exam Vitals signs and nursing note reviewed.  Constitutional:      General: He is not in acute distress.    Appearance: He is obese. He is not ill-appearing, toxic-appearing or diaphoretic.  HENT:     Head: Normocephalic and atraumatic.  Cardiovascular:     Rate and Rhythm: Normal rate and regular rhythm.     Pulses: Normal pulses.     Heart sounds: Normal heart sounds. No murmur.  Pulmonary:     Effort: Pulmonary effort is normal. No respiratory distress.     Breath sounds: Normal breath sounds. No stridor. No wheezing or rhonchi.  Neurological:     Mental Status: He is alert.     Assessment/Plan:  MSSA Bacteremia: - Continuing IV Ancef  - TTE negative for endocarditis. Plan for TEE on 8/24 at 2pm  - Tunneled HD catheter removed on 8/21 by vascular surgery. Plan to replace on 8/24 - Ordered lidocaine 4% cream to place at affected site.  - Repeat blood cultures from 8/21 pending  - Appreciate coordinated care with ID, vascular, and nephrology    ESRD on HD: - Last HD session on 8/21 - Appreciate Nephrology Recommendations - Doxercalciferol 1mg  MWF - Ferric Citrate 630mg  TID AC and 430mg  BID (between meals)   Encephalopathy 2/2 Acute on Chronic Hypercarbic Respiratory Failure Chronic Hypercarbic  Respiratory Failure 2/2 OSA and OHS: - Continue BiPAP qhs  Diabetes: - CBGs at goal  - Continue SSI  HTN: - Continue to hold Amlodipine  Chronic Back Pain:  - Ordered home dose of oxycodone 10-325 mg.   FEN: Renal Diet VTE ppx: Heparin Code Status: FULL   Dispo: Anticipated discharge in approximately 4-5 day(s).   Maudie Mercury, MD 06/02/2019, 11:23 AM

## 2019-06-03 ENCOUNTER — Encounter (HOSPITAL_COMMUNITY): Payer: Self-pay | Admitting: Certified Registered"

## 2019-06-03 ENCOUNTER — Inpatient Hospital Stay (HOSPITAL_COMMUNITY): Payer: No Typology Code available for payment source

## 2019-06-03 ENCOUNTER — Inpatient Hospital Stay (HOSPITAL_COMMUNITY): Payer: No Typology Code available for payment source | Admitting: Anesthesiology

## 2019-06-03 ENCOUNTER — Encounter (HOSPITAL_COMMUNITY)
Admission: EM | Disposition: A | Discharge status: Home Health | Payer: Self-pay | Source: Home / Self Care | Attending: Internal Medicine

## 2019-06-03 DIAGNOSIS — M549 Dorsalgia, unspecified: Secondary | ICD-10-CM

## 2019-06-03 DIAGNOSIS — T82898A Other specified complication of vascular prosthetic devices, implants and grafts, initial encounter: Secondary | ICD-10-CM

## 2019-06-03 DIAGNOSIS — G8929 Other chronic pain: Secondary | ICD-10-CM

## 2019-06-03 DIAGNOSIS — R7881 Bacteremia: Secondary | ICD-10-CM

## 2019-06-03 DIAGNOSIS — I361 Nonrheumatic tricuspid (valve) insufficiency: Secondary | ICD-10-CM

## 2019-06-03 DIAGNOSIS — N186 End stage renal disease: Secondary | ICD-10-CM

## 2019-06-03 DIAGNOSIS — Z992 Dependence on renal dialysis: Secondary | ICD-10-CM

## 2019-06-03 HISTORY — PX: INSERTION OF DIALYSIS CATHETER: SHX1324

## 2019-06-03 HISTORY — PX: TEE WITHOUT CARDIOVERSION: SHX5443

## 2019-06-03 LAB — CBC
HCT: 37.1 % — ABNORMAL LOW (ref 39.0–52.0)
Hemoglobin: 11 g/dL — ABNORMAL LOW (ref 13.0–17.0)
MCH: 29.4 pg (ref 26.0–34.0)
MCHC: 29.6 g/dL — ABNORMAL LOW (ref 30.0–36.0)
MCV: 99.2 fL (ref 80.0–100.0)
Platelets: 224 10*3/uL (ref 150–400)
RBC: 3.74 MIL/uL — ABNORMAL LOW (ref 4.22–5.81)
RDW: 14.6 % (ref 11.5–15.5)
WBC: 10.8 10*3/uL — ABNORMAL HIGH (ref 4.0–10.5)
nRBC: 0 % (ref 0.0–0.2)

## 2019-06-03 LAB — RENAL FUNCTION PANEL
Albumin: 2.9 g/dL — ABNORMAL LOW (ref 3.5–5.0)
Anion gap: 17 — ABNORMAL HIGH (ref 5–15)
BUN: 96 mg/dL — ABNORMAL HIGH (ref 6–20)
CO2: 21 mmol/L — ABNORMAL LOW (ref 22–32)
Calcium: 8 mg/dL — ABNORMAL LOW (ref 8.9–10.3)
Chloride: 96 mmol/L — ABNORMAL LOW (ref 98–111)
Creatinine, Ser: 11.97 mg/dL — ABNORMAL HIGH (ref 0.61–1.24)
GFR calc Af Amer: 5 mL/min — ABNORMAL LOW (ref >60)
GFR calc non Af Amer: 4 mL/min — ABNORMAL LOW (ref >60)
Glucose, Bld: 178 mg/dL — ABNORMAL HIGH (ref 70–99)
Phosphorus: 6.1 mg/dL — ABNORMAL HIGH (ref 2.5–4.6)
Potassium: 4.5 mmol/L (ref 3.5–5.1)
Sodium: 134 mmol/L — ABNORMAL LOW (ref 135–145)

## 2019-06-03 LAB — GLUCOSE, CAPILLARY
Glucose-Capillary: 166 mg/dL — ABNORMAL HIGH (ref 70–99)
Glucose-Capillary: 136 mg/dL — ABNORMAL HIGH (ref 70–99)
Glucose-Capillary: 132 mg/dL — ABNORMAL HIGH (ref 70–99)
Glucose-Capillary: 148 mg/dL — ABNORMAL HIGH (ref 70–99)

## 2019-06-03 SURGERY — INSERTION OF DIALYSIS CATHETER
Anesthesia: Monitor Anesthesia Care | Site: Esophagus

## 2019-06-03 SURGERY — CANCELLED PROCEDURE

## 2019-06-03 MED ORDER — CEFAZOLIN SODIUM 1 G IJ SOLR
INTRAMUSCULAR | Status: AC
  Filled 2019-06-03: qty 10

## 2019-06-03 MED ORDER — OXYCODONE HCL 5 MG/5ML PO SOLN: 5.0000 mg | Freq: Once | ORAL | Status: DC | PRN

## 2019-06-03 MED ORDER — SODIUM CHLORIDE 0.9 % IV SOLN
INTRAVENOUS | Status: AC
  Filled 2019-06-03: qty 1.2

## 2019-06-03 MED ORDER — PHENYLEPHRINE 40 MCG/ML (10ML) SYRINGE FOR IV PUSH (FOR BLOOD PRESSURE SUPPORT)
PREFILLED_SYRINGE | INTRAVENOUS | Status: AC
  Filled 2019-06-03: qty 20

## 2019-06-03 MED ORDER — HEPARIN SODIUM (PORCINE) 1000 UNIT/ML IJ SOLN
INTRAMUSCULAR | Status: AC
  Administered 2019-06-03: 1000 [IU]
  Filled 2019-06-03: qty 4

## 2019-06-03 MED ORDER — EPHEDRINE SULFATE 50 MG/ML IJ SOLN
INTRAMUSCULAR | Status: DC | PRN
  Administered 2019-06-03 (×2): 15 mg via INTRAVENOUS
  Administered 2019-06-03 (×2): 10 mg via INTRAVENOUS

## 2019-06-03 MED ORDER — SODIUM CHLORIDE 0.9 % IV SOLN
INTRAVENOUS | Status: DC | PRN
  Administered 2019-06-03: 500 mL

## 2019-06-03 MED ORDER — SODIUM CHLORIDE 0.9 % IV SOLN
INTRAVENOUS | Status: DC | PRN
  Administered 2019-06-03: 100 ug/min via INTRAVENOUS

## 2019-06-03 MED ORDER — FENTANYL CITRATE (PF) 250 MCG/5ML IJ SOLN
INTRAMUSCULAR | Status: AC
  Filled 2019-06-03: qty 5

## 2019-06-03 MED ORDER — ONDANSETRON HCL 4 MG/2ML IJ SOLN
INTRAMUSCULAR | Status: DC | PRN
  Administered 2019-06-03: 4 mg via INTRAVENOUS

## 2019-06-03 MED ORDER — FENTANYL CITRATE (PF) 100 MCG/2ML IJ SOLN
INTRAMUSCULAR | Status: AC
  Administered 2019-06-03: 15:00:00 25 ug via INTRAVENOUS
  Filled 2019-06-03: qty 2

## 2019-06-03 MED ORDER — PROTAMINE SULFATE 10 MG/ML IV SOLN
INTRAVENOUS | Status: AC
  Filled 2019-06-03: qty 5

## 2019-06-03 MED ORDER — 0.9 % SODIUM CHLORIDE (POUR BTL) OPTIME
TOPICAL | Status: DC | PRN
  Administered 2019-06-03: 1000 mL

## 2019-06-03 MED ORDER — LIDOCAINE 2% (20 MG/ML) 5 ML SYRINGE
INTRAMUSCULAR | Status: AC
  Filled 2019-06-03: qty 5

## 2019-06-03 MED ORDER — HEPARIN SODIUM (PORCINE) 1000 UNIT/ML IJ SOLN
INTRAMUSCULAR | Status: AC
  Filled 2019-06-03: qty 1

## 2019-06-03 MED ORDER — OXYCODONE HCL 5 MG PO TABS: 5.0000 mg | ORAL_TABLET | Freq: Once | ORAL | Status: DC | PRN

## 2019-06-03 MED ORDER — PROPOFOL 10 MG/ML IV BOLUS
INTRAVENOUS | Status: DC | PRN
  Administered 2019-06-03: 40 mg via INTRAVENOUS
  Administered 2019-06-03: 160 mg via INTRAVENOUS

## 2019-06-03 MED ORDER — LIDOCAINE 2% (20 MG/ML) 5 ML SYRINGE
INTRAMUSCULAR | Status: DC | PRN
  Administered 2019-06-03: 40 mg via INTRAVENOUS
  Administered 2019-06-03: 60 mg via INTRAVENOUS

## 2019-06-03 MED ORDER — SUCCINYLCHOLINE CHLORIDE 200 MG/10ML IV SOSY
PREFILLED_SYRINGE | INTRAVENOUS | Status: AC
  Filled 2019-06-03: qty 10

## 2019-06-03 MED ORDER — FENTANYL CITRATE (PF) 100 MCG/2ML IJ SOLN
INTRAMUSCULAR | Status: DC | PRN
  Administered 2019-06-03: 50 ug via INTRAVENOUS

## 2019-06-03 MED ORDER — HEPARIN SODIUM (PORCINE) 1000 UNIT/ML IJ SOLN
INTRAMUSCULAR | Status: DC | PRN
  Administered 2019-06-03: 3800 [IU] via INTRAVENOUS

## 2019-06-03 MED ORDER — PROPOFOL 10 MG/ML IV BOLUS
INTRAVENOUS | Status: AC
  Filled 2019-06-03: qty 20

## 2019-06-03 MED ORDER — SODIUM CHLORIDE 0.9 % IV SOLN: INTRAVENOUS | Status: DC

## 2019-06-03 MED ORDER — SODIUM CHLORIDE 0.9 % IV SOLN
INTRAVENOUS | Status: DC
  Administered 2019-06-03: 13:00:00 via INTRAVENOUS

## 2019-06-03 MED ORDER — OXYCODONE-ACETAMINOPHEN 5-325 MG PO TABS
ORAL_TABLET | ORAL | Status: AC
  Filled 2019-06-03: qty 1

## 2019-06-03 MED ORDER — SUCCINYLCHOLINE CHLORIDE 20 MG/ML IJ SOLN
INTRAMUSCULAR | Status: DC | PRN
  Administered 2019-06-03: 160 mg via INTRAVENOUS

## 2019-06-03 MED ORDER — PHENYLEPHRINE HCL (PRESSORS) 10 MG/ML IV SOLN
INTRAVENOUS | Status: DC | PRN
  Administered 2019-06-03: 120 ug via INTRAVENOUS
  Administered 2019-06-03 (×4): 80 ug via INTRAVENOUS
  Administered 2019-06-03: 120 ug via INTRAVENOUS
  Administered 2019-06-03: 80 ug via INTRAVENOUS

## 2019-06-03 MED ORDER — FENTANYL CITRATE (PF) 100 MCG/2ML IJ SOLN
25.0000 ug | INTRAMUSCULAR | Status: DC | PRN
  Administered 2019-06-03 (×2): 25 ug via INTRAVENOUS

## 2019-06-03 MED ORDER — LIDOCAINE HCL (PF) 1 % IJ SOLN
INTRAMUSCULAR | Status: AC
  Filled 2019-06-03: qty 30

## 2019-06-03 MED ORDER — EPHEDRINE 5 MG/ML INJ
INTRAVENOUS | Status: AC
  Filled 2019-06-03: qty 20

## 2019-06-03 MED ORDER — ONDANSETRON HCL 4 MG/2ML IJ SOLN: 4.0000 mg | Freq: Once | INTRAMUSCULAR | Status: DC | PRN

## 2019-06-03 MED ORDER — ONDANSETRON HCL 4 MG/2ML IJ SOLN
INTRAMUSCULAR | Status: AC
  Filled 2019-06-03: qty 2

## 2019-06-03 SURGICAL SUPPLY — 53 items
BAG DECANTER FOR FLEXI CONT (MISCELLANEOUS) ×4 IMPLANT
BIOPATCH RED 1 DISK 7.0 (GAUZE/BANDAGES/DRESSINGS) ×3 IMPLANT
BIOPATCH RED 1IN DISK 7.0MM (GAUZE/BANDAGES/DRESSINGS) ×1
CATH BEACON 5 .035 65 KMP TIP (CATHETERS) ×2 IMPLANT
CATH PALINDROME RT-P 15FX19CM (CATHETERS) IMPLANT
CATH PALINDROME RT-P 15FX23CM (CATHETERS) IMPLANT
CATH PALINDROME RT-P 15FX28CM (CATHETERS) ×4 IMPLANT
CATH PALINDROME RT-P 15FX55CM (CATHETERS) IMPLANT
CATH STRAIGHT 5FR 65CM (CATHETERS) IMPLANT
COVER PROBE W GEL 5X96 (DRAPES) ×4 IMPLANT
COVER SURGICAL LIGHT HANDLE (MISCELLANEOUS) ×4 IMPLANT
COVER WAND RF STERILE (DRAPES) ×4 IMPLANT
DECANTER SPIKE VIAL GLASS SM (MISCELLANEOUS) ×4 IMPLANT
DERMABOND ADVANCED (GAUZE/BANDAGES/DRESSINGS) ×2
DERMABOND ADVANCED .7 DNX12 (GAUZE/BANDAGES/DRESSINGS) ×2 IMPLANT
DRAPE C-ARM 42X72 X-RAY (DRAPES) ×4 IMPLANT
DRAPE CHEST BREAST 15X10 FENES (DRAPES) ×4 IMPLANT
GAUZE 4X4 16PLY RFD (DISPOSABLE) ×6 IMPLANT
GLOVE BIO SURGEON STRL SZ 6.5 (GLOVE) ×1 IMPLANT
GLOVE BIO SURGEON STRL SZ7.5 (GLOVE) ×4 IMPLANT
GLOVE BIO SURGEONS STRL SZ 6.5 (GLOVE) ×1
GLOVE BIOGEL PI IND STRL 8 (GLOVE) ×2 IMPLANT
GLOVE BIOGEL PI INDICATOR 8 (GLOVE) ×2
GLOVE SURG SS PI 8.0 STRL IVOR (GLOVE) ×2 IMPLANT
GOWN STRL REUS W/ TWL LRG LVL3 (GOWN DISPOSABLE) ×4 IMPLANT
GOWN STRL REUS W/ TWL XL LVL3 (GOWN DISPOSABLE) ×4 IMPLANT
GOWN STRL REUS W/TWL LRG LVL3 (GOWN DISPOSABLE) ×4
GOWN STRL REUS W/TWL XL LVL3 (GOWN DISPOSABLE) ×4
GUIDEWIRE ANGLED .035X260CM (WIRE) ×2 IMPLANT
KIT BASIN OR (CUSTOM PROCEDURE TRAY) ×4 IMPLANT
KIT TURNOVER KIT B (KITS) ×4 IMPLANT
NDL 18GX1X1/2 (RX/OR ONLY) (NEEDLE) ×2 IMPLANT
NDL HYPO 25GX1X1/2 BEV (NEEDLE) ×2 IMPLANT
NEEDLE 18GX1X1/2 (RX/OR ONLY) (NEEDLE) ×4 IMPLANT
NEEDLE HYPO 25GX1X1/2 BEV (NEEDLE) ×4 IMPLANT
NS IRRIG 1000ML POUR BTL (IV SOLUTION) ×4 IMPLANT
PACK SURGICAL SETUP 50X90 (CUSTOM PROCEDURE TRAY) ×4 IMPLANT
PAD ARMBOARD 7.5X6 YLW CONV (MISCELLANEOUS) ×8 IMPLANT
SET MICROPUNCTURE 5F STIFF (MISCELLANEOUS) IMPLANT
SHEATH PINNACLE 5F 10CM (SHEATH) ×4 IMPLANT
SOAP 2 % CHG 4 OZ (WOUND CARE) ×4 IMPLANT
SUT ETHILON 3 0 PS 1 (SUTURE) ×4 IMPLANT
SUT MNCRL AB 4-0 PS2 18 (SUTURE) ×4 IMPLANT
SYR 10ML LL (SYRINGE) ×4 IMPLANT
SYR 20ML LL LF (SYRINGE) ×8 IMPLANT
SYR 5ML LL (SYRINGE) ×4 IMPLANT
SYR CONTROL 10ML LL (SYRINGE) ×4 IMPLANT
TOWEL GREEN STERILE (TOWEL DISPOSABLE) ×4 IMPLANT
TOWEL GREEN STERILE FF (TOWEL DISPOSABLE) ×4 IMPLANT
WATER STERILE IRR 1000ML POUR (IV SOLUTION) ×4 IMPLANT
WIRE AMPLATZ SS-J .035X180CM (WIRE) IMPLANT
WIRE BENTSON .035X145CM (WIRE) ×2 IMPLANT
WIRE ROSEN 145CM (WIRE) ×2 IMPLANT

## 2019-06-03 NOTE — Progress Notes (Signed)
Seth [name] ROUNDING NOTE   Subjective:   This is a 53 year old gentleman end-stage renal disease hemodialysis [facility] Monday Wednesday Friday sleep apnea presented to the emergency room with the altered mental status.  Blood pressure 119/66 pulse 90 temperature 98.9 O2 sats 94% room air  Sodium 132 potassium 4.7 chloride 92 CO2 22 BUN 80 creatinine 10 glucose 162 calcium 8 phosphorus 5 albumin 3.3  Aspirin 81 mg Lipitor 40 mg daily, Hectorol 1 mcg Monday Wednesday Friday, Auryxia 630 mg 3 times daily with meals.  Ancef 1 g daily  Objective:  Vital signs in last 24 hours:  Temp:  [97.6 F (36.4 C)-98.9 F (37.2 C)] 98.9 F (37.2 C) (08/24 0431) Pulse Rate:  [67-90] 90 (08/24 0600) Resp:  [16-20] 18 (08/24 0600) BP: (119-138)/(66-88) 119/66 (08/24 0431) SpO2:  [87 %-100 %] 94 % (08/24 0649) FiO2 (%):  [40 %] 40 % (08/24 0123)  Weight change:  Filed Weights   05/31/19 0449 05/31/19 0740 05/31/19 1250  Weight: (!) 140.1 kg (!) 140.6 kg (!) 138.9 kg    Intake/Output: I/O last 3 completed shifts: In: 290 [P.O.:240; IV Piggyback:50] Out: 0    Intake/Output this shift:  No intake/output data recorded.  General adult male in bed in no acute distress HEENT normocephalic atraumatic extraocular movements intact sclera anicteric Neck increased neck circumference; trachea midline Lungs clear and unlabored  Heart RRR; no rub Abdomen soft nontender obese habitus  Extremities no to trace lower extremity edema  Psych normal mood and affect Neuro - alert and oriented x 3 Access: LUE AVF with bruit and thrill     Basic Metabolic Panel: Recent Labs  Lab 05/29/19 0731 05/30/19 0628 05/31/19 0700 06/01/19 0813 06/02/19 1209  NA 130* 133* 131* 133* 132*  K 5.9* 5.4* 5.0 4.4 4.7  CL 93* 96* 93* 97* 92*  CO2 19* 20* 19* 17* 22  GLUCOSE 127* 105* 191* 170* 162*  BUN 51* 47* 73* 49* 80*  CREATININE 10.91* 9.52* 11.64* 8.56* 10.64*  CALCIUM 7.3* 7.9*  7.2* 7.6* 8.0*  PHOS 6.2* 7.5* 9.1* 6.4* 5.0*    Liver Function Tests: Recent Labs  Lab 05/28/19 2157  05/29/19 0415 05/29/19 0731 05/30/19 0628 05/31/19 0700 06/01/19 0813 06/02/19 1209  AST 16  --  24  --   --   --   --   --   ALT 12  --  14  --   --   --   --   --   ALKPHOS 111  --  103  --   --   --   --   --   BILITOT 0.6  --  1.5*  --   --   --   --   --   PROT 9.1*  --  9.0*  --   --   --   --   --   ALBUMIN 3.4*   < > 3.4* 2.8* 2.7* 2.7* 3.0* 3.3*   < > = values in this interval not displayed.   No results for input(s): LIPASE, AMYLASE in the last 168 hours. No results for input(s): AMMONIA in the last 168 hours.  CBC: Recent Labs  Lab 05/28/19 2157  05/29/19 0055 05/29/19 0415 05/29/19 0543 05/29/19 0731 05/31/19 0700  WBC 14.0*  --  13.2* 17.5*  --  19.8* 11.0*  NEUTROABS 12.0*  --   --   --   --   --   --   HGB 12.3*   < >  12.0* 12.6* 13.6 11.5* 12.0*  HCT 39.2   < > 39.3 40.8 40.0 36.8* 38.4*  MCV 98.0  --  98.3 98.1  --  96.8 97.0  PLT 192  --  182 190  --  167 191   < > = values in this interval not displayed.    Cardiac Enzymes: No results for input(s): CKTOTAL, CKMB, CKMBINDEX, TROPONINI in the last 168 hours.  BNP: Invalid input(s): POCBNP  CBG: Recent Labs  Lab 06/02/19 0754 06/02/19 1243 06/02/19 1709 06/02/19 2313 06/03/19 0850  GLUCAP 177* 147* 175* 171* 166*    Microbiology: Results for orders placed or performed during the hospital encounter of 05/28/19  SARS Coronavirus 2 [facility] order, Performed in [facility] hospital lab) Nasopharyngeal Nasopharyngeal Swab     Status: None   Collection Time: 05/28/19  9:14 PM   Specimen: Nasopharyngeal Swab  Result Value Ref Range Status   SARS Coronavirus 2 NEGATIVE NEGATIVE Final    Comment: (NOTE) If result is NEGATIVE SARS-CoV-2 target nucleic acids are NOT DETECTED. The SARS-CoV-2 RNA is generally detectable in upper and lower  respiratory specimens during the acute phase of  infection. The lowest  concentration of SARS-CoV-2 viral copies this assay can detect is 250  copies / mL. A negative result does not preclude SARS-CoV-2 infection  and should not be used as the sole basis for treatment or other  patient management decisions.  A negative result may occur with  improper specimen collection / handling, submission of specimen other  than nasopharyngeal swab, presence of viral mutation(s) within the  areas targeted by this assay, and inadequate number of viral copies  (<250 copies / mL). A negative result must be combined with clinical  observations, patient history, and epidemiological information. If result is POSITIVE SARS-CoV-2 target nucleic acids are DETECTED. The SARS-CoV-2 RNA is generally detectable in upper and lower  respiratory specimens dur ing the acute phase of infection.  Positive  results are indicative of active infection with SARS-CoV-2.  Clinical  correlation with patient history and other diagnostic information is  necessary to determine patient infection status.  Positive results do  not rule out bacterial infection or co-infection with other viruses. If result is PRESUMPTIVE POSTIVE SARS-CoV-2 nucleic acids MAY BE PRESENT.   A presumptive positive result was obtained on the submitted specimen  and confirmed on repeat testing.  While 2019 novel coronavirus  (SARS-CoV-2) nucleic acids may be present in the submitted sample  additional confirmatory testing may be necessary for epidemiological  and / or clinical management purposes  to differentiate between  SARS-CoV-2 and other Sarbecovirus currently known to infect humans.  If clinically indicated additional testing with an alternate test  methodology [phone]) is advised. The SARS-CoV-2 RNA is generally  detectable in upper and lower respiratory sp ecimens during the acute  phase of infection. The expected result is Negative. Fact Sheet for Patients:   StrictlyIdeas.no Fact Sheet for Healthcare Providers: BankingDealers.co.za This test is not yet approved or cleared by the [place] FDA and has been authorized for detection and/or diagnosis of SARS-CoV-2 by FDA under an Emergency Use Authorization (EUA).  This EUA will remain in effect (meaning this test can be used) for the duration of the COVID-19 declaration under Section 564(b)(1) of the Act, 21 U.S.C. section 360bbb-3(b)(1), unless the authorization is terminated or revoked sooner. Performed at [facility], [place] [address] [place], [place] [address]   Blood culture (routine x 2)  Status: Abnormal   Collection Time: 05/29/19  4:15 AM   Specimen: BLOOD RIGHT HAND  Result Value Ref Range Status   Specimen Description BLOOD RIGHT HAND  Final   Special Requests   Final    BOTTLES DRAWN AEROBIC ONLY Blood Culture results may not be optimal due to an inadequate volume of blood received in culture bottles   Culture  Setup Time   Final    AEROBIC BOTTLE ONLY GRAM POSITIVE COCCI CRITICAL RESULT CALLED TO, READ BACK BY AND VERIFIED WITHDion Body [facility] 05/29/19 2107 JDW Performed at [facility], [place] [address] [place], [place] [address]    Culture STAPHYLOCOCCUS AUREUS (A)  Final   Report Status 05/31/2019 FINAL  Final   Organism ID, Bacteria STAPHYLOCOCCUS AUREUS  Final      Susceptibility   Staphylococcus aureus - MIC*    CIPROFLOXACIN <=0.5 SENSITIVE Sensitive     ERYTHROMYCIN <=0.25 SENSITIVE Sensitive     GENTAMICIN <=0.5 SENSITIVE Sensitive     OXACILLIN 0.5 SENSITIVE Sensitive     TETRACYCLINE <=1 SENSITIVE Sensitive     VANCOMYCIN <=0.5 SENSITIVE Sensitive     TRIMETH/SULFA <=10 SENSITIVE Sensitive     CLINDAMYCIN <=0.25 SENSITIVE Sensitive     RIFAMPIN <=0.5 SENSITIVE Sensitive     Inducible Clindamycin NEGATIVE Sensitive     * STAPHYLOCOCCUS AUREUS  Blood Culture ID Panel (Reflexed)     Status: Abnormal    Collection Time: 05/29/19  4:15 AM  Result Value Ref Range Status   Enterococcus species NOT DETECTED NOT DETECTED Final   Listeria monocytogenes NOT DETECTED NOT DETECTED Final   Staphylococcus species DETECTED (A) NOT DETECTED Final    Comment: CRITICAL RESULT CALLED TO, READ BACK BY AND VERIFIED WITH: J MILLEN PHARMD 05/29/19 2107 JDW    Staphylococcus aureus (BCID) DETECTED (A) NOT DETECTED Final    Comment: Methicillin (oxacillin) susceptible Staphylococcus aureus (MSSA). Preferred therapy is anti staphylococcal beta lactam antibiotic (Cefazolin or Nafcillin), unless clinically contraindicated. CRITICAL RESULT CALLED TO, READ BACK BY AND VERIFIED WITH: Dion Body [facility] 05/29/19 2107 JDW    Methicillin resistance NOT DETECTED NOT DETECTED Final   Streptococcus species NOT DETECTED NOT DETECTED Final   Streptococcus agalactiae NOT DETECTED NOT DETECTED Final   Streptococcus pneumoniae NOT DETECTED NOT DETECTED Final   Streptococcus pyogenes NOT DETECTED NOT DETECTED Final   Acinetobacter baumannii NOT DETECTED NOT DETECTED Final   Enterobacteriaceae species NOT DETECTED NOT DETECTED Final   Enterobacter cloacae complex NOT DETECTED NOT DETECTED Final   Escherichia coli NOT DETECTED NOT DETECTED Final   Klebsiella oxytoca NOT DETECTED NOT DETECTED Final   Klebsiella pneumoniae NOT DETECTED NOT DETECTED Final   Proteus species NOT DETECTED NOT DETECTED Final   Serratia marcescens NOT DETECTED NOT DETECTED Final   Haemophilus influenzae NOT DETECTED NOT DETECTED Final   Neisseria meningitidis NOT DETECTED NOT DETECTED Final   Pseudomonas aeruginosa NOT DETECTED NOT DETECTED Final   Candida albicans NOT DETECTED NOT DETECTED Final   Candida glabrata NOT DETECTED NOT DETECTED Final   Candida krusei NOT DETECTED NOT DETECTED Final   Candida parapsilosis NOT DETECTED NOT DETECTED Final   Candida tropicalis NOT DETECTED NOT DETECTED Final    Comment: Performed at [facility],  [place] [address], [place], [place] [address]  MRSA PCR Screening     Status: Abnormal   Collection Time: 05/29/19  4:17 AM   Specimen: Nasal Mucosa; Nasopharyngeal  Result Value Ref Range Status  MRSA by PCR POSITIVE (A) NEGATIVE Final    Comment:        The GeneXpert MRSA Assay (FDA approved for NASAL specimens only), is one component of a comprehensive MRSA colonization surveillance program. It is not intended to diagnose MRSA infection nor to guide or monitor treatment for MRSA infections. RESULT CALLED TO, READ BACK BY AND VERIFIED WITH: PHILLIPS,T RN [phone] 05/29/2019 MITCHELL,L Performed at [facility], [place] [address], [place], [place] [address]   Blood culture (routine x 2)     Status: Abnormal   Collection Time: 05/29/19  4:23 AM   Specimen: BLOOD  Result Value Ref Range Status   Specimen Description BLOOD LEFT ANTECUBITAL  Final   Special Requests   Final    BOTTLES DRAWN AEROBIC AND ANAEROBIC Blood Culture results may not be optimal due to an inadequate volume of blood received in culture bottles   Culture  Setup Time   Final    GRAM POSITIVE COCCI CRITICAL VALUE NOTED.  VALUE IS CONSISTENT WITH PREVIOUSLY REPORTED AND CALLED VALUE. IN BOTH AEROBIC AND ANAEROBIC BOTTLES    Culture (A)  Final    STAPHYLOCOCCUS AUREUS SUSCEPTIBILITIES PERFORMED ON PREVIOUS CULTURE WITHIN THE LAST 5 DAYS. Performed at [facility], [place] [address] [place], [place] [address]    Report Status 05/31/2019 FINAL  Final  Culture, blood (routine x 2)     Status: None (Preliminary result)   Collection Time: 05/31/19  4:49 PM   Specimen: BLOOD RIGHT HAND  Result Value Ref Range Status   Specimen Description BLOOD RIGHT HAND  Final   Special Requests   Final    BOTTLES DRAWN AEROBIC AND ANAEROBIC Blood Culture adequate volume   Culture   Final    NO GROWTH 3 DAYS Performed at [facility], [place] [address], [place], [place] [address]    Report Status PENDING  Incomplete   Culture, blood (routine x 2)     Status: None (Preliminary result)   Collection Time: 05/31/19  7:49 PM   Specimen: BLOOD  Result Value Ref Range Status   Specimen Description BLOOD SITE NOT SPECIFIED  Final   Special Requests   Final    BOTTLES DRAWN AEROBIC ONLY Blood Culture results may not be optimal due to an inadequate volume of blood received in culture bottles   Culture   Final    NO GROWTH 3 DAYS Performed at [facility], [place] [address] [place], [place] [address]    Report Status PENDING  Incomplete    Coagulation Studies: No results for input(s): LABPROT, INR in the last 72 hours.  Urinalysis: No results for input(s): COLORURINE, LABSPEC, PHURINE, GLUCOSEU, HGBUR, BILIRUBINUR, KETONESUR, PROTEINUR, UROBILINOGEN, NITRITE, LEUKOCYTESUR in the last 72 hours.  Invalid input(s): APPERANCEUR    Imaging: No results found.   Medications:   .  ceFAZolin (ANCEF) IV 1 g (06/02/19 1806)   . albuterol  10 mg Nebulization Once  . aspirin EC  81 mg Oral Daily  . atorvastatin  40 mg Oral Daily  . Chlorhexidine Gluconate Cloth  6 each Topical Q0600  . doxercalciferol  1 mcg Oral Q M,W,F-HD  . ferric citrate  630 mg Oral TID WC  . heparin  5,000 Units Subcutaneous Q8H  . insulin aspart  0-20 Units Subcutaneous TID WC  . insulin aspart  0-5 Units Subcutaneous QHS  . lidocaine  10 mL Intradermal Once  . mouth rinse  15 mL Mouth Rinse  BID   acetaminophen **OR** acetaminophen, alteplase, bisacodyl, docusate, ferric citrate, heparin, lidocaine, lidocaine (PF), lidocaine-prilocaine, oxyCODONE-acetaminophen **AND** oxyCODONE, pentafluoroprop-tetrafluoroeth, senna-docusate  Assessment/ Plan:   ESRD-Monday Wednesday Friday dialysis line holiday weekend replacing  06/03/2019 and plan for dialysis after line placement.  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

## 2019-06-03 NOTE — Progress Notes (Signed)
    [facility] for Infectious Disease  Date of Admission:  05/28/2019            BRIEF PROGRESS NOTE:  Mr. Blanke completed TEE with no concerns for endocarditis with valvular structure and function well maintained. Source control appears to be achieved with removal of previous tunneled dialysis cathter. Repeat blood cultures remain without growth to date indicating rapid clearance of infection. Will plan for treatment duration of 4 weeks of Ancef given in combination with dialysis. Will plan for repeat surveillance cultures 2 weeks following the completion of treatment.  Diagnosis: MSSA bacteremia  Culture Result: MSSA  No Known Allergies  OPAT Orders Discharge antibiotics: Cefazolin  Duration: 4 weeks  End Date: 06/28/2019  [facility] Per Protocol:  Labs weekly while on IV antibiotics: __ CBC with differential __ BMP __ CMP __ CRP __ ESR __ Vancomycin trough __ CK  __ Please pull PIC at completion of IV antibiotics __ Please leave PIC in place until doctor has seen patient or been notified  Fax weekly labs to [phone]  Clinic Follow Up Appt:  07/11/2019 at 10am with Terri Piedra, NP   Principal Problem:   Bacteremia due to methicillin susceptible Staphylococcus aureus (MSSA) Active Problems:   Obesity hypoventilation syndrome ([place])   Hypertension associated with diabetes ([place])   End stage renal disease on dialysis ([place])   Controlled type 2 diabetes mellitus with chronic kidney disease on chronic dialysis ([place])   Hyperkalemia   Acute encephalopathy   Acute on chronic respiratory failure with hypoxia and hypercapnia ([place])   Terri Piedra, NP [facility] [phone] Pager  06/03/2019  4:34 PM

## 2019-06-03 NOTE — Op Note (Signed)
Date: June 03, 2019  Preoperative diagnosis: End-stage renal disease with need for new tunneled dialysis catheter placement after removal of recent infected catheter  Postoperative diagnosis: Same  Procedure: 1.  Ultrasound-guided access of the right internal jugular vein 2.  Ultrasound-guided access of the left internal jugular vein 3.  Placement of left internal jugular vein tunneled dialysis catheter with tip in the right atrium (28 cm tunneled palindrome catheter)  Surgeon: Dr. Marty Heck, MD  Assistant: OR staff  Indication: Patient is a 53 year old male with ESRD who has had multiple previous tunneled dialysis catheters.  Vascular surgery was called on Friday to remove his left IJ tunneled dialysis catheter given concern for infection.  This was removed on Friday and he got a line holiday through the weekend.  He presents today after line holiday for attempted new dialysis catheter placement.  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  Subsequently accessed the left internal jugular vein and again could not get a wire to advance and appeared to have some clot in the left internal jugular vein.  Ultimately I placed a short 5 French sheath in the left internal jugular vein.  Then I used a multitude of catheters and finally got a soft angled Glidewire to cross his left IJ into the left innominate superior vena cava and into the right atrium.  Over a KMP catheter then exchanged for stiffer Rosen wire.  Ultimately after initially placing a 28 cm tunneled palindrome catheter through a new tunnel I could not get the tip of the catheter into the superior caval junction after removing the wire and peel-away sheath.  Ultimately the catheter had to be cut in half and placed a new Rosen wire and a new dilator peel-away sheath and a new catheter was placed  over the wire into the right atrium under fluoroscopic guidance.  I then had to tunnel this more laterally to prevent the catheter from kinking on the chest wall.  Anesthesia: General  Details: The patient was taken to the operating room after informed consent was obtained.  He was placed on the operative table in the supine position.  His bilateral necks were then prepped and draped usual sterile fashion.  Prep timeout was performed identify patient procedure and site.  Initially used sterile ultrasound probe evaluate his right internal jugular vein and it appeared patent and image was saved.  Under US guidance an 18-gauge needle was placed into his right IJ with venous backbleeding.  I then tred to advance a J-wire down the right IJ and immediately met resistance and could not get the wire to thread.  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  Subsequently decided to go to the left side which is where his most recent catheter was placed.  Again evaluate left internal jugular vein with ultrasound was patent and image was saved.  We placed an 18-gauge needle on the left IJ with some venous backbleeding attempted to thread a J-wire but it would not advance.  I then got a Bentson wire and again it would not advance.  At that point in time I accessed the vein again and got a soft angle Glidewire to advance through the left internal jugular left innominate into the superior vena cava.  Over this I then placed a short 5 [place] sheath.  I then put a KMP catheter over the wire and  exchanged for a Rosen wire for more support.  I then took a 28 cm palindrome catheter measured on the chest wall.  I made a counterincision on the chest wall and then used a tunneler to tunnel from the chest wall incision to the IJ stick site through a new tunnel given recent infection.  I then dilated over the wire and then placed the dilator peel-away sheath into the innominate through the IJ.  I pulled my wire and inner dilator.  The tip  of the catheter had been tunneled to the chest wall.  I then placed the catheter through the dilator peel-away sheath and peeled the sheath away.  Unfortunately I could not get the tip of the catheter go into the superior vena cava and it appeared severely kinked on the chest wall as well.  Ultimately I kept manipulating the catheter for added length but could not get it to advance any.  That point in time I then used a hemostat from the IJ access site and got the catheter.  Ultimately the catheter was cut in half.  I put a Rosen wire back down the catheter into the right atrium.  I removed the distal half of the catheter that had been placed in the IJ.  And removed the other half in the chest wall.  Over this I then placed a dilator peel-away sheath and then took out the inner dilator.  I brought a new 28 cm palindrome catheter on the field.  This was then measured on the chest wall and in order to prevent kinking I made a new counterincision on the chest wall that was a little bit more lateral.  I then directly advanced the catheter over the Rosen wire with the large sheath in place directly under fluoroscopic vision until I placed the tip the catheter into the right atrium.  At that point time I then remove the wire and peeled-away the sheath.  I then re-tunneled the other end of the catheter through the chest wall through my new stab incision on the chest wall. Adapters were then applied.  Initially the catheter would not flush very well so had to pull it back a little bit under fluoroscopic guidance finally was able get to flush and draw back fairly easily.  The catheter was secured with 3-0 nylons in several spots.  The IJ stick site was secured with 4-0 Monocryl in subcuticular format.  Dermabond was applied everything.  I also used a 4-0 running Monocryl  subcuticular stitch to close of my initial chest wall incision that was not usable due to the angle kinking the catheter.  The catheter was loaded with  heparin according to manufacturer.    Condition: Stable  Complication: None  Marty Heck, MD [facility] Office: [phone] Pager: [place]

## 2019-06-03 NOTE — Anesthesia Preprocedure Evaluation (Addendum)
Anesthesia Evaluation  Patient identified by MRN, date of birth, ID band Patient awake    Reviewed: Allergy & Precautions, NPO status , Patient's Chart, lab work & pertinent test results  History of Anesthesia Complications Negative for: history of anesthetic complications  Airway Mallampati: III   Neck ROM: Full    Dental  (+) Dental Advisory Given, Missing   Pulmonary sleep apnea and Continuous Positive Airway Pressure Ventilation ,   Hx tracheostomy 2014 s/p removal     Pulmonary exam normal        Cardiovascular hypertension, Pt. on medications + Past MI  Normal cardiovascular exam   '20 TTE - EF >65%. Mildly increased left ventricular wall thickness. Moderate sclerosis of the aortic valve.    Neuro/Psych PSYCHIATRIC DISORDERS Anxiety negative neurological ROS     GI/Hepatic negative GI ROS, Neg liver ROS,   Endo/Other  diabetes, Type 2, Insulin DependentMorbid obesity Hyponatremia Hypochloremia Hypocalcemia   Renal/GU ESRF and DialysisRenal disease     Musculoskeletal  (+) Arthritis ,   Abdominal (+) + obese,   Peds  Hematology  (+) anemia ,   Anesthesia Other Findings Bacteremia   Reproductive/Obstetrics                            Anesthesia Physical Anesthesia Plan  ASA: III  Anesthesia Plan: General   Post-op Pain Management:    Induction: Intravenous  PONV Risk Score and Plan: 2 and Treatment may vary due to age or medical condition, Ondansetron, Dexamethasone and Midazolam  Airway Management Planned: Oral ETT and Video Laryngoscope Planned  Additional Equipment: None  Intra-op Plan:   Post-operative Plan: Extubation in OR  Informed Consent: I have reviewed the patients History and Physical, chart, labs and discussed the procedure including the risks, benefits and alternatives for the proposed anesthesia with the patient or authorized representative who has  indicated his/her understanding and acceptance.     Dental advisory given  Plan Discussed with: CRNA and Anesthesiologist  Anesthesia Plan Comments: (Grade 4 view with Mac 4 previous anesthetic, easy intubation with glidescope 3)      Anesthesia Quick Evaluation

## 2019-06-03 NOTE — Progress Notes (Signed)
RT placed pt on BIPAP V60 for the night on 12/5, 40% w/BUR 15. Pt respiratory status stable at this time. RT will continue to monitor.

## 2019-06-03 NOTE — Anesthesia Procedure Notes (Addendum)
Procedure Name: Intubation Date/Time: 06/03/2019 1:28 PM Performed by: Scheryl Darter, CRNA Pre-anesthesia Checklist: Patient identified, Emergency Drugs available, Suction available and Patient being monitored Patient Re-evaluated:Patient Re-evaluated prior to induction Oxygen Delivery Method: Circle System Utilized Preoxygenation: Pre-oxygenation with 100% oxygen Induction Type: IV induction Ventilation: Mask ventilation without difficulty Laryngoscope Size: Glidescope and 4 Grade View: Grade IV Tube type: Oral Number of attempts: 1 Airway Equipment and Method: Stylet and Oral airway Placement Confirmation: ETT inserted through vocal cords under direct vision,  positive ETCO2 and breath sounds checked- equal and bilateral Tube secured with: Tape Dental Injury: Teeth and Oropharynx as per pre-operative assessment

## 2019-06-03 NOTE — Anesthesia Postprocedure Evaluation (Signed)
Anesthesia Post Note  Patient: Seth [name]  Procedure(s) Performed: INSERTION OF DIALYSIS CATHETER (Left Chest) Transesophageal Echocardiogram D[place] Dates) (N/A Esophagus)     Patient location during evaluation: PACU Anesthesia Type: General Level of consciousness: awake and alert Pain management: pain level controlled Vital Signs Assessment: post-procedure vital signs reviewed and stable Respiratory status: spontaneous breathing, nonlabored ventilation, respiratory function stable and patient connected to nasal cannula oxygen Cardiovascular status: blood pressure returned to baseline and stable Postop Assessment: no apparent nausea or vomiting Anesthetic complications: no    Last Vitals:  Vitals:   06/03/19 1540 06/03/19 1610  BP: (!) 108/55 (!) 109/58  Pulse: 96 94  Resp: (!) 22   Temp: 36.7 C   SpO2: 96% 98%                   Audry Pili

## 2019-06-03 NOTE — Progress Notes (Signed)
[facility] for Infectious Disease  Date of Admission:  05/28/2019     Total days of antibiotics 7         ASSESSMENT/PLAN  Seth [name] is doing well with treatment for MSSA bacteremia with cefazolin with source likely being his tunneled dialysis catheter which was removed on Friday.  Blood cultures have remained without growth to date.  He is scheduled for tunneled dialysis catheter placement as well as transesophageal echocardiogram today.  We discussed the plan of care and need for continued antimicrobial therapy for 4 to 6 weeks depending upon testing results.  1.  Continue cefazolin with dialysis. 2.  Monitor cultures to ensure clearance of bacteremia. 3.  Await TEE results to determine duration of treatment. 4.  Obstructive sleep apnea treatment and equipment per primary team.   Principal Problem:   Bacteremia due to methicillin susceptible Staphylococcus aureus (MSSA) Active Problems:   Obesity hypoventilation syndrome ([place])   Hypertension associated with diabetes ([place])   End stage renal disease on dialysis ([place])   Controlled type 2 diabetes mellitus with chronic kidney disease on chronic dialysis ([name])   Hyperkalemia   Acute encephalopathy   Acute on chronic respiratory failure with hypoxia and hypercapnia ([place])   . albuterol  10 mg Nebulization Once  . aspirin EC  81 mg Oral Daily  . atorvastatin  40 mg Oral Daily  . Chlorhexidine Gluconate Cloth  6 each Topical Q0600  . doxercalciferol  1 mcg Oral Q M,W,F-HD  . ferric citrate  630 mg Oral TID WC  . heparin  5,000 Units Subcutaneous Q8H  . insulin aspart  0-20 Units Subcutaneous TID WC  . insulin aspart  0-5 Units Subcutaneous QHS  . lidocaine  10 mL Intradermal Once  . mouth rinse  15 mL Mouth Rinse BID    SUBJECTIVE:  Afebrile overnight with no acute events/concerns.  Awaiting TEE and dialysis catheter placement.  Has concerns about discharge and receiving his BiPAP machine.  No Known Allergies    Review of Systems: Review of Systems  Constitutional: Negative for chills, fever and weight loss.  Respiratory: Negative for cough, shortness of breath and wheezing.   Cardiovascular: Negative for chest pain and leg swelling.  Gastrointestinal: Negative for abdominal pain, constipation, diarrhea, nausea and vomiting.  Skin: Negative for rash.      OBJECTIVE: Vitals:   06/03/19 0123 06/03/19 0431 06/03/19 0600 06/03/19 0649  BP:  119/66    Pulse:  90 90   Resp:  20 18   Temp:  98.9 F (37.2 C)    TempSrc:  Oral    SpO2: 91% 100% 94% 94%  Weight:       Body mass index is 47.96 kg/m.  Physical Exam Constitutional:      General: He is not in acute distress.    Appearance: He is well-developed. He is obese.     Comments: Seated on the side of the bed; pleasant  Cardiovascular:     Rate and Rhythm: Normal rate and regular rhythm.     Heart sounds: Normal heart sounds.  Pulmonary:     Effort: Pulmonary effort is normal.     Breath sounds: Normal breath sounds.  Skin:    General: Skin is warm and dry.  Neurological:     Mental Status: He is alert and oriented to person, place, and time.  Psychiatric:        Mood and Affect: Mood normal.     Lab Results Lab  Results  Component Value Date   WBC 11.0 (H) 05/31/2019   HGB 12.0 (L) 05/31/2019   HCT 38.4 (L) 05/31/2019   MCV 97.0 05/31/2019   PLT 191 05/31/2019    Lab Results  Component Value Date   CREATININE 10.64 (H) 06/02/2019   BUN 80 (H) 06/02/2019   NA 132 (L) 06/02/2019   K 4.7 06/02/2019   CL 92 (L) 06/02/2019   CO2 22 06/02/2019    Lab Results  Component Value Date   ALT 14 05/29/2019   AST 24 05/29/2019   ALKPHOS 103 05/29/2019   BILITOT 1.5 (H) 05/29/2019     Microbiology: Recent Results (from the past 240 hour(s))  SARS Coronavirus 2 [facility] order, Performed in [facility] hospital lab) Nasopharyngeal Nasopharyngeal Swab     Status: None   Collection Time: 05/28/19  9:14 PM   Specimen:  Nasopharyngeal Swab  Result Value Ref Range Status   SARS Coronavirus 2 NEGATIVE NEGATIVE Final    Comment: (NOTE) If result is NEGATIVE SARS-CoV-2 target nucleic acids are NOT DETECTED. The SARS-CoV-2 RNA is generally detectable in upper and lower  respiratory specimens during the acute phase of infection. The lowest  concentration of SARS-CoV-2 viral copies this assay can detect is 250  copies / mL. A negative result does not preclude SARS-CoV-2 infection  and should not be used as the sole basis for treatment or other  patient management decisions.  A negative result may occur with  improper specimen collection / handling, submission of specimen other  than nasopharyngeal swab, presence of viral mutation(s) within the  areas targeted by this assay, and inadequate number of viral copies  (<250 copies / mL). A negative result must be combined with clinical  observations, patient history, and epidemiological information. If result is POSITIVE SARS-CoV-2 target nucleic acids are DETECTED. The SARS-CoV-2 RNA is generally detectable in upper and lower  respiratory specimens dur ing the acute phase of infection.  Positive  results are indicative of active infection with SARS-CoV-2.  Clinical  correlation with patient history and other diagnostic information is  necessary to determine patient infection status.  Positive results do  not rule out bacterial infection or co-infection with other viruses. If result is PRESUMPTIVE POSTIVE SARS-CoV-2 nucleic acids MAY BE PRESENT.   A presumptive positive result was obtained on the submitted specimen  and confirmed on repeat testing.  While 2019 novel coronavirus  (SARS-CoV-2) nucleic acids may be present in the submitted sample  additional confirmatory testing may be necessary for epidemiological  and / or clinical management purposes  to differentiate between  SARS-CoV-2 and other Sarbecovirus currently known to infect humans.  If clinically  indicated additional testing with an alternate test  methodology [phone]) is advised. The SARS-CoV-2 RNA is generally  detectable in upper and lower respiratory sp ecimens during the acute  phase of infection. The expected result is Negative. Fact Sheet for Patients:  StrictlyIdeas.no Fact Sheet for Healthcare Providers: BankingDealers.co.za This test is not yet approved or cleared by the [place] FDA and has been authorized for detection and/or diagnosis of SARS-CoV-2 by FDA under an Emergency Use Authorization (EUA).  This EUA will remain in effect (meaning this test can be used) for the duration of the COVID-19 declaration under Section 564(b)(1) of the Act, 21 U.S.C. section 360bbb-3(b)(1), unless the authorization is terminated or revoked sooner. Performed at [facility], [place] [address] [place], [place] [address]   Blood culture (routine x 2)  Status: Abnormal   Collection Time: 05/29/19  4:15 AM   Specimen: BLOOD RIGHT HAND  Result Value Ref Range Status   Specimen Description BLOOD RIGHT HAND  Final   Special Requests   Final    BOTTLES DRAWN AEROBIC ONLY Blood Culture results may not be optimal due to an inadequate volume of blood received in culture bottles   Culture  Setup Time   Final    AEROBIC BOTTLE ONLY GRAM POSITIVE COCCI CRITICAL RESULT CALLED TO, READ BACK BY AND VERIFIED WITHDion Body [facility] 05/29/19 2107 JDW Performed at [facility], [place] [address], [place], [place] [address]    Culture STAPHYLOCOCCUS AUREUS (A)  Final   Report Status 05/31/2019 FINAL  Final   Organism ID, Bacteria STAPHYLOCOCCUS AUREUS  Final      Susceptibility   Staphylococcus aureus - MIC*    CIPROFLOXACIN <=0.5 SENSITIVE Sensitive     ERYTHROMYCIN <=0.25 SENSITIVE Sensitive     GENTAMICIN <=0.5 SENSITIVE Sensitive     OXACILLIN 0.5 SENSITIVE Sensitive     TETRACYCLINE <=1 SENSITIVE Sensitive     VANCOMYCIN <=0.5  SENSITIVE Sensitive     TRIMETH/SULFA <=10 SENSITIVE Sensitive     CLINDAMYCIN <=0.25 SENSITIVE Sensitive     RIFAMPIN <=0.5 SENSITIVE Sensitive     Inducible Clindamycin NEGATIVE Sensitive     * STAPHYLOCOCCUS AUREUS  Blood Culture ID Panel (Reflexed)     Status: Abnormal   Collection Time: 05/29/19  4:15 AM  Result Value Ref Range Status   Enterococcus species NOT DETECTED NOT DETECTED Final   Listeria monocytogenes NOT DETECTED NOT DETECTED Final   Staphylococcus species DETECTED (A) NOT DETECTED Final    Comment: CRITICAL RESULT CALLED TO, READ BACK BY AND VERIFIED WITH: J MILLEN PHARMD 05/29/19 2107 JDW    Staphylococcus aureus (BCID) DETECTED (A) NOT DETECTED Final    Comment: Methicillin (oxacillin) susceptible Staphylococcus aureus (MSSA). Preferred therapy is anti staphylococcal beta lactam antibiotic (Cefazolin or Nafcillin), unless clinically contraindicated. CRITICAL RESULT CALLED TO, READ BACK BY AND VERIFIED WITH: Dion Body [facility] 05/29/19 2107 JDW    Methicillin resistance NOT DETECTED NOT DETECTED Final   Streptococcus species NOT DETECTED NOT DETECTED Final   Streptococcus agalactiae NOT DETECTED NOT DETECTED Final   Streptococcus pneumoniae NOT DETECTED NOT DETECTED Final   Streptococcus pyogenes NOT DETECTED NOT DETECTED Final   Acinetobacter baumannii NOT DETECTED NOT DETECTED Final   Enterobacteriaceae species NOT DETECTED NOT DETECTED Final   Enterobacter cloacae complex NOT DETECTED NOT DETECTED Final   Escherichia coli NOT DETECTED NOT DETECTED Final   Klebsiella oxytoca NOT DETECTED NOT DETECTED Final   Klebsiella pneumoniae NOT DETECTED NOT DETECTED Final   Proteus species NOT DETECTED NOT DETECTED Final   Serratia marcescens NOT DETECTED NOT DETECTED Final   Haemophilus influenzae NOT DETECTED NOT DETECTED Final   Neisseria meningitidis NOT DETECTED NOT DETECTED Final   Pseudomonas aeruginosa NOT DETECTED NOT DETECTED Final   Candida albicans NOT DETECTED  NOT DETECTED Final   Candida glabrata NOT DETECTED NOT DETECTED Final   Candida krusei NOT DETECTED NOT DETECTED Final   Candida parapsilosis NOT DETECTED NOT DETECTED Final   Candida tropicalis NOT DETECTED NOT DETECTED Final    Comment: Performed at [facility], [place] [address] [place], [place] [address]  MRSA PCR Screening     Status: Abnormal   Collection Time: 05/29/19  4:17 AM   Specimen: Nasal Mucosa; Nasopharyngeal  Result Value Ref Range Status  MRSA by PCR POSITIVE (A) NEGATIVE Final    Comment:        The GeneXpert MRSA Assay (FDA approved for NASAL specimens only), is one component of a comprehensive MRSA colonization surveillance program. It is not intended to diagnose MRSA infection nor to guide or monitor treatment for MRSA infections. RESULT CALLED TO, READ BACK BY AND VERIFIED WITH: PHILLIPS,T RN [phone] 05/29/2019 MITCHELL,L Performed at [facility], [place] [address], [place], [place] [address]   Blood culture (routine x 2)     Status: Abnormal   Collection Time: 05/29/19  4:23 AM   Specimen: BLOOD  Result Value Ref Range Status   Specimen Description BLOOD LEFT ANTECUBITAL  Final   Special Requests   Final    BOTTLES DRAWN AEROBIC AND ANAEROBIC Blood Culture results may not be optimal due to an inadequate volume of blood received in culture bottles   Culture  Setup Time   Final    GRAM POSITIVE COCCI CRITICAL VALUE NOTED.  VALUE IS CONSISTENT WITH PREVIOUSLY REPORTED AND CALLED VALUE. IN BOTH AEROBIC AND ANAEROBIC BOTTLES    Culture (A)  Final    STAPHYLOCOCCUS AUREUS SUSCEPTIBILITIES PERFORMED ON PREVIOUS CULTURE WITHIN THE LAST 5 DAYS. Performed at [facility], [place] [address], [place], [place] [address]    Report Status 05/31/2019 FINAL  Final  Culture, blood (routine x 2)     Status: None (Preliminary result)   Collection Time: 05/31/19  4:49 PM   Specimen: BLOOD RIGHT HAND  Result Value Ref Range Status   Specimen Description BLOOD  RIGHT HAND  Final   Special Requests   Final    BOTTLES DRAWN AEROBIC AND ANAEROBIC Blood Culture adequate volume   Culture   Final    NO GROWTH 3 DAYS Performed at [facility], [place] [name] [place], [place] [address]    Report Status PENDING  Incomplete  Culture, blood (routine x 2)     Status: None (Preliminary result)   Collection Time: 05/31/19  7:49 PM   Specimen: BLOOD  Result Value Ref Range Status   Specimen Description BLOOD SITE NOT SPECIFIED  Final   Special Requests   Final    BOTTLES DRAWN AEROBIC ONLY Blood Culture results may not be optimal due to an inadequate volume of blood received in culture bottles   Culture   Final    NO GROWTH 3 DAYS Performed at [facility], [place] [address], [place], [place] [address]    Report Status PENDING  Incomplete     Terri Piedra, [place] [facility] [place] Pager  06/03/2019  10:50 AM

## 2019-06-03 NOTE — Progress Notes (Signed)
Echocardiogram Echocardiogram Transesophageal has been performed.  Seth [name] 06/03/2019, 3:28 PM

## 2019-06-03 NOTE — Care Management Important Message (Signed)
Important Message  Patient Details  Name: Seth [name] MRN: [id number] Date of Birth: 06-Jan-1966   Medicare Important Message Given:  Yes     Naiya Corral Montine Circle 06/03/2019, 8:35 AM

## 2019-06-03 NOTE — Progress Notes (Addendum)
   Subjective: Mr. Omlor was seen at bedside this morning. He states that he is ready for his procedures today. He expressed the need for a BiPAP machine at home to help, "me from keep coming back here." All questions and concerns were addressed.   Objective: Vital signs in last 24 hours: Vitals:   06/03/19 0123 06/03/19 0431 06/03/19 0600 06/03/19 0649  BP:  119/66    Pulse:  90 90   Resp:  20 18   Temp:  98.9 F (37.2 C)    TempSrc:  Oral    SpO2: 91% 100% 94% 94%  Weight:       Physical Exam Vitals signs and nursing note reviewed.  Constitutional:      General: He is not in acute distress.    Appearance: He is obese. He is not ill-appearing, toxic-appearing or diaphoretic.  HENT:     Head: Normocephalic and atraumatic.  Cardiovascular:     Rate and Rhythm: Normal rate and regular rhythm.     Pulses: Normal pulses.     Heart sounds: Normal heart sounds. No murmur.  Pulmonary:     Effort: Pulmonary effort is normal. No respiratory distress.     Breath sounds: Normal breath sounds. No stridor. No wheezing or rhonchi.  Neurological:     Mental Status: He is alert.     Assessment/Plan:  MSSA Bacteremia: - Continuing IV Ancef.  - TTE negative for endocarditis. Plan for TEE on 8/24 at 2pm. - Tunneled HD catheter removed on 8/21 by vascular surgery. Plan to replace on 8/24.  - Repeat blood cultures from 8/21: NGTD day 3. - Appreciate coordinated care with ID, vascular, and nephrology.  ESRD on HD: - Last HD session on 8/21 - Appreciate Nephrology Recommendations - Doxercalciferol 1mg  MWF - Ferric Citrate 630mg  TID AC and 430mg  BID (between meals) - After line placement on 06/03/2019 will be placed on dialysis.    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  Diabetes: - CBGs at goal  - Continue SSI  HTN: -  Continue to hold Amlodipine  Chronic Back Pain:  - Continue home dose of oxycodone 10-325 mg PRN.   FEN: Renal Diet VTE ppx: Heparin Code Status: FULL   Dispo: Anticipated discharge pending medical course.   Maudie Mercury, MD 06/03/2019, 12:43 PM

## 2019-06-03 NOTE — CV Procedure (Signed)
    Transesophageal Echocardiogram Note  MASAYOSHI KARNITZ [id number] 30-Dec-1965  Procedure: Transesophageal Echocardiogram Indications: bacteremia  Procedure Details Consent: Obtained Time Out: Verified patient identification, verified procedure, site/side was marked, verified correct patient position, special equipment/implants available, Radiology Safety Procedures followed,  medications/allergies/relevent history reviewed, required imaging and test results available.  Performed  Medications:  The TEE was performed in the OR immediately following insertion of a Left subclavian dialysis catheter.   During this procedure the patient is administered continued anesthesia   Left Ventrical:  Normal LV function   Mitral Valve: trace MR.  No vegetations.   Aortic Valve:  Normal 3 leaflet valve.  No vegetations   Tricuspid Valve:  Mild TR,  No vegetations.   Pulmonic Valve: no large vegetations seen   Left Atrium/ Left atrial appendage: no thrombi   Atrial septum: no ASD or pfo by color doppler.   Aorta: mild calcification    Complications: No apparent complications Patient did tolerate procedure well.   Thayer Headings, Brooke Bonito., MD, [facility] 06/03/2019, 3:18 PM

## 2019-06-03 NOTE — Progress Notes (Signed)
[facility]   Subjective  -no complaints. .   Objective 119/66 90 98.9 F (37.2 C) (Oral) 18 94%  Intake/Output Summary (Last 24 hours) at 06/03/2019 1236 Last data filed at 06/03/2019 0700 Gross per 24 hour  Intake 50 ml  Output 0 ml  Net 50 ml    Left basilic vein fistula with good thrill  Laboratory Lab Results: No results for input(s): WBC, HGB, HCT, PLT in the last 72 hours. BMET Recent Labs    06/01/19 0813 06/02/19 1209  NA 133* 132*  K 4.4 4.7  CL 97* 92*  CO2 17* 22  GLUCOSE 170* 162*  BUN 49* 80*  CREATININE 8.56* 10.64*  CALCIUM 7.6* 8.0*    COAG Lab Results  Component Value Date   INR 1.02 11/06/2017   INR 1.32 07/14/2015   INR 1.25 07/12/2015   No results found for: PTT  Assessment/Planning:  53 year old male that had bacteremia and vascular surgery was asked to remove his left IJ tunneled TDC on Friday.  He had a line holiday through the weekend.  Scheduled today for placement of a new TDC after line holiday.  Marty Heck 06/03/2019 12:36 PM --

## 2019-06-03 NOTE — Transfer of Care (Signed)
Immediate Anesthesia Transfer of Care Note  Patient: Seth [name]  Procedure(s) Performed: INSERTION OF DIALYSIS CATHETER (Left Chest) Transesophageal Echocardiogram Darden Dates) (N/A Esophagus)  Patient Location: PACU  Anesthesia Type:General  Level of Consciousness: awake, alert  and oriented  Airway & Oxygen Therapy: Patient Spontanous Breathing and Patient connected to face mask oxygen  Post-op Assessment: Report given to RN and Post -op Vital signs reviewed and stable  Post vital signs: Reviewed and stable  Last Vitals:  Vitals Value Taken Time  BP 115/90 06/03/19 1509  Temp    Pulse 96 06/03/19 1515  Resp 19 06/03/19 1515  SpO2 94 % 06/03/19 1515  Vitals shown include unvalidated device data.  Last Pain:  Vitals:   06/03/19 0431  TempSrc: Oral  PainSc:          Complications: No apparent anesthesia complications

## 2019-06-04 ENCOUNTER — Telehealth: Payer: Self-pay | Admitting: Internal Medicine

## 2019-06-04 ENCOUNTER — Inpatient Hospital Stay (HOSPITAL_COMMUNITY): Payer: No Typology Code available for payment source

## 2019-06-04 ENCOUNTER — Encounter (HOSPITAL_COMMUNITY): Payer: Self-pay | Admitting: Vascular Surgery

## 2019-06-04 ENCOUNTER — Ambulatory Visit: Payer: No Typology Code available for payment source

## 2019-06-04 DIAGNOSIS — N186 End stage renal disease: Secondary | ICD-10-CM

## 2019-06-04 LAB — RENAL FUNCTION PANEL
Albumin: 3.1 g/dL — ABNORMAL LOW (ref 3.5–5.0)
Anion gap: 13 (ref 5–15)
BUN: 55 mg/dL — ABNORMAL HIGH (ref 6–20)
CO2: 25 mmol/L (ref 22–32)
Calcium: 8.1 mg/dL — ABNORMAL LOW (ref 8.9–10.3)
Chloride: 94 mmol/L — ABNORMAL LOW (ref 98–111)
Creatinine, Ser: 9.1 mg/dL — ABNORMAL HIGH (ref 0.61–1.24)
GFR calc Af Amer: 7 mL/min — ABNORMAL LOW (ref >60)
GFR calc non Af Amer: 6 mL/min — ABNORMAL LOW (ref >60)
Glucose, Bld: 216 mg/dL — ABNORMAL HIGH (ref 70–99)
Phosphorus: 5.9 mg/dL — ABNORMAL HIGH (ref 2.5–4.6)
Potassium: 4 mmol/L (ref 3.5–5.1)
Sodium: 132 mmol/L — ABNORMAL LOW (ref 135–145)

## 2019-06-04 LAB — GLUCOSE, CAPILLARY
Glucose-Capillary: 224 mg/dL — ABNORMAL HIGH (ref 70–99)
Glucose-Capillary: 194 mg/dL — ABNORMAL HIGH (ref 70–99)
Glucose-Capillary: 149 mg/dL — ABNORMAL HIGH (ref 70–99)
Glucose-Capillary: 154 mg/dL — ABNORMAL HIGH (ref 70–99)

## 2019-06-04 MED ORDER — CEFAZOLIN IV (FOR PTA / DISCHARGE USE ONLY): 2.0000 g | INTRAVENOUS | 0 refills | Status: AC

## 2019-06-04 MED ORDER — ORAL CARE MOUTH RINSE
15.0000 mL | Freq: Two times a day (BID) | OROMUCOSAL | Status: DC
  Administered 2019-06-04 (×2): 15 mL via OROMUCOSAL

## 2019-06-04 MED ORDER — CEFAZOLIN SODIUM-DEXTROSE 2-4 GM/100ML-% IV SOLN
2.0000 g | INTRAVENOUS | Status: DC
  Administered 2019-06-05: 21:00:00 2 g via INTRAVENOUS
  Filled 2019-06-04: qty 100

## 2019-06-04 MED ORDER — CEFAZOLIN SODIUM-DEXTROSE 1-4 GM/50ML-% IV SOLN
1.0000 g | Freq: Once | INTRAVENOUS | Status: AC
  Administered 2019-06-04: 1 g via INTRAVENOUS
  Filled 2019-06-04: qty 50

## 2019-06-04 MED ORDER — CEFAZOLIN IV (FOR PTA / DISCHARGE USE ONLY): 2.0000 g | INTRAVENOUS | 11 refills | Status: DC

## 2019-06-04 MED ORDER — CHLORHEXIDINE GLUCONATE 0.12 % MT SOLN
15.0000 mL | Freq: Two times a day (BID) | OROMUCOSAL | Status: DC
  Administered 2019-06-04 – 2019-06-06 (×4): 15 mL via OROMUCOSAL
  Filled 2019-06-04 (×5): qty 15

## 2019-06-04 NOTE — Progress Notes (Signed)
This RT stuck pt twice in the RR for an ABG, was unsuccessful. Pt is restricted to left side.

## 2019-06-04 NOTE — Discharge Summary (Addendum)
Name: Seth [name] MRN: [id number] DOB: [birth year] 53 y.o. PCP: Mitzi Hansen, MD  Date of Admission: 05/28/2019  8:46 PM Date of Discharge: 06/04/2019 Attending Physician: Lucious Groves, DO  Discharge Diagnosis: 1. Acute on chronic hypercarbic respiratory failure 2. MSSA bacteremia 3. ESRD  Discharge Medications: Allergies as of 06/04/2019   No Known Allergies      Medication List     TAKE these medications    amLODipine 10 MG tablet Commonly known as: NORVASC Take 1 tablet (10 mg total) by mouth daily.   aspirin EC 81 MG tablet Take 81 mg by mouth daily.   atorvastatin 40 MG tablet Commonly known as: LIPITOR Take 40 mg by mouth daily.   Auryxia 1 GM 210 MG(Fe) tablet Generic drug: ferric citrate Take 420-630 mg by mouth See admin instructions. Take 3 tablets (630 mg) by mouth with meals and 2 tablets (420 mg) with snacks   ceFAZolin  IVPB Commonly known as: ANCEF Inject 2 g into the vein every Monday, Wednesday, and Friday. Indication: MSSA Bactermia  Last Day of Therapy:  06/28/2019 Start taking on: June 05, 2019   diclofenac sodium 1 % Gel Commonly known as: VOLTAREN Apply 4 g topically 4 (four) times daily.   NovoLOG FlexPen 100 UNIT/ML FlexPen Generic drug: insulin aspart Inject 0-20 Units into the skin 3 (three) times daily with meals.   oxyCODONE-acetaminophen 10-325 MG tablet Commonly known as: PERCOCET Take 1 tablet by mouth every 6 (six) hours as needed for pain. Please refill 30 days after last RX fill. What changed:  when to take this additional instructions   Ozempic (0.25 or 0.5 MG/DOSE) 2 MG/1.5ML Sopn Generic drug: Semaglutide(0.25 or 0.5MG /DOS) Inject 0.25 mg into the skin once a week.   senna 8.6 MG tablet Commonly known as: SENOKOT Take 1 tablet by mouth daily.   Tyler Aas FlexTouch 100 UNIT/ML Sopn FlexTouch Pen Generic drug: insulin degludec Inject 0.14 mLs (14 Units total) into the skin daily.                Home Infusion Instuctions  (From admission, onward)           Start     Ordered   06/04/19 0000  Home infusion instructions [facility] May follow [place] Dosing Protocol; May administer Cathflo as needed to maintain patency of vascular access device.; Flushing of vascular access device: per [facility] Protocol: 0.9% NaCl pre/post medica...    Question Answer Comment  Instructions May follow [place] Dosing Protocol   Instructions May administer Cathflo as needed to maintain patency of vascular access device.   Instructions Flushing of vascular access device: per [facility] Protocol: 0.9% NaCl pre/post medication administration and prn patency; Heparin 100 u/ml, 40ml for implanted ports and Heparin 10u/ml, 50ml for all other central venous catheters.   Instructions May follow [facility] Anaphylaxis Protocol for First Dose Administration in the home: 0.9% NaCl at 25-50 ml/hr to maintain IV access for protocol meds. Epinephrine 0.3 ml IV/IM PRN and Benadryl 25-50 IV/IM PRN s/s of anaphylaxis.   Instructions [facility] Infusion Coordinator (RN) to assist per patient IV care needs in the home PRN.      06/04/19 1131          Disposition and follow-up:   Mr.Oak B Mulvenna was discharged from [facility] in Stable condition.  At the hospital follow up visit please address:  1.  Obesity Hypoventilation Syndrome. Ensure the patient has obtained a BiPAP  from the [place]. MSSA Bacteremia. Remind the patient of his follow-up with ID on 07/11/2019.   2.  Labs / imaging needed at time of follow-up: None  3.  Pending labs/ test needing follow-up: None  Follow-up Appointments: Follow-up Information     Golden Circle, FNP Follow up.   Specialties: Family Medicine, Infectious Diseases Why: 07/11/2019 at 10am. Please call to reschedule if you are not able to make this appointment.  Contact information: [place] [address] [phone]          Mitzi Hansen, MD Follow up on 06/11/2019.   Specialty: Internal Medicine Why: 10:45 Contact information: [address] [place] [address] [phone]         Marty Heck, MD. Call.   Specialty: Vascular Surgery Contact information: [place] [address] [phone]          Hospital Course by problem list:  1. Acute on chronic hypercarbic respiratory failure suspected secondary to Obesity Hypoventilation syndrome. Seth [name] is a 53 year old male with ESRD on hemodialysis, type II diabetes, and obstructive sleep apnea/obesity hyperventilation syndrome who originally presented to the emergency department on 8/19 with altered mental status. He was initially found to be hyperkalemia can hyperkalemia and therefore taken for emergent hemodialysis. While in hemodialysis he subsequently became unresponsive. An ABG illustrated severe respiratory acidosis and he was subsequently intubated and taken to the ICU. Respiratory status was optimized and he was extubated on 8/20 directly to BiPAP. He was monitored overnight before being transferred back out of the ICU. The cause of his acute on chronic hyper cardiac respiratory failure was thought to be secondary to not having his noninvasive positive pressure ventilation at home. Pulmonology felt that in order to reduce recurrent admissions and provide mortality benefit that the patient would need a noninvasive ventilator at home. Case management was consulted to help facilitate this.  2. MSSA bacteremia. While hospitalized the patient's blood cultures subsequently returned positive for MSSA bacteremia. TTE was obtained that illustrated no evidence of endocarditis; however, given that he is a dialysis patient a TEE was pursued. This to illustrated no evidence of endocarditis. Both results can be seen below. Also had a left subclavian tunnel hemodialysis catheter that was removed on 8/21 and after a 48 hour  holiday was reinserted on 8/24. Follow-up blood cultures from 8/21 remain with no growth to date and will therefore serve as our start date to antibiotics. Infectious disease has recommended four weeks of IV Ancef with a stop date of 06/28/2019. They will plan for repeat surveillance cultures two weeks following the completion of treatment. He already has a follow-up appointment scheduled with infectious disease 07/11/2019.  3. ESRD. During the patient's hospitalization he maintained his normal hemodialysis schedule. He was originally receiving hemodialysis through a left subclavian tunnel to hemodialysis catheter; however, given his MSSA bacteremia this was removed and after line holiday reinserted. Does have a left arm fistula but this is been difficult to use due to depth. Vascular surgery is involved and obtained a duplex of the left arm for further evaluation. He will follow-up with nephrology and vascular surgery as an outpatient.  Discharge Vitals:   BP 113/88 (BP Location: Left Leg)   Pulse 88   Temp 97.6 F (36.4 C) (Oral)   Resp (!) 24   Ht 5\' 7"  (1.702 m)   Wt (!) 140.9 kg   SpO2 93%   BMI 48.65 kg/m   Pertinent  Labs, Studies, and Procedures:  Results for orders placed or performed during the hospital encounter of 05/28/19  SARS Coronavirus 2 [facility] order, Performed in [facility] hospital lab) Nasopharyngeal Nasopharyngeal Swab     Status: None   Collection Time: 05/28/19  9:14 PM   Specimen: Nasopharyngeal Swab  Result Value Ref Range Status   SARS Coronavirus 2 NEGATIVE NEGATIVE Final    Comment: (NOTE) If result is NEGATIVE SARS-CoV-2 target nucleic acids are NOT DETECTED. The SARS-CoV-2 RNA is generally detectable in upper and lower  respiratory specimens during the acute phase of infection. The lowest  concentration of SARS-CoV-2 viral copies this assay can detect is 250  copies / mL. A negative result does not preclude SARS-CoV-2 infection  and should not be used as  the sole basis for treatment or other  patient management decisions.  A negative result may occur with  improper specimen collection / handling, submission of specimen other  than nasopharyngeal swab, presence of viral mutation(s) within the  areas targeted by this assay, and inadequate number of viral copies  (<250 copies / mL). A negative result must be combined with clinical  observations, patient history, and epidemiological information. If result is POSITIVE SARS-CoV-2 target nucleic acids are DETECTED. The SARS-CoV-2 RNA is generally detectable in upper and lower  respiratory specimens dur ing the acute phase of infection.  Positive  results are indicative of active infection with SARS-CoV-2.  Clinical  correlation with patient history and other diagnostic information is  necessary to determine patient infection status.  Positive results do  not rule out bacterial infection or co-infection with other viruses. If result is PRESUMPTIVE POSTIVE SARS-CoV-2 nucleic acids MAY BE PRESENT.   A presumptive positive result was obtained on the submitted specimen  and confirmed on repeat testing.  While 2019 novel coronavirus  (SARS-CoV-2) nucleic acids may be present in the submitted sample  additional confirmatory testing may be necessary for epidemiological  and / or clinical management purposes  to differentiate between  SARS-CoV-2 and other Sarbecovirus currently known to infect humans.  If clinically indicated additional testing with an alternate test  methodology [phone]) is advised. The SARS-CoV-2 RNA is generally  detectable in upper and lower respiratory sp ecimens during the acute  phase of infection. The expected result is Negative. Fact Sheet for Patients:  StrictlyIdeas.no Fact Sheet for Healthcare Providers: BankingDealers.co.za This test is not yet approved or cleared by the [place] FDA and has been authorized for  detection and/or diagnosis of SARS-CoV-2 by FDA under an Emergency Use Authorization (EUA).  This EUA will remain in effect (meaning this test can be used) for the duration of the COVID-19 declaration under Section 564(b)(1) of the Act, 21 U.S.C. section 360bbb-3(b)(1), unless the authorization is terminated or revoked sooner. Performed at [facility], [place] [address], [place], [place] [address]   Blood culture (routine x 2)     Status: Abnormal   Collection Time: 05/29/19  4:15 AM   Specimen: BLOOD RIGHT HAND  Result Value Ref Range Status   Specimen Description BLOOD RIGHT HAND  Final   Special Requests   Final    BOTTLES DRAWN AEROBIC ONLY Blood Culture results may not be optimal due to an inadequate volume of blood received in culture bottles   Culture  Setup Time   Final    AEROBIC BOTTLE ONLY GRAM POSITIVE COCCI CRITICAL RESULT CALLED TO, READ BACK BY AND VERIFIED WITHDion Body [facility] 05/29/19 2107 JDW Performed at  [facility], [place] [address] [place], [place] [address]    Culture STAPHYLOCOCCUS AUREUS (A)  Final   Report Status 05/31/2019 FINAL  Final   Organism ID, Bacteria STAPHYLOCOCCUS AUREUS  Final      Susceptibility   Staphylococcus aureus - MIC*    CIPROFLOXACIN <=0.5 SENSITIVE Sensitive     ERYTHROMYCIN <=0.25 SENSITIVE Sensitive     GENTAMICIN <=0.5 SENSITIVE Sensitive     OXACILLIN 0.5 SENSITIVE Sensitive     TETRACYCLINE <=1 SENSITIVE Sensitive     VANCOMYCIN <=0.5 SENSITIVE Sensitive     TRIMETH/SULFA <=10 SENSITIVE Sensitive     CLINDAMYCIN <=0.25 SENSITIVE Sensitive     RIFAMPIN <=0.5 SENSITIVE Sensitive     Inducible Clindamycin NEGATIVE Sensitive     * STAPHYLOCOCCUS AUREUS  Blood Culture ID Panel (Reflexed)     Status: Abnormal   Collection Time: 05/29/19  4:15 AM  Result Value Ref Range Status   Enterococcus species NOT DETECTED NOT DETECTED Final   Listeria monocytogenes NOT DETECTED NOT DETECTED Final   Staphylococcus species DETECTED  (A) NOT DETECTED Final    Comment: CRITICAL RESULT CALLED TO, READ BACK BY AND VERIFIED WITH: J MILLEN PHARMD 05/29/19 2107 JDW    Staphylococcus aureus (BCID) DETECTED (A) NOT DETECTED Final    Comment: Methicillin (oxacillin) susceptible Staphylococcus aureus (MSSA). Preferred therapy is anti staphylococcal beta lactam antibiotic (Cefazolin or Nafcillin), unless clinically contraindicated. CRITICAL RESULT CALLED TO, READ BACK BY AND VERIFIED WITH: Dion Body [facility] 05/29/19 2107 JDW    Methicillin resistance NOT DETECTED NOT DETECTED Final   Streptococcus species NOT DETECTED NOT DETECTED Final   Streptococcus agalactiae NOT DETECTED NOT DETECTED Final   Streptococcus pneumoniae NOT DETECTED NOT DETECTED Final   Streptococcus pyogenes NOT DETECTED NOT DETECTED Final   Acinetobacter baumannii NOT DETECTED NOT DETECTED Final   Enterobacteriaceae species NOT DETECTED NOT DETECTED Final   Enterobacter cloacae complex NOT DETECTED NOT DETECTED Final   Escherichia coli NOT DETECTED NOT DETECTED Final   Klebsiella oxytoca NOT DETECTED NOT DETECTED Final   Klebsiella pneumoniae NOT DETECTED NOT DETECTED Final   Proteus species NOT DETECTED NOT DETECTED Final   Serratia marcescens NOT DETECTED NOT DETECTED Final   Haemophilus influenzae NOT DETECTED NOT DETECTED Final   Neisseria meningitidis NOT DETECTED NOT DETECTED Final   Pseudomonas aeruginosa NOT DETECTED NOT DETECTED Final   Candida albicans NOT DETECTED NOT DETECTED Final   Candida glabrata NOT DETECTED NOT DETECTED Final   Candida krusei NOT DETECTED NOT DETECTED Final   Candida parapsilosis NOT DETECTED NOT DETECTED Final   Candida tropicalis NOT DETECTED NOT DETECTED Final    Comment: Performed at [facility], [place] [address], [place], [place] [address]  MRSA PCR Screening     Status: Abnormal   Collection Time: 05/29/19  4:17 AM   Specimen: Nasal Mucosa; Nasopharyngeal  Result Value Ref Range Status   MRSA by PCR POSITIVE  (A) NEGATIVE Final    Comment:        The GeneXpert MRSA Assay (FDA approved for NASAL specimens only), is one component of a comprehensive MRSA colonization surveillance program. It is not intended to diagnose MRSA infection nor to guide or monitor treatment for MRSA infections. RESULT CALLED TO, READ BACK BY AND VERIFIED WITH: PHILLIPS,T RN [phone] 05/29/2019 MITCHELL,L Performed at [facility], [place] [address] [place], [place] [address]   Blood culture (routine x 2)     Status: Abnormal   Collection Time:  05/29/19  4:23 AM   Specimen: BLOOD  Result Value Ref Range Status   Specimen Description BLOOD LEFT ANTECUBITAL  Final   Special Requests   Final    BOTTLES DRAWN AEROBIC AND ANAEROBIC Blood Culture results may not be optimal due to an inadequate volume of blood received in culture bottles   Culture  Setup Time   Final    GRAM POSITIVE COCCI CRITICAL VALUE NOTED.  VALUE IS CONSISTENT WITH PREVIOUSLY REPORTED AND CALLED VALUE. IN BOTH AEROBIC AND ANAEROBIC BOTTLES    Culture (A)  Final    STAPHYLOCOCCUS AUREUS SUSCEPTIBILITIES PERFORMED ON PREVIOUS CULTURE WITHIN THE LAST 5 DAYS. Performed at [facility], [place] [address], [name], [place] [address]    Report Status 05/31/2019 FINAL  Final  Culture, blood (routine x 2)     Status: None (Preliminary result)   Collection Time: 05/31/19  4:49 PM   Specimen: BLOOD RIGHT HAND  Result Value Ref Range Status   Specimen Description BLOOD RIGHT HAND  Final   Special Requests   Final    BOTTLES DRAWN AEROBIC AND ANAEROBIC Blood Culture adequate volume   Culture   Final    NO GROWTH 4 DAYS Performed at [facility], [place] [address], [place], [place] [address]    Report Status PENDING  Incomplete  Culture, blood (routine x 2)     Status: None (Preliminary result)   Collection Time: 05/31/19  7:49 PM   Specimen: BLOOD  Result Value Ref Range Status   Specimen Description BLOOD SITE NOT SPECIFIED  Final   Special  Requests   Final    BOTTLES DRAWN AEROBIC ONLY Blood Culture results may not be optimal due to an inadequate volume of blood received in culture bottles   Culture   Final    NO GROWTH 4 DAYS Performed at [facility], [place] [address] [place], [place] [address]    Report Status PENDING  Incomplete   TTE 05/30/2019  1. The left ventricle has hyperdynamic systolic function, with an ejection fraction of >65%. The cavity size was normal. There is mildly increased left ventricular wall thickness. Left ventricular diastolic function could not be evaluated due to nondiagnostic images. No evidence of left ventricular regional wall motion abnormalities.  2. The right ventricle has normal systolic function. The cavity was normal. There is no increase in right ventricular wall thickness.  3. The aortic valve is tricuspid. Moderate sclerosis of the aortic valve.  4. The aorta is normal unless otherwise noted.  TEE 06/03/2019  1. No mitral valve vegetation visualized.  2. No vegetation on the aortic valve.  3. The aortic valve is grossly normal.  4. The aortic root, descending aorta and ascending aorta are normal in size and structure.  5. The left ventricle has normal systolic function, with an ejection fraction of 60-65%.  Discharge Instructions: Discharge Instructions     Call MD for:  temperature >100.4   Complete by: As directed    Diet - low sodium heart healthy   Complete by: As directed    Home infusion instructions [facility] May follow [place] Dosing Protocol; May administer Cathflo as needed to maintain patency of vascular access device.; Flushing of vascular access device: per [facility] Protocol: 0.9% NaCl pre/post medica...   Complete by: As directed    Instructions: May follow [place] Dosing Protocol   Instructions: May administer Cathflo as needed to maintain patency of vascular access device.  Instructions: Flushing of vascular access device: per [facility] Protocol: 0.9%  NaCl pre/post medication administration and prn patency; Heparin 100 u/ml, 31ml for implanted ports and Heparin 10u/ml, 41ml for all other central venous catheters.   Instructions: May follow [facility] Anaphylaxis Protocol for First Dose Administration in the home: 0.9% NaCl at 25-50 ml/hr to maintain IV access for protocol meds. Epinephrine 0.3 ml IV/IM PRN and Benadryl 25-50 IV/IM PRN s/s of anaphylaxis.   Instructions: [place] Infusion Coordinator (RN) to assist per patient IV care needs in the home PRN.   Increase activity slowly   Complete by: As directed      Signed: Ina Homes, MD 06/04/2019, 11:32 AM

## 2019-06-04 NOTE — Progress Notes (Signed)
   06/03/19 2257  MEWS Score  Resp 16  Pulse Rate 82  BP (!) 157/99  Temp 98 F (36.7 C)  SpO2 95 %  O2 Device Room Air  MEWS Score  MEWS RR 0  MEWS Pulse 0  MEWS Systolic 0  MEWS LOC 0  MEWS Temp 0  MEWS Score 0  MEWS Score Color Green  MEWS Assessment  Is this an acute change? No  MEWS Guidelines - (patients age 55 and over)  Red - At High Risk for Deterioration Yellow - At risk for Deterioration  1. Go to room and assess patient 2. Validate data. Is this patient's baseline? If data confirmed: 3. Is this an acute change? 4. Administer prn meds/treatments as ordered. 5. Note Sepsis score 6. Review goals of care 7. Sports coach, RRT nurse and Provider. 8. Ask Provider to come to bedside.  9. Document patient condition/interventions/response. 10. Increase frequency of vital signs and focused assessments to at least q15 minutes x 4, then q30 minutes x2. - If stable, then q1h x3, then q4h x3 and then q8h or dept. routine. - If unstable, contact Provider & RRT nurse. Prepare for possible transfer. 11. Add entry in progress notes using the smart phrase ".MEWS". 1. Go to room and assess patient 2. Validate data. Is this patient's baseline? If data confirmed: 3. Is this an acute change? 4. Administer prn meds/treatments as ordered? 5. Note Sepsis score 6. Review goals of care 7. Sports coach and Provider 8. Call RRT nurse as needed. 9. Document patient condition/interventions/response. 10. Increase frequency of vital signs and focused assessments to at least q2h x2. - If stable, then q4h x2 and then q8h or dept. routine. - If unstable, contact Provider & RRT nurse. Prepare for possible transfer. 11. Add entry in progress notes using the smart phrase ".MEWS".  Green - Likely stable Lavender - Comfort Care Only  1. Continue routine/ordered monitoring.  2. Review goals of care. 1. Continue routine/ordered monitoring. 2. Review goals of care.

## 2019-06-04 NOTE — Progress Notes (Signed)
PHARMACY CONSULT NOTE FOR:  OUTPATIENT  PARENTERAL ANTIBIOTIC THERAPY (OPAT)  Indication: MSSA bacteremia  Regimen: Cefazolin 2 grams IV with hemodialysis on MWF  End date: 06/28/2019  IV antibiotic discharge orders are pended. To discharging provider:  please sign these orders via discharge navigator,  Select New Orders & click on the button choice - Manage This Unsigned Work.     Thank you for allowing pharmacy to be a part of this patient's care.  Thank you,   Eddie Candle, PharmD PGY-1 Pharmacy Resident

## 2019-06-04 NOTE — Progress Notes (Signed)
[facility] ROUNDING NOTE   Subjective:   This is a 53 year old gentleman end-stage renal disease hemodialysis [facility] Monday Wednesday Friday sleep apnea presented to the emergency room with the altered mental status.  He was diagnosed with MSSA bacteremia.  TEE 8/24 was negative for endocarditis.  Blood cultures no growth to date.  Infectious disease recommending surveillance cultures.  Appreciate assistance from Mauricio Po with the infectious disease team.  Line holiday over the weekend.  Replaced HD catheter tunneled 06/03/2019.  Underwent successful dialysis 06/03/2019 with removal of 3 L.  Blood pressure 113/88 pulse 86 temperature 97.5  Sodium 132 potassium 4.0 chloride 94 CO2 25 BUN 35 creatinine 9.1 glucose 216.  Calcium 8.1 phosphorus 5.9 albumin 3.1.  WBC 10.88 hemoglobin 11.0 platelets 224  Aspirin 81 mg Lipitor 40 mg daily, Hectorol 1 mcg Monday Wednesday Friday, Auryxia 630 mg 3 times daily with meals.  Ancef 1 g daily.  Insulin sliding scale  Objective:  Vital signs in last 24 hours:  Temp:  [97.5 F (36.4 C)-98.2 F (36.8 C)] 97.6 F (36.4 C) (08/25 0527) Pulse Rate:  [74-98] 86 (08/25 0527) Resp:  [16-28] 21 (08/25 0527) BP: (93-157)/(47-99) 113/88 (08/25 0527) SpO2:  [92 %-100 %] 92 % (08/25 0527) Weight:  [140.9 kg-144 kg] 140.9 kg (08/25 0500)  Weight change:  Filed Weights   06/03/19 1815 06/03/19 2225 06/04/19 0500  Weight: (!) 144 kg (!) 141 kg (!) 140.9 kg    Intake/Output: I/O last 3 completed shifts: In: 602.9 [I.V.:502.9; IV Piggyback:100] Out: [id number] [Other:3000; Blood:50]   Intake/Output this shift:  No intake/output data recorded.  General adult male in bed in no acute distress HEENT normocephalic atraumatic extraocular movements intact sclera anicteric Neck increased neck circumference; trachea midline Lungs clear and unlabored  Heart RRR; no rub Abdomen soft nontender obese habitus  Extremities no to trace lower  extremity edema  Psych normal mood and affect Neuro - alert and oriented x 3 Access: LUE AVF with bruit and thrill     Basic Metabolic Panel: Recent Labs  Lab 05/31/19 0700 06/01/19 0813 06/02/19 1209 06/03/19 1840 06/04/19 0420  NA 131* 133* 132* 134* 132*  K 5.0 4.4 4.7 4.5 4.0  CL 93* 97* 92* 96* 94*  CO2 19* 17* 22 21* 25  GLUCOSE 191* 170* 162* 178* 216*  BUN 73* 49* 80* 96* 55*  CREATININE 11.64* 8.56* 10.64* 11.97* 9.10*  CALCIUM 7.2* 7.6* 8.0* 8.0* 8.1*  PHOS 9.1* 6.4* 5.0* 6.1* 5.9*    Liver Function Tests: Recent Labs  Lab 05/28/19 2157  05/29/19 0415  05/31/19 0700 06/01/19 0813 06/02/19 1209 06/03/19 1840 06/04/19 0420  AST 16  --  24  --   --   --   --   --   --   ALT 12  --  14  --   --   --   --   --   --   ALKPHOS 111  --  103  --   --   --   --   --   --   BILITOT 0.6  --  1.5*  --   --   --   --   --   --   PROT 9.1*  --  9.0*  --   --   --   --   --   --   ALBUMIN 3.4*   < > 3.4*   < > 2.7* 3.0* 3.3* 2.9* 3.1*   < > =  values in this interval not displayed.   No results for input(s): LIPASE, AMYLASE in the last 168 hours. No results for input(s): AMMONIA in the last 168 hours.  CBC: Recent Labs  Lab 05/28/19 2157  05/29/19 0055 05/29/19 0415 05/29/19 0543 05/29/19 0731 05/31/19 0700 06/03/19 1840  WBC 14.0*  --  13.2* 17.5*  --  19.8* 11.0* 10.8*  NEUTROABS 12.0*  --   --   --   --   --   --   --   HGB 12.3*   < > 12.0* 12.6* 13.6 11.5* 12.0* 11.0*  HCT 39.2   < > 39.3 40.8 40.0 36.8* 38.4* 37.1*  MCV 98.0  --  98.3 98.1  --  96.8 97.0 99.2  PLT 192  --  182 190  --  167 191 224   < > = values in this interval not displayed.    Cardiac Enzymes: No results for input(s): CKTOTAL, CKMB, CKMBINDEX, TROPONINI in the last 168 hours.  BNP: Invalid input(s): POCBNP  CBG: Recent Labs  Lab 06/02/19 2313 06/03/19 0850 06/03/19 1158 06/03/19 1510 06/03/19 2254  GLUCAP 171* 166* 136* 132* 148*    Microbiology: Results for orders  placed or performed during the hospital encounter of 05/28/19  SARS Coronavirus 2 [facility] order, Performed in [facility] hospital lab) Nasopharyngeal Nasopharyngeal Swab     Status: None   Collection Time: 05/28/19  9:14 PM   Specimen: Nasopharyngeal Swab  Result Value Ref Range Status   SARS Coronavirus 2 NEGATIVE NEGATIVE Final    Comment: (NOTE) If result is NEGATIVE SARS-CoV-2 target nucleic acids are NOT DETECTED. The SARS-CoV-2 RNA is generally detectable in upper and lower  respiratory specimens during the acute phase of infection. The lowest  concentration of SARS-CoV-2 viral copies this assay can detect is 250  copies / mL. A negative result does not preclude SARS-CoV-2 infection  and should not be used as the sole basis for treatment or other  patient management decisions.  A negative result may occur with  improper specimen collection / handling, submission of specimen other  than nasopharyngeal swab, presence of viral mutation(s) within the  areas targeted by this assay, and inadequate number of viral copies  (<250 copies / mL). A negative result must be combined with clinical  observations, patient history, and epidemiological information. If result is POSITIVE SARS-CoV-2 target nucleic acids are DETECTED. The SARS-CoV-2 RNA is generally detectable in upper and lower  respiratory specimens dur ing the acute phase of infection.  Positive  results are indicative of active infection with SARS-CoV-2.  Clinical  correlation with patient history and other diagnostic information is  necessary to determine patient infection status.  Positive results do  not rule out bacterial infection or co-infection with other viruses. If result is PRESUMPTIVE POSTIVE SARS-CoV-2 nucleic acids MAY BE PRESENT.   A presumptive positive result was obtained on the submitted specimen  and confirmed on repeat testing.  While 2019 novel coronavirus  (SARS-CoV-2) nucleic acids may be present in the  submitted sample  additional confirmatory testing may be necessary for epidemiological  and / or clinical management purposes  to differentiate between  SARS-CoV-2 and other Sarbecovirus currently known to infect humans.  If clinically indicated additional testing with an alternate test  methodology [phone]) is advised. The SARS-CoV-2 RNA is generally  detectable in upper and lower respiratory sp ecimens during the acute  phase of infection. The expected result is Negative. Fact Sheet for Patients:  StrictlyIdeas.no  Fact Sheet for Healthcare Providers: BankingDealers.co.za This test is not yet approved or cleared by the [place] FDA and has been authorized for detection and/or diagnosis of SARS-CoV-2 by FDA under an Emergency Use Authorization (EUA).  This EUA will remain in effect (meaning this test can be used) for the duration of the COVID-19 declaration under Section 564(b)(1) of the Act, 21 U.S.C. section 360bbb-3(b)(1), unless the authorization is terminated or revoked sooner. Performed at [facility], [place] [address], [place], [place] [address]   Blood culture (routine x 2)     Status: Abnormal   Collection Time: 05/29/19  4:15 AM   Specimen: BLOOD RIGHT HAND  Result Value Ref Range Status   Specimen Description BLOOD RIGHT HAND  Final   Special Requests   Final    BOTTLES DRAWN AEROBIC ONLY Blood Culture results may not be optimal due to an inadequate volume of blood received in culture bottles   Culture  Setup Time   Final    AEROBIC BOTTLE ONLY GRAM POSITIVE COCCI CRITICAL RESULT CALLED TO, READ BACK BY AND VERIFIED WITHDion Body [facility] 05/29/19 2107 JDW Performed at [facility], [place] [address], [place], [place] [address]    Culture STAPHYLOCOCCUS AUREUS (A)  Final   Report Status 05/31/2019 FINAL  Final   Organism ID, Bacteria STAPHYLOCOCCUS AUREUS  Final      Susceptibility   Staphylococcus aureus -  MIC*    CIPROFLOXACIN <=0.5 SENSITIVE Sensitive     ERYTHROMYCIN <=0.25 SENSITIVE Sensitive     GENTAMICIN <=0.5 SENSITIVE Sensitive     OXACILLIN 0.5 SENSITIVE Sensitive     TETRACYCLINE <=1 SENSITIVE Sensitive     VANCOMYCIN <=0.5 SENSITIVE Sensitive     TRIMETH/SULFA <=10 SENSITIVE Sensitive     CLINDAMYCIN <=0.25 SENSITIVE Sensitive     RIFAMPIN <=0.5 SENSITIVE Sensitive     Inducible Clindamycin NEGATIVE Sensitive     * STAPHYLOCOCCUS AUREUS  Blood Culture ID Panel (Reflexed)     Status: Abnormal   Collection Time: 05/29/19  4:15 AM  Result Value Ref Range Status   Enterococcus species NOT DETECTED NOT DETECTED Final   Listeria monocytogenes NOT DETECTED NOT DETECTED Final   Staphylococcus species DETECTED (A) NOT DETECTED Final    Comment: CRITICAL RESULT CALLED TO, READ BACK BY AND VERIFIED WITH: J MILLEN PHARMD 05/29/19 2107 JDW    Staphylococcus aureus (BCID) DETECTED (A) NOT DETECTED Final    Comment: Methicillin (oxacillin) susceptible Staphylococcus aureus (MSSA). Preferred therapy is anti staphylococcal beta lactam antibiotic (Cefazolin or Nafcillin), unless clinically contraindicated. CRITICAL RESULT CALLED TO, READ BACK BY AND VERIFIED WITH: Dion Body [facility] 05/29/19 2107 JDW    Methicillin resistance NOT DETECTED NOT DETECTED Final   Streptococcus species NOT DETECTED NOT DETECTED Final   Streptococcus agalactiae NOT DETECTED NOT DETECTED Final   Streptococcus pneumoniae NOT DETECTED NOT DETECTED Final   Streptococcus pyogenes NOT DETECTED NOT DETECTED Final   Acinetobacter baumannii NOT DETECTED NOT DETECTED Final   Enterobacteriaceae species NOT DETECTED NOT DETECTED Final   Enterobacter cloacae complex NOT DETECTED NOT DETECTED Final   Escherichia coli NOT DETECTED NOT DETECTED Final   Klebsiella oxytoca NOT DETECTED NOT DETECTED Final   Klebsiella pneumoniae NOT DETECTED NOT DETECTED Final   Proteus species NOT DETECTED NOT DETECTED Final   Serratia marcescens  NOT DETECTED NOT DETECTED Final   Haemophilus influenzae NOT DETECTED NOT DETECTED Final   Neisseria meningitidis NOT DETECTED NOT DETECTED Final   Pseudomonas aeruginosa  NOT DETECTED NOT DETECTED Final   Candida albicans NOT DETECTED NOT DETECTED Final   Candida glabrata NOT DETECTED NOT DETECTED Final   Candida krusei NOT DETECTED NOT DETECTED Final   Candida parapsilosis NOT DETECTED NOT DETECTED Final   Candida tropicalis NOT DETECTED NOT DETECTED Final    Comment: Performed at [facility], [place] [address] [place], [place] [address]  MRSA PCR Screening     Status: Abnormal   Collection Time: 05/29/19  4:17 AM   Specimen: Nasal Mucosa; Nasopharyngeal  Result Value Ref Range Status   MRSA by PCR POSITIVE (A) NEGATIVE Final    Comment:        The GeneXpert MRSA Assay (FDA approved for NASAL specimens only), is one component of a comprehensive MRSA colonization surveillance program. It is not intended to diagnose MRSA infection nor to guide or monitor treatment for MRSA infections. RESULT CALLED TO, READ BACK BY AND VERIFIED WITH: PHILLIPS,T RN [phone] 05/29/2019 MITCHELL,L Performed at [facility], [place] [address] [place], [place] [address]   Blood culture (routine x 2)     Status: Abnormal   Collection Time: 05/29/19  4:23 AM   Specimen: BLOOD  Result Value Ref Range Status   Specimen Description BLOOD LEFT ANTECUBITAL  Final   Special Requests   Final    BOTTLES DRAWN AEROBIC AND ANAEROBIC Blood Culture results may not be optimal due to an inadequate volume of blood received in culture bottles   Culture  Setup Time   Final    GRAM POSITIVE COCCI CRITICAL VALUE NOTED.  VALUE IS CONSISTENT WITH PREVIOUSLY REPORTED AND CALLED VALUE. IN BOTH AEROBIC AND ANAEROBIC BOTTLES    Culture (A)  Final    STAPHYLOCOCCUS AUREUS SUSCEPTIBILITIES PERFORMED ON PREVIOUS CULTURE WITHIN THE LAST 5 DAYS. Performed at [facility], [place] [name], [place], [place] [address]     Report Status 05/31/2019 FINAL  Final  Culture, blood (routine x 2)     Status: None (Preliminary result)   Collection Time: 05/31/19  4:49 PM   Specimen: BLOOD RIGHT HAND  Result Value Ref Range Status   Specimen Description BLOOD RIGHT HAND  Final   Special Requests   Final    BOTTLES DRAWN AEROBIC AND ANAEROBIC Blood Culture adequate volume   Culture   Final    NO GROWTH 3 DAYS Performed at [facility], [place] [address] [place], [place] [address]    Report Status PENDING  Incomplete  Culture, blood (routine x 2)     Status: None (Preliminary result)   Collection Time: 05/31/19  7:49 PM   Specimen: BLOOD  Result Value Ref Range Status   Specimen Description BLOOD SITE NOT SPECIFIED  Final   Special Requests   Final    BOTTLES DRAWN AEROBIC ONLY Blood Culture results may not be optimal due to an inadequate volume of blood received in culture bottles   Culture   Final    NO GROWTH 3 DAYS Performed at [facility], [place] [address], [place], [place] [address]    Report Status PENDING  Incomplete    Coagulation Studies: No results for input(s): LABPROT, INR in the last 72 hours.  Urinalysis: No results for input(s): COLORURINE, LABSPEC, PHURINE, GLUCOSEU, HGBUR, BILIRUBINUR, KETONESUR, PROTEINUR, UROBILINOGEN, NITRITE, LEUKOCYTESUR in the last 72 hours.  Invalid input(s): APPERANCEUR    Imaging: Dg Chest Port 1 View  Result Date: 06/03/2019 CLINICAL DATA:  Revision or replacement of LEFT  internal jugular dialysis catheter. EXAM: PORTABLE CHEST 1 VIEW COMPARISON:  05/29/2019 and earlier. FINDINGS: LEFT jugular dialysis catheter tips project over the LOWER SVC at or near the cavoatrial junction. No evidence of pneumothorax or mediastinal hematoma. Cardiac silhouette moderately enlarged, unchanged. Pulmonary venous hypertension with minimal/incipient interstitial pulmonary edema. Lungs otherwise clear. IMPRESSION: 1. LEFT jugular dialysis catheter tips project over the  LOWER SVC at or near the cavoatrial junction. No acute complicating features. 2. Stable cardiomegaly. Minimal/incipient interstitial pulmonary edema. Electronically Signed   By: Evangeline Dakin M.D.   On: 06/03/2019 15:28   Dg Fluoro Guide Cv Line-no Report  Result Date: 06/03/2019 Fluoroscopy was utilized by the requesting physician.  No radiographic interpretation.     Medications:   . sodium chloride    . sodium chloride 10 mL/hr at 06/04/19 0037  .  ceFAZolin (ANCEF) IV 1 g (06/04/19 0037)   . albuterol  10 mg Nebulization Once  . aspirin EC  81 mg Oral Daily  . atorvastatin  40 mg Oral Daily  . chlorhexidine  15 mL Mouth Rinse BID  . Chlorhexidine Gluconate Cloth  6 each Topical Q0600  . doxercalciferol  1 mcg Oral Q M,W,F-HD  . ferric citrate  630 mg Oral TID WC  . heparin  5,000 Units Subcutaneous Q8H  . insulin aspart  0-20 Units Subcutaneous TID WC  . insulin aspart  0-5 Units Subcutaneous QHS  . lidocaine  10 mL Intradermal Once  . mouth rinse  15 mL Mouth Rinse BID  . 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  Appears to have some mild hyperphosphatemia.  Recommend continued dietary and binder compliance education.  HTN/VOL-appears to be stable at this time no acute issues  ACCESS-dialysis catheter 06/03/2019 prior to dialysis.  Line holiday for MSSA bacteremia over the weekend.  Now with new tunneled dialysis catheter.  MSSA  bacteremia Ancef per primary team vascular surgery moved tunneled cath 05/31/2019 for line holiday repeat blood cultures no growth to date.  TEE negative for endocarditis.  Recommend completion of 4 weeks Ancef in total with surveillance of cultures 2 weeks after completion of therapy.  Diabetes per primary team  Altered mental status status post head CT no intracranial process noted   Patient appears stable from a renal standpoint for discharge   LOS: [place] @TODAY @8 :01 AM
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
## 2019-06-04 NOTE — Progress Notes (Addendum)
3:30pm- Per the [place], [name], patient's family picked up his new auto-pap machine on 8/21. They are requesting a stable ABG (CO2 over 70) in order to be able to adjust his settings. MD has ordered.   2pm-CSW awaiting call back from the [place] regarding ordering a Bipap for the patient.   Seth Locus Xzaviar Maloof LCSW [phone]

## 2019-06-04 NOTE — Telephone Encounter (Signed)
HFU per Dr Chancy Hurter; pt appt 09/01 1045am/NW

## 2019-06-04 NOTE — Plan of Care (Signed)
  Problem: Activity: Goal: Ability to tolerate increased activity will improve Outcome: Progressing   Problem: Respiratory: Goal: Ability to maintain a clear airway and adequate ventilation will improve Outcome: Progressing   Problem: Role Relationship: Goal: Method of communication will improve Outcome: Progressing   Problem: Education: Goal: Ability to describe self-care measures that may prevent or decrease complications (Diabetes Survival Skills Education) will improve Outcome: Progressing   Problem: Coping: Goal: Ability to adjust to condition or change in health will improve Outcome: Progressing   Problem: Health Behavior/Discharge Planning: Goal: Ability to manage health-related needs will improve Outcome: Progressing   Problem: Education: Goal: Knowledge of General Education information will improve Description: Including pain rating scale, medication(s)/side effects and non-pharmacologic comfort measures Outcome: Progressing   Problem: Health Behavior/Discharge Planning: Goal: Ability to manage health-related needs will improve Outcome: Progressing   Problem: Clinical Measurements: Goal: Ability to maintain clinical measurements within normal limits will improve Outcome: Progressing Goal: Will remain free from infection Outcome: Progressing Goal: Diagnostic test results will improve Outcome: Progressing Goal: Respiratory complications will improve Outcome: Progressing Goal: Cardiovascular complication will be avoided Outcome: Progressing   Problem: Activity: Goal: Risk for activity intolerance will decrease Outcome: Progressing   Problem: Nutrition: Goal: Adequate nutrition will be maintained Outcome: Progressing   Problem: Coping: Goal: Level of anxiety will decrease Outcome: Progressing   Problem: Elimination: Goal: Will not experience complications related to bowel motility Outcome: Progressing Goal: Will not experience complications related to  urinary retention Outcome: Progressing   Problem: Pain Managment: Goal: General experience of comfort will improve Outcome: Progressing   Problem: Safety: Goal: Ability to remain free from injury will improve Outcome: Progressing   Problem: Skin Integrity: Goal: Risk for impaired skin integrity will decrease Outcome: Progressing

## 2019-06-04 NOTE — Progress Notes (Signed)
[facility]  Subjective  - No complaints   Objective 113/88 88 97.6 F (36.4 C) (Oral) (!) 24 93%  Intake/Output Summary (Last 24 hours) at 06/04/2019 0959 Last data filed at 06/04/2019 0302 Gross per 24 hour  Intake 602.89 ml  Output 3050 ml  Net -2447.11 ml    LIJ tunneled catheter - no bleeding or hematoma Left arm basilic fistula with good thrill  Laboratory Lab Results: Recent Labs    06/03/19 1840  WBC 10.8*  HGB 11.0*  HCT 37.1*  PLT 224   BMET Recent Labs    06/03/19 1840 06/04/19 0420  NA 134* 132*  K 4.5 4.0  CL 96* 94*  CO2 21* 25  GLUCOSE 178* 216*  BUN 96* 55*  CREATININE 11.97* 9.10*  CALCIUM 8.0* 8.1*    COAG Lab Results  Component Value Date   INR 1.02 11/06/2017   INR 1.32 07/14/2015   INR 1.25 07/12/2015   No results found for: PTT  Assessment/Planning: POD #1 s/p replacement of LIJ tunneled catheter.  This was very difficult as dictated in op note.  Worked well for dialysis last night and looks good today.  Will get fistula duplex of left arm fistula to see if anything else needs to be done given report fistula difficult to access.  Marty Heck 06/04/2019 9:59 AM --

## 2019-06-04 NOTE — Progress Notes (Signed)
Left upper ext AVF duplex study  has been completed. Refer to [facility] under chart review to view preliminary results.   06/04/2019  2:26 PM Seth [name], Bonnye Fava

## 2019-06-04 NOTE — Consult Note (Signed)
   [facility] CM Inpatient Consult   06/04/2019  ARAVIND TAIWO March 25, 1966 [id number]    Patientreviewed for33% extreme high risk score for unplanned readmissions and hospitalizations; and to check for potential need of [facility] care management services as benefit from his Medicare/ NextGen plan.  Per review of patient's medical record and MD note dated 06/01/19, reveal as follows: 61 you M with Hx of OSA, OHS, ESRD on HD, and DM (recent A1C is 9.1). who presented with Acute Encephalopathy 2/2 Hypercarbia in the setting of Acute on Chronic Hypercarbic Respiratory Failure caused by broken home CPAP and MSSA Bacteremia presumably secondary to line infection.  (Acute on chronic hypercarbic respiratory failure, Obesity Hypoventilation Syndrome [needing BiPAP], MSSA bacteremia, ESRD- s/p replacement of LIJ tunneled dialysis catheter)   Patient's primary care provider isDr.Rylee Christian with [facility]. He also mentioned using to [place], [place] and [place] for health needs and follow-up.  Review ofPT notes show that patient will be transitioning to home with home health services.  Called and spoke with patient over the hospital phone with the help of patient's RN. HIPAA verified. Patient reports that he is going home today, just waiting for "Bipap machine to help with breathing". He states that sister Jonelle Sidle), his 2 sons and a daughter lives at home with him. He denies any needs regarding medications (self managed); pharmacy (using [facility] at [place]); transportation provided by his sister, son or himself when able; his family provides assistance when needed.  Explained to patient regarding [facility] care management services available for managing/ maintaining health and preventing readmission. Verbal consent provided by patientfor referral to [facility] RN care management coordinator for complex care and disease management (DM) and to assess  further needs after discharge. Patientverified his address and contact numberas listed in Epic.  Outreached Inpatient TOC SW and no further patient needs known except for waiting on [facility] to get BiPaP set-up.Made her aware that [facility] CM will be following patient post discharge.  Of note, [facility] Care Management services does not replace or interfere with any services that are arranged by inpatient transition of care case management or social work.   For questionsand additional information,please contact:  Thyra Yinger A. Dewaun Kinzler, BSN, RN-BC [facility] Liaison Cell: [phone]

## 2019-06-04 NOTE — Progress Notes (Signed)
   Subjective: Seth [name] was seen at bedside this morning. He states he is ready to go and tolerated his procedures well. He has no complaints at this time. All questions and concerns were addressed.   Objective: Vital signs in last 24 hours: Vitals:   06/04/19 0527 06/04/19 0815 06/04/19 1303 06/04/19 1303  BP: 113/88  (!) 152/99   Pulse: 86 88 77 78  Resp: (!) 21 (!) 24 20   Temp: 97.6 F (36.4 C)  98.3 F (36.8 C)   TempSrc: Oral     SpO2: 92% 93% (!) 88% 91%  Weight:      Height:       Physical Exam Vitals signs and nursing note reviewed.  Constitutional:      General: He is not in acute distress.    Appearance: He is obese. He is not ill-appearing, toxic-appearing or diaphoretic.  HENT:     Head: Normocephalic and atraumatic.  Cardiovascular:     Rate and Rhythm: Normal rate and regular rhythm.     Pulses: Normal pulses.     Heart sounds: Normal heart sounds. No murmur.  Pulmonary:     Effort: Pulmonary effort is normal. No respiratory distress.     Breath sounds: Normal breath sounds. No stridor. No wheezing or rhonchi.  Neurological:     Mental Status: He is alert.     Assessment/Plan:  MSSA Bacteremia: - Continuing IV Ancef.  - TEE:  LVEF 60-65% - Tunneled HD catheter placed 06/03/2019. - Repeat blood cultures from 8/21: NGTD day 3. - Appreciate coordinated care with ID, vascular, and nephrology.  ESRD on HD: - Last HD session on 8/24 - Appreciate Nephrology Recommendations - Doxercalciferol 1mg  MWF - Ferric Citrate 630mg  TID AC and 430mg  BID (between meals)   Encephalopathy 2/2 Acute on Chronic Hypercarbic Respiratory Failure Chronic Hypercarbic Respiratory Failure 2/2 OSA and OHS: - Continue BiPAP qhs  Diabetes: - CBGs at goal  - Continue SSI  HTN: - Continue to hold Amlodipine  Chronic Back Pain:  - Continue home dose of oxycodone 10-325 mg PRN.   FEN: Renal Diet VTE ppx: Heparin Code Status: FULL   Dispo: Anticipated discharge  today.   Seth Mercury, MD 06/04/2019, 1:22 PM

## 2019-06-05 LAB — RENAL FUNCTION PANEL
Albumin: 3.1 g/dL — ABNORMAL LOW (ref 3.5–5.0)
Anion gap: 15 (ref 5–15)
BUN: 84 mg/dL — ABNORMAL HIGH (ref 6–20)
CO2: 22 mmol/L (ref 22–32)
Calcium: 8.8 mg/dL — ABNORMAL LOW (ref 8.9–10.3)
Chloride: 94 mmol/L — ABNORMAL LOW (ref 98–111)
Creatinine, Ser: 11.96 mg/dL — ABNORMAL HIGH (ref 0.61–1.24)
GFR calc Af Amer: 5 mL/min — ABNORMAL LOW (ref >60)
GFR calc non Af Amer: 4 mL/min — ABNORMAL LOW (ref >60)
Glucose, Bld: 107 mg/dL — ABNORMAL HIGH (ref 70–99)
Phosphorus: 5.9 mg/dL — ABNORMAL HIGH (ref 2.5–4.6)
Potassium: 4.4 mmol/L (ref 3.5–5.1)
Sodium: 131 mmol/L — ABNORMAL LOW (ref 135–145)

## 2019-06-05 LAB — CULTURE, BLOOD (ROUTINE X 2)
Culture: NO GROWTH
Culture: NO GROWTH
Special Requests: ADEQUATE

## 2019-06-05 LAB — GLUCOSE, CAPILLARY
Glucose-Capillary: 243 mg/dL — ABNORMAL HIGH (ref 70–99)
Glucose-Capillary: 215 mg/dL — ABNORMAL HIGH (ref 70–99)
Glucose-Capillary: 202 mg/dL — ABNORMAL HIGH (ref 70–99)

## 2019-06-05 MED ORDER — ALTEPLASE 2 MG IJ SOLR
INTRAMUSCULAR | Status: AC
  Filled 2019-06-05: qty 4

## 2019-06-05 MED ORDER — ALTEPLASE 2 MG IJ SOLR: 4.0000 mg | Freq: Once | INTRAMUSCULAR | Status: AC

## 2019-06-05 MED ORDER — CHLORHEXIDINE GLUCONATE CLOTH 2 % EX PADS
6.0000 | MEDICATED_PAD | Freq: Every day | CUTANEOUS | Status: DC
  Administered 2019-06-06: 06:00:00 6 via TOPICAL

## 2019-06-05 NOTE — Progress Notes (Signed)
Paged family medicine.   Pt will get labs drawn when he goes to dialysis after 15:00 today.  We have been unable to obtain ABG after several sticks. Pt is refusing to be stuck again.  ABG results are needed for home BIPAP.  Paged md to find out what they want to do about this.  Pt refuses to go home without bipap

## 2019-06-05 NOTE — Care Management Important Message (Signed)
Important Message  Patient Details  Name: Seth [name] MRN: [id number] Date of Birth: July 05, 1966   Medicare Important Message Given:  Yes     Memory Argue 06/05/2019, 3:50 PM

## 2019-06-05 NOTE — Progress Notes (Signed)
Pt refused to let me attempt to stick for ABG that was ordered by MD for pt to get BIPAP for home.  Pt understands he needs this but he was stuck yesterday 4 times and they were unsuccessful and says he can't go through that again.  Nurse made aware and contacting MD.  RT will continue to monitor.

## 2019-06-05 NOTE — Progress Notes (Signed)
MD on call notified that 3 RT attempted to get ABG for Bipap and was unsuccessful. Arthor Captain LPN

## 2019-06-05 NOTE — Progress Notes (Signed)
RT attempted to ABG on patient, unable to obtain sample to analysis. Informed RN, will continue to monitor pt.
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
## 2019-06-05 NOTE — Progress Notes (Signed)
Paged and spoke with Dr. Arliss Journey, report pt was not able to do dialysis because tunneled cath is not working.  They were able to draw some blood off for labs, but were unable to obtain ABG.  Pt refuses ABG with RT.  Pt requests to speak to md about ABG.  Asked if MD could do femoral arterial stick for ABG.  Md said she would get back to me on these issues.

## 2019-06-05 NOTE — Progress Notes (Signed)
   Subjective: Seth [name] was seen at bedside this morning. He states that he is doing well today. He states that he is going to be getting dialysis today and get his BiPAP machine approved before being discharged. All questions and concerns were addressed.   Objective: Vital signs in last 24 hours: Vitals:   06/04/19 2322 06/05/19 0205 06/05/19 0300 06/05/19 0659  BP: (!) 149/103   (!) 172/107  Pulse: 91 81  78  Resp: 18 (!) 32 20 (!) 21  Temp: 98 F (36.7 C)   98.1 F (36.7 C)  TempSrc:      SpO2: 92% 100%  95%  Weight:      Height:       Physical Exam Vitals signs and nursing note reviewed.  Constitutional:      General: He is not in acute distress.    Appearance: He is obese. He is not ill-appearing, toxic-appearing or diaphoretic.  HENT:     Head: Normocephalic and atraumatic.  Cardiovascular:     Rate and Rhythm: Normal rate and regular rhythm.     Pulses: Normal pulses.     Heart sounds: Normal heart sounds. No murmur.  Pulmonary:     Effort: Pulmonary effort is normal. No respiratory distress.     Breath sounds: Normal breath sounds. No stridor. No wheezing or rhonchi.  Neurological:     Mental Status: He is alert.     Assessment/Plan:  MSSA Bacteremia: - Continuing IV Ancef.  - TEE:  LVEF 60-65% - Tunneled HD catheter placed 06/03/2019. - Repeat blood cultures from 8/21: NGTD. - Appreciate coordinated care with ID, vascular, and nephrology.  ESRD on HD: - Last HD session scheduled for 8/26 - Appreciate Nephrology Recommendations - Doxercalciferol 1mg  MWF - Ferric Citrate 630mg  TID AC and 430mg  BID (between meals)   Encephalopathy 2/2 Acute on Chronic Hypercarbic Respiratory Failure Chronic Hypercarbic Respiratory Failure 2/2 OSA and OHS: - Continue BiPAP qhs  Diabetes: - CBGs at goal  - Continue SSI  HTN: - Continue to hold Amlodipine  Chronic Back Pain:  - Continue home dose of oxycodone 10-325 mg PRN.   FEN: Renal Diet VTE ppx: Heparin  Code Status: FULL   Dispo: Anticipated discharge today.   Maudie Mercury, MD 06/05/2019, 11:35 AM

## 2019-06-05 NOTE — Progress Notes (Signed)
   Patient has poorly working Fistula Duplex was ordered yesterday:  +------------+----------+-------------+----------+----------------+ OUTFLOW VEINPSV (cm/s)Diameter (cm)Depth (cm)    Describe     +------------+----------+-------------+----------+----------------+ Prox UA        148        0.97        0.81   Fluid collection +------------+----------+-------------+----------+----------------+ Mid UA         184        0.72        0.64                    +------------+----------+-------------+----------+----------------+ Dist UA        283        0.60        0.37                    +------------+----------+-------------+----------+----------------+  DR. Donzetta Matters had performed second stage basilic transposition and stated if this does not work he may have to undergo Left AV graft.  I will discuss this possibility with Dr. Carlis Abbott.  Cont. To use TDC until further plans.   Roxy Horseman PA-C

## 2019-06-05 NOTE — Progress Notes (Signed)
Pt HD catheter is not working; could not run above 186ml/min; called Dr. Edrick Oh and he ordered cathflo and run the patient first thing tomorrow morning. Sinclair Ship, RN called and made aware.

## 2019-06-05 NOTE — Progress Notes (Signed)
[facility] ROUNDING NOTE   Subjective:   This is a 53 year old gentleman end-stage renal disease hemodialysis [facility] Monday Wednesday Friday sleep apnea presented to the emergency room with the altered mental status.  He was diagnosed with MSSA bacteremia.  TEE 8/24 was negative for endocarditis.  Blood cultures no growth to date.  Infectious disease recommending surveillance cultures.  Appreciate assistance from Mauricio Po with the infectious disease team.  Line holiday over the weekend.  Replaced HD catheter tunneled 06/03/2019.  Underwent successful dialysis 06/03/2019 with removal of 3 L.  He is scheduled for dialysis 06/05/2019  Blood pressure 172/107 pulse 78 temperature 98.1 O2 sats 95%  Sodium 132 potassium 4.0 chloride 94 CO2 25 BUN 35 creatinine 9.1 glucose 216 calcium 8.1 phosphorus 5.9 albumin 3.1 WBC 10.8 hemoglobin 11.0 platelets 225  Aspirin 81 mg Lipitor 40 mg daily, Hectorol 1 mcg Monday Wednesday Friday, Auryxia 630 mg 3 times daily with meals.  Ancef 1 g daily.  Insulin sliding scale  Objective:  Vital signs in last 24 hours:  Temp:  [98 F (36.7 C)-98.3 F (36.8 C)] 98.1 F (36.7 C) (08/26 0659) Pulse Rate:  [77-91] 78 (08/26 0659) Resp:  [18-32] 21 (08/26 0659) BP: (149-172)/(99-107) 172/107 (08/26 0659) SpO2:  [88 %-100 %] 95 % (08/26 0659)  Weight change:  Filed Weights   06/03/19 1815 06/03/19 2225 06/04/19 0500  Weight: (!) 144 kg (!) 141 kg (!) 140.9 kg    Intake/Output: I/O last 3 completed shifts: In: 802.9 [P.O.:600; I.V.:102.9; IV Piggyback:100] Out: 3000 [Other:3000]   Intake/Output this shift:  No intake/output data recorded.  General adult male in bed in no acute distress HEENT normocephalic atraumatic extraocular movements intact sclera anicteric Neck increased neck circumference; trachea midline Lungs clear and unlabored  Heart RRR; no rub Abdomen soft nontender obese habitus  Extremities no to trace lower  extremity edema  Psych normal mood and affect Neuro - alert and oriented x 3 Access: LUE AVF with bruit and thrill     Basic Metabolic Panel: Recent Labs  Lab 05/31/19 0700 06/01/19 0813 06/02/19 1209 06/03/19 1840 06/04/19 0420  NA 131* 133* 132* 134* 132*  K 5.0 4.4 4.7 4.5 4.0  CL 93* 97* 92* 96* 94*  CO2 19* 17* 22 21* 25  GLUCOSE 191* 170* 162* 178* 216*  BUN 73* 49* 80* 96* 55*  CREATININE 11.64* 8.56* 10.64* 11.97* 9.10*  CALCIUM 7.2* 7.6* 8.0* 8.0* 8.1*  PHOS 9.1* 6.4* 5.0* 6.1* 5.9*    Liver Function Tests: Recent Labs  Lab 05/31/19 0700 06/01/19 0813 06/02/19 1209 06/03/19 1840 06/04/19 0420  ALBUMIN 2.7* 3.0* 3.3* 2.9* 3.1*   No results for input(s): LIPASE, AMYLASE in the last 168 hours. No results for input(s): AMMONIA in the last 168 hours.  CBC: Recent Labs  Lab 05/31/19 0700 06/03/19 1840  WBC 11.0* 10.8*  HGB 12.0* 11.0*  HCT 38.4* 37.1*  MCV 97.0 99.2  PLT 191 224    Cardiac Enzymes: No results for input(s): CKTOTAL, CKMB, CKMBINDEX, TROPONINI in the last 168 hours.  BNP: Invalid input(s): POCBNP  CBG: Recent Labs  Lab 06/03/19 2254 06/04/19 0808 06/04/19 1300 06/04/19 1822 06/04/19 2322  GLUCAP 148* 224* 194* 149* 154*    Microbiology: Results for orders placed or performed during the hospital encounter of 05/28/19  SARS Coronavirus 2 [facility] order, Performed in [facility] hospital lab) Nasopharyngeal Nasopharyngeal Swab     Status: None   Collection Time: 05/28/19  9:14 PM  Specimen: Nasopharyngeal Swab  Result Value Ref Range Status   SARS Coronavirus 2 NEGATIVE NEGATIVE Final    Comment: (NOTE) If result is NEGATIVE SARS-CoV-2 target nucleic acids are NOT DETECTED. The SARS-CoV-2 RNA is generally detectable in upper and lower  respiratory specimens during the acute phase of infection. The lowest  concentration of SARS-CoV-2 viral copies this assay can detect is 250  copies / mL. A negative result does not  preclude SARS-CoV-2 infection  and should not be used as the sole basis for treatment or other  patient management decisions.  A negative result may occur with  improper specimen collection / handling, submission of specimen other  than nasopharyngeal swab, presence of viral mutation(s) within the  areas targeted by this assay, and inadequate number of viral copies  (<250 copies / mL). A negative result must be combined with clinical  observations, patient history, and epidemiological information. If result is POSITIVE SARS-CoV-2 target nucleic acids are DETECTED. The SARS-CoV-2 RNA is generally detectable in upper and lower  respiratory specimens dur ing the acute phase of infection.  Positive  results are indicative of active infection with SARS-CoV-2.  Clinical  correlation with patient history and other diagnostic information is  necessary to determine patient infection status.  Positive results do  not rule out bacterial infection or co-infection with other viruses. If result is PRESUMPTIVE POSTIVE SARS-CoV-2 nucleic acids MAY BE PRESENT.   A presumptive positive result was obtained on the submitted specimen  and confirmed on repeat testing.  While 2019 novel coronavirus  (SARS-CoV-2) nucleic acids may be present in the submitted sample  additional confirmatory testing may be necessary for epidemiological  and / or clinical management purposes  to differentiate between  SARS-CoV-2 and other Sarbecovirus currently known to infect humans.  If clinically indicated additional testing with an alternate test  methodology [id number]) is advised. The SARS-CoV-2 RNA is generally  detectable in upper and lower respiratory sp ecimens during the acute  phase of infection. The expected result is Negative. Fact Sheet for Patients:  StrictlyIdeas.no Fact Sheet for Healthcare Providers: BankingDealers.co.za This test is not yet approved or cleared by  the [place] FDA and has been authorized for detection and/or diagnosis of SARS-CoV-2 by FDA under an Emergency Use Authorization (EUA).  This EUA will remain in effect (meaning this test can be used) for the duration of the COVID-19 declaration under Section 564(b)(1) of the Act, 21 U.S.C. section 360bbb-3(b)(1), unless the authorization is terminated or revoked sooner. Performed at [facility], [place] [address] [place], [place] [address]   Blood culture (routine x 2)     Status: Abnormal   Collection Time: 05/29/19  4:15 AM   Specimen: BLOOD RIGHT HAND  Result Value Ref Range Status   Specimen Description BLOOD RIGHT HAND  Final   Special Requests   Final    BOTTLES DRAWN AEROBIC ONLY Blood Culture results may not be optimal due to an inadequate volume of blood received in culture bottles   Culture  Setup Time   Final    AEROBIC BOTTLE ONLY GRAM POSITIVE COCCI CRITICAL RESULT CALLED TO, READ BACK BY AND VERIFIED WITHDion Body [facility] 05/29/19 2107 JDW Performed at [facility], [place] [address] [place],  [address]    Culture STAPHYLOCOCCUS AUREUS (A)  Final   Report Status 05/31/2019 FINAL  Final   Organism ID, Bacteria STAPHYLOCOCCUS AUREUS  Final      Susceptibility   Staphylococcus aureus -  MIC*    CIPROFLOXACIN <=0.5 SENSITIVE Sensitive     ERYTHROMYCIN <=0.25 SENSITIVE Sensitive     GENTAMICIN <=0.5 SENSITIVE Sensitive     OXACILLIN 0.5 SENSITIVE Sensitive     TETRACYCLINE <=1 SENSITIVE Sensitive     VANCOMYCIN <=0.5 SENSITIVE Sensitive     TRIMETH/SULFA <=10 SENSITIVE Sensitive     CLINDAMYCIN <=0.25 SENSITIVE Sensitive     RIFAMPIN <=0.5 SENSITIVE Sensitive     Inducible Clindamycin NEGATIVE Sensitive     * STAPHYLOCOCCUS AUREUS  Blood Culture ID Panel (Reflexed)     Status: Abnormal   Collection Time: 05/29/19  4:15 AM  Result Value Ref Range Status   Enterococcus species NOT DETECTED NOT DETECTED Final   Listeria monocytogenes NOT DETECTED  NOT DETECTED Final   Staphylococcus species DETECTED (A) NOT DETECTED Final    Comment: CRITICAL RESULT CALLED TO, READ BACK BY AND VERIFIED WITH: J MILLEN PHARMD 05/29/19 2107 JDW    Staphylococcus aureus (BCID) DETECTED (A) NOT DETECTED Final    Comment: Methicillin (oxacillin) susceptible Staphylococcus aureus (MSSA). Preferred therapy is anti staphylococcal beta lactam antibiotic (Cefazolin or Nafcillin), unless clinically contraindicated. CRITICAL RESULT CALLED TO, READ BACK BY AND VERIFIED WITH: Dion Body [facility] 05/29/19 2107 JDW    Methicillin resistance NOT DETECTED NOT DETECTED Final   Streptococcus species NOT DETECTED NOT DETECTED Final   Streptococcus agalactiae NOT DETECTED NOT DETECTED Final   Streptococcus pneumoniae NOT DETECTED NOT DETECTED Final   Streptococcus pyogenes NOT DETECTED NOT DETECTED Final   Acinetobacter baumannii NOT DETECTED NOT DETECTED Final   Enterobacteriaceae species NOT DETECTED NOT DETECTED Final   Enterobacter cloacae complex NOT DETECTED NOT DETECTED Final   Escherichia coli NOT DETECTED NOT DETECTED Final   Klebsiella oxytoca NOT DETECTED NOT DETECTED Final   Klebsiella pneumoniae NOT DETECTED NOT DETECTED Final   Proteus species NOT DETECTED NOT DETECTED Final   Serratia marcescens NOT DETECTED NOT DETECTED Final   Haemophilus influenzae NOT DETECTED NOT DETECTED Final   Neisseria meningitidis NOT DETECTED NOT DETECTED Final   Pseudomonas aeruginosa NOT DETECTED NOT DETECTED Final   Candida albicans NOT DETECTED NOT DETECTED Final   Candida glabrata NOT DETECTED NOT DETECTED Final   Candida krusei NOT DETECTED NOT DETECTED Final   Candida parapsilosis NOT DETECTED NOT DETECTED Final   Candida tropicalis NOT DETECTED NOT DETECTED Final    Comment: Performed at [facility], [place] [address] [place], [place] [address]  MRSA PCR Screening     Status: Abnormal   Collection Time: 05/29/19  4:17 AM   Specimen: Nasal Mucosa; Nasopharyngeal   Result Value Ref Range Status   MRSA by PCR POSITIVE (A) NEGATIVE Final    Comment:        The GeneXpert MRSA Assay (FDA approved for NASAL specimens only), is one component of a comprehensive MRSA colonization surveillance program. It is not intended to diagnose MRSA infection nor to guide or monitor treatment for MRSA infections. RESULT CALLED TO, READ BACK BY AND VERIFIED WITH: PHILLIPS,T RN [phone] 05/29/2019 MITCHELL,L Performed at [facility], [place] [address], [place], [place] [address]   Blood culture (routine x 2)     Status: Abnormal   Collection Time: 05/29/19  4:23 AM   Specimen: BLOOD  Result Value Ref Range Status   Specimen Description BLOOD LEFT ANTECUBITAL  Final   Special Requests   Final    BOTTLES DRAWN AEROBIC AND ANAEROBIC Blood Culture results may not be optimal due to an  inadequate volume of blood received in culture bottles   Culture  Setup Time   Final    GRAM POSITIVE COCCI CRITICAL VALUE NOTED.  VALUE IS CONSISTENT WITH PREVIOUSLY REPORTED AND CALLED VALUE. IN BOTH AEROBIC AND ANAEROBIC BOTTLES    Culture (A)  Final    STAPHYLOCOCCUS AUREUS SUSCEPTIBILITIES PERFORMED ON PREVIOUS CULTURE WITHIN THE LAST 5 DAYS. Performed at [facility], [place] [address] [place], [place] [address]    Report Status 05/31/2019 FINAL  Final  Culture, blood (routine x 2)     Status: None (Preliminary result)   Collection Time: 05/31/19  4:49 PM   Specimen: BLOOD RIGHT HAND  Result Value Ref Range Status   Specimen Description BLOOD RIGHT HAND  Final   Special Requests   Final    BOTTLES DRAWN AEROBIC AND ANAEROBIC Blood Culture adequate volume   Culture   Final    NO GROWTH 4 DAYS Performed at [facility], [place] [address], [place], [place] [address]    Report Status PENDING  Incomplete  Culture, blood (routine x 2)     Status: None (Preliminary result)   Collection Time: 05/31/19  7:49 PM   Specimen: BLOOD  Result Value Ref Range Status   Specimen  Description BLOOD SITE NOT SPECIFIED  Final   Special Requests   Final    BOTTLES DRAWN AEROBIC ONLY Blood Culture results may not be optimal due to an inadequate volume of blood received in culture bottles   Culture   Final    NO GROWTH 4 DAYS Performed at [facility], [place] [address], [place], [place] [address]    Report Status PENDING  Incomplete    Coagulation Studies: No results for input(s): LABPROT, INR in the last 72 hours.  Urinalysis: No results for input(s): COLORURINE, LABSPEC, PHURINE, GLUCOSEU, HGBUR, BILIRUBINUR, KETONESUR, PROTEINUR, UROBILINOGEN, NITRITE, LEUKOCYTESUR in the last 72 hours.  Invalid input(s): APPERANCEUR    Imaging: Dg Chest Port 1 View  Result Date: 06/03/2019 CLINICAL DATA:  Revision or replacement of LEFT internal jugular dialysis catheter. EXAM: PORTABLE CHEST 1 VIEW COMPARISON:  05/29/2019 and earlier. FINDINGS: LEFT jugular dialysis catheter tips project over the LOWER SVC at or near the cavoatrial junction. No evidence of pneumothorax or mediastinal hematoma. Cardiac silhouette moderately enlarged, unchanged. Pulmonary venous hypertension with minimal/incipient interstitial pulmonary edema. Lungs otherwise clear. IMPRESSION: 1. LEFT jugular dialysis catheter tips project over the LOWER SVC at or near the cavoatrial junction. No acute complicating features. 2. Stable cardiomegaly. Minimal/incipient interstitial pulmonary edema. Electronically Signed   By: Evangeline Dakin M.D.   On: 06/03/2019 15:28   Dg Fluoro Guide Cv Line-no Report  Result Date: 06/03/2019 Fluoroscopy was utilized by the requesting physician.  No radiographic interpretation.   Vas US Duplex Dialysis Access (avf, Avg)  Result Date: 06/04/2019 DIALYSIS ACCESS Reason for Exam: Difficulity cannulating left arm fistula. Access Type: Basilic vein transposition. Limitations: Body Habitus Comparison Study: Last study done on 05/20/19 Performing Technologist: Oda Cogan RDMS,  RVT Supporting Technologist: June Leap RDMS, RVT  Examination Guidelines: A complete evaluation includes B-mode imaging, spectral Doppler, color Doppler, and power Doppler as needed of all accessible portions of each vessel. Unilateral testing is considered an integral part of a complete examination. Limited examinations for reoccurring indications may be performed as noted.  Findings: +--------------------+----------+-----------------+--------+ AVF                 PSV (cm/s)Flow Vol (mL/min)Comments +--------------------+----------+-----------------+--------+ Native artery inflow  362          1068                +--------------------+----------+-----------------+--------+ AVF Anastomosis        697                              +--------------------+----------+-----------------+--------+  +------------+----------+-------------+----------+----------------+ OUTFLOW VEINPSV (cm/s)Diameter (cm)Depth (cm)    Describe     +------------+----------+-------------+----------+----------------+ Prox UA        148        0.97        0.81   Fluid collection +------------+----------+-------------+----------+----------------+ Mid UA         184        0.72        0.64                    +------------+----------+-------------+----------+----------------+ Dist UA        283        0.60        0.37                    +------------+----------+-------------+----------+----------------+   Summary: Patent arteriovenous fistula with small amount of fluid collection location in the proximal upper arm.  *See table(s) above for measurements and observations.  Diagnosing physician: Deitra Mayo MD Electronically signed by Deitra Mayo MD on 06/04/2019 at 2:49:10 PM.   --------------------------------------------------------------------------------   Final      Medications:   . sodium chloride    . sodium chloride 10 mL/hr at 06/04/19 0931  .  ceFAZolin (ANCEF) IV     . albuterol   10 mg Nebulization Once  . aspirin EC  81 mg Oral Daily  . atorvastatin  40 mg Oral Daily  . chlorhexidine  15 mL Mouth Rinse BID  . Chlorhexidine Gluconate Cloth  6 each Topical Q0600  . doxercalciferol  1 mcg Oral Q M,W,F-HD  . ferric citrate  630 mg Oral TID WC  . heparin  5,000 Units Subcutaneous Q8H  . insulin aspart  0-20 Units Subcutaneous TID WC  . insulin aspart  0-5 Units Subcutaneous QHS  . mouth rinse  15 mL Mouth Rinse BID   acetaminophen **OR** acetaminophen, alteplase, bisacodyl, docusate, ferric citrate, heparin, lidocaine, lidocaine (PF), lidocaine-prilocaine, oxyCODONE-acetaminophen **AND** oxyCODONE, pentafluoroprop-tetrafluoroeth, senna-docusate  Assessment/ Plan:   ESRD-Monday Wednesday Friday dialysis line holiday weekend replacing  06/03/2019 successful dialysis with removal of 3 L ultrafiltration 06/03/2019.  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  Appears to have some mild hyperphosphatemia.  Recommend continued dietary and binder compliance education.  HTN/VOL-appears to be stable at this time no acute issues.  His blood pressure slightly elevated predialysis we will continue to follow  ACCESS-dialysis catheter 06/03/2019 prior to dialysis.  Line holiday for MSSA bacteremia over the weekend.  Now with new tunneled dialysis catheter.  MSSA bacteremia Ancef per primary team vascular surgery moved tunneled cath 05/31/2019 for line holiday repeat blood cultures no growth to date.  TEE negative for endocarditis.  Recommend completion of 4 weeks Ancef in total with surveillance of cultures 2 weeks after completion of therapy.  Diabetes per primary team  Altered mental status status post head CT no intracranial process noted   Patient appears stable from a renal standpoint for discharge  LOS: [place] @TODAY @8 :19  AM

## 2019-06-06 LAB — RENAL FUNCTION PANEL
Albumin: 3.1 g/dL — ABNORMAL LOW (ref 3.5–5.0)
Anion gap: 18 — ABNORMAL HIGH (ref 5–15)
BUN: 88 mg/dL — ABNORMAL HIGH (ref 6–20)
CO2: 18 mmol/L — ABNORMAL LOW (ref 22–32)
Calcium: 8.8 mg/dL — ABNORMAL LOW (ref 8.9–10.3)
Chloride: 96 mmol/L — ABNORMAL LOW (ref 98–111)
Creatinine, Ser: 12.67 mg/dL — ABNORMAL HIGH (ref 0.61–1.24)
GFR calc Af Amer: 5 mL/min — ABNORMAL LOW (ref >60)
GFR calc non Af Amer: 4 mL/min — ABNORMAL LOW (ref >60)
Glucose, Bld: 147 mg/dL — ABNORMAL HIGH (ref 70–99)
Phosphorus: 6.5 mg/dL — ABNORMAL HIGH (ref 2.5–4.6)
Potassium: 4.6 mmol/L (ref 3.5–5.1)
Sodium: 132 mmol/L — ABNORMAL LOW (ref 135–145)

## 2019-06-06 LAB — BLOOD GAS, ARTERIAL
Acid-Base Excess: 0.6 mmol/L (ref 0.0–2.0)
Bicarbonate: 25.6 mmol/L (ref 20.0–28.0)
Drawn by: 33683
FIO2: 21
O2 Saturation: 90.9 %
Patient temperature: 98.6
pCO2 arterial: 48.4 mmHg — ABNORMAL HIGH (ref 32.0–48.0)
pH, Arterial: 7.343 — ABNORMAL LOW (ref 7.350–7.450)
pO2, Arterial: 64.9 mmHg — ABNORMAL LOW (ref 83.0–108.0)

## 2019-06-06 LAB — GLUCOSE, CAPILLARY
Glucose-Capillary: 153 mg/dL — ABNORMAL HIGH (ref 70–99)
Glucose-Capillary: 146 mg/dL — ABNORMAL HIGH (ref 70–99)
Glucose-Capillary: 221 mg/dL — ABNORMAL HIGH (ref 70–99)

## 2019-06-06 MED ORDER — LIDOCAINE-PRILOCAINE 2.5-2.5 % EX CREA
TOPICAL_CREAM | CUTANEOUS | Status: DC | PRN
  Filled 2019-06-06: qty 5

## 2019-06-06 MED ORDER — FERRIC CITRATE 1 GM 210 MG(FE) PO TABS
840.0000 mg | ORAL_TABLET | Freq: Three times a day (TID) | ORAL | Status: DC
  Administered 2019-06-06: 13:00:00 840 mg via ORAL
  Filled 2019-06-06: qty 4

## 2019-06-06 MED ORDER — HEPARIN SODIUM (PORCINE) 1000 UNIT/ML IJ SOLN
INTRAMUSCULAR | Status: AC
  Filled 2019-06-06: qty 4

## 2019-06-06 MED ORDER — CLOTRIMAZOLE 1 % EX CREA
TOPICAL_CREAM | Freq: Two times a day (BID) | CUTANEOUS | Status: DC
  Administered 2019-06-06 (×2): via TOPICAL
  Filled 2019-06-06: qty 15

## 2019-06-06 NOTE — Telephone Encounter (Signed)
Pt is currently in the hospital. 

## 2019-06-06 NOTE — Progress Notes (Signed)
   Subjective: Seth [name] was seen at bedside this afternoon. We discussed the requirements for him to receive his BiPAP machine at home. He was hesitant to get an ABG after having three sticks the previous day with no blood drawn. We spoke at length about ways to draw his blood. He was agreeable to local numbing before having his blood drawn. All questions and concerns were addressed.    Objective: Vital signs in last 24 hours: Vitals:   06/06/19 1000 06/06/19 1016 06/06/19 1105 06/06/19 1331  BP: 93/70 (!) 97/59 119/77 122/72  Pulse: 81 79 81 84  Resp:  18  20  Temp:  97.9 F (36.6 C)  97.7 F (36.5 C)  TempSrc:  Oral  Oral  SpO2:   94% 99%  Weight:  (!) 140.3 kg    Height:       Physical Exam Vitals signs and nursing note reviewed.  Constitutional:      General: He is not in acute distress.    Appearance: He is obese. He is not ill-appearing, toxic-appearing or diaphoretic.  HENT:     Head: Normocephalic and atraumatic.  Skin:    General: Skin is warm.  Neurological:     General: No focal deficit present.     Mental Status: He is alert and oriented to person, place, and time.     Assessment/Plan:  MSSA Bacteremia: - Continuing IV Ancef.  - TEE:  LVEF 60-65% - Tunneled HD catheter placed 06/03/2019. - Repeat blood cultures from 8/21: NGTD. - Appreciate coordinated care with ID, vascular, and nephrology.  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   FEN: Renal Diet VTE ppx: Heparin Code Status: FULL   Dispo: Anticipated  discharge today.   Maudie Mercury, MD 06/06/2019, 6:28 PM

## 2019-06-06 NOTE — Progress Notes (Signed)
Pt given discharge instructions, prescriptions, and care notes. Pt verbalized understanding AEB no further questions or concerns at this time. IV was discontinued, no redness, pain, or swelling noted at this time. Pt left the floor via wheelchair with staff in stable condition. 

## 2019-06-06 NOTE — TOC Progression Note (Signed)
Transition of Care [facility]) - Progression Note    Patient Details  Name: Seth [name] MRN: [id number] Date of Birth: August 29, 1966  [facility]) CM/SW [place], LCSW Phone Number: 06/06/2019, 5:31 PM  Clinical Narrative:    CSW faxed ABG results to [place] oxygen department. Awaiting further instructions.    Expected Discharge Plan: [place] Barriers to Discharge: Equipment Delay  Expected Discharge Plan and Services Expected Discharge Plan: [place] In-house Referral: NA Discharge Planning Services: CM Consult Post Acute Care Choice: Durable Medical Equipment, Home Health Living arrangements for the past 2 months: Single Family Home Expected Discharge Date: 06/04/19                         HH Arranged: RN, PT [place] Agency: [facility] [employer]) Date [place]: 06/05/19 Time [place]: [place] Representative spoke with at [place]: [name] ([place]) Interventions    Readmission Risk Interventions No flowsheet data found.

## 2019-06-06 NOTE — Progress Notes (Signed)
[facility] ROUNDING NOTE   Subjective:   This is a 53 year old gentleman end-stage renal disease hemodialysis [facility] Monday Wednesday Friday sleep apnea presented to the emergency room with the altered mental status.  He was diagnosed with MSSA bacteremia.  TEE 8/24 was negative for endocarditis.  Blood cultures no growth to date.  Infectious disease recommending surveillance cultures.  Appreciate assistance from Mauricio Po with the infectious disease team.  Line holiday over the weekend.  Replaced HD catheter tunneled 06/03/2019.  Underwent successful dialysis 06/03/2019 with removal of 3 L.  He is scheduled for dialysis 06/05/2019.  He was unable to have dialysis 06/05/2019 due to malfunction of catheter.  Administration of Cathflo to catheter overnight.  Patient now receiving his dialysis treatment 06/06/2019.  He understands on discharge he needs to present to his dialysis unit 06/07/2019.  Patient was seen during dialysis  Blood pressure 87/57 pulse 76 temperature 98 O2 sats 94% room air  Sodium 132 potassium 4.6 chloride 96 CO2 18 BUN 88 creatinine 12.7 glucose 147 calcium 8.8 phosphorus 6.5 albumin 3.1  Aspirin 81 mg Lipitor 40 mg daily, Hectorol 1 mcg Monday Wednesday Friday, Auryxia 630 mg 3 times daily with meals.  Ancef 1 g daily.  Insulin sliding scale  Objective:  Vital signs in last 24 hours:  Temp:  [97.6 F (36.4 C)-98 F (36.7 C)] 98 F (36.7 C) (08/27 0704) Pulse Rate:  [16-97] 95 (08/27 0900) Resp:  [14-99] 99 (08/27 0704) BP: (81-150)/(39-84) 87/57 (08/27 0900) SpO2:  [94 %-97 %] 94 % (08/27 0704) Weight:  [143.8 kg] 143.8 kg (08/27 0704)  Weight change:  Filed Weights   06/03/19 2225 06/04/19 0500 06/06/19 0704  Weight: (!) 141 kg (!) 140.9 kg (!) 143.8 kg    Intake/Output: I/O last 3 completed shifts: In: 360 [P.O.:360] Out: -    Intake/Output this shift:  No intake/output data recorded.  General adult male in bed in no acute  distress HEENT normocephalic atraumatic extraocular movements intact sclera anicteric Neck increased neck circumference; trachea midline Lungs clear and unlabored  Heart RRR; no rub Abdomen soft nontender obese habitus  Extremities no to trace lower extremity edema  Psych normal mood and affect Neuro - alert and oriented x 3 Access: LUE AVF with bruit and thrill     Basic Metabolic Panel: Recent Labs  Lab 06/02/19 1209 06/03/19 1840 06/04/19 0420 06/05/19 1700 06/06/19 0614  NA 132* 134* 132* 131* 132*  K 4.7 4.5 4.0 4.4 4.6  CL 92* 96* 94* 94* 96*  CO2 22 21* 25 22 18*  GLUCOSE 162* 178* 216* 107* 147*  BUN 80* 96* 55* 84* 88*  CREATININE 10.64* 11.97* 9.10* 11.96* 12.67*  CALCIUM 8.0* 8.0* 8.1* 8.8* 8.8*  PHOS 5.0* 6.1* 5.9* 5.9* 6.5*    Liver Function Tests: Recent Labs  Lab 06/02/19 1209 06/03/19 1840 06/04/19 0420 06/05/19 1700 06/06/19 0614  ALBUMIN 3.3* 2.9* 3.1* 3.1* 3.1*   No results for input(s): LIPASE, AMYLASE in the last 168 hours. No results for input(s): AMMONIA in the last 168 hours.  CBC: Recent Labs  Lab 05/31/19 0700 06/03/19 1840  WBC 11.0* 10.8*  HGB 12.0* 11.0*  HCT 38.4* 37.1*  MCV 97.0 99.2  PLT 191 224    Cardiac Enzymes: No results for input(s): CKTOTAL, CKMB, CKMBINDEX, TROPONINI in the last 168 hours.  BNP: Invalid input(s): POCBNP  CBG: Recent Labs  Lab 06/04/19 2322 06/05/19 0837 06/05/19 1224 06/05/19 2206 06/06/19 0641  GLUCAP 154* 243*  [place]    Microbiology: Results for orders placed or performed during the hospital encounter of 05/28/19  SARS Coronavirus 2 [facility] order, Performed in [facility] hospital lab) Nasopharyngeal Nasopharyngeal Swab     Status: None   Collection Time: 05/28/19  9:14 PM   Specimen: Nasopharyngeal Swab  Result Value Ref Range Status   SARS Coronavirus 2 NEGATIVE NEGATIVE Final    Comment: (NOTE) If result is NEGATIVE SARS-CoV-2 target nucleic acids are NOT  DETECTED. The SARS-CoV-2 RNA is generally detectable in upper and lower  respiratory specimens during the acute phase of infection. The lowest  concentration of SARS-CoV-2 viral copies this assay can detect is 250  copies / mL. A negative result does not preclude SARS-CoV-2 infection  and should not be used as the sole basis for treatment or other  patient management decisions.  A negative result may occur with  improper specimen collection / handling, submission of specimen other  than nasopharyngeal swab, presence of viral mutation(s) within the  areas targeted by this assay, and inadequate number of viral copies  (<250 copies / mL). A negative result must be combined with clinical  observations, patient history, and epidemiological information. If result is POSITIVE SARS-CoV-2 target nucleic acids are DETECTED. The SARS-CoV-2 RNA is generally detectable in upper and lower  respiratory specimens dur ing the acute phase of infection.  Positive  results are indicative of active infection with SARS-CoV-2.  Clinical  correlation with patient history and other diagnostic information is  necessary to determine patient infection status.  Positive results do  not rule out bacterial infection or co-infection with other viruses. If result is PRESUMPTIVE POSTIVE SARS-CoV-2 nucleic acids MAY BE PRESENT.   A presumptive positive result was obtained on the submitted specimen  and confirmed on repeat testing.  While 2019 novel coronavirus  (SARS-CoV-2) nucleic acids may be present in the submitted sample  additional confirmatory testing may be necessary for epidemiological  and / or clinical management purposes  to differentiate between  SARS-CoV-2 and other Sarbecovirus currently known to infect humans.  If clinically indicated additional testing with an alternate test  methodology [phone]) is advised. The SARS-CoV-2 RNA is generally  detectable in upper and lower respiratory sp ecimens during  the acute  phase of infection. The expected result is Negative. Fact Sheet for Patients:  StrictlyIdeas.no Fact Sheet for Healthcare Providers: BankingDealers.co.za This test is not yet approved or cleared by the [place] FDA and has been authorized for detection and/or diagnosis of SARS-CoV-2 by FDA under an Emergency Use Authorization (EUA).  This EUA will remain in effect (meaning this test can be used) for the duration of the COVID-19 declaration under Section 564(b)(1) of the Act, 21 U.S.C. section 360bbb-3(b)(1), unless the authorization is terminated or revoked sooner. Performed at [facility], [place] [address] [place], [place] [address]   Blood culture (routine x 2)     Status: Abnormal   Collection Time: 05/29/19  4:15 AM   Specimen: BLOOD RIGHT HAND  Result Value Ref Range Status   Specimen Description BLOOD RIGHT HAND  Final   Special Requests   Final    BOTTLES DRAWN AEROBIC ONLY Blood Culture results may not be optimal due to an inadequate volume of blood received in culture bottles   Culture  Setup Time   Final    AEROBIC BOTTLE ONLY GRAM POSITIVE COCCI CRITICAL RESULT CALLED TO, READ BACK BY AND VERIFIED WITH: Dion Body [facility] 05/29/19 2107 JDW  Performed at [facility], [place] [address], [place], [place] [address]    Culture STAPHYLOCOCCUS AUREUS (A)  Final   Report Status 05/31/2019 FINAL  Final   Organism ID, Bacteria STAPHYLOCOCCUS AUREUS  Final      Susceptibility   Staphylococcus aureus - MIC*    CIPROFLOXACIN <=0.5 SENSITIVE Sensitive     ERYTHROMYCIN <=0.25 SENSITIVE Sensitive     GENTAMICIN <=0.5 SENSITIVE Sensitive     OXACILLIN 0.5 SENSITIVE Sensitive     TETRACYCLINE <=1 SENSITIVE Sensitive     VANCOMYCIN <=0.5 SENSITIVE Sensitive     TRIMETH/SULFA <=10 SENSITIVE Sensitive     CLINDAMYCIN <=0.25 SENSITIVE Sensitive     RIFAMPIN <=0.5 SENSITIVE Sensitive     Inducible Clindamycin NEGATIVE  Sensitive     * STAPHYLOCOCCUS AUREUS  Blood Culture ID Panel (Reflexed)     Status: Abnormal   Collection Time: 05/29/19  4:15 AM  Result Value Ref Range Status   Enterococcus species NOT DETECTED NOT DETECTED Final   Listeria monocytogenes NOT DETECTED NOT DETECTED Final   Staphylococcus species DETECTED (A) NOT DETECTED Final    Comment: CRITICAL RESULT CALLED TO, READ BACK BY AND VERIFIED WITH: J MILLEN PHARMD 05/29/19 2107 JDW    Staphylococcus aureus (BCID) DETECTED (A) NOT DETECTED Final    Comment: Methicillin (oxacillin) susceptible Staphylococcus aureus (MSSA). Preferred therapy is anti staphylococcal beta lactam antibiotic (Cefazolin or Nafcillin), unless clinically contraindicated. CRITICAL RESULT CALLED TO, READ BACK BY AND VERIFIED WITH: Dion Body [facility] 05/29/19 2107 JDW    Methicillin resistance NOT DETECTED NOT DETECTED Final   Streptococcus species NOT DETECTED NOT DETECTED Final   Streptococcus agalactiae NOT DETECTED NOT DETECTED Final   Streptococcus pneumoniae NOT DETECTED NOT DETECTED Final   Streptococcus pyogenes NOT DETECTED NOT DETECTED Final   Acinetobacter baumannii NOT DETECTED NOT DETECTED Final   Enterobacteriaceae species NOT DETECTED NOT DETECTED Final   Enterobacter cloacae complex NOT DETECTED NOT DETECTED Final   Escherichia coli NOT DETECTED NOT DETECTED Final   Klebsiella oxytoca NOT DETECTED NOT DETECTED Final   Klebsiella pneumoniae NOT DETECTED NOT DETECTED Final   Proteus species NOT DETECTED NOT DETECTED Final   Serratia marcescens NOT DETECTED NOT DETECTED Final   Haemophilus influenzae NOT DETECTED NOT DETECTED Final   Neisseria meningitidis NOT DETECTED NOT DETECTED Final   Pseudomonas aeruginosa NOT DETECTED NOT DETECTED Final   Candida albicans NOT DETECTED NOT DETECTED Final   Candida glabrata NOT DETECTED NOT DETECTED Final   Candida krusei NOT DETECTED NOT DETECTED Final   Candida parapsilosis NOT DETECTED NOT DETECTED Final    Candida tropicalis NOT DETECTED NOT DETECTED Final    Comment: Performed at [facility], [place] [address], [place], [place] [address]  MRSA PCR Screening     Status: Abnormal   Collection Time: 05/29/19  4:17 AM   Specimen: Nasal Mucosa; Nasopharyngeal  Result Value Ref Range Status   MRSA by PCR POSITIVE (A) NEGATIVE Final    Comment:        The GeneXpert MRSA Assay (FDA approved for NASAL specimens only), is one component of a comprehensive MRSA colonization surveillance program. It is not intended to diagnose MRSA infection nor to guide or monitor treatment for MRSA infections. RESULT CALLED TO, READ BACK BY AND VERIFIED WITH: PHILLIPS,T RN [phone] 05/29/2019 MITCHELL,L Performed at [facility], [place] [address] [place], [place] [address]   Blood culture (routine x 2)     Status: Abnormal  Collection Time: 05/29/19  4:23 AM   Specimen: BLOOD  Result Value Ref Range Status   Specimen Description BLOOD LEFT ANTECUBITAL  Final   Special Requests   Final    BOTTLES DRAWN AEROBIC AND ANAEROBIC Blood Culture results may not be optimal due to an inadequate volume of blood received in culture bottles   Culture  Setup Time   Final    GRAM POSITIVE COCCI CRITICAL VALUE NOTED.  VALUE IS CONSISTENT WITH PREVIOUSLY REPORTED AND CALLED VALUE. IN BOTH AEROBIC AND ANAEROBIC BOTTLES    Culture (A)  Final    STAPHYLOCOCCUS AUREUS SUSCEPTIBILITIES PERFORMED ON PREVIOUS CULTURE WITHIN THE LAST 5 DAYS. Performed at [facility], [place] [address], [place], [place] [address]    Report Status 05/31/2019 FINAL  Final  Culture, blood (routine x 2)     Status: None   Collection Time: 05/31/19  4:49 PM   Specimen: BLOOD RIGHT HAND  Result Value Ref Range Status   Specimen Description BLOOD RIGHT HAND  Final   Special Requests   Final    BOTTLES DRAWN AEROBIC AND ANAEROBIC Blood Culture adequate volume   Culture   Final    NO GROWTH 5 DAYS Performed at [facility], [place] [address], [place], [place] [address]    Report Status 06/05/2019 FINAL  Final  Culture, blood (routine x 2)     Status: None   Collection Time: 05/31/19  7:49 PM   Specimen: BLOOD  Result Value Ref Range Status   Specimen Description BLOOD SITE NOT SPECIFIED  Final   Special Requests   Final    BOTTLES DRAWN AEROBIC ONLY Blood Culture results may not be optimal due to an inadequate volume of blood received in culture bottles   Culture   Final    NO GROWTH 5 DAYS Performed at [facility], [place] [address], [place], [place] [address]    Report Status 06/05/2019 FINAL  Final    Coagulation Studies: No results for input(s): LABPROT, INR in the last 72 hours.  Urinalysis: No results for input(s): COLORURINE, LABSPEC, PHURINE, GLUCOSEU, HGBUR, BILIRUBINUR, KETONESUR, PROTEINUR, UROBILINOGEN, NITRITE, LEUKOCYTESUR in the last 72 hours.  Invalid input(s): APPERANCEUR    Imaging: Vas US Duplex Dialysis Access (avf, Avg)  Result Date: 06/04/2019 DIALYSIS ACCESS Reason for Exam: Difficulity cannulating left arm fistula. Access Type: Basilic vein transposition. Limitations: Body Habitus Comparison Study: Last study done on 05/20/19 Performing Technologist: Oda Cogan RDMS, RVT Supporting Technologist: June Leap RDMS, RVT  Examination Guidelines: A complete evaluation includes B-mode imaging, spectral Doppler, color Doppler, and power Doppler as needed of all accessible portions of each vessel. Unilateral testing is considered an integral part of a complete examination. Limited examinations for reoccurring indications may be performed as noted.  Findings: +--------------------+----------+-----------------+--------+ AVF                 PSV (cm/s)Flow Vol (mL/min)Comments +--------------------+----------+-----------------+--------+ Native artery inflow   362          1068                +--------------------+----------+-----------------+--------+ AVF Anastomosis        697                               +--------------------+----------+-----------------+--------+  +------------+----------+-------------+----------+----------------+ OUTFLOW VEINPSV (cm/s)Diameter (cm)Depth (cm)    Describe     +------------+----------+-------------+----------+----------------+ Prox UA  148        0.97        0.81   Fluid collection +------------+----------+-------------+----------+----------------+ Mid UA         184        0.72        0.64                    +------------+----------+-------------+----------+----------------+ Dist UA        283        0.60        0.37                    +------------+----------+-------------+----------+----------------+   Summary: Patent arteriovenous fistula with small amount of fluid collection location in the proximal upper arm.  *See table(s) above for measurements and observations.  Diagnosing physician: Deitra Mayo MD Electronically signed by Deitra Mayo MD on 06/04/2019 at 2:49:10 PM.   --------------------------------------------------------------------------------   Final      Medications:   . sodium chloride    . sodium chloride 10 mL/hr at 06/04/19 0931  .  ceFAZolin (ANCEF) IV 2 g (06/05/19 2124)   . albuterol  10 mg Nebulization Once  . aspirin EC  81 mg Oral Daily  . atorvastatin  40 mg Oral Daily  . chlorhexidine  15 mL Mouth Rinse BID  . Chlorhexidine Gluconate Cloth  6 each Topical Q0600  . Chlorhexidine Gluconate Cloth  6 each Topical Q0600  . clotrimazole   Topical BID  . doxercalciferol  1 mcg Oral Q M,W,F-HD  . ferric citrate  630 mg Oral TID WC  . heparin  5,000 Units Subcutaneous Q8H  . insulin aspart  0-20 Units Subcutaneous TID WC  . insulin aspart  0-5 Units Subcutaneous QHS  . mouth rinse  15 mL Mouth Rinse BID   acetaminophen **OR** acetaminophen, alteplase, bisacodyl, docusate, ferric citrate, heparin, lidocaine, lidocaine (PF), lidocaine-prilocaine, oxyCODONE-acetaminophen **AND**  oxyCODONE, pentafluoroprop-tetrafluoroeth, senna-docusate  Assessment/ Plan:   ESRD-Monday Wednesday Friday dialysis line holiday weekend replacing  06/03/2019 successful dialysis with removal of 3 L ultrafiltration 06/03/2019.  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  Appears to have some mild hyperphosphatemia.  Recommend continued dietary and binder compliance education.  Will increase Auryxia.  HTN/VOL-appears to be stable at this time no acute issues.  His blood pressure slightly elevated predialysis we will continue to follow  ACCESS-dialysis catheter 06/03/2019 prior to dialysis.  Line holiday for MSSA bacteremia over the weekend.  Now with new tunneled dialysis catheter.  MSSA bacteremia Ancef per primary team vascular surgery moved tunneled cath 05/31/2019 for line holiday repeat blood cultures no growth to date.  TEE negative for endocarditis.  Recommend completion of 4 weeks Ancef in total with surveillance of cultures 2 weeks after completion of therapy.  Diabetes per primary team  Altered mental status status post head CT no intracranial process noted   Patient appears stable from a renal standpoint for discharge   LOS: [place] @TODAY @9 :22 AM

## 2019-06-06 NOTE — Progress Notes (Addendum)
PT Cancellation Note  Patient Details Name: MARGO NAULA MRN: [id number] DOB: 1966-02-29   Cancelled Treatment:    Reason Eval/Treat Not Completed: Patient at procedure or test/unavailable. Pt in HD this AM. Checked back in PM and pt had just returned to bed and had on BiPap is preporation of a nap. Requested therapy come back tomorrow.  Benjiman Core, PTA Pager [phone] Acute Rehab  Allena Katz 06/06/2019, 9:46 AM

## 2019-06-07 ENCOUNTER — Encounter: Payer: Self-pay | Admitting: *Deleted

## 2019-06-07 ENCOUNTER — Other Ambulatory Visit: Payer: Self-pay | Admitting: *Deleted

## 2019-06-07 NOTE — Progress Notes (Signed)
Per MD, patient does not qualify for Bipap and patient is able to discharge. [facility] and patient aware and will follow up with his PCP.   Percell Locus Moni Rothrock LCSW [phone]

## 2019-06-07 NOTE — Patient Outreach (Addendum)
[facility]) [place] Telephone Outreach PCP office completes Transition of Care follow up post-hospital discharge Post-hospital discharge day # 1  06/07/2019  Seth [name] January 07, 1966 [id number]  10:30 am:  Successful telephone outreach to Seth [name], 53 y/o male referred to [place] by [facility] Liaison RN CM after recent hospitalization August 18-27, 2020 for acute on chronic hypercarbic respiratory failure presumably due to obesity hypoventilation syndrome; patient was also noted to have MSSA bacteremia while hospitalized.  He required emergent hemodialysis and intubation at time of hospital admission.  Patient was discharged home from hospital to self-care with home health services for assistance with prescribed IV antibiotics.   Patient has history including, but not limited to, HTN/ HLD; ESRD- on hemodialysis; DM- type II; OSA; obesity with BMI 49; previous history NSTEMI.  HIPAA/ identity verified with patient and I began explaining purpose of call to patient, who immediately stated that he needed to call me back and could not currently talk to me at this time.  Encouraged patient to promptly return my call and he agreed to do so and ended call without further conversation.  Unfortunately, I did not receive call-back from patient within 4 hours of my call to him.  Plan:  Will place [facility] CM unsuccessful patient outreach letter in mail requesting call back in writing  Will make primary [place] RN CM aware of today's unsuccessful outreach attempt to engage patient with [facility] CM services to ensure that second call attempt is placed to patient within 4 business days  Oneta Rack, RN, BSN, [place] [facility]  [phone]

## 2019-06-09 DIAGNOSIS — G4733 Obstructive sleep apnea (adult) (pediatric): Secondary | ICD-10-CM | POA: Diagnosis not present

## 2019-06-09 DIAGNOSIS — J9621 Acute and chronic respiratory failure with hypoxia: Secondary | ICD-10-CM | POA: Diagnosis not present

## 2019-06-09 DIAGNOSIS — G8929 Other chronic pain: Secondary | ICD-10-CM | POA: Diagnosis not present

## 2019-06-09 DIAGNOSIS — E11319 Type 2 diabetes mellitus with unspecified diabetic retinopathy without macular edema: Secondary | ICD-10-CM | POA: Diagnosis not present

## 2019-06-09 DIAGNOSIS — N186 End stage renal disease: Secondary | ICD-10-CM | POA: Diagnosis not present

## 2019-06-09 DIAGNOSIS — Z6841 Body Mass Index (BMI) 40.0 and over, adult: Secondary | ICD-10-CM | POA: Diagnosis not present

## 2019-06-09 DIAGNOSIS — E1122 Type 2 diabetes mellitus with diabetic chronic kidney disease: Secondary | ICD-10-CM | POA: Diagnosis not present

## 2019-06-09 DIAGNOSIS — Z992 Dependence on renal dialysis: Secondary | ICD-10-CM | POA: Diagnosis not present

## 2019-06-09 DIAGNOSIS — I252 Old myocardial infarction: Secondary | ICD-10-CM | POA: Diagnosis not present

## 2019-06-09 DIAGNOSIS — M549 Dorsalgia, unspecified: Secondary | ICD-10-CM | POA: Diagnosis not present

## 2019-06-09 DIAGNOSIS — J9601 Acute respiratory failure with hypoxia: Secondary | ICD-10-CM | POA: Diagnosis not present

## 2019-06-09 DIAGNOSIS — E785 Hyperlipidemia, unspecified: Secondary | ICD-10-CM | POA: Diagnosis not present

## 2019-06-09 DIAGNOSIS — D631 Anemia in chronic kidney disease: Secondary | ICD-10-CM | POA: Diagnosis not present

## 2019-06-09 DIAGNOSIS — E662 Morbid (severe) obesity with alveolar hypoventilation: Secondary | ICD-10-CM | POA: Diagnosis not present

## 2019-06-09 DIAGNOSIS — I120 Hypertensive chronic kidney disease with stage 5 chronic kidney disease or end stage renal disease: Secondary | ICD-10-CM | POA: Diagnosis not present

## 2019-06-09 DIAGNOSIS — Z794 Long term (current) use of insulin: Secondary | ICD-10-CM | POA: Diagnosis not present

## 2019-06-10 DIAGNOSIS — N186 End stage renal disease: Secondary | ICD-10-CM | POA: Diagnosis not present

## 2019-06-10 DIAGNOSIS — Z992 Dependence on renal dialysis: Secondary | ICD-10-CM | POA: Diagnosis not present

## 2019-06-11 ENCOUNTER — Telehealth: Payer: Self-pay

## 2019-06-11 ENCOUNTER — Other Ambulatory Visit: Payer: Self-pay

## 2019-06-11 ENCOUNTER — Ambulatory Visit (INDEPENDENT_AMBULATORY_CARE_PROVIDER_SITE_OTHER): Payer: No Typology Code available for payment source | Admitting: Internal Medicine

## 2019-06-11 VITALS — BP 168/107 | HR 81 | Temp 98.1°F | Ht 67.0 in | Wt 314.6 lb

## 2019-06-11 DIAGNOSIS — E662 Morbid (severe) obesity with alveolar hypoventilation: Secondary | ICD-10-CM | POA: Diagnosis not present

## 2019-06-11 DIAGNOSIS — M549 Dorsalgia, unspecified: Secondary | ICD-10-CM

## 2019-06-11 DIAGNOSIS — G9520 Unspecified cord compression: Secondary | ICD-10-CM | POA: Diagnosis not present

## 2019-06-11 DIAGNOSIS — J9622 Acute and chronic respiratory failure with hypercapnia: Secondary | ICD-10-CM

## 2019-06-11 DIAGNOSIS — Z6841 Body Mass Index (BMI) 40.0 and over, adult: Secondary | ICD-10-CM

## 2019-06-11 DIAGNOSIS — E1122 Type 2 diabetes mellitus with diabetic chronic kidney disease: Secondary | ICD-10-CM

## 2019-06-11 DIAGNOSIS — Z992 Dependence on renal dialysis: Secondary | ICD-10-CM

## 2019-06-11 DIAGNOSIS — Z8619 Personal history of other infectious and parasitic diseases: Secondary | ICD-10-CM | POA: Diagnosis not present

## 2019-06-11 DIAGNOSIS — Z79891 Long term (current) use of opiate analgesic: Secondary | ICD-10-CM

## 2019-06-11 DIAGNOSIS — G8929 Other chronic pain: Secondary | ICD-10-CM | POA: Diagnosis not present

## 2019-06-11 DIAGNOSIS — N186 End stage renal disease: Secondary | ICD-10-CM | POA: Diagnosis not present

## 2019-06-11 MED ORDER — OXYCODONE-ACETAMINOPHEN 10-325 MG PO TABS: 1.0000 | ORAL_TABLET | Freq: Three times a day (TID) | ORAL | 0 refills | Status: DC | PRN

## 2019-06-11 NOTE — Assessment & Plan Note (Signed)
Patient reports that he is taking oxycodone-acetaminophen as prescribed, onely three times per day. Per PDMP review, he is refilling his prescriptions appropriately. At his last appointment, a blood tox screen was obtained since patient is unable to make urine (ESRD). Results were actually negative for Oxycodone, which is unexpected and concerning. It is possible that dialysis clears the Oxycodone from his blood stream. We requested that he have another tox screen today, however patient denied, stating he is a very difficult stick. He insisted that he will have a tox screen conducted at his HD center instead. Given patient has a good history with the clinic, prescription was refilled for 1 month, but he will need a blood tox screen prior to next refill.   Plan:  - Blood tox screen before next refill - Refilled Oxycodone-Acetaminophen 10-325mg  q8h for 30 days

## 2019-06-11 NOTE — Assessment & Plan Note (Signed)
Continues to regularly attend his HD on MWF without missing sessions.

## 2019-06-11 NOTE — Progress Notes (Addendum)
   CC: Acute on chronic acute respiratory failure follow up  HPI:  [name] is a 53 y.o. with a PMHx of ESRD on HD MWF, T2DM, Obesity hypoventilation disorder with chronic hypercarbia leading to recent hospitalization who presents to the clinic for a Acute on chronic acute respiratory failure follow up.   Seth [name] reports that he is doing very well today.  Besides his chronic back pain, he denies any new complaints at this time.  He continues to visit his dialysis center as as instructed, where he is also provided his Ancef for recent MSSA bacteremia.   Patient is a bit upset that he has not received his BiPAP yet.  Otherwise no worsening shortness of breath at this time.  In regards to his chronic back pain, due to cord compression, he notes that his recent oxycodone was only refilled with 75 pills instead of the normal 90.  He reports he currently has 9 left, which is enough for 3 days.  Past Medical History:  Diagnosis Date  . Diabetes mellitus    INSULIN DEPENDENT DIABETES  . Dialysis patient (Seth [name])   . 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 ESRD (end stage renal disease) on dialysis [facility])    [facility] M/W/F  . Hypertension   . 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 Sleep apnea    USES CPAP  . Type 2 diabetes mellitus ([place])    Review of Systems:  Review of Systems  Constitutional: Negative for chills and fever.  Respiratory: Negative for shortness of breath (no acute SOB).   Cardiovascular: Negative for chest pain.  Musculoskeletal: Positive for back pain.  All other systems reviewed and are negative.  Physical Exam:  Vitals:   06/11/19 1020  BP: (!) 168/107  Pulse: 81  Temp: 98.1 F (36.7 C)  TempSrc: Oral  SpO2: 94%  Weight: (!) 314 lb 9.6 oz (142.7 kg)  Height: 5\' 7"  (1.702 m)   Physical Exam Constitutional:      General: He is not in acute distress.    Appearance: He is obese. He is not  toxic-appearing.  Cardiovascular:     Rate and Rhythm: Normal rate and regular rhythm.     Heart sounds: No murmur.  Pulmonary:     Effort: No accessory muscle usage or respiratory distress.     Breath sounds: Decreased air movement present. Examination of the right-lower field reveals decreased breath sounds. Examination of the left-lower field reveals decreased breath sounds. Decreased breath sounds present. No wheezing, rhonchi or rales.  Abdominal:     Tenderness: There is no abdominal tenderness. There is no guarding.  Skin:    General: Skin is warm and dry.  Neurological:     General: No focal deficit present.     Mental Status: He is alert and oriented to person, place, and time. Mental status is at baseline.  Psychiatric:        Mood and Affect: Mood normal.        Behavior: Behavior normal.    Assessment & Plan:   See Encounters Tab for problem based charting.  Patient seen with Dr. Philipp Ovens

## 2019-06-11 NOTE — Telephone Encounter (Signed)
VO for [facility] for disease management, opened case sun 8/29 1x week for 6 weeks Do you agree?

## 2019-06-11 NOTE — Patient Instructions (Signed)
It was nice seeing you today! Thank you for choosing [facility] for your Primary Care.    Today we talked about:   We will look into getting your Bipap set up and someone from the office will contact you.

## 2019-06-11 NOTE — Telephone Encounter (Signed)
Estill Bamberg with [facility] requesting VO for nursing. Please call back.

## 2019-06-11 NOTE — Assessment & Plan Note (Signed)
Seth [name] was recently hospitalized and required intubation due to acute on chronic respiratory failure with hypercarbia felt to be due to obesity hypoventilation syndrome.  He was strongly encouraged to start using a BiPAP, however it was not able to be obtained for home prior to discharge due to insurance.  Per chart review, he is working with [facility] who attempted to contact him.  [name] has been in contact with the agency and will follow-up to make sure he receives his BiPAP.  Otherwise, patient has been recovering well from hospitalization.  Plan:  - Follow up to make sure patient received Bipap at home

## 2019-06-12 ENCOUNTER — Telehealth: Payer: Self-pay

## 2019-06-12 NOTE — Telephone Encounter (Signed)
Cindy with [facility] requesting attending physician name to sign orders with Dr. Darrick Meigs. Please call back.

## 2019-06-12 NOTE — Telephone Encounter (Signed)
Gave dr hoffman's name as attending

## 2019-06-12 NOTE — Telephone Encounter (Signed)
VO for "verbal order".

## 2019-06-13 ENCOUNTER — Encounter: Payer: Self-pay | Admitting: *Deleted

## 2019-06-13 ENCOUNTER — Other Ambulatory Visit: Payer: Self-pay | Admitting: *Deleted

## 2019-06-13 NOTE — Patient Outreach (Signed)
[name]) Care Management  06/13/2019  Seth [name] Sep 08, 1966 [id number]   Referral received from hospital Liaison after recent hospitalization August 18-27, 2020 for acute on chronic hypercarbic respiratory failure presumably due to obesity hypoventilation syndrome; patient was also noted to have MSSA bacteremia while hospitalized.  Discharged home from hospital to self-care with home health services for assistance with prescribed IV antibiotics. Patient has history including, but not limited to, HTN/ HLD; ESRD- on hemodialysis; DM- type II; OSA; obesity with BMI 49; previous history NSTEMI.  Primary MD office will complete transition of care assessment.  Call placed to member, identity verified.  This care manager introduced self and stated purpose of call.  [facility] care management services explained.  He report he doing better.  Lives with sister, supportive with management of care.  Was seen by primary MD on Monday.  This care manager received call from [facility] regarding needing BIPAP and being sent home without it.  Message left for [facility] that per notes, member did not qualify for BIPAP.  Member also made aware, confirms that he does have CPAP machine at home.    Report using SCAT for dialysis treatments, check weights daily before and after treatment on dialysis days.  Report weight today 137 kg.  Denies shortness of breath.  Check blood pressure and blood sugar daily, report blood pressure being "pretty good", blood sugar today 164.    Denies any urgent concerns at this time, next follow up with primary MD on 10/1.  Will follow up with member within the next 2 weeks.  Fall Risk  06/13/2019 06/11/2019 04/29/2019 11/20/2018 07/10/2018  Falls in the past year? 0 0 0 0 No  Number falls in past yr: 0 - - - -  Comment - - - - -  Injury with Fall? 0 - - - -  Risk for fall due to : - Impaired balance/gait;Impaired mobility - - -  Follow up - Falls prevention discussed Falls  prevention discussed Falls prevention discussed -   Depression screen [facility] 2/9 06/11/2019 04/29/2019 11/20/2018 07/10/2018 06/21/2018  Decreased Interest 0 0 1 0 0  Down, Depressed, Hopeless 0 0 1 0 0  PHQ - 2 Score 0 0 2 0 0  Altered sleeping - 0 1 - -  Tired, decreased energy - 0 0 - -  Change in appetite - 0 0 - -  Feeling bad or failure about yourself  - 0 0 - -  Trouble concentrating - 0 1 - -  Moving slowly or fidgety/restless 0 0 0 - -  Suicidal thoughts - - 0 - -  PHQ-9 Score - 0 4 - -  Difficult doing work/chores Not difficult at all Not difficult at all Somewhat difficult - -  Some recent data might be hidden     [facility] CM Care Plan Problem One     Most Recent Value  Care Plan Problem One  Risk for hospitalization related to MSSA Bacteremia as evidenced by recent hospital admission  Role Documenting the Problem One  Care Management Coordinator  Care Plan for Problem One  Active  [facility] Long Term Goal   Member will not be readmitted to hospital within the next 31 days  [facility]N Long Term Goal Start Date  06/13/19  Interventions for Problem One Long Term Goal  Discharge instructions reviewed with member.  Educated on importance of following plan of care (home health, follow up with MD, etc) in effort to decrease risk of  readmission  [facility] CM Short Term Goal #1   Member will report completion of antibiotic therapy within the next 4 weeks  [facility]N CM Short Term Goal #1 Start Date  06/13/19  Interventions for Short Term Goal #1  Educated member of importance of attending dialysis treatments in effort to receive antibiotic doses.  THN CM Short Term Goal #2   Member will report decrease in shortness of breath episodes over the next 3 weeks  THN CM Short Term Goal #2 Start Date  06/13/19  Interventions for Short Term Goal #2  Discussed importance of use of CPAP nightly in effort to manage OSA     Valente David, RN, MSN [place] Manager [phone]

## 2019-06-13 NOTE — Progress Notes (Signed)
Internal Medicine Clinic Attending  I saw and evaluated the patient.  I personally confirmed the key portions of the history and exam documented by Dr. Basaraba and I reviewed pertinent patient test results.  The assessment, diagnosis, and plan were formulated together and I agree with the documentation in the resident's note.    

## 2019-06-13 NOTE — Addendum Note (Signed)
Addended by: Jodean Lima on: 06/13/2019 01:32 PM   Modules accepted: Level of Service

## 2019-06-14 ENCOUNTER — Encounter: Payer: Self-pay | Admitting: *Deleted

## 2019-06-14 NOTE — Telephone Encounter (Signed)
Diabetes Management Follow Up Seth [name] is a 53 y.o. male who was contacted for DM management. Identity was verified using date of birth and address.  Diabetes medications: semapglutide 0.25 mg once weekly, insulin degludec 14 units once daily; patient correctly reports DM regimen and adherence  Patient was not at home and did not give BG readings. Patient denies hypoglycemia.   A/P Patient is tolerating regimen and reports decreased appetite which he was pleased with. Try to f/u to get home BG measurements.  A1C due in October.   Advised to contact clinic or seek medical attention if concerns or symptoms arise  The patient verbalized understanding of information provided by repeating back concepts discussed.  Follow-up No follow-ups on file.  Brenton Grills

## 2019-06-20 ENCOUNTER — Other Ambulatory Visit: Payer: Self-pay

## 2019-06-20 MED ORDER — OZEMPIC (0.25 OR 0.5 MG/DOSE) 2 MG/1.5ML ~~LOC~~ SOPN: 0.2500 mg | PEN_INJECTOR | SUBCUTANEOUS | 11 refills | Status: DC

## 2019-06-20 NOTE — Patient Outreach (Signed)
[name]) Care Management  06/20/2019  Seth [name] Jan 02, 1966 [id number]   Social work referral received from [facility], [place], on 06/14/19 to contact patient regarding difficulty with paying utility bill. Successful outreach to patient today.  Patient stated that he utilizes a window unit to cool the house and it is a large house.  Per patient, the window unit was recently replaced, but electricity bill remains high.  Inquired if this has been discussed with the landlord.  Per patient, he reported this to the administrative assistant a couple of weeks ago but has not heard anything from the landlord.  Patient was encouraged to follow up with landlord to determine if some sort of modification could be made to assist with efficiency of home.  Patient acknowledged that bill will likely remain high if some sort of modification is not completed.  He did ask for list of financial resources to be sent to him.  Will follow up within the next two weeks to ensure receipt.  Ronn Melena, BSW Social Worker [phone]

## 2019-06-20 NOTE — Telephone Encounter (Signed)
Pt seen on 06/11/19 for HFU.Seth [name], Seth Leis Cassady9/10/20203:08 PM

## 2019-06-24 ENCOUNTER — Other Ambulatory Visit: Payer: Self-pay | Admitting: *Deleted

## 2019-06-24 NOTE — Patient Outreach (Signed)
[facility]) Care Management  06/24/2019  Seth [name] 08-04-1966 [id number]   Call placed to member to follow up on management of infection and to complete initial assessment.  He report his breathing is much better, denies any complaints of pain or chest discomfort.  Denies any fever or signs/symptoms of infection.  He has continued his dialysis treatments and IV antibiotic therapy.  Has follow up with infectious disease on 10/1.  Continue to monitor daily weights, report decreased to 138 kg, state he has been trying to monitor his diet a little better.    Denies any urgent concerns at this time, will follow up within the next 2-3 weeks.  THN CM Care Plan Problem One     Most Recent Value  Care Plan Problem One  Risk for hospitalization related to MSSA Bacteremia as evidenced by recent hospital admission  Role Documenting the Problem One  Care Management [place] for Problem One  Active  [place] Term Goal   Member will not be readmitted to hospital within the next 31 days  [facility]N Long Term Goal Start Date  06/13/19  Interventions for Problem One Long Term Goal  Member educated on advised diet (low sodium, renal) and importance of following in effort to decrease risk of fluid overload and readmission  [facility]N CM Short Term Goal #1   Member will report completion of antibiotic therapy within the next 4 weeks  [facility]N CM Short Term Goal #1 Start Date  06/13/19  Interventions for Short Term Goal #1  Confirmed member is still on antibiotic therapy through dialysis treatments.  Advised of importance of continuing therapy  THN CM Short Term Goal #2   Member will report decrease in shortness of breath episodes over the next 3 weeks  THN CM Short Term Goal #2 Start Date  06/13/19  [facility] CM Short Term Goal #2 Met Date  06/24/19     Valente David, RN, MSN [place] Manager [phone]

## 2019-06-28 ENCOUNTER — Telehealth: Payer: Self-pay

## 2019-06-28 ENCOUNTER — Encounter: Payer: Self-pay | Admitting: Vascular Surgery

## 2019-06-28 ENCOUNTER — Ambulatory Visit (INDEPENDENT_AMBULATORY_CARE_PROVIDER_SITE_OTHER): Payer: Self-pay | Admitting: Vascular Surgery

## 2019-06-28 VITALS — BP 155/87 | HR 92 | Temp 96.5°F | Resp 18 | Ht 67.5 in | Wt 314.0 lb

## 2019-06-28 DIAGNOSIS — N186 End stage renal disease: Secondary | ICD-10-CM

## 2019-06-28 DIAGNOSIS — Z992 Dependence on renal dialysis: Secondary | ICD-10-CM

## 2019-06-28 NOTE — Progress Notes (Signed)
[name], male   DOB: 27-Feb-1966, 53 y.o.   MRN: [id number]  Reason for Consult: Follow-up   Referred by Seth Hansen, MD  Subjective:     HPI:  Seth [name] is a 53 y.o. male end-stage renal disease.  He is undergone a two-stage basilic vein fistula on the left.  Hand does not have any issues.  They have had difficulty cannulating as dialysis center I have ordered no deeper needles.  He otherwise has no issues currently dialyzing via catheter.  Past Medical History:  Diagnosis Date  . Acute on chronic respiratory failure with hypoxia and hypercapnia ([place]) 05/29/2019  . Diabetes mellitus    INSULIN DEPENDENT DIABETES  . Dialysis patient ([place])   . 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 ESRD (end stage renal disease) on dialysis [facility])    [facility] M/W/F  . Hypertension   . 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 Sleep apnea    USES CPAP  . Type 2 diabetes mellitus ([name])    Family History  Problem Relation Age of Onset  . Diabetes Mother   . Cancer Mother   . Diabetes Sister   . Diabetes Brother    Past Surgical History:  Procedure Laterality Date  . A/V FISTULAGRAM N/A 10/18/2018   Procedure: A/V FISTULAGRAM;  Surgeon: Marty Heck, MD;  Location: [place] CV LAB;  Service: Cardiovascular;  Laterality: N/A;  . A/V FISTULAGRAM Left 03/28/2019   Procedure: A/V Fistulagram;  Surgeon: Marty Heck, MD;  Location: [place] CV LAB;  Service: Cardiovascular;  Laterality: Left;  . AV FISTULA PLACEMENT  05/03/2012   Procedure: ARTERIOVENOUS (AV) FISTULA CREATION;  Surgeon: Mal [name], MD;  Location: [place];  Service: Vascular;  Laterality: Right;  . AV FISTULA PLACEMENT Left 10/23/2018   Procedure: ARTERIOVENOUS (AV) FISTULA CREATION;  Surgeon: Waynetta Sandy, MD;  Location: [place];  Service: Vascular;  Laterality: Left;  . BASCILIC VEIN TRANSPOSITION Left 04/02/2019   Procedure: BASILIC VEIN TRANSPOSITION SECOND STAGE LEFT;  Surgeon: Waynetta Sandy, MD;  Location: [place];  Service: Vascular;  Laterality: Left;  . INSERTION OF DIALYSIS CATHETER Left 12/03/2018   Procedure: INSERTION OF DIALYSIS CATHETER;  Surgeon: Marty Heck, MD;  Location: [place];  Service: Vascular;  Laterality: Left;  . INSERTION OF DIALYSIS CATHETER Left 06/03/2019   Procedure: INSERTION OF DIALYSIS CATHETER;  Surgeon: Marty Heck, MD;  Location: [place];  Service: Vascular;  Laterality: Left;  . ORIF MANDIBULAR FRACTURE N/A 11/10/2017   Procedure: OPEN REDUCTION INTERNAL FIXATION (ORIF) MANDIBULAR FRACTURE POSSIBLE MAXILLARY MANDIBULAR FIXATION;  Surgeon: Jodi Marble, MD;  Location: [place];  Service: ENT;  Laterality: N/A;  . REVISON OF ARTERIOVENOUS FISTULA Right 05/31/2018   Procedure: REVISION OF ARTERIOVENOUS FISTULA  RIGHT ARM WITH INTERPOSITION ARTEGRAFT 6MM X 16CM GRAFT;  Surgeon: Waynetta Sandy, MD;  Location: [place];  Service: Vascular;  Laterality: Right;  . TEE WITHOUT CARDIOVERSION N/A 07/10/2015   Procedure: TRANSESOPHAGEAL ECHOCARDIOGRAM (TEE);  Surgeon: Josue Hector, MD;  Location: [place];  Service: Cardiovascular;  Laterality: N/A;  . TEE WITHOUT CARDIOVERSION N/A 12/07/2018   Procedure: TRANSESOPHAGEAL ECHOCARDIOGRAM (TEE);  Surgeon: Acie Fredrickson Wonda Cheng, MD;  Location: [place];  Service: Cardiovascular;  Laterality: N/A;  . TEE WITHOUT CARDIOVERSION N/A 06/03/2019   Procedure: Transesophageal Echocardiogram (Tee);  Surgeon: Acie Fredrickson Wonda Cheng, MD;  Location: [place];  Service:  Cardiovascular;  Laterality: N/A;  . tracheostomy removal    . TRACHEOSTOMY TUBE PLACEMENT N/A 08/20/2013   Procedure: TRACHEOSTOMY Revision;  Surgeon: Melida Quitter, MD;  Location: [place];  Service: ENT;  Laterality: N/A;  . UPPER EXTREMITY VENOGRAPHY Bilateral 03/28/2019   Procedure: UPPER EXTREMITY VENOGRAPHY;  Surgeon: Marty Heck, MD;  Location: [place] CV LAB;  Service: Cardiovascular;  Laterality: Bilateral;    Short Social History:  Social History   Tobacco Use  . Smoking status: Never Smoker  . Smokeless tobacco: Never Used  Substance Use Topics  . Alcohol use: No    Frequency: Never    No Known Allergies  Current Outpatient Medications  Medication Sig Dispense Refill  . amLODipine (NORVASC) 10 MG tablet Take 1 tablet (10 mg total) by mouth daily. 90 tablet 1  . aspirin EC 81 MG tablet Take 81 mg by mouth daily.    Marland Kitchen atorvastatin (LIPITOR) 40 MG tablet Take 40 mg by mouth daily.    Marland Kitchen ceFAZolin (ANCEF) IVPB Inject 2 g into the vein every Monday, Wednesday, and Friday with hemodialysis for 23 days. Indication:  bactermia Last Day of Therapy:  06/28/2019 Labs - Once weekly:  CBC/D and BMP, Labs - Every other week:  ESR and CRP 11 Units 0  . diclofenac sodium (VOLTAREN) 1 % GEL Apply 4 g topically 4 (four) times daily.    . ferric citrate (AURYXIA) 1 GM 210 MG(Fe) tablet Take 420-630 mg by mouth See admin instructions. Take 3 tablets (630 mg) by mouth with meals and 2 tablets (420 mg) with snacks    . insulin aspart (NOVOLOG FLEXPEN) 100 UNIT/ML FlexPen Inject 0-20 Units into the skin 3 (three) times daily with meals.    . insulin degludec (TRESIBA FLEXTOUCH) 100 UNIT/ML SOPN FlexTouch Pen Inject 0.14 mLs (14 Units total) into the skin daily. 2 pen 3  . oxyCODONE-acetaminophen (PERCOCET) 10-325 MG tablet Take 1 tablet by mouth 3 (three) times daily as needed for pain. For pain 90 tablet 0  . Semaglutide,0.25 or 0.5MG/DOS, (OZEMPIC, 0.25 OR 0.5 MG/DOSE,) 2 MG/1.5ML SOPN Inject 0.25 mg into the skin once a week. 1 pen 11  . senna (SENOKOT) 8.6 MG tablet Take 1 tablet by mouth daily.     No current facility-administered medications for this visit.     Review of Systems  Constitutional:  Constitutional negative. HENT: HENT negative.  Eyes: Eyes negative.  Respiratory: Respiratory negative.  Cardiovascular: Cardiovascular  negative.  GI: Gastrointestinal negative.  Musculoskeletal: Musculoskeletal negative.  Skin: Skin negative.  Neurological: Neurological negative. Hematologic: Hematologic/lymphatic negative.  Psychiatric: Psychiatric negative.        Objective:  Objective   Vitals:   06/28/19 1515  BP: (!) 155/87  Pulse: 92  Resp: 18  Temp: (!) 96.5 F (35.8 C)  TempSrc: Temporal  SpO2: 94%  Weight: (!) 314 lb (142.4 kg)  Height: 5' 7.5" (1.715 m)   Body mass index is 48.45 kg/m.  Physical Exam HENT:     Head: Normocephalic.     Mouth/Throat:     Mouth: Mucous membranes are moist.  Eyes:     Pupils: Pupils are equal, round, and reactive to light.  Cardiovascular:     Rate and Rhythm: Normal rate.     Pulses:          Radial pulses are 2+ on the right side and 2+ on the left side.  Pulmonary:     Effort: Pulmonary effort is normal.  Abdominal:  Palpations: Abdomen is soft.  Musculoskeletal: Normal range of motion.        General: No swelling.     Comments: Left upper arm with strong thrill  Skin:    General: Skin is warm.     Capillary Refill: Capillary refill takes less than 2 seconds.  Neurological:     General: No focal deficit present.     Mental Status: He is alert.  Psychiatric:        Mood and Affect: Mood normal.        Behavior: Behavior normal.        Thought Content: Thought content normal.        Judgment: Judgment normal.     Data: I reviewed his previously performed dialysis duplex demonstrates flow volume 1068 mL/min.  Diameter measures up to 0.97 cm.  It is 0.64 cm in the mid upper arm.     Assessment/Plan:     53 year old male status post left two-stage basilic vein fistula.  This is maturing well.  Unfortunately they think it little deep I am unsure how we can get it any more superficial in his arm.  It is okay to cannulate.  If there are issues we will proceed with fistulogram prior to anything else.  Patient demonstrates good understanding can  follow-up PRN.    Waynetta Sandy MD [facility]

## 2019-06-28 NOTE — Telephone Encounter (Signed)
Hi [name], do you think we should increase Ozempic to 0.5 mg?  Thank you for calling patient.  [name]

## 2019-06-28 NOTE — Telephone Encounter (Signed)
Diabetes Management Follow Up Seth [name] is a 53 y.o. male who was contacted for DM management. Identity was verified using date of birth and address.  Diabetes medications: Tresiba 14 units daily, Ozempic 0.25 mg weekly. Patient correctly reports DM regimen and adherence  Home BG average 160 mg/dL (correlates with A1C of around 7.2%) Denies hypoglycemia symptoms Denies yperglycemia symptoms   A/P Blood glucose control: improved  Reviewed appropriate home blood glucose monitoring  BG this morning was 158. Patient reports no readings over 200.   Advised to contact clinic or seek medical attention if concerns or symptoms arise.  The patient verbalized understanding of information provided by repeating back concepts discussed.  Follow-up No follow-ups on file.  Brenton Grills

## 2019-07-04 ENCOUNTER — Other Ambulatory Visit: Payer: Self-pay

## 2019-07-04 NOTE — Patient Outreach (Signed)
[facility]) Care Management  07/04/2019  Seth [name] 12/13/1965 [id number]   Successful follow up call to patient today to ensure receipt of financial resources that were mailed on 06/20/19.  Patient stated that he received mail from [facility] but has not opened it yet.  Reminded him that financial resources were sent.  Closing case at this time but encouraged him to call if additional needs or questions arise.    Ronn Melena, BSW Social Worker [phone]

## 2019-07-09 DIAGNOSIS — G8929 Other chronic pain: Secondary | ICD-10-CM | POA: Diagnosis not present

## 2019-07-09 DIAGNOSIS — G4733 Obstructive sleep apnea (adult) (pediatric): Secondary | ICD-10-CM | POA: Diagnosis not present

## 2019-07-09 DIAGNOSIS — I252 Old myocardial infarction: Secondary | ICD-10-CM | POA: Diagnosis not present

## 2019-07-09 DIAGNOSIS — E1122 Type 2 diabetes mellitus with diabetic chronic kidney disease: Secondary | ICD-10-CM | POA: Diagnosis not present

## 2019-07-09 DIAGNOSIS — E785 Hyperlipidemia, unspecified: Secondary | ICD-10-CM | POA: Diagnosis not present

## 2019-07-09 DIAGNOSIS — N186 End stage renal disease: Secondary | ICD-10-CM | POA: Diagnosis not present

## 2019-07-09 DIAGNOSIS — E662 Morbid (severe) obesity with alveolar hypoventilation: Secondary | ICD-10-CM | POA: Diagnosis not present

## 2019-07-09 DIAGNOSIS — D631 Anemia in chronic kidney disease: Secondary | ICD-10-CM | POA: Diagnosis not present

## 2019-07-09 DIAGNOSIS — J9621 Acute and chronic respiratory failure with hypoxia: Secondary | ICD-10-CM | POA: Diagnosis not present

## 2019-07-09 DIAGNOSIS — M549 Dorsalgia, unspecified: Secondary | ICD-10-CM | POA: Diagnosis not present

## 2019-07-09 DIAGNOSIS — E11319 Type 2 diabetes mellitus with unspecified diabetic retinopathy without macular edema: Secondary | ICD-10-CM | POA: Diagnosis not present

## 2019-07-09 DIAGNOSIS — J9601 Acute respiratory failure with hypoxia: Secondary | ICD-10-CM | POA: Diagnosis not present

## 2019-07-09 DIAGNOSIS — Z992 Dependence on renal dialysis: Secondary | ICD-10-CM | POA: Diagnosis not present

## 2019-07-09 DIAGNOSIS — Z794 Long term (current) use of insulin: Secondary | ICD-10-CM | POA: Diagnosis not present

## 2019-07-09 DIAGNOSIS — I120 Hypertensive chronic kidney disease with stage 5 chronic kidney disease or end stage renal disease: Secondary | ICD-10-CM | POA: Diagnosis not present

## 2019-07-09 DIAGNOSIS — Z6841 Body Mass Index (BMI) 40.0 and over, adult: Secondary | ICD-10-CM | POA: Diagnosis not present

## 2019-07-10 ENCOUNTER — Other Ambulatory Visit: Payer: Self-pay | Admitting: Internal Medicine

## 2019-07-10 DIAGNOSIS — N186 End stage renal disease: Secondary | ICD-10-CM | POA: Diagnosis not present

## 2019-07-10 DIAGNOSIS — Z992 Dependence on renal dialysis: Secondary | ICD-10-CM | POA: Diagnosis not present

## 2019-07-10 DIAGNOSIS — G9520 Unspecified cord compression: Secondary | ICD-10-CM

## 2019-07-10 MED ORDER — OXYCODONE-ACETAMINOPHEN 10-325 MG PO TABS: 1.0000 | ORAL_TABLET | Freq: Three times a day (TID) | ORAL | 0 refills | Status: AC | PRN

## 2019-07-10 NOTE — Telephone Encounter (Signed)
Refill Request ° ° °oxyCODONE-acetaminophen (PERCOCET) 10-325 MG tablet ° ° °[id number] - [place], [place] - [address] °

## 2019-07-11 ENCOUNTER — Inpatient Hospital Stay: Payer: No Typology Code available for payment source | Admitting: Family

## 2019-07-16 ENCOUNTER — Telehealth: Payer: Self-pay | Admitting: *Deleted

## 2019-07-16 ENCOUNTER — Other Ambulatory Visit: Payer: Self-pay | Admitting: *Deleted

## 2019-07-16 DIAGNOSIS — E1122 Type 2 diabetes mellitus with diabetic chronic kidney disease: Secondary | ICD-10-CM | POA: Diagnosis not present

## 2019-07-16 DIAGNOSIS — D631 Anemia in chronic kidney disease: Secondary | ICD-10-CM | POA: Diagnosis not present

## 2019-07-16 DIAGNOSIS — J9601 Acute respiratory failure with hypoxia: Secondary | ICD-10-CM | POA: Diagnosis not present

## 2019-07-16 DIAGNOSIS — J9621 Acute and chronic respiratory failure with hypoxia: Secondary | ICD-10-CM | POA: Diagnosis not present

## 2019-07-16 DIAGNOSIS — N186 End stage renal disease: Secondary | ICD-10-CM | POA: Diagnosis not present

## 2019-07-16 DIAGNOSIS — I120 Hypertensive chronic kidney disease with stage 5 chronic kidney disease or end stage renal disease: Secondary | ICD-10-CM | POA: Diagnosis not present

## 2019-07-16 NOTE — Telephone Encounter (Signed)
[facility]ene Brawn with [facility] calls and states she would lke to disch pt today, he is noncompliant, rude- when she entered the home he was dozing on sofa with a mod amy of THC laying on the coffee table, the odor of THC in the air strongly, a small child present in the room. Pt's 02 sat 84%, she ask pt to sit up and take deep breaths, he told her he didn't need to, he felt fine, that she was the problem. She attempted to educate pt on using cpap when dozing on sofa or to get up at intervals and move around taking deep breaths, he sat up a little, took some deep breaths and after about 10 mins sats came up to low 90's. He denies having any resp, cardiac problems and states his cbg is always good. He stated hes got "a little head cold at the moment and will be fine" HHN ask whether to close case or check back she is given a VO to close case, she also ask about CPS and is encouraged to do as she feels she needs to do. Do you agree?

## 2019-07-16 NOTE — Telephone Encounter (Signed)
VM left for Joen Laura, RN with [facility] to learn contact info for [place]. Hubbard Hartshorn, BSN, RN-BC

## 2019-07-16 NOTE — Patient Outreach (Signed)
[place]) Care Management  07/16/2019  CAM SADUSKY 15-Mar-1966 [id number]   Call placed to member to follow up on recovery of infection. He report this is not a good time to talk, state he will call this care manager back later.  Will await call back, if no call will follow up within the next week on a non-dialysis day.  Valente David, [place], MSN [place] [phone]

## 2019-07-16 NOTE — Telephone Encounter (Signed)
We can call [facility] and ask for his HHN, it was not recorded at the time the office answered the call

## 2019-07-17 NOTE — Telephone Encounter (Signed)
Jenny Reichmann, RN with [facility] called in stating Marcene Brawn is out sick today but that she received Kara's report at the time of the incident. Marcene Brawn told her patient was belligerent and didn't feel he needed [facility] services. Smell of marijuana in the air and a joint on the table in front of patient. A 55 yo child was present in the home. Marcene Brawn did not notify CPS as there were signs the child was well taken care of. If PCP would still like to speak with Marcene Brawn, please call Jenny Reichmann at [phone] and she will relay her contact info. Hubbard Hartshorn, BSN, RN-BC

## 2019-07-17 NOTE — Telephone Encounter (Signed)
I agree with Helen's VO.  Lauren, I don't think I need to chat with Marcene Brawn since he has completed his [place] appts. If she does have any further questions or concerns, I am more than happy to chat with her. Thank you!

## 2019-07-23 ENCOUNTER — Other Ambulatory Visit: Payer: Self-pay | Admitting: *Deleted

## 2019-07-23 NOTE — Patient Outreach (Signed)
[facility]) Care Management  07/23/2019  Seth [name] 17-Oct-1965 [id number]   Call placed to member to follow up on current medical status.  He report this is not a good time talk, state he will call this care manager back when he is available.  Outreach letter sent to member as a reminder to call back.  Will follow up within the next week on a non dialysis day.  Valente David, [place], MSN [place] [phone]

## 2019-07-30 ENCOUNTER — Other Ambulatory Visit: Payer: Self-pay | Admitting: *Deleted

## 2019-07-30 NOTE — Patient Outreach (Signed)
[facility]) Care Management  07/30/2019  MKAI FERRYMAN 09-09-1966 [id number]   Call placed to member to follow up on current medical status, no answer.  HIPAA complaint voice message left.  This is 3rd consecutive unsuccessful attempt for follow up assessment.  If no call back by 10/23 will close case for inability to maintain contact.  Valente David, [place], MSN [place] [phone]

## 2019-08-02 ENCOUNTER — Other Ambulatory Visit: Payer: Self-pay | Admitting: *Deleted

## 2019-08-02 NOTE — Patient Outreach (Signed)
[place]) Care Management  08/02/2019  Seth [name] 10-22-1965 [id number]   No response from member after multiple unsuccessful outreach attempts and letter sent.  Will close case at this time due to inability to maintain contact.  Will notify member and primary MD of case closure.  Valente David, [place], MSN [place] [phone]

## 2019-08-09 ENCOUNTER — Other Ambulatory Visit: Payer: Self-pay

## 2019-08-09 DIAGNOSIS — G9520 Unspecified cord compression: Secondary | ICD-10-CM

## 2019-08-09 DIAGNOSIS — F1190 Opioid use, unspecified, uncomplicated: Secondary | ICD-10-CM

## 2019-08-09 NOTE — Telephone Encounter (Signed)
oxyCODONE-acetaminophen (PERCOCET) 10-325 MG tablet, refill request @  [id number] - [place], [place] - [address] [phone] (Phone) [phone] (Fax)    

## 2019-08-09 NOTE — Telephone Encounter (Signed)
On note review, it appears that he had an appt on 9/1 and that he was supposed to get a drug screen during a dialysis session. Could you please call and confirm whether or not this was done and then if it was, have them fax it to [place] for documentation. If he did not, then I will not be providing further scripts.

## 2019-08-10 DIAGNOSIS — N186 End stage renal disease: Secondary | ICD-10-CM | POA: Diagnosis not present

## 2019-08-10 DIAGNOSIS — Z992 Dependence on renal dialysis: Secondary | ICD-10-CM | POA: Diagnosis not present

## 2019-08-12 ENCOUNTER — Encounter: Payer: Self-pay | Admitting: *Deleted

## 2019-08-12 ENCOUNTER — Other Ambulatory Visit: Payer: Self-pay | Admitting: *Deleted

## 2019-08-12 DIAGNOSIS — F1190 Opioid use, unspecified, uncomplicated: Secondary | ICD-10-CM

## 2019-08-12 NOTE — Telephone Encounter (Signed)
Please call pt back regarding pain med.  

## 2019-08-12 NOTE — Telephone Encounter (Signed)
Returned call to patient who is currently at dialysis. Spoke with his nurse, Hinton Dyer. She states we need to fax a [place] requisition to her at [phone] in order to have serum tox screen done; she cannot take a VO. Spoke with [facility] lab staff. Will need to place future order for Lab 2880/lab collect/[facility]. L. [place], BSN, RN-BC

## 2019-08-12 NOTE — Telephone Encounter (Signed)
Lab order for tox screen has been faxed to [name]. Hubbard Hartshorn, BSN, RN-BC

## 2019-08-12 NOTE — Progress Notes (Signed)
Faxed a copy of the [facility] Requisition to [place] at [phone], [place].  Specimen to be collected by Dialysis Center.  Drug Screen 10 w/Confrma, WB   Test Code 074600   Maryan Rued, PBT 08-12-2019 11:33

## 2019-08-12 NOTE — Telephone Encounter (Signed)
Pt called back, speech slurred, had to ask him to repeat several times. He stated he needs his pain med and his tox screen had always been good. He wanted his med now and ask to speak to someone above the nurse. Informed laurend. And will send to pcp for her to address this issue

## 2019-08-15 ENCOUNTER — Telehealth: Payer: Self-pay | Admitting: *Deleted

## 2019-08-15 ENCOUNTER — Ambulatory Visit (INDEPENDENT_AMBULATORY_CARE_PROVIDER_SITE_OTHER): Payer: Medicare Other | Admitting: Internal Medicine

## 2019-08-15 ENCOUNTER — Encounter: Payer: Self-pay | Admitting: *Deleted

## 2019-08-15 ENCOUNTER — Other Ambulatory Visit: Payer: Self-pay

## 2019-08-15 VITALS — BP 128/60 | HR 92 | Temp 98.1°F | Wt 321.5 lb

## 2019-08-15 DIAGNOSIS — F1290 Cannabis use, unspecified, uncomplicated: Secondary | ICD-10-CM | POA: Diagnosis not present

## 2019-08-15 DIAGNOSIS — Z79899 Other long term (current) drug therapy: Secondary | ICD-10-CM | POA: Diagnosis not present

## 2019-08-15 DIAGNOSIS — E1122 Type 2 diabetes mellitus with diabetic chronic kidney disease: Secondary | ICD-10-CM | POA: Diagnosis not present

## 2019-08-15 DIAGNOSIS — E662 Morbid (severe) obesity with alveolar hypoventilation: Secondary | ICD-10-CM | POA: Diagnosis not present

## 2019-08-15 DIAGNOSIS — M25569 Pain in unspecified knee: Secondary | ICD-10-CM

## 2019-08-15 DIAGNOSIS — G8929 Other chronic pain: Secondary | ICD-10-CM | POA: Diagnosis not present

## 2019-08-15 DIAGNOSIS — F1190 Opioid use, unspecified, uncomplicated: Secondary | ICD-10-CM

## 2019-08-15 DIAGNOSIS — L309 Dermatitis, unspecified: Secondary | ICD-10-CM

## 2019-08-15 DIAGNOSIS — Z79891 Long term (current) use of opiate analgesic: Secondary | ICD-10-CM | POA: Diagnosis not present

## 2019-08-15 DIAGNOSIS — Z992 Dependence on renal dialysis: Secondary | ICD-10-CM | POA: Diagnosis not present

## 2019-08-15 DIAGNOSIS — Z794 Long term (current) use of insulin: Secondary | ICD-10-CM | POA: Diagnosis not present

## 2019-08-15 DIAGNOSIS — Z6841 Body Mass Index (BMI) 40.0 and over, adult: Secondary | ICD-10-CM | POA: Diagnosis not present

## 2019-08-15 DIAGNOSIS — R0902 Hypoxemia: Secondary | ICD-10-CM | POA: Diagnosis not present

## 2019-08-15 DIAGNOSIS — M549 Dorsalgia, unspecified: Secondary | ICD-10-CM

## 2019-08-15 DIAGNOSIS — N186 End stage renal disease: Secondary | ICD-10-CM

## 2019-08-15 DIAGNOSIS — G4733 Obstructive sleep apnea (adult) (pediatric): Secondary | ICD-10-CM

## 2019-08-15 DIAGNOSIS — Z9989 Dependence on other enabling machines and devices: Secondary | ICD-10-CM

## 2019-08-15 LAB — DRUG SCREEN 10 W/CONF, WB

## 2019-08-15 LAB — GLUCOSE, CAPILLARY: Glucose-Capillary: 125 mg/dL — ABNORMAL HIGH (ref 70–99)

## 2019-08-15 LAB — POCT GLYCOSYLATED HEMOGLOBIN (HGB A1C): Hemoglobin A1C: 7 % — AB (ref 4.0–5.6)

## 2019-08-15 NOTE — Telephone Encounter (Signed)
Order for [phone] placed in epic.Despina Hidden Cassady11/5/20202:23 PM

## 2019-08-15 NOTE — Telephone Encounter (Signed)
Goes to [facility] on [address], spoke w/ staff, they have not and will not do a drug screen on pt, I ask specifically having a dr's order and they said no.

## 2019-08-15 NOTE — Progress Notes (Signed)
SATURATION QUALIFICATIONS: (This note is used to comply with regulatory documentation for home oxygen)  Patient Saturations on Room Air at Rest = 86-94-%  Patient Saturations on Room Air while Ambulating = 76%  Patient Saturations on 2 Liters of oxygen while Ambulating = 96%  Please briefly explain why patient needs home oxygen: Patient noted to be hypoxic on arrival, 86% on RA. After resting, oxygen improved to 94%. Ambulatory pulse ox was obtained and patient desaturated to 76% after minimal exertion, only a few steps. We highly recommended patient let us admit him to the hospital for oxygen and further work up, but he refused. He was placed on oxygen here and required 2 L [place] to maintain saturation 96% with exertion.

## 2019-08-15 NOTE — Telephone Encounter (Signed)
Have now rec'd a call from [facility], [place], she states the blood was drawn using the wrong tube.  I called dialysis center back and spoke to dana, she stated that the person I spoke to was wrong and didn't know that it could be done, that they could do another on Monday but need a new requisition, spoke w/ [facility] and she states to send  LAB 2880 FUTURE LAB COLLECT [facility]  Hinton Dyer is calling [facility] to verify which tube will be correct, gave her the ph# and [place] to talk to

## 2019-08-15 NOTE — Progress Notes (Signed)
Internal Medicine Clinic Attending  I saw and evaluated the patient.  I personally confirmed the key portions of the history and exam documented by Dr. Darrick Meigs and I reviewed pertinent patient test results.  The assessment, diagnosis, and plan were formulated together and I agree with the documentation in the resident's note.    Patient noted to be hypoxic on arrival, 86% on RA. After resting, oxygen improved to 94%. Ambulatory pulse ox was obtained and patient desaturated to 76% after minimal exertion, only a few steps. We highly recommended patient let us admit him to the hospital for oxygen and further work up, but he refused. He was placed on oxygen here and required 2 L [place] to maintain saturation 96% with exertion.    He is ESRD on HD M/W/F. Reports he completed a full session yesterday. Unsure what is dry weight is, but he is up 12 lbs from three months ago. Breath sounds are difficult to auscultate due to his morbid obesity. He is due for dialysis tomorrow. Will arrange for home DME oxygen but this will take a few days to arrange. He will need close follow up, 1 week.   Unrelated, patient has chronic pain previously receiving chronic opioids from our clinic. Unfortunately his last blood tox screen was inappropriately negative. He repeatedly refuses lab draws in clinic because he is a "hard stick". Today he smells strongly of marijuana. Family has previously reported concern of him selling his prescriptions. Home health also fired patient due to drug paraphernalia in the house. We will no long be providing patient with opioids. We can refer him to a pain clinic if needed.

## 2019-08-15 NOTE — Patient Instructions (Addendum)
It was a pleasure seeing you again. I will place a referral to the pain clinic to manage your back and knee pain. I will also recommend that you try some Lubriderm lotion on your hands.

## 2019-08-15 NOTE — Progress Notes (Signed)
Lab orders faxed to [name] at [phone] 08-15-2019 at 16:19  [facility] requisition for Drug Screen, whole blood and [facility] test requirements included  Maryan Rued, PBT 08-15-2019 16:20

## 2019-08-16 ENCOUNTER — Telehealth: Payer: Self-pay | Admitting: *Deleted

## 2019-08-16 DIAGNOSIS — R0902 Hypoxemia: Secondary | ICD-10-CM | POA: Insufficient documentation

## 2019-08-16 DIAGNOSIS — L309 Dermatitis, unspecified: Secondary | ICD-10-CM | POA: Insufficient documentation

## 2019-08-16 DIAGNOSIS — F1190 Opioid use, unspecified, uncomplicated: Secondary | ICD-10-CM | POA: Insufficient documentation

## 2019-08-16 NOTE — Telephone Encounter (Signed)
Call from pt - stated he saw his doctor yesterday and was told pain medication will be prescribed. I read info from [facility] . "I will place a referral to the pain clinic to manage you back and knee pain. I will also recommend that you try some Lubriderm lotion on your hands." Stated he knew about the pain center referral which he's agreeable but also was told pain med will be ordered.Told him I will send his request to his doctor.

## 2019-08-16 NOTE — Telephone Encounter (Signed)
Community message sent to [employer] regarding order for home oxygen.Despina Hidden Cassady11/6/202012:24 PM

## 2019-08-16 NOTE — Assessment & Plan Note (Signed)
Patient notes 64mo hx of itchy dry skin involving dorsal aspect of left hand and wrist. Pruritic in nature. Has been trying Vaseline with no relief.  Plan: lubriderm application multiple times per day. Encouraged to return if sx do not resolve at which time we can try a steroid cream.

## 2019-08-16 NOTE — Assessment & Plan Note (Signed)
Medications: tresiba 14U daily, novolog TID, ozempic 0.25mg  once a week A1C today 7.0. Down from prior.  Plan: continue current management. DM recheck in 64mo.

## 2019-08-16 NOTE — Assessment & Plan Note (Addendum)
Patient with a pmh of OSA and obesity related hypoventilation syndrome. Noted to be sating at 86% RA at rest which increased to 96% spontaneously. Ambulatory O2 sats ~75% with minimal exertion. Patient encouraged to allow admittance into hospital for further evaluation however he declined. Script sent for 2L [place] supplemental O2 at home.He was strongly encouraged to go to ED if he becomes symptomatic.

## 2019-08-16 NOTE — Assessment & Plan Note (Signed)
Patient stating he needs new mask and tubing for CPAP. He notes he recently got his CPAP recertified last month. Uses it every night. DME orders sent

## 2019-08-16 NOTE — Telephone Encounter (Signed)
Thank you for passing on. Will call him later today.

## 2019-08-16 NOTE — Assessment & Plan Note (Addendum)
Patient presenting for pain medication refill. Chronic dialysis patient--anuric so drug screens must be done via serum. Previous drug screen in March inappropriately negative. Since that time, he has been refusing drug screens at our clinic as he implies that he "is a difficult stick". He was supposed to have obtained a drug screen prior to dialysis in the recent past however this was not obtained.  Additionally, home health had noted drug paraphenelia at their home visit. When he came to the clinic today, the room smelled strongly of marijuana.  Finally, he was hypoxicin clinic today in the setting of  OSA and OAH. Overall, I do not find it safe to continue prescribing oral narcotics at this time. We discussed this in clinic and then on the phone. He expressed understanding. Referral placed to pain clinic as he may benefit from further pain treatment there. Continue conservative treatment in the mean time. He states that he has been out of narcotics for the past month so it is unlikely that he would go through opioid withdrawal.

## 2019-08-16 NOTE — Progress Notes (Signed)
   CC: chronic pain, rash  HPI:  [name] is a 53 y.o. evaluation of left arm rash and chronic pain. Please see problem based assessment and plan for additional details.     Past Medical History:  Diagnosis Date  . Acute on chronic respiratory failure with hypoxia and hypercapnia ([place]) 05/29/2019  . Diabetes mellitus    INSULIN DEPENDENT DIABETES  . Dialysis patient ([place])   . 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 ESRD (end stage renal disease) on dialysis [facility])    [facility] M/W/F  . Hypertension   . 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 Sleep apnea    USES CPAP  . Type 2 diabetes mellitus ([name])     Review of Systems:  Review of Systems - General ROS: negative for - chills or fever Respiratory ROS: no cough, shortness of breath, or wheezing Cardiovascular ROS: no chest pain or dyspnea on exertion Gastrointestinal ROS: no abdominal pain, change in bowel habits, or black or bloody stools Musculoskeletal ROS: positive for - joint pain  Neuro: no LOC Psych: no depression   Physical Exam:  Vitals:   08/15/19 1440 08/15/19 1446  BP: (!) 177/101 128/60  Pulse: 84 92  Temp: 98.1 F (36.7 C)   TempSrc: Oral   SpO2: (!) 86% 94%  Weight: (!) 321 lb 8 oz (145.8 kg)     GENERAL: chronically ill appearing, in no apparent distress. Room smelled heavily of marijuana. CARDIAC: difficult to auscultate due to body habitus but regular rate and rhythm faintly auscultated PULMONARY: in no respiratory distress. difficult to auscultate due to body habitus however no external auditory wheezes SKIN: dry skin over the left dorsal hand and wrist NEURO: a/o Musculoskeletal: sitting in wheelchair. Was only able to ambulate a few feet before desaturating.     Assessment & Plan:   See Encounters Tab for problem based charting.  Patient seen with Dr. Laray Anger, MD Internal Medicine Resident-PGY1 08/16/19

## 2019-08-18 ENCOUNTER — Non-Acute Institutional Stay (HOSPITAL_COMMUNITY)
Admission: EM | Admit: 2019-08-18 | Discharge: 2019-08-19 | Disposition: A | Discharge status: Home / Self Care | Payer: Medicare Other | Attending: Emergency Medicine | Admitting: Emergency Medicine

## 2019-08-18 ENCOUNTER — Other Ambulatory Visit: Payer: Self-pay

## 2019-08-18 ENCOUNTER — Encounter (HOSPITAL_COMMUNITY): Payer: Self-pay

## 2019-08-18 ENCOUNTER — Emergency Department (HOSPITAL_COMMUNITY): Payer: Medicare Other

## 2019-08-18 ENCOUNTER — Emergency Department (HOSPITAL_COMMUNITY)
Admission: EM | Admit: 2019-08-18 | Discharge: 2019-08-18 | Disposition: A | Discharge status: Home / Self Care | Payer: No Typology Code available for payment source | Attending: Emergency Medicine | Admitting: Emergency Medicine

## 2019-08-18 ENCOUNTER — Encounter (HOSPITAL_COMMUNITY): Payer: Self-pay | Admitting: Emergency Medicine

## 2019-08-18 DIAGNOSIS — R059 Cough, unspecified: Secondary | ICD-10-CM

## 2019-08-18 DIAGNOSIS — M545 Low back pain, unspecified: Secondary | ICD-10-CM

## 2019-08-18 DIAGNOSIS — Z7982 Long term (current) use of aspirin: Secondary | ICD-10-CM | POA: Diagnosis not present

## 2019-08-18 DIAGNOSIS — N186 End stage renal disease: Secondary | ICD-10-CM | POA: Insufficient documentation

## 2019-08-18 DIAGNOSIS — E1122 Type 2 diabetes mellitus with diabetic chronic kidney disease: Secondary | ICD-10-CM | POA: Insufficient documentation

## 2019-08-18 DIAGNOSIS — R110 Nausea: Secondary | ICD-10-CM | POA: Diagnosis not present

## 2019-08-18 DIAGNOSIS — R0689 Other abnormalities of breathing: Secondary | ICD-10-CM | POA: Diagnosis not present

## 2019-08-18 DIAGNOSIS — Z794 Long term (current) use of insulin: Secondary | ICD-10-CM | POA: Diagnosis not present

## 2019-08-18 DIAGNOSIS — Z20828 Contact with and (suspected) exposure to other viral communicable diseases: Secondary | ICD-10-CM | POA: Diagnosis not present

## 2019-08-18 DIAGNOSIS — Z79899 Other long term (current) drug therapy: Secondary | ICD-10-CM | POA: Insufficient documentation

## 2019-08-18 DIAGNOSIS — Z791 Long term (current) use of non-steroidal anti-inflammatories (NSAID): Secondary | ICD-10-CM | POA: Insufficient documentation

## 2019-08-18 DIAGNOSIS — N2581 Secondary hyperparathyroidism of renal origin: Secondary | ICD-10-CM | POA: Insufficient documentation

## 2019-08-18 DIAGNOSIS — G8929 Other chronic pain: Secondary | ICD-10-CM

## 2019-08-18 DIAGNOSIS — F419 Anxiety disorder, unspecified: Secondary | ICD-10-CM | POA: Insufficient documentation

## 2019-08-18 DIAGNOSIS — I120 Hypertensive chronic kidney disease with stage 5 chronic kidney disease or end stage renal disease: Secondary | ICD-10-CM | POA: Diagnosis not present

## 2019-08-18 DIAGNOSIS — R0602 Shortness of breath: Secondary | ICD-10-CM

## 2019-08-18 DIAGNOSIS — Z992 Dependence on renal dialysis: Secondary | ICD-10-CM | POA: Insufficient documentation

## 2019-08-18 DIAGNOSIS — Z9981 Dependence on supplemental oxygen: Secondary | ICD-10-CM | POA: Diagnosis not present

## 2019-08-18 DIAGNOSIS — R05 Cough: Secondary | ICD-10-CM | POA: Insufficient documentation

## 2019-08-18 DIAGNOSIS — E875 Hyperkalemia: Secondary | ICD-10-CM | POA: Insufficient documentation

## 2019-08-18 DIAGNOSIS — Z789 Other specified health status: Secondary | ICD-10-CM

## 2019-08-18 DIAGNOSIS — E662 Morbid (severe) obesity with alveolar hypoventilation: Secondary | ICD-10-CM | POA: Insufficient documentation

## 2019-08-18 DIAGNOSIS — I252 Old myocardial infarction: Secondary | ICD-10-CM | POA: Insufficient documentation

## 2019-08-18 LAB — COMPREHENSIVE METABOLIC PANEL
ALT: 9 U/L (ref 0–44)
AST: 11 U/L — ABNORMAL LOW (ref 15–41)
Albumin: 3.9 g/dL (ref 3.5–5.0)
Alkaline Phosphatase: 140 U/L — ABNORMAL HIGH (ref 38–126)
Anion gap: 19 — ABNORMAL HIGH (ref 5–15)
BUN: 91 mg/dL — ABNORMAL HIGH (ref 6–20)
CO2: 22 mmol/L (ref 22–32)
Calcium: 6.4 mg/dL — CL (ref 8.9–10.3)
Chloride: 93 mmol/L — ABNORMAL LOW (ref 98–111)
Creatinine, Ser: 14.51 mg/dL — ABNORMAL HIGH (ref 0.61–1.24)
GFR calc Af Amer: 4 mL/min — ABNORMAL LOW (ref >60)
GFR calc non Af Amer: 3 mL/min — ABNORMAL LOW (ref >60)
Glucose, Bld: 138 mg/dL — ABNORMAL HIGH (ref 70–99)
Potassium: 6.2 mmol/L — ABNORMAL HIGH (ref 3.5–5.1)
Sodium: 134 mmol/L — ABNORMAL LOW (ref 135–145)
Total Bilirubin: 0.6 mg/dL (ref 0.3–1.2)
Total Protein: 9.4 g/dL — ABNORMAL HIGH (ref 6.5–8.1)

## 2019-08-18 LAB — CBC
HCT: 39.2 % (ref 39.0–52.0)
Hemoglobin: 11.5 g/dL — ABNORMAL LOW (ref 13.0–17.0)
MCH: 30.4 pg (ref 26.0–34.0)
MCHC: 29.3 g/dL — ABNORMAL LOW (ref 30.0–36.0)
MCV: 103.7 fL — ABNORMAL HIGH (ref 80.0–100.0)
Platelets: 219 10*3/uL (ref 150–400)
RBC: 3.78 MIL/uL — ABNORMAL LOW (ref 4.22–5.81)
RDW: 17.9 % — ABNORMAL HIGH (ref 11.5–15.5)
WBC: 12.7 10*3/uL — ABNORMAL HIGH (ref 4.0–10.5)
nRBC: 0.5 % — ABNORMAL HIGH (ref 0.0–0.2)

## 2019-08-18 LAB — TROPONIN I (HIGH SENSITIVITY): Troponin I (High Sensitivity): 12 ng/L (ref <18)

## 2019-08-18 LAB — BRAIN NATRIURETIC PEPTIDE: B Natriuretic Peptide: 160.7 pg/mL — ABNORMAL HIGH (ref 0.0–100.0)

## 2019-08-18 LAB — LIPASE, BLOOD: Lipase: 85 U/L — ABNORMAL HIGH (ref 11–51)

## 2019-08-18 MED ORDER — SODIUM CHLORIDE 0.9% FLUSH: 3.0000 mL | Freq: Once | INTRAVENOUS | Status: DC

## 2019-08-18 NOTE — ED Notes (Signed)
Pt states he was put on oxygen recently from his doctor, and wanted to come in to make sure he is using it correctly. Pt states he has no complaints at this time, just the check up.

## 2019-08-18 NOTE — ED Triage Notes (Signed)
Pt c/o back and neck pain, pt states he is out of his pain meds due to starting a pain management clinic next week. Pt also states he is "feeling funny" after getting started on home 02, shaky and "stomach feeling funny". HD MWF, last full tx Friday.

## 2019-08-18 NOTE — ED Triage Notes (Signed)
Patient states after routine doctors office visit last week it was determined that he needs oxygen for home use with exertion. Developed nausea during the night. No vomiting. Patient alert and no distress

## 2019-08-18 NOTE — ED Provider Notes (Signed)
[facility] EMERGENCY DEPARTMENT Provider Note   CSN: [id number] Arrival date & time: 08/18/19  1123     History   Chief Complaint No chief complaint on file. Hypoxia  HPI Seth [name] is a 53 y.o. male.     The history is provided by the patient and medical records. No language interpreter was used.  Illness Location:  Resolved shortness of breath Severity:  Mild Onset quality:  Gradual Timing:  Unable to specify Progression:  Resolved Chronicity:  New Associated symptoms: no abdominal pain, no chest pain, no congestion, no cough, no diarrhea, no fatigue, no fever, no headaches, no nausea, no rash, no rhinorrhea, no shortness of breath (resolved now), no vomiting and no wheezing     Past Medical History:  Diagnosis Date  . Acute on chronic respiratory failure with hypoxia and hypercapnia ([place]) 05/29/2019  . Diabetes mellitus    INSULIN DEPENDENT DIABETES  . Dialysis patient ([place])   . 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 ESRD (end stage renal disease) on dialysis [facility])    [facility] M/W/F  . Hypertension   . 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 Sleep apnea    USES CPAP  . Type 2 diabetes mellitus [facility])     Patient Active Problem List   Diagnosis Date Noted  . Hypoxia 08/16/2019  . Eczema 08/16/2019  . Chronic, continuous use of opioids 08/16/2019  . [place] 04/30/2019  . NSTEMI (non-ST elevated myocardial infarction) ([place]) 11/29/2018  . Hyponatremia 11/29/2018  . Hyperkalemia 09/03/2018  . Nightmares 12/28/2017  . Preventative health care 10/01/2015  . 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 Controlled type 2 diabetes mellitus with chronic kidney disease on chronic dialysis ([place]) 07/20/2015  . Absolute anemia   . OSA (obstructive sleep apnea) 10/14/2013  . End stage renal disease on dialysis ([place]) 04/30/2012  . Hyperlipidemia associated with type 2 diabetes mellitus ([place])  06/20/2007  . Obesity hypoventilation syndrome ([place]) 06/20/2007  . Hypertension associated with diabetes ([place]) 06/20/2007    Past Surgical History:  Procedure Laterality Date  . A/V FISTULAGRAM N/A 10/18/2018   Procedure: A/V FISTULAGRAM;  Surgeon: Marty Heck, MD;  Location: [place] [facility];  Service: Cardiovascular;  Laterality: N/A;  . A/V FISTULAGRAM Left 03/28/2019   Procedure: A/V Fistulagram;  Surgeon: Marty Heck, MD;  Location: [place] [facility];  Service: Cardiovascular;  Laterality: Left;  . AV FISTULA PLACEMENT  05/03/2012   Procedure: ARTERIOVENOUS (AV) FISTULA CREATION;  Surgeon: Mal [name], MD;  Location: [place];  Service: Vascular;  Laterality: Right;  . AV FISTULA PLACEMENT Left 10/23/2018   Procedure: ARTERIOVENOUS (AV) FISTULA CREATION;  Surgeon: Waynetta Sandy, MD;  Location: [place];  Service: Vascular;  Laterality: Left;  . BASCILIC VEIN TRANSPOSITION Left 04/02/2019   Procedure: BASILIC VEIN TRANSPOSITION SECOND STAGE LEFT;  Surgeon: Waynetta Sandy, MD;  Location: [place];  Service: Vascular;  Laterality: Left;  . INSERTION OF DIALYSIS CATHETER Left 12/03/2018   Procedure: INSERTION OF DIALYSIS CATHETER;  Surgeon: Marty Heck, MD;  Location: [place];  Service: Vascular;  Laterality: Left;  . INSERTION OF DIALYSIS CATHETER Left 06/03/2019   Procedure: INSERTION OF DIALYSIS CATHETER;  Surgeon: Marty Heck, MD;  Location: [place];  Service: Vascular;  Laterality: Left;  . ORIF MANDIBULAR FRACTURE N/A 11/10/2017   Procedure: OPEN REDUCTION INTERNAL FIXATION (ORIF) MANDIBULAR FRACTURE POSSIBLE MAXILLARY MANDIBULAR FIXATION;  Surgeon: Jodi Marble, MD;  Location: [place];  Service: ENT;  Laterality: N/A;  . REVISON OF ARTERIOVENOUS FISTULA Right 05/31/2018   Procedure: REVISION OF ARTERIOVENOUS FISTULA  RIGHT ARM WITH INTERPOSITION ARTEGRAFT 6MM X 16CM GRAFT;  Surgeon: Waynetta Sandy, MD;  Location: [place];  Service:  Vascular;  Laterality: Right;  . TEE WITHOUT CARDIOVERSION N/A 07/10/2015   Procedure: TRANSESOPHAGEAL ECHOCARDIOGRAM (TEE);  Surgeon: Josue Hector, MD;  Location: [place];  Service: Cardiovascular;  Laterality: N/A;  . TEE WITHOUT CARDIOVERSION N/A 12/07/2018   Procedure: TRANSESOPHAGEAL ECHOCARDIOGRAM (TEE);  Surgeon: Acie Fredrickson Wonda Cheng, MD;  Location: [place];  Service: Cardiovascular;  Laterality: N/A;  . TEE WITHOUT CARDIOVERSION N/A 06/03/2019   Procedure: Transesophageal Echocardiogram (Tee);  Surgeon: Acie Fredrickson Wonda Cheng, MD;  Location: [place];  Service: Cardiovascular;  Laterality: N/A;  . tracheostomy removal    . TRACHEOSTOMY TUBE PLACEMENT N/A 08/20/2013   Procedure: TRACHEOSTOMY Revision;  Surgeon: Melida Quitter, MD;  Location: [place];  Service: ENT;  Laterality: N/A;  . UPPER EXTREMITY VENOGRAPHY Bilateral 03/28/2019   Procedure: UPPER EXTREMITY VENOGRAPHY;  Surgeon: Marty Heck, MD;  Location: [place] [facility];  Service: Cardiovascular;  Laterality: Bilateral;        Home Medications    Prior to Admission medications   Medication Sig Start Date End Date Taking? Authorizing Provider  amLODipine (NORVASC) 10 MG tablet Take 1 tablet (10 mg total) by mouth daily. 01/23/19   Annia Belt, MD  aspirin EC 81 MG tablet Take 81 mg by mouth daily.    [provider]  atorvastatin (LIPITOR) 40 MG tablet Take 40 mg by mouth daily.    [provider]  diclofenac sodium (VOLTAREN) 1 % GEL Apply 4 g topically 4 (four) times daily.    [provider]  ferric citrate (AURYXIA) 1 GM 210 MG(Fe) tablet Take 420-630 mg by mouth See admin instructions. Take 3 tablets (630 mg) by mouth with meals and 2 tablets (420 mg) with snacks    [provider]  insulin aspart (NOVOLOG FLEXPEN) 100 UNIT/ML FlexPen Inject 0-20 Units into the skin 3 (three) times daily with meals.    [provider]  insulin degludec (TRESIBA FLEXTOUCH) 100 UNIT/ML  SOPN FlexTouch Pen Inject 0.14 mLs (14 Units total) into the skin daily. 05/13/19   Mitzi Hansen, MD  Semaglutide,0.25 or 0.5MG /DOS, (OZEMPIC, 0.25 OR 0.5 MG/DOSE,) 2 MG/1.5ML SOPN Inject 0.25 mg into the skin once a week. 06/20/19   Forde Dandy, PharmD  senna (SENOKOT) 8.6 MG tablet Take 1 tablet by mouth daily.    [provider]    Family History Family History  Problem Relation Age of Onset  . Diabetes Mother   . Cancer Mother   . Diabetes Sister   . Diabetes Brother     Social History Social History   Tobacco Use  . Smoking status: Never Smoker  . Smokeless tobacco: Never Used  Substance Use Topics  . Alcohol use: No    Frequency: Never  . Drug use: Not Currently    Types: Marijuana     Allergies   Patient has no known allergies.   Review of Systems Review of Systems  Constitutional: Negative for chills, diaphoresis, fatigue and fever.  HENT: Negative for congestion and rhinorrhea.   Eyes: Negative for visual disturbance.  Respiratory: Negative for cough, chest tightness, shortness of breath (resolved now) and wheezing.   Cardiovascular: Negative for chest pain.  Gastrointestinal: Negative for  abdominal pain, diarrhea, nausea and vomiting.  Genitourinary: Negative for flank pain.  Musculoskeletal: Negative for back pain and neck pain.  Skin: Negative for rash and wound.  Neurological: Negative for dizziness, light-headedness and headaches.  Psychiatric/Behavioral: Negative for agitation.  All other systems reviewed and are negative.    Physical Exam Updated Vital Signs BP (!) 146/70   Pulse 80   Temp 99 F (37.2 C) (Oral)   Resp 18   SpO2 100%   Physical Exam Vitals signs and nursing note reviewed.  Constitutional:      General: He is not in acute distress.    Appearance: He is well-developed. He is obese. He is not ill-appearing, toxic-appearing or diaphoretic.  HENT:     Head: Normocephalic and atraumatic.     Nose: Nose normal. No  congestion or rhinorrhea.     Mouth/Throat:     Mouth: Mucous membranes are moist.     Pharynx: No oropharyngeal exudate or posterior oropharyngeal erythema.  Eyes:     Conjunctiva/sclera: Conjunctivae normal.     Pupils: Pupils are equal, round, and reactive to light.  Neck:     Musculoskeletal: Neck supple. No muscular tenderness.  Cardiovascular:     Rate and Rhythm: Normal rate and regular rhythm.     Pulses: Normal pulses.     Heart sounds: No murmur.  Pulmonary:     Effort: Pulmonary effort is normal. No respiratory distress.     Breath sounds: Normal breath sounds. No wheezing, rhonchi or rales.  Abdominal:     General: Abdomen is flat. There is no distension.     Palpations: Abdomen is soft.     Tenderness: There is no abdominal tenderness.  Musculoskeletal:        General: No tenderness.  Skin:    General: Skin is warm and dry.     Capillary Refill: Capillary refill takes less than 2 seconds.     Findings: No erythema or rash.  Neurological:     General: No focal deficit present.     Mental Status: He is alert and oriented to person, place, and time.      ED Treatments / Results  Labs (all labs ordered are listed, but only abnormal results are displayed) Labs Reviewed - No data to display  EKG None  Radiology No results found.  Procedures Procedures (including critical care time)  Medications Ordered in ED Medications - No data to display   Initial Impression / Assessment and Plan / ED Course  I have reviewed the triage vital signs and the nursing notes.  Pertinent labs & imaging results that were available during my care of the patient were reviewed by me and considered in my medical decision making (see chart for details).        Seth [name] is a 53 y.o. male with a past medical history significant for ESRD on dialysis MWF, hypertension, hyperlipidemia, obesity, prior NSTEMI, and recent diagnosis of hypoxia now on home oxygen who presents  for oxygen evaluation.  Patient reports that he was started on oxygen several days ago and wanted to be evaluated to make sure it was "still working ".  He reports no new shortness breath, chest pain, palpitations.  He actually reports feeling much better now that he is on oxygen at home.  He wanted to make sure that his oxygen saturations were not low.  He has a pulse oximeter at home which he reports is showing his oxygen is normal when he  is on the oxygen and low when he is off of it.  Chart view shows that he was offered further work-up and admission for the hypoxia but he did not want that.  On my exam, patient's lungs are cleared, chest and abdomen are nontender.  Patient is on his home oxygen equipment and his oxygen saturations are normal.  Patient otherwise appears well and has minimal edema.  Patient was offered further work-up including labs, chest x-ray, and further evaluation however he would rather go home to follow-up with his outpatient PCP.  He reports he just wanted to make sure his oxygen was normal which does appear to be.  Patient understands the risks of missing things like infection, blood clot, or pulmonary edema causing low oxygen but he does not want further work-up.  He understands these risks and wants to be discharged.  Given oxygen saturations appearing normal and he is demonstrated he knows how to use oxygen supplementation devices, patient will be discharged home.  Patient understands plan of care and follow-up instructions.  Patient discharged in good condition.   Final Clinical Impressions(s) / ED Diagnoses   Final diagnoses:  On supplemental oxygen by nasal cannula    ED Discharge Orders    None      Clinical Impression: 1. On supplemental oxygen by nasal cannula     Disposition: Discharge  Condition: Good  I have discussed the results, Dx and Tx plan with the pt(& family if present). He/she/they expressed understanding and agree(s) with the plan.  Discharge instructions discussed at great length. Strict return precautions discussed and pt &/or family have verbalized understanding of the instructions. No further questions at time of discharge.    New Prescriptions   No medications on file    Follow Up: [facility] [address] [place] [phone] Schedule an appointment as soon as possible for a visit    [facility] [address] [id number] [place] [phone]       Tegeler, Gwenyth Allegra, MD 08/18/19 1329

## 2019-08-18 NOTE — Discharge Instructions (Signed)
Based on your history and exam, it does not appear that you have any new acute abnormalities.  We offered work-up for the previous hypoxia however given your lack of new symptoms, you did not want further work-up.  Your oxygen appears to be working correctly and your oxygen saturations were reassuring in the emergency department.  Please continue to use the oxygen and monitor your pulse oximetry at home.  Please follow-up with your primary doctor.  If any symptoms change or worsen, please return to nearest emergency department.

## 2019-08-19 LAB — RENAL FUNCTION PANEL
Albumin: 4 g/dL (ref 3.5–5.0)
Anion gap: 15 (ref 5–15)
BUN: 52 mg/dL — ABNORMAL HIGH (ref 6–20)
CO2: 25 mmol/L (ref 22–32)
Calcium: 7.8 mg/dL — ABNORMAL LOW (ref 8.9–10.3)
Chloride: 94 mmol/L — ABNORMAL LOW (ref 98–111)
Creatinine, Ser: 10.14 mg/dL — ABNORMAL HIGH (ref 0.61–1.24)
GFR calc Af Amer: 6 mL/min — ABNORMAL LOW (ref >60)
GFR calc non Af Amer: 5 mL/min — ABNORMAL LOW (ref >60)
Glucose, Bld: 90 mg/dL (ref 70–99)
Phosphorus: 6.8 mg/dL — ABNORMAL HIGH (ref 2.5–4.6)
Potassium: 5.1 mmol/L (ref 3.5–5.1)
Sodium: 134 mmol/L — ABNORMAL LOW (ref 135–145)

## 2019-08-19 LAB — POCT I-STAT 7, (LYTES, BLD GAS, ICA,H+H)
Acid-base deficit: 7 mmol/L — ABNORMAL HIGH (ref 0.0–2.0)
Bicarbonate: 23.9 mmol/L (ref 20.0–28.0)
Calcium, Ion: 0.87 mmol/L — CL (ref 1.15–1.40)
HCT: 39 % (ref 39.0–52.0)
Hemoglobin: 13.3 g/dL (ref 13.0–17.0)
O2 Saturation: 88 %
Patient temperature: 97.8
Potassium: 6.9 mmol/L (ref 3.5–5.1)
Sodium: 132 mmol/L — ABNORMAL LOW (ref 135–145)
TCO2: 26 mmol/L (ref 22–32)
pCO2 arterial: 76.4 mmHg (ref 32.0–48.0)
pH, Arterial: 7.101 — CL (ref 7.350–7.450)
pO2, Arterial: 74 mmHg — ABNORMAL LOW (ref 83.0–108.0)

## 2019-08-19 LAB — POCT I-STAT EG7
Acid-base deficit: 6 mmol/L — ABNORMAL HIGH (ref 0.0–2.0)
Bicarbonate: 23.2 mmol/L (ref 20.0–28.0)
Calcium, Ion: 0.78 mmol/L — CL (ref 1.15–1.40)
HCT: 39 % (ref 39.0–52.0)
Hemoglobin: 13.3 g/dL (ref 13.0–17.0)
O2 Saturation: 82 %
Potassium: 7.6 mmol/L (ref 3.5–5.1)
Sodium: 131 mmol/L — ABNORMAL LOW (ref 135–145)
TCO2: 25 mmol/L (ref 22–32)
pCO2, Ven: 62.3 mmHg — ABNORMAL HIGH (ref 44.0–60.0)
pH, Ven: 7.178 — CL (ref 7.250–7.430)
pO2, Ven: 59 mmHg — ABNORMAL HIGH (ref 32.0–45.0)

## 2019-08-19 LAB — POTASSIUM
Potassium: >7.5 mmol/L (ref 3.5–5.1)
Potassium: >7.5 mmol/L (ref 3.5–5.1)

## 2019-08-19 LAB — SARS CORONAVIRUS 2 (TAT 6-24 HRS): SARS Coronavirus 2: NEGATIVE

## 2019-08-19 MED ORDER — HEPARIN SODIUM (PORCINE) 1000 UNIT/ML IJ SOLN
INTRAMUSCULAR | Status: AC
  Administered 2019-08-19: 19:00:00 3800 [IU] via INTRAVENOUS_CENTRAL
  Filled 2019-08-19: qty 4

## 2019-08-19 MED ORDER — CALCIUM GLUCONATE-NACL 1-0.675 GM/50ML-% IV SOLN
1.0000 g | Freq: Once | INTRAVENOUS | Status: AC
  Administered 2019-08-19: 1000 mg via INTRAVENOUS
  Filled 2019-08-19: qty 50

## 2019-08-19 MED ORDER — VANCOMYCIN HCL IN DEXTROSE 1-5 GM/200ML-% IV SOLN: 1000.0000 mg | INTRAVENOUS | Status: DC

## 2019-08-19 MED ORDER — SODIUM ZIRCONIUM CYCLOSILICATE 10 G PO PACK
10.0000 g | PACK | Freq: Once | ORAL | Status: AC
  Administered 2019-08-19: 11:00:00 10 g via ORAL
  Filled 2019-08-19: qty 1

## 2019-08-19 MED ORDER — SODIUM CHLORIDE 0.9 % IV SOLN: 1.0000 g | Freq: Once | INTRAVENOUS | Status: DC

## 2019-08-19 MED ORDER — DEXTROSE 50 % IV SOLN
1.0000 | Freq: Once | INTRAVENOUS | Status: AC
  Administered 2019-08-19: 12:00:00 50 mL via INTRAVENOUS
  Filled 2019-08-19: qty 50

## 2019-08-19 MED ORDER — CHLORHEXIDINE GLUCONATE CLOTH 2 % EX PADS: 6.0000 | MEDICATED_PAD | Freq: Every day | CUTANEOUS | Status: DC

## 2019-08-19 MED ORDER — INSULIN ASPART 100 UNIT/ML IV SOLN
5.0000 [IU] | Freq: Once | INTRAVENOUS | Status: AC
  Administered 2019-08-19: 5 [IU] via INTRAVENOUS

## 2019-08-19 MED ORDER — DEXTROSE 10 % IV SOLN: INTRAVENOUS | Status: AC

## 2019-08-19 MED ORDER — VANCOMYCIN HCL 10 G IV SOLR
2250.0000 mg | Freq: Once | INTRAVENOUS | Status: AC
  Administered 2019-08-19: 16:00:00 2250 mg via INTRAVENOUS
  Filled 2019-08-19: qty 2000

## 2019-08-19 MED ORDER — FERRIC CITRATE 1 GM 210 MG(FE) PO TABS
630.0000 mg | ORAL_TABLET | Freq: Three times a day (TID) | ORAL | Status: DC
  Filled 2019-08-19 (×2): qty 3

## 2019-08-19 MED ORDER — DOXERCALCIFEROL 0.5 MCG PO CAPS
1.5000 ug | ORAL_CAPSULE | ORAL | Status: DC
  Filled 2019-08-19: qty 3

## 2019-08-19 MED ORDER — HEPARIN SODIUM (PORCINE) 1000 UNIT/ML DIALYSIS
4500.0000 [IU] | INTRAMUSCULAR | Status: DC | PRN
  Administered 2019-08-19: 19:00:00 3800 [IU] via INTRAVENOUS_CENTRAL
  Filled 2019-08-19: qty 5

## 2019-08-19 MED ORDER — DOXERCALCIFEROL 4 MCG/2ML IV SOLN
INTRAVENOUS | Status: AC
  Administered 2019-08-19: 19:00:00 1.5 ug
  Filled 2019-08-19: qty 2

## 2019-08-19 NOTE — ED Notes (Signed)
Report called to [facility] in dialysis will transport once covid is complete

## 2019-08-19 NOTE — Progress Notes (Signed)
Abnormal ABG results given to Al Decant, MD no new orders at this time.

## 2019-08-19 NOTE — Procedures (Signed)
   I was present at this dialysis session, have reviewed the session itself and made  appropriate changes Kelly Splinter MD [place] pager [phone]   08/19/2019, 5:36 PM

## 2019-08-19 NOTE — Progress Notes (Signed)
Renal Navigator received call from EDP stating patient is here and has missed OP HD. Navigator has been asked to reschedule OP HD. Patient has been tested for COVID and needs to be treated as a PUI. Per Clinic Manager/Dana at [facility], patient can treat today at [place] notified.  Alphonzo Cruise, Rafael Gonzalez Renal Navigator [phone]

## 2019-08-19 NOTE — ED Notes (Signed)
Critical Pot. Great than 735 reported to DR. Tyrone Nine

## 2019-08-19 NOTE — ED Provider Notes (Signed)
[facility] EMERGENCY DEPARTMENT Provider Note   CSN: [id number] Arrival date & time: 08/18/19  2009     History   Chief Complaint Chief Complaint  Patient presents with   Back Pain    HPI Seth [name] is a 53 y.o. male.     HPI [name] is a 53 year old male with a past medical history of ESRD on HD on Monday Wednesday Friday, type 2 diabetes, obesity hypoventilation disorder with chronic hypercarbia who presents to the emergency room with shortness of breath, cough, nausea and anxiety.  The patient reports the shortness of breath started all of a sudden yesterday.  He developed a dry cough 2 days ago.  His nausea comes and goes and he has not eaten in 2 days.  He feels anxious which is not normal for him and thinks that has perhaps contributed to his shortness of breath and nausea.  He denies fatigue, fever, chills, rhinorrhea, sore throat, chest pain, palpitations, abdominal pain, polydipsia, headache.  Patient does endorse recent onset of rash on his left lower arm which he thinks may be eczema.  He also endorses chronic left lower back pain.  He denies any sick contacts.  Past Medical History:  Diagnosis Date   Acute on chronic respiratory failure with hypoxia and hypercapnia ([place]) 05/29/2019   Diabetes mellitus    INSULIN DEPENDENT DIABETES   Dialysis patient [facility])    Discitis 07/2015   ESRD (end stage renal disease) on dialysis [facility])    [place]  Dialysis M/W/F   Hypertension    RETINAL DETACHMENT, HX OF 06/20/2007   Qualifier: Diagnosis of  By: Vinetta Bergamo RN, [name]     Sleep apnea    USES CPAP   Type 2 diabetes mellitus [facility])     Patient Active Problem List   Diagnosis Date Noted   Hypoxia 08/16/2019   Eczema 08/16/2019   Chronic, continuous use of opioids 08/16/2019   Furuncles 04/30/2019   NSTEMI (non-ST elevated myocardial infarction) ([place]) 11/29/2018   Hyponatremia 11/29/2018   Hyperkalemia 09/03/2018    Nightmares 12/28/2017   Preventative health care 10/01/2015   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   Controlled type 2 diabetes mellitus with chronic kidney disease on chronic dialysis ([place] [address]) 07/20/2015   Absolute anemia    OSA (obstructive sleep apnea) 10/14/2013   End stage renal disease on dialysis ([place]) 04/30/2012   Hyperlipidemia associated with type 2 diabetes mellitus ([place]) 06/20/2007   Obesity hypoventilation syndrome ([place]) 06/20/2007   Hypertension associated with diabetes ([place]) 06/20/2007    Past Surgical History:  Procedure Laterality Date   A/V FISTULAGRAM N/A 10/18/2018   Procedure: A/V FISTULAGRAM;  Surgeon: Marty Heck, MD;  Location: [place] CV LAB;  Service: Cardiovascular;  Laterality: N/A;   A/V FISTULAGRAM Left 03/28/2019   Procedure: A/V Fistulagram;  Surgeon: Marty Heck, MD;  Location: [place] CV [facility];  Service: Cardiovascular;  Laterality: Left;   AV FISTULA PLACEMENT  05/03/2012   Procedure: ARTERIOVENOUS (AV) FISTULA CREATION;  Surgeon: Mal [name], MD;  Location: [place];  Service: Vascular;  Laterality: Right;   AV FISTULA PLACEMENT Left 10/23/2018   Procedure: ARTERIOVENOUS (AV) FISTULA CREATION;  Surgeon: Waynetta Sandy, MD;  Location: [place];  Service: Vascular;  Laterality: Left;   BASCILIC VEIN TRANSPOSITION Left 04/02/2019   Procedure: BASILIC VEIN TRANSPOSITION SECOND STAGE LEFT;  Surgeon: Waynetta Sandy, MD;  Location: [place];  Service: Vascular;  Laterality: Left;   INSERTION OF  DIALYSIS CATHETER Left 12/03/2018   Procedure: INSERTION OF DIALYSIS CATHETER;  Surgeon: Marty Heck, MD;  Location: [place];  Service: Vascular;  Laterality: Left;   INSERTION OF DIALYSIS CATHETER Left 06/03/2019   Procedure: INSERTION OF DIALYSIS CATHETER;  Surgeon: Marty Heck, MD;  Location: [place];  Service: Vascular;  Laterality: Left;   ORIF MANDIBULAR FRACTURE N/A 11/10/2017   Procedure: OPEN  REDUCTION INTERNAL FIXATION (ORIF) MANDIBULAR FRACTURE POSSIBLE MAXILLARY MANDIBULAR FIXATION;  Surgeon: Jodi Marble, MD;  Location: [place];  Service: ENT;  Laterality: N/A;   REVISON OF ARTERIOVENOUS FISTULA Right 05/31/2018   Procedure: REVISION OF ARTERIOVENOUS FISTULA  RIGHT ARM WITH INTERPOSITION ARTEGRAFT 6MM X 16CM GRAFT;  Surgeon: Waynetta Sandy, MD;  Location: [place];  Service: Vascular;  Laterality: Right;   TEE WITHOUT CARDIOVERSION N/A 07/10/2015   Procedure: TRANSESOPHAGEAL ECHOCARDIOGRAM (TEE);  Surgeon: Josue Hector, MD;  Location: [facility] ENDOSCOPY;  Service: Cardiovascular;  Laterality: N/A;   TEE WITHOUT CARDIOVERSION N/A 12/07/2018   Procedure: TRANSESOPHAGEAL ECHOCARDIOGRAM (TEE);  Surgeon: Acie Fredrickson Wonda Cheng, MD;  Location: [place];  Service: Cardiovascular;  Laterality: N/A;   TEE WITHOUT CARDIOVERSION N/A 06/03/2019   Procedure: Transesophageal Echocardiogram (Tee);  Surgeon: Acie Fredrickson Wonda Cheng, MD;  Location: [place];  Service: Cardiovascular;  Laterality: N/A;   tracheostomy removal     TRACHEOSTOMY TUBE PLACEMENT N/A 08/20/2013   Procedure: TRACHEOSTOMY Revision;  Surgeon: Melida Quitter, MD;  Location: [place];  Service: ENT;  Laterality: N/A;   UPPER EXTREMITY VENOGRAPHY Bilateral 03/28/2019   Procedure: UPPER EXTREMITY VENOGRAPHY;  Surgeon: Marty Heck, MD;  Location: [place] [facility];  Service: Cardiovascular;  Laterality: Bilateral;      Home Medications    Prior to Admission medications   Medication Sig Start Date End Date Taking? Authorizing Provider  amLODipine (NORVASC) 10 MG tablet Take 1 tablet (10 mg total) by mouth daily. 01/23/19   Annia Belt, MD  aspirin EC 81 MG tablet Take 81 mg by mouth daily.    [provider]  atorvastatin (LIPITOR) 40 MG tablet Take 40 mg by mouth daily.    [provider]  diclofenac sodium (VOLTAREN) 1 % GEL Apply 4 g topically 4 (four) times daily.    [provider]    ferric citrate (AURYXIA) 1 GM 210 MG(Fe) tablet Take 420-630 mg by mouth See admin instructions. Take 3 tablets (630 mg) by mouth with meals and 2 tablets (420 mg) with snacks    [provider]  insulin aspart (NOVOLOG FLEXPEN) 100 UNIT/ML FlexPen Inject 0-20 Units into the skin 3 (three) times daily with meals.    [provider]  insulin degludec (TRESIBA FLEXTOUCH) 100 UNIT/ML SOPN FlexTouch Pen Inject 0.14 mLs (14 Units total) into the skin daily. 05/13/19   Mitzi Hansen, MD  Semaglutide,0.25 or 0.5MG /DOS, (OZEMPIC, 0.25 OR 0.5 MG/DOSE,) 2 MG/1.5ML SOPN Inject 0.25 mg into the skin once a week. 06/20/19   Forde Dandy, PharmD  senna (SENOKOT) 8.6 MG tablet Take 1 tablet by mouth daily.    [provider]    Family History Family History  Problem Relation Age of Onset   Diabetes Mother    Cancer Mother    Diabetes Sister    Diabetes Brother     Social History Social History   Tobacco Use   Smoking status: Never Smoker   Smokeless tobacco: Never Used  Substance Use Topics   Alcohol use: No    Frequency: Never  Drug use: Not Currently    Types: Marijuana     Allergies   Patient has no known allergies.   Review of Systems Review of Systems  Constitutional: Negative for chills, fatigue and fever.  HENT: Negative for rhinorrhea and sore throat.   Eyes: Negative for visual disturbance.  Respiratory: Positive for cough and shortness of breath.   Cardiovascular: Negative for chest pain, palpitations and leg swelling.  Gastrointestinal: Positive for nausea. Negative for abdominal pain and diarrhea.  Endocrine: Negative for polydipsia.  Genitourinary:       Does not make urine  Musculoskeletal: Positive for back pain (lower back pain).  Skin:       Dry skin on posterior left lower arm  Neurological: Negative for headaches.       No tingling   Psychiatric/Behavioral: The patient is nervous/anxious.      Physical Exam Updated  Vital Signs BP (!) 146/107 (BP Location: Left Leg)    Pulse 82    Temp 97.8 F (36.6 C) (Oral)    Resp (!) 26    Wt (!) 145 kg    SpO2 95%    BMI 49.33 kg/m   Physical Exam Vitals signs and nursing note reviewed.  Constitutional:      General: He is not in acute distress. HENT:     Head: Normocephalic and atraumatic.     Mouth/Throat:     Mouth: Mucous membranes are moist.     Pharynx: No posterior oropharyngeal erythema.  Neck:     Musculoskeletal: Normal range of motion.  Cardiovascular:     Comments: Distant heart sounds Pulmonary:     Effort: Pulmonary effort is normal. No respiratory distress.     Breath sounds: Normal breath sounds.  Abdominal:     General: Bowel sounds are normal.     Palpations: Abdomen is soft.  Skin:    General: Skin is warm and dry.     Comments: Dry patch on posterior left lower arm and dorsum of the left hand  Neurological:     General: No focal deficit present.     Mental Status: He is alert and oriented to person, place, and time.     ED Treatments / Results  Labs (all labs ordered are listed, but only abnormal results are displayed) Labs Reviewed  LIPASE, BLOOD - Abnormal; Notable for the following components:      Result Value   Lipase 85 (*)    All other components within normal limits  COMPREHENSIVE METABOLIC PANEL - Abnormal; Notable for the following components:   Sodium 134 (*)    Potassium 6.2 (*)    Chloride 93 (*)    Glucose, Bld 138 (*)    BUN 91 (*)    Creatinine, Ser 14.51 (*)    Calcium 6.4 (*)    Total Protein 9.4 (*)    AST 11 (*)    Alkaline Phosphatase 140 (*)    GFR calc non Af Amer 3 (*)    GFR calc Af Amer 4 (*)    Anion gap 19 (*)    All other components within normal limits  CBC - Abnormal; Notable for the following components:   WBC 12.7 (*)    RBC 3.78 (*)    Hemoglobin 11.5 (*)    MCV 103.7 (*)    MCHC 29.3 (*)    RDW 17.9 (*)    nRBC 0.5 (*)    All other components within normal limits  BRAIN  NATRIURETIC  PEPTIDE - Abnormal; Notable for the following components:   B Natriuretic Peptide 160.7 (*)    All other components within normal limits  POCT I-STAT EG7 - Abnormal; Notable for the following components:   pH, Ven 7.178 (*)    pCO2, Ven 62.3 (*)    pO2, Ven 59.0 (*)    Acid-base deficit 6.0 (*)    Sodium 131 (*)    Potassium 7.6 (*)    Calcium, Ion 0.78 (*)    All other components within normal limits  POCT I-STAT 7, (LYTES, BLD GAS, ICA,H+H) - Abnormal; Notable for the following components:   pH, Arterial 7.101 (*)    pCO2 arterial 76.4 (*)    pO2, Arterial 74.0 (*)    Acid-base deficit 7.0 (*)    Sodium 132 (*)    Potassium 6.9 (*)    Calcium, Ion 0.87 (*)    All other components within normal limits  SARS CORONAVIRUS 2 (TAT 6-24 HRS)  URINALYSIS, ROUTINE W REFLEX MICROSCOPIC  POTASSIUM  I-STAT ARTERIAL BLOOD GAS, ED  TROPONIN I (HIGH SENSITIVITY)    EKG EKG Interpretation  Date/Time:  Sunday August 18 2019 21:18:38 EST Ventricular Rate:  81 PR Interval:  234 QRS Duration: 124 QT Interval:  422 QTC Calculation: 490 R Axis:   -68 Text Interpretation: Sinus rhythm with 1st degree A-V block Left anterior fascicular block Possible Lateral infarct , age undetermined Abnormal ECG No significant change since last tracing Confirmed by Deno Etienne [phone]) on 08/19/2019 7:03:27 AM   Radiology Dg Chest 2 View  Result Date: 08/18/2019 CLINICAL DATA:  Shortness of breath EXAM: CHEST - 2 VIEW COMPARISON:  06/03/2019 FINDINGS: Cardiomegaly. Left neck multi lumen vascular catheter. Mild diffuse interstitial pulmonary opacity. Cervicothoracic fusion. Metallic debris about the right neck and upper chest. IMPRESSION: Cardiomegaly with mild diffuse interstitial pulmonary opacity, likely mild edema. Electronically Signed   By: Eddie Candle M.D.   On: 08/18/2019 21:42   Procedures Procedures (including critical care time)  Medications Ordered in ED Medications  sodium  chloride flush (NS) 0.9 % injection 3 mL (has no administration in time range)  sodium zirconium cyclosilicate (LOKELMA) packet 10 g (has no administration in time range)  calcium gluconate 1 g/ 50 mL sodium chloride IVPB (has no administration in time range)  insulin aspart (novoLOG) injection 5 Units (has no administration in time range)    And  dextrose 50 % solution 50 mL (has no administration in time range)  dextrose 10 % infusion (has no administration in time range)    Initial Impression / Assessment and Plan / ED Course  I have reviewed the triage vital signs and the nursing notes.  Pertinent labs & imaging results that were available during my care of the patient were reviewed by me and considered in my medical decision making (see chart for details).        [name] is a 53 year old male with a past medical history of ESRD on HD on MWF, type 2 DM, obesity hypoventilation disorder with chronic hypercarbia who presents to the emergency room with shortness of breath, cough, nausea and anxiety with stable vitals on home O2 found to have a calcium of 6.4 and a potassium of 6.2 who does not seem to be volume overloaded and in need of urgent dialysis.    Shortness of breath may be secondary to volume overload in setting of needing dialysis, however, patient does not appear volume loaded on exam and his lungs are  without crackles.    Shortness of breath may be secondary to a PE, however, this is unlikely as pt not tachycardic, has no recent risk factors for PE.  He is not on a blood thinner, however.    Shortness of breath and cough are concerning for possible URI, however, patient without fever, chills, rhinorrhea, sore throat and he denies sick contacts.   Given recent increase in cases, there is a slight concern for COVID.  Likely that patient's shortness of breath are secondary to his known obesity hypoventilation disorder.  He is not hypoxic and does not appear in any  respiratory distress.  Patient's vague symptoms of nausea and anxiety may be secondary to his hypocalcemia.  We will let nephrology manage electrolytes outpatient.  We will recheck electrolytes with i-STAT VBG.  We have reached out to nephrology who states that low calcium is common in ESRD patients and that oral calcium can be given unless very symptomatic which would require IV.  As the patient does not appear to have an emergent condition or need urgent dialysis we will likely discharge with plan to allow nephrology to manage electrolytes.  Dialysis coordinator states that we may proceed with Covid testing and he can still be treated at dialysis as a person under investigation.  She is setting up his dialysis after discharge today.   ---  I-STAT VBG with pH 7.178, potassium 7.6, ionized calcium of 0.78 which is critically low, below lower limit of normal of 1.15.  Have given patient IV calcium gluconate for membrane stabilization.  Giving insulin and dextrose.  Rechecking potassium and ABG.  Nephrology coming to see the patient.  Plans for outpatient dialysis now unlikely.   ---  I-STAT ABG with pH 7.101, PCO2 76.4, potassium 6.9, ionized calcium 0.87.  Patient will likely need urgent dialysis versus inpatient admission pending nephrology's input.   ---  Plan for urgent dialysis here prior to discharge.  Final Clinical Impressions(s) / ED Diagnoses   Final diagnoses:  Chronic left-sided low back pain, unspecified whether sciatica present  Nausea  Anxiety  Shortness of breath  Cough  Hypocalcemia  Hyperkalemia    ED Discharge Orders    None       Al Decant, MD 08/19/19 [place], DO 08/19/19 1512

## 2019-08-19 NOTE — ED Notes (Signed)
Potassium  Greater than  7.5

## 2019-08-19 NOTE — Progress Notes (Signed)
Nephrology Quick Note:  Scant amount of purulent drainage noted from [facility] exit site during dialysis. Patient is afebrile and asymptomatic. Will consult pharmacy for empiric HD dosing and order blood cultures. Antibiotics can be continued at outpatient HD unit.  Anice Paganini, PA-C 08/19/2019, 2:44 PM  [facility] Pager: [phone]

## 2019-08-19 NOTE — ED Notes (Signed)
Patient transported to Dialysis

## 2019-08-19 NOTE — ED Notes (Signed)
Patient verbalizes understanding of discharge instructions. Opportunity for questioning and answers were provided. Armband removed by staff, pt discharged from ED.  

## 2019-08-19 NOTE — Discharge Instructions (Addendum)
You were seen in the emergency room for shortness of breath, cough, nausea and anxiety. You were found to have some electrolyte abnormalities which may have been contributing to some of your symptoms. We treated those electrolyte abnormalities with medicine and then sent you for dialysis. Continue MWF dialysis and follow up with your physicians as previously scheduled. If your shortness of breath acutely worsens you can come back to the emergency department.

## 2019-08-19 NOTE — Consult Note (Addendum)
[facility] Renal Consultation Note    Indication for Consultation:  Management of ESRD/hemodialysis, anemia, hypertension/volume, and secondary hyperparathyroidism. PCP:  HPI: Seth [name] is a 53 y.o. male with a history of ESRD on dialysis, T2DM, discitis, and sleep apnea who presented to the ED on 08/19/2019 with back pain. He reports back pain is chronic but feels he over exerted himself and made the pain worse and is out of pain medication.  Reports he has been referred to an outpatient pain management clinic. He also told the triage RN that he "felt funny" since starting home O2.  Patient was also seen in the ED yesterday for SOB. He was started on home oxygen last week and wanted to make sure his O2 was still working. O2 sats were normal on his home equipment and he decided to follow up with his PCP without further work up. On presentation to the ED, K+ 6.2 with repeat values ranging from 6.8 to 7.6. Calcium 6.4,  BUN 91, Cr 14.51, WBC 12.7, Hgb 13.3. CXR on 08/18/19 with cardiomegaly, mild diffuse interstial pulmonary opacity, likely mild edema. Nephrology was consulted. He was given IV calcium gluconate, insulin and dextrose, and lokelma was ordered. He did desat to the 80's when moving in bed without O2.   Patient normally dialyzes at [facility] on MWF. He denies missing any treatments and did complete about 4 hours of dialysis on 08/16/2019. On exam, patient reports he feels fine other back pain. He reports baseline SOB. Denies cough, CP, palpitations, dizziness, abdominal pain, N/V/D. Denies new onset weakness. Renal navigator was able to secure the patient a seat at his outpatient HD unit for 4pm, however given hyperkalemia patient will need to receive HD here.  Past Medical History:  Diagnosis Date  . Acute on chronic respiratory failure with hypoxia and hypercapnia ([place]) 05/29/2019  . Diabetes mellitus    INSULIN DEPENDENT DIABETES  . Dialysis patient ([place])   .  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 ESRD (end stage renal disease) on dialysis [facility])    [facility] M/W/F  . Hypertension   . 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 Sleep apnea    USES CPAP  . Type 2 diabetes mellitus ([place])    Past Surgical History:  Procedure Laterality Date  . A/V FISTULAGRAM N/A 10/18/2018   Procedure: A/V FISTULAGRAM;  Surgeon: Marty Heck, MD;  Location: [place] [facility];  Service: Cardiovascular;  Laterality: N/A;  . A/V FISTULAGRAM Left 03/28/2019   Procedure: A/V Fistulagram;  Surgeon: Marty Heck, MD;  Location: [place] [facility];  Service: Cardiovascular;  Laterality: Left;  . AV FISTULA PLACEMENT  05/03/2012   Procedure: ARTERIOVENOUS (AV) FISTULA CREATION;  Surgeon: Mal [name], MD;  Location: [place];  Service: Vascular;  Laterality: Right;  . AV FISTULA PLACEMENT Left 10/23/2018   Procedure: ARTERIOVENOUS (AV) FISTULA CREATION;  Surgeon: Waynetta Sandy, MD;  Location: [place];  Service: Vascular;  Laterality: Left;  . BASCILIC VEIN TRANSPOSITION Left 04/02/2019   Procedure: BASILIC VEIN TRANSPOSITION SECOND STAGE LEFT;  Surgeon: Waynetta Sandy, MD;  Location: [place];  Service: Vascular;  Laterality: Left;  . INSERTION OF DIALYSIS CATHETER Left 12/03/2018   Procedure: INSERTION OF DIALYSIS CATHETER;  Surgeon: Marty Heck, MD;  Location: [place];  Service: Vascular;  Laterality: Left;  . INSERTION OF DIALYSIS CATHETER Left 06/03/2019   Procedure: INSERTION OF DIALYSIS  CATHETER;  Surgeon: Marty Heck, MD;  Location: [place];  Service: Vascular;  Laterality: Left;  . ORIF MANDIBULAR FRACTURE N/A 11/10/2017   Procedure: OPEN REDUCTION INTERNAL FIXATION (ORIF) MANDIBULAR FRACTURE POSSIBLE MAXILLARY MANDIBULAR FIXATION;  Surgeon: Jodi Marble, MD;  Location: [place];  Service: ENT;  Laterality: N/A;  . REVISON OF ARTERIOVENOUS FISTULA Right 05/31/2018   Procedure:  REVISION OF ARTERIOVENOUS FISTULA  RIGHT ARM WITH INTERPOSITION ARTEGRAFT 6MM X 16CM GRAFT;  Surgeon: Waynetta Sandy, MD;  Location: [place];  Service: Vascular;  Laterality: Right;  . TEE WITHOUT CARDIOVERSION N/A 07/10/2015   Procedure: TRANSESOPHAGEAL ECHOCARDIOGRAM (TEE);  Surgeon: Josue Hector, MD;  Location: [place];  Service: Cardiovascular;  Laterality: N/A;  . TEE WITHOUT CARDIOVERSION N/A 12/07/2018   Procedure: TRANSESOPHAGEAL ECHOCARDIOGRAM (TEE);  Surgeon: Acie Fredrickson Wonda Cheng, MD;  Location: [place];  Service: Cardiovascular;  Laterality: N/A;  . TEE WITHOUT CARDIOVERSION N/A 06/03/2019   Procedure: Transesophageal Echocardiogram (Tee);  Surgeon: Acie Fredrickson Wonda Cheng, MD;  Location: [place];  Service: Cardiovascular;  Laterality: N/A;  . tracheostomy removal    . TRACHEOSTOMY TUBE PLACEMENT N/A 08/20/2013   Procedure: TRACHEOSTOMY Revision;  Surgeon: Melida Quitter, MD;  Location: [place];  Service: ENT;  Laterality: N/A;  . UPPER EXTREMITY VENOGRAPHY Bilateral 03/28/2019   Procedure: UPPER EXTREMITY VENOGRAPHY;  Surgeon: Marty Heck, MD;  Location: [place] [facility];  Service: Cardiovascular;  Laterality: Bilateral;   Family History  Problem Relation Age of Onset  . Diabetes Mother   . Cancer Mother   . Diabetes Sister   . Diabetes Brother    Social History:  reports that he has never smoked. He has never used smokeless tobacco. He reports previous drug use. Drug: Marijuana. He reports that he does not drink alcohol.  ROS: As per HPI otherwise negative.   Physical Exam: Vitals:   08/19/19 0745 08/19/19 0830 08/19/19 0936 08/19/19 0937  BP: 137/62 (!) 157/79 (!) 146/107   Pulse:   82   Resp: 14  (!) 26   Temp:      TempSrc:      SpO2:   (!) 87% 95%  Weight:         General: Well developed, obese male, in no acute distress. Head: Normocephalic, atraumatic, sclera non-icteric, mucus membranes are moist. Neck:  JVD not elevated. Lungs: + Rhonchi upper  airways, decreased BS at bases but no wheezing or rales auscultated. Breathing is unlabored. Heart: RRR with normal S1, S2. No murmurs, rubs, or gallops appreciated. Abdomen: Soft, non-tender, non-distended with normoactive bowel sounds. No rebound/guarding. No obvious abdominal masses. Musculoskeletal:  Strength and tone appear normal for age. Lower extremities: No edema b/l lower extremities Neuro: Alert and oriented X 3. Moves all extremities spontaneously. Psych:  Responds to questions appropriately with a normal affect. Dialysis Access: LUE AVF + bruit, TDC without erythema/drainage  No Known Allergies Prior to Admission medications   Medication Sig Start Date End Date Taking? Authorizing Provider  amLODipine (NORVASC) 10 MG tablet Take 1 tablet (10 mg total) by mouth daily. 01/23/19   Annia Belt, MD  aspirin EC 81 MG tablet Take 81 mg by mouth daily.    [provider]  atorvastatin (LIPITOR) 40 MG tablet Take 40 mg by mouth daily.    [provider]  diclofenac sodium (VOLTAREN) 1 % GEL Apply 4 g topically 4 (four) times daily.    [provider]  ferric citrate (AURYXIA) 1 GM 210 MG(Fe) tablet  Take 420-630 mg by mouth See admin instructions. Take 3 tablets (630 mg) by mouth with meals and 2 tablets (420 mg) with snacks    [provider]  insulin aspart (NOVOLOG FLEXPEN) 100 UNIT/ML FlexPen Inject 0-20 Units into the skin 3 (three) times daily with meals.    [provider]  insulin degludec (TRESIBA FLEXTOUCH) 100 UNIT/ML SOPN FlexTouch Pen Inject 0.14 mLs (14 Units total) into the skin daily. 05/13/19   Mitzi Hansen, MD  Semaglutide,0.25 or 0.5MG /DOS, (OZEMPIC, 0.25 OR 0.5 MG/DOSE,) 2 MG/1.5ML SOPN Inject 0.25 mg into the skin once a week. 06/20/19   Forde Dandy, PharmD  senna (SENOKOT) 8.6 MG tablet Take 1 tablet by mouth daily.    [provider]   Current Facility-Administered Medications  Medication Dose Route  Frequency Provider Last Rate Last Dose  . calcium gluconate 1 g/ 50 mL sodium chloride IVPB  1 g Intravenous Once Deno Etienne, DO      . Chlorhexidine Gluconate Cloth 2 % PADS 6 each  6 each Topical Q0600 Collins, Samantha G, PA-C      . insulin aspart (novoLOG) injection 5 Units  5 Units Intravenous Once Al Decant, MD       And  . dextrose 50 % solution 50 mL  1 ampule Intravenous Once Al Decant, MD      . sodium chloride flush (NS) 0.9 % injection 3 mL  3 mL Intravenous Once Deno Etienne, DO       Current Outpatient Medications  Medication Sig Dispense Refill  . amLODipine (NORVASC) 10 MG tablet Take 1 tablet (10 mg total) by mouth daily. 90 tablet 1  . aspirin EC 81 MG tablet Take 81 mg by mouth daily.    Marland Kitchen atorvastatin (LIPITOR) 40 MG tablet Take 40 mg by mouth daily.    . diclofenac sodium (VOLTAREN) 1 % GEL Apply 4 g topically 4 (four) times daily.    . ferric citrate (AURYXIA) 1 GM 210 MG(Fe) tablet Take 420-630 mg by mouth See admin instructions. Take 3 tablets (630 mg) by mouth with meals and 2 tablets (420 mg) with snacks    . insulin aspart (NOVOLOG FLEXPEN) 100 UNIT/ML FlexPen Inject 0-20 Units into the skin 3 (three) times daily with meals.    . insulin degludec (TRESIBA FLEXTOUCH) 100 UNIT/ML SOPN FlexTouch Pen Inject 0.14 mLs (14 Units total) into the skin daily. 2 pen 3  . Semaglutide,0.25 or 0.5MG /DOS, (OZEMPIC, 0.25 OR 0.5 MG/DOSE,) 2 MG/1.5ML SOPN Inject 0.25 mg into the skin once a week. 1 pen 11  . senna (SENOKOT) 8.6 MG tablet Take 1 tablet by mouth daily.     Labs: Basic Metabolic Panel: Recent Labs  Lab 08/18/19 2121 08/19/19 0844 08/19/19 0908 08/19/19 0933  NA 134* 131*  --  132*  K 6.2* 7.6* >7.5* 6.9*  CL 93*  --   --   --   CO2 22  --   --   --   GLUCOSE 138*  --   --   --   BUN 91*  --   --   --   CREATININE 14.51*  --   --   --   CALCIUM 6.4*  --   --   --    Liver Function Tests: Recent Labs  Lab 08/18/19 2121  AST 11*  ALT 9  ALKPHOS  140*  BILITOT 0.6  PROT 9.4*  ALBUMIN 3.9   Recent Labs  Lab 08/18/19 2121  LIPASE  85*    CBC: Recent Labs  Lab 08/18/19 2121 08/19/19 0844 08/19/19 0933  WBC 12.7*  --   --   HGB 11.5* 13.3 13.3  HCT 39.2 39.0 39.0  MCV 103.7*  --   --   PLT 219  --   --    CBG: Recent Labs  Lab 08/15/19 1432  GLUCAP 125*   Studies/Results: Dg Chest 2 View  Result Date: 08/18/2019 CLINICAL DATA:  Shortness of breath EXAM: CHEST - 2 VIEW COMPARISON:  06/03/2019 FINDINGS: Cardiomegaly. Left neck multi lumen vascular catheter. Mild diffuse interstitial pulmonary opacity. Cervicothoracic fusion. Metallic debris about the right neck and upper chest. IMPRESSION: Cardiomegaly with mild diffuse interstitial pulmonary opacity, likely mild edema. Electronically Signed   By: Eddie Candle M.D.   On: 08/18/2019 21:42    Dialysis Orders: Center: [facility]  on MWF. Time: 4hr, BFR 400/ DFR 800, EDW 138.5kg, 2K/2.5 Ca, RIJ TDC Heparin 4500 unit load and 1000 units per hour Epogen 1200units last does 11/6 Hectorol 1.5 mcg IV q HD  Assessment/Plan: 1.  Hyperkalemia: K+ significantly elevated at 6.2-7.6 today. Received lokelma, calcium gluconate, insulin and bicarb in the ED. Will plan for urgent inpatient dialysis today. RN at outpatient unit reports K+ has been high lately in the 6's, dietary related. 2.  ESRD:  MWF at [facility]. Urgent HD today as above. Reportedly has poor compliance with diet, meds and fluid restrictions as an outpatient.  3.  Hypertension/volume: BP elevated, CXR with mild pulmonary edema. Patient denies SOB on home O2. He is 6.5kg above with outpatient EDW by weights here. HD today with UF goal 3.5L as tolerated.  4.  Anemia: Hemoglobin 13.3. No ESA indicated.  5.  Hypocalcemia: Calcium 6.4. Spoke to RN at outpatient HD unit, last Ca was 8.1. RN reports patient is supposed to be tasking 2 Tums in between his meals. Takes auryxia 3 tabs per meal and 2 with snacks.  Will restart here if admitted. Follow calcium/phos.  6.  Nutrition:  Renal diet with fluid restrictions  7. SOB: likely related to obesity hypoventilation with possible component of volume overload. UF with HD today as above. Continue home O2.   Anice Paganini, PA-C 08/19/2019, 11:40 AM  [facility] Pager: [phone]  Pt seen, examined and agree w A/P as above. Admit for [employer], not a lot of excess volume on exam.  Plan HD this afternoon after neg covid is back.  Acute meds being given in ED. Kelly Splinter  MD 08/19/2019, 12:39 PM

## 2019-08-19 NOTE — Progress Notes (Signed)
Pt A&Ox4, denies pain, SOB, dizziness, n/v or dizziness; Pt transported to the [place] section of the ED per [name], Therapist, sports.

## 2019-08-19 NOTE — ED Notes (Signed)
MD at bedside aware of patient sats.

## 2019-08-19 NOTE — ED Notes (Addendum)
Pt states he does not produce urine. Unable to obtain urine specimen.

## 2019-08-19 NOTE — Progress Notes (Signed)
Pharmacy Antibiotic Note  Seth [name] is a 53 y.o. male admitted on 08/18/2019 with Possible HD site infection .  Pharmacy has been consulted for Vancomycin dosing.  Weight: (on ED stretcher)  Temp (24hrs), Avg:98.5 F (36.9 C), Min:97.8 F (36.6 C), Max:99.1 F (37.3 C)  Recent Labs  Lab 08/18/19 2121  WBC 12.7*  CREATININE 14.51*    Estimated Creatinine Clearance: 8 mL/min (A) (by C-G formula based on SCr of 14.51 mg/dL (H)).    No Known Allergies  Antimicrobials this admission: 11/9 Vancomycin >>     Dose adjustments this admission:   Microbiology results: 11/9 BCx: Pending   Plan: - Patient is a HD patient and appears to received HD on MWF  - Will give a loading dose based on the patients Adjusted body weight of 98 kg - Vancomycin 2250 mg IV x 1 dose  - Will plan to start the patient on Vancomycin 1000 mg IV QHD  - Since dosing based off ABW would consider obtaining levels pre-HD (15-25 mcg/ml)   Thank you for allowing pharmacy to be a part of this patient's care.  Duanne Limerick PharmD. BCPS  08/19/2019 3:05 PM

## 2019-08-19 NOTE — Progress Notes (Signed)
Renal Navigator spoke with Nephrologist/Dr. Jonnie Finner regarding patient's HD plan for today. He states patient to have HD in the hospital today due to elevated K (greater than 7.5). Renal Navigator notified patient's OP HD clinic that he will not be there this afternoon for HD.   Alphonzo Cruise, [place] Renal Navigator [phone]

## 2019-08-21 ENCOUNTER — Other Ambulatory Visit: Payer: Self-pay | Admitting: *Deleted

## 2019-08-21 ENCOUNTER — Emergency Department (HOSPITAL_COMMUNITY): Payer: Medicare Other

## 2019-08-21 ENCOUNTER — Other Ambulatory Visit: Payer: Self-pay

## 2019-08-21 ENCOUNTER — Emergency Department (HOSPITAL_COMMUNITY)
Admission: EM | Admit: 2019-08-21 | Discharge: 2019-08-22 | Disposition: A | Discharge status: Home / Self Care | Payer: Medicare Other | Source: Home / Self Care | Attending: Emergency Medicine | Admitting: Emergency Medicine

## 2019-08-21 DIAGNOSIS — Y999 Unspecified external cause status: Secondary | ICD-10-CM | POA: Insufficient documentation

## 2019-08-21 DIAGNOSIS — R05 Cough: Secondary | ICD-10-CM | POA: Diagnosis not present

## 2019-08-21 DIAGNOSIS — Z992 Dependence on renal dialysis: Secondary | ICD-10-CM | POA: Insufficient documentation

## 2019-08-21 DIAGNOSIS — Y939 Activity, unspecified: Secondary | ICD-10-CM | POA: Insufficient documentation

## 2019-08-21 DIAGNOSIS — S39012A Strain of muscle, fascia and tendon of lower back, initial encounter: Secondary | ICD-10-CM

## 2019-08-21 DIAGNOSIS — W010XXA Fall on same level from slipping, tripping and stumbling without subsequent striking against object, initial encounter: Secondary | ICD-10-CM | POA: Insufficient documentation

## 2019-08-21 DIAGNOSIS — Z7982 Long term (current) use of aspirin: Secondary | ICD-10-CM | POA: Insufficient documentation

## 2019-08-21 DIAGNOSIS — Y929 Unspecified place or not applicable: Secondary | ICD-10-CM | POA: Insufficient documentation

## 2019-08-21 DIAGNOSIS — N186 End stage renal disease: Secondary | ICD-10-CM | POA: Insufficient documentation

## 2019-08-21 DIAGNOSIS — Z794 Long term (current) use of insulin: Secondary | ICD-10-CM | POA: Insufficient documentation

## 2019-08-21 DIAGNOSIS — E1122 Type 2 diabetes mellitus with diabetic chronic kidney disease: Secondary | ICD-10-CM | POA: Insufficient documentation

## 2019-08-21 DIAGNOSIS — E875 Hyperkalemia: Secondary | ICD-10-CM

## 2019-08-21 DIAGNOSIS — R0602 Shortness of breath: Secondary | ICD-10-CM | POA: Diagnosis not present

## 2019-08-21 DIAGNOSIS — I120 Hypertensive chronic kidney disease with stage 5 chronic kidney disease or end stage renal disease: Secondary | ICD-10-CM | POA: Insufficient documentation

## 2019-08-21 DIAGNOSIS — Z79899 Other long term (current) drug therapy: Secondary | ICD-10-CM | POA: Insufficient documentation

## 2019-08-21 LAB — CBC
HCT: 36.8 % — ABNORMAL LOW (ref 39.0–52.0)
Hemoglobin: 10.9 g/dL — ABNORMAL LOW (ref 13.0–17.0)
MCH: 30 pg (ref 26.0–34.0)
MCHC: 29.6 g/dL — ABNORMAL LOW (ref 30.0–36.0)
MCV: 101.4 fL — ABNORMAL HIGH (ref 80.0–100.0)
Platelets: 217 10*3/uL (ref 150–400)
RBC: 3.63 MIL/uL — ABNORMAL LOW (ref 4.22–5.81)
RDW: 17.3 % — ABNORMAL HIGH (ref 11.5–15.5)
WBC: 10.1 10*3/uL (ref 4.0–10.5)
nRBC: 0.4 % — ABNORMAL HIGH (ref 0.0–0.2)

## 2019-08-21 LAB — BASIC METABOLIC PANEL
Anion gap: 18 — ABNORMAL HIGH (ref 5–15)
BUN: 88 mg/dL — ABNORMAL HIGH (ref 6–20)
CO2: 22 mmol/L (ref 22–32)
Calcium: 6.2 mg/dL — CL (ref 8.9–10.3)
Chloride: 91 mmol/L — ABNORMAL LOW (ref 98–111)
Creatinine, Ser: 15.21 mg/dL — ABNORMAL HIGH (ref 0.61–1.24)
GFR calc Af Amer: 4 mL/min — ABNORMAL LOW (ref >60)
GFR calc non Af Amer: 3 mL/min — ABNORMAL LOW (ref >60)
Glucose, Bld: 109 mg/dL — ABNORMAL HIGH (ref 70–99)
Potassium: 6.4 mmol/L (ref 3.5–5.1)
Sodium: 131 mmol/L — ABNORMAL LOW (ref 135–145)

## 2019-08-21 MED ORDER — AMLODIPINE BESYLATE 10 MG PO TABS: 10.0000 mg | ORAL_TABLET | Freq: Every day | ORAL | 1 refills | Status: AC

## 2019-08-21 MED ORDER — OXYCODONE-ACETAMINOPHEN 5-325 MG PO TABS
1.0000 | ORAL_TABLET | Freq: Once | ORAL | Status: AC
  Administered 2019-08-21: 1 via ORAL
  Filled 2019-08-21: qty 1

## 2019-08-21 MED ORDER — OXYCODONE-ACETAMINOPHEN 5-325 MG PO TABS: 1.0000 | ORAL_TABLET | Freq: Four times a day (QID) | ORAL | 0 refills | Status: DC | PRN

## 2019-08-21 MED ORDER — SODIUM ZIRCONIUM CYCLOSILICATE 10 G PO PACK
10.0000 g | PACK | Freq: Once | ORAL | Status: AC
  Administered 2019-08-21: 10 g via ORAL
  Filled 2019-08-21: qty 1
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
## 2019-08-21 NOTE — Discharge Instructions (Addendum)
1.  Your dialysis center will be calling you in the morning to get you in for a session tomorrow.  Be sure to answer your phone tomorrow morning. 2.  You have been given a few Percocet to take for back pain.  Use them very sparingly.  Transition to regular strength Tylenol as soon as possible. 3.  See your doctor for recheck this week.

## 2019-08-21 NOTE — ED Triage Notes (Signed)
Dialysis pt, completed here on 08/19/19

## 2019-08-21 NOTE — ED Notes (Signed)
Date and time results received: 08/21/19    Test: k+ 6.4 Cal 6.2 Critical Value:   Name of Provider Notified: Dr. Tyrone Nine  Orders Received? Or Actions Taken?: New orders ekg chest xray

## 2019-08-21 NOTE — ED Triage Notes (Signed)
Pt in reporting back pain, chronic in nature. Has been seen multiple times for same. Pt on chronic O2.

## 2019-08-22 ENCOUNTER — Other Ambulatory Visit: Payer: Self-pay

## 2019-08-22 ENCOUNTER — Emergency Department (HOSPITAL_COMMUNITY): Payer: Medicare Other

## 2019-08-22 ENCOUNTER — Encounter (HOSPITAL_COMMUNITY): Payer: Self-pay | Admitting: *Deleted

## 2019-08-22 ENCOUNTER — Inpatient Hospital Stay (HOSPITAL_COMMUNITY)
Admission: EM | Admit: 2019-08-22 | Discharge: 2019-08-26 | DRG: 208 | Disposition: A | Discharge status: Home Health | Payer: Medicare Other | Attending: Internal Medicine | Admitting: Internal Medicine

## 2019-08-22 ENCOUNTER — Observation Stay (HOSPITAL_COMMUNITY): Payer: Medicare Other

## 2019-08-22 DIAGNOSIS — Z794 Long term (current) use of insulin: Secondary | ICD-10-CM

## 2019-08-22 DIAGNOSIS — N186 End stage renal disease: Secondary | ICD-10-CM | POA: Diagnosis not present

## 2019-08-22 DIAGNOSIS — J810 Acute pulmonary edema: Secondary | ICD-10-CM | POA: Diagnosis present

## 2019-08-22 DIAGNOSIS — I120 Hypertensive chronic kidney disease with stage 5 chronic kidney disease or end stage renal disease: Secondary | ICD-10-CM | POA: Diagnosis present

## 2019-08-22 DIAGNOSIS — G92 Toxic encephalopathy: Secondary | ICD-10-CM | POA: Diagnosis present

## 2019-08-22 DIAGNOSIS — E875 Hyperkalemia: Secondary | ICD-10-CM | POA: Diagnosis not present

## 2019-08-22 DIAGNOSIS — E872 Acidosis: Secondary | ICD-10-CM | POA: Diagnosis present

## 2019-08-22 DIAGNOSIS — S39012A Strain of muscle, fascia and tendon of lower back, initial encounter: Secondary | ICD-10-CM | POA: Diagnosis present

## 2019-08-22 DIAGNOSIS — E662 Morbid (severe) obesity with alveolar hypoventilation: Secondary | ICD-10-CM | POA: Diagnosis present

## 2019-08-22 DIAGNOSIS — Z01818 Encounter for other preprocedural examination: Secondary | ICD-10-CM

## 2019-08-22 DIAGNOSIS — Z20828 Contact with and (suspected) exposure to other viral communicable diseases: Secondary | ICD-10-CM | POA: Diagnosis present

## 2019-08-22 DIAGNOSIS — D631 Anemia in chronic kidney disease: Secondary | ICD-10-CM | POA: Diagnosis present

## 2019-08-22 DIAGNOSIS — M7918 Myalgia, other site: Secondary | ICD-10-CM

## 2019-08-22 DIAGNOSIS — R0902 Hypoxemia: Secondary | ICD-10-CM | POA: Diagnosis not present

## 2019-08-22 DIAGNOSIS — E1122 Type 2 diabetes mellitus with diabetic chronic kidney disease: Secondary | ICD-10-CM | POA: Diagnosis present

## 2019-08-22 DIAGNOSIS — J9621 Acute and chronic respiratory failure with hypoxia: Secondary | ICD-10-CM

## 2019-08-22 DIAGNOSIS — Z992 Dependence on renal dialysis: Secondary | ICD-10-CM | POA: Diagnosis not present

## 2019-08-22 DIAGNOSIS — J9611 Chronic respiratory failure with hypoxia: Secondary | ICD-10-CM | POA: Diagnosis not present

## 2019-08-22 DIAGNOSIS — W010XXA Fall on same level from slipping, tripping and stumbling without subsequent striking against object, initial encounter: Secondary | ICD-10-CM | POA: Diagnosis present

## 2019-08-22 DIAGNOSIS — F329 Major depressive disorder, single episode, unspecified: Secondary | ICD-10-CM | POA: Diagnosis present

## 2019-08-22 DIAGNOSIS — N179 Acute kidney failure, unspecified: Secondary | ICD-10-CM | POA: Diagnosis present

## 2019-08-22 DIAGNOSIS — Z4659 Encounter for fitting and adjustment of other gastrointestinal appliance and device: Secondary | ICD-10-CM | POA: Diagnosis not present

## 2019-08-22 DIAGNOSIS — M4646 Discitis, unspecified, lumbar region: Secondary | ICD-10-CM | POA: Diagnosis not present

## 2019-08-22 DIAGNOSIS — J9601 Acute respiratory failure with hypoxia: Secondary | ICD-10-CM | POA: Diagnosis present

## 2019-08-22 DIAGNOSIS — E8770 Fluid overload, unspecified: Secondary | ICD-10-CM | POA: Diagnosis present

## 2019-08-22 DIAGNOSIS — M549 Dorsalgia, unspecified: Secondary | ICD-10-CM | POA: Diagnosis not present

## 2019-08-22 DIAGNOSIS — S3992XA Unspecified injury of lower back, initial encounter: Secondary | ICD-10-CM | POA: Diagnosis not present

## 2019-08-22 DIAGNOSIS — Z833 Family history of diabetes mellitus: Secondary | ICD-10-CM

## 2019-08-22 DIAGNOSIS — B963 Hemophilus influenzae [H. influenzae] as the cause of diseases classified elsewhere: Secondary | ICD-10-CM | POA: Diagnosis present

## 2019-08-22 DIAGNOSIS — L8990 Pressure ulcer of unspecified site, unspecified stage: Secondary | ICD-10-CM | POA: Insufficient documentation

## 2019-08-22 DIAGNOSIS — G8911 Acute pain due to trauma: Secondary | ICD-10-CM

## 2019-08-22 DIAGNOSIS — J9622 Acute and chronic respiratory failure with hypercapnia: Principal | ICD-10-CM | POA: Diagnosis present

## 2019-08-22 DIAGNOSIS — Z6841 Body Mass Index (BMI) 40.0 and over, adult: Secondary | ICD-10-CM

## 2019-08-22 DIAGNOSIS — J40 Bronchitis, not specified as acute or chronic: Secondary | ICD-10-CM | POA: Diagnosis present

## 2019-08-22 DIAGNOSIS — M48061 Spinal stenosis, lumbar region without neurogenic claudication: Secondary | ICD-10-CM | POA: Diagnosis not present

## 2019-08-22 DIAGNOSIS — G8929 Other chronic pain: Secondary | ICD-10-CM | POA: Diagnosis present

## 2019-08-22 DIAGNOSIS — F4310 Post-traumatic stress disorder, unspecified: Secondary | ICD-10-CM | POA: Diagnosis present

## 2019-08-22 LAB — CBC WITH DIFFERENTIAL/PLATELET
Abs Immature Granulocytes: 0.04 10*3/uL (ref 0.00–0.07)
Basophils Absolute: 0.1 10*3/uL (ref 0.0–0.1)
Basophils Relative: 1 %
Eosinophils Absolute: 0.1 10*3/uL (ref 0.0–0.5)
Eosinophils Relative: 1 %
HCT: 37.6 % — ABNORMAL LOW (ref 39.0–52.0)
Hemoglobin: 11.2 g/dL — ABNORMAL LOW (ref 13.0–17.0)
Immature Granulocytes: 0 %
Lymphocytes Relative: 8 %
Lymphs Abs: 0.9 10*3/uL (ref 0.7–4.0)
MCH: 30.3 pg (ref 26.0–34.0)
MCHC: 29.8 g/dL — ABNORMAL LOW (ref 30.0–36.0)
MCV: 101.6 fL — ABNORMAL HIGH (ref 80.0–100.0)
Monocytes Absolute: 0.9 10*3/uL (ref 0.1–1.0)
Monocytes Relative: 9 %
Neutro Abs: 8.5 10*3/uL — ABNORMAL HIGH (ref 1.7–7.7)
Neutrophils Relative %: 81 %
Platelets: 221 10*3/uL (ref 150–400)
RBC: 3.7 MIL/uL — ABNORMAL LOW (ref 4.22–5.81)
RDW: 17.5 % — ABNORMAL HIGH (ref 11.5–15.5)
WBC: 10.4 10*3/uL (ref 4.0–10.5)
nRBC: 0.4 % — ABNORMAL HIGH (ref 0.0–0.2)

## 2019-08-22 LAB — BASIC METABOLIC PANEL
Anion gap: 20 — ABNORMAL HIGH (ref 5–15)
BUN: 97 mg/dL — ABNORMAL HIGH (ref 6–20)
CO2: 20 mmol/L — ABNORMAL LOW (ref 22–32)
Calcium: 6.1 mg/dL — CL (ref 8.9–10.3)
Chloride: 90 mmol/L — ABNORMAL LOW (ref 98–111)
Creatinine, Ser: 16.75 mg/dL — ABNORMAL HIGH (ref 0.61–1.24)
GFR calc Af Amer: 3 mL/min — ABNORMAL LOW (ref >60)
GFR calc non Af Amer: 3 mL/min — ABNORMAL LOW (ref >60)
Glucose, Bld: 143 mg/dL — ABNORMAL HIGH (ref 70–99)
Potassium: 6.5 mmol/L (ref 3.5–5.1)
Sodium: 130 mmol/L — ABNORMAL LOW (ref 135–145)

## 2019-08-22 LAB — SARS CORONAVIRUS 2 (TAT 6-24 HRS): SARS Coronavirus 2: NEGATIVE

## 2019-08-22 LAB — CBG MONITORING, ED
Glucose-Capillary: 88 mg/dL (ref 70–99)
Glucose-Capillary: 97 mg/dL (ref 70–99)

## 2019-08-22 LAB — GLUCOSE, CAPILLARY: Glucose-Capillary: 149 mg/dL — ABNORMAL HIGH (ref 70–99)

## 2019-08-22 MED ORDER — DICLOFENAC SODIUM 1 % TD GEL
2.0000 g | Freq: Four times a day (QID) | TRANSDERMAL | Status: DC
  Administered 2019-08-22 – 2019-08-26 (×9): 2 g via TOPICAL
  Filled 2019-08-22 (×3): qty 100

## 2019-08-22 MED ORDER — CHLORHEXIDINE GLUCONATE 4 % EX LIQD
CUTANEOUS | Status: AC
  Filled 2019-08-22: qty 15

## 2019-08-22 MED ORDER — HEPARIN SODIUM (PORCINE) 5000 UNIT/ML IJ SOLN
5000.0000 [IU] | Freq: Three times a day (TID) | INTRAMUSCULAR | Status: DC
  Administered 2019-08-22 – 2019-08-26 (×9): 5000 [IU] via SUBCUTANEOUS
  Filled 2019-08-22 (×10): qty 1

## 2019-08-22 MED ORDER — LIDOCAINE 5 % EX PTCH
1.0000 | MEDICATED_PATCH | CUTANEOUS | Status: AC
  Administered 2019-08-22: 11:00:00 1 via TRANSDERMAL
  Filled 2019-08-22: qty 1

## 2019-08-22 MED ORDER — CHLORHEXIDINE GLUCONATE CLOTH 2 % EX PADS
6.0000 | MEDICATED_PAD | Freq: Every day | CUTANEOUS | Status: DC
  Administered 2019-08-23 (×2): 6 via TOPICAL

## 2019-08-22 MED ORDER — CHLORHEXIDINE GLUCONATE CLOTH 2 % EX PADS
6.0000 | MEDICATED_PAD | Freq: Every day | CUTANEOUS | Status: DC
  Administered 2019-08-25: 6 via TOPICAL

## 2019-08-22 MED ORDER — PROMETHAZINE HCL 25 MG PO TABS: 12.5000 mg | ORAL_TABLET | Freq: Four times a day (QID) | ORAL | Status: DC | PRN

## 2019-08-22 MED ORDER — INSULIN ASPART 100 UNIT/ML ~~LOC~~ SOLN: 0.0000 [IU] | Freq: Three times a day (TID) | SUBCUTANEOUS | Status: DC

## 2019-08-22 MED ORDER — DOXERCALCIFEROL 4 MCG/2ML IV SOLN
1.5000 ug | INTRAVENOUS | Status: DC
  Administered 2019-08-23: 1.5 ug via INTRAVENOUS
  Filled 2019-08-22 (×3): qty 2

## 2019-08-22 MED ORDER — ACETAMINOPHEN 650 MG RE SUPP: 650.0000 mg | Freq: Four times a day (QID) | RECTAL | Status: DC | PRN

## 2019-08-22 MED ORDER — HEPARIN SODIUM (PORCINE) 1000 UNIT/ML IJ SOLN
INTRAMUSCULAR | Status: AC
  Filled 2019-08-22: qty 1

## 2019-08-22 MED ORDER — INSULIN GLARGINE 100 UNIT/ML ~~LOC~~ SOLN
10.0000 [IU] | Freq: Every day | SUBCUTANEOUS | Status: DC
  Administered 2019-08-23: 10 [IU] via SUBCUTANEOUS
  Filled 2019-08-22 (×3): qty 0.1

## 2019-08-22 MED ORDER — AMLODIPINE BESYLATE 10 MG PO TABS: 10.0000 mg | ORAL_TABLET | Freq: Every day | ORAL | Status: DC

## 2019-08-22 MED ORDER — ASPIRIN EC 81 MG PO TBEC
81.0000 mg | DELAYED_RELEASE_TABLET | Freq: Every day | ORAL | Status: DC
  Administered 2019-08-22: 19:00:00 81 mg via ORAL
  Filled 2019-08-22: qty 1

## 2019-08-22 MED ORDER — LIDOCAINE HCL 1 % IJ SOLN
INTRAMUSCULAR | Status: AC
  Filled 2019-08-22: qty 20

## 2019-08-22 MED ORDER — CALCIUM CARBONATE ANTACID 500 MG PO CHEW
2.0000 | CHEWABLE_TABLET | Freq: Three times a day (TID) | ORAL | Status: DC
  Administered 2019-08-22 – 2019-08-26 (×11): 400 mg via ORAL
  Filled 2019-08-22 (×11): qty 2

## 2019-08-22 MED ORDER — FERRIC CITRATE 1 GM 210 MG(FE) PO TABS
630.0000 mg | ORAL_TABLET | Freq: Three times a day (TID) | ORAL | Status: DC
  Administered 2019-08-23 – 2019-08-26 (×9): 630 mg via ORAL
  Filled 2019-08-22 (×4): qty 3
  Filled 2019-08-22: qty 1
  Filled 2019-08-22 (×10): qty 3

## 2019-08-22 MED ORDER — INSULIN DEGLUDEC 100 UNIT/ML ~~LOC~~ SOPN: 10.0000 [IU] | PEN_INJECTOR | Freq: Every day | SUBCUTANEOUS | Status: DC

## 2019-08-22 MED ORDER — SENNOSIDES-DOCUSATE SODIUM 8.6-50 MG PO TABS: 1.0000 | ORAL_TABLET | Freq: Every evening | ORAL | Status: DC | PRN

## 2019-08-22 MED ORDER — ATORVASTATIN CALCIUM 40 MG PO TABS
40.0000 mg | ORAL_TABLET | Freq: Every day | ORAL | Status: DC
  Administered 2019-08-23 – 2019-08-26 (×4): 40 mg via ORAL
  Filled 2019-08-22 (×4): qty 1

## 2019-08-22 MED ORDER — ACETAMINOPHEN 325 MG PO TABS: 650.0000 mg | ORAL_TABLET | Freq: Four times a day (QID) | ORAL | Status: DC | PRN
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
## 2019-08-22 NOTE — Progress Notes (Signed)
Request to IR for tunneled HD catheter evaluation/possible exchange due to reported pus from track.   Patient was brought to IR today for possible procedure - patient denies noting any pus recently from his HD catheter and states that it was last used successfully for HD on 11/9. He denies any pain, erythema or edema at the insertion site. He does not know why it would need to be evaluated or exchanged.  Left tunneled IJ catheter examined by myself today in IR suite - no drainage, bleeding, erythema, warmth, edema or pain to palpation. No obvious concerns for line infection noted. No obvious areas of breakage along exposed catheter.   Per chart there are no concerns with the catheter noted at this time. Attempted to discuss with nephrology PA however I was unable to get in touch with their service at this time - I discussed this order with Dr. Maricela Bo who is also unclear why this order was placed.   I have cancelled the order at this time, IR remains available if needed.  Please call with questions or concerns.     Candiss Norse, PA-C

## 2019-08-22 NOTE — ED Notes (Signed)
Pt received dinner tray.

## 2019-08-22 NOTE — ED Triage Notes (Signed)
Patient states he was here in the ED earlier today for back pain , States he was going to dialysis this am and slipped on his wet porch, c/o lower back pain.

## 2019-08-22 NOTE — ED Notes (Signed)
Pt transported to IR 
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
## 2019-08-23 ENCOUNTER — Encounter (HOSPITAL_COMMUNITY): Payer: Self-pay | Admitting: *Deleted

## 2019-08-23 ENCOUNTER — Observation Stay (HOSPITAL_COMMUNITY): Payer: Medicare Other

## 2019-08-23 ENCOUNTER — Other Ambulatory Visit: Payer: Self-pay

## 2019-08-23 DIAGNOSIS — J9622 Acute and chronic respiratory failure with hypercapnia: Secondary | ICD-10-CM | POA: Diagnosis present

## 2019-08-23 DIAGNOSIS — Z4659 Encounter for fitting and adjustment of other gastrointestinal appliance and device: Secondary | ICD-10-CM | POA: Diagnosis not present

## 2019-08-23 DIAGNOSIS — J9621 Acute and chronic respiratory failure with hypoxia: Secondary | ICD-10-CM

## 2019-08-23 DIAGNOSIS — F4310 Post-traumatic stress disorder, unspecified: Secondary | ICD-10-CM | POA: Diagnosis present

## 2019-08-23 DIAGNOSIS — Z6841 Body Mass Index (BMI) 40.0 and over, adult: Secondary | ICD-10-CM | POA: Diagnosis not present

## 2019-08-23 DIAGNOSIS — J9601 Acute respiratory failure with hypoxia: Secondary | ICD-10-CM | POA: Diagnosis not present

## 2019-08-23 DIAGNOSIS — E875 Hyperkalemia: Secondary | ICD-10-CM | POA: Diagnosis present

## 2019-08-23 DIAGNOSIS — Z9911 Dependence on respirator [ventilator] status: Secondary | ICD-10-CM | POA: Diagnosis not present

## 2019-08-23 DIAGNOSIS — S39012A Strain of muscle, fascia and tendon of lower back, initial encounter: Secondary | ICD-10-CM | POA: Diagnosis present

## 2019-08-23 DIAGNOSIS — J810 Acute pulmonary edema: Secondary | ICD-10-CM | POA: Diagnosis present

## 2019-08-23 DIAGNOSIS — R0902 Hypoxemia: Secondary | ICD-10-CM | POA: Diagnosis not present

## 2019-08-23 DIAGNOSIS — M549 Dorsalgia, unspecified: Secondary | ICD-10-CM | POA: Diagnosis not present

## 2019-08-23 DIAGNOSIS — S3992XA Unspecified injury of lower back, initial encounter: Secondary | ICD-10-CM | POA: Diagnosis not present

## 2019-08-23 DIAGNOSIS — E1122 Type 2 diabetes mellitus with diabetic chronic kidney disease: Secondary | ICD-10-CM | POA: Diagnosis present

## 2019-08-23 DIAGNOSIS — Z9989 Dependence on other enabling machines and devices: Secondary | ICD-10-CM | POA: Diagnosis not present

## 2019-08-23 DIAGNOSIS — D631 Anemia in chronic kidney disease: Secondary | ICD-10-CM | POA: Diagnosis present

## 2019-08-23 DIAGNOSIS — W010XXA Fall on same level from slipping, tripping and stumbling without subsequent striking against object, initial encounter: Secondary | ICD-10-CM | POA: Diagnosis present

## 2019-08-23 DIAGNOSIS — E662 Morbid (severe) obesity with alveolar hypoventilation: Secondary | ICD-10-CM | POA: Diagnosis present

## 2019-08-23 DIAGNOSIS — M48061 Spinal stenosis, lumbar region without neurogenic claudication: Secondary | ICD-10-CM | POA: Diagnosis present

## 2019-08-23 DIAGNOSIS — Z794 Long term (current) use of insulin: Secondary | ICD-10-CM | POA: Diagnosis not present

## 2019-08-23 DIAGNOSIS — G92 Toxic encephalopathy: Secondary | ICD-10-CM | POA: Diagnosis present

## 2019-08-23 DIAGNOSIS — E872 Acidosis: Secondary | ICD-10-CM | POA: Diagnosis present

## 2019-08-23 DIAGNOSIS — N186 End stage renal disease: Secondary | ICD-10-CM | POA: Diagnosis present

## 2019-08-23 DIAGNOSIS — F329 Major depressive disorder, single episode, unspecified: Secondary | ICD-10-CM | POA: Diagnosis present

## 2019-08-23 DIAGNOSIS — N179 Acute kidney failure, unspecified: Secondary | ICD-10-CM | POA: Diagnosis present

## 2019-08-23 DIAGNOSIS — E8770 Fluid overload, unspecified: Secondary | ICD-10-CM | POA: Diagnosis present

## 2019-08-23 DIAGNOSIS — Z4682 Encounter for fitting and adjustment of non-vascular catheter: Secondary | ICD-10-CM | POA: Diagnosis not present

## 2019-08-23 DIAGNOSIS — G4733 Obstructive sleep apnea (adult) (pediatric): Secondary | ICD-10-CM | POA: Diagnosis not present

## 2019-08-23 DIAGNOSIS — Z9111 Patient's noncompliance with dietary regimen: Secondary | ICD-10-CM | POA: Diagnosis not present

## 2019-08-23 DIAGNOSIS — G9341 Metabolic encephalopathy: Secondary | ICD-10-CM | POA: Diagnosis not present

## 2019-08-23 DIAGNOSIS — I120 Hypertensive chronic kidney disease with stage 5 chronic kidney disease or end stage renal disease: Secondary | ICD-10-CM | POA: Diagnosis present

## 2019-08-23 DIAGNOSIS — J40 Bronchitis, not specified as acute or chronic: Secondary | ICD-10-CM | POA: Diagnosis present

## 2019-08-23 DIAGNOSIS — Z9115 Patient's noncompliance with renal dialysis: Secondary | ICD-10-CM | POA: Diagnosis not present

## 2019-08-23 DIAGNOSIS — Z992 Dependence on renal dialysis: Secondary | ICD-10-CM | POA: Diagnosis not present

## 2019-08-23 DIAGNOSIS — G8929 Other chronic pain: Secondary | ICD-10-CM | POA: Diagnosis present

## 2019-08-23 DIAGNOSIS — Z20828 Contact with and (suspected) exposure to other viral communicable diseases: Secondary | ICD-10-CM | POA: Diagnosis present

## 2019-08-23 DIAGNOSIS — L8990 Pressure ulcer of unspecified site, unspecified stage: Secondary | ICD-10-CM | POA: Insufficient documentation

## 2019-08-23 DIAGNOSIS — B963 Hemophilus influenzae [H. influenzae] as the cause of diseases classified elsewhere: Secondary | ICD-10-CM | POA: Diagnosis present

## 2019-08-23 LAB — COMPREHENSIVE METABOLIC PANEL
ALT: 8 U/L (ref 0–44)
AST: 16 U/L (ref 15–41)
Albumin: 3.8 g/dL (ref 3.5–5.0)
Alkaline Phosphatase: 140 U/L — ABNORMAL HIGH (ref 38–126)
Anion gap: 18 — ABNORMAL HIGH (ref 5–15)
BUN: 71 mg/dL — ABNORMAL HIGH (ref 6–20)
CO2: 22 mmol/L (ref 22–32)
Calcium: 6.5 mg/dL — ABNORMAL LOW (ref 8.9–10.3)
Chloride: 91 mmol/L — ABNORMAL LOW (ref 98–111)
Creatinine, Ser: 14.24 mg/dL — ABNORMAL HIGH (ref 0.61–1.24)
GFR calc Af Amer: 4 mL/min — ABNORMAL LOW (ref >60)
GFR calc non Af Amer: 3 mL/min — ABNORMAL LOW (ref >60)
Glucose, Bld: 140 mg/dL — ABNORMAL HIGH (ref 70–99)
Potassium: 6.1 mmol/L — ABNORMAL HIGH (ref 3.5–5.1)
Sodium: 131 mmol/L — ABNORMAL LOW (ref 135–145)
Total Bilirubin: 0.8 mg/dL (ref 0.3–1.2)
Total Protein: 9.2 g/dL — ABNORMAL HIGH (ref 6.5–8.1)

## 2019-08-23 LAB — BASIC METABOLIC PANEL
Anion gap: 16 — ABNORMAL HIGH (ref 5–15)
BUN: 41 mg/dL — ABNORMAL HIGH (ref 6–20)
CO2: 20 mmol/L — ABNORMAL LOW (ref 22–32)
Calcium: 7.2 mg/dL — ABNORMAL LOW (ref 8.9–10.3)
Chloride: 93 mmol/L — ABNORMAL LOW (ref 98–111)
Creatinine, Ser: 8.79 mg/dL — ABNORMAL HIGH (ref 0.61–1.24)
GFR calc Af Amer: 7 mL/min — ABNORMAL LOW (ref >60)
GFR calc non Af Amer: 6 mL/min — ABNORMAL LOW (ref >60)
Glucose, Bld: 91 mg/dL (ref 70–99)
Potassium: 4.7 mmol/L (ref 3.5–5.1)
Sodium: 129 mmol/L — ABNORMAL LOW (ref 135–145)

## 2019-08-23 LAB — CBC
HCT: 38.2 % — ABNORMAL LOW (ref 39.0–52.0)
Hemoglobin: 11.3 g/dL — ABNORMAL LOW (ref 13.0–17.0)
MCH: 30.1 pg (ref 26.0–34.0)
MCHC: 29.6 g/dL — ABNORMAL LOW (ref 30.0–36.0)
MCV: 101.9 fL — ABNORMAL HIGH (ref 80.0–100.0)
Platelets: 213 10*3/uL (ref 150–400)
RBC: 3.75 MIL/uL — ABNORMAL LOW (ref 4.22–5.81)
RDW: 17.2 % — ABNORMAL HIGH (ref 11.5–15.5)
WBC: 10.4 10*3/uL (ref 4.0–10.5)
nRBC: 0.2 % (ref 0.0–0.2)

## 2019-08-23 LAB — MRSA PCR SCREENING: MRSA by PCR: POSITIVE — AB

## 2019-08-23 LAB — POCT I-STAT 7, (LYTES, BLD GAS, ICA,H+H)
Acid-base deficit: 5 mmol/L — ABNORMAL HIGH (ref 0.0–2.0)
Bicarbonate: 24.8 mmol/L (ref 20.0–28.0)
Calcium, Ion: 0.84 mmol/L — CL (ref 1.15–1.40)
HCT: 38 % — ABNORMAL LOW (ref 39.0–52.0)
Hemoglobin: 12.9 g/dL — ABNORMAL LOW (ref 13.0–17.0)
O2 Saturation: 92 %
Patient temperature: 97.7
Potassium: 5.8 mmol/L — ABNORMAL HIGH (ref 3.5–5.1)
Sodium: 131 mmol/L — ABNORMAL LOW (ref 135–145)
TCO2: 27 mmol/L (ref 22–32)
pCO2 arterial: 70.4 mmHg (ref 32.0–48.0)
pH, Arterial: 7.152 — CL (ref 7.350–7.450)
pO2, Arterial: 80 mmHg — ABNORMAL LOW (ref 83.0–108.0)

## 2019-08-23 LAB — BLOOD GAS, ARTERIAL
Acid-base deficit: 3.2 mmol/L — ABNORMAL HIGH (ref 0.0–2.0)
Bicarbonate: 27.2 mmol/L (ref 20.0–28.0)
FIO2: 36
O2 Saturation: 64.4 %
Patient temperature: 36.8
pCO2 arterial: 112 mmHg (ref 32.0–48.0)
pH, Arterial: 7.013 — CL (ref 7.350–7.450)
pO2, Arterial: 46.6 mmHg — ABNORMAL LOW (ref 83.0–108.0)

## 2019-08-23 LAB — GLUCOSE, CAPILLARY
Glucose-Capillary: 119 mg/dL — ABNORMAL HIGH (ref 70–99)
Glucose-Capillary: 123 mg/dL — ABNORMAL HIGH (ref 70–99)
Glucose-Capillary: 127 mg/dL — ABNORMAL HIGH (ref 70–99)
Glucose-Capillary: 108 mg/dL — ABNORMAL HIGH (ref 70–99)
Glucose-Capillary: 67 mg/dL — ABNORMAL LOW (ref 70–99)
Glucose-Capillary: 95 mg/dL (ref 70–99)
Glucose-Capillary: 93 mg/dL (ref 70–99)
Glucose-Capillary: 94 mg/dL (ref 70–99)

## 2019-08-23 LAB — MAGNESIUM: Magnesium: 2.3 mg/dL (ref 1.7–2.4)

## 2019-08-23 LAB — HEPATITIS B SURFACE ANTIGEN: Hepatitis B Surface Ag: NONREACTIVE

## 2019-08-23 LAB — LACTIC ACID, PLASMA: Lactic Acid, Venous: 1.2 mmol/L (ref 0.5–1.9)

## 2019-08-23 LAB — PHOSPHORUS: Phosphorus: 11.1 mg/dL — ABNORMAL HIGH (ref 2.5–4.6)

## 2019-08-23 MED ORDER — FENTANYL CITRATE (PF) 100 MCG/2ML IJ SOLN
50.0000 ug | INTRAMUSCULAR | Status: DC | PRN
  Administered 2019-08-23: 09:00:00 50 ug via INTRAVENOUS

## 2019-08-23 MED ORDER — HEPARIN SODIUM (PORCINE) 1000 UNIT/ML DIALYSIS
5000.0000 [IU] | Freq: Once | INTRAMUSCULAR | Status: DC
  Filled 2019-08-23: qty 5

## 2019-08-23 MED ORDER — INSULIN ASPART 100 UNIT/ML ~~LOC~~ SOLN
2.0000 [IU] | SUBCUTANEOUS | Status: DC
  Administered 2019-08-24 (×2): 4 [IU] via SUBCUTANEOUS
  Administered 2019-08-24 – 2019-08-25 (×2): 2 [IU] via SUBCUTANEOUS
  Administered 2019-08-25: 4 [IU] via SUBCUTANEOUS
  Administered 2019-08-25: 12:00:00 2 [IU] via SUBCUTANEOUS
  Administered 2019-08-26 (×2): 4 [IU] via SUBCUTANEOUS

## 2019-08-23 MED ORDER — NOREPINEPHRINE 4 MG/250ML-% IV SOLN
INTRAVENOUS | Status: AC
  Filled 2019-08-23: qty 250

## 2019-08-23 MED ORDER — ETOMIDATE 2 MG/ML IV SOLN
20.0000 mg | Freq: Once | INTRAVENOUS | Status: AC
  Administered 2019-08-23: 07:00:00 20 mg via INTRAVENOUS

## 2019-08-23 MED ORDER — PANTOPRAZOLE SODIUM 40 MG PO PACK
40.0000 mg | PACK | Freq: Every day | ORAL | Status: DC
  Administered 2019-08-23: 40 mg
  Filled 2019-08-23: qty 20

## 2019-08-23 MED ORDER — MIDAZOLAM HCL 2 MG/2ML IJ SOLN
1.0000 mg | INTRAMUSCULAR | Status: DC | PRN
  Administered 2019-08-23: 2 mg via INTRAVENOUS
  Filled 2019-08-23: qty 2

## 2019-08-23 MED ORDER — MIDAZOLAM HCL 2 MG/2ML IJ SOLN: 2.0000 mg | INTRAMUSCULAR | Status: DC | PRN

## 2019-08-23 MED ORDER — HEPARIN SODIUM (PORCINE) 1000 UNIT/ML DIALYSIS
6000.0000 [IU] | Freq: Once | INTRAMUSCULAR | Status: DC
  Filled 2019-08-23: qty 6

## 2019-08-23 MED ORDER — FENTANYL CITRATE (PF) 100 MCG/2ML IJ SOLN: 50.0000 ug | Freq: Once | INTRAMUSCULAR | Status: DC

## 2019-08-23 MED ORDER — ETOMIDATE 2 MG/ML IV SOLN
INTRAVENOUS | Status: AC
  Filled 2019-08-23: qty 10

## 2019-08-23 MED ORDER — ETOMIDATE 2 MG/ML IV SOLN
INTRAVENOUS | Status: AC
  Administered 2019-08-23: 07:00:00 20 mg via INTRAVENOUS
  Filled 2019-08-23: qty 10

## 2019-08-23 MED ORDER — DEXTROSE 50 % IV SOLN
25.0000 mL | Freq: Once | INTRAVENOUS | Status: AC
  Administered 2019-08-23: 16:00:00 25 mL via INTRAVENOUS

## 2019-08-23 MED ORDER — MIDAZOLAM HCL 2 MG/2ML IJ SOLN
2.0000 mg | Freq: Once | INTRAMUSCULAR | Status: AC
  Administered 2019-08-23: 2 mg via INTRAVENOUS

## 2019-08-23 MED ORDER — ASPIRIN 81 MG PO CHEW
81.0000 mg | CHEWABLE_TABLET | Freq: Every day | ORAL | Status: DC
  Administered 2019-08-23 – 2019-08-26 (×4): 81 mg via ORAL
  Filled 2019-08-23 (×4): qty 1

## 2019-08-23 MED ORDER — DEXTROSE 50 % IV SOLN
INTRAVENOUS | Status: AC
  Filled 2019-08-23: qty 50

## 2019-08-23 MED ORDER — ETOMIDATE 2 MG/ML IV SOLN
20.0000 mg | Freq: Once | INTRAVENOUS | Status: AC
  Administered 2019-08-23: 06:00:00 20 mg via INTRAVENOUS

## 2019-08-23 MED ORDER — FENTANYL CITRATE (PF) 100 MCG/2ML IJ SOLN
INTRAMUSCULAR | Status: AC
  Filled 2019-08-23: qty 2

## 2019-08-23 MED ORDER — CHLORHEXIDINE GLUCONATE 0.12% ORAL RINSE (MEDLINE KIT)
15.0000 mL | Freq: Two times a day (BID) | OROMUCOSAL | Status: DC
  Administered 2019-08-23 – 2019-08-24 (×3): 15 mL via OROMUCOSAL

## 2019-08-23 MED ORDER — MIDAZOLAM HCL 2 MG/2ML IJ SOLN
INTRAMUSCULAR | Status: AC
  Filled 2019-08-23: qty 2

## 2019-08-23 MED ORDER — NOREPINEPHRINE 4 MG/250ML-% IV SOLN
0.0000 ug/min | INTRAVENOUS | Status: DC
  Administered 2019-08-23: 10:00:00 2 ug/min via INTRAVENOUS

## 2019-08-23 MED ORDER — ORAL CARE MOUTH RINSE
15.0000 mL | OROMUCOSAL | Status: DC
  Administered 2019-08-23 – 2019-08-24 (×9): 15 mL via OROMUCOSAL

## 2019-08-23 MED ORDER — FENTANYL CITRATE (PF) 100 MCG/2ML IJ SOLN
100.0000 ug | Freq: Once | INTRAMUSCULAR | Status: AC
  Administered 2019-08-23: 06:00:00 100 ug via INTRAVENOUS

## 2019-08-23 MED ORDER — FENTANYL 2500MCG IN NS 250ML (10MCG/ML) PREMIX INFUSION
0.0000 ug/h | INTRAVENOUS | Status: DC
  Administered 2019-08-23: 07:00:00 100 ug/h via INTRAVENOUS
  Filled 2019-08-23: qty 250

## 2019-08-23 MED ORDER — MUPIROCIN 2 % EX OINT
1.0000 "application " | TOPICAL_OINTMENT | Freq: Two times a day (BID) | CUTANEOUS | Status: DC
  Administered 2019-08-23 – 2019-08-26 (×7): 1 via NASAL
  Filled 2019-08-23 (×2): qty 22

## 2019-08-23 MED ORDER — FENTANYL 2500MCG IN NS 250ML (10MCG/ML) PREMIX INFUSION
50.0000 ug/h | INTRAVENOUS | Status: DC
  Administered 2019-08-23: 10:00:00 200 ug/h via INTRAVENOUS
  Administered 2019-08-23: 22:00:00 125 ug/h via INTRAVENOUS
  Filled 2019-08-23: qty 250

## 2019-08-23 MED ORDER — FENTANYL CITRATE (PF) 100 MCG/2ML IJ SOLN
100.0000 ug | INTRAMUSCULAR | Status: DC | PRN
  Administered 2019-08-23: 07:00:00 100 ug via INTRAVENOUS

## 2019-08-23 MED ORDER — FENTANYL BOLUS VIA INFUSION
50.0000 ug | INTRAVENOUS | Status: DC | PRN
  Administered 2019-08-23 – 2019-08-24 (×2): 50 ug via INTRAVENOUS
  Filled 2019-08-23: qty 50

## 2019-08-23 MED ORDER — MIDAZOLAM HCL 2 MG/2ML IJ SOLN
2.0000 mg | INTRAMUSCULAR | Status: DC | PRN
  Administered 2019-08-23 (×2): 2 mg via INTRAVENOUS
  Filled 2019-08-23 (×2): qty 2

## 2019-08-23 NOTE — Progress Notes (Addendum)
ABG results gave to MD. RT decreased FiO2, but no other changes made at this time.    Ref. Range 08/23/2019 09:10  pH, Arterial Latest Ref Range: 7.350 - 7.450  7.152 (LL)  pCO2 arterial Latest Ref Range: 32.0 - 48.0 mmHg 70.4 (HH)  pO2, Arterial Latest Ref Range: 83.0 - 108.0 mmHg 80.0 (L)  TCO2 Latest Ref Range: 22 - 32 mmol/L 27  Acid-base deficit Latest Ref Range: 0.0 - 2.0 mmol/L 5.0 (H)  Bicarbonate Latest Ref Range: 20.0 - 28.0 mmol/L 24.8  O2 Saturation Latest Units: % 92.0  Patient temperature Unknown 97.7 F  Collection site Unknown RADIAL, ALLEN'S TEST ACCEPTABLE

## 2019-08-23 NOTE — Progress Notes (Signed)
[facility] Progress Note  Subjective: got 2.5h HD last night, decomp then intubated w/ pCO2 > 100 on abg.  On vent in ICU now.    Vitals:   08/23/19 0600 08/23/19 0614 08/23/19 0635 08/23/19 0736  BP:  (!) 88/50 108/76   Pulse:  73 74   Resp:  17 (!) 21   Temp:    97.8 F (36.6 C)  TempSrc:    Oral  SpO2: 100% 90% 99%   Weight:      Height:        Inpatient medications: . aspirin  81 mg Oral Daily  . atorvastatin  40 mg Oral Daily  . calcium carbonate  2 tablet Oral TID  . chlorhexidine gluconate (MEDLINE KIT)  15 mL Mouth Rinse BID  . Chlorhexidine Gluconate Cloth  6 each Topical Q0600  . Chlorhexidine Gluconate Cloth  6 each Topical Q0600  . diclofenac sodium  2 g Topical QID  . doxercalciferol  1.5 mcg Intravenous Q M,W,F-HD  . etomidate      . fentaNYL      . fentaNYL (SUBLIMAZE) injection  50 mcg Intravenous Once  . ferric citrate  630 mg Oral TID WC  . heparin  5,000 Units Subcutaneous Q8H  . insulin aspart  2-6 Units Subcutaneous Q4H  . mouth rinse  15 mL Mouth Rinse 10 times per day  . midazolam      . pantoprazole sodium  40 mg Per Tube Daily   . fentaNYL infusion INTRAVENOUS 175 mcg/hr (08/23/19 1013)  . norepinephrine (LEVOPHED) Adult infusion 2 mcg/min (08/23/19 1047)   acetaminophen **OR** acetaminophen, fentaNYL, midazolam, midazolam, promethazine, senna-docusate    Exam: General:Well developed, obese male, in no acute distress. Head:Normocephalic, atraumatic, sclera non-icteric, mucus membranes are moist. Neck: JVD not elevated. Chest: CTA bilat Heart:RRR with normal S1, S2 Abdomen: Soft, non-tender, non-distended Musculoskeletal: Strength and tone appear normal for age. Lower extremities:No edema b/l lower extremities Neuro: sedated on the vent Dialysis Access:LUE AVF + bruit, TDC without erythema/drainage   Dialysis: MWF [facility]   4h  12/2.5 bath  RIJ TDC/ LUA AVF  Hep 4500+ 1000/hr Heparin 4500 unit load and 1000  units per hour Epogen 1200units last does 11/6 Hectorol 1.5 mcg IV q HD  Assessment/Plan: 1. Hyperkalemia: K+ 6 today, will have HD again today 2. ESRD:MWF at [facility]. HD 2.5h yest, plan HD again today, UF 3-4L as tolerated. Reportedly has poor compliance with diet, meds and fluid restrictions as an outpatient. 3. Drainage from HD cath:  resolved and blood cultures negative to date. Will not restart antibiotics at this time.  4. Hypertension/volume:BP was high yest but lower today. Admit CXR no edema. 3-4kg up today, got 2L off last night. as above.  5. Anemia:Hemoglobin 11.2. No ESA indicated.  6. Hypocalcemia:Calcium 6.1. Spoke to RN at outpatient HD unit on 11/9, last outpatient Ca was 8.1. RN reports patient is supposed to be tasking 2 Tums in between his meals. Takes auryxia 3 tabs per meal and 2 with snacks. Will restart tums, auryxia and hectorol. 7. Nutrition:Renal diet with fluid restrictions  8.  Hypercarbic resp failure/ OSA/ OSH: per CCM, on vent now. Is on O2 at home as well.      Seth [name] 08/23/2019, 11:01 AM  Iron/TIBC/Ferritin/ %Sat    Component Value Date/Time   IRON 193 (H) 07/04/2015 1800   TIBC 221 (L) 07/04/2015 1800   FERRITIN 129 04/30/2012 1233   IRONPCTSAT 87 (H) 07/04/2015 1800  Recent Labs  Lab 08/23/19 0801 08/23/19 0910  NA 131* 131*  K 6.1* 5.8*  CL 91*  --   CO2 22  --   GLUCOSE 140*  --   BUN 71*  --   CREATININE 14.24*  --   CALCIUM 6.5*  --   PHOS 11.1*  --   ALBUMIN 3.8  --    Recent Labs  Lab 08/23/19 0801  AST 16  ALT 8  ALKPHOS 140*  BILITOT 0.8  PROT 9.2*   Recent Labs  Lab 08/23/19 0801 08/23/19 0910  WBC 10.4  --   HGB 11.3* 12.9*  HCT 38.2* 38.0*  PLT 213  --

## 2019-08-23 NOTE — Progress Notes (Signed)
Initial Nutrition Assessment  RD working remotely.  DOCUMENTATION CODES:   Morbid obesity  INTERVENTION:   Tube feeding recommendations: - Vital 1.5 @ 35 ml/hr (840 ml/day) via OG tube - Pro-stat 60 ml TID  Recommended tube feeding provides 1860 kcal, 147 grams of protein, and 642 ml of H2O.   NUTRITION DIAGNOSIS:   Inadequate oral intake related to inability to eat as evidenced by NPO status.  GOAL:   Provide needs based on ASPEN/SCCM guidelines  MONITOR:   Vent status, Labs, Weight trends, Skin, I & O's  REASON FOR ASSESSMENT:   Ventilator    ASSESSMENT:   53 year old male who presented to the ED on 11/12 with back pain after a fall. PMH of ESRD on HD, T2DM, lumbar discitis.   11/13 - rapid response due to unresponsiveness during HD, transferred to ICU, intubated  OGT placed.  Weight stable over the last year.  EDW: 138.5 kg  Per RN edema assessment, pt with mild pitting generalized edema.  RD will leave TF recommendations.  Patient is currently intubated on ventilator support MV: 10 L/min Temp (24hrs), Avg:98.1 F (36.7 C), Min:97.7 F (36.5 C), Max:98.4 F (36.9 C) BP (cuff): 87/58 MAP (cuff): 68  Drips: Fentanyl: 20 ml/hr Levophed: ordered but not infusing  Medications reviewed and include: calcium carbonate, Hectorol, ferric citrate, SSI, Protonix  Labs reviewed: sodium 131, potassium 6.1, phosphorus 11.1 CBG's: 88-149 x 24 hours  HD net UF 11/13: 1500 ml I/O's: -1.3 L since admit  NUTRITION - FOCUSED PHYSICAL EXAM:  Unable to complete at this time. RD working remotely.  Diet Order:   Diet Order            Diet NPO time specified  Diet effective now              EDUCATION NEEDS:   Not appropriate for education at this time  Skin:  Skin Assessment: Skin Integrity Issues: Skin Integrity Issues: Stage II: right buttocks  Last BM:  08/21/19  Height:   Ht Readings from Last 1 Encounters:  08/23/19 5\' 7"  (1.702 m)     Weight:   Wt Readings from Last 1 Encounters:  08/23/19 (!) 142 kg   EDW: 138.5 kg  Ideal Body Weight:  67.3 kg  BMI:  Body mass index is 49.03 kg/m.  Estimated Nutritional Needs:   Kcal:  [id number]  Protein:  135-168 grams  Fluid:  UOP + 1000 ml    Gaynell Face, MS, RD, LDN Inpatient Clinical Dietitian Pager: [phone] Weekend/After Hours: [phone]

## 2019-08-23 NOTE — Progress Notes (Signed)
NAME:  Seth [name], MRN:  [id number], DOB:  Nov 22, 1965, LOS: 0 ADMISSION DATE:  08/22/2019, Primary: Clinic, [place]:  Back pain   Brief History  53 yo male with PMH significant for ESRD on HD (MWF)(poor compliance), DM type II chronic back pain, OSA, and OAH who presented to the ED 11/12 for evaluation of back pain after tripping over some toys. As his PCP, I had seem him in clinic last week and discontinued chronic opioids due to prior inappropriately neg drug screens (he had been off opioids for a month at that time so was not concerned about withdrawal). At that visit, he was also noted to be hypoxic with with O2 sat 85% at rest and in low 70s with ambulation. Patient refused hospitalization at that time and was subsequently sent home with 2L [place]. Over the following days, patient presented to the ED ~5 times for various complaints and ultimately required HD due to missing his appointments.  On presentation for this admission, patient was complaining of back pain from tripping over toys 2d prior however lumbar xray from ED visit on day prior was neg and pain was paraspinal suggesting a more muscular nature. Patient had again missed his HD appointment on day of admission and was found to be hyperkalemic. Was unable to get another appointment at his regular HD location at [facility] in [place] so was admitted for HD at this hospital. On morning after admission, rapid was called after patient was somnolent and only responding to sternal rub. Patient was reportedly taken to [place] around 0100. Given patient's OSA and predisposition to respiratory failure, CPAP was ordered however, unfortunately, not placed. O2 sats began to drop while in KDU resulting in patient being placed on non-rebreather resulting in O2 sats at 100%. Patient subsequently developed progressive respiratory failure, likely as a result of decreased respiratory drive with the [id number] O2 sats. Rapid was called after  patient was found very somnolent and was subsequently intubated and transferred to the ICU on 11/13.   Subjective  Tx to ICU this am for respiratory failure.  Sedated. Intubated. Increase FiO2 requirement.   Objective   Blood pressure 108/76, pulse 74, temperature 99.3 F (37.4 C), temperature source Oral, resp. rate (!) 21, height 5\' 7"  (1.702 m), weight (!) 142 kg, SpO2 99 %.     Intake/Output Summary (Last 24 hours) at 08/23/2019 1333 Last data filed at 08/23/2019 1200 Gross per 24 hour  Intake 315 ml  Output 1500 ml  Net -1185 ml   Filed Weights   08/22/19 2223 08/23/19 0135 08/23/19 0150  Weight: (!) 142 kg (!) 142 kg (!) 142 kg   Vent Mode: PRVC FiO2 (%):  [60 %-100 %] 60 % Set Rate:  [20 bmp] 20 bmp Vt Set:  [530 mL] 530 mL PEEP:  [5 cmH20-8 cmH20] 8 cmH20 Plateau Pressure:  [28 cmH20] 28 cmH20   Examination: GENERAL: sedated HEENT: ETT CARDIAC: heart RRR. No peripheral edema.  PULMONARY: acyanotic. Distant lung sounds ABDOMEN: bs active  NEURO: sedated.  Consults:  Neph PCCM--primary IM Significant Diagnostic Tests:  CXR 11/11: generalized vascular congestion. No infiltrate or effusion. CXR 11/12: no acute findings. No convincing pulm edema or pneumonia CXR 11/13: new right midlung density suggesting atelectasis vs pleural fluid  11/12 lumbar xray: no acute fracture. L4-L5 anterolithesis and facet degeneration as seen on previous films  Micro Data:  11/9 blood cultures NGTD 11/13 blood cultures>> 11/13 resp culture>>  Labs  CBC Latest Ref Rng & Units 08/23/2019 08/23/2019 08/22/2019  WBC 4.0 - 10.5 K/uL - 10.4 10.4  Hemoglobin 13.0 - 17.0 g/dL 12.9(L) 11.3(L) 11.2(L)  Hematocrit 39.0 - 52.0 % 38.0(L) 38.2(L) 37.6(L)  Platelets 150 - 400 K/uL - 213 221   BMP Latest Ref Rng & Units 08/23/2019 08/23/2019 08/22/2019  Glucose 70 - 99 mg/dL - 140(H) 143(H)  BUN 6 - 20 mg/dL - 71(H) 97(H)  Creatinine 0.61 - 1.24 mg/dL - 14.24(H) 16.75(H)  Sodium  135 - 145 mmol/L 131(L) 131(L) 130(L)  Potassium 3.5 - 5.1 mmol/L 5.8(H) 6.1(H) 6.5(HH)  Chloride 98 - 111 mmol/L - 91(L) 90(L)  CO2 22 - 32 mmol/L - 22 20(L)  Calcium 8.9 - 10.3 mg/dL - 6.5(L) 6.1(LL)   ABG prior to intubation: pH 7.013, pCO2 112, pO2 47  Summary  53 yo male with ESRD on HD, OSA, OAH, and chronic back pain presenting with muscular back pain and found to be hyperkalemic requiring admission for HD after missing his regularly scheduled appointment. Patient developed respiratory failure during his HD session on night of admission and was subsequently intubated and transferred to critical care for further management.  Assessment & Plan:  Active Problems:   Muscle pain, lumbar   Acute kidney injury ([place])   Pressure injury of skin  ESRD on HD (MWF). HD last night. 1.5L taken off. Management per nephrology. Repeating HD today. Acute on chronic respiratory failure. OSA. OAH. Intubated 11/13. Acute component likely related to volume status however he does also have significant risk factors for vent dependence given his body habitus and OSA.  Respiratory acidosis. Likely 2/2 depressed respiratory drive in setting of elevated O2 sats/nonrebreather. Plan Vent management per PCCM  Fluid management per nephrology  Muscular lumbar back pain. Chronic back pain. Xray from 11/12 without acute findings. Previous MRI L spine in Feb 2020 significant for moderate canal stenosis, severe foraminal narrowing, and anterior lithesis of L5-S1,. No red flag sx. Stated to nephrology that this is his chronic pain.  Plan: conservative management. Heading pad. voltaren gel.  Type 2 DM. Lantus 10U. SSI. CBGs qid.  Best practice:  CODE STATUS: FULL Diet: NPO DVT for prophylaxis: heparin Dispo: PCCM. Unlikely to be weaned from vent over next few days however will continue to follow remotely  Mitzi Hansen, MD [place] PGY-1 PAGER #: [phone] 08/23/19 1:33 PM  No charge note

## 2019-08-23 NOTE — Progress Notes (Signed)
Went to patient room to see about wearing CPAP.  Was told by his RN that patient was in fluid overload and has been sent to dialysis and will be there for about 4 hours.  RN informed me that she would call if patient got back early and wanted to use a CPAP.

## 2019-08-23 NOTE — Progress Notes (Signed)
Report given to HD RN 

## 2019-08-23 NOTE — Significant Event (Deleted)
Rapid Response Event Note  Overview:Called d/t unresponsiveness.  Time Called: 0421 Arrival Time: 0424 Event Type: Neurologic  Initial Focused Assessment: Pt laying in bed in hemodialysis, snoring respirations, responds only to sternal rub. Pt will open eyes and follow simple commands with repeated stimulation, however, goes back to sleep very quickly. Pupils 2 and equal. Skin cool and diaphoretic. Lungs diminished t/o.  T-97.7, HR-80, BP-84/50, RR-20, SpO2-100% on NRB, CBG-119. Per HD RN, pt came to HD alert and oriented on 4L  [place] with SpO2-92%. At the beginning of HD tx, pt's SpO2 dropped to 87% and pt was placed on NRB. Pt has been on HD and NRB for 2.5 hours.  Pt has order for CPAP at night.  Interventions: NRB changed to 4L [place] with SpO2-94% ABG-7.01/112/46.6/27.2 Bipap Tx to ICU(3M03) Plan of Care (if not transferred):  Event Summary:   at  Dr. Sheppard Coil @ [phone]    at          [facility]

## 2019-08-23 NOTE — Progress Notes (Signed)
Spoke w/ pts sister Zella Ball.  Relayed that pt became minimally responsive in HD, was transferred to ICU and intubated. Provided unit number. Zella Ball appreciative and states understanding.

## 2019-08-23 NOTE — Progress Notes (Signed)
PT Cancellation Note  Patient Details Name: Seth [name] MRN: [id number] DOB: 1966-04-24   Cancelled Treatment:    Reason Eval/Treat Not Completed: (P) Patient not medically ready Pt as intubated this morning and is not appropriate for Evaluation today. PT will follow back tomorrow.  Morgen Ritacco B. Migdalia Dk PT, DPT Acute Rehabilitation Services Pager [phone] Office [phone]    [place] 08/23/2019, 8:35 AM

## 2019-08-23 NOTE — Progress Notes (Signed)
NAME:  Seth [name], MRN:  [id number], DOB:  03/03/1966, LOS: 0 ADMISSION DATE:  08/22/2019, CONSULTATION DATE:  08/23/2019 REFERRING MD:  Dr. Heber Laurel, CHIEF COMPLAINT:  Hypercarbia Respiratory Failure    History of present illness   53 year old male presents to ED on 11/12 after falling over his children's toys 3 days ago. Reported that due to pain he missed his dialysis session 11/11. Recently placed on supplemental oxygen. In ED K 6.4, Crt 15.21. Gap 18. HD catheter noted to have small amount of purulent drainage from catheter site, went to IR for possible exchange however catheter appeared clean, non-painful, not redness. Nephrology consulted. Plans for emergent dialysis. Taken to Dialysis, 2.5 hours into session was noted to have snoring respirations, minimally responsive to sternal rub. ABG 7.013/112/46.6. Critical Care consulted.   Past Medical History  DM, ESRD (MWF), Lumbar Discitis, OHS (on CPAP at HS)  Significant Hospital Events   11/12 > Presents to ED  11/13 > Transferred to ICU  Consults:  Nephrology  PCCM  Procedures:  ETT 11/13 >>   Significant Diagnostic Tests:  CXR 11/12 > . No acute findings. No convincing pulmonary edema and no evidence of pneumonia. Stable mild cardiomegaly.  Micro Data:  Blood 11/9 > Negative  Blood 11/13 >> Sputum 11/13 >>   Antimicrobials:  N/A   Interim history/subjective:  Calm overnight, currently fentanyl 175  Objective   Blood pressure 108/76, pulse 74, temperature 97.7 F (36.5 C), temperature source Oral, resp. rate (!) 21, height 5\' 7"  (1.702 m), weight (!) 142 kg, SpO2 99 %.    Vent Mode: PRVC FiO2 (%):  [100 %] 100 % Set Rate:  [20 bmp] 20 bmp Vt Set:  [530 mL] 530 mL PEEP:  [5 cmH20] 5 cmH20 Plateau Pressure:  [28 cmH20] 28 cmH20   Intake/Output Summary (Last 24 hours) at 08/23/2019 0908 Last data filed at 08/23/2019 0430 Gross per 24 hour  Intake 120 ml  Output 1500 ml  Net -1380 ml   Filed Weights   08/22/19 2223 08/23/19 0135 08/23/19 0150  Weight: (!) 142 kg (!) 142 kg (!) 142 kg    Examination: General: Obese man, lying in bed HENT: ET tube in place, oropharynx moist, pupils equal Lungs: Bilateral inspiratory crackles, no wheeze, breathing the set rate on mechanical ventilation Cardiovascular: Regular, distant, no murmur Abdomen: Obese, lower abdominal hernia, positive bowel sounds Extremities: 1+ edema Neuro: Awake, nods to questions, strong cough, moves all extremities spontaneously  Resolved Hospital Problem list     Assessment & Plan:   Acute on chronic combined respiratory failure.  Bilateral pulmonary infiltrates following missed outpatient HD.  History of OSA usually on CPAP, decompensated during urgent HD 11/12 -ABG this morning, repeat chest x-ray this morning -Continue PRVC 8 cc/kg, begin to wean FiO2.  Continue PEEP 8 -VAP prevention orders -Respiratory culture obtained, follow results.  No clear evidence infection at this time  End-stage renal disease on HD.  Missed session HD 11/11 Hyperkalemia and anion gap metabolic acidosis -Appreciate nephrology assistance, underwent urgent HD early 11/13, 1.2 L removed -Follow BMP, potassium.  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  Disposition: ICU  Labs   CBC: Recent Labs  Lab 08/18/19 2121 08/19/19 0844 08/19/19 0933 08/21/19 1922 08/22/19 0922 08/23/19 0801  WBC 12.7*  --   --  10.1 10.4 10.4  NEUTROABS  --   --   --   --  8.5*  --   HGB 11.5* 13.3 13.3 10.9* 11.2* 11.3*  HCT 39.2 39.0 39.0 36.8* 37.6* 38.2*  MCV 103.7*  --   --  101.4* 101.6* 101.9*  PLT 219  --   --  217 221 [id number]    Basic Metabolic Panel: Recent Labs  Lab 08/18/19 2121  08/19/19 0933 08/19/19 1128 08/19/19 1835 08/21/19 1922 08/22/19 0904 08/23/19 0801  NA 134*   < > 132*  --  134* 131* 130* 131*  K 6.2*   < > 6.9* >7.5* 5.1 6.4* 6.5* 6.1*  CL 93*  --   --   --  94* 91* 90* 91*  CO2 22  --   --   --  25 22 20* 22  GLUCOSE 138*  --   --   --  90 109* 143* 140*  BUN 91*  --   --   --  52* 88* 97* 71*  CREATININE 14.51*  --   --   --  10.14* 15.21* 16.75* 14.24*  CALCIUM 6.4*  --   --   --  7.8* 6.2* 6.1* 6.5*  MG  --   --   --   --   --   --   --  2.3  PHOS  --   --   --   --  6.8*  --   --  11.1*   < > = values in this interval not displayed.   GFR: Estimated Creatinine Clearance: 8 mL/min (A) (by C-G formula based on SCr of 14.24 mg/dL (H)). Recent Labs  Lab 08/18/19 2121 08/21/19 1922 08/22/19 0922 08/23/19 0738 08/23/19 0801  WBC 12.7* 10.1 10.4  --  10.4  LATICACIDVEN  --   --   --  1.2  --     Liver Function Tests: Recent Labs  Lab 08/18/19 2121 08/19/19 1835 08/23/19 0801  AST 11*  --  16  ALT 9  --  8  ALKPHOS 140*  --  140*  BILITOT 0.6  --  0.8  PROT 9.4*  --  9.2*  ALBUMIN 3.9 4.0 3.8   Recent Labs  Lab 08/18/19 2121  LIPASE 85*   No results for input(s): AMMONIA in the last 168 hours.  ABG    Component Value Date/Time   PHART 7.013 (LL) 08/23/2019 0510   PCO2ART 112 (HH) 08/23/2019 0510   PO2ART 46.6 (L)  08/23/2019 0510   HCO3 27.2 08/23/2019 0510   TCO2 26 08/19/2019 0933   ACIDBASEDEF 3.2 (H) 08/23/2019 0510   O2SAT 64.4 08/23/2019 0510     Coagulation Profile: No results for input(s): INR, PROTIME in the last 168 hours.  Cardiac Enzymes: No results for input(s): CKTOTAL, CKMB, CKMBINDEX, TROPONINI in the last 168 hours.  HbA1C: Hemoglobin A1C  Date/Time Value Ref Range  Status  08/15/2019 02:41 PM 7.0 (A) 4.0 - 5.6 % Final  04/29/2019 03:32 PM 9.1 (A) 4.0 - 5.6 % Final  01/16/2017 8.1  Final   Hgb A1c MFr Bld  Date/Time Value Ref Range Status  07/03/2015 02:55 PM 8.0 (H) 4.8 - 5.6 % Final    Comment:    (NOTE)         Pre-diabetes: 5.7 - 6.4         Diabetes: >6.4         Glycemic control for adults with diabetes: <7.0   04/30/2012 12:33 PM 5.8 (H) <5.7 % Final    Comment:    (NOTE)                                                                       According to the ADA Clinical Practice Recommendations for 2011, when HbA1c is used as a screening test:  >=6.5%   Diagnostic of Diabetes Mellitus           (if abnormal result is confirmed) 5.7-6.4%   Increased risk of developing Diabetes Mellitus References:Diagnosis and Classification of Diabetes Mellitus,Diabetes [id number] 1):S62-S69 and Standards of Medical Care in         Diabetes - 2011,Diabetes Care,2011,34 (Suppl 1):S11-S61.    CBG: Recent Labs  Lab 08/22/19 1727 08/22/19 1835 08/22/19 2228 08/23/19 0329 08/23/19 0532  GLUCAP 88 97 149* 119* 123*     Critical care time: 34 minutes      Baltazar Apo, MD, PhD 08/23/2019, 9:32 AM [facility] [phone] or if no answer [phone]

## 2019-08-23 NOTE — Progress Notes (Signed)
Admitted to 5M18 via wheelchair from ED. Patient having difficulty standing and required (2) assists and slid onto bed. Patient dyspneic on exertion. Pulse ox 85% on 2 L nasal cannula. Increased to 4 L to maintain sats > 92%. VS and assessment as charted. Belongings (shorts,T shirt, jacket,socks,shoes and wallet with $349.00,license) with patient. Patient refused to have money taken to Security and secured. Instructed patient [id number] Policy for valuables. Patient stated he understood and was keeping valuables at bedside.

## 2019-08-23 NOTE — Progress Notes (Signed)
OT Cancellation Note  Patient Details Name: Seth [name] MRN: [id number] DOB: May 25, 1966   Cancelled Treatment:    Reason Eval/Treat Not Completed: Patient not medically ready(intubated this morning) OT to continue to evaluate for most appropriate time  Billey Chang, OTR/L  Acute Rehabilitation Services Pager: [phone] Office: [phone] .  08/23/2019, 8:00 AM

## 2019-08-23 NOTE — TOC Progression Note (Signed)
Transition of Care [facility]) - Progression Note    Patient Details  Name: Seth [name] MRN: [id number] Date of Birth: 1966-03-29  [facility] CM/SW Contact  Bartholomew Crews, RN Phone Number: [phone] 08/23/2019, 12:49 PM  Clinical Narrative:    Received call back from [facility] - Scientist, research (life sciences) at [facility]. Patient is a English as a second language teacher at [employer]. PCP is Dr. Collins [place], and social worker is Kellie Simmering [phone].   Patient remains on the vent. Pending HD today. Noted on fentanyl, but levo off at the moment.   Following for transitions.        Expected Discharge Plan and Services                                                 Social Determinants of Health (SDOH) Interventions    Readmission Risk Interventions No flowsheet data found.

## 2019-08-23 NOTE — Progress Notes (Signed)
Pts sister at bedside visiting, will take pts wallet and phone w/ her when she leaves.

## 2019-08-23 NOTE — Procedures (Signed)
Endotracheal Intubation Procedure Note  Indication for endotracheal intubation: respiratory failure. Airway Assessment: Mallampati Class: IV (only hard palate visible). Sedation: etomidate. Paralytic: none. Lidocaine: no. Atropine: no. Equipment: Macintosh 3 laryngoscope blade. Cricoid Pressure: no. Number of attempts: 2. ETT location confirmed by by auscultation and ETCO2 monitor  Difficult intubation, cuff ruptured on first attempt, reintubated over [place] with glidescope guidance.  Position confirmed by bronchoscope.  Shellia Cleverly 08/23/2019

## 2019-08-23 NOTE — TOC Initial Note (Signed)
Transition of Care [facility]) - Initial/Assessment Note    Patient Details  Name: Seth [name] MRN: [id number] Date of Birth: June 07, 1966  Transition of Care [facility]) CM/SW Contact:    Bartholomew Crews, RN Phone Number: [phone] 08/23/2019, 8:25 AM  Clinical Narrative:                 TOC following for transition needs. Chart reviewed - noted patient rapid responsed to ICU for intub. PTA home with family and gets HD MWF at [facility]. PCP at [facility] - LVM for transfer coordinator at [facility] of patient admission - NCM provided f/u.        Patient Goals and CMS Choice        Expected Discharge Plan and Services                                                Prior Living Arrangements/Services                       Activities of Daily Living Home Assistive Devices/Equipment: Oxygen, CPAP ADL Screening (condition at time of admission) Patient's cognitive ability adequate to safely complete daily activities?: Yes Is the patient deaf or have difficulty hearing?: No Does the patient have difficulty seeing, even when wearing glasses/contacts?: No Does the patient have difficulty concentrating, remembering, or making decisions?: Yes Patient able to express need for assistance with ADLs?: No Does the patient have difficulty dressing or bathing?: Yes Independently performs ADLs?: Yes (appropriate for developmental age) Does the patient have difficulty walking or climbing stairs?: Yes Weakness of Legs: Both Weakness of Arms/Hands: Both  Permission Sought/Granted                  Emotional Assessment              Admission diagnosis:  ESRD (end stage renal disease) on dialysis ([place]) [N18.6, Z99.2] Patient Active Problem List   Diagnosis Date Noted  . Pressure injury of skin 08/23/2019  . Acute kidney injury ([place]) 08/22/2019  . Chronic hypoxemic respiratory failure ([place])   . Hypoxia 08/16/2019  . Eczema 08/16/2019  . Chronic, continuous use  of opioids 08/16/2019  . [place] 04/30/2019  . NSTEMI (non-ST elevated myocardial infarction) ([place]) 11/29/2018  . Hyponatremia 11/29/2018  . Hyperkalemia 09/03/2018  . Nightmares 12/28/2017  . Preventative health care 10/01/2015  . 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 Muscle pain, lumbar 08/11/2015  . Controlled type 2 diabetes mellitus with chronic kidney disease on chronic dialysis ([place]) 07/20/2015  . Absolute anemia   . OSA (obstructive sleep apnea) 10/14/2013  . End stage renal disease on dialysis ([place]) 04/30/2012  . Hyperlipidemia associated with type 2 diabetes mellitus ([place]) 06/20/2007  . Obesity hypoventilation syndrome ([place]) 06/20/2007  . Hypertension associated with diabetes ([place]) 06/20/2007   PCP:  Clinic, [place]:   [facility] [place], [address] [place] [place] [address] Phone: [phone] Fax: [place], [place] - [address] [facility]. [address] [place] [address] Phone: [phone] Fax: [phone]     Social Determinants of Health (SDOH) Interventions    Readmission Risk Interventions No flowsheet data found.

## 2019-08-23 NOTE — Progress Notes (Signed)
Wallet containing $349.00 given to Courtney,RN ON 53m.

## 2019-08-23 NOTE — Progress Notes (Addendum)
Pt on hemodialysis 2.5 hrs. Pt taking off 1.5 L.Pt snoring respirations, responds only to sternal rub. Pt will open eyes and follow simple commands with repeated stimulation, however, goes back to sleep very quickly. Pt skin cool and diaphoretic. Dr. Marval Regal notified. Tx terminated and rapid response called. Pt waiting for a ICU room. Pt T 97.7 BP 95/47. HR  80. O2 sat 100 % on NRB.CBG checked: 119. 123 . Pt on BiPAP now. Report given to [facility]. Rodena Piety RN.

## 2019-08-23 NOTE — Significant Event (Addendum)
Rapid Response Event Note  Overview:Called d/t unresponsiveness.  Time Called: 0421 Arrival Time: 0424 Event Type: Neurologic  Initial Focused Assessment: Pt laying in bed in hemodialysis, snoring respirations, responds only to sternal rub. Pt will open eyes and follow simple commands with repeated stimulation, however, goes back to sleep very quickly. Pupils 2 and equal. Skin cool and diaphoretic. Lungs diminished t/o.  T-97.7, HR-80, BP-84/50, RR-20, SpO2-100% on NRB, CBG-119. Per HD RN, pt came to HD alert and oriented on 4L  [place] with SpO2-92%. At the beginning of HD tx, pt's SpO2 dropped to 87% and pt was placed on NRB. Pt has been on HD and NRB for 2.5 hours.  Pt has order for CPAP at night.  Interventions: ABG-7.01/113/74/26 NRB changed to 4L [place] with SpO2-94% Bipap Tx to ICU Plan of Care (if not transferred):  Event Summary:   at  Dr. Sheppard Coil @ [phone]    at          [facility]

## 2019-08-23 NOTE — Progress Notes (Signed)
[facility] Progress Note Patient Name: Seth [name] DOB: 05-Sep-1966 MRN: [id number]   Date of Service  08/23/2019  HPI/Events of Note  Pt with ESRD-DD and chronic respiratory failure who developed acute on chronic hypercapnic respiratory failure likely secondary to oxygen toxicity, he was transferred to the ICU and intubated.  eICU Interventions  New patient evaluation completed.        Kerry Kass Ogan 08/23/2019, 6:40 AM

## 2019-08-23 NOTE — Consult Note (Addendum)
NAME:  Seth [name], MRN:  [id number], DOB:  1965/12/05, LOS: 0 ADMISSION DATE:  08/22/2019, CONSULTATION DATE:  08/23/2019 REFERRING MD:  Dr. Heber Lookeba, CHIEF COMPLAINT:  Hypercarbia Respiratory Failure    History of present illness   53 year old male presents to ED on 11/12 after falling over his children's toys 3 days ago. Reported that due to pain he missed his dialysis session 11/11. Recently placed on supplemental oxygen. In ED K 6.4, Crt 15.21. Gap 18. HD catheter noted to have small amount of purulent drainage from catheter site, went to IR for possible exchange however catheter appeared clean, non-painful, not redness. Nephrology consulted. Plans for emergent dialysis. Taken to Dialysis, 2.5 hours into session was noted to have snoring respirations, minimally responsive to sternal rub. ABG 7.013/112/46.6. Critical Care consulted.   Past Medical History  DM, ESRD (MWF), Lumbar Discitis, OHS (on CPAP at HS)  Significant Hospital Events   11/12 > Presents to ED  11/13 > Transferred to ICU  Consults:  Nephrology  PCCM  Procedures:  ETT 11/13 >>   Significant Diagnostic Tests:  CXR 11/12 > . No acute findings. No convincing pulmonary edema and no evidence of pneumonia. Stable mild cardiomegaly.  Micro Data:  Blood 11/9 > Negative  Blood 11/13 >> Sputum 11/13 >>   Antimicrobials:  N/A   Interim history/subjective:  As above   Objective   Blood pressure (!) 94/47, pulse 84, temperature 97.7 F (36.5 C), temperature source Oral, resp. rate 20, height 5\' 7"  (1.702 m), weight (!) 142 kg, SpO2 100 %.        Intake/Output Summary (Last 24 hours) at 08/23/2019 0559 Last data filed at 08/23/2019 0430 Gross per 24 hour  Intake 120 ml  Output 1500 ml  Net -1380 ml   Filed Weights   08/22/19 2223 08/23/19 0135 08/23/19 0150  Weight: (!) 142 kg (!) 142 kg (!) 142 kg    Examination: General: Adult male, lying in bed  HENT: ETT in place  Lungs: diminished breath sounds  to bases, fine crackles noted   Cardiovascular: RRR, no MRG Abdomen: Obese, active bowel sounds, hernia noted to mid lower ABD Extremities: +1 generalized edema  Neuro: does not open eyes, does not follow commands, moves lower extremities spontaneously  GU: intact   Resolved Hospital Problem list     Assessment & Plan:   Encephalopathy in setting of hypercarbic state Plan -Treatment as below   Acute on Chronic Hypercarbic/Hypoxic Respiratory Distress H/O OSA on CPAP, 2-4L   -COVID negative 11/12  Plan -Intubate -Trend ABG/CXR -Pulmonary Hygiene/VAP bundle   Hyperkalemia  Anion Gap Metabolic Acidosis  ESRD with HD MWF Plan  -Per Nephrology  -underwent 2.5 hours dialysis 11/13 -Trend BMP >> Obtain now  DM Plan -Trend Glucose -SSI   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  Plan -Trend CBC -Heparin sq for DVT ppx  Best practice:  Diet: NPO Pain/Anxiety/Delirium protocol (if indicated) VAP protocol (if indicated) DVT prophylaxis: Heparin  GI prophylaxis: PPI Glucose control: SSI Mobility: Bedrest Code Status: FC Family Communication: Family updated on plan of care  Disposition: continue ICU care   Labs   CBC: Recent Labs  Lab 08/18/19 2121 08/19/19 0844 08/19/19 0933 08/21/19 1922 08/22/19 0922  WBC 12.7*  --   --  10.1 10.4  NEUTROABS  --   --   --   --  8.5*  HGB 11.5* 13.3 13.3 10.9* 11.2*  HCT 39.2 39.0 39.0 36.8* 37.6*  MCV  103.7*  --   --  101.4* 101.6*  PLT 219  --   --  217 [id number]    Basic Metabolic Panel: Recent Labs  Lab 08/18/19 2121 08/19/19 0844  08/19/19 0933 08/19/19 1128 08/19/19 1835 08/21/19 1922 08/22/19 0904  NA 134* 131*  --  132*  --  134* 131* 130*  K 6.2* 7.6*   < > 6.9* >7.5* 5.1 6.4* 6.5*  CL 93*  --   --   --   --  94* 91* 90*  CO2 22  --   --   --   --  25 22 20*  GLUCOSE 138*  --   --   --   --  90 109* 143*  BUN 91*  --   --   --   --  52* 88* 97*  CREATININE 14.51*  --   --   --   --  10.14* 15.21*  16.75*  CALCIUM 6.4*  --   --   --   --  7.8* 6.2* 6.1*  PHOS  --   --   --   --   --  6.8*  --   --    < > = values in this interval not displayed.   GFR: Estimated Creatinine Clearance: 6.8 mL/min (A) (by C-G formula based on SCr of 16.75 mg/dL (H)). Recent Labs  Lab 08/18/19 2121 08/21/19 1922 08/22/19 0922  WBC 12.7* 10.1 10.4    Liver Function Tests: Recent Labs  Lab 08/18/19 2121 08/19/19 1835  AST 11*  --   ALT 9  --   ALKPHOS 140*  --   BILITOT 0.6  --   PROT 9.4*  --   ALBUMIN 3.9 4.0   Recent Labs  Lab 08/18/19 2121  LIPASE 85*   No results for input(s): AMMONIA in the last 168 hours.  ABG    Component Value Date/Time   PHART 7.101 (LL) 08/19/2019 0933   PCO2ART 76.4 (HH) 08/19/2019 0933   PO2ART 74.0 (L) 08/19/2019 0933   HCO3 23.9 08/19/2019 0933   TCO2 26 08/19/2019 0933   ACIDBASEDEF 7.0 (H) 08/19/2019 0933   O2SAT 88.0 08/19/2019 0933     Coagulation Profile: No results for input(s): INR, PROTIME in the last 168 hours.  Cardiac Enzymes: No results for input(s): CKTOTAL, CKMB, CKMBINDEX, TROPONINI in the last 168 hours.  HbA1C: Hemoglobin A1C  Date/Time Value Ref Range Status  08/15/2019 02:41 PM 7.0 (A) 4.0 - 5.6 % Final  04/29/2019 03:32 PM 9.1 (A) 4.0 - 5.6 % Final  01/16/2017 8.1  Final   Hgb A1c MFr Bld  Date/Time Value Ref Range Status  07/03/2015 02:55 PM 8.0 (H) 4.8 - 5.6 % Final    Comment:    (NOTE)         Pre-diabetes: 5.7 - 6.4         Diabetes: >6.4         Glycemic control for adults with diabetes: <7.0   04/30/2012 12:33 PM 5.8 (H) <5.7 % Final    Comment:    (NOTE)  According to the ADA Clinical Practice Recommendations for 2011, when HbA1c is used as a screening test:  >=6.5%   Diagnostic of Diabetes Mellitus           (if abnormal result is confirmed) 5.7-6.4%   Increased risk of developing Diabetes Mellitus References:Diagnosis and  Classification of Diabetes Mellitus,Diabetes [id number] 1):S62-S69 and Standards of Medical Care in         Diabetes - 2011,Diabetes [id number] (Suppl 1):S11-S61.    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   Surgical History    Past Surgical History:  Procedure Laterality Date   A/V FISTULAGRAM N/A 10/18/2018   Procedure: A/V FISTULAGRAM;  Surgeon: Marty Heck, MD;  Location: [place] [facility];  Service: Cardiovascular;  Laterality: N/A;   A/V FISTULAGRAM Left 03/28/2019   Procedure: A/V Fistulagram;  Surgeon: Marty Heck, MD;  Location: [place] [facility];  Service: Cardiovascular;  Laterality: Left;   AV FISTULA PLACEMENT  05/03/2012   Procedure: ARTERIOVENOUS (AV) FISTULA CREATION;  Surgeon: Mal [name], MD;  Location: [place];  Service: Vascular;  Laterality: Right;   AV FISTULA PLACEMENT Left 10/23/2018   Procedure: ARTERIOVENOUS (AV) FISTULA CREATION;  Surgeon: Waynetta Sandy, MD;  Location: [place];  Service: Vascular;  Laterality: Left;   BASCILIC VEIN TRANSPOSITION Left 04/02/2019   Procedure: BASILIC VEIN TRANSPOSITION SECOND STAGE LEFT;  Surgeon: Waynetta Sandy, MD;  Location: [place];  Service: Vascular;  Laterality: Left;   INSERTION OF DIALYSIS CATHETER Left 12/03/2018   Procedure: INSERTION OF DIALYSIS CATHETER;  Surgeon: Marty Heck, MD;  Location: [place];  Service: Vascular;  Laterality: Left;   INSERTION OF DIALYSIS CATHETER Left 06/03/2019    Procedure: INSERTION OF DIALYSIS CATHETER;  Surgeon: Marty Heck, MD;  Location: [place];  Service: Vascular;  Laterality: Left;   ORIF MANDIBULAR FRACTURE N/A 11/10/2017   Procedure: OPEN REDUCTION INTERNAL FIXATION (ORIF) MANDIBULAR FRACTURE POSSIBLE MAXILLARY MANDIBULAR FIXATION;  Surgeon: Jodi Marble, MD;  Location: [place];  Service: ENT;  Laterality: N/A;   REVISON OF ARTERIOVENOUS FISTULA Right 05/31/2018   Procedure: REVISION OF ARTERIOVENOUS FISTULA  RIGHT ARM WITH INTERPOSITION ARTEGRAFT 6MM X 16CM GRAFT;  Surgeon: Waynetta Sandy, MD;  Location: [employer];  Service: Vascular;  Laterality: Right;   TEE WITHOUT CARDIOVERSION N/A 07/10/2015   Procedure: TRANSESOPHAGEAL ECHOCARDIOGRAM (TEE);  Surgeon: Josue Hector, MD;  Location: [facility] ENDOSCOPY;  Service: Cardiovascular;  Laterality: N/A;   TEE WITHOUT CARDIOVERSION N/A 12/07/2018   Procedure: TRANSESOPHAGEAL ECHOCARDIOGRAM (TEE);  Surgeon: Acie Fredrickson Wonda Cheng, MD;  Location: [place];  Service: Cardiovascular;  Laterality: N/A;   TEE WITHOUT CARDIOVERSION N/A 06/03/2019   Procedure: Transesophageal Echocardiogram (Tee);  Surgeon: Acie Fredrickson Wonda Cheng, MD;  Location: [place];  Service: Cardiovascular;  Laterality: N/A;   tracheostomy removal     TRACHEOSTOMY TUBE PLACEMENT N/A 08/20/2013   Procedure: TRACHEOSTOMY Revision;  Surgeon: Melida Quitter, MD;  Location: [place];  Service: ENT;  Laterality: N/A;   UPPER EXTREMITY VENOGRAPHY Bilateral 03/28/2019   Procedure:  UPPER EXTREMITY VENOGRAPHY;  Surgeon: Marty Heck, MD;  Location: [place] [facility];  Service: Cardiovascular;  Laterality: Bilateral;     Social History   reports that he has never smoked. He has never used smokeless tobacco. He reports previous drug use. Drug: Marijuana. He reports that he does not drink alcohol.   Family History   His family history includes Cancer in his mother; Diabetes in his brother, mother, and sister.   Allergies No Known Allergies    Home Medications  Prior to Admission medications   Medication Sig Start Date End Date Taking? Authorizing Provider  amLODipine (NORVASC) 10 MG tablet Take 1 tablet (10 mg total) by mouth daily. 08/21/19   Mitzi Hansen, MD  aspirin EC 81 MG tablet Take 81 mg by mouth daily.    [provider]  atorvastatin (LIPITOR) 40 MG tablet Take 40 mg by mouth daily.    [provider]  diclofenac sodium (VOLTAREN) 1 % GEL Apply 4 g topically 4 (four) times daily.    [provider]  ferric citrate (AURYXIA) 1 GM 210 MG(Fe) tablet Take 420-630 mg by mouth See admin instructions. Take 3 tablets (630 mg) by mouth with meals and 2 tablets (420 mg) with snacks    [provider]  insulin aspart (NOVOLOG FLEXPEN) 100 UNIT/ML FlexPen Inject 0-20 Units into the skin 3 (three) times daily with meals.    [provider]  insulin degludec (TRESIBA FLEXTOUCH) 100 UNIT/ML SOPN FlexTouch Pen Inject 0.14 mLs (14 Units total) into the skin daily. 05/13/19   Mitzi Hansen, MD  oxyCODONE-acetaminophen (PERCOCET) 5-325 MG tablet Take 1 tablet by mouth every 6 (six) hours as needed for up to 3 days. 08/21/19 08/24/19  Charlesetta Shanks, MD  Semaglutide,0.25 or 0.5MG /DOS, (OZEMPIC, 0.25 OR 0.5 MG/DOSE,) 2 MG/1.5ML SOPN Inject 0.25 mg into the skin once a week. Patient taking differently: Inject 0.25 mg into the skin once a week. Sundays 06/20/19   Forde Dandy, PharmD  senna (SENOKOT) 8.6 MG tablet Take 1 tablet by mouth daily.    [provider]     Critical care time: 52 minutes    Patient seen and examined chart reviewed critical care plan discussed with APP [place].  Agree with above assessment and plan.   Hayden Pedro, AGACNP-BC [facility]   PCCM Pgr: [phone]

## 2019-08-23 NOTE — Progress Notes (Signed)
Called to bedside by rapid response.  Report given that patient on 4L [place] in dialysis on arrival however had some lowering of saturation levels and placed on non-rebreather saturating at 100% for most of his session around 2 1/2 hours.  Patient reportedly became more and more somnolent during session and eventually responded only to sternal rub.  On my arrival His bp was lower but adequate at 90/60 warm extremities, normal heart rate.  CBG 123.  RR around 10, shallow breaths.  Suspecting CO2 narcosis rapid response had already ordered an ABG which I agreed with, we placed the patient on bipap and consulted critical care for assessment and likely intubation.  ABG returned with severe CO2 retention at PCO2 of 113, pH of 7.01 and PO2 around 60.  Patient transported to ICU and intubated for hypercarbic respiratory failure. Marland Kitchen

## 2019-08-24 ENCOUNTER — Inpatient Hospital Stay (HOSPITAL_COMMUNITY): Payer: Medicare Other

## 2019-08-24 DIAGNOSIS — Z9911 Dependence on respirator [ventilator] status: Secondary | ICD-10-CM

## 2019-08-24 DIAGNOSIS — N179 Acute kidney failure, unspecified: Secondary | ICD-10-CM

## 2019-08-24 DIAGNOSIS — J810 Acute pulmonary edema: Secondary | ICD-10-CM

## 2019-08-24 DIAGNOSIS — N186 End stage renal disease: Secondary | ICD-10-CM

## 2019-08-24 DIAGNOSIS — J9601 Acute respiratory failure with hypoxia: Secondary | ICD-10-CM

## 2019-08-24 DIAGNOSIS — E8770 Fluid overload, unspecified: Secondary | ICD-10-CM

## 2019-08-24 DIAGNOSIS — Z992 Dependence on renal dialysis: Secondary | ICD-10-CM

## 2019-08-24 LAB — CULTURE, BLOOD (ROUTINE X 2)
Culture: NO GROWTH
Culture: NO GROWTH
Special Requests: ADEQUATE

## 2019-08-24 LAB — CBC
HCT: 36.3 % — ABNORMAL LOW (ref 39.0–52.0)
Hemoglobin: 11.1 g/dL — ABNORMAL LOW (ref 13.0–17.0)
MCH: 30 pg (ref 26.0–34.0)
MCHC: 30.6 g/dL (ref 30.0–36.0)
MCV: 98.1 fL (ref 80.0–100.0)
Platelets: 215 10*3/uL (ref 150–400)
RBC: 3.7 MIL/uL — ABNORMAL LOW (ref 4.22–5.81)
RDW: 17.5 % — ABNORMAL HIGH (ref 11.5–15.5)
WBC: 13.7 10*3/uL — ABNORMAL HIGH (ref 4.0–10.5)
nRBC: 0.1 % (ref 0.0–0.2)

## 2019-08-24 LAB — BASIC METABOLIC PANEL
Anion gap: 16 — ABNORMAL HIGH (ref 5–15)
BUN: 51 mg/dL — ABNORMAL HIGH (ref 6–20)
CO2: 23 mmol/L (ref 22–32)
Calcium: 7.7 mg/dL — ABNORMAL LOW (ref 8.9–10.3)
Chloride: 95 mmol/L — ABNORMAL LOW (ref 98–111)
Creatinine, Ser: 11.26 mg/dL — ABNORMAL HIGH (ref 0.61–1.24)
GFR calc Af Amer: 5 mL/min — ABNORMAL LOW (ref >60)
GFR calc non Af Amer: 5 mL/min — ABNORMAL LOW (ref >60)
Glucose, Bld: 92 mg/dL (ref 70–99)
Potassium: 4.4 mmol/L (ref 3.5–5.1)
Sodium: 134 mmol/L — ABNORMAL LOW (ref 135–145)

## 2019-08-24 LAB — GLUCOSE, CAPILLARY
Glucose-Capillary: 89 mg/dL (ref 70–99)
Glucose-Capillary: 90 mg/dL (ref 70–99)
Glucose-Capillary: 100 mg/dL — ABNORMAL HIGH (ref 70–99)
Glucose-Capillary: 183 mg/dL — ABNORMAL HIGH (ref 70–99)
Glucose-Capillary: 162 mg/dL — ABNORMAL HIGH (ref 70–99)
Glucose-Capillary: 129 mg/dL — ABNORMAL HIGH (ref 70–99)

## 2019-08-24 LAB — MAGNESIUM: Magnesium: 1.9 mg/dL (ref 1.7–2.4)

## 2019-08-24 MED ORDER — PANTOPRAZOLE SODIUM 40 MG PO TBEC
40.0000 mg | DELAYED_RELEASE_TABLET | Freq: Every day | ORAL | Status: DC
  Administered 2019-08-24 – 2019-08-26 (×3): 40 mg via ORAL
  Filled 2019-08-24 (×3): qty 1

## 2019-08-24 MED ORDER — ORAL CARE MOUTH RINSE
15.0000 mL | Freq: Two times a day (BID) | OROMUCOSAL | Status: DC
  Administered 2019-08-25 – 2019-08-26 (×2): 15 mL via OROMUCOSAL

## 2019-08-24 NOTE — Evaluation (Signed)
Clinical/Bedside Swallow Evaluation Patient Details  Name: Seth [name] MRN: [id number] Date of Birth: December 25, 1965  Today's Date: 08/24/2019 Time: SLP Start Time (ACUTE ONLY): 1457 SLP Stop Time (ACUTE ONLY): 1505 SLP Time Calculation (min) (ACUTE ONLY): 8 min  Past Medical History:  Past Medical History:  Diagnosis Date  . Acute on chronic respiratory failure with hypoxia and hypercapnia ([place]) 05/29/2019  . Diabetes mellitus    INSULIN DEPENDENT DIABETES  . Dialysis patient ([place])   . 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 ESRD (end stage renal disease) on dialysis [facility])    [facility] M/W/F  . Hypertension   . 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 Sleep apnea    USES CPAP  . Type 2 diabetes mellitus ([place])    Past Surgical History:  Past Surgical History:  Procedure Laterality Date  . A/V FISTULAGRAM N/A 10/18/2018   Procedure: A/V FISTULAGRAM;  Surgeon: Marty Heck, MD;  Location: [place] [facility];  Service: Cardiovascular;  Laterality: N/A;  . A/V FISTULAGRAM Left 03/28/2019   Procedure: A/V Fistulagram;  Surgeon: Marty Heck, MD;  Location: [place] [facility];  Service: Cardiovascular;  Laterality: Left;  . AV FISTULA PLACEMENT  05/03/2012   Procedure: ARTERIOVENOUS (AV) FISTULA CREATION;  Surgeon: Mal [name], MD;  Location: [place];  Service: Vascular;  Laterality: Right;  . AV FISTULA PLACEMENT Left 10/23/2018   Procedure: ARTERIOVENOUS (AV) FISTULA CREATION;  Surgeon: Waynetta Sandy, MD;  Location: [place];  Service: Vascular;  Laterality: Left;  . BASCILIC VEIN TRANSPOSITION Left 04/02/2019   Procedure: BASILIC VEIN TRANSPOSITION SECOND STAGE LEFT;  Surgeon: Waynetta Sandy, MD;  Location: [place];  Service: Vascular;  Laterality: Left;  . INSERTION OF DIALYSIS CATHETER Left 12/03/2018   Procedure: INSERTION OF DIALYSIS CATHETER;  Surgeon: Marty Heck, MD;  Location: [place];   Service: Vascular;  Laterality: Left;  . INSERTION OF DIALYSIS CATHETER Left 06/03/2019   Procedure: INSERTION OF DIALYSIS CATHETER;  Surgeon: Marty Heck, MD;  Location: [place];  Service: Vascular;  Laterality: Left;  . ORIF MANDIBULAR FRACTURE N/A 11/10/2017   Procedure: OPEN REDUCTION INTERNAL FIXATION (ORIF) MANDIBULAR FRACTURE POSSIBLE MAXILLARY MANDIBULAR FIXATION;  Surgeon: Jodi Marble, MD;  Location: [place];  Service: ENT;  Laterality: N/A;  . REVISON OF ARTERIOVENOUS FISTULA Right 05/31/2018   Procedure: REVISION OF ARTERIOVENOUS FISTULA  RIGHT ARM WITH INTERPOSITION ARTEGRAFT 6MM X 16CM GRAFT;  Surgeon: Waynetta Sandy, MD;  Location: [place];  Service: Vascular;  Laterality: Right;  . TEE WITHOUT CARDIOVERSION N/A 07/10/2015   Procedure: TRANSESOPHAGEAL ECHOCARDIOGRAM (TEE);  Surgeon: Josue Hector, MD;  Location: [place];  Service: Cardiovascular;  Laterality: N/A;  . TEE WITHOUT CARDIOVERSION N/A 12/07/2018   Procedure: TRANSESOPHAGEAL ECHOCARDIOGRAM (TEE);  Surgeon: Acie Fredrickson Wonda Cheng, MD;  Location: [place];  Service: Cardiovascular;  Laterality: N/A;  . TEE WITHOUT CARDIOVERSION N/A 06/03/2019   Procedure: Transesophageal Echocardiogram (Tee);  Surgeon: Acie Fredrickson Wonda Cheng, MD;  Location: [place];  Service: Cardiovascular;  Laterality: N/A;  . tracheostomy removal    . TRACHEOSTOMY TUBE PLACEMENT N/A 08/20/2013   Procedure: TRACHEOSTOMY Revision;  Surgeon: Melida Quitter, MD;  Location: [place];  Service: ENT;  Laterality: N/A;  . UPPER EXTREMITY VENOGRAPHY Bilateral 03/28/2019   Procedure: UPPER EXTREMITY VENOGRAPHY;  Surgeon: Marty Heck, MD;  Location: [place] [facility];  Service: Cardiovascular;  Laterality: Bilateral;   HPI:  Pt is a 53 year old male presents to ED on 11/12 after falling over his children's toys 3 days ago. Reported that due to pain he missed his dialysis session 11/11. Recently placed on supplemental oxygen. In ED K 6.4, Crt 15.21. Gap  18. HD catheter noted to have small amount of purulent drainage from catheter site, went to IR for possible exchange however catheter appeared clean, non-painful, not redness. Nephrology consulted. Plans for emergent dialysis. Taken to Dialysis, 2.5 hours into session was noted to have snoring respirations, minimally responsive to sternal rub. ABG 7.013/112/46.6. Critical Care consulted. Pt was intubated 11/13-11/14.  CXR 11/14 revealed "Mild increase in bilateral interstitial and airspace opacities"   Assessment / Plan / Recommendation Clinical Impression  Pt presents with functional swallowing as assessed clinically.  Pt tolerated all consistencies trialed with no clinical s/s of aspiration.  There was mild diffuse oral residue with regular solid, which was reduced with thin liquid wash.  Recommend regular texture diet with thin liquids. SLP to follow for diet tolerance. SLP Visit Diagnosis: Dysphagia, unspecified (R13.10)    Aspiration Risk  No limitations    Diet Recommendation Regular;Thin liquid   Liquid Administration via: Cup;Straw Medication Administration: Whole meds with liquid Supervision: Patient able to self feed Compensations: Slow rate;Small sips/bites Postural Changes: Seated upright at 90 degrees    Other  Recommendations Oral Care Recommendations: Oral care BID   Follow up Recommendations None      Frequency and Duration min 2x/week  2 weeks       Prognosis Prognosis for Safe Diet Advancement: Good      Swallow Study   General HPI: Pt is a 53 year old male presents to ED on 11/12 after falling over his children's toys 3 days ago. Reported that due to pain he missed his dialysis session 11/11. Recently placed on supplemental oxygen. In ED K 6.4, Crt 15.21. Gap 18. HD catheter noted to have small amount of purulent drainage from catheter site, went to IR for possible exchange however catheter appeared clean, non-painful, not redness. Nephrology consulted. Plans for  emergent dialysis. Taken to Dialysis, 2.5 hours into session was noted to have snoring respirations, minimally responsive to sternal rub. ABG 7.013/112/46.6. Critical Care consulted. Pt was intubated 11/13-11/14.  CXR 11/14 revealed "Mild increase in bilateral interstitial and airspace opacities" Type of Study: Bedside Swallow Evaluation Previous Swallow Assessment: none Diet Prior to this Study: NPO Temperature Spikes Noted: No Respiratory Status: Nasal cannula History of Recent Intubation: Yes Length of Intubations (days): 1 days Date extubated: 08/24/19 Behavior/Cognition: Alert;Cooperative;Pleasant mood Oral Cavity Assessment: Within Functional Limits Oral Cavity - Dentition: Adequate natural dentition;Missing dentition Vision: Functional for self-feeding Self-Feeding Abilities: Able to feed self Patient Positioning: Upright in chair Baseline Vocal Quality: Normal Volitional Cough: Strong Volitional Swallow: Able to elicit    Oral/Motor/Sensory Function Overall Oral Motor/Sensory Function: Within functional limits Facial ROM: Within Functional Limits Facial Symmetry: Within Functional Limits Lingual ROM: Within Functional Limits Lingual Symmetry: Within Functional Limits Velum: Within Functional Limits Mandible: Within Functional Limits   Ice Chips Ice chips: Not tested   Thin Liquid Thin Liquid: Within functional limits Presentation: Cup;Straw    Nectar Thick Nectar Thick Liquid: Not tested   Honey Thick Honey Thick Liquid: Not tested   Puree Puree: Within functional limits Presentation: Spoon   Solid     Solid: Impaired Presentation: Self Fed Oral Phase Functional Implications: Oral residue      Lazette Estala E Rhoda Waldvogel , MA, CCC-SLP Acute Rehabilitation  Services Office: [phone]; Pager (11/14): [phone] 08/24/2019,3:18 PM
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
## 2019-08-24 NOTE — Progress Notes (Signed)
NAME:  Seth [name], MRN:  [id number], DOB:  May 01, 1966, LOS: 1 ADMISSION DATE:  08/22/2019, CONSULTATION DATE:  08/23/2019 REFERRING MD:  Dr. Heber Moro, CHIEF COMPLAINT:  Hypercarbia Respiratory Failure    History of present illness   53 year old male presents to ED on 11/12 after falling over his children's toys 3 days ago. Reported that due to pain he missed his dialysis session 11/11. Recently placed on supplemental oxygen. In ED K 6.4, Crt 15.21. Gap 18. HD catheter noted to have small amount of purulent drainage from catheter site, went to IR for possible exchange however catheter appeared clean, non-painful, not redness. Nephrology consulted. Plans for emergent dialysis. Taken to Dialysis, 2.5 hours into session was noted to have snoring respirations, minimally responsive to sternal rub. ABG 7.013/112/46.6. Critical Care consulted.   Past Medical History  DM, ESRD (MWF), Lumbar Discitis, OHS (on CPAP at HS)  Significant Hospital Events   11/12 > Presents to ED  11/13 > Transferred to ICU  Consults:  Nephrology  PCCM  Procedures:  ETT 11/13 >>   Significant Diagnostic Tests:  CXR 11/12 > . No acute findings. No convincing pulmonary edema and no evidence of pneumonia. Stable mild cardiomegaly.  Micro Data:  Blood 11/9 > Negative  Blood 11/13 >> Sputum 11/13 >>   Antimicrobials:  N/A   Interim history/subjective:  Patient doing well well overnight, remains intubated on mechanical life support in the intensive care unit.  Tolerating SBT SAT this morning.  Objective   Blood pressure 138/65, pulse 76, temperature 99.2 F (37.3 C), temperature source Axillary, resp. rate 20, height 5\' 7"  (1.702 m), weight (!) 137.3 kg, SpO2 100 %.    Vent Mode: PRVC FiO2 (%):  [40 %-60 %] 40 % Set Rate:  [20 bmp] 20 bmp Vt Set:  [530 mL] 530 mL PEEP:  [8 cmH20] 8 cmH20 Plateau Pressure:  [21 cmH20-28 cmH20] 22 cmH20   Intake/Output Summary (Last 24 hours) at 08/24/2019 0811 Last data  filed at 08/24/2019 0500 Gross per 24 hour  Intake 652.6 ml  Output 3162 ml  Net -2509.4 ml   Filed Weights   08/23/19 1330 08/23/19 1806 08/24/19 0500  Weight: (!) 140.4 kg (!) 138.2 kg (!) 137.3 kg    Examination: General: Obese African-American male, intubated on mechanical life support HENT: ET tube in place, pupils reactive, tracking appropriately Lungs: Bilateral ventilated breath sounds, no crackles Cardiovascular: Regular rate and rhythm, S1-S2 no MRG Abdomen: Decreased bowel sounds, obese pannus Extremities: No significant lower extremity edema Neuro: Awake alert follows commands no focal deficit moves all 4 extremities  Chest x-ray 08/24/2019: Reviewed Bilateral interstitial opacities, likely pulmonary edema Tunneled catheter in left. The patient's images have been independently reviewed by me.    Resolved Hospital Problem list     Assessment & Plan:   Acute on chronic hypercarbic hypoxemic respiratory failure.   Acute pulmonary edema secondary to volume overload, missed dialysis sessions History of obstructive sleep apnea on CPAP -Patient following commands this morning, following SBT SAT -Tolerating CPAP pressure support well. -We will make plans for liberation from mechanical ventilator -Hold on additional continuous sedation  End-stage renal disease on HD.  Missed session HD 11/11 Hyperkalemia and anion gap metabolic acidosis -We appreciate nephrology's assistance, continued HD per nephrology -May need vasopressors to support blood pressure during dialysis session. -This occurred yesterday.  Recent evaluation purulent material HD catheter -Decision was made to defer any exchange by IR -Repeat blood cultures pending no growth  to date.  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  Best practice:  Diet: NPO Pain/Anxiety/Delirium protocol: Held with plans for extubation VAP protocol ordered DVT prophylaxis: Heparin sq GI prophylaxis: PPI Glucose control: SSI Mobility: Bedrest Code Status: Full code Family Communication: sister here yesterday  Disposition: ICU  Labs   CBC: Recent Labs  Lab 08/18/19 2121  08/19/19 0933 08/21/19 1922 08/22/19 0922 08/23/19 0801 08/23/19 0910  WBC 12.7*  --   --  10.1 10.4 10.4  --   NEUTROABS  --   --   --   --  8.5*  --   --   HGB 11.5*   < > 13.3 10.9* 11.2* 11.3* 12.9*  HCT 39.2   < > 39.0 36.8* 37.6* 38.2* 38.0*  MCV 103.7*  --   --  101.4* 101.6* 101.9*  --   PLT 219  --   --  217 221 213  --    < > = values in this interval not displayed.    Basic Metabolic Panel: Recent Labs  Lab 08/19/19 1835 08/21/19 1922 08/22/19 0904 08/23/19 0801 08/23/19 0910 08/23/19 2015  NA 134* 131* 130* 131* 131* 129*  K 5.1 6.4* 6.5* 6.1* 5.8* 4.7  CL 94* 91* 90* 91*  --  93*  CO2 25 22 20* 22  --  20*  GLUCOSE 90 109* 143* 140*  --  91  BUN 52* 88* 97* 71*  --  41*  CREATININE 10.14* 15.21* 16.75* 14.24*  --  8.79*  CALCIUM 7.8* 6.2* 6.1* 6.5*  --  7.2*  MG  --   --   --  2.3  --   --   PHOS 6.8*  --   --  11.1*  --   --    GFR: Estimated Creatinine Clearance: 12.7 mL/min (A) (by C-G formula based on SCr of 8.79 mg/dL (H)). Recent Labs  Lab 08/18/19 2121 08/21/19 1922 08/22/19 0922 08/23/19 0738 08/23/19 0801  WBC 12.7* 10.1 10.4  --  10.4  LATICACIDVEN  --   --   --  1.2  --     Liver Function Tests: Recent Labs  Lab 08/18/19 2121 08/19/19 1835 08/23/19 0801  AST  11*  --  16  ALT 9  --  8  ALKPHOS 140*  --  140*  BILITOT 0.6  --  0.8  PROT 9.4*  --  9.2*  ALBUMIN 3.9 4.0 3.8   Recent Labs  Lab 08/18/19 2121  LIPASE 85*   No results for input(s): AMMONIA in the last 168 hours.  ABG    Component Value Date/Time   PHART 7.152 (LL) 08/23/2019 0910   PCO2ART 70.4 (HH) 08/23/2019 0910   PO2ART 80.0 (L) 08/23/2019 0910   HCO3 24.8 08/23/2019 0910   TCO2 27 08/23/2019 0910   ACIDBASEDEF 5.0 (H) 08/23/2019 0910   O2SAT 92.0 08/23/2019 0910     Coagulation Profile: No results for input(s): INR, PROTIME in the last 168 hours.  Cardiac Enzymes: No results for input(s): CKTOTAL, CKMB, CKMBINDEX,  TROPONINI in the last 168 hours.  HbA1C: Hemoglobin A1C  Date/Time Value Ref Range Status  08/15/2019 02:41 PM 7.0 (A) 4.0 - 5.6 % Final  04/29/2019 03:32 PM 9.1 (A) 4.0 - 5.6 % Final  01/16/2017 8.1  Final   Hgb A1c MFr Bld  Date/Time Value Ref Range Status  07/03/2015 02:55 PM 8.0 (H) 4.8 - 5.6 % Final    Comment:    (NOTE)         Pre-diabetes: 5.7 - 6.4         Diabetes: >6.4         Glycemic control for adults with diabetes: <7.0   04/30/2012 12:33 PM 5.8 (H) <5.7 % Final    Comment:    (NOTE)                                                                       According to the ADA Clinical Practice Recommendations for 2011, when HbA1c is used as a screening test:  >=6.5%   Diagnostic of Diabetes Mellitus           (if abnormal result is confirmed) 5.7-6.4%   Increased risk of developing Diabetes Mellitus References:Diagnosis and Classification of Diabetes Mellitus,Diabetes [id number] 1):S62-S69 and Standards of Medical Care in         Diabetes - 2011,Diabetes Care,2011,34 (Suppl 1):S11-S61.    CBG: Recent Labs  Lab 08/23/19 1604 08/23/19 1649 08/23/19 1943 08/23/19 2308 08/24/19 0359  GLUCAP 67* 95 93 94 89    This patient is critically ill with multiple organ system failure; which, requires frequent high  complexity decision making, assessment, support, evaluation, and titration of therapies. This was completed through the application of advanced monitoring technologies and extensive interpretation of multiple databases. During this encounter critical care time was devoted to patient care services described in this note for 32 minutes.  Garner Nash, DO [facility] 08/24/2019 8:11 AM

## 2019-08-24 NOTE — Progress Notes (Signed)
[facility] Progress Note  Subjective: got 2.3 L off on HD yesterday, extubated today,  pt doing very well off vent.   Vitals:   08/24/19 0900 08/24/19 1000 08/24/19 1100 08/24/19 1214  BP: (!) 110/56 (!) 101/59 128/65   Pulse: 92 94 99   Resp: (!) 21 (!) 21 20   Temp:    98.8 F (37.1 C)  TempSrc:    Oral  SpO2: 100%  100%   Weight:      Height:        Inpatient medications: . aspirin  81 mg Oral Daily  . atorvastatin  40 mg Oral Daily  . calcium carbonate  2 tablet Oral TID  . Chlorhexidine Gluconate Cloth  6 each Topical Q0600  . Chlorhexidine Gluconate Cloth  6 each Topical Q0600  . diclofenac sodium  2 g Topical QID  . doxercalciferol  1.5 mcg Intravenous Q M,W,F-HD  . ferric citrate  630 mg Oral TID WC  . heparin  5,000 Units Subcutaneous Q8H  . heparin  5,000 Units Dialysis Once in dialysis  . heparin  6,000 Units Dialysis Once in dialysis  . insulin aspart  2-6 Units Subcutaneous Q4H  . mouth rinse  15 mL Mouth Rinse BID  . mupirocin ointment  1 application Nasal BID  . pantoprazole  40 mg Oral Daily   . norepinephrine (LEVOPHED) Adult infusion Stopped (08/24/19 0817)   acetaminophen **OR** acetaminophen, senna-docusate    Exam: General:obese, not in distress, extubate Neck: JVD not elevated. Chest: CTA bilat to the bases Heart:RRR with normal S1, S2 Abdomen: Soft, non-tender, non-distended Musculoskeletal: Strength and tone appear normal for age. Lower extremities:No edema b/l lower extremities Neuro: alert, O x3 Dialysis Access:LUE AVF + bruit, TDC in place   Dialysis: MWF [facility]   4h  138.5kg  2/2.5 bath  RIJ TDC/ LUA AVF  Hep 4500+ 1000/hr Epogen 1200units last does 11/6 Hectorol 1.5 mcg IV q HD  Assessment/Plan: 1. Hyperkalemia: resolved 2. Hypercarbic resp failure/ OSA/ OSH: on O2 at home, extubated now, stable, per CCM.  3. ESRD: on HDMWF at [facility]. Reportedly has poor compliance with diet, meds and  fluid restriction. Had HD Thursday and Friday here. At dry wt and stable sats, no vol excess on exam. Next HD Monday 4. HTN/ volume: as above, at dry wt today after HD x 2 here 5. Anemia:Hemoglobin 11's. No ESA indicated.  6. Hypocalcemia:Calcium 6.1. Spoke to RN at outpatient HD unit on 11/9, last outpatient Ca was 8.1. RN reports patient is supposed to be tasking 2 Tums in between his meals. Takes auryxia 3 tabs per meal and 2 with snacks. Will restart tums, auryxia and hectorol. 7. Drainage from HD cath:  resolved and blood cultures negative to date. Will not restart antibiotics at this time.  8. Nutrition:Renal diet with fluid restrictions      Rob Zilphia Kozinski 08/24/2019, 2:25 PM  Iron/TIBC/Ferritin/ %Sat    Component Value Date/Time   IRON 193 (H) 07/04/2015 1800   TIBC 221 (L) 07/04/2015 1800   FERRITIN 129 04/30/2012 1233   IRONPCTSAT 87 (H) 07/04/2015 1800   Recent Labs  Lab 08/23/19 0801  08/24/19 0946  NA 131*   < > 134*  K 6.1*   < > 4.4  CL 91*   < > 95*  CO2 22   < > 23  GLUCOSE 140*   < > 92  BUN 71*   < > 51*  CREATININE 14.24*   < >  11.26*  CALCIUM 6.5*   < > 7.7*  PHOS 11.1*  --   --   ALBUMIN 3.8  --   --    < > = values in this interval not displayed.   Recent Labs  Lab 08/23/19 0801  AST 16  ALT 8  ALKPHOS 140*  BILITOT 0.8  PROT 9.2*   Recent Labs  Lab 08/24/19 0946  WBC 13.7*  HGB 11.1*  HCT 36.3*  PLT 215

## 2019-08-24 NOTE — Procedures (Signed)
Extubation Procedure Note  Patient Details:   Name: Seth [name] DOB: 12/25/1965 MRN: [id number]   Airway Documentation:    Vent end date: 08/24/19 Vent end time: 0925   Evaluation  O2 sats: stable throughout Complications: No apparent complications Patient did tolerate procedure well. Bilateral Breath Sounds: Diminished   Yes   Patient extubated to [place]. Vitals signs stable at this time. No complications. RT will continue to monitor.  Mcneil Sober 08/24/2019, 9:31 AM

## 2019-08-25 DIAGNOSIS — G4733 Obstructive sleep apnea (adult) (pediatric): Secondary | ICD-10-CM

## 2019-08-25 DIAGNOSIS — Z9989 Dependence on other enabling machines and devices: Secondary | ICD-10-CM

## 2019-08-25 LAB — BASIC METABOLIC PANEL
Anion gap: 20 — ABNORMAL HIGH (ref 5–15)
BUN: 71 mg/dL — ABNORMAL HIGH (ref 6–20)
CO2: 21 mmol/L — ABNORMAL LOW (ref 22–32)
Calcium: 8.1 mg/dL — ABNORMAL LOW (ref 8.9–10.3)
Chloride: 94 mmol/L — ABNORMAL LOW (ref 98–111)
Creatinine, Ser: 12.73 mg/dL — ABNORMAL HIGH (ref 0.61–1.24)
GFR calc Af Amer: 5 mL/min — ABNORMAL LOW (ref >60)
GFR calc non Af Amer: 4 mL/min — ABNORMAL LOW (ref >60)
Glucose, Bld: 107 mg/dL — ABNORMAL HIGH (ref 70–99)
Potassium: 5 mmol/L (ref 3.5–5.1)
Sodium: 135 mmol/L (ref 135–145)

## 2019-08-25 LAB — CBC
HCT: 35.5 % — ABNORMAL LOW (ref 39.0–52.0)
Hemoglobin: 11.1 g/dL — ABNORMAL LOW (ref 13.0–17.0)
MCH: 30.2 pg (ref 26.0–34.0)
MCHC: 31.3 g/dL (ref 30.0–36.0)
MCV: 96.7 fL (ref 80.0–100.0)
Platelets: 231 10*3/uL (ref 150–400)
RBC: 3.67 MIL/uL — ABNORMAL LOW (ref 4.22–5.81)
RDW: 17.3 % — ABNORMAL HIGH (ref 11.5–15.5)
WBC: 12.7 10*3/uL — ABNORMAL HIGH (ref 4.0–10.5)
nRBC: 0.2 % (ref 0.0–0.2)

## 2019-08-25 LAB — GLUCOSE, CAPILLARY
Glucose-Capillary: 93 mg/dL (ref 70–99)
Glucose-Capillary: 112 mg/dL — ABNORMAL HIGH (ref 70–99)
Glucose-Capillary: 121 mg/dL — ABNORMAL HIGH (ref 70–99)
Glucose-Capillary: 154 mg/dL — ABNORMAL HIGH (ref 70–99)
Glucose-Capillary: 126 mg/dL — ABNORMAL HIGH (ref 70–99)

## 2019-08-25 MED ORDER — AMOXICILLIN-POT CLAVULANATE 500-125 MG PO TABS
1.0000 | ORAL_TABLET | Freq: Two times a day (BID) | ORAL | Status: DC
  Administered 2019-08-25 – 2019-08-26 (×3): 500 mg via ORAL
  Filled 2019-08-25 (×4): qty 1

## 2019-08-25 MED ORDER — CHLORHEXIDINE GLUCONATE CLOTH 2 % EX PADS
6.0000 | MEDICATED_PAD | Freq: Every day | CUTANEOUS | Status: DC
  Administered 2019-08-25 – 2019-08-26 (×2): 6 via TOPICAL

## 2019-08-25 MED ORDER — AMOXICILLIN-POT CLAVULANATE 875-125 MG PO TABS: 1.0000 | ORAL_TABLET | Freq: Two times a day (BID) | ORAL | Status: DC

## 2019-08-25 NOTE — Progress Notes (Signed)
[facility] Progress Note  Subjective: stable on 2L [place], BP's labile but > 90's. Up in chair, no c/o.    Vitals:   08/25/19 0400 08/25/19 0500 08/25/19 0600 08/25/19 0735  BP: 106/67 (!) 114/53 (!) 138/95   Pulse: 83 89 85   Resp: 20 19 (!) 23   Temp:    98.2 F (36.8 C)  TempSrc:    Oral  SpO2: 94% 91% 95%   Weight:  135.3 kg    Height:        Inpatient medications: . aspirin  81 mg Oral Daily  . atorvastatin  40 mg Oral Daily  . calcium carbonate  2 tablet Oral TID  . Chlorhexidine Gluconate Cloth  6 each Topical Q0600  . Chlorhexidine Gluconate Cloth  6 each Topical Q0600  . diclofenac sodium  2 g Topical QID  . doxercalciferol  1.5 mcg Intravenous Q M,W,F-HD  . ferric citrate  630 mg Oral TID WC  . heparin  5,000 Units Subcutaneous Q8H  . heparin  5,000 Units Dialysis Once in dialysis  . heparin  6,000 Units Dialysis Once in dialysis  . insulin aspart  2-6 Units Subcutaneous Q4H  . mouth rinse  15 mL Mouth Rinse BID  . mupirocin ointment  1 application Nasal BID  . pantoprazole  40 mg Oral Daily   . norepinephrine (LEVOPHED) Adult infusion Stopped (08/24/19 0817)   acetaminophen **OR** acetaminophen, senna-docusate    Exam: General:obese, not in distress, calm and pleasant, 3L [place] Neck: JVD not elevated. Chest: occ rhonchi, no rales Heart:RRR with normal S1, S2 Abdomen: Soft, non-tender, non-distended Musculoskeletal: Strength and tone appear normal for age. Lower extremities:No edema b/l lower extremities Neuro: alert, O x3 Access:LUE AVF + bruit, TDC in place   Dialysis: MWF [facility]   4h  138.5kg  2/2.5 bath  RIJ TDC/ LUA AVF  Hep 4500+ 1000/hr Epogen 1200units last does 11/6 Hectorol 1.5 mcg IV q HD  Assessment/Plan: 1. Hyperkalemia: resolved w HD 2. Hypercarbic resp failure/ OSA/ OSH: on O2 at home, intubated for about 24 hrs for pCO2 > 100 and confusion. Extubated now, AMS resolved. Moving out of ICU today.  3. ESRD: on  HDMWF at [facility]. At dry wt and stable sats, no vol excess on exam. HD due tomorrow, if stable for dc would prefer go to OP HD, we will take care of transport, etc., d/w patient who understands.  4. HTN/ volume: did not have vol overload as cause or resp failure, but rather hypercarbia. Under dry wt.  5. Anemia [id number] 11's. No ESA indicated.  6. Hypocalcemia:Calcium 6.1. Spoke to RN at outpatient HD unit on 11/9, last outpatient Ca was 8.1. RN reports patient is supposed to be tasking 2 Tums in between his meals. Takes auryxia 3 tabs per meal and 2 with snacks. Will restart tums, auryxia and hectorol. 7. Nutrition:Renal diet with fluid restrictions   Rob Venna Berberich 08/25/2019, 8:14 AM  Iron/TIBC/Ferritin/ %Sat    Component Value Date/Time   IRON 193 (H) 07/04/2015 1800   TIBC 221 (L) 07/04/2015 1800   FERRITIN 129 04/30/2012 1233   IRONPCTSAT 87 (H) 07/04/2015 1800   Recent Labs  Lab 08/23/19 0801  08/24/19 0946  NA 131*   < > 134*  K 6.1*   < > 4.4  CL 91*   < > 95*  CO2 22   < > 23  GLUCOSE 140*   < > 92  BUN 71*   < >  51*  CREATININE 14.24*   < > 11.26*  CALCIUM 6.5*   < > 7.7*  PHOS 11.1*  --   --   ALBUMIN 3.8  --   --    < > = values in this interval not displayed.   Recent Labs  Lab 08/23/19 0801  AST 16  ALT 8  ALKPHOS 140*  BILITOT 0.8  PROT 9.2*   Recent Labs  Lab 08/24/19 0946  WBC 13.7*  HGB 11.1*  HCT 36.3*  PLT 215

## 2019-08-25 NOTE — Progress Notes (Signed)
PHARMACY NOTE:  ANTIMICROBIAL RENAL DOSAGE ADJUSTMENT  Current antimicrobial regimen includes a mismatch between antimicrobial dosage and estimated renal function.  As per policy approved by the Pharmacy & Therapeutics and Medical Executive Committees, the antimicrobial dosage will be adjusted accordingly.  Current antimicrobial dosage:  Augmentin 875/125 mg BID  Indication: Bronchitis  Renal Function:  Estimated Creatinine Clearance: 8.7 mL/min (A) (by C-G formula based on SCr of 12.73 mg/dL (H)). [x]      On intermittent HD, scheduled: MWF []      On CRRT    Antimicrobial dosage has been changed to:  Augmentin 500/125 mg BID  Thank you for allowing pharmacy to be a part of this patient's care.  Vertis Kelch, PharmD PGY2 Cardiology Pharmacy Resident Phone [phone] 08/25/2019       8:35 AM  Please check [email or web address] for unit-specific pharmacist phone numbers

## 2019-08-25 NOTE — Progress Notes (Addendum)
NAME:  Seth [name], MRN:  [id number], DOB:  Jan 05, 1966, LOS: 2 ADMISSION DATE:  08/22/2019, CONSULTATION DATE:  08/23/2019 REFERRING MD:  Dr. [place], CHIEF COMPLAINT:  Hypercarbia Respiratory Failure    History of present illness   53 year old male presents to ED on 11/12 after falling over his children's toys 3 days ago. Reported that due to pain he missed his dialysis session 11/11. Recently placed on supplemental oxygen. In ED K 6.4, Crt 15.21. Gap 18. HD catheter noted to have small amount of purulent drainage from catheter site, went to IR for possible exchange however catheter appeared clean, non-painful, not redness. Nephrology consulted. Plans for emergent dialysis. Taken to Dialysis, 2.5 hours into session was noted to have snoring respirations, minimally responsive to sternal rub. ABG 7.013/112/46.6. Critical Care consulted.   Past Medical History  DM, ESRD (MWF), Lumbar Discitis, OHS (on CPAP at HS)  Significant Hospital Events   11/12 > Presents to ED  11/13 > Transferred to ICU  Consults:  Nephrology  PCCM  Procedures:  ETT 11/13 >>   Significant Diagnostic Tests:  CXR 11/12 > . No acute findings. No convincing pulmonary edema and no evidence of pneumonia. Stable mild cardiomegaly.  Micro Data:  Blood 11/9 > Negative  Blood 11/13 >> Sputum 11/13 >>   Antimicrobials:  N/A   Interim history/subjective:   Doing well this morning sitting up in chair.  Resting comfortable no acute distress.  Objective   Blood pressure (!) 138/95, pulse 85, temperature 98.2 F (36.8 C), temperature source Oral, resp. rate (!) 23, height 5\' 7"  (1.702 m), weight 135.3 kg, SpO2 95 %.        Intake/Output Summary (Last 24 hours) at 08/25/2019 0817 Last data filed at 08/24/2019 1800 Gross per 24 hour  Intake 485.18 ml  Output -  Net 485.18 ml   Filed Weights   08/23/19 1806 08/24/19 0500 08/25/19 0500  Weight: (!) 138.2 kg (!) 137.3 kg 135.3 kg    Examination: General:  Obese African-American male, resting in chair alert HENT: Large neck, pupils reactive tracking appropriately with no obvious JVD Lungs: Bilateral breath sounds, diminished in the bases difficult to auscultate no wheeze Cardiovascular: Regular rate and rhythm, S1-S2 no MRG Abdomen: Decreased breath sounds Extremities: Large extremities no significant edema Neuro: Awake alert following commands no focal deficit  Chest x-ray 08/24/2019: Reviewed Bilateral interstitial opacities, likely pulmonary edema Tunneled catheter in left.  Resolved Hospital Problem list     Assessment & Plan:   Acute on chronic hypercarbic hypoxemic respiratory failure, resolved Acute pulmonary edema secondary to volume overload, missed dialysis sessions, resolved History of obstructive sleep apnea on CPAP -Continue CPAP nightly -Patient must continue to go to dialysis maintain euvolemic or this will be a recurrent issue  End-stage renal disease on HD.  Missed session HD 11/11 Hyperkalemia and anion gap metabolic acidosis, resolved -Appreciate nephrology's assistance -Plan for dialysis session again Monday  Trach aspirate + H flu, Bronchitis, vs PNA however Cxr looks like pulmonary edema   - treat 5 days Augmentin   Recent evaluation purulent material HD catheter -Repeat cultures no growth to date no plans to exchange catheter  DM type II -SSI plus CBG  Hypertension, currently normotensive Still holding home blood pressure medications Blood pressure has remained normotensive off of these.  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  Plan -Conservative transfusion threshold  Toxic metabolic encephalopathy -Resolved secondary to hypercapnia  Nutrition Regular diet  Best practice:  Diet: Regular  diet Pain/Anxiety/Delirium protocol: Held with plans for extubation VAP protocol ordered DVT prophylaxis: Heparin sq GI prophylaxis: PPI Glucose control: SSI Mobility: Bedrest Code Status: Full code  Family Communication: Spoke with sister yesterday Disposition: Stable for transfer to telemetry  Labs   CBC: Recent Labs  Lab 08/18/19 2121  08/21/19 1922 08/22/19 0922 08/23/19 0801 08/23/19 0910 08/24/19 0946  WBC 12.7*  --  10.1 10.4 10.4  --  13.7*  NEUTROABS  --   --   --  8.5*  --   --   --   HGB 11.5*   < > 10.9* 11.2* 11.3* 12.9* 11.1*  HCT 39.2   < > 36.8* 37.6* 38.2* 38.0* 36.3*  MCV 103.7*  --  101.4* 101.6* 101.9*  --  98.1  PLT 219  --  217 221 213  --  215   < > = values in this interval not displayed.    Basic Metabolic Panel: Recent Labs  Lab 08/19/19 1835 08/21/19 1922 08/22/19 0904 08/23/19 0801 08/23/19 0910 08/23/19 2015 08/24/19 0946  NA 134* 131* 130* 131* 131* 129* 134*  K 5.1 6.4* 6.5* 6.1* 5.8* 4.7 4.4  CL 94* 91* 90* 91*  --  93* 95*  CO2 25 22 20* 22  --  20* 23  GLUCOSE 90 109* 143* 140*  --  91 92  BUN 52* 88* 97* 71*  --  41* 51*  CREATININE 10.14* 15.21* 16.75* 14.24*  --  8.79* 11.26*  CALCIUM 7.8* 6.2* 6.1* 6.5*  --  7.2* 7.7*  MG  --   --   --  2.3  --   --  1.9  PHOS 6.8*  --   --  11.1*  --   --   --    GFR: Estimated Creatinine Clearance: 9.8 mL/min (A) (by C-G formula based on SCr of 11.26 mg/dL (H)). Recent Labs  Lab 08/21/19 1922 08/22/19 0922 08/23/19 0738 08/23/19 0801 08/24/19 0946  WBC 10.1 10.4  --  10.4 13.7*  LATICACIDVEN  --   --  1.2  --   --     Liver Function Tests: Recent Labs  Lab 08/18/19 2121 08/19/19 1835 08/23/19 0801  AST 11*  --  16  ALT 9  --  8  ALKPHOS 140*  --  140*  BILITOT 0.6  --  0.8  PROT 9.4*  --  9.2*  ALBUMIN 3.9 4.0 3.8   Recent Labs  Lab 08/18/19 2121  LIPASE 85*   No results for input(s): AMMONIA in the last 168 hours.  ABG    Component Value Date/Time   PHART 7.152 (LL) 08/23/2019 0910   PCO2ART 70.4 (HH) 08/23/2019 0910   PO2ART 80.0 (L) 08/23/2019 0910   HCO3 24.8 08/23/2019 0910   TCO2 27 08/23/2019 0910   ACIDBASEDEF 5.0 (H) 08/23/2019 0910   O2SAT 92.0  08/23/2019 0910     Coagulation Profile: No results for input(s): INR, PROTIME in the last 168 hours.  Cardiac Enzymes: No results for input(s): CKTOTAL, CKMB, CKMBINDEX, TROPONINI in the last 168 hours.  HbA1C: Hemoglobin A1C  Date/Time Value Ref Range Status  08/15/2019 02:41 PM 7.0 (A) 4.0 - 5.6 % Final  04/29/2019 03:32 PM 9.1 (A) 4.0 - 5.6 % Final  01/16/2017 8.1  Final   Hgb A1c MFr Bld  Date/Time Value Ref Range Status  07/03/2015 02:55 PM 8.0 (H) 4.8 - 5.6 % Final    Comment:    (NOTE)  Pre-diabetes: 5.7 - 6.4         Diabetes: >6.4         Glycemic control for adults with diabetes: <7.0   04/30/2012 12:33 PM 5.8 (H) <5.7 % Final    Comment:    (NOTE)                                                                       According to the ADA Clinical Practice Recommendations for 2011, when HbA1c is used as a screening test:  >=6.5%   Diagnostic of Diabetes Mellitus           (if abnormal result is confirmed) 5.7-6.4%   Increased risk of developing Diabetes Mellitus References:Diagnosis and Classification of Diabetes Mellitus,Diabetes [id number] 1):S62-S69 and Standards of Medical Care in         Diabetes - 2011,Diabetes [id number] (Suppl 1):S11-S61.    CBG: Recent Labs  Lab 08/24/19 1648 08/24/19 2005 08/24/19 2324 08/25/19 0354 08/25/19 0740  GLUCAP 183* 162* 129* 93 112*    Patient stable for transfer from the medical intensive care unit to telemetry.  I will reach out to internal medicine service.   Garner Nash, DO [place] [facility] 08/25/2019 8:17 AM

## 2019-08-25 NOTE — Plan of Care (Signed)

## 2019-08-25 NOTE — Plan of Care (Deleted)
TRH pickup from PCCM on 11/16.  See TRH communication for further details.

## 2019-08-25 NOTE — Care Plan (Signed)
PCCM:  Spoke with Dr. Koleen Distance. Internal medicine service will pick up care in the morning.   Garner Nash, DO [place] [facility] 08/25/2019 9:07 AM

## 2019-08-25 NOTE — Progress Notes (Signed)
[place] Progress Note Patient Name: Seth [name] DOB: 03/26/1966 MRN: [id number]   Date of Service  08/25/2019  HPI/Events of Note  AM labs requested   eICU Interventions  Order placed     Intervention Category Minor Interventions: Clinical assessment - ordering diagnostic tests  Margaretmary Lombard 08/25/2019, 6:35 AM

## 2019-08-26 ENCOUNTER — Telehealth: Payer: Self-pay | Admitting: Internal Medicine

## 2019-08-26 DIAGNOSIS — F4310 Post-traumatic stress disorder, unspecified: Secondary | ICD-10-CM

## 2019-08-26 DIAGNOSIS — Z6841 Body Mass Index (BMI) 40.0 and over, adult: Secondary | ICD-10-CM

## 2019-08-26 DIAGNOSIS — Z9115 Patient's noncompliance with renal dialysis: Secondary | ICD-10-CM

## 2019-08-26 DIAGNOSIS — G9341 Metabolic encephalopathy: Secondary | ICD-10-CM

## 2019-08-26 DIAGNOSIS — Z9111 Patient's noncompliance with dietary regimen: Secondary | ICD-10-CM

## 2019-08-26 DIAGNOSIS — E662 Morbid (severe) obesity with alveolar hypoventilation: Secondary | ICD-10-CM

## 2019-08-26 LAB — CULTURE, RESPIRATORY W GRAM STAIN

## 2019-08-26 LAB — GLUCOSE, CAPILLARY
Glucose-Capillary: 118 mg/dL — ABNORMAL HIGH (ref 70–99)
Glucose-Capillary: 159 mg/dL — ABNORMAL HIGH (ref 70–99)
Glucose-Capillary: 173 mg/dL — ABNORMAL HIGH (ref 70–99)
Glucose-Capillary: 91 mg/dL (ref 70–99)

## 2019-08-26 LAB — BASIC METABOLIC PANEL
Anion gap: 26 — ABNORMAL HIGH (ref 5–15)
BUN: 95 mg/dL — ABNORMAL HIGH (ref 6–20)
CO2: 9 mmol/L — ABNORMAL LOW (ref 22–32)
Calcium: 7.3 mg/dL — ABNORMAL LOW (ref 8.9–10.3)
Chloride: 85 mmol/L — ABNORMAL LOW (ref 98–111)
Creatinine, Ser: 14.63 mg/dL — ABNORMAL HIGH (ref 0.61–1.24)
GFR calc Af Amer: 4 mL/min — ABNORMAL LOW (ref >60)
GFR calc non Af Amer: 3 mL/min — ABNORMAL LOW (ref >60)
Glucose, Bld: 100 mg/dL — ABNORMAL HIGH (ref 70–99)
Potassium: 4.8 mmol/L (ref 3.5–5.1)
Sodium: 120 mmol/L — ABNORMAL LOW (ref 135–145)

## 2019-08-26 MED ORDER — LORATADINE 10 MG PO TABS
10.0000 mg | ORAL_TABLET | Freq: Every day | ORAL | Status: DC | PRN
  Administered 2019-08-26: 10 mg via ORAL
  Filled 2019-08-26: qty 1

## 2019-08-26 MED ORDER — AMOXICILLIN-POT CLAVULANATE 500-125 MG PO TABS: 1.0000 | ORAL_TABLET | Freq: Two times a day (BID) | ORAL | 0 refills | Status: AC

## 2019-08-26 MED ORDER — GUAIFENESIN-DM 100-10 MG/5ML PO SYRP
5.0000 mL | ORAL_SOLUTION | ORAL | Status: DC | PRN
  Administered 2019-08-26: 01:00:00 5 mL via ORAL
  Filled 2019-08-26: qty 5

## 2019-08-26 MED ORDER — SERTRALINE HCL 50 MG PO TABS: 50.0000 mg | ORAL_TABLET | Freq: Every day | ORAL | 0 refills | Status: DC

## 2019-08-26 MED ORDER — SERTRALINE HCL 50 MG PO TABS
50.0000 mg | ORAL_TABLET | Freq: Every day | ORAL | Status: DC
  Administered 2019-08-26: 09:00:00 50 mg via ORAL
  Filled 2019-08-26: qty 1

## 2019-08-26 NOTE — TOC Progression Note (Signed)
Transition of Care [facility]) - Progression Note    Patient Details  Name: Seth [name] MRN: [id number] Date of Birth: 03-23-1966  Transition of Care [facility]) CM/SW [place], [place] Phone Number: [phone] 08/26/2019, 2:06 PM  Clinical Narrative:     CSW received consult from Chaplain regarding patient's PTSD and seeking counseling services. CSW provided patient a list of outpatient counseling/therapy services including [facility] with address and phone number for patient to call and make appointment he chooses that, among other options of outpatient counseling services on list.   Expected Discharge Plan: [place] Barriers to Discharge: No Barriers Identified  Expected Discharge Plan and Services Expected Discharge Plan: [place] In-house Referral: NA Discharge Planning Services: CM Consult Post Acute Care Choice: Durable Medical Equipment, Home Health Living arrangements for the past 2 months: Single Family Home Expected Discharge Date: 08/26/19               DME Arranged: Gilford Rile rolling DME Agency: [employer] Date DME Agency Contacted: 08/26/19 Time DME Agency Contacted: 1032 Representative spoke with at DME Agency: Zack [place]: PT, OT [place] Agency: [place] ([place]) Date [place]: 08/26/19 Time [place]: 1032 Representative spoke with at [place]: [place] ([place]) Interventions    Readmission Risk Interventions Readmission Risk Prevention Plan 08/26/2019  Transportation Screening Complete  Medication Review Press photographer) Complete  PCP or Specialist appointment within 3-5 days of discharge Complete  HRI or [place] Complete  SW Recovery Care/Counseling Consult Complete  [place] Not Applicable  Some recent data might be hidden

## 2019-08-26 NOTE — TOC Initial Note (Signed)
Transition of Care [facility]) - Initial/Assessment Note    Patient Details  Name: Seth [name] MRN: [id number] Date of Birth: 04-01-1966  Transition of Care [facility]) CM/SW Contact:    Zenon Mayo, RN Phone Number: 08/26/2019, 10:34 AM  Clinical Narrative:                 Patient for discharge today, NCM offered choice for HHPT/HHOT, he chose [facility], referral given to [facility], awaiting to hear back.  Patient has PCP with internal medicine clinic.  He is a [place] patient but going thru Medicare for [facility] services and rolling walker.  Referral made to [facility] with  [employer] for rolling walker , he will bring to room prior to dc.   Expected Discharge Plan: [place] Barriers to Discharge: No Barriers Identified   Patient Goals and CMS Choice Patient states their goals for this hospitalization and ongoing recovery are:: go home CMS Medicare.gov Compare Post Acute Care list provided to:: Patient Choice offered to / list presented to : Patient  Expected Discharge Plan and Services Expected Discharge Plan: [place] In-house Referral: NA Discharge Planning Services: CM Consult Post Acute Care Choice: Durable Medical Equipment, Home Health Living arrangements for the past 2 months: Single Family Home Expected Discharge Date: 08/26/19               DME Arranged: Gilford Rile rolling DME Agency: [employer] Date DME Agency Contacted: 08/26/19 Time DME Agency Contacted: 5 Representative spoke with at DME Agency: [place] Arranged: PT, OT [place] Agency: [place] ([place]) Date [place]: 08/26/19 Time [place]: 99 Representative spoke with at [place]: [place] Arrangements/Services Living arrangements for the past 2 months: [place] with:: Relatives Patient language and need for interpreter reviewed:: Yes Do you feel safe going back to the place where you live?: Yes      Need for Family  Participation in Patient Care: No (Comment) Care giver support system in place?: No (comment) Current home services: DME(home oxygen) Criminal Activity/Legal Involvement Pertinent to Current Situation/Hospitalization: No - Comment as needed  Activities of Daily Living Home Assistive Devices/Equipment: Oxygen, CPAP ADL Screening (condition at time of admission) Patient's cognitive ability adequate to safely complete daily activities?: Yes Is the patient deaf or have difficulty hearing?: No Does the patient have difficulty seeing, even when wearing glasses/contacts?: No Does the patient have difficulty concentrating, remembering, or making decisions?: Yes Patient able to express need for assistance with ADLs?: No Does the patient have difficulty dressing or bathing?: Yes Independently performs ADLs?: Yes (appropriate for developmental age) Does the patient have difficulty walking or climbing stairs?: Yes Weakness of Legs: Both Weakness of Arms/Hands: Both  Permission Sought/Granted                  Emotional Assessment Appearance:: Appears stated age Attitude/Demeanor/Rapport: Engaged Affect (typically observed): Appropriate Orientation: : Oriented to Self, Oriented to Place, Oriented to  Time, Oriented to Situation Alcohol / Substance Use: Not Applicable Psych Involvement: No (comment)  Admission diagnosis:  ESRD (end stage renal disease) on dialysis ([place]) [N18.6, Z99.2] Patient Active Problem List   Diagnosis Date Noted  . Pressure injury of skin 08/23/2019  . Acute respiratory failure with hypoxia ([place]) 08/23/2019  . Acute kidney injury ([place]) 08/22/2019  . Chronic hypoxemic respiratory failure ([place])   . Hypoxia 08/16/2019  . Eczema 08/16/2019  . Chronic, continuous use of opioids 08/16/2019  .  Furuncles 04/30/2019  . NSTEMI (non-ST elevated myocardial infarction) ([place]) 11/29/2018  . Hyponatremia 11/29/2018  . Hyperkalemia 09/03/2018  . Nightmares 12/28/2017  .  Preventative health care 10/01/2015  . 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 Muscle pain, lumbar 08/11/2015  . Controlled type 2 diabetes mellitus with chronic kidney disease on chronic dialysis ([place]) 07/20/2015  . Absolute anemia   . OSA (obstructive sleep apnea) 10/14/2013  . End stage renal disease on dialysis ([place]) 04/30/2012  . Hyperlipidemia associated with type 2 diabetes mellitus ([place]) 06/20/2007  . Obesity hypoventilation syndrome ([place]) 06/20/2007  . Hypertension associated with diabetes ([place]) 06/20/2007   PCP:  Clinic, [place]:   [facility] [place], [address] AT [place] [address] Phone: [phone] Fax: [place], [place] - [address] [address]. [address] [place] [address] Phone: [phone] Fax: [phone]     Social Determinants of Health (SDOH) Interventions    Readmission Risk Interventions Readmission Risk Prevention Plan 08/26/2019  Transportation Screening Complete  Medication Review ([place]) Complete  PCP or Specialist appointment within 3-5 days of discharge Complete  HRI or [place] Complete  SW Recovery Care/Counseling Consult Complete  [place] Not Applicable  Some recent data might be hidden

## 2019-08-26 NOTE — Progress Notes (Signed)
PT Cancellation Note  Patient Details Name: ENDRE FRESCH MRN: [id number] DOB: 1966-05-03   Cancelled Treatment:    Reason Eval/Treat Not Completed: Patient declined, no reason specified. PT attempted to see patient for treatment however pt declining PT intervention at this time, reporting that he is preparing to discharge and does not want to mobilize prior to leaving. PT will continue to follow until discharge is complete.   Zenaida Niece 08/26/2019, 11:09 AM

## 2019-08-26 NOTE — Progress Notes (Signed)
[facility] Progress Note  Subjective: stable on  [place] O2, BP's fine - plan is for discharge today - he normally runs first shift at [facility]-- will see if can arrange for him to go there later today ?    Vitals:   08/25/19 2053 08/26/19 0052 08/26/19 0534 08/26/19 0812  BP:  (!) 139/59 140/81 132/72  Pulse:  78 85 85  Resp: 20 20    Temp: (!) 97.3 F (36.3 C) 98.4 F (36.9 C) 97.8 F (36.6 C) 98.6 F (37 C)  TempSrc: Oral Oral Oral Oral  SpO2:  96% 100% 92%  Weight:   (!) 140.6 kg   Height:        Inpatient medications: . amoxicillin-clavulanate  1 tablet Oral Q12H  . aspirin  81 mg Oral Daily  . atorvastatin  40 mg Oral Daily  . calcium carbonate  2 tablet Oral TID  . Chlorhexidine Gluconate Cloth  6 each Topical Q0600  . Chlorhexidine Gluconate Cloth  6 each Topical Q0600  . Chlorhexidine Gluconate Cloth  6 each Topical Q0600  . diclofenac sodium  2 g Topical QID  . doxercalciferol  1.5 mcg Intravenous Q M,W,F-HD  . ferric citrate  630 mg Oral TID WC  . heparin  5,000 Units Subcutaneous Q8H  . heparin  5,000 Units Dialysis Once in dialysis  . heparin  6,000 Units Dialysis Once in dialysis  . insulin aspart  2-6 Units Subcutaneous Q4H  . mouth rinse  15 mL Mouth Rinse BID  . mupirocin ointment  1 application Nasal BID  . pantoprazole  40 mg Oral Daily  . sertraline  50 mg Oral Daily    acetaminophen **OR** acetaminophen, guaiFENesin-dextromethorphan, loratadine, senna-docusate    Exam: General:obese, not in distress, calm and pleasant, 2L [place]: JVD not elevated. Chest: occ rhonchi, no rales Heart:RRR with normal S1, S2 Abdomen: Soft, non-tender, non-distended Musculoskeletal: Strength and tone appear normal for age. Lower extremities:No edema b/l lower extremities Neuro: alert, O x3 Access:LUE AVF + bruit, TDC in place   Dialysis: MWF [facility]   4h  138.5kg  2/2.5 bath  RIJ TDC/ LUA AVF  Hep 4500+ 1000/hr Epogen 1200units last  does 11/6 Hectorol 1.5 mcg IV q HD  Assessment/Plan: 1. Hyperkalemia: resolved w HD 2. Hypercarbic resp failure/ OSA/ OSH: on O2 at home, intubated for about 24 hrs for pCO2 > 100 and confusion. Extubated now, AMS resolved.  3. ESRD: on [facility]. At dry wt and stable sats, no vol excess on exam. HD due tomorrow, if stable for dc would prefer go to OP HD, we will take care of transport, etc., d/w patient who understands. His sodium is low which indicates fluid on board--  Likely needs lower EDW at discharge 4. HTN/ volume: did not have vol overload as cause or resp failure, but rather hypercarbia. Under dry wt but think it needs to be lower.  5. Anemia [id number] 11's. No ESA indicated.  6. Hypocalcemia:Calcium 6.1. Spoke to RN at outpatient HD unit on 11/9, last outpatient Ca was 8.1. RN reports patient is supposed to be tasking 2 Tums in between his meals. Takes auryxia 3 tabs per meal and 2 with snacks. Will restart tums, auryxia and hectorol. 7. Nutrition:Renal diet with fluid restrictions  8. Dispo-  For d/c today -  Also needs HD.  Will try to see if can run at OP unit-  He seems willing but not sure logistically is possible ?  Will enlist some resources.  If not is OK for discharge later after HD  Seth [name]  08/26/2019, 8:51 AM  Iron/TIBC/Ferritin/ %Sat    Component Value Date/Time   IRON 193 (H) 07/04/2015 1800   TIBC 221 (L) 07/04/2015 1800   FERRITIN 129 04/30/2012 1233   IRONPCTSAT 87 (H) 07/04/2015 1800   Recent Labs  Lab 08/23/19 0801  08/26/19 0700  NA 131*   < > 120*  K 6.1*   < > 4.8  CL 91*   < > 85*  CO2 22   < > 9*  GLUCOSE 140*   < > 100*  BUN 71*   < > 95*  CREATININE 14.24*   < > 14.63*  CALCIUM 6.5*   < > 7.3*  PHOS 11.1*  --   --   ALBUMIN 3.8  --   --    < > = values in this interval not displayed.   Recent Labs  Lab 08/23/19 0801  AST 16  ALT 8  ALKPHOS 140*  BILITOT 0.8  PROT 9.2*   Recent Labs   Lab 08/25/19 0718  WBC 12.7*  HGB 11.1*  HCT 35.5*  PLT 231
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
## 2019-08-26 NOTE — Care Management Important Message (Signed)
Important Message  Patient Details  Name: Seth [name] MRN: [id number] Date of Birth: July 30, 1966   Medicare Important Message Given:  Yes     Shelda Altes 08/26/2019, 1:32 PM
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
## 2019-08-26 NOTE — Telephone Encounter (Signed)
Patient with PTSD and is interested in seeing behavioral health. Will place referral.

## 2019-08-26 NOTE — Progress Notes (Signed)
Renal Navigator notes MD documentation that patient is being discharged today. Navigator spoke with Nephrologist, Primary/Dr. Koleen Distance and patient's OP HD clinic manager/Dana at [facility] to make arrangements for patient to have OP HD after discharge today.  Patient states his son can pick him up from the hospital and take him to his OP HD clinic and pick him up after treatment today. Renal Navigator has rescheduled his OP HD treatment for 3:00pm today. Patient is cleared for discharge from an OP HD treatment/Nephrology stand point and will not have HD in the hospital prior to discharge.  Alphonzo Cruise, [place] Renal Navigator [phone]

## 2019-08-26 NOTE — Progress Notes (Signed)
Patient placed on dream station in auto bipap mode with 3L of O2 bled in. Patient tolerating well at this time.

## 2019-08-27 ENCOUNTER — Ambulatory Visit (HOSPITAL_COMMUNITY): Payer: No Typology Code available for payment source

## 2019-08-27 ENCOUNTER — Telehealth: Payer: Self-pay

## 2019-08-27 ENCOUNTER — Encounter (HOSPITAL_COMMUNITY): Payer: Self-pay

## 2019-08-27 DIAGNOSIS — Z9989 Dependence on other enabling machines and devices: Secondary | ICD-10-CM | POA: Diagnosis not present

## 2019-08-27 DIAGNOSIS — G4733 Obstructive sleep apnea (adult) (pediatric): Secondary | ICD-10-CM | POA: Diagnosis not present

## 2019-08-27 DIAGNOSIS — N186 End stage renal disease: Secondary | ICD-10-CM | POA: Diagnosis not present

## 2019-08-27 DIAGNOSIS — F4310 Post-traumatic stress disorder, unspecified: Secondary | ICD-10-CM | POA: Diagnosis not present

## 2019-08-27 DIAGNOSIS — G9341 Metabolic encephalopathy: Secondary | ICD-10-CM | POA: Diagnosis not present

## 2019-08-27 DIAGNOSIS — E1122 Type 2 diabetes mellitus with diabetic chronic kidney disease: Secondary | ICD-10-CM | POA: Diagnosis not present

## 2019-08-27 DIAGNOSIS — Z9181 History of falling: Secondary | ICD-10-CM | POA: Diagnosis not present

## 2019-08-27 DIAGNOSIS — Z7982 Long term (current) use of aspirin: Secondary | ICD-10-CM | POA: Diagnosis not present

## 2019-08-27 DIAGNOSIS — Z992 Dependence on renal dialysis: Secondary | ICD-10-CM | POA: Diagnosis not present

## 2019-08-27 DIAGNOSIS — J9611 Chronic respiratory failure with hypoxia: Secondary | ICD-10-CM | POA: Diagnosis not present

## 2019-08-27 DIAGNOSIS — I120 Hypertensive chronic kidney disease with stage 5 chronic kidney disease or end stage renal disease: Secondary | ICD-10-CM | POA: Diagnosis not present

## 2019-08-27 DIAGNOSIS — M48061 Spinal stenosis, lumbar region without neurogenic claudication: Secondary | ICD-10-CM | POA: Diagnosis not present

## 2019-08-27 DIAGNOSIS — D631 Anemia in chronic kidney disease: Secondary | ICD-10-CM | POA: Diagnosis not present

## 2019-08-27 DIAGNOSIS — J9612 Chronic respiratory failure with hypercapnia: Secondary | ICD-10-CM | POA: Diagnosis not present

## 2019-08-27 NOTE — Telephone Encounter (Signed)
Amber with [facility] requesting VO for PT and nursing. Please call back.

## 2019-08-27 NOTE — Telephone Encounter (Signed)
Lm for rtc 

## 2019-08-27 NOTE — Telephone Encounter (Signed)
Amber, PT with [facility] called in requesting VO for [facility] PT 2 week 3 and 1 week 5 as well as HH RN Eval for diabetes management. Verbal auth given. Will route to PCP for agreement/denial. Hubbard Hartshorn, RN, BSN

## 2019-08-28 LAB — CULTURE, BLOOD (ROUTINE X 2)
Culture: NO GROWTH
Culture: NO GROWTH
Special Requests: ADEQUATE

## 2019-08-29 ENCOUNTER — Encounter: Payer: Self-pay | Admitting: Internal Medicine

## 2019-08-29 ENCOUNTER — Encounter: Payer: No Typology Code available for payment source | Admitting: Dietician

## 2019-08-29 ENCOUNTER — Other Ambulatory Visit: Payer: Self-pay

## 2019-08-29 ENCOUNTER — Telehealth: Payer: Self-pay | Admitting: Licensed Clinical Social Worker

## 2019-08-29 ENCOUNTER — Ambulatory Visit (INDEPENDENT_AMBULATORY_CARE_PROVIDER_SITE_OTHER): Payer: No Typology Code available for payment source | Admitting: Internal Medicine

## 2019-08-29 DIAGNOSIS — Z992 Dependence on renal dialysis: Secondary | ICD-10-CM | POA: Diagnosis not present

## 2019-08-29 DIAGNOSIS — E1159 Type 2 diabetes mellitus with other circulatory complications: Secondary | ICD-10-CM

## 2019-08-29 DIAGNOSIS — Z9115 Patient's noncompliance with renal dialysis: Secondary | ICD-10-CM | POA: Diagnosis not present

## 2019-08-29 DIAGNOSIS — F4310 Post-traumatic stress disorder, unspecified: Secondary | ICD-10-CM | POA: Diagnosis not present

## 2019-08-29 DIAGNOSIS — I120 Hypertensive chronic kidney disease with stage 5 chronic kidney disease or end stage renal disease: Secondary | ICD-10-CM

## 2019-08-29 DIAGNOSIS — Z794 Long term (current) use of insulin: Secondary | ICD-10-CM | POA: Diagnosis not present

## 2019-08-29 DIAGNOSIS — N186 End stage renal disease: Secondary | ICD-10-CM | POA: Diagnosis not present

## 2019-08-29 DIAGNOSIS — J9612 Chronic respiratory failure with hypercapnia: Secondary | ICD-10-CM

## 2019-08-29 DIAGNOSIS — J9611 Chronic respiratory failure with hypoxia: Secondary | ICD-10-CM | POA: Insufficient documentation

## 2019-08-29 DIAGNOSIS — Z79899 Other long term (current) drug therapy: Secondary | ICD-10-CM

## 2019-08-29 DIAGNOSIS — M545 Low back pain: Secondary | ICD-10-CM | POA: Diagnosis not present

## 2019-08-29 DIAGNOSIS — E1122 Type 2 diabetes mellitus with diabetic chronic kidney disease: Secondary | ICD-10-CM | POA: Diagnosis not present

## 2019-08-29 NOTE — Assessment & Plan Note (Signed)
Patient will be called by behavioral health specialist to schedule appointment. Patient recently started sertraline 50 mg QD. Patient reports adherence without reported side effects.
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
## 2019-08-29 NOTE — Assessment & Plan Note (Signed)
Blood pressure of 128/86, well controlled on current regimen of amlodipine 10 mg * Continue amlodipine 10 mg daily

## 2019-08-29 NOTE — Assessment & Plan Note (Signed)
Continues on HD on MWF. Patient was discharged on 08/26/2019 with hospital course complicated by 2 days in ICU due to respiratory failure thought to be secondary to overload in setting of a missed HD session. Patient weights today of 308 lb, down from 310 lb at discharge. Patient reports adhering to strict fluid restriction. Reports only 1.5 L taken off at HD yesterday, often requires 6L or more to be removed per patient.

## 2019-08-29 NOTE — Assessment & Plan Note (Signed)
Patient reports taking Tresiba 14 units daily. Denies taking short-acting insulin. Reports measures glucose about once per day and usually about 120. Hemoglobin A1C of 7.0 on 11/5 * Diabetes appears well controlled on current regimen. Continue with Tresiba 14 units daily
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
## 2019-08-29 NOTE — Patient Instructions (Signed)
You were seen in our clinic after your hospitalization.  It appears that your fluid level and your blood pressure are doing very well. Please continue your dialysis, medications, and lifestyle adjustments.  The behavioral health specialist will be in contact with you to schedule an appointment.  We will discuss the BiPap machine with our staff this afternoon and be in contact with you regarding next steps. If insurance does not approve it, we may need to perform further tests to justify your need for this device.  Thank you for allowing [place] to be part of your medical care!

## 2019-08-29 NOTE — Telephone Encounter (Signed)
Patient was called. Patient requested to be called back at a different time.

## 2019-08-29 NOTE — Assessment & Plan Note (Addendum)
He was hospitalized for hemodialysis and pain management (lower back) and hospital course was complicated by 2 days in ICU due to hypercarbic respiratory failure thought to be secondary to overload in the setting of a missed HD session.  ABG during hospitalization demonstrated hypercapnia and patient's friend reports episodes of drowsiness and disorientation at home which have been observed by home nursing staff. Patient would likely benefit from BiPap - currently uses CPAP at night. * Discussing with clinic staff regarding best way to obtain this device for patient

## 2019-08-30 ENCOUNTER — Ambulatory Visit: Payer: Self-pay | Admitting: Urology

## 2019-08-30 NOTE — Progress Notes (Signed)
Internal Medicine Clinic Attending  I saw and evaluated the patient.  I personally confirmed the key portions of the history and exam documented by Dr. MacLean and I reviewed pertinent patient test results.  The assessment, diagnosis, and plan were formulated together and I agree with the documentation in the resident's note.  

## 2019-08-31 NOTE — Telephone Encounter (Signed)
Did we establish if I am PCP for him? If so, will he need a face to face for home health?

## 2019-09-02 ENCOUNTER — Telehealth: Payer: Self-pay | Admitting: *Deleted

## 2019-09-02 NOTE — Telephone Encounter (Signed)
I agree with the [facility] orders. How do the PCPs get changed? Just curious if that was a program glitch or if someone has to personally change it. Thank you!

## 2019-09-02 NOTE — Telephone Encounter (Addendum)
Call made to patient  to make him aware that CMA is now handling his BIPAP request.  Pt has VA benefits and is currently using CPAP machine he received through [facility] [phone]).  During Aug 2020 hospitalization, CSW was working with [facility] to obtain BIPAP, but was unsuccessful. CMA spoke with Deatra at [facility] and she asked that I fax Aug discharge summary, BIPAP order, and last office notes to her at fax# [phone].  She will route info to Dr. Collins Scotland (pcp at [facility]).  Info faxed.Despina Hidden Cassady11/23/20204:34 PM

## 2019-09-09 DIAGNOSIS — Z992 Dependence on renal dialysis: Secondary | ICD-10-CM | POA: Diagnosis not present

## 2019-09-09 DIAGNOSIS — N186 End stage renal disease: Secondary | ICD-10-CM | POA: Diagnosis not present

## 2019-09-13 ENCOUNTER — Telehealth: Payer: Self-pay | Admitting: Internal Medicine

## 2019-09-13 NOTE — Telephone Encounter (Signed)
Per Lytle Michaels wanted physician to know, pt was schedule for 2 visit, pt was only able to do 1 visits

## 2019-09-16 ENCOUNTER — Other Ambulatory Visit: Payer: Self-pay | Admitting: Internal Medicine

## 2019-09-16 ENCOUNTER — Telehealth: Payer: Self-pay | Admitting: Internal Medicine

## 2019-09-16 DIAGNOSIS — E1122 Type 2 diabetes mellitus with diabetic chronic kidney disease: Secondary | ICD-10-CM

## 2019-09-16 DIAGNOSIS — Z992 Dependence on renal dialysis: Secondary | ICD-10-CM

## 2019-09-16 NOTE — Telephone Encounter (Signed)
Pt requesting a nurse to call [phone]

## 2019-09-16 NOTE — Telephone Encounter (Signed)
Pt was confused about which diab med to use, called pharm and spoke w/ pt 3 times, he is now clear he states and repeated back instructions

## 2019-09-16 NOTE — Telephone Encounter (Signed)
Refill Request-Pt rtn call to Triage Nurse  Pt contacted the pharmacy and states no prescription has been written for the following medication.   Semaglutide,0.25 or 0.5MG /DOS, (OZEMPIC, 0.25 OR 0.5 MG/DOSE,) 2 MG/1.5ML Seth [name], Seth [name]

## 2019-09-17 ENCOUNTER — Encounter: Payer: Self-pay | Admitting: Internal Medicine

## 2019-09-17 ENCOUNTER — Ambulatory Visit (INDEPENDENT_AMBULATORY_CARE_PROVIDER_SITE_OTHER): Payer: No Typology Code available for payment source | Admitting: Internal Medicine

## 2019-09-17 ENCOUNTER — Other Ambulatory Visit: Payer: Self-pay

## 2019-09-17 VITALS — BP 145/93 | HR 79 | Temp 98.1°F | Ht 67.5 in | Wt 302.2 lb

## 2019-09-17 DIAGNOSIS — F1190 Opioid use, unspecified, uncomplicated: Secondary | ICD-10-CM

## 2019-09-17 DIAGNOSIS — Z992 Dependence on renal dialysis: Secondary | ICD-10-CM

## 2019-09-17 DIAGNOSIS — E662 Morbid (severe) obesity with alveolar hypoventilation: Secondary | ICD-10-CM | POA: Diagnosis not present

## 2019-09-17 DIAGNOSIS — J9612 Chronic respiratory failure with hypercapnia: Secondary | ICD-10-CM

## 2019-09-17 DIAGNOSIS — E1122 Type 2 diabetes mellitus with diabetic chronic kidney disease: Secondary | ICD-10-CM | POA: Diagnosis not present

## 2019-09-17 DIAGNOSIS — J9611 Chronic respiratory failure with hypoxia: Secondary | ICD-10-CM | POA: Diagnosis not present

## 2019-09-17 DIAGNOSIS — N186 End stage renal disease: Secondary | ICD-10-CM

## 2019-09-17 MED ORDER — OZEMPIC (0.25 OR 0.5 MG/DOSE) 2 MG/1.5ML ~~LOC~~ SOPN: 0.2500 mg | PEN_INJECTOR | SUBCUTANEOUS | 3 refills | Status: AC

## 2019-09-17 NOTE — Patient Instructions (Signed)
Thank you for allowing [place] to provide your care. I will look into the referral to the pain clinic and for your BiPAP. I have sent out your new prescription. Please continue to take all your medications as prescribed. Please follow-up with Dr. Darrick Meigs in three months. At that point she can recheck your hemoglobin A1c and see if it's possible for you to come off the [place].

## 2019-09-17 NOTE — Progress Notes (Signed)
   CC: Chronic pain, chronic respiratory failure, DM  HPI:  Mr.Rynell B Serratore is a 53 y.o. male with PMHx listed below presenting for Chronic pain, chronic respiratory failure, DM. Please see the A&P for the status of the patient's chronic medical problems.  Past Medical History:  Diagnosis Date  . Acute on chronic respiratory failure with hypoxia and hypercapnia ([place]) 05/29/2019  . Diabetes mellitus    INSULIN DEPENDENT DIABETES  . Dialysis patient ([place])   . 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 ESRD (end stage renal disease) on dialysis [facility])    [facility] M/W/F  . Hypertension   . 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 Sleep apnea    USES CPAP  . Type 2 diabetes mellitus ([place])    Review of Systems:  Performed and all others negative.  Physical Exam: Vitals:   09/17/19 1530  BP: (!) 145/93  Pulse: 79  Temp: 98.1 F (36.7 C)  TempSrc: Oral  SpO2: 98%  Weight: (!) 302 lb 3.2 oz (137.1 kg)  Height: 5' 7.5" (1.715 m)   General: Morbidly obese male in no acute distress Pulm: Good air movement with no wheezing or crackles  CV: RRR, no murmurs, no rubs   Assessment & Plan:   See Encounters Tab for problem based charting.  Patient discussed with Dr. Dareen Piano

## 2019-09-17 NOTE — Assessment & Plan Note (Signed)
Patient presents the clinic today discussed restarting chronic opiate therapy. He was seen by his primary care provider on 11/9 when his opiates were discontinued at that time due to her recent hospitalization for acute on chronic respiratory failure. He was referred pain management however is not been to see them yet.  We discussed the benefits of getting the patient establish with pain management and the risks associated with opiate therapy. He voices understanding and will wait to establish with pain management.

## 2019-09-17 NOTE — Assessment & Plan Note (Signed)
Patient with well-controlled type II diabetes. He is currently on Tresiba 14 units daily. Dr. Maudie Mercury had called the patient to start Ozempic however wanted to be evaluated in person prior to doing so. He states that he checks his blood sugar once a day and is typically in the low 100s. He is not had any hypoglycemia. We discussed the side effects of Ozempic and the possibility of weight loss. He voices understanding.  He has lost 27 pounds in the last three weeks. He has been cutting out sugary beverages and decreasing his carb intake.  A/P: - Continue Tresiba 14 units QD. The patient may be able to discontinue this after starting the Ozempic given his CBG readings.  - Start Ozempic 0.25 mg [employer]

## 2019-09-17 NOTE — Assessment & Plan Note (Signed)
Patient with chronic hypercarbic respiratory failure. ABG performed during his last hospitalization illustrated persistently elevated PCO2. He would benefit from BiPAP therapy. We are currently working to get him BiPAP. This is a coordinated effort with the [place]. He will likely need an additional sleep study prior to getting his BiPAP.

## 2019-09-18 ENCOUNTER — Telehealth: Payer: Self-pay | Admitting: Licensed Clinical Social Worker

## 2019-09-18 NOTE — Telephone Encounter (Signed)
Patient agreed to services, and will be added to my schedule on 1/5.

## 2019-09-18 NOTE — Progress Notes (Signed)
Internal Medicine Clinic Attending  Case discussed with Dr. Helberg at the time of the visit.  We reviewed the resident's history and exam and pertinent patient test results.  I agree with the assessment, diagnosis, and plan of care documented in the resident's note.    

## 2019-09-25 ENCOUNTER — Other Ambulatory Visit: Payer: Self-pay | Admitting: *Deleted

## 2019-09-25 ENCOUNTER — Encounter: Payer: Self-pay | Admitting: Physical Medicine & Rehabilitation

## 2019-09-25 ENCOUNTER — Encounter: Payer: Medicare Other | Attending: Physical Medicine & Rehabilitation | Admitting: Physical Medicine & Rehabilitation

## 2019-09-25 ENCOUNTER — Other Ambulatory Visit: Payer: Self-pay

## 2019-09-25 VITALS — BP 178/97 | HR 91 | Temp 97.7°F | Ht 67.0 in | Wt 291.0 lb

## 2019-09-25 DIAGNOSIS — M4726 Other spondylosis with radiculopathy, lumbar region: Secondary | ICD-10-CM | POA: Diagnosis not present

## 2019-09-25 DIAGNOSIS — M1711 Unilateral primary osteoarthritis, right knee: Secondary | ICD-10-CM | POA: Diagnosis not present

## 2019-09-25 DIAGNOSIS — G9520 Unspecified cord compression: Secondary | ICD-10-CM | POA: Insufficient documentation

## 2019-09-25 NOTE — Patient Instructions (Signed)
PLEASE FEEL FREE TO CALL OUR OFFICE WITH ANY PROBLEMS OR QUESTIONS ([phone])      

## 2019-09-25 NOTE — Progress Notes (Signed)
Subjective:    [name], male    DOB: 19-Jul-1966, 53 y.o.   MRN: [id number]  HPI   Mr. 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  I last saw him in May 2018.  Over the last several months he is experiencing ongoing back and right knee pain. He las lost 40+lbs over the last few months.  He has been followed by internal medicine and ultimately was referred to me for further management.  He states that he has lost 40 pounds over the last several months but has increased his activity as a result because he has felt more energetic.  He does think that the increased activity has contributed to some of his pain.   An MRI of his lumbar spine was done in February of this year which revealed the following:  Moderate canal stenosis L5-S1 due to anterolisthesis. Limited assessment of neural foraminal narrowing due to lack of T1 sequences. Moderate suspected RIGHT L3-4 and severe bilateral L5-S1 neural foraminal narrowing.  I asked him what he was doing to treat his pain and he said he is on no specific medications nor is he doing anything topically for the pain.  He just essentially coping with it right now.  IMPRESSION: 1. Limited 3 sequence MRI of the lumbar spine without image findings of infection. 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 2. Grade 2 L5-S1 anterolisthesis.  No spondylolysis. 3. Moderate canal stenosis L5-S1 and severe L5-S1 neural foraminal Narrowing.    Pain Inventory Average Pain 9 Pain Right Now 10 My pain is sharp, burning, dull, tingling and aching  In the last 24 hours, has pain interfered with the following? General activity 6 Relation with others 6 Enjoyment of life 6 What TIME of day is your pain at its worst? varies Sleep (in general) Fair  Pain is worse with: walking and some activites Pain improves with: medication Relief from Meds:  4  Mobility walk with assistance use a cane ability to climb steps?  yes do you drive?  yes  Function retired  Neuro/Psych trouble walking spasms  Prior Studies Any changes since last visit?  no  Physicians involved in your care Any changes since last visit?  no   Family History  Problem Relation Age of Onset  . Diabetes Mother   . Cancer Mother   . Diabetes Sister   . Diabetes Brother    Social History   Socioeconomic History  . Marital status: Single    Spouse name: Not on file  . Number of children: Not on file  . Years of education: Not on file  . Highest education level: Not on file  Occupational History  . Not on file  Tobacco Use  . Smoking status: Never Smoker  . Smokeless tobacco: Never Used  Substance and Sexual Activity  . Alcohol use: No  . Drug use: Not Currently    Types: Marijuana  . Sexual activity: Not Currently    Birth control/protection: None  Other Topics Concern  . 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  Divorce with 5 children, 3 boys and 2 girls.  Lives in [place].    Social Determinants  of Health   Financial Resource Strain: High Risk  . Difficulty of Paying Living Expenses: Hard  Food Insecurity: No Food Insecurity  . Worried About Charity fundraiser in the Last Year: Never true  . Ran Out of Food in the Last Year: Never true  Transportation Needs: No Transportation Needs  . Lack of Transportation (Medical): No  . Lack of Transportation (Non-Medical): No  Physical Activity: Inactive  . Days of Exercise per Week: 0 days  . Minutes of Exercise per Session: 0 min  Stress: No Stress Concern Present  . Feeling of Stress : Only a little  Social Connections: Somewhat Isolated  . Frequency of Communication with Friends  and Family: More than three times a week  . Frequency of Social Gatherings with Friends and Family: Once a week  . Attends Religious Services: 1 to 4 times per year  . Active Member of Clubs or Organizations: No  . Attends Archivist Meetings: Never  . Marital Status: Divorced   Past Surgical History:  Procedure Laterality Date  . A/V FISTULAGRAM N/A 10/18/2018   Procedure: A/V FISTULAGRAM;  Surgeon: Marty Heck, MD;  Location: [place] [facility];  Service: Cardiovascular;  Laterality: N/A;  . A/V FISTULAGRAM Left 03/28/2019   Procedure: A/V Fistulagram;  Surgeon: Marty Heck, MD;  Location: [place] [facility];  Service: Cardiovascular;  Laterality: Left;  . AV FISTULA PLACEMENT  05/03/2012   Procedure: ARTERIOVENOUS (AV) FISTULA CREATION;  Surgeon: Mal [name], MD;  Location: [place];  Service: Vascular;  Laterality: Right;  . AV FISTULA PLACEMENT Left 10/23/2018   Procedure: ARTERIOVENOUS (AV) FISTULA CREATION;  Surgeon: Waynetta Sandy, MD;  Location: [place];  Service: Vascular;  Laterality: Left;  . BASCILIC VEIN TRANSPOSITION Left 04/02/2019   Procedure: BASILIC VEIN TRANSPOSITION SECOND STAGE LEFT;  Surgeon: Waynetta Sandy, MD;  Location: [place];  Service: Vascular;  Laterality: Left;  . INSERTION OF DIALYSIS CATHETER Left 12/03/2018   Procedure: INSERTION OF DIALYSIS CATHETER;  Surgeon: Marty Heck, MD;  Location: [place];  Service: Vascular;  Laterality: Left;  . INSERTION OF DIALYSIS CATHETER Left 06/03/2019   Procedure: INSERTION OF DIALYSIS CATHETER;  Surgeon: Marty Heck, MD;  Location: [place];  Service: Vascular;  Laterality: Left;  . ORIF MANDIBULAR FRACTURE N/A 11/10/2017   Procedure: OPEN REDUCTION INTERNAL FIXATION (ORIF) MANDIBULAR FRACTURE POSSIBLE MAXILLARY MANDIBULAR FIXATION;  Surgeon: Jodi Marble, MD;  Location: [place];  Service: ENT;  Laterality: N/A;  . REVISON OF ARTERIOVENOUS FISTULA Right 05/31/2018    Procedure: REVISION OF ARTERIOVENOUS FISTULA  RIGHT ARM WITH INTERPOSITION ARTEGRAFT 6MM X 16CM GRAFT;  Surgeon: Waynetta Sandy, MD;  Location: [place];  Service: Vascular;  Laterality: Right;  . TEE WITHOUT CARDIOVERSION N/A 07/10/2015   Procedure: TRANSESOPHAGEAL ECHOCARDIOGRAM (TEE);  Surgeon: Josue Hector, MD;  Location: [place];  Service: Cardiovascular;  Laterality: N/A;  . TEE WITHOUT CARDIOVERSION N/A 12/07/2018   Procedure: TRANSESOPHAGEAL ECHOCARDIOGRAM (TEE);  Surgeon: Acie Fredrickson Wonda Cheng, MD;  Location: [place];  Service: Cardiovascular;  Laterality: N/A;  . TEE WITHOUT CARDIOVERSION N/A 06/03/2019   Procedure: Transesophageal Echocardiogram (Tee);  Surgeon: Acie Fredrickson Wonda Cheng, MD;  Location: [place];  Service: Cardiovascular;  Laterality: N/A;  . tracheostomy removal    . TRACHEOSTOMY TUBE PLACEMENT N/A 08/20/2013   Procedure: TRACHEOSTOMY Revision;  Surgeon: Melida Quitter, MD;  Location: [place];  Service: ENT;  Laterality: N/A;  . UPPER EXTREMITY VENOGRAPHY Bilateral 03/28/2019  Procedure: UPPER EXTREMITY VENOGRAPHY;  Surgeon: Marty Heck, MD;  Location: [place] [facility];  Service: Cardiovascular;  Laterality: Bilateral;   Past Medical History:  Diagnosis Date  . Acute on chronic respiratory failure with hypoxia and hypercapnia ([place]) 05/29/2019  . Diabetes mellitus    INSULIN DEPENDENT DIABETES  . Dialysis patient ([place])   . 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 ESRD (end stage renal disease) on dialysis [facility])    [facility] M/W/F  . Hypertension   . 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 Sleep apnea    USES CPAP  . Type 2 diabetes mellitus ([name])    BP (!) 178/97   Pulse 91   Temp 97.7 F (36.5 C)   Ht 5\' 7"  (1.702 m)   Wt 291 lb (132 kg)   SpO2 96%   BMI 45.58 kg/m   Opioid Risk Score:   Fall Risk Score:  `1  Depression screen PHQ 2/9  Depression screen [facility] 2/9 09/17/2019 08/15/2019 06/24/2019  06/11/2019 04/29/2019 11/20/2018 07/10/2018  Decreased Interest 0 1 0 0 0 1 0  Down, Depressed, Hopeless 0 1 0 0 0 1 0  PHQ - 2 Score 0 2 0 0 0 2 0  Altered sleeping - 1 - - 0 1 -  Tired, decreased energy - 1 - - 0 0 -  Change in appetite - 1 - - 0 0 -  Feeling bad or failure about yourself  - 1 - - 0 0 -  Trouble concentrating - 1 - - 0 1 -  Moving slowly or fidgety/restless - 1 - 0 0 0 -  Suicidal thoughts - 1 - - - 0 -  PHQ-9 Score - 9 - - 0 4 -  Difficult doing work/chores Not difficult at all Somewhat difficult - Not difficult at all Not difficult at all Somewhat difficult -  Some recent data might be hidden   Review of Systems  Constitutional: Positive for unexpected weight change.       Trying to lose weight  Musculoskeletal: Positive for gait problem.       Spasms  All other systems reviewed and are negative.      Objective:   Physical Exam General: No acute distress. Morbidly obese HEENT: EOMI, oral membranes moist Cards: reg rate  Chest: normal effort Abdomen: Soft, NT, ND Skin: dry, intact Extremities: no edema  Musculoskeletal: continued antalgia right lower ext. Crepitus and joint line pain right knee with effusion. LBP with palpation at belt line. Extension and flexion both cause pain. SLR + as is facet testing Neuro: Strength B/l UE 5/5 throughout LLE hip flexion 5/5, ankle dorsi/plantar flexion 5/5 RLE: hip flexion 5/5, ankle dorsi/plantar flexion 5/5 DTR's 1+. Minimal sensory deficits.  Skin: Skin is warm and dry.    Psychiatric:pleasant   Assessment & Plan:  Medical Problem List and Plan: 1. Functional deficits secondary to Cord compression secondary to Osteomyelitis/diskitis T4 and T5 with pathologic fracture, able to move both LEs -continue cane for balance  -fair gait mechanics  2. Right knee pain/OA  -After informed consent and preparation of the skin with betadine and isopropyl alcohol, I injected 6mg  (1cc) of celestone and 4cc  of 1% lidocaine into the right knee  via anterolateral approach. Additionally, aspiration was performed prior to injection. The patient tolerated well, and no complications were encountered. Afterward the area was cleaned and dressed. Post- injection instructions were provided.          -  will send for xrays of right knee -ice to knee  -knee strengthening exercises 3. ESRD:HD per nephrology  4. Lumbar stenosis with radiculopathy as above  -will make referral to outpt therapies for ROM, strengthening, modalities, HEP  -consider ESI as well  30 minutes of face to face patient care time were spent during this visit. All questions were encouraged and answered.  Follow up with me in 6 weeks.

## 2019-09-26 ENCOUNTER — Other Ambulatory Visit: Payer: Self-pay | Admitting: Internal Medicine

## 2019-09-26 MED ORDER — SERTRALINE HCL 50 MG PO TABS: 50.0000 mg | ORAL_TABLET | Freq: Every day | ORAL | 0 refills | Status: DC

## 2019-09-26 NOTE — Telephone Encounter (Signed)
Needs refill on sertraline (ZOLOFT) 50 MG tablet  ;pt contact [place], [place] AT [place]

## 2019-09-26 NOTE — Telephone Encounter (Signed)
Sertraline # 30 sent to requested pharmacy today. Hubbard Hartshorn, BSN, RN-BC

## 2019-10-01 ENCOUNTER — Ambulatory Visit (INDEPENDENT_AMBULATORY_CARE_PROVIDER_SITE_OTHER): Payer: No Typology Code available for payment source | Admitting: Internal Medicine

## 2019-10-01 ENCOUNTER — Ambulatory Visit: Payer: Medicare Other | Attending: Physical Medicine & Rehabilitation | Admitting: Physical Therapy

## 2019-10-01 ENCOUNTER — Encounter: Payer: Self-pay | Admitting: Internal Medicine

## 2019-10-01 ENCOUNTER — Other Ambulatory Visit: Payer: Self-pay

## 2019-10-01 ENCOUNTER — Encounter: Payer: Self-pay | Admitting: Physical Therapy

## 2019-10-01 ENCOUNTER — Ambulatory Visit: Payer: Medicare Other | Admitting: Physical Therapy

## 2019-10-01 VITALS — BP 131/63 | HR 89 | Temp 98.3°F | Ht 67.0 in | Wt 287.7 lb

## 2019-10-01 DIAGNOSIS — M6281 Muscle weakness (generalized): Secondary | ICD-10-CM

## 2019-10-01 DIAGNOSIS — L02224 Furuncle of groin: Secondary | ICD-10-CM | POA: Diagnosis present

## 2019-10-01 DIAGNOSIS — M79609 Pain in unspecified limb: Secondary | ICD-10-CM | POA: Insufficient documentation

## 2019-10-01 DIAGNOSIS — R29898 Other symptoms and signs involving the musculoskeletal system: Secondary | ICD-10-CM | POA: Insufficient documentation

## 2019-10-01 NOTE — Patient Instructions (Signed)
Access Code: [email or web address]  URL: [email or web address]  Date: 10/01/2019  Prepared by: Jeral Pinch   Exercises Seated Hamstring Stretch - 2-3 reps - 20 hold - 1x daily Seated Long Arc Quad - 10 reps - 3 sets - 3-5 hold - 1x daily Sit to Stand without Arm Support - 10 reps - 1-3 sets - 1x daily Patient Education TENS Unit TENS Therapy

## 2019-10-01 NOTE — Assessment & Plan Note (Addendum)
Patient presents with groin furuncle that has been present for the past 2-3 days.  This is uncomfortable for him when he is wearing clothes but not necessarily painful.  He has not noted any other rashes or lesions.  He denies systemic signs of infection. On exam, there is a small from furuncle in the groin at the base of the penis that is not associated with erythema or warmth. I advised to use warm compresses 2-3x per day. Return precautions discussed.

## 2019-10-01 NOTE — Progress Notes (Signed)
Internal Medicine Clinic Attending  Case discussed with Dr. Santos-Sanchez at the time of the visit.  We reviewed the resident's history and exam and pertinent patient test results.  I agree with the assessment, diagnosis, and plan of care documented in the resident's note.    

## 2019-10-01 NOTE — Therapy (Signed)
Seth [name], [place], [address] Phone: [phone]   Fax:  [phone]  Physical Therapy Evaluation  Patient Details  Name: Seth [name] MRN: [id number] Date of Birth: Feb 02, 1966 Referring Provider (PT): Seth [name]   Encounter Date: 10/01/2019  PT End of Session - 10/01/19 1301    Visit Number  1    Number of Visits  12    Date for PT Re-Evaluation  11/12/19    Authorization Type  MCR p-note 10th visit    PT Start Time  1301    PT Stop Time  1354    PT Time Calculation (min)  53 min    Activity Tolerance  Patient limited by pain    Behavior During Therapy  [facility] for tasks assessed/performed       Past Medical History:  Diagnosis Date  . Acute on chronic respiratory failure with hypoxia and hypercapnia (Seth [name]) 05/29/2019  . Diabetes mellitus    INSULIN DEPENDENT DIABETES  . Dialysis patient ([place])   . 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 ESRD (end stage renal disease) on dialysis Seth [name])    [facility] M/W/F  . Hypertension   . RETINAL DETACHMENT, HX OF 06/20/2007   Qualifier: Diagnosis of  By: Seth [name]    . Sleep apnea    USES CPAP  . Type 2 diabetes mellitus ([place])     Past Surgical History:  Procedure Laterality Date  . A/V FISTULAGRAM Seth [name] 10/18/2018   Procedure: A/V FISTULAGRAM;  Surgeon: Seth Heck, MD;  Location: [place] [facility];  Service: Cardiovascular;  Laterality: Seth [name];  . A/V FISTULAGRAM Left 03/28/2019   Procedure: A/V Fistulagram;  Surgeon: Seth Heck, MD;  Location: [place] [facility];  Service: Cardiovascular;  Laterality: Left;  . AV FISTULA PLACEMENT  05/03/2012   Procedure: ARTERIOVENOUS (AV) FISTULA CREATION;  Surgeon: Seth [name], MD;  Location: [place];  Service: Vascular;  Laterality: Right;  . AV FISTULA PLACEMENT Left 10/23/2018   Procedure: ARTERIOVENOUS (AV) FISTULA CREATION;  Surgeon: Seth Sandy, MD;  Location: [place];  Service:  Vascular;  Laterality: Left;  . BASCILIC VEIN TRANSPOSITION Left 04/02/2019   Procedure: BASILIC VEIN TRANSPOSITION SECOND STAGE LEFT;  Surgeon: Seth Sandy, MD;  Location: [place];  Service: Vascular;  Laterality: Left;  . INSERTION OF DIALYSIS CATHETER Left 12/03/2018   Procedure: INSERTION OF DIALYSIS CATHETER;  Surgeon: Seth Heck, MD;  Location: [place];  Service: Vascular;  Laterality: Left;  . INSERTION OF DIALYSIS CATHETER Left 06/03/2019   Procedure: INSERTION OF DIALYSIS CATHETER;  Surgeon: Seth Heck, MD;  Location: [place];  Service: Vascular;  Laterality: Left;  . ORIF MANDIBULAR FRACTURE Seth [name] 11/10/2017   Procedure: OPEN REDUCTION INTERNAL FIXATION (ORIF) MANDIBULAR FRACTURE POSSIBLE MAXILLARY MANDIBULAR FIXATION;  Surgeon: Seth Marble, MD;  Location: [facility];  Service: ENT;  Laterality: Seth [name];  . REVISON OF ARTERIOVENOUS FISTULA Right 05/31/2018   Procedure: REVISION OF ARTERIOVENOUS FISTULA  RIGHT ARM WITH INTERPOSITION ARTEGRAFT 6MM X 16CM GRAFT;  Surgeon: Seth Sandy, MD;  Location: [place];  Service: Vascular;  Laterality: Right;  . TEE WITHOUT CARDIOVERSION Seth [name] 07/10/2015   Procedure: TRANSESOPHAGEAL ECHOCARDIOGRAM (TEE);  Surgeon: Seth Hector, MD;  Location: [place];  Service: Cardiovascular;  Laterality: Seth [name];  . TEE WITHOUT CARDIOVERSION Seth [name] 12/07/2018   Procedure: TRANSESOPHAGEAL ECHOCARDIOGRAM (TEE);  Surgeon: Seth Fredrickson Wonda Cheng, MD;  Location: [place];  Service: Cardiovascular;  Laterality: Seth [name];  .  TEE WITHOUT CARDIOVERSION Seth [name] 06/03/2019   Procedure: Transesophageal Echocardiogram (Tee);  Surgeon: Seth Fredrickson Wonda Cheng, MD;  Location: [place];  Service: Cardiovascular;  Laterality: Seth [name];  . tracheostomy removal    . TRACHEOSTOMY TUBE PLACEMENT Seth [name] 08/20/2013   Procedure: TRACHEOSTOMY Revision;  Surgeon: Seth Quitter, MD;  Location: [facility];  Service: ENT;  Laterality: Seth [name];  . UPPER EXTREMITY VENOGRAPHY Bilateral 03/28/2019   Procedure: UPPER  EXTREMITY VENOGRAPHY;  Surgeon: Seth Heck, MD;  Location: [facility];  Service: Cardiovascular;  Laterality: Bilateral;    There were no vitals filed for this visit.   Subjective Assessment - 10/01/19 1303    Subjective  Pt reports he has aching in his low back for years had been using pain meds to maintain his function. He has been taken off the meds and referred to pain management. He stopped them two months ago. He started a diet and has loss 50# so far since October.    How long can you walk comfortably?  55yrds    Patient Stated Goals  get better and reduce the knee and back pain,    Currently in Pain?  Yes    Pain Score  9     Pain Location  Back    Pain Orientation  Left    Pain Descriptors / Indicators  Aching;Nagging    Pain Type  Chronic pain    Pain Radiating Towards  Rt knee also 7/10    Pain Onset  More than a month ago    Pain Frequency  Constant    Aggravating Factors   walking, standing    Pain Relieving Factors  pain meds used to help - not on them any more         [name] PT Assessment - 10/01/19 0001      Assessment   Medical Diagnosis  Rt knee pain, cord compression with lumbar spondylosis    Referring Provider (PT)  Seth [name]    Onset Date/Surgical Date  09/30/17    Hand Dominance  --   since surgery   Next MD Visit  11/25/2019    Prior Therapy  yes for his knee, and some for the back      Precautions   Precaution Comments  shunts bilat UEs for dialysis      Balance Screen   Has the patient fallen in the past 6 months  No    Has the patient had a decrease in activity level because of a fear of falling?   No    Is the patient reluctant to leave their home because of a fear of falling?   No      Home Environment   Living Environment  Private residence    Living Arrangements  Spouse/significant other;Children    [place] to enter   2  has to lean on wooden [place]  One level      Prior Function   Level  of Independence  Independent    Vocation  On disability    Leisure  has children 7 yr - 27 yr old      Posture/Postural Control   Posture/Postural Control  Postural limitations   obses     ROM / Strength   AROM / PROM / Strength  AROM;Strength      AROM   AROM Assessment Site  Lumbar;Knee    Right/Left Knee  --   limited d/t soft tissue approximation  Lumbar Flexion  to top of ankles    Lumbar Extension  just past neutral  with increased pain    Lumbar - Right Rotation  90% present    Lumbar - Left Rotation  50% present with pain      Strength   Strength Assessment Site  Hip;Knee;Ankle    Right/Left Hip  --   flexion 5/5, abduct 4/5, extens 4/5   Right/Left Knee  Right   Lt 5/5   Right Knee Flexion  4/5    Right Knee Extension  4+/5   with pain   Right/Left Ankle  --   5/5     Palpation   Spinal mobility  NA d/t size  and not able to get prone.     Palpation comment  tight banding in Lt lumbar paraspinals with tenderness. Tender around Rt patellar tendon and bilat tibial plataues.       Transfers   Transfers  Sit to Stand    Sit to Stand  With armrests;With upper extremity assist;Uncontrolled descent      Ambulation/Gait   Assistive device  Straight cane    Gait Pattern  Decreased hip/knee flexion - right;Decreased hip/knee flexion - left;Right circumduction;Left circumduction   wid eBOS               Objective measurements completed on examination: See above findings.      [facility] Adult PT Treatment/Exercise - 10/01/19 0001      Exercises   Exercises  Other Exercises    Other Exercises   LAQ, seated hamstring, sit to stand per HEP      Modalities   Modalities  Electrical Stimulation;Moist Heat      Moist Heat Therapy   Number Minutes Moist Heat  15 Minutes    Moist Heat Location  Lumbar Spine;Knee   Rt knee - in a chair     Electrical Stimulation   Electrical Stimulation Location  Rt knee and low back - seated    Electrical Stimulation  Action  premod    Electrical Stimulation Parameters  to tolerance    Electrical Stimulation Goals  Pain             PT Education - 10/01/19 1354    Education Details  HEP,POC, home TENs unit    Person(s) Educated  Patient    Methods  Explanation;Demonstration;Handout    Comprehension  Returned demonstration;Verbalized understanding          PT Long Term Goals - 10/01/19 1402      PT LONG TERM GOAL #1   Title  The patient will be independent in safe self progression of HEP for further strengthening   11/12/19    Time  6    Period  Weeks    Status  New    Target Date  11/12/19      PT LONG TERM GOAL #2   Title  improve lower body strength to allow him to transfer sit to stand without UE support ( 11/12/2019)    Time  6    Period  Weeks    Status  New    Target Date  11/12/19      PT LONG TERM GOAL #3   Title  report =/> 50% reduction in overall pain with use of home TENs machine and exercise ( 11/12/2019)    Time  6    Period  Weeks    Status  New    Target Date  11/12/19  PT LONG TERM GOAL #4   Title  ambulate with minimal gait devaitions on level surfaces ( 11/12/2019)    Time  6    Period  Weeks    Status  New    Target Date  11/12/19      PT LONG TERM GOAL #5   Title  -------             Plan - 10/01/19 1355    Clinical Impression Statement  53 yo male with > 2 yrs h/o low back pain that has not improved much since his lumbar surgery a couple years ago, also refered for Rt knee pain.  He was using medication to assist with making pain tolerable.  MDs want him off these so he was referred to pain management and then to [place].  He did well with e-stim at eval and will most likely order one for home use.  He is a large man, reports being on a diet and losing wt since October.  He recieves dialysis M-W-F every week, has sleep apnea, DM, and HTN from kidney failure.  He is raising multiple kids and has to stay active for this. He reports he does some exercise  every day uses 5# hand wts. He ambulates with a cane, has gait deviations, weakness, muslce spasm in Lt lumbar are and pain in Rt knee and low back.    Personal Factors and Comorbidities  Behavior Pattern;Past/Current Experience;Fitness;Comorbidity 3+;Time since onset of injury/illness/exacerbation    Comorbidities  DM, HTN , ESRD with diaylsis, sleep apnea, lumbar surgery and thoracic fusion    Examination-Activity Limitations  Bathing;Locomotion Level;Transfers;Sleep;Squat;Stand    Examination-Participation Restrictions  Community Activity;Other    Stability/Clinical Decision Making  Evolving/Moderate complexity    Clinical Decision Making  Moderate    Rehab Potential  Good    PT Frequency  2x / week    PT Duration  6 weeks    PT Treatment/Interventions  Gait training;Taping;Vasopneumatic Device;Patient/family education;Functional mobility training;Moist Heat;Ultrasound;Cryotherapy;Scientist, product/process development;Therapeutic exercise;Neuromuscular re-education;Manual techniques;Dry needling    PT Next Visit Plan  assess response to stim and interest in home unit for pain management. core and lower body strengthening    Consulted and Agree with Plan of Care  Patient       Patient will benefit from skilled therapeutic intervention in order to improve the following deficits and impairments:  Abnormal gait, Decreased range of motion, Obesity, Increased muscle spasms, Pain, Decreased strength  Visit Diagnosis: Muscle weakness (generalized)  Other symptoms and signs involving the musculoskeletal system  Pain in extremity, unspecified extremity     Problem List Patient Active Problem List   Diagnosis Date Noted  . Other spondylosis with radiculopathy, lumbar region 09/25/2019  . Chronic respiratory failure with hypoxia and hypercapnia ([place]) 08/29/2019  . PTSD (post-traumatic stress disorder) 08/29/2019  . Pressure injury of skin 08/23/2019  . Acute kidney injury ([place]) 08/22/2019   . Hypoxia 08/16/2019  . Eczema 08/16/2019  . Chronic, continuous use of opioids 08/16/2019  . Furuncles 04/30/2019  . NSTEMI (non-ST elevated myocardial infarction) ([place]) 11/29/2018  . Hyponatremia 11/29/2018  . Hyperkalemia 09/03/2018  . Nightmares 12/28/2017  . Preventative health care 10/01/2015  . Primary osteoarthritis of right knee   . Cord compression myelopathy ([place]) 08/17/2015  . Muscle pain, lumbar 08/11/2015  . Controlled type 2 diabetes mellitus with chronic kidney disease on chronic dialysis ([place]) 07/20/2015  . Absolute anemia   . OSA (obstructive sleep apnea) 10/14/2013  . ESRD (end stage  renal disease) on dialysis ([place]) 04/30/2012  . Hyperlipidemia associated with type 2 diabetes mellitus ([place]) 06/20/2007  . Obesity hypoventilation syndrome ([place]) 06/20/2007  . Hypertension associated with diabetes ([place]) 06/20/2007    Jeral Pinch PT  10/01/2019, 2:09 PM  [facility] [address] [place], [place], [address] Phone: [phone]   Fax:  [phone]  Name: Seth [name] MRN: [id number] Date of Birth: 04/05/1966

## 2019-10-01 NOTE — Progress Notes (Signed)
   CC: Hair bumps   HPI:  [name] is a 53 y.o. year-old male with PMH listed below who presents to clinic for hair bumps. Please see problem based assessment and plan for further details.   Past Medical History:  Diagnosis Date  . Acute on chronic respiratory failure with hypoxia and hypercapnia ([place]) 05/29/2019  . Diabetes mellitus    INSULIN DEPENDENT DIABETES  . Dialysis patient ([place])   . 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 ESRD (end stage renal disease) on dialysis [facility])    [facility] M/W/F  . Hypertension   . 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 Sleep apnea    USES CPAP  . Type 2 diabetes mellitus ([place])    Review of Systems:   Review of Systems  Constitutional: Negative for chills, fever and malaise/fatigue.  Genitourinary:       Furuncle in groin      Physical Exam:  Vitals:   10/01/19 1444  Weight: 287 lb 11.2 oz (130.5 kg)  Height: 5\' 7"  (1.702 m)    General: Well-appearing male in no acute distress Derm: There is a small furuncle in the groin superior to base of penis without associated erythema or warmth.  No other rashes or lesions noted.   Assessment & Plan:   See Encounters Tab for problem based charting.  Patient discussed with Dr. Evette Doffing

## 2019-10-01 NOTE — Patient Instructions (Signed)
Mr. Krenz,   You have a boil in your groin. These usually get better within a week with warm compresses 2-3 times per day. You can also topical antibiotic if you would like to.   If is does not get any better within a week, give [place] a call and let us know.   - Dr. Frederico Hamman

## 2019-10-08 ENCOUNTER — Encounter: Payer: Self-pay | Admitting: Physical Therapy

## 2019-10-08 ENCOUNTER — Other Ambulatory Visit: Payer: Self-pay

## 2019-10-08 ENCOUNTER — Ambulatory Visit: Payer: Medicare Other | Admitting: Physical Therapy

## 2019-10-08 DIAGNOSIS — M6281 Muscle weakness (generalized): Secondary | ICD-10-CM

## 2019-10-08 DIAGNOSIS — R29898 Other symptoms and signs involving the musculoskeletal system: Secondary | ICD-10-CM

## 2019-10-08 DIAGNOSIS — M79609 Pain in unspecified limb: Secondary | ICD-10-CM

## 2019-10-08 NOTE — Therapy (Signed)
[place], [place], [address] Phone: [phone]   Fax:  [phone]  Physical Therapy Treatment  Patient Details  Name: Seth [name] MRN: [id number] Date of Birth: 1966/01/28 Referring Provider (PT): Dr Oval Linsey   Encounter Date: 10/08/2019  PT End of Session - 10/08/19 0733    Visit Number  2    Number of Visits  12    Date for PT Re-Evaluation  11/12/19    Authorization Type  MCR p-note 10th visit    PT Start Time  0728    PT Stop Time  0810    PT Time Calculation (min)  42 min       Past Medical History:  Diagnosis Date  . Acute on chronic respiratory failure with hypoxia and hypercapnia ([place]) 05/29/2019  . Diabetes mellitus    INSULIN DEPENDENT DIABETES  . Dialysis patient ([place])   . 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 ESRD (end stage renal disease) on dialysis [facility])    [facility] M/W/F  . Hypertension   . 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 Sleep apnea    USES CPAP  . Type 2 diabetes mellitus ([place])     Past Surgical History:  Procedure Laterality Date  . A/V FISTULAGRAM N/A 10/18/2018   Procedure: A/V FISTULAGRAM;  Surgeon: Marty Heck, MD;  Location: [place] [facility];  Service: Cardiovascular;  Laterality: N/A;  . A/V FISTULAGRAM Left 03/28/2019   Procedure: A/V Fistulagram;  Surgeon: Marty Heck, MD;  Location: [place] [facility];  Service: Cardiovascular;  Laterality: Left;  . AV FISTULA PLACEMENT  05/03/2012   Procedure: ARTERIOVENOUS (AV) FISTULA CREATION;  Surgeon: Mal [name], MD;  Location: [place];  Service: Vascular;  Laterality: Right;  . AV FISTULA PLACEMENT Left 10/23/2018   Procedure: ARTERIOVENOUS (AV) FISTULA CREATION;  Surgeon: Waynetta Sandy, MD;  Location: [facility];  Service: Vascular;  Laterality: Left;  . BASCILIC VEIN TRANSPOSITION Left 04/02/2019   Procedure: BASILIC VEIN  TRANSPOSITION SECOND STAGE LEFT;  Surgeon: Waynetta Sandy, MD;  Location: [facility];  Service: Vascular;  Laterality: Left;  . INSERTION OF DIALYSIS CATHETER Left 12/03/2018   Procedure: INSERTION OF DIALYSIS CATHETER;  Surgeon: Marty Heck, MD;  Location: [place];  Service: Vascular;  Laterality: Left;  . INSERTION OF DIALYSIS CATHETER Left 06/03/2019   Procedure: INSERTION OF DIALYSIS CATHETER;  Surgeon: Marty Heck, MD;  Location: [place];  Service: Vascular;  Laterality: Left;  . ORIF MANDIBULAR FRACTURE N/A 11/10/2017   Procedure: OPEN REDUCTION INTERNAL FIXATION (ORIF) MANDIBULAR FRACTURE POSSIBLE MAXILLARY MANDIBULAR FIXATION;  Surgeon: Jodi Marble, MD;  Location: [facility];  Service: ENT;  Laterality: N/A;  . REVISON OF ARTERIOVENOUS FISTULA Right 05/31/2018   Procedure: REVISION OF ARTERIOVENOUS FISTULA  RIGHT ARM WITH INTERPOSITION ARTEGRAFT 6MM X 16CM GRAFT;  Surgeon: Waynetta Sandy, MD;  Location: [place];  Service: Vascular;  Laterality: Right;  . TEE WITHOUT CARDIOVERSION N/A 07/10/2015   Procedure: TRANSESOPHAGEAL ECHOCARDIOGRAM (TEE);  Surgeon: Josue Hector, MD;  Location: [place];  Service: Cardiovascular;  Laterality: N/A;  . TEE WITHOUT CARDIOVERSION N/A 12/07/2018   Procedure: TRANSESOPHAGEAL ECHOCARDIOGRAM (TEE);  Surgeon: Acie Fredrickson Wonda Cheng, MD;  Location: [place];  Service: Cardiovascular;  Laterality: N/A;  . TEE WITHOUT CARDIOVERSION N/A 06/03/2019   Procedure: Transesophageal Echocardiogram (Tee);  Surgeon: Thayer Headings, MD;  Location: [place];  Service: Cardiovascular;  Laterality: N/A;  . tracheostomy removal    . TRACHEOSTOMY TUBE PLACEMENT N/A 08/20/2013   Procedure: TRACHEOSTOMY Revision;  Surgeon: Melida Quitter, MD;  Location: [place];  Service: ENT;  Laterality: N/A;  . UPPER EXTREMITY VENOGRAPHY Bilateral 03/28/2019   Procedure: UPPER EXTREMITY VENOGRAPHY;  Surgeon: Marty Heck, MD;  Location: [place] [facility];  Service:  Cardiovascular;  Laterality: Bilateral;    There were no vitals filed for this visit.                    [place] Adult PT Treatment/Exercise - 10/08/19 0001      Lumbar Exercises: Supine   Pelvic Tilt  20 reps;5 seconds    Pelvic Tilt Limitations  cues for breathing     Bent Knee Raise  20 reps    Bent Knee Raise Limitations  with abdominal draw in       Knee/Hip Exercises: Seated   Long Arc Quad  20 reps    Long Arc Quad Weight  5 lbs.    Long [employer] Limitations  also shown how to use green band     [facility]  x 20      Clamshell with TheraBand  Green    Marching  10 reps    Marching Limitations  5    Sit to [employer]  10 reps   with out UE, elevated mat table      Moist Heat Therapy   Moist Heat Location  Lumbar Spine;Knee   Rt knee - in a chair     Acupuncturist Stimulation Location  Rt knee and Low back     Electrical Stimulation Action  pre mod     Electrical Stimulation Parameters  to tolerance     Electrical Stimulation Goals  Pain             PT Education - 10/08/19 [id number]    Education Details  HEP    Person(s) Educated  Patient    Methods  Explanation;Handout    Comprehension  Verbalized understanding          PT Long Term Goals - 10/01/19 1402      PT LONG TERM GOAL #1   Title  The patient will be independent in safe self progression of HEP for further strengthening   11/12/19    Time  6    Period  Weeks    Status  New    Target Date  11/12/19      PT LONG TERM GOAL #2   Title  improve lower body strength to allow him to transfer sit to stand without UE support ( 11/12/2019)    Time  6    Period  Weeks    Status  New    Target Date  11/12/19      PT LONG TERM GOAL #3   Title  report =/> 50% reduction in overall pain with use of home TENs machine and exercise ( 11/12/2019)    Time  6    Period  Weeks    Status  New    Target Date  11/12/19      PT LONG TERM GOAL #4   Title  ambulate with minimal gait  devaitions on level surfaces ( 11/12/2019)    Time  6    Period  Weeks    Status  New    Target Date  11/12/19  PT LONG TERM GOAL #5   Title  -------            Plan - 10/08/19 [id number]    Clinical Impression Statement  Pt arrives reporting 8-9/10 pain in right knee and low back. He felt the IFC helped a little and requests to repeat that treatment today.Progressed seated and supine therex to include LE and Lumbar. Updated HEP. No c/o during session. Felt a good stretch with lower trunk rotations.    PT Next Visit Plan  assess response to stim and interest in home unit for pain management. core and lower body strengthening    PT Home Exercise Plan  LAW, Sit-stand, Hamstring stretch seated, added pelvic tilit, supine marching, seated clam and seated ball squeeze, LTR       Patient will benefit from skilled therapeutic intervention in order to improve the following deficits and impairments:  Abnormal gait, Decreased range of motion, Obesity, Increased muscle spasms, Pain, Decreased strength  Visit Diagnosis: Muscle weakness (generalized)  Other symptoms and signs involving the musculoskeletal system  Pain in extremity, unspecified extremity     Problem List Patient Active Problem List   Diagnosis Date Noted  . Furuncle of groin 10/01/2019  . Other spondylosis with radiculopathy, lumbar region 09/25/2019  . Chronic respiratory failure with hypoxia and hypercapnia ([place]) 08/29/2019  . PTSD (post-traumatic stress disorder) 08/29/2019  . Pressure injury of skin 08/23/2019  . Acute kidney injury ([place]) 08/22/2019  . Hypoxia 08/16/2019  . Eczema 08/16/2019  . Chronic, continuous use of opioids 08/16/2019  . Furuncles 04/30/2019  . NSTEMI (non-ST elevated myocardial infarction) ([place]) 11/29/2018  . Hyponatremia 11/29/2018  . Hyperkalemia 09/03/2018  . Nightmares 12/28/2017  . Preventative health care 10/01/2015  . Primary osteoarthritis of right knee   . Cord compression  myelopathy ([place]) 08/17/2015  . Muscle pain, lumbar 08/11/2015  . Controlled type 2 diabetes mellitus with chronic kidney disease on chronic dialysis ([place]) 07/20/2015  . Absolute anemia   . OSA (obstructive sleep apnea) 10/14/2013  . ESRD (end stage renal disease) on dialysis ([place]) 04/30/2012  . Hyperlipidemia associated with type 2 diabetes mellitus ([place]) 06/20/2007  . Obesity hypoventilation syndrome ([place]) 06/20/2007  . Hypertension associated with diabetes ([place]) 06/20/2007    Dorene Ar, PTA 10/08/2019, 8:48 AM  [facility] [address] [place], [place], [address] Phone: [phone]   Fax:  [phone]  Name: Seth [name] MRN: [id number] Date of Birth: July 13, 1966

## 2019-10-14 ENCOUNTER — Ambulatory Visit: Payer: Medicare Other | Attending: Physical Medicine & Rehabilitation | Admitting: Physical Therapy

## 2019-10-14 DIAGNOSIS — M79609 Pain in unspecified limb: Secondary | ICD-10-CM | POA: Insufficient documentation

## 2019-10-14 DIAGNOSIS — M6281 Muscle weakness (generalized): Secondary | ICD-10-CM | POA: Insufficient documentation

## 2019-10-14 DIAGNOSIS — R29898 Other symptoms and signs involving the musculoskeletal system: Secondary | ICD-10-CM | POA: Insufficient documentation

## 2019-10-15 ENCOUNTER — Other Ambulatory Visit: Payer: Self-pay

## 2019-10-15 ENCOUNTER — Ambulatory Visit (INDEPENDENT_AMBULATORY_CARE_PROVIDER_SITE_OTHER): Payer: No Typology Code available for payment source | Admitting: Internal Medicine

## 2019-10-15 ENCOUNTER — Ambulatory Visit (INDEPENDENT_AMBULATORY_CARE_PROVIDER_SITE_OTHER): Payer: No Typology Code available for payment source | Admitting: Licensed Clinical Social Worker

## 2019-10-15 ENCOUNTER — Encounter: Payer: Self-pay | Admitting: Internal Medicine

## 2019-10-15 DIAGNOSIS — Z8614 Personal history of Methicillin resistant Staphylococcus aureus infection: Secondary | ICD-10-CM

## 2019-10-15 DIAGNOSIS — L03211 Cellulitis of face: Secondary | ICD-10-CM | POA: Diagnosis not present

## 2019-10-15 DIAGNOSIS — L03314 Cellulitis of groin: Secondary | ICD-10-CM

## 2019-10-15 DIAGNOSIS — F4310 Post-traumatic stress disorder, unspecified: Secondary | ICD-10-CM

## 2019-10-15 DIAGNOSIS — L03311 Cellulitis of abdominal wall: Secondary | ICD-10-CM | POA: Diagnosis not present

## 2019-10-15 DIAGNOSIS — L0390 Cellulitis, unspecified: Secondary | ICD-10-CM | POA: Insufficient documentation

## 2019-10-15 MED ORDER — DOXYCYCLINE MONOHYDRATE 100 MG PO TABS: 100.0000 mg | ORAL_TABLET | Freq: Two times a day (BID) | ORAL | 0 refills | Status: AC

## 2019-10-15 NOTE — Patient Instructions (Signed)
Thank you for allowing [place] to provide your care. I think you likely had an epidermal exist that popped and subsequently got infected. You can use warm compresses anytime these arise but try not to attempt to pop or open them on your own. I'm sending out a prescription for doxycycline. You'll take this for five days. It is important that you remain upright after taking this medication. If you notice fevers, chills, worsening redness, or worsening pain please call the Clinic for further instruction.
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
## 2019-10-15 NOTE — Assessment & Plan Note (Signed)
Patient presents with one week of multiple bumps. He noted approximately one week ago he developed bumps/lesions on his right face right abdomen and left groin. He has had similar lesions in the past. He has been trying to pop the lesion on his right abdomen. It subsequently drained white material. The lesion in his left groin and right face is also draining white material. He is noticed increasing discomfort and erythema around the lesion on his right abdomen after attempting to drain it. He has had no systemic signs of infection including fevers, chills, nausea/vomiting, decreased PO intake.  A/P: - We discussed that these lesions were likely initially epidermal cysts. Which typically do not require antibiotic therapy; however, the lesion on his right abdomen now appears secondarily infected with worsening erythema and pain. He has a history of MRSA. - Will prescribed doxycycline 100 mg twice daily with food. Discussed possible side effects including photosensitivity and pill esophagitis.

## 2019-10-16 ENCOUNTER — Telehealth: Payer: Self-pay | Admitting: Physical Therapy

## 2019-10-16 ENCOUNTER — Encounter: Payer: Self-pay | Admitting: Licensed Clinical Social Worker
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
## 2019-10-16 NOTE — Telephone Encounter (Signed)
Spoke to patient about missed appointment. He stated that he called and canceled the appointment due to dialysis running late that day.

## 2019-10-17 ENCOUNTER — Other Ambulatory Visit: Payer: Self-pay

## 2019-10-17 ENCOUNTER — Ambulatory Visit: Payer: Medicare Other | Admitting: Physical Therapy

## 2019-10-17 ENCOUNTER — Encounter: Payer: Self-pay | Admitting: Physical Therapy

## 2019-10-17 DIAGNOSIS — M6281 Muscle weakness (generalized): Secondary | ICD-10-CM

## 2019-10-17 DIAGNOSIS — R29898 Other symptoms and signs involving the musculoskeletal system: Secondary | ICD-10-CM

## 2019-10-17 DIAGNOSIS — M79609 Pain in unspecified limb: Secondary | ICD-10-CM | POA: Diagnosis present

## 2019-10-17 NOTE — Progress Notes (Signed)
Internal Medicine Clinic Attending  Case discussed with Dr. Helberg at the time of the visit.  We reviewed the resident's history and exam and pertinent patient test results.  I agree with the assessment, diagnosis, and plan of care documented in the resident's note.    
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
## 2019-10-21 ENCOUNTER — Other Ambulatory Visit: Payer: Self-pay | Admitting: *Deleted

## 2019-10-21 MED ORDER — SERTRALINE HCL 50 MG PO TABS: 50.0000 mg | ORAL_TABLET | Freq: Every day | ORAL | 0 refills | Status: DC

## 2019-10-22 ENCOUNTER — Ambulatory Visit: Payer: Medicare Other | Admitting: Physical Therapy

## 2019-10-24 ENCOUNTER — Ambulatory Visit: Payer: Medicare Other | Admitting: Physical Therapy

## 2019-10-24 ENCOUNTER — Other Ambulatory Visit: Payer: Self-pay

## 2019-10-24 ENCOUNTER — Encounter: Payer: Self-pay | Admitting: Physical Therapy

## 2019-10-24 DIAGNOSIS — M6281 Muscle weakness (generalized): Secondary | ICD-10-CM

## 2019-10-24 DIAGNOSIS — R29898 Other symptoms and signs involving the musculoskeletal system: Secondary | ICD-10-CM
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
## 2019-10-29 ENCOUNTER — Encounter: Payer: Self-pay | Admitting: Physical Therapy

## 2019-10-29 ENCOUNTER — Ambulatory Visit: Payer: Medicare Other | Admitting: Physical Therapy

## 2019-10-29 ENCOUNTER — Ambulatory Visit: Payer: No Typology Code available for payment source | Admitting: Licensed Clinical Social Worker

## 2019-10-29 ENCOUNTER — Other Ambulatory Visit: Payer: Self-pay

## 2019-10-29 ENCOUNTER — Other Ambulatory Visit: Payer: Self-pay | Admitting: Internal Medicine

## 2019-10-29 DIAGNOSIS — Z992 Dependence on renal dialysis: Secondary | ICD-10-CM

## 2019-10-29 DIAGNOSIS — M6281 Muscle weakness (generalized): Secondary | ICD-10-CM

## 2019-10-29 DIAGNOSIS — E1122 Type 2 diabetes mellitus with diabetic chronic kidney disease: Secondary | ICD-10-CM

## 2019-10-29 DIAGNOSIS — R29898 Other symptoms and signs involving the musculoskeletal system: Secondary | ICD-10-CM

## 2019-10-29 NOTE — Telephone Encounter (Signed)
Has refills at pharm of ozempic per pharmacist

## 2019-10-29 NOTE — Telephone Encounter (Signed)
Refill Request  Semaglutide,0.25 or 0.5MG /DOS, (OZEMPIC, 0.25 OR 0.5 MG/DOSE,) 2 MG/1.5ML SOPN insulin degludec (TRESIBA FLEXTOUCH) 100 UNIT/ML SOPN FlexTouch Pen  [facility] [id number] - [place], [place] - [address] [id number] - [place], [address]

## 2019-10-29 NOTE — Therapy (Addendum)
[place], [place], [address] Phone: [phone]   Fax:  [phone]  Physical Therapy Treatment  Patient Details  Name: Seth [name] MRN: [id number] Date of Birth: [birth year] Referring Provider (PT): Dr Oval Linsey   Encounter Date: 10/29/2019  PT End of Session - 10/29/19 0952    Visit Number  5    Number of Visits  12    Date for PT Re-Evaluation  11/12/19    Authorization Type  MCR p-note 10th visit    PT Start Time  0932    PT Stop Time  1012    PT Time Calculation (min)  40 min       Past Medical History:  Diagnosis Date  . Acute on chronic respiratory failure with hypoxia and hypercapnia ([place]) 05/29/2019  . Diabetes mellitus    INSULIN DEPENDENT DIABETES  . Dialysis patient ([place])   . 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 ESRD (end stage renal disease) on dialysis [facility])    [facility] M/W/F  . Hypertension   . 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 Sleep apnea    USES CPAP  . Type 2 diabetes mellitus ([place])     Past Surgical History:  Procedure Laterality Date  . A/V FISTULAGRAM N/A 10/18/2018   Procedure: A/V FISTULAGRAM;  Surgeon: Marty Heck, MD;  Location: [place] [facility];  Service: Cardiovascular;  Laterality: N/A;  . A/V FISTULAGRAM Left 03/28/2019   Procedure: A/V Fistulagram;  Surgeon: Marty Heck, MD;  Location: [place] [facility];  Service: Cardiovascular;  Laterality: Left;  . AV FISTULA PLACEMENT  05/03/2012   Procedure: ARTERIOVENOUS (AV) FISTULA CREATION;  Surgeon: Mal [name], MD;  Location: [place];  Service: Vascular;  Laterality: Right;  . AV FISTULA PLACEMENT Left 10/23/2018   Procedure: ARTERIOVENOUS (AV) FISTULA CREATION;  Surgeon: Waynetta Sandy, MD;  Location: [place];  Service: Vascular;  Laterality: Left;  . BASCILIC VEIN TRANSPOSITION Left 04/02/2019   Procedure: BASILIC VEIN  TRANSPOSITION SECOND STAGE LEFT;  Surgeon: Waynetta Sandy, MD;  Location: [place];  Service: Vascular;  Laterality: Left;  . INSERTION OF DIALYSIS CATHETER Left 12/03/2018   Procedure: INSERTION OF DIALYSIS CATHETER;  Surgeon: Marty Heck, MD;  Location: [place];  Service: Vascular;  Laterality: Left;  . INSERTION OF DIALYSIS CATHETER Left 06/03/2019   Procedure: INSERTION OF DIALYSIS CATHETER;  Surgeon: Marty Heck, MD;  Location: [place];  Service: Vascular;  Laterality: Left;  . ORIF MANDIBULAR FRACTURE N/A 11/10/2017   Procedure: OPEN REDUCTION INTERNAL FIXATION (ORIF) MANDIBULAR FRACTURE POSSIBLE MAXILLARY MANDIBULAR FIXATION;  Surgeon: Jodi Marble, MD;  Location: [place];  Service: ENT;  Laterality: N/A;  . REVISON OF ARTERIOVENOUS FISTULA Right 05/31/2018   Procedure: REVISION OF ARTERIOVENOUS FISTULA  RIGHT ARM WITH INTERPOSITION ARTEGRAFT 6MM X 16CM GRAFT;  Surgeon: Waynetta Sandy, MD;  Location: [place];  Service: Vascular;  Laterality: Right;  . TEE WITHOUT CARDIOVERSION N/A 07/10/2015   Procedure: TRANSESOPHAGEAL ECHOCARDIOGRAM (TEE);  Surgeon: Josue Hector, MD;  Location: [place];  Service: Cardiovascular;  Laterality: N/A;  . TEE WITHOUT CARDIOVERSION N/A 12/07/2018   Procedure: TRANSESOPHAGEAL ECHOCARDIOGRAM (TEE);  Surgeon: Acie Fredrickson Wonda Cheng, MD;  Location: [place];  Service: Cardiovascular;  Laterality: N/A;  . TEE WITHOUT CARDIOVERSION N/A 06/03/2019   Procedure: Transesophageal Echocardiogram (Tee);  Surgeon: Thayer Headings, MD;  Location: [place];  Service: Cardiovascular;  Laterality: N/A;  . tracheostomy removal    . TRACHEOSTOMY TUBE PLACEMENT N/A 08/20/2013   Procedure: TRACHEOSTOMY Revision;  Surgeon: Melida Quitter, MD;  Location: [place];  Service: ENT;  Laterality: N/A;  . UPPER EXTREMITY VENOGRAPHY Bilateral 03/28/2019   Procedure: UPPER EXTREMITY VENOGRAPHY;  Surgeon: Marty Heck, MD;  Location: [place] [facility];  Service:  Cardiovascular;  Laterality: Bilateral;    There were no vitals filed for this visit.  Subjective Assessment - 10/29/19 0951    Subjective  Feel like I need a good work out today.    Currently in Pain?  Yes    Pain Score  9     Pain Location  Back   and knees   Pain Orientation  Lower;Right;Left    Pain Descriptors / Indicators  Aching;Throbbing    Pain Type  Chronic pain                       [facility] Adult PT Treatment/Exercise - 10/29/19 0001      Lumbar Exercises: Aerobic   Nustep  L5 x 6 minutes LE only       Lumbar Exercises: Standing   Heel Raises  20 reps    Functional Squats  20 reps    Functional Squats Limitations  at sink     Other Standing Lumbar Exercises  standing hip abduction x 20 each at sinkarching at sink x 20       Lumbar Exercises: Supine   Bent Knee Raise  20 reps    Bent Knee Raise Limitations  with abdominal draw in     Bridge Limitations  gluteal squueze x 20     Other Supine Lumbar Exercises  SAQ 5# each 20 x 2     Other Supine Lumbar Exercises  lower trunk rotations       Knee/Hip Exercises: Seated   Long Arc Quad  20 reps   1 set, 10 sec holds    Long Arc Quad Weight  5 lbs.    Ball Squeeze  x 20     x 2    Clamshell with TheraBand  Blue   20x 2    Marching  20 reps   2 sets   Marching Limitations  5    Sit to [employer]  2 sets;10 reps   airex in chair for elevation, without UE                 PT Long Term Goals - 10/01/19 1402      PT LONG TERM GOAL #1   Title  The patient will be independent in safe self progression of HEP for further strengthening   11/12/19    Time  6    Period  Weeks    Status  New    Target Date  11/12/19      PT LONG TERM GOAL #2   Title  improve lower body strength to allow him to transfer sit to stand without UE support ( 11/12/2019)    Time  6    Period  Weeks    Status  New    Target Date  11/12/19      PT LONG TERM GOAL #3   Title  report =/> 50% reduction in overall pain with  use of home TENs machine and exercise ( 11/12/2019)    Time  6    Period  Weeks    Status  New  Target Date  11/12/19      PT LONG TERM GOAL #4   Title  ambulate with minimal gait devaitions on level surfaces ( 11/12/2019)    Time  6    Period  Weeks    Status  New    Target Date  11/12/19      PT LONG TERM GOAL #5   Title  -------            Plan - 10/29/19 [id number]    Clinical Impression Statement  Pt reports improved mobility with walking and getting out of his recliner. He continues to rate pain at 9/10 in back and knees  He demonstrates improved tolerance to standing therex today. He declined modaliites.    PT Next Visit Plan  core and lower body strengthening, gait, closed chain as tolerated    PT Home Exercise Plan  LAQ, Sit-stand, Hamstring stretch seated, added pelvic tilit, supine marching, seated clam and seated ball squeeze, LTR       Patient will benefit from skilled therapeutic intervention in order to improve the following deficits and impairments:  Abnormal gait, Decreased range of motion, Obesity, Increased muscle spasms, Pain, Decreased strength  Visit Diagnosis: Muscle weakness (generalized)  Other symptoms and signs involving the musculoskeletal system     Problem List Patient Active Problem List   Diagnosis Date Noted  . Cellulitis 10/15/2019  . Furuncle of groin 10/01/2019  . Other spondylosis with radiculopathy, lumbar region 09/25/2019  . Chronic respiratory failure with hypoxia and hypercapnia ([place]) 08/29/2019  . PTSD (post-traumatic stress disorder) 08/29/2019  . Pressure injury of skin 08/23/2019  . Acute kidney injury ([place]) 08/22/2019  . Hypoxia 08/16/2019  . Eczema 08/16/2019  . Chronic, continuous use of opioids 08/16/2019  . Furuncles 04/30/2019  . NSTEMI (non-ST elevated myocardial infarction) ([place]) 11/29/2018  . Hyponatremia 11/29/2018  . Hyperkalemia 09/03/2018  . Nightmares 12/28/2017  . Preventative health care 10/01/2015  .  Primary osteoarthritis of right knee   . Cord compression myelopathy ([place]) 08/17/2015  . Muscle pain, lumbar 08/11/2015  . Controlled type 2 diabetes mellitus with chronic kidney disease on chronic dialysis ([place]) 07/20/2015  . Absolute anemia   . OSA (obstructive sleep apnea) 10/14/2013  . ESRD (end stage renal disease) on dialysis ([place]) 04/30/2012  . Hyperlipidemia associated with type 2 diabetes mellitus ([place]) 06/20/2007  . Obesity hypoventilation syndrome ([place]) 06/20/2007  . Hypertension associated with diabetes ([place]) 06/20/2007    Dorene Ar, PTA 10/29/2019, 10:17 AM  [facility] [address] [place], [place], [address] Phone: [phone]   Fax:  [phone]  Name: Seth [name] MRN: [id number] Date of Birth: 14-Nov-1965

## 2019-10-31 ENCOUNTER — Encounter: Payer: Self-pay | Admitting: Physical Therapy

## 2019-10-31 ENCOUNTER — Other Ambulatory Visit: Payer: Self-pay

## 2019-10-31 ENCOUNTER — Ambulatory Visit: Payer: Medicare Other | Admitting: Physical Therapy

## 2019-10-31 DIAGNOSIS — M79609 Pain in unspecified limb: Secondary | ICD-10-CM

## 2019-10-31 DIAGNOSIS — R29898 Other symptoms and signs involving the musculoskeletal system: Secondary | ICD-10-CM

## 2019-10-31 DIAGNOSIS — M6281 Muscle weakness (generalized): Secondary | ICD-10-CM | POA: Diagnosis not present

## 2019-10-31 NOTE — Therapy (Signed)
[place], [place], [address] Phone: [phone]   Fax:  [phone]  Physical Therapy Treatment  Patient Details  Name: Seth [name] MRN: [id number] Date of Birth: 05-18-1966 Referring Provider (PT): Dr Oval Linsey   Encounter Date: 10/31/2019  PT End of Session - 10/31/19 0935    Visit Number  6    Number of Visits  12    Date for PT Re-Evaluation  11/12/19    Authorization Type  MCR p-note 10th visit    PT Start Time  0930    PT Stop Time  1010    PT Time Calculation (min)  40 min       Past Medical History:  Diagnosis Date  . Acute on chronic respiratory failure with hypoxia and hypercapnia ([place]) 05/29/2019  . Diabetes mellitus    INSULIN DEPENDENT DIABETES  . Dialysis patient ([place])   . 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 ESRD (end stage renal disease) on dialysis [facility])    [facility] M/W/F  . Hypertension   . 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 Sleep apnea    USES CPAP  . Type 2 diabetes mellitus ([place])     Past Surgical History:  Procedure Laterality Date  . A/V FISTULAGRAM N/A 10/18/2018   Procedure: A/V FISTULAGRAM;  Surgeon: Marty Heck, MD;  Location: [place] [facility];  Service: Cardiovascular;  Laterality: N/A;  . A/V FISTULAGRAM Left 03/28/2019   Procedure: A/V Fistulagram;  Surgeon: Marty Heck, MD;  Location: [place] [facility];  Service: Cardiovascular;  Laterality: Left;  . AV FISTULA PLACEMENT  05/03/2012   Procedure: ARTERIOVENOUS (AV) FISTULA CREATION;  Surgeon: Mal [name], MD;  Location: [place];  Service: Vascular;  Laterality: Right;  . AV FISTULA PLACEMENT Left 10/23/2018   Procedure: ARTERIOVENOUS (AV) FISTULA CREATION;  Surgeon: Waynetta Sandy, MD;  Location: [place];  Service: Vascular;  Laterality: Left;  . BASCILIC VEIN TRANSPOSITION Left 04/02/2019   Procedure: BASILIC VEIN  TRANSPOSITION SECOND STAGE LEFT;  Surgeon: Waynetta Sandy, MD;  Location: [place] [place];  Service: Vascular;  Laterality: Left;  . INSERTION OF DIALYSIS CATHETER Left 12/03/2018   Procedure: INSERTION OF DIALYSIS CATHETER;  Surgeon: Marty Heck, MD;  Location: [place];  Service: Vascular;  Laterality: Left;  . INSERTION OF DIALYSIS CATHETER Left 06/03/2019   Procedure: INSERTION OF DIALYSIS CATHETER;  Surgeon: Marty Heck, MD;  Location: [place];  Service: Vascular;  Laterality: Left;  . ORIF MANDIBULAR FRACTURE N/A 11/10/2017   Procedure: OPEN REDUCTION INTERNAL FIXATION (ORIF) MANDIBULAR FRACTURE POSSIBLE MAXILLARY MANDIBULAR FIXATION;  Surgeon: Jodi Marble, MD;  Location: [place];  Service: ENT;  Laterality: N/A;  . REVISON OF ARTERIOVENOUS FISTULA Right 05/31/2018   Procedure: REVISION OF ARTERIOVENOUS FISTULA  RIGHT ARM WITH INTERPOSITION ARTEGRAFT 6MM X 16CM GRAFT;  Surgeon: Waynetta Sandy, MD;  Location: [place];  Service: Vascular;  Laterality: Right;  . TEE WITHOUT CARDIOVERSION N/A 07/10/2015   Procedure: TRANSESOPHAGEAL ECHOCARDIOGRAM (TEE);  Surgeon: Josue Hector, MD;  Location: [place];  Service: Cardiovascular;  Laterality: N/A;  . TEE WITHOUT CARDIOVERSION N/A 12/07/2018   Procedure: TRANSESOPHAGEAL ECHOCARDIOGRAM (TEE);  Surgeon: Acie Fredrickson Wonda Cheng, MD;  Location: [place];  Service: Cardiovascular;  Laterality: N/A;  . TEE WITHOUT CARDIOVERSION N/A 06/03/2019   Procedure: Transesophageal Echocardiogram (Tee);  Surgeon: Thayer Headings, MD;  Location: [place];  Service: Cardiovascular;  Laterality: N/A;  . tracheostomy removal    . TRACHEOSTOMY TUBE PLACEMENT N/A 08/20/2013   Procedure: TRACHEOSTOMY Revision;  Surgeon: Melida Quitter, MD;  Location: [place];  Service: ENT;  Laterality: N/A;  . UPPER EXTREMITY VENOGRAPHY Bilateral 03/28/2019   Procedure: UPPER EXTREMITY VENOGRAPHY;  Surgeon: Marty Heck, MD;  Location: [place] [facility];  Service:  Cardiovascular;  Laterality: Bilateral;    There were no vitals filed for this visit.  Subjective Assessment - 10/31/19 0934    Subjective  I hurt bad when it is raining like this.    Currently in Pain?  Yes    Pain Score  10-Worst pain ever    Pain Location  Back    Pain Orientation  Lower   right and left knees 9/10   Aggravating Factors   weather, walking, standing    Pain Relieving Factors  resting                       [facility] Adult PT Treatment/Exercise - 10/31/19 0001      Lumbar Exercises: Aerobic   Nustep  L6 x 6 minutes LE only       Lumbar Exercises: Standing   Heel Raises  20 reps    Functional Squats  20 reps    Other Standing Lumbar Exercises  standing hip abduction x 20 each at sinkarching at sink x 30    Other Standing Lumbar Exercises  side stepping in parallel bars       Lumbar Exercises: Supine   Other Supine Lumbar Exercises  SAQ 5# each 20 x 2     Other Supine Lumbar Exercises  lower trunk rotations       Knee/Hip Exercises: Seated   Long Arc Quad  --   30 reps, 2 sets each    Long Arc Quad Weight  5 lbs.    Ball Squeeze  x 40    Clamshell with TheraBand  Green   x 100   Marching  --   30 reps , 2 sets each   Marching Limitations  5    Sit to [place]  20 reps   UE use for rise and lower                  PT Long Term Goals - 10/01/19 1402      PT LONG TERM GOAL #1   Title  The patient will be independent in safe self progression of HEP for further strengthening   11/12/19    Time  6    Period  Weeks    Status  New    Target Date  11/12/19      PT LONG TERM GOAL #2   Title  improve lower body strength to allow him to transfer sit to stand without UE support ( 11/12/2019)    Time  6    Period  Weeks    Status  New    Target Date  11/12/19      PT LONG TERM GOAL #3   Title  report =/> 50% reduction in overall pain with use of home TENs machine and exercise ( 11/12/2019)    Time  6    Period  Weeks    Status  New     Target Date  11/12/19      PT LONG TERM GOAL #4   Title  ambulate with minimal gait devaitions on level surfaces ( 11/12/2019)  Time  6    Period  Weeks    Status  New    Target Date  11/12/19      PT LONG TERM GOAL #5   Title  -------            Plan - 10/31/19 1016    Clinical Impression Statement  Pt works hard despite high levels of pain. Able to increased reps and sets today on strengthening.    PT Next Visit Plan  core and lower body strengthening, gait, closed chain as tolerated    PT Home Exercise Plan  LAQ, Sit-stand, Hamstring stretch seated, added pelvic tilit, supine marching, seated clam and seated ball squeeze, LTR       Patient will benefit from skilled therapeutic intervention in order to improve the following deficits and impairments:  Abnormal gait, Decreased range of motion, Obesity, Increased muscle spasms, Pain, Decreased strength  Visit Diagnosis: Muscle weakness (generalized)  Other symptoms and signs involving the musculoskeletal system  Pain in extremity, unspecified extremity     Problem List Patient Active Problem List   Diagnosis Date Noted  . Cellulitis 10/15/2019  . Furuncle of groin 10/01/2019  . Other spondylosis with radiculopathy, lumbar region 09/25/2019  . Chronic respiratory failure with hypoxia and hypercapnia ([place]) 08/29/2019  . PTSD (post-traumatic stress disorder) 08/29/2019  . Pressure injury of skin 08/23/2019  . Acute kidney injury ([place]) 08/22/2019  . Hypoxia 08/16/2019  . Eczema 08/16/2019  . Chronic, continuous use of opioids 08/16/2019  . Furuncles 04/30/2019  . NSTEMI (non-ST elevated myocardial infarction) ([place]) 11/29/2018  . Hyponatremia 11/29/2018  . Hyperkalemia 09/03/2018  . Nightmares 12/28/2017  . Preventative health care 10/01/2015  . Primary osteoarthritis of right knee   . Cord compression myelopathy ([place]) 08/17/2015  . Muscle pain, lumbar 08/11/2015  . Controlled type 2 diabetes mellitus with  chronic kidney disease on chronic dialysis ([place]) 07/20/2015  . Absolute anemia   . OSA (obstructive sleep apnea) 10/14/2013  . ESRD (end stage renal disease) on dialysis ([place]) 04/30/2012  . Hyperlipidemia associated with type 2 diabetes mellitus ([place]) 06/20/2007  . Obesity hypoventilation syndrome ([place]) 06/20/2007  . Hypertension associated with diabetes ([place]) 06/20/2007    Dorene Ar, PTA 10/31/2019, 10:18 AM  [facility] [address] [place], [place], [address] Phone: [phone]   Fax:  [phone]  Name: Seth [name] MRN: [id number] Date of Birth: [birth year]

## 2019-11-01 MED ORDER — TRESIBA FLEXTOUCH 100 UNIT/ML ~~LOC~~ SOPN: 14.0000 [IU] | PEN_INJECTOR | Freq: Every day | SUBCUTANEOUS | 3 refills | Status: AC

## 2019-11-01 NOTE — Telephone Encounter (Signed)
Pt is calling back regarding medicine, he's been out for 2 days; pt contact [phone]

## 2019-11-01 NOTE — Telephone Encounter (Signed)
Patient notified refill for Seth [name] has been sent and pharmacy has refill on file for ozempic. He was appreciative. Hubbard Hartshorn, BSN, RN-BC

## 2019-11-06 ENCOUNTER — Encounter: Payer: Self-pay | Admitting: Physical Medicine & Rehabilitation

## 2019-11-06 ENCOUNTER — Other Ambulatory Visit: Payer: Self-pay

## 2019-11-06 ENCOUNTER — Encounter: Payer: Medicare Other | Attending: Physical Medicine & Rehabilitation | Admitting: Physical Medicine & Rehabilitation

## 2019-11-06 VITALS — BP 148/73 | HR 85 | Temp 97.4°F | Ht 67.0 in | Wt 273.0 lb

## 2019-11-06 DIAGNOSIS — M4726 Other spondylosis with radiculopathy, lumbar region: Secondary | ICD-10-CM | POA: Diagnosis not present

## 2019-11-06 DIAGNOSIS — G9520 Unspecified cord compression: Secondary | ICD-10-CM | POA: Insufficient documentation

## 2019-11-06 DIAGNOSIS — M1711 Unilateral primary osteoarthritis, right knee: Secondary | ICD-10-CM | POA: Diagnosis not present

## 2019-11-06 MED ORDER — TRAMADOL HCL 50 MG PO TABS: 50.0000 mg | ORAL_TABLET | Freq: Two times a day (BID) | ORAL | 0 refills | Status: AC

## 2019-11-06 NOTE — Progress Notes (Signed)
Subjective:    [name], male    DOB: 1966-01-20, 54 y.o.   MRN: [id number]  HPI   Due to national recommendations of social distancing because of COVID 83, an audio/video tele-health visit is felt to be the most appropriate encounter for this patient at this time. See MyChart message from today for the patient's consent to a tele-health encounter with [facility]. This is a follow up telephone visit for the patient who is at home. MD is at office.    Seth [name] is visiting with me in regards to chronic pain.  I last saw him in December.  We sent him to physical therapy which has helped improve his ambulation and gait quality.  He is now walking without a cane.  He states that he is pushing through his pain however.  He had about 2 weeks of relief with the right knee injection.  He forgot to go get the x-ray however.  He still having a lot of low back pain with radiation into the right leg which overlaps with the knee pain apparently.  Back when he saw [place] 3 years ago we had him on oxycodone for pain control which helped keep things under control.  He ended up testing positive for marijuana and we made him nonnarcotic.  He tells me that he is never smoked marijuana and that the girl he was dating at the time did and he feels that he got secondhand smoke from her.  He has continued to work aggressively on his weight loss and is down to 276 pounds currently.  He has noticed an improvement in his pain especially in the right leg with the weight loss.  He remains on hemodialysis for his end-stage renal disease.  Pain Inventory Average Pain 9 Pain Right Now 10 My pain is sharp, burning, dull, tingling and aching  In the last 24 hours, has pain interfered with the following? General activity 6 Relation with others 6 Enjoyment of life 6 What TIME of day is your pain at its worst? varies Sleep (in general) Fair  Pain is worse with: walking and  some activites Pain improves with: medication Relief from Meds: 4  Mobility walk without assistance ability to climb steps?  yes do you drive?  yes  Function retired  Neuro/Psych trouble walking spasms  Prior Studies Any changes since last visit?  no  Physicians involved in your care Any changes since last visit?  no   Family History  Problem Relation Age of Onset  . Diabetes Mother   . Cancer Mother   . Diabetes Sister   . Diabetes Brother    Social History   Socioeconomic History  . Marital status: Single    Spouse name: Not on file  . Number of children: Not on file  . Years of education: Not on file  . Highest education level: Not on file  Occupational History  . Not on file  Tobacco Use  . Smoking status: Never Smoker  . Smokeless tobacco: Never Used  Substance and Sexual Activity  . Alcohol use: No  . Drug use: Not Currently    Types: Marijuana  . Sexual activity: Not Currently    Birth control/protection: None  Other Topics Concern  . 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  Divorce with 5 children, 3 boys and 2 girls.  Lives in [place].    Social Determinants of Health   Financial Resource Strain: High Risk  . Difficulty of Paying Living Expenses: Hard  Food Insecurity: No Food Insecurity  . Worried About Charity fundraiser in the Last Year: Never true  . Ran Out of Food in the Last Year: Never true  Transportation Needs: No Transportation Needs  . Lack of Transportation (Medical): No  . Lack of Transportation (Non-Medical): No  Physical Activity: Inactive  . Days of Exercise per Week: 0 days  . Minutes of Exercise per Session: 0 min  Stress: No Stress Concern Present  . Feeling of Stress : Only a little  Social Connections:  Somewhat Isolated  . Frequency of Communication with Friends and Family: More than three times a week  . Frequency of Social Gatherings with Friends and Family: Once a week  . Attends Religious Services: 1 to 4 times per year  . Active Member of Clubs or Organizations: No  . Attends Archivist Meetings: Never  . Marital Status: Divorced   Past Surgical History:  Procedure Laterality Date  . A/V FISTULAGRAM N/A 10/18/2018   Procedure: A/V FISTULAGRAM;  Surgeon: Marty Heck, MD;  Location: [facility];  Service: Cardiovascular;  Laterality: N/A;  . A/V FISTULAGRAM Left 03/28/2019   Procedure: A/V Fistulagram;  Surgeon: Marty Heck, MD;  Location: [facility];  Service: Cardiovascular;  Laterality: Left;  . AV FISTULA PLACEMENT  05/03/2012   Procedure: ARTERIOVENOUS (AV) FISTULA CREATION;  Surgeon: Mal [name], MD;  Location: [facility];  Service: Vascular;  Laterality: Right;  . AV FISTULA PLACEMENT Left 10/23/2018   Procedure: ARTERIOVENOUS (AV) FISTULA CREATION;  Surgeon: Waynetta Sandy, MD;  Location: [facility];  Service: Vascular;  Laterality: Left;  . BASCILIC VEIN TRANSPOSITION Left 04/02/2019   Procedure: BASILIC VEIN TRANSPOSITION SECOND STAGE LEFT;  Surgeon: Waynetta Sandy, MD;  Location: [facility];  Service: Vascular;  Laterality: Left;  . INSERTION OF DIALYSIS CATHETER Left 12/03/2018   Procedure: INSERTION OF DIALYSIS CATHETER;  Surgeon: Marty Heck, MD;  Location: [facility];  Service: Vascular;  Laterality: Left;  . INSERTION OF DIALYSIS CATHETER Left 06/03/2019   Procedure: INSERTION OF DIALYSIS CATHETER;  Surgeon: Marty Heck, MD;  Location: [facility];  Service: Vascular;  Laterality: Left;  . ORIF MANDIBULAR FRACTURE N/A 11/10/2017   Procedure: OPEN REDUCTION INTERNAL FIXATION (ORIF) MANDIBULAR FRACTURE POSSIBLE MAXILLARY MANDIBULAR FIXATION;  Surgeon: Jodi Marble, MD;  Location: [facility];  Service: ENT;  Laterality: N/A;   . REVISON OF ARTERIOVENOUS FISTULA Right 05/31/2018   Procedure: REVISION OF ARTERIOVENOUS FISTULA  RIGHT ARM WITH INTERPOSITION ARTEGRAFT 6MM X 16CM GRAFT;  Surgeon: Waynetta Sandy, MD;  Location: [facility];  Service: Vascular;  Laterality: Right;  . TEE WITHOUT CARDIOVERSION N/A 07/10/2015   Procedure: TRANSESOPHAGEAL ECHOCARDIOGRAM (TEE);  Surgeon: Josue Hector, MD;  Location: [facility];  Service: Cardiovascular;  Laterality: N/A;  . TEE WITHOUT CARDIOVERSION N/A 12/07/2018   Procedure: TRANSESOPHAGEAL ECHOCARDIOGRAM (TEE);  Surgeon: Acie Fredrickson Wonda Cheng, MD;  Location: [facility];  Service: Cardiovascular;  Laterality: N/A;  . TEE WITHOUT CARDIOVERSION N/A 06/03/2019   Procedure: Transesophageal Echocardiogram (Tee);  Surgeon: Acie Fredrickson Wonda Cheng, MD;  [facility];  Service: Cardiovascular;  Laterality: N/A;  . tracheostomy removal    .  TRACHEOSTOMY TUBE PLACEMENT N/A 08/20/2013   Procedure: TRACHEOSTOMY Revision;  Surgeon: Melida Quitter, MD;  Location: [facility];  Service: ENT;  Laterality: N/A;  . UPPER EXTREMITY VENOGRAPHY Bilateral 03/28/2019   Procedure: UPPER EXTREMITY VENOGRAPHY;  Surgeon: Marty Heck, MD;  Location: [facility];  Service: Cardiovascular;  Laterality: Bilateral;   Past Medical History:  Diagnosis Date  . Acute on chronic respiratory failure with hypoxia and hypercapnia ([place]) 05/29/2019  . Diabetes mellitus    INSULIN DEPENDENT DIABETES  . Dialysis patient ([place])   . 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 ESRD (end stage renal disease) on dialysis [facility])    [facility] M/W/F  . Hypertension   . 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 Sleep apnea    USES CPAP  . Type 2 diabetes mellitus ([name])    BP (!) 148/73 Comment: pt reported, virtual visit  Pulse 85 Comment: pt reported, virtual visit  Temp (!) 97.4 F (36.3 C) Comment: pt reported, virtual visit  Ht 5\' 7"  (1.702 m) Comment: pt reported,  virtual visit  Wt 273 lb (123.8 kg) Comment: pt reported, virtual visit  SpO2 98% Comment: pt reported, virtual visit  BMI 42.76 kg/m   Opioid Risk Score:   Fall Risk Score:  `1  Depression screen PHQ 2/9  Depression screen [facility] 2/9 10/01/2019 09/17/2019 08/15/2019 06/24/2019 06/11/2019 04/29/2019 11/20/2018  Decreased Interest 1 0 1 0 0 0 1  Down, Depressed, Hopeless 0 0 1 0 0 0 1  PHQ - 2 Score 1 0 2 0 0 0 2  Altered sleeping 0 - 1 - - 0 1  Tired, decreased energy 0 - 1 - - 0 0  Change in appetite 0 - 1 - - 0 0  Feeling bad or failure about yourself  0 - 1 - - 0 0  Trouble concentrating 0 - 1 - - 0 1  Moving slowly or fidgety/restless 0 - 1 - 0 0 0  Suicidal thoughts 0 - 1 - - - 0  PHQ-9 Score 1 - 9 - - 0 4  Difficult doing work/chores Not difficult at all Not difficult at all Somewhat difficult - Not difficult at all Not difficult at all Somewhat difficult  Some recent data might be hidden    Review of Systems  Musculoskeletal: Positive for back pain and gait problem.  All other systems reviewed and are negative.      Objective:   Physical Exam  n/a      Assessment & Plan:  1. 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 Right knee pain/OA        -still needs xrays of right knee        -weight loss is important. He's doing a good job with tat.  -ice to knee        -knee strengthening exercises 3. ESRD:HD per nephrology  4. Lumbar stenosis with radiculopathy as above         -continue with HEP as outlined by therapy.       -will set up for right L5-S1 transforaminal ESI per Dr. Letta Pate      -will write for #60 50mg  tramadol. May use q12 hours      -to receive anything stronger he will need to have a drug swab. He understands that there is a  short leash here.   12 minutes of tele-visit time was spent with this patient  today. Follow up in 3-4 weeks with Danella Sensing and as possible with Dr. Letta Pate

## 2019-11-12 ENCOUNTER — Other Ambulatory Visit: Payer: Self-pay

## 2019-11-12 ENCOUNTER — Encounter: Payer: Self-pay | Admitting: Physical Therapy

## 2019-11-12 ENCOUNTER — Ambulatory Visit: Payer: Medicare Other | Attending: Physical Medicine & Rehabilitation | Admitting: Physical Therapy

## 2019-11-12 DIAGNOSIS — M6281 Muscle weakness (generalized): Secondary | ICD-10-CM | POA: Insufficient documentation

## 2019-11-12 DIAGNOSIS — R29898 Other symptoms and signs involving the musculoskeletal system: Secondary | ICD-10-CM | POA: Diagnosis present

## 2019-11-12 DIAGNOSIS — M79609 Pain in unspecified limb: Secondary | ICD-10-CM | POA: Insufficient documentation

## 2019-11-12 NOTE — Therapy (Addendum)
[place], [place], [address] Phone: [phone]   Fax:  [phone]  Physical Therapy Treatment/discharge  Patient Details  Name: Seth [name] MRN: [id number] Date of Birth: 06/14/1966 Referring Provider (PT): Dr Oval Linsey   Encounter Date: 11/12/2019  PT End of Session - 11/12/19 1001    Visit Number  7    Number of Visits  12    Date for PT Re-Evaluation  11/12/19    Authorization Type  MCR p-note 10th visit    PT Start Time  0934    PT Stop Time  1015    PT Time Calculation (min)  41 min       Past Medical History:  Diagnosis Date  . Acute on chronic respiratory failure with hypoxia and hypercapnia ([place]) 05/29/2019  . Diabetes mellitus    INSULIN DEPENDENT DIABETES  . Dialysis patient ([place])   . 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 ESRD (end stage renal disease) on dialysis [facility])    [facility] M/W/F  . Hypertension   . 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 Sleep apnea    USES CPAP  . Type 2 diabetes mellitus ([place])     Past Surgical History:  Procedure Laterality Date  . A/V FISTULAGRAM N/A 10/18/2018   Procedure: A/V FISTULAGRAM;  Surgeon: Marty Heck, MD;  Location: [facility];  Service: Cardiovascular;  Laterality: N/A;  . A/V FISTULAGRAM Left 03/28/2019   Procedure: A/V Fistulagram;  Surgeon: Marty Heck, MD;  Location: [facility];  Service: Cardiovascular;  Laterality: Left;  . AV FISTULA PLACEMENT  05/03/2012   Procedure: ARTERIOVENOUS (AV) FISTULA CREATION;  Surgeon: Mal [name], MD;  Location: [facility];  Service: Vascular;  Laterality: Right;  . AV FISTULA PLACEMENT Left 10/23/2018   Procedure: ARTERIOVENOUS (AV) FISTULA CREATION;  Surgeon: Waynetta Sandy, MD;  Location: [facility];  Service: Vascular;  Laterality: Left;  . BASCILIC VEIN TRANSPOSITION Left 04/02/2019   Procedure: BASILIC VEIN  TRANSPOSITION SECOND STAGE LEFT;  Surgeon: Waynetta Sandy, MD;  Location: [facility];  Service: Vascular;  Laterality: Left;  . INSERTION OF DIALYSIS CATHETER Left 12/03/2018   Procedure: INSERTION OF DIALYSIS CATHETER;  Surgeon: Marty Heck, MD;  Location: [facility];  Service: Vascular;  Laterality: Left;  . INSERTION OF DIALYSIS CATHETER Left 06/03/2019   Procedure: INSERTION OF DIALYSIS CATHETER;  Surgeon: Marty Heck, MD;  Location: [facility];  Service: Vascular;  Laterality: Left;  . ORIF MANDIBULAR FRACTURE N/A 11/10/2017   Procedure: OPEN REDUCTION INTERNAL FIXATION (ORIF) MANDIBULAR FRACTURE POSSIBLE MAXILLARY MANDIBULAR FIXATION;  Surgeon: Jodi Marble, MD;  Location: [facility];  Service: ENT;  Laterality: N/A;  . REVISON OF ARTERIOVENOUS FISTULA Right 05/31/2018   Procedure: REVISION OF ARTERIOVENOUS FISTULA  RIGHT ARM WITH INTERPOSITION ARTEGRAFT 6MM X 16CM GRAFT;  Surgeon: Waynetta Sandy, MD;  Location: [facility];  Service: Vascular;  Laterality: Right;  . TEE WITHOUT CARDIOVERSION N/A 07/10/2015   Procedure: TRANSESOPHAGEAL ECHOCARDIOGRAM (TEE);  Surgeon: Josue Hector, MD;  Location: [facility];  Service: Cardiovascular;  Laterality: N/A;  . TEE WITHOUT CARDIOVERSION N/A 12/07/2018   Procedure: TRANSESOPHAGEAL ECHOCARDIOGRAM (TEE);  Surgeon: Acie Fredrickson Wonda Cheng, MD;  Location: [facility];  Service: Cardiovascular;  Laterality: N/A;  . TEE WITHOUT CARDIOVERSION N/A 06/03/2019   Procedure: Transesophageal Echocardiogram (Tee);  Surgeon: [place] Headings, MD;  Location: [facility];  Service: Cardiovascular;  Laterality: N/A;  . tracheostomy removal    . TRACHEOSTOMY TUBE PLACEMENT N/A 08/20/2013   Procedure: TRACHEOSTOMY Revision;  Surgeon: Melida Quitter, MD;  Location: [facility];  Service: ENT;  Laterality: N/A;  . UPPER EXTREMITY VENOGRAPHY Bilateral 03/28/2019   Procedure: UPPER EXTREMITY VENOGRAPHY;  Surgeon: Marty Heck, MD;  Location: [facility];  Service:  Cardiovascular;  Laterality: Bilateral;    There were no vitals filed for this visit.  Subjective Assessment - 11/12/19 1000    Subjective  I stopped using the cane. 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    Currently in Pain?  Yes    Pain Location  Back    Pain Orientation  Lower    Pain Descriptors / Indicators  Aching;Throbbing    Pain Radiating Towards  right knee    Aggravating Factors   weather, walking standing    Pain Relieving Factors  resting.                       [facility] Adult PT Treatment/Exercise - 11/12/19 0001      Lumbar Exercises: Aerobic   Nustep  L6 x 6 minutes LE only       Lumbar Exercises: Standing   Other Standing Lumbar Exercises  stanidng hip abduction and flexion in parallel bars 50reps each (fast, despite cues to slow)     Other Standing Lumbar Exercises  on Airex: narraow stance eyes closed, head turns, trunk rotations, staggered on airex with static balance- cues for abdominal draw in to reduce LBP      Knee/Hip Exercises: Seated   Long Arc Quad  20 reps    Long Arc Quad Weight  5 lbs.    Marching  --   30 reps , 2 sets each   Marching Limitations  5    Sit to Sand  20 reps   UE use for rise and lower due to right knee pain                 PT Long Term Goals - 11/12/19 1011      PT LONG TERM GOAL #1   Title  The patient will be independent in safe self progression of HEP for further strengthening   11/12/19    Baseline  Independent with current HEP    Time  6    Period  Weeks    Status  Partially Met      PT LONG TERM GOAL #2   Title  improve lower body strength to allow him to transfer sit to stand without UE support ( 11/12/2019)    Baseline  currently needs UE to raise and lower to right knee pain    Time  6    Period  Weeks    Status  On-going      PT LONG TERM GOAL #3   Title  report =/> 50% reduction in overall pain with use of home TENs machine and exercise ( 11/12/2019)    Baseline  no change in pain level, sometimes pain is  not as bad when resting 6/10    Time  6    Period  Weeks    Status  On-going      PT LONG TERM GOAL #4   Title  ambulate with minimal gait devaitions on level surfaces ( 11/12/2019)    Baseline  pt arrives without SPC and min gait deviations on 11/12/2019    Time  6  Period  Weeks    Status  Partially Met            Plan - 11/12/19 1005    Clinical Impression Statement  Pt reports continued compliance with HEP and his other exercises for weight loss, 2 x per day. He arrives without cane ans reports he decided not to use it anymore. He contiues to rate high levels of pain and must use UE for sit-stand exercises due to right knee pain. Incorporated standing balance on unlevel surface with cues for core bracing. He c/o increased back pain with standing therex and required 2 seated rest breaks. He is progressing toward his gait and strength goals.    PT Next Visit Plan  core and lower body strengthening, gait, closed chain as tolerated    PT Home Exercise Plan  LAQ, Sit-stand, Hamstring stretch seated, added pelvic tilit, supine marching, seated clam and seated ball squeeze, LTR       Patient will benefit from skilled therapeutic intervention in order to improve the following deficits and impairments:  Abnormal gait, Decreased range of motion, Obesity, Increased muscle spasms, Pain, Decreased strength  Visit Diagnosis: Muscle weakness (generalized)  Other symptoms and signs involving the musculoskeletal system  Pain in extremity, unspecified extremity     Problem List Patient Active Problem List   Diagnosis Date Noted  . Cellulitis 10/15/2019  . Furuncle of groin 10/01/2019  . Other spondylosis with radiculopathy, lumbar region 09/25/2019  . Chronic respiratory failure with hypoxia and hypercapnia ([place]) 08/29/2019  . PTSD (post-traumatic stress disorder) 08/29/2019  . Pressure injury of skin 08/23/2019  . Acute kidney injury ([place]) 08/22/2019  . Hypoxia 08/16/2019  . Eczema  08/16/2019  . Chronic, continuous use of opioids 08/16/2019  . Furuncles 04/30/2019  . NSTEMI (non-ST elevated myocardial infarction) ([place]) 11/29/2018  . Hyponatremia 11/29/2018  . Hyperkalemia 09/03/2018  . Nightmares 12/28/2017  . Preventative health care 10/01/2015  . Primary osteoarthritis of right knee   . 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 Muscle pain, lumbar 08/11/2015  . Controlled type 2 diabetes mellitus with chronic kidney disease on chronic dialysis ([place]) 07/20/2015  . Absolute anemia   . OSA (obstructive sleep apnea) 10/14/2013  . ESRD (end stage renal disease) on dialysis ([place]) 04/30/2012  . Hyperlipidemia associated with type 2 diabetes mellitus ([place]) 06/20/2007  . Obesity hypoventilation syndrome ([place]) 06/20/2007  . Hypertension associated with diabetes ([place]) 06/20/2007    Dorene Ar, PTA 11/12/2019, 10:22 AM  [facility] [address] [place], [place], [address] Phone: [phone]   Fax:  [phone]  Name: Seth [name] MRN: [id number] Date of Birth: 1965/12/15   PHYSICAL THERAPY DISCHARGE SUMMARY  Visits from Start of Care: 7  Current functional level related to goals / functional outcomes: Per note today pt is deceased  Remaining deficits:   Education / Equipment: HEP Plan:                                                    Patient goals were partially met. Patient is being discharged due to not returning since the last visit.  ?????    Jeral Pinch, PT 01/08/20 10:35 AM

## 2019-11-26 ENCOUNTER — Other Ambulatory Visit: Payer: Self-pay | Admitting: *Deleted

## 2019-11-26 MED ORDER — ATORVASTATIN CALCIUM 40 MG PO TABS: 40.0000 mg | ORAL_TABLET | Freq: Every day | ORAL | 3 refills | Status: AC

## 2019-11-26 MED ORDER — SERTRALINE HCL 50 MG PO TABS: 50.0000 mg | ORAL_TABLET | Freq: Every day | ORAL | 0 refills | Status: AC

## 2019-12-14 DIAGNOSIS — R0902 Hypoxemia: Secondary | ICD-10-CM | POA: Diagnosis not present

## 2019-12-14 DIAGNOSIS — N186 End stage renal disease: Secondary | ICD-10-CM | POA: Diagnosis not present

## 2019-12-18 ENCOUNTER — Other Ambulatory Visit: Payer: Self-pay | Admitting: Physical Medicine & Rehabilitation

## 2019-12-18 DIAGNOSIS — G9520 Unspecified cord compression: Secondary | ICD-10-CM

## 2019-12-18 DIAGNOSIS — M1711 Unilateral primary osteoarthritis, right knee: Secondary | ICD-10-CM

## 2019-12-18 DIAGNOSIS — M4726 Other spondylosis with radiculopathy, lumbar region: Secondary | ICD-10-CM

## 2019-12-22 ENCOUNTER — Emergency Department (HOSPITAL_COMMUNITY)
Admission: EM | Admit: 2019-12-22 | Discharge: 2019-12-23 | Disposition: A | Discharge status: Home / Self Care | Payer: Medicare Other | Attending: Emergency Medicine | Admitting: Emergency Medicine

## 2019-12-22 ENCOUNTER — Other Ambulatory Visit: Payer: Self-pay

## 2019-12-22 ENCOUNTER — Emergency Department (HOSPITAL_COMMUNITY): Payer: Medicare Other

## 2019-12-22 ENCOUNTER — Encounter (HOSPITAL_COMMUNITY): Payer: Self-pay

## 2019-12-22 DIAGNOSIS — T887XXA Unspecified adverse effect of drug or medicament, initial encounter: Secondary | ICD-10-CM | POA: Insufficient documentation

## 2019-12-22 DIAGNOSIS — R509 Fever, unspecified: Secondary | ICD-10-CM | POA: Diagnosis not present

## 2019-12-22 DIAGNOSIS — R5383 Other fatigue: Secondary | ICD-10-CM | POA: Diagnosis present

## 2019-12-22 DIAGNOSIS — Z79899 Other long term (current) drug therapy: Secondary | ICD-10-CM | POA: Diagnosis not present

## 2019-12-22 DIAGNOSIS — E875 Hyperkalemia: Secondary | ICD-10-CM

## 2019-12-22 DIAGNOSIS — Z794 Long term (current) use of insulin: Secondary | ICD-10-CM | POA: Insufficient documentation

## 2019-12-22 DIAGNOSIS — N186 End stage renal disease: Secondary | ICD-10-CM | POA: Diagnosis not present

## 2019-12-22 DIAGNOSIS — T50Z95A Adverse effect of other vaccines and biological substances, initial encounter: Secondary | ICD-10-CM | POA: Diagnosis not present

## 2019-12-22 DIAGNOSIS — R05 Cough: Secondary | ICD-10-CM | POA: Insufficient documentation

## 2019-12-22 DIAGNOSIS — R000 Tachycardia, unspecified: Secondary | ICD-10-CM | POA: Diagnosis not present

## 2019-12-22 DIAGNOSIS — Z20822 Contact with and (suspected) exposure to covid-19: Secondary | ICD-10-CM | POA: Insufficient documentation

## 2019-12-22 DIAGNOSIS — Z992 Dependence on renal dialysis: Secondary | ICD-10-CM | POA: Insufficient documentation

## 2019-12-22 DIAGNOSIS — Z7982 Long term (current) use of aspirin: Secondary | ICD-10-CM | POA: Insufficient documentation

## 2019-12-22 DIAGNOSIS — E1122 Type 2 diabetes mellitus with diabetic chronic kidney disease: Secondary | ICD-10-CM | POA: Insufficient documentation

## 2019-12-22 DIAGNOSIS — I120 Hypertensive chronic kidney disease with stage 5 chronic kidney disease or end stage renal disease: Secondary | ICD-10-CM | POA: Insufficient documentation

## 2019-12-22 DIAGNOSIS — I252 Old myocardial infarction: Secondary | ICD-10-CM | POA: Insufficient documentation

## 2019-12-22 DIAGNOSIS — R0989 Other specified symptoms and signs involving the circulatory and respiratory systems: Secondary | ICD-10-CM | POA: Diagnosis not present

## 2019-12-22 LAB — CBC WITH DIFFERENTIAL/PLATELET
Abs Immature Granulocytes: 0.14 10*3/uL — ABNORMAL HIGH (ref 0.00–0.07)
Basophils Absolute: 0 10*3/uL (ref 0.0–0.1)
Basophils Relative: 0 %
Eosinophils Absolute: 0.1 10*3/uL (ref 0.0–0.5)
Eosinophils Relative: 0 %
HCT: 38.6 % — ABNORMAL LOW (ref 39.0–52.0)
Hemoglobin: 12.2 g/dL — ABNORMAL LOW (ref 13.0–17.0)
Immature Granulocytes: 1 %
Lymphocytes Relative: 3 %
Lymphs Abs: 0.4 10*3/uL — ABNORMAL LOW (ref 0.7–4.0)
MCH: 29.7 pg (ref 26.0–34.0)
MCHC: 31.6 g/dL (ref 30.0–36.0)
MCV: 93.9 fL (ref 80.0–100.0)
Monocytes Absolute: 0.5 10*3/uL (ref 0.1–1.0)
Monocytes Relative: 3 %
Neutro Abs: 14.5 10*3/uL — ABNORMAL HIGH (ref 1.7–7.7)
Neutrophils Relative %: 93 %
Platelets: 270 10*3/uL (ref 150–400)
RBC: 4.11 MIL/uL — ABNORMAL LOW (ref 4.22–5.81)
RDW: 14.9 % (ref 11.5–15.5)
WBC: 15.7 10*3/uL — ABNORMAL HIGH (ref 4.0–10.5)
nRBC: 0 % (ref 0.0–0.2)

## 2019-12-22 LAB — COMPREHENSIVE METABOLIC PANEL
ALT: 12 U/L (ref 0–44)
AST: 15 U/L (ref 15–41)
Albumin: 3 g/dL — ABNORMAL LOW (ref 3.5–5.0)
Alkaline Phosphatase: 150 U/L — ABNORMAL HIGH (ref 38–126)
Anion gap: 19 — ABNORMAL HIGH (ref 5–15)
BUN: 62 mg/dL — ABNORMAL HIGH (ref 6–20)
CO2: 22 mmol/L (ref 22–32)
Calcium: 8.6 mg/dL — ABNORMAL LOW (ref 8.9–10.3)
Chloride: 84 mmol/L — ABNORMAL LOW (ref 98–111)
Creatinine, Ser: 12 mg/dL — ABNORMAL HIGH (ref 0.61–1.24)
GFR calc Af Amer: 5 mL/min — ABNORMAL LOW (ref >60)
GFR calc non Af Amer: 4 mL/min — ABNORMAL LOW (ref >60)
Glucose, Bld: 117 mg/dL — ABNORMAL HIGH (ref 70–99)
Potassium: 6.1 mmol/L — ABNORMAL HIGH (ref 3.5–5.1)
Sodium: 125 mmol/L — ABNORMAL LOW (ref 135–145)
Total Bilirubin: 1 mg/dL (ref 0.3–1.2)
Total Protein: 8.2 g/dL — ABNORMAL HIGH (ref 6.5–8.1)

## 2019-12-22 LAB — POC SARS CORONAVIRUS 2 AG -  ED: SARS Coronavirus 2 Ag: NEGATIVE

## 2019-12-22 LAB — LACTIC ACID, PLASMA: Lactic Acid, Venous: 1.6 mmol/L (ref 0.5–1.9)

## 2019-12-22 MED ORDER — SODIUM CHLORIDE 0.9 % IV BOLUS: 500.0000 mL | Freq: Once | INTRAVENOUS | Status: DC

## 2019-12-22 MED ORDER — SODIUM ZIRCONIUM CYCLOSILICATE 10 G PO PACK
10.0000 g | PACK | Freq: Once | ORAL | Status: AC
  Administered 2019-12-22: 10 g via ORAL
  Filled 2019-12-22: qty 1

## 2019-12-22 NOTE — Discharge Instructions (Addendum)
Please go to dialysis as scheduled for tomorrow. Your potassium was slightly high today however we have given you medication in the ED that helps bring it down. Dialysis will help normalize it as well.   We have swabbed you for COVID 19 given your symptoms. Please stay at home and self isolate until you receive your results. Your symptoms may very well be related to the vaccine you received on Monday. Continue taking Tylenol as needed for fevers.   Follow up with your PCP regarding your ED visit today.

## 2019-12-22 NOTE — ED Triage Notes (Addendum)
Pt arrives to ED w/ c/o fever. States he took covid vaccine on Monday has not felt well since. Pt afebrile on arrival to ED but states he took iburpofin earlier today. Pt MWF dialysis pt, states he received full dialysis tx on Friday.
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
## 2019-12-22 NOTE — ED Notes (Signed)
PA Alroy Bailiff informed POC covid test neg

## 2019-12-22 NOTE — ED Notes (Signed)
Pt refusing PIV. Pt states "I don't want one of them and I don't need one of them." PA notified

## 2019-12-23 LAB — SARS CORONAVIRUS 2 (TAT 6-24 HRS): SARS Coronavirus 2: NEGATIVE

## 2019-12-23 NOTE — ED Notes (Signed)
cofid swab collected, labeled with 2 pt identifiers, and brought to lab

## 2019-12-23 NOTE — ED Notes (Signed)
Patient verbalizes understanding of discharge instructions and follow up care. Opportunity for questioning and answers were provided. All questions answered completely.  Armband removed by staff, pt discharged from ED. Wheeled from ED and picked up by wife

## 2019-12-24 ENCOUNTER — Ambulatory Visit: Payer: Medicare Other | Admitting: Licensed Clinical Social Worker

## 2019-12-24 ENCOUNTER — Other Ambulatory Visit: Payer: Self-pay

## 2019-12-24 ENCOUNTER — Encounter: Payer: Self-pay | Admitting: Internal Medicine

## 2019-12-24 ENCOUNTER — Ambulatory Visit (INDEPENDENT_AMBULATORY_CARE_PROVIDER_SITE_OTHER): Payer: Medicare Other | Admitting: Internal Medicine

## 2019-12-24 DIAGNOSIS — R05 Cough: Secondary | ICD-10-CM | POA: Diagnosis not present

## 2019-12-24 DIAGNOSIS — R059 Cough, unspecified: Secondary | ICD-10-CM | POA: Insufficient documentation

## 2019-12-24 NOTE — Progress Notes (Signed)
  [facility] Internal Medicine Residency Telephone Encounter Continuity Care Appointment  HPI:   This telephone encounter was created for [name] on 12/24/2019 for the following purpose/cc cough.   Past Medical History:  Past Medical History:  Diagnosis Date  . Acute on chronic respiratory failure with hypoxia and hypercapnia ([place]) 05/29/2019  . Diabetes mellitus    INSULIN DEPENDENT DIABETES  . Dialysis patient ([place])   . 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 ESRD (end stage renal disease) on dialysis [facility])    [facility] M/W/F  . Hypertension   . 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 Sleep apnea    USES CPAP  . Type 2 diabetes mellitus ([name])       ROS:   Negative except as per HPI.   Assessment / Plan / Recommendations:   Please see A&P under problem oriented charting for assessment of the patient's acute and chronic medical conditions.   As always, pt is advised that if symptoms worsen or new symptoms arise, they should go to an urgent care facility or to to ER for further evaluation.   Consent and Medical Decision Making:   Patient discussed with Dr. Daryll Drown  This is a telephone encounter between Seth [name] and Kathi Ludwig on 12/24/2019 for cough. The visit was conducted with the patient located at home and [employer] at [facility]. The patient's identity was confirmed using their DOB and current address. The patient has consented to being evaluated through a telephone encounter and understands the associated risks (an examination cannot be done and the patient may need to come in for an appointment) / benefits (allows the patient to remain at home, decreasing exposure to coronavirus). I personally spent 11 minutes on medical discussion.

## 2019-12-24 NOTE — Assessment & Plan Note (Signed)
Cough: Patient received the COVID vaccine last Wednesday. He had a sore shoulder the following day. He had a negative COVID test on 3/14. He has had a dry cough since around Thursday with associated trace sinus drainage. He denied shortness of breath, fever, chills, sinus drainage or hear pain, muscle aches aside from his shoulder, chest pain or joint pain. He does respond well to tylenol.  He denies requiring increased submental oxygen.  He has been compliant with supplemental oxygen and his CPAP at home.  He denies symptoms such as pedal edema indicating increased volume.  Patient denies known sick contacts. Patient is a dialysis patient MWF and completed dialysis this past Monday.  Patient's potassium 6.1 on Sunday but has not had dialysis.  There was a significant amount of time required repeating questions due to a poor phone connection and the patient holding a separate conversation with someone nearby him.  Plan: I recommend observation given the description of his symptoms He will notify us if the symptoms worsen, he requires an increased submental oxygen, he develops shortness of breath or other concerning features

## 2019-12-25 NOTE — Progress Notes (Signed)
Internal Medicine Clinic Attending  Case discussed with Dr. Berline Lopes at the time of the visit.  We reviewed the resident's history and pertinent patient test results.  I agree with the assessment, diagnosis, and plan of care documented in the resident's note.

## 2019-12-26 ENCOUNTER — Encounter (HOSPITAL_COMMUNITY): Payer: Self-pay

## 2019-12-26 ENCOUNTER — Other Ambulatory Visit: Payer: Self-pay

## 2019-12-26 ENCOUNTER — Encounter: Payer: Medicare Other | Admitting: Registered Nurse

## 2019-12-26 ENCOUNTER — Emergency Department (HOSPITAL_COMMUNITY)
Admission: EM | Admit: 2019-12-26 | Discharge: 2019-12-26 | Disposition: A | Discharge status: Home / Self Care | Payer: Medicare Other | Source: Home / Self Care | Attending: Emergency Medicine | Admitting: Emergency Medicine

## 2019-12-26 ENCOUNTER — Emergency Department (HOSPITAL_COMMUNITY): Payer: Medicare Other

## 2019-12-26 ENCOUNTER — Inpatient Hospital Stay (HOSPITAL_COMMUNITY)
Admission: EM | Admit: 2019-12-26 | Discharge: 2020-01-09 | DRG: 314 | Disposition: E | Payer: Medicare Other | Attending: Emergency Medicine | Admitting: Emergency Medicine

## 2019-12-26 DIAGNOSIS — A419 Sepsis, unspecified organism: Secondary | ICD-10-CM | POA: Diagnosis present

## 2019-12-26 DIAGNOSIS — E785 Hyperlipidemia, unspecified: Secondary | ICD-10-CM | POA: Diagnosis present

## 2019-12-26 DIAGNOSIS — R7881 Bacteremia: Secondary | ICD-10-CM | POA: Diagnosis present

## 2019-12-26 DIAGNOSIS — Z8614 Personal history of Methicillin resistant Staphylococcus aureus infection: Secondary | ICD-10-CM | POA: Diagnosis not present

## 2019-12-26 DIAGNOSIS — E46 Unspecified protein-calorie malnutrition: Secondary | ICD-10-CM | POA: Diagnosis present

## 2019-12-26 DIAGNOSIS — Z9911 Dependence on respirator [ventilator] status: Secondary | ICD-10-CM | POA: Diagnosis not present

## 2019-12-26 DIAGNOSIS — Z992 Dependence on renal dialysis: Secondary | ICD-10-CM | POA: Insufficient documentation

## 2019-12-26 DIAGNOSIS — R0689 Other abnormalities of breathing: Secondary | ICD-10-CM | POA: Diagnosis not present

## 2019-12-26 DIAGNOSIS — M48061 Spinal stenosis, lumbar region without neurogenic claudication: Secondary | ICD-10-CM | POA: Diagnosis present

## 2019-12-26 DIAGNOSIS — I1311 Hypertensive heart and chronic kidney disease without heart failure, with stage 5 chronic kidney disease, or end stage renal disease: Secondary | ICD-10-CM | POA: Diagnosis present

## 2019-12-26 DIAGNOSIS — G8929 Other chronic pain: Secondary | ICD-10-CM | POA: Diagnosis not present

## 2019-12-26 DIAGNOSIS — J9601 Acute respiratory failure with hypoxia: Secondary | ICD-10-CM | POA: Diagnosis not present

## 2019-12-26 DIAGNOSIS — E1122 Type 2 diabetes mellitus with diabetic chronic kidney disease: Secondary | ICD-10-CM | POA: Insufficient documentation

## 2019-12-26 DIAGNOSIS — Z794 Long term (current) use of insulin: Secondary | ICD-10-CM | POA: Insufficient documentation

## 2019-12-26 DIAGNOSIS — R531 Weakness: Secondary | ICD-10-CM | POA: Diagnosis not present

## 2019-12-26 DIAGNOSIS — T827XXD Infection and inflammatory reaction due to other cardiac and vascular devices, implants and grafts, subsequent encounter: Secondary | ICD-10-CM | POA: Diagnosis not present

## 2019-12-26 DIAGNOSIS — E871 Hypo-osmolality and hyponatremia: Secondary | ICD-10-CM | POA: Diagnosis not present

## 2019-12-26 DIAGNOSIS — M009 Pyogenic arthritis, unspecified: Secondary | ICD-10-CM | POA: Diagnosis present

## 2019-12-26 DIAGNOSIS — Z4659 Encounter for fitting and adjustment of other gastrointestinal appliance and device: Secondary | ICD-10-CM

## 2019-12-26 DIAGNOSIS — R52 Pain, unspecified: Secondary | ICD-10-CM | POA: Diagnosis not present

## 2019-12-26 DIAGNOSIS — R6521 Severe sepsis with septic shock: Secondary | ICD-10-CM | POA: Diagnosis not present

## 2019-12-26 DIAGNOSIS — A4102 Sepsis due to Methicillin resistant Staphylococcus aureus: Secondary | ICD-10-CM | POA: Diagnosis not present

## 2019-12-26 DIAGNOSIS — F4310 Post-traumatic stress disorder, unspecified: Secondary | ICD-10-CM | POA: Diagnosis present

## 2019-12-26 DIAGNOSIS — T827XXA Infection and inflammatory reaction due to other cardiac and vascular devices, implants and grafts, initial encounter: Principal | ICD-10-CM | POA: Diagnosis present

## 2019-12-26 DIAGNOSIS — J9621 Acute and chronic respiratory failure with hypoxia: Secondary | ICD-10-CM | POA: Diagnosis not present

## 2019-12-26 DIAGNOSIS — M791 Myalgia, unspecified site: Secondary | ICD-10-CM | POA: Insufficient documentation

## 2019-12-26 DIAGNOSIS — G9341 Metabolic encephalopathy: Secondary | ICD-10-CM | POA: Diagnosis present

## 2019-12-26 DIAGNOSIS — R682 Dry mouth, unspecified: Secondary | ICD-10-CM | POA: Diagnosis present

## 2019-12-26 DIAGNOSIS — Z79899 Other long term (current) drug therapy: Secondary | ICD-10-CM | POA: Insufficient documentation

## 2019-12-26 DIAGNOSIS — R0600 Dyspnea, unspecified: Secondary | ICD-10-CM | POA: Diagnosis not present

## 2019-12-26 DIAGNOSIS — Z7982 Long term (current) use of aspirin: Secondary | ICD-10-CM | POA: Insufficient documentation

## 2019-12-26 DIAGNOSIS — D631 Anemia in chronic kidney disease: Secondary | ICD-10-CM | POA: Diagnosis not present

## 2019-12-26 DIAGNOSIS — Z978 Presence of other specified devices: Secondary | ICD-10-CM

## 2019-12-26 DIAGNOSIS — R0602 Shortness of breath: Secondary | ICD-10-CM | POA: Diagnosis not present

## 2019-12-26 DIAGNOSIS — Z8619 Personal history of other infectious and parasitic diseases: Secondary | ICD-10-CM | POA: Diagnosis not present

## 2019-12-26 DIAGNOSIS — R410 Disorientation, unspecified: Secondary | ICD-10-CM | POA: Diagnosis not present

## 2019-12-26 DIAGNOSIS — I120 Hypertensive chronic kidney disease with stage 5 chronic kidney disease or end stage renal disease: Secondary | ICD-10-CM | POA: Insufficient documentation

## 2019-12-26 DIAGNOSIS — Z833 Family history of diabetes mellitus: Secondary | ICD-10-CM

## 2019-12-26 DIAGNOSIS — Z20822 Contact with and (suspected) exposure to covid-19: Secondary | ICD-10-CM | POA: Diagnosis not present

## 2019-12-26 DIAGNOSIS — Z743 Need for continuous supervision: Secondary | ICD-10-CM | POA: Diagnosis not present

## 2019-12-26 DIAGNOSIS — M25461 Effusion, right knee: Secondary | ICD-10-CM | POA: Diagnosis not present

## 2019-12-26 DIAGNOSIS — M5489 Other dorsalgia: Secondary | ICD-10-CM | POA: Diagnosis not present

## 2019-12-26 DIAGNOSIS — J9622 Acute and chronic respiratory failure with hypercapnia: Secondary | ICD-10-CM | POA: Diagnosis not present

## 2019-12-26 DIAGNOSIS — B9562 Methicillin resistant Staphylococcus aureus infection as the cause of diseases classified elsewhere: Secondary | ICD-10-CM | POA: Diagnosis not present

## 2019-12-26 DIAGNOSIS — D62 Acute posthemorrhagic anemia: Secondary | ICD-10-CM | POA: Diagnosis not present

## 2019-12-26 DIAGNOSIS — E1110 Type 2 diabetes mellitus with ketoacidosis without coma: Secondary | ICD-10-CM | POA: Diagnosis present

## 2019-12-26 DIAGNOSIS — D649 Anemia, unspecified: Secondary | ICD-10-CM | POA: Diagnosis present

## 2019-12-26 DIAGNOSIS — Z66 Do not resuscitate: Secondary | ICD-10-CM | POA: Diagnosis not present

## 2019-12-26 DIAGNOSIS — R918 Other nonspecific abnormal finding of lung field: Secondary | ICD-10-CM | POA: Diagnosis not present

## 2019-12-26 DIAGNOSIS — M25561 Pain in right knee: Secondary | ICD-10-CM | POA: Diagnosis not present

## 2019-12-26 DIAGNOSIS — E875 Hyperkalemia: Secondary | ICD-10-CM | POA: Diagnosis present

## 2019-12-26 DIAGNOSIS — R0902 Hypoxemia: Secondary | ICD-10-CM

## 2019-12-26 DIAGNOSIS — M549 Dorsalgia, unspecified: Secondary | ICD-10-CM | POA: Diagnosis not present

## 2019-12-26 DIAGNOSIS — M25532 Pain in left wrist: Secondary | ICD-10-CM | POA: Diagnosis present

## 2019-12-26 DIAGNOSIS — E1169 Type 2 diabetes mellitus with other specified complication: Secondary | ICD-10-CM | POA: Diagnosis present

## 2019-12-26 DIAGNOSIS — M5126 Other intervertebral disc displacement, lumbar region: Secondary | ICD-10-CM | POA: Diagnosis not present

## 2019-12-26 DIAGNOSIS — E662 Morbid (severe) obesity with alveolar hypoventilation: Secondary | ICD-10-CM | POA: Diagnosis present

## 2019-12-26 DIAGNOSIS — R509 Fever, unspecified: Secondary | ICD-10-CM | POA: Diagnosis not present

## 2019-12-26 DIAGNOSIS — I2690 Septic pulmonary embolism without acute cor pulmonale: Secondary | ICD-10-CM | POA: Diagnosis present

## 2019-12-26 DIAGNOSIS — Z9289 Personal history of other medical treatment: Secondary | ICD-10-CM

## 2019-12-26 DIAGNOSIS — N186 End stage renal disease: Secondary | ICD-10-CM | POA: Diagnosis present

## 2019-12-26 DIAGNOSIS — Z6841 Body Mass Index (BMI) 40.0 and over, adult: Secondary | ICD-10-CM

## 2019-12-26 DIAGNOSIS — K922 Gastrointestinal hemorrhage, unspecified: Secondary | ICD-10-CM | POA: Diagnosis not present

## 2019-12-26 DIAGNOSIS — R000 Tachycardia, unspecified: Secondary | ICD-10-CM | POA: Diagnosis not present

## 2019-12-26 DIAGNOSIS — G4733 Obstructive sleep apnea (adult) (pediatric): Secondary | ICD-10-CM | POA: Diagnosis not present

## 2019-12-26 DIAGNOSIS — I959 Hypotension, unspecified: Secondary | ICD-10-CM | POA: Diagnosis not present

## 2019-12-26 DIAGNOSIS — I2721 Secondary pulmonary arterial hypertension: Secondary | ICD-10-CM | POA: Diagnosis present

## 2019-12-26 DIAGNOSIS — R5383 Other fatigue: Secondary | ICD-10-CM | POA: Diagnosis not present

## 2019-12-26 DIAGNOSIS — K3189 Other diseases of stomach and duodenum: Secondary | ICD-10-CM | POA: Diagnosis present

## 2019-12-26 DIAGNOSIS — E1159 Type 2 diabetes mellitus with other circulatory complications: Secondary | ICD-10-CM | POA: Diagnosis present

## 2019-12-26 DIAGNOSIS — Z9119 Patient's noncompliance with other medical treatment and regimen: Secondary | ICD-10-CM

## 2019-12-26 LAB — POCT I-STAT EG7
Acid-base deficit: 5 mmol/L — ABNORMAL HIGH (ref 0.0–2.0)
Bicarbonate: 21.2 mmol/L (ref 20.0–28.0)
Calcium, Ion: 1.01 mmol/L — ABNORMAL LOW (ref 1.15–1.40)
HCT: 38 % — ABNORMAL LOW (ref 39.0–52.0)
Hemoglobin: 12.9 g/dL — ABNORMAL LOW (ref 13.0–17.0)
O2 Saturation: 88 %
Potassium: 5.4 mmol/L — ABNORMAL HIGH (ref 3.5–5.1)
Sodium: 124 mmol/L — ABNORMAL LOW (ref 135–145)
TCO2: 22 mmol/L (ref 22–32)
pCO2, Ven: 41.6 mmHg — ABNORMAL LOW (ref 44.0–60.0)
pH, Ven: 7.315 (ref 7.250–7.430)
pO2, Ven: 59 mmHg — ABNORMAL HIGH (ref 32.0–45.0)

## 2019-12-26 LAB — CBC WITH DIFFERENTIAL/PLATELET
Abs Immature Granulocytes: 0 10*3/uL (ref 0.00–0.07)
Basophils Absolute: 0.4 10*3/uL — ABNORMAL HIGH (ref 0.0–0.1)
Basophils Relative: 2 %
Eosinophils Absolute: 0 10*3/uL (ref 0.0–0.5)
Eosinophils Relative: 0 %
HCT: 35.4 % — ABNORMAL LOW (ref 39.0–52.0)
Hemoglobin: 11.3 g/dL — ABNORMAL LOW (ref 13.0–17.0)
Lymphocytes Relative: 6 %
Lymphs Abs: 1.2 10*3/uL (ref 0.7–4.0)
MCH: 29.4 pg (ref 26.0–34.0)
MCHC: 31.9 g/dL (ref 30.0–36.0)
MCV: 92.2 fL (ref 80.0–100.0)
Monocytes Absolute: 0.6 10*3/uL (ref 0.1–1.0)
Monocytes Relative: 3 %
Neutro Abs: 17.5 10*3/uL — ABNORMAL HIGH (ref 1.7–7.7)
Neutrophils Relative %: 89 %
Platelets: 250 10*3/uL (ref 150–400)
RBC: 3.84 MIL/uL — ABNORMAL LOW (ref 4.22–5.81)
RDW: 15.6 % — ABNORMAL HIGH (ref 11.5–15.5)
WBC: 19.7 10*3/uL — ABNORMAL HIGH (ref 4.0–10.5)
nRBC: 0 % (ref 0.0–0.2)
nRBC: 0 /100 WBC

## 2019-12-26 LAB — COMPREHENSIVE METABOLIC PANEL
ALT: 31 U/L (ref 0–44)
AST: 52 U/L — ABNORMAL HIGH (ref 15–41)
Albumin: 2.2 g/dL — ABNORMAL LOW (ref 3.5–5.0)
Alkaline Phosphatase: 213 U/L — ABNORMAL HIGH (ref 38–126)
Anion gap: 22 — ABNORMAL HIGH (ref 5–15)
BUN: 62 mg/dL — ABNORMAL HIGH (ref 6–20)
CO2: 17 mmol/L — ABNORMAL LOW (ref 22–32)
Calcium: 8.3 mg/dL — ABNORMAL LOW (ref 8.9–10.3)
Chloride: 86 mmol/L — ABNORMAL LOW (ref 98–111)
Creatinine, Ser: 10.21 mg/dL — ABNORMAL HIGH (ref 0.61–1.24)
GFR calc Af Amer: 6 mL/min — ABNORMAL LOW (ref >60)
GFR calc non Af Amer: 5 mL/min — ABNORMAL LOW (ref >60)
Glucose, Bld: 137 mg/dL — ABNORMAL HIGH (ref 70–99)
Potassium: 5.6 mmol/L — ABNORMAL HIGH (ref 3.5–5.1)
Sodium: 125 mmol/L — ABNORMAL LOW (ref 135–145)
Total Bilirubin: 1.1 mg/dL (ref 0.3–1.2)
Total Protein: 7.4 g/dL (ref 6.5–8.1)

## 2019-12-26 LAB — LACTIC ACID, PLASMA: Lactic Acid, Venous: 4.1 mmol/L (ref 0.5–1.9)

## 2019-12-26 MED ORDER — VANCOMYCIN HCL 10 G IV SOLR
2500.0000 mg | Freq: Once | INTRAVENOUS | Status: AC
  Administered 2019-12-26: 2500 mg via INTRAVENOUS
  Filled 2019-12-26: qty 2500

## 2019-12-26 MED ORDER — IOHEXOL 350 MG/ML SOLN
100.0000 mL | Freq: Once | INTRAVENOUS | Status: AC | PRN
  Administered 2019-12-26: 100 mL via INTRAVENOUS

## 2019-12-26 MED ORDER — SODIUM CHLORIDE 0.9 % IV BOLUS
250.0000 mL | Freq: Once | INTRAVENOUS | Status: AC
  Administered 2019-12-27: 250 mL via INTRAVENOUS

## 2019-12-26 MED ORDER — NOREPINEPHRINE 4 MG/250ML-% IV SOLN
0.0000 ug/min | INTRAVENOUS | Status: DC
  Administered 2019-12-26: 5 ug/min via INTRAVENOUS
  Filled 2019-12-26: qty 250

## 2019-12-26 MED ORDER — KETOROLAC TROMETHAMINE 60 MG/2ML IM SOLN
30.0000 mg | Freq: Once | INTRAMUSCULAR | Status: AC
  Administered 2019-12-26: 30 mg via INTRAMUSCULAR
  Filled 2019-12-26: qty 2

## 2019-12-26 NOTE — ED Notes (Signed)
Patient verbalizes understanding of discharge instructions. Opportunity for questioning and answers were provided. Armband removed by staff, pt discharged from ED. Wheeled out to lobby  
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
## 2019-12-26 NOTE — ED Provider Notes (Signed)
[facility] EMERGENCY DEPARTMENT Provider Note  CSN: [id number] Arrival date & time: 12/11/2019 0216  Chief Complaint(s) Back Pain  HPI Seth [name] is a 54 y.o. male with a past medical history listed below including ESRD on dialysis Monday, Wednesday, Friday who presents to the emergency department with myalgias for several days following a Covid vaccine.  Patient reports that the myalgias have made his chronic back pain worse.  States that he hurts all over.  Has been trying to take Tylenol and Motrin with minimal relief.  Patient initially had fevers but has not had a fever since.  No chest pain or shortness of breath.  No nausea or vomiting.  No other physical complaints.  Patient reports that he has made his dialysis sessions and went yesterday.  HPI  Past Medical History Past Medical History:  Diagnosis Date  . Acute on chronic respiratory failure with hypoxia and hypercapnia ([place]) 05/29/2019  . Diabetes mellitus    INSULIN DEPENDENT DIABETES  . Dialysis patient ([place])   . 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 ESRD (end stage renal disease) on dialysis [facility])    [facility] M/W/F  . Hypertension   . 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 Sleep apnea    USES CPAP  . Type 2 diabetes mellitus [facility])    Patient Active Problem List   Diagnosis Date Noted  . Cough 12/24/2019  . Cellulitis 10/15/2019  . Furuncle of groin 10/01/2019  . Other spondylosis with radiculopathy, lumbar region 09/25/2019  . Chronic respiratory failure with hypoxia and hypercapnia ([place]) 08/29/2019  . PTSD (post-traumatic stress disorder) 08/29/2019  . Pressure injury of skin 08/23/2019  . Acute kidney injury ([place]) 08/22/2019  . Hypoxia 08/16/2019  . Eczema 08/16/2019  . Chronic, continuous use of opioids 08/16/2019  . Furuncles 04/30/2019  . NSTEMI (non-ST elevated myocardial infarction) ([place]) 11/29/2018  . Hyponatremia 11/29/2018    . Hyperkalemia 09/03/2018  . Nightmares 12/28/2017  . Preventative health care 10/01/2015  . Primary osteoarthritis of right knee   . Cord compression myelopathy ([place]) 08/17/2015  . Muscle pain, lumbar 08/11/2015  . Controlled type 2 diabetes mellitus with chronic kidney disease on chronic dialysis ([place]) 07/20/2015  . Absolute anemia   . OSA (obstructive sleep apnea) 10/14/2013  . ESRD (end stage renal disease) on dialysis ([place]) 04/30/2012  . Hyperlipidemia associated with type 2 diabetes mellitus ([place]) 06/20/2007  . Obesity hypoventilation syndrome ([place]) 06/20/2007  . Hypertension associated with diabetes ([place]) 06/20/2007   Home Medication(s) Prior to Admission medications   Medication Sig Start Date End Date Taking? Authorizing Provider  amLODipine (NORVASC) 10 MG tablet Take 1 tablet (10 mg total) by mouth daily. 08/21/19   Mitzi Hansen, MD  aspirin EC 81 MG tablet Take 81 mg by mouth daily.    [provider]  atorvastatin (LIPITOR) 40 MG tablet Take 1 tablet (40 mg total) by mouth daily at 6 PM. 11/26/19   Darrick Meigs, Rylee, MD  doxycycline (ADOXA) 100 MG tablet Take 1 tablet (100 mg total) by mouth 2 (two) times daily. Patient not taking: Reported on 12/22/2019 10/15/19   Ina Homes, MD  ferric citrate (AURYXIA) 1 GM 210 MG(Fe) tablet Take 420-840 mg by mouth See admin instructions. Take 4 tablets (840 mg) by mouth with meals and 2 tablets (420 mg) with snacks    [provider]  insulin degludec (TRESIBA FLEXTOUCH) 100 UNIT/ML SOPN FlexTouch Pen  Inject 0.14 mLs (14 Units total) into the skin daily. 11/01/19   Mitzi Hansen, MD  Semaglutide,0.25 or 0.5MG /DOS, (OZEMPIC, 0.25 OR 0.5 MG/DOSE,) 2 MG/1.5ML SOPN Inject 0.25 mg into the skin once a week. 09/17/19   Ina Homes, MD  sertraline (ZOLOFT) 50 MG tablet Take 1 tablet (50 mg total) by mouth daily. Patient not taking: Reported on 12/22/2019 11/26/19   Mitzi Hansen, MD  traMADol (ULTRAM) 50 MG tablet  Take 1 tablet (50 mg total) by mouth 2 (two) times daily. 11/06/19   Meredith Staggers, MD                                                                                                                                    Past Surgical History Past Surgical History:  Procedure Laterality Date  . A/V FISTULAGRAM N/A 10/18/2018   Procedure: A/V FISTULAGRAM;  Surgeon: Marty Heck, MD;  Location: [place] [facility];  Service: Cardiovascular;  Laterality: N/A;  . A/V FISTULAGRAM Left 03/28/2019   Procedure: A/V Fistulagram;  Surgeon: Marty Heck, MD;  Location: [place] [facility];  Service: Cardiovascular;  Laterality: Left;  . AV FISTULA PLACEMENT  05/03/2012   Procedure: ARTERIOVENOUS (AV) FISTULA CREATION;  Surgeon: Mal [name], MD;  Location: [place];  Service: Vascular;  Laterality: Right;  . AV FISTULA PLACEMENT Left 10/23/2018   Procedure: ARTERIOVENOUS (AV) FISTULA CREATION;  Surgeon: Waynetta Sandy, MD;  Location: [place];  Service: Vascular;  Laterality: Left;  . BASCILIC VEIN TRANSPOSITION Left 04/02/2019   Procedure: BASILIC VEIN TRANSPOSITION SECOND STAGE LEFT;  Surgeon: Waynetta Sandy, MD;  Location: [place];  Service: Vascular;  Laterality: Left;  . INSERTION OF DIALYSIS CATHETER Left 12/03/2018   Procedure: INSERTION OF DIALYSIS CATHETER;  Surgeon: Marty Heck, MD;  Location: [place];  Service: Vascular;  Laterality: Left;  . INSERTION OF DIALYSIS CATHETER Left 06/03/2019   Procedure: INSERTION OF DIALYSIS CATHETER;  Surgeon: Marty Heck, MD;  Location: [place];  Service: Vascular;  Laterality: Left;  . ORIF MANDIBULAR FRACTURE N/A 11/10/2017   Procedure: OPEN REDUCTION INTERNAL FIXATION (ORIF) MANDIBULAR FRACTURE POSSIBLE MAXILLARY MANDIBULAR FIXATION;  Surgeon: Jodi Marble, MD;  Location: [place];  Service: ENT;  Laterality: N/A;  . REVISON OF ARTERIOVENOUS FISTULA Right 05/31/2018   Procedure: REVISION OF ARTERIOVENOUS FISTULA  RIGHT ARM  WITH INTERPOSITION ARTEGRAFT 6MM X 16CM GRAFT;  Surgeon: Waynetta Sandy, MD;  Location: [place];  Service: Vascular;  Laterality: Right;  . TEE WITHOUT CARDIOVERSION N/A 07/10/2015   Procedure: TRANSESOPHAGEAL ECHOCARDIOGRAM (TEE);  Surgeon: Josue Hector, MD;  Location: [place];  Service: Cardiovascular;  Laterality: N/A;  . TEE WITHOUT CARDIOVERSION N/A 12/07/2018   Procedure: TRANSESOPHAGEAL ECHOCARDIOGRAM (TEE);  Surgeon: Acie Fredrickson Wonda Cheng, MD;  Location: [place];  Service: Cardiovascular;  Laterality: N/A;  . TEE WITHOUT CARDIOVERSION N/A 06/03/2019   Procedure: Transesophageal Echocardiogram (Tee);  Surgeon:  Nahser, Wonda Cheng, MD;  Location: [place];  Service: Cardiovascular;  Laterality: N/A;  . tracheostomy removal    . TRACHEOSTOMY TUBE PLACEMENT N/A 08/20/2013   Procedure: TRACHEOSTOMY Revision;  Surgeon: Melida Quitter, MD;  Location: [place];  Service: ENT;  Laterality: N/A;  . UPPER EXTREMITY VENOGRAPHY Bilateral 03/28/2019   Procedure: UPPER EXTREMITY VENOGRAPHY;  Surgeon: Marty Heck, MD;  Location: [place] [facility];  Service: Cardiovascular;  Laterality: Bilateral;   Family History Family History  Problem Relation Age of Onset  . Diabetes Mother   . Cancer Mother   . Diabetes Sister   . Diabetes Brother     Social History Social History   Tobacco Use  . Smoking status: Never Smoker  . Smokeless tobacco: Never Used  Substance Use Topics  . Alcohol use: No  . Drug use: Not Currently    Types: Marijuana   Allergies Patient has no known allergies.  Review of Systems Review of Systems All other systems are reviewed and are negative for acute change except as noted in the HPI  Physical Exam Vital Signs  I have reviewed the triage vital signs BP 98/76 (BP Location: Right Leg)   Pulse 87   Temp 98.3 F (36.8 C) (Oral)   Resp 20   SpO2 96%   Physical Exam Vitals reviewed.  Constitutional:      General: He is not in acute distress.     Appearance: He is well-developed. He is obese. He is not diaphoretic.  HENT:     Head: Normocephalic and atraumatic.     Jaw: No trismus.     Right Ear: External ear normal.     Left Ear: External ear normal.     Nose: Nose normal.  Eyes:     General: No scleral icterus.    Conjunctiva/sclera: Conjunctivae normal.  Neck:     Trachea: Phonation normal.  Cardiovascular:     Rate and Rhythm: Normal rate and regular rhythm.  Pulmonary:     Effort: Pulmonary effort is normal. No respiratory distress.     Breath sounds: No stridor.  Chest:    Abdominal:     General: There is no distension.  Musculoskeletal:        General: Normal range of motion.     Cervical back: Normal range of motion. Tenderness present. No rigidity or bony tenderness. Muscular tenderness present. No spinous process tenderness. Normal range of motion.     Thoracic back: Tenderness present. No bony tenderness.     Lumbar back: Tenderness present. No bony tenderness.  Neurological:     Mental Status: He is alert and oriented to person, place, and time.  Psychiatric:        Behavior: Behavior normal.     ED Results and Treatments Labs (all labs ordered are listed, but only abnormal results are displayed) Labs Reviewed - No data to display  EKG  EKG Interpretation  Date/Time:    Ventricular Rate:    PR Interval:    QRS Duration:   QT Interval:    QTC Calculation:   R Axis:     Text Interpretation:        Radiology No results found.  Pertinent labs & imaging results that were available during my care of the patient were reviewed by me and considered in my medical decision making (see chart for details).  Medications Ordered in ED Medications  ketorolac (TORADOL) injection 30 mg (has no administration in time range)                                                                                                                                     Procedures Procedures  (including critical care time)  Medical Decision Making / ED Course I have reviewed the nursing notes for this encounter and the patient's prior records (if available in EHR or on provided paperwork).   Seth [name] was evaluated in Emergency Department on 01/02/2020 for the symptoms described in the history of present illness. He was evaluated in the context of the global COVID-19 pandemic, which necessitated consideration that the patient might be at risk for infection with the SARS-CoV-2 virus that causes COVID-19. Institutional protocols and algorithms that pertain to the evaluation of patients at risk for COVID-19 are in a state of rapid change based on information released by regulatory bodies including the CDC and federal and state organizations. These policies and algorithms were followed during the patient's care in the ED.  Patient is endorsing myalgias following Covid vaccine shot.  He is well-appearing and well-hydrated, nontoxic.  He is afebrile with stable vital signs.  He has diffuse tenderness to palpation along the musculatures of the back.  No evidence of cellulitis. No rashes.  Patient was seen by his primary care provider 2 days ago for the same who recommended continued supportive management.  Reemphasized supportive management.  Given IM Toradol here.      Final Clinical Impression(s) / ED Diagnoses Final diagnoses:  Myalgia    The patient appears reasonably screened and/or stabilized for discharge and I doubt any other medical condition or other [facility] requiring further screening, evaluation, or treatment in the ED at this time prior to discharge. Safe for discharge with strict return precautions.  Disposition: Discharge  Condition: Good  I have discussed the results, Dx and Tx plan with the patient/family who expressed understanding and agree(s) with the plan. Discharge  instructions discussed at length. The patient/family was given strict return precautions who verbalized understanding of the instructions. No further questions at time of discharge.    ED Discharge Orders    None        Follow Up: Primary care provider  Schedule an appointment as soon as possible for a visit       This chart was dictated using voice recognition  software.  Despite best efforts to proofread,  errors can occur which can change the documentation meaning.   Fatima Blank, MD 12/29/2019 [phone]

## 2019-12-26 NOTE — ED Triage Notes (Addendum)
Pt comes via [facility] from home, called by family for being AMS, AxO x 1, has been seen twice this week for malaise and back pain, pt has been noncompliant with medication and dialysis, last dialysis yesterday. Pt is confused and diaphoretic

## 2019-12-26 NOTE — ED Triage Notes (Signed)
Pt comes via [facility] for back pain and generalized  malaise for the past several days since receiving covid vaccine, seen here for the same on Sat

## 2019-12-26 NOTE — ED Notes (Signed)
Kristeen Miss sister [phone] looking for an update

## 2019-12-27 ENCOUNTER — Other Ambulatory Visit: Payer: Self-pay

## 2019-12-27 ENCOUNTER — Inpatient Hospital Stay (HOSPITAL_COMMUNITY): Payer: Medicare Other

## 2019-12-27 ENCOUNTER — Encounter (HOSPITAL_COMMUNITY): Payer: Self-pay | Admitting: Pulmonary Disease

## 2019-12-27 DIAGNOSIS — A419 Sepsis, unspecified organism: Secondary | ICD-10-CM | POA: Diagnosis not present

## 2019-12-27 DIAGNOSIS — Z20822 Contact with and (suspected) exposure to covid-19: Secondary | ICD-10-CM | POA: Diagnosis present

## 2019-12-27 DIAGNOSIS — B9562 Methicillin resistant Staphylococcus aureus infection as the cause of diseases classified elsewhere: Secondary | ICD-10-CM

## 2019-12-27 DIAGNOSIS — N186 End stage renal disease: Secondary | ICD-10-CM

## 2019-12-27 DIAGNOSIS — Z4682 Encounter for fitting and adjustment of non-vascular catheter: Secondary | ICD-10-CM | POA: Diagnosis not present

## 2019-12-27 DIAGNOSIS — D62 Acute posthemorrhagic anemia: Secondary | ICD-10-CM | POA: Diagnosis not present

## 2019-12-27 DIAGNOSIS — Z8614 Personal history of Methicillin resistant Staphylococcus aureus infection: Secondary | ICD-10-CM | POA: Diagnosis not present

## 2019-12-27 DIAGNOSIS — I120 Hypertensive chronic kidney disease with stage 5 chronic kidney disease or end stage renal disease: Secondary | ICD-10-CM | POA: Diagnosis not present

## 2019-12-27 DIAGNOSIS — M7989 Other specified soft tissue disorders: Secondary | ICD-10-CM | POA: Diagnosis not present

## 2019-12-27 DIAGNOSIS — G9340 Encephalopathy, unspecified: Secondary | ICD-10-CM | POA: Diagnosis not present

## 2019-12-27 DIAGNOSIS — R6521 Severe sepsis with septic shock: Secondary | ICD-10-CM | POA: Diagnosis not present

## 2019-12-27 DIAGNOSIS — R0902 Hypoxemia: Secondary | ICD-10-CM | POA: Diagnosis not present

## 2019-12-27 DIAGNOSIS — A4102 Sepsis due to Methicillin resistant Staphylococcus aureus: Secondary | ICD-10-CM | POA: Diagnosis present

## 2019-12-27 DIAGNOSIS — Z66 Do not resuscitate: Secondary | ICD-10-CM | POA: Diagnosis present

## 2019-12-27 DIAGNOSIS — R7881 Bacteremia: Secondary | ICD-10-CM | POA: Diagnosis not present

## 2019-12-27 DIAGNOSIS — E662 Morbid (severe) obesity with alveolar hypoventilation: Secondary | ICD-10-CM | POA: Diagnosis present

## 2019-12-27 DIAGNOSIS — R410 Disorientation, unspecified: Secondary | ICD-10-CM | POA: Diagnosis not present

## 2019-12-27 DIAGNOSIS — E875 Hyperkalemia: Secondary | ICD-10-CM | POA: Diagnosis not present

## 2019-12-27 DIAGNOSIS — D631 Anemia in chronic kidney disease: Secondary | ICD-10-CM | POA: Diagnosis present

## 2019-12-27 DIAGNOSIS — T827XXA Infection and inflammatory reaction due to other cardiac and vascular devices, implants and grafts, initial encounter: Secondary | ICD-10-CM | POA: Diagnosis present

## 2019-12-27 DIAGNOSIS — J9621 Acute and chronic respiratory failure with hypoxia: Secondary | ICD-10-CM | POA: Diagnosis present

## 2019-12-27 DIAGNOSIS — M1711 Unilateral primary osteoarthritis, right knee: Secondary | ICD-10-CM | POA: Diagnosis not present

## 2019-12-27 DIAGNOSIS — M25561 Pain in right knee: Secondary | ICD-10-CM | POA: Diagnosis not present

## 2019-12-27 DIAGNOSIS — E1110 Type 2 diabetes mellitus with ketoacidosis without coma: Secondary | ICD-10-CM | POA: Diagnosis not present

## 2019-12-27 DIAGNOSIS — M549 Dorsalgia, unspecified: Secondary | ICD-10-CM | POA: Diagnosis not present

## 2019-12-27 DIAGNOSIS — R509 Fever, unspecified: Secondary | ICD-10-CM | POA: Diagnosis not present

## 2019-12-27 DIAGNOSIS — E1139 Type 2 diabetes mellitus with other diabetic ophthalmic complication: Secondary | ICD-10-CM | POA: Diagnosis not present

## 2019-12-27 DIAGNOSIS — J9601 Acute respiratory failure with hypoxia: Secondary | ICD-10-CM | POA: Diagnosis not present

## 2019-12-27 DIAGNOSIS — E871 Hypo-osmolality and hyponatremia: Secondary | ICD-10-CM | POA: Diagnosis present

## 2019-12-27 DIAGNOSIS — E46 Unspecified protein-calorie malnutrition: Secondary | ICD-10-CM | POA: Diagnosis present

## 2019-12-27 DIAGNOSIS — Z992 Dependence on renal dialysis: Secondary | ICD-10-CM | POA: Diagnosis not present

## 2019-12-27 DIAGNOSIS — I1311 Hypertensive heart and chronic kidney disease without heart failure, with stage 5 chronic kidney disease, or end stage renal disease: Secondary | ICD-10-CM | POA: Diagnosis present

## 2019-12-27 DIAGNOSIS — R918 Other nonspecific abnormal finding of lung field: Secondary | ICD-10-CM | POA: Diagnosis not present

## 2019-12-27 DIAGNOSIS — M009 Pyogenic arthritis, unspecified: Secondary | ICD-10-CM | POA: Diagnosis present

## 2019-12-27 DIAGNOSIS — E1122 Type 2 diabetes mellitus with diabetic chronic kidney disease: Secondary | ICD-10-CM | POA: Diagnosis not present

## 2019-12-27 DIAGNOSIS — J9622 Acute and chronic respiratory failure with hypercapnia: Secondary | ICD-10-CM | POA: Diagnosis present

## 2019-12-27 DIAGNOSIS — Z6841 Body Mass Index (BMI) 40.0 and over, adult: Secondary | ICD-10-CM | POA: Diagnosis not present

## 2019-12-27 DIAGNOSIS — Z8619 Personal history of other infectious and parasitic diseases: Secondary | ICD-10-CM

## 2019-12-27 DIAGNOSIS — M25461 Effusion, right knee: Secondary | ICD-10-CM | POA: Diagnosis not present

## 2019-12-27 DIAGNOSIS — M48061 Spinal stenosis, lumbar region without neurogenic claudication: Secondary | ICD-10-CM | POA: Diagnosis present

## 2019-12-27 DIAGNOSIS — M545 Low back pain: Secondary | ICD-10-CM | POA: Diagnosis not present

## 2019-12-27 DIAGNOSIS — G9341 Metabolic encephalopathy: Secondary | ICD-10-CM | POA: Diagnosis not present

## 2019-12-27 DIAGNOSIS — Z4901 Encounter for fitting and adjustment of extracorporeal dialysis catheter: Secondary | ICD-10-CM | POA: Diagnosis not present

## 2019-12-27 DIAGNOSIS — M25532 Pain in left wrist: Secondary | ICD-10-CM | POA: Diagnosis not present

## 2019-12-27 DIAGNOSIS — E1129 Type 2 diabetes mellitus with other diabetic kidney complication: Secondary | ICD-10-CM | POA: Diagnosis not present

## 2019-12-27 DIAGNOSIS — M5126 Other intervertebral disc displacement, lumbar region: Secondary | ICD-10-CM | POA: Diagnosis present

## 2019-12-27 HISTORY — PX: IR REMOVAL TUN CV CATH W/O FL: IMG2289

## 2019-12-27 LAB — RENAL FUNCTION PANEL
Albumin: 1.8 g/dL — ABNORMAL LOW (ref 3.5–5.0)
Anion gap: 19 — ABNORMAL HIGH (ref 5–15)
BUN: 74 mg/dL — ABNORMAL HIGH (ref 6–20)
CO2: 17 mmol/L — ABNORMAL LOW (ref 22–32)
Calcium: 7.6 mg/dL — ABNORMAL LOW (ref 8.9–10.3)
Chloride: 87 mmol/L — ABNORMAL LOW (ref 98–111)
Creatinine, Ser: 10.5 mg/dL — ABNORMAL HIGH (ref 0.61–1.24)
GFR calc Af Amer: 6 mL/min — ABNORMAL LOW (ref >60)
GFR calc non Af Amer: 5 mL/min — ABNORMAL LOW (ref >60)
Glucose, Bld: 120 mg/dL — ABNORMAL HIGH (ref 70–99)
Phosphorus: 6.1 mg/dL — ABNORMAL HIGH (ref 2.5–4.6)
Potassium: 5.5 mmol/L — ABNORMAL HIGH (ref 3.5–5.1)
Sodium: 123 mmol/L — ABNORMAL LOW (ref 135–145)

## 2019-12-27 LAB — CBC
HCT: 34.2 % — ABNORMAL LOW (ref 39.0–52.0)
HCT: 34.8 % — ABNORMAL LOW (ref 39.0–52.0)
Hemoglobin: 10.9 g/dL — ABNORMAL LOW (ref 13.0–17.0)
Hemoglobin: 11.4 g/dL — ABNORMAL LOW (ref 13.0–17.0)
MCH: 29.4 pg (ref 26.0–34.0)
MCH: 29.5 pg (ref 26.0–34.0)
MCHC: 31.9 g/dL (ref 30.0–36.0)
MCHC: 32.8 g/dL (ref 30.0–36.0)
MCV: 92.2 fL (ref 80.0–100.0)
MCV: 89.9 fL (ref 80.0–100.0)
Platelets: 241 10*3/uL (ref 150–400)
Platelets: 247 10*3/uL (ref 150–400)
RBC: 3.71 MIL/uL — ABNORMAL LOW (ref 4.22–5.81)
RBC: 3.87 MIL/uL — ABNORMAL LOW (ref 4.22–5.81)
RDW: 15.8 % — ABNORMAL HIGH (ref 11.5–15.5)
RDW: 15.9 % — ABNORMAL HIGH (ref 11.5–15.5)
WBC: 22.8 10*3/uL — ABNORMAL HIGH (ref 4.0–10.5)
WBC: 13.5 10*3/uL — ABNORMAL HIGH (ref 4.0–10.5)
nRBC: 0 % (ref 0.0–0.2)
nRBC: 0.1 % (ref 0.0–0.2)

## 2019-12-27 LAB — BASIC METABOLIC PANEL
Anion gap: 22 — ABNORMAL HIGH (ref 5–15)
BUN: 62 mg/dL — ABNORMAL HIGH (ref 6–20)
CO2: 17 mmol/L — ABNORMAL LOW (ref 22–32)
Calcium: 8.2 mg/dL — ABNORMAL LOW (ref 8.9–10.3)
Chloride: 89 mmol/L — ABNORMAL LOW (ref 98–111)
Creatinine, Ser: 9.93 mg/dL — ABNORMAL HIGH (ref 0.61–1.24)
GFR calc Af Amer: 6 mL/min — ABNORMAL LOW (ref >60)
GFR calc non Af Amer: 5 mL/min — ABNORMAL LOW (ref >60)
Glucose, Bld: 115 mg/dL — ABNORMAL HIGH (ref 70–99)
Potassium: 5.3 mmol/L — ABNORMAL HIGH (ref 3.5–5.1)
Sodium: 128 mmol/L — ABNORMAL LOW (ref 135–145)

## 2019-12-27 LAB — BLOOD CULTURE ID PANEL (REFLEXED)

## 2019-12-27 LAB — GLUCOSE, CAPILLARY: Glucose-Capillary: 112 mg/dL — ABNORMAL HIGH (ref 70–99)

## 2019-12-27 LAB — RESPIRATORY PANEL BY RT PCR (FLU A&B, COVID)
Influenza A by PCR: NEGATIVE
Influenza B by PCR: NEGATIVE
SARS Coronavirus 2 by RT PCR: NEGATIVE

## 2019-12-27 LAB — MAGNESIUM: Magnesium: 1.6 mg/dL — ABNORMAL LOW (ref 1.7–2.4)

## 2019-12-27 LAB — LACTIC ACID, PLASMA: Lactic Acid, Venous: 2.4 mmol/L (ref 0.5–1.9)

## 2019-12-27 LAB — PHOSPHORUS: Phosphorus: 6 mg/dL — ABNORMAL HIGH (ref 2.5–4.6)

## 2019-12-27 LAB — CBG MONITORING, ED
Glucose-Capillary: 129 mg/dL — ABNORMAL HIGH (ref 70–99)
Glucose-Capillary: 136 mg/dL — ABNORMAL HIGH (ref 70–99)
Glucose-Capillary: 115 mg/dL — ABNORMAL HIGH (ref 70–99)

## 2019-12-27 LAB — CORTISOL: Cortisol, Plasma: 37.6 ug/dL

## 2019-12-27 LAB — ECHOCARDIOGRAM COMPLETE
Height: 67.5 in
Weight: 4320 oz

## 2019-12-27 LAB — HEMOGLOBIN A1C
Hgb A1c MFr Bld: 7.1 % — ABNORMAL HIGH (ref 4.8–5.6)
Mean Plasma Glucose: 157.07 mg/dL

## 2019-12-27 LAB — PROTIME-INR
INR: 1.4 — ABNORMAL HIGH (ref 0.8–1.2)
Prothrombin Time: 16.8 seconds — ABNORMAL HIGH (ref 11.4–15.2)

## 2019-12-27 LAB — PROCALCITONIN: Procalcitonin: >150 ng/mL

## 2019-12-27 LAB — APTT: aPTT: 42 seconds — ABNORMAL HIGH (ref 24–36)

## 2019-12-27 MED ORDER — ALTEPLASE 2 MG IJ SOLR
2.0000 mg | Freq: Once | INTRAMUSCULAR | Status: DC | PRN
  Filled 2019-12-27: qty 2

## 2019-12-27 MED ORDER — PENTAFLUOROPROP-TETRAFLUOROETH EX AERO
1.0000 "application " | INHALATION_SPRAY | CUTANEOUS | Status: DC | PRN
  Filled 2019-12-27: qty 116

## 2019-12-27 MED ORDER — SODIUM CHLORIDE 0.9 % IV SOLN
250.0000 mL | INTRAVENOUS | Status: DC
  Administered 2019-12-27: 05:00:00 250 mL via INTRAVENOUS

## 2019-12-27 MED ORDER — INSULIN ASPART 100 UNIT/ML ~~LOC~~ SOLN
0.0000 [IU] | Freq: Three times a day (TID) | SUBCUTANEOUS | Status: DC
  Administered 2019-12-27 – 2019-12-29 (×5): 3 [IU] via SUBCUTANEOUS
  Administered 2019-12-30 – 2019-12-31 (×2): 4 [IU] via SUBCUTANEOUS
  Administered 2019-12-31: 3 [IU] via SUBCUTANEOUS
  Administered 2020-01-01 – 2020-01-03 (×7): 4 [IU] via SUBCUTANEOUS
  Administered 2020-01-04 (×3): 7 [IU] via SUBCUTANEOUS

## 2019-12-27 MED ORDER — CHLORHEXIDINE GLUCONATE CLOTH 2 % EX PADS
6.0000 | MEDICATED_PAD | Freq: Every day | CUTANEOUS | Status: DC
  Administered 2019-12-28 – 2020-01-06 (×10): 6 via TOPICAL

## 2019-12-27 MED ORDER — NOREPINEPHRINE 4 MG/250ML-% IV SOLN
2.0000 ug/min | INTRAVENOUS | Status: DC
  Administered 2019-12-27: 5 ug/min via INTRAVENOUS
  Filled 2019-12-27: qty 250

## 2019-12-27 MED ORDER — LIDOCAINE HCL 1 % IJ SOLN
INTRAMUSCULAR | Status: AC | PRN
  Administered 2019-12-27: 10 mL

## 2019-12-27 MED ORDER — LIDOCAINE-PRILOCAINE 2.5-2.5 % EX CREA
1.0000 "application " | TOPICAL_CREAM | CUTANEOUS | Status: DC | PRN
  Filled 2019-12-27: qty 5

## 2019-12-27 MED ORDER — SODIUM CHLORIDE 0.9 % IV SOLN
100.0000 mL | INTRAVENOUS | Status: DC | PRN
  Administered 2020-01-02 – 2020-01-03 (×5): 100 mL via INTRAVENOUS

## 2019-12-27 MED ORDER — CHLORHEXIDINE GLUCONATE 4 % EX LIQD
CUTANEOUS | Status: AC
  Filled 2019-12-27: qty 15

## 2019-12-27 MED ORDER — LIDOCAINE HCL (PF) 1 % IJ SOLN: 5.0000 mL | INTRAMUSCULAR | Status: DC | PRN

## 2019-12-27 MED ORDER — PERFLUTREN LIPID MICROSPHERE
1.0000 mL | INTRAVENOUS | Status: AC | PRN
  Administered 2019-12-27: 2 mL via INTRAVENOUS
  Filled 2019-12-27: qty 10

## 2019-12-27 MED ORDER — ACETAMINOPHEN 325 MG PO TABS
650.0000 mg | ORAL_TABLET | ORAL | Status: DC | PRN
  Administered 2019-12-27 – 2020-01-05 (×9): 650 mg via ORAL
  Filled 2019-12-27 (×10): qty 2

## 2019-12-27 MED ORDER — HEPARIN SODIUM (PORCINE) 5000 UNIT/ML IJ SOLN
5000.0000 [IU] | Freq: Three times a day (TID) | INTRAMUSCULAR | Status: DC
  Administered 2019-12-27 – 2020-01-05 (×26): 5000 [IU] via SUBCUTANEOUS
  Filled 2019-12-27 (×26): qty 1

## 2019-12-27 MED ORDER — PIPERACILLIN-TAZOBACTAM IN DEX 2-0.25 GM/50ML IV SOLN
2.2500 g | Freq: Three times a day (TID) | INTRAVENOUS | Status: DC
  Administered 2019-12-27: 2.25 g via INTRAVENOUS
  Filled 2019-12-27 (×2): qty 50

## 2019-12-27 MED ORDER — HEPARIN SODIUM (PORCINE) 1000 UNIT/ML DIALYSIS
1000.0000 [IU] | INTRAMUSCULAR | Status: DC | PRN
  Filled 2019-12-27: qty 1

## 2019-12-27 MED ORDER — SODIUM ZIRCONIUM CYCLOSILICATE 10 G PO PACK
10.0000 g | PACK | Freq: Two times a day (BID) | ORAL | Status: AC
  Administered 2019-12-27 (×2): 10 g via ORAL
  Filled 2019-12-27 (×2): qty 1

## 2019-12-27 MED ORDER — VANCOMYCIN HCL IN DEXTROSE 1-5 GM/200ML-% IV SOLN
1000.0000 mg | INTRAVENOUS | Status: DC
  Administered 2019-12-27: 1000 mg via INTRAVENOUS
  Filled 2019-12-27: qty 200

## 2019-12-27 MED ORDER — SODIUM CHLORIDE 0.9 % IV SOLN: INTRAVENOUS | Status: AC

## 2019-12-27 MED ORDER — IOHEXOL 300 MG/ML  SOLN
100.0000 mL | Freq: Once | INTRAMUSCULAR | Status: AC | PRN
  Administered 2019-12-27: 100 mL via INTRAVENOUS

## 2019-12-27 MED ORDER — HEPARIN SODIUM (PORCINE) 1000 UNIT/ML DIALYSIS
5000.0000 [IU] | Freq: Once | INTRAMUSCULAR | Status: AC
  Filled 2019-12-27: qty 5

## 2019-12-27 MED ORDER — PIPERACILLIN-TAZOBACTAM 3.375 G IVPB 30 MIN
3.3750 g | Freq: Once | INTRAVENOUS | Status: AC
  Administered 2019-12-27: 3.375 g via INTRAVENOUS
  Filled 2019-12-27: qty 50

## 2019-12-27 MED ORDER — CHLORHEXIDINE GLUCONATE 4 % EX LIQD
CUTANEOUS | Status: AC | PRN
  Administered 2019-12-27: 1 via TOPICAL

## 2019-12-27 MED ORDER — LIDOCAINE HCL 1 % IJ SOLN
INTRAMUSCULAR | Status: AC
  Filled 2019-12-27: qty 20

## 2019-12-27 MED ORDER — SODIUM CHLORIDE 0.9 % IV SOLN: 100.0000 mL | INTRAVENOUS | Status: DC | PRN

## 2019-12-27 NOTE — Procedures (Signed)
Successful removal of tunneled (L)IJ hemodialysis catheter. Purulence expressed from the tract. No complications.  Ascencion Dike PA-C Interventional Radiology 12/27/2019 11:26 AM

## 2019-12-27 NOTE — H&P (Signed)
NAME:  Seth [name], MRN:  [id number], DOB:  05-06-1966, LOS: 0 ADMISSION DATE:  12/30/2019, CONSULTATION DATE:  3/19 REFERRING MD:  Dr. Ralene Bathe EDP, CHIEF COMPLAINT:  AMS   Brief History   54 year old male with MMP presented with back pain and AMS 3/19 and was admitted with presumed septic shock from unclear source, but possibly bacteremic from tunneled HD catheter.   History of present illness   54 year old male with PMH as below, which is significant for ESRD on HD, chronic hypoxemic respiratory failure no home O2, OSA on CPAP, DM, discitis, and cord compression myelopathy. Most recent HD was 3/17. He left the session 45 mins early due to back pain. He was in his usual state of health until approximately 3/11 when he developed muscle aches and back pain. He had just received his 1st COVID-19 vaccine a few days prior and it was felt these symptoms were secondary to this. He has been seen twice in the ED in the past week. First for complaints of febrile illness. He was not given any antibiotics at that time. He then returned 3/18 with complaints of myalgias. He returned to ED later in the day 3/18 via EMS after being found to have altered mental status at home. Upon arrival to ED he was noted to be hypotensive with SBP in the 60s and hypoxemic with O2 sats in the 70s on room air. Improved with supplemental O2 and levophed infusion after 270mL bolus. There were also concerns he had been unable to use his legs the past few days and MRI spine was ordered. PCCM asked to evaluate for admission due to need for vasoactive infusion.   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   Significant  Hospital Events   3/18 admit  Consults:    Procedures:    Significant Diagnostic Tests:  CTA chest 3/18 > No central pulmonary embolism. Multifocal bilateral pulmonary peripheral nodularities, the larger with central cavitation. 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 Stable T10 lytic lesion, likely a benign hemangioma. T11-L1 smaller lytic lesions, possibly degenerative. MRI spine 3/19 >  Micro Data:  Blood cultures 3/18 >  Antimicrobials:  Zosyn 3/18 > Vancomycin 3/18 >  Interim history/subjective:    Objective   Blood pressure (!) 98/59, pulse 87, temperature 98.7 F (37.1 C), temperature source Temporal, resp. rate (!) 23, SpO2 93 %.       No intake or output data in the 24 hours ending 12/27/19 0159 There were no vitals filed for this visit.  Examination: General: morbidly obese middle aged male in NAD HENT: [place]/AT, PERRL, no JVD Lungs: Clear bilateral breath sounds Cardiovascular: RRR, no MRG Abdomen: Soft, non-tender, non-distended Extremities: No acute deformity. AV fistula L forearm with +thrill Neuro: Alert, oriented, non-focal. Very pleasant. Able to move both lower extremities with 4/5 strength and + sensation.  Skin: purulent discharge from tunneled catheter site.   Resolved Hospital Problem list     Assessment & Plan:   Shock: likely a component of septic and hypovolemic shock.  - Gentle IVF resuscitation (only 231mL given in ED) - Continue levophed for  MAP goal 65 (currently 3mcg) - Check cortisol - Echocardiogram - Ensure lactic clearing  Sepsis: concern he could be bacteremic from HD cath site. Purulent drainage at site.  - Empiric antibiotics as above - Blood cultures pending - Echo to assess for vegetation - Defer central access if possible.   ESRD on HD Hyponatremia Hyperkalemia - Gentle IVF NS - Consult  nephrology in AM. Will be due to HD 3/19.  Lower extremity weakness - discussed with neurosurgery in ED - MRI pending - Symptoms have improved, but not back to baseline. He does have history of cord compression so reasonable to assess MRI - Neurochecks.   Acute on chronic hypoxemic respiratory failure OSA on CPAP - Supplemental O2  L wrist pain - X-ray wrist  DM - CBG monitoring and SSI   Best practice:  Diet: Renal heart healthy Pain/Anxiety/Delirium protocol (if indicated): NA VAP protocol (if indicated): NA DVT prophylaxis: SQH GI prophylaxis: NA Glucose control: SSI Mobility: BR Code Status: FULL Family Communication: Patient updated bedside Disposition: ICU  Labs   CBC: Recent Labs  Lab 12/22/19 1949 12/22/2019 2220 12/17/2019 2245  WBC 15.7* 19.7*  --   NEUTROABS 14.5* 17.5*  --   HGB 12.2* 11.3* 12.9*  HCT 38.6* 35.4* 38.0*  MCV 93.9 92.2  --   PLT 270 250  --     Basic Metabolic Panel: Recent Labs  Lab 12/22/19 1949 12/20/2019 2220 01/06/2020 2245  NA 125* 125* 124*  K 6.1* 5.6* 5.4*  CL 84* 86*  --   CO2 22 17*  --   GLUCOSE 117* 137*  --   BUN 62* 62*  --   CREATININE 12.00* 10.21*  --   CALCIUM 8.6* 8.3*  --    GFR: Estimated Creatinine Clearance: 10.3 mL/min (A) (by C-G formula based on SCr of 10.21 mg/dL (H)). Recent Labs  Lab 12/22/19 1949 12/17/2019 2220 12/11/2019 2244 12/27/19 0020  WBC 15.7* 19.7*  --   --   LATICACIDVEN 1.6  --  4.1* 2.4*    Liver Function Tests: Recent Labs  Lab 12/22/19 1949 12/17/2019 2220  AST 15 52*  ALT 12 31  ALKPHOS 150* 213*  BILITOT 1.0 1.1  PROT 8.2* 7.4  ALBUMIN 3.0* 2.2*   No results for input(s): LIPASE, AMYLASE in the last 168 hours. No results for input(s): AMMONIA in the last 168 hours.  ABG    Component Value Date/Time   PHART 7.152 (LL) 08/23/2019 0910   PCO2ART 70.4 (HH) 08/23/2019 0910   PO2ART 80.0 (L) 08/23/2019 0910   HCO3 21.2 12/25/2019 2245   TCO2 22 01/06/2020 2245    ACIDBASEDEF 5.0 (H) 12/16/2019 2245   O2SAT 88.0 01/02/2020 2245     Coagulation Profile: Recent Labs  Lab 12/27/19 0010  INR 1.4*    Cardiac Enzymes: No results for input(s): CKTOTAL, CKMB, CKMBINDEX, TROPONINI in the last 168 hours.  HbA1C: Hemoglobin A1C  Date/Time Value Ref Range Status  08/15/2019 02:41 PM 7.0 (A) 4.0 - 5.6 % Final  04/29/2019 03:32 PM 9.1 (A) 4.0 - 5.6 % Final  01/16/2017 12:00 AM 8.1  Final   Hgb A1c MFr Bld  Date/Time Value Ref Range Status  07/03/2015 02:55 PM 8.0 (H) 4.8 - 5.6 % Final    Comment:    (NOTE)         Pre-diabetes: 5.7 - 6.4         Diabetes: >6.4         Glycemic control for  adults with diabetes: <7.0   04/30/2012 12:33 PM 5.8 (H) <5.7 % Final    Comment:    (NOTE)                                                                       According to the ADA Clinical Practice Recommendations for 2011, when HbA1c is used as a screening test:  >=6.5%   Diagnostic of Diabetes Mellitus           (if abnormal result is confirmed) 5.7-6.4%   Increased risk of developing Diabetes Mellitus References:Diagnosis and Classification of Diabetes Mellitus,Diabetes VFIE,3329,51(OACZY 1):S62-S69 and Standards of Medical Care in         Diabetes - 2011,Diabetes SAYT,0160,10 (Suppl 1):S11-S61.    CBG: No results for input(s): GLUCAP in the last 168 hours.  Review of Systems:   Bolds are positive  Constitutional: weight loss, gain, night sweats, Fevers, chills, fatigue .  HEENT: headaches, Sore throat, sneezing, nasal congestion, post nasal drip, Difficulty swallowing, Tooth/dental problems, visual complaints visual changes, ear ache CV:  chest pain, radiates:,Orthopnea, PND, swelling in lower extremities, dizziness, palpitations, syncope.  GI  heartburn, indigestion, abdominal pain, nausea, vomiting, diarrhea, change in bowel habits, loss of appetite, bloody stools.  Resp: cough, productive:, hemoptysis, dyspnea, chest pain, pleuritic.  Skin:  rash or itching or icterus GU: dysuria, change in color of urine, urgency or frequency. flank pain, hematuria  MS: back pain or swelling. decreased range of motion  Psych: change in mood or affect. depression or anxiety.  Neuro: difficulty with speech, weakness, numbness, ataxia   Past Medical History  He,  has a past medical history of Acute on chronic respiratory failure with hypoxia and hypercapnia ([place]) (05/29/2019), Diabetes mellitus, Dialysis patient [facility]), Discitis (07/2015), ESRD (end stage renal disease) on dialysis [facility]), Hypertension, RETINAL DETACHMENT, HX OF (06/20/2007), Sleep apnea, and Type 2 diabetes mellitus ([place]).   Surgical History    Past Surgical History:  Procedure Laterality Date  . A/V FISTULAGRAM N/A 10/18/2018   Procedure: A/V FISTULAGRAM;  Surgeon: Marty Heck, MD;  Location: [place] [facility];  Service: Cardiovascular;  Laterality: N/A;  . A/V FISTULAGRAM Left 03/28/2019   Procedure: A/V Fistulagram;  Surgeon: Marty Heck, MD;  Location: [place] [facility];  Service: Cardiovascular;  Laterality: Left;  . AV FISTULA PLACEMENT  05/03/2012   Procedure: ARTERIOVENOUS (AV) FISTULA CREATION;  Surgeon: Mal [name], MD;  Location: [place];  Service: Vascular;  Laterality: Right;  . AV FISTULA PLACEMENT Left 10/23/2018   Procedure: ARTERIOVENOUS (AV) FISTULA CREATION;  Surgeon: Waynetta Sandy, MD;  Location: [place];  Service: Vascular;  Laterality: Left;  . BASCILIC VEIN TRANSPOSITION Left 04/02/2019   Procedure: BASILIC VEIN TRANSPOSITION SECOND STAGE LEFT;  Surgeon: Waynetta Sandy, MD;  Location: [place];  Service: Vascular;  Laterality: Left;  . INSERTION OF DIALYSIS CATHETER Left 12/03/2018   Procedure: INSERTION OF DIALYSIS CATHETER;  Surgeon: Marty Heck, MD;  Location: [place];  Service: Vascular;  Laterality: Left;  . INSERTION OF DIALYSIS CATHETER Left 06/03/2019   Procedure: INSERTION OF DIALYSIS CATHETER;  Surgeon: Marty Heck, MD;  Location: [place];  Service: Vascular;  Laterality: Left;  . ORIF MANDIBULAR FRACTURE  N/A 11/10/2017   Procedure: OPEN REDUCTION INTERNAL FIXATION (ORIF) MANDIBULAR FRACTURE POSSIBLE MAXILLARY MANDIBULAR FIXATION;  Surgeon: Jodi Marble, MD;  Location: [place];  Service: ENT;  Laterality: N/A;  . REVISON OF ARTERIOVENOUS FISTULA Right 05/31/2018   Procedure: REVISION OF ARTERIOVENOUS FISTULA  RIGHT ARM WITH INTERPOSITION ARTEGRAFT 6MM X 16CM GRAFT;  Surgeon: Waynetta Sandy, MD;  Location: [place];  Service: Vascular;  Laterality: Right;  . TEE WITHOUT CARDIOVERSION N/A 07/10/2015   Procedure: TRANSESOPHAGEAL ECHOCARDIOGRAM (TEE);  Surgeon: Josue Hector, MD;  Location: [place];  Service: Cardiovascular;  Laterality: N/A;  . TEE WITHOUT CARDIOVERSION N/A 12/07/2018   Procedure: TRANSESOPHAGEAL ECHOCARDIOGRAM (TEE);  Surgeon: Acie Fredrickson Wonda Cheng, MD;  Location: [place];  Service: Cardiovascular;  Laterality: N/A;  . TEE WITHOUT CARDIOVERSION N/A 06/03/2019   Procedure: Transesophageal Echocardiogram (Tee);  Surgeon: Acie Fredrickson Wonda Cheng, MD;  Location: [place];  Service: Cardiovascular;  Laterality: N/A;  . tracheostomy removal    . TRACHEOSTOMY TUBE PLACEMENT N/A 08/20/2013   Procedure: TRACHEOSTOMY Revision;  Surgeon: Melida Quitter, MD;  Location: [place];  Service: ENT;  Laterality: N/A;  . UPPER EXTREMITY VENOGRAPHY Bilateral 03/28/2019   Procedure: UPPER EXTREMITY VENOGRAPHY;  Surgeon: Marty Heck, MD;  Location: [place] [facility];  Service: Cardiovascular;  Laterality: Bilateral;     Social History   reports that he has never smoked. He has never used smokeless tobacco. He reports previous drug use. Drug: Marijuana. He reports that he does not drink alcohol.   Family History   His family history includes Cancer in his mother; Diabetes in his brother, mother, and sister.   Allergies No Known Allergies   Home Medications  Prior to Admission medications     Medication Sig Start Date End Date Taking? Authorizing Provider  amLODipine (NORVASC) 10 MG tablet Take 1 tablet (10 mg total) by mouth daily. 08/21/19   Mitzi Hansen, MD  aspirin EC 81 MG tablet Take 81 mg by mouth daily.    [provider]  atorvastatin (LIPITOR) 40 MG tablet Take 1 tablet (40 mg total) by mouth daily at 6 PM. 11/26/19   Darrick Meigs, Rylee, MD  doxycycline (ADOXA) 100 MG tablet Take 1 tablet (100 mg total) by mouth 2 (two) times daily. Patient not taking: Reported on 12/22/2019 10/15/19   Ina Homes, MD  ferric citrate (AURYXIA) 1 GM 210 MG(Fe) tablet Take 420-840 mg by mouth See admin instructions. Take 4 tablets (840 mg) by mouth with meals and 2 tablets (420 mg) with snacks    [provider]  insulin degludec (TRESIBA FLEXTOUCH) 100 UNIT/ML SOPN FlexTouch Pen Inject 0.14 mLs (14 Units total) into the skin daily. 11/01/19   Mitzi Hansen, MD  Semaglutide,0.25 or 0.5MG /DOS, (OZEMPIC, 0.25 OR 0.5 MG/DOSE,) 2 MG/1.5ML SOPN Inject 0.25 mg into the skin once a week. 09/17/19   Ina Homes, MD  sertraline (ZOLOFT) 50 MG tablet Take 1 tablet (50 mg total) by mouth daily. Patient not taking: Reported on 12/22/2019 11/26/19   Mitzi Hansen, MD  traMADol (ULTRAM) 50 MG tablet Take 1 tablet (50 mg total) by mouth 2 (two) times daily. 11/06/19   Meredith Staggers, MD     Critical care time: 45 min     Georgann Housekeeper, AGACNP-BC [place] for personal pager PCCM on call pager [phone]  12/27/2019 2:29 AM

## 2019-12-27 NOTE — Consult Note (Signed)
[place] for Infectious Disease       Reason for Consult: MRSA bacteremia    Referring Physician: Dr. Tamala Julian  Active Problems:   DM (diabetes mellitus) type 2, uncontrolled, with ketoacidosis ([place])   Septic shock ([place])   . chlorhexidine      . heparin  5,000 Units Subcutaneous Q8H  . insulin aspart  0-20 Units Subcutaneous TID WC  . lidocaine      . sodium zirconium cyclosilicate  10 g Oral BID    Recommendations: Vancomycin Agree with catheter removal  repeat blood cultures TEE  Assessment: He has MRSA bacteremia and requiring pressor support.  Possible line infection and now out.  CT chest with possible septic emboli.  TTE did not show any vegetation.    Antibiotics: Vancomycin and piperacillin/tazobactam.    HPI: Seth [name] is a 54 y.o. male with ESRD on IHD, history of MSSA bacteremia last year who presented with AMS and hypotension overnight.  Now with positive blood cultures as above.  Some back pain but unable to complete MRI due to claustrophobia. Wrist pain but CT without concerns.  Tunneled catheter removed.  Chest CT with pulmonary emboli/multifocal bilateral nodularity.  No fever.  + leukocytosis.  Feels better now.     Review of Systems:  Constitutional: negative for fevers, chills and anorexia Gastrointestinal: negative for diarrhea Integument/breast: negative for rash All other systems reviewed and are negative    Past Medical History:  Diagnosis Date  . Acute on chronic respiratory failure with hypoxia and hypercapnia ([place]) 05/29/2019  . Diabetes mellitus    INSULIN DEPENDENT DIABETES  . Dialysis patient ([place])   . 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 ESRD (end stage renal disease) on dialysis [facility])    [facility] M/W/F  . Hypertension   . 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 Sleep apnea    USES CPAP  . Type 2 diabetes mellitus ([name])     Social History   Tobacco Use  .  Smoking status: Never Smoker  . Smokeless tobacco: Never Used  Substance Use Topics  . Alcohol use: No  . Drug use: Not Currently    Types: Marijuana    Family History  Problem Relation Age of Onset  . Diabetes Mother   . Cancer Mother   . Diabetes Sister   . Diabetes Brother     No Known Allergies  Physical Exam: Constitutional: in no apparent distress  Vitals:   12/27/19 1340 12/27/19 1400  BP: 104/66 (!) 107/56  Pulse:    Resp: (!) 22 19  Temp:    SpO2:     EYES: anicteric Cardiovascular: Cor RRR Respiratory: clear; GI: Bowel sounds are normal, liver is not enlarged, spleen is not enlarged Musculoskeletal: no pedal edema noted Skin: negatives: no rash Neuro: non-focal  Lab Results  Component Value Date   WBC 22.8 (H) 12/27/2019   HGB 10.9 (L) 12/27/2019   HCT 34.2 (L) 12/27/2019   MCV 92.2 12/27/2019   PLT 241 12/27/2019    Lab Results  Component Value Date   CREATININE 9.93 (H) 12/27/2019   BUN 62 (H) 12/27/2019   NA 128 (L) 12/27/2019   K 5.3 (H) 12/27/2019   CL 89 (L) 12/27/2019   CO2 17 (L) 12/27/2019    Lab Results  Component Value Date   ALT 31 12/28/2019   AST 52 (H) 12/30/2019   ALKPHOS 213 (H)  12/25/2019     Microbiology: Recent Results (from the past 240 hour(s))  SARS CORONAVIRUS 2 (TAT 6-24 HRS) Nasopharyngeal Nasopharyngeal Swab     Status: None   Collection Time: 12/22/19 11:45 PM   Specimen: Nasopharyngeal Swab  Result Value Ref Range Status   SARS Coronavirus 2 NEGATIVE NEGATIVE Final    Comment: (NOTE) SARS-CoV-2 target nucleic acids are NOT DETECTED. The SARS-CoV-2 RNA is generally detectable in upper and lower respiratory specimens during the acute phase of infection. Negative results do not preclude SARS-CoV-2 infection, do not rule out co-infections with other pathogens, and should not be used as the sole basis for treatment or other patient management decisions. Negative results must be combined with clinical  observations, patient history, and epidemiological information. The expected result is Negative. Fact Sheet for Patients: SugarRoll.be Fact Sheet for Healthcare Providers: https://www.woods-mathews.com/ This test is not yet approved or cleared by the [place] FDA and  has been authorized for detection and/or diagnosis of SARS-CoV-2 by FDA under an Emergency Use Authorization (EUA). This EUA will remain  in effect (meaning this test can be used) for the duration of the COVID-19 declaration under Section 56 4(b)(1) of the Act, 21 U.S.C. section 360bbb-3(b)(1), unless the authorization is terminated or revoked sooner. Performed at [facility], [place] [address] [place], [place] [address]   Blood Culture (routine x 2)     Status: None (Preliminary result)   Collection Time: 12/09/2019 10:15 PM   Specimen: BLOOD RIGHT ARM  Result Value Ref Range Status   Specimen Description BLOOD RIGHT ARM  Final   Special Requests   Final    BOTTLES DRAWN AEROBIC AND ANAEROBIC Blood Culture results may not be optimal due to an inadequate volume of blood received in culture bottles   Culture  Setup Time   Final    GRAM POSITIVE COCCI IN CLUSTERS IN BOTH AEROBIC AND ANAEROBIC BOTTLES CRITICAL RESULT CALLED TO, READ BACK BY AND VERIFIED WITH: J. Roselie Skinner, AT 1009 12/27/19 DV Performed at [facility], [place] [address], [place], [place] [address]    Culture GRAM POSITIVE COCCI  Final   Report Status PENDING  Incomplete  Blood Culture (routine x 2)     Status: None (Preliminary result)   Collection Time: 01/08/2020 10:41 PM   Specimen: BLOOD RIGHT HAND  Result Value Ref Range Status   Specimen Description BLOOD RIGHT HAND  Final   Special Requests AEROBIC BOTTLE ONLY Blood Culture adequate volume  Final   Culture  Setup Time   Final    GRAM POSITIVE COCCI IN CLUSTERS AEROBIC BOTTLE ONLY Organism ID to follow CRITICAL VALUE NOTED.  VALUE IS  CONSISTENT WITH PREVIOUSLY REPORTED AND CALLED VALUE. Performed at [facility], [place] [address], [place],  [address]    Culture PENDING  Incomplete   Report Status PENDING  Incomplete  Blood Culture ID Panel (Reflexed)     Status: Abnormal   Collection Time: 12/24/2019 10:41 PM  Result Value Ref Range Status   Enterococcus species NOT DETECTED NOT DETECTED Final   Listeria monocytogenes NOT DETECTED NOT DETECTED Final   Staphylococcus species DETECTED (A) NOT DETECTED Final    Comment: CRITICAL RESULT CALLED TO, READ BACK BY AND VERIFIED WITH: Andres Shad PharmD 14:30 12/27/19 ([email or web address])    Staphylococcus aureus (BCID) DETECTED (A) NOT DETECTED Final    Comment: Methicillin (oxacillin)-resistant Staphylococcus aureus (MRSA). MRSA is predictably resistant to beta-lactam antibiotics (except ceftaroline). Preferred therapy is vancomycin  unless clinically contraindicated. Patient requires contact precautions if  hospitalized. CRITICAL RESULT CALLED TO, READ BACK BY AND VERIFIED WITH: Andres Shad PharmD 14:30 12/27/19 ([email or web address])    Methicillin resistance DETECTED (A) NOT DETECTED Final    Comment: CRITICAL RESULT CALLED TO, READ BACK BY AND VERIFIED WITH: Andres Shad PharmD 14:30 12/27/19 ([email or web address])    Streptococcus species NOT DETECTED NOT DETECTED Final   Streptococcus agalactiae NOT DETECTED NOT DETECTED Final   Streptococcus pneumoniae NOT DETECTED NOT DETECTED Final   Streptococcus pyogenes NOT DETECTED NOT DETECTED Final   Acinetobacter baumannii NOT DETECTED NOT DETECTED Final   Enterobacteriaceae species NOT DETECTED NOT DETECTED Final   Enterobacter cloacae complex NOT DETECTED NOT DETECTED Final   Escherichia coli NOT DETECTED NOT DETECTED Final   Klebsiella oxytoca NOT DETECTED NOT DETECTED Final   Klebsiella pneumoniae NOT DETECTED NOT DETECTED Final   Proteus species NOT DETECTED NOT DETECTED Final   Serratia marcescens NOT DETECTED NOT DETECTED Final   Haemophilus influenzae  NOT DETECTED NOT DETECTED Final   Neisseria meningitidis NOT DETECTED NOT DETECTED Final   Pseudomonas aeruginosa NOT DETECTED NOT DETECTED Final   Candida albicans NOT DETECTED NOT DETECTED Final   Candida glabrata NOT DETECTED NOT DETECTED Final   Candida krusei NOT DETECTED NOT DETECTED Final   Candida parapsilosis NOT DETECTED NOT DETECTED Final   Candida tropicalis NOT DETECTED NOT DETECTED Final    Comment: Performed at [facility], [place]. [address], [place], [place] [address]  Respiratory Panel by RT PCR (Flu A&B, Covid) - Nasopharyngeal Swab     Status: None   Collection Time: 12/24/2019 11:22 PM   Specimen: Nasopharyngeal Swab  Result Value Ref Range Status   SARS Coronavirus 2 by RT PCR NEGATIVE NEGATIVE Final    Comment: (NOTE) SARS-CoV-2 target nucleic acids are NOT DETECTED. The SARS-CoV-2 RNA is generally detectable in upper respiratoy specimens during the acute phase of infection. The lowest concentration of SARS-CoV-2 viral copies this assay can detect is 131 copies/mL. A negative result does not preclude SARS-Cov-2 infection and should not be used as the sole basis for treatment or other patient management decisions. A negative result may occur with  improper specimen collection/handling, submission of specimen other than nasopharyngeal swab, presence of viral mutation(s) within the areas targeted by this assay, and inadequate number of viral copies (<131 copies/mL). A negative result must be combined with clinical observations, patient history, and epidemiological information. The expected result is Negative. Fact Sheet for Patients:  PinkCheek.be Fact Sheet for Healthcare Providers:  GravelBags.it This test is not yet ap proved or cleared by the [place] FDA and  has been authorized for detection and/or diagnosis of SARS-CoV-2 by FDA under an Emergency Use Authorization (EUA). This EUA will remain    in effect (meaning this test can be used) for the duration of the COVID-19 declaration under Section 564(b)(1) of the Act, 21 U.S.C. section 360bbb-3(b)(1), unless the authorization is terminated or revoked sooner.    Influenza A by PCR NEGATIVE NEGATIVE Final   Influenza B by PCR NEGATIVE NEGATIVE Final    Comment: (NOTE) The Xpert Xpress SARS-CoV-2/FLU/RSV assay is intended as an aid in  the diagnosis of influenza from Nasopharyngeal swab specimens and  should not be used as a sole basis for treatment. Nasal washings and  aspirates are unacceptable for Xpert Xpress SARS-CoV-2/FLU/RSV  testing. Fact Sheet for Patients: PinkCheek.be Fact Sheet for Healthcare Providers: GravelBags.it This test is not yet approved or cleared by the  [place] Architectural technologist and  has been authorized for detection and/or diagnosis of SARS-CoV-2 by  FDA under an Print production planner (EUA). This EUA will remain  in effect (meaning this test can be used) for the duration of the  Covid-19 declaration under Section 564(b)(1) of the Act, 21  U.S.C. section 360bbb-3(b)(1), unless the authorization is  terminated or revoked. Performed at [facility], [place] [address], [place], [place] [address]     Damian Hofstra W Tarry Blayney, [place] for Infectious Disease [facility] [email or web address] 12/27/2019, 3:09 PM

## 2019-12-27 NOTE — Progress Notes (Signed)
Pt came to MRI for scan.  Pt expressed that he was extremely claustrophobic but because of pt's low BP no sedation could be given.  Pt stated he would try but we were only able to acquire Sagital images of the Thoracic spine only, pt squeezed the alarm ball and refused further imaging.  Pt will most likely need general anesthesia to get these images completed.  What was acquired was sent in to pacs for interpretation by the radiologist.

## 2019-12-27 NOTE — Progress Notes (Signed)
Pharmacy Antibiotic Note  Truman Aceituno is a 54 y.o. male admitted on 12/20/2019 with septic shock d/t HD catheter infection +/- septic emboli in lungs.  Pharmacy has been consulted for vancomycin and Zosyn dosing.  Plan: Vancomycin 2500mg  given in ED then 1000mg  IV every HD.  Goal pre-HD level 15-25 mcg/mL. Zosyn 3.375g given in ED then 2.25g IV Q8H.   Temp (24hrs), Avg:98.7 F (37.1 C), Min:98.7 F (37.1 C), Max:98.7 F (37.1 C)  Recent Labs  Lab 12/22/19 1949 12/31/2019 2220 01/02/2020 2244 12/27/19 0020  WBC 15.7* 19.7*  --   --   CREATININE 12.00* 10.21*  --   --   LATICACIDVEN 1.6  --  4.1* 2.4*    Estimated Creatinine Clearance: 10.3 mL/min (A) (by C-G formula based on SCr of 10.21 mg/dL (H)).    No Known Allergies   Thank you for allowing pharmacy to be a part of this patient's care.  Wynona Neat, PharmD, BCPS  12/27/2019 2:38 AM

## 2019-12-27 NOTE — Progress Notes (Signed)
PHARMACY - PHYSICIAN COMMUNICATION CRITICAL VALUE ALERT - BLOOD CULTURE IDENTIFICATION (BCID)  Seth [name] is an 54 y.o. 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 Tamala Julian from [facility] paged.  Dr. Linus Salmons from [facility] paged who is already aware of the patient  Current antibiotics: Vancomycin  Changes to prescribed antibiotics recommended: Continue vancomycin   Results for orders placed or performed during the hospital encounter of 12/25/2019  Blood Culture ID Panel (Reflexed) (Collected: 01/05/2020 10:41 PM)  Result Value Ref Range   Enterococcus species NOT DETECTED NOT DETECTED   Listeria monocytogenes NOT DETECTED NOT DETECTED   Staphylococcus species DETECTED (A) NOT DETECTED   Staphylococcus aureus (BCID) DETECTED (A) NOT DETECTED   Methicillin resistance DETECTED (A) NOT DETECTED   Streptococcus species NOT DETECTED NOT DETECTED   Streptococcus agalactiae NOT DETECTED NOT DETECTED   Streptococcus pneumoniae NOT DETECTED NOT DETECTED   Streptococcus pyogenes NOT DETECTED NOT DETECTED   Acinetobacter baumannii NOT DETECTED NOT DETECTED   Enterobacteriaceae species NOT DETECTED NOT DETECTED   Enterobacter cloacae complex NOT DETECTED NOT DETECTED   Escherichia coli NOT DETECTED NOT DETECTED   Klebsiella oxytoca NOT DETECTED NOT DETECTED   Klebsiella pneumoniae NOT DETECTED NOT DETECTED   Proteus species NOT DETECTED NOT DETECTED   Serratia marcescens NOT DETECTED NOT DETECTED   Haemophilus influenzae NOT DETECTED NOT DETECTED   Neisseria meningitidis NOT DETECTED NOT DETECTED   Pseudomonas aeruginosa NOT DETECTED NOT DETECTED   Candida albicans NOT DETECTED NOT DETECTED   Candida glabrata NOT DETECTED NOT DETECTED   Candida krusei NOT DETECTED NOT DETECTED   Candida parapsilosis NOT DETECTED NOT DETECTED   Candida tropicalis NOT DETECTED  NOT DETECTED    [name] 12/27/2019  2:33 PM

## 2019-12-27 NOTE — Consult Note (Addendum)
[facility]  HISTORY AND PHYSICAL  Seth [name] is an 54 y.o. male.    Chief Complaint: AMS and hypotension  HPI: Pt is a 54M with a PMH sig for HTN, HLD, DM II, ESRD on HD [facility]  presents with AMS and hypotension.  Pt had COVID vaccine recently on Monday and was feeling malaise and a headache afterward- thought it was from the vaccine.  He went to ED 3/14 and was discharged. Came back overnight with AMS and hypotension.  Found to have MRSA bacteremia.  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  Source thought to be [facility] for HD access, had it removed.  Started on appropriate abx and pressors, now off pressors.  In this setting we are asked to see.  Pt reports he has a mature AVF which was just started to be used.  Has used it 6x and is on 16 g needles.  He says it's been going well.  Other complaints at this time include back pain and L wrist pain.  He says he's missed 2 HD sessions.  Been on HD for about 4 years.  He was cleared to use his AVF 06/2019.        PMH: Past Medical History:  Diagnosis Date  . Acute on chronic respiratory failure with hypoxia and hypercapnia ([place]) 05/29/2019  . Diabetes mellitus    INSULIN DEPENDENT DIABETES  . Dialysis patient ([place])   . 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 ESRD (end stage renal disease) on dialysis [facility])    [facility] M/W/F  . Hypertension   . 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 Sleep apnea    USES CPAP  . Type 2 diabetes mellitus ([name])    PSH: Past Surgical History:  Procedure Laterality Date  . A/V FISTULAGRAM N/A 10/18/2018   Procedure: A/V FISTULAGRAM;  Surgeon: Marty Heck, MD;  Location: [place] [facility];  Service: Cardiovascular;  Laterality: N/A;  . A/V FISTULAGRAM Left 03/28/2019   Procedure: A/V Fistulagram;  Surgeon: Marty Heck, MD;  Location: [place] [facility];  Service:  Cardiovascular;  Laterality: Left;  . AV FISTULA PLACEMENT  05/03/2012   Procedure: ARTERIOVENOUS (AV) FISTULA CREATION;  Surgeon: Mal [name], MD;  Location: [place];  Service: Vascular;  Laterality: Right;  . AV FISTULA PLACEMENT Left 10/23/2018   Procedure: ARTERIOVENOUS (AV) FISTULA CREATION;  Surgeon: Waynetta [place], MD;  Location: [place];  Service: Vascular;  Laterality: Left;  . BASCILIC VEIN TRANSPOSITION Left 04/02/2019   Procedure: BASILIC VEIN TRANSPOSITION SECOND STAGE LEFT;  Surgeon: Waynetta [place], MD;  Location: [place];  Service: Vascular;  Laterality: Left;  . INSERTION OF DIALYSIS CATHETER Left 12/03/2018   Procedure: INSERTION OF DIALYSIS CATHETER;  Surgeon: Marty Heck, MD;  Location: [place];  Service: Vascular;  Laterality: Left;  . INSERTION OF DIALYSIS CATHETER Left 06/03/2019   Procedure: INSERTION OF DIALYSIS CATHETER;  Surgeon: Marty Heck, MD;  Location: [facility] OR;  Service: Vascular;  Laterality: Left;  . IR REMOVAL TUN CV CATH W/O FL  12/27/2019  . ORIF MANDIBULAR FRACTURE N/A 11/10/2017   Procedure: OPEN REDUCTION INTERNAL FIXATION (ORIF) MANDIBULAR FRACTURE POSSIBLE MAXILLARY MANDIBULAR FIXATION;  Surgeon: Jodi Marble, MD;  Location: [place];  Service: ENT;  Laterality: N/A;  . REVISON OF ARTERIOVENOUS FISTULA Right 05/31/2018   Procedure: REVISION  OF ARTERIOVENOUS FISTULA  RIGHT ARM WITH INTERPOSITION ARTEGRAFT 6MM X 16CM GRAFT;  Surgeon: Waynetta [place], MD;  Location: [place];  Service: Vascular;  Laterality: Right;  . TEE WITHOUT CARDIOVERSION N/A 07/10/2015   Procedure: TRANSESOPHAGEAL ECHOCARDIOGRAM (TEE);  Surgeon: Josue Hector, MD;  Location: [place];  Service: Cardiovascular;  Laterality: N/A;  . TEE WITHOUT CARDIOVERSION N/A 12/07/2018   Procedure: TRANSESOPHAGEAL ECHOCARDIOGRAM (TEE);  Surgeon: Acie Fredrickson Wonda Cheng, MD;  Location: [place];  Service: Cardiovascular;  Laterality: N/A;  . TEE WITHOUT CARDIOVERSION N/A  06/03/2019   Procedure: Transesophageal Echocardiogram (Tee);  Surgeon: Acie Fredrickson Wonda Cheng, MD;  Location: [place];  Service: Cardiovascular;  Laterality: N/A;  . tracheostomy removal    . TRACHEOSTOMY TUBE PLACEMENT N/A 08/20/2013   Procedure: TRACHEOSTOMY Revision;  Surgeon: Melida Quitter, MD;  Location: [place];  Service: ENT;  Laterality: N/A;  . UPPER EXTREMITY VENOGRAPHY Bilateral 03/28/2019   Procedure: UPPER EXTREMITY VENOGRAPHY;  Surgeon: Marty Heck, MD;  Location: [place] [facility];  Service: Cardiovascular;  Laterality: Bilateral;     Past Medical History:  Diagnosis Date  . Acute on chronic respiratory failure with hypoxia and hypercapnia ([place]) 05/29/2019  . Diabetes mellitus    INSULIN DEPENDENT DIABETES  . Dialysis patient ([place])   . 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 ESRD (end stage renal disease) on dialysis [facility])    [facility] M/W/F  . Hypertension   . 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 Sleep apnea    USES CPAP  . Type 2 diabetes mellitus ([name])     Medications:  Reviewed in Epic   ALLERGIES:  No Known Allergies  FAM HX: Family History  Problem Relation Age of Onset  . Diabetes Mother   . Cancer Mother   . Diabetes Sister   . Diabetes Brother     Social History:   reports that he has never smoked. He has never used smokeless tobacco. He reports previous drug use. Drug: Marijuana. He reports that he does not drink alcohol.  ROS: ROS: all other systems reviewed and are negative  Blood pressure (!) 99/43, pulse 75, temperature 98.7 F (37.1 C), temperature source Temporal, resp. rate 15, SpO2 94 %. PHYSICAL EXAM: Physical Exam  GEN NAD, sitting in bed HEENT EOMI PERRL NECK no JVD CHEST: TDC site dressed PULM clear bilaterally no c/w/r CV RRR ABD obese EXT L wrist hot/ red/swollen/tender NEURO AAO x 3 no asterixis SKIN no rashes MSK L wrist as above ACCESS: LUE AVF +T/B   Results for  orders placed or performed during the hospital encounter of 12/18/2019 (from the past 48 hour(s))  Blood Culture (routine x 2)     Status: None (Preliminary result)   Collection Time: 01/06/2020 10:15 PM   Specimen: BLOOD RIGHT ARM  Result Value Ref Range   Specimen Description BLOOD RIGHT ARM    Special Requests      BOTTLES DRAWN AEROBIC AND ANAEROBIC Blood Culture results may not be optimal due to an inadequate volume of blood received in culture bottles   Culture  Setup Time      GRAM POSITIVE COCCI IN CLUSTERS IN BOTH AEROBIC AND ANAEROBIC BOTTLES CRITICAL RESULT CALLED TO, READ BACK BY AND VERIFIED WITH: J. Roselie Skinner, AT 1009 12/27/19 DV Performed at [facility], [place] [address] [place], [place] [address]    Culture GRAM POSITIVE COCCI    Report Status  PENDING   Comprehensive metabolic panel     Status: Abnormal   Collection Time: 12/14/2019 10:20 PM  Result Value Ref Range   Sodium 125 (L) 135 - 145 mmol/L   Potassium 5.6 (H) 3.5 - 5.1 mmol/L   Chloride 86 (L) 98 - 111 mmol/L   CO2 17 (L) 22 - 32 mmol/L   Glucose, Bld 137 (H) 70 - 99 mg/dL    Comment: Glucose reference range applies only to samples taken after fasting for at least 8 hours.   BUN 62 (H) 6 - 20 mg/dL   Creatinine, Ser 10.21 (H) 0.61 - 1.24 mg/dL   Calcium 8.3 (L) 8.9 - 10.3 mg/dL   Total Protein 7.4 6.5 - 8.1 g/dL   Albumin 2.2 (L) 3.5 - 5.0 g/dL   AST 52 (H) 15 - 41 U/L   ALT 31 0 - 44 U/L   Alkaline Phosphatase 213 (H) 38 - 126 U/L   Total Bilirubin 1.1 0.3 - 1.2 mg/dL   GFR calc non Af Amer 5 (L) >60 mL/min   GFR calc Af Amer 6 (L) >60 mL/min   Anion gap 22 (H) 5 - 15    Comment: Performed at [facility], [place] [address], [place], [place] [address]  CBC WITH DIFFERENTIAL     Status: Abnormal   Collection Time: 12/14/2019 10:20 PM  Result Value Ref Range   WBC 19.7 (H) 4.0 - 10.5 K/uL   RBC 3.84 (L) 4.22 - 5.81 MIL/uL   Hemoglobin 11.3 (L) 13.0 - 17.0 g/dL   HCT 35.4 (L) 39.0 - 52.0 %   MCV  92.2 80.0 - 100.0 fL   MCH 29.4 26.0 - 34.0 pg   MCHC 31.9 30.0 - 36.0 g/dL   RDW 15.6 (H) 11.5 - 15.5 %   Platelets 250 150 - 400 K/uL   nRBC 0.0 0.0 - 0.2 %   Neutrophils Relative % 89 %   Neutro Abs 17.5 (H) 1.7 - 7.7 K/uL   Lymphocytes Relative 6 %   Lymphs Abs 1.2 0.7 - 4.0 K/uL   Monocytes Relative 3 %   Monocytes Absolute 0.6 0.1 - 1.0 K/uL   Eosinophils Relative 0 %   Eosinophils Absolute 0.0 0.0 - 0.5 K/uL   Basophils Relative 2 %   Basophils Absolute 0.4 (H) 0.0 - 0.1 K/uL   nRBC 0 0 /100 WBC   Abs Immature Granulocytes 0.00 0.00 - 0.07 K/uL   Polychromasia PRESENT     Comment: Performed at [facility], [place] [address] [place], [place] [address]  Blood Culture (routine x 2)     Status: None (Preliminary result)   Collection Time: 12/14/2019 10:41 PM   Specimen: BLOOD RIGHT HAND  Result Value Ref Range   Specimen Description BLOOD RIGHT HAND    Special Requests AEROBIC BOTTLE ONLY Blood Culture adequate volume    Culture  Setup Time      GRAM POSITIVE COCCI IN CLUSTERS AEROBIC BOTTLE ONLY Organism ID to follow CRITICAL VALUE NOTED.  VALUE IS CONSISTENT WITH PREVIOUSLY REPORTED AND CALLED VALUE. Performed at [facility], [place] [address], [place], [place] [address]    Culture PENDING    Report Status PENDING   Blood Culture ID Panel (Reflexed)     Status: Abnormal   Collection Time: 12/27/2019 10:41 PM  Result Value Ref Range   Enterococcus species NOT DETECTED NOT DETECTED   Listeria monocytogenes NOT DETECTED NOT DETECTED   Staphylococcus species  DETECTED (A) NOT DETECTED    Comment: CRITICAL RESULT CALLED TO, READ BACK BY AND VERIFIED WITH: Andres Shad PharmD 14:30 12/27/19 ([email or web address])    Staphylococcus aureus (BCID) DETECTED (A) NOT DETECTED    Comment: Methicillin (oxacillin)-resistant Staphylococcus aureus (MRSA). MRSA is predictably resistant to beta-lactam antibiotics (except ceftaroline). Preferred therapy is vancomycin unless clinically  contraindicated. Patient requires contact precautions if  hospitalized. CRITICAL RESULT CALLED TO, READ BACK BY AND VERIFIED WITH: Andres Shad PharmD 14:30 12/27/19 ([email or web address])    Methicillin resistance DETECTED (A) NOT DETECTED    Comment: CRITICAL RESULT CALLED TO, READ BACK BY AND VERIFIED WITH: Andres Shad PharmD 14:30 12/27/19 ([email or web address])    Streptococcus species NOT DETECTED NOT DETECTED   Streptococcus agalactiae NOT DETECTED NOT DETECTED   Streptococcus pneumoniae NOT DETECTED NOT DETECTED   Streptococcus pyogenes NOT DETECTED NOT DETECTED   Acinetobacter baumannii NOT DETECTED NOT DETECTED   Enterobacteriaceae species NOT DETECTED NOT DETECTED   Enterobacter cloacae complex NOT DETECTED NOT DETECTED   Escherichia coli NOT DETECTED NOT DETECTED   Klebsiella oxytoca NOT DETECTED NOT DETECTED   Klebsiella pneumoniae NOT DETECTED NOT DETECTED   Proteus species NOT DETECTED NOT DETECTED   Serratia marcescens NOT DETECTED NOT DETECTED   Haemophilus influenzae NOT DETECTED NOT DETECTED   Neisseria meningitidis NOT DETECTED NOT DETECTED   Pseudomonas aeruginosa NOT DETECTED NOT DETECTED   Candida albicans NOT DETECTED NOT DETECTED   Candida glabrata NOT DETECTED NOT DETECTED   Candida krusei NOT DETECTED NOT DETECTED   Candida parapsilosis NOT DETECTED NOT DETECTED   Candida tropicalis NOT DETECTED NOT DETECTED    Comment: Performed at [facility], [address], [place], [place] [address]  Lactic acid, plasma     Status: Abnormal   Collection Time: 12/17/2019 10:44 PM  Result Value Ref Range   Lactic Acid, Venous 4.1 (HH) 0.5 - 1.9 mmol/L    Comment: CRITICAL RESULT CALLED TO, READ BACK BY AND VERIFIED WITH: RN K GIBSON @2351  12/12/2019 BY S GEZAHEGN Performed at [facility], [place] [address], [place], [place] [address]   POCT I-Stat EG7     Status: Abnormal   Collection Time: 12/25/2019 10:45 PM  Result Value Ref Range   pH, Ven 7.315 7.250 - 7.430   pCO2, Ven 41.6 (L) 44.0 -  60.0 mmHg   pO2, Ven 59.0 (H) 32.0 - 45.0 mmHg   Bicarbonate 21.2 20.0 - 28.0 mmol/L   TCO2 22 22 - 32 mmol/L   O2 Saturation 88.0 %   Acid-base deficit 5.0 (H) 0.0 - 2.0 mmol/L   Sodium 124 (L) 135 - 145 mmol/L   Potassium 5.4 (H) 3.5 - 5.1 mmol/L   Calcium, Ion 1.01 (L) 1.15 - 1.40 mmol/L   HCT 38.0 (L) 39.0 - 52.0 %   Hemoglobin 12.9 (L) 13.0 - 17.0 g/dL   Patient temperature HIDE    Sample type VENOUS   Respiratory Panel by RT PCR (Flu A&B, Covid) - Nasopharyngeal Swab     Status: None   Collection Time: 12/30/2019 11:22 PM   Specimen: Nasopharyngeal Swab  Result Value Ref Range   SARS Coronavirus 2 by RT PCR NEGATIVE NEGATIVE    Comment: (NOTE) SARS-CoV-2 target nucleic acids are NOT DETECTED. The SARS-CoV-2 RNA is generally detectable in upper respiratoy specimens during the acute phase of infection. The lowest concentration of SARS-CoV-2 viral copies this assay can detect is 131 copies/mL. A negative result does not preclude SARS-Cov-2 infection and should not be  used as the sole basis for treatment or other patient management decisions. A negative result may occur with  improper specimen collection/handling, submission of specimen other than nasopharyngeal swab, presence of viral mutation(s) within the areas targeted by this assay, and inadequate number of viral copies (<131 copies/mL). A negative result must be combined with clinical observations, patient history, and epidemiological information. The expected result is Negative. Fact Sheet for Patients:  PinkCheek.be Fact Sheet for Healthcare Providers:  GravelBags.it This test is not yet ap proved or cleared by the [place] FDA and  has been authorized for detection and/or diagnosis of SARS-CoV-2 by FDA under an Emergency Use Authorization (EUA). This EUA will remain  in effect (meaning this test can be used) for the duration of the COVID-19 declaration  under Section 564(b)(1) of the Act, 21 U.S.C. section 360bbb-3(b)(1), unless the authorization is terminated or revoked sooner.    Influenza A by PCR NEGATIVE NEGATIVE   Influenza B by PCR NEGATIVE NEGATIVE    Comment: (NOTE) The Xpert Xpress SARS-CoV-2/FLU/RSV assay is intended as an aid in  the diagnosis of influenza from Nasopharyngeal swab specimens and  should not be used as a sole basis for treatment. Nasal washings and  aspirates are unacceptable for Xpert Xpress SARS-CoV-2/FLU/RSV  testing. Fact Sheet for Patients: PinkCheek.be Fact Sheet for Healthcare Providers: GravelBags.it This test is not yet approved or cleared by the [place] FDA and  has been authorized for detection and/or diagnosis of SARS-CoV-2 by  FDA under an Emergency Use Authorization (EUA). This EUA will remain  in effect (meaning this test can be used) for the duration of the  Covid-19 declaration under Section 564(b)(1) of the Act, 21  U.S.C. section 360bbb-3(b)(1), unless the authorization is  terminated or revoked. Performed at [facility], [place] [address], [place], [place] [address]   Protime-INR     Status: Abnormal   Collection Time: 12/27/19 12:10 AM  Result Value Ref Range   Prothrombin Time 16.8 (H) 11.4 - 15.2 seconds   INR 1.4 (H) 0.8 - 1.2    Comment: (NOTE) INR goal varies based on device and disease states. Performed at [facility], [place] [address] [place], [place] [address]   APTT     Status: Abnormal   Collection Time: 12/27/19 12:10 AM  Result Value Ref Range   aPTT 42 (H) 24 - 36 seconds    Comment:        IF BASELINE aPTT IS ELEVATED, SUGGEST PATIENT RISK ASSESSMENT BE USED TO DETERMINE APPROPRIATE ANTICOAGULANT THERAPY. Performed at [facility], [place] [address] [place], [place] [address]   Lactic acid, plasma     Status: Abnormal   Collection Time: 12/27/19 12:20 AM  Result Value Ref Range    Lactic Acid, Venous 2.4 (HH) 0.5 - 1.9 mmol/L    Comment: CRITICAL VALUE NOTED.  VALUE IS CONSISTENT WITH PREVIOUSLY REPORTED AND CALLED VALUE. Performed at [facility], [place] [address], [place], [place] [address]   Hemoglobin A1c     Status: Abnormal   Collection Time: 12/27/19  4:34 AM  Result Value Ref Range   Hgb A1c MFr Bld 7.1 (H) 4.8 - 5.6 %    Comment: (NOTE) Pre diabetes:          5.7%-6.4% Diabetes:              >6.4% Glycemic control for   <7.0% adults with diabetes    Mean Plasma Glucose 157.07  mg/dL    Comment: Performed at [facility], [place] [address] [place], [place] [address]  Cortisol     Status: None   Collection Time: 12/27/19  4:34 AM  Result Value Ref Range   Cortisol, Plasma 37.6 ug/dL    Comment: (NOTE) AM    6.7 - 22.6 ug/dL PM   <10.0       ug/dL Performed at [facility] [address] [place], [place] [address]   Procalcitonin     Status: None   Collection Time: 12/27/19  4:34 AM  Result Value Ref Range   Procalcitonin >150.00 ng/mL    Comment:        Interpretation: PCT >= 10 ng/mL: Important systemic inflammatory response, almost exclusively due to severe bacterial sepsis or septic shock. (NOTE)       Sepsis PCT Algorithm           Lower Respiratory Tract                                      Infection PCT Algorithm    ----------------------------     ----------------------------         PCT < 0.25 ng/mL                PCT < 0.10 ng/mL         Strongly encourage             Strongly discourage   discontinuation of antibiotics    initiation of antibiotics    ----------------------------     -----------------------------       PCT 0.25 - 0.50 ng/mL            PCT 0.10 - 0.25 ng/mL               OR       >80% decrease in PCT            Discourage initiation of                                            antibiotics      Encourage discontinuation           of antibiotics    ----------------------------      -----------------------------         PCT >= 0.50 ng/mL              PCT 0.26 - 0.50 ng/mL                AND       <80% decrease in PCT             Encourage initiation of                                             antibiotics       Encourage continuation           of antibiotics    ----------------------------     -----------------------------        PCT >= 0.50 ng/mL                  PCT >  0.50 ng/mL               AND         increase in PCT                  Strongly encourage                                      initiation of antibiotics    Strongly encourage escalation           of antibiotics                                     -----------------------------                                           PCT <= 0.25 ng/mL                                                 OR                                        > 80% decrease in PCT                                     Discontinue / Do not initiate                                             antibiotics Performed at [facility], [address], [place], [place] [address]   CBC     Status: Abnormal   Collection Time: 12/27/19  4:34 AM  Result Value Ref Range   WBC 22.8 (H) 4.0 - 10.5 K/uL   RBC 3.71 (L) 4.22 - 5.81 MIL/uL   Hemoglobin 10.9 (L) 13.0 - 17.0 g/dL   HCT 34.2 (L) 39.0 - 52.0 %   MCV 92.2 80.0 - 100.0 fL   MCH 29.4 26.0 - 34.0 pg   MCHC 31.9 30.0 - 36.0 g/dL   RDW 15.8 (H) 11.5 - 15.5 %   Platelets 241 150 - 400 K/uL   nRBC 0.0 0.0 - 0.2 %    Comment: Performed at [facility], [place] [address] [place], [place] [address]  Basic metabolic panel     Status: Abnormal   Collection Time: 12/27/19  4:34 AM  Result Value Ref Range   Sodium 128 (L) 135 - 145 mmol/L   Potassium 5.3 (H) 3.5 - 5.1 mmol/L   Chloride 89 (L) 98 - 111 mmol/L   CO2 17 (L) 22 - 32 mmol/L   Glucose, Bld 115 (H) 70 - 99 mg/dL    Comment: Glucose reference range applies only to samples taken after fasting for at least 8 hours.   BUN 62  (H) 6 -  20 mg/dL   Creatinine, Ser 9.93 (H) 0.61 - 1.24 mg/dL   Calcium 8.2 (L) 8.9 - 10.3 mg/dL   GFR calc non Af Amer 5 (L) >60 mL/min   GFR calc Af Amer 6 (L) >60 mL/min   Anion gap 22 (H) 5 - 15    Comment: Performed at [facility] [address], [place], [place] [address]  Magnesium     Status: Abnormal   Collection Time: 12/27/19  4:34 AM  Result Value Ref Range   Magnesium 1.6 (L) 1.7 - 2.4 mg/dL    Comment: Performed at [facility] [address] [place], [place] [address]  Phosphorus     Status: Abnormal   Collection Time: 12/27/19  4:34 AM  Result Value Ref Range   Phosphorus 6.0 (H) 2.5 - 4.6 mg/dL    Comment: Performed at [facility] [address] [place], [place] [address]  CBG monitoring, ED     Status: Abnormal   Collection Time: 12/27/19  9:00 AM  Result Value Ref Range   Glucose-Capillary 129 (H) 70 - 99 mg/dL    Comment: Glucose reference range applies only to samples taken after fasting for at least 8 hours.  CBG monitoring, ED     Status: Abnormal   Collection Time: 12/27/19  1:22 PM  Result Value Ref Range   Glucose-Capillary 136 (H) 70 - 99 mg/dL    Comment: Glucose reference range applies only to samples taken after fasting for at least 8 hours.   Comment 1 Notify RN    Comment 2 Document in Chart     DG Wrist 2 Views Left  Result Date: 12/27/2019 CLINICAL DATA:  Left wrist pain EXAM: LEFT WRIST - 2 VIEW COMPARISON:  None. FINDINGS: There is flattening of the lunate. There is increased lucency at the base of the capitate and hamate. There is diffuse mild-to-moderate soft tissue swelling at the wrist and proximal hand. IMPRESSION: Increased lucency at the base of the capitate and hamate, likely chronic degenerative or posttraumatic change. Electronically Signed   By: Ulyses Jarred M.D.   On: 12/27/2019 03:09   CT Angio Chest PE W/Cm &/Or Wo Cm  Result Date: 12/20/2019 CLINICAL DATA:  Dyspnea. Concern for pulmonary embolism. Recent  COVID vaccination. EXAM: CT ANGIOGRAPHY CHEST WITH CONTRAST TECHNIQUE: Multidetector CT imaging of the chest was performed using the standard protocol during bolus administration of intravenous contrast. Multiplanar CT image reconstructions and MIPs were obtained to evaluate the vascular anatomy. Automatic exposure control utilized. CONTRAST:  170mL OMNIPAQUE IOHEXOL 350 MG/ML SOLN COMPARISON:  None. FINDINGS: Cardiovascular: No central pulmonary embolism. Contrast bolus timing and patient respiratory motion preclude evaluation of the more peripheral pulmonary arteries; a pulmonary embolism in these vessels is not excluded on the current study. Mild cardiomegaly. Pulmonary artery hypertension, the pulmonary trunk measuring 3.4 cm diameter. A right internal jugular central vascular catheter, its tip at the superior vena cava atrial junction. Mediastinum/Nodes: Small nonspecific lymph nodes. Reflux of contrast material into the right shoulder and mediastinal vasculature. Normal appearance of the nondistended esophagus and imaged portion of the thyroid. Lungs/Pleura: No pleural effusion. Or pulmonary edema. Several a peripherally distributed pulmonary nodularities, the larger with central cavitation in both upper lobes, and in the left lower lobe. The dominantly sized cavitary lesion measures 35 by 25 mm in the lingula contiguous with the pleural surface and cardiac apex. No lobar consolidation. Upper Abdomen: A 2 cm water-density left renal midpole 1.5  cm water-density right renal upper pole exophytic cortical cyst, likely benign; no further imaging characterization is indicated. Nonobstructing bilateral renal calculi. Mild pancreatic atrophy. Musculoskeletal: Remote upper thoracic posterior spinal stabilization from T1 to T7 with bilateral vertical rods and transpedicular screws. The diffuse mild degenerative change. Residual 13 mm lucency in the coapted T4-5 vertebral bodies in the region of remote osteomyelitis  discitis present on the August 10, 2015 thoracic spine CT imaging. Stable 9 mm lytic lesion in the T10 vertebral body with imaging characteristics typical of a benign hemangioma. Smaller lytic lesions in the T11, T12 and L1 vertebra are new or increased in size is compared to 2016 and may be degenerative. Review of the MIP images confirms the above findings. IMPRESSION: No central pulmonary embolism. Contrast bolus timing and patient respiratory motion preclude exclusion of a more peripheral pulmonary embolism on the current study. Multifocal bilateral pulmonary peripheral nodularities, the larger with central cavitation measuring up to 35 x 25 mm in the lingula. This appearance could be secondary to peripheral pulmonary emboli versus other thromboembolic or an infectious or less likely metastatic or malignant etiology. Unenhanced CT chest recommended in 2-3 months to document response to therapy and resolution. 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 Residual or recurrence infection is a differential consideration. Gallium scintigraphy could be considered. Stable T10 lytic lesion, likely a benign hemangioma. T11-L1 smaller lytic lesions, possibly degenerative. Electronically Signed   By: Revonda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

## 2019-12-27 NOTE — Progress Notes (Signed)
PHARMACY - PHYSICIAN COMMUNICATION CRITICAL VALUE ALERT - BLOOD CULTURE IDENTIFICATION (BCID)  Seth [name] is an 54 y.o. male who presented to [facility] on 12/13/2019 with a chief complaint of weakness and back pain, possible infected HD catheter  Assessment:  One positive blood culture for GPC in clusters, no BCID performed.  History of MSSA bacteremia in August.  MRSA PCR positive in November.  Name of physician (or Provider) Contacted: [name]  Current antibiotics: Vancomycin + Zosyn  Changes to prescribed antibiotics recommended:  Patient is on recommended antibiotics - No changes needed  [name] 12/27/2019  10:24 AM

## 2019-12-27 NOTE — Progress Notes (Signed)
Off pressors now with catheter out. Should be okay for stepdown. Appreciate ID recommendations. Will call nephro as well.  Erskine Emery MD PCCM

## 2019-12-27 NOTE — Progress Notes (Signed)
eLink Physician-Brief Progress Note Patient Name: Seth [name] DOB: 09-Mar-1966 MRN: [id number]   Date of Service  12/27/2019  HPI/Events of Note  ER nurse called. Pt asking for tylenol for left wrist pain.   eICU Interventions  Ordered Tylenol 650mg  PO Q4H PRN pain/fever.     Intervention Category Minor Interventions: Routine modifications to care plan (e.g. PRN medications for pain, fever)  Marily Lente Petra Dumler 12/27/2019, 6:08 AM

## 2019-12-27 NOTE — ED Notes (Signed)
ICU  Breakfast ordered

## 2019-12-27 NOTE — Progress Notes (Signed)
  Echocardiogram 2D Echocardiogram has been performed.  Seth [name] 12/27/2019, 10:00 AM

## 2019-12-27 NOTE — Progress Notes (Signed)
Seen and examined. Likely MRSA in blood. Echo pending. Looks clinically well. On small amount of levophed. Exquisite pain in left wrist, probably infected CT left wrist Will ask IR to remove tunneled cath and have ID weigh in.  May need temporary HD line as early as tomorrow depending on labs, add some lokelma.  Erskine Emery MD PCCM

## 2019-12-27 NOTE — Sepsis Progress Note (Addendum)
Notified provider of need to document reason to not give fluid bolus,due pulmonary edema, pt got only 214ml bolus.

## 2019-12-28 DIAGNOSIS — R7881 Bacteremia: Secondary | ICD-10-CM | POA: Diagnosis not present

## 2019-12-28 DIAGNOSIS — N186 End stage renal disease: Secondary | ICD-10-CM | POA: Diagnosis not present

## 2019-12-28 DIAGNOSIS — T827XXD Infection and inflammatory reaction due to other cardiac and vascular devices, implants and grafts, subsequent encounter: Secondary | ICD-10-CM

## 2019-12-28 DIAGNOSIS — M25532 Pain in left wrist: Secondary | ICD-10-CM

## 2019-12-28 DIAGNOSIS — A419 Sepsis, unspecified organism: Secondary | ICD-10-CM | POA: Diagnosis not present

## 2019-12-28 DIAGNOSIS — E1110 Type 2 diabetes mellitus with ketoacidosis without coma: Secondary | ICD-10-CM

## 2019-12-28 DIAGNOSIS — E871 Hypo-osmolality and hyponatremia: Secondary | ICD-10-CM

## 2019-12-28 DIAGNOSIS — E875 Hyperkalemia: Secondary | ICD-10-CM

## 2019-12-28 LAB — CBC WITH DIFFERENTIAL/PLATELET
Abs Immature Granulocytes: 0 10*3/uL (ref 0.00–0.07)
Basophils Absolute: 0 10*3/uL (ref 0.0–0.1)
Basophils Relative: 0 %
Eosinophils Absolute: 0 10*3/uL (ref 0.0–0.5)
Eosinophils Relative: 0 %
HCT: 32.2 % — ABNORMAL LOW (ref 39.0–52.0)
Hemoglobin: 10.7 g/dL — ABNORMAL LOW (ref 13.0–17.0)
Lymphocytes Relative: 6 %
Lymphs Abs: 1.1 10*3/uL (ref 0.7–4.0)
MCH: 29 pg (ref 26.0–34.0)
MCHC: 33.2 g/dL (ref 30.0–36.0)
MCV: 87.3 fL (ref 80.0–100.0)
Monocytes Absolute: 1.8 10*3/uL — ABNORMAL HIGH (ref 0.1–1.0)
Monocytes Relative: 10 %
Neutro Abs: 15.1 10*3/uL — ABNORMAL HIGH (ref 1.7–7.7)
Neutrophils Relative %: 84 %
Platelets: 273 10*3/uL (ref 150–400)
RBC: 3.69 MIL/uL — ABNORMAL LOW (ref 4.22–5.81)
RDW: 15.8 % — ABNORMAL HIGH (ref 11.5–15.5)
WBC: 18 10*3/uL — ABNORMAL HIGH (ref 4.0–10.5)
nRBC: 0 /100 WBC
nRBC: 0.1 % (ref 0.0–0.2)

## 2019-12-28 LAB — GLUCOSE, CAPILLARY
Glucose-Capillary: 109 mg/dL — ABNORMAL HIGH (ref 70–99)
Glucose-Capillary: 134 mg/dL — ABNORMAL HIGH (ref 70–99)
Glucose-Capillary: 129 mg/dL — ABNORMAL HIGH (ref 70–99)

## 2019-12-28 LAB — RENAL FUNCTION PANEL
Albumin: 1.8 g/dL — ABNORMAL LOW (ref 3.5–5.0)
Anion gap: 19 — ABNORMAL HIGH (ref 5–15)
BUN: 81 mg/dL — ABNORMAL HIGH (ref 6–20)
CO2: 19 mmol/L — ABNORMAL LOW (ref 22–32)
Calcium: 7.6 mg/dL — ABNORMAL LOW (ref 8.9–10.3)
Chloride: 87 mmol/L — ABNORMAL LOW (ref 98–111)
Creatinine, Ser: 10.97 mg/dL — ABNORMAL HIGH (ref 0.61–1.24)
GFR calc Af Amer: 5 mL/min — ABNORMAL LOW (ref >60)
GFR calc non Af Amer: 5 mL/min — ABNORMAL LOW (ref >60)
Glucose, Bld: 114 mg/dL — ABNORMAL HIGH (ref 70–99)
Phosphorus: 7 mg/dL — ABNORMAL HIGH (ref 2.5–4.6)
Potassium: 6.3 mmol/L (ref 3.5–5.1)
Sodium: 125 mmol/L — ABNORMAL LOW (ref 135–145)

## 2019-12-28 LAB — MAGNESIUM: Magnesium: 2 mg/dL (ref 1.7–2.4)

## 2019-12-28 LAB — VANCOMYCIN, RANDOM: Vancomycin Rm: 25

## 2019-12-28 LAB — MRSA PCR SCREENING: MRSA by PCR: POSITIVE — AB

## 2019-12-28 MED ORDER — HEPARIN SODIUM (PORCINE) 1000 UNIT/ML IJ SOLN
INTRAMUSCULAR | Status: AC
  Administered 2019-12-28: 5000 [IU] via INTRAVENOUS_CENTRAL
  Filled 2019-12-28: qty 5

## 2019-12-28 MED ORDER — MIDODRINE HCL 5 MG PO TABS
10.0000 mg | ORAL_TABLET | Freq: Three times a day (TID) | ORAL | Status: DC
  Administered 2019-12-28 – 2019-12-29 (×4): 10 mg via ORAL
  Filled 2019-12-28 (×4): qty 2

## 2019-12-28 MED ORDER — LORAZEPAM 2 MG/ML IJ SOLN
1.0000 mg | Freq: Once | INTRAMUSCULAR | Status: AC
  Administered 2019-12-29: 1 mg via INTRAVENOUS
  Filled 2019-12-28: qty 1

## 2019-12-28 MED ORDER — MIDODRINE HCL 5 MG PO TABS
ORAL_TABLET | ORAL | Status: AC
  Filled 2019-12-28: qty 2

## 2019-12-28 NOTE — Progress Notes (Signed)
PHARMACY - PHYSICIAN COMMUNICATION CRITICAL VALUE ALERT - BLOOD CULTURE IDENTIFICATION (BCID)  Seth [name] is an 54 y.o. male who presented to [facility] on 12/20/2019 with a chief complaint of sepsis from bacteremia.  Assessment: One pediatric bottle positive for GPC in clusters. Patient had 2/2 bottles on 3/18 positive for staph aureus, MICs pending. MRSA screen positive and already on vancomycin. Likely source is HD catheter.   Name of physician (or Provider) Contacted: X. Blount, NP  Current antibiotics: vancomycin  Changes to prescribed antibiotics recommended:  Patient is on recommended antibiotics - No changes needed  Results for orders placed or performed during the hospital encounter of 12/12/2019  Blood Culture ID Panel (Reflexed) (Collected: 12/30/2019 10:41 PM)  Result Value Ref Range   Enterococcus species NOT DETECTED NOT DETECTED   Listeria monocytogenes NOT DETECTED NOT DETECTED   Staphylococcus species DETECTED (A) NOT DETECTED   Staphylococcus aureus (BCID) DETECTED (A) NOT DETECTED   Methicillin resistance DETECTED (A) NOT DETECTED   Streptococcus species NOT DETECTED NOT DETECTED   Streptococcus agalactiae NOT DETECTED NOT DETECTED   Streptococcus pneumoniae NOT DETECTED NOT DETECTED   Streptococcus pyogenes NOT DETECTED NOT DETECTED   Acinetobacter baumannii NOT DETECTED NOT DETECTED   Enterobacteriaceae species NOT DETECTED NOT DETECTED   Enterobacter cloacae complex NOT DETECTED NOT DETECTED   Escherichia coli NOT DETECTED NOT DETECTED   Klebsiella oxytoca NOT DETECTED NOT DETECTED   Klebsiella pneumoniae NOT DETECTED NOT DETECTED   Proteus species NOT DETECTED NOT DETECTED   Serratia marcescens NOT DETECTED NOT DETECTED   Haemophilus influenzae NOT DETECTED NOT DETECTED   Neisseria meningitidis NOT DETECTED NOT DETECTED   Pseudomonas aeruginosa NOT DETECTED NOT DETECTED   Candida albicans NOT DETECTED NOT DETECTED   Candida glabrata NOT DETECTED  NOT DETECTED   Candida krusei NOT DETECTED NOT DETECTED   Candida parapsilosis NOT DETECTED NOT DETECTED   Candida tropicalis NOT DETECTED NOT DETECTED    Benetta Spar, PharmD, BCPS, BCCP Clinical Pharmacist  Please check AMION for all [place] phone numbers After 10:00 PM, call [place] [phone]

## 2019-12-28 NOTE — Plan of Care (Signed)
Plan of care reviewed with pt at bedside. VSS, BP soft, on stepdown monitor. Pt pain in arms and hands bilaterally. Gauze over left HD catheter site. Pt off floor for HD.  Education provided. Call bell in reach, will continue to monitor pt.   Problem: Education: Goal: Knowledge of General Education information will improve Description: Including pain rating scale, medication(s)/side effects and non-pharmacologic comfort measures Outcome: Progressing   Problem: Health Behavior/Discharge Planning: Goal: Ability to manage health-related needs will improve Outcome: Progressing   Problem: Clinical Measurements: Goal: Ability to maintain clinical measurements within normal limits will improve Outcome: Progressing Goal: Will remain free from infection Outcome: Progressing Goal: Diagnostic test results will improve Outcome: Progressing Goal: Respiratory complications will improve Outcome: Progressing Goal: Cardiovascular complication will be avoided Outcome: Progressing   Problem: Activity: Goal: Risk for activity intolerance will decrease Outcome: Progressing   Problem: Nutrition: Goal: Adequate nutrition will be maintained Outcome: Progressing   Problem: Coping: Goal: Level of anxiety will decrease Outcome: Progressing   Problem: Elimination: Goal: Will not experience complications related to bowel motility Outcome: Progressing Goal: Will not experience complications related to urinary retention Outcome: Progressing   Problem: Pain Managment: Goal: General experience of comfort will improve Outcome: Progressing   Problem: Safety: Goal: Ability to remain free from injury will improve Outcome: Progressing   Problem: Skin Integrity: Goal: Risk for impaired skin integrity will decrease Outcome: Progressing   Problem: Fluid Volume: Goal: Hemodynamic stability will improve Outcome: Progressing   Problem: Clinical Measurements: Goal: Diagnostic test results will  improve Outcome: Progressing Goal: Signs and symptoms of infection will decrease Outcome: Progressing   Problem: Respiratory: Goal: Ability to maintain adequate ventilation will improve Outcome: Progressing

## 2019-12-28 NOTE — Progress Notes (Signed)
Pharmacy Antibiotic Note  Seth [name] is a 54 y.o. male admitted on 12/24/2019 with MRSA bacteremia. Likely source was [facility] catheter, which has been removed. Pharmacy consulted for vancomycin dosing.  Patient is usually on dialysis MWF but got off scheduled and was dialyzed today. Patient received 2 doses vancomycin before HD today. Random vancomycin level ~5hr post-HD was 25 (goal 15-25). Planning next HD on Monday.   Plan: Continue vancomycin 1000mg  IV post-HD. Goal pre-HD level 15-25 mcg/mL.  Monitor culture MIC, clinical status, HD schedule F/u duration    Temp (24hrs), Avg:97.9 F (36.6 C), Min:97.6 F (36.4 C), Max:98.1 F (36.7 C)  Recent Labs  Lab 12/22/19 1949 12/25/2019 2220 12/18/2019 2244 12/27/19 0020 12/27/19 0434 12/27/19 2253 12/28/19 0818 12/28/19 1942  WBC 15.7* 19.7*  --   --  22.8* 13.5* 18.0*  --   CREATININE 12.00* 10.21*  --   --  9.93* 10.50* 10.97*  --   LATICACIDVEN 1.6  --  4.1* 2.4*  --   --   --   --   VANCORANDOM  --   --   --   --   --   --   --  25    Estimated Creatinine Clearance: 10 mL/min (A) (by C-G formula based on SCr of 10.97 mg/dL (H)).    Antimicrobials Vancomycin 3/18>> Pip/tazo 3/19  Microbiology 3/18 Bcx 2/2: MRSA  3/19 MRSA +  3/20 Bcx 1/1: GPC clusters   Benetta Spar, PharmD, BCPS, [facility] Clinical Pharmacist  Please check AMION for all [place] phone numbers After 10:00 PM, call [place] [phone]

## 2019-12-28 NOTE — Progress Notes (Signed)
PROGRESS NOTE    Seth [name]  ZOX:[id number] DOB: Jul 17, 1966 DOA: 12/30/2019 PCP: Mitzi Hansen, MD    Brief Narrative: HPI per Dr. Halford Chessman 54 year old male with PMH as below, which is significant for ESRD on HD, chronic hypoxemic respiratory failure no home O2, OSA on CPAP, DM, discitis, and cord compression myelopathy. Most recent HD was 3/17. He left the session 45 mins early due to back pain. He was in his usual state of health until approximately 3/11 when he developed muscle aches and back pain. He had just received his 1st COVID-19 vaccine a few days prior and it was felt these symptoms were secondary to this. He has been seen twice in the ED in the past week. First for complaints of febrile illness. He was not given any antibiotics at that time. He then returned 3/18 with complaints of myalgias. He returned to ED later in the day 3/18 via EMS after being found to have altered mental status at home. Upon arrival to ED he was noted to be hypotensive with SBP in the 60s and hypoxemic with O2 sats in the 70s on room air. Improved with supplemental O2 and levophed infusion after 254m bolus. There were also concerns he had been unable to use his legs the past few days and MRI spine was ordered. PCCM asked to evaluate for admission due to need for vasoactive infusion.   Assessment & Plan:   Principal Problem:   Septic shock ([facility] Active Problems:   MRSA bacteremia   Hyperlipidemia associated with type 2 diabetes mellitus ([facility]   ESRD (end stage renal disease) on dialysis ([name])   Absolute anemia   DM (diabetes mellitus) type 2, uncontrolled, with ketoacidosis ([facility]   Hyperkalemia   Hyponatremia   Sepsis due to infected intravenous catheter ([facility]  #1 septic shock secondary to MRSA bacteremia secondary to infected HD catheter/ Patient had presented noted to be in septic shock requiring pressors felt secondary to infected HD catheter.  Infected HD catheter removed 12/27/2019 with  some purulence noted.  Patient currently off pressors.  Systolic blood pressure low 90s this morning.  MRSA PCR positive.  Blood cultures positive for MRSA.  2D echo negative for any vegetations/endocarditis.  Patient currently in hemodialysis.  Continue IV vancomycin.  ID following and appreciate their input and recommendations.  2.  Hyperkalemia/hyponatremia/acidosis/hyperphosphatemia Secondary to end-stage renal disease.  Status post Lokelma.  Patient in hemodialysis.  Per nephrology.  3.  Lower extremity weakness Patient unable to get MRI completed, due to claustrophobia.  PT/OT.  4.  Left wrist pain Plain films of the left wrist negative for any acute abnormalities.  Supportive care.  Follow.  5.  Diabetes mellitus II Hemoglobin A1c of 7.1.  CBG of 109.  Sliding scale insulin.  6.  End-stage renal disease Patient being followed by nephrology.  Currently in hemodialysis.   DVT prophylaxis: Heparin Code Status: Full Family Communication: Updated patient.   Disposition Plan:  . Patient came from: Home            . Anticipated d/c place: Home . Barriers to d/c OR conditions which need to be met to effect a safe d/c: Likely home once clinically improved, improvement with hypotension/sepsis, electrolyte abnormalities corrected, cleared by ID and nephrology.   Consultants:   Patient admitted to critical care service  ID: Dr.Comer 12/27/2019  Nephrology: Dr. UHollie Salk3/19/2021  Procedures:   CT left wrist 12/27/2019  CT angiogram chest 01/02/2020  Plain films of the left  wrist 12/27/2019  Chest x-ray 12/18/2019  Incomplete MRI T and L-spine 12/27/2019--- patient could not tolerate due to claustrophobia.  2D echo 12/27/2019  Successful removal of tunneled left IJ hemodialysis catheter with purulence expressed from tract per Ascencion Dike, PA interventional radiology 12/27/2019  Antimicrobials:  IV Zosyn 12/27/2019>>>> 12/27/2019  IV vancomycin 12/12/2019   Subjective: In HD.   Alert and oriented to self place and time.  Denies any chest pain.  No shortness of breath.  States he is feeling better than yesterday.  Objective: Vitals:   12/28/19 1023 12/28/19 1036 12/28/19 1045 12/28/19 1100  BP: (!) 90/52 (!) 92/48 (!) 101/53 (!) 85/59  Pulse: 81 82 81 79  Resp: 16     Temp: 97.6 F (36.4 C)     TempSrc: Oral     SpO2: 99%     Weight: 129.8 kg     Height:        Intake/Output Summary (Last 24 hours) at 12/28/2019 1112 Last data filed at 12/28/2019 1497 Gross per 24 hour  Intake 420 ml  Output 0 ml  Net 420 ml   Filed Weights   12/27/19 2128 12/28/19 1023  Weight: 130.9 kg 129.8 kg    Examination:  General exam: Appears calm and comfortable  Respiratory system: Clear to auscultation anterior lung fields. Respiratory effort normal. Cardiovascular system: S1 & S2 heard, RRR. No JVD, murmurs, rubs, gallops or clicks. No pedal edema. Gastrointestinal system: Abdomen is nondistended, soft and nontender. No organomegaly or masses felt. Normal bowel sounds heard. Central nervous system: Alert and oriented. No focal neurological deficits. Extremities: Symmetric 5 x 5 power. Skin: No rashes, lesions or ulcers Psychiatry: Judgement and insight appear normal. Mood & affect appropriate.     Data Reviewed: I have personally reviewed following labs and imaging studies  CBC: Recent Labs  Lab 12/22/19 1949 12/22/19 1949 12/20/2019 2220 01/02/2020 2245 12/27/19 0434 12/27/19 2253 12/28/19 0818  WBC 15.7*  --  19.7*  --  22.8* 13.5* 18.0*  NEUTROABS 14.5*  --  17.5*  --   --   --  15.1*  HGB 12.2*   < > 11.3* 12.9* 10.9* 11.4* 10.7*  HCT 38.6*   < > 35.4* 38.0* 34.2* 34.8* 32.2*  MCV 93.9  --  92.2  --  92.2 89.9 87.3  PLT 270  --  250  --  241 247 273   < > = values in this interval not displayed.   Basic Metabolic Panel: Recent Labs  Lab 12/22/19 1949 12/22/19 1949 12/25/2019 2220 01/04/2020 2245 12/27/19 0434 12/27/19 2253 12/28/19 0818  NA 125*    < > 125* 124* 128* 123* 125*  K 6.1*   < > 5.6* 5.4* 5.3* 5.5* 6.3*  CL 84*  --  86*  --  89* 87* 87*  CO2 22  --  17*  --  17* 17* 19*  GLUCOSE 117*  --  137*  --  115* 120* 114*  BUN 62*  --  62*  --  62* 74* 81*  CREATININE 12.00*  --  10.21*  --  9.93* 10.50* 10.97*  CALCIUM 8.6*  --  8.3*  --  8.2* 7.6* 7.6*  MG  --   --   --   --  1.6*  --  2.0  PHOS  --   --   --   --  6.0* 6.1* 7.0*   < > = values in this interval not displayed.   GFR: Estimated Creatinine  Clearance: 10 mL/min (A) (by C-G formula based on SCr of 10.97 mg/dL (H)). Liver Function Tests: Recent Labs  Lab 12/22/19 1949 12/17/2019 2220 12/27/19 2253 12/28/19 0818  AST 15 52*  --   --   ALT 12 31  --   --   ALKPHOS 150* 213*  --   --   BILITOT 1.0 1.1  --   --   PROT 8.2* 7.4  --   --   ALBUMIN 3.0* 2.2* 1.8* 1.8*   No results for input(s): LIPASE, AMYLASE in the last 168 hours. No results for input(s): AMMONIA in the last 168 hours. Coagulation Profile: Recent Labs  Lab 12/27/19 0010  INR 1.4*   Cardiac Enzymes: No results for input(s): CKTOTAL, CKMB, CKMBINDEX, TROPONINI in the last 168 hours. BNP (last 3 results) No results for input(s): PROBNP in the last 8760 hours. HbA1C: Recent Labs    12/27/19 0434  HGBA1C 7.1*   CBG: Recent Labs  Lab 12/27/19 0900 12/27/19 1322 12/27/19 1823 12/27/19 2212 12/28/19 0833  GLUCAP 129* 136* 115* 112* 109*   Lipid Profile: No results for input(s): CHOL, HDL, LDLCALC, TRIG, CHOLHDL, LDLDIRECT in the last 72 hours. Thyroid Function Tests: No results for input(s): TSH, T4TOTAL, FREET4, T3FREE, THYROIDAB in the last 72 hours. Anemia Panel: No results for input(s): VITAMINB12, FOLATE, FERRITIN, TIBC, IRON, RETICCTPCT in the last 72 hours. Sepsis Labs: Recent Labs  Lab 12/22/19 1949 12/27/2019 2244 12/27/19 0020 12/27/19 0434  PROCALCITON  --   --   --  >150.00  LATICACIDVEN 1.6 4.1* 2.4*  --     Recent Results (from the past 240 hour(s))  SARS  CORONAVIRUS 2 (TAT 6-24 HRS) Nasopharyngeal Nasopharyngeal Swab     Status: None   Collection Time: 12/22/19 11:45 PM   Specimen: Nasopharyngeal Swab  Result Value Ref Range Status   SARS Coronavirus 2 NEGATIVE NEGATIVE Final    Comment: (NOTE) SARS-CoV-2 target nucleic acids are NOT DETECTED. The SARS-CoV-2 RNA is generally detectable in upper and lower respiratory specimens during the acute phase of infection. Negative results do not preclude SARS-CoV-2 infection, do not rule out co-infections with other pathogens, and should not be used as the sole basis for treatment or other patient management decisions. Negative results must be combined with clinical observations, patient history, and epidemiological information. The expected result is Negative. Fact Sheet for Patients: SugarRoll.be Fact Sheet for Healthcare Providers: https://www.woods-mathews.com/ This test is not yet approved or cleared by the [place] FDA and  has been authorized for detection and/or diagnosis of SARS-CoV-2 by FDA under an Emergency Use Authorization (EUA). This EUA will remain  in effect (meaning this test can be used) for the duration of the COVID-19 declaration under Section 56 4(b)(1) of the Act, 21 U.S.C. section 360bbb-3(b)(1), unless the authorization is terminated or revoked sooner. Performed at [facility], [place] [address] [place], [place] [address]   Blood Culture (routine x 2)     Status: Abnormal (Preliminary result)   Collection Time: 12/18/2019 10:15 PM   Specimen: BLOOD RIGHT ARM  Result Value Ref Range Status   Specimen Description BLOOD RIGHT ARM  Final   Special Requests   Final    BOTTLES DRAWN AEROBIC AND ANAEROBIC Blood Culture results may not be optimal due to an inadequate volume of blood received in culture bottles   Culture  Setup Time   Final    GRAM POSITIVE COCCI IN CLUSTERS IN BOTH AEROBIC AND ANAEROBIC BOTTLES CRITICAL  RESULT CALLED TO, READ BACK BY AND VERIFIED WITH: J. FRENS PHARMD, AT 1009 12/27/19 DV    Culture (A)  Final    STAPHYLOCOCCUS AUREUS SUSCEPTIBILITIES TO FOLLOW Performed at [facility], [place] [address], [place], [place] [address]    Report Status PENDING  Incomplete  Blood Culture (routine x 2)     Status: Abnormal (Preliminary result)   Collection Time: 12/31/2019 10:41 PM   Specimen: BLOOD RIGHT HAND  Result Value Ref Range Status   Specimen Description BLOOD RIGHT HAND  Final   Special Requests AEROBIC BOTTLE ONLY Blood Culture adequate volume  Final   Culture  Setup Time   Final    GRAM POSITIVE COCCI IN CLUSTERS AEROBIC BOTTLE ONLY CRITICAL VALUE NOTED.  VALUE IS CONSISTENT WITH PREVIOUSLY REPORTED AND CALLED VALUE. Performed at [facility], [place] [address], [place], [place] [address]    Culture STAPHYLOCOCCUS AUREUS (A)  Final   Report Status PENDING  Incomplete  Blood Culture ID Panel (Reflexed)     Status: Abnormal   Collection Time: 12/11/2019 10:41 PM  Result Value Ref Range Status   Enterococcus species NOT DETECTED NOT DETECTED Final   Listeria monocytogenes NOT DETECTED NOT DETECTED Final   Staphylococcus species DETECTED (A) NOT DETECTED Final    Comment: CRITICAL RESULT CALLED TO, READ BACK BY AND VERIFIED WITH: Andres Shad PharmD 14:30 12/27/19 ([email or web address])    Staphylococcus aureus (BCID) DETECTED (A) NOT DETECTED Final    Comment: Methicillin (oxacillin)-resistant Staphylococcus aureus (MRSA). MRSA is predictably resistant to beta-lactam antibiotics (except ceftaroline). Preferred therapy is vancomycin unless clinically contraindicated. Patient requires contact precautions if  hospitalized. CRITICAL RESULT CALLED TO, READ BACK BY AND VERIFIED WITH: Andres Shad PharmD 14:30 12/27/19 ([email or web address])    Methicillin resistance DETECTED (A) NOT DETECTED Final    Comment: CRITICAL RESULT CALLED TO, READ BACK BY AND VERIFIED WITH: Andres Shad PharmD 14:30 12/27/19 ([email or web address])     Streptococcus species NOT DETECTED NOT DETECTED Final   Streptococcus agalactiae NOT DETECTED NOT DETECTED Final   Streptococcus pneumoniae NOT DETECTED NOT DETECTED Final   Streptococcus pyogenes NOT DETECTED NOT DETECTED Final   Acinetobacter baumannii NOT DETECTED NOT DETECTED Final   Enterobacteriaceae species NOT DETECTED NOT DETECTED Final   Enterobacter cloacae complex NOT DETECTED NOT DETECTED Final   Escherichia coli NOT DETECTED NOT DETECTED Final   Klebsiella oxytoca NOT DETECTED NOT DETECTED Final   Klebsiella pneumoniae NOT DETECTED NOT DETECTED Final   Proteus species NOT DETECTED NOT DETECTED Final   Serratia marcescens NOT DETECTED NOT DETECTED Final   Haemophilus influenzae NOT DETECTED NOT DETECTED Final   Neisseria meningitidis NOT DETECTED NOT DETECTED Final   Pseudomonas aeruginosa NOT DETECTED NOT DETECTED Final   Candida albicans NOT DETECTED NOT DETECTED Final   Candida glabrata NOT DETECTED NOT DETECTED Final   Candida krusei NOT DETECTED NOT DETECTED Final   Candida parapsilosis NOT DETECTED NOT DETECTED Final   Candida tropicalis NOT DETECTED NOT DETECTED Final    Comment: Performed at [facility], [place]. [address] [place], [place] [address]  Respiratory Panel by RT PCR (Flu A&B, Covid) - Nasopharyngeal Swab     Status: None   Collection Time: 12/15/2019 11:22 PM   Specimen: Nasopharyngeal Swab  Result Value Ref Range Status   SARS Coronavirus 2 by RT PCR NEGATIVE NEGATIVE Final    Comment: (NOTE) SARS-CoV-2 target nucleic acids are NOT DETECTED. The SARS-CoV-2 RNA is generally detectable in upper respiratoy specimens during  the acute phase of infection. The lowest concentration of SARS-CoV-2 viral copies this assay can detect is 131 copies/mL. A negative result does not preclude SARS-Cov-2 infection and should not be used as the sole basis for treatment or other patient management decisions. A negative result may occur with  improper specimen  collection/handling, submission of specimen other than nasopharyngeal swab, presence of viral mutation(s) within the areas targeted by this assay, and inadequate number of viral copies (<131 copies/mL). A negative result must be combined with clinical observations, patient history, and epidemiological information. The expected result is Negative. Fact Sheet for Patients:  PinkCheek.be Fact Sheet for Healthcare Providers:  GravelBags.it This test is not yet ap proved or cleared by the [place] FDA and  has been authorized for detection and/or diagnosis of SARS-CoV-2 by FDA under an Emergency Use Authorization (EUA). This EUA will remain  in effect (meaning this test can be used) for the duration of the COVID-19 declaration under Section 564(b)(1) of the Act, 21 U.S.C. section 360bbb-3(b)(1), unless the authorization is terminated or revoked sooner.    Influenza A by PCR NEGATIVE NEGATIVE Final   Influenza B by PCR NEGATIVE NEGATIVE Final    Comment: (NOTE) The Xpert Xpress SARS-CoV-2/FLU/RSV assay is intended as an aid in  the diagnosis of influenza from Nasopharyngeal swab specimens and  should not be used as a sole basis for treatment. Nasal washings and  aspirates are unacceptable for Xpert Xpress SARS-CoV-2/FLU/RSV  testing. Fact Sheet for Patients: PinkCheek.be Fact Sheet for Healthcare Providers: GravelBags.it This test is not yet approved or cleared by the [place] FDA and  has been authorized for detection and/or diagnosis of SARS-CoV-2 by  FDA under an Emergency Use Authorization (EUA). This EUA will remain  in effect (meaning this test can be used) for the duration of the  Covid-19 declaration under Section 564(b)(1) of the Act, 21  U.S.C. section 360bbb-3(b)(1), unless the authorization is  terminated or revoked. Performed at [facility], [place] [address], [place], [place] [address]   MRSA PCR Screening     Status: Abnormal   Collection Time: 12/27/19  5:30 AM   Specimen: Nasopharyngeal  Result Value Ref Range Status   MRSA by PCR POSITIVE (A) NEGATIVE Final    Comment:        The GeneXpert MRSA Assay (FDA approved for NASAL specimens only), is one component of a comprehensive MRSA colonization surveillance program. It is not intended to diagnose MRSA infection nor to guide or monitor treatment for MRSA infections. RESULT CALLED TO, READ BACK BY AND VERIFIED WITH: RN Sarajane Jews [phone] FCP Performed at [facility] [address] [place], [place] [address]          Radiology Studies: DG Wrist 2 Views Left  Result Date: 12/27/2019 CLINICAL DATA:  Left wrist pain EXAM: LEFT WRIST - 2 VIEW COMPARISON:  None. FINDINGS: There is flattening of the lunate. There is increased lucency at the base of the capitate and hamate. There is diffuse mild-to-moderate soft tissue swelling at the wrist and proximal hand. IMPRESSION: Increased lucency at the base of the capitate and hamate, likely chronic degenerative or posttraumatic change. Electronically Signed   By: Ulyses Jarred M.D.   On: 12/27/2019 03:09   CT Angio Chest PE W/Cm &/Or Wo Cm  Result Date: 12/14/2019 CLINICAL DATA:  Dyspnea. Concern for pulmonary embolism. Recent COVID vaccination. EXAM: CT ANGIOGRAPHY CHEST WITH CONTRAST TECHNIQUE: Multidetector CT imaging of the chest  was performed using the standard protocol during bolus administration of intravenous contrast. Multiplanar CT image reconstructions and MIPs were obtained to evaluate the vascular anatomy. Automatic exposure control utilized. CONTRAST:  150m OMNIPAQUE IOHEXOL 350 MG/ML SOLN COMPARISON:  None. FINDINGS: Cardiovascular: No central pulmonary embolism. Contrast bolus timing and patient respiratory motion preclude evaluation of the more peripheral pulmonary arteries; a pulmonary embolism in  these vessels is not excluded on the current study. Mild cardiomegaly. Pulmonary artery hypertension, the pulmonary trunk measuring 3.4 cm diameter. A right internal jugular central vascular catheter, its tip at the superior vena cava atrial junction. Mediastinum/Nodes: Small nonspecific lymph nodes. Reflux of contrast material into the right shoulder and mediastinal vasculature. Normal appearance of the nondistended esophagus and imaged portion of the thyroid. Lungs/Pleura: No pleural effusion. Or pulmonary edema. Several a peripherally distributed pulmonary nodularities, the larger with central cavitation in both upper lobes, and in the left lower lobe. The dominantly sized cavitary lesion measures 35 by 25 mm in the lingula contiguous with the pleural surface and cardiac apex. No lobar consolidation. Upper Abdomen: A 2 cm water-density left renal midpole 1.5 cm water-density right renal upper pole exophytic cortical cyst, likely benign; no further imaging characterization is indicated. Nonobstructing bilateral renal calculi. Mild pancreatic atrophy. Musculoskeletal: Remote upper thoracic posterior spinal stabilization from T1 to T7 with bilateral vertical rods and transpedicular screws. The diffuse mild degenerative change. Residual 13 mm lucency in the coapted T4-5 vertebral bodies in the region of remote osteomyelitis discitis present on the August 10, 2015 thoracic spine CT imaging. Stable 9 mm lytic lesion in the T10 vertebral body with imaging characteristics typical of a benign hemangioma. Smaller lytic lesions in the T11, T12 and L1 vertebra are new or increased in size is compared to 2016 and may be degenerative. Review of the MIP images confirms the above findings. IMPRESSION: No central pulmonary embolism. Contrast bolus timing and patient respiratory motion preclude exclusion of a more peripheral pulmonary embolism on the current study. Multifocal bilateral pulmonary peripheral nodularities, the  larger with central cavitation measuring up to 35 x 25 mm in the lingula. This appearance could be secondary to peripheral pulmonary emboli versus other thromboembolic or an infectious or less likely metastatic or malignant etiology. Unenhanced CT chest recommended in 2-3 months to document response to therapy and resolution. 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 Residual or recurrence infection is a differential consideration. Gallium scintigraphy could be considered. Stable T10 lytic lesion, likely a benign hemangioma. T11-L1 smaller lytic lesions, possibly degenerative. Electronically Signed   By: RRevonda Humphrey  On: 12/15/2019 23:43   MR THORACIC SPINE WO CONTRAST  Result Date: 12/27/2019 CLINICAL DATA:  Back pain. Patient is claustrophobic and could not tolerate more than initial sagittal imaging. EXAM: MRI THORACIC SPINE WITHOUT CONTRAST TECHNIQUE: Multiplanar, multisequence MR imaging of the thoracic spine was performed. No intravenous contrast was administered. COMPARISON:  CT chest yesterday. FINDINGS: Kyphotic curvature in the upper thoracic region because of fracture in fusion at T4 and T5. Posterior fusion with pedicle screws and rods from T1 through T7. Very limited useful information otherwise. No sign of acute regional fracture or other acute complication. Chronic marrow space entity anteriorly at T10, not likely significant, probably a hemangioma or cyst. Inferior endplate Schmorl's node at T12. No finding to suggest any acute or unexpected process on this limited exam. IMPRESSION: The patient could only  tolerate few sagittal sequences because of claustrophobia. This is a very abbreviated study. Compared to the CT scan of yesterday, no new useful information is obtained. Previous thoracic fusion from T1 through T7. Kyphotic deformity in the upper thoracic region. No  sign of an acute process such as new fracture or obvious spinal infection. Electronically Signed   By: Nelson Chimes M.D.   On: 12/27/2019 14:12   CT WRIST LEFT W CONTRAST  Result Date: 12/27/2019 CLINICAL DATA:  Left wrist pain and swelling. Question osteomyelitis. EXAM: CT OF THE UPPER LEFT EXTREMITY WITH CONTRAST TECHNIQUE: Multidetector CT imaging of the upper left extremity was performed according to the standard protocol following intravenous contrast administration. CONTRAST:  100 mL OMNIPAQUE IOHEXOL 300 MG/ML  SOLN COMPARISON:  Plain films left wrist today. FINDINGS: Bones/Joint/Cartilage There is no bony destructive change or periosteal reaction to suggest osteomyelitis. Bone-on-bone joint space narrowing with extensive subchondral cyst formation is seen at the articulations of the capitate, hamate, lunate and triquetrum. No fracture or dislocation. No joint effusion. Ligaments Suboptimally assessed by CT. Muscles and Tendons Intact. No intramuscular fluid collection. No gas within muscle or tracking along fascial planes. Soft tissues No soft tissue gas. No abscess. No infiltration of subcutaneous fat. IMPRESSION: No acute abnormality.  No evidence of infection. 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 Electronically Signed   By: Inge Rise M.D.   On: 12/27/2019 14:09   IR Removal Tun Cv Cath W/O FL  Result Date: 12/27/2019 INDICATION: End-stage renal disease. Bacteremia. Request removal of tunneled hemodialysis catheter. Catheter originally placed by vascular service. EXAM: REMOVAL OF TUNNELED LEFT IJ HEMODIALYSIS CATHETER MEDICATIONS: None COMPLICATIONS: None immediate. PROCEDURE: Informed written consent was obtained from the patient following an explanation of the procedure, risks, benefits and alternatives to treatment. A time out was performed prior to the initiation of the procedure. Maximal barrier sterile technique was utilized  including caps, mask, sterile gowns, sterile gloves, large sterile drape, hand hygiene, and chlorhexidine. 1% lidocaine was injected under sterile conditions along the subcutaneous tunnel. Utilizing a combination of blunt dissection and gentle traction, the cuff of the catheter was exposed and the catheter was removed intact. Hemostasis was obtained with manual compression. A dressing was placed. The patient tolerated the procedure well without immediate post procedural complication. IMPRESSION: Successful removal of tunneled left IJ dialysis catheter. Read by: Ascencion Dike PA-C Electronically Signed   By: Jerilynn Mages.  Shick M.D.   On: 12/27/2019 11:27   DG Chest Port 1 View  Result Date: 12/13/2019 CLINICAL DATA:  54 year old male with shortness of breath and weakness. EXAM: PORTABLE CHEST 1 VIEW COMPARISON:  Chest radiograph dated 12/22/2019. FINDINGS: Left IJ dialysis catheter in similar position. There is mild cardiomegaly and mild vascular congestion. Bilateral mid to lower lung field hazy densities may represent atelectasis and congestion although infiltrate is not excluded. Clinical correlation is recommended. No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

## 2019-12-28 NOTE — Progress Notes (Addendum)
Received call from MRI inquiring if pt was still going forward with test after pt stopped procedure for c/o claustrophobia while in the machine. MRI wanted to know if pt was agreeable to sedation, when asked of pt he said yes. On call physician was paged and notified.  2140 New orders given for Ativan to be administered prior to pt going for MRI.  0150 Transport here to take pt to MRI, Ativan 1 mg IVP given at this time.  0255 Pt returned from MRI via bed and said he did very well during the procedure.

## 2019-12-28 NOTE — Progress Notes (Signed)
  [facility] Progress Note   Assessment/ Plan:   1. Septic shock 2/2 MRSA bacteremia: on antbiotics, off pressors now, BP still a little soft but feeling much better.  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  TDC has been removed.  Has back pain- getting MRI, couldn't tolerate.  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  2.  ESRD: MWF [place]-- missed 2 sessions, HD today and then Monday  3.  DM II: per primary  4.  Hyperkalemia: got lokelma, will correct with HD  5.  Dispo: admitted  Subjective:    Seen on HD.  Using 2 16 g needles,  BFR 300.  Got midodrine.  Doing well.     Objective:   BP (!) 85/59   Pulse 79   Temp 97.6 F (36.4 C) (Oral)   Resp 16   Ht 5\' 8"  (1.727 m)   Wt 129.8 kg   SpO2 99%   BMI 43.51 kg/m   Physical Exam: GEN NAD, sitting in bed HEENT EOMI PERRL NECK no JVD CHEST: TDC site dressed PULM clear bilaterally no c/w/r CV RRR ABD obese EXT L wrist hot/ red/swollen/tender NEURO AAO x 3 no asterixis SKIN no rashes MSK L wrist as above ACCESS: LUE AVF +T/B, small infiltration  Labs: BMET Recent Labs  Lab 12/22/19 1949 12/10/2019 2220 12/31/2019 2245 12/27/19 0434 12/27/19 2253 12/28/19 0818  NA 125* 125* 124* 128* 123* 125*  K 6.1* 5.6* 5.4* 5.3* 5.5* 6.3*  CL 84* 86*  --  89* 87* 87*  CO2 22 17*  --  17* 17* 19*  GLUCOSE 117* 137*  --  115* 120* 114*  BUN 62* 62*  --  62* 74* 81*  CREATININE 12.00* 10.21*  --  9.93* 10.50* 10.97*  CALCIUM 8.6* 8.3*  --  8.2* 7.6* 7.6*  PHOS  --   --   --  6.0* 6.1* 7.0*   CBC Recent Labs  Lab 12/22/19 1949 12/22/19 1949 12/23/2019 2220 12/19/2019 2220 12/14/2019 2245 12/27/19 0434 12/27/19 2253 12/28/19 0818  WBC 15.7*   < > 19.7*  --   --  22.8* 13.5* 18.0*  NEUTROABS 14.5*  --  17.5*  --   --   --   --  15.1*  HGB 12.2*   < > 11.3*   < > 12.9* 10.9* 11.4* 10.7*  HCT 38.6*   < > 35.4*   < >  38.0* 34.2* 34.8* 32.2*  MCV 93.9   < > 92.2  --   --  92.2 89.9 87.3  PLT 270   < > 250  --   --  241 247 273   < > = values in this interval not displayed.      Medications:    . Chlorhexidine Gluconate Cloth  6 each Topical Q0600  . heparin      . heparin  5,000 Units Subcutaneous Q8H  . heparin  5,000 Units Dialysis Once in dialysis  . insulin aspart  0-20 Units Subcutaneous TID WC  . midodrine  10 mg Oral TID WC     Madelon Lips MD 12/28/2019, 11:30 AM

## 2019-12-28 NOTE — Progress Notes (Signed)
Pt called RT for assistance with setting up his home CPAP machine. RT filled humidification reservoir with sterile water. Pt wishes to wear his [place] with his CPAP vs O2 being bled into his device. Pt home setting is min 5 max 20 on his unit. RT will continue to monitor.

## 2019-12-29 ENCOUNTER — Inpatient Hospital Stay (HOSPITAL_COMMUNITY): Payer: Medicare Other

## 2019-12-29 DIAGNOSIS — A419 Sepsis, unspecified organism: Secondary | ICD-10-CM | POA: Diagnosis not present

## 2019-12-29 DIAGNOSIS — E1110 Type 2 diabetes mellitus with ketoacidosis without coma: Secondary | ICD-10-CM | POA: Diagnosis not present

## 2019-12-29 DIAGNOSIS — R7881 Bacteremia: Secondary | ICD-10-CM | POA: Diagnosis not present

## 2019-12-29 DIAGNOSIS — N186 End stage renal disease: Secondary | ICD-10-CM | POA: Diagnosis not present

## 2019-12-29 DIAGNOSIS — G4733 Obstructive sleep apnea (adult) (pediatric): Secondary | ICD-10-CM

## 2019-12-29 DIAGNOSIS — B9562 Methicillin resistant Staphylococcus aureus infection as the cause of diseases classified elsewhere: Secondary | ICD-10-CM | POA: Diagnosis not present

## 2019-12-29 DIAGNOSIS — Z992 Dependence on renal dialysis: Secondary | ICD-10-CM | POA: Diagnosis not present

## 2019-12-29 LAB — CBC WITH DIFFERENTIAL/PLATELET
Abs Immature Granulocytes: 0.47 10*3/uL — ABNORMAL HIGH (ref 0.00–0.07)
Basophils Absolute: 0.1 10*3/uL (ref 0.0–0.1)
Basophils Relative: 1 %
Eosinophils Absolute: 0.1 10*3/uL (ref 0.0–0.5)
Eosinophils Relative: 0 %
HCT: 31.1 % — ABNORMAL LOW (ref 39.0–52.0)
Hemoglobin: 10.5 g/dL — ABNORMAL LOW (ref 13.0–17.0)
Immature Granulocytes: 2 %
Lymphocytes Relative: 6 %
Lymphs Abs: 1.1 10*3/uL (ref 0.7–4.0)
MCH: 29 pg (ref 26.0–34.0)
MCHC: 33.8 g/dL (ref 30.0–36.0)
MCV: 85.9 fL (ref 80.0–100.0)
Monocytes Absolute: 1.6 10*3/uL — ABNORMAL HIGH (ref 0.1–1.0)
Monocytes Relative: 8 %
Neutro Abs: 16.5 10*3/uL — ABNORMAL HIGH (ref 1.7–7.7)
Neutrophils Relative %: 83 %
Platelets: 289 10*3/uL (ref 150–400)
RBC: 3.62 MIL/uL — ABNORMAL LOW (ref 4.22–5.81)
RDW: 15.8 % — ABNORMAL HIGH (ref 11.5–15.5)
WBC: 19.9 10*3/uL — ABNORMAL HIGH (ref 4.0–10.5)
nRBC: 0.3 % — ABNORMAL HIGH (ref 0.0–0.2)

## 2019-12-29 LAB — GLUCOSE, CAPILLARY
Glucose-Capillary: 134 mg/dL — ABNORMAL HIGH (ref 70–99)
Glucose-Capillary: 149 mg/dL — ABNORMAL HIGH (ref 70–99)
Glucose-Capillary: 142 mg/dL — ABNORMAL HIGH (ref 70–99)
Glucose-Capillary: 143 mg/dL — ABNORMAL HIGH (ref 70–99)

## 2019-12-29 LAB — RENAL FUNCTION PANEL
Albumin: 1.7 g/dL — ABNORMAL LOW (ref 3.5–5.0)
Anion gap: 19 — ABNORMAL HIGH (ref 5–15)
BUN: 53 mg/dL — ABNORMAL HIGH (ref 6–20)
CO2: 20 mmol/L — ABNORMAL LOW (ref 22–32)
Calcium: 8.7 mg/dL — ABNORMAL LOW (ref 8.9–10.3)
Chloride: 90 mmol/L — ABNORMAL LOW (ref 98–111)
Creatinine, Ser: 6.56 mg/dL — ABNORMAL HIGH (ref 0.61–1.24)
GFR calc Af Amer: 10 mL/min — ABNORMAL LOW (ref >60)
GFR calc non Af Amer: 9 mL/min — ABNORMAL LOW (ref >60)
Glucose, Bld: 135 mg/dL — ABNORMAL HIGH (ref 70–99)
Phosphorus: 6.1 mg/dL — ABNORMAL HIGH (ref 2.5–4.6)
Potassium: 4.7 mmol/L (ref 3.5–5.1)
Sodium: 129 mmol/L — ABNORMAL LOW (ref 135–145)

## 2019-12-29 LAB — CULTURE, BLOOD (ROUTINE X 2)
Special Requests: ADEQUATE
Special Requests: ADEQUATE

## 2019-12-29 LAB — MAGNESIUM: Magnesium: 1.9 mg/dL (ref 1.7–2.4)

## 2019-12-29 MED ORDER — FERRIC CITRATE 1 GM 210 MG(FE) PO TABS
420.0000 mg | ORAL_TABLET | Freq: Three times a day (TID) | ORAL | Status: DC
  Administered 2019-12-29 – 2020-01-05 (×16): 420 mg via ORAL
  Filled 2019-12-29 (×17): qty 2

## 2019-12-29 MED ORDER — MIDODRINE HCL 5 MG PO TABS
15.0000 mg | ORAL_TABLET | Freq: Three times a day (TID) | ORAL | Status: DC
  Administered 2019-12-29 – 2020-01-05 (×18): 15 mg via ORAL
  Filled 2019-12-29 (×18): qty 3

## 2019-12-29 NOTE — Progress Notes (Signed)
[facility] for Infectious Disease   Reason for visit: Follow up on bacteremia  Interval History: no acute events, afebrile, WBC 19.9.  No associated rash or diarrhea.      Physical Exam: Constitutional:  Vitals:   12/29/19 0816 12/29/19 1154  BP: 98/60 99/66  Pulse: 95   Resp: (!) 27   Temp: 98.6 F (37 C) 99 F (37.2 C)  SpO2: 95% 92%   patient appears in NAD Respiratory: Normal respiratory effort; CTA B Cardiovascular: RRR GI: soft, nt, nd  Review of Systems: Constitutional: negative for fevers and chills Gastrointestinal: negative for nausea  Lab Results  Component Value Date   WBC 19.9 (H) 12/29/2019   HGB 10.5 (L) 12/29/2019   HCT 31.1 (L) 12/29/2019   MCV 85.9 12/29/2019   PLT 289 12/29/2019    Lab Results  Component Value Date   CREATININE 6.56 (H) 12/29/2019   BUN 53 (H) 12/29/2019   NA 129 (L) 12/29/2019   K 4.7 12/29/2019   CL 90 (L) 12/29/2019   CO2 20 (L) 12/29/2019    Lab Results  Component Value Date   ALT 31 12/28/2019   AST 52 (H) 01/04/2020   ALKPHOS 213 (H) 12/30/2019     Microbiology: Recent Results (from the past 240 hour(s))  SARS CORONAVIRUS 2 (TAT 6-24 HRS) Nasopharyngeal Nasopharyngeal Swab     Status: None   Collection Time: 12/22/19 11:45 PM   Specimen: Nasopharyngeal Swab  Result Value Ref Range Status   SARS Coronavirus 2 NEGATIVE NEGATIVE Final    Comment: (NOTE) SARS-CoV-2 target nucleic acids are NOT DETECTED. The SARS-CoV-2 RNA is generally detectable in upper and lower respiratory specimens during the acute phase of infection. Negative results do not preclude SARS-CoV-2 infection, do not rule out co-infections with other pathogens, and should not be used as the sole basis for treatment or other patient management decisions. Negative results must be combined with clinical observations, patient history, and epidemiological information. The expected result is Negative. Fact Sheet for  Patients: SugarRoll.be Fact Sheet for Healthcare Providers: https://www.woods-mathews.com/ This test is not yet approved or cleared by the [place] FDA and  has been authorized for detection and/or diagnosis of SARS-CoV-2 by FDA under an Emergency Use Authorization (EUA). This EUA will remain  in effect (meaning this test can be used) for the duration of the COVID-19 declaration under Section 56 4(b)(1) of the Act, 21 U.S.C. section 360bbb-3(b)(1), unless the authorization is terminated or revoked sooner. Performed at [facility], [place] [address], [place], [place] [address]   Blood Culture (routine x 2)     Status: Abnormal   Collection Time: 12/15/2019 10:15 PM   Specimen: BLOOD RIGHT ARM  Result Value Ref Range Status   Specimen Description BLOOD RIGHT ARM  Final   Special Requests   Final    BOTTLES DRAWN AEROBIC AND ANAEROBIC Blood Culture results may not be optimal due to an inadequate volume of blood received in culture bottles   Culture  Setup Time   Final    GRAM POSITIVE COCCI IN CLUSTERS IN BOTH AEROBIC AND ANAEROBIC BOTTLES CRITICAL RESULT CALLED TO, READ BACK BY AND VERIFIED WITH: J. Roselie Skinner, AT 1009 12/27/19 DV Performed at [facility], [place] [address], [place], [place] [address]    Culture METHICILLIN RESISTANT STAPHYLOCOCCUS AUREUS (A)  Final   Report Status 12/29/2019 FINAL  Final   Organism ID, Bacteria METHICILLIN RESISTANT STAPHYLOCOCCUS AUREUS  Final  Susceptibility   Methicillin resistant staphylococcus aureus - MIC*    CIPROFLOXACIN <=0.5 SENSITIVE Sensitive     ERYTHROMYCIN >=8 RESISTANT Resistant     GENTAMICIN <=0.5 SENSITIVE Sensitive     OXACILLIN >=4 RESISTANT Resistant     TETRACYCLINE <=1 SENSITIVE Sensitive     VANCOMYCIN 1 SENSITIVE Sensitive     TRIMETH/SULFA <=10 SENSITIVE Sensitive     CLINDAMYCIN <=0.25 SENSITIVE Sensitive     RIFAMPIN <=0.5 SENSITIVE Sensitive     Inducible  Clindamycin NEGATIVE Sensitive     * METHICILLIN RESISTANT STAPHYLOCOCCUS AUREUS  Blood Culture (routine x 2)     Status: Abnormal   Collection Time: 12/24/2019 10:41 PM   Specimen: BLOOD RIGHT HAND  Result Value Ref Range Status   Specimen Description BLOOD RIGHT HAND  Final   Special Requests AEROBIC BOTTLE ONLY Blood Culture adequate volume  Final   Culture  Setup Time   Final    GRAM POSITIVE COCCI IN CLUSTERS AEROBIC BOTTLE ONLY CRITICAL VALUE NOTED.  VALUE IS CONSISTENT WITH PREVIOUSLY REPORTED AND CALLED VALUE.    Culture (A)  Final    STAPHYLOCOCCUS AUREUS SUSCEPTIBILITIES PERFORMED ON PREVIOUS CULTURE WITHIN THE LAST 5 DAYS. Performed at [facility], [place] [address] [place], [place] [address]    Report Status 12/29/2019 FINAL  Final  Blood Culture ID Panel (Reflexed)     Status: Abnormal   Collection Time: 01/02/2020 10:41 PM  Result Value Ref Range Status   Enterococcus species NOT DETECTED NOT DETECTED Final   Listeria monocytogenes NOT DETECTED NOT DETECTED Final   Staphylococcus species DETECTED (A) NOT DETECTED Final    Comment: CRITICAL RESULT CALLED TO, READ BACK BY AND VERIFIED WITH: Andres Shad PharmD 14:30 12/27/19 ([email or web address])    Staphylococcus aureus (BCID) DETECTED (A) NOT DETECTED Final    Comment: Methicillin (oxacillin)-resistant Staphylococcus aureus (MRSA). MRSA is predictably resistant to beta-lactam antibiotics (except ceftaroline). Preferred therapy is vancomycin unless clinically contraindicated. Patient requires contact precautions if  hospitalized. CRITICAL RESULT CALLED TO, READ BACK BY AND VERIFIED WITH: Andres Shad PharmD 14:30 12/27/19 ([email or web address])    Methicillin resistance DETECTED (A) NOT DETECTED Final    Comment: CRITICAL RESULT CALLED TO, READ BACK BY AND VERIFIED WITH: Andres Shad PharmD 14:30 12/27/19 ([email or web address])    Streptococcus species NOT DETECTED NOT DETECTED Final   Streptococcus agalactiae NOT DETECTED NOT DETECTED Final   Streptococcus  pneumoniae NOT DETECTED NOT DETECTED Final   Streptococcus pyogenes NOT DETECTED NOT DETECTED Final   Acinetobacter baumannii NOT DETECTED NOT DETECTED Final   Enterobacteriaceae species NOT DETECTED NOT DETECTED Final   Enterobacter cloacae complex NOT DETECTED NOT DETECTED Final   Escherichia coli NOT DETECTED NOT DETECTED Final   Klebsiella oxytoca NOT DETECTED NOT DETECTED Final   Klebsiella pneumoniae NOT DETECTED NOT DETECTED Final   Proteus species NOT DETECTED NOT DETECTED Final   Serratia marcescens NOT DETECTED NOT DETECTED Final   Haemophilus influenzae NOT DETECTED NOT DETECTED Final   Neisseria meningitidis NOT DETECTED NOT DETECTED Final   Pseudomonas aeruginosa NOT DETECTED NOT DETECTED Final   Candida albicans NOT DETECTED NOT DETECTED Final   Candida glabrata NOT DETECTED NOT DETECTED Final   Candida krusei NOT DETECTED NOT DETECTED Final   Candida parapsilosis NOT DETECTED NOT DETECTED Final   Candida tropicalis NOT DETECTED NOT DETECTED Final    Comment: Performed at [facility], [place] [address] [place], [place] [address]  Respiratory Panel by RT PCR (Flu A&B, Covid) - Nasopharyngeal  Swab     Status: None   Collection Time: 12/28/2019 11:22 PM   Specimen: Nasopharyngeal Swab  Result Value Ref Range Status   SARS Coronavirus 2 by RT PCR NEGATIVE NEGATIVE Final    Comment: (NOTE) SARS-CoV-2 target nucleic acids are NOT DETECTED. The SARS-CoV-2 RNA is generally detectable in upper respiratoy specimens during the acute phase of infection. The lowest concentration of SARS-CoV-2 viral copies this assay can detect is 131 copies/mL. A negative result does not preclude SARS-Cov-2 infection and should not be used as the sole basis for treatment or other patient management decisions. A negative result may occur with  improper specimen collection/handling, submission of specimen other than nasopharyngeal swab, presence of viral mutation(s) within the areas targeted by  this assay, and inadequate number of viral copies (<131 copies/mL). A negative result must be combined with clinical observations, patient history, and epidemiological information. The expected result is Negative. Fact Sheet for Patients:  PinkCheek.be Fact Sheet for Healthcare Providers:  GravelBags.it This test is not yet ap proved or cleared by the [place] FDA and  has been authorized for detection and/or diagnosis of SARS-CoV-2 by FDA under an Emergency Use Authorization (EUA). This EUA will remain  in effect (meaning this test can be used) for the duration of the COVID-19 declaration under Section 564(b)(1) of the Act, 21 U.S.C. section 360bbb-3(b)(1), unless the authorization is terminated or revoked sooner.    Influenza A by PCR NEGATIVE NEGATIVE Final   Influenza B by PCR NEGATIVE NEGATIVE Final    Comment: (NOTE) The Xpert Xpress SARS-CoV-2/FLU/RSV assay is intended as an aid in  the diagnosis of influenza from Nasopharyngeal swab specimens and  should not be used as a sole basis for treatment. Nasal washings and  aspirates are unacceptable for Xpert Xpress SARS-CoV-2/FLU/RSV  testing. Fact Sheet for Patients: PinkCheek.be Fact Sheet for Healthcare Providers: GravelBags.it This test is not yet approved or cleared by the [place] FDA and  has been authorized for detection and/or diagnosis of SARS-CoV-2 by  FDA under an Emergency Use Authorization (EUA). This EUA will remain  in effect (meaning this test can be used) for the duration of the  Covid-19 declaration under Section 564(b)(1) of the Act, 21  U.S.C. section 360bbb-3(b)(1), unless the authorization is  terminated or revoked. Performed at [facility], [place] [address], [place], [place] [address]   MRSA PCR Screening     Status: Abnormal   Collection Time: 12/27/19  5:30 AM   Specimen:  Nasopharyngeal  Result Value Ref Range Status   MRSA by PCR POSITIVE (A) NEGATIVE Final    Comment:        The GeneXpert MRSA Assay (FDA approved for NASAL specimens only), is one component of a comprehensive MRSA colonization surveillance program. It is not intended to diagnose MRSA infection nor to guide or monitor treatment for MRSA infections. RESULT CALLED TO, READ BACK BY AND VERIFIED WITH: RN Sarajane Jews [id number] FCP Performed at [place] [address], [place],  [address]   Culture, blood (routine x 2)     Status: None (Preliminary result)   Collection Time: 12/28/19  5:40 AM   Specimen: BLOOD RIGHT HAND  Result Value Ref Range Status   Specimen Description BLOOD RIGHT HAND  Final   Special Requests   Final    BOTTLES DRAWN AEROBIC AND ANAEROBIC Blood Culture adequate volume   Culture   Final    NO GROWTH 1 DAY Performed  at [facility], [place] [address], [place], [place] [address]    Report Status PENDING  Incomplete  Culture, blood (routine x 2)     Status: Abnormal   Collection Time: 12/28/19  5:59 AM   Specimen: BLOOD RIGHT HAND  Result Value Ref Range Status   Specimen Description BLOOD RIGHT HAND  Final   Special Requests IN PEDIATRIC BOTTLE Blood Culture adequate volume  Final   Culture  Setup Time   Final    GRAM POSITIVE COCCI IN PEDIATRIC BOTTLE CRITICAL RESULT CALLED TO, READ BACK BY AND VERIFIED WITH: PHARMD LINDSAY CHEN @2021  12/28/2019 AKT    Culture (A)  Final    STAPHYLOCOCCUS AUREUS SUSCEPTIBILITIES PERFORMED ON PREVIOUS CULTURE WITHIN THE LAST 5 DAYS. Performed at [facility], [place] [address] [place] [address], [place], [place] [address]    Report Status 12/29/2019 FINAL  Final    Impression/Plan:  1. Bacteremia - MRSA and on vancomycin.  Doing well. Repeat blood cultures sent yesterday and 1 set positive again.  Will repeat again tomorrow.  Likely was too early.   Will need a TEE.   Lumbar MRI done and degraded but no  findings of infection.   2.  ESRD - using fistula for HD and no need for [facility] replacement at this time.

## 2019-12-29 NOTE — Progress Notes (Addendum)
[facility] Progress Note   Assessment/ Plan:    1. Septic shock 2/2 MRSA bacteremia: on vancomycin, off pressors now, BP still a little soft but feeling much better.  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  TDC has been removed (think that's the source).  Has back pain- getting MRI, couldn't tolerate.  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  Repeat blood cultures + 12/28/2018, may need endocarditis eval, will defer to ID for recs.  Also still needs discitis eval.  Using AVF without difficulty, no need for another [facility] at present.  2.  ESRD: MWF [place]-- missed 2 sessions, HD yesterday 3.20, next planned for Monday.  Had used AVF 6x as OP, used 2 16 g needles successfully yesterday.  Will try for 15 g needles tomorrow 3/22.  Using midodrine to augment BP, increasing to 15 mg TID.     3.  DM II: per primary  4.  Hyperkalemia: resolved  5.  Anemia: hgb 10.5, will monitor for need for ESA  6. BMMD: Phos up to 6.1, will add back Auryxia  7.  Dispo: admitted  Subjective:    HD yesterday with 2 16 g needles, did well.  Plan for HD tomorrow using 15 g needles     Objective:   BP 98/60 (BP Location: Right Arm)   Pulse 95   Temp 98.6 F (37 C) (Oral)   Resp (!) 27   Ht 5\' 8"  (1.727 m)   Wt 128.8 kg   SpO2 95%   BMI 43.17 kg/m   Physical Exam: GEN NAD, sleeping HEENT EOMI PERRL NECK no JVD CHEST: TDC site dressed PULM clear bilaterally no c/w/r CV RRR ABD obese EXT L wrist hot/ red/swollen/tender NEURO AAO x 3 no asterixis SKIN no rashes MSK L wrist as above ACCESS: LUE AVF +T/B, small infiltration  Labs: BMET Recent Labs  Lab 12/22/19 1949 01/02/2020 2220 12/14/2019 2245 12/27/19 0434 12/27/19 2253 12/28/19 0818 12/29/19 0751  NA 125* 125* 124* 128* 123* 125* 129*  K 6.1* 5.6* 5.4* 5.3* 5.5* 6.3* 4.7  CL 84* 86*  --  89* 87* 87* 90*  CO2 22 17*  --  17* 17* 19* 20*  GLUCOSE 117* 137*  --  115* 120* 114* 135*  BUN 62* 62*  --  62*  74* 81* 53*  CREATININE 12.00* 10.21*  --  9.93* 10.50* 10.97* 6.56*  CALCIUM 8.6* 8.3*  --  8.2* 7.6* 7.6* 8.7*  PHOS  --   --   --  6.0* 6.1* 7.0* 6.1*   CBC Recent Labs  Lab 12/22/19 1949 12/22/19 1949 12/30/2019 2220 01/06/2020 2245 12/27/19 0434 12/27/19 2253 12/28/19 0818 12/29/19 0751  WBC 15.7*   < > 19.7*   < > 22.8* 13.5* 18.0* 19.9*  NEUTROABS 14.5*  --  17.5*  --   --   --  15.1* 16.5*  HGB 12.2*   < > 11.3*   < > 10.9* 11.4* 10.7* 10.5*  HCT 38.6*   < > 35.4*   < > 34.2* 34.8* 32.2* 31.1*  MCV 93.9   < > 92.2   < > 92.2 89.9 87.3 85.9  PLT 270   < > 250   < > 241 247 273 289   < > = values in this interval not displayed.      Medications:    . Chlorhexidine Gluconate Cloth  6 each Topical Q0600  . heparin  5,000 Units Subcutaneous Q8H  .  insulin aspart  0-20 Units Subcutaneous TID WC  . midodrine  10 mg Oral TID WC     Madelon Lips MD 12/29/2019, 10:44 AM

## 2019-12-29 NOTE — Progress Notes (Signed)
Patient's home CPAP was set up for use overnight

## 2019-12-29 NOTE — Progress Notes (Addendum)
PROGRESS NOTE    Seth [name]  EXN:[id number] DOB: 1966/03/21 DOA: 12/15/2019 PCP: Mitzi Hansen, MD    Brief Narrative: HPI per Dr. Halford Chessman 54 year old male with PMH as below, which is significant for ESRD on HD, chronic hypoxemic respiratory failure no home O2, OSA on CPAP, DM, discitis, and cord compression myelopathy. Most recent HD was 3/17. He left the session 45 mins early due to back pain. He was in his usual state of health until approximately 3/11 when he developed muscle aches and back pain. He had just received his 1st COVID-19 vaccine a few days prior and it was felt these symptoms were secondary to this. He has been seen twice in the ED in the past week. First for complaints of febrile illness. He was not given any antibiotics at that time. He then returned 3/18 with complaints of myalgias. He returned to ED later in the day 3/18 via EMS after being found to have altered mental status at home. Upon arrival to ED he was noted to be hypotensive with SBP in the 60s and hypoxemic with O2 sats in the 70s on room air. Improved with supplemental O2 and levophed infusion after 248m bolus. There were also concerns he had been unable to use his legs the past few days and MRI spine was ordered. PCCM asked to evaluate for admission due to need for vasoactive infusion.   Assessment & Plan:   Principal Problem:   Septic shock ([facility] Active Problems:   Bacteremia   Hyperlipidemia associated with type 2 diabetes mellitus ([facility]   ESRD (end stage renal disease) on dialysis ([name])   Absolute anemia   DM (diabetes mellitus) type 2, uncontrolled, with ketoacidosis ([name])   Hyperkalemia   Hyponatremia   Sepsis due to infected intravenous catheter ([name])   Wrist pain, acute, left  1 septic shock secondary to MRSA bacteremia secondary to infected HD catheter/ Patient had presented noted to be in septic shock requiring pressors felt secondary to infected HD catheter.  Infected HD catheter  removed 12/27/2019 with some purulence noted.  Patient currently off pressors.  Systolic blood pressure low 90s.  MRSA PCR positive.  Blood cultures positive for MRSA.  2D echo negative for any vegetations/endocarditis.  Patient currently in hemodialysis.  Continue IV vancomycin.  On midodrine.  Repeat blood cultures from 12/28/2019 + for staph aureus.  Will likely repeat blood cultures in approximately 48-72 hours.  ID following and appreciate their input and recommendations.  2.  Hyperkalemia/hyponatremia/acidosis/hyperphosphatemia Secondary to end-stage renal disease.  Status post Lokelma.  Potassium at 4.7 this morning.  Sodium levels improving currently at 129.  Phosphorus at 6.1.  Patient end-stage renal disease receiving hemodialysis.  Per nephrology.   3.  Lower extremity weakness Patient still with lower extremity weakness.  Initially unable to obtain MRI due to claustrophobia however was given some Ativan and MRI obtained earlier this morning 12/29/2019.  MRI of the L-spine obtained with no acute abnormality or evidence for infection within the lumbar spine, technically limited exam due to motion artifact.  10 mm anterior listhesis of L5 on S1 with associated moderate bilateral subarticular stenosis, with moderate to severe bilateral L5 foraminal narrowing.  Right foraminal to extraforaminal disc protrusion at L3-4 potentially affecting the exiting right L3 nerve root.  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  PT/OT.   4.  Left wrist pain Plain films of the left wrist negative for any acute abnormalities.  Supportive  care.  Follow.  5.  Diabetes mellitus II Hemoglobin A1c of 7.1.  CBG of 134.  Continue sliding scale insulin.  6.  End-stage renal disease Patient being followed by nephrology.  7.  OSA CPAP  8.  Hypertension Patient presented in septic shock.  Systolic blood pressure is soft in the 90s.  Patient currently  on midodrine.  Continue to hold Norvasc.   DVT prophylaxis: Heparin Code Status: Full Family Communication: Updated patient.   Disposition Plan:  . Patient came from: Home            . Anticipated d/c place: Home . Barriers to d/c OR conditions which need to be met to effect a safe d/c: Likely home with home health versus SNF pending PT evaluation, once clinically improved, improvement with hypotension/sepsis, electrolyte abnormalities corrected, cleared by ID and nephrology.   Consultants:   Patient admitted to critical care service  ID: Dr.Comer 12/27/2019  Nephrology: Dr. Hollie Salk 12/27/2019  Procedures:   CT left wrist 12/27/2019  CT angiogram chest 12/13/2019  Plain films of the left wrist 12/27/2019  Chest x-ray 12/21/2019  Incomplete MRI T and L-spine 12/27/2019--- patient could not tolerate due to claustrophobia.  2D echo 12/27/2019  Successful removal of tunneled left IJ hemodialysis catheter with purulence expressed from tract per Ascencion Dike, PA interventional radiology 12/27/2019  Antimicrobials:  IV Zosyn 12/27/2019>>>> 12/27/2019  IV vancomycin 12/25/2019   Subjective: Patient asleep but arousable.  Denies any chest pain or shortness of breath.  Some complaints of pain in the legs however not clear as he is somewhat sleepy.  Patient with weakness in his lower extremities.    Objective: Vitals:   12/28/19 1958 12/29/19 0100 12/29/19 0402 12/29/19 0816  BP: (!) 96/58 (!) 1'02/58 93/65 98/60 '$  Pulse: 87 92 96 95  Resp: '20 19 20 '$ (!) 27  Temp: 97.7 F (36.5 C) 98.2 F (36.8 C) 97.6 F (36.4 C) 98.6 F (37 C)  TempSrc:  Oral Axillary Oral  SpO2:  96% 97% 95%  Weight:      Height:        Intake/Output Summary (Last 24 hours) at 12/29/2019 0934 Last data filed at 12/28/2019 1546 Gross per 24 hour  Intake 50 ml  Output 1922 ml  Net -1872 ml   Filed Weights   12/27/19 2128 12/28/19 1023 12/28/19 1452  Weight: 130.9 kg 129.8 kg 128.8 kg     Examination:  General exam: NAD.  Respiratory system: Lungs clear to auscultation bilaterally anterior lung fields.  Normal respiratory effort.  Cardiovascular system: Regular rate rhythm no murmurs rubs or gallops.  No JVD.  No lower extremity edema. Gastrointestinal system: Abdomen is soft, nontender, nondistended, positive bowel sounds.  No rebound.  No guarding.  Central nervous system: Alert and oriented. No focal neurological deficits. Extremities: 2-3/5 bilateral lower extremity strength.  Skin: No rashes, lesions or ulcers Psychiatry: Judgement and insight appear normal. Mood & affect appropriate.     Data Reviewed: I have personally reviewed following labs and imaging studies  CBC: Recent Labs  Lab 12/22/19 1949 12/22/19 1949 12/15/2019 2220 12/09/2019 2220 12/28/2019 2245 12/27/19 0434 12/27/19 2253 12/28/19 0818 12/29/19 0751  WBC 15.7*   < > 19.7*  --   --  22.8* 13.5* 18.0* 19.9*  NEUTROABS 14.5*  --  17.5*  --   --   --   --  15.1* 16.5*  HGB 12.2*   < > 11.3*   < > 12.9* 10.9* 11.4* 10.7* 10.5*  HCT 38.6*   < > 35.4*   < > 38.0* 34.2* 34.8* 32.2* 31.1*  MCV 93.9   < > 92.2  --   --  92.2 89.9 87.3 85.9  PLT 270   < > 250  --   --  241 247 273 289   < > = values in this interval not displayed.   Basic Metabolic Panel: Recent Labs  Lab 12/29/2019 2220 01/02/2020 2220 01/04/2020 2245 12/27/19 0434 12/27/19 2253 12/28/19 0818 12/29/19 0751  NA 125*   < > 124* 128* 123* 125* 129*  K 5.6*   < > 5.4* 5.3* 5.5* 6.3* 4.7  CL 86*  --   --  89* 87* 87* 90*  CO2 17*  --   --  17* 17* 19* 20*  GLUCOSE 137*  --   --  115* 120* 114* 135*  BUN 62*  --   --  62* 74* 81* 53*  CREATININE 10.21*  --   --  9.93* 10.50* 10.97* 6.56*  CALCIUM 8.3*  --   --  8.2* 7.6* 7.6* 8.7*  MG  --   --   --  1.6*  --  2.0 1.9  PHOS  --   --   --  6.0* 6.1* 7.0* 6.1*   < > = values in this interval not displayed.   GFR: Estimated Creatinine Clearance: 16.7 mL/min (A) (by C-G formula  based on SCr of 6.56 mg/dL (H)). Liver Function Tests: Recent Labs  Lab 12/22/19 1949 12/12/2019 2220 12/27/19 2253 12/28/19 0818 12/29/19 0751  AST 15 52*  --   --   --   ALT 12 31  --   --   --   ALKPHOS 150* 213*  --   --   --   BILITOT 1.0 1.1  --   --   --   PROT 8.2* 7.4  --   --   --   ALBUMIN 3.0* 2.2* 1.8* 1.8* 1.7*   No results for input(s): LIPASE, AMYLASE in the last 168 hours. No results for input(s): AMMONIA in the last 168 hours. Coagulation Profile: Recent Labs  Lab 12/27/19 0010  INR 1.4*   Cardiac Enzymes: No results for input(s): CKTOTAL, CKMB, CKMBINDEX, TROPONINI in the last 168 hours. BNP (last 3 results) No results for input(s): PROBNP in the last 8760 hours. HbA1C: Recent Labs    12/27/19 0434  HGBA1C 7.1*   CBG: Recent Labs  Lab 12/27/19 2212 12/28/19 0833 12/28/19 1607 12/28/19 2102 12/29/19 0757  GLUCAP 112* 109* 134* 129* 134*   Lipid Profile: No results for input(s): CHOL, HDL, LDLCALC, TRIG, CHOLHDL, LDLDIRECT in the last 72 hours. Thyroid Function Tests: No results for input(s): TSH, T4TOTAL, FREET4, T3FREE, THYROIDAB in the last 72 hours. Anemia Panel: No results for input(s): VITAMINB12, FOLATE, FERRITIN, TIBC, IRON, RETICCTPCT in the last 72 hours. Sepsis Labs: Recent Labs  Lab 12/22/19 1949 12/09/2019 2244 12/27/19 0020 12/27/19 0434  PROCALCITON  --   --   --  >150.00  LATICACIDVEN 1.6 4.1* 2.4*  --     Recent Results (from the past 240 hour(s))  SARS CORONAVIRUS 2 (TAT 6-24 HRS) Nasopharyngeal Nasopharyngeal Swab     Status: None   Collection Time: 12/22/19 11:45 PM   Specimen: Nasopharyngeal Swab  Result Value Ref Range Status   SARS Coronavirus 2 NEGATIVE NEGATIVE Final    Comment: (NOTE) SARS-CoV-2 target nucleic acids are NOT DETECTED. The SARS-CoV-2 RNA is generally detectable in upper and  lower respiratory specimens during the acute phase of infection. Negative results do not preclude SARS-CoV-2 infection,  do not rule out co-infections with other pathogens, and should not be used as the sole basis for treatment or other patient management decisions. Negative results must be combined with clinical observations, patient history, and epidemiological information. The expected result is Negative. Fact Sheet for Patients: SugarRoll.be Fact Sheet for Healthcare Providers: https://www.woods-mathews.com/ This test is not yet approved or cleared by the [place] FDA and  has been authorized for detection and/or diagnosis of SARS-CoV-2 by FDA under an Emergency Use Authorization (EUA). This EUA will remain  in effect (meaning this test can be used) for the duration of the COVID-19 declaration under Section 56 4(b)(1) of the Act, 21 U.S.C. section 360bbb-3(b)(1), unless the authorization is terminated or revoked sooner. Performed at [facility], [place] [address] [place], [place] [address]   Blood Culture (routine x 2)     Status: Abnormal   Collection Time: 01/02/2020 10:15 PM   Specimen: BLOOD RIGHT ARM  Result Value Ref Range Status   Specimen Description BLOOD RIGHT ARM  Final   Special Requests   Final    BOTTLES DRAWN AEROBIC AND ANAEROBIC Blood Culture results may not be optimal due to an inadequate volume of blood received in culture bottles   Culture  Setup Time   Final    GRAM POSITIVE COCCI IN CLUSTERS IN BOTH AEROBIC AND ANAEROBIC BOTTLES CRITICAL RESULT CALLED TO, READ BACK BY AND VERIFIED WITH: J. Roselie Skinner, AT 1009 12/27/19 DV Performed at [facility], [place] [address] [place], [place] [address]    Culture METHICILLIN RESISTANT STAPHYLOCOCCUS AUREUS (A)  Final   Report Status 12/29/2019 FINAL  Final   Organism ID, Bacteria METHICILLIN RESISTANT STAPHYLOCOCCUS AUREUS  Final      Susceptibility   Methicillin resistant staphylococcus aureus - MIC*    CIPROFLOXACIN <=0.5 SENSITIVE Sensitive     ERYTHROMYCIN >=8 RESISTANT  Resistant     GENTAMICIN <=0.5 SENSITIVE Sensitive     OXACILLIN >=4 RESISTANT Resistant     TETRACYCLINE <=1 SENSITIVE Sensitive     VANCOMYCIN 1 SENSITIVE Sensitive     TRIMETH/SULFA <=10 SENSITIVE Sensitive     CLINDAMYCIN <=0.25 SENSITIVE Sensitive     RIFAMPIN <=0.5 SENSITIVE Sensitive     Inducible Clindamycin NEGATIVE Sensitive     * METHICILLIN RESISTANT STAPHYLOCOCCUS AUREUS  Blood Culture (routine x 2)     Status: Abnormal   Collection Time: 01/08/2020 10:41 PM   Specimen: BLOOD RIGHT HAND  Result Value Ref Range Status   Specimen Description BLOOD RIGHT HAND  Final   Special Requests AEROBIC BOTTLE ONLY Blood Culture adequate volume  Final   Culture  Setup Time   Final    GRAM POSITIVE COCCI IN CLUSTERS AEROBIC BOTTLE ONLY CRITICAL VALUE NOTED.  VALUE IS CONSISTENT WITH PREVIOUSLY REPORTED AND CALLED VALUE.    Culture (A)  Final    STAPHYLOCOCCUS AUREUS SUSCEPTIBILITIES PERFORMED ON PREVIOUS CULTURE WITHIN THE LAST 5 DAYS. Performed at [facility], [place] [address], [place], [place] [address]    Report Status 12/29/2019 FINAL  Final  Blood Culture ID Panel (Reflexed)     Status: Abnormal   Collection Time: 12/16/2019 10:41 PM  Result Value Ref Range Status   Enterococcus species NOT DETECTED NOT DETECTED Final   Listeria monocytogenes NOT DETECTED NOT DETECTED Final   Staphylococcus species DETECTED (A) NOT DETECTED Final    Comment:  CRITICAL RESULT CALLED TO, READ BACK BY AND VERIFIED WITH: Andres Shad PharmD 14:30 12/27/19 ([email or web address])    Staphylococcus aureus (BCID) DETECTED (A) NOT DETECTED Final    Comment: Methicillin (oxacillin)-resistant Staphylococcus aureus (MRSA). MRSA is predictably resistant to beta-lactam antibiotics (except ceftaroline). Preferred therapy is vancomycin unless clinically contraindicated. Patient requires contact precautions if  hospitalized. CRITICAL RESULT CALLED TO, READ BACK BY AND VERIFIED WITH: Andres Shad PharmD 14:30 12/27/19 ([email or web address])     Methicillin resistance DETECTED (A) NOT DETECTED Final    Comment: CRITICAL RESULT CALLED TO, READ BACK BY AND VERIFIED WITH: Andres Shad PharmD 14:30 12/27/19 ([email or web address])    Streptococcus species NOT DETECTED NOT DETECTED Final   Streptococcus agalactiae NOT DETECTED NOT DETECTED Final   Streptococcus pneumoniae NOT DETECTED NOT DETECTED Final   Streptococcus pyogenes NOT DETECTED NOT DETECTED Final   Acinetobacter baumannii NOT DETECTED NOT DETECTED Final   Enterobacteriaceae species NOT DETECTED NOT DETECTED Final   Enterobacter cloacae complex NOT DETECTED NOT DETECTED Final   Escherichia coli NOT DETECTED NOT DETECTED Final   Klebsiella oxytoca NOT DETECTED NOT DETECTED Final   Klebsiella pneumoniae NOT DETECTED NOT DETECTED Final   Proteus species NOT DETECTED NOT DETECTED Final   Serratia marcescens NOT DETECTED NOT DETECTED Final   Haemophilus influenzae NOT DETECTED NOT DETECTED Final   Neisseria meningitidis NOT DETECTED NOT DETECTED Final   Pseudomonas aeruginosa NOT DETECTED NOT DETECTED Final   Candida albicans NOT DETECTED NOT DETECTED Final   Candida glabrata NOT DETECTED NOT DETECTED Final   Candida krusei NOT DETECTED NOT DETECTED Final   Candida parapsilosis NOT DETECTED NOT DETECTED Final   Candida tropicalis NOT DETECTED NOT DETECTED Final    Comment: Performed at [facility], [place]. [address], [place], [place] [address]  Respiratory Panel by RT PCR (Flu A&B, Covid) - Nasopharyngeal Swab     Status: None   Collection Time: 12/27/2019 11:22 PM   Specimen: Nasopharyngeal Swab  Result Value Ref Range Status   SARS Coronavirus 2 by RT PCR NEGATIVE NEGATIVE Final    Comment: (NOTE) SARS-CoV-2 target nucleic acids are NOT DETECTED. The SARS-CoV-2 RNA is generally detectable in upper respiratoy specimens during the acute phase of infection. The lowest concentration of SARS-CoV-2 viral copies this assay can detect is 131 copies/mL. A negative result does not preclude  SARS-Cov-2 infection and should not be used as the sole basis for treatment or other patient management decisions. A negative result may occur with  improper specimen collection/handling, submission of specimen other than nasopharyngeal swab, presence of viral mutation(s) within the areas targeted by this assay, and inadequate number of viral copies (<131 copies/mL). A negative result must be combined with clinical observations, patient history, and epidemiological information. The expected result is Negative. Fact Sheet for Patients:  PinkCheek.be Fact Sheet for Healthcare Providers:  GravelBags.it This test is not yet ap proved or cleared by the [place] FDA and  has been authorized for detection and/or diagnosis of SARS-CoV-2 by FDA under an Emergency Use Authorization (EUA). This EUA will remain  in effect (meaning this test can be used) for the duration of the COVID-19 declaration under Section 564(b)(1) of the Act, 21 U.S.C. section 360bbb-3(b)(1), unless the authorization is terminated or revoked sooner.    Influenza A by PCR NEGATIVE NEGATIVE Final   Influenza B by PCR NEGATIVE NEGATIVE Final    Comment: (NOTE) The Xpert Xpress SARS-CoV-2/FLU/RSV assay is intended as an aid in  the diagnosis of influenza from Nasopharyngeal  swab specimens and  should not be used as a sole basis for treatment. Nasal washings and  aspirates are unacceptable for Xpert Xpress SARS-CoV-2/FLU/RSV  testing. Fact Sheet for Patients: PinkCheek.be Fact Sheet for Healthcare Providers: GravelBags.it This test is not yet approved or cleared by the [place] FDA and  has been authorized for detection and/or diagnosis of SARS-CoV-2 by  FDA under an Emergency Use Authorization (EUA). This EUA will remain  in effect (meaning this test can be used) for the duration of the  Covid-19  declaration under Section 564(b)(1) of the Act, 21  U.S.C. section 360bbb-3(b)(1), unless the authorization is  terminated or revoked. Performed at [facility], [place] [address] [place], [place] [address]   MRSA PCR Screening     Status: Abnormal   Collection Time: 12/27/19  5:30 AM   Specimen: Nasopharyngeal  Result Value Ref Range Status   MRSA by PCR POSITIVE (A) NEGATIVE Final    Comment:        The GeneXpert MRSA Assay (FDA approved for NASAL specimens only), is one component of a comprehensive MRSA colonization surveillance program. It is not intended to diagnose MRSA infection nor to guide or monitor treatment for MRSA infections. RESULT CALLED TO, READ BACK BY AND VERIFIED WITH: RN Sarajane Jews [phone] FCP Performed at [place] [address], [place], [place] [address]   Culture, blood (routine x 2)     Status: None (Preliminary result)   Collection Time: 12/28/19  5:40 AM   Specimen: BLOOD RIGHT HAND  Result Value Ref Range Status   Specimen Description BLOOD RIGHT HAND  Final   Special Requests   Final    BOTTLES DRAWN AEROBIC AND ANAEROBIC Blood Culture adequate volume   Culture   Final    NO GROWTH 1 DAY Performed at [facility], [place] [address], [place], [place] [address]    Report Status PENDING  Incomplete  Culture, blood (routine x 2)     Status: Abnormal   Collection Time: 12/28/19  5:59 AM   Specimen: BLOOD RIGHT HAND  Result Value Ref Range Status   Specimen Description BLOOD RIGHT HAND  Final   Special Requests IN PEDIATRIC BOTTLE Blood Culture adequate volume  Final   Culture  Setup Time   Final    GRAM POSITIVE COCCI IN PEDIATRIC BOTTLE CRITICAL RESULT CALLED TO, READ BACK BY AND VERIFIED WITH: PHARMD LINDSAY CHEN '@2021'$  12/28/2019 AKT    Culture (A)  Final    STAPHYLOCOCCUS AUREUS SUSCEPTIBILITIES PERFORMED ON PREVIOUS CULTURE WITHIN THE LAST 5 DAYS. Performed at [facility], [place] [address] [place],   [address]    Report Status 12/29/2019 FINAL  Final         Radiology Studies: MR THORACIC SPINE WO CONTRAST  Result Date: 12/27/2019 CLINICAL DATA:  Back pain. Patient is claustrophobic and could not tolerate more than initial sagittal imaging. EXAM: MRI THORACIC SPINE WITHOUT CONTRAST TECHNIQUE: Multiplanar, multisequence MR imaging of the thoracic spine was performed. No intravenous contrast was administered. COMPARISON:  CT chest yesterday. FINDINGS: Kyphotic curvature in the upper thoracic region because of fracture in fusion at T4 and T5. Posterior fusion with pedicle screws and rods from T1 through T7. Very limited useful information otherwise. No sign of acute regional fracture or other acute complication. Chronic marrow space entity anteriorly at T10, not likely significant, probably a hemangioma or cyst. Inferior endplate Schmorl's node at T12. No finding to suggest  any acute or unexpected process on this limited exam. IMPRESSION: The patient could only tolerate few sagittal sequences because of claustrophobia. This is a very abbreviated study. Compared to the CT scan of yesterday, no new useful information is obtained. Previous thoracic fusion from T1 through T7. Kyphotic deformity in the upper thoracic region. No sign of an acute process such as new fracture or obvious spinal infection. Electronically Signed   By: Nelson Chimes M.D.   On: 12/27/2019 14:12   MR LUMBAR SPINE WO CONTRAST  Result Date: 12/29/2019 CLINICAL DATA:  Initial evaluation for low back pain, progressive neurologic deficit. EXAM: MRI LUMBAR SPINE WITHOUT CONTRAST TECHNIQUE: Multiplanar, multisequence MR imaging of the lumbar spine was performed. No intravenous contrast was administered. COMPARISON:  Prior MRI from 12/05/2018. FINDINGS: Segmentation: Examination technically limited by motion artifact and body habitus. Transitional lumbosacral anatomy with lumbarized S1 segment. Same numbering system employed as on previous  exams. Alignment: 10 mm anterolisthesis of L5 on S1, chronic and facet mediated, and relatively stable from previous. No associated pars defect. Alignment otherwise normal with preservation of the normal lumbar lordosis. Vertebrae: Vertebral body height maintained without evidence for acute or chronic fracture. Diffusely decreased T1 and T2 signal intensity seen throughout the visualized bone marrow, likely related to end-stage renal disease. No focal or worrisome osseous lesions. No findings to suggest acute discitis or osteomyelitis. Conus medullaris and cauda equina: Conus extends to the L1-2 level. Conus and cauda equina appear normal. No epidural collections. Paraspinal and other soft tissues: Paraspinous soft tissues demonstrate no acute finding. Paraspinous muscular atrophy noted. Atrophic kidneys with suspected scattered renal cysts, not well assessed on this technically limited exam. Disc levels: T12-L1: Unremarkable. L1-2: Unremarkable. L2-3:  Unremarkable. L3-4: Mild diffuse disc bulge with disc desiccation. Superimposed right foraminal to extraforaminal disc protrusion contacts the exiting right L3 nerve root (series 5, image 3). Mild right foraminal stenosis. Central canal remains patent. No significant left foraminal narrowing. L4-5: Negative interspace. Mild epidural lipomatosis. No significant stenosis. L5-S1: 10 mm anterolisthesis of L5 on S1. Associated broad posterior pseudo disc bulge/uncovering. Associated annular fissure. Severe bilateral facet arthrosis. Associated trace bilateral joint effusions, likely degenerative. Epidural lipomatosis. Resultant moderate canal with bilateral lateral recess stenosis, worse on the right. Moderate to severe bilateral L5 foraminal narrowing, similar to previous. S1-2 rudimentary S1-S2 interspace. No significant disc bulge. Epidural lipomatosis. No stenosis. IMPRESSION: 1. Technically limited exam due to motion artifact. No acute abnormality or evidence for  infection within the lumbar spine. 2. 10 mm anterolisthesis of L5 on S1 with associated moderate bilateral subarticular stenosis, with moderate to severe bilateral L5 foraminal narrowing. 3. Right foraminal to extraforaminal disc protrusion at L3-4, potentially affecting the exiting right L3 nerve root. 4. 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 Electronically Signed   By: Jeannine Boga M.D.   On: 12/29/2019 03:26   CT WRIST LEFT W CONTRAST  Result Date: 12/27/2019 CLINICAL DATA:  Left wrist pain and swelling. Question osteomyelitis. EXAM: CT OF THE UPPER LEFT EXTREMITY WITH CONTRAST TECHNIQUE: Multidetector CT imaging of the upper left extremity was performed according to the standard protocol following intravenous contrast administration. CONTRAST:  100 mL OMNIPAQUE IOHEXOL 300 MG/ML  SOLN COMPARISON:  Plain films left wrist today. FINDINGS: Bones/Joint/Cartilage There is no bony destructive change or periosteal reaction to suggest osteomyelitis. Bone-on-bone joint space narrowing with extensive subchondral cyst formation is seen at the articulations of the capitate, hamate, lunate and triquetrum. No fracture  or dislocation. No joint effusion. Ligaments Suboptimally assessed by CT. Muscles and Tendons Intact. No intramuscular fluid collection. No gas within muscle or tracking along fascial planes. Soft tissues No soft tissue gas. No abscess. No infiltration of subcutaneous fat. IMPRESSION: No acute abnormality.  No evidence of infection. 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 Electronically Signed   By: Inge Rise M.D.   On: 12/27/2019 14:09   IR Removal Tun Cv Cath W/O FL  Result Date: 12/27/2019 INDICATION: End-stage renal disease. Bacteremia. Request removal of tunneled hemodialysis catheter. Catheter originally placed by vascular service. EXAM: REMOVAL OF  TUNNELED LEFT IJ HEMODIALYSIS CATHETER MEDICATIONS: None COMPLICATIONS: None immediate. PROCEDURE: Informed written consent was obtained from the patient following an explanation of the procedure, risks, benefits and alternatives to treatment. A time out was performed prior to the initiation of the procedure. Maximal barrier sterile technique was utilized including caps, mask, sterile gowns, sterile gloves, large sterile drape, hand hygiene, and chlorhexidine. 1% lidocaine was injected under sterile conditions along the subcutaneous tunnel. Utilizing a combination of blunt dissection and gentle traction, the cuff of the catheter was exposed and the catheter was removed intact. Hemostasis was obtained with manual compression. A dressing was placed. The patient tolerated the procedure well without immediate post procedural complication. IMPRESSION: Successful removal of tunneled left IJ dialysis catheter. Read by: Ascencion Dike PA-C Electronically Signed   By: Jerilynn Mages.  Shick M.D.   On: 12/27/2019 11:27   ECHOCARDIOGRAM COMPLETE  Result Date: 12/27/2019    ECHOCARDIOGRAM REPORT   Patient Name:   ELIJIAH MICKLEY Date of Exam: 12/27/2019 Medical Rec #:  [id number]              Height:       67.5 in Accession #:    [id number]             Weight:       270.0 lb Date of Birth:  06-22-1966               BSA:          2.310 m Patient Age:    57 years               BP:           118/69 mmHg Patient Gender: M                      HR:           80 bpm. Exam Location:  Inpatient Procedure: 2D Echo, Cardiac Doppler, Color Doppler and Intracardiac            Opacification Agent Indications:    Shock  History:        Patient has prior history of Echocardiogram examinations, most                 recent 06/03/2019. Morbid obesity, Signs/Symptoms:Altered Mental                 Status and Hypotension; Risk Factors:Diabetes. Septic                 shock,ESRD.  Sonographer:    Dustin Flock Referring Phys: [phone] Silvestre Moment [facility]   Sonographer Comments: Technically difficult study due to poor echo windows and patient is morbidly obese. Image acquisition challenging due to patient body habitus. IMPRESSIONS  1. Left ventricular ejection fraction, by estimation, is 55 to 60%. The left ventricle  has normal function. Left ventricular endocardial border not optimally defined to evaluate regional wall motion. Left ventricular diastolic parameters were normal.  2. Right ventricular systolic function is mildly reduced. The right ventricular size is not well visualized. Tricuspid regurgitation signal is inadequate for assessing PA pressure.  3. Left atrial size was mildly dilated.  4. The mitral valve is grossly normal. No evidence of mitral valve regurgitation.  5. The aortic valve was not well visualized. Aortic valve regurgitation is not visualized. FINDINGS  Left Ventricle: Left ventricular ejection fraction, by estimation, is 55 to 60%. The left ventricle has normal function. Left ventricular endocardial border not optimally defined to evaluate regional wall motion. The left ventricular internal cavity size was normal in size. There is no left ventricular hypertrophy. Left ventricular diastolic parameters were normal. Right Ventricle: The right ventricular size is not well visualized. Right vetricular wall thickness was not assessed. Right ventricular systolic function is mildly reduced. Tricuspid regurgitation signal is inadequate for assessing PA pressure. Left Atrium: Left atrial size was mildly dilated. Right Atrium: Right atrial size was normal in size. Pericardium: There is no evidence of pericardial effusion. Mitral Valve: The mitral valve is grossly normal. No evidence of mitral valve regurgitation. Tricuspid Valve: The tricuspid valve is not well visualized. Tricuspid valve regurgitation is not demonstrated. Aortic Valve: The aortic valve was not well visualized. Aortic valve regurgitation is not visualized. There is mild calcification of  the aortic valve. Pulmonic Valve: The pulmonic valve was not well visualized. Pulmonic valve regurgitation is not visualized. Aorta: The aortic root is normal in size and structure. Venous: The inferior vena cava was not well visualized. IAS/Shunts: The interatrial septum was not well visualized.  LEFT VENTRICLE PLAX 2D LVIDd:         4.90 cm  Diastology LVIDs:         3.40 cm  LV e' lateral:   9.96 cm/s LV PW:         1.00 cm  LV E/e' lateral: 9.1 LV IVS:        1.00 cm  LV e' medial:    9.00 cm/s LVOT diam:     2.30 cm  LV E/e' medial:  10.1 LV SV:         95 LV SV Index:   41 LVOT Area:     4.15 cm  RIGHT VENTRICLE RV Basal diam:  3.90 cm RV S prime:     8.08 cm/s TAPSE (M-mode): 3.6 cm LEFT ATRIUM           Index       RIGHT ATRIUM           Index LA diam:      4.10 cm 1.77 cm/m  RA Area:     18.90 cm LA Vol (A4C): 82.9 ml 35.89 ml/m RA Volume:   51.50 ml  22.29 ml/m  AORTIC VALVE LVOT Vmax:   115.00 cm/s LVOT Vmean:  78.100 cm/s LVOT VTI:    0.228 m  AORTA Ao Root diam: 3.10 cm MITRAL VALVE MV Area (PHT): 2.79 cm    SHUNTS MV Decel Time: 272 msec    Systemic VTI:  0.23 m MV E velocity: 90.80 cm/s  Systemic Diam: 2.30 cm MV A velocity: 77.60 cm/s MV E/A ratio:  1.17 Cherlynn Kaiser MD Electronically signed by Cherlynn Kaiser MD Signature Date/Time: 12/27/2019/2:29:03 PM    Final         Scheduled Meds: . Chlorhexidine Gluconate Cloth  6 each  Topical Q0600  . heparin  5,000 Units Subcutaneous Q8H  . insulin aspart  0-20 Units Subcutaneous TID WC  . midodrine  10 mg Oral TID WC   Continuous Infusions: . sodium chloride 250 mL (12/27/19 0436)  . sodium chloride    . sodium chloride    . norepinephrine (LEVOPHED) Adult infusion Stopped (12/27/19 1324)  . vancomycin Stopped (12/27/19 1700)     LOS: 2 days    Time spent: 35 minutes    Irine Seal, MD [facility]   To contact the attending provider between 7A-7P or the covering provider during after hours 7P-7A, please  log into the web site [email or web address] and access using universal [place] password for that web site. If you do not have the password, please call the hospital operator.  12/29/2019, 9:34 AM

## 2019-12-30 ENCOUNTER — Encounter (HOSPITAL_COMMUNITY): Payer: Self-pay | Admitting: Certified Registered Nurse Anesthetist

## 2019-12-30 DIAGNOSIS — E875 Hyperkalemia: Secondary | ICD-10-CM | POA: Diagnosis not present

## 2019-12-30 DIAGNOSIS — Z992 Dependence on renal dialysis: Secondary | ICD-10-CM | POA: Diagnosis not present

## 2019-12-30 DIAGNOSIS — E1110 Type 2 diabetes mellitus with ketoacidosis without coma: Secondary | ICD-10-CM | POA: Diagnosis not present

## 2019-12-30 DIAGNOSIS — B9562 Methicillin resistant Staphylococcus aureus infection as the cause of diseases classified elsewhere: Secondary | ICD-10-CM | POA: Diagnosis not present

## 2019-12-30 DIAGNOSIS — R7881 Bacteremia: Secondary | ICD-10-CM | POA: Diagnosis not present

## 2019-12-30 DIAGNOSIS — N186 End stage renal disease: Secondary | ICD-10-CM | POA: Diagnosis not present

## 2019-12-30 DIAGNOSIS — A419 Sepsis, unspecified organism: Secondary | ICD-10-CM | POA: Diagnosis not present

## 2019-12-30 LAB — CBC WITH DIFFERENTIAL/PLATELET
Abs Immature Granulocytes: 0 10*3/uL (ref 0.00–0.07)
Basophils Absolute: 0 10*3/uL (ref 0.0–0.1)
Basophils Relative: 0 %
Eosinophils Absolute: 0.2 10*3/uL (ref 0.0–0.5)
Eosinophils Relative: 1 %
HCT: 27.7 % — ABNORMAL LOW (ref 39.0–52.0)
Hemoglobin: 9.5 g/dL — ABNORMAL LOW (ref 13.0–17.0)
Lymphocytes Relative: 9 %
Lymphs Abs: 2.2 10*3/uL (ref 0.7–4.0)
MCH: 29.1 pg (ref 26.0–34.0)
MCHC: 34.3 g/dL (ref 30.0–36.0)
MCV: 84.7 fL (ref 80.0–100.0)
Monocytes Absolute: 1 10*3/uL (ref 0.1–1.0)
Monocytes Relative: 4 %
Neutro Abs: 21.4 10*3/uL — ABNORMAL HIGH (ref 1.7–7.7)
Neutrophils Relative %: 86 %
Platelets: 329 10*3/uL (ref 150–400)
RBC: 3.27 MIL/uL — ABNORMAL LOW (ref 4.22–5.81)
RDW: 16 % — ABNORMAL HIGH (ref 11.5–15.5)
WBC: 24.9 10*3/uL — ABNORMAL HIGH (ref 4.0–10.5)
nRBC: 0.3 % — ABNORMAL HIGH (ref 0.0–0.2)
nRBC: 1 /100 WBC — ABNORMAL HIGH

## 2019-12-30 LAB — RENAL FUNCTION PANEL
Albumin: 1.6 g/dL — ABNORMAL LOW (ref 3.5–5.0)
Anion gap: 21 — ABNORMAL HIGH (ref 5–15)
BUN: 79 mg/dL — ABNORMAL HIGH (ref 6–20)
CO2: 18 mmol/L — ABNORMAL LOW (ref 22–32)
Calcium: 8.6 mg/dL — ABNORMAL LOW (ref 8.9–10.3)
Chloride: 88 mmol/L — ABNORMAL LOW (ref 98–111)
Creatinine, Ser: 7.47 mg/dL — ABNORMAL HIGH (ref 0.61–1.24)
GFR calc Af Amer: 9 mL/min — ABNORMAL LOW (ref >60)
GFR calc non Af Amer: 7 mL/min — ABNORMAL LOW (ref >60)
Glucose, Bld: 147 mg/dL — ABNORMAL HIGH (ref 70–99)
Phosphorus: 6.3 mg/dL — ABNORMAL HIGH (ref 2.5–4.6)
Potassium: 4.9 mmol/L (ref 3.5–5.1)
Sodium: 127 mmol/L — ABNORMAL LOW (ref 135–145)

## 2019-12-30 LAB — GLUCOSE, CAPILLARY
Glucose-Capillary: 157 mg/dL — ABNORMAL HIGH (ref 70–99)
Glucose-Capillary: 152 mg/dL — ABNORMAL HIGH (ref 70–99)
Glucose-Capillary: 193 mg/dL — ABNORMAL HIGH (ref 70–99)
Glucose-Capillary: 153 mg/dL — ABNORMAL HIGH (ref 70–99)

## 2019-12-30 MED ORDER — PANTOPRAZOLE SODIUM 40 MG PO TBEC: 40.0000 mg | DELAYED_RELEASE_TABLET | Freq: Every day | ORAL | Status: DC

## 2019-12-30 MED ORDER — SODIUM CHLORIDE 0.9 % IV SOLN: INTRAVENOUS | Status: DC

## 2019-12-30 MED ORDER — VANCOMYCIN VARIABLE DOSE PER UNSTABLE RENAL FUNCTION (PHARMACIST DOSING): Status: DC

## 2019-12-30 MED ORDER — PANTOPRAZOLE SODIUM 40 MG PO TBEC
40.0000 mg | DELAYED_RELEASE_TABLET | Freq: Every day | ORAL | Status: DC
  Administered 2019-12-30 – 2020-01-04 (×4): 40 mg via ORAL
  Filled 2019-12-30 (×5): qty 1

## 2019-12-30 MED ORDER — ALUM & MAG HYDROXIDE-SIMETH 200-200-20 MG/5ML PO SUSP
30.0000 mL | Freq: Once | ORAL | Status: AC
  Administered 2019-12-30: 30 mL via ORAL
  Filled 2019-12-30: qty 30

## 2019-12-30 MED ORDER — LIDOCAINE VISCOUS HCL 2 % MT SOLN: 15.0000 mL | Freq: Once | OROMUCOSAL | Status: DC

## 2019-12-30 NOTE — Progress Notes (Addendum)
[name], male   DOB: [birth year], 54 y.o.   MRN: [id number]         [facility]  Date of Admission:  12/16/2019           Day 5 vancomycin ASSESSMENT: Mr. Blatz has MRSA bacteremia, most likely secondary to his dialysis catheter which was removed recently.  He had repeat blood cultures drawn this morning.  He needs a TEE to help establish optimal duration of IV vancomycin therapy.  PLAN: 1. Continue vancomycin 2. Await results of repeat blood cultures 3. Recommend TEE  Principal Problem:   MRSA bacteremia Active Problems:   Hyperlipidemia associated with type 2 diabetes mellitus (Seth [name])   ESRD (end stage renal disease) on dialysis ([name])   Absolute anemia   DM (diabetes mellitus) type 2, uncontrolled, with ketoacidosis ([name])   Hyperkalemia   Hyponatremia   Septic shock ([name])   Sepsis due to infected intravenous catheter ([name])   Wrist pain, acute, left   Scheduled Meds: . Chlorhexidine Gluconate Cloth  6 each Topical Q0600  . ferric citrate  420 mg Oral TID WC  . heparin  5,000 Units Subcutaneous Q8H  . insulin aspart  0-20 Units Subcutaneous TID WC  . midodrine  15 mg Oral TID WC   Continuous Infusions: . sodium chloride 250 mL (12/27/19 0436)  . sodium chloride    . sodium chloride    . norepinephrine (LEVOPHED) Adult infusion Stopped (12/27/19 1324)  . vancomycin Stopped (12/27/19 1700)   PRN Meds:.sodium chloride, sodium chloride, acetaminophen, alteplase, heparin, lidocaine (PF), lidocaine-prilocaine, pentafluoroprop-tetrafluoroeth    Review of Systems: Review of Systems  Unable to perform ROS: Mental acuity    No Known Allergies  OBJECTIVE: Vitals:   12/30/19 0000 12/30/19 0009 12/30/19 0403 12/30/19 0900  BP: 105/90  100/66 (!) 100/52  Pulse: 93 93 95 90  Resp: (!) 25 17 (!) 22 (!) 22  Temp: 99.5 F (37.5 C)  99 F (37.2 C) 98.8 F (37.1 C)  TempSrc: Axillary  Oral Oral  SpO2: 99% 98% 96% 95%    Weight:   135.1 kg   Height:       Body mass index is 45.29 kg/m.  Physical Exam Constitutional:      Comments: He is resting quietly in bed.  He appears confused.  Cardiovascular:     Rate and Rhythm: Normal rate and regular rhythm.     Heart sounds: No murmur.     Comments: Distant heart sounds. Pulmonary:     Effort: Pulmonary effort is normal.     Breath sounds: Normal breath sounds.  Chest:    Abdominal:     Palpations: Abdomen is soft.     Tenderness: There is no abdominal tenderness.  Musculoskeletal:        General: No swelling or tenderness.     Comments: Left upper arm fistula site looks normal.     Lab Results Lab Results  Component Value Date   WBC 24.9 (H) 12/30/2019   HGB 9.5 (L) 12/30/2019   HCT 27.7 (L) 12/30/2019   MCV 84.7 12/30/2019   PLT 329 12/30/2019    Lab Results  Component Value Date   CREATININE 7.47 (H) 12/30/2019   BUN 79 (H) 12/30/2019   NA 127 (L) 12/30/2019   K 4.9 12/30/2019   CL 88 (L) 12/30/2019   CO2 18 (L) 12/30/2019    Lab Results  Component Value Date   ALT 31 12/29/2019  AST 52 (H) 12/31/2019   ALKPHOS 213 (H) 12/09/2019   BILITOT 1.1 01/05/2020     Microbiology: Recent Results (from the past 240 hour(s))  SARS CORONAVIRUS 2 (TAT 6-24 HRS) Nasopharyngeal Nasopharyngeal Swab     Status: None   Collection Time: 12/22/19 11:45 PM   Specimen: Nasopharyngeal Swab  Result Value Ref Range Status   SARS Coronavirus 2 NEGATIVE NEGATIVE Final    Comment: (NOTE) SARS-CoV-2 target nucleic acids are NOT DETECTED. The SARS-CoV-2 RNA is generally detectable in upper and lower respiratory specimens during the acute phase of infection. Negative results do not preclude SARS-CoV-2 infection, do not rule out co-infections with other pathogens, and should not be used as the sole basis for treatment or other patient management decisions. Negative results must be combined with clinical observations, patient history, and  epidemiological information. The expected result is Negative. Fact Sheet for Patients: SugarRoll.be Fact Sheet for Healthcare Providers: https://www.woods-mathews.com/ This test is not yet approved or cleared by the [place] FDA and  has been authorized for detection and/or diagnosis of SARS-CoV-2 by FDA under an Emergency Use Authorization (EUA). This EUA will remain  in effect (meaning this test can be used) for the duration of the COVID-19 declaration under Section 56 4(b)(1) of the Act, 21 U.S.C. section 360bbb-3(b)(1), unless the authorization is terminated or revoked sooner. Performed at [facility], [place] [address], [place], [place] [address]   Blood Culture (routine x 2)     Status: Abnormal   Collection Time: 12/17/2019 10:15 PM   Specimen: BLOOD RIGHT ARM  Result Value Ref Range Status   Specimen Description BLOOD RIGHT ARM  Final   Special Requests   Final    BOTTLES DRAWN AEROBIC AND ANAEROBIC Blood Culture results may not be optimal due to an inadequate volume of blood received in culture bottles   Culture  Setup Time   Final    GRAM POSITIVE COCCI IN CLUSTERS IN BOTH AEROBIC AND ANAEROBIC BOTTLES CRITICAL RESULT CALLED TO, READ BACK BY AND VERIFIED WITH: J. Roselie Skinner, AT 1009 12/27/19 DV Performed at [facility], [place] [address], [place], [place] [address]    Culture METHICILLIN RESISTANT STAPHYLOCOCCUS AUREUS (A)  Final   Report Status 12/29/2019 FINAL  Final   Organism ID, Bacteria METHICILLIN RESISTANT STAPHYLOCOCCUS AUREUS  Final      Susceptibility   Methicillin resistant staphylococcus aureus - MIC*    CIPROFLOXACIN <=0.5 SENSITIVE Sensitive     ERYTHROMYCIN >=8 RESISTANT Resistant     GENTAMICIN <=0.5 SENSITIVE Sensitive     OXACILLIN >=4 RESISTANT Resistant     TETRACYCLINE <=1 SENSITIVE Sensitive     VANCOMYCIN 1 SENSITIVE Sensitive     TRIMETH/SULFA <=10 SENSITIVE Sensitive     CLINDAMYCIN <=0.25  SENSITIVE Sensitive     RIFAMPIN <=0.5 SENSITIVE Sensitive     Inducible Clindamycin NEGATIVE Sensitive     * METHICILLIN RESISTANT STAPHYLOCOCCUS AUREUS  Blood Culture (routine x 2)     Status: Abnormal   Collection Time: 01/02/2020 10:41 PM   Specimen: BLOOD RIGHT HAND  Result Value Ref Range Status   Specimen Description BLOOD RIGHT HAND  Final   Special Requests AEROBIC BOTTLE ONLY Blood Culture adequate volume  Final   Culture  Setup Time   Final    GRAM POSITIVE COCCI IN CLUSTERS AEROBIC BOTTLE ONLY CRITICAL VALUE NOTED.  VALUE IS CONSISTENT WITH PREVIOUSLY REPORTED AND CALLED VALUE.    Culture (A)  Final  STAPHYLOCOCCUS AUREUS SUSCEPTIBILITIES PERFORMED ON PREVIOUS CULTURE WITHIN THE LAST 5 DAYS. Performed at [facility], [place] [address], [place], [place] [address]    Report Status 12/29/2019 FINAL  Final  Blood Culture ID Panel (Reflexed)     Status: Abnormal   Collection Time: 12/09/2019 10:41 PM  Result Value Ref Range Status   Enterococcus species NOT DETECTED NOT DETECTED Final   Listeria monocytogenes NOT DETECTED NOT DETECTED Final   Staphylococcus species DETECTED (A) NOT DETECTED Final    Comment: CRITICAL RESULT CALLED TO, READ BACK BY AND VERIFIED WITH: Andres Shad PharmD 14:30 12/27/19 ([email or web address])    Staphylococcus aureus (BCID) DETECTED (A) NOT DETECTED Final    Comment: Methicillin (oxacillin)-resistant Staphylococcus aureus (MRSA). MRSA is predictably resistant to beta-lactam antibiotics (except ceftaroline). Preferred therapy is vancomycin unless clinically contraindicated. Patient requires contact precautions if  hospitalized. CRITICAL RESULT CALLED TO, READ BACK BY AND VERIFIED WITH: Andres Shad PharmD 14:30 12/27/19 ([email or web address])    Methicillin resistance DETECTED (A) NOT DETECTED Final    Comment: CRITICAL RESULT CALLED TO, READ BACK BY AND VERIFIED WITH: Andres Shad PharmD 14:30 12/27/19 ([email or web address])    Streptococcus species NOT DETECTED NOT DETECTED Final    Streptococcus agalactiae NOT DETECTED NOT DETECTED Final   Streptococcus pneumoniae NOT DETECTED NOT DETECTED Final   Streptococcus pyogenes NOT DETECTED NOT DETECTED Final   Acinetobacter baumannii NOT DETECTED NOT DETECTED Final   Enterobacteriaceae species NOT DETECTED NOT DETECTED Final   Enterobacter cloacae complex NOT DETECTED NOT DETECTED Final   Escherichia coli NOT DETECTED NOT DETECTED Final   Klebsiella oxytoca NOT DETECTED NOT DETECTED Final   Klebsiella pneumoniae NOT DETECTED NOT DETECTED Final   Proteus species NOT DETECTED NOT DETECTED Final   Serratia marcescens NOT DETECTED NOT DETECTED Final   Haemophilus influenzae NOT DETECTED NOT DETECTED Final   Neisseria meningitidis NOT DETECTED NOT DETECTED Final   Pseudomonas aeruginosa NOT DETECTED NOT DETECTED Final   Candida albicans NOT DETECTED NOT DETECTED Final   Candida glabrata NOT DETECTED NOT DETECTED Final   Candida krusei NOT DETECTED NOT DETECTED Final   Candida parapsilosis NOT DETECTED NOT DETECTED Final   Candida tropicalis NOT DETECTED NOT DETECTED Final    Comment: Performed at [facility], [place]. [address], [place], [place] [address]  Respiratory Panel by RT PCR (Flu A&B, Covid) - Nasopharyngeal Swab     Status: None   Collection Time: 12/14/2019 11:22 PM   Specimen: Nasopharyngeal Swab  Result Value Ref Range Status   SARS Coronavirus 2 by RT PCR NEGATIVE NEGATIVE Final    Comment: (NOTE) SARS-CoV-2 target nucleic acids are NOT DETECTED. The SARS-CoV-2 RNA is generally detectable in upper respiratoy specimens during the acute phase of infection. The lowest concentration of SARS-CoV-2 viral copies this assay can detect is 131 copies/mL. A negative result does not preclude SARS-Cov-2 infection and should not be used as the sole basis for treatment or other patient management decisions. A negative result may occur with  improper specimen collection/handling, submission of specimen other than  nasopharyngeal swab, presence of viral mutation(s) within the areas targeted by this assay, and inadequate number of viral copies (<131 copies/mL). A negative result must be combined with clinical observations, patient history, and epidemiological information. The expected result is Negative. Fact Sheet for Patients:  PinkCheek.be Fact Sheet for Healthcare Providers:  GravelBags.it This test is not yet ap proved or cleared by the [place] FDA and  has been authorized for detection and/or diagnosis  of SARS-CoV-2 by FDA under an Emergency Use Authorization (EUA). This EUA will remain  in effect (meaning this test can be used) for the duration of the COVID-19 declaration under Section 564(b)(1) of the Act, 21 U.S.C. section 360bbb-3(b)(1), unless the authorization is terminated or revoked sooner.    Influenza A by PCR NEGATIVE NEGATIVE Final   Influenza B by PCR NEGATIVE NEGATIVE Final    Comment: (NOTE) The Xpert Xpress SARS-CoV-2/FLU/RSV assay is intended as an aid in  the diagnosis of influenza from Nasopharyngeal swab specimens and  should not be used as a sole basis for treatment. Nasal washings and  aspirates are unacceptable for Xpert Xpress SARS-CoV-2/FLU/RSV  testing. Fact Sheet for Patients: PinkCheek.be Fact Sheet for Healthcare Providers: GravelBags.it This test is not yet approved or cleared by the [place] FDA and  has been authorized for detection and/or diagnosis of SARS-CoV-2 by  FDA under an Emergency Use Authorization (EUA). This EUA will remain  in effect (meaning this test can be used) for the duration of the  Covid-19 declaration under Section 564(b)(1) of the Act, 21  U.S.C. section 360bbb-3(b)(1), unless the authorization is  terminated or revoked. Performed at [facility], [place] [address], [place], [place] [address]   MRSA PCR  Screening     Status: Abnormal   Collection Time: 12/27/19  5:30 AM   Specimen: Nasopharyngeal  Result Value Ref Range Status   MRSA by PCR POSITIVE (A) NEGATIVE Final    Comment:        The GeneXpert MRSA Assay (FDA approved for NASAL specimens only), is one component of a comprehensive MRSA colonization surveillance program. It is not intended to diagnose MRSA infection nor to guide or monitor treatment for MRSA infections. RESULT CALLED TO, READ BACK BY AND VERIFIED WITH: RN Sarajane Jews [id number] FCP Performed at [facility] [address] [place], [place] [address]   Culture, blood (routine x 2)     Status: None (Preliminary result)   Collection Time: 12/28/19  5:40 AM   Specimen: BLOOD RIGHT HAND  Result Value Ref Range Status   Specimen Description BLOOD RIGHT HAND  Final   Special Requests   Final    BOTTLES DRAWN AEROBIC AND ANAEROBIC Blood Culture adequate volume   Culture   Final    NO GROWTH 1 DAY Performed at [facility], [place] [address] [place], [place] [address]    Report Status PENDING  Incomplete  Culture, blood (routine x 2)     Status: Abnormal   Collection Time: 12/28/19  5:59 AM   Specimen: BLOOD RIGHT HAND  Result Value Ref Range Status   Specimen Description BLOOD RIGHT HAND  Final   Special Requests IN PEDIATRIC BOTTLE Blood Culture adequate volume  Final   Culture  Setup Time   Final    GRAM POSITIVE COCCI IN PEDIATRIC BOTTLE CRITICAL RESULT CALLED TO, READ BACK BY AND VERIFIED WITH: PHARMD LINDSAY CHEN @2021  12/28/2019 AKT    Culture (A)  Final    STAPHYLOCOCCUS AUREUS SUSCEPTIBILITIES PERFORMED ON PREVIOUS CULTURE WITHIN THE LAST 5 DAYS. Performed at [facility], [place] [address] [place],  [address]    Report Status 12/29/2019 FINAL  Final    Michel Bickers, MD [facility] [phone] pager   [phone] cell 12/30/2019, 10:22 AM

## 2019-12-30 NOTE — Progress Notes (Signed)
[facility] Progress Note   Assessment/ Plan:    1. Septic shock 2/2 MRSA bacteremia: (3rd episode in past year) on vancomycin, off pressors now, BP still a little soft but feeling much better.  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  TDC has been removed (think that's the source).  Has back pain- getting MRI, couldn't tolerate.  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  Repeat blood cultures + 12/28/2018, 3/22 pending, will need endocarditis eval, will defer to ID for recs.  MRI back no e/o infection (suboptimal due to motion).  Using AVF without difficulty, no need for another [facility] at present.  2.  ESRD: MWF [facility]-- missed 2 sessions, HD yesterday 3.20, next planned for Monday.  Had used AVF 6x as OP, used 2 16 g needles successfully yesterday.  Will try for 15 g needles today 3/22.  Using midodrine to augment BP, increased to 15 mg TID.     3.  DM II: per primary  4.  Hyponatremia: improving with HD - set Na 135 today. On fluid restriction and serum glucose ok. CTM.  5.  Anemia: hgb 9.5, no IV iron in setting of bacteremia.  Add on low dose aranesp tomorrow if still < 10.   6. BMMD: Phos up to 6.1, added back Auryxia  7.  Dispo: admitted  Subjective:     Plan for HD today using 15 g needles.  No complaints. Denies dyspnea.    Objective:   BP (!) 100/52 (BP Location: Right Wrist)   Pulse 90   Temp 98.8 F (37.1 C) (Oral)   Resp (!) 22   Ht 5\' 8"  (1.727 m)   Wt 135.1 kg   SpO2 95%   BMI 45.29 kg/m   Physical Exam: GEN NAD, sleeping HEENT EOMI PERRL NECK no JVD CHEST: TDC site dressed PULM clear bilaterally no c/w/r CV RRR ABD obese EXT no ankle edema NEURO AAO x 3 conversant SKIN no rashes MSK L wrist as above ACCESS: LUE AVF +T/B, resolving infiltration - sizeable lateral hematoma  Labs: BMET Recent Labs  Lab 12/23/2019 2220 12/15/2019 2245 12/27/19 0434 12/27/19 2253 12/28/19 0818 12/29/19 0751 12/30/19 0644  NA 125* 124* 128*  123* 125* 129* 127*  K 5.6* 5.4* 5.3* 5.5* 6.3* 4.7 4.9  CL 86*  --  89* 87* 87* 90* 88*  CO2 17*  --  17* 17* 19* 20* 18*  GLUCOSE 137*  --  115* 120* 114* 135* 147*  BUN 62*  --  62* 74* 81* 53* 79*  CREATININE 10.21*  --  9.93* 10.50* 10.97* 6.56* 7.47*  CALCIUM 8.3*  --  8.2* 7.6* 7.6* 8.7* 8.6*  PHOS  --   --  6.0* 6.1* 7.0* 6.1* 6.3*   CBC Recent Labs  Lab 12/23/2019 2220 12/29/2019 2245 12/27/19 2253 12/28/19 0818 12/29/19 0751 12/30/19 0644  WBC 19.7*   < > 13.5* 18.0* 19.9* 24.9*  NEUTROABS 17.5*  --   --  15.1* 16.5* 21.4*  HGB 11.3*   < > 11.4* 10.7* 10.5* 9.5*  HCT 35.4*   < > 34.8* 32.2* 31.1* 27.7*  MCV 92.2   < > 89.9 87.3 85.9 84.7  PLT 250   < > 247 273 289 329   < > = values in this interval not displayed.      Medications:    . Chlorhexidine Gluconate Cloth  6 each Topical Q0600  . ferric citrate  420 mg Oral TID  WC  . heparin  5,000 Units Subcutaneous Q8H  . insulin aspart  0-20 Units Subcutaneous TID WC  . midodrine  15 mg Oral TID WC

## 2019-12-30 NOTE — Plan of Care (Signed)
  Problem: Education: Goal: Knowledge of General Education information will improve Description: Including pain rating scale, medication(s)/side effects and non-pharmacologic comfort measures Outcome: Progressing   Problem: Health Behavior/Discharge Planning: Goal: Ability to manage health-related needs will improve Outcome: Progressing   Problem: Clinical Measurements: Goal: Ability to maintain clinical measurements within normal limits will improve Outcome: Progressing Goal: Will remain free from infection Outcome: Progressing Goal: Diagnostic test results will improve Outcome: Progressing Goal: Respiratory complications will improve Outcome: Progressing Goal: Cardiovascular complication will be avoided Outcome: Progressing   Problem: Nutrition: Goal: Adequate nutrition will be maintained Outcome: Progressing   Problem: Coping: Goal: Level of anxiety will decrease Outcome: Progressing   Problem: Elimination: Goal: Will not experience complications related to bowel motility Outcome: Progressing Goal: Will not experience complications related to urinary retention Outcome: Progressing   Problem: Pain Managment: Goal: General experience of comfort will improve Outcome: Progressing   Problem: Safety: Goal: Ability to remain free from injury will improve Outcome: Progressing   Problem: Skin Integrity: Goal: Risk for impaired skin integrity will decrease Outcome: Progressing   Problem: Fluid Volume: Goal: Hemodynamic stability will improve Outcome: Progressing   Problem: Clinical Measurements: Goal: Diagnostic test results will improve Outcome: Progressing Goal: Signs and symptoms of infection will decrease Outcome: Progressing   Problem: Respiratory: Goal: Ability to maintain adequate ventilation will improve Outcome: Progressing

## 2019-12-30 NOTE — Progress Notes (Signed)
Patient on PPV overnight with 4 liters of O2.

## 2019-12-30 NOTE — Progress Notes (Signed)
    [facility] has been requested to perform a transesophageal echocardiogram on Seth [name] for bacteremia.  After careful review of history and examination, the risks and benefits of transesophageal echocardiogram have been explained including risks of esophageal damage, perforation (1:10,000 risk), bleeding, pharyngeal hematoma as well as other potential complications associated with conscious sedation including aspiration, arrhythmia, respiratory failure and death. Alternatives to treatment were discussed, questions were answered. Patient is willing to proceed.    Kathyrn Drown, NP  12/30/2019 3:24 PM

## 2019-12-30 NOTE — Progress Notes (Signed)
PROGRESS NOTE    Seth [name]  INO:[id number] DOB: 07-03-1966 DOA: 01/02/2020 PCP: Mitzi Hansen, MD    Brief Narrative: HPI per Dr. Halford Chessman 54 year old male with PMH as below, which is significant for ESRD on HD, chronic hypoxemic respiratory failure no home O2, OSA on CPAP, DM, discitis, and cord compression myelopathy. Most recent HD was 3/17. He left the session 45 mins early due to back pain. He was in his usual state of health until approximately 3/11 when he developed muscle aches and back pain. He had just received his 1st COVID-19 vaccine a few days prior and it was felt these symptoms were secondary to this. He has been seen twice in the ED in the past week. First for complaints of febrile illness. He was not given any antibiotics at that time. He then returned 3/18 with complaints of myalgias. He returned to ED later in the day 3/18 via EMS after being found to have altered mental status at home. Upon arrival to ED he was noted to be hypotensive with SBP in the 60s and hypoxemic with O2 sats in the 70s on room air. Improved with supplemental O2 and levophed infusion after 263m bolus. There were also concerns he had been unable to use his legs the past few days and MRI spine was ordered. PCCM asked to evaluate for admission due to need for vasoactive infusion.   Assessment & Plan:   Principal Problem:   Septic shock ([facility] Active Problems:   Bacteremia   Hyperlipidemia associated with type 2 diabetes mellitus ([facility]   ESRD (end stage renal disease) on dialysis ([name])   Absolute anemia   DM (diabetes mellitus) type 2, uncontrolled, with ketoacidosis ([name])   Hyperkalemia   Hyponatremia   Sepsis due to infected intravenous catheter ([name])   Wrist pain, acute, left  1 septic shock secondary to MRSA bacteremia secondary to infected HD catheter/ Patient had presented noted to be in septic shock requiring pressors felt secondary to infected HD catheter.  Infected HD catheter  removed 12/27/2019 with purulence noted.  Patient currently off pressors.  Systolic blood pressure low 90s-100s.  MRSA PCR positive.  Blood cultures positive for MRSA.  2D echo negative for any vegetations/endocarditis.   Repeat blood cultures from 12/28/2019 + for staph aureus.  Will likely repeat blood cultures in approximately 24-48 hours however will defer timing to ID.  Patient needs a TEE per ID to rule out endocarditis.  Currently on midodrine dose increased to 15 mg 3 times daily per nephrology.  Continue IV vancomycin.  Per ID.  2.  Hyperkalemia/hyponatremia/acidosis/hyperphosphatemia Secondary to end-stage renal disease.  Status post Lokelma.  Potassium at 4.9 this morning.  Sodium levels fluctuating currently at 127.  Phosphorus at 6.3.  Patient with end-stage renal disease on hemodialysis.  Per nephrology.   3.  Lower extremity weakness Patient still with lower extremity weakness with slight improvement.  Initially unable to obtain MRI due to claustrophobia however was given some Ativan and MRI obtained earlier this morning 12/29/2019.  MRI of the L-spine obtained with no acute abnormality or evidence for infection within the lumbar spine, technically limited exam due to motion artifact.  10 mm anterior listhesis of L5 on S1 with associated moderate bilateral subarticular stenosis, with moderate to severe bilateral L5 foraminal narrowing.  Right foraminal to extraforaminal disc protrusion at L3-4 potentially affecting the exiting right L3 nerve root.  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  PT/OT.   4.  Left wrist pain Plain films of the left wrist negative for any acute abnormalities.  Supportive care.  Follow.  5.  Diabetes mellitus II Hemoglobin A1c of 7.1.  CBG of 157 this morning.  Continue sliding scale insulin.  6.  End-stage renal disease Patient being followed by nephrology.  Patient for hemodialysis today.  7.   OSA CPAP  8.  Hypertension Patient presented in septic shock.  Systolic blood pressure is soft in the 90s-100s.  Midodrine dose increased per nephrology to 15 mg 3 times daily.  Norvasc on hold.    DVT prophylaxis: Heparin Code Status: Full Family Communication: Updated patient.   Disposition Plan:  . Patient came from: Home            . Anticipated d/c place: Home . Barriers to d/c OR conditions which need to be met to effect a safe d/c: Likely home with home health versus SNF pending PT evaluation, once clinically improved, improvement with hypotension/sepsis, electrolyte abnormalities corrected, cleared by ID and nephrology.   Consultants:   Patient admitted to critical care service  ID: Dr.Comer 12/27/2019  Nephrology: Dr. Hollie Salk 12/27/2019  Procedures:   CT left wrist 12/27/2019  CT angiogram chest 12/10/2019  Plain films of the left wrist 12/27/2019  Chest x-ray 01/04/2020  Incomplete MRI T and L-spine 12/27/2019--- patient could not tolerate due to claustrophobia.  2D echo 12/27/2019  Successful removal of tunneled left IJ hemodialysis catheter with purulence expressed from tract per Ascencion Dike, PA interventional radiology 12/27/2019  Antimicrobials:  IV Zosyn 12/27/2019>>>> 12/27/2019  IV vancomycin 12/30/2019   Subjective: Patient laying in bed.  States he feels weakness in legs is slowly improving.  Alert and oriented to self place and time.  Denies chest pain.  Denies shortness of breath.   Objective: Vitals:   12/30/19 0000 12/30/19 0009 12/30/19 0403 12/30/19 0900  BP: 105/90  100/66 (!) 100/52  Pulse: 93 93 95 90  Resp: (!) 25 17 (!) 22 (!) 22  Temp: 99.5 F (37.5 C)  99 F (37.2 C) 98.8 F (37.1 C)  TempSrc: Axillary  Oral Oral  SpO2: 99% 98% 96% 95%  Weight:   135.1 kg   Height:        Intake/Output Summary (Last 24 hours) at 12/30/2019 0915 Last data filed at 12/29/2019 1700 Gross per 24 hour  Intake 720 ml  Output --  Net 720 ml   Filed  Weights   12/28/19 1023 12/28/19 1452 12/30/19 0403  Weight: 129.8 kg 128.8 kg 135.1 kg    Examination:  General exam: NAD.  Respiratory system: Clear to auscultation bilaterally.  No wheezes, no crackles, no rhonchi.  Normal respiratory effort.  Cardiovascular system: RRR no murmurs rubs or gallops.  No JVD.  No lower extremity edema.   Gastrointestinal system: Abdomen is nontender, nondistended, soft, positive bowel sounds.  No rebound.  No guarding.  Central nervous system: Alert and oriented. No focal neurological deficits. Extremities: 3-4/5 bilateral lower extremity strength.  Skin: No rashes, lesions or ulcers Psychiatry: Judgement and insight appear normal. Mood & affect appropriate.     Data Reviewed: I have personally reviewed following labs and imaging studies  CBC: Recent Labs  Lab 12/13/2019 2220 12/18/2019 2245 12/27/19 0434 12/27/19 2253 12/28/19 0818 12/29/19 0751 12/30/19 0644  WBC 19.7*   < > 22.8* 13.5* 18.0* 19.9* 24.9*  NEUTROABS 17.5*  --   --   --  15.1* 16.5* 21.4*  HGB 11.3*   < >  10.9* 11.4* 10.7* 10.5* 9.5*  HCT 35.4*   < > 34.2* 34.8* 32.2* 31.1* 27.7*  MCV 92.2   < > 92.2 89.9 87.3 85.9 84.7  PLT 250   < > 241 247 273 289 329   < > = values in this interval not displayed.   Basic Metabolic Panel: Recent Labs  Lab 12/27/19 0434 12/27/19 2253 12/28/19 0818 12/29/19 0751 12/30/19 0644  NA 128* 123* 125* 129* 127*  K 5.3* 5.5* 6.3* 4.7 4.9  CL 89* 87* 87* 90* 88*  CO2 17* 17* 19* 20* 18*  GLUCOSE 115* 120* 114* 135* 147*  BUN 62* 74* 81* 53* 79*  CREATININE 9.93* 10.50* 10.97* 6.56* 7.47*  CALCIUM 8.2* 7.6* 7.6* 8.7* 8.6*  MG 1.6*  --  2.0 1.9  --   PHOS 6.0* 6.1* 7.0* 6.1* 6.3*   GFR: Estimated Creatinine Clearance: 15 mL/min (A) (by C-G formula based on SCr of 7.47 mg/dL (H)). Liver Function Tests: Recent Labs  Lab 01/06/2020 2220 12/27/19 2253 12/28/19 0818 12/29/19 0751 12/30/19 0644  AST 52*  --   --   --   --   ALT 31  --    --   --   --   ALKPHOS 213*  --   --   --   --   BILITOT 1.1  --   --   --   --   PROT 7.4  --   --   --   --   ALBUMIN 2.2* 1.8* 1.8* 1.7* 1.6*   No results for input(s): LIPASE, AMYLASE in the last 168 hours. No results for input(s): AMMONIA in the last 168 hours. Coagulation Profile: Recent Labs  Lab 12/27/19 0010  INR 1.4*   Cardiac Enzymes: No results for input(s): CKTOTAL, CKMB, CKMBINDEX, TROPONINI in the last 168 hours. BNP (last 3 results) No results for input(s): PROBNP in the last 8760 hours. HbA1C: No results for input(s): HGBA1C in the last 72 hours. CBG: Recent Labs  Lab 12/29/19 0757 12/29/19 1150 12/29/19 1606 12/29/19 2135 12/30/19 0903  GLUCAP 134* 149* 142* 143* 157*   Lipid Profile: No results for input(s): CHOL, HDL, LDLCALC, TRIG, CHOLHDL, LDLDIRECT in the last 72 hours. Thyroid Function Tests: No results for input(s): TSH, T4TOTAL, FREET4, T3FREE, THYROIDAB in the last 72 hours. Anemia Panel: No results for input(s): VITAMINB12, FOLATE, FERRITIN, TIBC, IRON, RETICCTPCT in the last 72 hours. Sepsis Labs: Recent Labs  Lab 12/31/2019 2244 12/27/19 0020 12/27/19 0434  PROCALCITON  --   --  >150.00  LATICACIDVEN 4.1* 2.4*  --     Recent Results (from the past 240 hour(s))  SARS CORONAVIRUS 2 (TAT 6-24 HRS) Nasopharyngeal Nasopharyngeal Swab     Status: None   Collection Time: 12/22/19 11:45 PM   Specimen: Nasopharyngeal Swab  Result Value Ref Range Status   SARS Coronavirus 2 NEGATIVE NEGATIVE Final    Comment: (NOTE) SARS-CoV-2 target nucleic acids are NOT DETECTED. The SARS-CoV-2 RNA is generally detectable in upper and lower respiratory specimens during the acute phase of infection. Negative results do not preclude SARS-CoV-2 infection, do not rule out co-infections with other pathogens, and should not be used as the sole basis for treatment or other patient management decisions. Negative results must be combined with clinical  observations, patient history, and epidemiological information. The expected result is Negative. Fact Sheet for Patients: SugarRoll.be Fact Sheet for Healthcare Providers: https://www.woods-mathews.com/ This test is not yet approved or cleared by the [place] FDA and  has been authorized for detection and/or diagnosis of SARS-CoV-2 by FDA under an Emergency Use Authorization (EUA). This EUA will remain  in effect (meaning this test can be used) for the duration of the COVID-19 declaration under Section 56 4(b)(1) of the Act, 21 U.S.C. section 360bbb-3(b)(1), unless the authorization is terminated or revoked sooner. Performed at [facility], [place] [address] [place], [place] [address]   Blood Culture (routine x 2)     Status: Abnormal   Collection Time: 01/04/2020 10:15 PM   Specimen: BLOOD RIGHT ARM  Result Value Ref Range Status   Specimen Description BLOOD RIGHT ARM  Final   Special Requests   Final    BOTTLES DRAWN AEROBIC AND ANAEROBIC Blood Culture results may not be optimal due to an inadequate volume of blood received in culture bottles   Culture  Setup Time   Final    GRAM POSITIVE COCCI IN CLUSTERS IN BOTH AEROBIC AND ANAEROBIC BOTTLES CRITICAL RESULT CALLED TO, READ BACK BY AND VERIFIED WITH: J. Roselie Skinner, AT 1009 12/27/19 DV Performed at [facility], [place] [address], [place], [place] [address]    Culture METHICILLIN RESISTANT STAPHYLOCOCCUS AUREUS (A)  Final   Report Status 12/29/2019 FINAL  Final   Organism ID, Bacteria METHICILLIN RESISTANT STAPHYLOCOCCUS AUREUS  Final      Susceptibility   Methicillin resistant staphylococcus aureus - MIC*    CIPROFLOXACIN <=0.5 SENSITIVE Sensitive     ERYTHROMYCIN >=8 RESISTANT Resistant     GENTAMICIN <=0.5 SENSITIVE Sensitive     OXACILLIN >=4 RESISTANT Resistant     TETRACYCLINE <=1 SENSITIVE Sensitive     VANCOMYCIN 1 SENSITIVE Sensitive     TRIMETH/SULFA <=10  SENSITIVE Sensitive     CLINDAMYCIN <=0.25 SENSITIVE Sensitive     RIFAMPIN <=0.5 SENSITIVE Sensitive     Inducible Clindamycin NEGATIVE Sensitive     * METHICILLIN RESISTANT STAPHYLOCOCCUS AUREUS  Blood Culture (routine x 2)     Status: Abnormal   Collection Time: 12/12/2019 10:41 PM   Specimen: BLOOD RIGHT HAND  Result Value Ref Range Status   Specimen Description BLOOD RIGHT HAND  Final   Special Requests AEROBIC BOTTLE ONLY Blood Culture adequate volume  Final   Culture  Setup Time   Final    GRAM POSITIVE COCCI IN CLUSTERS AEROBIC BOTTLE ONLY CRITICAL VALUE NOTED.  VALUE IS CONSISTENT WITH PREVIOUSLY REPORTED AND CALLED VALUE.    Culture (A)  Final    STAPHYLOCOCCUS AUREUS SUSCEPTIBILITIES PERFORMED ON PREVIOUS CULTURE WITHIN THE LAST 5 DAYS. Performed at [facility], [place] [address], [place] ,  [address]    Report Status 12/29/2019 FINAL  Final  Blood Culture ID Panel (Reflexed)     Status: Abnormal   Collection Time: 12/25/2019 10:41 PM  Result Value Ref Range Status   Enterococcus species NOT DETECTED NOT DETECTED Final   Listeria monocytogenes NOT DETECTED NOT DETECTED Final   Staphylococcus species DETECTED (A) NOT DETECTED Final    Comment: CRITICAL RESULT CALLED TO, READ BACK BY AND VERIFIED WITH: Andres Shad PharmD 14:30 12/27/19 ([email or web address])    Staphylococcus aureus (BCID) DETECTED (A) NOT DETECTED Final    Comment: Methicillin (oxacillin)-resistant Staphylococcus aureus (MRSA). MRSA is predictably resistant to beta-lactam antibiotics (except ceftaroline). Preferred therapy is vancomycin unless clinically contraindicated. Patient requires contact precautions if  hospitalized. CRITICAL RESULT CALLED TO, READ BACK BY AND VERIFIED WITH: Andres Shad PharmD 14:30 12/27/19 ([email or web address])    Methicillin resistance DETECTED (A) NOT DETECTED Final  Comment: CRITICAL RESULT CALLED TO, READ BACK BY AND VERIFIED WITH: Andres Shad PharmD 14:30 12/27/19 ([email or web address])    Streptococcus  species NOT DETECTED NOT DETECTED Final   Streptococcus agalactiae NOT DETECTED NOT DETECTED Final   Streptococcus pneumoniae NOT DETECTED NOT DETECTED Final   Streptococcus pyogenes NOT DETECTED NOT DETECTED Final   Acinetobacter baumannii NOT DETECTED NOT DETECTED Final   Enterobacteriaceae species NOT DETECTED NOT DETECTED Final   Enterobacter cloacae complex NOT DETECTED NOT DETECTED Final   Escherichia coli NOT DETECTED NOT DETECTED Final   Klebsiella oxytoca NOT DETECTED NOT DETECTED Final   Klebsiella pneumoniae NOT DETECTED NOT DETECTED Final   Proteus species NOT DETECTED NOT DETECTED Final   Serratia marcescens NOT DETECTED NOT DETECTED Final   Haemophilus influenzae NOT DETECTED NOT DETECTED Final   Neisseria meningitidis NOT DETECTED NOT DETECTED Final   Pseudomonas aeruginosa NOT DETECTED NOT DETECTED Final   Candida albicans NOT DETECTED NOT DETECTED Final   Candida glabrata NOT DETECTED NOT DETECTED Final   Candida krusei NOT DETECTED NOT DETECTED Final   Candida parapsilosis NOT DETECTED NOT DETECTED Final   Candida tropicalis NOT DETECTED NOT DETECTED Final    Comment: Performed at [facility], [place] [address], [place], [place] [address]  Respiratory Panel by RT PCR (Flu A&B, Covid) - Nasopharyngeal Swab     Status: None   Collection Time: 12/25/2019 11:22 PM   Specimen: Nasopharyngeal Swab  Result Value Ref Range Status   SARS Coronavirus 2 by RT PCR NEGATIVE NEGATIVE Final    Comment: (NOTE) SARS-CoV-2 target nucleic acids are NOT DETECTED. The SARS-CoV-2 RNA is generally detectable in upper respiratoy specimens during the acute phase of infection. The lowest concentration of SARS-CoV-2 viral copies this assay can detect is 131 copies/mL. A negative result does not preclude SARS-Cov-2 infection and should not be used as the sole basis for treatment or other patient management decisions. A negative result may occur with  improper specimen collection/handling,  submission of specimen other than nasopharyngeal swab, presence of viral mutation(s) within the areas targeted by this assay, and inadequate number of viral copies (<131 copies/mL). A negative result must be combined with clinical observations, patient history, and epidemiological information. The expected result is Negative. Fact Sheet for Patients:  PinkCheek.be Fact Sheet for Healthcare Providers:  GravelBags.it This test is not yet ap proved or cleared by the [place] FDA and  has been authorized for detection and/or diagnosis of SARS-CoV-2 by FDA under an Emergency Use Authorization (EUA). This EUA will remain  in effect (meaning this test can be used) for the duration of the COVID-19 declaration under Section 564(b)(1) of the Act, 21 U.S.C. section 360bbb-3(b)(1), unless the authorization is terminated or revoked sooner.    Influenza A by PCR NEGATIVE NEGATIVE Final   Influenza B by PCR NEGATIVE NEGATIVE Final    Comment: (NOTE) The Xpert Xpress SARS-CoV-2/FLU/RSV assay is intended as an aid in  the diagnosis of influenza from Nasopharyngeal swab specimens and  should not be used as a sole basis for treatment. Nasal washings and  aspirates are unacceptable for Xpert Xpress SARS-CoV-2/FLU/RSV  testing. Fact Sheet for Patients: PinkCheek.be Fact Sheet for Healthcare Providers: GravelBags.it This test is not yet approved or cleared by the [place] FDA and  has been authorized for detection and/or diagnosis of SARS-CoV-2 by  FDA under an Emergency Use Authorization (EUA). This EUA will remain  in effect (meaning this test can be used) for the duration of  the  Covid-19 declaration under Section 564(b)(1) of the Act, 21  U.S.C. section 360bbb-3(b)(1), unless the authorization is  terminated or revoked. Performed at [facility], [place] [address]  [place], [place] [address]   MRSA PCR Screening     Status: Abnormal   Collection Time: 12/27/19  5:30 AM   Specimen: Nasopharyngeal  Result Value Ref Range Status   MRSA by PCR POSITIVE (A) NEGATIVE Final    Comment:        The GeneXpert MRSA Assay (FDA approved for NASAL specimens only), is one component of a comprehensive MRSA colonization surveillance program. It is not intended to diagnose MRSA infection nor to guide or monitor treatment for MRSA infections. RESULT CALLED TO, READ BACK BY AND VERIFIED WITH: RN Sarajane Jews [phone] FCP Performed at [facility] [address] [place], [place] [address]   Culture, blood (routine x 2)     Status: None (Preliminary result)   Collection Time: 12/28/19  5:40 AM   Specimen: BLOOD RIGHT HAND  Result Value Ref Range Status   Specimen Description BLOOD RIGHT HAND  Final   Special Requests   Final    BOTTLES DRAWN AEROBIC AND ANAEROBIC Blood Culture adequate volume   Culture   Final    NO GROWTH 1 DAY Performed at [facility], [place] [address], [place], [place] [address]    Report Status PENDING  Incomplete  Culture, blood (routine x 2)     Status: Abnormal   Collection Time: 12/28/19  5:59 AM   Specimen: BLOOD RIGHT HAND  Result Value Ref Range Status   Specimen Description BLOOD RIGHT HAND  Final   Special Requests IN PEDIATRIC BOTTLE Blood Culture adequate volume  Final   Culture  Setup Time   Final    GRAM POSITIVE COCCI IN PEDIATRIC BOTTLE CRITICAL RESULT CALLED TO, READ BACK BY AND VERIFIED WITH: PHARMD LINDSAY CHEN '@2021'$  12/28/2019 AKT    Culture (A)  Final    STAPHYLOCOCCUS AUREUS SUSCEPTIBILITIES PERFORMED ON PREVIOUS CULTURE WITHIN THE LAST 5 DAYS. Performed at [facility], [place] [address] [place], [place] [address]    Report Status 12/29/2019 FINAL  Final         Radiology Studies: MR LUMBAR SPINE WO CONTRAST  Result Date: 12/29/2019 CLINICAL DATA:  Initial evaluation for low back  pain, progressive neurologic deficit. EXAM: MRI LUMBAR SPINE WITHOUT CONTRAST TECHNIQUE: Multiplanar, multisequence MR imaging of the lumbar spine was performed. No intravenous contrast was administered. COMPARISON:  Prior MRI from 12/05/2018. FINDINGS: Segmentation: Examination technically limited by motion artifact and body habitus. Transitional lumbosacral anatomy with lumbarized S1 segment. Same numbering system employed as on previous exams. Alignment: 10 mm anterolisthesis of L5 on S1, chronic and facet mediated, and relatively stable from previous. No associated pars defect. Alignment otherwise normal with preservation of the normal lumbar lordosis. Vertebrae: Vertebral body height maintained without evidence for acute or chronic fracture. Diffusely decreased T1 and T2 signal intensity seen throughout the visualized bone marrow, likely related to end-stage renal disease. No focal or worrisome osseous lesions. No findings to suggest acute discitis or osteomyelitis. Conus medullaris and cauda equina: Conus extends to the L1-2 level. Conus and cauda equina appear normal. No epidural collections. Paraspinal and other soft tissues: Paraspinous soft tissues demonstrate no acute finding. Paraspinous muscular atrophy noted. Atrophic kidneys with suspected scattered renal cysts, not well assessed on this technically limited exam. Disc levels: T12-L1: Unremarkable. L1-2: Unremarkable. L2-3:  Unremarkable. L3-4: Mild diffuse disc bulge with disc desiccation. Superimposed right foraminal to extraforaminal disc protrusion contacts the exiting right L3 nerve root (series 5, image 3). Mild right foraminal stenosis. Central canal remains patent. No significant left foraminal narrowing. L4-5: Negative interspace. Mild epidural lipomatosis. No significant stenosis. L5-S1: 10 mm anterolisthesis of L5 on S1. Associated broad posterior pseudo disc bulge/uncovering. Associated annular fissure. Severe bilateral facet arthrosis.  Associated trace bilateral joint effusions, likely degenerative. Epidural lipomatosis. Resultant moderate canal with bilateral lateral recess stenosis, worse on the right. Moderate to severe bilateral L5 foraminal narrowing, similar to previous. S1-2 rudimentary S1-S2 interspace. No significant disc bulge. Epidural lipomatosis. No stenosis. IMPRESSION: 1. Technically limited exam due to motion artifact. No acute abnormality or evidence for infection within the lumbar spine. 2. 10 mm anterolisthesis of L5 on S1 with associated moderate bilateral subarticular stenosis, with moderate to severe bilateral L5 foraminal narrowing. 3. Right foraminal to extraforaminal disc protrusion at L3-4, potentially affecting the exiting right L3 nerve root. 4. 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 Electronically Signed   By: Jeannine Boga M.D.   On: 12/29/2019 03:26        Scheduled Meds: . Chlorhexidine Gluconate Cloth  6 each Topical Q0600  . ferric citrate  420 mg Oral TID WC  . heparin  5,000 Units Subcutaneous Q8H  . insulin aspart  0-20 Units Subcutaneous TID WC  . midodrine  15 mg Oral TID WC   Continuous Infusions: . sodium chloride 250 mL (12/27/19 0436)  . sodium chloride    . sodium chloride    . norepinephrine (LEVOPHED) Adult infusion Stopped (12/27/19 1324)  . vancomycin Stopped (12/27/19 1700)     LOS: 3 days    Time spent: 35 minutes    Irine Seal, MD [facility]   To contact the attending provider between 7A-7P or the covering provider during after hours 7P-7A, please log into the web site [email or web address] and access using universal [place] password for that web site. If you do not have the password, please call the hospital operator.  12/30/2019, 9:15 AM

## 2019-12-30 NOTE — Progress Notes (Signed)
Pt not able to get dialysis today d/t access issues. Pt has Vancomycin scheduled during dialysis.  Pharmacy consulted for whether or not to administer medication.  Pharmacy will call RN back after figuring out.

## 2019-12-30 NOTE — Care Management Important Message (Signed)
Important Message  Patient Details  Name: Seth [name] MRN: [id number] Date of Birth: 1966/08/10   Medicare Important Message Given:  Yes     Orbie Pyo 12/30/2019, 12:09 PM

## 2019-12-31 ENCOUNTER — Encounter (HOSPITAL_COMMUNITY): Payer: Medicare Other

## 2019-12-31 ENCOUNTER — Other Ambulatory Visit (HOSPITAL_COMMUNITY): Payer: Medicare Other

## 2019-12-31 ENCOUNTER — Encounter (HOSPITAL_COMMUNITY): Payer: Self-pay | Admitting: Internal Medicine

## 2019-12-31 DIAGNOSIS — A419 Sepsis, unspecified organism: Secondary | ICD-10-CM | POA: Diagnosis not present

## 2019-12-31 DIAGNOSIS — N186 End stage renal disease: Secondary | ICD-10-CM | POA: Diagnosis not present

## 2019-12-31 DIAGNOSIS — E1110 Type 2 diabetes mellitus with ketoacidosis without coma: Secondary | ICD-10-CM | POA: Diagnosis not present

## 2019-12-31 DIAGNOSIS — E875 Hyperkalemia: Secondary | ICD-10-CM | POA: Diagnosis not present

## 2019-12-31 LAB — CBC WITH DIFFERENTIAL/PLATELET
Abs Immature Granulocytes: 0.6 10*3/uL — ABNORMAL HIGH (ref 0.00–0.07)
Basophils Absolute: 0 10*3/uL (ref 0.0–0.1)
Basophils Relative: 0 %
Eosinophils Absolute: 1.2 10*3/uL — ABNORMAL HIGH (ref 0.0–0.5)
Eosinophils Relative: 4 %
HCT: 26.9 % — ABNORMAL LOW (ref 39.0–52.0)
Hemoglobin: 9.2 g/dL — ABNORMAL LOW (ref 13.0–17.0)
Lymphocytes Relative: 5 %
Lymphs Abs: 1.5 10*3/uL (ref 0.7–4.0)
MCH: 29 pg (ref 26.0–34.0)
MCHC: 34.2 g/dL (ref 30.0–36.0)
MCV: 84.9 fL (ref 80.0–100.0)
Monocytes Absolute: 0.9 10*3/uL (ref 0.1–1.0)
Monocytes Relative: 3 %
Neutro Abs: 25 10*3/uL — ABNORMAL HIGH (ref 1.7–7.7)
Neutrophils Relative %: 86 %
Platelets: 342 10*3/uL (ref 150–400)
Promyelocytes Relative: 2 %
RBC: 3.17 MIL/uL — ABNORMAL LOW (ref 4.22–5.81)
RDW: 16.4 % — ABNORMAL HIGH (ref 11.5–15.5)
WBC: 29.1 10*3/uL — ABNORMAL HIGH (ref 4.0–10.5)
nRBC: 0 /100 WBC
nRBC: 0.1 % (ref 0.0–0.2)

## 2019-12-31 LAB — RENAL FUNCTION PANEL
Albumin: 1.6 g/dL — ABNORMAL LOW (ref 3.5–5.0)
Anion gap: 17 — ABNORMAL HIGH (ref 5–15)
BUN: 102 mg/dL — ABNORMAL HIGH (ref 6–20)
CO2: 22 mmol/L (ref 22–32)
Calcium: 8.8 mg/dL — ABNORMAL LOW (ref 8.9–10.3)
Chloride: 87 mmol/L — ABNORMAL LOW (ref 98–111)
Creatinine, Ser: 8.57 mg/dL — ABNORMAL HIGH (ref 0.61–1.24)
GFR calc Af Amer: 7 mL/min — ABNORMAL LOW (ref >60)
GFR calc non Af Amer: 6 mL/min — ABNORMAL LOW (ref >60)
Glucose, Bld: 144 mg/dL — ABNORMAL HIGH (ref 70–99)
Phosphorus: 6.4 mg/dL — ABNORMAL HIGH (ref 2.5–4.6)
Potassium: 5 mmol/L (ref 3.5–5.1)
Sodium: 126 mmol/L — ABNORMAL LOW (ref 135–145)

## 2019-12-31 LAB — GLUCOSE, CAPILLARY
Glucose-Capillary: 153 mg/dL — ABNORMAL HIGH (ref 70–99)
Glucose-Capillary: 139 mg/dL — ABNORMAL HIGH (ref 70–99)
Glucose-Capillary: 149 mg/dL — ABNORMAL HIGH (ref 70–99)

## 2019-12-31 MED ORDER — VANCOMYCIN VARIABLE DOSE PER UNSTABLE RENAL FUNCTION (PHARMACIST DOSING): Status: DC

## 2019-12-31 MED ORDER — VANCOMYCIN HCL IN DEXTROSE 1-5 GM/200ML-% IV SOLN
INTRAVENOUS | Status: AC
  Administered 2019-12-31: 1000 mg via INTRAVENOUS
  Filled 2019-12-31: qty 200

## 2019-12-31 MED ORDER — HEPARIN SODIUM (PORCINE) 1000 UNIT/ML IJ SOLN
INTRAMUSCULAR | Status: AC
  Administered 2019-12-31: 5000 [IU] via INTRAVENOUS_CENTRAL
  Filled 2019-12-31: qty 5

## 2019-12-31 MED ORDER — VANCOMYCIN HCL IN DEXTROSE 1-5 GM/200ML-% IV SOLN
1000.0000 mg | INTRAVENOUS | Status: AC
  Filled 2019-12-31: qty 200

## 2019-12-31 NOTE — Progress Notes (Signed)
PROGRESS NOTE    Seth [name]  QMG:[id number] DOB: 1966-05-18 DOA: 12/12/2019 PCP: Mitzi Hansen, MD    Brief Narrative: HPI per Dr. Halford Chessman 54 year old male with PMH as below, which is significant for ESRD on HD, chronic hypoxemic respiratory failure no home O2, OSA on CPAP, DM, discitis, and cord compression myelopathy. Most recent HD was 3/17. He left the session 45 mins early due to back pain. He was in his usual state of health until approximately 3/11 when he developed muscle aches and back pain. He had just received his 1st COVID-19 vaccine a few days prior and it was felt these symptoms were secondary to this. He has been seen twice in the ED in the past week. First for complaints of febrile illness. He was not given any antibiotics at that time. He then returned 3/18 with complaints of myalgias. He returned to ED later in the day 3/18 via EMS after being found to have altered mental status at home. Upon arrival to ED he was noted to be hypotensive with SBP in the 60s and hypoxemic with O2 sats in the 70s on room air. Improved with supplemental O2 and levophed infusion after 279m bolus. There were also concerns he had been unable to use his legs the past few days and MRI spine was ordered. PCCM asked to evaluate for admission due to need for vasoactive infusion.   Assessment & Plan:   Principal Problem:   MRSA bacteremia Active Problems:   Hyperlipidemia associated with type 2 diabetes mellitus ([facility]   ESRD (end stage renal disease) on dialysis ([name])   Absolute anemia   DM (diabetes mellitus) type 2, uncontrolled, with ketoacidosis ([name])   Hyperkalemia   Hyponatremia   Septic shock ([name])   Sepsis due to infected intravenous catheter ([name])   Wrist pain, acute, left  1 septic shock secondary to MRSA bacteremia secondary to infected HD catheter/ Patient had presented noted to be in septic shock requiring pressors felt secondary to infected HD catheter.  Infected HD catheter  removed 12/27/2019 with purulence noted.  Patient currently off pressors.  Systolic blood pressure low 90s-100s.  MRSA PCR positive.  Blood cultures positive for MRSA.  2D echo negative for any vegetations/endocarditis.   Repeat blood cultures from 12/28/2019 + for staph aureus.  Leukocytosis trending up with white count now at 29.1.  Repeat blood cultures done on 12/30/2019 with gram-positive cocci.  TEE was ordered to rule out endocarditis however per nephrology request will call cardiology to cancel TEE for today in order to prioritize patient's hemodialysis as patient missed hemodialysis yesterday and starting to look uremic per nephrology.  Midodrine dose increased to 15 mg 3 times daily per nephrology.  Continue current regimen of IV vancomycin.  Cardiology called to cancel TEE hopefully TEE can be rescheduled.  Per ID.    2.  Hyperkalemia/hyponatremia/acidosis/hyperphosphatemia Secondary to end-stage renal disease.  Status post Lokelma.  Potassium at 5 this morning.  Sodium levels fluctuating currently at 126.  Phosphorus at 6.4.  Patient with end-stage renal disease unable to undergo hemodialysis yesterday due to failed cannulation.  Patient also due for hemodialysis today.  Per nephrology.   3.  Lower extremity weakness Patient still with lower extremity weakness with slight improvement.  Initially unable to obtain MRI due to claustrophobia however was given some Ativan and MRI obtained earlier this morning 12/29/2019.  MRI of the L-spine obtained with no acute abnormality or evidence for infection within the lumbar spine, technically limited  exam due to motion artifact.  10 mm anterior listhesis of L5 on S1 with associated moderate bilateral subarticular stenosis, with moderate to severe bilateral L5 foraminal narrowing.  Right foraminal to extraforaminal disc protrusion at L3-4 potentially affecting the exiting right L3 nerve root.  Diffusely decreased bone marrow signal intensity most likely related to  dialysis related spondyloarthropathy.  PT/OT.   4.  Left wrist pain Plain films of the left wrist negative for any acute abnormalities.  Supportive care.  Follow.  5.  Diabetes mellitus II Hemoglobin A1c of 7.1.  CBG of 153 this morning.  Continue sliding scale insulin.  6.  End-stage renal disease/uremia Patient being followed by nephrology.  Patient unable to undergo hemodialysis yesterday due to failed cannulation.  Nephrology to retry cannulating with 16-gauge today and if successful will need temporary cath placed for HD today as per nephrology patient starting to look uremic today.  Nephrology requesting TEE be canceled today to prioritize patient hemodialysis.   7.  OSA CPAP  8.  Hypertension Patient presented in septic shock.  Systolic blood pressure is soft in the 90s-100s.  Continue increased dose of midodrine 15 mg 3 times daily per nephrology.  Continue to hold Norvasc.    DVT prophylaxis: Heparin Code Status: Full Family Communication: Updated patient.  Updated sister Alvina Filbert via telephone with patient's permission. Disposition Plan:  . Patient came from: Home            . Anticipated d/c place: Home . Barriers to d/c OR conditions which need to be met to effect a safe d/c: Likely home with home health versus SNF pending PT evaluation, once clinically improved, improvement with hypotension/sepsis, electrolyte abnormalities corrected, improving leukocytosis, pending TEE, cleared by ID and nephrology.   Consultants:   Patient admitted to critical care service  ID: Dr.Comer 12/27/2019  Nephrology: Dr. Hollie Salk 12/27/2019  Procedures:   CT left wrist 12/27/2019  CT angiogram chest 01/06/2020  Plain films of the left wrist 12/27/2019  Chest x-ray 12/16/2019  Incomplete MRI T and L-spine 12/27/2019--- patient could not tolerate due to claustrophobia.  2D echo 12/27/2019  Successful removal of tunneled left IJ hemodialysis catheter with purulence expressed from  tract per Ascencion Dike, PA interventional radiology 12/27/2019  Antimicrobials:  IV Zosyn 12/27/2019>>>> 12/27/2019  IV vancomycin 01/04/2020   Subjective: Patient laying in bed as well as working with physical therapy.  Somewhat drowsy but following commands and answering questions.  Complaining of diffuse pain all over in his joints.  Denies any chest pain.  Denies any shortness of breath.   Objective: Vitals:   12/30/19 2346 12/31/19 0200 12/31/19 0350 12/31/19 0759  BP: 105/64 (!) 103/56 (!) 121/57 (!) 106/54  Pulse: 85 86 88 86  Resp: (!) 26 (!) 31 (!) 28 18  Temp: (!) 100.4 F (38 C) 98.6 F (37 C)  99.5 F (37.5 C)  TempSrc: Axillary   Oral  SpO2: 94% 96% 98% 97%  Weight:   128.7 kg   Height:        Intake/Output Summary (Last 24 hours) at 12/31/2019 0930 Last data filed at 12/30/2019 2135 Gross per 24 hour  Intake 120 ml  Output --  Net 120 ml   Filed Weights   12/28/19 1452 12/30/19 0403 12/31/19 0350  Weight: 128.8 kg 135.1 kg 128.7 kg    Examination:  General exam: NAD.  Respiratory system: CTA B anterior lung fields with no wheezes, no crackles, no rhonchi.  Normal respiratory effort.  Cardiovascular system: Regular rate rhythm no murmurs rubs or gallops.  No JVD.  No lower extremity edema.  Gastrointestinal system: Abdomen is soft, nontender, nondistended, positive bowel sounds.  No rebound.  No guarding.   Central nervous system: Somewhat drowsy however following commands moving extremities.   Extremities: 3-4/5 bilateral lower extremity strength.  Skin: No rashes, lesions or ulcers Psychiatry: Judgement and insight appear fair. Mood & affect appropriate.     Data Reviewed: I have personally reviewed following labs and imaging studies  CBC: Recent Labs  Lab 12/18/2019 2220 01/05/2020 2245 12/27/19 2253 12/28/19 0818 12/29/19 0751 12/30/19 0644 12/31/19 0428  WBC 19.7*   < > 13.5* 18.0* 19.9* 24.9* 29.1*  NEUTROABS 17.5*  --   --  15.1* 16.5*  21.4* 25.0*  HGB 11.3*   < > 11.4* 10.7* 10.5* 9.5* 9.2*  HCT 35.4*   < > 34.8* 32.2* 31.1* 27.7* 26.9*  MCV 92.2   < > 89.9 87.3 85.9 84.7 84.9  PLT 250   < > 247 273 289 329 342   < > = values in this interval not displayed.   Basic Metabolic Panel: Recent Labs  Lab 12/27/19 0434 12/27/19 0434 12/27/19 2253 12/28/19 0818 12/29/19 0751 12/30/19 0644 12/31/19 0428  NA 128*   < > 123* 125* 129* 127* 126*  K 5.3*   < > 5.5* 6.3* 4.7 4.9 5.0  CL 89*   < > 87* 87* 90* 88* 87*  CO2 17*   < > 17* 19* 20* 18* 22  GLUCOSE 115*   < > 120* 114* 135* 147* 144*  BUN 62*   < > 74* 81* 53* 79* 102*  CREATININE 9.93*   < > 10.50* 10.97* 6.56* 7.47* 8.57*  CALCIUM 8.2*   < > 7.6* 7.6* 8.7* 8.6* 8.8*  MG 1.6*  --   --  2.0 1.9  --   --   PHOS 6.0*   < > 6.1* 7.0* 6.1* 6.3* 6.4*   < > = values in this interval not displayed.   GFR: Estimated Creatinine Clearance: 12.7 mL/min (A) (by C-G formula based on SCr of 8.57 mg/dL (H)). Liver Function Tests: Recent Labs  Lab 01/02/2020 2220 12/10/2019 2220 12/27/19 2253 12/28/19 0818 12/29/19 0751 12/30/19 0644 12/31/19 0428  AST 52*  --   --   --   --   --   --   ALT 31  --   --   --   --   --   --   ALKPHOS 213*  --   --   --   --   --   --   BILITOT 1.1  --   --   --   --   --   --   PROT 7.4  --   --   --   --   --   --   ALBUMIN 2.2*   < > 1.8* 1.8* 1.7* 1.6* 1.6*   < > = values in this interval not displayed.   No results for input(s): LIPASE, AMYLASE in the last 168 hours. No results for input(s): AMMONIA in the last 168 hours. Coagulation Profile: Recent Labs  Lab 12/27/19 0010  INR 1.4*   Cardiac Enzymes: No results for input(s): CKTOTAL, CKMB, CKMBINDEX, TROPONINI in the last 168 hours. BNP (last 3 results) No results for input(s): PROBNP in the last 8760 hours. HbA1C: No results for input(s): HGBA1C in the last 72 hours. CBG: Recent Labs  Lab  12/30/19 0903 12/30/19 1238 12/30/19 1746 12/30/19 2121 12/31/19 0758    GLUCAP 157* 152* 193* 153* 153*   Lipid Profile: No results for input(s): CHOL, HDL, LDLCALC, TRIG, CHOLHDL, LDLDIRECT in the last 72 hours. Thyroid Function Tests: No results for input(s): TSH, T4TOTAL, FREET4, T3FREE, THYROIDAB in the last 72 hours. Anemia Panel: No results for input(s): VITAMINB12, FOLATE, FERRITIN, TIBC, IRON, RETICCTPCT in the last 72 hours. Sepsis Labs: Recent Labs  Lab 12/14/2019 2244 12/27/19 0020 12/27/19 0434  PROCALCITON  --   --  >150.00  LATICACIDVEN 4.1* 2.4*  --     Recent Results (from the past 240 hour(s))  SARS CORONAVIRUS 2 (TAT 6-24 HRS) Nasopharyngeal Nasopharyngeal Swab     Status: None   Collection Time: 12/22/19 11:45 PM   Specimen: Nasopharyngeal Swab  Result Value Ref Range Status   SARS Coronavirus 2 NEGATIVE NEGATIVE Final    Comment: (NOTE) SARS-CoV-2 target nucleic acids are NOT DETECTED. The SARS-CoV-2 RNA is generally detectable in upper and lower respiratory specimens during the acute phase of infection. Negative results do not preclude SARS-CoV-2 infection, do not rule out co-infections with other pathogens, and should not be used as the sole basis for treatment or other patient management decisions. Negative results must be combined with clinical observations, patient history, and epidemiological information. The expected result is Negative. Fact Sheet for Patients: SugarRoll.be Fact Sheet for Healthcare Providers: https://www.woods-mathews.com/ This test is not yet approved or cleared by the [place] FDA and  has been authorized for detection and/or diagnosis of SARS-CoV-2 by FDA under an Emergency Use Authorization (EUA). This EUA will remain  in effect (meaning this test can be used) for the duration of the COVID-19 declaration under Section 56 4(b)(1) of the Act, 21 U.S.C. section 360bbb-3(b)(1), unless the authorization is terminated or revoked sooner. Performed at [facility], [place] [address] [place], [place] [address]   Blood Culture (routine x 2)     Status: Abnormal   Collection Time: 12/24/2019 10:15 PM   Specimen: BLOOD RIGHT ARM  Result Value Ref Range Status   Specimen Description BLOOD RIGHT ARM  Final   Special Requests   Final    BOTTLES DRAWN AEROBIC AND ANAEROBIC Blood Culture results may not be optimal due to an inadequate volume of blood received in culture bottles   Culture  Setup Time   Final    GRAM POSITIVE COCCI IN CLUSTERS IN BOTH AEROBIC AND ANAEROBIC BOTTLES CRITICAL RESULT CALLED TO, READ BACK BY AND VERIFIED WITH: J. Roselie Skinner, AT 1009 12/27/19 DV Performed at [facility], [place] [address], [place], [place] [address]    Culture METHICILLIN RESISTANT STAPHYLOCOCCUS AUREUS (A)  Final   Report Status 12/29/2019 FINAL  Final   Organism ID, Bacteria METHICILLIN RESISTANT STAPHYLOCOCCUS AUREUS  Final      Susceptibility   Methicillin resistant staphylococcus aureus - MIC*    CIPROFLOXACIN <=0.5 SENSITIVE Sensitive     ERYTHROMYCIN >=8 RESISTANT Resistant     GENTAMICIN <=0.5 SENSITIVE Sensitive     OXACILLIN >=4 RESISTANT Resistant     TETRACYCLINE <=1 SENSITIVE Sensitive     VANCOMYCIN 1 SENSITIVE Sensitive     TRIMETH/SULFA <=10 SENSITIVE Sensitive     CLINDAMYCIN <=0.25 SENSITIVE Sensitive     RIFAMPIN <=0.5 SENSITIVE Sensitive     Inducible Clindamycin NEGATIVE Sensitive     * METHICILLIN RESISTANT STAPHYLOCOCCUS AUREUS  Blood Culture (routine x 2)     Status: Abnormal  Collection Time: 12/19/2019 10:41 PM   Specimen: BLOOD RIGHT HAND  Result Value Ref Range Status   Specimen Description BLOOD RIGHT HAND  Final   Special Requests AEROBIC BOTTLE ONLY Blood Culture adequate volume  Final   Culture  Setup Time   Final    GRAM POSITIVE COCCI IN CLUSTERS AEROBIC BOTTLE ONLY CRITICAL VALUE NOTED.  VALUE IS CONSISTENT WITH PREVIOUSLY REPORTED AND CALLED VALUE.    Culture (A)  Final    STAPHYLOCOCCUS  AUREUS SUSCEPTIBILITIES PERFORMED ON PREVIOUS CULTURE WITHIN THE LAST 5 DAYS. Performed at [facility], [place] [address], [place], [place] [address]    Report Status 12/29/2019 FINAL  Final  Blood Culture ID Panel (Reflexed)     Status: Abnormal   Collection Time: 12/14/2019 10:41 PM  Result Value Ref Range Status   Enterococcus species NOT DETECTED NOT DETECTED Final   Listeria monocytogenes NOT DETECTED NOT DETECTED Final   Staphylococcus species DETECTED (A) NOT DETECTED Final    Comment: CRITICAL RESULT CALLED TO, READ BACK BY AND VERIFIED WITH: Andres Shad PharmD 14:30 12/27/19 ([email or web address])    Staphylococcus aureus (BCID) DETECTED (A) NOT DETECTED Final    Comment: Methicillin (oxacillin)-resistant Staphylococcus aureus (MRSA). MRSA is predictably resistant to beta-lactam antibiotics (except ceftaroline). Preferred therapy is vancomycin unless clinically contraindicated. Patient requires contact precautions if  hospitalized. CRITICAL RESULT CALLED TO, READ BACK BY AND VERIFIED WITH: Andres Shad PharmD 14:30 12/27/19 ([email or web address])    Methicillin resistance DETECTED (A) NOT DETECTED Final    Comment: CRITICAL RESULT CALLED TO, READ BACK BY AND VERIFIED WITH: Andres Shad PharmD 14:30 12/27/19 ([email or web address])    Streptococcus species NOT DETECTED NOT DETECTED Final   Streptococcus agalactiae NOT DETECTED NOT DETECTED Final   Streptococcus pneumoniae NOT DETECTED NOT DETECTED Final   Streptococcus pyogenes NOT DETECTED NOT DETECTED Final   Acinetobacter baumannii NOT DETECTED NOT DETECTED Final   Enterobacteriaceae species NOT DETECTED NOT DETECTED Final   Enterobacter cloacae complex NOT DETECTED NOT DETECTED Final   Escherichia coli NOT DETECTED NOT DETECTED Final   Klebsiella oxytoca NOT DETECTED NOT DETECTED Final   Klebsiella pneumoniae NOT DETECTED NOT DETECTED Final   Proteus species NOT DETECTED NOT DETECTED Final   Serratia marcescens NOT DETECTED NOT DETECTED Final   Haemophilus influenzae  NOT DETECTED NOT DETECTED Final   Neisseria meningitidis NOT DETECTED NOT DETECTED Final   Pseudomonas aeruginosa NOT DETECTED NOT DETECTED Final   Candida albicans NOT DETECTED NOT DETECTED Final   Candida glabrata NOT DETECTED NOT DETECTED Final   Candida krusei NOT DETECTED NOT DETECTED Final   Candida parapsilosis NOT DETECTED NOT DETECTED Final   Candida tropicalis NOT DETECTED NOT DETECTED Final    Comment: Performed at [facility], [place] [address] [place], [place] [address]  Respiratory Panel by RT PCR (Flu A&B, Covid) - Nasopharyngeal Swab     Status: None   Collection Time: 12/15/2019 11:22 PM   Specimen: Nasopharyngeal Swab  Result Value Ref Range Status   SARS Coronavirus 2 by RT PCR NEGATIVE NEGATIVE Final    Comment: (NOTE) SARS-CoV-2 target nucleic acids are NOT DETECTED. The SARS-CoV-2 RNA is generally detectable in upper respiratoy specimens during the acute phase of infection. The lowest concentration of SARS-CoV-2 viral copies this assay can detect is 131 copies/mL. A negative result does not preclude SARS-Cov-2 infection and should not be used as the sole basis for treatment or other patient management decisions. A negative result may occur with  improper specimen  collection/handling, submission of specimen other than nasopharyngeal swab, presence of viral mutation(s) within the areas targeted by this assay, and inadequate number of viral copies (<131 copies/mL). A negative result must be combined with clinical observations, patient history, and epidemiological information. The expected result is Negative. Fact Sheet for Patients:  PinkCheek.be Fact Sheet for Healthcare Providers:  GravelBags.it This test is not yet ap proved or cleared by the [place] FDA and  has been authorized for detection and/or diagnosis of SARS-CoV-2 by FDA under an Emergency Use Authorization (EUA). This EUA will remain   in effect (meaning this test can be used) for the duration of the COVID-19 declaration under Section 564(b)(1) of the Act, 21 U.S.C. section 360bbb-3(b)(1), unless the authorization is terminated or revoked sooner.    Influenza A by PCR NEGATIVE NEGATIVE Final   Influenza B by PCR NEGATIVE NEGATIVE Final    Comment: (NOTE) The Xpert Xpress SARS-CoV-2/FLU/RSV assay is intended as an aid in  the diagnosis of influenza from Nasopharyngeal swab specimens and  should not be used as a sole basis for treatment. Nasal washings and  aspirates are unacceptable for Xpert Xpress SARS-CoV-2/FLU/RSV  testing. Fact Sheet for Patients: PinkCheek.be Fact Sheet for Healthcare Providers: GravelBags.it This test is not yet approved or cleared by the [place] FDA and  has been authorized for detection and/or diagnosis of SARS-CoV-2 by  FDA under an Emergency Use Authorization (EUA). This EUA will remain  in effect (meaning this test can be used) for the duration of the  Covid-19 declaration under Section 564(b)(1) of the Act, 21  U.S.C. section 360bbb-3(b)(1), unless the authorization is  terminated or revoked. Performed at [facility], [place] [address] [place], [place] [address]   MRSA PCR Screening     Status: Abnormal   Collection Time: 12/27/19  5:30 AM   Specimen: Nasopharyngeal  Result Value Ref Range Status   MRSA by PCR POSITIVE (A) NEGATIVE Final    Comment:        The GeneXpert MRSA Assay (FDA approved for NASAL specimens only), is one component of a comprehensive MRSA colonization surveillance program. It is not intended to diagnose MRSA infection nor to guide or monitor treatment for MRSA infections. RESULT CALLED TO, READ BACK BY AND VERIFIED WITH: RN Sarajane Jews [phone] FCP Performed at [facility] [address] [place], [place] [address]   Culture, blood (routine x 2)     Status: None  (Preliminary result)   Collection Time: 12/28/19  5:40 AM   Specimen: BLOOD RIGHT HAND  Result Value Ref Range Status   Specimen Description BLOOD RIGHT HAND  Final   Special Requests   Final    BOTTLES DRAWN AEROBIC AND ANAEROBIC Blood Culture adequate volume   Culture  Setup Time   Final    ANAEROBIC BOTTLE ONLY GRAM POSITIVE COCCI IN CLUSTERS CRITICAL VALUE NOTED.  VALUE IS CONSISTENT WITH PREVIOUSLY REPORTED AND CALLED VALUE. Performed at [facility], [place] [address], [place], [place] [address]    Culture NO GROWTH 3 DAYS  Final   Report Status PENDING  Incomplete  Culture, blood (routine x 2)     Status: Abnormal   Collection Time: 12/28/19  5:59 AM   Specimen: BLOOD RIGHT HAND  Result Value Ref Range Status   Specimen Description BLOOD RIGHT HAND  Final   Special Requests IN PEDIATRIC BOTTLE Blood Culture adequate volume  Final   Culture  Setup Time  Final    GRAM POSITIVE COCCI IN PEDIATRIC BOTTLE CRITICAL RESULT CALLED TO, READ BACK BY AND VERIFIED WITH: PHARMD LINDSAY CHEN _0  12/28/2019 AKT    Culture (A)  Final    STAPHYLOCOCCUS AUREUS SUSCEPTIBILITIES PERFORMED ON PREVIOUS CULTURE WITHIN THE LAST 5 DAYS. Performed at [facility], [place] [address], [place], [place] [address]    Report Status 12/29/2019 FINAL  Final  Culture, blood (routine x 2)     Status: None (Preliminary result)   Collection Time: 12/30/19  6:35 AM   Specimen: BLOOD  Result Value Ref Range Status   Specimen Description BLOOD RIGHT ANTECUBITAL  Final   Special Requests   Final    BOTTLES DRAWN AEROBIC AND ANAEROBIC Blood Culture adequate volume   Culture NO GROWTH < 24 HOURS  Final   Report Status PENDING  Incomplete  Culture, blood (routine x 2)     Status: None (Preliminary result)   Collection Time: 12/30/19  6:40 AM   Specimen: BLOOD RIGHT HAND  Result Value Ref Range Status   Specimen Description BLOOD RIGHT HAND  Final   Special Requests   Final    BOTTLES DRAWN AEROBIC  AND ANAEROBIC Blood Culture adequate volume   Culture  Setup Time   Final    GRAM POSITIVE COCCI IN CLUSTERS ANAEROBIC BOTTLE ONLY CRITICAL VALUE NOTED.  VALUE IS CONSISTENT WITH PREVIOUSLY REPORTED AND CALLED VALUE. Performed at [facility], [place] [address], [place], [place] [address]    Culture GRAM POSITIVE COCCI  Final   Report Status PENDING  Incomplete         Radiology Studies: No results found.      Scheduled Meds: . Chlorhexidine Gluconate Cloth  6 each Topical Q0600  . ferric citrate  420 mg Oral TID WC  . heparin  5,000 Units Subcutaneous Q8H  . insulin aspart  0-20 Units Subcutaneous TID WC  . lidocaine  15 mL Oral Once  . midodrine  15 mg Oral TID WC  . pantoprazole  40 mg Oral Q0600  . vancomycin variable dose per unstable renal function (pharmacist dosing)   Does not apply See admin instructions   Continuous Infusions: . sodium chloride 250 mL (12/27/19 0436)  . sodium chloride    . sodium chloride    . sodium chloride       LOS: 4 days    Time spent: 40 minutes    Irine Seal, MD [facility]   To contact the attending provider between 7A-7P or the covering provider during after hours 7P-7A, please log into the web site [email or web address] and access using universal [place] password for that web site. If you do not have the password, please call the hospital operator.  12/31/2019, 9:30 AM

## 2019-12-31 NOTE — Progress Notes (Signed)
[facility] for Infectious Disease  Date of Admission:  12/19/2019      Total days of antibiotics 4   Vancomycin (missed a dose recently)           ASSESSMENT: Seth [name] is a 54 y.o. 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 He has had persistently positive blood cultures including repeated set drawn on 3/21. MRIs of T- and L-spine without vertebral infection. CT of the left wrist without any evidence of infection.  CT scan of chest revealed peripheral nodularities bilaterally with the largest demonstrating central cavitation - ? Septic pulmonary emboli. TEE to follow.   Will repeat his Blood cultures now with plans ongoing about possibly placing temporary HD line for urgent HD - this is to be done prior to TEE. Has ongoing leukocytosis as well.   Of note this is his 3rd episode of MRSA bacteremia. Hopefully can access AVF and avoid long-term HD line. Last treated with 4 weeks IV vancomycin through September 18th 2020.     PLAN: 1. Continue vancomycin once able to sort out HD access.  2. TEE pending after urgent HD completed 3. Blood cultures now   Principal Problem:   MRSA bacteremia Active Problems:   Hyperlipidemia associated with type 2 diabetes mellitus ([place])   ESRD (end stage renal disease) on dialysis ([place])   Absolute anemia   DM (diabetes mellitus) type 2, uncontrolled, with ketoacidosis ([place])   Hyperkalemia   Hyponatremia   Septic shock ([place])   Sepsis due to infected intravenous catheter ([name])   Wrist pain, acute, left   . Chlorhexidine Gluconate Cloth  6 each Topical Q0600  . ferric citrate  420 mg Oral TID WC  . heparin  5,000 Units Subcutaneous Q8H  . insulin aspart  0-20 Units Subcutaneous TID WC  . lidocaine  15 mL Oral Once  . midodrine  15 mg Oral TID WC  . pantoprazole  40 mg Oral Q0600    SUBJECTIVE: No complaints today aside from dry mouth. Feels a little "fuzzy  headed."   Interval Hx - afebrile. Repeatedly positive blood cultures with another set on 3/21 now growing gram positive cocci. Had trouble getting IV vancomycin recently with HD.    Review of Systems: Review of Systems  Constitutional: Negative for chills, fever and malaise/fatigue.  Eyes: Negative for blurred vision.  Respiratory: Negative for cough and shortness of breath.   Cardiovascular: Negative for chest pain.  Gastrointestinal: Negative for abdominal pain, nausea and vomiting.  Musculoskeletal: Positive for back pain. Negative for joint pain and neck pain.    No Known Allergies  OBJECTIVE: Vitals:   12/31/19 1140 12/31/19 1150 12/31/19 1152 12/31/19 1200  BP: (!) 144/93 (!) 112/53 (!) 104/56 (!) 110/56  Pulse: 79 80 81 80  Resp: (!) 24 (!) 24 (!) 24 (!) 27  Temp: 98.5 F (36.9 C)     TempSrc: Oral     SpO2: 100%     Weight: 127.7 kg     Height:       Body mass index is 42.81 kg/m.  Physical Exam Vitals and nursing note reviewed.  Constitutional:      Appearance: He is obese.     Comments: Resting in bed. Twitching at times throughout the conversation.   HENT:     Mouth/Throat:     Mouth: Mucous membranes are dry.  Eyes:     Pupils:  Pupils are equal, round, and reactive to light.  Cardiovascular:     Rate and Rhythm: Normal rate and regular rhythm.     Heart sounds: No murmur.  Pulmonary:     Effort: Pulmonary effort is normal.     Breath sounds: Normal breath sounds.  Musculoskeletal:        General: Normal range of motion.  Skin:    General: Skin is warm and dry.     Capillary Refill: Capillary refill takes less than 2 seconds.  Neurological:     Mental Status: He is alert and oriented to person, place, and time.  Psychiatric:        Mood and Affect: Mood normal.     Lab Results Lab Results  Component Value Date   WBC 29.1 (H) 12/31/2019   HGB 9.2 (L) 12/31/2019   HCT 26.9 (L) 12/31/2019   MCV 84.9 12/31/2019   PLT 342 12/31/2019    Lab  Results  Component Value Date   CREATININE 8.57 (H) 12/31/2019   BUN 102 (H) 12/31/2019   NA 126 (L) 12/31/2019   K 5.0 12/31/2019   CL 87 (L) 12/31/2019   CO2 22 12/31/2019    Lab Results  Component Value Date   ALT 31 12/29/2019   AST 52 (H) 01/02/2020   ALKPHOS 213 (H) 12/25/2019   BILITOT 1.1 12/23/2019     Microbiology: Recent Results (from the past 240 hour(s))  SARS CORONAVIRUS 2 (TAT 6-24 HRS) Nasopharyngeal Nasopharyngeal Swab     Status: None   Collection Time: 12/22/19 11:45 PM   Specimen: Nasopharyngeal Swab  Result Value Ref Range Status   SARS Coronavirus 2 NEGATIVE NEGATIVE Final    Comment: (NOTE) SARS-CoV-2 target nucleic acids are NOT DETECTED. The SARS-CoV-2 RNA is generally detectable in upper and lower respiratory specimens during the acute phase of infection. Negative results do not preclude SARS-CoV-2 infection, do not rule out co-infections with other pathogens, and should not be used as the sole basis for treatment or other patient management decisions. Negative results must be combined with clinical observations, patient history, and epidemiological information. The expected result is Negative. Fact Sheet for Patients: SugarRoll.be Fact Sheet for Healthcare Providers: https://www.woods-mathews.com/ This test is not yet approved or cleared by the [place] FDA and  has been authorized for detection and/or diagnosis of SARS-CoV-2 by FDA under an Emergency Use Authorization (EUA). This EUA will remain  in effect (meaning this test can be used) for the duration of the COVID-19 declaration under Section 56 4(b)(1) of the Act, 21 U.S.C. section 360bbb-3(b)(1), unless the authorization is terminated or revoked sooner. Performed at [facility], [place] [address], [place], [place] [address]   Blood Culture (routine x 2)     Status: Abnormal   Collection Time: 12/30/2019 10:15 PM   Specimen: BLOOD RIGHT  ARM  Result Value Ref Range Status   Specimen Description BLOOD RIGHT ARM  Final   Special Requests   Final    BOTTLES DRAWN AEROBIC AND ANAEROBIC Blood Culture results may not be optimal due to an inadequate volume of blood received in culture bottles   Culture  Setup Time   Final    GRAM POSITIVE COCCI IN CLUSTERS IN BOTH AEROBIC AND ANAEROBIC BOTTLES CRITICAL RESULT CALLED TO, READ BACK BY AND VERIFIED WITH: J. Roselie Skinner, AT 1009 12/27/19 DV Performed at [facility], [employer] [address] [place], [place] [address]    Culture METHICILLIN RESISTANT STAPHYLOCOCCUS AUREUS (A)  Final   Report Status 12/29/2019 FINAL  Final   Organism ID, Bacteria METHICILLIN RESISTANT STAPHYLOCOCCUS AUREUS  Final      Susceptibility   Methicillin resistant staphylococcus aureus - MIC*    CIPROFLOXACIN <=0.5 SENSITIVE Sensitive     ERYTHROMYCIN >=8 RESISTANT Resistant     GENTAMICIN <=0.5 SENSITIVE Sensitive     OXACILLIN >=4 RESISTANT Resistant     TETRACYCLINE <=1 SENSITIVE Sensitive     VANCOMYCIN 1 SENSITIVE Sensitive     TRIMETH/SULFA <=10 SENSITIVE Sensitive     CLINDAMYCIN <=0.25 SENSITIVE Sensitive     RIFAMPIN <=0.5 SENSITIVE Sensitive     Inducible Clindamycin NEGATIVE Sensitive     * METHICILLIN RESISTANT STAPHYLOCOCCUS AUREUS  Blood Culture (routine x 2)     Status: Abnormal   Collection Time: 01/02/2020 10:41 PM   Specimen: BLOOD RIGHT HAND  Result Value Ref Range Status   Specimen Description BLOOD RIGHT HAND  Final   Special Requests AEROBIC BOTTLE ONLY Blood Culture adequate volume  Final   Culture  Setup Time   Final    GRAM POSITIVE COCCI IN CLUSTERS AEROBIC BOTTLE ONLY CRITICAL VALUE NOTED.  VALUE IS CONSISTENT WITH PREVIOUSLY REPORTED AND CALLED VALUE.    Culture (A)  Final    STAPHYLOCOCCUS AUREUS SUSCEPTIBILITIES PERFORMED ON PREVIOUS CULTURE WITHIN THE LAST 5 DAYS. Performed at [facility], [place] [address] [place], [place] [address]    Report Status 12/29/2019  FINAL  Final  Blood Culture ID Panel (Reflexed)     Status: Abnormal   Collection Time: 12/14/2019 10:41 PM  Result Value Ref Range Status   Enterococcus species NOT DETECTED NOT DETECTED Final   Listeria monocytogenes NOT DETECTED NOT DETECTED Final   Staphylococcus species DETECTED (A) NOT DETECTED Final    Comment: CRITICAL RESULT CALLED TO, READ BACK BY AND VERIFIED WITH: Andres Shad PharmD 14:30 12/27/19 ([email or web address])    Staphylococcus aureus (BCID) DETECTED (A) NOT DETECTED Final    Comment: Methicillin (oxacillin)-resistant Staphylococcus aureus (MRSA). MRSA is predictably resistant to beta-lactam antibiotics (except ceftaroline). Preferred therapy is vancomycin unless clinically contraindicated. Patient requires contact precautions if  hospitalized. CRITICAL RESULT CALLED TO, READ BACK BY AND VERIFIED WITH: Andres Shad PharmD 14:30 12/27/19 ([email or web address])    Methicillin resistance DETECTED (A) NOT DETECTED Final    Comment: CRITICAL RESULT CALLED TO, READ BACK BY AND VERIFIED WITH: Andres Shad PharmD 14:30 12/27/19 ([email or web address])    Streptococcus species NOT DETECTED NOT DETECTED Final   Streptococcus agalactiae NOT DETECTED NOT DETECTED Final   Streptococcus pneumoniae NOT DETECTED NOT DETECTED Final   Streptococcus pyogenes NOT DETECTED NOT DETECTED Final   Acinetobacter baumannii NOT DETECTED NOT DETECTED Final   Enterobacteriaceae species NOT DETECTED NOT DETECTED Final   Enterobacter cloacae complex NOT DETECTED NOT DETECTED Final   Escherichia coli NOT DETECTED NOT DETECTED Final   Klebsiella oxytoca NOT DETECTED NOT DETECTED Final   Klebsiella pneumoniae NOT DETECTED NOT DETECTED Final   Proteus species NOT DETECTED NOT DETECTED Final   Serratia marcescens NOT DETECTED NOT DETECTED Final   Haemophilus influenzae NOT DETECTED NOT DETECTED Final   Neisseria meningitidis NOT DETECTED NOT DETECTED Final   Pseudomonas aeruginosa NOT DETECTED NOT DETECTED Final   Candida albicans NOT DETECTED NOT  DETECTED Final   Candida glabrata NOT DETECTED NOT DETECTED Final   Candida krusei NOT DETECTED NOT DETECTED Final   Candida parapsilosis NOT DETECTED NOT DETECTED Final   Candida tropicalis NOT DETECTED NOT DETECTED Final  Comment: Performed at [facility], [place] [address] [place], [place] [address]  Respiratory Panel by RT PCR (Flu A&B, Covid) - Nasopharyngeal Swab     Status: None   Collection Time: 12/23/2019 11:22 PM   Specimen: Nasopharyngeal Swab  Result Value Ref Range Status   SARS Coronavirus 2 by RT PCR NEGATIVE NEGATIVE Final    Comment: (NOTE) SARS-CoV-2 target nucleic acids are NOT DETECTED. The SARS-CoV-2 RNA is generally detectable in upper respiratoy specimens during the acute phase of infection. The lowest concentration of SARS-CoV-2 viral copies this assay can detect is 131 copies/mL. A negative result does not preclude SARS-Cov-2 infection and should not be used as the sole basis for treatment or other patient management decisions. A negative result may occur with  improper specimen collection/handling, submission of specimen other than nasopharyngeal swab, presence of viral mutation(s) within the areas targeted by this assay, and inadequate number of viral copies (<131 copies/mL). A negative result must be combined with clinical observations, patient history, and epidemiological information. The expected result is Negative. Fact Sheet for Patients:  PinkCheek.be Fact Sheet for Healthcare Providers:  GravelBags.it This test is not yet ap proved or cleared by the [place] FDA and  has been authorized for detection and/or diagnosis of SARS-CoV-2 by FDA under an Emergency Use Authorization (EUA). This EUA will remain  in effect (meaning this test can be used) for the duration of the COVID-19 declaration under Section 564(b)(1) of the Act, 21 U.S.C. section 360bbb-3(b)(1), unless the authorization  is terminated or revoked sooner.    Influenza A by PCR NEGATIVE NEGATIVE Final   Influenza B by PCR NEGATIVE NEGATIVE Final    Comment: (NOTE) The Xpert Xpress SARS-CoV-2/FLU/RSV assay is intended as an aid in  the diagnosis of influenza from Nasopharyngeal swab specimens and  should not be used as a sole basis for treatment. Nasal washings and  aspirates are unacceptable for Xpert Xpress SARS-CoV-2/FLU/RSV  testing. Fact Sheet for Patients: PinkCheek.be Fact Sheet for Healthcare Providers: GravelBags.it This test is not yet approved or cleared by the [place] FDA and  has been authorized for detection and/or diagnosis of SARS-CoV-2 by  FDA under an Emergency Use Authorization (EUA). This EUA will remain  in effect (meaning this test can be used) for the duration of the  Covid-19 declaration under Section 564(b)(1) of the Act, 21  U.S.C. section 360bbb-3(b)(1), unless the authorization is  terminated or revoked. Performed at [facility], [place] [name] [place], [place] [address]   MRSA PCR Screening     Status: Abnormal   Collection Time: 12/27/19  5:30 AM   Specimen: Nasopharyngeal  Result Value Ref Range Status   MRSA by PCR POSITIVE (A) NEGATIVE Final    Comment:        The GeneXpert MRSA Assay (FDA approved for NASAL specimens only), is one component of a comprehensive MRSA colonization surveillance program. It is not intended to diagnose MRSA infection nor to guide or monitor treatment for MRSA infections. RESULT CALLED TO, READ BACK BY AND VERIFIED WITH: RN Sarajane Jews [phone] FCP Performed at [facility] [address] [place], [place] [address]   Culture, blood (routine x 2)     Status: None (Preliminary result)   Collection Time: 12/28/19  5:40 AM   Specimen: BLOOD RIGHT HAND  Result Value Ref Range Status   Specimen Description BLOOD RIGHT HAND  Final   Special Requests    Final  BOTTLES DRAWN AEROBIC AND ANAEROBIC Blood Culture adequate volume   Culture  Setup Time   Final    ANAEROBIC BOTTLE ONLY GRAM POSITIVE COCCI IN CLUSTERS CRITICAL VALUE NOTED.  VALUE IS CONSISTENT WITH PREVIOUSLY REPORTED AND CALLED VALUE. Performed at [facility], [place] [address], [place], [place] [address]    Culture NO GROWTH 3 DAYS  Final   Report Status PENDING  Incomplete  Culture, blood (routine x 2)     Status: Abnormal   Collection Time: 12/28/19  5:59 AM   Specimen: BLOOD RIGHT HAND  Result Value Ref Range Status   Specimen Description BLOOD RIGHT HAND  Final   Special Requests IN PEDIATRIC BOTTLE Blood Culture adequate volume  Final   Culture  Setup Time   Final    GRAM POSITIVE COCCI IN PEDIATRIC BOTTLE CRITICAL RESULT CALLED TO, READ BACK BY AND VERIFIED WITH: PHARMD LINDSAY CHEN @2021  12/28/2019 AKT    Culture (A)  Final    STAPHYLOCOCCUS AUREUS SUSCEPTIBILITIES PERFORMED ON PREVIOUS CULTURE WITHIN THE LAST 5 DAYS. Performed at [facility], [place] [address] [place], [place] [address]    Report Status 12/29/2019 FINAL  Final  Culture, blood (routine x 2)     Status: None (Preliminary result)   Collection Time: 12/30/19  6:35 AM   Specimen: BLOOD  Result Value Ref Range Status   Specimen Description BLOOD RIGHT ANTECUBITAL  Final   Special Requests   Final    BOTTLES DRAWN AEROBIC AND ANAEROBIC Blood Culture adequate volume   Culture NO GROWTH < 24 HOURS  Final   Report Status PENDING  Incomplete  Culture, blood (routine x 2)     Status: None (Preliminary result)   Collection Time: 12/30/19  6:40 AM   Specimen: BLOOD RIGHT HAND  Result Value Ref Range Status   Specimen Description BLOOD RIGHT HAND  Final   Special Requests   Final    BOTTLES DRAWN AEROBIC AND ANAEROBIC Blood Culture adequate volume   Culture  Setup Time   Final    GRAM POSITIVE COCCI IN CLUSTERS ANAEROBIC BOTTLE ONLY CRITICAL VALUE NOTED.  VALUE IS CONSISTENT WITH PREVIOUSLY  REPORTED AND CALLED VALUE. Performed at [facility], [place] [address] [place], [place] [address]    Culture GRAM POSITIVE COCCI  Final   Report Status PENDING  Incomplete    Janene Madeira, MSN, NP-C [facility] Infectious Disease [email or web address] Pager: [phone] Office: [place]: [phone]

## 2019-12-31 NOTE — Progress Notes (Signed)
Occupational Therapy Evaluation Patient Details Name: Seth [name] MRN: [id number] DOB: 1966/01/23 Today's Date: 12/31/2019    History of Present Illness 54 year old male with PMH as below, which is significant for ESRD on HD, chronic hypoxemic respiratory failure on home O2, OSA on CPAP, DM, discitis, and cord compression myelopathy. Presents with back pain, hypotension, in septic shock from MRSA bacteremia.   Clinical Impression   Patient lives at home with family and typically uses a cane with mobility. Patient experiencing a lot of pain today in entire body, particularly joints with any type of movement.  He required total assist of 2 with mobility and max assist with seated UB ADLs.  Difficult to determine if function level was due to weakness or pain.  Cognitively patient stated he felt confused and not baseline.  Processing was slow and he required verbal and tactile cueing.  Will continue to follow with OT acutely to address the deficits listed below.      Follow Up Recommendations  SNF    Equipment Recommendations  3 in 1 bedside commode    Recommendations for Other Services       Precautions / Restrictions Precautions Precautions: Fall Restrictions Weight Bearing Restrictions: No      Mobility Bed Mobility Overal bed mobility: Needs Assistance Bed Mobility: Rolling;Supine to Sit;Sit to Supine Rolling: Total assist;+2 for physical assistance   Supine to sit: Total assist;+2 for physical assistance Sit to supine: Total assist;+2 for physical assistance      Transfers                 General transfer comment: Did not attempt    Balance Overall balance assessment: Needs assistance Sitting-balance support: Feet supported;Bilateral upper extremity supported Sitting balance-Leahy Scale: Poor Sitting balance - Comments: Requiring mod assist to keep seated at EOB                                   ADL either performed or assessed with  clinical judgement   ADL Overall ADL's : Needs assistance/impaired Eating/Feeding: Sitting;Maximal assistance   Grooming: Maximal assistance;Sitting   Upper Body Bathing: Total assistance;Sitting   Lower Body Bathing: Total assistance;Sitting/lateral leans   Upper Body Dressing : Maximal assistance;Sitting   Lower Body Dressing: Total assistance;Sitting/lateral leans               Functional mobility during ADLs: Total assistance(with bed mobility) General ADL Comments: Only sat EOB.  Patient in a lot of pain and weak, unable to attempt stand     Vision         Perception     Praxis      Pertinent Vitals/Pain Pain Assessment: Faces Pain Score: 9  Faces Pain Scale: Hurts whole lot Pain Location: Pain in all joints with any movement  Pain Descriptors / Indicators: Grimacing;Radiating Pain Intervention(s): Limited activity within patient's tolerance;Monitored during session;Repositioned     Hand Dominance Left   Extremity/Trunk Assessment Upper Extremity Assessment Upper Extremity Assessment: RUE deficits/detail;LUE deficits/detail RUE Deficits / Details: Overall strength 2/5 : difficult to determine, though , if weakness or if inabillity to move due to pain. RUE: Unable to fully assess due to pain RUE Coordination: decreased fine motor;decreased gross motor LUE Deficits / Details: Overall strength 2/5 : difficult to determine, though , if weakness or if inabillity to move due to pain. LUE: Unable to fully assess due to pain LUE Coordination: decreased  gross motor;decreased fine motor           Communication Communication Communication: No difficulties;Other (comment)(Slight expressive difficulty )   Cognition Arousal/Alertness: Awake/alert Behavior During Therapy: Anxious Overall Cognitive Status: Impaired/Different from baseline Area of Impairment: Following commands;Safety/judgement;Awareness;Problem solving;Attention                   Current  Attention Level: Selective   Following Commands: Follows one step commands inconsistently Safety/Judgement: Decreased awareness of safety Awareness: Emergent Problem Solving: Slow processing;Decreased initiation;Difficulty sequencing;Requires verbal cues;Requires tactile cues General Comments: Patient stating he felt confused.     General Comments  Patient on 4L O2 throughout session.  SpO2 96 resting and 92 seated EOB    Exercises     Shoulder Instructions      Home Living Family/patient expects to be discharged to:: Private residence Living Arrangements: Spouse/significant other;Children Available Help at Discharge: Family;Available PRN/intermittently Type of Home: House Home Access: Stairs to enter [employer] of Steps: 2 Entrance Stairs-Rails: None Home Layout: One level     Bathroom Shower/Tub: Occupational psychologist: Standard     Home Equipment: Grab bars - tub/shower;Cane - single point   Additional Comments: uses cane primarily, does not have RW      Prior Functioning/Environment Level of Independence: Independent with assistive device(s)        Comments: uses cane for mobility, completes ADLs, +drives          OT Problem List: Decreased strength;Decreased range of motion;Decreased activity tolerance;Impaired balance (sitting and/or standing);Decreased cognition;Decreased coordination;Decreased safety awareness;Decreased knowledge of use of DME or AE;Cardiopulmonary status limiting activity;Pain      OT Treatment/Interventions: Therapeutic exercise;Self-care/ADL training;Energy conservation;DME and/or AE instruction;Therapeutic activities;Cognitive remediation/compensation;Patient/family education;Balance training    OT Goals(Current goals can be found in the care plan section) Acute Rehab OT Goals Patient Stated Goal: To stand up OT Goal Formulation: With patient Time For Goal Achievement: 01/14/20 Potential to Achieve Goals: Fair   OT Frequency: Min 2X/week   Barriers to D/C:            Co-evaluation PT/OT/SLP Co-Evaluation/Treatment: Yes Reason for Co-Treatment: Complexity of the patient's impairments (multi-system involvement);For patient/therapist safety;To address functional/ADL transfers   OT goals addressed during session: ADL's and self-care;Strengthening/ROM      AM-PAC OT "6 Clicks" Daily Activity     Outcome Measure Help from another person eating meals?: A Lot Help from another person taking care of personal grooming?: A Lot Help from another person toileting, which includes using toliet, bedpan, or urinal?: Total Help from another person bathing (including washing, rinsing, drying)?: Total Help from another person to put on and taking off regular upper body clothing?: A Lot Help from another person to put on and taking off regular lower body clothing?: Total 6 Click Score: 9   End of Session Equipment Utilized During Treatment: Oxygen Nurse Communication: Mobility status  Activity Tolerance: Patient limited by pain;Patient limited by fatigue Patient left: in bed;with bed alarm set;with call bell/phone within reach  OT Visit Diagnosis: Muscle weakness (generalized) (M62.81);Unsteadiness on feet (R26.81);Other symptoms and signs involving cognitive function;Pain Pain - Right/Left: (All over) Pain - part of body: Shoulder;Arm;Hand;Hip;Knee;Leg;Ankle and joints of foot                Time: 1448-1856 OT Time Calculation (min): 32 min Charges:  OT General Charges $OT Visit: 1 Visit OT Evaluation $OT Eval Moderate Complexity: 1 [address], OTR/L    Phylliss Bob 12/31/2019,  12:35 PM

## 2019-12-31 NOTE — Progress Notes (Signed)
ID Pharmacy Note   Noted that Mr. Bienvenue dialysis was re-scheduled for this afternoon. I spoke with both Mr. Hustead nurse and dialysis nurse and confirmed that he will receive 1 gm of Vancomycin after dialysis today. Pharmacy will monitor dialysis sessions closely and consider another pre-HD level late this week or early next to monitor clearance.    Jimmy Footman, PharmD, BCPS, BCIDP Infectious Diseases Clinical Pharmacist Phone: [phone] 12/31/2019 2:16 PM

## 2019-12-31 NOTE — Evaluation (Signed)
Physical Therapy Evaluation Patient Details Name: Seth [name] MRN: [id number] DOB: [birth year] Today's Date: 12/31/2019   History of Present Illness  54 year old male with PMH as below, which is significant for ESRD on HD, chronic hypoxemic respiratory failure on home O2, OSA on CPAP, DM, discitis, and cord compression myelopathy. Presents with back pain, hypotension, in septic shock from MRSA bacteremia.  Clinical Impression  Pt admitted with above diagnosis. Pt with pain all joints with mvmt and to touch that is significantly limiting mobility at this point. Tot A +2 for bed mobility and pt unable to stand or ambulate. Unable to d/c home in current state.  Pt currently with functional limitations due to the deficits listed below (see PT Problem List). Pt will benefit from skilled PT to increase their independence and safety with mobility to allow discharge to the venue listed below.       Follow Up Recommendations SNF;Supervision/Assistance - 24 hour    Equipment Recommendations  Rolling walker with 5" wheels    Recommendations for Other Services       Precautions / Restrictions Precautions Precautions: Fall Restrictions Weight Bearing Restrictions: No      Mobility  Bed Mobility Overal bed mobility: Needs Assistance Bed Mobility: Rolling;Supine to Sit;Sit to Supine Rolling: Total assist;+2 for physical assistance   Supine to sit: Total assist;+2 for physical assistance Sit to supine: Total assist;+2 for physical assistance   General bed mobility comments: pt resistant to moving due to pain all over. Tot A needed for all mobility  Transfers                 General transfer comment: Did not attempt due to pain and weakness  Ambulation/Gait             General Gait Details: unable  Stairs            Wheelchair Mobility    Modified Rankin (Stroke Patients Only)       Balance Overall balance assessment: Needs assistance Sitting-balance  support: Feet supported;Bilateral upper extremity supported Sitting balance-Leahy Scale: Poor Sitting balance - Comments: Requiring mod assist to keep seated at EOB Postural control: Posterior lean                                   Pertinent Vitals/Pain Pain Assessment: Faces Pain Score: 9  Faces Pain Scale: Hurts whole lot Pain Location: Pain in all joints with any movement  Pain Descriptors / Indicators: Grimacing;Radiating Pain Intervention(s): Limited activity within patient's tolerance;Monitored during session    Home Living Family/patient expects to be discharged to:: Private residence Living Arrangements: Spouse/significant other;Children Available Help at Discharge: Family;Available PRN/intermittently Type of Home: House Home Access: Stairs to enter Entrance Stairs-Rails: None Entrance Stairs-Number of Steps: 2 Home Layout: One level Home Equipment: Grab bars - tub/shower;Cane - single point Additional Comments: IT consultant arrived during session with restraining order for pt to prevent him speaking to his daughter. He was quite upset about this.     Prior Function Level of Independence: Independent with assistive device(s)         Comments: uses cane for mobility, completes ADLs, +drives       Hand Dominance   Dominant Hand: Left    Extremity/Trunk Assessment   Upper Extremity Assessment Upper Extremity Assessment: Defer to OT evaluation RUE Deficits / Details: Overall strength 2/5 : difficult to determine, though , if weakness or  if inabillity to move due to pain. RUE: Unable to fully assess due to pain RUE Coordination: decreased fine motor;decreased gross motor LUE Deficits / Details: Overall strength 2/5 : difficult to determine, though , if weakness or if inabillity to move due to pain. LUE: Unable to fully assess due to pain LUE Coordination: decreased gross motor;decreased fine motor    Lower Extremity Assessment Lower Extremity  Assessment: Generalized weakness;RLE deficits/detail;LLE deficits/detail RLE Deficits / Details: generally 2-/5 and painful RLE: Unable to fully assess due to pain RLE Sensation: (hypersensitivity) RLE Coordination: decreased gross motor LLE Deficits / Details: generally 2-/5 and painful LLE: Unable to fully assess due to pain LLE Sensation: (hypersensitivity) LLE Coordination: decreased gross motor    Cervical / Trunk Assessment Cervical / Trunk Assessment: Kyphotic  Communication   Communication: Expressive difficulties(mild)  Cognition Arousal/Alertness: Awake/alert Behavior During Therapy: Anxious Overall Cognitive Status: Impaired/Different from baseline Area of Impairment: Following commands;Safety/judgement;Awareness;Problem solving;Attention                   Current Attention Level: Selective   Following Commands: Follows one step commands inconsistently Safety/Judgement: Decreased awareness of safety Awareness: Emergent Problem Solving: Slow processing;Decreased initiation;Difficulty sequencing;Requires verbal cues;Requires tactile cues General Comments: Patient stating he felt confused and had some difficulty following conversation      General Comments General comments (skin integrity, edema, etc.): pt unable to reposition self EOB. Pt on 4L O2 (same at home) with SPO2 96% at rest, 92% EOB    Exercises     Assessment/Plan    PT Assessment Patient needs continued PT services  PT Problem List Decreased strength;Decreased activity tolerance;Decreased range of motion;Decreased balance;Decreased mobility;Decreased coordination;Decreased cognition;Decreased knowledge of use of DME;Decreased safety awareness;Decreased knowledge of precautions;Pain;Cardiopulmonary status limiting activity;Obesity       PT Treatment Interventions DME instruction;Gait training;Functional mobility training;Therapeutic activities;Therapeutic exercise;Balance training;Neuromuscular  re-education;Cognitive remediation;Patient/family education    PT Goals (Current goals can be found in the Care Plan section)  Acute Rehab PT Goals Patient Stated Goal: return home PT Goal Formulation: With patient Time For Goal Achievement: 01/14/20 Potential to Achieve Goals: Fair    Frequency Min 2X/week   Barriers to discharge        Co-evaluation PT/OT/SLP Co-Evaluation/Treatment: Yes Reason for Co-Treatment: Complexity of the patient's impairments (multi-system involvement);For patient/therapist safety PT goals addressed during session: Mobility/safety with mobility;Balance OT goals addressed during session: ADL's and self-care;Strengthening/ROM       AM-PAC PT "6 Clicks" Mobility  Outcome Measure Help needed turning from your back to your side while in a flat bed without using bedrails?: Total Help needed moving from lying on your back to sitting on the side of a flat bed without using bedrails?: Total Help needed moving to and from a bed to a chair (including a wheelchair)?: Total Help needed standing up from a chair using your arms (e.g., wheelchair or bedside chair)?: Total Help needed to walk in hospital room?: Total Help needed climbing 3-5 steps with a railing? : Total 6 Click Score: 6    End of Session   Activity Tolerance: Patient limited by pain Patient left: in bed;with call bell/phone within reach Nurse Communication: Mobility status PT Visit Diagnosis: Unsteadiness on feet (R26.81);Muscle weakness (generalized) (M62.81);Difficulty in walking, not elsewhere classified (R26.2);Pain Pain - part of body: Shoulder;Arm;Hand;Hip;Knee;Leg;Ankle and joints of foot    Time: 3893-7342 PT Time Calculation (min) (ACUTE ONLY): 31 min   Charges:   PT Evaluation $PT Eval Moderate Complexity: 1 Mod  Leighton Roach, [place]  Pager [phone] Office [place] 12/31/2019, 1:14 PM

## 2019-12-31 NOTE — Progress Notes (Signed)
[facility] Progress Note   Assessment/ Plan:    1. Septic shock 2/2 MRSA bacteremia: (3rd episode in past year) on vancomycin, off pressors now.  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  TDC has been removed (think that's the source).  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  Repeat blood cultures + 12/28/2018, 3/22 pending, will need endocarditis eval,  Cardiology planned TEE today but I think he looks poorly and would like to prioritize dialysis, see below.  MRI back no e/o infection (suboptimal due to motion).    2.  ESRD: [facility]-- missed 2 sessions, HD yesterday 3.20, next planned for Monday.  Had used AVF 6x as OP, used 2 16 g needles successfully 3/21.  Failed cannulation 15g 3/22 -- AVF with good thrill and bruit, will try cannulating with 16g today and if unsuccessful will need temp cath placed today for HD as he looks uremic today.  Using midodrine to augment BP, increased to 15 mg TID.     3.  DM II: per primary  4.  Hyponatremia: improving with HD - set Na 135 today. On fluid restriction and serum glucose ok. CTM.  5.  Anemia: hgb 9.5, no IV iron in setting of bacteremia.  Add on low dose aranesp tomorrow if still < 10.   6. BMMD: Phos up to 6.1, added back Auryxia  7.  Dispo: admitted  Subjective:    Was unable to have HD yesterday due to inability to cannulate AVF.  This AM he seems a bit lethargic but denies issues.    Objective:   BP (!) 106/54   Pulse 86   Temp 99.5 F (37.5 C) (Oral)   Resp 18   Ht 5\' 8"  (1.727 m)   Wt 128.7 kg   SpO2 97%   BMI 43.14 kg/m   Physical Exam: GEN NAD, lethargic HEENT EOMI PERRL NECK no JVD PULM clear bilaterally no c/w/r CV RRR ABD obese EXT no ankle edema NEURO AAO x 3 a bit lethargic and having some myoclonic jerking SKIN no rashes MSK L wrist as above ACCESS: LUE AVF +T/B, resolving infiltration - sizeable lateral hematoma  Labs: BMET Recent Labs  Lab 12/25/2019 2220 12/18/2019 2220  12/23/2019 2245 12/27/19 0434 12/27/19 2253 12/28/19 0818 12/29/19 0751 12/30/19 0644 12/31/19 0428  NA 125*   < > 124* 128* 123* 125* 129* 127* 126*  K 5.6*   < > 5.4* 5.3* 5.5* 6.3* 4.7 4.9 5.0  CL 86*  --   --  89* 87* 87* 90* 88* 87*  CO2 17*  --   --  17* 17* 19* 20* 18* 22  GLUCOSE 137*  --   --  115* 120* 114* 135* 147* 144*  BUN 62*  --   --  62* 74* 81* 53* 79* 102*  CREATININE 10.21*  --   --  9.93* 10.50* 10.97* 6.56* 7.47* 8.57*  CALCIUM 8.3*  --   --  8.2* 7.6* 7.6* 8.7* 8.6* 8.8*  PHOS  --   --   --  6.0* 6.1* 7.0* 6.1* 6.3* 6.4*   < > = values in this interval not displayed.   CBC Recent Labs  Lab 12/28/19 0818 12/29/19 0751 12/30/19 0644 12/31/19 0428  WBC 18.0* 19.9* 24.9* 29.1*  NEUTROABS 15.1* 16.5* 21.4* 25.0*  HGB 10.7* 10.5* 9.5* 9.2*  HCT 32.2* 31.1* 27.7* 26.9*  MCV 87.3 85.9 84.7 84.9  PLT 273 289 329 342  Medications:    . Chlorhexidine Gluconate Cloth  6 each Topical Q0600  . ferric citrate  420 mg Oral TID WC  . heparin  5,000 Units Subcutaneous Q8H  . insulin aspart  0-20 Units Subcutaneous TID WC  . lidocaine  15 mL Oral Once  . midodrine  15 mg Oral TID WC  . pantoprazole  40 mg Oral Q0600  . vancomycin variable dose per unstable renal function (pharmacist dosing)   Does not apply See admin instructions

## 2020-01-01 ENCOUNTER — Encounter (HOSPITAL_COMMUNITY): Payer: Self-pay | Admitting: Internal Medicine

## 2020-01-01 ENCOUNTER — Inpatient Hospital Stay (HOSPITAL_COMMUNITY): Payer: Medicare Other

## 2020-01-01 ENCOUNTER — Encounter (HOSPITAL_COMMUNITY): Admission: EM | Disposition: E | Payer: Self-pay | Source: Home / Self Care | Attending: Internal Medicine

## 2020-01-01 ENCOUNTER — Encounter: Payer: Self-pay | Admitting: *Deleted

## 2020-01-01 DIAGNOSIS — N186 End stage renal disease: Secondary | ICD-10-CM

## 2020-01-01 DIAGNOSIS — B9562 Methicillin resistant Staphylococcus aureus infection as the cause of diseases classified elsewhere: Secondary | ICD-10-CM | POA: Diagnosis not present

## 2020-01-01 DIAGNOSIS — R7881 Bacteremia: Secondary | ICD-10-CM | POA: Diagnosis not present

## 2020-01-01 LAB — CBC WITH DIFFERENTIAL/PLATELET
Abs Immature Granulocytes: 3.25 10*3/uL — ABNORMAL HIGH (ref 0.00–0.07)
Basophils Absolute: 0.1 10*3/uL (ref 0.0–0.1)
Basophils Relative: 0 %
Eosinophils Absolute: 0.5 10*3/uL (ref 0.0–0.5)
Eosinophils Relative: 2 %
HCT: 26.7 % — ABNORMAL LOW (ref 39.0–52.0)
Hemoglobin: 9 g/dL — ABNORMAL LOW (ref 13.0–17.0)
Immature Granulocytes: 10 %
Lymphocytes Relative: 6 %
Lymphs Abs: 1.9 10*3/uL (ref 0.7–4.0)
MCH: 28.8 pg (ref 26.0–34.0)
MCHC: 33.7 g/dL (ref 30.0–36.0)
MCV: 85.3 fL (ref 80.0–100.0)
Monocytes Absolute: 1.7 10*3/uL — ABNORMAL HIGH (ref 0.1–1.0)
Monocytes Relative: 5 %
Neutro Abs: 25.1 10*3/uL — ABNORMAL HIGH (ref 1.7–7.7)
Neutrophils Relative %: 77 %
Platelets: 376 10*3/uL (ref 150–400)
RBC: 3.13 MIL/uL — ABNORMAL LOW (ref 4.22–5.81)
RDW: 16.7 % — ABNORMAL HIGH (ref 11.5–15.5)
WBC: 32.5 10*3/uL — ABNORMAL HIGH (ref 4.0–10.5)
nRBC: 0.2 % (ref 0.0–0.2)

## 2020-01-01 LAB — RENAL FUNCTION PANEL
Albumin: 1.7 g/dL — ABNORMAL LOW (ref 3.5–5.0)
Anion gap: 17 — ABNORMAL HIGH (ref 5–15)
BUN: 56 mg/dL — ABNORMAL HIGH (ref 6–20)
CO2: 22 mmol/L (ref 22–32)
Calcium: 8.8 mg/dL — ABNORMAL LOW (ref 8.9–10.3)
Chloride: 91 mmol/L — ABNORMAL LOW (ref 98–111)
Creatinine, Ser: 5.47 mg/dL — ABNORMAL HIGH (ref 0.61–1.24)
GFR calc Af Amer: 13 mL/min — ABNORMAL LOW (ref >60)
GFR calc non Af Amer: 11 mL/min — ABNORMAL LOW (ref >60)
Glucose, Bld: 170 mg/dL — ABNORMAL HIGH (ref 70–99)
Phosphorus: 5.7 mg/dL — ABNORMAL HIGH (ref 2.5–4.6)
Potassium: 5.3 mmol/L — ABNORMAL HIGH (ref 3.5–5.1)
Sodium: 130 mmol/L — ABNORMAL LOW (ref 135–145)

## 2020-01-01 LAB — GLUCOSE, CAPILLARY
Glucose-Capillary: 157 mg/dL — ABNORMAL HIGH (ref 70–99)
Glucose-Capillary: 169 mg/dL — ABNORMAL HIGH (ref 70–99)
Glucose-Capillary: 179 mg/dL — ABNORMAL HIGH (ref 70–99)
Glucose-Capillary: 191 mg/dL — ABNORMAL HIGH (ref 70–99)
Glucose-Capillary: 217 mg/dL — ABNORMAL HIGH (ref 70–99)

## 2020-01-01 LAB — HEPATITIS B SURFACE ANTIGEN: Hepatitis B Surface Ag: NONREACTIVE

## 2020-01-01 SURGERY — CANCELLED PROCEDURE

## 2020-01-01 MED ORDER — FENTANYL CITRATE (PF) 100 MCG/2ML IJ SOLN
INTRAMUSCULAR | Status: AC
  Filled 2020-01-01: qty 2

## 2020-01-01 MED ORDER — VANCOMYCIN HCL IN DEXTROSE 1-5 GM/200ML-% IV SOLN: 1000.0000 mg | INTRAVENOUS | Status: AC

## 2020-01-01 MED ORDER — VANCOMYCIN HCL IN DEXTROSE 1-5 GM/200ML-% IV SOLN
1000.0000 mg | INTRAVENOUS | Status: DC
  Filled 2020-01-01: qty 200

## 2020-01-01 MED ORDER — VANCOMYCIN HCL IN DEXTROSE 1-5 GM/200ML-% IV SOLN
INTRAVENOUS | Status: AC
  Administered 2020-01-01: 1000 mg via INTRAVENOUS
  Filled 2020-01-01: qty 200

## 2020-01-01 MED ORDER — HEPARIN SODIUM (PORCINE) 1000 UNIT/ML IJ SOLN
INTRAMUSCULAR | Status: AC
  Administered 2020-01-01: 5000 [IU] via INTRAVENOUS_CENTRAL
  Filled 2020-01-01: qty 5

## 2020-01-01 MED ORDER — HEPARIN SODIUM (PORCINE) 1000 UNIT/ML IJ SOLN
INTRAMUSCULAR | Status: AC
  Filled 2020-01-01: qty 5

## 2020-01-01 NOTE — Progress Notes (Signed)
[facility] Progress Note   Assessment/ Plan:    1. Septic shock 2/2 MRSA bacteremia: (3rd episode in past year) on vancomycin, off pressors now.  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  TDC has been removed (think that's the source).  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  Repeat blood cultures + 12/28/2018, 3/22 +, 3/23 pending.  Will need endocarditis eval,  Cardiology planned TEE today but no IV so cancelled.  MRI back no e/o infection (suboptimal due to motion).  ID following - repeat Chest imaging and MSK due to hypoxia and ongoing joint pains(? Septic emboli).   2.  ESRD: MWF [facility]-- missed 2 sessions, HD yesterday 3.20, next planned for Monday.  Had used AVF 6x as OP, used 2 16 g needles successfully 3/21.  Failed cannulation 15g 3/22 but used 16g 3/23.  Will plan for HD today 3/24 as schedule allows - ok if deferred until tomorrow.  Will trial 15g again -- PVL depth and diameter look ok distally, proximal AVF deep (1cm).  Uses midodrine to augment BP, increased to 15 mg TID.     3.  DM II: per primary  4.  Hyponatremia: improving with HD - set Na 135 today. On fluid restriction and serum glucose ok. CTM.  5.  Anemia: hgb 9.5, no IV iron in setting of bacteremia.  Added aranesp 60 weekly 3/24.  6. BMMD: Phos up to 6.1, added back Auryxia  7.  Malnutrition:albumin 1.7, prostat BID  7.  Dispo: admitted  Subjective:    Had full HD yesterday with 16g needles. UF 2.6L.  NPO this AM for TEE --> seen just prior to discharge.  Having ongoing pain in joints.   Worsening leukocytosis, Tm 99.3.   Objective:   BP (!) 107/50   Pulse 96   Temp 99.2 F (37.3 C) (Oral)   Resp (!) 27   Ht 5\' 8"  (1.727 m)   Wt 127.7 kg   SpO2 97%   BMI 42.81 kg/m   Physical Exam: GEN NAD, lethargic HEENT EOMI PERRL NECK no JVD PULM clear bilaterally no c/w/r CV RRR ABD obese EXT no ankle edema NEURO AAO x 3 a bit lethargic and having some myoclonic jerking SKIN no  rashes MSK L wrist as above ACCESS: LUE AVF +T/B, resolving infiltration - sizeable lateral hematoma  Labs: BMET Recent Labs  Lab 12/27/19 0434 12/27/19 2253 12/28/19 0818 12/29/19 0751 12/30/19 0644 12/31/19 0428 01/05/2020 0245  NA 128* 123* 125* 129* 127* 126* 130*  K 5.3* 5.5* 6.3* 4.7 4.9 5.0 5.3*  CL 89* 87* 87* 90* 88* 87* 91*  CO2 17* 17* 19* 20* 18* 22 22  GLUCOSE 115* 120* 114* 135* 147* 144* 170*  BUN 62* 74* 81* 53* 79* 102* 56*  CREATININE 9.93* 10.50* 10.97* 6.56* 7.47* 8.57* 5.47*  CALCIUM 8.2* 7.6* 7.6* 8.7* 8.6* 8.8* 8.8*  PHOS 6.0* 6.1* 7.0* 6.1* 6.3* 6.4* 5.7*   CBC Recent Labs  Lab 12/29/19 0751 12/30/19 0644 12/31/19 0428 12/31/2019 0245  WBC 19.9* 24.9* 29.1* 32.5*  NEUTROABS 16.5* 21.4* 25.0* 25.1*  HGB 10.5* 9.5* 9.2* 9.0*  HCT 31.1* 27.7* 26.9* 26.7*  MCV 85.9 84.7 84.9 85.3  PLT 289 329 342 376      Medications:    . Chlorhexidine Gluconate Cloth  6 each Topical Q0600  . ferric citrate  420 mg Oral TID WC  . heparin      . heparin  5,000 Units Subcutaneous  Q8H  . insulin aspart  0-20 Units Subcutaneous TID WC  . lidocaine  15 mL Oral Once  . midodrine  15 mg Oral TID WC  . pantoprazole  40 mg Oral Q0600  . vancomycin variable dose per unstable renal function (pharmacist dosing)   Does not apply See admin instructions

## 2020-01-01 NOTE — H&P (Signed)
Seth [name] is a 54 y.o. male who has presented today for surgery, with the diagnosis of MRSA bacteremia.  The various methods of treatment have been discussed with the patient and family. After consideration of risks, benefits and other options for treatment, the patient has consented to  Procedure(s): TRANSESOPHAGEAL ECHOCARDIOGRAM (TEE) (N/A) as a surgical intervention .  The patient's history has been reviewed, patient examined, no change in status, stable for surgery.  I have reviewed the patient's chart and labs.  Questions were answered to the patient's satisfaction.    Samiel Peel C. Oval Linsey, MD, [facility]  12/17/2019 12:37 PM

## 2020-01-01 NOTE — Progress Notes (Signed)
VAST consulted to obtain IV access after previously access lost in Endoscopy. Assessed patient's right arm utilizing ultrasound. One appropriate vessel noted in patient's upper arm (cephalic). As nurse was cleaning arm for IV start, patient stated "No. You are not sticking me today." Reminded patient his procedure could not be completed without an IV and he stated it would have to wait until tomorrow. Pt then requested food and drink. Advised him he cannot have anything at this time. Unit nurse notified.

## 2020-01-01 NOTE — Progress Notes (Signed)
MD paged d/t no procedures, or labwork could be obtained d/t loss of iv access and extreme difficult stick. Pt refused latter attempt for lab work d/t stating "been stuck too many times today". Pt request for a diet so that he can eat and drink. MD paged to make aware of patient request and update.

## 2020-01-01 NOTE — Progress Notes (Signed)
AVF duplex completed. Refer to "CV Proc" under chart review to view preliminary results.  12/09/2019 11:13 AM Kelby Aline., MHA, RVT, RDCS, RDMS

## 2020-01-01 NOTE — Progress Notes (Signed)
PROGRESS NOTE    Seth [name]  ION:[id number] DOB: [birth year] DOA: 12/19/2019 PCP: Mitzi Hansen, MD    Brief Narrative: 54 year old male with PMH as below, which is significant for ESRD on HD, chronic hypoxemic respiratory failure no home O2, OSA on CPAP, DM, discitis, and cord compression myelopathy. Most recent HD was 3/17. He left the session 45 mins early due to back pain. He was in his usual state of health until approximately 3/11 when he developed muscle aches and back pain. He had just received his 1st COVID-19 vaccine a few days prior and it was felt these symptoms were secondary to this. He has been seen twice in the ED in the past week. First for complaints of febrile illness. He was not given any antibiotics at that time. He then returned 3/18 with complaints of myalgias. He returned to ED later in the day 3/18 via EMS after being found to have altered mental status at home. Upon arrival to ED he was noted to be hypotensive with SBP in the 60s and hypoxemic with O2 sats in the 70s on room air. Improved with supplemental O2 and levophed infusion after 237m bolus. There were also concerns he had been unable to use his legs the past few days and MRI spine was ordered. PCCM asked to evaluate for admission due to need for vasoactive infusion.   Assessment & Plan:   Principal Problem:   MRSA bacteremia Active Problems:   Hyperlipidemia associated with type 2 diabetes mellitus ([facility] [place]   ESRD (end stage renal disease) on dialysis ([name])   Absolute anemia   DM (diabetes mellitus) type 2, uncontrolled, with ketoacidosis ([name])   Hyperkalemia   Hyponatremia   Septic shock ([name])   Sepsis due to infected intravenous catheter ([name])   Wrist pain, acute, left  Septic shock secondary to MRSA bacteremia secondary to infected HD catheter/ Patient had presented noted to be in septic shock requiring pressors felt secondary to infected HD catheter.  Infected HD catheter removed 12/27/2019  with purulence noted.  Patient remains off pressors. MRSA PCR positive.  Blood cultures positive for MRSA.  2D echo negative for any vegetations/endocarditis.   Repeat blood cultures from 12/28/2019 + for staph aureus. 2 of 5 blood cultures 3/22 remain positive.  TEE pending. Midodrine dose increased to 15 mg 3 times daily per nephrology.  Continue current regimen of IV vancomycin.    Hyperkalemia/hyponatremia/acidosis/hyperphosphatemia Secondary to end-stage renal disease.  Status post Lokelma.  Potassium at 5 this morning.  Sodium levels fluctuating currently at 126.  Phosphorus at 6.4.  Patient with end-stage renal disease unable to undergo hemodialysis 3/22 due to failed cannulation.  Patient also due for hemodialysis today.  Per nephrology.   Lower extremity weakness Patient still with lower extremity weakness with slight improvement.  Initially unable to obtain MRI due to claustrophobia however was given some Ativan and MRI obtained earlier this morning 12/29/2019.  MRI of the L-spine obtained with no acute abnormality or evidence for infection within the lumbar spine, technically limited exam due to motion artifact.  10 mm anterior listhesis of L5 on S1 with associated moderate bilateral subarticular stenosis, with moderate to severe bilateral L5 foraminal narrowing.  Right foraminal to extraforaminal disc protrusion at L3-4 potentially affecting the exiting right L3 nerve root.  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  PT/OT evaluation ongoin.   Diabetes mellitus II, uncontrolled Hemoglobin A1c of 7.1.  CBG of 153 this  morning.  Continue sliding scale insulin.  End-stage renal disease with concurrent uremia, MWF schedule Patient being followed by nephrology.  Patient unable to undergo hemodialysis 3/22 due to failed cannulation.  Nephrology to re-evaluate vascular access as indicated, continue scheduled dialysis MWF.  OSA CPAP  as tolerated  Hypertension, essential - currently borderline hypotensive Midodrine 15 mg 3 times daily per nephrology.  Continue to hold antihypertensives as indicated   DVT prophylaxis: Heparin Code Status: Full Family Communication: Updated patient.   Disposition Plan:  . Patient came from: Home        . Anticipated d/c place: Undetermined - SNF vs Home with home health . Barriers to d/c OR conditions which need to be met to effect a safe d/c: Likely home with home health versus SNF pending PT evaluation, once clinically improved, improvement with hypotension/sepsis, electrolyte abnormalities corrected, improving leukocytosis, pending TEE, cleared by ID and nephrology.   Consultants:   Patient admitted to critical care service  ID: Dr.Comer 12/27/2019  Nephrology: Dr. Hollie Salk 12/27/2019  Procedures:   CT left wrist 12/27/2019  CT angiogram chest 12/09/2019  Plain films of the left wrist 12/27/2019  Chest x-ray 12/25/2019  Incomplete MRI T and L-spine 12/27/2019--- patient could not tolerate due to claustrophobia.  2D echo 12/27/2019  Successful removal of tunneled left IJ hemodialysis catheter with purulence expressed from tract per Ascencion Dike, PA interventional radiology 12/27/2019  Antimicrobials:  IV Zosyn 12/27/2019 x1  IV vancomycin 01/05/2020   Subjective: No acute issues or events overnight, patient awake alert oriented this morning denies chest pain, shortness of breath, nausea, vomiting, diarrhea, constipation, headache, fevers, chills.  Objective: Vitals:   12/31/19 1604 12/31/19 1608 12/31/19 2233 01/06/2020 0802  BP: 99/72 113/62 111/62 105/62  Pulse: 95 90 92 94  Resp: (!) 23 (!) 23 (!) 31 (!) 31  Temp:  (!) 97.2 F (36.2 C) 98.5 F (36.9 C) 98.8 F (37.1 C)  TempSrc:  Oral Oral Oral  SpO2: 100% 100% 96% 99%  Weight:      Height:        Intake/Output Summary (Last 24 hours) at 12/24/2019 0848 Last data filed at 12/31/2019 1604 Gross per 24 hour    Intake 1147.68 ml  Output 2631 ml  Net -1483.32 ml   Filed Weights   12/30/19 0403 12/31/19 0350 12/31/19 1140  Weight: 135.1 kg 128.7 kg 127.7 kg    Examination:  General exam: NAD.  Respiratory system: CTAB anterior lung fields with no wheezes, no crackles, no rhonchi.  Normal respiratory effort.   Cardiovascular system: Regular rate rhythm no murmurs rubs or gallops.  No JVD.  No lower extremity edema.  Gastrointestinal system: Abdomen is soft, nontender, nondistended, positive bowel sounds.  No rebound.  No guarding.   Central nervous system: Somewhat drowsy however following commands moving extremities.   Extremities: 3-4/5 bilateral lower extremity strength.  Skin: No rashes, lesions or ulcers  Data Reviewed: I have personally reviewed following labs and imaging studies  CBC: Recent Labs  Lab 12/28/19 0818 12/29/19 0751 12/30/19 0644 12/31/19 0428 01/04/2020 0245  WBC 18.0* 19.9* 24.9* 29.1* 32.5*  NEUTROABS 15.1* 16.5* 21.4* 25.0* 25.1*  HGB 10.7* 10.5* 9.5* 9.2* 9.0*  HCT 32.2* 31.1* 27.7* 26.9* 26.7*  MCV 87.3 85.9 84.7 84.9 85.3  PLT 273 289 329 342 774   Basic Metabolic Panel: Recent Labs  Lab 12/27/19 0434 12/27/19 2253 12/28/19 0818 12/29/19 0751 12/30/19 0644 12/31/19 0428 12/23/2019 0245  NA 128*   < > 125*  129* 127* 126* 130*  K 5.3*   < > 6.3* 4.7 4.9 5.0 5.3*  CL 89*   < > 87* 90* 88* 87* 91*  CO2 17*   < > 19* 20* 18* 22 22  GLUCOSE 115*   < > 114* 135* 147* 144* 170*  BUN 62*   < > 81* 53* 79* 102* 56*  CREATININE 9.93*   < > 10.97* 6.56* 7.47* 8.57* 5.47*  CALCIUM 8.2*   < > 7.6* 8.7* 8.6* 8.8* 8.8*  MG 1.6*  --  2.0 1.9  --   --   --   PHOS 6.0*   < > 7.0* 6.1* 6.3* 6.4* 5.7*   < > = values in this interval not displayed.   GFR: Estimated Creatinine Clearance: 19.9 mL/min (A) (by C-G formula based on SCr of 5.47 mg/dL (H)). Liver Function Tests: Recent Labs  Lab 12/30/2019 2220 12/27/19 2253 12/28/19 0818 12/29/19 0751 12/30/19 0644  12/31/19 0428 12/11/2019 0245  AST 52*  --   --   --   --   --   --   ALT 31  --   --   --   --   --   --   ALKPHOS 213*  --   --   --   --   --   --   BILITOT 1.1  --   --   --   --   --   --   PROT 7.4  --   --   --   --   --   --   ALBUMIN 2.2*   < > 1.8* 1.7* 1.6* 1.6* 1.7*   < > = values in this interval not displayed.   No results for input(s): LIPASE, AMYLASE in the last 168 hours. No results for input(s): AMMONIA in the last 168 hours. Coagulation Profile: Recent Labs  Lab 12/27/19 0010  INR 1.4*   Recent Labs  Lab 12/31/19 0758 12/31/19 1701 12/31/19 1938 12/15/2019 0214 12/24/2019 0759  GLUCAP 153* 139* 149* 157* 179*   Sepsis Labs: Recent Labs  Lab 12/13/2019 2244 12/27/19 0020 12/27/19 0434  PROCALCITON  --   --  >150.00  LATICACIDVEN 4.1* 2.4*  --     Recent Results (from the past 240 hour(s))  SARS CORONAVIRUS 2 (TAT 6-24 HRS) Nasopharyngeal Nasopharyngeal Swab     Status: None   Collection Time: 12/22/19 11:45 PM   Specimen: Nasopharyngeal Swab  Result Value Ref Range Status   SARS Coronavirus 2 NEGATIVE NEGATIVE Final    Comment: (NOTE) SARS-CoV-2 target nucleic acids are NOT DETECTED. The SARS-CoV-2 RNA is generally detectable in upper and lower respiratory specimens during the acute phase of infection. Negative results do not preclude SARS-CoV-2 infection, do not rule out co-infections with other pathogens, and should not be used as the sole basis for treatment or other patient management decisions. Negative results must be combined with clinical observations, patient history, and epidemiological information. The expected result is Negative. Fact Sheet for Patients: SugarRoll.be Fact Sheet for Healthcare Providers: https://www.woods-mathews.com/ This test is not yet approved or cleared by the [place] FDA and  has been authorized for detection and/or diagnosis of SARS-CoV-2 by FDA under an Emergency Use  Authorization (EUA). This EUA will remain  in effect (meaning this test can be used) for the duration of the COVID-19 declaration under Section 56 4(b)(1) of the Act, 21 U.S.C. section 360bbb-3(b)(1), unless the authorization is terminated or revoked sooner. Performed at  [facility], [place] [address], [place], [place] [address]   Blood Culture (routine x 2)     Status: Abnormal   Collection Time: 12/22/2019 10:15 PM   Specimen: BLOOD RIGHT ARM  Result Value Ref Range Status   Specimen Description BLOOD RIGHT ARM  Final   Special Requests   Final    BOTTLES DRAWN AEROBIC AND ANAEROBIC Blood Culture results may not be optimal due to an inadequate volume of blood received in culture bottles   Culture  Setup Time   Final    GRAM POSITIVE COCCI IN CLUSTERS IN BOTH AEROBIC AND ANAEROBIC BOTTLES CRITICAL RESULT CALLED TO, READ BACK BY AND VERIFIED WITH: J. Roselie Skinner, AT 1009 12/27/19 DV Performed at [facility], [place] [address], [place], [place] [address]    Culture METHICILLIN RESISTANT STAPHYLOCOCCUS AUREUS (A)  Final   Report Status 12/29/2019 FINAL  Final   Organism ID, Bacteria METHICILLIN RESISTANT STAPHYLOCOCCUS AUREUS  Final      Susceptibility   Methicillin resistant staphylococcus aureus - MIC*    CIPROFLOXACIN <=0.5 SENSITIVE Sensitive     ERYTHROMYCIN >=8 RESISTANT Resistant     GENTAMICIN <=0.5 SENSITIVE Sensitive     OXACILLIN >=4 RESISTANT Resistant     TETRACYCLINE <=1 SENSITIVE Sensitive     VANCOMYCIN 1 SENSITIVE Sensitive     TRIMETH/SULFA <=10 SENSITIVE Sensitive     CLINDAMYCIN <=0.25 SENSITIVE Sensitive     RIFAMPIN <=0.5 SENSITIVE Sensitive     Inducible Clindamycin NEGATIVE Sensitive     * METHICILLIN RESISTANT STAPHYLOCOCCUS AUREUS  Blood Culture (routine x 2)     Status: Abnormal   Collection Time: 12/19/2019 10:41 PM   Specimen: BLOOD RIGHT HAND  Result Value Ref Range Status   Specimen Description BLOOD RIGHT HAND  Final   Special Requests  AEROBIC BOTTLE ONLY Blood Culture adequate volume  Final   Culture  Setup Time   Final    GRAM POSITIVE COCCI IN CLUSTERS AEROBIC BOTTLE ONLY CRITICAL VALUE NOTED.  VALUE IS CONSISTENT WITH PREVIOUSLY REPORTED AND CALLED VALUE.    Culture (A)  Final    STAPHYLOCOCCUS AUREUS SUSCEPTIBILITIES PERFORMED ON PREVIOUS CULTURE WITHIN THE LAST 5 DAYS. Performed at [facility], [place] [address], [place],  [address]    Report Status 12/29/2019 FINAL  Final  Blood Culture ID Panel (Reflexed)     Status: Abnormal   Collection Time: 01/05/2020 10:41 PM  Result Value Ref Range Status   Enterococcus species NOT DETECTED NOT DETECTED Final   Listeria monocytogenes NOT DETECTED NOT DETECTED Final   Staphylococcus species DETECTED (A) NOT DETECTED Final    Comment: CRITICAL RESULT CALLED TO, READ BACK BY AND VERIFIED WITH: Andres Shad PharmD 14:30 12/27/19 ([email or web address])    Staphylococcus aureus (BCID) DETECTED (A) NOT DETECTED Final    Comment: Methicillin (oxacillin)-resistant Staphylococcus aureus (MRSA). MRSA is predictably resistant to beta-lactam antibiotics (except ceftaroline). Preferred therapy is vancomycin unless clinically contraindicated. Patient requires contact precautions if  hospitalized. CRITICAL RESULT CALLED TO, READ BACK BY AND VERIFIED WITH: Andres Shad PharmD 14:30 12/27/19 ([email or web address])    Methicillin resistance DETECTED (A) NOT DETECTED Final    Comment: CRITICAL RESULT CALLED TO, READ BACK BY AND VERIFIED WITH: Andres Shad PharmD 14:30 12/27/19 ([email or web address])    Streptococcus species NOT DETECTED NOT DETECTED Final   Streptococcus agalactiae NOT DETECTED NOT DETECTED Final   Streptococcus pneumoniae NOT DETECTED NOT DETECTED Final   Streptococcus pyogenes NOT DETECTED NOT DETECTED Final  Acinetobacter baumannii NOT DETECTED NOT DETECTED Final   Enterobacteriaceae species NOT DETECTED NOT DETECTED Final   Enterobacter cloacae complex NOT DETECTED NOT DETECTED Final   Escherichia coli  NOT DETECTED NOT DETECTED Final   Klebsiella oxytoca NOT DETECTED NOT DETECTED Final   Klebsiella pneumoniae NOT DETECTED NOT DETECTED Final   Proteus species NOT DETECTED NOT DETECTED Final   Serratia marcescens NOT DETECTED NOT DETECTED Final   Haemophilus influenzae NOT DETECTED NOT DETECTED Final   Neisseria meningitidis NOT DETECTED NOT DETECTED Final   Pseudomonas aeruginosa NOT DETECTED NOT DETECTED Final   Candida albicans NOT DETECTED NOT DETECTED Final   Candida glabrata NOT DETECTED NOT DETECTED Final   Candida krusei NOT DETECTED NOT DETECTED Final   Candida parapsilosis NOT DETECTED NOT DETECTED Final   Candida tropicalis NOT DETECTED NOT DETECTED Final    Comment: Performed at [facility], [place] [address], [place], [place] [address]  Respiratory Panel by RT PCR (Flu A&B, Covid) - Nasopharyngeal Swab     Status: None   Collection Time: 12/28/2019 11:22 PM   Specimen: Nasopharyngeal Swab  Result Value Ref Range Status   SARS Coronavirus 2 by RT PCR NEGATIVE NEGATIVE Final    Comment: (NOTE) SARS-CoV-2 target nucleic acids are NOT DETECTED. The SARS-CoV-2 RNA is generally detectable in upper respiratoy specimens during the acute phase of infection. The lowest concentration of SARS-CoV-2 viral copies this assay can detect is 131 copies/mL. A negative result does not preclude SARS-Cov-2 infection and should not be used as the sole basis for treatment or other patient management decisions. A negative result may occur with  improper specimen collection/handling, submission of specimen other than nasopharyngeal swab, presence of viral mutation(s) within the areas targeted by this assay, and inadequate number of viral copies (<131 copies/mL). A negative result must be combined with clinical observations, patient history, and epidemiological information. The expected result is Negative. Fact Sheet for Patients:  PinkCheek.be Fact Sheet for  Healthcare Providers:  GravelBags.it This test is not yet ap proved or cleared by the [place] FDA and  has been authorized for detection and/or diagnosis of SARS-CoV-2 by FDA under an Emergency Use Authorization (EUA). This EUA will remain  in effect (meaning this test can be used) for the duration of the COVID-19 declaration under Section 564(b)(1) of the Act, 21 U.S.C. section 360bbb-3(b)(1), unless the authorization is terminated or revoked sooner.    Influenza A by PCR NEGATIVE NEGATIVE Final   Influenza B by PCR NEGATIVE NEGATIVE Final    Comment: (NOTE) The Xpert Xpress SARS-CoV-2/FLU/RSV assay is intended as an aid in  the diagnosis of influenza from Nasopharyngeal swab specimens and  should not be used as a sole basis for treatment. Nasal washings and  aspirates are unacceptable for Xpert Xpress SARS-CoV-2/FLU/RSV  testing. Fact Sheet for Patients: PinkCheek.be Fact Sheet for Healthcare Providers: GravelBags.it This test is not yet approved or cleared by the [place] FDA and  has been authorized for detection and/or diagnosis of SARS-CoV-2 by  FDA under an Emergency Use Authorization (EUA). This EUA will remain  in effect (meaning this test can be used) for the duration of the  Covid-19 declaration under Section 564(b)(1) of the Act, 21  U.S.C. section 360bbb-3(b)(1), unless the authorization is  terminated or revoked. Performed at [facility], [place] [address] [place], [place] [address]   MRSA PCR Screening     Status: Abnormal   Collection Time: 12/27/19  5:30 AM  Specimen: Nasopharyngeal  Result Value Ref Range Status   MRSA by PCR POSITIVE (A) NEGATIVE Final    Comment:        The GeneXpert MRSA Assay (FDA approved for NASAL specimens only), is one component of a comprehensive MRSA colonization surveillance program. It is not intended to diagnose  MRSA infection nor to guide or monitor treatment for MRSA infections. RESULT CALLED TO, READ BACK BY AND VERIFIED WITH: RN Sarajane Jews [phone] FCP Performed at [place] [address], [place], [place] [address]   Culture, blood (routine x 2)     Status: Abnormal (Preliminary result)   Collection Time: 12/28/19  5:40 AM   Specimen: BLOOD RIGHT HAND  Result Value Ref Range Status   Specimen Description BLOOD RIGHT HAND  Final   Special Requests   Final    BOTTLES DRAWN AEROBIC AND ANAEROBIC Blood Culture adequate volume   Culture  Setup Time   Final    IN BOTH AEROBIC AND ANAEROBIC BOTTLES GRAM POSITIVE COCCI IN CLUSTERS CRITICAL VALUE NOTED.  VALUE IS CONSISTENT WITH PREVIOUSLY REPORTED AND CALLED VALUE. Performed at [facility], [place] [address] [place], [place] [address]    Culture STAPHYLOCOCCUS AUREUS (A)  Final   Report Status PENDING  Incomplete  Culture, blood (routine x 2)     Status: Abnormal   Collection Time: 12/28/19  5:59 AM   Specimen: BLOOD RIGHT HAND  Result Value Ref Range Status   Specimen Description BLOOD RIGHT HAND  Final   Special Requests IN PEDIATRIC BOTTLE Blood Culture adequate volume  Final   Culture  Setup Time   Final    GRAM POSITIVE COCCI IN PEDIATRIC BOTTLE CRITICAL RESULT CALLED TO, READ BACK BY AND VERIFIED WITH: PHARMD LINDSAY CHEN _0  12/28/2019 AKT    Culture (A)  Final    STAPHYLOCOCCUS AUREUS SUSCEPTIBILITIES PERFORMED ON PREVIOUS CULTURE WITHIN THE LAST 5 DAYS. Performed at [facility], [place] [address], [place], [place] [address]    Report Status 12/29/2019 FINAL  Final  Culture, blood (routine x 2)     Status: None (Preliminary result)   Collection Time: 12/30/19  6:35 AM   Specimen: BLOOD  Result Value Ref Range Status   Specimen Description BLOOD RIGHT ANTECUBITAL  Final   Special Requests   Final    BOTTLES DRAWN AEROBIC AND ANAEROBIC Blood Culture adequate volume   Culture   Final    NO GROWTH 2  DAYS Performed at [facility], [place] [address] [place], [place] [address]    Report Status PENDING  Incomplete  Culture, blood (routine x 2)     Status: Abnormal (Preliminary result)   Collection Time: 12/30/19  6:40 AM   Specimen: BLOOD RIGHT HAND  Result Value Ref Range Status   Specimen Description BLOOD RIGHT HAND  Final   Special Requests   Final    BOTTLES DRAWN AEROBIC AND ANAEROBIC Blood Culture adequate volume   Culture  Setup Time   Final    GRAM POSITIVE COCCI IN CLUSTERS ANAEROBIC BOTTLE ONLY CRITICAL VALUE NOTED.  VALUE IS CONSISTENT WITH PREVIOUSLY REPORTED AND CALLED VALUE. Performed at [facility], [place] [address], [place], [place] [address]    Culture STAPHYLOCOCCUS AUREUS (A)  Final   Report Status PENDING  Incomplete  Culture, blood (routine x 2)     Status: None (Preliminary result)   Collection Time: 12/31/19 11:09 AM   Specimen: BLOOD RIGHT HAND  Result Value Ref Range Status   Specimen Description BLOOD RIGHT HAND  Final   Special Requests AEROBIC BOTTLE ONLY Blood Culture adequate volume  Final   Culture   Final    NO GROWTH < 24 HOURS Performed at [facility], [address] [place], [place] [address]    Report Status PENDING  Incomplete  Culture, blood (routine x 2)     Status: None (Preliminary result)   Collection Time: 12/31/19 11:23 AM   Specimen: BLOOD RIGHT HAND  Result Value Ref Range Status   Specimen Description BLOOD RIGHT HAND  Final   Special Requests   Final    AEROBIC BOTTLE ONLY Blood Culture results may not be optimal due to an inadequate volume of blood received in culture bottles   Culture  Setup Time   Final    AEROBIC BOTTLE ONLY GRAM POSITIVE COCCI CRITICAL VALUE NOTED.  VALUE IS CONSISTENT WITH PREVIOUSLY REPORTED AND CALLED VALUE.    Culture   Final    NO GROWTH < 24 HOURS Performed at [facility], [place] [address], [place], [place] [address]    Report Status PENDING  Incomplete     Radiology  Studies: No results found.  Scheduled Meds: . Chlorhexidine Gluconate Cloth  6 each Topical Q0600  . ferric citrate  420 mg Oral TID WC  . heparin  5,000 Units Subcutaneous Q8H  . insulin aspart  0-20 Units Subcutaneous TID WC  . lidocaine  15 mL Oral Once  . midodrine  15 mg Oral TID WC  . pantoprazole  40 mg Oral Q0600  . vancomycin variable dose per unstable renal function (pharmacist dosing)   Does not apply See admin instructions   Continuous Infusions: . sodium chloride 250 mL (12/27/19 0436)  . sodium chloride    . sodium chloride    . sodium chloride Stopped (12/31/19 1029)     LOS: 5 days   Time spent: 29 minutes  Little Ishikawa, DO [facility]   To contact the attending provider between 7A-7P or the covering provider during after hours 7P-7A, please log into the web site [email or web address] and access using universal [place] password for that web site. If you do not have the password, please call the hospital operator.  12/16/2019, 8:48 AM

## 2020-01-01 NOTE — Progress Notes (Signed)
[facility]  Date of Admission:  12/22/2019      Total days of antibiotics 7  Vancomycin            ASSESSMENT: Seth [name] is a 54 y.o. male with MRSA bacteremia thought to be secondary to tunneled dialysis line (s/p removal 3/19) but now with persistently positive blood cultures including repeated set drawn on 3/21 and now gram positive cocci growing in 1 set drawn 3/23. He was able to be dialyzed from current AVF without trouble---> hopeful can avoid another catheter.  TEE pending to be done today at 13:30 --> will follow up results.  MRIs of T- and L-spine without vertebral infection.  Knees and wrists during attempt to examine him are tender; they don't appear hot but the right wrist does look more swollen than the left. CT of the left wrist was negative - with ongoing bacteremia and leukocytosis would ask orthopedics to see him formally to determine further work up and assessment for possible septic joints.  CT scan of chest revealed peripheral nodularities bilaterally with the largest demonstrating central cavitation - ? Septic pulmonary emboli. He remains on 4 LPM oxygen which is abnormal for him. Denies any chest pain specifically but just "hurts everywhere." Would consider repeating chest xray to assess for possible empyema.   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 Last treated with 4 weeks IV vancomycin through September 18th 2020.    PLAN: 1. Continue vancomycin  2. Follow up TEE 3/24 @ 1330 3. Follow BCx from 3/23 with GPCs  4. Will have micro run susceptibilities on 3/22 plate 5. Consider repeating Chest XRay 6. Orthopedic eval with complaints of polyarticular joint pain and ongoing bacteremia (R wrist seems worst)   Principal Problem:   MRSA bacteremia Active Problems:   Hyperlipidemia associated with type 2 diabetes  mellitus ([name])   ESRD (end stage renal disease) on dialysis ([name])   Absolute anemia   DM (diabetes mellitus) type 2, uncontrolled, with ketoacidosis ([name])   Hyperkalemia   Hyponatremia   Septic shock ([place])   Sepsis due to infected intravenous catheter ([name])   Wrist pain, acute, left   . Chlorhexidine Gluconate Cloth  6 each Topical Q0600  . ferric citrate  420 mg Oral TID WC  . heparin  5,000 Units Subcutaneous Q8H  . insulin aspart  0-20 Units Subcutaneous TID WC  . lidocaine  15 mL Oral Once  . midodrine  15 mg Oral TID WC  . pantoprazole  40 mg Oral Q0600  . vancomycin variable dose per unstable renal function (pharmacist dosing)   Does not apply See admin instructions    SUBJECTIVE: Angry he cannot have anything to drink with TEE scheduled for this afternoon.  "I gotta have a cup of ice"  Wrists and knees very painful to any touch. Can perform active ROM but limited. Unable to really lift legs off bed long term. Back pain is about the same.   Afebrile, WBC 32K BCx drawn 3/23 growing gram positive cocci in one bottle.  PT/OT recommending SNF after discharge --> during therapy yesterday described pain in all joints with any movement; difficult to determine if mobility limitations are due to pain or weakness.    Review of Systems: Review of Systems  Constitutional: Negative for chills, fever and malaise/fatigue.  Eyes: Negative  for blurred vision.  Respiratory: Negative for cough and shortness of breath.   Cardiovascular: Negative for chest pain.  Gastrointestinal: Negative for abdominal pain, nausea and vomiting.  Musculoskeletal: Positive for back pain and joint pain (b/l wrists and knees). Negative for neck pain.  Neurological: Negative for tingling and headaches.    No Known Allergies  OBJECTIVE: Vitals:   12/31/19 1604 12/31/19 1608 12/31/19 2233 12/16/2019 0802  BP: 99/72 113/62 111/62 105/62  Pulse: 95 90 92 94  Resp: (!) 23 (!) 23 (!) 31 (!) 31  Temp:  (!)  97.2 F (36.2 C) 98.5 F (36.9 C) 98.8 F (37.1 C)  TempSrc:  Oral Oral Oral  SpO2: 100% 100% 96% 99%  Weight:      Height:       Body mass index is 42.81 kg/m.  Physical Exam Vitals and nursing note reviewed.  Constitutional:      Appearance: He is obese.     Comments: Resting in bed.   HENT:     Mouth/Throat:     Mouth: Mucous membranes are dry.     Pharynx: No oropharyngeal exudate.  Eyes:     Pupils: Pupils are equal, round, and reactive to light.  Cardiovascular:     Rate and Rhythm: Normal rate and regular rhythm.     Heart sounds: No murmur.  Pulmonary:     Effort: Pulmonary effort is normal.     Breath sounds: Normal breath sounds.     Comments: Diminished throughout anteriorly  Abdominal:     General: There is no distension.     Tenderness: There is no abdominal tenderness.     Comments: Umbilical hernia present  Musculoskeletal:        General: Tenderness (Wrists, knees. R wrist appears more swollen) present. Normal range of motion.  Skin:    General: Skin is warm and dry.     Capillary Refill: Capillary refill takes less than 2 seconds.     Comments: No wounds. Previous line sites all appear normal and healing well  Neurological:     Mental Status: He is alert and oriented to person, place, and time.  Psychiatric:        Mood and Affect: Mood normal.     Lab Results Lab Results  Component Value Date   WBC 32.5 (H) 12/18/2019   HGB 9.0 (L) 12/18/2019   HCT 26.7 (L) 12/31/2019   MCV 85.3 01/06/2020   PLT 376 12/20/2019    Lab Results  Component Value Date   CREATININE 5.47 (H) 01/05/2020   BUN 56 (H) 01/05/2020   NA 130 (L) 01/08/2020   K 5.3 (H) 12/17/2019   CL 91 (L) 12/22/2019   CO2 22 12/22/2019    Lab Results  Component Value Date   ALT 31 12/24/2019   AST 52 (H) 12/18/2019   ALKPHOS 213 (H) 01/05/2020   BILITOT 1.1 12/15/2019     Microbiology: Recent Results (from the past 240 hour(s))  SARS CORONAVIRUS 2 (TAT 6-24 HRS)  Nasopharyngeal Nasopharyngeal Swab     Status: None   Collection Time: 12/22/19 11:45 PM   Specimen: Nasopharyngeal Swab  Result Value Ref Range Status   SARS Coronavirus 2 NEGATIVE NEGATIVE Final    Comment: (NOTE) SARS-CoV-2 target nucleic acids are NOT DETECTED. The SARS-CoV-2 RNA is generally detectable in upper and lower respiratory specimens during the acute phase of infection. Negative results do not preclude SARS-CoV-2 infection, do not rule out co-infections with other pathogens, and should  not be used as the sole basis for treatment or other patient management decisions. Negative results must be combined with clinical observations, patient history, and epidemiological information. The expected result is Negative. Fact Sheet for Patients: SugarRoll.be Fact Sheet for Healthcare Providers: https://www.woods-mathews.com/ This test is not yet approved or cleared by the [place] FDA and  has been authorized for detection and/or diagnosis of SARS-CoV-2 by FDA under an Emergency Use Authorization (EUA). This EUA will remain  in effect (meaning this test can be used) for the duration of the COVID-19 declaration under Section 56 4(b)(1) of the Act, 21 U.S.C. section 360bbb-3(b)(1), unless the authorization is terminated or revoked sooner. Performed at [facility], [place] [address] [place], [place] [address]   Blood Culture (routine x 2)     Status: Abnormal   Collection Time: 01/06/2020 10:15 PM   Specimen: BLOOD RIGHT ARM  Result Value Ref Range Status   Specimen Description BLOOD RIGHT ARM  Final   Special Requests   Final    BOTTLES DRAWN AEROBIC AND ANAEROBIC Blood Culture results may not be optimal due to an inadequate volume of blood received in culture bottles   Culture  Setup Time   Final    GRAM POSITIVE COCCI IN CLUSTERS IN BOTH AEROBIC AND ANAEROBIC BOTTLES CRITICAL RESULT CALLED TO, READ BACK BY AND VERIFIED WITH: J.  Roselie Skinner, AT 1009 12/27/19 DV Performed at [facility], [place] [address], [place], [place] [address]    Culture METHICILLIN RESISTANT STAPHYLOCOCCUS AUREUS (A)  Final   Report Status 12/29/2019 FINAL  Final   Organism ID, Bacteria METHICILLIN RESISTANT STAPHYLOCOCCUS AUREUS  Final      Susceptibility   Methicillin resistant staphylococcus aureus - MIC*    CIPROFLOXACIN <=0.5 SENSITIVE Sensitive     ERYTHROMYCIN >=8 RESISTANT Resistant     GENTAMICIN <=0.5 SENSITIVE Sensitive     OXACILLIN >=4 RESISTANT Resistant     TETRACYCLINE <=1 SENSITIVE Sensitive     VANCOMYCIN 1 SENSITIVE Sensitive     TRIMETH/SULFA <=10 SENSITIVE Sensitive     CLINDAMYCIN <=0.25 SENSITIVE Sensitive     RIFAMPIN <=0.5 SENSITIVE Sensitive     Inducible Clindamycin NEGATIVE Sensitive     * METHICILLIN RESISTANT STAPHYLOCOCCUS AUREUS  Blood Culture (routine x 2)     Status: Abnormal   Collection Time: 12/25/2019 10:41 PM   Specimen: BLOOD RIGHT HAND  Result Value Ref Range Status   Specimen Description BLOOD RIGHT HAND  Final   Special Requests AEROBIC BOTTLE ONLY Blood Culture adequate volume  Final   Culture  Setup Time   Final    GRAM POSITIVE COCCI IN CLUSTERS AEROBIC BOTTLE ONLY CRITICAL VALUE NOTED.  VALUE IS CONSISTENT WITH PREVIOUSLY REPORTED AND CALLED VALUE.    Culture (A)  Final    STAPHYLOCOCCUS AUREUS SUSCEPTIBILITIES PERFORMED ON PREVIOUS CULTURE WITHIN THE LAST 5 DAYS. Performed at [facility], [place] [address], [place], [place] [address]    Report Status 12/29/2019 FINAL  Final  Blood Culture ID Panel (Reflexed)     Status: Abnormal   Collection Time: 01/08/2020 10:41 PM  Result Value Ref Range Status   Enterococcus species NOT DETECTED NOT DETECTED Final   Listeria monocytogenes NOT DETECTED NOT DETECTED Final   Staphylococcus species DETECTED (A) NOT DETECTED Final    Comment: CRITICAL RESULT CALLED TO, READ BACK BY AND VERIFIED WITH: Andres Shad PharmD 14:30 12/27/19 ([email or web address])      Staphylococcus aureus (BCID) DETECTED (A) NOT  DETECTED Final    Comment: Methicillin (oxacillin)-resistant Staphylococcus aureus (MRSA). MRSA is predictably resistant to beta-lactam antibiotics (except ceftaroline). Preferred therapy is vancomycin unless clinically contraindicated. Patient requires contact precautions if  hospitalized. CRITICAL RESULT CALLED TO, READ BACK BY AND VERIFIED WITH: Andres Shad PharmD 14:30 12/27/19 ([email or web address])    Methicillin resistance DETECTED (A) NOT DETECTED Final    Comment: CRITICAL RESULT CALLED TO, READ BACK BY AND VERIFIED WITH: Andres Shad PharmD 14:30 12/27/19 ([email or web address])    Streptococcus species NOT DETECTED NOT DETECTED Final   Streptococcus agalactiae NOT DETECTED NOT DETECTED Final   Streptococcus pneumoniae NOT DETECTED NOT DETECTED Final   Streptococcus pyogenes NOT DETECTED NOT DETECTED Final   Acinetobacter baumannii NOT DETECTED NOT DETECTED Final   Enterobacteriaceae species NOT DETECTED NOT DETECTED Final   Enterobacter cloacae complex NOT DETECTED NOT DETECTED Final   Escherichia coli NOT DETECTED NOT DETECTED Final   Klebsiella oxytoca NOT DETECTED NOT DETECTED Final   Klebsiella pneumoniae NOT DETECTED NOT DETECTED Final   Proteus species NOT DETECTED NOT DETECTED Final   Serratia marcescens NOT DETECTED NOT DETECTED Final   Haemophilus influenzae NOT DETECTED NOT DETECTED Final   Neisseria meningitidis NOT DETECTED NOT DETECTED Final   Pseudomonas aeruginosa NOT DETECTED NOT DETECTED Final   Candida albicans NOT DETECTED NOT DETECTED Final   Candida glabrata NOT DETECTED NOT DETECTED Final   Candida krusei NOT DETECTED NOT DETECTED Final   Candida parapsilosis NOT DETECTED NOT DETECTED Final   Candida tropicalis NOT DETECTED NOT DETECTED Final    Comment: Performed at [facility], [place]. [address], [place], [place] [address]  Respiratory Panel by RT PCR (Flu A&B, Covid) - Nasopharyngeal Swab     Status: None   Collection Time: 12/15/2019  11:22 PM   Specimen: Nasopharyngeal Swab  Result Value Ref Range Status   SARS Coronavirus 2 by RT PCR NEGATIVE NEGATIVE Final    Comment: (NOTE) SARS-CoV-2 target nucleic acids are NOT DETECTED. The SARS-CoV-2 RNA is generally detectable in upper respiratoy specimens during the acute phase of infection. The lowest concentration of SARS-CoV-2 viral copies this assay can detect is 131 copies/mL. A negative result does not preclude SARS-Cov-2 infection and should not be used as the sole basis for treatment or other patient management decisions. A negative result may occur with  improper specimen collection/handling, submission of specimen other than nasopharyngeal swab, presence of viral mutation(s) within the areas targeted by this assay, and inadequate number of viral copies (<131 copies/mL). A negative result must be combined with clinical observations, patient history, and epidemiological information. The expected result is Negative. Fact Sheet for Patients:  PinkCheek.be Fact Sheet for Healthcare Providers:  GravelBags.it This test is not yet ap proved or cleared by the [place] FDA and  has been authorized for detection and/or diagnosis of SARS-CoV-2 by FDA under an Emergency Use Authorization (EUA). This EUA will remain  in effect (meaning this test can be used) for the duration of the COVID-19 declaration under Section 564(b)(1) of the Act, 21 U.S.C. section 360bbb-3(b)(1), unless the authorization is terminated or revoked sooner.    Influenza A by PCR NEGATIVE NEGATIVE Final   Influenza B by PCR NEGATIVE NEGATIVE Final    Comment: (NOTE) The Xpert Xpress SARS-CoV-2/FLU/RSV assay is intended as an aid in  the diagnosis of influenza from Nasopharyngeal swab specimens and  should not be used as a sole basis for treatment. Nasal washings and  aspirates are unacceptable for Xpert Xpress SARS-CoV-2/FLU/RSV  testing. Fact Sheet for Patients: PinkCheek.be Fact Sheet for Healthcare Providers: GravelBags.it This test is not yet approved or cleared by the [place] FDA and  has been authorized for detection and/or diagnosis of SARS-CoV-2 by  FDA under an Emergency Use Authorization (EUA). This EUA will remain  in effect (meaning this test can be used) for the duration of the  Covid-19 declaration under Section 564(b)(1) of the Act, 21  U.S.C. section 360bbb-3(b)(1), unless the authorization is  terminated or revoked. Performed at [facility], [place] [address] [place], [place] [address]   MRSA PCR Screening     Status: Abnormal   Collection Time: 12/27/19  5:30 AM   Specimen: Nasopharyngeal  Result Value Ref Range Status   MRSA by PCR POSITIVE (A) NEGATIVE Final    Comment:        The GeneXpert MRSA Assay (FDA approved for NASAL specimens only), is one component of a comprehensive MRSA colonization surveillance program. It is not intended to diagnose MRSA infection nor to guide or monitor treatment for MRSA infections. RESULT CALLED TO, READ BACK BY AND VERIFIED WITH: RN Sarajane Jews [id number] FCP Performed at [place] [address], [place], [place] [address]   Culture, blood (routine x 2)     Status: Abnormal (Preliminary result)   Collection Time: 12/28/19  5:40 AM   Specimen: BLOOD RIGHT HAND  Result Value Ref Range Status   Specimen Description BLOOD RIGHT HAND  Final   Special Requests   Final    BOTTLES DRAWN AEROBIC AND ANAEROBIC Blood Culture adequate volume   Culture  Setup Time   Final    IN BOTH AEROBIC AND ANAEROBIC BOTTLES GRAM POSITIVE COCCI IN CLUSTERS CRITICAL VALUE NOTED.  VALUE IS CONSISTENT WITH PREVIOUSLY REPORTED AND CALLED VALUE. Performed at [facility], [place] [address] [place], [place] [address]    Culture STAPHYLOCOCCUS AUREUS (A)  Final   Report Status PENDING   Incomplete  Culture, blood (routine x 2)     Status: Abnormal   Collection Time: 12/28/19  5:59 AM   Specimen: BLOOD RIGHT HAND  Result Value Ref Range Status   Specimen Description BLOOD RIGHT HAND  Final   Special Requests IN PEDIATRIC BOTTLE Blood Culture adequate volume  Final   Culture  Setup Time   Final    GRAM POSITIVE COCCI IN PEDIATRIC BOTTLE CRITICAL RESULT CALLED TO, READ BACK BY AND VERIFIED WITH: PHARMD LINDSAY CHEN @2021  12/28/2019 AKT    Culture (A)  Final    STAPHYLOCOCCUS AUREUS SUSCEPTIBILITIES PERFORMED ON PREVIOUS CULTURE WITHIN THE LAST 5 DAYS. Performed at [facility], [place] [address] [place], [place] [address]    Report Status 12/29/2019 FINAL  Final  Culture, blood (routine x 2)     Status: None (Preliminary result)   Collection Time: 12/30/19  6:35 AM   Specimen: BLOOD  Result Value Ref Range Status   Specimen Description BLOOD RIGHT ANTECUBITAL  Final   Special Requests   Final    BOTTLES DRAWN AEROBIC AND ANAEROBIC Blood Culture adequate volume   Culture   Final    NO GROWTH 2 DAYS Performed at [facility], [place] [address] [place], [place] [address]    Report Status PENDING  Incomplete  Culture, blood (routine x 2)     Status: Abnormal (Preliminary result)   Collection Time: 12/30/19  6:40 AM   Specimen: BLOOD RIGHT HAND  Result Value Ref Range  Status   Specimen Description BLOOD RIGHT HAND  Final   Special Requests   Final    BOTTLES DRAWN AEROBIC AND ANAEROBIC Blood Culture adequate volume   Culture  Setup Time   Final    GRAM POSITIVE COCCI IN CLUSTERS ANAEROBIC BOTTLE ONLY CRITICAL VALUE NOTED.  VALUE IS CONSISTENT WITH PREVIOUSLY REPORTED AND CALLED VALUE. Performed at [facility], [place] [address] [place], [place] [address]    Culture STAPHYLOCOCCUS AUREUS (A)  Final   Report Status PENDING  Incomplete  Culture, blood (routine x 2)     Status: None (Preliminary result)   Collection Time: 12/31/19 11:09 AM   Specimen:  BLOOD RIGHT HAND  Result Value Ref Range Status   Specimen Description BLOOD RIGHT HAND  Final   Special Requests AEROBIC BOTTLE ONLY Blood Culture adequate volume  Final   Culture   Final    NO GROWTH < 24 HOURS Performed at [facility], [place] [address] [place], [place] [address]    Report Status PENDING  Incomplete  Culture, blood (routine x 2)     Status: None (Preliminary result)   Collection Time: 12/31/19 11:23 AM   Specimen: BLOOD RIGHT HAND  Result Value Ref Range Status   Specimen Description BLOOD RIGHT HAND  Final   Special Requests   Final    AEROBIC BOTTLE ONLY Blood Culture results may not be optimal due to an inadequate volume of blood received in culture bottles   Culture  Setup Time   Final    AEROBIC BOTTLE ONLY GRAM POSITIVE COCCI CRITICAL VALUE NOTED.  VALUE IS CONSISTENT WITH PREVIOUSLY REPORTED AND CALLED VALUE.    Culture   Final    NO GROWTH < 24 HOURS Performed at [facility], [place] [name] [place], [place] [address]    Report Status PENDING  Incomplete    Janene Madeira, MSN, NP-C [facility] [email or web address] Pager: [phone] Office: [place]: [phone]

## 2020-01-01 NOTE — Anesthesia Preprocedure Evaluation (Deleted)
Anesthesia Evaluation  Patient identified by MRN, date of birth, ID band Patient awake    Reviewed: Allergy & Precautions, NPO status , Patient's Chart, lab work & pertinent test results  History of Anesthesia Complications Negative for: history of anesthetic complications  Airway        Dental   Pulmonary sleep apnea and Continuous Positive Airway Pressure Ventilation ,           Cardiovascular hypertension, Pt. on medications + Past MI    TTE 12/27/19: EF 17-61%, RV systolic fxn mildly reduced, mild LAE    Neuro/Psych Anxiety negative neurological ROS     GI/Hepatic negative GI ROS, Neg liver ROS,   Endo/Other  diabetes, Type 2, Insulin DependentMorbid obesity  Renal/GU ESRF and DialysisRenal disease  negative genitourinary   Musculoskeletal  (+) Arthritis ,   Abdominal   Peds  Hematology  (+) anemia , Hgb 9.0   Anesthesia Other Findings Day of surgery medications reviewed with patient.  Reproductive/Obstetrics negative OB ROS                            Anesthesia Physical Anesthesia Plan  ASA: III  Anesthesia Plan: MAC   Post-op Pain Management:    Induction:   PONV Risk Score and Plan: Treatment may vary due to age or medical condition and Propofol infusion  Airway Management Planned: Natural Airway and Nasal Cannula  Additional Equipment:   Intra-op Plan:   Post-operative Plan:   Informed Consent:   Plan Discussed with:   Anesthesia Plan Comments: (Unable to obtain IV access in preop despite ultrasound guidance and IV team attempts. Will need to obtain access on floor prior to postponed procedure. Daiva Huge, MD)       Anesthesia Quick Evaluation

## 2020-01-01 NOTE — OR Nursing (Signed)
TEE canceled, IV team and MDA unable to obtain IV access . Dr.Randolf aware. Plan for reschedule procedure  once IV access in place. Bedside RN notified.

## 2020-01-02 DIAGNOSIS — R918 Other nonspecific abnormal finding of lung field: Secondary | ICD-10-CM

## 2020-01-02 DIAGNOSIS — R7881 Bacteremia: Secondary | ICD-10-CM | POA: Diagnosis not present

## 2020-01-02 DIAGNOSIS — M25561 Pain in right knee: Secondary | ICD-10-CM | POA: Diagnosis not present

## 2020-01-02 DIAGNOSIS — B9562 Methicillin resistant Staphylococcus aureus infection as the cause of diseases classified elsewhere: Secondary | ICD-10-CM | POA: Diagnosis not present

## 2020-01-02 LAB — CBC WITH DIFFERENTIAL/PLATELET
Abs Immature Granulocytes: 4.27 10*3/uL — ABNORMAL HIGH (ref 0.00–0.07)
Basophils Absolute: 0.1 10*3/uL (ref 0.0–0.1)
Basophils Relative: 0 %
Eosinophils Absolute: 0.2 10*3/uL (ref 0.0–0.5)
Eosinophils Relative: 1 %
HCT: 27 % — ABNORMAL LOW (ref 39.0–52.0)
Hemoglobin: 8.9 g/dL — ABNORMAL LOW (ref 13.0–17.0)
Immature Granulocytes: 10 %
Lymphocytes Relative: 5 %
Lymphs Abs: 2.3 10*3/uL (ref 0.7–4.0)
MCH: 29 pg (ref 26.0–34.0)
MCHC: 33 g/dL (ref 30.0–36.0)
MCV: 87.9 fL (ref 80.0–100.0)
Monocytes Absolute: 1.6 10*3/uL — ABNORMAL HIGH (ref 0.1–1.0)
Monocytes Relative: 4 %
Neutro Abs: 33.2 10*3/uL — ABNORMAL HIGH (ref 1.7–7.7)
Neutrophils Relative %: 80 %
Platelets: 373 10*3/uL (ref 150–400)
RBC: 3.07 MIL/uL — ABNORMAL LOW (ref 4.22–5.81)
RDW: 17.1 % — ABNORMAL HIGH (ref 11.5–15.5)
WBC: 41.6 10*3/uL — ABNORMAL HIGH (ref 4.0–10.5)
nRBC: 0.8 % — ABNORMAL HIGH (ref 0.0–0.2)

## 2020-01-02 LAB — COMPREHENSIVE METABOLIC PANEL
ALT: 30 U/L (ref 0–44)
AST: 87 U/L — ABNORMAL HIGH (ref 15–41)
Albumin: 1.7 g/dL — ABNORMAL LOW (ref 3.5–5.0)
Alkaline Phosphatase: 256 U/L — ABNORMAL HIGH (ref 38–126)
Anion gap: 16 — ABNORMAL HIGH (ref 5–15)
BUN: 49 mg/dL — ABNORMAL HIGH (ref 6–20)
CO2: 23 mmol/L (ref 22–32)
Calcium: 9.3 mg/dL (ref 8.9–10.3)
Chloride: 95 mmol/L — ABNORMAL LOW (ref 98–111)
Creatinine, Ser: 5.02 mg/dL — ABNORMAL HIGH (ref 0.61–1.24)
GFR calc Af Amer: 14 mL/min — ABNORMAL LOW (ref >60)
GFR calc non Af Amer: 12 mL/min — ABNORMAL LOW (ref >60)
Glucose, Bld: 178 mg/dL — ABNORMAL HIGH (ref 70–99)
Potassium: 4 mmol/L (ref 3.5–5.1)
Sodium: 134 mmol/L — ABNORMAL LOW (ref 135–145)
Total Bilirubin: 1.3 mg/dL — ABNORMAL HIGH (ref 0.3–1.2)
Total Protein: 7.7 g/dL (ref 6.5–8.1)

## 2020-01-02 LAB — RENAL FUNCTION PANEL
Albumin: 1.6 g/dL — ABNORMAL LOW (ref 3.5–5.0)
Anion gap: 22 — ABNORMAL HIGH (ref 5–15)
BUN: 27 mg/dL — ABNORMAL HIGH (ref 6–20)
CO2: 17 mmol/L — ABNORMAL LOW (ref 22–32)
Calcium: 9.2 mg/dL (ref 8.9–10.3)
Chloride: 93 mmol/L — ABNORMAL LOW (ref 98–111)
Creatinine, Ser: 3.36 mg/dL — ABNORMAL HIGH (ref 0.61–1.24)
GFR calc Af Amer: 23 mL/min — ABNORMAL LOW (ref >60)
GFR calc non Af Amer: 19 mL/min — ABNORMAL LOW (ref >60)
Glucose, Bld: 158 mg/dL — ABNORMAL HIGH (ref 70–99)
Phosphorus: 4.7 mg/dL — ABNORMAL HIGH (ref 2.5–4.6)
Potassium: 5.1 mmol/L (ref 3.5–5.1)
Sodium: 132 mmol/L — ABNORMAL LOW (ref 135–145)

## 2020-01-02 LAB — CULTURE, BLOOD (ROUTINE X 2): Special Requests: ADEQUATE

## 2020-01-02 LAB — CBC
HCT: 26 % — ABNORMAL LOW (ref 39.0–52.0)
Hemoglobin: 8.6 g/dL — ABNORMAL LOW (ref 13.0–17.0)
MCH: 29.3 pg (ref 26.0–34.0)
MCHC: 33.1 g/dL (ref 30.0–36.0)
MCV: 88.4 fL (ref 80.0–100.0)
Platelets: 354 10*3/uL (ref 150–400)
RBC: 2.94 MIL/uL — ABNORMAL LOW (ref 4.22–5.81)
RDW: 17.1 % — ABNORMAL HIGH (ref 11.5–15.5)
WBC: 35.6 10*3/uL — ABNORMAL HIGH (ref 4.0–10.5)
nRBC: 1 % — ABNORMAL HIGH (ref 0.0–0.2)

## 2020-01-02 LAB — LACTIC ACID, PLASMA
Lactic Acid, Venous: 1.9 mmol/L (ref 0.5–1.9)
Lactic Acid, Venous: 1.5 mmol/L (ref 0.5–1.9)

## 2020-01-02 LAB — GLUCOSE, CAPILLARY
Glucose-Capillary: 178 mg/dL — ABNORMAL HIGH (ref 70–99)
Glucose-Capillary: 165 mg/dL — ABNORMAL HIGH (ref 70–99)
Glucose-Capillary: 163 mg/dL — ABNORMAL HIGH (ref 70–99)
Glucose-Capillary: 161 mg/dL — ABNORMAL HIGH (ref 70–99)

## 2020-01-02 LAB — AMMONIA: Ammonia: 31 umol/L (ref 9–35)

## 2020-01-02 MED ORDER — VANCOMYCIN HCL IN DEXTROSE 1-5 GM/200ML-% IV SOLN
1000.0000 mg | INTRAVENOUS | Status: DC
  Filled 2020-01-02: qty 200

## 2020-01-02 MED ORDER — ALBUMIN HUMAN 5 % IV SOLN
12.5000 g | Freq: Once | INTRAVENOUS | Status: AC
  Administered 2020-01-02: 12.5 g via INTRAVENOUS
  Filled 2020-01-02: qty 250

## 2020-01-02 NOTE — Clinical Social Work Note (Signed)
CSW attempted to speak with patient about discharge plan. Patient sound asleep. CSW will try again. PT recommending SNF, however per their notes recommendations may change once patient's acute needs are stabilized.

## 2020-01-02 NOTE — Progress Notes (Signed)
  [facility] Progress Note   Assessment/ Plan:    1. Septic shock 2/2 MRSA bacteremia: (3rd episode in past year) on vancomycin, off pressors now.  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  TDC has been removed (think that's the source).  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  Repeat blood cultures + 12/28/2018, 3/22 +, 3/23 pending.  Will need endocarditis eval,  Cardiology planned TEE mid week but no IV so cancelled.  MRI back no e/o infection (suboptimal due to motion).  ID following - appreciate.  2.  ESRD: MWF [facility]-- missed 2 sessions, HD yesterday 3.20, next planned for Monday.  Had used AVF 6x as OP, used 2 16 g needles successfully 3/21.  Failed cannulation 15g 3/22 but used 16g 3/23. 3/24 15g used successfully. Next HD 3/26, will use 15g UF 2-3L as tolerated (typically requires higher UF but poor po intake here).  Uses midodrine to augment BP, increased to 15 mg TID.     3.  DM II: per primary  4.  Hyponatremia: improvedwith HD. On fluid restriction and serum glucose ok. CTM.  5.  Anemia: hgb 8.9, no IV iron in setting of bacteremia.  Added aranesp 60 weekly 3/24.  6. BMMD: Phos up to 6.1, added back Auryxia  7.  Malnutrition:albumin 1.7, prostat BID  7.  Dispo: admitted  Subjective:    Hd completed at midnight.  Pt c/o generalized joint pains today but no dyspnea.     Objective:   BP (!) 106/43 (BP Location: Right Wrist)   Pulse 98   Temp 99.8 F (37.7 C) (Axillary)   Resp (!) 35   Ht 5\' 8"  (1.727 m)   Wt 124.3 kg   SpO2 95%   BMI 41.67 kg/m   Physical Exam: GEN NAD, lethargic HEENT EOMI PERRL NECK no JVD PULM normal WOB, distant BS CV RRR ABD obese EXT no ankle edema NEURO lethargic, mumbles answers to questions but can answer some questions appropriately SKIN no rashes ACCESS: LUE AVF +T/B, resolving infiltration - sizeable lateral hematoma  Labs: BMET Recent Labs  Lab 12/27/19 2253 12/28/19 0818 12/29/19 0751  12/30/19 0644 12/31/19 0428 12/14/2019 0245 01/02/20 0054  NA 123* 125* 129* 127* 126* 130* 132*  K 5.5* 6.3* 4.7 4.9 5.0 5.3* 5.1  CL 87* 87* 90* 88* 87* 91* 93*  CO2 17* 19* 20* 18* 22 22 17*  GLUCOSE 120* 114* 135* 147* 144* 170* 158*  BUN 74* 81* 53* 79* 102* 56* 27*  CREATININE 10.50* 10.97* 6.56* 7.47* 8.57* 5.47* 3.36*  CALCIUM 7.6* 7.6* 8.7* 8.6* 8.8* 8.8* 9.2  PHOS 6.1* 7.0* 6.1* 6.3* 6.4* 5.7* 4.7*   CBC Recent Labs  Lab 12/30/19 0644 12/31/19 0428 12/11/2019 0245 01/02/20 0054  WBC 24.9* 29.1* 32.5* 41.6*  NEUTROABS 21.4* 25.0* 25.1* 33.2*  HGB 9.5* 9.2* 9.0* 8.9*  HCT 27.7* 26.9* 26.7* 27.0*  MCV 84.7 84.9 85.3 87.9  PLT 329 342 376 373      Medications:    . Chlorhexidine Gluconate Cloth  6 each Topical Q0600  . ferric citrate  420 mg Oral TID WC  . heparin  5,000 Units Subcutaneous Q8H  . insulin aspart  0-20 Units Subcutaneous TID WC  . lidocaine  15 mL Oral Once  . midodrine  15 mg Oral TID WC  . pantoprazole  40 mg Oral Q0600

## 2020-01-02 NOTE — Progress Notes (Signed)
Pt completed HD' tolerated HD well; vital signs stable; A&O to person, place and situation. Mumbles when he speaks sometimes unable to understand, states he is ok; arm pain=8. CBG=217.

## 2020-01-02 NOTE — Consult Note (Signed)
   [facility] Inpatient Consult   01/02/2020  Seth [name] 1965/12/30 [id number]   Patient was screened for  Extreme high risk for unplanned readmission scores in the [place] and to assess for post hospital Care Management services needed. Patient will have the transition of care call conducted by the primary care provider.   Plan: Notification sent to the [facility] Embedded Care Management and made aware of above needs.   Please contact for further questions,  Natividad Brood, RN BSN [facility] Liaison  [phone] business mobile phone Toll free office [phone]  [email or web address] number: [phone] [email or web address]

## 2020-01-02 NOTE — Progress Notes (Signed)
PROGRESS NOTE    Seth [name]  HLK:[id number] DOB: 10-26-1965 DOA: 01/04/2020 PCP: Mitzi Hansen, MD    Brief Narrative: 54 year old male with PMH as below, which is significant for ESRD on HD, chronic hypoxemic respiratory failure no home O2, OSA on CPAP, DM, discitis, and cord compression myelopathy. Most recent HD was 3/17. He left the session 45 mins early due to back pain. He was in his usual state of health until approximately 3/11 when he developed muscle aches and back pain. He had just received his 1st COVID-19 vaccine a few days prior and it was felt these symptoms were secondary to this. He has been seen twice in the ED in the past week. First for complaints of febrile illness. He was not given any antibiotics at that time. He then returned 3/18 with complaints of myalgias. He returned to ED later in the day 3/18 via EMS after being found to have altered mental status at home. Upon arrival to ED he was noted to be hypotensive with SBP in the 60s and hypoxemic with O2 sats in the 70s on room air. Improved with supplemental O2 and levophed infusion after 273m bolus. There were also concerns he had been unable to use his legs the past few days and MRI spine was ordered. PCCM asked to evaluate for admission due to need for vasoactive infusion.  Patient initially admitted to ICU for pressor management given septic shock, markedly improved, likely infected hemodialysis catheter removed on 12/27/2019, recurrent blood cultures remain positive. ID following.  Assessment & Plan:   Principal Problem:   MRSA bacteremia Active Problems:   Hyperlipidemia associated with type 2 diabetes mellitus ([place]   ESRD (end stage renal disease) on dialysis ([name])   Absolute anemia   DM (diabetes mellitus) type 2, uncontrolled, with ketoacidosis ([name])   Hyperkalemia   Hyponatremia   Septic shock ([name])   Sepsis due to infected intravenous catheter ([name])   Wrist pain, acute, left   Septic shock  secondary to MRSA bacteremia secondary to infected HD catheter/ Patient had presented noted to be in septic shock requiring pressors felt secondary to infected HD catheter.  Infected HD catheter removed 12/27/2019 with purulence noted.  Patient remains off pressors. Blood cultures positive for MRSA.  2D echo negative for any vegetations endocarditis.  Repeat blood cultures from 12/28/2019 + for staph aureus. 2 of 5 blood cultures 3/22 remain positive.  TEE pending - was unable to be performed 3/24 due to IV access issues.. Midodrine dose increased to 15 mg 3 times daily per nephrology.  Continue current regimen of IV vancomycin per ID -TEE on schedule 01/06/2020 - IV issues resolved pt now has IV - NPO at midnight. -01/02/2020 rapid response called patient's blood pressure soft, MAP low 60s to high 50s, mildly tachycardic, mental status remains altered but no change from past 24 hours. Likely in the setting of poor p.o. intake over the past few days. If blood pressure remains low we will bolus to 50 cc fluid challenge to maintain MAP greater than 65, on reevaluation map at this time high 60s, will hold off on IV fluids at this time given end-stage renal disease as below.  Hyperkalemia/hyponatremia/acidosis/hyperphosphatemia Secondary to end-stage renal disease.  Status post Lokelma.  Potassium at 5.1 this morning.  Sodium levels fluctuating currently at 132.  Phosphorus at 6.4.  Patient with end-stage renal disease unable to undergo hemodialysis 3/22 due to failed cannulation. Ongoing dialysis schedule and dialysis management per nephrology.  Lower extremity weakness, concurrent ambulatory dysfunction Patient still with lower extremity weakness with slight improvement.  Initially unable to obtain MRI due to claustrophobia however was given some Ativan and MRI obtained 12/29/2019.  MRI of the L-spine obtained with no acute abnormality or evidence for infection within the lumbar spine, technically limited exam due  to motion artifact.  10 mm anterior listhesis of L5 on S1 with associated moderate bilateral subarticular stenosis, with moderate to severe bilateral L5 foraminal narrowing.  Right foraminal to extraforaminal disc protrusion at L3-4 potentially affecting the exiting right L3 nerve root.  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  PT/OT evaluation ongoing -possible need for placement pending further evaluation and clinical course.   Diabetes mellitus II, uncontrolled Hemoglobin A1c of 7.1.  CBG of 153 this morning.  Continue sliding scale insulin.  End-stage renal disease MWF schedule Uremia resolved Patient being followed by nephrology as above  OSA CPAP as tolerated  Hypertension, essential - currently borderline hypotensive Midodrine 15 mg 3 times daily per nephrology.  Continue to hold antihypertensives as indicated   DVT prophylaxis: Heparin Code Status: Full Family Communication: Updated patient.   Disposition Plan:  . Patient admitted from: Home        . Anticipated d/c place: Undetermined - SNF vs Home with home health pending clinical course  . Barriers to d/c OR conditions which need to be met to effect a safe d/c: Likely home with home health versus SNF pending PT evaluation, once clinically improved, improvement with hypotension/sepsis, electrolyte abnormalities corrected, improving leukocytosis, pending TEE, cleared by ID and nephrology.   Consultants:   Patient admitted to critical care service  ID: Dr.Comer 12/27/2019  Nephrology: Dr. Hollie Salk 12/27/2019  Procedures:   CT left wrist 12/27/2019  CT angiogram chest 12/13/2019  Plain films of the left wrist 12/27/2019  Chest x-ray 12/10/2019  Incomplete MRI T and L-spine 12/27/2019--- patient could not tolerate due to claustrophobia.  2D echo 12/27/2019  Successful removal of tunneled left IJ hemodialysis catheter with purulence expressed from tract  per Ascencion Dike, PA interventional radiology 12/27/2019  Antimicrobials:  IV Zosyn 12/27/2019 x1  IV vancomycin 12/28/2019   Subjective: No acute issues or events overnight, patient somewhat confused this morning slightly more than previous but still alert and oriented to person and place. Later this afternoon patient had notable rapid response with hypotension, tachycardia but maintained maps in the low 60s, resolved on his own without intervention or need for IV fluids or pressors. Patient's vital signs otherwise remained without hypoxia or fever.  Objective: Vitals:   01/02/20 0023 01/02/20 0323 01/02/20 0324 01/02/20 0444  BP: 117/81 (!) 81/50 (!) 106/43   Pulse: 98 (!) 101 98   Resp: (!) 39 (!) 26 (!) 35   Temp: 98.8 F (37.1 C) 99.2 F (37.3 C)    TempSrc: Oral Oral    SpO2: 96% 96% 95%   Weight:    124.3 kg  Height:        Intake/Output Summary (Last 24 hours) at 01/02/2020 0727 Last data filed at 12/10/2019 2317 Gross per 24 hour  Intake 200 ml  Output 2500 ml  Net -2300 ml   Filed Weights   12/09/2019 1845 12/10/2019 2317 01/02/20 0444  Weight: 130.6 kg 129 kg 124.3 kg    Examination:  General exam: NAD. Resting comfortably, somnolent but easily arousable. Respiratory system: CTAB anterior lung fields with no wheezes, no crackles, no rhonchi.  Normal respiratory effort.  Cardiovascular system: Regular rate rhythm no murmurs rubs or gallops.  No JVD.  No lower extremity edema.  Gastrointestinal system: Abdomen is soft, nontender, nondistended, positive bowel sounds.  No rebound.  No guarding.   Central nervous system: Somewhat drowsy however following commands moving extremities.   Extremities: 3-4/5 bilateral lower extremity strength.  Neuro: Without focal neuro deficits, patient awake alert oriented to person place and general situation only, cannot recall month or year  Data Reviewed: I have personally reviewed following labs and imaging studies  CBC: Recent  Labs  Lab 12/29/19 0751 12/30/19 0644 12/31/19 0428 01/08/2020 0245 01/02/20 0054  WBC 19.9* 24.9* 29.1* 32.5* 41.6*  NEUTROABS 16.5* 21.4* 25.0* 25.1* 33.2*  HGB 10.5* 9.5* 9.2* 9.0* 8.9*  HCT 31.1* 27.7* 26.9* 26.7* 27.0*  MCV 85.9 84.7 84.9 85.3 87.9  PLT 289 329 342 376 419   Basic Metabolic Panel: Recent Labs  Lab 12/27/19 0434 12/27/19 2253 12/28/19 0818 12/28/19 0818 12/29/19 0751 12/30/19 0644 12/31/19 0428 12/10/2019 0245 01/02/20 0054  NA 128*   < > 125*   < > 129* 127* 126* 130* 132*  K 5.3*   < > 6.3*   < > 4.7 4.9 5.0 5.3* 5.1  CL 89*   < > 87*   < > 90* 88* 87* 91* 93*  CO2 17*   < > 19*   < > 20* 18* 22 22 17*  GLUCOSE 115*   < > 114*   < > 135* 147* 144* 170* 158*  BUN 62*   < > 81*   < > 53* 79* 102* 56* 27*  CREATININE 9.93*   < > 10.97*   < > 6.56* 7.47* 8.57* 5.47* 3.36*  CALCIUM 8.2*   < > 7.6*   < > 8.7* 8.6* 8.8* 8.8* 9.2  MG 1.6*  --  2.0  --  1.9  --   --   --   --   PHOS 6.0*   < > 7.0*   < > 6.1* 6.3* 6.4* 5.7* 4.7*   < > = values in this interval not displayed.   GFR: Estimated Creatinine Clearance: 31.9 mL/min (A) (by C-G formula based on SCr of 3.36 mg/dL (H)). Liver Function Tests: Recent Labs  Lab 01/08/2020 2220 12/27/19 2253 12/29/19 0751 12/30/19 0644 12/31/19 0428 12/19/2019 0245 01/02/20 0054  AST 52*  --   --   --   --   --   --   ALT 31  --   --   --   --   --   --   ALKPHOS 213*  --   --   --   --   --   --   BILITOT 1.1  --   --   --   --   --   --   PROT 7.4  --   --   --   --   --   --   ALBUMIN 2.2*   < > 1.7* 1.6* 1.6* 1.7* 1.6*   < > = values in this interval not displayed.   No results for input(s): LIPASE, AMYLASE in the last 168 hours. No results for input(s): AMMONIA in the last 168 hours. Coagulation Profile: Recent Labs  Lab 12/27/19 0010  INR 1.4*   Recent Labs  Lab 12/29/2019 0214 12/31/2019 0759 12/18/2019 1142 01/04/2020 1726 01/04/2020 2343  GLUCAP 157* 179* 169* 191* 217*   Sepsis Labs: Recent Labs    Lab 12/28/2019 2244 12/27/19  0020 12/27/19 0434  PROCALCITON  --   --  >150.00  LATICACIDVEN 4.1* 2.4*  --     Recent Results (from the past 240 hour(s))  Blood Culture (routine x 2)     Status: Abnormal   Collection Time: 01/02/2020 10:15 PM   Specimen: BLOOD RIGHT ARM  Result Value Ref Range Status   Specimen Description BLOOD RIGHT ARM  Final   Special Requests   Final    BOTTLES DRAWN AEROBIC AND ANAEROBIC Blood Culture results may not be optimal due to an inadequate volume of blood received in culture bottles   Culture  Setup Time   Final    GRAM POSITIVE COCCI IN CLUSTERS IN BOTH AEROBIC AND ANAEROBIC BOTTLES CRITICAL RESULT CALLED TO, READ BACK BY AND VERIFIED WITH: J. Roselie Skinner, AT 1009 12/27/19 DV Performed at [facility], [place] [address] [place], [place] [address]    Culture METHICILLIN RESISTANT STAPHYLOCOCCUS AUREUS (A)  Final   Report Status 12/29/2019 FINAL  Final   Organism ID, Bacteria METHICILLIN RESISTANT STAPHYLOCOCCUS AUREUS  Final      Susceptibility   Methicillin resistant staphylococcus aureus - MIC*    CIPROFLOXACIN <=0.5 SENSITIVE Sensitive     ERYTHROMYCIN >=8 RESISTANT Resistant     GENTAMICIN <=0.5 SENSITIVE Sensitive     OXACILLIN >=4 RESISTANT Resistant     TETRACYCLINE <=1 SENSITIVE Sensitive     VANCOMYCIN 1 SENSITIVE Sensitive     TRIMETH/SULFA <=10 SENSITIVE Sensitive     CLINDAMYCIN <=0.25 SENSITIVE Sensitive     RIFAMPIN <=0.5 SENSITIVE Sensitive     Inducible Clindamycin NEGATIVE Sensitive     * METHICILLIN RESISTANT STAPHYLOCOCCUS AUREUS  Blood Culture (routine x 2)     Status: Abnormal   Collection Time: 12/09/2019 10:41 PM   Specimen: BLOOD RIGHT HAND  Result Value Ref Range Status   Specimen Description BLOOD RIGHT HAND  Final   Special Requests AEROBIC BOTTLE ONLY Blood Culture adequate volume  Final   Culture  Setup Time   Final    GRAM POSITIVE COCCI IN CLUSTERS AEROBIC BOTTLE ONLY CRITICAL VALUE NOTED.  VALUE IS  CONSISTENT WITH PREVIOUSLY REPORTED AND CALLED VALUE.    Culture (A)  Final    STAPHYLOCOCCUS AUREUS SUSCEPTIBILITIES PERFORMED ON PREVIOUS CULTURE WITHIN THE LAST 5 DAYS. Performed at [facility], [place] [address], [place], [place] [address]    Report Status 12/29/2019 FINAL  Final  Blood Culture ID Panel (Reflexed)     Status: Abnormal   Collection Time: 12/25/2019 10:41 PM  Result Value Ref Range Status   Enterococcus species NOT DETECTED NOT DETECTED Final   Listeria monocytogenes NOT DETECTED NOT DETECTED Final   Staphylococcus species DETECTED (A) NOT DETECTED Final    Comment: CRITICAL RESULT CALLED TO, READ BACK BY AND VERIFIED WITH: Andres Shad PharmD 14:30 12/27/19 ([email or web address])    Staphylococcus aureus (BCID) DETECTED (A) NOT DETECTED Final    Comment: Methicillin (oxacillin)-resistant Staphylococcus aureus (MRSA). MRSA is predictably resistant to beta-lactam antibiotics (except ceftaroline). Preferred therapy is vancomycin unless clinically contraindicated. Patient requires contact precautions if  hospitalized. CRITICAL RESULT CALLED TO, READ BACK BY AND VERIFIED WITH: Andres Shad PharmD 14:30 12/27/19 ([email or web address])    Methicillin resistance DETECTED (A) NOT DETECTED Final    Comment: CRITICAL RESULT CALLED TO, READ BACK BY AND VERIFIED WITH: Andres Shad PharmD 14:30 12/27/19 ([email or web address])    Streptococcus species NOT DETECTED NOT DETECTED Final   Streptococcus agalactiae NOT DETECTED NOT DETECTED Final  Streptococcus pneumoniae NOT DETECTED NOT DETECTED Final   Streptococcus pyogenes NOT DETECTED NOT DETECTED Final   Acinetobacter baumannii NOT DETECTED NOT DETECTED Final   Enterobacteriaceae species NOT DETECTED NOT DETECTED Final   Enterobacter cloacae complex NOT DETECTED NOT DETECTED Final   Escherichia coli NOT DETECTED NOT DETECTED Final   Klebsiella oxytoca NOT DETECTED NOT DETECTED Final   Klebsiella pneumoniae NOT DETECTED NOT DETECTED Final   Proteus species NOT DETECTED NOT  DETECTED Final   Serratia marcescens NOT DETECTED NOT DETECTED Final   Haemophilus influenzae NOT DETECTED NOT DETECTED Final   Neisseria meningitidis NOT DETECTED NOT DETECTED Final   Pseudomonas aeruginosa NOT DETECTED NOT DETECTED Final   Candida albicans NOT DETECTED NOT DETECTED Final   Candida glabrata NOT DETECTED NOT DETECTED Final   Candida krusei NOT DETECTED NOT DETECTED Final   Candida parapsilosis NOT DETECTED NOT DETECTED Final   Candida tropicalis NOT DETECTED NOT DETECTED Final    Comment: Performed at [facility], [place] [address] [place], [place] [address]  Respiratory Panel by RT PCR (Flu A&B, Covid) - Nasopharyngeal Swab     Status: None   Collection Time: 01/08/2020 11:22 PM   Specimen: Nasopharyngeal Swab  Result Value Ref Range Status   SARS Coronavirus 2 by RT PCR NEGATIVE NEGATIVE Final    Comment: (NOTE) SARS-CoV-2 target nucleic acids are NOT DETECTED. The SARS-CoV-2 RNA is generally detectable in upper respiratoy specimens during the acute phase of infection. The lowest concentration of SARS-CoV-2 viral copies this assay can detect is 131 copies/mL. A negative result does not preclude SARS-Cov-2 infection and should not be used as the sole basis for treatment or other patient management decisions. A negative result may occur with  improper specimen collection/handling, submission of specimen other than nasopharyngeal swab, presence of viral mutation(s) within the areas targeted by this assay, and inadequate number of viral copies (<131 copies/mL). A negative result must be combined with clinical observations, patient history, and epidemiological information. The expected result is Negative. Fact Sheet for Patients:  PinkCheek.be Fact Sheet for Healthcare Providers:  GravelBags.it This test is not yet ap proved or cleared by the [place] FDA and  has been authorized for detection and/or  diagnosis of SARS-CoV-2 by FDA under an Emergency Use Authorization (EUA). This EUA will remain  in effect (meaning this test can be used) for the duration of the COVID-19 declaration under Section 564(b)(1) of the Act, 21 U.S.C. section 360bbb-3(b)(1), unless the authorization is terminated or revoked sooner.    Influenza A by PCR NEGATIVE NEGATIVE Final   Influenza B by PCR NEGATIVE NEGATIVE Final    Comment: (NOTE) The Xpert Xpress SARS-CoV-2/FLU/RSV assay is intended as an aid in  the diagnosis of influenza from Nasopharyngeal swab specimens and  should not be used as a sole basis for treatment. Nasal washings and  aspirates are unacceptable for Xpert Xpress SARS-CoV-2/FLU/RSV  testing. Fact Sheet for Patients: PinkCheek.be Fact Sheet for Healthcare Providers: GravelBags.it This test is not yet approved or cleared by the [place] FDA and  has been authorized for detection and/or diagnosis of SARS-CoV-2 by  FDA under an Emergency Use Authorization (EUA). This EUA will remain  in effect (meaning this test can be used) for the duration of the  Covid-19 declaration under Section 564(b)(1) of the Act, 21  U.S.C. section 360bbb-3(b)(1), unless the authorization is  terminated or revoked. Performed at [facility], [place] [address] [place], [place] [address]  MRSA PCR Screening     Status: Abnormal   Collection Time: 12/27/19  5:30 AM   Specimen: Nasopharyngeal  Result Value Ref Range Status   MRSA by PCR POSITIVE (A) NEGATIVE Final    Comment:        The GeneXpert MRSA Assay (FDA approved for NASAL specimens only), is one component of a comprehensive MRSA colonization surveillance program. It is not intended to diagnose MRSA infection nor to guide or monitor treatment for MRSA infections. RESULT CALLED TO, READ BACK BY AND VERIFIED WITH: RN Sarajane Jews [id number] FCP Performed at [place] [address] [place], [place] [address]   Culture, blood (routine x 2)     Status: Abnormal   Collection Time: 12/28/19  5:40 AM   Specimen: BLOOD RIGHT HAND  Result Value Ref Range Status   Specimen Description BLOOD RIGHT HAND  Final   Special Requests   Final    BOTTLES DRAWN AEROBIC AND ANAEROBIC Blood Culture adequate volume   Culture  Setup Time   Final    IN BOTH AEROBIC AND ANAEROBIC BOTTLES GRAM POSITIVE COCCI IN CLUSTERS CRITICAL VALUE NOTED.  VALUE IS CONSISTENT WITH PREVIOUSLY REPORTED AND CALLED VALUE.    Culture (A)  Final    STAPHYLOCOCCUS AUREUS SUSCEPTIBILITIES PERFORMED ON PREVIOUS CULTURE WITHIN THE LAST 5 DAYS. Performed at [facility], [place] [address], [place], [place] [address]    Report Status 01/02/2020 FINAL  Final  Culture, blood (routine x 2)     Status: Abnormal   Collection Time: 12/28/19  5:59 AM   Specimen: BLOOD RIGHT HAND  Result Value Ref Range Status   Specimen Description BLOOD RIGHT HAND  Final   Special Requests IN PEDIATRIC BOTTLE Blood Culture adequate volume  Final   Culture  Setup Time   Final    GRAM POSITIVE COCCI IN PEDIATRIC BOTTLE CRITICAL RESULT CALLED TO, READ BACK BY AND VERIFIED WITH: PHARMD LINDSAY CHEN _0  12/28/2019 AKT    Culture (A)  Final    STAPHYLOCOCCUS AUREUS SUSCEPTIBILITIES PERFORMED ON PREVIOUS CULTURE WITHIN THE LAST 5 DAYS. Performed at [facility], [place] [address], [place], [place] [address]    Report Status 12/29/2019 FINAL  Final  Culture, blood (routine x 2)     Status: None (Preliminary result)   Collection Time: 12/30/19  6:35 AM   Specimen: BLOOD  Result Value Ref Range Status   Specimen Description BLOOD RIGHT ANTECUBITAL  Final   Special Requests   Final    BOTTLES DRAWN AEROBIC AND ANAEROBIC Blood Culture adequate volume   Culture   Final    NO GROWTH 2 DAYS Performed at [facility], [place] [address] [place], [place] [address]    Report Status PENDING  Incomplete  Culture,  blood (routine x 2)     Status: Abnormal   Collection Time: 12/30/19  6:40 AM   Specimen: BLOOD RIGHT HAND  Result Value Ref Range Status   Specimen Description BLOOD RIGHT HAND  Final   Special Requests   Final    BOTTLES DRAWN AEROBIC AND ANAEROBIC Blood Culture adequate volume   Culture  Setup Time   Final    GRAM POSITIVE COCCI IN CLUSTERS ANAEROBIC BOTTLE ONLY CRITICAL VALUE NOTED.  VALUE IS CONSISTENT WITH PREVIOUSLY REPORTED AND CALLED VALUE. Performed at [facility], [place] [address], [place], [place] [address]    Culture METHICILLIN RESISTANT STAPHYLOCOCCUS AUREUS (A)  Final  Report Status 01/02/2020 FINAL  Final   Organism ID, Bacteria METHICILLIN RESISTANT STAPHYLOCOCCUS AUREUS  Final      Susceptibility   Methicillin resistant staphylococcus aureus - MIC*    CIPROFLOXACIN <=0.5 SENSITIVE Sensitive     ERYTHROMYCIN >=8 RESISTANT Resistant     GENTAMICIN <=0.5 SENSITIVE Sensitive     OXACILLIN >=4 RESISTANT Resistant     TETRACYCLINE <=1 SENSITIVE Sensitive     VANCOMYCIN 1 SENSITIVE Sensitive     TRIMETH/SULFA <=10 SENSITIVE Sensitive     CLINDAMYCIN <=0.25 SENSITIVE Sensitive     RIFAMPIN <=0.5 SENSITIVE Sensitive     Inducible Clindamycin NEGATIVE Sensitive     * METHICILLIN RESISTANT STAPHYLOCOCCUS AUREUS  Culture, blood (routine x 2)     Status: None (Preliminary result)   Collection Time: 12/31/19 11:09 AM   Specimen: BLOOD RIGHT HAND  Result Value Ref Range Status   Specimen Description BLOOD RIGHT HAND  Final   Special Requests AEROBIC BOTTLE ONLY Blood Culture adequate volume  Final   Culture   Final    NO GROWTH < 24 HOURS Performed at [facility], [place] [address], [place], [place] [address]    Report Status PENDING  Incomplete  Culture, blood (routine x 2)     Status: Abnormal (Preliminary result)   Collection Time: 12/31/19 11:23 AM   Specimen: BLOOD RIGHT HAND  Result Value Ref Range Status   Specimen Description BLOOD RIGHT HAND  Final     Special Requests   Final    AEROBIC BOTTLE ONLY Blood Culture results may not be optimal due to an inadequate volume of blood received in culture bottles   Culture  Setup Time   Final    AEROBIC BOTTLE ONLY GRAM POSITIVE COCCI CRITICAL VALUE NOTED.  VALUE IS CONSISTENT WITH PREVIOUSLY REPORTED AND CALLED VALUE.    Culture (A)  Final    STAPHYLOCOCCUS AUREUS SUSCEPTIBILITIES PERFORMED ON PREVIOUS CULTURE WITHIN THE LAST 5 DAYS. Performed at [facility], [place] [address], [place], [place] [address]    Report Status PENDING  Incomplete     Radiology Studies: VAS US DUPLEX DIALYSIS ACCESS (AVF, AVG)  Result Date: 12/27/2019 DIALYSIS ACCESS Reason for Exam: Unable to dialyze through AVF/AVG. Access Site: Left Upper Extremity. Access Type: Brachial-basilic AVF. Comparison Study: 06/04/2019 Performing Technologist: Maudry Mayhew MHA, RDMS, RVT, RDCS  Examination Guidelines: A complete evaluation includes B-mode imaging, spectral Doppler, color Doppler, and power Doppler as needed of all accessible portions of each vessel. Unilateral testing is considered an integral part of a complete examination. Limited examinations for reoccurring indications may be performed as noted.  Findings: +--------------------+----------+-----------------+--------+ AVF                 PSV (cm/s)Flow Vol (mL/min)Comments +--------------------+----------+-----------------+--------+ Native artery inflow   269           361                +--------------------+----------+-----------------+--------+ AVF Anastomosis        259                              +--------------------+----------+-----------------+--------+  +------------+----------+-------------+----------+----------------+ OUTFLOW VEINPSV (cm/s)Diameter (cm)Depth (cm)    Describe     +------------+----------+-------------+----------+----------------+ Prox UA        453        0.83        1.00                     +------------+----------+-------------+----------+----------------+  Mid UA          59        0.69        0.53   competing branch +------------+----------+-------------+----------+----------------+ Dist UA        346        0.70        0.56                    +------------+----------+-------------+----------+----------------+   Summary: Arteriovenous fistula-Velocities less than 100cm/s noted in outflow vein at level of competing branch. *See table(s) above for measurements and observations.  Diagnosing physician: Ruta Hinds MD Electronically signed by Ruta Hinds MD on 12/25/2019 at 4:05:16 PM.   --------------------------------------------------------------------------------   Final     Scheduled Meds: . Chlorhexidine Gluconate Cloth  6 each Topical Q0600  . ferric citrate  420 mg Oral TID WC  . heparin  5,000 Units Subcutaneous Q8H  . insulin aspart  0-20 Units Subcutaneous TID WC  . lidocaine  15 mL Oral Once  . midodrine  15 mg Oral TID WC  . pantoprazole  40 mg Oral Q0600  . vancomycin variable dose per unstable renal function (pharmacist dosing)   Does not apply See admin instructions   Continuous Infusions: . sodium chloride 250 mL (12/27/19 0436)  . sodium chloride 100 mL (01/02/20 0535)  . sodium chloride    . albumin human       LOS: 6 days   Time spent: 40 minutes  Little Ishikawa, DO [facility]   To contact the attending provider between 7A-7P or the covering provider during after hours 7P-7A, please log into the web site [email or web address] and access using universal [place] password for that web site. If you do not have the password, please call the hospital operator.  01/02/2020, 7:27 AM

## 2020-01-02 NOTE — Progress Notes (Signed)
[name], male   DOB: 06/02/1966, 54 y.o.   MRN: [id number]         [facility] for Infectious Disease  Date of Admission:  12/17/2019           Day 8 vancomycin ASSESSMENT: He has persistent MRSA bacteremia with positive blood cultures from 3/18-23.  Repeat blood cultures were obtained this morning.  His vancomycin MIC has not increased over the past week.  He has some nodular changes on CT scan of his chest.  He is at high risk for endocarditis.  Hopefully TEE can be done soon.  PLAN: 1. Continue vancomycin  Principal Problem:   MRSA bacteremia Active Problems:   Hyperlipidemia associated with type 2 diabetes mellitus ([name])   ESRD (end stage renal disease) on dialysis ([name])   Absolute anemia   DM (diabetes mellitus) type 2, uncontrolled, with ketoacidosis ([name])   Hyperkalemia   Hyponatremia   Septic shock ([name])   Sepsis due to infected intravenous catheter ([name])   Wrist pain, acute, left   Scheduled Meds: . Chlorhexidine Gluconate Cloth  6 each Topical Q0600  . ferric citrate  420 mg Oral TID WC  . heparin  5,000 Units Subcutaneous Q8H  . insulin aspart  0-20 Units Subcutaneous TID WC  . lidocaine  15 mL Oral Once  . midodrine  15 mg Oral TID WC  . pantoprazole  40 mg Oral Q0600   Continuous Infusions: . sodium chloride 250 mL (12/27/19 0436)  . sodium chloride 100 mL (01/02/20 0535)  . sodium chloride    . albumin human    . [START ON 12/20/2019] vancomycin     PRN Meds:.sodium chloride, sodium chloride, acetaminophen, alteplase, heparin, lidocaine (PF), lidocaine-prilocaine, pentafluoroprop-tetrafluoroeth   SUBJECTIVE: He complains of hurting all over.  Review of Systems: Review of Systems  Unable to perform ROS: Mental acuity    No Known Allergies  OBJECTIVE: Vitals:   01/02/20 0323 01/02/20 0324 01/02/20 0444 01/02/20 0844  BP: (!) 81/50 (!) 106/43    Pulse: (!) 101 98    Resp: (!) 26 (!) 35    Temp: 99.2 F (37.3 C)    99.8 F (37.7 C)  TempSrc: Oral   Axillary  SpO2: 96% 95%    Weight:   124.3 kg   Height:       Body mass index is 41.67 kg/m.  Physical Exam Constitutional:      Comments: He remains somewhat restless and confused.  Cardiovascular:     Rate and Rhythm: Normal rate and regular rhythm.     Heart sounds: No murmur.     Comments: Distant heart sounds. Pulmonary:     Effort: Pulmonary effort is normal.     Breath sounds: Normal breath sounds.  Abdominal:     Palpations: Abdomen is soft.     Tenderness: There is no abdominal tenderness.  Musculoskeletal:        General: No swelling.     Comments: He is tender with palpation of almost all joints but does not have swelling or erythema other than moderate swelling around his right knee.  Skin:    Findings: No rash.     Lab Results Lab Results  Component Value Date   WBC 41.6 (H) 01/02/2020   HGB 8.9 (L) 01/02/2020   HCT 27.0 (L) 01/02/2020   MCV 87.9 01/02/2020   PLT 373 01/02/2020    Lab Results  Component Value Date   CREATININE 3.36 (H) 01/02/2020  BUN 27 (H) 01/02/2020   NA 132 (L) 01/02/2020   K 5.1 01/02/2020   CL 93 (L) 01/02/2020   CO2 17 (L) 01/02/2020    Lab Results  Component Value Date   ALT 31 12/11/2019   AST 52 (H) 12/10/2019   ALKPHOS 213 (H) 12/22/2019   BILITOT 1.1 12/13/2019     Microbiology: Recent Results (from the past 240 hour(s))  Blood Culture (routine x 2)     Status: Abnormal   Collection Time: 01/04/2020 10:15 PM   Specimen: BLOOD RIGHT ARM  Result Value Ref Range Status   Specimen Description BLOOD RIGHT ARM  Final   Special Requests   Final    BOTTLES DRAWN AEROBIC AND ANAEROBIC Blood Culture results may not be optimal due to an inadequate volume of blood received in culture bottles   Culture  Setup Time   Final    GRAM POSITIVE COCCI IN CLUSTERS IN BOTH AEROBIC AND ANAEROBIC BOTTLES CRITICAL RESULT CALLED TO, READ BACK BY AND VERIFIED WITH: J. Roselie Skinner, AT 1009 12/27/19  DV Performed at [facility], [place] [address] [place], [place] [address]    Culture METHICILLIN RESISTANT STAPHYLOCOCCUS AUREUS (A)  Final   Report Status 12/29/2019 FINAL  Final   Organism ID, Bacteria METHICILLIN RESISTANT STAPHYLOCOCCUS AUREUS  Final      Susceptibility   Methicillin resistant staphylococcus aureus - MIC*    CIPROFLOXACIN <=0.5 SENSITIVE Sensitive     ERYTHROMYCIN >=8 RESISTANT Resistant     GENTAMICIN <=0.5 SENSITIVE Sensitive     OXACILLIN >=4 RESISTANT Resistant     TETRACYCLINE <=1 SENSITIVE Sensitive     VANCOMYCIN 1 SENSITIVE Sensitive     TRIMETH/SULFA <=10 SENSITIVE Sensitive     CLINDAMYCIN <=0.25 SENSITIVE Sensitive     RIFAMPIN <=0.5 SENSITIVE Sensitive     Inducible Clindamycin NEGATIVE Sensitive     * METHICILLIN RESISTANT STAPHYLOCOCCUS AUREUS  Blood Culture (routine x 2)     Status: Abnormal   Collection Time: 12/15/2019 10:41 PM   Specimen: BLOOD RIGHT HAND  Result Value Ref Range Status   Specimen Description BLOOD RIGHT HAND  Final   Special Requests AEROBIC BOTTLE ONLY Blood Culture adequate volume  Final   Culture  Setup Time   Final    GRAM POSITIVE COCCI IN CLUSTERS AEROBIC BOTTLE ONLY CRITICAL VALUE NOTED.  VALUE IS CONSISTENT WITH PREVIOUSLY REPORTED AND CALLED VALUE.    Culture (A)  Final    STAPHYLOCOCCUS AUREUS SUSCEPTIBILITIES PERFORMED ON PREVIOUS CULTURE WITHIN THE LAST 5 DAYS. Performed at [facility], [place] [address], [place], [place] [address]    Report Status 12/29/2019 FINAL  Final  Blood Culture ID Panel (Reflexed)     Status: Abnormal   Collection Time: 12/24/2019 10:41 PM  Result Value Ref Range Status   Enterococcus species NOT DETECTED NOT DETECTED Final   Listeria monocytogenes NOT DETECTED NOT DETECTED Final   Staphylococcus species DETECTED (A) NOT DETECTED Final    Comment: CRITICAL RESULT CALLED TO, READ BACK BY AND VERIFIED WITH: Andres Shad PharmD 14:30 12/27/19 ([email or web address])    Staphylococcus aureus  (BCID) DETECTED (A) NOT DETECTED Final    Comment: Methicillin (oxacillin)-resistant Staphylococcus aureus (MRSA). MRSA is predictably resistant to beta-lactam antibiotics (except ceftaroline). Preferred therapy is vancomycin unless clinically contraindicated. Patient requires contact precautions if  hospitalized. CRITICAL RESULT CALLED TO, READ BACK BY AND VERIFIED WITH: Andres Shad PharmD 14:30 12/27/19 ([email or web address])    Methicillin resistance DETECTED (A) NOT DETECTED  Final    Comment: CRITICAL RESULT CALLED TO, READ BACK BY AND VERIFIED WITH: Andres Shad PharmD 14:30 12/27/19 ([email or web address])    Streptococcus species NOT DETECTED NOT DETECTED Final   Streptococcus agalactiae NOT DETECTED NOT DETECTED Final   Streptococcus pneumoniae NOT DETECTED NOT DETECTED Final   Streptococcus pyogenes NOT DETECTED NOT DETECTED Final   Acinetobacter baumannii NOT DETECTED NOT DETECTED Final   Enterobacteriaceae species NOT DETECTED NOT DETECTED Final   Enterobacter cloacae complex NOT DETECTED NOT DETECTED Final   Escherichia coli NOT DETECTED NOT DETECTED Final   Klebsiella oxytoca NOT DETECTED NOT DETECTED Final   Klebsiella pneumoniae NOT DETECTED NOT DETECTED Final   Proteus species NOT DETECTED NOT DETECTED Final   Serratia marcescens NOT DETECTED NOT DETECTED Final   Haemophilus influenzae NOT DETECTED NOT DETECTED Final   Neisseria meningitidis NOT DETECTED NOT DETECTED Final   Pseudomonas aeruginosa NOT DETECTED NOT DETECTED Final   Candida albicans NOT DETECTED NOT DETECTED Final   Candida glabrata NOT DETECTED NOT DETECTED Final   Candida krusei NOT DETECTED NOT DETECTED Final   Candida parapsilosis NOT DETECTED NOT DETECTED Final   Candida tropicalis NOT DETECTED NOT DETECTED Final    Comment: Performed at [facility], [place]. [address], [place], [place] [address]  Respiratory Panel by RT PCR (Flu A&B, Covid) - Nasopharyngeal Swab     Status: None   Collection Time: 12/13/2019 11:22 PM   Specimen:  Nasopharyngeal Swab  Result Value Ref Range Status   SARS Coronavirus 2 by RT PCR NEGATIVE NEGATIVE Final    Comment: (NOTE) SARS-CoV-2 target nucleic acids are NOT DETECTED. The SARS-CoV-2 RNA is generally detectable in upper respiratoy specimens during the acute phase of infection. The lowest concentration of SARS-CoV-2 viral copies this assay can detect is 131 copies/mL. A negative result does not preclude SARS-Cov-2 infection and should not be used as the sole basis for treatment or other patient management decisions. A negative result may occur with  improper specimen collection/handling, submission of specimen other than nasopharyngeal swab, presence of viral mutation(s) within the areas targeted by this assay, and inadequate number of viral copies (<131 copies/mL). A negative result must be combined with clinical observations, patient history, and epidemiological information. The expected result is Negative. Fact Sheet for Patients:  PinkCheek.be Fact Sheet for Healthcare Providers:  GravelBags.it This test is not yet ap proved or cleared by the [place] FDA and  has been authorized for detection and/or diagnosis of SARS-CoV-2 by FDA under an Emergency Use Authorization (EUA). This EUA will remain  in effect (meaning this test can be used) for the duration of the COVID-19 declaration under Section 564(b)(1) of the Act, 21 U.S.C. section 360bbb-3(b)(1), unless the authorization is terminated or revoked sooner.    Influenza A by PCR NEGATIVE NEGATIVE Final   Influenza B by PCR NEGATIVE NEGATIVE Final    Comment: (NOTE) The Xpert Xpress SARS-CoV-2/FLU/RSV assay is intended as an aid in  the diagnosis of influenza from Nasopharyngeal swab specimens and  should not be used as a sole basis for treatment. Nasal washings and  aspirates are unacceptable for Xpert Xpress SARS-CoV-2/FLU/RSV  testing. Fact Sheet for  Patients: PinkCheek.be Fact Sheet for Healthcare Providers: GravelBags.it This test is not yet approved or cleared by the [place] FDA and  has been authorized for detection and/or diagnosis of SARS-CoV-2 by  FDA under an Emergency Use Authorization (EUA). This EUA will remain  in effect (meaning this test can be used)  for the duration of the  Covid-19 declaration under Section 564(b)(1) of the Act, 21  U.S.C. section 360bbb-3(b)(1), unless the authorization is  terminated or revoked. Performed at [facility], [place] [address], [place], [place] [address]   MRSA PCR Screening     Status: Abnormal   Collection Time: 12/27/19  5:30 AM   Specimen: Nasopharyngeal  Result Value Ref Range Status   MRSA by PCR POSITIVE (A) NEGATIVE Final    Comment:        The GeneXpert MRSA Assay (FDA approved for NASAL specimens only), is one component of a comprehensive MRSA colonization surveillance program. It is not intended to diagnose MRSA infection nor to guide or monitor treatment for MRSA infections. RESULT CALLED TO, READ BACK BY AND VERIFIED WITH: RN Sarajane Jews [phone] FCP Performed at [place] [address], [place], [place] [address]   Culture, blood (routine x 2)     Status: Abnormal   Collection Time: 12/28/19  5:40 AM   Specimen: BLOOD RIGHT HAND  Result Value Ref Range Status   Specimen Description BLOOD RIGHT HAND  Final   Special Requests   Final    BOTTLES DRAWN AEROBIC AND ANAEROBIC Blood Culture adequate volume   Culture  Setup Time   Final    IN BOTH AEROBIC AND ANAEROBIC BOTTLES GRAM POSITIVE COCCI IN CLUSTERS CRITICAL VALUE NOTED.  VALUE IS CONSISTENT WITH PREVIOUSLY REPORTED AND CALLED VALUE.    Culture (A)  Final    STAPHYLOCOCCUS AUREUS SUSCEPTIBILITIES PERFORMED ON PREVIOUS CULTURE WITHIN THE LAST 5 DAYS. Performed at [facility], [place] [address], [place], [place]  [address]    Report Status 01/02/2020 FINAL  Final  Culture, blood (routine x 2)     Status: Abnormal   Collection Time: 12/28/19  5:59 AM   Specimen: BLOOD RIGHT HAND  Result Value Ref Range Status   Specimen Description BLOOD RIGHT HAND  Final   Special Requests IN PEDIATRIC BOTTLE Blood Culture adequate volume  Final   Culture  Setup Time   Final    GRAM POSITIVE COCCI IN PEDIATRIC BOTTLE CRITICAL RESULT CALLED TO, READ BACK BY AND VERIFIED WITH: PHARMD LINDSAY CHEN @2021  12/28/2019 AKT    Culture (A)  Final    STAPHYLOCOCCUS AUREUS SUSCEPTIBILITIES PERFORMED ON PREVIOUS CULTURE WITHIN THE LAST 5 DAYS. Performed at [facility], [place] [address] [place], [place] [address]    Report Status 12/29/2019 FINAL  Final  Culture, blood (routine x 2)     Status: None (Preliminary result)   Collection Time: 12/30/19  6:35 AM   Specimen: BLOOD  Result Value Ref Range Status   Specimen Description BLOOD RIGHT ANTECUBITAL  Final   Special Requests   Final    BOTTLES DRAWN AEROBIC AND ANAEROBIC Blood Culture adequate volume   Culture   Final    NO GROWTH 3 DAYS Performed at [facility], [place] [address], [place], [place] [address]    Report Status PENDING  Incomplete  Culture, blood (routine x 2)     Status: Abnormal   Collection Time: 12/30/19  6:40 AM   Specimen: BLOOD RIGHT HAND  Result Value Ref Range Status   Specimen Description BLOOD RIGHT HAND  Final   Special Requests   Final    BOTTLES DRAWN AEROBIC AND ANAEROBIC Blood Culture adequate volume   Culture  Setup Time   Final    GRAM POSITIVE COCCI IN CLUSTERS IN  BOTH AEROBIC AND ANAEROBIC BOTTLES CRITICAL VALUE NOTED.  VALUE IS CONSISTENT WITH PREVIOUSLY REPORTED AND CALLED VALUE. Performed at [facility], [place] [address], [place], [place] [address]    Culture METHICILLIN RESISTANT STAPHYLOCOCCUS AUREUS (A)  Final   Report Status 01/02/2020 FINAL  Final   Organism ID, Bacteria METHICILLIN RESISTANT STAPHYLOCOCCUS  AUREUS  Final      Susceptibility   Methicillin resistant staphylococcus aureus - MIC*    CIPROFLOXACIN <=0.5 SENSITIVE Sensitive     ERYTHROMYCIN >=8 RESISTANT Resistant     GENTAMICIN <=0.5 SENSITIVE Sensitive     OXACILLIN >=4 RESISTANT Resistant     TETRACYCLINE <=1 SENSITIVE Sensitive     VANCOMYCIN 1 SENSITIVE Sensitive     TRIMETH/SULFA <=10 SENSITIVE Sensitive     CLINDAMYCIN <=0.25 SENSITIVE Sensitive     RIFAMPIN <=0.5 SENSITIVE Sensitive     Inducible Clindamycin NEGATIVE Sensitive     * METHICILLIN RESISTANT STAPHYLOCOCCUS AUREUS  Culture, blood (routine x 2)     Status: None (Preliminary result)   Collection Time: 12/31/19 11:09 AM   Specimen: BLOOD RIGHT HAND  Result Value Ref Range Status   Specimen Description BLOOD RIGHT HAND  Final   Special Requests AEROBIC BOTTLE ONLY Blood Culture adequate volume  Final   Culture   Final    NO GROWTH 2 DAYS Performed at [facility], [place] [address], [place], [place] [address]    Report Status PENDING  Incomplete  Culture, blood (routine x 2)     Status: Abnormal (Preliminary result)   Collection Time: 12/31/19 11:23 AM   Specimen: BLOOD RIGHT HAND  Result Value Ref Range Status   Specimen Description BLOOD RIGHT HAND  Final   Special Requests   Final    AEROBIC BOTTLE ONLY Blood Culture results may not be optimal due to an inadequate volume of blood received in culture bottles   Culture  Setup Time   Final    AEROBIC BOTTLE ONLY GRAM POSITIVE COCCI CRITICAL VALUE NOTED.  VALUE IS CONSISTENT WITH PREVIOUSLY REPORTED AND CALLED VALUE.    Culture (A)  Final    STAPHYLOCOCCUS AUREUS SUSCEPTIBILITIES PERFORMED ON PREVIOUS CULTURE WITHIN THE LAST 5 DAYS. Performed at [facility], [place] [address], [place], [place] [address]    Report Status PENDING  Incomplete  Culture, blood (Routine X 2) w Reflex to ID Panel     Status: None (Preliminary result)   Collection Time: 01/02/20 12:40 AM   Specimen: BLOOD RIGHT HAND    Result Value Ref Range Status   Specimen Description BLOOD RIGHT HAND  Final   Special Requests AEROBIC BOTTLE ONLY Blood Culture adequate volume  Final   Culture   Final    NO GROWTH < 12 HOURS Performed at [facility], [place] [address], [place], [place] [address]    Report Status PENDING  Incomplete  Culture, blood (Routine X 2) w Reflex to ID Panel     Status: None (Preliminary result)   Collection Time: 01/02/20 12:50 AM   Specimen: BLOOD RIGHT HAND  Result Value Ref Range Status   Specimen Description BLOOD RIGHT HAND  Final   Special Requests   Final    BOTTLES DRAWN AEROBIC AND ANAEROBIC Blood Culture adequate volume   Culture   Final    NO GROWTH < 12 HOURS Performed at [facility], [place] [address], [place], [place] [address]    Report Status PENDING  Incomplete  Michel Bickers, MD [facility] for [facility] [place] [facility] [phone] pager   [phone] cell 01/02/2020, 11:12 AM

## 2020-01-02 NOTE — Significant Event (Addendum)
Rapid Response Event Note  Overview: Time Called: 1419 Arrival Time: 1430 Event Type: MEWS Red MEWS. MAPS <65.   Initial Focused Assessment: Pt lying in bed, alert, oriented. Pt is able to follow commands and move all extremities. Pt is very warm, moist to touch. Pt denies shortness of breath or difficulty breathing. Pt denies pain. Pt is noted to be tachycardic, tachypneic, and hypotensive. BP cuff repositioned and rechecked with improvement in BP reading. Per primary RN, pt's mentation is at baseline. SpO2 is 98% on 4LNC. Rectal temperature evaluated, 100.73F. Pt currently receiving albumin. Lung sounds are diminished.    VS: T 100.73F rectal, 96/65 (73), HR 110, RR 32, SpO2 96% on 4LNC   Interventions: -Rectal temperature check -PRN Tylenol given, reevaluate temperature within 1-2 hours after treatment.  Plan of Care (if not transferred): -Trend vital signs -Treat fever -MAP goal >65  Event Summary: Name of Physician Notified: Dr. Avon Gully at (Notified by primary RN.) Outcome: Stayed in room and stabalized Event End Time: [place]

## 2020-01-03 ENCOUNTER — Encounter (HOSPITAL_COMMUNITY): Payer: Self-pay | Admitting: Certified Registered Nurse Anesthetist

## 2020-01-03 ENCOUNTER — Inpatient Hospital Stay (HOSPITAL_COMMUNITY): Payer: Medicare Other

## 2020-01-03 ENCOUNTER — Encounter (HOSPITAL_COMMUNITY): Admission: EM | Disposition: E | Payer: Self-pay | Source: Home / Self Care | Attending: Internal Medicine

## 2020-01-03 DIAGNOSIS — B9562 Methicillin resistant Staphylococcus aureus infection as the cause of diseases classified elsewhere: Secondary | ICD-10-CM | POA: Diagnosis not present

## 2020-01-03 DIAGNOSIS — R7881 Bacteremia: Secondary | ICD-10-CM | POA: Diagnosis not present

## 2020-01-03 DIAGNOSIS — I959 Hypotension, unspecified: Secondary | ICD-10-CM

## 2020-01-03 DIAGNOSIS — M25461 Effusion, right knee: Secondary | ICD-10-CM

## 2020-01-03 DIAGNOSIS — R509 Fever, unspecified: Secondary | ICD-10-CM

## 2020-01-03 DIAGNOSIS — R410 Disorientation, unspecified: Secondary | ICD-10-CM | POA: Diagnosis not present

## 2020-01-03 DIAGNOSIS — R5383 Other fatigue: Secondary | ICD-10-CM

## 2020-01-03 LAB — CBC WITH DIFFERENTIAL/PLATELET
Abs Immature Granulocytes: 2.89 10*3/uL — ABNORMAL HIGH (ref 0.00–0.07)
Basophils Absolute: 0.1 10*3/uL (ref 0.0–0.1)
Basophils Relative: 0 %
Eosinophils Absolute: 0.6 10*3/uL — ABNORMAL HIGH (ref 0.0–0.5)
Eosinophils Relative: 2 %
HCT: 26.6 % — ABNORMAL LOW (ref 39.0–52.0)
Hemoglobin: 8.5 g/dL — ABNORMAL LOW (ref 13.0–17.0)
Immature Granulocytes: 8 %
Lymphocytes Relative: 6 %
Lymphs Abs: 2.1 10*3/uL (ref 0.7–4.0)
MCH: 28.3 pg (ref 26.0–34.0)
MCHC: 32 g/dL (ref 30.0–36.0)
MCV: 88.7 fL (ref 80.0–100.0)
Monocytes Absolute: 1.5 10*3/uL — ABNORMAL HIGH (ref 0.1–1.0)
Monocytes Relative: 4 %
Neutro Abs: 28.8 10*3/uL — ABNORMAL HIGH (ref 1.7–7.7)
Neutrophils Relative %: 80 %
Platelets: 354 10*3/uL (ref 150–400)
RBC: 3 MIL/uL — ABNORMAL LOW (ref 4.22–5.81)
RDW: 17.4 % — ABNORMAL HIGH (ref 11.5–15.5)
WBC: 36 10*3/uL — ABNORMAL HIGH (ref 4.0–10.5)
nRBC: 1.3 % — ABNORMAL HIGH (ref 0.0–0.2)

## 2020-01-03 LAB — RENAL FUNCTION PANEL
Albumin: 1.7 g/dL — ABNORMAL LOW (ref 3.5–5.0)
Anion gap: 17 — ABNORMAL HIGH (ref 5–15)
BUN: 61 mg/dL — ABNORMAL HIGH (ref 6–20)
CO2: 22 mmol/L (ref 22–32)
Calcium: 9 mg/dL (ref 8.9–10.3)
Chloride: 96 mmol/L — ABNORMAL LOW (ref 98–111)
Creatinine, Ser: 5.79 mg/dL — ABNORMAL HIGH (ref 0.61–1.24)
GFR calc Af Amer: 12 mL/min — ABNORMAL LOW (ref >60)
GFR calc non Af Amer: 10 mL/min — ABNORMAL LOW (ref >60)
Glucose, Bld: 186 mg/dL — ABNORMAL HIGH (ref 70–99)
Phosphorus: 6 mg/dL — ABNORMAL HIGH (ref 2.5–4.6)
Potassium: 4.4 mmol/L (ref 3.5–5.1)
Sodium: 135 mmol/L (ref 135–145)

## 2020-01-03 LAB — CULTURE, BLOOD (ROUTINE X 2)

## 2020-01-03 LAB — GLUCOSE, CAPILLARY
Glucose-Capillary: 183 mg/dL — ABNORMAL HIGH (ref 70–99)
Glucose-Capillary: 168 mg/dL — ABNORMAL HIGH (ref 70–99)
Glucose-Capillary: 174 mg/dL — ABNORMAL HIGH (ref 70–99)

## 2020-01-03 LAB — VANCOMYCIN, RANDOM: Vancomycin Rm: 27

## 2020-01-03 SURGERY — CANCELLED PROCEDURE

## 2020-01-03 MED ORDER — ACETAMINOPHEN 650 MG RE SUPP
650.0000 mg | Freq: Three times a day (TID) | RECTAL | Status: DC | PRN
  Administered 2020-01-03 – 2020-01-04 (×2): 650 mg via RECTAL
  Filled 2020-01-03 (×2): qty 1

## 2020-01-03 MED ORDER — ALBUMIN HUMAN 25 % IV SOLN
INTRAVENOUS | Status: AC
  Administered 2020-01-03: 25 g via INTRAVENOUS
  Filled 2020-01-03: qty 100

## 2020-01-03 MED ORDER — VANCOMYCIN HCL IN DEXTROSE 1-5 GM/200ML-% IV SOLN
INTRAVENOUS | Status: AC
  Administered 2020-01-03: 1000 mg via INTRAVENOUS
  Filled 2020-01-03: qty 200

## 2020-01-03 MED ORDER — ALBUMIN HUMAN 25 % IV SOLN: 25.0000 g | Freq: Once | INTRAVENOUS | Status: AC

## 2020-01-03 MED ORDER — HEPARIN SODIUM (PORCINE) 1000 UNIT/ML IJ SOLN
INTRAMUSCULAR | Status: AC
  Administered 2020-01-03: 5000 [IU] via INTRAVENOUS_CENTRAL
  Filled 2020-01-03: qty 5

## 2020-01-03 MED ORDER — MIDODRINE HCL 5 MG PO TABS
ORAL_TABLET | ORAL | Status: AC
  Administered 2020-01-03: 15 mg via ORAL
  Filled 2020-01-03: qty 3

## 2020-01-03 NOTE — Progress Notes (Addendum)
PROGRESS NOTE    Seth [name]  ONG:[id number] DOB: 1966/06/12 DOA: 12/31/2019 PCP: Mitzi Hansen, MD    Brief Narrative: 54 year old male with PMH as below, which is significant for ESRD on HD, chronic hypoxemic respiratory failure no home O2, OSA on CPAP, DM, discitis, and cord compression myelopathy. Most recent HD was 3/17. He left the session 45 mins early due to back pain. He was in his usual state of health until approximately 3/11 when he developed muscle aches and back pain. He had just received his 1st COVID-19 vaccine a few days prior and it was felt these symptoms were secondary to this. He has been seen twice in the ED in the past week. First for complaints of febrile illness. He was not given any antibiotics at that time. He then returned 3/18 with complaints of myalgias. He returned to ED later in the day 3/18 via EMS after being found to have altered mental status at home. Upon arrival to ED he was noted to be hypotensive with SBP in the 60s and hypoxemic with O2 sats in the 70s on room air. Improved with supplemental O2 and levophed infusion after 291m bolus. There were also concerns he had been unable to use his legs the past few days and MRI spine was ordered. PCCM asked to evaluate for admission due to need for vasoactive infusion.  Patient initially admitted to ICU for pressor management given septic shock, markedly improved, likely infected hemodialysis catheter removed on 12/27/2019, recurrent blood cultures remain positive. ID following.  Assessment & Plan:   Principal Problem:   MRSA bacteremia Active Problems:   Hyperlipidemia associated with type 2 diabetes mellitus ([place]   ESRD (end stage renal disease) on dialysis ([name])   Absolute anemia   DM (diabetes mellitus) type 2, uncontrolled, with ketoacidosis ([name])   Hyperkalemia   Hyponatremia   Septic shock ([name])   Sepsis due to infected intravenous catheter ([name])   Wrist pain, acute, left  Septic shock  secondary to MRSA bacteremia secondary to infected HD catheter/ Patient had presented noted to be in septic shock requiring pressors felt secondary to infected HD catheter.  Infected HD catheter removed 12/27/2019 with purulence noted.  Patient remains off pressors. Blood cultures positive for MRSA.  2D echo negative for any vegetations endocarditis.  Repeat blood cultures from 12/28/2019 + for staph aureus. 2 of 5 blood cultures 3/22 remain positive.  TEE unable to be performed, patient now refusing -will consider patient endocarditis until this can be ruled out given ongoing hypotension, leukocytosis, and fevers -01/02/2020 late afternoon and overnight rapid response called due to low blood pressure and patient's mental status.  Patient able to wake up oriented although continues to be markedly somnolent likely secondary to above multifactorial conditions. -01/02/2020 patient undergoing dialysis -able to arouse and answer simple questions, blood pressure currently well controlled, ID at bedside we discussed continuing current antibiotics, patient remains high risk given prolonged infection, patient refused TEE -we will now treat as endocarditis with 6 weeks of antibiotics as we cannot rule out endocarditis.  Blood cultures from 325 remain preliminary negative  Acute metabolic encephalopathy, multifactorial  -Likely secondary to above prolonged sepsis with what appears to be a large infectious burden given ongoing hypotension and fevers. -If mental status does not improve in the next 24 to 48 hours or continues to worsen would consider MRI to further evaluate for infectious burden however this would likely not change our clinical course as his antibiotics appear to  be appropriate given MIC in discussion with ID, this is likely secondary to infectious burden, dialysis, uremia, polypharmacy less likely given patient's not currently on any narcotic or sedating  medication.  Hyperkalemia/hyponatremia/acidosis/hyperphosphatemia Secondary to end-stage renal disease/dietary noncompliance.  Ongoing dialysis schedule and dialysis management per nephrology.   Lower extremity weakness, concurrent ambulatory dysfunction Patient still with lower extremity weakness with slight improvement.  Initially unable to obtain MRI due to claustrophobia however was given some Ativan and MRI obtained 12/29/2019.  MRI of the L-spine obtained with no acute abnormality or evidence for infection within the lumbar spine, technically limited exam due to motion artifact.  10 mm anterior listhesis of L5 on S1 with associated moderate bilateral subarticular stenosis, with moderate to severe bilateral L5 foraminal narrowing.  Right foraminal to extraforaminal disc protrusion at L3-4 potentially affecting the exiting right L3 nerve root.  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  PT/OT evaluation ongoing -possible need for placement pending further evaluation and clinical course.   Diabetes mellitus II, uncontrolled Hemoglobin A1c of 7.1.  CBG of 153 this morning.  Continue sliding scale insulin.  End-stage renal disease MWF schedule Uremia resolved Patient being followed by nephrology as above  OSA CPAP as tolerated  Hypertension, essential - currently borderline hypotensive Midodrine 15 mg 3 times daily per nephrology.  Continue to hold antihypertensives as indicated   DVT prophylaxis: Heparin Code Status: Full Family Communication: Updated patient.   Disposition Plan:  . Patient admitted from: Home        . Anticipated d/c place: Undetermined - SNF vs Home with home health pending clinical course  . Barriers to d/c OR conditions which need to be met to effect a safe d/c: Likely home with home health versus SNF pending PT evaluation, once clinically improved, improvement with hypotension/sepsis, electrolyte  abnormalities corrected, improving leukocytosis, pending TEE, cleared by ID and nephrology.   Consultants:   Patient admitted to critical care service  ID: Dr.Comer 12/27/2019  Nephrology: Dr. Hollie Salk 12/27/2019  Procedures:   CT left wrist 12/27/2019  CT angiogram chest 12/15/2019  Plain films of the left wrist 12/27/2019  Chest x-ray 12/18/2019  Incomplete MRI T and L-spine 12/27/2019--- patient could not tolerate due to claustrophobia.  2D echo 12/27/2019  Successful removal of tunneled left IJ hemodialysis catheter with purulence expressed from tract per Ascencion Dike, PA interventional radiology 12/27/2019  Antimicrobials:  IV Zosyn 12/27/2019 x1  IV vancomycin 01/08/2020   Subjective: Rapid response overnight again due to low blood pressure, mental status changes.  Patient this morning appears well in dialysis although somewhat somnolent but arousable and answers questions appropriately.  Review of systems somewhat limited due to patient's current status.  Objective: Vitals:   12/23/2019 0500 12/24/2019 0525 01/04/2020 0714 01/06/2020 0715  BP:    (!) 88/53  Pulse:  (!) 108  (!) 108  Resp:  (!) 32  (!) 26  Temp:  (!) 101.9 F (38.8 C) (!) 105 F (40.6 C) (!) 105 F (40.6 C)  TempSrc:  Axillary Oral Oral  SpO2:  97%  97%  Weight: 123.2 kg     Height:        Intake/Output Summary (Last 24 hours) at 12/11/2019 0732 Last data filed at 12/31/2019 0049 Gross per 24 hour  Intake 1143.93 ml  Output --  Net 1143.93 ml   Filed Weights   12/12/2019 2317 01/02/20 0444 12/13/2019 0500  Weight: 129 kg 124.3 kg 123.2 kg  Examination:  General exam: NAD. Resting comfortably, somnolent but easily arousable -answer simple questions appropriately. Respiratory system: CTAB anterior lung fields with no wheezes, no crackles, no rhonchi.  Normal respiratory effort.   Cardiovascular system: Regular rate rhythm no murmurs rubs or gallops.  No JVD.  No lower extremity edema.   Gastrointestinal system: Abdomen is soft, nontender, nondistended, positive bowel sounds.  No rebound.  No guarding.   Central nervous system: Somewhat drowsy however following commands moving extremities.   Extremities: 3-4/5 bilateral lower extremity strength.   Data Reviewed: I have personally reviewed following labs and imaging studies  CBC: Recent Labs  Lab 12/30/19 0644 12/30/19 0644 12/31/19 0428 12/11/2019 0245 01/02/20 0054 01/02/20 1607 12/25/2019 0333  WBC 24.9*   < > 29.1* 32.5* 41.6* 35.6* 36.0*  NEUTROABS 21.4*  --  25.0* 25.1* 33.2*  --  28.8*  HGB 9.5*   < > 9.2* 9.0* 8.9* 8.6* 8.5*  HCT 27.7*   < > 26.9* 26.7* 27.0* 26.0* 26.6*  MCV 84.7   < > 84.9 85.3 87.9 88.4 88.7  PLT 329   < > 342 376 373 354 354   < > = values in this interval not displayed.   Basic Metabolic Panel: Recent Labs  Lab 12/28/19 0818 12/28/19 0818 12/29/19 9983 12/29/19 3825 12/30/19 0539 12/30/19 7673 12/31/19 0428 12/23/2019 0245 01/02/20 0054 01/02/20 1607 12/10/2019 0333  NA 125*   < > 129*   < > 127*   < > 126* 130* 132* 134* 135  K 6.3*   < > 4.7   < > 4.9   < > 5.0 5.3* 5.1 4.0 4.4  CL 87*   < > 90*   < > 88*   < > 87* 91* 93* 95* 96*  CO2 19*   < > 20*   < > 18*   < > 22 22 17* 23 22  GLUCOSE 114*   < > 135*   < > 147*   < > 144* 170* 158* 178* 186*  BUN 81*   < > 53*   < > 79*   < > 102* 56* 27* 49* 61*  CREATININE 10.97*   < > 6.56*   < > 7.47*   < > 8.57* 5.47* 3.36* 5.02* 5.79*  CALCIUM 7.6*   < > 8.7*   < > 8.6*   < > 8.8* 8.8* 9.2 9.3 9.0  MG 2.0  --  1.9  --   --   --   --   --   --   --   --   PHOS 7.0*   < > 6.1*   < > 6.3*  --  6.4* 5.7* 4.7*  --  6.0*   < > = values in this interval not displayed.   GFR: Estimated Creatinine Clearance: 18.4 mL/min (A) (by C-G formula based on SCr of 5.79 mg/dL (H)). Liver Function Tests: Recent Labs  Lab 12/31/19 0428 12/20/2019 0245 01/02/20 0054 01/02/20 1607 12/23/2019 0333  AST  --   --   --  87*  --   ALT  --   --   --  30   --   ALKPHOS  --   --   --  256*  --   BILITOT  --   --   --  1.3*  --   PROT  --   --   --  7.7  --   ALBUMIN 1.6* 1.7* 1.6* 1.7* 1.7*  No results for input(s): LIPASE, AMYLASE in the last 168 hours. Recent Labs  Lab 01/02/20 1704  AMMONIA 31   Coagulation Profile: No results for input(s): INR, PROTIME in the last 168 hours. Recent Labs  Lab 01/02/20 0845 01/02/20 1236 01/02/20 1727 01/02/20 2122 01/04/2020 0711  GLUCAP 178* 165* 163* 161* 183*   Sepsis Labs: Recent Labs  Lab 01/02/20 1704 01/02/20 1815  LATICACIDVEN 1.9 1.5    Recent Results (from the past 240 hour(s))  Blood Culture (routine x 2)     Status: Abnormal   Collection Time: 01/02/2020 10:15 PM   Specimen: BLOOD RIGHT ARM  Result Value Ref Range Status   Specimen Description BLOOD RIGHT ARM  Final   Special Requests   Final    BOTTLES DRAWN AEROBIC AND ANAEROBIC Blood Culture results may not be optimal due to an inadequate volume of blood received in culture bottles   Culture  Setup Time   Final    GRAM POSITIVE COCCI IN CLUSTERS IN BOTH AEROBIC AND ANAEROBIC BOTTLES CRITICAL RESULT CALLED TO, READ BACK BY AND VERIFIED WITH: J. Roselie Skinner, AT 1009 12/27/19 DV Performed at [facility], [place] [address] [place], [place] [address]    Culture METHICILLIN RESISTANT STAPHYLOCOCCUS AUREUS (A)  Final   Report Status 12/29/2019 FINAL  Final   Organism ID, Bacteria METHICILLIN RESISTANT STAPHYLOCOCCUS AUREUS  Final      Susceptibility   Methicillin resistant staphylococcus aureus - MIC*    CIPROFLOXACIN <=0.5 SENSITIVE Sensitive     ERYTHROMYCIN >=8 RESISTANT Resistant     GENTAMICIN <=0.5 SENSITIVE Sensitive     OXACILLIN >=4 RESISTANT Resistant     TETRACYCLINE <=1 SENSITIVE Sensitive     VANCOMYCIN 1 SENSITIVE Sensitive     TRIMETH/SULFA <=10 SENSITIVE Sensitive     CLINDAMYCIN <=0.25 SENSITIVE Sensitive     RIFAMPIN <=0.5 SENSITIVE Sensitive     Inducible Clindamycin NEGATIVE Sensitive     *  METHICILLIN RESISTANT STAPHYLOCOCCUS AUREUS  Blood Culture (routine x 2)     Status: Abnormal   Collection Time: 12/21/2019 10:41 PM   Specimen: BLOOD RIGHT HAND  Result Value Ref Range Status   Specimen Description BLOOD RIGHT HAND  Final   Special Requests AEROBIC BOTTLE ONLY Blood Culture adequate volume  Final   Culture  Setup Time   Final    GRAM POSITIVE COCCI IN CLUSTERS AEROBIC BOTTLE ONLY CRITICAL VALUE NOTED.  VALUE IS CONSISTENT WITH PREVIOUSLY REPORTED AND CALLED VALUE.    Culture (A)  Final    STAPHYLOCOCCUS AUREUS SUSCEPTIBILITIES PERFORMED ON PREVIOUS CULTURE WITHIN THE LAST 5 DAYS. Performed at [facility], [place] [address], [place], [place] [address]    Report Status 12/29/2019 FINAL  Final  Blood Culture ID Panel (Reflexed)     Status: Abnormal   Collection Time: 12/10/2019 10:41 PM  Result Value Ref Range Status   Enterococcus species NOT DETECTED NOT DETECTED Final   Listeria monocytogenes NOT DETECTED NOT DETECTED Final   Staphylococcus species DETECTED (A) NOT DETECTED Final    Comment: CRITICAL RESULT CALLED TO, READ BACK BY AND VERIFIED WITH: Andres Shad PharmD 14:30 12/27/19 ([email or web address])    Staphylococcus aureus (BCID) DETECTED (A) NOT DETECTED Final    Comment: Methicillin (oxacillin)-resistant Staphylococcus aureus (MRSA). MRSA is predictably resistant to beta-lactam antibiotics (except ceftaroline). Preferred therapy is vancomycin unless clinically contraindicated. Patient requires contact precautions if  hospitalized. CRITICAL RESULT CALLED TO, READ BACK BY AND VERIFIED WITH: Andres Shad PharmD 14:30  12/27/19 ([email or web address])    Methicillin resistance DETECTED (A) NOT DETECTED Final    Comment: CRITICAL RESULT CALLED TO, READ BACK BY AND VERIFIED WITH: Andres Shad PharmD 14:30 12/27/19 ([email or web address])    Streptococcus species NOT DETECTED NOT DETECTED Final   Streptococcus agalactiae NOT DETECTED NOT DETECTED Final   Streptococcus pneumoniae NOT DETECTED NOT DETECTED Final    Streptococcus pyogenes NOT DETECTED NOT DETECTED Final   Acinetobacter baumannii NOT DETECTED NOT DETECTED Final   Enterobacteriaceae species NOT DETECTED NOT DETECTED Final   Enterobacter cloacae complex NOT DETECTED NOT DETECTED Final   Escherichia coli NOT DETECTED NOT DETECTED Final   Klebsiella oxytoca NOT DETECTED NOT DETECTED Final   Klebsiella pneumoniae NOT DETECTED NOT DETECTED Final   Proteus species NOT DETECTED NOT DETECTED Final   Serratia marcescens NOT DETECTED NOT DETECTED Final   Haemophilus influenzae NOT DETECTED NOT DETECTED Final   Neisseria meningitidis NOT DETECTED NOT DETECTED Final   Pseudomonas aeruginosa NOT DETECTED NOT DETECTED Final   Candida albicans NOT DETECTED NOT DETECTED Final   Candida glabrata NOT DETECTED NOT DETECTED Final   Candida krusei NOT DETECTED NOT DETECTED Final   Candida parapsilosis NOT DETECTED NOT DETECTED Final   Candida tropicalis NOT DETECTED NOT DETECTED Final    Comment: Performed at [facility], [address], [place], [place] [address]  Respiratory Panel by RT PCR (Flu A&B, Covid) - Nasopharyngeal Swab     Status: None   Collection Time: 12/09/2019 11:22 PM   Specimen: Nasopharyngeal Swab  Result Value Ref Range Status   SARS Coronavirus 2 by RT PCR NEGATIVE NEGATIVE Final    Comment: (NOTE) SARS-CoV-2 target nucleic acids are NOT DETECTED. The SARS-CoV-2 RNA is generally detectable in upper respiratoy specimens during the acute phase of infection. The lowest concentration of SARS-CoV-2 viral copies this assay can detect is 131 copies/mL. A negative result does not preclude SARS-Cov-2 infection and should not be used as the sole basis for treatment or other patient management decisions. A negative result may occur with  improper specimen collection/handling, submission of specimen other than nasopharyngeal swab, presence of viral mutation(s) within the areas targeted by this assay, and inadequate number of viral  copies (<131 copies/mL). A negative result must be combined with clinical observations, patient history, and epidemiological information. The expected result is Negative. Fact Sheet for Patients:  PinkCheek.be Fact Sheet for Healthcare Providers:  GravelBags.it This test is not yet ap proved or cleared by the [place] FDA and  has been authorized for detection and/or diagnosis of SARS-CoV-2 by FDA under an Emergency Use Authorization (EUA). This EUA will remain  in effect (meaning this test can be used) for the duration of the COVID-19 declaration under Section 564(b)(1) of the Act, 21 U.S.C. section 360bbb-3(b)(1), unless the authorization is terminated or revoked sooner.    Influenza A by PCR NEGATIVE NEGATIVE Final   Influenza B by PCR NEGATIVE NEGATIVE Final    Comment: (NOTE) The Xpert Xpress SARS-CoV-2/FLU/RSV assay is intended as an aid in  the diagnosis of influenza from Nasopharyngeal swab specimens and  should not be used as a sole basis for treatment. Nasal washings and  aspirates are unacceptable for Xpert Xpress SARS-CoV-2/FLU/RSV  testing. Fact Sheet for Patients: PinkCheek.be Fact Sheet for Healthcare Providers: GravelBags.it This test is not yet approved or cleared by the [place] FDA and  has been authorized for detection and/or diagnosis of SARS-CoV-2 by  FDA under an Emergency Use Authorization (EUA). This EUA  will remain  in effect (meaning this test can be used) for the duration of the  Covid-19 declaration under Section 564(b)(1) of the Act, 21  U.S.C. section 360bbb-3(b)(1), unless the authorization is  terminated or revoked. Performed at [facility], [place] [address], [place], [place] [address]   MRSA PCR Screening     Status: Abnormal   Collection Time: 12/27/19  5:30 AM   Specimen: Nasopharyngeal  Result Value Ref Range  Status   MRSA by PCR POSITIVE (A) NEGATIVE Final    Comment:        The GeneXpert MRSA Assay (FDA approved for NASAL specimens only), is one component of a comprehensive MRSA colonization surveillance program. It is not intended to diagnose MRSA infection nor to guide or monitor treatment for MRSA infections. RESULT CALLED TO, READ BACK BY AND VERIFIED WITH: RN Sarajane Jews [id number] FCP Performed at [place] [address], [place], [place] [address]   Culture, blood (routine x 2)     Status: Abnormal   Collection Time: 12/28/19  5:40 AM   Specimen: BLOOD RIGHT HAND  Result Value Ref Range Status   Specimen Description BLOOD RIGHT HAND  Final   Special Requests   Final    BOTTLES DRAWN AEROBIC AND ANAEROBIC Blood Culture adequate volume   Culture  Setup Time   Final    IN BOTH AEROBIC AND ANAEROBIC BOTTLES GRAM POSITIVE COCCI IN CLUSTERS CRITICAL VALUE NOTED.  VALUE IS CONSISTENT WITH PREVIOUSLY REPORTED AND CALLED VALUE.    Culture (A)  Final    STAPHYLOCOCCUS AUREUS SUSCEPTIBILITIES PERFORMED ON PREVIOUS CULTURE WITHIN THE LAST 5 DAYS. Performed at [facility], [place] [address], [place], [place] [address]    Report Status 01/02/2020 FINAL  Final  Culture, blood (routine x 2)     Status: Abnormal   Collection Time: 12/28/19  5:59 AM   Specimen: BLOOD RIGHT HAND  Result Value Ref Range Status   Specimen Description BLOOD RIGHT HAND  Final   Special Requests IN PEDIATRIC BOTTLE Blood Culture adequate volume  Final   Culture  Setup Time   Final    GRAM POSITIVE COCCI IN PEDIATRIC BOTTLE CRITICAL RESULT CALLED TO, READ BACK BY AND VERIFIED WITH: PHARMD LINDSAY CHEN '@2021'$  12/28/2019 AKT    Culture (A)  Final    STAPHYLOCOCCUS AUREUS SUSCEPTIBILITIES PERFORMED ON PREVIOUS CULTURE WITHIN THE LAST 5 DAYS. Performed at [facility], [place] [address] [place], [place] [address]    Report Status 12/29/2019 FINAL  Final  Culture, blood (routine x 2)      Status: None (Preliminary result)   Collection Time: 12/30/19  6:35 AM   Specimen: BLOOD  Result Value Ref Range Status   Specimen Description BLOOD RIGHT ANTECUBITAL  Final   Special Requests   Final    BOTTLES DRAWN AEROBIC AND ANAEROBIC Blood Culture adequate volume   Culture   Final    NO GROWTH 3 DAYS Performed at [facility], [place] [address], [place], [place] [address]    Report Status PENDING  Incomplete  Culture, blood (routine x 2)     Status: Abnormal   Collection Time: 12/30/19  6:40 AM   Specimen: BLOOD RIGHT HAND  Result Value Ref Range Status   Specimen Description BLOOD RIGHT HAND  Final   Special Requests   Final    BOTTLES DRAWN AEROBIC AND ANAEROBIC Blood Culture adequate volume   Culture  Setup Time  Final    GRAM POSITIVE COCCI IN CLUSTERS IN BOTH AEROBIC AND ANAEROBIC BOTTLES CRITICAL VALUE NOTED.  VALUE IS CONSISTENT WITH PREVIOUSLY REPORTED AND CALLED VALUE. Performed at [facility], [place] [address], [place], [place] [address]    Culture METHICILLIN RESISTANT STAPHYLOCOCCUS AUREUS (A)  Final   Report Status 01/02/2020 FINAL  Final   Organism ID, Bacteria METHICILLIN RESISTANT STAPHYLOCOCCUS AUREUS  Final      Susceptibility   Methicillin resistant staphylococcus aureus - MIC*    CIPROFLOXACIN <=0.5 SENSITIVE Sensitive     ERYTHROMYCIN >=8 RESISTANT Resistant     GENTAMICIN <=0.5 SENSITIVE Sensitive     OXACILLIN >=4 RESISTANT Resistant     TETRACYCLINE <=1 SENSITIVE Sensitive     VANCOMYCIN 1 SENSITIVE Sensitive     TRIMETH/SULFA <=10 SENSITIVE Sensitive     CLINDAMYCIN <=0.25 SENSITIVE Sensitive     RIFAMPIN <=0.5 SENSITIVE Sensitive     Inducible Clindamycin NEGATIVE Sensitive     * METHICILLIN RESISTANT STAPHYLOCOCCUS AUREUS  Culture, blood (routine x 2)     Status: None (Preliminary result)   Collection Time: 12/31/19 11:09 AM   Specimen: BLOOD RIGHT HAND  Result Value Ref Range Status   Specimen Description BLOOD RIGHT HAND  Final    Special Requests AEROBIC BOTTLE ONLY Blood Culture adequate volume  Final   Culture   Final    NO GROWTH 2 DAYS Performed at [facility], [place] [address] [place], [place] [address]    Report Status PENDING  Incomplete  Culture, blood (routine x 2)     Status: Abnormal   Collection Time: 12/31/19 11:23 AM   Specimen: BLOOD RIGHT HAND  Result Value Ref Range Status   Specimen Description BLOOD RIGHT HAND  Final   Special Requests   Final    AEROBIC BOTTLE ONLY Blood Culture results may not be optimal due to an inadequate volume of blood received in culture bottles   Culture  Setup Time   Final    AEROBIC BOTTLE ONLY GRAM POSITIVE COCCI CRITICAL VALUE NOTED.  VALUE IS CONSISTENT WITH PREVIOUSLY REPORTED AND CALLED VALUE.    Culture (A)  Final    STAPHYLOCOCCUS AUREUS SUSCEPTIBILITIES PERFORMED ON PREVIOUS CULTURE WITHIN THE LAST 5 DAYS. Performed at [facility], [place] [address] [place], [place] [address]    Report Status 12/29/2019 FINAL  Final  Culture, blood (Routine X 2) w Reflex to ID Panel     Status: None (Preliminary result)   Collection Time: 01/02/20 12:40 AM   Specimen: BLOOD RIGHT HAND  Result Value Ref Range Status   Specimen Description BLOOD RIGHT HAND  Final   Special Requests AEROBIC BOTTLE ONLY Blood Culture adequate volume  Final   Culture   Final    NO GROWTH < 12 HOURS Performed at [facility], [place] [address] [place], [place] [address]    Report Status PENDING  Incomplete  Culture, blood (Routine X 2) w Reflex to ID Panel     Status: None (Preliminary result)   Collection Time: 01/02/20 12:50 AM   Specimen: BLOOD RIGHT HAND  Result Value Ref Range Status   Specimen Description BLOOD RIGHT HAND  Final   Special Requests   Final    BOTTLES DRAWN AEROBIC AND ANAEROBIC Blood Culture adequate volume   Culture   Final    NO GROWTH < 12 HOURS Performed at [facility], [place] [address] [place], [place] [address]  Report Status  PENDING  Incomplete     Radiology Studies: VAS US DUPLEX DIALYSIS ACCESS (AVF, AVG)  Result Date: 12/10/2019 DIALYSIS ACCESS Reason for Exam: Unable to dialyze through AVF/AVG. Access Site: Left Upper Extremity. Access Type: Brachial-basilic AVF. Comparison Study: 06/04/2019 Performing Technologist: Maudry Mayhew MHA, RDMS, RVT, RDCS  Examination Guidelines: A complete evaluation includes B-mode imaging, spectral Doppler, color Doppler, and power Doppler as needed of all accessible portions of each vessel. Unilateral testing is considered an integral part of a complete examination. Limited examinations for reoccurring indications may be performed as noted.  Findings: +--------------------+----------+-----------------+--------+ AVF                 PSV (cm/s)Flow Vol (mL/min)Comments +--------------------+----------+-----------------+--------+ Native artery inflow   269           361                +--------------------+----------+-----------------+--------+ AVF Anastomosis        259                              +--------------------+----------+-----------------+--------+  +------------+----------+-------------+----------+----------------+ OUTFLOW VEINPSV (cm/s)Diameter (cm)Depth (cm)    Describe     +------------+----------+-------------+----------+----------------+ Prox UA        453        0.83        1.00                    +------------+----------+-------------+----------+----------------+ Mid UA          59        0.69        0.53   competing branch +------------+----------+-------------+----------+----------------+ Dist UA        346        0.70        0.56                    +------------+----------+-------------+----------+----------------+   Summary: Arteriovenous fistula-Velocities less than 100cm/s noted in outflow vein at level of competing branch. *See table(s) above for measurements and observations.  Diagnosing physician: Ruta Hinds MD  Electronically signed by Ruta Hinds MD on 12/16/2019 at 4:05:16 PM.   --------------------------------------------------------------------------------   Final     Scheduled Meds: . Chlorhexidine Gluconate Cloth  6 each Topical Q0600  . ferric citrate  420 mg Oral TID WC  . heparin  5,000 Units Subcutaneous Q8H  . insulin aspart  0-20 Units Subcutaneous TID WC  . lidocaine  15 mL Oral Once  . midodrine  15 mg Oral TID WC  . pantoprazole  40 mg Oral Q0600   Continuous Infusions: . sodium chloride 250 mL (12/27/19 0436)  . sodium chloride 100 mL (01/05/2020 0049)  . sodium chloride    . vancomycin       LOS: 7 days   Time spent: 45 minutes  Little Ishikawa, DO [facility]  12/28/2019, 7:32 AM

## 2020-01-03 NOTE — Progress Notes (Signed)
Pt placed on PPV overnight

## 2020-01-03 NOTE — Anesthesia Preprocedure Evaluation (Signed)
Anesthesia Evaluation  Patient identified by MRN, date of birth, ID band Patient awake    Reviewed: Allergy & Precautions, NPO status , Patient's Chart, lab work & pertinent test results  History of Anesthesia Complications Negative for: history of anesthetic complications  Airway Mallampati: III   Neck ROM: Full    Dental  (+) Dental Advisory Given, Missing   Pulmonary sleep apnea and Continuous Positive Airway Pressure Ventilation ,   Hx tracheostomy 2014 s/p removal     Pulmonary exam normal        Cardiovascular hypertension, Pt. on medications + Past MI  Normal cardiovascular exam   '20 TTE - EF >65%. Mildly increased left ventricular wall thickness. Moderate sclerosis of the aortic valve.    Neuro/Psych PSYCHIATRIC DISORDERS Anxiety negative neurological ROS     GI/Hepatic negative GI ROS, Neg liver ROS,   Endo/Other  diabetes, Type 2, Insulin DependentMorbid obesity Hyponatremia Hypochloremia Hypocalcemia   Renal/GU ESRF and DialysisRenal disease     Musculoskeletal  (+) Arthritis ,   Abdominal (+) + obese,   Peds  Hematology  (+) Blood dyscrasia, anemia ,   Anesthesia Other Findings Bacteremia   Reproductive/Obstetrics                             Anesthesia Physical  Anesthesia Plan  ASA: III  Anesthesia Plan: MAC   Post-op Pain Management:    Induction: Intravenous  PONV Risk Score and Plan: 2 and Treatment may vary due to age or medical condition  Airway Management Planned: Nasal Cannula, Natural Airway, Simple Face Mask and Mask  Additional Equipment: None  Intra-op Plan:   Post-operative Plan: Extubation in OR  Informed Consent: I have reviewed the patients History and Physical, chart, labs and discussed the procedure including the risks, benefits and alternatives for the proposed anesthesia with the patient or authorized representative who has  indicated his/her understanding and acceptance.     Dental advisory given  Plan Discussed with: CRNA, Anesthesiologist and Surgeon  Anesthesia Plan Comments: (Grade 4 view with Mac 4 previous anesthetic, easy intubation with glidescope 3)        Anesthesia Quick Evaluation

## 2020-01-03 NOTE — Progress Notes (Signed)
[name], male   DOB: 01-Feb-1966, 54 y.o.   MRN: [id number]  [facility] Progress Note   Assessment/ Plan:   1. Septic shock with recurrent MRSA bacteremia: Persistent hemodynamic instability overnight (requiring fluid bolus in the setting of ongoing midodrine) with recurrent fever treated with Tylenol.  On intravenous vancomycin awaiting TEE for SBE evaluation. 2. ESRD: Usually on a Monday/Wednesday/Friday schedule as an outpatient and will get hemodialysis today-this is likely to be challenging in the setting of tachycardia and hypotension and I will attempt albumin bolus predialysis.  Hyponatremia reflective of water excess-discussed limiting sodium/interdialytic weight gain. 3. Anemia: Hemoglobin and hematocrit remain low in the setting of recurrent infection/inflammatory complex.  Continue to monitor on ESA. 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 5. Nutrition: With significantly low albumin secondary to negative acute phase reaction-continue oral nutritional supplementation.  Subjective:   With fever and intermittent hypotension noted overnight   Objective:   BP (!) 88/53   Pulse (!) 108   Temp (!) 105 F (40.6 C) (Oral)   Resp (!) 26   Ht 5\' 8"  (1.727 m)   Wt 123.2 kg   SpO2 97%   BMI 41.30 kg/m   Physical Exam: Gen: Appears fatigued resting in bed, on oxygen via nasal cannula CVS: Pulse regular tachycardia, S1 and S2 normal Resp: Anteriorly clear to auscultation, no rales/rhonchi Abd: Soft, obese, nontender Ext: No lower extremity edema, left upper arm AV fistula with present thrill/intact dressings  Labs: BMET Recent Labs  Lab 12/28/19 0818 12/28/19 0818 12/29/19 0751 12/30/19 7681 12/31/19 0428 12/30/2019 0245 01/02/20 0054 01/02/20 1607 12/25/2019 0333  NA 125*   < > 129* 127* 126* 130* 132* 134* 135  K 6.3*   < > 4.7 4.9 5.0 5.3* 5.1 4.0 4.4  CL 87*    < > 90* 88* 87* 91* 93* 95* 96*  CO2 19*   < > 20* 18* 22 22 17* 23 22  GLUCOSE 114*   < > 135* 147* 144* 170* 158* 178* 186*  BUN 81*   < > 53* 79* 102* 56* 27* 49* 61*  CREATININE 10.97*   < > 6.56* 7.47* 8.57* 5.47* 3.36* 5.02* 5.79*  CALCIUM 7.6*   < > 8.7* 8.6* 8.8* 8.8* 9.2 9.3 9.0  PHOS 7.0*  --  6.1* 6.3* 6.4* 5.7* 4.7*  --  6.0*   < > = values in this interval not displayed.   CBC Recent Labs  Lab 12/31/19 0428 12/31/19 0428 12/27/2019 0245 01/02/20 0054 01/02/20 1607 12/23/2019 0333  WBC 29.1*   < > 32.5* 41.6* 35.6* 36.0*  NEUTROABS 25.0*  --  25.1* 33.2*  --  28.8*  HGB 9.2*   < > 9.0* 8.9* 8.6* 8.5*  HCT 26.9*   < > 26.7* 27.0* 26.0* 26.6*  MCV 84.9   < > 85.3 87.9 88.4 88.7  PLT 342   < > 376 373 354 354   < > = values in this interval not displayed.      Medications:    . Chlorhexidine Gluconate Cloth  6 each Topical Q0600  . ferric citrate  420 mg Oral TID WC  . heparin  5,000 Units Subcutaneous Q8H  . insulin aspart  0-20 Units Subcutaneous TID WC  . lidocaine  15 mL Oral Once  . midodrine  15 mg Oral TID WC  . pantoprazole  40 mg Oral Q0600     Ulice Dash  Posey Pronto, MD 12/09/2019, 7:17 AM

## 2020-01-03 NOTE — Progress Notes (Signed)
OT Cancellation Note  Patient Details Name: Seth [name] MRN: [id number] DOB: 06-May-1966   Cancelled Treatment:    Reason Eval/Treat Not Completed: Medical issues which prohibited therapy;Patient at procedure or test/ unavailable.  Patient going to HD.  Nursing also reporting patient is not medically appropriate for therapy today.  High fever, high pain, hypostatic and lethargic.  Will check back once medically ready.  August Luz, OTR/L   Phylliss Bob 01/04/2020, 2:18 PM

## 2020-01-03 NOTE — Progress Notes (Signed)
Pharmacy Antibiotic Note  Seth [name] is a 54 y.o. male admitted on 12/21/2019 with MRSA bacteremia. Pharmacy has been consulted for vancomycin dosing.   Patient is ESRD with regular HD MWF. Last HD was late 3/24 (~ 4 hours at 400 BFR) and next HD planned for today. A vancomycin random level today was 27 pre-HD (Goal level: 15-25). Patient to get TEE to help determine length of therapy for his MRSA bacteremia .  Pre-HD level is slightly high but will continue same dose of vancomycin and monitor pre-HD levels for accumulation.   Plan: Continue vancomycin 1000mg  IV post-HD. Goal pre-HD level 15-25 mcg/mL.  F/U results of TEE, pre-HD level next week, blood culture clearance    Temp (24hrs), Avg:100.7 F (38.2 C), Min:98.9 F (37.2 C), Max:102.3 F (39.1 C)  Recent Labs  Lab 12/28/19 1942 12/29/19 0751 12/31/19 0428 12/12/2019 0245 01/02/20 0054 01/02/20 1607 01/02/20 1704 01/02/20 1815 01/05/2020 0333  WBC  --    < > 29.1* 32.5* 41.6* 35.6*  --   --  36.0*  CREATININE  --    < > 8.57* 5.47* 3.36* 5.02*  --   --  5.79*  LATICACIDVEN  --   --   --   --   --   --  1.9 1.5  --   VANCORANDOM 25  --   --   --   --   --   --   --  27   < > = values in this interval not displayed.    Estimated Creatinine Clearance: 18.4 mL/min (A) (by C-G formula based on SCr of 5.79 mg/dL (H)).    Antimicrobials Vancomycin 3/18>> Pip/tazo 3/19  Microbiology 3/18 Bcx 2/2: MRSA  3/19 MRSA +  3/20 Bcx 2/2 MRSA 3/22 Bcx: 1/2 MRSA 3/23 BCx: 1/2 MRSA 3/25 BCX: NGTD    Jimmy Footman, PharmD, BCPS, [facility] Clinical Pharmacist Phone: [phone] Please check AMION for all [place] phone numbers After 10:00 PM, call [place] [phone]

## 2020-01-03 NOTE — Progress Notes (Signed)
PT Cancellation Note  Patient Details Name: Seth [name] MRN: [id number] DOB: 01/17/1966   Cancelled Treatment:    Reason Eval/Treat Not Completed: Medical issues which prohibited therapy;Patient at procedure or test/unavailable. RN reports pt is going to HD. Also, pt is very fatigued and has been hypostatic. Not medically appropriate for therapy at this time. Will check back later to see if anything has changed.  [name], PT, DPT Acute Rehabilitation Services Office [phone]   [name] 01/04/2020, 9:04 AM

## 2020-01-03 NOTE — Progress Notes (Signed)
Pt BPs have been soft all night with elevated HR and RR. I have been keeping the physician informed of low Bps. During my shift, I gave the pt 2 (100 mL) boluses, but his blood pressures remained soft. His vitals at 4 am were Temp 102.3 axillary, BP: 86/50 with a MAP of 63, HR 109, RR 33. At 415, his BP was 72/52 with a MAP of 60. Rapid response was notified, and they are aware of the situation. Tylenol 650 mg was given rectally. Will continue to monitor.

## 2020-01-03 NOTE — Progress Notes (Signed)
[name], male   DOB: 06/24/1966, 54 y.o.   MRN: [id number]         [facility]  Date of Admission:  01/04/2020             Day 9 vancomycin    ASSESSMENT: He has persistent MRSA bacteremia 3/18-3/23 with 24 hours of no growth from recently blood draw 3/24. Stable Vanc MIC @ 1.0. Suspect that this reflects burden of infection source not failure of antibiotic therapy. He unfortunately refused TEE because he was "tired". 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 He has had 2 back MRIs that have revealed no discitis/vertebral infection. No joints in particular appear infected today - he allowed more manipulation and palpation of joints today.   He has had low blood pressured today (systolic low 09B) and required albumin on HD. He has been intermittently confused/encephalopathic that in my opinion has been getting a bit worse daily. Hard to tell with prolonged bacteremia vs hypotension vs pain medication vs uremia vs CNS process related to presumed endocarditis. If he does not clear mentally may consider MRI of head to look for CNS emboli.   Will continue vancomycin and follow cultures for now.    Dr. Tommy Medal is available this weekend for questions at [phone] - otherwise will monitor remotely and follow up in person Monday.     PLAN: 1. Continue vancomycin 2. Follow up blood cultures for clearance  3. Declined TEE (3rd attempt)    Principal Problem:   MRSA bacteremia Active Problems:   Hyperlipidemia associated with type 2 diabetes mellitus ([facility])   ESRD (end stage renal disease) on dialysis ([facility])   Absolute anemia   DM (diabetes mellitus) type 2, uncontrolled, with ketoacidosis ([name])   Hyperkalemia   Hyponatremia   Septic shock ([name])   Sepsis due to infected intravenous catheter ([name])   Wrist pain, acute, left   Scheduled Meds: . Chlorhexidine Gluconate Cloth  6 each Topical Q0600  .  ferric citrate  420 mg Oral TID WC  . heparin  5,000 Units Subcutaneous Q8H  . insulin aspart  0-20 Units Subcutaneous TID WC  . lidocaine  15 mL Oral Once  . midodrine  15 mg Oral TID WC  . pantoprazole  40 mg Oral Q0600   Continuous Infusions: . sodium chloride 250 mL (12/27/19 0436)  . sodium chloride 100 mL (12/13/2019 0049)  . sodium chloride    . vancomycin     PRN Meds:.sodium chloride, sodium chloride, acetaminophen, acetaminophen, alteplase, heparin, lidocaine (PF), lidocaine-prilocaine, pentafluoroprop-tetrafluoroeth   SUBJECTIVE: He complains of hurting all over.  Review of Systems: Review of Systems  Unable to perform ROS: Mental acuity    No Known Allergies  OBJECTIVE: Vitals:   12/24/2019 1000 12/19/2019 1030 01/02/2020 1100 12/18/2019 1130  BP: (!) 71/49 (!) 82/48 (!) 75/48 (!) 96/53  Pulse: (!) 107 85 95 88  Resp: (!) 33 (!) 27 (!) 26 (!) 34  Temp:      TempSrc:      SpO2: 98% 100% 100% 100%  Weight:      Height:       Body mass index is 41.3 kg/m.  Physical Exam Constitutional:      Comments: He sleeps frequently during most of the conversation. On HD now.   Cardiovascular:     Rate and Rhythm: Normal rate and regular rhythm.     Heart sounds: No murmur.  Comments: Distant heart sounds. Pulmonary:     Effort: Pulmonary effort is normal.     Breath sounds: Normal breath sounds.  Abdominal:     Palpations: Abdomen is soft.     Tenderness: There is no abdominal tenderness.  Musculoskeletal:        General: No swelling or tenderness.     Comments: No significant tenderness around wrists or ankle joints. Some mild tenderness and swelling to R knee.   Skin:    General: Skin is warm and dry.     Capillary Refill: Capillary refill takes less than 2 seconds.     Findings: No rash.  Neurological:     Mental Status: He is disoriented.     Lab Results Lab Results  Component Value Date   WBC 36.0 (H) 12/28/2019   HGB 8.5 (L) 12/12/2019   HCT 26.6  (L) 12/29/2019   MCV 88.7 12/12/2019   PLT 354 01/08/2020    Lab Results  Component Value Date   CREATININE 5.79 (H) 12/16/2019   BUN 61 (H) 01/08/2020   NA 135 01/02/2020   K 4.4 12/31/2019   CL 96 (L) 12/11/2019   CO2 22 12/09/2019    Lab Results  Component Value Date   ALT 30 01/02/2020   AST 87 (H) 01/02/2020   ALKPHOS 256 (H) 01/02/2020   BILITOT 1.3 (H) 01/02/2020     Microbiology: Recent Results (from the past 240 hour(s))  Blood Culture (routine x 2)     Status: Abnormal   Collection Time: 12/28/2019 10:15 PM   Specimen: BLOOD RIGHT ARM  Result Value Ref Range Status   Specimen Description BLOOD RIGHT ARM  Final   Special Requests   Final    BOTTLES DRAWN AEROBIC AND ANAEROBIC Blood Culture results may not be optimal due to an inadequate volume of blood received in culture bottles   Culture  Setup Time   Final    GRAM POSITIVE COCCI IN CLUSTERS IN BOTH AEROBIC AND ANAEROBIC BOTTLES CRITICAL RESULT CALLED TO, READ BACK BY AND VERIFIED WITH: J. Roselie Skinner, AT 1009 12/27/19 DV Performed at [facility], [place] [address], [place], [place] [address]    Culture METHICILLIN RESISTANT STAPHYLOCOCCUS AUREUS (A)  Final   Report Status 12/29/2019 FINAL  Final   Organism ID, Bacteria METHICILLIN RESISTANT STAPHYLOCOCCUS AUREUS  Final      Susceptibility   Methicillin resistant staphylococcus aureus - MIC*    CIPROFLOXACIN <=0.5 SENSITIVE Sensitive     ERYTHROMYCIN >=8 RESISTANT Resistant     GENTAMICIN <=0.5 SENSITIVE Sensitive     OXACILLIN >=4 RESISTANT Resistant     TETRACYCLINE <=1 SENSITIVE Sensitive     VANCOMYCIN 1 SENSITIVE Sensitive     TRIMETH/SULFA <=10 SENSITIVE Sensitive     CLINDAMYCIN <=0.25 SENSITIVE Sensitive     RIFAMPIN <=0.5 SENSITIVE Sensitive     Inducible Clindamycin NEGATIVE Sensitive     * METHICILLIN RESISTANT STAPHYLOCOCCUS AUREUS  Blood Culture (routine x 2)     Status: Abnormal   Collection Time: 12/20/2019 10:41 PM   Specimen: BLOOD  RIGHT HAND  Result Value Ref Range Status   Specimen Description BLOOD RIGHT HAND  Final   Special Requests AEROBIC BOTTLE ONLY Blood Culture adequate volume  Final   Culture  Setup Time   Final    GRAM POSITIVE COCCI IN CLUSTERS AEROBIC BOTTLE ONLY CRITICAL VALUE NOTED.  VALUE IS CONSISTENT WITH PREVIOUSLY REPORTED AND CALLED VALUE.    Culture (A)  Final  STAPHYLOCOCCUS AUREUS SUSCEPTIBILITIES PERFORMED ON PREVIOUS CULTURE WITHIN THE LAST 5 DAYS. Performed at [facility], [place] [address], [place], [place] [address]    Report Status 12/29/2019 FINAL  Final  Blood Culture ID Panel (Reflexed)     Status: Abnormal   Collection Time: 12/09/2019 10:41 PM  Result Value Ref Range Status   Enterococcus species NOT DETECTED NOT DETECTED Final   Listeria monocytogenes NOT DETECTED NOT DETECTED Final   Staphylococcus species DETECTED (A) NOT DETECTED Final    Comment: CRITICAL RESULT CALLED TO, READ BACK BY AND VERIFIED WITH: Andres Shad PharmD 14:30 12/27/19 ([email or web address])    Staphylococcus aureus (BCID) DETECTED (A) NOT DETECTED Final    Comment: Methicillin (oxacillin)-resistant Staphylococcus aureus (MRSA). MRSA is predictably resistant to beta-lactam antibiotics (except ceftaroline). Preferred therapy is vancomycin unless clinically contraindicated. Patient requires contact precautions if  hospitalized. CRITICAL RESULT CALLED TO, READ BACK BY AND VERIFIED WITH: Andres Shad PharmD 14:30 12/27/19 ([email or web address])    Methicillin resistance DETECTED (A) NOT DETECTED Final    Comment: CRITICAL RESULT CALLED TO, READ BACK BY AND VERIFIED WITH: Andres Shad PharmD 14:30 12/27/19 ([email or web address])    Streptococcus species NOT DETECTED NOT DETECTED Final   Streptococcus agalactiae NOT DETECTED NOT DETECTED Final   Streptococcus pneumoniae NOT DETECTED NOT DETECTED Final   Streptococcus pyogenes NOT DETECTED NOT DETECTED Final   Acinetobacter baumannii NOT DETECTED NOT DETECTED Final   Enterobacteriaceae species NOT  DETECTED NOT DETECTED Final   Enterobacter cloacae complex NOT DETECTED NOT DETECTED Final   Escherichia coli NOT DETECTED NOT DETECTED Final   Klebsiella oxytoca NOT DETECTED NOT DETECTED Final   Klebsiella pneumoniae NOT DETECTED NOT DETECTED Final   Proteus species NOT DETECTED NOT DETECTED Final   Serratia marcescens NOT DETECTED NOT DETECTED Final   Haemophilus influenzae NOT DETECTED NOT DETECTED Final   Neisseria meningitidis NOT DETECTED NOT DETECTED Final   Pseudomonas aeruginosa NOT DETECTED NOT DETECTED Final   Candida albicans NOT DETECTED NOT DETECTED Final   Candida glabrata NOT DETECTED NOT DETECTED Final   Candida krusei NOT DETECTED NOT DETECTED Final   Candida parapsilosis NOT DETECTED NOT DETECTED Final   Candida tropicalis NOT DETECTED NOT DETECTED Final    Comment: Performed at [facility], [place]. [address] [place], [place] [address]  Respiratory Panel by RT PCR (Flu A&B, Covid) - Nasopharyngeal Swab     Status: None   Collection Time: 12/28/2019 11:22 PM   Specimen: Nasopharyngeal Swab  Result Value Ref Range Status   SARS Coronavirus 2 by RT PCR NEGATIVE NEGATIVE Final    Comment: (NOTE) SARS-CoV-2 target nucleic acids are NOT DETECTED. The SARS-CoV-2 RNA is generally detectable in upper respiratoy specimens during the acute phase of infection. The lowest concentration of SARS-CoV-2 viral copies this assay can detect is 131 copies/mL. A negative result does not preclude SARS-Cov-2 infection and should not be used as the sole basis for treatment or other patient management decisions. A negative result may occur with  improper specimen collection/handling, submission of specimen other than nasopharyngeal swab, presence of viral mutation(s) within the areas targeted by this assay, and inadequate number of viral copies (<131 copies/mL). A negative result must be combined with clinical observations, patient history, and epidemiological information. The expected  result is Negative. Fact Sheet for Patients:  PinkCheek.be Fact Sheet for Healthcare Providers:  GravelBags.it This test is not yet ap proved or cleared by the [place] FDA and  has been authorized for detection and/or diagnosis  of SARS-CoV-2 by FDA under an Emergency Use Authorization (EUA). This EUA will remain  in effect (meaning this test can be used) for the duration of the COVID-19 declaration under Section 564(b)(1) of the Act, 21 U.S.C. section 360bbb-3(b)(1), unless the authorization is terminated or revoked sooner.    Influenza A by PCR NEGATIVE NEGATIVE Final   Influenza B by PCR NEGATIVE NEGATIVE Final    Comment: (NOTE) The Xpert Xpress SARS-CoV-2/FLU/RSV assay is intended as an aid in  the diagnosis of influenza from Nasopharyngeal swab specimens and  should not be used as a sole basis for treatment. Nasal washings and  aspirates are unacceptable for Xpert Xpress SARS-CoV-2/FLU/RSV  testing. Fact Sheet for Patients: PinkCheek.be Fact Sheet for Healthcare Providers: GravelBags.it This test is not yet approved or cleared by the [place] FDA and  has been authorized for detection and/or diagnosis of SARS-CoV-2 by  FDA under an Emergency Use Authorization (EUA). This EUA will remain  in effect (meaning this test can be used) for the duration of the  Covid-19 declaration under Section 564(b)(1) of the Act, 21  U.S.C. section 360bbb-3(b)(1), unless the authorization is  terminated or revoked. Performed at [facility], [place] [address] [place], [place] [address]   MRSA PCR Screening     Status: Abnormal   Collection Time: 12/27/19  5:30 AM   Specimen: Nasopharyngeal  Result Value Ref Range Status   MRSA by PCR POSITIVE (A) NEGATIVE Final    Comment:        The GeneXpert MRSA Assay (FDA approved for NASAL specimens only), is one component  of a comprehensive MRSA colonization surveillance program. It is not intended to diagnose MRSA infection nor to guide or monitor treatment for MRSA infections. RESULT CALLED TO, READ BACK BY AND VERIFIED WITH: RN Sarajane Jews [phone] FCP Performed at [facility] [address], [place], [place] [address]   Culture, blood (routine x 2)     Status: Abnormal   Collection Time: 12/28/19  5:40 AM   Specimen: BLOOD RIGHT HAND  Result Value Ref Range Status   Specimen Description BLOOD RIGHT HAND  Final   Special Requests   Final    BOTTLES DRAWN AEROBIC AND ANAEROBIC Blood Culture adequate volume   Culture  Setup Time   Final    IN BOTH AEROBIC AND ANAEROBIC BOTTLES GRAM POSITIVE COCCI IN CLUSTERS CRITICAL VALUE NOTED.  VALUE IS CONSISTENT WITH PREVIOUSLY REPORTED AND CALLED VALUE.    Culture (A)  Final    STAPHYLOCOCCUS AUREUS SUSCEPTIBILITIES PERFORMED ON PREVIOUS CULTURE WITHIN THE LAST 5 DAYS. Performed at [facility], [place] [address] [place], [place] [address]    Report Status 01/02/2020 FINAL  Final  Culture, blood (routine x 2)     Status: Abnormal   Collection Time: 12/28/19  5:59 AM   Specimen: BLOOD RIGHT HAND  Result Value Ref Range Status   Specimen Description BLOOD RIGHT HAND  Final   Special Requests IN PEDIATRIC BOTTLE Blood Culture adequate volume  Final   Culture  Setup Time   Final    GRAM POSITIVE COCCI IN PEDIATRIC BOTTLE CRITICAL RESULT CALLED TO, READ BACK BY AND VERIFIED WITH: PHARMD LINDSAY CHEN @2021  12/28/2019 AKT    Culture (A)  Final    STAPHYLOCOCCUS AUREUS SUSCEPTIBILITIES PERFORMED ON PREVIOUS CULTURE WITHIN THE LAST 5 DAYS. Performed at [facility], [place] [address], [place],  [address]    Report Status 12/29/2019 FINAL  Final  Culture, blood (routine x 2)     Status: None (Preliminary result)   Collection Time: 12/30/19  6:35 AM   Specimen: BLOOD  Result Value Ref Range Status   Specimen Description BLOOD RIGHT  ANTECUBITAL  Final   Special Requests   Final    BOTTLES DRAWN AEROBIC AND ANAEROBIC Blood Culture adequate volume   Culture   Final    NO GROWTH 4 DAYS Performed at [facility], [address], [place], [place] [address]    Report Status PENDING  Incomplete  Culture, blood (routine x 2)     Status: Abnormal   Collection Time: 12/30/19  6:40 AM   Specimen: BLOOD RIGHT HAND  Result Value Ref Range Status   Specimen Description BLOOD RIGHT HAND  Final   Special Requests   Final    BOTTLES DRAWN AEROBIC AND ANAEROBIC Blood Culture adequate volume   Culture  Setup Time   Final    GRAM POSITIVE COCCI IN CLUSTERS IN BOTH AEROBIC AND ANAEROBIC BOTTLES CRITICAL VALUE NOTED.  VALUE IS CONSISTENT WITH PREVIOUSLY REPORTED AND CALLED VALUE. Performed at [facility], [place] [address] [place], [place] [address]    Culture METHICILLIN RESISTANT STAPHYLOCOCCUS AUREUS (A)  Final   Report Status 01/02/2020 FINAL  Final   Organism ID, Bacteria METHICILLIN RESISTANT STAPHYLOCOCCUS AUREUS  Final      Susceptibility   Methicillin resistant staphylococcus aureus - MIC*    CIPROFLOXACIN <=0.5 SENSITIVE Sensitive     ERYTHROMYCIN >=8 RESISTANT Resistant     GENTAMICIN <=0.5 SENSITIVE Sensitive     OXACILLIN >=4 RESISTANT Resistant     TETRACYCLINE <=1 SENSITIVE Sensitive     VANCOMYCIN 1 SENSITIVE Sensitive     TRIMETH/SULFA <=10 SENSITIVE Sensitive     CLINDAMYCIN <=0.25 SENSITIVE Sensitive     RIFAMPIN <=0.5 SENSITIVE Sensitive     Inducible Clindamycin NEGATIVE Sensitive     * METHICILLIN RESISTANT STAPHYLOCOCCUS AUREUS  Culture, blood (routine x 2)     Status: None (Preliminary result)   Collection Time: 12/31/19 11:09 AM   Specimen: BLOOD RIGHT HAND  Result Value Ref Range Status   Specimen Description BLOOD RIGHT HAND  Final   Special Requests AEROBIC BOTTLE ONLY Blood Culture adequate volume  Final   Culture   Final    NO GROWTH 3 DAYS Performed at [facility],  [place] [address], [place], [place] [address]    Report Status PENDING  Incomplete  Culture, blood (routine x 2)     Status: Abnormal   Collection Time: 12/31/19 11:23 AM   Specimen: BLOOD RIGHT HAND  Result Value Ref Range Status   Specimen Description BLOOD RIGHT HAND  Final   Special Requests   Final    AEROBIC BOTTLE ONLY Blood Culture results may not be optimal due to an inadequate volume of blood received in culture bottles   Culture  Setup Time   Final    AEROBIC BOTTLE ONLY GRAM POSITIVE COCCI CRITICAL VALUE NOTED.  VALUE IS CONSISTENT WITH PREVIOUSLY REPORTED AND CALLED VALUE.    Culture (A)  Final    STAPHYLOCOCCUS AUREUS SUSCEPTIBILITIES PERFORMED ON PREVIOUS CULTURE WITHIN THE LAST 5 DAYS. Performed at [facility], [place] [address], [place], [place] [address]    Report Status 01/08/2020 FINAL  Final  Culture, blood (Routine X 2) w Reflex to ID Panel     Status: None (Preliminary result)   Collection Time: 01/02/20 12:40 AM  Specimen: BLOOD RIGHT HAND  Result Value Ref Range Status   Specimen Description BLOOD RIGHT HAND  Final   Special Requests AEROBIC BOTTLE ONLY Blood Culture adequate volume  Final   Culture   Final    NO GROWTH 1 DAY Performed at [facility], [address], [place], [place] [address]    Report Status PENDING  Incomplete  Culture, blood (Routine X 2) w Reflex to ID Panel     Status: None (Preliminary result)   Collection Time: 01/02/20 12:50 AM   Specimen: BLOOD RIGHT HAND  Result Value Ref Range Status   Specimen Description BLOOD RIGHT HAND  Final   Special Requests   Final    BOTTLES DRAWN AEROBIC AND ANAEROBIC Blood Culture adequate volume   Culture   Final    NO GROWTH 1 DAY Performed at [facility], [place] [address], [place], [place] [address]    Report Status PENDING  Incomplete    Janene Madeira, [facility] [place] [phone] pager   [phone] cell 12/14/2019,  11:41 AM

## 2020-01-03 NOTE — Procedures (Signed)
Patient seen on Hemodialysis. BP (!) 100/51 (BP Location: Left Wrist)   Pulse (!) 108   Temp 99.6 F (37.6 C) (Oral)   Resp (!) 28   Ht 5\' 8"  (1.727 m)   Wt 123.2 kg   SpO2 99%   BMI 41.30 kg/m   QB 300, UF goal 0.5L Hypotensive at this time--71/49, will give 25gm albumin.   Elmarie Shiley MD [facility]. Office # [phone] Pager # [phone] 10:09 AM

## 2020-01-03 NOTE — Significant Event (Addendum)
Rapid Response Event Note  Overview:  Fever/MEWS  Initial Focused Assessment: Notified of persistent MEWS elevation and recurrent fever. This is a change however recureent change consistent with fever trend. Upon arrival, Seth [name] was lying in the bed, resting, easy to wake. He was able to answer questions easily and required some reorientation.  No SOB or inc WOB. He was previously on CPAP during the night. Now he is on 2L [place]. He is mildly tachypneic most likely due to fever. Tylenol given 0421.   0408-102.3 F ax, 108 SR, 83/53 (64), RR 30 with sats 96% on 4L [place].   Interventions: -No acute interventions by myself  Plan of Care (if not transferred): -Recommend rectal temps if pt cooperates -Notify primary svc of events and for further orders -Continue treating fevers with tylenol and/or cooling blanket if necessary  Event Summary: Call received 0424 Arrived at call 0510 Call ended 0525   Madelynn Done

## 2020-01-03 NOTE — Plan of Care (Signed)

## 2020-01-04 ENCOUNTER — Inpatient Hospital Stay (HOSPITAL_COMMUNITY): Payer: Medicare Other

## 2020-01-04 DIAGNOSIS — M25461 Effusion, right knee: Secondary | ICD-10-CM | POA: Diagnosis not present

## 2020-01-04 DIAGNOSIS — R410 Disorientation, unspecified: Secondary | ICD-10-CM | POA: Diagnosis not present

## 2020-01-04 DIAGNOSIS — R6521 Severe sepsis with septic shock: Secondary | ICD-10-CM | POA: Diagnosis not present

## 2020-01-04 DIAGNOSIS — A419 Sepsis, unspecified organism: Secondary | ICD-10-CM | POA: Diagnosis not present

## 2020-01-04 DIAGNOSIS — R7881 Bacteremia: Secondary | ICD-10-CM | POA: Diagnosis not present

## 2020-01-04 DIAGNOSIS — B9562 Methicillin resistant Staphylococcus aureus infection as the cause of diseases classified elsewhere: Secondary | ICD-10-CM | POA: Diagnosis not present

## 2020-01-04 LAB — CBC WITH DIFFERENTIAL/PLATELET
Abs Immature Granulocytes: 1.73 10*3/uL — ABNORMAL HIGH (ref 0.00–0.07)
Basophils Absolute: 0.2 10*3/uL — ABNORMAL HIGH (ref 0.0–0.1)
Basophils Relative: 1 %
Eosinophils Absolute: 0.5 10*3/uL (ref 0.0–0.5)
Eosinophils Relative: 2 %
HCT: 24.7 % — ABNORMAL LOW (ref 39.0–52.0)
Hemoglobin: 7.6 g/dL — ABNORMAL LOW (ref 13.0–17.0)
Immature Granulocytes: 6 %
Lymphocytes Relative: 6 %
Lymphs Abs: 1.8 10*3/uL (ref 0.7–4.0)
MCH: 28.1 pg (ref 26.0–34.0)
MCHC: 30.8 g/dL (ref 30.0–36.0)
MCV: 91.5 fL (ref 80.0–100.0)
Monocytes Absolute: 1.4 10*3/uL — ABNORMAL HIGH (ref 0.1–1.0)
Monocytes Relative: 5 %
Neutro Abs: 24 10*3/uL — ABNORMAL HIGH (ref 1.7–7.7)
Neutrophils Relative %: 80 %
Platelets: 328 10*3/uL (ref 150–400)
RBC: 2.7 MIL/uL — ABNORMAL LOW (ref 4.22–5.81)
RDW: 18 % — ABNORMAL HIGH (ref 11.5–15.5)
WBC: 29.7 10*3/uL — ABNORMAL HIGH (ref 4.0–10.5)
nRBC: 1.1 % — ABNORMAL HIGH (ref 0.0–0.2)

## 2020-01-04 LAB — GLUCOSE, CAPILLARY
Glucose-Capillary: 230 mg/dL — ABNORMAL HIGH (ref 70–99)
Glucose-Capillary: 218 mg/dL — ABNORMAL HIGH (ref 70–99)
Glucose-Capillary: 235 mg/dL — ABNORMAL HIGH (ref 70–99)
Glucose-Capillary: 207 mg/dL — ABNORMAL HIGH (ref 70–99)

## 2020-01-04 LAB — CBC
HCT: 23.8 % — ABNORMAL LOW (ref 39.0–52.0)
Hemoglobin: 7.6 g/dL — ABNORMAL LOW (ref 13.0–17.0)
MCH: 29 pg (ref 26.0–34.0)
MCHC: 31.9 g/dL (ref 30.0–36.0)
MCV: 90.8 fL (ref 80.0–100.0)
Platelets: 350 10*3/uL (ref 150–400)
RBC: 2.62 MIL/uL — ABNORMAL LOW (ref 4.22–5.81)
RDW: 18.8 % — ABNORMAL HIGH (ref 11.5–15.5)
WBC: 29.9 10*3/uL — ABNORMAL HIGH (ref 4.0–10.5)
nRBC: 1.4 % — ABNORMAL HIGH (ref 0.0–0.2)

## 2020-01-04 LAB — RENAL FUNCTION PANEL
Albumin: 1.7 g/dL — ABNORMAL LOW (ref 3.5–5.0)
Anion gap: 19 — ABNORMAL HIGH (ref 5–15)
BUN: 36 mg/dL — ABNORMAL HIGH (ref 6–20)
CO2: 22 mmol/L (ref 22–32)
Calcium: 9 mg/dL (ref 8.9–10.3)
Chloride: 93 mmol/L — ABNORMAL LOW (ref 98–111)
Creatinine, Ser: 4.24 mg/dL — ABNORMAL HIGH (ref 0.61–1.24)
GFR calc Af Amer: 17 mL/min — ABNORMAL LOW (ref >60)
GFR calc non Af Amer: 15 mL/min — ABNORMAL LOW (ref >60)
Glucose, Bld: 207 mg/dL — ABNORMAL HIGH (ref 70–99)
Phosphorus: 4.7 mg/dL — ABNORMAL HIGH (ref 2.5–4.6)
Potassium: 4.1 mmol/L (ref 3.5–5.1)
Sodium: 134 mmol/L — ABNORMAL LOW (ref 135–145)

## 2020-01-04 LAB — BLOOD GAS, ARTERIAL
Acid-Base Excess: 0.7 mmol/L (ref 0.0–2.0)
Bicarbonate: 26.1 mmol/L (ref 20.0–28.0)
Drawn by: 3468
FIO2: 60
O2 Saturation: 75.2 %
Patient temperature: 39.1
pCO2 arterial: 57.8 mmHg — ABNORMAL HIGH (ref 32.0–48.0)
pH, Arterial: 7.29 — ABNORMAL LOW (ref 7.350–7.450)
pO2, Arterial: 52.7 mmHg — ABNORMAL LOW (ref 83.0–108.0)

## 2020-01-04 LAB — CULTURE, BLOOD (ROUTINE X 2)
Special Requests: ADEQUATE
Special Requests: ADEQUATE

## 2020-01-04 MED ORDER — HYDROCORTISONE NA SUCCINATE PF 100 MG IJ SOLR
100.0000 mg | Freq: Once | INTRAMUSCULAR | Status: AC
  Administered 2020-01-05: 04:00:00 100 mg via INTRAVENOUS
  Filled 2020-01-04: qty 2

## 2020-01-04 MED ORDER — SODIUM CHLORIDE 0.9 % IV BOLUS: 250.0000 mL | Freq: Once | INTRAVENOUS | Status: DC

## 2020-01-04 MED ORDER — NOREPINEPHRINE 4 MG/250ML-% IV SOLN
INTRAVENOUS | Status: AC
  Administered 2020-01-04: 2 ug/min
  Filled 2020-01-04: qty 250

## 2020-01-04 MED ORDER — FENTANYL CITRATE (PF) 100 MCG/2ML IJ SOLN
INTRAMUSCULAR | Status: AC
  Administered 2020-01-04: 22:00:00 25 ug
  Filled 2020-01-04: qty 2

## 2020-01-04 MED ORDER — FENTANYL CITRATE (PF) 100 MCG/2ML IJ SOLN
INTRAMUSCULAR | Status: AC
  Filled 2020-01-04: qty 2

## 2020-01-04 MED ORDER — NOREPINEPHRINE 16 MG/250ML-% IV SOLN
0.0000 ug/min | INTRAVENOUS | Status: DC
  Administered 2020-01-04: 4 ug/min via INTRAVENOUS
  Filled 2020-01-04 (×3): qty 250

## 2020-01-04 MED ORDER — SODIUM CHLORIDE 0.9 % IV SOLN
2.0000 g | INTRAVENOUS | Status: DC
  Administered 2020-01-05 (×2): 2 g via INTRAVENOUS
  Filled 2020-01-04 (×3): qty 2

## 2020-01-04 MED ORDER — VASOPRESSIN 20 UNIT/ML IV SOLN
0.0300 [IU]/min | INTRAVENOUS | Status: DC
  Administered 2020-01-04 – 2020-01-05 (×2): 0.03 [IU]/min via INTRAVENOUS
  Filled 2020-01-04 (×2): qty 2

## 2020-01-04 MED ORDER — HYDROCORTISONE NA SUCCINATE PF 100 MG IJ SOLR
50.0000 mg | Freq: Four times a day (QID) | INTRAMUSCULAR | Status: DC
  Administered 2020-01-05 – 2020-01-06 (×5): 50 mg via INTRAVENOUS
  Filled 2020-01-04 (×5): qty 2

## 2020-01-04 MED ORDER — PROPOFOL 1000 MG/100ML IV EMUL
0.0000 ug/kg/min | INTRAVENOUS | Status: AC
  Administered 2020-01-04: 5 ug/kg/min via INTRAVENOUS
  Administered 2020-01-05: 24.215 ug/kg/min via INTRAVENOUS
  Administered 2020-01-05: 35 ug/kg/min via INTRAVENOUS
  Administered 2020-01-05 – 2020-01-06 (×2): 25 ug/kg/min via INTRAVENOUS
  Administered 2020-01-06: 35 ug/kg/min via INTRAVENOUS
  Filled 2020-01-04 (×6): qty 100

## 2020-01-04 MED ORDER — SODIUM BICARBONATE 8.4 % IV SOLN
INTRAVENOUS | Status: AC
  Administered 2020-01-04: 50 meq
  Filled 2020-01-04: qty 50

## 2020-01-04 MED ORDER — FENTANYL CITRATE (PF) 100 MCG/2ML IJ SOLN
50.0000 ug | INTRAMUSCULAR | Status: DC | PRN
  Administered 2020-01-05 – 2020-01-06 (×2): 100 ug via INTRAVENOUS
  Administered 2020-01-06 (×3): 200 ug via INTRAVENOUS
  Filled 2020-01-04 (×3): qty 4
  Filled 2020-01-04 (×2): qty 2

## 2020-01-04 MED ORDER — FENTANYL CITRATE (PF) 100 MCG/2ML IJ SOLN: 50.0000 ug | INTRAMUSCULAR | Status: DC | PRN

## 2020-01-04 MED ORDER — PROPOFOL 1000 MG/100ML IV EMUL
INTRAVENOUS | Status: AC
  Filled 2020-01-04: qty 100

## 2020-01-04 NOTE — Significant Event (Signed)
Rapid Response Event Note  Overview: Neurologic - Decreased LOC  Initial Focused Assessment: Called at 1839 to assess patient for decreased LOC coupled with increased RR into the 40s. I am not sure when this change in mental status occurred but upon my arrival, patient was only responsive to pain, I was able to illicit a gag, he had thick yellow oral secretions on the back of this mouth and he was not able to clear, lung sounds - rhonchi throughout, very warm to touch. Once I was able to wake the patient, he did moan and briefly squeezed my hand and then fell back asleep. I immediately had the doctor paged as well. Patient was on CPAP 15L - saturations were 99% on that but RR was 32--35. VS: HR 90s, I was unable to check a BP because the Lab tech was sticking the patient for blood. I spoke with the MD, MD ordered VBG, and based upon that PCCM could be consulted. I was called away to an emergency ay 1902. Night RRRN saw the patient as well and also had to respond to another emergency.   Interventions: -- VBG - unable to obtain ABG.  Plan of Care:  -- TRH MD ordered VBG - will see results and consult PCCM -- PCCM consulted - awaiting disposition -- Currently patient is not acute distress - AMS and decreased LOC have been ongoing per MD and staff. High aspiration risk currently so my clinical recommendation is that the patient not be placed on BIPAP while on PCU and be moved to the ICU for further interventions. Perhaps will need intubation if CO2 is elevated because he has thick yellow secretions that he cannot clear.  -- Keep suction at bedside, HOB > 30 -- Monitor VS and neurologic status closely.  Event Summary:  Call Time [place] -- Called to Code [employer], [place] Taves R

## 2020-01-04 NOTE — Progress Notes (Signed)
eLink Physician-Brief Progress Note Patient Name: Seth [name] DOB: Mar 05, 1966 MRN: [id number]   Date of Service  01/04/2020  HPI/Events of Note  Dialysis patient admitted with concern for catheter infection, bacteremia and septic shock.  eICU Interventions  New patient evaluation completed. Will give another 250 ml fluid bolus and start Norepinephrine and Vasopressin  Targeting a MAP of 65 mmHg.        [name] 01/04/2020, 10:22 PM

## 2020-01-04 NOTE — Progress Notes (Signed)
Subjective: Patient's eyes were open but he did not respond to any of my questions   Antibiotics:  Anti-infectives (From admission, onward)   Start     Dose/Rate Route Frequency Ordered Stop   12/20/2019 1200  vancomycin (VANCOCIN) IVPB 1000 mg/200 mL premix     1,000 mg 200 mL/hr over 60 Minutes Intravenous Every M-W-F (Hemodialysis) 01/02/20 0945 01/22/20 1159   12/10/2019 2200  vancomycin (VANCOCIN) IVPB 1000 mg/200 mL premix     1,000 mg 200 mL/hr over 60 Minutes Intravenous Every M-W-F (Hemodialysis) 01/06/2020 2020 12/11/2019 2308   12/24/2019 1600  vancomycin (VANCOCIN) IVPB 1000 mg/200 mL premix  Status:  Discontinued     1,000 mg 200 mL/hr over 60 Minutes Intravenous Every M-W-F (Hemodialysis) 12/16/2019 1024 12/24/2019 2021   12/31/19 1600  vancomycin (VANCOCIN) IVPB 1000 mg/200 mL premix     1,000 mg 200 mL/hr over 60 Minutes Intravenous Every T-Th-Sa (Hemodialysis) 12/31/19 0958 12/31/19 1549   12/31/19 1309  vancomycin variable dose per unstable renal function (pharmacist dosing)  Status:  Discontinued      Does not apply See admin instructions 12/31/19 1310 01/02/20 0945   12/30/19 1728  vancomycin variable dose per unstable renal function (pharmacist dosing)  Status:  Discontinued      Does not apply See admin instructions 12/30/19 1728 12/31/19 0958   12/27/19 1200  vancomycin (VANCOCIN) IVPB 1000 mg/200 mL premix  Status:  Discontinued     1,000 mg 200 mL/hr over 60 Minutes Intravenous Every M-W-F (Hemodialysis) 12/27/19 0242 12/30/19 1728   12/27/19 1000  piperacillin-tazobactam (ZOSYN) IVPB 2.25 g  Status:  Discontinued     2.25 g 100 mL/hr over 30 Minutes Intravenous Every 8 hours 12/27/19 0242 12/27/19 1432   12/27/19 0030  piperacillin-tazobactam (ZOSYN) IVPB 3.375 g     3.375 g 100 mL/hr over 30 Minutes Intravenous  Once 12/27/19 0027 12/27/19 0209   12/22/2019 2245  vancomycin (VANCOCIN) 2,500 mg in sodium chloride 0.9 % 500 mL IVPB     2,500 mg 250 mL/hr over  120 Minutes Intravenous  Once 12/23/2019 2240 12/27/19 0124      Medications: Scheduled Meds: . Chlorhexidine Gluconate Cloth  6 each Topical Q0600  . ferric citrate  420 mg Oral TID WC  . heparin  5,000 Units Subcutaneous Q8H  . insulin aspart  0-20 Units Subcutaneous TID WC  . lidocaine  15 mL Oral Once  . midodrine  15 mg Oral TID WC  . pantoprazole  40 mg Oral Q0600   Continuous Infusions: . sodium chloride 250 mL (12/27/19 0436)  . sodium chloride 100 mL (12/15/2019 0049)  . sodium chloride    . vancomycin Stopped (12/15/2019 1425)   PRN Meds:.sodium chloride, sodium chloride, acetaminophen, acetaminophen, alteplase, heparin, lidocaine (PF), lidocaine-prilocaine, pentafluoroprop-tetrafluoroeth    Objective: Weight change: 0 kg  Intake/Output Summary (Last 24 hours) at 01/04/2020 1040 Last data filed at 01/04/2020 0800 Gross per 24 hour  Intake 240 ml  Output -329 ml  Net 569 ml   Blood pressure 103/82, pulse 94, temperature 99.4 F (37.4 C), temperature source Axillary, resp. rate (!) 22, height 5\' 8"  (1.727 m), weight 123.2 kg, SpO2 92 %. Temp:  [98 F (36.7 C)-99.4 F (37.4 C)] 99.4 F (37.4 C) (03/27 0726) Pulse Rate:  [88-107] 94 (03/27 0726) Resp:  [20-38] 22 (03/27 0726) BP: (75-113)/(48-82) 103/82 (03/27 0726) SpO2:  [92 %-100 %] 92 % (03/27 0726)  Physical Exam: General: Awake but  staring into space and does not respond to his name or any questions   HEENT: anicteric sclera, EOMI CVS tachycardic no murmurs gallops rubs heard Chest: , Tachypneic with coarse breath sounds Abdomen: soft non-distended,  Extremities: Right knee has an effusion and is quite tender to palpation  skin: Fistula Neuro: nonfocal  CBC:    BMET Recent Labs    12/11/2019 0333 01/04/20 0328  NA 135 134*  K 4.4 4.1  CL 96* 93*  CO2 22 22  GLUCOSE 186* 207*  BUN 61* 36*  CREATININE 5.79* 4.24*  CALCIUM 9.0 9.0     Liver Panel  Recent Labs    01/02/20 1607  01/02/20 1607 12/16/2019 0333 01/04/20 0328  PROT 7.7  --   --   --   ALBUMIN 1.7*   < > 1.7* 1.7*  AST 87*  --   --   --   ALT 30  --   --   --   ALKPHOS 256*  --   --   --   BILITOT 1.3*  --   --   --    < > = values in this interval not displayed.       Sedimentation Rate No results for input(s): ESRSEDRATE in the last 72 hours. C-Reactive Protein No results for input(s): CRP in the last 72 hours.  Micro Results: Recent Results (from the past 720 hour(s))  SARS CORONAVIRUS 2 (TAT 6-24 HRS) Nasopharyngeal Nasopharyngeal Swab     Status: None   Collection Time: 12/22/19 11:45 PM   Specimen: Nasopharyngeal Swab  Result Value Ref Range Status   SARS Coronavirus 2 NEGATIVE NEGATIVE Final    Comment: (NOTE) SARS-CoV-2 target nucleic acids are NOT DETECTED. The SARS-CoV-2 RNA is generally detectable in upper and lower respiratory specimens during the acute phase of infection. Negative results do not preclude SARS-CoV-2 infection, do not rule out co-infections with other pathogens, and should not be used as the sole basis for treatment or other patient management decisions. Negative results must be combined with clinical observations, patient history, and epidemiological information. The expected result is Negative. Fact Sheet for Patients: SugarRoll.be Fact Sheet for Healthcare Providers: https://www.woods-mathews.com/ This test is not yet approved or cleared by the [place] FDA and  has been authorized for detection and/or diagnosis of SARS-CoV-2 by FDA under an Emergency Use Authorization (EUA). This EUA will remain  in effect (meaning this test can be used) for the duration of the COVID-19 declaration under Section 56 4(b)(1) of the Act, 21 U.S.C. section 360bbb-3(b)(1), unless the authorization is terminated or revoked sooner. Performed at [facility], [place] [address], [place], [place] [address]   Blood Culture  (routine x 2)     Status: Abnormal   Collection Time: 12/14/2019 10:15 PM   Specimen: BLOOD RIGHT ARM  Result Value Ref Range Status   Specimen Description BLOOD RIGHT ARM  Final   Special Requests   Final    BOTTLES DRAWN AEROBIC AND ANAEROBIC Blood Culture results may not be optimal due to an inadequate volume of blood received in culture bottles   Culture  Setup Time   Final    GRAM POSITIVE COCCI IN CLUSTERS IN BOTH AEROBIC AND ANAEROBIC BOTTLES CRITICAL RESULT CALLED TO, READ BACK BY AND VERIFIED WITH: J. Roselie Skinner, AT 1009 12/27/19 DV Performed at [facility], [place] [address] [place], [place] [address]    Culture METHICILLIN RESISTANT STAPHYLOCOCCUS AUREUS (A)  Final   Report  Status 12/29/2019 FINAL  Final   Organism ID, Bacteria METHICILLIN RESISTANT STAPHYLOCOCCUS AUREUS  Final      Susceptibility   Methicillin resistant staphylococcus aureus - MIC*    CIPROFLOXACIN <=0.5 SENSITIVE Sensitive     ERYTHROMYCIN >=8 RESISTANT Resistant     GENTAMICIN <=0.5 SENSITIVE Sensitive     OXACILLIN >=4 RESISTANT Resistant     TETRACYCLINE <=1 SENSITIVE Sensitive     VANCOMYCIN 1 SENSITIVE Sensitive     TRIMETH/SULFA <=10 SENSITIVE Sensitive     CLINDAMYCIN <=0.25 SENSITIVE Sensitive     RIFAMPIN <=0.5 SENSITIVE Sensitive     Inducible Clindamycin NEGATIVE Sensitive     * METHICILLIN RESISTANT STAPHYLOCOCCUS AUREUS  Blood Culture (routine x 2)     Status: Abnormal   Collection Time: 01/08/2020 10:41 PM   Specimen: BLOOD RIGHT HAND  Result Value Ref Range Status   Specimen Description BLOOD RIGHT HAND  Final   Special Requests AEROBIC BOTTLE ONLY Blood Culture adequate volume  Final   Culture  Setup Time   Final    GRAM POSITIVE COCCI IN CLUSTERS AEROBIC BOTTLE ONLY CRITICAL VALUE NOTED.  VALUE IS CONSISTENT WITH PREVIOUSLY REPORTED AND CALLED VALUE.    Culture (A)  Final    STAPHYLOCOCCUS AUREUS SUSCEPTIBILITIES PERFORMED ON PREVIOUS CULTURE WITHIN THE LAST 5 DAYS. Performed  at [facility], [place] [address], [place], [place] [address]    Report Status 12/29/2019 FINAL  Final  Blood Culture ID Panel (Reflexed)     Status: Abnormal   Collection Time: 12/19/2019 10:41 PM  Result Value Ref Range Status   Enterococcus species NOT DETECTED NOT DETECTED Final   Listeria monocytogenes NOT DETECTED NOT DETECTED Final   Staphylococcus species DETECTED (A) NOT DETECTED Final    Comment: CRITICAL RESULT CALLED TO, READ BACK BY AND VERIFIED WITH: Andres Shad PharmD 14:30 12/27/19 ([email or web address])    Staphylococcus aureus (BCID) DETECTED (A) NOT DETECTED Final    Comment: Methicillin (oxacillin)-resistant Staphylococcus aureus (MRSA). MRSA is predictably resistant to beta-lactam antibiotics (except ceftaroline). Preferred therapy is vancomycin unless clinically contraindicated. Patient requires contact precautions if  hospitalized. CRITICAL RESULT CALLED TO, READ BACK BY AND VERIFIED WITH: Andres Shad PharmD 14:30 12/27/19 ([email or web address])    Methicillin resistance DETECTED (A) NOT DETECTED Final    Comment: CRITICAL RESULT CALLED TO, READ BACK BY AND VERIFIED WITH: Andres Shad PharmD 14:30 12/27/19 ([email or web address])    Streptococcus species NOT DETECTED NOT DETECTED Final   Streptococcus agalactiae NOT DETECTED NOT DETECTED Final   Streptococcus pneumoniae NOT DETECTED NOT DETECTED Final   Streptococcus pyogenes NOT DETECTED NOT DETECTED Final   Acinetobacter baumannii NOT DETECTED NOT DETECTED Final   Enterobacteriaceae species NOT DETECTED NOT DETECTED Final   Enterobacter cloacae complex NOT DETECTED NOT DETECTED Final   Escherichia coli NOT DETECTED NOT DETECTED Final   Klebsiella oxytoca NOT DETECTED NOT DETECTED Final   Klebsiella pneumoniae NOT DETECTED NOT DETECTED Final   Proteus species NOT DETECTED NOT DETECTED Final   Serratia marcescens NOT DETECTED NOT DETECTED Final   Haemophilus influenzae NOT DETECTED NOT DETECTED Final   Neisseria meningitidis NOT DETECTED NOT DETECTED Final    Pseudomonas aeruginosa NOT DETECTED NOT DETECTED Final   Candida albicans NOT DETECTED NOT DETECTED Final   Candida glabrata NOT DETECTED NOT DETECTED Final   Candida krusei NOT DETECTED NOT DETECTED Final   Candida parapsilosis NOT DETECTED NOT DETECTED Final   Candida tropicalis NOT DETECTED NOT DETECTED Final    Comment: Performed at [facility]  [facility], [place] [address], [place], [place] [address]  Respiratory Panel by RT PCR (Flu A&B, Covid) - Nasopharyngeal Swab     Status: None   Collection Time: 12/25/2019 11:22 PM   Specimen: Nasopharyngeal Swab  Result Value Ref Range Status   SARS Coronavirus 2 by RT PCR NEGATIVE NEGATIVE Final    Comment: (NOTE) SARS-CoV-2 target nucleic acids are NOT DETECTED. The SARS-CoV-2 RNA is generally detectable in upper respiratoy specimens during the acute phase of infection. The lowest concentration of SARS-CoV-2 viral copies this assay can detect is 131 copies/mL. A negative result does not preclude SARS-Cov-2 infection and should not be used as the sole basis for treatment or other patient management decisions. A negative result may occur with  improper specimen collection/handling, submission of specimen other than nasopharyngeal swab, presence of viral mutation(s) within the areas targeted by this assay, and inadequate number of viral copies (<131 copies/mL). A negative result must be combined with clinical observations, patient history, and epidemiological information. The expected result is Negative. Fact Sheet for Patients:  PinkCheek.be Fact Sheet for Healthcare Providers:  GravelBags.it This test is not yet ap proved or cleared by the [place] FDA and  has been authorized for detection and/or diagnosis of SARS-CoV-2 by FDA under an Emergency Use Authorization (EUA). This EUA will remain  in effect (meaning this test can be used) for the duration of the COVID-19 declaration under  Section 564(b)(1) of the Act, 21 U.S.C. section 360bbb-3(b)(1), unless the authorization is terminated or revoked sooner.    Influenza A by PCR NEGATIVE NEGATIVE Final   Influenza B by PCR NEGATIVE NEGATIVE Final    Comment: (NOTE) The Xpert Xpress SARS-CoV-2/FLU/RSV assay is intended as an aid in  the diagnosis of influenza from Nasopharyngeal swab specimens and  should not be used as a sole basis for treatment. Nasal washings and  aspirates are unacceptable for Xpert Xpress SARS-CoV-2/FLU/RSV  testing. Fact Sheet for Patients: PinkCheek.be Fact Sheet for Healthcare Providers: GravelBags.it This test is not yet approved or cleared by the [place] FDA and  has been authorized for detection and/or diagnosis of SARS-CoV-2 by  FDA under an Emergency Use Authorization (EUA). This EUA will remain  in effect (meaning this test can be used) for the duration of the  Covid-19 declaration under Section 564(b)(1) of the Act, 21  U.S.C. section 360bbb-3(b)(1), unless the authorization is  terminated or revoked. Performed at [facility], [place] [address] [place], [place] [address]   MRSA PCR Screening     Status: Abnormal   Collection Time: 12/27/19  5:30 AM   Specimen: Nasopharyngeal  Result Value Ref Range Status   MRSA by PCR POSITIVE (A) NEGATIVE Final    Comment:        The GeneXpert MRSA Assay (FDA approved for NASAL specimens only), is one component of a comprehensive MRSA colonization surveillance program. It is not intended to diagnose MRSA infection nor to guide or monitor treatment for MRSA infections. RESULT CALLED TO, READ BACK BY AND VERIFIED WITH: RN Sarajane Jews [id number] FCP Performed at [place] [address], [place], [place] [address]   Culture, blood (routine x 2)     Status: Abnormal   Collection Time: 12/28/19  5:40 AM   Specimen: BLOOD RIGHT HAND  Result Value Ref Range Status    Specimen Description BLOOD RIGHT HAND  Final   Special Requests   Final    BOTTLES DRAWN AEROBIC AND  ANAEROBIC Blood Culture adequate volume   Culture  Setup Time   Final    IN BOTH AEROBIC AND ANAEROBIC BOTTLES GRAM POSITIVE COCCI IN CLUSTERS CRITICAL VALUE NOTED.  VALUE IS CONSISTENT WITH PREVIOUSLY REPORTED AND CALLED VALUE.    Culture (A)  Final    STAPHYLOCOCCUS AUREUS SUSCEPTIBILITIES PERFORMED ON PREVIOUS CULTURE WITHIN THE LAST 5 DAYS. Performed at [facility], [place] [address], [place], [place] [address]    Report Status 01/02/2020 FINAL  Final  Culture, blood (routine x 2)     Status: Abnormal   Collection Time: 12/28/19  5:59 AM   Specimen: BLOOD RIGHT HAND  Result Value Ref Range Status   Specimen Description BLOOD RIGHT HAND  Final   Special Requests IN PEDIATRIC BOTTLE Blood Culture adequate volume  Final   Culture  Setup Time   Final    GRAM POSITIVE COCCI IN PEDIATRIC BOTTLE CRITICAL RESULT CALLED TO, READ BACK BY AND VERIFIED WITH: PHARMD LINDSAY CHEN @2021  12/28/2019 AKT    Culture (A)  Final    STAPHYLOCOCCUS AUREUS SUSCEPTIBILITIES PERFORMED ON PREVIOUS CULTURE WITHIN THE LAST 5 DAYS. Performed at [facility], [place] [address] [place], [place] [address]    Report Status 12/29/2019 FINAL  Final  Culture, blood (routine x 2)     Status: Abnormal   Collection Time: 12/30/19  6:35 AM   Specimen: BLOOD  Result Value Ref Range Status   Specimen Description BLOOD RIGHT ANTECUBITAL  Final   Special Requests   Final    BOTTLES DRAWN AEROBIC AND ANAEROBIC Blood Culture adequate volume   Culture  Setup Time   Final    GRAM POSITIVE COCCI AEROBIC BOTTLE ONLY CRITICAL VALUE NOTED.  VALUE IS CONSISTENT WITH PREVIOUSLY REPORTED AND CALLED VALUE.    Culture (A)  Final    STAPHYLOCOCCUS AUREUS SUSCEPTIBILITIES PERFORMED ON PREVIOUS CULTURE WITHIN THE LAST 5 DAYS. Performed at [facility], [place] [address], [place], [place] [address]    Report Status  01/04/2020 FINAL  Final  Culture, blood (routine x 2)     Status: Abnormal   Collection Time: 12/30/19  6:40 AM   Specimen: BLOOD RIGHT HAND  Result Value Ref Range Status   Specimen Description BLOOD RIGHT HAND  Final   Special Requests   Final    BOTTLES DRAWN AEROBIC AND ANAEROBIC Blood Culture adequate volume   Culture  Setup Time   Final    GRAM POSITIVE COCCI IN CLUSTERS IN BOTH AEROBIC AND ANAEROBIC BOTTLES CRITICAL VALUE NOTED.  VALUE IS CONSISTENT WITH PREVIOUSLY REPORTED AND CALLED VALUE.    Culture METHICILLIN RESISTANT STAPHYLOCOCCUS AUREUS (A)  Final   Report Status 01/02/2020 FINAL  Final   Organism ID, Bacteria METHICILLIN RESISTANT STAPHYLOCOCCUS AUREUS  Final      Susceptibility   Methicillin resistant staphylococcus aureus - MIC*    CIPROFLOXACIN <=0.5 SENSITIVE Sensitive     ERYTHROMYCIN >=8 RESISTANT Resistant     GENTAMICIN <=0.5 SENSITIVE Sensitive     OXACILLIN >=4 RESISTANT Resistant     TETRACYCLINE <=1 SENSITIVE Sensitive     VANCOMYCIN 1 SENSITIVE Sensitive     TRIMETH/SULFA <=10 SENSITIVE Sensitive     CLINDAMYCIN <=0.25 SENSITIVE Sensitive     RIFAMPIN <=0.5 SENSITIVE Sensitive     Inducible Clindamycin NEGATIVE Sensitive     * METHICILLIN RESISTANT STAPHYLOCOCCUS AUREUS  Culture, blood (routine x 2)     Status: None (Preliminary result)   Collection Time: 12/31/19 11:09 AM  Specimen: BLOOD RIGHT HAND  Result Value Ref Range Status   Specimen Description BLOOD RIGHT HAND  Final   Special Requests AEROBIC BOTTLE ONLY Blood Culture adequate volume  Final   Culture   Final    NO GROWTH 3 DAYS Performed at [facility], [address], [place], [place] [address]    Report Status PENDING  Incomplete  Culture, blood (routine x 2)     Status: Abnormal   Collection Time: 12/31/19 11:23 AM   Specimen: BLOOD RIGHT HAND  Result Value Ref Range Status   Specimen Description BLOOD RIGHT HAND  Final   Special Requests   Final    AEROBIC BOTTLE ONLY  Blood Culture results may not be optimal due to an inadequate volume of blood received in culture bottles   Culture  Setup Time   Final    AEROBIC BOTTLE ONLY GRAM POSITIVE COCCI CRITICAL VALUE NOTED.  VALUE IS CONSISTENT WITH PREVIOUSLY REPORTED AND CALLED VALUE.    Culture (A)  Final    STAPHYLOCOCCUS AUREUS SUSCEPTIBILITIES PERFORMED ON PREVIOUS CULTURE WITHIN THE LAST 5 DAYS. Performed at [facility], [place] [address] [place], [place] [address]    Report Status 12/12/2019 FINAL  Final  Culture, blood (Routine X 2) w Reflex to ID Panel     Status: None (Preliminary result)   Collection Time: 01/02/20 12:40 AM   Specimen: BLOOD RIGHT HAND  Result Value Ref Range Status   Specimen Description BLOOD RIGHT HAND  Final   Special Requests AEROBIC BOTTLE ONLY Blood Culture adequate volume  Final   Culture   Final    NO GROWTH 1 DAY Performed at [facility], [place] [address], [place], [place] [address]    Report Status PENDING  Incomplete  Culture, blood (Routine X 2) w Reflex to ID Panel     Status: None (Preliminary result)   Collection Time: 01/02/20 12:50 AM   Specimen: BLOOD RIGHT HAND  Result Value Ref Range Status   Specimen Description BLOOD RIGHT HAND  Final   Special Requests   Final    BOTTLES DRAWN AEROBIC AND ANAEROBIC Blood Culture adequate volume   Culture   Final    NO GROWTH 1 DAY Performed at [facility], [place] [address] [place], [place] [address]    Report Status PENDING  Incomplete    Studies/Results: No results found.    Assessment/Plan:  INTERVAL HISTORY:   Appears confused and tachypneic this morning   Principal Problem:   MRSA bacteremia Active Problems:   Hyperlipidemia associated with type 2 diabetes mellitus ([name])   ESRD (end stage renal disease) on dialysis ([name])   Absolute anemia   DM (diabetes mellitus) type 2, uncontrolled, with ketoacidosis ([name])   Hyperkalemia   Hyponatremia   Septic shock ([name])   Sepsis due to  infected intravenous catheter ([name])   Wrist pain, acute, left    Seth [name] is a 54 y.o. 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  Most recent blood cultures from the 25th is not growing any organism.  His right knee is quite tender to palpation has an effusion I suspect is a site of septic joint  He was not able to answer any questions and was quite tachypneic this morning  #1 Confusion: Likely multifactorial this morning he appeared tachypneic and I wonder if he he needs ABG. He had  HD yesterday. Vancomycin should penetrate CNS  Relayed concerns to his RN and will to primary  #2 Recurrent MRSA bacteremia:       [facility] Staphylococcus aureus bacteremia   Staphylococcus aureus bacteremia (SAB) is associated with a high rate of complications and mortality.  Specific aspects of clinical management are critical to optimizing the outcome of patients with SAB.  Therefore, the [facility] Antimicrobial Management Team [facility]) has initiated an intervention aimed at improving the management of SAB at [facility].  To do so, Infectious Diseases physicians are providing an evidence-based consult for the management of all patients with SAB.     Yes No Comments  Perform follow-up blood cultures (even if the patient is afebrile) to ensure clearance of bacteremia [x]  []   was recent blood cultures are not growing any organism  Remove vascular catheter and obtain follow-up blood cultures after the removal of the catheter [x]  []    Perform echocardiography to evaluate for endocarditis (transthoracic ECHO is 40-50% sensitive, TEE is > 90% sensitive) [x]  []   he is refusing TEE  Consult electrophysiologist to evaluate implanted cardiac device (pacemaker, ICD) []  []  NA  Ensure source control []  []  Have all abscesses been drained effectively? Have deep seeded  infections (septic joints or osteomyelitis) had appropriate surgical debridement? No HD lines  Investigate for "metastatic" sites of infection []  []  Does the patient have ANY symptom or physical exam finding that would suggest a deeper infection (back or neck pain that may be suggestive of vertebral osteomyelitis or epidural abscess, muscle pain that could be a symptom of pyomyositis)?  Keep in mind that for deep seeded infections MRI imaging with contrast is preferred rather than other often insensitive tests such as plain x-rays, especially early in a patient's presentation.  I am concerned for his right knee Needs aspirate for cell count and diffe, culture. Could consider MRI if more stable  Change antibiotic therapy to vancomycin []  []  Beta-lactam antibiotics are preferred for MSSA due to higher cure rates.   If on Vancomycin, goal trough should be 15 - 20 mcg/mL  Estimated duration of IV antibiotic therapy:  6 weeks []  []  Consult case management for probably prolonged outpatient IV antibiotic therapy   #3 Septic knee: Suspect the right knee is infected and I think aspirate for cell count (priority over) culture would be helpful.  Do not think he would be stable enough for MRI at this time   LOS: 8 days   Alcide Evener 01/04/2020, 10:40 AM

## 2020-01-04 NOTE — Progress Notes (Addendum)
PROGRESS NOTE    Seth [name]  GYF:[id number] DOB: 1966/01/24 DOA: 01/08/2020 PCP: Mitzi Hansen, MD    Brief Narrative: 54 year old male with PMH as below, which is significant for ESRD on HD, chronic hypoxemic respiratory failure no home O2, OSA on CPAP, DM, discitis, and cord compression myelopathy. Most recent HD was 3/17. He left the session 45 mins early due to back pain. He was in his usual state of health until approximately 3/11 when he developed muscle aches and back pain. He had just received his 1st COVID-19 vaccine a few days prior and it was felt these symptoms were secondary to this. He has been seen twice in the ED in the past week. First for complaints of febrile illness. He was not given any antibiotics at that time. He then returned 3/18 with complaints of myalgias. He returned to ED later in the day 3/18 via EMS after being found to have altered mental status at home. Upon arrival to ED he was noted to be hypotensive with SBP in the 60s and hypoxemic with O2 sats in the 70s on room air. Improved with supplemental O2 and levophed infusion after 242m bolus. There were also concerns he had been unable to use his legs the past few days and MRI spine was ordered. PCCM asked to evaluate for admission due to need for vasoactive infusion.  Patient initially admitted to ICU for pressor management given septic shock, markedly improved, likely infected hemodialysis catheter removed on 12/27/2019, recurrent blood cultures remain positive. ID following.  **EVENING UPDATE 1900** Patient less responsive - ABG unable to be performed - VBG pending >> PCCM aware of patient's status - we discussed if his CO2 is markedly elevated or truly hypoxic we may have to discuss intubation although we are going to try to manage with cpap/bipap if we can ensure patient isn't going to aspirate/can control secretions which is our main concern at this time. Attempting to call family(mother) again to inform  her of patient's condition.  **AFTERNOON UPDATE 1600** Patient mental status continues to wax/wane - concerning for issues of aspiration - ABG and CXR pending to further evaluate - may need to advance to bipap for respiratory support if CO2 elevated - patient protecting airway currently. Patient remains high risk given systemic MRSA infection with recurrent fevers and borderline low BP.   Assessment & Plan:   Principal Problem:   MRSA bacteremia Active Problems:   Hyperlipidemia associated with type 2 diabetes mellitus ([place]   ESRD (end stage renal disease) on dialysis ([name])   Absolute anemia   DM (diabetes mellitus) type 2, uncontrolled, with ketoacidosis ([name])   Hyperkalemia   Hyponatremia   Septic shock ([name])   Sepsis due to infected intravenous catheter ([name])   Wrist pain, acute, left   Septic shock secondary to MRSA bacteremia secondary to infected HD catheter/ Patient had presented noted to be in septic shock requiring pressors felt secondary to infected HD catheter.  Infected HD catheter removed 12/27/2019 with purulence noted.  Patient remains off pressors. Blood cultures positive for MRSA.  2D echo negative for any vegetations endocarditis.  Repeat blood cultures from 12/28/2019 + for staph aureus. 2 of 5 blood cultures 3/22 remain positive.  TEE unable to be performed, patient now refusing -will consider patient endocarditis until this can be ruled out given ongoing hypotension, leukocytosis, and fevers -01/02/2020 late afternoon and overnight rapid response called due to low blood pressure and patient's mental status.  Patient able to wake  up oriented although continues to be markedly somnolent likely secondary to above multifactorial conditions. -12/10/2019 patient undergoing dialysis -able to arouse and answer simple questions, blood pressure currently well controlled, ID at bedside we discussed continuing current antibiotics, patient remains high risk given prolonged infection,  patient refused TEE -we will now treat as endocarditis with 6 weeks of antibiotics as we cannot rule out endocarditis.  Blood cultures from 325 remain preliminary negative - 01/04/20 discussed with ID given patient's right knee pain will attempt aspiration for further evaluation and culture to ensure no disseminated infection as well as to evaluate for possible debridement  Acute metabolic encephalopathy, multifactorial, improving -Likely secondary to above prolonged sepsis with what appears to be a large infectious burden given ongoing hypotension and fevers. -Mental status continues to wax and wane, this morning patient more awake alert and oriented yesterday however this afternoon patient is much more somnolent but arousable.  Consider MRI of the brain further evaluate for infectious burden however this would likely not change our clinical course as his antibiotics appear to be appropriate given MIC in discussion with ID, this is likely secondary to infectious burden, dialysis, uremia, polypharmacy less likely given patient's not currently on any narcotic or sedating medication.  Hyperkalemia/hyponatremia/acidosis/hyperphosphatemia Secondary to end-stage renal disease/dietary noncompliance.  Ongoing dialysis schedule and dialysis management per nephrology.   Lower extremity weakness, concurrent ambulatory dysfunction Patient still with lower extremity weakness with slight improvement.  Initially unable to obtain MRI due to claustrophobia however was given some Ativan and MRI obtained 12/29/2019.  MRI of the L-spine obtained with no acute abnormality or evidence for infection within the lumbar spine, technically limited exam due to motion artifact.  10 mm anterior listhesis of L5 on S1 with associated moderate bilateral subarticular stenosis, with moderate to severe bilateral L5 foraminal narrowing.  Right foraminal to extraforaminal disc protrusion at L3-4 potentially affecting the exiting right L3  nerve root.  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  PT/OT evaluation ongoing -possible need for placement pending further evaluation and clinical course.   Diabetes mellitus II, uncontrolled Hemoglobin A1c of 7.1.  CBG of 200s this morning.  Continue sliding scale insulin.  P.o. intake has been quite poor over the past 72 hours -hold off on long-acting insulin given patient high risk for hypoglycemic event should he decrease p.o. intake any further  End-stage renal disease MWF schedule Uremia resolved Patient being followed by nephrology as above  OSA CPAP as tolerated  Hypertension, essential - currently borderline hypotensive Midodrine 15 mg 3 times daily per nephrology.  Continue to hold antihypertensives as indicated   DVT prophylaxis: Heparin Code Status: Full Disposition Plan:  . Patient admitted from: Home      . Anticipated d/c place: Undetermined - SNF vs Home with home health pending clinical course  . Barriers to d/c OR conditions which need to be met to effect a safe d/c: Likely home with home health versus SNF pending PT evaluation, once clinically improved, improvement with hypotension/sepsis, electrolyte abnormalities corrected, improving leukocytosis, will need to be cleared by ID and nephrology.   Consultants:   Patient admitted to critical care service  ID: Dr.Comer 12/27/2019  Nephrology: Dr. Hollie Salk 12/27/2019  Procedures:   CT left wrist 12/27/2019  CT angiogram chest 12/20/2019  Plain films of the left wrist 12/27/2019  Chest x-ray 01/04/2020  Incomplete MRI T and L-spine 12/27/2019--- patient could not tolerate due to claustrophobia.  2D echo 12/27/2019  Successful  removal of tunneled left IJ hemodialysis catheter with purulence expressed from tract per Ascencion Dike, PA interventional radiology 12/27/2019  Pending right knee aspiration*  Antimicrobials:  IV Zosyn 12/27/2019  x1  IV vancomycin 12/24/2019   Subjective: Rapid response overnight again due to low blood pressure, mental status changes.  Patient this morning appears well in dialysis although somewhat somnolent but arousable and answers questions appropriately.  Review of systems somewhat limited due to patient's current status.  Objective: Vitals:   01/02/2020 1728 01/08/2020 2100 12/27/2019 2317 01/04/20 0018  BP: (!) 81/48 (!) 101/52  (!) 113/52  Pulse: (!) 107 99 95 93  Resp: 20 20 (!) 33 20  Temp: 98.6 F (37 C) 98 F (36.7 C)  98.5 F (36.9 C)  TempSrc: Oral Oral  Axillary  SpO2: 100% 100% 92% 95%  Weight:      Height:        Intake/Output Summary (Last 24 hours) at 01/04/2020 0708 Last data filed at 01/04/2020 0300 Gross per 24 hour  Intake 240 ml  Output -329 ml  Net 569 ml   Filed Weights   01/02/20 0444 12/28/2019 0500 12/11/2019 0915  Weight: 124.3 kg 123.2 kg 123.2 kg    Examination:  General exam: NAD. Resting comfortably, somnolent but easily arousable -answer simple questions appropriately. Respiratory system: CTAB anterior lung fields with no wheezes, no crackles, no rhonchi.  Normal respiratory effort.   Cardiovascular system: Regular rate rhythm no murmurs rubs or gallops.  No JVD.  No lower extremity edema.  Gastrointestinal system: Abdomen is soft, nontender, nondistended, positive bowel sounds.  No rebound.  No guarding.   Central nervous system: Somewhat drowsy however following commands moving extremities.   Extremities: 3-4/5 bilateral lower extremity strength.   Data Reviewed: I have personally reviewed following labs and imaging studies  CBC: Recent Labs  Lab 12/31/19 0428 12/31/19 0428 12/09/2019 0245 01/02/20 0054 01/02/20 1607 12/28/2019 0333 01/04/20 0328  WBC 29.1*   < > 32.5* 41.6* 35.6* 36.0* 29.7*  NEUTROABS 25.0*  --  25.1* 33.2*  --  28.8* 24.0*  HGB 9.2*   < > 9.0* 8.9* 8.6* 8.5* 7.6*  HCT 26.9*   < > 26.7* 27.0* 26.0* 26.6* 24.7*  MCV 84.9   < >  85.3 87.9 88.4 88.7 91.5  PLT 342   < > 376 373 354 354 328   < > = values in this interval not displayed.   Basic Metabolic Panel: Recent Labs  Lab 12/28/19 0818 12/28/19 0818 12/29/19 8119 12/30/19 1478 12/31/19 2956 12/31/19 0428 12/27/2019 0245 01/02/20 0054 01/02/20 1607 12/19/2019 0333 01/04/20 0328  NA 125*   < > 129*   < > 126*   < > 130* 132* 134* 135 134*  K 6.3*   < > 4.7   < > 5.0   < > 5.3* 5.1 4.0 4.4 4.1  CL 87*   < > 90*   < > 87*   < > 91* 93* 95* 96* 93*  CO2 19*   < > 20*   < > 22   < > 22 17* '23 22 22  '$ GLUCOSE 114*   < > 135*   < > 144*   < > 170* 158* 178* 186* 207*  BUN 81*   < > 53*   < > 102*   < > 56* 27* 49* 61* 36*  CREATININE 10.97*   < > 6.56*   < > 8.57*   < > 5.47* 3.36* 5.02*  5.79* 4.24*  CALCIUM 7.6*   < > 8.7*   < > 8.8*   < > 8.8* 9.2 9.3 9.0 9.0  MG 2.0  --  1.9  --   --   --   --   --   --   --   --   PHOS 7.0*   < > 6.1*   < > 6.4*  --  5.7* 4.7*  --  6.0* 4.7*   < > = values in this interval not displayed.   GFR: Estimated Creatinine Clearance: 25.1 mL/min (A) (by C-G formula based on SCr of 4.24 mg/dL (H)). Liver Function Tests: Recent Labs  Lab 12/31/2019 0245 01/02/20 0054 01/02/20 1607 01/02/2020 0333 01/04/20 0328  AST  --   --  87*  --   --   ALT  --   --  30  --   --   ALKPHOS  --   --  256*  --   --   BILITOT  --   --  1.3*  --   --   PROT  --   --  7.7  --   --   ALBUMIN 1.7* 1.6* 1.7* 1.7* 1.7*   No results for input(s): LIPASE, AMYLASE in the last 168 hours. Recent Labs  Lab 01/02/20 1704  AMMONIA 31   Coagulation Profile: No results for input(s): INR, PROTIME in the last 168 hours. Recent Labs  Lab 01/02/20 1727 01/02/20 2122 12/12/2019 0711 01/02/2020 1726 01/08/2020 2123  GLUCAP 163* 161* 183* 168* 174*   Sepsis Labs: Recent Labs  Lab 01/02/20 1704 01/02/20 1815  LATICACIDVEN 1.9 1.5    Recent Results (from the past 240 hour(s))  Blood Culture (routine x 2)     Status: Abnormal   Collection Time: 01/06/2020  10:15 PM   Specimen: BLOOD RIGHT ARM  Result Value Ref Range Status   Specimen Description BLOOD RIGHT ARM  Final   Special Requests   Final    BOTTLES DRAWN AEROBIC AND ANAEROBIC Blood Culture results may not be optimal due to an inadequate volume of blood received in culture bottles   Culture  Setup Time   Final    GRAM POSITIVE COCCI IN CLUSTERS IN BOTH AEROBIC AND ANAEROBIC BOTTLES CRITICAL RESULT CALLED TO, READ BACK BY AND VERIFIED WITH: J. Roselie Skinner, AT 1009 12/27/19 DV Performed at [facility], [place] [address], [place], [place] [address]    Culture METHICILLIN RESISTANT STAPHYLOCOCCUS AUREUS (A)  Final   Report Status 12/29/2019 FINAL  Final   Organism ID, Bacteria METHICILLIN RESISTANT STAPHYLOCOCCUS AUREUS  Final      Susceptibility   Methicillin resistant staphylococcus aureus - MIC*    CIPROFLOXACIN <=0.5 SENSITIVE Sensitive     ERYTHROMYCIN >=8 RESISTANT Resistant     GENTAMICIN <=0.5 SENSITIVE Sensitive     OXACILLIN >=4 RESISTANT Resistant     TETRACYCLINE <=1 SENSITIVE Sensitive     VANCOMYCIN 1 SENSITIVE Sensitive     TRIMETH/SULFA <=10 SENSITIVE Sensitive     CLINDAMYCIN <=0.25 SENSITIVE Sensitive     RIFAMPIN <=0.5 SENSITIVE Sensitive     Inducible Clindamycin NEGATIVE Sensitive     * METHICILLIN RESISTANT STAPHYLOCOCCUS AUREUS  Blood Culture (routine x 2)     Status: Abnormal   Collection Time: 12/11/2019 10:41 PM   Specimen: BLOOD RIGHT HAND  Result Value Ref Range Status   Specimen Description BLOOD RIGHT HAND  Final   Special Requests AEROBIC BOTTLE ONLY Blood Culture adequate  volume  Final   Culture  Setup Time   Final    GRAM POSITIVE COCCI IN CLUSTERS AEROBIC BOTTLE ONLY CRITICAL VALUE NOTED.  VALUE IS CONSISTENT WITH PREVIOUSLY REPORTED AND CALLED VALUE.    Culture (A)  Final    STAPHYLOCOCCUS AUREUS SUSCEPTIBILITIES PERFORMED ON PREVIOUS CULTURE WITHIN THE LAST 5 DAYS. Performed at [facility], [place] [address] [place], [place]  [address]    Report Status 12/29/2019 FINAL  Final  Blood Culture ID Panel (Reflexed)     Status: Abnormal   Collection Time: 12/24/2019 10:41 PM  Result Value Ref Range Status   Enterococcus species NOT DETECTED NOT DETECTED Final   Listeria monocytogenes NOT DETECTED NOT DETECTED Final   Staphylococcus species DETECTED (A) NOT DETECTED Final    Comment: CRITICAL RESULT CALLED TO, READ BACK BY AND VERIFIED WITH: Andres Shad PharmD 14:30 12/27/19 ([email or web address])    Staphylococcus aureus (BCID) DETECTED (A) NOT DETECTED Final    Comment: Methicillin (oxacillin)-resistant Staphylococcus aureus (MRSA). MRSA is predictably resistant to beta-lactam antibiotics (except ceftaroline). Preferred therapy is vancomycin unless clinically contraindicated. Patient requires contact precautions if  hospitalized. CRITICAL RESULT CALLED TO, READ BACK BY AND VERIFIED WITH: Andres Shad PharmD 14:30 12/27/19 ([email or web address])    Methicillin resistance DETECTED (A) NOT DETECTED Final    Comment: CRITICAL RESULT CALLED TO, READ BACK BY AND VERIFIED WITH: Andres Shad PharmD 14:30 12/27/19 ([email or web address])    Streptococcus species NOT DETECTED NOT DETECTED Final   Streptococcus agalactiae NOT DETECTED NOT DETECTED Final   Streptococcus pneumoniae NOT DETECTED NOT DETECTED Final   Streptococcus pyogenes NOT DETECTED NOT DETECTED Final   Acinetobacter baumannii NOT DETECTED NOT DETECTED Final   Enterobacteriaceae species NOT DETECTED NOT DETECTED Final   Enterobacter cloacae complex NOT DETECTED NOT DETECTED Final   Escherichia coli NOT DETECTED NOT DETECTED Final   Klebsiella oxytoca NOT DETECTED NOT DETECTED Final   Klebsiella pneumoniae NOT DETECTED NOT DETECTED Final   Proteus species NOT DETECTED NOT DETECTED Final   Serratia marcescens NOT DETECTED NOT DETECTED Final   Haemophilus influenzae NOT DETECTED NOT DETECTED Final   Neisseria meningitidis NOT DETECTED NOT DETECTED Final   Pseudomonas aeruginosa NOT DETECTED NOT DETECTED Final    Candida albicans NOT DETECTED NOT DETECTED Final   Candida glabrata NOT DETECTED NOT DETECTED Final   Candida krusei NOT DETECTED NOT DETECTED Final   Candida parapsilosis NOT DETECTED NOT DETECTED Final   Candida tropicalis NOT DETECTED NOT DETECTED Final    Comment: Performed at [facility], [place]. [address] [place], [place] [address]  Respiratory Panel by RT PCR (Flu A&B, Covid) - Nasopharyngeal Swab     Status: None   Collection Time: 12/31/2019 11:22 PM   Specimen: Nasopharyngeal Swab  Result Value Ref Range Status   SARS Coronavirus 2 by RT PCR NEGATIVE NEGATIVE Final    Comment: (NOTE) SARS-CoV-2 target nucleic acids are NOT DETECTED. The SARS-CoV-2 RNA is generally detectable in upper respiratoy specimens during the acute phase of infection. The lowest concentration of SARS-CoV-2 viral copies this assay can detect is 131 copies/mL. A negative result does not preclude SARS-Cov-2 infection and should not be used as the sole basis for treatment or other patient management decisions. A negative result may occur with  improper specimen collection/handling, submission of specimen other than nasopharyngeal swab, presence of viral mutation(s) within the areas targeted by this assay, and inadequate number of viral copies (<131 copies/mL). A negative result must be combined with clinical observations,  patient history, and epidemiological information. The expected result is Negative. Fact Sheet for Patients:  PinkCheek.be Fact Sheet for Healthcare Providers:  GravelBags.it This test is not yet ap proved or cleared by the [place] FDA and  has been authorized for detection and/or diagnosis of SARS-CoV-2 by FDA under an Emergency Use Authorization (EUA). This EUA will remain  in effect (meaning this test can be used) for the duration of the COVID-19 declaration under Section 564(b)(1) of the Act, 21 U.S.C. section  360bbb-3(b)(1), unless the authorization is terminated or revoked sooner.    Influenza A by PCR NEGATIVE NEGATIVE Final   Influenza B by PCR NEGATIVE NEGATIVE Final    Comment: (NOTE) The Xpert Xpress SARS-CoV-2/FLU/RSV assay is intended as an aid in  the diagnosis of influenza from Nasopharyngeal swab specimens and  should not be used as a sole basis for treatment. Nasal washings and  aspirates are unacceptable for Xpert Xpress SARS-CoV-2/FLU/RSV  testing. Fact Sheet for Patients: PinkCheek.be Fact Sheet for Healthcare Providers: GravelBags.it This test is not yet approved or cleared by the [place] FDA and  has been authorized for detection and/or diagnosis of SARS-CoV-2 by  FDA under an Emergency Use Authorization (EUA). This EUA will remain  in effect (meaning this test can be used) for the duration of the  Covid-19 declaration under Section 564(b)(1) of the Act, 21  U.S.C. section 360bbb-3(b)(1), unless the authorization is  terminated or revoked. Performed at [facility], [place] [address], [place], [place] [address]   MRSA PCR Screening     Status: Abnormal   Collection Time: 12/27/19  5:30 AM   Specimen: Nasopharyngeal  Result Value Ref Range Status   MRSA by PCR POSITIVE (A) NEGATIVE Final    Comment:        The GeneXpert MRSA Assay (FDA approved for NASAL specimens only), is one component of a comprehensive MRSA colonization surveillance program. It is not intended to diagnose MRSA infection nor to guide or monitor treatment for MRSA infections. RESULT CALLED TO, READ BACK BY AND VERIFIED WITH: RN Sarajane Jews [phone] FCP Performed at [place] [address], [place], [place] [address]   Culture, blood (routine x 2)     Status: Abnormal   Collection Time: 12/28/19  5:40 AM   Specimen: BLOOD RIGHT HAND  Result Value Ref Range Status   Specimen Description BLOOD RIGHT HAND  Final     Special Requests   Final    BOTTLES DRAWN AEROBIC AND ANAEROBIC Blood Culture adequate volume   Culture  Setup Time   Final    IN BOTH AEROBIC AND ANAEROBIC BOTTLES GRAM POSITIVE COCCI IN CLUSTERS CRITICAL VALUE NOTED.  VALUE IS CONSISTENT WITH PREVIOUSLY REPORTED AND CALLED VALUE.    Culture (A)  Final    STAPHYLOCOCCUS AUREUS SUSCEPTIBILITIES PERFORMED ON PREVIOUS CULTURE WITHIN THE LAST 5 DAYS. Performed at [facility], [place] [address] [place], [place] [address]    Report Status 01/02/2020 FINAL  Final  Culture, blood (routine x 2)     Status: Abnormal   Collection Time: 12/28/19  5:59 AM   Specimen: BLOOD RIGHT HAND  Result Value Ref Range Status   Specimen Description BLOOD RIGHT HAND  Final   Special Requests IN PEDIATRIC BOTTLE Blood Culture adequate volume  Final   Culture  Setup Time   Final    GRAM POSITIVE COCCI IN PEDIATRIC BOTTLE CRITICAL RESULT CALLED TO, READ BACK BY AND VERIFIED WITH:  PHARMD LINDSAY CHEN '@2021'$  12/28/2019 AKT    Culture (A)  Final    STAPHYLOCOCCUS AUREUS SUSCEPTIBILITIES PERFORMED ON PREVIOUS CULTURE WITHIN THE LAST 5 DAYS. Performed at [facility], [place] [address] [place], [place] [address]    Report Status 12/29/2019 FINAL  Final  Culture, blood (routine x 2)     Status: None (Preliminary result)   Collection Time: 12/30/19  6:35 AM   Specimen: BLOOD  Result Value Ref Range Status   Specimen Description BLOOD RIGHT ANTECUBITAL  Final   Special Requests   Final    BOTTLES DRAWN AEROBIC AND ANAEROBIC Blood Culture adequate volume   Culture  Setup Time   Final    GRAM POSITIVE COCCI AEROBIC BOTTLE ONLY CRITICAL VALUE NOTED.  VALUE IS CONSISTENT WITH PREVIOUSLY REPORTED AND CALLED VALUE. Performed at [facility], [place] [address], [place], [place] [address]    Culture GRAM POSITIVE COCCI  Final   Report Status PENDING  Incomplete  Culture, blood (routine x 2)     Status: Abnormal   Collection Time: 12/30/19  6:40 AM    Specimen: BLOOD RIGHT HAND  Result Value Ref Range Status   Specimen Description BLOOD RIGHT HAND  Final   Special Requests   Final    BOTTLES DRAWN AEROBIC AND ANAEROBIC Blood Culture adequate volume   Culture  Setup Time   Final    GRAM POSITIVE COCCI IN CLUSTERS IN BOTH AEROBIC AND ANAEROBIC BOTTLES CRITICAL VALUE NOTED.  VALUE IS CONSISTENT WITH PREVIOUSLY REPORTED AND CALLED VALUE. Performed at [facility], [place] [address], [place], [place] [address]    Culture METHICILLIN RESISTANT STAPHYLOCOCCUS AUREUS (A)  Final   Report Status 01/02/2020 FINAL  Final   Organism ID, Bacteria METHICILLIN RESISTANT STAPHYLOCOCCUS AUREUS  Final      Susceptibility   Methicillin resistant staphylococcus aureus - MIC*    CIPROFLOXACIN <=0.5 SENSITIVE Sensitive     ERYTHROMYCIN >=8 RESISTANT Resistant     GENTAMICIN <=0.5 SENSITIVE Sensitive     OXACILLIN >=4 RESISTANT Resistant     TETRACYCLINE <=1 SENSITIVE Sensitive     VANCOMYCIN 1 SENSITIVE Sensitive     TRIMETH/SULFA <=10 SENSITIVE Sensitive     CLINDAMYCIN <=0.25 SENSITIVE Sensitive     RIFAMPIN <=0.5 SENSITIVE Sensitive     Inducible Clindamycin NEGATIVE Sensitive     * METHICILLIN RESISTANT STAPHYLOCOCCUS AUREUS  Culture, blood (routine x 2)     Status: None (Preliminary result)   Collection Time: 12/31/19 11:09 AM   Specimen: BLOOD RIGHT HAND  Result Value Ref Range Status   Specimen Description BLOOD RIGHT HAND  Final   Special Requests AEROBIC BOTTLE ONLY Blood Culture adequate volume  Final   Culture   Final    NO GROWTH 3 DAYS Performed at [facility], [place] [address] [place], [place] [address]    Report Status PENDING  Incomplete  Culture, blood (routine x 2)     Status: Abnormal   Collection Time: 12/31/19 11:23 AM   Specimen: BLOOD RIGHT HAND  Result Value Ref Range Status   Specimen Description BLOOD RIGHT HAND  Final   Special Requests   Final    AEROBIC BOTTLE ONLY Blood Culture results may not be optimal  due to an inadequate volume of blood received in culture bottles   Culture  Setup Time   Final    AEROBIC BOTTLE ONLY GRAM POSITIVE COCCI CRITICAL VALUE NOTED.  VALUE IS CONSISTENT WITH PREVIOUSLY  REPORTED AND CALLED VALUE.    Culture (A)  Final    STAPHYLOCOCCUS AUREUS SUSCEPTIBILITIES PERFORMED ON PREVIOUS CULTURE WITHIN THE LAST 5 DAYS. Performed at [facility], [place] [address] [place], [place] [address]    Report Status 12/22/2019 FINAL  Final  Culture, blood (Routine X 2) w Reflex to ID Panel     Status: None (Preliminary result)   Collection Time: 01/02/20 12:40 AM   Specimen: BLOOD RIGHT HAND  Result Value Ref Range Status   Specimen Description BLOOD RIGHT HAND  Final   Special Requests AEROBIC BOTTLE ONLY Blood Culture adequate volume  Final   Culture   Final    NO GROWTH 1 DAY Performed at [facility], [place] [address] [place], [place] [address]    Report Status PENDING  Incomplete  Culture, blood (Routine X 2) w Reflex to ID Panel     Status: None (Preliminary result)   Collection Time: 01/02/20 12:50 AM   Specimen: BLOOD RIGHT HAND  Result Value Ref Range Status   Specimen Description BLOOD RIGHT HAND  Final   Special Requests   Final    BOTTLES DRAWN AEROBIC AND ANAEROBIC Blood Culture adequate volume   Culture   Final    NO GROWTH 1 DAY Performed at [facility], [place] [address], [place], [place] [address]    Report Status PENDING  Incomplete     Radiology Studies: No results found.  Scheduled Meds: . Chlorhexidine Gluconate Cloth  6 each Topical Q0600  . ferric citrate  420 mg Oral TID WC  . heparin  5,000 Units Subcutaneous Q8H  . insulin aspart  0-20 Units Subcutaneous TID WC  . lidocaine  15 mL Oral Once  . midodrine  15 mg Oral TID WC  . pantoprazole  40 mg Oral Q0600   Continuous Infusions: . sodium chloride 250 mL (12/27/19 0436)  . sodium chloride 100 mL (01/05/2020 0049)  . sodium chloride    . vancomycin Stopped (12/31/2019  1425)     LOS: 8 days   Time spent: 45 minutes  Little Ishikawa, DO [facility]  01/04/2020, 7:08 AM

## 2020-01-04 NOTE — Progress Notes (Signed)
[name], male   DOB: 16-May-1966, 54 y.o.   MRN: [id number] [facility] Progress Note   Assessment/ Plan:   1. Septic shock with recurrent MRSA bacteremia: Remains on intravenous vancomycin and will get this for the next 6 weeks as empiric management for SBE.  Concern now raised with right knee swelling and the possibility for septic arthritis. 2. ESRD: Usually on a Monday/Wednesday/Friday schedule as an outpatient and he underwent hemodialysis yesterday.  He does not have acute indications for dialysis today and I will plan on scheduled hemodialysis again on Monday unless acutely needed. 3. Anemia: Hemoglobin and hematocrit remain low in the setting of recurrent infection/inflammatory complex.  Continue to monitor on ESA. 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 5. Nutrition: Low albumin likely secondary to infection/negative acute phase reaction; continue ONS/renal diet  Subjective:   Afebrile but hypotensive overnight.  No ultrafiltration with dialysis due to blood pressures at the time.  He denies any chest pain or shortness of breath.   Objective:   BP 103/82   Pulse 94   Temp 99.4 F (37.4 C) (Axillary)   Resp (!) 22   Ht 5\' 8"  (1.727 m)   Wt 123.2 kg   SpO2 92%   BMI 41.30 kg/m   Physical Exam: Gen: Appears fatigued resting in bed, on oxygen via nasal cannula CVS: Pulse regular tachycardia, S1 and S2 normal Resp: Anteriorly clear to auscultation, no rales/rhonchi Abd: Soft, obese, nontender Ext: No lower extremity edema, left upper arm AV fistula with present thrill/intact dressings.  Mild right knee swelling noted-nontender.  Labs: BMET Recent Labs  Lab 12/29/19 0751 12/29/19 0751 12/30/19 1660 12/31/19 0428 12/09/2019 0245 01/02/20 0054 01/02/20 1607 01/08/2020 0333 01/04/20 0328  NA 129*   < > 127* 126* 130* 132* 134* 135 134*  K 4.7   < > 4.9 5.0 5.3* 5.1 4.0  4.4 4.1  CL 90*   < > 88* 87* 91* 93* 95* 96* 93*  CO2 20*   < > 18* 22 22 17* 23 22 22   GLUCOSE 135*   < > 147* 144* 170* 158* 178* 186* 207*  BUN 53*   < > 79* 102* 56* 27* 49* 61* 36*  CREATININE 6.56*   < > 7.47* 8.57* 5.47* 3.36* 5.02* 5.79* 4.24*  CALCIUM 8.7*   < > 8.6* 8.8* 8.8* 9.2 9.3 9.0 9.0  PHOS 6.1*  --  6.3* 6.4* 5.7* 4.7*  --  6.0* 4.7*   < > = values in this interval not displayed.   CBC Recent Labs  Lab 12/30/2019 0245 12/17/2019 0245 01/02/20 0054 01/02/20 1607 12/22/2019 0333 01/04/20 0328  WBC 32.5*   < > 41.6* 35.6* 36.0* 29.7*  NEUTROABS 25.1*  --  33.2*  --  28.8* 24.0*  HGB 9.0*   < > 8.9* 8.6* 8.5* 7.6*  HCT 26.7*   < > 27.0* 26.0* 26.6* 24.7*  MCV 85.3   < > 87.9 88.4 88.7 91.5  PLT 376   < > 373 354 354 328   < > = values in this interval not displayed.     Medications:    . Chlorhexidine Gluconate Cloth  6 each Topical Q0600  . ferric citrate  420 mg Oral TID WC  . heparin  5,000 Units Subcutaneous Q8H  . insulin aspart  0-20 Units Subcutaneous TID WC  . lidocaine  15 mL Oral Once  . midodrine  15 mg Oral TID WC  . pantoprazole  40 mg Oral Q0600   Elmarie Shiley, MD 01/04/2020, 8:35 AM

## 2020-01-04 NOTE — Procedures (Signed)
Central Venous Catheter Insertion Procedure Note Seth [name] [id number] 03/31/1966  Procedure: Insertion of Central Venous Catheter Indications: Assessment of intravascular volume, Drug and/or fluid administration and Frequent blood sampling  Procedure Details Consent: Risks of procedure as well as the alternatives and risks of each were explained to the (patient/caregiver).  Consent for procedure obtained. Time Out: Verified patient identification, verified procedure, site/side was marked, verified correct patient position, special equipment/implants available, medications/allergies/relevent history reviewed, required imaging and test results available.  Performed  Maximum sterile technique was used including antiseptics, cap, gloves, gown, hand hygiene, mask and sheet. Skin prep: Chlorhexidine; local anesthetic administered A antimicrobial bonded/coated triple lumen catheter was placed in the left femoral vein due to multiple attempts, no other available access using the Seldinger technique.  Evaluation Blood flow good Complications: No apparent complications Patient did tolerate procedure well. Chest X-ray ordered to verify placement.  CXR: not indicated.  Seth [name] 01/04/2020, 11:02 PM

## 2020-01-04 NOTE — Progress Notes (Addendum)
Provider made aware that patient's mental status has changed. He is now only alert to self and not as easily arousable compared to a few hours ago. Also, has become tachypneic in the 40s.  Also made aware that pressure is 84/56 with MAP 65. No orders given due to his fluid restrictions per nephrology. However, was told to inform provider if MAP falls below 65.  Charge RN was made aware of change in mental status and RASS -4, GCS is 11, and tachypneic in the 40s.  Orders for ABG was given. RT was called to do stat ABG. However, unable to do ABG due to patient's poor vasculature. Provider agreed to a VBG instead  Rapid response nurse also called to bedside to assess the patient and assist with possible transfer to ICU.  VBG results were received at [place] and given to Dr. Avon Gully and [place] CCM team. PH 7.29 CO2 57.8 O2 52.9 HCO3 26.1.   [facility] night time provider was paged to assist with transferring the patient to the ICU. RN spoke with Elzie Rings with [facility] and updated on the situation.   Night time CCM will be on the floor shortly.

## 2020-01-04 NOTE — Consult Note (Signed)
NAME:  Seth [name], MRN:  [id number], DOB:  01/21/1966, LOS: 8 ADMISSION DATE:  12/28/2019, CONSULTATION DATE:  3/19 REFERRING MD:  Dr. Ralene Bathe EDP, CHIEF COMPLAINT:  AMS   Brief History   54 year old male with MMP presented with back pain and AMS 3/19 and was admitted with presumed septic shock from unclear source, but possibly bacteremic from tunneled HD catheter.   History of present illness   54 year old male with PMH as below, which is significant for ESRD on HD, chronic hypoxemic respiratory failure no home O2, OSA on CPAP, DM, discitis, and cord compression myelopathy. Most recent HD was 3/17. He left the session 45 mins early due to back pain. He was in his usual state of health until approximately 3/11 when he developed muscle aches and back pain. He had just received his 1st COVID-19 vaccine a few days prior and it was felt these symptoms were secondary to this. He has been seen twice in the ED in the past week. First for complaints of febrile illness. He was not given any antibiotics at that time. He then returned 3/18 with complaints of myalgias. He returned to ED later in the day 3/18 via EMS after being found to have altered mental status at home. Upon arrival to ED he was noted to be hypotensive with SBP in the 60s and hypoxemic with O2 sats in the 70s on room air. Improved with supplemental O2 and levophed infusion after 235mL bolus. There were also concerns he had been unable to use his legs the past few days and MRI spine was ordered. PCCM asked to evaluate for admission due to need for vasoactive infusion.    Pt was treated with pressors and antibiotics, he improved and was transferred out of ICU on 3/2.  Blood cultures were positive for MRSA. 2D echo negative for vegetations, pt refused TEE.  Pt also noted to have swollen and painful R knee.  Seen by ID and treated with Vancomycin.     Per nursing, pt was fairly awake and oriented earlier today and eating meals.  Around 4pm  became more somnolent with worsening respiratory status, hypotension and fever to 102F.  Repeat CXR showed increased central vascular congesion and new RML consolidation favored to be atelectasis.  PCCM consulted for worsening respiratory status and patient was transferred to the ICU and intubated.  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   Significant Hospital Events   3/18 admit  Consults:  PCCM Nephrology  Procedures:  3/27 L femoral CVC 3/27 ETT  Significant Diagnostic Tests:  CTA chest 3/18 > No central pulmonary embolism. Multifocal bilateral pulmonary peripheral nodularities, the larger with central cavitation. 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 Stable T10 lytic lesion, likely a benign hemangioma. T11-L1 smaller lytic lesions, possibly degenerative. MRI spine 3/19 > 3/28 CT head>>  Micro Data:  Blood cultures 3/18 >MRSA 3/28 BCx2>> 3/28 Respiratory culture>>  Antimicrobials:  Zosyn 3/18 > Vancomycin 3/18 >  Interim history/subjective:  Pt given trial of CPAP without improving respiratory or mental status  Objective   Blood pressure (!) 105/46, pulse 92, temperature (!)  102.4 F (39.1 C), temperature source Axillary, resp. rate (!) 43, height 5\' 8"  (1.727 m), weight 123.2 kg, SpO2 100 %.        Intake/Output Summary (Last 24 hours) at 01/04/2020 2131 Last data filed at 01/04/2020 1200 Gross per 24 hour  Intake 480 ml  Output --  Net 480 ml   Filed Weights   01/02/20 0444 12/09/2019 0500 12/25/2019 0915  Weight: 124.3 kg 123.2  kg 123.2 kg    General:  Obese male, somnolent HEENT: MM pink/moist Neuro: awake and will open eyes to voice, grimaces to pain, but not answering questions or following commands, PERRLA CV: s1s2 tachycardic, regular, no m/r/g PULM:  Rhonchi bilaterally with decreased air movement RLL GI: soft, bsx4 active  Extremities: warm/dry, R knee erythematous and edematous with pain on passive ROM  Skin: no rashes or lesions   Resolved Hospital Problem list     Assessment & Plan:   Acute on chronic hypoxic respiratory failure -failed trial of CPAP and required intubation -CXR with new RML, possibly atelectasis P: -goals of care discussed with patient's sister who is primary decision maker, stated that she would like a trial of ventilator support, though if patient is not improving does not think he would want prolonged life support -pulmonary toilet -Maintain full vent support with SAT/SBT as tolerated -titrate Vent setting to maintain SpO2 greater than or equal to 90%. -HOB elevated 30 degrees. -Plateau pressures less than 30 cm H20.  -Follow chest x-ray, ABG prn.   -Bronchial hygiene and RT/bronchodilator protocol. -Post-intubation ABG pending    Shock, likely septic secondary to MRSA bacteremia -requiring levophed and vasopressin after 1.5L IVF  -initially thought to be due to infected dialysis catheter  -R knee erythemetous, consider septic joint -ID following P: -continue Vasopressin and Levophed to maintain MAP >65 -Broad coverage with Vanc and Cefepime -Repeat BCx2 and Tracheal aspirate  -R knee plain films and consult ortho during the day -stress dose steroids -Check TEE to r/o vegetation now that patient is inutbated     Encephalopathy -possibly precipitated by worsening sepsis with respiratory failure  -pH 7.29 and pCO2 57 P: -repeat ABG post-intubation -check head CT   ESRD -nephrology following, M/W/F schedule P: -next dialysis 3/29, monitor  electrolytes  Chronic Anemia -Hgb drop to 7.6 tonight, no sign of active bleeding P: -check type and screen and repeat CBC in the AM  Type 2 DM -SSI  Best practice:  Diet: NPO Pain/Anxiety/Delirium protocol (if indicated): propofol VAP protocol (if indicated):HOB 30 and daily SBT DVT prophylaxis: heparin GI prophylaxis: protonix Glucose control: SSI Mobility: BR Code Status: FULL Family Communication: Patient's sister and multiple family members updated Disposition: ICU  Labs   CBC: Recent Labs  Lab 12/31/19 0428 12/31/19 0428 12/19/2019 0245 01/02/20 0054 01/02/20 1607 01/08/2020 0333 01/04/20 0328  WBC 29.1*   < > 32.5* 41.6* 35.6* 36.0* 29.7*  NEUTROABS 25.0*  --  25.1* 33.2*  --  28.8* 24.0*  HGB 9.2*   < > 9.0* 8.9* 8.6* 8.5* 7.6*  HCT 26.9*   < > 26.7* 27.0* 26.0* 26.6* 24.7*  MCV 84.9   < > 85.3 87.9 88.4 88.7 91.5  PLT 342   < > 376 373 354 354 328   < > = values in this interval not displayed.    Basic Metabolic Panel: Recent Labs  Lab 12/29/19 0751 12/30/19 5449 12/31/19 0428 12/31/19 0428 01/04/2020 0245 01/02/20 0054 01/02/20 1607 12/11/2019 0333 01/04/20 0328  NA 129*   < >  126*   < > 130* 132* 134* 135 134*  K 4.7   < > 5.0   < > 5.3* 5.1 4.0 4.4 4.1  CL 90*   < > 87*   < > 91* 93* 95* 96* 93*  CO2 20*   < > 22   < > 22 17* 23 22 22   GLUCOSE 135*   < > 144*   < > 170* 158* 178* 186* 207*  BUN 53*   < > 102*   < > 56* 27* 49* 61* 36*  CREATININE 6.56*   < > 8.57*   < > 5.47* 3.36* 5.02* 5.79* 4.24*  CALCIUM 8.7*   < > 8.8*   < > 8.8* 9.2 9.3 9.0 9.0  MG 1.9  --   --   --   --   --   --   --   --   PHOS 6.1*   < > 6.4*  --  5.7* 4.7*  --  6.0* 4.7*   < > = values in this interval not displayed.   GFR: Estimated Creatinine Clearance: 25.1 mL/min (A) (by C-G formula based on SCr of 4.24 mg/dL (H)). Recent Labs  Lab 01/02/20 0054 01/02/20 1607 01/02/20 1704 01/02/20 1815 12/24/2019 0333 01/04/20 0328  WBC 41.6* 35.6*  --   --  36.0* 29.7*   LATICACIDVEN  --   --  1.9 1.5  --   --     Liver Function Tests: Recent Labs  Lab 12/25/2019 0245 01/02/20 0054 01/02/20 1607 12/28/2019 0333 01/04/20 0328  AST  --   --  87*  --   --   ALT  --   --  30  --   --   ALKPHOS  --   --  256*  --   --   BILITOT  --   --  1.3*  --   --   PROT  --   --  7.7  --   --   ALBUMIN 1.7* 1.6* 1.7* 1.7* 1.7*   No results for input(s): LIPASE, AMYLASE in the last 168 hours. Recent Labs  Lab 01/02/20 1704  AMMONIA 31    ABG    Component Value Date/Time   PHART 7.290 (L) 01/04/2020 1907   PCO2ART 57.8 (H) 01/04/2020 1907   PO2ART 52.7 (L) 01/04/2020 1907   HCO3 26.1 01/04/2020 1907   TCO2 22 12/14/2019 2245   ACIDBASEDEF 5.0 (H) 12/17/2019 2245   O2SAT 75.2 01/04/2020 1907     Coagulation Profile: No results for input(s): INR, PROTIME in the last 168 hours.  Cardiac Enzymes: No results for input(s): CKTOTAL, CKMB, CKMBINDEX, TROPONINI in the last 168 hours.  HbA1C: Hemoglobin A1C  Date/Time Value Ref Range Status  08/15/2019 02:41 PM 7.0 (A) 4.0 - 5.6 % Final  04/29/2019 03:32 PM 9.1 (A) 4.0 - 5.6 % Final  01/16/2017 12:00 AM 8.1  Final   Hgb A1c MFr Bld  Date/Time Value Ref Range Status  12/27/2019 04:34 AM 7.1 (H) 4.8 - 5.6 % Final    Comment:    (NOTE) Pre diabetes:          5.7%-6.4% Diabetes:              >6.4% Glycemic control for   <7.0% adults with diabetes   07/03/2015 02:55 PM 8.0 (H) 4.8 - 5.6 % Final    Comment:    (NOTE)         Pre-diabetes: 5.7 - 6.4  Diabetes: >6.4         Glycemic control for adults with diabetes: <7.0     CBG: Recent Labs  Lab 12/24/2019 2123 01/04/20 0723 01/04/20 1207 01/04/20 1629 01/04/20 2042  GLUCAP 174* 230* 218* 235* 207*    Review of Systems:   Unable to obtain secondary to AMS  Past Medical History  He,  has a past medical history of Acute on chronic respiratory failure with hypoxia and hypercapnia ([place]) (05/29/2019), Diabetes mellitus, Dialysis patient  [facility]), Discitis (07/2015), ESRD (end stage renal disease) on dialysis [facility]), Hypertension, RETINAL DETACHMENT, 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   Surgical History    Past Surgical History:  Procedure Laterality Date  . A/V FISTULAGRAM N/A 10/18/2018   Procedure: A/V FISTULAGRAM;  Surgeon: Marty Heck, MD;  Location: [place] [facility];  Service: Cardiovascular;  Laterality: N/A;  . A/V FISTULAGRAM Left 03/28/2019   Procedure: A/V Fistulagram;  Surgeon: Marty Heck, MD;  Location: [place] [facility];  Service: Cardiovascular;  Laterality: Left;  . AV FISTULA PLACEMENT  05/03/2012   Procedure: ARTERIOVENOUS (AV) FISTULA CREATION;  Surgeon: Mal [name], MD;  Location: [place];  Service: Vascular;  Laterality: Right;  . AV FISTULA PLACEMENT Left 10/23/2018   Procedure: ARTERIOVENOUS (AV) FISTULA CREATION;  Surgeon: Waynetta Sandy, MD;  Location: [place];  Service: Vascular;  Laterality: Left;  . BASCILIC VEIN TRANSPOSITION Left 04/02/2019   Procedure: BASILIC VEIN TRANSPOSITION SECOND STAGE LEFT;  Surgeon: Waynetta Sandy, MD;  Location: [place];  Service: Vascular;  Laterality: Left;  . INSERTION OF DIALYSIS CATHETER Left 12/03/2018   Procedure: INSERTION OF DIALYSIS CATHETER;  Surgeon: Marty Heck, MD;  Location: [place];  Service: Vascular;  Laterality: Left;  . INSERTION OF DIALYSIS CATHETER Left 06/03/2019   Procedure: INSERTION OF DIALYSIS CATHETER;  Surgeon: Marty Heck, MD;  [facility];  Service: Vascular;  Laterality: Left;  . IR REMOVAL TUN CV CATH W/O FL  12/27/2019  . ORIF MANDIBULAR FRACTURE N/A 11/10/2017   Procedure: OPEN REDUCTION INTERNAL FIXATION (ORIF) MANDIBULAR FRACTURE POSSIBLE MAXILLARY MANDIBULAR FIXATION;  Surgeon: Jodi Marble, MD;  Location: [place];  Service: ENT;  Laterality: N/A;  . REVISON OF ARTERIOVENOUS FISTULA Right 05/31/2018   Procedure: REVISION OF ARTERIOVENOUS FISTULA  RIGHT  ARM WITH INTERPOSITION ARTEGRAFT 6MM X 16CM GRAFT;  Surgeon: Waynetta Sandy, MD;  Location: [place];  Service: Vascular;  Laterality: Right;  . TEE WITHOUT CARDIOVERSION N/A 07/10/2015   Procedure: TRANSESOPHAGEAL ECHOCARDIOGRAM (TEE);  Surgeon: Josue Hector, MD;  Location: [place];  Service: Cardiovascular;  Laterality: N/A;  . TEE WITHOUT CARDIOVERSION N/A 12/07/2018   Procedure: TRANSESOPHAGEAL ECHOCARDIOGRAM (TEE);  Surgeon: Acie Fredrickson Wonda Cheng, MD;  Location: [place];  Service: Cardiovascular;  Laterality: N/A;  . TEE WITHOUT CARDIOVERSION N/A 06/03/2019   Procedure: Transesophageal Echocardiogram (Tee);  Surgeon: Acie Fredrickson Wonda Cheng, MD;  Location: [place];  Service: Cardiovascular;  Laterality: N/A;  . tracheostomy removal    . TRACHEOSTOMY TUBE PLACEMENT N/A 08/20/2013   Procedure: TRACHEOSTOMY Revision;  Surgeon: Melida Quitter, MD;  Location: [place];  Service: ENT;  Laterality: N/A;  . UPPER EXTREMITY VENOGRAPHY Bilateral 03/28/2019   Procedure: UPPER EXTREMITY VENOGRAPHY;  Surgeon: Marty Heck, MD;  Location: [place] [facility];  Service: Cardiovascular;  Laterality: Bilateral;     Social History   reports that he has never smoked. He has never used smokeless tobacco. He reports previous  drug use. Drug: Marijuana. He reports that he does not drink alcohol.   Family History   His family history includes Cancer in his mother; Diabetes in his brother, mother, and sister.   Allergies No Known Allergies   Home Medications  Prior to Admission medications   Medication Sig Start Date End Date Taking? Authorizing Provider  amLODipine (NORVASC) 10 MG tablet Take 1 tablet (10 mg total) by mouth daily. 08/21/19   Mitzi Hansen, MD  aspirin EC 81 MG tablet Take 81 mg by mouth daily.    [provider]  atorvastatin (LIPITOR) 40 MG tablet Take 1 tablet (40 mg total) by mouth daily at 6 PM. 11/26/19   Darrick Meigs, Rylee, MD  doxycycline (ADOXA) 100 MG tablet Take 1  tablet (100 mg total) by mouth 2 (two) times daily. Patient not taking: Reported on 12/22/2019 10/15/19   Ina Homes, MD  ferric citrate (AURYXIA) 1 GM 210 MG(Fe) tablet Take 420-840 mg by mouth See admin instructions. Take 4 tablets (840 mg) by mouth with meals and 2 tablets (420 mg) with snacks    [provider]  insulin degludec (TRESIBA FLEXTOUCH) 100 UNIT/ML SOPN FlexTouch Pen Inject 0.14 mLs (14 Units total) into the skin daily. 11/01/19   Mitzi Hansen, MD  Semaglutide,0.25 or 0.5MG /DOS, (OZEMPIC, 0.25 OR 0.5 MG/DOSE,) 2 MG/1.5ML SOPN Inject 0.25 mg into the skin once a week. 09/17/19   Ina Homes, MD  sertraline (ZOLOFT) 50 MG tablet Take 1 tablet (50 mg total) by mouth daily. Patient not taking: Reported on 12/22/2019 11/26/19   Mitzi Hansen, MD  traMADol (ULTRAM) 50 MG tablet Take 1 tablet (50 mg total) by mouth 2 (two) times daily. 11/06/19   Meredith Staggers, MD     Critical care time: 50 min      CRITICAL CARE Performed by: Otilio Carpen Doss Cybulski   Total critical care time: 50 minutes  Critical care time was exclusive of separately billable procedures and treating other patients.  Critical care was necessary to treat or prevent imminent or life-threatening deterioration.  Critical care was time spent personally by me on the following activities: development of treatment plan with patient and/or surrogate as well as nursing, discussions with consultants, evaluation of patient's response to treatment, examination of patient, obtaining history from patient or surrogate, ordering and performing treatments and interventions, ordering and review of laboratory studies, ordering and review of radiographic studies, pulse oximetry and re-evaluation of patient's condition.     Otilio Carpen Levern Kalka, PA-C

## 2020-01-04 NOTE — Progress Notes (Addendum)
Patient took off his nasal cannula and RN found his O2 sats in the 35s. Placed back on CPAP to help him recuperate. He was arousable to voice when RN came in and was alert to self. O2 increased to >88% within less than 5 mins of placing him on CPAP.

## 2020-01-05 ENCOUNTER — Inpatient Hospital Stay (HOSPITAL_COMMUNITY): Payer: Medicare Other

## 2020-01-05 DIAGNOSIS — Z9911 Dependence on respirator [ventilator] status: Secondary | ICD-10-CM

## 2020-01-05 DIAGNOSIS — M25461 Effusion, right knee: Secondary | ICD-10-CM | POA: Diagnosis not present

## 2020-01-05 DIAGNOSIS — N186 End stage renal disease: Secondary | ICD-10-CM | POA: Diagnosis not present

## 2020-01-05 DIAGNOSIS — B9562 Methicillin resistant Staphylococcus aureus infection as the cause of diseases classified elsewhere: Secondary | ICD-10-CM | POA: Diagnosis not present

## 2020-01-05 DIAGNOSIS — R7881 Bacteremia: Secondary | ICD-10-CM | POA: Diagnosis not present

## 2020-01-05 DIAGNOSIS — Z8614 Personal history of Methicillin resistant Staphylococcus aureus infection: Secondary | ICD-10-CM

## 2020-01-05 LAB — RENAL FUNCTION PANEL
Albumin: 1.7 g/dL — ABNORMAL LOW (ref 3.5–5.0)
Anion gap: 23 — ABNORMAL HIGH (ref 5–15)
BUN: 60 mg/dL — ABNORMAL HIGH (ref 6–20)
CO2: 18 mmol/L — ABNORMAL LOW (ref 22–32)
Calcium: 8.8 mg/dL — ABNORMAL LOW (ref 8.9–10.3)
Chloride: 92 mmol/L — ABNORMAL LOW (ref 98–111)
Creatinine, Ser: 6.02 mg/dL — ABNORMAL HIGH (ref 0.61–1.24)
GFR calc Af Amer: 11 mL/min — ABNORMAL LOW (ref >60)
GFR calc non Af Amer: 10 mL/min — ABNORMAL LOW (ref >60)
Glucose, Bld: 320 mg/dL — ABNORMAL HIGH (ref 70–99)
Phosphorus: 6.5 mg/dL — ABNORMAL HIGH (ref 2.5–4.6)
Potassium: 5 mmol/L (ref 3.5–5.1)
Sodium: 133 mmol/L — ABNORMAL LOW (ref 135–145)

## 2020-01-05 LAB — COMPREHENSIVE METABOLIC PANEL
ALT: 35 U/L (ref 0–44)
AST: 95 U/L — ABNORMAL HIGH (ref 15–41)
Albumin: 1.8 g/dL — ABNORMAL LOW (ref 3.5–5.0)
Alkaline Phosphatase: 279 U/L — ABNORMAL HIGH (ref 38–126)
Anion gap: 19 — ABNORMAL HIGH (ref 5–15)
BUN: 55 mg/dL — ABNORMAL HIGH (ref 6–20)
CO2: 22 mmol/L (ref 22–32)
Calcium: 9 mg/dL (ref 8.9–10.3)
Chloride: 95 mmol/L — ABNORMAL LOW (ref 98–111)
Creatinine, Ser: 5.9 mg/dL — ABNORMAL HIGH (ref 0.61–1.24)
GFR calc Af Amer: 11 mL/min — ABNORMAL LOW (ref >60)
GFR calc non Af Amer: 10 mL/min — ABNORMAL LOW (ref >60)
Glucose, Bld: 240 mg/dL — ABNORMAL HIGH (ref 70–99)
Potassium: 4.8 mmol/L (ref 3.5–5.1)
Sodium: 136 mmol/L (ref 135–145)
Total Bilirubin: 2 mg/dL — ABNORMAL HIGH (ref 0.3–1.2)
Total Protein: 8.3 g/dL — ABNORMAL HIGH (ref 6.5–8.1)

## 2020-01-05 LAB — PHOSPHORUS: Phosphorus: 5.5 mg/dL — ABNORMAL HIGH (ref 2.5–4.6)

## 2020-01-05 LAB — GLUCOSE, CAPILLARY
Glucose-Capillary: 278 mg/dL — ABNORMAL HIGH (ref 70–99)
Glucose-Capillary: 291 mg/dL — ABNORMAL HIGH (ref 70–99)
Glucose-Capillary: 295 mg/dL — ABNORMAL HIGH (ref 70–99)
Glucose-Capillary: 304 mg/dL — ABNORMAL HIGH (ref 70–99)
Glucose-Capillary: 345 mg/dL — ABNORMAL HIGH (ref 70–99)
Glucose-Capillary: 360 mg/dL — ABNORMAL HIGH (ref 70–99)
Glucose-Capillary: 365 mg/dL — ABNORMAL HIGH (ref 70–99)
Glucose-Capillary: 372 mg/dL — ABNORMAL HIGH (ref 70–99)

## 2020-01-05 LAB — BLOOD GAS, VENOUS
Acid-base deficit: 8.4 mmol/L — ABNORMAL HIGH (ref 0.0–2.0)
Bicarbonate: 17.7 mmol/L — ABNORMAL LOW (ref 20.0–28.0)
FIO2: 60
O2 Saturation: 64.7 %
Patient temperature: 37
pCO2, Ven: 43.1 mmHg — ABNORMAL LOW (ref 44.0–60.0)
pH, Ven: 7.238 — ABNORMAL LOW (ref 7.250–7.430)
pO2, Ven: 43.2 mmHg (ref 32.0–45.0)

## 2020-01-05 LAB — CBC WITH DIFFERENTIAL/PLATELET
Abs Immature Granulocytes: 1.89 10*3/uL — ABNORMAL HIGH (ref 0.00–0.07)
Basophils Absolute: 0.2 10*3/uL — ABNORMAL HIGH (ref 0.0–0.1)
Basophils Relative: 1 %
Eosinophils Absolute: 0.3 10*3/uL (ref 0.0–0.5)
Eosinophils Relative: 1 %
HCT: 23.7 % — ABNORMAL LOW (ref 39.0–52.0)
Hemoglobin: 7.3 g/dL — ABNORMAL LOW (ref 13.0–17.0)
Immature Granulocytes: 7 %
Lymphocytes Relative: 7 %
Lymphs Abs: 2 10*3/uL (ref 0.7–4.0)
MCH: 28.5 pg (ref 26.0–34.0)
MCHC: 30.8 g/dL (ref 30.0–36.0)
MCV: 92.6 fL (ref 80.0–100.0)
Monocytes Absolute: 1.5 10*3/uL — ABNORMAL HIGH (ref 0.1–1.0)
Monocytes Relative: 5 %
Neutro Abs: 22.5 10*3/uL — ABNORMAL HIGH (ref 1.7–7.7)
Neutrophils Relative %: 79 %
Platelets: 344 10*3/uL (ref 150–400)
RBC: 2.56 MIL/uL — ABNORMAL LOW (ref 4.22–5.81)
RDW: 19.6 % — ABNORMAL HIGH (ref 11.5–15.5)
WBC: 28.4 10*3/uL — ABNORMAL HIGH (ref 4.0–10.5)
nRBC: 2.1 % — ABNORMAL HIGH (ref 0.0–0.2)

## 2020-01-05 LAB — MAGNESIUM: Magnesium: 2 mg/dL (ref 1.7–2.4)

## 2020-01-05 LAB — LACTIC ACID, PLASMA
Lactic Acid, Venous: 1.9 mmol/L (ref 0.5–1.9)
Lactic Acid, Venous: 3.3 mmol/L (ref 0.5–1.9)

## 2020-01-05 LAB — C-REACTIVE PROTEIN: CRP: 43.1 mg/dL — ABNORMAL HIGH (ref <1.0)

## 2020-01-05 LAB — CULTURE, BLOOD (ROUTINE X 2)
Culture: NO GROWTH
Special Requests: ADEQUATE

## 2020-01-05 LAB — SEDIMENTATION RATE: Sed Rate: >140 mm/hr — ABNORMAL HIGH (ref 0–16)

## 2020-01-05 LAB — TRIGLYCERIDES: Triglycerides: 173 mg/dL — ABNORMAL HIGH (ref <150)

## 2020-01-05 MED ORDER — INSULIN GLARGINE 100 UNIT/ML ~~LOC~~ SOLN
10.0000 [IU] | Freq: Every day | SUBCUTANEOUS | Status: DC
  Administered 2020-01-05 – 2020-01-06 (×2): 10 [IU] via SUBCUTANEOUS
  Filled 2020-01-05 (×2): qty 0.1

## 2020-01-05 MED ORDER — MIDAZOLAM HCL 2 MG/2ML IJ SOLN
INTRAMUSCULAR | Status: AC
  Administered 2020-01-05: 2 mg
  Filled 2020-01-05: qty 2

## 2020-01-05 MED ORDER — INSULIN ASPART 100 UNIT/ML ~~LOC~~ SOLN
0.0000 [IU] | SUBCUTANEOUS | Status: DC
  Administered 2020-01-05: 20 [IU] via SUBCUTANEOUS
  Administered 2020-01-05: 11 [IU] via SUBCUTANEOUS
  Administered 2020-01-05: 7 [IU] via SUBCUTANEOUS
  Administered 2020-01-05: 20 [IU] via SUBCUTANEOUS
  Administered 2020-01-06: 11 [IU] via SUBCUTANEOUS
  Administered 2020-01-06: 16:00:00 15 [IU] via SUBCUTANEOUS
  Administered 2020-01-06: 11 [IU] via SUBCUTANEOUS
  Administered 2020-01-06: 12:00:00 15 [IU] via SUBCUTANEOUS

## 2020-01-05 MED ORDER — PANTOPRAZOLE SODIUM 40 MG IV SOLR
40.0000 mg | INTRAVENOUS | Status: DC
  Administered 2020-01-05 – 2020-01-06 (×2): 40 mg via INTRAVENOUS
  Filled 2020-01-05 (×2): qty 40

## 2020-01-05 MED ORDER — ORAL CARE MOUTH RINSE
15.0000 mL | OROMUCOSAL | Status: DC
  Administered 2020-01-05 – 2020-01-06 (×15): 15 mL via OROMUCOSAL

## 2020-01-05 MED ORDER — ROCURONIUM BROMIDE 50 MG/5ML IV SOLN
100.0000 mg | Freq: Once | INTRAVENOUS | Status: AC
  Administered 2020-01-04: 100 mg via INTRAVENOUS

## 2020-01-05 MED ORDER — INSULIN ASPART 100 UNIT/ML ~~LOC~~ SOLN
0.0000 [IU] | SUBCUTANEOUS | Status: DC
  Administered 2020-01-05: 09:00:00 4 [IU] via SUBCUTANEOUS
  Administered 2020-01-05: 05:00:00 3 [IU] via SUBCUTANEOUS

## 2020-01-05 MED ORDER — ETOMIDATE 2 MG/ML IV SOLN
20.0000 mg | Freq: Once | INTRAVENOUS | Status: AC
  Administered 2020-01-04: 20 mg via INTRAVENOUS

## 2020-01-05 MED ORDER — SODIUM CHLORIDE 0.9 % IV BOLUS
250.0000 mL | Freq: Once | INTRAVENOUS | Status: AC
  Administered 2020-01-05: 250 mL via INTRAVENOUS

## 2020-01-05 MED ORDER — CHLORHEXIDINE GLUCONATE 0.12% ORAL RINSE (MEDLINE KIT)
15.0000 mL | Freq: Two times a day (BID) | OROMUCOSAL | Status: DC
  Administered 2020-01-05 – 2020-01-06 (×4): 15 mL via OROMUCOSAL

## 2020-01-05 NOTE — Progress Notes (Signed)
NAME:  Seth [name], MRN:  [id number], DOB:  1966/04/08, LOS: 9 ADMISSION DATE:  01/05/2020, CONSULTATION DATE:  3/19 REFERRING MD:  Dr. Ralene Bathe EDP, CHIEF COMPLAINT:  AMS   Brief History   54 year old male with MMP presented with back pain and AMS 3/19 and was admitted with presumed septic shock from unclear source, but possibly bacteremic from tunneled HD catheter.   History of present illness   54 year old male with PMH as below, which is significant for ESRD on HD, chronic hypoxemic respiratory failure no home O2, OSA on CPAP, DM, discitis, and cord compression myelopathy. Most recent HD was 3/17. He left the session 45 mins early due to back pain. He was in his usual state of health until approximately 3/11 when he developed muscle aches and back pain. He had just received his 1st COVID-19 vaccine a few days prior and it was felt these symptoms were secondary to this. He has been seen twice in the ED in the past week. First for complaints of febrile illness. He was not given any antibiotics at that time. He then returned 3/18 with complaints of myalgias. He returned to ED later in the day 3/18 via EMS after being found to have altered mental status at home. Upon arrival to ED he was noted to be hypotensive with SBP in the 60s and hypoxemic with O2 sats in the 70s on room air. Improved with supplemental O2 and levophed infusion after 286mL bolus. There were also concerns he had been unable to use his legs the past few days and MRI spine was ordered. PCCM asked to evaluate for admission due to need for vasoactive infusion.    Pt was treated with pressors and antibiotics, he improved and was transferred out of ICU on 3/2.  Blood cultures were positive for MRSA. 2D echo negative for vegetations, pt refused TEE.  Pt also noted to have swollen and painful R knee.  Seen by ID and treated with Vancomycin.     Per nursing, pt was fairly awake and oriented earlier today and eating meals.  Around 4pm  became more somnolent with worsening respiratory status, hypotension and fever to 102F.  Repeat CXR showed increased central vascular congesion and new RML consolidation favored to be atelectasis.  PCCM consulted for worsening respiratory status and patient was transferred to the ICU and intubated.  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   Significant Hospital Events   3/18 admit  Consults:  PCCM Nephrology  Procedures:  3/27 L femoral CVC 3/27 ETT  Significant Diagnostic Tests:  CTA chest 3/18 > No central pulmonary embolism. Multifocal bilateral pulmonary peripheral nodularities, the larger with central cavitation. 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 Stable T10 lytic lesion, likely a benign hemangioma. T11-L1 smaller lytic lesions, possibly degenerative. MRI spine 3/19 > 3/28 CT head>> no acute intracranial abnormaliity   Micro Data:  Blood cultures 3/18 >MRSA 3/28 BCx2>> 3/28 Respiratory culture>>  Antimicrobials:  Zosyn 3/18 > Vancomycin 3/18 > Cefepime 3/27>   Interim history/subjective:  Weaning pressors this morning  Weaning sedation as able  Aspiration of R knee overnight  with serosang fluid sent for cell count, cx   Tmax  102.4  Objective   Blood pressure (!) 132/57, pulse 93, temperature (!) 101.6 F (38.7 C), temperature source Oral, resp. rate (!) 33, height 5\' 8"  (1.727 m), weight 119.2 kg, SpO2 96 %.    Vent Mode: PRVC FiO2 (%):  [50 %-100 %] 50 % Set Rate:  [15 bmp-16 bmp] 16 bmp Vt Set:  [530 mL] 530 mL PEEP:  [5  cmH20-6 cmH20] 5 cmH20 Plateau Pressure:  [20 cmH20-21 cmH20] 20 cmH20   Intake/Output Summary (Last 24 hours) at 01/05/2020 0623 Last data filed at 01/05/2020 0801 Gross per 24 hour  Intake 617.49 ml  Output 50 ml  Net 567.49 ml   Filed Weights   12/16/2019 0500 12/29/2019 0915 01/05/20 0500  Weight: 123.2 kg 123.2 kg 119.2 kg    General:  Obese, chronically ill appearing middle aged M, intubated, sedated, critically ill appearing.  HEENT:NCAT pink mmm. ETT secure trachea midline  Neuro: Sedated, RASS -1. PERRL 97mm.  CV: tachycardic rate regular rhythm s1s2 no rgm, cap refill < 3 seconds  PULM:  Diminished bibasilar sounds, scattered bilateral rhonchi. Overbreathing vent.  GI: Mildly distended. Non-tender. Normoactive x4  Extremities: Symmetrically decreased muscle bulk BLE. No obvious joint deformity. No cyanosis or clubbing.  Skin: C/d/warm to touch.    Resolved Hospital Problem list     Assessment & Plan:   Acute on chronic hypoxic respiratory failure -failed trial of CPAP and required intubation -CXR with new RML, possibly atelectasis -goals of care discussed with patient's sister who is primary decision maker, stated that she would like a trial of ventilator support, though if patient is not improving does not think he would want prolonged life support P: -WUA/SBT qAM  -titrate Vent setting to maintain SpO2 greater than or equal to 90%. -VAP, PAD.  Shock, likely septic secondary to MRSA bacteremia -requiring levophed and vasopressin after 1.5L IVF  -initially thought to be due to infected dialysis catheter  -R knee erythemetous, consider septic joint -ID following P: -continue Vasopressin and Levophed to maintain MAP >65 -on stress dose steroids -cont vanc, cefepime -follow up cx data: bcx, tracheal aspirate, R knee cell count, cx -ortho consulted for possible septic joint -Consider TEE now that pt is intubated; I see ID is following and previously pt has refused TEE    Encephalopathy, suspect driven by hypercarbia, sepsis -possibly precipitated by worsening sepsis with respiratory failure  -pH 7.29 and pCO2 57 -improving 3/28 -CT H without acute abnormality  P: -delirium precautions -sepsis, resp failure as above  ESRD -nephrology following, M/W/F schedule P: -next dialysis 3/29, monitor electrolytes -if remains on pressors, may need CRRT and HD cath   Chronic Anemia -Hgb drop to 7.6 tonight, no sign of active bleeding P: -trend CBC -goal hgb >7, no indication for transfusion at this time   Type 2 DM -changing to resistant SSI from sensitive SSI  -may need addition of basal   Best practice:  Diet: NPO Pain/Anxiety/Delirium protocol (if indicated): propofol VAP protocol (if indicated): yes DVT prophylaxis: heparin GI prophylaxis: protonix Glucose control: rSSI Mobility: BR Code Status: FULL Family Communication: Pending 3/28 Disposition: ICU  Labs   CBC: Recent Labs  Lab 12/22/2019 0245 12/23/2019 0245 01/02/20 0054 01/02/20 0054 01/02/20 1607 12/13/2019 0333 01/04/20 0328 01/04/20 2327 01/05/20 0327  WBC 32.5*   < > 41.6*   < > 35.6* 36.0* 29.7* 29.9* 28.4*  NEUTROABS 25.1*  --  33.2*  --   --  28.8* 24.0*  --  22.5*  HGB 9.0*   < > 8.9*   < > 8.6* 8.5* 7.6* 7.6* 7.3*  HCT 26.7*   < > 27.0*   < > 26.0* 26.6* 24.7* 23.8* 23.7*  MCV 85.3   < > 87.9   < > 88.4 88.7 91.5 90.8 92.6  PLT 376   < > 373   < > 354 354 328 350 344   < > = values in this interval not displayed.    Basic Metabolic Panel: Recent Labs  Lab 01/02/20 0054 01/02/20 0054 01/02/20 1607 01/06/2020 0333 01/04/20 0328 01/04/20 2327 01/05/20 0327  NA 132*   < > 134* 135 134* 136 133*  K 5.1   < > 4.0 4.4 4.1 4.8 5.0  CL 93*   < > 95* 96* 93* 95* 92*  CO2 17*   < > 23 22 22 22  18*  GLUCOSE 158*   < > 178* 186* 207* 240* 320*  BUN 27*   < > 49* 61* 36* 55* 60*  CREATININE 3.36*   < > 5.02* 5.79* 4.24* 5.90* 6.02*  CALCIUM 9.2   < > 9.3 9.0 9.0 9.0  8.8*  MG  --   --   --   --   --  2.0  --   PHOS 4.7*  --   --  6.0* 4.7* 5.5* 6.5*   < > = values in this interval not displayed.   GFR: Estimated Creatinine Clearance: 17.4 mL/min (A) (by C-G formula based on SCr of 6.02 mg/dL (H)). Recent Labs  Lab 01/02/20 1607 01/02/20 1704 01/02/20 1815 01/06/2020 0333 01/04/20 0328 01/04/20 2327 01/05/20 0327  WBC   < >  --   --  36.0* 29.7* 29.9* 28.4*  LATICACIDVEN  --  1.9 1.5  --   --  1.9 3.3*   < > = values in this interval not displayed.    Liver Function Tests: Recent Labs  Lab 01/02/20 1607 12/21/2019 0333 01/04/20 0328 01/04/20 2327 01/05/20 0327  AST 87*  --   --  95*  --   ALT 30  --   --  35  --   ALKPHOS 256*  --   --  279*  --   BILITOT 1.3*  --   --  2.0*  --   PROT 7.7  --   --  8.3*  --   ALBUMIN 1.7* 1.7* 1.7* 1.8* 1.7*   No results for input(s): LIPASE, AMYLASE in the last 168 hours. Recent Labs  Lab 01/02/20 1704  AMMONIA 31    ABG    Component Value Date/Time   PHART 7.290 (L) 01/04/2020 1907   PCO2ART 57.8 (H) 01/04/2020 1907   PO2ART 52.7 (L) 01/04/2020 1907   HCO3 17.7 (L) 01/05/2020 0526   TCO2 22 12/24/2019 2245   ACIDBASEDEF 8.4 (H) 01/05/2020 0526   O2SAT 64.7 01/05/2020 0526     Coagulation Profile: No results for input(s): INR, PROTIME in the last 168 hours.  Cardiac Enzymes: No results for input(s): CKTOTAL, CKMB, CKMBINDEX, TROPONINI in the last 168 hours.  HbA1C: Hemoglobin A1C  Date/Time Value Ref Range Status  08/15/2019 02:41 PM 7.0 (A) 4.0 - 5.6 % Final  04/29/2019 03:32 PM 9.1 (A) 4.0 - 5.6 % Final  01/16/2017 12:00 AM 8.1  Final   Hgb A1c MFr Bld  Date/Time Value Ref Range Status  12/27/2019 04:34 AM 7.1 (H) 4.8 - 5.6 % Final    Comment:    (  NOTE) Pre diabetes:          5.7%-6.4% Diabetes:              >6.4% Glycemic control for   <7.0% adults with diabetes   07/03/2015 02:55 PM 8.0 (H) 4.8 - 5.6 % Final    Comment:    (NOTE)         Pre-diabetes: 5.7 - 6.4          Diabetes: >6.4         Glycemic control for adults with diabetes: <7.0     CBG: Recent Labs  Lab 01/04/20 1629 01/04/20 2042 01/05/20 0031 01/05/20 0455 01/05/20 0811  GLUCAP 235* 207* 278* 304* 345*     CRITICAL CARE Performed by: Cristal Generous  Total critical care time: 38 minutes  Critical care time was exclusive of separately billable procedures and treating other patients. Critical care was necessary to treat or prevent imminent or life-threatening deterioration.  Critical care was time spent personally by me on the following activities: development of treatment plan with patient and/or surrogate as well as nursing, discussions with consultants, evaluation of patient's response to treatment, examination of patient, obtaining history from patient or surrogate, ordering and performing treatments and interventions, ordering and review of laboratory studies, ordering and review of radiographic studies, pulse oximetry and re-evaluation of patient's condition.  Eliseo Gum MSN, AGACNP-BC [place] [phone] If no answer, [phone] 01/05/2020, 8:28 AM

## 2020-01-05 NOTE — Progress Notes (Signed)
PCCM interval progress:  R knee aspirated, ~1cc cloudy, blood tinged serosanguineous fluid obtained and sent to lab for cell count and culture.  Otilio Carpen Madaline Lefeber, PA-C

## 2020-01-05 NOTE — Progress Notes (Signed)
CRITICAL VALUE ALERT  Critical Value:  Lacric acid 3.3  Date & Time Notied:  01/05/2020, 0330  Provider Notified: E-link

## 2020-01-05 NOTE — Consult Note (Signed)
[facility] (OTS) Consult   [name] MRN: [id number] DOB/AGE: Aug 29, 1966 54 y.o.  Reason for Consult:Possible septic knee Referring Physician: Eliseo Gum, NP Critical Care  HPI: Seth [name] is an 54 y.o. male who is being seen in consultation at the request of nurse practitioner Bowser for evaluation of possible right septic knee.  Patient has a history of multiple medical problems including MRSA bacteremia.  The patient was intubated upon examination and so most of the history is obtained through chart review.  The patient had an acute decompensation what sounds like this morning and required intubation.  There was concern about an effusion on his right knee with pain and tenderness and as result an aspiration was performed.  Details from the aspiration show about 1 mL of serosanguineous fluid was sent off for culture and lab.  There is no cell count in the medical record likely because there is not enough fluid to run the culture and the cell count.  I was called earlier today to evaluate possible need for I&D and for evaluation of his knee.  Past Medical History:  Diagnosis Date  . Acute on chronic respiratory failure with hypoxia and hypercapnia ([place]) 05/29/2019  . Diabetes mellitus    INSULIN DEPENDENT DIABETES  . Dialysis patient ([place])   . 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 ESRD (end stage renal disease) on dialysis [facility])    [facility] M/W/F  . Hypertension   . 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 Sleep apnea    USES CPAP  . Type 2 diabetes mellitus ([place])     Past Surgical History:  Procedure Laterality Date  . A/V FISTULAGRAM N/A 10/18/2018   Procedure: A/V FISTULAGRAM;  Surgeon: Marty Heck, MD;  Location: [place] [facility];  Service: Cardiovascular;  Laterality: N/A;  . A/V FISTULAGRAM Left 03/28/2019   Procedure: A/V Fistulagram;  Surgeon: Marty Heck,  MD;  Location: [place] [facility];  Service: Cardiovascular;  Laterality: Left;  . AV FISTULA PLACEMENT  05/03/2012   Procedure: ARTERIOVENOUS (AV) FISTULA CREATION;  Surgeon: Mal [name], MD;  Location: [place];  Service: Vascular;  Laterality: Right;  . AV FISTULA PLACEMENT Left 10/23/2018   Procedure: ARTERIOVENOUS (AV) FISTULA CREATION;  Surgeon: Waynetta Sandy, MD;  Location: [place];  Service: Vascular;  Laterality: Left;  . BASCILIC VEIN TRANSPOSITION Left 04/02/2019   Procedure: BASILIC VEIN TRANSPOSITION SECOND STAGE LEFT;  Surgeon: Waynetta Sandy, MD;  Location: [place];  Service: Vascular;  Laterality: Left;  . INSERTION OF DIALYSIS CATHETER Left 12/03/2018   Procedure: INSERTION OF DIALYSIS CATHETER;  Surgeon: Marty Heck, MD;  Location: [place];  Service: Vascular;  Laterality: Left;  . INSERTION OF DIALYSIS CATHETER Left 06/03/2019   Procedure: INSERTION OF DIALYSIS CATHETER;  Surgeon: Marty Heck, MD;  Location: [facility] OR;  Service: Vascular;  Laterality: Left;  . IR REMOVAL TUN CV CATH W/O FL  12/27/2019  . ORIF MANDIBULAR FRACTURE N/A 11/10/2017   Procedure: OPEN REDUCTION INTERNAL FIXATION (ORIF) MANDIBULAR FRACTURE POSSIBLE MAXILLARY MANDIBULAR FIXATION;  Surgeon: Jodi Marble, MD;  Location: [place];  Service: ENT;  Laterality: N/A;  . REVISON OF ARTERIOVENOUS FISTULA Right 05/31/2018   Procedure: REVISION OF ARTERIOVENOUS FISTULA  RIGHT ARM WITH INTERPOSITION ARTEGRAFT 6MM X 16CM GRAFT;  Surgeon: Waynetta Sandy, MD;  Location: [place];  Service: Vascular;  Laterality: Right;  .  TEE WITHOUT CARDIOVERSION N/A 07/10/2015   Procedure: TRANSESOPHAGEAL ECHOCARDIOGRAM (TEE);  Surgeon: Josue Hector, MD;  Location: [place];  Service: Cardiovascular;  Laterality: N/A;  . TEE WITHOUT CARDIOVERSION N/A 12/07/2018   Procedure: TRANSESOPHAGEAL ECHOCARDIOGRAM (TEE);  Surgeon: Acie Fredrickson Wonda Cheng, MD;  Location: [place];  Service: Cardiovascular;   Laterality: N/A;  . TEE WITHOUT CARDIOVERSION N/A 06/03/2019   Procedure: Transesophageal Echocardiogram (Tee);  Surgeon: Acie Fredrickson Wonda Cheng, MD;  Location: [place];  Service: Cardiovascular;  Laterality: N/A;  . tracheostomy removal    . TRACHEOSTOMY TUBE PLACEMENT N/A 08/20/2013   Procedure: TRACHEOSTOMY Revision;  Surgeon: Melida Quitter, MD;  Location: [place];  Service: ENT;  Laterality: N/A;  . UPPER EXTREMITY VENOGRAPHY Bilateral 03/28/2019   Procedure: UPPER EXTREMITY VENOGRAPHY;  Surgeon: Marty Heck, MD;  Location: [place] [facility];  Service: Cardiovascular;  Laterality: Bilateral;    Family History  Problem Relation Age of Onset  . Diabetes Mother   . Cancer Mother   . Diabetes Sister   . Diabetes Brother     Social History:  reports that he has never smoked. He has never used smokeless tobacco. He reports previous drug use. Drug: Marijuana. He reports that he does not drink alcohol.  Allergies: No Known Allergies  Medications:  No current facility-administered medications on file prior to encounter.   Current Outpatient Medications on File Prior to Encounter  Medication Sig Dispense Refill  . amLODipine (NORVASC) 10 MG tablet Take 1 tablet (10 mg total) by mouth daily. 90 tablet 1  . aspirin EC 81 MG tablet Take 81 mg by mouth daily.    . ferric citrate (AURYXIA) 1 GM 210 MG(Fe) tablet Take 420-840 mg by mouth See admin instructions. Take 4 tablets (840 mg) by mouth with meals and 2 tablets (420 mg) with snacks    . insulin degludec (TRESIBA FLEXTOUCH) 100 UNIT/ML SOPN FlexTouch Pen Inject 0.14 mLs (14 Units total) into the skin daily. 2 pen 3  . Semaglutide,0.25 or 0.5MG /DOS, (OZEMPIC, 0.25 OR 0.5 MG/DOSE,) 2 MG/1.5ML SOPN Inject 0.25 mg into the skin once a week. 4.5 mL 3  . traMADol (ULTRAM) 50 MG tablet Take 1 tablet (50 mg total) by mouth 2 (two) times daily. 60 tablet 0  . atorvastatin (LIPITOR) 40 MG tablet Take 1 tablet (40 mg total) by mouth daily at 6 PM.  (Patient not taking: Reported on 12/27/2019) 90 tablet 3  . doxycycline (ADOXA) 100 MG tablet Take 1 tablet (100 mg total) by mouth 2 (two) times daily. (Patient not taking: Reported on 12/22/2019) 10 tablet 0  . sertraline (ZOLOFT) 50 MG tablet Take 1 tablet (50 mg total) by mouth daily. (Patient not taking: Reported on 12/22/2019) 30 tablet 0   ROS: Unable to obtain secondary to intubation and sedation  Exam: Blood pressure (!) 125/57, pulse 86, temperature (!) 100.5 F (38.1 C), temperature source Axillary, resp. rate (!) 33, height 5\' 8"  (1.727 m), weight 119.2 kg, SpO2 95 %. General: Intubated and sedated Orientation: Intubated and sedated Mood and Affect: Unable to assess due to intubation and sedation Gait: Unable to assess Coordination and balance: Unable to assess  Right lower extremity: Knee is flexed and hip is flexed as well.  The patient has no acute lesions.  No draining wounds.  His knee is not warm.  He does grimace and withdraw with attempted flexion extension of the knee.  The patella is palpable with minimal knee effusion.  There is no fluctuance on my  exam either.  He is unable to cooperate with neurologic exam.  He has a warm well-perfused foot.  He does have chronic changes consistent with diabetes of his foot.  Left lower extremity: Knee is flexed but is not as flexed as the right side.  No effusion on exam, he does grimace and withdraw with attempted range of motion of the left knee.  No other significant clinical findings on exam of the left lower extremity.   Medical Decision Making: Data: Imaging: No imaging performed on the left or right knee  Labs:  Results for orders placed or performed during the hospital encounter of 12/21/2019 (from the past 24 hour(s))  Glucose, capillary     Status: Abnormal   Collection Time: 01/04/20  4:29 PM  Result Value Ref Range   Glucose-Capillary 235 (H) 70 - 99 mg/dL  Blood gas, arterial     Status: Abnormal   Collection Time:  01/04/20  7:07 PM  Result Value Ref Range   FIO2 60.00    pH, Arterial 7.290 (L) 7.350 - 7.450   pCO2 arterial 57.8 (H) 32.0 - 48.0 mmHg   pO2, Arterial 52.7 (L) 83.0 - 108.0 mmHg   Bicarbonate 26.1 20.0 - 28.0 mmol/L   Acid-Base Excess 0.7 0.0 - 2.0 mmol/L   O2 Saturation 75.2 %   Patient temperature 39.1    Collection site RIGHT RADIAL    Drawn by [id number]    Sample type ARTERIAL    Allens test (pass/fail) PASS PASS  Glucose, capillary     Status: Abnormal   Collection Time: 01/04/20  8:42 PM  Result Value Ref Range   Glucose-Capillary 207 (H) 70 - 99 mg/dL  CBC     Status: Abnormal   Collection Time: 01/04/20 11:27 PM  Result Value Ref Range   WBC 29.9 (H) 4.0 - 10.5 K/uL   RBC 2.62 (L) 4.22 - 5.81 MIL/uL   Hemoglobin 7.6 (L) 13.0 - 17.0 g/dL   HCT 23.8 (L) 39.0 - 52.0 %   MCV 90.8 80.0 - 100.0 fL   MCH 29.0 26.0 - 34.0 pg   MCHC 31.9 30.0 - 36.0 g/dL   RDW 18.8 (H) 11.5 - 15.5 %   Platelets 350 150 - 400 K/uL   nRBC 1.4 (H) 0.0 - 0.2 %  Comprehensive metabolic panel     Status: Abnormal   Collection Time: 01/04/20 11:27 PM  Result Value Ref Range   Sodium 136 135 - 145 mmol/L   Potassium 4.8 3.5 - 5.1 mmol/L   Chloride 95 (L) 98 - 111 mmol/L   CO2 22 22 - 32 mmol/L   Glucose, Bld 240 (H) 70 - 99 mg/dL   BUN 55 (H) 6 - 20 mg/dL   Creatinine, Ser 5.90 (H) 0.61 - 1.24 mg/dL   Calcium 9.0 8.9 - 10.3 mg/dL   Total Protein 8.3 (H) 6.5 - 8.1 g/dL   Albumin 1.8 (L) 3.5 - 5.0 g/dL   AST 95 (H) 15 - 41 U/L   ALT 35 0 - 44 U/L   Alkaline Phosphatase 279 (H) 38 - 126 U/L   Total Bilirubin 2.0 (H) 0.3 - 1.2 mg/dL   GFR calc non Af Amer 10 (L) >60 mL/min   GFR calc Af Amer 11 (L) >60 mL/min   Anion gap 19 (H) 5 - 15  Magnesium     Status: None   Collection Time: 01/04/20 11:27 PM  Result Value Ref Range   Magnesium 2.0 1.7 - 2.4  mg/dL  Phosphorus     Status: Abnormal   Collection Time: 01/04/20 11:27 PM  Result Value Ref Range   Phosphorus 5.5 (H) 2.5 - 4.6 mg/dL  Lactic  acid, plasma     Status: None   Collection Time: 01/04/20 11:27 PM  Result Value Ref Range   Lactic Acid, Venous 1.9 0.5 - 1.9 mmol/L  C-reactive protein     Status: Abnormal   Collection Time: 01/04/20 11:27 PM  Result Value Ref Range   CRP 43.1 (H) <1.0 mg/dL  Sedimentation rate     Status: Abnormal   Collection Time: 01/04/20 11:27 PM  Result Value Ref Range   Sed Rate >140 (H) 0 - 16 mm/hr  Glucose, capillary     Status: Abnormal   Collection Time: 01/05/20 12:31 AM  Result Value Ref Range   Glucose-Capillary 278 (H) 70 - 99 mg/dL  Culture, blood (routine x 2)     Status: None (Preliminary result)   Collection Time: 01/05/20  2:32 AM   Specimen: BLOOD  Result Value Ref Range   Specimen Description BLOOD RIGHT HAND    Special Requests      BOTTLES DRAWN AEROBIC AND ANAEROBIC Blood Culture adequate volume   Culture      NO GROWTH <12 HOURS Performed at [facility], [address] [place], [place] [address]    Report Status PENDING   Culture, blood (routine x 2)     Status: None (Preliminary result)   Collection Time: 01/05/20  2:33 AM   Specimen: BLOOD  Result Value Ref Range   Specimen Description BLOOD RIGHT HAND    Special Requests      BOTTLES DRAWN AEROBIC AND ANAEROBIC Blood Culture adequate volume   Culture      NO GROWTH <12 HOURS Performed at [facility], [place] [address] [place], [place] [address]    Report Status PENDING   Renal function panel     Status: Abnormal   Collection Time: 01/05/20  3:27 AM  Result Value Ref Range   Sodium 133 (L) 135 - 145 mmol/L   Potassium 5.0 3.5 - 5.1 mmol/L   Chloride 92 (L) 98 - 111 mmol/L   CO2 18 (L) 22 - 32 mmol/L   Glucose, Bld 320 (H) 70 - 99 mg/dL   BUN 60 (H) 6 - 20 mg/dL   Creatinine, Ser 6.02 (H) 0.61 - 1.24 mg/dL   Calcium 8.8 (L) 8.9 - 10.3 mg/dL   Phosphorus 6.5 (H) 2.5 - 4.6 mg/dL   Albumin 1.7 (L) 3.5 - 5.0 g/dL   GFR calc non Af Amer 10 (L) >60 mL/min   GFR calc Af Amer 11 (L) >60 mL/min    Anion gap 23 (H) 5 - 15  CBC with Differential/Platelet     Status: Abnormal   Collection Time: 01/05/20  3:27 AM  Result Value Ref Range   WBC 28.4 (H) 4.0 - 10.5 K/uL   RBC 2.56 (L) 4.22 - 5.81 MIL/uL   Hemoglobin 7.3 (L) 13.0 - 17.0 g/dL   HCT 23.7 (L) 39.0 - 52.0 %   MCV 92.6 80.0 - 100.0 fL   MCH 28.5 26.0 - 34.0 pg   MCHC 30.8 30.0 - 36.0 g/dL   RDW 19.6 (H) 11.5 - 15.5 %   Platelets 344 150 - 400 K/uL   nRBC 2.1 (H) 0.0 - 0.2 %   Neutrophils Relative % 79 %   Neutro Abs 22.5 (H) 1.7 - 7.7  K/uL   Lymphocytes Relative 7 %   Lymphs Abs 2.0 0.7 - 4.0 K/uL   Monocytes Relative 5 %   Monocytes Absolute 1.5 (H) 0.1 - 1.0 K/uL   Eosinophils Relative 1 %   Eosinophils Absolute 0.3 0.0 - 0.5 K/uL   Basophils Relative 1 %   Basophils Absolute 0.2 (H) 0.0 - 0.1 K/uL   WBC Morphology MILD LEFT SHIFT (1-5% METAS, OCC MYELO, OCC BANDS)    Immature Granulocytes 7 %   Abs Immature Granulocytes 1.89 (H) 0.00 - 0.07 K/uL   Polychromasia PRESENT   Lactic acid, plasma     Status: Abnormal   Collection Time: 01/05/20  3:27 AM  Result Value Ref Range   Lactic Acid, Venous 3.3 (HH) 0.5 - 1.9 mmol/L  Triglycerides     Status: Abnormal   Collection Time: 01/05/20  3:27 AM  Result Value Ref Range   Triglycerides 173 (H) <150 mg/dL  Type and screen [place]     Status: None   Collection Time: 01/05/20  3:46 AM  Result Value Ref Range   ABO/RH(D) B POS    Antibody Screen NEG    Sample Expiration      01/08/2020,2359 Performed at [facility], [place] [address] [place], [place] [address]   Glucose, capillary     Status: Abnormal   Collection Time: 01/05/20  4:55 AM  Result Value Ref Range   Glucose-Capillary 304 (H) 70 - 99 mg/dL   Comment 1 Notify RN   Blood gas, venous     Status: Abnormal   Collection Time: 01/05/20  5:26 AM  Result Value Ref Range   FIO2 60.00    pH, Ven 7.238 (L) 7.250 - 7.430   pCO2, Ven 43.1 (L) 44.0 - 60.0 mmHg   pO2, Ven 43.2 32.0  - 45.0 mmHg   Bicarbonate 17.7 (L) 20.0 - 28.0 mmol/L   Acid-base deficit 8.4 (H) 0.0 - 2.0 mmol/L   O2 Saturation 64.7 %   Patient temperature 37.0    Collection site CENTRAL LINE    Drawn by COLLECTED BY RT    Sample type VENOUS   Glucose, capillary     Status: Abnormal   Collection Time: 01/05/20  8:11 AM  Result Value Ref Range   Glucose-Capillary 345 (H) 70 - 99 mg/dL  Glucose, capillary     Status: Abnormal   Collection Time: 01/05/20 10:15 AM  Result Value Ref Range   Glucose-Capillary 365 (H) 70 - 99 mg/dL  Glucose, capillary     Status: Abnormal   Collection Time: 01/05/20 12:36 PM  Result Value Ref Range   Glucose-Capillary 372 (H) 70 - 99 mg/dL    Imaging or Labs ordered: Right knee x-rays ordered  Medical history and chart was reviewed and case discussed with medical provider.  Assessment/Plan: 54 year old male with acute on chronic respiratory failure with shock likely from MRSA bacteremia with right knee pain.  The patient's clinical exam is rather benign.  I do not feel that his knee is septic.  There is minimal effusion on my exam and he grimaces and withdraws with attempted motion at both knees.  I have ordered a knee x-ray to evaluate for possible arthritis and other sources of knee pain.  I am unsure how or where they aspirated the right knee but they were only able to get 1 mL.  I would recommend following the cultures and we can reassess if they return positive.  However I  do not feel there is any role for surgical intervention.  Recommendations: 1.  Right knee plain films 2.  Follow-up cultures from aspiration 3.  No current role for surgical intervention 4.  Continue critical care per primary team with broad-spectrum antibiotics  Shona Needles, MD [facility] [phone] (office) [email or web address]

## 2020-01-05 NOTE — Procedures (Signed)
Intubation Procedure Note Avon Mergenthaler [id number] [birth year]  Procedure: Intubation Indications: Acute on chronic hypercapnic respiratory failure in setting of septic shock   Procedure Details Consent: Unable to obtain consent because of emergent medical necessity. Time Out: Verified patient identification, verified procedure, site/side was marked, verified correct patient position, special equipment/implants available, medications/allergies/relevent history reviewed, required imaging and test results available.  Performed  Maximum sterile technique was used including gloves, hand hygiene and mask.   Easy intubation with glidescope s4 blade with grade 1 view after suctioning out thick dry greenish secretions adherent to and obscuring vocal cords    Evaluation Hemodynamic Status: BP stable throughout on same vasopressor doses; O2 sats: stable throughout Patient's Current Condition: unstable Complications: No apparent complications Patient did tolerate procedure well. Chest X-ray ordered to verify placement.  CXR: tube position acceptable.   Maryjane Hurter 01/05/2020

## 2020-01-05 NOTE — Progress Notes (Signed)
Spoke with sisters at bedside. They are his decision makers (mother is ill in a SNF) and they would like for him to be DNR going forward.  Changed in the chart.

## 2020-01-05 NOTE — Progress Notes (Signed)
Received a call from 2W staff about Seth [name] receiving an order to transfer to ICU and PCCM at bedside. I assisted with transferring Seth [name] to the ICU in a bed with monitoring and oxygen. Pt transferred to 2M14.

## 2020-01-05 NOTE — Progress Notes (Addendum)
NURSING PROGRESS NOTE  Seth [name] [id number] Transfer Data: 01/04/2020 22:00 Code Status: Full   Seth [name] is a 54 y.o. male patient transferred from 2W, arrived on Bipap with respiratory rates ranging from 53s to mid 34s.   Cardiac Monitoring: Box # [id number] in place.  Vitals on arrival: BP 82/41, HR 89, RR 34, SPO2 100 on Bipap, temp 101.7. Right upper arm IV in place.   Past Medical History:   has a past medical history of Acute on chronic respiratory failure with hypoxia and hypercapnia ([place]) (05/29/2019), Diabetes mellitus, Dialysis patient [facility]), Discitis (07/2015), ESRD (end stage renal disease) on dialysis [facility]), Hypertension, RETINAL DETACHMENT, HX OF (06/20/2007), Sleep apnea, and Type 2 diabetes mellitus ([place]).  Past Surgical History:   has a past surgical history that includes AV fistula placement (05/03/2012); Tracheostomy tube placement (N/A, 08/20/2013); TEE without cardioversion (N/A, 07/10/2015); ORIF mandibular fracture (N/A, 11/10/2017); Revison of arteriovenous fistula (Right, 05/31/2018); tracheostomy removal; A/V Fistulagram (N/A, 10/18/2018); AV fistula placement (Left, 10/23/2018); Insertion of dialysis catheter (Left, 12/03/2018); TEE without cardioversion (N/A, 12/07/2018); A/V Fistulagram (Left, 03/28/2019); UPPER EXTREMITY VENOGRAPHY (Bilateral, 03/28/2019); Bascilic vein transposition (Left, 04/02/2019); Insertion of dialysis catheter (Left, 06/03/2019); TEE without cardioversion (N/A, 06/03/2019); and IR Removal Tun Cv Cath W/O FL (12/27/2019).  Skin: Skin assessed with Micheal, RN. On arrival patient had bruising and scarring on the right side of his abdomen, 3 stage 2 pressure injuries on his buttocks, a healed surgical incision on the neck, and busing to the chest.   Admission inpatient armband information verified to include name and date of birth and placed on patient arm. Will continue to evaluate and treat per MD orders.

## 2020-01-05 NOTE — Progress Notes (Signed)
[name], male   DOB: Apr 24, 1966, 54 y.o.   MRN: [id number] [facility] Progress Note   Assessment/ Plan:   1. Septic shock with recurrent MRSA bacteremia: Clinical decompensation noted overnight following which patient was intubated and transferred to the ICU.  He is now on pressors.  Antibiotic therapy expanded to intravenous vancomycin and cefepime.  He likely has SBE (presumptive) and septic arthritis of right knee-agree with plan for TEE while intubated after hemodynamically stabilized. 2. ESRD: Usually on a Monday/Wednesday/Friday schedule as an outpatient and he underwent hemodialysis on Friday without any volume removal.  His physical exam and labs do not indicate any acute need for dialysis at this time-potassium of 5.0 likely a result of his concomitant hyperglycemia and should correct with judgment of the latter.  If he remains pressor dependent tomorrow, will need CRRT (unless the goal of hemodialysis is entirely clearance with limited volume unloading). 3. Anemia: Hemoglobin and hematocrit remain low in the setting of recurrent infection/inflammatory complex.  Continue to monitor on ESA. 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 5. Nutrition: Low albumin likely secondary to infection/negative acute phase reaction; tube feeds per RD.  Subjective:   Significant change in mental status noted yesterday evening with hypoxic/hypercapnic respiratory failure for which she was consequently intubated and transferred to the ICU.  On pressors.   Objective:   BP (!) 111/51   Pulse 91   Temp (!) 102.1 F (38.9 C)   Resp (!) 33   Ht _0  (1.727 m)   Wt 119.2 kg   SpO2 96%   BMI 39.96 kg/m   Physical Exam: Gen: Intubated, sedated, appears comfortable CVS: Pulse regular rhythm, normal rate, S1 and S2 normal Resp: Anteriorly clear to auscultation, no rales/rhonchi Abd: Soft, obese,  nontender Ext: No lower extremity edema, left upper arm AV fistula with palpable thrill.  Mild right knee swelling noted-nontender.  Labs: BMET Recent Labs  Lab 12/31/19 0428 12/31/19 0428 12/25/2019 0245 01/02/20 0054 01/02/20 1607 12/16/2019 0333 01/04/20 0328 01/04/20 2327 01/05/20 0327  NA 126*   < > 130* 132* 134* 135 134* 136 133*  K 5.0   < > 5.3* 5.1 4.0 4.4 4.1 4.8 5.0  CL 87*   < > 91* 93* 95* 96* 93* 95* 92*  CO2 22   < > 22 17* _1 18*  GLUCOSE 144*   < > 170* 158* 178* 186* 207* 240* 320*  BUN 102*   < > 56* 27* 49* 61* 36* 55* 60*  CREATININE 8.57*   < > 5.47* 3.36* 5.02* 5.79* 4.24* 5.90* 6.02*  CALCIUM 8.8*   < > 8.8* 9.2 9.3 9.0 9.0 9.0 8.8*  PHOS 6.4*  --  5.7* 4.7*  --  6.0* 4.7* 5.5* 6.5*   < > = values in this interval not displayed.   CBC Recent Labs  Lab 01/02/20 0054 01/02/20 1607 01/06/2020 0333 01/04/20 0328 01/04/20 2327 01/05/20 0327  WBC 41.6*   < > 36.0* 29.7* 29.9* 28.4*  NEUTROABS 33.2*  --  28.8* 24.0*  --  22.5*  HGB 8.9*   < > 8.5* 7.6* 7.6* 7.3*  HCT 27.0*   < > 26.6* 24.7* 23.8* 23.7*  MCV 87.9   < > 88.7 91.5 90.8 92.6  PLT 373   < > 354 328 350 344   < > = values in this interval not displayed.  Medications:    . chlorhexidine gluconate (MEDLINE KIT)  15 mL Mouth Rinse BID  . Chlorhexidine Gluconate Cloth  6 each Topical Q0600  . ferric citrate  420 mg Oral TID WC  . heparin  5,000 Units Subcutaneous Q8H  . hydrocortisone sod succinate (SOLU-CORTEF) inj  50 mg Intravenous Q6H  . insulin aspart  0-6 Units Subcutaneous Q4H  . lidocaine  15 mL Oral Once  . mouth rinse  15 mL Mouth Rinse 10 times per day  . midodrine  15 mg Oral TID WC  . pantoprazole (PROTONIX) IV  40 mg Intravenous Q24H   Elmarie Shiley, MD 01/05/2020, 7:48 AM

## 2020-01-05 NOTE — Progress Notes (Addendum)
Subjective:  Intubated  Antibiotics:  Anti-infectives (From admission, onward)   Start     Dose/Rate Route Frequency Ordered Stop   01/04/20 2130  ceFEPIme (MAXIPIME) 2 g in sodium chloride 0.9 % 100 mL IVPB     2 g 200 mL/hr over 30 Minutes Intravenous Every 24 hours 01/04/20 2044     12/16/2019 1200  vancomycin (VANCOCIN) IVPB 1000 mg/200 mL premix     1,000 mg 200 mL/hr over 60 Minutes Intravenous Every M-W-F (Hemodialysis) 01/02/20 0945 01/22/20 1159   12/16/2019 2200  vancomycin (VANCOCIN) IVPB 1000 mg/200 mL premix     1,000 mg 200 mL/hr over 60 Minutes Intravenous Every M-W-F (Hemodialysis) 12/18/2019 2020 12/20/2019 2308   12/10/2019 1600  vancomycin (VANCOCIN) IVPB 1000 mg/200 mL premix  Status:  Discontinued     1,000 mg 200 mL/hr over 60 Minutes Intravenous Every M-W-F (Hemodialysis) 12/11/2019 1024 12/15/2019 2021   12/31/19 1600  vancomycin (VANCOCIN) IVPB 1000 mg/200 mL premix     1,000 mg 200 mL/hr over 60 Minutes Intravenous Every T-Th-Sa (Hemodialysis) 12/31/19 0958 12/31/19 1549   12/31/19 1309  vancomycin variable dose per unstable renal function (pharmacist dosing)  Status:  Discontinued      Does not apply See admin instructions 12/31/19 1310 01/02/20 0945   12/30/19 1728  vancomycin variable dose per unstable renal function (pharmacist dosing)  Status:  Discontinued      Does not apply See admin instructions 12/30/19 1728 12/31/19 0958   12/27/19 1200  vancomycin (VANCOCIN) IVPB 1000 mg/200 mL premix  Status:  Discontinued     1,000 mg 200 mL/hr over 60 Minutes Intravenous Every M-W-F (Hemodialysis) 12/27/19 0242 12/30/19 1728   12/27/19 1000  piperacillin-tazobactam (ZOSYN) IVPB 2.25 g  Status:  Discontinued     2.25 g 100 mL/hr over 30 Minutes Intravenous Every 8 hours 12/27/19 0242 12/27/19 1432   12/27/19 0030  piperacillin-tazobactam (ZOSYN) IVPB 3.375 g     3.375 g 100 mL/hr over 30 Minutes Intravenous  Once 12/27/19 0027 12/27/19 0209   01/02/2020 2245   vancomycin (VANCOCIN) 2,500 mg in sodium chloride 0.9 % 500 mL IVPB     2,500 mg 250 mL/hr over 120 Minutes Intravenous  Once 12/27/2019 2240 12/27/19 0124      Medications: Scheduled Meds: . chlorhexidine gluconate (MEDLINE KIT)  15 mL Mouth Rinse BID  . Chlorhexidine Gluconate Cloth  6 each Topical Q0600  . ferric citrate  420 mg Oral TID WC  . heparin  5,000 Units Subcutaneous Q8H  . hydrocortisone sod succinate (SOLU-CORTEF) inj  50 mg Intravenous Q6H  . insulin aspart  0-20 Units Subcutaneous Q4H  . lidocaine  15 mL Oral Once  . mouth rinse  15 mL Mouth Rinse 10 times per day  . midodrine  15 mg Oral TID WC  . pantoprazole (PROTONIX) IV  40 mg Intravenous Q24H   Continuous Infusions: . sodium chloride 250 mL (12/27/19 0436)  . sodium chloride 100 mL (12/25/2019 0049)  . sodium chloride    . ceFEPime (MAXIPIME) IV 2 g (01/05/20 0403)  . norepinephrine (LEVOPHED) Adult infusion 20 mcg/min (01/05/20 0100)  . propofol (DIPRIVAN) infusion 24.215 mcg/kg/min (01/05/20 0750)  . sodium chloride Stopped (01/05/20 0225)  . vancomycin Stopped (12/30/2019 1425)  . vasopressin (PITRESSIN) infusion - *FOR SHOCK* 0.03 Units/min (01/05/20 0100)   PRN Meds:.sodium chloride, sodium chloride, acetaminophen, acetaminophen, alteplase, fentaNYL (SUBLIMAZE) injection, fentaNYL (SUBLIMAZE) injection, heparin, lidocaine (PF), lidocaine-prilocaine, pentafluoroprop-tetrafluoroeth  Objective: Weight change: -4 kg  Intake/Output Summary (Last 24 hours) at 01/05/2020 1204 Last data filed at 01/05/2020 1100 Gross per 24 hour  Intake 584.94 ml  Output 50 ml  Net 534.94 ml   Blood pressure (!) 125/57, pulse 86, temperature (!) 101.6 F (38.7 C), temperature source Oral, resp. rate (!) 33, height _0  (1.727 m), weight 119.2 kg, SpO2 95 %. Temp:  [100.1 F (37.8 C)-102.5 F (39.2 C)] 101.6 F (38.7 C) (03/28 0800) Pulse Rate:  [86-109] 86 (03/28 1100) Resp:  [30-43] 33 (03/28 0416) BP:  (62-137)/(30-61) 125/57 (03/28 1100) SpO2:  [81 %-100 %] 95 % (03/28 1133) FiO2 (%):  [50 %-100 %] 50 % (03/28 1133) Weight:  [119.2 kg] 119.2 kg (03/28 0500)  Physical Exam: General: Intubated  HEENT: anicteric sclera, EOMI CVS tachycardic no murmurs gallops rubs heard Chest: , Tachypneic with coarse breath sounds Abdomen: soft non-distended,  Extremities: Right knee has an effusion and is tender to palpation  skin: Fistula Neuro: nonfocal  CBC:    BMET Recent Labs    01/04/20 2327 01/05/20 0327  NA 136 133*  K 4.8 5.0  CL 95* 92*  CO2 22 18*  GLUCOSE 240* 320*  BUN 55* 60*  CREATININE 5.90* 6.02*  CALCIUM 9.0 8.8*     Liver Panel  Recent Labs    01/02/20 1607 12/18/2019 0333 01/04/20 2327 01/05/20 0327  PROT 7.7  --  8.3*  --   ALBUMIN 1.7*   < > 1.8* 1.7*  AST 87*  --  95*  --   ALT 30  --  35  --   ALKPHOS 256*  --  279*  --   BILITOT 1.3*  --  2.0*  --    < > = values in this interval not displayed.       Sedimentation Rate Recent Labs    01/04/20 2327  ESRSEDRATE >140*   C-Reactive Protein Recent Labs    01/04/20 2327  CRP 43.1*    Micro Results: Recent Results (from the past 720 hour(s))  SARS CORONAVIRUS 2 (TAT 6-24 HRS) Nasopharyngeal Nasopharyngeal Swab     Status: None   Collection Time: 12/22/19 11:45 PM   Specimen: Nasopharyngeal Swab  Result Value Ref Range Status   SARS Coronavirus 2 NEGATIVE NEGATIVE Final    Comment: (NOTE) SARS-CoV-2 target nucleic acids are NOT DETECTED. The SARS-CoV-2 RNA is generally detectable in upper and lower respiratory specimens during the acute phase of infection. Negative results do not preclude SARS-CoV-2 infection, do not rule out co-infections with other pathogens, and should not be used as the sole basis for treatment or other patient management decisions. Negative results must be combined with clinical observations, patient history, and epidemiological information. The expected result  is Negative. Fact Sheet for Patients: SugarRoll.be Fact Sheet for Healthcare Providers: https://www.woods-mathews.com/ This test is not yet approved or cleared by the [place] FDA and  has been authorized for detection and/or diagnosis of SARS-CoV-2 by FDA under an Emergency Use Authorization (EUA). This EUA will remain  in effect (meaning this test can be used) for the duration of the COVID-19 declaration under Section 56 4(b)(1) of the Act, 21 U.S.C. section 360bbb-3(b)(1), unless the authorization is terminated or revoked sooner. Performed at [facility], [name] [address], [place], [place] [address]   Blood Culture (routine x 2)     Status: Abnormal   Collection Time: 12/31/2019 10:15 PM   Specimen: BLOOD RIGHT ARM  Result Value  Ref Range Status   Specimen Description BLOOD RIGHT ARM  Final   Special Requests   Final    BOTTLES DRAWN AEROBIC AND ANAEROBIC Blood Culture results may not be optimal due to an inadequate volume of blood received in culture bottles   Culture  Setup Time   Final    GRAM POSITIVE COCCI IN CLUSTERS IN BOTH AEROBIC AND ANAEROBIC BOTTLES CRITICAL RESULT CALLED TO, READ BACK BY AND VERIFIED WITH: J. Roselie Skinner, AT 1009 12/27/19 DV Performed at [facility], [place] [address], [place], [place] [address]    Culture METHICILLIN RESISTANT STAPHYLOCOCCUS AUREUS (A)  Final   Report Status 12/29/2019 FINAL  Final   Organism ID, Bacteria METHICILLIN RESISTANT STAPHYLOCOCCUS AUREUS  Final      Susceptibility   Methicillin resistant staphylococcus aureus - MIC*    CIPROFLOXACIN <=0.5 SENSITIVE Sensitive     ERYTHROMYCIN >=8 RESISTANT Resistant     GENTAMICIN <=0.5 SENSITIVE Sensitive     OXACILLIN >=4 RESISTANT Resistant     TETRACYCLINE <=1 SENSITIVE Sensitive     VANCOMYCIN 1 SENSITIVE Sensitive     TRIMETH/SULFA <=10 SENSITIVE Sensitive     CLINDAMYCIN <=0.25 SENSITIVE Sensitive     RIFAMPIN <=0.5 SENSITIVE  Sensitive     Inducible Clindamycin NEGATIVE Sensitive     * METHICILLIN RESISTANT STAPHYLOCOCCUS AUREUS  Blood Culture (routine x 2)     Status: Abnormal   Collection Time: 12/28/2019 10:41 PM   Specimen: BLOOD RIGHT HAND  Result Value Ref Range Status   Specimen Description BLOOD RIGHT HAND  Final   Special Requests AEROBIC BOTTLE ONLY Blood Culture adequate volume  Final   Culture  Setup Time   Final    GRAM POSITIVE COCCI IN CLUSTERS AEROBIC BOTTLE ONLY CRITICAL VALUE NOTED.  VALUE IS CONSISTENT WITH PREVIOUSLY REPORTED AND CALLED VALUE.    Culture (A)  Final    STAPHYLOCOCCUS AUREUS SUSCEPTIBILITIES PERFORMED ON PREVIOUS CULTURE WITHIN THE LAST 5 DAYS. Performed at [facility], [place] [address], [place], [place] [address]    Report Status 12/29/2019 FINAL  Final  Blood Culture ID Panel (Reflexed)     Status: Abnormal   Collection Time: 12/27/2019 10:41 PM  Result Value Ref Range Status   Enterococcus species NOT DETECTED NOT DETECTED Final   Listeria monocytogenes NOT DETECTED NOT DETECTED Final   Staphylococcus species DETECTED (A) NOT DETECTED Final    Comment: CRITICAL RESULT CALLED TO, READ BACK BY AND VERIFIED WITH: Andres Shad PharmD 14:30 12/27/19 ([email or web address])    Staphylococcus aureus (BCID) DETECTED (A) NOT DETECTED Final    Comment: Methicillin (oxacillin)-resistant Staphylococcus aureus (MRSA). MRSA is predictably resistant to beta-lactam antibiotics (except ceftaroline). Preferred therapy is vancomycin unless clinically contraindicated. Patient requires contact precautions if  hospitalized. CRITICAL RESULT CALLED TO, READ BACK BY AND VERIFIED WITH: Andres Shad PharmD 14:30 12/27/19 ([email or web address])    Methicillin resistance DETECTED (A) NOT DETECTED Final    Comment: CRITICAL RESULT CALLED TO, READ BACK BY AND VERIFIED WITH: Andres Shad PharmD 14:30 12/27/19 ([email or web address])    Streptococcus species NOT DETECTED NOT DETECTED Final   Streptococcus agalactiae NOT DETECTED NOT DETECTED Final    Streptococcus pneumoniae NOT DETECTED NOT DETECTED Final   Streptococcus pyogenes NOT DETECTED NOT DETECTED Final   Acinetobacter baumannii NOT DETECTED NOT DETECTED Final   Enterobacteriaceae species NOT DETECTED NOT DETECTED Final   Enterobacter cloacae complex NOT DETECTED NOT DETECTED Final   Escherichia coli NOT DETECTED NOT DETECTED Final   Klebsiella  oxytoca NOT DETECTED NOT DETECTED Final   Klebsiella pneumoniae NOT DETECTED NOT DETECTED Final   Proteus species NOT DETECTED NOT DETECTED Final   Serratia marcescens NOT DETECTED NOT DETECTED Final   Haemophilus influenzae NOT DETECTED NOT DETECTED Final   Neisseria meningitidis NOT DETECTED NOT DETECTED Final   Pseudomonas aeruginosa NOT DETECTED NOT DETECTED Final   Candida albicans NOT DETECTED NOT DETECTED Final   Candida glabrata NOT DETECTED NOT DETECTED Final   Candida krusei NOT DETECTED NOT DETECTED Final   Candida parapsilosis NOT DETECTED NOT DETECTED Final   Candida tropicalis NOT DETECTED NOT DETECTED Final    Comment: Performed at [facility], [place] [address], [place], [place] [address]  Respiratory Panel by RT PCR (Flu A&B, Covid) - Nasopharyngeal Swab     Status: None   Collection Time: 12/30/2019 11:22 PM   Specimen: Nasopharyngeal Swab  Result Value Ref Range Status   SARS Coronavirus 2 by RT PCR NEGATIVE NEGATIVE Final    Comment: (NOTE) SARS-CoV-2 target nucleic acids are NOT DETECTED. The SARS-CoV-2 RNA is generally detectable in upper respiratoy specimens during the acute phase of infection. The lowest concentration of SARS-CoV-2 viral copies this assay can detect is 131 copies/mL. A negative result does not preclude SARS-Cov-2 infection and should not be used as the sole basis for treatment or other patient management decisions. A negative result may occur with  improper specimen collection/handling, submission of specimen other than nasopharyngeal swab, presence of viral mutation(s) within the  areas targeted by this assay, and inadequate number of viral copies (<131 copies/mL). A negative result must be combined with clinical observations, patient history, and epidemiological information. The expected result is Negative. Fact Sheet for Patients:  PinkCheek.be Fact Sheet for Healthcare Providers:  GravelBags.it This test is not yet ap proved or cleared by the [place] FDA and  has been authorized for detection and/or diagnosis of SARS-CoV-2 by FDA under an Emergency Use Authorization (EUA). This EUA will remain  in effect (meaning this test can be used) for the duration of the COVID-19 declaration under Section 564(b)(1) of the Act, 21 U.S.C. section 360bbb-3(b)(1), unless the authorization is terminated or revoked sooner.    Influenza A by PCR NEGATIVE NEGATIVE Final   Influenza B by PCR NEGATIVE NEGATIVE Final    Comment: (NOTE) The Xpert Xpress SARS-CoV-2/FLU/RSV assay is intended as an aid in  the diagnosis of influenza from Nasopharyngeal swab specimens and  should not be used as a sole basis for treatment. Nasal washings and  aspirates are unacceptable for Xpert Xpress SARS-CoV-2/FLU/RSV  testing. Fact Sheet for Patients: PinkCheek.be Fact Sheet for Healthcare Providers: GravelBags.it This test is not yet approved or cleared by the [place] FDA and  has been authorized for detection and/or diagnosis of SARS-CoV-2 by  FDA under an Emergency Use Authorization (EUA). This EUA will remain  in effect (meaning this test can be used) for the duration of the  Covid-19 declaration under Section 564(b)(1) of the Act, 21  U.S.C. section 360bbb-3(b)(1), unless the authorization is  terminated or revoked. Performed at [facility], [place] [address], [place], [place] [address]   MRSA PCR Screening     Status: Abnormal   Collection Time: 12/27/19   5:30 AM   Specimen: Nasopharyngeal  Result Value Ref Range Status   MRSA by PCR POSITIVE (A) NEGATIVE Final    Comment:        The GeneXpert MRSA Assay (FDA approved for NASAL specimens  only), is one component of a comprehensive MRSA colonization surveillance program. It is not intended to diagnose MRSA infection nor to guide or monitor treatment for MRSA infections. RESULT CALLED TO, READ BACK BY AND VERIFIED WITH: RN Sarajane Jews [id number] FCP Performed at [place] [address], [place], [place] [address]   Culture, blood (routine x 2)     Status: Abnormal   Collection Time: 12/28/19  5:40 AM   Specimen: BLOOD RIGHT HAND  Result Value Ref Range Status   Specimen Description BLOOD RIGHT HAND  Final   Special Requests   Final    BOTTLES DRAWN AEROBIC AND ANAEROBIC Blood Culture adequate volume   Culture  Setup Time   Final    IN BOTH AEROBIC AND ANAEROBIC BOTTLES GRAM POSITIVE COCCI IN CLUSTERS CRITICAL VALUE NOTED.  VALUE IS CONSISTENT WITH PREVIOUSLY REPORTED AND CALLED VALUE.    Culture (A)  Final    STAPHYLOCOCCUS AUREUS SUSCEPTIBILITIES PERFORMED ON PREVIOUS CULTURE WITHIN THE LAST 5 DAYS. Performed at [facility], [place] [address] [place], [place] [address]    Report Status 01/02/2020 FINAL  Final  Culture, blood (routine x 2)     Status: Abnormal   Collection Time: 12/28/19  5:59 AM   Specimen: BLOOD RIGHT HAND  Result Value Ref Range Status   Specimen Description BLOOD RIGHT HAND  Final   Special Requests IN PEDIATRIC BOTTLE Blood Culture adequate volume  Final   Culture  Setup Time   Final    GRAM POSITIVE COCCI IN PEDIATRIC BOTTLE CRITICAL RESULT CALLED TO, READ BACK BY AND VERIFIED WITH: PHARMD LINDSAY CHEN _0  12/28/2019 AKT    Culture (A)  Final    STAPHYLOCOCCUS AUREUS SUSCEPTIBILITIES PERFORMED ON PREVIOUS CULTURE WITHIN THE LAST 5 DAYS. Performed at [facility], [place] [address], [place], [place] [address]    Report Status  12/29/2019 FINAL  Final  Culture, blood (routine x 2)     Status: Abnormal   Collection Time: 12/30/19  6:35 AM   Specimen: BLOOD  Result Value Ref Range Status   Specimen Description BLOOD RIGHT ANTECUBITAL  Final   Special Requests   Final    BOTTLES DRAWN AEROBIC AND ANAEROBIC Blood Culture adequate volume   Culture  Setup Time   Final    GRAM POSITIVE COCCI AEROBIC BOTTLE ONLY CRITICAL VALUE NOTED.  VALUE IS CONSISTENT WITH PREVIOUSLY REPORTED AND CALLED VALUE.    Culture (A)  Final    STAPHYLOCOCCUS AUREUS SUSCEPTIBILITIES PERFORMED ON PREVIOUS CULTURE WITHIN THE LAST 5 DAYS. Performed at [facility], [place] [address] [place], [place] [address]    Report Status 01/04/2020 FINAL  Final  Culture, blood (routine x 2)     Status: Abnormal   Collection Time: 12/30/19  6:40 AM   Specimen: BLOOD RIGHT HAND  Result Value Ref Range Status   Specimen Description BLOOD RIGHT HAND  Final   Special Requests   Final    BOTTLES DRAWN AEROBIC AND ANAEROBIC Blood Culture adequate volume   Culture  Setup Time   Final    GRAM POSITIVE COCCI IN CLUSTERS IN BOTH AEROBIC AND ANAEROBIC BOTTLES CRITICAL VALUE NOTED.  VALUE IS CONSISTENT WITH PREVIOUSLY REPORTED AND CALLED VALUE.    Culture METHICILLIN RESISTANT STAPHYLOCOCCUS AUREUS (A)  Final   Report Status 01/02/2020 FINAL  Final   Organism ID, Bacteria METHICILLIN RESISTANT STAPHYLOCOCCUS AUREUS  Final      Susceptibility   Methicillin resistant staphylococcus  aureus - MIC*    CIPROFLOXACIN <=0.5 SENSITIVE Sensitive     ERYTHROMYCIN >=8 RESISTANT Resistant     GENTAMICIN <=0.5 SENSITIVE Sensitive     OXACILLIN >=4 RESISTANT Resistant     TETRACYCLINE <=1 SENSITIVE Sensitive     VANCOMYCIN 1 SENSITIVE Sensitive     TRIMETH/SULFA <=10 SENSITIVE Sensitive     CLINDAMYCIN <=0.25 SENSITIVE Sensitive     RIFAMPIN <=0.5 SENSITIVE Sensitive     Inducible Clindamycin NEGATIVE Sensitive     * METHICILLIN RESISTANT STAPHYLOCOCCUS AUREUS   Culture, blood (routine x 2)     Status: None   Collection Time: 12/31/19 11:09 AM   Specimen: BLOOD RIGHT HAND  Result Value Ref Range Status   Specimen Description BLOOD RIGHT HAND  Final   Special Requests AEROBIC BOTTLE ONLY Blood Culture adequate volume  Final   Culture   Final    NO GROWTH 5 DAYS Performed at [facility], [place] [address], [place], [place] [address]    Report Status 01/05/2020 FINAL  Final  Culture, blood (routine x 2)     Status: Abnormal   Collection Time: 12/31/19 11:23 AM   Specimen: BLOOD RIGHT HAND  Result Value Ref Range Status   Specimen Description BLOOD RIGHT HAND  Final   Special Requests   Final    AEROBIC BOTTLE ONLY Blood Culture results may not be optimal due to an inadequate volume of blood received in culture bottles   Culture  Setup Time   Final    AEROBIC BOTTLE ONLY GRAM POSITIVE COCCI CRITICAL VALUE NOTED.  VALUE IS CONSISTENT WITH PREVIOUSLY REPORTED AND CALLED VALUE.    Culture (A)  Final    STAPHYLOCOCCUS AUREUS SUSCEPTIBILITIES PERFORMED ON PREVIOUS CULTURE WITHIN THE LAST 5 DAYS. Performed at [facility], [place] [address] [place], [place] [address]    Report Status 12/10/2019 FINAL  Final  Culture, blood (Routine X 2) w Reflex to ID Panel     Status: None (Preliminary result)   Collection Time: 01/02/20 12:40 AM   Specimen: BLOOD RIGHT HAND  Result Value Ref Range Status   Specimen Description BLOOD RIGHT HAND  Final   Special Requests AEROBIC BOTTLE ONLY Blood Culture adequate volume  Final   Culture   Final    NO GROWTH 3 DAYS Performed at [facility], [place] [address], [place],  [address]    Report Status PENDING  Incomplete  Culture, blood (Routine X 2) w Reflex to ID Panel     Status: None (Preliminary result)   Collection Time: 01/02/20 12:50 AM   Specimen: BLOOD RIGHT HAND  Result Value Ref Range Status   Specimen Description BLOOD RIGHT HAND  Final   Special Requests   Final    BOTTLES DRAWN  AEROBIC AND ANAEROBIC Blood Culture adequate volume   Culture   Final    NO GROWTH 3 DAYS Performed at [facility], [place] [address], [place],  [address]    Report Status PENDING  Incomplete  Culture, blood (routine x 2)     Status: None (Preliminary result)   Collection Time: 01/05/20  2:32 AM   Specimen: BLOOD  Result Value Ref Range Status   Specimen Description BLOOD RIGHT HAND  Final   Special Requests   Final    BOTTLES DRAWN AEROBIC AND ANAEROBIC Blood Culture adequate volume   Culture   Final    NO GROWTH <12 HOURS Performed at [facility],  [address] [place], [place] [address]    Report Status PENDING  Incomplete  Culture, blood (routine x 2)     Status: None (Preliminary result)   Collection Time: 01/05/20  2:33 AM   Specimen: BLOOD  Result Value Ref Range Status   Specimen Description BLOOD RIGHT HAND  Final   Special Requests   Final    BOTTLES DRAWN AEROBIC AND ANAEROBIC Blood Culture adequate volume   Culture   Final    NO GROWTH <12 HOURS Performed at [facility], [place] [address], [place], [place] [address]    Report Status PENDING  Incomplete    Studies/Results: CT HEAD WO CONTRAST  Result Date: 01/05/2020 CLINICAL DATA:  Encephalopathy. EXAM: CT HEAD WITHOUT CONTRAST TECHNIQUE: Contiguous axial images were obtained from the base of the skull through the vertex without intravenous contrast. COMPARISON:  Head CT 05/28/2019 FINDINGS: Brain: No intracranial hemorrhage, mass effect, or midline shift. No hydrocephalus. The basilar cisterns are patent. No evidence of territorial infarct or acute ischemia. No extra-axial or intracranial fluid collection. Vascular: Atherosclerosis of skullbase vasculature without hyperdense vessel or abnormal calcification. Skull: No fracture or focal lesion. Sinuses/Orbits: Opacification of posterior ethmoid air cells may be secondary to intubation. Endotracheal and enteric tubes in place. Mastoid air cells  are clear. Other: None. IMPRESSION: No acute intracranial abnormality. Electronically Signed   By: Keith Rake M.D.   On: 01/05/2020 02:15   DG CHEST PORT 1 VIEW  Result Date: 01/05/2020 CLINICAL DATA:  Endotracheal tube placement. EXAM: PORTABLE CHEST 1 VIEW COMPARISON:  Radiograph yesterday. CT 12/22/2019 FINDINGS: Endotracheal tube tip 2.8 cm from the carina. Enteric tube in place with tip and side-port below the diaphragm. Progressive volume loss in the right hemithorax with increasing opacity at the right lung base. There is rightward mediastinal shift, possibly accentuated by rotation. Vascular congestion with mild pulmonary edema. Patchy opacity at the left lung base. No pneumothorax. Cervicothoracic fusion hardware is partially included. IMPRESSION: 1. Endotracheal tube tip 2.8 cm from the carina. 2. Progressive volume loss in the right hemithorax with increasing opacity at the right lung base. Findings are suspicious for atelectasis/lobar collapse. 3. Vascular congestion with mild pulmonary edema. Patchy left lung base opacities likely atelectasis. Electronically Signed   By: Keith Rake M.D.   On: 01/05/2020 01:41   DG Chest Port 1 View  Result Date: 01/04/2020 CLINICAL DATA:  Hypoxia EXAM: PORTABLE CHEST 1 VIEW COMPARISON:  12/09/2019 FINDINGS: Single frontal view of the chest demonstrates stable postsurgical changes from spinal fusion. Cardiac silhouette is stable. Lung volumes are diminished, with prominent central vascular congestion. There is right middle lobe consolidation with obscuration of the right heart border. No large effusion or pneumothorax. IMPRESSION: 1. Low lung volumes. 2. Increased central vascular congestion. Accentuation of vessels could be due to low lung volumes, versus developing fluid overload. 3. New right middle lobe consolidation, favor atelectasis. Electronically Signed   By: Randa Ngo M.D.   On: 01/04/2020 18:51   DG Abd Portable 1V  Result Date:  01/05/2020 CLINICAL DATA:  OG tube placement. EXAM: PORTABLE ABDOMEN - 1 VIEW COMPARISON:  No recent abdominal imaging. Chest CT 12/20/2019 FINDINGS: Tip and side port of the enteric tube below the diaphragm, likely in the stomach. Air in the upper abdomen is likely related to gaseous gastric distension, however more detailed assessment is limited given soft tissue attenuation from habitus. IMPRESSION: Tip and side port of the enteric tube below the diaphragm, likely in the  stomach. Gaseous gastric distension suspected. Electronically Signed   By: Keith Rake M.D.   On: 01/05/2020 01:24      Assessment/Plan:  INTERVAL HISTORY:   Since yesterday when I saw him he has been transferred to the intensive care unit and intubated and is now on multiple pressors  Repeat blood cultures were taken.  His right knee has been aspirated though I cannot tell in epic that there is any cell count or differential being done  Cefepime has been added  Principal Problem:   MRSA bacteremia Active Problems:   Hyperlipidemia associated with type 2 diabetes mellitus ([place])   ESRD (end stage renal disease) on dialysis ([place])   Absolute anemia   DM (diabetes mellitus) type 2, uncontrolled, with ketoacidosis ([name])   Hyperkalemia   Hyponatremia   Septic shock ([place])   Sepsis due to infected intravenous catheter ([place])   Wrist pain, acute, left    Seth [name] is a 54 y.o. 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  Most recent blood cultures from the 25th is not growing any organism.  His right knee is quite tender to palpation has an effusion I suspect is a site of septic joint    #1 Sepsis: likely due to uncontrolled MRSA bacteremia in setting where he needs source control including likely I and D of right knee  OK with cefepime and vancomycin for now but would narrow back to vancomycin  when able    #2 Recurrent MRSA bacteremia:       [place] Antimicrobial Management Team Staphylococcus aureus bacteremia   Staphylococcus aureus bacteremia (SAB) is associated with a high rate of complications and mortality.  Specific aspects of clinical management are critical to optimizing the outcome of patients with SAB.  Therefore, the [facility] Antimicrobial Management Team [facility]) has initiated an intervention aimed at improving the management of SAB at [facility].  To do so, Infectious Diseases physicians are providing an evidence-based consult for the management of all patients with SAB.     Yes No Comments  Perform follow-up blood cultures (even if the patient is afebrile) to ensure clearance of bacteremia _0  _1   was recent blood cultures are not growing any organism  Remove vascular catheter and obtain follow-up blood cultures after the removal of the catheter _2  _3    Perform echocardiography to evaluate for endocarditis (transthoracic ECHO is 40-50% sensitive, TEE is > 90% sensitive) _4  _5   he was refusing TEE, now intubated  Consult electrophysiologist to evaluate implanted cardiac device (pacemaker, ICD) _6  _7  NA  Ensure source control _8  _9  Have all abscesses been drained effectively? Have deep seeded infections (septic joints or osteomyelitis) had appropriate surgical debridement? No HD lines  Investigate for "metastatic" sites of infection _10  _11  Does the patient have ANY symptom or physical exam finding that would suggest a deeper infection (back or neck pain that may be suggestive of vertebral osteomyelitis or epidural abscess, muscle pain that could be a symptom of pyomyositis)?  Keep in mind that for deep seeded infections MRI imaging with contrast is preferred rather than other often insensitive tests such as plain x-rays, especially early in a patient's presentation.  I am concerned for his right knee  Not clear if there is a cell count being done   Change  antibiotic therapy to vancomycin _12  _13  Beta-lactam antibiotics are preferred for MSSA due to higher cure rates.  If on Vancomycin, goal trough should be 15 - 20 mcg/mL  Estimated duration of IV antibiotic therapy:  6 weeks _0  _1  Consult case management for probably prolonged outpatient IV antibiotic therapy   #3 Septic knee: Suspect the right knee is infected. It was aspirated last night by critical care team.  I have called micro lab and it turns out that there was not sufficient material to do a cell count differential and culture.  Unfortunately the culture was pursued instead of the cell count differential when the latter would have been more informative.    Dr. Megan Salon is back tomorrow.   d LOS: 9 days   Alcide Evener 01/05/2020, 12:04 PM

## 2020-01-05 NOTE — Progress Notes (Signed)
eLink Physician-Brief Progress Note Patient Name: Seth [name] DOB: [birth year] MRN: [id number]   Date of Service  01/05/2020  HPI/Events of Note  Lactate 3.3  eICU Interventions  NS 250 ml iv over 2 hours        Frederik Pear 01/05/2020, 5:36 AM

## 2020-01-06 ENCOUNTER — Encounter (HOSPITAL_COMMUNITY): Payer: Self-pay | Admitting: Internal Medicine

## 2020-01-06 ENCOUNTER — Inpatient Hospital Stay (HOSPITAL_COMMUNITY): Payer: Medicare Other

## 2020-01-06 DIAGNOSIS — D62 Acute posthemorrhagic anemia: Secondary | ICD-10-CM | POA: Diagnosis not present

## 2020-01-06 DIAGNOSIS — J9601 Acute respiratory failure with hypoxia: Secondary | ICD-10-CM

## 2020-01-06 DIAGNOSIS — G9341 Metabolic encephalopathy: Secondary | ICD-10-CM | POA: Diagnosis not present

## 2020-01-06 DIAGNOSIS — R7881 Bacteremia: Secondary | ICD-10-CM | POA: Diagnosis not present

## 2020-01-06 DIAGNOSIS — A419 Sepsis, unspecified organism: Secondary | ICD-10-CM | POA: Diagnosis not present

## 2020-01-06 LAB — RENAL FUNCTION PANEL
Albumin: 1.6 g/dL — ABNORMAL LOW (ref 3.5–5.0)
Anion gap: 19 — ABNORMAL HIGH (ref 5–15)
BUN: 87 mg/dL — ABNORMAL HIGH (ref 6–20)
CO2: 20 mmol/L — ABNORMAL LOW (ref 22–32)
Calcium: 8.4 mg/dL — ABNORMAL LOW (ref 8.9–10.3)
Chloride: 98 mmol/L (ref 98–111)
Creatinine, Ser: 7.71 mg/dL — ABNORMAL HIGH (ref 0.61–1.24)
GFR calc Af Amer: 8 mL/min — ABNORMAL LOW (ref >60)
GFR calc non Af Amer: 7 mL/min — ABNORMAL LOW (ref >60)
Glucose, Bld: 307 mg/dL — ABNORMAL HIGH (ref 70–99)
Phosphorus: 7 mg/dL — ABNORMAL HIGH (ref 2.5–4.6)
Potassium: 4.9 mmol/L (ref 3.5–5.1)
Sodium: 137 mmol/L (ref 135–145)

## 2020-01-06 LAB — CBC WITH DIFFERENTIAL/PLATELET
Abs Immature Granulocytes: 1.5 10*3/uL — ABNORMAL HIGH (ref 0.00–0.07)
Basophils Absolute: 0.1 10*3/uL (ref 0.0–0.1)
Basophils Relative: 0 %
Eosinophils Absolute: 0 10*3/uL (ref 0.0–0.5)
Eosinophils Relative: 0 %
HCT: 21.1 % — ABNORMAL LOW (ref 39.0–52.0)
Hemoglobin: 6.5 g/dL — CL (ref 13.0–17.0)
Immature Granulocytes: 7 %
Lymphocytes Relative: 6 %
Lymphs Abs: 1.4 10*3/uL (ref 0.7–4.0)
MCH: 27.5 pg (ref 26.0–34.0)
MCHC: 30.8 g/dL (ref 30.0–36.0)
MCV: 89.4 fL (ref 80.0–100.0)
Monocytes Absolute: 1.1 10*3/uL — ABNORMAL HIGH (ref 0.1–1.0)
Monocytes Relative: 5 %
Neutro Abs: 17.6 10*3/uL — ABNORMAL HIGH (ref 1.7–7.7)
Neutrophils Relative %: 82 %
Platelets: 314 10*3/uL (ref 150–400)
RBC: 2.36 MIL/uL — ABNORMAL LOW (ref 4.22–5.81)
RDW: 19.9 % — ABNORMAL HIGH (ref 11.5–15.5)
WBC: 21.8 10*3/uL — ABNORMAL HIGH (ref 4.0–10.5)
nRBC: 0.9 % — ABNORMAL HIGH (ref 0.0–0.2)

## 2020-01-06 LAB — CBC
HCT: 24.1 % — ABNORMAL LOW (ref 39.0–52.0)
Hemoglobin: 7.8 g/dL — ABNORMAL LOW (ref 13.0–17.0)
MCH: 29.1 pg (ref 26.0–34.0)
MCHC: 32.4 g/dL (ref 30.0–36.0)
MCV: 89.9 fL (ref 80.0–100.0)
Platelets: 288 10*3/uL (ref 150–400)
RBC: 2.68 MIL/uL — ABNORMAL LOW (ref 4.22–5.81)
RDW: 19.3 % — ABNORMAL HIGH (ref 11.5–15.5)
WBC: 20.5 10*3/uL — ABNORMAL HIGH (ref 4.0–10.5)
nRBC: 0.8 % — ABNORMAL HIGH (ref 0.0–0.2)

## 2020-01-06 LAB — GLUCOSE, CAPILLARY
Glucose-Capillary: 289 mg/dL — ABNORMAL HIGH (ref 70–99)
Glucose-Capillary: 287 mg/dL — ABNORMAL HIGH (ref 70–99)
Glucose-Capillary: 324 mg/dL — ABNORMAL HIGH (ref 70–99)
Glucose-Capillary: 320 mg/dL — ABNORMAL HIGH (ref 70–99)
Glucose-Capillary: 284 mg/dL — ABNORMAL HIGH (ref 70–99)

## 2020-01-06 LAB — PREPARE RBC (CROSSMATCH)

## 2020-01-06 LAB — LACTIC ACID, PLASMA: Lactic Acid, Venous: 1.5 mmol/L (ref 0.5–1.9)

## 2020-01-06 LAB — TRIGLYCERIDES: Triglycerides: 299 mg/dL — ABNORMAL HIGH (ref <150)

## 2020-01-06 MED ORDER — ACETAMINOPHEN 325 MG PO TABS: 650.0000 mg | ORAL_TABLET | Freq: Four times a day (QID) | ORAL | Status: DC | PRN

## 2020-01-06 MED ORDER — INSULIN GLARGINE 100 UNIT/ML ~~LOC~~ SOLN
8.0000 [IU] | Freq: Two times a day (BID) | SUBCUTANEOUS | Status: DC
  Filled 2020-01-06 (×3): qty 0.08

## 2020-01-06 MED ORDER — DIPHENHYDRAMINE HCL 50 MG/ML IJ SOLN: 25.0000 mg | INTRAMUSCULAR | Status: DC | PRN

## 2020-01-06 MED ORDER — DEXTROSE 5 % IV SOLN: INTRAVENOUS | Status: DC

## 2020-01-06 MED ORDER — GLYCOPYRROLATE 1 MG PO TABS: 1.0000 mg | ORAL_TABLET | ORAL | Status: DC | PRN

## 2020-01-06 MED ORDER — DEXMEDETOMIDINE HCL IN NACL 400 MCG/100ML IV SOLN
0.0000 ug/kg/h | INTRAVENOUS | Status: DC
  Administered 2020-01-06: 11:00:00 0.8 ug/kg/h via INTRAVENOUS
  Administered 2020-01-06 (×2): 1.2 ug/kg/h via INTRAVENOUS
  Filled 2020-01-06 (×3): qty 100

## 2020-01-06 MED ORDER — GLYCOPYRROLATE 0.2 MG/ML IJ SOLN: 0.2000 mg | INTRAMUSCULAR | Status: DC | PRN

## 2020-01-06 MED ORDER — ACETAMINOPHEN 650 MG RE SUPP: 650.0000 mg | Freq: Four times a day (QID) | RECTAL | Status: DC | PRN

## 2020-01-06 MED ORDER — SODIUM CHLORIDE 0.9 % IV SOLN
8.0000 mg/h | INTRAVENOUS | Status: DC
  Administered 2020-01-06 (×2): 8 mg/h via INTRAVENOUS
  Filled 2020-01-06 (×3): qty 80

## 2020-01-06 MED ORDER — VANCOMYCIN VARIABLE DOSE PER UNSTABLE RENAL FUNCTION (PHARMACIST DOSING): Status: DC

## 2020-01-06 MED ORDER — SODIUM CHLORIDE 0.9 % IV SOLN
1.0000 g | INTRAVENOUS | Status: DC
  Filled 2020-01-06 (×2): qty 1

## 2020-01-06 MED ORDER — MORPHINE SULFATE (PF) 4 MG/ML IV SOLN: 4.0000 mg | Freq: Once | INTRAVENOUS | Status: AC

## 2020-01-06 MED ORDER — SODIUM CHLORIDE 0.9% IV SOLUTION: Freq: Once | INTRAVENOUS | Status: AC

## 2020-01-06 MED ORDER — MORPHINE BOLUS VIA INFUSION
5.0000 mg | INTRAVENOUS | Status: DC | PRN
  Administered 2020-01-06: 5 mg via INTRAVENOUS
  Filled 2020-01-06: qty 5

## 2020-01-06 MED ORDER — MORPHINE SULFATE (PF) 4 MG/ML IV SOLN
INTRAVENOUS | Status: AC
  Administered 2020-01-06: 19:00:00 4 mg via INTRAVENOUS
  Filled 2020-01-06: qty 1

## 2020-01-06 MED ORDER — POLYVINYL ALCOHOL 1.4 % OP SOLN
1.0000 [drp] | Freq: Four times a day (QID) | OPHTHALMIC | Status: DC | PRN
  Filled 2020-01-06: qty 15

## 2020-01-06 MED ORDER — MORPHINE 100MG IN NS 100ML (1MG/ML) PREMIX INFUSION
0.0000 mg/h | INTRAVENOUS | Status: DC
  Administered 2020-01-06: 5 mg/h via INTRAVENOUS
  Filled 2020-01-06 (×2): qty 100

## 2020-01-06 MED ORDER — MORPHINE SULFATE (PF) 2 MG/ML IV SOLN: 2.0000 mg | INTRAVENOUS | Status: DC | PRN

## 2020-01-06 NOTE — Progress Notes (Signed)
CRITICAL VALUE ALERT  Critical Value: Hbg 6.5  Date & Time Notied:  01/05/20 5:00  Provider Notified: E-link RN  Orders Received/Actions taken: RN will notify MD. Waiting for orders.

## 2020-01-06 NOTE — Progress Notes (Signed)
OT Cancellation Note  Patient Details Name: Seth [name] MRN: [id number] DOB: 1966/05/05   Cancelled Treatment:    Reason Eval/Treat Not Completed: Medical issues which prohibited therapy. Pt transferred to ICU on 3/27, now intubated and sedated. Will follow and await readiness to resume OT.  Seth [name] 01/06/2020, 9:15 AM  Nestor Lewandowsky, OTR/L Acute Rehabilitation Services Pager: [phone] Office: [phone]

## 2020-01-06 NOTE — Progress Notes (Signed)
   01/06/20 1210  Clinical Encounter Type  Visited With Patient and family together  Visit Type Critical Care  Referral From Palliative care team  Consult/Referral To Chaplain  Spiritual Encounters  Spiritual Needs Prayer  Stress Factors  Patient Stress Factors None identified  Family Stress Factors None identified   Chaplain responded to consult for family support at the time of transition to comfort care. Chaplain offered ministry of presence and prayer. Chaplains remain available for support as needs arise.   Chaplain Resident, Evelene Croon, M Div [phone] on-call pager

## 2020-01-06 NOTE — Consult Note (Signed)
NAME:  Seth [name], MRN:  [id number], DOB:  27-Jan-1966, LOS: 67 ADMISSION DATE:  12/27/2019, CONSULTATION DATE:  3/19 REFERRING MD:  Dr. Ralene Bathe EDP, CHIEF COMPLAINT:  AMS   Brief History   54 y/o M admitted 3/18 with back pain and AMS with presumed septic shock in setting of MRSA bacteremia (unclear source), presumed possibly bacteremic from tunneled HD catheter with concern for infective endocarditis, possible septic right knee and HAP.   Pt was treated with pressors and antibiotics, he improved and was transferred out of ICU on 3/2.  Blood cultures were positive for MRSA. 2D echo negative for vegetations, pt refused TEE.  Pt also noted to have swollen and painful R knee.  Seen by ID and treated with Vancomycin.     Per nursing, pt was fairly awake and oriented 3/27 and eating meals.  Around 4pm became more somnolent with worsening respiratory status, hypotension and fever to 102F.  Repeat CXR showed increased central vascular congesion and new RML consolidation favored to be atelectasis.  PCCM consulted for worsening respiratory status and patient was transferred to the ICU and intubated.  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 Multifocal bilateral pulmonary peripheral nodularities, the larger with central cavitation. 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 Stable T10 lytic lesion, likely a benign hemangioma. T11-L1 smaller lytic lesions, possibly  degenerative.  MRI spine 3/19 > limited exam due to motion, no acute abnormality in the lumbar spine  3/28 CT head > no acute abnormality   Micro Data:  COVID 3/18 >> negative  Influenza A/B 3/18 >> negative BCID 3/18 >> staph species detected  BCx2 3/18 >> MRSA  BCx2 3/20 >> MRSA  BCx2 3/23 >> MRSA  BCx2 3/25 >>  BCx2 3/28 >> Synovial Knee Fluid 3/28 >>  Tracheal Aspirate 3/28 >>   Antimicrobials:  Zosyn 3/18 > 3/19 Vancomycin 3/18 > Cefepime 3/27 >  Interim history/subjective:  RN reports concern for bleeding from NGT, receiving 1 unit PRBC, pressures soft on propofol, triglycerides elevated   Pt weaning on PSV 12/5, 40% Glucose range 287-307 Tmax 102 / WBC 21.8  I/O - 350 ml drainage from NGT, +802 ml in last 24h  Objective   Blood pressure (!) 107/54, pulse 76, temperature 99.5 F (37.5 C), temperature source Axillary, resp. rate (!) 31, height 5\' 8"  (1.727 m), weight 122.8 kg, SpO2 93 %.    Vent Mode: CPAP;PSV FiO2 (%):  [40 %-50 %] 40 % Set Rate:  [16 bmp-24 bmp] 24 bmp Vt Set:  [530 mL] 530 mL PEEP:  [5 cmH20] 5 cmH20 Pressure Support:  [12 cmH20] 12 cmH20 Plateau Pressure:  [15 cmH20-21 cmH20] 16 cmH20   Intake/Output Summary (Last 24 hours) at 01/06/2020 0806 Last data filed at 01/06/2020 0600 Gross per 24 hour  Intake 1010.42 ml  Output 300 ml  Net 710.42 ml   Filed Weights   01/04/2020 0915 01/05/20 0500 01/06/20 0441  Weight: 123.2 kg 119.2 kg  122.8 kg   General: critically ill appearing adult male lying in bed in NAD HEENT: MM pink/moist, ETT, OGT with bloody drainage  Neuro: sedate on propofol, raises eye brows to voice,pupils =/reactive, anicteric  CV: s1s2 RRR, SR in 70's, no m/r/g PULM:  Non-labored on vent, lungs bilaterally coarse with diminished air movement in bases  GI: soft, obese, bsx4 active Extremities: hot to touch/dry, no edema, right knee erythematous  Skin: no rashes or lesions  Resolved Hospital Problem list     Assessment &  Plan:   Septic Shock in setting of MRSA Bacteremia  Initially thought related to infected perm cath (removed), other sources include PNA (suspect findings are septic emboli) and septic joint with right knee effusion.  TTE negative but pt previously refused TEE b/c he did not want further IV sticks. Component of ABLA. -continue vasopressin, levophed for MAP >65 -repeat lactic acid  -appreciate ID assistance with patient care -abx as above  -follow cultures to maturity  -continue stress dose steroids  -will need TEE, have discussed with Cardiology  -stress dose steroids  -appreciate Ortho input -continue midodrine  Acute on Chronic Hypoxic Respiratory Failure Bilateral MRSA Septic Emboli  OSA  Failed trial of CPAP and required intubation -PRVC 8cc/kg  -wean PEEP / FiO2 for sats >90% -daily SBT / WUA -follow intermittent CXR -abx as above   Concern for GIB  Bloody secretions from NGT, Hgb drop from 7.6 to 6.5, s/p 1 unit PRBC 3/29 -GI consulted, appreciate evaluation  -PPI gtt  -hold TF  -hold SQ heparin with concern for bleeding   Acute Metabolic Encephalopathy Suspect in setting of sepsis superimposed on ESRD, anemia  -will need MRI brain once stabilized to rule out mycotic process  -change PAD to precedex, discontinue propofol (BP + triglycerides)  ESRD MWF HD  -appreciate Nephrology  -may need temporary HD cath if does not tolerate CVVH -Trend BMP / urinary output -Replace electrolytes as indicated  Acute Blood Loss on Chronic Anemia -Trend CBC -transfuse for Hgb <7% or active bleeding   Type 2 DM Hyperglycemia  Exacerbated by stress dose steroids  -Increase lantus to 8 units BID  -SSI, resistant scale    Best practice:  Diet: NPO Pain/Anxiety/Delirium protocol (if indicated): PAD protocol VAP protocol (if indicated): in place  DVT prophylaxis: SCD's  GI prophylaxis: protonix Glucose control: SSI Mobility: BR Code Status: Limited Code, no CPR / ACLS.   Prior discussion with patient's sister who is primary decision maker, stated that she would like a trial of ventilator support, though if patient is not improving does not think he would want prolonged life support Family Communication: Family updated at bedside am 3/29 on plan of care.  Disposition: ICU  Labs   CBC: Recent Labs  Lab 01/02/20 0054 01/02/20 1607 12/14/2019 0333 01/04/20 0328 01/04/20 2327 01/05/20 0327 01/06/20 0415  WBC 41.6*   < > 36.0* 29.7* 29.9* 28.4* 21.8*  NEUTROABS 33.2*  --  28.8* 24.0*  --  22.5* 17.6*  HGB 8.9*   < > 8.5* 7.6* 7.6* 7.3* 6.5*  HCT 27.0*   < > 26.6* 24.7* 23.8* 23.7* 21.1*  MCV 87.9   < > 88.7 91.5 90.8 92.6 89.4  PLT 373   < > 354 328 350 344 314   < > = values in this interval not displayed.    Basic Metabolic Panel: Recent Labs  Lab 12/27/2019 0333 01/04/20 0328 01/04/20 2327 01/05/20 0327 01/06/20 0415  NA 135 134* 136 133*  137  K 4.4 4.1 4.8 5.0 4.9  CL 96* 93* 95* 92* 98  CO2 22 22 22  18* 20*  GLUCOSE 186* 207* 240* 320* 307*  BUN 61* 36* 55* 60* 87*  CREATININE 5.79* 4.24* 5.90* 6.02* 7.71*  CALCIUM 9.0 9.0 9.0 8.8* 8.4*  MG  --   --  2.0  --   --   PHOS 6.0* 4.7* 5.5* 6.5* 7.0*   GFR: Estimated Creatinine Clearance: 13.8 mL/min (A) (by C-G formula based on SCr of 7.71 mg/dL (H)). Recent Labs  Lab 01/02/20 1704 01/02/20 1815 12/20/2019 0333 01/04/20 0328 01/04/20 2327 01/05/20 0327 01/06/20 0415  WBC  --   --    < > 29.7* 29.9* 28.4* 21.8*  LATICACIDVEN 1.9 1.5  --   --  1.9 3.3*  --    < > = values in this interval not displayed.    Liver Function Tests: Recent Labs  Lab 01/02/20 1607 01/02/20 1607 01/02/2020 0333 01/04/20 0328 01/04/20 2327 01/05/20 0327 01/06/20 0415  AST 87*  --   --   --  95*  --   --   ALT 30  --   --   --  35  --   --   ALKPHOS 256*  --   --   --  279*  --   --   BILITOT 1.3*  --   --   --  2.0*  --   --   PROT 7.7  --   --   --  8.3*  --   --   ALBUMIN 1.7*   < > 1.7* 1.7* 1.8*  1.7* 1.6*   < > = values in this interval not displayed.   No results for input(s): LIPASE, AMYLASE in the last 168 hours. Recent Labs  Lab 01/02/20 1704  AMMONIA 31    ABG    Component Value Date/Time   PHART 7.290 (L) 01/04/2020 1907   PCO2ART 57.8 (H) 01/04/2020 1907   PO2ART 52.7 (L) 01/04/2020 1907   HCO3 17.7 (L) 01/05/2020 0526   TCO2 22 12/27/2019 2245   ACIDBASEDEF 8.4 (H) 01/05/2020 0526   O2SAT 64.7 01/05/2020 0526     Coagulation Profile: No results for input(s): INR, PROTIME in the last 168 hours.  Cardiac Enzymes: No results for input(s): CKTOTAL, CKMB, CKMBINDEX, TROPONINI in the last 168 hours.  HbA1C: Hemoglobin A1C  Date/Time Value Ref Range Status  08/15/2019 02:41 PM 7.0 (A) 4.0 - 5.6 % Final  04/29/2019 03:32 PM 9.1 (A) 4.0 - 5.6 % Final  01/16/2017 12:00 AM 8.1  Final   Hgb A1c MFr Bld  Date/Time Value Ref Range Status  12/27/2019 04:34 AM 7.1 (H) 4.8 - 5.6 % Final    Comment:    (NOTE) Pre diabetes:          5.7%-6.4% Diabetes:              >6.4% Glycemic control for   <7.0% adults with diabetes   07/03/2015 02:55 PM 8.0 (H) 4.8 - 5.6 % Final    Comment:    (NOTE)         Pre-diabetes: 5.7 - 6.4         Diabetes: >6.4         Glycemic control for adults with diabetes: <7.0     CBG: Recent Labs  Lab 01/05/20 1236 01/05/20 1623 01/05/20 2051 01/05/20 2351 01/06/20 0423  GLUCAP 372* 360* 291* 295* 289*    Critical care  time: 37 minutes     Noe Gens, MSN, NP-C [facility] 01/06/2020, 11:19 AM   Please see [email or web address] for pager details.

## 2020-01-06 NOTE — Progress Notes (Addendum)
Noted "redish" in color bile coming out from OG tube. RN alerted E-link.  No new orders. Will continue to monitor.

## 2020-01-06 NOTE — Progress Notes (Signed)
Nutrition Brief Note  Chart reviewed. Pt now transitioning to comfort care.  No further nutrition interventions planned at this time.  Please re-consult as needed.   Damilola Flamm P., RD, LDN, CNSC See AMiON for contact information    

## 2020-01-06 NOTE — Progress Notes (Signed)
Family at bedside with chaplain to pray. Ready to proceed with comfort measures.Morphine drip to b prepared by pharmacy. Comfort measures on going.

## 2020-01-06 NOTE — Progress Notes (Signed)
[facility] ROUNDING NOTE   Subjective:   54 year old gentleman with clinical decompensation from septic shock and recurrent MRSA bacteremia.  He was intubated transferred to the intensive care unit with presumptive subacute bacterial endocarditis.  And septic arthritis of the right knee.  He is end-stage renal disease Monday Wednesday Friday dialysis.  And underwent dialysis successfully Friday without volume removal.  He remains pressor dependent.  Decision has been made for DO NOT RESUSCITATE.  Blood pressure 110/54 pulse 76 temperature 99.5 O2 sats 90% FiO2 40%  IV vasopressin IV norepinephrine  Sodium 137 potassium 4.9 chloride 98 CO2 20 BUN 87 creatinine 7.7 glucose 307 calcium 8.4 albumin 1.6 phosphorus 7 WBC 21.8 hemoglobin 6.5  Auryxia 420 mg 3 times daily with meals Solu-Cortef 50 mg 6 hours insulin sliding scale ProAmatine 50 mg 3 times daily  Objective:  Vital signs in last 24 hours:  Temp:  [99.1 F (37.3 C)-101.6 F (38.7 C)] 99.5 F (37.5 C) (03/29 0647) Pulse Rate:  [67-94] 73 (03/29 0647) Resp:  [16-26] 16 (03/29 0644) BP: (95-146)/(49-62) 107/54 (03/29 0647) SpO2:  [93 %-98 %] 93 % (03/29 0647) FiO2 (%):  [40 %-50 %] 40 % (03/29 0647) Weight:  [122.8 kg] 122.8 kg (03/29 0441)  Weight change: 3.6 kg Filed Weights   01/08/2020 0915 01/05/20 0500 01/06/20 0441  Weight: 123.2 kg 119.2 kg 122.8 kg    Intake/Output: I/O last 3 completed shifts: In: 1507.9 [I.V.:833.9; NG/GT:205; IV Piggyback:469] Out: 350 [Emesis/NG output:350]   Intake/Output this shift:  No intake/output data recorded.  Gen: Intubated, sedated, appears comfortable CVS: Pulse regular rhythm, normal rate, S1 and S2 normal Resp: Anteriorly clear to auscultation, no rales/rhonchi Abd: Soft, obese, nontender Ext: No lower extremity edema, left upper arm AV fistula with palpable thrill.  Mild right knee swelling noted-nontender.   Basic Metabolic Panel: Recent Labs  Lab  01/02/2020 0333 12/29/2019 0333 01/04/20 0328 01/04/20 0328 01/04/20 2327 01/05/20 0327 01/06/20 0415  NA 135  --  134*  --  136 133* 137  K 4.4  --  4.1  --  4.8 5.0 4.9  CL 96*  --  93*  --  95* 92* 98  CO2 22  --  22  --  22 18* 20*  GLUCOSE 186*  --  207*  --  240* 320* 307*  BUN 61*  --  36*  --  55* 60* 87*  CREATININE 5.79*  --  4.24*  --  5.90* 6.02* 7.71*  CALCIUM 9.0   < > 9.0   < > 9.0 8.8* 8.4*  MG  --   --   --   --  2.0  --   --   PHOS 6.0*  --  4.7*  --  5.5* 6.5* 7.0*   < > = values in this interval not displayed.    Liver Function Tests: Recent Labs  Lab 01/02/20 1607 01/02/20 1607 12/21/2019 0333 01/04/20 0328 01/04/20 2327 01/05/20 0327 01/06/20 0415  AST 87*  --   --   --  95*  --   --   ALT 30  --   --   --  35  --   --   ALKPHOS 256*  --   --   --  279*  --   --   BILITOT 1.3*  --   --   --  2.0*  --   --   PROT 7.7  --   --   --  8.3*  --   --  ALBUMIN 1.7*   < > 1.7* 1.7* 1.8* 1.7* 1.6*   < > = values in this interval not displayed.   No results for input(s): LIPASE, AMYLASE in the last 168 hours. Recent Labs  Lab 01/02/20 1704  AMMONIA 31    CBC: Recent Labs  Lab 01/02/20 0054 01/02/20 1607 12/25/2019 0333 01/04/20 0328 01/04/20 2327 01/05/20 0327 01/06/20 0415  WBC 41.6*   < > 36.0* 29.7* 29.9* 28.4* 21.8*  NEUTROABS 33.2*  --  28.8* 24.0*  --  22.5* 17.6*  HGB 8.9*   < > 8.5* 7.6* 7.6* 7.3* 6.5*  HCT 27.0*   < > 26.6* 24.7* 23.8* 23.7* 21.1*  MCV 87.9   < > 88.7 91.5 90.8 92.6 89.4  PLT 373   < > 354 328 350 344 314   < > = values in this interval not displayed.    Cardiac Enzymes: No results for input(s): CKTOTAL, CKMB, CKMBINDEX, TROPONINI in the last 168 hours.  BNP: Invalid input(s): POCBNP  CBG: Recent Labs  Lab 01/05/20 1236 01/05/20 1623 01/05/20 2051 01/05/20 2351 01/06/20 0423  GLUCAP 372* 360* 291* 295* 12*    Microbiology: Results for orders placed or performed during the hospital encounter of 12/13/2019   Blood Culture (routine x 2)     Status: Abnormal   Collection Time: 12/31/2019 10:15 PM   Specimen: BLOOD RIGHT ARM  Result Value Ref Range Status   Specimen Description BLOOD RIGHT ARM  Final   Special Requests   Final    BOTTLES DRAWN AEROBIC AND ANAEROBIC Blood Culture results may not be optimal due to an inadequate volume of blood received in culture bottles   Culture  Setup Time   Final    GRAM POSITIVE COCCI IN CLUSTERS IN BOTH AEROBIC AND ANAEROBIC BOTTLES CRITICAL RESULT CALLED TO, READ BACK BY AND VERIFIED WITH: J. Roselie Skinner, AT 1009 12/27/19 DV Performed at [facility], [place] [address], [place], [place] [address]    Culture METHICILLIN RESISTANT STAPHYLOCOCCUS AUREUS (A)  Final   Report Status 12/29/2019 FINAL  Final   Organism ID, Bacteria METHICILLIN RESISTANT STAPHYLOCOCCUS AUREUS  Final      Susceptibility   Methicillin resistant staphylococcus aureus - MIC*    CIPROFLOXACIN <=0.5 SENSITIVE Sensitive     ERYTHROMYCIN >=8 RESISTANT Resistant     GENTAMICIN <=0.5 SENSITIVE Sensitive     OXACILLIN >=4 RESISTANT Resistant     TETRACYCLINE <=1 SENSITIVE Sensitive     VANCOMYCIN 1 SENSITIVE Sensitive     TRIMETH/SULFA <=10 SENSITIVE Sensitive     CLINDAMYCIN <=0.25 SENSITIVE Sensitive     RIFAMPIN <=0.5 SENSITIVE Sensitive     Inducible Clindamycin NEGATIVE Sensitive     * METHICILLIN RESISTANT STAPHYLOCOCCUS AUREUS  Blood Culture (routine x 2)     Status: Abnormal   Collection Time: 12/16/2019 10:41 PM   Specimen: BLOOD RIGHT HAND  Result Value Ref Range Status   Specimen Description BLOOD RIGHT HAND  Final   Special Requests AEROBIC BOTTLE ONLY Blood Culture adequate volume  Final   Culture  Setup Time   Final    GRAM POSITIVE COCCI IN CLUSTERS AEROBIC BOTTLE ONLY CRITICAL VALUE NOTED.  VALUE IS CONSISTENT WITH PREVIOUSLY REPORTED AND CALLED VALUE.    Culture (A)  Final    STAPHYLOCOCCUS AUREUS SUSCEPTIBILITIES PERFORMED ON PREVIOUS CULTURE WITHIN THE LAST 5  DAYS. Performed at [facility], [place] [address], [place], [place] [address]    Report Status 12/29/2019 FINAL  Final  Blood Culture ID Panel (Reflexed)     Status: Abnormal   Collection Time: 01/04/2020 10:41 PM  Result Value Ref Range Status   Enterococcus species NOT DETECTED NOT DETECTED Final   Listeria monocytogenes NOT DETECTED NOT DETECTED Final   Staphylococcus species DETECTED (A) NOT DETECTED Final    Comment: CRITICAL RESULT CALLED TO, READ BACK BY AND VERIFIED WITH: Andres Shad PharmD 14:30 12/27/19 ([email or web address])    Staphylococcus aureus (BCID) DETECTED (A) NOT DETECTED Final    Comment: Methicillin (oxacillin)-resistant Staphylococcus aureus (MRSA). MRSA is predictably resistant to beta-lactam antibiotics (except ceftaroline). Preferred therapy is vancomycin unless clinically contraindicated. Patient requires contact precautions if  hospitalized. CRITICAL RESULT CALLED TO, READ BACK BY AND VERIFIED WITH: Andres Shad PharmD 14:30 12/27/19 ([email or web address])    Methicillin resistance DETECTED (A) NOT DETECTED Final    Comment: CRITICAL RESULT CALLED TO, READ BACK BY AND VERIFIED WITH: Andres Shad PharmD 14:30 12/27/19 ([email or web address])    Streptococcus species NOT DETECTED NOT DETECTED Final   Streptococcus agalactiae NOT DETECTED NOT DETECTED Final   Streptococcus pneumoniae NOT DETECTED NOT DETECTED Final   Streptococcus pyogenes NOT DETECTED NOT DETECTED Final   Acinetobacter baumannii NOT DETECTED NOT DETECTED Final   Enterobacteriaceae species NOT DETECTED NOT DETECTED Final   Enterobacter cloacae complex NOT DETECTED NOT DETECTED Final   Escherichia coli NOT DETECTED NOT DETECTED Final   Klebsiella oxytoca NOT DETECTED NOT DETECTED Final   Klebsiella pneumoniae NOT DETECTED NOT DETECTED Final   Proteus species NOT DETECTED NOT DETECTED Final   Serratia marcescens NOT DETECTED NOT DETECTED Final   Haemophilus influenzae NOT DETECTED NOT DETECTED Final   Neisseria meningitidis NOT DETECTED NOT  DETECTED Final   Pseudomonas aeruginosa NOT DETECTED NOT DETECTED Final   Candida albicans NOT DETECTED NOT DETECTED Final   Candida glabrata NOT DETECTED NOT DETECTED Final   Candida krusei NOT DETECTED NOT DETECTED Final   Candida parapsilosis NOT DETECTED NOT DETECTED Final   Candida tropicalis NOT DETECTED NOT DETECTED Final    Comment: Performed at [facility], [place]. [address] [place], [place] [address]  Respiratory Panel by RT PCR (Flu A&B, Covid) - Nasopharyngeal Swab     Status: None   Collection Time: 12/24/2019 11:22 PM   Specimen: Nasopharyngeal Swab  Result Value Ref Range Status   SARS Coronavirus 2 by RT PCR NEGATIVE NEGATIVE Final    Comment: (NOTE) SARS-CoV-2 target nucleic acids are NOT DETECTED. The SARS-CoV-2 RNA is generally detectable in upper respiratoy specimens during the acute phase of infection. The lowest concentration of SARS-CoV-2 viral copies this assay can detect is 131 copies/mL. A negative result does not preclude SARS-Cov-2 infection and should not be used as the sole basis for treatment or other patient management decisions. A negative result may occur with  improper specimen collection/handling, submission of specimen other than nasopharyngeal swab, presence of viral mutation(s) within the areas targeted by this assay, and inadequate number of viral copies (<131 copies/mL). A negative result must be combined with clinical observations, patient history, and epidemiological information. The expected result is Negative. Fact Sheet for Patients:  PinkCheek.be Fact Sheet for Healthcare Providers:  GravelBags.it This test is not yet ap proved or cleared by the [place] FDA and  has been authorized for detection and/or diagnosis of SARS-CoV-2 by FDA under an Emergency Use Authorization (EUA). This EUA will remain  in effect (meaning this test can be used) for the duration of the COVID-19  declaration under Section 564(b)(1)  of the Act, 21 U.S.C. section 360bbb-3(b)(1), unless the authorization is terminated or revoked sooner.    Influenza A by PCR NEGATIVE NEGATIVE Final   Influenza B by PCR NEGATIVE NEGATIVE Final    Comment: (NOTE) The Xpert Xpress SARS-CoV-2/FLU/RSV assay is intended as an aid in  the diagnosis of influenza from Nasopharyngeal swab specimens and  should not be used as a sole basis for treatment. Nasal washings and  aspirates are unacceptable for Xpert Xpress SARS-CoV-2/FLU/RSV  testing. Fact Sheet for Patients: PinkCheek.be Fact Sheet for Healthcare Providers: GravelBags.it This test is not yet approved or cleared by the [place] FDA and  has been authorized for detection and/or diagnosis of SARS-CoV-2 by  FDA under an Emergency Use Authorization (EUA). This EUA will remain  in effect (meaning this test can be used) for the duration of the  Covid-19 declaration under Section 564(b)(1) of the Act, 21  U.S.C. section 360bbb-3(b)(1), unless the authorization is  terminated or revoked. Performed at [facility], [place] [address] [place], [place] [address]   MRSA PCR Screening     Status: Abnormal   Collection Time: 12/27/19  5:30 AM   Specimen: Nasopharyngeal  Result Value Ref Range Status   MRSA by PCR POSITIVE (A) NEGATIVE Final    Comment:        The GeneXpert MRSA Assay (FDA approved for NASAL specimens only), is one component of a comprehensive MRSA colonization surveillance program. It is not intended to diagnose MRSA infection nor to guide or monitor treatment for MRSA infections. RESULT CALLED TO, READ BACK BY AND VERIFIED WITH: RN Sarajane Jews [id number] FCP Performed at [place] [address] [place], [place] [address]   Culture, blood (routine x 2)     Status: Abnormal   Collection Time: 12/28/19  5:40 AM   Specimen: BLOOD RIGHT HAND  Result Value  Ref Range Status   Specimen Description BLOOD RIGHT HAND  Final   Special Requests   Final    BOTTLES DRAWN AEROBIC AND ANAEROBIC Blood Culture adequate volume   Culture  Setup Time   Final    IN BOTH AEROBIC AND ANAEROBIC BOTTLES GRAM POSITIVE COCCI IN CLUSTERS CRITICAL VALUE NOTED.  VALUE IS CONSISTENT WITH PREVIOUSLY REPORTED AND CALLED VALUE.    Culture (A)  Final    STAPHYLOCOCCUS AUREUS SUSCEPTIBILITIES PERFORMED ON PREVIOUS CULTURE WITHIN THE LAST 5 DAYS. Performed at [facility], [place] [address] [place], [place] [address]    Report Status 01/02/2020 FINAL  Final  Culture, blood (routine x 2)     Status: Abnormal   Collection Time: 12/28/19  5:59 AM   Specimen: BLOOD RIGHT HAND  Result Value Ref Range Status   Specimen Description BLOOD RIGHT HAND  Final   Special Requests IN PEDIATRIC BOTTLE Blood Culture adequate volume  Final   Culture  Setup Time   Final    GRAM POSITIVE COCCI IN PEDIATRIC BOTTLE CRITICAL RESULT CALLED TO, READ BACK BY AND VERIFIED WITH: PHARMD LINDSAY CHEN '@2021'$  12/28/2019 AKT    Culture (A)  Final    STAPHYLOCOCCUS AUREUS SUSCEPTIBILITIES PERFORMED ON PREVIOUS CULTURE WITHIN THE LAST 5 DAYS. Performed at [facility], [place] [address] [place], [place] [address]    Report Status 12/29/2019 FINAL  Final  Culture, blood (routine x 2)     Status: Abnormal   Collection Time: 12/30/19  6:35 AM   Specimen: BLOOD  Result Value Ref Range Status  Specimen Description BLOOD RIGHT ANTECUBITAL  Final   Special Requests   Final    BOTTLES DRAWN AEROBIC AND ANAEROBIC Blood Culture adequate volume   Culture  Setup Time   Final    GRAM POSITIVE COCCI AEROBIC BOTTLE ONLY CRITICAL VALUE NOTED.  VALUE IS CONSISTENT WITH PREVIOUSLY REPORTED AND CALLED VALUE.    Culture (A)  Final    STAPHYLOCOCCUS AUREUS SUSCEPTIBILITIES PERFORMED ON PREVIOUS CULTURE WITHIN THE LAST 5 DAYS. Performed at [facility], [place] [address] [place], [place] [address]     Report Status 01/04/2020 FINAL  Final  Culture, blood (routine x 2)     Status: Abnormal   Collection Time: 12/30/19  6:40 AM   Specimen: BLOOD RIGHT HAND  Result Value Ref Range Status   Specimen Description BLOOD RIGHT HAND  Final   Special Requests   Final    BOTTLES DRAWN AEROBIC AND ANAEROBIC Blood Culture adequate volume   Culture  Setup Time   Final    GRAM POSITIVE COCCI IN CLUSTERS IN BOTH AEROBIC AND ANAEROBIC BOTTLES CRITICAL VALUE NOTED.  VALUE IS CONSISTENT WITH PREVIOUSLY REPORTED AND CALLED VALUE.    Culture METHICILLIN RESISTANT STAPHYLOCOCCUS AUREUS (A)  Final   Report Status 01/02/2020 FINAL  Final   Organism ID, Bacteria METHICILLIN RESISTANT STAPHYLOCOCCUS AUREUS  Final      Susceptibility   Methicillin resistant staphylococcus aureus - MIC*    CIPROFLOXACIN <=0.5 SENSITIVE Sensitive     ERYTHROMYCIN >=8 RESISTANT Resistant     GENTAMICIN <=0.5 SENSITIVE Sensitive     OXACILLIN >=4 RESISTANT Resistant     TETRACYCLINE <=1 SENSITIVE Sensitive     VANCOMYCIN 1 SENSITIVE Sensitive     TRIMETH/SULFA <=10 SENSITIVE Sensitive     CLINDAMYCIN <=0.25 SENSITIVE Sensitive     RIFAMPIN <=0.5 SENSITIVE Sensitive     Inducible Clindamycin NEGATIVE Sensitive     * METHICILLIN RESISTANT STAPHYLOCOCCUS AUREUS  Culture, blood (routine x 2)     Status: None   Collection Time: 12/31/19 11:09 AM   Specimen: BLOOD RIGHT HAND  Result Value Ref Range Status   Specimen Description BLOOD RIGHT HAND  Final   Special Requests AEROBIC BOTTLE ONLY Blood Culture adequate volume  Final   Culture   Final    NO GROWTH 5 DAYS Performed at [facility], [address], [place], [place] [address]    Report Status 01/05/2020 FINAL  Final  Culture, blood (routine x 2)     Status: Abnormal   Collection Time: 12/31/19 11:23 AM   Specimen: BLOOD RIGHT HAND  Result Value Ref Range Status   Specimen Description BLOOD RIGHT HAND  Final   Special Requests   Final    AEROBIC BOTTLE ONLY  Blood Culture results may not be optimal due to an inadequate volume of blood received in culture bottles   Culture  Setup Time   Final    AEROBIC BOTTLE ONLY GRAM POSITIVE COCCI CRITICAL VALUE NOTED.  VALUE IS CONSISTENT WITH PREVIOUSLY REPORTED AND CALLED VALUE.    Culture (A)  Final    STAPHYLOCOCCUS AUREUS SUSCEPTIBILITIES PERFORMED ON PREVIOUS CULTURE WITHIN THE LAST 5 DAYS. Performed at [facility], [place] [address] [place],  [address]    Report Status 12/19/2019 FINAL  Final  Culture, blood (Routine X 2) w Reflex to ID Panel     Status: None (Preliminary result)   Collection Time: 01/02/20 12:40 AM   Specimen: BLOOD RIGHT HAND  Result Value  Ref Range Status   Specimen Description BLOOD RIGHT HAND  Final   Special Requests AEROBIC BOTTLE ONLY Blood Culture adequate volume  Final   Culture   Final    NO GROWTH 3 DAYS Performed at [facility], [address], [place], [place] [address]    Report Status PENDING  Incomplete  Culture, blood (Routine X 2) w Reflex to ID Panel     Status: None (Preliminary result)   Collection Time: 01/02/20 12:50 AM   Specimen: BLOOD RIGHT HAND  Result Value Ref Range Status   Specimen Description BLOOD RIGHT HAND  Final   Special Requests   Final    BOTTLES DRAWN AEROBIC AND ANAEROBIC Blood Culture adequate volume   Culture   Final    NO GROWTH 3 DAYS Performed at [facility], [place] [address], [place], [place] [address]    Report Status PENDING  Incomplete  Culture, respiratory (non-expectorated)     Status: None (Preliminary result)   Collection Time: 01/05/20 12:40 AM   Specimen: Tracheal Aspirate; Respiratory  Result Value Ref Range Status   Specimen Description TRACHEAL ASPIRATE  Final   Special Requests NONE  Final   Gram Stain   Final    MODERATE WBC PRESENT, PREDOMINANTLY PMN ABUNDANT GRAM POSITIVE COCCI IN PAIRS MODERATE GRAM NEGATIVE RODS MODERATE GRAM NEGATIVE COCCOBACILLI FEW GRAM NEGATIVE  DIPLOCOCCI NO SQUAMOUS EPITHELIAL CELLS PRESENT Performed at [facility], [place] [address], [place], [place] [address]    Culture PENDING  Incomplete   Report Status PENDING  Incomplete  Culture, blood (routine x 2)     Status: None (Preliminary result)   Collection Time: 01/05/20  2:32 AM   Specimen: BLOOD  Result Value Ref Range Status   Specimen Description BLOOD RIGHT HAND  Final   Special Requests   Final    BOTTLES DRAWN AEROBIC AND ANAEROBIC Blood Culture adequate volume   Culture   Final    NO GROWTH <12 HOURS Performed at [facility], [address], [place], [place] [address]    Report Status PENDING  Incomplete  Culture, blood (routine x 2)     Status: None (Preliminary result)   Collection Time: 01/05/20  2:33 AM   Specimen: BLOOD  Result Value Ref Range Status   Specimen Description BLOOD RIGHT HAND  Final   Special Requests   Final    BOTTLES DRAWN AEROBIC AND ANAEROBIC Blood Culture adequate volume   Culture   Final    NO GROWTH <12 HOURS Performed at [facility], [place] [address] [place], [place] [address]    Report Status PENDING  Incomplete  Body fluid culture     Status: None (Preliminary result)   Collection Time: 01/05/20  3:11 AM   Specimen: Body Fluid  Result Value Ref Range Status   Specimen Description FLUID SYNOVIAL  Final   Special Requests NONE  Final   Gram Stain   Final    MODERATE WBC PRESENT, PREDOMINANTLY PMN RARE GRAM POSITIVE COCCI IN PAIRS Performed at [facility], [place] [address], [place], [place] [address]    Culture PENDING  Incomplete   Report Status PENDING  Incomplete    Coagulation Studies: No results for input(s): LABPROT, INR in the last 72 hours.  Urinalysis: No results for input(s): COLORURINE, LABSPEC, PHURINE, GLUCOSEU, HGBUR, BILIRUBINUR, KETONESUR, PROTEINUR, UROBILINOGEN, NITRITE, LEUKOCYTESUR in the last 72 hours.  Invalid input(s): APPERANCEUR    Imaging: CT  HEAD WO CONTRAST  Result  Date: 01/05/2020 CLINICAL DATA:  Encephalopathy. EXAM: CT HEAD WITHOUT CONTRAST TECHNIQUE: Contiguous axial images were obtained from the base of the skull through the vertex without intravenous contrast. COMPARISON:  Head CT 05/28/2019 FINDINGS: Brain: No intracranial hemorrhage, mass effect, or midline shift. No hydrocephalus. The basilar cisterns are patent. No evidence of territorial infarct or acute ischemia. No extra-axial or intracranial fluid collection. Vascular: Atherosclerosis of skullbase vasculature without hyperdense vessel or abnormal calcification. Skull: No fracture or focal lesion. Sinuses/Orbits: Opacification of posterior ethmoid air cells may be secondary to intubation. Endotracheal and enteric tubes in place. Mastoid air cells are clear. Other: None. IMPRESSION: No acute intracranial abnormality. Electronically Signed   By: Keith Rake M.D.   On: 01/05/2020 02:15   DG CHEST PORT 1 VIEW  Result Date: 01/06/2020 CLINICAL DATA:  Intubation EXAM: PORTABLE CHEST 1 VIEW COMPARISON:  Yesterday FINDINGS: The endotracheal tube tip is 2.7 cm above the carina. The enteric tube at least reaches the stomach. Low volume chest with streaky and indistinct opacities on both sides, greatest at the right base where there has been improvement in aeration with now visualized diaphragm. No visible edema, effusion, or pneumothorax. Normal heart size when accounting for rotation IMPRESSION: 1. Stable hardware positioning. 2. Bilateral infiltrate with improved aeration at the right base. Electronically Signed   By: Monte Fantasia M.D.   On: 01/06/2020 05:09   DG CHEST PORT 1 VIEW  Result Date: 01/05/2020 CLINICAL DATA:  Endotracheal tube placement. EXAM: PORTABLE CHEST 1 VIEW COMPARISON:  Radiograph yesterday. CT 12/11/2019 FINDINGS: Endotracheal tube tip 2.8 cm from the carina. Enteric tube in place with tip and side-port below the diaphragm. Progressive volume loss in the right hemithorax with  increasing opacity at the right lung base. There is rightward mediastinal shift, possibly accentuated by rotation. Vascular congestion with mild pulmonary edema. Patchy opacity at the left lung base. No pneumothorax. Cervicothoracic fusion hardware is partially included. IMPRESSION: 1. Endotracheal tube tip 2.8 cm from the carina. 2. Progressive volume loss in the right hemithorax with increasing opacity at the right lung base. Findings are suspicious for atelectasis/lobar collapse. 3. Vascular congestion with mild pulmonary edema. Patchy left lung base opacities likely atelectasis. Electronically Signed   By: Keith Rake M.D.   On: 01/05/2020 01:41   DG Chest Port 1 View  Result Date: 01/04/2020 CLINICAL DATA:  Hypoxia EXAM: PORTABLE CHEST 1 VIEW COMPARISON:  12/25/2019 FINDINGS: Single frontal view of the chest demonstrates stable postsurgical changes from spinal fusion. Cardiac silhouette is stable. Lung volumes are diminished, with prominent central vascular congestion. There is right middle lobe consolidation with obscuration of the right heart border. No large effusion or pneumothorax. IMPRESSION: 1. Low lung volumes. 2. Increased central vascular congestion. Accentuation of vessels could be due to low lung volumes, versus developing fluid overload. 3. New right middle lobe consolidation, favor atelectasis. Electronically Signed   By: Randa Ngo M.D.   On: 01/04/2020 18:51   DG Knee Right Port  Result Date: 01/05/2020 CLINICAL DATA:  Swelling and knee pain. EXAM: PORTABLE RIGHT KNEE - 1-2 VIEW COMPARISON:  None. FINDINGS: Normal anatomic alignment. No evidence for acute fracture or dislocation. Marked tricompartmental osteophytosis. Moderate-sized joint effusion. IMPRESSION: Marked tricompartmental degenerative changes. Moderate joint effusion. Electronically Signed   By: Lovey Newcomer M.D.   On: 01/05/2020 16:01   DG Abd Portable 1V  Result Date: 01/05/2020 CLINICAL DATA:  OG tube  placement. EXAM: PORTABLE ABDOMEN - 1 VIEW  COMPARISON:  No recent abdominal imaging. Chest CT 12/14/2019 FINDINGS: Tip and side port of the enteric tube below the diaphragm, likely in the stomach. Air in the upper abdomen is likely related to gaseous gastric distension, however more detailed assessment is limited given soft tissue attenuation from habitus. IMPRESSION: Tip and side port of the enteric tube below the diaphragm, likely in the stomach. Gaseous gastric distension suspected. Electronically Signed   By: Keith Rake M.D.   On: 01/05/2020 01:24     Medications:   . sodium chloride 10 mL/hr at 01/06/20 0600  . sodium chloride 100 mL (01/08/2020 0049)  . sodium chloride    . ceFEPime (MAXIPIME) IV Stopped (01/05/20 2152)  . norepinephrine (LEVOPHED) Adult infusion 7 mcg/min (01/06/20 0600)  . pantoprozole (PROTONIX) infusion 8 mg/hr (01/06/20 0657)  . propofol (DIPRIVAN) infusion 25 mcg/kg/min (01/06/20 0602)  . sodium chloride Stopped (01/05/20 0225)  . vancomycin Stopped (01/08/2020 1425)  . vasopressin (PITRESSIN) infusion - *FOR SHOCK* Stopped (01/06/20 0502)   . chlorhexidine gluconate (MEDLINE KIT)  15 mL Mouth Rinse BID  . Chlorhexidine Gluconate Cloth  6 each Topical Q0600  . ferric citrate  420 mg Oral TID WC  . heparin  5,000 Units Subcutaneous Q8H  . hydrocortisone sod succinate (SOLU-CORTEF) inj  50 mg Intravenous Q6H  . insulin aspart  0-20 Units Subcutaneous Q4H  . insulin glargine  10 Units Subcutaneous Daily  . lidocaine  15 mL Oral Once  . mouth rinse  15 mL Mouth Rinse 10 times per day  . midodrine  15 mg Oral TID WC   sodium chloride, sodium chloride, acetaminophen, acetaminophen, alteplase, fentaNYL (SUBLIMAZE) injection, fentaNYL (SUBLIMAZE) injection, heparin, lidocaine (PF), lidocaine-prilocaine, pentafluoroprop-tetrafluoroeth  Assessment/ Plan:  1. Septic shock with recurrent MRSA bacteremia: Clinical decompensation He is now on pressors requiring  vasopressin and norepinephrine.Marland Kitchen  Antibiotic therapy expanded to intravenous vancomycin and cefepime.  He likely has SBE (presumptive) and septic arthritis of right knee-agree with plan for TEE while intubated after hemodynamically stabilized. 2. ESRD: Usually on a Monday/Wednesday/Friday schedule as an outpatient and he underwent hemodialysis on Friday without any volume removal.    Goals of care addressed by team and is now DNR.  The question will be whether to initiate and escalate care with CRRT.  There appears to be no urgent need for dialysis at this present time.  We will wait for the discussion with critical care team. 3. Anemia: Hemoglobin and hematocrit remain low in the setting of recurrent infection/inflammatory complex.  Continue to monitor on ESA. 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 5. Nutrition: Low albumin likely secondary to infection/negative acute phase reaction; tube feeds per RD.    LOS: [place] '@TODAY''@7'$ :43 AM

## 2020-01-06 NOTE — Progress Notes (Addendum)
Patient Update:  Discussed plan of care at bedside with patient and two sisters.  They feel that Amareon has been struggling with his health issues in over 10 years of dialysis for a long time.  Obese have been recurrent problems.  They feel that he is suffering his decision to decline procedures and care was his way of telling us that he was getting tired.  We reviewed his disseminated MRSA action including septic emboli, septic joint, endocarditis.  He is in septic shock.  They would like for his mother to talk to him over the phone, and her multiple family members say goodbye and see him.  They would like to transition to comfort measures today. Will continue current level of care without escalation until this can be arranged.  Lenice Llamas, MD Pulmonary and [place] Pager: [place]

## 2020-01-06 NOTE — Progress Notes (Signed)
Pharmacy Antibiotic Note  Seth [name] is a 54 y.o. male admitted on 12/24/2019 with MRSA bacteremia. Pharmacy has been consulted for vancomycin dosing.   Patient is ESRD with regular HD MWF. Last HD was 3/26 ( ~ 4 hours) . Patient has now been transferred to the ICU with low blood pressures requiring pressor support. It appears that Seth [name] has no acute needs for HD at this moment in time and the decision as to whether to escalate care to CRRT is being considered.    Plan: F/U plans for renal replacement therapy for further Vancomycin doses  Patient also on cefepime- Will adjust to 1 gm q 24 for ESRD     Temp (24hrs), Avg:99.6 F (37.6 C), Min:99 F (37.2 C), Max:100.5 F (38.1 C)  Recent Labs  Lab 01/02/20 1607 01/02/20 1704 01/02/20 1815 12/15/2019 0333 01/04/20 0328 01/04/20 2327 01/05/20 0327 01/06/20 0415  WBC   < >  --   --  36.0* 29.7* 29.9* 28.4* 21.8*  CREATININE   < >  --   --  5.79* 4.24* 5.90* 6.02* 7.71*  LATICACIDVEN  --  1.9 1.5  --   --  1.9 3.3*  --   VANCORANDOM  --   --   --  27  --   --   --   --    < > = values in this interval not displayed.    Estimated Creatinine Clearance: 13.8 mL/min (A) (by C-G formula based on SCr of 7.71 mg/dL (H)).    Antimicrobials Vancomycin 3/18>> Pip/tazo 3/19  Microbiology 3/18 Bcx 2/2: MRSA  3/19 MRSA +  3/20 Bcx 2/2 MRSA 3/22 Bcx: 1/2 MRSA 3/23 BCx: 1/2 MRSA 3/25 BCX: NGTD    Jimmy Footman, PharmD, BCPS, [place] [facility] Clinical Pharmacist Phone: [phone] Please check AMION for all [place] phone numbers After 10:00 PM, call [place] [phone]

## 2020-01-06 NOTE — Progress Notes (Signed)
PT Cancellation Note  Patient Details Name: Seth [name] MRN: [id number] DOB: 09/13/1966   Cancelled Treatment:    Reason Eval/Treat Not Completed: (P) Medical issues which prohibited therapy Pt intubated, sedated and receiving blood transfusion. PT will check back to determine appropriateness of treatment tomorrow. Will consider discharge from PT service if continues to have medical issues prohibiting therapy.  Promise Bushong B. Migdalia Dk PT, DPT Acute Rehabilitation Services Pager [phone] Office [phone]    [place] 01/06/2020, 10:13 AM

## 2020-01-06 NOTE — Progress Notes (Signed)
Inpatient Diabetes Program Recommendations  AACE/ADA: New Consensus Statement on Inpatient Glycemic Control (2015)  Target Ranges:  Prepandial:   less than 140 mg/dL      Peak postprandial:   less than 180 mg/dL (1-2 hours)      Critically ill patients:  140 - 180 mg/dL   Results for Seth [name], Seth [name] (MRN [id number]) as of 01/06/2020 07:20  Ref. Range 01/05/2020 00:31 01/05/2020 04:55 01/05/2020 08:11 01/05/2020 10:15 01/05/2020 12:36 01/05/2020 16:23 01/05/2020 20:51  Glucose-Capillary Latest Ref Range: 70 - 99 mg/dL 278 (H) 304 (H)  3 units NOVOLOG 345 (H)  4 units NOVOLOG  365 (H) 372 (H)  20 units NOVOLOG +  10 units LANTUS started at 2:30pm  360 (H)  20 units NOVOLOG  291 (H)  7 units NOVOLOG    Results for Seth [name], Seth [name] (MRN [id number]) as of 01/06/2020 07:20  Ref. Range 01/05/2020 23:51 01/06/2020 04:23  Glucose-Capillary Latest Ref Range: 70 - 99 mg/dL 295 (H)  11 units NOVOLOG  289 (H)  11 units NOVOLOG     Results for Seth [name], Seth [name] (MRN [id number]) as of 01/06/2020 07:20  Ref. Range 08/15/2019 14:41 12/27/2019 04:34  Hemoglobin A1C Latest Ref Range: 4.8 - 5.6 % 7.0 (A) 7.1 (H)    Admit with: Presented with back pain and AMS 3/19 and was admitted with presumed septic shock from unclear source, but possibly bacteremic from tunneled HD catheter.  History: DM, ESRD  Home DM Meds: Tresiba 14 units Daily       Ozempic 0.25 mg Qweek  Current Orders: Lantus 10 units Daily      Novolog Resistant Correction Scale/ SSI (0-20 units) Q4 hours     Getting Solucortef 50 mg Q6 hours  Currently NPO/ Intubated  Hemoglobin very low this AM with dark fluid in NG tube/ Shock, likely septic secondary to MRSA bacteremia    MD- Note Lantus 10 units Daily started yesterday afternoon.  CBGs 295/ 289 this AM.  May consider the following adjustments:  Change basal insulin to Levemir and increase to Levemir 12 units BID (0.2 units/kg)  Continue current  Novolog SSI    --Will follow patient during hospitalization--  Wyn Quaker RN, MSN, CDE Diabetes Coordinator Inpatient Glycemic Control Team Team Pager: [phone] (8a-5p)

## 2020-01-06 NOTE — Progress Notes (Signed)
eLink Physician-Brief Progress Note Patient Name: Seth [name] DOB: 08/14/1966 MRN: [id number]   Date of Service  01/06/2020  HPI/Events of Note  NG tube dark chocolate with Hg 6.7, ESRD. baseline 7.5. Protonix bolus and gtt ordered. NG clamped, was on wall suction. On Vent for 24 hrs. Septic emboli.    eICU Interventions  1 PRBC. Follow Hg post GI consultation for AM.      Intervention Category Intermediate Interventions: Bleeding - evaluation and treatment with blood products  Elmer Sow 01/06/2020, 5:20 AM

## 2020-01-07 LAB — BPAM RBC
Blood Product Expiration Date: 202104242359
ISSUE DATE / TIME: 202103290617
Unit Type and Rh: 7300

## 2020-01-07 LAB — CULTURE, BLOOD (ROUTINE X 2)
Culture: NO GROWTH
Culture: NO GROWTH
Special Requests: ADEQUATE
Special Requests: ADEQUATE

## 2020-01-07 LAB — BODY FLUID CULTURE

## 2020-01-07 LAB — TYPE AND SCREEN
ABO/RH(D): B POS
Antibody Screen: NEGATIVE
Unit division: 0

## 2020-01-08 DIAGNOSIS — Z992 Dependence on renal dialysis: Secondary | ICD-10-CM | POA: Diagnosis not present

## 2020-01-08 DIAGNOSIS — N186 End stage renal disease: Secondary | ICD-10-CM | POA: Diagnosis not present

## 2020-01-08 LAB — CULTURE, RESPIRATORY W GRAM STAIN

## 2020-01-09 NOTE — Progress Notes (Signed)
Pt. on comfort care expired 04-Feb-2020 with TOD @ 0332. Death confirmed by Vivien Rossetti, RN and this RN. All vital signs, pulses, and heart sounds absent. Night CCM provider notified. Sister present at bedside. Approximately 128ml of morphine gtt wasted by Moinique, RN and this RN.

## 2020-01-09 NOTE — Procedures (Signed)
Extubation Procedure Note  Patient Details:   Name: Seth [name] DOB: 1965-12-01 MRN: [id number]   Airway Documentation:    Vent end date: 01/16/2020 Vent end time: 0304   Evaluation  O2 sats: currently acceptable Complications: No apparent complications Patient did not tolerate procedure well. Bilateral Breath Sounds: Clear, Diminished   No  Patient terminally extubated.  Carrington Clamp A Jan 16, 2020, 3:12 AM

## 2020-01-09 DEATH — deceased

## 2020-01-10 LAB — CULTURE, BLOOD (ROUTINE X 2)
Culture: NO GROWTH
Special Requests: ADEQUATE
Special Requests: ADEQUATE

## 2020-01-15 ENCOUNTER — Ambulatory Visit: Payer: Medicare Other | Admitting: Licensed Clinical Social Worker

## 2020-01-29 ENCOUNTER — Ambulatory Visit: Payer: Medicare Other | Admitting: Licensed Clinical Social Worker

## 2020-02-19 DIAGNOSIS — I2690 Septic pulmonary embolism without acute cor pulmonale: Secondary | ICD-10-CM | POA: Diagnosis present

## 2020-02-19 DIAGNOSIS — M009 Pyogenic arthritis, unspecified: Secondary | ICD-10-CM | POA: Diagnosis present

## 2020-02-19 DIAGNOSIS — K922 Gastrointestinal hemorrhage, unspecified: Secondary | ICD-10-CM | POA: Diagnosis not present

## 2020-03-10 NOTE — Death Summary Note (Signed)
  DEATH SUMMARY   Patient Details  Name: Seth [name] MRN: [id number] DOB: Dec 31, 1965  Admission/Discharge Information   Admit Date:  01/19/20  Date of Death: Date of Death: Jan 31, 2020  Time of Death: Time of Death: 0332  Length of Stay: Jan 12, 2023  Referring Physician: No primary care provider on file.   Reason(s) for Hospitalization  Septic shock  Diagnoses  Preliminary cause of death:   Septic shock  Secondary Diagnoses (including complications and co-morbidities):  Principal Problem:   MRSA bacteremia Active Problems:   Hyperlipidemia associated with type 2 diabetes mellitus ([name])   ESRD (end stage renal disease) on dialysis ([name])   Absolute anemia   DM (diabetes mellitus) type 2, uncontrolled, with ketoacidosis ([name])   Hyperkalemia   Hyponatremia   Septic shock ([name])   Sepsis due to infected intravenous catheter ([name])   Wrist pain, acute, left   Acute septic pulmonary embolism [facility])   Brief Hospital Course (including significant findings, care, treatment, and services provided and events leading to death)  Seth [name] was a 54 y.o. year old male  with a history of end-stage renal disease for over 10 years through his fistula.  He additionally was having difficulty tolerating dialysis and had a PermCath placed.  He presented with altered mental status hypotension and was being treated for MRSA bacteremia.  He was transferred to the ICU earlier this admission for acute metabolic encephalopathy and required intubation.  He had refused TEE.  His CT chest demonstrates septic emboli.  His right knee culture is positive for staph aureus.  He is an ongoing septic shock. He developed bloody NG tube output and acute drop in hemoglobin.  Given the onset of this new problem and his failure to recover from his sepsis, the difficulty in consistently tolerating hemodialysis, decision was made it was consistent with the patient's wishes to transition to comfort care, withdraw  mechanical ventilation.  This was done on 3/29.  He expired on 2020/01/31.    Seth [name] 02/19/2020, 3:24 PM
